# Patient Record
Sex: Female | Born: 1970
Health system: Southern US, Community
[De-identification: ages and names within clinical notes are randomized; demographics above are authoritative.]

## PROBLEM LIST (undated history)

## (undated) DIAGNOSIS — F329 Major depressive disorder, single episode, unspecified: Secondary | ICD-10-CM

## (undated) DIAGNOSIS — Z8669 Personal history of other diseases of the nervous system and sense organs: Secondary | ICD-10-CM

## (undated) DIAGNOSIS — K56609 Unspecified intestinal obstruction, unspecified as to partial versus complete obstruction: Secondary | ICD-10-CM

## (undated) DIAGNOSIS — I639 Cerebral infarction, unspecified: Secondary | ICD-10-CM

## (undated) DIAGNOSIS — R0602 Shortness of breath: Secondary | ICD-10-CM

## (undated) DIAGNOSIS — N189 Chronic kidney disease, unspecified: Secondary | ICD-10-CM

## (undated) DIAGNOSIS — R51 Headache: Secondary | ICD-10-CM

## (undated) DIAGNOSIS — I10 Essential (primary) hypertension: Secondary | ICD-10-CM

## (undated) DIAGNOSIS — G8929 Other chronic pain: Secondary | ICD-10-CM

## (undated) DIAGNOSIS — R600 Localized edema: Secondary | ICD-10-CM

## (undated) DIAGNOSIS — N809 Endometriosis, unspecified: Secondary | ICD-10-CM

## (undated) DIAGNOSIS — G4730 Sleep apnea, unspecified: Secondary | ICD-10-CM

## (undated) DIAGNOSIS — M79604 Pain in right leg: Secondary | ICD-10-CM

## (undated) DIAGNOSIS — K432 Incisional hernia without obstruction or gangrene: Principal | ICD-10-CM

## (undated) DIAGNOSIS — D649 Anemia, unspecified: Secondary | ICD-10-CM

## (undated) DIAGNOSIS — Z91018 Allergy to other foods: Secondary | ICD-10-CM

## (undated) DIAGNOSIS — E785 Hyperlipidemia, unspecified: Secondary | ICD-10-CM

## (undated) DIAGNOSIS — Z993 Dependence on wheelchair: Secondary | ICD-10-CM

## (undated) DIAGNOSIS — K439 Ventral hernia without obstruction or gangrene: Secondary | ICD-10-CM

## (undated) DIAGNOSIS — F419 Anxiety disorder, unspecified: Secondary | ICD-10-CM

## (undated) DIAGNOSIS — K760 Fatty (change of) liver, not elsewhere classified: Secondary | ICD-10-CM

## (undated) DIAGNOSIS — F32A Depression, unspecified: Secondary | ICD-10-CM

## (undated) DIAGNOSIS — Z5189 Encounter for other specified aftercare: Secondary | ICD-10-CM

## (undated) DIAGNOSIS — G8110 Spastic hemiplegia affecting unspecified side: Secondary | ICD-10-CM

## (undated) DIAGNOSIS — IMO0001 Reserved for inherently not codable concepts without codable children: Secondary | ICD-10-CM

## (undated) DIAGNOSIS — M549 Dorsalgia, unspecified: Secondary | ICD-10-CM

## (undated) DIAGNOSIS — E1140 Type 2 diabetes mellitus with diabetic neuropathy, unspecified: Secondary | ICD-10-CM

## (undated) DIAGNOSIS — G2581 Restless legs syndrome: Secondary | ICD-10-CM

## (undated) DIAGNOSIS — K219 Gastro-esophageal reflux disease without esophagitis: Secondary | ICD-10-CM

## (undated) DIAGNOSIS — I6330 Cerebral infarction due to thrombosis of unspecified cerebral artery: Secondary | ICD-10-CM

## (undated) DIAGNOSIS — R531 Weakness: Secondary | ICD-10-CM

## (undated) DIAGNOSIS — F410 Panic disorder [episodic paroxysmal anxiety] without agoraphobia: Secondary | ICD-10-CM

## (undated) DIAGNOSIS — Z9889 Other specified postprocedural states: Secondary | ICD-10-CM

## (undated) DIAGNOSIS — K5900 Constipation, unspecified: Secondary | ICD-10-CM

## (undated) DIAGNOSIS — T7840XA Allergy, unspecified, initial encounter: Secondary | ICD-10-CM

## (undated) DIAGNOSIS — M75101 Unspecified rotator cuff tear or rupture of right shoulder, not specified as traumatic: Secondary | ICD-10-CM

## (undated) DIAGNOSIS — K319 Disease of stomach and duodenum, unspecified: Secondary | ICD-10-CM

## (undated) DIAGNOSIS — R21 Rash and other nonspecific skin eruption: Secondary | ICD-10-CM

## (undated) DIAGNOSIS — Z87898 Personal history of other specified conditions: Secondary | ICD-10-CM

## (undated) HISTORY — PX: CARDIAC CATHETERIZATION: SHX172

## (undated) HISTORY — PX: EYE SURGERY: SHX253

## (undated) HISTORY — DX: Fatty (change of) liver, not elsewhere classified: K76.0

## (undated) HISTORY — DX: Weakness: R53.1

## (undated) HISTORY — DX: Personal history of other diseases of the nervous system and sense organs: Z86.69

## (undated) HISTORY — DX: Major depressive disorder, single episode, unspecified: F32.9

## (undated) HISTORY — DX: Localized edema: R60.0

## (undated) HISTORY — DX: Gastro-esophageal reflux disease without esophagitis: K21.9

## (undated) HISTORY — DX: Shortness of breath: R06.02

## (undated) HISTORY — DX: Other specified postprocedural states: Z98.890

## (undated) HISTORY — PX: COLONOSCOPY: SHX174

## (undated) HISTORY — PX: UTERINE FIBROID SURGERY: SHX826

## (undated) HISTORY — DX: Encounter for other specified aftercare: Z51.89

## (undated) HISTORY — DX: Constipation, unspecified: K59.00

## (undated) HISTORY — DX: Unspecified intestinal obstruction, unspecified as to partial versus complete obstruction: K56.609

## (undated) HISTORY — DX: Personal history of other specified conditions: Z87.898

## (undated) HISTORY — PX: UPPER GASTROINTESTINAL ENDOSCOPY: SHX188

## (undated) HISTORY — DX: Essential (primary) hypertension: I10

## (undated) HISTORY — DX: Cerebral infarction, unspecified: I63.9

## (undated) HISTORY — DX: Allergy to other foods: Z91.018

## (undated) HISTORY — DX: Depression, unspecified: F32.A

## (undated) HISTORY — DX: Anxiety disorder, unspecified: F41.9

## (undated) HISTORY — DX: Panic disorder (episodic paroxysmal anxiety): F41.0

## (undated) HISTORY — DX: Incisional hernia without obstruction or gangrene: K43.2

## (undated) HISTORY — DX: Cerebral infarction due to thrombosis of unspecified cerebral artery: I63.30

## (undated) HISTORY — PX: DIAGNOSTIC LAPAROSCOPY: SUR761

## (undated) HISTORY — PX: URETER REVISION: SHX493

## (undated) HISTORY — DX: Reserved for inherently not codable concepts without codable children: IMO0001

## (undated) HISTORY — DX: Anemia, unspecified: D64.9

## (undated) HISTORY — DX: Disease of stomach and duodenum, unspecified: K31.9

## (undated) HISTORY — DX: Spastic hemiplegia affecting unspecified side: G81.10

## (undated) HISTORY — DX: Allergy, unspecified, initial encounter: T78.40XA

---

## 2004-03-06 ENCOUNTER — Other Ambulatory Visit: Admission: RE | Admit: 2004-03-06 | Discharge: 2004-03-06 | Payer: Self-pay | Admitting: Obstetrics and Gynecology

## 2004-04-04 ENCOUNTER — Emergency Department (HOSPITAL_COMMUNITY): Admission: EM | Admit: 2004-04-04 | Discharge: 2004-04-04 | Payer: Self-pay | Admitting: Emergency Medicine

## 2004-04-06 ENCOUNTER — Inpatient Hospital Stay (HOSPITAL_COMMUNITY): Admission: EM | Admit: 2004-04-06 | Discharge: 2004-04-08 | Payer: Self-pay | Admitting: Emergency Medicine

## 2004-04-08 ENCOUNTER — Encounter (INDEPENDENT_AMBULATORY_CARE_PROVIDER_SITE_OTHER): Payer: Self-pay | Admitting: Cardiology

## 2004-11-21 ENCOUNTER — Emergency Department (HOSPITAL_COMMUNITY): Admission: EM | Admit: 2004-11-21 | Discharge: 2004-11-21 | Payer: Self-pay | Admitting: Emergency Medicine

## 2004-11-21 ENCOUNTER — Encounter: Admission: RE | Admit: 2004-11-21 | Discharge: 2005-02-19 | Payer: Self-pay | Admitting: Endocrinology

## 2004-12-03 ENCOUNTER — Ambulatory Visit (HOSPITAL_COMMUNITY): Admission: RE | Admit: 2004-12-03 | Discharge: 2004-12-03 | Payer: Self-pay | Admitting: *Deleted

## 2004-12-17 ENCOUNTER — Ambulatory Visit: Payer: Self-pay

## 2004-12-17 ENCOUNTER — Encounter: Payer: Self-pay | Admitting: Cardiovascular Disease

## 2005-07-26 ENCOUNTER — Emergency Department: Payer: Self-pay | Admitting: Emergency Medicine

## 2006-01-05 ENCOUNTER — Other Ambulatory Visit: Payer: Self-pay

## 2006-01-05 ENCOUNTER — Emergency Department: Payer: Self-pay | Admitting: Emergency Medicine

## 2006-04-20 ENCOUNTER — Ambulatory Visit (HOSPITAL_COMMUNITY): Admission: RE | Admit: 2006-04-20 | Discharge: 2006-04-20 | Payer: Self-pay | Admitting: Gastroenterology

## 2006-05-14 ENCOUNTER — Emergency Department: Payer: Self-pay | Admitting: Unknown Physician Specialty

## 2006-06-04 ENCOUNTER — Encounter: Admission: RE | Admit: 2006-06-04 | Discharge: 2006-06-04 | Payer: Self-pay | Admitting: Endocrinology

## 2006-08-16 ENCOUNTER — Other Ambulatory Visit: Payer: Self-pay

## 2006-08-16 ENCOUNTER — Emergency Department: Payer: Self-pay

## 2006-09-30 ENCOUNTER — Emergency Department: Payer: Self-pay | Admitting: Emergency Medicine

## 2007-01-17 ENCOUNTER — Other Ambulatory Visit: Payer: Self-pay

## 2007-01-17 ENCOUNTER — Emergency Department: Payer: Self-pay | Admitting: Emergency Medicine

## 2007-06-05 ENCOUNTER — Emergency Department: Payer: Self-pay | Admitting: Emergency Medicine

## 2007-07-28 ENCOUNTER — Inpatient Hospital Stay (HOSPITAL_COMMUNITY): Admission: AD | Admit: 2007-07-28 | Discharge: 2007-07-28 | Payer: Self-pay | Admitting: Gynecology

## 2007-09-25 ENCOUNTER — Inpatient Hospital Stay (HOSPITAL_COMMUNITY): Admission: AD | Admit: 2007-09-25 | Discharge: 2007-09-25 | Payer: Self-pay | Admitting: Obstetrics and Gynecology

## 2007-12-23 ENCOUNTER — Emergency Department: Payer: Self-pay | Admitting: Emergency Medicine

## 2008-01-19 ENCOUNTER — Encounter: Admission: RE | Admit: 2008-01-19 | Discharge: 2008-01-19 | Payer: Self-pay | Admitting: Endocrinology

## 2008-01-20 ENCOUNTER — Inpatient Hospital Stay (HOSPITAL_COMMUNITY): Admission: AD | Admit: 2008-01-20 | Discharge: 2008-01-20 | Payer: Self-pay | Admitting: Obstetrics and Gynecology

## 2008-04-28 ENCOUNTER — Other Ambulatory Visit: Payer: Self-pay | Admitting: Obstetrics

## 2008-04-28 ENCOUNTER — Emergency Department (HOSPITAL_COMMUNITY): Admission: EM | Admit: 2008-04-28 | Discharge: 2008-04-29 | Payer: Self-pay | Admitting: Emergency Medicine

## 2008-07-08 ENCOUNTER — Emergency Department: Payer: Self-pay | Admitting: Emergency Medicine

## 2009-03-08 ENCOUNTER — Emergency Department (HOSPITAL_COMMUNITY): Admission: EM | Admit: 2009-03-08 | Discharge: 2009-03-09 | Payer: Self-pay | Admitting: Emergency Medicine

## 2009-06-13 DIAGNOSIS — I6330 Cerebral infarction due to thrombosis of unspecified cerebral artery: Secondary | ICD-10-CM

## 2009-06-13 HISTORY — DX: Cerebral infarction due to thrombosis of unspecified cerebral artery: I63.30

## 2009-06-16 ENCOUNTER — Ambulatory Visit: Payer: Self-pay | Admitting: Internal Medicine

## 2009-06-16 ENCOUNTER — Inpatient Hospital Stay (HOSPITAL_COMMUNITY): Admission: EM | Admit: 2009-06-16 | Discharge: 2009-06-21 | Payer: Self-pay | Admitting: Emergency Medicine

## 2009-06-16 ENCOUNTER — Encounter (INDEPENDENT_AMBULATORY_CARE_PROVIDER_SITE_OTHER): Payer: Self-pay | Admitting: Internal Medicine

## 2009-06-18 ENCOUNTER — Encounter (INDEPENDENT_AMBULATORY_CARE_PROVIDER_SITE_OTHER): Payer: Self-pay | Admitting: Internal Medicine

## 2009-06-18 ENCOUNTER — Encounter (INDEPENDENT_AMBULATORY_CARE_PROVIDER_SITE_OTHER): Payer: Self-pay | Admitting: Neurology

## 2009-06-18 ENCOUNTER — Ambulatory Visit: Payer: Self-pay | Admitting: Physical Medicine & Rehabilitation

## 2009-06-19 ENCOUNTER — Encounter (INDEPENDENT_AMBULATORY_CARE_PROVIDER_SITE_OTHER): Payer: Self-pay | Admitting: Neurology

## 2009-06-20 ENCOUNTER — Encounter (INDEPENDENT_AMBULATORY_CARE_PROVIDER_SITE_OTHER): Payer: Self-pay | Admitting: Neurology

## 2009-06-21 ENCOUNTER — Inpatient Hospital Stay (HOSPITAL_COMMUNITY)
Admission: RE | Admit: 2009-06-21 | Discharge: 2009-06-28 | Payer: Self-pay | Admitting: Physical Medicine & Rehabilitation

## 2009-06-21 ENCOUNTER — Encounter: Payer: Self-pay | Admitting: Internal Medicine

## 2009-06-21 ENCOUNTER — Ambulatory Visit: Payer: Self-pay | Admitting: Physical Medicine & Rehabilitation

## 2009-07-02 ENCOUNTER — Encounter
Admission: RE | Admit: 2009-07-02 | Discharge: 2009-09-30 | Payer: Self-pay | Admitting: Physical Medicine & Rehabilitation

## 2009-07-26 ENCOUNTER — Encounter
Admission: RE | Admit: 2009-07-26 | Discharge: 2009-10-24 | Payer: Self-pay | Admitting: Physical Medicine & Rehabilitation

## 2009-07-30 ENCOUNTER — Encounter
Admission: RE | Admit: 2009-07-30 | Discharge: 2009-10-12 | Payer: Self-pay | Admitting: Physical Medicine & Rehabilitation

## 2009-08-06 ENCOUNTER — Ambulatory Visit: Payer: Self-pay | Admitting: Physical Medicine & Rehabilitation

## 2009-08-09 ENCOUNTER — Emergency Department (HOSPITAL_COMMUNITY): Admission: EM | Admit: 2009-08-09 | Discharge: 2009-08-10 | Payer: Self-pay | Admitting: Emergency Medicine

## 2009-09-04 ENCOUNTER — Ambulatory Visit: Payer: Self-pay | Admitting: Physical Medicine & Rehabilitation

## 2009-10-02 ENCOUNTER — Ambulatory Visit: Payer: Self-pay | Admitting: Physical Medicine & Rehabilitation

## 2009-10-06 ENCOUNTER — Inpatient Hospital Stay (HOSPITAL_COMMUNITY): Admission: AD | Admit: 2009-10-06 | Discharge: 2009-10-06 | Payer: Self-pay | Admitting: Obstetrics and Gynecology

## 2009-10-06 DIAGNOSIS — O209 Hemorrhage in early pregnancy, unspecified: Secondary | ICD-10-CM

## 2009-11-11 ENCOUNTER — Emergency Department (HOSPITAL_COMMUNITY): Admission: EM | Admit: 2009-11-11 | Discharge: 2009-11-11 | Payer: Self-pay | Admitting: Emergency Medicine

## 2009-11-29 ENCOUNTER — Inpatient Hospital Stay (HOSPITAL_COMMUNITY): Admission: AD | Admit: 2009-11-29 | Discharge: 2009-11-29 | Payer: Self-pay | Admitting: Obstetrics & Gynecology

## 2009-12-19 ENCOUNTER — Encounter
Admission: RE | Admit: 2009-12-19 | Discharge: 2010-02-12 | Payer: Self-pay | Source: Home / Self Care | Attending: Physical Medicine & Rehabilitation | Admitting: Physical Medicine & Rehabilitation

## 2009-12-25 ENCOUNTER — Ambulatory Visit: Payer: Self-pay | Admitting: Physical Medicine & Rehabilitation

## 2010-01-30 ENCOUNTER — Encounter
Admission: RE | Admit: 2010-01-30 | Discharge: 2010-02-12 | Payer: Self-pay | Source: Home / Self Care | Attending: Physical Medicine & Rehabilitation | Admitting: Physical Medicine & Rehabilitation

## 2010-02-05 ENCOUNTER — Ambulatory Visit
Admission: RE | Admit: 2010-02-05 | Discharge: 2010-02-05 | Payer: Self-pay | Source: Home / Self Care | Attending: Physical Medicine & Rehabilitation | Admitting: Physical Medicine & Rehabilitation

## 2010-02-08 ENCOUNTER — Inpatient Hospital Stay (HOSPITAL_COMMUNITY)
Admission: AD | Admit: 2010-02-08 | Discharge: 2010-02-08 | Payer: Self-pay | Source: Home / Self Care | Attending: Obstetrics and Gynecology | Admitting: Obstetrics and Gynecology

## 2010-02-08 LAB — URINE MICROSCOPIC-ADD ON

## 2010-02-08 LAB — URINALYSIS, ROUTINE W REFLEX MICROSCOPIC
Hgb urine dipstick: NEGATIVE
Leukocytes, UA: NEGATIVE
Specific Gravity, Urine: >1.03 — ABNORMAL HIGH (ref 1.005–1.030)
Urine Glucose, Fasting: 250 mg/dL — AB
Urobilinogen, UA: 0.2 mg/dL (ref 0.0–1.0)

## 2010-02-14 ENCOUNTER — Inpatient Hospital Stay (HOSPITAL_COMMUNITY)
Admission: AD | Admit: 2010-02-14 | Discharge: 2010-02-14 | Disposition: A | Discharge status: Home / Self Care | Payer: Federal, State, Local not specified - PPO | Source: Ambulatory Visit | Attending: Family Medicine | Admitting: Family Medicine

## 2010-02-14 DIAGNOSIS — T783XXA Angioneurotic edema, initial encounter: Secondary | ICD-10-CM

## 2010-02-14 DIAGNOSIS — R809 Proteinuria, unspecified: Secondary | ICD-10-CM

## 2010-02-14 DIAGNOSIS — R821 Myoglobinuria: Secondary | ICD-10-CM

## 2010-02-14 DIAGNOSIS — E119 Type 2 diabetes mellitus without complications: Secondary | ICD-10-CM

## 2010-02-14 DIAGNOSIS — I10 Essential (primary) hypertension: Secondary | ICD-10-CM

## 2010-02-14 DIAGNOSIS — X58XXXA Exposure to other specified factors, initial encounter: Secondary | ICD-10-CM | POA: Insufficient documentation

## 2010-03-08 ENCOUNTER — Encounter: Payer: Federal, State, Local not specified - PPO | Attending: Physical Medicine & Rehabilitation

## 2010-03-08 ENCOUNTER — Ambulatory Visit: Payer: Federal, State, Local not specified - PPO | Admitting: Physical Medicine & Rehabilitation

## 2010-03-08 DIAGNOSIS — F3289 Other specified depressive episodes: Secondary | ICD-10-CM | POA: Insufficient documentation

## 2010-03-08 DIAGNOSIS — I6330 Cerebral infarction due to thrombosis of unspecified cerebral artery: Secondary | ICD-10-CM

## 2010-03-08 DIAGNOSIS — G8110 Spastic hemiplegia affecting unspecified side: Secondary | ICD-10-CM

## 2010-03-08 DIAGNOSIS — R209 Unspecified disturbances of skin sensation: Secondary | ICD-10-CM

## 2010-03-08 DIAGNOSIS — M62838 Other muscle spasm: Secondary | ICD-10-CM | POA: Insufficient documentation

## 2010-03-08 DIAGNOSIS — K5900 Constipation, unspecified: Secondary | ICD-10-CM | POA: Insufficient documentation

## 2010-03-08 DIAGNOSIS — F329 Major depressive disorder, single episode, unspecified: Secondary | ICD-10-CM | POA: Insufficient documentation

## 2010-03-08 DIAGNOSIS — E785 Hyperlipidemia, unspecified: Secondary | ICD-10-CM | POA: Insufficient documentation

## 2010-03-08 DIAGNOSIS — E119 Type 2 diabetes mellitus without complications: Secondary | ICD-10-CM | POA: Insufficient documentation

## 2010-03-08 DIAGNOSIS — R61 Generalized hyperhidrosis: Secondary | ICD-10-CM | POA: Insufficient documentation

## 2010-03-08 DIAGNOSIS — I69959 Hemiplegia and hemiparesis following unspecified cerebrovascular disease affecting unspecified side: Secondary | ICD-10-CM | POA: Insufficient documentation

## 2010-03-26 LAB — COMPREHENSIVE METABOLIC PANEL
ALT: 12 U/L (ref 0–35)
AST: 14 U/L (ref 0–37)
Alkaline Phosphatase: 36 U/L — ABNORMAL LOW (ref 39–117)
CO2: 22 mEq/L (ref 19–32)
Chloride: 105 mEq/L (ref 96–112)
GFR calc Af Amer: >60 mL/min (ref >60)
GFR calc non Af Amer: >60 mL/min (ref >60)
Glucose, Bld: 174 mg/dL — ABNORMAL HIGH (ref 70–99)
Potassium: 3.7 mEq/L (ref 3.5–5.1)
Sodium: 135 mEq/L (ref 135–145)
Total Bilirubin: <0.3 mg/dL — ABNORMAL LOW (ref 0.3–1.2)

## 2010-03-26 LAB — CBC
HCT: 30.8 % — ABNORMAL LOW (ref 36.0–46.0)
Hemoglobin: 10.2 g/dL — ABNORMAL LOW (ref 12.0–15.0)
MCHC: 33.1 g/dL (ref 30.0–36.0)
RBC: 3.98 MIL/uL (ref 3.87–5.11)

## 2010-03-26 LAB — URINALYSIS, ROUTINE W REFLEX MICROSCOPIC
Bilirubin Urine: NEGATIVE
Ketones, ur: NEGATIVE mg/dL
Nitrite: NEGATIVE
pH: 6 (ref 5.0–8.0)

## 2010-03-26 LAB — URINE MICROSCOPIC-ADD ON

## 2010-03-27 LAB — POCT CARDIAC MARKERS
Myoglobin, poc: 33.5 ng/mL (ref 12–200)
Troponin i, poc: <0.05 ng/mL (ref 0.00–0.09)

## 2010-03-27 LAB — DIFFERENTIAL
Basophils Absolute: 0 10*3/uL (ref 0.0–0.1)
Basophils Relative: 0 % (ref 0–1)
Lymphocytes Relative: 19 % (ref 12–46)
Monocytes Absolute: 0.5 10*3/uL (ref 0.1–1.0)
Neutro Abs: 6.9 10*3/uL (ref 1.7–7.7)

## 2010-03-27 LAB — CBC
HCT: 31.2 % — ABNORMAL LOW (ref 36.0–46.0)
MCHC: 33.3 g/dL (ref 30.0–36.0)
Platelets: 316 10*3/uL (ref 150–400)
RDW: 18.5 % — ABNORMAL HIGH (ref 11.5–15.5)
WBC: 9.3 10*3/uL (ref 4.0–10.5)

## 2010-03-27 LAB — POCT I-STAT, CHEM 8
BUN: 9 mg/dL (ref 6–23)
Calcium, Ion: 1.2 mmol/L (ref 1.12–1.32)
Chloride: 106 mEq/L (ref 96–112)
HCT: 33 % — ABNORMAL LOW (ref 36.0–46.0)
Sodium: 139 mEq/L (ref 135–145)
TCO2: 21 mmol/L (ref 0–100)

## 2010-03-28 LAB — CBC
HCT: 31.9 % — ABNORMAL LOW (ref 36.0–46.0)
MCHC: 32.7 g/dL (ref 30.0–36.0)
MCV: 74.1 fL — ABNORMAL LOW (ref 78.0–100.0)
Platelets: 318 10*3/uL (ref 150–400)
RDW: 19.3 % — ABNORMAL HIGH (ref 11.5–15.5)

## 2010-03-30 LAB — URINALYSIS, ROUTINE W REFLEX MICROSCOPIC
Glucose, UA: 100 mg/dL — AB
Ketones, ur: NEGATIVE mg/dL
Protein, ur: NEGATIVE mg/dL
Urobilinogen, UA: 0.2 mg/dL (ref 0.0–1.0)

## 2010-03-30 LAB — POCT PREGNANCY, URINE: Preg Test, Ur: NEGATIVE

## 2010-03-30 LAB — CBC
MCH: 22.9 pg — ABNORMAL LOW (ref 26.0–34.0)
MCHC: 32.5 g/dL (ref 30.0–36.0)
MCV: 70.6 fL — ABNORMAL LOW (ref 78.0–100.0)
Platelets: 375 10*3/uL (ref 150–400)
RDW: 19 % — ABNORMAL HIGH (ref 11.5–15.5)

## 2010-03-30 LAB — BASIC METABOLIC PANEL
BUN: 8 mg/dL (ref 6–23)
CO2: 24 mEq/L (ref 19–32)
Calcium: 8.6 mg/dL (ref 8.4–10.5)
Chloride: 103 mEq/L (ref 96–112)
Creatinine, Ser: 0.6 mg/dL (ref 0.4–1.2)

## 2010-03-30 LAB — DIFFERENTIAL
Basophils Absolute: 0.1 10*3/uL (ref 0.0–0.1)
Basophils Relative: 1 % (ref 0–1)
Eosinophils Absolute: 0.5 10*3/uL (ref 0.0–0.7)
Eosinophils Relative: 6 % — ABNORMAL HIGH (ref 0–5)

## 2010-03-30 LAB — GLUCOSE, CAPILLARY: Glucose-Capillary: 193 mg/dL — ABNORMAL HIGH (ref 70–99)

## 2010-03-31 LAB — GLUCOSE, CAPILLARY: Glucose-Capillary: 168 mg/dL — ABNORMAL HIGH (ref 70–99)

## 2010-04-01 LAB — ANTITHROMBIN III: AntiThromb III Func: 110 % (ref 76–126)

## 2010-04-01 LAB — URINALYSIS, ROUTINE W REFLEX MICROSCOPIC
Bilirubin Urine: NEGATIVE
Glucose, UA: 250 mg/dL — AB
Hgb urine dipstick: NEGATIVE
Ketones, ur: NEGATIVE mg/dL
Leukocytes, UA: NEGATIVE
Protein, ur: 100 mg/dL — AB
pH: 6.5 (ref 5.0–8.0)

## 2010-04-01 LAB — COMPREHENSIVE METABOLIC PANEL
ALT: 11 U/L (ref 0–35)
ALT: 15 U/L (ref 0–35)
AST: 14 U/L (ref 0–37)
AST: 19 U/L (ref 0–37)
AST: 31 U/L (ref 0–37)
Albumin: 2.8 g/dL — ABNORMAL LOW (ref 3.5–5.2)
Albumin: 3 g/dL — ABNORMAL LOW (ref 3.5–5.2)
Albumin: 3.1 g/dL — ABNORMAL LOW (ref 3.5–5.2)
Alkaline Phosphatase: 45 U/L (ref 39–117)
Alkaline Phosphatase: 52 U/L (ref 39–117)
BUN: 12 mg/dL (ref 6–23)
Calcium: 7.9 mg/dL — ABNORMAL LOW (ref 8.4–10.5)
Chloride: 108 mEq/L (ref 96–112)
Chloride: 108 mEq/L (ref 96–112)
Creatinine, Ser: 0.5 mg/dL (ref 0.4–1.2)
GFR calc Af Amer: >60 mL/min (ref >60)
GFR calc Af Amer: >60 mL/min (ref >60)
Glucose, Bld: 208 mg/dL — ABNORMAL HIGH (ref 70–99)
Potassium: 3.6 mEq/L (ref 3.5–5.1)
Potassium: 4.1 mEq/L (ref 3.5–5.1)
Potassium: 4.4 mEq/L (ref 3.5–5.1)
Sodium: 137 mEq/L (ref 135–145)
Total Bilirubin: 0.4 mg/dL (ref 0.3–1.2)
Total Protein: 6.4 g/dL (ref 6.0–8.3)
Total Protein: 6.7 g/dL (ref 6.0–8.3)

## 2010-04-01 LAB — CBC
HCT: 31.6 % — ABNORMAL LOW (ref 36.0–46.0)
Hemoglobin: 10 g/dL — ABNORMAL LOW (ref 12.0–15.0)
MCHC: 31.4 g/dL (ref 30.0–36.0)
Platelets: 376 10*3/uL (ref 150–400)
RDW: 19.7 % — ABNORMAL HIGH (ref 11.5–15.5)
WBC: 7.8 10*3/uL (ref 4.0–10.5)

## 2010-04-01 LAB — GLUCOSE, CAPILLARY
Glucose-Capillary: 149 mg/dL — ABNORMAL HIGH (ref 70–99)
Glucose-Capillary: 190 mg/dL — ABNORMAL HIGH (ref 70–99)
Glucose-Capillary: 197 mg/dL — ABNORMAL HIGH (ref 70–99)
Glucose-Capillary: 198 mg/dL — ABNORMAL HIGH (ref 70–99)
Glucose-Capillary: 206 mg/dL — ABNORMAL HIGH (ref 70–99)
Glucose-Capillary: 212 mg/dL — ABNORMAL HIGH (ref 70–99)
Glucose-Capillary: 214 mg/dL — ABNORMAL HIGH (ref 70–99)
Glucose-Capillary: 224 mg/dL — ABNORMAL HIGH (ref 70–99)
Glucose-Capillary: 228 mg/dL — ABNORMAL HIGH (ref 70–99)
Glucose-Capillary: 229 mg/dL — ABNORMAL HIGH (ref 70–99)
Glucose-Capillary: 234 mg/dL — ABNORMAL HIGH (ref 70–99)
Glucose-Capillary: 235 mg/dL — ABNORMAL HIGH (ref 70–99)
Glucose-Capillary: 238 mg/dL — ABNORMAL HIGH (ref 70–99)
Glucose-Capillary: 242 mg/dL — ABNORMAL HIGH (ref 70–99)
Glucose-Capillary: 249 mg/dL — ABNORMAL HIGH (ref 70–99)
Glucose-Capillary: 249 mg/dL — ABNORMAL HIGH (ref 70–99)
Glucose-Capillary: 250 mg/dL — ABNORMAL HIGH (ref 70–99)
Glucose-Capillary: 253 mg/dL — ABNORMAL HIGH (ref 70–99)
Glucose-Capillary: 276 mg/dL — ABNORMAL HIGH (ref 70–99)
Glucose-Capillary: 280 mg/dL — ABNORMAL HIGH (ref 70–99)
Glucose-Capillary: 290 mg/dL — ABNORMAL HIGH (ref 70–99)
Glucose-Capillary: 291 mg/dL — ABNORMAL HIGH (ref 70–99)
Glucose-Capillary: 305 mg/dL — ABNORMAL HIGH (ref 70–99)
Glucose-Capillary: 249 mg/dL — ABNORMAL HIGH (ref 70–99)

## 2010-04-01 LAB — SEDIMENTATION RATE: Sed Rate: 33 mm/hr — ABNORMAL HIGH (ref 0–22)

## 2010-04-01 LAB — RETICULOCYTES
Retic Count, Absolute: 68.9 10*3/uL (ref 19.0–186.0)
Retic Ct Pct: 1.5 % (ref 0.4–3.1)

## 2010-04-01 LAB — LIPID PANEL
Cholesterol: 225 mg/dL — ABNORMAL HIGH (ref 0–200)
LDL Cholesterol: 145 mg/dL — ABNORMAL HIGH (ref 0–99)
Total CHOL/HDL Ratio: 4.6 RATIO

## 2010-04-01 LAB — BASIC METABOLIC PANEL
CO2: 23 mEq/L (ref 19–32)
Calcium: 9 mg/dL (ref 8.4–10.5)
Creatinine, Ser: 0.68 mg/dL (ref 0.4–1.2)
GFR calc Af Amer: >60 mL/min (ref >60)
GFR calc non Af Amer: >60 mL/min (ref >60)
Glucose, Bld: 261 mg/dL — ABNORMAL HIGH (ref 70–99)
Potassium: 4.2 mEq/L (ref 3.5–5.1)
Sodium: 136 mEq/L (ref 135–145)

## 2010-04-01 LAB — RAPID URINE DRUG SCREEN, HOSP PERFORMED
Amphetamines: NOT DETECTED
Benzodiazepines: POSITIVE — AB
Cocaine: NOT DETECTED
Opiates: NOT DETECTED
Tetrahydrocannabinol: NOT DETECTED

## 2010-04-01 LAB — LUPUS ANTICOAGULANT PANEL: DRVVT: 39.8 secs (ref 36.2–44.3)

## 2010-04-01 LAB — COMPLEMENT, TOTAL: Compl, Total (CH50): 60 U/mL (ref 31–60)

## 2010-04-01 LAB — VITAMIN B12: Vitamin B-12: 289 pg/mL (ref 211–911)

## 2010-04-01 LAB — CARDIAC PANEL(CRET KIN+CKTOT+MB+TROPI)
Relative Index: INVALID (ref 0.0–2.5)
Troponin I: 0.02 ng/mL (ref 0.00–0.06)

## 2010-04-01 LAB — DIFFERENTIAL
Basophils Relative: 1 % (ref 0–1)
Eosinophils Absolute: 0.2 10*3/uL (ref 0.0–0.7)
Lymphocytes Relative: 27 % (ref 12–46)
Lymphs Abs: 1.8 10*3/uL (ref 0.7–4.0)
Neutro Abs: 3.8 10*3/uL (ref 1.7–7.7)

## 2010-04-01 LAB — FACTOR 5 LEIDEN

## 2010-04-01 LAB — CARDIOLIPIN ANTIBODIES, IGG, IGM, IGA
Anticardiolipin IgA: 8 APL U/mL — ABNORMAL LOW (ref <22)
Anticardiolipin IgG: 3 GPL U/mL — ABNORMAL LOW (ref <23)

## 2010-04-01 LAB — HOMOCYSTEINE: Homocysteine: 7.9 umol/L (ref 4.0–15.4)

## 2010-04-01 LAB — PROTIME-INR: Prothrombin Time: 13.4 seconds (ref 11.6–15.2)

## 2010-04-01 LAB — C3 COMPLEMENT: C3 Complement: 185 mg/dL (ref 88–201)

## 2010-04-01 LAB — PROTEIN S, TOTAL: Protein S Ag, Total: 119 % (ref 70–140)

## 2010-04-01 LAB — IRON AND TIBC
Saturation Ratios: 10 % — ABNORMAL LOW (ref 20–55)
TIBC: 319 ug/dL (ref 250–470)
UIBC: 288 ug/dL

## 2010-04-01 LAB — FOLATE: Folate: >20 ng/mL

## 2010-04-01 LAB — HEMOGLOBIN A1C: Hgb A1c MFr Bld: 11.7 % — ABNORMAL HIGH (ref <5.7)

## 2010-04-01 LAB — ANA: Anti Nuclear Antibody(ANA): NEGATIVE

## 2010-04-01 LAB — C4 COMPLEMENT: Complement C4, Body Fluid: 46 mg/dL (ref 16–47)

## 2010-04-05 LAB — GLUCOSE, CAPILLARY

## 2010-04-05 LAB — DIFFERENTIAL
Lymphs Abs: 0.4 10*3/uL — ABNORMAL LOW (ref 0.7–4.0)
Monocytes Relative: 3 % (ref 3–12)
Neutro Abs: 11.2 10*3/uL — ABNORMAL HIGH (ref 1.7–7.7)
Neutrophils Relative %: 94 % — ABNORMAL HIGH (ref 43–77)

## 2010-04-05 LAB — CBC
MCV: 72.5 fL — ABNORMAL LOW (ref 78.0–100.0)
RBC: 4.73 MIL/uL (ref 3.87–5.11)
WBC: 11.9 10*3/uL — ABNORMAL HIGH (ref 4.0–10.5)

## 2010-04-05 LAB — BASIC METABOLIC PANEL
Calcium: 8.8 mg/dL (ref 8.4–10.5)
Chloride: 103 mEq/L (ref 96–112)
Creatinine, Ser: 0.7 mg/dL (ref 0.4–1.2)
GFR calc Af Amer: >60 mL/min (ref >60)

## 2010-04-05 NOTE — Assessment & Plan Note (Signed)
REASON FOR VISIT:  Balance problems, right-sided weakness, spasms in the right lower extremity.  A 40 year old female, prior history of diabetes, dyslipidemia, was admitted to [facility] with right-sided weakness, slurred speech on June 16, 2009.  She had a left pontine infarct.  She went through inpatient rehabilitation as well as home health therapy as well as outpatient PT and OT.  She is pregnant.  Went into preterm labor and delivered at 25 weeks a 1 pound and 8 ounces neonate.  She had some increased weakness on the right side after her delivery; however, repeat MRI of the brain showed no new strokes.  She was at bedrest for about 2 weeks after her C- section.  Average pain is 4/5, right hemibody tingling, aching, dull pain.  She can walk 10 minutes at a time.  She climbs steps.  She does not drive.  She needs help with bathing, household duties and shopping, otherwise independent.  REVIEW OF SYSTEMS:  Positive of weakness, numbness, spasms, depression, constipation, poor appetite and night sweats.  SOCIAL HISTORY:  Married, lives with her husband.  She is breast-feeding a neonate.  She is doing pumping.  She has other children at home.  PHYSICAL EXAMINATION:  Blood pressure 139/75, respirations 18, pulse 97, O2 sat 100% on room air.  Overweight female in no acute distress. Orientation x3.  Affect is alert.  Gait; she has a slightly widened base support.  She cannot do toe walk or heel walk, but that is mainly due to balance rather than strength deficits in the leg muscles.  She has decreased fine motor in the right upper extremity with decreased finger-to-thumb opposition.  She has 4/5 strength in the right deltoid, biceps, triceps grip, 5/5 of the left side.  In the right lower extremity, she has 4/5 in the hip flexor, knee extensor and ankle dorsiflexor and 5/5 on the left side.  Sensation is reduced on the right side to pinprick in the upper and lower extremities.  She has  positive dysdiadochokinesis on rapid alternating, supination and pronation of the right upper extremity.  She has no evidence of ataxia on finger-nose-finger testing.  IMPRESSION:  Left pontine infarct with right hemiparesis.  She has spasticity in the right quad, which is intermittent.  Because of breast- feeding, we cannot her start on antispasticity medications.  This is not a good location for Botox injections either.  We can instruct her in a quad stretching program.  I will see her back in 4 months.  Encouraged her to keep up with home exercise program as well as a walking activities.  Discussed with the patient and her husband, agreed with plan.     Erick Colace, M.D. Electronically Signed    AEK/MedQ D:  03/08/2010 16:26:38  T:  03/08/2010 21:35:08  Job #:  [id number]

## 2010-04-24 LAB — POCT CARDIAC MARKERS
Myoglobin, poc: 40.6 ng/mL (ref 12–200)
Troponin i, poc: <0.05 ng/mL (ref 0.00–0.09)

## 2010-04-24 LAB — CBC
HCT: 34.4 % — ABNORMAL LOW (ref 36.0–46.0)
Hemoglobin: 11.1 g/dL — ABNORMAL LOW (ref 12.0–15.0)
MCV: 74.4 fL — ABNORMAL LOW (ref 78.0–100.0)
RBC: 4.62 MIL/uL (ref 3.87–5.11)
WBC: 6.6 10*3/uL (ref 4.0–10.5)

## 2010-04-24 LAB — POCT I-STAT, CHEM 8
BUN: 9 mg/dL (ref 6–23)
Calcium, Ion: 1.18 mmol/L (ref 1.12–1.32)
Creatinine, Ser: 0.6 mg/dL (ref 0.4–1.2)
TCO2: 24 mmol/L (ref 0–100)

## 2010-04-24 LAB — DIFFERENTIAL
Eosinophils Absolute: 0.2 10*3/uL (ref 0.0–0.7)
Eosinophils Relative: 2 % (ref 0–5)
Lymphs Abs: 2.1 10*3/uL (ref 0.7–4.0)
Monocytes Relative: 7 % (ref 3–12)

## 2010-04-24 LAB — GLUCOSE, CAPILLARY: Glucose-Capillary: 253 mg/dL — ABNORMAL HIGH (ref 70–99)

## 2010-04-29 LAB — URINALYSIS, ROUTINE W REFLEX MICROSCOPIC
Ketones, ur: NEGATIVE mg/dL
Leukocytes, UA: NEGATIVE
Nitrite: NEGATIVE
Urobilinogen, UA: 0.2 mg/dL (ref 0.0–1.0)
pH: 6.5 (ref 5.0–8.0)

## 2010-04-29 LAB — CBC
HCT: 33.1 % — ABNORMAL LOW (ref 36.0–46.0)
Hemoglobin: 10.6 g/dL — ABNORMAL LOW (ref 12.0–15.0)
MCHC: 32.1 g/dL (ref 30.0–36.0)
MCV: 71.7 fL — ABNORMAL LOW (ref 78.0–100.0)
Platelets: 422 10*3/uL — ABNORMAL HIGH (ref 150–400)
RDW: 17.4 % — ABNORMAL HIGH (ref 11.5–15.5)

## 2010-04-29 LAB — BASIC METABOLIC PANEL
BUN: 9 mg/dL (ref 6–23)
CO2: 24 mEq/L (ref 19–32)
Chloride: 103 mEq/L (ref 96–112)
GFR calc non Af Amer: >60 mL/min (ref >60)
Glucose, Bld: 327 mg/dL — ABNORMAL HIGH (ref 70–99)
Potassium: 4.3 mEq/L (ref 3.5–5.1)

## 2010-04-29 LAB — URINE MICROSCOPIC-ADD ON

## 2010-04-29 LAB — WET PREP, GENITAL: Clue Cells Wet Prep HPF POC: NONE SEEN

## 2010-05-31 NOTE — Op Note (Signed)
NAME:  Tina [name], Tina [name]            ACCOUNT NO.:  [id number]   MEDICAL RECORD NO.:  [id number]          PATIENT TYPE:  AMB   LOCATION:  ENDO                         FACILITY:  [facility]   PHYSICIAN:  Georgiana Spinner, M.D.    DATE OF BIRTH:  [birth year]   DATE OF PROCEDURE:  DATE OF DISCHARGE:                               OPERATIVE REPORT   PROCEDURE:  Colonoscopy.   INDICATIONS:  Rectal bleeding.   ANESTHESIA:  Demerol 40 mg, Versed 5 mg.   PROCEDURE:  With the patient mildly sedated in the left lateral  decubitus position, the Pentax videoscopic colonoscope was inserted in  the rectum and passed under direct vision to the cecum, identified by  the ileocecal valve and base of cecum, both of which were photographed.  From this point, the colonoscope was slowly withdrawn, taking  circumferential views of the colonic mucosa, suctioning yellow stool,  liquid in nature, from the colon as we proceeded until we reached the  rectum which appeared normal on direct and showed hemorrhoids on  retroflexed view.  The endoscope was straightened and withdrawn.  The  patient's vital signs and pulse oximeter remained stable.  The patient  tolerated the procedure well without apparent complications.   FINDINGS:  Internal hemorrhoids; otherwise, unremarkable examination.   PLAN:  Have patient follow up with me as an outpatient.           ______________________________  Georgiana Spinner, M.D.     GMO/MEDQ  D:  04/20/2006  T:  04/20/2006  Job:  [id number]

## 2010-05-31 NOTE — H&P (Signed)
NAMECYBIL, Tina [name]            ACCOUNT NO.:  [id number]   MEDICAL RECORD NO.:  [id number]          PATIENT TYPE:  INP   LOCATION:  5727                         FACILITY:  [facility]   PHYSICIAN:  Lonia Blood, M.D.      DATE OF BIRTH:  [birth year]   DATE OF ADMISSION:  04/05/2004  DATE OF DISCHARGE:                                HISTORY & PHYSICAL   PRIMARY CARE PHYSICIAN:  Dr. Juleen China.   PRESENTING COMPLAINT:  Fever, nausea, vomiting, and dysuria.   HISTORY OF PRESENT ILLNESS:  This is a 40 year old African American female  with a history of diabetes for more than ten years, a history of vesicle  ureteral reflux, status post repair almost ten years ago.  The patient  presented to the ED, on April 04, 2004, with fever and generalized malaise.  Workup showed the patient had evidence of UTI.  She was nauseated at the  time and was given oral antibiotics.  The patient went home and started  having vomiting.  She has been unable to keep the antibiotics down.  She has  also continued to be weak, so she returned to the emergency room on April 05, 2004 p.m.  The patient complained of having a fever of 106 at home also.  While in the ED, the patient was found to be weak and looks dehydrated.  She  is deemed to have failed outpatient antibiotic therapy.   PAST MEDICAL HISTORY:  1.  Diabetes type 2.  2.  Hypertension.  3.  History of endometriosis.  4.  History of fibroid uterus.  5.  History of obesity.  6.  History of vesicle ureteral reflux, status post repair.  7.  Dyslipidemia.  8.  Obesity.   ALLERGIES:  The patient is allergic to:  1.  SEAFOOD.  2.  IV DYE.   MEDICATIONS:  1.  Lotrel 10/20 one tablet daily.  2.  Actos 45 mg daily.  3.  Metoprolol 50 mg daily.  4.  Phentermine 30 mg daily for weight loss.   SOCIAL HISTORY:  The patient lives here in [place] with her husband.  Denied any tobacco or alcohol use.  The patient has been fairly active,  trying to lose weight.   She has lost 10 pounds in the past two weeks.  She  is currently undergoing physician assisted weight loss program.   FAMILY HISTORY:  Significant for heart disease in her grandparents and  diabetes in her father.   REVIEW OF SYSTEMS:  Essentially as in HPI.  Other systems reviewed and are  normal.   PHYSICAL EXAMINATION:  VITAL SIGNS:  Temperature is 103.4, blood pressure  146/95, pulse of 118.  GENERAL:  The patient is acutely ill looking but in no acute distress.  HEENT:  PERRL.  EOMI.  NECK:  Supple.  No JVD.  No lymphadenopathy.  CHEST:  Clear to auscultation bilaterally.  No wheezing.  No rales.  CARDIOVASCULAR:  The patient is slightly tachycardic.  ABDOMEN:  Obese, nontender with positive bowel sounds.  EXTREMITIES:  Show no edema, cyanosis, or clubbing.   Her labs showed  a white count of 8.9, hemoglobin 11.1 with an MCV of 75.8,  normal differential.  Sodium is 132, potassium 3.8, chloride 100, CO2 25,  glucose 260, BUN 7, creatinine 0.9, calcium 8.6.  Total protein 6.7, albumin  3.1.  AST 30, ALT 13, alkaline phosphatase 45, total bilirubin is 0.4.  Lipase is 20.  Urine culture and blood cultures are currently pending.   ASSESSMENT:  1.  This is a 40 year old female with possible pyelonephritis going by      fever, costovertebral angle tenderness, and evidence of pyuria from her      urinalysis.  The patient seems to have failed outpatient therapy.  We      will, therefore, admit the patient for at least 23-hour observation,      start her on some IV antibiotics and transition her to oral antibiotics.      The patient has not vomited since arrival at the ER, but she is still      nauseated.  We will control her nausea appropriately.  We will also      hydrate her to a place where she could be most stable.  2.  For her diabetes, I will check a hemoglobin A1c.  I will continue her on      Actos and add sliding scale insulin.  3.  For her hypertension, I will continue with  Lotrel and metoprolol as per      her home dose.  We will also cover her at this point with broad spectrum      with Cipro as well as Rocephin.  I will use Rocephin on top of the Cipro      pending the culture results.  This is because of the resistant isolates      of Cipro in this hospital.  If the patient improves within the next 23      hours, we will discharge the patient, otherwise the patient will have to      be converted into full admission after 23 hours.      LG/MEDQ  D:  04/06/2004  T:  04/07/2004  Job:  [id number]

## 2010-05-31 NOTE — Op Note (Signed)
NAME:  Tina [name], Tina [name]            ACCOUNT NO.:  [id number]   MEDICAL RECORD NO.:  [id number]          PATIENT TYPE:  AMB   LOCATION:  ENDO                         FACILITY:  [facility]   PHYSICIAN:  Georgiana Spinner, M.D.    DATE OF BIRTH:  1970/11/08   DATE OF PROCEDURE:  04/20/2006  DATE OF DISCHARGE:                               OPERATIVE REPORT   PROCEDURE:  Upper endoscopy.   INDICATIONS:  Abdominal pain.   ANESTHESIA:  Demerol 70 mg, Versed 7.5 mg.   PROCEDURE:  With patient mildly sedated in the left lateral decubitus  position, the Pentax videoscopic endoscope was inserted in the mouth,  passed under direct vision through the esophagus, which appeared normal  into the stomach, fundus, body, and antrum.  Duodenal bulb and second  portion of the duodenum all appeared normal.  From this point, the  endoscope was slowly withdrawn, taking circumferential views of the  duodenal mucosa until the endoscope had been pulled back into the  stomach, placed in retroflexion, and viewed the stomach from below.  The  endoscope was straightened and withdrawn, taking circumferential views  of the remaining gastric and esophageal mucosa.  The patient's vital  signs and pulse oximeter remained stable.  The patient tolerated the  procedure well without apparent complications.   FINDINGS:  Unremarkable examination.   PLAN:  Proceed to colonoscopy.           ______________________________  Georgiana Spinner, M.D.     GMO/MEDQ  D:  04/20/2006  T:  04/20/2006  Job:  [id number]

## 2010-05-31 NOTE — Op Note (Signed)
NAME:  Tina [name], Tina [name]            ACCOUNT NO.:  [id number]   MEDICAL RECORD NO.:  [id number]          PATIENT TYPE:  AMB   LOCATION:  ENDO                         FACILITY:  [facility]   PHYSICIAN:  Georgiana Spinner, M.D.    DATE OF BIRTH:  09/30/1970   DATE OF PROCEDURE:  12/03/2004  DATE OF DISCHARGE:                                 OPERATIVE REPORT   PROCEDURES:  Colonoscopy.   INDICATIONS:  Rectal bleeding.   ANESTHESIA:  Demerol 60 mg, Versed 8 mg.   PROCEDURE:  With the patient mildly sedated in the left lateral decubitus  position, the Olympus videoscopic colonoscope was inserted in the rectum and  passed under direct vision.  With pressure applied, we reached the cecum,  identified by ileocecal valve and appendiceal orifice; both which were  photographed.  From this point, the colonoscope was slowly withdrawn, taking  circumferential views of colonic mucosa; stopping in the rectum which  appeared normal and direct showed hemorrhoids on retroflexed view.  The  endoscope was straightened and withdrawn. The patient's vital signs, pulse  oximetry remained stable. The patient tolerated the procedure well without  apparent complication.   FINDINGS:  Internal hemorrhoids, otherwise an unremarkable colonoscopic  examination to the cecum. Of note, the perineum was normal.   PLAN:  Have the patient follow up with me on an as-needed basis.           ______________________________  Georgiana Spinner, M.D.     GMO/MEDQ  D:  12/03/2004  T:  12/03/2004  Job:  [id number]

## 2010-05-31 NOTE — Discharge Summary (Signed)
NAMEMarland Kitchen  Tina [name], Tina [name]            ACCOUNT NO.:  [id number]   MEDICAL RECORD NO.:  [id number]          PATIENT TYPE:  INP   LOCATION:  5727                         FACILITY:  [facility]   PHYSICIAN:  Gertha Calkin, M.D.DATE OF BIRTH:  01-Jun-1970   DATE OF ADMISSION:  04/05/2004  DATE OF DISCHARGE:  04/08/2004                                 DISCHARGE SUMMARY   PRIMARY CARE PHYSICIAN:  Dr. Juleen China.   DISCHARGE DIAGNOSES:  1.  Complicated urinary tract infection.  2.  Intractable nausea and vomiting.  3.  Hypertension.  4.  Obesity.  5.  Diabetes.  6.  Dyslipidemia.   DISCHARGE MEDICATIONS:  1.  Resume home medications which include Actos, Lotrel, metoprolol, and her      birth control pill.  2.  New medication:  Keflex 500 mg p.o. b.i.d. x11 days.   DIAGNOSTIC TESTS:  1.  CT of chest negative for clot.  2.  2-D echocardiogram shows an EF of 55-65%.  3.  EKG:  Normal sinus rhythm, normal EKG, normal intervals, normal axes.   HOSPITAL COURSE:  Please see H&P for details of admission.   Problem 1. Complicated urinary tract infection.  The patient came in and  started on antibiotics.  Nausea and symptoms were controlled with IV  antiemetics.  The patient remained afebrile throughout the hospitalization,  was tolerating p.o. and switched to p.o. medications and antibiotics on day  2.  Culture results from urine returned on the day of discharge were  resistant to ciprofloxacin, therefore, I discharged her on Keflex to be  taken for a total course of 12 days of antibiotics.  Currently, her  abdominal pain and nausea symptoms have resolved and she is tolerating a  p.o. diet and remains afebrile.  No white count throughout hospitalization.   Problem 2. Chest pain/shortness of breath.  The patient with risk factor of  obesity and being on birth control pills recently started and questionable  long travel history to [place], however, CT of the chest returned negative  for any clots.  It  was an inadequate study for peripheral clots but would  not correlate with her symptoms.  EKG showed no acute changes consistent  with ischemia and enzymes were negative x3.  At this time, most likely this  is either habitus related plus or minus GI and/or asthma component.  This  can be worked in an outpatient setting.  On the day of discharge the patient  is no longer having chest pain or shortness of breath symptoms that she was  complaining about the previous day.  Her O2 saturations have remained 97%  plus.   Her other medical issues remained stable and no changes in her medications  were made during this hospitalization and to be resumed on discharge.   DISCHARGE LABS:  Total cholesterol 115, triglycerides 121, HDL 30, LDL 61.  BMET was normal.  Hemoglobin 9.9 and there was some microcytosis, however,  hemoglobin was stable as were her hemodynamics, therefore, this can be  worked up in an outpatient setting as well.   Followup needs to be with Dr. Juleen China in  about 7 to 10 days and further  treatment to be done in an outpatient setting.      JD/MEDQ  D:  04/08/2004  T:  04/08/2004  Job:  [id number]   cc:   Brooke Bonito, M.D.  [address]  [place]  [place] [address]  Fax: [phone]

## 2010-07-09 ENCOUNTER — Encounter: Payer: Federal, State, Local not specified - PPO | Attending: Physical Medicine & Rehabilitation

## 2010-07-09 ENCOUNTER — Ambulatory Visit: Payer: Federal, State, Local not specified - PPO | Admitting: Physical Medicine & Rehabilitation

## 2010-07-09 DIAGNOSIS — E119 Type 2 diabetes mellitus without complications: Secondary | ICD-10-CM | POA: Insufficient documentation

## 2010-07-09 DIAGNOSIS — R0602 Shortness of breath: Secondary | ICD-10-CM | POA: Insufficient documentation

## 2010-07-09 DIAGNOSIS — I6330 Cerebral infarction due to thrombosis of unspecified cerebral artery: Secondary | ICD-10-CM

## 2010-07-09 DIAGNOSIS — I10 Essential (primary) hypertension: Secondary | ICD-10-CM | POA: Insufficient documentation

## 2010-07-09 DIAGNOSIS — M79609 Pain in unspecified limb: Secondary | ICD-10-CM | POA: Insufficient documentation

## 2010-07-09 DIAGNOSIS — R29898 Other symptoms and signs involving the musculoskeletal system: Secondary | ICD-10-CM | POA: Insufficient documentation

## 2010-07-09 DIAGNOSIS — F341 Dysthymic disorder: Secondary | ICD-10-CM | POA: Insufficient documentation

## 2010-07-09 DIAGNOSIS — I69959 Hemiplegia and hemiparesis following unspecified cerebrovascular disease affecting unspecified side: Secondary | ICD-10-CM | POA: Insufficient documentation

## 2010-07-09 DIAGNOSIS — IMO0002 Reserved for concepts with insufficient information to code with codable children: Secondary | ICD-10-CM

## 2010-07-09 DIAGNOSIS — R112 Nausea with vomiting, unspecified: Secondary | ICD-10-CM | POA: Insufficient documentation

## 2010-07-09 DIAGNOSIS — R42 Dizziness and giddiness: Secondary | ICD-10-CM | POA: Insufficient documentation

## 2010-07-09 DIAGNOSIS — R109 Unspecified abdominal pain: Secondary | ICD-10-CM | POA: Insufficient documentation

## 2010-07-09 DIAGNOSIS — M62838 Other muscle spasm: Secondary | ICD-10-CM | POA: Insufficient documentation

## 2010-07-09 DIAGNOSIS — R209 Unspecified disturbances of skin sensation: Secondary | ICD-10-CM | POA: Insufficient documentation

## 2010-07-09 DIAGNOSIS — G8110 Spastic hemiplegia affecting unspecified side: Secondary | ICD-10-CM

## 2010-07-10 NOTE — Assessment & Plan Note (Signed)
A 40 year old female with history of left pontine stroke.  She has had previous strokes as well several years ago.  She has chief complaint of balance problems, right-sided weakness.  She has newer complaint of increasing pain going down the right leg as well as some numbness and tingling type discomfort.  She has onset about 2-3 weeks ago, no trauma. She has had about 3 falls since I saw her back in February, but none of which really resulted in any significant trauma.  She has had right calf muscle spasms, had a brain MRI last week at Dr. Marlis Edelson office, but does not have the results back yet.  Her average pain is in the 4/10 range, described as dull, stabbing, tingling, and aching.  The tingling is mainly in the right lower extremity.  Her sleep is poor.  She has her pain worse with walking, sitting, standing.  She ambulates without assistance.  She climbs steps.  She is not driving currently.  She was having some problems with depth perception and Dr. Pearlean Brownie, told her to quit driving.  She also reportedly underwent the MRI scan to further evaluate.  REVIEW OF SYSTEMS:  Positive for numbness and tingling in the right lower extremity, trouble walking, spasms, dizziness, confusion, depression, anxiety, abdominal pain, nausea, vomiting, some swelling, and shortness of breath.  Please see health and history form for further details.  PRIMARY CARE PHYSICIAN:  Dr. [name] Needle.  PAST MEDICAL HISTORY:  Significant for diabetes and hypertension.  SOCIAL HISTORY:  Married, lives with her husband and her children. Denies any drug or alcohol use or smoking.  PHYSICAL EXAMINATION:  VITAL SIGNS:  Blood pressure 150/86, pulse 91, respirations 16, O2 sat 100% on room air. GENERAL:  Overweight African American female in no acute distress. MUSCULOSKELETAL:  Her gait shows no evidence of toe drag or knee instability.  She has a widened base support.  She has 4/5 strength in the right deltoid  biceps, triceps grip, 5/5 in left side, and right lower extremity 4/5 in hip flexion, knee extension, ankle dorsiflexor, and 5/5 in left side.  Sensation reduced on the right side to pinprick in the right upper extremity as well as right lower extremity with exception of L4 and L5 dermatomes which are actually more sensitive on the left side than on the right side.  Her deep tendon reflexes are hyperactive on the right side 3, and normal on the left at 2.  IMPRESSION:  Left pontine infarct with right hemiparesis.  She has some sensory deficits and she may have stroke-related dysesthesias for which we will still restart her on gabapentin.  She is now no longer breast feeding.  We will also restart on tramadol and change it to 50 mg b.i.d.  In addition, we will have physical therapy work with their home health environment.  She no longer drives because of some visual problems.  I recommended followup with Ophthalmology to further assess.  I will see her back in 1 month, if not much better, we may consider lumbosacral MRI given that she has decreased sensation at L4 and L5 dermatomal distribution as well as back pain that radiates down to the right lower extremity, once again complicated situation given stroke with similar sensory and motor deficits.  Discussed with patient, agrees with plan.  I will see her in 1 month.     Erick Colace, M.D. Electronically Signed    AEK/MedQ D:  07/09/2010 15:23:09  T:  07/10/2010 01:14:54  Job #:  [id number] 

## 2010-08-12 ENCOUNTER — Encounter: Payer: Federal, State, Local not specified - PPO | Attending: Physical Medicine & Rehabilitation

## 2010-08-12 ENCOUNTER — Ambulatory Visit: Payer: Federal, State, Local not specified - PPO | Admitting: Physical Medicine & Rehabilitation

## 2010-08-12 DIAGNOSIS — R42 Dizziness and giddiness: Secondary | ICD-10-CM | POA: Insufficient documentation

## 2010-08-12 DIAGNOSIS — F341 Dysthymic disorder: Secondary | ICD-10-CM | POA: Insufficient documentation

## 2010-08-12 DIAGNOSIS — R0602 Shortness of breath: Secondary | ICD-10-CM | POA: Insufficient documentation

## 2010-08-12 DIAGNOSIS — R112 Nausea with vomiting, unspecified: Secondary | ICD-10-CM | POA: Insufficient documentation

## 2010-08-12 DIAGNOSIS — I69959 Hemiplegia and hemiparesis following unspecified cerebrovascular disease affecting unspecified side: Secondary | ICD-10-CM | POA: Insufficient documentation

## 2010-08-12 DIAGNOSIS — R209 Unspecified disturbances of skin sensation: Secondary | ICD-10-CM | POA: Insufficient documentation

## 2010-08-12 DIAGNOSIS — E119 Type 2 diabetes mellitus without complications: Secondary | ICD-10-CM | POA: Insufficient documentation

## 2010-08-12 DIAGNOSIS — M76899 Other specified enthesopathies of unspecified lower limb, excluding foot: Secondary | ICD-10-CM

## 2010-08-12 DIAGNOSIS — I10 Essential (primary) hypertension: Secondary | ICD-10-CM | POA: Insufficient documentation

## 2010-08-12 DIAGNOSIS — I6330 Cerebral infarction due to thrombosis of unspecified cerebral artery: Secondary | ICD-10-CM

## 2010-08-12 DIAGNOSIS — R109 Unspecified abdominal pain: Secondary | ICD-10-CM | POA: Insufficient documentation

## 2010-08-12 DIAGNOSIS — R29898 Other symptoms and signs involving the musculoskeletal system: Secondary | ICD-10-CM | POA: Insufficient documentation

## 2010-08-12 DIAGNOSIS — M79609 Pain in unspecified limb: Secondary | ICD-10-CM | POA: Insufficient documentation

## 2010-08-12 DIAGNOSIS — M62838 Other muscle spasm: Secondary | ICD-10-CM | POA: Insufficient documentation

## 2010-08-12 NOTE — Assessment & Plan Note (Signed)
REASON FOR VISIT:  Right-sided body pain.  HISTORY:  A 40 year old female with prior history of CVA causing right hemiparesis, left pontine.  She has been restarted on gabapentin last visit for poststroke paresthesias, this was on July 09, 2010.  She has had no new medical problems in the interval time period, but has had increasing pain in the right hip to the point where she really cannot even lay on that side, pain is rated 6-7/10, worse at nighttime when she is trying to sleep, walking, sitting, standing also seem to aggravate. She does have some radiation in the pain to her knee and below toward the calf.  She has some difficulty with bathing, household duties, shopping, numbness, tremor, tingling, trouble walking, spasms, dizziness, confusion, depression, anxiety are all positive on review of systems.  PAST MEDICAL HISTORY:  Diabetes, hypertension.  SOCIAL HISTORY:  Married, has 2 infants at home.  PHYSICAL EXAMINATION:  VITAL SIGNS:  Blood pressure 140/67, pulse 88, respirations 16, and O2 sat 99% on room air. GENERAL:  No acute distress.  Mood and affect appropriate.  And affect alert. MUSCULOSKELETAL:  She has no evidence of sensory deficits on the left side.  She does have some decreased sensation in the right S1 dermatomal distribution, but she has hyperactive reflexes in the right side in the biceps, triceps, brachioradialis, knee, and ankle.  Left side has decreased deep tendon reflex at the ankle.  Her hip has no pain with range of motion, but has some pain to direct palpation over the greater trochanter.  IMPRESSION: 1. Increasing right hip pain.  I think this is a trochanteric     bursitis.  We will inject today since she is a poor candidate for     NSAIDs given her diabetes, hypertension, recent strokes. 2. Poststroke paresthesia, continue gabapentin and tramadol. 3. I will see her back in 1 month.     Erick Colace, M.D. Electronically  Signed    AEK/MedQ D:  08/12/2010 16:41:51  T:  08/12/2010 19:19:47  Job #:  [id number]

## 2010-09-10 ENCOUNTER — Encounter: Payer: Federal, State, Local not specified - PPO | Attending: Physical Medicine & Rehabilitation

## 2010-09-10 ENCOUNTER — Ambulatory Visit: Payer: Federal, State, Local not specified - PPO | Admitting: Physical Medicine & Rehabilitation

## 2010-09-10 DIAGNOSIS — M62838 Other muscle spasm: Secondary | ICD-10-CM | POA: Insufficient documentation

## 2010-09-10 DIAGNOSIS — I69959 Hemiplegia and hemiparesis following unspecified cerebrovascular disease affecting unspecified side: Secondary | ICD-10-CM | POA: Insufficient documentation

## 2010-09-10 DIAGNOSIS — G8110 Spastic hemiplegia affecting unspecified side: Secondary | ICD-10-CM

## 2010-09-10 NOTE — Assessment & Plan Note (Signed)
REASON FOR VISIT:  Right-sided weakness.  HISTORY:  A 40 year old female with history of right hemapheresis due to CVA.  She is having increased difficulty with falls.  She has been told not to drive because she is having difficulty judging distances.  She does have some spasms that wake her up at night.  She is not taking any spasm medicines.  She does take some tramadol 50 nightly.  She has had no new medical problems other than those described above.  She remains on gabapentin 300 t.i.d.  Social; continues to help care for her 2-year-old and 75-month-old.  She does have a nephew that stays with her that helps to take the kids up and down the steps to the second floor.  Blood pressure 139/77, pulse 86, respirations 18 and O2 sat 99% on room air.  INTERVAL MEDICAL HISTORY:  Dr. Pearlean Brownie has re-evaluated the patient's carotid Dopplers and these are reportedly no change compared to prior. Please see health and history form for additional review of systems otherwise negative.  She does have 4/5 strength in right deltoid, biceps, triceps, grip as well as right hip flexion, knee extensor, but only 3- at ankle dorsiflexor.  She ambulates with some foot drag on the right side.  No knee instability.  She does have some clonus at the right ankle.  IMPRESSION:  Right spastic hemiplegia due to left cerebrovascular accident.  PLAN: 1. We will start some tizanidine 4 mg nightly.  We will continue     tramadol 50 nightly. 2. I will see her back in 1 month, may consider daytime tizanidine.     May need to consider botulinum toxin injection as well.  Discussed     with the patient, agrees with plan.     Erick Colace, M.D. Electronically Signed    AEK/MedQ D:  09/10/2010 16:24:38  T:  09/10/2010 20:55:13  Job #:  [id number]

## 2010-09-16 ENCOUNTER — Emergency Department (HOSPITAL_COMMUNITY): Payer: Federal, State, Local not specified - PPO

## 2010-09-16 ENCOUNTER — Emergency Department (HOSPITAL_COMMUNITY)
Admission: EM | Admit: 2010-09-16 | Discharge: 2010-09-16 | Disposition: A | Discharge status: Home / Self Care | Payer: Federal, State, Local not specified - PPO | Attending: Emergency Medicine | Admitting: Emergency Medicine

## 2010-09-16 DIAGNOSIS — T148XXA Other injury of unspecified body region, initial encounter: Secondary | ICD-10-CM | POA: Insufficient documentation

## 2010-09-16 DIAGNOSIS — E119 Type 2 diabetes mellitus without complications: Secondary | ICD-10-CM | POA: Insufficient documentation

## 2010-09-16 DIAGNOSIS — E7800 Pure hypercholesterolemia, unspecified: Secondary | ICD-10-CM | POA: Insufficient documentation

## 2010-09-16 DIAGNOSIS — M25569 Pain in unspecified knee: Secondary | ICD-10-CM | POA: Insufficient documentation

## 2010-09-16 DIAGNOSIS — R296 Repeated falls: Secondary | ICD-10-CM | POA: Insufficient documentation

## 2010-09-16 DIAGNOSIS — I10 Essential (primary) hypertension: Secondary | ICD-10-CM | POA: Insufficient documentation

## 2010-09-16 DIAGNOSIS — Z8673 Personal history of transient ischemic attack (TIA), and cerebral infarction without residual deficits: Secondary | ICD-10-CM | POA: Insufficient documentation

## 2010-09-16 DIAGNOSIS — Z794 Long term (current) use of insulin: Secondary | ICD-10-CM | POA: Insufficient documentation

## 2010-09-16 DIAGNOSIS — Z8742 Personal history of other diseases of the female genital tract: Secondary | ICD-10-CM | POA: Insufficient documentation

## 2010-09-16 DIAGNOSIS — M25579 Pain in unspecified ankle and joints of unspecified foot: Secondary | ICD-10-CM | POA: Insufficient documentation

## 2010-09-16 DIAGNOSIS — M545 Low back pain, unspecified: Secondary | ICD-10-CM | POA: Insufficient documentation

## 2010-09-16 LAB — POCT PREGNANCY, URINE: Preg Test, Ur: NEGATIVE

## 2010-09-19 ENCOUNTER — Encounter: Payer: Federal, State, Local not specified - PPO | Attending: Physical Medicine & Rehabilitation

## 2010-09-19 ENCOUNTER — Ambulatory Visit: Payer: Federal, State, Local not specified - PPO | Admitting: Physical Medicine & Rehabilitation

## 2010-09-19 DIAGNOSIS — M545 Low back pain, unspecified: Secondary | ICD-10-CM

## 2010-09-19 DIAGNOSIS — F341 Dysthymic disorder: Secondary | ICD-10-CM | POA: Insufficient documentation

## 2010-09-19 DIAGNOSIS — R42 Dizziness and giddiness: Secondary | ICD-10-CM | POA: Insufficient documentation

## 2010-09-19 DIAGNOSIS — R29898 Other symptoms and signs involving the musculoskeletal system: Secondary | ICD-10-CM | POA: Insufficient documentation

## 2010-09-19 DIAGNOSIS — R0602 Shortness of breath: Secondary | ICD-10-CM | POA: Insufficient documentation

## 2010-09-19 DIAGNOSIS — R112 Nausea with vomiting, unspecified: Secondary | ICD-10-CM | POA: Insufficient documentation

## 2010-09-19 DIAGNOSIS — I69959 Hemiplegia and hemiparesis following unspecified cerebrovascular disease affecting unspecified side: Secondary | ICD-10-CM | POA: Insufficient documentation

## 2010-09-19 DIAGNOSIS — R209 Unspecified disturbances of skin sensation: Secondary | ICD-10-CM | POA: Insufficient documentation

## 2010-09-19 DIAGNOSIS — R109 Unspecified abdominal pain: Secondary | ICD-10-CM | POA: Insufficient documentation

## 2010-09-19 DIAGNOSIS — E119 Type 2 diabetes mellitus without complications: Secondary | ICD-10-CM | POA: Insufficient documentation

## 2010-09-19 DIAGNOSIS — G8110 Spastic hemiplegia affecting unspecified side: Secondary | ICD-10-CM

## 2010-09-19 DIAGNOSIS — M62838 Other muscle spasm: Secondary | ICD-10-CM | POA: Insufficient documentation

## 2010-09-19 DIAGNOSIS — I10 Essential (primary) hypertension: Secondary | ICD-10-CM | POA: Insufficient documentation

## 2010-09-19 DIAGNOSIS — M79609 Pain in unspecified limb: Secondary | ICD-10-CM | POA: Insufficient documentation

## 2010-09-23 NOTE — Assessment & Plan Note (Signed)
REASON FOR VISIT:  Back and buttock pain.  HISTORY AND PHYSICAL:  A 40 year old female that I just saw 1 week ago for her right spastic hemiplegia which is chronic due to her left CVA. She has had some falls.  She has had spasms on the right side treated with tizanidine at night.  Her pontine infarct was in June 2011.  She does not use any assistive device.  She has gained some weight.  She has had reevaluation with Dr. Delia Heady, recently and had some carotid Dopplers showing no change compared to prior.  She had a fall on September 3, went into the ED.  She thinks the floor was wet.  She states her pain is about 8/10.  Sleep is poor.  Pain is worse with walking, bending, sitting, standing.  She can climb steps. She does not drive.  She needs help with bathing, household duties, shopping.  REVIEW OF SYSTEMS:  Positive for weakness, numbness, spasms, dizziness, confusion, depression anxiety, nausea, and limb swelling.  PHYSICAL EXAMINATION:  VITAL SIGNS:  Blood pressure 155/74, pulse 99, respirations 18, and O2 sat 96% on room air. GENERAL:  This is an obese female in no acute distress. MUSCULOSKELETAL:  Her right upper extremity strength is 4/5 in the right deltoid, biceps, triceps, grip 5/5 on the left side.  Right lower extremity, she has 4/5 strength in the hip flexor, knee extensor, and 4- in the ankle dorsiflexor, plantar flexor, and 3- in the toe flexors and extensors.  This is stable compared to prior.  I reviewed her x-rays.  Lumbosacral spine complete four views were negative, right ankle which she had been complaining of after the fall was negative.  Left knee was negative.  I looked at the actual films and concur with Radiology interpretation.  She has full range of motion in the knees and ankles.  She has tenderness to palpation bilateral trochanteric bursa area, tenderness in the lumbar paraspinal muscles at the lumbosacral junction.  Negative straight leg raise  testing.  Sensation is mildly reduced in the right lateral toes third, fourth, fifth.  Her neurologic status is stable compared to prior.  Her mentation is intact.  Speech is unchanged.  IMPRESSION: 1. Chronic right spastic hemiplegia due to left pontine CVA.  I think     she needs to get some additional physical therapy.  She does not     drive, so will have home health come in. 2. Increase her tramadol to 50 t.i.d. for 2 weeks. 3. I will see back in 2 weeks, followup on therapy progress.  I have     written a prescription for a cane.  I think she needs to use it at     all time.  I think that over time she may be coming a bit more     deconditioned and needs a therapy tune-up as well as continue home     exercise program.     Erick Colace, M.D. Electronically Signed   AEK/MedQ D:  09/19/2010 13:12:12  T:  09/19/2010 13:38:20  Job #:  [id number]

## 2010-10-02 ENCOUNTER — Emergency Department (HOSPITAL_COMMUNITY)
Admission: EM | Admit: 2010-10-02 | Discharge: 2010-10-02 | Disposition: A | Discharge status: Home / Self Care | Payer: Federal, State, Local not specified - PPO | Attending: Emergency Medicine | Admitting: Emergency Medicine

## 2010-10-02 DIAGNOSIS — E119 Type 2 diabetes mellitus without complications: Secondary | ICD-10-CM | POA: Insufficient documentation

## 2010-10-02 DIAGNOSIS — F411 Generalized anxiety disorder: Secondary | ICD-10-CM | POA: Insufficient documentation

## 2010-10-02 DIAGNOSIS — R064 Hyperventilation: Secondary | ICD-10-CM | POA: Insufficient documentation

## 2010-10-02 DIAGNOSIS — Z79899 Other long term (current) drug therapy: Secondary | ICD-10-CM | POA: Insufficient documentation

## 2010-10-02 LAB — GLUCOSE, CAPILLARY

## 2010-10-04 ENCOUNTER — Ambulatory Visit: Payer: Federal, State, Local not specified - PPO | Admitting: Physical Medicine & Rehabilitation

## 2010-10-04 DIAGNOSIS — F410 Panic disorder [episodic paroxysmal anxiety] without agoraphobia: Secondary | ICD-10-CM

## 2010-10-04 DIAGNOSIS — G8110 Spastic hemiplegia affecting unspecified side: Secondary | ICD-10-CM

## 2010-10-04 NOTE — Assessment & Plan Note (Signed)
HISTORY:  The patient returns today, 6/10 pain, but overall improving in the back.  She had a fall about a month ago, this has improved.  She saw her eye doctor for retinopathy, hospitalized for panic attack related to eye procedure in the office.  She continues have some burning pain on right side of the body.  She gets relief from gabapentin.  She is getting some therapy to help strengthen lower extremities to avoid falls.  She has some hip pain bilaterally left greater than right.  Blood pressure 160/81, pulse 90, respirations 16, and O2 sat 98% on room air.  She plans to follow up Dr. Juleen China, in regards to her back.  Examination, she has 4/5 strength in right side and 5/5 left side.  She has tenderness in the left greater trochanter and left gluteus medius.  IMPRESSION: 1. Right hemiparesis due to cerebrovascular accident. 2. Trochanteric bursitis. 3. Dysesthetic pain.  PLAN: 1. Continue tramadol at night. 2. Gabapentin 300 t.i.d. 3. Continue her tizanidine for spasticity 4 mg nightly.  Discussed with the patient, agrees with plan.  She needs to see Dr. Milagros Evener, from Psychiatry, for panic attacks and make a referral. I will see her back in 6 weeks.     Erick Colace, M.D. Electronically Signed    AEK/MedQ D:  10/04/2010 15:35:23  T:  10/04/2010 09:81:19  Job #:  [id number]

## 2010-10-11 ENCOUNTER — Ambulatory Visit: Payer: Federal, State, Local not specified - PPO | Admitting: Physical Medicine & Rehabilitation

## 2010-10-11 LAB — CBC
HCT: 36.8
MCV: 71 — ABNORMAL LOW
Platelets: 488 — ABNORMAL HIGH
RBC: 5.18 — ABNORMAL HIGH
WBC: 10.2

## 2010-10-11 LAB — WET PREP, GENITAL
Clue Cells Wet Prep HPF POC: NONE SEEN
Yeast Wet Prep HPF POC: NONE SEEN

## 2010-10-11 LAB — URINALYSIS, ROUTINE W REFLEX MICROSCOPIC
Ketones, ur: 15 — AB
Leukocytes, UA: NEGATIVE
Nitrite: NEGATIVE
Specific Gravity, Urine: >1.03 — ABNORMAL HIGH
pH: 5.5

## 2010-10-11 LAB — URINE MICROSCOPIC-ADD ON

## 2010-10-11 LAB — GC/CHLAMYDIA PROBE AMP, GENITAL: Chlamydia, DNA Probe: NEGATIVE

## 2010-10-16 LAB — CBC
HCT: 34.9 — ABNORMAL LOW
Hemoglobin: 11.2 — ABNORMAL LOW
MCHC: 32.1
RDW: 17.5 — ABNORMAL HIGH

## 2010-10-16 LAB — URINALYSIS, ROUTINE W REFLEX MICROSCOPIC
Bilirubin Urine: NEGATIVE
Ketones, ur: NEGATIVE
Nitrite: NEGATIVE
Urobilinogen, UA: 0.2

## 2010-11-11 ENCOUNTER — Ambulatory Visit: Payer: Federal, State, Local not specified - PPO | Admitting: Physical Medicine & Rehabilitation

## 2010-11-11 ENCOUNTER — Encounter: Payer: Federal, State, Local not specified - PPO | Attending: Physical Medicine & Rehabilitation

## 2010-11-11 DIAGNOSIS — M62838 Other muscle spasm: Secondary | ICD-10-CM | POA: Insufficient documentation

## 2010-11-11 DIAGNOSIS — R109 Unspecified abdominal pain: Secondary | ICD-10-CM | POA: Insufficient documentation

## 2010-11-11 DIAGNOSIS — R209 Unspecified disturbances of skin sensation: Secondary | ICD-10-CM | POA: Insufficient documentation

## 2010-11-11 DIAGNOSIS — M79609 Pain in unspecified limb: Secondary | ICD-10-CM | POA: Insufficient documentation

## 2010-11-11 DIAGNOSIS — G8110 Spastic hemiplegia affecting unspecified side: Secondary | ICD-10-CM

## 2010-11-11 DIAGNOSIS — R29898 Other symptoms and signs involving the musculoskeletal system: Secondary | ICD-10-CM | POA: Insufficient documentation

## 2010-11-11 DIAGNOSIS — I10 Essential (primary) hypertension: Secondary | ICD-10-CM | POA: Insufficient documentation

## 2010-11-11 DIAGNOSIS — R112 Nausea with vomiting, unspecified: Secondary | ICD-10-CM | POA: Insufficient documentation

## 2010-11-11 DIAGNOSIS — I6330 Cerebral infarction due to thrombosis of unspecified cerebral artery: Secondary | ICD-10-CM

## 2010-11-11 DIAGNOSIS — R0602 Shortness of breath: Secondary | ICD-10-CM | POA: Insufficient documentation

## 2010-11-11 DIAGNOSIS — E119 Type 2 diabetes mellitus without complications: Secondary | ICD-10-CM | POA: Insufficient documentation

## 2010-11-11 DIAGNOSIS — I69959 Hemiplegia and hemiparesis following unspecified cerebrovascular disease affecting unspecified side: Secondary | ICD-10-CM | POA: Insufficient documentation

## 2010-11-11 DIAGNOSIS — R42 Dizziness and giddiness: Secondary | ICD-10-CM | POA: Insufficient documentation

## 2010-11-11 DIAGNOSIS — F341 Dysthymic disorder: Secondary | ICD-10-CM | POA: Insufficient documentation

## 2010-11-11 NOTE — Assessment & Plan Note (Signed)
REASON FOR VISIT:  Problems with walking and anxiety.  HISTORY:  This is a 40 year old female who has a chronic right hemiparesis due to CVA.  She had her stroke just a few months after giving birth to her twin sons.  After her rehab hospitalization she became pregnant once again and has given birth 8 months ago to another son.  Her last inpatient rehab stay was on June 21, 2009.  She continues to follow with primary care for hypertension, dyslipidemia, and diabetes mellitus type 2.  She has had some chronic headaches, although this has been improved with tramadol.  She does have some chronic right-sided stroke related pain, although this has not been very significant.  She has had some spasticity in the right quadriceps, although this has improved with time.  She has had some trochanteric bursitis as well. She has gone through some home health therapy.  PHYSICAL EXAMINATION:  MUSCULOSKELETAL:  She has 4/5 strength in the right deltoid biceps, triceps, grip, as well as hip flexion, knee extension, ankle dorsiflexor 5/5 on the left side.  Pain score 6/10 mainly on the right hemi body.  Pain is worse with walking, bending, sitting, standing; improves with therapy and medications.  Walking tolerance is 13 minutes.  She is able climb steps.  SOCIAL HISTORY:  Complicated, now over mother-in-law lives with her since June.  She has her sister who is 42 living with her that is graduating college this year.  She has a niece who 4, who is in high school.  In addition, she has a nephew who is 56 year old, who is going to [employer].  REVIEW OF SYSTEMS:  Weakness, spasms, dizziness, confusion, depression, anxiety, nausea, abdominal pain, limb swelling.  PHYSICAL EXAMINATION:  VITAL SIGNS:  Blood pressure 180/87, pulse 95, respirations 16, O2 saturation 99% on room air. GENERAL:  This is an anxious-appearing female, in no acute distress. Mood and affect otherwise appropriate and.  Her gait shows no  evidence toe drag or knee instability.  Slightly widened base support.  IMPRESSION: 1. Chronic right spastic hemiparesis due to a prior cerebrovascular     accident. 2. Poststroke depression as well as some anxiety component I think     Zoloft may be a good choice for her.  We did make a referral to Dr.     Milagros Evener for panic attacks, but the patient never got a call     back, we will send another referral.  In the meantime, start Zoloft     25 mg q.a.m. x7 days and then increase it to 50 mg.  Discussed with     the patient and agrees with plan.     Erick Colace, M.D. Electronically Signed    AEK/MedQ D:  11/11/2010 16:17:13  T:  11/11/2010 20:46:38  Job #:  [id number]

## 2010-12-10 ENCOUNTER — Ambulatory Visit: Payer: Federal, State, Local not specified - PPO | Admitting: Physical Medicine & Rehabilitation

## 2010-12-10 ENCOUNTER — Encounter: Payer: Federal, State, Local not specified - PPO | Attending: Physical Medicine & Rehabilitation

## 2010-12-10 DIAGNOSIS — R29898 Other symptoms and signs involving the musculoskeletal system: Secondary | ICD-10-CM | POA: Insufficient documentation

## 2010-12-10 DIAGNOSIS — I10 Essential (primary) hypertension: Secondary | ICD-10-CM | POA: Insufficient documentation

## 2010-12-10 DIAGNOSIS — F064 Anxiety disorder due to known physiological condition: Secondary | ICD-10-CM

## 2010-12-10 DIAGNOSIS — R209 Unspecified disturbances of skin sensation: Secondary | ICD-10-CM | POA: Insufficient documentation

## 2010-12-10 DIAGNOSIS — R109 Unspecified abdominal pain: Secondary | ICD-10-CM | POA: Insufficient documentation

## 2010-12-10 DIAGNOSIS — F341 Dysthymic disorder: Secondary | ICD-10-CM | POA: Insufficient documentation

## 2010-12-10 DIAGNOSIS — M79609 Pain in unspecified limb: Secondary | ICD-10-CM | POA: Insufficient documentation

## 2010-12-10 DIAGNOSIS — R0602 Shortness of breath: Secondary | ICD-10-CM | POA: Insufficient documentation

## 2010-12-10 DIAGNOSIS — M62838 Other muscle spasm: Secondary | ICD-10-CM | POA: Insufficient documentation

## 2010-12-10 DIAGNOSIS — E119 Type 2 diabetes mellitus without complications: Secondary | ICD-10-CM | POA: Insufficient documentation

## 2010-12-10 DIAGNOSIS — R42 Dizziness and giddiness: Secondary | ICD-10-CM | POA: Insufficient documentation

## 2010-12-10 DIAGNOSIS — I69959 Hemiplegia and hemiparesis following unspecified cerebrovascular disease affecting unspecified side: Secondary | ICD-10-CM | POA: Insufficient documentation

## 2010-12-10 DIAGNOSIS — R112 Nausea with vomiting, unspecified: Secondary | ICD-10-CM | POA: Insufficient documentation

## 2010-12-10 NOTE — Assessment & Plan Note (Signed)
REASON FOR VISIT:  Right leg weakness.  HISTORY:  A 40 year old female status post right CVA, causing chronic right hemiparesis.  Her original stroke was in June 2011.  She completed inpatient rehabilitation and went through outpatient rehabilitation. She resumed independent status, but continues to have right lower extremity weakness.  She has had some headaches and plans to follow up with neurologist for this.  She has had anxiety, depression, and plans to see a psychiatrist.  I have made a referral, but somehow the patient did not get an appointment yet.  She continues to have spasms at night and during the day, was on tizanidine just a 4 mg at night, but she has been splitting in 2, taking 1 tablet during the day.  She does take tramadol, she wished to take it t.i.d. but really she has been taking just at night.  She was started on Zoloft and since starting this, she has felt a little "spacey."  She has had no other new medical problems in the interval time period.  Her average pain is 7/10, current is 5.  She can walk 13 minutes at a time.  She can climbs steps.  She does not drive currently.  She needs some help with bathing, dressing, household duties, and shopping.  She has anxiety, depression.  She has had suicidal thoughts in the past, but none currently, no plan.  Numbness, tingling, spasms, dizziness are in the review of systems.  PHYSICAL EXAMINATION:  VITAL SIGNS:  Blood pressure 144/76, pulse 82, respirations 18, O2 sat 100% on room air.  Weight 241 pounds, height 5 feet 6-1/2 inches. NEUROLOGIC:  No acute stress.  Orientation x3.  Affect alert.  Gait is unstable, but no evidence toe drag or knee instability.  Her right quad strength is 4-/5.  Remainder of muscle groups are 5/5 in the upper and lower extremities with the exception of some mild distal weakness in the right upper extremity.  IMPRESSION:  Chronic right spastic hemiparesis due to left cerebrovascular  accident.  PLAN: 1. She will need to continue some quad strengthening, although I think     at this point, she is plateaued and will always need to be careful     going down steps given that this requires quite a bit of quadriceps     strength given her weight. 2. In terms of her headache, she will follow up with Neurology. 3. In terms her spasms, we will increase her Zanaflex 4 mg b.i.d. 4. We will increase her tramadol to 50 mg t.i.d.  We will stop her     Zoloft and refer to another psychiatrist.     Erick Colace, M.D. Electronically Signed    AEK/MedQ D:  12/10/2010 14:43:12  T:  12/10/2010 22:55:13  Job #:  [id number]  cc:   Pramod P. Pearlean Brownie, MD Fax: [phone]

## 2011-02-04 ENCOUNTER — Ambulatory Visit: Payer: Federal, State, Local not specified - PPO | Admitting: Physical Medicine & Rehabilitation

## 2011-02-04 ENCOUNTER — Encounter: Payer: Federal, State, Local not specified - PPO | Attending: Physical Medicine & Rehabilitation

## 2011-02-04 DIAGNOSIS — R109 Unspecified abdominal pain: Secondary | ICD-10-CM | POA: Insufficient documentation

## 2011-02-04 DIAGNOSIS — R29898 Other symptoms and signs involving the musculoskeletal system: Secondary | ICD-10-CM | POA: Insufficient documentation

## 2011-02-04 DIAGNOSIS — R42 Dizziness and giddiness: Secondary | ICD-10-CM | POA: Insufficient documentation

## 2011-02-04 DIAGNOSIS — F341 Dysthymic disorder: Secondary | ICD-10-CM | POA: Insufficient documentation

## 2011-02-04 DIAGNOSIS — R0602 Shortness of breath: Secondary | ICD-10-CM | POA: Insufficient documentation

## 2011-02-04 DIAGNOSIS — R112 Nausea with vomiting, unspecified: Secondary | ICD-10-CM | POA: Insufficient documentation

## 2011-02-04 DIAGNOSIS — I69959 Hemiplegia and hemiparesis following unspecified cerebrovascular disease affecting unspecified side: Secondary | ICD-10-CM | POA: Insufficient documentation

## 2011-02-04 DIAGNOSIS — I10 Essential (primary) hypertension: Secondary | ICD-10-CM | POA: Insufficient documentation

## 2011-02-04 DIAGNOSIS — R209 Unspecified disturbances of skin sensation: Secondary | ICD-10-CM | POA: Insufficient documentation

## 2011-02-04 DIAGNOSIS — E119 Type 2 diabetes mellitus without complications: Secondary | ICD-10-CM | POA: Insufficient documentation

## 2011-02-04 DIAGNOSIS — G8110 Spastic hemiplegia affecting unspecified side: Secondary | ICD-10-CM

## 2011-02-04 DIAGNOSIS — M62838 Other muscle spasm: Secondary | ICD-10-CM | POA: Insufficient documentation

## 2011-02-04 DIAGNOSIS — M79609 Pain in unspecified limb: Secondary | ICD-10-CM | POA: Insufficient documentation

## 2011-02-04 NOTE — Assessment & Plan Note (Signed)
A 41 year old female, status post left CVA causing chronic right hemiparesis.  Original stroke was in June 2011.  She has completed inpatient rehabilitation, completed outpatient rehabilitation.  Her last visit with me was December 10, 2010.  She has a history of anxiety and depression.  We did refer to a psychiatrist.  There was some up front to see that she has not paid yet and has not gone to see the physician yet. She has been discontinued from her Zoloft.  This made her feel "spacey." She actually feels less anxious than she did last time and in fact has started walking on the treadmill.  She goes about 10 minutes twice a day.  She complains of some leg swelling as well as numbness, tingling after walking.  No increased back pain with walking.  She is on tizanidine 4 mg at night during the day.  It makes her little bit too tired.  SOCIAL HISTORY:  She cares for her 48-year-old as well as her 76-year-old twins.  Her husband works.  PHYSICAL EXAMINATION:  GENERAL:  Obese female, in no acute distress. VITAL SIGNS:  Weight 246 pounds, height 5 feet, 6 inches, blood pressure 164/88, pulse 95, respiratory rate is 16 and O2 sat 99% on room air. Mood and affect appropriate. EXTREMITIES:  A 4/5 strength in the right hip flexion, knee extension, ankle dorsiflexion and 5/5 on the left side.  Sensation is reduced on the right side to the light touch compared to the left side.  Deep tendon reflexes are hyperreflexic on the right side compared to the left side.  There is 4 beats of clonus on the right side.  IMPRESSION:  Chronic right spastic hemiparesis due to left cerebrovascular accident.  PLAN: 1. Continue the treadmill training.  I do think that after she gets     off the treadmill, she should ice her calf muscle.  In addition,     she continued the Zanaflex at night.  We discussed other     antispasticity treatments including Botox, however, this may throw     off her walking 2.  Anxiety and depression.  She is not on any medications at the     current time, but feeling better with the exercise.  She plans to     follow up with Psychiatry when she gets up once available to pay     the up-front feet.  Discussed with the patient, agrees with plan.     I will see her back in 3 months or sooner if need be.     Erick Colace, M.D. Electronically Signed    AEK/MedQ D:  02/04/2011 15:29:22  T:  02/04/2011 21:30:42  Job #:  [id number]  cc:   Pramod P. Pearlean Brownie, MD Fax: [phone]

## 2011-04-29 ENCOUNTER — Ambulatory Visit: Payer: Federal, State, Local not specified - PPO | Admitting: Physical Medicine & Rehabilitation

## 2011-05-09 ENCOUNTER — Encounter: Payer: Federal, State, Local not specified - PPO | Attending: Physical Medicine & Rehabilitation

## 2011-05-09 ENCOUNTER — Ambulatory Visit (HOSPITAL_BASED_OUTPATIENT_CLINIC_OR_DEPARTMENT_OTHER): Payer: Federal, State, Local not specified - PPO | Admitting: Physical Medicine & Rehabilitation

## 2011-05-09 ENCOUNTER — Encounter: Payer: Self-pay | Admitting: Physical Medicine & Rehabilitation

## 2011-05-09 VITALS — BP 149/74 | HR 93 | Ht 66.0 in | Wt 244.0 lb

## 2011-05-09 DIAGNOSIS — R202 Paresthesia of skin: Secondary | ICD-10-CM

## 2011-05-09 DIAGNOSIS — M545 Low back pain, unspecified: Secondary | ICD-10-CM | POA: Insufficient documentation

## 2011-05-09 DIAGNOSIS — M79609 Pain in unspecified limb: Secondary | ICD-10-CM | POA: Insufficient documentation

## 2011-05-09 DIAGNOSIS — I69959 Hemiplegia and hemiparesis following unspecified cerebrovascular disease affecting unspecified side: Secondary | ICD-10-CM | POA: Insufficient documentation

## 2011-05-09 DIAGNOSIS — R209 Unspecified disturbances of skin sensation: Secondary | ICD-10-CM | POA: Insufficient documentation

## 2011-05-09 DIAGNOSIS — G8110 Spastic hemiplegia affecting unspecified side: Secondary | ICD-10-CM

## 2011-05-09 DIAGNOSIS — M549 Dorsalgia, unspecified: Secondary | ICD-10-CM | POA: Insufficient documentation

## 2011-05-09 DIAGNOSIS — G8111 Spastic hemiplegia affecting right dominant side: Secondary | ICD-10-CM | POA: Insufficient documentation

## 2011-05-09 NOTE — Patient Instructions (Addendum)
Electromyography (EMG) Test This is a test in which very small electrodes are placed into your muscle tissue. It looks at the electrical impulses of your muscle tissue. This test is used to determine whether or not there are involuntary or spontaneous muscle movements. Involuntary or spontaneous means muscle movements that happen by themselves. This may indicate injury or disease of the nerves which supply that muscle. PREPARATION FOR TEST No preparation or fasting is necessary. Some stimulants such as caffeine and tobacco may need to be avoided for 2-3 hours before test or as instructed by your caregiver. NORMAL FINDINGS No evidence of neuromuscular abnormalities. Ranges for normal findings may vary among different laboratories and hospitals. You should always check with your doctor after having lab work or other tests done to discuss the meaning of your test results and whether your values are considered within normal limits. MEANING OF TEST  Your caregiver will go over the test results with you and discuss the importance and meaning of your results, as well as treatment options and the need for additional tests if necessary. OBTAINING THE TEST RESULTS It is your responsibility to obtain your test results. Ask the lab or department performing the test when and how you will get your results. Document Released: 05/02/2004 Document Revised: 12/19/2010 Document Reviewed: 12/10/2007 [employer] Patient Information 2012 [employer]. 

## 2011-05-09 NOTE — Progress Notes (Signed)
Subjective:    Patient ID: Tina [name], female    DOB: [birth year], 41 y.o.   MRN: [id number]  HPI  complaints today include low back pain going into the right thigh as well as right leg as well as right foot. No recent trauma that started this pain. She's had on and off pain in the past. She has fallen once on the treadmill but really didn't injure herself. She also fell on the steps once the once again denies any injury  patient complains of right hand pain and numbness first thing in the morning. She gets this when she bends her elbow for a prolonged period of time as well. She has no trauma to the right upper extremity per her report.   Pain Inventory Average Pain 7 Pain Right Now 5 My pain is sharp, burning, stabbing, tingling and aching  In the last 24 hours, has pain interfered with the following? General activity 10 Relation with others 7 Enjoyment of life 10 What TIME of day is your pain at its worst? all day Sleep (in general) Poor  Pain is worse with: walking, sitting and standing Pain improves with: rest, heat/ice and medication Relief from Meds: 6  Mobility walk without assistance use a cane how many minutes can you walk? 15 min ability to climb steps?  yes do you drive?  no  Function not employed: date last employed   Neuro/Psych weakness numbness tingling trouble walking spasms dizziness confusion depression anxiety  Prior Studies Any changes since last visit?  no  Physicians involved in your care Any changes since last visit?  no       Review of Systems  Constitutional: Positive for unexpected weight change.       High blood sugar, skin breakdown  Gastrointestinal: Positive for nausea.  Psychiatric/Behavioral: Positive for dysphoric mood.       Objective:   Physical Exam  Constitutional: She is oriented to person, place, and time. She appears well-developed.  Neurological: She is alert and oriented to person, place, and time. A  sensory deficit is present.  Reflex Scores:      Tricep reflexes are 3+ on the right side and 2+ on the left side.      Bicep reflexes are 3+ on the right side and 2+ on the left side.      Brachioradialis reflexes are 4+ on the right side and 2+ on the left side.      Patellar reflexes are 3+ on the right side and 2+ on the left side.      Achilles reflexes are 3+ on the right side and 2+ on the left side.       Right index finger non-compared to the fifth digit.  right lower remedy has diffuse numbness comparison left lower extremity  Motor strength is 4/5 in the right deltoid, biceps, triceps, grip as well as hip flexors knee extensors ankle dorsiflexors. She is 5/5 on the  left side  Psychiatric: She has a normal mood and affect.    Tinel's testing is negative over the wrist on the right side       Assessment & Plan:   1. History CVA causing a chronic right spastic hemiparesis. Some of her lower extremity symptoms may be related to her stroke however she does appear to have more back pain that radiates into the leg. We'll check an x-ray of this continue to advise activity. Reassess in one month if no better consider MRI 2. Right upper  extremity intermittent numbness in the median nerve distribution. We'll set her up for an EMG next month. If this is negative we may consider stroke as the primary etiology.

## 2011-06-05 ENCOUNTER — Ambulatory Visit (HOSPITAL_COMMUNITY)
Admission: RE | Admit: 2011-06-05 | Discharge: 2011-06-05 | Disposition: A | Discharge status: Home / Self Care | Payer: Federal, State, Local not specified - PPO | Source: Ambulatory Visit | Attending: Physical Medicine & Rehabilitation | Admitting: Physical Medicine & Rehabilitation

## 2011-06-05 DIAGNOSIS — M545 Low back pain, unspecified: Secondary | ICD-10-CM

## 2011-06-05 DIAGNOSIS — M79609 Pain in unspecified limb: Secondary | ICD-10-CM | POA: Insufficient documentation

## 2011-06-10 ENCOUNTER — Ambulatory Visit (HOSPITAL_BASED_OUTPATIENT_CLINIC_OR_DEPARTMENT_OTHER): Payer: Federal, State, Local not specified - PPO | Admitting: Physical Medicine & Rehabilitation

## 2011-06-10 ENCOUNTER — Encounter: Payer: Self-pay | Admitting: Physical Medicine & Rehabilitation

## 2011-06-10 ENCOUNTER — Encounter: Payer: Federal, State, Local not specified - PPO | Attending: Physical Medicine & Rehabilitation

## 2011-06-10 VITALS — BP 144/78 | HR 94 | Resp 16 | Ht 66.5 in | Wt 244.0 lb

## 2011-06-10 DIAGNOSIS — M549 Dorsalgia, unspecified: Secondary | ICD-10-CM | POA: Insufficient documentation

## 2011-06-10 DIAGNOSIS — R209 Unspecified disturbances of skin sensation: Secondary | ICD-10-CM

## 2011-06-10 DIAGNOSIS — I69959 Hemiplegia and hemiparesis following unspecified cerebrovascular disease affecting unspecified side: Secondary | ICD-10-CM | POA: Insufficient documentation

## 2011-06-10 DIAGNOSIS — M5431 Sciatica, right side: Secondary | ICD-10-CM

## 2011-06-10 DIAGNOSIS — M5430 Sciatica, unspecified side: Secondary | ICD-10-CM

## 2011-06-10 DIAGNOSIS — M79609 Pain in unspecified limb: Secondary | ICD-10-CM | POA: Insufficient documentation

## 2011-06-10 DIAGNOSIS — R202 Paresthesia of skin: Secondary | ICD-10-CM

## 2011-06-10 NOTE — Patient Instructions (Signed)
Take 10 mg of Valium prior to the MRI You'll see me in approximately one month Your EMG test showed no signs of carpal tunnel and no signs of pinched nerve in the neck

## 2011-06-10 NOTE — Progress Notes (Signed)
EMG performed 06/10/2011.  See EMG report under media tab.

## 2011-06-11 ENCOUNTER — Encounter: Payer: Self-pay | Admitting: Physical Medicine & Rehabilitation

## 2011-06-17 ENCOUNTER — Telehealth: Payer: Self-pay | Admitting: Physical Medicine & Rehabilitation

## 2011-06-17 NOTE — Telephone Encounter (Signed)
Scheduled MRI for 06/19/11.  Patient may reschedule.  Will need medication called in.

## 2011-06-17 NOTE — Telephone Encounter (Signed)
May call in Valium 10 mg 1 by mouth prior to MRI

## 2011-06-17 NOTE — Telephone Encounter (Signed)
Can we call something in for the pt. She is claustrophobic.

## 2011-06-18 MED ORDER — DIAZEPAM 10 MG PO TABS: 10.0000 mg | ORAL_TABLET | Freq: Once | ORAL | Status: AC

## 2011-06-18 NOTE — Telephone Encounter (Signed)
Rx has been called in, pt aware. 

## 2011-06-19 ENCOUNTER — Ambulatory Visit (HOSPITAL_COMMUNITY)
Admission: RE | Admit: 2011-06-19 | Discharge: 2011-06-19 | Disposition: A | Discharge status: Home / Self Care | Payer: Federal, State, Local not specified - PPO | Source: Ambulatory Visit | Attending: Physical Medicine & Rehabilitation | Admitting: Physical Medicine & Rehabilitation

## 2011-06-19 DIAGNOSIS — M545 Low back pain, unspecified: Secondary | ICD-10-CM | POA: Insufficient documentation

## 2011-06-19 DIAGNOSIS — R29898 Other symptoms and signs involving the musculoskeletal system: Secondary | ICD-10-CM | POA: Insufficient documentation

## 2011-06-19 DIAGNOSIS — R209 Unspecified disturbances of skin sensation: Secondary | ICD-10-CM | POA: Insufficient documentation

## 2011-06-19 DIAGNOSIS — D259 Leiomyoma of uterus, unspecified: Secondary | ICD-10-CM | POA: Insufficient documentation

## 2011-06-19 DIAGNOSIS — M5431 Sciatica, right side: Secondary | ICD-10-CM

## 2011-07-11 ENCOUNTER — Ambulatory Visit: Payer: Federal, State, Local not specified - PPO | Admitting: Physical Medicine & Rehabilitation

## 2011-07-11 ENCOUNTER — Ambulatory Visit (HOSPITAL_BASED_OUTPATIENT_CLINIC_OR_DEPARTMENT_OTHER): Payer: Federal, State, Local not specified - PPO | Admitting: Physical Medicine & Rehabilitation

## 2011-07-11 ENCOUNTER — Encounter: Payer: Federal, State, Local not specified - PPO | Attending: Physical Medicine & Rehabilitation

## 2011-07-11 ENCOUNTER — Encounter: Payer: Self-pay | Admitting: Physical Medicine & Rehabilitation

## 2011-07-11 VITALS — BP 146/59 | HR 93 | Resp 16 | Ht 66.5 in | Wt 246.0 lb

## 2011-07-11 DIAGNOSIS — M79609 Pain in unspecified limb: Secondary | ICD-10-CM | POA: Insufficient documentation

## 2011-07-11 DIAGNOSIS — G8111 Spastic hemiplegia affecting right dominant side: Secondary | ICD-10-CM

## 2011-07-11 DIAGNOSIS — I69959 Hemiplegia and hemiparesis following unspecified cerebrovascular disease affecting unspecified side: Secondary | ICD-10-CM | POA: Insufficient documentation

## 2011-07-11 DIAGNOSIS — R209 Unspecified disturbances of skin sensation: Secondary | ICD-10-CM | POA: Insufficient documentation

## 2011-07-11 DIAGNOSIS — IMO0002 Reserved for concepts with insufficient information to code with codable children: Secondary | ICD-10-CM

## 2011-07-11 DIAGNOSIS — G8110 Spastic hemiplegia affecting unspecified side: Secondary | ICD-10-CM

## 2011-07-11 DIAGNOSIS — M7550 Bursitis of unspecified shoulder: Secondary | ICD-10-CM

## 2011-07-11 DIAGNOSIS — M75100 Unspecified rotator cuff tear or rupture of unspecified shoulder, not specified as traumatic: Secondary | ICD-10-CM

## 2011-07-11 DIAGNOSIS — M549 Dorsalgia, unspecified: Secondary | ICD-10-CM | POA: Insufficient documentation

## 2011-07-11 MED ORDER — TIZANIDINE HCL 4 MG PO CAPS: 4.0000 mg | ORAL_CAPSULE | Freq: Two times a day (BID) | ORAL | Status: DC

## 2011-07-11 NOTE — Progress Notes (Signed)
Subjective:    Patient ID: Tina [name], female    DOB: October 31, 1970, 41 y.o.   MRN: [id number]  HPI  Right shoulder pain. Pain with overhead activities. Some neck pain as well. Pain radiates from the shoulder to the upper arm but not below the elbow Right leg pain about the same but not severe Pain Inventory Average Pain 8 Pain Right Now 8 My pain is sharp, stabbing, tingling and aching  In the last 24 hours, has pain interfered with the following? General activity 9 Relation with others 9 Enjoyment of life 9 What TIME of day is your pain at its worst? All Day Sleep (in general) Poor  Pain is worse with: walking, sitting, standing and some activites Pain improves with: rest and medication Relief from Meds: 7  Mobility walk with assistance use a cane how many minutes can you walk? 12 ability to climb steps?  yes do you drive?  no  Function not employed: date last employed  I need assistance with the following:  dressing, bathing, meal prep, household duties and shopping  Neuro/Psych weakness numbness tremor tingling trouble walking spasms dizziness depression anxiety  Prior Studies Any changes since last visit?  no  Physicians involved in your care Any changes since last visit?  no   Family History  Problem Relation Age of Onset  . Diabetes Father   . Kidney disease Father    History   Social History  . Marital Status: Married    Spouse Name: N/A    Number of Children: N/A  . Years of Education: N/A   Social History Main Topics  . Smoking status: Never Smoker   . Smokeless tobacco: None  . Alcohol Use: None  . Drug Use: None  . Sexually Active: None   Other Topics Concern  . None   Social History Narrative  . None   Past Surgical History  Procedure Date  . Cesarean section   . Uterine fibroid surgery   . Ureter revision    Past Medical History  Diagnosis Date  . Flaccid hemiplegia affecting dominant side   . Cerebral  thrombosis with cerebral infarction   . Enthesopathy of hip region   . Lumbago   . Spastic hemiplegia affecting dominant side   . Panic disorder without agoraphobia   . Stroke   . Depression   . Anxiety   . Diabetes mellitus   . Hypertension    BP 146/59  Pulse 93  Resp 16  Ht 5' 6.5" (1.689 m)  Wt 246 lb (111.585 kg)  BMI 39.11 kg/m2  SpO2 96%      Review of Systems  HENT: Negative.   Eyes: Negative.   Respiratory: Negative.   Cardiovascular: Negative.   Gastrointestinal: Negative.   Genitourinary: Negative.   Musculoskeletal: Positive for back pain and gait problem.  Skin: Negative.   Neurological: Positive for dizziness, tremors, weakness and numbness.       Tingling and Spasms  Psychiatric/Behavioral:       Depression/Anxiety       Objective:   Physical Exam  Musculoskeletal:       Right shoulder: She exhibits decreased range of motion, tenderness, pain and decreased strength. She exhibits no deformity and no spasm.       Motor strength is 3 minus at the deltoid bicep tricep grip as well as and and wrist flexion extension. Positive impingement sign in the right shoulder.          Assessment &  Plan:  1. Right subacromial bursitis in the setting of right hemiplegia due to CVA  2. Spasticity will reorder Zanaflex this is affecting both the right upper and right lower extremity 3. Back pain right lower extremity pain this is stroke related. Her MRI lumbar spine was normal.  Right shoulder injection Indication right subacromial bursitis not a good candidate for an nonsteroidal do to CVA Informed consent was obtained after describing risks and benefits of the procedure with the patient these include bleeding bruising and infection she elects to proceed and has given written consent Patient placed in a seated position lateral approach used. Area marked prepped with Betadine and alcohol. 25-gauge needle was inserted under the right acromion. After negative draw  back for blood 1 cc of 40 mg per cc Kenalog and 4 cc 1% lidocaine were injected. Patient tolerated procedure well. Post procedure instructions given indication

## 2011-07-11 NOTE — Patient Instructions (Signed)
This injection should start helping you in one to 2 days. If your shoulder pain comes back or never gets better we will need to do an ultrasound of the shoulder next visit

## 2011-07-29 ENCOUNTER — Other Ambulatory Visit: Payer: Self-pay | Admitting: Obstetrics

## 2011-07-29 DIAGNOSIS — R109 Unspecified abdominal pain: Secondary | ICD-10-CM

## 2011-07-30 ENCOUNTER — Ambulatory Visit
Admission: RE | Admit: 2011-07-30 | Discharge: 2011-07-30 | Disposition: A | Discharge status: Home / Self Care | Payer: Federal, State, Local not specified - PPO | Source: Ambulatory Visit | Attending: Obstetrics | Admitting: Obstetrics

## 2011-07-30 DIAGNOSIS — R109 Unspecified abdominal pain: Secondary | ICD-10-CM

## 2011-08-01 ENCOUNTER — Inpatient Hospital Stay (HOSPITAL_COMMUNITY): Payer: Federal, State, Local not specified - PPO

## 2011-08-01 ENCOUNTER — Inpatient Hospital Stay (HOSPITAL_COMMUNITY)
Admission: AD | Admit: 2011-08-01 | Discharge: 2011-08-02 | Disposition: A | Discharge status: Home / Self Care | Payer: Federal, State, Local not specified - PPO | Source: Ambulatory Visit | Attending: Emergency Medicine | Admitting: Emergency Medicine

## 2011-08-01 ENCOUNTER — Encounter (HOSPITAL_COMMUNITY): Payer: Self-pay | Admitting: *Deleted

## 2011-08-01 DIAGNOSIS — R1031 Right lower quadrant pain: Secondary | ICD-10-CM | POA: Insufficient documentation

## 2011-08-01 DIAGNOSIS — K5900 Constipation, unspecified: Secondary | ICD-10-CM

## 2011-08-01 DIAGNOSIS — K439 Ventral hernia without obstruction or gangrene: Secondary | ICD-10-CM | POA: Insufficient documentation

## 2011-08-01 LAB — URINALYSIS, ROUTINE W REFLEX MICROSCOPIC
Specific Gravity, Urine: 1.02 (ref 1.005–1.030)
Urobilinogen, UA: 0.2 mg/dL (ref 0.0–1.0)

## 2011-08-01 LAB — URINE MICROSCOPIC-ADD ON

## 2011-08-01 MED ORDER — ONDANSETRON HCL 4 MG/2ML IJ SOLN
4.0000 mg | Freq: Once | INTRAMUSCULAR | Status: AC
  Administered 2011-08-02: 4 mg via INTRAVENOUS
  Filled 2011-08-01: qty 2

## 2011-08-01 MED ORDER — MORPHINE SULFATE 4 MG/ML IJ SOLN
4.0000 mg | Freq: Once | INTRAMUSCULAR | Status: AC
  Administered 2011-08-02: 4 mg via INTRAVENOUS
  Filled 2011-08-01: qty 1

## 2011-08-01 MED ORDER — SODIUM CHLORIDE 0.9 % IV BOLUS (SEPSIS)
1000.0000 mL | Freq: Once | INTRAVENOUS | Status: AC
Start: 2011-08-01 — End: 2011-08-02
  Administered 2011-08-02: 1000 mL via INTRAVENOUS

## 2011-08-01 NOTE — ED Provider Notes (Signed)
History     CSN: [id number]  Arrival date & time 08/01/11  2130   First MD Initiated Contact with Patient 08/01/11 2259      Chief Complaint  Patient presents with  . Abdominal Pain  . Constipation   HPI  History provided by patient and recent medical charts. Patient is a 41 year old female with history of hypertension, diabetes, cesarean section who presents with symptoms of constipation, abdominal pain nausea vomiting symptoms. Patient reports constipation without bowel movement for the past one week. Began having persistent nausea vomiting symptoms over the past one to 2 days. She was seen by OB/GYN 2 days ago with outpatient CT scan showing a right ventral abdominal hernia. There were no signs for SBO that time. Patient was seen at [facility] hospital prior to arrival first persistent abdominal pain and nausea vomits symptoms. Patient was transferred here to be seen by general surgeon. Patient reports last meal around 6 PM but states that she vomited shortly after attempting to eat. Patient was not given any medications at [facility] hospital. Pain currently 7/10 primarily in the right lower abdomen but is also diffuse.    Past Medical History  Diagnosis Date  . Flaccid hemiplegia affecting dominant side   . Cerebral thrombosis with cerebral infarction   . Enthesopathy of hip region   . Lumbago   . Spastic hemiplegia affecting dominant side   . Panic disorder without agoraphobia   . Stroke   . Depression   . Anxiety   . Diabetes mellitus   . Hypertension     Past Surgical History  Procedure Date  . Cesarean section   . Uterine fibroid surgery   . Ureter revision     Family History  Problem Relation Age of Onset  . Diabetes Father   . Kidney disease Father   . Other Neg Hx     History  Substance Use Topics  . Smoking status: Never Smoker   . Smokeless tobacco: Not on file  . Alcohol Use: No    OB History    Grav Para Term Preterm Abortions TAB SAB Ect Mult Living     1 1 0 1 0 0 0 0 0 1       Review of Systems  Constitutional: Negative for fever.  Gastrointestinal: Positive for nausea, vomiting, abdominal pain and constipation.  All other systems reviewed and are negative.    Allergies  Contrast media; Iohexol; Midazolam hcl; Shellfish allergy; Avandia; Kiwi extract; Metformin and related; and Other  Home Medications   Current Outpatient Rx  Name Route Sig Dispense Refill  . ARIPIPRAZOLE 2 MG PO TABS Oral Take 2 mg by mouth daily.    . ASPIRIN 325 MG PO TABS Oral Take 325 mg by mouth daily. For pain    . EXFORGE HCT 10-320-25 MG PO TABS Oral Take 1 tablet by mouth daily.     Marland Kitchen GABAPENTIN 300 MG PO CAPS Oral Take 300 mg by mouth 3 (three) times daily.    . INSULIN GLARGINE 100 UNIT/ML [place] SOLN Subcutaneous Inject 20-22 Units into the skin 2 (two) times daily. Inject 22 units in the morning and 20 units in the evening    . INSULIN LISPRO (HUMAN) 100 UNIT/ML [place]Bishop SOLN Subcutaneous Inject 6-10 Units into the skin 3 (three) times daily before meals.     Marland Kitchen PERCOCET PO Oral Take 1 tablet by mouth 2 (two) times daily as needed. For pain    . TIZANIDINE HCL 4 MG PO CAPS  Oral Take 1 capsule (4 mg total) by mouth 2 (two) times daily. 60 capsule 5  . TRAMADOL HCL 50 MG PO TABS Oral Take 50 mg by mouth 3 (three) times daily.      BP 160/86  Pulse 94  Temp 98.3 F (36.8 C) (Oral)  Resp 16  Ht 5\' 6"  (1.676 m)  Wt 243 lb (110.224 kg)  BMI 39.22 kg/m2  LMP 07/30/2011  Physical Exam  Nursing note and vitals reviewed. Constitutional: She is oriented to person, place, and time. She appears well-developed and well-nourished. No distress.  HENT:  Head: Normocephalic.  Cardiovascular: Normal rate and regular rhythm.   Pulmonary/Chest: Effort normal and breath sounds normal.  Abdominal: Soft. There is tenderness in the right lower quadrant. There is no rebound.       Patient is obese limiting exam. No appreciable mass or hernia. Diffuse tenderness of the  abdomen greatest in right lower quadrant.  Neurological: She is alert and oriented to person, place, and time.  Skin: Skin is warm and dry. No rash noted.  Psychiatric: She has a normal mood and affect. Her behavior is normal.    ED Course  Procedures   Results for orders placed during the hospital encounter of 08/01/11  URINALYSIS, ROUTINE W REFLEX MICROSCOPIC      Component Value Range   Color, Urine YELLOW  YELLOW   APPearance CLEAR  CLEAR   Specific Gravity, Urine 1.020  1.005 - 1.030   pH 5.5  5.0 - 8.0   Glucose, UA >1000 (*) NEGATIVE mg/dL   Hgb urine dipstick LARGE (*) NEGATIVE   Bilirubin Urine NEGATIVE  NEGATIVE   Ketones, ur NEGATIVE  NEGATIVE mg/dL   Protein, ur 30 (*) NEGATIVE mg/dL   Urobilinogen, UA 0.2  0.0 - 1.0 mg/dL   Nitrite NEGATIVE  NEGATIVE   Leukocytes, UA NEGATIVE  NEGATIVE  URINE MICROSCOPIC-ADD ON      Component Value Range   Squamous Epithelial / LPF RARE  RARE   RBC / HPF 7-10  <3 RBC/hpf  CBC WITH DIFFERENTIAL      Component Value Range   WBC 8.2  4.0 - 10.5 K/uL   RBC 4.20  3.87 - 5.11 MIL/uL   Hemoglobin 10.8 (*) 12.0 - 15.0 g/dL   HCT 25.3 (*) 66.4 - 40.3 %   MCV 78.3  78.0 - 100.0 fL   MCH 25.7 (*) 26.0 - 34.0 pg   MCHC 32.8  30.0 - 36.0 g/dL   RDW 47.4  25.9 - 56.3 %   Platelets 319  150 - 400 K/uL   Neutrophils Relative 64  43 - 77 %   Neutro Abs 5.2  1.7 - 7.7 K/uL   Lymphocytes Relative 25  12 - 46 %   Lymphs Abs 2.1  0.7 - 4.0 K/uL   Monocytes Relative 8  3 - 12 %   Monocytes Absolute 0.6  0.1 - 1.0 K/uL   Eosinophils Relative 3  0 - 5 %   Eosinophils Absolute 0.2  0.0 - 0.7 K/uL   Basophils Relative 1  0 - 1 %   Basophils Absolute 0.0  0.0 - 0.1 K/uL      Dg Abd Acute W/chest  08/01/2011  *RADIOLOGY REPORT*  Clinical Data: Abdominal pain and constipation.  History of hypertension, diabetes, stroke.  Question of incarcerated hernia.  ACUTE ABDOMEN SERIES (ABDOMEN 2 VIEW & CHEST 1 VIEW)  Comparison: CT of the abdomen and  pelvis 07/30/2011  Findings:  The heart size is upper limits normal.  Lungs are clear. There is no free intraperitoneal air beneath the diaphragm.  There is residual contrast throughout nondilated colonic loops.  No evidence for small bowel dilatation.  Contrast is seen within the appendix.  There is a right lower quadrant phlebolith.  IMPRESSION: No evidence for small bowel obstruction.  Residual contrast in the colon.  The findings were discussed with Dr. Michaell Cowing on 08/01/2011 at 11:45 p.m.  Original Report Authenticated By: Patterson Hammersmith, M.D.     1. Constipation       MDM  11:00PM patient seen and evaluated. Patient states she was transferred to [facility] hospital to be seen by general surgeon. Patient found to have rate control her abdominal hernia on CT scan 2 days ago. Patient has had a week of constipation and more recently nausea vomiting symptoms. Patient had last meal around 6 PM. She vomited this up.  Patient was seen and evaluated by Dr. gross with general surgery. Patient has soft reducible hernia plain film does not show any signs for small bowel instruction, there is contrast into the large bowel. This time he recommends treating constipation with enemas and laxatives. We'll also treat pain.  Patient having some small amounts of bowel movement with enema. At this time she feels ready to return home we'll continue enemas and stool softeners. She will followup with PCP for followup with Dr. gross outpatient for hernia evaluation as needed.    Angus Seller, PA 08/02/11 0630

## 2011-08-01 NOTE — ED Notes (Signed)
Pt states she had her last BM on 7/10. Pt N/V

## 2011-08-01 NOTE — Progress Notes (Signed)
Dr Cherly Hensen notified of pt's admission and status. Will see pt

## 2011-08-01 NOTE — MAU Note (Signed)
Pt vomited.   

## 2011-08-01 NOTE — MAU Provider Note (Addendum)
History   cc. Right lower quadrant pain, vomiting  Chief Complaint  Patient presents with  . Abdominal Pain  . Constipation   41 yo G2P0101 MBF presents with c/o vomiting  Since yesterday and inability to have a BM over a week despite different regimen including enema. Pt also c/o RLQ pain radiating to back which has gotten worse and feels like a knot. Pt has noted pain since C/S @ [place]. No urinary sx or fever> Pt was seen in office by Dr Ernestina Penna who ordered CT scan. CT scan(7/17) revealed small right lower quadrant ventral hernia containing small bowels. Pt has vomited here twice since arrival  OB History    Grav Para Term Preterm Abortions TAB SAB Ect Mult Living   1 1 0 1 0 0 0 0 0 1       Past Medical History  Diagnosis Date  . Flaccid hemiplegia affecting dominant side   . Cerebral thrombosis with cerebral infarction   . Enthesopathy of hip region   . Lumbago   . Spastic hemiplegia affecting dominant side   . Panic disorder without agoraphobia   . Stroke   . Depression   . Anxiety   . Diabetes mellitus   . Hypertension     Past Surgical History  Procedure Date  . Cesarean section   . Uterine fibroid surgery   . Ureter revision     Family History  Problem Relation Age of Onset  . Diabetes Father   . Kidney disease Father   . Other Neg Hx     History  Substance Use Topics  . Smoking status: Never Smoker   . Smokeless tobacco: Not on file  . Alcohol Use: No    Allergies:  Allergies  Allergen Reactions  . Contrast Media (Iodinated Diagnostic Agents) Other (See Comments)    Difficulty breathing  . Iohexol Hives, Nausea And Vomiting and Swelling     Desc: Magnevist-gadolinium-difficulty breathing, throat swelling   . Midazolam Hcl Other (See Comments)    Difficulty breathing  . Shellfish Allergy Anaphylaxis  . Avandia (Rosiglitazone Maleate)     Patient doesn't remember reaction  . Kiwi Extract Itching and Swelling  . Metformin And Related Nausea  And Vomiting and Other (See Comments)    diarrhea  . Other Itching    Prescriptions prior to admission  Medication Sig Dispense Refill  . ARIPiprazole (ABILIFY) 2 MG tablet Take 2 mg by mouth daily.      Marland Kitchen aspirin 325 MG tablet Take 325 mg by mouth daily. For pain      . EXFORGE HCT 10-320-25 MG TABS Take 1 tablet by mouth daily.       Marland Kitchen gabapentin (NEURONTIN) 300 MG capsule Take 300 mg by mouth 3 (three) times daily.      . insulin glargine (LANTUS) 100 UNIT/ML injection Inject 20-22 Units into the skin 2 (two) times daily. Inject 22 units in the morning and 20 units in the evening      . insulin lispro (HUMALOG) 100 UNIT/ML injection Inject 6-10 Units into the skin 3 (three) times daily before meals.       . Oxycodone-Acetaminophen (PERCOCET PO) Take 1 tablet by mouth 2 (two) times daily as needed. For pain      . tiZANidine (ZANAFLEX) 4 MG capsule Take 1 capsule (4 mg total) by mouth 2 (two) times daily.  60 capsule  5  . traMADol (ULTRAM) 50 MG tablet Take 50 mg by mouth 3 (three) times daily.  Physical Exam   Blood pressure 155/77, pulse 96, temperature 98.4 F (36.9 C), temperature source Oral, resp. rate 20, height 5' 6.25" (1.683 m), weight 243 lb (110.224 kg), last menstrual period 07/30/2011.  General appearance: alert, cooperative, mild distress and morbidly obese Lungs: clear to auscultation bilaterally Heart: regular rate and rhythm, S1, S2 normal, no murmur, click, rub or gallop Abdomen: obese, hypoactive BS tender throughout but markedly tender RLQ under pannus./ (+) pfannensteil skin incision (-) rebound Pelvic: deferred Extremities: no edema, redness or tenderness in the calves or thighs  CT scan reviewed; 5.9 cm cystic left adnexal lesion ( f/u after 2 MP),  3 cm fibroid(post), ventral hernia  right lower quad with small bowels  ED Course  RLQ pain Right ventral hernia w/ small bowel ? Obstruction  P) general surgery evaluation( spoke w/ Dr. Michaell Cowing). Will  have pt follow up in office for pelvic sono f/u of left adnexal cyst MDM  Irby Fails A, MD 9:16 PM 08/01/2011      Currently on cycle. sono D7-10 of 09/2011 cycle

## 2011-08-01 NOTE — ED Notes (Signed)
ZOX:[id number]<AV> Expected date:08/01/11<BR> Expected time: 9:52 PM<BR> Means of arrival:Ambulance<BR> Comments:<BR> Transfer from [facility]

## 2011-08-01 NOTE — Progress Notes (Signed)
Pt transferred via [facility] to [facility] 

## 2011-08-01 NOTE — MAU Note (Signed)
Pt states, " I've had pain in my right lower abdomen since a C/S last year but it has been worse for two months and since Thursday a week ago it has felt like a knot in my right low abdomen and it gets hard. I had a CT scan last Wed, but haven't got the results. I haven't had a bowel movement for over a week now. I used, stool softeners, miralax, suppository's  And three fleets and still haven't had a BM. "

## 2011-08-02 DIAGNOSIS — K5900 Constipation, unspecified: Secondary | ICD-10-CM

## 2011-08-02 DIAGNOSIS — K432 Incisional hernia without obstruction or gangrene: Secondary | ICD-10-CM

## 2011-08-02 DIAGNOSIS — R112 Nausea with vomiting, unspecified: Secondary | ICD-10-CM

## 2011-08-02 LAB — BASIC METABOLIC PANEL
Chloride: 98 mEq/L (ref 96–112)
GFR calc Af Amer: >90 mL/min (ref >90)
Potassium: 4.1 mEq/L (ref 3.5–5.1)
Sodium: 134 mEq/L — ABNORMAL LOW (ref 135–145)

## 2011-08-02 LAB — CBC WITH DIFFERENTIAL/PLATELET
Basophils Absolute: 0 10*3/uL (ref 0.0–0.1)
Basophils Relative: 1 % (ref 0–1)
Hemoglobin: 10.8 g/dL — ABNORMAL LOW (ref 12.0–15.0)
MCHC: 32.8 g/dL (ref 30.0–36.0)
Monocytes Relative: 8 % (ref 3–12)
Neutro Abs: 5.2 10*3/uL (ref 1.7–7.7)
Neutrophils Relative %: 64 % (ref 43–77)
Platelets: 319 10*3/uL (ref 150–400)
RDW: 14.4 % (ref 11.5–15.5)

## 2011-08-02 MED ORDER — FLEET ENEMA 7-19 GM/118ML RE ENEM
1.0000 | ENEMA | Freq: Once | RECTAL | Status: AC
  Administered 2011-08-02: 1 via RECTAL
  Filled 2011-08-02: qty 1

## 2011-08-02 MED ORDER — SORBITOL 70 % SOLN
960.0000 mL | TOPICAL_OIL | Freq: Once | ORAL | Status: DC
  Filled 2011-08-02: qty 240

## 2011-08-02 MED ORDER — LACTATED RINGERS IV BOLUS (SEPSIS)
1000.0000 mL | Freq: Once | INTRAVENOUS | Status: AC
  Administered 2011-08-02: 1000 mL via INTRAVENOUS

## 2011-08-02 MED ORDER — MAGNESIUM CITRATE PO SOLN
1.0000 | Freq: Once | ORAL | Status: AC
  Administered 2011-08-02: 1 via ORAL
  Filled 2011-08-02: qty 296

## 2011-08-02 NOTE — Consult Note (Signed)
Tina [name] is an 41 y.o. female.   Patient Care Team: Michiel Sites, MD as PCP - General  Reason for evaluation: Constipation nausea and vomiting.  Known incisional hernia.  Rule out incarceration/obstruction.  HPI: Obese female 17 months from emergent cesarean section done in [place].  Noticed some firmness on the right side of her incision.  Then became more obvious lump over a year ago.  Usually soft reducible.  Began to have lower abdominal pain.  Constipated for seven days.  Saw a gynecologist in town (Dr. Ernestina Penna).  CT scan was ordered.  Narcotics were written but she claims she never used them.  CT scan showed a loop of small bowel in a right lower corner incisional hernia.  No evidence of incarceration or obstruction.  Contrast flowed towards the hepatic flexure of the colon.  She began to get worsening nausea and vomiting last night.  Came to [facility] emergency room.  Based on the CT scan findings Dr. Cherly Hensen wished emergent surgical evaluation.  Patient was found to have a left ovarian cyst.  Dr. Cherly Hensen recommended ultrasound followup.  She did not think it was the source of her problems  Patient does have some flatus.  Dwindling PO tolerance especially today.  No sick contacts or travel history.  She claims she normally has two or three soft bowel movements a day.  Past Medical History  Diagnosis Date  . Flaccid hemiplegia affecting dominant side   . Cerebral thrombosis with cerebral infarction   . Enthesopathy of hip region   . Lumbago   . Spastic hemiplegia affecting dominant side   . Panic disorder without agoraphobia   . Stroke   . Depression   . Anxiety   . Diabetes mellitus   . Hypertension     Past Surgical History  Procedure Date  . Cesarean section   . Uterine fibroid surgery   . Ureter revision     Family History  Problem Relation Age of Onset  . Diabetes Father   . Kidney disease Father   . Other Neg Hx     Social History:  reports that  she has never smoked. She does not have any smokeless tobacco history on file. She reports that she does not drink alcohol or use illicit drugs.  Allergies:  Allergies  Allergen Reactions  . Contrast Media (Iodinated Diagnostic Agents) Other (See Comments)    Difficulty breathing  . Iohexol Hives, Nausea And Vomiting and Swelling     Desc: Magnevist-gadolinium-difficulty breathing, throat swelling   . Midazolam Hcl Other (See Comments)    Difficulty breathing  . Shellfish Allergy Anaphylaxis  . Avandia (Rosiglitazone Maleate)     Patient doesn't remember reaction  . Kiwi Extract Itching and Swelling  . Metformin And Related Nausea And Vomiting and Other (See Comments)    diarrhea  . Other Itching    Medications: Prior to Admission:  (Not in a hospital admission)  Results for orders placed during the hospital encounter of 08/01/11 (from the past 48 hour(s))  URINALYSIS, ROUTINE W REFLEX MICROSCOPIC     Status: Abnormal   Collection Time   08/01/11  7:44 PM      Component Value Range Comment   Color, Urine YELLOW  YELLOW    APPearance CLEAR  CLEAR    Specific Gravity, Urine 1.020  1.005 - 1.030    pH 5.5  5.0 - 8.0    Glucose, UA >1000 (*) NEGATIVE mg/dL    Hgb urine dipstick  LARGE (*) NEGATIVE    Bilirubin Urine NEGATIVE  NEGATIVE    Ketones, ur NEGATIVE  NEGATIVE mg/dL    Protein, ur 30 (*) NEGATIVE mg/dL    Urobilinogen, UA 0.2  0.0 - 1.0 mg/dL    Nitrite NEGATIVE  NEGATIVE    Leukocytes, UA NEGATIVE  NEGATIVE   URINE MICROSCOPIC-ADD ON     Status: Normal   Collection Time   08/01/11  7:44 PM      Component Value Range Comment   Squamous Epithelial / LPF RARE  RARE    RBC / HPF 7-10  <3 RBC/hpf   CBC WITH DIFFERENTIAL     Status: Abnormal   Collection Time   08/01/11 11:54 PM      Component Value Range Comment   WBC 8.2  4.0 - 10.5 K/uL    RBC 4.20  3.87 - 5.11 MIL/uL    Hemoglobin 10.8 (*) 12.0 - 15.0 g/dL    HCT 08.6 (*) 57.8 - 46.0 %    MCV 78.3  78.0 - 100.0  fL    MCH 25.7 (*) 26.0 - 34.0 pg    MCHC 32.8  30.0 - 36.0 g/dL    RDW 46.9  62.9 - 52.8 %    Platelets 319  150 - 400 K/uL    Neutrophils Relative 64  43 - 77 %    Neutro Abs 5.2  1.7 - 7.7 K/uL    Lymphocytes Relative 25  12 - 46 %    Lymphs Abs 2.1  0.7 - 4.0 K/uL    Monocytes Relative 8  3 - 12 %    Monocytes Absolute 0.6  0.1 - 1.0 K/uL    Eosinophils Relative 3  0 - 5 %    Eosinophils Absolute 0.2  0.0 - 0.7 K/uL    Basophils Relative 1  0 - 1 %    Basophils Absolute 0.0  0.0 - 0.1 K/uL   BASIC METABOLIC PANEL     Status: Abnormal   Collection Time   08/01/11 11:54 PM      Component Value Range Comment   Sodium 134 (*) 135 - 145 mEq/L    Potassium 4.1  3.5 - 5.1 mEq/L    Chloride 98  96 - 112 mEq/L    CO2 27  19 - 32 mEq/L    Glucose, Bld 187 (*) 70 - 99 mg/dL    BUN 14  6 - 23 mg/dL    Creatinine, Ser 4.13  0.50 - 1.10 mg/dL    Calcium 9.3  8.4 - 24.4 mg/dL    GFR calc non Af Amer >90  >90 mL/min    GFR calc Af Amer >90  >90 mL/min     Dg Abd Acute W/chest  08/01/2011  *RADIOLOGY REPORT*  Clinical Data: Abdominal pain and constipation.  History of hypertension, diabetes, stroke.  Question of incarcerated hernia.  ACUTE ABDOMEN SERIES (ABDOMEN 2 VIEW & CHEST 1 VIEW)  Comparison: CT of the abdomen and pelvis 07/30/2011  Findings: The heart size is upper limits normal.  Lungs are clear. There is no free intraperitoneal air beneath the diaphragm.  There is residual contrast throughout nondilated colonic loops.  No evidence for small bowel dilatation.  Contrast is seen within the appendix.  There is a right lower quadrant phlebolith.  IMPRESSION: No evidence for small bowel obstruction.  Residual contrast in the colon.  The findings were discussed with Dr. Michaell Cowing on 08/01/2011 at 11:45 p.m.  Original Report  Authenticated By: Patterson Hammersmith, M.D.    Review of Systems  Constitutional: Negative for fever, chills, weight loss and malaise/fatigue.  HENT: Negative for neck pain  and ear discharge.   Eyes: Negative for double vision and photophobia.  Respiratory: Negative for cough and shortness of breath.   Cardiovascular: Negative for chest pain, palpitations, orthopnea and leg swelling.  Gastrointestinal: Positive for nausea, vomiting, abdominal pain and constipation. Negative for heartburn, diarrhea, blood in stool and melena.       No personal nor family history of GI/colon cancer, inflammatory bowel disease, irritable bowel syndrome, allergy such as Celiac Sprue, dietary/dairy problems, colitis, ulcers nor gastritis.    No recent sick contacts/gastroenteritis.  No travel outside the country.  No changes in diet.    Genitourinary: Negative for dysuria and urgency.  Musculoskeletal: Positive for myalgias and back pain. Negative for falls.  Skin: Negative for itching and rash.  Neurological: Negative for dizziness, speech change and headaches.  Endo/Heme/Allergies: Negative for polydipsia. Does not bruise/bleed easily.  Psychiatric/Behavioral: Negative for suicidal ideas and substance abuse.   Blood pressure 160/86, pulse 94, temperature 98.3 F (36.8 C), temperature source Oral, resp. rate 16, height 5\' 6"  (1.676 m), weight 243 lb (110.224 kg), last menstrual period 07/30/2011. Physical Exam  Constitutional: She is oriented to person, place, and time. She appears well-developed and well-nourished.  Non-toxic appearance. She does not have a sickly appearance. She appears ill. She appears distressed.  HENT:  Head: Normocephalic and atraumatic.  Nose: Nose normal.  Mouth/Throat: Oropharynx is clear and moist. No oropharyngeal exudate.  Eyes: Conjunctivae and EOM are normal. Pupils are equal, round, and reactive to light. Right eye exhibits no discharge. Left eye exhibits no discharge. No scleral icterus.  Neck: Normal range of motion. Neck supple. No thyromegaly present.  Cardiovascular: Regular rhythm and intact distal pulses.   Respiratory: Effort normal. No  respiratory distress.  GI: Soft. Bowel sounds are normal. She exhibits mass. She exhibits no shifting dullness and no distension. There is tenderness in the suprapubic area. There is no rigidity, no rebound, no guarding, no CVA tenderness and negative Murphy's sign. A hernia is present. Hernia confirmed negative in the right inguinal area and confirmed negative in the left inguinal area.         Moderate panniculus.  Musculoskeletal: Normal range of motion. She exhibits no edema.  Lymphadenopathy:       Right: No inguinal adenopathy present.       Left: No inguinal adenopathy present.  Neurological: She is oriented to person, place, and time. No cranial nerve deficit.  Skin: Skin is warm and dry.  Psychiatric: She has a normal mood and affect. Her behavior is normal.    Assessment: VWH incisional hernia  I do not think that she has an incarcerated hernia.  It is soft and reducible.  Also, the oral contrast she took two days ago is now in the sigmoid colon.  There is no dilated small bowel loops or perforation or free air on the plain films today.  I reviewed the 3way films with Dr. Rosalie Gums, radiologist.  I suspect she needs a more aggressive bowel regimen to help compensate for her constipation.  I would start with some enemas.  Consider a small enema to help clean her out from below.  Perhaps the barium is making things worse.  Aggressively hydrate.    Nausea control  If she has good response with the enema is moving her bowels and feeling well,  she can followup with [name] to consider elective repair of her right lower quadrant hernia once her dehydration has resolved her constipation has resolved.  I do not think it's in her best interest to do any vigorous reaction in the middle night when she is sick.  The patient is stable.  There is no evidence of peritonitis, acute abdomen, nor shock.  There is no strong evidence of failure of improvement nor decline with current non-operative  management.  There is no need for surgery at the present moment.  We will continue to follow.  If she gets no response, she may require admission for her other health issues as well.  If markedly worse or hernia is hard and nonreducible, then consider surgery:  The anatomy & physiology of the digestive tract was discussed.  The pathophysiology of perforation was discussed.  Differential diagnosis such as perforated ulcer or colon, etc was discussed.   Natural history risks without surgery such as death was discussed.  I recommended abdominal exploration to diagnose & treat the source of the problem.  Laparoscopic & open techniques were discussed.   Risks such as bleeding, infection, abscess, leak, reoperation, bowel resection, possible ostomy, hernia, heart attack, death, and other risks were discussed.   The risks of no intervention will lead to serious problems including death.   I expressed a good likelihood that surgery will address the problem.   Goals of post-operative recovery were discussed as well.  We will work to minimize complications although risks in an emergent setting are high.   Questions were answered.  The patient & husband expressed understanding       She her husband agree.     Plan:   Vallory Oetken C. 08/02/2011, 12:48 AM

## 2011-08-02 NOTE — ED Provider Notes (Signed)
Medical screening examination/treatment/procedure(s) were performed by non-physician practitioner and as supervising physician I was immediately available for consultation/collaboration.   Hanley Seamen, MD 08/02/11 [phone]

## 2011-08-07 ENCOUNTER — Telehealth (INDEPENDENT_AMBULATORY_CARE_PROVIDER_SITE_OTHER): Payer: Self-pay | Admitting: General Surgery

## 2011-08-07 NOTE — Telephone Encounter (Signed)
Pt has appt to see SG in office after consult visit at the hospital.  She went to ED for abdominal pain and constipation, the latter of which is chronic.  Discussed increasing overall po fluids, especially fruit juices (grape, white grape, prune, etc) and taking Miralax.  Pt states she takes Miralax daily, so suggested she increase to BID, or 2 capfuls instead of 1.  Pt understands that the pain medicine is constipating, so is in a "vicious cycle."  She understands and will implement suggestions.

## 2011-08-25 ENCOUNTER — Encounter: Payer: Self-pay | Admitting: Physical Medicine & Rehabilitation

## 2011-08-25 ENCOUNTER — Encounter: Payer: Federal, State, Local not specified - PPO | Attending: Physical Medicine & Rehabilitation

## 2011-08-25 ENCOUNTER — Ambulatory Visit (HOSPITAL_BASED_OUTPATIENT_CLINIC_OR_DEPARTMENT_OTHER): Payer: Federal, State, Local not specified - PPO | Admitting: Physical Medicine & Rehabilitation

## 2011-08-25 ENCOUNTER — Other Ambulatory Visit: Payer: Self-pay | Admitting: *Deleted

## 2011-08-25 VITALS — BP 140/62 | HR 74 | Resp 14 | Ht 66.0 in | Wt 243.6 lb

## 2011-08-25 DIAGNOSIS — M79609 Pain in unspecified limb: Secondary | ICD-10-CM | POA: Insufficient documentation

## 2011-08-25 DIAGNOSIS — M75120 Complete rotator cuff tear or rupture of unspecified shoulder, not specified as traumatic: Secondary | ICD-10-CM

## 2011-08-25 DIAGNOSIS — M75100 Unspecified rotator cuff tear or rupture of unspecified shoulder, not specified as traumatic: Secondary | ICD-10-CM

## 2011-08-25 DIAGNOSIS — R209 Unspecified disturbances of skin sensation: Secondary | ICD-10-CM | POA: Insufficient documentation

## 2011-08-25 DIAGNOSIS — M549 Dorsalgia, unspecified: Secondary | ICD-10-CM | POA: Insufficient documentation

## 2011-08-25 DIAGNOSIS — I69959 Hemiplegia and hemiparesis following unspecified cerebrovascular disease affecting unspecified side: Secondary | ICD-10-CM | POA: Insufficient documentation

## 2011-08-25 NOTE — Progress Notes (Signed)
Right shoulder complete ultrasound examination Indication is right shoulder pain for several months duration. Prior CVA with mild right hemiparesis no recent trauma. Patient was placed in a seated position. 12 Hz linear transducer utilized Short axis views of the right bicipital groove showed no abnormalities. Long axis views showed no evidence of tendon thickening or atresia or Tina synovitis Long axis view of the right supraspinatus shows a mid substance tear proximal to the insertion. No abnormal amount of fluid was seen in the subacromial bursa. Short axis view the right supraspinatus had difficulty visualizing the same tear given the relatively proximal location Long axis view the right infraspinatus tendon was normal Short axis view the right infraspinatus tendon was normal Long axis view of the right a.c. Joint was normal  Impression 1. Abnormal study  2. Probable right supraspinatus mid substance tear proximal to the insertion. There is some corresponding arthropathy around the right humerus  Plan will be to refer to physical therapy. If no appreciable improvement would consider ultrasound-guided shoulder injection

## 2011-08-25 NOTE — Patient Instructions (Addendum)
You have a partial tear of your right supraspinatus tendon. This is also called a rotator cuff tear. I advised no overhead lifting activities. I have referred you to physical therapy to the left some of the muscles around the shoulder with special attention not to aggravate the tear.

## 2011-09-02 ENCOUNTER — Encounter (INDEPENDENT_AMBULATORY_CARE_PROVIDER_SITE_OTHER): Payer: Self-pay | Admitting: Surgery

## 2011-09-02 ENCOUNTER — Ambulatory Visit (INDEPENDENT_AMBULATORY_CARE_PROVIDER_SITE_OTHER): Payer: Federal, State, Local not specified - PPO | Admitting: Surgery

## 2011-09-02 VITALS — BP 170/90 | HR 88 | Temp 97.6°F | Resp 16 | Ht 66.0 in | Wt 242.2 lb

## 2011-09-02 DIAGNOSIS — K432 Incisional hernia without obstruction or gangrene: Secondary | ICD-10-CM

## 2011-09-02 DIAGNOSIS — E669 Obesity, unspecified: Secondary | ICD-10-CM

## 2011-09-02 HISTORY — DX: Incisional hernia without obstruction or gangrene: K43.2

## 2011-09-02 NOTE — Progress Notes (Signed)
Subjective:     Patient ID: Tina [name], female   DOB: 1970/05/26, 41 y.o.   MRN: [id number]  HPI  Desirre AVANGELINE STOCKBURGER  1970/08/12 [id number]  Patient Care Team: Michiel Sites, MD as PCP - General Serita Kyle, MD as Consulting Physician (Obstetrics and Gynecology)  This patient is a 41 y.o.female who presents today for surgical evaluation.   Reason for evaluation: Followup on incisional hernia and right lower quadrant.  The patient comes in today feeling okay.  Sensitive in her right lower quadrant at times.  Constipation under better control with increasing the MiraLAX.  Still using occasional narcotics for neuralgias and discomfort.  Eating well.  More active.  No new events  Patient Active Problem List  Diagnosis  . Right spastic hemiparesis  . Lumbago  . Paresthesias  . Sciatica neuralgia, right  . Paresthesias in right hand  . Rotator cuff tear  . Hernia, incisional, RLQ  . Obesity (BMI 30-39.9)    Past Medical History  Diagnosis Date  . Flaccid hemiplegia affecting dominant side   . Cerebral thrombosis with cerebral infarction   . Enthesopathy of hip region   . Lumbago   . Spastic hemiplegia affecting dominant side   . Panic disorder without agoraphobia   . Stroke   . Depression   . Anxiety   . Diabetes mellitus   . Hypertension   . Anemia   . Blood transfusion     Past Surgical History  Procedure Date  . Cesarean section   . Uterine fibroid surgery   . Ureter revision     History   Social History  . Marital Status: Married    Spouse Name: N/A    Number of Children: N/A  . Years of Education: N/A   Occupational History  . Not on file.   Social History Main Topics  . Smoking status: Never Smoker   . Smokeless tobacco: Never Used  . Alcohol Use: No  . Drug Use: No  . Sexually Active: Yes    Birth Control/ Protection: None   Other Topics Concern  . Not on file   Social History Narrative  . No narrative on file     Family History  Problem Relation Age of Onset  . Diabetes Father   . Kidney disease Father   . Other Neg Hx   . Diabetes Maternal Grandmother   . Hyperlipidemia Paternal Grandmother   . Stroke Paternal Grandmother     Current Outpatient Prescriptions  Medication Sig Dispense Refill  . aspirin 325 MG tablet Take 325 mg by mouth daily. For pain      . B-D ULTRAFINE III SHORT PEN 31G X 8 MM MISC       . EXFORGE HCT 10-320-25 MG TABS Take 1 tablet by mouth daily.       Marland Kitchen gabapentin (NEURONTIN) 300 MG capsule Take 300 mg by mouth 3 (three) times daily.      . insulin glargine (LANTUS) 100 UNIT/ML injection Inject 20-22 Units into the skin 2 (two) times daily. Inject 22 units in the morning and 20 units in the evening      . insulin lispro (HUMALOG) 100 UNIT/ML injection Inject 6-10 Units into the skin 3 (three) times daily before meals.       . medroxyPROGESTERone (PROVERA) 10 MG tablet Take 1 tablet by mouth Daily.      . polyethylene glycol (MIRALAX / GLYCOLAX) packet Take 17 g by mouth daily.      Marland Kitchen  tiZANidine (ZANAFLEX) 4 MG capsule Take 1 capsule (4 mg total) by mouth 2 (two) times daily.  60 capsule  5  . traMADol (ULTRAM) 50 MG tablet Take 50 mg by mouth 3 (three) times daily.      . furosemide (LASIX) 40 MG tablet Take 1 tablet by mouth as needed.         Allergies  Allergen Reactions  . Contrast Media (Iodinated Diagnostic Agents) Other (See Comments)    Difficulty breathing  . Iohexol Hives, Nausea And Vomiting and Swelling     Desc: Magnevist-gadolinium-difficulty breathing, throat swelling   . Midazolam Hcl Other (See Comments)    Difficulty breathing  . Shellfish Allergy Anaphylaxis  . Avandia (Rosiglitazone Maleate)     Patient doesn't remember reaction  . Kiwi Extract Itching and Swelling  . Metformin And Related Nausea And Vomiting and Other (See Comments)    diarrhea  . Other Itching    BP 170/90  Pulse 88  Temp 97.6 F (36.4 C) (Temporal)  Resp 16   Ht 5\' 6"  (1.676 m)  Wt 242 lb 3.2 oz (109.861 kg)  BMI 39.09 kg/m2  LMP 07/23/2011  No results found.   Review of Systems  Constitutional: Negative for fever, chills, diaphoresis, appetite change and fatigue.  HENT: Negative for ear pain, sore throat, trouble swallowing, neck pain and ear discharge.   Eyes: Negative for photophobia, discharge and visual disturbance.  Respiratory: Negative for cough, choking, chest tightness and shortness of breath.   Cardiovascular: Negative for chest pain and palpitations.  Gastrointestinal: Negative for nausea, vomiting, abdominal pain, diarrhea, constipation, anal bleeding and rectal pain.  Genitourinary: Negative for dysuria, frequency and difficulty urinating.  Musculoskeletal: Negative for myalgias and gait problem.  Skin: Negative for color change, pallor and rash.  Neurological: Negative for dizziness, speech difficulty, weakness and numbness.  Hematological: Negative for adenopathy.  Psychiatric/Behavioral: Negative for confusion and agitation. The patient is not nervous/anxious.        Objective:   Physical Exam  Constitutional: She is oriented to person, place, and time. She appears well-developed and well-nourished. No distress.  HENT:  Head: Normocephalic.  Mouth/Throat: Oropharynx is clear and moist. No oropharyngeal exudate.  Eyes: Conjunctivae and EOM are normal. Pupils are equal, round, and reactive to light. No scleral icterus.  Neck: Normal range of motion. Neck supple. No tracheal deviation present.  Cardiovascular: Normal rate, regular rhythm and intact distal pulses.   Pulmonary/Chest: Effort normal and breath sounds normal. No respiratory distress. She exhibits no tenderness.  Abdominal: Soft. She exhibits no distension and no mass. There is no tenderness. Hernia confirmed negative in the right inguinal area and confirmed negative in the left inguinal area.    Genitourinary: No vaginal discharge found.  Musculoskeletal:  Normal range of motion. She exhibits no tenderness.  Lymphadenopathy:    She has no cervical adenopathy.       Right: No inguinal adenopathy present.       Left: No inguinal adenopathy present.  Neurological: She is alert and oriented to person, place, and time. No cranial nerve deficit. She exhibits normal muscle tone. Coordination normal.  Skin: Skin is warm and dry. No rash noted. She is not diaphoretic. No erythema.  Psychiatric: She has a normal mood and affect. Her behavior is normal. Judgment and thought content normal.       Assessment:     RLQ VWH incisional    Plan:     Lap repair with ONQ pump & at  least overnight stay:  The anatomy & physiology of the abdominal wall was discussed.  The pathophysiology of hernias was discussed.  Natural history risks without surgery including progeressive enlargement, pain, incarceration & strangulation was discussed.   Contributors to complications such as smoking, obesity, diabetes, prior surgery, etc were discussed.   I feel the risks of no intervention will lead to serious problems that outweigh the operative risks; therefore, I recommended surgery to reduce and repair the hernia.  I explained laparoscopic techniques with possible need for an open approach.  I noted the probable use of mesh to patch and/or buttress the hernia repair  Risks such as bleeding, infection, abscess, need for further treatment, heart attack, death, and other risks were discussed.  I noted a good likelihood this will help address the problem.   Goals of post-operative recovery were discussed as well.  Possibility that this will not correct all symptoms was explained.  I stressed the importance of low-impact activity, aggressive pain control, avoiding constipation, & not pushing through pain to minimize risk of post-operative chronic pain or injury. Possibility of reherniation especially with smoking, obesity, diabetes, immunosuppression, and other health conditions was  discussed.  We will work to minimize complications.     An educational handout further explaining the pathology & treatment options was given as well.  Questions were answered.  The patient expresses understanding & wishes to proceed with surgery.

## 2011-09-02 NOTE — Patient Instructions (Addendum)
See the Handout(s) we gave you.  Consider surgery.  Please call our office at [phone] if you wish to schedule surgery or if you have further questions / concerns.    Hernia A hernia occurs when an internal organ pushes out through a weak spot in the abdominal wall. Hernias most commonly occur in the groin and around the navel. Hernias often can be pushed back into place (reduced). Most hernias tend to get worse over time. Some abdominal hernias can get stuck in the opening (irreducible or incarcerated hernia) and cannot be reduced. An irreducible abdominal hernia which is tightly squeezed into the opening is at risk for impaired blood supply (strangulated hernia). A strangulated hernia is a medical emergency. Because of the risk for an irreducible or strangulated hernia, surgery may be recommended to repair a hernia. CAUSES   Heavy lifting.   Prolonged coughing.   Straining to have a bowel movement.   A cut (incision) made during an abdominal surgery.  HOME CARE INSTRUCTIONS   Bed rest is not required. You may continue your normal activities.   Avoid lifting more than 10 pounds (4.5 kg) or straining.   Cough gently. If you are a smoker it is best to stop. Even the best hernia repair can break down with the continual strain of coughing. Even if you do not have your hernia repaired, a cough will continue to aggravate the problem.   Do not wear anything tight over your hernia. Do not try to keep it in with an outside bandage or truss. These can damage abdominal contents if they are trapped within the hernia sac.   Eat a normal diet.   Avoid constipation. Straining over long periods of time will increase hernia size and encourage breakdown of repairs. If you cannot do this with diet alone, stool softeners may be used.  SEEK IMMEDIATE MEDICAL CARE IF:   You have a fever.   You develop increasing abdominal pain.   You feel nauseous or vomit.   Your hernia is stuck outside the  abdomen, looks discolored, feels hard, or is tender.   You have any changes in your bowel habits or in the hernia that are unusual for you.   You have increased pain or swelling around the hernia.   You cannot push the hernia back in place by applying gentle pressure while lying down.  MAKE SURE YOU:   Understand these instructions.   Will watch your condition.   Will get help right away if you are not doing well or get worse.  Document Released: 12/30/2004 Document Revised: 12/19/2010 Document Reviewed: 08/19/2007 [employer] Patient Information 2012 [employer]. 

## 2011-09-06 ENCOUNTER — Other Ambulatory Visit: Payer: Self-pay | Admitting: Physical Medicine & Rehabilitation

## 2011-09-22 ENCOUNTER — Ambulatory Visit: Payer: Federal, State, Local not specified - PPO | Admitting: Physical Medicine & Rehabilitation

## 2011-09-22 ENCOUNTER — Encounter: Payer: Federal, State, Local not specified - PPO | Attending: Physical Medicine & Rehabilitation

## 2011-09-22 DIAGNOSIS — I69959 Hemiplegia and hemiparesis following unspecified cerebrovascular disease affecting unspecified side: Secondary | ICD-10-CM | POA: Insufficient documentation

## 2011-09-22 DIAGNOSIS — M79609 Pain in unspecified limb: Secondary | ICD-10-CM | POA: Insufficient documentation

## 2011-09-22 DIAGNOSIS — M549 Dorsalgia, unspecified: Secondary | ICD-10-CM | POA: Insufficient documentation

## 2011-09-22 DIAGNOSIS — R209 Unspecified disturbances of skin sensation: Secondary | ICD-10-CM | POA: Insufficient documentation

## 2011-09-24 ENCOUNTER — Encounter (HOSPITAL_COMMUNITY): Payer: Self-pay | Admitting: Pharmacy Technician

## 2011-09-25 ENCOUNTER — Encounter (HOSPITAL_COMMUNITY): Payer: Self-pay

## 2011-09-25 ENCOUNTER — Encounter (HOSPITAL_COMMUNITY)
Admission: RE | Admit: 2011-09-25 | Discharge: 2011-09-25 | Disposition: A | Discharge status: Home / Self Care | Payer: Federal, State, Local not specified - PPO | Source: Ambulatory Visit | Attending: Surgery | Admitting: Surgery

## 2011-09-25 HISTORY — DX: Ventral hernia without obstruction or gangrene: K43.9

## 2011-09-25 HISTORY — DX: Headache: R51

## 2011-09-25 HISTORY — DX: Unspecified rotator cuff tear or rupture of right shoulder, not specified as traumatic: M75.101

## 2011-09-25 HISTORY — DX: Restless legs syndrome: G25.81

## 2011-09-25 HISTORY — DX: Rash and other nonspecific skin eruption: R21

## 2011-09-25 HISTORY — DX: Type 2 diabetes mellitus with diabetic neuropathy, unspecified: E11.40

## 2011-09-25 LAB — BASIC METABOLIC PANEL WITH GFR
BUN: 10 mg/dL (ref 6–23)
CO2: 26 meq/L (ref 19–32)
Calcium: 9.5 mg/dL (ref 8.4–10.5)
Chloride: 98 meq/L (ref 96–112)
Creatinine, Ser: 0.75 mg/dL (ref 0.50–1.10)
GFR calc Af Amer: >90 mL/min
GFR calc non Af Amer: >90 mL/min
Glucose, Bld: 126 mg/dL — ABNORMAL HIGH (ref 70–99)
Potassium: 3.6 meq/L (ref 3.5–5.1)
Sodium: 134 meq/L — ABNORMAL LOW (ref 135–145)

## 2011-09-25 LAB — SURGICAL PCR SCREEN
MRSA, PCR: NEGATIVE
Staphylococcus aureus: POSITIVE — AB

## 2011-09-25 LAB — CBC
HCT: 35.4 % — ABNORMAL LOW (ref 36.0–46.0)
Hemoglobin: 11.6 g/dL — ABNORMAL LOW (ref 12.0–15.0)
MCH: 25.4 pg — ABNORMAL LOW (ref 26.0–34.0)
MCHC: 32.8 g/dL (ref 30.0–36.0)
RBC: 4.56 MIL/uL (ref 3.87–5.11)

## 2011-09-25 MED ORDER — CHLORHEXIDINE GLUCONATE 4 % EX LIQD: 1.0000 "application " | Freq: Once | CUTANEOUS | Status: DC

## 2011-09-25 NOTE — Pre-Procedure Instructions (Addendum)
PREOP CBC, BMET, SERUM PREGNANCY TEST AND EKG WERE DONE TODAY AT [facility] AS PER ANESTHESIOLOGIST'S GUIDELINES.  PT HAS CXR 1 VIEW IN EPIC FROM 08/01/11 THAT WAS DONE WITH AN ACUTE ABDOMINAL SERIES.  PREOP INSTRUCTIONS DISCUSSED WITH PT USING TEACH BACK METHOD. OFFICE NOTE FROM [facility] DATED 09/01/11 ON PT'S CHART.

## 2011-09-25 NOTE — Patient Instructions (Addendum)
YOUR SURGERY IS SCHEDULED ON:  Friday  9/20  AT 1:00 PM  REPORT TO  SHORT STAY CENTER AT:  11:00 AM      PHONE # FOR SHORT STAY IS [phone]  DO NOT EAT ANYTHING AFTER MIDNIGHT THE NIGHT BEFORE YOUR SURGERY.   NO FOOD, NO CHEWING GUM, NO MINTS, NO CANDIES, NO CHEWING TOBACCO. YOU MAY HAVE CLEAR LIQUIDS TO DRINK FROM MIDNIGHT THE NIGHT BEFORE YOUR SURGERY --UNTIL 7:00 AM DAY OF SURGERY --LIKE WATER.  NOTHING TO DRINK AFTER 7:00 AM DAY OF SURGERY.  PLEASE TAKE THE FOLLOWING MEDICATIONS THE AM OF YOUR SURGERY WITH A FEW SIPS OF WATER:  GABAPENTIN,  TRAMADOL      IF YOU ARE DIABETIC:  DO NOT TAKE ANY DIABETIC MEDICATIONS THE AM OF YOUR SURGERY.  IF YOU TAKE INSULIN IN THE EVENINGS--PLEASE ONLY TAKE 1/2 NORMAL EVENING DOSE THE NIGHT BEFORE YOUR SURGERY.  NO INSULIN THE AM OF YOUR SURGERY.   DO NOT BRING VALUABLES, MONEY, CREDIT CARDS.  CONTACT LENS, DENTURES / PARTIALS, GLASSES SHOULD NOT BE WORN TO SURGERY AND IN MOST CASES-HEARING AIDS WILL NEED TO BE REMOVED.  BRING YOUR GLASSES CASE, ANY EQUIPMENT NEEDED FOR YOUR CONTACT LENS. FOR PATIENTS ADMITTED TO THE HOSPITAL--CHECK OUT TIME THE DAY OF DISCHARGE IS 11:00 AM.  ALL INPATIENT ROOMS ARE PRIVATE - WITH BATHROOM, TELEPHONE, TELEVISION AND WIFI INTERNET. IF YOU ARE BEING DISCHARGED THE SAME DAY OF YOUR SURGERY--YOU CAN NOT DRIVE YOURSELF HOME--AND SHOULD NOT GO HOME ALONE BY TAXI OR BUS.  NO DRIVING OR OPERATING MACHINERY FOR 24 HOURS FOLLOWING ANESTHESIA / PAIN MEDICATIONS.                            SPECIAL INSTRUCTIONS:  CHLORHEXIDINE SOAP SHOWER (other brand names are Betasept and Hibiclens ) PLEASE SHOWER WITH CHLORHEXIDINE THE NIGHT BEFORE YOUR SURGERY AND THE AM OF YOUR SURGERY. DO NOT USE CHLORHEXIDINE ON YOUR FACE OR PRIVATE AREAS--YOU MAY USE YOUR NORMAL SOAP THOSE AREAS AND YOUR NORMAL SHAMPOO.  WOMEN SHOULD AVOID SHAVING UNDER ARMS AND SHAVING LEGS 48 HOURS BEFORE USING CHLORHEXIDINE TO AVOID SKIN IRRITATION.  DO NOT  USE IF ALLERGIC TO CHLORHEXIDINE.  PLEASE READ OVER ANY  FACT SHEETS THAT YOU WERE GIVEN: MRSA INFORMATION

## 2011-10-02 MED ORDER — BUPIVACAINE 0.25 % ON-Q PUMP DUAL CATH 300 ML
300.0000 mL | INJECTION | Status: DC
  Filled 2011-10-02: qty 300

## 2011-10-03 ENCOUNTER — Encounter (HOSPITAL_COMMUNITY): Payer: Self-pay | Admitting: Anesthesiology

## 2011-10-03 ENCOUNTER — Ambulatory Visit (HOSPITAL_COMMUNITY): Payer: Federal, State, Local not specified - PPO | Admitting: Anesthesiology

## 2011-10-03 ENCOUNTER — Inpatient Hospital Stay (HOSPITAL_COMMUNITY)
Admission: RE | Admit: 2011-10-03 | Discharge: 2011-10-07 | DRG: 160 | Disposition: A | Discharge status: Home Health | Payer: Federal, State, Local not specified - PPO | Source: Ambulatory Visit | Attending: Surgery | Admitting: Surgery

## 2011-10-03 ENCOUNTER — Encounter (HOSPITAL_COMMUNITY): Payer: Self-pay | Admitting: *Deleted

## 2011-10-03 ENCOUNTER — Encounter (HOSPITAL_COMMUNITY)
Admission: RE | Disposition: A | Discharge status: Home Health | Payer: Self-pay | Source: Ambulatory Visit | Attending: Surgery

## 2011-10-03 DIAGNOSIS — F3289 Other specified depressive episodes: Secondary | ICD-10-CM | POA: Diagnosis present

## 2011-10-03 DIAGNOSIS — K432 Incisional hernia without obstruction or gangrene: Secondary | ICD-10-CM

## 2011-10-03 DIAGNOSIS — Z01812 Encounter for preprocedural laboratory examination: Secondary | ICD-10-CM

## 2011-10-03 DIAGNOSIS — R338 Other retention of urine: Secondary | ICD-10-CM | POA: Diagnosis not present

## 2011-10-03 DIAGNOSIS — G8111 Spastic hemiplegia affecting right dominant side: Secondary | ICD-10-CM | POA: Diagnosis present

## 2011-10-03 DIAGNOSIS — I69998 Other sequelae following unspecified cerebrovascular disease: Secondary | ICD-10-CM

## 2011-10-03 DIAGNOSIS — M545 Low back pain, unspecified: Secondary | ICD-10-CM | POA: Diagnosis present

## 2011-10-03 DIAGNOSIS — I10 Essential (primary) hypertension: Secondary | ICD-10-CM | POA: Diagnosis present

## 2011-10-03 DIAGNOSIS — F329 Major depressive disorder, single episode, unspecified: Secondary | ICD-10-CM | POA: Diagnosis present

## 2011-10-03 DIAGNOSIS — E669 Obesity, unspecified: Secondary | ICD-10-CM | POA: Diagnosis present

## 2011-10-03 DIAGNOSIS — E119 Type 2 diabetes mellitus without complications: Secondary | ICD-10-CM | POA: Diagnosis present

## 2011-10-03 DIAGNOSIS — R5381 Other malaise: Secondary | ICD-10-CM | POA: Diagnosis present

## 2011-10-03 DIAGNOSIS — G8110 Spastic hemiplegia affecting unspecified side: Secondary | ICD-10-CM | POA: Diagnosis present

## 2011-10-03 DIAGNOSIS — Z6838 Body mass index (BMI) 38.0-38.9, adult: Secondary | ICD-10-CM

## 2011-10-03 HISTORY — PX: VENTRAL HERNIA REPAIR: SHX424

## 2011-10-03 HISTORY — PX: HERNIA REPAIR: SHX51

## 2011-10-03 LAB — GLUCOSE, CAPILLARY
Glucose-Capillary: 151 mg/dL — ABNORMAL HIGH (ref 70–99)
Glucose-Capillary: 222 mg/dL — ABNORMAL HIGH (ref 70–99)

## 2011-10-03 SURGERY — REPAIR, HERNIA, VENTRAL, LAPAROSCOPIC
Anesthesia: General | Site: Abdomen | Wound class: Clean

## 2011-10-03 MED ORDER — BUPIVACAINE-EPINEPHRINE 0.25% -1:200000 IJ SOLN
INTRAMUSCULAR | Status: AC
  Filled 2011-10-03: qty 1

## 2011-10-03 MED ORDER — CEFAZOLIN SODIUM-DEXTROSE 2-3 GM-% IV SOLR
INTRAVENOUS | Status: AC
  Filled 2011-10-03: qty 50

## 2011-10-03 MED ORDER — INSULIN GLARGINE 100 UNIT/ML ~~LOC~~ SOLN
22.0000 [IU] | Freq: Every day | SUBCUTANEOUS | Status: DC
  Administered 2011-10-04 – 2011-10-07 (×4): 22 [IU] via SUBCUTANEOUS

## 2011-10-03 MED ORDER — LACTATED RINGERS IV SOLN
INTRAVENOUS | Status: DC
  Administered 2011-10-03 (×2): 1000 mL via INTRAVENOUS
  Administered 2011-10-03: 13:00:00 via INTRAVENOUS

## 2011-10-03 MED ORDER — PSYLLIUM 95 % PO PACK
1.0000 | PACK | Freq: Two times a day (BID) | ORAL | Status: DC
  Administered 2011-10-04 – 2011-10-06 (×6): 1 via ORAL
  Filled 2011-10-03 (×9): qty 1

## 2011-10-03 MED ORDER — PSYLLIUM 95 % PO PACK
1.0000 | PACK | Freq: Two times a day (BID) | ORAL | Status: DC
  Filled 2011-10-03: qty 1

## 2011-10-03 MED ORDER — SODIUM CHLORIDE 0.9 % IJ SOLN: 3.0000 mL | Freq: Two times a day (BID) | INTRAMUSCULAR | Status: DC

## 2011-10-03 MED ORDER — MAGIC MOUTHWASH
15.0000 mL | Freq: Four times a day (QID) | ORAL | Status: DC | PRN
  Filled 2011-10-03: qty 15

## 2011-10-03 MED ORDER — PROMETHAZINE HCL 25 MG/ML IJ SOLN: 6.2500 mg | INTRAMUSCULAR | Status: DC | PRN

## 2011-10-03 MED ORDER — NAPROXEN 500 MG PO TABS
500.0000 mg | ORAL_TABLET | Freq: Two times a day (BID) | ORAL | Status: DC
  Filled 2011-10-03: qty 1

## 2011-10-03 MED ORDER — ALUM & MAG HYDROXIDE-SIMETH 200-200-20 MG/5ML PO SUSP: 30.0000 mL | Freq: Four times a day (QID) | ORAL | Status: DC | PRN

## 2011-10-03 MED ORDER — ADULT MULTIVITAMIN W/MINERALS CH
1.0000 | ORAL_TABLET | Freq: Every day | ORAL | Status: DC
  Administered 2011-10-04 – 2011-10-07 (×4): 1 via ORAL
  Filled 2011-10-03 (×5): qty 1

## 2011-10-03 MED ORDER — ALBUTEROL SULFATE (5 MG/ML) 0.5% IN NEBU
2.5000 mg | INHALATION_SOLUTION | Freq: Once | RESPIRATORY_TRACT | Status: AC
  Administered 2011-10-03: 2.5 mg via RESPIRATORY_TRACT

## 2011-10-03 MED ORDER — HYDROMORPHONE HCL PF 1 MG/ML IJ SOLN
INTRAMUSCULAR | Status: AC
  Filled 2011-10-03: qty 1

## 2011-10-03 MED ORDER — AMLODIPINE BESYLATE 10 MG PO TABS
10.0000 mg | ORAL_TABLET | Freq: Every day | ORAL | Status: DC
  Administered 2011-10-03 – 2011-10-07 (×4): 10 mg via ORAL
  Filled 2011-10-03 (×5): qty 1

## 2011-10-03 MED ORDER — BUPIVACAINE 0.25 % ON-Q PUMP DUAL CATH 300 ML
300.0000 mL | INJECTION | Status: DC
  Filled 2011-10-03: qty 300

## 2011-10-03 MED ORDER — ONDANSETRON HCL 4 MG/2ML IJ SOLN
4.0000 mg | Freq: Four times a day (QID) | INTRAMUSCULAR | Status: DC | PRN
  Administered 2011-10-03: 4 mg via INTRAVENOUS
  Filled 2011-10-03 (×2): qty 2

## 2011-10-03 MED ORDER — SODIUM CHLORIDE 0.9 % IJ SOLN: 3.0000 mL | INTRAMUSCULAR | Status: DC | PRN

## 2011-10-03 MED ORDER — DIPHENHYDRAMINE HCL 50 MG/ML IJ SOLN: 12.5000 mg | Freq: Four times a day (QID) | INTRAMUSCULAR | Status: DC | PRN

## 2011-10-03 MED ORDER — INSULIN GLARGINE 100 UNIT/ML ~~LOC~~ SOLN
20.0000 [IU] | Freq: Every day | SUBCUTANEOUS | Status: DC
  Administered 2011-10-03: 20 [IU] via SUBCUTANEOUS

## 2011-10-03 MED ORDER — LACTATED RINGERS IV BOLUS (SEPSIS): 1000.0000 mL | Freq: Three times a day (TID) | INTRAVENOUS | Status: AC | PRN

## 2011-10-03 MED ORDER — HYDROMORPHONE HCL PF 1 MG/ML IJ SOLN
INTRAMUSCULAR | Status: DC | PRN
  Administered 2011-10-03: 1 mg via INTRAVENOUS

## 2011-10-03 MED ORDER — HYDROCHLOROTHIAZIDE 25 MG PO TABS
25.0000 mg | ORAL_TABLET | Freq: Every day | ORAL | Status: DC
  Administered 2011-10-03 – 2011-10-07 (×4): 25 mg via ORAL
  Filled 2011-10-03 (×5): qty 1

## 2011-10-03 MED ORDER — OXYCODONE HCL 5 MG PO TABS
5.0000 mg | ORAL_TABLET | ORAL | Status: DC | PRN
  Administered 2011-10-03 – 2011-10-04 (×3): 10 mg via ORAL
  Administered 2011-10-05: 5 mg via ORAL
  Administered 2011-10-05 (×2): 10 mg via ORAL
  Administered 2011-10-06: 5 mg via ORAL
  Filled 2011-10-03 (×5): qty 2
  Filled 2011-10-03 (×2): qty 1
  Filled 2011-10-03: qty 2

## 2011-10-03 MED ORDER — ONDANSETRON HCL 4 MG/2ML IJ SOLN
INTRAMUSCULAR | Status: DC | PRN
  Administered 2011-10-03: 4 mg via INTRAVENOUS

## 2011-10-03 MED ORDER — GLYCOPYRROLATE 0.2 MG/ML IJ SOLN
INTRAMUSCULAR | Status: DC | PRN
  Administered 2011-10-03: 0.6 mg via INTRAVENOUS

## 2011-10-03 MED ORDER — MAGNESIUM HYDROXIDE 400 MG/5ML PO SUSP: 30.0000 mL | Freq: Two times a day (BID) | ORAL | Status: DC | PRN

## 2011-10-03 MED ORDER — TRAMADOL HCL 50 MG PO TABS
50.0000 mg | ORAL_TABLET | Freq: Two times a day (BID) | ORAL | Status: DC
  Administered 2011-10-04 – 2011-10-06 (×5): 50 mg via ORAL
  Filled 2011-10-03 (×10): qty 1

## 2011-10-03 MED ORDER — ACETAMINOPHEN 10 MG/ML IV SOLN
INTRAVENOUS | Status: DC | PRN
  Administered 2011-10-03: 1000 mg via INTRAVENOUS

## 2011-10-03 MED ORDER — PROMETHAZINE HCL 25 MG/ML IJ SOLN
12.5000 mg | Freq: Four times a day (QID) | INTRAMUSCULAR | Status: DC | PRN
  Administered 2011-10-04: 25 mg via INTRAVENOUS
  Administered 2011-10-04: 12.5 mg via INTRAVENOUS
  Filled 2011-10-03 (×2): qty 1

## 2011-10-03 MED ORDER — PROPOFOL 10 MG/ML IV BOLUS
INTRAVENOUS | Status: DC | PRN
  Administered 2011-10-03: 220 mg via INTRAVENOUS

## 2011-10-03 MED ORDER — KETOROLAC TROMETHAMINE 30 MG/ML IJ SOLN
INTRAMUSCULAR | Status: DC | PRN
  Administered 2011-10-03: 30 mg via INTRAVENOUS

## 2011-10-03 MED ORDER — DIPHENHYDRAMINE HCL 50 MG/ML IJ SOLN
12.5000 mg | Freq: Four times a day (QID) | INTRAMUSCULAR | Status: DC | PRN
  Administered 2011-10-03: 25 mg via INTRAVENOUS
  Administered 2011-10-06: 12.5 mg via INTRAVENOUS
  Administered 2011-10-06: 25 mg via INTRAVENOUS
  Filled 2011-10-03 (×3): qty 1

## 2011-10-03 MED ORDER — OXYCODONE HCL 5 MG PO TABS
5.0000 mg | ORAL_TABLET | ORAL | Status: DC | PRN
Start: 2011-10-03 — End: 2011-10-03

## 2011-10-03 MED ORDER — ASPIRIN 325 MG PO TABS
325.0000 mg | ORAL_TABLET | Freq: Every day | ORAL | Status: DC
  Administered 2011-10-03 – 2011-10-07 (×5): 325 mg via ORAL
  Filled 2011-10-03 (×5): qty 1

## 2011-10-03 MED ORDER — LIDOCAINE HCL (CARDIAC) 20 MG/ML IV SOLN
INTRAVENOUS | Status: DC | PRN
  Administered 2011-10-03: 50 mg via INTRAVENOUS

## 2011-10-03 MED ORDER — AMLODIPINE-VALSARTAN-HCTZ 10-320-25 MG PO TABS
1.0000 | ORAL_TABLET | Freq: Every evening | ORAL | Status: DC
Start: 2011-10-03 — End: 2011-10-03

## 2011-10-03 MED ORDER — OXYCODONE HCL 5 MG PO TABS: 5.0000 mg | ORAL_TABLET | ORAL | Status: DC | PRN

## 2011-10-03 MED ORDER — FENTANYL CITRATE 0.05 MG/ML IJ SOLN
25.0000 ug | INTRAMUSCULAR | Status: DC | PRN
  Administered 2011-10-03 – 2011-10-04 (×2): 50 ug via INTRAVENOUS
  Filled 2011-10-03 (×2): qty 2

## 2011-10-03 MED ORDER — BUPIVACAINE 0.25 % ON-Q PUMP DUAL CATH 300 ML
INJECTION | Status: DC | PRN
  Administered 2011-10-03: 300 mL

## 2011-10-03 MED ORDER — ACETAMINOPHEN 10 MG/ML IV SOLN
INTRAVENOUS | Status: AC
  Filled 2011-10-03: qty 100

## 2011-10-03 MED ORDER — MEPERIDINE HCL 50 MG/ML IJ SOLN: 6.2500 mg | INTRAMUSCULAR | Status: DC | PRN

## 2011-10-03 MED ORDER — FENTANYL CITRATE 0.05 MG/ML IJ SOLN
INTRAMUSCULAR | Status: DC | PRN
  Administered 2011-10-03 (×2): 100 ug via INTRAVENOUS
  Administered 2011-10-03: 50 ug via INTRAVENOUS

## 2011-10-03 MED ORDER — SODIUM CHLORIDE 0.9 % IV SOLN: 250.0000 mL | INTRAVENOUS | Status: DC | PRN

## 2011-10-03 MED ORDER — ROCURONIUM BROMIDE 100 MG/10ML IV SOLN
INTRAVENOUS | Status: DC | PRN
  Administered 2011-10-03: 5 mg via INTRAVENOUS
  Administered 2011-10-03: 10 mg via INTRAVENOUS
  Administered 2011-10-03: 5 mg via INTRAVENOUS
  Administered 2011-10-03: 10 mg via INTRAVENOUS
  Administered 2011-10-03: 25 mg via INTRAVENOUS
  Administered 2011-10-03: 10 mg via INTRAVENOUS

## 2011-10-03 MED ORDER — METOPROLOL TARTRATE 12.5 MG HALF TABLET
12.5000 mg | ORAL_TABLET | Freq: Two times a day (BID) | ORAL | Status: DC | PRN
  Filled 2011-10-03: qty 1

## 2011-10-03 MED ORDER — PROPOFOL INFUSION 10 MG/ML OPTIME
INTRAVENOUS | Status: DC | PRN
  Administered 2011-10-03: 160 ug/kg/min via INTRAVENOUS

## 2011-10-03 MED ORDER — LACTATED RINGERS IR SOLN
Status: DC | PRN
  Administered 2011-10-03: 1000 mL/h

## 2011-10-03 MED ORDER — ONDANSETRON HCL 4 MG/2ML IJ SOLN: 4.0000 mg | Freq: Four times a day (QID) | INTRAMUSCULAR | Status: DC | PRN

## 2011-10-03 MED ORDER — SUCCINYLCHOLINE CHLORIDE 20 MG/ML IJ SOLN
INTRAMUSCULAR | Status: DC | PRN
  Administered 2011-10-03: 100 mg via INTRAVENOUS

## 2011-10-03 MED ORDER — FENTANYL CITRATE 0.05 MG/ML IJ SOLN: 25.0000 ug | INTRAMUSCULAR | Status: DC | PRN

## 2011-10-03 MED ORDER — GABAPENTIN 300 MG PO CAPS
300.0000 mg | ORAL_CAPSULE | Freq: Three times a day (TID) | ORAL | Status: DC
  Administered 2011-10-03 – 2011-10-05 (×7): 300 mg via ORAL
  Filled 2011-10-03 (×11): qty 1

## 2011-10-03 MED ORDER — NEOSTIGMINE METHYLSULFATE 1 MG/ML IJ SOLN
INTRAMUSCULAR | Status: DC | PRN
  Administered 2011-10-03: 4 mg via INTRAVENOUS

## 2011-10-03 MED ORDER — ACETAMINOPHEN 325 MG PO TABS: 650.0000 mg | ORAL_TABLET | ORAL | Status: DC | PRN

## 2011-10-03 MED ORDER — ACETAMINOPHEN 650 MG RE SUPP: 650.0000 mg | RECTAL | Status: DC | PRN

## 2011-10-03 MED ORDER — LACTATED RINGERS IV SOLN: INTRAVENOUS | Status: DC

## 2011-10-03 MED ORDER — LIP MEDEX EX OINT: 1.0000 "application " | TOPICAL_OINTMENT | Freq: Two times a day (BID) | CUTANEOUS | Status: DC

## 2011-10-03 MED ORDER — SUFENTANIL CITRATE 50 MCG/ML IV SOLN
INTRAVENOUS | Status: DC | PRN
  Administered 2011-10-03: 5 ug via INTRAVENOUS
  Administered 2011-10-03: 10 ug via INTRAVENOUS
  Administered 2011-10-03: 15 ug via INTRAVENOUS
  Administered 2011-10-03: 5 ug via INTRAVENOUS
  Administered 2011-10-03: 10 ug via INTRAVENOUS
  Administered 2011-10-03: 5 ug via INTRAVENOUS
  Administered 2011-10-03 (×4): 10 ug via INTRAVENOUS

## 2011-10-03 MED ORDER — STERILE WATER FOR IRRIGATION IR SOLN
Status: DC | PRN
  Administered 2011-10-03: 10 mL via INTRAVESICAL

## 2011-10-03 MED ORDER — IRBESARTAN 300 MG PO TABS
300.0000 mg | ORAL_TABLET | Freq: Every day | ORAL | Status: DC
  Administered 2011-10-03 – 2011-10-07 (×4): 300 mg via ORAL
  Filled 2011-10-03 (×5): qty 1

## 2011-10-03 MED ORDER — BUPIVACAINE-EPINEPHRINE 0.25% -1:200000 IJ SOLN
INTRAMUSCULAR | Status: DC | PRN
  Administered 2011-10-03: 50 mL

## 2011-10-03 MED ORDER — HYDROMORPHONE HCL PF 1 MG/ML IJ SOLN
0.2500 mg | INTRAMUSCULAR | Status: DC | PRN
  Administered 2011-10-03 (×2): 0.5 mg via INTRAVENOUS

## 2011-10-03 MED ORDER — BISACODYL 10 MG RE SUPP: 10.0000 mg | Freq: Two times a day (BID) | RECTAL | Status: DC | PRN

## 2011-10-03 MED ORDER — METHYLENE BLUE 1 % INJ SOLN
INTRAMUSCULAR | Status: AC
  Filled 2011-10-03: qty 10

## 2011-10-03 MED ORDER — TIZANIDINE HCL 4 MG PO TABS
4.0000 mg | ORAL_TABLET | Freq: Every day | ORAL | Status: DC
  Administered 2011-10-03 – 2011-10-06 (×4): 4 mg via ORAL
  Filled 2011-10-03 (×6): qty 1

## 2011-10-03 MED ORDER — PROMETHAZINE HCL 25 MG/ML IJ SOLN: 12.5000 mg | Freq: Four times a day (QID) | INTRAMUSCULAR | Status: DC | PRN

## 2011-10-03 MED ORDER — LIP MEDEX EX OINT
1.0000 "application " | TOPICAL_OINTMENT | Freq: Two times a day (BID) | CUTANEOUS | Status: DC
  Administered 2011-10-04 – 2011-10-07 (×7): 1 via TOPICAL

## 2011-10-03 MED ORDER — DEXTROSE 5 % IV SOLN
3.0000 g | INTRAVENOUS | Status: AC
  Administered 2011-10-03: 2 g via INTRAVENOUS
  Filled 2011-10-03: qty 3000

## 2011-10-03 MED ORDER — LABETALOL HCL 5 MG/ML IV SOLN
INTRAVENOUS | Status: DC | PRN
  Administered 2011-10-03 (×3): 5 mg via INTRAVENOUS

## 2011-10-03 MED ORDER — BISACODYL 10 MG RE SUPP
10.0000 mg | Freq: Two times a day (BID) | RECTAL | Status: DC | PRN
Start: 2011-10-03 — End: 2011-10-03

## 2011-10-03 MED ORDER — NAPROXEN 500 MG PO TABS
500.0000 mg | ORAL_TABLET | Freq: Two times a day (BID) | ORAL | Status: DC
  Administered 2011-10-04 – 2011-10-07 (×7): 500 mg via ORAL
  Filled 2011-10-03 (×11): qty 1

## 2011-10-03 MED ORDER — LACTATED RINGERS IV BOLUS (SEPSIS): 1000.0000 mL | Freq: Three times a day (TID) | INTRAVENOUS | Status: DC | PRN

## 2011-10-03 MED ORDER — ALBUTEROL SULFATE (5 MG/ML) 0.5% IN NEBU
INHALATION_SOLUTION | RESPIRATORY_TRACT | Status: AC
  Filled 2011-10-03: qty 0.5

## 2011-10-03 SURGICAL SUPPLY — 47 items
APPLIER CLIP 5 13 M/L LIGAMAX5 (MISCELLANEOUS)
APR CLP MED LRG 5 ANG JAW (MISCELLANEOUS)
BINDER ABD UNIV 12 45-62 (WOUND CARE) IMPLANT
BINDER ABDOMINAL 46IN 62IN (WOUND CARE) ×2
CANISTER SUCTION 2500CC (MISCELLANEOUS) ×2 IMPLANT
CLIP APPLIE 5 13 M/L LIGAMAX5 (MISCELLANEOUS) IMPLANT
CLOTH BEACON ORANGE TIMEOUT ST (SAFETY) ×2 IMPLANT
CLSR STERI-STRIP ANTIMIC 1/2X4 (GAUZE/BANDAGES/DRESSINGS) ×2 IMPLANT
DECANTER SPIKE VIAL GLASS SM (MISCELLANEOUS) ×2 IMPLANT
DEVICE SECURE STRAP 25 ABSORB (INSTRUMENTS) ×1 IMPLANT
DEVICE TROCAR PUNCTURE CLOSURE (ENDOMECHANICALS) ×1 IMPLANT
DRAPE LAPAROSCOPIC ABDOMINAL (DRAPES) ×2 IMPLANT
DRSG TEGADERM 2-3/8X2-3/4 SM (GAUZE/BANDAGES/DRESSINGS) ×6 IMPLANT
ELECT REM PT RETURN 9FT ADLT (ELECTROSURGICAL) ×2
ELECTRODE REM PT RTRN 9FT ADLT (ELECTROSURGICAL) ×1 IMPLANT
FILTER SMOKE EVAC LAPAROSHD (FILTER) IMPLANT
GAUZE SPONGE 2X2 8PLY STRL LF (GAUZE/BANDAGES/DRESSINGS) IMPLANT
GLOVE ECLIPSE 8.0 STRL XLNG CF (GLOVE) ×2 IMPLANT
GLOVE INDICATOR 8.0 STRL GRN (GLOVE) ×4 IMPLANT
GOWN STRL NON-REIN LRG LVL3 (GOWN DISPOSABLE) ×4 IMPLANT
GOWN STRL REIN XL XLG (GOWN DISPOSABLE) ×4 IMPLANT
HAND ACTIVATED (MISCELLANEOUS) IMPLANT
KIT BASIN OR (CUSTOM PROCEDURE TRAY) ×2 IMPLANT
MESH PHYSIO OVAL 25X35CM (Mesh General) ×1 IMPLANT
NDL SPNL 22GX3.5 QUINCKE BK (NEEDLE) IMPLANT
NEEDLE SPNL 22GX3.5 QUINCKE BK (NEEDLE) ×2 IMPLANT
NS IRRIG 1000ML POUR BTL (IV SOLUTION) ×2 IMPLANT
PEN SKIN MARKING BROAD (MISCELLANEOUS) ×2 IMPLANT
PENCIL BUTTON HOLSTER BLD 10FT (ELECTRODE) IMPLANT
PUMP PAIN ON-Q (MISCELLANEOUS) ×2 IMPLANT
SCISSORS LAP 5X35 DISP (ENDOMECHANICALS) ×1 IMPLANT
SET IRRIG TUBING LAPAROSCOPIC (IRRIGATION / IRRIGATOR) IMPLANT
SLEEVE Z-THREAD 5X100MM (TROCAR) ×6 IMPLANT
SPONGE GAUZE 2X2 STER 10/PKG (GAUZE/BANDAGES/DRESSINGS) ×2
STRIP CLOSURE SKIN 1/2X4 (GAUZE/BANDAGES/DRESSINGS) ×4 IMPLANT
SUT MNCRL AB 4-0 PS2 18 (SUTURE) ×2 IMPLANT
SUT PROLENE 1 CT 1 30 (SUTURE) ×10 IMPLANT
SUT VIC AB 2-0 UR6 27 (SUTURE) IMPLANT
TACKER 5MM HERNIA 3.5CML NAB (ENDOMECHANICALS) ×1 IMPLANT
TOWEL OR 17X26 10 PK STRL BLUE (TOWEL DISPOSABLE) ×2 IMPLANT
TRAY FOLEY CATH 14FRSI W/METER (CATHETERS) IMPLANT
TRAY LAP CHOLE (CUSTOM PROCEDURE TRAY) ×2 IMPLANT
TROCAR XCEL BLADELESS 5X75MML (TROCAR) ×2 IMPLANT
TROCAR Z-THREAD FIOS 11X100 BL (TROCAR) ×2 IMPLANT
TROCAR Z-THREAD FIOS 5X100MM (TROCAR) ×2 IMPLANT
TUBING INSUFFLATION 10FT LAP (TUBING) ×2 IMPLANT
TUNNELER SHEATH ON-Q 16GX12 DP (PAIN MANAGEMENT) ×2 IMPLANT

## 2011-10-03 NOTE — Anesthesia Postprocedure Evaluation (Signed)
  Anesthesia Post-op Note  Patient: Quinlynn M Dardis  Procedure(s) Performed: Procedure(s) (LRB): LAPAROSCOPIC VENTRAL HERNIA (N/A) INSERTION OF MESH (N/A)  Patient Location: PACU  Anesthesia Type: General  Level of Consciousness: awake and alert   Airway and Oxygen Therapy: Patient Spontanous Breathing  Post-op Pain: mild  Post-op Assessment: Post-op Vital signs reviewed, Patient's Cardiovascular Status Stable, Respiratory Function Stable, Patent Airway and No signs of Nausea or vomiting  Post-op Vital Signs: stable  Complications: No apparent anesthesia complications

## 2011-10-03 NOTE — Op Note (Signed)
10/03/2011  4:04 PM  PATIENT:  Tina [name]  41 y.o. female  Patient Care Team: Michiel Sites, MD as PCP - General Sheronette Cathie Beams, MD as Consulting Physician (Obstetrics and Gynecology)  PRE-OPERATIVE DIAGNOSIS: Ventral Wall Hernia   POST-OPERATIVE DIAGNOSIS: Ventral Wall Hernia, incisional   PROCEDURE: Procedure(s):  LAPAROSCOPIC VENTRAL HERNIA  INSERTION OF MESH  Takedown & re-tacking of bladder   SURGEON:  Surgeon(s): Ardeth Sportsman, MD  ASSISTANT: none   ANESTHESIA:   local and general  EBL:  Total I/O In: 2000 [I.V.:2000] Out: 300 [Urine:250; Blood:50]  Delay start of Pharmacological VTE agent (>24hrs) due to surgical blood loss or risk of bleeding:  yes  DRAINS: none   SPECIMEN:  No Specimen  DISPOSITION OF SPECIMEN:  N/A  COUNTS:  YES  PLAN OF CARE: Admit for overnight observation  PATIENT DISPOSITION:  PACU - hemodynamically stable.   INDICATION: Pleasant morbidly obese female. Required c/s & uterine fibroid surgery. Has developed and incisional hernia. Has had discomfort.   Recommendation was made for surgical repair:  The anatomy & physiology of the abdominal wall was discussed. The pathophysiology of hernias was discussed. Natural history risks without surgery including progeressive enlargement, pain, incarceration & strangulation was discussed. Contributors to complications such as smoking, obesity, diabetes, prior surgery, etc were discussed.  I feel the risks of no intervention will lead to serious problems that outweigh the operative risks; therefore, I recommended surgery to reduce and repair the hernia. I explained laparoscopic techniques with possible need for an open approach. I noted the probable use of mesh to patch and/or buttress the hernia repair   Risks such as bleeding, infection, abscess, need for further treatment, heart attack, death, and other risks were discussed. I noted a good likelihood this will help address the  problem. Goals of post-operative recovery were discussed as well. Possibility that this will not correct all symptoms was explained. I stressed the importance of low-impact activity, aggressive pain control, avoiding constipation, & not pushing through pain to minimize risk of post-operative chronic pain or injury. Possibility of reherniation especially with smoking, obesity, diabetes, immunosuppression, and other health conditions was discussed. We will work to minimize complications.   An educational handout further explaining the pathology & treatment options was given as well. Questions were answered. The patient expresses understanding & wishes to proceed with surgery.   OR FINDINGS: She had a Swiss cheese region of hernias in her lower abdomen at the old Pfannenstiel incision. 16 x 11 cm in total size.   Large fibroid uterus.  She has a 35 x 25 cm physiomesh laying transversely over the infraumbilical abdomen.   DESCRIPTION:  Informed consent was confirmed. The patient underwent general anaesthesia without difficulty. The patient was positioned appropriately. VTE prevention in place. The patient's abdomen was clipped, prepped, & draped in a sterile fashion. Surgical timeout confirmed our plan.   The patient was positioned in reverse Trendelenburg. Abdominal entry was gained using optical entry technique in the left upper abdomen. Entry was clean. I induced carbon dioxide insufflation. Camera inspection revealed no injury. Extra ports were carefully placed under direct laparoscopic visualization.   I could see the Swiss cheese hernias in the lower abdomen. I mapped out the region. I scored the peritoneum in the lower abdomen from the left to right hip. I went into the preperitoneal space. I transected bilateral round ligaments to mobilize the uterus. I freed peritoneum off the lower anterior abdominal wall.  The plane was poor  in the midline, so I took some rectus mucle to stay away from the  bladder.  The plane was much cleaner down to the deep pelvis anteriorly.  We placed methyline blue IV & saw green/blue urine in the bag but no leaking in the bladder or intraperitoneally.  I chose a 25 x 35 cm dual sided mesh (physiomesh). I laid it in transversely and the defect was primarily transverse. I secured the mesh down in the deep pelvis. I used a spiral Protacker x5 along the pubic rim. The mesh laid well overlapped in both folds over laterally. I secured the superior half of the mesh to the anterior abdominal wall using #1 Prolene interrupted stitches x10, highest at the supraumbilical fascia midline. I then proceeded to place #1 Prolenes in and out the central part of the mesh, 3 along the suprapubic fascia. Five along the upper edge of the hernia about 2 cm away on good fascia. This allowed the mesh to lay well.   I then brought up the peritoneum and tacked it back up over the mesh. I used absorbable Secure Strap tacks. This covered the lower half of the mesh. Use that to help recheck off the ladder and uterus. Hemostasis was excellent. I tacke the edges & central part of the mesh as well. This provided at least 5-10 cm circumferential coverage around the entire region of hernia defects.  I closed the fascial port site on the 10 mm port in the left flank using 0 Vicryl stitch using laparoscopic intracorporeal suture passer.   I then placed On-Q catheter she's in the preperitoneal plane under direct laparoscopic guidance from the subxiphoid region down to the posterior lateral flanks. I did reinspection. Hemostasis is good. Mesh laid well. Capnoperitoneum was evacuated. Ports were removed. The skin was closed with Monocryl at the port sites and Steri-Strips on the fascial stitch puncture sites. OnQ catheters were placed and the sheath peeled away. On-Q pump was secured. Patient is extubated and in the recovery room. Given that this was a larger hernia than expected, I think she needs to at least  overnight. I'm about to discuss operative findings with the patient's family.

## 2011-10-03 NOTE — Anesthesia Preprocedure Evaluation (Signed)
Anesthesia Evaluation  Patient identified by MRN, date of birth, ID band Patient awake    Reviewed: Allergy & Precautions, H&P , NPO status , Patient's Chart, lab work & pertinent test results  Airway Mallampati: II TM Distance: >3 FB Neck ROM: Full    Dental No notable dental hx.    Pulmonary neg pulmonary ROS,  breath sounds clear to auscultation  Pulmonary exam normal       Cardiovascular hypertension, Pt. on medications negative cardio ROS  Rhythm:Regular Rate:Normal     Neuro/Psych PSYCHIATRIC DISORDERS Anxiety Depression CVA (r sided weakness), Residual Symptoms negative neurological ROS  negative psych ROS   GI/Hepatic negative GI ROS, Neg liver ROS,   Endo/Other  negative endocrine ROSdiabetes, Insulin DependentMorbid obesity  Renal/GU negative Renal ROS  negative genitourinary   Musculoskeletal negative musculoskeletal ROS (+)   Abdominal   Peds negative pediatric ROS (+)  Hematology negative hematology ROS (+)   Anesthesia Other Findings   Reproductive/Obstetrics negative OB ROS                           Anesthesia Physical Anesthesia Plan  ASA: III  Anesthesia Plan: General   Post-op Pain Management:    Induction: Intravenous  Airway Management Planned: Oral ETT  Additional Equipment:   Intra-op Plan:   Post-operative Plan: Extubation in OR  Informed Consent: I have reviewed the patients History and Physical, chart, labs and discussed the procedure including the risks, benefits and alternatives for the proposed anesthesia with the patient or authorized representative who has indicated his/her understanding and acceptance.   Dental advisory given  Plan Discussed with: CRNA  Anesthesia Plan Comments:         Anesthesia Quick Evaluation

## 2011-10-03 NOTE — Progress Notes (Signed)
Pt ambulated around bed and sat in chair for 1 1/2 hrs. Pt did c/o nausea. Medication given, monitoring will continue.

## 2011-10-03 NOTE — H&P (Signed)
Tina [name]  Jan 31, 1970 [id number]  CARE TEAM:  PCP: Michiel Sites, MD  Outpatient Care Team: Patient Care Team: Michiel Sites, MD as PCP - General Serita Kyle, MD as Consulting Physician (Obstetrics and Gynecology)  Inpatient Treatment Team: Treatment Team: Attending Provider: Ardeth Sportsman, MD   This patient is a 41 y.o.female who presents today for surgical evaluation.   Reason for evaluation: Followup on incisional hernia and right lower quadrant.   HPI: Obese female 17 months from emergent cesarean section done in [place]. Noticed some firmness on the right side of her incision. Then became more obvious lump over a year ago. Usually soft reducible.  Began to have lower abdominal pain. Constipated for seven days. Saw a gynecologist in town (Dr. Ernestina Penna). CT scan was ordered. Narcotics were written but she claims she never used them. CT scan showed a loop of small bowel in a right lower corner incisional hernia. No evidence of incarceration or obstruction. Contrast flowed towards the hepatic flexure of the colon. She began to get worsening nausea and vomiting last night. Came to [facility] emergency room. Based on the CT scan findings Dr. Cherly Hensen wished emergent surgical evaluation. Patient was found to have a left ovarian cyst. Dr. Cherly Hensen recommended ultrasound followup. She did not think it was the source of her problems.  I saw her in ED & felt not an emergent issue.  Saw last month in office.  Sensitive in her right lower quadrant at times. Constipation under better control with increasing the MiraLAX. Still using occasional narcotics for neuralgias and discomfort. Eating well. More active. No new events   Patient Active Problem List  Diagnosis  . Right spastic hemiparesis  . Lumbago  . Paresthesias  . Sciatica neuralgia, right  . Paresthesias in right hand  . Rotator cuff tear  . Hernia, incisional, RLQ  . Obesity (BMI 30-39.9)    Past Medical  History  Diagnosis Date  . Flaccid hemiplegia affecting dominant side   . Enthesopathy of hip region     RIGHT HIP PAIN WITH PAIN DOWN RIGHT LEG  . Lumbago     LOWER BACK  . Spastic hemiplegia affecting dominant side   . Panic disorder without agoraphobia   . Depression   . Anxiety   . Diabetes mellitus   . Hypertension   . Blood transfusion     IN 2012  AFTER C -SECTION  . Cerebral thrombosis with cerebral infarction JUNE 2011    RIGHT SIDED WEAKNESS ( ARM AND LEG ) AND SPASMS  . Stroke   . Right rotator cuff tear     PAIN IN RIGHT SHOULDER  . Ventral hernia     RIGHT LOWER QUADRANT-CAUSING SOME PAIN  . Cough     STARTED 09/24/11--NO OTHER SYMPTOMS.  Marland Kitchen Anemia     DURING MENSES--HAS HEAVY BLEEDING WITH PERODS  . Headache     MIGRAINES--NOT REALLY HEADACHE-MORE LIKE PRESSURE SENSATION IN HEAD-FEELS DIZZIY AND  FAINT AS THE PRESSURE RESOLVES  . Diabetic neuropathy     BOTH FEET --COMES AND GOES  . Restless leg syndrome     DIAGNOSED BY SLEEP STUDY - PT TOLD SHE DID NOT HAVE SLEEP APNEA  . Rash     HANDS, ARMS --STATES HX OF RASH EVER SINCE CHILDBIRTH/PREGNANCY.  STATES THE RASH OFTEN OCCURS WHEN SHE IS REALLY STRESSED.    Past Surgical History  Procedure Date  . Cesarean section 2012  . Uterine fibroid surgery     2  SURGERIES FOR FIBROIDS  . Ureter revision     History   Social History  . Marital Status: Married    Spouse Name: N/A    Number of Children: N/A  . Years of Education: N/A   Occupational History  . Not on file.   Social History Main Topics  . Smoking status: Never Smoker   . Smokeless tobacco: Never Used  . Alcohol Use: No  . Drug Use: No  . Sexually Active: Yes    Birth Control/ Protection: None   Other Topics Concern  . Not on file   Social History Narrative  . No narrative on file    Family History  Problem Relation Age of Onset  . Diabetes Father   . Kidney disease Father   . Other Neg Hx   . Diabetes Maternal Grandmother   .  Hyperlipidemia Paternal Grandmother   . Stroke Paternal Grandmother     Current Facility-Administered Medications  Medication Dose Route Frequency Provider Last Rate Last Dose  . bupivacaine 0.25 % ON-Q pump DUAL CATH 300 mL  300 mL Other Continuous Ardeth Sportsman, MD      . ceFAZolin (ANCEF) 3 g in dextrose 5 % 50 mL IVPB  3 g Intravenous 60 min Pre-Op Ardeth Sportsman, MD      . DISCONTD: bupivacaine 0.25 % ON-Q pump DUAL CATH 300 mL  300 mL Other Continuous Ardeth Sportsman, MD         Allergies  Allergen Reactions  . Contrast Media (Iodinated Diagnostic Agents) Other (See Comments)    Difficulty breathing  . Iohexol Hives, Nausea And Vomiting and Swelling     Desc: Magnevist-gadolinium-difficulty breathing, throat swelling   . Midazolam Hcl Other (See Comments)    Difficulty breathing  . Shellfish Allergy Anaphylaxis  . Avandia (Rosiglitazone Maleate)     Patient doesn't remember reaction  . Kiwi Extract Itching and Swelling  . Metformin And Related Nausea And Vomiting and Other (See Comments)    diarrhea  . Other Itching    Patient is allergic to all nuts except peanuts.      BP 161/85  Pulse 94  Temp 99 F (37.2 C) (Oral)  Resp 16  SpO2 100%  LMP 09/13/2011  Review of Systems  Constitutional: Negative for fever, chills, weight loss and malaise/fatigue.  HENT: Negative for neck pain and ear discharge.  Eyes: Negative for double vision and photophobia.  Respiratory: Negative for cough and shortness of breath.  Cardiovascular: Negative for chest pain, palpitations, orthopnea and leg swelling.  Gastrointestinal: Positive for  abdominal pain and constipation. Negative for heartburn, diarrhea, blood in stool and melena.  No personal nor family history of GI/colon cancer, inflammatory bowel disease, irritable bowel syndrome, allergy such as Celiac Sprue, dietary/dairy problems, colitis, ulcers nor gastritis.  No recent sick contacts/gastroenteritis. No travel outside the  country. No changes in diet.  Genitourinary: Negative for dysuria and urgency.  Musculoskeletal: Positive for myalgias and back pain. Negative for falls.  Skin: Negative for itching and rash.  Neurological: Negative for dizziness, speech change and headaches.  Endo/Heme/Allergies: Negative for polydipsia. Does not bruise/bleed easily.  Psychiatric/Behavioral: Negative for suicidal ideas and substance abuse.  Blood pressure 160/86, pulse 94, temperature 98.3 F (36.8 C), temperature source Oral, resp. rate 16, height 5\' 6"  (1.676 m), weight 243 lb (110.224 kg), last menstrual period 07/30/2011.  Physical Exam  Constitutional: She is oriented to person, place, and time. She appears well-developed and well-nourished. Non-toxic appearance.  She does not have a sickly appearance. She appears ill. She appears distressed.  HENT:  Head: Normocephalic and atraumatic.  Nose: Nose normal.  Mouth/Throat: Oropharynx is clear and moist. No oropharyngeal exudate.  Eyes: Conjunctivae and EOM are normal. Pupils are equal, round, and reactive to light. Right eye exhibits no discharge. Left eye exhibits no discharge. No scleral icterus.  Neck: Normal range of motion. Neck supple. No thyromegaly present.  Cardiovascular: Regular rhythm and intact distal pulses.  Respiratory: Effort normal. No respiratory distress.  GI: Soft. Bowel sounds are normal. She exhibits mass. She exhibits no shifting dullness and no distension. There is tenderness in the suprapubic area. There is no rigidity, no rebound, no guarding, no CVA tenderness and negative Murphy's sign. A hernia is present. Hernia confirmed negative in the right inguinal area and confirmed negative in the left inguinal area.    Moderate panniculus.  Musculoskeletal: Normal range of motion. She exhibits no edema.  Lymphadenopathy:  Right: No inguinal adenopathy present.  Left: No inguinal adenopathy present.  Neurological: She is oriented to person, place, and  time. No cranial nerve deficit.  Skin: Skin is warm and dry.  Psychiatric: She has a normal mood and affect. Her behavior is normal.      Results:   Labs: No results found for this or any previous visit (from the past 48 hour(s)).  Imaging / Studies: No results found.  Medications / Allergies: per chart  Antibiotics: Anti-infectives     Start     Dose/Rate Route Frequency Ordered Stop   10/03/11 1110   ceFAZolin (ANCEF) 3 g in dextrose 5 % 50 mL IVPB        3 g 160 mL/hr over 30 Minutes Intravenous 60 min pre-op 10/03/11 1110            Assessment  Aleena Artelia Laroche  41 y.o. female  Day of Surgery  Procedure(s): LAPAROSCOPIC VENTRAL HERNIA INSERTION OF MESH  Problem List:  Active Problems:  * No active hospital problems. *   Assessment:   RLQ VWH incisional  Plan:   Lap repair with ONQ pump & at least overnight stay:  The anatomy & physiology of the abdominal wall was discussed. The pathophysiology of hernias was discussed. Natural history risks without surgery including progeressive enlargement, pain, incarceration & strangulation was discussed. Contributors to complications such as smoking, obesity, diabetes, prior surgery, etc were discussed.  I feel the risks of no intervention will lead to serious problems that outweigh the operative risks; therefore, I recommended surgery to reduce and repair the hernia. I explained laparoscopic techniques with possible need for an open approach. I noted the probable use of mesh to patch and/or buttress the hernia repair  Risks such as bleeding, infection, abscess, need for further treatment, heart attack, death, and other risks were discussed. I noted a good likelihood this will help address the problem. Goals of post-operative recovery were discussed as well. Possibility that this will not correct all symptoms was explained. I stressed the importance of low-impact activity, aggressive pain control, avoiding constipation, & not  pushing through pain to minimize risk of post-operative chronic pain or injury. Possibility of reherniation especially with smoking, obesity, diabetes, immunosuppression, and other health conditions was discussed. We will work to minimize complications.  An educational handout further explaining the pathology & treatment options was given as well. Questions were answered. The patient expresses understanding & wishes to proceed with surgery.   -VTE prophylaxis- SCDs, etc -mobilize as tolerated to  help recovery  Ardeth Sportsman, M.D., F.A.C.S. Gastrointestinal and Minimally Invasive Surgery [facility] [address] [place], [place] [address] [phone] Main / Paging [phone] Voice Mail   10/03/2011

## 2011-10-03 NOTE — Transfer of Care (Signed)
Immediate Anesthesia Transfer of Care Note  Patient: Tina [name]  Procedure(s) Performed: Procedure(s) (LRB) with comments: LAPAROSCOPIC VENTRAL HERNIA (N/A) INSERTION OF MESH (N/A)  Patient Location: PACU  Anesthesia Type: General  Level of Consciousness: awake, alert , oriented and patient cooperative  Airway & Oxygen Therapy: Patient Spontanous Breathing and Patient connected to face mask oxygen  Post-op Assessment: Report given to PACU RN, Post -op Vital signs reviewed and stable and Patient moving all extremities X 4  Post vital signs: stable  Complications: No apparent anesthesia complications

## 2011-10-04 LAB — GLUCOSE, CAPILLARY
Glucose-Capillary: 198 mg/dL — ABNORMAL HIGH (ref 70–99)
Glucose-Capillary: 280 mg/dL — ABNORMAL HIGH (ref 70–99)

## 2011-10-04 MED ORDER — INSULIN GLARGINE 100 UNIT/ML ~~LOC~~ SOLN
22.0000 [IU] | Freq: Every day | SUBCUTANEOUS | Status: DC
  Administered 2011-10-04 – 2011-10-06 (×3): 22 [IU] via SUBCUTANEOUS

## 2011-10-04 MED ORDER — INSULIN ASPART 100 UNIT/ML ~~LOC~~ SOLN
0.0000 [IU] | Freq: Once | SUBCUTANEOUS | Status: AC
  Administered 2011-10-04: 11 [IU] via SUBCUTANEOUS

## 2011-10-04 MED ORDER — INSULIN ASPART 100 UNIT/ML ~~LOC~~ SOLN
0.0000 [IU] | Freq: Three times a day (TID) | SUBCUTANEOUS | Status: DC
  Administered 2011-10-04 (×2): 8 [IU] via SUBCUTANEOUS
  Administered 2011-10-05: 3 [IU] via SUBCUTANEOUS
  Administered 2011-10-05: 5 [IU] via SUBCUTANEOUS
  Administered 2011-10-05: 8 [IU] via SUBCUTANEOUS
  Administered 2011-10-06: 3 [IU] via SUBCUTANEOUS
  Administered 2011-10-06: 2 [IU] via SUBCUTANEOUS

## 2011-10-04 NOTE — Progress Notes (Signed)
In and out cath 200 received for output. Pt urinated 100. Lurena Joiner, RN

## 2011-10-04 NOTE — Progress Notes (Signed)
Spoke with Dr Biagio Quint regarding patient CBG of 325 at hs.  Patient currently has Lantus scheduled at hs but no corrective insulin.  Discussed that patient had a late tray.  Order received to cover patient with same scale used for ac meals.  Verified order.  Discussed with Polo Riley Pharm D.  Orders placed.

## 2011-10-04 NOTE — Progress Notes (Signed)
Patient ID: Tina [name], female   DOB: 01-17-1970, 41 y.o.   MRN: [id number] 1 Day Post-Op  Subjective: In "a lot of pain". Has been up walking but very difficult to get out of bed. Has some nausea without vomiting.  Objective: Vital signs in last 24 hours: Temp:  [97.9 F (36.6 C)-99.3 F (37.4 C)] 99.3 F (37.4 C) (09/21 0608) Pulse Rate:  [84-96] 94  (09/21 0608) Resp:  [16-19] 16  (09/20 1745) BP: (108-161)/(62-89) 139/80 mmHg (09/21 0608) SpO2:  [95 %-100 %] 95 % (09/21 4098) Weight:  [242 lb (109.77 kg)] 242 lb (109.77 kg) (09/20 1759) Last BM Date: 10/02/11  Intake/Output from previous day: 09/20 0701 - 09/21 0700 In: 4080 [P.O.:1080; I.V.:3000] Out: 3550 [Urine:3500; Blood:50] Intake/Output this shift:    General appearance: alert, mild distress and morbidly obese Resp: rhonchi bilaterally and mild without increased work of breathing GI: abnormal findings:  moderate tenderness in the entire abdomen Incision/Wound: clean and dry  Lab Results:  No results found for this basename: WBC:2,HGB:2,HCT:2,PLT:2 in the last 72 hours BMET No results found for this basename: NA:2,K:2,CL:2,CO2:2,GLUCOSE:2,BUN:2,CREATININE:2,CALCIUM:2 in the last 72 hours   Studies/Results: No results found.  Anti-infectives: Anti-infectives     Start     Dose/Rate Route Frequency Ordered Stop   10/03/11 1110   ceFAZolin (ANCEF) 3 g in dextrose 5 % 50 mL IVPB        3 g 160 mL/hr over 30 Minutes Intravenous 60 min pre-op 10/03/11 1110 10/03/11 1232          Assessment/Plan: s/p Procedure(s): LAPAROSCOPIC VENTRAL HERNIA INSERTION OF MESH Stable but still with significant pain and not ready for discharge. Encouraged to work on pulmonary toilet and ambulation and use her pain medicine as needed.   LOS: 1 day    Kery Haltiwanger T 10/04/2011

## 2011-10-05 LAB — GLUCOSE, CAPILLARY
Glucose-Capillary: 185 mg/dL — ABNORMAL HIGH (ref 70–99)
Glucose-Capillary: 215 mg/dL — ABNORMAL HIGH (ref 70–99)
Glucose-Capillary: 220 mg/dL — ABNORMAL HIGH (ref 70–99)
Glucose-Capillary: 261 mg/dL — ABNORMAL HIGH (ref 70–99)
Glucose-Capillary: 312 mg/dL — ABNORMAL HIGH (ref 70–99)

## 2011-10-05 NOTE — Progress Notes (Signed)
Dr. Johna Sheriff aware BP low at 99/65 with pulse 90. Primary RN reported pt weak and sleepy, wobbly when up to BR. Requires at least one person assist. See new order received to hold am BP medications.

## 2011-10-05 NOTE — Progress Notes (Signed)
Patient ID: Tina [name], female   DOB: 12/16/1970, 41 y.o.   MRN: [id number] 2 Days Post-Op  Subjective: Pain is better today but still significant. She feels a little weak on her right side when she first gets up around which has been present to some degree since a previous stroke. She has been unable to completely empty her bladder and requiring in and out catheter. Tolerating her diet okay.  Objective: Vital signs in last 24 hours: Temp:  [98.1 F (36.7 C)-99.3 F (37.4 C)] 98.1 F (36.7 C) (09/22 0600) Pulse Rate:  [80-88] 80  (09/22 0600) Resp:  [18] 18  (09/22 0600) BP: (96-119)/(58-72) 96/64 mmHg (09/22 0600) SpO2:  [90 %-99 %] 90 % (09/22 0600) Last BM Date: 10/02/11  Intake/Output from previous day: 09/21 0701 - 09/22 0700 In: 940 [P.O.:480; I.V.:240; IV Piggyback:220] Out: 600 [Urine:600] Intake/Output this shift:    General appearance: alert, no distress and morbidly obese GI: abnormal findings:  mild tenderness in the entire abdomen Incision/Wound: clean and dry  Lab Results:  No results found for this basename: WBC:2,HGB:2,HCT:2,PLT:2 in the last 72 hours BMET No results found for this basename: NA:2,K:2,CL:2,CO2:2,GLUCOSE:2,BUN:2,CREATININE:2,CALCIUM:2 in the last 72 hours   Studies/Results: No results found.  Anti-infectives: Anti-infectives     Start     Dose/Rate Route Frequency Ordered Stop   10/03/11 1110   ceFAZolin (ANCEF) 3 g in dextrose 5 % 50 mL IVPB        3 g 160 mL/hr over 30 Minutes Intravenous 60 min pre-op 10/03/11 1110 10/03/11 1232          Assessment/Plan: s/p Procedure(s): LAPAROSCOPIC VENTRAL HERNIA INSERTION OF MESH Stable but not ready for discharge today. Still some degree of urinary retention and not yet very mobile. Continue to encourage mobility and ambulation   LOS: 2 days    Tina [name] T 10/05/2011

## 2011-10-06 ENCOUNTER — Encounter (HOSPITAL_COMMUNITY): Payer: Self-pay | Admitting: Surgery

## 2011-10-06 LAB — GLUCOSE, CAPILLARY
Glucose-Capillary: 133 mg/dL — ABNORMAL HIGH (ref 70–99)
Glucose-Capillary: 118 mg/dL — ABNORMAL HIGH (ref 70–99)
Glucose-Capillary: 171 mg/dL — ABNORMAL HIGH (ref 70–99)

## 2011-10-06 MED ORDER — POLYETHYLENE GLYCOL 3350 17 G PO PACK
17.0000 g | PACK | Freq: Two times a day (BID) | ORAL | Status: DC
  Administered 2011-10-06 – 2011-10-07 (×3): 17 g via ORAL
  Filled 2011-10-06 (×4): qty 1

## 2011-10-06 MED ORDER — LACTULOSE 10 GM/15ML PO SOLN
20.0000 g | Freq: Two times a day (BID) | ORAL | Status: DC | PRN
  Filled 2011-10-06: qty 30

## 2011-10-06 MED ORDER — GABAPENTIN 300 MG PO CAPS
600.0000 mg | ORAL_CAPSULE | Freq: Three times a day (TID) | ORAL | Status: DC
  Administered 2011-10-06 – 2011-10-07 (×4): 600 mg via ORAL
  Filled 2011-10-06 (×6): qty 2

## 2011-10-06 MED ORDER — BISACODYL 10 MG RE SUPP: 10.0000 mg | Freq: Two times a day (BID) | RECTAL | Status: DC | PRN

## 2011-10-06 MED ORDER — BUPIVACAINE 0.25 % ON-Q PUMP DUAL CATH 300 ML
300.0000 mL | INJECTION | Status: DC
  Filled 2011-10-06: qty 300

## 2011-10-06 MED ORDER — FUROSEMIDE 40 MG PO TABS
40.0000 mg | ORAL_TABLET | Freq: Once | ORAL | Status: AC
  Administered 2011-10-06: 40 mg via ORAL
  Filled 2011-10-06 (×2): qty 1

## 2011-10-06 MED ORDER — SODIUM CHLORIDE 0.9 % IJ SOLN: 3.0000 mL | INTRAMUSCULAR | Status: DC | PRN

## 2011-10-06 MED ORDER — SODIUM CHLORIDE 0.9 % IJ SOLN
3.0000 mL | Freq: Two times a day (BID) | INTRAMUSCULAR | Status: DC
  Administered 2011-10-06 (×2): 3 mL via INTRAVENOUS

## 2011-10-06 MED ORDER — TRAMADOL HCL 50 MG PO TABS
50.0000 mg | ORAL_TABLET | Freq: Four times a day (QID) | ORAL | Status: DC | PRN
  Administered 2011-10-06 – 2011-10-07 (×4): 100 mg via ORAL
  Filled 2011-10-06 (×3): qty 2

## 2011-10-06 NOTE — Progress Notes (Signed)
Inpatient Diabetes Program Recommendations  AACE/ADA: New Consensus Statement on Inpatient Glycemic Control (2013)  Target Ranges:  Prepandial:   less than 140 mg/dL      Peak postprandial:   less than 180 mg/dL (1-2 hours)      Critically ill patients:  140 - 180 mg/dL   Reason for Visit: Hyperglycemia  41 yo female s/p lap ventral hernia repair and takedown/re-tacking on bladder.  Insulin-dependent DM. Results for DANARIA, FREI (MRN [id number]) as of 10/06/2011 12:37  Ref. Range 10/04/2011 22:19 10/05/2011 00:19 10/05/2011 08:07 10/05/2011 12:18 10/05/2011 17:01 10/05/2011 21:20 10/06/2011 07:31 10/06/2011 11:58  Glucose-Capillary Latest Range: 70-99 mg/dL 409 (H) 811 (H) 914 (H) 185 (H) 220 (H) 215 (H) 161 (H) 133 (H)    Inpatient Diabetes Program Recommendations Insulin - Meal Coverage: Will need meal coverage insulin if po intake is >50% meal - Add Novolog 3 units tidwc HgbA1C: Updated HgbA1C needed - Last one - 11.6% on 06/17/2009.  Note: Would also recommend adding HS correction insulin.  Will follow.

## 2011-10-06 NOTE — Progress Notes (Signed)
Tina [name] [id number] November 11, 1970   Subjective:  Sore Itching w oxycodone Trying to walk Foley replaced Husband in room Tol solids  Objective:  Vital signs:  Filed Vitals:   10/05/11 1800 10/05/11 2326 10/06/11 0626 10/06/11 1003  BP: 127/78 125/76 105/73 109/63  Pulse: 96 91 81 82  Temp:  98.8 F (37.1 C) 98.8 F (37.1 C) 98.1 F (36.7 C)  TempSrc:  Oral Oral Oral  Resp:      Height:      Weight:      SpO2:  98% 98% 95%    Last BM Date: 10/02/11  Intake/Output   Yesterday:  09/22 0701 - 09/23 0700 In: 875 [P.O.:600; I.V.:275] Out: 2075 [Urine:2075] This shift:  Total I/O In: -  Out: 400 [Urine:400]  Bowel function:  Flatus: n  BM: n  Physical Exam:  General: Pt awake/oriented x4 in no acute distress.  Mildly groggy at 1st but then perks up Eyes: PERRL, normal EOM.  Sclera clear.  No icterus Neuro: CN II-XII intact w/o focal sensory/motor deficits. Lymph: No head/neck/groin lymphadenopathy Psych:  No delerium/psychosis/paranoia. HENT: Normocephalic, Mucus membranes moist.  No thrush Neck: Supple, No tracheal deviation Chest: No chest wall pain w OK excursion.  Dec BS at bases.  On oxygen CV:  Pulses intact.  Regular rhythm Abdomen: Soft.  Nondistended.  Mildly tender at incisions only.  No incarcerated hernias. Ext:  SCDs BLE.  No mjr edema.  No cyanosis Skin: No petechiae / purpurae  Problem List:  Principal Problem:  *Hernia, incisional, RLQ Active Problems:  Right spastic hemiparesis  Lumbago  Obesity (BMI 30-39.9)   Assessment  Tina [name]  41 y.o. female  3 Days Post-Op  Procedure(s): LAPAROSCOPIC VENTRAL HERNIA INSERTION OF MESH  Poor pain control  Plan:  -change PO pain control -replace OnQ -bowel regimen -diuresis -VTE prophylaxis- SCDs, etc -mobilize as tolerated to help recovery -PT/OT evals w h/o stroke & poor mobilization  Ardeth Sportsman, M.D., F.A.C.S. Gastrointestinal and Minimally Invasive  Surgery [facility] [address] [place], [place] [address] [phone] Main / Paging [phone] Voice Mail   10/06/2011  CARE TEAM:  PCP: Michiel Sites, MD  Outpatient Care Team: Patient Care Team: Michiel Sites, MD as PCP - General Serita Kyle, MD as Consulting Physician (Obstetrics and Gynecology)  Inpatient Treatment Team: Treatment Team: Attending Provider: Ardeth Sportsman, MD; Registered Nurse: Tristan Schroeder, RN; Technician: Calton Golds, NT; Technician: Lynden Ang, NT; Technician: Burnard Bunting, [place]; Technician: Vella Raring, NT   Results:   Labs: Results for orders placed during the hospital encounter of 10/03/11 (from the past 48 hour(s))  GLUCOSE, CAPILLARY     Status: Abnormal   Collection Time   10/04/11  5:23 PM      Component Value Range Comment   Glucose-Capillary 293 (*) 70 - 99 mg/dL   GLUCOSE, CAPILLARY     Status: Abnormal   Collection Time   10/04/11  9:19 PM      Component Value Range Comment   Glucose-Capillary 325 (*) 70 - 99 mg/dL    Comment 1 Documented in Chart      Comment 2 Notify RN     GLUCOSE, CAPILLARY     Status: Abnormal   Collection Time   10/04/11 10:19 PM      Component Value Range Comment   Glucose-Capillary 333 (*) 70 - 99 mg/dL   GLUCOSE, CAPILLARY  Status: Abnormal   Collection Time   10/05/11 12:19 AM      Component Value Range Comment   Glucose-Capillary 312 (*) 70 - 99 mg/dL   GLUCOSE, CAPILLARY     Status: Abnormal   Collection Time   10/05/11  8:07 AM      Component Value Range Comment   Glucose-Capillary 261 (*) 70 - 99 mg/dL    Comment 1 Notify RN     GLUCOSE, CAPILLARY     Status: Abnormal   Collection Time   10/05/11 12:18 PM      Component Value Range Comment   Glucose-Capillary 185 (*) 70 - 99 mg/dL   GLUCOSE, CAPILLARY     Status: Abnormal   Collection Time   10/05/11  5:01 PM      Component Value Range  Comment   Glucose-Capillary 220 (*) 70 - 99 mg/dL   GLUCOSE, CAPILLARY     Status: Abnormal   Collection Time   10/05/11  9:20 PM      Component Value Range Comment   Glucose-Capillary 215 (*) 70 - 99 mg/dL   GLUCOSE, CAPILLARY     Status: Abnormal   Collection Time   10/06/11  7:31 AM      Component Value Range Comment   Glucose-Capillary 161 (*) 70 - 99 mg/dL   GLUCOSE, CAPILLARY     Status: Abnormal   Collection Time   10/06/11 11:58 AM      Component Value Range Comment   Glucose-Capillary 133 (*) 70 - 99 mg/dL     Imaging / Studies: No results found.  Medications / Allergies: per chart  Antibiotics: Anti-infectives     Start     Dose/Rate Route Frequency Ordered Stop   10/03/11 1110   ceFAZolin (ANCEF) 3 g in dextrose 5 % 50 mL IVPB        3 g 160 mL/hr over 30 Minutes Intravenous 60 min pre-op 10/03/11 1110 10/03/11 1232

## 2011-10-06 NOTE — Care Management Note (Addendum)
    Page 1 of 2   10/07/2011     11:29:15 AM   CARE MANAGEMENT NOTE 10/07/2011  Patient:  Tina [name], Tina [name]   Account Number:  [id number]  Date Initiated:  10/06/2011  Documentation initiated by:  Lorenda Ishihara  Subjective/Objective Assessment:   41 yo female admitted s/p ventral hernia repair. PTA lived at home with spouse.     Action/Plan:   Anticipated DC Date:  10/08/2011   Anticipated DC Plan:  HOME W HOME HEALTH SERVICES      DC Planning Services  CM consult      PAC Choice  DURABLE MEDICAL EQUIPMENT  HOME HEALTH   Choice offered to / List presented to:  C-1 Patient   DME arranged  Levan Hurst      DME agency  [facility]     HH arranged  HH-2 PT  HH-3 OT      [facility] agency  [facility]   Status of service:  Completed, signed off Medicare Important Message given?   (If response is "NO", the following Medicare IM given date fields will be blank) Date Medicare IM given:   Date Additional Medicare IM given:    Discharge Disposition:  HOME W HOME HEALTH SERVICES  Per UR Regulation:  Reviewed for med. necessity/level of care/duration of stay  If discussed at Long Length of Stay Meetings, dates discussed:    Comments:  10-07-11 Lorenda Ishihara RN CM 1100 Spoke with patient at bedside regarding HH needs. Per PT/OT would recommend [facility] PT/OT, also will need RW for ambulation. Patient wants to use [facility] for [facility] services, contacted Darl Pikes with [facility] to arrange. She will f/u with patient.

## 2011-10-06 NOTE — Evaluation (Signed)
Physical Therapy Evaluation Patient Details Name: Tina [name] MRN: [id number] DOB: 08/28/1970 Today's Date: 10/06/2011 Time: 8295-6213 PT Time Calculation (min): 18 min  PT Assessment / Plan / Recommendation Clinical Impression  41 yo female s/p lap ventral hernia repair and takedown/re-tacking on bladder. Hx of CVA with some residual weakness on R side. Pt also reports R rotator cuff injury. Increased difficulty with bed mobility from flat surface. Pt also demonstrates unsteady gait pattern. Reports hx of dizziness after CVA but intensity has been higher since surgery.  Discussed/demosntrated some LE exercises pt can peform seated or supine. Explained to pt that she may need to stay on 1st level of home until dizziness improves. Recommend HHPT at discharge.     PT Assessment  Patient needs continued PT services    Follow Up Recommendations  Home health PT;Supervision for mobility/OOB    Barriers to Discharge        Equipment Recommendations  None recommended by PT    Recommendations for Other Services OT consult   Frequency Min 3X/week    Precautions / Restrictions Precautions Precautions: Fall Precaution Comments: Abdominal surgery Restrictions Weight Bearing Restrictions: No   Pertinent Vitals/Pain       Mobility  Bed Mobility Bed Mobility: Rolling Right;Right Sidelying to Sit Rolling Right: 4: Min assist Right Sidelying to Sit: 3: Mod assist Details for Bed Mobility Assistance: Practiced from flat bed to simulate home environment. Assist for bil LEs off bed and trunk to upright. Increased time and pain with bed mobility. Suggested roll to L side to limit stress on R UE, but pt states she will have to get out on R side of bed at home.  Transfers Transfers: Sit to Stand;Stand to Sit Sit to Stand: 4: Min assist;From bed;With upper extremity assist Stand to Sit: 4: Min guard;To chair/3-in-1;With upper extremity assist;With armrests Details for Transfer Assistance:  VCs safety, technqiue, hand placement. Asssit to rise, steady.  Ambulation/Gait Ambulation/Gait Assistance: 4: Min assist Ambulation Distance (Feet): 125 Feet Ambulation/Gait Assistance Details: Pt unsteady throughout ambulation with moments of increased instability (pt states occurs with head turns). Slow gait speed with intermittent assist needed to steady Gait Pattern: Step-through pattern;Decreased stride length;Decreased step length - right;Decreased step length - left    Exercises     PT Diagnosis: Difficulty walking;Generalized weakness;Acute pain;Abnormality of gait  PT Problem List: Decreased mobility;Decreased balance;Decreased activity tolerance;Pain;Decreased strength PT Treatment Interventions: DME instruction;Gait training;Stair training;Functional mobility training;Therapeutic activities;Therapeutic exercise;Balance training;Patient/family education   PT Goals Acute Rehab PT Goals PT Goal Formulation: With patient Time For Goal Achievement: 10/13/11 Potential to Achieve Goals: Good Pt will Roll Supine to Right Side: with supervision PT Goal: Rolling Supine to Right Side - Progress: Goal set today Pt will go Supine/Side to Sit: with supervision PT Goal: Supine/Side to Sit - Progress: Goal set today Pt will go Sit to Stand: with supervision PT Goal: Sit to Stand - Progress: Goal set today Pt will Ambulate: 51 - 150 feet;with supervision;with least restrictive assistive device PT Goal: Ambulate - Progress: Goal set today Pt will Go Up / Down Stairs: 6-9 stairs;with min assist;with rail(s) PT Goal: Up/Down Stairs - Progress: Goal set today  Visit Information  Last PT Received On: 10/06/11 Assistance Needed: +1    Subjective Data  Subjective: "I have a torn rotator cuff on the R side" Patient Stated Goal: home   Prior Functioning  Home Living Lives With: Son;Spouse;Family Available Help at Discharge: Family (husband, niece, sister (on weekends)) Type of Home:  House Home Access: Stairs to enter [employer] of Steps: 3 Entrance Stairs-Rails: Right (in garage) Home Layout: Two level;Able to live on main level with bedroom/bathroom (if absolutely necessary) Alternate Level Stairs-Number of Steps: 1 flight with platform in between  [employer]: Standard Home Adaptive Equipment: Straight cane Prior Function Level of Independence: Independent with assistive device(s) Able to Take Stairs?: Yes Driving: Yes Comments: ambulated with cane Communication Communication: No difficulties    Cognition  Overall Cognitive Status: Appears within functional limits for tasks assessed/performed Arousal/Alertness: Awake/alert Orientation Level: Appears intact for tasks assessed Behavior During Session: [facility] for tasks performed    Extremity/Trunk Assessment Right Lower Extremity Assessment RLE ROM/Strength/Tone: Deficits RLE ROM/Strength/Tone Deficits: Pt reports some residual weakness from prior CVA. On exam. strength at least 4/5 throughout Left Lower Extremity Assessment LLE ROM/Strength/Tone: [facility] for tasks assessed   Balance Balance Balance Assessed: Yes  End of Session PT - End of Session Activity Tolerance: Patient limited by pain (Limited by dizziness) Patient left: in chair;with call bell/phone within reach  GP     Rebeca Alert [facility] 10/06/2011, 12:28 PM [phone]

## 2011-10-07 ENCOUNTER — Telehealth (INDEPENDENT_AMBULATORY_CARE_PROVIDER_SITE_OTHER): Payer: Self-pay | Admitting: General Surgery

## 2011-10-07 DIAGNOSIS — R338 Other retention of urine: Secondary | ICD-10-CM | POA: Diagnosis not present

## 2011-10-07 LAB — GLUCOSE, CAPILLARY: Glucose-Capillary: 104 mg/dL — ABNORMAL HIGH (ref 70–99)

## 2011-10-07 MED ORDER — TRAMADOL HCL 50 MG PO TABS: 50.0000 mg | ORAL_TABLET | Freq: Four times a day (QID) | ORAL | Status: DC | PRN

## 2011-10-07 MED ORDER — LACTULOSE 10 GM/15ML PO SOLN
30.0000 g | Freq: Once | ORAL | Status: AC
  Administered 2011-10-07: 30 g via ORAL
  Filled 2011-10-07: qty 45

## 2011-10-07 MED ORDER — INFLUENZA VIRUS VACC SPLIT PF IM SUSP
0.5000 mL | INTRAMUSCULAR | Status: AC
  Administered 2011-10-07: 0.5 mL via INTRAMUSCULAR
  Filled 2011-10-07 (×2): qty 0.5

## 2011-10-07 MED ORDER — FUROSEMIDE 40 MG PO TABS
40.0000 mg | ORAL_TABLET | Freq: Once | ORAL | Status: AC
  Administered 2011-10-07: 40 mg via ORAL
  Filled 2011-10-07: qty 1

## 2011-10-07 NOTE — Telephone Encounter (Signed)
Per Dr. Michaell Cowing, called [facility]: [phone], to verify receipt of electronic script for Tramadol.  They had received it.  Gave new orders for Phenergan 12.5 mg, # 25, 1-2 po Q6H prn n/v, 2 RF and Phenergan 25 mg supp,  # 5, 1 per rectum Q6H prn n/v, 2 RF.

## 2011-10-07 NOTE — Progress Notes (Signed)
Physical Therapy Treatment Patient Details Name: Tina [name] MRN: [id number] DOB: 05/28/1970 Today's Date: 10/07/2011 Time: 1191-4782 PT Time Calculation (min): 16 min  PT Assessment / Plan / Recommendation Comments on Treatment Session  Follow up session to further assess ambulation and stair negotiation. Pt working with OT when therapist entered room. Initially pt c/o increased dizziness today when mobilizing. A short time after, pt reports improvement in dizziness but has c/o increased pain L hip. Pt's husband present. Recommended use of RW, assist for any/all mobility, and for pt to remain on 1st level of home until  she has worked with therapy.     Follow Up Recommendations  Home health PT;Supervision/Assistance - 24 hour    Barriers to Discharge        Equipment Recommendations  Rolling walker with 5" wheels    Recommendations for Other Services OT consult  Frequency Min 3X/week   Plan Discharge plan remains appropriate    Precautions / Restrictions Precautions Precautions: Fall Precaution Comments: abdominal surgery. hx of dizziness-pt states symptoms tend to intensify after surgeries.  Restrictions Weight Bearing Restrictions: No   Pertinent Vitals/Pain 10/10 L hip with ambulation.     Mobility  Bed Mobility Bed Mobility: Supine to Sit Supine to Sit: 5: Supervision;HOB elevated Details for Bed Mobility Assistance: HOB elevated to about 90 degress. Pt turned and scooted to EOB with supervision.  Transfers Transfers: Stand to Sit Sit to Stand: 4: Min guard;From bed;From toilet;With upper extremity assist Stand to Sit: 4: Min guard;With upper extremity assist;With armrests Details for Transfer Assistance: verbal cues for safety Ambulation/Gait Ambulation/Gait Assistance: 4: Min assist Ambulation Distance (Feet): 100 Feet Assistive device: Straight cane Ambulation/Gait Assistance Details: slow gait speed. Used cane this session for improved stability. As pt  coming out bathroom with OT, pt c/o dizziness, standing holding onto wall. Pt able to continue ambulation with PT in hallway and actually stated dizziness improved with time/distance. However, pt c/o "sciatic" pain L hip. Pt required several rest breaks and increased assistance when walking back to room. Pt unable to continue ambulation.  Gait Pattern: Step-to pattern;Step-through pattern;Decreased stride length;Decreased step length - right;Decreased step length - left;Trunk flexed Stairs: Yes Stairs Assistance: 4: Min assist Stairs Assistance Details (indicate cue type and reason): Assist to stabilize. VCS safety, technique, sequence.  Stair Management Technique: Two rails Number of Stairs: 3     Exercises     PT Diagnosis:    PT Problem List:   PT Treatment Interventions:     PT Goals Acute Rehab PT Goals Pt will Ambulate: 51 - 150 feet;with supervision PT Goal: Ambulate - Progress: Progressing toward goal Pt will Go Up / Down Stairs: 6-9 stairs;with min assist;with rail(s) PT Goal: Up/Down Stairs - Progress: Progressing toward goal  Visit Information  Last PT Received On: 10/07/11 Assistance Needed: +1    Subjective Data  Subjective: "My hip is hurting" Patient Stated Goal: Home   Cognition  Overall Cognitive Status: Appears within functional limits for tasks assessed/performed Arousal/Alertness: Awake/alert Orientation Level: Appears intact for tasks assessed Behavior During Session: [facility] for tasks performed    Balance  Balance Balance Assessed: Yes Dynamic Standing Balance Dynamic Standing - Balance Support: Right upper extremity supported Dynamic Standing - Level of Assistance: 4: Min assist Dynamic Standing - Comments: for maniipulating gown  End of Session PT - End of Session Equipment Utilized During Treatment: Gait belt Activity Tolerance: Patient limited by pain (Limited by dizziness) Patient left: in chair;with call bell/phone within reach;with  family/visitor  present   GP     Rebeca Alert [facility] 10/07/2011, 10:25 AM [phone]

## 2011-10-07 NOTE — Evaluation (Signed)
Occupational Therapy Evaluation Patient Details Name: Tina [name] MRN: [id number] DOB: Apr 21, 1970 Today's Date: 10/07/2011 Time: 7253-6644 OT Time Calculation (min): 33 min  OT Assessment / Plan / Recommendation Clinical Impression  41 yo female s/p lap ventral hernia repair and takedown/re-tacking on bladder. Hx of CVA with some residual weakness on R side. Pt also reports R rotator cuff injury.Pt displays increased pain, history of dizziness that is present with functional mobility during eval and overall decreased safety with ADL due to unsteadiness/pain. Pt will benefit from skilled OT services to improve ADL independence.     OT Assessment  Patient needs continued OT Services    Follow Up Recommendations  Home health OT;Supervision/Assistance - 24 hour    Barriers to Discharge      Equipment Recommendations  Rolling walker with 5" wheels    Recommendations for Other Services    Frequency  Min 2X/week    Precautions / Restrictions Precautions Precautions: Fall Precaution Comments: abdominal surgery. hx of dizziness Restrictions Weight Bearing Restrictions: No        ADL  Eating/Feeding: Simulated;Independent Where Assessed - Eating/Feeding: Bed level Grooming: Simulated;Wash/dry face;Set up Where Assessed - Grooming: Unsupported sitting Upper Body Bathing: Simulated;Chest;Right arm;Left arm;Abdomen;Supervision/safety;Set up Where Assessed - Upper Body Bathing: Unsupported sitting Lower Body Bathing: Simulated;Minimal assistance;Other (comment) (steady support to stand and wash for balance) Where Assessed - Lower Body Bathing: Supported sit to stand Upper Body Dressing: Simulated;Set up;Supervision/safety Where Assessed - Upper Body Dressing: Unsupported sitting Lower Body Dressing: Simulated;Minimal assistance;Other (comment) (balance support) Where Assessed - Lower Body Dressing: Supported sit to stand Toilet Transfer: Performed;Minimal assistance;Other  (comment) (no device. pt dizzy and unsteady in and out of bathroom) Toilet Transfer Method: Other (comment) (transfer into bathroom to toilet) Toilet Transfer Equipment: Comfort height toilet;Grab bars Toileting - Clothing Manipulation and Hygiene: Simulated;Minimal assistance Where Assessed - Engineer, mining and Hygiene: Sit to stand from 3-in-1 or toilet Tub/Shower Transfer: Performed;Minimal assistance Equipment Used: Cane ADL Comments: Pt came during part of session and used can for functional mobility in room after bathroom transfer. PT recommending RW as she is more unsteady than yesterday and still unsteady with even cane. Discussed safety with not showring until pt's dizziness resolves and L hip pain improves as both factors currently make her unsafe to shower. When she does shower, recommend use of shower chair for duration of shower. Pt and spouse awawre of recommendations. Pt has to look down when she mobilizes as the dizziness is more intense when she looks up and tries to move around. Pt states she had dizziness after her stroke and her C section but seems to be worse this time.     OT Diagnosis: Generalized weakness;Acute pain  OT Problem List: Decreased strength;Decreased activity tolerance;Decreased knowledge of use of DME or AE;Pain OT Treatment Interventions: Self-care/ADL training;Therapeutic activities;DME and/or AE instruction;Patient/family education   OT Goals Acute Rehab OT Goals OT Goal Formulation: With patient Time For Goal Achievement: 10/21/11 Potential to Achieve Goals: Good ADL Goals Pt Will Perform Grooming: with supervision;Standing at sink ADL Goal: Grooming - Progress: Goal set today Pt Will Perform Lower Body Bathing: with supervision;Sit to stand from chair;Sit to stand from bed ADL Goal: Lower Body Bathing - Progress: Goal set today Pt Will Perform Lower Body Dressing: with supervision;Sit to stand from chair;Sit to stand from bed ADL  Goal: Lower Body Dressing - Progress: Goal set today Pt Will Transfer to Toilet: with supervision;Ambulation;Comfort height toilet ADL Goal: Toilet Transfer - Progress: Goal  set today Pt Will Perform Toileting - Clothing Manipulation: with supervision;Standing ADL Goal: Toileting - Clothing Manipulation - Progress: Goal set today Pt Will Perform Tub/Shower Transfer: with supervision;Shower seat without back ADL Goal: Web designer - Progress: Goal set today  Visit Information  Last OT Received On: 10/07/11 Assistance Needed: +1    Subjective Data  Subjective: I washed up this morning in the bathroom Patient Stated Goal: none stated. agreeable to work with OT   Prior Functioning  Vision/Perception  Home Living Lives With: Son;Spouse;Family Available Help at Discharge: Family (husband, niece, sister (on weekends)) Type of Home: House Home Access: Stairs to enter [employer] of Steps: 3 Entrance Stairs-Rails: Right (in garage) Home Layout: Two level;Able to live on main level with bedroom/bathroom (if absolutely necessary) Alternate Level Stairs-Number of Steps: 1 flight with platform in between  [employer] Shower/Tub: [employer] manager Toilet: Standard Home Adaptive Equipment: Straight cane;Shower chair without back Additional Comments: pt states husband can take time off to be with her at home Prior Function Level of Independence: Independent with assistive device(s) Able to Take Stairs?: Yes Driving: Yes Communication Communication: No difficulties      Cognition  Overall Cognitive Status: Appears within functional limits for tasks assessed/performed Arousal/Alertness: Awake/alert Orientation Level: Appears intact for tasks assessed Behavior During Session: [facility] for tasks performed    Extremity/Trunk Assessment Right Upper Extremity Assessment RUE ROM/Strength/Tone: Deficits RUE ROM/Strength/Tone Deficits: Shoulder ROM to 90 degrees actively before  pain. Pt reports history of rotator cuff tear in July this year and no surgery planned. Pt also wtih history of stroke on R side. Pt with at least 3+/5 strength elbow distal but didnt give alot of resistance due to some discomfort at shoulder with resistance.  Left Upper Extremity Assessment LUE ROM/Strength/Tone: WFL for tasks assessed   Mobility  Shoulder Instructions  Bed Mobility Bed Mobility: Supine to Sit Supine to Sit: 5: Supervision;HOB elevated Details for Bed Mobility Assistance: HOB elevated to about 90 degress. Pt turned and scooted to EOB with supervision.  Transfers Transfers: Sit to Stand;Stand to Sit Sit to Stand: 4: Min guard;From bed;From toilet;With upper extremity assist Stand to Sit: 4: Min guard;With upper extremity assist;With armrests Details for Transfer Assistance: verbal cues for safety       Exercise     Balance Balance Balance Assessed: Yes Dynamic Standing Balance Dynamic Standing - Balance Support: Right upper extremity supported Dynamic Standing - Level of Assistance: 4: Min assist Dynamic Standing - Comments: for maniipulating gown   End of Session OT - End of Session Equipment Utilized During Treatment: Gait belt Activity Tolerance: Patient limited by pain;Other (comment) (dizziness) Patient left: in chair;with call bell/phone within reach;with family/visitor present  GO     Lennox Laity [phone] 10/07/2011, 10:27 AM

## 2011-10-07 NOTE — Progress Notes (Signed)
Patient discharged home, notified Gross of patient vomiting about 74ml,prescriptions given, gross stated that order would be called in for nausea medication, patient and spouse taught concerning foley care and on q pump, vital signs are stable, patient to follow up with gross Means, Denetra Formoso N RN 13:42pm

## 2011-10-07 NOTE — Discharge Summary (Signed)
Physician Discharge Summary  Patient ID: Tina [name] MRN: [id number] DOB/AGE: [birth year] 41 y.o.  Admit date: 10/03/2011 Discharge date: 10/07/2011  Admission Diagnoses: Principal Problem:  *Hernia, incisional, RLQ Active Problems:  Right spastic hemiparesis  Lumbago  Obesity (BMI 30-39.9)   Discharge Diagnoses:  Principal Problem:  *Hernia, incisional, RLQ Active Problems:  Right spastic hemiparesis  Lumbago  Obesity (BMI 30-39.9)  Acute urinary retention s/p Foley 10/05/2011   Discharged Condition: good  Hospital Course:   Obese female with history of prior stroke.  Developed incisional hernia at her C-section incision.  Underwent a laparoscopic bladder takedown and ventral hernia repair with bladder retacking.  Postoperatively, the patient mobilized and advanced to a solid diet gradually.  Pain was well-controlled and transitioned off IV medications.  She began to have flatus.  Was placed on a bowel regimen.  Struggled with urinary retention and had to have the Foley replaced.  By the time of discharge, the patient was walking well the hallways, eating food well, having flatus.  Pain was-controlled on an oral regimen.  Based on meeting DC criteria and recovering well, I felt it was safe for the patient to be discharged home with close followup.  Physical therapy recommended home health PT.  We'll work to set that up.  Instructions were discussed in detail.  They are written as well.  She can followup in our office in a few days to have the On-Q pain pump catheter and urinary catheters removed.  She felt comfortable that they would be able to self DC the catheters.  They will call with any issues.  Otherwise I will followup in 2 weeks.    Consults: None  Significant Diagnostic Studies:   Treatments: surgery: Lap VWH repair w mesh  Discharge Exam: Blood pressure 119/67, pulse 86, temperature 98 F (36.7 C), temperature source Oral, resp. rate 20, height 5\' 6"  (1.676  m), weight 242 lb (109.77 kg), last menstrual period 09/13/2011, SpO2 100.00%.  General: Pt awake/alert/oriented x4 in no major acute distress Eyes: PERRL, normal EOM. Sclera nonicteric Neuro: CN II-XII intact w/o focal sensory/motor deficits. Lymph: No head/neck/groin lymphadenopathy Psych:  No delerium/psychosis/paranoia HENT: Normocephalic, Mucus membranes moist.  No thrush Neck: Supple, No tracheal deviation Chest: No pain.  Good respiratory excursion. CV:  Pulses intact.  Regular rhythm Abdomen: Soft, Nondistended.  Mildly tender at incisions only.  No incarcerated hernias. Ext:  SCDs BLE.  No significant edema.  No cyanosis Skin: No petechiae / purpurae   Disposition: 01-Home or Self Care  Discharge Orders    Future Appointments: Provider: Department: Dept Phone: Center:   10/23/2011 4:45 PM Ardeth Sportsman, MD [facility] [phone] None     Future Orders Please Complete By Expires   Diet - low sodium heart healthy      Increase activity slowly          Medication List     As of 10/07/2011  7:26 AM    TAKE these medications         aspirin 325 MG tablet   Take 325 mg by mouth daily. For pain      EXFORGE HCT 10-320-25 MG Tabs   Generic drug: Amlodipine-Valsartan-HCTZ   Take 1 tablet by mouth every evening.      gabapentin 300 MG capsule   Commonly known as: NEURONTIN   TAKE 1 CAPSULE BY MOUTH 3 TIMES A DAY      insulin glargine 100 UNIT/ML injection   Commonly known as: LANTUS  Inject 20-22 Units into the skin 2 (two) times daily. Inject 22 units in the morning and 20 units in the evening      insulin lispro 100 UNIT/ML injection   Commonly known as: HUMALOG   Inject 6-10 Units into the skin 3 (three) times daily before meals.      multivitamin with minerals Tabs   Take 1 tablet by mouth daily.      oxyCODONE 5 MG immediate release tablet   Commonly known as: Oxy IR/ROXICODONE   Take 1-2 tablets (5-10 mg total) by mouth every 4 (four) hours as  needed for pain.      tiZANidine 4 MG capsule   Commonly known as: ZANAFLEX   Take 4 mg by mouth at bedtime.      traMADol 50 MG tablet   Commonly known as: ULTRAM   Take 1-2 tablets (50-100 mg total) by mouth every 6 (six) hours as needed.           Follow-up Information    Follow up with Tina Chesney C., MD. In 3 weeks.   Contact information:   [address] [place] [address] [phone]          Signed: Ardeth Sportsman. 10/07/2011, 7:26 AM

## 2011-10-07 NOTE — Progress Notes (Signed)
Educated patient and spouse on foley care at home with teachback, hand out given and converted to leg bag, questions and concerns answered, educating husband on removal of on q pump for Friday Means, Myrtie Hawk RN 10-07-11 11:23am

## 2011-10-09 ENCOUNTER — Telehealth (INDEPENDENT_AMBULATORY_CARE_PROVIDER_SITE_OTHER): Payer: Self-pay

## 2011-10-09 NOTE — Telephone Encounter (Signed)
Called pt back after the message left that her little toe is still numb from surgery. The pt had surgery on 9/20 for lap ventral hernia by Dr Michaell Cowing and at the time of surgery all her toes were numb on the right foot but the feeling has come back in all the toes except the last little toe. The pt wanted to report this to you b/c she doesn't know what to do about this. I advised pt that it prob. Just takes a little more time but I would notify DR Gross.

## 2011-10-13 NOTE — Telephone Encounter (Signed)
I do not have a good answer for this.  Very doubtful that her lower lumbar sensory nerves were affected by surgery but that is a risk.  Should gradually improve or resolve on its own.  There is muscle weakness as well, may need to run by her primary care physician neurology evaluation.  For now monitor and should improve.  Do a  trial of heat and anti-inflammatory such as Aleve two by mouth twice a day and ice and/or heat six times a day.

## 2011-10-13 NOTE — Telephone Encounter (Signed)
Spoke to patient. Made her aware this will probably get better. Advised about heat. She will try it and get back with [name] in a couple weeks if no better. She also got dizzy earlier today and fell. Not having increased pain since then but she is sore, more on the left than the right. Her incisions look good. I advised to try ibuprofen between her doses of pain medicine to help with the soreness on the left side and to help with her toe. She also complained of one episode of a sharp pain and a "popping" feeling during a bowel movement. She has not had any sharp pain since but just some deep soreness with bowel movements. I advised this should get better and to keep an eye on this and her dizziness and let us know if these things happen again. She is drinking liquids and taking miralax. She will follow up at her appt next week.

## 2011-10-14 ENCOUNTER — Encounter (HOSPITAL_COMMUNITY): Payer: Self-pay | Admitting: *Deleted

## 2011-10-14 ENCOUNTER — Emergency Department (HOSPITAL_COMMUNITY)
Admission: EM | Admit: 2011-10-14 | Discharge: 2011-10-15 | Disposition: A | Discharge status: Home / Self Care | Payer: Self-pay | Attending: Emergency Medicine | Admitting: Emergency Medicine

## 2011-10-14 DIAGNOSIS — G2581 Restless legs syndrome: Secondary | ICD-10-CM | POA: Insufficient documentation

## 2011-10-14 DIAGNOSIS — M545 Low back pain, unspecified: Secondary | ICD-10-CM | POA: Insufficient documentation

## 2011-10-14 DIAGNOSIS — I69959 Hemiplegia and hemiparesis following unspecified cerebrovascular disease affecting unspecified side: Secondary | ICD-10-CM | POA: Insufficient documentation

## 2011-10-14 DIAGNOSIS — R51 Headache: Secondary | ICD-10-CM | POA: Insufficient documentation

## 2011-10-14 DIAGNOSIS — Z888 Allergy status to other drugs, medicaments and biological substances status: Secondary | ICD-10-CM | POA: Insufficient documentation

## 2011-10-14 DIAGNOSIS — F3289 Other specified depressive episodes: Secondary | ICD-10-CM | POA: Insufficient documentation

## 2011-10-14 DIAGNOSIS — E1142 Type 2 diabetes mellitus with diabetic polyneuropathy: Secondary | ICD-10-CM | POA: Insufficient documentation

## 2011-10-14 DIAGNOSIS — D649 Anemia, unspecified: Secondary | ICD-10-CM | POA: Insufficient documentation

## 2011-10-14 DIAGNOSIS — I10 Essential (primary) hypertension: Secondary | ICD-10-CM | POA: Insufficient documentation

## 2011-10-14 DIAGNOSIS — Z794 Long term (current) use of insulin: Secondary | ICD-10-CM | POA: Insufficient documentation

## 2011-10-14 DIAGNOSIS — M76899 Other specified enthesopathies of unspecified lower limb, excluding foot: Secondary | ICD-10-CM | POA: Insufficient documentation

## 2011-10-14 DIAGNOSIS — F329 Major depressive disorder, single episode, unspecified: Secondary | ICD-10-CM | POA: Insufficient documentation

## 2011-10-14 DIAGNOSIS — Z7982 Long term (current) use of aspirin: Secondary | ICD-10-CM | POA: Insufficient documentation

## 2011-10-14 DIAGNOSIS — F411 Generalized anxiety disorder: Secondary | ICD-10-CM | POA: Insufficient documentation

## 2011-10-14 DIAGNOSIS — E1149 Type 2 diabetes mellitus with other diabetic neurological complication: Secondary | ICD-10-CM | POA: Insufficient documentation

## 2011-10-14 LAB — CBC WITH DIFFERENTIAL/PLATELET
Basophils Absolute: 0 10*3/uL (ref 0.0–0.1)
Eosinophils Absolute: 0.5 10*3/uL (ref 0.0–0.7)
Eosinophils Relative: 4 % (ref 0–5)
Lymphocytes Relative: 18 % (ref 12–46)
MCH: 26 pg (ref 26.0–34.0)
MCV: 78.9 fL (ref 78.0–100.0)
Neutrophils Relative %: 74 % (ref 43–77)
Platelets: 420 10*3/uL — ABNORMAL HIGH (ref 150–400)
RDW: 14.5 % (ref 11.5–15.5)
WBC: 11.5 10*3/uL — ABNORMAL HIGH (ref 4.0–10.5)

## 2011-10-14 LAB — URINALYSIS, ROUTINE W REFLEX MICROSCOPIC
Bilirubin Urine: NEGATIVE
Leukocytes, UA: NEGATIVE
Nitrite: NEGATIVE
Specific Gravity, Urine: 1.016 (ref 1.005–1.030)
Urobilinogen, UA: 1 mg/dL (ref 0.0–1.0)

## 2011-10-14 LAB — COMPREHENSIVE METABOLIC PANEL
Alkaline Phosphatase: 72 U/L (ref 39–117)
BUN: 23 mg/dL (ref 6–23)
CO2: 22 mEq/L (ref 19–32)
Chloride: 101 mEq/L (ref 96–112)
Creatinine, Ser: 1.15 mg/dL — ABNORMAL HIGH (ref 0.50–1.10)
GFR calc Af Amer: 67 mL/min — ABNORMAL LOW (ref >90)
GFR calc non Af Amer: 58 mL/min — ABNORMAL LOW (ref >90)
Glucose, Bld: 205 mg/dL — ABNORMAL HIGH (ref 70–99)
Potassium: 4 mEq/L (ref 3.5–5.1)
Total Bilirubin: 0.2 mg/dL — ABNORMAL LOW (ref 0.3–1.2)

## 2011-10-14 LAB — URINE MICROSCOPIC-ADD ON

## 2011-10-14 NOTE — ED Notes (Signed)
The pt is c/o head pressure since Tuesday when she came home from the hospital after having abd hernia surgery.  She also feel confused intermittently.  lmp aug

## 2011-10-14 NOTE — ED Notes (Signed)
The pt  Face is symmetrical and she has no arm drift

## 2011-10-15 ENCOUNTER — Encounter (HOSPITAL_COMMUNITY): Payer: Self-pay | Admitting: *Deleted

## 2011-10-15 ENCOUNTER — Observation Stay (HOSPITAL_COMMUNITY): Payer: Federal, State, Local not specified - PPO

## 2011-10-15 ENCOUNTER — Inpatient Hospital Stay (HOSPITAL_COMMUNITY)
Admission: AD | Admit: 2011-10-15 | Discharge: 2011-10-19 | DRG: 464 | Disposition: A | Discharge status: Home / Self Care | Payer: Federal, State, Local not specified - PPO | Source: Ambulatory Visit | Attending: Surgery | Admitting: Surgery

## 2011-10-15 ENCOUNTER — Ambulatory Visit (INDEPENDENT_AMBULATORY_CARE_PROVIDER_SITE_OTHER): Payer: Federal, State, Local not specified - PPO | Admitting: General Surgery

## 2011-10-15 ENCOUNTER — Encounter (INDEPENDENT_AMBULATORY_CARE_PROVIDER_SITE_OTHER): Payer: Self-pay | Admitting: General Surgery

## 2011-10-15 VITALS — BP 198/114 | HR 104 | Temp 98.2°F | Resp 20 | Ht 66.5 in | Wt 244.2 lb

## 2011-10-15 DIAGNOSIS — R51 Headache: Secondary | ICD-10-CM | POA: Diagnosis present

## 2011-10-15 DIAGNOSIS — Y92009 Unspecified place in unspecified non-institutional (private) residence as the place of occurrence of the external cause: Secondary | ICD-10-CM

## 2011-10-15 DIAGNOSIS — Z8669 Personal history of other diseases of the nervous system and sense organs: Secondary | ICD-10-CM

## 2011-10-15 DIAGNOSIS — Z79899 Other long term (current) drug therapy: Secondary | ICD-10-CM

## 2011-10-15 DIAGNOSIS — R110 Nausea: Secondary | ICD-10-CM | POA: Diagnosis present

## 2011-10-15 DIAGNOSIS — E669 Obesity, unspecified: Secondary | ICD-10-CM | POA: Diagnosis present

## 2011-10-15 DIAGNOSIS — Z9889 Other specified postprocedural states: Secondary | ICD-10-CM

## 2011-10-15 DIAGNOSIS — R52 Pain, unspecified: Secondary | ICD-10-CM

## 2011-10-15 DIAGNOSIS — K5900 Constipation, unspecified: Secondary | ICD-10-CM | POA: Diagnosis present

## 2011-10-15 DIAGNOSIS — F411 Generalized anxiety disorder: Secondary | ICD-10-CM | POA: Diagnosis present

## 2011-10-15 DIAGNOSIS — R55 Syncope and collapse: Secondary | ICD-10-CM | POA: Diagnosis present

## 2011-10-15 DIAGNOSIS — E1165 Type 2 diabetes mellitus with hyperglycemia: Secondary | ICD-10-CM | POA: Diagnosis present

## 2011-10-15 DIAGNOSIS — K432 Incisional hernia without obstruction or gangrene: Secondary | ICD-10-CM | POA: Diagnosis present

## 2011-10-15 DIAGNOSIS — D72829 Elevated white blood cell count, unspecified: Secondary | ICD-10-CM | POA: Diagnosis not present

## 2011-10-15 DIAGNOSIS — Z8673 Personal history of transient ischemic attack (TIA), and cerebral infarction without residual deficits: Secondary | ICD-10-CM

## 2011-10-15 DIAGNOSIS — T40605A Adverse effect of unspecified narcotics, initial encounter: Secondary | ICD-10-CM | POA: Diagnosis present

## 2011-10-15 DIAGNOSIS — Z794 Long term (current) use of insulin: Secondary | ICD-10-CM

## 2011-10-15 DIAGNOSIS — M545 Low back pain, unspecified: Secondary | ICD-10-CM

## 2011-10-15 DIAGNOSIS — R296 Repeated falls: Secondary | ICD-10-CM | POA: Diagnosis present

## 2011-10-15 DIAGNOSIS — Z6841 Body Mass Index (BMI) 40.0 and over, adult: Secondary | ICD-10-CM

## 2011-10-15 DIAGNOSIS — G8111 Spastic hemiplegia affecting right dominant side: Secondary | ICD-10-CM | POA: Diagnosis present

## 2011-10-15 DIAGNOSIS — M5430 Sciatica, unspecified side: Secondary | ICD-10-CM | POA: Diagnosis present

## 2011-10-15 DIAGNOSIS — R209 Unspecified disturbances of skin sensation: Secondary | ICD-10-CM | POA: Diagnosis present

## 2011-10-15 DIAGNOSIS — IMO0002 Reserved for concepts with insufficient information to code with codable children: Secondary | ICD-10-CM | POA: Diagnosis present

## 2011-10-15 DIAGNOSIS — R4182 Altered mental status, unspecified: Principal | ICD-10-CM | POA: Diagnosis not present

## 2011-10-15 DIAGNOSIS — E119 Type 2 diabetes mellitus without complications: Secondary | ICD-10-CM | POA: Diagnosis present

## 2011-10-15 DIAGNOSIS — R1013 Epigastric pain: Secondary | ICD-10-CM

## 2011-10-15 DIAGNOSIS — G8110 Spastic hemiplegia affecting unspecified side: Secondary | ICD-10-CM | POA: Diagnosis present

## 2011-10-15 DIAGNOSIS — R1012 Left upper quadrant pain: Secondary | ICD-10-CM | POA: Diagnosis present

## 2011-10-15 LAB — CBC WITH DIFFERENTIAL/PLATELET
Basophils Relative: 0 % (ref 0–1)
Hemoglobin: 11 g/dL — ABNORMAL LOW (ref 12.0–15.0)
Lymphs Abs: 1.7 10*3/uL (ref 0.7–4.0)
Monocytes Relative: 4 % (ref 3–12)
Neutro Abs: 8.4 10*3/uL — ABNORMAL HIGH (ref 1.7–7.7)
Neutrophils Relative %: 67 % (ref 43–77)
RBC: 4.18 MIL/uL (ref 3.87–5.11)

## 2011-10-15 LAB — COMPREHENSIVE METABOLIC PANEL
ALT: 11 U/L (ref 0–35)
Albumin: 3.1 g/dL — ABNORMAL LOW (ref 3.5–5.2)
Alkaline Phosphatase: 71 U/L (ref 39–117)
BUN: 14 mg/dL (ref 6–23)
Chloride: 102 mEq/L (ref 96–112)
Glucose, Bld: 235 mg/dL — ABNORMAL HIGH (ref 70–99)
Potassium: 4.3 mEq/L (ref 3.5–5.1)
Total Bilirubin: 0.2 mg/dL — ABNORMAL LOW (ref 0.3–1.2)

## 2011-10-15 LAB — LIPASE, BLOOD: Lipase: 34 U/L (ref 11–59)

## 2011-10-15 LAB — GLUCOSE, CAPILLARY: Glucose-Capillary: 190 mg/dL — ABNORMAL HIGH (ref 70–99)

## 2011-10-15 MED ORDER — MORPHINE SULFATE 2 MG/ML IJ SOLN
2.0000 mg | INTRAMUSCULAR | Status: DC | PRN
  Administered 2011-10-15 – 2011-10-16 (×3): 2 mg via INTRAVENOUS
  Filled 2011-10-15 (×4): qty 1

## 2011-10-15 MED ORDER — SODIUM CHLORIDE 0.9 % IV SOLN
1.0000 g | Freq: Every day | INTRAVENOUS | Status: DC
  Administered 2011-10-15: 1 g via INTRAVENOUS
  Filled 2011-10-15: qty 1

## 2011-10-15 MED ORDER — ONDANSETRON HCL 4 MG/2ML IJ SOLN
4.0000 mg | Freq: Four times a day (QID) | INTRAMUSCULAR | Status: DC | PRN
  Administered 2011-10-17: 4 mg via INTRAVENOUS
  Filled 2011-10-15 (×2): qty 2

## 2011-10-15 MED ORDER — KETOROLAC TROMETHAMINE 15 MG/ML IJ SOLN
15.0000 mg | Freq: Four times a day (QID) | INTRAMUSCULAR | Status: DC | PRN
  Administered 2011-10-15: 15 mg via INTRAVENOUS
  Filled 2011-10-15: qty 1

## 2011-10-15 MED ORDER — SODIUM CHLORIDE 0.9 % IV SOLN
INTRAVENOUS | Status: DC
  Administered 2011-10-16 – 2011-10-17 (×3): via INTRAVENOUS

## 2011-10-15 MED ORDER — OXYCODONE HCL 5 MG PO TABS
5.0000 mg | ORAL_TABLET | ORAL | Status: DC | PRN
  Administered 2011-10-15: 5 mg via ORAL
  Filled 2011-10-15: qty 1

## 2011-10-15 MED ORDER — ACETAMINOPHEN 650 MG RE SUPP: 650.0000 mg | Freq: Four times a day (QID) | RECTAL | Status: DC | PRN

## 2011-10-15 MED ORDER — ACETAMINOPHEN 325 MG PO TABS
650.0000 mg | ORAL_TABLET | Freq: Four times a day (QID) | ORAL | Status: DC | PRN
  Administered 2011-10-15: 650 mg via ORAL
  Filled 2011-10-15: qty 2

## 2011-10-15 MED ORDER — SODIUM CHLORIDE 0.9 % IV BOLUS (SEPSIS): 500.0000 mL | Freq: Once | INTRAVENOUS | Status: DC

## 2011-10-15 MED ORDER — TIZANIDINE HCL 4 MG PO TABS
4.0000 mg | ORAL_TABLET | Freq: Every day | ORAL | Status: DC
  Administered 2011-10-15: 4 mg via ORAL
  Filled 2011-10-15 (×2): qty 1

## 2011-10-15 MED ORDER — PANTOPRAZOLE SODIUM 40 MG IV SOLR
40.0000 mg | Freq: Every day | INTRAVENOUS | Status: DC
  Administered 2011-10-15: 40 mg via INTRAVENOUS
  Filled 2011-10-15 (×2): qty 40

## 2011-10-15 MED ORDER — PROMETHAZINE HCL 25 MG/ML IJ SOLN: 12.5000 mg | Freq: Four times a day (QID) | INTRAMUSCULAR | Status: DC | PRN

## 2011-10-15 MED ORDER — GABAPENTIN 300 MG PO CAPS
300.0000 mg | ORAL_CAPSULE | Freq: Three times a day (TID) | ORAL | Status: DC
  Administered 2011-10-15: 300 mg via ORAL
  Filled 2011-10-15 (×5): qty 1

## 2011-10-15 MED ORDER — INSULIN ASPART 100 UNIT/ML ~~LOC~~ SOLN
0.0000 [IU] | Freq: Three times a day (TID) | SUBCUTANEOUS | Status: DC
  Administered 2011-10-16: 5 [IU] via SUBCUTANEOUS
  Administered 2011-10-16 (×2): 3 [IU] via SUBCUTANEOUS
  Administered 2011-10-17: 8 [IU] via SUBCUTANEOUS
  Administered 2011-10-17: 5 [IU] via SUBCUTANEOUS
  Administered 2011-10-17: 3 [IU] via SUBCUTANEOUS

## 2011-10-15 NOTE — ED Provider Notes (Signed)
History     CSN: [id number]  Arrival date & time 10/14/11  2003   First MD Initiated Contact with Patient 10/14/11 2328      Chief Complaint  Patient presents with  . Headache    (Consider location/radiation/quality/duration/timing/severity/associated sxs/prior treatment) HPI 41 year old female presents to emergency department complaining of pressure in her head. She has a complicated past medical history with history of prior history of stroke in 2011. Patient or reports she's been having a feeling of a balloon blowing up in her head and then releasing ongoing for the last 6 months usually about once a month. She has told her neurologist about these symptoms, and he remarked that it may be a migraine. Since Thursday, when she was discharged from the hospital after having a abdominal hernia repair, she's been having increasing episodes of this sensation. Patient also describes it as a fire work going off in her head. She denies any pain, she denies any focal weakness numbness or other new neurologic symptoms. She reports having upwards of 15 episodes of the feeling of a balloon blowing up in her head. This sensation lasts 40-60 seconds and then resolves. Today around 4 PM she became shaky and dizzy with standing and she had one of these episodes and fell, she is unsure if she completely passed out although she remembers falling and landing on her left side. She denies striking her head.  She was nauseated after this episode. She was told by home health nurse that she should come to the hospital for evaluation for another stroke. Patient reports she started a new medication, Geodon, on Saturday and has noted increasing symptoms since starting that medicine.  Past Medical History  Diagnosis Date  . Flaccid hemiplegia affecting dominant side   . Enthesopathy of hip region     RIGHT HIP PAIN WITH PAIN DOWN RIGHT LEG  . Lumbago     LOWER BACK  . Spastic hemiplegia affecting dominant side   .  Panic disorder without agoraphobia   . Depression   . Anxiety   . Diabetes mellitus   . Hypertension   . Blood transfusion     IN 2012  AFTER C -SECTION  . Cerebral thrombosis with cerebral infarction JUNE 2011    RIGHT SIDED WEAKNESS ( ARM AND LEG ) AND SPASMS  . Stroke   . Right rotator cuff tear     PAIN IN RIGHT SHOULDER  . Ventral hernia     RIGHT LOWER QUADRANT-CAUSING SOME PAIN  . Cough     STARTED 09/24/11--NO OTHER SYMPTOMS.  Marland Kitchen Anemia     DURING MENSES--HAS HEAVY BLEEDING WITH PERODS  . Headache     MIGRAINES--NOT REALLY HEADACHE-MORE LIKE PRESSURE SENSATION IN HEAD-FEELS DIZZIY AND  FAINT AS THE PRESSURE RESOLVES  . Diabetic neuropathy     BOTH FEET --COMES AND GOES  . Restless leg syndrome     DIAGNOSED BY SLEEP STUDY - PT TOLD SHE DID NOT HAVE SLEEP APNEA  . Rash     HANDS, ARMS --STATES HX OF RASH EVER SINCE CHILDBIRTH/PREGNANCY.  STATES THE RASH OFTEN OCCURS WHEN SHE IS REALLY STRESSED.    Past Surgical History  Procedure Date  . Cesarean section 2012  . Uterine fibroid surgery     2 SURGERIES FOR FIBROIDS  . Ureter revision   . Ventral hernia repair 10/03/2011    Procedure: LAPAROSCOPIC VENTRAL HERNIA;  Surgeon: Ardeth Sportsman, MD;  Location: [facility];  Service: General;  Laterality: N/A;  Family History  Problem Relation Age of Onset  . Diabetes Father   . Kidney disease Father   . Other Neg Hx   . Diabetes Maternal Grandmother   . Hyperlipidemia Paternal Grandmother   . Stroke Paternal Grandmother     History  Substance Use Topics  . Smoking status: Never Smoker   . Smokeless tobacco: Never Used  . Alcohol Use: No    OB History    Grav Para Term Preterm Abortions TAB SAB Ect Mult Living   1 1 0 1 0 0 0 0 0 1       Review of Systems  All other systems reviewed and are negative.    Allergies  Contrast media; Iohexol; Midazolam hcl; Shellfish allergy; Avandia; Kiwi extract; Metformin and related; and Other  Home Medications   Current  Outpatient Rx  Name Route Sig Dispense Refill  . ASPIRIN 325 MG PO TABS Oral Take 325 mg by mouth daily.     Marland Kitchen EXFORGE HCT 10-320-25 MG PO TABS Oral Take 1 tablet by mouth every evening.     . FUROSEMIDE 40 MG PO TABS Oral Take 20-40 mg by mouth daily.    Marland Kitchen GABAPENTIN 300 MG PO CAPS Oral Take 300 mg by mouth 3 (three) times daily.    . INSULIN GLARGINE 100 UNIT/ML [place] SOLN Subcutaneous Inject 20-22 Units into the skin 2 (two) times daily. Inject 22 units in the morning and 20 units in the evening    . INSULIN LISPRO (HUMAN) 100 UNIT/ML [place] SOLN Subcutaneous Inject 6-10 Units into the skin 3 (three) times daily before meals. Pt on sliding scale    . ADULT MULTIVITAMIN W/MINERALS CH Oral Take 1 tablet by mouth daily.    Marland Kitchen PROMETHAZINE HCL 12.5 MG PO TABS Oral Take 12.5 mg by mouth every 6 (six) hours as needed. For nausea    . TIZANIDINE HCL 4 MG PO CAPS Oral Take 4 mg by mouth at bedtime.    . TRAMADOL HCL 50 MG PO TABS Oral Take 50-100 mg by mouth every 6 (six) hours as needed. For pain      BP 145/76  Pulse 95  Temp 98.2 F (36.8 C) (Oral)  Resp 18  SpO2 100%  LMP 09/11/2011  Physical Exam  Nursing note and vitals reviewed. Constitutional: She is oriented to person, place, and time. She appears well-developed and well-nourished.  HENT:  Head: Normocephalic and atraumatic.  Right Ear: External ear normal.  Left Ear: External ear normal.  Nose: Nose normal.  Mouth/Throat: Oropharynx is clear and moist.       Fluid noted behind both tms does not appear inflamed or infected  Eyes: Conjunctivae normal and EOM are normal. Pupils are equal, round, and reactive to light.  Neck: Normal range of motion. Neck supple. No JVD present. No tracheal deviation present. No thyromegaly present.  Cardiovascular: Normal rate, regular rhythm, normal heart sounds and intact distal pulses.  Exam reveals no gallop and no friction rub.   No murmur heard. Pulmonary/Chest: Effort normal and breath sounds  normal. No stridor. No respiratory distress. She has no wheezes. She has no rales. She exhibits no tenderness.  Abdominal: Soft. Bowel sounds are normal. She exhibits no distension and no mass. There is no tenderness. There is no rebound and no guarding.  Musculoskeletal: Normal range of motion. She exhibits no edema and no tenderness.  Lymphadenopathy:    She has no cervical adenopathy.  Neurological: She is alert and oriented to person,  place, and time. She has normal reflexes. No cranial nerve deficit. She exhibits normal muscle tone. Coordination normal.  Skin: Skin is warm and dry. No rash noted. No erythema. No pallor.  Psychiatric: She has a normal mood and affect. Her behavior is normal. Judgment and thought content normal.    ED Course  Procedures (including critical care time)  Labs Reviewed  URINALYSIS, ROUTINE W REFLEX MICROSCOPIC - Abnormal; Notable for the following:    APPearance CLOUDY (*)     Hgb urine dipstick SMALL (*)     Protein, ur 30 (*)     All other components within normal limits  CBC WITH DIFFERENTIAL - Abnormal; Notable for the following:    WBC 11.5 (*)     Hemoglobin 10.6 (*)     HCT 32.2 (*)     Platelets 420 (*)     Neutro Abs 8.5 (*)     All other components within normal limits  COMPREHENSIVE METABOLIC PANEL - Abnormal; Notable for the following:    Glucose, Bld 205 (*)     Creatinine, Ser 1.15 (*)     Albumin 3.1 (*)     Total Bilirubin 0.2 (*)     GFR calc non Af Amer 58 (*)     GFR calc Af Amer 67 (*)     All other components within normal limits  URINE MICROSCOPIC-ADD ON - Abnormal; Notable for the following:    Squamous Epithelial / LPF MANY (*)     All other components within normal limits  PREGNANCY, URINE  LAB REPORT - SCANNED   No results found.   1. Headache       MDM  41 year old female with sensation of pressure in her head that suddenly releases.She has a normal exam here. I discussed the case with on-call neurologist Dr.  Thad Ranger who cannot attribute this to a specific syndrome. She recommends followup with Dr. Pearlean Brownie, patient's neurologist and stopping Geodon as this may be an odd side effect.        Olivia Mackie, MD 10/15/11 [phone]

## 2011-10-15 NOTE — Progress Notes (Signed)
Patient ID: Tina [name], female   DOB: 04/28/1970, 41 y.o.   MRN: [id number]  Chief Complaint  Patient presents with  . Pain    pai post hernia repair on 9/20    HPI Tina [name] is a 41 y.o. female.   HPI This is a 41 year old female with several medical problems who underwent a laparoscopic ventral hernia repair with Dr. Michaell Cowing on September 20. She did do pretty well and her pain at all been improving. She just been on Ultram at this point. In the last 24-hour she had an acute onset of mostly epigastric and upper abdominal pain. This occurred after a fall from some of her medication she thinks for headache. Since then she felt an acute tearing sensation in her upper abdomen and now has significant pain. She states that she has been nauseated and has been vomiting. However she is passing flatus and has been having bowel movements. This pain is causing her to have a lot of difficulty even moving right now. It is very painful for her to be in the car or even move around the exam room today. Past Medical History  Diagnosis Date  . Flaccid hemiplegia affecting dominant side   . Enthesopathy of hip region     RIGHT HIP PAIN WITH PAIN DOWN RIGHT LEG  . Lumbago     LOWER BACK  . Spastic hemiplegia affecting dominant side   . Panic disorder without agoraphobia   . Depression   . Anxiety   . Diabetes mellitus   . Hypertension   . Blood transfusion     IN 2012  AFTER C -SECTION  . Cerebral thrombosis with cerebral infarction JUNE 2011    RIGHT SIDED WEAKNESS ( ARM AND LEG ) AND SPASMS  . Stroke   . Right rotator cuff tear     PAIN IN RIGHT SHOULDER  . Ventral hernia     RIGHT LOWER QUADRANT-CAUSING SOME PAIN  . Cough     STARTED 09/24/11--NO OTHER SYMPTOMS.  Marland Kitchen Anemia     DURING MENSES--HAS HEAVY BLEEDING WITH PERODS  . Headache     MIGRAINES--NOT REALLY HEADACHE-MORE LIKE PRESSURE SENSATION IN HEAD-FEELS DIZZIY AND  FAINT AS THE PRESSURE RESOLVES  . Diabetic neuropathy    BOTH FEET --COMES AND GOES  . Restless leg syndrome     DIAGNOSED BY SLEEP STUDY - PT TOLD SHE DID NOT HAVE SLEEP APNEA  . Rash     HANDS, ARMS --STATES HX OF RASH EVER SINCE CHILDBIRTH/PREGNANCY.  STATES THE RASH OFTEN OCCURS WHEN SHE IS REALLY STRESSED.    Past Surgical History  Procedure Date  . Cesarean section 2012  . Uterine fibroid surgery     2 SURGERIES FOR FIBROIDS  . Ureter revision   . Ventral hernia repair 10/03/2011    Procedure: LAPAROSCOPIC VENTRAL HERNIA;  Surgeon: Ardeth Sportsman, MD;  Location: [facility];  Service: General;  Laterality: N/A;    Family History  Problem Relation Age of Onset  . Diabetes Father   . Kidney disease Father   . Other Neg Hx   . Diabetes Maternal Grandmother   . Hyperlipidemia Paternal Grandmother   . Stroke Paternal Grandmother     Social History History  Substance Use Topics  . Smoking status: Never Smoker   . Smokeless tobacco: Never Used  . Alcohol Use: No    Allergies  Allergen Reactions  . Contrast Media (Iodinated Diagnostic Agents) Other (See Comments)    Difficulty breathing  .  Iohexol Hives, Nausea And Vomiting and Swelling     Desc: Magnevist-gadolinium-difficulty breathing, throat swelling   . Midazolam Hcl Other (See Comments)    Difficulty breathing  . Shellfish Allergy Anaphylaxis  . Avandia (Rosiglitazone Maleate)     Patient doesn't remember reaction  . Kiwi Extract Itching and Swelling  . Metformin And Related Nausea And Vomiting and Other (See Comments)    diarrhea  . Other Itching    Patient is allergic to all nuts except peanuts.     Current Outpatient Prescriptions  Medication Sig Dispense Refill  . aspirin 325 MG tablet Take 325 mg by mouth daily.       Marland Kitchen EXFORGE HCT 10-320-25 MG TABS Take 1 tablet by mouth every evening.       . furosemide (LASIX) 40 MG tablet Take 20-40 mg by mouth daily.      Marland Kitchen gabapentin (NEURONTIN) 300 MG capsule Take 300 mg by mouth 3 (three) times daily.      . insulin  glargine (LANTUS) 100 UNIT/ML injection Inject 20-22 Units into the skin 2 (two) times daily. Inject 22 units in the morning and 20 units in the evening      . insulin lispro (HUMALOG) 100 UNIT/ML injection Inject 6-10 Units into the skin 3 (three) times daily before meals. Pt on sliding scale      . Multiple Vitamin (MULTIVITAMIN WITH MINERALS) TABS Take 1 tablet by mouth daily.      . promethazine (PHENERGAN) 12.5 MG tablet Take 12.5 mg by mouth every 6 (six) hours as needed. For nausea      . tiZANidine (ZANAFLEX) 4 MG capsule Take 4 mg by mouth at bedtime.      . traMADol (ULTRAM) 50 MG tablet Take 50-100 mg by mouth every 6 (six) hours as needed. For pain        Review of Systems Review of Systems  Blood pressure 198/114, pulse 104, temperature 98.2 F (36.8 C), temperature source Temporal, resp. rate 20, height 5' 6.5" (1.689 m), weight 244 lb 3.2 oz (110.768 kg), last menstrual period 09/11/2011.  Physical Exam Physical Exam  Vitals reviewed. Constitutional: She appears distressed.  Cardiovascular: Tachycardia present.   Pulmonary/Chest: Effort normal.  Abdominal: Normal appearance. There is tenderness in the epigastric area.    Skin: She is diaphoretic.    Data Reviewed Prior op note and notes from Dr. Michaell Cowing  Assessment    Abdominal pain s/p lap ventral hernia repair    Plan    I'm concerned about her due to the degree of her pain and her appearance today. She clearly is very uncomfortable. She is afebrile here but she states she she did have a temperature to 101 at home. She is nauseated and vomiting she states also. She is mildly tachycardic to date. I'm going to admit her to the hospital. Verlon Au going to check some labs and I will also obtain a CT scan to ensure there is no complication from her hernia repair. That may all be okay she just needs pain control. I discussed this plan with she and her husband.       Elis Rawlinson 10/15/2011, 4:01 PM

## 2011-10-15 NOTE — Progress Notes (Signed)
Received a call from Dr. Molli Posey in radiology.  He think that this is a likely mesh infection.  He said that the bowel looks normal without any sign of edema or thickening or sign of obstruction or incarceration.  This could be a seroma or hematoma as well.  Check blood cultures and start antibiotics.

## 2011-10-16 ENCOUNTER — Encounter (HOSPITAL_COMMUNITY): Payer: Self-pay | Admitting: *Deleted

## 2011-10-16 ENCOUNTER — Inpatient Hospital Stay (HOSPITAL_COMMUNITY): Payer: Federal, State, Local not specified - PPO

## 2011-10-16 DIAGNOSIS — D72829 Elevated white blood cell count, unspecified: Secondary | ICD-10-CM | POA: Diagnosis not present

## 2011-10-16 DIAGNOSIS — E1165 Type 2 diabetes mellitus with hyperglycemia: Secondary | ICD-10-CM | POA: Diagnosis present

## 2011-10-16 DIAGNOSIS — R1012 Left upper quadrant pain: Secondary | ICD-10-CM | POA: Diagnosis present

## 2011-10-16 DIAGNOSIS — IMO0002 Reserved for concepts with insufficient information to code with codable children: Secondary | ICD-10-CM | POA: Diagnosis present

## 2011-10-16 DIAGNOSIS — R4182 Altered mental status, unspecified: Principal | ICD-10-CM | POA: Diagnosis not present

## 2011-10-16 LAB — COMPREHENSIVE METABOLIC PANEL
ALT: 9 U/L (ref 0–35)
AST: 10 U/L (ref 0–37)
Albumin: 2.7 g/dL — ABNORMAL LOW (ref 3.5–5.2)
Alkaline Phosphatase: 59 U/L (ref 39–117)
Glucose, Bld: 204 mg/dL — ABNORMAL HIGH (ref 70–99)
Potassium: 3.8 mEq/L (ref 3.5–5.1)
Sodium: 134 mEq/L — ABNORMAL LOW (ref 135–145)
Total Protein: 6.5 g/dL (ref 6.0–8.3)

## 2011-10-16 LAB — BLOOD GAS, ARTERIAL
Acid-base deficit: 0.5 mmol/L (ref 0.0–2.0)
TCO2: 20.7 mmol/L (ref 0–100)
pCO2 arterial: 32.1 mmHg — ABNORMAL LOW (ref 35.0–45.0)
pO2, Arterial: 91.6 mmHg (ref 80.0–100.0)

## 2011-10-16 LAB — GLUCOSE, CAPILLARY
Glucose-Capillary: 182 mg/dL — ABNORMAL HIGH (ref 70–99)
Glucose-Capillary: 162 mg/dL — ABNORMAL HIGH (ref 70–99)

## 2011-10-16 LAB — CBC
Hemoglobin: 10 g/dL — ABNORMAL LOW (ref 12.0–15.0)
MCHC: 33.4 g/dL (ref 30.0–36.0)
Platelets: 369 10*3/uL (ref 150–400)
RDW: 14.3 % (ref 11.5–15.5)

## 2011-10-16 MED ORDER — PSYLLIUM 95 % PO PACK
1.0000 | PACK | Freq: Two times a day (BID) | ORAL | Status: DC
  Administered 2011-10-16 (×2): 1 via ORAL
  Filled 2011-10-16 (×5): qty 1

## 2011-10-16 MED ORDER — PROMETHAZINE HCL 25 MG/ML IJ SOLN: 12.5000 mg | Freq: Four times a day (QID) | INTRAMUSCULAR | Status: DC | PRN

## 2011-10-16 MED ORDER — LIP MEDEX EX OINT
1.0000 "application " | TOPICAL_OINTMENT | Freq: Two times a day (BID) | CUTANEOUS | Status: DC
  Administered 2011-10-16 – 2011-10-18 (×6): 1 via TOPICAL
  Filled 2011-10-16: qty 7

## 2011-10-16 MED ORDER — INSULIN GLARGINE 100 UNIT/ML ~~LOC~~ SOLN
11.0000 [IU] | Freq: Every day | SUBCUTANEOUS | Status: DC
  Administered 2011-10-17 – 2011-10-18 (×2): 11 [IU] via SUBCUTANEOUS

## 2011-10-16 MED ORDER — GABAPENTIN 400 MG PO CAPS
400.0000 mg | ORAL_CAPSULE | Freq: Three times a day (TID) | ORAL | Status: DC
  Administered 2011-10-16 – 2011-10-17 (×4): 400 mg via ORAL
  Filled 2011-10-16 (×6): qty 1

## 2011-10-16 MED ORDER — IRBESARTAN 300 MG PO TABS
300.0000 mg | ORAL_TABLET | Freq: Every day | ORAL | Status: DC
  Filled 2011-10-16: qty 1

## 2011-10-16 MED ORDER — LACTATED RINGERS IV BOLUS (SEPSIS): 1000.0000 mL | Freq: Three times a day (TID) | INTRAVENOUS | Status: AC | PRN

## 2011-10-16 MED ORDER — MAGIC MOUTHWASH
15.0000 mL | Freq: Four times a day (QID) | ORAL | Status: DC | PRN
  Filled 2011-10-16: qty 15

## 2011-10-16 MED ORDER — LIP MEDEX EX OINT
TOPICAL_OINTMENT | CUTANEOUS | Status: AC
  Administered 2011-10-16: 08:00:00
  Filled 2011-10-16: qty 7

## 2011-10-16 MED ORDER — DIPHENHYDRAMINE HCL 50 MG/ML IJ SOLN: 12.5000 mg | Freq: Four times a day (QID) | INTRAMUSCULAR | Status: DC | PRN

## 2011-10-16 MED ORDER — ASPIRIN 81 MG PO CHEW
81.0000 mg | CHEWABLE_TABLET | Freq: Every day | ORAL | Status: DC
  Administered 2011-10-16 – 2011-10-18 (×3): 81 mg via ORAL
  Filled 2011-10-16 (×4): qty 1

## 2011-10-16 MED ORDER — HYDROCODONE-ACETAMINOPHEN 5-325 MG PO TABS: 1.0000 | ORAL_TABLET | ORAL | Status: DC | PRN

## 2011-10-16 MED ORDER — MORPHINE SULFATE 2 MG/ML IJ SOLN
2.0000 mg | INTRAMUSCULAR | Status: DC | PRN
  Administered 2011-10-16 – 2011-10-17 (×2): 2 mg via INTRAVENOUS
  Filled 2011-10-16: qty 1

## 2011-10-16 MED ORDER — BISACODYL 10 MG RE SUPP: 10.0000 mg | Freq: Two times a day (BID) | RECTAL | Status: DC | PRN

## 2011-10-16 MED ORDER — AMLODIPINE BESYLATE 10 MG PO TABS
10.0000 mg | ORAL_TABLET | Freq: Every day | ORAL | Status: DC
  Administered 2011-10-16: 10 mg via ORAL
  Filled 2011-10-16 (×3): qty 1

## 2011-10-16 MED ORDER — TRAMADOL HCL 50 MG PO TABS
50.0000 mg | ORAL_TABLET | Freq: Once | ORAL | Status: AC
  Administered 2011-10-16: 50 mg via ORAL
  Filled 2011-10-16: qty 1

## 2011-10-16 MED ORDER — TIZANIDINE HCL 4 MG PO TABS
8.0000 mg | ORAL_TABLET | Freq: Every day | ORAL | Status: DC
  Filled 2011-10-16: qty 2

## 2011-10-16 MED ORDER — ALUM & MAG HYDROXIDE-SIMETH 200-200-20 MG/5ML PO SUSP: 30.0000 mL | Freq: Four times a day (QID) | ORAL | Status: DC | PRN

## 2011-10-16 MED ORDER — INSULIN GLARGINE 100 UNIT/ML ~~LOC~~ SOLN
10.0000 [IU] | Freq: Every day | SUBCUTANEOUS | Status: DC
  Administered 2011-10-16 – 2011-10-18 (×3): 10 [IU] via SUBCUTANEOUS

## 2011-10-16 MED ORDER — KETOROLAC TROMETHAMINE 15 MG/ML IJ SOLN
30.0000 mg | Freq: Four times a day (QID) | INTRAMUSCULAR | Status: DC
  Administered 2011-10-16 – 2011-10-17 (×3): 30 mg via INTRAVENOUS
  Filled 2011-10-16 (×2): qty 2
  Filled 2011-10-16 (×7): qty 1

## 2011-10-16 MED ORDER — OXYCODONE HCL 5 MG PO TABS
5.0000 mg | ORAL_TABLET | ORAL | Status: DC | PRN
  Administered 2011-10-16 (×2): 10 mg via ORAL
  Filled 2011-10-16 (×2): qty 2

## 2011-10-16 NOTE — Progress Notes (Signed)
RN in to check on pt. Pt having snoring respirations. Pt did not arouse to verbal or tactile stimuli. Aroused via sternal rub. Took about a minute to respond appropriately. Vitals obtained. BP 143/69 HR RR 24  Temp 98.6  O2 sat 100% on RA. Trembling and weak as previously in shift. Dizziness reported yet denied pain. Report given to night shift RN.

## 2011-10-16 NOTE — Progress Notes (Signed)
Margarethe RYLIEE FIGGE [id number] [birth year]   Subjective:  Sore in LUQ at 10mm port site - only place No nausea now Spasming pain with laugh/walking Right pinky toe numbness much less.  Objective:  Vital signs:  Filed Vitals:   10/15/11 1800 10/15/11 1840 10/15/11 2235 10/16/11 0458  BP: 156/88  141/76 122/82  Pulse:   94 78  Temp:   98.7 F (37.1 C) 97.6 F (36.4 C)  TempSrc:   Oral Oral  Resp:   18 18  Height:  5' 6.5" (1.689 m)    Weight:  245 lb (111.131 kg)    SpO2:   100% 100%    Last BM Date: 10/15/11  Intake/Output   Yesterday:  10/02 0701 - 10/03 0700 In: 0  Out: 200 [Urine:200] This shift:  Total I/O In: -  Out: 200 [Urine:200]  Bowel function:  Flatus: y  BM: n  Physical Exam:  General: Pt awake/alert/oriented x4 in mild acute distress, not toxic just uncomfortable Eyes: PERRL, normal EOM.  Sclera clear.  No icterus Neuro: CN II-XII intact w/o focal sensory/motor deficits. Lymph: No head/neck/groin lymphadenopathy Psych:  No delerium/psychosis/paranoia HENT: Normocephalic, Mucus membranes moist.  No thrush Neck: Supple, No tracheal deviation Chest: No chest wall pain w good excursion CV:  Pulses intact.  Regular rhythm Abdomen: Soft.  Nondistended.  Mod tender at LUQ 10mm port site only.  No incarcerated hernias.  No cellulitis.  Obese Ext:  SCDs BLE.  No mjr edema.  No cyanosis Skin: No petechiae / purpurae  Problem List:  Active Problems:  * No active hospital problems. *    Assessment  Lenette DESTINIE THORNSBERRY  41 y.o. female       Abd pain most likely from severe pull s/p fall.  No evid infection/SBO/perforation/acute abdomen  Plan:  -increase pain control -antiinflammatory regimen -double her home muscle relaxant -PT/OT eval for balance -bowel regimen & retry PO   -VTE prophylaxis- SCDs, etc -mobilize as tolerated to help recovery  Ardeth Sportsman, M.D., F.A.C.S. [facility] [address], [address] [place], [place] [address] [phone] Main / Paging [phone] Voice Mail   10/16/2011  CARE TEAM:  PCP: Michiel Sites, MD  Outpatient Care Team: Patient Care Team: Michiel Sites, MD as PCP - General Serita Kyle, MD as Consulting Physician (Obstetrics and Gynecology)  Inpatient Treatment Team: Treatment Team: Attending Provider: Ardeth Sportsman, MD; Registered Nurse: Edythe Clarity, RN; Registered Nurse: Patsey Berthold, RN   Results:   Labs: Results for orders placed during the hospital encounter of 10/15/11 (from the past 48 hour(s))  CBC WITH DIFFERENTIAL     Status: Abnormal   Collection Time   10/15/11  5:53 PM      Component Value Range Comment   WBC 12.6 (*) 4.0 - 10.5 K/uL    RBC 4.18  3.87 - 5.11 MIL/uL    Hemoglobin 11.0 (*) 12.0 - 15.0 g/dL    HCT 95.2 (*) 84.1 - 46.0 %    MCV 78.9  78.0 - 100.0 fL    MCH 26.3  26.0 - 34.0 pg    MCHC 33.3  30.0 - 36.0 g/dL    RDW 32.4  40.1 - 02.7 %    Platelets 408 (*) 150 - 400 K/uL    Neutrophils Relative 67  43 - 77 %    Neutro Abs 8.4 (*) 1.7 - 7.7 K/uL    Lymphocytes  Relative 14  12 - 46 %    Lymphs Abs 1.7  0.7 - 4.0 K/uL    Monocytes Relative 4  3 - 12 %    Monocytes Absolute 0.5  0.1 - 1.0 K/uL    Eosinophils Relative 16 (*) 0 - 5 %    Eosinophils Absolute 2.0 (*) 0.0 - 0.7 K/uL    Basophils Relative 0  0 - 1 %    Basophils Absolute 0.0  0.0 - 0.1 K/uL   COMPREHENSIVE METABOLIC PANEL     Status: Abnormal   Collection Time   10/15/11  5:53 PM      Component Value Range Comment   Sodium 136  135 - 145 mEq/L    Potassium 4.3  3.5 - 5.1 mEq/L    Chloride 102  96 - 112 mEq/L    CO2 22  19 - 32 mEq/L    Glucose, Bld 235 (*) 70 - 99 mg/dL    BUN 14  6 - 23 mg/dL    Creatinine, Ser 1.61  0.50 - 1.10 mg/dL    Calcium 9.5  8.4 - 09.6 mg/dL    Total Protein 7.6  6.0 - 8.3 g/dL    Albumin 3.1 (*) 3.5 - 5.2 g/dL    AST 11  0  - 37 U/L    ALT 11  0 - 35 U/L    Alkaline Phosphatase 71  39 - 117 U/L    Total Bilirubin 0.2 (*) 0.3 - 1.2 mg/dL    GFR calc non Af Amer 90 (*) >90 mL/min    GFR calc Af Amer >90  >90 mL/min   LIPASE, BLOOD     Status: Normal   Collection Time   10/15/11  5:53 PM      Component Value Range Comment   Lipase 34  11 - 59 U/L   GLUCOSE, CAPILLARY     Status: Abnormal   Collection Time   10/15/11 10:31 PM      Component Value Range Comment   Glucose-Capillary 190 (*) 70 - 99 mg/dL     Imaging / Studies: Ct Abdomen Pelvis Wo Contrast  10/15/2011  *RADIOLOGY REPORT*  Clinical Data: Upper abdominal pain with nausea vomiting.  Status post left.  The ventral hernia repair on 10/03/2011.  CT ABDOMEN AND PELVIS WITHOUT CONTRAST  Technique:  Multidetector CT imaging of the abdomen and pelvis was performed following the standard protocol without intravenous contrast.  Comparison: 07/30/2011  Findings: Small area of posterior airspace disease in the right base suggests pneumonia.  The liver measures 21.2 cm in cranial caudal length.  Liver and spleen show no focal abnormality on the study performed without intravenous contrast material.  The stomach, duodenum, pancreas, gallbladder, and adrenal glands are unremarkable.  Left kidney is atrophic with multiple areas of scarring.  Right kidney has unremarkable appearance.  No abdominal aortic aneurysm.  There is a trace amount of fluid anterior to the liver.  Imaging through the pelvis shows the patient to be status post ventral hernia repair with mesh placement.  Assessment of the ventral abdominal wall is limited by the lack of intravenous contrast material.  However, a 6.6 x 3.7 cm fluid collection is seen superficial to the rectus fascia about 11 mm caudal to the umbilicus and just anterior to the right iliac crest.  This fluid appears to track down into the right aspect of the  right rectus sheath inferiorly.  There is some fluid anterior to the opacified,  nondilated small bowel loops in the anterior right lower quadrant. While the axial images suggest that some of this fluid is immediately adjacent/around the small bowel loops, the sagittal re- formations suggest that most of the fluid is actually anterior to the small bowel loops and I suspect that it is extraperitoneal but that there may be some laxity in either the mesh or the peritoneum in this region which causes the appearance on the axial images.  If this was truly intraperitoneal fluid, I would expect some free flowing fluid elsewhere in the peritoneal cavity, but there is none.  Diffuse edema is seen within the subcutaneous fat of the lower abdomen and upper pelvis.  There is no evidence for a small bowel or colonic obstruction at this time.  Fibroid changes seen in the uterus.  No evidence for an adnexal mass.  Bone windows reveal no worrisome lytic or sclerotic osseous lesions.  IMPRESSION: Fluid collection is seen along the lower right margin of the ventral mesh with a focal area of fluid projecting superficial to the rectus fascia. The the fluid collection measures essentially water density and may be a seroma or old, liquefied hematoma. Superinfection of this fluid cannot be excluded by CT, but there is no evidence for gas within it.  Axial images suggest that some of the fluid surrounds small bowel loops, but this is less evident on the sagittal re-formations and I suspect that the fluid is all extraperitoneal but the appearance on the axial images is secondary to some laxity redundancy in the inferior mass/perineum.  However, I cannot completely exclude that there is some loculated anterior intraperitoneal component to the fluid.  No definite evidence for bowel herniation.  Although there are multiple small bowel loops in close proximity to the fluid in the anterior abdominal wall, there is no evidence for bowel obstruction, bowel wall thickening, or other bowel complication at this time.  These  findings were discussed with Dr. Biagio Quint at 2003 hours on 10/15/2011.   Original Report Authenticated By: ERIC A. MANSELL, M.D.     Medications / Allergies: per chart  Antibiotics: Anti-infectives     Start     Dose/Rate Route Frequency Ordered Stop   10/15/11 2100   ertapenem (INVANZ) 1 g in sodium chloride 0.9 % 50 mL IVPB        1 g 100 mL/hr over 30 Minutes Intravenous Daily at bedtime 10/15/11 2013

## 2011-10-16 NOTE — Progress Notes (Signed)
1750 RRT called to 1533. Pt supine in bed with c/o "sleepy feeling like when you are put to sleep."  Bedside RN reports pt stood up and had a near syncopal episode. Assessment finds neuro intact, speech appropriate, VSS: BP 152-160/65-86, HR 90 NSR, RR 28, Sats 99% on RA. Pt states she is "feeling better." Dr. Abbey Chatters called informed of above. Orders received and instructed pt to not get OOB except with assistance. Total length of call approx. 30 minutes.

## 2011-10-16 NOTE — Progress Notes (Signed)
She has had multiple presyncopal type episodes while lying in bed or walking.  These are accompanied by transient weakness and rapidly resolve.  Her VS remain within normal limits.  Will check stat labs.  Will transfer her to stepdown and ask for a medical consult.

## 2011-10-16 NOTE — Progress Notes (Signed)
BP rechecked at 153/89. HR 92. Lethargic yet arousable. Very weak yet no trembling noted at this time. No acute distress. O2 at 2L applied. Dr. Abbey Chatters aware via phone of most recent activity and pt status. See new orders. RR nurse notified.

## 2011-10-16 NOTE — Care Management Note (Signed)
  Page 1 of 1   10/16/2011     1:39:12 PM   CARE MANAGEMENT NOTE 10/16/2011  Patient:  Tina [name], Tina [name]   Account Number:  [id number]  Date Initiated:  10/16/2011  Documentation initiated by:  Lorenda Ishihara  Subjective/Objective Assessment:   41 yo female admitted s/p fall resulting in abd pain at incision site. PTA lived at home with spouse.     Action/Plan:   Resume HH services with [facility]   Anticipated DC Date:  10/17/2011   Anticipated DC Plan:  HOME W HOME HEALTH SERVICES      DC Planning Services  CM consult      [facility] Choice  Resumption Of Svcs/PTA Provider   Choice offered to / List presented to:             Status of service:  In process, will continue to follow Medicare Important Message given?   (If response is "NO", the following Medicare IM given date fields will be blank) Date Medicare IM given:   Date Additional Medicare IM given:    Discharge Disposition:    Per UR Regulation:  Reviewed for med. necessity/level of care/duration of stay  If discussed at Long Length of Stay Meetings, dates discussed:    Comments:  10-16-11 Lorenda Ishihara RN CM 1100 Patient active with [facility] for RN and PT from previous hospitalization.

## 2011-10-16 NOTE — Progress Notes (Signed)
Rapid Response, RN in to see pt and assess pt. Preparations made to transfer to Step Down Unit per Dr. Maris Berger order. Report given to night shift RN and RR nurse.

## 2011-10-16 NOTE — Progress Notes (Signed)
Pt with dizzy upon taking narcotic pain medication. Called midlevel to get order for non narcotic. Awaiting call back will continue to monitor.

## 2011-10-16 NOTE — Consult Note (Addendum)
[facility] Medical Consultation  Tina [name] ZOX:[id number] DOB: Jun 30, 1970 DOA: 10/15/2011 PCP: Michiel Sites, MD   Requesting physician: Abbey Chatters Date of consultation: 10/16/11 Reason for consultation: altered mental status  Impression/Recommendations Acute mental status change -This appears to be resolved at the time of my examination. Vital signs are stable -Suspect that this may be related to her opioid use -Other considerations include complex migraine, seizure -CBG was 186 -Check ABG to rule out hypercarbia in light of obesity and opioid use -MRI brain without contrast--patient has history of pons infarct -Order EEG, urine drug screen, TSH, EKG -CBC and CMP are reviewed and stable, WBC and hemoglobin are stable -serial troponin, CXR for chest pain/pressure -Check UA and urine culture Hypertension -Restart the amlodipine and valsartan components of Exforge Diabetes mellitus type 2 -Patient normally takes Lantus 22 units am, 20 units p.m. -Restart at half doses until patient demonstrates good oral intake -Continue NovoLog sliding scale -Check hemoglobin A1c, lipids History of stroke  -Restart aspirin  Leukocytosis  -Last CBC revealed eosinophilia  -Recheck CBC with differential  -May need to reconsider infectious process if WBC continues to rise     Chief Complaint:  41 year old female with multiple medical problems including hypertension, stroke, anxiety, and diabetes mellitus was admitted to the hospital on 10/15/2011 after sustaining what appears to be a mechanical fall. Patient states that she was getting up to change her television station, and the next thing she knew she fallen to the ground. She felt like her legs gave out. The patient denied any loss of consciousness. After the fall, the patient had excruciating abdominal pain which prompted the admission. The patient's husband found her on the floor. The patient was awake, but couldn't get up due  to pain. As a result EMS was activated.  HPI:  During this hospitalization, the patient had a CT of the abdomen and pelvis on 10/15/2011 which revealed a ventral hernia repair with mesh with a superficial fluid collection that was water density. The fluid collection tracks down to the right rectus sheath. There is no gas within the fluid. It was felt that the fluid was all extra peritoneal. There was concern for initial infection, and the patient received one dose of Invanz. It has since been discontinued.  This evening the patient was found in her room to be initially trembling by the nursing assistant. Initially, the patient stated that she felt like she was "drifting off and couldn't move". On 2 separate later occasions, the patient stated that she felt groggy and knew that the nursing staff was speaking to her, but could not open her eyes or open her mouth to respond. In addition the patient states that she could not move her arms.  In addition, the patient felt like she has some chest pressure at that time which has since resolved. She states that this episode last only approximately 5 minutes. Subsequently, the patient was moved to step down unit. Rapid response was called, and her vitals have remained stable throughout these multiple episodes. She did have one episode of mild tachycardia with a heart rate of 102. The patient states that she ate 100% of her dinner tonight without any nausea or vomiting. She states that her abdominal pain is 6/10 at this time. At the time of my examination, the patient denied any dizziness, headache, visual changes, dysarthria, chest pain, shortness of breath, palpitations. She states that she has full use of all 4 extremities at this time. She was able to recognize  her husband who is at the bedside. Apparently her nurse prior to her transfer to step down, stated that she saw the patient's eyes "rolled back into her head".  Prior to admission, the patient saw a  neurologist for her mechanical fall. It was thought that her unsteadiness was distributed to recent use of Zanaflex. The patient was told to discontinue her Zanaflex by her neurologist.  Review of Systems:  Constitutional:  No weight loss, night sweats, Fevers, chills, fatigue.  Head&Eyes: No headache.  No vision loss.  No eye pain or scotoma ENT:  No Difficulty swallowing,Tooth/dental problems,Sore throat,  No ear ache, post nasal drip,  Cardio-vascular:  No chest pain, Orthopnea, PND, swelling in lower extremities,  dizziness, palpitations  GI:  No heartburn, indigestion, abdominal pain, nausea, vomiting, diarrhea, loss of appetite, hematochezia, melena Resp:  No shortness of breath with exertion or at rest. No excess mucus, no productive cough, No non-productive cough, No coughing up of blood.No change in color of mucus.No wheezing.No chest wall deformity  Skin:  No new rash.  Chronic rash on left forearm GU:  no dysuria, change in color of urine, no urgency or frequency. No flank pain.  Musculoskeletal:  No joint pain or swelling. No decreased range of motion. No back pain.  Psych:  No change in mood or affect. No depression or anxiety. Neurologic: No headache, no dysesthesia, no focal weakness, no vision loss. No syncope   Past Medical History  Diagnosis Date  . Flaccid hemiplegia affecting dominant side   . Enthesopathy of hip region     RIGHT HIP PAIN WITH PAIN DOWN RIGHT LEG  . Lumbago     LOWER BACK  . Spastic hemiplegia affecting dominant side   . Panic disorder without agoraphobia   . Depression   . Anxiety   . Diabetes mellitus   . Hypertension   . Blood transfusion     IN 2012  AFTER C -SECTION  . Cerebral thrombosis with cerebral infarction JUNE 2011    RIGHT SIDED WEAKNESS ( ARM AND LEG ) AND SPASMS  . Stroke   . Right rotator cuff tear     PAIN IN RIGHT SHOULDER  . Ventral hernia     RIGHT LOWER QUADRANT-CAUSING SOME PAIN  . Cough     STARTED  09/24/11--NO OTHER SYMPTOMS.  Marland Kitchen Anemia     DURING MENSES--HAS HEAVY BLEEDING WITH PERODS  . Headache     MIGRAINES--NOT REALLY HEADACHE-MORE LIKE PRESSURE SENSATION IN HEAD-FEELS DIZZIY AND  FAINT AS THE PRESSURE RESOLVES  . Diabetic neuropathy     BOTH FEET --COMES AND GOES  . Restless leg syndrome     DIAGNOSED BY SLEEP STUDY - PT TOLD SHE DID NOT HAVE SLEEP APNEA  . Rash     HANDS, ARMS --STATES HX OF RASH EVER SINCE CHILDBIRTH/PREGNANCY.  STATES THE RASH OFTEN OCCURS WHEN SHE IS REALLY STRESSED.   Past Surgical History  Procedure Date  . Cesarean section 2012  . Uterine fibroid surgery     2 SURGERIES FOR FIBROIDS  . Ureter revision   . Ventral hernia repair 10/03/2011    Procedure: LAPAROSCOPIC VENTRAL HERNIA;  Surgeon: Ardeth Sportsman, MD;  Location: [facility];  Service: General;  Laterality: N/A;   Social History:  reports that she has never smoked. She has never used smokeless tobacco. She reports that she does not drink alcohol or use illicit drugs.  Allergies  Allergen Reactions  . Contrast Media (Iodinated Diagnostic Agents) Other (See  Comments)    Difficulty breathing  . Iohexol Hives, Nausea And Vomiting and Swelling     Desc: Magnevist-gadolinium-difficulty breathing, throat swelling   . Midazolam Hcl Anaphylaxis    Difficulty breathing  . Shellfish Allergy Anaphylaxis  . Avandia (Rosiglitazone Maleate)     Patient doesn't remember reaction  . Kiwi Extract Itching and Swelling  . Metformin And Related Nausea And Vomiting and Other (See Comments)    diarrhea  . Other Itching    Patient is allergic to all nuts except peanuts.    Family History  Problem Relation Age of Onset  . Diabetes Father   . Kidney disease Father   . Other Neg Hx   . Diabetes Maternal Grandmother   . Hyperlipidemia Paternal Grandmother   . Stroke Paternal Grandmother     Prior to Admission medications   Medication Sig Start Date End Date Taking? Authorizing Provider  aspirin 325 MG  tablet Take 325 mg by mouth daily.    Yes Historical Provider, MD  EXFORGE HCT 10-320-25 MG TABS Take 1 tablet by mouth every evening.  05/26/11  Yes Historical Provider, MD  furosemide (LASIX) 40 MG tablet Take 20-40 mg by mouth daily.   Yes Historical Provider, MD  gabapentin (NEURONTIN) 300 MG capsule Take 300 mg by mouth 3 (three) times daily.   Yes Historical Provider, MD  insulin glargine (LANTUS) 100 UNIT/ML injection Inject 20-22 Units into the skin 2 (two) times daily. Inject 22 units in the morning and 20 units in the evening   Yes Historical Provider, MD  Multiple Vitamin (MULTIVITAMIN WITH MINERALS) TABS Take 1 tablet by mouth daily.   Yes Historical Provider, MD  promethazine (PHENERGAN) 12.5 MG tablet Take 12.5 mg by mouth every 6 (six) hours as needed. For nausea   Yes Historical Provider, MD  tiZANidine (ZANAFLEX) 4 MG capsule Take 4 mg by mouth at bedtime. 07/11/11  Yes Erick Colace, MD  traMADol (ULTRAM) 50 MG tablet Take 50-100 mg by mouth every 6 (six) hours as needed. For pain 10/07/11  Yes Ardeth Sportsman, MD  ziprasidone (GEODON) 80 MG capsule Take 80 mg by mouth daily.   Yes Historical Provider, MD  insulin lispro (HUMALOG) 100 UNIT/ML injection Inject 6-10 Units into the skin 3 (three) times daily before meals. Pt on sliding scale    Historical Provider, MD    Physical Exam: Filed Vitals:   10/16/11 1753 10/16/11 1804 10/16/11 1846 10/16/11 2043  BP: 158/85  152/74 166/73  Pulse: 102  91   Temp:  98.1 F (36.7 C) 97.9 F (36.6 C) 98.1 F (36.7 C)  TempSrc:  Oral Oral Other (Comment)  Resp: 18  18   Height:    5\' 6"  (1.676 m)  Weight:    114.2 kg (251 lb 12.3 oz)  SpO2: 100%  100%    General:  A&O x 4, NAD, nontoxic, pleasant/cooperative Head/Eye: No conjunctival hemorrhage, no icterus, [place]/AT, No nystagmus ENT:  No icterus,  No thrush, good dentition, no pharyngeal exudate Neck:  No masses, no lymphadenpathy, no bruits CV:  RRR, no rub, no gallop, no  S3 Lung:  CTAB, good air movement, no wheeze, no rhonchi Abdomen: soft, +BS, nondistended, no peritoneal signs, tender diffusely to palpation without any peritoneal signs  Ext: No cyanosis, No rashes, No petechiae, No lymphangitis, No edema Neuro: CNII-XII intact, strength 4/5 in bilateral upper and lower extremities, no dysmetria  Labs on Admission:  Basic Metabolic Panel:  Lab 10/16/11 1610 10/15/11  1753 10/14/11 2021  NA 134* 136 137  K 3.8 4.3 4.0  CL 100 102 101  CO2 23 22 22   GLUCOSE 204* 235* 205*  BUN 15 14 23   CREATININE 0.94 0.80 1.15*  CALCIUM 8.4 9.5 9.4  MG -- -- --  PHOS -- -- --   Liver Function Tests:  Lab 10/16/11 2029 10/15/11 1753 10/14/11 2021  AST 10 11 14   ALT 9 11 12   ALKPHOS 59 71 72  BILITOT 0.2* 0.2* 0.2*  PROT 6.5 7.6 7.7  ALBUMIN 2.7* 3.1* 3.1*    Lab 10/15/11 1753  LIPASE 34  AMYLASE --   No results found for this basename: AMMONIA:5 in the last 168 hours CBC:  Lab 10/16/11 2029 10/15/11 1753 10/14/11 2021  WBC 12.2* 12.6* 11.5*  NEUTROABS -- 8.4* 8.5*  HGB 10.0* 11.0* 10.6*  HCT 29.9* 33.0* 32.2*  MCV 77.5* 78.9 78.9  PLT 369 408* 420*   Cardiac Enzymes:  Lab 10/16/11 2030  CKTOTAL --  CKMB --  CKMBINDEX --  TROPONINI <0.30   BNP: No components found with this basename: POCBNP:5 CBG:  Lab 10/16/11 1804 10/16/11 1708 10/16/11 1150 10/16/11 0928 10/16/11 0752  GLUCAP 186* 162* 181* 218* 182*    Radiological Exams on Admission: Ct Abdomen Pelvis Wo Contrast  10/15/2011  *RADIOLOGY REPORT*  Clinical Data: Upper abdominal pain with nausea vomiting.  Status post left.  The ventral hernia repair on 10/03/2011.  CT ABDOMEN AND PELVIS WITHOUT CONTRAST  Technique:  Multidetector CT imaging of the abdomen and pelvis was performed following the standard protocol without intravenous contrast.  Comparison: 07/30/2011  Findings: Small area of posterior airspace disease in the right base suggests pneumonia.  The liver measures 21.2 cm in  cranial caudal length.  Liver and spleen show no focal abnormality on the study performed without intravenous contrast material.  The stomach, duodenum, pancreas, gallbladder, and adrenal glands are unremarkable.  Left kidney is atrophic with multiple areas of scarring.  Right kidney has unremarkable appearance.  No abdominal aortic aneurysm.  There is a trace amount of fluid anterior to the liver.  Imaging through the pelvis shows the patient to be status post ventral hernia repair with mesh placement.  Assessment of the ventral abdominal wall is limited by the lack of intravenous contrast material.  However, a 6.6 x 3.7 cm fluid collection is seen superficial to the rectus fascia about 11 mm caudal to the umbilicus and just anterior to the right iliac crest.  This fluid appears to track down into the right aspect of the  right rectus sheath inferiorly.  There is some fluid anterior to the opacified, nondilated small bowel loops in the anterior right lower quadrant. While the axial images suggest that some of this fluid is immediately adjacent/around the small bowel loops, the sagittal re- formations suggest that most of the fluid is actually anterior to the small bowel loops and I suspect that it is extraperitoneal but that there may be some laxity in either the mesh or the peritoneum in this region which causes the appearance on the axial images.  If this was truly intraperitoneal fluid, I would expect some free flowing fluid elsewhere in the peritoneal cavity, but there is none.  Diffuse edema is seen within the subcutaneous fat of the lower abdomen and upper pelvis.  There is no evidence for a small bowel or colonic obstruction at this time.  Fibroid changes seen in the uterus.  No evidence for an adnexal mass.  Bone windows reveal no worrisome lytic or sclerotic osseous lesions.  IMPRESSION: Fluid collection is seen along the lower right margin of the ventral mesh with a focal area of fluid projecting  superficial to the rectus fascia. The the fluid collection measures essentially water density and may be a seroma or old, liquefied hematoma. Superinfection of this fluid cannot be excluded by CT, but there is no evidence for gas within it.  Axial images suggest that some of the fluid surrounds small bowel loops, but this is less evident on the sagittal re-formations and I suspect that the fluid is all extraperitoneal but the appearance on the axial images is secondary to some laxity redundancy in the inferior mass/perineum.  However, I cannot completely exclude that there is some loculated anterior intraperitoneal component to the fluid.  No definite evidence for bowel herniation.  Although there are multiple small bowel loops in close proximity to the fluid in the anterior abdominal wall, there is no evidence for bowel obstruction, bowel wall thickening, or other bowel complication at this time.  These findings were discussed with Dr. Biagio Quint at 2003 hours on 10/15/2011.   Original Report Authenticated By: ERIC A. MANSELL, M.D.       Time spent: 80 minutes  Noelani Harbach [facility] Pager [phone]  If 7PM-7AM, please contact night-coverage [email or web address] Password [email or web address] 10/16/2011, 9:09 PM

## 2011-10-16 NOTE — Progress Notes (Addendum)
NT called RN to pt's room. Pt not "feeling well"..stated "I feel like I can't wake up". Weak and trembling. "My heart feels like it's racing". Tachy with mild irregularity noted. BP 158/85 HR 100. Afebrile. RR 20. HOB at 30'. Rapid Response RN notified to assess pt. No acute distress. Denied pain.

## 2011-10-16 NOTE — Progress Notes (Signed)
Rapid response called to pt room 1950 for syncope en route to BR. Upon my arrival at 1955 pt resting in bed lethargic but arousabel, denies pain but complains of heaviness on chest. Alert oriented X 4, generalized weakness. ABD soft, lap sites intact. Per Day shift RN it has been several hours since last pain med, pt has had syncopal episode before today. Pt placed on RRT monitor. Pt VS stable HR 87 SR, bp 173/74, rr 19, po2 97% on 2 L Dickinson at 2000, hr 90, bp 160/82, rr 17, po2 98% at 2015. Dr Purnell Shoemaker paged and updated on pt condition and symptoms. Orders received for labs and transfer to SD for closer monitoring.  Pt transferred to room 1240, C. Clovis Riley RN received report from [employer]

## 2011-10-16 NOTE — Progress Notes (Signed)
[facility]  Patient Status: Active (receiving services up to time of hospitalization)  [facility] is providing the following services: SN and PT  If patient discharges after hours, please call [phone].   Lanae Crumbly 10/16/2011, 11:54 AM

## 2011-10-16 NOTE — Progress Notes (Signed)
Dr. Abbey Chatters aware via phone of recent repeat episode. Complaints at the time and VS's reported to MD. Current status sleepy yet arousable and orientedx4. See new orders in EPIC per Dr. Abbey Chatters.

## 2011-10-16 NOTE — Evaluation (Signed)
Occupational Therapy Evaluation Patient Details Name: Tina [name] MRN: [id number] DOB: 08-05-1970 Today's Date: 10/16/2011 Time: 4098-1191 OT Time Calculation (min): 19 min  OT Assessment / Plan / Recommendation Clinical Impression  41 yo female recently discharged last week after lap ventral hernia repair and takedown/re-tracking of bladder. Pt presents now with epigastric pain with mobility. Skilled OT indicated to maximize independence with BADLs to supervision level in prep for safe d/c home with husband.    OT Assessment  Patient needs continued OT Services    Follow Up Recommendations  Home health OT;Supervision/Assistance - 24 hour    Barriers to Discharge      Equipment Recommendations  None recommended by OT    Recommendations for Other Services    Frequency  Min 2X/week    Precautions / Restrictions Precautions Precautions: Fall Restrictions Weight Bearing Restrictions: No   Pertinent Vitals/Pain Pt reported significant pain following activity despite being premedicated. Re-positioned for comfort.    ADL  Grooming: Simulated;Set up Where Assessed - Grooming: Unsupported sitting Upper Body Bathing: Simulated;Set up Where Assessed - Upper Body Bathing: Unsupported sitting Lower Body Bathing: Simulated;Minimal assistance Where Assessed - Lower Body Bathing: Supported sit to stand Upper Body Dressing: Simulated;Set up Where Assessed - Upper Body Dressing: Unsupported sitting Lower Body Dressing: Performed;Set up Where Assessed - Lower Body Dressing: Other (comment) (Pt donned socks while long sitting in bed.) Toilet Transfer: Simulated;Min guard Toilet Transfer Method: Sit to Barista: Other (comment) Nurse, children's) Toileting - Clothing Manipulation and Hygiene: Simulated;Minimal assistance Where Assessed - Toileting Clothing Manipulation and Hygiene: Standing ADL Comments: Pt limited this session by abdominal pain. Only able to walk a few  steps to the door before chair had to be brought up from behind. her. Pt may benefit from use of AE for LB ADLs. Pt was unable to reach feet or cross ankle over opposite knee while sitting EOB without excessive pain.    OT Diagnosis: Generalized weakness;Acute pain  OT Problem List: Decreased strength;Decreased activity tolerance;Pain;Decreased knowledge of use of DME or AE OT Treatment Interventions: Self-care/ADL training;Therapeutic activities;DME and/or AE instruction;Patient/family education   OT Goals Acute Rehab OT Goals OT Goal Formulation: With patient Time For Goal Achievement: 10/30/11 Potential to Achieve Goals: Good ADL Goals Pt Will Perform Grooming: with supervision;Standing at sink ADL Goal: Grooming - Progress: Goal set today Pt Will Perform Lower Body Bathing: with supervision;Sit to stand from chair;Sit to stand from bed;with adaptive equipment ADL Goal: Lower Body Bathing - Progress: Goal set today Pt Will Perform Lower Body Dressing: with supervision;Sit to stand from chair;Sit to stand from bed;with adaptive equipment ADL Goal: Lower Body Dressing - Progress: Goal set today Pt Will Transfer to Toilet: with supervision;Regular height toilet;Ambulation ADL Goal: Toilet Transfer - Progress: Goal set today Pt Will Perform Toileting - Clothing Manipulation: with supervision;Sitting on 3-in-1 or toilet;Standing ADL Goal: Toileting - Clothing Manipulation - Progress: Goal set today Pt Will Perform Toileting - Hygiene: with supervision;Sit to stand from 3-in-1/toilet ADL Goal: Toileting - Hygiene - Progress: Goal set today Pt Will Perform Tub/Shower Transfer: Shower transfer;with supervision ADL Goal: Tub/Shower Transfer - Progress: Goal set today  Visit Information  Last OT Received On: 10/16/11 Assistance Needed: +1    Subjective Data  Subjective: I took a shower this morning. Patient Stated Goal: Not asked.   Prior Functioning     Home Living Lives With:  Family Available Help at Discharge: Family Type of Home: House Home Access: Stairs to enter Entrance  Stairs-Number of Steps: 3 Entrance Stairs-Rails: Right Home Layout: Two level;Able to live on main level with bedroom/bathroom Alternate Level Stairs-Number of Steps: 2 flights with landing Alternate Level Stairs-Rails: Right Bathroom Shower/Tub: Health visitor: Standard Home Adaptive Equipment: Straight cane;Walker - rolling;Shower chair with back Prior Function Level of Independence: Independent with assistive device(s) Able to Take Stairs?: Yes Driving: Yes Vocation: Unemployed Communication Communication: No difficulties Dominant Hand: Right         Vision/Perception     Cognition  Overall Cognitive Status: Appears within functional limits for tasks assessed/performed Arousal/Alertness: Awake/alert Orientation Level: Appears intact for tasks assessed Behavior During Session: [facility] for tasks performed    Extremity/Trunk Assessment Right Upper Extremity Assessment RUE ROM/Strength/Tone: Deficits RUE ROM/Strength/Tone Deficits: Pt with rotator cuff tear earlier this year. Generalized weakness from old stroke. Left Upper Extremity Assessment LUE ROM/Strength/Tone: WFL for tasks assessed     Mobility Bed Mobility Rolling Right: 5: Supervision Right Sidelying to Sit: 4: Min guard;HOB elevated;With rails Details for Bed Mobility Assistance: min cues for hand placement, technique. HOB elevated to 90* Transfers Sit to Stand: 4: Min guard;With upper extremity assist;From bed Stand to Sit: 4: Min guard;With upper extremity assist;With armrests;To chair/3-in-1 Details for Transfer Assistance: min VCs for safety and hand placement.     Shoulder Instructions     Exercise     Balance Static Standing Balance Static Standing - Balance Support: Bilateral upper extremity supported;During functional activity Static Standing - Level of Assistance: 5: Stand by  assistance   End of Session OT - End of Session Equipment Utilized During Treatment: Gait belt Activity Tolerance: Patient limited by pain Patient left: in chair;with call bell/phone within reach  GO Functional Assessment Tool Used: Clinical Judgement Functional Limitation: Self care Self Care Current Status (W0981): At least 20 percent but less than 40 percent impaired, limited or restricted Self Care Goal Status (X9147): At least 1 percent but less than 20 percent impaired, limited or restricted   Harjit Douds A OTR/L [phone] 10/16/2011, 11:09 AM

## 2011-10-16 NOTE — Progress Notes (Signed)
Pt transferred via bed to CCU step down unit. Accompanied by nurse x2. Report given upon arrival to receiving  RN.

## 2011-10-16 NOTE — Progress Notes (Signed)
VSS. Afebrile. No acute distress noted. Sleepy yet feeling better. Denies pain. No trembling noted. No new orders received from MD. Pt instructed by MD, RR nurse and primary nurse to not ambulate or try to get OOB without calling for assistance. Call light with in reach.

## 2011-10-16 NOTE — Progress Notes (Signed)
Dr. Abbey Chatters paged for Rapid Response RN to report findings.

## 2011-10-16 NOTE — Progress Notes (Signed)
Remains sleepy yet arousable and oriented. Stated "feeling better" "it just comes and goes". No distress. Denies pain.

## 2011-10-16 NOTE — Progress Notes (Signed)
Pt brought over and placed in room 40 and made comfortable. Pt is alert and oriented and able to make needs known. Pt denies any s/s of pain or sob at this time. Pt does c/o of being dizzy at times. Pt with MD at bedside to assess pt status. Lab at bedside to obtain blood work. pccm informed of pt's arrival to floor. Will continue to monitor pt status. Pt with husband at bedside.

## 2011-10-16 NOTE — Progress Notes (Signed)
Pt needed to go to BR. RN x2 assisted pt to stand. Pt became weak and shaky. Eyes noted to roll back. Pt immediately sat back down on side of bed. BP 164/110 HR 96 RR 16  O2 sat 100% RA. Assisted pt to lay down in bed w/HOB at 30'. Instructed to take slow deep breaths. No pain. No acute distress.

## 2011-10-16 NOTE — Evaluation (Signed)
Physical Therapy Evaluation Patient Details Name: Tina [name] MRN: [id number] DOB: 11-03-1970 Today's Date: 10/16/2011 Time: 4098-1191 PT Time Calculation (min): 19 min  PT Assessment / Plan / Recommendation Clinical Impression    41 yo female recently discharged last week after lap ventral hernia repair and takedown/re-tracking of bladder. Pt presents now with epigastric pain with mobility. Skilled PT indicated to maximize independence  for return home with HHPT    PT Assessment  Patient needs continued PT services    Follow Up Recommendations  Home health PT    Barriers to Discharge        Equipment Recommendations  None recommended by PT;None recommended by OT    Recommendations for Other Services     Frequency Min 3X/week    Precautions / Restrictions Precautions Precautions: Fall Restrictions Weight Bearing Restrictions: No   Pertinent Vitals/Pain       Mobility  Bed Mobility Bed Mobility: Supine to Sit Rolling Right: 5: Supervision Right Sidelying to Sit: 4: Min guard;HOB elevated;With rails Details for Bed Mobility Assistance: min cues for hand placement, technique. HOB elevated to 90* Transfers Transfers: Sit to Stand;Stand to Sit Sit to Stand: 4: Min guard;With upper extremity assist;From bed Stand to Sit: 4: Min guard;With upper extremity assist;With armrests;To chair/3-in-1 Details for Transfer Assistance: min VCs for safety and hand placement. Ambulation/Gait Ambulation/Gait Assistance: 4: Min guard Ambulation Distance (Feet): 7 Feet Assistive device: Rolling walker Ambulation/Gait Assistance Details: slow gait velocity, pain limiting activity/amb Gait Pattern: Step-to pattern;Decreased step length - left    Shoulder Instructions     Exercises     PT Diagnosis: Difficulty walking  PT Problem List: Decreased strength;Decreased range of motion;Decreased activity tolerance;Decreased balance;Decreased mobility;Decreased knowledge of use of DME PT  Treatment Interventions: DME instruction;Gait training;Functional mobility training;Therapeutic activities;Therapeutic exercise;Balance training;Patient/family education;Stair training   PT Goals Acute Rehab PT Goals PT Goal Formulation: With patient Time For Goal Achievement: 10/30/11 Potential to Achieve Goals: Good Pt will Roll Supine to Right Side: with modified independence PT Goal: Rolling Supine to Right Side - Progress: Goal set today Pt will go Supine/Side to Sit: with modified independence PT Goal: Supine/Side to Sit - Progress: Goal set today Pt will go Sit to Stand: with supervision PT Goal: Sit to Stand - Progress: Goal set today Pt will Ambulate: 16 - 50 feet;with supervision;with rolling walker PT Goal: Ambulate - Progress: Goal set today Pt will Go Up / Down Stairs: 3-5 stairs;with min assist;with rolling walker;with least restrictive assistive device PT Goal: Up/Down Stairs - Progress: Goal set today  Visit Information  Last PT Received On: 10/16/11 Assistance Needed: +1 PT/OT Co-Evaluation/Treatment: Yes    Subjective Data  Subjective: hurting Patient Stated Goal: home   Prior Functioning  Home Living Lives With: Family Available Help at Discharge: Family Type of Home: House Home Access: Stairs to enter Secretary/administrator of Steps: 3 Entrance Stairs-Rails: Right Home Layout: Two level;Able to live on main level with bedroom/bathroom Alternate Level Stairs-Number of Steps: 2 flights with landing Alternate Level Stairs-Rails: Right Bathroom Shower/Tub: Health visitor: Standard Home Adaptive Equipment: Straight cane;Walker - rolling;Shower chair with back Prior Function Level of Independence: Independent with assistive device(s) Able to Take Stairs?: Yes Driving: Yes Vocation: Unemployed Communication Communication: No difficulties Dominant Hand: Right    Cognition  Overall Cognitive Status: Appears within functional limits for  tasks assessed/performed Arousal/Alertness: Awake/alert Orientation Level: Appears intact for tasks assessed Behavior During Session: [facility] for tasks performed    Extremity/Trunk Assessment  Right Upper Extremity Assessment RUE ROM/Strength/Tone: Deficits RUE ROM/Strength/Tone Deficits: Pt with rotator cuff tear earlier this year. Generalized weakness from old stroke. Left Upper Extremity Assessment LUE ROM/Strength/Tone: WFL for tasks assessed Right Lower Extremity Assessment RLE ROM/Strength/Tone: WFL for tasks assessed RLE ROM/Strength/Tone Deficits: grossly WFL but fatigues quickly Left Lower Extremity Assessment LLE ROM/Strength/Tone: Deficits;Due to pain   Balance Static Standing Balance Static Standing - Balance Support: Bilateral upper extremity supported;During functional activity Static Standing - Level of Assistance: 5: Stand by assistance  End of Session PT - End of Session Activity Tolerance: Patient limited by pain Patient left: in chair;with call bell/phone within reach  GP Functional Assessment Tool Used: clincal judgement Functional Limitation: Mobility: Walking and moving around Mobility: Walking and Moving Around Current Status (G2952): At least 1 percent but less than 20 percent impaired, limited or restricted Mobility: Walking and Moving Around Goal Status [phone]): At least 1 percent but less than 20 percent impaired, limited or restricted   [facility] 10/16/2011, 11:26 AM

## 2011-10-16 NOTE — Progress Notes (Signed)
Repeat feeling of weakness and trembling. Vital signs taken and WNL -see flowsheet. Oxygen sat 100%. No distress. Stated "I just feel like I'm drifting off and I can't move or have the strength to call anyone."  Denies pain.

## 2011-10-16 NOTE — Progress Notes (Signed)
Rapid Response RN, Pam, into see and assess pt. Monitored vitals and conducted neuro checks. Although sleepy, pt responsive and able to follow directions.

## 2011-10-17 ENCOUNTER — Inpatient Hospital Stay (HOSPITAL_COMMUNITY): Payer: Federal, State, Local not specified - PPO

## 2011-10-17 ENCOUNTER — Inpatient Hospital Stay (HOSPITAL_COMMUNITY)
Admission: AD | Admit: 2011-10-17 | Discharge: 2011-10-17 | Disposition: A | Discharge status: Home / Self Care | Payer: Federal, State, Local not specified - PPO | Source: Ambulatory Visit | Attending: Internal Medicine | Admitting: Internal Medicine

## 2011-10-17 DIAGNOSIS — M545 Low back pain, unspecified: Secondary | ICD-10-CM

## 2011-10-17 DIAGNOSIS — D72829 Elevated white blood cell count, unspecified: Secondary | ICD-10-CM

## 2011-10-17 DIAGNOSIS — E119 Type 2 diabetes mellitus without complications: Secondary | ICD-10-CM

## 2011-10-17 DIAGNOSIS — R1012 Left upper quadrant pain: Secondary | ICD-10-CM

## 2011-10-17 LAB — CBC WITH DIFFERENTIAL/PLATELET
Basophils Relative: 0 % (ref 0–1)
Eosinophils Absolute: 2.8 10*3/uL — ABNORMAL HIGH (ref 0.0–0.7)
Eosinophils Relative: 25 % — ABNORMAL HIGH (ref 0–5)
Hemoglobin: 10.7 g/dL — ABNORMAL LOW (ref 12.0–15.0)
Lymphocytes Relative: 16 % (ref 12–46)
MCHC: 33.4 g/dL (ref 30.0–36.0)
Neutrophils Relative %: 55 % (ref 43–77)
Platelets: 395 10*3/uL (ref 150–400)
RBC: 4.15 MIL/uL (ref 3.87–5.11)

## 2011-10-17 LAB — RAPID URINE DRUG SCREEN, HOSP PERFORMED
Barbiturates: NOT DETECTED
Cocaine: NOT DETECTED
Tetrahydrocannabinol: NOT DETECTED

## 2011-10-17 LAB — TROPONIN I: Troponin I: <0.3 ng/mL (ref <0.30)

## 2011-10-17 LAB — HEMOGLOBIN A1C: Hgb A1c MFr Bld: 8.3 % — ABNORMAL HIGH (ref <5.7)

## 2011-10-17 LAB — URINALYSIS, MICROSCOPIC ONLY
Glucose, UA: 500 mg/dL — AB
Nitrite: NEGATIVE
Protein, ur: NEGATIVE mg/dL

## 2011-10-17 LAB — GLUCOSE, CAPILLARY
Glucose-Capillary: 191 mg/dL — ABNORMAL HIGH (ref 70–99)
Glucose-Capillary: 260 mg/dL — ABNORMAL HIGH (ref 70–99)

## 2011-10-17 LAB — TSH: TSH: 1.749 u[IU]/mL (ref 0.350–4.500)

## 2011-10-17 LAB — LIPID PANEL: HDL: 28 mg/dL — ABNORMAL LOW (ref >39)

## 2011-10-17 MED ORDER — GABAPENTIN 300 MG PO CAPS
300.0000 mg | ORAL_CAPSULE | Freq: Three times a day (TID) | ORAL | Status: DC
  Administered 2011-10-17 – 2011-10-18 (×5): 300 mg via ORAL
  Filled 2011-10-17 (×8): qty 1

## 2011-10-17 MED ORDER — IRBESARTAN 150 MG PO TABS
150.0000 mg | ORAL_TABLET | Freq: Every day | ORAL | Status: DC
  Administered 2011-10-17 – 2011-10-18 (×2): 150 mg via ORAL
  Filled 2011-10-17 (×4): qty 1

## 2011-10-17 MED ORDER — LORAZEPAM 2 MG/ML IJ SOLN: 1.0000 mg | Freq: Three times a day (TID) | INTRAMUSCULAR | Status: DC | PRN

## 2011-10-17 MED ORDER — HALOPERIDOL LACTATE 5 MG/ML IJ SOLN
INTRAMUSCULAR | Status: AC
  Administered 2011-10-17: 1 mg
  Filled 2011-10-17: qty 1

## 2011-10-17 MED ORDER — POLYETHYLENE GLYCOL 3350 17 G PO PACK
17.0000 g | PACK | Freq: Two times a day (BID) | ORAL | Status: DC
  Administered 2011-10-17 – 2011-10-18 (×3): 17 g via ORAL
  Filled 2011-10-17 (×6): qty 1

## 2011-10-17 MED ORDER — NAPROXEN 500 MG PO TABS
500.0000 mg | ORAL_TABLET | Freq: Two times a day (BID) | ORAL | Status: DC
  Administered 2011-10-17 – 2011-10-18 (×4): 500 mg via ORAL
  Filled 2011-10-17 (×8): qty 1

## 2011-10-17 MED ORDER — FENTANYL CITRATE 0.05 MG/ML IJ SOLN: 25.0000 ug | INTRAMUSCULAR | Status: DC | PRN

## 2011-10-17 MED ORDER — HALOPERIDOL LACTATE 5 MG/ML IJ SOLN
1.0000 mg | Freq: Once | INTRAMUSCULAR | Status: AC
  Administered 2011-10-17: 1 mg via INTRAVENOUS
  Filled 2011-10-17: qty 1

## 2011-10-17 MED ORDER — MAGNESIUM HYDROXIDE 400 MG/5ML PO SUSP: 30.0000 mL | Freq: Two times a day (BID) | ORAL | Status: DC | PRN

## 2011-10-17 MED ORDER — AMLODIPINE BESYLATE 5 MG PO TABS
5.0000 mg | ORAL_TABLET | Freq: Every day | ORAL | Status: DC
  Administered 2011-10-17 – 2011-10-18 (×2): 5 mg via ORAL
  Filled 2011-10-17 (×3): qty 1

## 2011-10-17 MED ORDER — QUETIAPINE FUMARATE 25 MG PO TABS
25.0000 mg | ORAL_TABLET | Freq: Every day | ORAL | Status: DC | PRN
  Administered 2011-10-17 – 2011-10-19 (×2): 25 mg via ORAL
  Filled 2011-10-17 (×2): qty 1

## 2011-10-17 MED ORDER — HALOPERIDOL LACTATE 5 MG/ML IJ SOLN: 1.0000 mg | Freq: Once | INTRAMUSCULAR | Status: AC

## 2011-10-17 MED ORDER — LORAZEPAM BOLUS VIA INFUSION: 0.5000 mg | Freq: Three times a day (TID) | INTRAVENOUS | Status: DC | PRN

## 2011-10-17 NOTE — Consult Note (Signed)
[facility] PROGRESS NOTE  Tina [name] ZOX:[id number] DOB: 02/17/1970 DOA: 10/15/2011 PCP: Michiel Sites, MD  Requesting physician: Abbey Chatters  Date of consultation: 10/16/11  Reason for consultation: altered mental status   Brief narrative: 41 year old female with multiple medical problems including hypertension, stroke, anxiety, and diabetes mellitus was admitted to the hospital on 10/15/2011 status post fall and abdominal pain.  Impression/Recommendations   Acute mental status change  - perhaps related to opioid use however not entirely clear why she had syncopal event yesterday - troponin x 1 negative - would follow up MRI brain  - CBG's under relatively good control - UDS negative - follow up PT evaluation  Hypertension  -Restart the amlodipine and valsartan   Diabetes mellitus type 2  - continue current insulin regimen  History of stroke  -Restarted aspirin   Leukocytosis  - urinalysis negative  Code Status: full code Family Communication: at bedside Disposition Plan: per primary team  [name], MD  [facility] Pager [phone]  If 7PM-7AM, please contact night-coverage [email or web address] Password [email or web address] 10/17/2011, 3:47 PM   LOS: 2 days   HPI/Subjective: No acute events overnight. Yesterday evening patient had a syncopal event.  Objective: Filed Vitals:   10/17/11 0800 10/17/11 0958 10/17/11 1200 10/17/11 1353  BP:  145/74  130/84  Pulse: 95 87  95  Temp: 98 F (36.7 C)  98.5 F (36.9 C)   TempSrc: Oral  Oral   Resp: 27 30  22   Height:      Weight:      SpO2: 99% 100%  100%    Intake/Output Summary (Last 24 hours) at 10/17/11 1547 Last data filed at 10/17/11 1400  Gross per 24 hour  Intake   2195 ml  Output   3700 ml  Net  -1505 ml    Exam:   General:  Pt is alert, follows commands appropriately, not in acute distress  Cardiovascular: Regular rate and rhythm, S1/S2, no murmurs, no rubs, no gallops  Respiratory: Clear to  auscultation bilaterally, no wheezing, no crackles, no rhonchi  Abdomen: Soft, some tenderness over mid abdomen, bowel sounds present, no guarding  Extremities: No edema, pulses DP and PT palpable bilaterally  Neuro: Grossly nonfocal  Data Reviewed: Basic Metabolic Panel:  Lab 10/16/11 8295 10/15/11 1753 10/14/11 2021  NA 134* 136 137  K 3.8 4.3 4.0  CL 100 102 101  CO2 23 22 22   GLUCOSE 204* 235* 205*  BUN 15 14 23   CREATININE 0.94 0.80 1.15*  CALCIUM 8.4 9.5 9.4  MG -- -- --  PHOS -- -- --   Liver Function Tests:  Lab 10/16/11 2029 10/15/11 1753 10/14/11 2021  AST 10 11 14   ALT 9 11 12   ALKPHOS 59 71 72  BILITOT 0.2* 0.2* 0.2*  PROT 6.5 7.6 7.7  ALBUMIN 2.7* 3.1* 3.1*    Lab 10/15/11 1753  LIPASE 34  AMYLASE --   No results found for this basename: AMMONIA:5 in the last 168 hours CBC:  Lab 10/17/11 0315 10/16/11 2029 10/15/11 1753 10/14/11 2021  WBC 11.2* 12.2* 12.6* 11.5*  NEUTROABS 6.2 -- 8.4* 8.5*  HGB 10.7* 10.0* 11.0* 10.6*  HCT 32.0* 29.9* 33.0* 32.2*  MCV 77.1* 77.5* 78.9 78.9  PLT 395 369 408* 420*   Cardiac Enzymes:  Lab 10/17/11 0315 10/16/11 2030  CKTOTAL -- --  CKMB -- --  CKMBINDEX -- --  TROPONINI <0.30 <0.30   BNP: No components found with this basename: POCBNP:5 CBG:  Lab 10/16/11 2203  10/16/11 1804 10/16/11 1708 10/16/11 1150 10/16/11 0928  GLUCAP 191* 186* 162* 181* 218*    Recent Results (from the past 240 hour(s))  CULTURE, BLOOD (ROUTINE X 2)     Status: Normal (Preliminary result)   Collection Time   10/15/11  8:29 PM      Component Value Range Status Comment   Specimen Description BLOOD RIGHT ARM   Final    Special Requests BOTTLES DRAWN AEROBIC AND ANAEROBIC 5CC   Final    Culture  Setup Time 10/16/2011 02:40   Final    Culture     Final    Value:        BLOOD CULTURE RECEIVED NO GROWTH TO DATE CULTURE WILL BE HELD FOR 5 DAYS BEFORE ISSUING A FINAL NEGATIVE REPORT   Report Status PENDING   Incomplete   CULTURE, BLOOD  (ROUTINE X 2)     Status: Normal (Preliminary result)   Collection Time   10/15/11  8:31 PM      Component Value Range Status Comment   Specimen Description BLOOD RIGHT HAND   Final    Special Requests BOTTLES DRAWN AEROBIC ONLY 3CC   Final    Culture  Setup Time 10/16/2011 02:40   Final    Culture     Final    Value:        BLOOD CULTURE RECEIVED NO GROWTH TO DATE CULTURE WILL BE HELD FOR 5 DAYS BEFORE ISSUING A FINAL NEGATIVE REPORT   Report Status PENDING   Incomplete      Studies: Ct Abdomen Pelvis Wo Contrast  10/15/2011  *RADIOLOGY REPORT*  Clinical Data: Upper abdominal pain with nausea vomiting.  Status post left.  The ventral hernia repair on 10/03/2011.  CT ABDOMEN AND PELVIS WITHOUT CONTRAST  Technique:  Multidetector CT imaging of the abdomen and pelvis was performed following the standard protocol without intravenous contrast.  Comparison: 07/30/2011  Findings: Small area of posterior airspace disease in the right base suggests pneumonia.  The liver measures 21.2 cm in cranial caudal length.  Liver and spleen show no focal abnormality on the study performed without intravenous contrast material.  The stomach, duodenum, pancreas, gallbladder, and adrenal glands are unremarkable.  Left kidney is atrophic with multiple areas of scarring.  Right kidney has unremarkable appearance.  No abdominal aortic aneurysm.  There is a trace amount of fluid anterior to the liver.  Imaging through the pelvis shows the patient to be status post ventral hernia repair with mesh placement.  Assessment of the ventral abdominal wall is limited by the lack of intravenous contrast material.  However, a 6.6 x 3.7 cm fluid collection is seen superficial to the rectus fascia about 11 mm caudal to the umbilicus and just anterior to the right iliac crest.  This fluid appears to track down into the right aspect of the  right rectus sheath inferiorly.  There is some fluid anterior to the opacified, nondilated small bowel  loops in the anterior right lower quadrant. While the axial images suggest that some of this fluid is immediately adjacent/around the small bowel loops, the sagittal re- formations suggest that most of the fluid is actually anterior to the small bowel loops and I suspect that it is extraperitoneal but that there may be some laxity in either the mesh or the peritoneum in this region which causes the appearance on the axial images.  If this was truly intraperitoneal fluid, I would expect some free flowing fluid elsewhere in the peritoneal  cavity, but there is none.  Diffuse edema is seen within the subcutaneous fat of the lower abdomen and upper pelvis.  There is no evidence for a small bowel or colonic obstruction at this time.  Fibroid changes seen in the uterus.  No evidence for an adnexal mass.  Bone windows reveal no worrisome lytic or sclerotic osseous lesions.  IMPRESSION: Fluid collection is seen along the lower right margin of the ventral mesh with a focal area of fluid projecting superficial to the rectus fascia. The the fluid collection measures essentially water density and may be a seroma or old, liquefied hematoma. Superinfection of this fluid cannot be excluded by CT, but there is no evidence for gas within it.  Axial images suggest that some of the fluid surrounds small bowel loops, but this is less evident on the sagittal re-formations and I suspect that the fluid is all extraperitoneal but the appearance on the axial images is secondary to some laxity redundancy in the inferior mass/perineum.  However, I cannot completely exclude that there is some loculated anterior intraperitoneal component to the fluid.  No definite evidence for bowel herniation.  Although there are multiple small bowel loops in close proximity to the fluid in the anterior abdominal wall, there is no evidence for bowel obstruction, bowel wall thickening, or other bowel complication at this time.  These findings were discussed with  Dr. Biagio Quint at 2003 hours on 10/15/2011.   Original Report Authenticated By: ERIC A. MANSELL, M.D.    Dg Chest Port 1 View  10/16/2011  *RADIOLOGY REPORT*  Clinical Data: Chest pain  PORTABLE CHEST - 1 VIEW  Comparison: 11/11/2009  Findings: 2205 hours. The cardiopericardial silhouette is enlarged. The lungs are clear without focal infiltrate, edema, pneumothorax or pleural effusion. Imaged bony structures of the thorax are intact.  IMPRESSION: Enlargement the cardiopericardial silhouette without edema or focal lung consolidation.   Original Report Authenticated By: ERIC A. MANSELL, M.D.     Scheduled Meds:   . amLODipine  5 mg Oral Daily  . aspirin  81 mg Oral Daily  . gabapentin  300 mg Oral TID  . insulin aspart  0-15 Units Subcutaneous TID WC  . insulin glargine  10 Units Subcutaneous QHS  . insulin glargine  11 Units Subcutaneous Daily  . irbesartan  150 mg Oral Daily  . lip balm  1 application Topical BID  . naproxen  500 mg Oral BID WC  . polyethylene glycol  17 g Oral BID  . sodium chloride  500 mL Intravenous Once  . traMADol  50 mg Oral Once  . DISCONTD: amLODipine  10 mg Oral Daily  . DISCONTD: gabapentin  400 mg Oral TID  . DISCONTD: irbesartan  300 mg Oral Daily  . DISCONTD: ketorolac  30 mg Intravenous Q6H  . DISCONTD: psyllium  1 packet Oral BID  . DISCONTD: tiZANidine  8 mg Oral QHS   Continuous Infusions:   . sodium chloride 50 mL/hr at 10/17/11 1400

## 2011-10-17 NOTE — Consult Note (Signed)
Reason for Consult: Transient inability to move Referring Physician: Karie Soda  CC: Episode of inability to move  History is obtained from: Patient  HPI: Tina [name] is an 41 y.o. female with a history of a pontine stroke and several months of headaches followed by generalized weakness. Yesterday, she had 3 episodes lasting a few minutes each where she felt very sleepy, and felt that she fell asleep and was unable to move, but could hear what happened around her. She had just been given opiates shortly before this.   Her typical headaches with a pressure, and the she feels a "pop" and suddenly feels very weak all over. This happened 2 - 3 times per moth previously, but since her surgery has been happening more frequently.   Her EEG was normal.   ROS: An 11 point ROS was performed and is negative except as noted in the HPI.  Past Medical History  Diagnosis Date  . Flaccid hemiplegia affecting dominant side   . Enthesopathy of hip region     RIGHT HIP PAIN WITH PAIN DOWN RIGHT LEG  . Lumbago     LOWER BACK  . Spastic hemiplegia affecting dominant side   . Panic disorder without agoraphobia   . Depression   . Anxiety   . Diabetes mellitus   . Hypertension   . Blood transfusion     IN 2012  AFTER C -SECTION  . Cerebral thrombosis with cerebral infarction JUNE 2011    RIGHT SIDED WEAKNESS ( ARM AND LEG ) AND SPASMS  . Stroke   . Right rotator cuff tear     PAIN IN RIGHT SHOULDER  . Ventral hernia     RIGHT LOWER QUADRANT-CAUSING SOME PAIN  . Cough     STARTED 09/24/11--NO OTHER SYMPTOMS.  Marland Kitchen Anemia     DURING MENSES--HAS HEAVY BLEEDING WITH PERODS  . Headache     MIGRAINES--NOT REALLY HEADACHE-MORE LIKE PRESSURE SENSATION IN HEAD-FEELS DIZZIY AND  FAINT AS THE PRESSURE RESOLVES  . Diabetic neuropathy     BOTH FEET --COMES AND GOES  . Restless leg syndrome     DIAGNOSED BY SLEEP STUDY - PT TOLD SHE DID NOT HAVE SLEEP APNEA  . Rash     HANDS, ARMS --STATES HX OF RASH  EVER SINCE CHILDBIRTH/PREGNANCY.  STATES THE RASH OFTEN OCCURS WHEN SHE IS REALLY STRESSED.    Exam: Current vital signs: BP 130/84  Pulse 93  Temp 98.4 F (36.9 C) (Oral)  Resp 27  Ht 5\' 6"  (1.676 m)  Wt 114.2 kg (251 lb 12.3 oz)  BMI 40.64 kg/m2  SpO2 94%  LMP 10/15/2011 Vital signs in last 24 hours: Temp:  [97.9 F (36.6 C)-98.9 F (37.2 C)] 98.4 F (36.9 C) (10/04 2000) Pulse Rate:  [87-97] 93  (10/04 1900) Resp:  [20-30] 27  (10/04 1900) BP: (130-166)/(73-84) 130/84 mmHg (10/04 1353) SpO2:  [86 %-100 %] 94 % (10/04 1900) Weight:  [114.2 kg (251 lb 12.3 oz)] 114.2 kg (251 lb 12.3 oz) (10/03 2043)  General: In bed, NAD CV: RRR Mental Status: Patient is awake, alert, oriented to person, place, month, year, and situation. Immediate and remote memory are intact. Patient is able to give a clear and coherent history. Cranial Nerves: II: Visual Fields are full. Pupils are equal, round, and reactive to light.  Discs are difficult to visualize. III,IV, VI: EOMI without ptosis or diploplia.  V,VII: Facial sensation and movement are symmetric.  VIII: hearing is intact to voice X:  Uvula elevates symmetrically XI: Shoulder shrug is symmetric. XII: tongue is midline without atrophy or fasciculations.  Motor: Tone is normal. Bulk is normal. 5/5 strength was present in all four extremities.  Sensory: Sensation is symmetric to light touch and temperature in the arms and legs. Deep Tendon Reflexes: 2+ and symmetric in the  patellae. 2+ in left bicep, 3+ right bicep.  Cerebellar: FNF intact bilaterally   I have reviewed labs in epic and the results pertinent to this consultation are: Mild leukocytosis.   Impression: 41 year old F with a history of pontine infarct who presents with three episodes of concious feeling of being asleep and unable to move. Differential would include sedation from medication(most likely), also possible are cataplexy(though no history of narcoplepsy) or  conversion disorder. Her headaches with sudden weakness afterwards are unusual, but may represent migraine variant. She has been started on gabapentin by her neurologist for this.   Recommendations: 1) Continue gabapentin as headache prophylaxis.  2) Will follow up MRI once performed 3) Would not start anti-epileptic therapy at this time as I have a low index of suspicion for seizures and normal EEG.  4) Limit sedating medications  Ritta Slot, MD [facility] [phone]

## 2011-10-17 NOTE — Procedures (Signed)
History: 41 yo F with three episodes of decreased ability to respond yesterday.   Sedation: None  Background: There is a well defined posterior dominant rhythm of 9.5 Hz that attenuates with eye opening. The recording begins with sleep, but the patient arouses and the background consists predominantly of alpha. There is central alpha with shifting predominance(mu). The patient then becomes drowsy with a symmetric incerase in slow activity and sleep structures are again seen briefly.   Photic stimulation: Physiologic driving is present  EEG Diagnosis: Normal EEG  Clinical Interpretation: This normal EEG is recorded during waking and sleep. There was no seizure or seizure predisposition recorded on this study.   Ritta Slot, MD [facility] [phone]

## 2011-10-17 NOTE — Progress Notes (Signed)
[id number] Earlene Plater, RN, BSN, CCM: CHART REVIEWED AND UPDATED. NO DISCHARGE NEEDS PRESENT AT THIS TIME. CASE MANAGEMENT [phone]

## 2011-10-17 NOTE — Progress Notes (Signed)
PT Cancellation Note  ___Treatment cancelled today due to medical issues with patient which prohibited therapy  _X_ Treatment cancelled today due to patient receiving procedure or test ..........Marland KitchenEEG  ___ Treatment cancelled today due to patient's refusal to participate   ___ Treatment cancelled today due to  Felecia Shelling  PTA [facility]  Acute  Rehab Pager     [phone]

## 2011-10-17 NOTE — Progress Notes (Signed)
Offsite EEG completed at [facility]. 

## 2011-10-17 NOTE — Progress Notes (Signed)
Tina [name] [id number] [birth year]   Subjective:  Presyncopal events noted Pt feels cold Pt in SD unit now Sore[place] in LUQ at 10mm port site - only place & much less Mild nausea  Constipated Right pinky toe numbness mild & unchanged  Objective:  Vital signs:  Filed Vitals:   10/16/11 2030 10/16/11 2043 10/17/11 0022 10/17/11 0400  BP: 166/73 166/73 153/77   Pulse:   97   Temp:  98.1 F (36.7 C) 98.9 F (37.2 C) 98.4 F (36.9 C)  TempSrc:  Other (Comment) Oral Oral  Resp: 24  20   Height:  5\' 6"  (1.676 m)    Weight:  251 lb 12.3 oz (114.2 kg)    SpO2: 86%  100%     Last BM Date: 10/15/11  Intake/Output   Yesterday:  10/03 0701 - 10/04 0700 In: 2377.5 [P.O.:800; I.V.:1577.5] Out: 1700 [Urine:1700] This shift:  Total I/O In: 1145 [P.O.:220; I.V.:925] Out: 1300 [Urine:1300]  Bowel function:  Flatus: y  BM: n  Physical Exam:  General: Pt groggy but /oriented x4 in mild acute distress Eyes: PERRL, normal EOM.  Sclera clear.  No icterus Neuro: CN II-XII intact w/o focal sensory/motor deficits.  Mild spasticity RUE at first, then calms down Lymph: No head/neck/groin lymphadenopathy Psych:  No delerium/psychosis/paranoia.  Sleepy but arousable HENT: Normocephalic, Mucus membranes moist.  No thrush Neck: Supple, No tracheal deviation Chest: No chest wall pain w good excursion CV:  Pulses intact.  Regular rhythm Abdomen: Soft.  Nondistended.  Mod tender at LUQ 10mm port site only.  No incarcerated hernias.  No cellulitis.  Obese Ext:  SCDs BLE.  No mjr edema.  No cyanosis Skin: No petechiae / purpurae  Problem List:  Principal Problem:  *Abdominal pain, LUQ (left upper quadrant) Active Problems:  Right spastic hemiparesis  Sciatica  Hernia, incisional, RLQ, s/p lap repair Sep 2013  Obesity (BMI 30-39.9)  Mental status change  Leukocytosis  DM2 (diabetes mellitus, type 2)   Assessment  Tina [name]  41 y.o. female       Abd pain most  likely from severe pull s/p fall - improving  MS changes with ? Presyncopal events   Plan:  -max non-narcotic pain control & changed to fentanyl/hydrocodone now -antiinflammatory regimen -holding her home muscle relaxant -bowel regimen - change to Miralax.  Offer enema as well -medicine help appreciated - EEG & MRI pending -called [facility] office (per pt has seen Dr. Pearlean Brownie in past).  Ask Neurology to see & offer insights -PT/OT eval for balance -bowel regimen & retry PO  -keep in SDUnit until OK by med & Neuro.   -VTE prophylaxis- SCDs, etc -mobilize as tolerated to help recovery  Ardeth Sportsman, M.D., F.A.C.S. Gastrointestinal and Minimally Invasive Surgery [facility] [address] [place], [place] [address] [phone] Main / Paging [phone] Voice Mail   10/17/2011  CARE TEAM:  PCP: Michiel Sites, MD  Outpatient Care Team: Patient Care Team: Michiel Sites, MD as PCP - General Serita Kyle, MD as Consulting Physician (Obstetrics and Gynecology)  Inpatient Treatment Team: Treatment Team: Attending Provider: Ardeth Sportsman, MD; Registered Nurse: Edythe Clarity, RN; Registered Nurse: Patsey Berthold, RN; Registered Nurse: Tristan Schroeder, RN; Consulting Physician: Alison Murray, MD; Rounding Team: Alton Revere, MD   Results:   Labs: Results for orders placed during the hospital encounter of 10/15/11 (from the past 48 hour(s))  CBC WITH  DIFFERENTIAL     Status: Abnormal   Collection Time   10/15/11  5:53 PM      Component Value Range Comment   WBC 12.6 (*) 4.0 - 10.5 K/uL    RBC 4.18  3.87 - 5.11 MIL/uL    Hemoglobin 11.0 (*) 12.0 - 15.0 g/dL    HCT 82.9 (*) 56.2 - 46.0 %    MCV 78.9  78.0 - 100.0 fL    MCH 26.3  26.0 - 34.0 pg    MCHC 33.3  30.0 - 36.0 g/dL    RDW 13.0  86.5 - 78.4 %    Platelets 408 (*) 150 - 400 K/uL    Neutrophils Relative 67  43 - 77 %     Neutro Abs 8.4 (*) 1.7 - 7.7 K/uL    Lymphocytes Relative 14  12 - 46 %    Lymphs Abs 1.7  0.7 - 4.0 K/uL    Monocytes Relative 4  3 - 12 %    Monocytes Absolute 0.5  0.1 - 1.0 K/uL    Eosinophils Relative 16 (*) 0 - 5 %    Eosinophils Absolute 2.0 (*) 0.0 - 0.7 K/uL    Basophils Relative 0  0 - 1 %    Basophils Absolute 0.0  0.0 - 0.1 K/uL   COMPREHENSIVE METABOLIC PANEL     Status: Abnormal   Collection Time   10/15/11  5:53 PM      Component Value Range Comment   Sodium 136  135 - 145 mEq/L    Potassium 4.3  3.5 - 5.1 mEq/L    Chloride 102  96 - 112 mEq/L    CO2 22  19 - 32 mEq/L    Glucose, Bld 235 (*) 70 - 99 mg/dL    BUN 14  6 - 23 mg/dL    Creatinine, Ser 6.96  0.50 - 1.10 mg/dL    Calcium 9.5  8.4 - 29.5 mg/dL    Total Protein 7.6  6.0 - 8.3 g/dL    Albumin 3.1 (*) 3.5 - 5.2 g/dL    AST 11  0 - 37 U/L    ALT 11  0 - 35 U/L    Alkaline Phosphatase 71  39 - 117 U/L    Total Bilirubin 0.2 (*) 0.3 - 1.2 mg/dL    GFR calc non Af Amer 90 (*) >90 mL/min    GFR calc Af Amer >90  >90 mL/min   LIPASE, BLOOD     Status: Normal   Collection Time   10/15/11  5:53 PM      Component Value Range Comment   Lipase 34  11 - 59 U/L   GLUCOSE, CAPILLARY     Status: Abnormal   Collection Time   10/15/11 10:31 PM      Component Value Range Comment   Glucose-Capillary 190 (*) 70 - 99 mg/dL   GLUCOSE, CAPILLARY     Status: Abnormal   Collection Time   10/16/11  7:52 AM      Component Value Range Comment   Glucose-Capillary 182 (*) 70 - 99 mg/dL   GLUCOSE, CAPILLARY     Status: Abnormal   Collection Time   10/16/11  9:28 AM      Component Value Range Comment   Glucose-Capillary 218 (*) 70 - 99 mg/dL   GLUCOSE, CAPILLARY     Status: Abnormal   Collection Time   10/16/11 11:50 AM      Component Value  Range Comment   Glucose-Capillary 181 (*) 70 - 99 mg/dL   GLUCOSE, CAPILLARY     Status: Abnormal   Collection Time   10/16/11  5:08 PM      Component Value Range Comment    Glucose-Capillary 162 (*) 70 - 99 mg/dL    Comment 1 Notify RN     GLUCOSE, CAPILLARY     Status: Abnormal   Collection Time   10/16/11  6:04 PM      Component Value Range Comment   Glucose-Capillary 186 (*) 70 - 99 mg/dL   TSH     Status: Normal   Collection Time   10/16/11  8:09 PM      Component Value Range Comment   TSH 1.749  0.350 - 4.500 uIU/mL   CBC     Status: Abnormal   Collection Time   10/16/11  8:29 PM      Component Value Range Comment   WBC 12.2 (*) 4.0 - 10.5 K/uL    RBC 3.86 (*) 3.87 - 5.11 MIL/uL    Hemoglobin 10.0 (*) 12.0 - 15.0 g/dL    HCT 96.2 (*) 95.2 - 46.0 %    MCV 77.5 (*) 78.0 - 100.0 fL    MCH 25.9 (*) 26.0 - 34.0 pg    MCHC 33.4  30.0 - 36.0 g/dL    RDW 84.1  32.4 - 40.1 %    Platelets 369  150 - 400 K/uL   COMPREHENSIVE METABOLIC PANEL     Status: Abnormal   Collection Time   10/16/11  8:29 PM      Component Value Range Comment   Sodium 134 (*) 135 - 145 mEq/L    Potassium 3.8  3.5 - 5.1 mEq/L    Chloride 100  96 - 112 mEq/L    CO2 23  19 - 32 mEq/L    Glucose, Bld 204 (*) 70 - 99 mg/dL    BUN 15  6 - 23 mg/dL    Creatinine, Ser 0.27  0.50 - 1.10 mg/dL    Calcium 8.4  8.4 - 25.3 mg/dL    Total Protein 6.5  6.0 - 8.3 g/dL    Albumin 2.7 (*) 3.5 - 5.2 g/dL    AST 10  0 - 37 U/L    ALT 9  0 - 35 U/L    Alkaline Phosphatase 59  39 - 117 U/L    Total Bilirubin 0.2 (*) 0.3 - 1.2 mg/dL    GFR calc non Af Amer 74 (*) >90 mL/min    GFR calc Af Amer 86 (*) >90 mL/min   TROPONIN I     Status: Normal   Collection Time   10/16/11  8:30 PM      Component Value Range Comment   Troponin I <0.30  <0.30 ng/mL   BLOOD GAS, ARTERIAL     Status: Abnormal   Collection Time   10/16/11  9:42 PM      Component Value Range Comment   FIO2 0.21      pH, Arterial 7.458 (*) 7.350 - 7.450    pCO2 arterial 32.1 (*) 35.0 - 45.0 mmHg    pO2, Arterial 91.6  80.0 - 100.0 mmHg    Bicarbonate 22.5  20.0 - 24.0 mEq/L    TCO2 20.7  0 - 100 mmol/L    Acid-base deficit 0.5   0.0 - 2.0 mmol/L    O2 Saturation 97.4      Patient temperature 98.1  Collection site RIGHT RADIAL      Drawn by [phone]      Sample type ARTERIAL      Allens test (pass/fail) PASS  PASS   GLUCOSE, CAPILLARY     Status: Abnormal   Collection Time   10/16/11 10:03 PM      Component Value Range Comment   Glucose-Capillary 191 (*) 70 - 99 mg/dL    Comment 1 Notify RN     TROPONIN I     Status: Normal   Collection Time   10/17/11  3:15 AM      Component Value Range Comment   Troponin I <0.30  <0.30 ng/mL   CBC WITH DIFFERENTIAL     Status: Abnormal   Collection Time   10/17/11  3:15 AM      Component Value Range Comment   WBC 11.2 (*) 4.0 - 10.5 K/uL    RBC 4.15  3.87 - 5.11 MIL/uL    Hemoglobin 10.7 (*) 12.0 - 15.0 g/dL    HCT 21.3 (*) 08.6 - 46.0 %    MCV 77.1 (*) 78.0 - 100.0 fL    MCH 25.8 (*) 26.0 - 34.0 pg    MCHC 33.4  30.0 - 36.0 g/dL    RDW 57.8  46.9 - 62.9 %    Platelets 395  150 - 400 K/uL    Neutrophils Relative 55  43 - 77 %    Lymphocytes Relative 16  12 - 46 %    Monocytes Relative 4  3 - 12 %    Eosinophils Relative 25 (*) 0 - 5 %    Basophils Relative 0  0 - 1 %    Neutro Abs 6.2  1.7 - 7.7 K/uL    Lymphs Abs 1.8  0.7 - 4.0 K/uL    Monocytes Absolute 0.4  0.1 - 1.0 K/uL    Eosinophils Absolute 2.8 (*) 0.0 - 0.7 K/uL    Basophils Absolute 0.0  0.0 - 0.1 K/uL    Smear Review MORPHOLOGY UNREMARKABLE     URINE RAPID DRUG SCREEN (HOSP PERFORMED)     Status: Normal   Collection Time   10/17/11  4:00 AM      Component Value Range Comment   Opiates NONE DETECTED  NONE DETECTED    Cocaine NONE DETECTED  NONE DETECTED    Benzodiazepines NONE DETECTED  NONE DETECTED    Amphetamines NONE DETECTED  NONE DETECTED    Tetrahydrocannabinol NONE DETECTED  NONE DETECTED    Barbiturates NONE DETECTED  NONE DETECTED   URINALYSIS, MICROSCOPIC ONLY     Status: Abnormal   Collection Time   10/17/11  4:00 AM      Component Value Range Comment   Color, Urine YELLOW  YELLOW      APPearance CLOUDY (*) CLEAR    Specific Gravity, Urine 1.010  1.005 - 1.030    pH 6.5  5.0 - 8.0    Glucose, UA 500 (*) NEGATIVE mg/dL    Hgb urine dipstick LARGE (*) NEGATIVE    Bilirubin Urine NEGATIVE  NEGATIVE    Ketones, ur NEGATIVE  NEGATIVE mg/dL    Protein, ur NEGATIVE  NEGATIVE mg/dL    Urobilinogen, UA 1.0  0.0 - 1.0 mg/dL    Nitrite NEGATIVE  NEGATIVE    Leukocytes, UA TRACE (*) NEGATIVE    WBC, UA 0-2  <3 WBC/hpf    RBC / HPF TOO NUMEROUS TO COUNT  <3 RBC/hpf    Bacteria,  UA RARE  RARE    Squamous Epithelial / LPF RARE  RARE     Imaging / Studies: Ct Abdomen Pelvis Wo Contrast  10/15/2011  *RADIOLOGY REPORT*  Clinical Data: Upper abdominal pain with nausea vomiting.  Status post left.  The ventral hernia repair on 10/03/2011.  CT ABDOMEN AND PELVIS WITHOUT CONTRAST  Technique:  Multidetector CT imaging of the abdomen and pelvis was performed following the standard protocol without intravenous contrast.  Comparison: 07/30/2011  Findings: Small area of posterior airspace disease in the right base suggests pneumonia.  The liver measures 21.2 cm in cranial caudal length.  Liver and spleen show no focal abnormality on the study performed without intravenous contrast material.  The stomach, duodenum, pancreas, gallbladder, and adrenal glands are unremarkable.  Left kidney is atrophic with multiple areas of scarring.  Right kidney has unremarkable appearance.  No abdominal aortic aneurysm.  There is a trace amount of fluid anterior to the liver.  Imaging through the pelvis shows the patient to be status post ventral hernia repair with mesh placement.  Assessment of the ventral abdominal wall is limited by the lack of intravenous contrast material.  However, a 6.6 x 3.7 cm fluid collection is seen superficial to the rectus fascia about 11 mm caudal to the umbilicus and just anterior to the right iliac crest.  This fluid appears to track down into the right aspect of the  right rectus sheath  inferiorly.  There is some fluid anterior to the opacified, nondilated small bowel loops in the anterior right lower quadrant. While the axial images suggest that some of this fluid is immediately adjacent/around the small bowel loops, the sagittal re- formations suggest that most of the fluid is actually anterior to the small bowel loops and I suspect that it is extraperitoneal but that there may be some laxity in either the mesh or the peritoneum in this region which causes the appearance on the axial images.  If this was truly intraperitoneal fluid, I would expect some free flowing fluid elsewhere in the peritoneal cavity, but there is none.  Diffuse edema is seen within the subcutaneous fat of the lower abdomen and upper pelvis.  There is no evidence for a small bowel or colonic obstruction at this time.  Fibroid changes seen in the uterus.  No evidence for an adnexal mass.  Bone windows reveal no worrisome lytic or sclerotic osseous lesions.  IMPRESSION: Fluid collection is seen along the lower right margin of the ventral mesh with a focal area of fluid projecting superficial to the rectus fascia. The the fluid collection measures essentially water density and may be a seroma or old, liquefied hematoma. Superinfection of this fluid cannot be excluded by CT, but there is no evidence for gas within it.  Axial images suggest that some of the fluid surrounds small bowel loops, but this is less evident on the sagittal re-formations and I suspect that the fluid is all extraperitoneal but the appearance on the axial images is secondary to some laxity redundancy in the inferior mass/perineum.  However, I cannot completely exclude that there is some loculated anterior intraperitoneal component to the fluid.  No definite evidence for bowel herniation.  Although there are multiple small bowel loops in close proximity to the fluid in the anterior abdominal wall, there is no evidence for bowel obstruction, bowel wall  thickening, or other bowel complication at this time.  These findings were discussed with Dr. Biagio Quint at 2003 hours on 10/15/2011.  Original Report Authenticated By: ERIC A. MANSELL, M.D.    Dg Chest Port 1 View  10/16/2011  *RADIOLOGY REPORT*  Clinical Data: Chest pain  PORTABLE CHEST - 1 VIEW  Comparison: 11/11/2009  Findings: 2205 hours. The cardiopericardial silhouette is enlarged. The lungs are clear without focal infiltrate, edema, pneumothorax or pleural effusion. Imaged bony structures of the thorax are intact.  IMPRESSION: Enlargement the cardiopericardial silhouette without edema or focal lung consolidation.   Original Report Authenticated By: ERIC A. MANSELL, M.D.     Medications / Allergies: per chart  Antibiotics: Anti-infectives     Start     Dose/Rate Route Frequency Ordered Stop   10/15/11 2100   ertapenem (INVANZ) 1 g in sodium chloride 0.9 % 50 mL IVPB  Status:  Discontinued        1 g 100 mL/hr over 30 Minutes Intravenous Daily at bedtime 10/15/11 2013 10/16/11 0703

## 2011-10-17 NOTE — Progress Notes (Signed)
OT Cancellation Note  Treatment cancelled today due to medical issues with patient which prohibited therapy. Events noted in chart. Will cancel therapy for today and try back another time.  Roslin Norwood A OTR/L [phone] 10/17/2011, 7:50 AM

## 2011-10-18 DIAGNOSIS — R4182 Altered mental status, unspecified: Principal | ICD-10-CM

## 2011-10-18 LAB — GLUCOSE, CAPILLARY
Glucose-Capillary: 232 mg/dL — ABNORMAL HIGH (ref 70–99)
Glucose-Capillary: 201 mg/dL — ABNORMAL HIGH (ref 70–99)

## 2011-10-18 LAB — URINE CULTURE: Colony Count: 5000

## 2011-10-18 MED ORDER — INSULIN ASPART 100 UNIT/ML ~~LOC~~ SOLN
0.0000 [IU] | Freq: Three times a day (TID) | SUBCUTANEOUS | Status: DC
  Administered 2011-10-18 – 2011-10-19 (×4): 7 [IU] via SUBCUTANEOUS

## 2011-10-18 NOTE — Progress Notes (Signed)
Lennon EVELEAN BIGLER [id number] 1970-10-02   Subjective:  No major events Remains in Stepdown unit  Sorenes in abdomen much less No nausea.  Hungry Constipated  Objective:  Vital signs:  Filed Vitals:   10/17/11 2237 10/18/11 0000 10/18/11 0002 10/18/11 0410  BP: 142/86  142/69 140/71  Pulse: 91  84 81  Temp:  98.3 F (36.8 C)  98.1 F (36.7 C)  TempSrc:  Oral  Oral  Resp: 26  28 26   Height:      Weight:      SpO2: 99%  100% 98%    Last BM Date: 10/15/11  Intake/Output   Yesterday:  10/04 0701 - 10/05 0700 In: 1250 [I.V.:1250] Out: 1200 [Urine:1200] This shift:  Total I/O In: 450 [I.V.:450] Out: -   Bowel function:  Flatus: y  BM: n  Physical Exam:  General: Pt groggy but /oriented x4 in mild acute distress Eyes: PERRL, normal EOM.  Sclera clear.  No icterus Neuro: CN II-XII intact w/o focal sensory/motor deficits.  Mild spasticity RUE at first, then calms down Lymph: No head/neck/groin lymphadenopathy Psych:  No delerium/psychosis/paranoia.  Sleepy but arousable HENT: Normocephalic, Mucus membranes moist.  No thrush Neck: Supple, No tracheal deviation Chest: No chest wall pain w good excursion CV:  Pulses intact.  Regular rhythm Abdomen: Soft.  Nondistended.  Mild tender at LUQ 10mm port site only.  No incarcerated hernias.  No cellulitis.  Obese Ext:  SCDs BLE.  No mjr edema.  No cyanosis Skin: No petechiae / purpurae  Problem List:  Principal Problem:  *Abdominal pain, LUQ (left upper quadrant) Active Problems:  Right spastic hemiparesis  Sciatica  Hernia, incisional, RLQ, s/p lap repair Sep 2013  Obesity (BMI 30-39.9)  Mental status change  Leukocytosis  DM2 (diabetes mellitus, type 2)   Assessment  Troi M Nazzaro  41 y.o. female       Abd pain most likely from severe pull s/p fall - improving  MS changes with ? Presyncopal events   Plan:  -max non-narcotic pain control & changed to fentanyl/hydrocodone now -antiinflammatory  regimen -holding her home muscle relaxant -bowel regimen - Miralax.  Enema -retry PO -increased glc - inc SSI to resistant -medicine help appreciated - MRI pending -Neurology to see & offer insights -PT/OT eval for balance  -keep in SDUnit until OK by med & Neuro.   -VTE prophylaxis- SCDs, etc -mobilize as tolerated to help recovery  Ardeth Sportsman, M.D., F.A.C.S. Gastrointestinal and [facility]imally Invasive [facility] [address], [address] [place], [place] [address] [phone] Main / Paging [phone] Voice Mail   10/18/2011  CARE TEAM:  PCP: Michiel Sites, MD  Outpatient Care Team: Patient Care Team: Michiel Sites, MD as PCP - General Sheronette Cathie Beams, MD as Consulting Physician (Obstetrics and Gynecology) Micki Riley, MD as Consulting Physician (Neurology)  Inpatient Treatment Team: Treatment Team: Attending Provider: Ardeth Sportsman, MD; Registered Nurse: Patsey Berthold, RN; Registered Nurse: Tristan Schroeder, RN; Consulting Physician: Alison Murray, MD; Rounding Team: Alton Revere, MD; Registered Nurse: Vivien Rossetti, RN; Consulting Physician: Ritta Slot, MD; Consulting Physician: Kym Groom, MD   Results:   Labs: Results for orders placed during the hospital encounter of 10/15/11 (from the past 48 hour(s))  GLUCOSE, CAPILLARY     Status: Abnormal   Collection Time   10/16/11  7:52 AM      Component Value Range Comment   Glucose-Capillary 182 (*)  70 - 99 mg/dL   GLUCOSE, CAPILLARY     Status: Abnormal   Collection Time   10/16/11  9:28 AM      Component Value Range Comment   Glucose-Capillary 218 (*) 70 - 99 mg/dL   GLUCOSE, CAPILLARY     Status: Abnormal   Collection Time   10/16/11 11:50 AM      Component Value Range Comment   Glucose-Capillary 181 (*) 70 - 99 mg/dL   GLUCOSE, CAPILLARY     Status: Abnormal   Collection Time   10/16/11  5:08 PM      Component Value  Range Comment   Glucose-Capillary 162 (*) 70 - 99 mg/dL    Comment 1 Notify RN     GLUCOSE, CAPILLARY     Status: Abnormal   Collection Time   10/16/11  6:04 PM      Component Value Range Comment   Glucose-Capillary 186 (*) 70 - 99 mg/dL   TSH     Status: Normal   Collection Time   10/16/11  8:09 PM      Component Value Range Comment   TSH 1.749  0.350 - 4.500 uIU/mL   CBC     Status: Abnormal   Collection Time   10/16/11  8:29 PM      Component Value Range Comment   WBC 12.2 (*) 4.0 - 10.5 K/uL    RBC 3.86 (*) 3.87 - 5.11 MIL/uL    Hemoglobin 10.0 (*) 12.0 - 15.0 g/dL    HCT 96.0 (*) 45.4 - 46.0 %    MCV 77.5 (*) 78.0 - 100.0 fL    MCH 25.9 (*) 26.0 - 34.0 pg    MCHC 33.4  30.0 - 36.0 g/dL    RDW 09.8  11.9 - 14.7 %    Platelets 369  150 - 400 K/uL   COMPREHENSIVE METABOLIC PANEL     Status: Abnormal   Collection Time   10/16/11  8:29 PM      Component Value Range Comment   Sodium 134 (*) 135 - 145 mEq/L    Potassium 3.8  3.5 - 5.1 mEq/L    Chloride 100  96 - 112 mEq/L    CO2 23  19 - 32 mEq/L    Glucose, Bld 204 (*) 70 - 99 mg/dL    BUN 15  6 - 23 mg/dL    Creatinine, Ser 8.29  0.50 - 1.10 mg/dL    Calcium 8.4  8.4 - 56.2 mg/dL    Total Protein 6.5  6.0 - 8.3 g/dL    Albumin 2.7 (*) 3.5 - 5.2 g/dL    AST 10  0 - 37 U/L    ALT 9  0 - 35 U/L    Alkaline Phosphatase 59  39 - 117 U/L    Total Bilirubin 0.2 (*) 0.3 - 1.2 mg/dL    GFR calc non Af Amer 74 (*) >90 mL/min    GFR calc Af Amer 86 (*) >90 mL/min   TROPONIN I     Status: Normal   Collection Time   10/16/11  8:30 PM      Component Value Range Comment   Troponin I <0.30  <0.30 ng/mL   BLOOD GAS, ARTERIAL     Status: Abnormal   Collection Time   10/16/11  9:42 PM      Component Value Range Comment   FIO2 0.21      pH, Arterial 7.458 (*) 7.350 - 7.450  pCO2 arterial 32.1 (*) 35.0 - 45.0 mmHg    pO2, Arterial 91.6  80.0 - 100.0 mmHg    Bicarbonate 22.5  20.0 - 24.0 mEq/L    TCO2 20.7  0 - 100 mmol/L     Acid-base deficit 0.5  0.0 - 2.0 mmol/L    O2 Saturation 97.4      Patient temperature 98.1      Collection site RIGHT RADIAL      Drawn by [phone]      Sample type ARTERIAL      Allens test (pass/fail) PASS  PASS   GLUCOSE, CAPILLARY     Status: Abnormal   Collection Time   10/16/11 10:03 PM      Component Value Range Comment   Glucose-Capillary 191 (*) 70 - 99 mg/dL    Comment 1 Notify RN     TROPONIN I     Status: Normal   Collection Time   10/17/11  3:15 AM      Component Value Range Comment   Troponin I <0.30  <0.30 ng/mL   CBC WITH DIFFERENTIAL     Status: Abnormal   Collection Time   10/17/11  3:15 AM      Component Value Range Comment   WBC 11.2 (*) 4.0 - 10.5 K/uL    RBC 4.15  3.87 - 5.11 MIL/uL    Hemoglobin 10.7 (*) 12.0 - 15.0 g/dL    HCT 19.1 (*) 47.8 - 46.0 %    MCV 77.1 (*) 78.0 - 100.0 fL    MCH 25.8 (*) 26.0 - 34.0 pg    MCHC 33.4  30.0 - 36.0 g/dL    RDW 29.5  62.1 - 30.8 %    Platelets 395  150 - 400 K/uL    Neutrophils Relative 55  43 - 77 %    Lymphocytes Relative 16  12 - 46 %    Monocytes Relative 4  3 - 12 %    Eosinophils Relative 25 (*) 0 - 5 %    Basophils Relative 0  0 - 1 %    Neutro Abs 6.2  1.7 - 7.7 K/uL    Lymphs Abs 1.8  0.7 - 4.0 K/uL    Monocytes Absolute 0.4  0.1 - 1.0 K/uL    Eosinophils Absolute 2.8 (*) 0.0 - 0.7 K/uL    Basophils Absolute 0.0  0.0 - 0.1 K/uL    Smear Review MORPHOLOGY UNREMARKABLE     LIPID PANEL     Status: Abnormal   Collection Time   10/17/11  3:15 AM      Component Value Range Comment   Cholesterol 168  0 - 200 mg/dL    Triglycerides 657 (*) <150 mg/dL    HDL 28 (*) >84 mg/dL    Total CHOL/HDL Ratio 6.0      VLDL 59 (*) 0 - 40 mg/dL    LDL Cholesterol 81  0 - 99 mg/dL   HEMOGLOBIN O9G     Status: Abnormal   Collection Time   10/17/11  3:15 AM      Component Value Range Comment   Hemoglobin A1C 8.3 (*) <5.7 %    Mean Plasma Glucose 192 (*) <117 mg/dL   URINE RAPID DRUG SCREEN (HOSP PERFORMED)     Status:  Normal   Collection Time   10/17/11  4:00 AM      Component Value Range Comment   Opiates NONE DETECTED  NONE DETECTED    Cocaine NONE  DETECTED  NONE DETECTED    Benzodiazepines NONE DETECTED  NONE DETECTED    Amphetamines NONE DETECTED  NONE DETECTED    Tetrahydrocannabinol NONE DETECTED  NONE DETECTED    Barbiturates NONE DETECTED  NONE DETECTED   URINALYSIS, MICROSCOPIC ONLY     Status: Abnormal   Collection Time   10/17/11  4:00 AM      Component Value Range Comment   Color, Urine YELLOW  YELLOW    APPearance CLOUDY (*) CLEAR    Specific Gravity, Urine 1.010  1.005 - 1.030    pH 6.5  5.0 - 8.0    Glucose, UA 500 (*) NEGATIVE mg/dL    Hgb urine dipstick LARGE (*) NEGATIVE    Bilirubin Urine NEGATIVE  NEGATIVE    Ketones, ur NEGATIVE  NEGATIVE mg/dL    Protein, ur NEGATIVE  NEGATIVE mg/dL    Urobilinogen, UA 1.0  0.0 - 1.0 mg/dL    Nitrite NEGATIVE  NEGATIVE    Leukocytes, UA TRACE (*) NEGATIVE    WBC, UA 0-2  <3 WBC/hpf    RBC / HPF TOO NUMEROUS TO COUNT  <3 RBC/hpf    Bacteria, UA RARE  RARE    Squamous Epithelial / LPF RARE  RARE   GLUCOSE, CAPILLARY     Status: Abnormal   Collection Time   10/17/11  8:20 AM      Component Value Range Comment   Glucose-Capillary 212 (*) 70 - 99 mg/dL    Comment 1 Documented in Chart      Comment 2 Notify RN     GLUCOSE, CAPILLARY     Status: Abnormal   Collection Time   10/17/11 12:03 PM      Component Value Range Comment   Glucose-Capillary 220 (*) 70 - 99 mg/dL    Comment 1 Documented in Chart      Comment 2 Notify RN     GLUCOSE, CAPILLARY     Status: Abnormal   Collection Time   10/17/11  7:06 PM      Component Value Range Comment   Glucose-Capillary 260 (*) 70 - 99 mg/dL   GLUCOSE, CAPILLARY     Status: Abnormal   Collection Time   10/17/11  9:42 PM      Component Value Range Comment   Glucose-Capillary 232 (*) 70 - 99 mg/dL     Imaging / Studies: Dg Chest Port 1 View  10/16/2011  *RADIOLOGY REPORT*  Clinical Data: Chest  pain  PORTABLE CHEST - 1 VIEW  Comparison: 11/11/2009  Findings: 2205 hours. The cardiopericardial silhouette is enlarged. The lungs are clear without focal infiltrate, edema, pneumothorax or pleural effusion. Imaged bony structures of the thorax are intact.  IMPRESSION: Enlargement the cardiopericardial silhouette without edema or focal lung consolidation.   Original Report Authenticated By: ERIC A. MANSELL, M.D.     Medications / Allergies: per chart  Antibiotics: Anti-infectives     Start     Dose/Rate Route Frequency Ordered Stop   10/15/11 2100   ertapenem (INVANZ) 1 g in sodium chloride 0.9 % 50 mL IVPB  Status:  Discontinued        1 g 100 mL/hr over 30 Minutes Intravenous Daily at bedtime 10/15/11 2013 10/16/11 0703

## 2011-10-18 NOTE — Progress Notes (Signed)
Subjective: No complaints. The patient is looking forward to discharge.   Objective: Current vital signs: BP 154/80  Pulse 99  Temp 98.6 F (37 C) (Oral)  Resp 24  Ht 5\' 6"  (1.676 m)  Wt 114.2 kg (251 lb 12.3 oz)  BMI 40.64 kg/m2  SpO2 100%  LMP 10/15/2011 Vital signs in last 24 hours: Temp:  [97.8 F (36.6 C)-98.7 F (37.1 C)] 98.6 F (37 C) (10/05 2000) Pulse Rate:  [81-106] 99  (10/05 1800) Resp:  [18-28] 24  (10/05 1605) BP: (140-154)/(69-86) 154/80 mmHg (10/05 1605) SpO2:  [98 %-100 %] 100 % (10/05 1605)  Intake/Output from previous day: 10/04 0701 - 10/05 0700 In: 1350 [I.V.:1350] Out: 1200 [Urine:1200] Intake/Output this shift:   Nutritional status: Carb Control  Neurologic Exam: Mental Status: Awake, alert and fully oriented. Pleasant and cooperative.  Cranial Nerves: II- Fixates normally. Attends to visuall stimuli. Visual fields intact.  II/IV/VI- EOMI without nystagmus.  VII- Face symmetric.  VIII- Intact to conversation.  IX/X- No hypophonia, hoarseness or nasal quality to speech.  XI- Shoulders symmetric.  Motor: 5/5 x 4.  Cerebellar: Normal FNF bilaterally.   Lab Results: Results for orders placed during the hospital encounter of 10/15/11 (from the past 48 hour(s))  BLOOD GAS, ARTERIAL     Status: Abnormal   Collection Time   10/16/11  9:42 PM      Component Value Range Comment   FIO2 0.21      pH, Arterial 7.458 (*) 7.350 - 7.450    pCO2 arterial 32.1 (*) 35.0 - 45.0 mmHg    pO2, Arterial 91.6  80.0 - 100.0 mmHg    Bicarbonate 22.5  20.0 - 24.0 mEq/L    TCO2 20.7  0 - 100 mmol/L    Acid-base deficit 0.5  0.0 - 2.0 mmol/L    O2 Saturation 97.4      Patient temperature 98.1      Collection site RIGHT RADIAL      Drawn by [phone]      Sample type ARTERIAL      Allens test (pass/fail) PASS  PASS   GLUCOSE, CAPILLARY     Status: Abnormal   Collection Time   10/16/11 10:03 PM      Component Value Range Comment   Glucose-Capillary 191 (*) 70 -  99 mg/dL    Comment 1 Notify RN     TROPONIN I     Status: Normal   Collection Time   10/17/11  3:15 AM      Component Value Range Comment   Troponin I <0.30  <0.30 ng/mL   CBC WITH DIFFERENTIAL     Status: Abnormal   Collection Time   10/17/11  3:15 AM      Component Value Range Comment   WBC 11.2 (*) 4.0 - 10.5 K/uL    RBC 4.15  3.87 - 5.11 MIL/uL    Hemoglobin 10.7 (*) 12.0 - 15.0 g/dL    HCT 04.5 (*) 40.9 - 46.0 %    MCV 77.1 (*) 78.0 - 100.0 fL    MCH 25.8 (*) 26.0 - 34.0 pg    MCHC 33.4  30.0 - 36.0 g/dL    RDW 81.1  91.4 - 78.2 %    Platelets 395  150 - 400 K/uL    Neutrophils Relative 55  43 - 77 %    Lymphocytes Relative 16  12 - 46 %    Monocytes Relative 4  3 - 12 %  Eosinophils Relative 25 (*) 0 - 5 %    Basophils Relative 0  0 - 1 %    Neutro Abs 6.2  1.7 - 7.7 K/uL    Lymphs Abs 1.8  0.7 - 4.0 K/uL    Monocytes Absolute 0.4  0.1 - 1.0 K/uL    Eosinophils Absolute 2.8 (*) 0.0 - 0.7 K/uL    Basophils Absolute 0.0  0.0 - 0.1 K/uL    Smear Review MORPHOLOGY UNREMARKABLE     LIPID PANEL     Status: Abnormal   Collection Time   10/17/11  3:15 AM      Component Value Range Comment   Cholesterol 168  0 - 200 mg/dL    Triglycerides 161 (*) <150 mg/dL    HDL 28 (*) >09 mg/dL    Total CHOL/HDL Ratio 6.0      VLDL 59 (*) 0 - 40 mg/dL    LDL Cholesterol 81  0 - 99 mg/dL   HEMOGLOBIN U0A     Status: Abnormal   Collection Time   10/17/11  3:15 AM      Component Value Range Comment   Hemoglobin A1C 8.3 (*) <5.7 %    Mean Plasma Glucose 192 (*) <117 mg/dL   URINE RAPID DRUG SCREEN (HOSP PERFORMED)     Status: Normal   Collection Time   10/17/11  4:00 AM      Component Value Range Comment   Opiates NONE DETECTED  NONE DETECTED    Cocaine NONE DETECTED  NONE DETECTED    Benzodiazepines NONE DETECTED  NONE DETECTED    Amphetamines NONE DETECTED  NONE DETECTED    Tetrahydrocannabinol NONE DETECTED  NONE DETECTED    Barbiturates NONE DETECTED  NONE DETECTED   URINALYSIS,  MICROSCOPIC ONLY     Status: Abnormal   Collection Time   10/17/11  4:00 AM      Component Value Range Comment   Color, Urine YELLOW  YELLOW    APPearance CLOUDY (*) CLEAR    Specific Gravity, Urine 1.010  1.005 - 1.030    pH 6.5  5.0 - 8.0    Glucose, UA 500 (*) NEGATIVE mg/dL    Hgb urine dipstick LARGE (*) NEGATIVE    Bilirubin Urine NEGATIVE  NEGATIVE    Ketones, ur NEGATIVE  NEGATIVE mg/dL    Protein, ur NEGATIVE  NEGATIVE mg/dL    Urobilinogen, UA 1.0  0.0 - 1.0 mg/dL    Nitrite NEGATIVE  NEGATIVE    Leukocytes, UA TRACE (*) NEGATIVE    WBC, UA 0-2  <3 WBC/hpf    RBC / HPF TOO NUMEROUS TO COUNT  <3 RBC/hpf    Bacteria, UA RARE  RARE    Squamous Epithelial / LPF RARE  RARE   URINE CULTURE     Status: Normal   Collection Time   10/17/11  4:00 AM      Component Value Range Comment   Specimen Description URINE, RANDOM      Special Requests NONE      Culture  Setup Time 10/17/2011 12:23      Colony Count 5,000 COLONIES/ML      Culture INSIGNIFICANT GROWTH      Report Status 10/18/2011 FINAL     GLUCOSE, CAPILLARY     Status: Abnormal   Collection Time   10/17/11  8:20 AM      Component Value Range Comment   Glucose-Capillary 212 (*) 70 - 99 mg/dL    Comment 1  Documented in Chart      Comment 2 Notify RN     GLUCOSE, CAPILLARY     Status: Abnormal   Collection Time   10/17/11 12:03 PM      Component Value Range Comment   Glucose-Capillary 220 (*) 70 - 99 mg/dL    Comment 1 Documented in Chart      Comment 2 Notify RN     GLUCOSE, CAPILLARY     Status: Abnormal   Collection Time   10/17/11  7:06 PM      Component Value Range Comment   Glucose-Capillary 260 (*) 70 - 99 mg/dL   GLUCOSE, CAPILLARY     Status: Abnormal   Collection Time   10/17/11  9:42 PM      Component Value Range Comment   Glucose-Capillary 232 (*) 70 - 99 mg/dL   GLUCOSE, CAPILLARY     Status: Abnormal   Collection Time   10/18/11  8:23 AM      Component Value Range Comment   Glucose-Capillary 209  (*) 70 - 99 mg/dL   GLUCOSE, CAPILLARY     Status: Abnormal   Collection Time   10/18/11 12:04 PM      Component Value Range Comment   Glucose-Capillary 239 (*) 70 - 99 mg/dL   GLUCOSE, CAPILLARY     Status: Abnormal   Collection Time   10/18/11  4:13 PM      Component Value Range Comment   Glucose-Capillary 201 (*) 70 - 99 mg/dL    Comment 1 Documented in Chart      Comment 2 Notify RN       Recent Results (from the past 240 hour(s))  CULTURE, BLOOD (ROUTINE X 2)     Status: Normal (Preliminary result)   Collection Time   10/15/11  8:29 PM      Component Value Range Status Comment   Specimen Description BLOOD RIGHT ARM   Final    Special Requests BOTTLES DRAWN AEROBIC AND ANAEROBIC 5CC   Final    Culture  Setup Time 10/16/2011 02:40   Final    Culture     Final    Value:        BLOOD CULTURE RECEIVED NO GROWTH TO DATE CULTURE WILL BE HELD FOR 5 DAYS BEFORE ISSUING A FINAL NEGATIVE REPORT   Report Status PENDING   Incomplete   CULTURE, BLOOD (ROUTINE X 2)     Status: Normal (Preliminary result)   Collection Time   10/15/11  8:31 PM      Component Value Range Status Comment   Specimen Description BLOOD RIGHT HAND   Final    Special Requests BOTTLES DRAWN AEROBIC ONLY 3CC   Final    Culture  Setup Time 10/16/2011 02:40   Final    Culture     Final    Value:        BLOOD CULTURE RECEIVED NO GROWTH TO DATE CULTURE WILL BE HELD FOR 5 DAYS BEFORE ISSUING A FINAL NEGATIVE REPORT   Report Status PENDING   Incomplete   URINE CULTURE     Status: Normal   Collection Time   10/17/11  4:00 AM      Component Value Range Status Comment   Specimen Description URINE, RANDOM   Final    Special Requests NONE   Final    Culture  Setup Time 10/17/2011 12:23   Final    Colony Count 5,000 COLONIES/ML   Final    Culture  INSIGNIFICANT GROWTH   Final    Report Status 10/18/2011 FINAL   Final     Lipid Panel  Basename 10/17/11 0315  CHOL 168  TRIG 294*  HDL 28*  CHOLHDL 6.0  VLDL 59*  LDLCALC  81    Studies/Results: Mr Brain Wo Contrast  10/18/2011  *RADIOLOGY REPORT*  Clinical Data: Spastic hemiplegia.  History of previous infarction. Severe headache.  Syncopal episodes.  MRI HEAD WITHOUT CONTRAST  Technique:  Multiplanar, multiecho pulse sequences of the brain and surrounding structures were obtained according to standard protocol without intravenous contrast.  Comparison: Head CT 08/09/2009.  MRI 06/16/2009.  Findings: Diffusion imaging does not show any acute or subacute infarction.  There are old infarctions in the pons (left worse than right), left basal ganglia and the left radiating white matter tracts.  Other small vessel infarctions are present within the cerebral hemispheric white matter.  No mass lesion, hemorrhage, hydrocephalus or extra-axial collection.  No pituitary mass.  No inflammatory sinus disease.  No skull or skull base lesion.  IMPRESSION: No acute finding.  Old infarctions affecting the pons, left basal ganglia and bilateral hemispheric white matter.   Original Report Authenticated By: Thomasenia Sales, M.D.    Dg Chest Port 1 View  10/16/2011  *RADIOLOGY REPORT*  Clinical Data: Chest pain  PORTABLE CHEST - 1 VIEW  Comparison: 11/11/2009  Findings: 2205 hours. The cardiopericardial silhouette is enlarged. The lungs are clear without focal infiltrate, edema, pneumothorax or pleural effusion. Imaged bony structures of the thorax are intact.  IMPRESSION: Enlargement the cardiopericardial silhouette without edema or focal lung consolidation.   Original Report Authenticated By: Talayeh Bruinsma A. MANSELL, M.D.     Medications:  Scheduled:   . amLODipine  5 mg Oral Daily  . aspirin  81 mg Oral Daily  . gabapentin  300 mg Oral TID  . haloperidol lactate      . haloperidol lactate  1 mg Intravenous Once  . insulin aspart  0-20 Units Subcutaneous TID WC  . insulin glargine  10 Units Subcutaneous QHS  . insulin glargine  11 Units Subcutaneous Daily  . irbesartan  150 mg Oral Daily    . lip balm  1 application Topical BID  . naproxen  500 mg Oral BID WC  . polyethylene glycol  17 g Oral BID  . sodium chloride  500 mL Intravenous Once  . DISCONTD: insulin aspart  0-15 Units Subcutaneous TID WC   WUJ:[id number], acetaminophen, alum & mag hydroxide-simeth, bisacodyl, diphenhydrAMINE, fentaNYL, HYDROcodone-acetaminophen, lactated ringers, magic mouthwash, magnesium hydroxide, ondansetron, QUEtiapine, DISCONTD: LORazepam  Assessment/Plan:  Assessment: 41 year old F with a history of pontine infarct who presents with three episodes of concious feeling of being asleep and unable to move. Differential would include sedation from medication(most likely), also possible are cataplexy (though no history of narcoplepsy) or conversion disorder. Her headaches with sudden weakness afterwards are unusual, but may represent migraine variant. She has been started on gabapentin by her neurologist for this. MRI reveals old left pontine and left basal ganglia lacunar infarctions. No findings suggestive of an epileptogenic focus are seen on my review of the MRI.   Recommendations:  1) Continue gabapentin as headache prophylaxis.  2) If she has not had a stroke work up in the past, would obtain carotid ultrasound, MRA of head and TTE. Continue ASA.  3) Would not start anti-epileptic therapy at this time. There is a low index of suspicion for seizures and her EEG is normal.  4) Continue to limit sedating medications 5) Stable for transfer to a lower acuity floor or for home discharge, from a neurological standpoint. Recommend outpatient neurology follow up.   Thank you for allowing me to participate in her care. Please call neurology if there are any additional questions.    LOS: 3 days   @Electronically  signed: Dr. Caryl Pina 10/18/2011  8:42 PM

## 2011-10-18 NOTE — Consult Note (Signed)
[facility] PROGRESS NOTE  Tina [name] WUJ:[id number] DOB: 12-Nov-1970 DOA: 10/15/2011 PCP: Michiel Sites, MD  Brief narrative: Requesting physician: Abbey Chatters  Date of consultation: 10/16/11  Reason for consultation: altered mental status   Brief narrative:  41 year old female with multiple medical problems including hypertension, stroke, anxiety, and diabetes mellitus was admitted to the hospital on 10/15/2011 status post fall and abdominal pain.   Impression/Recommendations  Acute mental status change  - perhaps related to opioid use however not entirely clear why she had syncopal event 2 nights ago - patient is alert and oriented today completely and desires to go home today - troponin x 3 negative  - MRI brain negative for acute intracranial findings - CBG's under relatively good control  - UDS negative  - follow up PT evaluation although patient seems clinically stable and less likely would PT recommend any follow up Hypertension  -Restarted amlodipine and valsartan and this may be continued on discharge Diabetes mellitus type 2  - continue current insulin regimen  History of stroke  -Restarted aspirin  Leukocytosis  - urinalysis negative   Code Status: full code  Family Communication: at bedside  Disposition Plan: per primary team; [facility] will sign off  [name], MD  [facility]  Pager [phone]   HPI/Subjective: No acute events overnight.  Objective: Filed Vitals:   10/17/11 2237 10/18/11 0000 10/18/11 0002 10/18/11 0410  BP: 142/86  142/69 140/71  Pulse: 91  84 81  Temp:  98.3 F (36.8 C)  98.1 F (36.7 C)  TempSrc:  Oral  Oral  Resp: 26  28 26   Height:      Weight:      SpO2: 99%  100% 98%    Intake/Output Summary (Last 24 hours) at 10/18/11 0729 Last data filed at 10/18/11 0500  Gross per 24 hour  Intake   1250 ml  Output   1200 ml  Net     50 ml    Exam:   General:  Pt is alert, follows commands appropriately, not in acute  distress  Cardiovascular: Regular rate and rhythm, S1/S2, no murmurs, no rubs, no gallops  Respiratory: Clear to auscultation bilaterally, no wheezing, no crackles, no rhonchi  Abdomen: Soft, non tender, non distended, bowel sounds present, no guarding  Extremities: No edema, pulses DP and PT palpable bilaterally  Neuro: Grossly nonfocal  Data Reviewed: Basic Metabolic Panel:  Lab 10/16/11 8657 10/15/11 1753 10/14/11 2021  NA 134* 136 137  K 3.8 4.3 4.0  CL 100 102 101  CO2 23 22 22   GLUCOSE 204* 235* 205*  BUN 15 14 23   CREATININE 0.94 0.80 1.15*  CALCIUM 8.4 9.5 9.4   Liver Function Tests:  Lab 10/16/11 2029 10/15/11 1753 10/14/11 2021  AST 10 11 14   ALT 9 11 12   ALKPHOS 59 71 72  BILITOT 0.2* 0.2* 0.2*  PROT 6.5 7.6 7.7  ALBUMIN 2.7* 3.1* 3.1*    Lab 10/15/11 1753  LIPASE 34  AMYLASE --   CBC:  Lab 10/17/11 0315 10/16/11 2029 10/15/11 1753 10/14/11 2021  WBC 11.2* 12.2* 12.6* 11.5*  HGB 10.7* 10.0* 11.0* 10.6*  HCT 32.0* 29.9* 33.0* 32.2*  MCV 77.1* 77.5* 78.9 78.9  PLT 395 369 408* 420*   Cardiac Enzymes:  Lab 10/17/11 0315 10/16/11 2030  CKTOTAL -- --  CKMB -- --  CKMBINDEX -- --  TROPONINI <0.30 <0.30   CBG:  Lab 10/17/11 2142 10/17/11 1906 10/17/11 1203 10/17/11 0820 10/16/11 2203  GLUCAP 232*  260* 220* 212* 191*    Recent Results (from the past 240 hour(s))  CULTURE, BLOOD (ROUTINE X 2)     Status: Normal (Preliminary result)   Collection Time   10/15/11  8:29 PM      Component Value Range Status Comment   Specimen Description BLOOD RIGHT ARM   Final    Special Requests BOTTLES DRAWN AEROBIC AND ANAEROBIC 5CC   Final    Culture  Setup Time 10/16/2011 02:40   Final    Culture     Final    Value:        BLOOD CULTURE RECEIVED NO GROWTH TO DATE CULTURE WILL BE HELD FOR 5 DAYS BEFORE ISSUING A FINAL NEGATIVE REPORT   Report Status PENDING   Incomplete   CULTURE, BLOOD (ROUTINE X 2)     Status: Normal (Preliminary result)   Collection Time     10/15/11  8:31 PM      Component Value Range Status Comment   Specimen Description BLOOD RIGHT HAND   Final    Special Requests BOTTLES DRAWN AEROBIC ONLY 3CC   Final    Culture  Setup Time 10/16/2011 02:40   Final    Culture     Final    Value:        BLOOD CULTURE RECEIVED NO GROWTH TO DATE CULTURE WILL BE HELD FOR 5 DAYS BEFORE ISSUING A FINAL NEGATIVE REPORT   Report Status PENDING   Incomplete      Studies: Dg Chest Port 1 View 10/16/2011  * IMPRESSION: Enlargement the cardiopericardial silhouette without edema or focal lung consolidation.      Scheduled Meds:   . amLODipine  5 mg Oral Daily  . aspirin  81 mg Oral Daily  . gabapentin  300 mg Oral TID  . haloperidol lactate      . haloperidol lactate  1 mg Intravenous Once  . haloperidol lactate  1 mg Intravenous Once  . insulin aspart  0-20 Units Subcutaneous TID WC  . insulin glargine  10 Units Subcutaneous QHS  . insulin glargine  11 Units Subcutaneous Daily  . irbesartan  150 mg Oral Daily  . lip balm  1 application Topical BID  . naproxen  500 mg Oral BID WC  . polyethylene glycol  17 g Oral BID  . sodium chloride  500 mL Intravenous Once  . DISCONTD: gabapentin  400 mg Oral TID  . DISCONTD: insulin aspart  0-15 Units Subcutaneous TID WC  . DISCONTD: psyllium  1 packet Oral BID   Continuous Infusions:   . sodium chloride 50 mL/hr at 10/17/11 1400

## 2011-10-19 DIAGNOSIS — Z8669 Personal history of other diseases of the nervous system and sense organs: Secondary | ICD-10-CM

## 2011-10-19 HISTORY — DX: Personal history of other diseases of the nervous system and sense organs: Z86.69

## 2011-10-19 LAB — GLUCOSE, CAPILLARY: Glucose-Capillary: 214 mg/dL — ABNORMAL HIGH (ref 70–99)

## 2011-10-19 MED ORDER — TRAMADOL HCL 50 MG PO TABS: 50.0000 mg | ORAL_TABLET | Freq: Four times a day (QID) | ORAL | Status: DC | PRN

## 2011-10-19 NOTE — Discharge Summary (Signed)
Physician Discharge Summary  Patient ID: Tina [name] MRN: [id number] DOB/AGE: 42/17/1972 42 y.o.  Admit date: 10/15/2011 Discharge date: 10/19/2011  Admission Diagnoses: Principal Problem:  *Abdominal pain, LUQ (left upper quadrant) Active Problems:  Right spastic hemiparesis  Sciatica  Hernia, incisional, RLQ, s/p lap repair Sep 2013  Obesity (BMI 30-39.9)  Mental status change  Leukocytosis  DM2 (diabetes mellitus, type 2)  Discharge Diagnoses:  Principal Problem:  *Abdominal pain, LUQ (left upper quadrant) Active Problems:  Right spastic hemiparesis  Sciatica  Hernia, incisional, RLQ, s/p lap repair Sep 2013  Obesity (BMI 30-39.9)  Mental status change  Leukocytosis  DM2 (diabetes mellitus, type 2)   Discharged Condition: good  Hospital Course: Obese female with a history of right hemiparesis.  Underwent lap ventral hernia a while ago.  Went home.  Fell down at home and felt sharp pain onher left side.  Very intense.  It was admitted.  Pain felt better.  However, on the following day she became more sedated and less responsive.  Was transferred to the step down unit.  A medical and neurological consultations were made.  Heat he negative.  MRI negative for any new problems.  Workup most consistent with over sedation.  The patient mobilized and advanced to a solid diet gradually.  Pain was well-controlled and transitioned off IV medications.    By the time of discharge, the patient was walking well the hallways, eating food well, having flatus.  Pain was-controlled on an oral regimen.  Based on meeting DC criteria and recovering well, I felt it was safe for the patient to be discharged home with close followup.  Instructions were discussed in detail.  They are written as well.  Consults: neurology and medicine  Significant Diagnostic Studies: EEG and MRI  Treatments:   Discharge Exam: Blood pressure 148/62, pulse 93, temperature 98.4 F (36.9 C), temperature  source Oral, resp. rate 18, height 5\' 6"  (1.676 m), weight 251 lb 12.3 oz (114.2 kg), last menstrual period 10/15/2011, SpO2 100.00%.  General: Pt awake/alert/oriented x4 in no major acute distress.  Smiling alert husband in room Eyes: PERRL, normal EOM. Sclera nonicteric Neuro: CN II-XII intact w/o focal sensory/motor deficits. Lymph: No head/neck/groin lymphadenopathy Psych:  No delerium/psychosis/paranoia HENT: Normocephalic, Mucus membranes moist.  No thrush Neck: Supple, No tracheal deviation Chest: No pain.  Good respiratory excursion. CV:  Pulses intact.  Regular rhythm Abdomen: Soft, Nondistended.  Minimally tender.  No incarcerated hernias. Ext:  SCDs BLE.  No significant edema.  No cyanosis Skin: No petechiae / purpurae   Disposition: 01-Home or Self Care  Discharge Orders    Future Appointments: Provider: Department: Dept Phone: Center:   10/23/2011 4:45 PM Ardeth Sportsman, MD [facility] [phone] None     Future Orders Please Complete By Expires   Diet - low sodium heart healthy      Increase activity slowly          Medication List     As of 10/19/2011  7:58 AM    TAKE these medications         aspirin 325 MG tablet   Take 325 mg by mouth daily.      EXFORGE HCT 10-320-25 MG Tabs   Generic drug: Amlodipine-Valsartan-HCTZ   Take 1 tablet by mouth every evening.      furosemide 40 MG tablet   Commonly known as: LASIX   Take 20-40 mg by mouth daily.      gabapentin 300 MG capsule  Commonly known as: NEURONTIN   Take 300 mg by mouth 3 (three) times daily.      insulin glargine 100 UNIT/ML injection   Commonly known as: LANTUS   Inject 20-22 Units into the skin 2 (two) times daily. Inject 22 units in the morning and 20 units in the evening      insulin lispro 100 UNIT/ML injection   Commonly known as: HUMALOG   Inject 6-10 Units into the skin 3 (three) times daily before meals. Pt on sliding scale      multivitamin with minerals Tabs   Take 1  tablet by mouth daily.      promethazine 12.5 MG tablet   Commonly known as: PHENERGAN   Take 12.5 mg by mouth every 6 (six) hours as needed. For nausea      tiZANidine 4 MG capsule   Commonly known as: ZANAFLEX   Take 4 mg by mouth at bedtime.      traMADol 50 MG tablet   Commonly known as: ULTRAM   Take 1 tablet (50 mg total) by mouth every 6 (six) hours as needed for pain. For pain      ziprasidone 80 MG capsule   Commonly known as: GEODON   Take 80 mg by mouth daily.           Follow-up Information    Follow up with Lyndzie Zentz C., MD. Schedule an appointment as soon as possible for a visit in 2 weeks.   Contact information:   [address] [place] [address] [phone]       Follow up with Gates Rigg, MD. Schedule an appointment as soon as possible for a visit in 1 week.   Contact information:   [address], [address] [facility] [place] [address] [phone]          Signed: Meha Vidrine C. 10/19/2011, 7:58 AM

## 2011-10-20 NOTE — Progress Notes (Signed)
Discharge summary sent to payer through [facility]  

## 2011-10-21 ENCOUNTER — Telehealth (INDEPENDENT_AMBULATORY_CARE_PROVIDER_SITE_OTHER): Payer: Self-pay

## 2011-10-21 NOTE — Telephone Encounter (Signed)
Pt's husband has tried to schedule an appt for his wife to see Dr. Pearlean Brownie (Neurology) for one week after surgery.  They have left multiple messages with his office and have received no response.  Please contact for her.

## 2011-10-22 LAB — CULTURE, BLOOD (ROUTINE X 2): Culture: NO GROWTH

## 2011-10-22 NOTE — Telephone Encounter (Signed)
LMOM for Dr Marlis Edelson nurse checking on status of appt with them. I asked they give me a call back today. Patient was consulted on in the hospital for neurological symptoms with history of a stroke. Needs follow up with Dr Pearlean Brownie. Awaiting call back.

## 2011-10-23 ENCOUNTER — Encounter (INDEPENDENT_AMBULATORY_CARE_PROVIDER_SITE_OTHER): Payer: Federal, State, Local not specified - PPO | Admitting: Surgery

## 2011-10-23 NOTE — Telephone Encounter (Signed)
Spoke with Rashida at Dr Marlis Edelson office. She called patient this am and offered an appt at the end of October which patient refused because she was told to follow up in a week. Rashida is to review notes with Dr Pearlean Brownie and have him decide when he needs to see patient. They will call patient back with an appt. Spoke with patient and she confirmed. She will call us if needed.

## 2011-10-23 NOTE — Telephone Encounter (Signed)
Left another message with Dr Marlis Edelson RN to contact me back about an appt.

## 2011-11-03 ENCOUNTER — Ambulatory Visit (INDEPENDENT_AMBULATORY_CARE_PROVIDER_SITE_OTHER): Payer: Federal, State, Local not specified - PPO | Admitting: Surgery

## 2011-11-03 ENCOUNTER — Encounter (INDEPENDENT_AMBULATORY_CARE_PROVIDER_SITE_OTHER): Payer: Self-pay | Admitting: Surgery

## 2011-11-03 VITALS — BP 148/80 | HR 99 | Temp 98.6°F | Ht 66.5 in | Wt 248.4 lb

## 2011-11-03 DIAGNOSIS — R1012 Left upper quadrant pain: Secondary | ICD-10-CM

## 2011-11-03 DIAGNOSIS — E669 Obesity, unspecified: Secondary | ICD-10-CM

## 2011-11-03 DIAGNOSIS — K432 Incisional hernia without obstruction or gangrene: Secondary | ICD-10-CM

## 2011-11-03 NOTE — Progress Notes (Signed)
Subjective:     Patient ID: Tina [name], female   DOB: [birth year], 41 y.o.   MRN: [id number]  HPI  Tina [name]  Nov 14, 1970 [id number]  Patient Care Team: [name] Needle, MD as PCP - General Sheronette Cathie Beams, MD as Consulting Physician (Obstetrics and Gynecology) Micki Riley, MD as Consulting Physician (Neurology)  This patient is a 41 y.o.female who presents today for surgical evaluation.   Patient is now a month after repair of ventral hernia.  She had issues of confusion and weakness that required readmission.  MRI and workup did not show any major changes.  She gradually improved.  At home.  Since she has been home she is feeling better.  Weaning off narcotics.  Using tramadol sparingly.  Using Tylenol.  Walking more.  Prefers abdominal binder.  Using a bowel regimen to avoid constipation.  Energy level a little bit better.  Not able to get into see the neurologist yet.  She is hopeful that means that nothing serious is going on.  Patient Active Problem List  Diagnosis  . Right spastic hemiparesis  . Lumbago  . Paresthesias  . Sciatica  . Paresthesias in right hand  . Rotator cuff tear  . Hernia, incisional, RLQ, s/p lap repair Sep 2013  . Obesity (BMI 30-39.9)  . Abdominal pain, LUQ (left upper quadrant)  . Mental status change  . Leukocytosis  . DM2 (diabetes mellitus, type 2)  . Hx of migraines    Past Medical History  Diagnosis Date  . Flaccid hemiplegia affecting dominant side   . Enthesopathy of hip region     RIGHT HIP PAIN WITH PAIN DOWN RIGHT LEG  . Lumbago     LOWER BACK  . Spastic hemiplegia affecting dominant side   . Panic disorder without agoraphobia   . Depression   . Anxiety   . Diabetes mellitus   . Hypertension   . Blood transfusion     IN 2012  AFTER C -SECTION  . Cerebral thrombosis with cerebral infarction JUNE 2011    RIGHT SIDED WEAKNESS ( ARM AND LEG ) AND SPASMS  . Stroke   . Right rotator cuff tear     PAIN IN RIGHT  SHOULDER  . Ventral hernia     RIGHT LOWER QUADRANT-CAUSING SOME PAIN  . Cough     STARTED 09/24/11--NO OTHER SYMPTOMS.  Marland Kitchen Anemia     DURING MENSES--HAS HEAVY BLEEDING WITH PERODS  . Headache     MIGRAINES--NOT REALLY HEADACHE-MORE LIKE PRESSURE SENSATION IN HEAD-FEELS DIZZIY AND  FAINT AS THE PRESSURE RESOLVES  . Diabetic neuropathy     BOTH FEET --COMES AND GOES  . Restless leg syndrome     DIAGNOSED BY SLEEP STUDY - PT TOLD SHE DID NOT HAVE SLEEP APNEA  . Rash     HANDS, ARMS --STATES HX OF RASH EVER SINCE CHILDBIRTH/PREGNANCY.  STATES THE RASH OFTEN OCCURS WHEN SHE IS REALLY STRESSED.    Past Surgical History  Procedure Date  . Cesarean section 2012  . Uterine fibroid surgery     2 SURGERIES FOR FIBROIDS  . Ureter revision   . Ventral hernia repair 10/03/2011    Procedure: LAPAROSCOPIC VENTRAL HERNIA;  Surgeon: Ardeth Sportsman, MD;  Location: [facility];  Service: General;  Laterality: N/A;  . Hernia repair 10/03/11    ventral hernia repair    History   Social History  . Marital Status: Married    Spouse Name: N/A    Number  of Children: N/A  . Years of Education: N/A   Occupational History  . Not on file.   Social History Main Topics  . Smoking status: Never Smoker   . Smokeless tobacco: Never Used  . Alcohol Use: No  . Drug Use: No  . Sexually Active: Yes    Birth Control/ Protection: None   Other Topics Concern  . Not on file   Social History Narrative  . No narrative on file    Family History  Problem Relation Age of Onset  . Diabetes Father   . Kidney disease Father   . Other Neg Hx   . Diabetes Maternal Grandmother   . Hyperlipidemia Paternal Grandmother   . Stroke Paternal Grandmother     Current Outpatient Prescriptions  Medication Sig Dispense Refill  . aspirin 325 MG tablet Take 325 mg by mouth daily.       . B-D INS SYR ULTRAFINE .3CC/30G 30G X 1/2" 0.3 ML MISC       . B-D ULTRAFINE III SHORT PEN 31G X 8 MM MISC       . EXFORGE HCT  10-320-25 MG TABS Take 1 tablet by mouth every evening.       . furosemide (LASIX) 40 MG tablet Take 20-40 mg by mouth daily.      Marland Kitchen gabapentin (NEURONTIN) 300 MG capsule Take 300 mg by mouth 3 (three) times daily.      . insulin glargine (LANTUS) 100 UNIT/ML injection Inject 20-22 Units into the skin 2 (two) times daily. Inject 22 units in the morning and 20 units in the evening      . insulin lispro (HUMALOG) 100 UNIT/ML injection Inject 6-10 Units into the skin 3 (three) times daily before meals. Pt on sliding scale      . Multiple Vitamin (MULTIVITAMIN WITH MINERALS) TABS Take 1 tablet by mouth daily.      . promethazine (PHENERGAN) 12.5 MG tablet Take 12.5 mg by mouth every 6 (six) hours as needed. For nausea      . tiZANidine (ZANAFLEX) 4 MG capsule Take 4 mg by mouth at bedtime.      . traMADol (ULTRAM) 50 MG tablet Take 1 tablet (50 mg total) by mouth every 6 (six) hours as needed for pain. For pain  30 tablet  0  . ziprasidone (GEODON) 80 MG capsule Take 80 mg by mouth daily.         Allergies  Allergen Reactions  . Contrast Media (Iodinated Diagnostic Agents) Other (See Comments)    Difficulty breathing  . Iohexol Hives, Nausea And Vomiting and Swelling     Desc: Magnevist-gadolinium-difficulty breathing, throat swelling   . Midazolam Hcl Anaphylaxis    Difficulty breathing  . Shellfish Allergy Anaphylaxis  . Avandia (Rosiglitazone Maleate)     Patient doesn't remember reaction  . Kiwi Extract Itching and Swelling  . Metformin And Related Nausea And Vomiting and Other (See Comments)    diarrhea  . Other Itching    Patient is allergic to all nuts except peanuts.     BP 148/80  Pulse 99  Temp 98.6 F (37 C) (Temporal)  Ht 5' 6.5" (1.689 m)  Wt 248 lb 6.4 oz (112.674 kg)  BMI 39.49 kg/m2  SpO2 98%  LMP 10/15/2011  Ct Abdomen Pelvis Wo Contrast  10/15/2011  *RADIOLOGY REPORT*  Clinical Data: Upper abdominal pain with nausea vomiting.  Status post left.  The ventral  hernia repair on 10/03/2011.  CT ABDOMEN AND PELVIS  WITHOUT CONTRAST  Technique:  Multidetector CT imaging of the abdomen and pelvis was performed following the standard protocol without intravenous contrast.  Comparison: 07/30/2011  Findings: Small area of posterior airspace disease in the right base suggests pneumonia.  The liver measures 21.2 cm in cranial caudal length.  Liver and spleen show no focal abnormality on the study performed without intravenous contrast material.  The stomach, duodenum, pancreas, gallbladder, and adrenal glands are unremarkable.  Left kidney is atrophic with multiple areas of scarring.  Right kidney has unremarkable appearance.  No abdominal aortic aneurysm.  There is a trace amount of fluid anterior to the liver.  Imaging through the pelvis shows the patient to be status post ventral hernia repair with mesh placement.  Assessment of the ventral abdominal wall is limited by the lack of intravenous contrast material.  However, a 6.6 x 3.7 cm fluid collection is seen superficial to the rectus fascia about 11 mm caudal to the umbilicus and just anterior to the right iliac crest.  This fluid appears to track down into the right aspect of the  right rectus sheath inferiorly.  There is some fluid anterior to the opacified, nondilated small bowel loops in the anterior right lower quadrant. While the axial images suggest that some of this fluid is immediately adjacent/around the small bowel loops, the sagittal re- formations suggest that most of the fluid is actually anterior to the small bowel loops and I suspect that it is extraperitoneal but that there may be some laxity in either the mesh or the peritoneum in this region which causes the appearance on the axial images.  If this was truly intraperitoneal fluid, I would expect some free flowing fluid elsewhere in the peritoneal cavity, but there is none.  Diffuse edema is seen within the subcutaneous fat of the lower abdomen and upper  pelvis.  There is no evidence for a small bowel or colonic obstruction at this time.  Fibroid changes seen in the uterus.  No evidence for an adnexal mass.  Bone windows reveal no worrisome lytic or sclerotic osseous lesions.  IMPRESSION: Fluid collection is seen along the lower right margin of the ventral mesh with a focal area of fluid projecting superficial to the rectus fascia. The the fluid collection measures essentially water density and may be a seroma or old, liquefied hematoma. Superinfection of this fluid cannot be excluded by CT, but there is no evidence for gas within it.  Axial images suggest that some of the fluid surrounds small bowel loops, but this is less evident on the sagittal re-formations and I suspect that the fluid is all extraperitoneal but the appearance on the axial images is secondary to some laxity redundancy in the inferior mass/perineum.  However, I cannot completely exclude that there is some loculated anterior intraperitoneal component to the fluid.  No definite evidence for bowel herniation.  Although there are multiple small bowel loops in close proximity to the fluid in the anterior abdominal wall, there is no evidence for bowel obstruction, bowel wall thickening, or other bowel complication at this time.  These findings were discussed with Dr. Biagio Quint at 2003 hours on 10/15/2011.   Original Report Authenticated By: ERIC A. MANSELL, M.D.    Mr Brain Wo Contrast  10/18/2011  *RADIOLOGY REPORT*  Clinical Data: Spastic hemiplegia.  History of previous infarction. Severe headache.  Syncopal episodes.  MRI HEAD WITHOUT CONTRAST  Technique:  Multiplanar, multiecho pulse sequences of the brain and surrounding structures were obtained according to  standard protocol without intravenous contrast.  Comparison: Head CT 08/09/2009.  MRI 06/16/2009.  Findings: Diffusion imaging does not show any acute or subacute infarction.  There are old infarctions in the pons (left worse than right),  left basal ganglia and the left radiating white matter tracts.  Other small vessel infarctions are present within the cerebral hemispheric white matter.  No mass lesion, hemorrhage, hydrocephalus or extra-axial collection.  No pituitary mass.  No inflammatory sinus disease.  No skull or skull base lesion.  IMPRESSION: No acute finding.  Old infarctions affecting the pons, left basal ganglia and bilateral hemispheric white matter.   Original Report Authenticated By: Thomasenia Sales, M.D.    Dg Chest Port 1 View  10/16/2011  *RADIOLOGY REPORT*  Clinical Data: Chest pain  PORTABLE CHEST - 1 VIEW  Comparison: 11/11/2009  Findings: 2205 hours. The cardiopericardial silhouette is enlarged. The lungs are clear without focal infiltrate, edema, pneumothorax or pleural effusion. Imaged bony structures of the thorax are intact.  IMPRESSION: Enlargement the cardiopericardial silhouette without edema or focal lung consolidation.   Original Report Authenticated By: ERIC A. MANSELL, M.D.      Review of Systems  Constitutional: Negative for fever, chills and diaphoresis.  HENT: Negative for ear pain, sore throat and trouble swallowing.   Eyes: Negative for photophobia and visual disturbance.  Respiratory: Negative for cough and choking.   Cardiovascular: Negative for chest pain and palpitations.  Gastrointestinal: Negative for nausea, vomiting, abdominal pain, diarrhea, constipation, anal bleeding and rectal pain.  Genitourinary: Negative for dysuria, frequency and difficulty urinating.  Musculoskeletal: Negative for myalgias and gait problem.  Skin: Negative for color change, pallor and rash.  Neurological: Negative for dizziness, speech difficulty, weakness and numbness.  Hematological: Negative for adenopathy.  Psychiatric/Behavioral: Negative for confusion and agitation. The patient is not nervous/anxious.        Objective:   Physical Exam  Constitutional: She is oriented to person, place, and time. She  appears well-developed and well-nourished. No distress.  HENT:  Head: Normocephalic.  Mouth/Throat: Oropharynx is clear and moist. No oropharyngeal exudate.  Eyes: Conjunctivae normal and EOM are normal. Pupils are equal, round, and reactive to light. No scleral icterus.  Neck: Normal range of motion. No tracheal deviation present.  Cardiovascular: Normal rate and intact distal pulses.   Pulmonary/Chest: Effort normal. No respiratory distress. She exhibits no tenderness.  Abdominal: Soft. She exhibits no distension and no mass. There is no tenderness. There is no guarding. Hernia confirmed negative in the right inguinal area and confirmed negative in the left inguinal area.       Incisions clean with normal healing ridges.  No hernias  Genitourinary: No vaginal discharge found.  Musculoskeletal: Normal range of motion. She exhibits no tenderness.  Lymphadenopathy:       Right: No inguinal adenopathy present.       Left: No inguinal adenopathy present.  Neurological: She is alert and oriented to person, place, and time. No cranial nerve deficit. She exhibits normal muscle tone. Coordination normal.  Skin: Skin is warm and dry. No rash noted. She is not diaphoretic.  Psychiatric: She has a normal mood and affect. Her behavior is normal.       Assessment:     One month status post laparoscopic ventral hernia repair in a patient with history of stroke and insulin requiring diabetes.  Much improved    Plan:     Increase activity as tolerated.  Do not push through pain.  Gradually wean  off tramadol as tolerated.  Seems to be making definite progress.  Consider seeing neurology if having worsening symptoms.  We have tried to encourage her allergy to see her.  They are reviewing the chart and we will let her now.  Diet as tolerated. Bowel regimen to avoid problems.  Return to clinic p.r.n. The patient expressed understanding and appreciation

## 2011-11-03 NOTE — Patient Instructions (Addendum)
Managing Pain  Pain after surgery or related to activity is often due to strain/injury to muscle, tendon, nerves and/or incisions.  This pain is usually short-term and will improve in a few months.   Many people find it helpful to do the following things TOGETHER to help speed the process of healing and to get back to regular activity more quickly:  1. Avoid heavy physical activity a.  no lifting greater than 20 pounds b. Do not "push through" the pain.  Listen to your body and avoid positions and maneuvers than reproduce the pain c. Walking is okay as tolerated, but go slowly and stop when getting sore.  d. Remember: If it hurts to do it, then don't do it! 2. Take Anti-inflammatory medication  a. Take with food/snack around the clock for 1-2 weeks i. This helps the muscle and nerve tissues become less irritable and calm down faster b. Choose ONE of the following over-the-counter medications: i. Naproxen 220mg  tabs (ex. Aleve) 1-2 pills twice a day  ii. Ibuprofen 200mg  tabs (ex. Advil, Motrin) 3-4 pills with every meal and just before bedtime iii. Acetaminophen 500mg  tabs (Tylenol) 1-2 pills with every meal and just before bedtime 3. Use a Heating pad or Ice/Cold Pack a. 4-6 times a day b. May use warm bath/hottub  or showers 4. Try Gentle Massage and/or Stretching  a. at the area of pain many times a day b. stop if you feel pain - do not overdo it  Try these steps together to help you body heal faster and avoid making things get worse.  Doing just one of these things may not be enough.    If you are not getting better after two weeks or are noticing you are getting worse, contact our office for further advice; we may need to re-evaluate you & see what other things we can do to help.  Hernia Repair Care After These instructions give you information on caring for yourself after your procedure. Your doctor may also give you more specific instructions. Call your doctor if you have any  problems or questions after your procedure. HOME CARE   You may have changes in your poops (bowel movements).  You may have loose or watery poop (diarrhea).  You may be not able to poop.  Your bowels will slowly get back to normal.  Do not eat any food that makes you sick to your stomach (nauseous). Eat small meals 4 to 6 times a day instead of 3 large ones.  Do not drink pop. It will give you gas.  Do not drink alcohol.  Do not lift anything heavier than 10 pounds. This is about the weight of a gallon of milk.  Do not do anything that makes you very tired for at least 6 weeks.  Do not get your wound wet for 2 days.  You may take a sponge bath during this time.  After 2 days you may take a shower. Gently pat your surgical cut (incision) dry with a towel. Do not rub it.  For men: You may have been given an athletic supporter (scrotal support) before you left the hospital. It holds your scrotum and testicles closer to your body so there is no strain on your wound. Wear the supporter until your doctor tells you that you do not need it anymore. GET HELP RIGHT AWAY IF:  You have watery poop, or cannot poop for more than 3 days.  You feel sick to your stomach or throw  up (vomit) more than 2 or 3 times.  You have temperature by mouth above 102 F (38.9 C).  You see redness or puffiness (swelling) around your wound.  You see yellowish white fluid (pus) coming from your wound.  You see a bulge or bump in your lower belly (abdomen) or near your groin.  You develop a rash, trouble breathing, or any other symptoms from medicines taken. MAKE SURE YOU:  Understand these instructions.  Will watch your condition.  Will get help right away if your are not doing well or get worse. Document Released: 12/13/2007 Document Revised: 03/24/2011 Document Reviewed: 12/13/2007 [facility] Patient Information 2013 [place], [place].

## 2011-11-15 ENCOUNTER — Other Ambulatory Visit (HOSPITAL_COMMUNITY): Payer: Self-pay | Admitting: Surgery

## 2011-11-17 NOTE — Telephone Encounter (Signed)
Please advise if okay to refill. 

## 2011-11-17 NOTE — Telephone Encounter (Signed)
Medication E prescribed.

## 2011-12-01 ENCOUNTER — Ambulatory Visit (INDEPENDENT_AMBULATORY_CARE_PROVIDER_SITE_OTHER): Payer: Federal, State, Local not specified - PPO | Admitting: Surgery

## 2011-12-01 ENCOUNTER — Encounter (INDEPENDENT_AMBULATORY_CARE_PROVIDER_SITE_OTHER): Payer: Federal, State, Local not specified - PPO | Admitting: Surgery

## 2011-12-01 ENCOUNTER — Encounter (INDEPENDENT_AMBULATORY_CARE_PROVIDER_SITE_OTHER): Payer: Self-pay | Admitting: Surgery

## 2011-12-01 VITALS — BP 112/70 | HR 64 | Temp 98.6°F | Resp 18 | Ht 66.5 in | Wt 239.0 lb

## 2011-12-01 DIAGNOSIS — K432 Incisional hernia without obstruction or gangrene: Secondary | ICD-10-CM

## 2011-12-01 DIAGNOSIS — E669 Obesity, unspecified: Secondary | ICD-10-CM

## 2011-12-01 NOTE — Patient Instructions (Addendum)
Managing Pain  Pain after surgery or related to activity is often due to strain/injury to muscle, tendon, nerves and/or incisions.  This pain is usually short-term and will improve in a few months.   Many people find it helpful to do the following things TOGETHER to help speed the process of healing and to get back to regular activity more quickly:  1. Avoid heavy physical activity a.  no lifting greater than 20 pounds b. Do not "push through" the pain.  Listen to your body and avoid positions and maneuvers than reproduce the pain c. Walking is okay as tolerated, but go slowly and stop when getting sore.  d. Remember: If it hurts to do it, then don't do it! 2. Take Anti-inflammatory medication  a. Take with food/snack around the clock for 1-2 weeks i. This helps the muscle and nerve tissues become less irritable and calm down faster b. Choose ONE of the following over-the-counter medications: i. Naproxen 220mg  tabs (ex. Aleve) 1-2 pills twice a day  ii. Ibuprofen 200mg  tabs (ex. Advil, Motrin) 3-4 pills with every meal and just before bedtime iii. Acetaminophen 500mg  tabs (Tylenol) 1-2 pills with every meal and just before bedtime 3. Use a Heating pad or Ice/Cold Pack a. 4-6 times a day b. May use warm bath/hottub  or showers 4. Try Gentle Massage and/or Stretching  a. at the area of pain many times a day b. stop if you feel pain - do not overdo it  Try these steps together to help you body heal faster and avoid making things get worse.  Doing just one of these things may not be enough.    If you are not getting better after two weeks or are noticing you are getting worse, contact our office for further advice; we may need to re-evaluate you & see what other things we can do to help.  Exercise to Stay Healthy Exercise helps you become and stay healthy. EXERCISE IDEAS AND TIPS Choose exercises that:  You enjoy.  Fit into your day. You do not need to exercise really hard to be  healthy. You can do exercises at a slow or medium level and stay healthy. You can:  Stretch before and after working out.  Try yoga, Pilates, or tai chi.  Lift weights.  Walk fast, swim, jog, run, climb stairs, bicycle, dance, or rollerskate.  Take aerobic classes. Exercises that burn about 150 calories:  Running 1  miles in 15 minutes.  Playing volleyball for 45 to 60 minutes.  Washing and waxing a car for 45 to 60 minutes.  Playing touch football for 45 minutes.  Walking 1  miles in 35 minutes.  Pushing a stroller 1  miles in 30 minutes.  Playing basketball for 30 minutes.  Raking leaves for 30 minutes.  Bicycling 5 miles in 30 minutes.  Walking 2 miles in 30 minutes.  Dancing for 30 minutes.  Shoveling snow for 15 minutes.  Swimming laps for 20 minutes.  Walking up stairs for 15 minutes.  Bicycling 4 miles in 15 minutes.  Gardening for 30 to 45 minutes.  Jumping rope for 15 minutes.  Washing windows or floors for 45 to 60 minutes. Document Released: 02/01/2010 Document Revised: 03/24/2011 Document Reviewed: 02/01/2010 [facility] Patient Information 2013 [place], [place].

## 2011-12-01 NOTE — Progress Notes (Signed)
Subjective:     Patient ID: Tina [name], female   DOB: 27-Apr-1970, 41 y.o.   MRN: [id number]  HPI   Tina [name]  1970/07/26 [id number]  Patient Care Team: [name] Needle, MD as PCP - General Sheronette Cathie Beams, MD as Consulting Physician (Obstetrics and Gynecology) Micki Riley, MD as Consulting Physician (Neurology)  This patient is a 41 y.o.female who presents today for surgical evaluation s/p lap VWH repair w mesh 10/13/2011.   Patient is now almost 2 months after repair of ventral hernia.    Since she has been home she is feeling better.  Off narcotics.  Walking more. Occasional RLQ pulls when stretching/turning - mild & going away.  Using a bowel regimen to avoid constipation.  Energy level good.  Not able to get into see the neurologist yet.  She is hopeful that means that nothing serious is going on.  Patient Active Problem List  Diagnosis  . Right spastic hemiparesis  . Lumbago  . Paresthesias  . Sciatica  . Paresthesias in right hand  . Rotator cuff tear  . Hernia, incisional, RLQ, s/p lap repair Sep 2013  . Obesity (BMI 30-39.9)  . Abdominal pain, LUQ (left upper quadrant)  . Mental status change  . Leukocytosis  . DM2 (diabetes mellitus, type 2)  . Hx of migraines    Past Medical History  Diagnosis Date  . Flaccid hemiplegia affecting dominant side   . Enthesopathy of hip region     RIGHT HIP PAIN WITH PAIN DOWN RIGHT LEG  . Lumbago     LOWER BACK  . Spastic hemiplegia affecting dominant side   . Panic disorder without agoraphobia   . Depression   . Anxiety   . Diabetes mellitus   . Hypertension   . Blood transfusion     IN 2012  AFTER C -SECTION  . Cerebral thrombosis with cerebral infarction JUNE 2011    RIGHT SIDED WEAKNESS ( ARM AND LEG ) AND SPASMS  . Stroke   . Right rotator cuff tear     PAIN IN RIGHT SHOULDER  . Ventral hernia     RIGHT LOWER QUADRANT-CAUSING SOME PAIN  . Cough     STARTED 09/24/11--NO OTHER SYMPTOMS.  Marland Kitchen Anemia      DURING MENSES--HAS HEAVY BLEEDING WITH PERODS  . Headache     MIGRAINES--NOT REALLY HEADACHE-MORE LIKE PRESSURE SENSATION IN HEAD-FEELS DIZZIY AND  FAINT AS THE PRESSURE RESOLVES  . Diabetic neuropathy     BOTH FEET --COMES AND GOES  . Restless leg syndrome     DIAGNOSED BY SLEEP STUDY - PT TOLD SHE DID NOT HAVE SLEEP APNEA  . Rash     HANDS, ARMS --STATES HX OF RASH EVER SINCE CHILDBIRTH/PREGNANCY.  STATES THE RASH OFTEN OCCURS WHEN SHE IS REALLY STRESSED.    Past Surgical History  Procedure Date  . Cesarean section 2012  . Uterine fibroid surgery     2 SURGERIES FOR FIBROIDS  . Ureter revision   . Ventral hernia repair 10/03/2011    Procedure: LAPAROSCOPIC VENTRAL HERNIA;  Surgeon: Ardeth Sportsman, MD;  Location: [facility];  Service: General;  Laterality: N/A;  . Hernia repair 10/03/11    ventral hernia repair    History   Social History  . Marital Status: Married    Spouse Name: N/A    Number of Children: N/A  . Years of Education: N/A   Occupational History  . Not on file.   Social History  Main Topics  . Smoking status: Never Smoker   . Smokeless tobacco: Never Used  . Alcohol Use: No  . Drug Use: No  . Sexually Active: Yes    Birth Control/ Protection: None   Other Topics Concern  . Not on file   Social History Narrative  . No narrative on file    Family History  Problem Relation Age of Onset  . Diabetes Father   . Kidney disease Father   . Other Neg Hx   . Diabetes Maternal Grandmother   . Hyperlipidemia Paternal Grandmother   . Stroke Paternal Grandmother     Current Outpatient Prescriptions  Medication Sig Dispense Refill  . aspirin 325 MG tablet Take 325 mg by mouth daily.       . B-D INS SYR ULTRAFINE .3CC/30G 30G X 1/2" 0.3 ML MISC       . B-D ULTRAFINE III SHORT PEN 31G X 8 MM MISC       . EXFORGE HCT 10-320-25 MG TABS Take 1 tablet by mouth every evening.       . furosemide (LASIX) 40 MG tablet Take 20-40 mg by mouth daily.      Marland Kitchen  gabapentin (NEURONTIN) 300 MG capsule Take 300 mg by mouth 3 (three) times daily.      . insulin glargine (LANTUS) 100 UNIT/ML injection Inject 20-22 Units into the skin 2 (two) times daily. Inject 22 units in the morning and 20 units in the evening      . insulin lispro (HUMALOG) 100 UNIT/ML injection Inject 6-10 Units into the skin 3 (three) times daily before meals. Pt on sliding scale      . Multiple Vitamin (MULTIVITAMIN WITH MINERALS) TABS Take 1 tablet by mouth daily.      . promethazine (PHENERGAN) 12.5 MG tablet Take 12.5 mg by mouth every 6 (six) hours as needed. For nausea      . tiZANidine (ZANAFLEX) 4 MG capsule Take 4 mg by mouth at bedtime.      . traMADol (ULTRAM) 50 MG tablet TAKE 1 TABLET (50 MG TOTAL) BY MOUTH EVERY 6 (SIX) HOURS AS NEEDED FOR PAIN. FOR PAIN  30 tablet  0  . ziprasidone (GEODON) 80 MG capsule Take 80 mg by mouth daily.         Allergies  Allergen Reactions  . Contrast Media (Iodinated Diagnostic Agents) Other (See Comments)    Difficulty breathing  . Iohexol Hives, Nausea And Vomiting and Swelling     Desc: Magnevist-gadolinium-difficulty breathing, throat swelling   . Midazolam Hcl Anaphylaxis    Difficulty breathing  . Shellfish Allergy Anaphylaxis  . Avandia (Rosiglitazone Maleate)     Patient doesn't remember reaction  . Kiwi Extract Itching and Swelling  . Metformin And Related Nausea And Vomiting and Other (See Comments)    diarrhea  . Other Itching    Patient is allergic to all nuts except peanuts.     BP 112/70  Pulse 64  Temp 98.6 F (37 C) (Temporal)  Resp 18  Ht 5' 6.5" (1.689 m)  Wt 239 lb (108.41 kg)  BMI 38.00 kg/m2  Ct Abdomen Pelvis Wo Contrast  10/15/2011  *RADIOLOGY REPORT*  Clinical Data: Upper abdominal pain with nausea vomiting.  Status post left.  The ventral hernia repair on 10/03/2011.  CT ABDOMEN AND PELVIS WITHOUT CONTRAST  Technique:  Multidetector CT imaging of the abdomen and pelvis was performed following the  standard protocol without intravenous contrast.  Comparison: 07/30/2011  Findings:  Small area of posterior airspace disease in the right base suggests pneumonia.  The liver measures 21.2 cm in cranial caudal length.  Liver and spleen show no focal abnormality on the study performed without intravenous contrast material.  The stomach, duodenum, pancreas, gallbladder, and adrenal glands are unremarkable.  Left kidney is atrophic with multiple areas of scarring.  Right kidney has unremarkable appearance.  No abdominal aortic aneurysm.  There is a trace amount of fluid anterior to the liver.  Imaging through the pelvis shows the patient to be status post ventral hernia repair with mesh placement.  Assessment of the ventral abdominal wall is limited by the lack of intravenous contrast material.  However, a 6.6 x 3.7 cm fluid collection is seen superficial to the rectus fascia about 11 mm caudal to the umbilicus and just anterior to the right iliac crest.  This fluid appears to track down into the right aspect of the  right rectus sheath inferiorly.  There is some fluid anterior to the opacified, nondilated small bowel loops in the anterior right lower quadrant. While the axial images suggest that some of this fluid is immediately adjacent/around the small bowel loops, the sagittal re- formations suggest that most of the fluid is actually anterior to the small bowel loops and I suspect that it is extraperitoneal but that there may be some laxity in either the mesh or the peritoneum in this region which causes the appearance on the axial images.  If this was truly intraperitoneal fluid, I would expect some free flowing fluid elsewhere in the peritoneal cavity, but there is none.  Diffuse edema is seen within the subcutaneous fat of the lower abdomen and upper pelvis.  There is no evidence for a small bowel or colonic obstruction at this time.  Fibroid changes seen in the uterus.  No evidence for an adnexal mass.  Bone  windows reveal no worrisome lytic or sclerotic osseous lesions.  IMPRESSION: Fluid collection is seen along the lower right margin of the ventral mesh with a focal area of fluid projecting superficial to the rectus fascia. The the fluid collection measures essentially water density and may be a seroma or old, liquefied hematoma. Superinfection of this fluid cannot be excluded by CT, but there is no evidence for gas within it.  Axial images suggest that some of the fluid surrounds small bowel loops, but this is less evident on the sagittal re-formations and I suspect that the fluid is all extraperitoneal but the appearance on the axial images is secondary to some laxity redundancy in the inferior mass/perineum.  However, I cannot completely exclude that there is some loculated anterior intraperitoneal component to the fluid.  No definite evidence for bowel herniation.  Although there are multiple small bowel loops in close proximity to the fluid in the anterior abdominal wall, there is no evidence for bowel obstruction, bowel wall thickening, or other bowel complication at this time.  These findings were discussed with Dr. Biagio Quint at 2003 hours on 10/15/2011.   Original Report Authenticated By: ERIC A. MANSELL, M.D.    Mr Brain Wo Contrast  10/18/2011  *RADIOLOGY REPORT*  Clinical Data: Spastic hemiplegia.  History of previous infarction. Severe headache.  Syncopal episodes.  MRI HEAD WITHOUT CONTRAST  Technique:  Multiplanar, multiecho pulse sequences of the brain and surrounding structures were obtained according to standard protocol without intravenous contrast.  Comparison: Head CT 08/09/2009.  MRI 06/16/2009.  Findings: Diffusion imaging does not show any acute or subacute infarction.  There  are old infarctions in the pons (left worse than right), left basal ganglia and the left radiating white matter tracts.  Other small vessel infarctions are present within the cerebral hemispheric white matter.  No mass  lesion, hemorrhage, hydrocephalus or extra-axial collection.  No pituitary mass.  No inflammatory sinus disease.  No skull or skull base lesion.  IMPRESSION: No acute finding.  Old infarctions affecting the pons, left basal ganglia and bilateral hemispheric white matter.   Original Report Authenticated By: Thomasenia Sales, M.D.    Dg Chest Port 1 View  10/16/2011  *RADIOLOGY REPORT*  Clinical Data: Chest pain  PORTABLE CHEST - 1 VIEW  Comparison: 11/11/2009  Findings: 2205 hours. The cardiopericardial silhouette is enlarged. The lungs are clear without focal infiltrate, edema, pneumothorax or pleural effusion. Imaged bony structures of the thorax are intact.  IMPRESSION: Enlargement the cardiopericardial silhouette without edema or focal lung consolidation.   Original Report Authenticated By: ERIC A. MANSELL, M.D.      Review of Systems  Constitutional: Negative for fever, chills and diaphoresis.  HENT: Negative for ear pain, sore throat and trouble swallowing.   Eyes: Negative for photophobia and visual disturbance.  Respiratory: Negative for cough and choking.   Cardiovascular: Negative for chest pain and palpitations.  Gastrointestinal: Negative for nausea, vomiting, abdominal pain, diarrhea, constipation, anal bleeding and rectal pain.  Genitourinary: Negative for dysuria, frequency and difficulty urinating.  Musculoskeletal: Negative for myalgias and gait problem.  Skin: Negative for color change, pallor and rash.  Neurological: Negative for dizziness, speech difficulty, weakness and numbness.  Hematological: Negative for adenopathy.  Psychiatric/Behavioral: Negative for confusion and agitation. The patient is not nervous/anxious.        Objective:   Physical Exam  Constitutional: She is oriented to person, place, and time. She appears well-developed and well-nourished. No distress.  HENT:  Head: Normocephalic.  Mouth/Throat: Oropharynx is clear and moist. No oropharyngeal exudate.    Eyes: Conjunctivae normal and EOM are normal. Pupils are equal, round, and reactive to light. No scleral icterus.  Neck: Normal range of motion. No tracheal deviation present.  Cardiovascular: Normal rate and intact distal pulses.   Pulmonary/Chest: Effort normal. No respiratory distress. She exhibits no tenderness.  Abdominal: Soft. She exhibits no distension and no mass. There is no tenderness. There is no guarding. Hernia confirmed negative in the right inguinal area and confirmed negative in the left inguinal area.       Incisions clean with normal healing ridges.  No hernias  Genitourinary: No vaginal discharge found.  Musculoskeletal: Normal range of motion. She exhibits no tenderness.  Lymphadenopathy:       Right: No inguinal adenopathy present.       Left: No inguinal adenopathy present.  Neurological: She is alert and oriented to person, place, and time. No cranial nerve deficit. She exhibits normal muscle tone. Coordination normal.  Skin: Skin is warm and dry. No rash noted. She is not diaphoretic.  Psychiatric: She has a normal mood and affect. Her behavior is normal.       Assessment:     Two months status post laparoscopic ventral hernia repair in a patient with history of stroke and insulin requiring diabetes.  Much improved    Plan:     Increase activity as tolerated.  Do not push through pain.  Consider seeing neurology if having worsening symptoms.  We have tried to encourage her allergy to see her.  They are reviewing the chart and we will let  her now.  Diet as tolerated. Bowel regimen to avoid problems.  Return to clinic p.r.n. The patient expressed understanding and appreciation

## 2011-12-08 ENCOUNTER — Telehealth: Payer: Self-pay

## 2011-12-08 NOTE — Telephone Encounter (Signed)
Tina [name] with [facility] called to get home health continued for patients shoulder and vertigo.

## 2011-12-08 NOTE — Telephone Encounter (Signed)
Ok to continue home health PT for balance and shoulder problems

## 2011-12-08 NOTE — Telephone Encounter (Signed)
Tina [name] informed of orders.

## 2011-12-16 ENCOUNTER — Encounter: Payer: Federal, State, Local not specified - PPO | Attending: Physical Medicine & Rehabilitation

## 2011-12-16 ENCOUNTER — Encounter: Payer: Self-pay | Admitting: Physical Medicine & Rehabilitation

## 2011-12-16 ENCOUNTER — Ambulatory Visit (HOSPITAL_BASED_OUTPATIENT_CLINIC_OR_DEPARTMENT_OTHER): Payer: Federal, State, Local not specified - PPO | Admitting: Physical Medicine & Rehabilitation

## 2011-12-16 VITALS — BP 142/85 | HR 91 | Resp 14 | Ht 66.0 in | Wt 244.0 lb

## 2011-12-16 DIAGNOSIS — R5381 Other malaise: Secondary | ICD-10-CM

## 2011-12-16 DIAGNOSIS — I69351 Hemiplegia and hemiparesis following cerebral infarction affecting right dominant side: Secondary | ICD-10-CM

## 2011-12-16 DIAGNOSIS — M79609 Pain in unspecified limb: Secondary | ICD-10-CM | POA: Insufficient documentation

## 2011-12-16 DIAGNOSIS — I69959 Hemiplegia and hemiparesis following unspecified cerebrovascular disease affecting unspecified side: Secondary | ICD-10-CM | POA: Insufficient documentation

## 2011-12-16 DIAGNOSIS — R209 Unspecified disturbances of skin sensation: Secondary | ICD-10-CM | POA: Insufficient documentation

## 2011-12-16 DIAGNOSIS — M549 Dorsalgia, unspecified: Secondary | ICD-10-CM | POA: Insufficient documentation

## 2011-12-16 NOTE — Progress Notes (Signed)
Subjective:    Patient ID: Tina [name], female    DOB: March 31, 1970, 41 y.o.   MRN: [id number]  HPI Hernia repair surgery September 20 Readmitted in October. Decreased level of alertness and fall, MRI Showed no new stroke and EEG was normal Pain medication was reduced, pt improved HHPT and OT after hospital readmission Now amb without a cane  OT working on R shoulder pain which is chronic Prior ultrasound demonstrated intrasubstance tear right supraspinatus Pain Inventory Average Pain 9 Pain Right Now 7 My pain is constant  In the last 24 hours, has pain interfered with the following? General activity 8 Relation with others 7 Enjoyment of life 9 What TIME of day is your pain at its worst? all the time Sleep (in general) Poor  Pain is worse with: walking, sitting and standing Pain improves with: therapy/exercise and medication Relief from Meds: 5  Mobility Do you have any goals in this area?  no  Function not employed: date last employed 2011 Do you have any goals in this area?  no  Neuro/Psych weakness numbness tremor tingling trouble walking spasms dizziness confusion depression anxiety  Prior Studies Any changes since last visit?  no  Physicians involved in your care Any changes since last visit?  no   Family History  Problem Relation Age of Onset  . Diabetes Father   . Kidney disease Father   . Other Neg Hx   . Diabetes Maternal Grandmother   . Hyperlipidemia Paternal Grandmother   . Stroke Paternal Grandmother    History   Social History  . Marital Status: Married    Spouse Name: N/A    Number of Children: N/A  . Years of Education: N/A   Social History Main Topics  . Smoking status: Never Smoker   . Smokeless tobacco: Never Used  . Alcohol Use: No  . Drug Use: No  . Sexually Active: Yes    Birth Control/ Protection: None   Other Topics Concern  . None   Social History Narrative  . None   Past Surgical History  Procedure  Date  . Cesarean section 2012  . Uterine fibroid surgery     2 SURGERIES FOR FIBROIDS  . Ureter revision   . Ventral hernia repair 10/03/2011    Procedure: LAPAROSCOPIC VENTRAL HERNIA;  Surgeon: Ardeth Sportsman, MD;  Location: [facility];  Service: General;  Laterality: N/A;  . Hernia repair 10/03/11    ventral hernia repair   Past Medical History  Diagnosis Date  . Flaccid hemiplegia affecting dominant side   . Enthesopathy of hip region     RIGHT HIP PAIN WITH PAIN DOWN RIGHT LEG  . Lumbago     LOWER BACK  . Spastic hemiplegia affecting dominant side   . Panic disorder without agoraphobia   . Depression   . Anxiety   . Diabetes mellitus   . Hypertension   . Blood transfusion     IN 2012  AFTER C -SECTION  . Cerebral thrombosis with cerebral infarction JUNE 2011    RIGHT SIDED WEAKNESS ( ARM AND LEG ) AND SPASMS  . Stroke   . Right rotator cuff tear     PAIN IN RIGHT SHOULDER  . Ventral hernia     RIGHT LOWER QUADRANT-CAUSING SOME PAIN  . Cough     STARTED 09/24/11--NO OTHER SYMPTOMS.  Marland Kitchen Anemia     DURING MENSES--HAS HEAVY BLEEDING WITH PERODS  . Headache     MIGRAINES--NOT REALLY HEADACHE-MORE LIKE  PRESSURE SENSATION IN HEAD-FEELS DIZZIY AND  FAINT AS THE PRESSURE RESOLVES  . Diabetic neuropathy     BOTH FEET --COMES AND GOES  . Restless leg syndrome     DIAGNOSED BY SLEEP STUDY - PT TOLD SHE DID NOT HAVE SLEEP APNEA  . Rash     HANDS, ARMS --STATES HX OF RASH EVER SINCE CHILDBIRTH/PREGNANCY.  STATES THE RASH OFTEN OCCURS WHEN SHE IS REALLY STRESSED.  Marland Kitchen Hernia, incisional, RLQ, s/p lap repair Sep 2013 09/02/2011   BP 142/85  Pulse 91  Resp 14  Ht 5\' 6"  (1.676 m)  Wt 244 lb (110.678 kg)  BMI 39.38 kg/m2  SpO2 98%  LMP 12/10/2011     Review of Systems  Constitutional: Positive for unexpected weight change.  Cardiovascular: Positive for leg swelling.  Gastrointestinal: Positive for nausea and vomiting.  Musculoskeletal: Positive for myalgias, back pain,  arthralgias and gait problem.  Neurological: Positive for dizziness, tremors, weakness and numbness.  Psychiatric/Behavioral: Positive for confusion and dysphoric mood. The patient is nervous/anxious.   All other systems reviewed and are negative.       Objective:   Physical Exam  Nursing note and vitals reviewed. Constitutional: She is oriented to person, place, and time. She appears well-developed.       obese  HENT:  Head: Normocephalic and atraumatic.  Neurological: She is alert and oriented to person, place, and time.  Psychiatric: She has a normal mood and affect.   4/5 in the right deltoid, biceps, triceps, grip, hip flexor, knee extensors, ankle dorsiflexor plantar flexor 5/5 on the left side Sensation reduced to light touch on the right side in the upper and lower extremity Sensation normal on the left side No evidence of facial droop       Assessment & Plan:  1. Chronic right hemiparesis due to to left pontine and left basal ganglia infarcts, with deconditioning Continue home health See me in 2 months Discussed poststroke shoulder pain study in [place]

## 2011-12-16 NOTE — Patient Instructions (Signed)
Please let me know if you've like more information about the shoulder pain study in [place]

## 2012-01-01 ENCOUNTER — Other Ambulatory Visit (HOSPITAL_COMMUNITY): Payer: Self-pay | Admitting: Surgery

## 2012-01-02 NOTE — Telephone Encounter (Signed)
Please advise if okay to refill. 

## 2012-01-02 NOTE — Telephone Encounter (Signed)
One more refill OK.  No more after that without call/discussion

## 2012-02-16 ENCOUNTER — Ambulatory Visit: Payer: Federal, State, Local not specified - PPO | Admitting: Physical Medicine & Rehabilitation

## 2012-02-16 ENCOUNTER — Encounter
Payer: Federal, State, Local not specified - PPO | Attending: Physical Medicine & Rehabilitation | Admitting: Physical Medicine and Rehabilitation

## 2012-02-16 ENCOUNTER — Encounter: Payer: Self-pay | Admitting: Physical Medicine and Rehabilitation

## 2012-02-16 ENCOUNTER — Other Ambulatory Visit: Payer: Self-pay | Admitting: Physical Medicine & Rehabilitation

## 2012-02-16 ENCOUNTER — Encounter: Payer: Self-pay | Admitting: Physical Medicine & Rehabilitation

## 2012-02-16 VITALS — BP 116/69 | HR 74 | Resp 14 | Ht 66.0 in | Wt 243.0 lb

## 2012-02-16 DIAGNOSIS — M719 Bursopathy, unspecified: Secondary | ICD-10-CM | POA: Insufficient documentation

## 2012-02-16 DIAGNOSIS — M545 Low back pain, unspecified: Secondary | ICD-10-CM

## 2012-02-16 DIAGNOSIS — M75101 Unspecified rotator cuff tear or rupture of right shoulder, not specified as traumatic: Secondary | ICD-10-CM

## 2012-02-16 DIAGNOSIS — I639 Cerebral infarction, unspecified: Secondary | ICD-10-CM

## 2012-02-16 DIAGNOSIS — M67919 Unspecified disorder of synovium and tendon, unspecified shoulder: Secondary | ICD-10-CM | POA: Insufficient documentation

## 2012-02-16 DIAGNOSIS — I6350 Cerebral infarction due to unspecified occlusion or stenosis of unspecified cerebral artery: Secondary | ICD-10-CM | POA: Insufficient documentation

## 2012-02-16 MED ORDER — DICLOFENAC SODIUM 1 % TD GEL: 2.0000 g | Freq: Four times a day (QID) | TRANSDERMAL | Status: DC

## 2012-02-16 MED ORDER — TRAMADOL HCL 50 MG PO TABS: 50.0000 mg | ORAL_TABLET | Freq: Two times a day (BID) | ORAL | Status: DC

## 2012-02-16 NOTE — Progress Notes (Signed)
Subjective:    Patient ID: Tina [name], female    DOB: 12/08/1970, 42 y.o.   MRN: [id number]  HPI The patient complains about chronic right shoulder pain which radiates into  Her right UE. The patient also complains about numbness and tingling in her right entire forarm. Shoulder movement and lying on the shoulder aggrevates the pain, moist heat or a heating pad helps with the pain. Injection did not help, 2 month of PT and OT, with exercising daily at home has not improved her shoulder pain much. The problem has gotten worse. Pain Inventory Average Pain 9 Pain Right Now 9 My pain is sharp, burning, stabbing and aching  In the last 24 hours, has pain interfered with the following? General activity 10 Relation with others 8 Enjoyment of life 9 What TIME of day is your pain at its worst? morning, evening and night Sleep (in general) Poor  Pain is worse with: walking, bending, sitting and standing Pain improves with: heat/ice, therapy/exercise and medication Relief from Meds: 4  Mobility use a cane ability to climb steps?  yes do you drive?  no Do you have any goals in this area?  no  Function not employed: date last employed  disabled: date disabled  I need assistance with the following:  dressing, bathing, meal prep, household duties and shopping Do you have any goals in this area?  no  Neuro/Psych weakness numbness tingling dizziness confusion depression anxiety  Prior Studies Any changes since last visit?  no  Physicians involved in your care Any changes since last visit?  no   Family History  Problem Relation Age of Onset  . Diabetes Father   . Kidney disease Father   . Other Neg Hx   . Diabetes Maternal Grandmother   . Hyperlipidemia Paternal Grandmother   . Stroke Paternal Grandmother    History   Social History  . Marital Status: Married    Spouse Name: N/A    Number of Children: N/A  . Years of Education: N/A   Social History Main Topics   . Smoking status: Never Smoker   . Smokeless tobacco: Never Used  . Alcohol Use: No  . Drug Use: No  . Sexually Active: Yes    Birth Control/ Protection: None   Other Topics Concern  . None   Social History Narrative  . None   Past Surgical History  Procedure Date  . Cesarean section 2012  . Uterine fibroid surgery     2 SURGERIES FOR FIBROIDS  . Ureter revision   . Ventral hernia repair 10/03/2011    Procedure: LAPAROSCOPIC VENTRAL HERNIA;  Surgeon: Ardeth Sportsman, MD;  Location: [facility];  Service: General;  Laterality: N/A;  . Hernia repair 10/03/11    ventral hernia repair   Past Medical History  Diagnosis Date  . Flaccid hemiplegia affecting dominant side   . Enthesopathy of hip region     RIGHT HIP PAIN WITH PAIN DOWN RIGHT LEG  . Lumbago     LOWER BACK  . Spastic hemiplegia affecting dominant side   . Panic disorder without agoraphobia   . Depression   . Anxiety   . Diabetes mellitus   . Hypertension   . Blood transfusion     IN 2012  AFTER C -SECTION  . Cerebral thrombosis with cerebral infarction JUNE 2011    RIGHT SIDED WEAKNESS ( ARM AND LEG ) AND SPASMS  . Stroke   . Right rotator cuff tear  PAIN IN RIGHT SHOULDER  . Ventral hernia     RIGHT LOWER QUADRANT-CAUSING SOME PAIN  . Cough     STARTED 09/24/11--NO OTHER SYMPTOMS.  Marland Kitchen Anemia     DURING MENSES--HAS HEAVY BLEEDING WITH PERODS  . Headache     MIGRAINES--NOT REALLY HEADACHE-MORE LIKE PRESSURE SENSATION IN HEAD-FEELS DIZZIY AND  FAINT AS THE PRESSURE RESOLVES  . Diabetic neuropathy     BOTH FEET --COMES AND GOES  . Restless leg syndrome     DIAGNOSED BY SLEEP STUDY - PT TOLD SHE DID NOT HAVE SLEEP APNEA  . Rash     HANDS, ARMS --STATES HX OF RASH EVER SINCE CHILDBIRTH/PREGNANCY.  STATES THE RASH OFTEN OCCURS WHEN SHE IS REALLY STRESSED.  Marland Kitchen Hernia, incisional, RLQ, s/p lap repair Sep 2013 09/02/2011   BP 116/69  Pulse 74  Resp 14  Ht 5\' 6"  (1.676 m)  Wt 243 lb (110.224 kg)  BMI 39.22  kg/m2  SpO2 100%  LMP 02/08/2012     Review of Systems  Constitutional: Positive for unexpected weight change.  Cardiovascular: Positive for leg swelling.  Gastrointestinal: Positive for nausea.  Musculoskeletal: Positive for back pain.  Neurological: Positive for dizziness, weakness and numbness.  Psychiatric/Behavioral: Positive for confusion and dysphoric mood. The patient is nervous/anxious.   All other systems reviewed and are negative.       Objective:   Physical Exam  Constitutional: She is oriented to person, place, and time. She appears well-developed and well-nourished.       obese  HENT:  Head: Normocephalic.  Neck: Neck supple.  Musculoskeletal: She exhibits tenderness.  Neurological: She is alert and oriented to person, place, and time.  Skin: Skin is warm and dry.  Psychiatric: She has a normal mood and affect.    Symmetric normal motor tone is noted throughout. Normal muscle bulk. Muscle testing reveals 5/5 muscle strength of the upper extremity on the left, about 4/5 on the right, except supraspinatus 3+/5 and 5/5 of the lower extremity. Full range of motion in upper and lower extremities, except right shoulder abduction 50 degrees, external rotation 30 degrees, flexion restricted because of pain.Empty can test positive on the right. ROM of spine is restricted.   DTR in the upper and lower extremity are present and symmetric 2+ on the left, on the right 3+. No clonus is noted.  Patient arises from chair with mild difficulty. Wide based gait with deminished arm swing , slight limp      Assessment & Plan:  1. Chronic right hemiparesis due to to left pontine and left basal ganglia infarcts, June 2012 with deconditioning . 2. Rotator cuff syndrome on the right, mainly supraspinatus effected.Injection , extensive PT , OT did not improve her shoulder much. Continue with the exercises you learned from PT and OT at home, referred patient to ortho, shoulder  specialist, for further evaluation.  Prescribed Voltaren gel, refilled Tramadol 50mg  bid # 60 See me in 6 weeks

## 2012-02-16 NOTE — Patient Instructions (Signed)
Continue with the exercises you learned from PT and OT, also use the Voltaren gel 4 times a day for pain.

## 2012-02-16 NOTE — Progress Notes (Unsigned)
Subjective:    Patient ID: Tina [name], female    DOB: 01-12-1971, 42 y.o.   MRN: [id number]  HPI  Pain Inventory Average Pain 9 Pain Right Now 9 My pain is sharp, burning, stabbing and aching  In the last 24 hours, has pain interfered with the following? General activity 10 Relation with others 8 Enjoyment of life 9 What TIME of day is your pain at its worst? morning, evening and night Sleep (in general) Poor  Pain is worse with: walking, bending, sitting and standing Pain improves with: heat/ice, therapy/exercise and medication Relief from Meds: 4  Mobility use a cane ability to climb steps?  yes do you drive?  no Do you have any goals in this area?  no  Function disabled: date disabled  I need assistance with the following:  dressing, bathing, meal prep, household duties and shopping Do you have any goals in this area?  no  Neuro/Psych weakness numbness tingling dizziness confusion depression anxiety  Prior Studies Any changes since last visit?  no  Physicians involved in your care Any changes since last visit?  no   Family History  Problem Relation Age of Onset  . Diabetes Father   . Kidney disease Father   . Other Neg Hx   . Diabetes Maternal Grandmother   . Hyperlipidemia Paternal Grandmother   . Stroke Paternal Grandmother    History   Social History  . Marital Status: Married    Spouse Name: N/A    Number of Children: N/A  . Years of Education: N/A   Social History Main Topics  . Smoking status: Never Smoker   . Smokeless tobacco: Never Used  . Alcohol Use: No  . Drug Use: No  . Sexually Active: Yes    Birth Control/ Protection: None   Other Topics Concern  . None   Social History Narrative  . None   Past Surgical History  Procedure Date  . Cesarean section 2012  . Uterine fibroid surgery     2 SURGERIES FOR FIBROIDS  . Ureter revision   . Ventral hernia repair 10/03/2011    Procedure: LAPAROSCOPIC VENTRAL HERNIA;   Surgeon: Ardeth Sportsman, MD;  Location: [facility];  Service: General;  Laterality: N/A;  . Hernia repair 10/03/11    ventral hernia repair   Past Medical History  Diagnosis Date  . Flaccid hemiplegia affecting dominant side   . Enthesopathy of hip region     RIGHT HIP PAIN WITH PAIN DOWN RIGHT LEG  . Lumbago     LOWER BACK  . Spastic hemiplegia affecting dominant side   . Panic disorder without agoraphobia   . Depression   . Anxiety   . Diabetes mellitus   . Hypertension   . Blood transfusion     IN 2012  AFTER C -SECTION  . Cerebral thrombosis with cerebral infarction JUNE 2011    RIGHT SIDED WEAKNESS ( ARM AND LEG ) AND SPASMS  . Stroke   . Right rotator cuff tear     PAIN IN RIGHT SHOULDER  . Ventral hernia     RIGHT LOWER QUADRANT-CAUSING SOME PAIN  . Cough     STARTED 09/24/11--NO OTHER SYMPTOMS.  Marland Kitchen Anemia     DURING MENSES--HAS HEAVY BLEEDING WITH PERODS  . Headache     MIGRAINES--NOT REALLY HEADACHE-MORE LIKE PRESSURE SENSATION IN HEAD-FEELS DIZZIY AND  FAINT AS THE PRESSURE RESOLVES  . Diabetic neuropathy     BOTH FEET --COMES AND GOES  .  Restless leg syndrome     DIAGNOSED BY SLEEP STUDY - PT TOLD SHE DID NOT HAVE SLEEP APNEA  . Rash     HANDS, ARMS --STATES HX OF RASH EVER SINCE CHILDBIRTH/PREGNANCY.  STATES THE RASH OFTEN OCCURS WHEN SHE IS REALLY STRESSED.  Marland Kitchen Hernia, incisional, RLQ, s/p lap repair Sep 2013 09/02/2011   BP 116/69  Pulse 74  Resp 14  Ht 5\' 6"  (1.676 m)  Wt 243 lb (110.224 kg)  BMI 39.22 kg/m2  SpO2 100%  LMP 02/08/2012     Review of Systems  Constitutional: Positive for unexpected weight change.  Cardiovascular: Positive for leg swelling.  Gastrointestinal: Positive for nausea.  Musculoskeletal: Positive for myalgias, back pain and arthralgias.  Neurological: Positive for dizziness and weakness.  Psychiatric/Behavioral: Positive for confusion and dysphoric mood. The patient is nervous/anxious.   All other systems reviewed and are  negative.       Objective:   Physical Exam        Assessment & Plan:

## 2012-03-16 ENCOUNTER — Other Ambulatory Visit: Payer: Self-pay | Admitting: Physical Medicine & Rehabilitation

## 2012-03-29 ENCOUNTER — Encounter: Payer: Federal, State, Local not specified - PPO | Admitting: Physical Medicine and Rehabilitation

## 2012-04-02 ENCOUNTER — Encounter: Payer: Self-pay | Admitting: Physical Medicine and Rehabilitation

## 2012-04-02 ENCOUNTER — Encounter
Payer: Federal, State, Local not specified - PPO | Attending: Physical Medicine and Rehabilitation | Admitting: Physical Medicine and Rehabilitation

## 2012-04-02 VITALS — BP 114/51 | HR 65 | Resp 14 | Ht 66.0 in | Wt 236.0 lb

## 2012-04-02 DIAGNOSIS — R209 Unspecified disturbances of skin sensation: Secondary | ICD-10-CM | POA: Insufficient documentation

## 2012-04-02 DIAGNOSIS — I69959 Hemiplegia and hemiparesis following unspecified cerebrovascular disease affecting unspecified side: Secondary | ICD-10-CM | POA: Insufficient documentation

## 2012-04-02 DIAGNOSIS — M545 Low back pain, unspecified: Secondary | ICD-10-CM

## 2012-04-02 DIAGNOSIS — E1149 Type 2 diabetes mellitus with other diabetic neurological complication: Secondary | ICD-10-CM | POA: Insufficient documentation

## 2012-04-02 DIAGNOSIS — M67919 Unspecified disorder of synovium and tendon, unspecified shoulder: Secondary | ICD-10-CM | POA: Insufficient documentation

## 2012-04-02 DIAGNOSIS — I10 Essential (primary) hypertension: Secondary | ICD-10-CM | POA: Insufficient documentation

## 2012-04-02 DIAGNOSIS — M719 Bursopathy, unspecified: Secondary | ICD-10-CM | POA: Insufficient documentation

## 2012-04-02 DIAGNOSIS — E1142 Type 2 diabetes mellitus with diabetic polyneuropathy: Secondary | ICD-10-CM | POA: Insufficient documentation

## 2012-04-02 DIAGNOSIS — G8929 Other chronic pain: Secondary | ICD-10-CM | POA: Insufficient documentation

## 2012-04-02 DIAGNOSIS — Z8673 Personal history of transient ischemic attack (TIA), and cerebral infarction without residual deficits: Secondary | ICD-10-CM

## 2012-04-02 DIAGNOSIS — M25519 Pain in unspecified shoulder: Secondary | ICD-10-CM | POA: Insufficient documentation

## 2012-04-02 MED ORDER — DICLOFENAC EPOLAMINE 1.3 % TD PTCH: 1.0000 | MEDICATED_PATCH | Freq: Two times a day (BID) | TRANSDERMAL | Status: DC

## 2012-04-02 NOTE — Patient Instructions (Signed)
Continue with your shoulder exercises and the ones I showed you, with a thera band.

## 2012-04-02 NOTE — Progress Notes (Signed)
Subjective:    Patient ID: Tina [name], female    DOB: 07/17/1970, 42 y.o.   MRN: [id number]  HPI The patient complains about chronic right shoulder pain which radiates into Her right UE. The patient also complains about numbness and tingling in her right entire forarm. Shoulder movement and lying on the shoulder aggrevates the pain, moist heat or a heating pad helps with the pain. Injection did not help, 2 month of PT and OT, with exercising daily at home has not improved her shoulder pain much in the past.  The problem has gotten better with the Voltaren gel, and continuing her exercise program. She did not go to a shoulder specialist, because it got better with those treatments.  Pain Inventory Average Pain 7 Pain Right Now 7 My pain is sharp, stabbing, tingling and aching  In the last 24 hours, has pain interfered with the following? General activity 8 Relation with others 7 Enjoyment of life 7 What TIME of day is your pain at its worst? morning, evening and night Sleep (in general) Poor  Pain is worse with: walking, bending, sitting and standing Pain improves with: medication Relief from Meds: 7  Mobility use a cane how many minutes can you walk? 12 ability to climb steps?  yes do you drive?  no Do you have any goals in this area?  no  Function not employed: date last employed unknown I need assistance with the following:  bathing, meal prep, household duties and shopping Do you have any goals in this area?  no  Neuro/Psych weakness numbness tingling spasms dizziness depression anxiety  Prior Studies Any changes since last visit?  no  Physicians involved in your care Any changes since last visit?  no   Family History  Problem Relation Age of Onset  . Diabetes Father   . Kidney disease Father   . Other Neg Hx   . Diabetes Maternal Grandmother   . Hyperlipidemia Paternal Grandmother   . Stroke Paternal Grandmother    History   Social History  .  Marital Status: Married    Spouse Name: N/A    Number of Children: N/A  . Years of Education: N/A   Social History Main Topics  . Smoking status: Never Smoker   . Smokeless tobacco: Never Used  . Alcohol Use: No  . Drug Use: No  . Sexually Active: Yes    Birth Control/ Protection: None   Other Topics Concern  . None   Social History Narrative  . None   Past Surgical History  Procedure Laterality Date  . Cesarean section  2012  . Uterine fibroid surgery      2 SURGERIES FOR FIBROIDS  . Ureter revision    . Ventral hernia repair  10/03/2011    Procedure: LAPAROSCOPIC VENTRAL HERNIA;  Surgeon: Ardeth Sportsman, MD;  Location: [facility];  Service: General;  Laterality: N/A;  . Hernia repair  10/03/11    ventral hernia repair   Past Medical History  Diagnosis Date  . Flaccid hemiplegia affecting dominant side   . Enthesopathy of hip region     RIGHT HIP PAIN WITH PAIN DOWN RIGHT LEG  . Lumbago     LOWER BACK  . Spastic hemiplegia affecting dominant side   . Panic disorder without agoraphobia   . Depression   . Anxiety   . Diabetes mellitus   . Hypertension   . Blood transfusion     IN 2012  AFTER C -SECTION  .  Cerebral thrombosis with cerebral infarction JUNE 2011    RIGHT SIDED WEAKNESS ( ARM AND LEG ) AND SPASMS  . Stroke   . Right rotator cuff tear     PAIN IN RIGHT SHOULDER  . Ventral hernia     RIGHT LOWER QUADRANT-CAUSING SOME PAIN  . Cough     STARTED 09/24/11--NO OTHER SYMPTOMS.  Marland Kitchen Anemia     DURING MENSES--HAS HEAVY BLEEDING WITH PERODS  . Headache     MIGRAINES--NOT REALLY HEADACHE-MORE LIKE PRESSURE SENSATION IN HEAD-FEELS DIZZIY AND  FAINT AS THE PRESSURE RESOLVES  . Diabetic neuropathy     BOTH FEET --COMES AND GOES  . Restless leg syndrome     DIAGNOSED BY SLEEP STUDY - PT TOLD SHE DID NOT HAVE SLEEP APNEA  . Rash     HANDS, ARMS --STATES HX OF RASH EVER SINCE CHILDBIRTH/PREGNANCY.  STATES THE RASH OFTEN OCCURS WHEN SHE IS REALLY STRESSED.  Marland Kitchen Hernia,  incisional, RLQ, s/p lap repair Sep 2013 09/02/2011   BP 114/51  Pulse 65  Resp 14  Ht 5\' 6"  (1.676 m)  Wt 236 lb (107.049 kg)  BMI 38.11 kg/m2  SpO2 99%  LMP 03/05/2012     Review of Systems  Musculoskeletal: Positive for back pain.  Neurological: Positive for dizziness, weakness and numbness.  Psychiatric/Behavioral: Positive for dysphoric mood. The patient is nervous/anxious.   All other systems reviewed and are negative.       Objective:   Physical Exam Constitutional: She is oriented to person, place, and time. She appears well-developed and well-nourished.  obese  HENT:  Head: Normocephalic.  Neck: Neck supple.  Musculoskeletal: She exhibits tenderness.  Neurological: She is alert and oriented to person, place, and time.  Skin: Skin is warm and dry.  Psychiatric: She has a normal mood and affect.  Symmetric normal motor tone is noted throughout. Normal muscle bulk. Muscle testing reveals 5/5 muscle strength of the upper extremity on the left, about 4/5 on the right, except supraspinatus 3+/5 and 5/5 of the lower extremity. Full range of motion in upper and lower extremities, except right shoulder abduction 80 degrees, external rotation 20 degrees, flexion 110 degrees .Empty can test positive on the right. ROM of spine is restricted.  DTR in the upper and lower extremity are present and symmetric 2+ on the left, on the right 3+. No clonus is noted.  Patient arises from chair with mild difficulty. Wide based gait with deminished arm swing , slight limp        Assessment & Plan:  1. Chronic right hemiparesis due to to left pontine and left basal ganglia infarcts, June 2012 with deconditioning .  2. Rotator cuff syndrome on the right, mainly supraspinatus effected.Injection , extensive PT , OT did not improve her shoulder much.Voltaren gel has helped, also showed patient specific exercises with a thera band for her right shoulder. 3. LBP, patient complains about  exacerbation of her LBP, for those I prescribed Flector patches. I educated the patient, that she should not use her Voltaren gel when she uses the Flector patches and vice versa, the patient understood.   See me in 5 month.

## 2012-06-02 ENCOUNTER — Emergency Department (HOSPITAL_COMMUNITY): Payer: Federal, State, Local not specified - PPO

## 2012-06-02 ENCOUNTER — Encounter (HOSPITAL_COMMUNITY): Payer: Self-pay | Admitting: Emergency Medicine

## 2012-06-02 ENCOUNTER — Inpatient Hospital Stay (HOSPITAL_COMMUNITY)
Admission: EM | Admit: 2012-06-02 | Discharge: 2012-06-05 | DRG: 174 | Disposition: A | Discharge status: Home / Self Care | Payer: Federal, State, Local not specified - PPO | Attending: Internal Medicine | Admitting: Internal Medicine

## 2012-06-02 DIAGNOSIS — Z7982 Long term (current) use of aspirin: Secondary | ICD-10-CM | POA: Diagnosis not present

## 2012-06-02 DIAGNOSIS — K922 Gastrointestinal hemorrhage, unspecified: Secondary | ICD-10-CM | POA: Diagnosis present

## 2012-06-02 DIAGNOSIS — M76899 Other specified enthesopathies of unspecified lower limb, excluding foot: Secondary | ICD-10-CM | POA: Diagnosis present

## 2012-06-02 DIAGNOSIS — I10 Essential (primary) hypertension: Secondary | ICD-10-CM | POA: Diagnosis present

## 2012-06-02 DIAGNOSIS — R202 Paresthesia of skin: Secondary | ICD-10-CM

## 2012-06-02 DIAGNOSIS — K920 Hematemesis: Secondary | ICD-10-CM | POA: Diagnosis not present

## 2012-06-02 DIAGNOSIS — R112 Nausea with vomiting, unspecified: Secondary | ICD-10-CM | POA: Diagnosis not present

## 2012-06-02 DIAGNOSIS — E1142 Type 2 diabetes mellitus with diabetic polyneuropathy: Secondary | ICD-10-CM | POA: Diagnosis present

## 2012-06-02 DIAGNOSIS — K56609 Unspecified intestinal obstruction, unspecified as to partial versus complete obstruction: Secondary | ICD-10-CM

## 2012-06-02 DIAGNOSIS — F410 Panic disorder [episodic paroxysmal anxiety] without agoraphobia: Secondary | ICD-10-CM | POA: Diagnosis present

## 2012-06-02 DIAGNOSIS — A088 Other specified intestinal infections: Secondary | ICD-10-CM | POA: Diagnosis present

## 2012-06-02 DIAGNOSIS — F411 Generalized anxiety disorder: Secondary | ICD-10-CM | POA: Diagnosis present

## 2012-06-02 DIAGNOSIS — E860 Dehydration: Secondary | ICD-10-CM | POA: Diagnosis present

## 2012-06-02 DIAGNOSIS — M545 Low back pain, unspecified: Secondary | ICD-10-CM | POA: Diagnosis present

## 2012-06-02 DIAGNOSIS — E1149 Type 2 diabetes mellitus with other diabetic neurological complication: Secondary | ICD-10-CM | POA: Diagnosis present

## 2012-06-02 DIAGNOSIS — F329 Major depressive disorder, single episode, unspecified: Secondary | ICD-10-CM | POA: Diagnosis present

## 2012-06-02 DIAGNOSIS — G8100 Flaccid hemiplegia affecting unspecified side: Secondary | ICD-10-CM | POA: Diagnosis present

## 2012-06-02 DIAGNOSIS — R1031 Right lower quadrant pain: Secondary | ICD-10-CM | POA: Diagnosis present

## 2012-06-02 DIAGNOSIS — Z8673 Personal history of transient ischemic attack (TIA), and cerebral infarction without residual deficits: Secondary | ICD-10-CM | POA: Diagnosis not present

## 2012-06-02 DIAGNOSIS — Z6838 Body mass index (BMI) 38.0-38.9, adult: Secondary | ICD-10-CM

## 2012-06-02 DIAGNOSIS — G8929 Other chronic pain: Secondary | ICD-10-CM | POA: Diagnosis present

## 2012-06-02 DIAGNOSIS — F3289 Other specified depressive episodes: Secondary | ICD-10-CM | POA: Diagnosis present

## 2012-06-02 DIAGNOSIS — K56600 Partial intestinal obstruction, unspecified as to cause: Secondary | ICD-10-CM

## 2012-06-02 DIAGNOSIS — D649 Anemia, unspecified: Secondary | ICD-10-CM | POA: Diagnosis present

## 2012-06-02 DIAGNOSIS — D72829 Elevated white blood cell count, unspecified: Secondary | ICD-10-CM

## 2012-06-02 DIAGNOSIS — IMO0002 Reserved for concepts with insufficient information to code with codable children: Secondary | ICD-10-CM

## 2012-06-02 DIAGNOSIS — K439 Ventral hernia without obstruction or gangrene: Secondary | ICD-10-CM

## 2012-06-02 DIAGNOSIS — G2581 Restless legs syndrome: Secondary | ICD-10-CM | POA: Diagnosis present

## 2012-06-02 DIAGNOSIS — E669 Obesity, unspecified: Secondary | ICD-10-CM | POA: Diagnosis present

## 2012-06-02 DIAGNOSIS — E119 Type 2 diabetes mellitus without complications: Secondary | ICD-10-CM

## 2012-06-02 DIAGNOSIS — Z8669 Personal history of other diseases of the nervous system and sense organs: Secondary | ICD-10-CM

## 2012-06-02 DIAGNOSIS — E1165 Type 2 diabetes mellitus with hyperglycemia: Secondary | ICD-10-CM

## 2012-06-02 LAB — URINALYSIS, ROUTINE W REFLEX MICROSCOPIC
Bilirubin Urine: NEGATIVE
Glucose, UA: >1000 mg/dL — AB
Protein, ur: NEGATIVE mg/dL
Specific Gravity, Urine: 1.026 (ref 1.005–1.030)
Urobilinogen, UA: 0.2 mg/dL (ref 0.0–1.0)

## 2012-06-02 LAB — CBC WITH DIFFERENTIAL/PLATELET
Basophils Relative: 0 % (ref 0–1)
Eosinophils Relative: 4 % (ref 0–5)
Hemoglobin: 14.2 g/dL (ref 12.0–15.0)
Lymphocytes Relative: 4 % — ABNORMAL LOW (ref 12–46)
Monocytes Relative: 5 % (ref 3–12)
Neutrophils Relative %: 87 % — ABNORMAL HIGH (ref 43–77)
Platelets: UNDETERMINED 10*3/uL (ref 150–400)
RBC: 5.44 MIL/uL — ABNORMAL HIGH (ref 3.87–5.11)
WBC: 9.9 10*3/uL (ref 4.0–10.5)

## 2012-06-02 LAB — COMPREHENSIVE METABOLIC PANEL
ALT: 14 U/L (ref 0–35)
AST: 31 U/L (ref 0–37)
Alkaline Phosphatase: 50 U/L (ref 39–117)
CO2: 17 mEq/L — ABNORMAL LOW (ref 19–32)
Calcium: 9.3 mg/dL (ref 8.4–10.5)
GFR calc Af Amer: >90 mL/min (ref >90)
GFR calc non Af Amer: 80 mL/min — ABNORMAL LOW (ref >90)
Glucose, Bld: 166 mg/dL — ABNORMAL HIGH (ref 70–99)
Potassium: 5.2 mEq/L — ABNORMAL HIGH (ref 3.5–5.1)
Sodium: 140 mEq/L (ref 135–145)

## 2012-06-02 LAB — BASIC METABOLIC PANEL
BUN: 26 mg/dL — ABNORMAL HIGH (ref 6–23)
Calcium: 8.8 mg/dL (ref 8.4–10.5)
Creatinine, Ser: 1.07 mg/dL (ref 0.50–1.10)
GFR calc Af Amer: 73 mL/min — ABNORMAL LOW (ref >90)
GFR calc non Af Amer: 63 mL/min — ABNORMAL LOW (ref >90)
Glucose, Bld: 204 mg/dL — ABNORMAL HIGH (ref 70–99)

## 2012-06-02 LAB — URINE MICROSCOPIC-ADD ON

## 2012-06-02 LAB — LACTIC ACID, PLASMA: Lactic Acid, Venous: 2.5 mmol/L — ABNORMAL HIGH (ref 0.5–2.2)

## 2012-06-02 MED ORDER — SODIUM CHLORIDE 0.9 % IV BOLUS (SEPSIS)
1000.0000 mL | Freq: Once | INTRAVENOUS | Status: AC
  Administered 2012-06-03: 1000 mL via INTRAVENOUS

## 2012-06-02 MED ORDER — ACETAMINOPHEN 325 MG PO TABS
650.0000 mg | ORAL_TABLET | Freq: Four times a day (QID) | ORAL | Status: DC | PRN
  Administered 2012-06-02: 650 mg via ORAL
  Filled 2012-06-02: qty 2

## 2012-06-02 MED ORDER — DEXTROSE 5 % IV SOLN
1.0000 g | Freq: Once | INTRAVENOUS | Status: AC
  Administered 2012-06-02: 1 g via INTRAVENOUS
  Filled 2012-06-02: qty 10

## 2012-06-02 MED ORDER — SODIUM CHLORIDE 0.9 % IV BOLUS (SEPSIS)
1000.0000 mL | Freq: Once | INTRAVENOUS | Status: AC
  Administered 2012-06-02: 1000 mL via INTRAVENOUS

## 2012-06-02 MED ORDER — HYDROMORPHONE HCL PF 1 MG/ML IJ SOLN
0.5000 mg | Freq: Once | INTRAMUSCULAR | Status: AC
  Administered 2012-06-02: 0.5 mg via INTRAVENOUS
  Filled 2012-06-02: qty 1

## 2012-06-02 MED ORDER — ONDANSETRON 4 MG PO TBDP
8.0000 mg | ORAL_TABLET | Freq: Once | ORAL | Status: AC
  Administered 2012-06-02: 8 mg via ORAL
  Filled 2012-06-02: qty 2

## 2012-06-02 MED ORDER — SODIUM CHLORIDE 0.9 % IV SOLN: Freq: Once | INTRAVENOUS | Status: DC

## 2012-06-02 NOTE — ED Notes (Signed)
Pt st's pain is returning in abd.  Requesting more pain med.  Dr. Donette Larry made aware.

## 2012-06-02 NOTE — ED Notes (Signed)
Pt c/o right sided abd pain with N/V/D; pt sts pain x 6 days and N/V/D started today

## 2012-06-02 NOTE — ED Notes (Signed)
Pt returned from CT.  NG to low suction.  Family at bedside.

## 2012-06-02 NOTE — ED Notes (Signed)
Pt transported to radiology.

## 2012-06-02 NOTE — ED Provider Notes (Signed)
42 year old female comes in with fever, vomiting, diarrhea. She is also complaining of diffuse abdominal pain. Fevers been associated with chills and sweats. On exam, lungs are clear. She has right CVA tenderness. Abdomen is soft with tenderness across the lower abdomen without rebound or guarding. Bowel sounds are decreased. Workup shows a high anion gap metabolic acidosis and relatively elevated BUN indicating some degree of dehydration. She's getting IV fluids and workup is in progress.  I saw and evaluated the patient, reviewed the resident's note and I agree with the findings and plan.   Dione Booze, MD 06/03/12 0110

## 2012-06-02 NOTE — ED Notes (Signed)
NG placed in right nare without any difficulty.

## 2012-06-02 NOTE — ED Provider Notes (Signed)
History     CSN: [id number]  Arrival date & time 06/02/12  1338   First MD Initiated Contact with Patient 06/02/12 1611      Chief Complaint  Patient presents with  . Abdominal Pain  . Emesis  . Diarrhea    (Consider location/radiation/quality/duration/timing/severity/associated sxs/prior treatment) HPI Comments: 42 y.o. female who presents to the er w/ the cc of left sided flank pain, fever, n/v. She is now developing right lower abdominal pain. She state she was evaluated by her pcp a few days ago for possible UTI, but has not received results yet.   Patient is a 42 y.o. female presenting with general illness. The history is provided by the patient.  Illness Location:  Left flank, right lower abdomen  Severity:  Mild Onset quality:  Gradual Timing:  Constant Progression:  Worsening Chronicity:  New Associated symptoms: abdominal pain, fever, nausea and vomiting   Associated symptoms: no chest pain, no congestion, no cough, no diarrhea, no fatigue, no headaches, no rash and no wheezing     Past Medical History  Diagnosis Date  . Flaccid hemiplegia affecting dominant side   . Enthesopathy of hip region     RIGHT HIP PAIN WITH PAIN DOWN RIGHT LEG  . Lumbago     LOWER BACK  . Spastic hemiplegia affecting dominant side   . Panic disorder without agoraphobia   . Depression   . Anxiety   . Diabetes mellitus   . Hypertension   . Blood transfusion     IN 2012  AFTER C -SECTION  . Cerebral thrombosis with cerebral infarction JUNE 2011    RIGHT SIDED WEAKNESS ( ARM AND LEG ) AND SPASMS  . Stroke   . Right rotator cuff tear     PAIN IN RIGHT SHOULDER  . Ventral hernia     RIGHT LOWER QUADRANT-CAUSING SOME PAIN  . Cough     STARTED 09/24/11--NO OTHER SYMPTOMS.  Marland Kitchen Anemia     DURING MENSES--HAS HEAVY BLEEDING WITH PERODS  . Headache     MIGRAINES--NOT REALLY HEADACHE-MORE LIKE PRESSURE SENSATION IN HEAD-FEELS DIZZIY AND  FAINT AS THE PRESSURE RESOLVES  . Diabetic  neuropathy     BOTH FEET --COMES AND GOES  . Restless leg syndrome     DIAGNOSED BY SLEEP STUDY - PT TOLD SHE DID NOT HAVE SLEEP APNEA  . Rash     HANDS, ARMS --STATES HX OF RASH EVER SINCE CHILDBIRTH/PREGNANCY.  STATES THE RASH OFTEN OCCURS WHEN SHE IS REALLY STRESSED.  Marland Kitchen Hernia, incisional, RLQ, s/p lap repair Sep 2013 09/02/2011    Past Surgical History  Procedure Laterality Date  . Cesarean section  2012  . Uterine fibroid surgery      2 SURGERIES FOR FIBROIDS  . Ureter revision    . Ventral hernia repair  10/03/2011    Procedure: LAPAROSCOPIC VENTRAL HERNIA;  Surgeon: Ardeth Sportsman, MD;  Location: [facility];  Service: General;  Laterality: N/A;  . Hernia repair  10/03/11    ventral hernia repair    Family History  Problem Relation Age of Onset  . Diabetes Father   . Kidney disease Father   . Other Neg Hx   . Diabetes Maternal Grandmother   . Hyperlipidemia Paternal Grandmother   . Stroke Paternal Grandmother     History  Substance Use Topics  . Smoking status: Never Smoker   . Smokeless tobacco: Never Used  . Alcohol Use: No    OB History   Grav  Para Term Preterm Abortions TAB SAB Ect Mult Living   1 1 0 1 0 0 0 0 0 1       Review of Systems  Constitutional: Positive for fever and chills. Negative for fatigue.  HENT: Negative for congestion, facial swelling, drooling, neck pain and dental problem.   Eyes: Negative for pain, discharge and itching.  Respiratory: Negative for cough, choking, wheezing and stridor.   Cardiovascular: Negative for chest pain.  Gastrointestinal: Positive for nausea, vomiting and abdominal pain. Negative for diarrhea.  Endocrine: Negative for cold intolerance and heat intolerance.  Genitourinary: Negative for vaginal discharge, difficulty urinating and vaginal pain.  Skin: Negative for pallor and rash.  Neurological: Negative for dizziness, light-headedness and headaches.  Psychiatric/Behavioral: Negative for behavioral problems and  agitation.    Allergies  Contrast media; Iohexol; Midazolam hcl; Shellfish allergy; Avandia; Geodon; Kiwi extract; Metformin and related; and Other  Home Medications   Current Outpatient Rx  Name  Route  Sig  Dispense  Refill  . APIDRA 100 UNIT/ML injection   Subconjunctival   1-12 Units by Subconjunctival route 3 (three) times daily before meals.          Marland Kitchen aspirin 325 MG tablet   Oral   Take 325 mg by mouth daily.          . B-D INS SYR ULTRAFINE .3CC/30G 30G X 1/2" 0.3 ML MISC               . B-D ULTRAFINE III SHORT PEN 31G X 8 MM MISC               . BYSTOLIC 10 MG tablet   Oral   Take 10 mg by mouth daily.          . diclofenac sodium (VOLTAREN) 1 % GEL   Topical   Apply 2 g topically 4 (four) times daily.   2 Tube   2   . EXFORGE HCT 10-320-25 MG TABS   Oral   Take 1 tablet by mouth every evening.          . furosemide (LASIX) 40 MG tablet   Oral   Take 20-40 mg by mouth daily.         Marland Kitchen gabapentin (NEURONTIN) 300 MG capsule   Oral   Take 300 mg by mouth 3 (three) times daily.         . insulin glargine (LANTUS) 100 UNIT/ML injection   Subcutaneous   Inject 20-22 Units into the skin 2 (two) times daily. Inject 22 units in the morning and 20 units in the evening         . insulin lispro (HUMALOG) 100 UNIT/ML injection   Subcutaneous   Inject 6-10 Units into the skin 3 (three) times daily before meals. Pt on sliding scale         . INVOKANA 300 MG TABS   Oral   Take 300 mg by mouth daily.          . Multiple Vitamin (MULTIVITAMIN WITH MINERALS) TABS   Oral   Take 1 tablet by mouth daily.         . ONE TOUCH ULTRA TEST test strip               . promethazine (PHENERGAN) 12.5 MG tablet   Oral   Take 12.5 mg by mouth every 6 (six) hours as needed. For nausea         . tiZANidine (ZANAFLEX) 4 MG  capsule   Oral   Take 4 mg by mouth 2 (two) times daily.          . traMADol (ULTRAM) 50 MG tablet   Oral   Take 50  mg by mouth 3 (three) times daily.           BP 136/84  Pulse 116  Temp(Src) 101.6 F (38.7 C) (Oral)  Resp 22  SpO2 99%  Physical Exam  Constitutional: She is oriented to person, place, and time. She appears well-developed. No distress.  HENT:  Head: Normocephalic and atraumatic.  Eyes: Pupils are equal, round, and reactive to light. Right eye exhibits no discharge. Left eye exhibits no discharge.  Neck: Neck supple. No tracheal deviation present.  Cardiovascular: Normal rate.  Exam reveals no gallop and no friction rub.   Pulmonary/Chest: No stridor. No respiratory distress. She has no wheezes.  Abdominal: Soft. She exhibits no distension. There is tenderness (right sided abd pain mid to lower abd ttp (mild), negative murphy sign. ). There is no rebound.  Musculoskeletal: She exhibits no edema and no tenderness.  CVA ttp  Neurological: She is alert and oriented to person, place, and time.  Skin: Skin is warm. She is not diaphoretic.    ED Course  Procedures (including critical care time)  Labs Reviewed  CBC WITH DIFFERENTIAL - Abnormal; Notable for the following:    RBC 5.44 (*)    MCV 76.7 (*)    RDW 15.6 (*)    All other components within normal limits  COMPREHENSIVE METABOLIC PANEL - Abnormal; Notable for the following:    Potassium 5.2 (*)    CO2 17 (*)    Glucose, Bld 166 (*)    BUN 26 (*)    GFR calc non Af Amer 80 (*)    All other components within normal limits  LIPASE, BLOOD  URINALYSIS, ROUTINE W REFLEX MICROSCOPIC   No results found.  ECG shows sinus tachycardia, no inverted T waves, no pathologic ST waves, compared to ECG from 03/02/2012.  MDM  Concern for pyelo verse nephrolithiasis -- doubt nephrolithiasis at this time as pt's symptoms constant, does not appear to be colicky in nature, suspect pyelo, especially with CVA ttp. Pt does not have dysuria, but states at the end of her stream she feels "stabbing pains". She is febrile, tachy, having n/v --  will go ahead and tx with fluids and give IV abx (rocephin). Will hold on CT imaging at this time with urinary symptoms and right sided flank pain.   Pt's AAS shows obstruction -- NG is placed. Pt had CT scan done due to lactic acidosis -- and this showed resolution of obstruction, she had NG placed prior to this and had more than out of her NG. Her lactic acidosis is improving, and her symptoms are improving. We have talked to surgery team on call, Dr. Magnus Ivan, and he states based on CT imaging no acute surgical intervention is required (medicine admit team was requesting consult). Surgery team states they will evaluate pt in the morning. Pt is being admitted to the medicine team for further eval and care.   1. Partial small bowel obstruction             Bernadene Person, MD 06/03/12 2130

## 2012-06-02 NOTE — ED Notes (Signed)
Assisted Kim, RN with pt back to room from bathroom via wheelchair and then assisted Selena Batten, RN with procedure of NG placement and suction; pt now resting on stretcher with blankets and callbell in reach

## 2012-06-02 NOTE — ED Notes (Signed)
Due to elevated temp and tachycardia and RR22-24.  Added lactate acid level and will medicate with tylenol.

## 2012-06-03 ENCOUNTER — Encounter (HOSPITAL_COMMUNITY): Payer: Self-pay | Admitting: Internal Medicine

## 2012-06-03 ENCOUNTER — Other Ambulatory Visit: Payer: Self-pay | Admitting: Physical Medicine and Rehabilitation

## 2012-06-03 ENCOUNTER — Encounter (HOSPITAL_COMMUNITY)
Admission: EM | Disposition: A | Discharge status: Home / Self Care | Payer: Self-pay | Source: Home / Self Care | Attending: Internal Medicine

## 2012-06-03 DIAGNOSIS — R1031 Right lower quadrant pain: Secondary | ICD-10-CM

## 2012-06-03 DIAGNOSIS — K439 Ventral hernia without obstruction or gangrene: Secondary | ICD-10-CM

## 2012-06-03 DIAGNOSIS — K56609 Unspecified intestinal obstruction, unspecified as to partial versus complete obstruction: Secondary | ICD-10-CM

## 2012-06-03 DIAGNOSIS — R109 Unspecified abdominal pain: Secondary | ICD-10-CM

## 2012-06-03 DIAGNOSIS — E119 Type 2 diabetes mellitus without complications: Secondary | ICD-10-CM

## 2012-06-03 DIAGNOSIS — K922 Gastrointestinal hemorrhage, unspecified: Principal | ICD-10-CM

## 2012-06-03 DIAGNOSIS — Z8673 Personal history of transient ischemic attack (TIA), and cerebral infarction without residual deficits: Secondary | ICD-10-CM

## 2012-06-03 DIAGNOSIS — IMO0001 Reserved for inherently not codable concepts without codable children: Secondary | ICD-10-CM

## 2012-06-03 DIAGNOSIS — R112 Nausea with vomiting, unspecified: Secondary | ICD-10-CM | POA: Diagnosis present

## 2012-06-03 DIAGNOSIS — I10 Essential (primary) hypertension: Secondary | ICD-10-CM | POA: Diagnosis present

## 2012-06-03 DIAGNOSIS — G8929 Other chronic pain: Secondary | ICD-10-CM

## 2012-06-03 HISTORY — DX: Unspecified intestinal obstruction, unspecified as to partial versus complete obstruction: K56.609

## 2012-06-03 HISTORY — PX: ESOPHAGOGASTRODUODENOSCOPY: SHX5428

## 2012-06-03 LAB — COMPREHENSIVE METABOLIC PANEL
Alkaline Phosphatase: 42 U/L (ref 39–117)
BUN: 17 mg/dL (ref 6–23)
CO2: 23 mEq/L (ref 19–32)
GFR calc Af Amer: 86 mL/min — ABNORMAL LOW (ref >90)
GFR calc non Af Amer: 74 mL/min — ABNORMAL LOW (ref >90)
Glucose, Bld: 163 mg/dL — ABNORMAL HIGH (ref 70–99)
Potassium: 3.4 mEq/L — ABNORMAL LOW (ref 3.5–5.1)
Total Bilirubin: 0.3 mg/dL (ref 0.3–1.2)
Total Protein: 6.5 g/dL (ref 6.0–8.3)

## 2012-06-03 LAB — CBC
HCT: 32.9 % — ABNORMAL LOW (ref 36.0–46.0)
HCT: 33.7 % — ABNORMAL LOW (ref 36.0–46.0)
HCT: 32.6 % — ABNORMAL LOW (ref 36.0–46.0)
Hemoglobin: 11 g/dL — ABNORMAL LOW (ref 12.0–15.0)
Hemoglobin: 10.7 g/dL — ABNORMAL LOW (ref 12.0–15.0)
MCH: 25.9 pg — ABNORMAL LOW (ref 26.0–34.0)
MCH: 25.5 pg — ABNORMAL LOW (ref 26.0–34.0)
MCHC: 33.4 g/dL (ref 30.0–36.0)
MCHC: 32.8 g/dL (ref 30.0–36.0)
MCV: 77.8 fL — ABNORMAL LOW (ref 78.0–100.0)
Platelets: 270 10*3/uL (ref 150–400)
RBC: 4.19 MIL/uL (ref 3.87–5.11)
RDW: 15.9 % — ABNORMAL HIGH (ref 11.5–15.5)
WBC: 7.9 10*3/uL (ref 4.0–10.5)

## 2012-06-03 LAB — MAGNESIUM: Magnesium: 1.7 mg/dL (ref 1.5–2.5)

## 2012-06-03 LAB — GLUCOSE, CAPILLARY
Glucose-Capillary: 153 mg/dL — ABNORMAL HIGH (ref 70–99)
Glucose-Capillary: 141 mg/dL — ABNORMAL HIGH (ref 70–99)
Glucose-Capillary: 120 mg/dL — ABNORMAL HIGH (ref 70–99)

## 2012-06-03 LAB — HEPATIC FUNCTION PANEL
ALT: 8 U/L (ref 0–35)
AST: 12 U/L (ref 0–37)
Total Protein: 6.4 g/dL (ref 6.0–8.3)

## 2012-06-03 LAB — URINE CULTURE
Colony Count: NO GROWTH
Culture: NO GROWTH

## 2012-06-03 LAB — TYPE AND SCREEN

## 2012-06-03 LAB — ABO/RH: ABO/RH(D): O POS

## 2012-06-03 LAB — PROTIME-INR: INR: 1.04 (ref 0.00–1.49)

## 2012-06-03 SURGERY — EGD (ESOPHAGOGASTRODUODENOSCOPY)
Anesthesia: Moderate Sedation

## 2012-06-03 MED ORDER — SODIUM CHLORIDE 0.9 % IV SOLN
8.0000 mg/h | INTRAVENOUS | Status: DC
  Administered 2012-06-03 (×2): 8 mg/h via INTRAVENOUS
  Filled 2012-06-03 (×9): qty 80

## 2012-06-03 MED ORDER — ONDANSETRON HCL 4 MG PO TABS
4.0000 mg | ORAL_TABLET | Freq: Four times a day (QID) | ORAL | Status: DC | PRN
  Administered 2012-06-03: 4 mg via ORAL
  Filled 2012-06-03: qty 1

## 2012-06-03 MED ORDER — MAGNESIUM SULFATE 40 MG/ML IJ SOLN
2.0000 g | Freq: Once | INTRAMUSCULAR | Status: AC
  Administered 2012-06-03: 2 g via INTRAVENOUS
  Filled 2012-06-03: qty 50

## 2012-06-03 MED ORDER — SODIUM CHLORIDE 0.9 % IJ SOLN
3.0000 mL | Freq: Two times a day (BID) | INTRAMUSCULAR | Status: DC
  Administered 2012-06-03 – 2012-06-04 (×2): 3 mL via INTRAVENOUS

## 2012-06-03 MED ORDER — FENTANYL CITRATE 0.05 MG/ML IJ SOLN
INTRAMUSCULAR | Status: AC
  Filled 2012-06-03: qty 2

## 2012-06-03 MED ORDER — SACCHAROMYCES BOULARDII 250 MG PO CAPS
250.0000 mg | ORAL_CAPSULE | Freq: Two times a day (BID) | ORAL | Status: DC
  Administered 2012-06-03 – 2012-06-05 (×5): 250 mg via ORAL
  Filled 2012-06-03 (×6): qty 1

## 2012-06-03 MED ORDER — ONDANSETRON HCL 4 MG/2ML IJ SOLN
4.0000 mg | Freq: Four times a day (QID) | INTRAMUSCULAR | Status: DC | PRN
  Administered 2012-06-03: 4 mg via INTRAVENOUS
  Filled 2012-06-03: qty 2

## 2012-06-03 MED ORDER — SODIUM CHLORIDE 0.9 % IV SOLN: INTRAVENOUS | Status: DC

## 2012-06-03 MED ORDER — FENTANYL CITRATE 0.05 MG/ML IJ SOLN
INTRAMUSCULAR | Status: DC | PRN
  Administered 2012-06-03 (×4): 25 ug via INTRAVENOUS

## 2012-06-03 MED ORDER — HYDROMORPHONE HCL PF 1 MG/ML IJ SOLN
0.5000 mg | Freq: Once | INTRAMUSCULAR | Status: AC
  Administered 2012-06-03: 0.5 mg via INTRAVENOUS
  Filled 2012-06-03: qty 1

## 2012-06-03 MED ORDER — PANTOPRAZOLE SODIUM 40 MG IV SOLR
80.0000 mg | Freq: Once | INTRAVENOUS | Status: AC
  Administered 2012-06-03: 80 mg via INTRAVENOUS
  Filled 2012-06-03: qty 80

## 2012-06-03 MED ORDER — INSULIN GLARGINE 100 UNIT/ML ~~LOC~~ SOLN
10.0000 [IU] | Freq: Two times a day (BID) | SUBCUTANEOUS | Status: DC
  Administered 2012-06-03 – 2012-06-05 (×5): 10 [IU] via SUBCUTANEOUS
  Filled 2012-06-03 (×7): qty 0.1

## 2012-06-03 MED ORDER — SODIUM CHLORIDE 0.9 % IV SOLN
INTRAVENOUS | Status: AC
  Administered 2012-06-03: 07:00:00 via INTRAVENOUS

## 2012-06-03 MED ORDER — MIDAZOLAM HCL 5 MG/ML IJ SOLN
INTRAMUSCULAR | Status: AC
  Filled 2012-06-03: qty 2

## 2012-06-03 MED ORDER — MORPHINE SULFATE 2 MG/ML IJ SOLN
2.0000 mg | INTRAMUSCULAR | Status: DC | PRN
  Administered 2012-06-03 – 2012-06-05 (×3): 2 mg via INTRAVENOUS
  Filled 2012-06-03 (×3): qty 1

## 2012-06-03 MED ORDER — HYDROCODONE-ACETAMINOPHEN 5-325 MG PO TABS
1.0000 | ORAL_TABLET | ORAL | Status: DC | PRN
  Administered 2012-06-03 – 2012-06-05 (×6): 2 via ORAL
  Filled 2012-06-03 (×6): qty 2

## 2012-06-03 MED ORDER — SODIUM CHLORIDE 0.9 % IV BOLUS (SEPSIS)
1000.0000 mL | Freq: Once | INTRAVENOUS | Status: AC
  Administered 2012-06-03: 1000 mL via INTRAVENOUS

## 2012-06-03 MED ORDER — POTASSIUM CHLORIDE 10 MEQ/100ML IV SOLN
10.0000 meq | INTRAVENOUS | Status: AC
  Administered 2012-06-03 (×4): 10 meq via INTRAVENOUS
  Filled 2012-06-03: qty 400

## 2012-06-03 MED ORDER — DOCUSATE SODIUM 100 MG PO CAPS
100.0000 mg | ORAL_CAPSULE | Freq: Two times a day (BID) | ORAL | Status: DC
  Administered 2012-06-04 – 2012-06-05 (×3): 100 mg via ORAL
  Filled 2012-06-03 (×6): qty 1

## 2012-06-03 MED ORDER — INSULIN ASPART 100 UNIT/ML ~~LOC~~ SOLN
0.0000 [IU] | SUBCUTANEOUS | Status: DC
  Administered 2012-06-03: 2 [IU] via SUBCUTANEOUS
  Administered 2012-06-03: 1 [IU] via SUBCUTANEOUS
  Administered 2012-06-03: 2 [IU] via SUBCUTANEOUS

## 2012-06-03 NOTE — Consult Note (Signed)
Unassigned patient.  Reason for Consult: Hematemesis/CGE. Referring Physician: THP  Tina [name] is an 42 y.o. female.  HPI: 42 year old black female on Aspirin after an embolic stroke, present to the ER with abdominal pain, fever and chills. NGT inserted for reasons not clear to me as she obviously does not have a bowel obstruction. Bloody return noted on NG suction followed by CGE. There is no previous history of ulcer disease.   Past Medical History  Diagnosis Date  . Flaccid hemiplegia affecting dominant side   . Enthesopathy of hip region     RIGHT HIP PAIN WITH PAIN DOWN RIGHT LEG  . Lumbago     LOWER BACK  . Spastic hemiplegia affecting dominant side   . Panic disorder without agoraphobia   . Depression   . Anxiety   . Diabetes mellitus   . Hypertension   . Blood transfusion     IN 2012  AFTER C -SECTION  . Cerebral thrombosis with cerebral infarction JUNE 2011    RIGHT SIDED WEAKNESS ( ARM AND LEG ) AND SPASMS  . Stroke   . Right rotator cuff tear     PAIN IN RIGHT SHOULDER  . Ventral hernia     RIGHT LOWER QUADRANT-CAUSING SOME PAIN  . Cough     STARTED 09/24/11--NO OTHER SYMPTOMS.  Marland Kitchen Anemia     DURING MENSES--HAS HEAVY BLEEDING WITH PERODS  . Headache     MIGRAINES--NOT REALLY HEADACHE-MORE LIKE PRESSURE SENSATION IN HEAD-FEELS DIZZIY AND  FAINT AS THE PRESSURE RESOLVES  . Diabetic neuropathy     BOTH FEET --COMES AND GOES  . Restless leg syndrome     DIAGNOSED BY SLEEP STUDY - PT TOLD SHE DID NOT HAVE SLEEP APNEA  . Rash     HANDS, ARMS --STATES HX OF RASH EVER SINCE CHILDBIRTH/PREGNANCY.  STATES THE RASH OFTEN OCCURS WHEN SHE IS REALLY STRESSED.  Marland Kitchen Hernia, incisional, RLQ, s/p lap repair Sep 2013 09/02/2011    Past Surgical History  Procedure Laterality Date  . Cesarean section  2012  . Uterine fibroid surgery      2 SURGERIES FOR FIBROIDS  . Ureter revision    . Ventral hernia repair  10/03/2011    Procedure: LAPAROSCOPIC VENTRAL HERNIA;  Surgeon:  Ardeth Sportsman, MD;  Location: [facility];  Service: General;  Laterality: N/A;  . Hernia repair  10/03/11    ventral hernia repair    Family History  Problem Relation Age of Onset  . Diabetes Father   . Kidney disease Father   . Other Neg Hx   . Diabetes Maternal Grandmother   . Hyperlipidemia Paternal Grandmother   . Stroke Paternal Grandmother    Social History:  reports that she has never smoked. She has never used smokeless tobacco. She reports that she does not drink alcohol or use illicit drugs.  Allergies:  Allergies  Allergen Reactions  . Contrast Media (Iodinated Diagnostic Agents) Other (See Comments)    Difficulty breathing  . Iohexol Hives, Nausea And Vomiting and Swelling     Desc: Magnevist-gadolinium-difficulty breathing, throat swelling   . Midazolam Hcl Anaphylaxis    Difficulty breathing  . Shellfish Allergy Anaphylaxis  . Avandia (Rosiglitazone Maleate)     Patient doesn't remember reaction  . Geodon (Ziprasidone)   . Kiwi Extract Itching and Swelling  . Metformin And Related Nausea And Vomiting and Other (See Comments)    diarrhea  . Other Itching    Patient is allergic to  all nuts except peanuts.    Medications: I have reviewed the patient's current medications.  Results for orders placed during the hospital encounter of 06/02/12 (from the past 48 hour(s))  CBC WITH DIFFERENTIAL     Status: Abnormal   Collection Time    06/02/12  2:40 PM      Result Value Range   WBC 9.9  4.0 - 10.5 K/uL   RBC 5.44 (*) 3.87 - 5.11 MIL/uL   Hemoglobin 14.2  12.0 - 15.0 g/dL   HCT 40.9  81.1 - 91.4 %   MCV 76.7 (*) 78.0 - 100.0 fL   MCH 26.1  26.0 - 34.0 pg   MCHC 34.1  30.0 - 36.0 g/dL   RDW 78.2 (*) 95.6 - 21.3 %   Platelets PLATELET CLUMPS NOTED ON SMEAR, UNABLE TO ESTIMATE  150 - 400 K/uL   Neutrophils Relative % 87 (*) 43 - 77 %   Lymphocytes Relative 4 (*) 12 - 46 %   Monocytes Relative 5  3 - 12 %   Eosinophils Relative 4  0 - 5 %   Basophils Relative 0   0 - 1 %   Neutro Abs 8.6 (*) 1.7 - 7.7 K/uL   Lymphs Abs 0.4 (*) 0.7 - 4.0 K/uL   Monocytes Absolute 0.5  0.1 - 1.0 K/uL   Eosinophils Absolute 0.4  0.0 - 0.7 K/uL   Basophils Absolute 0.0  0.0 - 0.1 K/uL   WBC Morphology FEW NEUTROPHIL BANDS NOTED    COMPREHENSIVE METABOLIC PANEL     Status: Abnormal   Collection Time    06/02/12  2:40 PM      Result Value Range   Sodium 140  135 - 145 mEq/L   Potassium 5.2 (*) 3.5 - 5.1 mEq/L   Comment: HEMOLYSIS AT THIS LEVEL MAY AFFECT RESULT   Chloride 102  96 - 112 mEq/L   CO2 17 (*) 19 - 32 mEq/L   Glucose, Bld 166 (*) 70 - 99 mg/dL   BUN 26 (*) 6 - 23 mg/dL   Creatinine, Ser 0.86  0.50 - 1.10 mg/dL   Calcium 9.3  8.4 - 57.8 mg/dL   Total Protein 7.9  6.0 - 8.3 g/dL   Albumin 3.7  3.5 - 5.2 g/dL   AST 31  0 - 37 U/L   ALT 14  0 - 35 U/L   Alkaline Phosphatase 50  39 - 117 U/L   Total Bilirubin 0.4  0.3 - 1.2 mg/dL   GFR calc non Af Amer 80 (*) >90 mL/min   GFR calc Af Amer >90  >90 mL/min   Comment:            The eGFR has been calculated     using the CKD EPI equation.     This calculation has not been     validated in all clinical     situations.     eGFR's persistently     <90 mL/min signify     possible Chronic Kidney Disease.  LIPASE, BLOOD     Status: None   Collection Time    06/02/12  2:40 PM      Result Value Range   Lipase 22  11 - 59 U/L  URINALYSIS, ROUTINE W REFLEX MICROSCOPIC     Status: Abnormal   Collection Time    06/02/12  5:01 PM      Result Value Range   Color, Urine YELLOW  YELLOW   APPearance  CLEAR  CLEAR   Specific Gravity, Urine 1.026  1.005 - 1.030   pH 5.0  5.0 - 8.0   Glucose, UA >1000 (*) NEGATIVE mg/dL   Hgb urine dipstick LARGE (*) NEGATIVE   Bilirubin Urine NEGATIVE  NEGATIVE   Ketones, ur NEGATIVE  NEGATIVE mg/dL   Protein, ur NEGATIVE  NEGATIVE mg/dL   Urobilinogen, UA 0.2  0.0 - 1.0 mg/dL   Nitrite NEGATIVE  NEGATIVE   Leukocytes, UA NEGATIVE  NEGATIVE  URINE MICROSCOPIC-ADD ON      Status: None   Collection Time    06/02/12  5:01 PM      Result Value Range   Squamous Epithelial / LPF RARE  RARE   WBC, UA 0-2  <3 WBC/hpf   RBC / HPF 21-50  <3 RBC/hpf  POCT PREGNANCY, URINE     Status: None   Collection Time    06/02/12  5:14 PM      Result Value Range   Preg Test, Ur NEGATIVE  NEGATIVE   Comment:            THE SENSITIVITY OF THIS     METHODOLOGY IS >24 mIU/mL  LACTIC ACID, PLASMA     Status: Abnormal   Collection Time    06/02/12  5:24 PM      Result Value Range   Lactic Acid, Venous 4.5 (*) 0.5 - 2.2 mmol/L  BASIC METABOLIC PANEL     Status: Abnormal   Collection Time    06/02/12  5:34 PM      Result Value Range   Sodium 140  135 - 145 mEq/L   Potassium 4.1  3.5 - 5.1 mEq/L   Comment: DELTA CHECK NOTED   Chloride 102  96 - 112 mEq/L   CO2 21  19 - 32 mEq/L   Glucose, Bld 204 (*) 70 - 99 mg/dL   BUN 26 (*) 6 - 23 mg/dL   Creatinine, Ser 0.98  0.50 - 1.10 mg/dL   Calcium 8.8  8.4 - 11.9 mg/dL   GFR calc non Af Amer 63 (*) >90 mL/min   GFR calc Af Amer 73 (*) >90 mL/min   Comment:            The eGFR has been calculated     using the CKD EPI equation.     This calculation has not been     validated in all clinical     situations.     eGFR's persistently     <90 mL/min signify     possible Chronic Kidney Disease.  CG4 I-STAT (LACTIC ACID)     Status: Abnormal   Collection Time    06/02/12  5:53 PM      Result Value Range   Lactic Acid, Venous 4.62 (*) 0.5 - 2.2 mmol/L  LACTIC ACID, PLASMA     Status: Abnormal   Collection Time    06/02/12 10:55 PM      Result Value Range   Lactic Acid, Venous 2.5 (*) 0.5 - 2.2 mmol/L  HEPATIC FUNCTION PANEL     Status: Abnormal   Collection Time    06/03/12  2:20 AM      Result Value Range   Total Protein 6.4  6.0 - 8.3 g/dL   Albumin 2.9 (*) 3.5 - 5.2 g/dL   AST 12  0 - 37 U/L   ALT 8  0 - 35 U/L   Alkaline Phosphatase 41  39 - 117 U/L  Total Bilirubin 0.3  0.3 - 1.2 mg/dL   Bilirubin, Direct <4.5   0.0 - 0.3 mg/dL   Indirect Bilirubin NOT CALCULATED  0.3 - 0.9 mg/dL  CBC     Status: Abnormal   Collection Time    06/03/12  2:20 AM      Result Value Range   WBC 8.5  4.0 - 10.5 K/uL   RBC 4.24  3.87 - 5.11 MIL/uL   Hemoglobin 11.0 (*) 12.0 - 15.0 g/dL   Comment: DELTA CHECK NOTED     REPEATED TO VERIFY   HCT 32.9 (*) 36.0 - 46.0 %   MCV 77.6 (*) 78.0 - 100.0 fL   MCH 25.9 (*) 26.0 - 34.0 pg   MCHC 33.4  30.0 - 36.0 g/dL   RDW 40.9 (*) 81.1 - 91.4 %   Platelets 266  150 - 400 K/uL  PROTIME-INR     Status: None   Collection Time    06/03/12  2:20 AM      Result Value Range   Prothrombin Time 13.5  11.6 - 15.2 seconds   INR 1.04  0.00 - 1.49  TYPE AND SCREEN     Status: None   Collection Time    06/03/12  2:25 AM      Result Value Range   ABO/RH(D) O POS     Antibody Screen NEG     Sample Expiration 06/06/2012    ABO/RH     Status: None   Collection Time    06/03/12  2:25 AM      Result Value Range   ABO/RH(D) O POS    GLUCOSE, CAPILLARY     Status: Abnormal   Collection Time    06/03/12  3:48 AM      Result Value Range   Glucose-Capillary 153 (*) 70 - 99 mg/dL  MRSA PCR SCREENING     Status: None   Collection Time    06/03/12  4:24 AM      Result Value Range   MRSA by PCR NEGATIVE  NEGATIVE   Comment:            The GeneXpert MRSA Assay (FDA     approved for NASAL specimens     only), is one component of a     comprehensive MRSA colonization     surveillance program. It is not     intended to diagnose MRSA     infection nor to guide or     monitor treatment for     MRSA infections.  MAGNESIUM     Status: None   Collection Time    06/03/12  5:20 AM      Result Value Range   Magnesium 1.7  1.5 - 2.5 mg/dL  PHOSPHORUS     Status: None   Collection Time    06/03/12  5:20 AM      Result Value Range   Phosphorus 2.6  2.3 - 4.6 mg/dL  COMPREHENSIVE METABOLIC PANEL     Status: Abnormal   Collection Time    06/03/12  5:20 AM      Result Value Range    Sodium 144  135 - 145 mEq/L   Potassium 3.4 (*) 3.5 - 5.1 mEq/L   Comment: DELTA CHECK NOTED   Chloride 109  96 - 112 mEq/L   CO2 23  19 - 32 mEq/L   Glucose, Bld 163 (*) 70 - 99 mg/dL   BUN 17  6 - 23 mg/dL  Creatinine, Ser 0.94  0.50 - 1.10 mg/dL   Calcium 7.8 (*) 8.4 - 10.5 mg/dL   Total Protein 6.5  6.0 - 8.3 g/dL   Albumin 2.8 (*) 3.5 - 5.2 g/dL   AST 10  0 - 37 U/L   ALT 8  0 - 35 U/L   Alkaline Phosphatase 42  39 - 117 U/L   Total Bilirubin 0.3  0.3 - 1.2 mg/dL   GFR calc non Af Amer 74 (*) >90 mL/min   GFR calc Af Amer 86 (*) >90 mL/min   Comment:            The eGFR has been calculated     using the CKD EPI equation.     This calculation has not been     validated in all clinical     situations.     eGFR's persistently     <90 mL/min signify     possible Chronic Kidney Disease.  CBC     Status: Abnormal   Collection Time    06/03/12  5:20 AM      Result Value Range   WBC 7.9  4.0 - 10.5 K/uL   RBC 4.33  3.87 - 5.11 MIL/uL   Hemoglobin 11.1 (*) 12.0 - 15.0 g/dL   HCT 45.4 (*) 09.8 - 11.9 %   MCV 77.8 (*) 78.0 - 100.0 fL   MCH 25.6 (*) 26.0 - 34.0 pg   MCHC 32.9  30.0 - 36.0 g/dL   RDW 14.7 (*) 82.9 - 56.2 %   Platelets 270  150 - 400 K/uL    Ct Abdomen Pelvis Wo Contrast  06/02/2012   *RADIOLOGY REPORT*  Clinical Data: Severe abdominal pain.  Nausea and vomiting. Previous hernia surgery.Suspect small bowel obstruction on abdominal radiographs.  The  CT ABDOMEN AND PELVIS WITHOUT CONTRAST  Technique:  Multidetector CT imaging of the abdomen and pelvis was performed following the standard protocol without intravenous contrast.  Comparison: 10/15/2011 and 04/04/2004  Findings: Nasogastric tube is seen within the stomach.  There is no evidence of dilated bowel loops or transition point.  Contrast material is seen throughout the colon.  Mild hepatic steatosis again demonstrated.  Gallbladder is unremarkable.  The pancreas, spleen, and adrenal glands have a normal  appearance on this noncontrast study as well as the gallbladder.  Bilateral renal parenchymal scarring is stable.  No evidence of renal calculi or hydronephrosis.  No evidence of ureteral calculi or dilatation.  Small uterine fibroids again noted. No evidence of inflammatory process or abnormal fluid collections.  Surgical mesh is seen in the lower anterior abdominal wall soft tissues.  A right lower quadrant ventral abdominal wall hernia is seen containing several loops of small bowel.  No evidence of bowel obstruction or ischemia.  IMPRESSION:  1.  No evidence of small bowel obstruction or other acute findings. 2.  Small right lower quadrant ventral abdominal wall hernia containing small bowel. 3.  Stable small uterine fibroids. 4.  Stable mild hepatic steatosis and bilateral renal scarring.   Original Report Authenticated By: Myles Rosenthal, M.D.   Dg Abd Acute W/chest  06/02/2012   *RADIOLOGY REPORT*  Clinical Data: Right lower quadrant pain.  Nausea vomiting. Diarrhea.  ACUTE ABDOMEN SERIES (ABDOMEN 2 VIEW & CHEST 1 VIEW)  Comparison: 08/01/2011  Findings: Mildly dilated small bowel loops containing air fluid levels.  There is a paucity of colonic gas.  This is suspicious for a partial or low grade small bowel obstruction.  There is no evidence of free air.  Surgical mesh seen in the lower abdominal wall.  Heart size is normal.  Both lungs are clear.  No evidence of pleural effusion.  IMPRESSION:  1.  Findings consistent with partial or low grade small bowel obstruction. 2.  No active cardiopulmonary disease.   Original Report Authenticated By: Myles Rosenthal, M.D.   Review of Systems  Constitutional: Positive for fever, chills, malaise/fatigue and diaphoresis. Negative for weight loss.  Eyes: Negative.   Cardiovascular: Negative.   Gastrointestinal: Positive for nausea, vomiting, abdominal pain and diarrhea. Negative for heartburn and blood in stool.  Musculoskeletal: Positive for back pain and joint pain.   Skin: Positive for rash.  Neurological: Positive for focal weakness, weakness and headaches. Negative for seizures and loss of consciousness.   Blood pressure 131/63, pulse 98, temperature 99.1 F (37.3 C), temperature source Oral, resp. rate 30, height 5\' 6"  (1.676 m), weight 107 kg (235 lb 14.3 oz), last menstrual period 06/02/2012, SpO2 98.00%. Physical Exam  Constitutional: She is oriented to person, place, and time. She appears well-developed and well-nourished.  HENT:  Head: Normocephalic and atraumatic.  Eyes: Conjunctivae are normal. Pupils are equal, round, and reactive to light.  Neck: Normal range of motion. Neck supple.  Cardiovascular: Normal rate and regular rhythm.   Respiratory: Effort normal and breath sounds normal.  GI: Soft. Bowel sounds are normal. She exhibits no distension and no mass. There is tenderness. There is guarding. There is no rebound.  Neurological: She is alert and oriented to person, place, and time.  Skin: Skin is warm and dry.   Assessment/Plan: 1) Hematemesis/CGE: Will proceed with an EGD at this time. I am not sure if this is due to NGT trauma or a true UGI bleed.  2) Abdominal pain-RLQ with fever and chills: etiology unclear.  3) Fatty liver on CT.  Lucee Brissett 06/03/2012, 6:37 AM

## 2012-06-03 NOTE — Progress Notes (Signed)
[facility] Progress Note [place] TEAM 1 - Stepdown/ICU TEAM   Tina [name] RUE:[id number] DOB: [birth year] DOA: 06/02/2012 PCP: Michiel Sites, MD  Brief narrative: 42 year old female patient with multiple medical problems. Known ventral hernia with previous mesh placement. Endorsed one week history of right lower quadrant abdominal pain and subsequently developed nausea and vomiting 24 hours prior to presentation as well significant diarrhea. She presented to the emergency department where she was found to have elevated serum lactate and felt to be dehydrated. In addition plain films apparently revealed a small bowel obstruction. An NG tube was placed and a CT scan without contrast was completed that showed no evidence of bowel obstruction or any acute changes. Her ventral hernia containing small bowel but no evidence of incarceration. After NG was placed patient endorsed improvement in symptoms. At presentation shows had fever 101.6. Urinalysis was apparently abnormal and concerning for a UTI also patient was given Rocephin in the emergency department. By the time the admitting physician arrived the patient's NG tube is draining bloody secretions which was concerning for acute GI bleeding. Dr. Loreta Ave with gastroenterology was notified and since patient hemodynamically stable and not a large amount of bloody drainage it was felt patient could undergo GI evaluation in the morning. The ER physician also spoke with general surgery regarding followup of the ventral hernia.  Assessment/Plan: Principal Problem:   ? GI bleed -EGD only revealed several areas of gastric erythema -hgb stable -GI rec leave NG out and continue PPI  Active Problems:   Nausea with vomiting -surgery surmises due to AGE given presenting sx's -Urinalysis not consistent with UTI    Diabetes mellitus type II, uncontrolled -A1c in 2013 8.3 -CBG controlled here -cont SSI and Lantus    Abdominal pain, chronic,  right lower quadrant/Ventral hernia -no evidence of incarcerated hernia on imaging -appreciate surgery team assistance    Obesity (BMI 30-39.9)    History of CVA (cerebrovascular accident)    HTN (hypertension) -BP controlled   DVT prophylaxis: SCDs Code Status: Full Family Communication: Patient Disposition Plan: Transfer to floor Isolation: None  Consultants: Gastroenterology General surgery  Procedures: EGD: Multiple gastric erythema but no ulcerations  Antibiotics: Rocephin x1 dose and emergency department  HPI/Subjective: Patient endorses improved right lower quadrant pain after pain medications. Denies issues with recurrent diarrhea or nausea vomiting since admission. He could start clear liquid diet.   Objective: Blood pressure 111/58, pulse 76, temperature 97.7 F (36.5 C), temperature source Oral, resp. rate 25, height 5\' 6"  (1.676 m), weight 107 kg (235 lb 14.3 oz), last menstrual period 06/02/2012, SpO2 98.00%.  Intake/Output Summary (Last 24 hours) at 06/03/12 1202 Last data filed at 06/03/12 1134  Gross per 24 hour  Intake 3049.17 ml  Output   1900 ml  Net 1149.17 ml     Exam: Follow up exam completed. Patient admitted today at 1:31 AM.  Scheduled Meds: Scheduled Meds: . docusate sodium  100 mg Oral BID  . insulin aspart  0-9 Units Subcutaneous Q4H  . insulin glargine  10 Units Subcutaneous BID  . saccharomyces boulardii  250 mg Oral BID  . sodium chloride  3 mL Intravenous Q12H   Continuous Infusions: . sodium chloride 100 mL/hr at 06/03/12 0700  . pantoprozole (PROTONIX) infusion 8 mg/hr (06/03/12 1100)    Data Reviewed: Basic Metabolic Panel:  Recent Labs Lab 06/02/12 1440 06/02/12 1734 06/03/12 0520  NA 140 140 144  K 5.2* 4.1 3.4*  CL 102 102 109  CO2  17* 21 23  GLUCOSE 166* 204* 163*  BUN 26* 26* 17  CREATININE 0.88 1.07 0.94  CALCIUM 9.3 8.8 7.8*  MG  --   --  1.7  PHOS  --   --  2.6   Liver Function Tests:  Recent  Labs Lab 06/02/12 1440 06/03/12 0220 06/03/12 0520  AST 31 12 10   ALT 14 8 8   ALKPHOS 50 41 42  BILITOT 0.4 0.3 0.3  PROT 7.9 6.4 6.5  ALBUMIN 3.7 2.9* 2.8*    Recent Labs Lab 06/02/12 1440  LIPASE 22   No results found for this basename: AMMONIA,  in the last 168 hours CBC:  Recent Labs Lab 06/02/12 1440 06/03/12 0220 06/03/12 0520 06/03/12 0752  WBC 9.9 8.5 7.9 7.7  NEUTROABS 8.6*  --   --   --   HGB 14.2 11.0* 11.1* 10.7*  HCT 41.7 32.9* 33.7* 32.6*  MCV 76.7* 77.6* 77.8* 77.8*  PLT PLATELET CLUMPS NOTED ON SMEAR, UNABLE TO ESTIMATE 266 270 262   Cardiac Enzymes: No results found for this basename: CKTOTAL, CKMB, CKMBINDEX, TROPONINI,  in the last 168 hours BNP (last 3 results) No results found for this basename: PROBNP,  in the last 8760 hours CBG:  Recent Labs Lab 06/03/12 0348 06/03/12 0735 06/03/12 1134  GLUCAP 153* 153* 106*    Recent Results (from the past 240 hour(s))  MRSA PCR SCREENING     Status: None   Collection Time    06/03/12  4:24 AM      Result Value Range Status   MRSA by PCR NEGATIVE  NEGATIVE Final   Comment:            The GeneXpert MRSA Assay (FDA     approved for NASAL specimens     only), is one component of a     comprehensive MRSA colonization     surveillance program. It is not     intended to diagnose MRSA     infection nor to guide or     monitor treatment for     MRSA infections.     Studies:  Recent x-ray studies have been reviewed in detail by the Attending Physician  Scheduled Meds:  Reviewed in detail by the Attending Physician   Junious Silk, ANP [facility] Office  [phone] Pager [phone]  On-Call/Text Page:      [email or web address]      password [email or web address]  If 7PM-7AM, please contact night-coverage [email or web address] Password [email or web address] 06/03/2012, 12:02 PM   LOS: 1 day   I have examined the patient, reviewed the chart and modified the above note which I agree with.    Makell Cyr,MD [phone] 06/03/2012, 3:02 PM

## 2012-06-03 NOTE — Progress Notes (Signed)
Reported off to oncoming shift RN. Safety maintained. NO acute distress noted.

## 2012-06-03 NOTE — Progress Notes (Signed)
Analissa NECOLA BLUESTEIN [id number] 11/10/1970  CARE TEAM:  PCP: Michiel Sites, MD  Outpatient Care Team: Patient Care Team: [name] Needle, MD as PCP - General Sheronette Cathie Beams, MD as Consulting Physician (Obstetrics and Gynecology) Micki Riley, MD as Consulting Physician (Neurology)  Inpatient Treatment Team: Treatment Team: Attending Provider: Lonia Blood, MD; Resident: Bernadene Person, MD; Rounding Team: Sheppard Penton, MD; Consulting Physician: Md Ccs, MD   Subjective:  Hematemesis - EGD prob NGT stomach ulcerations - better w NGT out Tired but stable in ICU/SDU No N/V RN at bedside  Objective:  Vital signs:  Filed Vitals:   06/03/12 0710 06/03/12 0715 06/03/12 0720 06/03/12 0737  BP: 125/47 121/51 135/45   Pulse: 93 92 92   Temp:    98.6 F (37 C)  TempSrc:    Oral  Resp: 24 17 11    Height:      Weight:      SpO2: 99% 100% 100%     Last BM Date: 06/03/12  Intake/Output   Yesterday:  05/21 0701 - 05/22 0700 In: 2439.2 [I.V.:2409.2; NG/GT:30] Out: 1900 [Urine:1000; Emesis/NG output:150] This shift:     Bowel function:  Flatus: y  BM: y, loose many  Drain: n/a  Physical Exam:  General: Pt awake/alert/oriented x4 in no acute distress but tired.  Smiling Eyes: PERRL, normal EOM.  Sclera clear.  No icterus Neuro: CN II-XII intact w/o focal sensory/motor deficits. Lymph: No head/neck/groin lymphadenopathy Psych:  No delerium/psychosis/paranoia HENT: Normocephalic, Mucus membranes moist.  No thrush Neck: Supple, No tracheal deviation Chest: No chest wall pain w good excursion CV:  Pulses intact.  Regular rhythm MS: Normal AROM mjr joints.  No obvious deformity Abdomen: Soft.  Nondistended.  Obese.   Mildly tender R abdomen.  No evidence of peritonitis.  No incarcerated hernias. Ext:  SCDs BLE.  No mjr edema.  No cyanosis Skin: No petechiae / purpura   Problem List:   Principal Problem:   GI bleed Active Problems:   Obesity (BMI  30-39.9)   DM2 (diabetes mellitus, type 2)   Abdominal pain, chronic, right lower quadrant   Assessment  Jamesetta M Scardino  42 y.o. female  Day of Surgery  Procedure(s): ESOPHAGOGASTRODUODENOSCOPY (EGD)  Stabilizing - favor gastroenteritis  Plan:  -ivf resucitation -try sips -bowel regimen w probiotic -replace low potassium & magnesium - follow -PPI for ulceration -VTE prophylaxis- SCDs, etc -mobilize as tolerated to help recovery  I think there is mild eventration & remaining old hernia sac s/p lap VWH of ## hernias using GIANT sheet of mesh.  Not infected nor evidence of SBO.  Picture more c/w gastroenteritis  The patient is stable.  There is no evidence of peritonitis, acute abdomen, nor shock.  There is no strong evidence of failure of improvement nor decline with current non-operative management.  There is no need for surgery at the present moment.  We will continue to follow.  Ardeth Sportsman, M.D., F.A.C.S. Gastrointestinal and Minimally Invasive Surgery [facility] [address], [address] [place], [place] [address] [phone] Main / Paging   06/03/2012   Results:   Labs: Results for orders placed during the hospital encounter of 06/02/12 (from the past 48 hour(s))  CBC WITH DIFFERENTIAL     Status: Abnormal   Collection Time    06/02/12  2:40 PM      Result Value Range   WBC 9.9  4.0 - 10.5 K/uL   RBC 5.44 (*) 3.87 -  5.11 MIL/uL   Hemoglobin 14.2  12.0 - 15.0 g/dL   HCT 16.1  09.6 - 04.5 %   MCV 76.7 (*) 78.0 - 100.0 fL   MCH 26.1  26.0 - 34.0 pg   MCHC 34.1  30.0 - 36.0 g/dL   RDW 40.9 (*) 81.1 - 91.4 %   Platelets PLATELET CLUMPS NOTED ON SMEAR, UNABLE TO ESTIMATE  150 - 400 K/uL   Neutrophils Relative % 87 (*) 43 - 77 %   Lymphocytes Relative 4 (*) 12 - 46 %   Monocytes Relative 5  3 - 12 %   Eosinophils Relative 4  0 - 5 %   Basophils Relative 0  0 - 1 %   Neutro Abs 8.6 (*) 1.7 - 7.7 K/uL   Lymphs Abs 0.4 (*) 0.7 -  4.0 K/uL   Monocytes Absolute 0.5  0.1 - 1.0 K/uL   Eosinophils Absolute 0.4  0.0 - 0.7 K/uL   Basophils Absolute 0.0  0.0 - 0.1 K/uL   WBC Morphology FEW NEUTROPHIL BANDS NOTED    COMPREHENSIVE METABOLIC PANEL     Status: Abnormal   Collection Time    06/02/12  2:40 PM      Result Value Range   Sodium 140  135 - 145 mEq/L   Potassium 5.2 (*) 3.5 - 5.1 mEq/L   Comment: HEMOLYSIS AT THIS LEVEL MAY AFFECT RESULT   Chloride 102  96 - 112 mEq/L   CO2 17 (*) 19 - 32 mEq/L   Glucose, Bld 166 (*) 70 - 99 mg/dL   BUN 26 (*) 6 - 23 mg/dL   Creatinine, Ser 7.82  0.50 - 1.10 mg/dL   Calcium 9.3  8.4 - 95.6 mg/dL   Total Protein 7.9  6.0 - 8.3 g/dL   Albumin 3.7  3.5 - 5.2 g/dL   AST 31  0 - 37 U/L   ALT 14  0 - 35 U/L   Alkaline Phosphatase 50  39 - 117 U/L   Total Bilirubin 0.4  0.3 - 1.2 mg/dL   GFR calc non Af Amer 80 (*) >90 mL/min   GFR calc Af Amer >90  >90 mL/min   Comment:            The eGFR has been calculated     using the CKD EPI equation.     This calculation has not been     validated in all clinical     situations.     eGFR's persistently     <90 mL/min signify     possible Chronic Kidney Disease.  LIPASE, BLOOD     Status: None   Collection Time    06/02/12  2:40 PM      Result Value Range   Lipase 22  11 - 59 U/L  URINALYSIS, ROUTINE W REFLEX MICROSCOPIC     Status: Abnormal   Collection Time    06/02/12  5:01 PM      Result Value Range   Color, Urine YELLOW  YELLOW   APPearance CLEAR  CLEAR   Specific Gravity, Urine 1.026  1.005 - 1.030   pH 5.0  5.0 - 8.0   Glucose, UA >1000 (*) NEGATIVE mg/dL   Hgb urine dipstick LARGE (*) NEGATIVE   Bilirubin Urine NEGATIVE  NEGATIVE   Ketones, ur NEGATIVE  NEGATIVE mg/dL   Protein, ur NEGATIVE  NEGATIVE mg/dL   Urobilinogen, UA 0.2  0.0 - 1.0 mg/dL   Nitrite NEGATIVE  NEGATIVE  Leukocytes, UA NEGATIVE  NEGATIVE  URINE MICROSCOPIC-ADD ON     Status: None   Collection Time    06/02/12  5:01 PM      Result Value  Range   Squamous Epithelial / LPF RARE  RARE   WBC, UA 0-2  <3 WBC/hpf   RBC / HPF 21-50  <3 RBC/hpf  POCT PREGNANCY, URINE     Status: None   Collection Time    06/02/12  5:14 PM      Result Value Range   Preg Test, Ur NEGATIVE  NEGATIVE   Comment:            THE SENSITIVITY OF THIS     METHODOLOGY IS >24 mIU/mL  LACTIC ACID, PLASMA     Status: Abnormal   Collection Time    06/02/12  5:24 PM      Result Value Range   Lactic Acid, Venous 4.5 (*) 0.5 - 2.2 mmol/L  BASIC METABOLIC PANEL     Status: Abnormal   Collection Time    06/02/12  5:34 PM      Result Value Range   Sodium 140  135 - 145 mEq/L   Potassium 4.1  3.5 - 5.1 mEq/L   Comment: DELTA CHECK NOTED   Chloride 102  96 - 112 mEq/L   CO2 21  19 - 32 mEq/L   Glucose, Bld 204 (*) 70 - 99 mg/dL   BUN 26 (*) 6 - 23 mg/dL   Creatinine, Ser 1.61  0.50 - 1.10 mg/dL   Calcium 8.8  8.4 - 09.6 mg/dL   GFR calc non Af Amer 63 (*) >90 mL/min   GFR calc Af Amer 73 (*) >90 mL/min   Comment:            The eGFR has been calculated     using the CKD EPI equation.     This calculation has not been     validated in all clinical     situations.     eGFR's persistently     <90 mL/min signify     possible Chronic Kidney Disease.  CG4 I-STAT (LACTIC ACID)     Status: Abnormal   Collection Time    06/02/12  5:53 PM      Result Value Range   Lactic Acid, Venous 4.62 (*) 0.5 - 2.2 mmol/L  LACTIC ACID, PLASMA     Status: Abnormal   Collection Time    06/02/12 10:55 PM      Result Value Range   Lactic Acid, Venous 2.5 (*) 0.5 - 2.2 mmol/L  HEPATIC FUNCTION PANEL     Status: Abnormal   Collection Time    06/03/12  2:20 AM      Result Value Range   Total Protein 6.4  6.0 - 8.3 g/dL   Albumin 2.9 (*) 3.5 - 5.2 g/dL   AST 12  0 - 37 U/L   ALT 8  0 - 35 U/L   Alkaline Phosphatase 41  39 - 117 U/L   Total Bilirubin 0.3  0.3 - 1.2 mg/dL   Bilirubin, Direct <0.4  0.0 - 0.3 mg/dL   Indirect Bilirubin NOT CALCULATED  0.3 - 0.9 mg/dL   CBC     Status: Abnormal   Collection Time    06/03/12  2:20 AM      Result Value Range   WBC 8.5  4.0 - 10.5 K/uL   RBC 4.24  3.87 - 5.11 MIL/uL  Hemoglobin 11.0 (*) 12.0 - 15.0 g/dL   Comment: DELTA CHECK NOTED     REPEATED TO VERIFY   HCT 32.9 (*) 36.0 - 46.0 %   MCV 77.6 (*) 78.0 - 100.0 fL   MCH 25.9 (*) 26.0 - 34.0 pg   MCHC 33.4  30.0 - 36.0 g/dL   RDW 08.6 (*) 57.8 - 46.9 %   Platelets 266  150 - 400 K/uL  PROTIME-INR     Status: None   Collection Time    06/03/12  2:20 AM      Result Value Range   Prothrombin Time 13.5  11.6 - 15.2 seconds   INR 1.04  0.00 - 1.49  TYPE AND SCREEN     Status: None   Collection Time    06/03/12  2:25 AM      Result Value Range   ABO/RH(D) O POS     Antibody Screen NEG     Sample Expiration 06/06/2012    ABO/RH     Status: None   Collection Time    06/03/12  2:25 AM      Result Value Range   ABO/RH(D) O POS    GLUCOSE, CAPILLARY     Status: Abnormal   Collection Time    06/03/12  3:48 AM      Result Value Range   Glucose-Capillary 153 (*) 70 - 99 mg/dL  MRSA PCR SCREENING     Status: None   Collection Time    06/03/12  4:24 AM      Result Value Range   MRSA by PCR NEGATIVE  NEGATIVE   Comment:            The GeneXpert MRSA Assay (FDA     approved for NASAL specimens     only), is one component of a     comprehensive MRSA colonization     surveillance program. It is not     intended to diagnose MRSA     infection nor to guide or     monitor treatment for     MRSA infections.  MAGNESIUM     Status: None   Collection Time    06/03/12  5:20 AM      Result Value Range   Magnesium 1.7  1.5 - 2.5 mg/dL  PHOSPHORUS     Status: None   Collection Time    06/03/12  5:20 AM      Result Value Range   Phosphorus 2.6  2.3 - 4.6 mg/dL  COMPREHENSIVE METABOLIC PANEL     Status: Abnormal   Collection Time    06/03/12  5:20 AM      Result Value Range   Sodium 144  135 - 145 mEq/L   Potassium 3.4 (*) 3.5 - 5.1 mEq/L    Comment: DELTA CHECK NOTED   Chloride 109  96 - 112 mEq/L   CO2 23  19 - 32 mEq/L   Glucose, Bld 163 (*) 70 - 99 mg/dL   BUN 17  6 - 23 mg/dL   Creatinine, Ser 6.29  0.50 - 1.10 mg/dL   Calcium 7.8 (*) 8.4 - 10.5 mg/dL   Total Protein 6.5  6.0 - 8.3 g/dL   Albumin 2.8 (*) 3.5 - 5.2 g/dL   AST 10  0 - 37 U/L   ALT 8  0 - 35 U/L   Alkaline Phosphatase 42  39 - 117 U/L   Total Bilirubin 0.3  0.3 - 1.2 mg/dL  GFR calc non Af Amer 74 (*) >90 mL/min   GFR calc Af Amer 86 (*) >90 mL/min   Comment:            The eGFR has been calculated     using the CKD EPI equation.     This calculation has not been     validated in all clinical     situations.     eGFR's persistently     <90 mL/min signify     possible Chronic Kidney Disease.  CBC     Status: Abnormal   Collection Time    06/03/12  5:20 AM      Result Value Range   WBC 7.9  4.0 - 10.5 K/uL   RBC 4.33  3.87 - 5.11 MIL/uL   Hemoglobin 11.1 (*) 12.0 - 15.0 g/dL   HCT 40.9 (*) 81.1 - 91.4 %   MCV 77.8 (*) 78.0 - 100.0 fL   MCH 25.6 (*) 26.0 - 34.0 pg   MCHC 32.9  30.0 - 36.0 g/dL   RDW 78.2 (*) 95.6 - 21.3 %   Platelets 270  150 - 400 K/uL  LACTIC ACID, PLASMA     Status: None   Collection Time    06/03/12  5:20 AM      Result Value Range   Lactic Acid, Venous 1.2  0.5 - 2.2 mmol/L    Imaging / Studies: Ct Abdomen Pelvis Wo Contrast  06/02/2012   *RADIOLOGY REPORT*  Clinical Data: Severe abdominal pain.  Nausea and vomiting. Previous hernia surgery.Suspect small bowel obstruction on abdominal radiographs.  The  CT ABDOMEN AND PELVIS WITHOUT CONTRAST  Technique:  Multidetector CT imaging of the abdomen and pelvis was performed following the standard protocol without intravenous contrast.  Comparison: 10/15/2011 and 04/04/2004  Findings: Nasogastric tube is seen within the stomach.  There is no evidence of dilated bowel loops or transition point.  Contrast material is seen throughout the colon.  Mild hepatic steatosis again  demonstrated.  Gallbladder is unremarkable.  The pancreas, spleen, and adrenal glands have a normal appearance on this noncontrast study as well as the gallbladder.  Bilateral renal parenchymal scarring is stable.  No evidence of renal calculi or hydronephrosis.  No evidence of ureteral calculi or dilatation.  Small uterine fibroids again noted. No evidence of inflammatory process or abnormal fluid collections.  Surgical mesh is seen in the lower anterior abdominal wall soft tissues.  A right lower quadrant ventral abdominal wall hernia is seen containing several loops of small bowel.  No evidence of bowel obstruction or ischemia.  IMPRESSION:  1.  No evidence of small bowel obstruction or other acute findings. 2.  Small right lower quadrant ventral abdominal wall hernia containing small bowel. 3.  Stable small uterine fibroids. 4.  Stable mild hepatic steatosis and bilateral renal scarring.   Original Report Authenticated By: Myles Rosenthal, M.D.   Dg Abd Acute W/chest  06/02/2012   *RADIOLOGY REPORT*  Clinical Data: Right lower quadrant pain.  Nausea vomiting. Diarrhea.  ACUTE ABDOMEN SERIES (ABDOMEN 2 VIEW & CHEST 1 VIEW)  Comparison: 08/01/2011  Findings: Mildly dilated small bowel loops containing air fluid levels.  There is a paucity of colonic gas.  This is suspicious for a partial or low grade small bowel obstruction.  There is no evidence of free air.  Surgical mesh seen in the lower abdominal wall.  Heart size is normal.  Both lungs are clear.  No evidence of pleural effusion.  IMPRESSION:  1.  Findings consistent with partial or low grade small bowel obstruction. 2.  No active cardiopulmonary disease.   Original Report Authenticated By: Myles Rosenthal, M.D.    Medications / Allergies: per chart  Antibiotics: Anti-infectives   Start     Dose/Rate Route Frequency Ordered Stop   06/02/12 1645  cefTRIAXone (ROCEPHIN) 1 g in dextrose 5 % 50 mL IVPB     1 g 100 mL/hr over 30 Minutes Intravenous  Once  06/02/12 1640 06/02/12 1741

## 2012-06-03 NOTE — Op Note (Signed)
[facility] [address] [place], [address]   OPERATIVE PROCEDURE REPORT  PATIENT :Tina, [name]  MR#: [id number] BIRTHDATE :1970/09/09 GENDER: Female ENDOSCOPIST: Dr.  Lorenza Burton, MD ASSISTANT:   Olene Craven, technician Anthony Sar, RN PROCEDURE DATE: 06/12/12 PRE-PROCEDURE PREPERATION: Patient fasted for 8 hours prior to the procedure. PRE-PROCEDURE PHYSICAL: Patient has stable vital signs.  Neck is supple.  There is no JVD, thyromegaly or LAD.  Chest clear to auscultation.  S1 and S2 regular.  Abdomen soft, morbidly obese with RLQ tenderness on palpation with NABS. PROCEDURE:     EGD, diagnostic ASA CLASS:     Class IV INDICATIONS:     Hematemesis and nausea with vomiting.Marland Kitchen MEDICATIONS:     Fentanyl 100 mcg IV TOPICAL ANESTHETIC:   none given.  DESCRIPTION OF PROCEDURE: After the risks benefits and alternatives of the procedure were thoroughly explained, informed consent was obtained.  The Pentax Gastroscope [id number]  was introduced through the mouth and advanced to the second portion of the duodenum , without limitations. The instrument was slowly withdrawn as the mucosa was fully examined.   The esophagus, GEJ and the proximal small bowel appeared normal. There were no ulcers, masses or polyps noted. Retroflexed views revealed no evidence of a hiatal hernia. An erythematous fold was noted-I strongly suspect all these mucosal cahnges are from NGT trauma. The scope was then withdrawn from the patient and the procedure terminated. The patient tolerated the procedure without immediate complications.  IMPRESSION: Patchy erythema in the stomach consistent with NGT trauma.  RECOMMENDATIONS: Continue present care. s   REPEAT EXAM:  No recall for now. Marland Kitchen DISCHARGE INSTRUCTIONS: Standard instructions given.  _______________________________ eSigned:  Dr. Lorenza Burton, MD Jun 12, 2012 8:04 AM   CPT CODES:     43235,  EGD  DIAGNOSIS CODES:     578.0 Hematemesis 787.01 Nausea and Vomiting    CC:  PATIENT NAME:  Tina, [name] MR#: [id number]

## 2012-06-03 NOTE — Consult Note (Signed)
Reason for Consult:abdominal pain Referring Physician: Dr. Sharon Seller  Tina [name] is an 42 y.o. female.  HPI: she presents with at least a several day history of abdominal pain, fever, nausea, emesis, and diarrhea.  She has multiple chronic med problems including chronic abdominal pain.  She was also having some flank pain and dysuria.  Now pain is in the lower quads bilaterally right greater than left.  She had a lap ventral incisional hernia repair by Dr. Michaell Cowing in September of last year.   Past Medical History  Diagnosis Date  . Flaccid hemiplegia affecting dominant side   . Enthesopathy of hip region     RIGHT HIP PAIN WITH PAIN DOWN RIGHT LEG  . Lumbago     LOWER BACK  . Spastic hemiplegia affecting dominant side   . Panic disorder without agoraphobia   . Depression   . Anxiety   . Diabetes mellitus   . Hypertension   . Blood transfusion     IN 2012  AFTER C -SECTION  . Cerebral thrombosis with cerebral infarction JUNE 2011    RIGHT SIDED WEAKNESS ( ARM AND LEG ) AND SPASMS  . Stroke   . Right rotator cuff tear     PAIN IN RIGHT SHOULDER  . Ventral hernia     RIGHT LOWER QUADRANT-CAUSING SOME PAIN  . Cough     STARTED 09/24/11--NO OTHER SYMPTOMS.  Marland Kitchen Anemia     DURING MENSES--HAS HEAVY BLEEDING WITH PERODS  . Headache     MIGRAINES--NOT REALLY HEADACHE-MORE LIKE PRESSURE SENSATION IN HEAD-FEELS DIZZIY AND  FAINT AS THE PRESSURE RESOLVES  . Diabetic neuropathy     BOTH FEET --COMES AND GOES  . Restless leg syndrome     DIAGNOSED BY SLEEP STUDY - PT TOLD SHE DID NOT HAVE SLEEP APNEA  . Rash     HANDS, ARMS --STATES HX OF RASH EVER SINCE CHILDBIRTH/PREGNANCY.  STATES THE RASH OFTEN OCCURS WHEN SHE IS REALLY STRESSED.  Marland Kitchen Hernia, incisional, RLQ, s/p lap repair Sep 2013 09/02/2011    Past Surgical History  Procedure Laterality Date  . Cesarean section  2012  . Uterine fibroid surgery      2 SURGERIES FOR FIBROIDS  . Ureter revision    . Ventral hernia repair   10/03/2011    Procedure: LAPAROSCOPIC VENTRAL HERNIA;  Surgeon: Ardeth Sportsman, MD;  Location: [facility];  Service: General;  Laterality: N/A;  . Hernia repair  10/03/11    ventral hernia repair    Family History  Problem Relation Age of Onset  . Diabetes Father   . Kidney disease Father   . Other Neg Hx   . Diabetes Maternal Grandmother   . Hyperlipidemia Paternal Grandmother   . Stroke Paternal Grandmother     Social History:  reports that she has never smoked. She has never used smokeless tobacco. She reports that she does not drink alcohol or use illicit drugs.  Allergies:  Allergies  Allergen Reactions  . Contrast Media (Iodinated Diagnostic Agents) Other (See Comments)    Difficulty breathing  . Iohexol Hives, Nausea And Vomiting and Swelling     Desc: Magnevist-gadolinium-difficulty breathing, throat swelling   . Midazolam Hcl Anaphylaxis    Difficulty breathing  . Shellfish Allergy Anaphylaxis  . Avandia (Rosiglitazone Maleate)     Patient doesn't remember reaction  . Geodon (Ziprasidone)   . Kiwi Extract Itching and Swelling  . Metformin And Related Nausea And Vomiting and Other (See Comments)  diarrhea  . Other Itching    Patient is allergic to all nuts except peanuts.     Medications: I have reviewed the patient's current medications.  Results for orders placed during the hospital encounter of 06/02/12 (from the past 48 hour(s))  CBC WITH DIFFERENTIAL     Status: Abnormal   Collection Time    06/02/12  2:40 PM      Result Value Range   WBC 9.9  4.0 - 10.5 K/uL   RBC 5.44 (*) 3.87 - 5.11 MIL/uL   Hemoglobin 14.2  12.0 - 15.0 g/dL   HCT 16.1  09.6 - 04.5 %   MCV 76.7 (*) 78.0 - 100.0 fL   MCH 26.1  26.0 - 34.0 pg   MCHC 34.1  30.0 - 36.0 g/dL   RDW 40.9 (*) 81.1 - 91.4 %   Platelets PLATELET CLUMPS NOTED ON SMEAR, UNABLE TO ESTIMATE  150 - 400 K/uL   Neutrophils Relative % 87 (*) 43 - 77 %   Lymphocytes Relative 4 (*) 12 - 46 %   Monocytes Relative 5  3  - 12 %   Eosinophils Relative 4  0 - 5 %   Basophils Relative 0  0 - 1 %   Neutro Abs 8.6 (*) 1.7 - 7.7 K/uL   Lymphs Abs 0.4 (*) 0.7 - 4.0 K/uL   Monocytes Absolute 0.5  0.1 - 1.0 K/uL   Eosinophils Absolute 0.4  0.0 - 0.7 K/uL   Basophils Absolute 0.0  0.0 - 0.1 K/uL   WBC Morphology FEW NEUTROPHIL BANDS NOTED    COMPREHENSIVE METABOLIC PANEL     Status: Abnormal   Collection Time    06/02/12  2:40 PM      Result Value Range   Sodium 140  135 - 145 mEq/L   Potassium 5.2 (*) 3.5 - 5.1 mEq/L   Comment: HEMOLYSIS AT THIS LEVEL MAY AFFECT RESULT   Chloride 102  96 - 112 mEq/L   CO2 17 (*) 19 - 32 mEq/L   Glucose, Bld 166 (*) 70 - 99 mg/dL   BUN 26 (*) 6 - 23 mg/dL   Creatinine, Ser 7.82  0.50 - 1.10 mg/dL   Calcium 9.3  8.4 - 95.6 mg/dL   Total Protein 7.9  6.0 - 8.3 g/dL   Albumin 3.7  3.5 - 5.2 g/dL   AST 31  0 - 37 U/L   ALT 14  0 - 35 U/L   Alkaline Phosphatase 50  39 - 117 U/L   Total Bilirubin 0.4  0.3 - 1.2 mg/dL   GFR calc non Af Amer 80 (*) >90 mL/min   GFR calc Af Amer >90  >90 mL/min   Comment:            The eGFR has been calculated     using the CKD EPI equation.     This calculation has not been     validated in all clinical     situations.     eGFR's persistently     <90 mL/min signify     possible Chronic Kidney Disease.  LIPASE, BLOOD     Status: None   Collection Time    06/02/12  2:40 PM      Result Value Range   Lipase 22  11 - 59 U/L  URINALYSIS, ROUTINE W REFLEX MICROSCOPIC     Status: Abnormal   Collection Time    06/02/12  5:01 PM  Result Value Range   Color, Urine YELLOW  YELLOW   APPearance CLEAR  CLEAR   Specific Gravity, Urine 1.026  1.005 - 1.030   pH 5.0  5.0 - 8.0   Glucose, UA >1000 (*) NEGATIVE mg/dL   Hgb urine dipstick LARGE (*) NEGATIVE   Bilirubin Urine NEGATIVE  NEGATIVE   Ketones, ur NEGATIVE  NEGATIVE mg/dL   Protein, ur NEGATIVE  NEGATIVE mg/dL   Urobilinogen, UA 0.2  0.0 - 1.0 mg/dL   Nitrite NEGATIVE  NEGATIVE    Leukocytes, UA NEGATIVE  NEGATIVE  URINE MICROSCOPIC-ADD ON     Status: None   Collection Time    06/02/12  5:01 PM      Result Value Range   Squamous Epithelial / LPF RARE  RARE   WBC, UA 0-2  <3 WBC/hpf   RBC / HPF 21-50  <3 RBC/hpf  POCT PREGNANCY, URINE     Status: None   Collection Time    06/02/12  5:14 PM      Result Value Range   Preg Test, Ur NEGATIVE  NEGATIVE   Comment:            THE SENSITIVITY OF THIS     METHODOLOGY IS >24 mIU/mL  LACTIC ACID, PLASMA     Status: Abnormal   Collection Time    06/02/12  5:24 PM      Result Value Range   Lactic Acid, Venous 4.5 (*) 0.5 - 2.2 mmol/L  BASIC METABOLIC PANEL     Status: Abnormal   Collection Time    06/02/12  5:34 PM      Result Value Range   Sodium 140  135 - 145 mEq/L   Potassium 4.1  3.5 - 5.1 mEq/L   Comment: DELTA CHECK NOTED   Chloride 102  96 - 112 mEq/L   CO2 21  19 - 32 mEq/L   Glucose, Bld 204 (*) 70 - 99 mg/dL   BUN 26 (*) 6 - 23 mg/dL   Creatinine, Ser 0.98  0.50 - 1.10 mg/dL   Calcium 8.8  8.4 - 11.9 mg/dL   GFR calc non Af Amer 63 (*) >90 mL/min   GFR calc Af Amer 73 (*) >90 mL/min   Comment:            The eGFR has been calculated     using the CKD EPI equation.     This calculation has not been     validated in all clinical     situations.     eGFR's persistently     <90 mL/min signify     possible Chronic Kidney Disease.  CG4 I-STAT (LACTIC ACID)     Status: Abnormal   Collection Time    06/02/12  5:53 PM      Result Value Range   Lactic Acid, Venous 4.62 (*) 0.5 - 2.2 mmol/L  LACTIC ACID, PLASMA     Status: Abnormal   Collection Time    06/02/12 10:55 PM      Result Value Range   Lactic Acid, Venous 2.5 (*) 0.5 - 2.2 mmol/L  HEPATIC FUNCTION PANEL     Status: Abnormal   Collection Time    06/03/12  2:20 AM      Result Value Range   Total Protein 6.4  6.0 - 8.3 g/dL   Albumin 2.9 (*) 3.5 - 5.2 g/dL   AST 12  0 - 37 U/L   ALT 8  0 -  35 U/L   Alkaline Phosphatase 41  39 - 117 U/L    Total Bilirubin 0.3  0.3 - 1.2 mg/dL   Bilirubin, Direct <7.8  0.0 - 0.3 mg/dL   Indirect Bilirubin NOT CALCULATED  0.3 - 0.9 mg/dL  CBC     Status: Abnormal   Collection Time    06/03/12  2:20 AM      Result Value Range   WBC 8.5  4.0 - 10.5 K/uL   RBC 4.24  3.87 - 5.11 MIL/uL   Hemoglobin 11.0 (*) 12.0 - 15.0 g/dL   Comment: DELTA CHECK NOTED     REPEATED TO VERIFY   HCT 32.9 (*) 36.0 - 46.0 %   MCV 77.6 (*) 78.0 - 100.0 fL   MCH 25.9 (*) 26.0 - 34.0 pg   MCHC 33.4  30.0 - 36.0 g/dL   RDW 29.5 (*) 62.1 - 30.8 %   Platelets 266  150 - 400 K/uL  PROTIME-INR     Status: None   Collection Time    06/03/12  2:20 AM      Result Value Range   Prothrombin Time 13.5  11.6 - 15.2 seconds   INR 1.04  0.00 - 1.49  TYPE AND SCREEN     Status: None   Collection Time    06/03/12  2:25 AM      Result Value Range   ABO/RH(D) O POS     Antibody Screen NEG     Sample Expiration 06/06/2012    ABO/RH     Status: None   Collection Time    06/03/12  2:25 AM      Result Value Range   ABO/RH(D) O POS    GLUCOSE, CAPILLARY     Status: Abnormal   Collection Time    06/03/12  3:48 AM      Result Value Range   Glucose-Capillary 153 (*) 70 - 99 mg/dL  MRSA PCR SCREENING     Status: None   Collection Time    06/03/12  4:24 AM      Result Value Range   MRSA by PCR NEGATIVE  NEGATIVE   Comment:            The GeneXpert MRSA Assay (FDA     approved for NASAL specimens     only), is one component of a     comprehensive MRSA colonization     surveillance program. It is not     intended to diagnose MRSA     infection nor to guide or     monitor treatment for     MRSA infections.  MAGNESIUM     Status: None   Collection Time    06/03/12  5:20 AM      Result Value Range   Magnesium 1.7  1.5 - 2.5 mg/dL  PHOSPHORUS     Status: None   Collection Time    06/03/12  5:20 AM      Result Value Range   Phosphorus 2.6  2.3 - 4.6 mg/dL  COMPREHENSIVE METABOLIC PANEL     Status: Abnormal    Collection Time    06/03/12  5:20 AM      Result Value Range   Sodium 144  135 - 145 mEq/L   Potassium 3.4 (*) 3.5 - 5.1 mEq/L   Comment: DELTA CHECK NOTED   Chloride 109  96 - 112 mEq/L   CO2 23  19 - 32 mEq/L   Glucose, Bld 163 (*)  70 - 99 mg/dL   BUN 17  6 - 23 mg/dL   Creatinine, Ser 1.61  0.50 - 1.10 mg/dL   Calcium 7.8 (*) 8.4 - 10.5 mg/dL   Total Protein 6.5  6.0 - 8.3 g/dL   Albumin 2.8 (*) 3.5 - 5.2 g/dL   AST 10  0 - 37 U/L   ALT 8  0 - 35 U/L   Alkaline Phosphatase 42  39 - 117 U/L   Total Bilirubin 0.3  0.3 - 1.2 mg/dL   GFR calc non Af Amer 74 (*) >90 mL/min   GFR calc Af Amer 86 (*) >90 mL/min   Comment:            The eGFR has been calculated     using the CKD EPI equation.     This calculation has not been     validated in all clinical     situations.     eGFR's persistently     <90 mL/min signify     possible Chronic Kidney Disease.  CBC     Status: Abnormal   Collection Time    06/03/12  5:20 AM      Result Value Range   WBC 7.9  4.0 - 10.5 K/uL   RBC 4.33  3.87 - 5.11 MIL/uL   Hemoglobin 11.1 (*) 12.0 - 15.0 g/dL   HCT 09.6 (*) 04.5 - 40.9 %   MCV 77.8 (*) 78.0 - 100.0 fL   MCH 25.6 (*) 26.0 - 34.0 pg   MCHC 32.9  30.0 - 36.0 g/dL   RDW 81.1 (*) 91.4 - 78.2 %   Platelets 270  150 - 400 K/uL    Ct Abdomen Pelvis Wo Contrast  06/02/2012   *RADIOLOGY REPORT*  Clinical Data: Severe abdominal pain.  Nausea and vomiting. Previous hernia surgery.Suspect small bowel obstruction on abdominal radiographs.  The  CT ABDOMEN AND PELVIS WITHOUT CONTRAST  Technique:  Multidetector CT imaging of the abdomen and pelvis was performed following the standard protocol without intravenous contrast.  Comparison: 10/15/2011 and 04/04/2004  Findings: Nasogastric tube is seen within the stomach.  There is no evidence of dilated bowel loops or transition point.  Contrast material is seen throughout the colon.  Mild hepatic steatosis again demonstrated.  Gallbladder is  unremarkable.  The pancreas, spleen, and adrenal glands have a normal appearance on this noncontrast study as well as the gallbladder.  Bilateral renal parenchymal scarring is stable.  No evidence of renal calculi or hydronephrosis.  No evidence of ureteral calculi or dilatation.  Small uterine fibroids again noted. No evidence of inflammatory process or abnormal fluid collections.  Surgical mesh is seen in the lower anterior abdominal wall soft tissues.  A right lower quadrant ventral abdominal wall hernia is seen containing several loops of small bowel.  No evidence of bowel obstruction or ischemia.  IMPRESSION:  1.  No evidence of small bowel obstruction or other acute findings. 2.  Small right lower quadrant ventral abdominal wall hernia containing small bowel. 3.  Stable small uterine fibroids. 4.  Stable mild hepatic steatosis and bilateral renal scarring.   Original Report Authenticated By: Myles Rosenthal, M.D.   Dg Abd Acute W/chest  06/02/2012   *RADIOLOGY REPORT*  Clinical Data: Right lower quadrant pain.  Nausea vomiting. Diarrhea.  ACUTE ABDOMEN SERIES (ABDOMEN 2 VIEW & CHEST 1 VIEW)  Comparison: 08/01/2011  Findings: Mildly dilated small bowel loops containing air fluid levels.  There is a paucity of colonic  gas.  This is suspicious for a partial or low grade small bowel obstruction.  There is no evidence of free air.  Surgical mesh seen in the lower abdominal wall.  Heart size is normal.  Both lungs are clear.  No evidence of pleural effusion.  IMPRESSION:  1.  Findings consistent with partial or low grade small bowel obstruction. 2.  No active cardiopulmonary disease.   Original Report Authenticated By: Myles Rosenthal, M.D.    Review of Systems  Unable to perform ROS: acuity of condition   Blood pressure 131/63, pulse 98, temperature 99.1 F (37.3 C), temperature source Oral, resp. rate 30, height 5\' 6"  (1.676 m), weight 235 lb 14.3 oz (107 kg), last menstrual period 06/02/2012, SpO2  98.00%. Physical Exam  Constitutional: No distress.  Morbidly obese  HENT:  Head: Normocephalic and atraumatic.  Right Ear: External ear normal.  Left Ear: External ear normal.  Eyes: Conjunctivae are normal. Pupils are equal, round, and reactive to light. No scleral icterus.  Cardiovascular: Regular rhythm and normal heart sounds.   tachycardic  Respiratory: Breath sounds normal.  Increased resp rate  GI: Soft. There is tenderness. There is guarding.  Abdomen is obese with mild tenderness and guarding in the RLQ.  No frank peritonitis.  Neurological:  Sedated.  Right sided paralysis  Skin: Skin is warm and dry.    Assessment/Plan: Abdominal pain uncertain etiology  I have reviewed the CT scan as well as the operative note from the patient's previous surgery.  There is no evidence of bowel obstruction or intra-abdominal inflammatory process.  Given the op note, I do not believe there is a small hernia but an eventration of the previously placed mesh. This may represent an infectious process.  Her lactate level is improving.  We will follow along.  Aleesa Sweigert A 06/03/2012, 6:36 AM

## 2012-06-03 NOTE — H&P (Signed)
PCP:  Michiel Sites, MD    Chief Complaint:  Abdominal pain  HPI: Tina [name] is a 42 y.o. female   has a past medical history of Flaccid hemiplegia affecting dominant side; Enthesopathy of hip region; Lumbago; Spastic hemiplegia affecting dominant side; Panic disorder without agoraphobia; Depression; Anxiety; Diabetes mellitus; Hypertension; Blood transfusion; Cerebral thrombosis with cerebral infarction [facility] 2011); Stroke; Right rotator cuff tear; Ventral hernia; Cough; Anemia; Headache; Diabetic neuropathy; Restless leg syndrome; Rash; and Hernia, incisional, RLQ, s/p lap repair Sep 2013 (09/02/2011).   Presented with   reports 1 week hx of RLQ abdominal abdominal pain and nausea with vomiting starting yesterday morning. She have had severe diarrhea all day. She presented to ER and was found to have SBO on plain films with elevated lactate. She had NG tube placed and then CT scan without contrast was done that showed no evidence of SBO or any acute changes. She has ventral hernia containing small bowel without evidence of incarceration. She states she is feeling somewhat better. Initially she was febrile up to 101.6. No recent antibiotic exposure except she was give ceftriaxone in ED for treatment of presumed pyelonephritis before her urine was found to be clean.  When I came to evaluate the patient I noted large amount of bloody secretions coming from NG tube. Patient is on chronic aspirin for hx of CVA resulting in chronic right hemiparesis. Spoke to Dr. Loreta Ave who will see patient in AM. ER have spoken to surgery who will see patient in AM as well but per radiology no evidence of bowel incarceration.    Review of Systems:    Pertinent positives include:  Fevers,  abdominal pain, nausea, vomiting, diarrhea,   Constitutional:  No weight loss, night sweats, chills, fatigue, weight loss  HEENT:  No headaches, Difficulty swallowing,Tooth/dental problems,Sore throat,  No sneezing,  itching, ear ache, nasal congestion, post nasal drip,  Cardio-vascular:  No chest pain, Orthopnea, PND, anasarca, dizziness, palpitations.no Bilateral lower extremity swelling  GI:  No heartburn, indigestion,change in bowel habits, loss of appetite, melena, blood in stool, hematemesis Resp:  no shortness of breath at rest. No dyspnea on exertion, No excess mucus, no productive cough, No non-productive cough, No coughing up of blood.No change in color of mucus. No wheezing. Skin:  no rash or lesions. No jaundice GU:  no dysuria, change in color of urine, no urgency or frequency. No straining to urinate.  No flank pain.  Musculoskeletal:  No joint pain or no joint swelling. No decreased range of motion. No back pain.  Psych:  No change in mood or affect. No depression or anxiety. No memory loss.  Neuro: no localizing neurological complaints, no tingling, no weakness, no double vision, no gait abnormality, no slurred speech, no confusion  Otherwise ROS are negative except for above, 10 systems were reviewed  Past Medical History: Past Medical History  Diagnosis Date  . Flaccid hemiplegia affecting dominant side   . Enthesopathy of hip region     RIGHT HIP PAIN WITH PAIN DOWN RIGHT LEG  . Lumbago     LOWER BACK  . Spastic hemiplegia affecting dominant side   . Panic disorder without agoraphobia   . Depression   . Anxiety   . Diabetes mellitus   . Hypertension   . Blood transfusion     IN 2012  AFTER C -SECTION  . Cerebral thrombosis with cerebral infarction JUNE 2011    RIGHT SIDED WEAKNESS ( ARM AND LEG ) AND SPASMS  .  Stroke   . Right rotator cuff tear     PAIN IN RIGHT SHOULDER  . Ventral hernia     RIGHT LOWER QUADRANT-CAUSING SOME PAIN  . Cough     STARTED 09/24/11--NO OTHER SYMPTOMS.  Marland Kitchen Anemia     DURING MENSES--HAS HEAVY BLEEDING WITH PERODS  . Headache     MIGRAINES--NOT REALLY HEADACHE-MORE LIKE PRESSURE SENSATION IN HEAD-FEELS DIZZIY AND  FAINT AS THE PRESSURE  RESOLVES  . Diabetic neuropathy     BOTH FEET --COMES AND GOES  . Restless leg syndrome     DIAGNOSED BY SLEEP STUDY - PT TOLD SHE DID NOT HAVE SLEEP APNEA  . Rash     HANDS, ARMS --STATES HX OF RASH EVER SINCE CHILDBIRTH/PREGNANCY.  STATES THE RASH OFTEN OCCURS WHEN SHE IS REALLY STRESSED.  Marland Kitchen Hernia, incisional, RLQ, s/p lap repair Sep 2013 09/02/2011   Past Surgical History  Procedure Laterality Date  . Cesarean section  2012  . Uterine fibroid surgery      2 SURGERIES FOR FIBROIDS  . Ureter revision    . Ventral hernia repair  10/03/2011    Procedure: LAPAROSCOPIC VENTRAL HERNIA;  Surgeon: Ardeth Sportsman, MD;  Location: [facility];  Service: General;  Laterality: N/A;  . Hernia repair  10/03/11    ventral hernia repair     Medications: Prior to Admission medications   Medication Sig Start Date End Date Taking? Authorizing Provider  APIDRA 100 UNIT/ML injection 1-12 Units by Subconjunctival route 3 (three) times daily before meals.  02/02/12  Yes Historical Provider, MD  aspirin 325 MG tablet Take 325 mg by mouth daily.    Yes Historical Provider, MD  B-D INS SYR ULTRAFINE .3CC/30G 30G X 1/2" 0.3 ML MISC  10/29/11  Yes Historical Provider, MD  B-D ULTRAFINE III SHORT PEN 31G X 8 MM MISC  09/24/11  Yes Historical Provider, MD  BYSTOLIC 10 MG tablet Take 10 mg by mouth daily.  01/05/12  Yes Historical Provider, MD  diclofenac sodium (VOLTAREN) 1 % GEL Apply 2 g topically 4 (four) times daily. 02/16/12  Yes Karen Prueter, PA-C  EXFORGE HCT 10-320-25 MG TABS Take 1 tablet by mouth every evening.  05/26/11  Yes Historical Provider, MD  furosemide (LASIX) 40 MG tablet Take 20-40 mg by mouth daily.   Yes Historical Provider, MD  gabapentin (NEURONTIN) 300 MG capsule Take 300 mg by mouth 3 (three) times daily.   Yes Historical Provider, MD  insulin glargine (LANTUS) 100 UNIT/ML injection Inject 20-22 Units into the skin 2 (two) times daily. Inject 22 units in the morning and 20 units in the evening    Yes Historical Provider, MD  insulin lispro (HUMALOG) 100 UNIT/ML injection Inject 6-10 Units into the skin 3 (three) times daily before meals. Pt on sliding scale   Yes Historical Provider, MD  INVOKANA 300 MG TABS Take 300 mg by mouth daily.  02/29/12  Yes Historical Provider, MD  Multiple Vitamin (MULTIVITAMIN WITH MINERALS) TABS Take 1 tablet by mouth daily.   Yes Historical Provider, MD  ONE TOUCH ULTRA TEST test strip  12/01/11  Yes Historical Provider, MD  promethazine (PHENERGAN) 12.5 MG tablet Take 12.5 mg by mouth every 6 (six) hours as needed. For nausea   Yes Historical Provider, MD  tiZANidine (ZANAFLEX) 4 MG capsule Take 4 mg by mouth 2 (two) times daily.  07/11/11  Yes Erick Colace, MD  traMADol (ULTRAM) 50 MG tablet Take 50 mg by mouth 3 (three)  times daily.   Yes Historical Provider, MD    Allergies:   Allergies  Allergen Reactions  . Contrast Media (Iodinated Diagnostic Agents) Other (See Comments)    Difficulty breathing  . Iohexol Hives, Nausea And Vomiting and Swelling     Desc: Magnevist-gadolinium-difficulty breathing, throat swelling   . Midazolam Hcl Anaphylaxis    Difficulty breathing  . Shellfish Allergy Anaphylaxis  . Avandia (Rosiglitazone Maleate)     Patient doesn't remember reaction  . Geodon (Ziprasidone)   . Kiwi Extract Itching and Swelling  . Metformin And Related Nausea And Vomiting and Other (See Comments)    diarrhea  . Other Itching    Patient is allergic to all nuts except peanuts.     Social History:  Ambulatory with cane Lives at  Home    reports that she has never smoked. She has never used smokeless tobacco. She reports that she does not drink alcohol or use illicit drugs.   Family History: family history includes Diabetes in her father and maternal grandmother; Hyperlipidemia in her paternal grandmother; Kidney disease in her father; and Stroke in her paternal grandmother.  There is no history of Other.    Physical  Exam: Patient Vitals for the past 24 hrs:  BP Temp Temp src Pulse Resp SpO2  06/03/12 0157 126/63 mmHg - - 103 - -  06/03/12 0038 134/61 mmHg 99.9 F (37.7 C) Oral 104 22 100 %  06/02/12 1915 118/53 mmHg - - 111 29 -  06/02/12 1900 113/50 mmHg - - 110 34 -  06/02/12 1845 126/65 mmHg - - 108 38 -  06/02/12 1830 122/66 mmHg - - 111 37 -  06/02/12 1815 130/71 mmHg - - 109 35 -  06/02/12 1800 130/69 mmHg - - 108 36 -  06/02/12 1745 144/75 mmHg - - 116 25 -  06/02/12 1730 - - - 113 27 -  06/02/12 1715 141/77 mmHg - - 117 18 -  06/02/12 1700 134/70 mmHg - - 116 25 -  06/02/12 1645 154/79 mmHg - - 113 29 -  06/02/12 1556 136/84 mmHg 101.6 F (38.7 C) Oral 116 22 99 %  06/02/12 1345 130/71 mmHg 98.8 F (37.1 C) Oral 120 18 100 %    1. General:  in No Acute distress 2. Psychological: Alert and   Oriented 3. Head/ENT:    Dry Mucous Membranes                          Head Non traumatic, neck supple                          Normal Dentition 4. SKIN:   decreased Skin turgor,  Skin clean Dry and intact no rash 5. Heart: Regular rate and rhythm no Murmur, Rub or gallop 6. Lungs: Clear to auscultation bilaterally, no wheezes or crackles   7. Abdomen: Soft, non-tender, Non distended 8. Lower extremities: no clubbing, cyanosis, or edema 9. Neurologically slight decreased in strength on the right chronic; 10. MSK: Normal range of motion  body mass index is unknown because there is no weight on file.   Labs on Admission:   Recent Labs  06/02/12 1440 06/02/12 1734  NA 140 140  K 5.2* 4.1  CL 102 102  CO2 17* 21  GLUCOSE 166* 204*  BUN 26* 26*  CREATININE 0.88 1.07  CALCIUM 9.3 8.8    Recent Labs  06/02/12 1440  AST 31  ALT 14  ALKPHOS 50  BILITOT 0.4  PROT 7.9  ALBUMIN 3.7    Recent Labs  06/02/12 1440  LIPASE 22    Recent Labs  06/02/12 1440  WBC 9.9  NEUTROABS 8.6*  HGB 14.2  HCT 41.7  MCV 76.7*  PLT PLATELET CLUMPS NOTED ON SMEAR, UNABLE TO ESTIMATE    No results found for this basename: CKTOTAL, CKMB, CKMBINDEX, TROPONINI,  in the last 72 hours No results found for this basename: TSH, T4TOTAL, FREET3, T3FREE, THYROIDAB,  in the last 72 hours No results found for this basename: VITAMINB12, FOLATE, FERRITIN, TIBC, IRON, RETICCTPCT,  in the last 72 hours Lab Results  Component Value Date   HGBA1C 8.3* 10/17/2011    The CrCl is unknown because both a height and weight (above a minimum accepted value) are required for this calculation. ABG    Component Value Date/Time   PHART 7.458* 10/16/2011 2142   HCO3 22.5 10/16/2011 2142   TCO2 20.7 10/16/2011 2142   ACIDBASEDEF 0.5 10/16/2011 2142   O2SAT 97.4 10/16/2011 2142     Lab Results  Component Value Date   DDIMER  Value: 0.27        AT THE INHOUSE ESTABLISHED CUTOFF VALUE OF 0.48 ug/mL FEU, THIS ASSAY HAS BEEN DOCUMENTED IN THE LITERATURE TO HAVE A SENSITIVITY AND NEGATIVE PREDICTIVE VALUE OF AT LEAST 98 TO 99%.  THE TEST RESULT SHOULD BE CORRELATED WITH AN ASSESSMENT OF THE CLINICAL PROBABILITY OF DVT / VTE. 04/29/2008     Other results:  I have pearsonaly reviewed this: ECG REPORT  Rate: 99  Rhythm: NSR ST&T Change: no ischemic changes  UA with some hemoglobin   Cultures:    Component Value Date/Time   SDES URINE, RANDOM 10/17/2011 0400   SPECREQUEST NONE 10/17/2011 0400   CULT INSIGNIFICANT GROWTH 10/17/2011 0400   REPTSTATUS 10/18/2011 FINAL 10/17/2011 0400       Radiological Exams on Admission: Ct Abdomen Pelvis Wo Contrast  06/02/2012   *RADIOLOGY REPORT*  Clinical Data: Severe abdominal pain.  Nausea and vomiting. Previous hernia surgery.Suspect small bowel obstruction on abdominal radiographs.  The  CT ABDOMEN AND PELVIS WITHOUT CONTRAST  Technique:  Multidetector CT imaging of the abdomen and pelvis was performed following the standard protocol without intravenous contrast.  Comparison: 10/15/2011 and 04/04/2004  Findings: Nasogastric tube is seen within the stomach.   There is no evidence of dilated bowel loops or transition point.  Contrast material is seen throughout the colon.  Mild hepatic steatosis again demonstrated.  Gallbladder is unremarkable.  The pancreas, spleen, and adrenal glands have a normal appearance on this noncontrast study as well as the gallbladder.  Bilateral renal parenchymal scarring is stable.  No evidence of renal calculi or hydronephrosis.  No evidence of ureteral calculi or dilatation.  Small uterine fibroids again noted. No evidence of inflammatory process or abnormal fluid collections.  Surgical mesh is seen in the lower anterior abdominal wall soft tissues.  A right lower quadrant ventral abdominal wall hernia is seen containing several loops of small bowel.  No evidence of bowel obstruction or ischemia.  IMPRESSION:  1.  No evidence of small bowel obstruction or other acute findings. 2.  Small right lower quadrant ventral abdominal wall hernia containing small bowel. 3.  Stable small uterine fibroids. 4.  Stable mild hepatic steatosis and bilateral renal scarring.   Original Report Authenticated By: Myles Rosenthal, M.D.   Dg Abd Acute W/chest  06/02/2012   *RADIOLOGY  REPORT*  Clinical Data: Right lower quadrant pain.  Nausea vomiting. Diarrhea.  ACUTE ABDOMEN SERIES (ABDOMEN 2 VIEW & CHEST 1 VIEW)  Comparison: 08/01/2011  Findings: Mildly dilated small bowel loops containing air fluid levels.  There is a paucity of colonic gas.  This is suspicious for a partial or low grade small bowel obstruction.  There is no evidence of free air.  Surgical mesh seen in the lower abdominal wall.  Heart size is normal.  Both lungs are clear.  No evidence of pleural effusion.  IMPRESSION:  1.  Findings consistent with partial or low grade small bowel obstruction. 2.  No active cardiopulmonary disease.   Original Report Authenticated By: Myles Rosenthal, M.D.    Chart has been reviewed  Assessment/Plan   42 yo F here with RLQ abdominal pain possibly related to  ventral hernia who had an NG tube placed for possible SBO now with upper GI bleed.   Present on Admission:  . GI bleed - admit to stepdown, serial CBC, INR and plt wnl, Dr. Loreta Ave with GI is aware and will see in AM ASAP. On protonix gtt for now. Type and screen obtained. At this point no indication for transfusion.  Marland Kitchen DM2 (diabetes mellitus, type 2) - SSI, sensitive, decrease home dose if insuline . Abdominal pain, chronic, right lower quadrant - reviewed films with radiology no clear etiology of pain, no evidence of bowel incarceration, Surgery is aware will see in AM. Keep NPO.  Hematuria -  noted but no evidence of renal stone per CT. Can repeat UA tomorrow   Prophylaxis: SCD  Protonix gtt  CODE STATUS: Full code  Other plan as per orders.  I have spent a total of 75 min on this admission, time taken to discuss care with GI and radiology  Paolo Okane 06/03/2012, 1:31 AM

## 2012-06-03 NOTE — Progress Notes (Signed)
error 

## 2012-06-03 NOTE — ED Notes (Signed)
Dr Adela Glimpse at bedside.  NG drainage noted to be pink/red/brown at this time..  Pt c/o right flank/abd pain  Rated 9/10.  Awaiting stepdown bed

## 2012-06-03 NOTE — Progress Notes (Signed)
Utilization Review Completed. 06/03/2012  

## 2012-06-03 NOTE — ED Notes (Signed)
Pt given ice chips po per Dr Rene Paci.  NG to LIS with 500cc brown gastric contents  In cannister

## 2012-06-04 DIAGNOSIS — K56609 Unspecified intestinal obstruction, unspecified as to partial versus complete obstruction: Secondary | ICD-10-CM

## 2012-06-04 LAB — CBC
Hemoglobin: 10.1 g/dL — ABNORMAL LOW (ref 12.0–15.0)
MCV: 78.8 fL (ref 78.0–100.0)
Platelets: 240 10*3/uL (ref 150–400)
RBC: 3.97 MIL/uL (ref 3.87–5.11)
WBC: 7.7 10*3/uL (ref 4.0–10.5)

## 2012-06-04 LAB — BASIC METABOLIC PANEL
CO2: 20 mEq/L (ref 19–32)
Chloride: 108 mEq/L (ref 96–112)
Creatinine, Ser: 0.86 mg/dL (ref 0.50–1.10)
Glucose, Bld: 144 mg/dL — ABNORMAL HIGH (ref 70–99)
Sodium: 138 mEq/L (ref 135–145)

## 2012-06-04 LAB — GLUCOSE, CAPILLARY: Glucose-Capillary: 150 mg/dL — ABNORMAL HIGH (ref 70–99)

## 2012-06-04 NOTE — Progress Notes (Signed)
Patient ID: Tina [name], female   DOB: 08-08-1970, 42 y.o.   MRN: [id number] 1 Day Post-Op  Subjective: Pt feels ok.  Some nausea, but tolerating clear liquids and wants more to eat.  States pain is basically in right flanks and its a burning pain.  Objective: Vital signs in last 24 hours: Temp:  [97.7 F (36.5 C)-98.9 F (37.2 C)] 98.7 F (37.1 C) (05/23 0611) Pulse Rate:  [74-88] 80 (05/23 0611) Resp:  [12-28] 18 (05/23 0611) BP: (96-130)/(49-83) 130/66 mmHg (05/23 0611) SpO2:  [98 %-100 %] 98 % (05/23 0611) Last BM Date: 06/02/12  Intake/Output from previous day: 05/22 0701 - 05/23 0700 In: 1510 [P.O.:840; I.V.:220; IV Piggyback:450] Out: 800 [Urine:800] Intake/Output this shift:    PE: Abd: soft, NT, mild right flank tenderness, +BS, obese  Lab Results:   Recent Labs  06/03/12 0752 06/04/12 0550  WBC 7.7 7.7  HGB 10.7* 10.1*  HCT 32.6* 31.3*  PLT 262 240   BMET  Recent Labs  06/03/12 0520 06/04/12 0550  NA 144 138  K 3.4* 3.9  CL 109 108  CO2 23 20  GLUCOSE 163* 144*  BUN 17 10  CREATININE 0.94 0.86  CALCIUM 7.8* 8.2*   PT/INR  Recent Labs  06/03/12 0220  LABPROT 13.5  INR 1.04   CMP     Component Value Date/Time   NA 138 06/04/2012 0550   K 3.9 06/04/2012 0550   CL 108 06/04/2012 0550   CO2 20 06/04/2012 0550   GLUCOSE 144* 06/04/2012 0550   BUN 10 06/04/2012 0550   CREATININE 0.86 06/04/2012 0550   CALCIUM 8.2* 06/04/2012 0550   PROT 6.5 06/03/2012 0520   ALBUMIN 2.8* 06/03/2012 0520   AST 10 06/03/2012 0520   ALT 8 06/03/2012 0520   ALKPHOS 42 06/03/2012 0520   BILITOT 0.3 06/03/2012 0520   GFRNONAA 82* 06/04/2012 0550   GFRAA >90 06/04/2012 0550   Lipase     Component Value Date/Time   LIPASE 22 06/02/2012 1440       Studies/Results: Ct Abdomen Pelvis Wo Contrast  06/02/2012   *RADIOLOGY REPORT*  Clinical Data: Severe abdominal pain.  Nausea and vomiting. Previous hernia surgery.Suspect small bowel obstruction on abdominal  radiographs.  The  CT ABDOMEN AND PELVIS WITHOUT CONTRAST  Technique:  Multidetector CT imaging of the abdomen and pelvis was performed following the standard protocol without intravenous contrast.  Comparison: 10/15/2011 and 04/04/2004  Findings: Nasogastric tube is seen within the stomach.  There is no evidence of dilated bowel loops or transition point.  Contrast material is seen throughout the colon.  Mild hepatic steatosis again demonstrated.  Gallbladder is unremarkable.  The pancreas, spleen, and adrenal glands have a normal appearance on this noncontrast study as well as the gallbladder.  Bilateral renal parenchymal scarring is stable.  No evidence of renal calculi or hydronephrosis.  No evidence of ureteral calculi or dilatation.  Small uterine fibroids again noted. No evidence of inflammatory process or abnormal fluid collections.  Surgical mesh is seen in the lower anterior abdominal wall soft tissues.  A right lower quadrant ventral abdominal wall hernia is seen containing several loops of small bowel.  No evidence of bowel obstruction or ischemia.  IMPRESSION:  1.  No evidence of small bowel obstruction or other acute findings. 2.  Small right lower quadrant ventral abdominal wall hernia containing small bowel. 3.  Stable small uterine fibroids. 4.  Stable mild hepatic steatosis and bilateral renal scarring.  Original Report Authenticated By: Myles Rosenthal, M.D.   Dg Abd Acute W/chest  06/02/2012   *RADIOLOGY REPORT*  Clinical Data: Right lower quadrant pain.  Nausea vomiting. Diarrhea.  ACUTE ABDOMEN SERIES (ABDOMEN 2 VIEW & CHEST 1 VIEW)  Comparison: 08/01/2011  Findings: Mildly dilated small bowel loops containing air fluid levels.  There is a paucity of colonic gas.  This is suspicious for a partial or low grade small bowel obstruction.  There is no evidence of free air.  Surgical mesh seen in the lower abdominal wall.  Heart size is normal.  Both lungs are clear.  No evidence of pleural effusion.   IMPRESSION:  1.  Findings consistent with partial or low grade small bowel obstruction. 2.  No active cardiopulmonary disease.   Original Report Authenticated By: Myles Rosenthal, M.D.    Anti-infectives: Anti-infectives   Start     Dose/Rate Route Frequency Ordered Stop   06/02/12 1645  cefTRIAXone (ROCEPHIN) 1 g in dextrose 5 % 50 mL IVPB     1 g 100 mL/hr over 30 Minutes Intravenous  Once 06/02/12 1640 06/02/12 1741       Assessment/Plan  1. Abdominal pain, right flank 2. Nausea 3. RLQ ventral hernia 4. DM  Plan: 1. No evidence for SBO, unsure what right flank pain is secondary to.   2. She wants to eat some full liquids. 3. No surgical intervention or needs are apparent.  Will defer further evaluation and management to primary service.  We will sign off.   LOS: 2 days    Ivi Griffith E 06/04/2012, 8:34 AM Pager: [phone]

## 2012-06-04 NOTE — Progress Notes (Signed)
Patient ID: Tina [name]  female  WUJ:[id number]    DOB: 12-Feb-1970    DOA: 06/02/2012  PCP: Michiel Sites, MD  Assessment/Plan:  Principal Problem:  ? GI bleed  -EGD only revealed several areas of gastric erythema, h/h stable  - Continue PPI - Advance diet to full liquids today  Active Problems:  Nausea with vomiting/ fever: Likely viral gastroenteritis -  Resolved, diet advance to full liquids today, afebrile  Diabetes mellitus type II, uncontrolled  -A1c 6.9, CBG controlled here  -cont SSI and Lantus   Abdominal pain, chronic, right lower quadrant/Ventral hernia  -no evidence of incarcerated hernia on imaging  -appreciate surgery team assistance   Obesity (BMI 30-39.9)  - Patient counseled on diet and weight control  History of CVA (cerebrovascular accident)   HTN (hypertension)  -BP controlled  DVT Prophylaxis:  Code Status:  Disposition: Hopefully DC home in a.m. if tolerating solid diet.    Subjective: Right lower quadrant pain, nausea vomiting improved, no diarrhea, afebrile  Objective: Weight change:   Intake/Output Summary (Last 24 hours) at 06/04/12 1441 Last data filed at 06/04/12 1300  Gross per 24 hour  Intake    865 ml  Output      0 ml  Net    865 ml   Blood pressure 157/74, pulse 73, temperature 98.7 F (37.1 C), temperature source Oral, resp. rate 18, height 5\' 6"  (1.676 m), weight 107 kg (235 lb 14.3 oz), last menstrual period 06/02/2012, SpO2 100.00%.  Physical Exam: General: Alert and awake, oriented x3, not in any acute distress. CVS: S1-S2 clear, no murmur rubs or gallops Chest: clear to auscultation bilaterally, no wheezing, rales or rhonchi Abdomen: soft mild flank tenderness on the right, no rebound or guarding, obese, normal bowel sounds  Extremities: no cyanosis, clubbing or edema noted bilaterally Neuro: Cranial nerves II-XII intact, no focal neurological deficits  Lab Results: Basic Metabolic Panel:  Recent  Labs Lab 06/03/12 0520 06/04/12 0550  NA 144 138  K 3.4* 3.9  CL 109 108  CO2 23 20  GLUCOSE 163* 144*  BUN 17 10  CREATININE 0.94 0.86  CALCIUM 7.8* 8.2*  MG 1.7  --   PHOS 2.6  --    Liver Function Tests:  Recent Labs Lab 06/03/12 0220 06/03/12 0520  AST 12 10  ALT 8 8  ALKPHOS 41 42  BILITOT 0.3 0.3  PROT 6.4 6.5  ALBUMIN 2.9* 2.8*    Recent Labs Lab 06/02/12 1440  LIPASE 22   No results found for this basename: AMMONIA,  in the last 168 hours CBC:  Recent Labs Lab 06/02/12 1440  06/03/12 0752 06/04/12 0550  WBC 9.9  < > 7.7 7.7  NEUTROABS 8.6*  --   --   --   HGB 14.2  < > 10.7* 10.1*  HCT 41.7  < > 32.6* 31.3*  MCV 76.7*  < > 77.8* 78.8  PLT PLATELET CLUMPS NOTED ON SMEAR, UNABLE TO ESTIMATE  < > 262 240  < > = values in this interval not displayed. Cardiac Enzymes: No results found for this basename: CKTOTAL, CKMB, CKMBINDEX, TROPONINI,  in the last 168 hours BNP: No components found with this basename: POCBNP,  CBG:  Recent Labs Lab 06/03/12 1540 06/03/12 1937 06/03/12 2127 06/04/12 0657 06/04/12 1110  GLUCAP 135* 141* 120* 150* 134*     Micro Results: Recent Results (from the past 240 hour(s))  URINE CULTURE     Status: None   Collection  Time    06/02/12  5:01 PM      Result Value Range Status   Specimen Description URINE, CATHETERIZED   Final   Special Requests NONE   Final   Culture  Setup Time 06/02/2012 19:02   Final   Colony Count NO GROWTH   Final   Culture NO GROWTH   Final   Report Status 06/03/2012 FINAL   Final  MRSA PCR SCREENING     Status: None   Collection Time    06/03/12  4:24 AM      Result Value Range Status   MRSA by PCR NEGATIVE  NEGATIVE Final   Comment:            The GeneXpert MRSA Assay (FDA     approved for NASAL specimens     only), is one component of a     comprehensive MRSA colonization     surveillance program. It is not     intended to diagnose MRSA     infection nor to guide or      monitor treatment for     MRSA infections.    Studies/Results: Ct Abdomen Pelvis Wo Contrast  06/02/2012   *RADIOLOGY REPORT*  Clinical Data: Severe abdominal pain.  Nausea and vomiting. Previous hernia surgery.Suspect small bowel obstruction on abdominal radiographs.  The  CT ABDOMEN AND PELVIS WITHOUT CONTRAST  Technique:  Multidetector CT imaging of the abdomen and pelvis was performed following the standard protocol without intravenous contrast.  Comparison: 10/15/2011 and 04/04/2004  Findings: Nasogastric tube is seen within the stomach.  There is no evidence of dilated bowel loops or transition point.  Contrast material is seen throughout the colon.  Mild hepatic steatosis again demonstrated.  Gallbladder is unremarkable.  The pancreas, spleen, and adrenal glands have a normal appearance on this noncontrast study as well as the gallbladder.  Bilateral renal parenchymal scarring is stable.  No evidence of renal calculi or hydronephrosis.  No evidence of ureteral calculi or dilatation.  Small uterine fibroids again noted. No evidence of inflammatory process or abnormal fluid collections.  Surgical mesh is seen in the lower anterior abdominal wall soft tissues.  A right lower quadrant ventral abdominal wall hernia is seen containing several loops of small bowel.  No evidence of bowel obstruction or ischemia.  IMPRESSION:  1.  No evidence of small bowel obstruction or other acute findings. 2.  Small right lower quadrant ventral abdominal wall hernia containing small bowel. 3.  Stable small uterine fibroids. 4.  Stable mild hepatic steatosis and bilateral renal scarring.   Original Report Authenticated By: Myles Rosenthal, M.D.   Dg Abd Acute W/chest  06/02/2012   *RADIOLOGY REPORT*  Clinical Data: Right lower quadrant pain.  Nausea vomiting. Diarrhea.  ACUTE ABDOMEN SERIES (ABDOMEN 2 VIEW & CHEST 1 VIEW)  Comparison: 08/01/2011  Findings: Mildly dilated small bowel loops containing air fluid levels.  There is  a paucity of colonic gas.  This is suspicious for a partial or low grade small bowel obstruction.  There is no evidence of free air.  Surgical mesh seen in the lower abdominal wall.  Heart size is normal.  Both lungs are clear.  No evidence of pleural effusion.  IMPRESSION:  1.  Findings consistent with partial or low grade small bowel obstruction. 2.  No active cardiopulmonary disease.   Original Report Authenticated By: Myles Rosenthal, M.D.    Medications: Scheduled Meds: . docusate sodium  100 mg Oral BID  . insulin  glargine  10 Units Subcutaneous BID  . saccharomyces boulardii  250 mg Oral BID  . sodium chloride  3 mL Intravenous Q12H      LOS: 2 days   RAI,RIPUDEEP M.D. [facility] 06/04/2012, 2:41 PM Pager: [phone]  If 7PM-7AM, please contact night-coverage [email or web address] Password [email or web address]

## 2012-06-04 NOTE — Progress Notes (Signed)
Can f/u as outpt with dr gross if she needs too

## 2012-06-05 DIAGNOSIS — Z8673 Personal history of transient ischemic attack (TIA), and cerebral infarction without residual deficits: Secondary | ICD-10-CM

## 2012-06-05 LAB — GLUCOSE, CAPILLARY: Glucose-Capillary: 170 mg/dL — ABNORMAL HIGH (ref 70–99)

## 2012-06-05 MED ORDER — PANTOPRAZOLE SODIUM 40 MG PO TBEC: 40.0000 mg | DELAYED_RELEASE_TABLET | Freq: Every day | ORAL | Status: DC

## 2012-06-05 MED ORDER — SACCHAROMYCES BOULARDII 250 MG PO CAPS: 250.0000 mg | ORAL_CAPSULE | Freq: Two times a day (BID) | ORAL | Status: DC

## 2012-06-05 MED ORDER — PROMETHAZINE HCL 12.5 MG PO TABS: 12.5000 mg | ORAL_TABLET | Freq: Four times a day (QID) | ORAL | Status: DC | PRN

## 2012-06-05 MED ORDER — TRAMADOL HCL 50 MG PO TABS: 50.0000 mg | ORAL_TABLET | Freq: Three times a day (TID) | ORAL | Status: DC

## 2012-06-05 MED ORDER — TIZANIDINE HCL 4 MG PO TABS
4.0000 mg | ORAL_TABLET | Freq: Two times a day (BID) | ORAL | Status: DC
  Administered 2012-06-05: 4 mg via ORAL
  Filled 2012-06-05 (×2): qty 1

## 2012-06-05 MED ORDER — PANTOPRAZOLE SODIUM 40 MG PO TBEC
40.0000 mg | DELAYED_RELEASE_TABLET | Freq: Every day | ORAL | Status: DC
  Administered 2012-06-05: 40 mg via ORAL
  Filled 2012-06-05: qty 1

## 2012-06-05 MED ORDER — HYDROCODONE-ACETAMINOPHEN 5-325 MG PO TABS
1.0000 | ORAL_TABLET | Freq: Four times a day (QID) | ORAL | Status: DC | PRN
Start: 2012-06-05 — End: 2012-06-05

## 2012-06-05 MED ORDER — HYDROCODONE-ACETAMINOPHEN 5-325 MG PO TABS: 1.0000 | ORAL_TABLET | Freq: Four times a day (QID) | ORAL | Status: DC | PRN

## 2012-06-05 MED ORDER — TIZANIDINE HCL 4 MG PO CAPS: 4.0000 mg | ORAL_CAPSULE | Freq: Two times a day (BID) | ORAL | Status: DC

## 2012-06-05 MED ORDER — TRAMADOL HCL 50 MG PO TABS
50.0000 mg | ORAL_TABLET | Freq: Three times a day (TID) | ORAL | Status: DC
  Administered 2012-06-05: 50 mg via ORAL
  Filled 2012-06-05: qty 1

## 2012-06-05 MED ORDER — GABAPENTIN 300 MG PO CAPS
300.0000 mg | ORAL_CAPSULE | Freq: Three times a day (TID) | ORAL | Status: DC
  Administered 2012-06-05: 300 mg via ORAL
  Filled 2012-06-05 (×3): qty 1

## 2012-06-05 NOTE — Discharge Summary (Signed)
Physician Discharge Summary  Patient ID: LATARSHA ZANI MRN: [id number] DOB/AGE: [birth year] 43 y.o.  Admit date: 06/02/2012 Discharge date: 06/05/2012  Primary Care Physician:  Michiel Sites, MD  Discharge Diagnoses:    . Diabetes mellitus type II, uncontrolled . gastric erythema? GI bleed . Abdominal pain, chronic, right lower quadrant . Obesity (BMI 30-39.9) . Nausea with vomiting likely secondary to viral gastroenteritis and SBO- improved  . Ventral hernia . HTN (hypertension)  Consults:  General surgery                  Gastroenterology, Dr. Loreta Ave     Outpatient recommendations 1) due to upper GI bleed with gastric erythema on the EGD, patient was advised to hold aspirin for another 4 days (total 7days), then she can resume it.  2) patient was advised to soft diet for next few days  Allergies:   Allergies  Allergen Reactions  . Contrast Media (Iodinated Diagnostic Agents) Other (See Comments)    Difficulty breathing  . Iohexol Hives, Nausea And Vomiting and Swelling     Desc: Magnevist-gadolinium-difficulty breathing, throat swelling   . Midazolam Hcl Anaphylaxis    Difficulty breathing  . Shellfish Allergy Anaphylaxis  . Avandia (Rosiglitazone Maleate)     Patient doesn't remember reaction  . Geodon (Ziprasidone)   . Kiwi Extract Itching and Swelling  . Metformin And Related Nausea And Vomiting and Other (See Comments)    diarrhea  . Other Itching    Patient is allergic to all nuts except peanuts.      Discharge Medications:   Medication List    STOP taking these medications       aspirin 325 MG tablet      TAKE these medications       APIDRA 100 UNIT/ML injection  Generic drug:  insulin glulisine  1-12 Units by Subconjunctival route 3 (three) times daily before meals.     B-D INS SYR ULTRAFINE .3CC/30G 30G X 1/2" 0.3 ML Misc  Generic drug:  Insulin Syringe-Needle U-100     B-D ULTRAFINE III SHORT PEN 31G X 8 MM Misc  Generic drug:   Insulin Pen Needle     BYSTOLIC 10 MG tablet  Generic drug:  nebivolol  Take 10 mg by mouth daily.     diclofenac sodium 1 % Gel  Commonly known as:  VOLTAREN  Apply 2 g topically 4 (four) times daily.     EXFORGE HCT 10-320-25 MG Tabs  Generic drug:  Amlodipine-Valsartan-HCTZ  Take 1 tablet by mouth every evening.     furosemide 40 MG tablet  Commonly known as:  LASIX  Take 20-40 mg by mouth daily.     gabapentin 300 MG capsule  Commonly known as:  NEURONTIN  Take 300 mg by mouth 3 (three) times daily.     HYDROcodone-acetaminophen 5-325 MG per tablet  Commonly known as:  NORCO/VICODIN  Take 1 tablet by mouth every 6 (six) hours as needed for pain.     insulin glargine 100 UNIT/ML injection  Commonly known as:  LANTUS  Inject 20-22 Units into the skin 2 (two) times daily. Inject 22 units in the morning and 20 units in the evening     insulin lispro 100 UNIT/ML injection  Commonly known as:  HUMALOG  Inject 6-10 Units into the skin 3 (three) times daily before meals. Pt on sliding scale     INVOKANA 300 MG Tabs  Generic drug:  Canagliflozin  Take 300 mg by mouth daily.  multivitamin with minerals Tabs  Take 1 tablet by mouth daily.     ONE TOUCH ULTRA TEST test strip  Generic drug:  glucose blood     pantoprazole 40 MG tablet  Commonly known as:  PROTONIX  Take 1 tablet (40 mg total) by mouth daily.     promethazine 12.5 MG tablet  Commonly known as:  PHENERGAN  Take 1-2 tablets (12.5-25 mg total) by mouth every 6 (six) hours as needed for nausea. For nausea     saccharomyces boulardii 250 MG capsule  Commonly known as:  FLORASTOR  Take 1 capsule (250 mg total) by mouth 2 (two) times daily.     tiZANidine 4 MG capsule  Commonly known as:  ZANAFLEX  Take 4 mg by mouth 2 (two) times daily.     traMADol 50 MG tablet  Commonly known as:  ULTRAM  Take 1 tablet (50 mg total) by mouth 3 (three) times daily.         Brief H and P: For complete details  please refer to admission H and P, but in brief reports 1 week hx of RLQ abdominal abdominal pain and nausea with vomiting starting yesterday morning. She have had severe diarrhea all day. She presented to ER and was found to have SBO on plain films with elevated lactate. She had NG tube placed and then CT scan without contrast was done that showed no evidence of SBO or any acute changes. She has ventral hernia containing small bowel without evidence of incarceration. She states she is feeling somewhat better. Initially she was febrile up to 101.6. No recent antibiotic exposure except she was given ceftriaxone in ED for treatment of presumed pyelonephritis before her urine was found to be clean. When I came to evaluate the patient I noted large amount of bloody secretions coming from NG tube. Patient is on chronic aspirin for hx of CVA resulting in chronic right hemiparesis.    Hospital Course:  42 year old female patient with multiple medical problems. Known ventral hernia with previous mesh placement. Endorsed one week history of right lower quadrant abdominal pain and subsequently developed nausea and vomiting 24 hours prior to presentation as well significant diarrhea. She presented to the emergency department where she was found to have elevated serum lactate and felt to be dehydrated. In addition plain films apparently revealed a small bowel obstruction. An NG tube was placed and a CT scan without contrast was completed that showed no evidence of bowel obstruction or any acute changes. Her ventral hernia containing small bowel but no evidence of incarceration. After NG was placed patient endorsed improvement in symptoms. At presentation shows had fever 101.6. Urinalysis was apparently abnormal and concerning for a UTI also patient was given Rocephin in the emergency department. By the time the admitting physician arrived the patient's NG tube was draining bloody secretions which was concerning for acute GI  bleeding. Dr. Loreta Ave with gastroenterology was notified and since patient hemodynamically stable and not a large amount of bloody drainage it was felt patient could undergo GI evaluation in the morning. The ER physician also spoke with general surgery regarding followup of the ventral hernia.  ? GI bleed  -EGD only revealed several areas of gastric erythema, hemoglobin remained stable. Patient was placed on protonix. I recommended to hold aspirin for another 4 days. She is tolerating oral diet.     Nausea with vomiting/ fever: Likely viral gastroenteritis/ SBO-- Resolved, diet advance to full liquids today, afebrile now  Diabetes  mellitus type II, uncontrolled -A1c 6.9, CBG controlled here   Abdominal pain, chronic, right lower quadrant/Ventral hernia- -no evidence of incarcerated hernia on imaging. Patient was followed by surgery team and did not recommend any acute surgeries. Recommendation can follow up with Dr. Michaell Cowing outpatient as needed.  Obesity (BMI 30-39.9)  - Patient counseled on diet and weight control   History of CVA (cerebrovascular accident) - aspirin currently on hold for 4 days, then restart. This was explained to the patient by myself as well.  HTN (hypertension)  -BP controlled    Day of Discharge BP 130/58  Pulse 76  Temp(Src) 97.8 F (36.6 C) (Oral)  Resp 18  Ht 5\' 6"  (1.676 m)  Wt 107 kg (235 lb 14.3 oz)  BMI 38.09 kg/m2  SpO2 98%  LMP 06/02/2012  Physical Exam: General: Alert and awake oriented x3 not in any acute distress. HEENT: anicteric sclera, pupils reactive to light and accommodation CVS: S1-S2 clear no murmur rubs or gallops Chest: clear to auscultation bilaterally, no wheezing rales or rhonchi Abdomen: soft nontender, nondistended, normal bowel sounds, no organomegaly Extremities: no cyanosis, clubbing or edema noted bilaterally Neuro: Cranial nerves II-XII intact, no focal neurological deficits   The results of significant diagnostics from this  hospitalization (including imaging, microbiology, ancillary and laboratory) are listed below for reference.    LAB RESULTS: Basic Metabolic Panel:  Recent Labs Lab 06/03/12 0520 06/04/12 0550  NA 144 138  K 3.4* 3.9  CL 109 108  CO2 23 20  GLUCOSE 163* 144*  BUN 17 10  CREATININE 0.94 0.86  CALCIUM 7.8* 8.2*  MG 1.7  --   PHOS 2.6  --    Liver Function Tests:  Recent Labs Lab 06/03/12 0220 06/03/12 0520  AST 12 10  ALT 8 8  ALKPHOS 41 42  BILITOT 0.3 0.3  PROT 6.4 6.5  ALBUMIN 2.9* 2.8*    Recent Labs Lab 06/02/12 1440  LIPASE 22   No results found for this basename: AMMONIA,  in the last 168 hours CBC:  Recent Labs Lab 06/02/12 1440  06/03/12 0752 06/04/12 0550  WBC 9.9  < > 7.7 7.7  NEUTROABS 8.6*  --   --   --   HGB 14.2  < > 10.7* 10.1*  HCT 41.7  < > 32.6* 31.3*  MCV 76.7*  < > 77.8* 78.8  PLT PLATELET CLUMPS NOTED ON SMEAR, UNABLE TO ESTIMATE  < > 262 240  < > = values in this interval not displayed. Cardiac Enzymes: No results found for this basename: CKTOTAL, CKMB, CKMBINDEX, TROPONINI,  in the last 168 hours BNP: No components found with this basename: POCBNP,  CBG:  Recent Labs Lab 06/04/12 2110 06/05/12 0649  GLUCAP 170* 130*    Significant Diagnostic Studies:  Ct Abdomen Pelvis Wo Contrast  06/02/2012   *RADIOLOGY REPORT*  Clinical Data: Severe abdominal pain.  Nausea and vomiting. Previous hernia surgery.Suspect small bowel obstruction on abdominal radiographs.  The  CT ABDOMEN AND PELVIS WITHOUT CONTRAST  Technique:  Multidetector CT imaging of the abdomen and pelvis was performed following the standard protocol without intravenous contrast.  Comparison: 10/15/2011 and 04/04/2004  Findings: Nasogastric tube is seen within the stomach.  There is no evidence of dilated bowel loops or transition point.  Contrast material is seen throughout the colon.  Mild hepatic steatosis again demonstrated.  Gallbladder is unremarkable.  The pancreas,  spleen, and adrenal glands have a normal appearance on this noncontrast study as well  as the gallbladder.  Bilateral renal parenchymal scarring is stable.  No evidence of renal calculi or hydronephrosis.  No evidence of ureteral calculi or dilatation.  Small uterine fibroids again noted. No evidence of inflammatory process or abnormal fluid collections.  Surgical mesh is seen in the lower anterior abdominal wall soft tissues.  A right lower quadrant ventral abdominal wall hernia is seen containing several loops of small bowel.  No evidence of bowel obstruction or ischemia.  IMPRESSION:  1.  No evidence of small bowel obstruction or other acute findings. 2.  Small right lower quadrant ventral abdominal wall hernia containing small bowel. 3.  Stable small uterine fibroids. 4.  Stable mild hepatic steatosis and bilateral renal scarring.   Original Report Authenticated By: Myles Rosenthal, M.D.   Dg Abd Acute W/chest  06/02/2012   *RADIOLOGY REPORT*  Clinical Data: Right lower quadrant pain.  Nausea vomiting. Diarrhea.  ACUTE ABDOMEN SERIES (ABDOMEN 2 VIEW & CHEST 1 VIEW)  Comparison: 08/01/2011  Findings: Mildly dilated small bowel loops containing air fluid levels.  There is a paucity of colonic gas.  This is suspicious for a partial or low grade small bowel obstruction.  There is no evidence of free air.  Surgical mesh seen in the lower abdominal wall.  Heart size is normal.  Both lungs are clear.  No evidence of pleural effusion.  IMPRESSION:  1.  Findings consistent with partial or low grade small bowel obstruction. 2.  No active cardiopulmonary disease.   Original Report Authenticated By: Myles Rosenthal, M.D.       Disposition and Follow-up: Discharge Orders   Future Appointments Provider Department Dept Phone   08/31/2012 3:30 PM Su Monks, PA-C [facility] [phone]   Future Orders Complete By Expires     Diet Carb Modified  As directed     Discharge instructions   As directed     Comments:      Please hold Aspirin for 4 days, then you can restart it. If any bloody stools or vomiting, please HOLD and discuss with your PCP.    Increase activity slowly  As directed         DISPOSITION: Home DIET: Carb modified, soft diet ACTIVITY: As tolerated  DISCHARGE FOLLOW-UP Follow-up Information   Follow up with Michiel Sites, MD. Schedule an appointment as soon as possible for a visit in 10 days. (for hospital follow-up)    Contact information:   [address] [place] [address] [phone]       Schedule an appointment as soon as possible for a visit with Ardeth Sportsman., MD. (As needed if symptoms worsen)    Contact information:   [address] [place] [address] [phone]       Time spent on Discharge: 40 mins  Signed:   RAI,RIPUDEEP M.D. [facility] 06/05/2012, 10:56 AM Pager: [phone]

## 2012-06-17 ENCOUNTER — Telehealth: Payer: Self-pay

## 2012-06-17 NOTE — Telephone Encounter (Signed)
Refill request for gabapentin 300mg  1 capsules 3 times a day.  Please advise.

## 2012-06-17 NOTE — Telephone Encounter (Signed)
Ok to refill 

## 2012-06-18 MED ORDER — GABAPENTIN 300 MG PO CAPS: 300.0000 mg | ORAL_CAPSULE | Freq: Three times a day (TID) | ORAL | Status: DC

## 2012-06-18 NOTE — Telephone Encounter (Signed)
Gabapentin refilled

## 2012-08-27 ENCOUNTER — Encounter: Payer: Self-pay | Admitting: Physical Medicine and Rehabilitation

## 2012-08-27 ENCOUNTER — Encounter
Payer: Federal, State, Local not specified - PPO | Attending: Physical Medicine and Rehabilitation | Admitting: Physical Medicine and Rehabilitation

## 2012-08-27 VITALS — BP 141/71 | HR 78 | Resp 14 | Ht 66.0 in | Wt 234.6 lb

## 2012-08-27 DIAGNOSIS — M76899 Other specified enthesopathies of unspecified lower limb, excluding foot: Secondary | ICD-10-CM | POA: Diagnosis not present

## 2012-08-27 DIAGNOSIS — M67919 Unspecified disorder of synovium and tendon, unspecified shoulder: Secondary | ICD-10-CM | POA: Insufficient documentation

## 2012-08-27 DIAGNOSIS — R209 Unspecified disturbances of skin sensation: Secondary | ICD-10-CM | POA: Diagnosis not present

## 2012-08-27 DIAGNOSIS — I69959 Hemiplegia and hemiparesis following unspecified cerebrovascular disease affecting unspecified side: Secondary | ICD-10-CM | POA: Insufficient documentation

## 2012-08-27 DIAGNOSIS — M25519 Pain in unspecified shoulder: Secondary | ICD-10-CM | POA: Insufficient documentation

## 2012-08-27 DIAGNOSIS — M545 Low back pain, unspecified: Secondary | ICD-10-CM | POA: Diagnosis not present

## 2012-08-27 DIAGNOSIS — Z8673 Personal history of transient ischemic attack (TIA), and cerebral infarction without residual deficits: Secondary | ICD-10-CM

## 2012-08-27 DIAGNOSIS — M25559 Pain in unspecified hip: Secondary | ICD-10-CM | POA: Insufficient documentation

## 2012-08-27 DIAGNOSIS — G8929 Other chronic pain: Secondary | ICD-10-CM | POA: Diagnosis not present

## 2012-08-27 DIAGNOSIS — M719 Bursopathy, unspecified: Secondary | ICD-10-CM | POA: Insufficient documentation

## 2012-08-27 MED ORDER — DICLOFENAC SODIUM 1 % TD GEL: 2.0000 g | Freq: Four times a day (QID) | TRANSDERMAL | Status: DC

## 2012-08-27 NOTE — Patient Instructions (Signed)
Continue with your exercise program 

## 2012-08-27 NOTE — Progress Notes (Signed)
Subjective:    Patient ID: Tina [name], female    DOB: [birth year], 42 y.o.   MRN: [id number]  HPI The patient complains about chronic right shoulder pain which radiates into Her right UE. The patient also complains about numbness and tingling in her right entire forarm. Shoulder movement and lying on the shoulder aggrevates the pain, moist heat or a heating pad helps with the pain. Injection did not help, 2 month of PT and OT, with exercising daily at home has not improved her shoulder pain much in the past.  The problem has gotten better with the Voltaren gel, and continuing her exercise program. She did not go to a shoulder specialist, because it got better with those treatments. Mild lateral hip pain on the right.  Pain Inventory Average Pain 5 Pain Right Now 5 My pain is sharp, stabbing and tingling  In the last 24 hours, has pain interfered with the following? General activity 7 Relation with others 7 Enjoyment of life 9 What TIME of day is your pain at its worst? day, evening, night Sleep (in general) Fair  Pain is worse with: walking, bending, sitting, standing and some activites Pain improves with: therapy/exercise and medication Relief from Meds: 7  Mobility Do you have any goals in this area?  no  Function Do you have any goals in this area?  no  Neuro/Psych weakness numbness spasms dizziness anxiety  Prior Studies Any changes since last visit?  no  Physicians involved in your care Any changes since last visit?  no   Family History  Problem Relation Age of Onset  . Diabetes Father   . Kidney disease Father   . Other Neg Hx   . Diabetes Maternal Grandmother   . Hyperlipidemia Paternal Grandmother   . Stroke Paternal Grandmother    History   Social History  . Marital Status: Married    Spouse Name: N/A    Number of Children: N/A  . Years of Education: N/A   Social History Main Topics  . Smoking status: Never Smoker   . Smokeless tobacco:  Never Used  . Alcohol Use: No  . Drug Use: No  . Sexual Activity: Yes    Birth Control/ Protection: None   Other Topics Concern  . None   Social History Narrative  . None   Past Surgical History  Procedure Laterality Date  . Cesarean section  2012  . Uterine fibroid surgery      2 SURGERIES FOR FIBROIDS  . Ureter revision    . Ventral hernia repair  10/03/2011    Procedure: LAPAROSCOPIC VENTRAL HERNIA;  Surgeon: Ardeth Sportsman, MD;  Location: [facility];  Service: General;  Laterality: N/A;  . Hernia repair  10/03/11    ventral hernia repair  . Esophagogastroduodenoscopy N/A 06/03/2012    Procedure: ESOPHAGOGASTRODUODENOSCOPY (EGD);  Surgeon: Charna Elizabeth, MD;  Location: [facility] ENDOSCOPY;  Service: Endoscopy;  Laterality: N/A;   Past Medical History  Diagnosis Date  . Flaccid hemiplegia affecting dominant side   . Enthesopathy of hip region     RIGHT HIP PAIN WITH PAIN DOWN RIGHT LEG  . Lumbago     LOWER BACK  . Spastic hemiplegia affecting dominant side   . Panic disorder without agoraphobia   . Depression   . Anxiety   . Diabetes mellitus   . Hypertension   . Blood transfusion     IN 2012  AFTER C -SECTION  . Cerebral thrombosis with cerebral infarction JUNE 2011  RIGHT SIDED WEAKNESS ( ARM AND LEG ) AND SPASMS  . Stroke   . Right rotator cuff tear     PAIN IN RIGHT SHOULDER  . Ventral hernia     RIGHT LOWER QUADRANT-CAUSING SOME PAIN  . Cough     STARTED 09/24/11--NO OTHER SYMPTOMS.  Marland Kitchen Anemia     DURING MENSES--HAS HEAVY BLEEDING WITH PERODS  . Headache(784.0)     MIGRAINES--NOT REALLY HEADACHE-MORE LIKE PRESSURE SENSATION IN HEAD-FEELS DIZZIY AND  FAINT AS THE PRESSURE RESOLVES  . Diabetic neuropathy     BOTH FEET --COMES AND GOES  . Restless leg syndrome     DIAGNOSED BY SLEEP STUDY - PT TOLD SHE DID NOT HAVE SLEEP APNEA  . Rash     HANDS, ARMS --STATES HX OF RASH EVER SINCE CHILDBIRTH/PREGNANCY.  STATES THE RASH OFTEN OCCURS WHEN SHE IS REALLY STRESSED.  Marland Kitchen  Hernia, incisional, RLQ, s/p lap repair Sep 2013 09/02/2011   BP 141/71  Pulse 78  Resp 14  Ht 5\' 6"  (1.676 m)  Wt 234 lb 9.6 oz (106.414 kg)  BMI 37.88 kg/m2  SpO2 98%  LMP 08/22/2012    Review of Systems  Neurological: Positive for dizziness, weakness and numbness.       Spasms  Psychiatric/Behavioral: The patient is nervous/anxious.   All other systems reviewed and are negative.       Objective:   Physical Exam Constitutional: She is oriented to person, place, and time. She appears well-developed and well-nourished.  obese  HENT:  Head: Normocephalic.  Neck: Neck supple.  Musculoskeletal: She exhibits tenderness over right trochanteric bursa.  Neurological: She is alert and oriented to person, place, and time.  Skin: Skin is warm and dry.  Psychiatric: She has a normal mood and affect.  Symmetric normal motor tone is noted throughout. Normal muscle bulk. Muscle testing reveals 5/5 muscle strength of the upper extremity on the left, about 4/5 on the right, except supraspinatus 3+/5 and 5/5 of the lower extremity. Full range of motion in upper and lower extremities, except right shoulder abduction 80 degrees, external rotation 20 degrees, flexion 110 degrees .Empty can test positive on the right. ROM of spine is restricted.  DTR in the upper and lower extremity are present and symmetric 2+ on the left, on the right 3+. No clonus is noted.  Patient arises from chair with mild difficulty. Wide based gait with deminished arm swing , slight limp        Assessment & Plan:  1. Chronic right hemiparesis due to to left pontine and left basal ganglia infarcts, June 2012 with deconditioning .  2. Rotator cuff syndrome on the right, mainly supraspinatus effected.Injection , extensive PT , OT did not improve her shoulder much.Voltaren gel has helped, also showed patient specific exercises with a thera band for her right shoulder.  3. LBP, patient complains about exacerbation of her  LBP, for those I prescribed Flector patches. I educated the patient, that she should not use her Voltaren gel when she uses the Flector patches and vice versa, the patient understood. Unfortunately her insurance did not pay for the Flector patches. 4. Mild trochanteric bursitis, patient wants to try Voltaren gel first, for a week, if this does not help, she might call for a steroid injection. See me in 6 month.

## 2012-08-31 ENCOUNTER — Ambulatory Visit: Payer: Federal, State, Local not specified - PPO | Admitting: Physical Medicine and Rehabilitation

## 2012-09-06 ENCOUNTER — Other Ambulatory Visit: Payer: Self-pay | Admitting: Physical Medicine and Rehabilitation

## 2012-09-07 NOTE — Telephone Encounter (Signed)
Another provider is prescribing this, he should be contacted

## 2012-09-23 ENCOUNTER — Telehealth: Payer: Self-pay

## 2012-09-23 MED ORDER — TRAMADOL HCL 50 MG PO TABS: 50.0000 mg | ORAL_TABLET | Freq: Three times a day (TID) | ORAL | Status: DC

## 2012-09-23 MED ORDER — GABAPENTIN 300 MG PO CAPS: 300.0000 mg | ORAL_CAPSULE | Freq: Three times a day (TID) | ORAL | Status: DC

## 2012-09-23 NOTE — Telephone Encounter (Signed)
Refilled gabapentin and tramadol at [facility]. Left patient a voicemail letting her know her prescription had been called in.

## 2012-09-23 NOTE — Telephone Encounter (Signed)
Patient wanted to know if she can get a refill on her tramadol and gabapentin? She was seen on 08/27/12 Pt. thought that you had e scribed the gabapentin. She said the pharmacy does not have it. I don't see in the note whether or not you want her to continue with that medication. I only seen continue the Voltaren Gel. Please advise.

## 2012-09-23 NOTE — Telephone Encounter (Signed)
It is fine to refill both meds

## 2012-10-04 DIAGNOSIS — E119 Type 2 diabetes mellitus without complications: Secondary | ICD-10-CM | POA: Diagnosis not present

## 2012-10-04 DIAGNOSIS — Z23 Encounter for immunization: Secondary | ICD-10-CM | POA: Diagnosis not present

## 2012-10-04 DIAGNOSIS — M79609 Pain in unspecified limb: Secondary | ICD-10-CM | POA: Diagnosis not present

## 2012-11-18 ENCOUNTER — Other Ambulatory Visit: Payer: Self-pay

## 2013-01-10 ENCOUNTER — Telehealth (INDEPENDENT_AMBULATORY_CARE_PROVIDER_SITE_OTHER): Payer: Self-pay | Admitting: General Surgery

## 2013-01-10 NOTE — Telephone Encounter (Signed)
Pt called to report she is having the same symptoms "of blockage" in her bowels that she had previously.  She states she is having irregular bowel movements and sometimes cannot eat because of it.  She took [mag citrate] yesterday with some watery stool later.  Suggested she take MOM, per label instructions, resume the Miralax and stool softeners she was taking previously---all to restore regular, daily bowel movements.  Also eat foods to increase the fiber in her diet and drink lots of water.  Appt made for 01/26/13 to see Dr. Michaell Cowing; she will cancel appt if symptoms resolve with improved bowel regimen.

## 2013-01-26 ENCOUNTER — Encounter (INDEPENDENT_AMBULATORY_CARE_PROVIDER_SITE_OTHER): Payer: Self-pay | Admitting: Surgery

## 2013-01-26 ENCOUNTER — Ambulatory Visit (INDEPENDENT_AMBULATORY_CARE_PROVIDER_SITE_OTHER): Payer: Federal, State, Local not specified - PPO | Admitting: Surgery

## 2013-01-26 VITALS — BP 136/82 | HR 72 | Temp 98.6°F | Resp 14 | Ht 66.5 in | Wt 242.0 lb

## 2013-01-26 DIAGNOSIS — K432 Incisional hernia without obstruction or gangrene: Secondary | ICD-10-CM | POA: Insufficient documentation

## 2013-01-26 DIAGNOSIS — K5909 Other constipation: Secondary | ICD-10-CM

## 2013-01-26 DIAGNOSIS — K5900 Constipation, unspecified: Secondary | ICD-10-CM

## 2013-01-26 DIAGNOSIS — R1031 Right lower quadrant pain: Secondary | ICD-10-CM | POA: Diagnosis not present

## 2013-01-26 NOTE — Progress Notes (Signed)
Subjective:     Patient ID: Tina [name], female   DOB: [birth year], 43 y.o.   MRN: [id number]  HPI  Note: This dictation was prepared with Dragon/digital dictation along with [facility] technology. Any transcriptional errors that result from this process are unintentional.       [name]  Jan 14, 1970 [id number]  Patient Care Team: Anda Kraft, MD as PCP - General Sheronette Clint Bolder, MD as Consulting Physician (Obstetrics and Gynecology) Garvin Fila, MD as Consulting Physician (Neurology) [name] Craver, MD as Consulting Physician (Gastroenterology)  This patient is a 43 y.o.female who presents today for surgical evaluation at the request of Dr. Wilson Singer.   Reason for visit: Recurrent episodes of right lower quadrant pain with nausea and vomiting.  Concern for hernia recurrence  The patient has been struggling with intermittent right lower quadrant pain.  She had a large infraumbilical ventral wall suprapubic hernia at the site of her prior Pfannenstiel incision.  I did a laparoscopic repair with a large sheet of mesh including bladder takedown.  She had an episode of abdominal pain and nausea vomiting.  CAT scan done last year concerning for possible recurrence.  Seemed more like eventration to me and I think on physical exam.  She had been doing relatively well until last month when she had an episode of severe sharp pain.  Nausea and vomiting.  It took about regimen.  Constipation resolved.  She felt better.  However, she had another episode of pain after slipping and falling.  It has been a constant source of soreness for her.  She is still struggling with constipation.  MiraLax helps but she has not been taking it regularly.  No change in activity level.  She is concerned about a hernia recurrence.  She wished to be seen.  Patient Active Problem List   Diagnosis Date Noted  . Abdominal pain, RLQ (right lower quadrant), possible recurrent incisional hernia 01/26/2013  .  Constipation, chronic 01/26/2013  . ? GI bleed 06/03/2012  . Abdominal pain, chronic, right lower quadrant 06/03/2012  . Nausea with vomiting 06/03/2012  . Ventral hernia 06/03/2012  . History of CVA (cerebrovascular accident) 06/03/2012  . HTN (hypertension) 06/03/2012  . Hx of migraines 10/19/2011  . Leukocytosis 10/16/2011  . Diabetes mellitus type II, uncontrolled 10/16/2011  . Obesity (BMI 30-39.9) 09/02/2011  . Rotator cuff tear 08/25/2011  . Sciatica 06/10/2011  . Paresthesias in right hand 06/10/2011  . Lumbago 05/09/2011  . Paresthesias 05/09/2011    Past Medical History  Diagnosis Date  . Flaccid hemiplegia affecting dominant side   . Enthesopathy of hip region     RIGHT HIP PAIN WITH PAIN DOWN RIGHT LEG  . Lumbago     LOWER BACK  . Spastic hemiplegia affecting dominant side   . Panic disorder without agoraphobia   . Depression   . Anxiety   . Diabetes mellitus   . Hypertension   . Blood transfusion     IN 2012  AFTER C -SECTION  . Cerebral thrombosis with cerebral infarction JUNE 2011    RIGHT SIDED WEAKNESS ( ARM AND LEG ) AND SPASMS  . Stroke   . Right rotator cuff tear     PAIN IN RIGHT SHOULDER  . Ventral hernia     RIGHT LOWER QUADRANT-CAUSING SOME PAIN  . Cough     STARTED 09/24/11--NO OTHER SYMPTOMS.  Marland Kitchen Anemia     DURING MENSES--HAS HEAVY BLEEDING WITH PERODS  . Headache(784.0)  MIGRAINES--NOT REALLY HEADACHE-MORE LIKE PRESSURE SENSATION IN HEAD-FEELS DIZZIY AND  FAINT AS THE PRESSURE RESOLVES  . Diabetic neuropathy     BOTH FEET --COMES AND GOES  . Restless leg syndrome     DIAGNOSED BY SLEEP STUDY - PT TOLD SHE DID NOT HAVE SLEEP APNEA  . Rash     HANDS, ARMS --STATES HX OF RASH EVER SINCE CHILDBIRTH/PREGNANCY.  STATES THE RASH OFTEN OCCURS WHEN SHE IS REALLY STRESSED.  Marland Kitchen Hernia, incisional, RLQ, s/p lap repair Sep 2013 09/02/2011  . Blood transfusion without reported diagnosis   . SBO (small bowel obstruction) 06/03/2012    Past Surgical  History  Procedure Laterality Date  . Cesarean section  2012  . Uterine fibroid surgery      2 SURGERIES FOR FIBROIDS  . Ureter revision    . Ventral hernia repair  10/03/2011    Procedure: LAPAROSCOPIC VENTRAL HERNIA;  Surgeon: Adin Hector, MD;  Location: [facility];  Service: General;  Laterality: N/A;  . Hernia repair  10/03/11    ventral hernia repair  . Esophagogastroduodenoscopy N/A 06/03/2012    Procedure: ESOPHAGOGASTRODUODENOSCOPY (EGD);  Surgeon: [name] Craver, MD;  Location: [facility] ENDOSCOPY;  Service: Endoscopy;  Laterality: N/A;    History   Social History  . Marital Status: Married    Spouse Name: N/A    Number of Children: N/A  . Years of Education: N/A   Occupational History  . Not on file.   Social History Main Topics  . Smoking status: Never Smoker   . Smokeless tobacco: Never Used  . Alcohol Use: No  . Drug Use: No  . Sexual Activity: Yes    Birth Control/ Protection: None   Other Topics Concern  . Not on file   Social History Narrative  . No narrative on file    Family History  Problem Relation Age of Onset  . Diabetes Father   . Kidney disease Father   . Other Neg Hx   . Diabetes Maternal Grandmother   . Hyperlipidemia Paternal Grandmother   . Stroke Paternal Grandmother     Current Outpatient Prescriptions  Medication Sig Dispense Refill  . Amlodipine-Valsartan-HCTZ 10-320-25 MG TABS       . APIDRA 100 UNIT/ML injection 1-12 Units by Subconjunctival route 3 (three) times daily before meals.       . B-D INS SYR ULTRAFINE .3CC/30G 30G X 1/2" 0.3 ML MISC       . B-D ULTRAFINE III SHORT PEN 31G X 8 MM MISC       . BYSTOLIC 10 MG tablet Take 10 mg by mouth daily.       . diclofenac sodium (VOLTAREN) 1 % GEL Apply 2 g topically 4 (four) times daily.  2 Tube  2  . furosemide (LASIX) 40 MG tablet Take 20-40 mg by mouth daily.      Marland Kitchen gabapentin (NEURONTIN) 300 MG capsule Take 1 capsule (300 mg total) by mouth 3 (three) times daily.  90 capsule  2  .  insulin glargine (LANTUS) 100 UNIT/ML injection Inject 20-22 Units into the skin 2 (two) times daily. Inject 22 units in the morning and 20 units in the evening      . insulin lispro (HUMALOG) 100 UNIT/ML injection Inject 6-10 Units into the skin 3 (three) times daily before meals. Pt on sliding scale      . INVOKANA 300 MG TABS Take 300 mg by mouth daily.       . Multiple Vitamin (  MULTIVITAMIN WITH MINERALS) TABS Take 1 tablet by mouth daily.      . ONE TOUCH ULTRA TEST test strip       . pantoprazole (PROTONIX) 40 MG tablet Take 1 tablet (40 mg total) by mouth daily.  30 tablet  2  . promethazine (PHENERGAN) 12.5 MG tablet Take 1-2 tablets (12.5-25 mg total) by mouth every 6 (six) hours as needed for nausea. For nausea  30 tablet  0  . saccharomyces boulardii (FLORASTOR) 250 MG capsule Take 1 capsule (250 mg total) by mouth 2 (two) times daily.  60 capsule  0  . traMADol (ULTRAM) 50 MG tablet Take 1 tablet (50 mg total) by mouth 3 (three) times daily.  90 tablet  3   No current facility-administered medications for this visit.     Allergies  Allergen Reactions  . Contrast Media [Iodinated Diagnostic Agents] Other (See Comments)    Difficulty breathing  . Iohexol Hives, Nausea And Vomiting and Swelling     Desc: Magnevist-gadolinium-difficulty breathing, throat swelling   . Midazolam Hcl Anaphylaxis    Difficulty breathing  . Shellfish Allergy Anaphylaxis  . Avandia [Rosiglitazone Maleate]     Patient doesn't remember reaction  . Geodon [Ziprasidone]   . Kiwi Extract Itching and Swelling  . Metformin And Related Nausea And Vomiting and Other (See Comments)    diarrhea  . Other Itching    Patient is allergic to all nuts except peanuts.     BP 136/82  Pulse 72  Temp(Src) 98.6 F (37 C) (Temporal)  Resp 14  Ht 5' 6.5" (1.689 m)  Wt 242 lb (109.77 kg)  BMI 38.48 kg/m2  No results found.   Review of Systems  Constitutional: Positive for fever and chills. Negative for  diaphoresis, appetite change and fatigue.  HENT: Negative for ear discharge, ear pain, sore throat and trouble swallowing.   Eyes: Negative for photophobia, discharge and visual disturbance.  Respiratory: Positive for cough and wheezing. Negative for choking, chest tightness and shortness of breath.   Cardiovascular: Negative for chest pain and palpitations.  Gastrointestinal: Positive for nausea, abdominal pain, constipation and anal bleeding. Negative for vomiting, diarrhea, blood in stool, abdominal distention and rectal pain.  Endocrine: Negative for cold intolerance and heat intolerance.  Genitourinary: Negative for dysuria, frequency and difficulty urinating.  Musculoskeletal: Negative for gait problem, myalgias and neck pain.  Skin: Negative for color change, pallor and rash.  Allergic/Immunologic: Negative for environmental allergies, food allergies and immunocompromised state.  Neurological: Positive for weakness and headaches. Negative for dizziness, speech difficulty and numbness.  Hematological: Negative for adenopathy.  Psychiatric/Behavioral: Negative for confusion and agitation. The patient is not nervous/anxious.        Objective:   Physical Exam  Constitutional: She is oriented to person, place, and time. She appears well-developed and well-nourished. No distress.  HENT:  Head: Normocephalic.  Mouth/Throat: Oropharynx is clear and moist. No oropharyngeal exudate.  Eyes: Conjunctivae and EOM are normal. Pupils are equal, round, and reactive to light. No scleral icterus.  Neck: Normal range of motion. Neck supple. No tracheal deviation present.  Cardiovascular: Normal rate, regular rhythm and intact distal pulses.   Pulmonary/Chest: Effort normal and breath sounds normal. No stridor. No respiratory distress. She exhibits no tenderness.  Abdominal: Soft. She exhibits no distension and no mass. There is no tenderness. Hernia confirmed negative in the right inguinal area and  confirmed negative in the left inguinal area.    Genitourinary: No vaginal discharge found.  Musculoskeletal: Normal range of motion. She exhibits no tenderness.       Right elbow: She exhibits normal range of motion.       Left elbow: She exhibits normal range of motion.       Right wrist: She exhibits normal range of motion.       Left wrist: She exhibits normal range of motion.       Right hand: Normal strength noted.       Left hand: Normal strength noted.  Lymphadenopathy:       Head (right side): No posterior auricular adenopathy present.       Head (left side): No posterior auricular adenopathy present.    She has no cervical adenopathy.    She has no axillary adenopathy.       Right: No inguinal adenopathy present.       Left: No inguinal adenopathy present.  Neurological: She is alert and oriented to person, place, and time. No cranial nerve deficit. She exhibits normal muscle tone. Coordination normal.  Skin: Skin is warm and dry. No rash noted. She is not diaphoretic. No erythema.  Psychiatric: Her speech is normal and behavior is normal. Judgment and thought content normal. Cognition and memory are normal. She exhibits a depressed mood.       Assessment:     Recurrent right lower quadrant pain with episodes of nausea and vomiting in the setting of constipation.  Possible recurrent hernia.     Plan:     I suspect some aspect of chronic pain and constipation are contributing to her discomfort.  However, with recurrent symptoms and evidence of obstruction, I am worried she could have developed a hernia recurrence.  It seems rather surprising given the fact I used a giant piece of mesh.  Nonetheless, there was some bulging on the last CAT scan and I feel a mass nail.  However the patient several options.  I am skeptical that and improved bowel regimen and nonsteroidals are going to be adequate.  I recommended diagnostic laparoscopy with possible ventral hernia repair recurrence  seen.  May need a flank mesh placement for better overlap with the patient positioned somewhat right-sided.  The risks with the reoperation with prior mesh of bowel injury, recurrence, fistula, conversion to open or higher.  However, I worry with these episodes of severe pain that she could have a knuckle of bowel incarcerated ventral hernia.  Risk of strangulation significant given her young age.  At the very least, do laparoscopic lysis adhesions and rule out a small bowel obstruction transition point.  A panniculectomy may help decrease the pressure in recurrence, but that may be too morbid to do at this time.  She has been miserable with his recurrent pain since the holiday season.  She wants to hold off on a panniculectomy but she is interested in proceeding with laparoscopic surgery:  The anatomy & physiology of the abdominal wall was discussed.  The pathophysiology of hernias was discussed.  Natural history risks without surgery including progeressive enlargement, pain, incarceration & strangulation was discussed.   Contributors to complications such as smoking, obesity, diabetes, prior surgery, etc were discussed.   I feel the risks of no intervention will lead to serious problems that outweigh the operative risks; therefore, I recommended surgery to reduce and repair the hernia.  I explained laparoscopic techniques with possible need for an open approach.  I noted the probable use of mesh to patch and/or buttress the hernia repair  Risks  such as bleeding, infection, abscess, need for further treatment, heart attack, death, and other risks were discussed.  I noted a good likelihood this will help address the problem.   Goals of post-operative recovery were discussed as well.  Possibility that this will not correct all symptoms was explained.  I stressed the importance of low-impact activity, aggressive pain control, avoiding constipation, & not pushing through pain to minimize risk of post-operative  chronic pain or injury. Possibility of reherniation especially with smoking, obesity, diabetes, immunosuppression, and other health conditions was discussed.  We will work to minimize complications.     An educational handout further explaining the pathology & treatment options was given as well.  Questions were answered.  The patient expresses understanding & wishes to proceed with surgery.

## 2013-01-26 NOTE — Addendum Note (Signed)
Addended by: Adin Hector on: 01/26/2013 12:18 PM   Modules accepted: Orders

## 2013-01-26 NOTE — Patient Instructions (Signed)
See the Handout(s) we gave you.  Consider surgery to determine if you have a recurrent hernia.  If you do, I would plan repair with mesh and a laparoscopic possible open fashion.    Please call our office at [phone] if you wish to schedule surgery or if you have further questions / concerns.   Managing Pain  Pain after surgery or related to activity is often due to strain/injury to muscle, tendon, nerves and/or incisions.  This pain is usually short-term and will improve in a few months.   Many people find it helpful to do the following things TOGETHER to help speed the process of healing and to get back to regular activity more quickly:  1. Avoid heavy physical activity a.  no lifting greater than 20 pounds b. Do not "push through" the pain.  Listen to your body and avoid positions and maneuvers than reproduce the pain c. Walking is okay as tolerated, but go slowly and stop when getting sore.  d. Remember: If it hurts to do it, then don't do it! 2. Take Anti-inflammatory medication  a. Take with food/snack around the clock for 1-2 weeks i. This helps the muscle and nerve tissues become less irritable and calm down faster b. Choose ONE of the following over-the-counter medications: i. Naproxen 220mg  tabs (ex. Aleve) 1-2 pills twice a day  ii. Ibuprofen 200mg  tabs (ex. Advil, Motrin) 3-4 pills with every meal and just before bedtime iii. Acetaminophen 500mg  tabs (Tylenol) 1-2 pills with every meal and just before bedtime 3. Use a Heating pad or Ice/Cold Pack a. 4-6 times a day b. May use warm bath/hottub  or showers 4. Try Gentle Massage and/or Stretching  a. at the area of pain many times a day b. stop if you feel pain - do not overdo it  Try these steps together to help you body heal faster and avoid making things get worse.  Doing just one of these things may not be enough.    If you are not getting better after two weeks or are noticing you are getting worse, contact our  office for further advice; we may need to re-evaluate you & see what other things we can do to help.  GETTING TO GOOD BOWEL HEALTH. Irregular bowel habits such as constipation and diarrhea can lead to many problems over time.  Having one soft bowel movement a day is the most important way to prevent further problems.  The anorectal canal is designed to handle stretching and feces to safely manage our ability to get rid of solid waste (feces, poop, stool) out of our body.  BUT, hard constipated stools can act like ripping concrete bricks and diarrhea can be a burning fire to this very sensitive area of our body, causing inflamed hemorrhoids, anal fissures, increasing risk is perirectal abscesses, abdominal pain/bloating, an making irritable bowel worse.     The goal: ONE SOFT BOWEL MOVEMENT A DAY!  To have soft, regular bowel movements:    Drink at least 8 tall glasses of water a day.     Take plenty of fiber.  Fiber is the undigested part of plant food that passes into the colon, acting s "natures broom" to encourage bowel motility and movement.  Fiber can absorb and hold large amounts of water. This results in a larger, bulkier stool, which is soft and easier to pass. Work gradually over several weeks up to 6 servings a day of fiber (25g a day even more if  needed) in the form of: o Vegetables -- Root (potatoes, carrots, turnips), leafy green (lettuce, salad greens, celery, spinach), or cooked high residue (cabbage, broccoli, etc) o Fruit -- Fresh (unpeeled skin & pulp), Dried (prunes, apricots, cherries, etc ),  or stewed ( applesauce)  o Whole grain breads, pasta, etc (whole wheat)  o Bran cereals    Bulking Agents -- This type of water-retaining fiber generally is easily obtained each day by one of the following:  o Psyllium bran -- The psyllium plant is remarkable because its ground seeds can retain so much water. This product is available as Metamucil, Konsyl, Effersyllium, Per Diem Fiber, or the  less expensive generic preparation in drug and health food stores. Although labeled a laxative, it really is not a laxative.  o Methylcellulose -- This is another fiber derived from wood which also retains water. It is available as Citrucel. o Polyethylene Glycol - and "artificial" fiber commonly called Miralax or Glycolax.  It is helpful for people with gassy or bloated feelings with regular fiber o Flax Seed - a less gassy fiber than psyllium   No reading or other relaxing activity while on the toilet. If bowel movements take longer than 5 minutes, you are too constipated   AVOID CONSTIPATION.  High fiber and water intake usually takes care of this.  Sometimes a laxative is needed to stimulate more frequent bowel movements, but    Laxatives are not a good long-term solution as it can wear the colon out. o Osmotics (Milk of Magnesia, Fleets phosphosoda, Magnesium citrate, MiraLax, GoLytely) are safer than  o Stimulants (Senokot, Castor Oil, Dulcolax, Ex Lax)    o Do not take laxatives for more than 7days in a row.    IF SEVERELY CONSTIPATED, try a Bowel Retraining Program: o Do not use laxatives.  o Eat a diet high in roughage, such as bran cereals and leafy vegetables.  o Drink six (6) ounces of prune or apricot juice each morning.  o Eat two (2) large servings of stewed fruit each day.  o Take one (1) heaping tablespoon of a psyllium-based bulking agent twice a day. Use sugar-free sweetener when possible to avoid excessive calories.  o Eat a normal breakfast.  o Set aside 15 minutes after breakfast to sit on the toilet, but do not strain to have a bowel movement.  o If you do not have a bowel movement by the third day, use an enema and repeat the above steps.    Controlling diarrhea o Switch to liquids and simpler foods for a few days to avoid stressing your intestines further. o Avoid dairy products (especially milk & ice cream) for a short time.  The intestines often can lose the ability to  digest lactose when stressed. o Avoid foods that cause gassiness or bloating.  Typical foods include beans and other legumes, cabbage, broccoli, and dairy foods.  Every person has some sensitivity to other foods, so listen to our body and avoid those foods that trigger problems for you. o Adding fiber (Citrucel, Metamucil, psyllium, Miralax) gradually can help thicken stools by absorbing excess fluid and retrain the intestines to act more normally.  Slowly increase the dose over a few weeks.  Too much fiber too soon can backfire and cause cramping & bloating. o Probiotics (such as active yogurt, Align, etc) may help repopulate the intestines and colon with normal bacteria and calm down a sensitive digestive tract.  Most studies show it to be of mild help,  though, and such products can be costly. o Medicines:   Bismuth subsalicylate (ex. Kayopectate, Pepto Bismol) every 30 minutes for up to 6 doses can help control diarrhea.  Avoid if pregnant.   Loperamide (Immodium) can slow down diarrhea.  Start with two tablets (4mg  total) first and then try one tablet every 6 hours.  Avoid if you are having fevers or severe pain.  If you are not better or start feeling worse, stop all medicines and call your doctor for advice o Call your doctor if you are getting worse or not better.  Sometimes further testing (cultures, endoscopy, X-ray studies, bloodwork, etc) may be needed to help diagnose and treat the cause of the diarrhea.  Hernia A hernia occurs when an internal organ pushes out through a weak spot in the abdominal wall. Hernias most commonly occur in the groin and around the navel. Hernias often can be pushed back into place (reduced). Most hernias tend to get worse over time. Some abdominal hernias can get stuck in the opening (irreducible or incarcerated hernia) and cannot be reduced. An irreducible abdominal hernia which is tightly squeezed into the opening is at risk for impaired blood supply (strangulated  hernia). A strangulated hernia is a medical emergency. Because of the risk for an irreducible or strangulated hernia, surgery may be recommended to repair a hernia. CAUSES   Heavy lifting.  Prolonged coughing.  Straining to have a bowel movement.  A cut (incision) made during an abdominal surgery. HOME CARE INSTRUCTIONS   Bed rest is not required. You may continue your normal activities.  Avoid lifting more than 10 pounds (4.5 kg) or straining.  Cough gently. If you are a smoker it is best to stop. Even the best hernia repair can break down with the continual strain of coughing. Even if you do not have your hernia repaired, a cough will continue to aggravate the problem.  Do not wear anything tight over your hernia. Do not try to keep it in with an outside bandage or truss. These can damage abdominal contents if they are trapped within the hernia sac.  Eat a normal diet.  Avoid constipation. Straining over long periods of time will increase hernia size and encourage breakdown of repairs. If you cannot do this with diet alone, stool softeners may be used. SEEK IMMEDIATE MEDICAL CARE IF:   You have a fever.  You develop increasing abdominal pain.  You feel nauseous or vomit.  Your hernia is stuck outside the abdomen, looks discolored, feels hard, or is tender.  You have any changes in your bowel habits or in the hernia that are unusual for you.  You have increased pain or swelling around the hernia.  You cannot push the hernia back in place by applying gentle pressure while lying down. MAKE SURE YOU:   Understand these instructions.  Will watch your condition.  Will get help right away if you are not doing well or get worse. Document Released: 12/30/2004 Document Revised: 03/24/2011 Document Reviewed: 08/19/2007 [facility] Patient Information 2014 [employer] [place].

## 2013-01-31 ENCOUNTER — Encounter (HOSPITAL_COMMUNITY): Payer: Self-pay | Admitting: Pharmacy Technician

## 2013-02-02 ENCOUNTER — Encounter (HOSPITAL_COMMUNITY): Payer: Self-pay

## 2013-02-02 ENCOUNTER — Encounter (HOSPITAL_COMMUNITY)
Admission: RE | Admit: 2013-02-02 | Discharge: 2013-02-02 | Disposition: A | Discharge status: Home / Self Care | Payer: Federal, State, Local not specified - PPO | Source: Ambulatory Visit | Attending: Surgery | Admitting: Surgery

## 2013-02-02 DIAGNOSIS — Z01812 Encounter for preprocedural laboratory examination: Secondary | ICD-10-CM | POA: Insufficient documentation

## 2013-02-02 DIAGNOSIS — K432 Incisional hernia without obstruction or gangrene: Secondary | ICD-10-CM | POA: Insufficient documentation

## 2013-02-02 HISTORY — DX: Other chronic pain: G89.29

## 2013-02-02 HISTORY — DX: Dorsalgia, unspecified: M54.9

## 2013-02-02 HISTORY — DX: Pain in right leg: M79.604

## 2013-02-02 LAB — BASIC METABOLIC PANEL
BUN: 27 mg/dL — ABNORMAL HIGH (ref 6–23)
CO2: 23 meq/L (ref 19–32)
Calcium: 9.8 mg/dL (ref 8.4–10.5)
Chloride: 97 mEq/L (ref 96–112)
Creatinine, Ser: 1.16 mg/dL — ABNORMAL HIGH (ref 0.50–1.10)
GFR calc Af Amer: 66 mL/min — ABNORMAL LOW (ref >90)
GFR calc non Af Amer: 57 mL/min — ABNORMAL LOW (ref >90)
GLUCOSE: 302 mg/dL — AB (ref 70–99)
Potassium: 5.1 mEq/L (ref 3.7–5.3)
SODIUM: 136 meq/L — AB (ref 137–147)

## 2013-02-02 LAB — CBC
HEMATOCRIT: 40.7 % (ref 36.0–46.0)
HEMOGLOBIN: 13.4 g/dL (ref 12.0–15.0)
MCH: 26.1 pg (ref 26.0–34.0)
MCHC: 32.9 g/dL (ref 30.0–36.0)
MCV: 79.3 fL (ref 78.0–100.0)
Platelets: 308 10*3/uL (ref 150–400)
RBC: 5.13 MIL/uL — AB (ref 3.87–5.11)
RDW: 14.2 % (ref 11.5–15.5)
WBC: 9.1 10*3/uL (ref 4.0–10.5)

## 2013-02-02 LAB — HCG, SERUM, QUALITATIVE: Preg, Serum: NEGATIVE

## 2013-02-02 NOTE — Patient Instructions (Addendum)
Lunah AIZLEY STENSETH  02/02/2013                           YOUR PROCEDURE IS SCHEDULED ON: 02/04/13               PLEASE REPORT TO SHORT STAY CENTER AT : 7:15 AM               CALL THIS NUMBER IF ANY PROBLEMS THE DAY OF SURGERY :               [phone]                      REMEMBER:   Do not eat food or drink liquids AFTER MIDNIGHT   Take these medicines the morning of surgery with A SIP OF WATER: BYSTOLIC /GABAPENGTIN / TRAMADOL IF NEEDED / TAKE 1/2 DOSE OF LANTUS INSULIN THE NIGHT BEFORE SURGERY   Do not wear jewelry, make-up   Do not wear lotions, powders, or perfumes.   Do not shave legs or underarms 12 hrs. before surgery (men may shave face)  Do not bring valuables to the hospital.  Contacts, dentures or bridgework may not be worn into surgery.  Leave suitcase in the car. After surgery it may be brought to your room.  For patients admitted to the hospital more than one night, checkout time is 11:00                          The day of discharge.   Patients discharged the day of surgery will not be allowed to drive home                             If going home same day of surgery, must have someone stay with you first                           24 hrs at home and arrange for some one to drive you home from hospital.    Special Instructions:   Please read over the following fact sheets that you were given:               1. DISCONTINUE ASPIRIN AND HERBAL MEDICATIONS 5 DAYS PREOP                      2. [place]                                                X_____________________________________________________________________        Failure to follow these instructions may result in cancellation of your surgery

## 2013-02-02 NOTE — Progress Notes (Signed)
Abnormal BMET faxed to Dr. Johney Maine

## 2013-02-04 ENCOUNTER — Encounter (HOSPITAL_COMMUNITY): Payer: Self-pay

## 2013-02-04 ENCOUNTER — Encounter (HOSPITAL_COMMUNITY): Payer: Federal, State, Local not specified - PPO | Admitting: Anesthesiology

## 2013-02-04 ENCOUNTER — Encounter (HOSPITAL_COMMUNITY)
Admission: RE | Disposition: A | Discharge status: Home / Self Care | Payer: Self-pay | Source: Ambulatory Visit | Attending: Surgery

## 2013-02-04 ENCOUNTER — Inpatient Hospital Stay (HOSPITAL_COMMUNITY)
Admission: RE | Admit: 2013-02-04 | Discharge: 2013-02-09 | DRG: 336 | Disposition: A | Discharge status: Home / Self Care | Payer: Federal, State, Local not specified - PPO | Source: Ambulatory Visit | Attending: Surgery | Admitting: Surgery

## 2013-02-04 ENCOUNTER — Ambulatory Visit (HOSPITAL_COMMUNITY): Payer: Federal, State, Local not specified - PPO | Admitting: Anesthesiology

## 2013-02-04 DIAGNOSIS — G2581 Restless legs syndrome: Secondary | ICD-10-CM | POA: Diagnosis present

## 2013-02-04 DIAGNOSIS — Y838 Other surgical procedures as the cause of abnormal reaction of the patient, or of later complication, without mention of misadventure at the time of the procedure: Secondary | ICD-10-CM | POA: Diagnosis present

## 2013-02-04 DIAGNOSIS — R202 Paresthesia of skin: Secondary | ICD-10-CM | POA: Diagnosis present

## 2013-02-04 DIAGNOSIS — IMO0002 Reserved for concepts with insufficient information to code with codable children: Secondary | ICD-10-CM | POA: Diagnosis present

## 2013-02-04 DIAGNOSIS — K432 Incisional hernia without obstruction or gangrene: Secondary | ICD-10-CM | POA: Diagnosis not present

## 2013-02-04 DIAGNOSIS — F410 Panic disorder [episodic paroxysmal anxiety] without agoraphobia: Secondary | ICD-10-CM | POA: Diagnosis present

## 2013-02-04 DIAGNOSIS — K660 Peritoneal adhesions (postprocedural) (postinfection): Secondary | ICD-10-CM | POA: Diagnosis present

## 2013-02-04 DIAGNOSIS — Z79899 Other long term (current) drug therapy: Secondary | ICD-10-CM

## 2013-02-04 DIAGNOSIS — Z8673 Personal history of transient ischemic attack (TIA), and cerebral infarction without residual deficits: Secondary | ICD-10-CM | POA: Diagnosis not present

## 2013-02-04 DIAGNOSIS — Z888 Allergy status to other drugs, medicaments and biological substances status: Secondary | ICD-10-CM | POA: Diagnosis not present

## 2013-02-04 DIAGNOSIS — Z833 Family history of diabetes mellitus: Secondary | ICD-10-CM

## 2013-02-04 DIAGNOSIS — R209 Unspecified disturbances of skin sensation: Secondary | ICD-10-CM | POA: Diagnosis present

## 2013-02-04 DIAGNOSIS — E669 Obesity, unspecified: Secondary | ICD-10-CM | POA: Diagnosis present

## 2013-02-04 DIAGNOSIS — Z91018 Allergy to other foods: Secondary | ICD-10-CM

## 2013-02-04 DIAGNOSIS — K5900 Constipation, unspecified: Secondary | ICD-10-CM | POA: Diagnosis present

## 2013-02-04 DIAGNOSIS — K5909 Other constipation: Secondary | ICD-10-CM | POA: Diagnosis present

## 2013-02-04 DIAGNOSIS — R1031 Right lower quadrant pain: Secondary | ICD-10-CM | POA: Diagnosis not present

## 2013-02-04 DIAGNOSIS — Z01812 Encounter for preprocedural laboratory examination: Secondary | ICD-10-CM | POA: Diagnosis not present

## 2013-02-04 DIAGNOSIS — I10 Essential (primary) hypertension: Secondary | ICD-10-CM | POA: Diagnosis present

## 2013-02-04 DIAGNOSIS — G8929 Other chronic pain: Secondary | ICD-10-CM

## 2013-02-04 DIAGNOSIS — Z91041 Radiographic dye allergy status: Secondary | ICD-10-CM | POA: Diagnosis not present

## 2013-02-04 DIAGNOSIS — K439 Ventral hernia without obstruction or gangrene: Secondary | ICD-10-CM

## 2013-02-04 DIAGNOSIS — D649 Anemia, unspecified: Secondary | ICD-10-CM | POA: Diagnosis not present

## 2013-02-04 DIAGNOSIS — E1165 Type 2 diabetes mellitus with hyperglycemia: Secondary | ICD-10-CM | POA: Diagnosis present

## 2013-02-04 DIAGNOSIS — Z91013 Allergy to seafood: Secondary | ICD-10-CM

## 2013-02-04 DIAGNOSIS — IMO0001 Reserved for inherently not codable concepts without codable children: Secondary | ICD-10-CM | POA: Diagnosis present

## 2013-02-04 DIAGNOSIS — Z794 Long term (current) use of insulin: Secondary | ICD-10-CM

## 2013-02-04 HISTORY — PX: INSERTION OF MESH: SHX5868

## 2013-02-04 HISTORY — PX: UMBILICAL HERNIA REPAIR: SHX196

## 2013-02-04 LAB — GRAM STAIN

## 2013-02-04 LAB — GLUCOSE, CAPILLARY
GLUCOSE-CAPILLARY: 320 mg/dL — AB (ref 70–99)
GLUCOSE-CAPILLARY: 333 mg/dL — AB (ref 70–99)
Glucose-Capillary: 237 mg/dL — ABNORMAL HIGH (ref 70–99)
Glucose-Capillary: 191 mg/dL — ABNORMAL HIGH (ref 70–99)
Glucose-Capillary: 309 mg/dL — ABNORMAL HIGH (ref 70–99)

## 2013-02-04 SURGERY — REPAIR, HERNIA, UMBILICAL, LAPAROSCOPIC
Anesthesia: General

## 2013-02-04 MED ORDER — METHYLENE BLUE 1 % INJ SOLN
INTRAMUSCULAR | Status: AC
  Filled 2013-02-04: qty 10

## 2013-02-04 MED ORDER — PROMETHAZINE HCL 25 MG/ML IJ SOLN: 6.2500 mg | Freq: Four times a day (QID) | INTRAMUSCULAR | Status: DC | PRN

## 2013-02-04 MED ORDER — LACTATED RINGERS IR SOLN
Status: DC | PRN
  Administered 2013-02-04: 2000 mL

## 2013-02-04 MED ORDER — ALUM & MAG HYDROXIDE-SIMETH 200-200-20 MG/5ML PO SUSP: 30.0000 mL | Freq: Four times a day (QID) | ORAL | Status: DC | PRN

## 2013-02-04 MED ORDER — DICLOFENAC SODIUM 1 % TD GEL
2.0000 g | Freq: Four times a day (QID) | TRANSDERMAL | Status: DC
  Administered 2013-02-09: 2 g via TOPICAL
  Filled 2013-02-04: qty 100

## 2013-02-04 MED ORDER — ONDANSETRON HCL 4 MG/2ML IJ SOLN
INTRAMUSCULAR | Status: AC
  Filled 2013-02-04: qty 2

## 2013-02-04 MED ORDER — METOPROLOL TARTRATE 12.5 MG HALF TABLET
12.5000 mg | ORAL_TABLET | Freq: Two times a day (BID) | ORAL | Status: DC | PRN
  Filled 2013-02-04: qty 1

## 2013-02-04 MED ORDER — CHLORHEXIDINE GLUCONATE 4 % EX LIQD: 1.0000 "application " | Freq: Once | CUTANEOUS | Status: DC

## 2013-02-04 MED ORDER — PROPOFOL 10 MG/ML IV BOLUS
INTRAVENOUS | Status: AC
Start: 2013-02-04 — End: 2013-02-04
  Filled 2013-02-04: qty 20

## 2013-02-04 MED ORDER — NEOSTIGMINE METHYLSULFATE 1 MG/ML IJ SOLN
INTRAMUSCULAR | Status: DC | PRN
  Administered 2013-02-04: 4 mg via INTRAVENOUS

## 2013-02-04 MED ORDER — LIP MEDEX EX OINT
1.0000 "application " | TOPICAL_OINTMENT | Freq: Two times a day (BID) | CUTANEOUS | Status: DC
  Administered 2013-02-05 – 2013-02-09 (×7): 1 via TOPICAL
  Filled 2013-02-04: qty 7

## 2013-02-04 MED ORDER — STERILE WATER FOR IRRIGATION IR SOLN
Status: DC | PRN
  Administered 2013-02-04: 1000 mL via INTRAVESICAL

## 2013-02-04 MED ORDER — BISACODYL 10 MG RE SUPP: 10.0000 mg | Freq: Two times a day (BID) | RECTAL | Status: DC | PRN

## 2013-02-04 MED ORDER — INSULIN ASPART 100 UNIT/ML ~~LOC~~ SOLN
0.0000 [IU] | Freq: Every day | SUBCUTANEOUS | Status: DC
  Administered 2013-02-04: 4 [IU] via SUBCUTANEOUS
  Administered 2013-02-05 – 2013-02-08 (×2): 2 [IU] via SUBCUTANEOUS

## 2013-02-04 MED ORDER — BUPIVACAINE 0.25 % ON-Q PUMP DUAL CATH 300 ML
300.0000 mL | INJECTION | Status: DC
Start: 2013-02-04 — End: 2013-02-07
  Filled 2013-02-04: qty 300

## 2013-02-04 MED ORDER — OXYCODONE HCL 5 MG PO TABS
5.0000 mg | ORAL_TABLET | ORAL | Status: DC | PRN
  Administered 2013-02-05: 10 mg via ORAL
  Administered 2013-02-05: 5 mg via ORAL
  Administered 2013-02-05 – 2013-02-06 (×3): 10 mg via ORAL
  Filled 2013-02-04 (×2): qty 2
  Filled 2013-02-04: qty 1
  Filled 2013-02-04 (×2): qty 2

## 2013-02-04 MED ORDER — POLYETHYLENE GLYCOL 3350 17 G PO PACK
17.0000 g | PACK | Freq: Two times a day (BID) | ORAL | Status: DC | PRN
  Filled 2013-02-04: qty 1

## 2013-02-04 MED ORDER — PROMETHAZINE HCL 25 MG/ML IJ SOLN: 6.2500 mg | INTRAMUSCULAR | Status: DC | PRN

## 2013-02-04 MED ORDER — ROCURONIUM BROMIDE 100 MG/10ML IV SOLN
INTRAVENOUS | Status: DC | PRN
  Administered 2013-02-04: 10 mg via INTRAVENOUS
  Administered 2013-02-04: 20 mg via INTRAVENOUS
  Administered 2013-02-04: 5 mg via INTRAVENOUS
  Administered 2013-02-04: 10 mg via INTRAVENOUS
  Administered 2013-02-04: 5 mg via INTRAVENOUS
  Administered 2013-02-04: 50 mg via INTRAVENOUS

## 2013-02-04 MED ORDER — PHENOL 1.4 % MT LIQD
2.0000 | OROMUCOSAL | Status: DC | PRN
Start: 2013-02-04 — End: 2013-02-09

## 2013-02-04 MED ORDER — ONDANSETRON HCL 4 MG/2ML IJ SOLN
INTRAMUSCULAR | Status: DC | PRN
  Administered 2013-02-04: 4 mg via INTRAVENOUS

## 2013-02-04 MED ORDER — PROPOFOL 10 MG/ML IV BOLUS
INTRAVENOUS | Status: DC | PRN
  Administered 2013-02-04: 150 mg via INTRAVENOUS

## 2013-02-04 MED ORDER — PSYLLIUM 95 % PO PACK
1.0000 | PACK | Freq: Two times a day (BID) | ORAL | Status: DC
  Administered 2013-02-04 – 2013-02-07 (×5): 1 via ORAL
  Filled 2013-02-04 (×7): qty 1

## 2013-02-04 MED ORDER — HYDROMORPHONE HCL PF 1 MG/ML IJ SOLN
INTRAMUSCULAR | Status: AC
  Filled 2013-02-04: qty 1

## 2013-02-04 MED ORDER — CEFAZOLIN SODIUM 1-5 GM-% IV SOLN
1.0000 g | Freq: Four times a day (QID) | INTRAVENOUS | Status: AC
  Administered 2013-02-04 – 2013-02-05 (×3): 1 g via INTRAVENOUS
  Filled 2013-02-04 (×3): qty 50

## 2013-02-04 MED ORDER — FENTANYL CITRATE 0.05 MG/ML IJ SOLN
INTRAMUSCULAR | Status: AC
  Filled 2013-02-04: qty 5

## 2013-02-04 MED ORDER — METHYLENE BLUE 1 % INJ SOLN
INTRAMUSCULAR | Status: DC | PRN
  Administered 2013-02-04: 12:00:00 via INTRAVESICAL

## 2013-02-04 MED ORDER — BUPIVACAINE-EPINEPHRINE PF 0.25-1:200000 % IJ SOLN
INTRAMUSCULAR | Status: AC
  Filled 2013-02-04: qty 30

## 2013-02-04 MED ORDER — ONDANSETRON HCL 4 MG/2ML IJ SOLN
4.0000 mg | Freq: Four times a day (QID) | INTRAMUSCULAR | Status: DC | PRN
  Administered 2013-02-06 – 2013-02-07 (×2): 4 mg via INTRAVENOUS
  Filled 2013-02-04 (×2): qty 2

## 2013-02-04 MED ORDER — AMLODIPINE-VALSARTAN-HCTZ 10-320-25 MG PO TABS: 1.0000 | ORAL_TABLET | Freq: Every evening | ORAL | Status: DC

## 2013-02-04 MED ORDER — FENTANYL CITRATE 0.05 MG/ML IJ SOLN
INTRAMUSCULAR | Status: DC | PRN
  Administered 2013-02-04 (×11): 50 ug via INTRAVENOUS

## 2013-02-04 MED ORDER — KETOROLAC TROMETHAMINE 30 MG/ML IJ SOLN
INTRAMUSCULAR | Status: AC
  Filled 2013-02-04: qty 1

## 2013-02-04 MED ORDER — BUPIVACAINE-EPINEPHRINE 0.25% -1:200000 IJ SOLN
INTRAMUSCULAR | Status: DC | PRN
  Administered 2013-02-04: 95 mL

## 2013-02-04 MED ORDER — GUAIFENESIN-DM 100-10 MG/5ML PO SYRP: 15.0000 mL | ORAL_SOLUTION | ORAL | Status: DC | PRN

## 2013-02-04 MED ORDER — OXYCODONE HCL 5 MG PO TABS: 5.0000 mg | ORAL_TABLET | ORAL | Status: DC | PRN

## 2013-02-04 MED ORDER — HYDROMORPHONE HCL PF 1 MG/ML IJ SOLN
0.5000 mg | INTRAMUSCULAR | Status: DC | PRN
  Administered 2013-02-04 – 2013-02-08 (×9): 1 mg via INTRAVENOUS
  Filled 2013-02-04 (×2): qty 1
  Filled 2013-02-04: qty 2
  Filled 2013-02-04 (×6): qty 1

## 2013-02-04 MED ORDER — METRONIDAZOLE IN NACL 5-0.79 MG/ML-% IV SOLN
500.0000 mg | INTRAVENOUS | Status: AC
  Administered 2013-02-04: 500 mg via INTRAVENOUS

## 2013-02-04 MED ORDER — FENTANYL CITRATE 0.05 MG/ML IJ SOLN
INTRAMUSCULAR | Status: AC
  Filled 2013-02-04: qty 2

## 2013-02-04 MED ORDER — IRBESARTAN 300 MG PO TABS
300.0000 mg | ORAL_TABLET | Freq: Every evening | ORAL | Status: DC
  Administered 2013-02-04 – 2013-02-08 (×5): 300 mg via ORAL
  Filled 2013-02-04 (×6): qty 1

## 2013-02-04 MED ORDER — ADULT MULTIVITAMIN W/MINERALS CH
1.0000 | ORAL_TABLET | Freq: Every day | ORAL | Status: DC
  Administered 2013-02-05 – 2013-02-09 (×5): 1 via ORAL
  Filled 2013-02-04 (×6): qty 1

## 2013-02-04 MED ORDER — ASPIRIN 325 MG PO TABS
325.0000 mg | ORAL_TABLET | Freq: Every day | ORAL | Status: DC
  Administered 2013-02-04 – 2013-02-09 (×6): 325 mg via ORAL
  Filled 2013-02-04 (×6): qty 1

## 2013-02-04 MED ORDER — TRAMADOL HCL 50 MG PO TABS
50.0000 mg | ORAL_TABLET | Freq: Three times a day (TID) | ORAL | Status: DC
  Administered 2013-02-04 – 2013-02-06 (×7): 50 mg via ORAL
  Filled 2013-02-04 (×7): qty 1

## 2013-02-04 MED ORDER — CANAGLIFLOZIN 300 MG PO TABS
300.0000 mg | ORAL_TABLET | Freq: Every morning | ORAL | Status: DC
Start: 2013-02-05 — End: 2013-02-09
  Administered 2013-02-05 – 2013-02-09 (×5): 300 mg via ORAL
  Filled 2013-02-04 (×6): qty 1

## 2013-02-04 MED ORDER — LACTATED RINGERS IV SOLN
INTRAVENOUS | Status: DC
  Administered 2013-02-04 (×2): via INTRAVENOUS
  Administered 2013-02-04: 1000 mL via INTRAVENOUS

## 2013-02-04 MED ORDER — MAGIC MOUTHWASH
15.0000 mL | Freq: Four times a day (QID) | ORAL | Status: DC | PRN
  Filled 2013-02-04: qty 15

## 2013-02-04 MED ORDER — INSULIN GLARGINE 100 UNIT/ML ~~LOC~~ SOLN
22.0000 [IU] | Freq: Two times a day (BID) | SUBCUTANEOUS | Status: DC
  Administered 2013-02-04 – 2013-02-07 (×7): 22 [IU] via SUBCUTANEOUS
  Filled 2013-02-04 (×9): qty 0.22

## 2013-02-04 MED ORDER — ONDANSETRON HCL 4 MG PO TABS: 4.0000 mg | ORAL_TABLET | Freq: Four times a day (QID) | ORAL | Status: DC | PRN

## 2013-02-04 MED ORDER — METRONIDAZOLE IN NACL 5-0.79 MG/ML-% IV SOLN
INTRAVENOUS | Status: AC
  Filled 2013-02-04: qty 100

## 2013-02-04 MED ORDER — ACETAMINOPHEN 500 MG PO TABS
1000.0000 mg | ORAL_TABLET | Freq: Three times a day (TID) | ORAL | Status: AC
  Administered 2013-02-04 – 2013-02-05 (×2): 1000 mg via ORAL
  Filled 2013-02-04 (×2): qty 2

## 2013-02-04 MED ORDER — INSULIN ASPART 100 UNIT/ML ~~LOC~~ SOLN
SUBCUTANEOUS | Status: AC
  Filled 2013-02-04: qty 1

## 2013-02-04 MED ORDER — GLYCOPYRROLATE 0.2 MG/ML IJ SOLN
INTRAMUSCULAR | Status: AC
  Filled 2013-02-04: qty 3

## 2013-02-04 MED ORDER — GABAPENTIN 300 MG PO CAPS
300.0000 mg | ORAL_CAPSULE | Freq: Three times a day (TID) | ORAL | Status: DC
  Administered 2013-02-04 – 2013-02-07 (×8): 300 mg via ORAL
  Filled 2013-02-04 (×11): qty 1

## 2013-02-04 MED ORDER — CEFAZOLIN SODIUM-DEXTROSE 2-3 GM-% IV SOLR
INTRAVENOUS | Status: AC
  Filled 2013-02-04: qty 50

## 2013-02-04 MED ORDER — DEXAMETHASONE SODIUM PHOSPHATE 10 MG/ML IJ SOLN
INTRAMUSCULAR | Status: AC
  Filled 2013-02-04: qty 1

## 2013-02-04 MED ORDER — LACTATED RINGERS IV SOLN: INTRAVENOUS | Status: DC

## 2013-02-04 MED ORDER — AMLODIPINE BESYLATE 10 MG PO TABS
10.0000 mg | ORAL_TABLET | Freq: Every evening | ORAL | Status: DC
  Administered 2013-02-04 – 2013-02-08 (×5): 10 mg via ORAL
  Filled 2013-02-04 (×6): qty 1

## 2013-02-04 MED ORDER — SODIUM CHLORIDE 0.9 % IV SOLN: 250.0000 mL | INTRAVENOUS | Status: DC | PRN

## 2013-02-04 MED ORDER — TIZANIDINE HCL 4 MG PO TABS
4.0000 mg | ORAL_TABLET | Freq: Two times a day (BID) | ORAL | Status: DC
  Administered 2013-02-04 – 2013-02-07 (×5): 4 mg via ORAL
  Filled 2013-02-04 (×9): qty 1

## 2013-02-04 MED ORDER — INSULIN ASPART 100 UNIT/ML ~~LOC~~ SOLN
0.0000 [IU] | Freq: Three times a day (TID) | SUBCUTANEOUS | Status: DC
  Administered 2013-02-05 (×2): 7 [IU] via SUBCUTANEOUS
  Administered 2013-02-05: 11 [IU] via SUBCUTANEOUS
  Administered 2013-02-06 (×2): 4 [IU] via SUBCUTANEOUS
  Administered 2013-02-06: 3 [IU] via SUBCUTANEOUS
  Administered 2013-02-07 – 2013-02-08 (×2): 4 [IU] via SUBCUTANEOUS
  Administered 2013-02-08 – 2013-02-09 (×2): 3 [IU] via SUBCUTANEOUS

## 2013-02-04 MED ORDER — SODIUM CHLORIDE 0.9 % IJ SOLN
3.0000 mL | INTRAMUSCULAR | Status: DC | PRN
  Administered 2013-02-06: 3 mL via INTRAVENOUS

## 2013-02-04 MED ORDER — HYDROMORPHONE HCL PF 1 MG/ML IJ SOLN
0.2500 mg | INTRAMUSCULAR | Status: DC | PRN
  Administered 2013-02-04 (×3): 0.5 mg via INTRAVENOUS

## 2013-02-04 MED ORDER — BUPIVACAINE-EPINEPHRINE 0.25% -1:200000 IJ SOLN
INTRAMUSCULAR | Status: AC
  Filled 2013-02-04: qty 1

## 2013-02-04 MED ORDER — CEFAZOLIN SODIUM-DEXTROSE 2-3 GM-% IV SOLR
2.0000 g | INTRAVENOUS | Status: AC
  Administered 2013-02-04 (×2): 2 g via INTRAVENOUS

## 2013-02-04 MED ORDER — LACTATED RINGERS IV BOLUS (SEPSIS): 1000.0000 mL | Freq: Three times a day (TID) | INTRAVENOUS | Status: AC | PRN

## 2013-02-04 MED ORDER — GLYCOPYRROLATE 0.2 MG/ML IJ SOLN
INTRAMUSCULAR | Status: DC | PRN
  Administered 2013-02-04: .6 mg via INTRAVENOUS

## 2013-02-04 MED ORDER — DEXAMETHASONE SODIUM PHOSPHATE 10 MG/ML IJ SOLN
INTRAMUSCULAR | Status: DC | PRN
  Administered 2013-02-04: 10 mg via INTRAVENOUS

## 2013-02-04 MED ORDER — HEPARIN SODIUM (PORCINE) 5000 UNIT/ML IJ SOLN
5000.0000 [IU] | Freq: Three times a day (TID) | INTRAMUSCULAR | Status: DC
  Administered 2013-02-04 – 2013-02-09 (×14): 5000 [IU] via SUBCUTANEOUS
  Filled 2013-02-04 (×17): qty 1

## 2013-02-04 MED ORDER — BUPIVACAINE 0.25 % ON-Q PUMP DUAL CATH 300 ML
300.0000 mL | INJECTION | Status: DC
  Administered 2013-02-04: 4 mL
  Filled 2013-02-04: qty 300

## 2013-02-04 MED ORDER — LACTATED RINGERS IV SOLN
INTRAVENOUS | Status: DC
  Administered 2013-02-05 – 2013-02-06 (×3): via INTRAVENOUS

## 2013-02-04 MED ORDER — NEBIVOLOL HCL 10 MG PO TABS
10.0000 mg | ORAL_TABLET | Freq: Every morning | ORAL | Status: DC
  Administered 2013-02-05 – 2013-02-09 (×5): 10 mg via ORAL
  Filled 2013-02-04 (×5): qty 1

## 2013-02-04 MED ORDER — NEOSTIGMINE METHYLSULFATE 1 MG/ML IJ SOLN
INTRAMUSCULAR | Status: AC
  Filled 2013-02-04: qty 10

## 2013-02-04 MED ORDER — SODIUM CHLORIDE 0.9 % IV SOLN
INTRAVENOUS | Status: AC
  Administered 2013-02-04: 14:00:00 via INTRAPERITONEAL
  Filled 2013-02-04: qty 6

## 2013-02-04 MED ORDER — FUROSEMIDE 20 MG PO TABS
20.0000 mg | ORAL_TABLET | Freq: Every day | ORAL | Status: DC | PRN
  Filled 2013-02-04: qty 2

## 2013-02-04 MED ORDER — SODIUM CHLORIDE 0.9 % IJ SOLN
3.0000 mL | Freq: Two times a day (BID) | INTRAMUSCULAR | Status: DC
  Administered 2013-02-05: 3 mL via INTRAVENOUS

## 2013-02-04 MED ORDER — INSULIN ASPART 100 UNIT/ML ~~LOC~~ SOLN
0.0000 [IU] | SUBCUTANEOUS | Status: DC
  Administered 2013-02-04 (×2): 11 [IU] via SUBCUTANEOUS
  Administered 2013-02-04: 5 [IU] via SUBCUTANEOUS
  Administered 2013-02-05: 15 [IU] via SUBCUTANEOUS
  Administered 2013-02-05: 5 [IU] via SUBCUTANEOUS
  Filled 2013-02-04: qty 1

## 2013-02-04 MED ORDER — KETOROLAC TROMETHAMINE 30 MG/ML IJ SOLN
INTRAMUSCULAR | Status: DC | PRN
  Administered 2013-02-04: 30 mg via INTRAVENOUS

## 2013-02-04 MED ORDER — ROCURONIUM BROMIDE 100 MG/10ML IV SOLN
INTRAVENOUS | Status: AC
  Filled 2013-02-04: qty 1

## 2013-02-04 SURGICAL SUPPLY — 56 items
APPLIER CLIP 5 13 M/L LIGAMAX5 (MISCELLANEOUS)
APR CLP MED LRG 5 ANG JAW (MISCELLANEOUS)
BAG URINE DRAINAGE (UROLOGICAL SUPPLIES) ×2 IMPLANT
BINDER ABD UNIV 12 45-62 (WOUND CARE) ×1 IMPLANT
BINDER ABDOMINAL 46IN 62IN (WOUND CARE) ×2
BLADE HEX COATED 2.75 (ELECTRODE) IMPLANT
CABLE HI FREQUENCY MONOPOLAR (ELECTROSURGICAL) IMPLANT
CANISTER SUCTION 2500CC (MISCELLANEOUS) ×1 IMPLANT
CATH FOLEY 3WAY  5CC 16FR (CATHETERS) ×1
CATH FOLEY 3WAY 5CC 16FR (CATHETERS) IMPLANT
CATH KIT ON Q 7.5IN SLV (PAIN MANAGEMENT) ×2 IMPLANT
CHLORAPREP W/TINT 26ML (MISCELLANEOUS) ×4 IMPLANT
CLIP APPLIE 5 13 M/L LIGAMAX5 (MISCELLANEOUS) IMPLANT
DECANTER SPIKE VIAL GLASS SM (MISCELLANEOUS) ×3 IMPLANT
DEVICE SECURE STRAP 25 ABSORB (INSTRUMENTS) ×2 IMPLANT
DEVICE TROCAR PUNCTURE CLOSURE (ENDOMECHANICALS) ×2 IMPLANT
DRAPE LAPAROSCOPIC ABDOMINAL (DRAPES) ×2 IMPLANT
DRAPE LG THREE QUARTER DISP (DRAPES) ×2 IMPLANT
DRAPE UTILITY XL STRL (DRAPES) ×3 IMPLANT
DRAPE WARM FLUID 44X44 (DRAPE) ×2 IMPLANT
DRSG TEGADERM 2-3/8X2-3/4 SM (GAUZE/BANDAGES/DRESSINGS) ×6 IMPLANT
ELECT REM PT RETURN 9FT ADLT (ELECTROSURGICAL) ×2
ELECTRODE REM PT RTRN 9FT ADLT (ELECTROSURGICAL) ×1 IMPLANT
GAUZE SPONGE 2X2 8PLY STRL LF (GAUZE/BANDAGES/DRESSINGS) IMPLANT
GLOVE ECLIPSE 8.0 STRL XLNG CF (GLOVE) ×2 IMPLANT
GLOVE INDICATOR 8.0 STRL GRN (GLOVE) ×2 IMPLANT
GOWN STRL REUS W/TWL XL LVL3 (GOWN DISPOSABLE) ×12 IMPLANT
KIT BASIN OR (CUSTOM PROCEDURE TRAY) ×2 IMPLANT
MANIFOLD NEPTUNE II (INSTRUMENTS) ×1 IMPLANT
MARKER SKIN DUAL TIP RULER LAB (MISCELLANEOUS) ×2 IMPLANT
MESH VENTRALIGHT ST 10X13IN (Mesh General) ×1 IMPLANT
NDL SPNL 22GX3.5 QUINCKE BK (NEEDLE) IMPLANT
NEEDLE SPNL 22GX3.5 QUINCKE BK (NEEDLE) ×2 IMPLANT
PENCIL BUTTON HOLSTER BLD 10FT (ELECTRODE) IMPLANT
PLUG CATH AND CAP STER (CATHETERS) ×1 IMPLANT
SCALPEL HARMONIC ACE (MISCELLANEOUS) ×1 IMPLANT
SCISSORS LAP 5X35 DISP (ENDOMECHANICALS) ×2 IMPLANT
SET IRRIG TUBING LAPAROSCOPIC (IRRIGATION / IRRIGATOR) ×1 IMPLANT
SET IRRIG Y TYPE TUR BLADDER L (SET/KITS/TRAYS/PACK) ×1 IMPLANT
SLEEVE XCEL OPT CAN 5 100 (ENDOMECHANICALS) ×4 IMPLANT
SPONGE GAUZE 2X2 STER 10/PKG (GAUZE/BANDAGES/DRESSINGS) ×1
STRIP CLOSURE SKIN 1/2X4 (GAUZE/BANDAGES/DRESSINGS) ×4 IMPLANT
SUT MNCRL AB 4-0 PS2 18 (SUTURE) ×2 IMPLANT
SUT PROLENE 1 CT 1 30 (SUTURE) ×10 IMPLANT
SWAB COLLECTION DEVICE MRSA (MISCELLANEOUS) ×1 IMPLANT
SYRINGE 10CC LL (SYRINGE) ×1 IMPLANT
TACKER 5MM HERNIA 3.5CML NAB (ENDOMECHANICALS) ×2 IMPLANT
TOWEL OR 17X26 10 PK STRL BLUE (TOWEL DISPOSABLE) ×2 IMPLANT
TOWEL OR NON WOVEN STRL DISP B (DISPOSABLE) ×2 IMPLANT
TRAY FOLEY CATH 14FRSI W/METER (CATHETERS) IMPLANT
TRAY LAP CHOLE (CUSTOM PROCEDURE TRAY) ×2 IMPLANT
TROCAR BLADELESS OPT 5 100 (ENDOMECHANICALS) ×2 IMPLANT
TROCAR XCEL NON-BLD 11X100MML (ENDOMECHANICALS) ×1 IMPLANT
TUBE ANAEROBIC SPECIMEN COL (MISCELLANEOUS) ×2 IMPLANT
TUBING INSUFFLATION 10FT LAP (TUBING) ×2 IMPLANT
TUNNELER SHEATH ON-Q 16GX12 DP (PAIN MANAGEMENT) ×1 IMPLANT

## 2013-02-04 NOTE — Transfer of Care (Signed)
Immediate Anesthesia Transfer of Care Note  Patient: Tina [name]  Procedure(s) Performed: Procedure(s): LAPAROSCOPIC ventral wall hernia repair LAPAROSCOPIC LYSIS OF ADHESIONS laparoscopic exploration of abdomen  (N/A) INSERTION OF MESH (N/A)  Patient Location: PACU  Anesthesia Type:General  Level of Consciousness: awake, alert , oriented and patient cooperative  Airway & Oxygen Therapy: Patient Spontanous Breathing and Patient connected to face mask oxygen  Post-op Assessment: Report given to PACU RN and Post -op Vital signs reviewed and stable  Post vital signs: Reviewed and stable  Complications: No apparent anesthesia complications

## 2013-02-04 NOTE — Discharge Instructions (Signed)
Patient Education Sheet  °ON-Q Pain Relief System  ° °What is the ON-Q?  °The ON-Q is a balloon-type pump filled with a medicine to treat your pain. It blocks the pain in the area of your procedure. With ON-Q, you may have better pain relief and need less pain medicine. Its small size allows you to move around freely.  °How does the ON-Q work?  °The pump is attached to a catheter (small tube) near your procedure site. The pump automatically pushes the medicine through the tube. DO NOT squeeze the pump. The pump may be clipped to your clothing or dressing or placed in a small carrying case.  ° °How do I know the pump is working?  °The pump gives your medicine very slowly. It may take longer than 24 hours after your procedure to notice a change in the size and look of the pump. Your pump may look like the picture.  °In time, the outside bag on the pump will get looser and wrinkles will form in the bag.  °Talk to your doctor about taking other pain medication as needed.  °Check the following:  °o Make sure the white clamp on the tubing is open (moves freely on the tubing).  °o Make sure there are no kinks in the pump tubing.  °o Do not tape or cover the filter.  ° °What should I do with my ON-Q when sleeping?  °Make sure the pump is placed on a bedside table or on top of bed covers.  °Do not place the pump underneath the bed covers where the pump may become too warm.  °Do not place the pump on the floor or hang the pump on a bedpost.  ° °Can I take a shower with ON-Q?  °Your doctor will tell you when it’s ok for you to shower.  °Take care to protect the catheter site, pump and filter from water.  °Do not submerge the pump in water.  ° °How long will my ON-Q last?  °Depending on the size of your pump, it may take 4-5 days to give you all the medicine. All your medicine has been delivered when the outside bag is flat and a hard tube can be felt in the middle of the pump.  ° °Troubleshooting  °Tubing Disconnection  °If  the pump tubing becomes disconnected from your catheter, Remove all dressings and catheters and throw the On-Q pump away ° °Leaking  °If leaking from the pump or pump tubing occurs, then remove all dressings and catheters and throw the On-Q pump away °  °When should I call the surgeon?  °Be aware that you may experience loss of feeling (numbness) at and around the area of your procedure. If this occurs, be careful not to hurt yourself. Be careful when placing hot or cold items on the numb area.  °The following symptoms may represent a serious medical condition.  °Immediately close the clamp on the pump tubing and call your doctor or 911 in case of emergency.  °Increase in pain  °Fever, chills, sweats  °Bowel or bladder changes  °Difficulty breathing  °Redness, warmth, or discharge or excessive bleeding from the catheter site  °Dizziness or lightheadedness  °Blurred vision  °Ringing or buzzing in your ears  °Metal taste in your mouth  °Numbness or tingling around your mouth, fingers, or toes  °Drowsiness  °Confusion  ° °How to remove the ON-Q catheter?  °If your doctor has instructed you to remove the ON-Q catheter, follow   their instructions keeping in mind these steps:  -Remove the dressing covering the catheter site.  -Remove any skin adhesive strips.  -Grasp the catheter close to the skin and gently pull on the catheter. -It should be easy to remove and not painful.  -If it becomes difficult STOP and call your doctor.  -Do not cut or pull hard to remove the catheter.  -Once removed, check the catheter tip for a black marking to ensure the entire catheter was removed. Call your doctor if you do not see the black marking.  Place bandage over the catheter site as instructed by your doctor.   Source: [employer], ON-Q, and [employer] are registered trademarks and [employer] and [employer] are trademarks of [employer].  2011 [employer]. All right reserved.  10/    HERNIA REPAIR:  POST OP INSTRUCTIONS  1. DIET: Follow a light bland diet the first 24 hours after arrival home, such as soup, liquids, crackers, etc.  Be sure to include lots of fluids daily.  Avoid fast food or heavy meals as your are more likely to get nauseated.  Eat a low fat the next few days after surgery. 2. Take your usually prescribed home medications unless otherwise directed. 3. PAIN CONTROL: a. Pain is best controlled by a usual combination of three different methods TOGETHER: i. Ice/Heat ii. Over the counter pain medication iii. Prescription pain medication b. Most patients will experience some swelling and bruising around the hernia(s) such as the bellybutton, groins, or old incisions.  Ice packs or heating pads (30-60 minutes up to 6 times a day) will help. Use ice for the first few days to help decrease swelling and bruising, then switch to heat to help relax tight/sore spots and speed recovery.  Some people prefer to use ice alone, heat alone, alternating between ice & heat.  Experiment to what works for you.  Swelling and bruising can take several weeks to resolve.   c. It is helpful to take an over-the-counter pain medication regularly for the first few weeks.  Choose one of the following that works best for you: i. Naproxen (Aleve, etc)  Two 220mg  tabs twice a day ii. Ibuprofen (Advil, etc) Three 200mg  tabs four times a day (every meal & bedtime) iii. Acetaminophen (Tylenol, etc) 325-650mg  four times a day (every meal & bedtime) d. A  prescription for pain medication should be given to you upon discharge.  Take your pain medication as prescribed.  i. If you are having problems/concerns with the prescription medicine (does not control pain, nausea, vomiting, rash, itching, etc), please call us [phone] to see if we need to switch you to a different pain medicine that will work better for you and/or control your side effect better. ii. If you need a refill on your pain medication, please  contact your pharmacy.  They will contact our office to request authorization. Prescriptions will not be filled after 5 pm or on week-ends. 4. Avoid getting constipated.  Between the surgery and the pain medications, it is common to experience some constipation.  Increasing fluid intake and taking a fiber supplement (such as Metamucil, Citrucel, FiberCon, MiraLax, etc) 1-2 times a day regularly will usually help prevent this problem from occurring.  A mild laxative (prune juice, Milk of Magnesia, MiraLax, etc) should be taken according to package directions if there are no bowel movements after 48 hours.   5. Wash / shower every day.  You may shower over the dressings as they are waterproof.  6. Remove your waterproof bandages 5 days after surgery.  You may leave the incision open to air.  You may replace a dressing/Band-Aid to cover the incision for comfort if you wish.  Continue to shower over incision(s) after the dressing is off.    7. ACTIVITIES as tolerated:   a. You may resume regular (light) daily activities beginning the next day--such as daily self-care, walking, climbing stairs--gradually increasing activities as tolerated.  If you can walk 30 minutes without difficulty, it is safe to try more intense activity such as jogging, treadmill, bicycling, low-impact aerobics, swimming, etc. b. Save the most intensive and strenuous activity for last such as sit-ups, heavy lifting, contact sports, etc  Refrain from any heavy lifting or straining until you are off narcotics for pain control.   c. DO NOT PUSH THROUGH PAIN.  Let pain be your guide: If it hurts to do something, don't do it.  Pain is your body warning you to avoid that activity for another week until the pain goes down. d. You may drive when you are no longer taking prescription pain medication, you can comfortably wear a seatbelt, and you can safely maneuver your car and apply brakes. e. Dennis Bast may have sexual intercourse when it is  comfortable.  8. FOLLOW UP in our office a. Please call [facility] at [phone] to set up an appointment to see your surgeon in the office for a follow-up appointment approximately 2-3 weeks after your surgery. b. Make sure that you call for this appointment the day you arrive home to insure a convenient appointment time. 9.  IF YOU HAVE DISABILITY OR FAMILY LEAVE FORMS, BRING THEM TO THE OFFICE FOR PROCESSING.  DO NOT GIVE THEM TO YOUR DOCTOR.  WHEN TO CALL us [phone]: 1. Poor pain control 2. Reactions / problems with new medications (rash/itching, nausea, etc)  3. Fever over 101.5 F (38.5 C) 4. Inability to urinate 5. Nausea and/or vomiting 6. Worsening swelling or bruising 7. Continued bleeding from incision. 8. Increased pain, redness, or drainage from the incision   The clinic staff is available to answer your questions during regular business hours (8:30am-5pm).  Please dont hesitate to call and ask to speak to one of our nurses for clinical concerns.   If you have a medical emergency, go to the nearest emergency room or call 911.  A surgeon from [facility] is always on call at the hospitals in [facility], [place], [place], [place], [place]  [address] ?  [address] [address], [place], [place]   [address] MAIN: [phone] ? TOLL FREE: [phone] ? FAX: [phone] [email or web address]  Managing Pain  Pain after surgery or related to activity is often due to strain/injury to muscle, tendon, nerves and/or incisions.  This pain is usually short-term and will improve in a few months.   Many people find it helpful to do the following things TOGETHER to help speed the process of healing and to get back to regular activity more quickly:  1. Avoid heavy physical activity a.  no lifting greater than 20 pounds b. Do not push through the pain.  Listen to your body and avoid positions and maneuvers than reproduce the  pain c. Walking is okay as tolerated, but go slowly and stop when getting sore.  d. Remember: If it hurts to do it, then dont do it! 2. Take Anti-inflammatory medication  a. Take with food/snack around the clock for 1-2 weeks  i. This helps the muscle and nerve tissues become less irritable and calm down faster b. Choose ONE of the following over-the-counter medications: i. Naproxen 220mg  tabs (ex. Aleve) 1-2 pills twice a day  ii. Ibuprofen 200mg  tabs (ex. Advil, Motrin) 3-4 pills with every meal and just before bedtime iii. Acetaminophen 500mg  tabs (Tylenol) 1-2 pills with every meal and just before bedtime 3. Use a Heating pad or Ice/Cold Pack a. 4-6 times a day b. May use warm bath/hottub  or showers 4. Try Gentle Massage and/or Stretching  a. at the area of pain many times a day b. stop if you feel pain - do not overdo it  Try these steps together to help you body heal faster and avoid making things get worse.  Doing just one of these things may not be enough.    If you are not getting better after two weeks or are noticing you are getting worse, contact our office for further advice; we may need to re-evaluate you & see what other things we can do to help.

## 2013-02-04 NOTE — Progress Notes (Signed)
CBG 237. Call to Dr Winfred Leeds

## 2013-02-04 NOTE — Anesthesia Postprocedure Evaluation (Signed)
Anesthesia Post Note  Patient: Tina [name]  Procedure(s) Performed: Procedure(s) (LRB): LAPAROSCOPIC ventral wall hernia repair LAPAROSCOPIC LYSIS OF ADHESIONS laparoscopic exploration of abdomen  (N/A) INSERTION OF MESH (N/A)  Anesthesia type: General  Patient location: PACU  Post pain: Pain level controlled  Post assessment: Post-op Vital signs reviewed  Last Vitals:  Filed Vitals:   02/04/13 1530  BP: 120/58  Pulse: 71  Temp: 37.1 C  Resp: 16    Post vital signs: Reviewed  Level of consciousness: sedated  Complications: No apparent anesthesia complications

## 2013-02-04 NOTE — Progress Notes (Signed)
PACU note----Dr. Winfred Leeds, anesth. Notified of pt's CBG result; order rec'd and insulin given

## 2013-02-04 NOTE — H&P (View-Only) (Signed)
Subjective:     Patient ID: Tina [name], female   DOB: [birth year], 43 y.o.   MRN: [id number]  HPI  Note: This dictation was prepared with Dragon/digital dictation along with [facility] technology. Any transcriptional errors that result from this process are unintentional.       Tina [name]  Jan 14, 1970 [id number]  Patient Care Team: Anda Kraft, MD as PCP - General Sheronette Clint Bolder, MD as Consulting Physician (Obstetrics and Gynecology) Garvin Fila, MD as Consulting Physician (Neurology) [name] Craver, MD as Consulting Physician (Gastroenterology)  This patient is a 43 y.o.female who presents today for surgical evaluation at the request of Dr. Wilson Singer.   Reason for visit: Recurrent episodes of right lower quadrant pain with nausea and vomiting.  Concern for hernia recurrence  The patient has been struggling with intermittent right lower quadrant pain.  She had a large infraumbilical ventral wall suprapubic hernia at the site of her prior Pfannenstiel incision.  I did a laparoscopic repair with a large sheet of mesh including bladder takedown.  She had an episode of abdominal pain and nausea vomiting.  CAT scan done last year concerning for possible recurrence.  Seemed more like eventration to me and I think on physical exam.  She had been doing relatively well until last month when she had an episode of severe sharp pain.  Nausea and vomiting.  It took about regimen.  Constipation resolved.  She felt better.  However, she had another episode of pain after slipping and falling.  It has been a constant source of soreness for her.  She is still struggling with constipation.  MiraLax helps but she has not been taking it regularly.  No change in activity level.  She is concerned about a hernia recurrence.  She wished to be seen.  Patient Active Problem List   Diagnosis Date Noted  . Abdominal pain, RLQ (right lower quadrant), possible recurrent incisional hernia 01/26/2013  .  Constipation, chronic 01/26/2013  . ? GI bleed 06/03/2012  . Abdominal pain, chronic, right lower quadrant 06/03/2012  . Nausea with vomiting 06/03/2012  . Ventral hernia 06/03/2012  . History of CVA (cerebrovascular accident) 06/03/2012  . HTN (hypertension) 06/03/2012  . Hx of migraines 10/19/2011  . Leukocytosis 10/16/2011  . Diabetes mellitus type II, uncontrolled 10/16/2011  . Obesity (BMI 30-39.9) 09/02/2011  . Rotator cuff tear 08/25/2011  . Sciatica 06/10/2011  . Paresthesias in right hand 06/10/2011  . Lumbago 05/09/2011  . Paresthesias 05/09/2011    Past Medical History  Diagnosis Date  . Flaccid hemiplegia affecting dominant side   . Enthesopathy of hip region     RIGHT HIP PAIN WITH PAIN DOWN RIGHT LEG  . Lumbago     LOWER BACK  . Spastic hemiplegia affecting dominant side   . Panic disorder without agoraphobia   . Depression   . Anxiety   . Diabetes mellitus   . Hypertension   . Blood transfusion     IN 2012  AFTER C -SECTION  . Cerebral thrombosis with cerebral infarction JUNE 2011    RIGHT SIDED WEAKNESS ( ARM AND LEG ) AND SPASMS  . Stroke   . Right rotator cuff tear     PAIN IN RIGHT SHOULDER  . Ventral hernia     RIGHT LOWER QUADRANT-CAUSING SOME PAIN  . Cough     STARTED 09/24/11--NO OTHER SYMPTOMS.  Marland Kitchen Anemia     DURING MENSES--HAS HEAVY BLEEDING WITH PERODS  . Headache(784.0)  MIGRAINES--NOT REALLY HEADACHE-MORE LIKE PRESSURE SENSATION IN HEAD-FEELS DIZZIY AND  FAINT AS THE PRESSURE RESOLVES  . Diabetic neuropathy     BOTH FEET --COMES AND GOES  . Restless leg syndrome     DIAGNOSED BY SLEEP STUDY - PT TOLD SHE DID NOT HAVE SLEEP APNEA  . Rash     HANDS, ARMS --STATES HX OF RASH EVER SINCE CHILDBIRTH/PREGNANCY.  STATES THE RASH OFTEN OCCURS WHEN SHE IS REALLY STRESSED.  Marland Kitchen Hernia, incisional, RLQ, s/p lap repair Sep 2013 09/02/2011  . Blood transfusion without reported diagnosis   . SBO (small bowel obstruction) 06/03/2012    Past Surgical  History  Procedure Laterality Date  . Cesarean section  2012  . Uterine fibroid surgery      2 SURGERIES FOR FIBROIDS  . Ureter revision    . Ventral hernia repair  10/03/2011    Procedure: LAPAROSCOPIC VENTRAL HERNIA;  Surgeon: Adin Hector, MD;  Location: [facility] ORS;  Service: General;  Laterality: N/A;  . Hernia repair  10/03/11    ventral hernia repair  . Esophagogastroduodenoscopy N/A 06/03/2012    Procedure: ESOPHAGOGASTRODUODENOSCOPY (EGD);  Surgeon: [name] Craver, MD;  Location: [facility] ENDOSCOPY;  Service: Endoscopy;  Laterality: N/A;    History   Social History  . Marital Status: Married    Spouse Name: N/A    Number of Children: N/A  . Years of Education: N/A   Occupational History  . Not on file.   Social History Main Topics  . Smoking status: Never Smoker   . Smokeless tobacco: Never Used  . Alcohol Use: No  . Drug Use: No  . Sexual Activity: Yes    Birth Control/ Protection: None   Other Topics Concern  . Not on file   Social History Narrative  . No narrative on file    Family History  Problem Relation Age of Onset  . Diabetes Father   . Kidney disease Father   . Other Neg Hx   . Diabetes Maternal Grandmother   . Hyperlipidemia Paternal Grandmother   . Stroke Paternal Grandmother     Current Outpatient Prescriptions  Medication Sig Dispense Refill  . Amlodipine-Valsartan-HCTZ 10-320-25 MG TABS       . APIDRA 100 UNIT/ML injection 1-12 Units by Subconjunctival route 3 (three) times daily before meals.       . B-D INS SYR ULTRAFINE .3CC/30G 30G X 1/2" 0.3 ML MISC       . B-D ULTRAFINE III SHORT PEN 31G X 8 MM MISC       . BYSTOLIC 10 MG tablet Take 10 mg by mouth daily.       . diclofenac sodium (VOLTAREN) 1 % GEL Apply 2 g topically 4 (four) times daily.  2 Tube  2  . furosemide (LASIX) 40 MG tablet Take 20-40 mg by mouth daily.      Marland Kitchen gabapentin (NEURONTIN) 300 MG capsule Take 1 capsule (300 mg total) by mouth 3 (three) times daily.  90 capsule  2  .  insulin glargine (LANTUS) 100 UNIT/ML injection Inject 20-22 Units into the skin 2 (two) times daily. Inject 22 units in the morning and 20 units in the evening      . insulin lispro (HUMALOG) 100 UNIT/ML injection Inject 6-10 Units into the skin 3 (three) times daily before meals. Pt on sliding scale      . INVOKANA 300 MG TABS Take 300 mg by mouth daily.       . Multiple Vitamin (  MULTIVITAMIN WITH MINERALS) TABS Take 1 tablet by mouth daily.      . ONE TOUCH ULTRA TEST test strip       . pantoprazole (PROTONIX) 40 MG tablet Take 1 tablet (40 mg total) by mouth daily.  30 tablet  2  . promethazine (PHENERGAN) 12.5 MG tablet Take 1-2 tablets (12.5-25 mg total) by mouth every 6 (six) hours as needed for nausea. For nausea  30 tablet  0  . saccharomyces boulardii (FLORASTOR) 250 MG capsule Take 1 capsule (250 mg total) by mouth 2 (two) times daily.  60 capsule  0  . traMADol (ULTRAM) 50 MG tablet Take 1 tablet (50 mg total) by mouth 3 (three) times daily.  90 tablet  3   No current facility-administered medications for this visit.     Allergies  Allergen Reactions  . Contrast Media [Iodinated Diagnostic Agents] Other (See Comments)    Difficulty breathing  . Iohexol Hives, Nausea And Vomiting and Swelling     Desc: Magnevist-gadolinium-difficulty breathing, throat swelling   . Midazolam Hcl Anaphylaxis    Difficulty breathing  . Shellfish Allergy Anaphylaxis  . Avandia [Rosiglitazone Maleate]     Patient doesn't remember reaction  . Geodon [Ziprasidone]   . Kiwi Extract Itching and Swelling  . Metformin And Related Nausea And Vomiting and Other (See Comments)    diarrhea  . Other Itching    Patient is allergic to all nuts except peanuts.     BP 136/82  Pulse 72  Temp(Src) 98.6 F (37 C) (Temporal)  Resp 14  Ht 5' 6.5" (1.689 m)  Wt 242 lb (109.77 kg)  BMI 38.48 kg/m2  No results found.   Review of Systems  Constitutional: Positive for fever and chills. Negative for  diaphoresis, appetite change and fatigue.  HENT: Negative for ear discharge, ear pain, sore throat and trouble swallowing.   Eyes: Negative for photophobia, discharge and visual disturbance.  Respiratory: Positive for cough and wheezing. Negative for choking, chest tightness and shortness of breath.   Cardiovascular: Negative for chest pain and palpitations.  Gastrointestinal: Positive for nausea, abdominal pain, constipation and anal bleeding. Negative for vomiting, diarrhea, blood in stool, abdominal distention and rectal pain.  Endocrine: Negative for cold intolerance and heat intolerance.  Genitourinary: Negative for dysuria, frequency and difficulty urinating.  Musculoskeletal: Negative for gait problem, myalgias and neck pain.  Skin: Negative for color change, pallor and rash.  Allergic/Immunologic: Negative for environmental allergies, food allergies and immunocompromised state.  Neurological: Positive for weakness and headaches. Negative for dizziness, speech difficulty and numbness.  Hematological: Negative for adenopathy.  Psychiatric/Behavioral: Negative for confusion and agitation. The patient is not nervous/anxious.        Objective:   Physical Exam  Constitutional: She is oriented to person, place, and time. She appears well-developed and well-nourished. No distress.  HENT:  Head: Normocephalic.  Mouth/Throat: Oropharynx is clear and moist. No oropharyngeal exudate.  Eyes: Conjunctivae and EOM are normal. Pupils are equal, round, and reactive to light. No scleral icterus.  Neck: Normal range of motion. Neck supple. No tracheal deviation present.  Cardiovascular: Normal rate, regular rhythm and intact distal pulses.   Pulmonary/Chest: Effort normal and breath sounds normal. No stridor. No respiratory distress. She exhibits no tenderness.  Abdominal: Soft. She exhibits no distension and no mass. There is no tenderness. Hernia confirmed negative in the right inguinal area and  confirmed negative in the left inguinal area.    Genitourinary: No vaginal discharge found.  Musculoskeletal: Normal range of motion. She exhibits no tenderness.       Right elbow: She exhibits normal range of motion.       Left elbow: She exhibits normal range of motion.       Right wrist: She exhibits normal range of motion.       Left wrist: She exhibits normal range of motion.       Right hand: Normal strength noted.       Left hand: Normal strength noted.  Lymphadenopathy:       Head (right side): No posterior auricular adenopathy present.       Head (left side): No posterior auricular adenopathy present.    She has no cervical adenopathy.    She has no axillary adenopathy.       Right: No inguinal adenopathy present.       Left: No inguinal adenopathy present.  Neurological: She is alert and oriented to person, place, and time. No cranial nerve deficit. She exhibits normal muscle tone. Coordination normal.  Skin: Skin is warm and dry. No rash noted. She is not diaphoretic. No erythema.  Psychiatric: Her speech is normal and behavior is normal. Judgment and thought content normal. Cognition and memory are normal. She exhibits a depressed mood.       Assessment:     Recurrent right lower quadrant pain with episodes of nausea and vomiting in the setting of constipation.  Possible recurrent hernia.     Plan:     I suspect some aspect of chronic pain and constipation are contributing to her discomfort.  However, with recurrent symptoms and evidence of obstruction, I am worried she could have developed a hernia recurrence.  It seems rather surprising given the fact I used a giant piece of mesh.  Nonetheless, there was some bulging on the last CAT scan and I feel a mass nail.  However the patient several options.  I am skeptical that and improved bowel regimen and nonsteroidals are going to be adequate.  I recommended diagnostic laparoscopy with possible ventral hernia repair recurrence  seen.  May need a flank mesh placement for better overlap with the patient positioned somewhat right-sided.  The risks with the reoperation with prior mesh of bowel injury, recurrence, fistula, conversion to open or higher.  However, I worry with these episodes of severe pain that she could have a knuckle of bowel incarcerated ventral hernia.  Risk of strangulation significant given her young age.  At the very least, do laparoscopic lysis adhesions and rule out a small bowel obstruction transition point.  A panniculectomy may help decrease the pressure in recurrence, but that may be too morbid to do at this time.  She has been miserable with his recurrent pain since the holiday season.  She wants to hold off on a panniculectomy but she is interested in proceeding with laparoscopic surgery:  The anatomy & physiology of the abdominal wall was discussed.  The pathophysiology of hernias was discussed.  Natural history risks without surgery including progeressive enlargement, pain, incarceration & strangulation was discussed.   Contributors to complications such as smoking, obesity, diabetes, prior surgery, etc were discussed.   I feel the risks of no intervention will lead to serious problems that outweigh the operative risks; therefore, I recommended surgery to reduce and repair the hernia.  I explained laparoscopic techniques with possible need for an open approach.  I noted the probable use of mesh to patch and/or buttress the hernia repair  Risks  such as bleeding, infection, abscess, need for further treatment, heart attack, death, and other risks were discussed.  I noted a good likelihood this will help address the problem.   Goals of post-operative recovery were discussed as well.  Possibility that this will not correct all symptoms was explained.  I stressed the importance of low-impact activity, aggressive pain control, avoiding constipation, & not pushing through pain to minimize risk of post-operative  chronic pain or injury. Possibility of reherniation especially with smoking, obesity, diabetes, immunosuppression, and other health conditions was discussed.  We will work to minimize complications.     An educational handout further explaining the pathology & treatment options was given as well.  Questions were answered.  The patient expresses understanding & wishes to proceed with surgery.

## 2013-02-04 NOTE — Preoperative (Signed)
Beta Blockers   Reason not to administer Beta Blockers:Not Applicable 

## 2013-02-04 NOTE — Op Note (Signed)
02/04/2013  2:00 PM  PATIENT:  Tina [name]  43 y.o. female  Patient Care Team: Anda Kraft, MD as PCP - General Sheronette Clint Bolder, MD as Consulting Physician (Obstetrics and Gynecology) Garvin Fila, MD as Consulting Physician (Neurology) [name] Craver, MD as Consulting Physician (Gastroenterology)  PRE-OPERATIVE DIAGNOSIS:  RIGHT LOWER QUADRANT ABDOMINAL PAIN   POST-OPERATIVE DIAGNOSIS:  Recurrent ventral wall incisional hernia  PROCEDURE:  Procedure(s):   LAPAROSCOPIC LYSIS OF ADHESIONS 52min (1/2 case) LAPAROSCOPIC ventral wall hernia repair  INSERTION OF MESH  SURGEON:  Surgeon(s): Adin Hector, MD  ASSISTANT: RN   ANESTHESIA:   local and general  EBL:  Total I/O In: 1000 [I.V.:1000] Out: 500 [Urine:400; Blood:100]  Delay start of Pharmacological VTE agent (>24hrs) due to surgical blood loss or risk of bleeding:  no  DRAINS: none   SPECIMEN:  Source of Specimen:  Hernia sac/old mesh  DISPOSITION OF SPECIMEN:  Microbiology & Pathology  COUNTS:  YES  PLAN OF CARE: Admit for overnight observation  PATIENT DISPOSITION:  PACU - hemodynamically stable.  INDICATION: Pleasant patient has developed a ventral wall abdominal hernia.     Recommendation was made for surgical exploration & possible repair of a recurrent hernia:  The anatomy & physiology of the abdominal wall was discussed. The pathophysiology of hernias was discussed. Natural history risks without surgery including progeressive enlargement, pain, incarceration & strangulation was discussed. Contributors to complications such as smoking, obesity, diabetes, prior surgery, etc were discussed.  I feel the risks of no intervention will lead to serious problems that outweigh the operative risks; therefore, I recommended surgery to reduce and repair the hernia. I explained laparoscopic techniques with possible need for an open approach. I noted the probable use of mesh to patch and/or buttress the  hernia repair.   Risks such as bleeding, infection, abscess, need for further treatment, heart attack, death, and other risks were discussed. I noted a good likelihood this will help address the problem. Goals of post-operative recovery were discussed as well. Possibility that this will not correct all symptoms was explained. I stressed the importance of low-impact activity, aggressive pain control, avoiding constipation, & not pushing through pain to minimize risk of post-operative chronic pain or injury. Possibility of reherniation especially with smoking, obesity, diabetes, immunosuppression, and other health conditions was discussed. We will work to minimize complications.  An educational handout further explaining the pathology & treatment options was given as well. Questions were answered. The patient expresses understanding & wishes to proceed with surgery.   OR FINDINGS: Poor incorporation of central part of mesh with persistent hernia sac.  Seromas.  No abscess.  Gram stain negative for organisms.  8 x 7 recurrent area of hernia.  Outer 2/3 rim of old Physiomesh well incorporated.  Dense adhesions of small bowel to central part of mesh.  Some incorporation.  No fistula.  Type of repair - Laparoscopic underlay repair   Name of mesh - Bard Ventralight dual sided (polypropylene / Seprafilm)  Size of mesh - Length 33 cm, Width 25 cm  Mesh overlap - 7-10 cm  Placement of mesh - Intraperitoneal underlay repair   DESCRIPTION:   Informed consent was confirmed. The patient underwent general anaesthesia without difficulty. The patient was positioned appropriately. VTE prevention in place. The patient's abdomen was clipped, prepped, & draped in a sterile fashion. Surgical timeout confirmed our plan.  The patient was positioned in reverse Trendelenburg. Abdominal entry was gained using optical entry technique in the left  upper abdomen. Entry was clean. I induced carbon dioxide insufflation.  Camera inspection revealed no injury. Extra ports were carefully placed under direct laparoscopic visualization.   I could see Moderate adhesions of omentum and small bowel to the superior and central parts of the large physio-mesh.  Freed off the greater omentum without difficulty.  The small bowel was densely adherent and incorporating into the central part of the physio- mesh was some separation.  I trimmed the center part of the mesh off to get the small bowel down.  I streamed off some moderate and hernia sac and seromatous within the area.  No purulence.  I send a Gram stain.  It was negative for any organisms.  I freed off the preperitoneum on the right flank over towards above the bladder using harmonic scalpel.   We insufflated the bladder with methylene blue and stained sterile solution.  Bladder filled well with no evidence of leak.  Desufflated.  This provided better mobility down on the right pelvis for a larger sheet of mesh.  We did copious irrigation with several liters of laparoscopic irrigation.  A final irrigation of antibiotic solution (clindamycin/gentamicin).  I left that in place for the last hour of the case.  I primarily closed and the obvious right lower quadrant hernia defect primarily using #1 PDS interrupted with a laparoscopic suture passer.  I tied the sutures down with the abdomen desufflated.  It came together well.  To ensure that I would have at least 5 cm radial coverage outside of the hernia defect, I chose a 33x25 cm dual sided mesh.  I placed #1 Prolene stitches around its superior edge about every 5 cm = 12 total.  I rolled the mesh & placed into the peritoneal cavity through the 10 cm fascial defect.  I unrolled  the mesh and positioned it appropriately.  I secured the mesh to cover up the hernia defect using a laparoscopic suture passer to pass the tails of the Prolene through the abdominal wall & tagged them with clamps.  I started out in four corners to make sure  I had the mesh centered over the hernia defect appropriately, and then proceeded to work in quadrants.  We evacuated CO2 & desufflated the abdomen.  I tied the fascial stitches down.  I reinsufflated the abdomen.  The mesh provided at least 5-10 cm circumferential coverage around the entire region of hernia defects.   I tacked the edges & central part of the mesh to the peritoneum/posterior rectus fascia with SecureStrap (that poorly fired) & then ProTAck metal spiral tacks.   Hemostasis was excellent.  I closed the fascia port site on the 10 mm port in the LUQ using a 0 Vicryl stitch using laparoscopic intracorporeal suture passer. I then placed On-Q catheter sheaths in the preperitoneal plane under direct laparoscopic guidance from the subxiphoid region down to the posterior iliac crests in the lower flanks. I did reinspection. Hemostasis was good. Mesh laid well. Capnoperitoneum was evacuated. Ports were removed. The skin was closed with Monocryl at the port sites and Steri-Strips on the fascial stitch puncture sites. OnQ catheters were placed and the sheathes peeled away. On-Q pump was secured. Patient is being extubated to go to the recovery room. I'm about to discuss operative findings with the patient's family.

## 2013-02-04 NOTE — Interval H&P Note (Signed)
History and Physical Interval Note:  02/04/2013 9:06 AM  Tina [name]  has presented today for surgery, with the diagnosis of RIGHT LOWER QUADRANT ABDOMINAL PAIN   The various methods of treatment have been discussed with the patient and family. After consideration of risks, benefits and other options for treatment, the patient has consented to  Procedure(s): LAPAROSCOPIC ventral wall hernia repair LAPAROSCOPIC LYSIS OF ADHESIONS laparoscopic exploration of abdomen  (N/A) INSERTION OF MESH (N/A) as a surgical intervention .  The patient's history has been reviewed, patient examined, no change in status, stable for surgery.  I have reviewed the patient's chart and labs.  Questions were answered to the patient's satisfaction.     [name] C.

## 2013-02-04 NOTE — Anesthesia Preprocedure Evaluation (Addendum)
Anesthesia Evaluation  Patient identified by MRN, date of birth, ID band Patient awake    Reviewed: Allergy & Precautions, H&P , NPO status , Patient's Chart, lab work & pertinent test results  Airway Mallampati: II TM Distance: >3 FB Neck ROM: Full    Dental no notable dental hx. (+) Teeth Intact and Dental Advisory Given   Pulmonary neg pulmonary ROS,  breath sounds clear to auscultation  Pulmonary exam normal       Cardiovascular hypertension, Pt. on medications and Pt. on home beta blockers negative cardio ROS  Rhythm:Regular Rate:Normal     Neuro/Psych PSYCHIATRIC DISORDERS Anxiety Depression Panic disorderHx of spastic hemiplegia  Neuromuscular disease CVA (r sided weakness), Residual Symptoms negative neurological ROS  negative psych ROS   GI/Hepatic negative GI ROS, Neg liver ROS,   Endo/Other  negative endocrine ROSdiabetes, Type 1, Insulin DependentMorbid obesity  Renal/GU negative Renal ROS  negative genitourinary   Musculoskeletal negative musculoskeletal ROS (+)   Abdominal   Peds negative pediatric ROS (+)  Hematology negative hematology ROS (+) anemia ,   Anesthesia Other Findings   Reproductive/Obstetrics negative OB ROS                          Anesthesia Physical Anesthesia Plan  ASA: III  Anesthesia Plan: General   Post-op Pain Management:    Induction: Intravenous  Airway Management Planned: Oral ETT  Additional Equipment:   Intra-op Plan:   Post-operative Plan: Extubation in OR  Informed Consent: I have reviewed the patients History and Physical, chart, labs and discussed the procedure including the risks, benefits and alternatives for the proposed anesthesia with the patient or authorized representative who has indicated his/her understanding and acceptance.   Dental advisory given  Plan Discussed with: CRNA  Anesthesia Plan Comments: (Note "allergy" to  Versed.)        Anesthesia Quick Evaluation

## 2013-02-05 LAB — HEMOGLOBIN A1C
Hgb A1c MFr Bld: 8.7 % — ABNORMAL HIGH (ref <5.7)
MEAN PLASMA GLUCOSE: 203 mg/dL — AB (ref <117)

## 2013-02-05 LAB — GLUCOSE, CAPILLARY
GLUCOSE-CAPILLARY: 240 mg/dL — AB (ref 70–99)
Glucose-Capillary: 205 mg/dL — ABNORMAL HIGH (ref 70–99)
Glucose-Capillary: 358 mg/dL — ABNORMAL HIGH (ref 70–99)
Glucose-Capillary: 222 mg/dL — ABNORMAL HIGH (ref 70–99)
Glucose-Capillary: 272 mg/dL — ABNORMAL HIGH (ref 70–99)
Glucose-Capillary: 234 mg/dL — ABNORMAL HIGH (ref 70–99)

## 2013-02-05 NOTE — Progress Notes (Signed)
Patient ID: Tina [name], female   DOB: 05/15/1970, 43 y.o.   MRN: [id number]  [facility] - Progress Note  POD# 1  Subjective: Patient complains of pain.  Ambulated briefly.  Taking limited po diet.  Objective: Vital signs in last 24 hours: Temp:  [97.6 F (36.4 C)-99.2 F (37.3 C)] 98.7 F (37.1 C) (01/24 0512) Pulse Rate:  [67-87] 82 (01/24 0512) Resp:  [16-24] 16 (01/24 0512) BP: (100-132)/(50-67) 110/54 mmHg (01/24 0512) SpO2:  [95 %-100 %] 95 % (01/24 0512) Weight:  [238 lb (107.956 kg)] 238 lb (107.956 kg) (01/23 1019) Last BM Date: 02/04/13  Intake/Output from previous day: 01/23 0701 - 01/24 0700 In: 2580 [P.O.:480; I.V.:2100] Out: 1700 [Urine:1600; Blood:100]  Exam: HEENT - clear, not icteric Neck - soft Chest - clear bilaterally Cor - RRR, no murmur Abd - binder on; BS present; dressings dry and intact Ext - no significant edema Neuro - grossly intact, no focal deficits  Lab Results:  No results found for this basename: WBC, HGB, HCT, PLT,  in the last 72 hours  No results found for this basename: NA, K, CL, CO2, GLUCOSE, BUN, CREATININE, CALCIUM,  in the last 72 hours  Studies/Results: No results found.  Assessment / Plan: 1.  Status post lap ventral incisional hernia repair with mesh, lysis of adhesions  Encouraged OOB, ambulation  Would go slow on diet  Leave Foley in place until tomorrow  Pain Rx   Earnstine Regal, MD, [facility] Office: [phone]  02/05/2013

## 2013-02-06 LAB — GLUCOSE, CAPILLARY
Glucose-Capillary: 200 mg/dL — ABNORMAL HIGH (ref 70–99)
Glucose-Capillary: 166 mg/dL — ABNORMAL HIGH (ref 70–99)
Glucose-Capillary: 132 mg/dL — ABNORMAL HIGH (ref 70–99)
Glucose-Capillary: 169 mg/dL — ABNORMAL HIGH (ref 70–99)

## 2013-02-06 MED ORDER — OXYCODONE-ACETAMINOPHEN 5-325 MG PO TABS
1.0000 | ORAL_TABLET | ORAL | Status: DC | PRN
  Administered 2013-02-06 – 2013-02-07 (×2): 2 via ORAL
  Filled 2013-02-06 (×2): qty 2

## 2013-02-06 NOTE — Progress Notes (Signed)
Patient ID: Tina [name], female   DOB: Jun 05, 1970, 43 y.o.   MRN: [id number]  [facility] - Progress Note  POD# 2  Subjective: Patient up in rocking chair.  Ambulated.  No flatus or BM yet.  Pain better.  Taking mainly clear liquids.  Objective: Vital signs in last 24 hours: Temp:  [98 F (36.7 C)-99.7 F (37.6 C)] 98.7 F (37.1 C) (01/25 0458) Pulse Rate:  [57-74] 66 (01/25 1044) Resp:  [16-18] 16 (01/25 0458) BP: (92-116)/(46-59) 110/57 mmHg (01/25 1044) SpO2:  [95 %-99 %] 96 % (01/25 0458) Last BM Date: 02/04/13  Intake/Output from previous day: 01/24 0701 - 01/25 0700 In: 2360 [P.O.:960; I.V.:1400] Out: 3775 [Urine:3775]  Exam: HEENT - clear, not icteric Neck - soft Chest - clear bilaterally Cor - RRR, no murmur Abd - binder on; wounds clear and dry; BS present Ext - no significant edema Neuro - grossly intact, no focal deficits  Lab Results:  No results found for this basename: WBC, HGB, HCT, PLT,  in the last 72 hours  No results found for this basename: NA, K, CL, CO2, GLUCOSE, BUN, CREATININE, CALCIUM,  in the last 72 hours  Studies/Results: No results found.  Assessment / Plan: 1. Status post lap ventral incisional hernia repair with mesh, lysis of adhesions   Encouraged OOB, ambulation   Slowly advance diet  Discontinue Foley today   PO pain Rx   Home 1-2 days  Earnstine Regal, MD, [facility] Office: [phone]  02/06/2013

## 2013-02-07 ENCOUNTER — Encounter (HOSPITAL_COMMUNITY): Payer: Self-pay | Admitting: Surgery

## 2013-02-07 DIAGNOSIS — R209 Unspecified disturbances of skin sensation: Secondary | ICD-10-CM

## 2013-02-07 DIAGNOSIS — E119 Type 2 diabetes mellitus without complications: Secondary | ICD-10-CM

## 2013-02-07 LAB — CULTURE, ROUTINE-ABSCESS: Culture: NO GROWTH

## 2013-02-07 LAB — GLUCOSE, CAPILLARY
GLUCOSE-CAPILLARY: 116 mg/dL — AB (ref 70–99)
GLUCOSE-CAPILLARY: 180 mg/dL — AB (ref 70–99)
Glucose-Capillary: 116 mg/dL — ABNORMAL HIGH (ref 70–99)
Glucose-Capillary: 160 mg/dL — ABNORMAL HIGH (ref 70–99)

## 2013-02-07 MED ORDER — BUPIVACAINE 0.25 % ON-Q PUMP DUAL CATH 300 ML
300.0000 mL | INJECTION | Status: DC
  Filled 2013-02-07 (×2): qty 300

## 2013-02-07 MED ORDER — ACETAMINOPHEN 500 MG PO TABS
1000.0000 mg | ORAL_TABLET | Freq: Three times a day (TID) | ORAL | Status: DC
  Administered 2013-02-07 – 2013-02-09 (×6): 1000 mg via ORAL
  Filled 2013-02-07 (×11): qty 2

## 2013-02-07 MED ORDER — POLYETHYLENE GLYCOL 3350 17 G PO PACK
17.0000 g | PACK | Freq: Two times a day (BID) | ORAL | Status: DC
  Administered 2013-02-07 – 2013-02-08 (×3): 17 g via ORAL
  Filled 2013-02-07 (×6): qty 1

## 2013-02-07 MED ORDER — TRAMADOL HCL 50 MG PO TABS
100.0000 mg | ORAL_TABLET | Freq: Three times a day (TID) | ORAL | Status: DC
  Administered 2013-02-07: 100 mg via ORAL
  Filled 2013-02-07: qty 2

## 2013-02-07 MED ORDER — GABAPENTIN 400 MG PO CAPS
400.0000 mg | ORAL_CAPSULE | Freq: Three times a day (TID) | ORAL | Status: DC
  Administered 2013-02-07 – 2013-02-09 (×6): 400 mg via ORAL
  Filled 2013-02-07 (×8): qty 1

## 2013-02-07 MED ORDER — SODIUM CHLORIDE 0.9 % IJ SOLN: 3.0000 mL | INTRAMUSCULAR | Status: DC | PRN

## 2013-02-07 MED ORDER — ACETAMINOPHEN 500 MG PO TABS
1000.0000 mg | ORAL_TABLET | Freq: Three times a day (TID) | ORAL | Status: DC
  Administered 2013-02-07: 1000 mg via ORAL
  Filled 2013-02-07: qty 2

## 2013-02-07 MED ORDER — LACTATED RINGERS IV BOLUS (SEPSIS): 1000.0000 mL | Freq: Three times a day (TID) | INTRAVENOUS | Status: AC | PRN

## 2013-02-07 MED ORDER — OXYCODONE HCL 5 MG PO TABS
5.0000 mg | ORAL_TABLET | ORAL | Status: DC | PRN
  Administered 2013-02-07 (×2): 10 mg via ORAL
  Filled 2013-02-07 (×3): qty 2

## 2013-02-07 MED ORDER — SODIUM CHLORIDE 0.9 % IJ SOLN: 3.0000 mL | Freq: Two times a day (BID) | INTRAMUSCULAR | Status: DC

## 2013-02-07 MED ORDER — BISACODYL 10 MG RE SUPP
10.0000 mg | Freq: Every day | RECTAL | Status: DC
  Administered 2013-02-07: 10 mg via RECTAL
  Filled 2013-02-07 (×2): qty 1

## 2013-02-07 NOTE — Progress Notes (Signed)
UR completed. Patient changed to inpatient- slow to progress and requiring IV pain medication.

## 2013-02-07 NOTE — Progress Notes (Signed)
Pt had steri-strips fall off after taken her shower. Steri-strips where replaced as well as others being reinforced on the the Patients lower abdomen on the right lateral side and left lateral side. ABD pad was placed on patients right lower lateral side to help control drainage. Pt is also waiting on a new abdominal binder to come up that has been ordered.

## 2013-02-07 NOTE — Progress Notes (Signed)
Changed order to sips of clears.

## 2013-02-07 NOTE — Progress Notes (Addendum)
Tina [name] [id number] July 01, 1970  CARE TEAM:  PCP: Dwan Bolt, MD  Outpatient Care Team: Patient Care Team: Anda Kraft, MD as PCP - General Sheronette Clint Bolder, MD as Consulting Physician (Obstetrics and Gynecology) Garvin Fila, MD as Consulting Physician (Neurology) [name] Craver, MD as Consulting Physician (Gastroenterology)  Inpatient Treatment Team: Treatment Team: Attending Provider: Adin Hector, MD; Technician: Leda Quail, NT; Registered Nurse: Emeterio Reeve, RN; Registered Nurse: Kristopher Oppenheim, RN   Subjective:  Sore.  IV meds needed.  Trying to control with narcotic pills.  IV infiltrated  Nauseated last night.  Better this morning.  Tolerating full liquids.  Uncomfortable to get out of bed/chair.  Objective:  Vital signs:  Filed Vitals:   02/06/13 1332 02/06/13 2125 02/07/13 0500 02/07/13 1016  BP: 124/77 142/72 124/79 116/75  Pulse: 69 67 68 63  Temp: 98.3 F (36.8 C) 98.1 F (36.7 C) 97.7 F (36.5 C) 98.2 F (36.8 C)  TempSrc: Oral Oral Oral Oral  Resp: 16 16 16 18   Height:      Weight:      SpO2: 99% 96% 96% 95%    Last BM Date: 02/04/13  Intake/Output   Yesterday:  01/25 0701 - 01/26 0700 In: 2045.5 [P.O.:840; I.V.:1205.5] Out: 1950 [Urine:1950] This shift:  Total I/O In: 780 [P.O.:780] Out: 750 [Urine:750]  Bowel function:  Flatus: n  BM: n  Drain: n/a  Physical Exam:  General: Pt awake/alert/oriented x4 in no acute distress Eyes: PERRL, normal EOM.  Sclera clear.  No icterus Neuro: CN II-XII intact w/o focal sensory/motor deficits. Lymph: No head/neck/groin lymphadenopathy Psych:  No delerium/psychosis/paranoia HENT: Normocephalic, Mucus membranes moist.  No thrush Neck: Supple, No tracheal deviation Chest: No chest wall pain w good excursion CV:  Pulses intact.  Regular rhythm MS: Normal AROM mjr joints.  No obvious deformity Abdomen: Soft.  Nondistended.  MOd tender at incisions  only.  No evidence of peritonitis.  No incarcerated hernias. Ext:  SCDs BLE.  No mjr edema.  No cyanosis Skin: No petechiae / purpura   Problem List:   Principal Problem:   Abdominal pain, RLQ (right lower quadrant), possible recurrent incisional hernia Active Problems:   Paresthesias   Obesity (BMI 30-39.9)   Diabetes mellitus type II, uncontrolled   History of CVA (cerebrovascular accident)   HTN (hypertension)   Constipation, chronic   Ventral hernia   Assessment  Tina [name]  43 y.o. female  3 Days Post-Op  Procedure(s): LAPAROSCOPIC ventral wall hernia repair LAPAROSCOPIC LYSIS OF ADHESIONS laparoscopic exploration of abdomen  INSERTION OF MESH  Struggling with soreness and morbidly obese female with recurrent large hernia  Plan:  Replaced IV.  I strongly suspect she will continue to need IV pain medications.  Bowel regimen.  Keep on [place] liquids for now until flus.  Cultures negative so far.  Follow.  -DM control   -HTN control  Refill bupivacaine painball pump OnQ.  Increase Tylenol.  Continue Binder/heat/icepack  -VTE prophylaxis- SCDs, etc  -mobilize as tolerated to help recovery  D/C patient from hospital when patient meets criteria (anticipate in 1-2 day(s)):  Tolerating oral intake well Ambulating in walkways Adequate pain control without IV medications Urinating  Having flatus   Adin Hector, M.D., F.A.C.S. Gastrointestinal and Minimally Invasive Surgery [facility] [address], [place], [place] [address] [phone] Main / Paging   02/07/2013   Results:   Labs: Results for orders placed during the  hospital encounter of 02/04/13 (from the past 48 hour(s))  GLUCOSE, CAPILLARY     Status: Abnormal   Collection Time    02/05/13  4:46 PM      Result Value Range   Glucose-Capillary 272 (*) 70 - 99 mg/dL  GLUCOSE, CAPILLARY     Status: Abnormal   Collection Time    02/05/13  9:13 PM       Result Value Range   Glucose-Capillary 234 (*) 70 - 99 mg/dL   Comment 1 Notify RN    GLUCOSE, CAPILLARY     Status: Abnormal   Collection Time    02/06/13  7:19 AM      Result Value Range   Glucose-Capillary 200 (*) 70 - 99 mg/dL  GLUCOSE, CAPILLARY     Status: Abnormal   Collection Time    02/06/13 11:57 AM      Result Value Range   Glucose-Capillary 166 (*) 70 - 99 mg/dL  GLUCOSE, CAPILLARY     Status: Abnormal   Collection Time    02/06/13  5:32 PM      Result Value Range   Glucose-Capillary 132 (*) 70 - 99 mg/dL  GLUCOSE, CAPILLARY     Status: Abnormal   Collection Time    02/06/13  9:21 PM      Result Value Range   Glucose-Capillary 169 (*) 70 - 99 mg/dL   Comment 1 Notify RN    GLUCOSE, CAPILLARY     Status: Abnormal   Collection Time    02/07/13  7:09 AM      Result Value Range   Glucose-Capillary 116 (*) 70 - 99 mg/dL  GLUCOSE, CAPILLARY     Status: Abnormal   Collection Time    02/07/13 11:42 AM      Result Value Range   Glucose-Capillary 116 (*) 70 - 99 mg/dL    Imaging / Studies: No results found.  Medications / Allergies: per chart  Antibiotics: Anti-infectives   Start     Dose/Rate Route Frequency Ordered Stop   02/04/13 2000  ceFAZolin (ANCEF) IVPB 1 g/50 mL premix     1 g 100 mL/hr over 30 Minutes Intravenous Every 6 hours 02/04/13 1550 02/05/13 0836   02/04/13 1115  clindamycin (CLEOCIN) 900 mg, gentamicin (GARAMYCIN) 240 mg in sodium chloride 0.9 % 1,000 mL for intraperitoneal lavage      Intraperitoneal To Surgery 02/04/13 1107 02/04/13 1354   02/04/13 0710  ceFAZolin (ANCEF) IVPB 2 g/50 mL premix     2 g 100 mL/hr over 30 Minutes Intravenous On call to O.R. 02/04/13 0710 02/04/13 1410   02/04/13 0708  metroNIDAZOLE (FLAGYL) IVPB 500 mg    Comments:  Pharmacy may adjust dosing strength, interval, or rate of medication as needed for optimal therapy for the patient Send with patient on call to the OR.  Anesthesia to complete antibiotic  administration <33min prior to incision per [facility].   500 mg 100 mL/hr over 60 Minutes Intravenous On call to O.R. 02/04/13 0708 02/04/13 1018       Note: This dictation was prepared with Dragon/digital dictation along with [employer]. Any transcriptional errors that result from this process are unintentional.

## 2013-02-07 NOTE — Progress Notes (Signed)
Pt c/o nausea. Abd distended, has not passed gas yet. Changed pt's diet back to ice chips due to previous MD order.

## 2013-02-08 LAB — GLUCOSE, CAPILLARY
GLUCOSE-CAPILLARY: 80 mg/dL (ref 70–99)
GLUCOSE-CAPILLARY: 222 mg/dL — AB (ref 70–99)
Glucose-Capillary: 151 mg/dL — ABNORMAL HIGH (ref 70–99)
Glucose-Capillary: 145 mg/dL — ABNORMAL HIGH (ref 70–99)
Glucose-Capillary: 188 mg/dL — ABNORMAL HIGH (ref 70–99)

## 2013-02-08 MED ORDER — INSULIN GLARGINE 100 UNIT/ML ~~LOC~~ SOLN
25.0000 [IU] | Freq: Two times a day (BID) | SUBCUTANEOUS | Status: DC
  Administered 2013-02-08 – 2013-02-09 (×3): 25 [IU] via SUBCUTANEOUS
  Filled 2013-02-08 (×4): qty 0.25

## 2013-02-08 MED ORDER — OXYCODONE HCL 5 MG PO TABS
10.0000 mg | ORAL_TABLET | ORAL | Status: DC | PRN
  Administered 2013-02-08: 10 mg via ORAL
  Administered 2013-02-08: 15 mg via ORAL
  Administered 2013-02-08: 20 mg via ORAL
  Administered 2013-02-09: 15 mg via ORAL
  Filled 2013-02-08 (×2): qty 3
  Filled 2013-02-08: qty 4

## 2013-02-08 MED ORDER — TIZANIDINE HCL 4 MG PO TABS
8.0000 mg | ORAL_TABLET | Freq: Two times a day (BID) | ORAL | Status: DC
  Administered 2013-02-08 (×2): 8 mg via ORAL
  Filled 2013-02-08 (×4): qty 2

## 2013-02-08 NOTE — Progress Notes (Signed)
Tina [name] [id number] [birth year]  CARE TEAM:  PCP: Dwan Bolt, MD  Outpatient Care Team: Patient Care Team: Anda Kraft, MD as PCP - General Sheronette Clint Bolder, MD as Consulting Physician (Obstetrics and Gynecology) Garvin Fila, MD as Consulting Physician (Neurology) [name] Craver, MD as Consulting Physician (Gastroenterology)  Inpatient Treatment Team: Treatment Team: Attending Provider: Adin Hector, MD; Technician: Leda Quail, NT; Registered Nurse: Emeterio Reeve, RN; Registered Nurse: Kristopher Oppenheim, RN; Registered Nurse: Celedonio Savage, RN   Subjective:  Sore.  IV meds needed.  Trying to control with narcotic pills.  IV infiltrated  BMs last night.  Tolerating full liquids.  Uncomfortable to get out of bed/chair.  Objective:  Vital signs:  Filed Vitals:   02/07/13 1400 02/07/13 1700 02/07/13 2220 02/08/13 0544  BP: 118/61 142/85 119/76 141/84  Pulse: 67 62 65 71  Temp: 98.9 F (37.2 C) 97.4 F (36.3 C) 97.8 F (36.6 C) 97.9 F (36.6 C)  TempSrc: Oral Oral Oral Oral  Resp: 18 18 18 18   Height:      Weight:      SpO2: 99% 100% 95% 96%    Last BM Date: 02/04/13  Intake/Output   Yesterday:  01/26 0701 - 01/27 0700 In: 2275 [P.O.:2275] Out: 5277 [Urine:4450; Stool:3] This shift:     Bowel function:  Flatus: Y  BM: Y  Drain: n/a  Physical Exam:  General: Pt awake/alert/oriented x4 in no moderate distress Eyes: PERRL, normal EOM.  Sclera clear.  No icterus Neuro: CN II-XII intact w/o focal sensory/motor deficits. Lymph: No head/neck/groin lymphadenopathy Psych:  No delerium/psychosis/paranoia HENT: Normocephalic, Mucus membranes moist.  No thrush Neck: Supple, No tracheal deviation Chest: No chest wall pain w good excursion CV:  Pulses intact.  Regular rhythm MS: Normal AROM mjr joints.  No obvious deformity Abdomen: Soft.  Nondistended.  Moderately tender at incisions only.  No evidence of  peritonitis.  No incarcerated hernias. Ext:  SCDs BLE.  No mjr edema.  No cyanosis Skin: No petechiae / purpura   Problem List:   Principal Problem:   Abdominal pain, RLQ (right lower quadrant), possible recurrent incisional hernia Active Problems:   Paresthesias   Obesity (BMI 30-39.9)   Diabetes mellitus type II, uncontrolled   History of CVA (cerebrovascular accident)   HTN (hypertension)   Constipation, chronic   Ventral hernia   Assessment  Tina [name]  43 y.o. female  4 Days Post-Op  Procedure(s): LAPAROSCOPIC ventral wall hernia repair LAPAROSCOPIC LYSIS OF ADHESIONS laparoscopic exploration of abdomen  INSERTION OF MESH  Struggling with soreness and morbidly obese female with recurrent large hernia  Plan:  Inc pain control.  Refill bupivacaine painball pump OnQ.  Tylenol QID.  Increase tizanidine.  Inc oxycodone dose.  Continue Binder/heat/icepack  Bowel regimen.  Adv diet to solids.  Cultures negative so far.  Follow.  DM control. Inc lantus  HTN controlVTE prophylaxis- SCDs, etc  mobilize as tolerated to help recovery  D/C patient from hospital when patient meets criteria (anticipate in 1-2 day(s)):  Tolerating oral intake well Ambulating in walkways Adequate pain control without IV medications Urinating  Having flatus   Adin Hector, M.D., F.A.C.S. Gastrointestinal and Minimally Invasive Surgery [facility] [address] [place], [place] [address] [phone] Main / Paging   02/08/2013   Results:   Labs: Results for orders placed during the hospital encounter of 02/04/13 (from the past 48 hour(s))  GLUCOSE, CAPILLARY  Status: Abnormal   Collection Time    02/06/13 11:57 AM      Result Value Range   Glucose-Capillary 166 (*) 70 - 99 mg/dL  GLUCOSE, CAPILLARY     Status: Abnormal   Collection Time    02/06/13  5:32 PM      Result Value Range   Glucose-Capillary 132 (*) 70 - 99 mg/dL   GLUCOSE, CAPILLARY     Status: Abnormal   Collection Time    02/06/13  9:21 PM      Result Value Range   Glucose-Capillary 169 (*) 70 - 99 mg/dL   Comment 1 Notify RN    GLUCOSE, CAPILLARY     Status: Abnormal   Collection Time    02/07/13  7:09 AM      Result Value Range   Glucose-Capillary 116 (*) 70 - 99 mg/dL  GLUCOSE, CAPILLARY     Status: Abnormal   Collection Time    02/07/13 11:42 AM      Result Value Range   Glucose-Capillary 116 (*) 70 - 99 mg/dL  GLUCOSE, CAPILLARY     Status: Abnormal   Collection Time    02/07/13  4:55 PM      Result Value Range   Glucose-Capillary 160 (*) 70 - 99 mg/dL  GLUCOSE, CAPILLARY     Status: Abnormal   Collection Time    02/07/13 10:02 PM      Result Value Range   Glucose-Capillary 180 (*) 70 - 99 mg/dL    Imaging / Studies: No results found.  Medications / Allergies: per chart  Antibiotics: Anti-infectives   Start     Dose/Rate Route Frequency Ordered Stop   02/04/13 2000  ceFAZolin (ANCEF) IVPB 1 g/50 mL premix     1 g 100 mL/hr over 30 Minutes Intravenous Every 6 hours 02/04/13 1550 02/05/13 0836   02/04/13 1115  clindamycin (CLEOCIN) 900 mg, gentamicin (GARAMYCIN) 240 mg in sodium chloride 0.9 % 1,000 mL for intraperitoneal lavage      Intraperitoneal To Surgery 02/04/13 1107 02/04/13 1354   02/04/13 0710  ceFAZolin (ANCEF) IVPB 2 g/50 mL premix     2 g 100 mL/hr over 30 Minutes Intravenous On call to O.R. 02/04/13 0710 02/04/13 1410   02/04/13 0708  metroNIDAZOLE (FLAGYL) IVPB 500 mg    Comments:  Pharmacy may adjust dosing strength, interval, or rate of medication as needed for optimal therapy for the patient Send with patient on call to the OR.  Anesthesia to complete antibiotic administration <84min prior to incision per [facility].   500 mg 100 mL/hr over 60 Minutes Intravenous On call to O.R. 02/04/13 0708 02/04/13 1018       Note: This dictation was prepared with Dragon/digital dictation along with  [employer]. Any transcriptional errors that result from this process are unintentional.

## 2013-02-09 LAB — ANAEROBIC CULTURE

## 2013-02-09 LAB — GLUCOSE, CAPILLARY: Glucose-Capillary: 139 mg/dL — ABNORMAL HIGH (ref 70–99)

## 2013-02-09 NOTE — Discharge Summary (Signed)
Physician Discharge Summary  Patient ID: Tina [name] MRN: [id number] DOB/AGE: 44-May-1972 44 y.o.  Admit date: 02/04/2013 Discharge date: 02/09/2013  Admission Diagnoses:  Discharge Diagnoses:  Principal Problem:   Recurrent ventral incisional hernia s/p closure/repair w mesh 02/04/2013 Active Problems:   Paresthesias   Obesity (BMI 30-39.9)   Diabetes mellitus type II, uncontrolled   History of CVA (cerebrovascular accident)   HTN (hypertension)   Constipation, chronic   Ventral hernia   Discharged Condition: good  Hospital Course: Morbidly obese diabetic female with recurrent incisional hernia.  Underwent laparoscopic repair the day of admission.  She struggled with postoperative pain given her chronic pain requirements.  Had a mild ileus.  However began to advance on her diet and have flatus and bowel movements.  Pain medicines adjusted.  Postoperatively, the patient mobilized in the hallways and advanced to a solid diet gradually.  Pain was well-controlled and transitioned off IV medications.    By the time of discharge, the patient was walking well the hallways, eating food well, having flatus.  Pain was-controlled on an oral regimen.  Based on meeting DC criteria and recovering well, I felt it was safe for the patient to be discharged home with close followup.  Instructions were discussed in detail.  They are written as well.  Consults: None  Significant Diagnostic Studies:   Results for orders placed during the hospital encounter of 02/04/13 (from the past 72 hour(s))  GLUCOSE, CAPILLARY     Status: Abnormal   Collection Time    02/07/13 11:42 AM      Result Value Range   Glucose-Capillary 116 (*) 70 - 99 mg/dL  GLUCOSE, CAPILLARY     Status: Abnormal   Collection Time    02/07/13  4:55 PM      Result Value Range   Glucose-Capillary 160 (*) 70 - 99 mg/dL  GLUCOSE, CAPILLARY     Status: Abnormal   Collection Time    02/07/13 10:02 PM      Result Value Range   Glucose-Capillary 180 (*) 70 - 99 mg/dL  GLUCOSE, CAPILLARY     Status: None   Collection Time    02/08/13  7:35 AM      Result Value Range   Glucose-Capillary 80  70 - 99 mg/dL  GLUCOSE, CAPILLARY     Status: Abnormal   Collection Time    02/08/13 11:51 AM      Result Value Range   Glucose-Capillary 151 (*) 70 - 99 mg/dL  GLUCOSE, CAPILLARY     Status: Abnormal   Collection Time    02/08/13  5:37 PM      Result Value Range   Glucose-Capillary 145 (*) 70 - 99 mg/dL  GLUCOSE, CAPILLARY     Status: Abnormal   Collection Time    02/08/13  7:49 PM      Result Value Range   Glucose-Capillary 222 (*) 70 - 99 mg/dL  GLUCOSE, CAPILLARY     Status: Abnormal   Collection Time    02/08/13 10:04 PM      Result Value Range   Glucose-Capillary 188 (*) 70 - 99 mg/dL  GLUCOSE, CAPILLARY     Status: Abnormal   Collection Time    02/09/13  7:43 AM      Result Value Range   Glucose-Capillary 139 (*) 70 - 99 mg/dL     Treatments:   POST-OPERATIVE DIAGNOSIS: Recurrent ventral wall incisional hernia   PROCEDURE: Procedure(s):  LAPAROSCOPIC LYSIS OF ADHESIONS 74min (  1/2 case)  LAPAROSCOPIC ventral wall hernia repair  INSERTION OF MESH   SURGEON: Adin Hector, MD   ASSISTANT: RN    Discharge Exam: Blood pressure 102/69, pulse 70, temperature 97.6 F (36.4 C), temperature source Oral, resp. rate 17, height 5\' 6"  (1.676 m), weight 238 lb (107.956 kg), last menstrual period 01/09/2013, SpO2 99.00%.  General: Pt awake/alert/oriented x4 in no major acute distress Eyes: PERRL, normal EOM. Sclera nonicteric Neuro: CN II-XII intact w/o focal sensory/motor deficits. Lymph: No head/neck/groin lymphadenopathy Psych:  No delerium/psychosis/paranoia HENT: Normocephalic, Mucus membranes moist.  No thrush Neck: Supple, No tracheal deviation Chest: No pain.  Good respiratory excursion. CV:  Pulses intact.  Regular rhythm MS: Normal AROM mjr joints.  No obvious deformity Abdomen: Soft,  Nondistended.  Appropriate TTP at incisions.  No incarcerated hernias. Ext:  SCDs BLE.  No significant edema.  No cyanosis Skin: No petechiae / purpura   Disposition: 01-Home or Self Care  Discharge Orders   Future Appointments Provider Department Dept Phone   02/22/2013 9:00 AM [name][facility] [phone]   02/23/2013 9:30 AM Adin Hector, MD [facility], [place] [phone]   Future Orders Complete By Expires   Call MD for:  extreme fatigue  As directed    Call MD for:  extreme fatigue  As directed    Call MD for:  hives  As directed    Call MD for:  hives  As directed    Call MD for:  persistant nausea and vomiting  As directed    Call MD for:  persistant nausea and vomiting  As directed    Call MD for:  redness, tenderness, or signs of infection (pain, swelling, redness, odor or green/yellow discharge around incision site)  As directed    Call MD for:  redness, tenderness, or signs of infection (pain, swelling, redness, odor or green/yellow discharge around incision site)  As directed    Call MD for:  severe uncontrolled pain  As directed    Call MD for:  severe uncontrolled pain  As directed    Call MD for:  As directed    Comments:     Temperature > 101.26F   Call MD for:  As directed    Comments:     Temperature > 101.26F   Diet - low sodium heart healthy  As directed    Diet - low sodium heart healthy  As directed    Discharge instructions  As directed    Comments:     Please see discharge instruction sheets.  Also refer to handout given an office.  Please call our office if you have any questions or concerns [phone]   Discharge instructions  As directed    Comments:     Please see discharge instruction sheets.  Also refer to handout given an office.  Please call our office if you have any questions or concerns [phone]   Discharge wound care:  As directed    Comments:     If you have closed incisions, shower  and bathe over these incisions with soap and water every day.  Remove all surgical dressings on postoperative day #3.  You do not need to replace dressings over the closed incisions unless you feel more comfortable with a Band-Aid covering it.   If you have an open wound that requires packing, please see wound care instructions.  In general, remove all dressings, wash wound with soap and  water and then replace with saline moistened gauze.  Do the dressing change at least every day.  Please call our office [phone] if you have further questions.   Discharge wound care:  As directed    Comments:     You have closed incisions, shower and bathe over these incisions with soap and water every day.  Remove all surgical dressings on 02/10/2013 Thursday including the OnQ brown plastic catheters, skin tape Steristrips, and clear Tegaderm band-aids.  You do not need to replace dressings over the closed incisions unless you feel more comfortable with a Band-Aid covering it.   If you have an open wound that requires packing, please see wound care instructions.  In general, remove all dressings, wash wound with soap and water and then replace with saline moistened gauze.  Do the dressing change at least every day.  Please call our office [phone] if you have further questions.   Driving Restrictions  As directed    Comments:     No driving until off narcotics and can safely swerve away without pain during an emergency   Driving Restrictions  As directed    Comments:     No driving until off narcotics and can safely swerve away without pain during an emergency   Increase activity slowly  As directed    Comments:     Walk an hour a day.  Use 20-30 minute walks.  When you can walk 30 minutes without difficulty, increase to low impact/moderate activities such as biking, jogging, swimming, sexual activity..  Eventually can increase to unrestricted activity when not feeling pain.  If you feel pain: STOP!Marland Kitchen   Let  pain protect you from overdoing it.  Use ice/heat/over-the-counter pain medications to help minimize his soreness.  Use pain prescriptions as needed to remain active.  It is better to take extra pain medications and be more active than to stay bedridden to avoid all pain medications.   Increase activity slowly  As directed    Comments:     Walk an hour a day.  Use 20-30 minute walks.  When you can walk 30 minutes without difficulty, increase to low impact/moderate activities such as biking, jogging, swimming, sexual activity..  Eventually can increase to unrestricted activity when not feeling pain.  If you feel pain: STOP!Marland Kitchen   Let pain protect you from overdoing it.  Use ice/heat/over-the-counter pain medications to help minimize his soreness.  Use pain prescriptions as needed to remain active.  It is better to take extra pain medications and be more active than to stay bedridden to avoid all pain medications.   Lifting restrictions  As directed    Comments:     Avoid heavy lifting initially.  Do not push through pain.  You have no specific weight limit.  Coughing and sneezing or four more stressful to your incision than any lifting you will do. Pain will protect you from injury.  Therefore, avoid intense activity until off all narcotic pain medications.  Coughing and sneezing or four more stressful to your incision than any lifting he will do.   Lifting restrictions  As directed    Comments:     Avoid heavy lifting initially.  Do not push through pain.  You have no specific weight limit.  Coughing and sneezing or four more stressful to your incision than any lifting you will do. Pain will protect you from injury.  Therefore, avoid intense activity until off all narcotic pain medications.  Coughing and sneezing  or four more stressful to your incision than any lifting he will do.   May shower / Bathe  As directed    May shower / Bathe  As directed    May walk up steps  As directed    May walk up steps   As directed    Sexual Activity Restrictions  As directed    Comments:     Sexual activity as tolerated.  Do not push through pain.  Pain will protect you from injury.   Sexual Activity Restrictions  As directed    Comments:     Sexual activity as tolerated.  Do not push through pain.  Pain will protect you from injury.   Walk with assistance  As directed    Comments:     Walk over an hour a day.  May use a walker/cane/companion to help with balance and stamina.   Walk with assistance  As directed    Comments:     Walk over an hour a day.  May use a walker/cane/companion to help with balance and stamina.       Medication List    STOP taking these medications       traMADol 50 MG tablet  Commonly known as:  ULTRAM      TAKE these medications       Amlodipine-Valsartan-HCTZ 10-320-25 MG Tabs  Take 1 tablet by mouth every evening.     aspirin 325 MG tablet  Take 325 mg by mouth daily.     BYSTOLIC 10 MG tablet  Generic drug:  nebivolol  Take 10 mg by mouth every morning.     diclofenac sodium 1 % Gel  Commonly known as:  VOLTAREN  Apply 2 g topically 4 (four) times daily.     furosemide 40 MG tablet  Commonly known as:  LASIX  Take 20-40 mg by mouth daily as needed for fluid or edema.     gabapentin 300 MG capsule  Commonly known as:  NEURONTIN  Take 1 capsule (300 mg total) by mouth 3 (three) times daily.     insulin glargine 100 UNIT/ML injection  Commonly known as:  LANTUS  Inject 22 Units into the skin 2 (two) times daily.     insulin lispro 100 UNIT/ML injection  Commonly known as:  HUMALOG  Inject 10-15 Units into the skin 3 (three) times daily before meals.     INVOKANA 300 MG Tabs  Generic drug:  Canagliflozin  Take 300 mg by mouth every morning.     multivitamin with minerals Tabs tablet  Take 1 tablet by mouth daily.     oxyCODONE 5 MG immediate release tablet  Commonly known as:  Oxy IR/ROXICODONE  Take 1-2 tablets (5-10 mg total) by mouth every  4 (four) hours as needed for moderate pain, severe pain or breakthrough pain.     tiZANidine 4 MG tablet  Commonly known as:  ZANAFLEX  Take 4 mg by mouth 2 (two) times daily.           Follow-up Information   Follow up with Kaylyn Garrow C., MD. Schedule an appointment as soon as possible for a visit in 2 weeks.   Specialty:  General Surgery   Contact information:   [address] [place] [address] [phone]       Signed: Adin Hector. 02/09/2013, 7:23 AM

## 2013-02-09 NOTE — Progress Notes (Signed)
Discharge instructions reviewed with pt and family.  Pt verbalized understanding of discharge instructions.  Pt verbalized understanding and knowledge of removing On-Q pump as pt has had one in the past.  Pt verbalized understanding of follow up appt and new medications. No concerns voiced at discharge.

## 2013-02-09 NOTE — Plan of Care (Signed)
Problem: Phase III Progression Outcomes Goal: IV changed to normal saline lock Outcome: Progressing IV saline locked.  Goal: Nasogastric tube discontinued Outcome: Completed/Met Date Met:  02/09/13 No NGT present Goal: Discharge plan remains appropriate-arrangements made Outcome: Progressing Discharge plan in place.  Goal: Demonstrates TCDB, IS independently Outcome: Progressing Pt using IS independently.   Problem: Discharge Progression Outcomes Goal: Pain controlled with appropriate interventions Outcome: Progressing Pain controlled with PO pain medications  Goal: Tolerating diet Outcome: Progressing Pt tolerating diet with no nausea, vomiting, or discomfort.  Goal: Tubes and drains discontinued if indicated Outcome: Progressing On Q ball in place.

## 2013-02-11 ENCOUNTER — Telehealth (INDEPENDENT_AMBULATORY_CARE_PROVIDER_SITE_OTHER): Payer: Self-pay

## 2013-02-11 NOTE — Telephone Encounter (Signed)
Called pt to check on her after surgery. The pt is doing better she stated today and that the pain is under better control. The pt states the pain is getting a little less each day. I advised pt that if she has any questions or concerns to call our office. I asked if the pt removed the OnQue pump yet and she did without any problems. I reminded her of the f/u appt with Dr Johney Maine for 02/28/13. The pt understands.

## 2013-02-14 ENCOUNTER — Other Ambulatory Visit: Payer: Self-pay | Admitting: Physical Medicine & Rehabilitation

## 2013-02-15 ENCOUNTER — Encounter: Payer: Self-pay | Admitting: Physical Medicine & Rehabilitation

## 2013-02-15 ENCOUNTER — Encounter: Payer: Federal, State, Local not specified - PPO | Attending: Physical Medicine & Rehabilitation

## 2013-02-15 ENCOUNTER — Ambulatory Visit (HOSPITAL_BASED_OUTPATIENT_CLINIC_OR_DEPARTMENT_OTHER): Payer: Federal, State, Local not specified - PPO | Admitting: Physical Medicine & Rehabilitation

## 2013-02-15 ENCOUNTER — Other Ambulatory Visit: Payer: Self-pay | Admitting: Physical Medicine & Rehabilitation

## 2013-02-15 VITALS — BP 125/75 | HR 80 | Resp 14 | Ht 66.0 in | Wt 241.0 lb

## 2013-02-15 DIAGNOSIS — F410 Panic disorder [episodic paroxysmal anxiety] without agoraphobia: Secondary | ICD-10-CM | POA: Insufficient documentation

## 2013-02-15 DIAGNOSIS — G2581 Restless legs syndrome: Secondary | ICD-10-CM | POA: Insufficient documentation

## 2013-02-15 DIAGNOSIS — M76899 Other specified enthesopathies of unspecified lower limb, excluding foot: Secondary | ICD-10-CM | POA: Insufficient documentation

## 2013-02-15 DIAGNOSIS — F411 Generalized anxiety disorder: Secondary | ICD-10-CM | POA: Diagnosis not present

## 2013-02-15 DIAGNOSIS — I10 Essential (primary) hypertension: Secondary | ICD-10-CM | POA: Insufficient documentation

## 2013-02-15 DIAGNOSIS — F3289 Other specified depressive episodes: Secondary | ICD-10-CM | POA: Insufficient documentation

## 2013-02-15 DIAGNOSIS — E1142 Type 2 diabetes mellitus with diabetic polyneuropathy: Secondary | ICD-10-CM | POA: Diagnosis not present

## 2013-02-15 DIAGNOSIS — G8110 Spastic hemiplegia affecting unspecified side: Secondary | ICD-10-CM | POA: Diagnosis not present

## 2013-02-15 DIAGNOSIS — F329 Major depressive disorder, single episode, unspecified: Secondary | ICD-10-CM | POA: Diagnosis not present

## 2013-02-15 DIAGNOSIS — M7061 Trochanteric bursitis, right hip: Secondary | ICD-10-CM

## 2013-02-15 DIAGNOSIS — I69959 Hemiplegia and hemiparesis following unspecified cerebrovascular disease affecting unspecified side: Secondary | ICD-10-CM | POA: Insufficient documentation

## 2013-02-15 DIAGNOSIS — E1149 Type 2 diabetes mellitus with other diabetic neurological complication: Secondary | ICD-10-CM | POA: Diagnosis not present

## 2013-02-15 MED ORDER — GABAPENTIN 300 MG PO CAPS: 300.0000 mg | ORAL_CAPSULE | Freq: Three times a day (TID) | ORAL | Status: DC

## 2013-02-15 MED ORDER — TRAMADOL HCL 50 MG PO TABS: 50.0000 mg | ORAL_TABLET | Freq: Two times a day (BID) | ORAL | Status: DC

## 2013-02-15 NOTE — Progress Notes (Signed)
Subjective:    Patient ID: Tina [name], female    DOB: 1970-01-24, 43 y.o.   MRN: [id number]  HPI Hernia mesh repair 02/04/2013, hospitalized until 02/09/2013. Initially had severe right shoulder pain postoperatively but this improved after repositioning and medications. Patient has been walking up to 20 minutes at a time. Had a setback after surgery with increased right-sided fatigue.  Still takes tie standing 4 mg each bedtime does not take during the day Takes Neurontin 300 mg 3 times per day Still using both parent gel has 3 tubes at the house. Uses it as needed Tramadol on hold until Oxycodone is done Takes oxycodone about twice a day. Starting to wean herself off of these. Pain Inventory Average Pain 7 Pain Right Now 6 My pain is sharp, burning, stabbing, tingling and aching  In the last 24 hours, has pain interfered with the following? General activity 9 Relation with others 9 Enjoyment of life 9 What TIME of day is your pain at its worst? varies Sleep (in general) Poor  Pain is worse with: walking, bending and sitting Pain improves with: heat/ice and medication Relief from Meds: 5  Mobility walk without assistance how many minutes can you walk? 20 ability to climb steps?  yes do you drive?  no Do you have any goals in this area?  no  Function disabled: date disabled . Do you have any goals in this area?  no  Neuro/Psych weakness tingling trouble walking spasms confusion depression anxiety  Prior Studies Any changes since last visit?  yes  Physicians involved in your care Primary care . Neurologist . Psychiatrist .   Family History  Problem Relation Age of Onset  . Diabetes Father   . Kidney disease Father   . Other Neg Hx   . Diabetes Maternal Grandmother   . Hyperlipidemia Paternal Grandmother   . Stroke Paternal Grandmother    History   Social History  . Marital Status: Married    Spouse Name: N/A    Number of Children: N/A  .  Years of Education: N/A   Social History Main Topics  . Smoking status: Never Smoker   . Smokeless tobacco: Never Used  . Alcohol Use: No  . Drug Use: No  . Sexual Activity: Yes    Birth Control/ Protection: None   Other Topics Concern  . None   Social History Narrative  . None   Past Surgical History  Procedure Laterality Date  . Cesarean section  2012  . Uterine fibroid surgery      2 SURGERIES FOR FIBROIDS  . Ureter revision    . Ventral hernia repair  10/03/2011    Procedure: LAPAROSCOPIC VENTRAL HERNIA;  Surgeon: Adin Hector, MD;  Location: [facility];  Service: General;  Laterality: N/A;  . Hernia repair  10/03/11    ventral hernia repair  . Esophagogastroduodenoscopy N/A 06/03/2012    Procedure: ESOPHAGOGASTRODUODENOSCOPY (EGD);  Surgeon: [name] Craver, MD;  Location: [facility] ENDOSCOPY;  Service: Endoscopy;  Laterality: N/A;  . Diagnostic laparoscopy    . Umbilical hernia repair N/A 02/04/2013    Procedure: LAPAROSCOPIC ventral wall hernia repair LAPAROSCOPIC LYSIS OF ADHESIONS laparoscopic exploration of abdomen ;  Surgeon: Adin Hector, MD;  Location: [facility];  Service: General;  Laterality: N/A;  . Insertion of mesh N/A 02/04/2013    Procedure: INSERTION OF MESH;  Surgeon: Adin Hector, MD;  Location: [facility];  Service: General;  Laterality: N/A;   Past Medical History  Diagnosis Date  . Spastic hemiplegia affecting dominant side   . Panic disorder without agoraphobia   . Depression   . Anxiety   . Diabetes mellitus   . Hypertension   . Blood transfusion     IN 2012  AFTER C -SECTION  . Cerebral thrombosis with cerebral infarction JUNE 2011    RIGHT SIDED WEAKNESS ( ARM AND LEG ) AND SPASMS  . Stroke   . Right rotator cuff tear     PAIN IN RIGHT SHOULDER  . Ventral hernia     RIGHT LOWER QUADRANT-CAUSING SOME PAIN  . Anemia     DURING MENSES--HAS HEAVY BLEEDING WITH PERODS  . Headache(784.0)     MIGRAINES--NOT REALLY HEADACHE-MORE LIKE PRESSURE SENSATION IN  HEAD-FEELS DIZZIY AND  FAINT AS THE PRESSURE RESOLVES  . Diabetic neuropathy     BOTH FEET --COMES AND GOES  . Restless leg syndrome     DIAGNOSED BY SLEEP STUDY - PT TOLD SHE DID NOT HAVE SLEEP APNEA  . Rash     HANDS, ARMS --STATES HX OF RASH EVER SINCE CHILDBIRTH/PREGNANCY.  STATES THE RASH OFTEN OCCURS WHEN SHE IS REALLY STRESSED.  Marland Kitchen Hernia, incisional, RLQ, s/p lap repair Sep 2013 09/02/2011  . SBO (small bowel obstruction) 06/03/2012  . Back pain, chronic   . Leg pain, right    BP 125/75  Pulse 80  Resp 14  Ht 5\' 6"  (1.676 m)  Wt 241 lb (109.317 kg)  BMI 38.92 kg/m2  SpO2 100%  LMP 01/09/2013  Opioid Risk Score:   Fall Risk Score: Moderate Fall Risk (6-13 points) (patient educated handout declined)   Review of Systems  Musculoskeletal: Positive for back pain and gait problem.  Neurological: Positive for weakness.  Psychiatric/Behavioral: Positive for confusion and dysphoric mood. The patient is nervous/anxious.   All other systems reviewed and are negative.       Objective:   Physical Exam  Motor strength is 4/5 in the right deltoid, bicep, tricep, grip 5/5 in the left deltoid, bicep, tricep, grip 5/5 in bilateral hip flexors knee extensors ankle dorsiflexors and plantar flexors Abdomen healing surgical incisions no evidence of drainage Straight leg raising test is negative Has limited internal/external rotation on the right shoulder.      Assessment & Plan:  1. Chronic right hemiparesis due to to left pontine and left basal ganglia infarcts, with deconditioning related to recent abd hernia mesh redo.  2,  Right hip trochanteric bursitis. Would benefit from injection however the patient would like to try exercises first. This would include stretching and strengthening. Will avoid exercises that strain abdominal musculature  Resume tramadol once the oxycodone is finished  RTC 6 mo or sooner if R hip needs injection

## 2013-02-15 NOTE — Patient Instructions (Signed)
Hip Exercises RANGE OF MOTION (ROM) AND STRETCHING EXERCISES  These exercises may help you when beginning to rehabilitate your injury. Doing them too aggressively can worsen your condition. Complete them slowly and gently. Your symptoms may resolve with or without further involvement from your physician, physical therapist or athletic trainer. While completing these exercises, remember:   Restoring tissue flexibility helps normal motion to return to the joints. This allows healthier, less painful movement and activity.  An effective stretch should be held for at least 30 seconds.  A stretch should never be painful. You should only feel a gentle lengthening or release in the stretched tissue. If these stretches worsen your symptoms even when done gently, consult your physician, physical therapist or athletic trainer. STRETCH Hamstrings, Supine   Lie on your back. Loop a belt or towel over the ball of your right / left foot.  Straighten your right / left knee and slowly pull on the belt to raise your leg. Do not allow the right / left knee to bend. Keep your opposite leg flat on the floor.  Raise the leg until you feel a gentle stretch behind your right / left knee or thigh. Hold this position for __________ seconds. Repeat __________ times. Complete this stretch __________ times per day.  STRETCH - Hip Rotators   Lie on your back on a firm surface. Grasp your right / left knee with your right / left hand and your ankle with your opposite hand.  Keeping your hips and shoulders firmly planted, gently pull your right / left knee and rotate your lower leg toward your opposite shoulder until you feel a stretch in your buttocks.  Hold this stretch for __________ seconds. Repeat this stretch __________ times. Complete this stretch __________ times per day. STRETCH - Hamstrings/Adductors, V-Sit   Sit on the floor with your legs extended in a large "V," keeping your knees straight.  With your head  and chest upright, bend at your waist reaching for your right foot to stretch your left adductors.  You should feel a stretch in your left inner thigh. Hold for __________ seconds.  Return to the upright position to relax your leg muscles.  Continuing to keep your chest upright, bend straight forward at your waist to stretch your hamstrings.  You should feel a stretch behind both of your thighs and/or knees. Hold for __________ seconds.  Return to the upright position to relax your leg muscles.  Repeat steps 2 through 4 for opposite leg. Repeat __________ times. Complete this exercise __________ times per day.  STRETCHING - Hip Flexors, Lunge  Half kneel with your right / left knee on the floor and your opposite knee bent and directly over your ankle.  Keep good posture with your head over your shoulders. Tighten your buttocks to point your tailbone downward; this will prevent your back from arching too much.  You should feel a gentle stretch in the front of your thigh and/or hip. If you do not feel any resistance, slightly slide your opposite foot forward and then slowly lunge forward so your knee once again lines up over your ankle. Be sure your tailbone remains pointed downward.  Hold this stretch for __________ seconds. Repeat __________ times. Complete this stretch __________ times per day. STRENGTHENING EXERCISES These exercises may help you when beginning to rehabilitate your injury. They may resolve your symptoms with or without further involvement from your physician, physical therapist or athletic trainer. While completing these exercises, remember:   Muscles can  gain both the endurance and the strength needed for everyday activities through controlled exercises.  Complete these exercises as instructed by your physician, physical therapist or athletic trainer. Progress the resistance and repetitions only as guided.  You may experience muscle soreness or fatigue, but the pain  or discomfort you are trying to eliminate should never worsen during these exercises. If this pain does worsen, stop and make certain you are following the directions exactly. If the pain is still present after adjustments, discontinue the exercise until you can discuss the trouble with your clinician. STRENGTH - Hip Extensors, Bridge   Lie on your back on a firm surface. Bend your knees and place your feet flat on the floor.  Tighten your buttocks muscles and lift your bottom off the floor until your trunk is level with your thighs. You should feel the muscles in your buttocks and back of your thighs working. If you do not feel these muscles, slide your feet 1-2 inches further away from your buttocks.  Hold this position for __________ seconds.  Slowly lower your hips to the starting position and allow your buttock muscles relax completely before beginning the next repetition.  If this exercise is too easy, you may cross your arms over your chest. Repeat __________ times. Complete this exercise __________ times per day.  STRENGTH - Hip Abductors, Straight Leg Raises  Be aware of your form throughout the entire exercise so that you exercise the correct muscles. Sloppy form means that you are not strengthening the correct muscles.  Lie on your side so that your head, shoulders, knee and hip line up. You may bend your lower knee to help maintain your balance. Your right / left leg should be on top.  Roll your hips slightly forward, so that your hips are stacked directly over each other and your right / left knee is facing forward.  Lift your top leg up 4-6 inches, leading with your heel. Be sure that your foot does not drift forward or that your knee does not roll toward the ceiling.  Hold this position for __________ seconds. You should feel the muscles in your outer hip lifting (you may not notice this until your leg begins to tire).  Slowly lower your leg to the starting position. Allow the  muscles to fully relax before beginning the next repetition. Repeat __________ times. Complete this exercise __________ times per day.  STRENGTH - Hip Adductors, Straight Leg Raises   Lie on your side so that your head, shoulders, knee and hip line up. You may place your upper foot in front to help maintain your balance. Your right / left leg should be on the bottom.  Roll your hips slightly forward, so that your hips are stacked directly over each other and your right / left knee is facing forward.  Tense the muscles in your inner thigh and lift your bottom leg 4-6 inches. Hold this position for __________ seconds.  Slowly lower your leg to the starting position. Allow the muscles to fully relax before beginning the next repetition. Repeat __________ times. Complete this exercise __________ times per day.  STRENGTH - Quadriceps, Straight Leg Raises  Quality counts! Watch for signs that the quadriceps muscle is working to insure you are strengthening the correct muscles and not "cheating" by substituting with healthier muscles.  Lay on your back with your right / left leg extended and your opposite knee bent.  Tense the muscles in the front of your right / left thigh.  Quality counts! Watch for signs that the quadriceps muscle is working to insure you are strengthening the correct muscles and not "cheating" by substituting with healthier muscles.  · Lay on your back with your right / left leg extended and your opposite knee bent.  · Tense the muscles in the front of your right / left thigh. You should see either your knee cap slide up or increased dimpling just above the knee. Your thigh may even quiver.  · Tighten these muscles even more and raise your leg 4 to 6 inches off the floor. Hold for right / left seconds.  · Keeping these muscles tense, lower your leg.  · Relax the muscles slowly and completely in between each repetition.  Repeat __________ times. Complete this exercise __________ times per day.   STRENGTH - Hip Abductors, Standing  · Tie one end of a rubber exercise band/tubing to a secure surface (table, pole) and tie a loop at the other end.  · Place the loop around your right / left ankle. Keeping your ankle with the band directly opposite of the secured end, step away until there is tension in the  tube/band.  · Hold onto a chair as needed for balance.  · Keeping your back upright, your shoulders over your hips, and your toes pointing forward, lift your right / left leg out to your side. Be sure to lift your leg with your hip muscles. Do not "throw" your leg or tip your body to lift your leg.  · Slowly and with control, return to the starting position.  Repeat exercise __________ times. Complete this exercise __________ times per day.   STRENGTH  Quadriceps, Squats  · Stand in a door frame so that your feet and knees are in line with the frame.  · Use your hands for balance, not support, on the frame.  · Slowly lower your weight, bending at the hips and knees. Keep your lower legs upright so that they are parallel with the door frame. Squat only within the range that does not increase your knee pain. Never let your hips drop below your knees.  · Slowly return upright, pushing with your legs, not pulling with your hands.  Document Released: 01/17/2005 Document Revised: 03/24/2011 Document Reviewed: 04/13/2008  [employer]® Patient Information ©2014 [employer].

## 2013-02-16 ENCOUNTER — Encounter: Payer: Self-pay | Admitting: Physical Medicine & Rehabilitation

## 2013-02-16 ENCOUNTER — Telehealth: Payer: Self-pay

## 2013-02-16 DIAGNOSIS — G8110 Spastic hemiplegia affecting unspecified side: Secondary | ICD-10-CM | POA: Insufficient documentation

## 2013-02-16 MED ORDER — TIZANIDINE HCL 4 MG PO TABS
4.0000 mg | ORAL_TABLET | Freq: Two times a day (BID) | ORAL | Status: DC
Start: 2013-02-16 — End: 2013-05-09

## 2013-02-16 NOTE — Telephone Encounter (Signed)
Pharmacy request was sent over for Tizanidine. Is this okay to fill?

## 2013-02-16 NOTE — Telephone Encounter (Signed)
zanaflex escribed

## 2013-02-16 NOTE — Telephone Encounter (Signed)
yes

## 2013-02-22 ENCOUNTER — Ambulatory Visit: Payer: Medicare Other | Admitting: Physical Medicine & Rehabilitation

## 2013-02-23 ENCOUNTER — Encounter (INDEPENDENT_AMBULATORY_CARE_PROVIDER_SITE_OTHER): Payer: Medicare Other | Admitting: Surgery

## 2013-02-25 DIAGNOSIS — E119 Type 2 diabetes mellitus without complications: Secondary | ICD-10-CM | POA: Diagnosis not present

## 2013-02-25 DIAGNOSIS — N390 Urinary tract infection, site not specified: Secondary | ICD-10-CM | POA: Diagnosis not present

## 2013-02-25 DIAGNOSIS — R109 Unspecified abdominal pain: Secondary | ICD-10-CM | POA: Diagnosis not present

## 2013-02-25 DIAGNOSIS — R209 Unspecified disturbances of skin sensation: Secondary | ICD-10-CM | POA: Diagnosis not present

## 2013-02-28 ENCOUNTER — Encounter (INDEPENDENT_AMBULATORY_CARE_PROVIDER_SITE_OTHER): Payer: Self-pay | Admitting: Surgery

## 2013-02-28 ENCOUNTER — Ambulatory Visit (INDEPENDENT_AMBULATORY_CARE_PROVIDER_SITE_OTHER): Payer: Medicare Other | Admitting: Surgery

## 2013-02-28 VITALS — BP 122/80 | HR 72 | Temp 98.6°F | Resp 16 | Ht 66.5 in | Wt 239.0 lb

## 2013-02-28 DIAGNOSIS — R197 Diarrhea, unspecified: Secondary | ICD-10-CM | POA: Insufficient documentation

## 2013-02-28 DIAGNOSIS — E669 Obesity, unspecified: Secondary | ICD-10-CM

## 2013-02-28 DIAGNOSIS — R194 Change in bowel habit: Secondary | ICD-10-CM | POA: Insufficient documentation

## 2013-02-28 DIAGNOSIS — K5900 Constipation, unspecified: Secondary | ICD-10-CM

## 2013-02-28 DIAGNOSIS — K5909 Other constipation: Secondary | ICD-10-CM

## 2013-02-28 DIAGNOSIS — K432 Incisional hernia without obstruction or gangrene: Secondary | ICD-10-CM

## 2013-02-28 MED ORDER — TRAMADOL HCL 50 MG PO TABS: 50.0000 mg | ORAL_TABLET | Freq: Four times a day (QID) | ORAL | Status: DC | PRN

## 2013-02-28 NOTE — Progress Notes (Signed)
Subjective:     Patient ID: Tina [name], female   DOB: Aug 13, 1970, 43 y.o.   MRN: [id number]  HPI  Note: This dictation was prepared with Dragon/digital dictation along with [facility] technology. Any transcriptional errors that result from this process are unintentional.       Lilibeth VALENA IVANOV  1970/03/24 [id number]  Patient Care Team: Anda Kraft, MD as PCP - General Sheronette Clint Bolder, MD as Consulting Physician (Obstetrics and Gynecology) Garvin Fila, MD as Consulting Physician (Neurology) [name] Craver, MD as Consulting Physician (Gastroenterology)  Procedure (Date: 02/04/2013):  POST-OPERATIVE DIAGNOSIS: Recurrent ventral wall incisional hernia   PROCEDURE: Procedure(s):  LAPAROSCOPIC LYSIS OF ADHESIONS 57min (1/2 case)  LAPAROSCOPIC ventral wall hernia repair  INSERTION OF MESH   SURGEON: Surgeon(s):  Adin Hector, MD  OR FINDINGS: Poor incorporation of central part of mesh with persistent hernia sac. Seromas. No abscess. Gram stain negative for organisms.  8 x 7 recurrent area of hernia. Outer 2/3 rim of old Physiomesh well incorporated.  Dense adhesions of small bowel to central part of mesh. Some incorporation. No fistula.  Type of repair - Laparoscopic underlay repair  Name of mesh - Bard Ventralight dual sided (polypropylene / Seprafilm)  Size of mesh - Length 33 cm, Width 25 cm  Mesh overlap - 7-10 cm  Placement of mesh - Intraperitoneal underlay repair   Anaerobic culture   Status: Final result Visible to patient: This result is viewable by the patient in [place]. Next appt: 08/15/2013 at 02:45 PM in Physical Medicine and Rehabilitation [name], MD)           3wk ago    Specimen Description ABSCESS ABDOMEN HERNIA [place]     Special Requests PATIENT ON FOLLOWING [facility]     Gram Stain RARE WBC NO ORGANISMS SEEN Performed at [facility]    Culture NO ANAEROBES ISOLATED Performed at [facility]    Report Status  02/09/2013 FINAL     Resulting Agency [facility]   This patient returns for surgical re-evaluation.  She is doing okay.  Not out of the woods, but she definitely feels that she has made improvements.  Still has some soreness but improved.  No longer diffuse.  No longer all-time.  Mainly in the left upper quadrant.  She takes 50 mg tramadol twice a day.  That is it.  Coralee Pesa was born ointment helping so she stopped.  Not taking any anti-inflammatory.  Stop taking oxycodone.  She has had some loose stools and diarrhea.  Taking Metamucil every day.  No sick contacts.  No hematochezia or melena.  No fevers or chills.  Energy level gradually getting better.  Glucose is still a challenge to control.  She does that she smokes and take Valentine's Day but otherwise is trying to be good on her diet.  Patient Active Problem List   Diagnosis Date Noted  . Spastic hemiplegia affecting dominant side 02/16/2013  . Recurrent ventral incisional hernia s/p closure/repair w mesh 02/04/2013 01/26/2013  . Constipation, chronic 01/26/2013  . Abdominal pain, chronic, right lower quadrant 06/03/2012  . History of CVA (cerebrovascular accident) 06/03/2012  . HTN (hypertension) 06/03/2012  . Hx of migraines 10/19/2011  . Diabetes mellitus type II, uncontrolled 10/16/2011  . Obesity (BMI 30-39.9) 09/02/2011  . Rotator cuff tear 08/25/2011  . Sciatica 06/10/2011  . Paresthesias in right hand 06/10/2011  . Lumbago 05/09/2011  . Paresthesias 05/09/2011    Past Medical History  Diagnosis Date  .  Spastic hemiplegia affecting dominant side   . Panic disorder without agoraphobia   . Depression   . Anxiety   . Diabetes mellitus   . Hypertension   . Blood transfusion     IN 2012  AFTER C -SECTION  . Cerebral thrombosis with cerebral infarction JUNE 2011    RIGHT SIDED WEAKNESS ( ARM AND LEG ) AND SPASMS  . Stroke   . Right rotator cuff tear     PAIN IN RIGHT SHOULDER  . Ventral hernia     RIGHT LOWER  QUADRANT-CAUSING SOME PAIN  . Anemia     DURING MENSES--HAS HEAVY BLEEDING WITH PERODS  . Headache(784.0)     MIGRAINES--NOT REALLY HEADACHE-MORE LIKE PRESSURE SENSATION IN HEAD-FEELS DIZZIY AND  FAINT AS THE PRESSURE RESOLVES  . Diabetic neuropathy     BOTH FEET --COMES AND GOES  . Restless leg syndrome     DIAGNOSED BY SLEEP STUDY - PT TOLD SHE DID NOT HAVE SLEEP APNEA  . Rash     HANDS, ARMS --STATES HX OF RASH EVER SINCE CHILDBIRTH/PREGNANCY.  STATES THE RASH OFTEN OCCURS WHEN SHE IS REALLY STRESSED.  Marland Kitchen Hernia, incisional, RLQ, s/p lap repair Sep 2013 09/02/2011  . SBO (small bowel obstruction) 06/03/2012  . Back pain, chronic   . Leg pain, right     Past Surgical History  Procedure Laterality Date  . Cesarean section  2012  . Uterine fibroid surgery      2 SURGERIES FOR FIBROIDS  . Ureter revision    . Ventral hernia repair  10/03/2011    Procedure: LAPAROSCOPIC VENTRAL HERNIA;  Surgeon: Adin Hector, MD;  Location: [facility];  Service: General;  Laterality: N/A;  . Hernia repair  10/03/11    ventral hernia repair  . Esophagogastroduodenoscopy N/A 06/03/2012    Procedure: ESOPHAGOGASTRODUODENOSCOPY (EGD);  Surgeon: [name] Craver, MD;  [facility] END[facility];  Service: Endoscopy;  Laterality: N/A;  . Diagnostic laparoscopy    . Umbilical hernia repair N/A 02/04/2013    Procedure: LAPAROSCOPIC ventral wall hernia repair LAPAROSCOPIC LYSIS OF ADHESIONS laparoscopic exploration of abdomen ;  Surgeon: Adin Hector, MD;  Location: [facility];  Service: General;  Laterality: N/A;  . Insertion of mesh N/A 02/04/2013    Procedure: INSERTION OF MESH;  Surgeon: Adin Hector, MD;  Location: [facility];  Service: General;  Laterality: N/A;    History   Social History  . Marital Status: Married    Spouse Name: N/A    Number of Children: N/A  . Years of Education: N/A   Occupational History  . Not on file.   Social History Main Topics  . Smoking status: Never Smoker   . Smokeless tobacco:  Never Used  . Alcohol Use: No  . Drug Use: No  . Sexual Activity: Yes    Birth Control/ Protection: None   Other Topics Concern  . Not on file   Social History Narrative  . No narrative on file    Family History  Problem Relation Age of Onset  . Diabetes Father   . Kidney disease Father   . Other Neg Hx   . Diabetes Maternal Grandmother   . Hyperlipidemia Paternal Grandmother   . Stroke Paternal Grandmother     Current Outpatient Prescriptions  Medication Sig Dispense Refill  . Amlodipine-Valsartan-HCTZ 10-320-25 MG TABS Take 1 tablet by mouth every evening.       Marland Kitchen aspirin 325 MG tablet Take 325 mg by mouth daily.      Marland Kitchen  B-D ULTRAFINE III SHORT PEN 31G X 8 MM MISC       . BYSTOLIC 10 MG tablet Take 10 mg by mouth every morning.       . diazepam (VALIUM) 5 MG tablet       . diclofenac sodium (VOLTAREN) 1 % GEL Apply 2 g topically 4 (four) times daily.      . furosemide (LASIX) 40 MG tablet Take 20-40 mg by mouth daily as needed for fluid or edema.       . insulin glargine (LANTUS) 100 UNIT/ML injection Inject 22 Units into the skin 2 (two) times daily.       . insulin lispro (HUMALOG) 100 UNIT/ML injection Inject 10-15 Units into the skin 3 (three) times daily before meals.       . INVOKANA 300 MG TABS Take 300 mg by mouth every morning.       . Multiple Vitamin (MULTIVITAMIN WITH MINERALS) TABS Take 1 tablet by mouth daily.      Marland Kitchen tiZANidine (ZANAFLEX) 4 MG tablet Take 1 tablet (4 mg total) by mouth 2 (two) times daily.  60 tablet  1  . traMADol (ULTRAM) 50 MG tablet Take 1 tablet (50 mg total) by mouth 2 (two) times daily.  60 tablet  5   No current facility-administered medications for this visit.     Allergies  Allergen Reactions  . Contrast Media [Iodinated Diagnostic Agents] Other (See Comments)    Difficulty breathing  . Iohexol Hives, Nausea And Vomiting and Swelling     Desc: Magnevist-gadolinium-difficulty breathing, throat swelling   . Midazolam Hcl  Anaphylaxis    Difficulty breathing  . Shellfish Allergy Anaphylaxis  . Avandia [Rosiglitazone Maleate]     Patient doesn't remember reaction  . Geodon [Ziprasidone]   . Kiwi Extract Itching and Swelling  . Metformin And Related Nausea And Vomiting and Other (See Comments)    diarrhea  . Other Itching    Patient is allergic to all nuts except peanuts.     BP 122/80  Pulse 72  Temp(Src) 98.6 F (37 C) (Oral)  Resp 16  Ht 5' 6.5" (1.689 m)  Wt 239 lb (108.41 kg)  BMI 38.00 kg/m2  LMP 01/09/2013  No results found.   Review of Systems  Constitutional: Negative for fever, chills and diaphoresis.  HENT: Negative for ear pain, sore throat and trouble swallowing.   Eyes: Negative for photophobia and visual disturbance.  Respiratory: Negative for cough and choking.   Cardiovascular: Negative for chest pain and palpitations.  Gastrointestinal: Positive for abdominal pain. Negative for nausea, vomiting, diarrhea, constipation, anal bleeding and rectal pain.  Genitourinary: Negative for dysuria, frequency and difficulty urinating.  Musculoskeletal: Negative for gait problem and myalgias.  Skin: Negative for color change, pallor and rash.  Neurological: Negative for dizziness, speech difficulty, weakness and numbness.  Hematological: Negative for adenopathy.  Psychiatric/Behavioral: Negative for confusion and agitation. The patient is not nervous/anxious.        Objective:   Physical Exam  Constitutional: She is oriented to person, place, and time. She appears well-developed and well-nourished. No distress.  HENT:  Head: Normocephalic.  Mouth/Throat: Oropharynx is clear and moist. No oropharyngeal exudate.  Eyes: Conjunctivae and EOM are normal. Pupils are equal, round, and reactive to light. No scleral icterus.  Neck: Normal range of motion. No tracheal deviation present.  Cardiovascular: Normal rate and intact distal pulses.   Pulmonary/Chest: Effort normal. No respiratory  distress. She exhibits no tenderness.  Abdominal:  Soft. She exhibits no distension. There is no tenderness. Hernia confirmed negative in the right inguinal area and confirmed negative in the left inguinal area.  Incisions clean with normal healing ridges.  No hernias  Genitourinary: No vaginal discharge found.  Musculoskeletal: Normal range of motion. She exhibits no tenderness.  Lymphadenopathy:       Right: No inguinal adenopathy present.       Left: No inguinal adenopathy present.  Neurological: She is alert and oriented to person, place, and time. No cranial nerve deficit. She exhibits normal muscle tone. Coordination normal.  Skin: Skin is warm and dry. No rash noted. She is not diaphoretic.  Psychiatric: She has a normal mood and affect. Her behavior is normal.       Assessment:     Gradual recovery in a morbidly obese and diabetic patient with neurological issues status post laparoscopic repair of a giant hernia recurrence and her abdomen     Plan:     Increase activity as tolerated to regular activity.  Low impact exercise such as walking an hour a day at least ideal.  Do not push through pain.  Improve pain control.  Tylenol 4 times a day.  Increase tramadol to 50-100 every 6 hours when necessary for pain.  2 pills a day he is not to be enough for now.  That is her baseline anyway.  Diet as tolerated.  Low fat high fiber diet ideal.  Bowel regimen with 30 g fiber a day and fiber supplement as needed to avoid problems.  Cut back on the Metamucil for the next week.  Consider Pepto-Bismol or Imodium if diarrhea does not resolve.  Come back and see me in 3 weeks to make sure that she is improving.  Otherwise return to clinic sooner as needed.   Instructions discussed.  Followup with primary care physician for other health issues as would normally be done.  Consider screening for malignancies (breast, prostate, colon, melanoma, etc) as appropriate.  Questions answered.  The patient  expressed understanding and appreciation

## 2013-02-28 NOTE — Patient Instructions (Signed)
Managing Pain  Pain after surgery or related to activity is often due to strain/injury to muscle, tendon, nerves and/or incisions.  This pain is usually short-term and will improve in a few months.   Many people find it helpful to do the following things TOGETHER to help speed the process of healing and to get back to regular activity more quickly:  1. Avoid heavy physical activity a.  no lifting greater than 20 pounds b. Do not "push through" the pain.  Listen to your body and avoid positions and maneuvers than reproduce the pain c. Walking is okay as tolerated, but go slowly and stop when getting sore.  d. Remember: If it hurts to do it, then don't do it! 2. Take Anti-inflammatory medication  a. Take with food/snack around the clock for 1-2 weeks i. This helps the muscle and nerve tissues become less irritable and calm down faster ii. Choose Acetaminophen 500mg  tabs (Tylenol) 2 pills with every meal and just before bedtime 3. Use a Heating pad or Ice/Cold Pack a. 4-6 times a day b. May use warm bath/hottub  or showers 4. Try Gentle Massage and/or Stretching  a. at the area of pain many times a day b. stop if you feel pain - do not overdo it  Try these steps together to help you body heal faster and avoid making things get worse.  Doing just one of these things may not be enough.    If you are not getting better after two weeks or are noticing you are getting worse, contact our office for further advice; we may need to re-evaluate you & see what other things we can do to help.  HERNIA REPAIR: POST OP INSTRUCTIONS  1. DIET: Follow a light bland diet the first 24 hours after arrival home, such as soup, liquids, crackers, etc.  Be sure to include lots of fluids daily.  Avoid fast food or heavy meals as your are more likely to get nauseated.  Eat a low fat the next few days after surgery. 2. Take your usually prescribed home medications unless otherwise directed. 3. PAIN CONTROL: a. Pain  is best controlled by a usual combination of three different methods TOGETHER: i. Ice/Heat ii. Over the counter pain medication iii. Prescription pain medication b. Most patients will experience some swelling and bruising around the hernia(s) such as the bellybutton, groins, or old incisions.  Ice packs or heating pads (30-60 minutes up to 6 times a day) will help. Use ice for the first few days to help decrease swelling and bruising, then switch to heat to help relax tight/sore spots and speed recovery.  Some people prefer to use ice alone, heat alone, alternating between ice & heat.  Experiment to what works for you.  Swelling and bruising can take several weeks to resolve.   c. It is helpful to take an over-the-counter pain medication regularly for the first few weeks.  Choose one of the following that works best for you: i. Naproxen (Aleve, etc)  Two 220mg  tabs twice a day ii. Ibuprofen (Advil, etc) Three 200mg  tabs four times a day (every meal & bedtime) iii. Acetaminophen (Tylenol, etc) 325-650mg  four times a day (every meal & bedtime) d. A  prescription for pain medication should be given to you upon discharge.  Take your pain medication as prescribed.  i. If you are having problems/concerns with the prescription medicine (does not control pain, nausea, vomiting, rash, itching, etc), please call us [phone] to see if  we need to switch you to a different pain medicine that will work better for you and/or control your side effect better. ii. If you need a refill on your pain medication, please contact your pharmacy.  They will contact our office to request authorization. Prescriptions will not be filled after 5 pm or on week-ends. 4. Avoid getting constipated.  Between the surgery and the pain medications, it is common to experience some constipation.  Increasing fluid intake and taking a fiber supplement (such as Metamucil, Citrucel, FiberCon, MiraLax, etc) 1-2 times a day regularly will  usually help prevent this problem from occurring.  A mild laxative (prune juice, Milk of Magnesia, MiraLax, etc) should be taken according to package directions if there are no bowel movements after 48 hours.   5. Wash / shower every day.  You may shower over the dressings as they are waterproof.   6. Remove your waterproof bandages 5 days after surgery.  You may leave the incision open to air.  You may replace a dressing/Band-Aid to cover the incision for comfort if you wish.  Continue to shower over incision(s) after the dressing is off.    7. ACTIVITIES as tolerated:   a. You may resume regular (light) daily activities beginning the next day-such as daily self-care, walking, climbing stairs-gradually increasing activities as tolerated.  If you can walk 30 minutes without difficulty, it is safe to try more intense activity such as jogging, treadmill, bicycling, low-impact aerobics, swimming, etc. b. Save the most intensive and strenuous activity for last such as sit-ups, heavy lifting, contact sports, etc  Refrain from any heavy lifting or straining until you are off narcotics for pain control.   c. DO NOT PUSH THROUGH PAIN.  Let pain be your guide: If it hurts to do something, don't do it.  Pain is your body warning you to avoid that activity for another week until the pain goes down. d. You may drive when you are no longer taking prescription pain medication, you can comfortably wear a seatbelt, and you can safely maneuver your car and apply brakes. e. Dennis Bast may have sexual intercourse when it is comfortable.  8. FOLLOW UP in our office a. Please call [facility] at [phone] to set up an appointment to see your surgeon in the office for a follow-up appointment approximately 2-3 weeks after your surgery. b. Make sure that you call for this appointment the day you arrive home to insure a convenient appointment time. 9.  IF YOU HAVE DISABILITY OR FAMILY LEAVE FORMS, BRING THEM TO THE OFFICE FOR  PROCESSING.  DO NOT GIVE THEM TO YOUR DOCTOR.  WHEN TO CALL us [phone]: 1. Poor pain control 2. Reactions / problems with new medications (rash/itching, nausea, etc)  3. Fever over 101.5 F (38.5 C) 4. Inability to urinate 5. Nausea and/or vomiting 6. Worsening swelling or bruising 7. Continued bleeding from incision. 8. Increased pain, redness, or drainage from the incision   The clinic staff is available to answer your questions during regular business hours (8:30am-5pm).  Please don't hesitate to call and ask to speak to one of our nurses for clinical concerns.   If you have a medical emergency, go to the nearest emergency room or call 911.  A surgeon from [facility] is always on call at the hospitals in [facility], [place], [place], [place], [place]  [address] ?  [address] [address], [place], [place]   [address] MAIN: (  [phone] ? TOLL FREE: [phone] ? FAX: [phone] [email or web address]  GETTING TO GOOD BOWEL HEALTH. Irregular bowel habits such as constipation and diarrhea can lead to many problems over time.  Having one soft bowel movement a day is the most important way to prevent further problems.  The anorectal canal is designed to handle stretching and feces to safely manage our ability to get rid of solid waste (feces, poop, stool) out of our body.  BUT, hard constipated stools can act like ripping concrete bricks and diarrhea can be a burning fire to this very sensitive area of our body, causing inflamed hemorrhoids, anal fissures, increasing risk is perirectal abscesses, abdominal pain/bloating, an making irritable bowel worse.     The goal: ONE SOFT BOWEL MOVEMENT A DAY!  To have soft, regular bowel movements:    Drink at least 8 tall glasses of water a day.     Take plenty of fiber.  Fiber is the undigested part of plant food that passes into the colon, acting s "natures broom" to encourage bowel  motility and movement.  Fiber can absorb and hold large amounts of water. This results in a larger, bulkier stool, which is soft and easier to pass. Work gradually over several weeks up to 6 servings a day of fiber (25g a day even more if needed) in the form of: o Vegetables -- Root (potatoes, carrots, turnips), leafy green (lettuce, salad greens, celery, spinach), or cooked high residue (cabbage, broccoli, etc) o Fruit -- Fresh (unpeeled skin & pulp), Dried (prunes, apricots, cherries, etc ),  or stewed ( applesauce)  o Whole grain breads, pasta, etc (whole wheat)  o Bran cereals    Bulking Agents -- This type of water-retaining fiber generally is easily obtained each day by one of the following:  o Psyllium bran -- The psyllium plant is remarkable because its ground seeds can retain so much water. This product is available as Metamucil, Konsyl, Effersyllium, Per Diem Fiber, or the less expensive generic preparation in drug and health food stores. Although labeled a laxative, it really is not a laxative.  o Methylcellulose -- This is another fiber derived from wood which also retains water. It is available as Citrucel. o Polyethylene Glycol - and "artificial" fiber commonly called Miralax or Glycolax.  It is helpful for people with gassy or bloated feelings with regular fiber o Flax Seed - a less gassy fiber than psyllium   No reading or other relaxing activity while on the toilet. If bowel movements take longer than 5 minutes, you are too constipated   AVOID CONSTIPATION.  High fiber and water intake usually takes care of this.  Sometimes a laxative is needed to stimulate more frequent bowel movements, but    Laxatives are not a good long-term solution as it can wear the colon out. o Osmotics (Milk of Magnesia, Fleets phosphosoda, Magnesium citrate, MiraLax, GoLytely) are safer than  o Stimulants (Senokot, Castor Oil, Dulcolax, Ex Lax)    o Do not take laxatives for more than 7days in a row.    IF  SEVERELY CONSTIPATED, try a Bowel Retraining Program: o Do not use laxatives.  o Eat a diet high in roughage, such as bran cereals and leafy vegetables.  o Drink six (6) ounces of prune or apricot juice each morning.  o Eat two (2) large servings of stewed fruit each day.  o Take one (1) heaping tablespoon of a psyllium-based bulking agent twice a day. Use sugar-free sweetener when  possible to avoid excessive calories.  o Eat a normal breakfast.  o Set aside 15 minutes after breakfast to sit on the toilet, but do not strain to have a bowel movement.  o If you do not have a bowel movement by the third day, use an enema and repeat the above steps.    Controlling diarrhea o Switch to liquids and simpler foods for a few days to avoid stressing your intestines further. o Avoid dairy products (especially milk & ice cream) for a short time.  The intestines often can lose the ability to digest lactose when stressed. o Avoid foods that cause gassiness or bloating.  Typical foods include beans and other legumes, cabbage, broccoli, and dairy foods.  Every person has some sensitivity to other foods, so listen to our body and avoid those foods that trigger problems for you. o Adding fiber (Citrucel, Metamucil, psyllium, Miralax) gradually can help thicken stools by absorbing excess fluid and retrain the intestines to act more normally.  Slowly increase the dose over a few weeks.  Too much fiber too soon can backfire and cause cramping & bloating. o Probiotics (such as active yogurt, Align, etc) may help repopulate the intestines and colon with normal bacteria and calm down a sensitive digestive tract.  Most studies show it to be of mild help, though, and such products can be costly. o Medicines:   Bismuth subsalicylate (ex. Kayopectate, Pepto Bismol) every 30 minutes for up to 6 doses can help control diarrhea.  Avoid if pregnant.   Loperamide (Immodium) can slow down diarrhea.  Start with two tablets (4mg   total) first and then try one tablet every 6 hours.  Avoid if you are having fevers or severe pain.  If you are not better or start feeling worse, stop all medicines and call your doctor for advice o Call your doctor if you are getting worse or not better.  Sometimes further testing (cultures, endoscopy, X-ray studies, bloodwork, etc) may be needed to help diagnose and treat the cause of the diarrhea. o

## 2013-03-11 DIAGNOSIS — E119 Type 2 diabetes mellitus without complications: Secondary | ICD-10-CM | POA: Diagnosis not present

## 2013-03-11 DIAGNOSIS — G609 Hereditary and idiopathic neuropathy, unspecified: Secondary | ICD-10-CM | POA: Diagnosis not present

## 2013-03-11 DIAGNOSIS — I10 Essential (primary) hypertension: Secondary | ICD-10-CM | POA: Diagnosis not present

## 2013-03-11 DIAGNOSIS — R109 Unspecified abdominal pain: Secondary | ICD-10-CM | POA: Diagnosis not present

## 2013-03-18 DIAGNOSIS — E119 Type 2 diabetes mellitus without complications: Secondary | ICD-10-CM | POA: Diagnosis not present

## 2013-03-18 DIAGNOSIS — I10 Essential (primary) hypertension: Secondary | ICD-10-CM | POA: Diagnosis not present

## 2013-03-18 DIAGNOSIS — R109 Unspecified abdominal pain: Secondary | ICD-10-CM | POA: Diagnosis not present

## 2013-03-18 DIAGNOSIS — M79609 Pain in unspecified limb: Secondary | ICD-10-CM | POA: Diagnosis not present

## 2013-03-21 ENCOUNTER — Telehealth (INDEPENDENT_AMBULATORY_CARE_PROVIDER_SITE_OTHER): Payer: Self-pay | Admitting: *Deleted

## 2013-03-21 ENCOUNTER — Encounter (INDEPENDENT_AMBULATORY_CARE_PROVIDER_SITE_OTHER): Payer: Self-pay | Admitting: Surgery

## 2013-03-21 ENCOUNTER — Ambulatory Visit (INDEPENDENT_AMBULATORY_CARE_PROVIDER_SITE_OTHER): Payer: Federal, State, Local not specified - PPO | Admitting: Surgery

## 2013-03-21 VITALS — BP 128/82 | HR 72 | Temp 98.2°F | Resp 14 | Ht 66.0 in | Wt 242.6 lb

## 2013-03-21 DIAGNOSIS — K432 Incisional hernia without obstruction or gangrene: Secondary | ICD-10-CM

## 2013-03-21 DIAGNOSIS — K5909 Other constipation: Secondary | ICD-10-CM

## 2013-03-21 DIAGNOSIS — K5900 Constipation, unspecified: Secondary | ICD-10-CM

## 2013-03-21 DIAGNOSIS — R110 Nausea: Secondary | ICD-10-CM | POA: Insufficient documentation

## 2013-03-21 DIAGNOSIS — R112 Nausea with vomiting, unspecified: Secondary | ICD-10-CM

## 2013-03-21 MED ORDER — OXYCODONE HCL 5 MG PO TABS: 5.0000 mg | ORAL_TABLET | ORAL | Status: DC | PRN

## 2013-03-21 MED ORDER — PROMETHAZINE HCL 12.5 MG PO TABS: 12.5000 mg | ORAL_TABLET | Freq: Four times a day (QID) | ORAL | Status: DC | PRN

## 2013-03-21 NOTE — Patient Instructions (Signed)
Please consider the recommendations that we have given you today:  Continue aggressive pain control.  Will try stronger narcotic oxycodone for now.  Obtain CT scan of abdomen and pelvis to rule out bowel obstruction, abscesses, hernia recurrence.  Call our office the next day after the CAT scan for results    Control nausea aggressively.  This maybe due to diabetic gastroparesis/delayed stomach emptying.  You most likely would benefit from a gastric emptying study to diagnose this..  However, will not be accurate study while on narcotics.  Try and minimize this first.  Consider evaluation with gastroenterology if having persistent abdominal complaints with nausea and vomiting.  See the Handout(s) we have given you.  Please call our office at [phone] if you wish to schedule surgery or if you have further questions / concerns.   Managing Pain  Pain after surgery or related to activity is often due to strain/injury to muscle, tendon, nerves and/or incisions.  This pain is usually short-term and will improve in a few months.   Many people find it helpful to do the following things TOGETHER to help speed the process of healing and to get back to regular activity more quickly:  1. Avoid heavy physical activity a.  no lifting greater than 20 pounds b. Do not "push through" the pain.  Listen to your body and avoid positions and maneuvers than reproduce the pain c. Walking is okay as tolerated, but go slowly and stop when getting sore.  d. Remember: If it hurts to do it, then don't do it! 2. Take Anti-inflammatory medication  a. Take with food/snack around the clock for 1-2 weeks i. This helps the muscle and nerve tissues become less irritable and calm down faster b. Choose ONE of the following over-the-counter medications: i. Naproxen 220mg  tabs (ex. Aleve) 1-2 pills twice a day  ii. Ibuprofen 200mg  tabs (ex. Advil, Motrin) 3-4 pills with every meal and just before  bedtime iii. Acetaminophen 500mg  tabs (Tylenol) 1-2 pills with every meal and just before bedtime 3. Use a Heating pad or Ice/Cold Pack a. 4-6 times a day b. May use warm bath/hottub  or showers 4. Try Gentle Massage and/or Stretching  a. at the area of pain many times a day b. stop if you feel pain - do not overdo it  Try these steps together to help you body heal faster and avoid making things get worse.  Doing just one of these things may not be enough.    If you are not getting better after two weeks or are noticing you are getting worse, contact our office for further advice; we may need to re-evaluate you & see what other things we can do to help.  Nausea and Vomiting Nausea is a sick feeling that often comes before throwing up (vomiting). Vomiting is a reflex where stomach contents come out of your mouth. Vomiting can cause severe loss of body fluids (dehydration). Children and elderly adults can become dehydrated quickly, especially if they also have diarrhea. Nausea and vomiting are symptoms of a condition or disease. It is important to find the cause of your symptoms. CAUSES   Direct irritation of the stomach lining. This irritation can result from increased acid production (gastroesophageal reflux disease), infection, food poisoning, taking certain medicines (such as nonsteroidal anti-inflammatory drugs), alcohol use, or tobacco use.  Signals from the brain.These signals could be caused by a headache, heat exposure, an inner ear disturbance, increased pressure in the brain from injury, infection,  a tumor, or a concussion, pain, emotional stimulus, or metabolic problems.  An obstruction in the gastrointestinal tract (bowel obstruction).  Illnesses such as diabetes, hepatitis, gallbladder problems, appendicitis, kidney problems, cancer, sepsis, atypical symptoms of a heart attack, or eating disorders.  Medical treatments such as chemotherapy and radiation.  Receiving medicine  that makes you sleep (general anesthetic) during surgery. DIAGNOSIS Your caregiver may ask for tests to be done if the problems do not improve after a few days. Tests may also be done if symptoms are severe or if the reason for the nausea and vomiting is not clear. Tests may include:  Urine tests.  Blood tests.  Stool tests.  Cultures (to look for evidence of infection).  X-rays or other imaging studies. Test results can help your caregiver make decisions about treatment or the need for additional tests. TREATMENT You need to stay well hydrated. Drink frequently but in small amounts.You may wish to drink water, sports drinks, clear broth, or eat frozen ice pops or gelatin dessert to help stay hydrated.When you eat, eating slowly may help prevent nausea.There are also some antinausea medicines that may help prevent nausea. HOME CARE INSTRUCTIONS   Take all medicine as directed by your caregiver.  If you do not have an appetite, do not force yourself to eat. However, you must continue to drink fluids.  If you have an appetite, eat a normal diet unless your caregiver tells you differently.  Eat a variety of complex carbohydrates (rice, wheat, potatoes, bread), lean meats, yogurt, fruits, and vegetables.  Avoid high-fat foods because they are more difficult to digest.  Drink enough water and fluids to keep your urine clear or pale yellow.  If you are dehydrated, ask your caregiver for specific rehydration instructions. Signs of dehydration may include:  Severe thirst.  Dry lips and mouth.  Dizziness.  Dark urine.  Decreasing urine frequency and amount.  Confusion.  Rapid breathing or pulse. SEEK IMMEDIATE MEDICAL CARE IF:   You have blood or brown flecks (like coffee grounds) in your vomit.  You have black or bloody stools.  You have a severe headache or stiff neck.  You are confused.  You have severe abdominal pain.  You have chest pain or trouble  breathing.  You do not urinate at least once every 8 hours.  You develop cold or clammy skin.  You continue to vomit for longer than 24 to 48 hours.  You have a fever. MAKE SURE YOU:   Understand these instructions.  Will watch your condition.  Will get help right away if you are not doing well or get worse. Document Released: 12/30/2004 Document Revised: 03/24/2011 Document Reviewed: 05/29/2010 [facility] Patient Information 2014 [place], [place].  Gastroparesis  Gastroparesis is also called slowed stomach emptying (delayed gastric emptying). It is a condition in which the stomach takes too long to empty its contents. It often happens in people with diabetes.  CAUSES  Gastroparesis happens when nerves to the stomach are damaged or stop working. When the nerves are damaged, the muscles of the stomach and intestines do not work normally. The movement of food is slowed or stopped. High blood glucose (sugar) causes changes in nerves and can damage the blood vessels that carry oxygen and nutrients to the nerves. RISK FACTORS  Diabetes.  Post-viral syndromes.  Eating disorders (anorexia, bulimia).  Surgery on the stomach or vagus nerve.  Gastroesophageal reflux disease (rarely).  Smooth muscle disorders (amyloidosis, scleroderma).  Metabolic disorders, including hypothyroidism.  Parkinson disease. SYMPTOMS  Heartburn.  Feeling sick to your stomach (nausea).  Vomiting of undigested food.  An early feeling of fullness when eating.  Weight loss.  Abdominal bloating.  Erratic blood glucose levels.  Lack of appetite.  Gastroesophageal reflux.  Spasms of the stomach wall. Complications can include:  Bacterial overgrowth in stomach. Food stays in the stomach and can ferment and cause bacteria to grow.  Weight loss due to difficulty digesting and absorbing nutrients.  Vomiting.  Obstruction in the stomach. Undigested food can harden and cause nausea and  vomiting.  Blood glucose fluctuations caused by inconsistent food absorption. DIAGNOSIS  The diagnosis of gastroparesis is confirmed through one or more of the following tests:  Barium X-rays and scans. These tests look at how long it takes for food to move through the stomach.  Gastric manometry. This test measures electrical and muscular activity in the stomach. A thin tube is passed down the throat into the stomach. The tube contains a wire that takes measurements of the stomach's electrical and muscular activity as it digests liquids and solid food.  Endoscopy. This procedure is done with a long, thin tube called an endoscope. It is passed through the mouth and gently down the esophagus into the stomach. This tube helps the caregiver look at the lining of the stomach to check for any abnormalities.  Ultrasonography. This can rule out gallbladder disease or pancreatitis. This test will outline and define the shape of the gallbladder and pancreas. TREATMENT   Treatments may include:  Exercise.  Medicines to control nausea and vomiting.  Medicines to stimulate stomach muscles.  Changes in what and when you eat.  Having smaller meals more often.  Eating low-fiber forms of high-fiber foods, such as eating cooked vegetables instead of raw vegetables.  Eating low-fat foods.  Consuming liquids, which are easier to digest.  In severe cases, feeding tubes and intravenous (IV) feeding may be needed. It is important to note that in most cases, treatment does not cure gastroparesis. It is usually a lasting (chronic) condition. Treatment helps you manage the underlying condition so that you can be as healthy and comfortable as possible. Other treatments  A gastric neurostimulator has been developed to assist people with gastroparesis. The battery-operated device is surgically implanted. It emits mild electrical pulses to help improve stomach emptying and to control nausea and  vomiting.  The use of botulinum toxin has been shown to improve stomach emptying by decreasing the prolonged contractions of the muscle between the stomach and the small intestine (pyloric sphincter). The benefits are temporary. SEEK MEDICAL CARE IF:   You have diabetes and you are having problems keeping your blood glucose in goal range.  You are having nausea, vomiting, bloating, or early feelings of fullness with eating.  Your symptoms do not change with a change in diet. Document Released: 12/30/2004 Document Revised: 04/26/2012 Document Reviewed: 06/08/2008 [facility] Patient Information 2014 [place], [place].

## 2013-03-21 NOTE — Telephone Encounter (Signed)
Pt called and I went over instructions below.  She is agreeable at this time.

## 2013-03-21 NOTE — Progress Notes (Signed)
Subjective:     Patient ID: Tina [name], female   DOB: [birth year], 43 y.o.   MRN: [id number]  HPI   Note: This dictation was prepared with Dragon/digital dictation along with [facility] technology. Any transcriptional errors that result from this process are unintentional.       Tina [name]  26-Jun-1970 [id number]  Patient Care Team: Anda Kraft, MD as PCP - General Sheronette Clint Bolder, MD as Consulting Physician (Obstetrics and Gynecology) Garvin Fila, MD as Consulting Physician (Neurology) [name] Craver, MD as Consulting Physician (Gastroenterology)  Procedure (Date: 02/04/2013):  POST-OPERATIVE DIAGNOSIS: Recurrent ventral wall incisional hernia   PROCEDURE: Procedure(s):  LAPAROSCOPIC LYSIS OF ADHESIONS 50min (1/2 case)  LAPAROSCOPIC ventral wall hernia repair  INSERTION OF MESH   SURGEON: Surgeon(s):  Adin Hector, MD  OR FINDINGS: Poor incorporation of central part of mesh with persistent hernia sac. Seromas. No abscess. Gram stain negative for organisms.  8 x 7 recurrent area of hernia. Outer 2/3 rim of old Physiomesh well incorporated.  Dense adhesions of small bowel to central part of mesh. Some incorporation. No fistula.  Type of repair - Laparoscopic underlay repair  Name of mesh - Bard Ventralight dual sided (polypropylene / Seprafilm)  Size of mesh - Length 33 cm, Width 25 cm  Mesh overlap - 7-10 cm  Placement of mesh - Intraperitoneal underlay repair   Anaerobic culture   Status: Final result Visible to patient: This result is viewable by the patient in [place]. Next appt: 08/15/2013 at 02:45 PM in [facility] Tina Blake, MD)           3wk ago    Specimen Description ABSCESS ABDOMEN HERNIA [place]     Special Requests PATIENT ON FOLLOWING [facility]     Gram Stain RARE WBC NO ORGANISMS SEEN Performed at [facility]    Culture NO ANAEROBES ISOLATED Performed at [facility]    Report Status  02/09/2013 FINAL     Resulting Agency [facility]   This patient returns for surgical re-evaluation.  She feels like she is fell back.  Using tramadol more often.  Using  the binder, ice/heating pad.  Occasional Tylenol.  Worsening abdominal pain and left upper quadrant and right lower quadrant.  A couple episodes of nausea and vomiting.  No constipation.  No sick contacts.  No hematochezia or melena.  No fevers or chills.  Energy level gradually getting better.  Glucose is still a challenge to control but claims that it is less than 200  Patient Active Problem List   Diagnosis Date Noted  . Diarrhea 02/28/2013  . Spastic hemiplegia affecting dominant side 02/16/2013  . Recurrent ventral incisional hernia s/p closure/repair w mesh 02/04/2013 01/26/2013  . Constipation, chronic 01/26/2013  . Abdominal pain, chronic, right lower quadrant 06/03/2012  . History of CVA (cerebrovascular accident) 06/03/2012  . HTN (hypertension) 06/03/2012  . Hx of migraines 10/19/2011  . Diabetes mellitus type II, uncontrolled 10/16/2011  . Obesity (BMI 30-39.9) 09/02/2011  . Rotator cuff tear 08/25/2011  . Sciatica 06/10/2011  . Paresthesias in right hand 06/10/2011  . Lumbago 05/09/2011  . Paresthesias 05/09/2011    Past Medical History  Diagnosis Date  . Spastic hemiplegia affecting dominant side   . Panic disorder without agoraphobia   . Depression   . Anxiety   . Diabetes mellitus   . Hypertension   . Blood transfusion     IN 2012  AFTER C -SECTION  . Cerebral thrombosis  with cerebral infarction JUNE 2011    RIGHT SIDED WEAKNESS ( ARM AND LEG ) AND SPASMS  . Stroke   . Right rotator cuff tear     PAIN IN RIGHT SHOULDER  . Ventral hernia     RIGHT LOWER QUADRANT-CAUSING SOME PAIN  . Anemia     DURING MENSES--HAS HEAVY BLEEDING WITH PERODS  . Headache(784.0)     MIGRAINES--NOT REALLY HEADACHE-MORE LIKE PRESSURE SENSATION IN HEAD-FEELS DIZZIY AND  FAINT AS THE PRESSURE RESOLVES  . Diabetic  neuropathy     BOTH FEET --COMES AND GOES  . Restless leg syndrome     DIAGNOSED BY SLEEP STUDY - PT TOLD SHE DID NOT HAVE SLEEP APNEA  . Rash     HANDS, ARMS --STATES HX OF RASH EVER SINCE CHILDBIRTH/PREGNANCY.  STATES THE RASH OFTEN OCCURS WHEN SHE IS REALLY STRESSED.  Marland Kitchen Hernia, incisional, RLQ, s/p lap repair Sep 2013 09/02/2011  . SBO (small bowel obstruction) 06/03/2012  . Back pain, chronic   . Leg pain, right     Past Surgical History  Procedure Laterality Date  . Cesarean section  2012  . Uterine fibroid surgery      2 SURGERIES FOR FIBROIDS  . Ureter revision    . Ventral hernia repair  10/03/2011    Procedure: LAPAROSCOPIC VENTRAL HERNIA;  Surgeon: Adin Hector, MD;  Location: [facility];  Service: General;  Laterality: N/A;  . Hernia repair  10/03/11    ventral hernia repair  . Esophagogastroduodenoscopy N/A 06/03/2012    Procedure: ESOPHAGOGASTRODUODENOSCOPY (EGD);  Surgeon: [name] Craver, MD;  Location[facility];  Service: Endoscopy;  Laterality: N/A;  . Diagnostic laparoscopy    . Umbilical hernia repair N/A 02/04/2013    Procedure: LAPAROSCOPIC ventral wall hernia repair LAPAROSCOPIC LYSIS OF ADHESIONS laparoscopic exploration of abdomen ;  Surgeon: Adin Hector, MD;  Location: [facility];  Service: General;  Laterality: N/A;  . Insertion of mesh N/A 02/04/2013    Procedure: INSERTION OF MESH;  Surgeon: Adin Hector, MD;  Location: [facility];  Service: General;  Laterality: N/A;    History   Social History  . Marital Status: Married    Spouse Name: N/A    Number of Children: N/A  . Years of Education: N/A   Occupational History  . Not on file.   Social History Main Topics  . Smoking status: Never Smoker   . Smokeless tobacco: Never Used  . Alcohol Use: No  . Drug Use: No  . Sexual Activity: Yes    Birth Control/ Protection: None   Other Topics Concern  . Not on file   Social History Narrative  . No narrative on file    Family History  Problem Relation  Age of Onset  . Diabetes Father   . Kidney disease Father   . Other Neg Hx   . Diabetes Maternal Grandmother   . Hyperlipidemia Paternal Grandmother   . Stroke Paternal Grandmother     Current Outpatient Prescriptions  Medication Sig Dispense Refill  . Amlodipine-Valsartan-HCTZ 10-320-25 MG TABS Take 1 tablet by mouth every evening.       Marland Kitchen APIDRA 100 UNIT/ML injection       . aspirin 325 MG tablet Take 325 mg by mouth daily.      . B-D ULTRAFINE III SHORT PEN 31G X 8 MM MISC       . BYSTOLIC 10 MG tablet Take 10 mg by mouth every morning.       Marland Kitchen  diazepam (VALIUM) 5 MG tablet       . diclofenac sodium (VOLTAREN) 1 % GEL Apply 2 g topically 4 (four) times daily.      . furosemide (LASIX) 40 MG tablet Take 20-40 mg by mouth daily as needed for fluid or edema.       . gabapentin (NEURONTIN) 300 MG capsule       . insulin glargine (LANTUS) 100 UNIT/ML injection Inject 22 Units into the skin 2 (two) times daily.       . insulin lispro (HUMALOG) 100 UNIT/ML injection Inject 10-15 Units into the skin 3 (three) times daily before meals.       . INVOKANA 300 MG TABS Take 300 mg by mouth every morning.       . Multiple Vitamin (MULTIVITAMIN WITH MINERALS) TABS Take 1 tablet by mouth daily.      Marland Kitchen tiZANidine (ZANAFLEX) 4 MG tablet Take 1 tablet (4 mg total) by mouth 2 (two) times daily.  60 tablet  1  . traMADol (ULTRAM) 50 MG tablet Take 1-2 tablets (50-100 mg total) by mouth every 6 (six) hours as needed.  50 tablet  0   No current facility-administered medications for this visit.     Allergies  Allergen Reactions  . Contrast Media [Iodinated Diagnostic Agents] Other (See Comments)    Difficulty breathing  . Iohexol Hives, Nausea And Vomiting and Swelling     Desc: Magnevist-gadolinium-difficulty breathing, throat swelling   . Midazolam Hcl Anaphylaxis    Difficulty breathing  . Shellfish Allergy Anaphylaxis  . Avandia [Rosiglitazone Maleate]     Patient doesn't remember reaction  .  Geodon [Ziprasidone]   . Kiwi Extract Itching and Swelling  . Metformin And Related Nausea And Vomiting and Other (See Comments)    diarrhea  . Other Itching    Patient is allergic to all nuts except peanuts.     BP 128/82  Pulse 72  Temp(Src) 98.2 F (36.8 C)  Resp 14  Ht 5\' 6"  (1.676 m)  Wt 242 lb 9.6 oz (110.043 kg)  BMI 39.18 kg/m2  No results found.   Review of Systems  Constitutional: Negative for fever, chills and diaphoresis.  HENT: Negative for ear pain, sore throat and trouble swallowing.   Eyes: Negative for photophobia and visual disturbance.  Respiratory: Negative for cough and choking.   Cardiovascular: Negative for chest pain and palpitations.  Gastrointestinal: Positive for abdominal pain. Negative for nausea, vomiting, diarrhea, constipation, anal bleeding and rectal pain.  Genitourinary: Negative for dysuria, frequency and difficulty urinating.  Musculoskeletal: Negative for gait problem and myalgias.  Skin: Negative for color change, pallor and rash.  Neurological: Negative for dizziness, speech difficulty, weakness and numbness.  Hematological: Negative for adenopathy.  Psychiatric/Behavioral: Negative for confusion and agitation. The patient is not nervous/anxious.        Objective:   Physical Exam  Constitutional: She is oriented to person, place, and time. She appears well-developed and well-nourished. No distress.  HENT:  Head: Normocephalic.  Mouth/Throat: Oropharynx is clear and moist. No oropharyngeal exudate.  Eyes: Conjunctivae and EOM are normal. Pupils are equal, round, and reactive to light. No scleral icterus.  Neck: Normal range of motion. No tracheal deviation present.  Cardiovascular: Normal rate and intact distal pulses.   Pulmonary/Chest: Effort normal. No respiratory distress. She exhibits no tenderness.  Abdominal: Soft. She exhibits no distension. There is no tenderness. Hernia confirmed negative in the right inguinal area and  confirmed negative in the left  inguinal area.  Incisions clean with normal healing ridges.  No hernias  Genitourinary: No vaginal discharge found.  Musculoskeletal: Normal range of motion. She exhibits no tenderness.  Lymphadenopathy:       Right: No inguinal adenopathy present.       Left: No inguinal adenopathy present.  Neurological: She is alert and oriented to person, place, and time. No cranial nerve deficit. She exhibits normal muscle tone. Coordination normal.  Skin: Skin is warm and dry. No rash noted. She is not diaphoretic.  Psychiatric: She has a normal mood and affect. Her behavior is normal.       Assessment:     Step backward in a morbidly obese and diabetic patient with neurological issues status post laparoscopic repair of a giant hernia recurrence and her abdomen     Plan:     CT scan and to rule out recurrent hernia, abscess, or obstruction.  Control of nausea.  Better in for now.  I suspect she would benefit from gastroenterology evaluation with gastric emptying study to rule out delayed gastric emptying in this diabetic patient with neurological problems.  However, with her on narcotics the test will be less accurate.  See if her pain will improve over the next few weeks to have less narcotics on board.  Then obtain the study  Decrease activity x 1 week & then increase activity as tolerated to regular activity.  Low impact exercise such as walking an hour a day at least ideal.  Do not push through pain.  Improve pain control.  Tylenol 4 times a day.  Oxycodone.    DM control  Diet as tolerated.  Low fat high fiber diet ideal.  Bowel regimen with 30 g fiber a day and fiber supplement as needed to avoid problems.  Cut back on the Metamucil for the next week.   Come back and see me in 3 weeks to make sure that she is improving, sooner as needed.   Instructions discussed.  Followup with primary care physician for other health issues as would normally be done.  Consider  screening for malignancies (breast, prostate, colon, melanoma, etc) as appropriate.  Questions answered.  The patient expressed understanding and appreciation

## 2013-03-21 NOTE — Telephone Encounter (Signed)
I was calling to inform pt of her appt for her CT scan, she is currently driving and will call back when she is able to write down instructions.  She is scheduled for her CT scan at [facility] on 03/22/13 with an arrival time of 3:45pm. She will drink the 1st bottle of contrast at 2:00pm and the 2nd bottle at 3:00pm.  NO solid foods 4 hours prior to the scan.

## 2013-03-22 ENCOUNTER — Ambulatory Visit
Admission: RE | Admit: 2013-03-22 | Discharge: 2013-03-22 | Disposition: A | Discharge status: Home / Self Care | Payer: Federal, State, Local not specified - PPO | Source: Ambulatory Visit | Attending: Surgery | Admitting: Surgery

## 2013-03-22 DIAGNOSIS — K7689 Other specified diseases of liver: Secondary | ICD-10-CM | POA: Diagnosis not present

## 2013-03-22 DIAGNOSIS — R112 Nausea with vomiting, unspecified: Secondary | ICD-10-CM

## 2013-03-22 DIAGNOSIS — K5909 Other constipation: Secondary | ICD-10-CM

## 2013-03-22 DIAGNOSIS — D259 Leiomyoma of uterus, unspecified: Secondary | ICD-10-CM | POA: Diagnosis not present

## 2013-03-22 DIAGNOSIS — K432 Incisional hernia without obstruction or gangrene: Secondary | ICD-10-CM

## 2013-03-23 ENCOUNTER — Telehealth (INDEPENDENT_AMBULATORY_CARE_PROVIDER_SITE_OTHER): Payer: Self-pay | Admitting: General Surgery

## 2013-03-23 NOTE — Telephone Encounter (Signed)
Pt called for CT results; given the essentially negative report.  Pt not sure if she needs to be seen again in clinic or not.  Please advise: [phone].

## 2013-03-25 NOTE — Telephone Encounter (Signed)
LMOM returning pt's call. Dr Johney Maine reviewed the CT scan and the results show no bowel obstruction,recurrent hernia. There is a small postoperative fluid collection not concerning.No abscess. Reassuring overall. We just need to make sure she gets good control of her musculoskeletal pain and nausea under control until her symptoms resolve per Dr Johney Maine.

## 2013-05-04 ENCOUNTER — Ambulatory Visit (INDEPENDENT_AMBULATORY_CARE_PROVIDER_SITE_OTHER): Payer: Federal, State, Local not specified - PPO | Admitting: Surgery

## 2013-05-04 ENCOUNTER — Encounter (INDEPENDENT_AMBULATORY_CARE_PROVIDER_SITE_OTHER): Payer: Self-pay | Admitting: Surgery

## 2013-05-04 VITALS — BP 150/80 | HR 84 | Temp 98.2°F | Resp 16 | Ht 66.0 in | Wt 241.4 lb

## 2013-05-04 DIAGNOSIS — E669 Obesity, unspecified: Secondary | ICD-10-CM

## 2013-05-04 DIAGNOSIS — G8929 Other chronic pain: Secondary | ICD-10-CM

## 2013-05-04 DIAGNOSIS — K432 Incisional hernia without obstruction or gangrene: Secondary | ICD-10-CM

## 2013-05-04 DIAGNOSIS — R1031 Right lower quadrant pain: Secondary | ICD-10-CM

## 2013-05-04 DIAGNOSIS — IMO0002 Reserved for concepts with insufficient information to code with codable children: Secondary | ICD-10-CM

## 2013-05-04 NOTE — Patient Instructions (Addendum)
Please consider the recommendations that we have given you today:  Consider drainage of the fluid collection in the right lower abdomen (seroma).  You will need a drain to help encourage the seroma fluid pocket to heal.  Hopefully that will help the mesh he will and seal the hernia repair.  Return to see me within a few weeks from having a drain placed.  See the Handout(s) we have given you.  Please call our office at [phone] if you wish to schedule surgery or if you have further questions / concerns.   Seroma A seroma is a collection of fluid that looks like swelling or a mass on the body. Seromas form on the body where tissue has been injured or cut. They are most common after surgeries. Seromas vary in size. Some are small and painless. Others may become large and cause pain or discomfort. Many seromas go away on their own; the fluid is naturally absorbed by the body. Some may require the fluid to be drained through medical procedures.  CAUSES  Seromas form as the result of damage to tissue or the removal of tissue. This tissue damage may occur during surgery or because of an injury or trauma. When tissue is disrupted or removed, empty space is created. The body's natural defense system causes fluid to enter the empty space and form a seroma. SYMPTOMS   Swelling at the site of a surgical cut (incision) or an injury.  Drainage of clear fluid at the surgery or injury site.  Possible discomfort or pain. DIAGNOSIS  Your caregiver will perform a physical exam. During the exam, the caregiver will press on the seroma using a hand or fingers (palpation). Various tests may be ordered to help confirm the diagnosis. These tests may include:  Blood tests.  Imaging tests such as ultrasonography or computed tomography (CT). TREATMENT  Sometimes seromas resolve on their own and drain naturally in the body. Your caregiver may monitor you to make sure the seroma does not cause any  complications. If your seroma does not resolve on its own, treatment may include:  Using a needle to drain the fluid from the seroma (needle aspiration).  Inserting a flexible tube (catheter) to drain the fluid.  Applying a dressing, such as an elastic bandage or binder.  Use of antibiotic medicines if the seroma becomes infected.  In rare cases, surgery may be done to remove the seroma and repair the area. HOME CARE INSTRUCTIONS  Follow your caregiver's instructions regarding activity levels and any limitations on movements.  Only take over-the-counter or prescription medicines as directed by your caregiver.  If your caregiver prescribes antibiotics, take them as directed. Finish them even if you start to feel better.  Check your seroma every day for redness, warmth, or yellow drainage.  Follow up with your caregiver as directed. SEEK MEDICAL CARE IF:  You develop a fever.  You have pain, tenderness, redness, or warmth at the site of the seroma.  You notice yellow drainage coming from the site of the seroma.  Your seroma is getting bigger. Document Released: 04/26/2012 Document Reviewed: 04/26/2012 [facility] Patient Information 2014 [place], [place].  DRAIN CARE:   You have a closed bulb drain to help you heal.  A bulb drain is a small, plastic reservoir which creates a gentle suction. It is used to remove excess fluid from a surgical wound. The color and amount of fluid will vary. Immediately after surgery, the fluid is bright red. It may gradually change  to a yellow color. When the amount decreases to about 1 or 2 tablespoons (15 to 30 cc) per 24 hours, your caregiver will usually remove it.  DAILY CARE  Keep the bulb compressed at all times, except while emptying it. The compression creates suction.   Keep sites where the tubes enter the skin dry and covered with a light bandage (dressing).   Tape the tubes to your skin, 1 to 2 inches below the insertion sites, to keep  from pulling on your stitches. Tubes are stitched in place and will not slip out.   Pin the bulb to your shirt (not to your pants) with a safety pin.   For the first few days after surgery, there usually is more fluid in the bulb. Empty the bulb whenever it becomes half full because the bulb does not create enough suction if it is too full. Include this amount in your 24 hour totals.   When the amount of drainage decreases, empty the bulb at the same time every day. Write down the amounts and the 24 hour totals. Your caregiver will want to know them. This helps your caregiver know when the tubes can be removed.   (We anticipate removing the drain in 1-3 weeks, depending on when the output is <15mL a day for 2+ days)  If there is drainage around the tube sites, change dressings and keep the area dry. If you see a clot in the tube, leave it alone. However, if the tube does not appear to be draining, let your caregiver know.  TO EMPTY THE BULB  Open the stopper to release suction.   Holding the stopper out of the way, pour drainage into the measuring cup that was sent home with you.   Measure and write down the amount. If there are 2 bulbs, note the amount of drainage from bulb 1 or bulb 2 and keep the totals separate. Your caregiver will want to know which tube is draining more.   Compress the bulb by folding it in half.   Replace the stopper.   Check the tape that holds the tube to your skin, and pin the bulb to your shirt.  SEEK MEDICAL CARE IF:  The drainage develops a bad odor.   You have an oral temperature above 102 F (38.9 C).   The amount of drainage from your wound suddenly increases or decreases.   You accidentally pull out your drain.   You have any other questions or concerns.  MAKE SURE YOU:   Understand these instructions.   Will watch your condition.   Will get help right away if you are not doing well or get worse.     Call our office if you have any  questions about your drain. [phone]  Managing Pain  Pain after surgery or related to activity is often due to strain/injury to muscle, tendon, nerves and/or incisions.  This pain is usually short-term and will improve in a few months.   Many people find it helpful to do the following things TOGETHER to help speed the process of healing and to get back to regular activity more quickly:  1. Avoid heavy physical activity a.  no lifting greater than 20 pounds b. Do not "push through" the pain.  Listen to your body and avoid positions and maneuvers than reproduce the pain c. Walking is okay as tolerated, but go slowly and stop when getting sore.  d. Remember: If it hurts to do it, then don't  do it! 2. Take Anti-inflammatory medication  a. Take with food/snack around the clock for 1-2 weeks i. This helps the muscle and nerve tissues become less irritable and calm down faster b. Choose ONE of the following over-the-counter medications: i. Naproxen 220mg  tabs (ex. Aleve) 1-2 pills twice a day  ii. Ibuprofen 200mg  tabs (ex. Advil, Motrin) 3-4 pills with every meal and just before bedtime iii. Acetaminophen 500mg  tabs (Tylenol) 1-2 pills with every meal and just before bedtime 3. Use a Heating pad or Ice/Cold Pack a. 4-6 times a day b. May use warm bath/hottub  or showers 4. Try Gentle Massage and/or Stretching  a. at the area of pain many times a day b. stop if you feel pain - do not overdo it  Try these steps together to help you body heal faster and avoid making things get worse.  Doing just one of these things may not be enough.    If you are not getting better after two weeks or are noticing you are getting worse, contact our office for further advice; we may need to re-evaluate you & see what other things we can do to help.  GETTING TO GOOD BOWEL HEALTH. Irregular bowel habits such as constipation and diarrhea can lead to many problems over time.  Having one soft bowel movement a day  is the most important way to prevent further problems.  The anorectal canal is designed to handle stretching and feces to safely manage our ability to get rid of solid waste (feces, poop, stool) out of our body.  BUT, hard constipated stools can act like ripping concrete bricks and diarrhea can be a burning fire to this very sensitive area of our body, causing inflamed hemorrhoids, anal fissures, increasing risk is perirectal abscesses, abdominal pain/bloating, an making irritable bowel worse.     The goal: ONE SOFT BOWEL MOVEMENT A DAY!  To have soft, regular bowel movements:    Drink at least 8 tall glasses of water a day.     Take plenty of fiber.  Fiber is the undigested part of plant food that passes into the colon, acting s "natures broom" to encourage bowel motility and movement.  Fiber can absorb and hold large amounts of water. This results in a larger, bulkier stool, which is soft and easier to pass. Work gradually over several weeks up to 6 servings a day of fiber (25g a day even more if needed) in the form of: o Vegetables -- Root (potatoes, carrots, turnips), leafy green (lettuce, salad greens, celery, spinach), or cooked high residue (cabbage, broccoli, etc) o Fruit -- Fresh (unpeeled skin & pulp), Dried (prunes, apricots, cherries, etc ),  or stewed ( applesauce)  o Whole grain breads, pasta, etc (whole wheat)  o Bran cereals    Bulking Agents -- This type of water-retaining fiber generally is easily obtained each day by one of the following:  o Psyllium bran -- The psyllium plant is remarkable because its ground seeds can retain so much water. This product is available as Metamucil, Konsyl, Effersyllium, Per Diem Fiber, or the less expensive generic preparation in drug and health food stores. Although labeled a laxative, it really is not a laxative.  o Methylcellulose -- This is another fiber derived from wood which also retains water. It is available as Citrucel. o Polyethylene Glycol -  and "artificial" fiber commonly called Miralax or Glycolax.  It is helpful for people with gassy or bloated feelings with regular fiber o Flax Seed -  a less gassy fiber than psyllium   No reading or other relaxing activity while on the toilet. If bowel movements take longer than 5 minutes, you are too constipated   AVOID CONSTIPATION.  High fiber and water intake usually takes care of this.  Sometimes a laxative is needed to stimulate more frequent bowel movements, but    Laxatives are not a good long-term solution as it can wear the colon out. o Osmotics (Milk of Magnesia, Fleets phosphosoda, Magnesium citrate, MiraLax, GoLytely) are safer than  o Stimulants (Senokot, Castor Oil, Dulcolax, Ex Lax)    o Do not take laxatives for more than 7days in a row.    IF SEVERELY CONSTIPATED, try a Bowel Retraining Program: o Do not use laxatives.  o Eat a diet high in roughage, such as bran cereals and leafy vegetables.  o Drink six (6) ounces of prune or apricot juice each morning.  o Eat two (2) large servings of stewed fruit each day.  o Take one (1) heaping tablespoon of a psyllium-based bulking agent twice a day. Use sugar-free sweetener when possible to avoid excessive calories.  o Eat a normal breakfast.  o Set aside 15 minutes after breakfast to sit on the toilet, but do not strain to have a bowel movement.  o If you do not have a bowel movement by the third day, use an enema and repeat the above steps.    Controlling diarrhea o Switch to liquids and simpler foods for a few days to avoid stressing your intestines further. o Avoid dairy products (especially milk & ice cream) for a short time.  The intestines often can lose the ability to digest lactose when stressed. o Avoid foods that cause gassiness or bloating.  Typical foods include beans and other legumes, cabbage, broccoli, and dairy foods.  Every person has some sensitivity to other foods, so listen to our body and avoid those foods that  trigger problems for you. o Adding fiber (Citrucel, Metamucil, psyllium, Miralax) gradually can help thicken stools by absorbing excess fluid and retrain the intestines to act more normally.  Slowly increase the dose over a few weeks.  Too much fiber too soon can backfire and cause cramping & bloating. o Probiotics (such as active yogurt, Align, etc) may help repopulate the intestines and colon with normal bacteria and calm down a sensitive digestive tract.  Most studies show it to be of mild help, though, and such products can be costly. o Medicines:   Bismuth subsalicylate (ex. Kayopectate, Pepto Bismol) every 30 minutes for up to 6 doses can help control diarrhea.  Avoid if pregnant.   Loperamide (Immodium) can slow down diarrhea.  Start with two tablets (4mg  total) first and then try one tablet every 6 hours.  Avoid if you are having fevers or severe pain.  If you are not better or start feeling worse, stop all medicines and call your doctor for advice o Call your doctor if you are getting worse or not better.  Sometimes further testing (cultures, endoscopy, X-ray studies, bloodwork, etc) may be needed to help diagnose and treat the cause of the diarrhea. o

## 2013-05-04 NOTE — Progress Notes (Signed)
Subjective:     Patient ID: Tina [name], female   DOB: [birth year], 43 y.o.   MRN: [id number]  HPI   Note: This dictation was prepared with Dragon/digital dictation along with [facility] technology. Any transcriptional errors that result from this process are unintentional.       Zerah SOKHA CRAKER  03/27/1970 [id number]  Patient Care Team: Anda Kraft, MD as PCP - General Sheronette Clint Bolder, MD as Consulting Physician (Obstetrics and Gynecology) Garvin Fila, MD as Consulting Physician (Neurology) [name] Craver, MD as Consulting Physician (Gastroenterology)  Procedure (Date: 02/04/2013):  POST-OPERATIVE DIAGNOSIS: Recurrent ventral wall incisional hernia   PROCEDURE: Procedure(s):  LAPAROSCOPIC LYSIS OF ADHESIONS 24min (1/2 case)  LAPAROSCOPIC ventral wall hernia repair  INSERTION OF MESH   SURGEON: Surgeon(s):  Adin Hector, MD  OR FINDINGS: Poor incorporation of central part of mesh with persistent hernia sac. Seromas. No abscess. Gram stain negative for organisms.  8 x 7 recurrent area of hernia. Outer 2/3 rim of old Physiomesh well incorporated.  Dense adhesions of small bowel to central part of mesh. Some incorporation. No fistula.  Type of repair - Laparoscopic underlay repair  Name of mesh - Bard Ventralight dual sided (polypropylene / Seprafilm)  Size of mesh - Length 33 cm, Width 25 cm  Mesh overlap - 7-10 cm  Placement of mesh - Intraperitoneal underlay repair   Anaerobic culture   Status: Final result Visible to patient: This result is viewable by the patient in [place]. Next appt: 08/15/2013 at 02:45 PM in Physical Medicine and Rehabilitation [name], MD)           3wk ago    Specimen Description ABSCESS ABDOMEN HERNIA [place]     Special Requests PATIENT ON FOLLOWING FLAGYL     Gram Stain RARE WBC NO ORGANISMS SEEN Performed at [facility]    Culture NO ANAEROBES ISOLATED Performed at [facility]    Report Status  02/09/2013 FINAL     Resulting Agency [facility]   This patient returns for surgical re-evaluation.  She is worried that she has a recurrent hernia in the right lower quadrant.  The left upper quadrant abdominal pain is gone which is an improvement.  Trying to keep her glucose is under better control.  No fevers or chills.  Still sternums with right lower quadrant abdominal wall pain.  Oxycodone is too sedating.  Neurontin sedating so she can only take it at night..  Tramadol not quite enough but makes it more tolerable.  Eating well.  No nausea or vomiting.  No fevers or chills.  Struggles with irregular bowels with occasional constipation and diarrhea. She is worried she has a hernia coming back.  Patient Active Problem List   Diagnosis Date Noted  . Nausea with vomiting ?Gastroparesis? 03/21/2013  . Spastic hemiplegia affecting dominant side 02/16/2013  . Recurrent ventral incisional hernia s/p closure/repair w mesh 02/04/2013 01/26/2013  . Constipation, chronic 01/26/2013  . Abdominal pain, chronic, right lower quadrant 06/03/2012  . History of CVA (cerebrovascular accident) 06/03/2012  . HTN (hypertension) 06/03/2012  . Hx of migraines 10/19/2011  . Diabetes mellitus type II, uncontrolled 10/16/2011  . Obesity (BMI 30-39.9) 09/02/2011  . Rotator cuff tear 08/25/2011  . Sciatica 06/10/2011  . Paresthesias in right hand 06/10/2011  . Lumbago 05/09/2011  . Paresthesias 05/09/2011    Past Medical History  Diagnosis Date  . Spastic hemiplegia affecting dominant side   . Panic disorder without agoraphobia   . Depression   .  Anxiety   . Diabetes mellitus   . Hypertension   . Blood transfusion     IN 2012  AFTER C -SECTION  . Cerebral thrombosis with cerebral infarction JUNE 2011    RIGHT SIDED WEAKNESS ( ARM AND LEG ) AND SPASMS  . Stroke   . Right rotator cuff tear     PAIN IN RIGHT SHOULDER  . Ventral hernia     RIGHT LOWER QUADRANT-CAUSING SOME PAIN  . Anemia     DURING  MENSES--HAS HEAVY BLEEDING WITH PERODS  . Headache(784.0)     MIGRAINES--NOT REALLY HEADACHE-MORE LIKE PRESSURE SENSATION IN HEAD-FEELS DIZZIY AND  FAINT AS THE PRESSURE RESOLVES  . Diabetic neuropathy     BOTH FEET --COMES AND GOES  . Restless leg syndrome     DIAGNOSED BY SLEEP STUDY - PT TOLD SHE DID NOT HAVE SLEEP APNEA  . Rash     HANDS, ARMS --STATES HX OF RASH EVER SINCE CHILDBIRTH/PREGNANCY.  STATES THE RASH OFTEN OCCURS WHEN SHE IS REALLY STRESSED.  Marland Kitchen Hernia, incisional, RLQ, s/p lap repair Sep 2013 09/02/2011  . SBO (small bowel obstruction) 06/03/2012  . Back pain, chronic   . Leg pain, right     Past Surgical History  Procedure Laterality Date  . Cesarean section  2012  . Uterine fibroid surgery      2 SURGERIES FOR FIBROIDS  . Ureter revision    . Ventral hernia repair  10/03/2011    Procedure: LAPAROSCOPIC VENTRAL HERNIA;  Surgeon: Adin Hector, MD;  Location: [facility];  Service: General;  Laterality: N/A;  . Hernia repair  10/03/11    ventral hernia repair  . Esophagogastroduodenoscopy N/A 06/03/2012    Procedure: ESOPHAGOGASTRODUODENOSCOPY (EGD);  Surgeon: [name] Craver, MD;  Location: [facility] ENDOSCOPY;  Service: Endoscopy;  Laterality: N/A;  . Diagnostic laparoscopy    . Umbilical hernia repair N/A 02/04/2013    Procedure: LAPAROSCOPIC ventral wall hernia repair LAPAROSCOPIC LYSIS OF ADHESIONS laparoscopic exploration of abdomen ;  Surgeon: Adin Hector, MD;  Location: [facility];  Service: General;  Laterality: N/A;  . Insertion of mesh N/A 02/04/2013    Procedure: INSERTION OF MESH;  Surgeon: Adin Hector, MD;  Location: [facility];  Service: General;  Laterality: N/A;    History   Social History  . Marital Status: Married    Spouse Name: N/A    Number of Children: N/A  . Years of Education: N/A   Occupational History  . Not on file.   Social History Main Topics  . Smoking status: Never Smoker   . Smokeless tobacco: Never Used  . Alcohol Use: No  . Drug Use: No    . Sexual Activity: Yes    Birth Control/ Protection: None   Other Topics Concern  . Not on file   Social History Narrative  . No narrative on file    Family History  Problem Relation Age of Onset  . Diabetes Father   . Kidney disease Father   . Other Neg Hx   . Diabetes Maternal Grandmother   . Hyperlipidemia Paternal Grandmother   . Stroke Paternal Grandmother     Current Outpatient Prescriptions  Medication Sig Dispense Refill  . Amlodipine-Valsartan-HCTZ 10-320-25 MG TABS Take 1 tablet by mouth every evening.       Marland Kitchen APIDRA 100 UNIT/ML injection       . aspirin 325 MG tablet Take 325 mg by mouth daily.      . B-D ULTRAFINE III  SHORT PEN 31G X 8 MM MISC       . BYSTOLIC 10 MG tablet Take 10 mg by mouth every morning.       . furosemide (LASIX) 40 MG tablet Take 20-40 mg by mouth daily as needed for fluid or edema.       . gabapentin (NEURONTIN) 300 MG capsule       . insulin glargine (LANTUS) 100 UNIT/ML injection Inject 22 Units into the skin 2 (two) times daily.       . insulin lispro (HUMALOG) 100 UNIT/ML injection Inject 10-15 Units into the skin 3 (three) times daily before meals.       . INVOKANA 300 MG TABS Take 300 mg by mouth every morning.       . Multiple Vitamin (MULTIVITAMIN WITH MINERALS) TABS Take 1 tablet by mouth daily.      . promethazine (PHENERGAN) 12.5 MG tablet Take 1-2 tablets (12.5-25 mg total) by mouth every 6 (six) hours as needed for nausea.  20 tablet  3  . tiZANidine (ZANAFLEX) 4 MG tablet Take 1 tablet (4 mg total) by mouth 2 (two) times daily.  60 tablet  1  . traMADol (ULTRAM) 50 MG tablet Take by mouth every 6 (six) hours as needed.       No current facility-administered medications for this visit.     Allergies  Allergen Reactions  . Contrast Media [Iodinated Diagnostic Agents] Other (See Comments)    Difficulty breathing  . Iohexol Hives, Nausea And Vomiting and Swelling     Desc: Magnevist-gadolinium-difficulty breathing, throat  swelling   . Midazolam Hcl Anaphylaxis    Difficulty breathing  . Shellfish Allergy Anaphylaxis  . Avandia [Rosiglitazone Maleate]     Patient doesn't remember reaction  . Geodon [Ziprasidone]   . Kiwi Extract Itching and Swelling  . Metformin And Related Nausea And Vomiting and Other (See Comments)    diarrhea  . Other Itching    Patient is allergic to all nuts except peanuts.     BP 150/80  Pulse 84  Temp(Src) 98.2 F (36.8 C)  Resp 16  Ht 5\' 6"  (1.676 m)  Wt 241 lb 6.4 oz (109.498 kg)  BMI 38.98 kg/m2  No results found.   Review of Systems  Constitutional: Negative for fever, chills and diaphoresis.  HENT: Negative for ear pain, sore throat and trouble swallowing.   Eyes: Negative for photophobia and visual disturbance.  Respiratory: Negative for cough and choking.   Cardiovascular: Negative for chest pain and palpitations.  Gastrointestinal: Positive for abdominal pain. Negative for nausea, vomiting, diarrhea, constipation, anal bleeding and rectal pain.  Genitourinary: Negative for dysuria, frequency and difficulty urinating.  Musculoskeletal: Negative for gait problem and myalgias.  Skin: Negative for color change, pallor and rash.  Neurological: Negative for dizziness, speech difficulty, weakness and numbness.  Hematological: Negative for adenopathy.  Psychiatric/Behavioral: Negative for confusion and agitation. The patient is not nervous/anxious.        Objective:   Physical Exam  Constitutional: She is oriented to person, place, and time. She appears well-developed and well-nourished. No distress.  HENT:  Head: Normocephalic.  Mouth/Throat: Oropharynx is clear and moist. No oropharyngeal exudate.  Eyes: Conjunctivae and EOM are normal. Pupils are equal, round, and reactive to light. No scleral icterus.  Neck: Normal range of motion. No tracheal deviation present.  Cardiovascular: Normal rate and intact distal pulses.   Pulmonary/Chest: Effort normal. No  respiratory distress. She exhibits no tenderness.  Abdominal: Soft.  She exhibits no distension. There is tenderness in the left lower quadrant. There is no rigidity, no rebound, no guarding, no tenderness at McBurney's point and negative Murphy's sign. No hernia. Hernia confirmed negative in the ventral area, confirmed negative in the right inguinal area and confirmed negative in the left inguinal area.    Incisions clean with normal healing ridges.  No hernias  Genitourinary: No vaginal discharge found.  Musculoskeletal: Normal range of motion. She exhibits no tenderness.  Lymphadenopathy:       Right: No inguinal adenopathy present.       Left: No inguinal adenopathy present.  Neurological: She is alert and oriented to person, place, and time. No cranial nerve deficit. She exhibits normal muscle tone. Coordination normal.  Skin: Skin is warm and dry. No rash noted. She is not diaphoretic.  Psychiatric: She has a normal mood and affect. Her behavior is normal.       Assessment:     Some improvement but concerning for persistent right lower quadrant abdominal pass with worsening pain in a morbidly obese and diabetic patient with neurological issues status post laparoscopic repair of a giant hernia recurrence and her abdomen     Plan:     CT scan shows no evidence of hernia recurrence but a deep seroma.  Normally these resolve over time.  However, because she is worried he has gotten larger and more symptomatic, I think she would benefit from radiographically guided aspiration and probable drain placement.  I suspect she has a persistent seroma between the meshes of the repair.  It is a challenge for the meshes to adhere & heal.  She had a persistent seroma on the repeat surgery which makes me concerned that this seroma will prevent the mesh from working.  Normally I do not like to do this but it has been 3 months and she still has a persistent fluid collection that is more symptomatic (Which  makes me think it is getting larger).  She has plenty of overlap from the giant sheet of mesh I used, so her her current is unlikely.  Probably can just do this under ultrasound guidance.  Radiology will get her to determine which form of evaluation CT vs. Ultrasound it is safer.  Would be helpful to aspirate fluid & make sure there is no infection.  Hopefully the drain will just need to be in there for a few weeks to allow the seromatous finally resolved.and to rule out recurrent hernia, abscess, or obstruction.  I suspect she would benefit from gastroenterology evaluation with gastric emptying study to rule out delayed gastric emptying in this diabetic patient with neurological problems.  However, with her on narcotics the test will be less accurate.  See if her pain will improve over the next few weeks to have less narcotics on board.  Then obtain the study  Decrease activity x 1 week & then increase activity as tolerated to regular activity.  Low impact exercise such as walking an hour a day at least ideal.  Do not push through pain.  Improve pain control.  Tylenol 4 times a day.  Oxycodone.    DM control  Diet as tolerated.  Low fat high fiber diet ideal.  Bowel regimen with 30 g fiber a day and fiber supplement as needed to avoid problems.  Cut back on the Metamucil for the next week.   Come back and see me in 3 weeks to make sure that she is improving, sooner as needed.  Instructions discussed.  Followup with primary care physician for other health issues as would normally be done.  Consider screening for malignancies (breast, prostate, colon, melanoma, etc) as appropriate.  Questions answered.  The patient expressed understanding and appreciation

## 2013-05-06 ENCOUNTER — Other Ambulatory Visit: Payer: Self-pay | Admitting: Radiology

## 2013-05-09 ENCOUNTER — Encounter (HOSPITAL_COMMUNITY): Payer: Self-pay

## 2013-05-09 ENCOUNTER — Ambulatory Visit (HOSPITAL_COMMUNITY)
Admission: RE | Admit: 2013-05-09 | Discharge: 2013-05-09 | Disposition: A | Discharge status: Home / Self Care | Payer: Federal, State, Local not specified - PPO | Source: Ambulatory Visit | Attending: Surgery | Admitting: Surgery

## 2013-05-09 ENCOUNTER — Encounter (HOSPITAL_COMMUNITY): Payer: Self-pay | Admitting: Pharmacy Technician

## 2013-05-09 DIAGNOSIS — Z6839 Body mass index (BMI) 39.0-39.9, adult: Secondary | ICD-10-CM | POA: Insufficient documentation

## 2013-05-09 DIAGNOSIS — E669 Obesity, unspecified: Secondary | ICD-10-CM | POA: Insufficient documentation

## 2013-05-09 DIAGNOSIS — R1031 Right lower quadrant pain: Secondary | ICD-10-CM | POA: Insufficient documentation

## 2013-05-09 DIAGNOSIS — G8929 Other chronic pain: Secondary | ICD-10-CM | POA: Insufficient documentation

## 2013-05-09 DIAGNOSIS — Z9889 Other specified postprocedural states: Secondary | ICD-10-CM | POA: Diagnosis not present

## 2013-05-09 DIAGNOSIS — IMO0002 Reserved for concepts with insufficient information to code with codable children: Secondary | ICD-10-CM | POA: Diagnosis not present

## 2013-05-09 DIAGNOSIS — K432 Incisional hernia without obstruction or gangrene: Secondary | ICD-10-CM

## 2013-05-09 LAB — CBC
HEMATOCRIT: 36.9 % (ref 36.0–46.0)
HEMOGLOBIN: 12.1 g/dL (ref 12.0–15.0)
MCH: 26.1 pg (ref 26.0–34.0)
MCHC: 32.8 g/dL (ref 30.0–36.0)
MCV: 79.5 fL (ref 78.0–100.0)
Platelets: 318 10*3/uL (ref 150–400)
RBC: 4.64 MIL/uL (ref 3.87–5.11)
RDW: 14.9 % (ref 11.5–15.5)
WBC: 7.7 10*3/uL (ref 4.0–10.5)

## 2013-05-09 LAB — PROTIME-INR
INR: 0.96 (ref 0.00–1.49)
Prothrombin Time: 12.6 seconds (ref 11.6–15.2)

## 2013-05-09 LAB — HCG, SERUM, QUALITATIVE: Preg, Serum: NEGATIVE

## 2013-05-09 LAB — APTT: aPTT: 41 seconds — ABNORMAL HIGH (ref 24–37)

## 2013-05-09 LAB — GLUCOSE, CAPILLARY: Glucose-Capillary: 154 mg/dL — ABNORMAL HIGH (ref 70–99)

## 2013-05-09 MED ORDER — FENTANYL CITRATE 0.05 MG/ML IJ SOLN
INTRAMUSCULAR | Status: DC
Start: 2013-05-09 — End: 2013-05-10
  Filled 2013-05-09: qty 4

## 2013-05-09 MED ORDER — MIDAZOLAM HCL 2 MG/2ML IJ SOLN
INTRAMUSCULAR | Status: AC
  Filled 2013-05-09: qty 4

## 2013-05-09 MED ORDER — LIDOCAINE HCL 1 % IJ SOLN
INTRAMUSCULAR | Status: AC
  Filled 2013-05-09: qty 10

## 2013-05-09 MED ORDER — SODIUM CHLORIDE 0.9 % IV SOLN
Freq: Once | INTRAVENOUS | Status: AC
  Administered 2013-05-09: 10:00:00 via INTRAVENOUS

## 2013-05-09 MED ORDER — FENTANYL CITRATE 0.05 MG/ML IJ SOLN
INTRAMUSCULAR | Status: DC | PRN
  Administered 2013-05-09 (×4): 50 ug via INTRAVENOUS

## 2013-05-09 NOTE — H&P (Signed)
Agree with above.  For CT guided procedure today.

## 2013-05-09 NOTE — Sedation Documentation (Addendum)
Pt allergic to Versed- will medicate with Fentanyl

## 2013-05-09 NOTE — H&P (Signed)
Tina [name] is an 43 y.o. female.   Chief Complaint: Pt has had ventral hernia repair 09/2011 Development of incisional hernia with mesh placement 01/2013 Now with RLQ pain CT 03/2013 reveals RLQ seroma Continued pain--possibly enlarging Now scheduled for abdomen seroma aspiration/possible drain placement  HPI: HTN; DM; CVA 2011; ventral hernia; RLS  Past Medical History  Diagnosis Date  . Spastic hemiplegia affecting dominant side   . Panic disorder without agoraphobia   . Depression   . Anxiety   . Diabetes mellitus   . Hypertension   . Blood transfusion     IN 2012  AFTER C -SECTION  . Cerebral thrombosis with cerebral infarction JUNE 2011    RIGHT SIDED WEAKNESS ( ARM AND LEG ) AND SPASMS  . Stroke   . Right rotator cuff tear     PAIN IN RIGHT SHOULDER  . Ventral hernia     RIGHT LOWER QUADRANT-CAUSING SOME PAIN  . Anemia     DURING MENSES--HAS HEAVY BLEEDING WITH PERODS  . Headache(784.0)     MIGRAINES--NOT REALLY HEADACHE-MORE LIKE PRESSURE SENSATION IN HEAD-FEELS DIZZIY AND  FAINT AS THE PRESSURE RESOLVES  . Diabetic neuropathy     BOTH FEET --COMES AND GOES  . Restless leg syndrome     DIAGNOSED BY SLEEP STUDY - PT TOLD SHE DID NOT HAVE SLEEP APNEA  . Rash     HANDS, ARMS --STATES HX OF RASH EVER SINCE CHILDBIRTH/PREGNANCY.  STATES THE RASH OFTEN OCCURS WHEN SHE IS REALLY STRESSED.  Marland Kitchen Hernia, incisional, RLQ, s/p lap repair Sep 2013 09/02/2011  . SBO (small bowel obstruction) 06/03/2012  . Back pain, chronic   . Leg pain, right     Past Surgical History  Procedure Laterality Date  . Cesarean section  2012  . Uterine fibroid surgery      2 SURGERIES FOR FIBROIDS  . Ureter revision    . Ventral hernia repair  10/03/2011    Procedure: LAPAROSCOPIC VENTRAL HERNIA;  Surgeon: Adin Hector, MD;  Location: [facility];  Service: General;  Laterality: N/A;  . Hernia repair  10/03/11    ventral hernia repair  . Esophagogastroduodenoscopy N/A 06/03/2012    Procedure:  ESOPHAGOGASTRODUODENOSCOPY (EGD);  Surgeon: [name] Craver, MD;  Location: [facility] ENDOSCOPY;  Service: Endoscopy;  Laterality: N/A;  . Diagnostic laparoscopy    . Umbilical hernia repair N/A 02/04/2013    Procedure: LAPAROSCOPIC ventral wall hernia repair LAPAROSCOPIC LYSIS OF ADHESIONS laparoscopic exploration of abdomen ;  Surgeon: Adin Hector, MD;  Location: [facility];  Service: General;  Laterality: N/A;  . Insertion of mesh N/A 02/04/2013    Procedure: INSERTION OF MESH;  Surgeon: Adin Hector, MD;  Location: [facility];  Service: General;  Laterality: N/A;    Family History  Problem Relation Age of Onset  . Diabetes Father   . Kidney disease Father   . Other Neg Hx   . Diabetes Maternal Grandmother   . Hyperlipidemia Paternal Grandmother   . Stroke Paternal Grandmother    Social History:  reports that she has never smoked. She has never used smokeless tobacco. She reports that she does not drink alcohol or use illicit drugs.  Allergies:  Allergies  Allergen Reactions  . Contrast Media [Iodinated Diagnostic Agents] Other (See Comments)    Difficulty breathing  . Iohexol Hives, Nausea And Vomiting and Swelling     Desc: Magnevist-gadolinium-difficulty breathing, throat swelling   . Midazolam Hcl Anaphylaxis    Difficulty breathing  .  Shellfish Allergy Anaphylaxis  . Avandia [Rosiglitazone Maleate]     Patient doesn't remember reaction  . Geodon [Ziprasidone]   . Kiwi Extract Itching and Swelling  . Metformin And Related Nausea And Vomiting and Other (See Comments)    diarrhea  . Other Itching    Patient is allergic to all nuts except peanuts.      (Not in a hospital admission)  Results for orders placed during the hospital encounter of 05/09/13 (from the past 48 hour(s))  GLUCOSE, CAPILLARY     Status: Abnormal   Collection Time    05/09/13  9:08 AM      Result Value Ref Range   Glucose-Capillary 154 (*) 70 - 99 mg/dL  CBC     Status: None   Collection Time    05/09/13   9:31 AM      Result Value Ref Range   WBC 7.7  4.0 - 10.5 K/uL   RBC 4.64  3.87 - 5.11 MIL/uL   Hemoglobin 12.1  12.0 - 15.0 g/dL   HCT 36.9  36.0 - 46.0 %   MCV 79.5  78.0 - 100.0 fL   MCH 26.1  26.0 - 34.0 pg   MCHC 32.8  30.0 - 36.0 g/dL   RDW 14.9  11.5 - 15.5 %   Platelets 318  150 - 400 K/uL   No results found.  Review of Systems  Constitutional: Negative for fever.  Respiratory: Negative for shortness of breath.   Cardiovascular: Negative for chest pain.  Gastrointestinal: Positive for abdominal pain. Negative for nausea and vomiting.  Neurological: Negative for dizziness, weakness and headaches.  Psychiatric/Behavioral: Negative for substance abuse.    Blood pressure 114/67, pulse 68, temperature 98.6 F (37 C), temperature source Oral, resp. rate 20, height 5' 6.5" (1.689 m), weight 107.502 kg (237 lb), last menstrual period 04/08/2013, SpO2 100.00%. Physical Exam  Constitutional: She is oriented to person, place, and time. She appears well-developed and well-nourished.  Cardiovascular: Normal rate, regular rhythm and normal heart sounds.   No murmur heard. Respiratory: Effort normal and breath sounds normal. She has no wheezes.  GI: Soft. Bowel sounds are normal. There is tenderness.  Musculoskeletal: Normal range of motion.  Neurological: She is alert and oriented to person, place, and time.  Skin: Skin is warm and dry.  Psychiatric: She has a normal mood and affect. Her behavior is normal. Judgment and thought content normal.     Assessment/Plan Ventral hernia repair 09/2011 Incisional hernia development--- mesh placed 01/2013 RLQ pain: +seroma Now scheduled for seroma aspiration/poss drain placement Pt aware of procedure benefits and risks and agreeable to proceed Consent signed and in chart  Lavonia Drafts 05/09/2013, 10:09 AM

## 2013-05-09 NOTE — Procedures (Signed)
Procedure:  CT guided drainage of abdominal wall seroma Findings:  110 mL of clear, yellow fluid drained.  No drain left behind.  Sample sent for culture.

## 2013-05-12 LAB — CULTURE, ROUTINE-ABSCESS: CULTURE: NO GROWTH

## 2013-05-14 LAB — ANAEROBIC CULTURE

## 2013-05-18 DIAGNOSIS — E119 Type 2 diabetes mellitus without complications: Secondary | ICD-10-CM | POA: Diagnosis not present

## 2013-05-18 DIAGNOSIS — G589 Mononeuropathy, unspecified: Secondary | ICD-10-CM | POA: Diagnosis not present

## 2013-05-18 DIAGNOSIS — R109 Unspecified abdominal pain: Secondary | ICD-10-CM | POA: Diagnosis not present

## 2013-05-23 ENCOUNTER — Ambulatory Visit (INDEPENDENT_AMBULATORY_CARE_PROVIDER_SITE_OTHER): Payer: Federal, State, Local not specified - PPO | Admitting: Surgery

## 2013-05-23 ENCOUNTER — Encounter (INDEPENDENT_AMBULATORY_CARE_PROVIDER_SITE_OTHER): Payer: Self-pay | Admitting: Surgery

## 2013-05-23 VITALS — BP 130/80 | HR 80 | Temp 97.4°F | Resp 14 | Ht 66.5 in | Wt 244.0 lb

## 2013-05-23 DIAGNOSIS — IMO0002 Reserved for concepts with insufficient information to code with codable children: Secondary | ICD-10-CM

## 2013-05-23 DIAGNOSIS — K432 Incisional hernia without obstruction or gangrene: Secondary | ICD-10-CM

## 2013-05-23 NOTE — Patient Instructions (Signed)
Please consider the recommendations that we have given you today:  If feeling worsening pain or swelling, consider attempted re-drainage with placement of drain this time for the abdominal wall seroma.  Otherwise, activity as tolerated  See the Handout(s) we have given you.  Please call our office at [phone] if you wish to schedule surgery or if you have further questions / concerns.   HERNIA REPAIR: POST OP INSTRUCTIONS  1. DIET: Follow a light bland diet the first 24 hours after arrival home, such as soup, liquids, crackers, etc.  Be sure to include lots of fluids daily.  Avoid fast food or heavy meals as your are more likely to get nauseated.  Eat a low fat the next few days after surgery. 2. Take your usually prescribed home medications unless otherwise directed. 3. PAIN CONTROL: a. Pain is best controlled by a usual combination of three different methods TOGETHER: i. Ice/Heat ii. Over the counter pain medication iii. Prescription pain medication b. Most patients will experience some swelling and bruising around the hernia(s) such as the bellybutton, groins, or old incisions.  Ice packs or heating pads (30-60 minutes up to 6 times a day) will help. Use ice for the first few days to help decrease swelling and bruising, then switch to heat to help relax tight/sore spots and speed recovery.  Some people prefer to use ice alone, heat alone, alternating between ice & heat.  Experiment to what works for you.  Swelling and bruising can take several weeks to resolve.   c. It is helpful to take an over-the-counter pain medication regularly for the first few weeks.  Choose one of the following that works best for you: i. Naproxen (Aleve, etc)  Two 23m tabs twice a day ii. Ibuprofen (Advil, etc) Three 2080mtabs four times a day (every meal & bedtime) iii. Acetaminophen (Tylenol, etc) 325-65016mour times a day (every meal & bedtime) d. A  prescription for pain medication should be given to  you upon discharge.  Take your pain medication as prescribed.  i. If you are having problems/concerns with the prescription medicine (does not control pain, nausea, vomiting, rash, itching, etc), please call us [name] see if we need to switch you to a different pain medicine that will work better for you and/or control your side effect better. ii. If you need a refill on your pain medication, please contact your pharmacy.  They will contact our office to request authorization. Prescriptions will not be filled after 5 pm or on week-ends. 4. Avoid getting constipated.  Between the surgery and the pain medications, it is common to experience some constipation.  Increasing fluid intake and taking a fiber supplement (such as Metamucil, Citrucel, FiberCon, MiraLax, etc) 1-2 times a day regularly will usually help prevent this problem from occurring.  A mild laxative (prune juice, Milk of Magnesia, MiraLax, etc) should be taken according to package directions if there are no bowel movements after 48 hours.   5. Wash / shower every day.  You may shower over the dressings as they are waterproof.   6. Remove your waterproof bandages 5 days after surgery.  You may leave the incision open to air.  You may replace a dressing/Band-Aid to cover the incision for comfort if you wish.  Continue to shower over incision(s) after the dressing is off.    7. ACTIVITIES as tolerated:   a. You may resume regular (light) daily activities beginning the next day-such as daily self-care, walking,  climbing stairs-gradually increasing activities as tolerated.  If you can walk 30 minutes without difficulty, it is safe to try more intense activity such as jogging, treadmill, bicycling, low-impact aerobics, swimming, etc. b. Save the most intensive and strenuous activity for last such as sit-ups, heavy lifting, contact sports, etc  Refrain from any heavy lifting or straining until you are off narcotics for pain control.    c. DO NOT PUSH THROUGH PAIN.  Let pain be your guide: If it hurts to do something, don't do it.  Pain is your body warning you to avoid that activity for another week until the pain goes down. d. You may drive when you are no longer taking prescription pain medication, you can comfortably wear a seatbelt, and you can safely maneuver your car and apply brakes. e. Dennis Bast may have sexual intercourse when it is comfortable.  8. FOLLOW UP in our office a. Please call [facility] at [phone] to set up an appointment to see your surgeon in the office for a follow-up appointment approximately 2-3 weeks after your surgery. b. Make sure that you call for this appointment the day you arrive home to insure a convenient appointment time. 9.  IF YOU HAVE DISABILITY OR FAMILY LEAVE FORMS, BRING THEM TO THE OFFICE FOR PROCESSING.  DO NOT GIVE THEM TO YOUR DOCTOR.  WHEN TO CALL us [phone]: 1. Poor pain control 2. Reactions / problems with new medications (rash/itching, nausea, etc)  3. Fever over 101.5 F (38.5 C) 4. Inability to urinate 5. Nausea and/or vomiting 6. Worsening swelling or bruising 7. Continued bleeding from incision. 8. Increased pain, redness, or drainage from the incision   The clinic staff is available to answer your questions during regular business hours (8:30am-5pm).  Please don't hesitate to call and ask to speak to one of our nurses for clinical concerns.   If you have a medical emergency, go to the nearest emergency room or call 911.  A surgeon from [facility] is always on call at the hospitals in [facility], [place], [place], [place], [place]  [address] ?  [address] [address], [place], [place]   [address] MAIN: [id number] ? TOLL FREE: [phone] ? FAX: [phone] [email or web address]  Exercise to Stay Healthy Exercise helps you become and stay healthy. EXERCISE IDEAS AND TIPS Choose exercises that:  You  enjoy.  Fit into your day. You do not need to exercise really hard to be healthy. You can do exercises at a slow or medium level and stay healthy. You can:  Stretch before and after working out.  Try yoga, Pilates, or tai chi.  Lift weights.  Walk fast, swim, jog, run, climb stairs, bicycle, dance, or rollerskate.  Take aerobic classes. Exercises that burn about 150 calories:  Running 1  miles in 15 minutes.  Playing volleyball for 45 to 60 minutes.  Washing and waxing a car for 45 to 60 minutes.  Playing touch football for 45 minutes.  Walking 1  miles in 35 minutes.  Pushing a stroller 1  miles in 30 minutes.  Playing basketball for 30 minutes.  Raking leaves for 30 minutes.  Bicycling 5 miles in 30 minutes.  Walking 2 miles in 30 minutes.  Dancing for 30 minutes.  Shoveling snow for 15 minutes.  Swimming laps for 20 minutes.  Walking up stairs for 15 minutes.  Bicycling 4 miles in 15 minutes.  Gardening for 30 to 45 minutes.  Jumping  rope for 15 minutes.  Washing windows or floors for 45 to 60 minutes. Document Released: 02/01/2010 Document Revised: 03/24/2011 Document Reviewed: 02/01/2010 [facility] Patient Information 2014 [place], [place].

## 2013-05-23 NOTE — Progress Notes (Signed)
Subjective:     Patient ID: Tina [name], female   DOB: 08/27/1970, 43 y.o.   MRN: [id number]  HPI   Note: This dictation was prepared with Dragon/digital dictation along with [facility] technology. Any transcriptional errors that result from this process are unintentional.       Tina [name]  1970/01/19 [id number]  Patient Care Team: Anda Kraft, MD as PCP - General Sheronette Clint Bolder, MD as Consulting Physician (Obstetrics and Gynecology) Garvin Fila, MD as Consulting Physician (Neurology) [name] Craver, MD as Consulting Physician (Gastroenterology)  Procedure (Date: 02/04/2013):  POST-OPERATIVE DIAGNOSIS: Recurrent ventral wall incisional hernia   PROCEDURE: Procedure(s):  LAPAROSCOPIC LYSIS OF ADHESIONS 68min (1/2 case)  LAPAROSCOPIC ventral wall hernia repair  INSERTION OF MESH   SURGEON: Surgeon(s):  Adin Hector, MD  OR FINDINGS: Poor incorporation of central part of mesh with persistent hernia sac. Seromas. No abscess. Gram stain negative for organisms.  8 x 7 recurrent area of hernia. Outer 2/3 rim of old Physiomesh well incorporated.  Dense adhesions of small bowel to central part of mesh. Some incorporation. No fistula.  Type of repair - Laparoscopic underlay repair  Name of mesh - Bard Ventralight dual sided (polypropylene / Seprafilm)  Size of mesh - Length 33 cm, Width 25 cm  Mesh overlap - 7-10 cm  Placement of mesh - Intraperitoneal underlay repair   Anaerobic culture   Status: Final result Visible to patient: This result is viewable by the patient in [place]. Next appt: 08/15/2013 at 02:45 PM in Physical Medicine and Rehabilitation [name], MD)           3wk ago    Specimen Description ABSCESS ABDOMEN HERNIA [place]     Special Requests PATIENT ON FOLLOWING [place]     Gram Stain RARE WBC NO ORGANISMS SEEN Performed at [facility]    Culture NO ANAEROBES ISOLATED Performed at [facility]    Report Status  02/09/2013 FINAL     Resulting Agency [facility]   This patient returns for surgical re-evaluation.  She underwent aspiration of the fluid collection.  Looked clear.  Cultures negative.  She feels like a lump has come back.  She thinks it is smaller.  She is not nearly having the discomfort she did before.  She is walking more.  Feeling better.  No fevers or chills.  Patient Active Problem List   Diagnosis Date Noted  . Seroma, postoperative 05/04/2013  . Nausea with vomiting ?Gastroparesis? 03/21/2013  . Spastic hemiplegia affecting dominant side 02/16/2013  . Recurrent ventral incisional hernia s/p closure/repair w mesh 02/04/2013 01/26/2013  . Constipation, chronic 01/26/2013  . Abdominal pain, chronic, right lower quadrant 06/03/2012  . History of CVA (cerebrovascular accident) 06/03/2012  . HTN (hypertension) 06/03/2012  . Hx of migraines 10/19/2011  . Diabetes mellitus type II, uncontrolled 10/16/2011  . Obesity (BMI 30-39.9) 09/02/2011  . Rotator cuff tear 08/25/2011  . Sciatica 06/10/2011  . Paresthesias in right hand 06/10/2011  . Lumbago 05/09/2011  . Paresthesias 05/09/2011    Past Medical History  Diagnosis Date  . Spastic hemiplegia affecting dominant side   . Panic disorder without agoraphobia   . Depression   . Anxiety   . Diabetes mellitus   . Hypertension   . Blood transfusion     IN 2012  AFTER C -SECTION  . Cerebral thrombosis with cerebral infarction JUNE 2011    RIGHT SIDED WEAKNESS ( ARM AND LEG ) AND SPASMS  . Stroke   .  Right rotator cuff tear     PAIN IN RIGHT SHOULDER  . Ventral hernia     RIGHT LOWER QUADRANT-CAUSING SOME PAIN  . Anemia     DURING MENSES--HAS HEAVY BLEEDING WITH PERODS  . Headache(784.0)     MIGRAINES--NOT REALLY HEADACHE-MORE LIKE PRESSURE SENSATION IN HEAD-FEELS DIZZIY AND  FAINT AS THE PRESSURE RESOLVES  . Diabetic neuropathy     BOTH FEET --COMES AND GOES  . Restless leg syndrome     DIAGNOSED BY SLEEP STUDY - PT TOLD SHE  DID NOT HAVE SLEEP APNEA  . Rash     HANDS, ARMS --STATES HX OF RASH EVER SINCE CHILDBIRTH/PREGNANCY.  STATES THE RASH OFTEN OCCURS WHEN SHE IS REALLY STRESSED.  Marland Kitchen Hernia, incisional, RLQ, s/p lap repair Sep 2013 09/02/2011  . SBO (small bowel obstruction) 06/03/2012  . Back pain, chronic   . Leg pain, right     Past Surgical History  Procedure Laterality Date  . Cesarean section  2012  . Uterine fibroid surgery      2 SURGERIES FOR FIBROIDS  . Ureter revision    . Ventral hernia repair  10/03/2011    Procedure: LAPAROSCOPIC VENTRAL HERNIA;  Surgeon: Adin Hector, MD;  Location: [facility];  Service: General;  Laterality: N/A;  . Hernia repair  10/03/11    ventral hernia repair  . Esophagogastroduodenoscopy N/A 06/03/2012    Procedure: ESOPHAGOGASTRODUODENOSCOPY (EGD);  Surgeon: [name] Craver, MD;  Location: [facility] ENDOSCOPY;  Service: Endoscopy;  Laterality: N/A;  . Diagnostic laparoscopy    . Umbilical hernia repair N/A 02/04/2013    Procedure: LAPAROSCOPIC ventral wall hernia repair LAPAROSCOPIC LYSIS OF ADHESIONS laparoscopic exploration of abdomen ;  Surgeon: Adin Hector, MD;  Location: [facility];  Service: General;  Laterality: N/A;  . Insertion of mesh N/A 02/04/2013    Procedure: INSERTION OF MESH;  Surgeon: Adin Hector, MD;  Location: [facility];  Service: General;  Laterality: N/A;    History   Social History  . Marital Status: Married    Spouse Name: N/A    Number of Children: N/A  . Years of Education: N/A   Occupational History  . Not on file.   Social History Main Topics  . Smoking status: Never Smoker   . Smokeless tobacco: Never Used  . Alcohol Use: No  . Drug Use: No  . Sexual Activity: Yes    Birth Control/ Protection: None   Other Topics Concern  . Not on file   Social History Narrative  . No narrative on file    Family History  Problem Relation Age of Onset  . Diabetes Father   . Kidney disease Father   . Other Neg Hx   . Diabetes Maternal Grandmother    . Hyperlipidemia Paternal Grandmother   . Stroke Paternal Grandmother     Current Outpatient Prescriptions  Medication Sig Dispense Refill  . Amlodipine-Valsartan-HCTZ 10-320-25 MG TABS Take 1 tablet by mouth every evening.       Marland Kitchen APIDRA 100 UNIT/ML injection Inject into the skin 3 (three) times daily with meals.       Marland Kitchen aspirin 325 MG tablet Take 325 mg by mouth daily.      . B-D ULTRAFINE III SHORT PEN 31G X 8 MM MISC       . Biotin 5000 MCG TABS Take 1 tablet by mouth daily.      Marland Kitchen BYSTOLIC 10 MG tablet Take 10 mg by mouth every morning.       Marland Kitchen  furosemide (LASIX) 40 MG tablet Take 20-40 mg by mouth daily as needed for fluid or edema.       . insulin glargine (LANTUS) 100 UNIT/ML injection Inject 22 Units into the skin 2 (two) times daily.       . insulin lispro (HUMALOG) 100 UNIT/ML injection Inject 6-10 Units into the skin 3 (three) times daily before meals.       . INVOKANA 300 MG TABS Take 300 mg by mouth every morning.       . pregabalin (LYRICA) 25 MG capsule Take 25 mg by mouth 2 (two) times daily.      . Prenatal Vit-Fe Fumarate-FA (PRENATAL MULTIVITAMIN) TABS tablet Take 1 tablet by mouth daily at 12 noon.      . promethazine (PHENERGAN) 12.5 MG tablet Take 12.5 mg by mouth every 8 (eight) hours as needed for nausea.      Marland Kitchen tiZANidine (ZANAFLEX) 4 MG tablet Take 2 mg by mouth at bedtime.      . traMADol (ULTRAM) 50 MG tablet Take 50 mg by mouth daily.        No current facility-administered medications for this visit.     Allergies  Allergen Reactions  . Contrast Media [Iodinated Diagnostic Agents] Other (See Comments)    Difficulty breathing  . Iohexol Hives, Nausea And Vomiting and Swelling     Desc: Magnevist-gadolinium-difficulty breathing, throat swelling   . Midazolam Hcl Anaphylaxis    Difficulty breathing  . Shellfish Allergy Anaphylaxis  . Avandia [Rosiglitazone Maleate]     Patient doesn't remember reaction  . Geodon [Ziprasidone]   . Kiwi Extract  Itching and Swelling  . Metformin And Related Nausea And Vomiting and Other (See Comments)    diarrhea  . Other Itching    Patient is allergic to all nuts except peanuts.     BP 130/80  Pulse 80  Temp(Src) 97.4 F (36.3 C) (Temporal)  Resp 14  Ht 5' 6.5" (1.689 m)  Wt 244 lb (110.678 kg)  BMI 38.80 kg/m2  LMP 04/08/2013  No results found.   Review of Systems  Constitutional: Negative for fever, chills and diaphoresis.  HENT: Negative for ear pain, sore throat and trouble swallowing.   Eyes: Negative for photophobia and visual disturbance.  Respiratory: Negative for cough and choking.   Cardiovascular: Negative for chest pain and palpitations.  Gastrointestinal: Negative for nausea, vomiting, abdominal pain, diarrhea, constipation, anal bleeding and rectal pain.  Genitourinary: Negative for dysuria, frequency and difficulty urinating.  Musculoskeletal: Negative for gait problem and myalgias.  Skin: Negative for color change, pallor and rash.  Neurological: Negative for dizziness, speech difficulty, weakness and numbness.  Hematological: Negative for adenopathy.  Psychiatric/Behavioral: Negative for confusion and agitation. The patient is not nervous/anxious.        Objective:   Physical Exam  Constitutional: She is oriented to person, place, and time. She appears well-developed and well-nourished. No distress.  HENT:  Head: Normocephalic.  Mouth/Throat: Oropharynx is clear and moist. No oropharyngeal exudate.  Eyes: Conjunctivae and EOM are normal. Pupils are equal, round, and reactive to light. No scleral icterus.  Neck: Normal range of motion. No tracheal deviation present.  Cardiovascular: Normal rate and intact distal pulses.   Pulmonary/Chest: Effort normal. No respiratory distress. She exhibits no tenderness.  Abdominal: Soft. She exhibits no distension. There is no tenderness. There is no rigidity, no rebound, no guarding, no tenderness at McBurney's point and  negative Murphy's sign. No hernia. Hernia confirmed negative in the  ventral area, confirmed negative in the right inguinal area and confirmed negative in the left inguinal area.    Incisions clean with normal healing ridges.  No hernias  Genitourinary: No vaginal discharge found.  Musculoskeletal: Normal range of motion. She exhibits no tenderness.  Lymphadenopathy:       Right: No inguinal adenopathy present.       Left: No inguinal adenopathy present.  Neurological: She is alert and oriented to person, place, and time. No cranial nerve deficit. She exhibits normal muscle tone. Coordination normal.  Skin: Skin is warm and dry. No rash noted. She is not diaphoretic.  Psychiatric: She has a normal mood and affect. Her behavior is normal.       Assessment:     Marked improvement of symptoms although with recurrence abdominal wall seroma (now smaller)    Plan:     I did offer the patient several options.  The most aggressive option is to repeat aspiration of the seroma with drain placement.  That gives the best chance of resolution.  Another option is observation only and repeat treatment only if it becomes more symptomatic.   Because she is feeling better, she wishes to hold off on doing anything and reevaluate in 3 months.  Low impact exercise such as walking an hour a day at least ideal.  Do not push through pain.    DM control  Diet as tolerated.  Low fat high fiber diet ideal.  Bowel regimen with 30 g fiber a day and fiber supplement as needed to avoid problems.  Cut back on the Metamucil for the next week.   Come back and see me in 3 months to make sure that she is stable or improving, sooner as needed.   Instructions discussed.  Followup with primary care physician for other health issues as would normally be done.  Consider screening for malignancies (breast, prostate, colon, melanoma, etc) as appropriate.  Questions answered.  The patient expressed understanding and  appreciation

## 2013-06-01 DIAGNOSIS — G589 Mononeuropathy, unspecified: Secondary | ICD-10-CM | POA: Diagnosis not present

## 2013-06-01 DIAGNOSIS — E119 Type 2 diabetes mellitus without complications: Secondary | ICD-10-CM | POA: Diagnosis not present

## 2013-06-01 DIAGNOSIS — J029 Acute pharyngitis, unspecified: Secondary | ICD-10-CM | POA: Diagnosis not present

## 2013-06-05 ENCOUNTER — Encounter (HOSPITAL_COMMUNITY): Payer: Self-pay | Admitting: Emergency Medicine

## 2013-06-05 ENCOUNTER — Emergency Department (INDEPENDENT_AMBULATORY_CARE_PROVIDER_SITE_OTHER)
Admission: EM | Admit: 2013-06-05 | Discharge: 2013-06-05 | Disposition: A | Discharge status: Home / Self Care | Payer: Federal, State, Local not specified - PPO | Source: Home / Self Care | Attending: Emergency Medicine | Admitting: Emergency Medicine

## 2013-06-05 DIAGNOSIS — B341 Enterovirus infection, unspecified: Secondary | ICD-10-CM | POA: Diagnosis not present

## 2013-06-05 NOTE — ED Notes (Signed)
2 weeks ago, started an illness with fever and hoarseness, tender areas in mouth.  Today realized areas in mouth were blisters and with noticing sons blisters has come to ucc for evaluation.

## 2013-06-05 NOTE — ED Provider Notes (Signed)
Medical screening examination/treatment/procedure(s) were performed by non-physician practitioner and as supervising physician I was immediately available for consultation/collaboration.  Philipp Deputy, M.D.  Harden Mo, MD 06/05/13 2239

## 2013-06-05 NOTE — ED Provider Notes (Signed)
CSN: [id number]     Arrival date & time 06/05/13  1127 History   First MD Initiated Contact with Patient 06/05/13 1314     Chief Complaint  Patient presents with  . Blister   (Consider location/radiation/quality/duration/timing/severity/associated sxs/prior Treatment) HPI Comments: Patient states he child recently developed Hand-Foot-Mouth disease and over the last 24 hours she had also developed a few small painful vesicular lesions on the inside of her lower lip.  Reports she had a fever and some throat discomfort several days ago.  Is not currently pregnant.   The history is provided by the patient.    Past Medical History  Diagnosis Date  . Spastic hemiplegia affecting dominant side   . Panic disorder without agoraphobia   . Depression   . Anxiety   . Diabetes mellitus   . Hypertension   . Blood transfusion     IN 2012  AFTER C -SECTION  . Cerebral thrombosis with cerebral infarction JUNE 2011    RIGHT SIDED WEAKNESS ( ARM AND LEG ) AND SPASMS  . Stroke   . Right rotator cuff tear     PAIN IN RIGHT SHOULDER  . Ventral hernia     RIGHT LOWER QUADRANT-CAUSING SOME PAIN  . Anemia     DURING MENSES--HAS HEAVY BLEEDING WITH PERODS  . Headache(784.0)     MIGRAINES--NOT REALLY HEADACHE-MORE LIKE PRESSURE SENSATION IN HEAD-FEELS DIZZIY AND  FAINT AS THE PRESSURE RESOLVES  . Diabetic neuropathy     BOTH FEET --COMES AND GOES  . Restless leg syndrome     DIAGNOSED BY SLEEP STUDY - PT TOLD SHE DID NOT HAVE SLEEP APNEA  . Rash     HANDS, ARMS --STATES HX OF RASH EVER SINCE CHILDBIRTH/PREGNANCY.  STATES THE RASH OFTEN OCCURS WHEN SHE IS REALLY STRESSED.  Marland Kitchen Hernia, incisional, RLQ, s/p lap repair Sep 2013 09/02/2011  . SBO (small bowel obstruction) 06/03/2012  . Back pain, chronic   . Leg pain, right    Past Surgical History  Procedure Laterality Date  . Cesarean section  2012  . Uterine fibroid surgery      2 SURGERIES FOR FIBROIDS  . Ureter revision    . Ventral hernia  repair  10/03/2011    Procedure: LAPAROSCOPIC VENTRAL HERNIA;  Surgeon: Adin Hector, MD;  Location: [facility];  Service: General;  Laterality: N/A;  . Hernia repair  10/03/11    ventral hernia repair  . Esophagogastroduodenoscopy N/A 06/03/2012    Procedure: ESOPHAGOGASTRODUODENOSCOPY (EGD);  Surgeon: [name] Craver, MD;  Location: [facility];  Service: Endoscopy;  Laterality: N/A;  . Diagnostic laparoscopy    . Umbilical hernia repair N/A 02/04/2013    Procedure: LAPAROSCOPIC ventral wall hernia repair LAPAROSCOPIC LYSIS OF ADHESIONS laparoscopic exploration of abdomen ;  Surgeon: Adin Hector, MD;  Location: [facility];  Service: General;  Laterality: N/A;  . Insertion of mesh N/A 02/04/2013    Procedure: INSERTION OF MESH;  Surgeon: Adin Hector, MD;  Location: [facility];  Service: General;  Laterality: N/A;   Family History  Problem Relation Age of Onset  . Diabetes Father   . Kidney disease Father   . Other Neg Hx   . Diabetes Maternal Grandmother   . Hyperlipidemia Paternal Grandmother   . Stroke Paternal Grandmother    History  Substance Use Topics  . Smoking status: Never Smoker   . Smokeless tobacco: Never Used  . Alcohol Use: No   OB History   Grav Para Term Preterm  Abortions TAB SAB Ect Mult Living   1 1 0 1 0 0 0 0 0 1      Review of Systems  All other systems reviewed and are negative.   Allergies  Contrast media; Iohexol; Midazolam hcl; Shellfish allergy; Avandia; Geodon; Kiwi extract; Metformin and related; and Other  Home Medications   Prior to Admission medications   Medication Sig Start Date End Date Taking? Authorizing Provider  Amlodipine-Valsartan-HCTZ 10-320-25 MG TABS Take 1 tablet by mouth every evening.  01/09/13   Historical Provider, MD  APIDRA 100 UNIT/ML injection Inject into the skin 3 (three) times daily with meals.  03/04/13   Historical Provider, MD  aspirin 325 MG tablet Take 325 mg by mouth daily.    Historical Provider, MD  B-D ULTRAFINE III  SHORT PEN 31G X 8 MM MISC  12/31/12   Historical Provider, MD  Biotin 5000 MCG TABS Take 1 tablet by mouth daily.    Historical Provider, MD  BYSTOLIC 10 MG tablet Take 10 mg by mouth every morning.  01/05/12   Historical Provider, MD  furosemide (LASIX) 40 MG tablet Take 20-40 mg by mouth daily as needed for fluid or edema.     Historical Provider, MD  insulin glargine (LANTUS) 100 UNIT/ML injection Inject 22 Units into the skin 2 (two) times daily.     Historical Provider, MD  insulin lispro (HUMALOG) 100 UNIT/ML injection Inject 6-10 Units into the skin 3 (three) times daily before meals.     Historical Provider, MD  INVOKANA 300 MG TABS Take 300 mg by mouth every morning.  02/29/12   Historical Provider, MD  pregabalin (LYRICA) 25 MG capsule Take 25 mg by mouth 2 (two) times daily.    Historical Provider, MD  Prenatal Vit-Fe Fumarate-FA (PRENATAL MULTIVITAMIN) TABS tablet Take 1 tablet by mouth daily at 12 noon.    Historical Provider, MD  promethazine (PHENERGAN) 12.5 MG tablet Take 12.5 mg by mouth every 8 (eight) hours as needed for nausea. 03/21/13   Adin Hector, MD  tiZANidine (ZANAFLEX) 4 MG tablet Take 2 mg by mouth at bedtime. 02/16/13   [name], MD  traMADol (ULTRAM) 50 MG tablet Take 50 mg by mouth daily.     Historical Provider, MD   BP 115/74  Pulse 75  Temp(Src) 98.5 F (36.9 C) (Oral)  Resp 18  SpO2 100%  LMP 04/08/2013 Physical Exam  Nursing note and vitals reviewed. Constitutional: She is oriented to person, place, and time. She appears well-developed and well-nourished. No distress.  HENT:  Head: Normocephalic and atraumatic.  Right Ear: Hearing and external ear normal.  Left Ear: Hearing and external ear normal.  Nose: Nose normal.  Mouth/Throat: Uvula is midline and mucous membranes are normal. Oral lesions present. No trismus in the jaw. No uvula swelling.  Few small scattered yellow vesicular lesions on inner surface of lower lip  Eyes: Conjunctivae  are normal. No scleral icterus.  Cardiovascular: Normal rate, regular rhythm and normal heart sounds.   Pulmonary/Chest: Effort normal and breath sounds normal.  Abdominal: Soft. Bowel sounds are normal. She exhibits no distension. There is no tenderness.  Musculoskeletal: Normal range of motion.  Neurological: She is alert and oriented to person, place, and time.  Skin: Skin is warm and dry. No rash noted. No erythema.  Psychiatric: She has a normal mood and affect. Her behavior is normal.    ED Course  Procedures (including critical care time) Labs Review Labs Reviewed - No  data to display  Imaging Review No results found.   MDM   1. Coxsackie virus infection   Advised regarding symptomatic care at home and to observe her other children in household for signs of illness.    [place], [place] 06/05/13 1357

## 2013-06-05 NOTE — Discharge Instructions (Signed)
Viral Exanthems, Adult  Many viral infections of the skin are called viral exanthems. Exanthem is another name for a rash or skin eruption. The most common viral exanthems include the following:   German measles or rubella.   Measles or rubeola.   Roseola.   Parvovirus B19 (Erythema infectiosum or Fifth disease).   Chickenpox or varicella.  DIAGNOSIS   Sometimes, other problems may cause a rash that looks like a viral exanthem. Most often, your caregiver can determine whether you have a viral exanthem by looking at the rash. They usually have distinct patterns or appearance. Lab work may be done if the diagnosis is uncertain. Sometimes, a small tissue sample (biopsy) of the rash may need to be taken.  TREATMENT   Immunizations have led to a decrease in the number of cases of measles, mumps, and rubella. Viral exanthems may require clinical treatment if a bacterial infection or other problems follow. The rash may be associated with:   Minor sore throat.   Aches and pains.   Runny nose.   Watery eyes.   Tiredness.   Some coughs.   Gastrointestinal infections causing nausea, vomiting, and diarrhea.  Viral exanthems do not respond to antibiotic medicines, because they are not caused by bacteria.  HOME CARE INSTRUCTIONS    Only take over-the-counter or prescription medicines for pain, discomfort, diarrhea, or fever as directed by your caregiver.   Drink enough water and fluids to keep your urine clear or pale yellow.  SEEK MEDICAL CARE IF:   You develop swollen neck glands. This may feel like lumps or bumps in the neck.   You develop tenderness over your sinuses.   You are not feeling partly better after 3 days.   You develop muscle aches.   You are feeling more tired than you would expect.   You get a persistent cough with mucus.  SEEK IMMEDIATE MEDICAL CARE IF:    You have a fever.   You develop red eyes or eye pain.   You develop sores in your mouth and difficulty drinking or eating.   You  develop a sore throat with pus and difficulty swallowing.   You develop neck pain or a stiff neck.   You develop a severe headache.   You develop vomiting that will not stop.  Document Released: 03/22/2002 Document Revised: 03/24/2011 Document Reviewed: 03/19/2010  [employer] Patient Information 2014 [employer].

## 2013-06-05 NOTE — ED Notes (Signed)
Mother and child are being seen in the same treatment room

## 2013-06-26 ENCOUNTER — Emergency Department (INDEPENDENT_AMBULATORY_CARE_PROVIDER_SITE_OTHER): Payer: Federal, State, Local not specified - PPO

## 2013-06-26 ENCOUNTER — Emergency Department (INDEPENDENT_AMBULATORY_CARE_PROVIDER_SITE_OTHER)
Admission: EM | Admit: 2013-06-26 | Discharge: 2013-06-26 | Disposition: A | Discharge status: Home / Self Care | Payer: Federal, State, Local not specified - PPO | Source: Home / Self Care | Attending: Emergency Medicine | Admitting: Emergency Medicine

## 2013-06-26 ENCOUNTER — Encounter (HOSPITAL_COMMUNITY): Payer: Self-pay | Admitting: Emergency Medicine

## 2013-06-26 DIAGNOSIS — Y9301 Activity, walking, marching and hiking: Secondary | ICD-10-CM | POA: Diagnosis not present

## 2013-06-26 DIAGNOSIS — X500XXA Overexertion from strenuous movement or load, initial encounter: Secondary | ICD-10-CM | POA: Diagnosis not present

## 2013-06-26 DIAGNOSIS — S82831A Other fracture of upper and lower end of right fibula, initial encounter for closed fracture: Secondary | ICD-10-CM

## 2013-06-26 DIAGNOSIS — S82899A Other fracture of unspecified lower leg, initial encounter for closed fracture: Secondary | ICD-10-CM | POA: Diagnosis not present

## 2013-06-26 MED ORDER — IBUPROFEN 800 MG PO TABS: 800.0000 mg | ORAL_TABLET | Freq: Three times a day (TID) | ORAL | Status: DC | PRN

## 2013-06-26 NOTE — ED Provider Notes (Signed)
CSN: [id number]     Arrival date & time 06/26/13  0909 History   First MD Initiated Contact with Patient 06/26/13 0920     Chief Complaint  Patient presents with  . Ankle Pain   (Consider location/radiation/quality/duration/timing/severity/associated sxs/prior Treatment) HPI Comments: Reports she twisted/injured her right ankle while powerwalking on 06-24-2013. Area has remained painful and swollen since injury  The history is provided by the patient.    Past Medical History  Diagnosis Date  . Spastic hemiplegia affecting dominant side   . Panic disorder without agoraphobia   . Depression   . Anxiety   . Diabetes mellitus   . Hypertension   . Blood transfusion     IN 2012  AFTER C -SECTION  . Cerebral thrombosis with cerebral infarction JUNE 2011    RIGHT SIDED WEAKNESS ( ARM AND LEG ) AND SPASMS  . Stroke   . Right rotator cuff tear     PAIN IN RIGHT SHOULDER  . Ventral hernia     RIGHT LOWER QUADRANT-CAUSING SOME PAIN  . Anemia     DURING MENSES--HAS HEAVY BLEEDING WITH PERODS  . Headache(784.0)     MIGRAINES--NOT REALLY HEADACHE-MORE LIKE PRESSURE SENSATION IN HEAD-FEELS DIZZIY AND  FAINT AS THE PRESSURE RESOLVES  . Diabetic neuropathy     BOTH FEET --COMES AND GOES  . Restless leg syndrome     DIAGNOSED BY SLEEP STUDY - PT TOLD SHE DID NOT HAVE SLEEP APNEA  . Rash     HANDS, ARMS --STATES HX OF RASH EVER SINCE CHILDBIRTH/PREGNANCY.  STATES THE RASH OFTEN OCCURS WHEN SHE IS REALLY STRESSED.  Marland Kitchen Hernia, incisional, RLQ, s/p lap repair Sep 2013 09/02/2011  . SBO (small bowel obstruction) 06/03/2012  . Back pain, chronic   . Leg pain, right    Past Surgical History  Procedure Laterality Date  . Cesarean section  2012  . Uterine fibroid surgery      2 SURGERIES FOR FIBROIDS  . Ureter revision    . Ventral hernia repair  10/03/2011    Procedure: LAPAROSCOPIC VENTRAL HERNIA;  Surgeon: Adin Hector, MD;  Location: [facility];  Service: General;  Laterality: N/A;  . Hernia  repair  10/03/11    ventral hernia repair  . Esophagogastroduodenoscopy N/A 06/03/2012    Procedure: ESOPHAGOGASTRODUODENOSCOPY (EGD);  Surgeon: [name] Craver, MD;  Location: [facility] ENDOSCOPY;  Service: Endoscopy;  Laterality: N/A;  . Diagnostic laparoscopy    . Umbilical hernia repair N/A 02/04/2013    Procedure: LAPAROSCOPIC ventral wall hernia repair LAPAROSCOPIC LYSIS OF ADHESIONS laparoscopic exploration of abdomen ;  Surgeon: Adin Hector, MD;  Location: [facility];  Service: General;  Laterality: N/A;  . Insertion of mesh N/A 02/04/2013    Procedure: INSERTION OF MESH;  Surgeon: Adin Hector, MD;  Location: [facility];  Service: General;  Laterality: N/A;   Family History  Problem Relation Age of Onset  . Diabetes Father   . Kidney disease Father   . Other Neg Hx   . Diabetes Maternal Grandmother   . Hyperlipidemia Paternal Grandmother   . Stroke Paternal Grandmother    History  Substance Use Topics  . Smoking status: Never Smoker   . Smokeless tobacco: Never Used  . Alcohol Use: No   OB History   Grav Para Term Preterm Abortions TAB SAB Ect Mult Living   1 1 0 1 0 0 0 0 0 1      Review of Systems  All other systems reviewed and  are negative.   Allergies  Contrast media; Iohexol; Midazolam hcl; Shellfish allergy; Avandia; Geodon; Kiwi extract; Metformin and related; and Other  Home Medications   Prior to Admission medications   Medication Sig Start Date End Date Taking? Authorizing Provider  Amlodipine-Valsartan-HCTZ 10-320-25 MG TABS Take 1 tablet by mouth every evening.  01/09/13   Historical Provider, MD  APIDRA 100 UNIT/ML injection Inject into the skin 3 (three) times daily with meals.  03/04/13   Historical Provider, MD  aspirin 325 MG tablet Take 325 mg by mouth daily.    Historical Provider, MD  B-D ULTRAFINE III SHORT PEN 31G X 8 MM MISC  12/31/12   Historical Provider, MD  Biotin 5000 MCG TABS Take 1 tablet by mouth daily.    Historical Provider, MD  BYSTOLIC 10 MG  tablet Take 10 mg by mouth every morning.  01/05/12   Historical Provider, MD  furosemide (LASIX) 40 MG tablet Take 20-40 mg by mouth daily as needed for fluid or edema.     Historical Provider, MD  ibuprofen (ADVIL,MOTRIN) 800 MG tablet Take 1 tablet (800 mg total) by mouth every 8 (eight) hours as needed for mild pain or moderate pain. 06/26/13   Lahoma Rocker, PA  insulin glargine (LANTUS) 100 UNIT/ML injection Inject 22 Units into the skin 2 (two) times daily.     Historical Provider, MD  insulin lispro (HUMALOG) 100 UNIT/ML injection Inject 6-10 Units into the skin 3 (three) times daily before meals.     Historical Provider, MD  INVOKANA 300 MG TABS Take 300 mg by mouth every morning.  02/29/12   Historical Provider, MD  pregabalin (LYRICA) 25 MG capsule Take 25 mg by mouth 2 (two) times daily.    Historical Provider, MD  Prenatal Vit-Fe Fumarate-FA (PRENATAL MULTIVITAMIN) TABS tablet Take 1 tablet by mouth daily at 12 noon.    Historical Provider, MD  promethazine (PHENERGAN) 12.5 MG tablet Take 12.5 mg by mouth every 8 (eight) hours as needed for nausea. 03/21/13   Adin Hector, MD  tiZANidine (ZANAFLEX) 4 MG tablet Take 2 mg by mouth at bedtime. 02/16/13   [name], MD  traMADol (ULTRAM) 50 MG tablet Take 50 mg by mouth daily.     Historical Provider, MD   BP 130/78  Pulse 78  Temp(Src) 99 F (37.2 C) (Oral)  Resp 16  SpO2 100%  LMP 06/11/2013 Physical Exam  Nursing note and vitals reviewed. Constitutional: She is oriented to person, place, and time. She appears well-developed and well-nourished. No distress.  HENT:  Head: Normocephalic and atraumatic.  Eyes: Conjunctivae are normal. No scleral icterus.  Cardiovascular: Normal rate.   Pulmonary/Chest: Effort normal.  Musculoskeletal: Normal range of motion.       Right ankle: She exhibits swelling. She exhibits normal range of motion, no ecchymosis, no deformity, no laceration and normal pulse. Tenderness. Lateral  malleolus tenderness found. No medial malleolus, no head of 5th metatarsal and no proximal fibula tenderness found. Achilles tendon normal.  Neurological: She is alert and oriented to person, place, and time.  Skin: Skin is warm, dry and intact.  Psychiatric: She has a normal mood and affect. Her behavior is normal.    ED Course  Procedures (including critical care time) Labs Review Labs Reviewed - No data to display  Imaging Review Dg Ankle Complete Right  06/26/2013   CLINICAL DATA:  Right ankle pain post injury 2 days ago  EXAM: RIGHT ANKLE - COMPLETE 3+ VIEW  COMPARISON:  None.  FINDINGS: Three views of the right ankle submitted. No displaced fracture or subluxation. There is mild soft tissue swelling adjacent to lateral and medial malleolus. There is a vague lucent line at the tip of distal fibula suspicious for nondisplaced fracture. Clinical correlation is necessary. Ankle mortise is preserved. Plantar spurring of calcaneus.  IMPRESSION: No displaced fracture or subluxation. There is mild soft tissue swelling adjacent to lateral and medial malleolus. There is a vague lucent line at the tip of distal fibula suspicious for nondisplaced fracture. Clinical correlation is necessary.   Electronically Signed   By: Lahoma Crocker M.D.   On: 06/26/2013 10:44     MDM   1. Closed fracture of right distal fibula    Films suggest hairline ND fracture of distal fibula Will place patient in CAM walker and advise ortho follow up in one week (Dr. Alain Marion). Ice, elevation and ibuprofen to reduce pain and swelling.   [place], [place] 06/26/13 1118

## 2013-06-26 NOTE — ED Notes (Signed)
Pt made aware of delays and given warm blanket

## 2013-06-26 NOTE — ED Notes (Signed)
Pt  Reports  She  inj  Her  r  Ankle     While  Exercising  sev  Days    Ago  -  Pain /  Swelling  Noted          denys  Any other  injurys   She  Ambulated  To  Room with a  Slow  Steady gait

## 2013-06-26 NOTE — ED Provider Notes (Signed)
Medical screening examination/treatment/procedure(s) were performed by non-physician practitioner and as supervising physician I was immediately available for consultation/collaboration.  Philipp Deputy, M.D.  [place]arden Mo, MD 06/26/13 [phone]

## 2013-06-26 NOTE — Discharge Instructions (Signed)
You may have a small, hairline fracture of your distal fibula (a bone of your lower leg). Please wear boot splint during the day when walking and remove at night. Ice and elevate to reduce swelling. Ibuprofen as directed for pain. Contact the orthopedist listed on your discharge paperwork or the orthopedist of your choice for follow up evaluation in one week.  Fibular Fracture, Ankle, Adult, Undisplaced, Treated With Immobilization A simple fracture of the bone below the knee on the outside of your leg (fibula) usually heals without problems. CAUSES Typically, a fibular fracture occurs as a result of trauma. A blow to the side of your leg or a powerful twisting movement can cause a fracture. Fibular fractures are often seen as a result of football, soccer, or skiing injuries. SYMPTOMS Symptoms of a fibular fracture can include:  Pain.  Shortening or abnormal alignment of your lower leg (angulation). DIAGNOSIS A health care provider will need to examine the leg. X-ray exams will be ordered for further to confirm the fracture and evaluate the extent and of the injury. TREATMENT  Typically, a cast or immobilizer is applied. Sometimes a splint is placed on these fractures if it is needed for comfort or if the bones are badly out of place. Crutches may be needed to help you get around.  HOME CARE INSTRUCTIONS   Apply ice to the injured area:  Put ice in a plastic bag.  Place a towel between your skin and the bag.  Leave the ice on for 20 minutes, 2 3 times a day.  Use crutches as directed. Resume walking without crutches as directed by your health care provider or when comfortable doing so.  Only take over-the-counter or prescription medicines for pain, discomfort, or fever as directed by your health care provider.  Keeping your leg raised may lessen swelling.  If you have a removable splint or boot, do not remove the boot unless directed by your health care provider.  Do not not drive a  car or operate a motor vehicle until your health care provider specifically tells you it is safe to do so. SEEK IMMEDIATE MEDICAL CARE IF:   Your cast gets damaged or breaks.  You have continued severe pain or more swelling than you did before the cast was put on, or the pain is not controlled with medications.  Your skin or nails below the injury turn blue or grey, or feel cold or numb.  There is a bad smell or pus coming from under the cast.  You develop severe pain in ankle or foot. MAKE SURE YOU:   Understand these instructions.  Will watch your condition.  Will get help right away if you are not doing well or get worse. Document Released: 09/21/2001 Document Revised: 10/20/2012 Document Reviewed: 08/11/2012 [facility] Patient Information 2014 [employer].

## 2013-07-01 DIAGNOSIS — S8263XA Displaced fracture of lateral malleolus of unspecified fibula, initial encounter for closed fracture: Secondary | ICD-10-CM | POA: Diagnosis not present

## 2013-07-28 ENCOUNTER — Other Ambulatory Visit: Payer: Self-pay | Admitting: Physical Medicine & Rehabilitation

## 2013-07-29 DIAGNOSIS — S8290XD Unspecified fracture of unspecified lower leg, subsequent encounter for closed fracture with routine healing: Secondary | ICD-10-CM | POA: Diagnosis not present

## 2013-08-15 ENCOUNTER — Ambulatory Visit: Payer: Medicare Other | Admitting: Physical Medicine & Rehabilitation

## 2013-08-18 ENCOUNTER — Encounter: Payer: Self-pay | Admitting: Nurse Practitioner

## 2013-08-24 ENCOUNTER — Ambulatory Visit: Payer: Self-pay | Admitting: Nurse Practitioner

## 2013-08-30 ENCOUNTER — Other Ambulatory Visit: Payer: Self-pay

## 2013-09-01 ENCOUNTER — Ambulatory Visit (INDEPENDENT_AMBULATORY_CARE_PROVIDER_SITE_OTHER): Payer: Federal, State, Local not specified - PPO | Admitting: Neurology

## 2013-09-01 ENCOUNTER — Encounter: Payer: Self-pay | Admitting: Neurology

## 2013-09-01 VITALS — BP 122/65 | HR 71 | Ht 67.0 in | Wt 243.2 lb

## 2013-09-01 DIAGNOSIS — I10 Essential (primary) hypertension: Secondary | ICD-10-CM | POA: Diagnosis not present

## 2013-09-01 DIAGNOSIS — G589 Mononeuropathy, unspecified: Secondary | ICD-10-CM | POA: Diagnosis not present

## 2013-09-01 DIAGNOSIS — G44209 Tension-type headache, unspecified, not intractable: Secondary | ICD-10-CM | POA: Diagnosis not present

## 2013-09-01 DIAGNOSIS — E119 Type 2 diabetes mellitus without complications: Secondary | ICD-10-CM | POA: Diagnosis not present

## 2013-09-01 NOTE — Progress Notes (Signed)
[facility] [address] [place]. [place] [address] [phone]       OFFICE FOLLOW-UP NOTE  [name] Date of Birth:  1970-05-31 Medical Record Number:  [id number]   HPI: 61 year African American lady with remote history of left pontine infarct in June 2011 due to small vessel disease with multiple vascular risk for hypertension, hyperlipidemia, diabetes and obesity.  She is seen today for followup her last visit in our office 2 years ago. She is concerned today about new headaches that she's had for the last 8 months or so following hernia surgery. She describes this involving the whole head sensation of the head feeling blown up with a burst like sensation with the pressure released feeling. After the headache is gone she feels lightheaded and dizzy and nearly passes out. This last only a few minutes. This was initially infrequent but for the last several weeks this has been happening on a daily basis. There is no right complain nausea vomiting sound sensitivity. She does not take any medications for this. She does however take tramadol for chronic right shoulder pain and Zanaflex for muscle spasms in her feet. She does admit to increased stress in her life recently. She has remote history of left pontine infarct and 2011 and mild right-sided weakness from that for which she has recovered very well. She is still taking aspirin regularly and talking and well. She states her blood pressure control is good and today it is 122/65. Her diabetes control is not adequate and last HbA1c was 8.3. Lipid profile checked a month ago was fine as per her primary physician dr Wilson Singer.  ROS:   14 system review of systems is positive for weight gain, fatigue, blurred and double vision, shortness of breath, leg swelling, bringing the years, spinning sensation, rash, itching, runny nose, skin sensitivity, muscle cramps and aching, feeling cold and thirst, confusion, memory loss, numbness,  weakness, anxiety, depression, not enough sleep, decreased energy, and his interest in activities, insomnia and sleepiness.  PMH:  Past Medical History  Diagnosis Date  . Spastic hemiplegia affecting dominant side   . Panic disorder without agoraphobia   . Depression   . Anxiety   . Diabetes mellitus   . Hypertension   . Blood transfusion     IN 2012  AFTER C -SECTION  . Cerebral thrombosis with cerebral infarction JUNE 2011    RIGHT SIDED WEAKNESS ( ARM AND LEG ) AND SPASMS  . Stroke   . Right rotator cuff tear     PAIN IN RIGHT SHOULDER  . Ventral hernia     RIGHT LOWER QUADRANT-CAUSING SOME PAIN  . Anemia     DURING MENSES--HAS HEAVY BLEEDING WITH PERODS  . Headache(784.0)     MIGRAINES--NOT REALLY HEADACHE-MORE LIKE PRESSURE SENSATION IN HEAD-FEELS DIZZIY AND  FAINT AS THE PRESSURE RESOLVES  . Diabetic neuropathy     BOTH FEET --COMES AND GOES  . Restless leg syndrome     DIAGNOSED BY SLEEP STUDY - PT TOLD SHE DID NOT HAVE SLEEP APNEA  . Rash     HANDS, ARMS --STATES HX OF RASH EVER SINCE CHILDBIRTH/PREGNANCY.  STATES THE RASH OFTEN OCCURS WHEN SHE IS REALLY STRESSED.  Marland Kitchen Hernia, incisional, RLQ, s/p lap repair Sep 2013 09/02/2011  . SBO (small bowel obstruction) 06/03/2012  . Back pain, chronic   . Leg pain, right     Social History:  History   Social History  . Marital Status: Married  Spouse Name: N/A    Number of Children: 2  . Years of Education: BA degree   Occupational History  .     Social History Main Topics  . Smoking status: Never Smoker   . Smokeless tobacco: Never Used  . Alcohol Use: No  . Drug Use: No  . Sexual Activity: Yes    Birth Control/ Protection: None   Other Topics Concern  . Not on file   Social History Narrative   Lives with her spouse, sister, mother-in-law, and 2 sone. BA degree. Married 16 yrs. Drinks 2 cups of coffee a month. No tobacco use. Quit alcohol use 2008. No illicit drug use. Right handed.    Medications:     Current Outpatient Prescriptions on File Prior to Visit  Medication Sig Dispense Refill  . Amlodipine-Valsartan-HCTZ 10-320-25 MG TABS Take 1 tablet by mouth every evening.       Marland Kitchen aspirin 325 MG tablet Take 325 mg by mouth daily.      . B-D ULTRAFINE III SHORT PEN 31G X 8 MM MISC       . Biotin 5000 MCG TABS Take 1 tablet by mouth daily.      Marland Kitchen BYSTOLIC 10 MG tablet Take 10 mg by mouth every morning.       . furosemide (LASIX) 40 MG tablet Take 20-40 mg by mouth daily as needed for fluid or edema.       . gabapentin (NEURONTIN) 300 MG capsule Take 300 mg by mouth 3 (three) times daily.      . insulin glargine (LANTUS) 100 UNIT/ML injection Inject 22 Units into the skin 2 (two) times daily.       . insulin lispro (HUMALOG) 100 UNIT/ML injection Inject 6-10 Units into the skin 3 (three) times daily before meals.       . INVOKANA 300 MG TABS Take 300 mg by mouth every morning.       . pregabalin (LYRICA) 25 MG capsule Take 25 mg by mouth 2 (two) times daily.      . Prenatal Vit-Fe Fumarate-FA (PRENATAL MULTIVITAMIN) TABS tablet Take 1 tablet by mouth daily at 12 noon.      . promethazine (PHENERGAN) 12.5 MG tablet Take 12.5 mg by mouth every 8 (eight) hours as needed for nausea.      Marland Kitchen tiZANidine (ZANAFLEX) 4 MG tablet TAKE 1 TABLET (4 MG TOTAL) BY MOUTH 2 (TWO) TIMES DAILY.  60 tablet  1  . traMADol (ULTRAM) 50 MG tablet Take 50 mg by mouth daily.        No current facility-administered medications on file prior to visit.    Allergies:   Allergies  Allergen Reactions  . Contrast Media [Iodinated Diagnostic Agents] Other (See Comments)    Difficulty breathing  . Iohexol Hives, Nausea And Vomiting and Swelling     Desc: Magnevist-gadolinium-difficulty breathing, throat swelling   . Midazolam Hcl Anaphylaxis    Difficulty breathing  . Shellfish Allergy Anaphylaxis  . Avandia [Rosiglitazone Maleate]     Patient doesn't remember reaction  . Geodon [Ziprasidone]   . Kiwi Extract Itching  and Swelling  . Metformin And Related Nausea And Vomiting and Other (See Comments)    diarrhea  . Other Itching    Patient is allergic to all nuts except peanuts.     Physical Exam General: Obese young African American lady seated, in no evident distress Head: head normocephalic and atraumatic. Orohparynx benign Neck: supple with no carotid or supraclavicular bruits  Cardiovascular: regular rate and rhythm, no murmurs Musculoskeletal: no deformity Skin:  no rash/petichiae Vascular:  Normal pulses all extremities Filed Vitals:   09/01/13 0925  BP: 122/65  Pulse: 71   Neurologic Exam Mental Status: Awake and fully alert. Oriented to place and time. Recent and remote memory intact. Attention span, concentration and fund of knowledge appropriate. Mood and affect appropriate.  Cranial Nerves: Fundoscopic exam reveals sharp disc margins. Pupils equal, briskly reactive to light. Extraocular movements full without nystagmus. Visual fields full to confrontation. Hearing intact. Facial sensation intact. Face, tongue, palate moves normally and symmetrically.  Motor: Normal bulk and tone. Normal strength in all tested extremity muscles. Sensory.: intact to touch and pinprick and vibratory sensation.  Coordination: Rapid alternating movements normal in all extremities. Finger-to-nose and heel-to-shin performed accurately bilaterally. Gait and Station: Arises from chair without difficulty. Stance is normal. Gait demonstrates normal stride length and balance . Able to heel, toe and tandem walk without difficulty.  Reflexes: 1+ and symmetric. Toes downgoing.    ASSESSMENT: 55 year African American lady with remote history of left pontine infarct in June 2011 due to small vessel disease with multiple vascular risk for hypertension, hyperlipidemia, diabetes and obesity. History of migraine headaches with new complaints of different headaches which sound like tension headaches.    PLAN: I had a long  discussion with the patient with regards to headaches which sound the sound like tension headaches. I advised her to increase participation regular activities for stress relaxation like walking, exercise medication and yoga She may use tramadol as needed for headaches. I do not believe further neurological testing is necessary. Continue aspirin for stroke prevention with strict control of hypertension with blood pressure goal below 130/90 and diabetes with hemoglobin A1c go to 6.5%. Return for followup in 6 months with Charlott Holler, NP. or earlier if necessary.    Note: This document was prepared with digital dictation and possible smart phrase technology. Any transcriptional errors that result from this process are unintentional

## 2013-09-01 NOTE — Patient Instructions (Signed)
I had a long discussion with the patient with regards to headaches which sound the sound like tension headaches. I advised her to increase participation regular activities for stress relaxation like walking, exercise medication and yoga She may use tramadol as needed for headaches. I do not believe further neurological testing is necessary. Continue aspirin for stroke prevention with strict control of hypertension with blood pressure goal below 130/90 and diabetes with hemoglobin A1c go to 6.5%. Return for followup in 6 months with Charlott Holler, NP. or earlier if necessary.

## 2013-09-02 ENCOUNTER — Ambulatory Visit (HOSPITAL_BASED_OUTPATIENT_CLINIC_OR_DEPARTMENT_OTHER): Payer: Federal, State, Local not specified - PPO | Admitting: Physical Medicine & Rehabilitation

## 2013-09-02 ENCOUNTER — Encounter: Payer: Federal, State, Local not specified - PPO | Attending: Physical Medicine & Rehabilitation

## 2013-09-02 ENCOUNTER — Encounter: Payer: Self-pay | Admitting: Physical Medicine & Rehabilitation

## 2013-09-02 VITALS — BP 144/62 | HR 73 | Resp 14 | Ht 66.0 in | Wt 240.0 lb

## 2013-09-02 DIAGNOSIS — R202 Paresthesia of skin: Secondary | ICD-10-CM

## 2013-09-02 DIAGNOSIS — G8110 Spastic hemiplegia affecting unspecified side: Secondary | ICD-10-CM | POA: Insufficient documentation

## 2013-09-02 DIAGNOSIS — R209 Unspecified disturbances of skin sensation: Secondary | ICD-10-CM

## 2013-09-02 MED ORDER — TRAMADOL HCL 50 MG PO TABS: 50.0000 mg | ORAL_TABLET | Freq: Every day | ORAL | Status: DC

## 2013-09-02 MED ORDER — TIZANIDINE HCL 4 MG PO TABS: 6.0000 mg | ORAL_TABLET | Freq: Two times a day (BID) | ORAL | Status: DC

## 2013-09-02 NOTE — Patient Instructions (Signed)
Start putting more weight on Right foot to reduced right back pain

## 2013-09-02 NOTE — Progress Notes (Signed)
Subjective:    Patient ID: Tina [name], female    DOB: [birth year], 43 y.o.   MRN: [id number]  HPI  Hernia mesh repair 02/04/2013, hospitalized until 02/09/2013. Postoperative fluid collection still seeing surgery  Lateral malleolus fracture while practicing walking with her sister. Had a near fall. Treated with a CAM walker boot by orthopedics. No longer wearing a boot. Fracture was in June   Pain Inventory Average Pain 6 Pain Right Now 6 My pain is sharp, burning, stabbing, tingling and aching  In the last 24 hours, has pain interfered with the following? General activity 7 Relation with others 6 Enjoyment of life 6 What TIME of day is your pain at its worst? daytime, evening Sleep (in general) Poor  Pain is worse with: walking, sitting and standing Pain improves with: rest, heat/ice and medication Relief from Meds: 4  Mobility walk without assistance walk with assistance use a cane how many minutes can you walk? 15 ability to climb steps?  no do you drive?  no transfers alone Do you have any goals in this area?  no  Function disabled: date disabled na I need assistance with the following:  bathing, meal prep, household duties and shopping  Neuro/Psych weakness numbness tingling spasms dizziness confusion depression anxiety  Prior Studies Any changes since last visit?  yes x-rays  Physicians involved in your care Any changes since last visit?  no   Family History  Problem Relation Age of Onset  . Diabetes Father   . Kidney disease Father   . Other Neg Hx   . Diabetes Maternal Grandmother   . Hyperlipidemia Paternal Grandmother   . Stroke Paternal Grandmother    History   Social History  . Marital Status: Married    Spouse Name: N/A    Number of Children: 2  . Years of Education: BA degree   Occupational History  .     Social History Main Topics  . Smoking status: Never Smoker   . Smokeless tobacco: Never Used  . Alcohol Use: No   . Drug Use: No  . Sexual Activity: Yes    Birth Control/ Protection: None   Other Topics Concern  . None   Social History Narrative   Lives with her spouse, sister, mother-in-law, and 2 sone. BA degree. Married 16 yrs. Drinks 2 cups of coffee a month. No tobacco use. Quit alcohol use 2008. No illicit drug use. Right handed.   Past Surgical History  Procedure Laterality Date  . Cesarean section  2012  . Uterine fibroid surgery      2 SURGERIES FOR FIBROIDS  . Ureter revision    . Ventral hernia repair  10/03/2011    Procedure: LAPAROSCOPIC VENTRAL HERNIA;  Surgeon: Adin Hector, MD;  Location: [facility];  Service: General;  Laterality: N/A;  . Hernia repair  10/03/11    ventral hernia repair  . Esophagogastroduodenoscopy N/A 06/03/2012    Procedure: ESOPHAGOGASTRODUODENOSCOPY (EGD);  Surgeon: [name] Craver, MD;  Location: [facility];  Service: Endoscopy;  Laterality: N/A;  . Diagnostic laparoscopy    . Umbilical hernia repair N/A 02/04/2013    Procedure: LAPAROSCOPIC ventral wall hernia repair LAPAROSCOPIC LYSIS OF ADHESIONS laparoscopic exploration of abdomen ;  Surgeon: Adin Hector, MD;  Location: [facility];  Service: General;  Laterality: N/A;  . Insertion of mesh N/A 02/04/2013    Procedure: INSERTION OF MESH;  Surgeon: Adin Hector, MD;  Location: [facility];  Service: General;  Laterality: N/A;  Past Medical History  Diagnosis Date  . Spastic hemiplegia affecting dominant side   . Panic disorder without agoraphobia   . Depression   . Anxiety   . Diabetes mellitus   . Hypertension   . Blood transfusion     IN 2012  AFTER C -SECTION  . Cerebral thrombosis with cerebral infarction JUNE 2011    RIGHT SIDED WEAKNESS ( ARM AND LEG ) AND SPASMS  . Stroke   . Right rotator cuff tear     PAIN IN RIGHT SHOULDER  . Ventral hernia     RIGHT LOWER QUADRANT-CAUSING SOME PAIN  . Anemia     DURING MENSES--HAS HEAVY BLEEDING WITH PERODS  . Headache(784.0)     MIGRAINES--NOT REALLY  HEADACHE-MORE LIKE PRESSURE SENSATION IN HEAD-FEELS DIZZIY AND  FAINT AS THE PRESSURE RESOLVES  . Diabetic neuropathy     BOTH FEET --COMES AND GOES  . Restless leg syndrome     DIAGNOSED BY SLEEP STUDY - PT TOLD SHE DID NOT HAVE SLEEP APNEA  . Rash     HANDS, ARMS --STATES HX OF RASH EVER SINCE CHILDBIRTH/PREGNANCY.  STATES THE RASH OFTEN OCCURS WHEN SHE IS REALLY STRESSED.  Marland Kitchen Hernia, incisional, RLQ, s/p lap repair Sep 2013 09/02/2011  . SBO (small bowel obstruction) 06/03/2012  . Back pain, chronic   . Leg pain, right    BP 144/62  Pulse 73  Resp 14  Ht 5\' 6"  (1.676 m)  Wt 240 lb (108.863 kg)  BMI 38.76 kg/m2  SpO2 99%  Opioid Risk Score:   Fall Risk Score: Moderate Fall Risk (6-13 points) (pt educated, declined handout)    Review of Systems  Constitutional: Positive for diaphoresis and unexpected weight change.  Cardiovascular: Positive for leg swelling.  Musculoskeletal: Positive for back pain.  Neurological: Positive for dizziness, tremors, weakness and numbness.       Spasms, tingling  Psychiatric/Behavioral: The patient is nervous/anxious.        Depression  All other systems reviewed and are negative.      Objective:   Physical Exam  Nursing note and vitals reviewed. Constitutional: She is oriented to person, place, and time. She appears well-developed and well-nourished.  HENT:  Head: Normocephalic and atraumatic.  Eyes: Conjunctivae and EOM are normal. Pupils are equal, round, and reactive to light.  Neurological: She is alert and oriented to person, place, and time. A sensory deficit is present. Gait abnormal.  5/5 in BUE 4/5 R HF, KE, 3-/5 ankle DF/PF  Numbness to LT R distal foot  Pain to palp R Lat malleolus  R shoulder limited to 90 degrees abd  Psychiatric: She has a normal mood and affect.          Assessment & Plan:  1. Right hemiparesis secondary to history of CVA. She still has some problems with balance. Still some decreased sensation  on the right side as well.  2. Right lateral malleolus nondisplaced fracture now full weight-bearing still has some pain and is limiting her weight-bearing on that  3. Right-sided low back pain related to trying to keep weight off the right foot. This is over using the back and hip muscles. Advised stretching as well as strengthening hip muscles and starting to put more weight on the right foot  Continue tramadol 50 mg in the morning Increased Zanaflex 6 mg twice a day

## 2013-09-05 ENCOUNTER — Ambulatory Visit (INDEPENDENT_AMBULATORY_CARE_PROVIDER_SITE_OTHER): Payer: Federal, State, Local not specified - PPO | Admitting: Surgery

## 2013-09-05 ENCOUNTER — Other Ambulatory Visit (HOSPITAL_COMMUNITY): Payer: Self-pay | Admitting: Endocrinology

## 2013-09-05 VITALS — BP 110/66 | HR 79 | Temp 98.5°F | Ht 66.0 in | Wt 241.5 lb

## 2013-09-05 DIAGNOSIS — T792XXD Traumatic secondary and recurrent hemorrhage and seroma, subsequent encounter: Secondary | ICD-10-CM

## 2013-09-05 DIAGNOSIS — K432 Incisional hernia without obstruction or gangrene: Secondary | ICD-10-CM

## 2013-09-05 DIAGNOSIS — R209 Unspecified disturbances of skin sensation: Secondary | ICD-10-CM

## 2013-09-05 DIAGNOSIS — IMO0002 Reserved for concepts with insufficient information to code with codable children: Secondary | ICD-10-CM | POA: Diagnosis not present

## 2013-09-05 DIAGNOSIS — R1031 Right lower quadrant pain: Secondary | ICD-10-CM

## 2013-09-05 DIAGNOSIS — T888XXD Other specified complications of surgical and medical care, not elsewhere classified, subsequent encounter: Secondary | ICD-10-CM

## 2013-09-05 DIAGNOSIS — E669 Obesity, unspecified: Secondary | ICD-10-CM | POA: Diagnosis not present

## 2013-09-05 DIAGNOSIS — M5430 Sciatica, unspecified side: Secondary | ICD-10-CM

## 2013-09-05 DIAGNOSIS — Z1231 Encounter for screening mammogram for malignant neoplasm of breast: Secondary | ICD-10-CM

## 2013-09-05 DIAGNOSIS — G8929 Other chronic pain: Secondary | ICD-10-CM

## 2013-09-05 DIAGNOSIS — R202 Paresthesia of skin: Secondary | ICD-10-CM

## 2013-09-05 NOTE — Progress Notes (Signed)
Subjective:     Patient ID: Tina [name], female   DOB: 14-Oct-1970, 43 y.o.   MRN: [id number]  HPI   Note: This dictation was prepared with Dragon/digital dictation along with [facility] technology. Any transcriptional errors that result from this process are unintentional.       Cameka MAGDELENE RUARK  [birth year] [id number]  Patient Care Team: Anda Kraft, MD as PCP - General Sheronette Clint Bolder, MD as Consulting Physician (Obstetrics and Gynecology) Antony Contras, MD as Consulting Physician (Neurology) [name] Craver, MD as Consulting Physician (Gastroenterology)  Procedure (Date: 02/04/2013):  POST-OPERATIVE DIAGNOSIS: Recurrent ventral wall incisional hernia   PROCEDURE: Procedure(s):  LAPAROSCOPIC LYSIS OF ADHESIONS 64min (1/2 case)  LAPAROSCOPIC ventral wall hernia repair  INSERTION OF MESH   SURGEON: Surgeon(s):  Adin Hector, MD  OR FINDINGS: Poor incorporation of central part of mesh with persistent hernia sac. Seromas. No abscess. Gram stain negative for organisms.  8 x 7 recurrent area of hernia. Outer 2/3 rim of old Physiomesh well incorporated.  Dense adhesions of small bowel to central part of mesh. Some incorporation. No fistula.  Type of repair - Laparoscopic underlay repair  Name of mesh - Bard Ventralight dual sided (polypropylene / Seprafilm)  Size of mesh - Length 33 cm, Width 25 cm  Mesh overlap - 7-10 cm  Placement of mesh - Intraperitoneal underlay repair   Anaerobic culture   Status: Final result Visible to patient: This result is viewable by the patient in [place]. Next appt: 08/15/2013 at 02:45 PM in Physical Medicine and Rehabilitation [name], MD)           3wk ago    Specimen Description ABSCESS ABDOMEN HERNIA [place]     Special Requests PATIENT ON FOLLOWING FLAGYL     Gram Stain RARE WBC NO ORGANISMS SEEN Performed at [facility]    Culture NO ANAEROBES ISOLATED Performed at [facility]    Report Status  02/09/2013 FINAL     Resulting Agency [facility]   This patient returns for surgical re-evaluation.  She underwent aspiration of the fluid collection earlier this year.  Looked clear.  Cultures negative.  She feels like the lump has persisted.  She thinks it is larger.  She feels pulling on the right lower side from time to time.  She likes to wear the binder.  She wonders if the fluid needs to be removed again.  She still has chronic pain Involving mostly the right side of her body were given her history of paresthesias and hemiplegia.  Followed by neurology.  Medications have been adjusted recently.  Seems to walk fine line between being over sedated and in pain.  Overall improving/stabilizing.  Patient Active Problem List   Diagnosis Date Noted  . Tension headache 09/01/2013  . Seroma, postoperative 05/04/2013  . Nausea with vomiting ?Gastroparesis? 03/21/2013  . Spastic hemiplegia affecting dominant side 02/16/2013  . Recurrent ventral incisional hernia s/p closure/repair w mesh 02/04/2013 01/26/2013  . Constipation, chronic 01/26/2013  . Abdominal pain, chronic, right lower quadrant 06/03/2012  . History of CVA (cerebrovascular accident) 06/03/2012  . HTN (hypertension) 06/03/2012  . Hx of migraines 10/19/2011  . Diabetes mellitus type II, uncontrolled 10/16/2011  . Obesity (BMI 30-39.9) 09/02/2011  . Rotator cuff tear 08/25/2011  . Sciatica 06/10/2011  . Paresthesias in right hand 06/10/2011  . Lumbago 05/09/2011  . Paresthesias 05/09/2011    Past Medical History  Diagnosis Date  . Spastic hemiplegia affecting dominant side   .  Panic disorder without agoraphobia   . Depression   . Anxiety   . Diabetes mellitus   . Hypertension   . Blood transfusion     IN 2012  AFTER C -SECTION  . Cerebral thrombosis with cerebral infarction JUNE 2011    RIGHT SIDED WEAKNESS ( ARM AND LEG ) AND SPASMS  . Stroke   . Right rotator cuff tear     PAIN IN RIGHT SHOULDER  . Ventral hernia      RIGHT LOWER QUADRANT-CAUSING SOME PAIN  . Anemia     DURING MENSES--HAS HEAVY BLEEDING WITH PERODS  . Headache(784.0)     MIGRAINES--NOT REALLY HEADACHE-MORE LIKE PRESSURE SENSATION IN HEAD-FEELS DIZZIY AND  FAINT AS THE PRESSURE RESOLVES  . Diabetic neuropathy     BOTH FEET --COMES AND GOES  . Restless leg syndrome     DIAGNOSED BY SLEEP STUDY - PT TOLD SHE DID NOT HAVE SLEEP APNEA  . Rash     HANDS, ARMS --STATES HX OF RASH EVER SINCE CHILDBIRTH/PREGNANCY.  STATES THE RASH OFTEN OCCURS WHEN SHE IS REALLY STRESSED.  Marland Kitchen Hernia, incisional, RLQ, s/p lap repair Sep 2013 09/02/2011  . SBO (small bowel obstruction) 06/03/2012  . Back pain, chronic   . Leg pain, right     Past Surgical History  Procedure Laterality Date  . Cesarean section  2012  . Uterine fibroid surgery      2 SURGERIES FOR FIBROIDS  . Ureter revision    . Ventral hernia repair  10/03/2011    Procedure: LAPAROSCOPIC VENTRAL HERNIA;  Surgeon: Adin Hector, MD;  Location: [facility];  Service: General;  Laterality: N/A;  . Hernia repair  10/03/11    ventral hernia repair  . Esophagogastroduodenoscopy N/A 06/03/2012    Procedure: ESOPHAGOGASTRODUODENOSCOPY (EGD);  Surgeon: [name] Craver, MD;  Location: [facility];  Service: Endoscopy;  Laterality: N/A;  . Diagnostic laparoscopy    . Umbilical hernia repair N/A 02/04/2013    Procedure: LAPAROSCOPIC ventral wall hernia repair LAPAROSCOPIC LYSIS OF ADHESIONS laparoscopic exploration of abdomen ;  Surgeon: Adin Hector, MD;  Location: [facility];  Service: General;  Laterality: N/A;  . Insertion of mesh N/A 02/04/2013    Procedure: INSERTION OF MESH;  Surgeon: Adin Hector, MD;  Location: [facility];  Service: General;  Laterality: N/A;    History   Social History  . Marital Status: Married    Spouse Name: N/A    Number of Children: 2  . Years of Education: BA degree   Occupational History  .     Social History Main Topics  . Smoking status: Never Smoker   . Smokeless  tobacco: Never Used  . Alcohol Use: No  . Drug Use: No  . Sexual Activity: Yes    Birth Control/ Protection: None   Other Topics Concern  . Not on file   Social History Narrative   Lives with her spouse, sister, mother-in-law, and 2 sone. BA degree. Married 16 yrs. Drinks 2 cups of coffee a month. No tobacco use. Quit alcohol use 2008. No illicit drug use. Right handed.    Family History  Problem Relation Age of Onset  . Diabetes Father   . Kidney disease Father   . Other Neg Hx   . Diabetes Maternal Grandmother   . Hyperlipidemia Paternal Grandmother   . Stroke Paternal Grandmother     Current Outpatient Prescriptions  Medication Sig Dispense Refill  . Amlodipine-Valsartan-HCTZ 10-320-25 MG TABS Take 1 tablet by  mouth every evening.       Marland Kitchen aspirin 325 MG tablet Take 325 mg by mouth daily.      . B-D ULTRAFINE III SHORT PEN 31G X 8 MM MISC       . Biotin 5000 MCG TABS Take 1 tablet by mouth daily.      Marland Kitchen BYSTOLIC 10 MG tablet Take 10 mg by mouth every morning.       . furosemide (LASIX) 40 MG tablet Take 20-40 mg by mouth daily as needed for fluid or edema.       . insulin glargine (LANTUS) 100 UNIT/ML injection Inject 22 Units into the skin 2 (two) times daily.       . insulin lispro (HUMALOG) 100 UNIT/ML injection Inject 6-10 Units into the skin 3 (three) times daily before meals.       . INVOKANA 300 MG TABS Take 300 mg by mouth every morning.       . pregabalin (LYRICA) 25 MG capsule Take 25 mg by mouth 2 (two) times daily.      . Prenatal Vit-Fe Fumarate-FA (PRENATAL MULTIVITAMIN) TABS tablet Take 1 tablet by mouth daily at 12 noon.      . promethazine (PHENERGAN) 12.5 MG tablet Take 12.5 mg by mouth every 8 (eight) hours as needed for nausea.      Marland Kitchen tiZANidine (ZANAFLEX) 4 MG tablet Take 1.5 tablets (6 mg total) by mouth 2 (two) times daily after a meal.  90 tablet  1  . traMADol (ULTRAM) 50 MG tablet Take 1 tablet (50 mg total) by mouth daily.  30 tablet  5   No  current facility-administered medications for this visit.     Allergies  Allergen Reactions  . Contrast Media [Iodinated Diagnostic Agents] Other (See Comments)    Difficulty breathing  . Iohexol Hives, Nausea And Vomiting and Swelling     Desc: Magnevist-gadolinium-difficulty breathing, throat swelling   . Midazolam Hcl Anaphylaxis    Difficulty breathing  . Shellfish Allergy Anaphylaxis  . Avandia [Rosiglitazone Maleate]     Patient doesn't remember reaction  . Geodon [Ziprasidone]   . Kiwi Extract Itching and Swelling  . Metformin And Related Nausea And Vomiting and Other (See Comments)    diarrhea  . Other Itching    Patient is allergic to all nuts except peanuts.     BP 110/66  Pulse 79  Temp(Src) 98.5 F (36.9 C) (Oral)  Ht 5\' 6"  (1.676 m)  Wt 241 lb 8 oz (109.544 kg)  BMI 39.00 kg/m2  No results found.   Review of Systems  Constitutional: Negative for fever, chills and diaphoresis.  HENT: Negative for ear pain, sore throat and trouble swallowing.   Eyes: Negative for photophobia and visual disturbance.  Respiratory: Negative for cough and choking.   Cardiovascular: Negative for chest pain and palpitations.  Gastrointestinal: Negative for nausea, vomiting, abdominal pain, diarrhea, constipation, anal bleeding and rectal pain.  Genitourinary: Negative for dysuria, frequency and difficulty urinating.  Musculoskeletal: Negative for gait problem and myalgias.  Skin: Negative for color change, pallor and rash.  Neurological: Negative for dizziness, speech difficulty, weakness and numbness.  Hematological: Negative for adenopathy.  Psychiatric/Behavioral: Negative for confusion and agitation. The patient is not nervous/anxious.        Objective:   Physical Exam  Constitutional: She is oriented to person, place, and time. She appears well-developed and well-nourished. No distress.  HENT:  Head: Normocephalic.  Mouth/Throat: Oropharynx is clear and moist. No  oropharyngeal exudate.  Eyes: Conjunctivae and EOM are normal. Pupils are equal, round, and reactive to light. No scleral icterus.  Neck: Normal range of motion. No tracheal deviation present.  Cardiovascular: Normal rate and intact distal pulses.   Pulmonary/Chest: Effort normal. No respiratory distress. She exhibits no tenderness.  Abdominal: Soft. She exhibits no distension. There is no tenderness. There is no rigidity, no rebound, no guarding, no tenderness at McBurney's point and negative Murphy's sign. No hernia. Hernia confirmed negative in the ventral area, confirmed negative in the right inguinal area and confirmed negative in the left inguinal area.    Incisions clean with normal healing ridges.  No hernias  Genitourinary: No vaginal discharge found.  Musculoskeletal: Normal range of motion. She exhibits no tenderness.  Lymphadenopathy:       Right: No inguinal adenopathy present.       Left: No inguinal adenopathy present.  Neurological: She is alert and oriented to person, place, and time. No cranial nerve deficit. She exhibits normal muscle tone. Coordination normal.  Skin: Skin is warm and dry. No rash noted. She is not diaphoretic.  Psychiatric: She has a normal mood and affect. Her behavior is normal.       Assessment:     Marked improvement of symptoms although with recurrence abdominal wall seroma (now smaller)    Plan:     I did offer the patient several options.  The most aggressive option is repeat aspirations of the seroma with drain placement.  That gives the a chance of resolution.  It may not work.  I would hesitate to give apparent tracking sacs excised the seroma as increased risk of infection and increased chance of new seroma formation.  However, he probably will require repeat aspirations and may not completely resolve things.  There is an increased risk of mesh infection.  Less likely now that she is more than 3 months.  I would hesitate to leave a drain  in.  Other option is to improve pain control.  Restart Tylenol given her intolerance to other nonsteroidals.  Continue binder.  To try to avoid narcotics.  Already wiped out from the Lyrica and Zanaflex.  Losing weight will be helpful.  A lot of this is her panniculus pulling in the region and keeping it from calming down.  Part of her medication changes are to be able to avoid water retention and weight gain.  Hoping to lose more weight. Low impact exercise such as walking an hour a day at least ideal.  Do not push through pain.    She would like to try the tylenol first and if that does not work to reconsider aspirations.  May consider that under ultrasound vs. Repeat CT scan to make sure no hernia recurrence or other problems.  She will let me know on how things are going.   DM control  Diet as tolerated.  Low fat high fiber diet ideal.  Bowel regimen with 30 g fiber a day and fiber supplement as needed to avoid problems.    Instructions discussed.  Followup with primary care physician for other health issues as would normally be done.  Consider screening for malignancies (breast, prostate, colon, melanoma, etc) as appropriate.  Questions answered.  The patient expressed understanding and appreciation

## 2013-09-05 NOTE — Patient Instructions (Signed)
Managing Pain  Pain after surgery or related to activity is often due to strain/injury to muscle, tendon, nerves and/or incisions.  This pain is usually short-term and will improve in a few months.   Many people find it helpful to do the following things TOGETHER to help speed the process of healing and to get back to regular activity more quickly:  1. Avoid heavy physical activity a.  no lifting greater than 20 pounds b. Do not "push through" the pain.  Listen to your body and avoid positions and maneuvers than reproduce the pain c. Walking is okay as tolerated, but go slowly and stop when getting sore.  d. Remember: If it hurts to do it, then don't do it! 2. Take Anti-inflammatory medication  a. Take with food/snack around the clock for 1-2 weeks i. This helps the muscle and nerve tissues become less irritable and calm down faster ii. Choose Acetaminophen 500mg  tabs (Tylenol) 2 pills with every meal and just before bedtime 3. Use a Heating pad or Ice/Cold Pack a. 4-6 times a day b. May use warm bath/hottub  or showers 4. Try Gentle Massage and/or Stretching  a. at the area of pain many times a day b. stop if you feel pain - do not overdo it  Try these steps together to help you body heal faster and avoid making things get worse.  Doing just one of these things may not be enough.    If you are not getting better after two weeks or are noticing you are getting worse, contact our office for further advice; we may need to re-evaluate you & see what other things we can do to help.  Seroma A seroma is a collection of fluid that looks like swelling or a mass on the body. Seromas form on the body where tissue has been injured or cut. They are most common after surgeries. Seromas vary in size. Some are small and painless. Others may become large and cause pain or discomfort. Many seromas go away on their own; the fluid is naturally absorbed by the body. Some may require the fluid to be drained  through medical procedures.  CAUSES  Seromas form as the result of damage to tissue or the removal of tissue. This tissue damage may occur during surgery or because of an injury or trauma. When tissue is disrupted or removed, empty space is created. The body's natural defense system causes fluid to enter the empty space and form a seroma. SYMPTOMS   Swelling at the site of a surgical cut (incision) or an injury.  Drainage of clear fluid at the surgery or injury site.  Possible discomfort or pain. DIAGNOSIS  Your health care provider will perform a physical exam. During the exam, the health care provider will press on the seroma using a hand or fingers (palpation). Various tests may be ordered to help confirm the diagnosis. These tests may include:  Blood tests.  Imaging tests such as ultrasonography or computed tomography (CT). TREATMENT  Sometimes seromas resolve on their own and drain naturally in the body. Your health care provider may monitor you to make sure the seroma does not cause any complications. If your seroma does not resolve on its own, treatment may include:  Using a needle to drain the fluid from the seroma (needle aspiration).  Inserting a flexible tube (catheter) to drain the fluid.  Applying a dressing, such as an elastic bandage or binder.  Use of antibiotic medicines if the seroma becomes  infected.  In rare cases, surgery may be done to remove the seroma and repair the area. HOME CARE INSTRUCTIONS  Follow your health care provider's instructions regarding activity levels and any limitations on movements.  Only take over-the-counter or prescription medicines as directed by your health care provider.  If your health care provider prescribes antibiotics, take them as directed. Finish them even if you start to feel better.  Check your seroma every day for redness, warmth, or yellow drainage.  Follow up with your health care provider as directed. SEEK MEDICAL  CARE IF:  You develop a fever.  You have pain, tenderness, redness, or warmth at the site of the seroma.  You notice yellow drainage coming from the site of the seroma.  Your seroma is getting bigger. Document Released: 04/26/2012 Document Revised: 05/16/2013 Document Reviewed: 04/26/2012 [facility] Patient Information 2015 [place], [place]. This information is not intended to replace advice given to you by your health care provider. Make sure you discuss any questions you have with your health care provider.  HERNIA REPAIR: POST OP INSTRUCTIONS  1. DIET: Follow a light bland diet the first 24 hours after arrival home, such as soup, liquids, crackers, etc.  Be sure to include lots of fluids daily.  Avoid fast food or heavy meals as your are more likely to get nauseated.  Eat a low fat the next few days after surgery. 2. Take your usually prescribed home medications unless otherwise directed. 3. PAIN CONTROL: a. Pain is best controlled by a usual combination of three different methods TOGETHER: i. Ice/Heat ii. Over the counter pain medication iii. Prescription pain medication b. Most patients will experience some swelling and bruising around the hernia(s) such as the bellybutton, groins, or old incisions.  Ice packs or heating pads (30-60 minutes up to 6 times a day) will help. Use ice for the first few days to help decrease swelling and bruising, then switch to heat to help relax tight/sore spots and speed recovery.  Some people prefer to use ice alone, heat alone, alternating between ice & heat.  Experiment to what works for you.  Swelling and bruising can take several weeks to resolve.   c. It is helpful to take an over-the-counter pain medication regularly for the first few weeks.  Choose one of the following that works best for you: i. Naproxen (Aleve, etc)  Two 220mg  tabs twice a day ii. Ibuprofen (Advil, etc) Three 200mg  tabs four times a day (every meal & bedtime) iii. Acetaminophen  (Tylenol, etc) 325-650mg  four times a day (every meal & bedtime) d. A  prescription for pain medication should be given to you upon discharge.  Take your pain medication as prescribed.  i. If you are having problems/concerns with the prescription medicine (does not control pain, nausea, vomiting, rash, itching, etc), please call us [phone] to see if we need to switch you to a different pain medicine that will work better for you and/or control your side effect better. ii. If you need a refill on your pain medication, please contact your pharmacy.  They will contact our office to request authorization. Prescriptions will not be filled after 5 pm or on week-ends. 4. Avoid getting constipated.  Between the surgery and the pain medications, it is common to experience some constipation.  Increasing fluid intake and taking a fiber supplement (such as Metamucil, Citrucel, FiberCon, MiraLax, etc) 1-2 times a day regularly will usually help prevent this problem from occurring.  A mild laxative (prune juice,  Milk of Magnesia, MiraLax, etc) should be taken according to package directions if there are no bowel movements after 48 hours.   5. Wash / shower every day.  You may shower over the dressings as they are waterproof.   6. Remove your waterproof bandages 5 days after surgery.  You may leave the incision open to air.  You may replace a dressing/Band-Aid to cover the incision for comfort if you wish.  Continue to shower over incision(s) after the dressing is off.    7. ACTIVITIES as tolerated:   a. You may resume regular (light) daily activities beginning the next day-such as daily self-care, walking, climbing stairs-gradually increasing activities as tolerated.  If you can walk 30 minutes without difficulty, it is safe to try more intense activity such as jogging, treadmill, bicycling, low-impact aerobics, swimming, etc. b. Save the most intensive and strenuous activity for last such as sit-ups, heavy  lifting, contact sports, etc  Refrain from any heavy lifting or straining until you are off narcotics for pain control.   c. DO NOT PUSH THROUGH PAIN.  Let pain be your guide: If it hurts to do something, don't do it.  Pain is your body warning you to avoid that activity for another week until the pain goes down. d. You may drive when you are no longer taking prescription pain medication, you can comfortably wear a seatbelt, and you can safely maneuver your car and apply brakes. e. Dennis Bast may have sexual intercourse when it is comfortable.  8. FOLLOW UP in our office a. Please call [facility] at [phone] to set up an appointment to see your surgeon in the office for a follow-up appointment approximately 2-3 weeks after your surgery. b. Make sure that you call for this appointment the day you arrive home to insure a convenient appointment time. 9.  IF YOU HAVE DISABILITY OR FAMILY LEAVE FORMS, BRING THEM TO THE OFFICE FOR PROCESSING.  DO NOT GIVE THEM TO YOUR DOCTOR.  WHEN TO CALL us [phone]: 1. Poor pain control 2. Reactions / problems with new medications (rash/itching, nausea, etc)  3. Fever over 101.5 F (38.5 C) 4. Inability to urinate 5. Nausea and/or vomiting 6. Worsening swelling or bruising 7. Continued bleeding from incision. 8. Increased pain, redness, or drainage from the incision   The clinic staff is available to answer your questions during regular business hours (8:30am-5pm).  Please don't hesitate to call and ask to speak to one of our nurses for clinical concerns.   If you have a medical emergency, go to the nearest emergency room or call 911.  A surgeon from [facility] is always on call at the hospitals in [facility], [place], [place], [place], [place]  [address] ?  [address] [address], [place], [place]   [address] MAIN: [phone] ? TOLL FREE: [phone] ? FAX: [phone] [email or web address]

## 2013-09-06 ENCOUNTER — Encounter (INDEPENDENT_AMBULATORY_CARE_PROVIDER_SITE_OTHER): Payer: Self-pay | Admitting: Surgery

## 2013-09-14 ENCOUNTER — Ambulatory Visit (HOSPITAL_COMMUNITY)
Admission: RE | Admit: 2013-09-14 | Discharge: 2013-09-14 | Disposition: A | Discharge status: Home / Self Care | Payer: Federal, State, Local not specified - PPO | Source: Ambulatory Visit | Attending: Endocrinology | Admitting: Endocrinology

## 2013-09-14 DIAGNOSIS — Z1231 Encounter for screening mammogram for malignant neoplasm of breast: Secondary | ICD-10-CM

## 2013-09-14 DIAGNOSIS — E119 Type 2 diabetes mellitus without complications: Secondary | ICD-10-CM | POA: Diagnosis not present

## 2013-09-14 DIAGNOSIS — I10 Essential (primary) hypertension: Secondary | ICD-10-CM | POA: Diagnosis not present

## 2013-09-28 ENCOUNTER — Other Ambulatory Visit: Payer: Self-pay | Admitting: Physical Medicine & Rehabilitation

## 2013-10-07 ENCOUNTER — Emergency Department (HOSPITAL_COMMUNITY)
Admission: EM | Admit: 2013-10-07 | Discharge: 2013-10-07 | Disposition: A | Discharge status: Home / Self Care | Payer: Federal, State, Local not specified - PPO | Attending: Emergency Medicine | Admitting: Emergency Medicine

## 2013-10-07 ENCOUNTER — Emergency Department (HOSPITAL_COMMUNITY): Payer: Federal, State, Local not specified - PPO

## 2013-10-07 ENCOUNTER — Encounter (HOSPITAL_COMMUNITY): Payer: Self-pay | Admitting: Emergency Medicine

## 2013-10-07 DIAGNOSIS — E1149 Type 2 diabetes mellitus with other diabetic neurological complication: Secondary | ICD-10-CM | POA: Insufficient documentation

## 2013-10-07 DIAGNOSIS — Z7982 Long term (current) use of aspirin: Secondary | ICD-10-CM | POA: Insufficient documentation

## 2013-10-07 DIAGNOSIS — I10 Essential (primary) hypertension: Secondary | ICD-10-CM | POA: Diagnosis not present

## 2013-10-07 DIAGNOSIS — Z862 Personal history of diseases of the blood and blood-forming organs and certain disorders involving the immune mechanism: Secondary | ICD-10-CM | POA: Insufficient documentation

## 2013-10-07 DIAGNOSIS — G8929 Other chronic pain: Secondary | ICD-10-CM | POA: Diagnosis not present

## 2013-10-07 DIAGNOSIS — Z8673 Personal history of transient ischemic attack (TIA), and cerebral infarction without residual deficits: Secondary | ICD-10-CM | POA: Insufficient documentation

## 2013-10-07 DIAGNOSIS — Z8719 Personal history of other diseases of the digestive system: Secondary | ICD-10-CM | POA: Insufficient documentation

## 2013-10-07 DIAGNOSIS — F411 Generalized anxiety disorder: Secondary | ICD-10-CM | POA: Insufficient documentation

## 2013-10-07 DIAGNOSIS — F3289 Other specified depressive episodes: Secondary | ICD-10-CM | POA: Insufficient documentation

## 2013-10-07 DIAGNOSIS — Z79899 Other long term (current) drug therapy: Secondary | ICD-10-CM | POA: Diagnosis not present

## 2013-10-07 DIAGNOSIS — Z87828 Personal history of other (healed) physical injury and trauma: Secondary | ICD-10-CM | POA: Insufficient documentation

## 2013-10-07 DIAGNOSIS — E1142 Type 2 diabetes mellitus with diabetic polyneuropathy: Secondary | ICD-10-CM | POA: Insufficient documentation

## 2013-10-07 DIAGNOSIS — Z8669 Personal history of other diseases of the nervous system and sense organs: Secondary | ICD-10-CM | POA: Insufficient documentation

## 2013-10-07 DIAGNOSIS — R42 Dizziness and giddiness: Secondary | ICD-10-CM | POA: Diagnosis not present

## 2013-10-07 DIAGNOSIS — Z794 Long term (current) use of insulin: Secondary | ICD-10-CM | POA: Diagnosis not present

## 2013-10-07 DIAGNOSIS — R079 Chest pain, unspecified: Secondary | ICD-10-CM | POA: Insufficient documentation

## 2013-10-07 DIAGNOSIS — F329 Major depressive disorder, single episode, unspecified: Secondary | ICD-10-CM | POA: Insufficient documentation

## 2013-10-07 LAB — BASIC METABOLIC PANEL
Anion gap: 16 — ABNORMAL HIGH (ref 5–15)
BUN: 28 mg/dL — AB (ref 6–23)
CO2: 22 mEq/L (ref 19–32)
Calcium: 9.4 mg/dL (ref 8.4–10.5)
Chloride: 100 mEq/L (ref 96–112)
Creatinine, Ser: 1.29 mg/dL — ABNORMAL HIGH (ref 0.50–1.10)
GFR calc Af Amer: 57 mL/min — ABNORMAL LOW (ref >90)
GFR, EST NON AFRICAN AMERICAN: 50 mL/min — AB (ref >90)
Glucose, Bld: 145 mg/dL — ABNORMAL HIGH (ref 70–99)
POTASSIUM: 3.6 meq/L — AB (ref 3.7–5.3)
SODIUM: 138 meq/L (ref 137–147)

## 2013-10-07 LAB — CBC
HCT: 38.9 % (ref 36.0–46.0)
Hemoglobin: 13.2 g/dL (ref 12.0–15.0)
MCH: 26.6 pg (ref 26.0–34.0)
MCHC: 33.9 g/dL (ref 30.0–36.0)
MCV: 78.4 fL (ref 78.0–100.0)
PLATELETS: 276 10*3/uL (ref 150–400)
RBC: 4.96 MIL/uL (ref 3.87–5.11)
RDW: 14.1 % (ref 11.5–15.5)
WBC: 7.1 10*3/uL (ref 4.0–10.5)

## 2013-10-07 LAB — TROPONIN I

## 2013-10-07 MED ORDER — LORAZEPAM 2 MG/ML IJ SOLN
1.0000 mg | Freq: Once | INTRAMUSCULAR | Status: AC
  Administered 2013-10-07: 1 mg via INTRAVENOUS
  Filled 2013-10-07: qty 1

## 2013-10-07 MED ORDER — ACETAMINOPHEN 325 MG PO TABS
650.0000 mg | ORAL_TABLET | Freq: Once | ORAL | Status: AC
  Administered 2013-10-07: 650 mg via ORAL
  Filled 2013-10-07: qty 2

## 2013-10-07 NOTE — ED Provider Notes (Signed)
CSN: [id number]     Arrival date & time 10/07/13  1532 History   First MD Initiated Contact with Patient 10/07/13 1600     Chief Complaint  Patient presents with  . Dizziness     (Consider location/radiation/quality/duration/timing/severity/associated sxs/prior Treatment) Patient is a 43 y.o. female presenting with dizziness. The history is provided by the patient.  Dizziness  She presents for evaluation of headache, dizziness, chest pain, blurred and double vision, nausea, and vomiting, all of which started around 11 AM today. She came here for evaluation by private vehicle. She is taking her usual medications, without relief. She denies recent fever, chills, cough, shortness of breath, back, pain or difficulty walking. She has a history of stroke, 3 years ago, and is worried that she might be having another stroke. There are no other known modifying factors.  Past Medical History  Diagnosis Date  . Spastic hemiplegia affecting dominant side   . Panic disorder without agoraphobia   . Depression   . Anxiety   . Diabetes mellitus   . Hypertension   . Blood transfusion     IN 2012  AFTER C -SECTION  . Cerebral thrombosis with cerebral infarction JUNE 2011    RIGHT SIDED WEAKNESS ( ARM AND LEG ) AND SPASMS  . Stroke   . Right rotator cuff tear     PAIN IN RIGHT SHOULDER  . Ventral hernia     RIGHT LOWER QUADRANT-CAUSING SOME PAIN  . Anemia     DURING MENSES--HAS HEAVY BLEEDING WITH PERODS  . Headache(784.0)     MIGRAINES--NOT REALLY HEADACHE-MORE LIKE PRESSURE SENSATION IN HEAD-FEELS DIZZIY AND  FAINT AS THE PRESSURE RESOLVES  . Diabetic neuropathy     BOTH FEET --COMES AND GOES  . Restless leg syndrome     DIAGNOSED BY SLEEP STUDY - PT TOLD SHE DID NOT HAVE SLEEP APNEA  . Rash     HANDS, ARMS --STATES HX OF RASH EVER SINCE CHILDBIRTH/PREGNANCY.  STATES THE RASH OFTEN OCCURS WHEN SHE IS REALLY STRESSED.  Marland Kitchen Hernia, incisional, RLQ, s/p lap repair Sep 2013 09/02/2011  . SBO  (small bowel obstruction) 06/03/2012  . Back pain, chronic   . Leg pain, right   . Hx of migraines 10/19/2011   Past Surgical History  Procedure Laterality Date  . Cesarean section  2012  . Uterine fibroid surgery      2 SURGERIES FOR FIBROIDS  . Ureter revision    . Ventral hernia repair  10/03/2011    Procedure: LAPAROSCOPIC VENTRAL HERNIA;  Surgeon: Adin Hector, MD;  Location: [facility];  Service: General;  Laterality: N/A;  . Hernia repair  10/03/11    ventral hernia repair  . Esophagogastroduodenoscopy N/A 06/03/2012    Procedure: ESOPHAGOGASTRODUODENOSCOPY (EGD);  Surgeon: [name] Craver, MD;  Location: [facility] ENDOSCOPY;  Service: Endoscopy;  Laterality: N/A;  . Diagnostic laparoscopy    . Umbilical hernia repair N/A 02/04/2013    Procedure: LAPAROSCOPIC ventral wall hernia repair LAPAROSCOPIC LYSIS OF ADHESIONS laparoscopic exploration of abdomen ;  Surgeon: Adin Hector, MD;  Location: [facility];  Service: General;  Laterality: N/A;  . Insertion of mesh N/A 02/04/2013    Procedure: INSERTION OF MESH;  Surgeon: Adin Hector, MD;  Location: [facility];  Service: General;  Laterality: N/A;   Family History  Problem Relation Age of Onset  . Diabetes Father   . Kidney disease Father   . Other Neg Hx   . Diabetes Maternal Grandmother   .  Hyperlipidemia Paternal Grandmother   . Stroke Paternal Grandmother    History  Substance Use Topics  . Smoking status: Never Smoker   . Smokeless tobacco: Never Used  . Alcohol Use: No   OB History   Grav Para Term Preterm Abortions TAB SAB Ect Mult Living   1 1 0 1 0 0 0 0 0 1      Review of Systems  Neurological: Positive for dizziness.  All other systems reviewed and are negative.     Allergies  Contrast media; Iohexol; Midazolam hcl; Shellfish allergy; Avandia; Geodon; Kiwi extract; Metformin and related; and Other  Home Medications   Prior to Admission medications   Medication Sig Start Date End Date Taking? Authorizing Provider   Amlodipine-Valsartan-HCTZ 10-320-25 MG TABS Take 1 tablet by mouth every evening.  01/09/13  Yes Historical Provider, MD  aspirin 325 MG tablet Take 325 mg by mouth at bedtime.    Yes Historical Provider, MD  Biotin 5000 MCG TABS Take 1 tablet by mouth daily.   Yes Historical Provider, MD  BYSTOLIC 10 MG tablet Take 10 mg by mouth every morning.  01/05/12  Yes Historical Provider, MD  furosemide (LASIX) 40 MG tablet Take 20-40 mg by mouth daily as needed for fluid or edema.    Yes Historical Provider, MD  insulin glargine (LANTUS) 100 UNIT/ML injection Inject 10 Units into the skin 2 (two) times daily.    Yes Historical Provider, MD  insulin lispro (HUMALOG) 100 UNIT/ML injection Inject 6-12 Units into the skin 3 (three) times daily before meals.    Yes Historical Provider, MD  INVOKANA 300 MG TABS Take 300 mg by mouth every morning.  02/29/12  Yes Historical Provider, MD  Liraglutide (VICTOZA) 18 MG/3ML SOPN Inject 1.2 mLs into the skin daily.   Yes Historical Provider, MD  pregabalin (LYRICA) 75 MG capsule Take 75-150 mg by mouth 2 (two) times daily. Take 150 mg in the am, and 75 mg in the evening   Yes Historical Provider, MD  Prenatal Vit-Fe Fumarate-FA (PRENATAL MULTIVITAMIN) TABS tablet Take 1 tablet by mouth every evening.    Yes Historical Provider, MD  tiZANidine (ZANAFLEX) 4 MG tablet Take 4 mg by mouth at bedtime. 09/02/13  Yes [name], MD  traMADol (ULTRAM) 50 MG tablet Take 50 mg by mouth every morning. 09/02/13  Yes [name], MD  B-D ULTRAFINE III SHORT PEN 31G X 8 MM MISC  12/31/12   Historical Provider, MD   BP 111/62  Pulse 74  Temp(Src) 98.1 F (36.7 C) (Oral)  Resp 17  SpO2 98%  LMP 10/07/2013 Physical Exam  Nursing note and vitals reviewed. Constitutional: She is oriented to person, place, and time. She appears well-developed and well-nourished.  HENT:  Head: Normocephalic and atraumatic.  Eyes: Conjunctivae and EOM are normal. Pupils are equal,  round, and reactive to light.  Neck: Normal range of motion and phonation normal. Neck supple.  Cardiovascular: Normal rate and regular rhythm.   Pulmonary/Chest: Effort normal and breath sounds normal. She exhibits no tenderness.  Abdominal: Soft. She exhibits no distension. There is no tenderness. There is no guarding.  Musculoskeletal: Normal range of motion.  Neurological: She is alert and oriented to person, place, and time. She exhibits normal muscle tone.  No dysarthria, aphasia, or nystagmus  Skin: Skin is warm and dry.  Psychiatric: She has a normal mood and affect. Her behavior is normal. Judgment and thought content normal.    ED Course  Procedures (including critical care time)  Medications  LORazepam (ATIVAN) injection 1 mg (1 mg Intravenous Given 10/07/13 1755)  acetaminophen (TYLENOL) tablet 650 mg (650 mg Oral Given 10/07/13 1930)    Patient Vitals for the past 24 hrs:  BP Temp Temp src Pulse Resp SpO2  10/07/13 2037 - 98.1 F (36.7 C) Oral - - -  10/07/13 2030 111/62 mmHg - - 74 - 98 %  10/07/13 2011 101/58 mmHg - - 77 - 98 %  10/07/13 2000 101/58 mmHg - - 86 - 100 %  10/07/13 1931 111/73 mmHg - - 82 17 100 %  10/07/13 1930 111/73 mmHg - - 85 - 100 %  10/07/13 1915 121/65 mmHg - - 93 - 100 %  10/07/13 1905 119/73 mmHg - - 87 - 99 %  10/07/13 1703 132/70 mmHg - - - 18 100 %  10/07/13 1700 132/70 mmHg - - 76 25 99 %  10/07/13 1645 129/75 mmHg - - 77 27 100 %  10/07/13 1630 135/79 mmHg - - 69 17 100 %  10/07/13 1615 136/65 mmHg - - 75 25 100 %  10/07/13 1542 124/68 mmHg 97.9 F (36.6 C) Oral 81 18 100 %    9:22 PM Reevaluation with update and discussion. After initial assessment and treatment, an updated evaluation reveals she feels better now, no headache, dizziness, chest pain, nausea, or vomiting. She is tolerating oral fluids here in the emergency department. Findings discussed with patient and husband.Daleen Bo L   Ambulation trial- She was able to  ambulate some   Labs Review Labs Reviewed  BASIC METABOLIC PANEL - Abnormal; Notable for the following:    Potassium 3.6 (*)    Glucose, Bld 145 (*)    BUN 28 (*)    Creatinine, Ser 1.29 (*)    GFR calc non Af Amer 50 (*)    GFR calc Af Amer 57 (*)    Anion gap 16 (*)    All other components within normal limits  CBC  TROPONIN I    Imaging Review Dg Chest 2 View  10/07/2013   CLINICAL DATA:  Chest pain and shortness of breath.  EXAM: CHEST  2 VIEW  COMPARISON:  06/02/2012  FINDINGS: The heart size and mediastinal contours are within normal limits. Both lungs are clear. The visualized skeletal structures are unremarkable.  IMPRESSION: No active cardiopulmonary disease.   Electronically Signed   By: Markus Daft M.D.   On: 10/07/2013 18:07   Mr Brain Wo Contrast  10/07/2013   CLINICAL DATA:  Dizziness and double vision.  EXAM: MRI HEAD WITHOUT CONTRAST  TECHNIQUE: Multiplanar, multiecho pulse sequences of the brain and surrounding structures were obtained without intravenous contrast.  COMPARISON:  10/17/2011  FINDINGS: There is no evidence of acute infarct, intracranial hemorrhage, mass, midline shift, or extra-axial fluid collection. Ventricles and sulci are normal for age. Patchy T2 hyperintensities in the left greater than right cerebral white matter are similar to the prior study and nonspecific but compatible with mild chronic small vessel ischemic disease, advanced for age. Small, remote infarcts are again seen in the pons and left basal ganglia.  Orbits are unremarkable. Paranasal sinuses and mastoid air cells are clear. Major intracranial vascular flow voids are preserved.  IMPRESSION: 1. No acute intracranial abnormality. 2. Chronic small vessel ischemic disease and remote infarcts as above.   Electronically Signed   By: Logan Bores   On: 10/07/2013 18:56     EKG Interpretation None  MDM   Final diagnoses:  Dizziness  Chest pain, unspecified    Nonspecific symptoms  with negative EP evaluation. Doubt CVA, ACS, metabolic instability, or serious bacterial infection.  Nursing Notes Reviewed/ Care Coordinated Applicable Imaging Reviewed Interpretation of Laboratory Data incorporated into ED treatment  The patient appears reasonably screened and/or stabilized for discharge and I doubt any other medical condition or other [facility] requiring further screening, evaluation, or treatment in the ED at this time prior to discharge.  Plan: Home Medications- usual; Home Treatments- rest; return here if the recommended treatment, does not improve the symptoms; Recommended follow up- PCP check up in 1 week    Richarda Blade, MD 10/07/13 2359

## 2013-10-07 NOTE — ED Notes (Signed)
Pt denies allergy to Versed, pt states that she has had Ativan before with no adverse reactions noted.

## 2013-10-07 NOTE — ED Notes (Signed)
The chest pain is worse with inspiration and is rt sided and middle.  Dr Eulis Foster came to triage to assess the pt

## 2013-10-07 NOTE — ED Notes (Signed)
When trying to ambulate pt stated that she felt dizzy, and heavy. When pt stood to walk she was unsteady. Pt took a few steps around the room with assistance.

## 2013-10-07 NOTE — ED Provider Notes (Signed)
15:45- evaluated, at triage for complaints of chest pain and is in blurred vision. She reports onset of symptoms about 11:00 AM today. At this time, she is not a candidate for thrombolytics because of low NIH score, and time of onset of disability. She also complains of a headache. She has a history of left brain stroke causing right-sided weakness,  3 years ago. Her blurred vision, as both a fuzzy, and double vision, problem. Face is symmetric smile and eyelids close normally. There is no nystagmus. Normal strength and sensation in the arms bilaterally. There is no dysarthria, or  Aphasia. Will go directly to MR brain for CNS imaging and evaluate for cardiac disease.  Richarda Blade, MD 10/07/13 [phone]

## 2013-10-07 NOTE — ED Notes (Signed)
The pt is c/o chest pain since this am with a headacxhe and dizziness  With nausea and vomiting blurred visioin also since 0900am she woke up with these sympotoms

## 2013-10-07 NOTE — Discharge Instructions (Signed)
Try to drink more fluids. Get plenty of rest. Eat 3 meals each day. Return here, if needed, for problems.     Chest Pain (Nonspecific) It is often hard to give a specific diagnosis for the cause of chest pain. There is always a chance that your pain could be related to something serious, such as a heart attack or a blood clot in the lungs. You need to follow up with your health care provider for further evaluation. CAUSES   Heartburn.  Pneumonia or bronchitis.  Anxiety or stress.  Inflammation around your heart (pericarditis) or lung (pleuritis or pleurisy).  A blood clot in the lung.  A collapsed lung (pneumothorax). It can develop suddenly on its own (spontaneous pneumothorax) or from trauma to the chest.  Shingles infection (herpes zoster virus). The chest wall is composed of bones, muscles, and cartilage. Any of these can be the source of the pain.  The bones can be bruised by injury.  The muscles or cartilage can be strained by coughing or overwork.  The cartilage can be affected by inflammation and become sore (costochondritis). DIAGNOSIS  Lab tests or other studies may be needed to find the cause of your pain. Your health care provider may have you take a test called an ambulatory electrocardiogram (ECG). An ECG records your heartbeat patterns over a 24-hour period. You may also have other tests, such as:  Transthoracic echocardiogram (TTE). During echocardiography, sound waves are used to evaluate how blood flows through your heart.  Transesophageal echocardiogram (TEE).  Cardiac monitoring. This allows your health care provider to monitor your heart rate and rhythm in real time.  Holter monitor. This is a portable device that records your heartbeat and can help diagnose heart arrhythmias. It allows your health care provider to track your heart activity for several days, if needed.  Stress tests by exercise or by giving medicine that makes the heart beat  faster. TREATMENT   Treatment depends on what may be causing your chest pain. Treatment may include:  Acid blockers for heartburn.  Anti-inflammatory medicine.  Pain medicine for inflammatory conditions.  Antibiotics if an infection is present.  You may be advised to change lifestyle habits. This includes stopping smoking and avoiding alcohol, caffeine, and chocolate.  You may be advised to keep your head raised (elevated) when sleeping. This reduces the chance of acid going backward from your stomach into your esophagus. Most of the time, nonspecific chest pain will improve within 2-3 days with rest and mild pain medicine.  HOME CARE INSTRUCTIONS   If antibiotics were prescribed, take them as directed. Finish them even if you start to feel better.  For the next few days, avoid physical activities that bring on chest pain. Continue physical activities as directed.  Do not use any tobacco products, including cigarettes, chewing tobacco, or electronic cigarettes.  Avoid drinking alcohol.  Only take medicine as directed by your health care provider.  Follow your health care provider's suggestions for further testing if your chest pain does not go away.  Keep any follow-up appointments you made. If you do not go to an appointment, you could develop lasting (chronic) problems with pain. If there is any problem keeping an appointment, call to reschedule. SEEK MEDICAL CARE IF:   Your chest pain does not go away, even after treatment.  You have a rash with blisters on your chest.  You have a fever. SEEK IMMEDIATE MEDICAL CARE IF:   You have increased chest pain or pain that  spreads to your arm, neck, jaw, back, or abdomen.  You have shortness of breath.  You have an increasing cough, or you cough up blood.  You have severe back or abdominal pain.  You feel nauseous or vomit.  You have severe weakness.  You faint.  You have chills. This is an emergency. Do not wait to  see if the pain will go away. Get medical help at once. Call your local emergency services (911 in [place] Do not drive yourself to the hospital. MAKE SURE YOU:   Understand these instructions.  Will watch your condition.  Will get help right away if you are not doing well or get worse. Document Released: 10/09/2004 Document Revised: 01/04/2013 Document Reviewed: 08/05/2007 [facility] Patient Information 2015 [place], [place]. This information is not intended to replace advice given to you by your health care provider. Make sure you discuss any questions you have with your health care provider.  Dizziness Dizziness is a common problem. It is a feeling of unsteadiness or light-headedness. You may feel like you are about to faint. Dizziness can lead to injury if you stumble or fall. A person of any age group can suffer from dizziness, but dizziness is more common in older adults. CAUSES  Dizziness can be caused by many different things, including:  Middle ear problems.  Standing for too long.  Infections.  An allergic reaction.  Aging.  An emotional response to something, such as the sight of blood.  Side effects of medicines.  Tiredness.  Problems with circulation or blood pressure.  Excessive use of alcohol or medicines, or illegal drug use.  Breathing too fast (hyperventilation).  An irregular heart rhythm (arrhythmia).  A low red blood cell count (anemia).  Pregnancy.  Vomiting, diarrhea, fever, or other illnesses that cause body fluid loss (dehydration).  Diseases or conditions such as Parkinson's disease, high blood pressure (hypertension), diabetes, and thyroid problems.  Exposure to extreme heat. DIAGNOSIS  Your health care provider will ask about your symptoms, perform a physical exam, and perform an electrocardiogram (ECG) to record the electrical activity of your heart. Your health care provider may also perform other heart or blood tests to determine the cause  of your dizziness. These may include:  Transthoracic echocardiogram (TTE). During echocardiography, sound waves are used to evaluate how blood flows through your heart.  Transesophageal echocardiogram (TEE).  Cardiac monitoring. This allows your health care provider to monitor your heart rate and rhythm in real time.  Holter monitor. This is a portable device that records your heartbeat and can help diagnose heart arrhythmias. It allows your health care provider to track your heart activity for several days if needed.  Stress tests by exercise or by giving medicine that makes the heart beat faster. TREATMENT  Treatment of dizziness depends on the cause of your symptoms and can vary greatly. HOME CARE INSTRUCTIONS   Drink enough fluids to keep your urine clear or pale yellow. This is especially important in very hot weather. In older adults, it is also important in cold weather.  Take your medicine exactly as directed if your dizziness is caused by medicines. When taking blood pressure medicines, it is especially important to get up slowly.  Rise slowly from chairs and steady yourself until you feel okay.  In the morning, first sit up on the side of the bed. When you feel okay, stand slowly while holding onto something until you know your balance is fine.  Move your legs often if you need  to stand in one place for a long time. Tighten and relax your muscles in your legs while standing.  Have someone stay with you for 1-2 days if dizziness continues to be a problem. Do this until you feel you are well enough to stay alone. Have the person call your health care provider if he or she notices changes in you that are concerning.  Do not drive or use heavy machinery if you feel dizzy.  Do not drink alcohol. SEEK IMMEDIATE MEDICAL CARE IF:   Your dizziness or light-headedness gets worse.  You feel nauseous or vomit.  You have problems talking, walking, or using your arms, hands, or  legs.  You feel weak.  You are not thinking clearly or you have trouble forming sentences. It may take a friend or family member to notice this.  You have chest pain, abdominal pain, shortness of breath, or sweating.  Your vision changes.  You notice any bleeding.  You have side effects from medicine that seems to be getting worse rather than better. MAKE SURE YOU:   Understand these instructions.  Will watch your condition.  Will get help right away if you are not doing well or get worse. Document Released: 06/25/2000 Document Revised: 01/04/2013 Document Reviewed: 07/19/2010 [facility] Patient Information 2015 [place], [place]. This information is not intended to replace advice given to you by your health care provider. Make sure you discuss any questions you have with your health care provider.

## 2013-10-12 ENCOUNTER — Other Ambulatory Visit: Payer: Self-pay | Admitting: Physical Medicine & Rehabilitation

## 2013-10-12 DIAGNOSIS — M545 Low back pain, unspecified: Secondary | ICD-10-CM

## 2013-10-17 DIAGNOSIS — E119 Type 2 diabetes mellitus without complications: Secondary | ICD-10-CM | POA: Diagnosis not present

## 2013-10-17 DIAGNOSIS — M79671 Pain in right foot: Secondary | ICD-10-CM | POA: Diagnosis not present

## 2013-10-17 DIAGNOSIS — I10 Essential (primary) hypertension: Secondary | ICD-10-CM | POA: Diagnosis not present

## 2013-10-17 DIAGNOSIS — Z23 Encounter for immunization: Secondary | ICD-10-CM | POA: Diagnosis not present

## 2013-10-21 DIAGNOSIS — M79671 Pain in right foot: Secondary | ICD-10-CM | POA: Diagnosis not present

## 2013-10-21 DIAGNOSIS — M6701 Short Achilles tendon (acquired), right ankle: Secondary | ICD-10-CM | POA: Diagnosis not present

## 2013-11-03 ENCOUNTER — Encounter: Payer: Self-pay | Admitting: *Deleted

## 2013-11-14 ENCOUNTER — Encounter (HOSPITAL_COMMUNITY): Payer: Self-pay | Admitting: Emergency Medicine

## 2013-12-01 ENCOUNTER — Encounter: Payer: Self-pay | Admitting: Neurology

## 2014-01-15 ENCOUNTER — Other Ambulatory Visit: Payer: Self-pay | Admitting: Physical Medicine & Rehabilitation

## 2014-01-17 DIAGNOSIS — E118 Type 2 diabetes mellitus with unspecified complications: Secondary | ICD-10-CM | POA: Diagnosis not present

## 2014-01-17 DIAGNOSIS — R109 Unspecified abdominal pain: Secondary | ICD-10-CM | POA: Diagnosis not present

## 2014-01-17 DIAGNOSIS — M109 Gout, unspecified: Secondary | ICD-10-CM | POA: Diagnosis not present

## 2014-01-20 ENCOUNTER — Other Ambulatory Visit (INDEPENDENT_AMBULATORY_CARE_PROVIDER_SITE_OTHER): Payer: Self-pay | Admitting: Surgery

## 2014-01-20 DIAGNOSIS — S301XXD Contusion of abdominal wall, subsequent encounter: Secondary | ICD-10-CM

## 2014-01-20 DIAGNOSIS — T888XXS Other specified complications of surgical and medical care, not elsewhere classified, sequela: Secondary | ICD-10-CM | POA: Diagnosis not present

## 2014-01-20 DIAGNOSIS — T792XXS Traumatic secondary and recurrent hemorrhage and seroma, sequela: Secondary | ICD-10-CM | POA: Diagnosis not present

## 2014-01-20 DIAGNOSIS — R1012 Left upper quadrant pain: Secondary | ICD-10-CM | POA: Diagnosis not present

## 2014-01-20 DIAGNOSIS — G8929 Other chronic pain: Secondary | ICD-10-CM | POA: Diagnosis not present

## 2014-01-23 ENCOUNTER — Other Ambulatory Visit (INDEPENDENT_AMBULATORY_CARE_PROVIDER_SITE_OTHER): Payer: Self-pay | Admitting: *Deleted

## 2014-01-23 DIAGNOSIS — S301XXA Contusion of abdominal wall, initial encounter: Secondary | ICD-10-CM

## 2014-01-23 NOTE — Addendum Note (Signed)
Addended by: Adin Hector on: 01/23/2014 10:03 AM   Modules accepted: Orders

## 2014-01-25 ENCOUNTER — Other Ambulatory Visit (INDEPENDENT_AMBULATORY_CARE_PROVIDER_SITE_OTHER): Payer: Self-pay | Admitting: *Deleted

## 2014-01-25 DIAGNOSIS — S301XXA Contusion of abdominal wall, initial encounter: Secondary | ICD-10-CM

## 2014-01-30 ENCOUNTER — Ambulatory Visit (HOSPITAL_COMMUNITY)
Admission: RE | Admit: 2014-01-30 | Discharge: 2014-01-30 | Disposition: A | Discharge status: Home / Self Care | Payer: Federal, State, Local not specified - PPO | Source: Ambulatory Visit | Attending: Surgery | Admitting: Surgery

## 2014-01-30 ENCOUNTER — Other Ambulatory Visit: Payer: Self-pay | Admitting: Radiology

## 2014-01-30 DIAGNOSIS — X58XXXA Exposure to other specified factors, initial encounter: Secondary | ICD-10-CM | POA: Insufficient documentation

## 2014-01-30 DIAGNOSIS — S301XXA Contusion of abdominal wall, initial encounter: Secondary | ICD-10-CM | POA: Diagnosis not present

## 2014-01-30 DIAGNOSIS — N858 Other specified noninflammatory disorders of uterus: Secondary | ICD-10-CM | POA: Insufficient documentation

## 2014-02-01 ENCOUNTER — Other Ambulatory Visit: Payer: Self-pay | Admitting: Radiology

## 2014-02-02 ENCOUNTER — Ambulatory Visit (HOSPITAL_COMMUNITY)
Admission: RE | Admit: 2014-02-02 | Discharge: 2014-02-02 | Disposition: A | Discharge status: Home / Self Care | Payer: Federal, State, Local not specified - PPO | Source: Ambulatory Visit | Attending: Surgery | Admitting: Surgery

## 2014-02-02 ENCOUNTER — Encounter (HOSPITAL_COMMUNITY): Payer: Self-pay

## 2014-02-02 DIAGNOSIS — K91840 Postprocedural hemorrhage and hematoma of a digestive system organ or structure following a digestive system procedure: Secondary | ICD-10-CM | POA: Diagnosis not present

## 2014-02-02 DIAGNOSIS — F419 Anxiety disorder, unspecified: Secondary | ICD-10-CM | POA: Diagnosis not present

## 2014-02-02 DIAGNOSIS — Z7982 Long term (current) use of aspirin: Secondary | ICD-10-CM | POA: Insufficient documentation

## 2014-02-02 DIAGNOSIS — Z79899 Other long term (current) drug therapy: Secondary | ICD-10-CM | POA: Insufficient documentation

## 2014-02-02 DIAGNOSIS — Z91018 Allergy to other foods: Secondary | ICD-10-CM | POA: Diagnosis not present

## 2014-02-02 DIAGNOSIS — F410 Panic disorder [episodic paroxysmal anxiety] without agoraphobia: Secondary | ICD-10-CM | POA: Insufficient documentation

## 2014-02-02 DIAGNOSIS — G2581 Restless legs syndrome: Secondary | ICD-10-CM | POA: Insufficient documentation

## 2014-02-02 DIAGNOSIS — I10 Essential (primary) hypertension: Secondary | ICD-10-CM | POA: Insufficient documentation

## 2014-02-02 DIAGNOSIS — E119 Type 2 diabetes mellitus without complications: Secondary | ICD-10-CM | POA: Insufficient documentation

## 2014-02-02 DIAGNOSIS — S301XXD Contusion of abdominal wall, subsequent encounter: Secondary | ICD-10-CM

## 2014-02-02 DIAGNOSIS — D649 Anemia, unspecified: Secondary | ICD-10-CM | POA: Diagnosis not present

## 2014-02-02 DIAGNOSIS — Z91041 Radiographic dye allergy status: Secondary | ICD-10-CM | POA: Diagnosis not present

## 2014-02-02 DIAGNOSIS — T888XXD Other specified complications of surgical and medical care, not elsewhere classified, subsequent encounter: Secondary | ICD-10-CM | POA: Diagnosis present

## 2014-02-02 DIAGNOSIS — I69351 Hemiplegia and hemiparesis following cerebral infarction affecting right dominant side: Secondary | ICD-10-CM | POA: Insufficient documentation

## 2014-02-02 DIAGNOSIS — Z794 Long term (current) use of insulin: Secondary | ICD-10-CM | POA: Insufficient documentation

## 2014-02-02 DIAGNOSIS — Z888 Allergy status to other drugs, medicaments and biological substances status: Secondary | ICD-10-CM | POA: Diagnosis not present

## 2014-02-02 DIAGNOSIS — M7981 Nontraumatic hematoma of soft tissue: Secondary | ICD-10-CM | POA: Diagnosis not present

## 2014-02-02 DIAGNOSIS — F329 Major depressive disorder, single episode, unspecified: Secondary | ICD-10-CM | POA: Diagnosis not present

## 2014-02-02 DIAGNOSIS — Z91013 Allergy to seafood: Secondary | ICD-10-CM | POA: Insufficient documentation

## 2014-02-02 DIAGNOSIS — Y838 Other surgical procedures as the cause of abnormal reaction of the patient, or of later complication, without mention of misadventure at the time of the procedure: Secondary | ICD-10-CM | POA: Insufficient documentation

## 2014-02-02 LAB — CBC
HCT: 35.2 % — ABNORMAL LOW (ref 36.0–46.0)
Hemoglobin: 11.9 g/dL — ABNORMAL LOW (ref 12.0–15.0)
MCH: 26.7 pg (ref 26.0–34.0)
MCHC: 33.8 g/dL (ref 30.0–36.0)
MCV: 78.9 fL (ref 78.0–100.0)
Platelets: 298 10*3/uL (ref 150–400)
RBC: 4.46 MIL/uL (ref 3.87–5.11)
RDW: 13.8 % (ref 11.5–15.5)
WBC: 6.2 10*3/uL (ref 4.0–10.5)

## 2014-02-02 LAB — GLUCOSE, CAPILLARY: GLUCOSE-CAPILLARY: 165 mg/dL — AB (ref 70–99)

## 2014-02-02 LAB — PREGNANCY, URINE: Preg Test, Ur: NEGATIVE

## 2014-02-02 LAB — PROTIME-INR
INR: 0.93 (ref 0.00–1.49)
Prothrombin Time: 12.6 seconds (ref 11.6–15.2)

## 2014-02-02 LAB — APTT: aPTT: 38 seconds — ABNORMAL HIGH (ref 24–37)

## 2014-02-02 MED ORDER — SODIUM CHLORIDE 0.9 % IV SOLN
Freq: Once | INTRAVENOUS | Status: AC
  Administered 2014-02-02 (×2): via INTRAVENOUS

## 2014-02-02 MED ORDER — FENTANYL CITRATE 0.05 MG/ML IJ SOLN
INTRAMUSCULAR | Status: AC
  Filled 2014-02-02: qty 4

## 2014-02-02 MED ORDER — FENTANYL CITRATE 0.05 MG/ML IJ SOLN
INTRAMUSCULAR | Status: AC | PRN
  Administered 2014-02-02: 100 ug via INTRAVENOUS
  Administered 2014-02-02 (×2): 50 ug via INTRAVENOUS

## 2014-02-02 MED ORDER — LIDOCAINE HCL 1 % IJ SOLN
INTRAMUSCULAR | Status: AC
  Filled 2014-02-02: qty 20

## 2014-02-02 NOTE — Procedures (Signed)
RLQ seroma drain 12 Fr Serous fluid 350 cc No comp

## 2014-02-02 NOTE — H&P (Signed)
Chief Complaint: abd wall seroma  Referring Physician(s): Gross,Steven  History of Present Illness: Tina [name] is a 44 y.o. female   Ventral hernia repair with mesh 09/2011 Incisional repair and new mesh 01/2013 Aspiration of abdominal seroma 04/2013 re accumulation by 07/2013 Dr Johney [place] re checked pt periodically Seemed not to be enlarging or painful By end of yr pt noticed new pain and swelling CT 01/30/2014 reveals enlarging seroma Now scheduled for aspiration/drain placement To follow up with Dr Johney [place] in 2 weeks  Past Medical History  Diagnosis Date  . Spastic hemiplegia affecting dominant side   . Panic disorder without agoraphobia   . Depression   . Anxiety   . Diabetes mellitus   . Hypertension   . Blood transfusion     IN 2012  AFTER C -SECTION  . Cerebral thrombosis with cerebral infarction JUNE 2011    RIGHT SIDED WEAKNESS ( ARM AND LEG ) AND SPASMS  . Stroke   . Right rotator cuff tear     PAIN IN RIGHT SHOULDER  . Ventral hernia     RIGHT LOWER QUADRANT-CAUSING SOME PAIN  . Anemia     DURING MENSES--HAS HEAVY BLEEDING WITH PERODS  . Headache(784.0)     MIGRAINES--NOT REALLY HEADACHE-MORE LIKE PRESSURE SENSATION IN HEAD-FEELS DIZZIY AND  FAINT AS THE PRESSURE RESOLVES  . Diabetic neuropathy     BOTH FEET --COMES AND GOES  . Restless leg syndrome     DIAGNOSED BY SLEEP STUDY - PT TOLD SHE DID NOT HAVE SLEEP APNEA  . Rash     HANDS, ARMS --STATES HX OF RASH EVER SINCE CHILDBIRTH/PREGNANCY.  STATES THE RASH OFTEN OCCURS WHEN SHE IS REALLY STRESSED.  Marland Kitchen Hernia, incisional, RLQ, s/p lap repair Sep 2013 09/02/2011  . SBO (small bowel obstruction) 06/03/2012  . Back pain, chronic   . Leg pain, right   . Hx of migraines 10/19/2011    Past Surgical History  Procedure Laterality Date  . Cesarean section  2012  . Uterine fibroid surgery      2 SURGERIES FOR FIBROIDS  . Ureter revision    . Ventral hernia repair  10/03/2011    Procedure: LAPAROSCOPIC  VENTRAL HERNIA;  Surgeon: Adin Hector, MD;  Location: [facility];  Service: General;  Laterality: N/A;  . Hernia repair  10/03/11    ventral hernia repair  . Esophagogastroduodenoscopy N/A 06/03/2012    Procedure: ESOPHAGOGASTRODUODENOSCOPY (EGD);  Surgeon: [name] Craver, MD;  Location: [facility];  Service: Endoscopy;  Laterality: N/A;  . Diagnostic laparoscopy    . Umbilical hernia repair N/A 02/04/2013    Procedure: LAPAROSCOPIC ventral wall hernia repair LAPAROSCOPIC LYSIS OF ADHESIONS laparoscopic exploration of abdomen ;  Surgeon: Adin Hector, MD;  Location: [facility];  Service: General;  Laterality: N/A;  . Insertion of mesh N/A 02/04/2013    Procedure: INSERTION OF MESH;  Surgeon: Adin Hector, MD;  Location: [facility];  Service: General;  Laterality: N/A;    Allergies: Contrast media; Iohexol; Midazolam hcl; Shellfish allergy; Avandia; Geodon; Kiwi extract; Latex; Metformin and related; and Other  Medications: Prior to Admission medications   Medication Sig Start Date End Date Taking? Authorizing Provider  Amlodipine-Valsartan-HCTZ 10-320-25 MG TABS Take 1 tablet by mouth every evening.  01/09/13  Yes Historical Provider, MD  Biotin 5000 MCG TABS Take 1 tablet by mouth daily.   Yes Historical Provider, MD  BYSTOLIC 10 MG tablet Take 10 mg by mouth every morning.  01/05/12  Yes Historical Provider, MD  doxycycline (VIBRA-TABS) 100 MG tablet Take 100 mg by mouth 2 (two) times daily.   Yes Historical Provider, MD  furosemide (LASIX) 40 MG tablet Take 20-40 mg by mouth daily as needed for fluid or edema.    Yes Historical Provider, MD  HYDROcodone-acetaminophen (NORCO/VICODIN) 5-325 MG per tablet Take 1 tablet by mouth every 4 (four) hours as needed for moderate pain.  11/22/13  Yes Historical Provider, MD  insulin glargine (LANTUS) 100 UNIT/ML injection Inject 10-15 Units into the skin 2 (two) times daily. 15 units in the morning and 10 mg in the evening   Yes Historical Provider, MD    Liraglutide (VICTOZA) 18 MG/3ML SOPN Inject 1.2 mLs into the skin daily.   Yes Historical Provider, MD  NOVOLOG FLEXPEN 100 UNIT/ML FlexPen Inject 5-12 Units into the skin 3 (three) times daily with meals.  12/14/13  Yes Historical Provider, MD  pregabalin (LYRICA) 75 MG capsule Take 75-150 mg by mouth 2 (two) times daily. Take 150 mg in the am, and 75 mg in the evening   Yes Historical Provider, MD  Prenatal Vit-Fe Fumarate-FA (PRENATAL MULTIVITAMIN) TABS tablet Take 1 tablet by mouth every evening.    Yes Historical Provider, MD  tiZANidine (ZANAFLEX) 4 MG tablet Take 4 mg by mouth at bedtime. 09/02/13  Yes [name], MD  traMADol (ULTRAM) 50 MG tablet TAKE 1 TABLET BY MOUTH TWICE A DAY 10/12/13  Yes [name], MD  aspirin 325 MG tablet Take 325 mg by mouth at bedtime.     Historical Provider, MD  B-D ULTRAFINE III SHORT PEN 31G X 8 MM MISC  12/31/12   Historical Provider, MD  tiZANidine (ZANAFLEX) 4 MG tablet TAKE 1 TABLET (4 MG TOTAL) BY MOUTH 2 (TWO) TIMES DAILY. Patient not taking: Reported on 02/02/2014 01/16/14   [name], MD    Family History  Problem Relation Age of Onset  . Diabetes Father   . Kidney disease Father   . Other Neg Hx   . Diabetes Maternal Grandmother   . Hyperlipidemia Paternal Grandmother   . Stroke Paternal Grandmother     History   Social History  . Marital Status: Married    Spouse Name: N/A    Number of Children: 2  . Years of Education: BA degree   Occupational History  .     Social History Main Topics  . Smoking status: Never Smoker   . Smokeless tobacco: Never Used  . Alcohol Use: No  . Drug Use: No  . Sexual Activity: Yes    Birth Control/ Protection: None   Other Topics Concern  . None   Social History Narrative   Lives with her spouse, sister, mother-in-law, and 2 sone. BA degree. Married 16 yrs. Drinks 2 cups of coffee a month. No tobacco use. Quit alcohol use 2008. No illicit drug use. Right handed.       Review of Systems: A 12 point ROS discussed and pertinent positives are indicated in the HPI above.  All other systems are negative.  Review of Systems  Constitutional: Negative for fever and appetite change.  Respiratory: Negative for cough and shortness of breath.   Gastrointestinal: Positive for abdominal pain and abdominal distention.  Musculoskeletal: Negative for back pain.  Psychiatric/Behavioral: Negative for behavioral problems and confusion.    Vital Signs: BP 148/77 mmHg  Pulse 85  Temp(Src) 97.7 F (36.5 C) (Oral)  Resp 18  Ht 5' 6.5" (1.689 m)  Wt 108.41 kg (239 lb)  BMI 38.00 kg/m2  SpO2 100%  LMP 01/30/2014  Physical Exam  Constitutional: She is oriented to person, place, and time.  Cardiovascular: Normal rate and regular rhythm.   No murmur heard. Pulmonary/Chest: Effort normal and breath sounds normal. She has no wheezes.  Abdominal: Soft. Bowel sounds are normal. There is tenderness.  Musculoskeletal: Normal range of motion.  Neurological: She is alert and oriented to person, place, and time.  Skin: Skin is warm and dry.  Psychiatric: She has a normal mood and affect. Her behavior is normal. Judgment and thought content normal.  Nursing note and vitals reviewed.   Imaging: Ct Abdomen Pelvis Wo Contrast  01/30/2014   CLINICAL DATA:  Abdominal wall seroma  EXAM: CT ABDOMEN AND PELVIS WITHOUT CONTRAST  TECHNIQUE: Multidetector CT imaging of the abdomen and pelvis was performed following the standard protocol without IV contrast.  COMPARISON:  03/22/2013  FINDINGS: There is a 10.4 x 5.6 cm fluid collection in the subcutaneous fat of the right lower quadrant which kidney knee case directly through the abdominal wall into a anterior right lower quadrant intraperitoneal fluid collection which measures 3.1 x 8.3 cm. The abdominal wall defect is 2.4 cm in diameter. Hernia mesh is present in the lower anterior abdominal wall.  Liver, gallbladder, spleen,  pancreas, adrenal glands are within normal limits  Kidneys are lobulated likely due to chronic change and scarring.  Uterus is lobulated and enlarged with calcifications compatible with fibroids.  4.1 cm cyst in the left adnexa.  IMPRESSION: The right lower quadrant subcutaneous seroma communicates with intraperitoneal fluid through an abdominal wall defect measuring 2.4 cm.  Lobulated uterus compatible with fibroids.  4.1 cm left adnexal cyst is likely benign.   Electronically Signed   By: Maryclare Bean M.D.   On: 01/30/2014 17:20    Labs:  CBC:  Recent Labs  05/09/13 0931 10/07/13 1546 02/02/14 0923  WBC 7.7 7.1 6.2  HGB 12.1 13.2 11.9*  HCT 36.9 38.9 35.2*  PLT 318 276 298    COAGS:  Recent Labs  05/09/13 0931 02/02/14 0923  INR 0.96 0.93  APTT 41* 38*    BMP:  Recent Labs  10/07/13 1546  NA 138  K 3.6*  CL 100  CO2 22  GLUCOSE 145*  BUN 28*  CALCIUM 9.4  CREATININE 1.29*  GFRNONAA 50*  GFRAA 57*    LIVER FUNCTION TESTS: No results for input(s): BILITOT, AST, ALT, ALKPHOS, PROT, ALBUMIN in the last 8760 hours.  TUMOR MARKERS: No results for input(s): AFPTM, CEA, CA199, CHROMGRNA in the last 8760 hours.  Assessment and Plan:  Abd wall seroma Enlarging per CT Now scheduled for aspiration and drain placement Pt aware of procedure benefits and risks and agreeable to proceed Consent signed andin chart  Thank you for this interesting consult.  I greatly enjoyed meeting Tina [name] and look forward to participating in their care.     I spent a total of 20 minutes face to face in clinical consultation, greater than 50% of which was counseling/coordinating care for abd wall seroma drain  Signed: Vikrant Pryce A 02/02/2014, 10:25 AM

## 2014-02-02 NOTE — Discharge Instructions (Signed)
Bulb Drain Home Care A bulb drain consists of a thin rubber tube and a soft, round bulb that creates a gentle suction. The rubber tube is placed in the area where you had surgery. A bulb is attached to the end of the tube that is outside the body. The bulb drain removes excess fluid that normally builds up in a surgical wound after surgery. The color and amount of fluid will vary. Immediately after surgery, the fluid is bright red and is a little thicker than water. It may gradually change to a yellow or pink color and become more thin and water-like. When the amount decreases to about 1 or 2 tbsp in 24 hours, your health care provider will usually remove it. DAILY CARE  Keep the bulb flat (compressed) at all times, except while emptying it. The flatness creates suction. You can flatten the bulb by squeezing it firmly in the middle and then closing the cap.  Keep sites where the tube enters the skin dry and covered with a bandage (dressing).  Secure the tube 1-2 in (2.5-5.1 cm) below the insertion sites to keep it from pulling on your stitches. The tube is stitched in place and will not slip out.  Secure the bulb as directed by your health care provider.  For the first 3 days after surgery, there usually is more fluid in the bulb. Empty the bulb whenever it becomes half full because the bulb does not create enough suction if it is too full. The bulb could also overflow. Write down how much fluid you remove each time you empty your drain. Add up the amount removed in 24 hours.  Empty the bulb at the same time every day once the amount of fluid decreases and you only need to empty it once a day. Write down the amounts and the 24-hour totals to give to your health care provider. This helps your health care provider know when the tubes can be removed. EMPTYING THE BULB DRAIN Before emptying the bulb, get a measuring cup, a piece of paper and a pen, and wash your hands.  Gently run your fingers down the  tube (stripping) to empty any drainage from the tubing into the bulb. This may need to be done several times a day to clear the tubing of clots and tissue.  Open the bulb cap to release suction, which causes it to inflate. Do not touch the inside of the cap.  Gently run your fingers down the tube (stripping) to empty any drainage from the tubing into the bulb.  Hold the cap out of the way, and pour fluid into the measuring cup.   Squeeze the bulb to provide suction.  Replace the cap.   Check the tape that holds the tube to your skin. If it is becoming loose, you can remove the loose piece of tape and apply a new one. Then, pin the bulb to your shirt.   Write down the amount of fluid you emptied out. Write down the date and each time you emptied your bulb drain. (If there are 2 bulbs, note the amount of drainage from each bulb and keep the totals separate. Your health care provider will want to know the total amounts for each drain and which tube is draining more.)   Flush the fluid down the toilet and wash your hands.   Call your health care provider once you have less than 2 tbsp of fluid collecting in the bulb drain every 24 hours. If  there is drainage around the tube site, change dressings and keep the area dry. Cleanse around tube with sterile saline and place dry gauze around site. This gauze should be changed when it is soiled. If it stays clean and unsoiled, it should still be changed daily.  SEEK MEDICAL CARE IF:  Your drainage has a bad smell or is cloudy.   You have a fever.   Your drainage is increasing instead of decreasing.   Your tube fell out.   You have redness or swelling around the tube site.   You have drainage from a surgical wound.   Your bulb drain will not stay flat after you empty it.  MAKE SURE YOU:   Understand these instructions.  Will watch your condition.  Will get help right away if you are not doing well or get worse. Document  Released: 12/28/1999 Document Revised: 05/16/2013 Document Reviewed: 06/04/2011 [facility] Patient Information 2015 [place], [place]. This information is not intended to replace advice given to you by your health care provider. Make sure you discuss any questions you have with your health care provider.

## 2014-02-05 LAB — BODY FLUID CULTURE: Culture: NO GROWTH

## 2014-02-06 ENCOUNTER — Other Ambulatory Visit (INDEPENDENT_AMBULATORY_CARE_PROVIDER_SITE_OTHER): Payer: Self-pay | Admitting: Surgery

## 2014-02-06 ENCOUNTER — Other Ambulatory Visit (INDEPENDENT_AMBULATORY_CARE_PROVIDER_SITE_OTHER): Payer: Self-pay | Admitting: *Deleted

## 2014-02-06 DIAGNOSIS — T888XXD Other specified complications of surgical and medical care, not elsewhere classified, subsequent encounter: Secondary | ICD-10-CM

## 2014-02-06 DIAGNOSIS — T792XXD Traumatic secondary and recurrent hemorrhage and seroma, subsequent encounter: Secondary | ICD-10-CM

## 2014-02-07 ENCOUNTER — Other Ambulatory Visit (HOSPITAL_COMMUNITY): Payer: Self-pay | Admitting: Interventional Radiology

## 2014-02-07 DIAGNOSIS — L0291 Cutaneous abscess, unspecified: Secondary | ICD-10-CM

## 2014-02-08 ENCOUNTER — Other Ambulatory Visit (INDEPENDENT_AMBULATORY_CARE_PROVIDER_SITE_OTHER): Payer: Self-pay | Admitting: Surgery

## 2014-02-08 ENCOUNTER — Ambulatory Visit (HOSPITAL_COMMUNITY)
Admission: RE | Admit: 2014-02-08 | Discharge: 2014-02-08 | Disposition: A | Discharge status: Home / Self Care | Payer: Federal, State, Local not specified - PPO | Source: Ambulatory Visit | Attending: Surgery | Admitting: Surgery

## 2014-02-08 DIAGNOSIS — T792XXD Traumatic secondary and recurrent hemorrhage and seroma, subsequent encounter: Secondary | ICD-10-CM

## 2014-02-08 DIAGNOSIS — T888XXD Other specified complications of surgical and medical care, not elsewhere classified, subsequent encounter: Principal | ICD-10-CM

## 2014-02-14 ENCOUNTER — Ambulatory Visit (HOSPITAL_COMMUNITY)
Admission: RE | Admit: 2014-02-14 | Discharge: 2014-02-14 | Disposition: A | Discharge status: Home / Self Care | Payer: Federal, State, Local not specified - PPO | Source: Ambulatory Visit | Attending: Interventional Radiology | Admitting: Interventional Radiology

## 2014-02-14 DIAGNOSIS — L02211 Cutaneous abscess of abdominal wall: Secondary | ICD-10-CM | POA: Insufficient documentation

## 2014-02-14 DIAGNOSIS — L0291 Cutaneous abscess, unspecified: Secondary | ICD-10-CM

## 2014-02-14 DIAGNOSIS — R1012 Left upper quadrant pain: Secondary | ICD-10-CM | POA: Diagnosis not present

## 2014-02-14 DIAGNOSIS — R188 Other ascites: Secondary | ICD-10-CM | POA: Diagnosis not present

## 2014-02-14 DIAGNOSIS — G8929 Other chronic pain: Secondary | ICD-10-CM | POA: Diagnosis not present

## 2014-02-14 NOTE — Progress Notes (Signed)
Patient ID: MIASIA CRABTREE, female   DOB: 05/05/1970, 44 y.o.   MRN: [id number] Drain Follow-up:  Patient presented for follow-up of her right lower abdominal seroma drain. CT demonstrated a large residual fluid collection around the drain. Patient reports no drainage from this catheter. Ultrasound was performed of the right lower quadrant. There continues to be a large fluid collection in the right lower abdominal soft tissues. The drain is along the periphery of the collection. A small amount of saline was injected through the catheter under ultrasound and confirmed that the catheter pocket was completely decompressed and the adjacent fluid collection is separate. As a result, the drainage catheter suture was removed and the catheter was cut and removed. Unfortunately, the suture within the pigtail catheter was retained and was not easily removed. This area was prepped with Betadine and a micropuncture catheter was advanced over the suture ends. The suture broke but both ends were able to be removed. Unclear if a small retained suture fragment was left behind.  Bandage placed over the old drain site.  Plan: The patient continues to have a sizable fluid collection in the right lower abdominal subcutaneous tissues with some extension into the intra-abdominal cavity or anterior abdominal wall. Will schedule patient for placement of a new drainage catheter, likely with ultrasound guidance.

## 2014-03-01 ENCOUNTER — Other Ambulatory Visit (INDEPENDENT_AMBULATORY_CARE_PROVIDER_SITE_OTHER): Payer: Self-pay | Admitting: Surgery

## 2014-03-01 ENCOUNTER — Ambulatory Visit: Payer: Federal, State, Local not specified - PPO | Admitting: Nurse Practitioner

## 2014-03-01 DIAGNOSIS — T792XXA Traumatic secondary and recurrent hemorrhage and seroma, initial encounter: Secondary | ICD-10-CM

## 2014-03-01 DIAGNOSIS — T888XXA Other specified complications of surgical and medical care, not elsewhere classified, initial encounter: Principal | ICD-10-CM

## 2014-03-06 ENCOUNTER — Emergency Department (HOSPITAL_COMMUNITY): Payer: Federal, State, Local not specified - PPO

## 2014-03-06 ENCOUNTER — Encounter (HOSPITAL_COMMUNITY): Payer: Self-pay | Admitting: Family Medicine

## 2014-03-06 ENCOUNTER — Ambulatory Visit: Payer: Federal, State, Local not specified - PPO | Admitting: Physical Medicine & Rehabilitation

## 2014-03-06 ENCOUNTER — Inpatient Hospital Stay (HOSPITAL_COMMUNITY)
Admission: EM | Admit: 2014-03-06 | Discharge: 2014-03-08 | DRG: 872 | Disposition: A | Discharge status: Home Health | Payer: Federal, State, Local not specified - PPO | Attending: Internal Medicine | Admitting: Internal Medicine

## 2014-03-06 ENCOUNTER — Encounter: Payer: Federal, State, Local not specified - PPO | Attending: Physical Medicine & Rehabilitation

## 2014-03-06 DIAGNOSIS — S301XXS Contusion of abdominal wall, sequela: Secondary | ICD-10-CM

## 2014-03-06 DIAGNOSIS — S301XXA Contusion of abdominal wall, initial encounter: Secondary | ICD-10-CM | POA: Insufficient documentation

## 2014-03-06 DIAGNOSIS — T888XXA Other specified complications of surgical and medical care, not elsewhere classified, initial encounter: Secondary | ICD-10-CM | POA: Diagnosis not present

## 2014-03-06 DIAGNOSIS — A419 Sepsis, unspecified organism: Secondary | ICD-10-CM | POA: Diagnosis not present

## 2014-03-06 DIAGNOSIS — T888XXD Other specified complications of surgical and medical care, not elsewhere classified, subsequent encounter: Secondary | ICD-10-CM | POA: Diagnosis not present

## 2014-03-06 DIAGNOSIS — Z794 Long term (current) use of insulin: Secondary | ICD-10-CM | POA: Diagnosis not present

## 2014-03-06 DIAGNOSIS — F419 Anxiety disorder, unspecified: Secondary | ICD-10-CM | POA: Diagnosis present

## 2014-03-06 DIAGNOSIS — T792XXA Traumatic secondary and recurrent hemorrhage and seroma, initial encounter: Secondary | ICD-10-CM | POA: Diagnosis not present

## 2014-03-06 DIAGNOSIS — T888XXS Other specified complications of surgical and medical care, not elsewhere classified, sequela: Secondary | ICD-10-CM | POA: Diagnosis not present

## 2014-03-06 DIAGNOSIS — I69351 Hemiplegia and hemiparesis following cerebral infarction affecting right dominant side: Secondary | ICD-10-CM

## 2014-03-06 DIAGNOSIS — Z8673 Personal history of transient ischemic attack (TIA), and cerebral infarction without residual deficits: Secondary | ICD-10-CM

## 2014-03-06 DIAGNOSIS — F410 Panic disorder [episodic paroxysmal anxiety] without agoraphobia: Secondary | ICD-10-CM | POA: Diagnosis present

## 2014-03-06 DIAGNOSIS — R112 Nausea with vomiting, unspecified: Secondary | ICD-10-CM | POA: Diagnosis not present

## 2014-03-06 DIAGNOSIS — E669 Obesity, unspecified: Secondary | ICD-10-CM | POA: Diagnosis present

## 2014-03-06 DIAGNOSIS — T792XXS Traumatic secondary and recurrent hemorrhage and seroma, sequela: Secondary | ICD-10-CM | POA: Diagnosis not present

## 2014-03-06 DIAGNOSIS — E1165 Type 2 diabetes mellitus with hyperglycemia: Secondary | ICD-10-CM | POA: Diagnosis not present

## 2014-03-06 DIAGNOSIS — F329 Major depressive disorder, single episode, unspecified: Secondary | ICD-10-CM | POA: Diagnosis present

## 2014-03-06 DIAGNOSIS — R197 Diarrhea, unspecified: Secondary | ICD-10-CM

## 2014-03-06 DIAGNOSIS — E1140 Type 2 diabetes mellitus with diabetic neuropathy, unspecified: Secondary | ICD-10-CM | POA: Diagnosis present

## 2014-03-06 DIAGNOSIS — R110 Nausea: Secondary | ICD-10-CM | POA: Diagnosis present

## 2014-03-06 DIAGNOSIS — Z6838 Body mass index (BMI) 38.0-38.9, adult: Secondary | ICD-10-CM | POA: Diagnosis not present

## 2014-03-06 DIAGNOSIS — Y838 Other surgical procedures as the cause of abnormal reaction of the patient, or of later complication, without mention of misadventure at the time of the procedure: Secondary | ICD-10-CM | POA: Diagnosis present

## 2014-03-06 DIAGNOSIS — I10 Essential (primary) hypertension: Secondary | ICD-10-CM | POA: Diagnosis present

## 2014-03-06 DIAGNOSIS — G8929 Other chronic pain: Secondary | ICD-10-CM | POA: Diagnosis present

## 2014-03-06 DIAGNOSIS — A084 Viral intestinal infection, unspecified: Secondary | ICD-10-CM | POA: Diagnosis present

## 2014-03-06 DIAGNOSIS — L02211 Cutaneous abscess of abdominal wall: Secondary | ICD-10-CM | POA: Diagnosis not present

## 2014-03-06 DIAGNOSIS — K432 Incisional hernia without obstruction or gangrene: Secondary | ICD-10-CM | POA: Diagnosis present

## 2014-03-06 DIAGNOSIS — R1031 Right lower quadrant pain: Secondary | ICD-10-CM

## 2014-03-06 DIAGNOSIS — IMO0002 Reserved for concepts with insufficient information to code with codable children: Secondary | ICD-10-CM

## 2014-03-06 LAB — CBC WITH DIFFERENTIAL/PLATELET
BASOS PCT: 0 % (ref 0–1)
Basophils Absolute: 0 10*3/uL (ref 0.0–0.1)
Eosinophils Absolute: 0 10*3/uL (ref 0.0–0.7)
Eosinophils Relative: 1 % (ref 0–5)
HCT: 35.4 % — ABNORMAL LOW (ref 36.0–46.0)
Hemoglobin: 11.8 g/dL — ABNORMAL LOW (ref 12.0–15.0)
LYMPHS ABS: 0.3 10*3/uL — AB (ref 0.7–4.0)
LYMPHS PCT: 4 % — AB (ref 12–46)
MCH: 26 pg (ref 26.0–34.0)
MCHC: 33.3 g/dL (ref 30.0–36.0)
MCV: 78.1 fL (ref 78.0–100.0)
Monocytes Absolute: 0.1 10*3/uL (ref 0.1–1.0)
Monocytes Relative: 2 % — ABNORMAL LOW (ref 3–12)
NEUTROS PCT: 93 % — AB (ref 43–77)
Neutro Abs: 6.8 10*3/uL (ref 1.7–7.7)
Platelets: 284 10*3/uL (ref 150–400)
RBC: 4.53 MIL/uL (ref 3.87–5.11)
RDW: 13.7 % (ref 11.5–15.5)
WBC: 7.3 10*3/uL (ref 4.0–10.5)

## 2014-03-06 LAB — URINE MICROSCOPIC-ADD ON

## 2014-03-06 LAB — I-STAT CHEM 8, ED
BUN: 16 mg/dL (ref 6–23)
CREATININE: 0.9 mg/dL (ref 0.50–1.10)
Calcium, Ion: 1.07 mmol/L — ABNORMAL LOW (ref 1.12–1.23)
Chloride: 111 mmol/L (ref 96–112)
GLUCOSE: 204 mg/dL — AB (ref 70–99)
HEMATOCRIT: 33 % — AB (ref 36.0–46.0)
HEMOGLOBIN: 11.2 g/dL — AB (ref 12.0–15.0)
Potassium: 4.1 mmol/L (ref 3.5–5.1)
Sodium: 141 mmol/L (ref 135–145)
TCO2: 15 mmol/L (ref 0–100)

## 2014-03-06 LAB — COMPREHENSIVE METABOLIC PANEL
ALK PHOS: 48 U/L (ref 39–117)
ALT: 18 U/L (ref 0–35)
ANION GAP: 11 (ref 5–15)
AST: 21 U/L (ref 0–37)
Albumin: 3.4 g/dL — ABNORMAL LOW (ref 3.5–5.2)
BILIRUBIN TOTAL: 0.6 mg/dL (ref 0.3–1.2)
BUN: 19 mg/dL (ref 6–23)
CALCIUM: 8.7 mg/dL (ref 8.4–10.5)
CO2: 20 mmol/L (ref 19–32)
Chloride: 108 mmol/L (ref 96–112)
Creatinine, Ser: 1.07 mg/dL (ref 0.50–1.10)
GFR calc Af Amer: 72 mL/min — ABNORMAL LOW (ref >90)
GFR calc non Af Amer: 62 mL/min — ABNORMAL LOW (ref >90)
Glucose, Bld: 252 mg/dL — ABNORMAL HIGH (ref 70–99)
POTASSIUM: 4.2 mmol/L (ref 3.5–5.1)
Sodium: 139 mmol/L (ref 135–145)
Total Protein: 7 g/dL (ref 6.0–8.3)

## 2014-03-06 LAB — URINALYSIS, ROUTINE W REFLEX MICROSCOPIC
BILIRUBIN URINE: NEGATIVE
Glucose, UA: NEGATIVE mg/dL
Ketones, ur: 15 mg/dL — AB
Leukocytes, UA: NEGATIVE
Nitrite: NEGATIVE
Protein, ur: NEGATIVE mg/dL
Specific Gravity, Urine: 1.017 (ref 1.005–1.030)
Urobilinogen, UA: 0.2 mg/dL (ref 0.0–1.0)
pH: 5 (ref 5.0–8.0)

## 2014-03-06 LAB — LIPASE, BLOOD: LIPASE: 37 U/L (ref 11–59)

## 2014-03-06 LAB — POC URINE PREG, ED: Preg Test, Ur: NEGATIVE

## 2014-03-06 LAB — I-STAT CG4 LACTIC ACID, ED: Lactic Acid, Venous: 2.2 mmol/L (ref 0.5–2.0)

## 2014-03-06 MED ORDER — FENTANYL CITRATE 0.05 MG/ML IJ SOLN
50.0000 ug | Freq: Once | INTRAMUSCULAR | Status: AC
  Administered 2014-03-06: 50 ug via INTRAVENOUS
  Filled 2014-03-06: qty 2

## 2014-03-06 MED ORDER — ALUM & MAG HYDROXIDE-SIMETH 200-200-20 MG/5ML PO SUSP: 30.0000 mL | Freq: Four times a day (QID) | ORAL | Status: DC | PRN

## 2014-03-06 MED ORDER — BLISTEX MEDICATED EX OINT
TOPICAL_OINTMENT | Freq: Two times a day (BID) | CUTANEOUS | Status: DC
  Filled 2014-03-06: qty 10

## 2014-03-06 MED ORDER — VANCOMYCIN HCL IN DEXTROSE 750-5 MG/150ML-% IV SOLN
750.0000 mg | Freq: Two times a day (BID) | INTRAVENOUS | Status: DC
  Filled 2014-03-06: qty 150

## 2014-03-06 MED ORDER — PROMETHAZINE HCL 25 MG/ML IJ SOLN: 6.2500 mg | INTRAMUSCULAR | Status: DC | PRN

## 2014-03-06 MED ORDER — LIRAGLUTIDE 18 MG/3ML ~~LOC~~ SOPN: 1.2000 mL | PEN_INJECTOR | Freq: Every day | SUBCUTANEOUS | Status: DC

## 2014-03-06 MED ORDER — PIPERACILLIN-TAZOBACTAM 3.375 G IVPB
3.3750 g | Freq: Three times a day (TID) | INTRAVENOUS | Status: DC
  Filled 2014-03-06 (×2): qty 50

## 2014-03-06 MED ORDER — SACCHAROMYCES BOULARDII 250 MG PO CAPS
250.0000 mg | ORAL_CAPSULE | Freq: Two times a day (BID) | ORAL | Status: DC
  Filled 2014-03-06: qty 1

## 2014-03-06 MED ORDER — PRENATAL MULTIVITAMIN CH
1.0000 | ORAL_TABLET | Freq: Every evening | ORAL | Status: DC
  Administered 2014-03-07: 1 via ORAL
  Filled 2014-03-06 (×3): qty 1

## 2014-03-06 MED ORDER — VANCOMYCIN HCL 10 G IV SOLR
2000.0000 mg | Freq: Once | INTRAVENOUS | Status: AC
  Administered 2014-03-06: 2000 mg via INTRAVENOUS
  Filled 2014-03-06: qty 2000

## 2014-03-06 MED ORDER — TIZANIDINE HCL 4 MG PO TABS
4.0000 mg | ORAL_TABLET | Freq: Three times a day (TID) | ORAL | Status: DC | PRN
  Administered 2014-03-07 (×2): 4 mg via ORAL
  Filled 2014-03-06 (×3): qty 1

## 2014-03-06 MED ORDER — BISMUTH SUBSALICYLATE 262 MG/15ML PO SUSP
30.0000 mL | Freq: Three times a day (TID) | ORAL | Status: DC | PRN
Start: 2014-03-06 — End: 2014-03-08
  Filled 2014-03-06: qty 236

## 2014-03-06 MED ORDER — ONDANSETRON HCL 4 MG PO TABS: 4.0000 mg | ORAL_TABLET | Freq: Four times a day (QID) | ORAL | Status: DC | PRN

## 2014-03-06 MED ORDER — MAGIC MOUTHWASH
15.0000 mL | Freq: Four times a day (QID) | ORAL | Status: DC | PRN
  Filled 2014-03-06: qty 15

## 2014-03-06 MED ORDER — MENTHOL 3 MG MT LOZG
1.0000 | LOZENGE | OROMUCOSAL | Status: DC | PRN
  Filled 2014-03-06: qty 9

## 2014-03-06 MED ORDER — AMLODIPINE-VALSARTAN-HCTZ 10-320-25 MG PO TABS: 1.0000 | ORAL_TABLET | Freq: Every evening | ORAL | Status: DC

## 2014-03-06 MED ORDER — SODIUM CHLORIDE 0.9 % IV BOLUS (SEPSIS)
500.0000 mL | INTRAVENOUS | Status: AC
  Administered 2014-03-06: 500 mL via INTRAVENOUS

## 2014-03-06 MED ORDER — AMLODIPINE BESYLATE 10 MG PO TABS
10.0000 mg | ORAL_TABLET | Freq: Every day | ORAL | Status: DC
  Administered 2014-03-07 (×2): 10 mg via ORAL
  Filled 2014-03-06 (×3): qty 1

## 2014-03-06 MED ORDER — LACTATED RINGERS IV BOLUS (SEPSIS): 1000.0000 mL | Freq: Three times a day (TID) | INTRAVENOUS | Status: DC | PRN

## 2014-03-06 MED ORDER — ASPIRIN 325 MG PO TABS
325.0000 mg | ORAL_TABLET | Freq: Every day | ORAL | Status: DC
  Administered 2014-03-07 (×2): 325 mg via ORAL
  Filled 2014-03-06 (×3): qty 1

## 2014-03-06 MED ORDER — CHLORHEXIDINE GLUCONATE 0.12 % MT SOLN
15.0000 mL | Freq: Two times a day (BID) | OROMUCOSAL | Status: DC
  Administered 2014-03-07: 15 mL via OROMUCOSAL
  Filled 2014-03-06 (×6): qty 15

## 2014-03-06 MED ORDER — SODIUM CHLORIDE 0.9 % IV BOLUS (SEPSIS)
1000.0000 mL | INTRAVENOUS | Status: AC
  Administered 2014-03-06 (×3): 1000 mL via INTRAVENOUS

## 2014-03-06 MED ORDER — PIPERACILLIN-TAZOBACTAM 3.375 G IVPB
3.3750 g | Freq: Three times a day (TID) | INTRAVENOUS | Status: DC
Start: 2014-03-07 — End: 2014-03-08
  Administered 2014-03-07 – 2014-03-08 (×5): 3.375 g via INTRAVENOUS
  Filled 2014-03-06 (×8): qty 50

## 2014-03-06 MED ORDER — INSULIN GLARGINE 100 UNIT/ML ~~LOC~~ SOLN: 10.0000 [IU] | Freq: Two times a day (BID) | SUBCUTANEOUS | Status: DC

## 2014-03-06 MED ORDER — HYDROCODONE-ACETAMINOPHEN 5-325 MG PO TABS
1.0000 | ORAL_TABLET | ORAL | Status: DC | PRN
Start: 2014-03-06 — End: 2014-03-07

## 2014-03-06 MED ORDER — ACETAMINOPHEN 325 MG PO TABS
325.0000 mg | ORAL_TABLET | Freq: Once | ORAL | Status: AC
  Administered 2014-03-06: 325 mg via ORAL
  Filled 2014-03-06: qty 1

## 2014-03-06 MED ORDER — HYDROCHLOROTHIAZIDE 25 MG PO TABS
25.0000 mg | ORAL_TABLET | Freq: Every day | ORAL | Status: DC
  Administered 2014-03-07: 25 mg via ORAL
  Filled 2014-03-06: qty 1

## 2014-03-06 MED ORDER — INSULIN ASPART 100 UNIT/ML ~~LOC~~ SOLN
0.0000 [IU] | Freq: Three times a day (TID) | SUBCUTANEOUS | Status: DC
  Administered 2014-03-07: 2 [IU] via SUBCUTANEOUS
  Administered 2014-03-07 – 2014-03-08 (×2): 3 [IU] via SUBCUTANEOUS

## 2014-03-06 MED ORDER — BARIUM SULFATE 2.1 % PO SUSP: 450.0000 mL | Freq: Once | ORAL | Status: DC

## 2014-03-06 MED ORDER — PIPERACILLIN-TAZOBACTAM 3.375 G IVPB 30 MIN
3.3750 g | Freq: Once | INTRAVENOUS | Status: AC
  Administered 2014-03-06: 3.375 g via INTRAVENOUS
  Filled 2014-03-06: qty 50

## 2014-03-06 MED ORDER — ACETAMINOPHEN 325 MG PO TABS: 650.0000 mg | ORAL_TABLET | Freq: Four times a day (QID) | ORAL | Status: DC | PRN

## 2014-03-06 MED ORDER — INSULIN GLARGINE 100 UNIT/ML ~~LOC~~ SOLN
15.0000 [IU] | Freq: Every day | SUBCUTANEOUS | Status: DC
  Administered 2014-03-07 – 2014-03-08 (×2): 15 [IU] via SUBCUTANEOUS
  Filled 2014-03-06 (×2): qty 0.15

## 2014-03-06 MED ORDER — PREGABALIN 75 MG PO CAPS
75.0000 mg | ORAL_CAPSULE | Freq: Two times a day (BID) | ORAL | Status: DC
  Administered 2014-03-06: 75 mg via ORAL
  Administered 2014-03-07: 150 mg via ORAL
  Filled 2014-03-06: qty 2
  Filled 2014-03-06: qty 1

## 2014-03-06 MED ORDER — ONDANSETRON HCL 4 MG/2ML IJ SOLN: 4.0000 mg | Freq: Four times a day (QID) | INTRAMUSCULAR | Status: DC | PRN

## 2014-03-06 MED ORDER — MORPHINE SULFATE 2 MG/ML IJ SOLN: 1.0000 mg | INTRAMUSCULAR | Status: DC | PRN

## 2014-03-06 MED ORDER — VANCOMYCIN HCL IN DEXTROSE 750-5 MG/150ML-% IV SOLN
750.0000 mg | Freq: Two times a day (BID) | INTRAVENOUS | Status: DC
  Administered 2014-03-07: 750 mg via INTRAVENOUS
  Filled 2014-03-06 (×2): qty 150

## 2014-03-06 MED ORDER — CETYLPYRIDINIUM CHLORIDE 0.05 % MT LIQD: 7.0000 mL | Freq: Two times a day (BID) | OROMUCOSAL | Status: DC

## 2014-03-06 MED ORDER — TRAMADOL HCL 50 MG PO TABS
50.0000 mg | ORAL_TABLET | Freq: Two times a day (BID) | ORAL | Status: DC
  Administered 2014-03-06 – 2014-03-07 (×3): 50 mg via ORAL
  Filled 2014-03-06 (×3): qty 1

## 2014-03-06 MED ORDER — ONDANSETRON 8 MG/NS 50 ML IVPB
8.0000 mg | Freq: Four times a day (QID) | INTRAVENOUS | Status: DC | PRN
Start: 2014-03-06 — End: 2014-03-06
  Filled 2014-03-06: qty 8

## 2014-03-06 MED ORDER — INSULIN GLARGINE 100 UNIT/ML ~~LOC~~ SOLN
10.0000 [IU] | Freq: Every day | SUBCUTANEOUS | Status: DC
  Administered 2014-03-07: 10 [IU] via SUBCUTANEOUS
  Filled 2014-03-06 (×2): qty 0.1

## 2014-03-06 MED ORDER — ACETAMINOPHEN 650 MG RE SUPP: 650.0000 mg | Freq: Four times a day (QID) | RECTAL | Status: DC | PRN

## 2014-03-06 MED ORDER — IRBESARTAN 300 MG PO TABS
300.0000 mg | ORAL_TABLET | Freq: Every day | ORAL | Status: DC
  Administered 2014-03-07 (×2): 300 mg via ORAL
  Filled 2014-03-06 (×3): qty 1

## 2014-03-06 MED ORDER — ONDANSETRON HCL 4 MG/2ML IJ SOLN
4.0000 mg | Freq: Four times a day (QID) | INTRAMUSCULAR | Status: DC | PRN
  Administered 2014-03-06 – 2014-03-08 (×3): 4 mg via INTRAVENOUS
  Filled 2014-03-06 (×3): qty 2

## 2014-03-06 MED ORDER — SODIUM CHLORIDE 0.9 % IV SOLN
INTRAVENOUS | Status: AC
  Administered 2014-03-07: 100 mL/h via INTRAVENOUS

## 2014-03-06 MED ORDER — PHENOL 1.4 % MT LIQD
2.0000 | OROMUCOSAL | Status: DC | PRN
  Filled 2014-03-06: qty 177

## 2014-03-06 MED ORDER — ACETAMINOPHEN 325 MG PO TABS
650.0000 mg | ORAL_TABLET | Freq: Once | ORAL | Status: AC
  Administered 2014-03-06: 650 mg via ORAL
  Filled 2014-03-06: qty 2

## 2014-03-06 MED ORDER — LIP MEDEX EX OINT
1.0000 "application " | TOPICAL_OINTMENT | Freq: Two times a day (BID) | CUTANEOUS | Status: DC
  Filled 2014-03-06: qty 7

## 2014-03-06 MED ORDER — NEBIVOLOL HCL 10 MG PO TABS
10.0000 mg | ORAL_TABLET | Freq: Every morning | ORAL | Status: DC
  Administered 2014-03-07: 10 mg via ORAL
  Filled 2014-03-06: qty 1

## 2014-03-06 NOTE — ED Provider Notes (Signed)
CSN: [id number]     Arrival date & time 03/06/14  1100 History   First MD Initiated Contact with Patient 03/06/14 1503     Chief Complaint  Patient presents with  . Flank Pain  . Diarrhea     (Consider location/radiation/quality/duration/timing/severity/associated sxs/prior Treatment) The history is provided by the patient.  44 year old with past medical history of multiple abdominal surgeries, with incisional hernia and h/o chronic seroma s/p IR-guided drainges who presents with a 2 day history of progressively worsening severe right lower quadrant abdominal pain. She describes the pain as a constant gnawing and progressively severe pain that is now 10 out of 10 in severity localized to the right lower quadrant. The pain is localized over the area of her previous seroma and she states the pain feels similar to her previous infected seromas. She endorses generalized fatigue as well as fever and chills at home over the last 24 hours. She is scheduled to have her seroma drained tomorrow with surgery but states that she "could not wait." She also endorses associated diarrhea as well as nausea and vomiting and states this is not unusual for her. Of note, she was on doxycycline 2 weeks ago while her IR-guided drain was in place, but this was now removed. No chest pain. No dysuria, hematuria, or frequency.  Past Medical History  Diagnosis Date  . Spastic hemiplegia affecting dominant side   . Panic disorder without agoraphobia   . Depression   . Anxiety   . Diabetes mellitus   . Hypertension   . Blood transfusion     IN 2012  AFTER C -SECTION  . Cerebral thrombosis with cerebral infarction JUNE 2011    RIGHT SIDED WEAKNESS ( ARM AND LEG ) AND SPASMS  . Stroke   . Right rotator cuff tear     PAIN IN RIGHT SHOULDER  . Ventral hernia     RIGHT LOWER QUADRANT-CAUSING SOME PAIN  . Anemia     DURING MENSES--HAS HEAVY BLEEDING WITH PERODS  . Headache(784.0)     MIGRAINES--NOT REALLY  HEADACHE-MORE LIKE PRESSURE SENSATION IN HEAD-FEELS DIZZIY AND  FAINT AS THE PRESSURE RESOLVES  . Diabetic neuropathy     BOTH FEET --COMES AND GOES  . Restless leg syndrome     DIAGNOSED BY SLEEP STUDY - PT TOLD SHE DID NOT HAVE SLEEP APNEA  . Rash     HANDS, ARMS --STATES HX OF RASH EVER SINCE CHILDBIRTH/PREGNANCY.  STATES THE RASH OFTEN OCCURS WHEN SHE IS REALLY STRESSED.  Marland Kitchen Hernia, incisional, RLQ, s/p lap repair Sep 2013 09/02/2011  . SBO (small bowel obstruction) 06/03/2012  . Back pain, chronic   . Leg pain, right   . Hx of migraines 10/19/2011   Past Surgical History  Procedure Laterality Date  . Cesarean section  2012  . Uterine fibroid surgery      2 SURGERIES FOR FIBROIDS  . Ureter revision    . Ventral hernia repair  10/03/2011    Procedure: LAPAROSCOPIC VENTRAL HERNIA;  Surgeon: Adin Hector, MD;  Location: [facility];  Service: General;  Laterality: N/A;  . Hernia repair  10/03/11    ventral hernia repair  . Esophagogastroduodenoscopy N/A 06/03/2012    Procedure: ESOPHAGOGASTRODUODENOSCOPY (EGD);  Surgeon: [name] Craver, MD;  Location: [facility];  Service: Endoscopy;  Laterality: N/A;  . Diagnostic laparoscopy    . Umbilical hernia repair N/A 02/04/2013    Procedure: LAPAROSCOPIC ventral wall hernia repair LAPAROSCOPIC LYSIS OF ADHESIONS laparoscopic exploration of abdomen ;  Surgeon: Adin Hector, MD;  Location: [facility];  Service: General;  Laterality: N/A;  . Insertion of mesh N/A 02/04/2013    Procedure: INSERTION OF MESH;  Surgeon: Adin Hector, MD;  Location: [facility];  Service: General;  Laterality: N/A;   Family History  Problem Relation Age of Onset  . Diabetes Father   . Kidney disease Father   . Other Neg Hx   . Diabetes Maternal Grandmother   . Hyperlipidemia Paternal Grandmother   . Stroke Paternal Grandmother    History  Substance Use Topics  . Smoking status: Never Smoker   . Smokeless tobacco: Never Used  . Alcohol Use: No   OB History    Gravida  Para Term Preterm AB TAB SAB Ectopic Multiple Living   1 1 0 1 0 0 0 0 0 1      Review of Systems  Constitutional: Positive for fever, chills and fatigue.  HENT: Negative for congestion and sore throat.   Eyes: Negative for visual disturbance.  Respiratory: Negative for cough, shortness of breath and wheezing.   Cardiovascular: Negative for chest pain and leg swelling.  Gastrointestinal: Positive for nausea, vomiting, abdominal pain and diarrhea.  Genitourinary: Positive for flank pain. Negative for dysuria, vaginal bleeding, vaginal discharge and pelvic pain.  Musculoskeletal: Negative for neck pain.  Skin: Negative for rash.  Allergic/Immunologic: Negative for immunocompromised state.  Neurological: Negative for dizziness, weakness and headaches.      Allergies  Contrast media; Iohexol; Midazolam hcl; Shellfish allergy; Avandia; Geodon; Kiwi extract; Latex; Metformin and related; and Other  Home Medications   Prior to Admission medications   Medication Sig Start Date End Date Taking? Authorizing Provider  Amlodipine-Valsartan-HCTZ 10-320-25 MG TABS Take 1 tablet by mouth every evening.  01/09/13  Yes Historical Provider, MD  aspirin 325 MG tablet Take 325 mg by mouth at bedtime.    Yes Historical Provider, MD  B-D ULTRAFINE III SHORT PEN 31G X 8 MM MISC  12/31/12  Yes Historical Provider, MD  Biotin 5000 MCG TABS Take 1 tablet by mouth daily.   Yes Historical Provider, MD  BYSTOLIC 10 MG tablet Take 10 mg by mouth every morning.  01/05/12  Yes Historical Provider, MD  furosemide (LASIX) 40 MG tablet Take 20-40 mg by mouth daily as needed for fluid or edema.    Yes Historical Provider, MD  HYDROcodone-acetaminophen (NORCO/VICODIN) 5-325 MG per tablet Take 1 tablet by mouth every 4 (four) hours as needed for moderate pain.  11/22/13  Yes Historical Provider, MD  insulin glargine (LANTUS) 100 UNIT/ML injection Inject 10-15 Units into the skin 2 (two) times daily. 15 units in the  morning and 10 mg in the evening   Yes Historical Provider, MD  Liraglutide (VICTOZA) 18 MG/3ML SOPN Inject 1.2 mLs into the skin daily.   Yes Historical Provider, MD  NOVOLOG FLEXPEN 100 UNIT/ML FlexPen Inject 5-12 Units into the skin 3 (three) times daily with meals.  12/14/13  Yes Historical Provider, MD  pregabalin (LYRICA) 75 MG capsule Take 75-150 mg by mouth 2 (two) times daily. Take 150 mg in the am, and 75 mg in the evening   Yes Historical Provider, MD  Prenatal Vit-Fe Fumarate-FA (PRENATAL MULTIVITAMIN) TABS tablet Take 1 tablet by mouth every evening.    Yes Historical Provider, MD  tiZANidine (ZANAFLEX) 4 MG tablet Take 4 mg by mouth at bedtime. 09/02/13  Yes [name], MD  tiZANidine (ZANAFLEX) 4 MG tablet TAKE 1 TABLET (4 MG  TOTAL) BY MOUTH 2 (TWO) TIMES DAILY. 01/16/14  Yes [name], MD  traMADol (ULTRAM) 50 MG tablet TAKE 1 TABLET BY MOUTH TWICE A DAY 10/12/13  Yes [name], MD  doxycycline (VIBRA-TABS) 100 MG tablet Take 100 mg by mouth 2 (two) times daily.    Historical Provider, MD   BP 121/68 mmHg  Pulse 130  Temp(Src) 102.5 F (39.2 C) (Oral)  Resp 22  SpO2 100%  LMP 02/27/2014 Physical Exam  Constitutional: She is oriented to person, place, and time. She appears well-developed and well-nourished.  Non-toxic appearance. She appears ill. She appears distressed.  HENT:  Head: Normocephalic and atraumatic.  Mouth/Throat: No oropharyngeal exudate.  Eyes: Pupils are equal, round, and reactive to light.  Neck: Neck supple. No JVD present.  Cardiovascular: Normal heart sounds.  Tachycardia present.  Exam reveals no friction rub.   No murmur heard. Pulmonary/Chest: Effort normal and breath sounds normal. No respiratory distress. She has no wheezes. She has no rales.  Abdominal: Soft. She exhibits no distension. There is tenderness in the right lower quadrant. There is no rigidity, no rebound, no guarding and negative Murphy's sign.  Musculoskeletal:  She exhibits no edema.  Neurological: She is alert and oriented to person, place, and time.  Skin: Skin is warm. No rash noted.  Nursing note and vitals reviewed.   ED Course  Procedures (including critical care time) Labs Review Labs Reviewed  CBC WITH DIFFERENTIAL/PLATELET - Abnormal; Notable for the following:    Hemoglobin 11.8 (*)    HCT 35.4 (*)    Neutrophils Relative % 93 (*)    Lymphocytes Relative 4 (*)    Lymphs Abs 0.3 (*)    Monocytes Relative 2 (*)    All other components within normal limits  COMPREHENSIVE METABOLIC PANEL - Abnormal; Notable for the following:    Glucose, Bld 252 (*)    Albumin 3.4 (*)    GFR calc non Af Amer 62 (*)    GFR calc Af Amer 72 (*)    All other components within normal limits  URINALYSIS, ROUTINE W REFLEX MICROSCOPIC - Abnormal; Notable for the following:    APPearance HAZY (*)    Hgb urine dipstick MODERATE (*)    Ketones, ur 15 (*)    All other components within normal limits  URINE MICROSCOPIC-ADD ON - Abnormal; Notable for the following:    Bacteria, UA FEW (*)    All other components within normal limits  GLUCOSE, CAPILLARY - Abnormal; Notable for the following:    Glucose-Capillary 215 (*)    All other components within normal limits  I-STAT CG4 LACTIC ACID, ED - Abnormal; Notable for the following:    Lactic Acid, Venous 2.20 (*)    All other components within normal limits  I-STAT CHEM 8, ED - Abnormal; Notable for the following:    Glucose, Bld 204 (*)    Calcium, Ion 1.07 (*)    Hemoglobin 11.2 (*)    HCT 33.0 (*)    All other components within normal limits  CULTURE, BLOOD (ROUTINE X 2)  CULTURE, BLOOD (ROUTINE X 2)  URINE CULTURE  CLOSTRIDIUM DIFFICILE BY PCR  STOOL CULTURE  LIPASE, BLOOD  COMPREHENSIVE METABOLIC PANEL  CBC WITH DIFFERENTIAL/PLATELET  POC URINE PREG, ED  I-STAT CG4 LACTIC ACID, ED  I-STAT CG4 LACTIC ACID, ED    Imaging Review Ct Abdomen Pelvis Wo Contrast  03/06/2014   CLINICAL DATA:   Right lower quadrant abdominal pain  EXAM:  CT ABDOMEN AND PELVIS WITHOUT CONTRAST  TECHNIQUE: Multidetector CT imaging of the abdomen and pelvis was performed following the standard protocol without IV contrast.  COMPARISON:  02/14/2014  FINDINGS: The fluid collection extending from the right lower quadrant peritoneal space, through the abdominal wall, and into the right lower quadrant subcutaneous fat measures 10.3 x 8.7 cm and previously measured 8.2 x 9.7 cm. It is slightly larger. The drain has been removed. It is hernia mesh throughout the anterior abdominal wall including the right lower quadrant remains present.  Patchy opacities at both lung bases likely represent atelectasis  Liver, gallbladder, spleen, pancreas, kidneys, and adrenal glands are stable.  Uterus and adnexa are stable. Left ovary remains somewhat prominent.  Free fluid in the pelvis has resolved.  IMPRESSION: Right lower quadrant fluid collection extending through the abdominal wall is slightly larger, today measuring 10.3 x 8.7 cm. The drain has been removed. Repeat drainage may be indicated.   Electronically Signed   By: Marybelle Killings M.D.   On: 03/06/2014 18:23   Dg Chest Port 1 View  03/06/2014   CLINICAL DATA:  44 year old female with flank pain and diarrhea for 2 days. Initial encounter.  EXAM: PORTABLE CHEST - 1 VIEW  COMPARISON:  10/07/2013 and earlier.  FINDINGS: Portable AP view at 1538 hours. Large body habitus. Mildly lower lung volumes. Normal cardiac size and mediastinal contours. Visualized tracheal air column is within normal limits. Allowing for portable technique, the lungs are clear. No pneumothorax or pneumoperitoneum.  IMPRESSION: No acute cardiopulmonary abnormality.   Electronically Signed   By: Genevie Ann M.D.   On: 03/06/2014 15:58     EKG Interpretation None      MDM   44 year old with past medical history of multiple abdominal surgeries, with incisional hernia and h/o chronic seroma s/p IR-guided drainges  who presents with a 2 day history of progressively worsening severe right lower quadrant abdominal pain. See history of present illness above. On arrival, temp 102.5, heart rate 120, respiratory rate 22, blood pressure 139/76, satting 98% on room air. Exam as above, the patient is overall in mild distress but is nontoxic appearing. Her abdomen is soft with right lower quadrant tenderness but no rebound, rigidity, or guarding. No evidence of peritonitis.  Patient's presentation is most concerning for possible infection of known chronic right incisional seroma. She is tender directly over the area of her prior drainage and her fever and tachycardia are suggestive of infection. We will send cultures and broad labs as well as start him. Vancomycin and Zosyn for possible broad-spectrum intra-abdominal coverage. Lactate has also been ordered. Regarding imaging, will consult and discuss with Dr. Clyda Greener team. Differential includes UTI, pyelonephritis, nephrolithiasis, though this is less likely in the absence of any urinary symptoms. The patient denies any vaginal bleeding, vaginal discharge, and I do not suspect PID or uterine etiology. We'll follow-up labs and reassess.  Labs reviewed as above. CBC with no leukocytosis and baseline anemia. CMP with no evidence of acute hepatitis or acute kidney injury. Lipase is normal. Urinalysis with mild hematuria but no evidence of infection. Per discussion with surgery team, will obtain CT abdomen and pelvis with by mouth contrast. Of note, the patient cannot receive IV contrast. Heart rate is improving to 110s with IV fluids and patient remains well-appearing with stable blood pressures. Lactate only minimally elevated and <3, will repeat after IVF.  CXR clear. CT scan shows RLQ fluid collection concerning for abscess. Discussed with Surgery, who advised medicine  admission for IV ABX, and they will evaluate in the morning. Discussed with medicine and will admit to monitored  bed. VSS. Patient states she feels significantly better.  Clinical Impression: 1. Sepsis due to undetermined organism   2. Abdominal wall seroma, sequela   3. Nausea vomiting and diarrhea     Disposition: Admit  Condition: Stable  Pt seen in conjunction with Dr. Marlynn Perking, MD 03/07/14 0737  Dot Lanes, MD 03/07/14 [phone]

## 2014-03-06 NOTE — H&P (Signed)
[facility] History and Physical  Tina [name] HUD:[id number] DOB: 1971/01/05 DOA: 03/06/2014  Referring physician: ER physician. PCP: Dwan Bolt, MD  Chief Complaint: Nausea vomiting diarrhea and right lower quadrant pain.  HPI: Tina [name] is a 44 y.o. female with history of diabetes mellitus type 2, hypertension, history of stroke, ventral hernia complicated by seroma who has had drainage done 2 weeks ago and was ultimately plan to get another drain done due to the collection of fluid comes to the ER because of worsening right lower quadrant pain with nausea vomiting and diarrhea. Patient has been having worsening abdominal pain over the right lower quadrant where patient has seroma over the last 1 week. Patient has been having nausea vomiting diarrhea over the last 2 days and patient states patient has had sick contacts with her kids who has been having nausea and vomiting. Patient also was recently on antibiotics. In the ER patient was on to be febrile and tachycardic and on-call surgery was consulted due to persistent seroma collection and patient admitted for further management.   Review of Systems: As presented in the history of presenting illness, rest negative.  Past Medical History  Diagnosis Date  . Spastic hemiplegia affecting dominant side   . Panic disorder without agoraphobia   . Depression   . Anxiety   . Diabetes mellitus   . Hypertension   . Blood transfusion     IN 2012  AFTER C -SECTION  . Cerebral thrombosis with cerebral infarction JUNE 2011    RIGHT SIDED WEAKNESS ( ARM AND LEG ) AND SPASMS  . Stroke   . Right rotator cuff tear     PAIN IN RIGHT SHOULDER  . Ventral hernia     RIGHT LOWER QUADRANT-CAUSING SOME PAIN  . Anemia     DURING MENSES--HAS HEAVY BLEEDING WITH PERODS  . Headache(784.0)     MIGRAINES--NOT REALLY HEADACHE-MORE LIKE PRESSURE SENSATION IN HEAD-FEELS DIZZIY AND  FAINT AS THE PRESSURE RESOLVES  . Diabetic  neuropathy     BOTH FEET --COMES AND GOES  . Restless leg syndrome     DIAGNOSED BY SLEEP STUDY - PT TOLD SHE DID NOT HAVE SLEEP APNEA  . Rash     HANDS, ARMS --STATES HX OF RASH EVER SINCE CHILDBIRTH/PREGNANCY.  STATES THE RASH OFTEN OCCURS WHEN SHE IS REALLY STRESSED.  Marland Kitchen Hernia, incisional, RLQ, s/p lap repair Sep 2013 09/02/2011  . SBO (small bowel obstruction) 06/03/2012  . Back pain, chronic   . Leg pain, right   . Hx of migraines 10/19/2011   Past Surgical History  Procedure Laterality Date  . Cesarean section  2012  . Uterine fibroid surgery      2 SURGERIES FOR FIBROIDS  . Ureter revision    . Ventral hernia repair  10/03/2011    Procedure: LAPAROSCOPIC VENTRAL HERNIA;  Surgeon: Adin Hector, MD;  Location: [facility];  Service: General;  Laterality: N/A;  . Hernia repair  10/03/11    ventral hernia repair  . Esophagogastroduodenoscopy N/A 06/03/2012    Procedure: ESOPHAGOGASTRODUODENOSCOPY (EGD);  Surgeon: [name] Craver, MD;  Location: [facility] ENDOSCOPY;  Service: Endoscopy;  Laterality: N/A;  . Diagnostic laparoscopy    . Umbilical hernia repair N/A 02/04/2013    Procedure: LAPAROSCOPIC ventral wall hernia repair LAPAROSCOPIC LYSIS OF ADHESIONS laparoscopic exploration of abdomen ;  Surgeon: Adin Hector, MD;  Location: [facility];  Service: General;  Laterality: N/A;  . Insertion of mesh N/A 02/04/2013    Procedure:  INSERTION OF MESH;  Surgeon: Adin Hector, MD;  Location: [facility];  Service: General;  Laterality: N/A;   Social History:  reports that she has never smoked. She has never used smokeless tobacco. She reports that she does not drink alcohol or use illicit drugs. Where does patient live home. Can patient participate in ADLs? Yes.  Allergies  Allergen Reactions  . Contrast Media [Iodinated Diagnostic Agents] Other (See Comments)    Difficulty breathing  . Iohexol Hives, Nausea And Vomiting and Swelling     Desc: Magnevist-gadolinium-difficulty breathing, throat swelling    . Midazolam Hcl Anaphylaxis    Difficulty breathing  . Shellfish Allergy Anaphylaxis  . Avandia [Rosiglitazone Maleate]     Patient doesn't remember reaction  . Geodon [Ziprasidone]   . Kiwi Extract Itching and Swelling  . Latex Itching  . Metformin And Related Nausea And Vomiting and Other (See Comments)    diarrhea  . Other Itching    Patient is allergic to all nuts except peanuts.     Family History:  Family History  Problem Relation Age of Onset  . Diabetes Father   . Kidney disease Father   . Other Neg Hx   . Diabetes Maternal Grandmother   . Hyperlipidemia Paternal Grandmother   . Stroke Paternal Grandmother       Prior to Admission medications   Medication Sig Start Date End Date Taking? Authorizing Provider  Amlodipine-Valsartan-HCTZ 10-320-25 MG TABS Take 1 tablet by mouth every evening.  01/09/13  Yes Historical Provider, MD  aspirin 325 MG tablet Take 325 mg by mouth at bedtime.    Yes Historical Provider, MD  B-D ULTRAFINE III SHORT PEN 31G X 8 MM MISC  12/31/12  Yes Historical Provider, MD  Biotin 5000 MCG TABS Take 1 tablet by mouth daily.   Yes Historical Provider, MD  BYSTOLIC 10 MG tablet Take 10 mg by mouth every morning.  01/05/12  Yes Historical Provider, MD  furosemide (LASIX) 40 MG tablet Take 20-40 mg by mouth daily as needed for fluid or edema.    Yes Historical Provider, MD  HYDROcodone-acetaminophen (NORCO/VICODIN) 5-325 MG per tablet Take 1 tablet by mouth every 4 (four) hours as needed for moderate pain.  11/22/13  Yes Historical Provider, MD  insulin glargine (LANTUS) 100 UNIT/ML injection Inject 10-15 Units into the skin 2 (two) times daily. 15 units in the morning and 10 mg in the evening   Yes Historical Provider, MD  Liraglutide (VICTOZA) 18 MG/3ML SOPN Inject 1.2 mLs into the skin daily.   Yes Historical Provider, MD  NOVOLOG FLEXPEN 100 UNIT/ML FlexPen Inject 5-12 Units into the skin 3 (three) times daily with meals.  12/14/13  Yes Historical  Provider, MD  pregabalin (LYRICA) 75 MG capsule Take 75-150 mg by mouth 2 (two) times daily. Take 150 mg in the am, and 75 mg in the evening   Yes Historical Provider, MD  Prenatal Vit-Fe Fumarate-FA (PRENATAL MULTIVITAMIN) TABS tablet Take 1 tablet by mouth every evening.    Yes Historical Provider, MD  tiZANidine (ZANAFLEX) 4 MG tablet Take 4 mg by mouth at bedtime. 09/02/13  Yes [name], MD  tiZANidine (ZANAFLEX) 4 MG tablet TAKE 1 TABLET (4 MG TOTAL) BY MOUTH 2 (TWO) TIMES DAILY. 01/16/14  Yes [name], MD  traMADol (ULTRAM) 50 MG tablet TAKE 1 TABLET BY MOUTH TWICE A DAY 10/12/13  Yes [name], MD  doxycycline (VIBRA-TABS) 100 MG tablet Take 100 mg by mouth  2 (two) times daily.    Historical Provider, MD    Physical Exam: Filed Vitals:   03/06/14 1900 03/06/14 1915 03/06/14 1924 03/06/14 2009  BP: 120/59 124/74  131/64  Pulse: 118 117  116  Temp:   102.7 F (39.3 C) 100.6 F (38.1 C)  TempSrc:   Oral Oral  Resp: 20   18  Height:    5\' 6"  (1.676 m)  Weight:    108.3 kg (238 lb 12.1 oz)  SpO2: 99% 98%  96%     General:  Moderately built and nourished.  Eyes: Anicteric no pallor.  ENT: No discharge from the ears eyes nose and mouth.  Neck: No mass felt.  Cardiovascular: S1-S2 heard.  Respiratory: No rhonchi or crepitations.  Abdomen: Right lower quadrant tense swelling with mild tenderness.  Skin: No rash.  Musculoskeletal: No edema.  Psychiatric: Appears normal.  Neurologic: Alert awake oriented to time place and person. Moves all extremities.  Labs on Admission:  Basic Metabolic Panel:  Recent Labs Lab 03/06/14 1112 03/06/14 1955  NA 139 141  K 4.2 4.1  CL 108 111  CO2 20  --   GLUCOSE 252* 204*  BUN 19 16  CREATININE 1.07 0.90  CALCIUM 8.7  --    Liver Function Tests:  Recent Labs Lab 03/06/14 1112  AST 21  ALT 18  ALKPHOS 48  BILITOT 0.6  PROT 7.0  ALBUMIN 3.4*    Recent Labs Lab 03/06/14 1112  LIPASE 37    No results for input(s): AMMONIA in the last 168 hours. CBC:  Recent Labs Lab 03/06/14 1112 03/06/14 1955  WBC 7.3  --   NEUTROABS 6.8  --   HGB 11.8* 11.2*  HCT 35.4* 33.0*  MCV 78.1  --   PLT 284  --    Cardiac Enzymes: No results for input(s): CKTOTAL, CKMB, CKMBINDEX, TROPONINI in the last 168 hours.  BNP (last 3 results) No results for input(s): BNP in the last 8760 hours.  ProBNP (last 3 results) No results for input(s): PROBNP in the last 8760 hours.  CBG: No results for input(s): GLUCAP in the last 168 hours.  Radiological Exams on Admission: Ct Abdomen Pelvis Wo Contrast  03/06/2014   CLINICAL DATA:  Right lower quadrant abdominal pain  EXAM: CT ABDOMEN AND PELVIS WITHOUT CONTRAST  TECHNIQUE: Multidetector CT imaging of the abdomen and pelvis was performed following the standard protocol without IV contrast.  COMPARISON:  02/14/2014  FINDINGS: The fluid collection extending from the right lower quadrant peritoneal space, through the abdominal wall, and into the right lower quadrant subcutaneous fat measures 10.3 x 8.7 cm and previously measured 8.2 x 9.7 cm. It is slightly larger. The drain has been removed. It is hernia mesh throughout the anterior abdominal wall including the right lower quadrant remains present.  Patchy opacities at both lung bases likely represent atelectasis  Liver, gallbladder, spleen, pancreas, kidneys, and adrenal glands are stable.  Uterus and adnexa are stable. Left ovary remains somewhat prominent.  Free fluid in the pelvis has resolved.  IMPRESSION: Right lower quadrant fluid collection extending through the abdominal wall is slightly larger, today measuring 10.3 x 8.7 cm. The drain has been removed. Repeat drainage may be indicated.   Electronically Signed   By: Marybelle Killings M.D.   On: 03/06/2014 18:23   Dg Chest Port 1 View  03/06/2014   CLINICAL DATA:  44 year old female with flank pain and diarrhea for 2 days. Initial encounter.  EXAM:  PORTABLE CHEST - 1 VIEW  COMPARISON:  10/07/2013 and earlier.  FINDINGS: Portable AP view at 1538 hours. Large body habitus. Mildly lower lung volumes. Normal cardiac size and mediastinal contours. Visualized tracheal air column is within normal limits. Allowing for portable technique, the lungs are clear. No pneumothorax or pneumoperitoneum.  IMPRESSION: No acute cardiopulmonary abnormality.   Electronically Signed   By: Genevie Ann M.D.   On: 03/06/2014 15:58     Assessment/Plan Active Problems:   Diabetes mellitus type II, uncontrolled   History of CVA (cerebrovascular accident)   HTN (hypertension)   Seroma, postoperative   Sepsis due to undetermined organism   Nausea vomiting and diarrhea   Sepsis   1. Sepsis - source most likely intra-abdominal. Appreciate surgery consult and ultrasound-guided guided seroma drainage has been ordered. Follow serial fluid studies for any infection. Patient also has diarrhea nausea and vomiting for which I have ordered stool studies. Patient has been empirically started on Zosyn. Follow cultures and continue hydration. 2. Diabetes mellitus type 2 uncontrolled - continue home medications with close follow-up of her CBGs with sliding scale coverage. 3. Hypertension - continue home medication except hold diuretics since patient is getting hydration. 4. History of CVA with right-sided weakness - on aspirin.   DVT Prophylaxis SCDs for now as patient is getting seroma drained.  Code Status: Full code.  Family Communication: None.  Disposition Plan: Admit to inpatient.    Otilio Groleau N. [facility] Pager [phone].  If 7PM-7AM, please contact night-coverage [email or web address] Password [facility] 03/06/2014, 10:31 PM

## 2014-03-06 NOTE — Consult Note (Signed)
Tina [name] 1970-02-03  [id number].   Requesting MD: Dr. Leonard Schwartz Chief Complaint/Reason for Consult: fever, abdominal pain HPI: This is a 44 yo black female with a complex past history.  She underwent an incisional hernia repair and then recurrent hernia repair by Dr. Johney Maine about a year ago.  She has had a complicated course since then with intermittent seromas that she has had percutaneously drained 2 separate times.  She just had one earlier this month that has since been pulled.  Each time her cultures were negative for growth with no evidence of abscesses.  Her last CT scan on 02-14-14 revealed another fluid collection in the RLQ.  IR was going to place a drain tomorrow for this other fluid collection.  2 days ago the patient developed diarrhea and then nausea and vomiting a day later.  She states her RLQ abdominal has worsened some since all of this started.  This is intermittent.  It is worse when she sits up and it goes to her back.  Nothing makes her pain better.  She states she has had some blood in her diarrhea.  Her twins have had N/V recently.  She has also recently has been taking abx therapy.  She noted today that her fever was just under 102 and therefore came to the [facility].  Currently no labs or imaging is back yet.  She does have a fever of 102.5 here and is tachycardic in the 120s.  ROS : Please see HPI.  Family History  Problem Relation Age of Onset  . Diabetes Father   . Kidney disease Father   . Other Neg Hx   . Diabetes Maternal Grandmother   . Hyperlipidemia Paternal Grandmother   . Stroke Paternal Grandmother     Past Medical History  Diagnosis Date  . Spastic hemiplegia affecting dominant side   . Panic disorder without agoraphobia   . Depression   . Anxiety   . Diabetes mellitus   . Hypertension   . Blood transfusion     IN 2012  AFTER C -SECTION  . Cerebral thrombosis with cerebral infarction JUNE 2011    RIGHT SIDED WEAKNESS ( ARM AND LEG ) AND  SPASMS  . Stroke   . Right rotator cuff tear     PAIN IN RIGHT SHOULDER  . Ventral hernia     RIGHT LOWER QUADRANT-CAUSING SOME PAIN  . Anemia     DURING MENSES--HAS HEAVY BLEEDING WITH PERODS  . Headache(784.0)     MIGRAINES--NOT REALLY HEADACHE-MORE LIKE PRESSURE SENSATION IN HEAD-FEELS DIZZIY AND  FAINT AS THE PRESSURE RESOLVES  . Diabetic neuropathy     BOTH FEET --COMES AND GOES  . Restless leg syndrome     DIAGNOSED BY SLEEP STUDY - PT TOLD SHE DID NOT HAVE SLEEP APNEA  . Rash     HANDS, ARMS --STATES HX OF RASH EVER SINCE CHILDBIRTH/PREGNANCY.  STATES THE RASH OFTEN OCCURS WHEN SHE IS REALLY STRESSED.  Marland Kitchen Hernia, incisional, RLQ, s/p lap repair Sep 2013 09/02/2011  . SBO (small bowel obstruction) 06/03/2012  . Back pain, chronic   . Leg pain, right   . Hx of migraines 10/19/2011    Past Surgical History  Procedure Laterality Date  . Cesarean section  2012  . Uterine fibroid surgery      2 SURGERIES FOR FIBROIDS  . Ureter revision    . Ventral hernia repair  10/03/2011    Procedure: LAPAROSCOPIC VENTRAL HERNIA;  Surgeon: Adin Hector, MD;  Location: [facility];  Service: General;  Laterality: N/A;  . Hernia repair  10/03/11    ventral hernia repair  . Esophagogastroduodenoscopy N/A 06/03/2012    Procedure: ESOPHAGOGASTRODUODENOSCOPY (EGD);  Surgeon: [name] Craver, MD;  [facility] END[facility]COPY;  Service: Endoscopy;  Laterality: N/A;  . Diagnostic laparoscopy    . Umbilical hernia repair N/A 02/04/2013    Procedure: LAPAROSCOPIC ventral wall hernia repair LAPAROSCOPIC LYSIS OF ADHESIONS laparoscopic exploration of abdomen ;  Surgeon: Adin Hector, MD;  Location: [facility];  Service: General;  Laterality: N/A;  . Insertion of mesh N/A 02/04/2013    Procedure: INSERTION OF MESH;  Surgeon: Adin Hector, MD;  Location: [facility];  Service: General;  Laterality: N/A;    Social History:  reports that she has never smoked. She has never used smokeless tobacco. She reports that she does not  drink alcohol or use illicit drugs.  Allergies:  Allergies  Allergen Reactions  . Contrast Media [Iodinated Diagnostic Agents] Other (See Comments)    Difficulty breathing  . Iohexol Hives, Nausea And Vomiting and Swelling     Desc: Magnevist-gadolinium-difficulty breathing, throat swelling   . Midazolam Hcl Anaphylaxis    Difficulty breathing  . Shellfish Allergy Anaphylaxis  . Avandia [Rosiglitazone Maleate]     Patient doesn't remember reaction  . Geodon [Ziprasidone]   . Kiwi Extract Itching and Swelling  . Latex Itching  . Metformin And Related Nausea And Vomiting and Other (See Comments)    diarrhea  . Other Itching    Patient is allergic to all nuts except peanuts.      (Not in a hospital admission)  Blood pressure 135/76, pulse 125, temperature 102.5 F (39.2 C), temperature source Oral, resp. rate 22, last menstrual period 02/27/2014, SpO2 100 %. Physical Exam: General: obese black female who is laying in bed in NAD HEENT: head is normocephalic, atraumatic.  Sclera are noninjected.  PERRL.  Ears and nose without any masses or lesions.  Mouth is pink and moist Heart: tachycardic but normal rhythm.  Normal s1,s2. No obvious murmurs, gallops, or rubs noted.  Palpable radial and pedal pulses bilaterally Lungs: CTAB, no wheezes, rhonchi, or rales noted.  Respiratory effort nonlabored Abd: soft, diffusely tender, not greater in one place over the other, ND, +BS, no masses, hernias, or organomegaly.  She does a mass like area in the RLQ that is palpable.  Not fluctuant MS: all 4 extremities are symmetrical with no cyanosis, clubbing, or edema. Skin: warm and dry with no masses, lesions, or rashes Psych: A&Ox3 with an appropriate affect.    Results for orders placed or performed during the hospital encounter of 03/06/14 (from the past 48 hour(s))  Urinalysis, Routine w reflex microscopic     Status: Abnormal   Collection Time: 03/06/14  2:28 PM  Result Value Ref Range    Color, Urine YELLOW YELLOW   APPearance HAZY (A) CLEAR   Specific Gravity, Urine 1.017 1.005 - 1.030   pH 5.0 5.0 - 8.0   Glucose, UA NEGATIVE NEGATIVE mg/dL   Hgb urine dipstick MODERATE (A) NEGATIVE   Bilirubin Urine NEGATIVE NEGATIVE   Ketones, ur 15 (A) NEGATIVE mg/dL   Protein, ur NEGATIVE NEGATIVE mg/dL   Urobilinogen, UA 0.2 0.0 - 1.0 mg/dL   Nitrite NEGATIVE NEGATIVE   Leukocytes, UA NEGATIVE NEGATIVE  Urine microscopic-add on     Status: Abnormal   Collection Time: 03/06/14  2:28 PM  Result Value Ref Range   Squamous Epithelial / LPF  RARE RARE   WBC, UA 0-2 <3 WBC/hpf   RBC / HPF 7-10 <3 RBC/hpf   Bacteria, UA FEW (A) RARE   Urine-Other AMORPHOUS URATES/PHOSPHATES   POC Urine Pregnancy, ED  (If Pre-menopausal female) - do not order at [facility]     Status: None   Collection Time: 03/06/14  2:36 PM  Result Value Ref Range   Preg Test, Ur NEGATIVE NEGATIVE    Comment:        THE SENSITIVITY OF THIS METHODOLOGY IS >24 mIU/mL    No results found.     Assessment/Plan 1. Fever  2. Tachycardia 3. H/o multiple seromas, s/p perc drains (all cultures have been negative) 4. Diarrhea, patient states she's seen blood 5. H/o CVA 6. DM 7. HTN  Plan: 1. Dr. Johney Maine is aware of this patient's presence.  Right now, she doesn't have labs or imaging completed to reveal what her source of fever and tachycardia is coming from.  She has had recent sick contracts as well as recent abx therapy along with some diarrhea.  She likely may still have a fluid collection that is scheduled to be drained tomorrow by IR.  Her prior collections have all been sterile.  She could be at risk for this developing into an infection.   2. A medicine admission for sepsis and further treatment would be recommended by Dr. Johney Maine with him following along.  IR likely needs to be consulted as well for possible perc drain.  We will have more recommendations and information once she has a more complete and thorough work  up.  Dr. Johney Maine will follow along.  Lynnett Langlinais E 03/06/2014, 3:56 PM Pager: [phone]

## 2014-03-06 NOTE — ED Notes (Signed)
Pt here for right flank pain and vomiting. sts also diarrhea. sts hernia surgery 1 year ago and has developed fluid pocket. sts suppose to be getting drained.

## 2014-03-06 NOTE — Progress Notes (Addendum)
ANTIBIOTIC CONSULT NOTE - INITIAL  Pharmacy Consult for Zosyn and Vancomycin Indication: Intra-abdominal infection  Allergies  Allergen Reactions  . Contrast Media [Iodinated Diagnostic Agents] Other (See Comments)    Difficulty breathing  . Iohexol Hives, Nausea And Vomiting and Swelling     Desc: Magnevist-gadolinium-difficulty breathing, throat swelling   . Midazolam Hcl Anaphylaxis    Difficulty breathing  . Shellfish Allergy Anaphylaxis  . Avandia [Rosiglitazone Maleate]     Patient doesn't remember reaction  . Geodon [Ziprasidone]   . Kiwi Extract Itching and Swelling  . Latex Itching  . Metformin And Related Nausea And Vomiting and Other (See Comments)    diarrhea  . Other Itching    Patient is allergic to all nuts except peanuts.     Patient Measurements: TBW - 108 kg in 01/2014  Vital Signs: Temp: 102.5 F (39.2 C) (02/22 1105) Temp Source: Oral (02/22 1105) BP: 121/68 mmHg (02/22 1427) Pulse Rate: 130 (02/22 1427) Intake/Output from previous day:   Intake/Output from this shift:    Labs: No results for input(s): WBC, HGB, PLT, LABCREA, CREATININE in the last 72 hours. CrCl cannot be calculated (Unknown ideal weight.). No results for input(s): VANCOTROUGH, VANCOPEAK, VANCORANDOM, GENTTROUGH, GENTPEAK, GENTRANDOM, TOBRATROUGH, TOBRAPEAK, TOBRARND, AMIKACINPEAK, AMIKACINTROU, AMIKACIN in the last 72 hours.   Microbiology: No results found for this or any previous visit (from the past 720 hour(s)).  Medical History: Past Medical History  Diagnosis Date  . Spastic hemiplegia affecting dominant side   . Panic disorder without agoraphobia   . Depression   . Anxiety   . Diabetes mellitus   . Hypertension   . Blood transfusion     IN 2012  AFTER C -SECTION  . Cerebral thrombosis with cerebral infarction JUNE 2011    RIGHT SIDED WEAKNESS ( ARM AND LEG ) AND SPASMS  . Stroke   . Right rotator cuff tear     PAIN IN RIGHT SHOULDER  . Ventral hernia    RIGHT LOWER QUADRANT-CAUSING SOME PAIN  . Anemia     DURING MENSES--HAS HEAVY BLEEDING WITH PERODS  . Headache(784.0)     MIGRAINES--NOT REALLY HEADACHE-MORE LIKE PRESSURE SENSATION IN HEAD-FEELS DIZZIY AND  FAINT AS THE PRESSURE RESOLVES  . Diabetic neuropathy     BOTH FEET --COMES AND GOES  . Restless leg syndrome     DIAGNOSED BY SLEEP STUDY - PT TOLD SHE DID NOT HAVE SLEEP APNEA  . Rash     HANDS, ARMS --STATES HX OF RASH EVER SINCE CHILDBIRTH/PREGNANCY.  STATES THE RASH OFTEN OCCURS WHEN SHE IS REALLY STRESSED.  Marland Kitchen Hernia, incisional, RLQ, s/p lap repair Sep 2013 09/02/2011  . SBO (small bowel obstruction) 06/03/2012  . Back pain, chronic   . Leg pain, right   . Hx of migraines 10/19/2011    Medications:   (Not in a hospital admission)   Assessment: 44 yo F presents on 2/22 with R flank pain and vomiting. Pharmacy to dose zosyn and vancomycin for intra-abdominal infection. Pt is febrile up to 102.5, other labs pending. Pt given one dose of Zosyn in the ED. SCr back in 09/2013 was 1.29. Will await new SCr before deciding on abx frequency.  Goal of Therapy:  Vancomycin trough level 15-20 mcg/ml  Resolution of infection  Plan:  Give vancomycin 2g IV x 1 Monitor clinical picture F/U SCr and renal function for further dosing  Talal Fritchman J 03/06/2014,3:13 PM  Addendum: SCr came back 1.07. CrCl ~70-67ml/min  Plan: Start Zosyn 3.375g  IV Q8 tonight Start vancomycin 750mg  IV Q12 Monitor renal function, clinical picture

## 2014-03-07 ENCOUNTER — Ambulatory Visit (HOSPITAL_COMMUNITY): Admission: RE | Admit: 2014-03-07 | Payer: Federal, State, Local not specified - PPO | Source: Ambulatory Visit

## 2014-03-07 ENCOUNTER — Inpatient Hospital Stay (HOSPITAL_COMMUNITY): Payer: Federal, State, Local not specified - PPO

## 2014-03-07 DIAGNOSIS — S301XXA Contusion of abdominal wall, initial encounter: Secondary | ICD-10-CM | POA: Insufficient documentation

## 2014-03-07 DIAGNOSIS — T792XXA Traumatic secondary and recurrent hemorrhage and seroma, initial encounter: Secondary | ICD-10-CM

## 2014-03-07 DIAGNOSIS — T888XXA Other specified complications of surgical and medical care, not elsewhere classified, initial encounter: Secondary | ICD-10-CM

## 2014-03-07 LAB — CBC WITH DIFFERENTIAL/PLATELET
Basophils Absolute: 0 10*3/uL (ref 0.0–0.1)
Basophils Relative: 0 % (ref 0–1)
Eosinophils Absolute: 0.1 10*3/uL (ref 0.0–0.7)
Eosinophils Relative: 1 % (ref 0–5)
HCT: 33.1 % — ABNORMAL LOW (ref 36.0–46.0)
Hemoglobin: 10.9 g/dL — ABNORMAL LOW (ref 12.0–15.0)
Lymphocytes Relative: 14 % (ref 12–46)
Lymphs Abs: 0.8 10*3/uL (ref 0.7–4.0)
MCH: 26.5 pg (ref 26.0–34.0)
MCHC: 32.9 g/dL (ref 30.0–36.0)
MCV: 80.5 fL (ref 78.0–100.0)
Monocytes Absolute: 0.3 10*3/uL (ref 0.1–1.0)
Monocytes Relative: 5 % (ref 3–12)
NEUTROS PCT: 80 % — AB (ref 43–77)
Neutro Abs: 4.3 10*3/uL (ref 1.7–7.7)
PLATELETS: 228 10*3/uL (ref 150–400)
RBC: 4.11 MIL/uL (ref 3.87–5.11)
RDW: 13.9 % (ref 11.5–15.5)
WBC: 5.4 10*3/uL (ref 4.0–10.5)

## 2014-03-07 LAB — URINE CULTURE
COLONY COUNT: NO GROWTH
Culture: NO GROWTH

## 2014-03-07 LAB — COMPREHENSIVE METABOLIC PANEL
ALBUMIN: 2.9 g/dL — AB (ref 3.5–5.2)
ALK PHOS: 38 U/L — AB (ref 39–117)
ALT: 17 U/L (ref 0–35)
AST: 22 U/L (ref 0–37)
Anion gap: 6 (ref 5–15)
BUN: 12 mg/dL (ref 6–23)
CHLORIDE: 111 mmol/L (ref 96–112)
CO2: 21 mmol/L (ref 19–32)
Calcium: 7.9 mg/dL — ABNORMAL LOW (ref 8.4–10.5)
Creatinine, Ser: 0.99 mg/dL (ref 0.50–1.10)
GFR calc non Af Amer: 68 mL/min — ABNORMAL LOW (ref >90)
GFR, EST AFRICAN AMERICAN: 79 mL/min — AB (ref >90)
Glucose, Bld: 258 mg/dL — ABNORMAL HIGH (ref 70–99)
POTASSIUM: 3.8 mmol/L (ref 3.5–5.1)
Sodium: 138 mmol/L (ref 135–145)
Total Bilirubin: 0.7 mg/dL (ref 0.3–1.2)
Total Protein: 6 g/dL (ref 6.0–8.3)

## 2014-03-07 LAB — CLOSTRIDIUM DIFFICILE BY PCR: Toxigenic C. Difficile by PCR: NEGATIVE

## 2014-03-07 LAB — GLUCOSE, CAPILLARY
GLUCOSE-CAPILLARY: 176 mg/dL — AB (ref 70–99)
Glucose-Capillary: 122 mg/dL — ABNORMAL HIGH (ref 70–99)
Glucose-Capillary: 215 mg/dL — ABNORMAL HIGH (ref 70–99)
Glucose-Capillary: 219 mg/dL — ABNORMAL HIGH (ref 70–99)
Glucose-Capillary: 232 mg/dL — ABNORMAL HIGH (ref 70–99)

## 2014-03-07 LAB — PROTIME-INR
INR: 1.13 (ref 0.00–1.49)
Prothrombin Time: 14.7 seconds (ref 11.6–15.2)

## 2014-03-07 LAB — APTT: APTT: 28 s (ref 24–37)

## 2014-03-07 MED ORDER — HYDROCODONE-ACETAMINOPHEN 5-325 MG PO TABS
1.0000 | ORAL_TABLET | ORAL | Status: DC | PRN
  Administered 2014-03-07: 2 via ORAL
  Administered 2014-03-08: 1 via ORAL
  Filled 2014-03-07: qty 1
  Filled 2014-03-07: qty 2

## 2014-03-07 MED ORDER — SACCHAROMYCES BOULARDII 250 MG PO CAPS
250.0000 mg | ORAL_CAPSULE | Freq: Two times a day (BID) | ORAL | Status: DC
  Administered 2014-03-07 – 2014-03-08 (×2): 250 mg via ORAL
  Filled 2014-03-07 (×4): qty 1

## 2014-03-07 MED ORDER — FENTANYL CITRATE 0.05 MG/ML IJ SOLN
INTRAMUSCULAR | Status: AC
  Filled 2014-03-07: qty 2

## 2014-03-07 MED ORDER — PREGABALIN 75 MG PO CAPS
75.0000 mg | ORAL_CAPSULE | Freq: Every day | ORAL | Status: DC
  Administered 2014-03-07: 75 mg via ORAL
  Filled 2014-03-07: qty 1

## 2014-03-07 MED ORDER — LIDOCAINE HCL (PF) 1 % IJ SOLN
INTRAMUSCULAR | Status: AC
  Filled 2014-03-07: qty 10

## 2014-03-07 MED ORDER — MIDAZOLAM HCL 2 MG/2ML IJ SOLN
INTRAMUSCULAR | Status: AC
  Filled 2014-03-07: qty 2

## 2014-03-07 MED ORDER — NEBIVOLOL HCL 10 MG PO TABS
10.0000 mg | ORAL_TABLET | Freq: Every morning | ORAL | Status: DC
  Administered 2014-03-08: 10 mg via ORAL
  Filled 2014-03-07: qty 1

## 2014-03-07 MED ORDER — HYDRALAZINE HCL 20 MG/ML IJ SOLN: 10.0000 mg | INTRAMUSCULAR | Status: DC | PRN

## 2014-03-07 MED ORDER — PREGABALIN 75 MG PO CAPS
150.0000 mg | ORAL_CAPSULE | Freq: Every day | ORAL | Status: DC
  Administered 2014-03-08: 150 mg via ORAL
  Filled 2014-03-07: qty 2

## 2014-03-07 MED ORDER — FENTANYL CITRATE 0.05 MG/ML IJ SOLN
INTRAMUSCULAR | Status: AC | PRN
  Administered 2014-03-07 (×2): 50 ug via INTRAVENOUS

## 2014-03-07 NOTE — Consult Note (Signed)
Chief Complaint: Chief Complaint  Patient presents with  . Flank Pain  . Diarrhea  abdominal seroma  Referring Physician(s): Dr Johney Maine  History of Present Illness: Tina [name] is a 44 y.o. female  Incisional hernia surgery 1 yr ago Has had seroma development requiring 2 drain placements since then Drain placed 05/09/13 and 02/02/14 Developed new abdominal pain x few weeks +N/V CT 03/06/14 reveals recollection of abdominal seroma Request for new drain placement per [facility] Dr Laurence Ferrari has reviewed imaging and approves procedure  I have seen and examined pt   Past Medical History  Diagnosis Date  . Spastic hemiplegia affecting dominant side   . Panic disorder without agoraphobia   . Depression   . Anxiety   . Diabetes mellitus   . Hypertension   . Blood transfusion     IN 2012  AFTER C -SECTION  . Cerebral thrombosis with cerebral infarction JUNE 2011    RIGHT SIDED WEAKNESS ( ARM AND LEG ) AND SPASMS  . Stroke   . Right rotator cuff tear     PAIN IN RIGHT SHOULDER  . Ventral hernia     RIGHT LOWER QUADRANT-CAUSING SOME PAIN  . Anemia     DURING MENSES--HAS HEAVY BLEEDING WITH PERODS  . Headache(784.0)     MIGRAINES--NOT REALLY HEADACHE-MORE LIKE PRESSURE SENSATION IN HEAD-FEELS DIZZIY AND  FAINT AS THE PRESSURE RESOLVES  . Diabetic neuropathy     BOTH FEET --COMES AND GOES  . Restless leg syndrome     DIAGNOSED BY SLEEP STUDY - PT TOLD SHE DID NOT HAVE SLEEP APNEA  . Rash     HANDS, ARMS --STATES HX OF RASH EVER SINCE CHILDBIRTH/PREGNANCY.  STATES THE RASH OFTEN OCCURS WHEN SHE IS REALLY STRESSED.  Marland Kitchen Hernia, incisional, RLQ, s/p lap repair Sep 2013 09/02/2011  . SBO (small bowel obstruction) 06/03/2012  . Back pain, chronic   . Leg pain, right   . Hx of migraines 10/19/2011    Past Surgical History  Procedure Laterality Date  . Cesarean section  2012  . Uterine fibroid surgery      2 SURGERIES FOR FIBROIDS  . Ureter revision    . Ventral hernia repair   10/03/2011    Procedure: LAPAROSCOPIC VENTRAL HERNIA;  Surgeon: Adin Hector, MD;  Location: [facility];  Service: General;  Laterality: N/A;  . Hernia repair  10/03/11    ventral hernia repair  . Esophagogastroduodenoscopy N/A 06/03/2012    Procedure: ESOPHAGOGASTRODUODENOSCOPY (EGD);  Surgeon: [name] Craver, MD;  Location: [facility];  Service: Endoscopy;  Laterality: N/A;  . Diagnostic laparoscopy    . Umbilical hernia repair N/A 02/04/2013    Procedure: LAPAROSCOPIC ventral wall hernia repair LAPAROSCOPIC LYSIS OF ADHESIONS laparoscopic exploration of abdomen ;  Surgeon: Adin Hector, MD;  Location: [facility];  Service: General;  Laterality: N/A;  . Insertion of mesh N/A 02/04/2013    Procedure: INSERTION OF MESH;  Surgeon: Adin Hector, MD;  Location: [facility];  Service: General;  Laterality: N/A;    Allergies: Contrast media; Iohexol; Midazolam hcl; Shellfish allergy; Avandia; Geodon; Kiwi extract; Latex; Metformin and related; and Other  Medications: Prior to Admission medications   Medication Sig Start Date End Date Taking? Authorizing Provider  Amlodipine-Valsartan-HCTZ 10-320-25 MG TABS Take 1 tablet by mouth every evening.  01/09/13  Yes Historical Provider, MD  aspirin 325 MG tablet Take 325 mg by mouth at bedtime.    Yes Historical Provider, MD  B-D ULTRAFINE III  SHORT PEN 31G X 8 MM MISC  12/31/12  Yes Historical Provider, MD  Biotin 5000 MCG TABS Take 1 tablet by mouth daily.   Yes Historical Provider, MD  BYSTOLIC 10 MG tablet Take 10 mg by mouth every morning.  01/05/12  Yes Historical Provider, MD  furosemide (LASIX) 40 MG tablet Take 20-40 mg by mouth daily as needed for fluid or edema.    Yes Historical Provider, MD  HYDROcodone-acetaminophen (NORCO/VICODIN) 5-325 MG per tablet Take 1 tablet by mouth every 4 (four) hours as needed for moderate pain.  11/22/13  Yes Historical Provider, MD  insulin glargine (LANTUS) 100 UNIT/ML injection Inject 10-15 Units into the skin 2  (two) times daily. 15 units in the morning and 10 mg in the evening   Yes Historical Provider, MD  Liraglutide (VICTOZA) 18 MG/3ML SOPN Inject 1.2 mLs into the skin daily.   Yes Historical Provider, MD  NOVOLOG FLEXPEN 100 UNIT/ML FlexPen Inject 5-12 Units into the skin 3 (three) times daily with meals.  12/14/13  Yes Historical Provider, MD  pregabalin (LYRICA) 75 MG capsule Take 75-150 mg by mouth 2 (two) times daily. Take 150 mg in the am, and 75 mg in the evening   Yes Historical Provider, MD  Prenatal Vit-Fe Fumarate-FA (PRENATAL MULTIVITAMIN) TABS tablet Take 1 tablet by mouth every evening.    Yes Historical Provider, MD  tiZANidine (ZANAFLEX) 4 MG tablet Take 4 mg by mouth at bedtime. 09/02/13  Yes [name], MD  tiZANidine (ZANAFLEX) 4 MG tablet TAKE 1 TABLET (4 MG TOTAL) BY MOUTH 2 (TWO) TIMES DAILY. 01/16/14  Yes [name], MD  traMADol (ULTRAM) 50 MG tablet TAKE 1 TABLET BY MOUTH TWICE A DAY 10/12/13  Yes [name], MD  doxycycline (VIBRA-TABS) 100 MG tablet Take 100 mg by mouth 2 (two) times daily.    Historical Provider, MD     Family History  Problem Relation Age of Onset  . Diabetes Father   . Kidney disease Father   . Other Neg Hx   . Diabetes Maternal Grandmother   . Hyperlipidemia Paternal Grandmother   . Stroke Paternal Grandmother     History   Social History  . Marital Status: Married    Spouse Name: N/A  . Number of Children: 2  . Years of Education: BA degree   Occupational History  .     Social History Main Topics  . Smoking status: Never Smoker   . Smokeless tobacco: Never Used  . Alcohol Use: No  . Drug Use: No  . Sexual Activity: Yes    Birth Control/ Protection: None   Other Topics Concern  . None   Social History Narrative   Lives with her spouse, sister, mother-in-law, and 2 sone. BA degree. Married 16 yrs. Drinks 2 cups of coffee a month. No tobacco use. Quit alcohol use 2008. No illicit drug use. Right handed.     Review of Systems: A 12 point ROS discussed and pertinent positives are indicated in the HPI above.  All other systems are negative.  Review of Systems  Constitutional: Negative for fever.  Respiratory: Negative for shortness of breath.   Gastrointestinal: Positive for abdominal pain and abdominal distention.  Musculoskeletal: Negative for back pain.  Psychiatric/Behavioral: Negative for behavioral problems and confusion.    Vital Signs: BP 104/55 mmHg  Pulse 95  Temp(Src) 99.1 F (37.3 C) (Oral)  Resp 16  Ht '5\' 6"'  (1.676 m)  Wt 108.3 kg (238  lb 12.1 oz)  BMI 38.55 kg/m2  SpO2 97%  LMP 02/27/2014  Physical Exam  Constitutional: She is oriented to person, place, and time. She appears well-developed and well-nourished.  Cardiovascular: Normal rate, regular rhythm and normal heart sounds.   No murmur heard. Pulmonary/Chest: Effort normal and breath sounds normal. She has no wheezes.  Abdominal: Soft. Bowel sounds are normal. She exhibits distension.  Musculoskeletal: Normal range of motion.  Neurological: She is alert and oriented to person, place, and time.  Skin: Skin is warm and dry.  Psychiatric: She has a normal mood and affect. Her behavior is normal. Judgment and thought content normal.  Nursing note and vitals reviewed.   Mallampati Score:  MD Evaluation Airway: WNL Heart: WNL Abdomen: WNL Chest/ Lungs: WNL ASA  Classification: 3 Mallampati/Airway Score: One  Imaging: Ct Abdomen Pelvis Wo Contrast  03/06/2014   CLINICAL DATA:  Right lower quadrant abdominal pain  EXAM: CT ABDOMEN AND PELVIS WITHOUT CONTRAST  TECHNIQUE: Multidetector CT imaging of the abdomen and pelvis was performed following the standard protocol without IV contrast.  COMPARISON:  02/14/2014  FINDINGS: The fluid collection extending from the right lower quadrant peritoneal space, through the abdominal wall, and into the right lower quadrant subcutaneous fat measures 10.3 x 8.7 cm and  previously measured 8.2 x 9.7 cm. It is slightly larger. The drain has been removed. It is hernia mesh throughout the anterior abdominal wall including the right lower quadrant remains present.  Patchy opacities at both lung bases likely represent atelectasis  Liver, gallbladder, spleen, pancreas, kidneys, and adrenal glands are stable.  Uterus and adnexa are stable. Left ovary remains somewhat prominent.  Free fluid in the pelvis has resolved.  IMPRESSION: Right lower quadrant fluid collection extending through the abdominal wall is slightly larger, today measuring 10.3 x 8.7 cm. The drain has been removed. Repeat drainage may be indicated.   Electronically Signed   By: Marybelle Killings M.D.   On: 03/06/2014 18:23   Ct Abdomen Pelvis Wo Contrast  02/14/2014   CLINICAL DATA:  44 year old with right abdominal wall seroma and percutaneous drainage catheter. History of abdominal hernia repair. Patient reports minimal output from the drain.  EXAM: CT ABDOMEN AND PELVIS WITHOUT CONTRAST  TECHNIQUE: Multidetector CT imaging of the abdomen and pelvis was performed following the standard protocol without IV contrast.  COMPARISON:  02/02/2014 and 01/30/2014  FINDINGS: Small focal peripheral density in the posterior right lower lobe on image 3 is nonspecific. Findings could represent focal volume loss. Again noted is a 4 mm nodule at the left lung base on sequence 3, image 4. This is unchanged since 07/30/2011 and compatible with a small benign nodule.  Again noted is a low-density collection involving the right lower abdominal wall which appears to extend into the anterior abdominal cavity. The subcutaneous component roughly measures 8.2 x 5.1 cm and previously measured 10.4 x 5.6 cm. The more intra-abdominal component measures roughly 7.5 x 3.1 cm and previously measured 8.3 x 3.1 cm. Again noted are inflammatory changes around the abdominal wall collections. The drain is along the lateral periphery of the cavity.  Small  amount of new fluid posterior to the cervix and uterus. Again noted is a small amount of fluid within the abdominal wall along the anterior pelvis and adjacent to the surgical mesh material. Again noted are prominent ovarian tissue. The left ovarian tissue has slightly decreased in size compared to 01/30/2014. Uterine tissue is prominent with a calcified fibroid along the left  posterior aspect.  No acute abnormality involving the liver, gallbladder or spleen. Normal appearance of the pancreas, adrenal glands and right kidney. Again noted is a small fat attenuating lesion along the left kidney lower pole which could represent a small angiomyolipoma, measuring 9 mm, or related to a cortical scar. Again noted are areas of cortical scarring in the left kidney.  There are small lymph nodes along the retroperitoneum which have not significantly changed. No gross abnormality to the small or large bowel. No evidence for an obstructive bowel process.  No acute bone abnormality.  IMPRESSION: The fluid collection in the right lower quadrant subcutaneous tissue has decreased in size but there is still a large fluid component. The drain is along the lateral aspect of this collection which suggests a complex or loculated collection. The fluid collection along the right anterior abdominal cavity has slightly decreased in size. Small amount of new fluid in the pelvis may be physiologic.  Scarring in the left kidney and question a small angiomyolipoma in the left kidney lower pole.  Prominent ovarian tissue but minimal change from the previous examinations.  Drain Management: Ultrasound was performed of the right lower abdomen. Ultrasound suggested that the drain cavity was completely decompressed. There is an adjacent fluid collection which is not communicating with the drain. As a result, the catheter was removed. There was some difficulty removing the suture from the pigtail catheter. This was removed by advancing a small  micropuncture catheter over the suture. A new dressing was placed at the drain exit site.  Patient will be scheduled for a new drain placement in the residual right lower abdominal seroma with ultrasound guidance.   Electronically Signed   By: Markus Daft M.D.   On: 02/14/2014 14:08   Dg Chest Port 1 View  03/06/2014   CLINICAL DATA:  44 year old female with flank pain and diarrhea for 2 days. Initial encounter.  EXAM: PORTABLE CHEST - 1 VIEW  COMPARISON:  10/07/2013 and earlier.  FINDINGS: Portable AP view at 1538 hours. Large body habitus. Mildly lower lung volumes. Normal cardiac size and mediastinal contours. Visualized tracheal air column is within normal limits. Allowing for portable technique, the lungs are clear. No pneumothorax or pneumoperitoneum.  IMPRESSION: No acute cardiopulmonary abnormality.   Electronically Signed   By: Genevie Ann M.D.   On: 03/06/2014 15:58   Ir Radiologist Eval & Mgmt  02/09/2014   EXAM: ESTABLISHED PATIENT OFFICE VISIT  CHIEF COMPLAINT: Leakage around drain catheter  Current Pain Level: 1-10  HISTORY OF PRESENT ILLNESS: Pt had abdominal seroma drain placed 02/02/14.  Has been taking care of it at home:  Calculating output, etc.  Noticed leakage near drain site at skin 1/26---called to inquire as to what to do.  She returns today (1/27) for evaluation of drain and site.  Denies fever or pain.  Denies change in color of output.  Output has diminished per pt.  Has not been flushing at home  PHYSICAL EXAMINATION: Afeb; In NAD  Site is clean and dry.  Only very small amount of leakage noted on gauze dressing: Yellow/serous color.  NT to touch site; no sign of infection  Sutures intact  Output in JP is serous - sl blood tinged  Flushed drain tubing with 10 cc sterile saline---initially met with mild resistance then flowed nicely.  Reflushed and aspirated with additional 10 cc sterile saline without issue.  Not painful to pt.  Placed new gauze dressing.  ASSESSMENT AND PLAN: Abdominal  seroma  drain tubing appeared to possibly be partially clogged with debris.  Flushing and aspirating well now.  Gave pt 10 cc flushes for home use: 1 flush per day---no aspiration at home.  She is scheduled for IR drain clinic 2/2 morning prior to re evaluation at Dr Johney Maine office later that morning.  She has good understanding of plan.  Read by:  [facility]   Electronically Signed   By: Markus Daft M.D.   On: 02/09/2014 07:21    Labs:  CBC:  Recent Labs  10/07/13 1546 02/02/14 0923 03/06/14 1112 03/06/14 1955 03/07/14 0541  WBC 7.1 6.2 7.3  --  5.4  HGB 13.2 11.9* 11.8* 11.2* 10.9*  HCT 38.9 35.2* 35.4* 33.0* 33.1*  PLT 276 298 284  --  228    COAGS:  Recent Labs  05/09/13 0931 02/02/14 0923 03/07/14 0900  INR 0.96 0.93 1.13  APTT 41* 38* 28    BMP:  Recent Labs  10/07/13 1546 03/06/14 1112 03/06/14 1955 03/07/14 0541  NA 138 139 141 138  K 3.6* 4.2 4.1 3.8  CL 100 108 111 111  CO2 22 20  --  21  GLUCOSE 145* 252* 204* 258*  BUN 28* '19 16 12  ' CALCIUM 9.4 8.7  --  7.9*  CREATININE 1.29* 1.07 0.90 0.99  GFRNONAA 50* 62*  --  68*  GFRAA 57* 72*  --  79*    LIVER FUNCTION TESTS:  Recent Labs  03/06/14 1112 03/07/14 0541  BILITOT 0.6 0.7  AST 21 22  ALT 18 17  ALKPHOS 48 38*  PROT 7.0 6.0  ALBUMIN 3.4* 2.9*    TUMOR MARKERS: No results for input(s): AFPTM, CEA, CA199, CHROMGRNA in the last 8760 hours.  Assessment and Plan:  Hernia surgery 1 yr ago Seroma development despite 2 separate drain placements New CT 03/06/14 reveals recollection Now scheduled for abdominal seroma drain placement Pt aware of procedure benefits and risks including but not limited to Infection; bleeding; damage to surrounding structures Agreeable to proceed Consent signed andin chart  Thank you for this interesting consult.  I greatly enjoyed meeting Tina [name] and look forward to participating in their care.  Signed: Aurelia Gras A 03/07/2014, 11:32  AM   I spent a total of 40 Minutes  in face to face in clinical consultation, greater than 50% of which was counseling/coordinating care for abdominal seroma drain placement

## 2014-03-07 NOTE — Progress Notes (Signed)
[facility]  [address] [place], [place] [address] Phone: [phone] FAX: [phone]    Tina [name] [id number] [birth year]  CARE TEAM:  PCP: Dwan Bolt, MD  Outpatient Care Team: Patient Care Team: Anda Kraft, MD as PCP - General Marvene Staff, MD as Consulting Physician (Obstetrics and Gynecology) Antony Contras, MD as Consulting Physician (Neurology) [name] Craver, MD as Consulting Physician (Gastroenterology)  Inpatient Treatment Team: Treatment Team: Attending Provider: Jonetta Osgood, MD; Consulting Physician: Michael Boston, MD; Rounding Team: Sonda Primes, MD   Subjective:  Tired but better Less abd soreness Still with diarrhea C diff negative  Objective:  Vital signs:  Filed Vitals:   03/06/14 2009 03/07/14 0240 03/07/14 0256 03/07/14 0635  BP: 131/64 85/34 140/70 104/55  Pulse: 116 98  95  Temp: 100.6 F (38.1 C) 98.9 F (37.2 C)  99.1 F (37.3 C)  TempSrc: Oral Oral  Oral  Resp: '18 24  16  ' Height: '5\' 6"'  (1.676 m)     Weight: 238 lb 12.1 oz (108.3 kg)     SpO2: 96% 97%  97%    Last BM Date: 03/07/14  Intake/Output   Yesterday:  02/22 0701 - 02/23 0700 In: -  Out: 600 [Urine:600] This shift:  Total I/O In: -  Out: 900 [Urine:900]  Bowel function:  Flatus: y  BM: y, loose  Drain: n/a  Physical Exam:  General: Pt awake/alert/oriented x4 in no acute distress.  Tired but not toxic Eyes: PERRL, normal EOM.  Sclera clear.  No icterus Neuro: CN II-XII intact w/o focal sensory/motor deficits. Lymph: No head/neck/groin lymphadenopathy Psych:  No delerium/psychosis/paranoia HENT: Normocephalic, Mucus membranes moist.  No thrush Neck: Supple, No tracheal deviation Chest: No chest wall pain w good excursion CV:  Pulses intact.  Regular rhythm MS: Normal AROM mjr joints.  No obvious deformity Abdomen: Soft.  Obese Nondistended.  Mildly tender at RLQ abdominal wall seroma  only.  No evidence of peritonitis.  No incarcerated hernias. Ext:  SCDs BLE.  No mjr edema.  No cyanosis Skin: No petechiae / purpura   Problem List:   Active Problems:   Diabetes mellitus type II, uncontrolled   History of CVA (cerebrovascular accident)   HTN (hypertension)   Seroma, postoperative   Sepsis due to undetermined organism   Nausea vomiting and diarrhea   Sepsis   Abdominal wall seroma   Assessment  Tina [name]  44 y.o. female       Stabilizing from N/V/D most likely gastroenteritis  Plan:  -aggressive IVF  -STRICT I&O  -try PO once drain placed  -bowel regimen  -Serroma drainage - see if both areas can be drained since last drain only decompressed superficial pocket & not the deeper abdominal wall pocket.  Cx's negative in past.  Doubt source in infection  -DM control - challenge in this situation  -VTE prophylaxis- SCDs, etc  -mobilize as tolerated to help recovery  The patient is stable.  There is no evidence of peritonitis, acute abdomen, nor shock.  There is no strong evidence of failure of improvement nor decline with current non-operative management.  There is no need for surgery at the present moment.   We will continue to follow.   Adin Hector, M.D., F.A.C.S. Gastrointestinal and [facility]imally Invasive [facility] [address] [place], [place] [address] [phone] Main / Paging   03/07/2014   Results:   Labs: Results for orders  placed or performed during the hospital encounter of 03/06/14 (from the past 48 hour(s))  CBC with Differential     Status: Abnormal   Collection Time: 03/06/14 11:12 AM  Result Value Ref Range   WBC 7.3 4.0 - 10.5 K/uL   RBC 4.53 3.87 - 5.11 MIL/uL   Hemoglobin 11.8 (L) 12.0 - 15.0 g/dL   HCT 35.4 (L) 36.0 - 46.0 %   MCV 78.1 78.0 - 100.0 fL   MCH 26.0 26.0 - 34.0 pg   MCHC 33.3 30.0 - 36.0 g/dL   RDW 13.7 11.5 - 15.5 %   Platelets 284 150 - 400  K/uL   Neutrophils Relative % 93 (H) 43 - 77 %   Neutro Abs 6.8 1.7 - 7.7 K/uL   Lymphocytes Relative 4 (L) 12 - 46 %   Lymphs Abs 0.3 (L) 0.7 - 4.0 K/uL   Monocytes Relative 2 (L) 3 - 12 %   Monocytes Absolute 0.1 0.1 - 1.0 K/uL   Eosinophils Relative 1 0 - 5 %   Eosinophils Absolute 0.0 0.0 - 0.7 K/uL   Basophils Relative 0 0 - 1 %   Basophils Absolute 0.0 0.0 - 0.1 K/uL  Comprehensive metabolic panel     Status: Abnormal   Collection Time: 03/06/14 11:12 AM  Result Value Ref Range   Sodium 139 135 - 145 mmol/L   Potassium 4.2 3.5 - 5.1 mmol/L   Chloride 108 96 - 112 mmol/L   CO2 20 19 - 32 mmol/L   Glucose, Bld 252 (H) 70 - 99 mg/dL   BUN 19 6 - 23 mg/dL   Creatinine, Ser 1.07 0.50 - 1.10 mg/dL   Calcium 8.7 8.4 - 10.5 mg/dL   Total Protein 7.0 6.0 - 8.3 g/dL   Albumin 3.4 (L) 3.5 - 5.2 g/dL   AST 21 0 - 37 U/L   ALT 18 0 - 35 U/L   Alkaline Phosphatase 48 39 - 117 U/L   Total Bilirubin 0.6 0.3 - 1.2 mg/dL   GFR calc non Af Amer 62 (L) >90 mL/min   GFR calc Af Amer 72 (L) >90 mL/min    Comment: (NOTE) The eGFR has been calculated using the CKD EPI equation. This calculation has not been validated in all clinical situations. eGFR's persistently <90 mL/min signify possible Chronic Kidney Disease.    Anion gap 11 5 - 15  Lipase, blood     Status: None   Collection Time: 03/06/14 11:12 AM  Result Value Ref Range   Lipase 37 11 - 59 U/L  Urinalysis, Routine w reflex microscopic     Status: Abnormal   Collection Time: 03/06/14  2:28 PM  Result Value Ref Range   Color, Urine YELLOW YELLOW   APPearance HAZY (A) CLEAR   Specific Gravity, Urine 1.017 1.005 - 1.030   pH 5.0 5.0 - 8.0   Glucose, UA NEGATIVE NEGATIVE mg/dL   Hgb urine dipstick MODERATE (A) NEGATIVE   Bilirubin Urine NEGATIVE NEGATIVE   Ketones, ur 15 (A) NEGATIVE mg/dL   Protein, ur NEGATIVE NEGATIVE mg/dL   Urobilinogen, UA 0.2 0.0 - 1.0 mg/dL   Nitrite NEGATIVE NEGATIVE   Leukocytes, UA NEGATIVE  NEGATIVE  Urine microscopic-add on     Status: Abnormal   Collection Time: 03/06/14  2:28 PM  Result Value Ref Range   Squamous Epithelial / LPF RARE RARE   WBC, UA 0-2 <3 WBC/hpf   RBC / HPF 7-10 <3 RBC/hpf   Bacteria, UA  FEW (A) RARE   Urine-Other AMORPHOUS URATES/PHOSPHATES   POC Urine Pregnancy, ED  (If Pre-menopausal female) - do not order at [facility]     Status: None   Collection Time: 03/06/14  2:36 PM  Result Value Ref Range   Preg Test, Ur NEGATIVE NEGATIVE    Comment:        THE SENSITIVITY OF THIS METHODOLOGY IS >24 mIU/mL   Blood Culture (routine x 2)     Status: None (Preliminary result)   Collection Time: 03/06/14  3:45 PM  Result Value Ref Range   Specimen Description BLOOD RIGHT ARM    Special Requests BOTTLES DRAWN AEROBIC AND ANAEROBIC 5CC    Culture             BLOOD CULTURE RECEIVED NO GROWTH TO DATE CULTURE WILL BE HELD FOR 5 DAYS BEFORE ISSUING A FINAL NEGATIVE REPORT Performed at [facility]    Report Status PENDING   Blood Culture (routine x 2)     Status: None (Preliminary result)   Collection Time: 03/06/14  4:00 PM  Result Value Ref Range   Specimen Description BLOOD HAND LEFT    Special Requests BOTTLES DRAWN AEROBIC AND ANAEROBIC 3CC    Culture             BLOOD CULTURE RECEIVED NO GROWTH TO DATE CULTURE WILL BE HELD FOR 5 DAYS BEFORE ISSUING A FINAL NEGATIVE REPORT Performed at [facility]    Report Status PENDING   I-Stat CG4 Lactic Acid, ED (not at [facility])     Status: Abnormal   Collection Time: 03/06/14  4:04 PM  Result Value Ref Range   Lactic Acid, Venous 2.20 (HH) 0.5 - 2.0 mmol/L   Comment NOTIFIED PHYSICIAN   I-Stat Chem 8, ED     Status: Abnormal   Collection Time: 03/06/14  7:55 PM  Result Value Ref Range   Sodium 141 135 - 145 mmol/L   Potassium 4.1 3.5 - 5.1 mmol/L   Chloride 111 96 - 112 mmol/L   BUN 16 6 - 23 mg/dL   Creatinine, Ser 0.90 0.50 - 1.10 mg/dL   Glucose, Bld 204 (H) 70 - 99 mg/dL   Calcium, Ion 1.07  (L) 1.12 - 1.23 mmol/L   TCO2 15 0 - 100 mmol/L   Hemoglobin 11.2 (L) 12.0 - 15.0 g/dL   HCT 33.0 (L) 36.0 - 46.0 %  Clostridium Difficile by PCR     Status: None   Collection Time: 03/06/14  9:52 PM  Result Value Ref Range   C difficile by pcr NEGATIVE NEGATIVE  Glucose, capillary     Status: Abnormal   Collection Time: 03/07/14 12:18 AM  Result Value Ref Range   Glucose-Capillary 215 (H) 70 - 99 mg/dL  Glucose, capillary     Status: Abnormal   Collection Time: 03/07/14  4:29 AM  Result Value Ref Range   Glucose-Capillary 232 (H) 70 - 99 mg/dL  Comprehensive metabolic panel     Status: Abnormal   Collection Time: 03/07/14  5:41 AM  Result Value Ref Range   Sodium 138 135 - 145 mmol/L   Potassium 3.8 3.5 - 5.1 mmol/L   Chloride 111 96 - 112 mmol/L   CO2 21 19 - 32 mmol/L   Glucose, Bld 258 (H) 70 - 99 mg/dL   BUN 12 6 - 23 mg/dL   Creatinine, Ser 0.99 0.50 - 1.10 mg/dL   Calcium 7.9 (L) 8.4 - 10.5 mg/dL   Total  Protein 6.0 6.0 - 8.3 g/dL   Albumin 2.9 (L) 3.5 - 5.2 g/dL   AST 22 0 - 37 U/L   ALT 17 0 - 35 U/L   Alkaline Phosphatase 38 (L) 39 - 117 U/L   Total Bilirubin 0.7 0.3 - 1.2 mg/dL   GFR calc non Af Amer 68 (L) >90 mL/min   GFR calc Af Amer 79 (L) >90 mL/min    Comment: (NOTE) The eGFR has been calculated using the CKD EPI equation. This calculation has not been validated in all clinical situations. eGFR's persistently <90 mL/min signify possible Chronic Kidney Disease.    Anion gap 6 5 - 15  CBC with Differential/Platelet     Status: Abnormal   Collection Time: 03/07/14  5:41 AM  Result Value Ref Range   WBC 5.4 4.0 - 10.5 K/uL   RBC 4.11 3.87 - 5.11 MIL/uL   Hemoglobin 10.9 (L) 12.0 - 15.0 g/dL   HCT 33.1 (L) 36.0 - 46.0 %   MCV 80.5 78.0 - 100.0 fL   MCH 26.5 26.0 - 34.0 pg   MCHC 32.9 30.0 - 36.0 g/dL   RDW 13.9 11.5 - 15.5 %   Platelets 228 150 - 400 K/uL   Neutrophils Relative % 80 (H) 43 - 77 %   Neutro Abs 4.3 1.7 - 7.7 K/uL   Lymphocytes  Relative 14 12 - 46 %   Lymphs Abs 0.8 0.7 - 4.0 K/uL   Monocytes Relative 5 3 - 12 %   Monocytes Absolute 0.3 0.1 - 1.0 K/uL   Eosinophils Relative 1 0 - 5 %   Eosinophils Absolute 0.1 0.0 - 0.7 K/uL   Basophils Relative 0 0 - 1 %   Basophils Absolute 0.0 0.0 - 0.1 K/uL  Glucose, capillary     Status: Abnormal   Collection Time: 03/07/14  8:17 AM  Result Value Ref Range   Glucose-Capillary 219 (H) 70 - 99 mg/dL  APTT     Status: None   Collection Time: 03/07/14  9:00 AM  Result Value Ref Range   aPTT 28 24 - 37 seconds  Protime-INR     Status: None   Collection Time: 03/07/14  9:00 AM  Result Value Ref Range   Prothrombin Time 14.7 11.6 - 15.2 seconds   INR 1.13 0.00 - 1.49  Glucose, capillary     Status: Abnormal   Collection Time: 03/07/14 12:21 PM  Result Value Ref Range   Glucose-Capillary 176 (H) 70 - 99 mg/dL    Imaging / Studies: Ct Abdomen Pelvis Wo Contrast  03/06/2014   CLINICAL DATA:  Right lower quadrant abdominal pain  EXAM: CT ABDOMEN AND PELVIS WITHOUT CONTRAST  TECHNIQUE: Multidetector CT imaging of the abdomen and pelvis was performed following the standard protocol without IV contrast.  COMPARISON:  02/14/2014  FINDINGS: The fluid collection extending from the right lower quadrant peritoneal space, through the abdominal wall, and into the right lower quadrant subcutaneous fat measures 10.3 x 8.7 cm and previously measured 8.2 x 9.7 cm. It is slightly larger. The drain has been removed. It is hernia mesh throughout the anterior abdominal wall including the right lower quadrant remains present.  Patchy opacities at both lung bases likely represent atelectasis  Liver, gallbladder, spleen, pancreas, kidneys, and adrenal glands are stable.  Uterus and adnexa are stable. Left ovary remains somewhat prominent.  Free fluid in the pelvis has resolved.  IMPRESSION: Right lower quadrant fluid collection extending through the abdominal wall is  slightly larger, today measuring 10.3  x 8.7 cm. The drain has been removed. Repeat drainage may be indicated.   Electronically Signed   By: Marybelle Killings M.D.   On: 03/06/2014 18:23   Dg Chest Port 1 View  03/06/2014   CLINICAL DATA:  44 year old female with flank pain and diarrhea for 2 days. Initial encounter.  EXAM: PORTABLE CHEST - 1 VIEW  COMPARISON:  10/07/2013 and earlier.  FINDINGS: Portable AP view at 1538 hours. Large body habitus. Mildly lower lung volumes. Normal cardiac size and mediastinal contours. Visualized tracheal air column is within normal limits. Allowing for portable technique, the lungs are clear. No pneumothorax or pneumoperitoneum.  IMPRESSION: No acute cardiopulmonary abnormality.   Electronically Signed   By: Genevie Ann M.D.   On: 03/06/2014 15:58    Medications / Allergies: per chart  Antibiotics: Anti-infectives    Start     Dose/Rate Route Frequency Ordered Stop   03/07/14 0400  vancomycin (VANCOCIN) IVPB 750 mg/150 ml premix  Status:  Discontinued     750 mg 150 mL/hr over 60 Minutes Intravenous Every 12 hours 03/06/14 1652 03/06/14 2230   03/07/14 0400  vancomycin (VANCOCIN) IVPB 750 mg/150 ml premix  Status:  Discontinued     750 mg 150 mL/hr over 60 Minutes Intravenous Every 12 hours 03/06/14 2245 03/07/14 0937   03/07/14 0000  piperacillin-tazobactam (ZOSYN) IVPB 3.375 g     3.375 g 12.5 mL/hr over 240 Minutes Intravenous Every 8 hours 03/06/14 2245     03/06/14 2200  piperacillin-tazobactam (ZOSYN) IVPB 3.375 g  Status:  Discontinued     3.375 g 12.5 mL/hr over 240 Minutes Intravenous Every 8 hours 03/06/14 1652 03/06/14 2230   03/06/14 1545  vancomycin (VANCOCIN) 2,000 mg in sodium chloride 0.9 % 500 mL IVPB     2,000 mg 250 mL/hr over 120 Minutes Intravenous  Once 03/06/14 1512 03/06/14 1947   03/06/14 1515  piperacillin-tazobactam (ZOSYN) IVPB 3.375 g     3.375 g 100 mL/hr over 30 Minutes Intravenous  Once 03/06/14 1506 03/06/14 1650       Note: Portions of this report may have been  transcribed using voice recognition software. Every effort was made to ensure accuracy; however, inadvertent computerized transcription errors may be present.   Any transcriptional errors that result from this process are unintentional.

## 2014-03-07 NOTE — Progress Notes (Signed)
PATIENT DETAILS Name: Tina [name] Age: 44 y.o. Sex: female Date of Birth: 03-16-1970 Admit Date: 03/06/2014 Admitting Physician Dot Lanes, MD LYY:[id number],SFKCLE DENNIS, MD  Subjective: Feels much better.  Assessment/Plan: Active Problems:   Sepsis: Sepsis pathophysiology seems to have resolved. Etiology felt to be gastroenteritis. Although has a seroma, this is been a recurrent issue and probably not related to sepsis etiology. Stop vancomycin, continue empirically on Zosyn. Follow blood and stool cultures.     Probable gastroenteritis: Chemically improved, no further vomiting or abdominal pain. Some diarrhea still persists. C. difficile PCR negative.Continue Zosyn     Recurrent postoperative seroma: Interventional radiology consulted, for ultrasound guided drain placement. Will obtain cultures. Continue empiric Zosyn for now    Diabetes mellitus type II, uncontrolled: CBGs moderately controlled, continue Lantus and SSI, once oral intake fully resumed, we will escalate dosing of Lantus. Follow CBGs closely.    History of hypertension: Controlled, continue with Bystolic, amlodipine, Avapro. Follow BP trend and adjust accordingly    History of CVA: Continue aspirin.  Disposition: Remain inpatient  Antibiotics:  See below   Anti-infectives    Start     Dose/Rate Route Frequency Ordered Stop   03/07/14 0400  vancomycin (VANCOCIN) IVPB 750 mg/150 ml premix  Status:  Discontinued     750 mg 150 mL/hr over 60 Minutes Intravenous Every 12 hours 03/06/14 1652 03/06/14 2230   03/07/14 0400  vancomycin (VANCOCIN) IVPB 750 mg/150 ml premix  Status:  Discontinued     750 mg 150 mL/hr over 60 Minutes Intravenous Every 12 hours 03/06/14 2245 03/07/14 0937   03/07/14 0000  piperacillin-tazobactam (ZOSYN) IVPB 3.375 g     3.375 g 12.5 mL/hr over 240 Minutes Intravenous Every 8 hours 03/06/14 2245     03/06/14 2200  piperacillin-tazobactam (ZOSYN) IVPB 3.375 g  Status:   Discontinued     3.375 g 12.5 mL/hr over 240 Minutes Intravenous Every 8 hours 03/06/14 1652 03/06/14 2230   03/06/14 1545  vancomycin (VANCOCIN) 2,000 mg in sodium chloride 0.9 % 500 mL IVPB     2,000 mg 250 mL/hr over 120 Minutes Intravenous  Once 03/06/14 1512 03/06/14 1947   03/06/14 1515  piperacillin-tazobactam (ZOSYN) IVPB 3.375 g     3.375 g 100 mL/hr over 30 Minutes Intravenous  Once 03/06/14 1506 03/06/14 1650      DVT Prophylaxis: SCD's for now-will start pharmacological prophylaxis post drain placement  Code Status: Full code   Family Communication None at bedside  Procedures:  None  CONSULTS:  general surgery and IR  Time spent 40 minutes-which includes 50% of the time with face-to-face with patient/ family and coordinating care related to the above assessment and plan.  MEDICATIONS: Scheduled Meds: . irbesartan  300 mg Oral q1800   And  . amLODipine  10 mg Oral q1800  . aspirin  325 mg Oral QHS  . Barium Sulfate  450 mL Oral Once  . chlorhexidine  15 mL Mouth Rinse BID  . insulin aspart  0-9 Units Subcutaneous TID WC  . insulin glargine  10 Units Subcutaneous QHS  . insulin glargine  15 Units Subcutaneous Daily  . nebivolol  10 mg Oral q morning - 10a  . piperacillin-tazobactam (ZOSYN)  IV  3.375 g Intravenous Q8H  . [START ON 03/08/2014] pregabalin  150 mg Oral Daily  . pregabalin  75 mg Oral QHS  . prenatal multivitamin  1 tablet Oral QPM  . saccharomyces  boulardii  250 mg Oral BID  . traMADol  50 mg Oral BID   Continuous Infusions: . sodium chloride 100 mL/hr (03/07/14 0045)   PRN Meds:.acetaminophen **OR** acetaminophen, bismuth subsalicylate, hydrALAZINE, HYDROcodone-acetaminophen, menthol-cetylpyridinium, morphine injection, ondansetron **OR** ondansetron (ZOFRAN) IV, tiZANidine    PHYSICAL EXAM: Vital signs in last 24 hours: Filed Vitals:   03/06/14 2009 03/07/14 0240 03/07/14 0256 03/07/14 0635  BP: 131/64 85/34 140/70 104/55  Pulse:  116 98  95  Temp: 100.6 F (38.1 C) 98.9 F (37.2 C)  99.1 F (37.3 C)  TempSrc: Oral Oral  Oral  Resp: _0 Height: 5' 6" (1.676 m)     Weight: 108.3 kg (238 lb 12.1 oz)     SpO2: 96% 97%  97%    Weight change:  Filed Weights   03/06/14 2009  Weight: 108.3 kg (238 lb 12.1 oz)   Body mass index is 38.55 kg/(m^2).   Gen Exam: Awake and alert with clear speech.  Neck: Supple, No JVD.   Chest: B/L Clear.   CVS: S1 S2 Regular, no murmurs.  Abdomen: soft, BS +, non tender, non distended.  Extremities: no edema, lower extremities warm to touch. Neurologic: Non Focal.  Skin: No Rash.  Wounds: N/A.    Intake/Output from previous day:  Intake/Output Summary (Last 24 hours) at 03/07/14 1356 Last data filed at 03/07/14 0747  Gross per 24 hour  Intake      0 ml  Output   1500 ml  Net  -1500 ml     LAB RESULTS: CBC  Recent Labs Lab 03/06/14 1112 03/06/14 1955 03/07/14 0541  WBC 7.3  --  5.4  HGB 11.8* 11.2* 10.9*  HCT 35.4* 33.0* 33.1*  PLT 284  --  228  MCV 78.1  --  80.5  MCH 26.0  --  26.5  MCHC 33.3  --  32.9  RDW 13.7  --  13.9  LYMPHSABS 0.3*  --  0.8  MONOABS 0.1  --  0.3  EOSABS 0.0  --  0.1  BASOSABS 0.0  --  0.0    Chemistries   Recent Labs Lab 03/06/14 1112 03/06/14 1955 03/07/14 0541  NA 139 141 138  K 4.2 4.1 3.8  CL 108 111 111  CO2 20  --  21  GLUCOSE 252* 204* 258*  BUN _1 CREATININE 1.07 0.90 0.99  CALCIUM 8.7  --  7.9*    CBG:  Recent Labs Lab 03/07/14 0018 03/07/14 0429 03/07/14 0817 03/07/14 1221  GLUCAP 215* 232* 219* 176*    GFR Estimated Creatinine Clearance: 90.3 mL/min (by C-G formula based on Cr of 0.99).  Coagulation profile  Recent Labs Lab 03/07/14 0900  INR 1.13    Cardiac Enzymes No results for input(s): CKMB, TROPONINI, MYOGLOBIN in the last 168 hours.  Invalid input(s): CK  Invalid input(s): POCBNP No results for input(s): DDIMER in the last 72 hours. No results for input(s):  HGBA1C in the last 72 hours. No results for input(s): CHOL, HDL, LDLCALC, TRIG, CHOLHDL, LDLDIRECT in the last 72 hours. No results for input(s): TSH, T4TOTAL, T3FREE, THYROIDAB in the last 72 hours.  Invalid input(s): FREET3 No results for input(s): VITAMINB12, FOLATE, FERRITIN, TIBC, IRON, RETICCTPCT in the last 72 hours.  Recent Labs  03/06/14 1112  LIPASE 37    Urine Studies No results for input(s): UHGB, CRYS in the last 72 hours.  Invalid input(s): UACOL, UAPR, USPG, UPH, UTP, UGL, UKET, UBIL,  UNIT, UROB, ULEU, UEPI, UWBC, URBC, UBAC, CAST, UCOM, BILUA  MICROBIOLOGY: Recent Results (from the past 240 hour(s))  Blood Culture (routine x 2)     Status: None (Preliminary result)   Collection Time: 03/06/14  3:45 PM  Result Value Ref Range Status   Specimen Description BLOOD RIGHT ARM  Final   Special Requests BOTTLES DRAWN AEROBIC AND ANAEROBIC 5CC  Final   Culture   Final           BLOOD CULTURE RECEIVED NO GROWTH TO DATE CULTURE WILL BE HELD FOR 5 DAYS BEFORE ISSUING A FINAL NEGATIVE REPORT Performed at [facility]    Report Status PENDING  Incomplete  Blood Culture (routine x 2)     Status: None (Preliminary result)   Collection Time: 03/06/14  4:00 PM  Result Value Ref Range Status   Specimen Description BLOOD HAND LEFT  Final   Special Requests BOTTLES DRAWN AEROBIC AND ANAEROBIC 3CC  Final   Culture   Final           BLOOD CULTURE RECEIVED NO GROWTH TO DATE CULTURE WILL BE HELD FOR 5 DAYS BEFORE ISSUING A FINAL NEGATIVE REPORT Performed at [facility]    Report Status PENDING  Incomplete  Clostridium Difficile by PCR     Status: None   Collection Time: 03/06/14  9:52 PM  Result Value Ref Range Status   C difficile by pcr NEGATIVE NEGATIVE Final    RADIOLOGY STUDIES/RESULTS: Ct Abdomen Pelvis Wo Contrast  03/06/2014   CLINICAL DATA:  Right lower quadrant abdominal pain  EXAM: CT ABDOMEN AND PELVIS WITHOUT CONTRAST  TECHNIQUE: Multidetector CT  imaging of the abdomen and pelvis was performed following the standard protocol without IV contrast.  COMPARISON:  02/14/2014  FINDINGS: The fluid collection extending from the right lower quadrant peritoneal space, through the abdominal wall, and into the right lower quadrant subcutaneous fat measures 10.3 x 8.7 cm and previously measured 8.2 x 9.7 cm. It is slightly larger. The drain has been removed. It is hernia mesh throughout the anterior abdominal wall including the right lower quadrant remains present.  Patchy opacities at both lung bases likely represent atelectasis  Liver, gallbladder, spleen, pancreas, kidneys, and adrenal glands are stable.  Uterus and adnexa are stable. Left ovary remains somewhat prominent.  Free fluid in the pelvis has resolved.  IMPRESSION: Right lower quadrant fluid collection extending through the abdominal wall is slightly larger, today measuring 10.3 x 8.7 cm. The drain has been removed. Repeat drainage may be indicated.   Electronically Signed   By: Marybelle Killings M.D.   On: 03/06/2014 18:23   Ct Abdomen Pelvis Wo Contrast  02/14/2014   CLINICAL DATA:  44 year old with right abdominal wall seroma and percutaneous drainage catheter. History of abdominal hernia repair. Patient reports minimal output from the drain.  EXAM: CT ABDOMEN AND PELVIS WITHOUT CONTRAST  TECHNIQUE: Multidetector CT imaging of the abdomen and pelvis was performed following the standard protocol without IV contrast.  COMPARISON:  02/02/2014 and 01/30/2014  FINDINGS: Small focal peripheral density in the posterior right lower lobe on image 3 is nonspecific. Findings could represent focal volume loss. Again noted is a 4 mm nodule at the left lung base on sequence 3, image 4. This is unchanged since 07/30/2011 and compatible with a small benign nodule.  Again noted is a low-density collection involving the right lower abdominal wall which appears to extend into the anterior abdominal cavity. The subcutaneous  component roughly  measures 8.2 x 5.1 cm and previously measured 10.4 x 5.6 cm. The more intra-abdominal component measures roughly 7.5 x 3.1 cm and previously measured 8.3 x 3.1 cm. Again noted are inflammatory changes around the abdominal wall collections. The drain is along the lateral periphery of the cavity.  Small amount of new fluid posterior to the cervix and uterus. Again noted is a small amount of fluid within the abdominal wall along the anterior pelvis and adjacent to the surgical mesh material. Again noted are prominent ovarian tissue. The left ovarian tissue has slightly decreased in size compared to 01/30/2014. Uterine tissue is prominent with a calcified fibroid along the left posterior aspect.  No acute abnormality involving the liver, gallbladder or spleen. Normal appearance of the pancreas, adrenal glands and right kidney. Again noted is a small fat attenuating lesion along the left kidney lower pole which could represent a small angiomyolipoma, measuring 9 mm, or related to a cortical scar. Again noted are areas of cortical scarring in the left kidney.  There are small lymph nodes along the retroperitoneum which have not significantly changed. No gross abnormality to the small or large bowel. No evidence for an obstructive bowel process.  No acute bone abnormality.  IMPRESSION: The fluid collection in the right lower quadrant subcutaneous tissue has decreased in size but there is still a large fluid component. The drain is along the lateral aspect of this collection which suggests a complex or loculated collection. The fluid collection along the right anterior abdominal cavity has slightly decreased in size. Small amount of new fluid in the pelvis may be physiologic.  Scarring in the left kidney and question a small angiomyolipoma in the left kidney lower pole.  Prominent ovarian tissue but minimal change from the previous examinations.  Drain Management: Ultrasound was performed of the right  lower abdomen. Ultrasound suggested that the drain cavity was completely decompressed. There is an adjacent fluid collection which is not communicating with the drain. As a result, the catheter was removed. There was some difficulty removing the suture from the pigtail catheter. This was removed by advancing a small micropuncture catheter over the suture. A new dressing was placed at the drain exit site.  Patient will be scheduled for a new drain placement in the residual right lower abdominal seroma with ultrasound guidance.   Electronically Signed   By: Markus Daft M.D.   On: 02/14/2014 14:08   Dg Chest Port 1 View  03/06/2014   CLINICAL DATA:  44 year old female with flank pain and diarrhea for 2 days. Initial encounter.  EXAM: PORTABLE CHEST - 1 VIEW  COMPARISON:  10/07/2013 and earlier.  FINDINGS: Portable AP view at 1538 hours. Large body habitus. Mildly lower lung volumes. Normal cardiac size and mediastinal contours. Visualized tracheal air column is within normal limits. Allowing for portable technique, the lungs are clear. No pneumothorax or pneumoperitoneum.  IMPRESSION: No acute cardiopulmonary abnormality.   Electronically Signed   By: Genevie Ann M.D.   On: 03/06/2014 15:58   Ir Radiologist Eval & Mgmt  02/09/2014   EXAM: ESTABLISHED PATIENT OFFICE VISIT  CHIEF COMPLAINT: Leakage around drain catheter  Current Pain Level: 1-10  HISTORY OF PRESENT ILLNESS: Pt had abdominal seroma drain placed 02/02/14.  Has been taking care of it at home:  Calculating output, etc.  Noticed leakage near drain site at skin 1/26---called to inquire as to what to do.  She returns today (1/27) for evaluation of drain and site.  Denies fever or  pain.  Denies change in color of output.  Output has diminished per pt.  Has not been flushing at home  PHYSICAL EXAMINATION: Afeb; In NAD  Site is clean and dry.  Only very small amount of leakage noted on gauze dressing: Yellow/serous color.  NT to touch site; no sign of infection   Sutures intact  Output in JP is serous - sl blood tinged  Flushed drain tubing with 10 cc sterile saline---initially met with mild resistance then flowed nicely.  Reflushed and aspirated with additional 10 cc sterile saline without issue.  Not painful to pt.  Placed new gauze dressing.  ASSESSMENT AND PLAN: Abdominal seroma drain tubing appeared to possibly be partially clogged with debris.  Flushing and aspirating well now.  Gave pt 10 cc flushes for home use: 1 flush per day---no aspiration at home.  She is scheduled for IR drain clinic 2/2 morning prior to re evaluation at Dr Johney Maine office later that morning.  She has good understanding of plan.  Read by:  Lavonia Drafts [facility]   Electronically Signed   By: Markus Daft M.D.   On: 02/09/2014 07:21    Oren Binet, MD  [facility] Pager:[phone]  If 7PM-7AM, please contact night-coverage [email or web address] Password [email or web address] 03/07/2014, 1:56 PM   LOS: 1 day

## 2014-03-07 NOTE — Procedures (Signed)
Interventional Radiology Procedure Note  Procedure: Placement 93F drain into RLQ complex fluid collection.  Aspiration yields 160 mL serosanguinous fluid and old blood.  Drain left to JP bulb.  Complications: None Recommendations: - Drain to JP bulb suction - Track output - Follow cx - After D/C can f/u in drain clinic in 2-3 weeks  Signed,  Criselda Peaches, MD

## 2014-03-08 ENCOUNTER — Other Ambulatory Visit: Payer: Self-pay | Admitting: Physical Medicine & Rehabilitation

## 2014-03-08 DIAGNOSIS — T888XXS Other specified complications of surgical and medical care, not elsewhere classified, sequela: Secondary | ICD-10-CM

## 2014-03-08 DIAGNOSIS — T792XXS Traumatic secondary and recurrent hemorrhage and seroma, sequela: Secondary | ICD-10-CM

## 2014-03-08 LAB — BASIC METABOLIC PANEL
Anion gap: 6 (ref 5–15)
BUN: 14 mg/dL (ref 6–23)
CHLORIDE: 107 mmol/L (ref 96–112)
CO2: 24 mmol/L (ref 19–32)
CREATININE: 1.04 mg/dL (ref 0.50–1.10)
Calcium: 7.6 mg/dL — ABNORMAL LOW (ref 8.4–10.5)
GFR, EST AFRICAN AMERICAN: 75 mL/min — AB (ref >90)
GFR, EST NON AFRICAN AMERICAN: 64 mL/min — AB (ref >90)
Glucose, Bld: 213 mg/dL — ABNORMAL HIGH (ref 70–99)
POTASSIUM: 3.8 mmol/L (ref 3.5–5.1)
Sodium: 137 mmol/L (ref 135–145)

## 2014-03-08 LAB — GLUCOSE, CAPILLARY
Glucose-Capillary: 209 mg/dL — ABNORMAL HIGH (ref 70–99)
Glucose-Capillary: 216 mg/dL — ABNORMAL HIGH (ref 70–99)

## 2014-03-08 LAB — CBC
HEMATOCRIT: 31.2 % — AB (ref 36.0–46.0)
Hemoglobin: 10.2 g/dL — ABNORMAL LOW (ref 12.0–15.0)
MCH: 25.9 pg — ABNORMAL LOW (ref 26.0–34.0)
MCHC: 32.7 g/dL (ref 30.0–36.0)
MCV: 79.2 fL (ref 78.0–100.0)
Platelets: 255 10*3/uL (ref 150–400)
RBC: 3.94 MIL/uL (ref 3.87–5.11)
RDW: 14.2 % (ref 11.5–15.5)
WBC: 7.9 10*3/uL (ref 4.0–10.5)

## 2014-03-08 MED ORDER — SACCHAROMYCES BOULARDII 250 MG PO CAPS: 250.0000 mg | ORAL_CAPSULE | Freq: Two times a day (BID) | ORAL | Status: DC

## 2014-03-08 MED ORDER — TRAMADOL HCL 50 MG PO TABS
50.0000 mg | ORAL_TABLET | Freq: Once | ORAL | Status: AC
  Administered 2014-03-08: 50 mg via ORAL
  Filled 2014-03-08: qty 1

## 2014-03-08 MED ORDER — ACETAMINOPHEN-CODEINE #2 300-15 MG PO TABS
1.0000 | ORAL_TABLET | Freq: Four times a day (QID) | ORAL | Status: DC | PRN
Start: 2014-03-08 — End: 2014-10-10

## 2014-03-08 MED ORDER — ONDANSETRON HCL 4 MG PO TABS: 4.0000 mg | ORAL_TABLET | Freq: Four times a day (QID) | ORAL | Status: DC | PRN

## 2014-03-08 MED ORDER — TRAMADOL HCL 50 MG PO TABS
50.0000 mg | ORAL_TABLET | Freq: Three times a day (TID) | ORAL | Status: DC | PRN
  Administered 2014-03-08: 50 mg via ORAL
  Filled 2014-03-08: qty 1

## 2014-03-08 NOTE — Discharge Summary (Addendum)
PATIENT DETAILS Name: Tina [name] Age: 44 y.o. Sex: female Date of Birth: 1970-02-07 MRN: [id number]. Admitting Physician: Dot Lanes, MD EHM:[id number] DENNIS, MD  Admit Date: 03/06/2014 Discharge date: 03/08/2014  Recommendations for Outpatient Follow-up:  1. Please follow Blood, stool and would cultures till final. 2. Optimize Diabetic regimen  PRIMARY DISCHARGE DIAGNOSIS:  Active Problems:   Obesity (BMI 30-39.9)   Diabetes mellitus type II, uncontrolled   Abdominal pain, chronic, right lower quadrant   History of CVA (cerebrovascular accident)   HTN (hypertension)   Recurrent ventral incisional hernia s/p closure/repair w mesh 02/04/2013   Nausea with vomiting ?Gastroparesis?   Sepsis due to undetermined organism   Nausea vomiting and diarrhea   Sepsis   Abdominal wall seroma      PAST MEDICAL HISTORY: Past Medical History  Diagnosis Date  . Spastic hemiplegia affecting dominant side   . Panic disorder without agoraphobia   . Depression   . Anxiety   . Diabetes mellitus   . Hypertension   . Blood transfusion     IN 2012  AFTER C -SECTION  . Cerebral thrombosis with cerebral infarction JUNE 2011    RIGHT SIDED WEAKNESS ( ARM AND LEG ) AND SPASMS  . Stroke   . Right rotator cuff tear     PAIN IN RIGHT SHOULDER  . Ventral hernia     RIGHT LOWER QUADRANT-CAUSING SOME PAIN  . Anemia     DURING MENSES--HAS HEAVY BLEEDING WITH PERODS  . Headache(784.0)     MIGRAINES--NOT REALLY HEADACHE-MORE LIKE PRESSURE SENSATION IN HEAD-FEELS DIZZIY AND  FAINT AS THE PRESSURE RESOLVES  . Diabetic neuropathy     BOTH FEET --COMES AND GOES  . Restless leg syndrome     DIAGNOSED BY SLEEP STUDY - PT TOLD SHE DID NOT HAVE SLEEP APNEA  . Rash     HANDS, ARMS --STATES HX OF RASH EVER SINCE CHILDBIRTH/PREGNANCY.  STATES THE RASH OFTEN OCCURS WHEN SHE IS REALLY STRESSED.  Marland Kitchen Hernia, incisional, RLQ, s/p lap repair Sep 2013 09/02/2011  . SBO (small bowel obstruction)  06/03/2012  . Back pain, chronic   . Leg pain, right   . Hx of migraines 10/19/2011    DISCHARGE MEDICATIONS: Current Discharge Medication List    START taking these medications   Details  acetaminophen-codeine (TYLENOL #2) 300-15 MG per tablet Take 1 tablet by mouth every 6 (six) hours as needed for moderate pain. Qty: 30 tablet, Refills: 0    ondansetron (ZOFRAN) 4 MG tablet Take 1 tablet (4 mg total) by mouth every 6 (six) hours as needed for nausea. Qty: 20 tablet, Refills: 0    saccharomyces boulardii (FLORASTOR) 250 MG capsule Take 1 capsule (250 mg total) by mouth 2 (two) times daily. Qty: 30 capsule, Refills: 0      CONTINUE these medications which have NOT CHANGED   Details  Amlodipine-Valsartan-HCTZ 10-320-25 MG TABS Take 1 tablet by mouth every evening.     aspirin 325 MG tablet Take 325 mg by mouth at bedtime.     B-D ULTRAFINE III SHORT PEN 31G X 8 MM MISC     Biotin 5000 MCG TABS Take 1 tablet by mouth daily.    BYSTOLIC 10 MG tablet Take 10 mg by mouth every morning.     furosemide (LASIX) 40 MG tablet Take 20-40 mg by mouth daily as needed for fluid or edema.     insulin glargine (LANTUS) 100 UNIT/ML injection Inject 10-15 Units into the skin  2 (two) times daily. 15 units in the morning and 10 mg in the evening    Liraglutide (VICTOZA) 18 MG/3ML SOPN Inject 1.2 mLs into the skin daily.    NOVOLOG FLEXPEN 100 UNIT/ML FlexPen Inject 5-12 Units into the skin 3 (three) times daily with meals.  Refills: 99    pregabalin (LYRICA) 75 MG capsule Take 75-150 mg by mouth 2 (two) times daily. Take 150 mg in the am, and 75 mg in the evening    Prenatal Vit-Fe Fumarate-FA (PRENATAL MULTIVITAMIN) TABS tablet Take 1 tablet by mouth every evening.     tiZANidine (ZANAFLEX) 4 MG tablet TAKE 1 TABLET (4 MG TOTAL) BY MOUTH 2 (TWO) TIMES DAILY. Qty: 60 tablet, Refills: 1      STOP taking these medications     HYDROcodone-acetaminophen (NORCO/VICODIN) 5-325 MG per tablet        traMADol (ULTRAM) 50 MG tablet      doxycycline (VIBRA-TABS) 100 MG tablet         ALLERGIES:   Allergies  Allergen Reactions  . Contrast Media [Iodinated Diagnostic Agents] Other (See Comments)    Difficulty breathing  . Iohexol Hives, Nausea And Vomiting and Swelling     Desc: Magnevist-gadolinium-difficulty breathing, throat swelling   . Midazolam Hcl Anaphylaxis    Difficulty breathing  . Shellfish Allergy Anaphylaxis  . Avandia [Rosiglitazone Maleate]     Patient doesn't remember reaction  . Geodon [Ziprasidone]   . Kiwi Extract Itching and Swelling  . Latex Itching  . Metformin And Related Nausea And Vomiting and Other (See Comments)    diarrhea  . Other Itching    Patient is allergic to all nuts except peanuts.     BRIEF HPI:  See H&P, Labs, Consult and Test reports for all details in brief, patient is a 44 y.o. female with history of diabetes mellitus type 2, hypertension, history of stroke, ventral hernia complicated by seroma who has had drainage done 2 weeks prior to admission with plans to get another drain done due to the collection of fluid came to the ER because of worsening right lower quadrant pain with nausea vomiting and diarrhea.  CONSULTATIONS:   general surgery  PERTINENT RADIOLOGIC STUDIES: Ct Abdomen Pelvis Wo Contrast  03/06/2014   CLINICAL DATA:  Right lower quadrant abdominal pain  EXAM: CT ABDOMEN AND PELVIS WITHOUT CONTRAST  TECHNIQUE: Multidetector CT imaging of the abdomen and pelvis was performed following the standard protocol without IV contrast.  COMPARISON:  02/14/2014  FINDINGS: The fluid collection extending from the right lower quadrant peritoneal space, through the abdominal wall, and into the right lower quadrant subcutaneous fat measures 10.3 x 8.7 cm and previously measured 8.2 x 9.7 cm. It is slightly larger. The drain has been removed. It is hernia mesh throughout the anterior abdominal wall including the right lower quadrant  remains present.  Patchy opacities at both lung bases likely represent atelectasis  Liver, gallbladder, spleen, pancreas, kidneys, and adrenal glands are stable.  Uterus and adnexa are stable. Left ovary remains somewhat prominent.  Free fluid in the pelvis has resolved.  IMPRESSION: Right lower quadrant fluid collection extending through the abdominal wall is slightly larger, today measuring 10.3 x 8.7 cm. The drain has been removed. Repeat drainage may be indicated.   Electronically Signed   By: Marybelle Killings M.D.   On: 03/06/2014 18:23   Ct Abdomen Pelvis Wo Contrast  02/14/2014   CLINICAL DATA:  44 year old with right abdominal wall seroma and percutaneous  drainage catheter. History of abdominal hernia repair. Patient reports minimal output from the drain.  EXAM: CT ABDOMEN AND PELVIS WITHOUT CONTRAST  TECHNIQUE: Multidetector CT imaging of the abdomen and pelvis was performed following the standard protocol without IV contrast.  COMPARISON:  02/02/2014 and 01/30/2014  FINDINGS: Small focal peripheral density in the posterior right lower lobe on image 3 is nonspecific. Findings could represent focal volume loss. Again noted is a 4 mm nodule at the left lung base on sequence 3, image 4. This is unchanged since 07/30/2011 and compatible with a small benign nodule.  Again noted is a low-density collection involving the right lower abdominal wall which appears to extend into the anterior abdominal cavity. The subcutaneous component roughly measures 8.2 x 5.1 cm and previously measured 10.4 x 5.6 cm. The more intra-abdominal component measures roughly 7.5 x 3.1 cm and previously measured 8.3 x 3.1 cm. Again noted are inflammatory changes around the abdominal wall collections. The drain is along the lateral periphery of the cavity.  Small amount of new fluid posterior to the cervix and uterus. Again noted is a small amount of fluid within the abdominal wall along the anterior pelvis and adjacent to the surgical mesh  material. Again noted are prominent ovarian tissue. The left ovarian tissue has slightly decreased in size compared to 01/30/2014. Uterine tissue is prominent with a calcified fibroid along the left posterior aspect.  No acute abnormality involving the liver, gallbladder or spleen. Normal appearance of the pancreas, adrenal glands and right kidney. Again noted is a small fat attenuating lesion along the left kidney lower pole which could represent a small angiomyolipoma, measuring 9 mm, or related to a cortical scar. Again noted are areas of cortical scarring in the left kidney.  There are small lymph nodes along the retroperitoneum which have not significantly changed. No gross abnormality to the small or large bowel. No evidence for an obstructive bowel process.  No acute bone abnormality.  IMPRESSION: The fluid collection in the right lower quadrant subcutaneous tissue has decreased in size but there is still a large fluid component. The drain is along the lateral aspect of this collection which suggests a complex or loculated collection. The fluid collection along the right anterior abdominal cavity has slightly decreased in size. Small amount of new fluid in the pelvis may be physiologic.  Scarring in the left kidney and question a small angiomyolipoma in the left kidney lower pole.  Prominent ovarian tissue but minimal change from the previous examinations.  Drain Management: Ultrasound was performed of the right lower abdomen. Ultrasound suggested that the drain cavity was completely decompressed. There is an adjacent fluid collection which is not communicating with the drain. As a result, the catheter was removed. There was some difficulty removing the suture from the pigtail catheter. This was removed by advancing a small micropuncture catheter over the suture. A new dressing was placed at the drain exit site.  Patient will be scheduled for a new drain placement in the residual right lower abdominal seroma  with ultrasound guidance.   Electronically Signed   By: Markus Daft M.D.   On: 02/14/2014 14:08   [name] Abscess Drain  03/08/2014   CLINICAL DATA:  44 year old female with recurrent complex fluid collection within the right lower quadrant abdominal wall and subcutaneous fat. This was previously drained with clinical improvement. However, the fluid has for occurred in is again painful requiring hospital admission. Repeat drain placement will be performed.  EXAM: ULTRASOUND GUIDED ABSCESS  DRAINAGE  Date: 03/08/2014  PROCEDURE: 1. Ultrasound-guided puncture of the complex fluid collection with attempted manual disruption of the internal loculations. 2. Ultrasound-guided drain placement. Interventional Radiologist:  Criselda Peaches, MD  ANESTHESIA/SEDATION: 100 mcg fentanyl administered intravenously  MEDICATIONS: None additional  TECHNIQUE: Informed consent was obtained from the patient following explanation of the procedure, risks, benefits and alternatives. The patient understands, agrees and consents for the procedure. All questions were addressed. A time out was performed.  The right lower quadrant was interrogated with ultrasound. A 9.6 x 9.4 x 8.5 cm complex fluid collection containing multiple internal loculations is identified within the subcutaneous fat of the right lower quadrant abdominal wall. The fluid collection extends deep through the right external oblique musculature and into the preperitoneal space. A suitable skin entry site was selected and marked.  The right lower quadrant was then sterilely prepped and draped in standard fashion using Betadine skin prep. Local anesthesia was attained by infiltration with 1% lidocaine. A small dermatotomy was made. Under real-time sonographic guidance, an 18 gauge trocar needle was advanced into the fluid collection. The trocar needle was then manipulated and used to pierce numerous of the internal loculations.  The needle was then advanced into the deep  component of the fluid collection and a 0.035 Amplatz wire coiled within the collection. The skin tract was then dilated to 12 [place] and a Cook 12 [place] multipurpose drainage catheter modified with additional sideholes (in an effort to drain multiple loculations) was advanced and formed with the locking loop in the deepest portion of the fluid collection.  Approximately 160 mL of serosanguineous bloody fluid was then aspirated. Sonographically, there is near total collapse of the fluid collection following aspiration. The catheter was secured to the skin with 0 Prolene suture and connected to JP bulb suction. A sterile bandage was placed. A sample of the aspirate was sent for culture.  COMPLICATIONS: None  IMPRESSION: 1. Attempted manual disruption of internal loculations within the right lower quadrant complex fluid collection. 2. Successful placement of 12 French percutaneous drainage catheter modified with additional sideholes. Aspiration yields 160 mL serosanguineous bloody fluid. A sample was sent for culture.  PLAN: Maintain tube to JP drainage. Recommend prolonged placement of drainage catheter until the potential space has closed as much as possible. Recommend follow-up in IR drain clinic no earlier than 2-3 weeks.  Signed,  Criselda Peaches, MD  [facility]  [facility]   Electronically Signed   By: Jacqulynn Cadet M.D.   On: 03/08/2014 13:02   Dg Chest Port 1 View  03/06/2014   CLINICAL DATA:  44 year old female with flank pain and diarrhea for 2 days. Initial encounter.  EXAM: PORTABLE CHEST - 1 VIEW  COMPARISON:  10/07/2013 and earlier.  FINDINGS: Portable AP view at 1538 hours. Large body habitus. Mildly lower lung volumes. Normal cardiac size and mediastinal contours. Visualized tracheal air column is within normal limits. Allowing for portable technique, the lungs are clear. No pneumothorax or pneumoperitoneum.  IMPRESSION: No acute  cardiopulmonary abnormality.   Electronically Signed   By: Genevie Ann M.D.   On: 03/06/2014 15:58   Ir Radiologist Eval & Mgmt  02/09/2014   EXAM: ESTABLISHED PATIENT OFFICE VISIT  CHIEF COMPLAINT: Leakage around drain catheter  Current Pain Level: 1-10  HISTORY OF PRESENT ILLNESS: Pt had abdominal seroma drain placed 02/02/14.  Has been taking care of it at home:  Calculating output, etc.  Noticed leakage near drain site at  skin 1/26---called to inquire as to what to do.  She returns today (1/27) for evaluation of drain and site.  Denies fever or pain.  Denies change in color of output.  Output has diminished per pt.  Has not been flushing at home  PHYSICAL EXAMINATION: Afeb; In NAD  Site is clean and dry.  Only very small amount of leakage noted on gauze dressing: Yellow/serous color.  NT to touch site; no sign of infection  Sutures intact  Output in JP is serous - sl blood tinged  Flushed drain tubing with 10 cc sterile saline---initially met with mild resistance then flowed nicely.  Reflushed and aspirated with additional 10 cc sterile saline without issue.  Not painful to pt.  Placed new gauze dressing.  ASSESSMENT AND PLAN: Abdominal seroma drain tubing appeared to possibly be partially clogged with debris.  Flushing and aspirating well now.  Gave pt 10 cc flushes for home use: 1 flush per day---no aspiration at home.  She is scheduled for IR drain clinic 2/2 morning prior to re evaluation at Dr Johney Maine office later that morning.  She has good understanding of plan.  Read by:  Lavonia Drafts [facility]   Electronically Signed   By: Markus Daft M.D.   On: 02/09/2014 07:21     PERTINENT LAB RESULTS: CBC:  Recent Labs  03/07/14 0541 03/08/14 0607  WBC 5.4 7.9  HGB 10.9* 10.2*  HCT 33.1* 31.2*  PLT 228 255   CMET CMP     Component Value Date/Time   NA 137 03/08/2014 0607   K 3.8 03/08/2014 0607   CL 107 03/08/2014 0607   CO2 24 03/08/2014 0607   GLUCOSE 213* 03/08/2014 0607   BUN 14 03/08/2014 0607     CREATININE 1.04 03/08/2014 0607   CALCIUM 7.6* 03/08/2014 0607   PROT 6.0 03/07/2014 0541   ALBUMIN 2.9* 03/07/2014 0541   AST 22 03/07/2014 0541   ALT 17 03/07/2014 0541   ALKPHOS 38* 03/07/2014 0541   BILITOT 0.7 03/07/2014 0541   GFRNONAA 64* 03/08/2014 0607   GFRAA 75* 03/08/2014 0607    GFR Estimated Creatinine Clearance: 86 mL/min (by C-G formula based on Cr of 1.04).  Recent Labs  03/06/14 1112  LIPASE 37   No results for input(s): CKTOTAL, CKMB, CKMBINDEX, TROPONINI in the last 72 hours. Invalid input(s): POCBNP No results for input(s): DDIMER in the last 72 hours. No results for input(s): HGBA1C in the last 72 hours. No results for input(s): CHOL, HDL, LDLCALC, TRIG, CHOLHDL, LDLDIRECT in the last 72 hours. No results for input(s): TSH, T4TOTAL, T3FREE, THYROIDAB in the last 72 hours.  Invalid input(s): FREET3 No results for input(s): VITAMINB12, FOLATE, FERRITIN, TIBC, IRON, RETICCTPCT in the last 72 hours. Coags:  Recent Labs  03/07/14 0900  INR 1.13   Microbiology: Recent Results (from the past 240 hour(s))  Urine culture     Status: None   Collection Time: 03/06/14  3:09 PM  Result Value Ref Range Status   Specimen Description URINE, CLEAN CATCH  Final   Special Requests NONE  Final   Colony Count NO GROWTH Performed at [facility]   Final   Culture NO GROWTH Performed at [facility]   Final   Report Status 03/07/2014 FINAL  Final  Blood Culture (routine x 2)     Status: None (Preliminary result)   Collection Time: 03/06/14  3:45 PM  Result Value Ref Range Status   Specimen Description BLOOD RIGHT  ARM  Final   Special Requests BOTTLES DRAWN AEROBIC AND ANAEROBIC 5CC  Final   Culture   Final           BLOOD CULTURE RECEIVED NO GROWTH TO DATE CULTURE WILL BE HELD FOR 5 DAYS BEFORE ISSUING A FINAL NEGATIVE REPORT Performed at [facility]    Report Status PENDING  Incomplete  Blood Culture (routine x 2)     Status:  None (Preliminary result)   Collection Time: 03/06/14  4:00 PM  Result Value Ref Range Status   Specimen Description BLOOD HAND LEFT  Final   Special Requests BOTTLES DRAWN AEROBIC AND ANAEROBIC 3CC  Final   Culture   Final           BLOOD CULTURE RECEIVED NO GROWTH TO DATE CULTURE WILL BE HELD FOR 5 DAYS BEFORE ISSUING A FINAL NEGATIVE REPORT Performed at [facility]    Report Status PENDING  Incomplete  Clostridium Difficile by PCR     Status: None   Collection Time: 03/06/14  9:52 PM  Result Value Ref Range Status   C difficile by pcr NEGATIVE NEGATIVE Final  Body fluid culture     Status: None (Preliminary result)   Collection Time: 03/07/14  5:17 PM  Result Value Ref Range Status   Specimen Description FLUID RIGHT ABDOMEN  Final   Special Requests Normal  Final   Gram Stain   Final    RARE WBC PRESENT,BOTH PMN AND MONONUCLEAR NO ORGANISMS SEEN Performed at [facility]    Culture NO GROWTH Performed at [facility]   Final   Report Status PENDING  Incomplete     BRIEF HOSPITAL COURSE:    Sepsis: Sepsis pathophysiology seems to have resolved. Etiology felt to be viral gastroenteritis. Although has a seroma, this is been a recurrent issue and probably not related to sepsis etiology. Initially started on empiric  Vancomycin and Zosyn, but all Abx have now been discontinued. Blood cultures negative so far, Wound cultures also negative, Stool C Diff PCR negative,since suspicion for viral etiology, suspect could be discharged without any antibiotics.   Probable gastroenteritis: Suspect viral etiology, resolved with supportive care. No further vomiting or diarrhea at time of discharge. Diet slowly advanced, by day of discharge tolerating a regular diet. Still with mild intermittent nausea, but patient wanting to go home. Will provide prn Zofran.  C. difficile PCR negative.See above-re Abx.   Recurrent postoperative seroma: Interventional radiology  consulted,underwent ultrasound guided drain placement on 2/23. Wound culture neg so far. Since prior cultures have been sterile, suspect does not need any further Abx on discharge. Follow with IR Drain clinic and with Dr Johney Maine.    Diabetes mellitus type II, uncontrolled: CBGs moderately controlled, Resume home insulin regimen on discharge.    History of hypertension: Controlled, continue with Bystolic, amlodipine, Avapro. Follow BP trend and adjust accordingly   History of CVA: Continue aspirin.   TODAY-DAY OF DISCHARGE:  Subjective:   Arbor [place] today has no headache,no chest abdominal pain,no new weakness tingling or numbness, feels much better wants to go home today.   Objective:   Blood pressure 111/61, pulse 87, temperature 99.5 F (37.5 C), temperature source Oral, resp. rate 18, height '5\' 6"'  (1.676 m), weight 108.3 kg (238 lb 12.1 oz), last menstrual period 02/27/2014, SpO2 94 %.  Intake/Output Summary (Last 24 hours) at 03/08/14 1525 Last data filed at 03/08/14 1016  Gross per 24 hour  Intake 2856.67 ml  Output  495 ml  Net 2361.67 ml   Filed Weights   03/06/14 2009  Weight: 108.3 kg (238 lb 12.1 oz)    Exam Awake Alert, Oriented *3, No new F.N deficits, Normal affect [place].AT,PERRAL Supple Neck,No JVD, No cervical lymphadenopathy appriciated.  Symmetrical Chest wall movement, Good air movement bilaterally, CTAB RRR,No Gallops,Rubs or new Murmurs, No Parasternal Heave +ve B.Sounds, Abd Soft, Non tender, No organomegaly appriciated, No rebound -guarding or rigidity. No Cyanosis, Clubbing or edema, No new Rash or bruise  DISCHARGE CONDITION: Stable  DISPOSITION: Home  DISCHARGE INSTRUCTIONS:    Activity:  As tolerated   Diet recommendation: Diabetic Diet Heart Healthy diet   Discharge Instructions    Call MD for:  persistant nausea and vomiting    Complete by:  As directed      Call MD for:  severe uncontrolled pain    Complete by:  As directed       Call MD for:  temperature >100.4    Complete by:  As directed      Diet - low sodium heart healthy    Complete by:  As directed      Diet Carb Modified    Complete by:  As directed      Discharge instructions    Complete by:  As directed   DRAIN CARE:   You have a closed bulb drain to help you heal.  A bulb drain is a small, plastic reservoir which creates a gentle suction. It is used to remove excess fluid from a surgical wound. The color and amount of fluid will vary. Immediately after surgery, the fluid is bright red. It may gradually change to a yellow color. When the amount decreases to about 1 or 2 tablespoons (15 to 30 cc) per 24 hours, your caregiver will usually remove it.  DAILY CARE Keep the bulb compressed at all times, except while emptying it. The compression creates suction.  Keep sites where the tubes enter the skin dry and covered with a light bandage (dressing).  Tape the tubes to your skin, 1 to 2 inches below the insertion sites, to keep from pulling on your stitches. Tubes are stitched in place and will not slip out.  Pin the bulb to your shirt (not to your pants) with a safety pin.  For the first few days after surgery, there usually is more fluid in the bulb. Empty the bulb whenever it becomes half full because the bulb does not create enough suction if it is too full. Include this amount in your 24 hour totals.  When the amount of drainage decreases, empty the bulb at the same time every day. Write down the amounts and the 24 hour totals. Your caregiver will want to know them. This helps your caregiver know when the tubes can be removed.  (We anticipate removing the drain in 1-3 weeks, depending on when the output is <57m a day for 2+ days) If there is drainage around the tube sites, change dressings and keep the area dry. If you see a clot in the tube, leave it alone. However, if the tube does not appear to be draining, let your caregiver know.  TO EMPTY THE  BULB Open the stopper to release suction.  Holding the stopper out of the way, pour drainage into the measuring cup that was sent home with you.  Measure and write down the amount. If there are 2 bulbs, note the amount of drainage from bulb 1 or bulb 2 and  keep the totals separate. Your caregiver will want to know which tube is draining more.  Compress the bulb by folding it in half.  Replace the stopper.  Check the tape that holds the tube to your skin, and pin the bulb to your shirt.  SEEK MEDICAL CARE IF: The drainage develops a bad odor.  You have an oral temperature above 102 F (38.9 C).  The amount of drainage from your wound suddenly increases or decreases.  You accidentally pull out your drain.  You have any other questions or concerns.  MAKE SURE YOU:  Understand these instructions.  Will watch your condition.  Will get help right away if you are not doing well or get worse.     Increase activity slowly    Complete by:  As directed            Follow-up Information    Follow up with GROSS,STEVEN C., MD In 1 month.   Specialty:  General Surgery   Why:  To follow up after your hospital stay   Contact information:   [address] [place] [address] [phone]       Follow up with [facility], ADAM Thurmond Butts, MD. Schedule an appointment as soon as possible for a visit in 2 weeks.   Specialty:  Interventional Radiology   Why:  To have your drain re-checked, possibly repositioned or removed   Contact information:   [address] [place] [address] [phone]       Follow up with Dwan Bolt, MD. Schedule an appointment as soon as possible for a visit in 1 week.   Specialty:  Endocrinology   Contact information:   [address] [place] [address] [phone]       Follow up with [facility].   Why:  HHRN for drain care and supplies   Contact information:   [place]  [address] [phone]      Total Time spent on discharge equals 45 minutes.  SignedOren Binet 03/08/2014 3:25 PM

## 2014-03-08 NOTE — Progress Notes (Signed)
Pt's O2 sat is 87% on RA. Pt having no problems breathing. Placed pt on 2L of O2. Pt's O2 sat on 2L is 96%. Will continue to monitor pt. Ranelle Oyster, RN

## 2014-03-08 NOTE — Progress Notes (Signed)
Inpatient Diabetes Program Recommendations  AACE/ADA: New Consensus Statement on Inpatient Glycemic Control (2013)  Target Ranges:  Prepandial:   less than 140 mg/dL      Peak postprandial:   less than 180 mg/dL (1-2 hours)      Critically ill patients:  140 - 180 mg/dL    Results for BERLENE, DIXSON (MRN [id number]) as of 03/08/2014 10:19  Ref. Range 03/07/2014 08:17 03/07/2014 12:21 03/07/2014 17:19 03/08/2014 07:46  Glucose-Capillary Latest Range: 70-99 mg/dL 219 (H) 176 (H) 122 (H) 209 (H)   Reason for Visit: Sepsis  Diabetes history: DM 2 Outpatient Diabetes medications: Lantus 15 units QAM, Lantus 10 units QPM, Victoza 1.39ml daily, Novolog 5-12 units TID with meals Current orders for Inpatient glycemic control: Lantus 15 units Daily, Novolog 0-9 units TID  Inpatient Diabetes Program Recommendations Insulin - Basal: Noted diet being advanced. Patient's fasting glucose this am 209 mg/dl at 0746. Patient has not recieved scheduled Novolog insulin as of 1012 am. Please consider increasing basal insulin to Lantus 17 units Daily.  Thanks,  Tama Headings RN, MSN, [facility] Inpatient Diabetes Coordinator Team Pager [phone]

## 2014-03-08 NOTE — Discharge Instructions (Signed)
DRAIN CARE:   You have a closed bulb drain to help you heal.  A bulb drain is a small, plastic reservoir which creates a gentle suction. It is used to remove excess fluid from a surgical wound. The color and amount of fluid will vary. Immediately after surgery, the fluid is bright red. It may gradually change to a yellow color. When the amount decreases to about 1 or 2 tablespoons (15 to 30 cc) per 24 hours, your caregiver will usually remove it.  DAILY CARE  Keep the bulb compressed at all times, except while emptying it. The compression creates suction.   Keep sites where the tubes enter the skin dry and covered with a light bandage (dressing).   Tape the tubes to your skin, 1 to 2 inches below the insertion sites, to keep from pulling on your stitches. Tubes are stitched in place and will not slip out.   Pin the bulb to your shirt (not to your pants) with a safety pin.   For the first few days after surgery, there usually is more fluid in the bulb. Empty the bulb whenever it becomes half full because the bulb does not create enough suction if it is too full. Include this amount in your 24 hour totals.   When the amount of drainage decreases, empty the bulb at the same time every day. Write down the amounts and the 24 hour totals. Your caregiver will want to know them. This helps your caregiver know when the tubes can be removed.   (We anticipate removing the drain in 1-3 weeks, depending on when the output is <30mL a day for 2+ days)  If there is drainage around the tube sites, change dressings and keep the area dry. If you see a clot in the tube, leave it alone. However, if the tube does not appear to be draining, let your caregiver know.  TO EMPTY THE BULB  Open the stopper to release suction.   Holding the stopper out of the way, pour drainage into the measuring cup that was sent home with you.   Measure and write down the amount. If there are 2 bulbs, note the amount of drainage  from bulb 1 or bulb 2 and keep the totals separate. Your caregiver will want to know which tube is draining more.   Compress the bulb by folding it in half.   Replace the stopper.   Check the tape that holds the tube to your skin, and pin the bulb to your shirt.  SEEK MEDICAL CARE IF:  The drainage develops a bad odor.   You have an oral temperature above 102 F (38.9 C).   The amount of drainage from your wound suddenly increases or decreases.   You accidentally pull out your drain.   You have any other questions or concerns.  MAKE SURE YOU:   Understand these instructions.   Will watch your condition.   Will get help right away if you are not doing well or get worse.     Call our office if you have any questions about your drain. [phone]  Managing Pain  Pain after surgery or related to activity is often due to strain/injury to muscle, tendon, nerves and/or incisions.  This pain is usually short-term and will improve in a few months.   Many people find it helpful to do the following things TOGETHER to help speed the process of healing and to get back to regular activity more quickly:  1.  Avoid heavy physical activity a.  no lifting greater than 20 pounds b. Do not push through the pain.  Listen to your body and avoid positions and maneuvers than reproduce the pain c. Walking is okay as tolerated, but go slowly and stop when getting sore.  d. Remember: If it hurts to do it, then dont do it! 2. Take Anti-inflammatory medication  a. Take with food/snack around the clock for 1-2 weeks i. This helps the muscle and nerve tissues become less irritable and calm down faster b. Choose ONE of the following over-the-counter medications: i. Naproxen 220mg  tabs (ex. Aleve) 1-2 pills twice a day  ii. Ibuprofen 200mg  tabs (ex. Advil, Motrin) 3-4 pills with every meal and just before bedtime iii. Acetaminophen 500mg  tabs (Tylenol) 1-2 pills with every meal and just before  bedtime 3. Use a Heating pad or Ice/Cold Pack a. 4-6 times a day b. May use warm bath/hottub  or showers 4. Try Gentle Massage and/or Stretching  a. at the area of pain many times a day b. stop if you feel pain - do not overdo it  Try these steps together to help you body heal faster and avoid making things get worse.  Doing just one of these things may not be enough.    If you are not getting better after two weeks or are noticing you are getting worse, contact our office for further advice; we may need to re-evaluate you & see what other things we can do to help.  GETTING TO GOOD BOWEL HEALTH. Irregular bowel habits such as constipation and diarrhea can lead to many problems over time.  Having one soft bowel movement a day is the most important way to prevent further problems.  The anorectal canal is designed to handle stretching and feces to safely manage our ability to get rid of solid waste (feces, poop, stool) out of our body.  BUT, hard constipated stools can act like ripping concrete bricks and diarrhea can be a burning fire to this very sensitive area of our body, causing inflamed hemorrhoids, anal fissures, increasing risk is perirectal abscesses, abdominal pain/bloating, an making irritable bowel worse.     The goal: ONE SOFT BOWEL MOVEMENT A DAY!  To have soft, regular bowel movements:   Drink at least 8 tall glasses of water a day.    Take plenty of fiber.  Fiber is the undigested part of plant food that passes into the colon, acting s natures broom to encourage bowel motility and movement.  Fiber can absorb and hold large amounts of water. This results in a larger, bulkier stool, which is soft and easier to pass. Work gradually over several weeks up to 6 servings a day of fiber (25g a day even more if needed) in the form of: o Vegetables -- Root (potatoes, carrots, turnips), leafy green (lettuce, salad greens, celery, spinach), or cooked high residue (cabbage, broccoli,  etc) o Fruit -- Fresh (unpeeled skin & pulp), Dried (prunes, apricots, cherries, etc ),  or stewed ( applesauce)  o Whole grain breads, pasta, etc (whole wheat)  o Bran cereals   Bulking Agents -- This type of water-retaining fiber generally is easily obtained each day by one of the following:  o Psyllium bran -- The psyllium plant is remarkable because its ground seeds can retain so much water. This product is available as Metamucil, Konsyl, Effersyllium, Per Diem Fiber, or the less expensive generic preparation in drug and health food stores. Although labeled a laxative,  it really is not a laxative.  o Methylcellulose -- This is another fiber derived from wood which also retains water. It is available as Citrucel. o Polyethylene Glycol - and artificial fiber commonly called Miralax or Glycolax.  It is helpful for people with gassy or bloated feelings with regular fiber o Flax Seed - a less gassy fiber than psyllium  No reading or other relaxing activity while on the toilet. If bowel movements take longer than 5 minutes, you are too constipated  AVOID CONSTIPATION.  High fiber and water intake usually takes care of this.  Sometimes a laxative is needed to stimulate more frequent bowel movements, but   Laxatives are not a good long-term solution as it can wear the colon out. o Osmotics (Milk of Magnesia, Fleets phosphosoda, Magnesium citrate, MiraLax, GoLytely) are safer than  o Stimulants (Senokot, Castor Oil, Dulcolax, Ex Lax)    o Do not take laxatives for more than 7days in a row.   IF SEVERELY CONSTIPATED, try a Bowel Retraining Program: o Do not use laxatives.  o Eat a diet high in roughage, such as bran cereals and leafy vegetables.  o Drink six (6) ounces of prune or apricot juice each morning.  o Eat two (2) large servings of stewed fruit each day.  o Take one (1) heaping tablespoon of a psyllium-based bulking agent twice a day. Use sugar-free sweetener when possible to avoid  excessive calories.  o Eat a normal breakfast.  o Set aside 15 minutes after breakfast to sit on the toilet, but do not strain to have a bowel movement.  o If you do not have a bowel movement by the third day, use an enema and repeat the above steps.   Controlling diarrhea o Switch to liquids and simpler foods for a few days to avoid stressing your intestines further. o Avoid dairy products (especially milk & ice cream) for a short time.  The intestines often can lose the ability to digest lactose when stressed. o Avoid foods that cause gassiness or bloating.  Typical foods include beans and other legumes, cabbage, broccoli, and dairy foods.  Every person has some sensitivity to other foods, so listen to our body and avoid those foods that trigger problems for you. o Adding fiber (Citrucel, Metamucil, psyllium, Miralax) gradually can help thicken stools by absorbing excess fluid and retrain the intestines to act more normally.  Slowly increase the dose over a few weeks.  Too much fiber too soon can backfire and cause cramping & bloating. o Probiotics (such as active yogurt, Align, etc) may help repopulate the intestines and colon with normal bacteria and calm down a sensitive digestive tract.  Most studies show it to be of mild help, though, and such products can be costly. o Medicines: - Bismuth subsalicylate (ex. Kayopectate, Pepto Bismol) every 30 minutes for up to 6 doses can help control diarrhea.  Avoid if pregnant. - Loperamide (Immodium) can slow down diarrhea.  Start with two tablets (4mg  total) first and then try one tablet every 6 hours.  Avoid if you are having fevers or severe pain.  If you are not better or start feeling worse, stop all medicines and call your doctor for advice o Call your doctor if you are getting worse or not better.  Sometimes further testing (cultures, endoscopy, X-ray studies, bloodwork, etc) may be needed to help diagnose and treat the cause of the diarrhea.

## 2014-03-08 NOTE — Care Management Note (Signed)
    Page 1 of 1   03/08/2014     3:20:11 PM CARE MANAGEMENT NOTE 03/08/2014  Patient:  Tina [name], Tina [name]   Account Number:  [id number]  Date Initiated:  03/08/2014  Documentation initiated by:  Tomi Bamberger  Subjective/Objective Assessment:   dx admit abd pain  admit-lives with spouse.     Action/Plan:   hhrn   Anticipated DC Date:  03/08/2014   Anticipated DC Plan:  [place]  CM consult      [facility] Choice  HOME HEALTH   Choice offered to / List presented to:  C-1 Patient        [place] arranged  HH-1 RN      [place].   Status of service:  Completed, signed off Medicare Important Message given?  NO (If response is "NO", the following Medicare IM given date fields will be blank) Date Medicare IM given:   Medicare IM given by:   Date Additional Medicare IM given:   Additional Medicare IM given by:    Discharge Disposition:  [place]  Per UR Regulation:  Reviewed for med. necessity/level of care/duration of stay  If discussed at [place] of Stay Meetings, dates discussed:    Comments:  03/08/14 [place], BSN [phone] patient is for dc today, she chose [facility] for [facility] for drain care, referral made to [facility], [place] notified.  Soc will begin 24-48 hrs post dc.

## 2014-03-08 NOTE — Progress Notes (Signed)
Tina [name] to be D/C'd Home per MD order.  Discussed with the patient and all questions fully answered.    Medication List    STOP taking these medications        doxycycline 100 MG tablet  Commonly known as:  VIBRA-TABS     HYDROcodone-acetaminophen 5-325 MG per tablet  Commonly known as:  NORCO/VICODIN     traMADol 50 MG tablet  Commonly known as:  ULTRAM      TAKE these medications        acetaminophen-codeine 300-15 MG per tablet  Commonly known as:  TYLENOL #2  Take 1 tablet by mouth every 6 (six) hours as needed for moderate pain.     Amlodipine-Valsartan-HCTZ 10-320-25 MG Tabs  Take 1 tablet by mouth every evening.     aspirin 325 MG tablet  Take 325 mg by mouth at bedtime.     B-D ULTRAFINE III SHORT PEN 31G X 8 MM Misc  Generic drug:  Insulin Pen Needle     Biotin 5000 MCG Tabs  Take 1 tablet by mouth daily.     BYSTOLIC 10 MG tablet  Generic drug:  nebivolol  Take 10 mg by mouth every morning.     furosemide 40 MG tablet  Commonly known as:  LASIX  Take 20-40 mg by mouth daily as needed for fluid or edema.     insulin glargine 100 UNIT/ML injection  Commonly known as:  LANTUS  Inject 10-15 Units into the skin 2 (two) times daily. 15 units in the morning and 10 mg in the evening     NOVOLOG FLEXPEN 100 UNIT/ML FlexPen  Generic drug:  insulin aspart  Inject 5-12 Units into the skin 3 (three) times daily with meals.     ondansetron 4 MG tablet  Commonly known as:  ZOFRAN  Take 1 tablet (4 mg total) by mouth every 6 (six) hours as needed for nausea.     pregabalin 75 MG capsule  Commonly known as:  LYRICA  Take 75-150 mg by mouth 2 (two) times daily. Take 150 mg in the am, and 75 mg in the evening     prenatal multivitamin Tabs tablet  Take 1 tablet by mouth every evening.     saccharomyces boulardii 250 MG capsule  Commonly known as:  FLORASTOR  Take 1 capsule (250 mg total) by mouth 2 (two) times daily.     tiZANidine 4 MG tablet   Commonly known as:  ZANAFLEX  TAKE 1 TABLET (4 MG TOTAL) BY MOUTH 2 (TWO) TIMES DAILY.     VICTOZA 18 MG/3ML Sopn  Generic drug:  Liraglutide  Inject 1.2 mLs into the skin daily.        VVS, Skin clean, dry and intact without evidence of skin break down, no evidence of skin tears noted. IV catheter discontinued intact. Site without signs and symptoms of complications. Dressing and pressure applied. JP drain flushed with 5cc per order, emptied and and to suction. Dressing clean, dry and intct.   An After Visit Summary was printed and given to the patient.  D/c education completed with patient/family including follow up instructions, medication list, d/c activities limitations if indicated, with other d/c instructions as indicated by MD - patient able to verbalize understanding, all questions fully answered.   Patient instructed to return to ED, call 911, or call MD for any changes in condition.   Patient escorted via [place], and D/C home via private auto.  Delman Cheadle  03/08/2014 4:33 PM

## 2014-03-08 NOTE — Progress Notes (Signed)
Referring Physician(s): Dr Johney Maine  Subjective:  abd seroma drain placed 2/23 Feels less pressure Sore at site  Allergies: Contrast media; Iohexol; Midazolam hcl; Shellfish allergy; Avandia; Geodon; Kiwi extract; Latex; Metformin and related; and Other  Medications: Prior to Admission medications   Medication Sig Start Date End Date Taking? Authorizing Provider  Amlodipine-Valsartan-HCTZ 10-320-25 MG TABS Take 1 tablet by mouth every evening.  01/09/13  Yes Historical Provider, MD  aspirin 325 MG tablet Take 325 mg by mouth at bedtime.    Yes Historical Provider, MD  B-D ULTRAFINE III SHORT PEN 31G X 8 MM MISC  12/31/12  Yes Historical Provider, MD  Biotin 5000 MCG TABS Take 1 tablet by mouth daily.   Yes Historical Provider, MD  BYSTOLIC 10 MG tablet Take 10 mg by mouth every morning.  01/05/12  Yes Historical Provider, MD  furosemide (LASIX) 40 MG tablet Take 20-40 mg by mouth daily as needed for fluid or edema.    Yes Historical Provider, MD  HYDROcodone-acetaminophen (NORCO/VICODIN) 5-325 MG per tablet Take 1 tablet by mouth every 4 (four) hours as needed for moderate pain.  11/22/13  Yes Historical Provider, MD  insulin glargine (LANTUS) 100 UNIT/ML injection Inject 10-15 Units into the skin 2 (two) times daily. 15 units in the morning and 10 mg in the evening   Yes Historical Provider, MD  Liraglutide (VICTOZA) 18 MG/3ML SOPN Inject 1.2 mLs into the skin daily.   Yes Historical Provider, MD  NOVOLOG FLEXPEN 100 UNIT/ML FlexPen Inject 5-12 Units into the skin 3 (three) times daily with meals.  12/14/13  Yes Historical Provider, MD  pregabalin (LYRICA) 75 MG capsule Take 75-150 mg by mouth 2 (two) times daily. Take 150 mg in the am, and 75 mg in the evening   Yes Historical Provider, MD  Prenatal Vit-Fe Fumarate-FA (PRENATAL MULTIVITAMIN) TABS tablet Take 1 tablet by mouth every evening.    Yes Historical Provider, MD  tiZANidine (ZANAFLEX) 4 MG tablet Take 4 mg by mouth at bedtime.  09/02/13  Yes [name], MD  tiZANidine (ZANAFLEX) 4 MG tablet TAKE 1 TABLET (4 MG TOTAL) BY MOUTH 2 (TWO) TIMES DAILY. 01/16/14  Yes [name], MD  traMADol (ULTRAM) 50 MG tablet TAKE 1 TABLET BY MOUTH TWICE A DAY 10/12/13  Yes [name], MD  doxycycline (VIBRA-TABS) 100 MG tablet Take 100 mg by mouth 2 (two) times daily.    Historical Provider, MD     Vital Signs: BP 106/62 mmHg  Pulse 78  Temp(Src) 98.1 F (36.7 C) (Oral)  Resp 22  Ht 5\' 6"  (1.676 m)  Wt 108.3 kg (238 lb 12.1 oz)  BMI 38.55 kg/m2  SpO2 96%  LMP 02/27/2014  Physical Exam  Abdominal: Soft.  Drain intact Site clean and dry Sl tender No bleeding Output bloody: 160 cc at procedure; 250 yesterday cx no growth  Nursing note and vitals reviewed.   Imaging: Ct Abdomen Pelvis Wo Contrast  03/06/2014   CLINICAL DATA:  Right lower quadrant abdominal pain  EXAM: CT ABDOMEN AND PELVIS WITHOUT CONTRAST  TECHNIQUE: Multidetector CT imaging of the abdomen and pelvis was performed following the standard protocol without IV contrast.  COMPARISON:  02/14/2014  FINDINGS: The fluid collection extending from the right lower quadrant peritoneal space, through the abdominal wall, and into the right lower quadrant subcutaneous fat measures 10.3 x 8.7 cm and previously measured 8.2 x 9.7 cm. It is slightly larger. The drain has been removed. It  is hernia mesh throughout the anterior abdominal wall including the right lower quadrant remains present.  Patchy opacities at both lung bases likely represent atelectasis  Liver, gallbladder, spleen, pancreas, kidneys, and adrenal glands are stable.  Uterus and adnexa are stable. Left ovary remains somewhat prominent.  Free fluid in the pelvis has resolved.  IMPRESSION: Right lower quadrant fluid collection extending through the abdominal wall is slightly larger, today measuring 10.3 x 8.7 cm. The drain has been removed. Repeat drainage may be indicated.   Electronically Signed    By: Marybelle Killings M.D.   On: 03/06/2014 18:23   Dg Chest Port 1 View  03/06/2014   CLINICAL DATA:  44 year old female with flank pain and diarrhea for 2 days. Initial encounter.  EXAM: PORTABLE CHEST - 1 VIEW  COMPARISON:  10/07/2013 and earlier.  FINDINGS: Portable AP view at 1538 hours. Large body habitus. Mildly lower lung volumes. Normal cardiac size and mediastinal contours. Visualized tracheal air column is within normal limits. Allowing for portable technique, the lungs are clear. No pneumothorax or pneumoperitoneum.  IMPRESSION: No acute cardiopulmonary abnormality.   Electronically Signed   By: Genevie Ann M.D.   On: 03/06/2014 15:58    Labs:  CBC:  Recent Labs  02/02/14 0923 03/06/14 1112 03/06/14 1955 03/07/14 0541 03/08/14 0607  WBC 6.2 7.3  --  5.4 7.9  HGB 11.9* 11.8* 11.2* 10.9* 10.2*  HCT 35.2* 35.4* 33.0* 33.1* 31.2*  PLT 298 284  --  228 255    COAGS:  Recent Labs  05/09/13 0931 02/02/14 0923 03/07/14 0900  INR 0.96 0.93 1.13  APTT 41* 38* 28    BMP:  Recent Labs  10/07/13 1546 03/06/14 1112 03/06/14 1955 03/07/14 0541 03/08/14 0607  NA 138 139 141 138 137  K 3.6* 4.2 4.1 3.8 3.8  CL 100 108 111 111 107  CO2 22 20  --  21 24  GLUCOSE 145* 252* 204* 258* 213*  BUN 28* 19 16 12 14   CALCIUM 9.4 8.7  --  7.9* 7.6*  CREATININE 1.29* 1.07 0.90 0.99 1.04  GFRNONAA 50* 62*  --  68* 64*  GFRAA 57* 72*  --  79* 75*    LIVER FUNCTION TESTS:  Recent Labs  03/06/14 1112 03/07/14 0541  BILITOT 0.6 0.7  AST 21 22  ALT 18 17  ALKPHOS 48 38*  PROT 7.0 6.0  ALBUMIN 3.4* 2.9*    Assessment and Plan:  Abd seroma drain in place Will follow May follow in IR drain clinic 2-3 weeks Call [phone] if need appt for clinic  Signed: Dierks Wach A 03/08/2014, 8:42 AM   I spent a total of 15 Minutes in face to face in clinical consultation/evaluation, greater than 50% of which was counseling/coordinating care for abd seroma drain

## 2014-03-08 NOTE — Progress Notes (Signed)
Notified Schorr, NP that pt requesting pain medication. Pt requesting ultram, pt doesn't like the way the Vicodin makes her feel. Pt receives ultram scheduled twice a day, next dose not due till 1000. Np put in order for ultram 50mg  po x1. Will continue to monitor pt. Ranelle Oyster, RN

## 2014-03-08 NOTE — Progress Notes (Signed)
Tina [name]  05/17/1970 [id number]  Patient Care Team: Tina Kraft, [name] as PCP - General Tina Clint Bolder, [name] as Consulting Physician (Obstetrics and Gynecology) Tina Contras, [name] as Consulting Physician (Neurology) Tina Craver, [name] as Consulting Physician (Gastroenterology)   Drain placed Mild pain   Assessment  Tina [name] 44 y.o. female      Stabilizing from N/V/D most likely gastroenteritis  Plan:  -wean IVF  -on fulls.  Adv diet  -bowel regimen  -Serroma drainage - f/u drain clinic in 2 weeks for re-evasl with possible reposistioning since loculated vs removal.  D/w Dr Tina [name].  Pt changed f/u drain appt & I believe that is how 2nd drain placement was delayed by 3 weeks.  IR wanted 2 days.  -f/u seroma culture.  Cx's negative in past. Doubt source in infection  -DM control - challenge in this situation  -VTE prophylaxis- SCDs, etc  -mobilize as tolerated to help recovery  The patient is stable. There is no evidence of peritonitis, acute abdomen, nor shock. There is no strong evidence of failure of improvement nor decline with current non-operative management. There is no need for surgery at the present moment. We will continue to follow.   Tina [name], M.D., F.A.C.S. Gastrointestinal and Minimally Invasive Surgery [facility] [address], [place], [place] [address] [phone] Main / Paging   Patient Active Problem List   Diagnosis Date Noted  . Abdominal wall seroma   . Sepsis due to undetermined organism 03/06/2014  . Nausea vomiting and diarrhea 03/06/2014  . Sepsis 03/06/2014  . Tension headache 09/01/2013  . Nausea with vomiting ?Gastroparesis? 03/21/2013  . Spastic hemiplegia affecting dominant side 02/16/2013  . Recurrent ventral incisional hernia s/p closure/repair w mesh 02/04/2013 01/26/2013  . Constipation, chronic 01/26/2013  . Abdominal pain, chronic, right lower quadrant  06/03/2012  . History of CVA (cerebrovascular accident) 06/03/2012  . HTN (hypertension) 06/03/2012  . Diabetes mellitus type II, uncontrolled 10/16/2011  . Obesity (BMI 30-39.9) 09/02/2011  . Rotator cuff tear 08/25/2011  . Sciatica 06/10/2011  . Paresthesias in right hand 06/10/2011  . Lumbago 05/09/2011  . Paresthesias 05/09/2011    Past Medical History  Diagnosis Date  . Spastic hemiplegia affecting dominant side   . Panic disorder without agoraphobia   . Depression   . Anxiety   . Diabetes mellitus   . Hypertension   . Blood transfusion     IN 2012  AFTER C -SECTION  . Cerebral thrombosis with cerebral infarction JUNE 2011    RIGHT SIDED WEAKNESS ( ARM AND LEG ) AND SPASMS  . Stroke   . Right rotator cuff tear     PAIN IN RIGHT SHOULDER  . Ventral hernia     RIGHT LOWER QUADRANT-CAUSING SOME PAIN  . Anemia     DURING MENSES--HAS HEAVY BLEEDING WITH PERODS  . Headache(784.0)     MIGRAINES--NOT REALLY HEADACHE-MORE LIKE PRESSURE SENSATION IN HEAD-FEELS DIZZIY AND  FAINT AS THE PRESSURE RESOLVES  . Diabetic neuropathy     BOTH FEET --COMES AND GOES  . Restless leg syndrome     DIAGNOSED BY SLEEP STUDY - PT TOLD SHE DID NOT HAVE SLEEP APNEA  . Rash     HANDS, ARMS --STATES HX OF RASH EVER SINCE CHILDBIRTH/PREGNANCY.  STATES THE RASH OFTEN OCCURS WHEN SHE IS REALLY STRESSED.  Marland Kitchen Hernia, incisional, RLQ, s/p lap repair Sep 2013 09/02/2011  . SBO (small bowel obstruction) 06/03/2012  . Back  pain, chronic   . Leg pain, right   . Hx of migraines 10/19/2011    Past Surgical History  Procedure Laterality Date  . Cesarean section  2012  . Uterine fibroid surgery      2 SURGERIES FOR FIBROIDS  . Ureter revision    . Ventral hernia repair  10/03/2011    Procedure: LAPAROSCOPIC VENTRAL HERNIA;  Surgeon: Tina [name];  Location: [facility];  Service: General;  Laterality: N/A;  . Hernia repair  10/03/11    ventral hernia repair  . Esophagogastroduodenoscopy N/A 06/03/2012     Procedure: ESOPHAGOGASTRODUODENOSCOPY (EGD);  Surgeon: Tina Craver, [name];  Location[facility];  Service: Endoscopy;  Laterality: N/A;  . Diagnostic laparoscopy    . Umbilical hernia repair N/A 02/04/2013    Procedure: LAPAROSCOPIC ventral wall hernia repair LAPAROSCOPIC LYSIS OF ADHESIONS laparoscopic exploration of abdomen ;  Surgeon: Tina [name];  Location: [facility];  Service: General;  Laterality: N/A;  . Insertion of mesh N/A 02/04/2013    Procedure: INSERTION OF MESH;  Surgeon: Tina [name];  Location: [facility];  Service: General;  Laterality: N/A;    History   Social History  . Marital Status: Married    Spouse Name: N/A  . Number of Children: 2  . Years of Education: BA degree   Occupational History  .     Social History Main Topics  . Smoking status: Never Smoker   . Smokeless tobacco: Never Used  . Alcohol Use: No  . Drug Use: No  . Sexual Activity: Yes    Birth Control/ Protection: None   Other Topics Concern  . Not on file   Social History Narrative   Lives with her spouse, sister, mother-in-law, and 2 sone. BA degree. Married 16 yrs. Drinks 2 cups of coffee a month. No tobacco use. Quit alcohol use 2008. No illicit drug use. Right handed.    Family History  Problem Relation Age of Onset  . Diabetes Father   . Kidney disease Father   . Other Neg Hx   . Diabetes Maternal Grandmother   . Hyperlipidemia Paternal Grandmother   . Stroke Paternal Grandmother     Current Facility-Administered Medications  Medication Dose Route Frequency Provider Last Rate Last Dose  . acetaminophen (TYLENOL) tablet 650 mg  650 mg Oral Q6H PRN Tina Patience, [name]       Or  . acetaminophen (TYLENOL) suppository 650 mg  650 mg Rectal Q6H PRN Tina Patience, [name]      . irbesartan (AVAPRO) tablet 300 mg  300 mg Oral q1800 Tina Osgood, [name]   300 mg at 03/07/14 1752   And  . amLODipine (NORVASC) tablet 10 mg  10 mg Oral q1800 Tina Osgood, [name]   10 mg at  03/07/14 1752  . aspirin tablet 325 mg  325 mg Oral QHS Tina Patience, [name]   325 mg at 03/07/14 2237  . Barium Sulfate 2.1 % SUSP 450 mL  450 mL Oral Once Tina Patches, [name]      . bismuth subsalicylate (PEPTO BISMOL) 262 MG/15ML suspension 30 mL  30 mL Oral Q8H PRN Michael Boston, [name]      . chlorhexidine (PERIDEX) 0.12 % solution 15 mL  15 mL Mouth Rinse BID Tina Patience, [name]   15 mL at 03/07/14 0029  . hydrALAZINE (APRESOLINE) injection 10 mg  10 mg Intravenous Q4H PRN Tina Patience, [name]      .  HYDROcodone-acetaminophen (NORCO/VICODIN) 5-325 MG per tablet 1-2 tablet  1-2 tablet Oral Q4H PRN Jacqulynn Cadet, [name]   2 tablet at 03/07/14 1927  . insulin aspart (novoLOG) injection 0-9 Units  0-9 Units Subcutaneous TID WC Tina Patience, [name]   2 Units at 03/07/14 1316  . insulin glargine (LANTUS) injection 15 Units  15 Units Subcutaneous Daily Tina Patience, [name]   15 Units at 03/07/14 1111  . menthol-cetylpyridinium (CEPACOL) lozenge 3 mg  1 lozenge Oral PRN Michael Boston, [name]      . morphine 2 MG/ML injection 1 mg  1 mg Intravenous Q4H PRN Tina Patience, [name]      . nebivolol (BYSTOLIC) tablet 10 mg  10 mg Oral q morning - 10a Shanker Kristeen Mans, [name]      . ondansetron [facility]) tablet 4 mg  4 mg Oral Q6H PRN Tina Patience, [name]       Or  . ondansetron [facility]) injection 4 mg  4 mg Intravenous Q6H PRN Tina Patience, [name]   4 mg at 03/08/14 0808  . piperacillin-tazobactam (ZOSYN) IVPB 3.375 g  3.375 g Intravenous Q8H Tina Patience, [name]   3.375 g at 03/08/14 0801  . pregabalin (LYRICA) capsule 150 mg  150 mg Oral Daily Shanker Kristeen Mans, [name]      . pregabalin (LYRICA) capsule 75 mg  75 mg Oral QHS Tina Osgood, [name]   75 mg at 03/07/14 2237  . prenatal multivitamin tablet 1 tablet  1 tablet Oral QPM Tina Patience, [name]   1 tablet at 03/07/14 0020  . saccharomyces boulardii (FLORASTOR) capsule 250 mg  250 mg Oral BID Tina Osgood, [name]   250 mg at 03/07/14  2237  . tiZANidine (ZANAFLEX) tablet 4 mg  4 mg Oral Q8H PRN Tina Patience, [name]   4 mg at 03/07/14 2253  . traMADol (ULTRAM) tablet 50 mg  50 mg Oral BID Tina Patience, [name]   50 mg at 03/07/14 2237     Allergies  Allergen Reactions  . Contrast Media [Iodinated Diagnostic Agents] Other (See Comments)    Difficulty breathing  . Iohexol Hives, Nausea And Vomiting and Swelling     Desc: Magnevist-gadolinium-difficulty breathing, throat swelling   . Midazolam Hcl Anaphylaxis    Difficulty breathing  . Shellfish Allergy Anaphylaxis  . Avandia [Rosiglitazone Maleate]     Patient doesn't remember reaction  . Geodon [Ziprasidone]   . Kiwi Extract Itching and Swelling  . Latex Itching  . Metformin And Related Nausea And Vomiting and Other (See Comments)    diarrhea  . Other Itching    Patient is allergic to all nuts except peanuts.     BP 106/62 mmHg  Pulse 78  Temp(Src) 98.1 F (36.7 C) (Oral)  Resp 22  Ht '5\' 6"'  (1.676 m)  Wt 238 lb 12.1 oz (108.3 kg)  BMI 38.55 kg/m2  SpO2 96%  LMP 02/27/2014  Ct Abdomen Pelvis Wo Contrast  03/06/2014   CLINICAL DATA:  Right lower quadrant abdominal pain  EXAM: CT ABDOMEN AND PELVIS WITHOUT CONTRAST  TECHNIQUE: Multidetector CT imaging of the abdomen and pelvis was performed following the standard protocol without IV contrast.  COMPARISON:  02/14/2014  FINDINGS: The fluid collection extending from the right lower quadrant peritoneal space, through the abdominal wall, and into the right lower quadrant subcutaneous fat measures 10.3 x 8.7 cm and previously measured 8.2 x 9.7 cm. It is slightly larger. The drain has  been removed. It is hernia mesh throughout the anterior abdominal wall including the right lower quadrant remains present.  Patchy opacities at both lung bases likely represent atelectasis  Liver, gallbladder, spleen, pancreas, kidneys, and adrenal glands are stable.  Uterus and adnexa are stable. Left ovary remains somewhat  prominent.  Free fluid in the pelvis has resolved.  IMPRESSION: Right lower quadrant fluid collection extending through the abdominal wall is slightly larger, today measuring 10.3 x 8.7 cm. The drain has been removed. Repeat drainage may be indicated.   Electronically Signed   By: Marybelle Killings M.D.   On: 03/06/2014 18:23   Ct Abdomen Pelvis Wo Contrast  02/14/2014   CLINICAL DATA:  44 year old with right abdominal wall seroma and percutaneous drainage catheter. History of abdominal hernia repair. Patient reports minimal output from the drain.  EXAM: CT ABDOMEN AND PELVIS WITHOUT CONTRAST  TECHNIQUE: Multidetector CT imaging of the abdomen and pelvis was performed following the standard protocol without IV contrast.  COMPARISON:  02/02/2014 and 01/30/2014  FINDINGS: Small focal peripheral density in the posterior right lower lobe on image 3 is nonspecific. Findings could represent focal volume loss. Again noted is a 4 mm nodule at the left lung base on sequence 3, image 4. This is unchanged since 07/30/2011 and compatible with a small benign nodule.  Again noted is a low-density collection involving the right lower abdominal wall which appears to extend into the anterior abdominal cavity. The subcutaneous component roughly measures 8.2 x 5.1 cm and previously measured 10.4 x 5.6 cm. The more intra-abdominal component measures roughly 7.5 x 3.1 cm and previously measured 8.3 x 3.1 cm. Again noted are inflammatory changes around the abdominal wall collections. The drain is along the lateral periphery of the cavity.  Small amount of new fluid posterior to the cervix and uterus. Again noted is a small amount of fluid within the abdominal wall along the anterior pelvis and adjacent to the surgical mesh material. Again noted are prominent ovarian tissue. The left ovarian tissue has slightly decreased in size compared to 01/30/2014. Uterine tissue is prominent with a calcified fibroid along the left posterior aspect.  No  acute abnormality involving the liver, gallbladder or spleen. Normal appearance of the pancreas, adrenal glands and right kidney. Again noted is a small fat attenuating lesion along the left kidney lower pole which could represent a small angiomyolipoma, measuring 9 mm, or related to a cortical scar. Again noted are areas of cortical scarring in the left kidney.  There are small lymph nodes along the retroperitoneum which have not significantly changed. No Coran Dipaola abnormality to the small or large bowel. No evidence for an obstructive bowel process.  No acute bone abnormality.  IMPRESSION: The fluid collection in the right lower quadrant subcutaneous tissue has decreased in size but there is still a large fluid component. The drain is along the lateral aspect of this collection which suggests a complex or loculated collection. The fluid collection along the right anterior abdominal cavity has slightly decreased in size. Small amount of new fluid in the pelvis may be physiologic.  Scarring in the left kidney and question a small angiomyolipoma in the left kidney lower pole.  Prominent ovarian tissue but minimal change from the previous examinations.  Drain Management: Ultrasound was performed of the right lower abdomen. Ultrasound suggested that the drain cavity was completely decompressed. There is an adjacent fluid collection which is not communicating with the drain. As a result, the catheter was removed. There was  some difficulty removing the suture from the pigtail catheter. This was removed by advancing a small micropuncture catheter over the suture. A new dressing was placed at the drain exit site.  Patient will be scheduled for a new drain placement in the residual right lower abdominal seroma with ultrasound guidance.   Electronically Signed   By: Markus Daft M.D.   On: 02/14/2014 14:08   Dg Chest Port 1 View  03/06/2014   CLINICAL DATA:  44 year old female with flank pain and diarrhea for 2 days. Initial  encounter.  EXAM: PORTABLE CHEST - 1 VIEW  COMPARISON:  10/07/2013 and earlier.  FINDINGS: Portable AP view at 1538 hours. Large body habitus. Mildly lower lung volumes. Normal cardiac size and mediastinal contours. Visualized tracheal air column is within normal limits. Allowing for portable technique, the lungs are clear. No pneumothorax or pneumoperitoneum.  IMPRESSION: No acute cardiopulmonary abnormality.   Electronically Signed   By: Genevie Ann M.D.   On: 03/06/2014 15:58   Ir Radiologist Eval & Mgmt  02/09/2014   EXAM: ESTABLISHED PATIENT OFFICE VISIT  CHIEF COMPLAINT: Leakage around drain catheter  Current Pain Level: 1-10  HISTORY OF PRESENT ILLNESS: Pt had abdominal seroma drain placed 02/02/14.  Has been taking care of it at home:  Calculating output, etc.  Noticed leakage near drain site at skin 1/26---called to inquire as to what to do.  She returns today (1/27) for evaluation of drain and site.  Denies fever or pain.  Denies change in color of output.  Output has diminished per pt.  Has not been flushing at home  PHYSICAL EXAMINATION: Afeb; In NAD  Site is clean and dry.  Only very small amount of leakage noted on gauze dressing: Yellow/serous color.  NT to touch site; no sign of infection  Sutures intact  Output in JP is serous - sl blood tinged  Flushed drain tubing with 10 cc sterile saline---initially met with mild resistance then flowed nicely.  Reflushed and aspirated with additional 10 cc sterile saline without issue.  Not painful to pt.  Placed new gauze dressing.  ASSESSMENT AND PLAN: Abdominal seroma drain tubing appeared to possibly be partially clogged with debris.  Flushing and aspirating well now.  Gave pt 10 cc flushes for home use: 1 flush per day---no aspiration at home.  She is scheduled for IR drain clinic 2/2 morning prior to re evaluation at Dr Johney Maine office later that morning.  She has good understanding of plan.  Read by:  Lavonia Drafts [facility]   Electronically Signed   By: Markus Daft M.D.   On: 02/09/2014 07:21    Note: This dictation was prepared with Dragon/digital dictation along with [employer]. Any transcriptional errors that result from this process are unintentional.

## 2014-03-10 LAB — STOOL CULTURE

## 2014-03-10 LAB — GLUCOSE, CAPILLARY: GLUCOSE-CAPILLARY: 228 mg/dL — AB (ref 70–99)

## 2014-03-11 LAB — BODY FLUID CULTURE
Culture: NO GROWTH
Special Requests: NORMAL

## 2014-03-12 LAB — CULTURE, BLOOD (ROUTINE X 2)
Culture: NO GROWTH
Culture: NO GROWTH

## 2014-03-13 ENCOUNTER — Ambulatory Visit: Payer: Federal, State, Local not specified - PPO | Admitting: Adult Health

## 2014-03-13 ENCOUNTER — Telehealth: Payer: Self-pay | Admitting: *Deleted

## 2014-03-13 NOTE — Telephone Encounter (Signed)
No show

## 2014-03-15 ENCOUNTER — Encounter: Payer: Self-pay | Admitting: Adult Health

## 2014-03-19 ENCOUNTER — Encounter (HOSPITAL_COMMUNITY): Payer: Self-pay | Admitting: Emergency Medicine

## 2014-03-19 ENCOUNTER — Emergency Department (HOSPITAL_COMMUNITY): Payer: Federal, State, Local not specified - PPO

## 2014-03-19 ENCOUNTER — Emergency Department (HOSPITAL_COMMUNITY)
Admission: EM | Admit: 2014-03-19 | Discharge: 2014-03-20 | Disposition: A | Discharge status: Home / Self Care | Payer: Federal, State, Local not specified - PPO | Attending: Emergency Medicine | Admitting: Emergency Medicine

## 2014-03-19 DIAGNOSIS — Z7982 Long term (current) use of aspirin: Secondary | ICD-10-CM | POA: Diagnosis not present

## 2014-03-19 DIAGNOSIS — Z9889 Other specified postprocedural states: Secondary | ICD-10-CM | POA: Insufficient documentation

## 2014-03-19 DIAGNOSIS — Y838 Other surgical procedures as the cause of abnormal reaction of the patient, or of later complication, without mention of misadventure at the time of the procedure: Secondary | ICD-10-CM | POA: Insufficient documentation

## 2014-03-19 DIAGNOSIS — F419 Anxiety disorder, unspecified: Secondary | ICD-10-CM | POA: Insufficient documentation

## 2014-03-19 DIAGNOSIS — F329 Major depressive disorder, single episode, unspecified: Secondary | ICD-10-CM | POA: Insufficient documentation

## 2014-03-19 DIAGNOSIS — Z862 Personal history of diseases of the blood and blood-forming organs and certain disorders involving the immune mechanism: Secondary | ICD-10-CM | POA: Diagnosis not present

## 2014-03-19 DIAGNOSIS — T814XXA Infection following a procedure, initial encounter: Secondary | ICD-10-CM | POA: Insufficient documentation

## 2014-03-19 DIAGNOSIS — Z8739 Personal history of other diseases of the musculoskeletal system and connective tissue: Secondary | ICD-10-CM | POA: Insufficient documentation

## 2014-03-19 DIAGNOSIS — Z8719 Personal history of other diseases of the digestive system: Secondary | ICD-10-CM | POA: Diagnosis not present

## 2014-03-19 DIAGNOSIS — Z91040 Latex allergy status: Secondary | ICD-10-CM | POA: Diagnosis not present

## 2014-03-19 DIAGNOSIS — L02211 Cutaneous abscess of abdominal wall: Secondary | ICD-10-CM | POA: Diagnosis not present

## 2014-03-19 DIAGNOSIS — I10 Essential (primary) hypertension: Secondary | ICD-10-CM | POA: Diagnosis not present

## 2014-03-19 DIAGNOSIS — Z794 Long term (current) use of insulin: Secondary | ICD-10-CM | POA: Insufficient documentation

## 2014-03-19 DIAGNOSIS — Z79899 Other long term (current) drug therapy: Secondary | ICD-10-CM | POA: Insufficient documentation

## 2014-03-19 DIAGNOSIS — E1140 Type 2 diabetes mellitus with diabetic neuropathy, unspecified: Secondary | ICD-10-CM | POA: Insufficient documentation

## 2014-03-19 DIAGNOSIS — Z3202 Encounter for pregnancy test, result negative: Secondary | ICD-10-CM | POA: Insufficient documentation

## 2014-03-19 DIAGNOSIS — G8929 Other chronic pain: Secondary | ICD-10-CM | POA: Insufficient documentation

## 2014-03-19 LAB — CBC WITH DIFFERENTIAL/PLATELET
Basophils Absolute: 0 10*3/uL (ref 0.0–0.1)
Basophils Relative: 0 % (ref 0–1)
EOS ABS: 0.3 10*3/uL (ref 0.0–0.7)
Eosinophils Relative: 3 % (ref 0–5)
HCT: 29.5 % — ABNORMAL LOW (ref 36.0–46.0)
HEMOGLOBIN: 9.6 g/dL — AB (ref 12.0–15.0)
Lymphocytes Relative: 13 % (ref 12–46)
Lymphs Abs: 1.5 10*3/uL (ref 0.7–4.0)
MCH: 26 pg (ref 26.0–34.0)
MCHC: 32.5 g/dL (ref 30.0–36.0)
MCV: 79.9 fL (ref 78.0–100.0)
MONOS PCT: 9 % (ref 3–12)
Monocytes Absolute: 1.1 10*3/uL — ABNORMAL HIGH (ref 0.1–1.0)
NEUTROS ABS: 8.7 10*3/uL — AB (ref 1.7–7.7)
NEUTROS PCT: 75 % (ref 43–77)
Platelets: 316 10*3/uL (ref 150–400)
RBC: 3.69 MIL/uL — AB (ref 3.87–5.11)
RDW: 14.6 % (ref 11.5–15.5)
WBC: 11.6 10*3/uL — ABNORMAL HIGH (ref 4.0–10.5)

## 2014-03-19 LAB — COMPREHENSIVE METABOLIC PANEL
ALBUMIN: 3 g/dL — AB (ref 3.5–5.2)
ALT: 15 U/L (ref 0–35)
AST: 14 U/L (ref 0–37)
Alkaline Phosphatase: 64 U/L (ref 39–117)
Anion gap: 5 (ref 5–15)
BUN: 19 mg/dL (ref 6–23)
CO2: 27 mmol/L (ref 19–32)
Calcium: 8.4 mg/dL (ref 8.4–10.5)
Chloride: 102 mmol/L (ref 96–112)
Creatinine, Ser: 1.45 mg/dL — ABNORMAL HIGH (ref 0.50–1.10)
GFR calc non Af Amer: 43 mL/min — ABNORMAL LOW (ref >90)
GFR, EST AFRICAN AMERICAN: 50 mL/min — AB (ref >90)
GLUCOSE: 395 mg/dL — AB (ref 70–99)
POTASSIUM: 4.4 mmol/L (ref 3.5–5.1)
Sodium: 134 mmol/L — ABNORMAL LOW (ref 135–145)
TOTAL PROTEIN: 6.5 g/dL (ref 6.0–8.3)
Total Bilirubin: 0.3 mg/dL (ref 0.3–1.2)

## 2014-03-19 LAB — URINE MICROSCOPIC-ADD ON

## 2014-03-19 LAB — URINALYSIS, ROUTINE W REFLEX MICROSCOPIC
Bilirubin Urine: NEGATIVE
Glucose, UA: >1000 mg/dL — AB
HGB URINE DIPSTICK: NEGATIVE
Ketones, ur: NEGATIVE mg/dL
Leukocytes, UA: NEGATIVE
NITRITE: NEGATIVE
PH: 5 (ref 5.0–8.0)
PROTEIN: NEGATIVE mg/dL
Specific Gravity, Urine: 1.023 (ref 1.005–1.030)
UROBILINOGEN UA: 0.2 mg/dL (ref 0.0–1.0)

## 2014-03-19 MED ORDER — HYDROMORPHONE HCL 1 MG/ML IJ SOLN
1.0000 mg | Freq: Once | INTRAMUSCULAR | Status: AC
  Administered 2014-03-19: 1 mg via INTRAVENOUS
  Filled 2014-03-19: qty 1

## 2014-03-19 MED ORDER — ONDANSETRON HCL 4 MG/2ML IJ SOLN
4.0000 mg | Freq: Once | INTRAMUSCULAR | Status: AC
  Administered 2014-03-19: 4 mg via INTRAVENOUS
  Filled 2014-03-19: qty 2

## 2014-03-19 NOTE — ED Notes (Signed)
EDP at bedside  

## 2014-03-19 NOTE — ED Notes (Signed)
Patient arrives with complaint of change in drainage from post op drain and drainage at site of drain entry. Patient explains that surgery was to repair a hernia in [place]. Drain was placed after 1 repair failed and second was attempted. Denies fever. States that drainage changed from red to yellow and now to brown. States mucous type drainage from entry site, and husband states drain tube appears to be pulled out some from where it was when inserted. Area is also painful now.

## 2014-03-19 NOTE — ED Notes (Signed)
Pt transported to radiology.

## 2014-03-19 NOTE — ED Provider Notes (Signed)
CSN: [id number]     Arrival date & time 03/19/14  1902 History   First MD Initiated Contact with Patient 03/19/14 2012     Chief Complaint  Patient presents with  . Post-op Problem     (Consider location/radiation/quality/duration/timing/severity/associated sxs/prior Treatment) HPI Comments: Patient presents to the ER for evaluation of pain in the right lower quadrant in the area where she has a known recurrent seroma from previous surgery. Patient was hospitalized several weeks ago for this and had a drain put in place. Patient reports that in the last couple of days she has noticed increased pain in the region. The drainage has changed to a yellowish and brown drainage from clear bloody fluid originally. She has not had any fever. No vomiting or diarrhea.   Past Medical History  Diagnosis Date  . Spastic hemiplegia affecting dominant side   . Panic disorder without agoraphobia   . Depression   . Anxiety   . Diabetes mellitus   . Hypertension   . Blood transfusion     IN 2012  AFTER C -SECTION  . Cerebral thrombosis with cerebral infarction JUNE 2011    RIGHT SIDED WEAKNESS ( ARM AND LEG ) AND SPASMS  . Stroke   . Right rotator cuff tear     PAIN IN RIGHT SHOULDER  . Ventral hernia     RIGHT LOWER QUADRANT-CAUSING SOME PAIN  . Anemia     DURING MENSES--HAS HEAVY BLEEDING WITH PERODS  . Headache(784.0)     MIGRAINES--NOT REALLY HEADACHE-MORE LIKE PRESSURE SENSATION IN HEAD-FEELS DIZZIY AND  FAINT AS THE PRESSURE RESOLVES  . Diabetic neuropathy     BOTH FEET --COMES AND GOES  . Restless leg syndrome     DIAGNOSED BY SLEEP STUDY - PT TOLD SHE DID NOT HAVE SLEEP APNEA  . Rash     HANDS, ARMS --STATES HX OF RASH EVER SINCE CHILDBIRTH/PREGNANCY.  STATES THE RASH OFTEN OCCURS WHEN SHE IS REALLY STRESSED.  Marland Kitchen Hernia, incisional, RLQ, s/p lap repair Sep 2013 09/02/2011  . SBO (small bowel obstruction) 06/03/2012  . Back pain, chronic   . Leg pain, right   . Hx of migraines 10/19/2011     Past Surgical History  Procedure Laterality Date  . Cesarean section  2012  . Uterine fibroid surgery      2 SURGERIES FOR FIBROIDS  . Ureter revision    . Ventral hernia repair  10/03/2011    Procedure: LAPAROSCOPIC VENTRAL HERNIA;  Surgeon: Adin Hector, MD;  Location: [facility];  Service: General;  Laterality: N/A;  . Hernia repair  10/03/11    ventral hernia repair  . Esophagogastroduodenoscopy N/A 06/03/2012    Procedure: ESOPHAGOGASTRODUODENOSCOPY (EGD);  Surgeon: [name] Craver, MD;  Location: [facility];  Service: Endoscopy;  Laterality: N/A;  . Diagnostic laparoscopy    . Umbilical hernia repair N/A 02/04/2013    Procedure: LAPAROSCOPIC ventral wall hernia repair LAPAROSCOPIC LYSIS OF ADHESIONS laparoscopic exploration of abdomen ;  Surgeon: Adin Hector, MD;  Location: [facility];  Service: General;  Laterality: N/A;  . Insertion of mesh N/A 02/04/2013    Procedure: INSERTION OF MESH;  Surgeon: Adin Hector, MD;  Location: [facility];  Service: General;  Laterality: N/A;   Family History  Problem Relation Age of Onset  . Diabetes Father   . Kidney disease Father   . Other Neg Hx   . Diabetes Maternal Grandmother   . Hyperlipidemia Paternal Grandmother   . Stroke Paternal Grandmother  History  Substance Use Topics  . Smoking status: Never Smoker   . Smokeless tobacco: Never Used  . Alcohol Use: No   OB History    Gravida Para Term Preterm AB TAB SAB Ectopic Multiple Living   1 1 0 1 0 0 0 0 0 1      Review of Systems  Gastrointestinal: Positive for abdominal pain.  Skin: Positive for wound.  All other systems reviewed and are negative.     Allergies  Contrast media; Iohexol; Midazolam hcl; Shellfish allergy; Metformin and related; Other; Avandia; Geodon; Kiwi extract; and Latex  Home Medications   Prior to Admission medications   Medication Sig Start Date End Date Taking? Authorizing Provider  acetaminophen-codeine (TYLENOL #2) 300-15 MG per tablet Take 1  tablet by mouth every 6 (six) hours as needed for moderate pain. 03/08/14  Yes Shanker Kristeen Mans, MD  allopurinol (ZYLOPRIM) 100 MG tablet Take 100 mg by mouth every evening. 03/13/14  Yes Historical Provider, MD  Amlodipine-Valsartan-HCTZ 25-053-97 MG TABS Take 1 tablet by mouth every evening.  01/09/13  Yes Historical Provider, MD  aspirin 325 MG tablet Take 325 mg by mouth at bedtime.    Yes Historical Provider, MD  Biotin 5000 MCG TABS Take 1 tablet by mouth daily.   Yes Historical Provider, MD  BYSTOLIC 10 MG tablet Take 10 mg by mouth every morning.  01/05/12  Yes Historical Provider, MD  colchicine 0.6 MG tablet Take 0.6 mg by mouth as needed. For gout flareup 02/14/14  Yes Historical Provider, MD  furosemide (LASIX) 40 MG tablet Take 20-40 mg by mouth daily as needed for fluid or edema.    Yes Historical Provider, MD  insulin glargine (LANTUS) 100 UNIT/ML injection Inject 10-15 Units into the skin 2 (two) times daily. 15 units in the morning and 10 mg in the evening   Yes Historical Provider, MD  INVOKANA 300 MG TABS tablet Take 300 mg by mouth daily. 12/17/13  Yes Historical Provider, MD  Liraglutide (VICTOZA) 18 MG/3ML SOPN Inject 1.2 mg into the skin daily.    Yes Historical Provider, MD  NOVOLOG FLEXPEN 100 UNIT/ML FlexPen Inject 5-12 Units into the skin 3 (three) times daily with meals.  12/14/13  Yes Historical Provider, MD  ondansetron (ZOFRAN) 4 MG tablet Take 1 tablet (4 mg total) by mouth every 6 (six) hours as needed for nausea. 03/08/14  Yes Shanker Kristeen Mans, MD  pregabalin (LYRICA) 75 MG capsule Take 75-150 mg by mouth 2 (two) times daily. Take 150 mg in the am, and 75 mg in the evening   Yes Historical Provider, MD  Prenatal Vit-Fe Fumarate-FA (PRENATAL MULTIVITAMIN) TABS tablet Take 1 tablet by mouth every evening.    Yes Historical Provider, MD  saccharomyces boulardii (FLORASTOR) 250 MG capsule Take 1 capsule (250 mg total) by mouth 2 (two) times daily. 03/08/14  Yes Shanker Kristeen Mans, MD  tiZANidine (ZANAFLEX) 4 MG tablet TAKE 1 TABLET (4 MG TOTAL) BY MOUTH 2 (TWO) TIMES DAILY. Patient taking differently: TAKE 1 TABLET (4 MG TOTAL) BY MOUTH AT BEDTIME 01/16/14  Yes [name], MD  traMADol (ULTRAM) 50 MG tablet Take 50 mg by mouth every morning. 03/01/14  Yes Historical Provider, MD   BP 144/64 mmHg  Pulse 95  Temp(Src) 99.3 F (37.4 C)  Resp 18  SpO2 97%  LMP 02/27/2014 Physical Exam  Constitutional: She is oriented to person, place, and time. She appears well-developed and well-nourished. No distress.  HENT:  Head: Normocephalic and atraumatic.  Right Ear: Hearing normal.  Left Ear: Hearing normal.  Nose: Nose normal.  Mouth/Throat: Oropharynx is clear and moist and mucous membranes are normal.  Eyes: Conjunctivae and EOM are normal. Pupils are equal, round, and reactive to light.  Neck: Normal range of motion. Neck supple.  Cardiovascular: Regular rhythm, S1 normal and S2 normal.  Exam reveals no gallop and no friction rub.   No murmur heard. Pulmonary/Chest: Effort normal and breath sounds normal. No respiratory distress. She exhibits no tenderness.  Abdominal: Soft. Normal appearance and bowel sounds are normal. There is no hepatosplenomegaly. There is tenderness in the right lower quadrant. There is no rebound, no guarding, no tenderness at McBurney's point and negative Murphy's sign. No hernia.    Musculoskeletal: Normal range of motion.  Neurological: She is alert and oriented to person, place, and time. She has normal strength. No cranial nerve deficit or sensory deficit. Coordination normal. GCS eye subscore is 4. GCS verbal subscore is 5. GCS motor subscore is 6.  Skin: Skin is warm, dry and intact. No rash noted. No cyanosis.  Psychiatric: She has a normal mood and affect. Her speech is normal and behavior is normal. Thought content normal.  Nursing note and vitals reviewed.   ED Course  Procedures (including critical care time) Labs  Review Labs Reviewed  COMPREHENSIVE METABOLIC PANEL - Abnormal; Notable for the following:    Sodium 134 (*)    Glucose, Bld 395 (*)    Creatinine, Ser 1.45 (*)    Albumin 3.0 (*)    GFR calc non Af Amer 43 (*)    GFR calc Af Amer 50 (*)    All other components within normal limits  CBC WITH DIFFERENTIAL/PLATELET - Abnormal; Notable for the following:    WBC 11.6 (*)    RBC 3.69 (*)    Hemoglobin 9.6 (*)    HCT 29.5 (*)    Neutro Abs 8.7 (*)    Monocytes Absolute 1.1 (*)    All other components within normal limits  URINALYSIS, ROUTINE W REFLEX MICROSCOPIC - Abnormal; Notable for the following:    APPearance CLOUDY (*)    Glucose, UA >1000 (*)    All other components within normal limits  URINE MICROSCOPIC-ADD ON  CBC WITH DIFFERENTIAL/PLATELET  URINALYSIS, ROUTINE W REFLEX MICROSCOPIC  POC URINE PREG, ED    Imaging Review Ct Abdomen Pelvis Wo Contrast  03/19/2014   CLINICAL DATA:  Abdominal wall abscess. Change in drainage from postoperative drain and drainage around the drain entry site and pain. History of right lower quadrant hernia repair.  EXAM: CT ABDOMEN AND PELVIS WITHOUT CONTRAST  TECHNIQUE: Multidetector CT imaging of the abdomen and pelvis was performed following the standard protocol without IV contrast.  COMPARISON:  Most recent CT 03/06/2014  FINDINGS: Mild dependent atelectasis at the lung bases.  Right lower quadrant anterior abdominal wall fluid collection currently measures 8.8 x 7.2 cm, previously 10.3 x 8.7 cm. This appears similar in appearance and extends through the anterior abdominal wall into the right lower quadrant peritoneal space. Drainage catheter is in place, no definite fluid along the course of the drainage catheter. Fluid collection remains heterogeneous with probable internal loculations. There is limited assessment of the subjacent bowel in the right lower abdomen given lack of oral contrast, however appears similar to that of prior exam. No definite  new fluid collection. There is associated soft tissue edema about the fluid collection in the anterior abdominal wall, the  soft tissue edema appears progressed from prior. Surgical mesh in the anterior abdominal wall is again seen and unchanged from prior.  Liver appears prominent with hepatic steatosis, more focal fatty infiltration adjacent to the falciform ligament. The unenhanced gallbladder, spleen, pancreas, and adrenal glands are unremarkable. The unenhanced kidneys are unchanged in appearance with probable scarring in the left kidney. There is no hydronephrosis.  The abdominal aorta is normal in caliber. Small retroperitoneal lymph nodes that appears similar in size compared to prior exam.  Uterus is enlarged and contains calcified posterior fundal fibroid. The left ovary remains prominent in size. Appendix is normal. Right ovary is grossly unchanged. There is small amount of free fluid in the cul-de-sac. The urinary bladder appears physiologically distended in abuts the anterior abdominal wall.  No acute osseous abnormalities.  IMPRESSION: 1. Slight decreased size of the right lower quadrant anterior abdominal fluid collection currently measuring 8.8 x 7.2 cm with drainage catheter in place. Fluid collection remains heterogeneous with probable internal loculations. There is increase in the surrounding soft tissue edema in the anterior abdominal wall which may reflect surrounding soft tissue inflammation/infection. 2. Small amount of free fluid in the pelvis appears similar to prior exam. There is no definite new fluid collection. 3. Uterine fibroids and prominence of the left ovary, this appears similar to prior exams.   Electronically Signed   By: Jeb Levering M.D.   On: 03/19/2014 23:29     EKG Interpretation None      MDM   Final diagnoses:  Abdominal wall abscess    Patient presents to the ER for evaluation of pain and change in drainage from the area in her right lower abdomen where  she has a percutaneous drain. Her records have been reviewed. Multiple cultures in the past have not grown any bacteria. This appears to be a sterile collection. I did perform a CT scan. She has a collection approximately 8.8 cm, similar in size to previous CTs. There appears to be heterogeneous fluid with internal loculations. Tip of the drain is in the appropriate location, loculations are likely keeping it from draining area completely, however.  Lab work is largely unremarkable. She had a white count of just above 11. She is not febrile here in the ER. She has been given analgesia and has improved. Case was discussed with Dr. Brantley Stage. He has reviewed the images. He felt that the patient could be discharged and follow-up with her surgeon, Dr. gross, tomorrow. She will be given increased analgesia, currently is using Tylenol with codeine. She was prescribed Percocet. Call Dr. gross in the morning, return for worsening symptoms or fever.    Orpah Greek, MD 03/20/14 Dyann Kief

## 2014-03-20 ENCOUNTER — Other Ambulatory Visit (HOSPITAL_COMMUNITY): Payer: Self-pay | Admitting: Interventional Radiology

## 2014-03-20 ENCOUNTER — Ambulatory Visit (HOSPITAL_COMMUNITY)
Admission: RE | Admit: 2014-03-20 | Discharge: 2014-03-20 | Disposition: A | Discharge status: Home / Self Care | Payer: Federal, State, Local not specified - PPO | Source: Ambulatory Visit | Attending: Interventional Radiology | Admitting: Interventional Radiology

## 2014-03-20 DIAGNOSIS — K9421 Gastrostomy hemorrhage: Secondary | ICD-10-CM | POA: Diagnosis not present

## 2014-03-20 DIAGNOSIS — Y848 Other medical procedures as the cause of abnormal reaction of the patient, or of later complication, without mention of misadventure at the time of the procedure: Secondary | ICD-10-CM | POA: Diagnosis not present

## 2014-03-20 DIAGNOSIS — T859XXA Unspecified complication of internal prosthetic device, implant and graft, initial encounter: Secondary | ICD-10-CM | POA: Insufficient documentation

## 2014-03-20 DIAGNOSIS — K651 Peritoneal abscess: Secondary | ICD-10-CM | POA: Diagnosis not present

## 2014-03-20 DIAGNOSIS — IMO0002 Reserved for concepts with insufficient information to code with codable children: Secondary | ICD-10-CM

## 2014-03-20 LAB — POC URINE PREG, ED: Preg Test, Ur: NEGATIVE

## 2014-03-20 MED ORDER — LIDOCAINE HCL 1 % IJ SOLN
INTRAMUSCULAR | Status: AC
  Filled 2014-03-20: qty 20

## 2014-03-20 MED ORDER — MEPERIDINE HCL 50 MG/ML IJ SOLN
INTRAMUSCULAR | Status: AC
  Administered 2014-03-20: 50 mg via INTRAMUSCULAR
  Filled 2014-03-20: qty 1

## 2014-03-20 MED ORDER — OXYCODONE-ACETAMINOPHEN 5-325 MG PO TABS: 1.0000 | ORAL_TABLET | ORAL | Status: DC | PRN

## 2014-03-20 MED ORDER — MEPERIDINE HCL 50 MG/ML IJ SOLN
50.0000 mg | Freq: Once | INTRAMUSCULAR | Status: AC
  Administered 2014-03-20: 50 mg via INTRAMUSCULAR

## 2014-03-20 NOTE — Discharge Instructions (Signed)
Seroma A seroma is a collection of fluid that looks like swelling or a mass on the body. Seromas form on the body where tissue has been injured or cut. They are most common after surgeries. Seromas vary in size. Some are small and painless. Others may become large and cause pain or discomfort. Many seromas go away on their own; the fluid is naturally absorbed by the body. Some may require the fluid to be drained through medical procedures.  CAUSES  Seromas form as the result of damage to tissue or the removal of tissue. This tissue damage may occur during surgery or because of an injury or trauma. When tissue is disrupted or removed, empty space is created. The body's natural defense system causes fluid to enter the empty space and form a seroma. SYMPTOMS   Swelling at the site of a surgical cut (incision) or an injury.  Drainage of clear fluid at the surgery or injury site.  Possible discomfort or pain. DIAGNOSIS  Your health care provider will perform a physical exam. During the exam, the health care provider will press on the seroma using a hand or fingers (palpation). Various tests may be ordered to help confirm the diagnosis. These tests may include:  Blood tests.  Imaging tests such as ultrasonography or computed tomography (CT). TREATMENT  Sometimes seromas resolve on their own and drain naturally in the body. Your health care provider may monitor you to make sure the seroma does not cause any complications. If your seroma does not resolve on its own, treatment may include:  Using a needle to drain the fluid from the seroma (needle aspiration).  Inserting a flexible tube (catheter) to drain the fluid.  Applying a dressing, such as an elastic bandage or binder.  Use of antibiotic medicines if the seroma becomes infected.  In rare cases, surgery may be done to remove the seroma and repair the area. HOME CARE INSTRUCTIONS  Follow your health care provider's instructions regarding  activity levels and any limitations on movements.  Only take over-the-counter or prescription medicines as directed by your health care provider.  If your health care provider prescribes antibiotics, take them as directed. Finish them even if you start to feel better.  Check your seroma every day for redness, warmth, or yellow drainage.  Follow up with your health care provider as directed. SEEK MEDICAL CARE IF:  You develop a fever.  You have pain, tenderness, redness, or warmth at the site of the seroma.  You notice yellow drainage coming from the site of the seroma.  Your seroma is getting bigger. Document Released: 04/26/2012 Document Revised: 05/16/2013 Document Reviewed: 04/26/2012 [employer] Patient Information 2015 [employer]. This information is not intended to replace advice given to you by your health care provider. Make sure you discuss any questions you have with your health care provider.  

## 2014-03-20 NOTE — Procedures (Signed)
RLQ 12 Fr drain exchange No comp

## 2014-03-23 DIAGNOSIS — M10071 Idiopathic gout, right ankle and foot: Secondary | ICD-10-CM | POA: Diagnosis not present

## 2014-03-23 DIAGNOSIS — T792XXS Traumatic secondary and recurrent hemorrhage and seroma, sequela: Secondary | ICD-10-CM | POA: Diagnosis not present

## 2014-03-23 DIAGNOSIS — R1031 Right lower quadrant pain: Secondary | ICD-10-CM | POA: Diagnosis not present

## 2014-03-23 DIAGNOSIS — G8929 Other chronic pain: Secondary | ICD-10-CM | POA: Diagnosis not present

## 2014-03-23 DIAGNOSIS — T888XXS Other specified complications of surgical and medical care, not elsewhere classified, sequela: Secondary | ICD-10-CM | POA: Diagnosis not present

## 2014-03-23 DIAGNOSIS — M25561 Pain in right knee: Secondary | ICD-10-CM | POA: Diagnosis not present

## 2014-04-04 DIAGNOSIS — M25561 Pain in right knee: Secondary | ICD-10-CM | POA: Diagnosis not present

## 2014-04-10 ENCOUNTER — Other Ambulatory Visit (HOSPITAL_COMMUNITY): Payer: Self-pay | Admitting: Interventional Radiology

## 2014-04-10 DIAGNOSIS — L0291 Cutaneous abscess, unspecified: Secondary | ICD-10-CM

## 2014-04-10 DIAGNOSIS — S301XXD Contusion of abdominal wall, subsequent encounter: Secondary | ICD-10-CM

## 2014-04-10 DIAGNOSIS — IMO0002 Reserved for concepts with insufficient information to code with codable children: Secondary | ICD-10-CM

## 2014-04-12 ENCOUNTER — Ambulatory Visit (INDEPENDENT_AMBULATORY_CARE_PROVIDER_SITE_OTHER): Payer: Federal, State, Local not specified - PPO | Admitting: Adult Health

## 2014-04-12 ENCOUNTER — Encounter: Payer: Self-pay | Admitting: Adult Health

## 2014-04-12 VITALS — HR 76 | Ht 66.5 in | Wt 244.2 lb

## 2014-04-12 DIAGNOSIS — G44221 Chronic tension-type headache, intractable: Secondary | ICD-10-CM

## 2014-04-12 DIAGNOSIS — Z8673 Personal history of transient ischemic attack (TIA), and cerebral infarction without residual deficits: Secondary | ICD-10-CM | POA: Diagnosis not present

## 2014-04-12 NOTE — Progress Notes (Signed)
PATIENT: Tina [name] DOB: 1970-01-27  REASON FOR VISIT: follow up- stroke, headache HISTORY FROM: patient  HISTORY OF PRESENT ILLNESS: Tina [name] is a 44 year old female with a history of stroke and tension-type headaches. She returns today for follow-up. She continues taking aspirin for stroke prevention. She states her blood pressure has been well controlled. She states she recently had blood work with her primary care. Unsure what her cholesterol was. She states her hemoglobin A1c has recently been elevated but this is due to some complications with a hernia repair. He states that she's had several surgeries for hernia complications. She recently developed a fluid pocket as well as an infection and had repair on antibiotics. She states her blood sugars are finally starting to get back to normal. The patient states that she continues to have tension type headaches. She will have approximately 3 a week. She states that the pressure sensation feels like a balloon blown up in her head and then releasing. She states that this can last for several minutes and then resolves. She states that she will usually have 1-2 headaches that involve sharp pain behind the left eye. She states this will call some photophobia and she'll have to delay in a dark room to get rid of the headache. He denies any strokelike symptoms. Although she does state when she had her surgery she did develop right-sided weakness after both surgeries and had to participate in physical therapy. She returns today for an evaluation.  HISTORY 09/01/13 (SETHI): 15 year [place] American lady with remote history of left pontine infarct in June 2011 due to small vessel disease with multiple vascular risk for hypertension, hyperlipidemia, diabetes and obesity.  She is seen today for followup her last visit in our office 2 years ago. She is concerned today about new headaches that she's had for the last 8 months or so following hernia  surgery. She describes this involving the whole head sensation of the head feeling blown up with a burst like sensation with the pressure released feeling. After the headache is gone she feels lightheaded and dizzy and nearly passes out. This last only a few minutes. This was initially infrequent but for the last several weeks this has been happening on a daily basis. There is no right complain nausea vomiting sound sensitivity. She does not take any medications for this. She does however take tramadol for chronic right shoulder pain and Zanaflex for muscle spasms in her feet. She does admit to increased stress in her life recently. She has remote history of left pontine infarct and 2011 and mild right-sided weakness from that for which she has recovered very well. She is still taking aspirin regularly and talking and well. She states her blood pressure control is good and today it is 122/65. Her diabetes control is not adequate and last HbA1c was 8.3. Lipid profile checked a month ago was fine as per her primary physician dr Wilson Singer.   REVIEW OF SYSTEMS: Out of a complete 14 system review of symptoms, the patient complains only of the following symptoms, and all other reviewed systems are negative.  Excessive sweating, ringing in ears, runny nose, eye itching, eye pain, abdominal pain, constipation, diarrhea, nausea, vomiting, frequent waking, daytime sleepiness, back pain, muscle cramps, rash, itching, dizziness, headache, confusion, depression, nervous/anxious  ALLERGIES: Allergies  Allergen Reactions  . Contrast Media [Iodinated Diagnostic Agents] Other (See Comments)    Difficulty breathing  . Iohexol Hives, Nausea And Vomiting and Swelling  Desc: Magnevist-gadolinium-difficulty breathing, throat swelling   . Midazolam Hcl Anaphylaxis    Difficulty breathing  . Shellfish Allergy Anaphylaxis  . Metformin And Related Diarrhea and Nausea And Vomiting  . Other Itching    Patient is allergic to  all nuts except peanuts.   Donalda Ewings [Rosiglitazone Maleate] Other (See Comments)    Patient doesn't remember reaction  . Geodon [Ziprasidone] Other (See Comments)    UNKNOWN  . Kiwi Extract Itching and Swelling  . Latex Itching    HOME MEDICATIONS: Outpatient Prescriptions Prior to Visit  Medication Sig Dispense Refill  . allopurinol (ZYLOPRIM) 100 MG tablet Take 100 mg by mouth every evening.    . Amlodipine-Valsartan-HCTZ 10-320-25 MG TABS Take 1 tablet by mouth every evening.     Marland Kitchen aspirin 325 MG tablet Take 325 mg by mouth at bedtime.     . Biotin 5000 MCG TABS Take 1 tablet by mouth daily.    Marland Kitchen BYSTOLIC 10 MG tablet Take 10 mg by mouth every morning.     . colchicine 0.6 MG tablet Take 0.6 mg by mouth as needed. For gout flareup  1  . furosemide (LASIX) 40 MG tablet Take 20-40 mg by mouth daily as needed for fluid or edema.     . insulin glargine (LANTUS) 100 UNIT/ML injection Inject 10-15 Units into the skin 2 (two) times daily. 15 units in the morning and 10 mg in the evening    . Liraglutide (VICTOZA) 18 MG/3ML SOPN Inject 1.2 mg into the skin daily.     Marland Kitchen NOVOLOG FLEXPEN 100 UNIT/ML FlexPen Inject 5-12 Units into the skin 3 (three) times daily with meals.   99  . ondansetron (ZOFRAN) 4 MG tablet Take 1 tablet (4 mg total) by mouth every 6 (six) hours as needed for nausea. 20 tablet 0  . pregabalin (LYRICA) 75 MG capsule Take 75-150 mg by mouth 2 (two) times daily. Take 150 mg in the am, and 75 mg in the evening    . Prenatal Vit-Fe Fumarate-FA (PRENATAL MULTIVITAMIN) TABS tablet Take 1 tablet by mouth every evening.     . saccharomyces boulardii (FLORASTOR) 250 MG capsule Take 1 capsule (250 mg total) by mouth 2 (two) times daily. 30 capsule 0  . tiZANidine (ZANAFLEX) 4 MG tablet TAKE 1 TABLET (4 MG TOTAL) BY MOUTH 2 (TWO) TIMES DAILY. (Patient taking differently: TAKE 1 TABLET (4 MG TOTAL) BY MOUTH AT BEDTIME) 60 tablet 1  . traMADol (ULTRAM) 50 MG tablet Take 50 mg by mouth  every morning.    Marland Kitchen acetaminophen-codeine (TYLENOL #2) 300-15 MG per tablet Take 1 tablet by mouth every 6 (six) hours as needed for moderate pain. (Patient not taking: Reported on 04/12/2014) 30 tablet 0  . INVOKANA 300 MG TABS tablet Take 300 mg by mouth daily.  2  . oxyCODONE-acetaminophen (PERCOCET) 5-325 MG per tablet Take 1-2 tablets by mouth every 4 (four) hours as needed. 20 tablet 0   No facility-administered medications prior to visit.    PAST MEDICAL HISTORY: Past Medical History  Diagnosis Date  . Spastic hemiplegia affecting dominant side   . Panic disorder without agoraphobia   . Depression   . Anxiety   . Diabetes mellitus   . Hypertension   . Blood transfusion     IN 2012  AFTER C -SECTION  . Cerebral thrombosis with cerebral infarction JUNE 2011    RIGHT SIDED WEAKNESS ( ARM AND LEG ) AND SPASMS  . Stroke   .  Right rotator cuff tear     PAIN IN RIGHT SHOULDER  . Ventral hernia     RIGHT LOWER QUADRANT-CAUSING SOME PAIN  . Anemia     DURING MENSES--HAS HEAVY BLEEDING WITH PERODS  . Headache(784.0)     MIGRAINES--NOT REALLY HEADACHE-MORE LIKE PRESSURE SENSATION IN HEAD-FEELS DIZZIY AND  FAINT AS THE PRESSURE RESOLVES  . Diabetic neuropathy     BOTH FEET --COMES AND GOES  . Restless leg syndrome     DIAGNOSED BY SLEEP STUDY - PT TOLD SHE DID NOT HAVE SLEEP APNEA  . Rash     HANDS, ARMS --STATES HX OF RASH EVER SINCE CHILDBIRTH/PREGNANCY.  STATES THE RASH OFTEN OCCURS WHEN SHE IS REALLY STRESSED.  Marland Kitchen Hernia, incisional, RLQ, s/p lap repair Sep 2013 09/02/2011  . SBO (small bowel obstruction) 06/03/2012  . Back pain, chronic   . Leg pain, right   . Hx of migraines 10/19/2011    PAST SURGICAL HISTORY: Past Surgical History  Procedure Laterality Date  . Cesarean section  2012  . Uterine fibroid surgery      2 SURGERIES FOR FIBROIDS  . Ureter revision    . Ventral hernia repair  10/03/2011    Procedure: LAPAROSCOPIC VENTRAL HERNIA;  Surgeon: Adin Hector, MD;   Location: [facility];  Service: General;  Laterality: N/A;  . Hernia repair  10/03/11    ventral hernia repair  . Esophagogastroduodenoscopy N/A 06/03/2012    Procedure: ESOPHAGOGASTRODUODENOSCOPY (EGD);  Surgeon: [name] Craver, MD;  Location: [facility] ENDOSCOPY;  Service: Endoscopy;  Laterality: N/A;  . Diagnostic laparoscopy    . Umbilical hernia repair N/A 02/04/2013    Procedure: LAPAROSCOPIC ventral wall hernia repair LAPAROSCOPIC LYSIS OF ADHESIONS laparoscopic exploration of abdomen ;  Surgeon: Adin Hector, MD;  Location: [facility];  Service: General;  Laterality: N/A;  . Insertion of mesh N/A 02/04/2013    Procedure: INSERTION OF MESH;  Surgeon: Adin Hector, MD;  Location: [facility];  Service: General;  Laterality: N/A;    FAMILY HISTORY: Family History  Problem Relation Age of Onset  . Diabetes Father   . Kidney disease Father   . Other Neg Hx   . Diabetes Maternal Grandmother   . Hyperlipidemia Paternal Grandmother   . Stroke Paternal Grandmother     SOCIAL HISTORY: History   Social History  . Marital Status: Married    Spouse Name: N/A  . Number of Children: 2  . Years of Education: BA degree   Occupational History  .     Social History Main Topics  . Smoking status: Never Smoker   . Smokeless tobacco: Never Used  . Alcohol Use: No  . Drug Use: No  . Sexual Activity: Yes    Birth Control/ Protection: None   Other Topics Concern  . Not on file   Social History Narrative   Patient is married with 2 children.   Patient is right handed.   Patient has her  BA degree.   Patient drinks 1 cup daily.      PHYSICAL EXAM  Filed Vitals:   04/12/14 1110  BP: 120/76  Pulse: 76  Height: 5' 6.5" (1.689 m)  Weight: 244 lb 3.2 oz (110.768 kg)   Body mass index is 38.83 kg/(m^2).  Generalized: Well developed, in no acute distress   Neurological examination  Mentation: Alert oriented to time, place, history taking. Follows all commands speech and language fluent Cranial  nerve II-XII: Pupils were equal round reactive to light.  Extraocular movements were full, visual field were full on confrontational test. Facial sensation and strength were normal. Uvula tongue midline. Head turning and shoulder shrug  were normal and symmetric. Motor: The motor testing reveals 5 over 5 strength of all 4 extremities. Good symmetric motor tone is noted throughout.  Sensory: Sensory testing is intact to soft touch on all 4 extremities. No evidence of extinction is noted.  Coordination: Cerebellar testing reveals good finger-nose-finger and heel-to-shin bilaterally.  Gait and station: Gait is normal. Tandem gait is normal. Romberg is negative. No drift is seen.  Reflexes: Deep tendon reflexes hyperreflexive on the right.    DIAGNOSTIC DATA (LABS, IMAGING, TESTING) - I reviewed patient records, labs, notes, testing and imaging myself where available.  Lab Results  Component Value Date   WBC 11.6* 03/19/2014   HGB 9.6* 03/19/2014   HCT 29.5* 03/19/2014   MCV 79.9 03/19/2014   PLT 316 03/19/2014      Component Value Date/Time   NA 134* 03/19/2014 1951   K 4.4 03/19/2014 1951   CL 102 03/19/2014 1951   CO2 27 03/19/2014 1951   GLUCOSE 395* 03/19/2014 1951   BUN 19 03/19/2014 1951   CREATININE 1.45* 03/19/2014 1951   CALCIUM 8.4 03/19/2014 1951   PROT 6.5 03/19/2014 1951   ALBUMIN 3.0* 03/19/2014 1951   AST 14 03/19/2014 1951   ALT 15 03/19/2014 1951   ALKPHOS 64 03/19/2014 1951   BILITOT 0.3 03/19/2014 1951   GFRNONAA 43* 03/19/2014 1951   GFRAA 50* 03/19/2014 1951   Lab Results  Component Value Date   CHOL 168 10/17/2011   HDL 28* 10/17/2011   LDLCALC 81 10/17/2011   TRIG 294* 10/17/2011   CHOLHDL 6.0 10/17/2011   Lab Results  Component Value Date   HGBA1C 8.7* 02/05/2013   Lab Results  Component Value Date   VITAMINB12 289 06/16/2009   Lab Results  Component Value Date   TSH 1.045 06/03/2012      ASSESSMENT AND PLAN 44 y.o. year old female   has a past medical history of Spastic hemiplegia affecting dominant side; Panic disorder without agoraphobia; Depression; Anxiety; Diabetes mellitus; Hypertension; Blood transfusion; Cerebral thrombosis with cerebral infarction (JUNE 2011); Stroke; Right rotator cuff tear; Ventral hernia; Anemia; Headache(784.0); Diabetic neuropathy; Restless leg syndrome; Rash; Hernia, incisional, RLQ, s/p lap repair Sep 2013 (09/02/2011); SBO (small bowel obstruction) (06/03/2012); Back pain, chronic; Leg pain, right; and migraines (10/19/2011). here with:  1. History of stroke 2. Tension headaches  The patient will continue aspirin for stroke prevention with strict control of hypertension with blood pressure goal below 130/90 and diabetes with hemoglobin A1c go to 6.5%. At this time the patient does not want to start daily medication for her headaches. She will try to participate in relaxation classes such as yoga. She will continue using tramadol as needed for her headaches. Patient advised that if her headaches worsen she should let us know. At that time we'll have to consider starting a preventative medication. She will follow-up in 6 months or sooner if needed.    [name], MSN, NP-C 04/12/2014, 11:29 AM [facility] [address] [place], [place] [address] [phone]  Note: This document was prepared with digital dictation and possible smart phrase technology. Any transcriptional errors that result from this process are unintentional.

## 2014-04-12 NOTE — Patient Instructions (Signed)
Continue aspirin for stroke prevention with strict control of hypertension with blood pressure goal below 130/90 and diabetes with hemoglobin A1c go to 6.5%. If your headaches worsen let us know.

## 2014-04-12 NOTE — Progress Notes (Signed)
I agree with the assessment and plan as directed by NP .The patient is known to me .   Larene Ascencio, MD  

## 2014-04-17 NOTE — Progress Notes (Signed)
I agree with the above plan 

## 2014-04-19 DIAGNOSIS — T792XXS Traumatic secondary and recurrent hemorrhage and seroma, sequela: Secondary | ICD-10-CM | POA: Diagnosis not present

## 2014-04-19 DIAGNOSIS — T888XXS Other specified complications of surgical and medical care, not elsewhere classified, sequela: Secondary | ICD-10-CM | POA: Diagnosis not present

## 2014-04-19 DIAGNOSIS — R1031 Right lower quadrant pain: Secondary | ICD-10-CM | POA: Diagnosis not present

## 2014-04-19 DIAGNOSIS — G8929 Other chronic pain: Secondary | ICD-10-CM | POA: Diagnosis not present

## 2014-04-25 DIAGNOSIS — L0390 Cellulitis, unspecified: Secondary | ICD-10-CM | POA: Diagnosis not present

## 2014-04-25 DIAGNOSIS — R109 Unspecified abdominal pain: Secondary | ICD-10-CM | POA: Diagnosis not present

## 2014-04-25 DIAGNOSIS — E118 Type 2 diabetes mellitus with unspecified complications: Secondary | ICD-10-CM | POA: Diagnosis not present

## 2014-05-02 DIAGNOSIS — M25551 Pain in right hip: Secondary | ICD-10-CM | POA: Diagnosis not present

## 2014-05-30 ENCOUNTER — Encounter: Payer: Federal, State, Local not specified - PPO | Attending: Physical Medicine & Rehabilitation

## 2014-05-30 ENCOUNTER — Ambulatory Visit (HOSPITAL_BASED_OUTPATIENT_CLINIC_OR_DEPARTMENT_OTHER): Payer: Federal, State, Local not specified - PPO | Admitting: Physical Medicine & Rehabilitation

## 2014-05-30 ENCOUNTER — Encounter: Payer: Self-pay | Admitting: Physical Medicine & Rehabilitation

## 2014-05-30 VITALS — BP 154/90 | HR 90 | Resp 14

## 2014-05-30 DIAGNOSIS — Z8673 Personal history of transient ischemic attack (TIA), and cerebral infarction without residual deficits: Secondary | ICD-10-CM

## 2014-05-30 DIAGNOSIS — G8110 Spastic hemiplegia affecting unspecified side: Secondary | ICD-10-CM | POA: Diagnosis not present

## 2014-05-30 DIAGNOSIS — M75101 Unspecified rotator cuff tear or rupture of right shoulder, not specified as traumatic: Secondary | ICD-10-CM | POA: Diagnosis not present

## 2014-05-30 DIAGNOSIS — E669 Obesity, unspecified: Secondary | ICD-10-CM | POA: Diagnosis not present

## 2014-05-30 MED ORDER — TRAMADOL HCL 50 MG PO TABS: 50.0000 mg | ORAL_TABLET | Freq: Two times a day (BID) | ORAL | Status: DC | PRN

## 2014-05-30 MED ORDER — GABAPENTIN 300 MG PO CAPS: 300.0000 mg | ORAL_CAPSULE | Freq: Three times a day (TID) | ORAL | Status: DC

## 2014-05-30 NOTE — Progress Notes (Signed)
Subjective:    Patient ID: Tina [name], female    DOB: 11-25-1970, 44 y.o.   MRN: [id number]  HPI   Last visit was in August 2015. Since that time has had problems with ventral hernia with infection and multiple drainage procedures. This is starting to improve once again. Also has had some multiple cramping pain in the back of the thighs back of the knee and the bottom of the foot. This was after her endocrinologist switched her from gabapentin to Lyrica. Patient also experienced weight gain on Lyrica. Patient stopped her Lyrica and cramping symptoms improved Patient restarted her gabapentin which helped with charley horse sensation in calves and posterior thigh, now is out of her gabapentin. Right shoulder pain is overall improved. Patient is independent with her mobility. She still requires a shower chair because of loss of balance and shower. She also has difficulty with sweeping and mopping but can do other light housework.  Fell 3 times in lst 6 mo, RLE gives way with weakness No severe injury from the falls  Patient was also seen by orthopedics sometime in the last 9 months. Some fluid was drawn off the right knee. Also x-rays were done of the right knee as well as perhaps the hip. Patient reports some arthritis in those areas  Patient also diagnosed with gout in the right toe, currently not flared up, Now on allopurinol, this was started by primary care doctor Pain Inventory Average Pain 7 Pain Right Now 8 My pain is constant, sharp, burning, stabbing and tingling  In the last 24 hours, has pain interfered with the following? General activity 9 Relation with others 9 Enjoyment of life 9 What TIME of day is your pain at its worst? evening and night Sleep (in general) Poor  Pain is worse with: walking, standing and some activites Pain improves with:  medications Relief from Meds: 6  Mobility walk without assistance how many minutes can you walk? 15 ability to climb  steps?  yes do you drive?  yes  Function disabled: date disabled .  Neuro/Psych numbness tingling spasms dizziness confusion anxiety  Prior Studies Any changes since last visit?  no  Physicians involved in your care Any changes since last visit?  no   Family History  Problem Relation Age of Onset  . Diabetes Father   . Kidney disease Father   . Other Neg Hx   . Diabetes Maternal Grandmother   . Hyperlipidemia Paternal Grandmother   . Stroke Paternal Grandmother    History   Social History  . Marital Status: Married    Spouse Name: N/A  . Number of Children: 2  . Years of Education: BA degree   Occupational History  .     Social History Main Topics  . Smoking status: Never Smoker   . Smokeless tobacco: Never Used  . Alcohol Use: No  . Drug Use: No  . Sexual Activity: Yes    Birth Control/ Protection: None   Other Topics Concern  . None   Social History Narrative   Patient is married with 2 children.   Patient is right handed.   Patient has her  BA degree.   Patient drinks 1 cup daily.   Past Surgical History  Procedure Laterality Date  . Cesarean section  2012  . Uterine fibroid surgery      2 SURGERIES FOR FIBROIDS  . Ureter revision    . Ventral hernia repair  10/03/2011    Procedure: LAPAROSCOPIC  VENTRAL HERNIA;  Surgeon: Adin Hector, MD;  Location: [facility];  Service: General;  Laterality: N/A;  . Hernia repair  10/03/11    ventral hernia repair  . Esophagogastroduodenoscopy N/A 06/03/2012    Procedure: ESOPHAGOGASTRODUODENOSCOPY (EGD);  Surgeon: [name] Craver, MD;  Location: [facility] ENDOSCOPY;  Service: Endoscopy;  Laterality: N/A;  . Diagnostic laparoscopy    . Umbilical hernia repair N/A 02/04/2013    Procedure: LAPAROSCOPIC ventral wall hernia repair LAPAROSCOPIC LYSIS OF ADHESIONS laparoscopic exploration of abdomen ;  Surgeon: Adin Hector, MD;  Location: [facility];  Service: General;  Laterality: N/A;  . Insertion of mesh N/A 02/04/2013     Procedure: INSERTION OF MESH;  Surgeon: Adin Hector, MD;  Location: [facility];  Service: General;  Laterality: N/A;   Past Medical History  Diagnosis Date  . Spastic hemiplegia affecting dominant side   . Panic disorder without agoraphobia   . Depression   . Anxiety   . Diabetes mellitus   . Hypertension   . Blood transfusion     IN 2012  AFTER C -SECTION  . Cerebral thrombosis with cerebral infarction JUNE 2011    RIGHT SIDED WEAKNESS ( ARM AND LEG ) AND SPASMS  . Stroke   . Right rotator cuff tear     PAIN IN RIGHT SHOULDER  . Ventral hernia     RIGHT LOWER QUADRANT-CAUSING SOME PAIN  . Anemia     DURING MENSES--HAS HEAVY BLEEDING WITH PERODS  . Headache(784.0)     MIGRAINES--NOT REALLY HEADACHE-MORE LIKE PRESSURE SENSATION IN HEAD-FEELS DIZZIY AND  FAINT AS THE PRESSURE RESOLVES  . Diabetic neuropathy     BOTH FEET --COMES AND GOES  . Restless leg syndrome     DIAGNOSED BY SLEEP STUDY - PT TOLD SHE DID NOT HAVE SLEEP APNEA  . Rash     HANDS, ARMS --STATES HX OF RASH EVER SINCE CHILDBIRTH/PREGNANCY.  STATES THE RASH OFTEN OCCURS WHEN SHE IS REALLY STRESSED.  Marland Kitchen Hernia, incisional, RLQ, s/p lap repair Sep 2013 09/02/2011  . SBO (small bowel obstruction) 06/03/2012  . Back pain, chronic   . Leg pain, right   . Hx of migraines 10/19/2011   BP 154/90 mmHg  Pulse 90  Resp 14  SpO2 98%  Opioid Risk Score:   Fall Risk Score: Low Fall Risk (0-5 points)`1  Depression screen PHQ 2/9  No flowsheet data found.    Review of Systems  Constitutional:       High blood sugar - 185 today Night sweats, weight gain   HENT: Negative.   Eyes: Negative.   Respiratory: Negative.   Cardiovascular: Negative.   Gastrointestinal: Positive for nausea, abdominal pain and diarrhea.  Endocrine: Negative.   Genitourinary: Negative.   Musculoskeletal: Positive for myalgias, back pain and arthralgias.  Skin: Negative.   Allergic/Immunologic: Negative.   Neurological: Positive for dizziness  and numbness.       Tingling, spasms  Hematological: Negative.   Psychiatric/Behavioral: Positive for confusion. The patient is nervous/anxious.   All other systems reviewed and are negative.      Objective:   Physical Exam  Ambulates with wide base of support No assisted device No total grade or knee instability noted.1. Decreased sensation to cold as well as light touch on the right foot compared to the left foot Sensation is equal bilateral upper extremities Motor strength 4/5 in the right deltoid, biceps, triceps, grip, hip flexor, knee extensor, 3 minus ankle dorsiflexor 5/5 in the left deltoid,  biceps, triceps, grip, hip flexor, knee extensor, ankle dorsiflexor plantar  Negative straight leg raise except for some pulling sensation in the back of the knee and ankle  No pain with right shoulder range of motion    Assessment & Plan:  1. Right hemiparesis secondary to CVA, Deficits are stable, chronic problems with balance. Continue stroke prevention, continue using assistive devices as needed, continue stretching especially at the right ankle  2. Post stroke shoulder pain improved, continue stretching exercises for the right shoulder  3. Neuropathy related pain secondary to diabetes, continue gabapentin will restart 300 mg 3 times per day with refills  4. Multifactorial pain mainly stroke related however has had some hernia related pain recently as well. Will restart tramadol 50 mg twice a day

## 2014-05-30 NOTE — Patient Instructions (Signed)
Cont ankle stretching

## 2014-07-10 ENCOUNTER — Other Ambulatory Visit: Payer: Self-pay

## 2014-07-25 ENCOUNTER — Other Ambulatory Visit: Payer: Self-pay | Admitting: Endocrinology

## 2014-07-25 DIAGNOSIS — I10 Essential (primary) hypertension: Secondary | ICD-10-CM | POA: Diagnosis not present

## 2014-07-25 DIAGNOSIS — R1011 Right upper quadrant pain: Secondary | ICD-10-CM

## 2014-07-25 DIAGNOSIS — E118 Type 2 diabetes mellitus with unspecified complications: Secondary | ICD-10-CM | POA: Diagnosis not present

## 2014-07-25 DIAGNOSIS — E790 Hyperuricemia without signs of inflammatory arthritis and tophaceous disease: Secondary | ICD-10-CM | POA: Diagnosis not present

## 2014-08-09 ENCOUNTER — Ambulatory Visit
Admission: RE | Admit: 2014-08-09 | Discharge: 2014-08-09 | Disposition: A | Discharge status: Home / Self Care | Payer: Federal, State, Local not specified - PPO | Source: Ambulatory Visit | Attending: Endocrinology | Admitting: Endocrinology

## 2014-08-09 DIAGNOSIS — K760 Fatty (change of) liver, not elsewhere classified: Secondary | ICD-10-CM | POA: Diagnosis not present

## 2014-08-09 DIAGNOSIS — R1011 Right upper quadrant pain: Secondary | ICD-10-CM

## 2014-08-18 ENCOUNTER — Emergency Department (HOSPITAL_COMMUNITY): Payer: Federal, State, Local not specified - PPO

## 2014-08-18 ENCOUNTER — Encounter (HOSPITAL_COMMUNITY): Payer: Self-pay

## 2014-08-18 ENCOUNTER — Other Ambulatory Visit: Payer: Self-pay | Admitting: Endocrinology

## 2014-08-18 ENCOUNTER — Emergency Department (HOSPITAL_COMMUNITY)
Admission: EM | Admit: 2014-08-18 | Discharge: 2014-08-18 | Disposition: A | Discharge status: Home / Self Care | Payer: Federal, State, Local not specified - PPO | Attending: Emergency Medicine | Admitting: Emergency Medicine

## 2014-08-18 DIAGNOSIS — Z79899 Other long term (current) drug therapy: Secondary | ICD-10-CM | POA: Diagnosis not present

## 2014-08-18 DIAGNOSIS — Z91040 Latex allergy status: Secondary | ICD-10-CM | POA: Insufficient documentation

## 2014-08-18 DIAGNOSIS — Z8673 Personal history of transient ischemic attack (TIA), and cerebral infarction without residual deficits: Secondary | ICD-10-CM | POA: Insufficient documentation

## 2014-08-18 DIAGNOSIS — D649 Anemia, unspecified: Secondary | ICD-10-CM | POA: Insufficient documentation

## 2014-08-18 DIAGNOSIS — Z3202 Encounter for pregnancy test, result negative: Secondary | ICD-10-CM | POA: Diagnosis not present

## 2014-08-18 DIAGNOSIS — R112 Nausea with vomiting, unspecified: Secondary | ICD-10-CM | POA: Diagnosis not present

## 2014-08-18 DIAGNOSIS — F410 Panic disorder [episodic paroxysmal anxiety] without agoraphobia: Secondary | ICD-10-CM | POA: Diagnosis not present

## 2014-08-18 DIAGNOSIS — Z9889 Other specified postprocedural states: Secondary | ICD-10-CM | POA: Diagnosis not present

## 2014-08-18 DIAGNOSIS — E1140 Type 2 diabetes mellitus with diabetic neuropathy, unspecified: Secondary | ICD-10-CM | POA: Diagnosis not present

## 2014-08-18 DIAGNOSIS — Z7982 Long term (current) use of aspirin: Secondary | ICD-10-CM | POA: Diagnosis not present

## 2014-08-18 DIAGNOSIS — R1031 Right lower quadrant pain: Secondary | ICD-10-CM | POA: Diagnosis not present

## 2014-08-18 DIAGNOSIS — Z794 Long term (current) use of insulin: Secondary | ICD-10-CM | POA: Diagnosis not present

## 2014-08-18 DIAGNOSIS — F329 Major depressive disorder, single episode, unspecified: Secondary | ICD-10-CM | POA: Diagnosis not present

## 2014-08-18 DIAGNOSIS — I10 Essential (primary) hypertension: Secondary | ICD-10-CM | POA: Insufficient documentation

## 2014-08-18 DIAGNOSIS — R1011 Right upper quadrant pain: Secondary | ICD-10-CM

## 2014-08-18 DIAGNOSIS — R109 Unspecified abdominal pain: Secondary | ICD-10-CM

## 2014-08-18 DIAGNOSIS — G8929 Other chronic pain: Secondary | ICD-10-CM | POA: Insufficient documentation

## 2014-08-18 DIAGNOSIS — K2970 Gastritis, unspecified, without bleeding: Secondary | ICD-10-CM | POA: Diagnosis not present

## 2014-08-18 LAB — CBC WITH DIFFERENTIAL/PLATELET
BASOS ABS: 0 10*3/uL (ref 0.0–0.1)
Basophils Relative: 0 % (ref 0–1)
EOS PCT: 3 % (ref 0–5)
Eosinophils Absolute: 0.3 10*3/uL (ref 0.0–0.7)
HCT: 36.7 % (ref 36.0–46.0)
Hemoglobin: 12.1 g/dL (ref 12.0–15.0)
Lymphocytes Relative: 13 % (ref 12–46)
Lymphs Abs: 1.2 10*3/uL (ref 0.7–4.0)
MCH: 26.9 pg (ref 26.0–34.0)
MCHC: 33 g/dL (ref 30.0–36.0)
MCV: 81.7 fL (ref 78.0–100.0)
Monocytes Absolute: 0.5 10*3/uL (ref 0.1–1.0)
Monocytes Relative: 6 % (ref 3–12)
Neutro Abs: 7.1 10*3/uL (ref 1.7–7.7)
Neutrophils Relative %: 78 % — ABNORMAL HIGH (ref 43–77)
Platelets: 343 10*3/uL (ref 150–400)
RBC: 4.49 MIL/uL (ref 3.87–5.11)
RDW: 13.6 % (ref 11.5–15.5)
WBC: 9.2 10*3/uL (ref 4.0–10.5)

## 2014-08-18 LAB — BASIC METABOLIC PANEL
Anion gap: 12 (ref 5–15)
BUN: 11 mg/dL (ref 6–20)
CO2: 24 mmol/L (ref 22–32)
Calcium: 9.5 mg/dL (ref 8.9–10.3)
Chloride: 104 mmol/L (ref 101–111)
Creatinine, Ser: 0.94 mg/dL (ref 0.44–1.00)
Glucose, Bld: 149 mg/dL — ABNORMAL HIGH (ref 65–99)
Potassium: 3.9 mmol/L (ref 3.5–5.1)
Sodium: 140 mmol/L (ref 135–145)

## 2014-08-18 LAB — URINALYSIS, ROUTINE W REFLEX MICROSCOPIC
BILIRUBIN URINE: NEGATIVE
Glucose, UA: NEGATIVE mg/dL
Ketones, ur: NEGATIVE mg/dL
LEUKOCYTES UA: NEGATIVE
NITRITE: NEGATIVE
PH: 7 (ref 5.0–8.0)
Protein, ur: >300 mg/dL — AB
Specific Gravity, Urine: 1.016 (ref 1.005–1.030)
Urobilinogen, UA: 0.2 mg/dL (ref 0.0–1.0)

## 2014-08-18 LAB — URINE MICROSCOPIC-ADD ON

## 2014-08-18 LAB — I-STAT BETA HCG BLOOD, ED (MC, WL, AP ONLY): I-stat hCG, quantitative: <5 m[IU]/mL (ref <5)

## 2014-08-18 MED ORDER — HYDROMORPHONE HCL 1 MG/ML IJ SOLN
1.0000 mg | Freq: Once | INTRAMUSCULAR | Status: AC
  Administered 2014-08-18: 1 mg via INTRAVENOUS
  Filled 2014-08-18: qty 1

## 2014-08-18 MED ORDER — SODIUM CHLORIDE 0.9 % IV BOLUS (SEPSIS)
1000.0000 mL | Freq: Once | INTRAVENOUS | Status: AC
  Administered 2014-08-18: 1000 mL via INTRAVENOUS

## 2014-08-18 MED ORDER — ONDANSETRON HCL 4 MG/2ML IJ SOLN
4.0000 mg | Freq: Once | INTRAMUSCULAR | Status: AC
  Administered 2014-08-18: 4 mg via INTRAVENOUS
  Filled 2014-08-18: qty 2

## 2014-08-18 NOTE — ED Notes (Signed)
Nurse starting IV 

## 2014-08-18 NOTE — ED Provider Notes (Signed)
CSN: [id number]     Arrival date & time 08/18/14  1252 History   First MD Initiated Contact with Patient 08/18/14 1330     Chief Complaint  Patient presents with  . Abdominal Pain      HPI Patient presents emergency department complaining of severe suprapubic pain radiating to her right flank.  Her pain is been colicky in nature.  She did have some vaginal bleeding today as well.  Associated nausea vomiting.  She also has a history of chronic right lower quadrant seroma that has been sterile with every prior drainage.  She chronically has some discomfort in the right lower quadrant reports this pain is different.  Denies vaginal complaints.  No dysuria.   Past Medical History  Diagnosis Date  . Spastic hemiplegia affecting dominant side   . Panic disorder without agoraphobia   . Depression   . Anxiety   . Diabetes mellitus   . Hypertension   . Blood transfusion     IN 2012  AFTER C -SECTION  . Cerebral thrombosis with cerebral infarction JUNE 2011    RIGHT SIDED WEAKNESS ( ARM AND LEG ) AND SPASMS  . Stroke   . Right rotator cuff tear     PAIN IN RIGHT SHOULDER  . Ventral hernia     RIGHT LOWER QUADRANT-CAUSING SOME PAIN  . Anemia     DURING MENSES--HAS HEAVY BLEEDING WITH PERODS  . Headache(784.0)     MIGRAINES--NOT REALLY HEADACHE-MORE LIKE PRESSURE SENSATION IN HEAD-FEELS DIZZIY AND  FAINT AS THE PRESSURE RESOLVES  . Diabetic neuropathy     BOTH FEET --COMES AND GOES  . Restless leg syndrome     DIAGNOSED BY SLEEP STUDY - PT TOLD SHE DID NOT HAVE SLEEP APNEA  . Rash     HANDS, ARMS --STATES HX OF RASH EVER SINCE CHILDBIRTH/PREGNANCY.  STATES THE RASH OFTEN OCCURS WHEN SHE IS REALLY STRESSED.  Marland Kitchen Hernia, incisional, RLQ, s/p lap repair Sep 2013 09/02/2011  . SBO (small bowel obstruction) 06/03/2012  . Back pain, chronic   . Leg pain, right   . Hx of migraines 10/19/2011   Past Surgical History  Procedure Laterality Date  . Cesarean section  2012  . Uterine fibroid  surgery      2 SURGERIES FOR FIBROIDS  . Ureter revision    . Ventral hernia repair  10/03/2011    Procedure: LAPAROSCOPIC VENTRAL HERNIA;  Surgeon: Adin Hector, MD;  Location: [facility];  Service: General;  Laterality: N/A;  . Hernia repair  10/03/11    ventral hernia repair  . Esophagogastroduodenoscopy N/A 06/03/2012    Procedure: ESOPHAGOGASTRODUODENOSCOPY (EGD);  Surgeon: [name] Craver, MD;  Location: [facility] ENDOSCOPY;  Service: Endoscopy;  Laterality: N/A;  . Diagnostic laparoscopy    . Umbilical hernia repair N/A 02/04/2013    Procedure: LAPAROSCOPIC ventral wall hernia repair LAPAROSCOPIC LYSIS OF ADHESIONS laparoscopic exploration of abdomen ;  Surgeon: Adin Hector, MD;  Location: [facility];  Service: General;  Laterality: N/A;  . Insertion of mesh N/A 02/04/2013    Procedure: INSERTION OF MESH;  Surgeon: Adin Hector, MD;  Location: [facility];  Service: General;  Laterality: N/A;   Family History  Problem Relation Age of Onset  . Diabetes Father   . Kidney disease Father   . Other Neg Hx   . Diabetes Maternal Grandmother   . Hyperlipidemia Paternal Grandmother   . Stroke Paternal Grandmother    History  Substance Use Topics  . Smoking  status: Never Smoker   . Smokeless tobacco: Never Used  . Alcohol Use: No   OB History    Gravida Para Term Preterm AB TAB SAB Ectopic Multiple Living   1 1 0 1 0 0 0 0 0 1      Review of Systems  All other systems reviewed and are negative.     Allergies  Contrast media; Iohexol; Midazolam hcl; Shellfish allergy; Metformin and related; Other; Avandia; Geodon; Kiwi extract; and Latex  Home Medications   Prior to Admission medications   Medication Sig Start Date End Date Taking? Authorizing Provider  acetaminophen-codeine (TYLENOL #2) 300-15 MG per tablet Take 1 tablet by mouth every 6 (six) hours as needed for moderate pain. Patient not taking: Reported on 04/12/2014 03/08/14   Jonetta Osgood, MD  allopurinol (ZYLOPRIM) 100 MG tablet  Take 100 mg by mouth every evening. 03/13/14   Historical Provider, MD  Amlodipine-Valsartan-HCTZ 97-989-21 MG TABS Take 1 tablet by mouth every evening.  01/09/13   Historical Provider, MD  aspirin 325 MG tablet Take 325 mg by mouth at bedtime.     Historical Provider, MD  Biotin 5000 MCG TABS Take 1 tablet by mouth daily.    Historical Provider, MD  BYSTOLIC 10 MG tablet Take 10 mg by mouth every morning.  01/05/12   Historical Provider, MD  colchicine 0.6 MG tablet Take 0.6 mg by mouth as needed. For gout flareup 02/14/14   Historical Provider, MD  furosemide (LASIX) 40 MG tablet Take 20-40 mg by mouth daily as needed for fluid or edema.     Historical Provider, MD  gabapentin (NEURONTIN) 300 MG capsule Take 1 capsule (300 mg total) by mouth 3 (three) times daily. 05/30/14   [name], MD  insulin glargine (LANTUS) 100 UNIT/ML injection Inject 10-15 Units into the skin 2 (two) times daily. 15 units in the morning and 10 mg in the evening    Historical Provider, MD  INVOKANA 300 MG TABS tablet Take 300 mg by mouth daily. 12/17/13   Historical Provider, MD  Liraglutide (VICTOZA) 18 MG/3ML SOPN Inject 1.2 mg into the skin daily.     Historical Provider, MD  NOVOLOG FLEXPEN 100 UNIT/ML FlexPen Inject 5-12 Units into the skin 3 (three) times daily with meals.  12/14/13   Historical Provider, MD  ondansetron (ZOFRAN) 4 MG tablet Take 1 tablet (4 mg total) by mouth every 6 (six) hours as needed for nausea. 03/08/14   Shanker Kristeen Mans, MD  Prenatal Vit-Fe Fumarate-FA (PRENATAL MULTIVITAMIN) TABS tablet Take 1 tablet by mouth every evening.     Historical Provider, MD  saccharomyces boulardii (FLORASTOR) 250 MG capsule Take 1 capsule (250 mg total) by mouth 2 (two) times daily. 03/08/14   Shanker Kristeen Mans, MD  tiZANidine (ZANAFLEX) 4 MG tablet TAKE 1 TABLET (4 MG TOTAL) BY MOUTH 2 (TWO) TIMES DAILY. Patient taking differently: TAKE 1 TABLET (4 MG TOTAL) BY MOUTH AT BEDTIME 01/16/14   [name],  MD  traMADol (ULTRAM) 50 MG tablet Take 1 tablet (50 mg total) by mouth every 12 (twelve) hours as needed. 05/30/14   [name], MD   BP 149/80 mmHg  Pulse 104  Temp(Src) 98.6 F (37 C) (Oral)  Resp 24  SpO2 97%  LMP 08/17/2014 Physical Exam  Constitutional: She is oriented to person, place, and time. She appears well-developed and well-nourished. No distress.  HENT:  Head: Normocephalic and atraumatic.  Eyes: EOM are normal.  Neck: Normal  range of motion.  Cardiovascular: Normal rate, regular rhythm and normal heart sounds.   Pulmonary/Chest: Effort normal and breath sounds normal.  Abdominal: Soft. She exhibits no distension.  Mild right lower quadrant tenderness without guarding or rebound.  No overlying skin changes.  Musculoskeletal: Normal range of motion.  Neurological: She is alert and oriented to person, place, and time.  Skin: Skin is warm and dry.  Psychiatric: She has a normal mood and affect. Judgment normal.  Nursing note and vitals reviewed.   ED Course  Procedures (including critical care time) Labs Review Labs Reviewed  URINALYSIS, ROUTINE W REFLEX MICROSCOPIC (NOT AT [facility]) - Abnormal; Notable for the following:    Color, Urine RED (*)    APPearance CLOUDY (*)    Hgb urine dipstick LARGE (*)    Protein, ur >300 (*)    All other components within normal limits  CBC WITH DIFFERENTIAL/PLATELET - Abnormal; Notable for the following:    Neutrophils Relative % 78 (*)    All other components within normal limits  BASIC METABOLIC PANEL - Abnormal; Notable for the following:    Glucose, Bld 149 (*)    All other components within normal limits  URINE MICROSCOPIC-ADD ON - Abnormal; Notable for the following:    Bacteria, UA FEW (*)    All other components within normal limits  I-STAT BETA HCG BLOOD, ED ([facility], [facility], [facility] ONLY)    Imaging Review Ct Renal Stone Study  08/18/2014   CLINICAL DATA:  Sudden onset of suprapubic pain and pelvic pressure 8 hours ago.  Nausea and vomiting. Previous history of abdominal wall abscess and drainage catheter. Previous right lower quadrant hernia repair.  EXAM: CT ABDOMEN AND PELVIS WITHOUT CONTRAST  TECHNIQUE: Multidetector CT imaging of the abdomen and pelvis was performed following the standard protocol without IV contrast.  COMPARISON:  03/19/2014  FINDINGS: Lung bases are clear.  No pleural or pericardial fluid.  The liver has a normal appearance without contrast with the exception of mild fatty change adjacent to the falciform ligament, not significant. No calcified gallstones. The spleen is normal. The pancreas is normal. The adrenal glands are normal. The kidneys show areas of focal scarring in the left kidney is smaller than the right in general. No evidence of mass, stone or hydronephrosis. The aorta shows some atherosclerotic change but no aneurysm. The IVC is normal. No retroperitoneal mass or adenopathy. No free intraperitoneal fluid or air seen in the abdominal portion of the scan. Extensive previous right-sided abdominal wall surgery is evident.  A drain previously in place within a right anterior abdominal wall fluid collection has been removed. The fluid collection persists, extending both superficial to and deep to the abdominal wall, measuring 7.5 x 6.4 cm today, only minimally smaller than on the study of March when the drain was in place.  The uterus continues show a calcified leiomyoma on the left. Both ovaries are seen. On the left, a 5 cm cyst previously seen is no longer present. On the right, there is a cyst associated with the ovary measuring approximately 2 cm in size, presumably a functional cyst.  The appendix is normal. No free fluid in the pelvis. The bladder appears normal. Some blood is evident in the vaginal canal. The patient is currently having menses by history.  IMPRESSION: Previous abdominal wall repair on the right. Previous catheter drainage of a right-sided abdominal wall collection.  Persistence or recurrence of the collection measuring 7.5 x 6.4 cm, extending both superficial to  and deep to the abdominal wall. This could be sterile or infected. No free fluid or air.   Electronically Signed   By: Nelson Chimes M.D.   On: 08/18/2014 16:17  I personally reviewed the imaging tests through PACS system I reviewed available ER/hospitalization records through the EMR    EKG Interpretation None      MDM   Final diagnoses:  Right flank pain    4:44 PM Patient is a complete resolution of her discomfort.  This could represent a ureteral colic.  No hydrocele this time.  She has a long-standing history of chronic sterile seroma.  She states she's had no increasing pain in her right lower quadrant reports that these symptoms came on abruptly acutely today.  She denies fever at home.  Her white count is reassuring.  Close primary care follow-up and outpatient general surgery follow-up.  She is well aware of this chronic seroma and reports that component of her discomfort seems unchanged.  She understands to return the emergency department for new or worsening symptoms.    Jola Schmidt, MD 08/18/14 [phone]

## 2014-08-18 NOTE — ED Notes (Signed)
Pt transported by [facility] for suprapubic pain and external genitalia pain since 0800 this morning.  Pt reported to EMS that vagina is swollen.  Pt is currently menstruating. Pt has hx of fibroids and endometriosis.

## 2014-08-18 NOTE — ED Notes (Signed)
Pt c/o sudden onset of suprapubic pain and pressure in vagina today at 0800.  Pt states she is currently menstruating and started period one day ago.  Pt also c/o nausea and vomiting due to pain.  Pt states she has urinary urgency.

## 2014-08-18 NOTE — ED Notes (Signed)
Bed: WA03 Expected date:  Expected time:  Means of arrival:  Comments: EMS- 44yo F, abdominal and genital pain

## 2014-08-24 ENCOUNTER — Other Ambulatory Visit: Payer: Federal, State, Local not specified - PPO

## 2014-08-26 ENCOUNTER — Other Ambulatory Visit: Payer: Self-pay | Admitting: Physical Medicine & Rehabilitation

## 2014-09-05 ENCOUNTER — Other Ambulatory Visit: Payer: Self-pay | Admitting: *Deleted

## 2014-09-05 MED ORDER — GABAPENTIN 300 MG PO CAPS: 300.0000 mg | ORAL_CAPSULE | Freq: Three times a day (TID) | ORAL | Status: DC

## 2014-09-12 DIAGNOSIS — Z0271 Encounter for disability determination: Secondary | ICD-10-CM

## 2014-09-28 DIAGNOSIS — M25569 Pain in unspecified knee: Secondary | ICD-10-CM | POA: Diagnosis not present

## 2014-09-28 DIAGNOSIS — E118 Type 2 diabetes mellitus with unspecified complications: Secondary | ICD-10-CM | POA: Diagnosis not present

## 2014-09-28 DIAGNOSIS — M79673 Pain in unspecified foot: Secondary | ICD-10-CM | POA: Diagnosis not present

## 2014-09-28 DIAGNOSIS — W19XXXA Unspecified fall, initial encounter: Secondary | ICD-10-CM | POA: Diagnosis not present

## 2014-10-05 DIAGNOSIS — G629 Polyneuropathy, unspecified: Secondary | ICD-10-CM | POA: Diagnosis not present

## 2014-10-05 DIAGNOSIS — M109 Gout, unspecified: Secondary | ICD-10-CM | POA: Diagnosis not present

## 2014-10-05 DIAGNOSIS — E118 Type 2 diabetes mellitus with unspecified complications: Secondary | ICD-10-CM | POA: Diagnosis not present

## 2014-10-10 ENCOUNTER — Ambulatory Visit (INDEPENDENT_AMBULATORY_CARE_PROVIDER_SITE_OTHER): Payer: Federal, State, Local not specified - PPO | Admitting: Adult Health

## 2014-10-10 ENCOUNTER — Encounter: Payer: Self-pay | Admitting: Adult Health

## 2014-10-10 VITALS — BP 125/78 | HR 81 | Ht 65.0 in | Wt 232.0 lb

## 2014-10-10 DIAGNOSIS — R51 Headache: Secondary | ICD-10-CM

## 2014-10-10 DIAGNOSIS — R519 Headache, unspecified: Secondary | ICD-10-CM

## 2014-10-10 DIAGNOSIS — Z8673 Personal history of transient ischemic attack (TIA), and cerebral infarction without residual deficits: Secondary | ICD-10-CM | POA: Diagnosis not present

## 2014-10-10 NOTE — Progress Notes (Signed)
PATIENT: Tina [name] DOB: 1970/08/01  REASON FOR VISIT: follow up- stroke, tension type headache HISTORY FROM: patient  HISTORY OF PRESENT ILLNESS: Tina [name] is a 44 year old female with a history of stroke and tension-type headaches. She returns today for follow-up. The patient continues to take aspirin for stroke prevention. Denies any significant bruising or bleeding. The patient has maintained good control of her blood pressure. The patient does state that her hemoglobin A1c was elevated at 8.7. She states that this was contributed to a flareup of gout which caused her significant discomfort. She states that she has another appointment next month for repeat lab work. The patient denies any strokelike symptoms. She continues to have headaches. However she does not want to try anything daily. She states that her headache tends to present differently. Occasionally she'll have left-sided pain with a sharp stabbing pain behind the eye. At other times she'll have pain in the back of the head in the neck. She also states that sometimes the pain is in the frontal region. When she does have a headache she uses the tramadol. If the headache is severe she can take tizanidine and her headache resolves. She denies any new medical history is she returns today for an evaluation  HISTORY 04/12/14; Tina [name] is a 44 year old female with a history of stroke and tension-type headaches. She returns today for follow-up. She continues taking aspirin for stroke prevention. She states her blood pressure has been well controlled. She states she recently had blood work with her primary care. Unsure what her cholesterol was. She states her hemoglobin A1c has recently been elevated but this is due to some complications with a hernia repair. He states that she's had several surgeries for hernia complications. She recently developed a fluid pocket as well as an infection and had repair on antibiotics. She states  her blood sugars are finally starting to get back to normal. The patient states that she continues to have tension type headaches. She will have approximately 3 a week. She states that the pressure sensation feels like a balloon blown up in her head and then releasing. She states that this can last for several minutes and then resolves. She states that she will usually have 1-2 headaches that involve sharp pain behind the left eye. She states this will call some photophobia and she'll have to delay in a dark room to get rid of the headache. He denies any strokelike symptoms. Although she does state when she had her surgery she did develop right-sided weakness after both surgeries and had to participate in physical therapy. She returns today for an evaluation.  REVIEW OF SYSTEMS: Out of a complete 14 system review of symptoms, the patient complains only of the following symptoms, and all other reviewed systems are negative.  Appetite change, chills, fatigue, ringing in ears, runny nose, light sensitivity, eye pain, cough, shortness of breath, leg swelling, swollen abdomen, abdominal pain, nausea, restless leg, insomnia, frequent waking, daytime sleepiness, joint pain, back pain, aching muscles, muscle cramps, walking difficulty, anemia, dizziness, headache, numbness, weakness, tremors, agitation, confusion, depression, nervous/anxious  ALLERGIES: Allergies  Allergen Reactions  . Contrast Media [Iodinated Diagnostic Agents] Other (See Comments)    Difficulty breathing  . Iohexol Hives, Nausea And Vomiting and Swelling     Desc: Magnevist-gadolinium-difficulty breathing, throat swelling   . Midazolam Hcl Anaphylaxis    Difficulty breathing  . Shellfish Allergy Anaphylaxis  . Metformin And Related Diarrhea and Nausea And Vomiting  . Other  Itching    Patient is allergic to all nuts except peanuts.   Donalda Ewings [Rosiglitazone Maleate] Other (See Comments)    Patient doesn't remember reaction  . Geodon  [Ziprasidone] Other (See Comments)    UNKNOWN  . Kiwi Extract Itching and Swelling  . Latex Itching    HOME MEDICATIONS: Outpatient Prescriptions Prior to Visit  Medication Sig Dispense Refill  . allopurinol (ZYLOPRIM) 100 MG tablet Take 100 mg by mouth every evening.    . Amlodipine-Valsartan-HCTZ 10-320-25 MG TABS Take 1 tablet by mouth every evening.     Marland Kitchen aspirin 325 MG tablet Take 325 mg by mouth at bedtime.     . Biotin 5000 MCG TABS Take 1 tablet by mouth daily.    Marland Kitchen BYSTOLIC 10 MG tablet Take 10 mg by mouth every morning.     . colchicine 0.6 MG tablet Take 0.6 mg by mouth as needed. For gout flareup  1  . furosemide (LASIX) 40 MG tablet Take 20-40 mg by mouth daily as needed for fluid or edema.     . gabapentin (NEURONTIN) 300 MG capsule Take 1 capsule (300 mg total) by mouth 3 (three) times daily. 270 capsule 1  . insulin glargine (LANTUS) 100 UNIT/ML injection Inject 10-15 Units into the skin 2 (two) times daily. 15 units in the morning and 10 mg in the evening    . Liraglutide (VICTOZA) 18 MG/3ML SOPN Inject 1.2 mg into the skin daily.     Marland Kitchen NOVOLOG FLEXPEN 100 UNIT/ML FlexPen Inject 5-12 Units into the skin 3 (three) times daily with meals.   99  . ondansetron (ZOFRAN) 4 MG tablet Take 1 tablet (4 mg total) by mouth every 6 (six) hours as needed for nausea. 20 tablet 0  . Prenatal Vit-Fe Fumarate-FA (PRENATAL MULTIVITAMIN) TABS tablet Take 1 tablet by mouth every evening.     . saccharomyces boulardii (FLORASTOR) 250 MG capsule Take 1 capsule (250 mg total) by mouth 2 (two) times daily. 30 capsule 0  . tiZANidine (ZANAFLEX) 4 MG tablet TAKE 1 TABLET (4 MG TOTAL) BY MOUTH 2 (TWO) TIMES DAILY. 60 tablet 1  . traMADol (ULTRAM) 50 MG tablet Take 1 tablet (50 mg total) by mouth every 12 (twelve) hours as needed. 60 tablet 5  . acetaminophen-codeine (TYLENOL #2) 300-15 MG per tablet Take 1 tablet by mouth every 6 (six) hours as needed for moderate pain. (Patient not taking: Reported  on 10/10/2014) 30 tablet 0   No facility-administered medications prior to visit.    PAST MEDICAL HISTORY: Past Medical History  Diagnosis Date  . Spastic hemiplegia affecting dominant side   . Panic disorder without agoraphobia   . Depression   . Anxiety   . Diabetes mellitus   . Hypertension   . Blood transfusion     IN 2012  AFTER C -SECTION  . Cerebral thrombosis with cerebral infarction JUNE 2011    RIGHT SIDED WEAKNESS ( ARM AND LEG ) AND SPASMS  . Stroke   . Right rotator cuff tear     PAIN IN RIGHT SHOULDER  . Ventral hernia     RIGHT LOWER QUADRANT-CAUSING SOME PAIN  . Anemia     DURING MENSES--HAS HEAVY BLEEDING WITH PERODS  . Headache(784.0)     MIGRAINES--NOT REALLY HEADACHE-MORE LIKE PRESSURE SENSATION IN HEAD-FEELS DIZZIY AND  FAINT AS THE PRESSURE RESOLVES  . Diabetic neuropathy     BOTH FEET --COMES AND GOES  . Restless leg syndrome  DIAGNOSED BY SLEEP STUDY - PT TOLD SHE DID NOT HAVE SLEEP APNEA  . Rash     HANDS, ARMS --STATES HX OF RASH EVER SINCE CHILDBIRTH/PREGNANCY.  STATES THE RASH OFTEN OCCURS WHEN SHE IS REALLY STRESSED.  Marland Kitchen Hernia, incisional, RLQ, s/p lap repair Sep 2013 09/02/2011  . SBO (small bowel obstruction) 06/03/2012  . Back pain, chronic   . Leg pain, right   . Hx of migraines 10/19/2011    PAST SURGICAL HISTORY: Past Surgical History  Procedure Laterality Date  . Cesarean section  2012  . Uterine fibroid surgery      2 SURGERIES FOR FIBROIDS  . Ureter revision    . Ventral hernia repair  10/03/2011    Procedure: LAPAROSCOPIC VENTRAL HERNIA;  Surgeon: Adin Hector, MD;  Location: [facility];  Service: General;  Laterality: N/A;  . Hernia repair  10/03/11    ventral hernia repair  . Esophagogastroduodenoscopy N/A 06/03/2012    Procedure: ESOPHAGOGASTRODUODENOSCOPY (EGD);  Surgeon: [name] Craver, MD;  [facility] ENDOSCOPY;  Service: Endoscopy;  Laterality: N/A;  . Diagnostic laparoscopy    . Umbilical hernia repair N/A 02/04/2013     Procedure: LAPAROSCOPIC ventral wall hernia repair LAPAROSCOPIC LYSIS OF ADHESIONS laparoscopic exploration of abdomen ;  Surgeon: Adin Hector, MD;  Location: [facility];  Service: General;  Laterality: N/A;  . Insertion of mesh N/A 02/04/2013    Procedure: INSERTION OF MESH;  Surgeon: Adin Hector, MD;  Location: [facility];  Service: General;  Laterality: N/A;    FAMILY HISTORY: Family History  Problem Relation Age of Onset  . Diabetes Father   . Kidney disease Father   . Other Neg Hx   . Diabetes Maternal Grandmother   . Hyperlipidemia Paternal Grandmother   . Stroke Paternal Grandmother     SOCIAL HISTORY: Social History   Social History  . Marital Status: Married    Spouse Name: N/A  . Number of Children: 2  . Years of Education: BA degree   Occupational History  .     Social History Main Topics  . Smoking status: Never Smoker   . Smokeless tobacco: Never Used  . Alcohol Use: No  . Drug Use: No  . Sexual Activity: Yes    Birth Control/ Protection: None   Other Topics Concern  . Not on file   Social History Narrative   Patient is married with 2 children.   Patient is right handed.   Patient has her  BA degree.   Patient drinks 1 cup daily.      PHYSICAL EXAM  Filed Vitals:   10/10/14 1039  BP: 125/78  Pulse: 81  Height: 5\' 5"  (1.651 m)  Weight: 232 lb (105.235 kg)   Body mass index is 38.61 kg/(m^2).  Generalized: Well developed, in no acute distress   Neurological examination  Mentation: Alert oriented to time, place, history taking. Follows all commands speech and language fluent Cranial nerve II-XII: Pupils were equal round reactive to light. Extraocular movements were full, visual field were full on confrontational test. Facial sensation and strength were normal. Uvula tongue midline. Head turning and shoulder shrug  were normal and symmetric. Motor: The motor testing reveals 5 over 5 strength of all 4 extremities. Good symmetric motor tone is  noted throughout.  Sensory: Sensory testing is intact to soft touch on all 4 extremities. No evidence of extinction is noted.  Coordination: Cerebellar testing reveals good finger-nose-finger and heel-to-shin bilaterally.  Gait and station: Gait  is normal. Tandem gait is normal. Romberg is negative. No drift is seen.  Reflexes: Deep tendon reflexes are symmetric and normal bilaterally.   DIAGNOSTIC DATA (LABS, IMAGING, TESTING) - I reviewed patient records, labs, notes, testing and imaging myself where available.  Lab Results  Component Value Date   WBC 9.2 08/18/2014   HGB 12.1 08/18/2014   HCT 36.7 08/18/2014   MCV 81.7 08/18/2014   PLT 343 08/18/2014      Component Value Date/Time   NA 140 08/18/2014 1410   K 3.9 08/18/2014 1410   CL 104 08/18/2014 1410   CO2 24 08/18/2014 1410   GLUCOSE 149* 08/18/2014 1410   BUN 11 08/18/2014 1410   CREATININE 0.94 08/18/2014 1410   CALCIUM 9.5 08/18/2014 1410   PROT 6.5 03/19/2014 1951   ALBUMIN 3.0* 03/19/2014 1951   AST 14 03/19/2014 1951   ALT 15 03/19/2014 1951   ALKPHOS 64 03/19/2014 1951   BILITOT 0.3 03/19/2014 1951   GFRNONAA >60 08/18/2014 1410   GFRAA >60 08/18/2014 1410    ASSESSMENT AND PLAN 44 y.o. year old female  has a past medical history of Spastic hemiplegia affecting dominant side; Panic disorder without agoraphobia; Depression; Anxiety; Diabetes mellitus; Hypertension; Blood transfusion; Cerebral thrombosis with cerebral infarction (JUNE 2011); Stroke; Right rotator cuff tear; Ventral hernia; Anemia; Headache(784.0); Diabetic neuropathy; Restless leg syndrome; Rash; Hernia, incisional, RLQ, s/p lap repair Sep 2013 (09/02/2011); SBO (small bowel obstruction) (06/03/2012); Back pain, chronic; Leg pain, right; and migraines (10/19/2011). here with:  1. History of CVA 2. Tension headaches  The patient will continue aspirin for stroke prevention with strict control of hypertension with blood pressure goal below 130/90 and  diabetes with hemoglobin A1c go to 6.5%. Cholesterol LDL less than 100. The patient does not want to try daily medication for her headaches. I have advised patient that if her headaches become more frequent or her severity increases she should let us know. She will follow-up in one year with Dr. [name].   [name], MSN, NP-C 10/10/2014, 10:47 AM [facility] [address] [place], [place] [address] [phone]

## 2014-10-10 NOTE — Patient Instructions (Addendum)
The patient will continue aspirin for stroke prevention with strict control of hypertension with blood pressure goal below 130/90  diabetes with hemoglobin A1c go to 6.5%.  Cholesterol LDL <100 If your symptoms worsen or you develop new symptoms please let us know.

## 2014-10-10 NOTE — Progress Notes (Signed)
I agree with the above plan 

## 2014-11-09 ENCOUNTER — Encounter: Payer: Self-pay | Admitting: Physical Medicine & Rehabilitation

## 2014-11-09 ENCOUNTER — Encounter: Payer: Federal, State, Local not specified - PPO | Attending: Physical Medicine & Rehabilitation

## 2014-11-09 ENCOUNTER — Ambulatory Visit (HOSPITAL_BASED_OUTPATIENT_CLINIC_OR_DEPARTMENT_OTHER): Payer: Federal, State, Local not specified - PPO | Admitting: Physical Medicine & Rehabilitation

## 2014-11-09 VITALS — BP 154/85 | HR 86 | Resp 16

## 2014-11-09 DIAGNOSIS — M533 Sacrococcygeal disorders, not elsewhere classified: Secondary | ICD-10-CM | POA: Diagnosis not present

## 2014-11-09 DIAGNOSIS — M7061 Trochanteric bursitis, right hip: Secondary | ICD-10-CM | POA: Diagnosis not present

## 2014-11-09 DIAGNOSIS — K439 Ventral hernia without obstruction or gangrene: Secondary | ICD-10-CM | POA: Diagnosis not present

## 2014-11-09 DIAGNOSIS — Z79899 Other long term (current) drug therapy: Secondary | ICD-10-CM | POA: Diagnosis not present

## 2014-11-09 DIAGNOSIS — F410 Panic disorder [episodic paroxysmal anxiety] without agoraphobia: Secondary | ICD-10-CM | POA: Insufficient documentation

## 2014-11-09 DIAGNOSIS — E1140 Type 2 diabetes mellitus with diabetic neuropathy, unspecified: Secondary | ICD-10-CM | POA: Diagnosis not present

## 2014-11-09 DIAGNOSIS — I639 Cerebral infarction, unspecified: Secondary | ICD-10-CM | POA: Insufficient documentation

## 2014-11-09 DIAGNOSIS — D62 Acute posthemorrhagic anemia: Secondary | ICD-10-CM | POA: Diagnosis not present

## 2014-11-09 DIAGNOSIS — I69351 Hemiplegia and hemiparesis following cerebral infarction affecting right dominant side: Secondary | ICD-10-CM | POA: Insufficient documentation

## 2014-11-09 DIAGNOSIS — F329 Major depressive disorder, single episode, unspecified: Secondary | ICD-10-CM | POA: Insufficient documentation

## 2014-11-09 DIAGNOSIS — I10 Essential (primary) hypertension: Secondary | ICD-10-CM | POA: Insufficient documentation

## 2014-11-09 DIAGNOSIS — I669 Occlusion and stenosis of unspecified cerebral artery: Secondary | ICD-10-CM | POA: Insufficient documentation

## 2014-11-09 DIAGNOSIS — G43909 Migraine, unspecified, not intractable, without status migrainosus: Secondary | ICD-10-CM | POA: Insufficient documentation

## 2014-11-09 MED ORDER — TIZANIDINE HCL 4 MG PO TABS: ORAL_TABLET | ORAL | Status: DC

## 2014-11-09 MED ORDER — TRAMADOL HCL 50 MG PO TABS: 50.0000 mg | ORAL_TABLET | Freq: Two times a day (BID) | ORAL | Status: DC | PRN

## 2014-11-09 NOTE — Patient Instructions (Signed)
Sacroiliac Joint Dysfunction Sacroiliac joint dysfunction is a condition that causes inflammation on one or both sides of the sacroiliac (SI) joint. The SI joint connects the lower part of the spine (sacrum) with the two upper portions of the pelvis (ilium). This condition causes deep aching or burning pain in the low back. In some cases, the pain may also spread into one or both buttocks or hips or spread down the legs. CAUSES This condition may be caused by:  Pregnancy. During pregnancy, extra stress is put on the SI joints because the pelvis widens.  Injury, such as:  Car accidents.  Sport-related injuries.  Work-related injuries.  Having one leg that is shorter than the other.  Conditions that affect the joints, such as:  Rheumatoid arthritis.  Gout.  Psoriatic arthritis.  Joint infection (septic arthritis). Sometimes, the cause of SI joint dysfunction is not known. SYMPTOMS Symptoms of this condition include:  Aching or burning pain in the lower back. The pain may also spread to other areas, such as:  Buttocks.  Groin.  Thighs and legs.  Muscle spasms in or around the painful areas.  Increased pain when standing, walking, running, stair climbing, bending, or lifting. DIAGNOSIS Your health care provider will do a physical exam and take your medical history. During the exam, the health care provider may move one or both of your legs to different positions to check for pain. Various tests may be done to help verify the diagnosis, including:  Imaging tests to look for other causes of pain. These may include:  MRI.  CT scan.  Bone scan.  Diagnostic injection. A numbing medicine is injected into the SI joint using a needle. If the pain is temporarily improved or stopped after the injection, this can indicate that SI joint dysfunction is the problem. TREATMENT Treatment may vary depending on the cause and severity of your condition. Treatment options may  include:  Applying ice or heat to the lower back area. This can help to reduce pain and muscle spasms.  Medicines to relieve pain or inflammation or to relax the muscles.  Wearing a back brace (sacroiliac brace) to help support the joint while your back is healing.  Physical therapy to increase muscle strength around the joint and flexibility at the joint. This may also involve learning proper body positions and ways of moving to relieve stress on the joint.  Direct manipulation of the SI joint.  Injections of steroid medicine into the joint in order to reduce pain and swelling.  Radiofrequency ablation to burn away nerves that are carrying pain messages from the joint.  Use of a device that provides electrical stimulation in order to reduce pain at the joint.  Surgery to put in screws and plates that limit or prevent joint motion. This is rare. HOME CARE INSTRUCTIONS  Rest as needed. Limit your activities as directed by your health care provider.  Take medicines only as directed by your health care provider.  If directed, apply ice to the affected area:  Put ice in a plastic bag.  Place a towel between your skin and the bag.  Leave the ice on for 20 minutes, 2-3 times per day.  Use a heating pad or a moist heat pack as directed by your health care provider.  Exercise as directed by your health care provider or physical therapist.  Keep all follow-up visits as directed by your health care provider. This is important. SEEK MEDICAL CARE IF:  Your pain is not controlled   with medicine.  You have a fever.  You have increasingly severe pain. SEEK IMMEDIATE MEDICAL CARE IF:  You have weakness, numbness, or tingling in your legs or feet.  You lose control of your bladder or bowel.   This information is not intended to replace advice given to you by your health care provider. Make sure you discuss any questions you have with your health care provider.   Document Released:  03/28/2008 Document Revised: 05/16/2014 Document Reviewed: 09/06/2013 Elsevier Interactive Patient Education 2016 [employer]  

## 2014-11-09 NOTE — Progress Notes (Signed)
Subjective:    Patient ID: Tina [name], female    DOB: 1970-05-12, 44 y.o.   MRN: [id number]  HPI 44 year old female with history of left pontine infarct in 2011. She underwent inpatient rehabilitation followed by outpatient rehabilitation. She has had right hemiplegic shoulder pain requiring injections as well as medications. The patient regained independent status after rehabilitation and has been able to care for her 3 young children. She ambulates without an assistive device but still has a limp. She's had a couple falls 1 due to some water on a floor that she did not know about.  Her chief complaint today is right hip and low back pain.Patient states that her pain did not start after a fall but has come on gradually. She has no new radiating symptoms down the right leg. She has a history of spasms and has been taking Zanaflex on a chronic basis. She has no bowel or bladder incontinence. Hip pain interferes with sleep at night on the right side. Pain Inventory Average Pain 8 Pain Right Now 8 My pain is sharp and aching  In the last 24 hours, has pain interfered with the following? General activity 8 Relation with others 8 Enjoyment of life 0 What TIME of day is your pain at its worst? Morning, Daytime, Evening, Night Sleep (in general) Poor  Pain is worse with: walking, sitting and standing Pain improves with: NA Relief from Meds: NA  Mobility how many minutes can you walk? 15-20 ability to climb steps?  yes do you drive?  no Do you have any goals in this area?  no  Function I need assistance with the following:  bathing, meal prep, household duties and shopping  Neuro/Psych weakness numbness tremor tingling trouble walking spasms dizziness depression anxiety  Prior Studies Any changes since last visit?  no  Physicians involved in your care Any changes since last visit?  no   Family History  Problem Relation Age of Onset  . Diabetes Father   .  Kidney disease Father   . Other Neg Hx   . Diabetes Maternal Grandmother   . Hyperlipidemia Paternal Grandmother   . Stroke Paternal Grandmother    Social History   Social History  . Marital Status: Married    Spouse Name: N/A  . Number of Children: 2  . Years of Education: BA degree   Occupational History  .     Social History Main Topics  . Smoking status: Never Smoker   . Smokeless tobacco: Never Used  . Alcohol Use: No  . Drug Use: No  . Sexual Activity: Yes    Birth Control/ Protection: None   Other Topics Concern  . None   Social History Narrative   Patient is married with 2 children.   Patient is right handed.   Patient has her  BA degree.   Patient drinks 1 cup daily.   Past Surgical History  Procedure Laterality Date  . Cesarean section  2012  . Uterine fibroid surgery      2 SURGERIES FOR FIBROIDS  . Ureter revision    . Ventral hernia repair  10/03/2011    Procedure: LAPAROSCOPIC VENTRAL HERNIA;  Surgeon: Adin Hector, MD;  Location: [facility];  Service: General;  Laterality: N/A;  . Hernia repair  10/03/11    ventral hernia repair  . Esophagogastroduodenoscopy N/A 06/03/2012    Procedure: ESOPHAGOGASTRODUODENOSCOPY (EGD);  Surgeon: [name] Craver, MD;  Location: [facility] ENDOSCOPY;  Service: Endoscopy;  Laterality: N/A;  .  Diagnostic laparoscopy    . Umbilical hernia repair N/A 02/04/2013    Procedure: LAPAROSCOPIC ventral wall hernia repair LAPAROSCOPIC LYSIS OF ADHESIONS laparoscopic exploration of abdomen ;  Surgeon: Adin Hector, MD;  Location: [facility];  Service: General;  Laterality: N/A;  . Insertion of mesh N/A 02/04/2013    Procedure: INSERTION OF MESH;  Surgeon: Adin Hector, MD;  Location: [facility];  Service: General;  Laterality: N/A;   Past Medical History  Diagnosis Date  . Spastic hemiplegia affecting dominant side ([place])   . Panic disorder without agoraphobia   . Depression   . Anxiety   . Diabetes mellitus   . Hypertension   . Blood  transfusion     IN 2012  AFTER C -SECTION  . Cerebral thrombosis with cerebral infarction [place]) JUNE 2011    RIGHT SIDED WEAKNESS ( ARM AND LEG ) AND SPASMS  . Stroke ([place])   . Right rotator cuff tear     PAIN IN RIGHT SHOULDER  . Ventral hernia     RIGHT LOWER QUADRANT-CAUSING SOME PAIN  . Anemia     DURING MENSES--HAS HEAVY BLEEDING WITH PERODS  . Headache(784.0)     MIGRAINES--NOT REALLY HEADACHE-MORE LIKE PRESSURE SENSATION IN HEAD-FEELS DIZZIY AND  FAINT AS THE PRESSURE RESOLVES  . Diabetic neuropathy ([place])     BOTH FEET --COMES AND GOES  . Restless leg syndrome     DIAGNOSED BY SLEEP STUDY - PT TOLD SHE DID NOT HAVE SLEEP APNEA  . Rash     HANDS, ARMS --STATES HX OF RASH EVER SINCE CHILDBIRTH/PREGNANCY.  STATES THE RASH OFTEN OCCURS WHEN SHE IS REALLY STRESSED.  Marland Kitchen Hernia, incisional, RLQ, s/p lap repair Sep 2013 09/02/2011  . SBO (small bowel obstruction) ([place]) 06/03/2012  . Back pain, chronic   . Leg pain, right   . Hx of migraines 10/19/2011   BP 154/85 mmHg  Pulse 86  Resp 16  SpO2 100%  Opioid Risk Score:   Fall Risk Score:  `1  Depression screen PHQ 2/9  No flowsheet data found.   Review of Systems  Constitutional: Positive for diaphoresis.  Gastrointestinal: Positive for nausea, vomiting and abdominal pain.  Endocrine:       High Blood Sugar  Musculoskeletal:       Spasms  Neurological: Positive for dizziness, tremors, weakness and numbness.       Tingling Gait Instabitlity  Psychiatric/Behavioral: The patient is nervous/anxious.        Depression  All other systems reviewed and are negative.      Objective:   Physical Exam  Constitutional: She is oriented to person, place, and time.  Neurological: She is alert and oriented to person, place, and time. Coordination abnormal.  Reflex Scores:      Patellar reflexes are 2+ on the right side and 2+ on the left side.      Achilles reflexes are 2+ on the right side and 2+ on the left side. Decreased fine  motor skills right upper extremity    Motor strength is 4/5 in the right deltoid, biceps, triceps, grip, hip flexor, knee extensor, ankle dorsiflexor and plantar flexor Sensation is intact to light touch in both upper limbs however she does feel the right side feels a little different than the left  Inner lower extremity she has no reduction in sensation to light touch bilaterally. She has some allodynia in the left lower extremity during reflex testing of the right patellar and right Achilles muscle  stretch reflexes.  Tenderness to palpation over the right trochanteric bursa. No tenderness to palpation on the left side. She has mild tenderness palpation along the lumbar paraspinal muscles bilaterally L5-S1. She has some tenderness right greater than left PSIS. No pain with hip range of motion. No pain with knee or ankle range of motion. Negative Homans  Obese     Assessment & Plan:  1. Right hip pain with symptoms and clinics exam consistent with greater trochanter Tendinitis/bursitis. She is not a good candidate for nonsteroidal anti-inflammatories secondary to her history of stroke. Pain does interfere with sleep. Recommend trochanteric bursa injection right side If palpation guided injection is not helpful would recommend ultrasound guided given her body habitus.  Trochanteric bursa injection  without ultrasound guidance  Indication Trochanteric bursitis. Exam has tenderness over the greater trochanter of the hip. Pain has not responded to conservative care such as exercise therapy and oral medications. Pain interferes with sleep or with mobility Informed consent was obtained after describing risks and benefits of the procedure with the patient these include bleeding bruising and infection. Patient has signed written consent form. Patient placed in a lateral decubitus position with the affected hip superior. Point of maximal pain was palpated marked and prepped with Betadine and  entered with a needle to bone contact. Needle slightly withdrawn then 6mg  of betamethasone with 4 cc 1% lidocaine were injected. Patient tolerated procedure well. Post procedure instructions given.  2. Low back pain with exam findings most consistent with sacroiliac disorder. She does have history of hemiplegic gait pattern and this certainly could be a predisposing factor. We'll schedule for sacroiliac injection. If her symptoms resolve with the trochanteric bursa injection will be able to cancel. If pain persists despite medications, would refer to physical therapy  3. Chronic hemiplegic shoulder pain continue tramadol 1 tablet twice a day  4. Residual right spastic hemiparesis, mild, continue Zanaflex for spasticity

## 2014-11-24 DIAGNOSIS — E118 Type 2 diabetes mellitus with unspecified complications: Secondary | ICD-10-CM | POA: Diagnosis not present

## 2014-11-24 DIAGNOSIS — M549 Dorsalgia, unspecified: Secondary | ICD-10-CM | POA: Diagnosis not present

## 2014-11-24 DIAGNOSIS — G629 Polyneuropathy, unspecified: Secondary | ICD-10-CM | POA: Diagnosis not present

## 2014-11-24 DIAGNOSIS — I10 Essential (primary) hypertension: Secondary | ICD-10-CM | POA: Diagnosis not present

## 2014-11-28 ENCOUNTER — Ambulatory Visit: Payer: Medicare Other | Admitting: Physical Medicine & Rehabilitation

## 2014-11-28 DIAGNOSIS — E11319 Type 2 diabetes mellitus with unspecified diabetic retinopathy without macular edema: Secondary | ICD-10-CM | POA: Diagnosis not present

## 2014-11-28 DIAGNOSIS — H538 Other visual disturbances: Secondary | ICD-10-CM | POA: Diagnosis not present

## 2014-11-28 DIAGNOSIS — H2511 Age-related nuclear cataract, right eye: Secondary | ICD-10-CM | POA: Diagnosis not present

## 2014-11-29 DIAGNOSIS — H359 Unspecified retinal disorder: Secondary | ICD-10-CM | POA: Diagnosis not present

## 2014-11-29 DIAGNOSIS — H2511 Age-related nuclear cataract, right eye: Secondary | ICD-10-CM | POA: Diagnosis not present

## 2014-11-29 DIAGNOSIS — E113513 Type 2 diabetes mellitus with proliferative diabetic retinopathy with macular edema, bilateral: Secondary | ICD-10-CM | POA: Diagnosis not present

## 2014-11-29 DIAGNOSIS — H2512 Age-related nuclear cataract, left eye: Secondary | ICD-10-CM | POA: Diagnosis not present

## 2014-11-29 DIAGNOSIS — H4311 Vitreous hemorrhage, right eye: Secondary | ICD-10-CM | POA: Diagnosis not present

## 2014-12-03 ENCOUNTER — Other Ambulatory Visit: Payer: Self-pay | Admitting: Physical Medicine & Rehabilitation

## 2014-12-12 ENCOUNTER — Ambulatory Visit: Payer: Federal, State, Local not specified - PPO | Admitting: Physical Medicine & Rehabilitation

## 2014-12-12 ENCOUNTER — Encounter: Payer: Federal, State, Local not specified - PPO | Attending: Physical Medicine & Rehabilitation

## 2014-12-12 DIAGNOSIS — K439 Ventral hernia without obstruction or gangrene: Secondary | ICD-10-CM | POA: Insufficient documentation

## 2014-12-12 DIAGNOSIS — D62 Acute posthemorrhagic anemia: Secondary | ICD-10-CM | POA: Insufficient documentation

## 2014-12-12 DIAGNOSIS — E113512 Type 2 diabetes mellitus with proliferative diabetic retinopathy with macular edema, left eye: Secondary | ICD-10-CM | POA: Diagnosis not present

## 2014-12-12 DIAGNOSIS — F329 Major depressive disorder, single episode, unspecified: Secondary | ICD-10-CM | POA: Insufficient documentation

## 2014-12-12 DIAGNOSIS — M533 Sacrococcygeal disorders, not elsewhere classified: Secondary | ICD-10-CM | POA: Insufficient documentation

## 2014-12-12 DIAGNOSIS — E1140 Type 2 diabetes mellitus with diabetic neuropathy, unspecified: Secondary | ICD-10-CM | POA: Insufficient documentation

## 2014-12-12 DIAGNOSIS — F410 Panic disorder [episodic paroxysmal anxiety] without agoraphobia: Secondary | ICD-10-CM | POA: Insufficient documentation

## 2014-12-12 DIAGNOSIS — I639 Cerebral infarction, unspecified: Secondary | ICD-10-CM | POA: Insufficient documentation

## 2014-12-12 DIAGNOSIS — I10 Essential (primary) hypertension: Secondary | ICD-10-CM | POA: Insufficient documentation

## 2014-12-12 DIAGNOSIS — M7061 Trochanteric bursitis, right hip: Secondary | ICD-10-CM | POA: Insufficient documentation

## 2014-12-12 DIAGNOSIS — Z79899 Other long term (current) drug therapy: Secondary | ICD-10-CM | POA: Insufficient documentation

## 2014-12-12 DIAGNOSIS — G43909 Migraine, unspecified, not intractable, without status migrainosus: Secondary | ICD-10-CM | POA: Insufficient documentation

## 2014-12-12 DIAGNOSIS — I69351 Hemiplegia and hemiparesis following cerebral infarction affecting right dominant side: Secondary | ICD-10-CM | POA: Insufficient documentation

## 2014-12-12 DIAGNOSIS — I669 Occlusion and stenosis of unspecified cerebral artery: Secondary | ICD-10-CM | POA: Insufficient documentation

## 2015-01-09 ENCOUNTER — Other Ambulatory Visit: Payer: Self-pay | Admitting: Physical Medicine & Rehabilitation

## 2015-02-02 ENCOUNTER — Encounter (HOSPITAL_COMMUNITY): Payer: Self-pay | Admitting: Emergency Medicine

## 2015-02-02 ENCOUNTER — Emergency Department (HOSPITAL_COMMUNITY)
Admission: EM | Admit: 2015-02-02 | Discharge: 2015-02-03 | Disposition: A | Discharge status: Home / Self Care | Payer: Federal, State, Local not specified - PPO | Attending: Emergency Medicine | Admitting: Emergency Medicine

## 2015-02-02 DIAGNOSIS — Z86018 Personal history of other benign neoplasm: Secondary | ICD-10-CM | POA: Diagnosis not present

## 2015-02-02 DIAGNOSIS — Z9889 Other specified postprocedural states: Secondary | ICD-10-CM | POA: Diagnosis not present

## 2015-02-02 DIAGNOSIS — F329 Major depressive disorder, single episode, unspecified: Secondary | ICD-10-CM | POA: Diagnosis not present

## 2015-02-02 DIAGNOSIS — R1031 Right lower quadrant pain: Secondary | ICD-10-CM | POA: Insufficient documentation

## 2015-02-02 DIAGNOSIS — G2581 Restless legs syndrome: Secondary | ICD-10-CM | POA: Diagnosis not present

## 2015-02-02 DIAGNOSIS — I252 Old myocardial infarction: Secondary | ICD-10-CM | POA: Insufficient documentation

## 2015-02-02 DIAGNOSIS — G8929 Other chronic pain: Secondary | ICD-10-CM | POA: Insufficient documentation

## 2015-02-02 DIAGNOSIS — I10 Essential (primary) hypertension: Secondary | ICD-10-CM | POA: Diagnosis not present

## 2015-02-02 DIAGNOSIS — Z862 Personal history of diseases of the blood and blood-forming organs and certain disorders involving the immune mechanism: Secondary | ICD-10-CM | POA: Insufficient documentation

## 2015-02-02 DIAGNOSIS — F419 Anxiety disorder, unspecified: Secondary | ICD-10-CM | POA: Diagnosis not present

## 2015-02-02 DIAGNOSIS — Z8719 Personal history of other diseases of the digestive system: Secondary | ICD-10-CM | POA: Insufficient documentation

## 2015-02-02 DIAGNOSIS — Z3202 Encounter for pregnancy test, result negative: Secondary | ICD-10-CM | POA: Insufficient documentation

## 2015-02-02 DIAGNOSIS — Y829 Unspecified medical devices associated with adverse incidents: Secondary | ICD-10-CM | POA: Diagnosis not present

## 2015-02-02 DIAGNOSIS — T888XXS Other specified complications of surgical and medical care, not elsewhere classified, sequela: Secondary | ICD-10-CM | POA: Diagnosis not present

## 2015-02-02 DIAGNOSIS — K91873 Postprocedural seroma of a digestive system organ or structure following other procedure: Secondary | ICD-10-CM | POA: Diagnosis not present

## 2015-02-02 DIAGNOSIS — T792XXS Traumatic secondary and recurrent hemorrhage and seroma, sequela: Secondary | ICD-10-CM

## 2015-02-02 DIAGNOSIS — Z8673 Personal history of transient ischemic attack (TIA), and cerebral infarction without residual deficits: Secondary | ICD-10-CM | POA: Diagnosis not present

## 2015-02-02 DIAGNOSIS — Z7982 Long term (current) use of aspirin: Secondary | ICD-10-CM | POA: Insufficient documentation

## 2015-02-02 DIAGNOSIS — G43909 Migraine, unspecified, not intractable, without status migrainosus: Secondary | ICD-10-CM | POA: Insufficient documentation

## 2015-02-02 DIAGNOSIS — Z794 Long term (current) use of insulin: Secondary | ICD-10-CM | POA: Insufficient documentation

## 2015-02-02 DIAGNOSIS — R6883 Chills (without fever): Secondary | ICD-10-CM | POA: Insufficient documentation

## 2015-02-02 DIAGNOSIS — Z79899 Other long term (current) drug therapy: Secondary | ICD-10-CM | POA: Diagnosis not present

## 2015-02-02 DIAGNOSIS — IMO0001 Reserved for inherently not codable concepts without codable children: Secondary | ICD-10-CM

## 2015-02-02 DIAGNOSIS — R110 Nausea: Secondary | ICD-10-CM | POA: Diagnosis not present

## 2015-02-02 DIAGNOSIS — Z91040 Latex allergy status: Secondary | ICD-10-CM | POA: Insufficient documentation

## 2015-02-02 DIAGNOSIS — E119 Type 2 diabetes mellitus without complications: Secondary | ICD-10-CM | POA: Insufficient documentation

## 2015-02-02 DIAGNOSIS — R1084 Generalized abdominal pain: Secondary | ICD-10-CM | POA: Diagnosis present

## 2015-02-02 LAB — CBC
HCT: 34.5 % — ABNORMAL LOW (ref 36.0–46.0)
HEMOGLOBIN: 11.3 g/dL — AB (ref 12.0–15.0)
MCH: 26.6 pg (ref 26.0–34.0)
MCHC: 32.8 g/dL (ref 30.0–36.0)
MCV: 81.2 fL (ref 78.0–100.0)
PLATELETS: 312 10*3/uL (ref 150–400)
RBC: 4.25 MIL/uL (ref 3.87–5.11)
RDW: 13.9 % (ref 11.5–15.5)
WBC: 7.1 10*3/uL (ref 4.0–10.5)

## 2015-02-02 LAB — COMPREHENSIVE METABOLIC PANEL
ALBUMIN: 3.4 g/dL — AB (ref 3.5–5.0)
ALK PHOS: 46 U/L (ref 38–126)
ALT: 16 U/L (ref 14–54)
AST: 17 U/L (ref 15–41)
Anion gap: 10 (ref 5–15)
BUN: 17 mg/dL (ref 6–20)
CALCIUM: 9.2 mg/dL (ref 8.9–10.3)
CHLORIDE: 105 mmol/L (ref 101–111)
CO2: 24 mmol/L (ref 22–32)
Creatinine, Ser: 1 mg/dL (ref 0.44–1.00)
GFR calc Af Amer: >60 mL/min (ref >60)
GFR calc non Af Amer: >60 mL/min (ref >60)
Glucose, Bld: 281 mg/dL — ABNORMAL HIGH (ref 65–99)
Potassium: 4.3 mmol/L (ref 3.5–5.1)
SODIUM: 139 mmol/L (ref 135–145)
Total Bilirubin: 0.3 mg/dL (ref 0.3–1.2)
Total Protein: 6.9 g/dL (ref 6.5–8.1)

## 2015-02-02 LAB — URINALYSIS, ROUTINE W REFLEX MICROSCOPIC
Bilirubin Urine: NEGATIVE
Glucose, UA: >1000 mg/dL — AB
Ketones, ur: NEGATIVE mg/dL
LEUKOCYTES UA: NEGATIVE
Nitrite: NEGATIVE
PROTEIN: NEGATIVE mg/dL
Specific Gravity, Urine: 1.016 (ref 1.005–1.030)
pH: 5.5 (ref 5.0–8.0)

## 2015-02-02 LAB — URINE MICROSCOPIC-ADD ON

## 2015-02-02 LAB — POC URINE PREG, ED: Preg Test, Ur: NEGATIVE

## 2015-02-02 LAB — LIPASE, BLOOD: Lipase: 41 U/L (ref 11–51)

## 2015-02-02 MED ORDER — IBUPROFEN 200 MG PO TABS
600.0000 mg | ORAL_TABLET | Freq: Once | ORAL | Status: AC
  Administered 2015-02-03: 600 mg via ORAL
  Filled 2015-02-02: qty 3

## 2015-02-02 MED ORDER — BARIUM SULFATE 2.1 % PO SUSP
ORAL | Status: DC
Start: 2015-02-02 — End: 2015-02-03
  Filled 2015-02-02: qty 2

## 2015-02-02 MED ORDER — OXYCODONE-ACETAMINOPHEN 5-325 MG PO TABS
2.0000 | ORAL_TABLET | Freq: Once | ORAL | Status: AC
  Administered 2015-02-03: 2 via ORAL
  Filled 2015-02-02: qty 2

## 2015-02-02 NOTE — ED Notes (Addendum)
C/o pelvic pain and lower back pain since this morning.  States pain initially started as R pelvic pain this morning but now feels like something is squeezing her uterus.    Also reports nausea.  Denies urinary complaints.  Started menstrual cycle today but it is 1 week late.

## 2015-02-02 NOTE — ED Provider Notes (Signed)
CSN: [id number]   Arrival date & time 02/02/15 1904  History  By signing my name below, I, Altamease Oiler, attest that this documentation has been prepared under the direction and in the presence of Jola Schmidt, MD. Electronically Signed: Altamease Oiler, ED Scribe. 02/03/2015. 12:27 AM.  Chief Complaint  Patient presents with  . Pelvic Pain    HPI The history is provided by the patient. No language interpreter was used.   Tina [name] is a 45 y.o. female with history of RLQ ventral hernia who presents to the Emergency Department complaining of sharp/burning, 10/10 in severity, right lower abdominal/pelvic pain with gradual onset 1 week ago. She has had 2 procedures to drain the fluid at her RLQ hernia and states that it feels like the area is filling with fluid again.  Pt states that she started her period this morning and also having diffuse abdominal cramping that is worse than usual. The diffuse cramping improved after vaginally passing a large blood clot. Tramadol provided insufficient pain relief at home. Associated symptoms include nausea and chills. Pt denies fever and vomiting. No history of kidney stones. Surgical history is significant for 2 hernia repairs and uterine fibroid surgery.   Past Medical History  Diagnosis Date  . Spastic hemiplegia affecting dominant side ([place])   . Panic disorder without agoraphobia   . Depression   . Anxiety   . Diabetes mellitus   . Hypertension   . Blood transfusion     IN 2012  AFTER C -SECTION  . Cerebral thrombosis with cerebral infarction [facility]) JUNE 2011    RIGHT SIDED WEAKNESS ( ARM AND LEG ) AND SPASMS  . Stroke ([place])   . Right rotator cuff tear     PAIN IN RIGHT SHOULDER  . Ventral hernia     RIGHT LOWER QUADRANT-CAUSING SOME PAIN  . Anemia     DURING MENSES--HAS HEAVY BLEEDING WITH PERODS  . Headache(784.0)     MIGRAINES--NOT REALLY HEADACHE-MORE LIKE PRESSURE SENSATION IN HEAD-FEELS DIZZIY AND  FAINT AS THE PRESSURE  RESOLVES  . Diabetic neuropathy ([place])     BOTH FEET --COMES AND GOES  . Restless leg syndrome     DIAGNOSED BY SLEEP STUDY - PT TOLD SHE DID NOT HAVE SLEEP APNEA  . Rash     HANDS, ARMS --STATES HX OF RASH EVER SINCE CHILDBIRTH/PREGNANCY.  STATES THE RASH OFTEN OCCURS WHEN SHE IS REALLY STRESSED.  Marland Kitchen Hernia, incisional, RLQ, s/p lap repair Sep 2013 09/02/2011  . SBO (small bowel obstruction) ([place]) 06/03/2012  . Back pain, chronic   . Leg pain, right   . Hx of migraines 10/19/2011    Past Surgical History  Procedure Laterality Date  . Cesarean section  2012  . Uterine fibroid surgery      2 SURGERIES FOR FIBROIDS  . Ureter revision    . Ventral hernia repair  10/03/2011    Procedure: LAPAROSCOPIC VENTRAL HERNIA;  Surgeon: Adin Hector, MD;  Location: [facility]S;  Service: General;  Laterality: N/A;  . Hernia repair  10/03/11    ventral hernia repair  . Esophagogastroduodenoscopy N/A 06/03/2012    Procedure: ESOPHAGOGASTRODUODENOSCOPY (EGD);  Surgeon: [name] Craver, MD;  Location: [facility] ENDOSCOPY;  Service: Endoscopy;  Laterality: N/A;  . Diagnostic laparoscopy    . Umbilical hernia repair N/A 02/04/2013    Procedure: LAPAROSCOPIC ventral wall hernia repair LAPAROSCOPIC LYSIS OF ADHESIONS laparoscopic exploration of abdomen ;  Surgeon: Adin Hector, MD;  Location: [facility] ORS;  Service:  General;  Laterality: N/A;  . Insertion of mesh N/A 02/04/2013    Procedure: INSERTION OF MESH;  Surgeon: Adin Hector, MD;  Location: [facility]S;  Service: General;  Laterality: N/A;    Family History  Problem Relation Age of Onset  . Diabetes Father   . Kidney disease Father   . Other Neg Hx   . Diabetes Maternal Grandmother   . Hyperlipidemia Paternal Grandmother   . Stroke Paternal Grandmother     Social History  Substance Use Topics  . Smoking status: Never Smoker   . Smokeless tobacco: Never Used  . Alcohol Use: No     Review of Systems 10 Systems reviewed and all are negative for acute change except  as noted in the HPI. Home Medications   Prior to Admission medications   Medication Sig Start Date End Date Taking? Authorizing Provider  allopurinol (ZYLOPRIM) 100 MG tablet Take 100 mg by mouth every evening. 03/13/14   Historical Provider, MD  Amlodipine-Valsartan-HCTZ [id number] MG TABS Take 1 tablet by mouth every evening.  01/09/13   Historical Provider, MD  aspirin 325 MG tablet Take 325 mg by mouth at bedtime.     Historical Provider, MD  Biotin 5000 MCG TABS Take 1 tablet by mouth daily.    Historical Provider, MD  BYSTOLIC 10 MG tablet Take 10 mg by mouth every morning.  01/05/12   Historical Provider, MD  colchicine 0.6 MG tablet Take 0.6 mg by mouth as needed. For gout flareup 02/14/14   Historical Provider, MD  furosemide (LASIX) 40 MG tablet Take 20-40 mg by mouth daily as needed for fluid or edema.     Historical Provider, MD  gabapentin (NEURONTIN) 300 MG capsule Take 1 capsule (300 mg total) by mouth 3 (three) times daily. 09/05/14   [name], MD  insulin glargine (LANTUS) 100 UNIT/ML injection Inject 10-15 Units into the skin 2 (two) times daily. 15 units in the morning and 10 mg in the evening    Historical Provider, MD  Liraglutide (VICTOZA) 18 MG/3ML SOPN Inject 1.2 mg into the skin daily.     Historical Provider, MD  NOVOLOG FLEXPEN 100 UNIT/ML FlexPen Inject 5-12 Units into the skin 3 (three) times daily with meals.  12/14/13   Historical Provider, MD  ondansetron (ZOFRAN) 4 MG tablet Take 1 tablet (4 mg total) by mouth every 6 (six) hours as needed for nausea. 03/08/14   Shanker Kristeen Mans, MD  Prenatal Vit-Fe Fumarate-FA (PRENATAL MULTIVITAMIN) TABS tablet Take 1 tablet by mouth every evening.     Historical Provider, MD  saccharomyces boulardii (FLORASTOR) 250 MG capsule Take 1 capsule (250 mg total) by mouth 2 (two) times daily. 03/08/14   Shanker Kristeen Mans, MD  tiZANidine (ZANAFLEX) 4 MG tablet TAKE 1 TABLET (4 MG TOTAL) BY MOUTH 2 (TWO) TIMES DAILY. 11/09/14   [name], MD  tiZANidine (ZANAFLEX) 4 MG tablet TAKE 1 TABLET (4 MG TOTAL) BY MOUTH 2 (TWO) TIMES DAILY. 12/04/14   [name], MD  traMADol (ULTRAM) 50 MG tablet Take 1 tablet (50 mg total) by mouth every 12 (twelve) hours as needed. 11/09/14   [name], MD    Allergies  Contrast media; Iohexol; Midazolam hcl; Shellfish allergy; Metformin and related; Other; Avandia; Geodon; Kiwi extract; and Latex  Triage Vitals: BP 145/78 mmHg  Pulse 96  Temp(Src) 98 F (36.7 C) (Oral)  Resp 20  Ht 5\' 6"  (1.676 m)  Wt 246 lb (111.585  kg)  BMI 39.72 kg/m2  SpO2 100%  LMP 02/02/2015  Physical Exam  Constitutional: She is oriented to person, place, and time. She appears well-developed and well-nourished. No distress.  HENT:  Head: Normocephalic and atraumatic.  Eyes: EOM are normal.  Neck: Normal range of motion.  Cardiovascular: Normal rate, regular rhythm and normal heart sounds.   Pulmonary/Chest: Effort normal and breath sounds normal.  Abdominal: Soft. She exhibits mass. She exhibits no distension. There is no tenderness.  Palpable soft tissue mass in the RLQ. No overlying erythema or induration.  No obvious fluctuance.   Musculoskeletal: Normal range of motion.  Neurological: She is alert and oriented to person, place, and time.  Skin: Skin is warm and dry.  Psychiatric: She has a normal mood and affect. Judgment normal.  Nursing note and vitals reviewed.   ED Course  Procedures   DIAGNOSTIC STUDIES: Oxygen Saturation is 100% on RA, normal by my interpretation.    COORDINATION OF CARE: 11:34 PM Discussed treatment plan which includes lab work, CT A/P, Percocet, and ibuprofen with pt at bedside and pt agreed to plan.  Labs Reviewed  COMPREHENSIVE METABOLIC PANEL - Abnormal; Notable for the following:    Glucose, Bld 281 (*)    Albumin 3.4 (*)    All other components within normal limits  CBC - Abnormal; Notable for the following:    Hemoglobin 11.3 (*)     HCT 34.5 (*)    All other components within normal limits  URINALYSIS, ROUTINE W REFLEX MICROSCOPIC (NOT AT [facility]) - Abnormal; Notable for the following:    Glucose, UA >1000 (*)    Hgb urine dipstick MODERATE (*)    All other components within normal limits  URINE MICROSCOPIC-ADD ON - Abnormal; Notable for the following:    Squamous Epithelial / LPF 0-5 (*)    Bacteria, UA RARE (*)    All other components within normal limits  LIPASE, BLOOD  POC URINE PREG, ED    Imaging Review Ct Abdomen Pelvis Wo Contrast  02/03/2015  CLINICAL DATA:  Acute onset of worsening right lower quadrant abdominal pain. Nausea. Initial encounter. EXAM: CT ABDOMEN AND PELVIS WITHOUT CONTRAST TECHNIQUE: Multidetector CT imaging of the abdomen and pelvis was performed following the standard protocol without IV contrast. COMPARISON:  CT of the abdomen and pelvis performed 08/18/2014 FINDINGS: The visualized lung bases are clear. Diffuse fatty infiltration is noted within the liver. The liver and spleen are otherwise unremarkable. The gallbladder is within normal limits. The pancreas and adrenal glands are unremarkable. The kidneys are unremarkable in appearance. There is no evidence of hydronephrosis. No renal or ureteral stones are seen. Mild nonspecific perinephric stranding is noted bilaterally. No free fluid is identified. The small bowel is unremarkable in appearance. The stomach is within normal limits. No acute vascular abnormalities are seen. The appendix is normal in caliber and contains air, without evidence of appendicitis. Contrast progresses to the level of the ascending colon. The colon is unremarkable in appearance. The bladder is moderately distended and grossly unremarkable. The uterus contains multiple fibroids. The ovaries are grossly symmetric. No suspicious adnexal masses are seen. No inguinal lymphadenopathy is seen. A collection of fluid is again noted along the right lower quadrant anterior abdominal  wall, measuring approximately 9.0 x 8.1 x 6.8 cm. This extends across the fascia into the space of Retzius, abutting the bladder, but appears to be separate from the bladder. Mild chronic soft tissue inflammation is noted along the right anterior abdominal  wall overlying the fascia. A large anterior abdominal wall mesh is noted. No acute osseous abnormalities are identified. IMPRESSION: 1. Collection of fluid again noted along the right lower quadrant anterior abdominal wall, measuring 9.0 x 8.1 x 6.8 cm. This appears to have increased mildly in size from the prior study. It extends across the fascia into the space of Retzius. 2. Soft tissue inflammation noted along the right anterior abdominal wall overlying the fascia, similar in appearance to the prior study. 3. Diffuse fatty infiltration within the liver. 4. Multiple fibroids noted within the uterus. Electronically Signed   By: Garald Balding M.D.   On: 02/03/2015 02:27    I personally reviewed and evaluated these images and lab results as a part of my medical decision-making.    MDM   Final diagnoses:  None    Patient's had mild increased size of the ongoing now chronic fluid collection in the right lower quadrant.  This likely is causing discomfort.  This appears to be where her focal pain is.  Given the distention of the soft tissues on meds and this causes pain.  I referred her back to her general surgery team.  Of also referred her to [place]. surgery if she feels as though she is not receiving appropriate care or answers from her current team.  This is been sterile when drained before in the past and I suspect this remained sterile.  No fever or white count.  I personally performed the services described in this documentation, which was scribed in my presence. The recorded information has been reviewed and is accurate.       Jola Schmidt, MD 02/03/15 [phone]

## 2015-02-03 ENCOUNTER — Emergency Department (HOSPITAL_COMMUNITY): Payer: Federal, State, Local not specified - PPO

## 2015-02-03 DIAGNOSIS — R1031 Right lower quadrant pain: Secondary | ICD-10-CM | POA: Diagnosis not present

## 2015-02-03 MED ORDER — HYDROCODONE-ACETAMINOPHEN 5-325 MG PO TABS: 1.0000 | ORAL_TABLET | ORAL | Status: DC | PRN

## 2015-02-03 NOTE — ED Notes (Signed)
Pt A&OX4, ambulatory at d/c with steady gait, NAD and states she has all of her belongings with her at d/c 

## 2015-02-26 DIAGNOSIS — E113512 Type 2 diabetes mellitus with proliferative diabetic retinopathy with macular edema, left eye: Secondary | ICD-10-CM | POA: Diagnosis not present

## 2015-02-26 DIAGNOSIS — E113511 Type 2 diabetes mellitus with proliferative diabetic retinopathy with macular edema, right eye: Secondary | ICD-10-CM | POA: Diagnosis not present

## 2015-02-26 DIAGNOSIS — H4311 Vitreous hemorrhage, right eye: Secondary | ICD-10-CM | POA: Diagnosis not present

## 2015-02-26 DIAGNOSIS — H2511 Age-related nuclear cataract, right eye: Secondary | ICD-10-CM | POA: Diagnosis not present

## 2015-02-26 DIAGNOSIS — H35351 Cystoid macular degeneration, right eye: Secondary | ICD-10-CM | POA: Diagnosis not present

## 2015-02-26 DIAGNOSIS — H359 Unspecified retinal disorder: Secondary | ICD-10-CM | POA: Diagnosis not present

## 2015-03-08 ENCOUNTER — Encounter: Payer: Self-pay | Admitting: Physical Medicine & Rehabilitation

## 2015-03-08 ENCOUNTER — Ambulatory Visit (HOSPITAL_BASED_OUTPATIENT_CLINIC_OR_DEPARTMENT_OTHER): Payer: Federal, State, Local not specified - PPO | Admitting: Physical Medicine & Rehabilitation

## 2015-03-08 ENCOUNTER — Encounter: Payer: Federal, State, Local not specified - PPO | Attending: Physical Medicine & Rehabilitation

## 2015-03-08 VITALS — BP 130/61 | HR 81

## 2015-03-08 DIAGNOSIS — F329 Major depressive disorder, single episode, unspecified: Secondary | ICD-10-CM | POA: Diagnosis not present

## 2015-03-08 DIAGNOSIS — E1140 Type 2 diabetes mellitus with diabetic neuropathy, unspecified: Secondary | ICD-10-CM | POA: Diagnosis not present

## 2015-03-08 DIAGNOSIS — K439 Ventral hernia without obstruction or gangrene: Secondary | ICD-10-CM | POA: Insufficient documentation

## 2015-03-08 DIAGNOSIS — G43909 Migraine, unspecified, not intractable, without status migrainosus: Secondary | ICD-10-CM | POA: Insufficient documentation

## 2015-03-08 DIAGNOSIS — I10 Essential (primary) hypertension: Secondary | ICD-10-CM | POA: Diagnosis not present

## 2015-03-08 DIAGNOSIS — S336XXA Sprain of sacroiliac joint, initial encounter: Secondary | ICD-10-CM | POA: Diagnosis not present

## 2015-03-08 DIAGNOSIS — F410 Panic disorder [episodic paroxysmal anxiety] without agoraphobia: Secondary | ICD-10-CM | POA: Insufficient documentation

## 2015-03-08 DIAGNOSIS — D62 Acute posthemorrhagic anemia: Secondary | ICD-10-CM | POA: Diagnosis not present

## 2015-03-08 DIAGNOSIS — G8110 Spastic hemiplegia affecting unspecified side: Secondary | ICD-10-CM

## 2015-03-08 DIAGNOSIS — Z79899 Other long term (current) drug therapy: Secondary | ICD-10-CM | POA: Diagnosis not present

## 2015-03-08 DIAGNOSIS — I69351 Hemiplegia and hemiparesis following cerebral infarction affecting right dominant side: Secondary | ICD-10-CM | POA: Diagnosis not present

## 2015-03-08 DIAGNOSIS — I669 Occlusion and stenosis of unspecified cerebral artery: Secondary | ICD-10-CM | POA: Insufficient documentation

## 2015-03-08 DIAGNOSIS — M7061 Trochanteric bursitis, right hip: Secondary | ICD-10-CM | POA: Diagnosis not present

## 2015-03-08 DIAGNOSIS — I639 Cerebral infarction, unspecified: Secondary | ICD-10-CM | POA: Diagnosis not present

## 2015-03-08 DIAGNOSIS — R202 Paresthesia of skin: Secondary | ICD-10-CM | POA: Diagnosis not present

## 2015-03-08 DIAGNOSIS — M533 Sacrococcygeal disorders, not elsewhere classified: Secondary | ICD-10-CM | POA: Insufficient documentation

## 2015-03-08 MED ORDER — BACLOFEN 10 MG PO TABS: 10.0000 mg | ORAL_TABLET | Freq: Two times a day (BID) | ORAL | Status: DC

## 2015-03-08 NOTE — Patient Instructions (Addendum)
Dr. [facility] [address] [place], [place] [address] [phone]  Make appointment to evaluate and treat sacroiliac joint on the right side

## 2015-03-08 NOTE — Progress Notes (Signed)
Subjective:    Patient ID: Tina [name], female    DOB: 05/29/1970, 45 y.o.   MRN: [id number] 45 year old female with history of left pontine infarct in 2011. She underwent inpatient rehabilitation followed by outpatient rehabilitation. She has had right hemiplegic shoulder pain requiring injections as well as medications. The patient regained independent status after rehabilitation and has been able to care for her 3 young children. She ambulates without an assistive device but still has a limp. HPI Chief complaint today is right-sided buttocks pain. She is states that it's actually her hip but with further questioning it appears to be more posterior than anterior. In addition she has spasms in the right calf going into the right ball of foot but also some spasms occasionally in the right medial thigh. She's had no recent falls or trauma. She does not have any new medical issues other than those listed above.  Patient  Has numbness type sensation in the right lower extremity. This is not progressive. This does not at all the right upper extremity.  She has a past history of right shoulder rotator cuff tear which is chronic but this has not been as symptomatic lately.  Patient does not have any increasing weakness on the right side. Pain Inventory Average Pain 7 Pain Right Now 7 My pain is sharp, burning, stabbing and aching  In the last 24 hours, has pain interfered with the following? General activity 8 Relation with others 8 Enjoyment of life 8 What TIME of day is your pain at its worst? varies Sleep (in general) Poor  Pain is worse with: walking, sitting and standing Pain improves with: medication Relief from Meds: 4  Mobility use a cane how many minutes can you walk? 15 ability to climb steps?  yes do you drive?  no  Function not employed: date last employed . I need assistance with the following:  bathing, household duties and  shopping  Neuro/Psych weakness numbness tingling spasms dizziness confusion anxiety  Prior Studies Any changes since last visit?  no  Physicians involved in your care Any changes since last visit?  no   Family History  Problem Relation Age of Onset  . Diabetes Father   . Kidney disease Father   . Other Neg Hx   . Diabetes Maternal Grandmother   . Hyperlipidemia Paternal Grandmother   . Stroke Paternal Grandmother    Social History   Social History  . Marital Status: Married    Spouse Name: N/A  . Number of Children: 2  . Years of Education: BA degree   Occupational History  .     Social History Main Topics  . Smoking status: Never Smoker   . Smokeless tobacco: Never Used  . Alcohol Use: No  . Drug Use: No  . Sexual Activity: Yes    Birth Control/ Protection: None   Other Topics Concern  . None   Social History Narrative   Patient is married with 2 children.   Patient is right handed.   Patient has her  BA degree.   Patient drinks 1 cup daily.   Past Surgical History  Procedure Laterality Date  . Cesarean section  2012  . Uterine fibroid surgery      2 SURGERIES FOR FIBROIDS  . Ureter revision    . Ventral hernia repair  10/03/2011    Procedure: LAPAROSCOPIC VENTRAL HERNIA;  Surgeon: Adin Hector, MD;  Location: [facility];  Service: General;  Laterality: N/A;  . Hernia  repair  10/03/11    ventral hernia repair  . Esophagogastroduodenoscopy N/A 06/03/2012    Procedure: ESOPHAGOGASTRODUODENOSCOPY (EGD);  Surgeon: [name] Craver, MD;  Location: [facility] ENDOSCOPY;  Service: Endoscopy;  Laterality: N/A;  . Diagnostic laparoscopy    . Umbilical hernia repair N/A 02/04/2013    Procedure: LAPAROSCOPIC ventral wall hernia repair LAPAROSCOPIC LYSIS OF ADHESIONS laparoscopic exploration of abdomen ;  Surgeon: Adin Hector, MD;  Location: [facility];  Service: General;  Laterality: N/A;  . Insertion of mesh N/A 02/04/2013    Procedure: INSERTION OF MESH;  Surgeon: Adin Hector, MD;  Location: [facility];  Service: General;  Laterality: N/A;   Past Medical History  Diagnosis Date  . Spastic hemiplegia affecting dominant side ([place])   . Panic disorder without agoraphobia   . Depression   . Anxiety   . Diabetes mellitus   . Hypertension   . Blood transfusion     IN 2012  AFTER C -SECTION  . Cerebral thrombosis with cerebral infarction [facility]) JUNE 2011    RIGHT SIDED WEAKNESS ( ARM AND LEG ) AND SPASMS  . Stroke ([place])   . Right rotator cuff tear     PAIN IN RIGHT SHOULDER  . Ventral hernia     RIGHT LOWER QUADRANT-CAUSING SOME PAIN  . Anemia     DURING MENSES--HAS HEAVY BLEEDING WITH PERODS  . Headache(784.0)     MIGRAINES--NOT REALLY HEADACHE-MORE LIKE PRESSURE SENSATION IN HEAD-FEELS DIZZIY AND  FAINT AS THE PRESSURE RESOLVES  . Diabetic neuropathy ([place])     BOTH FEET --COMES AND GOES  . Restless leg syndrome     DIAGNOSED BY SLEEP STUDY - PT TOLD SHE DID NOT HAVE SLEEP APNEA  . Rash     HANDS, ARMS --STATES HX OF RASH EVER SINCE CHILDBIRTH/PREGNANCY.  STATES THE RASH OFTEN OCCURS WHEN SHE IS REALLY STRESSED.  Marland Kitchen Hernia, incisional, RLQ, s/p lap repair Sep 2013 09/02/2011  . SBO (small bowel obstruction) ([place]) 06/03/2012  . Back pain, chronic   . Leg pain, right   . Hx of migraines 10/19/2011   BP 130/61 mmHg  Pulse 81  SpO2 99%  Opioid Risk Score:   Fall Risk Score:  `1  Depression screen PHQ 2/9  No flowsheet data found.   Review of Systems  Constitutional: Positive for chills, diaphoresis and unexpected weight change.  Cardiovascular: Positive for leg swelling.  Gastrointestinal: Positive for nausea, vomiting, abdominal pain, diarrhea and constipation.  All other systems reviewed and are negative.      Objective:   Physical Exam Tenderness over the right PSIS area. Mild tenderness over the right greater trochanter. No pain with right hip range of motion but does have decreased internal/external rotation bilateral hips. She has full  knee and ankle range of motion passively but she does have tightness of the heel cord on the right side compared to the left side. Deep tendon reflexes 3+ right biceps triceps brachioradialis patella and Achilles. 2 on the left side. Gait she does have mild hip hiking on the right side. Patient has several beats clonus right Achilles area. Mild tenderness over the right calf but no swelling. No swelling over the right thigh. Negative straight leg raising test Sensation is mildly reduced in the right leg compared to the left leg.        Assessment & Plan:  1. Right hip pain appears to be mainly in the buttocks area or posterior hip. Given her chronic gait deviation I suspect  sacroiliac dysfunction. We discussed her treatment options including manual medicine i.e. Chiropractic care versus injections versus doing nothing. I do not think additional physical therapy would be helpful at this time given that she's had extensive physical therapy in the past. I do think chiropractic care would be reasonable and certainly the noninvasive option. If it is not helpful after several weeks I would recommend right sacroiliac injection under fluoroscopic guidance to further delineate.

## 2015-03-12 ENCOUNTER — Telehealth: Payer: Self-pay | Admitting: *Deleted

## 2015-03-12 ENCOUNTER — Encounter (HOSPITAL_COMMUNITY): Payer: Self-pay | Admitting: Emergency Medicine

## 2015-03-12 ENCOUNTER — Emergency Department (HOSPITAL_COMMUNITY)
Admission: EM | Admit: 2015-03-12 | Discharge: 2015-03-12 | Disposition: A | Discharge status: Home / Self Care | Payer: Federal, State, Local not specified - PPO | Attending: Emergency Medicine | Admitting: Emergency Medicine

## 2015-03-12 DIAGNOSIS — E1140 Type 2 diabetes mellitus with diabetic neuropathy, unspecified: Secondary | ICD-10-CM | POA: Insufficient documentation

## 2015-03-12 DIAGNOSIS — I10 Essential (primary) hypertension: Secondary | ICD-10-CM | POA: Insufficient documentation

## 2015-03-12 DIAGNOSIS — R200 Anesthesia of skin: Secondary | ICD-10-CM | POA: Insufficient documentation

## 2015-03-12 DIAGNOSIS — R51 Headache: Secondary | ICD-10-CM | POA: Insufficient documentation

## 2015-03-12 DIAGNOSIS — T428X5A Adverse effect of antiparkinsonism drugs and other central muscle-tone depressants, initial encounter: Secondary | ICD-10-CM | POA: Diagnosis not present

## 2015-03-12 DIAGNOSIS — R0989 Other specified symptoms and signs involving the circulatory and respiratory systems: Secondary | ICD-10-CM | POA: Insufficient documentation

## 2015-03-12 NOTE — Telephone Encounter (Signed)
Called to say that she has been having severe headaches and nausea and throwing up and throat feels like it is tightening up after the baclofen.  I called her back to tell her not to take the medication anymore, and she informed me she is on her way to the ED because her left side of her face is numb and drooping and her BP is 161/100.

## 2015-03-12 NOTE — ED Notes (Signed)
Pt sts started baclofen last night and now having HA and facial numbness and scratchy throat; pt in no distress at present

## 2015-03-20 DIAGNOSIS — R1031 Right lower quadrant pain: Secondary | ICD-10-CM | POA: Diagnosis not present

## 2015-03-20 DIAGNOSIS — T888XXS Other specified complications of surgical and medical care, not elsewhere classified, sequela: Secondary | ICD-10-CM | POA: Diagnosis not present

## 2015-03-20 DIAGNOSIS — G8929 Other chronic pain: Secondary | ICD-10-CM | POA: Diagnosis not present

## 2015-03-20 DIAGNOSIS — T792XXS Traumatic secondary and recurrent hemorrhage and seroma, sequela: Secondary | ICD-10-CM | POA: Diagnosis not present

## 2015-03-21 ENCOUNTER — Other Ambulatory Visit: Payer: Self-pay | Admitting: Obstetrics and Gynecology

## 2015-04-05 ENCOUNTER — Ambulatory Visit: Payer: Federal, State, Local not specified - PPO | Admitting: Physical Medicine & Rehabilitation

## 2015-04-13 ENCOUNTER — Other Ambulatory Visit: Payer: Self-pay | Admitting: Surgery

## 2015-04-13 NOTE — H&P (Signed)
Tina [name] 03/20/2015 9:35 AM Location: [facility] Patient #: [id number] DOB: 1970-04-13 Married / Language: English / Race: Black or African American Female   History of Present Illness   The patient is a 45 year old female who presents with abdominal pain. Pleasant morbidly obese female with chronic pain and other health issues. Developed incisional hernia that was repaired. Develop recurrence. We repaired. Struggled with a postoperative seroma and pain. Seroma aspirated once. Negative for infection. Hesitatant to have any nerve blocks or other interventions. I saw her last in August. She wished to try Tylenol. She still has pain in her right lower quadrant underneath her panniculus. She's been trying to lose weight but seems to get a new problem that requires treatment that counteracts. Had a gout flare on her big toe. Placed on steroid taper. Gained weight back again. She seems more open to the idea of having an intervention done to help control her pain. She is trying to taper off her pain medicines. Trying to avoid narcotics. She is eating well. Occasional loose bowel movements but not severe. Trying to walk and exercise more.  CT scan done revealed significant seroma and old hernia cavity and right lower quadrant. Some fluid between over the new mesh and preperitoneal space as well. She underwent CT-guided drainage. Cultures were negative for infection. Drainage output was high at first but then tapered off to less than 10 cc a day for the past 3 days. Flushed well. CT scan showed resolution of one of the seromas. Drain removed since low volume. Persistent deeper abdominal wall seroma connecting into preperitoneal space. She recalls the radiology discusses about ultrasound-guided drainage of that seroma in the next week.  She missed appointment to have drainage of deeper collection. Developed nausea and vomiting and diarrhea. Concern for  gastroenteritis. Admitted. Went ahead and had one of the collections drained. Did not drain much. Then had worsening pain and irritation at the drain. Drain upsized. Had worsening pain around drain site. I removed it last month. Statrted her on 6 weeks course on doxycycline antibiotics. She felt better.  I discussed if she had worsening symptoms, consider operative drainage or removal even though increased mesh risks. She disappeared.  Patient comes back 9 months from the last visit noting that she still has chronic abdominal pain. Also was some new suprapubic pain and worsening menorrhagia. Concern for worsening fibroids in her uterus. Due to see gynecology again to see if would benefit from a partial hysterectomy given the fact that she cannot take hormonal therapy with her history of stroke. She is intentionally lost some weight but remains morbidly obese.     Procedure (Date: 02/04/2013):  POST-OPERATIVE DIAGNOSIS: Recurrent ventral wall incisional hernia  PROCEDURE: Procedure(s):  LAPAROSCOPIC LYSIS OF ADHESIONS 26min (1/2 case)  LAPAROSCOPIC ventral wall hernia repair  INSERTION OF MESH  SURGEON: Surgeon(s):  Adin Hector, MD  OR FINDINGS: Poor incorporation of central part of mesh with persistent hernia sac. Seromas. No abscess. Gram stain negative for organisms.  8 x 7 recurrent area of hernia. Outer 2/3 rim of old Physiomesh well incorporated.  Dense adhesions of small bowel to central part of mesh. Some incorporation. No fistula.  Type of repair - Laparoscopic underlay repair  Name of mesh - Bard Ventralight dual sided (polypropylene / Seprafilm)  Size of mesh - Length 33 cm, Width 25 cm  Mesh overlap - 7-10 cm  Placement of mesh - Intraperitoneal underlay repair  Anaerobic culture Status: Final result  Visible to patient: This result is viewable by the patient in [place]. Next appt: 08/15/2013 at 02:45 PM in Physical Medicine and Rehabilitation  [name], MD)      3wk ago  Specimen Description ABSCESS ABDOMEN HERNIA [place] Special Requests PATIENT ON FOLLOWING FLAGYL Gram Stain RARE WBC NO ORGANISMS SEEN Performed at [facility] Culture NO ANAEROBES ISOLATED Performed at [facility] Report Status 02/09/2013 FINAL Resulting Agency [facility]     Problem List/Past Medical Adin Hector, MD; 03/20/2015 10:16 AM) ABDOMINAL PAIN, CHRONIC, RIGHT LOWER QUADRANT (R10.31) ABDOMINAL WALL SEROMA, SEQUELA (T88.8XXS) POSTOPERATIVE SEROMA, INITIAL ENCOUNTER (T88.8XXA)  Other Problems Adin Hector, MD; 03/20/2015 10:16 AM) Anxiety Disorder Arthritis Back Pain Cerebrovascular Accident Chest pain Depression Diabetes Mellitus High blood pressure Inguinal Hernia Migraine Headache Transfusion history  Past Surgical History Adin Hector, MD; 03/20/2015 10:16 AM) Colon Polyp Removal - Colonoscopy  Diagnostic Studies History Adin Hector, MD; 03/20/2015 10:16 AM) Colonoscopy 5-10 years ago Mammogram within last year Pap Smear 1-5 years ago  Allergies Marjean Donna, CMA; 03/20/2015 9:35 AM) Shellfish-derived Products Iodinated Contrast Media Iohexol *DIAGNOSTIC PRODUCTS* Midazolam *CHEMICALS* Avandia *ANTIDIABETICS* Geodon *ANTIPSYCHOTICS/ANTIMANIC AGENTS* KIWI FRUIT MetFORMIN HCl *ANTIDIABETICS*  Medication History (Sonya Bynum, CMA; 03/20/2015 9:36 AM) Doxycycline Hyclate (100MG  Tablet, 1 (one) Tablet Tablet Oral two times daily, Taken starting 03/23/2014) Active. HYDROmorphone HCl (2MG  Tablet, Oral) Active. TraMADol HCl (50MG  Tablet, Oral) Active. Lyrica (75MG  Capsule, Oral) Active. Advocate Control Solution (Low Liquid, In Vitro) Active. Advocate Redi-Code+ Active. Pharmacist Choice Alcohol Active. Amlodipine-Valsartan-HCTZ (10-320-25MG  Tablet, Oral) Active. Amoxicillin (875MG  Tablet, Oral) Active. BD Pen Needle Short U/F (31G X 8 MM Misc,)  Active. Bystolic (10MG  Tablet, Oral) Active. Cephalexin (500MG  Capsule, Oral) Active. Chlorhexidine Gluconate (0.12% Solution, Mouth/Throat) Active. Colchicine (0.6MG  Tablet, Oral) Active. HumaLOG KwikPen (100UNIT/ML Soln Pen-inj, Subcutaneous) Active. Furosemide (40MG  Tablet, Oral) Active. Indomethacin ER (75MG  Capsule ER, Oral) Active. Ibuprofen (800MG  Tablet, Oral prn) Active. Invokana (300MG  Tablet, Oral) Active. Lantus SoloStar (100UNIT/ML Soln Pen-inj, Subcutaneous) Active. NovoLOG FlexPen (100UNIT/ML Soln Pen-inj, Subcutaneous) Active. OneTouch Ultra Blue (In Vitro) Active. Pharmacist Choice Lancets Active. TiZANidine HCl (4MG  Tablet, Oral) Active. Victoza (18MG /3ML Soln Pen-inj, Subcutaneous) Active. PredniSONE (10MG  Tablet, Oral) Active. Medications Reconciled  Social History Adin Hector, MD; 03/20/2015 10:16 AM) Alcohol use Remotely quit alcohol use. Caffeine use Coffee. No drug use Tobacco use Never smoker.  Family History Adin Hector, MD; 03/20/2015 10:16 AM) Alcohol Abuse Mother, Sister. Depression Mother, Sister. Diabetes Mellitus Father. Kidney Disease Father. Migraine Headache Mother, Sister.  Pregnancy / Birth History Adin Hector, MD; 03/20/2015 10:16 AM) Age at menarche 6 years. Gravida 2 Maternal age 46-20 Para 1 Regular periods    Review of Systems Adin Hector, MD; 03/20/2015 10:20 AM) General Present- Chills and Fatigue. Not Present- Appetite Loss, Fever, Night Sweats, Weight Gain and Weight Loss. Skin Present- Rash. Not Present- Change in Wart/Mole, Dryness, Hives, Jaundice, New Lesions, Non-Healing Wounds and Ulcer. HEENT Present- Ringing in the Ears, Seasonal Allergies and Wears glasses/contact lenses. Not Present- Earache, Hearing Loss, Hoarseness, Nose Bleed, Oral Ulcers, Sinus Pain, Sore Throat, Visual Disturbances and Yellow Eyes. Respiratory Present- Difficulty Breathing. Not Present- Bloody sputum,  Chronic Cough, Snoring and Wheezing. Breast Present- Breast Pain. Not Present- Breast Mass, Nipple Discharge and Skin Changes. Cardiovascular Present- Leg Cramps, Rapid Heart Rate and Swelling of Extremities. Not Present- Chest Pain, Difficulty Breathing Lying Down, Palpitations and Shortness of Breath. Gastrointestinal Present- Abdominal Pain, Bloating, Constipation, Nausea and Vomiting. Not Present- Bloody Stool, Change  in Bowel Habits, Chronic diarrhea, Difficulty Swallowing, Excessive gas, Gets full quickly at meals, Hemorrhoids, Indigestion and Rectal Pain. Musculoskeletal Present- Back Pain. Not Present- Joint Pain, Joint Stiffness, Muscle Pain, Muscle Weakness and Swelling of Extremities. Neurological Present- Headaches and Weakness. Not Present- Decreased Memory, Fainting, Numbness, Seizures, Tingling, Tremor and Trouble walking. Psychiatric Present- Anxiety and Depression. Not Present- Bipolar, Change in Sleep Pattern, Fearful and Frequent crying. Endocrine Present- Hair Changes. Not Present- Cold Intolerance, Excessive Hunger, Heat Intolerance, Hot flashes and New Diabetes.  Vitals (Sonya Bynum CMA; 03/20/2015 9:35 AM) 03/20/2015 9:35 AM Weight: 239 lb Height: 66in Body Surface Area: 2.16 m Body Mass Index: 38.58 kg/m  Temp.: 25F(Temporal)  Pulse: 76 (Regular)  BP: 132/74 (Sitting, Left Arm, Standard)       Physical Exam Adin Hector MD; 03/20/2015 1:09 PM) General Mental Status-Alert. General Appearance-Not in acute distress. Voice-Normal.  Integumentary Global Assessment Normal Exam - Distribution of scalp and body hair is normal. General Characteristics Overall Skin Surface - no rashes and no suspicious lesions.  Head and Neck Head-normocephalic, atraumatic with no lesions or palpable masses. Face Global Assessment - atraumatic, no absence of expression. Neck Global Assessment - no abnormal movements, no decreased range of  motion. Trachea-midline. Thyroid Gland Characteristics - non-tender.  Eye Eyeball - Left-Extraocular movements intact, No Nystagmus. Eyeball - Right-Extraocular movements intact, No Nystagmus. Upper Eyelid - Left-No Cyanotic. Upper Eyelid - Right-No Cyanotic. Note: Again mildly droopy eyes but alert.   Chest and Lung Exam Inspection Accessory muscles - No use of accessory muscles in breathing.  Abdomen Note: Morbidly obese but soft. Mild/moderate soreness and fullness in RIGHT lower quadrant over lower panniculus involving most of that RIGHT side. No evidence of hernia recurrence   Peripheral Vascular Upper Extremity Inspection - Left - Not Gangrenous, No Petechiae. Right - Not Gangrenous, No Petechiae.  Neurologic Neurologic evaluation reveals -normal attention span and ability to concentrate, able to name objects and repeat phrases. Appropriate fund of knowledge and normal coordination.  Neuropsychiatric Mental status exam performed with findings of-able to articulate well with normal speech/language, rate, volume and coherence and no evidence of hallucinations, delusions, obsessions or homicidal/suicidal ideation. Orientation-oriented X3.  Musculoskeletal Global Assessment Gait and Station - normal gait and station.  Lymphatic General Lymphatics Description - No Generalized lymphadenopathy.    Assessment & Plan  ABDOMINAL PAIN, CHRONIC, RIGHT LOWER QUADRANT (R10.31) Impression: I suspect this is probably due to persistent seroma. Superficial pocket been able to be drained but deeper pocket not so much. Been an issue of having compliance with drain replacement and antibiotic use.  I'm concerned that the deep pocket is too loculated to drain. Percutaneous drainage has failed. I'm concerned that she may need more aggressive debridement with operation and loculation breakup with open wound or new surgical drain. Obvious concern is that the mesh could  get infected with this.  At this point, I can offer her lower panniculectomy including the seroma to help simplify the skin in the region. Get rid of the seroma since it seems to cause pain with distention. Trying close it with an incisional wound VAC. I could try diagnostic laparoscopy, but would like to hold off to avoid any bowel injury (numerous prior surgeries with giant mesh. If there is a hysterectomy planned in the future, it could be a combined case.  At this point, I will hold off on final surgical plans until the patient states gynecology to see with the plan as. She will let me know.  There is no strong evidence of recurrent hernia at this time. She already has a pain specialist that she is following. Current Plans Pt Education - [facility] Free Text Education/Instructions: discussed with patient and provided information. Pt Education - [facility] (Kalesha Irving)  ABDOMINAL WALL SEROMA, SEQUELA (T88.8XXS) Impression: Partial resolution of abdominal wall/intermesh preperitoneal seroma. Now recurrent. I don't think it'll go away without excising it. It is well encapsulated. It seems stable to me. But it is still a source of discomfort.  I cautioned her that she probably will still have abdominal pain issues. However may be more simple if we can get rid of the seroma with the associated panniculus. Less weight or discomfort in the region. There is a risk of wound breakdown or mesh infection. Hopefully not too likely for mesh infection at this point.  I would table surgery until she discussed with her gynecologist given the worsening fibroids and menorrhagia. See if there is anything they want to do with that since hormonal suppression is not an option given her history of strokes.  Adin Hector, M.D., F.A.C.S. Gastrointestinal and Minimally Invasive Surgery [facility] [address], [place], [place] [address] [phone] Main / Paging

## 2015-04-24 IMAGING — CT CT ABD-PELV W/O CM
2 of 4 series · 10 of 46 positions shown, 11 images · non-contrast
Comparison: Most recent CT 03/06/2014

CLINICAL DATA: Abdominal wall abscess. Change in drainage from
postoperative drain and drainage around the drain entry site and
pain. History of right lower quadrant hernia repair.

EXAM:
CT ABDOMEN AND PELVIS WITHOUT CONTRAST
TECHNIQUE: Multidetector CT imaging of the abdomen and pelvis was performed
following the standard protocol without IV contrast.

[Series 201: routine, idose (2) · axial · 0.78mm/px · z∈[+95,+480]mm · 7 of 93 slices shown, 8 images]
[im 8/93  soft-tissue]
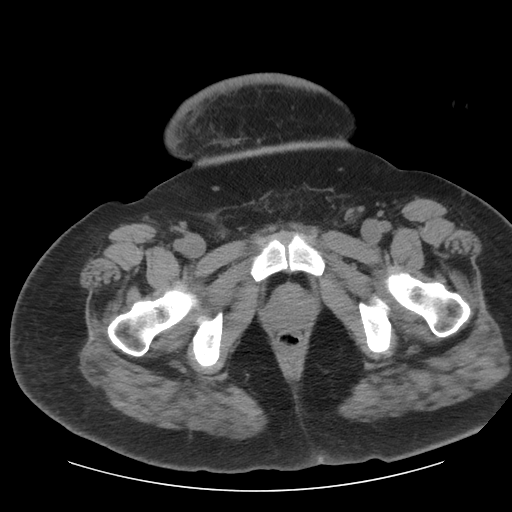
[im 8/93  bone]
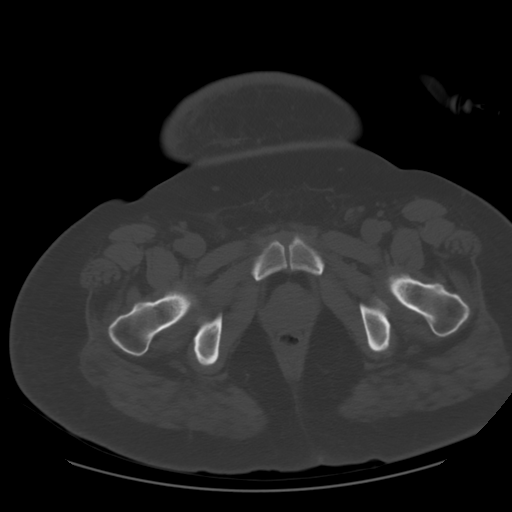
[im 20/93  soft-tissue]
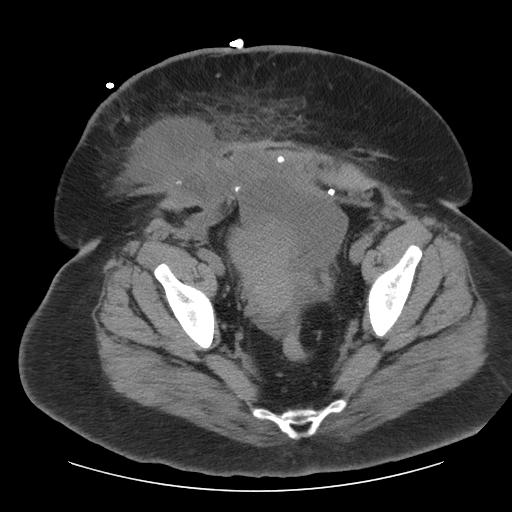
[im 35/93  soft-tissue]
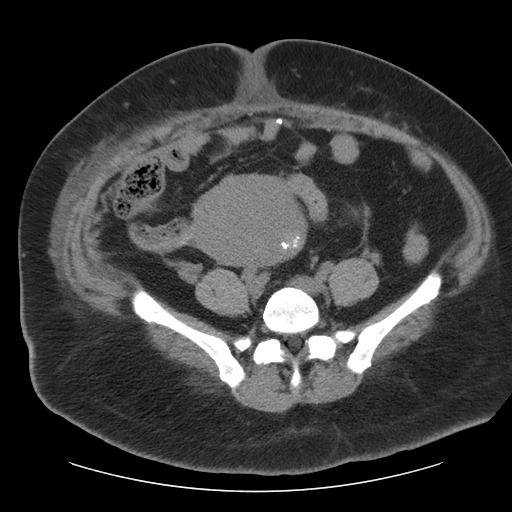
[im 47/93  soft-tissue]
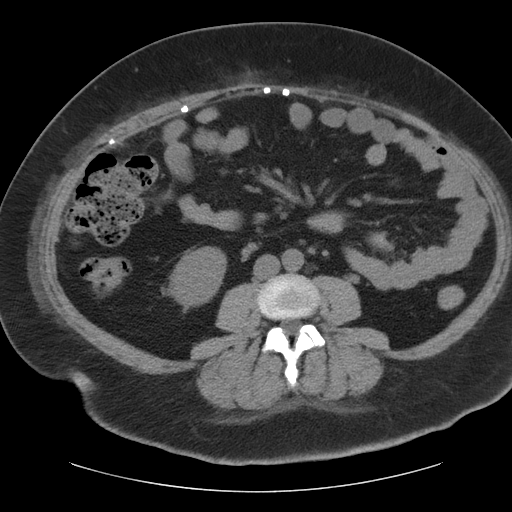
[im 58/93  soft-tissue]
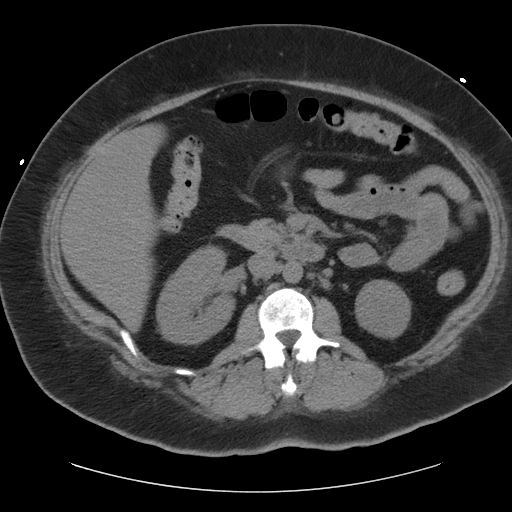
[im 73/93  soft-tissue]
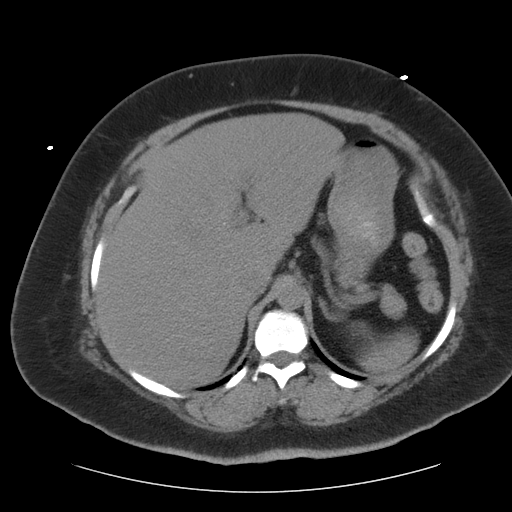
[im 85/93  soft-tissue]
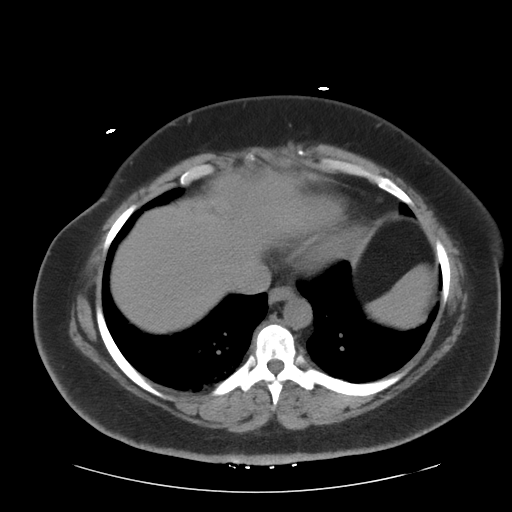

[Series 202: coronals, idose (2) · coronal · 0.45mm/px · 3 of 135 slices shown]
[im 45/135  soft-tissue]
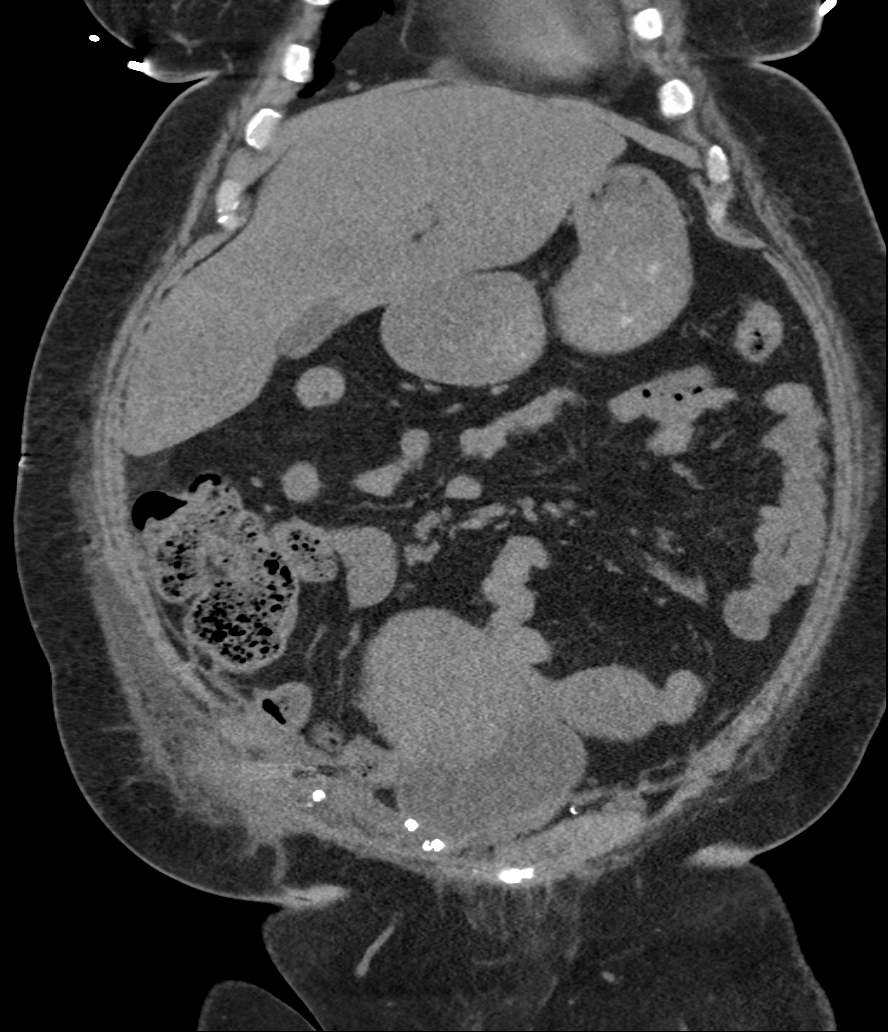
[im 60/135  soft-tissue]
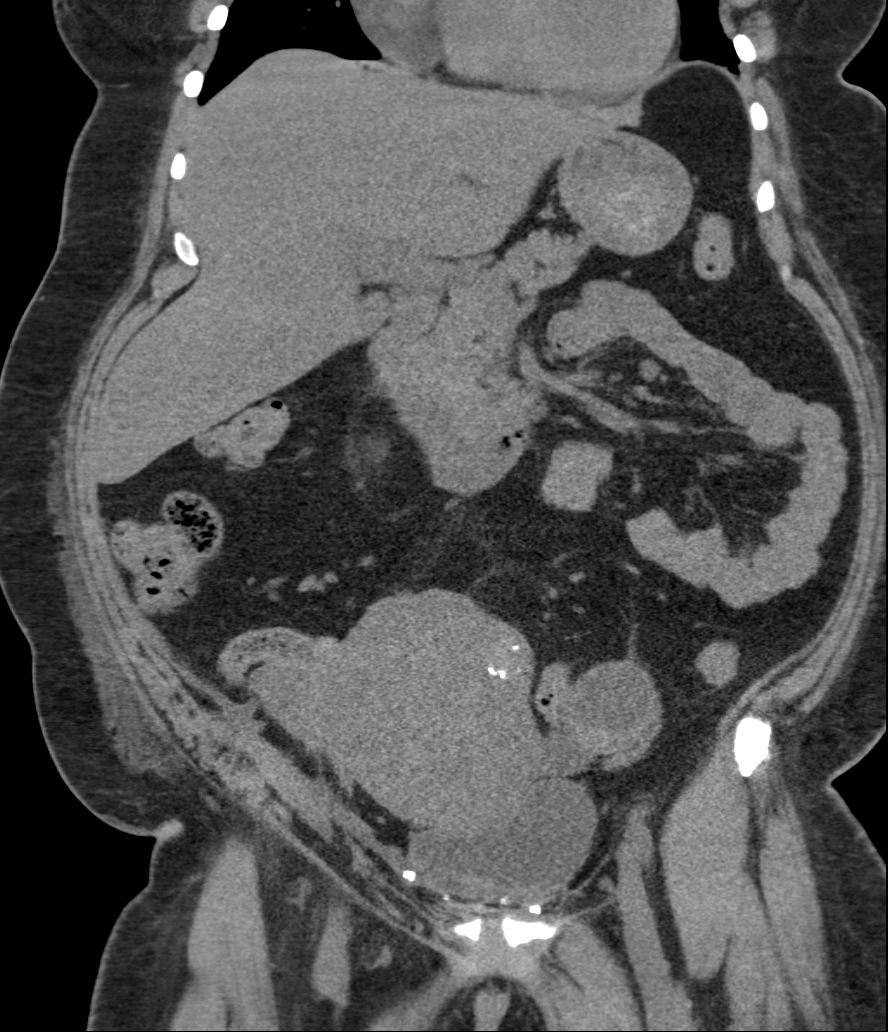
[im 75/135  soft-tissue]
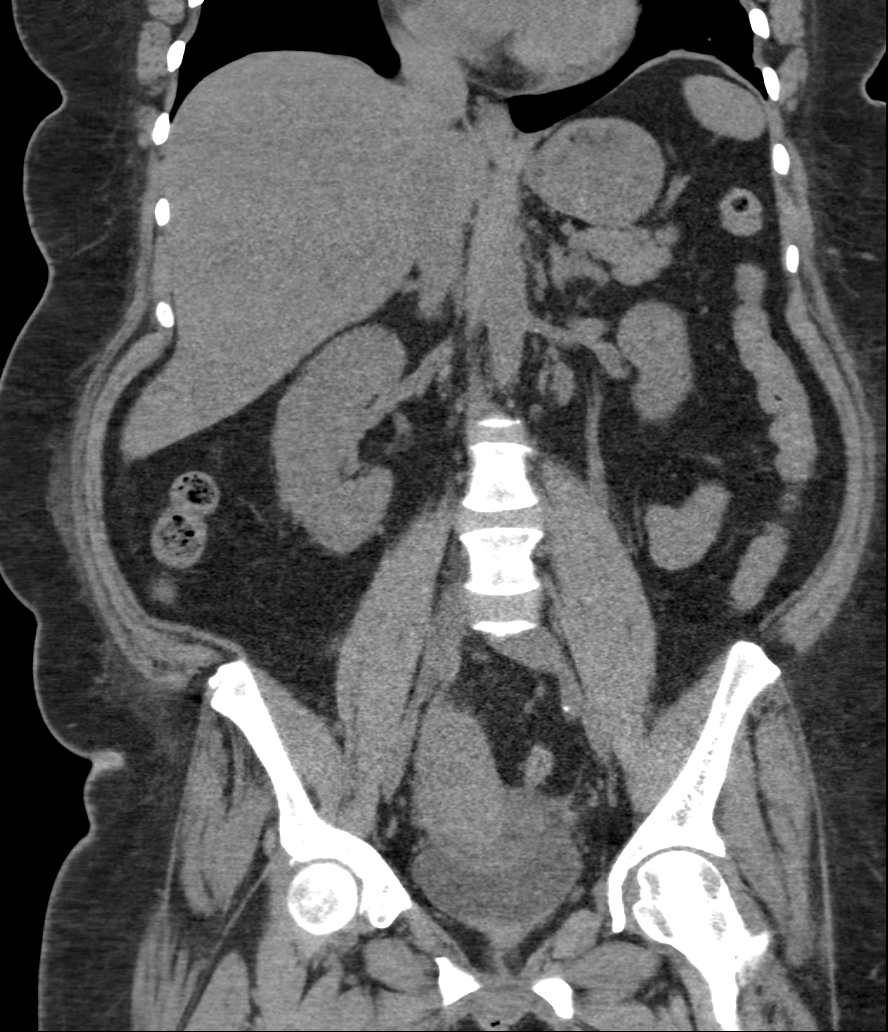

[10 of 46 positions shown; findings below may reference images not displayed]

FINDINGS: Mild dependent atelectasis at the lung bases.

Right lower quadrant anterior abdominal wall fluid collection
currently measures 8.8 x 7.2 cm, previously 10.3 x 8.7 cm. This
appears similar in appearance and extends through the anterior
abdominal wall into the right lower quadrant peritoneal space.
Drainage catheter is in place, no definite fluid along the course of
the drainage catheter. Fluid collection remains heterogeneous with
probable internal loculations. There is limited assessment of the
subjacent bowel in the right lower abdomen given lack of oral
contrast, however appears similar to that of prior exam. No definite
new fluid collection. There is associated soft tissue edema about
the fluid collection in the anterior abdominal wall, the soft tissue
edema appears progressed from prior. Surgical mesh in the anterior
abdominal wall is again seen and unchanged from prior.

Liver appears prominent with hepatic steatosis, more focal fatty
infiltration adjacent to the falciform ligament. The unenhanced
gallbladder, spleen, pancreas, and adrenal glands are unremarkable.
The unenhanced kidneys are unchanged in appearance with probable
scarring in the left kidney. There is no hydronephrosis.

The abdominal aorta is normal in caliber. Small retroperitoneal
lymph nodes that appears similar in size compared to prior exam.

Uterus is enlarged and contains calcified posterior fundal fibroid.
The left ovary remains prominent in size. Appendix is normal. Right
ovary is grossly unchanged. There is small amount of free fluid in
the cul-de-sac. The urinary bladder appears physiologically
distended in abuts the anterior abdominal wall.

No acute osseous abnormalities.
IMPRESSION: 1. Slight decreased size of the right lower quadrant anterior
abdominal fluid collection currently measuring 8.8 x 7.2 cm with
drainage catheter in place. Fluid collection remains heterogeneous
with probable internal loculations. There is increase in the
surrounding soft tissue edema in the anterior abdominal wall which
may reflect surrounding soft tissue inflammation/infection.
2. Small amount of free fluid in the pelvis appears similar to prior
exam. There is no definite new fluid collection.
3. Uterine fibroids and prominence of the left ovary, this appears
similar to prior exams.

## 2015-05-16 DIAGNOSIS — E118 Type 2 diabetes mellitus with unspecified complications: Secondary | ICD-10-CM | POA: Diagnosis not present

## 2015-05-16 DIAGNOSIS — R109 Unspecified abdominal pain: Secondary | ICD-10-CM | POA: Diagnosis not present

## 2015-05-16 DIAGNOSIS — G629 Polyneuropathy, unspecified: Secondary | ICD-10-CM | POA: Diagnosis not present

## 2015-05-18 ENCOUNTER — Encounter (HOSPITAL_COMMUNITY)
Admission: RE | Admit: 2015-05-18 | Discharge: 2015-05-18 | Disposition: A | Discharge status: Home / Self Care | Payer: Federal, State, Local not specified - PPO | Source: Ambulatory Visit | Attending: Surgery | Admitting: Surgery

## 2015-05-18 ENCOUNTER — Encounter (HOSPITAL_COMMUNITY): Payer: Self-pay

## 2015-05-18 DIAGNOSIS — T148 Other injury of unspecified body region: Secondary | ICD-10-CM | POA: Insufficient documentation

## 2015-05-18 DIAGNOSIS — Z01812 Encounter for preprocedural laboratory examination: Secondary | ICD-10-CM | POA: Insufficient documentation

## 2015-05-18 DIAGNOSIS — Z01810 Encounter for preprocedural cardiovascular examination: Secondary | ICD-10-CM | POA: Insufficient documentation

## 2015-05-18 DIAGNOSIS — E65 Localized adiposity: Secondary | ICD-10-CM | POA: Diagnosis not present

## 2015-05-18 DIAGNOSIS — G8929 Other chronic pain: Secondary | ICD-10-CM | POA: Diagnosis not present

## 2015-05-18 DIAGNOSIS — X58XXXA Exposure to other specified factors, initial encounter: Secondary | ICD-10-CM | POA: Diagnosis not present

## 2015-05-18 HISTORY — DX: Endometriosis, unspecified: N80.9

## 2015-05-18 LAB — CBC
HCT: 35.8 % — ABNORMAL LOW (ref 36.0–46.0)
HEMOGLOBIN: 11.8 g/dL — AB (ref 12.0–15.0)
MCH: 26 pg (ref 26.0–34.0)
MCHC: 33 g/dL (ref 30.0–36.0)
MCV: 78.9 fL (ref 78.0–100.0)
PLATELETS: 296 10*3/uL (ref 150–400)
RBC: 4.54 MIL/uL (ref 3.87–5.11)
RDW: 13.4 % (ref 11.5–15.5)
WBC: 6.1 10*3/uL (ref 4.0–10.5)

## 2015-05-18 LAB — BASIC METABOLIC PANEL
ANION GAP: 10 (ref 5–15)
BUN: 16 mg/dL (ref 6–20)
CHLORIDE: 105 mmol/L (ref 101–111)
CO2: 27 mmol/L (ref 22–32)
CREATININE: 0.92 mg/dL (ref 0.44–1.00)
Calcium: 9.4 mg/dL (ref 8.9–10.3)
GFR calc non Af Amer: >60 mL/min (ref >60)
Glucose, Bld: 131 mg/dL — ABNORMAL HIGH (ref 65–99)
Potassium: 4.1 mmol/L (ref 3.5–5.1)
SODIUM: 142 mmol/L (ref 135–145)

## 2015-05-18 LAB — HCG, SERUM, QUALITATIVE: PREG SERUM: NEGATIVE

## 2015-05-18 NOTE — Pre-Procedure Instructions (Addendum)
EKG done today. CT abdomen/pelvis 1'17.

## 2015-05-18 NOTE — Patient Instructions (Signed)
Tina [name]  05/18/2015   Your procedure is scheduled on: 05-25-15   Report to [facility] Main  Entrance take [facility]  elevators to 3rd floor to  [place] at [place]  AM.  Call this number if you have problems the morning of surgery [phone]   Remember: ONLY 1 PERSON MAY GO WITH YOU TO SHORT STAY TO GET  READY MORNING OF [place].  Do not eat food or drink liquids :After Midnight.     Take these medicines the morning of surgery with A SIP OF WATER: Gabapentin. Tramadol-if need.  Lantus(1/2 usual PM dose) night before - no insulin or diabetic meds AM of. DO NOT TAKE ANY DIABETIC MEDICATIONS DAY OF YOUR SURGERY                               You may not have any metal on your body including hair pins and              piercings  Do not wear jewelry, make-up, lotions, powders or perfumes, deodorant             Do not wear nail polish.  Do not shave  48 hours prior to surgery.              Men may shave face and neck.   Do not bring valuables to the hospital. [place].  Contacts, dentures or bridgework may not be worn into surgery.  Leave suitcase in the car. After surgery it may be brought to your room.     Patients discharged the day of surgery will not be allowed to drive home.  Name and phone number of your driver: Annie Main "Richardson Landry" -spouse [phone] cell  Special Instructions: N/A              Please read over the following fact sheets you were given: _____________________________________________________________________             [facility] - Preparing for Surgery Before surgery, you can play an important role.  Because skin is not sterile, your skin needs to be as free of germs as possible.  You can reduce the number of germs on your skin by washing with CHG (chlorahexidine gluconate) soap before surgery.  CHG is an antiseptic cleaner which kills germs and bonds with the skin to  continue killing germs even after washing. Please DO NOT use if you have an allergy to CHG or antibacterial soaps.  If your skin becomes reddened/irritated stop using the CHG and inform your nurse when you arrive at Short Stay. Do not shave (including legs and underarms) for at least 48 hours prior to the first CHG shower.  You may shave your face/neck. Please follow these instructions carefully:  1.  Shower with CHG Soap the night before surgery and the  morning of Surgery.  2.  If you choose to wash your hair, wash your hair first as usual with your  normal  shampoo.  3.  After you shampoo, rinse your hair and body thoroughly to remove the  shampoo.                           4.  Use CHG as you would any other liquid soap.  You can apply chg directly  to the skin and wash                       Gently with a scrungie or clean washcloth.  5.  Apply the CHG Soap to your body ONLY FROM THE NECK DOWN.   Do not use on face/ open                           Wound or open sores. Avoid contact with eyes, ears mouth and genitals (private parts).                       Wash face,  Genitals (private parts) with your normal soap.             6.  Wash thoroughly, paying special attention to the area where your surgery  will be performed.  7.  Thoroughly rinse your body with warm water from the neck down.  8.  DO NOT shower/wash with your normal soap after using and rinsing off  the CHG Soap.                9.  Pat yourself dry with a clean towel.            10.  Wear clean pajamas.            11.  Place clean sheets on your bed the night of your first shower and do not  sleep with pets. Day of Surgery : Do not apply any lotions/deodorants the morning of surgery.  Please wear clean clothes to the hospital/surgery center.  FAILURE TO FOLLOW THESE INSTRUCTIONS MAY RESULT IN THE CANCELLATION OF YOUR SURGERY PATIENT SIGNATURE_________________________________  NURSE  SIGNATURE__________________________________  ________________________________________________________________________

## 2015-05-19 LAB — HEMOGLOBIN A1C
HEMOGLOBIN A1C: 7.5 % — AB (ref 4.8–5.6)
MEAN PLASMA GLUCOSE: 169 mg/dL

## 2015-05-21 DIAGNOSIS — H4311 Vitreous hemorrhage, right eye: Secondary | ICD-10-CM | POA: Diagnosis not present

## 2015-05-21 DIAGNOSIS — E113512 Type 2 diabetes mellitus with proliferative diabetic retinopathy with macular edema, left eye: Secondary | ICD-10-CM | POA: Diagnosis not present

## 2015-05-21 DIAGNOSIS — E113552 Type 2 diabetes mellitus with stable proliferative diabetic retinopathy, left eye: Secondary | ICD-10-CM | POA: Diagnosis not present

## 2015-05-21 DIAGNOSIS — E113551 Type 2 diabetes mellitus with stable proliferative diabetic retinopathy, right eye: Secondary | ICD-10-CM | POA: Diagnosis not present

## 2015-05-24 MED ORDER — GENTAMICIN SULFATE 40 MG/ML IJ SOLN
5.0000 mg/kg | INTRAVENOUS | Status: AC
  Administered 2015-05-25: 400 mg via INTRAVENOUS
  Filled 2015-05-24: qty 10

## 2015-05-24 MED ORDER — SODIUM CHLORIDE 0.9 % IV SOLN
INTRAVENOUS | Status: DC
  Filled 2015-05-24: qty 6

## 2015-05-25 ENCOUNTER — Encounter (HOSPITAL_COMMUNITY)
Admission: RE | Disposition: A | Discharge status: Home / Self Care | Payer: Self-pay | Source: Ambulatory Visit | Attending: Surgery

## 2015-05-25 ENCOUNTER — Ambulatory Visit (HOSPITAL_COMMUNITY): Payer: Federal, State, Local not specified - PPO | Admitting: Registered Nurse

## 2015-05-25 ENCOUNTER — Encounter (HOSPITAL_COMMUNITY): Payer: Self-pay | Admitting: *Deleted

## 2015-05-25 ENCOUNTER — Observation Stay (HOSPITAL_COMMUNITY)
Admission: RE | Admit: 2015-05-25 | Discharge: 2015-05-26 | Disposition: A | Discharge status: Home / Self Care | Payer: Federal, State, Local not specified - PPO | Source: Ambulatory Visit | Attending: Surgery | Admitting: Surgery

## 2015-05-25 DIAGNOSIS — Z794 Long term (current) use of insulin: Secondary | ICD-10-CM | POA: Diagnosis not present

## 2015-05-25 DIAGNOSIS — IMO0002 Reserved for concepts with insufficient information to code with codable children: Secondary | ICD-10-CM | POA: Diagnosis present

## 2015-05-25 DIAGNOSIS — Z7952 Long term (current) use of systemic steroids: Secondary | ICD-10-CM | POA: Insufficient documentation

## 2015-05-25 DIAGNOSIS — L7634 Postprocedural seroma of skin and subcutaneous tissue following other procedure: Secondary | ICD-10-CM | POA: Diagnosis not present

## 2015-05-25 DIAGNOSIS — Z6838 Body mass index (BMI) 38.0-38.9, adult: Secondary | ICD-10-CM | POA: Insufficient documentation

## 2015-05-25 DIAGNOSIS — E119 Type 2 diabetes mellitus without complications: Secondary | ICD-10-CM | POA: Diagnosis not present

## 2015-05-25 DIAGNOSIS — E1165 Type 2 diabetes mellitus with hyperglycemia: Secondary | ICD-10-CM | POA: Diagnosis not present

## 2015-05-25 DIAGNOSIS — S301XXA Contusion of abdominal wall, initial encounter: Secondary | ICD-10-CM | POA: Diagnosis not present

## 2015-05-25 DIAGNOSIS — Y838 Other surgical procedures as the cause of abnormal reaction of the patient, or of later complication, without mention of misadventure at the time of the procedure: Secondary | ICD-10-CM | POA: Insufficient documentation

## 2015-05-25 DIAGNOSIS — I10 Essential (primary) hypertension: Secondary | ICD-10-CM | POA: Insufficient documentation

## 2015-05-25 DIAGNOSIS — F419 Anxiety disorder, unspecified: Secondary | ICD-10-CM | POA: Diagnosis not present

## 2015-05-25 DIAGNOSIS — E1140 Type 2 diabetes mellitus with diabetic neuropathy, unspecified: Secondary | ICD-10-CM | POA: Diagnosis not present

## 2015-05-25 DIAGNOSIS — Z79891 Long term (current) use of opiate analgesic: Secondary | ICD-10-CM | POA: Insufficient documentation

## 2015-05-25 DIAGNOSIS — L7622 Postprocedural hemorrhage and hematoma of skin and subcutaneous tissue following other procedure: Secondary | ICD-10-CM | POA: Diagnosis not present

## 2015-05-25 DIAGNOSIS — K432 Incisional hernia without obstruction or gangrene: Secondary | ICD-10-CM | POA: Diagnosis not present

## 2015-05-25 DIAGNOSIS — E65 Localized adiposity: Secondary | ICD-10-CM | POA: Insufficient documentation

## 2015-05-25 DIAGNOSIS — M109 Gout, unspecified: Secondary | ICD-10-CM | POA: Diagnosis not present

## 2015-05-25 DIAGNOSIS — M795 Residual foreign body in soft tissue: Secondary | ICD-10-CM | POA: Diagnosis not present

## 2015-05-25 DIAGNOSIS — M799 Soft tissue disorder, unspecified: Secondary | ICD-10-CM | POA: Diagnosis not present

## 2015-05-25 DIAGNOSIS — Z8673 Personal history of transient ischemic attack (TIA), and cerebral infarction without residual deficits: Secondary | ICD-10-CM | POA: Insufficient documentation

## 2015-05-25 DIAGNOSIS — Z79899 Other long term (current) drug therapy: Secondary | ICD-10-CM | POA: Diagnosis not present

## 2015-05-25 DIAGNOSIS — K469 Unspecified abdominal hernia without obstruction or gangrene: Secondary | ICD-10-CM | POA: Diagnosis not present

## 2015-05-25 DIAGNOSIS — F329 Major depressive disorder, single episode, unspecified: Secondary | ICD-10-CM | POA: Diagnosis not present

## 2015-05-25 DIAGNOSIS — R1903 Right lower quadrant abdominal swelling, mass and lump: Secondary | ICD-10-CM | POA: Diagnosis present

## 2015-05-25 DIAGNOSIS — R109 Unspecified abdominal pain: Secondary | ICD-10-CM | POA: Diagnosis present

## 2015-05-25 DIAGNOSIS — Z4802 Encounter for removal of sutures: Secondary | ICD-10-CM | POA: Diagnosis not present

## 2015-05-25 HISTORY — PX: EXCISION MASS ABDOMINAL: SHX6701

## 2015-05-25 HISTORY — PX: APPLICATION OF WOUND VAC: SHX5189

## 2015-05-25 LAB — GLUCOSE, CAPILLARY
GLUCOSE-CAPILLARY: 149 mg/dL — AB (ref 65–99)
GLUCOSE-CAPILLARY: 318 mg/dL — AB (ref 65–99)
Glucose-Capillary: 335 mg/dL — ABNORMAL HIGH (ref 65–99)
Glucose-Capillary: 315 mg/dL — ABNORMAL HIGH (ref 65–99)
Glucose-Capillary: 256 mg/dL — ABNORMAL HIGH (ref 65–99)
Glucose-Capillary: 380 mg/dL — ABNORMAL HIGH (ref 65–99)
Glucose-Capillary: 428 mg/dL — ABNORMAL HIGH (ref 65–99)
Glucose-Capillary: 387 mg/dL — ABNORMAL HIGH (ref 65–99)

## 2015-05-25 SURGERY — EXCISION, MASS, TORSO
Anesthesia: General | Site: Abdomen

## 2015-05-25 MED ORDER — ONDANSETRON HCL 4 MG PO TABS: 4.0000 mg | ORAL_TABLET | Freq: Four times a day (QID) | ORAL | Status: DC | PRN

## 2015-05-25 MED ORDER — HYDROMORPHONE HCL 2 MG/ML IJ SOLN
INTRAMUSCULAR | Status: AC
  Filled 2015-05-25: qty 1

## 2015-05-25 MED ORDER — ONDANSETRON HCL 4 MG/2ML IJ SOLN
INTRAMUSCULAR | Status: DC | PRN
  Administered 2015-05-25: 4 mg via INTRAVENOUS

## 2015-05-25 MED ORDER — HYDROMORPHONE HCL 2 MG/ML IJ SOLN
INTRAMUSCULAR | Status: AC
Start: 2015-05-25 — End: 2015-05-25
  Filled 2015-05-25: qty 1

## 2015-05-25 MED ORDER — FENTANYL CITRATE (PF) 250 MCG/5ML IJ SOLN
INTRAMUSCULAR | Status: AC
  Filled 2015-05-25: qty 10

## 2015-05-25 MED ORDER — IRBESARTAN 150 MG PO TABS: 300.0000 mg | ORAL_TABLET | Freq: Every day | ORAL | Status: DC

## 2015-05-25 MED ORDER — INSULIN ASPART 100 UNIT/ML ~~LOC~~ SOLN
0.0000 [IU] | Freq: Three times a day (TID) | SUBCUTANEOUS | Status: DC
  Administered 2015-05-25: 20 [IU] via SUBCUTANEOUS
  Administered 2015-05-26: 4 [IU] via SUBCUTANEOUS
  Filled 2015-05-25 (×2): qty 1

## 2015-05-25 MED ORDER — ROCURONIUM BROMIDE 50 MG/5ML IV SOLN
INTRAVENOUS | Status: AC
  Filled 2015-05-25: qty 1

## 2015-05-25 MED ORDER — CEFAZOLIN SODIUM-DEXTROSE 2-4 GM/100ML-% IV SOLN
2.0000 g | Freq: Three times a day (TID) | INTRAVENOUS | Status: AC
  Administered 2015-05-25 (×2): 2 g via INTRAVENOUS
  Filled 2015-05-25 (×2): qty 100

## 2015-05-25 MED ORDER — METOPROLOL TARTRATE 12.5 MG HALF TABLET: 12.5000 mg | ORAL_TABLET | Freq: Two times a day (BID) | ORAL | Status: DC | PRN

## 2015-05-25 MED ORDER — CHLORHEXIDINE GLUCONATE 4 % EX LIQD: 1.0000 "application " | Freq: Once | CUTANEOUS | Status: DC

## 2015-05-25 MED ORDER — LABETALOL HCL 5 MG/ML IV SOLN
INTRAVENOUS | Status: AC
  Filled 2015-05-25: qty 4

## 2015-05-25 MED ORDER — DIPHENHYDRAMINE HCL 12.5 MG/5ML PO ELIX: 12.5000 mg | ORAL_SOLUTION | Freq: Four times a day (QID) | ORAL | Status: DC | PRN

## 2015-05-25 MED ORDER — ONDANSETRON HCL 4 MG/2ML IJ SOLN: 4.0000 mg | Freq: Four times a day (QID) | INTRAMUSCULAR | Status: DC | PRN

## 2015-05-25 MED ORDER — ENOXAPARIN SODIUM 40 MG/0.4ML ~~LOC~~ SOLN
40.0000 mg | SUBCUTANEOUS | Status: DC
  Administered 2015-05-26: 40 mg via SUBCUTANEOUS
  Filled 2015-05-25: qty 0.4

## 2015-05-25 MED ORDER — METOCLOPRAMIDE HCL 5 MG/ML IJ SOLN: 5.0000 mg | Freq: Four times a day (QID) | INTRAMUSCULAR | Status: DC | PRN

## 2015-05-25 MED ORDER — MAGIC MOUTHWASH
15.0000 mL | Freq: Four times a day (QID) | ORAL | Status: DC | PRN
  Filled 2015-05-25: qty 15

## 2015-05-25 MED ORDER — AMLODIPINE BESYLATE 10 MG PO TABS: 10.0000 mg | ORAL_TABLET | Freq: Every day | ORAL | Status: DC

## 2015-05-25 MED ORDER — INSULIN ASPART 100 UNIT/ML ~~LOC~~ SOLN: 0.0000 [IU] | Freq: Every day | SUBCUTANEOUS | Status: DC

## 2015-05-25 MED ORDER — CEFAZOLIN SODIUM-DEXTROSE 2-4 GM/100ML-% IV SOLN
INTRAVENOUS | Status: AC
  Filled 2015-05-25: qty 100

## 2015-05-25 MED ORDER — BUPIVACAINE LIPOSOME 1.3 % IJ SUSP
INTRAMUSCULAR | Status: DC | PRN
  Administered 2015-05-25: 20 mL

## 2015-05-25 MED ORDER — LIDOCAINE HCL (CARDIAC) 20 MG/ML IV SOLN
INTRAVENOUS | Status: DC | PRN
  Administered 2015-05-25: 100 mg via INTRAVENOUS
  Administered 2015-05-25: 40 mg via INTRATRACHEAL

## 2015-05-25 MED ORDER — SUGAMMADEX SODIUM 200 MG/2ML IV SOLN
INTRAVENOUS | Status: DC | PRN
  Administered 2015-05-25: 150 mg via INTRAVENOUS

## 2015-05-25 MED ORDER — LACTATED RINGERS IV SOLN
INTRAVENOUS | Status: DC | PRN
  Administered 2015-05-25 (×4): via INTRAVENOUS

## 2015-05-25 MED ORDER — SODIUM CHLORIDE 0.9 % IV SOLN
25.0000 mg | Freq: Four times a day (QID) | INTRAVENOUS | Status: DC | PRN
  Filled 2015-05-25: qty 1

## 2015-05-25 MED ORDER — ACETAMINOPHEN 10 MG/ML IV SOLN
INTRAVENOUS | Status: AC
  Filled 2015-05-25: qty 100

## 2015-05-25 MED ORDER — DEXAMETHASONE SODIUM PHOSPHATE 10 MG/ML IJ SOLN
INTRAMUSCULAR | Status: DC | PRN
  Administered 2015-05-25: 10 mg via INTRAVENOUS

## 2015-05-25 MED ORDER — 0.9 % SODIUM CHLORIDE (POUR BTL) OPTIME
TOPICAL | Status: DC | PRN
  Administered 2015-05-25: 2000 mL

## 2015-05-25 MED ORDER — PROPOFOL 10 MG/ML IV BOLUS
INTRAVENOUS | Status: DC | PRN
  Administered 2015-05-25: 200 mg via INTRAVENOUS
  Administered 2015-05-25: 50 mg via INTRAVENOUS

## 2015-05-25 MED ORDER — INSULIN ASPART 100 UNIT/ML ~~LOC~~ SOLN
6.0000 [IU] | Freq: Three times a day (TID) | SUBCUTANEOUS | Status: DC
  Administered 2015-05-25 – 2015-05-26 (×2): 6 [IU] via SUBCUTANEOUS
  Filled 2015-05-25 (×2): qty 1

## 2015-05-25 MED ORDER — SODIUM CHLORIDE 0.9% FLUSH
3.0000 mL | Freq: Two times a day (BID) | INTRAVENOUS | Status: DC
  Administered 2015-05-25 – 2015-05-26 (×2): 3 mL via INTRAVENOUS

## 2015-05-25 MED ORDER — LIRAGLUTIDE 18 MG/3ML ~~LOC~~ SOPN: 1.2000 mg | PEN_INJECTOR | Freq: Every day | SUBCUTANEOUS | Status: DC

## 2015-05-25 MED ORDER — LABETALOL HCL 5 MG/ML IV SOLN
INTRAVENOUS | Status: DC | PRN
  Administered 2015-05-25: 5 mg via INTRAVENOUS
  Administered 2015-05-25 (×2): 2.5 mg via INTRAVENOUS

## 2015-05-25 MED ORDER — LIP MEDEX EX OINT
1.0000 "application " | TOPICAL_OINTMENT | Freq: Two times a day (BID) | CUTANEOUS | Status: DC
  Administered 2015-05-25 – 2015-05-26 (×3): 1 via TOPICAL
  Filled 2015-05-25 (×2): qty 7

## 2015-05-25 MED ORDER — PROPOFOL 10 MG/ML IV BOLUS
INTRAVENOUS | Status: AC
  Filled 2015-05-25: qty 20

## 2015-05-25 MED ORDER — GLYCOPYRROLATE 0.2 MG/ML IJ SOLN
INTRAMUSCULAR | Status: AC
  Filled 2015-05-25: qty 1

## 2015-05-25 MED ORDER — FENTANYL CITRATE (PF) 100 MCG/2ML IJ SOLN
INTRAMUSCULAR | Status: DC | PRN
  Administered 2015-05-25: 50 ug via INTRAVENOUS
  Administered 2015-05-25 (×2): 100 ug via INTRAVENOUS

## 2015-05-25 MED ORDER — BUPIVACAINE-EPINEPHRINE 0.25% -1:200000 IJ SOLN
INTRAMUSCULAR | Status: DC | PRN
Start: 2015-05-25 — End: 2015-05-25
  Administered 2015-05-25: 80 mL

## 2015-05-25 MED ORDER — GABAPENTIN 300 MG PO CAPS
300.0000 mg | ORAL_CAPSULE | Freq: Three times a day (TID) | ORAL | Status: DC
  Administered 2015-05-25 – 2015-05-26 (×4): 300 mg via ORAL
  Filled 2015-05-25 (×4): qty 1

## 2015-05-25 MED ORDER — GENTAMICIN SULFATE 40 MG/ML IJ SOLN
INTRAMUSCULAR | Status: DC | PRN
  Administered 2015-05-25: 1000 mL via INTRAPERITONEAL

## 2015-05-25 MED ORDER — SUGAMMADEX SODIUM 200 MG/2ML IV SOLN
INTRAVENOUS | Status: AC
Start: 2015-05-25 — End: 2015-05-25
  Filled 2015-05-25: qty 2

## 2015-05-25 MED ORDER — INSULIN GLARGINE 100 UNIT/ML ~~LOC~~ SOLN
15.0000 [IU] | Freq: Every morning | SUBCUTANEOUS | Status: DC
  Administered 2015-05-26: 15 [IU] via SUBCUTANEOUS
  Filled 2015-05-25: qty 0.15

## 2015-05-25 MED ORDER — COLCHICINE 0.6 MG PO TABS
0.6000 mg | ORAL_TABLET | Freq: Every day | ORAL | Status: DC | PRN
  Filled 2015-05-25: qty 1

## 2015-05-25 MED ORDER — MENTHOL 3 MG MT LOZG: 1.0000 | LOZENGE | OROMUCOSAL | Status: DC | PRN

## 2015-05-25 MED ORDER — INSULIN GLARGINE 100 UNIT/ML ~~LOC~~ SOLN
10.0000 [IU] | Freq: Every day | SUBCUTANEOUS | Status: DC
  Filled 2015-05-25 (×2): qty 0.1

## 2015-05-25 MED ORDER — ONDANSETRON 4 MG PO TBDP: 4.0000 mg | ORAL_TABLET | Freq: Four times a day (QID) | ORAL | Status: DC | PRN

## 2015-05-25 MED ORDER — SODIUM CHLORIDE 0.9 % IV SOLN
INTRAVENOUS | Status: DC
  Administered 2015-05-25: 2.8 [IU]/h via INTRAVENOUS
  Filled 2015-05-25: qty 2.5

## 2015-05-25 MED ORDER — AMLODIPINE BESYLATE 10 MG PO TABS
10.0000 mg | ORAL_TABLET | Freq: Once | ORAL | Status: AC
  Administered 2015-05-25: 10 mg via ORAL
  Filled 2015-05-25: qty 1

## 2015-05-25 MED ORDER — CEFAZOLIN SODIUM-DEXTROSE 2-4 GM/100ML-% IV SOLN
2.0000 g | INTRAVENOUS | Status: AC
  Administered 2015-05-25: 2 g via INTRAVENOUS
  Filled 2015-05-25: qty 100

## 2015-05-25 MED ORDER — ACETAMINOPHEN 325 MG PO TABS
650.0000 mg | ORAL_TABLET | Freq: Four times a day (QID) | ORAL | Status: DC | PRN
  Administered 2015-05-26: 650 mg via ORAL
  Filled 2015-05-25: qty 2

## 2015-05-25 MED ORDER — AMLODIPINE-VALSARTAN-HCTZ 10-320-25 MG PO TABS: 1.0000 | ORAL_TABLET | ORAL | Status: DC

## 2015-05-25 MED ORDER — POLYETHYLENE GLYCOL 3350 17 G PO PACK
17.0000 g | PACK | Freq: Two times a day (BID) | ORAL | Status: DC
  Administered 2015-05-25 – 2015-05-26 (×2): 17 g via ORAL
  Filled 2015-05-25 (×2): qty 1

## 2015-05-25 MED ORDER — INSULIN ASPART 100 UNIT/ML ~~LOC~~ SOLN: 0.0000 [IU] | SUBCUTANEOUS | Status: DC

## 2015-05-25 MED ORDER — INSULIN GLARGINE 100 UNIT/ML ~~LOC~~ SOLN
10.0000 [IU] | Freq: Every day | SUBCUTANEOUS | Status: DC
  Filled 2015-05-25: qty 0.1

## 2015-05-25 MED ORDER — HYDROMORPHONE HCL 1 MG/ML IJ SOLN
INTRAMUSCULAR | Status: DC | PRN
  Administered 2015-05-25: .5 mg via INTRAVENOUS
  Administered 2015-05-25: 0.5 mg via INTRAVENOUS
  Administered 2015-05-25 (×2): 1 mg via INTRAVENOUS
  Administered 2015-05-25 (×2): 0.5 mg via INTRAVENOUS

## 2015-05-25 MED ORDER — BISACODYL 10 MG RE SUPP: 10.0000 mg | Freq: Two times a day (BID) | RECTAL | Status: DC | PRN

## 2015-05-25 MED ORDER — SUFENTANIL CITRATE 50 MCG/ML IV SOLN
INTRAVENOUS | Status: DC | PRN
  Administered 2015-05-25 (×3): 10 ug via INTRAVENOUS
  Administered 2015-05-25: 5 ug via INTRAVENOUS
  Administered 2015-05-25: 10 ug via INTRAVENOUS
  Administered 2015-05-25 (×2): 5 ug via INTRAVENOUS

## 2015-05-25 MED ORDER — INSULIN ASPART 100 UNIT/ML FLEXPEN: 5.0000 [IU] | PEN_INJECTOR | Freq: Three times a day (TID) | SUBCUTANEOUS | Status: DC

## 2015-05-25 MED ORDER — BUPIVACAINE-EPINEPHRINE (PF) 0.25% -1:200000 IJ SOLN
INTRAMUSCULAR | Status: AC
Start: 2015-05-25 — End: 2015-05-25
  Filled 2015-05-25: qty 30

## 2015-05-25 MED ORDER — HYDRALAZINE HCL 20 MG/ML IJ SOLN: 10.0000 mg | INTRAMUSCULAR | Status: DC | PRN

## 2015-05-25 MED ORDER — TRAMADOL HCL 50 MG PO TABS: 50.0000 mg | ORAL_TABLET | Freq: Four times a day (QID) | ORAL | Status: DC | PRN

## 2015-05-25 MED ORDER — PHENOL 1.4 % MT LIQD
2.0000 | OROMUCOSAL | Status: DC | PRN
  Filled 2015-05-25: qty 177

## 2015-05-25 MED ORDER — LIDOCAINE HCL (CARDIAC) 20 MG/ML IV SOLN
INTRAVENOUS | Status: AC
  Filled 2015-05-25: qty 5

## 2015-05-25 MED ORDER — HYDROMORPHONE HCL 1 MG/ML IJ SOLN: 0.2500 mg | INTRAMUSCULAR | Status: DC | PRN

## 2015-05-25 MED ORDER — ROCURONIUM BROMIDE 100 MG/10ML IV SOLN
INTRAVENOUS | Status: DC | PRN
  Administered 2015-05-25: 20 mg via INTRAVENOUS
  Administered 2015-05-25: 50 mg via INTRAVENOUS

## 2015-05-25 MED ORDER — INSULIN ASPART 100 UNIT/ML ~~LOC~~ SOLN
8.0000 [IU] | Freq: Once | SUBCUTANEOUS | Status: AC
  Administered 2015-05-25: 8 [IU] via SUBCUTANEOUS

## 2015-05-25 MED ORDER — SUFENTANIL CITRATE 50 MCG/ML IV SOLN
INTRAVENOUS | Status: AC
  Filled 2015-05-25: qty 1

## 2015-05-25 MED ORDER — SODIUM CHLORIDE 0.9 % IV SOLN: 250.0000 mL | INTRAVENOUS | Status: DC | PRN

## 2015-05-25 MED ORDER — TIZANIDINE HCL 4 MG PO TABS
4.0000 mg | ORAL_TABLET | Freq: Every day | ORAL | Status: DC
  Administered 2015-05-25: 4 mg via ORAL
  Filled 2015-05-25 (×3): qty 1

## 2015-05-25 MED ORDER — LACTATED RINGERS IV SOLN
INTRAVENOUS | Status: DC
  Administered 2015-05-25: 16:00:00 via INTRAVENOUS

## 2015-05-25 MED ORDER — BUPIVACAINE-EPINEPHRINE 0.25% -1:200000 IJ SOLN
INTRAMUSCULAR | Status: AC
  Filled 2015-05-25: qty 1

## 2015-05-25 MED ORDER — ONDANSETRON HCL 4 MG/2ML IJ SOLN
INTRAMUSCULAR | Status: AC
  Filled 2015-05-25: qty 2

## 2015-05-25 MED ORDER — INSULIN ASPART 100 UNIT/ML ~~LOC~~ SOLN
SUBCUTANEOUS | Status: AC
  Filled 2015-05-25: qty 1

## 2015-05-25 MED ORDER — SODIUM CHLORIDE 0.9 % IJ SOLN
INTRAMUSCULAR | Status: AC
  Filled 2015-05-25: qty 20

## 2015-05-25 MED ORDER — TRAMADOL HCL 50 MG PO TABS
50.0000 mg | ORAL_TABLET | Freq: Four times a day (QID) | ORAL | Status: DC | PRN
  Administered 2015-05-25: 50 mg via ORAL
  Administered 2015-05-26 (×2): 100 mg via ORAL
  Filled 2015-05-25: qty 1
  Filled 2015-05-25 (×2): qty 2

## 2015-05-25 MED ORDER — GLYCOPYRROLATE 0.2 MG/ML IJ SOLN
INTRAMUSCULAR | Status: DC | PRN
  Administered 2015-05-25: 0.2 mg via INTRAVENOUS

## 2015-05-25 MED ORDER — INSULIN GLARGINE 100 UNIT/ML ~~LOC~~ SOLN
15.0000 [IU] | Freq: Once | SUBCUTANEOUS | Status: AC
  Administered 2015-05-25: 15 [IU] via SUBCUTANEOUS
  Filled 2015-05-25: qty 0.15

## 2015-05-25 MED ORDER — BUPIVACAINE LIPOSOME 1.3 % IJ SUSP
20.0000 mL | INTRAMUSCULAR | Status: DC
  Filled 2015-05-25: qty 20

## 2015-05-25 MED ORDER — LACTATED RINGERS IV BOLUS (SEPSIS)
1000.0000 mL | Freq: Three times a day (TID) | INTRAVENOUS | Status: DC | PRN
Start: 2015-05-25 — End: 2015-05-26

## 2015-05-25 MED ORDER — ASPIRIN 325 MG PO TABS
325.0000 mg | ORAL_TABLET | Freq: Every day | ORAL | Status: DC
  Administered 2015-05-25: 325 mg via ORAL
  Filled 2015-05-25: qty 1

## 2015-05-25 MED ORDER — INSULIN GLARGINE 100 UNIT/ML ~~LOC~~ SOLN: 15.0000 [IU] | Freq: Every morning | SUBCUTANEOUS | Status: DC

## 2015-05-25 MED ORDER — LIDOCAINE HCL (CARDIAC) 20 MG/ML IV SOLN
INTRAVENOUS | Status: AC
  Filled 2015-05-25: qty 10

## 2015-05-25 MED ORDER — DEXAMETHASONE SODIUM PHOSPHATE 10 MG/ML IJ SOLN
INTRAMUSCULAR | Status: AC
  Filled 2015-05-25: qty 1

## 2015-05-25 MED ORDER — ACETAMINOPHEN 650 MG RE SUPP: 650.0000 mg | Freq: Four times a day (QID) | RECTAL | Status: DC | PRN

## 2015-05-25 MED ORDER — SODIUM CHLORIDE 0.9% FLUSH: 3.0000 mL | INTRAVENOUS | Status: DC | PRN

## 2015-05-25 MED ORDER — ONDANSETRON HCL 4 MG/2ML IJ SOLN: 4.0000 mg | Freq: Once | INTRAMUSCULAR | Status: DC | PRN

## 2015-05-25 MED ORDER — VITAMIN C 500 MG PO TABS
500.0000 mg | ORAL_TABLET | Freq: Two times a day (BID) | ORAL | Status: DC
  Administered 2015-05-25 – 2015-05-26 (×2): 500 mg via ORAL
  Filled 2015-05-25 (×3): qty 1

## 2015-05-25 MED ORDER — DIPHENHYDRAMINE HCL 50 MG/ML IJ SOLN: 12.5000 mg | Freq: Four times a day (QID) | INTRAMUSCULAR | Status: DC | PRN

## 2015-05-25 MED ORDER — HYDROCHLOROTHIAZIDE 25 MG PO TABS: 25.0000 mg | ORAL_TABLET | Freq: Every day | ORAL | Status: DC

## 2015-05-25 MED ORDER — ACETAMINOPHEN 10 MG/ML IV SOLN
INTRAVENOUS | Status: DC | PRN
  Administered 2015-05-25: 1000 mg via INTRAVENOUS

## 2015-05-25 MED ORDER — HYDROMORPHONE HCL 1 MG/ML IJ SOLN
0.5000 mg | INTRAMUSCULAR | Status: DC | PRN
  Administered 2015-05-25 – 2015-05-26 (×3): 1 mg via INTRAVENOUS
  Filled 2015-05-25 (×3): qty 1

## 2015-05-25 MED ORDER — ALLOPURINOL 100 MG PO TABS
100.0000 mg | ORAL_TABLET | Freq: Every evening | ORAL | Status: DC
  Administered 2015-05-25: 100 mg via ORAL
  Filled 2015-05-25 (×2): qty 1

## 2015-05-25 SURGICAL SUPPLY — 42 items
APL SKNCLS STERI-STRIP NONHPOA (GAUZE/BANDAGES/DRESSINGS)
BAG DECANTER FOR FLEXI CONT (MISCELLANEOUS) ×1 IMPLANT
BENZOIN TINCTURE PRP APPL 2/3 (GAUZE/BANDAGES/DRESSINGS) IMPLANT
BINDER ABDOMINAL 12 ML 46-62 (SOFTGOODS) ×2 IMPLANT
BLADE HEX COATED 2.75 (ELECTRODE) ×2 IMPLANT
COUNTER NEEDLE 20 DBL MAG RED (NEEDLE) ×2 IMPLANT
COVER SURGICAL LIGHT HANDLE (MISCELLANEOUS) ×2 IMPLANT
DECANTER SPIKE VIAL GLASS SM (MISCELLANEOUS) ×2 IMPLANT
DRAIN CHANNEL 19F RND (DRAIN) ×2 IMPLANT
DRAPE LAPAROTOMY T 102X78X121 (DRAPES) IMPLANT
DRAPE LAPAROTOMY TRNSV 102X78 (DRAPE) ×1 IMPLANT
DRSG TEGADERM 4X4.75 (GAUZE/BANDAGES/DRESSINGS) ×2 IMPLANT
ELECT REM PT RETURN 9FT ADLT (ELECTROSURGICAL) ×2
ELECTRODE REM PT RTRN 9FT ADLT (ELECTROSURGICAL) ×1 IMPLANT
EVACUATOR SILICONE 100CC (DRAIN) ×2 IMPLANT
GAUZE SPONGE 2X2 8PLY STRL LF (GAUZE/BANDAGES/DRESSINGS) IMPLANT
GAUZE SPONGE 4X4 12PLY STRL (GAUZE/BANDAGES/DRESSINGS) ×2 IMPLANT
GLOVE BIOGEL PI IND STRL 7.0 (GLOVE) ×1 IMPLANT
GLOVE BIOGEL PI INDICATOR 7.0 (GLOVE) ×1
GLOVE ECLIPSE 8.0 STRL XLNG CF (GLOVE) ×2 IMPLANT
GLOVE INDICATOR 8.0 STRL GRN (GLOVE) ×4 IMPLANT
GOWN STRL REUS W/TWL LRG LVL3 (GOWN DISPOSABLE) ×2 IMPLANT
GOWN STRL REUS W/TWL XL LVL3 (GOWN DISPOSABLE) ×4 IMPLANT
KIT BASIN OR (CUSTOM PROCEDURE TRAY) ×2 IMPLANT
LIGASURE IMPACT 36 18CM CVD LR (INSTRUMENTS) ×2 IMPLANT
MARKER SKIN DUAL TIP RULER LAB (MISCELLANEOUS) IMPLANT
NEEDLE HYPO 22GX1.5 SAFETY (NEEDLE) ×2 IMPLANT
NS IRRIG 1000ML POUR BTL (IV SOLUTION) ×2 IMPLANT
PACK GENERAL/GYN (CUSTOM PROCEDURE TRAY) ×2 IMPLANT
SPONGE GAUZE 2X2 STER 10/PKG (GAUZE/BANDAGES/DRESSINGS) ×1
SPONGE LAP 18X18 X RAY DECT (DISPOSABLE) ×10 IMPLANT
SPONGE LAP 4X18 X RAY DECT (DISPOSABLE) IMPLANT
SUT MNCRL AB 3-0 PS2 18 (SUTURE) ×3 IMPLANT
SUT MNCRL AB 4-0 PS2 18 (SUTURE) IMPLANT
SUT PDS AB 1 CT1 27 (SUTURE) ×4 IMPLANT
SUT PROLENE 2 0 SH DA (SUTURE) ×1 IMPLANT
SUT VIC AB 2-0 SH 18 (SUTURE) ×20 IMPLANT
SUT VIC AB 3-0 SH 27 (SUTURE)
SUT VIC AB 3-0 SH 27XBRD (SUTURE) IMPLANT
SUT VIC AB 4-0 SH 18 (SUTURE) IMPLANT
SYR 20CC LL (SYRINGE) ×2 IMPLANT
TOWEL OR 17X26 10 PK STRL BLUE (TOWEL DISPOSABLE) ×2 IMPLANT

## 2015-05-25 NOTE — Op Note (Signed)
05/25/2015  11:45 AM  PATIENT:  Tina [name]  45 y.o. female  Patient Care Team: Anda Kraft, MD as PCP - General Servando Salina, MD as Consulting Physician (Obstetrics and Gynecology) Garvin Fila, MD as Consulting Physician (Neurology) [name] Craver, MD as Consulting Physician (Gastroenterology)  PRE-OPERATIVE DIAGNOSIS:    Chronic abdominal wall seroma, possible  Recurrent abdominal pain. Redundant skin/panniculus  POST-OPERATIVE DIAGNOSIS:    Chronic recurrent abdominal wall hernia sac Superficial fascial dehiscence but no evidence of hernia recurrence given large underlay mesh. Redundant panniculus with pain and irritation  PROCEDURE:    ABDOMINAL WALL EXPLORATION  EXCISION OF CHRONIC SEROMA CAVITY  PRIMARY REPAIR OF FASCIAL DISRUPTION Fascial nerve blocks PANNICULECTOMY OF REDUNDANT SKIN  APPLICATION OF incisional skin WOUND VAC  Surgeon(s): Michael Boston, MD  ASSISTANT: RN   ANESTHESIA:   local and general  EBL:  Total I/O In: 3000 [I.V.:3000] Out: 600 [Urine:200; Blood:400]  Delay start of Pharmacological VTE agent (>24hrs) due to surgical blood loss or risk of bleeding:  no  DRAINS: 70 [place] Blake drains and right and left subcutaneous abdominal wall.  The right drain tip is subfascial to help close off between fascia and underlay mesh/seroma cavity   SPECIMEN:  Source of Specimen:  1.  Seroma cavity nodule.  2.  Seroma cavity.  3.  Linear foreign body?  Hair versus retained nylon  4.  Panniculus with chronically irritated skin Weight 2.2 kg  DISPOSITION OF SPECIMEN:  PATHOLOGY  COUNTS:  YES  PLAN OF CARE: Admit for overnight observation  PATIENT DISPOSITION:  PACU - hemodynamically stable.  INDICATION: Pleasant morbidly obese female with infraumbilical incisional hernia status post primary open mesh repairs and underlay laparoscopic mesh repairs.  Has neurological and psychiatric issues.  Diabetes.  Struggled with severe mid sharp pains.   Has had her chronic right recurrent seroma cavity in the right panniculus of moderate size.  No improvement with drainage.  Her of infection at one point but resolved after antibiotics.  Question of possible eventration versus recurrent hernia.  I offered abdominal exploration with probable excision of seroma cavity and probable panniculectomy to get rid of redundant tissue and bring healthy tissue to help close down over drains.  Possible hernia repair.  Technique risk benefits alternatives discussed.  Risk of bowel injury and enterocutaneous fistula discussed.  Risk of hernia recurrence discussed.  Risks that this does not solve her abdominal pain as well discussed.  Questions answered and she agreed proceed.  The anatomy & physiology of the abdominal wall was discussed.  The pathophysiology of hernias was discussed.  Natural history risks without surgery including progeressive enlargement, pain, incarceration, & strangulation was discussed.   Contributors to complications such as smoking, obesity, diabetes, prior surgery, etc were discussed.   I feel the risks of no intervention will lead to serious problems that outweigh the operative risks; therefore, I recommended surgery to reduce and repair the hernia.  I explained laparoscopic techniques with possible need for an open approach.  I noted the probable use of mesh to patch and/or buttress the hernia repair  Risks such as bleeding, infection, abscess, need for further treatment, injury to other organs, need for repair of tissues / organs, stroke, heart attack, death, and other risks were discussed.  I noted a good likelihood this will help address the problem.   Goals of post-operative recovery were discussed as well.  Possibility that this will not correct all symptoms was explained.  I stressed the importance of  low-impact activity, aggressive pain control, avoiding constipation, & not pushing through pain to minimize risk of post-operative chronic  pain or injury. Possibility of reherniation especially with smoking, obesity, diabetes, immunosuppression, and other health conditions was discussed.  We will work to minimize complications.     An educational handout further explaining the pathology & treatment options was given as well.  Questions were answered.  The patient expresses understanding & wishes to proceed with surgery.   OR FINDINGS: 12 x 10 x 10 cm chronic seroma cavity in right lower quadrant coming out of a 8 x 6 cm fascial opening.  Mesh deeper to this intact, arguing against any evidence of hernia recurrence.  Chronic granulation tissue.  Most likely between onlay mesh and underlay laparoscopic mesh with a chronic cavity in between.  Mostly removed and debrided.  Fascia primarily reapproximated with PDS suture.  Moderate volume of redundant panniculus skin.  Excised.  62x17cm wound closed in layers with subcutaneous drains and incisional wound VAC.  DESCRIPTION: Informed consent was confirmed.  Patient underwent general anesthesia without difficulty.  She is supine with arms tucked.  She had a gentle bump in her lumbar region and to help expose her flanks.  Her abdomen panniculus and mons pubis are prepped and draped in a sterile fashion.  She was on appropriate antibiotics.  Surgical timeout confirmed our plan.  I refer to this CT scan intra-abdominal.  Made a long incision from the anterior axillary line in the suprapubic region on the left towards the right and excised.  Got down through the subcutaneous use tissues to the level of fascia.  I freed the panniculus off the fascia using LigaSure bipolar energy and focused cautery.  Came to an obvious moderate-sized seroma cavity in the right lower quadrant.  Came around it completely.  And a further mobilizing pannicular flaps to the posterior axillary lines on both sides.  I entered into the seroma cavity get clear fluid.  No evidence of any infection or pus.  2 cm spherical hard  nodule noted.  Smooth.  Sent separately.  Long black thin foreign body in the hernia sac as well.  Question of hair versus nylon.  Sent.  Hernia sac debris down the level fashion and excised.  Some redundant hernia sac debris did subfascially to ensure that mesh was intact.  I avoided over dissection.  I noted areas of Prolene suture from the prior tacking of the mesh.  I did feel blocks at each of these areas with liposomal bupivacaine/cord percent bupivacaine with epinephrine.  Did that at 5 locations in the infra umbilical region.   I excised the panniculus about 5 cm inferior to the umbilicus and continued it towards both flanks.  Weight and measured.  Over 2 mg in weight.  Excised and trimmed excess skin.  Range places noted above.  The right upper quadrant subcutaneous drain tip goes subfascial between the onlay and underlay mesh is to help close that seroma cavity down.  Fascial defect closed primarily using interrupted #1 PDS suture to good result.  Small opening allowed for 19 [place] Blake drain to exit without being too tight.  Also I did a thorough field block of local anesthetic around the closure of the fascial defect as well.  Wounds irrigated and flaps irrigated with clindamycin/gentamicin antibiotic irrigation.  Field blocks done on bilateral flanks  62 cm by 17 cm infraumbilical wound transversely reapproximated using 2-0 Vicryl interrupted deep dermal suture, 3-0 Vicryl subcuticular suture.  Incisional wound  VAC placed.  Patient extubated.  Abdominal wall and skin  much healthier.  Tissues look much better with redundant panniculus removed.  I discussed intraoperative findings and treatment to the patient's husband.  Postop instructions discussed.  Given the large volume of tissue removed and fascia repair, we will watch overnight.  Patient has issues often with weakness and confusion with any general anesthesia.  Hopefully she will recover well and go home in the morning.  Instructions are  written.    Adin Hector, M.D., F.A.C.S. Gastrointestinal and Minimally Invasive Surgery [facility] [address] [place], [place] [address] [phone] Main / Paging

## 2015-05-25 NOTE — Transfer of Care (Signed)
Immediate Anesthesia Transfer of Care Note  Patient: Tina [name]  Procedure(s) Performed: Procedure(s): ABDOMINAL WALL EXPLORATION EXCISION OF SEROMA REMOVAL OF REDUNDANT SKIN  (N/A) APPLICATION OF WOUND VAC (N/A)  Patient Location: PACU  Anesthesia Type:General  Level of Consciousness: awake, alert , oriented and patient cooperative  Airway & Oxygen Therapy: Patient Spontanous Breathing and Patient connected to face mask oxygen  Post-op Assessment: Report given to RN, Post -op Vital signs reviewed and stable and Patient moving all extremities X 4  Post vital signs: stable  Last Vitals:  Filed Vitals:   05/25/15 0542 05/25/15 1200  BP: 131/72 140/83  Pulse: 86 97  Temp: 36.8 C 37.3 C  Resp: 16 12    Last Pain:  Filed Vitals:   05/25/15 1203  PainSc: 3          Complications: No apparent anesthesia complications

## 2015-05-25 NOTE — Progress Notes (Signed)
Patient had CBG of 428, on clear liquid diet s/p surgery and had several icees. MD notified of results and ordered to give patient 20 units of novolog insulin and recheck blood sugar within the 1hr. Will monitor for symptoms of hyperglycemia. Patient continues to be alert and oriented.

## 2015-05-25 NOTE — Progress Notes (Signed)
Anesthesiology Note:  45 year old female with obesity, Type 2 DM, hypertension, and anxiety underwent paniculectomy and excision of abdominal wall seroma cavity. Preoperatively blood sugar was 149. Intraoperatively , she was given dexamethasone 10 mg because 3 attempts were required to intubate her and there was a concern for upper airway edema. In the PACU the initial blood glucose was 318 and she was given 10 mg sub Q novolog. After one hour glucose was 335. Decision then made to initiate glucomander with insulin infusion.  Will consult hospitalist service to help with glucomander management and transition to sub Q insulin.  Roberts Gaudy

## 2015-05-25 NOTE — Discharge Instructions (Signed)
HERNIA REPAIR: POST OP INSTRUCTIONS ° °1. DIET: Follow a light bland diet the first 24 hours after arrival home, such as soup, liquids, crackers, etc.  Be sure to include lots of fluids daily.  Avoid fast food or heavy meals as your are more likely to get nauseated.  Eat a low fat the next few days after surgery. °2. Take your usually prescribed home medications unless otherwise directed. °3. PAIN CONTROL: °a. Pain is best controlled by a usual combination of three different methods TOGETHER: °i. Ice/Heat °ii. Over the counter pain medication °iii. Prescription pain medication °b. Most patients will experience some swelling and bruising around the hernia(s) such as the bellybutton, groins, or old incisions.  Ice packs or heating pads (30-60 minutes up to 6 times a day) will help. Use ice for the first few days to help decrease swelling and bruising, then switch to heat to help relax tight/sore spots and speed recovery.  Some people prefer to use ice alone, heat alone, alternating between ice & heat.  Experiment to what works for you.  Swelling and bruising can take several weeks to resolve.   °c. It is helpful to take an over-the-counter pain medication regularly for the first few weeks.  Choose one of the following that works best for you: °i. Naproxen (Aleve, etc)  Two 220mg tabs twice a day °ii. Ibuprofen (Advil, etc) Three 200mg tabs four times a day (every meal & bedtime) °iii. Acetaminophen (Tylenol, etc) 325-650mg four times a day (every meal & bedtime) °d. A  prescription for pain medication should be given to you upon discharge.  Take your pain medication as prescribed.  °i. If you are having problems/concerns with the prescription medicine (does not control pain, nausea, vomiting, rash, itching, etc), please call us [phone] to see if we need to switch you to a different pain medicine that will work better for you and/or control your side effect better. °ii. If you need a refill on your pain  medication, please contact your pharmacy.  They will contact our office to request authorization. Prescriptions will not be filled after 5 pm or on week-ends. °4. Avoid getting constipated.  Between the surgery and the pain medications, it is common to experience some constipation.  Increasing fluid intake and taking a fiber supplement (such as Metamucil, Citrucel, FiberCon, MiraLax, etc) 1-2 times a day regularly will usually help prevent this problem from occurring.  A mild laxative (prune juice, Milk of Magnesia, MiraLax, etc) should be taken according to package directions if there are no bowel movements after 48 hours.   °5. Wash / shower every day.  You may shower over the dressings as they are waterproof.   °6. Remove your waterproof bandages 5 days after surgery.  You may leave the incision open to air.  You may replace a dressing/Band-Aid to cover the incision for comfort if you wish.  Continue to shower over incision(s) after the dressing is off. ° ° ° °7. ACTIVITIES as tolerated:   °a. You may resume regular (light) daily activities beginning the next day--such as daily self-care, walking, climbing stairs--gradually increasing activities as tolerated.  If you can walk 30 minutes without difficulty, it is safe to try more intense activity such as jogging, treadmill, bicycling, low-impact aerobics, swimming, etc. °b. Save the most intensive and strenuous activity for last such as sit-ups, heavy lifting, contact sports, etc  Refrain from any heavy lifting or straining until you are off narcotics for pain control.   °  c. DO NOT PUSH THROUGH PAIN.  Let pain be your guide: If it hurts to do something, don't do it.  Pain is your body warning you to avoid that activity for another week until the pain goes down. d. You may drive when you are no longer taking prescription pain medication, you can comfortably wear a seatbelt, and you can safely maneuver your car and apply brakes. e. Dennis Bast may have sexual intercourse  when it is comfortable.  8. FOLLOW UP in our office a. Please call [facility] at [phone] to set up an appointment to see your surgeon in the office for a follow-up appointment approximately 2-3 weeks after your surgery. b. Make sure that you call for this appointment the day you arrive home to insure a convenient appointment time. 9.  IF YOU HAVE DISABILITY OR FAMILY LEAVE FORMS, BRING THEM TO THE OFFICE FOR PROCESSING.  DO NOT GIVE THEM TO YOUR DOCTOR.  WHEN TO CALL us [phone]: 1. Poor pain control 2. Reactions / problems with new medications (rash/itching, nausea, etc)  3. Fever over 101.5 F (38.5 C) 4. Inability to urinate 5. Nausea and/or vomiting 6. Worsening swelling or bruising 7. Continued bleeding from incision. 8. Increased pain, redness, or drainage from the incision   The clinic staff is available to answer your questions during regular business hours (8:30am-5pm).  Please dont hesitate to call and ask to speak to one of our nurses for clinical concerns.   If you have a medical emergency, go to the nearest emergency room or call 911.  A surgeon from [facility] [facility] is always on call at the hospitals in [facility], [place], [place], [place], [place]  [address] ?  [address] [address], [place], [place]   [address] MAIN: [phone] ? TOLL FREE: [phone] ? FAX: [phone] [email or web address]  Vacuum-Assisted Closure Therapy Vacuum-assisted closure (VAC) therapy uses a device that removes fluid and germs from wounds to help them heal. It is used on wounds that cannot be closed with stitches. They often heal slowly. Vacuum-assisted therapy helps the wound stay clean and healthy while the open wound slowly grows back together. Vacuum-assisted closure therapy uses a bandage (dressing) that is made of foam. It is put inside the wound. Then, a drape is placed over the wound. This drape sticks to your skin to keep  air out, and to protect the wound. A tube is hooked up to a small pump and is attached to the drape. The pump sucks out the fluid and germs. Vacuum-assisted closure therapy can also help reduce the bad smell that comes from the wound. HOW DOES IT WORK?  The vacuum pump pulls fluid through the foam dressing. The dressing may wrinkle during this process. The fluid goes into the tube and away from the wound. The fluid then goes into a container. The fluid in the container must be replaced if it is full or at least once a week, even if the container is not full. The pulling from the pump helps to close the wound and bring better circulation to the wound area. The foam dressing covers and protects the wound. It helps your wound heal faster.  HOW DOES IT FEEL?   You might feel a little pulling when the pump is on.  You might also feel a mild vibrating sensation.  You might feel some discomfort when the dressing is taken off. CAN I MOVE AROUND WITH VACUUM-ASSISTED CLOSURE THERAPY? Yes, it  has a backup battery which is used when the machine is not plugged in, as long as the battery is working, you can move freely. WHAT ARE SOME THINGS I MUST KNOW?  Do not turn off the pump yourself, unless instructed to do so by your healthcare provider, such as for bathing.  Do not take off the dressing yourself, unless instructed to do so by your caregiver.  You can wash or shower with the dressing. However, do not take the pump into the shower. Make sure the wound dressing is protected and covered with plastic. The wound area must stay dry.  Do not turn off the pump for more than 2 hours. If the pump is off for more than 2 hours, your nurse must change your dressing.  Check frequently that the machine is on, that the machine indicates the therapy is on, and that all clamps are open. THE ALARM IS SOUNDING! WHAT SHOULD I DO?   Stay calm.  Do not turn off the pump or do anything with the dressing.  Call your  clinic or caregiver right away if the alarm goes off and you cannot fix the problem. Some reasons the alarm might go off include:  The fluid collection container is full.  The battery is low.  The dressing has a leak.  Explain to your caregiver what is happening. Follow the instructions you receive. WHEN SHOULD I CALL FOR HELP?   You have severe pain.  You have difficulty breathing.  You have bleeding that will not stop.  Your wound smells bad.  You have redness, swelling, or fluid leaking from your wound.  Your alarm goes off and you do not know what to do.  You have a fever.  Your wound itches severely.  Your dressing changes are often painful or bleeding often occurs.  You have diarrhea.  You have a sore throat.  You have a rash around the dressing or anywhere else on your body.  You feel nauseous.  You feel dizzy or weak.  The [facility] machine has been off for more than 2 hours. HOW DO I GET READY TO GO HOME WITH A PUMP?  A trained caregiver will talk to you and answer your questions about your vacuum-assisted closure therapy before you go home. He or she will explain what to expect. A caregiver will come to your home to apply the pump and care for your wound. The at-home caregiver will be available for questions and will come back for the scheduled dressing changes, usually every 48-72 hours (or more often for severely infected wounds). Your at-home caregiver will also come if you are having an unexpected problem. If you have questions or do not know what to do when you go home, talk to your healthcare provider.   This information is not intended to replace advice given to you by your health care provider. Make sure you discuss any questions you have with your health care provider.   Document Released: 12/13/2007 Document Revised: 09/01/2012 Document Reviewed: 12/13/2010 Elsevier Interactive Patient Education 2016 [employer].  Managing Pain  Pain after surgery or  related to activity is often due to strain/injury to muscle, tendon, nerves and/or incisions.  This pain is usually short-term and will improve in a few months.   Many people find it helpful to do the following things TOGETHER to help speed the process of healing and to get back to regular activity more quickly:  1. Avoid heavy physical activity at first a. No  lifting greater than 20 pounds at first, then increase to lifting as tolerated over the next few weeks b. Do not push through the pain.  Listen to your body and avoid positions and maneuvers than reproduce the pain.  Wait a few days before trying something more intense c. Walking is okay as tolerated, but go slowly and stop when getting sore.  If you can walk 30 minutes without stopping or pain, you can try more intense activity (running, jogging, aerobics, cycling, swimming, treadmill, sex, sports, weightlifting, etc ) d. Remember: If it hurts to do it, then dont do it!  2. Take Anti-inflammatory medication a. Choose ONE of the following over-the-counter medications: i.            Acetaminophen 500mg  tabs (Tylenol) 1-2 pills with every meal and just before bedtime (avoid if you have liver problems) ii.            Naproxen 220mg  tabs (ex. Aleve) 1-2 pills twice a day (avoid if you have kidney, stomach, IBD, or bleeding problems) iii. Ibuprofen 200mg  tabs (ex. Advil, Motrin) 3-4 pills with every meal and just before bedtime (avoid if you have kidney, stomach, IBD, or bleeding problems) b. Take with food/snack around the clock for 1-2 weeks i. This helps the muscle and nerve tissues become less irritable and calm down faster  3. Use a Heating pad or Ice/Cold Pack a. 4-6 times a day b. May use warm bath/hottub  or showers  4. Try Gentle Massage and/or Stretching  a. at the area of pain many times a day b. stop if you feel pain - do not overdo it  Try these steps together to help you body heal faster and avoid making things get worse.   Doing just one of these things may not be enough.    If you are not getting better after two weeks or are noticing you are getting worse, contact our office for further advice; we may need to re-evaluate you & see what other things we can do to help.

## 2015-05-25 NOTE — Anesthesia Procedure Notes (Signed)
Procedure Name: Intubation Date/Time: 05/25/2015 7:38 AM Performed by: Lissa Morales Pre-anesthesia Checklist: Patient identified, Emergency Drugs available, Suction available and Patient being monitored Patient Re-evaluated:Patient Re-evaluated prior to inductionOxygen Delivery Method: Circle System Utilized Preoxygenation: Pre-oxygenation with 100% oxygen Intubation Type: IV induction Ventilation: Two handed mask ventilation required and Nasal airway inserted- appropriate to patient size Laryngoscope Size: Miller and 3 Tube type: Oral Tube size: 7.5 mm Number of attempts: 2 Airway Equipment and Method: Stylet and Oral airway Placement Confirmation: ETT inserted through vocal cords under direct vision,  positive ETCO2 and breath sounds checked- equal and bilateral Secured at: 21 cm Tube secured with: Tape Dental Injury: Teeth and Oropharynx as per pre-operative assessment  Difficulty Due To: Difficulty was unanticipated, Difficult Airway- due to anterior larynx, Difficult Airway- due to limited oral opening and Difficult Airway- due to large tongue Comments: Crna attempted x 1 blind pass with MAC 4  Unsuccessful. Unable to lift  Epiglottis with MAC blade., small mouth, large tongue,anterior larynx. Dr. Linna Caprice  Used [name] Heck 3 to intubate successfully

## 2015-05-25 NOTE — Interval H&P Note (Signed)
History and Physical Interval Note:  05/25/2015 6:56 AM  Tina [name]  has presented today for surgery, with the diagnosis of Chronic abdominal wall seroma and redundand skin/panniculus  The various methods of treatment have been discussed with the patient and family. After consideration of risks, benefits and other options for treatment, the patient has consented to  Procedure(s): ABDOMINAL WALL EXPLORATION EXCISION OF SEROMA REMOVAL OF REDUNDANT SKIN  (N/A) as a surgical intervention .  The patient's history has been reviewed, patient examined, no change in status, stable for surgery.  I have reviewed the patient's chart and labs.  Questions were answered to the patient's satisfaction.     Izabel Chim C.

## 2015-05-25 NOTE — Anesthesia Preprocedure Evaluation (Signed)

## 2015-05-25 NOTE — Anesthesia Postprocedure Evaluation (Signed)
Anesthesia Post Note  Patient: Tina [name]  Procedure(s) Performed: Procedure(s) (LRB): ABDOMINAL WALL EXPLORATION EXCISION OF SEROMA REMOVAL OF REDUNDANT SKIN  (N/A) APPLICATION OF WOUND VAC (N/A)  Patient location during evaluation: PACU Anesthesia Type: General Level of consciousness: awake, awake and alert and oriented Pain management: pain level controlled Vital Signs Assessment: post-procedure vital signs reviewed and stable Respiratory status: spontaneous breathing, respiratory function stable and nonlabored ventilation Cardiovascular status: blood pressure returned to baseline Anesthetic complications: no    Last Vitals:  Filed Vitals:   05/25/15 1330 05/25/15 1345  BP: 145/71 142/70  Pulse: 94 91  Temp:    Resp: 15 14    Last Pain:  Filed Vitals:   05/25/15 1349  PainSc: Asleep                 Keenen Roessner COKER

## 2015-05-25 NOTE — Consult Note (Signed)
Medical Consultation   Tina [name]  [id number]  DOB: [birth year]  DOA: 05/25/2015  PCP: Dwan Bolt, MD   [facility]   Requesting physician: Dr Johney Maine  Reason for consultation: Management of diabetes.   History of Present Illness: Tina [name] is an 45 y.o. female with a past medical history of morbid obesity, type 2 diabetes mellitus, hypertension, history of recurrent ventral incisional hernia status post closure and repair with mesh in 2015, who was taken to the operating room today for chronic recurrent abdominal wall hernia. She underwent abdominal wall exploration with excision of chronic seroma cavity and primary repair of fascial disruption. Postoperatively she was found to have blood sugar of 335 for which general surgery recommends medicine consultation for management of diabetes. At home she had been on Lantus 15 units in a.m., 10 units in p.m. along with Victoza 1.2mg  daily.    Review of Systems:  ROS As per HPI otherwise 10 point review of systems negative.     Past Medical History: Past Medical History  Diagnosis Date  . Spastic hemiplegia affecting dominant side ([place])   . Panic disorder without agoraphobia   . Depression   . Anxiety   . Diabetes mellitus   . Hypertension   . Blood transfusion     IN 2012  AFTER C -SECTION  . Right rotator cuff tear     PAIN IN RIGHT SHOULDER  . Ventral hernia     RIGHT LOWER QUADRANT-CAUSING SOME PAIN  . Anemia     DURING MENSES--HAS HEAVY BLEEDING WITH PERODS  . Headache(784.0)     MIGRAINES--NOT REALLY HEADACHE-MORE LIKE PRESSURE SENSATION IN HEAD-FEELS DIZZIY AND  FAINT AS THE PRESSURE RESOLVES  . Diabetic neuropathy ([place])     BOTH FEET --COMES AND GOES  . Restless leg syndrome     DIAGNOSED BY SLEEP STUDY - PT TOLD SHE DID NOT HAVE SLEEP APNEA  . Rash     HANDS, ARMS --STATES HX OF RASH EVER SINCE CHILDBIRTH/PREGNANCY.  STATES THE RASH OFTEN OCCURS WHEN SHE IS  REALLY STRESSED."goes and comes-presently left ring finger"  . Hernia, incisional, RLQ, s/p lap repair Sep 2013 09/02/2011  . SBO (small bowel obstruction) ([place]) 06/03/2012  . Back pain, chronic     "ongoing"  . Leg pain, right     "like bad Charley horse"  . Hx of migraines 10/19/2011  . Cerebral thrombosis with cerebral infarction [facility]) JUNE 2011    RIGHT SIDED WEAKNESS ( ARM AND LEG ) AND SPASMS-remains with slight weakness and vertigo.  . Stroke ([place])   . Endometriosis     Past Surgical History: Past Surgical History  Procedure Laterality Date  . Cesarean section  2012  . Uterine fibroid surgery      2 SURGERIES FOR FIBROIDS  . Ureter revision      Bilateral "twisted"  . Ventral hernia repair  10/03/2011    Procedure: LAPAROSCOPIC VENTRAL HERNIA;  Surgeon: Adin Hector, MD;  Location: [facility] ORS;  Service: General;  Laterality: N/A;  . Hernia repair  10/03/11    ventral hernia repair  . Esophagogastroduodenoscopy N/A 06/03/2012    Procedure: ESOPHAGOGASTRODUODENOSCOPY (EGD);  Surgeon: [name] Craver, MD;  Location: [facility] ENDOSCOPY;  Service: Endoscopy;  Laterality: N/A;  . Diagnostic laparoscopy    . Umbilical hernia repair N/A 02/04/2013    Procedure: LAPAROSCOPIC ventral wall hernia repair LAPAROSCOPIC LYSIS OF ADHESIONS laparoscopic exploration  of abdomen ;  Surgeon: Adin Hector, MD;  Location: [facility] ORS;  Service: General;  Laterality: N/A;  . Insertion of mesh N/A 02/04/2013    Procedure: INSERTION OF MESH;  Surgeon: Adin Hector, MD;  Location: [facility] ORS;  Service: General;  Laterality: N/A;  . Eye surgery      Eye laser for vessel hemorrhaging     Allergies:   Allergies  Allergen Reactions  . Contrast Media [Iodinated Diagnostic Agents] Other (See Comments)    Difficulty breathing  . Iohexol Hives, Nausea And Vomiting and Swelling     Desc: Magnevist-gadolinium-difficulty breathing, throat swelling   . Midazolam Hcl Anaphylaxis    Difficulty breathing  . Shellfish  Allergy Anaphylaxis  . Metformin And Related Diarrhea and Nausea And Vomiting  . Other Itching    Patient is allergic to all nuts except peanuts.   . Avandia [Rosiglitazone Maleate] Hives and Other (See Comments)  . Geodon [Ziprasidone] Other (See Comments)    UNKNOWN  . Kiwi Extract Itching and Swelling  . Latex Itching     Social History:  reports that she has never smoked. She has never used smokeless tobacco. She reports that she does not drink alcohol or use illicit drugs.   Family History: Family History  Problem Relation Age of Onset  . Diabetes Father   . Kidney disease Father   . Other Neg Hx   . Diabetes Maternal Grandmother   . Hyperlipidemia Paternal Grandmother   . Stroke Paternal Grandmother      Physical Exam: Filed Vitals:   05/25/15 1300 05/25/15 1315 05/25/15 1330 05/25/15 1345  BP: 148/73 141/72 145/71 142/70  Pulse: 95 102 94 91  Temp:  98.7 F (37.1 C)    TempSrc:      Resp: 10 17 15 14   Height:      Weight:      SpO2: 95% 96% 96% 96%    Constitutional: No acute distress, nontoxic appearing. Eyes: PERLA, EOMI, irises appear normal, anicteric sclera,  ENMT: external ears and nose appear normal,             Lips appears normal, oropharynx mucosa, tongue, posterior pharynx appear normal  Neck: neck appears normal, no masses, normal ROM, no thyromegaly, no JVD  CVS: S1-S2 clear, no murmur rubs or gallops, no LE edema, normal pedal pulses  Respiratory:  clear to auscultation bilaterally, no wheezing, rales or rhonchi. Respiratory effort normal. No accessory muscle use.  Abdomen: Status post abdominal surgery, abdomen is tender to palpation Musculoskeletal: : no cyanosis, clubbing or edema noted bilaterally Neuro: Cranial nerves II-XII intact, strength, sensation, reflexes Psych: judgement and insight appear normal, stable mood and affect, mental status Skin: no rashes or lesions or ulcers, no induration or nodules   Data reviewed:  I have  personally reviewed following labs and imaging studies Labs:  CBC: No results for input(s): WBC, NEUTROABS, HGB, HCT, MCV, PLT in the last 168 hours.  Basic Metabolic Panel: No results for input(s): NA, K, CL, CO2, GLUCOSE, BUN, CREATININE, CALCIUM, MG, PHOS in the last 168 hours. GFR Estimated Creatinine Clearance: 97.4 mL/min (by C-G formula based on Cr of 0.92). Liver Function Tests: No results for input(s): AST, ALT, ALKPHOS, BILITOT, PROT, ALBUMIN in the last 168 hours. No results for input(s): LIPASE, AMYLASE in the last 168 hours. No results for input(s): AMMONIA in the last 168 hours. Coagulation profile No results for input(s): INR, PROTIME in the last 168 hours.  Cardiac Enzymes: No  results for input(s): CKTOTAL, CKMB, CKMBINDEX, TROPONINI in the last 168 hours. BNP: Invalid input(s): POCBNP CBG:  Recent Labs Lab 05/25/15 0545 05/25/15 1205 05/25/15 1318 05/25/15 1432  GLUCAP 149* 318* 335* 315*   D-Dimer No results for input(s): DDIMER in the last 72 hours. Hgb A1c No results for input(s): HGBA1C in the last 72 hours. Lipid Profile No results for input(s): CHOL, HDL, LDLCALC, TRIG, CHOLHDL, LDLDIRECT in the last 72 hours. Thyroid function studies No results for input(s): TSH, T4TOTAL, T3FREE, THYROIDAB in the last 72 hours.  Invalid input(s): FREET3 Anemia work up No results for input(s): VITAMINB12, FOLATE, FERRITIN, TIBC, IRON, RETICCTPCT in the last 72 hours. Urinalysis    Component Value Date/Time   COLORURINE YELLOW 02/02/2015 1915   APPEARANCEUR CLEAR 02/02/2015 1915   LABSPEC 1.016 02/02/2015 1915   PHURINE 5.5 02/02/2015 1915   GLUCOSEU >1000* 02/02/2015 1915   HGBUR MODERATE* 02/02/2015 1915   BILIRUBINUR NEGATIVE 02/02/2015 1915   KETONESUR NEGATIVE 02/02/2015 1915   PROTEINUR NEGATIVE 02/02/2015 1915   UROBILINOGEN 0.2 08/18/2014 1423   NITRITE NEGATIVE 02/02/2015 1915   LEUKOCYTESUR NEGATIVE 02/02/2015 1915     Microbiology No  results found for this or any previous visit (from the past 240 hour(s)).     Inpatient Medications:   Scheduled Meds: . bupivacaine liposome  20 mL Infiltration On Call to OR  . chlorhexidine  1 application Topical Once  . [START ON 05/26/2015] chlorhexidine  1 application Topical Once  . clindamycin / gentamicin INTRAPERITONEAL Lavage irrigation   Intraperitoneal To OR  . insulin aspart       Continuous Infusions: . insulin (NOVOLIN-R) infusion 2.8 Units/hr (05/25/15 1412)     Radiological Exams on Admission: No results found.  Impression/Recommendations Active Problems:   Diabetes mellitus type II, uncontrolled (HCC)   Abdominal wall mass of right lower quadrant  1.  Insulin dependent diabetes mellitus. Mrs. Canipe is a 45 year old with a history of insulin for diabetes mellitus undergoing abdominal surgery today. She reported not taking her a.m. insulin. Postoperatively she was found to have blood sugars in the 300 range and had been started on an insulin drip. I would recommend stopping insulin drip at this time and giving dose of Lantus 15 units subcutaneous x 1, with NovoLog for mealtime coverage and sliding scale every 4 hours. Blood sugars likely elevated due to not taking insulin as well as today's surgical procedure. Tomorrow she can start her normal regimen with Lantus 15 units in a.m. and at 10 units in p.m. Diabetic coordinator consulted as well.  2.  History of hypertension. She is on amlodipine 10 mg by mouth daily and irbesartan 3 mg by mouth daily, recommend restarting tomorrow as postoperatively blood pressures were soft.   Thank you for this consultation.  Our [facility] hospitalist team will follow the patient with you.   Time Spent: 40 min  Kelvin Cellar M.D. [facility] 05/25/2015, 2:41 PM

## 2015-05-25 NOTE — H&P (Signed)
Tina [name] 03/20/2015 9:35 AM Location: [facility] Patient #: [id number] DOB: May 10, 1970 Married / Language: English / Race: Black or African American Female   History of Present Illness  The patient is a 46 year old female who presents with abdominal pain. Pleasant morbidly obese female with chronic pain and other health issues. Developed incisional hernia that was repaired. Develop recurrence. We repaired. Struggled with a postoperative seroma and pain. Seroma aspirated once. Negative for infection. Hesitatant to have any nerve blocks or other interventions. I saw her last in August. She wished to try Tylenol. She still has pain in her right lower quadrant underneath her panniculus. She's been trying to lose weight but seems to get a new problem that requires treatment that counteracts. Had a gout flare on her big toe. Placed on steroid taper. Gained weight back again. She seems more open to the idea of having an intervention done to help control her pain. She is trying to taper off her pain medicines. Trying to avoid narcotics. She is eating well. Occasional loose bowel movements but not severe. Trying to walk and exercise more.  CT scan done revealed significant seroma and old hernia cavity and right lower quadrant. Some fluid between over the new mesh and preperitoneal space as well. She underwent CT-guided drainage. Cultures were negative for infection. Drainage output was high at first but then tapered off to less than 10 cc a day for the past 3 days. Flushed well. CT scan showed resolution of one of the seromas. Drain removed since low volume. Persistent deeper abdominal wall seroma connecting into preperitoneal space. She recalls the radiology discusses about ultrasound-guided drainage of that seroma in the next week.  She missed appointment to have drainage of deeper collection. Developed nausea and vomiting and diarrhea. Concern for  gastroenteritis. Admitted. Went ahead and had one of the collections drained. Did not drain much. Then had worsening pain and irritation at the drain. Drain upsized. Had worsening pain around drain site. I removed it last month. Statrted her on 6 weeks course on doxycycline antibiotics. She felt better.  I discussed if she had worsening symptoms, consider operative drainage or removal even though increased mesh risks. She disappeared.  Patient comes back 9 months from the last visit noting that she still has chronic abdominal pain. Also was some new suprapubic pain and worsening menorrhagia. Concern for worsening fibroids in her uterus. Due to see gynecology again to see if would benefit from a partial hysterectomy given the fact that she cannot take hormonal therapy with her history of stroke. She is intentionally lost some weight but remains morbidly obese.     Procedure (Date: 02/04/2013):  POST-OPERATIVE DIAGNOSIS: Recurrent ventral wall incisional hernia  PROCEDURE: Procedure(s):  LAPAROSCOPIC LYSIS OF ADHESIONS 31min (1/2 case)  LAPAROSCOPIC ventral wall hernia repair  INSERTION OF MESH  SURGEON: Surgeon(s):  Adin Hector, MD  OR FINDINGS: Poor incorporation of central part of mesh with persistent hernia sac. Seromas. No abscess. Gram stain negative for organisms.  8 x 7 recurrent area of hernia. Outer 2/3 rim of old Physiomesh well incorporated.  Dense adhesions of small bowel to central part of mesh. Some incorporation. No fistula.  Type of repair - Laparoscopic underlay repair  Name of mesh - Bard Ventralight dual sided (polypropylene / Seprafilm)  Size of mesh - Length 33 cm, Width 25 cm  Mesh overlap - 7-10 cm  Placement of mesh - Intraperitoneal underlay repair  Anaerobic culture Status: Final result Visible  to patient: This result is viewable by the patient in [place]. Next appt: 08/15/2013 at 02:45 PM in Physical Medicine and Rehabilitation  [name], MD)      3wk ago  Specimen Description ABSCESS ABDOMEN HERNIA [facility] Special Requests PATIENT ON FOLLOWING FLAGYL Gram Stain RARE WBC NO ORGANISMS SEEN Performed at [facility] Culture NO ANAEROBES ISOLATED Performed at [facility] Report Status 02/09/2013 FINAL Resulting Agency [facility]     Problem List/Past Medical Adin Hector, MD; 03/20/2015 10:16 AM) ABDOMINAL PAIN, CHRONIC, RIGHT LOWER QUADRANT (R10.31) ABDOMINAL WALL SEROMA, SEQUELA (T88.8XXS) POSTOPERATIVE SEROMA, INITIAL ENCOUNTER (T88.8XXA)  Other Problems Adin Hector, MD; 03/20/2015 10:16 AM) Anxiety Disorder Arthritis Back Pain Cerebrovascular Accident Chest pain Depression Diabetes Mellitus High blood pressure Inguinal Hernia Migraine Headache Transfusion history  Past Surgical History Adin Hector, MD; 03/20/2015 10:16 AM) Colon Polyp Removal - Colonoscopy  Diagnostic Studies History Adin Hector, MD; 03/20/2015 10:16 AM) Colonoscopy 5-10 years ago Mammogram within last year Pap Smear 1-5 years ago  Allergies Marjean Donna, CMA; 03/20/2015 9:35 AM) Shellfish-derived Products Iodinated Contrast Media Iohexol *DIAGNOSTIC PRODUCTS* Midazolam *CHEMICALS* Avandia *ANTIDIABETICS* Geodon *ANTIPSYCHOTICS/ANTIMANIC AGENTS* KIWI FRUIT MetFORMIN HCl *ANTIDIABETICS*  Medication History (Sonya Bynum, CMA; 03/20/2015 9:36 AM) Doxycycline Hyclate (100MG  Tablet, 1 (one) Tablet Tablet Oral two times daily, Taken starting 03/23/2014) Active. HYDROmorphone HCl (2MG  Tablet, Oral) Active. TraMADol HCl (50MG  Tablet, Oral) Active. Lyrica (75MG  Capsule, Oral) Active. Advocate Control Solution (Low Liquid, In Vitro) Active. Advocate Redi-Code+ Active. Pharmacist Choice Alcohol Active. Amlodipine-Valsartan-HCTZ (10-320-25MG  Tablet, Oral) Active. Amoxicillin (875MG  Tablet, Oral) Active. BD Pen Needle Short U/F (31G X 8 MM Misc,)  Active. Bystolic (10MG  Tablet, Oral) Active. Cephalexin (500MG  Capsule, Oral) Active. Chlorhexidine Gluconate (0.12% Solution, Mouth/Throat) Active. Colchicine (0.6MG  Tablet, Oral) Active. HumaLOG KwikPen (100UNIT/ML Soln Pen-inj, Subcutaneous) Active. Furosemide (40MG  Tablet, Oral) Active. Indomethacin ER (75MG  Capsule ER, Oral) Active. Ibuprofen (800MG  Tablet, Oral prn) Active. Invokana (300MG  Tablet, Oral) Active. Lantus SoloStar (100UNIT/ML Soln Pen-inj, Subcutaneous) Active. NovoLOG FlexPen (100UNIT/ML Soln Pen-inj, Subcutaneous) Active. OneTouch Ultra Blue (In Vitro) Active. Pharmacist Choice Lancets Active. TiZANidine HCl (4MG  Tablet, Oral) Active. Victoza (18MG /3ML Soln Pen-inj, Subcutaneous) Active. PredniSONE (10MG  Tablet, Oral) Active. Medications Reconciled  Social History Adin Hector, MD; 03/20/2015 10:16 AM) Alcohol use Remotely quit alcohol use. Caffeine use Coffee. No drug use Tobacco use Never smoker.  Family History Adin Hector, MD; 03/20/2015 10:16 AM) Alcohol Abuse Mother, Sister. Depression Mother, Sister. Diabetes Mellitus Father. Kidney Disease Father. Migraine Headache Mother, Sister.  Pregnancy / Birth History Adin Hector, MD; 03/20/2015 10:16 AM) Age at menarche 56 years. Gravida 2 Maternal age 64-20 Para 1 Regular periods    Review of Systems Adin Hector, MD; 03/20/2015 10:20 AM) General Present- Chills and Fatigue. Not Present- Appetite Loss, Fever, Night Sweats, Weight Gain and Weight Loss. Skin Present- Rash. Not Present- Change in Wart/Mole, Dryness, Hives, Jaundice, New Lesions, Non-Healing Wounds and Ulcer. HEENT Present- Ringing in the Ears, Seasonal Allergies and Wears glasses/contact lenses. Not Present- Earache, Hearing Loss, Hoarseness, Nose Bleed, Oral Ulcers, Sinus Pain, Sore Throat, Visual Disturbances and Yellow Eyes. Respiratory Present- Difficulty Breathing. Not Present- Bloody sputum,  Chronic Cough, Snoring and Wheezing. Breast Present- Breast Pain. Not Present- Breast Mass, Nipple Discharge and Skin Changes. Cardiovascular Present- Leg Cramps, Rapid Heart Rate and Swelling of Extremities. Not Present- Chest Pain, Difficulty Breathing Lying Down, Palpitations and Shortness of Breath. Gastrointestinal Present- Abdominal Pain, Bloating, Constipation, Nausea and Vomiting. Not Present- Bloody Stool, Change in  Bowel Habits, Chronic diarrhea, Difficulty Swallowing, Excessive gas, Gets full quickly at meals, Hemorrhoids, Indigestion and Rectal Pain. Musculoskeletal Present- Back Pain. Not Present- Joint Pain, Joint Stiffness, Muscle Pain, Muscle Weakness and Swelling of Extremities. Neurological Present- Headaches and Weakness. Not Present- Decreased Memory, Fainting, Numbness, Seizures, Tingling, Tremor and Trouble walking. Psychiatric Present- Anxiety and Depression. Not Present- Bipolar, Change in Sleep Pattern, Fearful and Frequent crying. Endocrine Present- Hair Changes. Not Present- Cold Intolerance, Excessive Hunger, Heat Intolerance, Hot flashes and New Diabetes.  Vitals  BP 131/72 mmHg  Pulse 86  Temp(Src) 98.2 F (36.8 C) (Oral)  Resp 16  Ht 5' 6.5" (1.689 m)  Wt 109.033 kg (240 lb 6 oz)  BMI 38.22 kg/m2  SpO2 100%  LMP 04/28/2015        Physical Exam  General Mental Status-Alert. General Appearance-Not in acute distress. Voice-Normal.  Integumentary Global Assessment Normal Exam - Distribution of scalp and body hair is normal. General Characteristics Overall Skin Surface - no rashes and no suspicious lesions.  Head and Neck Head-normocephalic, atraumatic with no lesions or palpable masses. Face Global Assessment - atraumatic, no absence of expression. Neck Global Assessment - no abnormal movements, no decreased range of motion. Trachea-midline. Thyroid Gland Characteristics - non-tender.  Eye Eyeball - Left-Extraocular movements  intact, No Nystagmus. Eyeball - Right-Extraocular movements intact, No Nystagmus. Upper Eyelid - Left-No Cyanotic. Upper Eyelid - Right-No Cyanotic. Note: Again mildly droopy eyes but alert.   Chest and Lung Exam Inspection Accessory muscles - No use of accessory muscles in breathing.  Abdomen Note: Morbidly obese but soft. Mild/moderate soreness and fullness in RIGHT lower quadrant over large lower panniculus involving most of that RIGHT side. No evidence of hernia recurrence   Peripheral Vascular Upper Extremity Inspection - Left - Not Gangrenous, No Petechiae. Right - Not Gangrenous, No Petechiae.  Neurologic Neurologic evaluation reveals -normal attention span and ability to concentrate, able to name objects and repeat phrases. Appropriate fund of knowledge and normal coordination.  Neuropsychiatric Mental status exam performed with findings of-able to articulate well with normal speech/language, rate, volume and coherence and no evidence of hallucinations, delusions, obsessions or homicidal/suicidal ideation. Orientation-oriented X3.  Musculoskeletal Global Assessment Gait and Station - normal gait and station.  Lymphatic General Lymphatics Description - No Generalized lymphadenopathy.    Assessment & Plan Adin Hector MD; 03/20/2015 1:11 PM) ABDOMINAL PAIN, CHRONIC, RIGHT LOWER QUADRANT (R10.31) Impression: I suspect this is probably due to persistent seroma. Superficial pocket been able to be drained but deeper pocket not so much. Been an issue of having compliance with drain replacement and antibiotic use.  I'm concerned that the deep pocket is too loculated to drain. Percutaneous drainage has failed. I'm concerned that she may need more aggressive debridement with operation and loculation breakup with open wound or new surgical drain. Obvious concern is that the mesh could get infected with this.  At this point, I can offer her lower panniculectomy  including the seroma to help simplify the skin in the region. Get rid of the seroma since it seems to cause pain with distention. Trying close it with an incisional wound VAC. I could try diagnostic laparoscopy, but would like to hold off to avoid any bowel injury (numerous prior surgeries with giant mesh. If there is a hysterectomy planned in the future, it could be a combined case.  At this point, I will hold off on final surgical plans until the patient states gynecology to see with the plan as. She  will let me know.  There is no strong evidence of recurrent hernia at this time. She already has a pain specialist that she is following. Current Plans Pt Education - [facility] Free Text Education/Instructions: discussed with patient and provided information. Pt Education - [facility] Pain Control (Zniya Cottone) ABDOMINAL WALL SEROMA, SEQUELA (T88.8XXS) Impression: Partial resolution of abdominal wall/intermesh preperitoneal seroma. Now recurrent. I don't think it'll go away without excising it. It is well encapsulated. It seems stable to me. But it is still a source of discomfort.  I cautioned her that she probably will still have abdominal pain issues. However may be more simple if we can get rid of the seroma with the associated panniculus. Less weight or discomfort in the region. There is a risk of wound breakdown or mesh infection. Hopefully not too likely for mesh infection at this point.  I would table surgery until she discussed with her gynecologist given the worsening fibroids and menorrhagia. See if there is anything they want to do with that since hormonal suppression is not an option given her history of strokes.  I have re-reviewed the the patient's records, history, medications, and allergies.  I have re-examined the patient.  I again discussed intraoperative plans and goals of post-operative recovery.  The patient agrees to proceed.   Adin Hector, M.D., F.A.C.S. Gastrointestinal and Minimally  Invasive Surgery [facility] [address], [place], [place] [address] [phone] Main / Paging

## 2015-05-25 NOTE — Progress Notes (Signed)
Inpatient Diabetes Program Recommendations  AACE/ADA: New Consensus Statement on Inpatient Glycemic Control (2015)  Target Ranges:  Prepandial:   less than 140 mg/dL      [place] postprandial:   less than 180 mg/dL (1-2 hours)      Critically ill patients:  140 - 180 mg/dL   Review of Glycemic Control  Diabetes history: DM2 Outpatient Diabetes medications: Lantus 15 units in am and 10 units QHS, Novolog 5-12 units tidwc, Victoza 1.2 mg QD Current orders for Inpatient glycemic control: Transition from [employer], Lantus 15 now. Lantus 15 in am and 10 QHS, Novolog resistant tidwc and hs + 6 units tidwc  Inpatient Diabetes Program Recommendations:    Agree with orders. Discussed with MD and RN. Pt checks blood sugars several times/day and maintains good control at home.  Will follow. Thank you. Lorenda Peck, RD, LDN, CDE Inpatient Diabetes Coordinator [phone]

## 2015-05-26 DIAGNOSIS — I10 Essential (primary) hypertension: Secondary | ICD-10-CM

## 2015-05-26 DIAGNOSIS — Z794 Long term (current) use of insulin: Secondary | ICD-10-CM | POA: Diagnosis not present

## 2015-05-26 DIAGNOSIS — Z6838 Body mass index (BMI) 38.0-38.9, adult: Secondary | ICD-10-CM | POA: Diagnosis not present

## 2015-05-26 DIAGNOSIS — Z79891 Long term (current) use of opiate analgesic: Secondary | ICD-10-CM | POA: Diagnosis not present

## 2015-05-26 DIAGNOSIS — R1903 Right lower quadrant abdominal swelling, mass and lump: Secondary | ICD-10-CM

## 2015-05-26 DIAGNOSIS — E1140 Type 2 diabetes mellitus with diabetic neuropathy, unspecified: Secondary | ICD-10-CM | POA: Diagnosis not present

## 2015-05-26 DIAGNOSIS — F329 Major depressive disorder, single episode, unspecified: Secondary | ICD-10-CM | POA: Diagnosis not present

## 2015-05-26 DIAGNOSIS — Z79899 Other long term (current) drug therapy: Secondary | ICD-10-CM | POA: Diagnosis not present

## 2015-05-26 DIAGNOSIS — E65 Localized adiposity: Secondary | ICD-10-CM | POA: Diagnosis not present

## 2015-05-26 DIAGNOSIS — F419 Anxiety disorder, unspecified: Secondary | ICD-10-CM | POA: Diagnosis not present

## 2015-05-26 DIAGNOSIS — Z7952 Long term (current) use of systemic steroids: Secondary | ICD-10-CM | POA: Diagnosis not present

## 2015-05-26 DIAGNOSIS — M109 Gout, unspecified: Secondary | ICD-10-CM | POA: Diagnosis not present

## 2015-05-26 DIAGNOSIS — M799 Soft tissue disorder, unspecified: Secondary | ICD-10-CM | POA: Diagnosis not present

## 2015-05-26 DIAGNOSIS — M795 Residual foreign body in soft tissue: Secondary | ICD-10-CM | POA: Diagnosis not present

## 2015-05-26 DIAGNOSIS — E119 Type 2 diabetes mellitus without complications: Secondary | ICD-10-CM

## 2015-05-26 DIAGNOSIS — E1165 Type 2 diabetes mellitus with hyperglycemia: Secondary | ICD-10-CM | POA: Diagnosis not present

## 2015-05-26 DIAGNOSIS — L7634 Postprocedural seroma of skin and subcutaneous tissue following other procedure: Secondary | ICD-10-CM | POA: Diagnosis not present

## 2015-05-26 LAB — GLUCOSE, CAPILLARY
GLUCOSE-CAPILLARY: 121 mg/dL — AB (ref 65–99)
Glucose-Capillary: 186 mg/dL — ABNORMAL HIGH (ref 65–99)
Glucose-Capillary: 315 mg/dL — ABNORMAL HIGH (ref 65–99)

## 2015-05-26 NOTE — Progress Notes (Signed)
Assessment unchanged. Pt and husband verbalized understanding of dc instructions through teach back including JP drain care, follow-up care and when to call the doctor. Portable Prevena Plus pump applied to Prevena wound vac prior to dc. Pt understands to remove wound vac dressing when battery in pump dies within next week. Advised pt to call [phone] number in booklet provided if any technical questions arise with pump and to call [facility] if questions about the removal of dressing. Discharged via wc to front entrance accompanied by NT, husband and children.

## 2015-05-26 NOTE — Discharge Summary (Signed)
Physician Discharge Summary  Patient ID: Tina [name] MRN: [id number] DOB/AGE: 08-16-1970 45 y.o.  Admit date: 05/25/2015 Discharge date: 05/26/2015  Admission Diagnoses:  S/p  ABDOMINAL WALL EXPLORATION  EXCISION OF CHRONIC SEROMA CAVITY  PRIMARY REPAIR OF FASCIAL DISRUPTION Fascial nerve blocks PANNICULECTOMY OF REDUNDANT SKIN  APPLICATION OF incisional skin WOUND VAC  Discharge Diagnoses:  Active Problems:   Diabetes mellitus type II, uncontrolled ([place])   Abdominal wall mass of right lower quadrant   Discharged Condition: good  Hospital Course: Pt was admitted post op to the floor for pain control.  She was ambulating well on her own.  She had good pain control.  She was tolerating PO.  She was deemeed stable for DC and DCd home.  Consults: None  Significant Diagnostic Studies: none  Treatments: surgery: as above  Discharge Exam: Blood pressure 102/50, pulse 79, temperature 98.4 F (36.9 C), temperature source Oral, resp. rate 18, height 5' 6.5" (1.689 m), weight 109.033 kg (240 lb 6 oz), last menstrual period 04/28/2015, SpO2 98 %. General appearance: alert and cooperative GI: soft, approp ttp, Vac in place,   Disposition: 01-Home or Self Care  Discharge Instructions    Call MD for:  extreme fatigue    Complete by:  As directed      Call MD for:  hives    Complete by:  As directed      Call MD for:  persistant nausea and vomiting    Complete by:  As directed      Call MD for:  redness, tenderness, or signs of infection (pain, swelling, redness, odor or green/yellow discharge around incision site)    Complete by:  As directed      Call MD for:  severe uncontrolled pain    Complete by:  As directed      Call MD for:    Complete by:  As directed   Temperature > 101.29F     Diet - low sodium heart healthy    Complete by:  As directed   Start with bland, low residue diet for a few days, then advance to a heart healthy (low fat, high fiber) diet.  If you feel  nauseated or constipated, simplify to a liquid only diet for 48 hours until you are feeling better (no more nausea, farting/passing gas, having a bowel movement, etc...).  If you cannot tolerate even drinking liquids, or feeling worse, let your surgeon know or go to the Emergency Department for help.     Diet - low sodium heart healthy    Complete by:  As directed      Discharge instructions    Complete by:  As directed   Please see discharge instruction sheets.   Also refer to any handouts/printouts that may have been given from the [facility] surgery office (if you visited [name] there before surgery) Please call our office if you have any questions or concerns [phone]     Discharge wound care:    Complete by:  As directed   If you have closed incisions: Shower and bathe over these incisions with soap and water every day.  It is OK to wash over the dressings: they are waterproof. Remove all surgical dressings on postoperative day #3.  You do not need to replace dressings over the closed incisions unless you feel more comfortable with a Band-Aid covering it.   If you have an open wound: That requires packing, so please see wound care instructions.   In  general, remove all dressings, wash wound with soap and water and then replace with saline moistened gauze.  Do the dressing change at least every day.    Please call our office [phone] if you have further questions.     Driving Restrictions    Complete by:  As directed   No driving until off narcotics and can safely swerve away without pain during an emergency     Increase activity slowly    Complete by:  As directed   Walk an hour a day.  Use 20-30 minute walks.  When you can walk 30 minutes without difficulty, it is fine to restart low impact/moderate activities such as biking, jogging, swimming, sexual activity, etc.  Eventually you can increase to unrestricted activity when not feeling pain.  If you feel pain: STOP!Marland Kitchen   Let pain protect  you from overdoing it.  Use ice/heat & over-the-counter pain medications to help minimize soreness.  If that is not enough, then use your narcotic pain prescription as needed to remain active.  It is better to take extra pain medications and be more active than to stay bedridden to avoid all pain medications.     Increase activity slowly    Complete by:  As directed      Lifting restrictions    Complete by:  As directed   Avoid heavy lifting initially, <20 pounds at first.   Do not push through pain.   You have no specific weight limit: If it hurts to do, DON'T DO IT.    If you feel no pain, you are not injuring anything.  Pain will protect you from injury.   Coughing and sneezing are far more stressful to your incision than any lifting.   Avoid resuming heavy lifting (>50 pounds) or other intense activity until off all narcotic pain medications.   When want to exercise more, give yourself 2 weeks to gradually get back to full intense exercise/activity.     May shower / Bathe    Complete by:  As directed   [place].  It is fine for dressings or wounds to be washed/rinsed.  Use gentle soap & water.  This will help the incisions and/or wounds get clean & minimize infection.     May walk up steps    Complete by:  As directed      Sexual Activity Restrictions    Complete by:  As directed   Sexual activity as tolerated.  Do not push through pain.  Pain will protect you from injury.     Walk with assistance    Complete by:  As directed   Walk over an hour a day.  May use a walker/cane/companion to help with balance and stamina.            Medication List    STOP taking these medications        baclofen 10 MG tablet  Commonly known as:  LIORESAL      TAKE these medications        allopurinol 100 MG tablet  Commonly known as:  ZYLOPRIM  Take 100 mg by mouth every evening.     Amlodipine-Valsartan-HCTZ 10-320-25 MG Tabs  Take 1 tablet by mouth every morning.     aspirin  325 MG tablet  Take 325 mg by mouth at bedtime.     colchicine 0.6 MG tablet  Take 0.6 mg by mouth daily as needed (For gout flare-up.).     furosemide 40  MG tablet  Commonly known as:  LASIX  Take 20-40 mg by mouth daily as needed for fluid or edema.     gabapentin 300 MG capsule  Commonly known as:  NEURONTIN  Take 1 capsule (300 mg total) by mouth 3 (three) times daily.     insulin glargine 100 UNIT/ML injection  Commonly known as:  LANTUS  Inject 10-15 Units into the skin 2 (two) times daily. She takes 15 units in the morning and 10 mg in the evening.     NOVOLOG FLEXPEN 100 UNIT/ML FlexPen  Generic drug:  insulin aspart  Inject 5-12 Units into the skin 3 (three) times daily with meals.     ondansetron 4 MG tablet  Commonly known as:  ZOFRAN  Take 1 tablet (4 mg total) by mouth every 6 (six) hours as needed for nausea.     tiZANidine 4 MG tablet  Commonly known as:  ZANAFLEX  Take 4 mg by mouth at bedtime.     traMADol 50 MG tablet  Commonly known as:  ULTRAM  Take 1-2 tablets (50-100 mg total) by mouth every 6 (six) hours as needed for moderate pain or severe pain.     VICTOZA 18 MG/3ML Sopn  Generic drug:  Liraglutide  Inject 1.2 mg into the skin daily.           Follow-up Information    Follow up with GROSS,STEVEN C., MD. Schedule an appointment as soon as possible for a visit in 2 weeks.   Specialty:  General Surgery   Why:  To follow up after your operation, To follow up after your hospital stay   Contact information:   [address] [place] [address] [phone]       Signed: Rosario Jacks., Anne Hahn 05/26/2015, 7:55 AM

## 2015-05-26 NOTE — Progress Notes (Signed)
Dr. Rosendo Gros called unit after page from Case Manager. Clarified questions with MD about possible need for Home Health. Not needed at this time per MD. Md ordered carb mod diet for pt per request.

## 2015-05-26 NOTE — Progress Notes (Signed)
PROGRESS NOTE  Tina [name] [id number] DOB: [birth year] DOA: 05/25/2015 PCP: Dwan Bolt, MD  Brief History:  45 year old female with a history of diabetes mellitus type 2, anxiety, peripheral neuropathy, hypertension was admitted under the surgical service for worsening abdominal pain. The patient has had a fairly complex surgical history which began after repair of an incisional hernia in January 2015. The patient and developed postoperative seroma which had been recurrent despite CT-guided drainage. The patient was previously treated with 6 weeks of doxycycline, but was ultimately lost to follow-up. She represented because of worsening abdominal pain and was noted to have a persistent deep abdominal wall seroma connecting to the preperitoneal space. On 05/25/2015, the patient underwent abdominal wall exploration, excision of seroma cavity, and primary repair of the fascial disruption with panniculectomy. Wound VAC was placed at the time. The patient was stable postoperatively. Internal medicine consultation was obtained for medical management and diabetes management. Postoperatively, the patient was noted to have CBGs in the upper 300s and was started on an insulin drip.  Assessment/Plan: Diabetes mellitus type 2 with neuropathy -05/18/2015 hemoglobin A1c 7.5 -Elevated CBGs partly due to stress of recent surgery as well as missing her doses of Lantus -Restart home dose Lantus 15 units a.m., 10 units p.m. -NovoLog sliding scale -NovoLog 6 units with meals -05/26/2015, the patient refused her NovoLog coverage -CBGs improved with Lantus -Continue Victoza -Follow up with endocrine, Dr. Wilson Singer, after d/c  Essential hypertension -Hold ARB and HCTZ due to soft blood pressure -hold amlodipine for now -follow up with pcp for further adjustments  Diabetic neuropathy -Continue gabapentin  Gouty arthritis -Continue allopurinol   Disposition Plan:   Per primary  service Family Communication:   No Family at beside  Consultants:  [place]  Code Status:  FULL   Subjective: Patient complains of some abdominal pain but it is controlled. Denies any fevers, chills, chest pain, shortness breath, nausea, vomiting, diarrhea patient is tolerating her diet.  Objective: Filed Vitals:   05/25/15 1706 05/25/15 1814 05/26/15 0005 05/26/15 0555  BP: 110/53 145/64 109/61 102/50  Pulse: 96 104 84 79  Temp: 98.1 F (36.7 C) 99 F (37.2 C) 99.5 F (37.5 C) 98.4 F (36.9 C)  TempSrc: Oral Oral Oral Oral  Resp:      Height:      Weight:      SpO2: 97% 100% 98% 98%    Intake/Output Summary (Last 24 hours) at 05/26/15 0950 Last data filed at 05/26/15 0558  Gross per 24 hour  Intake   1728 ml  Output   2590 ml  Net   -862 ml   Weight change:  Exam:   General:  Pt is alert, follows commands appropriately, not in acute distress  HEENT: No icterus, No thrush, No neck mass, [place]/AT  Cardiovascular: RRR, S1/S2, no rubs, no gallops  Respiratory: CTA bilaterally, no wheezing, no crackles, no rhonchi  Abdomen: Soft/+BS, non distended, no guarding  Extremities: 1+ LE edema, No lymphangitis, No petechiae, No rashes, no synovitis   Data Reviewed: I have personally reviewed following labs and imaging studies Basic Metabolic Panel: No results for input(s): NA, K, CL, CO2, GLUCOSE, BUN, CREATININE, CALCIUM, MG, PHOS in the last 168 hours. Liver Function Tests: No results for input(s): AST, ALT, ALKPHOS, BILITOT, PROT, ALBUMIN in the last 168 hours. No results for input(s): LIPASE, AMYLASE in the last 168 hours. No results for input(s): AMMONIA in the last 168 hours.  Coagulation Profile: No results for input(s): INR, PROTIME in the last 168 hours. CBC: No results for input(s): WBC, NEUTROABS, HGB, HCT, MCV, PLT in the last 168 hours. Cardiac Enzymes: No results for input(s): CKTOTAL, CKMB, CKMBINDEX, TROPONINI in the last 168 hours. BNP: Invalid input(s):  POCBNP CBG:  Recent Labs Lab 05/25/15 1703 05/25/15 1805 05/25/15 1930 05/26/15 0012 05/26/15 0809  GLUCAP 380* 428* 387* 315* 121*   HbA1C: No results for input(s): HGBA1C in the last 72 hours. Urine analysis:    Component Value Date/Time   COLORURINE YELLOW 02/02/2015 1915   APPEARANCEUR CLEAR 02/02/2015 1915   LABSPEC 1.016 02/02/2015 1915   PHURINE 5.5 02/02/2015 1915   GLUCOSEU >1000* 02/02/2015 1915   HGBUR MODERATE* 02/02/2015 1915   BILIRUBINUR NEGATIVE 02/02/2015 1915   KETONESUR NEGATIVE 02/02/2015 1915   PROTEINUR NEGATIVE 02/02/2015 1915   UROBILINOGEN 0.2 08/18/2014 1423   NITRITE NEGATIVE 02/02/2015 1915   LEUKOCYTESUR NEGATIVE 02/02/2015 1915   Sepsis Labs: @LABRCNTIP (procalcitonin:4,lacticidven:4) )No results found for this or any previous visit (from the past 240 hour(s)).   Scheduled Meds: . allopurinol  100 mg Oral QPM  . aspirin  325 mg Oral QHS  . enoxaparin (LOVENOX) injection  40 mg Subcutaneous Q24H  . gabapentin  300 mg Oral TID  . insulin aspart  0-20 Units Subcutaneous TID WC  . insulin aspart  0-5 Units Subcutaneous QHS  . insulin aspart  6 Units Subcutaneous TID WC  . insulin glargine  10 Units Subcutaneous QHS  . insulin glargine  15 Units Subcutaneous q morning - 10a  . lip balm  1 application Topical BID  . Liraglutide  1.2 mg Subcutaneous Daily  . polyethylene glycol  17 g Oral BID  . sodium chloride flush  3 mL Intravenous Q12H  . tiZANidine  4 mg Oral QHS  . vitamin C  500 mg Oral BID   Continuous Infusions: . lactated ringers 50 mL/hr at 05/25/15 1626    Procedures/Studies: No results found.  Umaima Scholten, DO  [facility] Pager [phone]  If 7PM-7AM, please contact night-coverage [email or web address] Password [email or web address] 05/26/2015, 9:50 AM

## 2015-05-26 NOTE — Progress Notes (Signed)
Foley catheter removed at 06:17 am, Pt due to void. Will follow up with day shift nurse.

## 2015-05-28 ENCOUNTER — Encounter (HOSPITAL_COMMUNITY): Payer: Self-pay | Admitting: Surgery

## 2015-07-02 ENCOUNTER — Encounter: Payer: Self-pay | Admitting: Registered Nurse

## 2015-07-02 ENCOUNTER — Encounter: Payer: Federal, State, Local not specified - PPO | Attending: Registered Nurse | Admitting: Registered Nurse

## 2015-07-02 VITALS — BP 134/80 | HR 82 | Resp 18

## 2015-07-02 DIAGNOSIS — Z9889 Other specified postprocedural states: Secondary | ICD-10-CM | POA: Diagnosis not present

## 2015-07-02 DIAGNOSIS — M545 Low back pain: Secondary | ICD-10-CM | POA: Insufficient documentation

## 2015-07-02 DIAGNOSIS — R531 Weakness: Secondary | ICD-10-CM | POA: Diagnosis not present

## 2015-07-02 DIAGNOSIS — M7989 Other specified soft tissue disorders: Secondary | ICD-10-CM | POA: Insufficient documentation

## 2015-07-02 DIAGNOSIS — Z8669 Personal history of other diseases of the nervous system and sense organs: Secondary | ICD-10-CM | POA: Insufficient documentation

## 2015-07-02 DIAGNOSIS — R202 Paresthesia of skin: Secondary | ICD-10-CM | POA: Insufficient documentation

## 2015-07-02 DIAGNOSIS — Z833 Family history of diabetes mellitus: Secondary | ICD-10-CM | POA: Insufficient documentation

## 2015-07-02 DIAGNOSIS — R42 Dizziness and giddiness: Secondary | ICD-10-CM | POA: Insufficient documentation

## 2015-07-02 DIAGNOSIS — Z8342 Family history of familial hypercholesterolemia: Secondary | ICD-10-CM | POA: Diagnosis not present

## 2015-07-02 DIAGNOSIS — I10 Essential (primary) hypertension: Secondary | ICD-10-CM | POA: Diagnosis not present

## 2015-07-02 DIAGNOSIS — G2581 Restless legs syndrome: Secondary | ICD-10-CM | POA: Insufficient documentation

## 2015-07-02 DIAGNOSIS — F418 Other specified anxiety disorders: Secondary | ICD-10-CM | POA: Insufficient documentation

## 2015-07-02 DIAGNOSIS — R296 Repeated falls: Secondary | ICD-10-CM | POA: Diagnosis not present

## 2015-07-02 DIAGNOSIS — I69351 Hemiplegia and hemiparesis following cerebral infarction affecting right dominant side: Secondary | ICD-10-CM | POA: Diagnosis not present

## 2015-07-02 DIAGNOSIS — M75101 Unspecified rotator cuff tear or rupture of right shoulder, not specified as traumatic: Secondary | ICD-10-CM | POA: Insufficient documentation

## 2015-07-02 DIAGNOSIS — G8929 Other chronic pain: Secondary | ICD-10-CM | POA: Insufficient documentation

## 2015-07-02 DIAGNOSIS — Z841 Family history of disorders of kidney and ureter: Secondary | ICD-10-CM | POA: Diagnosis not present

## 2015-07-02 DIAGNOSIS — E1140 Type 2 diabetes mellitus with diabetic neuropathy, unspecified: Secondary | ICD-10-CM | POA: Diagnosis not present

## 2015-07-02 DIAGNOSIS — M62838 Other muscle spasm: Secondary | ICD-10-CM | POA: Insufficient documentation

## 2015-07-02 DIAGNOSIS — Z823 Family history of stroke: Secondary | ICD-10-CM | POA: Diagnosis not present

## 2015-07-02 DIAGNOSIS — Z8673 Personal history of transient ischemic attack (TIA), and cerebral infarction without residual deficits: Secondary | ICD-10-CM | POA: Diagnosis not present

## 2015-07-02 DIAGNOSIS — R200 Anesthesia of skin: Secondary | ICD-10-CM | POA: Insufficient documentation

## 2015-07-02 MED ORDER — GABAPENTIN 300 MG PO CAPS: 300.0000 mg | ORAL_CAPSULE | Freq: Three times a day (TID) | ORAL | Status: DC

## 2015-07-02 MED ORDER — TIZANIDINE HCL 4 MG PO TABS: 4.0000 mg | ORAL_TABLET | Freq: Every day | ORAL | Status: DC

## 2015-07-02 MED ORDER — TRAMADOL HCL 50 MG PO TABS: 50.0000 mg | ORAL_TABLET | Freq: Two times a day (BID) | ORAL | Status: DC | PRN

## 2015-07-02 NOTE — Progress Notes (Signed)
Subjective:    Patient ID: Tina [name], female    DOB: 11/06/1970, 45 y.o.   MRN: [id number]  HPI: [name] is a 45 year old female who returns for follow up appointment and medication refill. She states her pain is located in her right shoulder, lower back radiating into her right lower extremity. Also states she's having muscle spasms in her right lower extremity. She rates her pain 7. Her current exercise regime is performing stretching exercises and walking. Also states she has fallen 5 times since her last vist, she states some have been related to her vertigo the other's have been related to her right lower extremity giving out and being imbalance. Also states she hasn't been using her cane. Reviewed falls prevention with her and she has been instructed to use her cane at all times. Referral sent to her neurologist Dr. [name] she had a fall appointment office staff will call to see if they can accommadate her with a earlier appointment. She verbalizes understanding.  Pain Inventory Average Pain 6 Pain Right Now 7 My pain is stabbing and aching  In the last 24 hours, has pain interfered with the following? General activity 7 Relation with others 7 Enjoyment of life 7 What TIME of day is your pain at its worst? evening, night Sleep (in general) Poor  Pain is worse with: standing and some activites Pain improves with: heat/ice, therapy/exercise and pacing activities Relief from Meds: 7  Mobility use a cane ability to climb steps?  yes do you drive?  no Do you have any goals in this area?  no  Function disabled: date disabled 04/11 I need assistance with the following:  bathing, meal prep, household duties and shopping Do you have any goals in this area?  no  Neuro/Psych weakness numbness tingling spasms dizziness depression anxiety  Prior Studies Any changes since last visit?  no  Physicians involved in your care Any changes since last visit?   no Primary care . Neurologist .   Family History  Problem Relation Age of Onset  . Diabetes Father   . Kidney disease Father   . Other Neg Hx   . Diabetes Maternal Grandmother   . Hyperlipidemia Paternal Grandmother   . Stroke Paternal Grandmother    Social History   Social History  . Marital Status: Married    Spouse Name: N/A  . Number of Children: 2  . Years of Education: BA degree   Occupational History  .     Social History Main Topics  . Smoking status: Never Smoker   . Smokeless tobacco: Never Used  . Alcohol Use: No  . Drug Use: No  . Sexual Activity: Yes    Birth Control/ Protection: None   Other Topics Concern  . None   Social History Narrative   Patient is married with 2 children.   Patient is right handed.   Patient has her  BA degree.   Patient drinks 1 cup daily.   Past Surgical History  Procedure Laterality Date  . Cesarean section  2012  . Uterine fibroid surgery      2 SURGERIES FOR FIBROIDS  . Ureter revision      Bilateral "twisted"  . Ventral hernia repair  10/03/2011    Procedure: LAPAROSCOPIC VENTRAL HERNIA;  Surgeon: Adin Hector, MD;  Location: [facility];  Service: General;  Laterality: N/A;  . Hernia repair  10/03/11    ventral hernia repair  . Esophagogastroduodenoscopy N/A  06/03/2012    Procedure: ESOPHAGOGASTRODUODENOSCOPY (EGD);  Surgeon: [name] Craver, MD;  Location: [facility] ENDOSCOPY;  Service: Endoscopy;  Laterality: N/A;  . Diagnostic laparoscopy    . Umbilical hernia repair N/A 02/04/2013    Procedure: LAPAROSCOPIC ventral wall hernia repair LAPAROSCOPIC LYSIS OF ADHESIONS laparoscopic exploration of abdomen ;  Surgeon: Adin Hector, MD;  Location: [facility];  Service: General;  Laterality: N/A;  . Insertion of mesh N/A 02/04/2013    Procedure: INSERTION OF MESH;  Surgeon: Adin Hector, MD;  Location: [facility];  Service: General;  Laterality: N/A;  . Eye surgery      Eye laser for vessel hemorrhaging  . Excision mass abdominal N/A  05/25/2015    Procedure: ABDOMINAL WALL EXPLORATION EXCISION OF SEROMA REMOVAL OF REDUNDANT SKIN ;  Surgeon: Michael Boston, MD;  Location: [facility];  Service: General;  Laterality: N/A;  . Application of wound vac N/A 05/25/2015    Procedure: APPLICATION OF WOUND VAC;  Surgeon: Michael Boston, MD;  Location: [facility];  Service: General;  Laterality: N/A;   Past Medical History  Diagnosis Date  . Spastic hemiplegia affecting dominant side ([place])   . Panic disorder without agoraphobia   . Depression   . Anxiety   . Diabetes mellitus   . Hypertension   . Blood transfusion     IN 2012  AFTER C -SECTION  . Right rotator cuff tear     PAIN IN RIGHT SHOULDER  . Ventral hernia     RIGHT LOWER QUADRANT-CAUSING SOME PAIN  . Anemia     DURING MENSES--HAS HEAVY BLEEDING WITH PERODS  . Headache(784.0)     MIGRAINES--NOT REALLY HEADACHE-MORE LIKE PRESSURE SENSATION IN HEAD-FEELS DIZZIY AND  FAINT AS THE PRESSURE RESOLVES  . Diabetic neuropathy ([place])     BOTH FEET --COMES AND GOES  . Restless leg syndrome     DIAGNOSED BY SLEEP STUDY - PT TOLD SHE DID NOT HAVE SLEEP APNEA  . Rash     HANDS, ARMS --STATES HX OF RASH EVER SINCE CHILDBIRTH/PREGNANCY.  STATES THE RASH OFTEN OCCURS WHEN SHE IS REALLY STRESSED."goes and comes-presently left ring finger"  . Hernia, incisional, RLQ, s/p lap repair Sep 2013 09/02/2011  . SBO (small bowel obstruction) ([place]) 06/03/2012  . Back pain, chronic     "ongoing"  . Leg pain, right     "like bad Charley horse"  . Hx of migraines 10/19/2011  . Cerebral thrombosis with cerebral infarction [facility]) JUNE 2011    RIGHT SIDED WEAKNESS ( ARM AND LEG ) AND SPASMS-remains with slight weakness and vertigo.  . Stroke ([place])   . Endometriosis    BP 134/80 mmHg  Pulse 82  Resp 18  SpO2 95%  LMP 07/02/2015  Opioid Risk Score:   Fall Risk Score:  `1  Depression screen PHQ 2/9  Depression screen PHQ 2/9 03/08/2015  Decreased Interest 2  Down, Depressed, Hopeless 2  PHQ - 2 Score  4  Altered sleeping 3  Tired, decreased energy 3  Change in appetite 1  Feeling bad or failure about yourself  2  Trouble concentrating 3  Moving slowly or fidgety/restless 2  Suicidal thoughts 1  PHQ-9 Score 19  Difficult doing work/chores Somewhat difficult     Review of Systems  Constitutional: Positive for fever, chills and diaphoresis.  Cardiovascular:       Limb swelling   Gastrointestinal: Positive for nausea and abdominal pain.  Endocrine:       High blood sugar  Neurological: Positive for dizziness, weakness and numbness.       Tingling  Spasms    Psychiatric/Behavioral: Positive for dysphoric mood. The patient is nervous/anxious.   All other systems reviewed and are negative.      Objective:   Physical Exam  Constitutional: She is oriented to person, place, and time. She appears well-developed and well-nourished.  HENT:  Head: Normocephalic and atraumatic.  Neck: Normal range of motion. Neck supple.  Cardiovascular: Normal rate and regular rhythm.   Pulmonary/Chest: Effort normal and breath sounds normal.  Musculoskeletal:  Normal Muscle Bulk and Muscle Testing Reveals: Upper Extremities: Full ROM and Muscle Strength 5/5 Right AC Joint Tenderness Lumbar Paraspinal Tenderness: L-3- L-5 Lower Extremities: Full ROM and Muscle Strength 5/5 Arises from table slowly Antalgic Gait  Neurological: She is alert and oriented to person, place, and time.  Skin: Skin is warm and dry.  Psychiatric: She has a normal mood and affect.  Nursing note and vitals reviewed.         Assessment & Plan:   1. Right hemiparesis secondary to CVA, Deficits are stable, chronic problems with balance. Continue stroke prevention: Encouraged to continue to use her  assistive devices.Continue to Monitor 2. Post stroke shoulder pain: continue HEP and to perform stretching exercises for the right shoulder 3. Neuropathy related pain secondary to diabetes: Continue gabapentin 4.Low Back  Pain/ Mainly Right Side: Continue HEP  Refilled Tramadol 50 mg twice a day as needed #60 5.Muscle Spasms: Continue Tizanidine 6. Falls: Refuses PT/ Neurology referral placed  30 minutes of face to face patient care time was spent during this visit. All questions were encouraged and answered.  F/U in 4 months.

## 2015-07-20 ENCOUNTER — Telehealth: Payer: Self-pay | Admitting: Neurology

## 2015-07-20 NOTE — Telephone Encounter (Signed)
Pt called in asking for an earlier appt . Pain management physician is asking for pt to have earlier appt due to pt being off balance  [phone]

## 2015-07-20 NOTE — Telephone Encounter (Signed)
Rn call patient back about needing a sooner appt. Pt was last seen by Megan(NP) in 09/2015 for stroke follow up. Pt stated  Eunice THomas(NP) at pain clinic saw her in June 2017 for pain issues. Pt stated she has had vertigo since his stroke. Rn stated per Zella Ball Thomas(NP) note she refuse therapy. Pt stated she did not really refuse. Patient stated has home exercises for dizzy spells that she does at home. Pt states she sees her PCP in August 2017 for a follow up. Rn stated a message will be sent to Megan(NP) if she can be seen by her. Rn stated Dr.Sethi has no appts till mid August 2017.Pt verbalized understanding.

## 2015-07-23 NOTE — Telephone Encounter (Signed)
Rn call patient about making a sooner appt. Pt is schedule for 0930 for sympt of balance issues. Rn stated to check in 0900 no later than 0915, and to bring insurance card and co payment. Rn still kept the 09/2015 with Dr. [name]. Pt verbalized understanding. Pt wanted a Friday appt because someone can keep her kids. Rn stated Megan(NP) does not work on Fridays. Rn stated the kids are welcome to come to her appt. Pt understood.

## 2015-07-23 NOTE — Telephone Encounter (Signed)
I can see the patient if I have a sooner appointment

## 2015-07-23 NOTE — Telephone Encounter (Signed)
Unable to get a connection on her home number. The number did not even ring. Rn was trying to contact patient that the NP can see her sooner for an appt.

## 2015-07-23 NOTE — Telephone Encounter (Signed)
Rn tried to call patient back on her cell phone number. Someone answer the phone, and I repeated her name twice and than they hung up. Rn was trying to contact patient about being seen sooner for her being off balance.

## 2015-07-24 ENCOUNTER — Ambulatory Visit: Payer: Self-pay | Admitting: Adult Health

## 2015-07-24 ENCOUNTER — Telehealth: Payer: Self-pay

## 2015-07-24 NOTE — Telephone Encounter (Signed)
Patient did not show to appt today  

## 2015-07-31 ENCOUNTER — Encounter: Payer: Self-pay | Admitting: Adult Health

## 2015-07-31 ENCOUNTER — Ambulatory Visit (INDEPENDENT_AMBULATORY_CARE_PROVIDER_SITE_OTHER): Payer: Federal, State, Local not specified - PPO | Admitting: Adult Health

## 2015-07-31 ENCOUNTER — Telehealth: Payer: Self-pay | Admitting: Adult Health

## 2015-07-31 VITALS — BP 124/86 | HR 80 | Ht 66.5 in | Wt 232.2 lb

## 2015-07-31 DIAGNOSIS — R0683 Snoring: Secondary | ICD-10-CM

## 2015-07-31 DIAGNOSIS — Z8673 Personal history of transient ischemic attack (TIA), and cerebral infarction without residual deficits: Secondary | ICD-10-CM

## 2015-07-31 DIAGNOSIS — R269 Unspecified abnormalities of gait and mobility: Secondary | ICD-10-CM

## 2015-07-31 DIAGNOSIS — G4719 Other hypersomnia: Secondary | ICD-10-CM | POA: Diagnosis not present

## 2015-07-31 DIAGNOSIS — W19XXXA Unspecified fall, initial encounter: Secondary | ICD-10-CM

## 2015-07-31 MED ORDER — TIZANIDINE HCL 2 MG PO TABS: ORAL_TABLET | ORAL | Status: DC

## 2015-07-31 NOTE — Progress Notes (Signed)
I agree with the above plan 

## 2015-07-31 NOTE — Telephone Encounter (Signed)
Waneta Martins [facility] [phone] called to ask, "who signing Doctor is for NP, Megan".

## 2015-07-31 NOTE — Progress Notes (Signed)
PATIENT: Kamaryn PIPPER UNDERHILL DOB: [birth year]  REASON FOR VISIT: follow up HISTORY FROM: patient  HISTORY OF PRESENT ILLNESS: Miss Makley is a 45 year old female with a history of stroke and tension-type headaches. She returns today for follow-up. She states that she continues to have headaches. She describes it as if someone "blew air into her head." She states that occasionally this is followed by sharp pain behind the left eye. She states that sometimes the headaches can occur in the morning or the evening. She reports she is noticed that the right side gets weak at times. She states that she has noticed that after every single surgery. The patient has had a total of 3 surgeries due to hernias. She reports that it usually gets better. When she is weaker on the right she is able to function but describes it as if the right side "feels heavy." She also reports that she is having more trouble with her balance. She states since January she's had a total of 4 falls. She feels as if her legs give out. The patient remains on aspirin. Blood pressure is well controlled. She continues to work on her hemoglobin A1c. Her current level was 7.8. Denies any falls with the cholesterol. She is monitoring her diet and trying to exercise. She states that none of the symptoms are necessarily "new" but are slightly worse. She reports that she had a sleep study "years ago." She does report snoring and excessive daytime sleepiness. She returns today for an evaluation.   HISTORY 10/10/14: Ms. Haberland is a 45 year old female with a history of stroke and tension-type headaches. She returns today for follow-up. The patient continues to take aspirin for stroke prevention. Denies any significant bruising or bleeding. The patient has maintained good control of her blood pressure. The patient does state that her hemoglobin A1c was elevated at 8.7. She states that this was contributed to a flareup of gout which caused her  significant discomfort. She states that she has another appointment next month for repeat lab work. The patient denies any strokelike symptoms. She continues to have headaches. However she does not want to try anything daily. She states that her headache tends to present differently. Occasionally she'll have left-sided pain with a sharp stabbing pain behind the eye. At other times she'll have pain in the back of the head in the neck. She also states that sometimes the pain is in the frontal region. When she does have a headache she uses the tramadol. If the headache is severe she can take tizanidine and her headache resolves. She denies any new medical history is she returns today for an evaluation  HISTORY 04/12/14; Miss Carrithers is a 45 year old female with a history of stroke and tension-type headaches. She returns today for follow-up. She continues taking aspirin for stroke prevention. She states her blood pressure has been well controlled. She states she recently had blood work with her primary care. Unsure what her cholesterol was. She states her hemoglobin A1c has recently been elevated but this is due to some complications with a hernia repair. He states that she's had several surgeries for hernia complications. She recently developed a fluid pocket as well as an infection and had repair on antibiotics. She states her blood sugars are finally starting to get back to normal. The patient states that she continues to have tension type headaches. She will have approximately 3 a week. She states that the pressure sensation feels like a balloon blown up in her  head and then releasing. She states that this can last for several minutes and then resolves. She states that she will usually have 1-2 headaches that involve sharp pain behind the left eye. She states this will call some photophobia and she'll have to delay in a dark room to get rid of the headache. He denies any strokelike symptoms. Although she does  state when she had her surgery she did develop right-sided weakness after both surgeries and had to participate in physical therapy. She returns today for an evaluation.  REVIEW OF SYSTEMS: Out of a complete 14 system review of symptoms, the patient complains only of the following symptoms, and all other reviewed systems are negative.  Abdominal pain, diarrhea, nausea, vomiting, restless leg, insomnia, shortness of breath, eye pain, light sensitivity, eye discharge, fatigue, facial swelling, ear pain, ringing in ears, drooling, insomnia, restless leg, dizziness, headache, numbness, weakness, anemia, food allergies  ALLERGIES: Allergies  Allergen Reactions  . Contrast Media [Iodinated Diagnostic Agents] Other (See Comments)    Difficulty breathing  . Iohexol Hives, Nausea And Vomiting and Swelling     Desc: Magnevist-gadolinium-difficulty breathing, throat swelling   . Midazolam Hcl Anaphylaxis    Difficulty breathing  . Shellfish Allergy Anaphylaxis  . Metformin And Related Diarrhea and Nausea And Vomiting  . Other Itching    Patient is allergic to all nuts except peanuts.   . Avandia [Rosiglitazone Maleate] Hives and Other (See Comments)  . Geodon [Ziprasidone] Other (See Comments)    UNKNOWN  . Kiwi Extract Itching and Swelling  . Latex Itching    HOME MEDICATIONS: Outpatient Prescriptions Prior to Visit  Medication Sig Dispense Refill  . allopurinol (ZYLOPRIM) 100 MG tablet Take 100 mg by mouth every evening.    . Amlodipine-Valsartan-HCTZ 10-320-25 MG TABS Take 1 tablet by mouth every morning.     Marland Kitchen aspirin 325 MG tablet Take 325 mg by mouth at bedtime.     . colchicine 0.6 MG tablet Take 0.6 mg by mouth daily as needed (For gout flare-up.).   1  . furosemide (LASIX) 40 MG tablet Take 20-40 mg by mouth daily as needed for fluid or edema.     . gabapentin (NEURONTIN) 300 MG capsule Take 1 capsule (300 mg total) by mouth 3 (three) times daily. 270 capsule 1  . Liraglutide  (VICTOZA) 18 MG/3ML SOPN Inject 1.2 mg into the skin daily.     Marland Kitchen NOVOLOG FLEXPEN 100 UNIT/ML FlexPen Inject 5-12 Units into the skin 3 (three) times daily with meals.   99  . ondansetron (ZOFRAN) 4 MG tablet Take 1 tablet (4 mg total) by mouth every 6 (six) hours as needed for nausea. 20 tablet 0  . tiZANidine (ZANAFLEX) 4 MG tablet Take 1 tablet (4 mg total) by mouth at bedtime. 30 tablet 4  . traMADol (ULTRAM) 50 MG tablet Take 1 tablet (50 mg total) by mouth 2 (two) times daily as needed for moderate pain or severe pain. 60 tablet 5  . insulin glargine (LANTUS) 100 UNIT/ML injection Inject 10-15 Units into the skin 2 (two) times daily. She takes 15 units in the morning and 10 mg in the evening.     No facility-administered medications prior to visit.    PAST MEDICAL HISTORY: Past Medical History  Diagnosis Date  . Spastic hemiplegia affecting dominant side ([place])   . Panic disorder without agoraphobia   . Depression   . Anxiety   . Diabetes mellitus   . Hypertension   .  Blood transfusion     IN 2012  AFTER C -SECTION  . Right rotator cuff tear     PAIN IN RIGHT SHOULDER  . Ventral hernia     RIGHT LOWER QUADRANT-CAUSING SOME PAIN  . Anemia     DURING MENSES--HAS HEAVY BLEEDING WITH PERODS  . Headache(784.0)     MIGRAINES--NOT REALLY HEADACHE-MORE LIKE PRESSURE SENSATION IN HEAD-FEELS DIZZIY AND  FAINT AS THE PRESSURE RESOLVES  . Diabetic neuropathy ([place])     BOTH FEET --COMES AND GOES  . Restless leg syndrome     DIAGNOSED BY SLEEP STUDY - PT TOLD SHE DID NOT HAVE SLEEP APNEA  . Rash     HANDS, ARMS --STATES HX OF RASH EVER SINCE CHILDBIRTH/PREGNANCY.  STATES THE RASH OFTEN OCCURS WHEN SHE IS REALLY STRESSED."goes and comes-presently left ring finger"  . Hernia, incisional, RLQ, s/p lap repair Sep 2013 09/02/2011  . SBO (small bowel obstruction) ([place]) 06/03/2012  . Back pain, chronic     "ongoing"  . Leg pain, right     "like bad Charley horse"  . Hx of migraines 10/19/2011    . Cerebral thrombosis with cerebral infarction [facility]) JUNE 2011    RIGHT SIDED WEAKNESS ( ARM AND LEG ) AND SPASMS-remains with slight weakness and vertigo.  . Stroke ([place])   . Endometriosis     PAST SURGICAL HISTORY: Past Surgical History  Procedure Laterality Date  . Cesarean section  2012  . Uterine fibroid surgery      2 SURGERIES FOR FIBROIDS  . Ureter revision      Bilateral "twisted"  . Ventral hernia repair  10/03/2011    Procedure: LAPAROSCOPIC VENTRAL HERNIA;  Surgeon: Adin Hector, MD;  Location: [facility];  Service: General;  Laterality: N/A;  . Hernia repair  10/03/11    ventral hernia repair  . Esophagogastroduodenoscopy N/A 06/03/2012    Procedure: ESOPHAGOGASTRODUODENOSCOPY (EGD);  Surgeon: [name] Craver, MD;  Location: [facility] ENDOSCOPY;  Service: Endoscopy;  Laterality: N/A;  . Diagnostic laparoscopy    . Umbilical hernia repair N/A 02/04/2013    Procedure: LAPAROSCOPIC ventral wall hernia repair LAPAROSCOPIC LYSIS OF ADHESIONS laparoscopic exploration of abdomen ;  Surgeon: Adin Hector, MD;  Location: [facility];  Service: General;  Laterality: N/A;  . Insertion of mesh N/A 02/04/2013    Procedure: INSERTION OF MESH;  Surgeon: Adin Hector, MD;  Location: [facility];  Service: General;  Laterality: N/A;  . Eye surgery      Eye laser for vessel hemorrhaging  . Excision mass abdominal N/A 05/25/2015    Procedure: ABDOMINAL WALL EXPLORATION EXCISION OF SEROMA REMOVAL OF REDUNDANT SKIN ;  Surgeon: Michael Boston, MD;  Location: [facility];  Service: General;  Laterality: N/A;  . Application of wound vac N/A 05/25/2015    Procedure: APPLICATION OF WOUND VAC;  Surgeon: Michael Boston, MD;  Location: [facility];  Service: General;  Laterality: N/A;    FAMILY HISTORY: Family History  Problem Relation Age of Onset  . Diabetes Father   . Kidney disease Father   . Other Neg Hx   . Diabetes Maternal Grandmother   . Hyperlipidemia Paternal Grandmother   . Stroke Paternal Grandmother      SOCIAL HISTORY: Social History   Social History  . Marital Status: Married    Spouse Name: N/A  . Number of Children: 2  . Years of Education: BA degree   Occupational History  .     Social History Main Topics  .  Smoking status: Never Smoker   . Smokeless tobacco: Never Used  . Alcohol Use: No  . Drug Use: No  . Sexual Activity: Yes    Birth Control/ Protection: None   Other Topics Concern  . Not on file   Social History Narrative   Patient is married with 2 children.   Patient is right handed.   Patient has her  BA degree.   Patient drinks 1 cup daily.      PHYSICAL EXAM  Filed Vitals:   07/31/15 0931  BP: 124/86  Pulse: 80  Height: 5' 6.5" (1.689 m)  Weight: 232 lb 3.2 oz (105.325 kg)   Body mass index is 36.92 kg/(m^2).  Generalized: Well developed, in no acute distress  Neck: Circumference 16 3/4 inches, Mallampati 1+  Neurological examination  Mentation: Alert oriented to time, place, history taking. Follows all commands speech and language fluent Cranial nerve II-XII: Pupils were equal round reactive to light. Extraocular movements were full, visual field were full on confrontational test. Facial sensation and strength were normal. Uvula tongue midline. Head turning and shoulder shrug  were normal and symmetric. Motor: The motor testing reveals 5 over 5 strength of all 4 extremities. Good symmetric motor tone is noted throughout.  Sensory: Sensory testing is intact to soft touch on all 4 extremities. No evidence of extinction is noted.  Coordination: Cerebellar testing reveals good finger-nose-finger and heel-to-shin bilaterally.  Gait and station: Gait is normal. Tandem gait is unsteady. Romberg is negative. No drift is seen.  Reflexes: Deep tendon reflexes are symmetric and normal bilaterally.   DIAGNOSTIC DATA (LABS, IMAGING, TESTING) - I reviewed patient records, labs, notes, testing and imaging myself where available.  Lab Results  Component  Value Date   WBC 6.1 05/18/2015   HGB 11.8* 05/18/2015   HCT 35.8* 05/18/2015   MCV 78.9 05/18/2015   PLT 296 05/18/2015      Component Value Date/Time   NA 142 05/18/2015 1105   K 4.1 05/18/2015 1105   CL 105 05/18/2015 1105   CO2 27 05/18/2015 1105   GLUCOSE 131* 05/18/2015 1105   BUN 16 05/18/2015 1105   CREATININE 0.92 05/18/2015 1105   CALCIUM 9.4 05/18/2015 1105   PROT 6.9 02/02/2015 1936   ALBUMIN 3.4* 02/02/2015 1936   AST 17 02/02/2015 1936   ALT 16 02/02/2015 1936   ALKPHOS 46 02/02/2015 1936   BILITOT 0.3 02/02/2015 1936   GFRNONAA >60 05/18/2015 1105   GFRAA >60 05/18/2015 1105    Lab Results  Component Value Date   HGBA1C 7.5* 05/18/2015   Lab Results  Component Value Date   VITAMINB12 289 06/16/2009   Lab Results  Component Value Date   TSH 1.045 06/03/2012      ASSESSMENT AND PLAN 45 y.o. year old female  has a past medical history of Spastic hemiplegia affecting dominant side ([place]); Panic disorder without agoraphobia; Depression; Anxiety; Diabetes mellitus; Hypertension; Blood transfusion; Right rotator cuff tear; Ventral hernia; Anemia; Headache(784.0); Diabetic neuropathy ([place]); Restless leg syndrome; Rash; Hernia, incisional, RLQ, s/p lap repair Sep 2013 (09/02/2011); SBO (small bowel obstruction) ([place]) (06/03/2012); Back pain, chronic; Leg pain, right; migraines (10/19/2011); Cerebral thrombosis with cerebral infarction [facility]) (JUNE 2011); Stroke [facility]); and Endometriosis. here with:  1. History of stroke 2. Tension type headaches 3. frequent falls 4 Abnormality of gait 5. Snoring 6. Daytime sleepiness  The patient's physical exam is relatively unremarkable with the exception of unsteadiness with tandem gait. I will refer  for physical  therapy. The patient was also noticed an increase in her headaches. She is currently on tizanidine. She does not want to try any new medication. Advised that she could take half a tablet (1 mg) in the morning and 1  tablet (2 mg) in the evening. Advised that she should refrain from taking the morning tablet if she will be driving as it can cause drowsiness. Patient also notes snoring and daytime sleepiness. I will refer the patient for sleep evaluation. The weakness she is experiencing on the right side could be a residual effect from the stroke. The patient is encouraged to continue aspirin. Maintain strict control of blood pressure with goal less than 130/90. Cholesterol LDL less than 70 and hemoglobin A1c less than 6.5%. Patient advised that if she experiences any strokelike symptoms she should go to the emergency room. She will follow-up in 3 months with Dr. [name].   [name], MSN, NP-C 07/31/2015, 9:41 AM [facility] [address] [place], [place] [address] [phone]

## 2015-07-31 NOTE — Patient Instructions (Addendum)
Referral to physical therapy referral for sleep study Continue Aspirin Monitor BP with goal <130/90 Cholesterol goal <70 hgbA1c <6.5 % If you develop any stroke like symptoms please go to the emergency room. Tizandine: take 1 tablet (2 mg) at bedtime. Can take 1/2 tablet (1 mg) in the morning unless you will be driving. If your symptoms worsen or you develop new symptoms please let us know.

## 2015-08-01 NOTE — Telephone Encounter (Signed)
Returned call to Baxter Flattery and let her know that primary MD would be Dr. [name]. Verbalized understanding and appreciation for call.

## 2015-08-01 NOTE — Telephone Encounter (Signed)
Dr. [name] is her primary neurologist so he can sign HH orders.

## 2015-08-03 ENCOUNTER — Telehealth: Payer: Self-pay | Admitting: Neurology

## 2015-08-03 NOTE — Telephone Encounter (Signed)
Amy/ [facility] Physical Therapist called and says pt declined evaluation for today and would like verbal order to move it to next week. Please call [phone] ask for Caryl Pina she can take verbal order.

## 2015-08-03 NOTE — Telephone Encounter (Signed)
RN call Caryl Pina back to give verbal order. Rn stated MeganNP) saw patient but the verbal order can be given to start therapy next week.

## 2015-08-07 DIAGNOSIS — T888 Other specified complications of surgical and medical care, not elsewhere classified: Secondary | ICD-10-CM | POA: Diagnosis not present

## 2015-08-07 DIAGNOSIS — F329 Major depressive disorder, single episode, unspecified: Secondary | ICD-10-CM | POA: Diagnosis not present

## 2015-08-07 DIAGNOSIS — Z7982 Long term (current) use of aspirin: Secondary | ICD-10-CM | POA: Diagnosis not present

## 2015-08-07 DIAGNOSIS — E1140 Type 2 diabetes mellitus with diabetic neuropathy, unspecified: Secondary | ICD-10-CM | POA: Diagnosis not present

## 2015-08-07 DIAGNOSIS — Z9181 History of falling: Secondary | ICD-10-CM | POA: Diagnosis not present

## 2015-08-07 DIAGNOSIS — I69351 Hemiplegia and hemiparesis following cerebral infarction affecting right dominant side: Secondary | ICD-10-CM | POA: Diagnosis not present

## 2015-08-07 DIAGNOSIS — D649 Anemia, unspecified: Secondary | ICD-10-CM | POA: Diagnosis not present

## 2015-08-07 DIAGNOSIS — F419 Anxiety disorder, unspecified: Secondary | ICD-10-CM | POA: Diagnosis not present

## 2015-08-07 DIAGNOSIS — I10 Essential (primary) hypertension: Secondary | ICD-10-CM | POA: Diagnosis not present

## 2015-08-07 DIAGNOSIS — Z794 Long term (current) use of insulin: Secondary | ICD-10-CM | POA: Diagnosis not present

## 2015-08-14 DIAGNOSIS — E1140 Type 2 diabetes mellitus with diabetic neuropathy, unspecified: Secondary | ICD-10-CM | POA: Diagnosis not present

## 2015-08-14 DIAGNOSIS — T888 Other specified complications of surgical and medical care, not elsewhere classified: Secondary | ICD-10-CM | POA: Diagnosis not present

## 2015-08-14 DIAGNOSIS — I10 Essential (primary) hypertension: Secondary | ICD-10-CM | POA: Diagnosis not present

## 2015-08-14 DIAGNOSIS — Z9181 History of falling: Secondary | ICD-10-CM | POA: Diagnosis not present

## 2015-08-14 DIAGNOSIS — Z7982 Long term (current) use of aspirin: Secondary | ICD-10-CM | POA: Diagnosis not present

## 2015-08-14 DIAGNOSIS — Z794 Long term (current) use of insulin: Secondary | ICD-10-CM | POA: Diagnosis not present

## 2015-08-14 DIAGNOSIS — I69351 Hemiplegia and hemiparesis following cerebral infarction affecting right dominant side: Secondary | ICD-10-CM | POA: Diagnosis not present

## 2015-08-14 DIAGNOSIS — F329 Major depressive disorder, single episode, unspecified: Secondary | ICD-10-CM | POA: Diagnosis not present

## 2015-08-14 DIAGNOSIS — F419 Anxiety disorder, unspecified: Secondary | ICD-10-CM | POA: Diagnosis not present

## 2015-08-14 DIAGNOSIS — D649 Anemia, unspecified: Secondary | ICD-10-CM | POA: Diagnosis not present

## 2015-08-16 ENCOUNTER — Ambulatory Visit (INDEPENDENT_AMBULATORY_CARE_PROVIDER_SITE_OTHER): Payer: Federal, State, Local not specified - PPO | Admitting: Neurology

## 2015-08-16 ENCOUNTER — Encounter: Payer: Self-pay | Admitting: Neurology

## 2015-08-16 VITALS — BP 116/75 | HR 87 | Ht 66.5 in | Wt 234.0 lb

## 2015-08-16 DIAGNOSIS — R519 Headache, unspecified: Secondary | ICD-10-CM

## 2015-08-16 DIAGNOSIS — R51 Headache: Secondary | ICD-10-CM | POA: Diagnosis not present

## 2015-08-16 DIAGNOSIS — I63139 Cerebral infarction due to embolism of unspecified carotid artery: Secondary | ICD-10-CM

## 2015-08-16 DIAGNOSIS — G4710 Hypersomnia, unspecified: Secondary | ICD-10-CM | POA: Diagnosis not present

## 2015-08-16 DIAGNOSIS — G4730 Sleep apnea, unspecified: Secondary | ICD-10-CM | POA: Diagnosis not present

## 2015-08-16 NOTE — Patient Instructions (Signed)
Sleep Studies A sleep study (polysomnogram) is a series of tests done while you are sleeping. It can show how well you sleep. This can help your health care provider diagnose a sleep disorder and show how severe your sleep disorder is. A sleep study may lead to treatment that will help you sleep better and prevent other medical problems caused by poor sleep. If you have a sleep disorder, you may also be at risk for:   Sleep-related accidents.  High blood pressure.  Heart disease.  Stroke.  Other medical conditions. Sleep disorders are common. Your health care provider may suspect a sleep disorder if you:  Have loud snoring most nights.  Have brief periods when you stop breathing at night.  Feel sleepy on most days.  Fall asleep suddenly during the day.  Have trouble falling asleep or staying asleep.  Feel like you need to move your legs when trying to fall asleep.  Have dreams that seem very real shortly after falling asleep.  Feel like you cannot move when you first wake up. WHICH TESTS WILL I NEED TO HAVE?  Most sleep studies last all night and include these tests:  Recordings of your brain activity.  Recordings of your eye movements.  Recording of your heart rate and rhythm.  Blood pressure readings.  Readings of the amount of oxygen in your blood.  Measurements of your chest and belly movement as you breathe during sleep. If you have signs of the sleep disorder called sleep apnea during your test, you may get a mask to wear for the second half of the night.   The mask provides continuous positive airway pressure (CPAP). This may improve sleep apnea significantly.  You will then have all tests done again with the mask in place to see if your measurements and recordings change. HOW ARE SLEEP STUDIES DONE? Most sleep studies are done over one full night of sleep.   You will arrive at the study center in the evening and can go home in the morning.  Bring your  pajamas and toothbrush.  Do not have caffeine on the day of your sleep study.  Your health care provider will let you know if you need to stop taking any of your regular medicines before the test. To do the tests included in a polysomnogram, you will have:  Round, sticky patches with sensors attached to recording wires (electrodes) placed on your scalp, face, chest, and limbs.  Wires from all the electrodes and sensors run from your bed to a computer. The wires can be taken off and put back on if you need to get out of bed to go to the bathroom.  A sensor placed over your nose to measure airflow.  A finger clip put on one finger to measure your blood oxygen level.  A belt around your belly and a belt around your chest to measure breathing movements. WHERE ARE SLEEP STUDIES DONE?  Sleep studies are done at sleep centers. A sleep center may be inside a hospital, office, or clinic.  The room where you have the study may look like a hospital room or a hotel room. The health care providers doing the study may come in and out of the room during the study. Most of the time, they will be in another room monitoring your test.  HOW IS INFORMATION FROM SLEEP STUDIES HELPFUL? A polysomnogram can be used along with your medical history and a physical exam to diagnose conditions, such as:  Sleep   apnea.  Restless legs syndrome.  Sleep-related seizure disorders.  Sleep-related movement disorders. A medical doctor who specializes in sleep will evaluate your sleep study. The specialist will share the results with your primary health care provider. Treatments based on your sleep study may include:  Improving your sleep habits (sleep hygiene).  Wearing a CPAP mask.  Wearing an oral device at night to improve breathing and reduce snoring.  Taking medicine for:  Restless legs syndrome.  Sleep-related seizure disorder.  Sleep-related movement disorder.   This information is not intended to  replace advice given to you by your health care provider. Make sure you discuss any questions you have with your health care provider.   Document Released: 07/06/2002 Document Revised: 01/20/2014 Document Reviewed: 03/07/2013 Elsevier Interactive Patient Education 2016 [employer]  

## 2015-08-16 NOTE — Progress Notes (Signed)
SLEEP MEDICINE CLINIC   Provider:  Larey [name], M D  Referring Provider: Anda Kraft, MD Primary Care Physician:  Tina Bolt, MD  Chief Complaint  Patient presents with  . Other    rm 11,  Sleep Consult, New Pt, "I toss and turn, wake several times, do not snore but make noises when I stop breathing"    HPI:  Tina [name] is a 45 y.o. [name] , seen here as a referral from Dr [name] and [name] NP, To be reevaluated for excessive daytime fatigue, and a very high degree of daytime sleepiness.  I had the pleasure of seeing Mrs. Tina [name] in referral for sleep study in October 11th, 2011 at the time she was 45 years old and had suffered from hypertension, diabetes, migraine, stroke and neuropathy. She also was clinically depressed at the time following a stroke. The patient did not have sleep apnea her AHI at the time was 0.5 and snoring was rather mild. Her main sleep interruption at the time was from periodic limb movements, kicking and twitching with arousals of 16 per hour of sleep.  Tina [name] husband used to be a loud snorer until he went for a UPPP surgery.Today, she reports a chief complaint of tossing and turning waking up frequently at night but again her husband had confirmed that she does not snore she does make some noises when she stops breathing apnea may not be present. Further she endorsed fatigue, sometimes chest pain, swelling in her legs, ringing in her ears diarrhea crams, respiratory allergies and skin sensitivity, anemia dizziness headaches, numbness, confusion, sleepiness and insomnia.  FSS at 62  And ESS endorsed at 12/24.  Sleep habits are as follows:  Tina [name] usually takes tizanidine at night if it helps her to fall asleep. Her bedtime is around 10 PM and 1 AM, but most often around midnight. Her bedroom is cool, she runs a fan for background noise, and is dark. She shares a bedroom with her husband, she usually sleeps on her right  side. She sleeps on 2 solid pillows, the head of bed cannot be raised. She usually wakes up at about 5 AM, sometimes her young child will wake her up. She reports vivid dreams, frequently. Most of the dreams are pleasant -they do not have a Theatre stage manager. However that will be 4-5 hours of solid sleep before her sleep becomes fragmented. She goes to the bathroom but it was not the urge to urinate that would wake her. She reports that her sleep has no longer sound after her morning arousal and often she seems to just dose of or think about plans for the day , as she is unable to go back to sleep. She reaches on average 5 hours of sleep time at night. She gets her boys ( twins of 7 and a son of 5 ) ready for school but by 8:30 or 9:00 she may go back to the couch and doze off for an hour or so. Lately she has felt as if somebody gave her a sleeping pill and she always doses of at about between 12 and 1. Th these are sleep attacks she is not in bed or on a couch ready to go to sleep but full dose of 4 seconds by being in conversation, running errands or watching her children. It made her concerned enough not to drive at that time, as she had micro-sleep attacks at noon. She drives with the car window open, pinching  herself, chewing gum TO NOT FALL ASLEEP. She reports getting dizzy when emotionally challenged- getting dizzy, lightheaded, vertigo.    Sleep medical history and family sleep history: She's not aware of any family member carrying a diagnosis of sleep apnea or using CPAP, her youngest child a 31-year-old often changes beds at night and will wake her up between 3 and 5 AM.   Social history: married, mother of 3,  Caffeine use ; none, alcohol; none, no soda but ocassionally Green tea. Tobacco use in any form.   Review of Systems: Out of a complete 14 system review, the patient complains of only the following symptoms, and all other reviewed systems are negative.  Restless, non  restorative sleep- sleep attacks in afternoon.   Epworth score 12 , Fatigue severity score 63  , depression score 3/15    Social History   Social History  . Marital status: Married    Spouse name: N/A  . Number of children: 2  . Years of education: BA degree   Occupational History  .  Unemployed   Social History Main Topics  . Smoking status: Never Smoker  . Smokeless tobacco: Never Used  . Alcohol use No  . Drug use: No  . Sexual activity: Yes    Birth control/ protection: None   Other Topics Concern  . Not on file   Social History Narrative   Patient is married with 2 children.   Patient is right handed.   Patient has her  BA degree.   Patient drinks 1 cup daily.    Family History  Problem Relation Age of Onset  . Diabetes Father   . Kidney disease Father   . Diabetes Maternal Grandmother   . Hyperlipidemia Paternal Grandmother   . Stroke Paternal Grandmother   . Other Neg Hx     Past Medical History:  Diagnosis Date  . Anemia    DURING MENSES--HAS HEAVY BLEEDING WITH PERODS  . Anxiety   . Back pain, chronic    "ongoing"  . Blood transfusion    IN 2012  AFTER C -SECTION  . Cerebral thrombosis with cerebral infarction [facility]) JUNE 2011   RIGHT SIDED WEAKNESS ( ARM AND LEG ) AND SPASMS-remains with slight weakness and vertigo.  . Depression   . Diabetes mellitus   . Diabetic neuropathy ([place])    BOTH FEET --COMES AND GOES  . Endometriosis   . Headache(784.0)    MIGRAINES--NOT REALLY HEADACHE-MORE LIKE PRESSURE SENSATION IN HEAD-FEELS DIZZIY AND  FAINT AS THE PRESSURE RESOLVES  . Hernia, incisional, RLQ, s/p lap repair Sep 2013 09/02/2011  . Hx of migraines 10/19/2011  . Hypertension   . Leg pain, right    "like bad Charley horse"  . Panic disorder without agoraphobia   . Rash    HANDS, ARMS --STATES HX OF RASH EVER SINCE CHILDBIRTH/PREGNANCY.  STATES THE RASH OFTEN OCCURS WHEN SHE IS REALLY STRESSED."goes and comes-presently left ring finger"  .  Restless leg syndrome    DIAGNOSED BY SLEEP STUDY - PT TOLD SHE DID NOT HAVE SLEEP APNEA  . Right rotator cuff tear    PAIN IN RIGHT SHOULDER  . SBO (small bowel obstruction) ([place]) 06/03/2012  . Spastic hemiplegia affecting dominant side ([place])   . Stroke ([place])   . Ventral hernia    RIGHT LOWER QUADRANT-CAUSING SOME PAIN    Past Surgical History:  Procedure Laterality Date  . APPLICATION OF WOUND VAC N/A 05/25/2015   Procedure: APPLICATION OF WOUND VAC;  Surgeon: Michael Boston, MD;  Location: [facility];  Service: General;  Laterality: N/A;  . CESAREAN SECTION  2012  . DIAGNOSTIC LAPAROSCOPY    . ESOPHAGOGASTRODUODENOSCOPY N/A 06/03/2012   Procedure: ESOPHAGOGASTRODUODENOSCOPY (EGD);  Surgeon: [name] Craver, MD;  Location: [facility] ENDOSCOPY;  Service: Endoscopy;  Laterality: N/A;  . EXCISION MASS ABDOMINAL N/A 05/25/2015   Procedure: ABDOMINAL WALL EXPLORATION EXCISION OF SEROMA REMOVAL OF REDUNDANT SKIN ;  Surgeon: Michael Boston, MD;  Location: [facility];  Service: General;  Laterality: N/A;  . EYE SURGERY     Eye laser for vessel hemorrhaging  . HERNIA REPAIR  10/03/11   ventral hernia repair  . INSERTION OF MESH N/A 02/04/2013   Procedure: INSERTION OF MESH;  Surgeon: Adin Hector, MD;  Location: [facility];  Service: General;  Laterality: N/A;  . UMBILICAL HERNIA REPAIR N/A 02/04/2013   Procedure: LAPAROSCOPIC ventral wall hernia repair LAPAROSCOPIC LYSIS OF ADHESIONS laparoscopic exploration of abdomen ;  Surgeon: Adin Hector, MD;  Location: [facility];  Service: General;  Laterality: N/A;  . URETER REVISION     Bilateral "twisted"  . UTERINE FIBROID SURGERY     2 SURGERIES FOR FIBROIDS  . VENTRAL HERNIA REPAIR  10/03/2011   Procedure: LAPAROSCOPIC VENTRAL HERNIA;  Surgeon: Adin Hector, MD;  Location: [facility];  Service: General;  Laterality: N/A;    Current Outpatient Prescriptions  Medication Sig Dispense Refill  . allopurinol (ZYLOPRIM) 100 MG tablet Take 100 mg by mouth every evening.    .  Amlodipine-Valsartan-HCTZ 10-320-25 MG TABS Take 1 tablet by mouth every morning.     Marland Kitchen aspirin 325 MG tablet Take 325 mg by mouth at bedtime.     . colchicine 0.6 MG tablet Take 0.6 mg by mouth daily as needed (For gout flare-up.).   1  . furosemide (LASIX) 40 MG tablet Take 20-40 mg by mouth daily as needed for fluid or edema.     . gabapentin (NEURONTIN) 300 MG capsule Take 1 capsule (300 mg total) by mouth 3 (three) times daily. 270 capsule 1  . gabapentin (NEURONTIN) 600 MG tablet TAKE 5 TABLETS BY MOUTH DAILY AS DIRECTED  99  . Insulin Glargine (BASAGLAR KWIKPEN) 100 UNIT/ML SOPN INJECT 25 UNITS TWICE A DAY  99  . lidocaine (XYLOCAINE) 5 % ointment Reported on 07/31/2015    . Liraglutide (VICTOZA) 18 MG/3ML SOPN Inject 1.2 mg into the skin daily.     Marland Kitchen NOVOLOG FLEXPEN 100 UNIT/ML FlexPen Inject 5-12 Units into the skin 3 (three) times daily with meals.   99  . ondansetron (ZOFRAN) 4 MG tablet Take 1 tablet (4 mg total) by mouth every 6 (six) hours as needed for nausea. 20 tablet 0  . ONE TOUCH ULTRA TEST test strip     . tiZANidine (ZANAFLEX) 2 MG tablet Take 1 tablet at bedtime daily. Can take 1/2 tablet in the AM PRN. 45 tablet 3  . traMADol (ULTRAM) 50 MG tablet Take 1 tablet (50 mg total) by mouth 2 (two) times daily as needed for moderate pain or severe pain. 60 tablet 5   No current facility-administered medications for this visit.     Allergies as of 08/16/2015 - Review Complete 08/16/2015  Allergen Reaction Noted  . Contrast media [iodinated diagnostic agents] Other (See Comments) 02/03/2011  . Iohexol Hives, Nausea And Vomiting, and Swelling 04/07/2004  . Midazolam hcl Anaphylaxis 02/03/2011  . Shellfish allergy Anaphylaxis 02/03/2011  . Metformin and related Diarrhea and Nausea And Vomiting  02/03/2011  . Other Itching 08/01/2011  . Avandia [rosiglitazone maleate] Hives and Other (See Comments) 02/03/2011  . Geodon [ziprasidone] Other (See Comments) 12/16/2011  . Kiwi  extract Itching and Swelling 02/03/2011  . Latex Itching 02/02/2014    Vitals: BP 116/75   Pulse 87   Ht 5' 6.5" (1.689 m)   Wt 234 lb (106.1 kg)   BMI 37.20 kg/m  Last Weight:  Wt Readings from Last 1 Encounters:  08/16/15 234 lb (106.1 kg)   [id number] mass index is 37.2 kg/m.     Last Height:   Ht Readings from Last 1 Encounters:  08/16/15 5' 6.5" (1.689 m)    Physical exam:  General: The patient is awake, alert and appears not in acute distress. The patient is well groomed. Head: Normocephalic, atraumatic. Neck is supple. Mallampati 3-4 , narrow and peaked palate  neck circumference: 16.5 . Nasal airflow patent , TMJ tenderness on the right is  evident . Retrognathia is seen.  Cardiovascular:  Regular rate and rhythm, without  murmurs or carotid bruit, and without distended neck veins. Respiratory: Lungs are clear to auscultation. Skin:  Without evidence of edema, or rash Trunk: BMI is elevated . The patient's posture is erect.   Neurologic exam : The patient is awake and alert, oriented to place and time.   Memory subjective described as intact.  Memory testing revealed Attention span & concentration ability appears normal.  Speech is fluent,  without dysarthria, dysphonia or aphasia.  Mood and affect are appropriate.  Cranial nerves: Pupils are equal and briskly reactive to light. Funduscopic exam without evidence of pallor or edema. Extraocular movements  in vertical and horizontal planes intact and without nystagmus. Visual fields by finger perimetry are intact. Hearing to finger rub intact.   Facial sensation intact to fine touch.  Facial motor strength  noticeable very mild lower facial droop. Left-sided but  tongue and uvula move midline. Shoulder shrug was symmetrical.   Motor exam:   Normal tone, muscle bulk. Grip strength is slightly weaker on the right than on the left and the patient is right-hand dominant.  Sensory:  Fine touch, pinprick and vibration were  normal.  Coordination: Finger-to-nose maneuver  normal without evidence of ataxia, dysmetria or tremor.  Gait and station: Patient walks without assistive device and is able unassisted to climb up to the exam table. Strength within normal limits.  Stance is stable and normal.  Deep tendon reflexes: in the  upper and lower extremities are symmetric and intact.   Dr [name] and Roney Marion last note , 07-31-2015  1. History of stroke 2. Tension type headaches 3. frequent falls 4 Abnormality of gait 5. Snoring 6. Daytime sleepiness  The patient's physical exam is relatively unremarkable with the exception of unsteadiness with tandem gait. I will refer  for physical therapy. The patient was also noticed an increase in her headaches. She is currently on tizanidine. She does not want to try any new medication. Advised that she could take half a tablet (1 mg) in the morning and 1 tablet (2 mg) in the evening. Advised that she should refrain from taking the morning tablet if she will be driving as it can cause drowsiness. Patient also notes snoring and daytime sleepiness. I will refer the patient for sleep evaluation. The weakness she is experiencing on the right side could be a residual effect from the stroke. The patient is encouraged to continue aspirin. Maintain strict control of blood pressure with goal less  than 130/90. Cholesterol LDL less than 70 and hemoglobin A1c less than 6.5%. Patient advised that if she experiences any strokelike symptoms she should go to the emergency room. She will follow-up in 3 months with Dr. [name].    The patient was advised of the nature of the diagnosed sleep disorder , the treatment options and risks for general a health and wellness arising from not treating the condition. I would consider Tina [name] excessively daytime sleepy but certainly excessively fatigued. The my course sleep attacks that she seems to have at midday are also concerning is able affect  her ability and safety operating machinery or driving. She does not report narcolepsy symptoms such as sleep paralysis dream intrusion stream hallucinations, but she is also on medications that may suppress REM sleep. As I mentioned above, she takes tizanidine at bedtime. I spent more than 50 minutes of face to face time with the patient. Greater than 50% of time was spent in counseling and coordination of care. We have discussed the diagnosis and differential and I answered the patient's questions.     Assessment:  After physical and neurologic examination, review of laboratory studies,  Personal review of imaging studies, reports of other /same  Imaging studies ,  Results of polysomnography/ neurophysiology testing and pre-existing records as far as provided in visit., my assessment is   1) Tina [name] suffered a stroke at a very young age, and her adopted twins were just with her for about 3 months. And shortly after their adoption was finalized she became pregnant was her only biological child, and no 53-year-old son who was born prematurely. She has morning and sleep related headaches, making it necessary to check capnography.  Excessive daytime sleepiness is sometimes seen after right hemispheric brain strokes but Tina [name] stroke was in the left. She does have a high-grade Mallampati, neck circumference and an elevated body mass index for this reason we will first test her for obstructive sleep apnea as the most common sleep disorder. She suffers from severe spasms in her right lower extremity and these may be manifesting as periodic limb movements. I will also check for a ferritin and total iron-binding capacity level. Iron deficiency anemia is a precursor to restless legs.  Plan:  Treatment plan and additional workup :  SPLIT night polysomnography. Capnography. PLM recording.  RV after sleep study      Tina Seat MD  08/16/2015   CC: Antony Contras, MD    Tina Kraft,  [place] [address] [place], [place] [address]

## 2015-08-17 DIAGNOSIS — E1140 Type 2 diabetes mellitus with diabetic neuropathy, unspecified: Secondary | ICD-10-CM | POA: Diagnosis not present

## 2015-08-17 DIAGNOSIS — Z7982 Long term (current) use of aspirin: Secondary | ICD-10-CM | POA: Diagnosis not present

## 2015-08-17 DIAGNOSIS — F329 Major depressive disorder, single episode, unspecified: Secondary | ICD-10-CM | POA: Diagnosis not present

## 2015-08-17 DIAGNOSIS — E118 Type 2 diabetes mellitus with unspecified complications: Secondary | ICD-10-CM | POA: Diagnosis not present

## 2015-08-17 DIAGNOSIS — I69351 Hemiplegia and hemiparesis following cerebral infarction affecting right dominant side: Secondary | ICD-10-CM | POA: Diagnosis not present

## 2015-08-17 DIAGNOSIS — I10 Essential (primary) hypertension: Secondary | ICD-10-CM | POA: Diagnosis not present

## 2015-08-17 DIAGNOSIS — Z9181 History of falling: Secondary | ICD-10-CM | POA: Diagnosis not present

## 2015-08-17 DIAGNOSIS — F419 Anxiety disorder, unspecified: Secondary | ICD-10-CM | POA: Diagnosis not present

## 2015-08-17 DIAGNOSIS — Z794 Long term (current) use of insulin: Secondary | ICD-10-CM | POA: Diagnosis not present

## 2015-08-17 DIAGNOSIS — D649 Anemia, unspecified: Secondary | ICD-10-CM | POA: Diagnosis not present

## 2015-08-21 DIAGNOSIS — D649 Anemia, unspecified: Secondary | ICD-10-CM | POA: Diagnosis not present

## 2015-08-21 DIAGNOSIS — I69351 Hemiplegia and hemiparesis following cerebral infarction affecting right dominant side: Secondary | ICD-10-CM | POA: Diagnosis not present

## 2015-08-21 DIAGNOSIS — Z9181 History of falling: Secondary | ICD-10-CM | POA: Diagnosis not present

## 2015-08-21 DIAGNOSIS — Z794 Long term (current) use of insulin: Secondary | ICD-10-CM | POA: Diagnosis not present

## 2015-08-21 DIAGNOSIS — Z7982 Long term (current) use of aspirin: Secondary | ICD-10-CM | POA: Diagnosis not present

## 2015-08-21 DIAGNOSIS — E1140 Type 2 diabetes mellitus with diabetic neuropathy, unspecified: Secondary | ICD-10-CM | POA: Diagnosis not present

## 2015-08-21 DIAGNOSIS — F329 Major depressive disorder, single episode, unspecified: Secondary | ICD-10-CM | POA: Diagnosis not present

## 2015-08-21 DIAGNOSIS — I10 Essential (primary) hypertension: Secondary | ICD-10-CM | POA: Diagnosis not present

## 2015-08-21 DIAGNOSIS — F419 Anxiety disorder, unspecified: Secondary | ICD-10-CM | POA: Diagnosis not present

## 2015-08-23 DIAGNOSIS — D649 Anemia, unspecified: Secondary | ICD-10-CM | POA: Diagnosis not present

## 2015-08-23 DIAGNOSIS — Z9181 History of falling: Secondary | ICD-10-CM | POA: Diagnosis not present

## 2015-08-23 DIAGNOSIS — Z794 Long term (current) use of insulin: Secondary | ICD-10-CM | POA: Diagnosis not present

## 2015-08-23 DIAGNOSIS — I10 Essential (primary) hypertension: Secondary | ICD-10-CM | POA: Diagnosis not present

## 2015-08-23 DIAGNOSIS — Z7982 Long term (current) use of aspirin: Secondary | ICD-10-CM | POA: Diagnosis not present

## 2015-08-23 DIAGNOSIS — F419 Anxiety disorder, unspecified: Secondary | ICD-10-CM | POA: Diagnosis not present

## 2015-08-23 DIAGNOSIS — I69351 Hemiplegia and hemiparesis following cerebral infarction affecting right dominant side: Secondary | ICD-10-CM | POA: Diagnosis not present

## 2015-08-23 DIAGNOSIS — F329 Major depressive disorder, single episode, unspecified: Secondary | ICD-10-CM | POA: Diagnosis not present

## 2015-08-23 DIAGNOSIS — E1140 Type 2 diabetes mellitus with diabetic neuropathy, unspecified: Secondary | ICD-10-CM | POA: Diagnosis not present

## 2015-08-27 DIAGNOSIS — Z7982 Long term (current) use of aspirin: Secondary | ICD-10-CM | POA: Diagnosis not present

## 2015-08-27 DIAGNOSIS — I10 Essential (primary) hypertension: Secondary | ICD-10-CM | POA: Diagnosis not present

## 2015-08-27 DIAGNOSIS — E1140 Type 2 diabetes mellitus with diabetic neuropathy, unspecified: Secondary | ICD-10-CM | POA: Diagnosis not present

## 2015-08-27 DIAGNOSIS — Z9181 History of falling: Secondary | ICD-10-CM | POA: Diagnosis not present

## 2015-08-27 DIAGNOSIS — D649 Anemia, unspecified: Secondary | ICD-10-CM | POA: Diagnosis not present

## 2015-08-27 DIAGNOSIS — F329 Major depressive disorder, single episode, unspecified: Secondary | ICD-10-CM | POA: Diagnosis not present

## 2015-08-27 DIAGNOSIS — F419 Anxiety disorder, unspecified: Secondary | ICD-10-CM | POA: Diagnosis not present

## 2015-08-27 DIAGNOSIS — Z794 Long term (current) use of insulin: Secondary | ICD-10-CM | POA: Diagnosis not present

## 2015-08-27 DIAGNOSIS — I69351 Hemiplegia and hemiparesis following cerebral infarction affecting right dominant side: Secondary | ICD-10-CM | POA: Diagnosis not present

## 2015-08-28 DIAGNOSIS — I10 Essential (primary) hypertension: Secondary | ICD-10-CM | POA: Diagnosis not present

## 2015-08-28 DIAGNOSIS — E118 Type 2 diabetes mellitus with unspecified complications: Secondary | ICD-10-CM | POA: Diagnosis not present

## 2015-08-30 DIAGNOSIS — I69351 Hemiplegia and hemiparesis following cerebral infarction affecting right dominant side: Secondary | ICD-10-CM | POA: Diagnosis not present

## 2015-08-30 DIAGNOSIS — D649 Anemia, unspecified: Secondary | ICD-10-CM | POA: Diagnosis not present

## 2015-08-30 DIAGNOSIS — F329 Major depressive disorder, single episode, unspecified: Secondary | ICD-10-CM | POA: Diagnosis not present

## 2015-08-30 DIAGNOSIS — Z794 Long term (current) use of insulin: Secondary | ICD-10-CM | POA: Diagnosis not present

## 2015-08-30 DIAGNOSIS — E1140 Type 2 diabetes mellitus with diabetic neuropathy, unspecified: Secondary | ICD-10-CM | POA: Diagnosis not present

## 2015-08-30 DIAGNOSIS — Z9181 History of falling: Secondary | ICD-10-CM | POA: Diagnosis not present

## 2015-08-30 DIAGNOSIS — I10 Essential (primary) hypertension: Secondary | ICD-10-CM | POA: Diagnosis not present

## 2015-08-30 DIAGNOSIS — F419 Anxiety disorder, unspecified: Secondary | ICD-10-CM | POA: Diagnosis not present

## 2015-08-30 DIAGNOSIS — Z7982 Long term (current) use of aspirin: Secondary | ICD-10-CM | POA: Diagnosis not present

## 2015-09-04 DIAGNOSIS — D649 Anemia, unspecified: Secondary | ICD-10-CM | POA: Diagnosis not present

## 2015-09-04 DIAGNOSIS — Z7982 Long term (current) use of aspirin: Secondary | ICD-10-CM | POA: Diagnosis not present

## 2015-09-04 DIAGNOSIS — F419 Anxiety disorder, unspecified: Secondary | ICD-10-CM | POA: Diagnosis not present

## 2015-09-04 DIAGNOSIS — Z9181 History of falling: Secondary | ICD-10-CM | POA: Diagnosis not present

## 2015-09-04 DIAGNOSIS — I69351 Hemiplegia and hemiparesis following cerebral infarction affecting right dominant side: Secondary | ICD-10-CM | POA: Diagnosis not present

## 2015-09-04 DIAGNOSIS — E1140 Type 2 diabetes mellitus with diabetic neuropathy, unspecified: Secondary | ICD-10-CM | POA: Diagnosis not present

## 2015-09-04 DIAGNOSIS — Z794 Long term (current) use of insulin: Secondary | ICD-10-CM | POA: Diagnosis not present

## 2015-09-04 DIAGNOSIS — F329 Major depressive disorder, single episode, unspecified: Secondary | ICD-10-CM | POA: Diagnosis not present

## 2015-09-04 DIAGNOSIS — I10 Essential (primary) hypertension: Secondary | ICD-10-CM | POA: Diagnosis not present

## 2015-09-07 DIAGNOSIS — Z7982 Long term (current) use of aspirin: Secondary | ICD-10-CM | POA: Diagnosis not present

## 2015-09-07 DIAGNOSIS — F419 Anxiety disorder, unspecified: Secondary | ICD-10-CM | POA: Diagnosis not present

## 2015-09-07 DIAGNOSIS — D649 Anemia, unspecified: Secondary | ICD-10-CM | POA: Diagnosis not present

## 2015-09-07 DIAGNOSIS — I10 Essential (primary) hypertension: Secondary | ICD-10-CM | POA: Diagnosis not present

## 2015-09-07 DIAGNOSIS — F329 Major depressive disorder, single episode, unspecified: Secondary | ICD-10-CM | POA: Diagnosis not present

## 2015-09-07 DIAGNOSIS — Z9181 History of falling: Secondary | ICD-10-CM | POA: Diagnosis not present

## 2015-09-07 DIAGNOSIS — E1140 Type 2 diabetes mellitus with diabetic neuropathy, unspecified: Secondary | ICD-10-CM | POA: Diagnosis not present

## 2015-09-07 DIAGNOSIS — Z794 Long term (current) use of insulin: Secondary | ICD-10-CM | POA: Diagnosis not present

## 2015-09-07 DIAGNOSIS — I69351 Hemiplegia and hemiparesis following cerebral infarction affecting right dominant side: Secondary | ICD-10-CM | POA: Diagnosis not present

## 2015-09-14 DIAGNOSIS — Z1231 Encounter for screening mammogram for malignant neoplasm of breast: Secondary | ICD-10-CM | POA: Diagnosis not present

## 2015-09-14 DIAGNOSIS — Z1151 Encounter for screening for human papillomavirus (HPV): Secondary | ICD-10-CM | POA: Diagnosis not present

## 2015-09-14 DIAGNOSIS — Z01411 Encounter for gynecological examination (general) (routine) with abnormal findings: Secondary | ICD-10-CM | POA: Diagnosis not present

## 2015-09-14 DIAGNOSIS — Z01419 Encounter for gynecological examination (general) (routine) without abnormal findings: Secondary | ICD-10-CM | POA: Diagnosis not present

## 2015-09-14 DIAGNOSIS — R300 Dysuria: Secondary | ICD-10-CM | POA: Diagnosis not present

## 2015-09-24 DIAGNOSIS — H4311 Vitreous hemorrhage, right eye: Secondary | ICD-10-CM | POA: Diagnosis not present

## 2015-09-24 DIAGNOSIS — E113512 Type 2 diabetes mellitus with proliferative diabetic retinopathy with macular edema, left eye: Secondary | ICD-10-CM | POA: Diagnosis not present

## 2015-09-24 DIAGNOSIS — E113552 Type 2 diabetes mellitus with stable proliferative diabetic retinopathy, left eye: Secondary | ICD-10-CM | POA: Diagnosis not present

## 2015-09-24 DIAGNOSIS — E113551 Type 2 diabetes mellitus with stable proliferative diabetic retinopathy, right eye: Secondary | ICD-10-CM | POA: Diagnosis not present

## 2015-09-25 DIAGNOSIS — S301XXA Contusion of abdominal wall, initial encounter: Secondary | ICD-10-CM | POA: Diagnosis not present

## 2015-09-25 DIAGNOSIS — Z9889 Other specified postprocedural states: Secondary | ICD-10-CM | POA: Diagnosis not present

## 2015-09-25 DIAGNOSIS — Z8719 Personal history of other diseases of the digestive system: Secondary | ICD-10-CM | POA: Diagnosis not present

## 2015-10-08 DIAGNOSIS — E118 Type 2 diabetes mellitus with unspecified complications: Secondary | ICD-10-CM | POA: Diagnosis not present

## 2015-10-09 DIAGNOSIS — M65312 Trigger thumb, left thumb: Secondary | ICD-10-CM | POA: Diagnosis not present

## 2015-10-10 ENCOUNTER — Ambulatory Visit: Payer: Federal, State, Local not specified - PPO | Admitting: Neurology

## 2015-10-12 ENCOUNTER — Ambulatory Visit (INDEPENDENT_AMBULATORY_CARE_PROVIDER_SITE_OTHER): Payer: Federal, State, Local not specified - PPO | Admitting: Neurology

## 2015-10-12 DIAGNOSIS — R51 Headache: Secondary | ICD-10-CM

## 2015-10-12 DIAGNOSIS — G4730 Sleep apnea, unspecified: Secondary | ICD-10-CM | POA: Diagnosis not present

## 2015-10-12 DIAGNOSIS — G4733 Obstructive sleep apnea (adult) (pediatric): Secondary | ICD-10-CM

## 2015-10-12 DIAGNOSIS — I63139 Cerebral infarction due to embolism of unspecified carotid artery: Secondary | ICD-10-CM

## 2015-10-12 DIAGNOSIS — G4710 Hypersomnia, unspecified: Secondary | ICD-10-CM

## 2015-10-12 DIAGNOSIS — R519 Headache, unspecified: Secondary | ICD-10-CM

## 2015-10-12 DIAGNOSIS — G4761 Periodic limb movement disorder: Secondary | ICD-10-CM

## 2015-10-17 ENCOUNTER — Telehealth: Payer: Self-pay

## 2015-10-17 DIAGNOSIS — G4761 Periodic limb movement disorder: Secondary | ICD-10-CM | POA: Insufficient documentation

## 2015-10-17 DIAGNOSIS — G4733 Obstructive sleep apnea (adult) (pediatric): Secondary | ICD-10-CM | POA: Insufficient documentation

## 2015-10-17 NOTE — Telephone Encounter (Signed)
I spoke to pt. I advised her that her sleep study results did not reveal sleep apnea, hypoxemia, or arrhythmias. However, pt had frequent PLMS. I advised pt to lose weight, diet and exercise if not contraindicated by her other physicians. I advised pt to avoid caffeine containing beverages and chocolate. I offered pt a follow up appt to discuss treatment for her PLMS which pt accepted, and a follow up appt was made for 11/01/15 at 9:30am. Pt verbalized understanding of results. Pt had no questions at this time but was encouraged to call back if questions arise.

## 2015-11-01 ENCOUNTER — Encounter: Payer: Self-pay | Admitting: Neurology

## 2015-11-01 ENCOUNTER — Ambulatory Visit (INDEPENDENT_AMBULATORY_CARE_PROVIDER_SITE_OTHER): Payer: Federal, State, Local not specified - PPO | Admitting: Neurology

## 2015-11-01 VITALS — BP 130/70 | HR 68 | Resp 20 | Ht 66.0 in | Wt 233.0 lb

## 2015-11-01 DIAGNOSIS — I63139 Cerebral infarction due to embolism of unspecified carotid artery: Secondary | ICD-10-CM

## 2015-11-01 DIAGNOSIS — N946 Dysmenorrhea, unspecified: Secondary | ICD-10-CM | POA: Diagnosis not present

## 2015-11-01 DIAGNOSIS — G4761 Periodic limb movement disorder: Secondary | ICD-10-CM

## 2015-11-01 DIAGNOSIS — I69359 Hemiplegia and hemiparesis following cerebral infarction affecting unspecified side: Secondary | ICD-10-CM | POA: Diagnosis not present

## 2015-11-01 DIAGNOSIS — G2581 Restless legs syndrome: Secondary | ICD-10-CM | POA: Diagnosis not present

## 2015-11-01 DIAGNOSIS — E0841 Diabetes mellitus due to underlying condition with diabetic mononeuropathy: Secondary | ICD-10-CM

## 2015-11-01 NOTE — Patient Instructions (Signed)
Restless Legs Syndrome Restless legs syndrome is a condition that causes uncomfortable feelings or sensations in the legs, especially while sitting or lying down. The sensations usually cause an overwhelming urge to move the legs. The arms can also sometimes be affected. The condition can range from mild to severe. The symptoms often interfere with a person's ability to sleep. CAUSES The cause of this condition is not known. RISK FACTORS This condition is more likely to develop in:  People who are older than age 50.  Pregnant women. In general, restless legs syndrome is more common in women than in men.  People who have a family history of the condition.  People who have certain medical conditions, such as iron deficiency, kidney disease, Parkinson disease, or nerve damage.  People who take certain medicines, such as medicines for high blood pressure, nausea, colds, allergies, depression, and some heart conditions. SYMPTOMS The main symptom of this condition is uncomfortable sensations in the legs. These sensations may be:  Described as pulling, tingling, prickling, throbbing, crawling, or burning.  Worse while you are sitting or lying down.  Worse during periods of rest or inactivity.  Worse at night, often interfering with your sleep.  Accompanied by a very strong urge to move your legs.  Temporarily relieved by movement of your legs. The sensations usually affect both sides of the body. The arms can also be affected, but this is rare. People who have this condition often have tiredness during the day because of their lack of sleep at night. DIAGNOSIS This condition may be diagnosed based on your description of the symptoms. You may also have tests, including blood tests, to check for other conditions that may lead to your symptoms. In some cases, you may be asked to spend some time in a sleep lab so your sleeping can be monitored. TREATMENT Treatment for this condition is  focused on managing the symptoms. Treatment may include:  Self-help and lifestyle changes.  Medicines. HOME CARE INSTRUCTIONS  Take medicines only as directed by your health care provider.  Try these methods to get temporary relief from the uncomfortable sensations:  Massage your legs.  Walk or stretch.  Take a cold or hot bath.  Practice good sleep habits. For example, go to bed and get up at the same time every day.  Exercise regularly.  Practice ways of relaxing, such as yoga or meditation.  Avoid caffeine and alcohol.  Do not use any tobacco products, including cigarettes, chewing tobacco, or electronic cigarettes. If you need help quitting, ask your health care provider.  Keep all follow-up visits as directed by your health care provider. This is important. SEEK MEDICAL CARE IF: Your symptoms do not improve with treatment, or they get worse.   This information is not intended to replace advice given to you by your health care provider. Make sure you discuss any questions you have with your health care provider.   Document Released: 12/20/2001 Document Revised: 05/16/2014 Document Reviewed: 12/26/2013 Elsevier Interactive Patient Education 2016 [employer]  

## 2015-11-01 NOTE — Progress Notes (Signed)
SLEEP MEDICINE CLINIC   Provider:  Larey Seat, M D  Referring Provider: Anda Kraft, MD Primary Care Physician:  Tina Bolt, MD  Chief Complaint  Patient presents with  . Follow-up    discuss sleep study results    HPI:  Tina [name] is a 45 y.o. female , seen here as a referral from Dr Tina [name] and Tina Givens NP, To be reevaluated for excessive daytime fatigue, and a very high degree of daytime sleepiness.  I had the pleasure of seeing Tina [name] in referral for sleep study in October 11th, 2011 at the time she was 45 years old and had suffered from hypertension, diabetes, migraine, stroke and neuropathy. She also was clinically depressed at the time following a stroke. The patient did not have sleep apnea her AHI at the time was 0.5 and snoring was rather mild. Her main sleep interruption at the time was from periodic limb movements, kicking and twitching with arousals of 16 per hour of sleep.  Tina [name] husband used to be a loud snorer until he went for a UPPP surgery.Today, she reports a chief complaint of tossing and turning waking up frequently at night but again her husband had confirmed that she does not snore she does make some noises when she stops breathing apnea may not be present. Further she endorsed fatigue, sometimes chest pain, swelling in her legs, ringing in her ears diarrhea crams, respiratory allergies and skin sensitivity, anemia dizziness headaches, numbness, confusion, sleepiness and insomnia.  FSS at 62  And ESS endorsed at 12/24.  Sleep habits are as follows:  Tina [name] usually takes tizanidine at night if it helps her to fall asleep. Her bedtime is around 10 PM and 1 AM, but most often around midnight. Her bedroom is cool, she runs a fan for background noise, and is dark. She shares a bedroom with her husband, she usually sleeps on her right side. She sleeps on 2 solid pillows, the head of bed cannot be raised. She usually wakes  up at about 5 AM, sometimes her young child will wake her up. She reports vivid dreams, frequently. Most of the dreams are pleasant -they do not have a Theatre stage manager. However that will be 4-5 hours of solid sleep before her sleep becomes fragmented. She goes to the bathroom but it was not the urge to urinate that would wake her. She reports that her sleep has no longer sound after her morning arousal and often she seems to just dose of or think about plans for the day , as she is unable to go back to sleep. She reaches on average 5 hours of sleep time at night. She gets her boys ( twins of 7 and a son of 5 ) ready for school but by 8:30 or 9:00 she may go back to the couch and doze off for an hour or so. Lately she has felt as if somebody gave her a sleeping pill and she always doses of at about between 12 and 1. Th these are sleep attacks she is not in bed or on a couch ready to go to sleep but full dose of 4 seconds by being in conversation, running errands or watching her children. It made her concerned enough not to drive at that time, as she had micro-sleep attacks at noon. She drives with the car window open, pinching herself, chewing gum TO NOT FALL ASLEEP. She reports getting dizzy when emotionally challenged- getting dizzy, lightheaded, vertigo.  Sleep medical history and family sleep history: She's not aware of any family member carrying a diagnosis of sleep apnea or using CPAP, her youngest child a 23-year-old often changes beds at night and will wake her up between 3 and 5 AM. Social history: married, mother of 3,  Caffeine use ; none, alcohol; none, no soda but ocassionally Green tea. Tobacco use in any form.  I have the pleasure on 11/01/2015 to see Tina [name]  Who complained about excessive daytime sleepiness. She also has periodic limb movements kicking and twitching and a concern for restless legs was addressed.  Her baseline polysomnogram from 10/12/2015 documented no  significant apnea, her AHI was 3.0 she did not have central apneas, no oxygen desaturations of prolonged duration, no cardiac arrhythmia frequent periodic limb movements. She also had symptoms of hypoglycemia which wake her from sleep. He did not follow this baseline polysomnogram with an MS LT-organic reason for sleep interruption was identified, and her case restless legs and PLM disorder. Today she endorsed the [facility] restless leg syndrome questionnaire at a moderate to severe impairment the same is true for the RLS 6 rating scales. On a scale of 1-10 she endorsed consistency between 5 and 8 points. It bothers her constantly that she has the irresistible urge to move   Review of Systems: Out of a complete 14 system review, the patient complains of only the following symptoms, and all other reviewed systems are negative.  Restless, non restorative sleep- sleep attacks in afternoon.   Epworth score 12 , Fatigue severity score 63  , depression score 3/15    Social History   Social History  . Marital status: Married    Spouse name: N/A  . Number of children: 2  . Years of education: BA degree   Occupational History  .  Unemployed   Social History Main Topics  . Smoking status: Never Smoker  . Smokeless tobacco: Never Used  . Alcohol use No  . Drug use: No  . Sexual activity: Yes    Birth control/ protection: None   Other Topics Concern  . Not on file   Social History Narrative   Patient is married with 2 children.   Patient is right handed.   Patient has her  BA degree.   Patient drinks 1 cup daily.    Family History  Problem Relation Age of Onset  . Diabetes Father   . Kidney disease Father   . Diabetes Maternal Grandmother   . Hyperlipidemia Paternal Grandmother   . Stroke Paternal Grandmother   . Other Neg Hx     Past Medical History:  Diagnosis Date  . Anemia    DURING MENSES--HAS HEAVY BLEEDING WITH PERODS  . Anxiety   . Back pain, chronic     "ongoing"  . Blood transfusion    IN 2012  AFTER C -SECTION  . Cerebral thrombosis with cerebral infarction [facility]) JUNE 2011   RIGHT SIDED WEAKNESS ( ARM AND LEG ) AND SPASMS-remains with slight weakness and vertigo.  . Depression   . Diabetes mellitus   . Diabetic neuropathy ([place])    BOTH FEET --COMES AND GOES  . Endometriosis   . Headache(784.0)    MIGRAINES--NOT REALLY HEADACHE-MORE LIKE PRESSURE SENSATION IN HEAD-FEELS DIZZIY AND  FAINT AS THE PRESSURE RESOLVES  . Hernia, incisional, RLQ, s/p lap repair Sep 2013 09/02/2011  . Hx of migraines 10/19/2011  . Hypertension   . Leg pain, right    "like bad Evlyn Clines  horse"  . Panic disorder without agoraphobia   . Rash    HANDS, ARMS --STATES HX OF RASH EVER SINCE CHILDBIRTH/PREGNANCY.  STATES THE RASH OFTEN OCCURS WHEN SHE IS REALLY STRESSED."goes and comes-presently left ring finger"  . Restless leg syndrome    DIAGNOSED BY SLEEP STUDY - PT TOLD SHE DID NOT HAVE SLEEP APNEA  . Right rotator cuff tear    PAIN IN RIGHT SHOULDER  . SBO (small bowel obstruction) 06/03/2012  . Spastic hemiplegia affecting dominant side ([place])   . Stroke ([place])   . Ventral hernia    RIGHT LOWER QUADRANT-CAUSING SOME PAIN    Past Surgical History:  Procedure Laterality Date  . APPLICATION OF WOUND VAC N/A 05/25/2015   Procedure: APPLICATION OF WOUND VAC;  Surgeon: Michael Boston, MD;  Location: [facility];  Service: General;  Laterality: N/A;  . CESAREAN SECTION  2012  . DIAGNOSTIC LAPAROSCOPY    . ESOPHAGOGASTRODUODENOSCOPY N/A 06/03/2012   Procedure: ESOPHAGOGASTRODUODENOSCOPY (EGD);  Surgeon: [name] Craver, MD;  Location[facility];  Service: Endoscopy;  Laterality: N/A;  . EXCISION MASS ABDOMINAL N/A 05/25/2015   Procedure: ABDOMINAL WALL EXPLORATION EXCISION OF SEROMA REMOVAL OF REDUNDANT SKIN ;  Surgeon: Michael Boston, MD;  Location: [facility];  Service: General;  Laterality: N/A;  . EYE SURGERY     Eye laser for vessel hemorrhaging  . HERNIA REPAIR  10/03/11    ventral hernia repair  . INSERTION OF MESH N/A 02/04/2013   Procedure: INSERTION OF MESH;  Surgeon: Adin Hector, MD;  Location: [facility];  Service: General;  Laterality: N/A;  . UMBILICAL HERNIA REPAIR N/A 02/04/2013   Procedure: LAPAROSCOPIC ventral wall hernia repair LAPAROSCOPIC LYSIS OF ADHESIONS laparoscopic exploration of abdomen ;  Surgeon: Adin Hector, MD;  Location: [facility];  Service: General;  Laterality: N/A;  . URETER REVISION     Bilateral "twisted"  . UTERINE FIBROID SURGERY     2 SURGERIES FOR FIBROIDS  . VENTRAL HERNIA REPAIR  10/03/2011   Procedure: LAPAROSCOPIC VENTRAL HERNIA;  Surgeon: Adin Hector, MD;  Location: [facility];  Service: General;  Laterality: N/A;    Current Outpatient Prescriptions  Medication Sig Dispense Refill  . allopurinol (ZYLOPRIM) 100 MG tablet Take 100 mg by mouth every evening.    . Amlodipine-Valsartan-HCTZ 10-320-25 MG TABS Take 1 tablet by mouth every morning.     Marland Kitchen aspirin 325 MG tablet Take 325 mg by mouth at bedtime.     . colchicine 0.6 MG tablet Take 0.6 mg by mouth daily as needed (For gout flare-up.).   1  . furosemide (LASIX) 40 MG tablet Take 20-40 mg by mouth daily as needed for fluid or edema.     . gabapentin (NEURONTIN) 600 MG tablet TAKE 5 TABLETS BY MOUTH DAILY AS DIRECTED  99  . Insulin Glargine (BASAGLAR KWIKPEN) 100 UNIT/ML SOPN INJECT 25 UNITS TWICE A DAY  99  . lidocaine (XYLOCAINE) 5 % ointment Reported on 07/31/2015    . Liraglutide (VICTOZA) 18 MG/3ML SOPN Inject 1.2 mg into the skin daily.     Marland Kitchen NOVOLOG FLEXPEN 100 UNIT/ML FlexPen Inject 5-12 Units into the skin 3 (three) times daily with meals.   99  . ondansetron (ZOFRAN) 4 MG tablet Take 1 tablet (4 mg total) by mouth every 6 (six) hours as needed for nausea. 20 tablet 0  . ONE TOUCH ULTRA TEST test strip     . tiZANidine (ZANAFLEX) 2 MG tablet Take 1 tablet at bedtime  daily. Can take 1/2 tablet in the AM PRN. 45 tablet 3  . traMADol (ULTRAM) 50 MG tablet Take 1  tablet (50 mg total) by mouth 2 (two) times daily as needed for moderate pain or severe pain. 60 tablet 5   No current facility-administered medications for this visit.     Allergies as of 11/01/2015 - Review Complete 11/01/2015  Allergen Reaction Noted  . Contrast media [iodinated diagnostic agents] Other (See Comments) 02/03/2011  . Iohexol Hives, Nausea And Vomiting, and Swelling 04/07/2004  . Midazolam hcl Anaphylaxis 02/03/2011  . Shellfish allergy Anaphylaxis 02/03/2011  . Metformin and related Diarrhea and Nausea And Vomiting 02/03/2011  . Other Itching 08/01/2011  . Avandia [rosiglitazone maleate] Hives and Other (See Comments) 02/03/2011  . Geodon [ziprasidone] Other (See Comments) 12/16/2011  . Kiwi extract Itching and Swelling 02/03/2011  . Latex Itching 02/02/2014    Vitals: BP 130/70   Pulse 68   Resp 20   Ht 5\' 6"  (1.676 m)   Wt 233 lb (105.7 kg)   BMI 37.61 kg/m  Last Weight:  Wt Readings from Last 1 Encounters:  11/01/15 233 lb (105.7 kg)   [id number] mass index is 37.61 kg/m.     Last Height:   Ht Readings from Last 1 Encounters:  11/01/15 5\' 6"  (1.676 m)    Physical exam:  General: The patient is awake, alert and appears not in acute distress. The patient is well groomed. Head: Normocephalic, atraumatic. Neck is supple. Mallampati 3-4 , narrow and peaked palate  neck circumference: 16.5 . Nasal airflow patent , TMJ tenderness on the right is  evident . Retrognathia is seen.  Cardiovascular:  Regular rate and rhythm, without  murmurs or carotid bruit, and without distended neck veins. Respiratory: Lungs are clear to auscultation. Skin:  Without evidence of edema, or rash Trunk: BMI is elevated . The patient's posture is erect.   Neurologic exam : The patient is awake and alert, oriented to place and time.   Memory subjective described as intact.  Memory testing revealed . Attention span & concentration ability appears normal.  Speech is fluent,   without dysarthria,  dysphonia or aphasia.  Mood and affect are appropriate.  Cranial nerves: Pupils are equal and reactive to light. . Extraocular movements  in vertical and horizontal planes intact and without nystagmus.  Hearing to finger rub intact.  Facial sensation intact to fine touch- no numbness. She feels sometimes as if one side of her face is heavier and droopy..  Facial motor strength  noticeable very mild lower facial droop. Left-sided but  tongue and uvula move midline. Shoulder shrug was symmetrical.   Motor exam:     Grip strength is slightly weaker on the right than on the left and the patient is right-hand dominant.  Sensory:  Fine touch, pinprick and vibration were reduced in her feet up to the mid cough level. She does have diabetic length dependent neuropathy. Sometimes she will have pin and needle or even zinging sensations radiating from her feet up.  Coordination: Finger-to-nose maneuver normal with dysmetria on the left, not tremor.  Gait and station: Patient walks without assistive device and is able unassisted to climb up to the exam table.  Strength within normal limits.  Stance is stable and normal. Turns to the left with 4 steps, drifts. Turns ot the righ with 3 steps- she always feels herself drifting to the right the weaker side. Deep tendon reflexes: Tina [name] deep tendon reflexes are brisk  throughout the right side, while the left side is moderate. Babinski on the right positive.  Dr Tina [name] and Roney Marion last note , 07-31-2015  1. History of stroke 2. Tension type headaches 3. frequent falls, Abnormality of gait. Last fall (down some stairs !) was 10-22-2015 . 4. Snoring, no OSA ( AHI was 3.0)  5. Daytime sleepiness- RLS related, PLMs documented.      The patient was advised of the nature of the diagnosed sleep disorder , the treatment options and risks for general a health and wellness arising from not treating the condition. I would consider  Tina [name] excessively daytime sleepy but certainly excessively fatigued. The my course sleep attacks that she seems to have at midday are also concerning is able affect her ability and safety operating machinery or driving. She does not report narcolepsy symptoms such as sleep paralysis dream intrusion stream hallucinations, but she is also on medications that may suppress REM sleep. As I mentioned above, she takes tizanidine at bedtime. I spent more than 50 minutes of face to face time with the patient. Greater than 50% of time was spent in counseling and coordination of care. We have discussed the diagnosis and differential and I answered the patient's questions.     Assessment:  After physical and neurologic examination, review of laboratory studies,  Personal review of imaging studies, reports of other /same  Imaging studies ,  Results of polysomnography/ neurophysiology testing and pre-existing records as far as provided in visit., my assessment is   1) Tina [name] suffered a stroke at a very young age, and her adopted twins were just with her for about 3 months. And shortly after their adoption was finalized she became pregnant was her only biological child, and no 68-year-old son who was born prematurely.  Excessive daytime sleepiness is sometimes seen after right hemispheric brain strokes but Tina [name] stroke was in the left. She does have severe spasms in her right lower extremity and these may be manifesting as periodic limb movements. She had evidence of PLMs, right more than left during her sleep study, and has diabetic neuropathy. Loss of vibration sensation in both feet.   Plan:  Treatment plan and additional workup :  I was not able to detect any sleep disordered breathing of significance but periodic limb movements were definitely present during her sleep study she also endorses the [facility] restless leg questionnaires as a moderate to severe impairment in her quality of  life. I will order an iron and ferritin level today to see if she is having iron deficiency anemia related restless leg syndrome. In addition, I wanted to continue all her stroke prevention medication to add a vitamin B complex, vitamin D. Tizanidine at night helps and continues to help with sleep, RLS. If not controlling her restless legs and PLMS as successful anymore he may have to change to add dopaminergic such as Requip or Mirapex at a low dose   She will follow-up in 3 months with Dr. [name]  NP    Asencion Partridge Amos Gaber MD  11/01/2015   CC: Antony Contras, MD

## 2015-11-02 ENCOUNTER — Telehealth: Payer: Self-pay | Admitting: Neurology

## 2015-11-02 ENCOUNTER — Ambulatory Visit: Payer: Federal, State, Local not specified - PPO | Admitting: Registered Nurse

## 2015-11-02 DIAGNOSIS — N938 Other specified abnormal uterine and vaginal bleeding: Secondary | ICD-10-CM | POA: Diagnosis not present

## 2015-11-02 LAB — IRON AND TIBC
IRON: 114 ug/dL (ref 27–159)
Iron Saturation: 31 % (ref 15–55)
Total Iron Binding Capacity: 372 ug/dL (ref 250–450)
UIBC: 258 ug/dL (ref 131–425)

## 2015-11-02 LAB — FERRITIN: Ferritin: 35 ng/mL (ref 15–150)

## 2015-11-02 NOTE — Telephone Encounter (Signed)
Iron-storage has been supplemented effectively, the patient is taking an oral iron supplement. However her ferritin level is still under 50, and she needs to continue taking iron. Restless leg syndrome is closely associated with ferritin levels lower than 50 nano/

## 2015-11-05 ENCOUNTER — Encounter: Payer: Self-pay | Admitting: Registered Nurse

## 2015-11-05 ENCOUNTER — Encounter: Payer: Federal, State, Local not specified - PPO | Attending: Registered Nurse | Admitting: Registered Nurse

## 2015-11-05 VITALS — BP 118/72 | HR 84 | Resp 16

## 2015-11-05 DIAGNOSIS — Z841 Family history of disorders of kidney and ureter: Secondary | ICD-10-CM | POA: Diagnosis not present

## 2015-11-05 DIAGNOSIS — G43909 Migraine, unspecified, not intractable, without status migrainosus: Secondary | ICD-10-CM | POA: Diagnosis not present

## 2015-11-05 DIAGNOSIS — M545 Low back pain: Secondary | ICD-10-CM | POA: Diagnosis not present

## 2015-11-05 DIAGNOSIS — M62838 Other muscle spasm: Secondary | ICD-10-CM | POA: Insufficient documentation

## 2015-11-05 DIAGNOSIS — I10 Essential (primary) hypertension: Secondary | ICD-10-CM | POA: Insufficient documentation

## 2015-11-05 DIAGNOSIS — E1140 Type 2 diabetes mellitus with diabetic neuropathy, unspecified: Secondary | ICD-10-CM | POA: Insufficient documentation

## 2015-11-05 DIAGNOSIS — M75101 Unspecified rotator cuff tear or rupture of right shoulder, not specified as traumatic: Secondary | ICD-10-CM

## 2015-11-05 DIAGNOSIS — G8929 Other chronic pain: Secondary | ICD-10-CM | POA: Diagnosis not present

## 2015-11-05 DIAGNOSIS — Z823 Family history of stroke: Secondary | ICD-10-CM | POA: Insufficient documentation

## 2015-11-05 DIAGNOSIS — G2581 Restless legs syndrome: Secondary | ICD-10-CM | POA: Insufficient documentation

## 2015-11-05 DIAGNOSIS — Z8673 Personal history of transient ischemic attack (TIA), and cerebral infarction without residual deficits: Secondary | ICD-10-CM

## 2015-11-05 DIAGNOSIS — F418 Other specified anxiety disorders: Secondary | ICD-10-CM | POA: Diagnosis not present

## 2015-11-05 DIAGNOSIS — I69351 Hemiplegia and hemiparesis following cerebral infarction affecting right dominant side: Secondary | ICD-10-CM

## 2015-11-05 DIAGNOSIS — Z833 Family history of diabetes mellitus: Secondary | ICD-10-CM | POA: Insufficient documentation

## 2015-11-05 DIAGNOSIS — I63139 Cerebral infarction due to embolism of unspecified carotid artery: Secondary | ICD-10-CM

## 2015-11-05 DIAGNOSIS — R202 Paresthesia of skin: Secondary | ICD-10-CM | POA: Diagnosis not present

## 2015-11-05 DIAGNOSIS — Z8249 Family history of ischemic heart disease and other diseases of the circulatory system: Secondary | ICD-10-CM | POA: Diagnosis not present

## 2015-11-05 MED ORDER — TRAMADOL HCL 50 MG PO TABS: 50.0000 mg | ORAL_TABLET | Freq: Two times a day (BID) | ORAL | 5 refills | Status: DC | PRN

## 2015-11-05 NOTE — Telephone Encounter (Signed)
I spoke to pt regarding her lab results. She says that she will continue taking her iron but would really like to start something for her RLS and does not want to wait until Dr. [name] sees her again. Per Dr. Edwena Felty note, "Tizanidine at night helps and continues to help with sleep, RLS. If not controlling her restless legs and PLMS as successful anymore he may have to change to add dopaminergic such as Requip or Mirapex at a low dose"  Dr. Brett Fairy, pt is asking that you consider placing her on a medication for her RLS.

## 2015-11-05 NOTE — Progress Notes (Signed)
Subjective:    Patient ID: Tina [name], female    DOB: [birth year], 45 y.o.   MRN: [id number]  HPI: [name] is a 45 year old female who returns for follow up appointment and medication refill. She states her pain is located in her right shoulder, right hip and right knee. She rates her pain 6. Her current exercise regime is performing stretching exercises and walking. Also states she has fallen on 10/16/2015 she states she was walking down her stairs in her home and had socks on at the time and slipped on carpet. She picked herself up she didn't seek medical attention. Reviewed Falls Prevention, she verbalizes understanding, neurology following.   Pain Inventory Average Pain 6 Pain Right Now 6 My pain is sharp, stabbing, tingling and aching  In the last 24 hours, has pain interfered with the following? General activity 6 Relation with others 6 Enjoyment of life 6 What TIME of day is your pain at its worst? daytime, evening, night Sleep (in general) Fair  Pain is worse with: bending, sitting, standing and some activites Pain improves with: heat/ice, therapy/exercise, pacing activities, medication and injections Relief from Meds: 7  Mobility walk without assistance Do you have any goals in this area?  no  Function disabled: date disabled .  Neuro/Psych weakness numbness spasms dizziness confusion depression anxiety  Prior Studies Any changes since last visit?  no  Physicians involved in your care Any changes since last visit?  no   Family History  Problem Relation Age of Onset  . Diabetes Father   . Kidney disease Father   . Diabetes Maternal Grandmother   . Hyperlipidemia Paternal Grandmother   . Stroke Paternal Grandmother   . Other Neg Hx    Social History   Social History  . Marital status: Married    Spouse name: N/A  . Number of children: 2  . Years of education: BA degree   Occupational History  .  Unemployed   Social History  Main Topics  . Smoking status: Never Smoker  . Smokeless tobacco: Never Used  . Alcohol use No  . Drug use: No  . Sexual activity: Yes    Birth control/ protection: None   Other Topics Concern  . None   Social History Narrative   Patient is married with 2 children.   Patient is right handed.   Patient has her  BA degree.   Patient drinks 1 cup daily.   Past Surgical History:  Procedure Laterality Date  . APPLICATION OF WOUND VAC N/A 05/25/2015   Procedure: APPLICATION OF WOUND VAC;  Surgeon: Michael Boston, MD;  Location: [facility];  Service: General;  Laterality: N/A;  . CESAREAN SECTION  2012  . DIAGNOSTIC LAPAROSCOPY    . ESOPHAGOGASTRODUODENOSCOPY N/A 06/03/2012   Procedure: ESOPHAGOGASTRODUODENOSCOPY (EGD);  Surgeon: [name] Craver, MD;  Location: [facility]COPY;  Service: Endoscopy;  Laterality: N/A;  . EXCISION MASS ABDOMINAL N/A 05/25/2015   Procedure: ABDOMINAL WALL EXPLORATION EXCISION OF SEROMA REMOVAL OF REDUNDANT SKIN ;  Surgeon: Michael Boston, MD;  Location: [facility];  Service: General;  Laterality: N/A;  . EYE SURGERY     Eye laser for vessel hemorrhaging  . HERNIA REPAIR  10/03/11   ventral hernia repair  . INSERTION OF MESH N/A 02/04/2013   Procedure: INSERTION OF MESH;  Surgeon: Adin Hector, MD;  [facility]: [facility];  Service: General;  Laterality: N/A;  . UMBILICAL HERNIA REPAIR N/A 02/04/2013   Procedure: LAPAROSCOPIC ventral  wall hernia repair LAPAROSCOPIC LYSIS OF ADHESIONS laparoscopic exploration of abdomen ;  Surgeon: Adin Hector, MD;  Location: [facility];  Service: General;  Laterality: N/A;  . URETER REVISION     Bilateral "twisted"  . UTERINE FIBROID SURGERY     2 SURGERIES FOR FIBROIDS  . VENTRAL HERNIA REPAIR  10/03/2011   Procedure: LAPAROSCOPIC VENTRAL HERNIA;  Surgeon: Adin Hector, MD;  [facility]: [facility];  Service: General;  Laterality: N/A;   Past Medical History:  Diagnosis Date  . Anemia    DURING MENSES--HAS HEAVY BLEEDING WITH PERODS  . Anxiety     . Back pain, chronic    "ongoing"  . Blood transfusion    IN 2012  AFTER C -SECTION  . Cerebral thrombosis with cerebral infarction [facility]) JUNE 2011   RIGHT SIDED WEAKNESS ( ARM AND LEG ) AND SPASMS-remains with slight weakness and vertigo.  . Depression   . Diabetes mellitus   . Diabetic neuropathy ([place])    BOTH FEET --COMES AND GOES  . Endometriosis   . Headache(784.0)    MIGRAINES--NOT REALLY HEADACHE-MORE LIKE PRESSURE SENSATION IN HEAD-FEELS DIZZIY AND  FAINT AS THE PRESSURE RESOLVES  . Hernia, incisional, RLQ, s/p lap repair Sep 2013 09/02/2011  . Hx of migraines 10/19/2011  . Hypertension   . Leg pain, right    "like bad Charley horse"  . Panic disorder without agoraphobia   . Rash    HANDS, ARMS --STATES HX OF RASH EVER SINCE CHILDBIRTH/PREGNANCY.  STATES THE RASH OFTEN OCCURS WHEN SHE IS REALLY STRESSED."goes and comes-presently left ring finger"  . Restless leg syndrome    DIAGNOSED BY SLEEP STUDY - PT TOLD SHE DID NOT HAVE SLEEP APNEA  . Right rotator cuff tear    PAIN IN RIGHT SHOULDER  . SBO (small bowel obstruction) 06/03/2012  . Spastic hemiplegia affecting dominant side ([place])   . Stroke ([place])   . Ventral hernia    RIGHT LOWER QUADRANT-CAUSING SOME PAIN   BP 118/72 (BP Location: Right Arm, Patient Position: Sitting, Cuff Size: Large)   Pulse 84   Resp 16   SpO2 97%   Opioid Risk Score:   Fall Risk Score:  `1  Depression screen PHQ 2/9  Depression screen PHQ 2/9 03/08/2015  Decreased Interest 2  Down, Depressed, Hopeless 2  PHQ - 2 Score 4  Altered sleeping 3  Tired, decreased energy 3  Change in appetite 1  Feeling bad or failure about yourself  2  Trouble concentrating 3  Moving slowly or fidgety/restless 2  Suicidal thoughts 1  PHQ-9 Score 19  Difficult doing work/chores Somewhat difficult    Review of Systems  Constitutional: Positive for diaphoresis.  HENT: Negative.   Eyes: Negative.   Respiratory: Negative.   Cardiovascular: Negative.    Gastrointestinal: Positive for abdominal pain and nausea.  Endocrine: Negative.        High blood sugar  Genitourinary: Negative.   Musculoskeletal: Positive for arthralgias and back pain.       Spasms   Skin: Positive for rash.  Allergic/Immunologic: Negative.   Neurological: Positive for dizziness, weakness and numbness.  Hematological: Negative.   Psychiatric/Behavioral: Positive for confusion and dysphoric mood. The patient is nervous/anxious.        Objective:   Physical Exam  Constitutional: She is oriented to person, place, and time. She appears well-developed and well-nourished.  HENT:  Head: Normocephalic and atraumatic.  Neck: Normal range of motion. Neck supple.  Cardiovascular: Normal rate  and regular rhythm.   Pulmonary/Chest: Effort normal and breath sounds normal.  Musculoskeletal:  Normal Muscle Bulk and Muscle Testing Reveals: Upper Extremities: Full ROM and Muscle Strength 5/5 Lower Extremities: Full ROM and Muscle Strength 5/5 Arises from Table with Ease  Narrow Based Gait  Neurological: She is alert and oriented to person, place, and time.  Skin: Skin is warm and dry.  Psychiatric: She has a normal mood and affect.  Nursing note and vitals reviewed.         Assessment & Plan:  1. Right hemiparesis secondary to CVA, Deficits are stable, chronic problems with balance. Continue stroke prevention: Encouraged to continue to use her  assistive devices.Continue to Monitor 2. Post stroke shoulder pain: continue HEP and to perform stretching exercises for the right shoulder 3. Neuropathy related pain secondary to diabetes: Continue gabapentin 4.Low Back Pain/ Mainly Right Side: Continue HEP  Refilled Tramadol 50 mg twice a day as needed #60 5.Muscle Spasms: Continue Tizanidine 6. Falls: Neurology Following  30 minutes of face to face patient care time was spent during this visit. All questions were encouraged and answered.  F/U in 6 months.

## 2015-11-06 DIAGNOSIS — M65312 Trigger thumb, left thumb: Secondary | ICD-10-CM | POA: Diagnosis not present

## 2015-11-06 MED ORDER — ROPINIROLE HCL 0.5 MG PO TABS: 0.5000 mg | ORAL_TABLET | Freq: Every day | ORAL | 1 refills | Status: DC

## 2015-11-06 NOTE — Addendum Note (Signed)
Addended by: Larey Seat on: 11/06/2015 05:01 PM   Modules accepted: Orders

## 2015-11-07 NOTE — Telephone Encounter (Signed)
Patient is returning your call.  

## 2015-11-07 NOTE — Telephone Encounter (Signed)
I called pt to discuss requip. No answer, left a message asking her to call me back.

## 2015-11-07 NOTE — Telephone Encounter (Signed)
I spoke to pt. I advised her that Dr. Brett Fairy prescribed the lowest dose of requip to help with her RLS. Side effects reviewed with pt. Pt verbalized understanding and gratitude.

## 2015-11-09 ENCOUNTER — Ambulatory Visit: Payer: Federal, State, Local not specified - PPO | Admitting: Neurology

## 2015-11-14 ENCOUNTER — Telehealth: Payer: Self-pay | Admitting: Neurology

## 2015-11-14 NOTE — Telephone Encounter (Signed)
This patient was already informed of the iron study results, and should continue to take iron.   CD

## 2015-11-16 DIAGNOSIS — N938 Other specified abnormal uterine and vaginal bleeding: Secondary | ICD-10-CM | POA: Diagnosis not present

## 2015-11-16 DIAGNOSIS — N920 Excessive and frequent menstruation with regular cycle: Secondary | ICD-10-CM | POA: Diagnosis not present

## 2015-11-18 ENCOUNTER — Encounter (HOSPITAL_COMMUNITY): Payer: Self-pay | Admitting: Emergency Medicine

## 2015-11-18 ENCOUNTER — Emergency Department (HOSPITAL_COMMUNITY): Payer: Federal, State, Local not specified - PPO

## 2015-11-18 ENCOUNTER — Emergency Department (HOSPITAL_COMMUNITY)
Admission: EM | Admit: 2015-11-18 | Discharge: 2015-11-18 | Disposition: A | Discharge status: Home / Self Care | Payer: Federal, State, Local not specified - PPO | Attending: Emergency Medicine | Admitting: Emergency Medicine

## 2015-11-18 DIAGNOSIS — R05 Cough: Secondary | ICD-10-CM | POA: Diagnosis not present

## 2015-11-18 DIAGNOSIS — Z79899 Other long term (current) drug therapy: Secondary | ICD-10-CM | POA: Diagnosis not present

## 2015-11-18 DIAGNOSIS — E1140 Type 2 diabetes mellitus with diabetic neuropathy, unspecified: Secondary | ICD-10-CM | POA: Insufficient documentation

## 2015-11-18 DIAGNOSIS — R0602 Shortness of breath: Secondary | ICD-10-CM | POA: Diagnosis not present

## 2015-11-18 DIAGNOSIS — Z794 Long term (current) use of insulin: Secondary | ICD-10-CM | POA: Insufficient documentation

## 2015-11-18 DIAGNOSIS — R0789 Other chest pain: Secondary | ICD-10-CM | POA: Diagnosis not present

## 2015-11-18 DIAGNOSIS — T783XXA Angioneurotic edema, initial encounter: Secondary | ICD-10-CM | POA: Diagnosis not present

## 2015-11-18 DIAGNOSIS — Z7982 Long term (current) use of aspirin: Secondary | ICD-10-CM | POA: Diagnosis not present

## 2015-11-18 DIAGNOSIS — Z91040 Latex allergy status: Secondary | ICD-10-CM | POA: Insufficient documentation

## 2015-11-18 DIAGNOSIS — Z888 Allergy status to other drugs, medicaments and biological substances status: Secondary | ICD-10-CM

## 2015-11-18 DIAGNOSIS — T464X5A Adverse effect of angiotensin-converting-enzyme inhibitors, initial encounter: Secondary | ICD-10-CM | POA: Diagnosis not present

## 2015-11-18 DIAGNOSIS — Z8673 Personal history of transient ischemic attack (TIA), and cerebral infarction without residual deficits: Secondary | ICD-10-CM | POA: Diagnosis not present

## 2015-11-18 DIAGNOSIS — I10 Essential (primary) hypertension: Secondary | ICD-10-CM | POA: Diagnosis not present

## 2015-11-18 DIAGNOSIS — T7840XA Allergy, unspecified, initial encounter: Secondary | ICD-10-CM | POA: Diagnosis not present

## 2015-11-18 DIAGNOSIS — R079 Chest pain, unspecified: Secondary | ICD-10-CM | POA: Diagnosis not present

## 2015-11-18 LAB — BASIC METABOLIC PANEL
ANION GAP: 9 (ref 5–15)
BUN: 11 mg/dL (ref 6–20)
CALCIUM: 9 mg/dL (ref 8.9–10.3)
CO2: 19 mmol/L — ABNORMAL LOW (ref 22–32)
Chloride: 107 mmol/L (ref 101–111)
Creatinine, Ser: 0.8 mg/dL (ref 0.44–1.00)
GFR calc Af Amer: >60 mL/min (ref >60)
Glucose, Bld: 179 mg/dL — ABNORMAL HIGH (ref 65–99)
POTASSIUM: 4.3 mmol/L (ref 3.5–5.1)
SODIUM: 135 mmol/L (ref 135–145)

## 2015-11-18 LAB — I-STAT TROPONIN, ED
TROPONIN I, POC: 0 ng/mL (ref 0.00–0.08)
Troponin i, poc: 0 ng/mL (ref 0.00–0.08)

## 2015-11-18 LAB — CBC WITH DIFFERENTIAL/PLATELET
BASOS ABS: 0 10*3/uL (ref 0.0–0.1)
Basophils Relative: 0 %
EOS ABS: 0.2 10*3/uL (ref 0.0–0.7)
EOS PCT: 3 %
HCT: 34.8 % — ABNORMAL LOW (ref 36.0–46.0)
Hemoglobin: 11.3 g/dL — ABNORMAL LOW (ref 12.0–15.0)
Lymphocytes Relative: 16 %
Lymphs Abs: 1 10*3/uL (ref 0.7–4.0)
MCH: 25.3 pg — AB (ref 26.0–34.0)
MCHC: 32.5 g/dL (ref 30.0–36.0)
MCV: 78 fL (ref 78.0–100.0)
Monocytes Absolute: 0.2 10*3/uL (ref 0.1–1.0)
Monocytes Relative: 3 %
Neutro Abs: 5.2 10*3/uL (ref 1.7–7.7)
Neutrophils Relative %: 78 %
PLATELETS: 317 10*3/uL (ref 150–400)
RBC: 4.46 MIL/uL (ref 3.87–5.11)
RDW: 15.8 % — ABNORMAL HIGH (ref 11.5–15.5)
WBC: 6.7 10*3/uL (ref 4.0–10.5)

## 2015-11-18 MED ORDER — DIPHENHYDRAMINE HCL 50 MG/ML IJ SOLN
50.0000 mg | Freq: Once | INTRAMUSCULAR | Status: AC
  Administered 2015-11-18: 50 mg via INTRAVENOUS
  Filled 2015-11-18: qty 1

## 2015-11-18 MED ORDER — DEXAMETHASONE SODIUM PHOSPHATE 10 MG/ML IJ SOLN
10.0000 mg | Freq: Once | INTRAMUSCULAR | Status: AC
  Administered 2015-11-18: 10 mg via INTRAVENOUS
  Filled 2015-11-18: qty 1

## 2015-11-18 MED ORDER — FAMOTIDINE IN NACL 20-0.9 MG/50ML-% IV SOLN
20.0000 mg | Freq: Once | INTRAVENOUS | Status: AC
  Administered 2015-11-18: 20 mg via INTRAVENOUS
  Filled 2015-11-18: qty 50

## 2015-11-18 MED ORDER — AMLODIPINE BESYLATE 10 MG PO TABS: 10.0000 mg | ORAL_TABLET | Freq: Every day | ORAL | 0 refills | Status: DC

## 2015-11-18 MED ORDER — TIZANIDINE HCL 4 MG PO TABS
4.0000 mg | ORAL_TABLET | Freq: Once | ORAL | Status: DC
  Filled 2015-11-18: qty 1

## 2015-11-18 MED ORDER — SODIUM CHLORIDE 0.9 % IV BOLUS (SEPSIS)
1000.0000 mL | Freq: Once | INTRAVENOUS | Status: AC
  Administered 2015-11-18: 1000 mL via INTRAVENOUS

## 2015-11-18 MED ORDER — HYDROCHLOROTHIAZIDE 25 MG PO TABS: 25.0000 mg | ORAL_TABLET | Freq: Every day | ORAL | 0 refills | Status: DC

## 2015-11-18 NOTE — ED Notes (Signed)
Pt ambulated to restroom. 

## 2015-11-18 NOTE — ED Provider Notes (Signed)
[place] DEPT Provider Note   CSN: [id number] Arrival date & time: 11/18/15  0601     History   Chief Complaint Chief Complaint  Patient presents with  . Angioedema     HPI   Tina [name] is a 45 y.o. female complaining of Upper lip swelling which she noticed this morning when she woke at 4:30 AM, states it's getting worse. She denies tongue swelling or shortness of breath but endorses a tingling sensation in her calm, denies nausea, vomiting, history of anaphylactic reaction. She restarted unknown blood pressure medication (chart review states it's amlodipine valsartan and hydrochlorothiazide combination) 2 days ago, she had been on this medication for several years prior, she also started an Requip.  restless leg medication 2 days ago as well. No new soaps, lotions, detergents, pets.   Past Medical History:  Diagnosis Date  . Anemia    DURING MENSES--HAS HEAVY BLEEDING WITH PERODS  . Anxiety   . Back pain, chronic    "ongoing"  . Blood transfusion    IN 2012  AFTER C -SECTION  . Cerebral thrombosis with cerebral infarction [facility]) JUNE 2011   RIGHT SIDED WEAKNESS ( ARM AND LEG ) AND SPASMS-remains with slight weakness and vertigo.  . Depression   . Diabetes mellitus   . Diabetic neuropathy ([place])    BOTH FEET --COMES AND GOES  . Endometriosis   . Headache(784.0)    MIGRAINES--NOT REALLY HEADACHE-MORE LIKE PRESSURE SENSATION IN HEAD-FEELS DIZZIY AND  FAINT AS THE PRESSURE RESOLVES  . Hernia, incisional, RLQ, s/p lap repair Sep 2013 09/02/2011  . Hx of migraines 10/19/2011  . Hypertension   . Leg pain, right    "like bad Charley horse"  . Panic disorder without agoraphobia   . Rash    HANDS, ARMS --STATES HX OF RASH EVER SINCE CHILDBIRTH/PREGNANCY.  STATES THE RASH OFTEN OCCURS WHEN SHE IS REALLY STRESSED."goes and comes-presently left ring finger"  . Restless leg syndrome    DIAGNOSED BY SLEEP STUDY - PT TOLD SHE DID NOT HAVE SLEEP APNEA  . Right rotator cuff  tear    PAIN IN RIGHT SHOULDER  . SBO (small bowel obstruction) 06/03/2012  . Spastic hemiplegia affecting dominant side ([place])   . Stroke ([place])   . Ventral hernia    RIGHT LOWER QUADRANT-CAUSING SOME PAIN    Patient Active Problem List   Diagnosis Date Noted  . OSA (obstructive sleep apnea) 10/17/2015  . Periodic limb movement disorder (PLMD) 10/17/2015  . Essential hypertension 05/26/2015  . Type 2 diabetes mellitus with hyperglycemia, with long-term current use of insulin ([place]) 05/26/2015  . Abdominal wall mass of right lower quadrant 05/25/2015  . Hemiparesis affecting right side as late effect of stroke ([place]) 11/09/2014  . Abdominal wall seroma ([place])   . Sepsis due to undetermined organism ([place]) 03/06/2014  . Nausea vomiting and diarrhea 03/06/2014  . Sepsis ([place]) 03/06/2014  . Tension headache 09/01/2013  . Nausea with vomiting ?Gastroparesis? 03/21/2013  . Spastic hemiplegia affecting dominant side ([place]) 02/16/2013  . Recurrent ventral incisional hernia s/p closure/repair w mesh 02/04/2013 01/26/2013  . Constipation, chronic 01/26/2013  . Abdominal pain, chronic, right lower quadrant 06/03/2012  . History of CVA (cerebrovascular accident) 06/03/2012  . HTN (hypertension) 06/03/2012  . Diabetes mellitus type II, uncontrolled ([place]) 10/16/2011  . Obesity (BMI 30-39.9) 09/02/2011  . Rotator cuff tear 08/25/2011  . Sciatica 06/10/2011  . Paresthesias in right hand 06/10/2011  . Lumbago 05/09/2011  . Paresthesias 05/09/2011  Past Surgical History:  Procedure Laterality Date  . APPLICATION OF WOUND VAC N/A 05/25/2015   Procedure: APPLICATION OF WOUND VAC;  Surgeon: Michael Boston, MD;  [facility];  Service: General;  Laterality: N/A;  . CESAREAN SECTION  2012  . DIAGNOSTIC LAPAROSCOPY    . ESOPHAGOGASTRODUODENOSCOPY N/A 06/03/2012   Procedure: ESOPHAGOGASTRODUODENOSCOPY (EGD);  Surgeon: [name] Craver, MD;  Location[facility]COPY;  Service: Endoscopy;  Laterality: N/A;  .  EXCISION MASS ABDOMINAL N/A 05/25/2015   Procedure: ABDOMINAL WALL EXPLORATION EXCISION OF SEROMA REMOVAL OF REDUNDANT SKIN ;  Surgeon: Michael Boston, MD;  Location: [facility];  Service: General;  Laterality: N/A;  . EYE SURGERY     Eye laser for vessel hemorrhaging  . HERNIA REPAIR  10/03/11   ventral hernia repair  . INSERTION OF MESH N/A 02/04/2013   Procedure: INSERTION OF MESH;  Surgeon: Adin Hector, MD;  [facility];  Service: General;  Laterality: N/A;  . UMBILICAL HERNIA REPAIR N/A 02/04/2013   Procedure: LAPAROSCOPIC ventral wall hernia repair LAPAROSCOPIC LYSIS OF ADHESIONS laparoscopic exploration of abdomen ;  Surgeon: Adin Hector, MD;  Location: [facility];  Service: General;  Laterality: N/A;  . URETER REVISION     Bilateral "twisted"  . UTERINE FIBROID SURGERY     2 SURGERIES FOR FIBROIDS  . VENTRAL HERNIA REPAIR  10/03/2011   Procedure: LAPAROSCOPIC VENTRAL HERNIA;  Surgeon: Adin Hector, MD;  [facility];  Service: General;  Laterality: N/A;    OB History    Gravida Para Term Preterm AB Living   1 1 0 1 0 1   SAB TAB Ectopic Multiple Live Births   0 0 0 0         Home Medications    Prior to Admission medications   Medication Sig Start Date End Date Taking? Authorizing Provider  allopurinol (ZYLOPRIM) 100 MG tablet Take 100 mg by mouth every evening. 03/13/14  Yes Historical Provider, MD  Ascorbic Acid (VITAMIN C PO) Take 1 tablet by mouth daily.   Yes Historical Provider, MD  aspirin 325 MG tablet Take 325 mg by mouth at bedtime.    Yes Historical Provider, MD  colchicine 0.6 MG tablet Take 0.6 mg by mouth daily as needed (For gout flare-up.).  02/14/14  Yes Historical Provider, MD  Cyanocobalamin (VITAMIN B-12 PO) Take 1 tablet by mouth daily.   Yes Historical Provider, MD  furosemide (LASIX) 40 MG tablet Take 20-40 mg by mouth daily as needed for fluid or edema.    Yes Historical Provider, MD  gabapentin (NEURONTIN) 600 MG tablet Take 600-1,200 mg by  mouth See admin instructions. Take 2 tablets every morning, 1 tablet at noon and 2 tablets every evening   Yes Historical Provider, MD  Insulin Glargine (BASAGLAR KWIKPEN) 100 UNIT/ML SOPN Inject 10 Units into the skin 2 (two) times daily.   Yes Historical Provider, MD  Liraglutide (VICTOZA) 18 MG/3ML SOPN Inject 1.2 mg into the skin daily.    Yes Historical Provider, MD  NOVOLOG FLEXPEN 100 UNIT/ML FlexPen Inject 5-12 Units into the skin 3 (three) times daily with meals. Sliding scale 12/14/13  Yes Historical Provider, MD  rOPINIRole (REQUIP) 0.5 MG tablet Take 1 tablet (0.5 mg total) by mouth at bedtime. 11/06/15  Yes [name] Dohmeier, MD  tiZANidine (ZANAFLEX) 2 MG tablet Take 1 tablet at bedtime daily. Can take 1/2 tablet in the AM PRN. 07/31/15  Yes [name], NP  traMADol (ULTRAM) 50 MG tablet  Take 1 tablet (50 mg total) by mouth 2 (two) times daily as needed for moderate pain or severe pain. 11/05/15  Yes Bayard Hugger, NP  amLODipine (NORVASC) 10 MG tablet Take 1 tablet (10 mg total) by mouth daily. 11/18/15   Treyvon Blahut, PA-C  hydrochlorothiazide (HYDRODIURIL) 25 MG tablet Take 1 tablet (25 mg total) by mouth daily. 11/18/15   Amardeep Beckers, PA-C  ondansetron (ZOFRAN) 4 MG tablet Take 1 tablet (4 mg total) by mouth every 6 (six) hours as needed for nausea. Patient not taking: Reported on 11/18/2015 03/08/14   Jonetta Osgood, MD  ONE TOUCH ULTRA TEST test strip  06/30/15   Historical Provider, MD    Family History Family History  Problem Relation Age of Onset  . Diabetes Father   . Kidney disease Father   . Diabetes Maternal Grandmother   . Hyperlipidemia Paternal Grandmother   . Stroke Paternal Grandmother   . Other Neg Hx     Social History Social History  Substance Use Topics  . Smoking status: Never Smoker  . Smokeless tobacco: Never Used  . Alcohol use No     Allergies   Angiotensin receptor blockers; Contrast media [iodinated diagnostic agents]; Iohexol;  Midazolam hcl; Shellfish allergy; Metformin and related; Other; Avandia [rosiglitazone maleate]; Geodon [ziprasidone]; Kiwi extract; and Latex   Review of Systems Review of Systems  10 systems reviewed and found to be negative, except as noted in the HPI.  Physical Exam Updated Vital Signs BP 132/72   Pulse 107   Temp 97.8 F (36.6 C) (Oral)   Resp (!) 29   Ht 5\' 6"  (1.676 m)   Wt 104.3 kg   LMP 10/29/2015 (Within Days)   SpO2 100%   BMI 37.12 kg/m   Physical Exam  Constitutional: She is oriented to person, place, and time. She appears well-developed and well-nourished. No distress.  HENT:  Head: Normocephalic and atraumatic.  Mouth/Throat: Oropharynx is clear and moist.  Eyes: Conjunctivae and EOM are normal. Pupils are equal, round, and reactive to light.  Neck: Normal range of motion.  Cardiovascular: Normal rate, regular rhythm and intact distal pulses.   Pulmonary/Chest: Effort normal and breath sounds normal.  No stridor or drooling. No posterior pharynx edema, moderate upper lip swelling with no significant tongue swelling.  Mallampati class IV   Pt reclining comfortably, speaking in complete sentences.   No Wheezing, excellent air movement in all fields.    Abdominal: Soft. There is no tenderness.  Musculoskeletal: Normal range of motion.  Neurological: She is alert and oriented to person, place, and time.  Skin: She is not diaphoretic.  Psychiatric: She has a normal mood and affect.  Nursing note and vitals reviewed.    ED Treatments / Results  Labs (all labs ordered are listed, but only abnormal results are displayed) Labs Reviewed  CBC WITH DIFFERENTIAL/PLATELET - Abnormal; Notable for the following:       Result Value   Hemoglobin 11.3 (*)    HCT 34.8 (*)    MCH 25.3 (*)    RDW 15.8 (*)    All other components within normal limits  BASIC METABOLIC PANEL - Abnormal; Notable for the following:    CO2 19 (*)    Glucose, Bld 179 (*)    All other  components within normal limits  Randolm Idol, ED  Randolm Idol, ED    EKG  EKG Interpretation  Date/Time:  Sunday November 18 2015 08:59:36 EST Ventricular Rate:  50  PR Interval:    QRS Duration: 93 QT Interval:  379 QTC Calculation: 446 R Axis:   54 Text Interpretation:  Sinus rhythm Low voltage, precordial leads Baseline wander in lead(s) V6 No significant change since last tracing Confirmed by Winfred Leeds  MD, [facility] [phone]) on 11/18/2015 9:06:53 AM Also confirmed by Winfred Leeds  MD, [place] [phone]), editor [place], Leda Gauze [phone])  on 11/18/2015 10:46:26 AM       Radiology Dg Chest 2 View  Result Date: 11/18/2015 CLINICAL DATA:  Awoke this morning with face and throat swelling, chest pain beginning today, shortness of breath, cough, history hypertension and diabetes mellitus EXAM: CHEST  2 VIEW COMPARISON:  03/06/2014 FINDINGS: Minimal enlargement of cardiac silhouette. Mediastinal contours and pulmonary vascularity normal. Peribronchial thickening without infiltrate, pleural effusion, or pneumothorax. Bones unremarkable. IMPRESSION: Minimal cardiac enlargement and bronchitic changes without infiltrate. Electronically Signed   By: Lavonia Dana M.D.   On: 11/18/2015 08:57    Procedures Procedures (including critical care time)  Medications Ordered in ED Medications  tiZANidine (ZANAFLEX) tablet 4 mg (4 mg Oral Not Given 11/18/15 1025)  dexamethasone (DECADRON) injection 10 mg (10 mg Intravenous Given 11/18/15 0620)  famotidine (PEPCID) IVPB 20 mg premix (0 mg Intravenous Stopped 11/18/15 0650)  diphenhydrAMINE (BENADRYL) injection 50 mg (50 mg Intravenous Given 11/18/15 0620)  sodium chloride 0.9 % bolus 1,000 mL (0 mLs Intravenous Stopped 11/18/15 0823)     Initial Impression / Assessment and Plan / ED Course  I have reviewed the triage vital signs and the nursing notes.  Pertinent labs & imaging results that were available during my care of the patient were reviewed by me and  considered in my medical decision making (see chart for details).  Clinical Course     Vitals:   11/18/15 1015 11/18/15 1045 11/18/15 1115 11/18/15 1220  BP: 141/73 (!) 112/49 (!) 124/50 132/72  Pulse: 99 89 94 107  Resp: (!) 31 (!) 30 (!) 29   Temp:      TempSrc:      SpO2: 99% 99% 99% 100%  Weight:      Height:        Medications  tiZANidine (ZANAFLEX) tablet 4 mg (4 mg Oral Not Given 11/18/15 1025)  dexamethasone (DECADRON) injection 10 mg (10 mg Intravenous Given 11/18/15 0620)  famotidine (PEPCID) IVPB 20 mg premix (0 mg Intravenous Stopped 11/18/15 0650)  diphenhydrAMINE (BENADRYL) injection 50 mg (50 mg Intravenous Given 11/18/15 0620)  sodium chloride 0.9 % bolus 1,000 mL (0 mLs Intravenous Stopped 11/18/15 0823)    Amariss CAIAH MCGLATHERY is 45 y.o. female presenting with Acute onset of upper lip swelling, likely secondary to angiotensin receptor inhibitor, lung sounds clear, no other organ involvement. We'll give allergy cocktail and reassess.  6:55 AM Recheck patient, she is resting comfortably states that she has an itch on her foot, there is no hives, no worsening swelling, lung sounds clear to auscultation.  Discussed with pharmacist who states that Requip would not likely cause the angioedema.   Multiple recheck shows slightly improving angioedema to upper lip with clear lung sounds. She has reported to Dr. Cathleen Fears that she has some chest tightness, EKG unchanged, chest x-ray negative, initial troponin negative.   Patient eating comfortably, upper lip continues to improve, will DC her valsartan, requesting her regular dose of muscle relaxer for chronic pain.  L2 troponin negative, upper lip angioedema continues to improve. Lung sounds remained clear, advised her to disclose that she is allergic to arms, blood  pressure medication change, will follow with her primary care physician for blood pressure recheck next week.  Evaluation does not show pathology that would require  ongoing emergent intervention or inpatient treatment. Pt is hemodynamically stable and mentating appropriately. Discussed findings and plan with patient/guardian, who agrees with care plan. All questions answered. Return precautions discussed and outpatient follow up given.    Final Clinical Impressions(s) / ED Diagnoses   Final diagnoses:  Angioedema of lips, initial encounter  Allergy to angiotensin receptor blockers (ARB)  Atypical chest pain    New Prescriptions New Prescriptions   AMLODIPINE (NORVASC) 10 MG TABLET    Take 1 tablet (10 mg total) by mouth daily.   HYDROCHLOROTHIAZIDE (HYDRODIURIL) 25 MG TABLET    Take 1 tablet (25 mg total) by mouth daily.     Monico Blitz, PA-C 11/18/15 [place], MD 11/18/15 [phone]

## 2015-11-18 NOTE — Discharge Instructions (Signed)
YOU ARE ALLERGIC TO angiotensin receptor blockers or ARBS. Please follow with your primary care doctor next week and let them know, don't take her blood pressure medicine, please only take the amlodipine and hydrochlorothiazide written prescriptions for here today.  Please follow with your primary care doctor in the next 2 days for a check-up. They must obtain records for further management.   Do not hesitate to return to the Emergency Department for any new, worsening or concerning symptoms.

## 2015-11-18 NOTE — ED Notes (Signed)
Meal tray ordered 

## 2015-11-18 NOTE — ED Notes (Signed)
Pt transported to xray 

## 2015-11-18 NOTE — ED Triage Notes (Signed)
Pt reports angioedema starting when waking this morning, was last normal at midnight upon going to bed. Pt reports stopped a blood pressure pill she was on last week and restarted on Friday. States no throat, tongue swelling at this time, only lip involvement.

## 2015-11-18 NOTE — ED Notes (Signed)
Family at bedside. 

## 2015-11-18 NOTE — ED Provider Notes (Signed)
Complaint of upper lip swelling onset upon awakening 4:30 AM today she also complains of chest tightness anterior onset 4:30 AM which is constant and unchanging. Not made better or worse by anything. Other associated symptoms include mild sore throat and cough productive of clear sputum for 2 days. She denies any fever. On exam she patient is in no distress up her lip minimally swollen oral pharynx is normal lungs clear auscultation heart regular rate and rhythm no murmurs abdomen obese nontender extremities without edema   Orlie Dakin, MD 11/18/15 1630

## 2015-11-26 ENCOUNTER — Other Ambulatory Visit: Payer: Self-pay

## 2015-11-26 MED ORDER — ROPINIROLE HCL 0.5 MG PO TABS: 0.5000 mg | ORAL_TABLET | Freq: Every day | ORAL | 0 refills | Status: DC

## 2015-11-26 NOTE — Telephone Encounter (Signed)
Pt requesting a 90 day supply of requip. Will send to Dr. Brett Fairy for review.

## 2015-12-03 DIAGNOSIS — I10 Essential (primary) hypertension: Secondary | ICD-10-CM | POA: Diagnosis not present

## 2015-12-03 DIAGNOSIS — E118 Type 2 diabetes mellitus with unspecified complications: Secondary | ICD-10-CM | POA: Diagnosis not present

## 2015-12-04 DIAGNOSIS — E118 Type 2 diabetes mellitus with unspecified complications: Secondary | ICD-10-CM | POA: Diagnosis not present

## 2015-12-04 DIAGNOSIS — I10 Essential (primary) hypertension: Secondary | ICD-10-CM | POA: Diagnosis not present

## 2015-12-04 DIAGNOSIS — T887XXA Unspecified adverse effect of drug or medicament, initial encounter: Secondary | ICD-10-CM | POA: Diagnosis not present

## 2015-12-04 DIAGNOSIS — G2581 Restless legs syndrome: Secondary | ICD-10-CM | POA: Diagnosis not present

## 2015-12-12 ENCOUNTER — Encounter (HOSPITAL_COMMUNITY): Payer: Self-pay | Admitting: *Deleted

## 2015-12-12 ENCOUNTER — Emergency Department (HOSPITAL_COMMUNITY)
Admission: EM | Admit: 2015-12-12 | Discharge: 2015-12-12 | Disposition: A | Discharge status: Home / Self Care | Payer: Federal, State, Local not specified - PPO | Attending: Emergency Medicine | Admitting: Emergency Medicine

## 2015-12-12 DIAGNOSIS — Z8673 Personal history of transient ischemic attack (TIA), and cerebral infarction without residual deficits: Secondary | ICD-10-CM | POA: Insufficient documentation

## 2015-12-12 DIAGNOSIS — Y732 Prosthetic and other implants, materials and accessory gastroenterology and urology devices associated with adverse incidents: Secondary | ICD-10-CM | POA: Diagnosis not present

## 2015-12-12 DIAGNOSIS — E1140 Type 2 diabetes mellitus with diabetic neuropathy, unspecified: Secondary | ICD-10-CM | POA: Diagnosis not present

## 2015-12-12 DIAGNOSIS — T85848A Pain due to other internal prosthetic devices, implants and grafts, initial encounter: Secondary | ICD-10-CM | POA: Insufficient documentation

## 2015-12-12 DIAGNOSIS — Z7982 Long term (current) use of aspirin: Secondary | ICD-10-CM | POA: Diagnosis not present

## 2015-12-12 DIAGNOSIS — Z794 Long term (current) use of insulin: Secondary | ICD-10-CM | POA: Diagnosis not present

## 2015-12-12 DIAGNOSIS — T783XXA Angioneurotic edema, initial encounter: Secondary | ICD-10-CM | POA: Insufficient documentation

## 2015-12-12 DIAGNOSIS — Z91040 Latex allergy status: Secondary | ICD-10-CM | POA: Diagnosis not present

## 2015-12-12 DIAGNOSIS — R220 Localized swelling, mass and lump, head: Secondary | ICD-10-CM | POA: Diagnosis present

## 2015-12-12 DIAGNOSIS — I10 Essential (primary) hypertension: Secondary | ICD-10-CM | POA: Diagnosis not present

## 2015-12-12 LAB — BASIC METABOLIC PANEL
Anion gap: 8 (ref 5–15)
BUN: 11 mg/dL (ref 6–20)
CALCIUM: 8.9 mg/dL (ref 8.9–10.3)
CHLORIDE: 108 mmol/L (ref 101–111)
CO2: 23 mmol/L (ref 22–32)
CREATININE: 0.84 mg/dL (ref 0.44–1.00)
Glucose, Bld: 204 mg/dL — ABNORMAL HIGH (ref 65–99)
Potassium: 3.9 mmol/L (ref 3.5–5.1)
SODIUM: 139 mmol/L (ref 135–145)

## 2015-12-12 LAB — CBC
HCT: 33.6 % — ABNORMAL LOW (ref 36.0–46.0)
Hemoglobin: 10.8 g/dL — ABNORMAL LOW (ref 12.0–15.0)
MCH: 24.9 pg — ABNORMAL LOW (ref 26.0–34.0)
MCHC: 32.1 g/dL (ref 30.0–36.0)
MCV: 77.4 fL — AB (ref 78.0–100.0)
PLATELETS: 380 10*3/uL (ref 150–400)
RBC: 4.34 MIL/uL (ref 3.87–5.11)
RDW: 15.1 % (ref 11.5–15.5)
WBC: 7 10*3/uL (ref 4.0–10.5)

## 2015-12-12 MED ORDER — HYDROXYZINE HCL 25 MG PO TABS
25.0000 mg | ORAL_TABLET | Freq: Once | ORAL | Status: AC
  Administered 2015-12-12: 25 mg via ORAL
  Filled 2015-12-12: qty 1

## 2015-12-12 MED ORDER — DIPHENHYDRAMINE HCL 50 MG/ML IJ SOLN
25.0000 mg | Freq: Once | INTRAMUSCULAR | Status: AC
  Administered 2015-12-12: 25 mg via INTRAVENOUS
  Filled 2015-12-12: qty 1

## 2015-12-12 MED ORDER — METHYLPREDNISOLONE SODIUM SUCC 125 MG IJ SOLR
125.0000 mg | Freq: Once | INTRAMUSCULAR | Status: AC
  Administered 2015-12-12: 125 mg via INTRAVENOUS
  Filled 2015-12-12: qty 2

## 2015-12-12 MED ORDER — CLINDAMYCIN HCL 300 MG PO CAPS: 300.0000 mg | ORAL_CAPSULE | Freq: Three times a day (TID) | ORAL | 0 refills | Status: DC

## 2015-12-12 MED ORDER — HYDROCODONE-ACETAMINOPHEN 5-325 MG PO TABS
1.0000 | ORAL_TABLET | Freq: Once | ORAL | Status: AC
  Administered 2015-12-12: 1 via ORAL
  Filled 2015-12-12: qty 1

## 2015-12-12 MED ORDER — HYDROXYZINE HCL 25 MG PO TABS: 25.0000 mg | ORAL_TABLET | Freq: Four times a day (QID) | ORAL | 0 refills | Status: DC

## 2015-12-12 MED ORDER — FAMOTIDINE IN NACL 20-0.9 MG/50ML-% IV SOLN
20.0000 mg | Freq: Once | INTRAVENOUS | Status: AC
  Administered 2015-12-12: 20 mg via INTRAVENOUS
  Filled 2015-12-12: qty 50

## 2015-12-12 NOTE — ED Triage Notes (Signed)
Pt reports starting new meds last night and recent bp med changes. Woke up with lip swelling. Has itching to tongue, ear and throat. Took benadryl this am. Was seenhere on 11/5 for angioedema.

## 2015-12-12 NOTE — ED Provider Notes (Signed)
[facility] DEPT Provider Note   CSN: [id number] Arrival date & time: 12/12/15  0747     History   Chief Complaint Chief Complaint  Patient presents with  . Allergic Reaction    HPI Tina [name] is a 45 y.o. female.  Pt presents to the ED this morning with swelling to her lower lip.  She was here on 11/5 and was treated for angioedema.  She said it got better.  Her doctor changed her bp medications around and she's been on bystolic for 1 week.  She did take 1 ibuprofen and 1 amox last night as she had a recent dental implant.  She has not taken amox in a long time, but has never had problems with it in the past.      Past Medical History:  Diagnosis Date  . Anemia    DURING MENSES--HAS HEAVY BLEEDING WITH PERODS  . Anxiety   . Back pain, chronic    "ongoing"  . Blood transfusion    IN 2012  AFTER C -SECTION  . Cerebral thrombosis with cerebral infarction [facility]) JUNE 2011   RIGHT SIDED WEAKNESS ( ARM AND LEG ) AND SPASMS-remains with slight weakness and vertigo.  . Depression   . Diabetes mellitus   . Diabetic neuropathy ([place])    BOTH FEET --COMES AND GOES  . Endometriosis   . Headache(784.0)    MIGRAINES--NOT REALLY HEADACHE-MORE LIKE PRESSURE SENSATION IN HEAD-FEELS DIZZIY AND  FAINT AS THE PRESSURE RESOLVES  . Hernia, incisional, RLQ, s/p lap repair Sep 2013 09/02/2011  . Hx of migraines 10/19/2011  . Hypertension   . Leg pain, right    "like bad Charley horse"  . Panic disorder without agoraphobia   . Rash    HANDS, ARMS --STATES HX OF RASH EVER SINCE CHILDBIRTH/PREGNANCY.  STATES THE RASH OFTEN OCCURS WHEN SHE IS REALLY STRESSED."goes and comes-presently left ring finger"  . Restless leg syndrome    DIAGNOSED BY SLEEP STUDY - PT TOLD SHE DID NOT HAVE SLEEP APNEA  . Right rotator cuff tear    PAIN IN RIGHT SHOULDER  . SBO (small bowel obstruction) 06/03/2012  . Spastic hemiplegia affecting dominant side ([place])   . Stroke ([place])   . Ventral hernia    RIGHT LOWER QUADRANT-CAUSING SOME PAIN    Patient Active Problem List   Diagnosis Date Noted  . OSA (obstructive sleep apnea) 10/17/2015  . Periodic limb movement disorder (PLMD) 10/17/2015  . Essential hypertension 05/26/2015  . Type 2 diabetes mellitus with hyperglycemia, with long-term current use of insulin ([place]) 05/26/2015  . Abdominal wall mass of right lower quadrant 05/25/2015  . Hemiparesis affecting right side as late effect of stroke ([place]) 11/09/2014  . Abdominal wall seroma ([place])   . Sepsis due to undetermined organism ([place]) 03/06/2014  . Nausea vomiting and diarrhea 03/06/2014  . Sepsis ([place]) 03/06/2014  . Tension headache 09/01/2013  . Nausea with vomiting ?Gastroparesis? 03/21/2013  . Spastic hemiplegia affecting dominant side ([place]) 02/16/2013  . Recurrent ventral incisional hernia s/p closure/repair w mesh 02/04/2013 01/26/2013  . Constipation, chronic 01/26/2013  . Abdominal pain, chronic, right lower quadrant 06/03/2012  . History of CVA (cerebrovascular accident) 06/03/2012  . HTN (hypertension) 06/03/2012  . Diabetes mellitus type II, uncontrolled ([place]) 10/16/2011  . Obesity (BMI 30-39.9) 09/02/2011  . Rotator cuff tear 08/25/2011  . Sciatica 06/10/2011  . Paresthesias in right hand 06/10/2011  . Lumbago 05/09/2011  . Paresthesias 05/09/2011    Past Surgical History:  Procedure  Laterality Date  . APPLICATION OF WOUND VAC N/A 05/25/2015   Procedure: APPLICATION OF WOUND VAC;  Surgeon: Michael Boston, MD;  Location: [facility] ORS;  Service: General;  Laterality: N/A;  . CESAREAN SECTION  2012  . DIAGNOSTIC LAPAROSCOPY    . ESOPHAGOGASTRODUODENOSCOPY N/A 06/03/2012   Procedure: ESOPHAGOGASTRODUODENOSCOPY (EGD);  Surgeon: [name] Craver, MD;  Location: [facility] ENDOSCOPY;  Service: Endoscopy;  Laterality: N/A;  . EXCISION MASS ABDOMINAL N/A 05/25/2015   Procedure: ABDOMINAL WALL EXPLORATION EXCISION OF SEROMA REMOVAL OF REDUNDANT SKIN ;  Surgeon: Michael Boston, MD;  Location: [facility]  ORS;  Service: General;  Laterality: N/A;  . EYE SURGERY     Eye laser for vessel hemorrhaging  . HERNIA REPAIR  10/03/11   ventral hernia repair  . INSERTION OF MESH N/A 02/04/2013   Procedure: INSERTION OF MESH;  Surgeon: Adin Hector, MD;  Location: [facility] ORS;  Service: General;  Laterality: N/A;  . UMBILICAL HERNIA REPAIR N/A 02/04/2013   Procedure: LAPAROSCOPIC ventral wall hernia repair LAPAROSCOPIC LYSIS OF ADHESIONS laparoscopic exploration of abdomen ;  Surgeon: Adin Hector, MD;  Location: [facility] ORS;  Service: General;  Laterality: N/A;  . URETER REVISION     Bilateral "twisted"  . UTERINE FIBROID SURGERY     2 SURGERIES FOR FIBROIDS  . VENTRAL HERNIA REPAIR  10/03/2011   Procedure: LAPAROSCOPIC VENTRAL HERNIA;  Surgeon: Adin Hector, MD;  Location: [facility] ORS;  Service: General;  Laterality: N/A;    OB History    Gravida Para Term Preterm AB Living   1 1 0 1 0 1   SAB TAB Ectopic Multiple Live Births   0 0 0 0         Home Medications    Prior to Admission medications   Medication Sig Start Date End Date Taking? Authorizing Provider  allopurinol (ZYLOPRIM) 100 MG tablet Take 100 mg by mouth every evening. 03/13/14  Yes Historical Provider, MD  amLODipine (NORVASC) 10 MG tablet Take 1 tablet (10 mg total) by mouth daily. 11/18/15  Yes Nicole Pisciotta, PA-C  amoxicillin (AMOXIL) 875 MG tablet Take 875 mg by mouth 2 (two) times daily. 12/11/15  Yes Historical Provider, MD  Ascorbic Acid (VITAMIN C PO) Take 1 tablet by mouth daily.   Yes Historical Provider, MD  aspirin 325 MG tablet Take 325 mg by mouth at bedtime.    Yes Historical Provider, MD  colchicine 0.6 MG tablet Take 0.6 mg by mouth daily as needed (For gout flare-up.).  02/14/14  Yes Historical Provider, MD  Cyanocobalamin (VITAMIN B-12 PO) Take 1 tablet by mouth daily.   Yes Historical Provider, MD  furosemide (LASIX) 40 MG tablet Take 20-40 mg by mouth daily as needed for fluid or edema.    Yes Historical Provider, MD   gabapentin (NEURONTIN) 600 MG tablet Take 600-1,200 mg by mouth See admin instructions. Take 2 tablets every morning, 1 tablet at noon and 2 tablets every evening   Yes Historical Provider, MD  hydrochlorothiazide (HYDRODIURIL) 25 MG tablet Take 1 tablet (25 mg total) by mouth daily. 11/18/15  Yes Nicole Pisciotta, PA-C  ibuprofen (ADVIL,MOTRIN) 800 MG tablet Take 800 mg by mouth every 8 (eight) hours as needed for pain. 12/11/15  Yes Historical Provider, MD  Insulin Glargine (BASAGLAR KWIKPEN) 100 UNIT/ML SOPN Inject 10 Units into the skin 2 (two) times daily.   Yes Historical Provider, MD  Liraglutide (VICTOZA) 18 MG/3ML SOPN Inject 1.2 mg into the skin daily.    Yes  Historical Provider, MD  NOVOLOG FLEXPEN 100 UNIT/ML FlexPen Inject 5-12 Units into the skin 3 (three) times daily with meals. Sliding scale 12/14/13  Yes Historical Provider, MD  ONE TOUCH ULTRA TEST test strip  06/30/15  Yes Historical Provider, MD  rOPINIRole (REQUIP) 0.5 MG tablet Take 1 tablet (0.5 mg total) by mouth at bedtime. 11/26/15  Yes [name] Dohmeier, MD  tiZANidine (ZANAFLEX) 2 MG tablet Take 1 tablet at bedtime daily. Can take 1/2 tablet in the AM PRN. 07/31/15  Yes [name], NP  traMADol (ULTRAM) 50 MG tablet Take 1 tablet (50 mg total) by mouth 2 (two) times daily as needed for moderate pain or severe pain. 11/05/15  Yes Bayard Hugger, NP  clindamycin (CLEOCIN) 300 MG capsule Take 1 capsule (300 mg total) by mouth 3 (three) times daily. 12/12/15   Isla Pence, MD  hydrOXYzine (ATARAX/VISTARIL) 25 MG tablet Take 1 tablet (25 mg total) by mouth every 6 (six) hours. 12/12/15   Isla Pence, MD  ondansetron (ZOFRAN) 4 MG tablet Take 1 tablet (4 mg total) by mouth every 6 (six) hours as needed for nausea. Patient not taking: Reported on 12/12/2015 03/08/14   Jonetta Osgood, MD    Family History Family History  Problem Relation Age of Onset  . Diabetes Father   . Kidney disease Father   . Diabetes Maternal  Grandmother   . Hyperlipidemia Paternal Grandmother   . Stroke Paternal Grandmother   . Other Neg Hx     Social History Social History  Substance Use Topics  . Smoking status: Never Smoker  . Smokeless tobacco: Never Used  . Alcohol use No     Allergies   Angiotensin receptor blockers; Contrast media [iodinated diagnostic agents]; Iohexol; Midazolam hcl; Shellfish allergy; Metformin and related; Other; Avandia [rosiglitazone maleate]; Geodon [ziprasidone]; Kiwi extract; and Latex   Review of Systems Review of Systems  HENT:       Lip swelling  All other systems reviewed and are negative.    Physical Exam Updated Vital Signs BP 151/76   Pulse 60   Temp 98 F (36.7 C) (Oral)   Resp 21   Ht 5\' 6"  (1.676 m)   Wt 232 lb (105.2 kg)   LMP 10/29/2015 (Within Days)   SpO2 98%   BMI 37.45 kg/m   Physical Exam  Constitutional: She is oriented to person, place, and time. She appears well-developed and well-nourished.  HENT:  Head: Atraumatic.  Right Ear: External ear normal.  Left Ear: External ear normal.  Nose: Nose normal.  Mouth/Throat: Oropharynx is clear and moist.  Lower lip swelling.  No tongue or pharyngeal swelling.  Eyes: Conjunctivae and EOM are normal. Pupils are equal, round, and reactive to light.  Neck: Normal range of motion. Neck supple.  Cardiovascular: Normal rate, regular rhythm, normal heart sounds and intact distal pulses.   Pulmonary/Chest: Effort normal and breath sounds normal.  Abdominal: Soft. Bowel sounds are normal.  Musculoskeletal: Normal range of motion.  Neurological: She is alert and oriented to person, place, and time.  Skin: Skin is warm.  Psychiatric: She has a normal mood and affect. Her behavior is normal. Judgment and thought content normal.  Nursing note and vitals reviewed.    ED Treatments / Results  Labs (all labs ordered are listed, but only abnormal results are displayed) Labs Reviewed  BASIC METABOLIC PANEL -  Abnormal; Notable for the following:       Result Value   Glucose, Bld 204 (*)  All other components within normal limits  CBC - Abnormal; Notable for the following:    Hemoglobin 10.8 (*)    HCT 33.6 (*)    MCV 77.4 (*)    MCH 24.9 (*)    All other components within normal limits    EKG  EKG Interpretation  Date/Time:  Wednesday December 12 2015 08:41:20 EST Ventricular Rate:  63 PR Interval:    QRS Duration: 78 QT Interval:  452 QTC Calculation: 463 R Axis:   49 Text Interpretation:  Sinus rhythm Low voltage, precordial leads Confirmed by Shervin Cypert MD, Chesley Valls ([id number]) on 12/12/2015 8:48:59 AM       Radiology No results found.  Procedures Procedures (including critical care time)  Medications Ordered in ED Medications  famotidine (PEPCID) IVPB 20 mg premix (0 mg Intravenous Stopped 12/12/15 0900)  methylPREDNISolone sodium succinate (SOLU-MEDROL) 125 mg/2 mL injection 125 mg (125 mg Intravenous Given 12/12/15 0830)  diphenhydrAMINE (BENADRYL) injection 25 mg (25 mg Intravenous Given 12/12/15 0830)  HYDROcodone-acetaminophen (NORCO/VICODIN) 5-325 MG per tablet 1 tablet (1 tablet Oral Given 12/12/15 0910)  hydrOXYzine (ATARAX/VISTARIL) tablet 25 mg (25 mg Oral Given 12/12/15 1051)     Initial Impression / Assessment and Plan / ED Course  I have reviewed the triage vital signs and the nursing notes.  Pertinent labs & imaging results that were available during my care of the patient were reviewed by me and considered in my medical decision making (see chart for details).  Clinical Course     Pt observed and lip swelling has improved.  She is encouraged to stop amox.  I will write her for clinda instead.   Pt given the number to [facility].  I did not give her prednisone as her blood sugar is high.  She knows to keep a close eye on her blood sugar today.  Pt knows to return if worse.  Final Clinical Impressions(s) / ED Diagnoses   Final diagnoses:    Angioedema, initial encounter  Dental implant pain, initial encounter    New Prescriptions New Prescriptions   CLINDAMYCIN (CLEOCIN) 300 MG CAPSULE    Take 1 capsule (300 mg total) by mouth 3 (three) times daily.   HYDROXYZINE (ATARAX/VISTARIL) 25 MG TABLET    Take 1 tablet (25 mg total) by mouth every 6 (six) hours.     Isla Pence, MD 12/12/15 1130

## 2015-12-12 NOTE — ED Notes (Signed)
Pt states she understands instructions. Pt home stable with family with steady gait.

## 2015-12-12 NOTE — ED Notes (Addendum)
Pt given water 

## 2016-01-21 DIAGNOSIS — H4312 Vitreous hemorrhage, left eye: Secondary | ICD-10-CM | POA: Diagnosis not present

## 2016-01-21 DIAGNOSIS — E113592 Type 2 diabetes mellitus with proliferative diabetic retinopathy without macular edema, left eye: Secondary | ICD-10-CM | POA: Diagnosis not present

## 2016-01-21 DIAGNOSIS — E113551 Type 2 diabetes mellitus with stable proliferative diabetic retinopathy, right eye: Secondary | ICD-10-CM | POA: Diagnosis not present

## 2016-01-23 DIAGNOSIS — E113592 Type 2 diabetes mellitus with proliferative diabetic retinopathy without macular edema, left eye: Secondary | ICD-10-CM | POA: Diagnosis not present

## 2016-02-11 ENCOUNTER — Encounter: Payer: Self-pay | Admitting: Neurology

## 2016-02-11 ENCOUNTER — Ambulatory Visit (INDEPENDENT_AMBULATORY_CARE_PROVIDER_SITE_OTHER): Payer: Federal, State, Local not specified - PPO | Admitting: Neurology

## 2016-02-11 VITALS — BP 127/78 | HR 79 | Ht 66.0 in | Wt 239.4 lb

## 2016-02-11 DIAGNOSIS — I6529 Occlusion and stenosis of unspecified carotid artery: Secondary | ICD-10-CM | POA: Diagnosis not present

## 2016-02-11 DIAGNOSIS — I69359 Hemiplegia and hemiparesis following cerebral infarction affecting unspecified side: Secondary | ICD-10-CM

## 2016-02-11 NOTE — Patient Instructions (Signed)
I had a long d/w patient about her remote stroke, risk for recurrent stroke/TIAs, personally independently reviewed imaging studies and stroke evaluation results and answered questions.Continue aspirin 325 mg daily  for secondary stroke prevention and maintain strict control of hypertension with blood pressure goal below 130/90, diabetes with hemoglobin A1c goal below 6.5% and lipids with LDL cholesterol goal below 70 mg/dL. I also advised the patient to eat a healthy diet with plenty of whole grains, cereals, fruits and vegetables, exercise regularly and maintain ideal body weight. Check follow-up carotid and transcranial Doppler studies. Continue follow-up with Dr. Brett Fairy for sleep related issues. No routine scheduled follow-up in the future with me is necessary as it has been more than 6 years since her stroke

## 2016-02-11 NOTE — Progress Notes (Signed)
PATIENT: Tina [name] DOB: 12-03-1970  REASON FOR VISIT: follow up HISTORY FROM: patient  HISTORY OF PRESENT ILLNESS:  Office visit 07/31/2015 : Miss Tina [name] is a 46 year old female with a history of stroke and tension-type headaches. She returns today for follow-up. She states that she continues to have headaches. She describes it as if someone "blew air into her head." She states that occasionally this is followed by sharp pain behind the left eye. She states that sometimes the headaches can occur in the morning or the evening. She reports she is noticed that the right side gets weak at times. She states that she has noticed that after every single surgery. The patient has had a total of 3 surgeries due to hernias. She reports that it usually gets better. When she is weaker on the right she is able to function but describes it as if the right side "feels heavy." She also reports that she is having more trouble with her balance. She states since January she's had a total of 4 falls. She feels as if her legs give out. The patient remains on aspirin. Blood pressure is well controlled. She continues to work on her hemoglobin A1c. Her current level was 7.8. Denies any falls with the cholesterol. She is monitoring her diet and trying to exercise. She states that none of the symptoms are necessarily "new" but are slightly worse. She reports that she had a sleep study "years ago." She does report snoring and excessive daytime sleepiness. She returns today for an evaluation.   HISTORY 10/10/14: [name] is a 46 year old female with a history of stroke and tension-type headaches. She returns today for follow-up. The patient continues to take aspirin for stroke prevention. Denies any significant bruising or bleeding. The patient has maintained good control of her blood pressure. The patient does state that her hemoglobin A1c was elevated at 8.7. She states that this was contributed to a flareup of gout  which caused her significant discomfort. She states that she has another appointment next month for repeat lab work. The patient denies any strokelike symptoms. She continues to have headaches. However she does not want to try anything daily. She states that her headache tends to present differently. Occasionally she'll have left-sided pain with a sharp stabbing pain behind the eye. At other times she'll have pain in the back of the head in the neck. She also states that sometimes the pain is in the frontal region. When she does have a headache she uses the tramadol. If the headache is severe she can take tizanidine and her headache resolves. She denies any new medical history is she returns today for an evaluation  HISTORY 04/12/14; Miss Tina [name] is a 46 year old female with a history of stroke and tension-type headaches. She returns today for follow-up. She continues taking aspirin for stroke prevention. She states her blood pressure has been well controlled. She states she recently had blood work with her primary care. Unsure what her cholesterol was. She states her hemoglobin A1c has recently been elevated but this is due to some complications with a hernia repair. He states that she's had several surgeries for hernia complications. She recently developed a fluid pocket as well as an infection and had repair on antibiotics. She states her blood sugars are finally starting to get back to normal. The patient states that she continues to have tension type headaches. She will have approximately 3 a week. She states that the pressure sensation feels like a  balloon blown up in her head and then releasing. She states that this can last for several minutes and then resolves. She states that she will usually have 1-2 headaches that involve sharp pain behind the left eye. She states this will call some photophobia and she'll have to delay in a dark room to get rid of the headache. He denies any strokelike symptoms.  Although she does state when she had her surgery she did develop right-sided weakness after both surgeries and had to participate in physical therapy. She returns today for an evaluation. Update 02/11/2016 ; she returns for follow-up after last visit 6 months ago with Tina Givens, NP. She states she continues to do well without recurrent stroke or TIA symptoms now since her left pontine infarct in June 2011. She still has some residual right-sided weakness and fatigue and stamina issues. After she has walked for 15 minutes she feels her right side is dragging. She's had no recent falls or injuries. She is tolerating aspirin well without bleeding or bruising. She does complain of some muscle aches and pains. She states she is working hard on her diabetes and control is improving but last hemoglobin A1c being down to 7.3 checked 3 months ago. She states her blood pressure is doing quite well and today it is 127/78. She remains on Zanaflex as well as Neurontin seemed to be helping her pain and spasticity. Her headaches seem quite well controlled and are not bothersome at the present time. She does have daytime sleepiness and tiredness but does follow up with Dr. Roddie [name] for the same. She has not had carotid ultrasound at studies checked for now several years. She has no new neurovascular symptoms. REVIEW OF SYSTEMS: Out of a complete 14 system review of symptoms, the patient complains only of the following symptoms, and all other reviewed systems are negative. Ringing in the ears, eye itching, eye pain, shortness of breath, chest tightness, chest pain, leg swelling, abdominal pain, constipation, diarrhea, nausea, restless leg, frequent waking, daytime sleepiness, difficulty urinating, joint pain and swelling, back pain, aching muscles, muscle cramps, walking difficulty, neck pain and stiffness, rash, itching, headache, numbness, anemia, weakness, facial drooping, confusion, depression, nervousness and  anxiety  ALLERGIES: Allergies  Allergen Reactions  . Contrast Media [Iodinated Diagnostic Agents] Other (See Comments)    Difficulty breathing  . Iohexol Hives, Nausea And Vomiting and Swelling     Desc: Magnevist-gadolinium-difficulty breathing, throat swelling   . Midazolam Hcl Anaphylaxis    Difficulty breathing  . Shellfish Allergy Anaphylaxis  . Metformin And Related Diarrhea and Nausea And Vomiting  . Other Itching    Patient is allergic to all nuts except peanuts.   . Avandia [Rosiglitazone Maleate] Hives and Other (See Comments)  . Geodon [Ziprasidone] Other (See Comments)    UNKNOWN  . Kiwi Extract Itching and Swelling  . Latex Itching    HOME MEDICATIONS: Outpatient Medications Prior to Visit  Medication Sig Dispense Refill  . allopurinol (ZYLOPRIM) 100 MG tablet Take 100 mg by mouth every evening.    Marland Kitchen amLODipine (NORVASC) 10 MG tablet Take 1 tablet (10 mg total) by mouth daily. 30 tablet 0  . Ascorbic Acid (VITAMIN C PO) Take 1 tablet by mouth daily.    Marland Kitchen aspirin 325 MG tablet Take 325 mg by mouth at bedtime.     . colchicine 0.6 MG tablet Take 0.6 mg by mouth daily as needed (For gout flare-up.).   1  . Cyanocobalamin (VITAMIN B-12 PO) Take 1  tablet by mouth daily.    . furosemide (LASIX) 40 MG tablet Take 20-40 mg by mouth daily as needed for fluid or edema.     . gabapentin (NEURONTIN) 600 MG tablet Take 600-1,200 mg by mouth See admin instructions. Take 2 tablets every morning, 1 tablet at noon and 2 tablets every evening    . hydrochlorothiazide (HYDRODIURIL) 25 MG tablet Take 1 tablet (25 mg total) by mouth daily. 6 tablet 0  . Insulin Glargine (BASAGLAR KWIKPEN) 100 UNIT/ML SOPN Inject 10 Units into the skin 2 (two) times daily.    . Liraglutide (VICTOZA) 18 MG/3ML SOPN Inject 1.2 mg into the skin daily.     Marland Kitchen NOVOLOG FLEXPEN 100 UNIT/ML FlexPen Inject 5-12 Units into the skin 3 (three) times daily with meals. Sliding scale  99  . ondansetron (ZOFRAN) 4 MG  tablet Take 1 tablet (4 mg total) by mouth every 6 (six) hours as needed for nausea. 20 tablet 0  . ONE TOUCH ULTRA TEST test strip     . rOPINIRole (REQUIP) 0.5 MG tablet Take 1 tablet (0.5 mg total) by mouth at bedtime. 90 tablet 0  . tiZANidine (ZANAFLEX) 2 MG tablet Take 1 tablet at bedtime daily. Can take 1/2 tablet in the AM PRN. 45 tablet 3  . traMADol (ULTRAM) 50 MG tablet Take 1 tablet (50 mg total) by mouth 2 (two) times daily as needed for moderate pain or severe pain. 60 tablet 5  . hydrOXYzine (ATARAX/VISTARIL) 25 MG tablet Take 1 tablet (25 mg total) by mouth every 6 (six) hours. (Patient not taking: Reported on 02/11/2016) 12 tablet 0  . amoxicillin (AMOXIL) 875 MG tablet Take 875 mg by mouth 2 (two) times daily.    . clindamycin (CLEOCIN) 300 MG capsule Take 1 capsule (300 mg total) by mouth 3 (three) times daily. (Patient not taking: Reported on 02/11/2016) 21 capsule 0  . ibuprofen (ADVIL,MOTRIN) 800 MG tablet Take 800 mg by mouth every 8 (eight) hours as needed for pain.     No facility-administered medications prior to visit.     PAST MEDICAL HISTORY: Past Medical History:  Diagnosis Date  . Anemia    DURING MENSES--HAS HEAVY BLEEDING WITH PERODS  . Anxiety   . Back pain, chronic    "ongoing"  . Blood transfusion    IN 2012  AFTER C -SECTION  . Cerebral thrombosis with cerebral infarction [facility]) JUNE 2011   RIGHT SIDED WEAKNESS ( ARM AND LEG ) AND SPASMS-remains with slight weakness and vertigo.  . Depression   . Diabetes mellitus   . Diabetic neuropathy ([place])    BOTH FEET --COMES AND GOES  . Endometriosis   . Headache(784.0)    MIGRAINES--NOT REALLY HEADACHE-MORE LIKE PRESSURE SENSATION IN HEAD-FEELS DIZZIY AND  FAINT AS THE PRESSURE RESOLVES  . Hernia, incisional, RLQ, s/p lap repair Sep 2013 09/02/2011  . Hx of migraines 10/19/2011  . Hypertension   . Leg pain, right    "like bad Charley horse"  . Panic disorder without agoraphobia   . Rash    HANDS, ARMS  --STATES HX OF RASH EVER SINCE CHILDBIRTH/PREGNANCY.  STATES THE RASH OFTEN OCCURS WHEN SHE IS REALLY STRESSED."goes and comes-presently left ring finger"  . Restless leg syndrome    DIAGNOSED BY SLEEP STUDY - PT TOLD SHE DID NOT HAVE SLEEP APNEA  . Right rotator cuff tear    PAIN IN RIGHT SHOULDER  . SBO (small bowel obstruction) 06/03/2012  . Spastic hemiplegia affecting dominant  side ([place])   . Stroke ([place])   . Ventral hernia    RIGHT LOWER QUADRANT-CAUSING SOME PAIN    PAST SURGICAL HISTORY: Past Surgical History:  Procedure Laterality Date  . APPLICATION OF WOUND VAC N/A 05/25/2015   Procedure: APPLICATION OF WOUND VAC;  Surgeon: Michael Boston, MD;  Location: [facility];  Service: General;  Laterality: N/A;  . CESAREAN SECTION  2012  . DIAGNOSTIC LAPAROSCOPY    . ESOPHAGOGASTRODUODENOSCOPY N/A 06/03/2012   Procedure: ESOPHAGOGASTRODUODENOSCOPY (EGD);  Surgeon: [name] Craver, MD;  Location: [facility] ENDOSCOPY;  Service: Endoscopy;  Laterality: N/A;  . EXCISION MASS ABDOMINAL N/A 05/25/2015   Procedure: ABDOMINAL WALL EXPLORATION EXCISION OF SEROMA REMOVAL OF REDUNDANT SKIN ;  Surgeon: Michael Boston, MD;  Location: [facility];  Service: General;  Laterality: N/A;  . EYE SURGERY     Eye laser for vessel hemorrhaging  . HERNIA REPAIR  10/03/11   ventral hernia repair  . INSERTION OF MESH N/A 02/04/2013   Procedure: INSERTION OF MESH;  Surgeon: Adin Hector, MD;  Location: [facility];  Service: General;  Laterality: N/A;  . UMBILICAL HERNIA REPAIR N/A 02/04/2013   Procedure: LAPAROSCOPIC ventral wall hernia repair LAPAROSCOPIC LYSIS OF ADHESIONS laparoscopic exploration of abdomen ;  Surgeon: Adin Hector, MD;  Location: [facility];  Service: General;  Laterality: N/A;  . URETER REVISION     Bilateral "twisted"  . UTERINE FIBROID SURGERY     2 SURGERIES FOR FIBROIDS  . VENTRAL HERNIA REPAIR  10/03/2011   Procedure: LAPAROSCOPIC VENTRAL HERNIA;  Surgeon: Adin Hector, MD;  Location: [facility];  Service:  General;  Laterality: N/A;    FAMILY HISTORY: Family History  Problem Relation Age of Onset  . Diabetes Father   . Kidney disease Father   . Diabetes Maternal Grandmother   . Hyperlipidemia Paternal Grandmother   . Stroke Paternal Grandmother   . Other Neg Hx     SOCIAL HISTORY: Social History   Social History  . Marital status: Married    Spouse name: N/A  . Number of children: 2  . Years of education: BA degree   Occupational History  .  Unemployed   Social History Main Topics  . Smoking status: Never Smoker  . Smokeless tobacco: Never Used  . Alcohol use No  . Drug use: No  . Sexual activity: Yes    Birth control/ protection: None   Other Topics Concern  . Not on file   Social History Narrative   Patient is married with 2 children.   Patient is right handed.   Patient has her  BA degree.   Patient drinks 1 cup daily.      PHYSICAL EXAM  Vitals:   02/11/16 1013  BP: 127/78  Pulse: 79  Weight: 239 lb 6.4 oz (108.6 kg)  Height: 5\' 6"  (1.676 m)   Body mass index is 38.64 kg/m.  Generalized: obese young african [place] lady not in distress. . Afebrile. Head is nontraumatic. Neck is supple without bruit.    Cardiac exam no murmur or gallop. Lungs are clear to auscultation. Distal pulses are well felt. Neurological examination  Mentation: Alert oriented to time, place, history taking. Follows all commands speech and language fluent Cranial nerve II-XII: Pupils were equal round reactive to light. Extraocular movements were full, visual field were full on confrontational test. Facial sensation and strength were normal. Uvula tongue midline. Head turning and shoulder shrug  were normal and symmetric. Motor: The motor testing reveals  5 over 5 strength of all 4 extremities. Good symmetric motor tone is noted throughout.  Sensory: Sensory testing is intact to soft touch on all 4 extremities. No evidence of extinction is noted.  Coordination: Cerebellar testing  reveals good finger-nose-finger and heel-to-shin bilaterally.  Gait and station: Gait is normal. Tandem gait is unsteady. Romberg is negative. No drift is seen.  Reflexes: Deep tendon reflexes are symmetric and normal bilaterally.   DIAGNOSTIC DATA (LABS, IMAGING, TESTING) - I reviewed patient records, labs, notes, testing and imaging myself where available.  Lab Results  Component Value Date   WBC 7.0 12/12/2015   HGB 10.8 (L) 12/12/2015   HCT 33.6 (L) 12/12/2015   MCV 77.4 (L) 12/12/2015   PLT 380 12/12/2015      Component Value Date/Time   NA 139 12/12/2015 0820   K 3.9 12/12/2015 0820   CL 108 12/12/2015 0820   CO2 23 12/12/2015 0820   GLUCOSE 204 (H) 12/12/2015 0820   BUN 11 12/12/2015 0820   CREATININE 0.84 12/12/2015 0820   CALCIUM 8.9 12/12/2015 0820   PROT 6.9 02/02/2015 1936   ALBUMIN 3.4 (L) 02/02/2015 1936   AST 17 02/02/2015 1936   ALT 16 02/02/2015 1936   ALKPHOS 46 02/02/2015 1936   BILITOT 0.3 02/02/2015 1936   GFRNONAA >60 12/12/2015 0820   GFRAA >60 12/12/2015 0820    Lab Results  Component Value Date   HGBA1C 7.5 (H) 05/18/2015   Lab Results  Component Value Date   VITAMINB12 289 06/16/2009   Lab Results  Component Value Date   TSH 1.045 06/03/2012      ASSESSMENT AND PLAN 46 y.o. year old female  has a past medical history of Anemia; Anxiety; Back pain, chronic; Blood transfusion; Cerebral thrombosis with cerebral infarction [facility]) (JUNE 2011); Depression; Diabetes mellitus; Diabetic neuropathy ([place]); Endometriosis; Headache(784.0); Hernia, incisional, RLQ, s/p lap repair Sep 2013 (09/02/2011); migraines (10/19/2011); Hypertension; Leg pain, right; Panic disorder without agoraphobia; Rash; Restless leg syndrome; Right rotator cuff tear; SBO (small bowel obstruction) (06/03/2012); Spastic hemiplegia affecting dominant side ([place]); Stroke [facility]); and Ventral hernia. here with:  1. History of Left [place] stroke June 2011 from small vessel diseaese with  multiple vascular risk for hypertension, hyperlipidemia, diabetes and obesity 2. Tension type headaches now controlled 3 Abnormality of gait post stroke 4. Snoring 5. Daytime sleepiness  I had a long d/w patient about her remote stroke, risk for recurrent stroke/TIAs, personally independently reviewed imaging studies and stroke evaluation results and answered questions.Continue aspirin 325 mg daily  for secondary stroke prevention and maintain strict control of hypertension with blood pressure goal below 130/90, diabetes with hemoglobin A1c goal below 6.5% and lipids with LDL cholesterol goal below 70 mg/dL. She seems to be doing quite well from a stroke but only mild gait imbalance and fatigue related issues. I also advised the patient to eat a healthy diet with plenty of whole grains, cereals, fruits and vegetables, exercise regularly and maintain ideal body weight. Check follow-up carotid and transcranial Doppler studies. Continue follow-up with Dr. Brett Fairy for sleep related issues. Greater than 50% time during this 25 minute visit was spent on counseling and coordination of care about stroke risk, prevention and treatment No routine scheduled follow-up in the future with me is necessary as it has been more than 6 years since her stroke   Antony Contras, MD 02/11/2016, 10:41 AM [facility] [address], [place], [place] [address] [phone]

## 2016-02-21 ENCOUNTER — Ambulatory Visit (INDEPENDENT_AMBULATORY_CARE_PROVIDER_SITE_OTHER): Payer: Federal, State, Local not specified - PPO

## 2016-02-21 DIAGNOSIS — I69359 Hemiplegia and hemiparesis following cerebral infarction affecting unspecified side: Secondary | ICD-10-CM

## 2016-02-21 DIAGNOSIS — I6529 Occlusion and stenosis of unspecified carotid artery: Secondary | ICD-10-CM

## 2016-02-21 DIAGNOSIS — Z0289 Encounter for other administrative examinations: Secondary | ICD-10-CM

## 2016-02-26 DIAGNOSIS — I10 Essential (primary) hypertension: Secondary | ICD-10-CM | POA: Diagnosis not present

## 2016-02-26 DIAGNOSIS — E118 Type 2 diabetes mellitus with unspecified complications: Secondary | ICD-10-CM | POA: Diagnosis not present

## 2016-03-03 ENCOUNTER — Telehealth: Payer: Self-pay

## 2016-03-03 NOTE — Telephone Encounter (Signed)
-----   Message from Garvin Fila, MD sent at 03/01/2016 10:15 PM EST ----- Kindly inform patient that TCD study shows mild hardening of arteries in brain.No severe narrowing.

## 2016-03-03 NOTE — Telephone Encounter (Signed)
LEft vm for patient to call about results.

## 2016-03-05 DIAGNOSIS — R109 Unspecified abdominal pain: Secondary | ICD-10-CM | POA: Diagnosis not present

## 2016-03-05 DIAGNOSIS — E118 Type 2 diabetes mellitus with unspecified complications: Secondary | ICD-10-CM | POA: Diagnosis not present

## 2016-03-05 DIAGNOSIS — G629 Polyneuropathy, unspecified: Secondary | ICD-10-CM | POA: Diagnosis not present

## 2016-03-05 NOTE — Telephone Encounter (Signed)
Left vm for patient to call back about test results.

## 2016-03-06 NOTE — Telephone Encounter (Signed)
Patient called office returning nurse's call.  Please call °

## 2016-03-06 NOTE — Telephone Encounter (Signed)
Left 3rd message on cell phone to call back about carotid doppler results

## 2016-03-06 NOTE — Telephone Encounter (Signed)
LEft 3rd message to call back about TCD results.

## 2016-03-07 NOTE — Telephone Encounter (Addendum)
Pt returned RN's call, she was advised RN is out of the office today and will be back on Monday

## 2016-03-10 NOTE — Telephone Encounter (Signed)
Rn call patient that her carotid doppler was negative and transcranial doppler shows mild hardening of arteries, no severe narrowing,nothing to worry about. Pt verbalized understanding.

## 2016-04-03 DIAGNOSIS — E118 Type 2 diabetes mellitus with unspecified complications: Secondary | ICD-10-CM | POA: Diagnosis not present

## 2016-04-10 DIAGNOSIS — R109 Unspecified abdominal pain: Secondary | ICD-10-CM | POA: Diagnosis not present

## 2016-04-10 DIAGNOSIS — E118 Type 2 diabetes mellitus with unspecified complications: Secondary | ICD-10-CM | POA: Diagnosis not present

## 2016-04-10 DIAGNOSIS — G629 Polyneuropathy, unspecified: Secondary | ICD-10-CM | POA: Diagnosis not present

## 2016-04-23 ENCOUNTER — Emergency Department (HOSPITAL_BASED_OUTPATIENT_CLINIC_OR_DEPARTMENT_OTHER): Payer: Medicare Other

## 2016-04-23 ENCOUNTER — Encounter (HOSPITAL_BASED_OUTPATIENT_CLINIC_OR_DEPARTMENT_OTHER): Payer: Self-pay | Admitting: Emergency Medicine

## 2016-04-23 ENCOUNTER — Emergency Department (HOSPITAL_BASED_OUTPATIENT_CLINIC_OR_DEPARTMENT_OTHER)
Admission: EM | Admit: 2016-04-23 | Discharge: 2016-04-23 | Disposition: A | Discharge status: Home / Self Care | Payer: Medicare Other | Attending: Emergency Medicine | Admitting: Emergency Medicine

## 2016-04-23 DIAGNOSIS — W01198A Fall on same level from slipping, tripping and stumbling with subsequent striking against other object, initial encounter: Secondary | ICD-10-CM | POA: Diagnosis not present

## 2016-04-23 DIAGNOSIS — I10 Essential (primary) hypertension: Secondary | ICD-10-CM | POA: Diagnosis not present

## 2016-04-23 DIAGNOSIS — Y9301 Activity, walking, marching and hiking: Secondary | ICD-10-CM | POA: Insufficient documentation

## 2016-04-23 DIAGNOSIS — Y999 Unspecified external cause status: Secondary | ICD-10-CM | POA: Diagnosis not present

## 2016-04-23 DIAGNOSIS — Z7982 Long term (current) use of aspirin: Secondary | ICD-10-CM | POA: Diagnosis not present

## 2016-04-23 DIAGNOSIS — Z79899 Other long term (current) drug therapy: Secondary | ICD-10-CM | POA: Diagnosis not present

## 2016-04-23 DIAGNOSIS — T148XXA Other injury of unspecified body region, initial encounter: Secondary | ICD-10-CM | POA: Diagnosis not present

## 2016-04-23 DIAGNOSIS — S8991XA Unspecified injury of right lower leg, initial encounter: Secondary | ICD-10-CM | POA: Diagnosis not present

## 2016-04-23 DIAGNOSIS — S01511A Laceration without foreign body of lip, initial encounter: Secondary | ICD-10-CM | POA: Insufficient documentation

## 2016-04-23 DIAGNOSIS — Y929 Unspecified place or not applicable: Secondary | ICD-10-CM | POA: Diagnosis not present

## 2016-04-23 DIAGNOSIS — R531 Weakness: Secondary | ICD-10-CM | POA: Diagnosis not present

## 2016-04-23 DIAGNOSIS — Z794 Long term (current) use of insulin: Secondary | ICD-10-CM | POA: Diagnosis not present

## 2016-04-23 DIAGNOSIS — S80211A Abrasion, right knee, initial encounter: Secondary | ICD-10-CM | POA: Insufficient documentation

## 2016-04-23 DIAGNOSIS — T07XXXA Unspecified multiple injuries, initial encounter: Secondary | ICD-10-CM | POA: Diagnosis not present

## 2016-04-23 DIAGNOSIS — Z043 Encounter for examination and observation following other accident: Secondary | ICD-10-CM | POA: Diagnosis not present

## 2016-04-23 DIAGNOSIS — E119 Type 2 diabetes mellitus without complications: Secondary | ICD-10-CM | POA: Insufficient documentation

## 2016-04-23 DIAGNOSIS — S0990XA Unspecified injury of head, initial encounter: Secondary | ICD-10-CM

## 2016-04-23 DIAGNOSIS — M79671 Pain in right foot: Secondary | ICD-10-CM | POA: Diagnosis not present

## 2016-04-23 DIAGNOSIS — W19XXXA Unspecified fall, initial encounter: Secondary | ICD-10-CM

## 2016-04-23 DIAGNOSIS — S0993XA Unspecified injury of face, initial encounter: Secondary | ICD-10-CM | POA: Diagnosis not present

## 2016-04-23 DIAGNOSIS — R51 Headache: Secondary | ICD-10-CM | POA: Diagnosis not present

## 2016-04-23 DIAGNOSIS — S99921A Unspecified injury of right foot, initial encounter: Secondary | ICD-10-CM | POA: Diagnosis not present

## 2016-04-23 DIAGNOSIS — R220 Localized swelling, mass and lump, head: Secondary | ICD-10-CM | POA: Diagnosis not present

## 2016-04-23 LAB — CBG MONITORING, ED: GLUCOSE-CAPILLARY: 155 mg/dL — AB (ref 65–99)

## 2016-04-23 MED ORDER — IBUPROFEN 800 MG PO TABS
800.0000 mg | ORAL_TABLET | Freq: Once | ORAL | Status: AC
  Administered 2016-04-23: 800 mg via ORAL
  Filled 2016-04-23: qty 1

## 2016-04-23 NOTE — Discharge Instructions (Signed)
As discussed, please follow-up with your primary care provider. Naproxen or ibuprofen over-the-counter, rice protocol, rest, elevate, ice and Ace bandage.  Return to the emergency department if you experience increased dizziness, nausea, vomiting, bad headache, confusion or any other new concerning symptoms.

## 2016-04-23 NOTE — ED Triage Notes (Signed)
Per EMS patient walking and tripped and fell hitting head into building.  Denies LOC, sycopal episode.  Reports injury to right side of face, right knee.

## 2016-04-23 NOTE — ED Notes (Signed)
Wears glasses daily. Wore glasses during visual acuity.

## 2016-04-23 NOTE — ED Notes (Signed)
CBG 155 

## 2016-04-23 NOTE — ED Provider Notes (Signed)
[place] DEPT [facility] Provider Note   CSN: [id number] Arrival date & time: 04/23/16  0945     History   Chief Complaint Chief Complaint  Patient presents with  . Fall    HPI Tina [name] is a 46 y.o. female presenting via EMS after a trip and fall which she hit the right side of her face onto the wall of the building and fell onto her right knee. She has swelling and pain to the right side of her face and a throbbing headache on that side. She also reports that her vision appears more blurry on the right. She has right knee pain and a mild superficial abrasion. She denies vomiting, loss of consciousness, dizziness.  HPI  Past Medical History:  Diagnosis Date  . Anemia    DURING MENSES--HAS HEAVY BLEEDING WITH PERODS  . Anxiety   . Back pain, chronic    "ongoing"  . Blood transfusion    IN 2012  AFTER C -SECTION  . Cerebral thrombosis with cerebral infarction [facility]) JUNE 2011   RIGHT SIDED WEAKNESS ( ARM AND LEG ) AND SPASMS-remains with slight weakness and vertigo.  . Depression   . Diabetes mellitus   . Diabetic neuropathy ([place])    BOTH FEET --COMES AND GOES  . Endometriosis   . Headache(784.0)    MIGRAINES--NOT REALLY HEADACHE-MORE LIKE PRESSURE SENSATION IN HEAD-FEELS DIZZIY AND  FAINT AS THE PRESSURE RESOLVES  . Hernia, incisional, RLQ, s/p lap repair Sep 2013 09/02/2011  . Hx of migraines 10/19/2011  . Hypertension   . Leg pain, right    "like bad Charley horse"  . Panic disorder without agoraphobia   . Rash    HANDS, ARMS --STATES HX OF RASH EVER SINCE CHILDBIRTH/PREGNANCY.  STATES THE RASH OFTEN OCCURS WHEN SHE IS REALLY STRESSED."goes and comes-presently left ring finger"  . Restless leg syndrome    DIAGNOSED BY SLEEP STUDY - PT TOLD SHE DID NOT HAVE SLEEP APNEA  . Right rotator cuff tear    PAIN IN RIGHT SHOULDER  . SBO (small bowel obstruction) 06/03/2012  . Spastic hemiplegia affecting dominant side ([place])   . Stroke ([place])   . Ventral hernia    RIGHT LOWER QUADRANT-CAUSING SOME PAIN    Patient Active Problem List   Diagnosis Date Noted  . OSA (obstructive sleep apnea) 10/17/2015  . Periodic limb movement disorder (PLMD) 10/17/2015  . Essential hypertension 05/26/2015  . Type 2 diabetes mellitus with hyperglycemia, with long-term current use of insulin ([place]) 05/26/2015  . Abdominal wall mass of right lower quadrant 05/25/2015  . Hemiparesis affecting right side as late effect of stroke ([place]) 11/09/2014  . Abdominal wall seroma ([place])   . Sepsis due to undetermined organism ([place]) 03/06/2014  . Nausea vomiting and diarrhea 03/06/2014  . Sepsis ([place]) 03/06/2014  . Tension headache 09/01/2013  . Nausea with vomiting ?Gastroparesis? 03/21/2013  . Spastic hemiplegia affecting dominant side ([place]) 02/16/2013  . Recurrent ventral incisional hernia s/p closure/repair w mesh 02/04/2013 01/26/2013  . Constipation, chronic 01/26/2013  . Abdominal pain, chronic, right lower quadrant 06/03/2012  . History of CVA (cerebrovascular accident) 06/03/2012  . HTN (hypertension) 06/03/2012  . Diabetes mellitus type II, uncontrolled ([place]) 10/16/2011  . Obesity (BMI 30-39.9) 09/02/2011  . Rotator cuff tear 08/25/2011  . Sciatica 06/10/2011  . Paresthesias in right hand 06/10/2011  . Lumbago 05/09/2011  . Paresthesias 05/09/2011    Past Surgical History:  Procedure Laterality Date  . APPLICATION OF WOUND VAC N/A 05/25/2015  Procedure: APPLICATION OF WOUND VAC;  Surgeon: Michael Boston, MD;  Location: [facility]S;  Service: General;  Laterality: N/A;  . CESAREAN SECTION  2012  . DIAGNOSTIC LAPAROSCOPY    . ESOPHAGOGASTRODUODENOSCOPY N/A 06/03/2012   Procedure: ESOPHAGOGASTRODUODENOSCOPY (EGD);  Surgeon: [name] Craver, MD;  Location: [facility] ENDOSCOPY;  Service: Endoscopy;  Laterality: N/A;  . EXCISION MASS ABDOMINAL N/A 05/25/2015   Procedure: ABDOMINAL WALL EXPLORATION EXCISION OF SEROMA REMOVAL OF REDUNDANT SKIN ;  Surgeon: Michael Boston, MD;  Location: [facility]  ORS;  Service: General;  Laterality: N/A;  . EYE SURGERY     Eye laser for vessel hemorrhaging  . HERNIA REPAIR  10/03/11   ventral hernia repair  . INSERTION OF MESH N/A 02/04/2013   Procedure: INSERTION OF MESH;  Surgeon: Adin Hector, MD;  Location: [facility];  Service: General;  Laterality: N/A;  . UMBILICAL HERNIA REPAIR N/A 02/04/2013   Procedure: LAPAROSCOPIC ventral wall hernia repair LAPAROSCOPIC LYSIS OF ADHESIONS laparoscopic exploration of abdomen ;  Surgeon: Adin Hector, MD;  Location: [facility]S;  Service: General;  Laterality: N/A;  . URETER REVISION     Bilateral "twisted"  . UTERINE FIBROID SURGERY     2 SURGERIES FOR FIBROIDS  . VENTRAL HERNIA REPAIR  10/03/2011   Procedure: LAPAROSCOPIC VENTRAL HERNIA;  Surgeon: Adin Hector, MD;  Location: [facility];  Service: General;  Laterality: N/A;    OB History    Gravida Para Term Preterm AB Living   1 1 0 1 0 1   SAB TAB Ectopic Multiple Live Births   0 0 0 0         Home Medications    Prior to Admission medications   Medication Sig Start Date End Date Taking? Authorizing Provider  allopurinol (ZYLOPRIM) 100 MG tablet Take 100 mg by mouth every evening. 03/13/14   Historical Provider, MD  amLODipine (NORVASC) 10 MG tablet Take 1 tablet (10 mg total) by mouth daily. 11/18/15   Nicole Pisciotta, PA-C  Ascorbic Acid (VITAMIN C PO) Take 1 tablet by mouth daily.    Historical Provider, MD  aspirin 325 MG tablet Take 325 mg by mouth at bedtime.     Historical Provider, MD  colchicine 0.6 MG tablet Take 0.6 mg by mouth daily as needed (For gout flare-up.).  02/14/14   Historical Provider, MD  Cyanocobalamin (VITAMIN B-12 PO) Take 1 tablet by mouth daily.    Historical Provider, MD  furosemide (LASIX) 40 MG tablet Take 20-40 mg by mouth daily as needed for fluid or edema.     Historical Provider, MD  gabapentin (NEURONTIN) 600 MG tablet Take 600-1,200 mg by mouth See admin instructions. Take 2 tablets every morning, 1 tablet at noon  and 2 tablets every evening    Historical Provider, MD  hydrochlorothiazide (HYDRODIURIL) 25 MG tablet Take 1 tablet (25 mg total) by mouth daily. 11/18/15   Nicole Pisciotta, PA-C  hydrOXYzine (ATARAX/VISTARIL) 25 MG tablet Take 1 tablet (25 mg total) by mouth every 6 (six) hours. Patient not taking: Reported on 02/11/2016 12/12/15   Isla Pence, MD  Insulin Glargine [facility]) 100 UNIT/ML SOPN Inject 10 Units into the skin 2 (two) times daily.    Historical Provider, MD  Liraglutide (VICTOZA) 18 MG/3ML SOPN Inject 1.2 mg into the skin daily.     Historical Provider, MD  NOVOLOG FLEXPEN 100 UNIT/ML FlexPen Inject 5-12 Units into the skin 3 (three) times daily with meals. Sliding scale 12/14/13   Historical Provider,  MD  ondansetron (ZOFRAN) 4 MG tablet Take 1 tablet (4 mg total) by mouth every 6 (six) hours as needed for nausea. 03/08/14   Shanker Kristeen Mans, MD  ONE TOUCH ULTRA TEST test strip  06/30/15   Historical Provider, MD  rOPINIRole (REQUIP) 0.5 MG tablet Take 1 tablet (0.5 mg total) by mouth at bedtime. 11/26/15   Asencion Partridge Dohmeier, MD  tiZANidine (ZANAFLEX) 2 MG tablet Take 1 tablet at bedtime daily. Can take 1/2 tablet in the AM PRN. 07/31/15   [name], NP  traMADol (ULTRAM) 50 MG tablet Take 1 tablet (50 mg total) by mouth 2 (two) times daily as needed for moderate pain or severe pain. 11/05/15   Bayard Hugger, NP    Family History Family History  Problem Relation Age of Onset  . Diabetes Father   . Kidney disease Father   . Diabetes Maternal Grandmother   . Hyperlipidemia Paternal Grandmother   . Stroke Paternal Grandmother   . Other Neg Hx     Social History Social History  Substance Use Topics  . Smoking status: Never Smoker  . Smokeless tobacco: Never Used  . Alcohol use No     Allergies   Contrast media [iodinated diagnostic agents]; Iohexol; Midazolam hcl; Shellfish allergy; Metformin and related; Other; Avandia [rosiglitazone maleate]; Geodon  [ziprasidone]; Kiwi extract; and Latex   Review of Systems Review of Systems  Constitutional: Negative for chills and fever.  HENT: Positive for facial swelling. Negative for ear pain, hearing loss, sore throat and tinnitus.   Eyes: Positive for visual disturbance. Negative for pain and redness.  Respiratory: Negative for cough, choking, chest tightness, shortness of breath, wheezing and stridor.   Cardiovascular: Negative for chest pain, palpitations and leg swelling.  Gastrointestinal: Positive for nausea. Negative for abdominal distention, abdominal pain and vomiting.  Genitourinary: Negative for dysuria and hematuria.  Musculoskeletal: Positive for gait problem. Negative for arthralgias, back pain, joint swelling, neck pain and neck stiffness.  Skin: Negative for color change, pallor and rash.  Neurological: Positive for weakness and headaches. Negative for dizziness, tremors, seizures, syncope, facial asymmetry, speech difficulty, light-headedness and numbness.       Patient has mild right-sided weakness at baseline from prior stroke.     Physical Exam Updated Vital Signs BP 137/71 (BP Location: Right Arm)   Pulse 81   Temp 98.4 F (36.9 C) (Oral)   Resp 17   Ht 5\' 6"  (1.676 m)   Wt 106.6 kg   LMP 03/20/2016 (Exact Date)   SpO2 100%   BMI 37.93 kg/m   Physical Exam  Constitutional: She is oriented to person, place, and time. She appears well-developed and well-nourished. No distress.  Afebrile, nontoxic-appearing, sitting comfortably in bed in no acute distress.  HENT:  Right Ear: External ear normal.  Left Ear: External ear normal.  Mouth/Throat: Oropharynx is clear and moist. No oropharyngeal exudate.  Swelling noted on the right side of the face. Superficial laceration to the inner upper lip mucosa.  Eyes: Conjunctivae and EOM are normal. Pupils are equal, round, and reactive to light. Right eye exhibits no discharge. Left eye exhibits no discharge. No scleral  icterus.  Neck: Normal range of motion. Neck supple.  Cardiovascular: Normal rate, regular rhythm, normal heart sounds and intact distal pulses.   No murmur heard. Pulmonary/Chest: Effort normal and breath sounds normal. No stridor. No respiratory distress. She has no wheezes. She has no rales. She exhibits no tenderness.  Abdominal: Soft. She exhibits no  distension and no mass. There is no tenderness. There is no rebound and no guarding.  Musculoskeletal: Normal range of motion. She exhibits tenderness. She exhibits no edema or deformity.  Tenderness palpation over the patella for range of motion of the knee stable to be negative anterior drawer. Tenderness on the dorsum of the foot and lateral malleolus, negative anterior drawer, stable ankle joint.  Neurological: She is alert and oriented to person, place, and time. No cranial nerve deficit or sensory deficit. She exhibits normal muscle tone. Coordination normal.  Neurologic Exam:   - Mental status: Patient is alert and cooperative. Fluent speech and words are clear. Coherent thought processes and insight is good. Patient is oriented x 4 to person, place, time and event.   - Cranial nerves:  CN III, IV, VI: pupils equally round, reactive to light both direct and conscensual and normal accommodation. Full extra-ocular movement. CN V: motor temporalis and masseter strength intact. CN VII : muscles of facial expression intact. CN X :  midline uvula. XI strength of sternocleidomastoid and trapezius muscles 5/5, XII: tongue is midline when protruded.  - Motor: No involuntary movements. Muscle tone and bulk normal throughout. Muscle strength is 5/5 in bilateral shoulder abduction, elbow flexion and extension, wrist flexion and extension, thumb opposition, grip, hip extension, flexion, leg flexion and extension, ankle dorsiflexion and plantar flexion.   - Sensory: Proprioception, light tough sensation intact in all extremities. Patient has baseline  decreased sensation on the right side she reports no change from baseline.  - Cerebellar: rapid alternating movements and point to point movement intact in upper and lower extremities. Patient is walking with a slight limp favoring the right.   Skin: Skin is warm and dry. She is not diaphoretic. No erythema. No pallor.  Mild superficial abrasion over the right knee  Psychiatric: She has a normal mood and affect. Her behavior is normal.  Nursing note and vitals reviewed.    ED Treatments / Results  Labs (all labs ordered are listed, but only abnormal results are displayed) Labs Reviewed  CBG MONITORING, ED - Abnormal; Notable for the following:       Result Value   Glucose-Capillary 155 (*)    All other components within normal limits    EKG  EKG Interpretation None       Radiology Dg Foot Complete Right  Result Date: 04/23/2016 CLINICAL DATA:  [place], pain. EXAM: RIGHT FOOT COMPLETE - 3+ VIEW COMPARISON:  None. FINDINGS: There is no evidence of fracture or dislocation. There is no evidence of erosive arthropathy. Plantar spur. Soft tissues are unremarkable. IMPRESSION: Negative. Electronically Signed   By: Staci Righter M.D.   On: 04/23/2016 10:54   Ct Maxillofacial Wo Contrast  Result Date: 04/23/2016 CLINICAL DATA:  [place] this morning, striking the right side of the face with pain and swelling. EXAM: CT MAXILLOFACIAL WITHOUT CONTRAST TECHNIQUE: Multidetector CT imaging of the maxillofacial structures was performed. Multiplanar CT image reconstructions were also generated. A small metallic BB was placed on the right temple in order to reliably differentiate right from left. COMPARISON:  None. FINDINGS: Osseous: No facial fracture. Orbits: Normal Sinuses: Clear Soft tissues: Mild swelling of the right cheek. Limited intracranial: Normal The patient does have dental and periodontal disease including radicular cysts most prominently at tooth 34 or 35, right mandibular molar. IMPRESSION:  No acute or traumatic finding other than mild soft tissue swelling of the right cheek. Dental and periodontal disease, most pronounced affecting right mandibular molar with  radicular cysts. Electronically Signed   By: Nelson Chimes M.D.   On: 04/23/2016 10:48   Dg Knee 3 Views Right  Result Date: 04/23/2016 CLINICAL DATA:  Status post fall today. EXAM: RIGHT KNEE - 3 VIEW COMPARISON:  None in PACs FINDINGS: The bones are subjectively adequately mineralized. The joint spaces are well maintained. There is mild beaking of the tibial spines. There is no acute fracture nor dislocation. There is no joint effusion. IMPRESSION: There is no acute bony abnormality of the right knee. Minimal osteoarthritic beaking of the tibial spines is observed. Electronically Signed   By: David  Martinique M.D.   On: 04/23/2016 10:52    Procedures Procedures (including critical care time)  Medications Ordered in ED Medications  ibuprofen (ADVIL,MOTRIN) tablet 800 mg (not administered)     Initial Impression / Assessment and Plan / ED Course  I have reviewed the triage vital signs and the nursing notes.  Pertinent labs & imaging results that were available during my care of the patient were reviewed by me and considered in my medical decision making (see chart for details).    Patient presents via EMS status post trip and fall with right sided facial swelling and pain, right knee pain, right foot pain.  Imaging of the maxillofacial, right knee, and right foot negative for any acute signs of injury/fractures  Patient's pain was managed in the ED she was provided with ice and refused any pain medication as she states she is already taking pain medications at home.  Reassuring exam, normal neuro exam. Patient vitals are stable and she appears safe for discharge.  On reassessment, patient had improved, her nausea had subsided and she was ready to go home. Successful PO trial.  Discharge with home pain management, rice  protocol, and close follow-up with PCP. Patient was discussed with Dr. Rogene Houston who agrees with assessment and plan. Discussed strict return precautions and advised to return to the emergency department if experiencing any new or worsening symptoms. Instructions were understood and patient agreed with discharge plan.   Final Clinical Impressions(s) / ED Diagnoses   Final diagnoses:  Fall  Minor head injury, initial encounter    New Prescriptions New Prescriptions   No medications on file     Emeline General, Hershal Coria 04/23/16 [place], MD 04/23/16 1541

## 2016-05-04 ENCOUNTER — Other Ambulatory Visit: Payer: Self-pay | Admitting: Registered Nurse

## 2016-05-05 ENCOUNTER — Encounter: Payer: Federal, State, Local not specified - PPO | Attending: Physical Medicine & Rehabilitation

## 2016-05-05 ENCOUNTER — Encounter: Payer: Self-pay | Admitting: Physical Medicine & Rehabilitation

## 2016-05-05 ENCOUNTER — Ambulatory Visit (HOSPITAL_BASED_OUTPATIENT_CLINIC_OR_DEPARTMENT_OTHER): Payer: Federal, State, Local not specified - PPO | Admitting: Physical Medicine & Rehabilitation

## 2016-05-05 VITALS — BP 136/84 | HR 88

## 2016-05-05 DIAGNOSIS — E1140 Type 2 diabetes mellitus with diabetic neuropathy, unspecified: Secondary | ICD-10-CM | POA: Diagnosis not present

## 2016-05-05 DIAGNOSIS — Z9889 Other specified postprocedural states: Secondary | ICD-10-CM | POA: Diagnosis not present

## 2016-05-05 DIAGNOSIS — D649 Anemia, unspecified: Secondary | ICD-10-CM | POA: Insufficient documentation

## 2016-05-05 DIAGNOSIS — G8111 Spastic hemiplegia affecting right dominant side: Secondary | ICD-10-CM | POA: Diagnosis not present

## 2016-05-05 DIAGNOSIS — Z841 Family history of disorders of kidney and ureter: Secondary | ICD-10-CM | POA: Insufficient documentation

## 2016-05-05 DIAGNOSIS — F411 Generalized anxiety disorder: Secondary | ICD-10-CM | POA: Diagnosis not present

## 2016-05-05 DIAGNOSIS — G8110 Spastic hemiplegia affecting unspecified side: Secondary | ICD-10-CM | POA: Diagnosis not present

## 2016-05-05 DIAGNOSIS — Z823 Family history of stroke: Secondary | ICD-10-CM | POA: Insufficient documentation

## 2016-05-05 DIAGNOSIS — S93491A Sprain of other ligament of right ankle, initial encounter: Secondary | ICD-10-CM | POA: Insufficient documentation

## 2016-05-05 DIAGNOSIS — Z833 Family history of diabetes mellitus: Secondary | ICD-10-CM | POA: Diagnosis not present

## 2016-05-05 DIAGNOSIS — I10 Essential (primary) hypertension: Secondary | ICD-10-CM | POA: Insufficient documentation

## 2016-05-05 DIAGNOSIS — Z79891 Long term (current) use of opiate analgesic: Secondary | ICD-10-CM | POA: Diagnosis not present

## 2016-05-05 DIAGNOSIS — E119 Type 2 diabetes mellitus without complications: Secondary | ICD-10-CM | POA: Diagnosis not present

## 2016-05-05 DIAGNOSIS — G43909 Migraine, unspecified, not intractable, without status migrainosus: Secondary | ICD-10-CM | POA: Insufficient documentation

## 2016-05-05 DIAGNOSIS — G8929 Other chronic pain: Secondary | ICD-10-CM | POA: Insufficient documentation

## 2016-05-05 DIAGNOSIS — F331 Major depressive disorder, recurrent, moderate: Secondary | ICD-10-CM | POA: Diagnosis not present

## 2016-05-05 DIAGNOSIS — M545 Low back pain: Secondary | ICD-10-CM | POA: Insufficient documentation

## 2016-05-05 DIAGNOSIS — I6529 Occlusion and stenosis of unspecified carotid artery: Secondary | ICD-10-CM

## 2016-05-05 MED ORDER — TRAMADOL HCL 50 MG PO TABS: 50.0000 mg | ORAL_TABLET | Freq: Two times a day (BID) | ORAL | 5 refills | Status: DC | PRN

## 2016-05-05 MED ORDER — TIZANIDINE HCL 2 MG PO TABS: ORAL_TABLET | ORAL | 3 refills | Status: DC

## 2016-05-05 NOTE — Patient Instructions (Signed)
Please use cam walker boot when Out of bed for the next 4 wks

## 2016-05-05 NOTE — Progress Notes (Signed)
Subjective:    Patient ID: Tina [name], female    DOB: 16-Oct-1970, 46 y.o.   MRN: [id number]  HPI Patient doing well up until fall on 04/23/2016. She fell in a parking lot against a brick wall. Injured the right side of her face, also hurt her right ankle and right knee. Was seen in the emergency department the same date. X-rays of the knee and ankle were negative. Did not require any sutures. Her right ankle has been sore ever since and has also contributed to 2 additional falls without any additional injuries.   History of left distal fibular fracture in 2015. She did have a twisting injury in 2012 as well. She was previously treated with a cam walker boot. She does still have that boot at home.  Patient continues to use tizanidine 1 tablet daily at bedtime and 1 half tablet during the day. Patient also continues to use tramadol 50 mg twice a day for back pain Pain Inventory Average Pain 6 Pain Right Now 8 My pain is sharp, burning, stabbing, tingling and aching  In the last 24 hours, has pain interfered with the following? General activity 7 Relation with others 6 Enjoyment of life 7 What TIME of day is your pain at its worst? daytime Sleep (in general) Fair  Pain is worse with: walking, bending, standing and some activites Pain improves with: heat/ice and medication Relief from Meds: .  Mobility walk without assistance ability to climb steps?  yes do you drive?  no  Function disabled: date disabled . I need assistance with the following:  bathing, meal prep and household duties  Neuro/Psych weakness numbness tingling spasms dizziness confusion depression anxiety  Prior Studies Any changes since last visit?  no  Physicians involved in your care Any changes since last visit?  no   Family History  Problem Relation Age of Onset  . Diabetes Father   . Kidney disease Father   . Diabetes Maternal Grandmother   . Hyperlipidemia Paternal Grandmother   .  Stroke Paternal Grandmother   . Other Neg Hx    Social History   Social History  . Marital status: Married    Spouse name: N/A  . Number of children: 2  . Years of education: BA degree   Occupational History  .  Unemployed   Social History Main Topics  . Smoking status: Never Smoker  . Smokeless tobacco: Never Used  . Alcohol use No  . Drug use: No  . Sexual activity: Yes    Birth control/ protection: None   Other Topics Concern  . None   Social History Narrative   Patient is married with 2 children.   Patient is right handed.   Patient has her  BA degree.   Patient drinks 1 cup daily.   Past Surgical History:  Procedure Laterality Date  . APPLICATION OF WOUND VAC N/A 05/25/2015   Procedure: APPLICATION OF WOUND VAC;  Surgeon: Michael Boston, MD;  Location: [facility];  Service: General;  Laterality: N/A;  . CESAREAN SECTION  2012  . DIAGNOSTIC LAPAROSCOPY    . ESOPHAGOGASTRODUODENOSCOPY N/A 06/03/2012   Procedure: ESOPHAGOGASTRODUODENOSCOPY (EGD);  Surgeon: [name] Craver, MD;  Location[facility] ENDOSCOPY;  Service: Endoscopy;  Laterality: N/A;  . EXCISION MASS ABDOMINAL N/A 05/25/2015   Procedure: ABDOMINAL WALL EXPLORATION EXCISION OF SEROMA REMOVAL OF REDUNDANT SKIN ;  Surgeon: Michael Boston, MD;  Location: [facility];  Service: General;  Laterality: N/A;  . EYE SURGERY  Eye laser for vessel hemorrhaging  . HERNIA REPAIR  10/03/11   ventral hernia repair  . INSERTION OF MESH N/A 02/04/2013   Procedure: INSERTION OF MESH;  Surgeon: Adin Hector, MD;  Location: [facility];  Service: General;  Laterality: N/A;  . UMBILICAL HERNIA REPAIR N/A 02/04/2013   Procedure: LAPAROSCOPIC ventral wall hernia repair LAPAROSCOPIC LYSIS OF ADHESIONS laparoscopic exploration of abdomen ;  Surgeon: Adin Hector, MD;  Location: [facility];  Service: General;  Laterality: N/A;  . URETER REVISION     Bilateral "twisted"  . UTERINE FIBROID SURGERY     2 SURGERIES FOR FIBROIDS  . VENTRAL HERNIA REPAIR   10/03/2011   Procedure: LAPAROSCOPIC VENTRAL HERNIA;  Surgeon: Adin Hector, MD;  Location: [facility];  Service: General;  Laterality: N/A;   Past Medical History:  Diagnosis Date  . Anemia    DURING MENSES--HAS HEAVY BLEEDING WITH PERODS  . Anxiety   . Back pain, chronic    "ongoing"  . Blood transfusion    IN 2012  AFTER C -SECTION  . Cerebral thrombosis with cerebral infarction [facility]) JUNE 2011   RIGHT SIDED WEAKNESS ( ARM AND LEG ) AND SPASMS-remains with slight weakness and vertigo.  . Depression   . Diabetes mellitus   . Diabetic neuropathy ([place])    BOTH FEET --COMES AND GOES  . Endometriosis   . Headache(784.0)    MIGRAINES--NOT REALLY HEADACHE-MORE LIKE PRESSURE SENSATION IN HEAD-FEELS DIZZIY AND  FAINT AS THE PRESSURE RESOLVES  . Hernia, incisional, RLQ, s/p lap repair Sep 2013 09/02/2011  . Hx of migraines 10/19/2011  . Hypertension   . Leg pain, right    "like bad Charley horse"  . Panic disorder without agoraphobia   . Rash    HANDS, ARMS --STATES HX OF RASH EVER SINCE CHILDBIRTH/PREGNANCY.  STATES THE RASH OFTEN OCCURS WHEN SHE IS REALLY STRESSED."goes and comes-presently left ring finger"  . Restless leg syndrome    DIAGNOSED BY SLEEP STUDY - PT TOLD SHE DID NOT HAVE SLEEP APNEA  . Right rotator cuff tear    PAIN IN RIGHT SHOULDER  . SBO (small bowel obstruction) ([place]) 06/03/2012  . Spastic hemiplegia affecting dominant side ([place])   . Stroke ([place])   . Ventral hernia    RIGHT LOWER QUADRANT-CAUSING SOME PAIN   BP 136/84   Pulse 88   SpO2 98%   Opioid Risk Score:   Fall Risk Score:  `1  Depression screen PHQ 2/9  Depression screen PHQ 2/9 03/08/2015  Decreased Interest 2  Down, Depressed, Hopeless 2  PHQ - 2 Score 4  Altered sleeping 3  Tired, decreased energy 3  Change in appetite 1  Feeling bad or failure about yourself  2  Trouble concentrating 3  Moving slowly or fidgety/restless 2  Suicidal thoughts 1  PHQ-9 Score 19  Difficult doing work/chores  Somewhat difficult    Review of Systems  Constitutional: Negative.   HENT: Negative.   Eyes: Negative.   Respiratory: Negative.   Cardiovascular: Negative.   Gastrointestinal: Negative.   Endocrine: Negative.   Genitourinary: Negative.   Musculoskeletal: Negative.   Skin: Negative.   Allergic/Immunologic: Negative.   Neurological: Negative.   Hematological: Negative.   Psychiatric/Behavioral: Negative.        Objective:   Physical Exam  Constitutional: She is oriented to person, place, and time. She appears well-developed and well-nourished.  HENT:  Head: Normocephalic and atraumatic.  Eyes: Conjunctivae and EOM are normal. Pupils are  equal, round, and reactive to light.  Musculoskeletal:       Right ankle: Tenderness. Lateral malleolus and AITFL tenderness found. Achilles tendon normal.  Neurological: She is alert and oriented to person, place, and time.  Psychiatric: She has a normal mood and affect.  Nursing note and vitals reviewed.   Motor strength is 4/5 in the right deltoid, bicep, tricep, grip, right hip flexor, knee extensor, ankle dorsiflexor. Right shoulder range of motion full external rotation but lacks internal rotation. He is able to lace her hand behind her back in her lumbar area, but not up to the thoracic area. Right ankle without evidence of effusion. No evidence of ecchymosis. There is tenderness to palpation along the distal aspect of the fibula as well as along the anterior talofibular ligament. There is pain with passive inversion of the foot. There is no tenderness over the medial malleolus. No tenderness over the Achilles. There is no tenderness over the mid foot.     Assessment & Plan:  1. Chronic right spastic hemiparesis following left pontine infarct 2011.Continue tizanidine 2 mg daily at bedtime, 1 mg every morning when necessary  2. Right anterior talofibular sprain Patient has had history of previous ankle inversion injuries and has had a  recurrent injury without evidence of fracture on x-ray. Given continued symptoms, will place in CAM walker boot for 4 weeks and have her recheck with me. If she remains tender over the fibula, may need to re-image, otherwise would likely progress to a lace up ankle splint. She may benefit from additional rehabilitation on her ankle to help with proprioception and ankle stabilizer strength.  3. Chronic low back pain, continue tramadol 50 mg twice a day   [name] M.D. [facility] FAAPM&R (Sports Med, Neuromuscular Med) Diplomate Am Board of Electrodiagnostic Med

## 2016-06-02 ENCOUNTER — Ambulatory Visit (HOSPITAL_BASED_OUTPATIENT_CLINIC_OR_DEPARTMENT_OTHER): Payer: Federal, State, Local not specified - PPO | Admitting: Physical Medicine & Rehabilitation

## 2016-06-02 ENCOUNTER — Encounter: Payer: Federal, State, Local not specified - PPO | Attending: Physical Medicine & Rehabilitation

## 2016-06-02 ENCOUNTER — Encounter: Payer: Self-pay | Admitting: Physical Medicine & Rehabilitation

## 2016-06-02 VITALS — BP 125/76 | HR 77

## 2016-06-02 DIAGNOSIS — G8929 Other chronic pain: Secondary | ICD-10-CM | POA: Diagnosis not present

## 2016-06-02 DIAGNOSIS — Z9889 Other specified postprocedural states: Secondary | ICD-10-CM | POA: Insufficient documentation

## 2016-06-02 DIAGNOSIS — E1140 Type 2 diabetes mellitus with diabetic neuropathy, unspecified: Secondary | ICD-10-CM | POA: Diagnosis not present

## 2016-06-02 DIAGNOSIS — G43909 Migraine, unspecified, not intractable, without status migrainosus: Secondary | ICD-10-CM | POA: Insufficient documentation

## 2016-06-02 DIAGNOSIS — S93491A Sprain of other ligament of right ankle, initial encounter: Secondary | ICD-10-CM | POA: Diagnosis not present

## 2016-06-02 DIAGNOSIS — E119 Type 2 diabetes mellitus without complications: Secondary | ICD-10-CM | POA: Diagnosis not present

## 2016-06-02 DIAGNOSIS — Z79891 Long term (current) use of opiate analgesic: Secondary | ICD-10-CM | POA: Insufficient documentation

## 2016-06-02 DIAGNOSIS — M545 Low back pain, unspecified: Secondary | ICD-10-CM

## 2016-06-02 DIAGNOSIS — G8111 Spastic hemiplegia affecting right dominant side: Secondary | ICD-10-CM | POA: Insufficient documentation

## 2016-06-02 DIAGNOSIS — Z833 Family history of diabetes mellitus: Secondary | ICD-10-CM | POA: Diagnosis not present

## 2016-06-02 DIAGNOSIS — D649 Anemia, unspecified: Secondary | ICD-10-CM | POA: Diagnosis not present

## 2016-06-02 DIAGNOSIS — Z841 Family history of disorders of kidney and ureter: Secondary | ICD-10-CM | POA: Insufficient documentation

## 2016-06-02 DIAGNOSIS — Z823 Family history of stroke: Secondary | ICD-10-CM | POA: Diagnosis not present

## 2016-06-02 DIAGNOSIS — I10 Essential (primary) hypertension: Secondary | ICD-10-CM | POA: Insufficient documentation

## 2016-06-02 DIAGNOSIS — I6529 Occlusion and stenosis of unspecified carotid artery: Secondary | ICD-10-CM

## 2016-06-02 MED ORDER — METHOCARBAMOL 500 MG PO TABS: 500.0000 mg | ORAL_TABLET | Freq: Three times a day (TID) | ORAL | 1 refills | Status: DC

## 2016-06-02 NOTE — Patient Instructions (Signed)
May double up on tramadol  Referral made to physical therapy

## 2016-06-02 NOTE — Progress Notes (Signed)
Subjective:    Patient ID: Tina [name], female    DOB: 1970-07-29, 46 y.o.   MRN: [id number]  HPI 46 year old female with history of CVAs causing right hemiplegia complaining of low back pain, which at one point was intermittent, but now has become more constant. Mopping and sweeping tent at great this. Pain is in the lower part of the back in the middle. Bending forward tends to aggravate this. Patient is tried heating pad, as well as ice as well as ThermaCare patches as well as BenGay. Taking tramadol 1 tablet in the morning, 1 tablet at night.   Pain Inventory Average Pain 7 Pain Right Now 7 My pain is sharp, burning, stabbing and aching  In the last 24 hours, has pain interfered with the following? General activity 8 Relation with others 8 Enjoyment of life 8 What TIME of day is your pain at its worst? daytime Sleep (in general) Fair  Pain is worse with: walking, standing and some activites Pain improves with: rest, heat/ice and medication Relief from Meds: 7  Mobility use a cane do you drive?  no  Function Do you have any goals in this area?  no  Neuro/Psych numbness spasms dizziness confusion depression anxiety  Prior Studies Any changes since last visit?  no MRI LUMBAR SPINE WITHOUT CONTRAST  Technique:  Multiplanar and multiecho pulse sequences of the lumbar spine were obtained without intravenous contrast.  Comparison: Plain films lumbar spine 06/05/2011.  Findings: Vertebral body height is maintained.  Hemangiomas in T12, L2 and L3 are noted.  No worrisome marrow lesion.  The conus medullaris is normal in signal and position.  The central spinal canal and neural foramina are widely patent at all levels.  Disc height and hydration are normal throughout.  There is partial visualization of a 3 cm uterine fibroid.  Visualized intra- abdominal contents are otherwise unremarkable.  IMPRESSION: Normal appearing lumbar spine.  Fibroid uterus  noted. Physicians involved in your care Any changes since last visit?  no   Family History  Problem Relation Age of Onset  . Diabetes Father   . Kidney disease Father   . Diabetes Maternal Grandmother   . Hyperlipidemia Paternal Grandmother   . Stroke Paternal Grandmother   . Other Neg Hx    Social History   Social History  . Marital status: Married    Spouse name: N/A  . Number of children: 2  . Years of education: BA degree   Occupational History  .  Unemployed   Social History Main Topics  . Smoking status: Never Smoker  . Smokeless tobacco: Never Used  . Alcohol use No  . Drug use: No  . Sexual activity: Yes    Birth control/ protection: None   Other Topics Concern  . None   Social History Narrative   Patient is married with 2 children.   Patient is right handed.   Patient has her  BA degree.   Patient drinks 1 cup daily.   Past Surgical History:  Procedure Laterality Date  . APPLICATION OF WOUND VAC N/A 05/25/2015   Procedure: APPLICATION OF WOUND VAC;  Surgeon: Michael Boston, MD;  Location: [facility];  Service: General;  Laterality: N/A;  . CESAREAN SECTION  2012  . DIAGNOSTIC LAPAROSCOPY    . ESOPHAGOGASTRODUODENOSCOPY N/A 06/03/2012   Procedure: ESOPHAGOGASTRODUODENOSCOPY (EGD);  Surgeon: [name] Craver, MD;  Location: [facility] ENDOSCOPY;  Service: Endoscopy;  Laterality: N/A;  . EXCISION MASS ABDOMINAL N/A 05/25/2015   Procedure: ABDOMINAL  WALL EXPLORATION EXCISION OF SEROMA REMOVAL OF REDUNDANT SKIN ;  Surgeon: Michael Boston, MD;  Location: [facility];  Service: General;  Laterality: N/A;  . EYE SURGERY     Eye laser for vessel hemorrhaging  . HERNIA REPAIR  10/03/11   ventral hernia repair  . INSERTION OF MESH N/A 02/04/2013   Procedure: INSERTION OF MESH;  Surgeon: Adin Hector, MD;  Location: [facility];  Service: General;  Laterality: N/A;  . UMBILICAL HERNIA REPAIR N/A 02/04/2013   Procedure: LAPAROSCOPIC ventral wall hernia repair LAPAROSCOPIC LYSIS OF ADHESIONS  laparoscopic exploration of abdomen ;  Surgeon: Adin Hector, MD;  Location: [facility];  Service: General;  Laterality: N/A;  . URETER REVISION     Bilateral "twisted"  . UTERINE FIBROID SURGERY     2 SURGERIES FOR FIBROIDS  . VENTRAL HERNIA REPAIR  10/03/2011   Procedure: LAPAROSCOPIC VENTRAL HERNIA;  Surgeon: Adin Hector, MD;  Location: [facility];  Service: General;  Laterality: N/A;   Past Medical History:  Diagnosis Date  . Anemia    DURING MENSES--HAS HEAVY BLEEDING WITH PERODS  . Anxiety   . Back pain, chronic    "ongoing"  . Blood transfusion    IN 2012  AFTER C -SECTION  . Cerebral thrombosis with cerebral infarction [facility]) JUNE 2011   RIGHT SIDED WEAKNESS ( ARM AND LEG ) AND SPASMS-remains with slight weakness and vertigo.  . Depression   . Diabetes mellitus   . Diabetic neuropathy ([place])    BOTH FEET --COMES AND GOES  . Endometriosis   . Headache(784.0)    MIGRAINES--NOT REALLY HEADACHE-MORE LIKE PRESSURE SENSATION IN HEAD-FEELS DIZZIY AND  FAINT AS THE PRESSURE RESOLVES  . Hernia, incisional, RLQ, s/p lap repair Sep 2013 09/02/2011  . Hx of migraines 10/19/2011  . Hypertension   . Leg pain, right    "like bad Charley horse"  . Panic disorder without agoraphobia   . Rash    HANDS, ARMS --STATES HX OF RASH EVER SINCE CHILDBIRTH/PREGNANCY.  STATES THE RASH OFTEN OCCURS WHEN SHE IS REALLY STRESSED."goes and comes-presently left ring finger"  . Restless leg syndrome    DIAGNOSED BY SLEEP STUDY - PT TOLD SHE DID NOT HAVE SLEEP APNEA  . Right rotator cuff tear    PAIN IN RIGHT SHOULDER  . SBO (small bowel obstruction) ([place]) 06/03/2012  . Spastic hemiplegia affecting dominant side ([place])   . Stroke ([place])   . Ventral hernia    RIGHT LOWER QUADRANT-CAUSING SOME PAIN   There were no vitals taken for this visit.  Opioid Risk Score:   Fall Risk Score:  `1  Depression screen PHQ 2/9  Depression screen PHQ 2/9 03/08/2015  Decreased Interest 2  Down, Depressed, Hopeless 2    PHQ - 2 Score 4  Altered sleeping 3  Tired, decreased energy 3  Change in appetite 1  Feeling bad or failure about yourself  2  Trouble concentrating 3  Moving slowly or fidgety/restless 2  Suicidal thoughts 1  PHQ-9 Score 19  Difficult doing work/chores Somewhat difficult    Review of Systems  Constitutional: Positive for diaphoresis.  HENT: Negative.   Eyes: Negative.   Respiratory: Positive for shortness of breath.   Cardiovascular: Negative.   Gastrointestinal: Positive for abdominal pain and nausea.  Endocrine: Negative.   Genitourinary: Negative.   Musculoskeletal: Negative.   Skin: Positive for rash.  Allergic/Immunologic: Negative.   Neurological: Negative.   Hematological: Negative.   Psychiatric/Behavioral: Negative.  All other systems reviewed and are negative.      Objective:   Physical Exam  Constitutional: She is oriented to person, place, and time. She appears well-developed and well-nourished.  HENT:  Head: Normocephalic and atraumatic.  Eyes: Conjunctivae and EOM are normal. Pupils are equal, round, and reactive to light.  Neck: Normal range of motion.  Neurological: She is alert and oriented to person, place, and time.  Motor strength is 4/5 in the right deltoid by stress of grip, hip flexor, knee extensor, ankle dorsi flexor. 5/5 left deltoid, biceps, triceps, grip, hip flexor, knee extensor, ankle dorsiflexor. Negative straight leg raising.   Psychiatric: She has a normal mood and affect. Her behavior is normal. Judgment and thought content normal.  Nursing note and vitals reviewed.   Pain with extension. Lumbar spine, forward flexion actually helps stretch it.      Assessment & Plan:  1. Acute exacerbation of chronic low back pain. 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 She has some compensatory hip hiking. Would recommend physical therapy twice a week for 4 weeks  Return to clinic 1 month. Consider injections  for lumbar facet syndrome, i.e. medial branch blocks.  May double up on tramadol to 2 tablets twice a day for a month as needed.  Have also prescribed methocarbamol 500 mg 3 times a day when necessary for one week

## 2016-06-05 DIAGNOSIS — F411 Generalized anxiety disorder: Secondary | ICD-10-CM | POA: Diagnosis not present

## 2016-06-05 DIAGNOSIS — F331 Major depressive disorder, recurrent, moderate: Secondary | ICD-10-CM | POA: Diagnosis not present

## 2016-06-11 DIAGNOSIS — E113592 Type 2 diabetes mellitus with proliferative diabetic retinopathy without macular edema, left eye: Secondary | ICD-10-CM | POA: Diagnosis not present

## 2016-06-11 DIAGNOSIS — H4312 Vitreous hemorrhage, left eye: Secondary | ICD-10-CM | POA: Diagnosis not present

## 2016-06-11 DIAGNOSIS — E113591 Type 2 diabetes mellitus with proliferative diabetic retinopathy without macular edema, right eye: Secondary | ICD-10-CM | POA: Diagnosis not present

## 2016-06-11 DIAGNOSIS — H4311 Vitreous hemorrhage, right eye: Secondary | ICD-10-CM | POA: Diagnosis not present

## 2016-06-16 DIAGNOSIS — E113512 Type 2 diabetes mellitus with proliferative diabetic retinopathy with macular edema, left eye: Secondary | ICD-10-CM | POA: Diagnosis not present

## 2016-06-25 DIAGNOSIS — H4312 Vitreous hemorrhage, left eye: Secondary | ICD-10-CM | POA: Diagnosis not present

## 2016-06-25 DIAGNOSIS — E113512 Type 2 diabetes mellitus with proliferative diabetic retinopathy with macular edema, left eye: Secondary | ICD-10-CM | POA: Diagnosis not present

## 2016-06-25 DIAGNOSIS — E113592 Type 2 diabetes mellitus with proliferative diabetic retinopathy without macular edema, left eye: Secondary | ICD-10-CM | POA: Diagnosis not present

## 2016-06-25 DIAGNOSIS — E113292 Type 2 diabetes mellitus with mild nonproliferative diabetic retinopathy without macular edema, left eye: Secondary | ICD-10-CM | POA: Diagnosis not present

## 2016-07-01 ENCOUNTER — Encounter (HOSPITAL_COMMUNITY): Payer: Self-pay | Admitting: *Deleted

## 2016-07-01 ENCOUNTER — Emergency Department (HOSPITAL_COMMUNITY)
Admission: EM | Admit: 2016-07-01 | Discharge: 2016-07-01 | Disposition: A | Discharge status: Home / Self Care | Payer: Federal, State, Local not specified - PPO | Attending: Emergency Medicine | Admitting: Emergency Medicine

## 2016-07-01 DIAGNOSIS — Z7982 Long term (current) use of aspirin: Secondary | ICD-10-CM | POA: Insufficient documentation

## 2016-07-01 DIAGNOSIS — Z794 Long term (current) use of insulin: Secondary | ICD-10-CM | POA: Diagnosis not present

## 2016-07-01 DIAGNOSIS — T7840XA Allergy, unspecified, initial encounter: Secondary | ICD-10-CM | POA: Diagnosis not present

## 2016-07-01 DIAGNOSIS — E1140 Type 2 diabetes mellitus with diabetic neuropathy, unspecified: Secondary | ICD-10-CM | POA: Insufficient documentation

## 2016-07-01 DIAGNOSIS — I10 Essential (primary) hypertension: Secondary | ICD-10-CM | POA: Insufficient documentation

## 2016-07-01 DIAGNOSIS — Z79899 Other long term (current) drug therapy: Secondary | ICD-10-CM | POA: Diagnosis not present

## 2016-07-01 DIAGNOSIS — T368X5A Adverse effect of other systemic antibiotics, initial encounter: Secondary | ICD-10-CM | POA: Diagnosis not present

## 2016-07-01 DIAGNOSIS — Z8673 Personal history of transient ischemic attack (TIA), and cerebral infarction without residual deficits: Secondary | ICD-10-CM | POA: Insufficient documentation

## 2016-07-01 DIAGNOSIS — Z91040 Latex allergy status: Secondary | ICD-10-CM | POA: Insufficient documentation

## 2016-07-01 LAB — URINALYSIS, ROUTINE W REFLEX MICROSCOPIC
Bilirubin Urine: NEGATIVE
GLUCOSE, UA: NEGATIVE mg/dL
HGB URINE DIPSTICK: NEGATIVE
KETONES UR: NEGATIVE mg/dL
Leukocytes, UA: NEGATIVE
Nitrite: NEGATIVE
PH: 5 (ref 5.0–8.0)
PROTEIN: NEGATIVE mg/dL
Specific Gravity, Urine: 1.01 (ref 1.005–1.030)

## 2016-07-01 LAB — CBC
HCT: 35.7 % — ABNORMAL LOW (ref 36.0–46.0)
Hemoglobin: 11.5 g/dL — ABNORMAL LOW (ref 12.0–15.0)
MCH: 25.3 pg — AB (ref 26.0–34.0)
MCHC: 32.2 g/dL (ref 30.0–36.0)
MCV: 78.5 fL (ref 78.0–100.0)
PLATELETS: 271 10*3/uL (ref 150–400)
RBC: 4.55 MIL/uL (ref 3.87–5.11)
RDW: 15.1 % (ref 11.5–15.5)
WBC: 5.6 10*3/uL (ref 4.0–10.5)

## 2016-07-01 LAB — BASIC METABOLIC PANEL
Anion gap: 7 (ref 5–15)
BUN: 18 mg/dL (ref 6–20)
CALCIUM: 8.6 mg/dL — AB (ref 8.9–10.3)
CO2: 20 mmol/L — AB (ref 22–32)
Chloride: 107 mmol/L (ref 101–111)
Creatinine, Ser: 0.96 mg/dL (ref 0.44–1.00)
GFR calc Af Amer: >60 mL/min (ref >60)
GLUCOSE: 144 mg/dL — AB (ref 65–99)
Potassium: 3.6 mmol/L (ref 3.5–5.1)
Sodium: 134 mmol/L — ABNORMAL LOW (ref 135–145)

## 2016-07-01 MED ORDER — METHYLPREDNISOLONE SODIUM SUCC 125 MG IJ SOLR
125.0000 mg | Freq: Once | INTRAMUSCULAR | Status: AC
  Administered 2016-07-01: 125 mg via INTRAVENOUS
  Filled 2016-07-01: qty 2

## 2016-07-01 MED ORDER — FAMOTIDINE 20 MG PO TABS: 20.0000 mg | ORAL_TABLET | Freq: Two times a day (BID) | ORAL | 0 refills | Status: DC

## 2016-07-01 MED ORDER — FAMOTIDINE IN NACL 20-0.9 MG/50ML-% IV SOLN
20.0000 mg | Freq: Once | INTRAVENOUS | Status: AC
  Administered 2016-07-01: 20 mg via INTRAVENOUS
  Filled 2016-07-01: qty 50

## 2016-07-01 MED ORDER — DIPHENHYDRAMINE HCL 25 MG PO TABS: 25.0000 mg | ORAL_TABLET | Freq: Four times a day (QID) | ORAL | 0 refills | Status: DC

## 2016-07-01 MED ORDER — DIPHENHYDRAMINE HCL 50 MG/ML IJ SOLN
50.0000 mg | Freq: Once | INTRAMUSCULAR | Status: AC
  Administered 2016-07-01: 50 mg via INTRAVENOUS
  Filled 2016-07-01: qty 1

## 2016-07-01 MED ORDER — SODIUM CHLORIDE 0.9 % IV BOLUS (SEPSIS)
1000.0000 mL | Freq: Once | INTRAVENOUS | Status: AC
  Administered 2016-07-01: 1000 mL via INTRAVENOUS

## 2016-07-01 MED ORDER — PREDNISONE 20 MG PO TABS: 40.0000 mg | ORAL_TABLET | Freq: Every day | ORAL | 0 refills | Status: AC

## 2016-07-01 MED FILL — predniSONE 20 MG TABS: 20 | 3 days supply | Qty: 6 | Fill #0

## 2016-07-01 MED FILL — FAMOTIDINE 20 MG TABLET: 20 | 5 days supply | Qty: 10 | Fill #0

## 2016-07-01 NOTE — ED Provider Notes (Signed)
[facility] DEPT Provider Note   CSN: [id number] Arrival date & time: 07/01/16  9211     History   Chief Complaint Chief Complaint  Patient presents with  . Allergic Reaction    HPI Tina [name] is a 46 y.o. female.  HPI Patient ports nausea vomiting diarrhea since yesterday.  She had recent eye surgery 6 days ago and is placing ofloxacin eyedrops in her left eye.  She presents the emergency department today because of increasing swelling of her upper and lower lips.  She had mild shortness of breath.  She has a history of allergic reaction and history of angioedema.  She is not on Ace inhibitors.  No difficulty swallowing.  No change in her speech.  The only new medication for her is the antibiotic eyedrops.  She does report taking Aleve 2 days ago.  She still able to speak and swallow fluids and eat food.  Her symptoms have persisted through the morning and/or worsening of that she came to the ER for evaluation.  She tried oral Benadryl at home without improvement   Past Medical History:  Diagnosis Date  . Anemia    DURING MENSES--HAS HEAVY BLEEDING WITH PERODS  . Anxiety   . Back pain, chronic    "ongoing"  . Blood transfusion    IN 2012  AFTER C -SECTION  . Cerebral thrombosis with cerebral infarction [facility]) JUNE 2011   RIGHT SIDED WEAKNESS ( ARM AND LEG ) AND SPASMS-remains with slight weakness and vertigo.  . Depression   . Diabetes mellitus   . Diabetic neuropathy ([place])    BOTH FEET --COMES AND GOES  . Endometriosis   . Headache(784.0)    MIGRAINES--NOT REALLY HEADACHE-MORE LIKE PRESSURE SENSATION IN HEAD-FEELS DIZZIY AND  FAINT AS THE PRESSURE RESOLVES  . Hernia, incisional, RLQ, s/p lap repair Sep 2013 09/02/2011  . Hx of migraines 10/19/2011  . Hypertension   . Leg pain, right    "like bad Charley horse"  . Panic disorder without agoraphobia   . Rash    HANDS, ARMS --STATES HX OF RASH EVER SINCE CHILDBIRTH/PREGNANCY.  STATES THE RASH OFTEN OCCURS WHEN  SHE IS REALLY STRESSED."goes and comes-presently left ring finger"  . Restless leg syndrome    DIAGNOSED BY SLEEP STUDY - PT TOLD SHE DID NOT HAVE SLEEP APNEA  . Right rotator cuff tear    PAIN IN RIGHT SHOULDER  . SBO (small bowel obstruction) ([place]) 06/03/2012  . Spastic hemiplegia affecting dominant side ([place])   . Stroke ([place])   . Ventral hernia    RIGHT LOWER QUADRANT-CAUSING SOME PAIN    Patient Active Problem List   Diagnosis Date Noted  . OSA (obstructive sleep apnea) 10/17/2015  . Periodic limb movement disorder (PLMD) 10/17/2015  . Essential hypertension 05/26/2015  . Type 2 diabetes mellitus with hyperglycemia, with long-term current use of insulin ([place]) 05/26/2015  . Abdominal wall mass of right lower quadrant 05/25/2015  . Hemiparesis affecting right side as late effect of stroke ([place]) 11/09/2014  . Abdominal wall seroma ([place])   . Sepsis due to undetermined organism ([place]) 03/06/2014  . Nausea vomiting and diarrhea 03/06/2014  . Sepsis ([place]) 03/06/2014  . Tension headache 09/01/2013  . Nausea with vomiting ?Gastroparesis? 03/21/2013  . Spastic hemiplegia affecting dominant side ([place]) 02/16/2013  . Recurrent ventral incisional hernia s/p closure/repair w mesh 02/04/2013 01/26/2013  . Constipation, chronic 01/26/2013  . Abdominal pain, chronic, right lower quadrant 06/03/2012  . History of CVA (cerebrovascular accident)  06/03/2012  . HTN (hypertension) 06/03/2012  . Diabetes mellitus type II, uncontrolled ([place]) 10/16/2011  . Obesity (BMI 30-39.9) 09/02/2011  . Rotator cuff tear 08/25/2011  . Sciatica 06/10/2011  . Paresthesias in right hand 06/10/2011  . Lumbago 05/09/2011  . Paresthesias 05/09/2011    Past Surgical History:  Procedure Laterality Date  . APPLICATION OF WOUND VAC N/A 05/25/2015   Procedure: APPLICATION OF WOUND VAC;  Surgeon: Michael Boston, MD;  Location: [facility] ORS;  Service: General;  Laterality: N/A;  . CESAREAN SECTION  2012  . DIAGNOSTIC LAPAROSCOPY     . ESOPHAGOGASTRODUODENOSCOPY N/A 06/03/2012   Procedure: ESOPHAGOGASTRODUODENOSCOPY (EGD);  Surgeon: [name] Craver, MD;  Location: [facility] ENDOSCOPY;  Service: Endoscopy;  Laterality: N/A;  . EXCISION MASS ABDOMINAL N/A 05/25/2015   Procedure: ABDOMINAL WALL EXPLORATION EXCISION OF SEROMA REMOVAL OF REDUNDANT SKIN ;  Surgeon: Michael Boston, MD;  Location: [facility] ORS;  Service: General;  Laterality: N/A;  . EYE SURGERY     Eye laser for vessel hemorrhaging  . HERNIA REPAIR  10/03/11   ventral hernia repair  . INSERTION OF MESH N/A 02/04/2013   Procedure: INSERTION OF MESH;  Surgeon: Adin Hector, MD;  Location: [facility];  Service: General;  Laterality: N/A;  . UMBILICAL HERNIA REPAIR N/A 02/04/2013   Procedure: LAPAROSCOPIC ventral wall hernia repair LAPAROSCOPIC LYSIS OF ADHESIONS laparoscopic exploration of abdomen ;  Surgeon: Adin Hector, MD;  Location: [facility]S;  Service: General;  Laterality: N/A;  . URETER REVISION     Bilateral "twisted"  . UTERINE FIBROID SURGERY     2 SURGERIES FOR FIBROIDS  . VENTRAL HERNIA REPAIR  10/03/2011   Procedure: LAPAROSCOPIC VENTRAL HERNIA;  Surgeon: Adin Hector, MD;  Location: [facility];  Service: General;  Laterality: N/A;    OB History    Gravida Para Term Preterm AB Living   1 1 0 1 0 1   SAB TAB Ectopic Multiple Live Births   0 0 0 0         Home Medications    Prior to Admission medications   Medication Sig Start Date End Date Taking? Authorizing Provider  allopurinol (ZYLOPRIM) 100 MG tablet Take 100 mg by mouth every evening. 03/13/14  Yes [provider]  Amlodipine-Valsartan-HCTZ 25-852-77 MG TABS Take 1 tablet by mouth daily.  02/09/16  Yes [provider]  aspirin 325 MG tablet Take 325 mg by mouth at bedtime.    Yes [provider]  colchicine 0.6 MG tablet Take 0.6 mg by mouth daily.  02/14/14  Yes [provider]  Cyanocobalamin (VITAMIN B-12 PO) Take 1 tablet by mouth daily.   Yes [provider]    furosemide (LASIX) 40 MG tablet Take 20-40 mg by mouth daily as needed for fluid or edema.    Yes [provider]  gabapentin (NEURONTIN) 600 MG tablet Take 600-1,200 mg by mouth See admin instructions. Take 2 tablets every morning, 1 tablet at noon and 2 tablets every evening   Yes [provider]  Insulin Glargine (BASAGLAR KWIKPEN) 100 UNIT/ML SOPN Inject 25 Units into the skin 2 (two) times daily.    Yes [provider]  Liraglutide (VICTOZA) 18 MG/3ML SOPN Inject 1.2 mg into the skin daily.    Yes [provider]  methocarbamol (ROBAXIN) 500 MG tablet Take 1 tablet (500 mg total) by mouth 3 (three) times daily. 06/02/16  Yes Kirsteins, Luanna Salk, MD  NOVOLOG FLEXPEN 100 UNIT/ML FlexPen Inject 30 Units  into the skin 3 (three) times daily. Sliding scale 12/14/13  Yes [provider]  ofloxacin (OCUFLOX) 0.3 % ophthalmic solution Place 1 drop into the left eye 4 (four) times daily.   Yes [provider]  ONE TOUCH ULTRA TEST test strip  06/30/15  Yes [provider]  prednisoLONE acetate (PRED FORTE) 1 % ophthalmic suspension Place 1 drop into the left eye 4 (four) times daily.   Yes [provider]  propranolol (INDERAL) 20 MG tablet Take 20 mg by mouth every evening.   Yes [provider]  sertraline (ZOLOFT) 50 MG tablet Take 50 mg by mouth daily.   Yes [provider]  tiZANidine (ZANAFLEX) 2 MG tablet Take 1 tablet at bedtime daily. Can take 1/2 tablet in the AM PRN. 05/05/16  Yes Kirsteins, Luanna Salk, MD  traMADol (ULTRAM) 50 MG tablet Take 1 tablet (50 mg total) by mouth 2 (two) times daily as needed for moderate pain or severe pain. 05/05/16  Yes Kirsteins, Luanna Salk, MD  amLODipine (NORVASC) 10 MG tablet Take 1 tablet (10 mg total) by mouth daily. Patient not taking: Reported on 07/01/2016 11/18/15   Pisciotta, Elmyra Ricks, PA-C  diphenhydrAMINE (BENADRYL) 25 MG tablet Take 1 tablet (25 mg total) by mouth every 6  (six) hours. 07/01/16   Jola Schmidt, MD  famotidine (PEPCID) 20 MG tablet Take 1 tablet (20 mg total) by mouth 2 (two) times daily. 07/01/16   Jola Schmidt, MD  predniSONE (DELTASONE) 20 MG tablet Take 2 tablets (40 mg total) by mouth daily. 07/01/16 07/04/16  Jola Schmidt, MD  rOPINIRole (REQUIP) 0.5 MG tablet Take 1 tablet (0.5 mg total) by mouth at bedtime. Patient not taking: Reported on 07/01/2016 11/26/15   Dohmeier, Asencion Partridge, MD    Family History Family History  Problem Relation Age of Onset  . Diabetes Father   . Kidney disease Father   . Diabetes Maternal Grandmother   . Hyperlipidemia Paternal Grandmother   . Stroke Paternal Grandmother   . Other Neg Hx     Social History Social History  Substance Use Topics  . Smoking status: Never Smoker  . Smokeless tobacco: Never Used  . Alcohol use No     Allergies   Contrast media [iodinated diagnostic agents]; Iohexol; Midazolam hcl; Shellfish allergy; Metformin and related; Other; Avandia [rosiglitazone maleate]; Geodon [ziprasidone]; Kiwi extract; and Latex   Review of Systems Review of Systems  All other systems reviewed and are negative.    Physical Exam Updated Vital Signs BP 138/75   Pulse 77   Temp 98.4 F (36.9 C) (Oral)   Resp (!) 25   Ht 5\' 6"  (1.676 m)   Wt 106.6 kg (235 lb)   SpO2 99%   BMI 37.93 kg/m   Physical Exam  Constitutional: She is oriented to person, place, and time. She appears well-developed and well-nourished. No distress.  HENT:  Head: Normocephalic.  Oral airway patent.  No swelling of the tongue or uvula.  Swelling of both the upper and lower lips consistent with angioedema.  Eyes: EOM are normal.  Neck: Normal range of motion.  Cardiovascular: Normal rate, regular rhythm and normal heart sounds.   Pulmonary/Chest: Effort normal and breath sounds normal.  Abdominal: Soft. She exhibits no distension. There is no tenderness.  Musculoskeletal: Normal range of motion.  Neurological:  She is alert and oriented to person, place, and time.  Skin: Skin is warm and dry.  Psychiatric: She has a normal mood and affect. Judgment  normal.  Nursing note and vitals reviewed.    ED Treatments / Results  Labs (all labs ordered are listed, but only abnormal results are displayed) Labs Reviewed  CBC - Abnormal; Notable for the following:       Result Value   Hemoglobin 11.5 (*)    HCT 35.7 (*)    MCH 25.3 (*)    All other components within normal limits  BASIC METABOLIC PANEL - Abnormal; Notable for the following:    Sodium 134 (*)    CO2 20 (*)    Glucose, Bld 144 (*)    Calcium 8.6 (*)    All other components within normal limits  URINALYSIS, ROUTINE W REFLEX MICROSCOPIC    EKG  EKG Interpretation None       Radiology No results found.  Procedures Procedures (including critical care time)  Medications Ordered in ED Medications  diphenhydrAMINE (BENADRYL) injection 50 mg (50 mg Intravenous Given 07/01/16 1038)  famotidine (PEPCID) IVPB 20 mg premix (0 mg Intravenous Stopped 07/01/16 1114)  methylPREDNISolone sodium succinate (SOLU-MEDROL) 125 mg/2 mL injection 125 mg (125 mg Intravenous Given 07/01/16 1038)  sodium chloride 0.9 % bolus 1,000 mL (0 mLs Intravenous Stopped 07/01/16 1403)     Initial Impression / Assessment and Plan / ED Course  I have reviewed the triage vital signs and the nursing notes.  Pertinent labs & imaging results that were available during my care of the patient were reviewed by me and considered in my medical decision making (see chart for details).     Improvement in her swelling while in the emergency department.  This is treated his angioedema.  She will speak with her eye doctor about changing her eye antibiotics around.  I recommend that she stay away from Aleve at this point as well.  Primary care follow-up.  Oral airway patent.  No change in her voice.  Patient understands return to the ER for new or worsening symptoms.  Final  Clinical Impressions(s) / ED Diagnoses   Final diagnoses:  Allergic reaction, initial encounter    New Prescriptions Discharge Medication List as of 07/01/2016  2:11 PM    START taking these medications   Details  diphenhydrAMINE (BENADRYL) 25 MG tablet Take 1 tablet (25 mg total) by mouth every 6 (six) hours., Starting Tue 07/01/2016, Print    famotidine (PEPCID) 20 MG tablet Take 1 tablet (20 mg total) by mouth 2 (two) times daily., Starting Tue 07/01/2016, Print    predniSONE (DELTASONE) 20 MG tablet Take 2 tablets (40 mg total) by mouth daily., Starting Tue 07/01/2016, Until Fri 07/04/2016, Print         Jola Schmidt, MD 07/01/16 [phone]

## 2016-07-01 NOTE — ED Triage Notes (Signed)
To ED for eval of facial swelling, nausea, and vomiting since yesterday. Pt had eye surgery for 'bleeding in eye' last Wednesday. Pt's only new medicine is eye drop antibiotics. Pt is unable to see out of left eye and told by eye doctor it may be 10 days before vision returns. Pt states pain in her eye has increased since vomiting. Moderate to severe swelling to lips. Pt states she feels sob. Able to visualize airway when pt opens mouth. Speaks in full sentences. States she hasn't been able to sleep since Saturday.

## 2016-07-01 NOTE — ED Notes (Signed)
Pt wheeled to room and placed on bp cuff and o2 monitor.

## 2016-07-02 DIAGNOSIS — H4312 Vitreous hemorrhage, left eye: Secondary | ICD-10-CM | POA: Diagnosis not present

## 2016-07-02 DIAGNOSIS — Z09 Encounter for follow-up examination after completed treatment for conditions other than malignant neoplasm: Secondary | ICD-10-CM | POA: Diagnosis not present

## 2016-07-04 ENCOUNTER — Ambulatory Visit (HOSPITAL_BASED_OUTPATIENT_CLINIC_OR_DEPARTMENT_OTHER): Payer: Federal, State, Local not specified - PPO | Admitting: Physical Medicine & Rehabilitation

## 2016-07-04 ENCOUNTER — Encounter: Payer: Federal, State, Local not specified - PPO | Attending: Physical Medicine & Rehabilitation

## 2016-07-04 ENCOUNTER — Encounter: Payer: Self-pay | Admitting: Physical Medicine & Rehabilitation

## 2016-07-04 VITALS — BP 129/83 | HR 73

## 2016-07-04 DIAGNOSIS — S93491A Sprain of other ligament of right ankle, initial encounter: Secondary | ICD-10-CM | POA: Diagnosis not present

## 2016-07-04 DIAGNOSIS — Z833 Family history of diabetes mellitus: Secondary | ICD-10-CM | POA: Insufficient documentation

## 2016-07-04 DIAGNOSIS — G43909 Migraine, unspecified, not intractable, without status migrainosus: Secondary | ICD-10-CM | POA: Diagnosis not present

## 2016-07-04 DIAGNOSIS — Z841 Family history of disorders of kidney and ureter: Secondary | ICD-10-CM | POA: Insufficient documentation

## 2016-07-04 DIAGNOSIS — M545 Low back pain, unspecified: Secondary | ICD-10-CM

## 2016-07-04 DIAGNOSIS — E119 Type 2 diabetes mellitus without complications: Secondary | ICD-10-CM | POA: Diagnosis not present

## 2016-07-04 DIAGNOSIS — I10 Essential (primary) hypertension: Secondary | ICD-10-CM | POA: Insufficient documentation

## 2016-07-04 DIAGNOSIS — Z823 Family history of stroke: Secondary | ICD-10-CM | POA: Insufficient documentation

## 2016-07-04 DIAGNOSIS — E1140 Type 2 diabetes mellitus with diabetic neuropathy, unspecified: Secondary | ICD-10-CM | POA: Diagnosis not present

## 2016-07-04 DIAGNOSIS — D649 Anemia, unspecified: Secondary | ICD-10-CM | POA: Insufficient documentation

## 2016-07-04 DIAGNOSIS — Z79891 Long term (current) use of opiate analgesic: Secondary | ICD-10-CM | POA: Diagnosis not present

## 2016-07-04 DIAGNOSIS — G8929 Other chronic pain: Secondary | ICD-10-CM | POA: Insufficient documentation

## 2016-07-04 DIAGNOSIS — G8111 Spastic hemiplegia affecting right dominant side: Secondary | ICD-10-CM | POA: Diagnosis not present

## 2016-07-04 DIAGNOSIS — I6529 Occlusion and stenosis of unspecified carotid artery: Secondary | ICD-10-CM

## 2016-07-04 DIAGNOSIS — Z9889 Other specified postprocedural states: Secondary | ICD-10-CM | POA: Insufficient documentation

## 2016-07-04 DIAGNOSIS — G8110 Spastic hemiplegia affecting unspecified side: Secondary | ICD-10-CM

## 2016-07-04 NOTE — Patient Instructions (Signed)
Please call if back pain gets worse, we may have to call in some physical therapy. Do not think we need to do injections at the current time. Continue tramadol. We'll discontinue the methocarbamol

## 2016-07-04 NOTE — Progress Notes (Signed)
Subjective:    Patient ID: Tina [name], female    DOB: [birth year], 46 y.o.   MRN: [id number]  HPI  Chief complaint is low back pain Mrs. mainly right-sided. Pain is worsened with extension and improved with flexion of the lumbar spine as well as leaning toward the left side.  Had vitrectomy by Dr Zadie Rhine do not have actual notes so unsure of actual underlying pathology., Reports no vision Left eye Lumbar pain radiating into post thigh and one occasion into the RIght foot, may be slightly improving with time Less active over the last month due to eye problems Pain Inventory Average Pain 5 Pain Right Now 5 My pain is sharp, burning, stabbing and aching  In the last 24 hours, has pain interfered with the following? General activity 7 Relation with others 7 Enjoyment of life 7 What TIME of day is your pain at its worst? daytime Sleep (in general) Fair  Pain is worse with: standing Pain improves with: heat/ice, therapy/exercise, pacing activities and medication Relief from Meds: 6  Mobility walk without assistance  Function Do you have any goals in this area?  no  Neuro/Psych spasms dizziness depression anxiety  Prior Studies Any changes since last visit?  no  Physicians involved in your care Any changes since last visit?  no   Family History  Problem Relation Age of Onset  . Diabetes Father   . Kidney disease Father   . Diabetes Maternal Grandmother   . Hyperlipidemia Paternal Grandmother   . Stroke Paternal Grandmother   . Other Neg Hx    Social History   Social History  . Marital status: Married    Spouse name: N/A  . Number of children: 2  . Years of education: BA degree   Occupational History  .  Unemployed   Social History Main Topics  . Smoking status: Never Smoker  . Smokeless tobacco: Never Used  . Alcohol use No  . Drug use: No  . Sexual activity: Yes    Birth control/ protection: None   Other Topics Concern  . Not on file    Social History Narrative   Patient is married with 2 children.   Patient is right handed.   Patient has her  BA degree.   Patient drinks 1 cup daily.   Past Surgical History:  Procedure Laterality Date  . APPLICATION OF WOUND VAC N/A 05/25/2015   Procedure: APPLICATION OF WOUND VAC;  Surgeon: Michael Boston, MD;  [facility]: [facility];  Service: General;  Laterality: N/A;  . CESAREAN SECTION  2012  . DIAGNOSTIC LAPAROSCOPY    . ESOPHAGOGASTRODUODENOSCOPY N/A 06/03/2012   Procedure: ESOPHAGOGASTRODUODENOSCOPY (EGD);  Surgeon: [name] Craver, MD;  Location[facility] ENDOSCOPY;  Service: Endoscopy;  Laterality: N/A;  . EXCISION MASS ABDOMINAL N/A 05/25/2015   Procedure: ABDOMINAL WALL EXPLORATION EXCISION OF SEROMA REMOVAL OF REDUNDANT SKIN ;  Surgeon: Michael Boston, MD;  Location: [facility];  Service: General;  Laterality: N/A;  . EYE SURGERY     Eye laser for vessel hemorrhaging  . HERNIA REPAIR  10/03/11   ventral hernia repair  . INSERTION OF MESH N/A 02/04/2013   Procedure: INSERTION OF MESH;  Surgeon: Adin Hector, MD;  [facility]: [facility];  Service: General;  Laterality: N/A;  . UMBILICAL HERNIA REPAIR N/A 02/04/2013   Procedure: LAPAROSCOPIC ventral wall hernia repair LAPAROSCOPIC LYSIS OF ADHESIONS laparoscopic exploration of abdomen ;  Surgeon: Adin Hector, MD;  Location: [facility];  Service: General;  Laterality:  N/A;  . URETER REVISION     Bilateral "twisted"  . UTERINE FIBROID SURGERY     2 SURGERIES FOR FIBROIDS  . VENTRAL HERNIA REPAIR  10/03/2011   Procedure: LAPAROSCOPIC VENTRAL HERNIA;  Surgeon: Adin Hector, MD;  [facility]: [facility];  Service: General;  Laterality: N/A;   Past Medical History:  Diagnosis Date  . Anemia    DURING MENSES--HAS HEAVY BLEEDING WITH PERODS  . Anxiety   . Back pain, chronic    "ongoing"  . Blood transfusion    IN 2012  AFTER C -SECTION  . Cerebral thrombosis with cerebral infarction [facility]) JUNE 2011   RIGHT SIDED WEAKNESS ( ARM AND LEG ) AND SPASMS-remains  with slight weakness and vertigo.  . Depression   . Diabetes mellitus   . Diabetic neuropathy ([place])    BOTH FEET --COMES AND GOES  . Endometriosis   . Headache(784.0)    MIGRAINES--NOT REALLY HEADACHE-MORE LIKE PRESSURE SENSATION IN HEAD-FEELS DIZZIY AND  FAINT AS THE PRESSURE RESOLVES  . Hernia, incisional, RLQ, s/p lap repair Sep 2013 09/02/2011  . Hx of migraines 10/19/2011  . Hypertension   . Leg pain, right    "like bad Charley horse"  . Panic disorder without agoraphobia   . Rash    HANDS, ARMS --STATES HX OF RASH EVER SINCE CHILDBIRTH/PREGNANCY.  STATES THE RASH OFTEN OCCURS WHEN SHE IS REALLY STRESSED."goes and comes-presently left ring finger"  . Restless leg syndrome    DIAGNOSED BY SLEEP STUDY - PT TOLD SHE DID NOT HAVE SLEEP APNEA  . Right rotator cuff tear    PAIN IN RIGHT SHOULDER  . SBO (small bowel obstruction) ([place]) 06/03/2012  . Spastic hemiplegia affecting dominant side ([place])   . Stroke ([place])   . Ventral hernia    RIGHT LOWER QUADRANT-CAUSING SOME PAIN   There were no vitals taken for this visit.  Opioid Risk Score:   Fall Risk Score:  `1  Depression screen PHQ 2/9  Depression screen PHQ 2/9 03/08/2015  Decreased Interest 2  Down, Depressed, Hopeless 2  PHQ - 2 Score 4  Altered sleeping 3  Tired, decreased energy 3  Change in appetite 1  Feeling bad or failure about yourself  2  Trouble concentrating 3  Moving slowly or fidgety/restless 2  Suicidal thoughts 1  PHQ-9 Score 19  Difficult doing work/chores Somewhat difficult     Review of Systems  Constitutional: Negative.   HENT: Negative.   Eyes: Positive for pain.       Recent eye surgery  Respiratory: Negative.   Cardiovascular: Negative.   Gastrointestinal: Negative.   Endocrine: Negative.   Genitourinary: Negative.   Musculoskeletal: Positive for back pain.  Skin: Negative.   Allergic/Immunologic: Negative.   Neurological: Negative.   Hematological: Negative.   Psychiatric/Behavioral:  Negative.   All other systems reviewed and are negative.      Objective:   Physical Exam  Constitutional: She is oriented to person, place, and time. She appears well-developed and well-nourished.  HENT:  Head: Normocephalic and atraumatic.  Eyes: Conjunctivae and EOM are normal. Pupils are equal, round, and reactive to light.  Neurological: She is alert and oriented to person, place, and time.  Psychiatric: She has a normal mood and affect.  Nursing note and vitals reviewed.   Tenderness over the right gluteus medius origin as well as insertion. Mild tenderness over the PSIS. No tenderness over the lumbar paraspinals. There is pain with lumbar extension and plane with right  lateral bending. Improvement of pain with left lateral bending. Negative straight leg raising bilaterally. Motor strength is 4/5 in the right hip flexor, knee extensor, ankle dorsal flexor 5/5 and left hip flexor, knee extensor, ankle dorsal flexor. Sensation managed over the entire right lower limb which is baseline since CVA      Assessment & Plan:  1. Right-sided low back pain, exam is most consistent with lumbar facet syndrome. She also gets relief with forward flexion but not with extension. I have discussed treatment, overall improving, we'll not schedule lumbar medial branch blocks. Patient has exercises from therapy at home. She will start doing her home exercise program once again.  We'll discontinue methocarbamol. Continue tramadol 50 mg 3 times a day  If she has worsening of pain, would recommend outpatient PT plus minus lumbar medial branch blocks.

## 2016-07-08 DIAGNOSIS — I10 Essential (primary) hypertension: Secondary | ICD-10-CM | POA: Diagnosis not present

## 2016-07-08 DIAGNOSIS — E789 Disorder of lipoprotein metabolism, unspecified: Secondary | ICD-10-CM | POA: Diagnosis not present

## 2016-07-08 DIAGNOSIS — E118 Type 2 diabetes mellitus with unspecified complications: Secondary | ICD-10-CM | POA: Diagnosis not present

## 2016-07-15 DIAGNOSIS — I10 Essential (primary) hypertension: Secondary | ICD-10-CM | POA: Diagnosis not present

## 2016-07-15 DIAGNOSIS — E118 Type 2 diabetes mellitus with unspecified complications: Secondary | ICD-10-CM | POA: Diagnosis not present

## 2016-07-15 DIAGNOSIS — M109 Gout, unspecified: Secondary | ICD-10-CM | POA: Diagnosis not present

## 2016-07-18 DIAGNOSIS — Z09 Encounter for follow-up examination after completed treatment for conditions other than malignant neoplasm: Secondary | ICD-10-CM | POA: Diagnosis not present

## 2016-07-18 DIAGNOSIS — E113512 Type 2 diabetes mellitus with proliferative diabetic retinopathy with macular edema, left eye: Secondary | ICD-10-CM | POA: Diagnosis not present

## 2016-07-18 DIAGNOSIS — H4312 Vitreous hemorrhage, left eye: Secondary | ICD-10-CM | POA: Diagnosis not present

## 2016-07-30 DIAGNOSIS — H2512 Age-related nuclear cataract, left eye: Secondary | ICD-10-CM | POA: Diagnosis not present

## 2016-07-30 DIAGNOSIS — E113593 Type 2 diabetes mellitus with proliferative diabetic retinopathy without macular edema, bilateral: Secondary | ICD-10-CM | POA: Diagnosis not present

## 2016-08-19 DIAGNOSIS — M65331 Trigger finger, right middle finger: Secondary | ICD-10-CM | POA: Diagnosis not present

## 2016-08-21 DIAGNOSIS — H2512 Age-related nuclear cataract, left eye: Secondary | ICD-10-CM | POA: Diagnosis not present

## 2016-09-01 ENCOUNTER — Ambulatory Visit (HOSPITAL_BASED_OUTPATIENT_CLINIC_OR_DEPARTMENT_OTHER): Payer: Federal, State, Local not specified - PPO | Admitting: Physical Medicine & Rehabilitation

## 2016-09-01 ENCOUNTER — Encounter: Payer: Federal, State, Local not specified - PPO | Attending: Physical Medicine & Rehabilitation

## 2016-09-01 ENCOUNTER — Encounter: Payer: Self-pay | Admitting: Physical Medicine & Rehabilitation

## 2016-09-01 VITALS — BP 125/77 | HR 91

## 2016-09-01 DIAGNOSIS — M545 Low back pain, unspecified: Secondary | ICD-10-CM

## 2016-09-01 DIAGNOSIS — G8111 Spastic hemiplegia affecting right dominant side: Secondary | ICD-10-CM | POA: Diagnosis not present

## 2016-09-01 DIAGNOSIS — Z79891 Long term (current) use of opiate analgesic: Secondary | ICD-10-CM | POA: Insufficient documentation

## 2016-09-01 DIAGNOSIS — Z833 Family history of diabetes mellitus: Secondary | ICD-10-CM | POA: Diagnosis not present

## 2016-09-01 DIAGNOSIS — D649 Anemia, unspecified: Secondary | ICD-10-CM | POA: Diagnosis not present

## 2016-09-01 DIAGNOSIS — S93491A Sprain of other ligament of right ankle, initial encounter: Secondary | ICD-10-CM | POA: Diagnosis not present

## 2016-09-01 DIAGNOSIS — E1140 Type 2 diabetes mellitus with diabetic neuropathy, unspecified: Secondary | ICD-10-CM | POA: Diagnosis not present

## 2016-09-01 DIAGNOSIS — Z841 Family history of disorders of kidney and ureter: Secondary | ICD-10-CM | POA: Diagnosis not present

## 2016-09-01 DIAGNOSIS — Z823 Family history of stroke: Secondary | ICD-10-CM | POA: Diagnosis not present

## 2016-09-01 DIAGNOSIS — G8929 Other chronic pain: Secondary | ICD-10-CM | POA: Diagnosis not present

## 2016-09-01 DIAGNOSIS — I10 Essential (primary) hypertension: Secondary | ICD-10-CM | POA: Diagnosis not present

## 2016-09-01 DIAGNOSIS — I6529 Occlusion and stenosis of unspecified carotid artery: Secondary | ICD-10-CM

## 2016-09-01 DIAGNOSIS — Z9889 Other specified postprocedural states: Secondary | ICD-10-CM | POA: Diagnosis not present

## 2016-09-01 DIAGNOSIS — E119 Type 2 diabetes mellitus without complications: Secondary | ICD-10-CM | POA: Diagnosis not present

## 2016-09-01 DIAGNOSIS — G8110 Spastic hemiplegia affecting unspecified side: Secondary | ICD-10-CM | POA: Diagnosis not present

## 2016-09-01 DIAGNOSIS — G43909 Migraine, unspecified, not intractable, without status migrainosus: Secondary | ICD-10-CM | POA: Insufficient documentation

## 2016-09-01 MED ORDER — METHOCARBAMOL 500 MG PO TABS: 500.0000 mg | ORAL_TABLET | Freq: Three times a day (TID) | ORAL | 1 refills | Status: DC

## 2016-09-01 NOTE — Progress Notes (Signed)
Subjective:    Patient ID: Tina [name], female    DOB: 1970/07/14, 46 y.o.   MRN: [id number]  HPI 46 year old female with history of CVA causing chronic right hemiparesis. In addition she has chronic low back pain, which was doing well up until about 6-8 weeks ago when she suffered an exacerbation. She has right sided numbness which is chronic. This is affecting her upper and lower limb. She has had no new weakness, no new bowel or bladder issues. She had some improvement with muscle relaxers and last visit was discontinued. However, she thinks her pain is flared up again. No prior Physical therapy for her low back. She has not had any injections recently. Pain Inventory Average Pain 6 Pain Right Now 6 My pain is sharp and stabbing  In the last 24 hours, has pain interfered with the following? General activity . Relation with others . Enjoyment of life . What TIME of day is your pain at its worst? evening Sleep (in general) Fair  Pain is worse with: . Pain improves with: medication Relief from Meds: 6  Mobility walk without assistance  Function disabled: date disabled .  Neuro/Psych numbness tingling spasms dizziness depression anxiety  Prior Studies Any changes since last visit?  no  Physicians involved in your care Any changes since last visit?  no   Family History  Problem Relation Age of Onset  . Diabetes Father   . Kidney disease Father   . Diabetes Maternal Grandmother   . Hyperlipidemia Paternal Grandmother   . Stroke Paternal Grandmother   . Other Neg Hx    Social History   Social History  . Marital status: Married    Spouse name: N/A  . Number of children: 2  . Years of education: BA degree   Occupational History  .  Unemployed   Social History Main Topics  . Smoking status: Never Smoker  . Smokeless tobacco: Never Used  . Alcohol use No  . Drug use: No  . Sexual activity: Yes    Birth control/ protection: None   Other Topics  Concern  . Not on file   Social History Narrative   Patient is married with 2 children.   Patient is right handed.   Patient has her  BA degree.   Patient drinks 1 cup daily.   Past Surgical History:  Procedure Laterality Date  . APPLICATION OF WOUND VAC N/A 05/25/2015   Procedure: APPLICATION OF WOUND VAC;  Surgeon: Michael Boston, MD;  Location: [facility];  Service: General;  Laterality: N/A;  . CESAREAN SECTION  2012  . DIAGNOSTIC LAPAROSCOPY    . ESOPHAGOGASTRODUODENOSCOPY N/A 06/03/2012   Procedure: ESOPHAGOGASTRODUODENOSCOPY (EGD);  Surgeon: [name] Craver, MD;  Location: [facility] ENDOSCOPY;  Service: Endoscopy;  Laterality: N/A;  . EXCISION MASS ABDOMINAL N/A 05/25/2015   Procedure: ABDOMINAL WALL EXPLORATION EXCISION OF SEROMA REMOVAL OF REDUNDANT SKIN ;  Surgeon: Michael Boston, MD;  Location: [facility];  Service: General;  Laterality: N/A;  . EYE SURGERY     Eye laser for vessel hemorrhaging  . HERNIA REPAIR  10/03/11   ventral hernia repair  . INSERTION OF MESH N/A 02/04/2013   Procedure: INSERTION OF MESH;  Surgeon: Adin Hector, MD;  Location: [facility];  Service: General;  Laterality: N/A;  . UMBILICAL HERNIA REPAIR N/A 02/04/2013   Procedure: LAPAROSCOPIC ventral wall hernia repair LAPAROSCOPIC LYSIS OF ADHESIONS laparoscopic exploration of abdomen ;  Surgeon: Adin Hector, MD;  Location: [facility];  Service: General;  Laterality: N/A;  . URETER REVISION     Bilateral "twisted"  . UTERINE FIBROID SURGERY     2 SURGERIES FOR FIBROIDS  . VENTRAL HERNIA REPAIR  10/03/2011   Procedure: LAPAROSCOPIC VENTRAL HERNIA;  Surgeon: Adin Hector, MD;  Location: [facility];  Service: General;  Laterality: N/A;   Past Medical History:  Diagnosis Date  . Anemia    DURING MENSES--HAS HEAVY BLEEDING WITH PERODS  . Anxiety   . Back pain, chronic    "ongoing"  . Blood transfusion    IN 2012  AFTER C -SECTION  . Cerebral thrombosis with cerebral infarction [facility]) JUNE 2011   RIGHT SIDED WEAKNESS ( ARM AND  LEG ) AND SPASMS-remains with slight weakness and vertigo.  . Depression   . Diabetes mellitus   . Diabetic neuropathy ([place])    BOTH FEET --COMES AND GOES  . Endometriosis   . H/O eye surgery   . Headache(784.0)    MIGRAINES--NOT REALLY HEADACHE-MORE LIKE PRESSURE SENSATION IN HEAD-FEELS DIZZIY AND  FAINT AS THE PRESSURE RESOLVES  . Hernia, incisional, RLQ, s/p lap repair Sep 2013 09/02/2011  . Hx of migraines 10/19/2011  . Hypertension   . Leg pain, right    "like bad Charley horse"  . Panic disorder without agoraphobia   . Rash    HANDS, ARMS --STATES HX OF RASH EVER SINCE CHILDBIRTH/PREGNANCY.  STATES THE RASH OFTEN OCCURS WHEN SHE IS REALLY STRESSED."goes and comes-presently left ring finger"  . Restless leg syndrome    DIAGNOSED BY SLEEP STUDY - PT TOLD SHE DID NOT HAVE SLEEP APNEA  . Right rotator cuff tear    PAIN IN RIGHT SHOULDER  . SBO (small bowel obstruction) ([place]) 06/03/2012  . Spastic hemiplegia affecting dominant side ([place])   . Stroke ([place])   . Ventral hernia    RIGHT LOWER QUADRANT-CAUSING SOME PAIN   There were no vitals taken for this visit.  Opioid Risk Score:   Fall Risk Score:  `1  Depression screen PHQ 2/9  Depression screen PHQ 2/9 03/08/2015  Decreased Interest 2  Down, Depressed, Hopeless 2  PHQ - 2 Score 4  Altered sleeping 3  Tired, decreased energy 3  Change in appetite 1  Feeling bad or failure about yourself  2  Trouble concentrating 3  Moving slowly or fidgety/restless 2  Suicidal thoughts 1  PHQ-9 Score 19  Difficult doing work/chores Somewhat difficult     Review of Systems  Constitutional: Negative.   HENT: Negative.   Eyes: Negative.   Respiratory: Negative.   Cardiovascular: Negative.   Gastrointestinal: Negative.   Endocrine: Negative.   Genitourinary: Negative.   Musculoskeletal: Negative.   Skin: Negative.   Allergic/Immunologic: Negative.   Neurological: Negative.   Hematological: Negative.   Psychiatric/Behavioral:  Negative.   All other systems reviewed and are negative.      Objective:   Physical Exam  Constitutional: She is oriented to person, place, and time. She appears well-developed and well-nourished.  HENT:  Head: Normocephalic and atraumatic.  Eyes: Pupils are equal, round, and reactive to light. Conjunctivae and EOM are normal.  Neurological: She is alert and oriented to person, place, and time. A sensory deficit is present. Coordination and gait abnormal.  Reflex Scores:      Tricep reflexes are 3+ on the right side and 2+ on the left side.      Bicep reflexes are 3+ on the right side and 2+ on the left side.  Brachioradialis reflexes are 3+ on the right side and 2+ on the left side.      Patellar reflexes are 3+ on the right side and 2+ on the left side.      Achilles reflexes are 3+ on the right side and 2+ on the left side. Motor strength is 4/5 in the right deltoid by suppressive grip, hip flexor, knee extensor, ankle dorsiflexor, 5/5 in left deltoid, biceps, triceps, grip, hip flexion, extension, ankle dorsi flexor  Decreased fine motor right upper extremity  Decrease sensation to light touch and pinprick in the right foot and ankle area as well as leg Also reduced sensation. Right upper extremity  Psychiatric: She has a normal mood and affect.  Nursing note and vitals reviewed.         Assessment & Plan:  1. Acute exacerbation of chronic low back pain, do think she may benefit from outpatient physical therapy, I think some of her pain is related to her gait alteration. As result of her chronic right hemiparesis. This may be more of a sacroiliac issue, although I cannot definitively rule out degenerative disc with radicular symptoms. Given concomitant right lower extremity numbness, weakness from CVA.  Tramadol 2 mg 3 times a day Resume robaxin 500mg  3 times a day,prn  2. Spasticity related CVA. Continue Zanaflex 2 mg daily at bedtime May repeat times 1 with 1 mg if she  wakes up with spasms

## 2016-09-04 DIAGNOSIS — F331 Major depressive disorder, recurrent, moderate: Secondary | ICD-10-CM | POA: Diagnosis not present

## 2016-09-04 DIAGNOSIS — F411 Generalized anxiety disorder: Secondary | ICD-10-CM | POA: Diagnosis not present

## 2016-09-08 DIAGNOSIS — E113591 Type 2 diabetes mellitus with proliferative diabetic retinopathy without macular edema, right eye: Secondary | ICD-10-CM | POA: Diagnosis not present

## 2016-09-08 DIAGNOSIS — H4312 Vitreous hemorrhage, left eye: Secondary | ICD-10-CM | POA: Diagnosis not present

## 2016-09-08 DIAGNOSIS — E113532 Type 2 diabetes mellitus with proliferative diabetic retinopathy with traction retinal detachment not involving the macula, left eye: Secondary | ICD-10-CM | POA: Diagnosis not present

## 2016-09-09 ENCOUNTER — Other Ambulatory Visit: Payer: Self-pay | Admitting: Physical Medicine & Rehabilitation

## 2016-09-11 DIAGNOSIS — E118 Type 2 diabetes mellitus with unspecified complications: Secondary | ICD-10-CM | POA: Diagnosis not present

## 2016-09-11 DIAGNOSIS — E789 Disorder of lipoprotein metabolism, unspecified: Secondary | ICD-10-CM | POA: Diagnosis not present

## 2016-09-11 DIAGNOSIS — I10 Essential (primary) hypertension: Secondary | ICD-10-CM | POA: Diagnosis not present

## 2016-09-11 DIAGNOSIS — M109 Gout, unspecified: Secondary | ICD-10-CM | POA: Diagnosis not present

## 2016-09-16 DIAGNOSIS — H21222 Degeneration of ciliary body, left eye: Secondary | ICD-10-CM | POA: Diagnosis not present

## 2016-09-16 DIAGNOSIS — G44009 Cluster headache syndrome, unspecified, not intractable: Secondary | ICD-10-CM | POA: Diagnosis not present

## 2016-09-16 DIAGNOSIS — H4312 Vitreous hemorrhage, left eye: Secondary | ICD-10-CM | POA: Diagnosis not present

## 2016-09-24 DIAGNOSIS — E113532 Type 2 diabetes mellitus with proliferative diabetic retinopathy with traction retinal detachment not involving the macula, left eye: Secondary | ICD-10-CM | POA: Diagnosis not present

## 2016-09-24 DIAGNOSIS — H4312 Vitreous hemorrhage, left eye: Secondary | ICD-10-CM | POA: Diagnosis not present

## 2016-10-03 DIAGNOSIS — E113532 Type 2 diabetes mellitus with proliferative diabetic retinopathy with traction retinal detachment not involving the macula, left eye: Secondary | ICD-10-CM | POA: Diagnosis not present

## 2016-10-03 DIAGNOSIS — Z09 Encounter for follow-up examination after completed treatment for conditions other than malignant neoplasm: Secondary | ICD-10-CM | POA: Diagnosis not present

## 2016-10-03 DIAGNOSIS — E113591 Type 2 diabetes mellitus with proliferative diabetic retinopathy without macular edema, right eye: Secondary | ICD-10-CM | POA: Diagnosis not present

## 2016-10-10 ENCOUNTER — Ambulatory Visit (HOSPITAL_BASED_OUTPATIENT_CLINIC_OR_DEPARTMENT_OTHER): Payer: Federal, State, Local not specified - PPO | Admitting: Physical Medicine & Rehabilitation

## 2016-10-10 ENCOUNTER — Encounter: Payer: Self-pay | Admitting: Physical Medicine & Rehabilitation

## 2016-10-10 ENCOUNTER — Encounter: Payer: Federal, State, Local not specified - PPO | Attending: Physical Medicine & Rehabilitation

## 2016-10-10 VITALS — BP 124/76 | HR 80

## 2016-10-10 DIAGNOSIS — E119 Type 2 diabetes mellitus without complications: Secondary | ICD-10-CM | POA: Diagnosis not present

## 2016-10-10 DIAGNOSIS — G8111 Spastic hemiplegia affecting right dominant side: Secondary | ICD-10-CM | POA: Diagnosis not present

## 2016-10-10 DIAGNOSIS — I69351 Hemiplegia and hemiparesis following cerebral infarction affecting right dominant side: Secondary | ICD-10-CM

## 2016-10-10 DIAGNOSIS — D649 Anemia, unspecified: Secondary | ICD-10-CM | POA: Insufficient documentation

## 2016-10-10 DIAGNOSIS — E1140 Type 2 diabetes mellitus with diabetic neuropathy, unspecified: Secondary | ICD-10-CM | POA: Insufficient documentation

## 2016-10-10 DIAGNOSIS — I10 Essential (primary) hypertension: Secondary | ICD-10-CM | POA: Insufficient documentation

## 2016-10-10 DIAGNOSIS — M259 Joint disorder, unspecified: Secondary | ICD-10-CM | POA: Diagnosis not present

## 2016-10-10 DIAGNOSIS — M545 Low back pain: Secondary | ICD-10-CM | POA: Diagnosis not present

## 2016-10-10 DIAGNOSIS — S93491A Sprain of other ligament of right ankle, initial encounter: Secondary | ICD-10-CM | POA: Insufficient documentation

## 2016-10-10 DIAGNOSIS — G8929 Other chronic pain: Secondary | ICD-10-CM | POA: Diagnosis not present

## 2016-10-10 DIAGNOSIS — Z79891 Long term (current) use of opiate analgesic: Secondary | ICD-10-CM | POA: Diagnosis not present

## 2016-10-10 DIAGNOSIS — G43909 Migraine, unspecified, not intractable, without status migrainosus: Secondary | ICD-10-CM | POA: Diagnosis not present

## 2016-10-10 DIAGNOSIS — Z841 Family history of disorders of kidney and ureter: Secondary | ICD-10-CM | POA: Insufficient documentation

## 2016-10-10 DIAGNOSIS — Z9889 Other specified postprocedural states: Secondary | ICD-10-CM | POA: Insufficient documentation

## 2016-10-10 DIAGNOSIS — Z833 Family history of diabetes mellitus: Secondary | ICD-10-CM | POA: Insufficient documentation

## 2016-10-10 DIAGNOSIS — Z823 Family history of stroke: Secondary | ICD-10-CM | POA: Insufficient documentation

## 2016-10-10 DIAGNOSIS — I6529 Occlusion and stenosis of unspecified carotid artery: Secondary | ICD-10-CM

## 2016-10-10 MED ORDER — TRAMADOL HCL 50 MG PO TABS: 50.0000 mg | ORAL_TABLET | Freq: Two times a day (BID) | ORAL | 5 refills | Status: DC | PRN

## 2016-10-10 NOTE — Progress Notes (Signed)
Subjective:    Patient ID: Tina [name], female    DOB: 18-Sep-1970, 46 y.o.   MRN: [id number]  HPI  Had eye surgery with Dr Zadie Rhine Left with vitrectomy  Low back pain more constant, Patient cannot identify which side is bothering her the most. She has no pain that radiates into the lower extremity. She is able to walk and do all her self-care, mobility, as well as take care of her children. Pain Inventory Average Pain 7 Pain Right Now 6 My pain is burning, stabbing and aching  In the last 24 hours, has pain interfered with the following? General activity 8 Relation with others 8 Enjoyment of life 7 What TIME of day is your pain at its worst? all Sleep (in general) Fair  Pain is worse with: walking, bending, standing and some activites Pain improves with: heat/ice Relief from Meds: 6  Mobility walk without assistance  Function disabled: date disabled 2013  Neuro/Psych weakness numbness tingling dizziness confusion depression anxiety  Prior Studies Any changes since last visit?  no  Physicians involved in your care Any changes since last visit?  no   Family History  Problem Relation Age of Onset  . Diabetes Father   . Kidney disease Father   . Diabetes Maternal Grandmother   . Hyperlipidemia Paternal Grandmother   . Stroke Paternal Grandmother   . Other Neg Hx    Social History   Social History  . Marital status: Married    Spouse name: N/A  . Number of children: 2  . Years of education: BA degree   Occupational History  .  Unemployed   Social History Main Topics  . Smoking status: Never Smoker  . Smokeless tobacco: Never Used  . Alcohol use No  . Drug use: No  . Sexual activity: Yes    Birth control/ protection: None   Other Topics Concern  . Not on file   Social History Narrative   Patient is married with 2 children.   Patient is right handed.   Patient has her  BA degree.   Patient drinks 1 cup daily.   Past Surgical  History:  Procedure Laterality Date  . APPLICATION OF WOUND VAC N/A 05/25/2015   Procedure: APPLICATION OF WOUND VAC;  Surgeon: Michael Boston, MD;  [facility]: [facility];  Service: General;  Laterality: N/A;  . CESAREAN SECTION  2012  . DIAGNOSTIC LAPAROSCOPY    . ESOPHAGOGASTRODUODENOSCOPY N/A 06/03/2012   Procedure: ESOPHAGOGASTRODUODENOSCOPY (EGD);  Surgeon: [name] Craver, MD;  Location: [facility] ENDOSCOPY;  Service: Endoscopy;  Laterality: N/A;  . EXCISION MASS ABDOMINAL N/A 05/25/2015   Procedure: ABDOMINAL WALL EXPLORATION EXCISION OF SEROMA REMOVAL OF REDUNDANT SKIN ;  Surgeon: Michael Boston, MD;  Location: [facility];  Service: General;  Laterality: N/A;  . EYE SURGERY     Eye laser for vessel hemorrhaging  . HERNIA REPAIR  10/03/11   ventral hernia repair  . INSERTION OF MESH N/A 02/04/2013   Procedure: INSERTION OF MESH;  Surgeon: Adin Hector, MD;  [facility];  Service: General;  Laterality: N/A;  . UMBILICAL HERNIA REPAIR N/A 02/04/2013   Procedure: LAPAROSCOPIC ventral wall hernia repair LAPAROSCOPIC LYSIS OF ADHESIONS laparoscopic exploration of abdomen ;  Surgeon: Adin Hector, MD;  Location: [facility];  Service: General;  Laterality: N/A;  . URETER REVISION     Bilateral "twisted"  . UTERINE FIBROID SURGERY     2 SURGERIES FOR FIBROIDS  . VENTRAL HERNIA REPAIR  10/03/2011  Procedure: LAPAROSCOPIC VENTRAL HERNIA;  Surgeon: Adin Hector, MD;  Location: [facility];  Service: General;  Laterality: N/A;   Past Medical History:  Diagnosis Date  . Anemia    DURING MENSES--HAS HEAVY BLEEDING WITH PERODS  . Anxiety   . Back pain, chronic    "ongoing"  . Blood transfusion    IN 2012  AFTER C -SECTION  . Cerebral thrombosis with cerebral infarction [facility]) JUNE 2011   RIGHT SIDED WEAKNESS ( ARM AND LEG ) AND SPASMS-remains with slight weakness and vertigo.  . Depression   . Diabetes mellitus   . Diabetic neuropathy ([place])    BOTH FEET --COMES AND GOES  . Endometriosis   . H/O eye surgery     . Headache(784.0)    MIGRAINES--NOT REALLY HEADACHE-MORE LIKE PRESSURE SENSATION IN HEAD-FEELS DIZZIY AND  FAINT AS THE PRESSURE RESOLVES  . Hernia, incisional, RLQ, s/p lap repair Sep 2013 09/02/2011  . Hx of migraines 10/19/2011  . Hypertension   . Leg pain, right    "like bad Charley horse"  . Panic disorder without agoraphobia   . Rash    HANDS, ARMS --STATES HX OF RASH EVER SINCE CHILDBIRTH/PREGNANCY.  STATES THE RASH OFTEN OCCURS WHEN SHE IS REALLY STRESSED."goes and comes-presently left ring finger"  . Restless leg syndrome    DIAGNOSED BY SLEEP STUDY - PT TOLD SHE DID NOT HAVE SLEEP APNEA  . Right rotator cuff tear    PAIN IN RIGHT SHOULDER  . SBO (small bowel obstruction) ([place]) 06/03/2012  . Spastic hemiplegia affecting dominant side ([place])   . Stroke ([place])   . Ventral hernia    RIGHT LOWER QUADRANT-CAUSING SOME PAIN   There were no vitals taken for this visit.  Opioid Risk Score:   Fall Risk Score:  `1  Depression screen PHQ 2/9  Depression screen PHQ 2/9 03/08/2015  Decreased Interest 2  Down, Depressed, Hopeless 2  PHQ - 2 Score 4  Altered sleeping 3  Tired, decreased energy 3  Change in appetite 1  Feeling bad or failure about yourself  2  Trouble concentrating 3  Moving slowly or fidgety/restless 2  Suicidal thoughts 1  PHQ-9 Score 19  Difficult doing work/chores Somewhat difficult     Review of Systems  Constitutional: Negative.   HENT: Negative.   Eyes: Negative.   Respiratory: Negative.   Cardiovascular: Negative.   Gastrointestinal: Negative.   Endocrine: Negative.   Genitourinary: Negative.   Musculoskeletal: Negative.   Skin: Negative.   Allergic/Immunologic: Negative.   Neurological: Negative.   Hematological: Negative.   Psychiatric/Behavioral: Negative.   All other systems reviewed and are negative.      Objective:   Physical Exam  Constitutional: She is oriented to person, place, and time. She appears well-developed and  well-nourished.  HENT:  Head: Normocephalic and atraumatic.  Eyes: Pupils are equal, round, and reactive to light. Conjunctivae and EOM are normal.  Neck: Normal range of motion.  Musculoskeletal:       Lumbar back: She exhibits decreased range of motion and tenderness. She exhibits no deformity and no spasm.  Patient has 75% lumbar flexion, 100%. Lateral bending to the left and to the right, 25% extension, bending toward the left and right cause pain.  Negative straight leg raising [place] refers pain to the sacroiliac area bilaterally. This is mainly with testing of the right hip. She does have decreased external rotation of the right hip  Motor strength is 4/5 in the right deltoid, biceps,  triceps, grip, hip flexor, knee extensor, ankle dorsiflexor 5/5 and left hip flexor, knee extensor, ankle dorsi flexor, deltoid, bicep, tricep  Ambulates without assistive device. No evidence to drag or knee instability  Neurological: She is alert and oriented to person, place, and time.  Skin: Skin is warm and dry.  Psychiatric: She has a normal mood and affect.  Nursing note and vitals reviewed.         Assessment & Plan:  1. Sacroiliac disorder, likely related to her hemiplegic gait pattern, stifflegged gait, decreased step length on the right side.  We'll schedule for sacroiliac injection, bilateral. Referral to outpatient physical therapy for sacroiliac mobilization plus working on gait pattern.  Continue tramadol 1 tablet twice a day Continue tizanidine 1 tablet at night, 1 half tablet during the day

## 2016-10-10 NOTE — Patient Instructions (Signed)
I think your main issue with your back is the sacroiliac joint, have made referral to physical therapy. They will contact you, we'll do sacroiliac joint injection under x-ray guidance.

## 2016-10-29 DIAGNOSIS — F411 Generalized anxiety disorder: Secondary | ICD-10-CM | POA: Diagnosis not present

## 2016-10-29 DIAGNOSIS — F331 Major depressive disorder, recurrent, moderate: Secondary | ICD-10-CM | POA: Diagnosis not present

## 2016-10-29 DIAGNOSIS — H26492 Other secondary cataract, left eye: Secondary | ICD-10-CM | POA: Diagnosis not present

## 2016-10-31 ENCOUNTER — Encounter: Payer: Federal, State, Local not specified - PPO | Attending: Physical Medicine & Rehabilitation

## 2016-10-31 ENCOUNTER — Encounter: Payer: Self-pay | Admitting: Physical Medicine & Rehabilitation

## 2016-10-31 ENCOUNTER — Ambulatory Visit (HOSPITAL_BASED_OUTPATIENT_CLINIC_OR_DEPARTMENT_OTHER): Payer: Federal, State, Local not specified - PPO | Admitting: Physical Medicine & Rehabilitation

## 2016-10-31 VITALS — BP 123/80 | HR 90

## 2016-10-31 DIAGNOSIS — S93491A Sprain of other ligament of right ankle, initial encounter: Secondary | ICD-10-CM | POA: Insufficient documentation

## 2016-10-31 DIAGNOSIS — Z9889 Other specified postprocedural states: Secondary | ICD-10-CM | POA: Insufficient documentation

## 2016-10-31 DIAGNOSIS — D649 Anemia, unspecified: Secondary | ICD-10-CM | POA: Diagnosis not present

## 2016-10-31 DIAGNOSIS — M545 Low back pain: Secondary | ICD-10-CM | POA: Insufficient documentation

## 2016-10-31 DIAGNOSIS — G43909 Migraine, unspecified, not intractable, without status migrainosus: Secondary | ICD-10-CM | POA: Insufficient documentation

## 2016-10-31 DIAGNOSIS — Z833 Family history of diabetes mellitus: Secondary | ICD-10-CM | POA: Diagnosis not present

## 2016-10-31 DIAGNOSIS — E119 Type 2 diabetes mellitus without complications: Secondary | ICD-10-CM | POA: Diagnosis not present

## 2016-10-31 DIAGNOSIS — Z823 Family history of stroke: Secondary | ICD-10-CM | POA: Diagnosis not present

## 2016-10-31 DIAGNOSIS — E1140 Type 2 diabetes mellitus with diabetic neuropathy, unspecified: Secondary | ICD-10-CM | POA: Diagnosis not present

## 2016-10-31 DIAGNOSIS — Z841 Family history of disorders of kidney and ureter: Secondary | ICD-10-CM | POA: Insufficient documentation

## 2016-10-31 DIAGNOSIS — M259 Joint disorder, unspecified: Secondary | ICD-10-CM | POA: Diagnosis not present

## 2016-10-31 DIAGNOSIS — Z79891 Long term (current) use of opiate analgesic: Secondary | ICD-10-CM | POA: Diagnosis not present

## 2016-10-31 DIAGNOSIS — G8929 Other chronic pain: Secondary | ICD-10-CM | POA: Insufficient documentation

## 2016-10-31 DIAGNOSIS — I10 Essential (primary) hypertension: Secondary | ICD-10-CM | POA: Diagnosis not present

## 2016-10-31 DIAGNOSIS — G8111 Spastic hemiplegia affecting right dominant side: Secondary | ICD-10-CM | POA: Insufficient documentation

## 2016-10-31 DIAGNOSIS — M533 Sacrococcygeal disorders, not elsewhere classified: Secondary | ICD-10-CM | POA: Diagnosis not present

## 2016-10-31 NOTE — Progress Notes (Signed)

## 2016-10-31 NOTE — Patient Instructions (Signed)
Sacroiliac injection was performed today. A combination of a naming medicine plus a cortisone medicine was injected. The injection was done under x-ray guidance. This procedure has been performed to help reduce low back and buttocks pain as well as potentially hip pain. The duration of this injection is variable lasting from hours to  Months. It may repeated if needed. 

## 2016-10-31 NOTE — Progress Notes (Signed)
  PROCEDURE RECORD [facility]   Name: COSETTE PRINDLE DOB:December 09, 1970 MRN: [id number]  Date:10/31/2016  Physician: Alysia Penna, MD    Nurse/CMA: [name] Lienau, CMA  Allergies:  Allergies  Allergen Reactions  . Contrast Media [Iodinated Diagnostic Agents] Other (See Comments)    Difficulty breathing  . Iohexol Hives, Nausea And Vomiting and Swelling     Desc: Magnevist-gadolinium-difficulty breathing, throat swelling   . Midazolam Hcl Anaphylaxis    Difficulty breathing  . Shellfish Allergy Anaphylaxis  . Metformin And Related Diarrhea and Nausea And Vomiting  . Other Itching    Patient is allergic to all nuts except peanuts.   . Avandia [Rosiglitazone Maleate] Hives and Other (See Comments)  . Geodon [Ziprasidone] Other (See Comments)    UNKNOWN  . Kiwi Extract Itching and Swelling  . Latex Itching    Consent Signed: Yes.    Is patient diabetic? Yes.    CBG today? 52  Pregnant: No. LMP: No LMP recorded. (age 42-55)  Anticoagulants: yes (aspirin 81 mg) Anti-inflammatory: no Antibiotics: no  Procedure: bilateral sacroiliac steroid injection  Position: Prone Start Time: 11:06am  End Time: 11:14am  Fluoro Time: 33  RN/CMA Reese Stockman, CMA Kyre Jeffries, CMA    Time 10:55am 11:18am    BP 123/80 122/77    Pulse 90 89    Respirations 14 14    O2 Sat 95 95    S/S 6 6    Pain Level 7/10      D/C home with husband, patient A & O X 3, D/C instructions reviewed, and sits independently.

## 2016-11-07 ENCOUNTER — Other Ambulatory Visit: Payer: Self-pay | Admitting: Physical Medicine & Rehabilitation

## 2016-11-07 DIAGNOSIS — H5052 Exophoria: Secondary | ICD-10-CM | POA: Diagnosis not present

## 2016-11-07 DIAGNOSIS — H4311 Vitreous hemorrhage, right eye: Secondary | ICD-10-CM | POA: Diagnosis not present

## 2016-11-07 DIAGNOSIS — E113592 Type 2 diabetes mellitus with proliferative diabetic retinopathy without macular edema, left eye: Secondary | ICD-10-CM | POA: Diagnosis not present

## 2016-11-07 DIAGNOSIS — E113591 Type 2 diabetes mellitus with proliferative diabetic retinopathy without macular edema, right eye: Secondary | ICD-10-CM | POA: Diagnosis not present

## 2016-11-25 DIAGNOSIS — H2511 Age-related nuclear cataract, right eye: Secondary | ICD-10-CM | POA: Diagnosis not present

## 2016-11-26 ENCOUNTER — Emergency Department (HOSPITAL_COMMUNITY): Payer: Federal, State, Local not specified - PPO

## 2016-11-26 ENCOUNTER — Encounter (HOSPITAL_COMMUNITY): Payer: Self-pay | Admitting: *Deleted

## 2016-11-26 ENCOUNTER — Emergency Department (HOSPITAL_COMMUNITY)
Admission: EM | Admit: 2016-11-26 | Discharge: 2016-11-26 | Disposition: A | Discharge status: Home / Self Care | Payer: Federal, State, Local not specified - PPO | Attending: Emergency Medicine | Admitting: Emergency Medicine

## 2016-11-26 ENCOUNTER — Other Ambulatory Visit: Payer: Self-pay

## 2016-11-26 DIAGNOSIS — Z794 Long term (current) use of insulin: Secondary | ICD-10-CM | POA: Diagnosis not present

## 2016-11-26 DIAGNOSIS — R1031 Right lower quadrant pain: Secondary | ICD-10-CM

## 2016-11-26 DIAGNOSIS — R110 Nausea: Secondary | ICD-10-CM | POA: Diagnosis not present

## 2016-11-26 DIAGNOSIS — I10 Essential (primary) hypertension: Secondary | ICD-10-CM | POA: Insufficient documentation

## 2016-11-26 DIAGNOSIS — K760 Fatty (change of) liver, not elsewhere classified: Secondary | ICD-10-CM | POA: Diagnosis not present

## 2016-11-26 DIAGNOSIS — Z8673 Personal history of transient ischemic attack (TIA), and cerebral infarction without residual deficits: Secondary | ICD-10-CM | POA: Insufficient documentation

## 2016-11-26 DIAGNOSIS — Z79899 Other long term (current) drug therapy: Secondary | ICD-10-CM | POA: Insufficient documentation

## 2016-11-26 DIAGNOSIS — E119 Type 2 diabetes mellitus without complications: Secondary | ICD-10-CM | POA: Insufficient documentation

## 2016-11-26 DIAGNOSIS — R10821 Right upper quadrant rebound abdominal tenderness: Secondary | ICD-10-CM | POA: Diagnosis not present

## 2016-11-26 DIAGNOSIS — Z7982 Long term (current) use of aspirin: Secondary | ICD-10-CM | POA: Insufficient documentation

## 2016-11-26 DIAGNOSIS — R109 Unspecified abdominal pain: Secondary | ICD-10-CM | POA: Diagnosis not present

## 2016-11-26 DIAGNOSIS — K2970 Gastritis, unspecified, without bleeding: Secondary | ICD-10-CM | POA: Diagnosis not present

## 2016-11-26 DIAGNOSIS — Z91040 Latex allergy status: Secondary | ICD-10-CM | POA: Diagnosis not present

## 2016-11-26 LAB — COMPREHENSIVE METABOLIC PANEL
ALBUMIN: 3.2 g/dL — AB (ref 3.5–5.0)
ALK PHOS: 48 U/L (ref 38–126)
ALT: 12 U/L — AB (ref 14–54)
AST: 15 U/L (ref 15–41)
Anion gap: 7 (ref 5–15)
BILIRUBIN TOTAL: 0.7 mg/dL (ref 0.3–1.2)
BUN: 10 mg/dL (ref 6–20)
CO2: 24 mmol/L (ref 22–32)
CREATININE: 0.8 mg/dL (ref 0.44–1.00)
Calcium: 8.9 mg/dL (ref 8.9–10.3)
Chloride: 106 mmol/L (ref 101–111)
GFR calc Af Amer: >60 mL/min (ref >60)
GLUCOSE: 191 mg/dL — AB (ref 65–99)
POTASSIUM: 4 mmol/L (ref 3.5–5.1)
Sodium: 137 mmol/L (ref 135–145)
TOTAL PROTEIN: 7.1 g/dL (ref 6.5–8.1)

## 2016-11-26 LAB — URINALYSIS, ROUTINE W REFLEX MICROSCOPIC
BILIRUBIN URINE: NEGATIVE
Glucose, UA: 50 mg/dL — AB
Ketones, ur: NEGATIVE mg/dL
LEUKOCYTES UA: NEGATIVE
NITRITE: NEGATIVE
PH: 5 (ref 5.0–8.0)
Protein, ur: 30 mg/dL — AB
SPECIFIC GRAVITY, URINE: 1.014 (ref 1.005–1.030)

## 2016-11-26 LAB — CBC
HEMATOCRIT: 34.8 % — AB (ref 36.0–46.0)
Hemoglobin: 11.3 g/dL — ABNORMAL LOW (ref 12.0–15.0)
MCH: 26.6 pg (ref 26.0–34.0)
MCHC: 32.5 g/dL (ref 30.0–36.0)
MCV: 81.9 fL (ref 78.0–100.0)
PLATELETS: 324 10*3/uL (ref 150–400)
RBC: 4.25 MIL/uL (ref 3.87–5.11)
RDW: 15.3 % (ref 11.5–15.5)
WBC: 6.5 10*3/uL (ref 4.0–10.5)

## 2016-11-26 LAB — I-STAT BETA HCG BLOOD, ED (MC, WL, AP ONLY)

## 2016-11-26 LAB — LIPASE, BLOOD: Lipase: 33 U/L (ref 11–51)

## 2016-11-26 MED ORDER — HYDROMORPHONE HCL 1 MG/ML IJ SOLN
0.5000 mg | Freq: Once | INTRAMUSCULAR | Status: AC
  Administered 2016-11-26: 0.5 mg via INTRAVENOUS
  Filled 2016-11-26: qty 1

## 2016-11-26 MED ORDER — ONDANSETRON HCL 4 MG/2ML IJ SOLN
4.0000 mg | Freq: Once | INTRAMUSCULAR | Status: AC
  Administered 2016-11-26: 4 mg via INTRAVENOUS
  Filled 2016-11-26: qty 2

## 2016-11-26 MED ORDER — ONDANSETRON HCL 4 MG PO TABS: 4.0000 mg | ORAL_TABLET | Freq: Three times a day (TID) | ORAL | 0 refills | Status: DC | PRN

## 2016-11-26 MED ORDER — FENTANYL CITRATE (PF) 100 MCG/2ML IJ SOLN
50.0000 ug | Freq: Once | INTRAMUSCULAR | Status: AC
  Administered 2016-11-26: 50 ug via INTRAVENOUS
  Filled 2016-11-26: qty 2

## 2016-11-26 MED ORDER — BARIUM SULFATE 2.1 % PO SUSP
ORAL | Status: AC
  Filled 2016-11-26: qty 2

## 2016-11-26 NOTE — ED Provider Notes (Signed)
[facility] EMERGENCY DEPARTMENT Provider Note   CSN: [id number] Arrival date & time: 11/26/16  1028     History   Chief Complaint Chief Complaint  Patient presents with  . Abdominal Pain    HPI Tina [name] is a 46 y.o. female with a hx of multiple hernia repairs, SBO, endometriosis, HTN, and T2DM presents to the ED with complaint of RLQ abdominal pain x 1 week. Patient relays pain started as intermittent and has been gradually worsening progressing to constant. Describes the pain as burning, stabbing, and achy to the RLQ radiating across lower abdomen and around to lower back. Worsens with movement and with palpation, no alleviating factors, has tried prune juice and her prescribed Tramadol without relief. Reports associated chills and nausea with 1 episode of non bloody emesis which developed over the past 48 hours. States no change in baseline intermittent constipation/diarrhea. Denies fever, hematochezia, dysuria, hematuria, or vaginal discharge/bleeding. No excess NSAIDs or alcohol. LMP 11/12/16.   HPI  Past Medical History:  Diagnosis Date  . Anemia    DURING MENSES--HAS HEAVY BLEEDING WITH PERODS  . Anxiety   . Back pain, chronic    "ongoing"  . Blood transfusion    IN 2012  AFTER C -SECTION  . Cerebral thrombosis with cerebral infarction [facility]) JUNE 2011   RIGHT SIDED WEAKNESS ( ARM AND LEG ) AND SPASMS-remains with slight weakness and vertigo.  . Depression   . Diabetes mellitus   . Diabetic neuropathy ([place])    BOTH FEET --COMES AND GOES  . Endometriosis   . H/O eye surgery   . Headache(784.0)    MIGRAINES--NOT REALLY HEADACHE-MORE LIKE PRESSURE SENSATION IN HEAD-FEELS DIZZIY AND  FAINT AS THE PRESSURE RESOLVES  . Hernia, incisional, RLQ, s/p lap repair Sep 2013 09/02/2011  . Hx of migraines 10/19/2011  . Hypertension   . Leg pain, right    "like bad Charley horse"  . Panic disorder without agoraphobia   . Rash    HANDS, ARMS --STATES HX  OF RASH EVER SINCE CHILDBIRTH/PREGNANCY.  STATES THE RASH OFTEN OCCURS WHEN SHE IS REALLY STRESSED."goes and comes-presently left ring finger"  . Restless leg syndrome    DIAGNOSED BY SLEEP STUDY - PT TOLD SHE DID NOT HAVE SLEEP APNEA  . Right rotator cuff tear    PAIN IN RIGHT SHOULDER  . SBO (small bowel obstruction) ([place]) 06/03/2012  . Spastic hemiplegia affecting dominant side ([place])   . Stroke ([place])   . Ventral hernia    RIGHT LOWER QUADRANT-CAUSING SOME PAIN    Patient Active Problem List   Diagnosis Date Noted  . OSA (obstructive sleep apnea) 10/17/2015  . Periodic limb movement disorder (PLMD) 10/17/2015  . Essential hypertension 05/26/2015  . Type 2 diabetes mellitus with hyperglycemia, with long-term current use of insulin ([place]) 05/26/2015  . Abdominal wall mass of right lower quadrant 05/25/2015  . Hemiparesis affecting right side as late effect of stroke ([place]) 11/09/2014  . Abdominal wall seroma ([place])   . Sepsis due to undetermined organism ([place]) 03/06/2014  . Nausea vomiting and diarrhea 03/06/2014  . Sepsis ([place]) 03/06/2014  . Tension headache 09/01/2013  . Nausea with vomiting ?Gastroparesis? 03/21/2013  . Spastic hemiplegia affecting dominant side ([place]) 02/16/2013  . Recurrent ventral incisional hernia s/p closure/repair w mesh 02/04/2013 01/26/2013  . Constipation, chronic 01/26/2013  . Abdominal pain, chronic, right lower quadrant 06/03/2012  . History of CVA (cerebrovascular accident) 06/03/2012  . HTN (hypertension) 06/03/2012  . Diabetes  mellitus type II, uncontrolled ([place]) 10/16/2011  . Obesity (BMI 30-39.9) 09/02/2011  . Rotator cuff tear 08/25/2011  . Sciatica 06/10/2011  . Paresthesias in right hand 06/10/2011  . Lumbago 05/09/2011  . Paresthesias 05/09/2011    Past Surgical History:  Procedure Laterality Date  . CESAREAN SECTION  2012  . DIAGNOSTIC LAPAROSCOPY    . EYE SURGERY     Eye laser for vessel hemorrhaging  . HERNIA REPAIR  10/03/11    ventral hernia repair  . URETER REVISION     Bilateral "twisted"  . UTERINE FIBROID SURGERY     2 SURGERIES FOR FIBROIDS    OB History    Gravida Para Term Preterm AB Living   1 1 0 1 0 1   SAB TAB Ectopic Multiple Live Births   0 0 0 0         Home Medications    Prior to Admission medications   Medication Sig Start Date End Date Taking? Authorizing Provider  allopurinol (ZYLOPRIM) 100 MG tablet Take 100 mg by mouth every evening. 03/13/14  Yes [provider]  amLODipine (NORVASC) 10 MG tablet Take 1 tablet (10 mg total) by mouth daily. 11/18/15  Yes Pisciotta, Charna Elizabeth  Amlodipine-Valsartan-HCTZ 10-320-25 MG TABS Take 1 tablet by mouth daily.  02/09/16  Yes [provider]  aspirin 325 MG tablet Take 325 mg by mouth at bedtime.    Yes [provider]  colchicine 0.6 MG tablet Take 0.6 mg by mouth daily.  02/14/14  Yes [provider]  Cyanocobalamin (VITAMIN B-12 PO) Take 1 tablet by mouth daily.   Yes [provider]  diphenhydrAMINE (BENADRYL) 25 MG tablet Take 1 tablet (25 mg total) by mouth every 6 (six) hours. 07/01/16  Yes Jola Schmidt, MD  famotidine (PEPCID) 20 MG tablet Take 1 tablet (20 mg total) by mouth 2 (two) times daily. 07/01/16  Yes Jola Schmidt, MD  furosemide (LASIX) 40 MG tablet Take 20-40 mg by mouth daily as needed for fluid or edema.    Yes [provider]  gabapentin (NEURONTIN) 600 MG tablet Take 600-1,200 mg by mouth See admin instructions. Take 2 tablets every morning, 1 tablet at noon and 2 tablets every evening   Yes [provider]  Insulin Glargine (BASAGLAR KWIKPEN) 100 UNIT/ML SOPN Inject 15 Units daily into the skin.    Yes [provider]  Liraglutide (VICTOZA) 18 MG/3ML SOPN Inject 1.2 mg into the skin daily.    Yes [provider]  LYRICA 75 MG capsule Take 75 mg daily by mouth. 10/31/16  Yes [provider]  methocarbamol (ROBAXIN) 500 MG tablet Take 1  tablet (500 mg total) by mouth 3 (three) times daily. 09/01/16  Yes Kirsteins, Luanna Salk, MD  NOVOLOG FLEXPEN 100 UNIT/ML FlexPen Inject 30 Units into the skin 3 (three) times daily. Sliding scale 12/14/13  Yes [provider]  ONE TOUCH ULTRA TEST test strip  06/30/15  Yes [provider]  propranolol (INDERAL) 20 MG tablet Take 20 mg by mouth every evening.   Yes [provider]  rOPINIRole (REQUIP) 0.5 MG tablet Take 1 tablet (0.5 mg total) by mouth at bedtime. 11/26/15  Yes Dohmeier, Asencion Partridge, MD  sertraline (ZOLOFT) 100 MG tablet Take 100 mg daily by mouth. 10/29/16  Yes [provider]  sertraline (ZOLOFT) 50 MG tablet Take 50 mg by mouth daily.   Yes [provider]  tiZANidine (ZANAFLEX) 2 MG tablet TAKE 1 TABLET AT  BEDTIME DAILY. CAN TAKE 1/2 TABLET IN THE AM AS NEEDED 09/09/16  Yes Kirsteins, Luanna Salk, MD  traMADol (ULTRAM) 50 MG tablet TAKE 1 TABLET BY MOUTH TWO TIMES A DAY AS NEEDED FOR MODERATE OR SEVERE PAIN 11/07/16  Yes Kirsteins, Luanna Salk, MD  prednisoLONE acetate (PRED FORTE) 1 % ophthalmic suspension Place 1 drop into the left eye 4 (four) times daily.    [provider]    Family History Family History  Problem Relation Age of Onset  . Diabetes Father   . Kidney disease Father   . Diabetes Maternal Grandmother   . Hyperlipidemia Paternal Grandmother   . Stroke Paternal Grandmother   . Other Neg Hx     Social History Social History   Tobacco Use  . Smoking status: Never Smoker  . Smokeless tobacco: Never Used  Substance Use Topics  . Alcohol use: No    Alcohol/week: 0.0 oz  . Drug use: No     Allergies   Contrast media [iodinated diagnostic agents]; Iohexol; Midazolam hcl; Shellfish allergy; Metformin and related; Other; Avandia [rosiglitazone maleate]; Geodon [ziprasidone]; Kiwi extract; and Latex   Review of Systems Review of Systems  Constitutional: Positive for chills. Negative for fever.  HENT: Positive  for congestion. Negative for ear pain and sore throat.   Respiratory: Negative for cough.   Cardiovascular: Negative for chest pain.  Gastrointestinal: Positive for abdominal pain, constipation (intermittent chronic unchanged. ), diarrhea (intermittent chronic unchanged. ), nausea and vomiting. Negative for blood in stool.  Genitourinary: Negative for dysuria, flank pain, hematuria, vaginal bleeding and vaginal discharge.  Musculoskeletal: Negative for neck pain.  Skin: Negative for rash.  Neurological: Negative for syncope.  All other systems reviewed and are negative.    Physical Exam Updated Vital Signs BP 139/74   Pulse 79   Temp 98.6 F (37 C) (Oral)   Resp 18   Ht 5' 6.5" (1.689 m)   Wt 110.2 kg (243 lb)   LMP 11/12/2016   SpO2 94%   BMI 38.63 kg/m   Physical Exam  Constitutional: She appears well-developed and well-nourished.  Non-toxic appearance. No distress.  HENT:  Head: Normocephalic and atraumatic.  Eyes: Conjunctivae are normal. Right eye exhibits no discharge. Left eye exhibits no discharge.  Cardiovascular: Normal rate and regular rhythm.  No murmur heard. Pulmonary/Chest: Breath sounds normal. No respiratory distress. She has no wheezes. She has no rales.  Abdominal: Soft. Bowel sounds are normal. There is tenderness in the right lower quadrant and suprapubic area. There is no rigidity, no rebound, no guarding, no CVA tenderness and negative Murphy's sign.  Musculoskeletal:  Back: no vertebral tenderness. R lumbar paraspinal muscle tenderness.   Neurological: She is alert.  Clear speech.   Skin: Skin is warm and dry. No rash noted.  Psychiatric: She has a normal mood and affect. Her behavior is normal.  Nursing note and vitals reviewed.    ED Treatments / Results   Results for orders placed or performed during the hospital encounter of 11/26/16  Lipase, blood  Result Value Ref Range   Lipase 33 11 - 51 U/L  Comprehensive metabolic panel  Result  Value Ref Range   Sodium 137 135 - 145 mmol/L   Potassium 4.0 3.5 - 5.1 mmol/L   Chloride 106 101 - 111 mmol/L   CO2 24 22 - 32 mmol/L   Glucose, Bld 191 (H) 65 - 99 mg/dL   BUN 10 6 - 20 mg/dL   Creatinine, Ser 0.80  0.44 - 1.00 mg/dL   Calcium 8.9 8.9 - 10.3 mg/dL   Total Protein 7.1 6.5 - 8.1 g/dL   Albumin 3.2 (L) 3.5 - 5.0 g/dL   AST 15 15 - 41 U/L   ALT 12 (L) 14 - 54 U/L   Alkaline Phosphatase 48 38 - 126 U/L   Total Bilirubin 0.7 0.3 - 1.2 mg/dL   GFR calc non Af Amer >60 >60 mL/min   GFR calc Af Amer >60 >60 mL/min   Anion gap 7 5 - 15  CBC  Result Value Ref Range   WBC 6.5 4.0 - 10.5 K/uL   RBC 4.25 3.87 - 5.11 MIL/uL   Hemoglobin 11.3 (L) 12.0 - 15.0 g/dL   HCT 34.8 (L) 36.0 - 46.0 %   MCV 81.9 78.0 - 100.0 fL   MCH 26.6 26.0 - 34.0 pg   MCHC 32.5 30.0 - 36.0 g/dL   RDW 15.3 11.5 - 15.5 %   Platelets 324 150 - 400 K/uL  Urinalysis, Routine w reflex microscopic  Result Value Ref Range   Color, Urine YELLOW YELLOW   APPearance CLEAR CLEAR   Specific Gravity, Urine 1.014 1.005 - 1.030   pH 5.0 5.0 - 8.0   Glucose, UA 50 (A) NEGATIVE mg/dL   Hgb urine dipstick SMALL (A) NEGATIVE   Bilirubin Urine NEGATIVE NEGATIVE   Ketones, ur NEGATIVE NEGATIVE mg/dL   Protein, ur 30 (A) NEGATIVE mg/dL   Nitrite NEGATIVE NEGATIVE   Leukocytes, UA NEGATIVE NEGATIVE   RBC / HPF 0-5 0 - 5 RBC/hpf   WBC, UA 0-5 0 - 5 WBC/hpf   Bacteria, UA RARE (A) NONE SEEN   Squamous Epithelial / LPF 6-30 (A) NONE SEEN  I-Stat Beta hCG blood, ED ([facility], [facility], [facility] only)  Result Value Ref Range   I-stat hCG, quantitative <5.0 <5 mIU/mL   Comment 3           Ct Abdomen Pelvis Wo Contrast  Result Date: 11/26/2016 CLINICAL DATA:  Lower pelvic pain EXAM: CT ABDOMEN AND PELVIS WITHOUT CONTRAST TECHNIQUE: Multidetector CT imaging of the abdomen and pelvis was performed following the standard protocol without IV contrast. COMPARISON:  02/09/2015, 08/18/2014 FINDINGS: Lower chest: Lung bases demonstrate no  consolidation or effusion. Mild cardiomegaly. Hepatobiliary: Hepatic steatosis. No calcified gallstones or biliary dilatation Pancreas: Unremarkable. No pancreatic ductal dilatation or surrounding inflammatory changes. Spleen: Normal in size without focal abnormality. Adrenals/Urinary Tract: Adrenal glands are unremarkable. Kidneys are normal, without renal calculi, focal lesion, or hydronephrosis. Bladder is unremarkable. Bladder appears adhesed to the anterior abdominal wall. Stomach/Bowel: Stomach is within normal limits. Appendix appears normal. No evidence of bowel wall thickening, distention, or inflammatory changes. Sigmoid colon diverticular disease without acute inflammation Vascular/Lymphatic: Aortic atherosclerosis. No enlarged abdominal or pelvic lymph nodes. Reproductive: Enlarged uterus with calcified masses consistent with fibroids Other: Prior ventral hernia repair with scarring lower anterior pelvis. No free air or free fluid previously noted right lower quadrant abdominal wall fluid collection has largely resolved. Minimal 4.2 by 1.8 cm residual fluid collection in the right anterior abdominal wall musculature. Nonspecific skin thickening and edema within the subcutaneous of the anterior abdominal wall, similar to slight increase compared to prior Musculoskeletal: No acute or significant osseous findings. IMPRESSION: 1. Negative for kidney stone, hydronephrosis or ureteral stone. No acute intra-abdominal or pelvic abnormality. 2. Decreased size of previously noted right lower quadrant abdominal wall fluid collection with small residual noted. Persistent edema superficial to the abdominal wall musculature with  possible slight increased edema in the subcutaneous fat of the right lower abdominal wall, query cellulitis 3. Hepatic steatosis 4. Enlarged fibroid uterus Electronically Signed   By: Donavan Foil M.D.   On: 11/26/2016 18:03  Medications Ordered in ED Medications  Barium Sulfate 2.1 % SUSP  (not administered)  ondansetron (ZOFRAN) injection 4 mg (4 mg Intravenous Given 11/26/16 1559)  fentaNYL (SUBLIMAZE) injection 50 mcg (50 mcg Intravenous Given 11/26/16 1559)  HYDROmorphone (DILAUDID) injection 0.5 mg (0.5 mg Intravenous Given 11/26/16 1730)     Initial Impression / Assessment and Plan / ED Course  I have reviewed the triage vital signs and the nursing notes.  Pertinent labs & imaging results that were available during my care of the patient were reviewed by me and considered in my medical decision making (see chart for details).  Patient presents with RLQ abdominal pain, she is nontoxic appearing with stable vital signs. No focal tenderness, but given pain location and extensive surgical history will obtain CT scan to evaluate with main concerns being appendicitis and acute complication from previous surgical procedure.    17:15 Re-evaluation: patient's nausea has improved, able to tolerate PO contrast. Pain has not changed after Fentanyl. 0.5 mg Dilaudid ordered.   18:05: Re-evaluation: patient's pain substantially improved, resting comfortably. Denies any nausea or vomiting.   After review of labs and CT abdomen/pelvis in combination with H&P there is no indication of appendicitis, bowel obstruction, bowel perforation, cholecystitis, diverticulitis, PID, ectopic pregnancy, pancreatitis, or acute complication from prior surgical procedures. Discussed patient's CT results and labs with her and her husband who confirmed understanding. Made point of protein on UA- patient has had this previously, but asked she discuss this with her primary care provider. Discussed findings on CT that caused concern for potential cellulitis, patient has no rash, erythema, or warmth to the area and she is afebrile. Discussed options of initiating treatment with abx vs. Waiting for re-evaluation with her PCP as well as the risks/benefits of each option. Patient opted to hold off on abx and see her PCP  for follow up. Patient was instructed to follow up with PCP in 2 days, all questions were answered. Patient's pain and nausea adequately managed in the emergency department. Discharged home with prescription for zofran, PCP follow up, and emergency department return precautions.   Final Clinical Impressions(s) / ED Diagnoses   Final diagnoses:  Right lower quadrant abdominal pain    ED Discharge Orders        Ordered    ondansetron (ZOFRAN) 4 MG tablet  Every 8 hours PRN     11/26/16 1831       Amaryllis Dyke, PA-C 11/26/16 1937    Merrily Pew, MD 11/29/16 [phone]

## 2016-11-26 NOTE — ED Triage Notes (Signed)
Pt here via [facility] from [place] for r/o appy.

## 2016-11-26 NOTE — Discharge Instructions (Signed)
You were seen in th emergency department today for abdominal pain. Your labs are attached to your discharge paper and your CT scan results are below. Continue to take your Tramadol at home as prescribed and supplement with Ibuprofen and Tylenol for pain. Be sure to eat something when taking the Ibuprofen as this can cause stomach upset if taken by itself or in excess. I have provided you a prescription for Zofran, this is to be taken as needed every 8 hours for nausea.   Be sure to follow-up with your primary care provider in 2 days for re-evaluation and further management. Bring your discharge paperwork with you so that your doctor can see your results from your emergency department visit. As discussed return to the emergency department for any new or worsening symptoms such as increased abdominal pain, fever, rash, or inability to keep down food or water.   There was also protein found in your urine. This has been present with previous urine samples you have provided, but please make your primary care provider aware of this.   CT Scan Abdomen/Pelvis with PO Contrast:  IMPRESSION:  1. Negative for kidney stone, hydronephrosis or ureteral stone. No  acute intra-abdominal or pelvic abnormality.  2. Decreased size of previously noted right lower quadrant abdominal  wall fluid collection with small residual noted. Persistent edema  superficial to the abdominal wall musculature with possible slight  increased edema in the subcutaneous fat of the right lower abdominal  wall, query cellulitis  3. Hepatic steatosis  4. Enlarged fibroid uterus

## 2016-11-26 NOTE — ED Provider Notes (Signed)
Medical screening examination/treatment/procedure(s) were conducted as a shared visit with non-physician practitioner(s) and myself.  I personally evaluated the patient during the encounter.  46 year old female with a long complicated surgical history presents emerged from today with 2 weeks of progressively worsening right lower quadrant abdominal pain associated with some chills but no fevers.  She had nausea and one episode of nonbloody nonbilious vomiting this morning.  She has intermittent diarrhea and constipation at baseline and this has not changed or become any more consistent.  She has no rashes.  She states she has a history of kidney stones this feels nothing like it.  She also states he has a history of urinary tract infections which is also does not feel like.  She has no vaginal symptoms. Exam patient with right back, flank and right lower quadrant abdominal tenderness.  No rash.  No rebound or guarding.  No other evidence of peritonitis.  Thus her abdomen is benign.  Her vital signs are within normal limits. Although her labs are entirely within normal limits patient is at high risk for adhesions, mesh complications or other comp occasions from surgery so we will get a CT scan.  If this is negative I would suggest likely follow-up with general surgery for further management of her symptoms.   EKG Interpretation None         Tina [name], Corene Cornea, MD 11/29/16 [phone]

## 2016-11-26 NOTE — ED Notes (Signed)
Patient transported to CT 

## 2016-12-02 DIAGNOSIS — Z0000 Encounter for general adult medical examination without abnormal findings: Secondary | ICD-10-CM | POA: Diagnosis not present

## 2016-12-02 DIAGNOSIS — R1030 Lower abdominal pain, unspecified: Secondary | ICD-10-CM | POA: Diagnosis not present

## 2016-12-02 DIAGNOSIS — E1142 Type 2 diabetes mellitus with diabetic polyneuropathy: Secondary | ICD-10-CM | POA: Diagnosis not present

## 2016-12-02 DIAGNOSIS — E118 Type 2 diabetes mellitus with unspecified complications: Secondary | ICD-10-CM | POA: Diagnosis not present

## 2016-12-02 DIAGNOSIS — I10 Essential (primary) hypertension: Secondary | ICD-10-CM | POA: Diagnosis not present

## 2016-12-09 DIAGNOSIS — K5904 Chronic idiopathic constipation: Secondary | ICD-10-CM | POA: Diagnosis not present

## 2016-12-09 DIAGNOSIS — R140 Abdominal distension (gaseous): Secondary | ICD-10-CM | POA: Diagnosis not present

## 2016-12-09 DIAGNOSIS — K760 Fatty (change of) liver, not elsewhere classified: Secondary | ICD-10-CM | POA: Diagnosis not present

## 2016-12-09 DIAGNOSIS — R1031 Right lower quadrant pain: Secondary | ICD-10-CM | POA: Diagnosis not present

## 2016-12-11 DIAGNOSIS — H2511 Age-related nuclear cataract, right eye: Secondary | ICD-10-CM | POA: Diagnosis not present

## 2016-12-12 ENCOUNTER — Other Ambulatory Visit: Payer: Self-pay

## 2016-12-12 ENCOUNTER — Encounter: Payer: Federal, State, Local not specified - PPO | Attending: Physical Medicine & Rehabilitation

## 2016-12-12 ENCOUNTER — Encounter: Payer: Self-pay | Admitting: Physical Medicine & Rehabilitation

## 2016-12-12 ENCOUNTER — Ambulatory Visit (HOSPITAL_BASED_OUTPATIENT_CLINIC_OR_DEPARTMENT_OTHER): Payer: Federal, State, Local not specified - PPO | Admitting: Physical Medicine & Rehabilitation

## 2016-12-12 VITALS — BP 109/74 | HR 82

## 2016-12-12 DIAGNOSIS — I69351 Hemiplegia and hemiparesis following cerebral infarction affecting right dominant side: Secondary | ICD-10-CM

## 2016-12-12 DIAGNOSIS — I10 Essential (primary) hypertension: Secondary | ICD-10-CM | POA: Diagnosis not present

## 2016-12-12 DIAGNOSIS — M545 Low back pain: Secondary | ICD-10-CM | POA: Insufficient documentation

## 2016-12-12 DIAGNOSIS — E119 Type 2 diabetes mellitus without complications: Secondary | ICD-10-CM | POA: Insufficient documentation

## 2016-12-12 DIAGNOSIS — I69398 Other sequelae of cerebral infarction: Secondary | ICD-10-CM | POA: Diagnosis not present

## 2016-12-12 DIAGNOSIS — Z841 Family history of disorders of kidney and ureter: Secondary | ICD-10-CM | POA: Diagnosis not present

## 2016-12-12 DIAGNOSIS — E1140 Type 2 diabetes mellitus with diabetic neuropathy, unspecified: Secondary | ICD-10-CM | POA: Diagnosis not present

## 2016-12-12 DIAGNOSIS — D649 Anemia, unspecified: Secondary | ICD-10-CM | POA: Diagnosis not present

## 2016-12-12 DIAGNOSIS — Z823 Family history of stroke: Secondary | ICD-10-CM | POA: Diagnosis not present

## 2016-12-12 DIAGNOSIS — Z833 Family history of diabetes mellitus: Secondary | ICD-10-CM | POA: Insufficient documentation

## 2016-12-12 DIAGNOSIS — S93491A Sprain of other ligament of right ankle, initial encounter: Secondary | ICD-10-CM | POA: Insufficient documentation

## 2016-12-12 DIAGNOSIS — G8111 Spastic hemiplegia affecting right dominant side: Secondary | ICD-10-CM | POA: Diagnosis not present

## 2016-12-12 DIAGNOSIS — I6529 Occlusion and stenosis of unspecified carotid artery: Secondary | ICD-10-CM

## 2016-12-12 DIAGNOSIS — G43909 Migraine, unspecified, not intractable, without status migrainosus: Secondary | ICD-10-CM | POA: Diagnosis not present

## 2016-12-12 DIAGNOSIS — R269 Unspecified abnormalities of gait and mobility: Secondary | ICD-10-CM

## 2016-12-12 DIAGNOSIS — Z9889 Other specified postprocedural states: Secondary | ICD-10-CM | POA: Diagnosis not present

## 2016-12-12 DIAGNOSIS — M533 Sacrococcygeal disorders, not elsewhere classified: Secondary | ICD-10-CM | POA: Diagnosis not present

## 2016-12-12 DIAGNOSIS — Z79891 Long term (current) use of opiate analgesic: Secondary | ICD-10-CM | POA: Diagnosis not present

## 2016-12-12 DIAGNOSIS — G8929 Other chronic pain: Secondary | ICD-10-CM | POA: Insufficient documentation

## 2016-12-12 MED ORDER — TRAMADOL HCL 50 MG PO TABS: 50.0000 mg | ORAL_TABLET | Freq: Two times a day (BID) | ORAL | 1 refills | Status: DC

## 2016-12-12 NOTE — Patient Instructions (Signed)
Ask neuro to refill requip

## 2016-12-12 NOTE — Progress Notes (Signed)
Subjective:    Patient ID: Tina [name], female    DOB: Mar 24, 1970, 46 y.o.   MRN: [id number]  HPI   Low back better since SI inj Spasms down Right leg , Right thigh and plantar surface of foot with sitting or laying down  This has been a chronic complaint she was complaining of this 5 years ago and an MRI in 2013 showed no compressive lesions.  Still takes Gabapentin 600mg  one po BID and 2 tabs at noc Tizanidine 2mg  one po qhs Tramadol BID Off sertraline Off Requip  Pain Inventory Average Pain 6 Pain Right Now 6 My pain is sharp, burning and stabbing  In the last 24 hours, has pain interfered with the following? General activity 5 Relation with others 5 Enjoyment of life 5 What TIME of day is your pain at its worst? night Sleep (in general) Fair  Pain is worse with: walking and sitting Pain improves with: rest and heat/ice Relief from Meds: 0  Mobility walk without assistance ability to climb steps?  yes do you drive?  no Do you have any goals in this area?  no  Function disabled: date disabled . I need assistance with the following:  shopping Do you have any goals in this area?  no  Neuro/Psych numbness tingling spasms dizziness depression anxiety  Prior Studies Any changes since last visit?  no  Physicians involved in your care Any changes since last visit?  no   Family History  Problem Relation Age of Onset  . Diabetes Father   . Kidney disease Father   . Diabetes Maternal Grandmother   . Hyperlipidemia Paternal Grandmother   . Stroke Paternal Grandmother   . Other Neg Hx    Social History   Socioeconomic History  . Marital status: Married    Spouse name: Not on file  . Number of children: 2  . Years of education: BA degree  . Highest education level: Not on file  Social Needs  . Financial resource strain: Not on file  . Food insecurity - worry: Not on file  . Food insecurity - inability: Not on file  . Transportation needs -  medical: Not on file  . Transportation needs - non-medical: Not on file  Occupational History    Employer: UNEMPLOYED  Tobacco Use  . Smoking status: Never Smoker  . Smokeless tobacco: Never Used  Substance and Sexual Activity  . Alcohol use: No    Alcohol/week: 0.0 oz  . Drug use: No  . Sexual activity: Yes    Birth control/protection: None  Other Topics Concern  . Not on file  Social History Narrative   Patient is married with 2 children.   Patient is right handed.   Patient has her  BA degree.   Patient drinks 1 cup daily.   Past Surgical History:  Procedure Laterality Date  . APPLICATION OF WOUND VAC N/A 05/25/2015   Procedure: APPLICATION OF WOUND VAC;  Surgeon: Michael Boston, MD;  Location: [facility];  Service: General;  Laterality: N/A;  . CESAREAN SECTION  2012  . DIAGNOSTIC LAPAROSCOPY    . ESOPHAGOGASTRODUODENOSCOPY N/A 06/03/2012   Procedure: ESOPHAGOGASTRODUODENOSCOPY (EGD);  Surgeon: [name] Craver, MD;  Location: [facility] ENDOSCOPY;  Service: Endoscopy;  Laterality: N/A;  . EXCISION MASS ABDOMINAL N/A 05/25/2015   Procedure: ABDOMINAL WALL EXPLORATION EXCISION OF SEROMA REMOVAL OF REDUNDANT SKIN ;  Surgeon: Michael Boston, MD;  Location: [facility];  Service: General;  Laterality: N/A;  . EYE SURGERY  Eye laser for vessel hemorrhaging  . HERNIA REPAIR  10/03/11   ventral hernia repair  . INSERTION OF MESH N/A 02/04/2013   Procedure: INSERTION OF MESH;  Surgeon: Adin Hector, MD;  Location: [facility];  Service: General;  Laterality: N/A;  . UMBILICAL HERNIA REPAIR N/A 02/04/2013   Procedure: LAPAROSCOPIC ventral wall hernia repair LAPAROSCOPIC LYSIS OF ADHESIONS laparoscopic exploration of abdomen ;  Surgeon: Adin Hector, MD;  Location: [facility];  Service: General;  Laterality: N/A;  . URETER REVISION     Bilateral "twisted"  . UTERINE FIBROID SURGERY     2 SURGERIES FOR FIBROIDS  . VENTRAL HERNIA REPAIR  10/03/2011   Procedure: LAPAROSCOPIC VENTRAL HERNIA;  Surgeon: Adin Hector, MD;  Location: [facility];  Service: General;  Laterality: N/A;   Past Medical History:  Diagnosis Date  . Anemia    DURING MENSES--HAS HEAVY BLEEDING WITH PERODS  . Anxiety   . Back pain, chronic    "ongoing"  . Blood transfusion    IN 2012  AFTER C -SECTION  . Cerebral thrombosis with cerebral infarction [facility]) JUNE 2011   RIGHT SIDED WEAKNESS ( ARM AND LEG ) AND SPASMS-remains with slight weakness and vertigo.  . Depression   . Diabetes mellitus   . Diabetic neuropathy ([place])    BOTH FEET --COMES AND GOES  . Endometriosis   . H/O eye surgery   . Headache(784.0)    MIGRAINES--NOT REALLY HEADACHE-MORE LIKE PRESSURE SENSATION IN HEAD-FEELS DIZZIY AND  FAINT AS THE PRESSURE RESOLVES  . Hernia, incisional, RLQ, s/p lap repair Sep 2013 09/02/2011  . Hx of migraines 10/19/2011  . Hypertension   . Leg pain, right    "like bad Charley horse"  . Panic disorder without agoraphobia   . Rash    HANDS, ARMS --STATES HX OF RASH EVER SINCE CHILDBIRTH/PREGNANCY.  STATES THE RASH OFTEN OCCURS WHEN SHE IS REALLY STRESSED."goes and comes-presently left ring finger"  . Restless leg syndrome    DIAGNOSED BY SLEEP STUDY - PT TOLD SHE DID NOT HAVE SLEEP APNEA  . Right rotator cuff tear    PAIN IN RIGHT SHOULDER  . SBO (small bowel obstruction) ([place]) 06/03/2012  . Spastic hemiplegia affecting dominant side ([place])   . Stroke ([place])   . Ventral hernia    RIGHT LOWER QUADRANT-CAUSING SOME PAIN   LMP 11/12/2016   Opioid Risk Score:   Fall Risk Score:  `1  Depression screen PHQ 2/9  Depression screen [facility] 2/9 12/12/2016 03/08/2015  Decreased Interest 0 2  Down, Depressed, Hopeless 0 2  PHQ - 2 Score 0 4  Altered sleeping - 3  Tired, decreased energy - 3  Change in appetite - 1  Feeling bad or failure about yourself  - 2  Trouble concentrating - 3  Moving slowly or fidgety/restless - 2  Suicidal thoughts - 1  PHQ-9 Score - 19  Difficult doing work/chores - Somewhat difficult     Review of  Systems  Constitutional: Negative.   HENT: Negative.   Eyes: Negative.   Respiratory: Negative.   Cardiovascular: Negative.   Gastrointestinal: Negative.   Endocrine: Negative.   Genitourinary: Negative.   Musculoskeletal: Negative.   Skin: Negative.   Allergic/Immunologic: Negative.   Neurological: Negative.   Hematological: Negative.   Psychiatric/Behavioral: Negative.        Objective:   Physical Exam  Constitutional: She is oriented to person, place, and time. She appears well-developed and well-nourished.  HENT:  Head:  Normocephalic and atraumatic.  Eyes: Pupils are equal, round, and reactive to light.  Musculoskeletal: Normal range of motion.  Neurological: She is alert and oriented to person, place, and time.  Psychiatric: She has a normal mood and affect.  Nursing note and vitals reviewed.   Motor strength is 4/5 in the right deltoid bicep tricep grip as well as right hip flexor knee extensor ankle dorsiflexor hip adductor hip abductor 5/5 in the left deltoid bicep tricep grip hip flexor knee extensor and close flexor Ambulates without assistive device she does have cocontraction at her knee extensors and knee flexors.  Some mild hip hiking on the right side. Mild tenderness right PSIS area.      Assessment & Plan:  #1.  History of left pontine infarct with right hemiparesis Chronic gait disorder  2.  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  Right lower extremity pain post stroke pain syndrome continue gabapentin.  We discussed Lyrica however she has tried this before and did not tolerate very well Continue tramadol 50 mg twice daily We will recheck in 6 weeks.  May need to repeat sacroiliac injections. Discussed with patient agrees with plan

## 2016-12-19 DIAGNOSIS — E118 Type 2 diabetes mellitus with unspecified complications: Secondary | ICD-10-CM | POA: Diagnosis not present

## 2016-12-19 DIAGNOSIS — E119 Type 2 diabetes mellitus without complications: Secondary | ICD-10-CM | POA: Diagnosis not present

## 2016-12-19 DIAGNOSIS — M109 Gout, unspecified: Secondary | ICD-10-CM | POA: Diagnosis not present

## 2016-12-19 DIAGNOSIS — I10 Essential (primary) hypertension: Secondary | ICD-10-CM | POA: Diagnosis not present

## 2017-01-08 ENCOUNTER — Other Ambulatory Visit: Payer: Self-pay | Admitting: Gastroenterology

## 2017-01-08 DIAGNOSIS — R112 Nausea with vomiting, unspecified: Secondary | ICD-10-CM

## 2017-01-08 DIAGNOSIS — R1011 Right upper quadrant pain: Secondary | ICD-10-CM | POA: Diagnosis not present

## 2017-01-08 DIAGNOSIS — K760 Fatty (change of) liver, not elsewhere classified: Secondary | ICD-10-CM | POA: Diagnosis not present

## 2017-01-08 DIAGNOSIS — R110 Nausea: Secondary | ICD-10-CM | POA: Diagnosis not present

## 2017-01-08 DIAGNOSIS — K582 Mixed irritable bowel syndrome: Secondary | ICD-10-CM | POA: Diagnosis not present

## 2017-01-12 ENCOUNTER — Telehealth: Payer: Self-pay | Admitting: Physical Medicine & Rehabilitation

## 2017-01-12 MED ORDER — TRAMADOL HCL 50 MG PO TABS: 50.0000 mg | ORAL_TABLET | Freq: Two times a day (BID) | ORAL | 0 refills | Status: DC

## 2017-01-12 NOTE — Telephone Encounter (Signed)
Pt is requesting a refill of Tramadol. Please advise.

## 2017-01-12 NOTE — Addendum Note (Signed)
Addended by: Marland Mcalpine B on: 01/12/2017 04:17 PM   Modules accepted: Orders

## 2017-01-14 DIAGNOSIS — F331 Major depressive disorder, recurrent, moderate: Secondary | ICD-10-CM | POA: Diagnosis not present

## 2017-01-14 DIAGNOSIS — F411 Generalized anxiety disorder: Secondary | ICD-10-CM | POA: Diagnosis not present

## 2017-01-15 DIAGNOSIS — H4311 Vitreous hemorrhage, right eye: Secondary | ICD-10-CM | POA: Diagnosis not present

## 2017-01-15 DIAGNOSIS — H359 Unspecified retinal disorder: Secondary | ICD-10-CM | POA: Diagnosis not present

## 2017-01-15 DIAGNOSIS — E113591 Type 2 diabetes mellitus with proliferative diabetic retinopathy without macular edema, right eye: Secondary | ICD-10-CM | POA: Diagnosis not present

## 2017-01-15 DIAGNOSIS — E113592 Type 2 diabetes mellitus with proliferative diabetic retinopathy without macular edema, left eye: Secondary | ICD-10-CM | POA: Diagnosis not present

## 2017-01-19 ENCOUNTER — Ambulatory Visit (HOSPITAL_COMMUNITY)
Admission: RE | Admit: 2017-01-19 | Discharge: 2017-01-19 | Disposition: A | Discharge status: Home / Self Care | Payer: Federal, State, Local not specified - PPO | Source: Ambulatory Visit | Attending: Gastroenterology | Admitting: Gastroenterology

## 2017-01-19 ENCOUNTER — Encounter (HOSPITAL_COMMUNITY)
Admission: RE | Admit: 2017-01-19 | Discharge: 2017-01-19 | Disposition: A | Discharge status: Home / Self Care | Payer: Federal, State, Local not specified - PPO | Source: Ambulatory Visit | Attending: Gastroenterology | Admitting: Gastroenterology

## 2017-01-19 DIAGNOSIS — R110 Nausea: Secondary | ICD-10-CM | POA: Insufficient documentation

## 2017-01-19 DIAGNOSIS — K7689 Other specified diseases of liver: Secondary | ICD-10-CM | POA: Diagnosis not present

## 2017-01-19 DIAGNOSIS — R932 Abnormal findings on diagnostic imaging of liver and biliary tract: Secondary | ICD-10-CM | POA: Diagnosis not present

## 2017-01-19 DIAGNOSIS — R112 Nausea with vomiting, unspecified: Secondary | ICD-10-CM

## 2017-01-19 DIAGNOSIS — R1011 Right upper quadrant pain: Secondary | ICD-10-CM

## 2017-01-19 MED ORDER — TECHNETIUM TC 99M MEBROFENIN IV KIT: 5.1000 | PACK | Freq: Once | INTRAVENOUS | Status: DC | PRN

## 2017-01-21 ENCOUNTER — Ambulatory Visit (HOSPITAL_COMMUNITY)
Admission: EM | Admit: 2017-01-21 | Discharge: 2017-01-21 | Disposition: A | Discharge status: Home / Self Care | Payer: Federal, State, Local not specified - PPO | Attending: Family Medicine | Admitting: Family Medicine

## 2017-01-21 ENCOUNTER — Encounter (HOSPITAL_COMMUNITY): Payer: Self-pay | Admitting: Emergency Medicine

## 2017-01-21 DIAGNOSIS — R0789 Other chest pain: Secondary | ICD-10-CM | POA: Diagnosis not present

## 2017-01-21 DIAGNOSIS — S46911A Strain of unspecified muscle, fascia and tendon at shoulder and upper arm level, right arm, initial encounter: Secondary | ICD-10-CM | POA: Diagnosis not present

## 2017-01-21 DIAGNOSIS — M5489 Other dorsalgia: Secondary | ICD-10-CM | POA: Diagnosis not present

## 2017-01-21 DIAGNOSIS — R42 Dizziness and giddiness: Secondary | ICD-10-CM | POA: Diagnosis not present

## 2017-01-21 MED ORDER — KETOROLAC TROMETHAMINE 60 MG/2ML IM SOLN
60.0000 mg | Freq: Once | INTRAMUSCULAR | Status: AC
  Administered 2017-01-21: 60 mg via INTRAMUSCULAR

## 2017-01-21 MED ORDER — NAPROXEN 500 MG PO TABS: 500.0000 mg | ORAL_TABLET | Freq: Two times a day (BID) | ORAL | 0 refills | Status: AC

## 2017-01-21 MED ORDER — KETOROLAC TROMETHAMINE 60 MG/2ML IM SOLN
INTRAMUSCULAR | Status: AC
  Filled 2017-01-21: qty 2

## 2017-01-21 NOTE — ED Provider Notes (Signed)
[facility]    CSN: [id number] Arrival date & time: 01/21/17  1019     History   Chief Complaint Chief Complaint  Patient presents with  . Back Pain  . Chest Pain    HPI Tina [name] is a 47 y.o. female history of HTN, DM, CVA in 2012 presenting with right back pain and chest pain. Back pain has been going on for 4 days off and on, over the past 2 the pain has been occurring more constant. Back pain described as a dull aching/soreness. Denies ripping/tearing sensation. Also has developed chest pain that feels like she has been punched and had breath knocked out of her. Back pain and chest pain worsen with breathing and movement. Has been taking Tramadol daily, as this is prescribed for her right sided pain since her stroke. Occasional lightheaded and dizziness. No injury. History of Right rotator cuff tear/tendinopathy that she went to physical therapy for. No cough/congestion.   HPI  Past Medical History:  Diagnosis Date  . Anemia    DURING MENSES--HAS HEAVY BLEEDING WITH PERODS  . Anxiety   . Back pain, chronic    "ongoing"  . Blood transfusion    IN 2012  AFTER C -SECTION  . Cerebral thrombosis with cerebral infarction [facility]) JUNE 2011   RIGHT SIDED WEAKNESS ( ARM AND LEG ) AND SPASMS-remains with slight weakness and vertigo.  . Depression   . Diabetes mellitus   . Diabetic neuropathy ([place])    BOTH FEET --COMES AND GOES  . Endometriosis   . H/O eye surgery   . Headache(784.0)    MIGRAINES--NOT REALLY HEADACHE-MORE LIKE PRESSURE SENSATION IN HEAD-FEELS DIZZIY AND  FAINT AS THE PRESSURE RESOLVES  . Hernia, incisional, RLQ, s/p lap repair Sep 2013 09/02/2011  . Hx of migraines 10/19/2011  . Hypertension   . Leg pain, right    "like bad Charley horse"  . Panic disorder without agoraphobia   . Rash    HANDS, ARMS --STATES HX OF RASH EVER SINCE CHILDBIRTH/PREGNANCY.  STATES THE RASH OFTEN OCCURS WHEN SHE IS REALLY STRESSED."goes and comes-presently left ring  finger"  . Restless leg syndrome    DIAGNOSED BY SLEEP STUDY - PT TOLD SHE DID NOT HAVE SLEEP APNEA  . Right rotator cuff tear    PAIN IN RIGHT SHOULDER  . SBO (small bowel obstruction) ([place]) 06/03/2012  . Spastic hemiplegia affecting dominant side ([place])   . Stroke ([place])   . Ventral hernia    RIGHT LOWER QUADRANT-CAUSING SOME PAIN    Patient Active Problem List   Diagnosis Date Noted  . OSA (obstructive sleep apnea) 10/17/2015  . Periodic limb movement disorder (PLMD) 10/17/2015  . Essential hypertension 05/26/2015  . Type 2 diabetes mellitus with hyperglycemia, with long-term current use of insulin ([place]) 05/26/2015  . Abdominal wall mass of right lower quadrant 05/25/2015  . Hemiparesis affecting right side as late effect of stroke ([place]) 11/09/2014  . Abdominal wall seroma ([place])   . Sepsis due to undetermined organism ([place]) 03/06/2014  . Nausea vomiting and diarrhea 03/06/2014  . Sepsis ([place]) 03/06/2014  . Tension headache 09/01/2013  . Nausea with vomiting ?Gastroparesis? 03/21/2013  . Spastic hemiplegia affecting dominant side ([place]) 02/16/2013  . Recurrent ventral incisional hernia s/p closure/repair w mesh 02/04/2013 01/26/2013  . Constipation, chronic 01/26/2013  . Abdominal pain, chronic, right lower quadrant 06/03/2012  . History of CVA (cerebrovascular accident) 06/03/2012  . HTN (hypertension) 06/03/2012  . Diabetes mellitus type II, uncontrolled (  [place]) 10/16/2011  . Obesity (BMI 30-39.9) 09/02/2011  . Rotator cuff tear 08/25/2011  . Sciatica 06/10/2011  . Paresthesias in right hand 06/10/2011  . Lumbago 05/09/2011  . Paresthesias 05/09/2011    Past Surgical History:  Procedure Laterality Date  . APPLICATION OF WOUND VAC N/A 05/25/2015   Procedure: APPLICATION OF WOUND VAC;  Surgeon: Michael Boston, MD;  [facility];  Service: General;  Laterality: N/A;  . CESAREAN SECTION  2012  . DIAGNOSTIC LAPAROSCOPY    . ESOPHAGOGASTRODUODENOSCOPY N/A 06/03/2012    Procedure: ESOPHAGOGASTRODUODENOSCOPY (EGD);  Surgeon: [name] Craver, MD;  Location: [facility] ENDOSCOPY;  Service: Endoscopy;  Laterality: N/A;  . EXCISION MASS ABDOMINAL N/A 05/25/2015   Procedure: ABDOMINAL WALL EXPLORATION EXCISION OF SEROMA REMOVAL OF REDUNDANT SKIN ;  Surgeon: Michael Boston, MD;  Location: [facility];  Service: General;  Laterality: N/A;  . EYE SURGERY     Eye laser for vessel hemorrhaging  . HERNIA REPAIR  10/03/11   ventral hernia repair  . INSERTION OF MESH N/A 02/04/2013   Procedure: INSERTION OF MESH;  Surgeon: Adin Hector, MD;  [facility];  Service: General;  Laterality: N/A;  . UMBILICAL HERNIA REPAIR N/A 02/04/2013   Procedure: LAPAROSCOPIC ventral wall hernia repair LAPAROSCOPIC LYSIS OF ADHESIONS laparoscopic exploration of abdomen ;  Surgeon: Adin Hector, MD;  [facility];  Service: General;  Laterality: N/A;  . URETER REVISION     Bilateral "twisted"  . UTERINE FIBROID SURGERY     2 SURGERIES FOR FIBROIDS  . VENTRAL HERNIA REPAIR  10/03/2011   Procedure: LAPAROSCOPIC VENTRAL HERNIA;  Surgeon: Adin Hector, MD;  [facility];  Service: General;  Laterality: N/A;    OB History    Gravida Para Term Preterm AB Living   1 1 0 1 0 1   SAB TAB Ectopic Multiple Live Births   0 0 0 0         Home Medications    Prior to Admission medications   Medication Sig Start Date End Date Taking? Authorizing Provider  allopurinol (ZYLOPRIM) 100 MG tablet Take 100 mg by mouth every evening. 03/13/14  Yes [provider]  Amlodipine-Valsartan-HCTZ 24-235-36 MG TABS Take 1 tablet by mouth daily.  02/09/16  Yes [provider]  aspirin 325 MG tablet Take 325 mg by mouth at bedtime.    Yes [provider]  Cyanocobalamin (VITAMIN B-12 PO) Take 1 tablet by mouth daily.   Yes [provider]  famotidine (PEPCID) 20 MG tablet Take 1 tablet (20 mg total) by mouth 2 (two) times daily. 07/01/16  Yes Jola Schmidt, MD  gabapentin  (NEURONTIN) 600 MG tablet Take 600-1,200 mg by mouth See admin instructions. Take 2 tablets every morning, 1 tablet at noon and 2 tablets every evening   Yes [provider]  Insulin Glargine (BASAGLAR KWIKPEN) 100 UNIT/ML SOPN Inject 15 Units daily into the skin.    Yes [provider]  Liraglutide (VICTOZA) 18 MG/3ML SOPN Inject 1.2 mg into the skin daily.    Yes [provider]  LYRICA 75 MG capsule Take 75 mg daily by mouth. 10/31/16  Yes [provider]  methocarbamol (ROBAXIN) 500 MG tablet Take 1 tablet (500 mg total) by mouth 3 (three) times daily. 09/01/16  Yes Kirsteins, Luanna Salk, MD  NOVOLOG FLEXPEN 100 UNIT/ML FlexPen Inject 30 Units into the skin 3 (three) times daily. Sliding scale 12/14/13  Yes [provider]  [place]  TEST test strip  06/30/15  Yes [provider]  rOPINIRole (REQUIP) 0.5 MG tablet Take 1 tablet (0.5 mg total) by mouth at bedtime. 11/26/15  Yes Dohmeier, Asencion Partridge, MD  sertraline (ZOLOFT) 100 MG tablet Take 100 mg daily by mouth. 10/29/16  Yes [provider]  sertraline (ZOLOFT) 50 MG tablet Take 50 mg by mouth daily.   Yes [provider]  tiZANidine (ZANAFLEX) 2 MG tablet TAKE 1 TABLET AT BEDTIME DAILY. CAN TAKE 1/2 TABLET IN THE AM AS NEEDED 09/09/16  Yes Kirsteins, Luanna Salk, MD  traMADol (ULTRAM) 50 MG tablet Take 1 tablet (50 mg total) by mouth 2 (two) times daily. 01/12/17  Yes Kirsteins, Luanna Salk, MD  amLODipine (NORVASC) 10 MG tablet Take 1 tablet (10 mg total) by mouth daily. 11/18/15   Pisciotta, Elmyra Ricks, PA-C  colchicine 0.6 MG tablet Take 0.6 mg by mouth daily.  02/14/14   [provider]  diphenhydrAMINE (BENADRYL) 25 MG tablet Take 1 tablet (25 mg total) by mouth every 6 (six) hours. 07/01/16   Jola Schmidt, MD  furosemide (LASIX) 40 MG tablet Take 20-40 mg by mouth daily as needed for fluid or edema.     [provider]  naproxen (NAPROSYN) 500 MG tablet Take 1  tablet (500 mg total) by mouth 2 (two) times daily with a meal for 10 days. 01/21/17 01/31/17  Loeta Herst C, PA-C  ondansetron (ZOFRAN) 4 MG tablet Take 1 tablet (4 mg total) every 8 (eight) hours as needed by mouth for nausea or vomiting. 11/26/16   Petrucelli, Samantha R, PA-C  prednisoLONE acetate (PRED FORTE) 1 % ophthalmic suspension Place 1 drop into the left eye 4 (four) times daily.    [provider]  propranolol (INDERAL) 20 MG tablet Take 20 mg by mouth every evening.    [provider]    Family History Family History  Problem Relation Age of Onset  . Diabetes Father   . Kidney disease Father   . Diabetes Maternal Grandmother   . Hyperlipidemia Paternal Grandmother   . Stroke Paternal Grandmother   . Other Neg Hx     Social History Social History   Tobacco Use  . Smoking status: Never Smoker  . Smokeless tobacco: Never Used  Substance Use Topics  . Alcohol use: No    Alcohol/week: 0.0 oz  . Drug use: No     Allergies   Contrast media [iodinated diagnostic agents]; Iohexol; Midazolam hcl; Shellfish allergy; Metformin and related; Other; Avandia [rosiglitazone maleate]; Geodon [ziprasidone]; Kiwi extract; and Latex   Review of Systems Review of Systems  Constitutional: Negative for chills and fever.  HENT: Negative for congestion and sore throat.   Eyes: Negative for pain and visual disturbance.  Respiratory: Positive for shortness of breath. Negative for cough.   Cardiovascular: Positive for chest pain. Negative for palpitations.  Gastrointestinal: Negative for abdominal pain, nausea and vomiting.  Genitourinary: Negative for dysuria and hematuria.  Musculoskeletal: Positive for back pain. Negative for arthralgias.  Skin: Negative for color change and rash.  Neurological: Positive for dizziness and light-headedness. Negative for syncope, speech difficulty, weakness and headaches.  All other systems reviewed and are negative.    Physical  Exam Triage Vital Signs ED Triage Vitals  Enc Vitals Group     BP 01/21/17 1119 (!) 141/60     Pulse Rate 01/21/17 1119 84     Resp 01/21/17 1119 16     Temp 01/21/17 1119 98.5 F (36.9 C)  Temp Source 01/21/17 1119 Oral     SpO2 01/21/17 1119 100 %     Weight --      Height --      Head Circumference --      Peak Flow --      Pain Score 01/21/17 1116 10     Pain Loc --      Pain Edu? --      Excl. in [place]? --    No data found.  Updated Vital Signs BP (!) 141/60 (BP Location: Left Arm)   Pulse 84   Temp 98.5 F (36.9 C) (Oral)   Resp 16   LMP 01/05/2017   SpO2 100%    Physical Exam  Constitutional: She is oriented to person, place, and time. She appears well-developed and well-nourished. No distress.  HENT:  Head: Normocephalic and atraumatic.  Eyes: Conjunctivae and EOM are normal. Pupils are equal, round, and reactive to light.  Neck: Normal range of motion. Neck supple.  Cardiovascular: Normal rate and regular rhythm.  No murmur heard. Pulmonary/Chest: Effort normal and breath sounds normal. No respiratory distress. She has no wheezes. She has no rales.  No reproducible pain/tenderness to sternal area of chest  Abdominal: Soft. There is no tenderness.  Musculoskeletal: She exhibits no edema.  No obvious deformity or swelling. Tenderness to palpation of right thoracic/scapular muscles from approximately T3-T6  Tenderness to palpation of right pectoral insertion near shoulder  Right shoulder with Full active ROM, pain elicited with abduction and internal rotation/behind the head movement.   Neurological: She is alert and oriented to person, place, and time. No cranial nerve deficit.  Skin: Skin is warm and dry.  Psychiatric: She has a normal mood and affect.  Nursing note and vitals reviewed.    UC Treatments / Results  Labs (all labs ordered are listed, but only abnormal results are displayed) Labs Reviewed - No data to display  EKG  EKG  Interpretation None       Radiology Nm Hepato W/eject Fract  Result Date: 01/19/2017 CLINICAL DATA:  Right upper quadrant abdominal pain with nausea and vomiting for several months. EXAM: NUCLEAR MEDICINE HEPATOBILIARY IMAGING WITH GALLBLADDER EF TECHNIQUE: Sequential images of the abdomen were obtained out to 60 minutes following intravenous administration of radiopharmaceutical. After oral ingestion of Ensure, gallbladder ejection fraction was determined. At 60 min, normal ejection fraction is greater than 33%. RADIOPHARMACEUTICALS:  4.9 mCi Tc-71m  Choletec IV COMPARISON:  Multiple exams, including CT abdomen 11/26/2016 FINDINGS: Satisfactory uptake of radiopharmaceutical from the blood pool. Biliary activity at 11 minutes. Gallbladder activity at 15 minutes. Bowel activity at 41 minutes. The patient experienced no abdominal pain after drinking the meal. Calculated gallbladder ejection fraction is 40%. (Normal gallbladder ejection fraction with Ensure is greater than 33%.) IMPRESSION: 1. Normal hepatobiliary scan with gallbladder ejection fraction of 40%. Electronically Signed   By: Van Clines M.D.   On: 01/19/2017 14:48    EKG normal sinus rhythm- no acute changes or evidence of ischemia. Procedures Procedures (including critical care time)  Medications Ordered in UC Medications  ketorolac (TORADOL) injection 60 mg (not administered)     Initial Impression / Assessment and Plan / UC Course  I have reviewed the triage vital signs and the nursing notes.  Pertinent labs & imaging results that were available during my care of the patient were reviewed by me and considered in my medical decision making (see chart for details).     Pain does not appear  to be related to the heart or lungs, 100% O2, no tachycardia. She appears to have chronic right sided pain since her stroke, no new injury. Imaging deferred given full active ROM. Pain reproducible on exam. May be compensating from  rotator cuff tear with other muscles. Toradol injection today for pain. Continue tramadol, may supplement with Naprosyn. Discussed strict return precautions. Patient verbalized understanding and is agreeable with plan.   Final Clinical Impressions(s) / UC Diagnoses   Final diagnoses:  Muscle strain of right shoulder region, initial encounter    ED Discharge Orders        Ordered    naproxen (NAPROSYN) 500 MG tablet  2 times daily with meals     01/21/17 1227       Controlled Substance Prescriptions [place] Controlled Substance Registry consulted? Not Applicable   Janith Lima, [place] 01/21/17 1233

## 2017-01-21 NOTE — Discharge Instructions (Signed)
We have given you an injection of Toradol 60 mg today to help with pain. Please continue to use Tramadol daily for pain. May use naproxen to supplement for pain. Continue to rest, ice and heat. Please continue to move shoulder as we don't want it to get stiff.  Pain does not appear to be coming from the heart or lungs, but please continue to monitor symptoms of changes. If you develop increased shortness of breath, difficulty breathing, increased chest pain especially with exertion please return.

## 2017-01-21 NOTE — ED Triage Notes (Signed)
PT C/O: intermittent back pain for the past 3 days but has become more constant since yest  Sts pain radiates the the chest and having dyspnea   DENIES: inj/trauma, fevers, cough  TAKING MEDS: Tramadol, heat/ice   A&O x4... NAD... Ambulatory

## 2017-01-22 DIAGNOSIS — H4311 Vitreous hemorrhage, right eye: Secondary | ICD-10-CM | POA: Diagnosis not present

## 2017-01-22 DIAGNOSIS — E113531 Type 2 diabetes mellitus with proliferative diabetic retinopathy with traction retinal detachment not involving the macula, right eye: Secondary | ICD-10-CM | POA: Diagnosis not present

## 2017-01-23 ENCOUNTER — Other Ambulatory Visit: Payer: Self-pay

## 2017-01-23 ENCOUNTER — Ambulatory Visit (HOSPITAL_BASED_OUTPATIENT_CLINIC_OR_DEPARTMENT_OTHER): Payer: Federal, State, Local not specified - PPO | Admitting: Physical Medicine & Rehabilitation

## 2017-01-23 ENCOUNTER — Encounter: Payer: Federal, State, Local not specified - PPO | Attending: Physical Medicine & Rehabilitation

## 2017-01-23 ENCOUNTER — Encounter: Payer: Self-pay | Admitting: Physical Medicine & Rehabilitation

## 2017-01-23 DIAGNOSIS — M7918 Myalgia, other site: Secondary | ICD-10-CM

## 2017-01-23 DIAGNOSIS — Z841 Family history of disorders of kidney and ureter: Secondary | ICD-10-CM | POA: Diagnosis not present

## 2017-01-23 DIAGNOSIS — Z9889 Other specified postprocedural states: Secondary | ICD-10-CM | POA: Diagnosis not present

## 2017-01-23 DIAGNOSIS — G43909 Migraine, unspecified, not intractable, without status migrainosus: Secondary | ICD-10-CM | POA: Insufficient documentation

## 2017-01-23 DIAGNOSIS — Z823 Family history of stroke: Secondary | ICD-10-CM | POA: Insufficient documentation

## 2017-01-23 DIAGNOSIS — G8111 Spastic hemiplegia affecting right dominant side: Secondary | ICD-10-CM | POA: Insufficient documentation

## 2017-01-23 DIAGNOSIS — Z79891 Long term (current) use of opiate analgesic: Secondary | ICD-10-CM | POA: Diagnosis not present

## 2017-01-23 DIAGNOSIS — I10 Essential (primary) hypertension: Secondary | ICD-10-CM | POA: Diagnosis not present

## 2017-01-23 DIAGNOSIS — Z833 Family history of diabetes mellitus: Secondary | ICD-10-CM | POA: Diagnosis not present

## 2017-01-23 DIAGNOSIS — S93491A Sprain of other ligament of right ankle, initial encounter: Secondary | ICD-10-CM | POA: Insufficient documentation

## 2017-01-23 DIAGNOSIS — G8929 Other chronic pain: Secondary | ICD-10-CM | POA: Insufficient documentation

## 2017-01-23 DIAGNOSIS — E119 Type 2 diabetes mellitus without complications: Secondary | ICD-10-CM | POA: Insufficient documentation

## 2017-01-23 DIAGNOSIS — E1140 Type 2 diabetes mellitus with diabetic neuropathy, unspecified: Secondary | ICD-10-CM | POA: Diagnosis not present

## 2017-01-23 DIAGNOSIS — M545 Low back pain: Secondary | ICD-10-CM | POA: Insufficient documentation

## 2017-01-23 DIAGNOSIS — D649 Anemia, unspecified: Secondary | ICD-10-CM | POA: Diagnosis not present

## 2017-01-23 MED ORDER — TIZANIDINE HCL 2 MG PO TABS: ORAL_TABLET | ORAL | 3 refills | Status: DC

## 2017-01-23 MED ORDER — TRAMADOL HCL 50 MG PO TABS: 50.0000 mg | ORAL_TABLET | Freq: Two times a day (BID) | ORAL | 0 refills | Status: DC

## 2017-01-23 NOTE — Progress Notes (Signed)
Subjective:    Patient ID: Tina [name], female    DOB: [birth year], 47 y.o.   MRN: [id number]  HPI  Hx of Right hemiparesis due to CVA, will exacerbation of Right shoulder pain ~1 wk ago Right post shoulder pain picked up pocketbook and felt a sudden increase in pain Pain is over R scapular and next to it No increase in numbness or tingling in RUE, No neck pain Breathing and walking hurt as well as putting "pressure on RIght foot" Had surgery yesterday for Right viteous hemmorhage post cataract Pain Inventory Average Pain 9 Pain Right Now 9 My pain is sharp, stabbing and aching  In the last 24 hours, has pain interfered with the following? General activity 10 Relation with others 10 Enjoyment of life 10 What TIME of day is your pain at its worst? all Sleep (in general) Fair  Pain is worse with: walking, bending, sitting, inactivity, standing, unsure and some activites Pain improves with: . Relief from Meds: 0  Mobility walk without assistance ability to climb steps?  yes do you drive?  yes Do you have any goals in this area?  no  Function not employed: date last employed . I need assistance with the following:  . Do you have any goals in this area?  no  Neuro/Psych weakness numbness spasms dizziness confusion depression anxiety  Prior Studies Any changes since last visit?  no  Physicians involved in your care Any changes since last visit?  no   Family History  Problem Relation Age of Onset  . Diabetes Father   . Kidney disease Father   . Diabetes Maternal Grandmother   . Hyperlipidemia Paternal Grandmother   . Stroke Paternal Grandmother   . Other Neg Hx    Social History   Socioeconomic History  . Marital status: Married    Spouse name: None  . Number of children: 2  . Years of education: BA degree  . Highest education level: None  Social Needs  . Financial resource strain: None  . Food insecurity - worry: None  . Food insecurity -  inability: None  . Transportation needs - medical: None  . Transportation needs - non-medical: None  Occupational History    Employer: UNEMPLOYED  Tobacco Use  . Smoking status: Never Smoker  . Smokeless tobacco: Never Used  Substance and Sexual Activity  . Alcohol use: No    Alcohol/week: 0.0 oz  . Drug use: No  . Sexual activity: Yes    Birth control/protection: None  Other Topics Concern  . None  Social History Narrative   Patient is married with 2 children.   Patient is right handed.   Patient has her  BA degree.   Patient drinks 1 cup daily.   Past Surgical History:  Procedure Laterality Date  . APPLICATION OF WOUND VAC N/A 05/25/2015   Procedure: APPLICATION OF WOUND VAC;  Surgeon: Michael Boston, MD;  Location: [facility];  Service: General;  Laterality: N/A;  . CESAREAN SECTION  2012  . DIAGNOSTIC LAPAROSCOPY    . ESOPHAGOGASTRODUODENOSCOPY N/A 06/03/2012   Procedure: ESOPHAGOGASTRODUODENOSCOPY (EGD);  Surgeon: [name] Craver, MD;  Location: [facility] ENDOSCOPY;  Service: Endoscopy;  Laterality: N/A;  . EXCISION MASS ABDOMINAL N/A 05/25/2015   Procedure: ABDOMINAL WALL EXPLORATION EXCISION OF SEROMA REMOVAL OF REDUNDANT SKIN ;  Surgeon: Michael Boston, MD;  Location: [facility];  Service: General;  Laterality: N/A;  . EYE SURGERY     Eye laser for vessel hemorrhaging  . HERNIA  REPAIR  10/03/11   ventral hernia repair  . INSERTION OF MESH N/A 02/04/2013   Procedure: INSERTION OF MESH;  Surgeon: Adin Hector, MD;  Location: [facility];  Service: General;  Laterality: N/A;  . UMBILICAL HERNIA REPAIR N/A 02/04/2013   Procedure: LAPAROSCOPIC ventral wall hernia repair LAPAROSCOPIC LYSIS OF ADHESIONS laparoscopic exploration of abdomen ;  Surgeon: Adin Hector, MD;  Location: [facility];  Service: General;  Laterality: N/A;  . URETER REVISION     Bilateral "twisted"  . UTERINE FIBROID SURGERY     2 SURGERIES FOR FIBROIDS  . VENTRAL HERNIA REPAIR  10/03/2011   Procedure: LAPAROSCOPIC VENTRAL HERNIA;   Surgeon: Adin Hector, MD;  Location: [facility];  Service: General;  Laterality: N/A;   Past Medical History:  Diagnosis Date  . Anemia    DURING MENSES--HAS HEAVY BLEEDING WITH PERODS  . Anxiety   . Back pain, chronic    "ongoing"  . Blood transfusion    IN 2012  AFTER C -SECTION  . Cerebral thrombosis with cerebral infarction [facility]) JUNE 2011   RIGHT SIDED WEAKNESS ( ARM AND LEG ) AND SPASMS-remains with slight weakness and vertigo.  . Depression   . Diabetes mellitus   . Diabetic neuropathy ([place])    BOTH FEET --COMES AND GOES  . Endometriosis   . H/O eye surgery   . Headache(784.0)    MIGRAINES--NOT REALLY HEADACHE-MORE LIKE PRESSURE SENSATION IN HEAD-FEELS DIZZIY AND  FAINT AS THE PRESSURE RESOLVES  . Hernia, incisional, RLQ, s/p lap repair Sep 2013 09/02/2011  . Hx of migraines 10/19/2011  . Hypertension   . Leg pain, right    "like bad Charley horse"  . Panic disorder without agoraphobia   . Rash    HANDS, ARMS --STATES HX OF RASH EVER SINCE CHILDBIRTH/PREGNANCY.  STATES THE RASH OFTEN OCCURS WHEN SHE IS REALLY STRESSED."goes and comes-presently left ring finger"  . Restless leg syndrome    DIAGNOSED BY SLEEP STUDY - PT TOLD SHE DID NOT HAVE SLEEP APNEA  . Right rotator cuff tear    PAIN IN RIGHT SHOULDER  . SBO (small bowel obstruction) ([place]) 06/03/2012  . Spastic hemiplegia affecting dominant side ([place])   . Stroke ([place])   . Ventral hernia    RIGHT LOWER QUADRANT-CAUSING SOME PAIN   LMP 01/05/2017   Opioid Risk Score:   Fall Risk Score:  `1  Depression screen PHQ 2/9  Depression screen [facility] 2/9 01/23/2017 12/12/2016 03/08/2015  Decreased Interest 0 0 2  Down, Depressed, Hopeless 0 0 2  PHQ - 2 Score 0 0 4  Altered sleeping - - 3  Tired, decreased energy - - 3  Change in appetite - - 1  Feeling bad or failure about yourself  - - 2  Trouble concentrating - - 3  Moving slowly or fidgety/restless - - 2  Suicidal thoughts - - 1  PHQ-9 Score - - 19  Difficult doing  work/chores - - Somewhat difficult      Review of Systems  Constitutional: Negative.   HENT: Negative.   Eyes: Negative.   Respiratory: Negative.   Cardiovascular: Negative.   Gastrointestinal: Negative.   Endocrine: Negative.   Genitourinary: Negative.   Musculoskeletal: Negative.   Skin: Negative.   Allergic/Immunologic: Negative.   Neurological: Negative.   Hematological: Negative.   Psychiatric/Behavioral: Negative.        Objective:   Physical Exam  Constitutional: She is oriented to person, place, and time. She appears well-developed  and well-nourished.  HENT:  Head: Normocephalic and atraumatic.  Eyes: EOM are normal. Pupils are equal, round, and reactive to light.  Hemorrhage RIght lateral sclera   Neck: Normal range of motion.  Neurological: She is alert and oriented to person, place, and time.  Psychiatric: She has a normal mood and affect. Her behavior is normal. Judgment and thought content normal.  Nursing note and vitals reviewed.   Tenderness Right supraspinatus and infraspinatus Tenderness medial scapular border on RIght  Motor strength is 4+/5 (baseline) Right delt , bi , tri grip, Cervical ROM Full without pain       Assessment & Plan:  1.  Rotator cuff strain Right supra and infraspinatus  2.  Periscapular pain Right rhomboids +/- mid trap, as well as supra and infraspinatus muscle bellies  rec heat 2-3 times a day Sports cream twice a day Trigger points Cont Naproxen 7-10d  Trigger Point Injection  Indication: Right periscapular Myofascial pain not relieved by medication management and other conservative care.  Informed consent was obtained after describing risk and benefits of the procedure with the patient, this includes bleeding, bruising, infection and medication side effects.  The patient wishes to proceed and has given written consent.  The patient was placed in a seated position.  The Right supraspinatus, infraspinatus, medial  scapular border (rhomboid) area was marked and prepped with Betadine.  It was entered with a 25-gauge 1-1/2 inch needle and 1 mL of 1% lidocaine was injected into each of 6  trigger points, after negative draw back for blood.  The patient tolerated the procedure well.  Post procedure instructions were given.

## 2017-01-23 NOTE — Patient Instructions (Addendum)
Use muscle cream to shoulder twice a day , use heat 19min 3 times a day Cont Naproxen 2 times a day for at least 7d See me in 1 mo Trigger Point Injection Trigger points are areas where you have pain. A trigger point injection is a shot given in the trigger point to help relieve pain for a few days to a few months. Common places for trigger points include:  The neck.  The shoulders.  The upper back.  The lower back.  A trigger point injection will not cure long-lasting (chronic) pain permanently. These injections do not always work for every person, but for some people they can help to relieve pain for a few days to a few months. Tell a health care provider about:  Any allergies you have.  All medicines you are taking, including vitamins, herbs, eye drops, creams, and over-the-counter medicines.  Any problems you or family members have had with anesthetic medicines.  Any blood disorders you have.  Any surgeries you have had.  Any medical conditions you have. What are the risks? Generally, this is a safe procedure. However, problems may occur, including:  Infection.  Bleeding.  Allergic reaction to the injected medicine.  Irritation of the skin around the injection site.  What happens before the procedure?  Ask your health care provider about changing or stopping your regular medicines. This is especially important if you are taking diabetes medicines or blood thinners. What happens during the procedure?  Your health care provider will feel for trigger points. A marker may be used to circle the area for the injection.  The skin over the trigger point will be washed with a germ-killing (antiseptic) solution.  A thin needle is used for the shot. You may feel pain or a twitching feeling when the needle enters the trigger point.  A numbing solution may be injected into the trigger point. Sometimes a medicine to keep down swelling, redness, and warmth (inflammation) is  also injected.  Your health care provider may move the needle around the area where the trigger point is located until the tightness and twitching goes away.  After the injection, your health care provider may put gentle pressure over the injection site.  The injection site will be covered with a bandage (dressing). The procedure may vary among health care providers and hospitals. What happens after the procedure?  The dressing can be taken off in a few hours or as told by your health care provider.  You may feel sore and stiff for 1-2 days. This information is not intended to replace advice given to you by your health care provider. Make sure you discuss any questions you have with your health care provider. Document Released: 12/19/2010 Document Revised: 09/02/2015 Document Reviewed: 06/19/2014 Elsevier Interactive Patient Education  2018 [employer].

## 2017-01-28 DIAGNOSIS — I639 Cerebral infarction, unspecified: Secondary | ICD-10-CM | POA: Diagnosis not present

## 2017-01-28 DIAGNOSIS — E7800 Pure hypercholesterolemia, unspecified: Secondary | ICD-10-CM | POA: Diagnosis not present

## 2017-01-28 DIAGNOSIS — E1140 Type 2 diabetes mellitus with diabetic neuropathy, unspecified: Secondary | ICD-10-CM | POA: Diagnosis not present

## 2017-01-28 DIAGNOSIS — I10 Essential (primary) hypertension: Secondary | ICD-10-CM | POA: Diagnosis not present

## 2017-02-05 DIAGNOSIS — K828 Other specified diseases of gallbladder: Secondary | ICD-10-CM | POA: Diagnosis not present

## 2017-02-05 DIAGNOSIS — R1011 Right upper quadrant pain: Secondary | ICD-10-CM | POA: Diagnosis not present

## 2017-02-05 DIAGNOSIS — K760 Fatty (change of) liver, not elsewhere classified: Secondary | ICD-10-CM | POA: Diagnosis not present

## 2017-02-05 DIAGNOSIS — R1013 Epigastric pain: Secondary | ICD-10-CM | POA: Diagnosis not present

## 2017-02-20 ENCOUNTER — Encounter: Payer: Federal, State, Local not specified - PPO | Attending: Physical Medicine & Rehabilitation

## 2017-02-20 ENCOUNTER — Ambulatory Visit (HOSPITAL_BASED_OUTPATIENT_CLINIC_OR_DEPARTMENT_OTHER): Payer: Federal, State, Local not specified - PPO | Admitting: Physical Medicine & Rehabilitation

## 2017-02-20 ENCOUNTER — Encounter: Payer: Self-pay | Admitting: Physical Medicine & Rehabilitation

## 2017-02-20 VITALS — BP 114/77 | HR 82 | Resp 14

## 2017-02-20 DIAGNOSIS — G43909 Migraine, unspecified, not intractable, without status migrainosus: Secondary | ICD-10-CM | POA: Insufficient documentation

## 2017-02-20 DIAGNOSIS — Z9889 Other specified postprocedural states: Secondary | ICD-10-CM | POA: Insufficient documentation

## 2017-02-20 DIAGNOSIS — D649 Anemia, unspecified: Secondary | ICD-10-CM | POA: Insufficient documentation

## 2017-02-20 DIAGNOSIS — G8111 Spastic hemiplegia affecting right dominant side: Secondary | ICD-10-CM | POA: Diagnosis not present

## 2017-02-20 DIAGNOSIS — I69351 Hemiplegia and hemiparesis following cerebral infarction affecting right dominant side: Secondary | ICD-10-CM | POA: Diagnosis not present

## 2017-02-20 DIAGNOSIS — E1140 Type 2 diabetes mellitus with diabetic neuropathy, unspecified: Secondary | ICD-10-CM | POA: Diagnosis not present

## 2017-02-20 DIAGNOSIS — S93491A Sprain of other ligament of right ankle, initial encounter: Secondary | ICD-10-CM | POA: Insufficient documentation

## 2017-02-20 DIAGNOSIS — G8929 Other chronic pain: Secondary | ICD-10-CM | POA: Insufficient documentation

## 2017-02-20 DIAGNOSIS — I10 Essential (primary) hypertension: Secondary | ICD-10-CM | POA: Insufficient documentation

## 2017-02-20 DIAGNOSIS — E119 Type 2 diabetes mellitus without complications: Secondary | ICD-10-CM | POA: Diagnosis not present

## 2017-02-20 DIAGNOSIS — Z79891 Long term (current) use of opiate analgesic: Secondary | ICD-10-CM | POA: Diagnosis not present

## 2017-02-20 DIAGNOSIS — M545 Low back pain: Secondary | ICD-10-CM | POA: Insufficient documentation

## 2017-02-20 DIAGNOSIS — M7918 Myalgia, other site: Secondary | ICD-10-CM

## 2017-02-20 DIAGNOSIS — Z841 Family history of disorders of kidney and ureter: Secondary | ICD-10-CM | POA: Insufficient documentation

## 2017-02-20 DIAGNOSIS — S76012A Strain of muscle, fascia and tendon of left hip, initial encounter: Secondary | ICD-10-CM

## 2017-02-20 DIAGNOSIS — Z5181 Encounter for therapeutic drug level monitoring: Secondary | ICD-10-CM | POA: Diagnosis not present

## 2017-02-20 DIAGNOSIS — Z823 Family history of stroke: Secondary | ICD-10-CM | POA: Diagnosis not present

## 2017-02-20 DIAGNOSIS — Z79899 Other long term (current) drug therapy: Secondary | ICD-10-CM | POA: Diagnosis not present

## 2017-02-20 DIAGNOSIS — Z833 Family history of diabetes mellitus: Secondary | ICD-10-CM | POA: Diagnosis not present

## 2017-02-20 MED ORDER — TRAMADOL HCL 50 MG PO TABS: 50.0000 mg | ORAL_TABLET | Freq: Two times a day (BID) | ORAL | 5 refills | Status: DC

## 2017-02-20 NOTE — Patient Instructions (Signed)
Please do crosslegged stretch twice a day

## 2017-02-20 NOTE — Progress Notes (Signed)
Subjective:    Patient ID: Tina [name], female    DOB: [birth year], 47 y.o.   MRN: [id number]  HPI Right periscapular pain improved after trigger point injections performed 1 month ago. Her new complaint is left-sided hip pain.  She has had no falls.  She has had prior sacroiliac injections bilaterally approximately 4 months ago.  Her right hip and SI area are not painful. She denies any pain radiating down the left lower extremity. She continues to ambulate and do all her ADLs without assistance.   Pain Inventory Average Pain 6 Pain Right Now 6 My pain is stabbing and aching  In the last 24 hours, has pain interfered with the following? General activity 10 Relation with others 10 Enjoyment of life 10 What TIME of day is your pain at its worst? all Sleep (in general) Fair  Pain is worse with: walking, bending, sitting, inactivity, standing and some activites Pain improves with: medication Relief from Meds: 7  Mobility walk without assistance  Function Do you have any goals in this area?  no  Neuro/Psych weakness numbness spasms dizziness confusion depression anxiety  Prior Studies Any changes since last visit?  no  Physicians involved in your care Any changes since last visit?  no   Family History  Problem Relation Age of Onset  . Diabetes Father   . Kidney disease Father   . Diabetes Maternal Grandmother   . Hyperlipidemia Paternal Grandmother   . Stroke Paternal Grandmother   . Other Neg Hx    Social History   Socioeconomic History  . Marital status: Married    Spouse name: None  . Number of children: 2  . Years of education: BA degree  . Highest education level: None  Social Needs  . Financial resource strain: None  . Food insecurity - worry: None  . Food insecurity - inability: None  . Transportation needs - medical: None  . Transportation needs - non-medical: None  Occupational History    Employer: UNEMPLOYED  Tobacco Use  .  Smoking status: Never Smoker  . Smokeless tobacco: Never Used  Substance and Sexual Activity  . Alcohol use: No    Alcohol/week: 0.0 oz  . Drug use: No  . Sexual activity: Yes    Birth control/protection: None  Other Topics Concern  . None  Social History Narrative   Patient is married with 2 children.   Patient is right handed.   Patient has her  BA degree.   Patient drinks 1 cup daily.   Past Surgical History:  Procedure Laterality Date  . APPLICATION OF WOUND VAC N/A 05/25/2015   Procedure: APPLICATION OF WOUND VAC;  Surgeon: Michael Boston, MD;  [facility];  Service: General;  Laterality: N/A;  . CESAREAN SECTION  2012  . DIAGNOSTIC LAPAROSCOPY    . ESOPHAGOGASTRODUODENOSCOPY N/A 06/03/2012   Procedure: ESOPHAGOGASTRODUODENOSCOPY (EGD);  Surgeon: [name] Craver, MD;  Location[facility] ENDOSCOPY;  Service: Endoscopy;  Laterality: N/A;  . EXCISION MASS ABDOMINAL N/A 05/25/2015   Procedure: ABDOMINAL WALL EXPLORATION EXCISION OF SEROMA REMOVAL OF REDUNDANT SKIN ;  Surgeon: Michael Boston, MD;  Location[facility];  Service: General;  Laterality: N/A;  . EYE SURGERY     Eye laser for vessel hemorrhaging  . HERNIA REPAIR  10/03/11   ventral hernia repair  . INSERTION OF MESH N/A 02/04/2013   Procedure: INSERTION OF MESH;  Surgeon: Adin Hector, MD;  [facility];  Service: General;  Laterality: N/A;  .  UMBILICAL HERNIA REPAIR N/A 02/04/2013   Procedure: LAPAROSCOPIC ventral wall hernia repair LAPAROSCOPIC LYSIS OF ADHESIONS laparoscopic exploration of abdomen ;  Surgeon: Adin Hector, MD;  Location: [facility];  Service: General;  Laterality: N/A;  . URETER REVISION     Bilateral "twisted"  . UTERINE FIBROID SURGERY     2 SURGERIES FOR FIBROIDS  . VENTRAL HERNIA REPAIR  10/03/2011   Procedure: LAPAROSCOPIC VENTRAL HERNIA;  Surgeon: Adin Hector, MD;  Location: [facility];  Service: General;  Laterality: N/A;   Past Medical History:  Diagnosis Date  . Anemia    DURING MENSES--HAS HEAVY  BLEEDING WITH PERODS  . Anxiety   . Back pain, chronic    "ongoing"  . Blood transfusion    IN 2012  AFTER C -SECTION  . Cerebral thrombosis with cerebral infarction [facility]) JUNE 2011   RIGHT SIDED WEAKNESS ( ARM AND LEG ) AND SPASMS-remains with slight weakness and vertigo.  . Depression   . Diabetes mellitus   . Diabetic neuropathy ([place])    BOTH FEET --COMES AND GOES  . Endometriosis   . H/O eye surgery   . Headache(784.0)    MIGRAINES--NOT REALLY HEADACHE-MORE LIKE PRESSURE SENSATION IN HEAD-FEELS DIZZIY AND  FAINT AS THE PRESSURE RESOLVES  . Hernia, incisional, RLQ, s/p lap repair Sep 2013 09/02/2011  . Hx of migraines 10/19/2011  . Hypertension   . Leg pain, right    "like bad Charley horse"  . Panic disorder without agoraphobia   . Rash    HANDS, ARMS --STATES HX OF RASH EVER SINCE CHILDBIRTH/PREGNANCY.  STATES THE RASH OFTEN OCCURS WHEN SHE IS REALLY STRESSED."goes and comes-presently left ring finger"  . Restless leg syndrome    DIAGNOSED BY SLEEP STUDY - PT TOLD SHE DID NOT HAVE SLEEP APNEA  . Right rotator cuff tear    PAIN IN RIGHT SHOULDER  . SBO (small bowel obstruction) ([place]) 06/03/2012  . Spastic hemiplegia affecting dominant side ([place])   . Stroke ([place])   . Ventral hernia    RIGHT LOWER QUADRANT-CAUSING SOME PAIN   BP 114/77   Pulse 82   Resp 14   SpO2 97%   Opioid Risk Score:   Fall Risk Score:  `1  Depression screen PHQ 2/9  Depression screen [facility] 2/9 01/23/2017 12/12/2016 03/08/2015  Decreased Interest 0 0 2  Down, Depressed, Hopeless 0 0 2  PHQ - 2 Score 0 0 4  Altered sleeping - - 3  Tired, decreased energy - - 3  Change in appetite - - 1  Feeling bad or failure about yourself  - - 2  Trouble concentrating - - 3  Moving slowly or fidgety/restless - - 2  Suicidal thoughts - - 1  PHQ-9 Score - - 19  Difficult doing work/chores - - Somewhat difficult    Review of Systems  HENT: Negative.   Eyes: Negative.   Respiratory: Negative.     Cardiovascular: Negative.   Gastrointestinal: Negative.   Endocrine: Negative.   Genitourinary: Negative.   Musculoskeletal: Positive for arthralgias and myalgias.       Spasms  Neurological: Positive for dizziness, weakness and numbness.       Tingling  Psychiatric/Behavioral: Positive for confusion and dysphoric mood. The patient is nervous/anxious.   All other systems reviewed and are negative.      Objective:   Physical Exam  Constitutional: She is oriented to person, place, and time. She appears well-developed and well-nourished. No distress.  HENT:  Head: Normocephalic and atraumatic.  Eyes: Conjunctivae and EOM are normal. Pupils are equal, round, and reactive to light.  Neck: Normal range of motion. Neck supple.  Pulmonary/Chest: No stridor.  Musculoskeletal:  Tenderness to palpation in the left gluteus medius.  There is no tenderness over the PSIS area sacroiliac area. She does have tightness when she crosses her left leg over her right knee.  She feels this pulling in the buttock area. Lumbar range of motion 50% flexion extension. Hip abduction causes some pain in the gluteal area on the left side only  Neurological: She is alert and oriented to person, place, and time.  Motor strength remains 4/5 in the right upper and right lower limb 5/5 in the left upper and left lower limb Sensation normal in the left lower extremity Ambulates without assistive device without evidence of toe drag or knee instability  Skin: She is not diaphoretic.  Nursing note and vitals reviewed.         Assessment & Plan:  1.  Left gluteus medius strain.  This is her dominant leg because of her right hemiparesis from the stroke.  She has some tightness of the gluteus medius muscle.  Advise stretching over the next 4-6 weeks twice a day.  This was demonstrated to her.  Physical medicine and rehabilitation follow-up in 6 weeks, if not much better would consider trigger point injections to  the left gluteus   Indication for chronic opioid: sacroiliac Medication and dose: Tramadol 50 mg twice daily # pills per month: 60 Last UDS date: 02/20/2017 Pain contract signed (Y/N): 02/20/2017 Date narcotic database last reviewed (include red flags): 02/20/2017.  PCP prescribes Lyrica

## 2017-02-26 DIAGNOSIS — M65342 Trigger finger, left ring finger: Secondary | ICD-10-CM | POA: Diagnosis not present

## 2017-02-26 DIAGNOSIS — M65332 Trigger finger, left middle finger: Secondary | ICD-10-CM | POA: Diagnosis not present

## 2017-02-26 LAB — TOXASSURE SELECT,+ANTIDEPR,UR

## 2017-02-27 ENCOUNTER — Telehealth: Payer: Self-pay | Admitting: *Deleted

## 2017-02-27 NOTE — Telephone Encounter (Signed)
Urine drug screen for this encounter is consistent for prescribed medication 

## 2017-03-05 DIAGNOSIS — F411 Generalized anxiety disorder: Secondary | ICD-10-CM | POA: Diagnosis not present

## 2017-03-05 DIAGNOSIS — F331 Major depressive disorder, recurrent, moderate: Secondary | ICD-10-CM | POA: Diagnosis not present

## 2017-03-06 DIAGNOSIS — K2970 Gastritis, unspecified, without bleeding: Secondary | ICD-10-CM | POA: Diagnosis not present

## 2017-03-06 DIAGNOSIS — R110 Nausea: Secondary | ICD-10-CM | POA: Diagnosis not present

## 2017-03-06 DIAGNOSIS — R1013 Epigastric pain: Secondary | ICD-10-CM | POA: Diagnosis not present

## 2017-03-20 ENCOUNTER — Other Ambulatory Visit: Payer: Self-pay | Admitting: Surgery

## 2017-03-20 DIAGNOSIS — R1011 Right upper quadrant pain: Secondary | ICD-10-CM | POA: Diagnosis not present

## 2017-03-23 DIAGNOSIS — E1140 Type 2 diabetes mellitus with diabetic neuropathy, unspecified: Secondary | ICD-10-CM | POA: Diagnosis not present

## 2017-03-30 DIAGNOSIS — E1140 Type 2 diabetes mellitus with diabetic neuropathy, unspecified: Secondary | ICD-10-CM | POA: Diagnosis not present

## 2017-03-30 DIAGNOSIS — I10 Essential (primary) hypertension: Secondary | ICD-10-CM | POA: Diagnosis not present

## 2017-03-30 DIAGNOSIS — I639 Cerebral infarction, unspecified: Secondary | ICD-10-CM | POA: Diagnosis not present

## 2017-03-30 DIAGNOSIS — E7800 Pure hypercholesterolemia, unspecified: Secondary | ICD-10-CM | POA: Diagnosis not present

## 2017-04-03 ENCOUNTER — Encounter: Payer: Self-pay | Admitting: Physical Medicine & Rehabilitation

## 2017-04-03 ENCOUNTER — Encounter: Payer: Federal, State, Local not specified - PPO | Attending: Physical Medicine & Rehabilitation

## 2017-04-03 ENCOUNTER — Ambulatory Visit (HOSPITAL_BASED_OUTPATIENT_CLINIC_OR_DEPARTMENT_OTHER): Payer: Federal, State, Local not specified - PPO | Admitting: Physical Medicine & Rehabilitation

## 2017-04-03 VITALS — BP 136/84 | HR 83 | Resp 14

## 2017-04-03 DIAGNOSIS — Z833 Family history of diabetes mellitus: Secondary | ICD-10-CM | POA: Insufficient documentation

## 2017-04-03 DIAGNOSIS — E119 Type 2 diabetes mellitus without complications: Secondary | ICD-10-CM | POA: Insufficient documentation

## 2017-04-03 DIAGNOSIS — G43909 Migraine, unspecified, not intractable, without status migrainosus: Secondary | ICD-10-CM | POA: Insufficient documentation

## 2017-04-03 DIAGNOSIS — M545 Low back pain: Secondary | ICD-10-CM | POA: Diagnosis not present

## 2017-04-03 DIAGNOSIS — D649 Anemia, unspecified: Secondary | ICD-10-CM | POA: Diagnosis not present

## 2017-04-03 DIAGNOSIS — G8111 Spastic hemiplegia affecting right dominant side: Secondary | ICD-10-CM | POA: Diagnosis not present

## 2017-04-03 DIAGNOSIS — S93491A Sprain of other ligament of right ankle, initial encounter: Secondary | ICD-10-CM | POA: Diagnosis not present

## 2017-04-03 DIAGNOSIS — Z79891 Long term (current) use of opiate analgesic: Secondary | ICD-10-CM | POA: Diagnosis not present

## 2017-04-03 DIAGNOSIS — M461 Sacroiliitis, not elsewhere classified: Secondary | ICD-10-CM | POA: Diagnosis not present

## 2017-04-03 DIAGNOSIS — G8929 Other chronic pain: Secondary | ICD-10-CM | POA: Diagnosis not present

## 2017-04-03 DIAGNOSIS — Z823 Family history of stroke: Secondary | ICD-10-CM | POA: Insufficient documentation

## 2017-04-03 DIAGNOSIS — Z9889 Other specified postprocedural states: Secondary | ICD-10-CM | POA: Diagnosis not present

## 2017-04-03 DIAGNOSIS — E1140 Type 2 diabetes mellitus with diabetic neuropathy, unspecified: Secondary | ICD-10-CM | POA: Diagnosis not present

## 2017-04-03 DIAGNOSIS — Z841 Family history of disorders of kidney and ureter: Secondary | ICD-10-CM | POA: Diagnosis not present

## 2017-04-03 DIAGNOSIS — I10 Essential (primary) hypertension: Secondary | ICD-10-CM | POA: Insufficient documentation

## 2017-04-03 NOTE — Patient Instructions (Signed)
Sacroiliac injection was performed today. A combination of a naming medicine plus a cortisone medicine was injected. The injection was done under x-ray guidance. This procedure has been performed to help reduce low back and buttocks pain as well as potentially hip pain. The duration of this injection is variable lasting from hours to  Months. It may repeated if needed. 

## 2017-04-03 NOTE — Progress Notes (Signed)

## 2017-04-03 NOTE — Progress Notes (Signed)
  PROCEDURE RECORD [facility]   Name: TEMIMA KUTSCH DOB:Dec 17, 1970 MRN: [id number]  Date:04/03/2017  Physician: Alysia Penna, MD    Nurse/CMA: Diamonte Stavely, CMA  Allergies:  Allergies  Allergen Reactions  . Contrast Media [Iodinated Diagnostic Agents] Other (See Comments)    Difficulty breathing  . Iohexol Hives, Nausea And Vomiting and Swelling     Desc: Magnevist-gadolinium-difficulty breathing, throat swelling   . Midazolam Hcl Anaphylaxis    Difficulty breathing  . Shellfish Allergy Anaphylaxis  . Metformin And Related Diarrhea and Nausea And Vomiting  . Other Itching    Patient is allergic to all nuts except peanuts.   . Avandia [Rosiglitazone Maleate] Hives and Other (See Comments)  . Geodon [Ziprasidone] Other (See Comments)    UNKNOWN  . Kiwi Extract Itching and Swelling  . Latex Itching    Consent Signed: Yes.    Is patient diabetic? Yes.    CBG today? 81  Pregnant: No. LMP: No LMP recorded. (age [age])  Anticoagulants: no Anti-inflammatory: no Antibiotics: no  Procedure: bilateral sacroiliac steroid injection  Position: Prone Start Time:  9:33am       End Time: 9:38am  Fluoro Time: 26  RN/CMA Shanon Becvar, CMA Gregory Dowe, CMA    Time 9:21am 9:41am    BP 136/84     Pulse 83 80    Respirations 14 14    O2 Sat 98 97    S/S 6 6    Pain Level 9/10 2/10     D/C home with husband, patient A & O X 3, D/C instructions reviewed, and sits independently.

## 2017-04-07 ENCOUNTER — Telehealth: Payer: Self-pay | Admitting: Physical Medicine & Rehabilitation

## 2017-04-07 NOTE — Telephone Encounter (Signed)
Pt phoned stating the injection she received for her back did not work and now the pain is worse. She wants to know her other options. Please advise.

## 2017-04-07 NOTE — Telephone Encounter (Signed)
Please make appt for gluteus trigger point injections

## 2017-04-23 ENCOUNTER — Encounter: Payer: Self-pay | Admitting: Physical Medicine & Rehabilitation

## 2017-04-23 ENCOUNTER — Encounter: Payer: Federal, State, Local not specified - PPO | Attending: Physical Medicine & Rehabilitation

## 2017-04-23 ENCOUNTER — Ambulatory Visit (HOSPITAL_BASED_OUTPATIENT_CLINIC_OR_DEPARTMENT_OTHER): Payer: Federal, State, Local not specified - PPO | Admitting: Physical Medicine & Rehabilitation

## 2017-04-23 VITALS — BP 129/77 | HR 85 | Resp 14 | Ht 67.0 in | Wt 249.0 lb

## 2017-04-23 DIAGNOSIS — R269 Unspecified abnormalities of gait and mobility: Secondary | ICD-10-CM | POA: Insufficient documentation

## 2017-04-23 DIAGNOSIS — S76019A Strain of muscle, fascia and tendon of unspecified hip, initial encounter: Secondary | ICD-10-CM | POA: Insufficient documentation

## 2017-04-23 DIAGNOSIS — Z9889 Other specified postprocedural states: Secondary | ICD-10-CM | POA: Diagnosis not present

## 2017-04-23 DIAGNOSIS — Z79891 Long term (current) use of opiate analgesic: Secondary | ICD-10-CM | POA: Diagnosis not present

## 2017-04-23 DIAGNOSIS — I10 Essential (primary) hypertension: Secondary | ICD-10-CM | POA: Insufficient documentation

## 2017-04-23 DIAGNOSIS — M545 Low back pain: Secondary | ICD-10-CM | POA: Diagnosis not present

## 2017-04-23 DIAGNOSIS — E1140 Type 2 diabetes mellitus with diabetic neuropathy, unspecified: Secondary | ICD-10-CM | POA: Diagnosis not present

## 2017-04-23 DIAGNOSIS — D649 Anemia, unspecified: Secondary | ICD-10-CM | POA: Insufficient documentation

## 2017-04-23 DIAGNOSIS — I69398 Other sequelae of cerebral infarction: Secondary | ICD-10-CM | POA: Insufficient documentation

## 2017-04-23 DIAGNOSIS — S76019D Strain of muscle, fascia and tendon of unspecified hip, subsequent encounter: Secondary | ICD-10-CM | POA: Diagnosis not present

## 2017-04-23 DIAGNOSIS — Z833 Family history of diabetes mellitus: Secondary | ICD-10-CM | POA: Diagnosis not present

## 2017-04-23 DIAGNOSIS — G8929 Other chronic pain: Secondary | ICD-10-CM | POA: Diagnosis not present

## 2017-04-23 DIAGNOSIS — E119 Type 2 diabetes mellitus without complications: Secondary | ICD-10-CM | POA: Insufficient documentation

## 2017-04-23 DIAGNOSIS — Z823 Family history of stroke: Secondary | ICD-10-CM | POA: Insufficient documentation

## 2017-04-23 DIAGNOSIS — G43909 Migraine, unspecified, not intractable, without status migrainosus: Secondary | ICD-10-CM | POA: Diagnosis not present

## 2017-04-23 DIAGNOSIS — S93491A Sprain of other ligament of right ankle, initial encounter: Secondary | ICD-10-CM | POA: Diagnosis not present

## 2017-04-23 DIAGNOSIS — Z841 Family history of disorders of kidney and ureter: Secondary | ICD-10-CM | POA: Insufficient documentation

## 2017-04-23 DIAGNOSIS — G8111 Spastic hemiplegia affecting right dominant side: Secondary | ICD-10-CM | POA: Diagnosis not present

## 2017-04-23 NOTE — Procedures (Signed)
Trigger Point Injection  Indication: Lumbar paraspinal and gluteal myofascial pain not relieved by medication management and other conservative care.  Informed consent was obtained after describing risk and benefits of the procedure with the patient, this includes bleeding, bruising, infection and medication side effects.  The patient wishes to proceed and has given written consent.  The patient was placed in a prone position.  The bilateral L5 paraspinal right gluteus medius left gluteus medius area was marked and prepped with Betadine.  It was entered with a 25-gauge 1-1/2 inch needle and 1 mL of 1% lidocaine was injected into each of 4 trigger points, after negative draw back for blood.  The patient tolerated the procedure well.  Post procedure instructions were given.

## 2017-04-23 NOTE — Patient Instructions (Signed)
Trigger Point Injection  Trigger points are areas where you have pain. A trigger point injection is a shot given in the trigger point to help relieve pain for a few days to a few months. Common places for trigger points include:  · The neck.  · The shoulders.  · The upper back.  · The lower back.    A trigger point injection will not cure long-lasting (chronic) pain permanently. These injections do not always work for every person, but for some people they can help to relieve pain for a few days to a few months.  Tell a health care provider about:  · Any allergies you have.  · All medicines you are taking, including vitamins, herbs, eye drops, creams, and over-the-counter medicines.  · Any problems you or family members have had with anesthetic medicines.  · Any blood disorders you have.  · Any surgeries you have had.  · Any medical conditions you have.  What are the risks?  Generally, this is a safe procedure. However, problems may occur, including:  · Infection.  · Bleeding.  · Allergic reaction to the injected medicine.  · Irritation of the skin around the injection site.    What happens before the procedure?  · Ask your health care provider about changing or stopping your regular medicines. This is especially important if you are taking diabetes medicines or blood thinners.  What happens during the procedure?  · Your health care provider will feel for trigger points. A marker may be used to circle the area for the injection.  · The skin over the trigger point will be washed with a germ-killing (antiseptic) solution.  · A thin needle is used for the shot. You may feel pain or a twitching feeling when the needle enters the trigger point.  · A numbing solution may be injected into the trigger point. Sometimes a medicine to keep down swelling, redness, and warmth (inflammation) is also injected.  · Your health care provider may move the needle around the area where the trigger point is located until the tightness  and twitching goes away.  · After the injection, your health care provider may put gentle pressure over the injection site.  · The injection site will be covered with a bandage (dressing).  The procedure may vary among health care providers and hospitals.  What happens after the procedure?  · The dressing can be taken off in a few hours or as told by your health care provider.  · You may feel sore and stiff for 1-2 days.  This information is not intended to replace advice given to you by your health care provider. Make sure you discuss any questions you have with your health care provider.  Document Released: 12/19/2010 Document Revised: 09/02/2015 Document Reviewed: 06/19/2014  Elsevier Interactive Patient Education © 2018 [employer]

## 2017-05-01 DIAGNOSIS — E1140 Type 2 diabetes mellitus with diabetic neuropathy, unspecified: Secondary | ICD-10-CM | POA: Diagnosis not present

## 2017-05-09 ENCOUNTER — Other Ambulatory Visit: Payer: Self-pay | Admitting: Physical Medicine & Rehabilitation

## 2017-05-11 ENCOUNTER — Encounter (HOSPITAL_COMMUNITY): Payer: Self-pay | Admitting: *Deleted

## 2017-05-11 ENCOUNTER — Emergency Department (HOSPITAL_COMMUNITY): Payer: Federal, State, Local not specified - PPO

## 2017-05-11 ENCOUNTER — Emergency Department (HOSPITAL_COMMUNITY)
Admission: EM | Admit: 2017-05-11 | Discharge: 2017-05-11 | Disposition: A | Discharge status: Home / Self Care | Payer: Federal, State, Local not specified - PPO | Attending: Emergency Medicine | Admitting: Emergency Medicine

## 2017-05-11 DIAGNOSIS — I6789 Other cerebrovascular disease: Secondary | ICD-10-CM | POA: Diagnosis not present

## 2017-05-11 DIAGNOSIS — Z79899 Other long term (current) drug therapy: Secondary | ICD-10-CM | POA: Diagnosis not present

## 2017-05-11 DIAGNOSIS — T783XXA Angioneurotic edema, initial encounter: Secondary | ICD-10-CM | POA: Insufficient documentation

## 2017-05-11 DIAGNOSIS — Z7982 Long term (current) use of aspirin: Secondary | ICD-10-CM | POA: Insufficient documentation

## 2017-05-11 DIAGNOSIS — R0602 Shortness of breath: Secondary | ICD-10-CM | POA: Diagnosis not present

## 2017-05-11 DIAGNOSIS — R220 Localized swelling, mass and lump, head: Secondary | ICD-10-CM | POA: Diagnosis not present

## 2017-05-11 DIAGNOSIS — I10 Essential (primary) hypertension: Secondary | ICD-10-CM | POA: Insufficient documentation

## 2017-05-11 DIAGNOSIS — E119 Type 2 diabetes mellitus without complications: Secondary | ICD-10-CM | POA: Insufficient documentation

## 2017-05-11 DIAGNOSIS — R600 Localized edema: Secondary | ICD-10-CM | POA: Diagnosis not present

## 2017-05-11 DIAGNOSIS — I639 Cerebral infarction, unspecified: Secondary | ICD-10-CM | POA: Diagnosis not present

## 2017-05-11 DIAGNOSIS — R200 Anesthesia of skin: Secondary | ICD-10-CM | POA: Diagnosis not present

## 2017-05-11 DIAGNOSIS — Z91040 Latex allergy status: Secondary | ICD-10-CM | POA: Insufficient documentation

## 2017-05-11 DIAGNOSIS — R062 Wheezing: Secondary | ICD-10-CM | POA: Diagnosis not present

## 2017-05-11 DIAGNOSIS — E1165 Type 2 diabetes mellitus with hyperglycemia: Secondary | ICD-10-CM | POA: Diagnosis not present

## 2017-05-11 DIAGNOSIS — Z794 Long term (current) use of insulin: Secondary | ICD-10-CM | POA: Diagnosis not present

## 2017-05-11 DIAGNOSIS — E7800 Pure hypercholesterolemia, unspecified: Secondary | ICD-10-CM | POA: Diagnosis not present

## 2017-05-11 DIAGNOSIS — R05 Cough: Secondary | ICD-10-CM | POA: Diagnosis not present

## 2017-05-11 LAB — CBC WITH DIFFERENTIAL/PLATELET
BASOS ABS: 0 10*3/uL (ref 0.0–0.1)
Basophils Relative: 0 %
EOS ABS: 0.2 10*3/uL (ref 0.0–0.7)
EOS PCT: 2 %
HCT: 38.1 % (ref 36.0–46.0)
Hemoglobin: 12.3 g/dL (ref 12.0–15.0)
Lymphocytes Relative: 16 %
Lymphs Abs: 1.3 10*3/uL (ref 0.7–4.0)
MCH: 26.3 pg (ref 26.0–34.0)
MCHC: 32.3 g/dL (ref 30.0–36.0)
MCV: 81.4 fL (ref 78.0–100.0)
Monocytes Absolute: 0.6 10*3/uL (ref 0.1–1.0)
Monocytes Relative: 7 %
Neutro Abs: 6.2 10*3/uL (ref 1.7–7.7)
Neutrophils Relative %: 75 %
Platelets: 281 10*3/uL (ref 150–400)
RBC: 4.68 MIL/uL (ref 3.87–5.11)
RDW: 15.1 % (ref 11.5–15.5)
WBC: 8.3 10*3/uL (ref 4.0–10.5)

## 2017-05-11 LAB — BASIC METABOLIC PANEL
Anion gap: 13 (ref 5–15)
BUN: 14 mg/dL (ref 6–20)
CALCIUM: 9.6 mg/dL (ref 8.9–10.3)
CO2: 22 mmol/L (ref 22–32)
Chloride: 103 mmol/L (ref 101–111)
Creatinine, Ser: 0.89 mg/dL (ref 0.44–1.00)
GFR calc Af Amer: >60 mL/min (ref >60)
GFR calc non Af Amer: >60 mL/min (ref >60)
Glucose, Bld: 187 mg/dL — ABNORMAL HIGH (ref 65–99)
POTASSIUM: 3.4 mmol/L — AB (ref 3.5–5.1)
SODIUM: 138 mmol/L (ref 135–145)

## 2017-05-11 MED ORDER — DIPHENHYDRAMINE HCL 25 MG PO TABS: 50.0000 mg | ORAL_TABLET | Freq: Three times a day (TID) | ORAL | 0 refills | Status: DC | PRN

## 2017-05-11 MED ORDER — AEROCHAMBER PLUS FLO-VU MEDIUM MISC
1.0000 | Status: DC | PRN
  Administered 2017-05-11: 1
  Filled 2017-05-11: qty 1

## 2017-05-11 MED ORDER — ALBUTEROL SULFATE HFA 108 (90 BASE) MCG/ACT IN AERS
2.0000 | INHALATION_SPRAY | Freq: Once | RESPIRATORY_TRACT | Status: AC
  Administered 2017-05-11: 2 via RESPIRATORY_TRACT
  Filled 2017-05-11: qty 6.7

## 2017-05-11 NOTE — Discharge Instructions (Addendum)
1.  You can no longer take your medication containing valsartan.  You cannot take any medications that are classified as ACE inhibitors or ARB's.  Your physician has already discussed this with you, if you have further questions contact their office about these medications.  An alternative blood pressure medication has been prescribed by your doctor. 2.  You may continue to take Benadryl every 6-8 hours until symptoms improve. 3.  Despite medications, the condition can sometimes worsen significantly.  If you at any time believe that the swelling is increasing or you feel any tightness about your mouth or throat, return to the emergency department immediately.

## 2017-05-11 NOTE — ED Notes (Signed)
Bed: MB31 Expected date:  Expected time:  Means of arrival:  Comments: EMS- 47yo F, allergic reaction

## 2017-05-11 NOTE — ED Triage Notes (Signed)
Per EMS, pt woke up at 445AM this morning with swelling to chin. Pt went to her doctor, had urticaria to chest and back, wheezing in both lungs. Pt given 40mg  DepoMedrol IM at office. Pt given 0.3 epi IM, 50mg  benadryl IM en route to hospital. Pt given 10mg  albuterol 0.5 Atrovent. Pt does not recall any new foods or exposure to possible allergens. Pt denies pain. EMS 12 lead showed normal sinus rhythm.

## 2017-05-11 NOTE — ED Provider Notes (Signed)
[facility] DEPT Provider Note   CSN: [id number] Arrival date & time: 05/11/17  1037     History   Chief Complaint Chief Complaint  Patient presents with  . Allergic Reaction    HPI Tina [name] is a 47 y.o. female.  HPI Patient reports that she was going to her doctor this morning because she has had some cough and cold symptoms that have persisted.  Her children had been sick but they got better.  She reports when she gets a cold from them, it often takes her longer to get better.  She did have temperature up to 101 a couple of days ago.  She reports that this morning when she started awakening in the early hours of morning she noticed that her chin and side of her face felt slightly tight and swollen.  When she got to the doctor's office her lower lip started to swell quite quickly and got very large.  She was treated with a Decadron shot in the office, EMS administered albuterol therapy with ipratropium and an EpiPen.  Patient reports that the swelling seems to have stopped in her lip.  She does not perceive swelling in the back of her throat or her tongue. Past Medical History:  Diagnosis Date  . Anemia    DURING MENSES--HAS HEAVY BLEEDING WITH PERODS  . Anxiety   . Back pain, chronic    "ongoing"  . Blood transfusion    IN 2012  AFTER C -SECTION  . Cerebral thrombosis with cerebral infarction [facility]) JUNE 2011   RIGHT SIDED WEAKNESS ( ARM AND LEG ) AND SPASMS-remains with slight weakness and vertigo.  . Depression   . Diabetes mellitus   . Diabetic neuropathy ([place])    BOTH FEET --COMES AND GOES  . Endometriosis   . H/O eye surgery   . Headache(784.0)    MIGRAINES--NOT REALLY HEADACHE-MORE LIKE PRESSURE SENSATION IN HEAD-FEELS DIZZIY AND  FAINT AS THE PRESSURE RESOLVES  . Hernia, incisional, RLQ, s/p lap repair Sep 2013 09/02/2011  . Hx of migraines 10/19/2011  . Hypertension   . Leg pain, right    "like bad Charley horse"  . Panic  disorder without agoraphobia   . Rash    HANDS, ARMS --STATES HX OF RASH EVER SINCE CHILDBIRTH/PREGNANCY.  STATES THE RASH OFTEN OCCURS WHEN SHE IS REALLY STRESSED."goes and comes-presently left ring finger"  . Restless leg syndrome    DIAGNOSED BY SLEEP STUDY - PT TOLD SHE DID NOT HAVE SLEEP APNEA  . Right rotator cuff tear    PAIN IN RIGHT SHOULDER  . SBO (small bowel obstruction) ([place]) 06/03/2012  . Spastic hemiplegia affecting dominant side ([place])   . Stroke ([place])   . Ventral hernia    RIGHT LOWER QUADRANT-CAUSING SOME PAIN    Patient Active Problem List   Diagnosis Date Noted  . Gait disturbance, post-stroke 04/23/2017  . Strain of gluteus medius 04/23/2017  . OSA (obstructive sleep apnea) 10/17/2015  . Periodic limb movement disorder (PLMD) 10/17/2015  . Essential hypertension 05/26/2015  . Type 2 diabetes mellitus with hyperglycemia, with long-term current use of insulin ([place]) 05/26/2015  . Abdominal wall mass of right lower quadrant 05/25/2015  . Hemiparesis affecting right side as late effect of stroke ([place]) 11/09/2014  . Abdominal wall seroma   . Sepsis due to undetermined organism ([place]) 03/06/2014  . Nausea vomiting and diarrhea 03/06/2014  . Sepsis ([place]) 03/06/2014  . Tension headache 09/01/2013  . Nausea with vomiting ?  Gastroparesis? 03/21/2013  . Spastic hemiplegia affecting dominant side ([place]) 02/16/2013  . Recurrent ventral incisional hernia s/p closure/repair w mesh 02/04/2013 01/26/2013  . Constipation, chronic 01/26/2013  . Abdominal pain, chronic, right lower quadrant 06/03/2012  . History of CVA (cerebrovascular accident) 06/03/2012  . HTN (hypertension) 06/03/2012  . Diabetes mellitus type II, uncontrolled ([place]) 10/16/2011  . Obesity (BMI 30-39.9) 09/02/2011  . Rotator cuff tear 08/25/2011  . Sciatica 06/10/2011  . Paresthesias in right hand 06/10/2011  . Lumbago 05/09/2011  . Paresthesias 05/09/2011    Past Surgical History:  Procedure Laterality Date   . APPLICATION OF WOUND VAC N/A 05/25/2015   Procedure: APPLICATION OF WOUND VAC;  Surgeon: Michael Boston, MD;  Location: [facility] ORS;  Service: General;  Laterality: N/A;  . CESAREAN SECTION  2012  . DIAGNOSTIC LAPAROSCOPY    . ESOPHAGOGASTRODUODENOSCOPY N/A 06/03/2012   Procedure: ESOPHAGOGASTRODUODENOSCOPY (EGD);  Surgeon: [name] Craver, MD;  Location: [facility] ENDOSCOPY;  Service: Endoscopy;  Laterality: N/A;  . EXCISION MASS ABDOMINAL N/A 05/25/2015   Procedure: ABDOMINAL WALL EXPLORATION EXCISION OF SEROMA REMOVAL OF REDUNDANT SKIN ;  Surgeon: Michael Boston, MD;  Location: [facility] ORS;  Service: General;  Laterality: N/A;  . EYE SURGERY     Eye laser for vessel hemorrhaging  . HERNIA REPAIR  10/03/11   ventral hernia repair  . INSERTION OF MESH N/A 02/04/2013   Procedure: INSERTION OF MESH;  Surgeon: Adin Hector, MD;  Location: [facility] ORS;  Service: General;  Laterality: N/A;  . UMBILICAL HERNIA REPAIR N/A 02/04/2013   Procedure: LAPAROSCOPIC ventral wall hernia repair LAPAROSCOPIC LYSIS OF ADHESIONS laparoscopic exploration of abdomen ;  Surgeon: Adin Hector, MD;  Location: [facility] ORS;  Service: General;  Laterality: N/A;  . URETER REVISION     Bilateral "twisted"  . UTERINE FIBROID SURGERY     2 SURGERIES FOR FIBROIDS  . VENTRAL HERNIA REPAIR  10/03/2011   Procedure: LAPAROSCOPIC VENTRAL HERNIA;  Surgeon: Adin Hector, MD;  Location: [facility] ORS;  Service: General;  Laterality: N/A;     OB History    Gravida  1   Para  1   Term  0   Preterm  1   AB  0   Living  1     SAB  0   TAB  0   Ectopic  0   Multiple  0   Live Births               Home Medications    Prior to Admission medications   Medication Sig Start Date End Date Taking? Authorizing Provider  allopurinol (ZYLOPRIM) 100 MG tablet Take 100 mg by mouth every evening. 03/13/14  Yes [provider]  Amlodipine-Valsartan-HCTZ 47-425-95 MG TABS Take 1 tablet by mouth daily.  02/09/16  Yes [provider]    aspirin 325 MG tablet Take 325 mg by mouth at bedtime.    Yes [provider]  colchicine 0.6 MG tablet Take 0.6 mg by mouth daily as needed (For gout flare-up.).  02/14/14  Yes [provider]  gabapentin (NEURONTIN) 600 MG tablet Take 600-1,200 mg by mouth See admin instructions. Take 2 tablets every morning, 1 tablet at noon and 2 tablets every evening   Yes [provider]  hydrochlorothiazide (HYDRODIURIL) 25 MG tablet Take 25 mg by mouth daily. 05/11/17  Yes [provider]  insulin aspart (NOVOLOG) 100 UNIT/ML injection Inject 5-25 Units into the skin 3 (three) times daily with meals. Per sliding  scale--pt uses Omnipod and gets a basal rate   Yes [provider]  Liraglutide (VICTOZA) 18 MG/3ML SOPN Inject 1.2 mg into the skin daily.    Yes [provider]  ONE TOUCH ULTRA TEST test strip  06/30/15  Yes [provider]  predniSONE (DELTASONE) 10 MG tablet  05/11/17  Yes [provider]  tiZANidine (ZANAFLEX) 2 MG tablet TAKE 1 TABLET AT BEDTIME DAILY. CAN TAKE 1/2 TABLET IN THE AM AS NEEDED Patient taking differently: TAKE 1 TABLET AT BEDTIME DAILY. CAN TAKE 1/2 TABLET IN THE AM AS NEEDED muscle spasms. 05/11/17  Yes Kirsteins, Luanna Salk, MD  traMADol (ULTRAM) 50 MG tablet Take 1 tablet (50 mg total) by mouth 2 (two) times daily. 02/20/17  Yes Kirsteins, Luanna Salk, MD  amLODipine (NORVASC) 10 MG tablet Take 1 tablet (10 mg total) by mouth daily. Patient not taking: Reported on 05/11/2017 11/18/15   Pisciotta, Elmyra Ricks, PA-C  diphenhydrAMINE (BENADRYL) 25 MG tablet Take 1 tablet (25 mg total) by mouth every 6 (six) hours. Patient not taking: Reported on 05/11/2017 07/01/16   Jola Schmidt, MD  diphenhydrAMINE (BENADRYL) 25 MG tablet Take 2 tablets (50 mg total) by mouth every 8 (eight) hours as needed for itching or allergies. 05/11/17   Charlesetta Shanks, MD  famotidine (PEPCID) 20 MG tablet Take 1 tablet (20 mg total) by mouth 2 (two)  times daily. Patient not taking: Reported on 05/11/2017 07/01/16   Jola Schmidt, MD  methocarbamol (ROBAXIN) 500 MG tablet Take 1 tablet (500 mg total) by mouth 3 (three) times daily. Patient not taking: Reported on 05/11/2017 09/01/16   [name], MD  ondansetron (ZOFRAN) 4 MG tablet Take 1 tablet (4 mg total) every 8 (eight) hours as needed by mouth for nausea or vomiting. Patient not taking: Reported on 05/11/2017 11/26/16   Petrucelli, Glynda Jaeger, PA-C  rOPINIRole (REQUIP) 0.5 MG tablet Take 1 tablet (0.5 mg total) by mouth at bedtime. Patient not taking: Reported on 05/11/2017 11/26/15   Dohmeier, Asencion Partridge, MD    Family History Family History  Problem Relation Age of Onset  . Diabetes Father   . Kidney disease Father   . Diabetes Maternal Grandmother   . Hyperlipidemia Paternal Grandmother   . Stroke Paternal Grandmother   . Other Neg Hx     Social History Social History   Tobacco Use  . Smoking status: Never Smoker  . Smokeless tobacco: Never Used  Substance Use Topics  . Alcohol use: No    Alcohol/week: 0.0 oz  . Drug use: No     Allergies   Contrast media [iodinated diagnostic agents]; Iohexol; Midazolam hcl; Shellfish allergy; Metformin and related; Other; Avandia [rosiglitazone maleate]; Geodon [ziprasidone]; Kiwi extract; and Latex   Review of Systems Review of Systems 10 Systems reviewed and are negative for acute change except as noted in the HPI.  Physical Exam Updated Vital Signs BP 129/76   Pulse 98   Temp 98.2 F (36.8 C) (Oral)   Resp 20   LMP 04/27/2017   SpO2 100%   Physical Exam  Constitutional: She is oriented to person, place, and time. She appears well-developed and well-nourished.  HENT:  Head: Normocephalic and atraumatic.  Patient has large edema of the lower lip.  There is also some firm edema over the chin.  Floor of the mouth is soft.  Tongue does not have any swelling.  Posterior airway is widely patent without edema of the  tonsillar pillars or soft palate.  Eyes: EOM are normal.  Neck:  No stridor.  Neck is supple.  Firmness of the chin does not extend onto the neck.  Cardiovascular: Normal rate, regular rhythm, normal heart sounds and intact distal pulses.  Pulmonary/Chest:  Patient does not have increased work of breathing.  She does have diffused expiratory wheeze.  Patient is getting a nebulizer therapy as I am examining her.  Abdominal: Soft. She exhibits no distension. There is no tenderness. There is no guarding.  Musculoskeletal: Normal range of motion. She exhibits no edema or deformity.  Neurological: She is alert and oriented to person, place, and time. No cranial nerve deficit. She exhibits normal muscle tone. Coordination normal.  Skin: Skin is warm and dry.  No wheals, hives or erythema over other skin surfaces.  Psychiatric: She has a normal mood and affect.               ED Treatments / Results  Labs (all labs ordered are listed, but only abnormal results are displayed) Labs Reviewed  BASIC METABOLIC PANEL - Abnormal; Notable for the following components:      Result Value   Potassium 3.4 (*)    Glucose, Bld 187 (*)    All other components within normal limits  CBC WITH DIFFERENTIAL/PLATELET    EKG None  Radiology Dg Chest 2 View  Result Date: 05/11/2017 CLINICAL DATA:  Cough for 5 days.  Shortness of breath. EXAM: CHEST - 2 VIEW COMPARISON:  11/18/2015. FINDINGS: Mild cardiac enlargement appears stable allowing for AP technique. There is mild central airway thickening, but no edema, confluent airspace opacity, pleural effusion or pneumothorax. No acute osseous findings are seen. IMPRESSION: Stable chest with mild cardiomegaly and chronic central airway thickening. No acute findings demonstrated. Electronically Signed   By: Richardean Sale M.D.   On: 05/11/2017 12:04    Procedures Procedures (including critical care time)  Medications Ordered in ED Medications    albuterol (PROVENTIL HFA;VENTOLIN HFA) 108 (90 Base) MCG/ACT inhaler 2 puff (has no administration in time range)  AEROCHAMBER PLUS FLO-VU MEDIUM MISC 1 each (has no administration in time range)     Initial Impression / Assessment and Plan / ED Course  I have reviewed the triage vital signs and the nursing notes.  Pertinent labs & imaging results that were available during my care of the patient were reviewed by me and considered in my medical decision making (see chart for details).  Clinical Course as of May 11 1499  Mon May 11, 2017  1235 No Change.  Lower lip remains swollen.  Posterior airway is widely patent.  Can visualize tonsils and uvula.  She does not perceive any worsening.  He still feels a sensation of tingling in the front of the tongue and along the side of her face to the left.   [MP]    Clinical Course User Index [MP] Charlesetta Shanks, MD    Recheck 14: 45 there is been no advancement of angioedema.  The lower lip remains swollen.  No intraoral swelling has developed.  Patient feels that the firmness of her chin is decreasing.  No respiratory distress.  Final Clinical Impressions(s) / ED Diagnoses   Final diagnoses:  Angioedema, initial encounter   Patient had episode of angioedema.  This is very likely due to her valsartan.  No other apparent allergic reactions or significant allergy history.  Patient has been under observation and has not developed any intraoral swelling, difficulty swallowing or breathing.  Although the lower lip  remains enlarged, patient does feel that her chin is starting to diminish.  She is not perceived any advancement into new areas.  Patient is aware of discontinuing her valsartan which was already discussed by her doctor when she was in the office.  She is aware she is to return immediately should she perceive any advancement of the swelling. ED Discharge Orders        Ordered    diphenhydrAMINE (BENADRYL) 25 MG tablet  Every 8 hours PRN      05/11/17 1450       Charlesetta Shanks, MD 05/11/17 1503

## 2017-05-22 ENCOUNTER — Encounter: Payer: Federal, State, Local not specified - PPO | Attending: Physical Medicine & Rehabilitation

## 2017-05-22 ENCOUNTER — Encounter: Payer: Self-pay | Admitting: Physical Medicine & Rehabilitation

## 2017-05-22 ENCOUNTER — Ambulatory Visit (HOSPITAL_BASED_OUTPATIENT_CLINIC_OR_DEPARTMENT_OTHER): Payer: Federal, State, Local not specified - PPO | Admitting: Physical Medicine & Rehabilitation

## 2017-05-22 VITALS — BP 140/80 | HR 81 | Ht 65.5 in | Wt 251.6 lb

## 2017-05-22 DIAGNOSIS — D649 Anemia, unspecified: Secondary | ICD-10-CM | POA: Diagnosis not present

## 2017-05-22 DIAGNOSIS — Z79891 Long term (current) use of opiate analgesic: Secondary | ICD-10-CM | POA: Diagnosis not present

## 2017-05-22 DIAGNOSIS — E119 Type 2 diabetes mellitus without complications: Secondary | ICD-10-CM | POA: Insufficient documentation

## 2017-05-22 DIAGNOSIS — G8111 Spastic hemiplegia affecting right dominant side: Secondary | ICD-10-CM | POA: Diagnosis not present

## 2017-05-22 DIAGNOSIS — E1140 Type 2 diabetes mellitus with diabetic neuropathy, unspecified: Secondary | ICD-10-CM | POA: Insufficient documentation

## 2017-05-22 DIAGNOSIS — I10 Essential (primary) hypertension: Secondary | ICD-10-CM | POA: Diagnosis not present

## 2017-05-22 DIAGNOSIS — S93491A Sprain of other ligament of right ankle, initial encounter: Secondary | ICD-10-CM | POA: Diagnosis not present

## 2017-05-22 DIAGNOSIS — M545 Low back pain: Secondary | ICD-10-CM | POA: Insufficient documentation

## 2017-05-22 DIAGNOSIS — Z9889 Other specified postprocedural states: Secondary | ICD-10-CM | POA: Diagnosis not present

## 2017-05-22 DIAGNOSIS — M533 Sacrococcygeal disorders, not elsewhere classified: Secondary | ICD-10-CM

## 2017-05-22 DIAGNOSIS — I69398 Other sequelae of cerebral infarction: Secondary | ICD-10-CM

## 2017-05-22 DIAGNOSIS — Z841 Family history of disorders of kidney and ureter: Secondary | ICD-10-CM | POA: Insufficient documentation

## 2017-05-22 DIAGNOSIS — Z833 Family history of diabetes mellitus: Secondary | ICD-10-CM | POA: Diagnosis not present

## 2017-05-22 DIAGNOSIS — Z823 Family history of stroke: Secondary | ICD-10-CM | POA: Diagnosis not present

## 2017-05-22 DIAGNOSIS — R269 Unspecified abnormalities of gait and mobility: Secondary | ICD-10-CM

## 2017-05-22 DIAGNOSIS — G43909 Migraine, unspecified, not intractable, without status migrainosus: Secondary | ICD-10-CM | POA: Insufficient documentation

## 2017-05-22 DIAGNOSIS — G8929 Other chronic pain: Secondary | ICD-10-CM | POA: Insufficient documentation

## 2017-05-22 NOTE — Patient Instructions (Signed)
Dr. [facility] [address] [place], [place] [address] [phone]

## 2017-05-22 NOTE — Progress Notes (Signed)
Subjective:    Patient ID: Tina [name], female    DOB: Jul 10, 1970, 47 y.o.   MRN: [id number]  HPI 47 year old female with chronic right hemiparesis secondary to left CVA, left pontine infarct sustained on 06/16/2009. Patient with chronic gait deviation secondary to CVA and has had chronic right-sided low back/buttock pain.  She has had good relief with bilateral sacroiliac injections under fluoroscopic guidance unfortunately duration of response was temporary.  Trigger point injection in the right gluteus area performed approximately 1 month ago was not helpful Patient has had chronic right hemisensory deficits following her stroke but no progressive sensory symptoms and no new weakness. She has no pain in the low back area Pain Inventory Average Pain 7 Pain Right Now 5 My pain is sharp, burning, stabbing and aching  In the last 24 hours, has pain interfered with the following? General activity 8 Relation with others 8 Enjoyment of life 8 What TIME of day is your pain at its worst? all Sleep (in general) Poor  Pain is worse with: walking, standing and some activites Pain improves with: . Relief from Meds: 0  Mobility walk without assistance ability to climb steps?  yes do you drive?  yes  Function disabled: date disabled . I need assistance with the following:  bathing, meal prep, household duties and shopping  Neuro/Psych weakness numbness spasms dizziness confusion depression anxiety  Prior Studies Any changes since last visit?  no  Physicians involved in your care Any changes since last visit?  no   Family History  Problem Relation Age of Onset  . Diabetes Father   . Kidney disease Father   . Diabetes Maternal Grandmother   . Hyperlipidemia Paternal Grandmother   . Stroke Paternal Grandmother   . Other Neg Hx    Social History   Socioeconomic History  . Marital status: Married    Spouse name: Not on file  . Number of children: 2  . Years  of education: BA degree  . Highest education level: Not on file  Occupational History    Employer: UNEMPLOYED  Social Needs  . Financial resource strain: Not on file  . Food insecurity:    Worry: Not on file    Inability: Not on file  . Transportation needs:    Medical: Not on file    Non-medical: Not on file  Tobacco Use  . Smoking status: Never Smoker  . Smokeless tobacco: Never Used  Substance and Sexual Activity  . Alcohol use: No    Alcohol/week: 0.0 oz  . Drug use: No  . Sexual activity: Yes    Birth control/protection: None  Lifestyle  . Physical activity:    Days per week: Not on file    Minutes per session: Not on file  . Stress: Not on file  Relationships  . Social connections:    Talks on phone: Not on file    Gets together: Not on file    Attends religious service: Not on file    Active member of club or organization: Not on file    Attends meetings of clubs or organizations: Not on file    Relationship status: Not on file  Other Topics Concern  . Not on file  Social History Narrative   Patient is married with 2 children.   Patient is right handed.   Patient has her  BA degree.   Patient drinks 1 cup daily.   Past Surgical History:  Procedure Laterality Date  .  APPLICATION OF WOUND VAC N/A 05/25/2015   Procedure: APPLICATION OF WOUND VAC;  Surgeon: Michael Boston, MD;  Location: [facility];  Service: General;  Laterality: N/A;  . CESAREAN SECTION  2012  . DIAGNOSTIC LAPAROSCOPY    . ESOPHAGOGASTRODUODENOSCOPY N/A 06/03/2012   Procedure: ESOPHAGOGASTRODUODENOSCOPY (EGD);  Surgeon: [name] Craver, MD;  Location: [facility] ENDOSCOPY;  Service: Endoscopy;  Laterality: N/A;  . EXCISION MASS ABDOMINAL N/A 05/25/2015   Procedure: ABDOMINAL WALL EXPLORATION EXCISION OF SEROMA REMOVAL OF REDUNDANT SKIN ;  Surgeon: Michael Boston, MD;  Location: [facility];  Service: General;  Laterality: N/A;  . EYE SURGERY     Eye laser for vessel hemorrhaging  . HERNIA REPAIR  10/03/11   ventral  hernia repair  . INSERTION OF MESH N/A 02/04/2013   Procedure: INSERTION OF MESH;  Surgeon: Adin Hector, MD;  Location: [facility];  Service: General;  Laterality: N/A;  . UMBILICAL HERNIA REPAIR N/A 02/04/2013   Procedure: LAPAROSCOPIC ventral wall hernia repair LAPAROSCOPIC LYSIS OF ADHESIONS laparoscopic exploration of abdomen ;  Surgeon: Adin Hector, MD;  Location: [facility];  Service: General;  Laterality: N/A;  . URETER REVISION     Bilateral "twisted"  . UTERINE FIBROID SURGERY     2 SURGERIES FOR FIBROIDS  . VENTRAL HERNIA REPAIR  10/03/2011   Procedure: LAPAROSCOPIC VENTRAL HERNIA;  Surgeon: Adin Hector, MD;  Location: [facility];  Service: General;  Laterality: N/A;   Past Medical History:  Diagnosis Date  . Anemia    DURING MENSES--HAS HEAVY BLEEDING WITH PERODS  . Anxiety   . Back pain, chronic    "ongoing"  . Blood transfusion    IN 2012  AFTER C -SECTION  . Cerebral thrombosis with cerebral infarction [facility]) JUNE 2011   RIGHT SIDED WEAKNESS ( ARM AND LEG ) AND SPASMS-remains with slight weakness and vertigo.  . Depression   . Diabetes mellitus   . Diabetic neuropathy ([place])    BOTH FEET --COMES AND GOES  . Endometriosis   . H/O eye surgery   . Headache(784.0)    MIGRAINES--NOT REALLY HEADACHE-MORE LIKE PRESSURE SENSATION IN HEAD-FEELS DIZZIY AND  FAINT AS THE PRESSURE RESOLVES  . Hernia, incisional, RLQ, s/p lap repair Sep 2013 09/02/2011  . Hx of migraines 10/19/2011  . Hypertension   . Leg pain, right    "like bad Charley horse"  . Panic disorder without agoraphobia   . Rash    HANDS, ARMS --STATES HX OF RASH EVER SINCE CHILDBIRTH/PREGNANCY.  STATES THE RASH OFTEN OCCURS WHEN SHE IS REALLY STRESSED."goes and comes-presently left ring finger"  . Restless leg syndrome    DIAGNOSED BY SLEEP STUDY - PT TOLD SHE DID NOT HAVE SLEEP APNEA  . Right rotator cuff tear    PAIN IN RIGHT SHOULDER  . SBO (small bowel obstruction) ([place]) 06/03/2012  . Spastic hemiplegia affecting  dominant side ([place])   . Stroke ([place])   . Ventral hernia    RIGHT LOWER QUADRANT-CAUSING SOME PAIN   LMP 04/27/2017   Opioid Risk Score:   Fall Risk Score:  `1  Depression screen PHQ 2/9  Depression screen [facility] 2/9 01/23/2017 12/12/2016 03/08/2015  Decreased Interest 0 0 2  Down, Depressed, Hopeless 0 0 2  PHQ - 2 Score 0 0 4  Altered sleeping - - 3  Tired, decreased energy - - 3  Change in appetite - - 1  Feeling bad or failure about yourself  - - 2  Trouble concentrating - -  3  Moving slowly or fidgety/restless - - 2  Suicidal thoughts - - 1  PHQ-9 Score - - 19  Difficult doing work/chores - - Somewhat difficult     Review of Systems  Constitutional: Negative.   HENT: Negative.   Eyes: Negative.   Respiratory: Negative.   Cardiovascular: Negative.   Gastrointestinal: Negative.   Endocrine: Negative.   Genitourinary: Negative.   Musculoskeletal: Positive for arthralgias, back pain and myalgias.  Skin: Negative.   Allergic/Immunologic: Negative.   Neurological: Positive for dizziness, weakness and numbness.  Hematological: Negative.   Psychiatric/Behavioral: Positive for confusion and dysphoric mood. The patient is nervous/anxious.   All other systems reviewed and are negative.      Objective:   Physical Exam  Constitutional: She is oriented to person, place, and time. She appears well-developed and well-nourished.  HENT:  Head: Normocephalic and atraumatic.  Eyes: Pupils are equal, round, and reactive to light. EOM are normal.  Neurological: She is alert and oriented to person, place, and time.  Nursing note and vitals reviewed.   Motor strength is 4/5 in the right deltoid bicep tricep grip hip flexor knee extensor ankle dorsiflexor Gait has mild stiff leg gait gait with increased foot clearance compensatory strategy.  Back has tenderness over the right PSIS no tenderness above L5 area Negative straight leg raise Speech without evidence of a aphasia or  dysarthria.     Assessment & Plan:  #1.  Chronic right greater than left buttock pain secondary to sacroiliac disorder.  She has had relief on temporary basis with sacroiliac injections x2.  We discussed treatment options which include chiropractic care, physical therapy, L5 dorsal ramus S1-S2-S3 lateral branch injections, sacroiliac fusion. She has tried physical therapy in the past which was not helpful.  She has not trialed chiropractic.  She wishes to pursue this option.  I will see her in 4 weeks.  If no better consider L5 dorsal ramus S1-S2-S3 lateral branch injections. She will continue her tramadol 50 mg twice daily Zanaflex 2 mg nightly 1 mg every morning

## 2017-06-10 DIAGNOSIS — E7800 Pure hypercholesterolemia, unspecified: Secondary | ICD-10-CM | POA: Diagnosis not present

## 2017-06-10 DIAGNOSIS — I10 Essential (primary) hypertension: Secondary | ICD-10-CM | POA: Diagnosis not present

## 2017-06-10 DIAGNOSIS — I639 Cerebral infarction, unspecified: Secondary | ICD-10-CM | POA: Diagnosis not present

## 2017-06-10 DIAGNOSIS — E1140 Type 2 diabetes mellitus with diabetic neuropathy, unspecified: Secondary | ICD-10-CM | POA: Diagnosis not present

## 2017-06-11 DIAGNOSIS — E7800 Pure hypercholesterolemia, unspecified: Secondary | ICD-10-CM | POA: Diagnosis not present

## 2017-06-11 DIAGNOSIS — E118 Type 2 diabetes mellitus with unspecified complications: Secondary | ICD-10-CM | POA: Diagnosis not present

## 2017-06-11 DIAGNOSIS — I10 Essential (primary) hypertension: Secondary | ICD-10-CM | POA: Diagnosis not present

## 2017-06-19 DIAGNOSIS — E113551 Type 2 diabetes mellitus with stable proliferative diabetic retinopathy, right eye: Secondary | ICD-10-CM | POA: Diagnosis not present

## 2017-06-19 DIAGNOSIS — E113552 Type 2 diabetes mellitus with stable proliferative diabetic retinopathy, left eye: Secondary | ICD-10-CM | POA: Diagnosis not present

## 2017-06-19 DIAGNOSIS — H3561 Retinal hemorrhage, right eye: Secondary | ICD-10-CM | POA: Diagnosis not present

## 2017-06-19 DIAGNOSIS — Z961 Presence of intraocular lens: Secondary | ICD-10-CM | POA: Diagnosis not present

## 2017-06-26 DIAGNOSIS — I10 Essential (primary) hypertension: Secondary | ICD-10-CM | POA: Diagnosis not present

## 2017-06-26 DIAGNOSIS — N200 Calculus of kidney: Secondary | ICD-10-CM | POA: Diagnosis not present

## 2017-06-26 DIAGNOSIS — R109 Unspecified abdominal pain: Secondary | ICD-10-CM | POA: Diagnosis not present

## 2017-06-26 DIAGNOSIS — E7800 Pure hypercholesterolemia, unspecified: Secondary | ICD-10-CM | POA: Diagnosis not present

## 2017-06-26 DIAGNOSIS — E118 Type 2 diabetes mellitus with unspecified complications: Secondary | ICD-10-CM | POA: Diagnosis not present

## 2017-07-03 ENCOUNTER — Ambulatory Visit: Payer: Federal, State, Local not specified - PPO | Admitting: Physical Medicine & Rehabilitation

## 2017-07-03 ENCOUNTER — Encounter: Payer: Federal, State, Local not specified - PPO | Attending: Physical Medicine & Rehabilitation

## 2017-07-03 ENCOUNTER — Encounter: Payer: Self-pay | Admitting: Physical Medicine & Rehabilitation

## 2017-07-03 ENCOUNTER — Ambulatory Visit (HOSPITAL_BASED_OUTPATIENT_CLINIC_OR_DEPARTMENT_OTHER): Payer: Federal, State, Local not specified - PPO | Admitting: Physical Medicine & Rehabilitation

## 2017-07-03 VITALS — BP 144/83 | HR 77 | Ht 66.5 in | Wt 246.8 lb

## 2017-07-03 DIAGNOSIS — Z841 Family history of disorders of kidney and ureter: Secondary | ICD-10-CM | POA: Insufficient documentation

## 2017-07-03 DIAGNOSIS — I10 Essential (primary) hypertension: Secondary | ICD-10-CM | POA: Insufficient documentation

## 2017-07-03 DIAGNOSIS — I69398 Other sequelae of cerebral infarction: Secondary | ICD-10-CM | POA: Diagnosis not present

## 2017-07-03 DIAGNOSIS — Z833 Family history of diabetes mellitus: Secondary | ICD-10-CM | POA: Insufficient documentation

## 2017-07-03 DIAGNOSIS — M545 Low back pain: Secondary | ICD-10-CM | POA: Diagnosis not present

## 2017-07-03 DIAGNOSIS — M533 Sacrococcygeal disorders, not elsewhere classified: Secondary | ICD-10-CM

## 2017-07-03 DIAGNOSIS — Z9889 Other specified postprocedural states: Secondary | ICD-10-CM | POA: Diagnosis not present

## 2017-07-03 DIAGNOSIS — S93491A Sprain of other ligament of right ankle, initial encounter: Secondary | ICD-10-CM | POA: Insufficient documentation

## 2017-07-03 DIAGNOSIS — E1140 Type 2 diabetes mellitus with diabetic neuropathy, unspecified: Secondary | ICD-10-CM | POA: Insufficient documentation

## 2017-07-03 DIAGNOSIS — I69351 Hemiplegia and hemiparesis following cerebral infarction affecting right dominant side: Secondary | ICD-10-CM

## 2017-07-03 DIAGNOSIS — Z79891 Long term (current) use of opiate analgesic: Secondary | ICD-10-CM | POA: Insufficient documentation

## 2017-07-03 DIAGNOSIS — E119 Type 2 diabetes mellitus without complications: Secondary | ICD-10-CM | POA: Diagnosis not present

## 2017-07-03 DIAGNOSIS — G8929 Other chronic pain: Secondary | ICD-10-CM | POA: Insufficient documentation

## 2017-07-03 DIAGNOSIS — Z823 Family history of stroke: Secondary | ICD-10-CM | POA: Insufficient documentation

## 2017-07-03 DIAGNOSIS — D649 Anemia, unspecified: Secondary | ICD-10-CM | POA: Diagnosis not present

## 2017-07-03 DIAGNOSIS — G8111 Spastic hemiplegia affecting right dominant side: Secondary | ICD-10-CM | POA: Diagnosis not present

## 2017-07-03 DIAGNOSIS — R269 Unspecified abnormalities of gait and mobility: Secondary | ICD-10-CM | POA: Diagnosis not present

## 2017-07-03 DIAGNOSIS — G43909 Migraine, unspecified, not intractable, without status migrainosus: Secondary | ICD-10-CM | POA: Diagnosis not present

## 2017-07-03 MED ORDER — TRAMADOL HCL 50 MG PO TABS
50.0000 mg | ORAL_TABLET | Freq: Three times a day (TID) | ORAL | 5 refills | Status: DC | PRN
Start: 2017-07-03 — End: 2017-09-18

## 2017-07-03 NOTE — Progress Notes (Signed)
Subjective:    Patient ID: Tina [name], female    DOB: [birth year], 47 y.o.   MRN: [id number]  HPI 47 year old female with left pontine and left basal ganglia infarct resulting in chronic right hemiparesis.  She has had a good recovery and has mild residual weakness.  She still drops objects of the right upper extremity.  She still has alteration in her gait with stiff leg gait on the right which has resulted in right sacroiliac dysfunction.  She had a short-term relief with sacroiliac injection 04/03/2017. She is wondering about her pain medications.  She takes tramadol twice a day.  Unfortunately her pain comes back before the next dose is due.  She is quite active with her 3 children ages 17, 60 and 5 Her tone is managed with the tizanidine 2 mg at night.  This makes her very tired and cannot take it during the day. Pain Inventory Average Pain 5 Pain Right Now 5 My pain is sharp, burning, stabbing and aching  In the last 24 hours, has pain interfered with the following? General activity 6 Relation with others 6 Enjoyment of life 6 What TIME of day is your pain at its worst? all Sleep (in general) Fair  Pain is worse with: walking, standing and some activites Pain improves with: medication Relief from Meds: 7  Mobility use a cane ability to climb steps?  yes  Function disabled: date disabled . I need assistance with the following:  bathing, meal prep, household duties and shopping  Neuro/Psych weakness tingling spasms dizziness confusion depression anxiety  Prior Studies Any changes since last visit?  no  Physicians involved in your care Any changes since last visit?  no   Family History  Problem Relation Age of Onset  . Diabetes Father   . Kidney disease Father   . Diabetes Maternal Grandmother   . Hyperlipidemia Paternal Grandmother   . Stroke Paternal Grandmother   . Other Neg Hx    Social History   Socioeconomic History  . Marital status: Married      Spouse name: Not on file  . Number of children: 2  . Years of education: BA degree  . Highest education level: Not on file  Occupational History    Employer: UNEMPLOYED  Social Needs  . Financial resource strain: Not on file  . Food insecurity:    Worry: Not on file    Inability: Not on file  . Transportation needs:    Medical: Not on file    Non-medical: Not on file  Tobacco Use  . Smoking status: Never Smoker  . Smokeless tobacco: Never Used  Substance and Sexual Activity  . Alcohol use: No    Alcohol/week: 0.0 oz  . Drug use: No  . Sexual activity: Yes    Birth control/protection: None  Lifestyle  . Physical activity:    Days per week: Not on file    Minutes per session: Not on file  . Stress: Not on file  Relationships  . Social connections:    Talks on phone: Not on file    Gets together: Not on file    Attends religious service: Not on file    Active member of club or organization: Not on file    Attends meetings of clubs or organizations: Not on file    Relationship status: Not on file  Other Topics Concern  . Not on file  Social History Narrative   Patient is married with 2 children.  Patient is right handed.   Patient has her  BA degree.   Patient drinks 1 cup daily.   Past Surgical History:  Procedure Laterality Date  . APPLICATION OF WOUND VAC N/A 05/25/2015   Procedure: APPLICATION OF WOUND VAC;  Surgeon: Michael Boston, MD;  Location: [facility];  Service: General;  Laterality: N/A;  . CESAREAN SECTION  2012  . DIAGNOSTIC LAPAROSCOPY    . ESOPHAGOGASTRODUODENOSCOPY N/A 06/03/2012   Procedure: ESOPHAGOGASTRODUODENOSCOPY (EGD);  Surgeon: [name] Craver, MD;  Location: [facility] ENDOSCOPY;  Service: Endoscopy;  Laterality: N/A;  . EXCISION MASS ABDOMINAL N/A 05/25/2015   Procedure: ABDOMINAL WALL EXPLORATION EXCISION OF SEROMA REMOVAL OF REDUNDANT SKIN ;  Surgeon: Michael Boston, MD;  Location: [facility];  Service: General;  Laterality: N/A;  . EYE SURGERY     Eye laser  for vessel hemorrhaging  . HERNIA REPAIR  10/03/11   ventral hernia repair  . INSERTION OF MESH N/A 02/04/2013   Procedure: INSERTION OF MESH;  Surgeon: Adin Hector, MD;  Location: [facility];  Service: General;  Laterality: N/A;  . UMBILICAL HERNIA REPAIR N/A 02/04/2013   Procedure: LAPAROSCOPIC ventral wall hernia repair LAPAROSCOPIC LYSIS OF ADHESIONS laparoscopic exploration of abdomen ;  Surgeon: Adin Hector, MD;  Location: [facility];  Service: General;  Laterality: N/A;  . URETER REVISION     Bilateral "twisted"  . UTERINE FIBROID SURGERY     2 SURGERIES FOR FIBROIDS  . VENTRAL HERNIA REPAIR  10/03/2011   Procedure: LAPAROSCOPIC VENTRAL HERNIA;  Surgeon: Adin Hector, MD;  Location: [facility];  Service: General;  Laterality: N/A;   Past Medical History:  Diagnosis Date  . Anemia    DURING MENSES--HAS HEAVY BLEEDING WITH PERODS  . Anxiety   . Back pain, chronic    "ongoing"  . Blood transfusion    IN 2012  AFTER C -SECTION  . Cerebral thrombosis with cerebral infarction [facility]) JUNE 2011   RIGHT SIDED WEAKNESS ( ARM AND LEG ) AND SPASMS-remains with slight weakness and vertigo.  . Depression   . Diabetes mellitus   . Diabetic neuropathy ([place])    BOTH FEET --COMES AND GOES  . Endometriosis   . H/O eye surgery   . Headache(784.0)    MIGRAINES--NOT REALLY HEADACHE-MORE LIKE PRESSURE SENSATION IN HEAD-FEELS DIZZIY AND  FAINT AS THE PRESSURE RESOLVES  . Hernia, incisional, RLQ, s/p lap repair Sep 2013 09/02/2011  . Hx of migraines 10/19/2011  . Hypertension   . Leg pain, right    "like bad Charley horse"  . Panic disorder without agoraphobia   . Rash    HANDS, ARMS --STATES HX OF RASH EVER SINCE CHILDBIRTH/PREGNANCY.  STATES THE RASH OFTEN OCCURS WHEN SHE IS REALLY STRESSED."goes and comes-presently left ring finger"  . Restless leg syndrome    DIAGNOSED BY SLEEP STUDY - PT TOLD SHE DID NOT HAVE SLEEP APNEA  . Right rotator cuff tear    PAIN IN RIGHT SHOULDER  . SBO (small bowel  obstruction) ([place]) 06/03/2012  . Spastic hemiplegia affecting dominant side ([place])   . Stroke ([place])   . Ventral hernia    RIGHT LOWER QUADRANT-CAUSING SOME PAIN   There were no vitals taken for this visit.  Opioid Risk Score:   Fall Risk Score:  `1  Depression screen PHQ 2/9  Depression screen [facility] 2/9 01/23/2017 12/12/2016 03/08/2015  Decreased Interest 0 0 2  Down, Depressed, Hopeless 0 0 2  PHQ - 2 Score 0 0 4  Altered sleeping - - 3  Tired, decreased energy - - 3  Change in appetite - - 1  Feeling bad or failure about yourself  - - 2  Trouble concentrating - - 3  Moving slowly or fidgety/restless - - 2  Suicidal thoughts - - 1  PHQ-9 Score - - 19  Difficult doing work/chores - - Somewhat difficult     Review of Systems  Constitutional: Negative.   HENT: Negative.   Eyes: Negative.   Respiratory: Negative.   Cardiovascular: Negative.   Gastrointestinal: Negative.   Endocrine: Negative.   Genitourinary: Negative.   Musculoskeletal: Positive for arthralgias, back pain, joint swelling, myalgias and neck pain.  Skin: Negative.   Allergic/Immunologic: Negative.   Neurological: Positive for dizziness, weakness and numbness.  Hematological: Negative.   Psychiatric/Behavioral: Negative.   All other systems reviewed and are negative.      Objective:   Physical Exam  Constitutional: She is oriented to person, place, and time. She appears well-developed and well-nourished. No distress.  HENT:  Head: Normocephalic and atraumatic.  Eyes: Pupils are equal, round, and reactive to light. EOM are normal.  Musculoskeletal: Normal range of motion.  Mild tenderness to palpation bilateral greater trochanters as well as right PSIS area. Negative straight leg raising He does have 75% range with flexion extension lateral bending and rotation.  Neurological: She is alert and oriented to person, place, and time.  Motor strength is 5- in the right deltoid bicep tricep grip hip flexor  knee extensor ankle dorsiflexor There is positive dysdiadochokinesis with rapid alternating supination pronation right upper extremity. She has mildly diminished finger to thumb opposition on the right side.  She has no numbness in the hand to light touch.  Skin: She is not diaphoretic.  Nursing note and vitals reviewed.         Assessment & Plan:  #1.  Left basal ganglia and pontine infarcts causing chronic right hemiparesis which is mild at this point also has some fine motor and coordination deficits.  As discussed patient do not expect these deficits to improve over time. She has a chronic gait disorder that has resulted in sacroiliac disorder on the right side.  Left side is mildly affected as well. She has responded short-term to sacroiliac injections but has not gotten longer-term relief.  She has been trying some chiropractic care as well. I do think it is reasonable for her to go up on her tramadol to 50 mill grams 3 times daily PMP aware website reviewed no red flags Urine toxicology 02/21/2017 was consistent  If this is not helpful I would recommend L5 dorsal ramus L1-L2-L3 lateral branch blocks to evaluate whether a good option for this patient.

## 2017-07-05 ENCOUNTER — Emergency Department (HOSPITAL_COMMUNITY)
Admission: EM | Admit: 2017-07-05 | Discharge: 2017-07-06 | Disposition: A | Discharge status: Home / Self Care | Payer: Federal, State, Local not specified - PPO | Attending: Emergency Medicine | Admitting: Emergency Medicine

## 2017-07-05 ENCOUNTER — Emergency Department (HOSPITAL_BASED_OUTPATIENT_CLINIC_OR_DEPARTMENT_OTHER): Payer: Federal, State, Local not specified - PPO

## 2017-07-05 ENCOUNTER — Emergency Department (HOSPITAL_COMMUNITY): Payer: Federal, State, Local not specified - PPO

## 2017-07-05 ENCOUNTER — Ambulatory Visit (HOSPITAL_COMMUNITY)
Admission: EM | Admit: 2017-07-05 | Discharge: 2017-07-05 | Discharge status: Absent without leave | Payer: Federal, State, Local not specified - PPO

## 2017-07-05 ENCOUNTER — Encounter (HOSPITAL_COMMUNITY): Payer: Self-pay | Admitting: Emergency Medicine

## 2017-07-05 DIAGNOSIS — Z794 Long term (current) use of insulin: Secondary | ICD-10-CM | POA: Diagnosis not present

## 2017-07-05 DIAGNOSIS — I10 Essential (primary) hypertension: Secondary | ICD-10-CM | POA: Diagnosis not present

## 2017-07-05 DIAGNOSIS — Z7982 Long term (current) use of aspirin: Secondary | ICD-10-CM | POA: Diagnosis not present

## 2017-07-05 DIAGNOSIS — M79605 Pain in left leg: Secondary | ICD-10-CM | POA: Insufficient documentation

## 2017-07-05 DIAGNOSIS — R079 Chest pain, unspecified: Secondary | ICD-10-CM | POA: Diagnosis not present

## 2017-07-05 DIAGNOSIS — R0600 Dyspnea, unspecified: Secondary | ICD-10-CM | POA: Diagnosis not present

## 2017-07-05 DIAGNOSIS — R0789 Other chest pain: Secondary | ICD-10-CM | POA: Diagnosis not present

## 2017-07-05 DIAGNOSIS — M79662 Pain in left lower leg: Secondary | ICD-10-CM

## 2017-07-05 DIAGNOSIS — R609 Edema, unspecified: Secondary | ICD-10-CM | POA: Diagnosis not present

## 2017-07-05 DIAGNOSIS — Z79899 Other long term (current) drug therapy: Secondary | ICD-10-CM | POA: Insufficient documentation

## 2017-07-05 DIAGNOSIS — Z8673 Personal history of transient ischemic attack (TIA), and cerebral infarction without residual deficits: Secondary | ICD-10-CM | POA: Diagnosis not present

## 2017-07-05 DIAGNOSIS — E1140 Type 2 diabetes mellitus with diabetic neuropathy, unspecified: Secondary | ICD-10-CM | POA: Diagnosis not present

## 2017-07-05 LAB — POC URINE PREG, ED: PREG TEST UR: NEGATIVE

## 2017-07-05 LAB — BASIC METABOLIC PANEL
ANION GAP: 9 (ref 5–15)
BUN: 10 mg/dL (ref 6–20)
CO2: 27 mmol/L (ref 22–32)
Calcium: 8.9 mg/dL (ref 8.9–10.3)
Chloride: 103 mmol/L (ref 101–111)
Creatinine, Ser: 0.81 mg/dL (ref 0.44–1.00)
GFR calc Af Amer: >60 mL/min (ref >60)
GLUCOSE: 141 mg/dL — AB (ref 65–99)
Potassium: 3.9 mmol/L (ref 3.5–5.1)
SODIUM: 139 mmol/L (ref 135–145)

## 2017-07-05 LAB — I-STAT TROPONIN, ED
TROPONIN I, POC: 0.01 ng/mL (ref 0.00–0.08)
TROPONIN I, POC: 0 ng/mL (ref 0.00–0.08)

## 2017-07-05 LAB — CBG MONITORING, ED: Glucose-Capillary: 106 mg/dL — ABNORMAL HIGH (ref 65–99)

## 2017-07-05 LAB — D-DIMER, QUANTITATIVE: D-Dimer, Quant: 1.12 ug/mL-FEU — ABNORMAL HIGH (ref 0.00–0.50)

## 2017-07-05 LAB — CBC
HCT: 38.4 % (ref 36.0–46.0)
Hemoglobin: 12.1 g/dL (ref 12.0–15.0)
MCH: 25.8 pg — ABNORMAL LOW (ref 26.0–34.0)
MCHC: 31.5 g/dL (ref 30.0–36.0)
MCV: 81.9 fL (ref 78.0–100.0)
Platelets: 317 10*3/uL (ref 150–400)
RBC: 4.69 MIL/uL (ref 3.87–5.11)
RDW: 13.9 % (ref 11.5–15.5)
WBC: 6.1 10*3/uL (ref 4.0–10.5)

## 2017-07-05 LAB — I-STAT BETA HCG BLOOD, ED (MC, WL, AP ONLY): I-stat hCG, quantitative: <5 m[IU]/mL (ref <5)

## 2017-07-05 MED ORDER — KETOROLAC TROMETHAMINE 15 MG/ML IJ SOLN
15.0000 mg | Freq: Once | INTRAMUSCULAR | Status: AC
  Administered 2017-07-05: 15 mg via INTRAVENOUS
  Filled 2017-07-05: qty 1

## 2017-07-05 MED ORDER — TECHNETIUM TO 99M ALBUMIN AGGREGATED
4.2900 | Freq: Once | INTRAVENOUS | Status: AC | PRN
  Administered 2017-07-05: 4.29 via INTRAVENOUS

## 2017-07-05 MED ORDER — TECHNETIUM TC 99M DIETHYLENETRIAME-PENTAACETIC ACID
32.9000 | Freq: Once | INTRAVENOUS | Status: AC | PRN
  Administered 2017-07-05: 32.9 via RESPIRATORY_TRACT

## 2017-07-05 NOTE — Progress Notes (Signed)
VASCULAR LAB PRELIMINARY  PRELIMINARY  PRELIMINARY  PRELIMINARY  Left lower extremity venous duplex completed.    Preliminary report:  There is no DVT or SVT noted in the left lower extremity.  Called report to Nelly Laurence, [facility], RVT 07/05/2017, 5:59 PM

## 2017-07-05 NOTE — ED Notes (Signed)
Spoke with Ludwig Clarks from [employer]. He stated that he would order isotope and come in to complete the scan. MD notified

## 2017-07-05 NOTE — ED Notes (Signed)
Pt was transported to VQ scan

## 2017-07-05 NOTE — ED Notes (Signed)
Pt updated about approx time until VQ scan

## 2017-07-05 NOTE — ED Notes (Signed)
Patient transported to V/Q scan 

## 2017-07-05 NOTE — ED Triage Notes (Addendum)
Pt states last night began having left calf pain and centralized non radiating chest pain. Pt also endorses nausea dizziness headache and shortness of breath. Left calf does feel tight upon assessment.

## 2017-07-05 NOTE — ED Notes (Signed)
Pt also st's she has been having pain in left calf, pain increases with walking

## 2017-07-05 NOTE — ED Provider Notes (Signed)
[facility] EMERGENCY DEPARTMENT Provider Note   CSN: [id number] Arrival date & time: 07/05/17  1423     History   Chief Complaint Chief Complaint  Patient presents with  . Chest Pain    HPI Tina [name] is a 47 y.o. female.  The history is provided by the patient. No language interpreter was used.  Chest Pain      Tina [name] is a 47 y.o. female who presents to the Emergency Department complaining of calf pain, chest pain. She presents to the emergency department for evaluation of left calf pain that began yesterday. She has a history of muscle cramps but this feels different than her prior cramps. She feels a tight pain in the back of her left calf. Her pain is constant and worse with movement and ambulation. Yesterday she began to feel associated chest pressure with dyspnea on exertion. No prior similar symptoms. She denies any fevers, cough, nausea, vomiting, diaphoresis. She has a history of CVA as well as hypertension and diabetes. She is on aspirin daily. She is a non-smoker and does not take any aspirin oxygen therapy. No history of DVT or pulmonary embolism.  Past Medical History:  Diagnosis Date  . Anemia    DURING MENSES--HAS HEAVY BLEEDING WITH PERODS  . Anxiety   . Back pain, chronic    "ongoing"  . Blood transfusion    IN 2012  AFTER C -SECTION  . Cerebral thrombosis with cerebral infarction [facility]) JUNE 2011   RIGHT SIDED WEAKNESS ( ARM AND LEG ) AND SPASMS-remains with slight weakness and vertigo.  . Depression   . Diabetes mellitus   . Diabetic neuropathy ([place])    BOTH FEET --COMES AND GOES  . Endometriosis   . H/O eye surgery   . Headache(784.0)    MIGRAINES--NOT REALLY HEADACHE-MORE LIKE PRESSURE SENSATION IN HEAD-FEELS DIZZIY AND  FAINT AS THE PRESSURE RESOLVES  . Hernia, incisional, RLQ, s/p lap repair Sep 2013 09/02/2011  . Hx of migraines 10/19/2011  . Hypertension   . Leg pain, right    "like bad Charley horse"  .  Panic disorder without agoraphobia   . Rash    HANDS, ARMS --STATES HX OF RASH EVER SINCE CHILDBIRTH/PREGNANCY.  STATES THE RASH OFTEN OCCURS WHEN SHE IS REALLY STRESSED."goes and comes-presently left ring finger"  . Restless leg syndrome    DIAGNOSED BY SLEEP STUDY - PT TOLD SHE DID NOT HAVE SLEEP APNEA  . Right rotator cuff tear    PAIN IN RIGHT SHOULDER  . SBO (small bowel obstruction) ([place]) 06/03/2012  . Spastic hemiplegia affecting dominant side ([place])   . Stroke ([place])   . Ventral hernia    RIGHT LOWER QUADRANT-CAUSING SOME PAIN    Patient Active Problem List   Diagnosis Date Noted  . Gait disturbance, post-stroke 04/23/2017  . Strain of gluteus medius 04/23/2017  . OSA (obstructive sleep apnea) 10/17/2015  . Periodic limb movement disorder (PLMD) 10/17/2015  . Essential hypertension 05/26/2015  . Type 2 diabetes mellitus with hyperglycemia, with long-term current use of insulin ([place]) 05/26/2015  . Abdominal wall mass of right lower quadrant 05/25/2015  . Hemiparesis affecting right side as late effect of stroke ([place]) 11/09/2014  . Abdominal wall seroma   . Sepsis due to undetermined organism ([place]) 03/06/2014  . Nausea vomiting and diarrhea 03/06/2014  . Sepsis ([place]) 03/06/2014  . Tension headache 09/01/2013  . Nausea with vomiting ?Gastroparesis? 03/21/2013  . Spastic hemiplegia affecting dominant side ([place])  02/16/2013  . Recurrent ventral incisional hernia s/p closure/repair w mesh 02/04/2013 01/26/2013  . Constipation, chronic 01/26/2013  . Abdominal pain, chronic, right lower quadrant 06/03/2012  . History of CVA (cerebrovascular accident) 06/03/2012  . HTN (hypertension) 06/03/2012  . Diabetes mellitus type II, uncontrolled ([place]) 10/16/2011  . Obesity (BMI 30-39.9) 09/02/2011  . Rotator cuff tear 08/25/2011  . Sciatica 06/10/2011  . Paresthesias in right hand 06/10/2011  . Lumbago 05/09/2011  . Paresthesias 05/09/2011    Past Surgical History:  Procedure  Laterality Date  . APPLICATION OF WOUND VAC N/A 05/25/2015   Procedure: APPLICATION OF WOUND VAC;  Surgeon: Michael Boston, MD;  Location: [facility];  Service: General;  Laterality: N/A;  . CESAREAN SECTION  2012  . DIAGNOSTIC LAPAROSCOPY    . ESOPHAGOGASTRODUODENOSCOPY N/A 06/03/2012   Procedure: ESOPHAGOGASTRODUODENOSCOPY (EGD);  Surgeon: [name] Craver, MD;  Location: [facility] ENDOSCOPY;  Service: Endoscopy;  Laterality: N/A;  . EXCISION MASS ABDOMINAL N/A 05/25/2015   Procedure: ABDOMINAL WALL EXPLORATION EXCISION OF SEROMA REMOVAL OF REDUNDANT SKIN ;  Surgeon: Michael Boston, MD;  Location: [facility]S;  Service: General;  Laterality: N/A;  . EYE SURGERY     Eye laser for vessel hemorrhaging  . HERNIA REPAIR  10/03/11   ventral hernia repair  . INSERTION OF MESH N/A 02/04/2013   Procedure: INSERTION OF MESH;  Surgeon: Adin Hector, MD;  Location: [facility];  Service: General;  Laterality: N/A;  . UMBILICAL HERNIA REPAIR N/A 02/04/2013   Procedure: LAPAROSCOPIC ventral wall hernia repair LAPAROSCOPIC LYSIS OF ADHESIONS laparoscopic exploration of abdomen ;  Surgeon: Adin Hector, MD;  Location: [facility];  Service: General;  Laterality: N/A;  . URETER REVISION     Bilateral "twisted"  . UTERINE FIBROID SURGERY     2 SURGERIES FOR FIBROIDS  . VENTRAL HERNIA REPAIR  10/03/2011   Procedure: LAPAROSCOPIC VENTRAL HERNIA;  Surgeon: Adin Hector, MD;  Location: [facility];  Service: General;  Laterality: N/A;     OB History    Gravida  1   Para  1   Term  0   Preterm  1   AB  0   Living  1     SAB  0   TAB  0   Ectopic  0   Multiple  0   Live Births               Home Medications    Prior to Admission medications   Medication Sig Start Date End Date Taking? Authorizing Provider  allopurinol (ZYLOPRIM) 100 MG tablet Take 100 mg by mouth every evening. 03/13/14  Yes [provider]  amLODipine (NORVASC) 10 MG tablet Take 1 tablet (10 mg total) by mouth daily. 11/18/15  Yes Pisciotta,  Charna   aspirin 325 MG tablet Take 325 mg by mouth at bedtime.    Yes [provider]  colchicine 0.6 MG tablet Take 0.6 mg by mouth daily as needed (For gout flare-up.).  02/14/14  Yes [provider]  gabapentin (NEURONTIN) 600 MG tablet Take 600-1,200 mg by mouth See admin instructions. Take 2 tablets every morning, 1 tablet at noon and 2 tablets every evening   Yes [provider]  guanFACINE (INTUNIV) 1 MG TB24 ER tablet Take 1 mg by mouth at bedtime.    Yes [provider]  guanFACINE (TENEX) 1 MG tablet Take 1 mg by mouth at bedtime.   Yes [provider]  hydrochlorothiazide (HYDRODIURIL) 50 MG tablet  Take 50 mg by mouth daily.  05/11/17  Yes [provider]  insulin aspart (NOVOLOG) 100 UNIT/ML injection Inject 5-25 Units into the skin 3 (three) times daily with meals. Per sliding scale--pt uses Omnipod and gets a basal rate   Yes [provider]  Liraglutide (VICTOZA) 18 MG/3ML SOPN Inject 0.6 mg into the skin daily.    Yes [provider]  MACA ROOT PO Take 950 mg by mouth 2 (two) times daily. [employer] - organic Maca Root Black, Red, Yellow 950MG  capsule   Yes [provider]  OVER THE COUNTER MEDICATION Take 1 tablet by mouth 2 (two) times daily. hylands - bioplasma   Yes [provider]  tiZANidine (ZANAFLEX) 2 MG tablet TAKE 1 TABLET AT BEDTIME DAILY. CAN TAKE 1/2 TABLET IN THE AM AS NEEDED Patient taking differently: TAKE 1 TABLET AT BEDTIME DAILY. 05/11/17  Yes Kirsteins, Luanna Salk, MD  traMADol (ULTRAM) 50 MG tablet Take 1 tablet (50 mg total) by mouth 3 (three) times daily as needed. 07/03/17  Yes Kirsteins, Luanna Salk, MD  diphenhydrAMINE (BENADRYL) 25 MG tablet Take 1 tablet (25 mg total) by mouth every 6 (six) hours. Patient not taking: Reported on 07/05/2017 07/01/16   Jola Schmidt, MD  naproxen (NAPROSYN) 500 MG tablet Take 1 tablet (500 mg total) by mouth 2 (two) times daily. 07/06/17    Quintella Reichert, MD    Family History Family History  Problem Relation Age of Onset  . Diabetes Father   . Kidney disease Father   . Diabetes Maternal Grandmother   . Hyperlipidemia Paternal Grandmother   . Stroke Paternal Grandmother   . Other Neg Hx     Social History Social History   Tobacco Use  . Smoking status: Never Smoker  . Smokeless tobacco: Never Used  Substance Use Topics  . Alcohol use: No    Alcohol/week: 0.0 oz  . Drug use: No     Allergies   Contrast media [iodinated diagnostic agents]; Iohexol; Midazolam hcl; Shellfish allergy; Valsartan; Metformin and related; Other; Avandia [rosiglitazone maleate]; Geodon [ziprasidone]; Kiwi extract; and Latex   Review of Systems Review of Systems  Cardiovascular: Positive for chest pain.  All other systems reviewed and are negative.    Physical Exam Updated Vital Signs BP 135/71   Pulse 81   Temp 99.4 F (37.4 C) (Oral)   Resp 19   LMP 07/03/2017   SpO2 99%   Physical Exam  Constitutional: She is oriented to person, place, and time. She appears well-developed and well-nourished.  HENT:  Head: Normocephalic and atraumatic.  Cardiovascular: Normal rate and regular rhythm.  No murmur heard. Pulmonary/Chest: Effort normal and breath sounds normal. No respiratory distress.  Abdominal: Soft. There is no tenderness. There is no rebound and no guarding.  Musculoskeletal: She exhibits no tenderness.  2+ DP pulses to bilateral lower extremities. Trace not pitting edema to bilateral lower extremities. There is tenderness to palpation over the left calf with mild fullness to the left calf. Dorsiflexion and plantarflexion of bilateral feet intact.  Neurological: She is alert and oriented to person, place, and time.  5/5 strength in BLE  Skin: Skin is warm and dry.  Psychiatric: She has a normal mood and affect. Her behavior is normal.  Nursing note and vitals reviewed.    ED Treatments / Results  Labs (all  labs ordered are listed, but only abnormal results are displayed) Labs Reviewed  BASIC METABOLIC PANEL - Abnormal; Notable for the following  components:      Result Value   Glucose, Bld 141 (*)    All other components within normal limits  CBC - Abnormal; Notable for the following components:   MCH 25.8 (*)    All other components within normal limits  D-DIMER, QUANTITATIVE (NOT AT [facility]) - Abnormal; Notable for the following components:   D-Dimer, Quant 1.12 (*)    All other components within normal limits  CBG MONITORING, ED - Abnormal; Notable for the following components:   Glucose-Capillary 106 (*)    All other components within normal limits  I-STAT TROPONIN, ED  I-STAT BETA HCG BLOOD, ED ([facility], [facility], [facility] ONLY)  POC URINE PREG, ED  I-STAT TROPONIN, ED    EKG EKG Interpretation  Date/Time:  Sunday July 05 2017 14:31:46 EDT Ventricular Rate:  84 PR Interval:  144 QRS Duration: 74 QT Interval:  390 QTC Calculation: 460 R Axis:   46 Text Interpretation:  Normal sinus rhythm Normal ECG Confirmed by Quintella Reichert [phone]) on 07/05/2017 3:45:24 PM   Radiology Dg Chest 2 View  Result Date: 07/05/2017 CLINICAL DATA:  Chest pain. EXAM: CHEST - 2 VIEW COMPARISON:  05/11/2017 FINDINGS: The cardiac silhouette is upper limits of normal in size. The lungs are well inflated and clear. There is no evidence of pleural effusion or pneumothorax. No acute osseous abnormality is identified. IMPRESSION: No active cardiopulmonary disease. Electronically Signed   By: Logan Bores M.D.   On: 07/05/2017 15:27   Nm Pulmonary Vent And Perf (v/q Scan)  Result Date: 07/06/2017 CLINICAL DATA:  Left calf pain and central nonradiating chest pain since last night. Dyspnea with dizziness, headache and nausea. EXAM: NUCLEAR MEDICINE VENTILATION - PERFUSION LUNG SCAN TECHNIQUE: Ventilation images were obtained in multiple projections using inhaled aerosol Tc-70m DTPA. Perfusion images were obtained in multiple  projections after intravenous injection of Tc-76m-MAA. RADIOPHARMACEUTICALS:  32.9 mCi of Tc-39m DTPA aerosol inhalation and 4.29 mCi Tc12m-MAA IV COMPARISON:  CXR 07/05/2017 FINDINGS: Ventilation: No focal ventilation defect. Perfusion: No wedge shaped peripheral perfusion defects to suggest acute pulmonary embolism. Same day chest radiograph is unremarkable. IMPRESSION: Normal lung scan. No moderate or large perfusion defects. No V/Q mismatches. Electronically Signed   By: Ashley Royalty M.D.   On: 07/06/2017 00:20    Procedures Procedures (including critical care time)  Medications Ordered in ED Medications  gi cocktail (Maalox,Lidocaine,Donnatal) (has no administration in time range)  fentaNYL (SUBLIMAZE) injection 50 mcg (has no administration in time range)  ketorolac (TORADOL) 15 MG/ML injection 15 mg (15 mg Intravenous Given 07/05/17 2124)  technetium TC 55M diethylenetriame-pentaacetic acid (DTPA) injection 10.2 millicurie (72.5 millicuries Inhalation Given 07/05/17 2250)  technetium albumin aggregated (MAA) injection solution 3.66 millicurie (4.40 millicuries Intravenous Contrast Given 07/05/17 2324)     Initial Impression / Assessment and Plan / ED Course  I have reviewed the triage vital signs and the nursing notes.  Pertinent labs & imaging results that were available during my care of the patient were reviewed by me and considered in my medical decision making (see chart for details).     Patient here for evaluation of chest pain and leg pain. No risk factors for DVT or PE. Vascular ultrasound is negative for lower extremity DVT. VQ scan is low risk. Presentation is not consistent with ACS for dissection. Discussed with patient home care for musculoskeletal chest pain as well as calf pain. Discussed outpatient follow-up as well as return precautions.  Final Clinical Impressions(s) / ED Diagnoses  Final diagnoses:  Atypical chest pain  Pain of left calf    ED Discharge Orders         Ordered    naproxen (NAPROSYN) 500 MG tablet  2 times daily     07/06/17 0005       Quintella Reichert, MD 07/06/17 [phone]

## 2017-07-05 NOTE — ED Notes (Signed)
Patient transported to X-ray 

## 2017-07-06 ENCOUNTER — Telehealth: Payer: Self-pay

## 2017-07-06 DIAGNOSIS — M79605 Pain in left leg: Secondary | ICD-10-CM | POA: Diagnosis not present

## 2017-07-06 DIAGNOSIS — R079 Chest pain, unspecified: Secondary | ICD-10-CM | POA: Diagnosis not present

## 2017-07-06 DIAGNOSIS — R0789 Other chest pain: Secondary | ICD-10-CM | POA: Diagnosis not present

## 2017-07-06 LAB — CBG MONITORING, ED: GLUCOSE-CAPILLARY: 164 mg/dL — AB (ref 65–99)

## 2017-07-06 MED ORDER — GI COCKTAIL ~~LOC~~
30.0000 mL | Freq: Once | ORAL | Status: AC
  Administered 2017-07-06: 30 mL via ORAL
  Filled 2017-07-06: qty 30

## 2017-07-06 MED ORDER — FENTANYL CITRATE (PF) 100 MCG/2ML IJ SOLN
50.0000 ug | Freq: Once | INTRAMUSCULAR | Status: AC
Start: 2017-07-06 — End: 2017-07-06
  Administered 2017-07-06: 50 ug via INTRAVENOUS
  Filled 2017-07-06: qty 2

## 2017-07-06 MED ORDER — NAPROXEN 500 MG PO TABS: 500.0000 mg | ORAL_TABLET | Freq: Two times a day (BID) | ORAL | 0 refills | Status: DC

## 2017-07-06 NOTE — Telephone Encounter (Signed)
Pt called stating she is having pain in her left leg and she went to ED yesterday, was given Fentanyl. She wanted to see Dr. Letta Pate today. Called pt to let her know that Dr. Letta Pate was out of the office and to see PCP per [place]. No answer left voicemail message.

## 2017-07-06 NOTE — ED Notes (Signed)
Pt understood dc material. NAD noted. Script given at dc  

## 2017-07-10 DIAGNOSIS — E1165 Type 2 diabetes mellitus with hyperglycemia: Secondary | ICD-10-CM | POA: Diagnosis not present

## 2017-07-10 DIAGNOSIS — Z8673 Personal history of transient ischemic attack (TIA), and cerebral infarction without residual deficits: Secondary | ICD-10-CM | POA: Diagnosis not present

## 2017-07-10 DIAGNOSIS — Z794 Long term (current) use of insulin: Secondary | ICD-10-CM | POA: Diagnosis not present

## 2017-07-10 DIAGNOSIS — R0789 Other chest pain: Secondary | ICD-10-CM | POA: Diagnosis not present

## 2017-07-13 DIAGNOSIS — E1140 Type 2 diabetes mellitus with diabetic neuropathy, unspecified: Secondary | ICD-10-CM | POA: Diagnosis not present

## 2017-07-13 DIAGNOSIS — I10 Essential (primary) hypertension: Secondary | ICD-10-CM | POA: Diagnosis not present

## 2017-07-13 NOTE — Telephone Encounter (Signed)
Sounds good thanks for the [facility]

## 2017-07-20 DIAGNOSIS — E139 Other specified diabetes mellitus without complications: Secondary | ICD-10-CM | POA: Diagnosis not present

## 2017-07-20 DIAGNOSIS — R109 Unspecified abdominal pain: Secondary | ICD-10-CM | POA: Diagnosis not present

## 2017-07-20 DIAGNOSIS — E7800 Pure hypercholesterolemia, unspecified: Secondary | ICD-10-CM | POA: Diagnosis not present

## 2017-07-20 DIAGNOSIS — E118 Type 2 diabetes mellitus with unspecified complications: Secondary | ICD-10-CM | POA: Diagnosis not present

## 2017-07-22 DIAGNOSIS — E1140 Type 2 diabetes mellitus with diabetic neuropathy, unspecified: Secondary | ICD-10-CM | POA: Diagnosis not present

## 2017-07-22 DIAGNOSIS — I10 Essential (primary) hypertension: Secondary | ICD-10-CM | POA: Diagnosis not present

## 2017-07-22 DIAGNOSIS — I639 Cerebral infarction, unspecified: Secondary | ICD-10-CM | POA: Diagnosis not present

## 2017-07-22 DIAGNOSIS — E7800 Pure hypercholesterolemia, unspecified: Secondary | ICD-10-CM | POA: Diagnosis not present

## 2017-08-04 ENCOUNTER — Other Ambulatory Visit: Payer: Self-pay | Admitting: Physical Medicine & Rehabilitation

## 2017-08-14 DIAGNOSIS — Z01419 Encounter for gynecological examination (general) (routine) without abnormal findings: Secondary | ICD-10-CM | POA: Diagnosis not present

## 2017-08-14 DIAGNOSIS — Z1231 Encounter for screening mammogram for malignant neoplasm of breast: Secondary | ICD-10-CM | POA: Diagnosis not present

## 2017-08-14 DIAGNOSIS — R102 Pelvic and perineal pain: Secondary | ICD-10-CM | POA: Diagnosis not present

## 2017-08-14 DIAGNOSIS — Z1151 Encounter for screening for human papillomavirus (HPV): Secondary | ICD-10-CM | POA: Diagnosis not present

## 2017-08-17 DIAGNOSIS — T7840XA Allergy, unspecified, initial encounter: Secondary | ICD-10-CM | POA: Diagnosis not present

## 2017-08-20 DIAGNOSIS — I10 Essential (primary) hypertension: Secondary | ICD-10-CM | POA: Diagnosis not present

## 2017-08-21 DIAGNOSIS — E1165 Type 2 diabetes mellitus with hyperglycemia: Secondary | ICD-10-CM | POA: Diagnosis not present

## 2017-08-21 DIAGNOSIS — R0609 Other forms of dyspnea: Secondary | ICD-10-CM | POA: Diagnosis not present

## 2017-08-21 DIAGNOSIS — R0789 Other chest pain: Secondary | ICD-10-CM | POA: Diagnosis not present

## 2017-08-21 DIAGNOSIS — I10 Essential (primary) hypertension: Secondary | ICD-10-CM | POA: Diagnosis not present

## 2017-08-21 DIAGNOSIS — I70211 Atherosclerosis of native arteries of extremities with intermittent claudication, right leg: Secondary | ICD-10-CM | POA: Diagnosis not present

## 2017-08-21 DIAGNOSIS — R079 Chest pain, unspecified: Secondary | ICD-10-CM | POA: Diagnosis not present

## 2017-08-21 DIAGNOSIS — R0602 Shortness of breath: Secondary | ICD-10-CM | POA: Diagnosis not present

## 2017-08-28 DIAGNOSIS — Z794 Long term (current) use of insulin: Secondary | ICD-10-CM | POA: Diagnosis not present

## 2017-08-28 DIAGNOSIS — I10 Essential (primary) hypertension: Secondary | ICD-10-CM | POA: Diagnosis not present

## 2017-08-28 DIAGNOSIS — E1165 Type 2 diabetes mellitus with hyperglycemia: Secondary | ICD-10-CM | POA: Diagnosis not present

## 2017-08-28 DIAGNOSIS — R0789 Other chest pain: Secondary | ICD-10-CM | POA: Diagnosis not present

## 2017-09-04 DIAGNOSIS — I10 Essential (primary) hypertension: Secondary | ICD-10-CM | POA: Diagnosis not present

## 2017-09-04 DIAGNOSIS — E118 Type 2 diabetes mellitus with unspecified complications: Secondary | ICD-10-CM | POA: Diagnosis not present

## 2017-09-08 ENCOUNTER — Ambulatory Visit (INDEPENDENT_AMBULATORY_CARE_PROVIDER_SITE_OTHER): Payer: Federal, State, Local not specified - PPO | Admitting: Allergy and Immunology

## 2017-09-08 ENCOUNTER — Encounter: Payer: Self-pay | Admitting: Allergy and Immunology

## 2017-09-08 VITALS — BP 128/80 | HR 70 | Temp 98.1°F | Resp 16 | Ht 66.0 in | Wt 244.6 lb

## 2017-09-08 DIAGNOSIS — T783XXA Angioneurotic edema, initial encounter: Secondary | ICD-10-CM | POA: Diagnosis not present

## 2017-09-08 DIAGNOSIS — J3089 Other allergic rhinitis: Secondary | ICD-10-CM | POA: Diagnosis not present

## 2017-09-08 DIAGNOSIS — T781XXA Other adverse food reactions, not elsewhere classified, initial encounter: Secondary | ICD-10-CM | POA: Diagnosis not present

## 2017-09-08 DIAGNOSIS — L2089 Other atopic dermatitis: Secondary | ICD-10-CM

## 2017-09-08 DIAGNOSIS — T7800XA Anaphylactic reaction due to unspecified food, initial encounter: Secondary | ICD-10-CM | POA: Diagnosis not present

## 2017-09-08 MED ORDER — MOMETASONE FUROATE 0.1 % EX CREA: TOPICAL_CREAM | CUTANEOUS | 1 refills | Status: DC

## 2017-09-08 MED ORDER — EPINEPHRINE 0.3 MG/0.3ML IJ SOAJ: INTRAMUSCULAR | 2 refills | Status: DC

## 2017-09-08 MED ORDER — MONTELUKAST SODIUM 10 MG PO TABS: 10.0000 mg | ORAL_TABLET | Freq: Every day | ORAL | 5 refills | Status: DC

## 2017-09-08 NOTE — Patient Instructions (Addendum)
  1.  Allergen avoidance measures  2.  Treat and prevent immune activation:   A.  Loratadine 10 mg -1 tablet 2 times a day  B.  Ranitidine 150 mg -1 tablet 2 times a day  C.  Montelukast 10 mg -1 tablet 1 time per day  3.  If needed: EpiPen, Benadryl, MD/ER evaluation for allergic reaction  4.  If needed: Mometasone 0.1% ointment 1-2 times per day  5.  Blood - C4, alpha gal panel, TSH, FT4, TP, nut panel with reflex  6.  Medication side effect?  Victoza (GLP-receptor agonists)?   7.  Return to clinic in 4 weeks or earlier if problem

## 2017-09-08 NOTE — Progress Notes (Signed)
Dear Dr. Theda Sers,  Thank you for referring Tina [name] to the [facility] on 09/08/2017.   Below is a summation of this patient's evaluation and recommendations.  Thank you for your referral. I will keep you informed about this patient's response to treatment.   If you have any questions please do not hesitate to contact me.   Sincerely,  Jiles Prows, MD Allergy / Immunology [facility]   ______________________________________________________________________    NEW PATIENT NOTE  Referring Provider: [name], DO Primary Provider: Jani Gravel, MD Date of office visit: 09/08/2017    Subjective:   Chief Complaint:  Tina [name] (DOB: 1970/06/23) is a 47 y.o. female who presents to the clinic on 09/08/2017 with a chief complaint of New Patient (Initial Visit); Allergy Testing; and Angioedema (face and lips ) .     HPI: Tina [name] presents to this clinic in evaluation of swelling problems.  Apparently in January 2019 she developed very significant lip swelling requiring the administration of systemic steroids.  She did well until March at which point in time she developed chin and lip swelling which evolved into swelling of the entire left side of her face requiring emergency room evaluation and treatment with more systemic steroids.  At that point in time she had a change in her blood pressure medications and it sounds as though she had valsartan removed from her medication plan.  She did well until June at which point time she had another episode of chin swelling for which she self-administered Benadryl and fortunately this reaction resolved over a day or so.  In August she developed lip and chin and left face swelling and once again required more systemic steroids.  She never has any associated systemic or constitutional symptoms with these reactions.  There is no obvious  provoking factor giving rise to these reactions.  She did have a history of developing arm swelling as a young child on 2 occasions without any obvious precipitant and without any evaluation.  As well, she appears to have itchy skin involving her arms which has been a long-standing issue.  This appears to really flareup during the growing season of the year especially in the summer when it is particularly hot.  She also has allergic rhinoconjunctivitis with nasal congestion and sneezing that she feels is pretty mild unless she gets exposure to cat.  As well, sometimes exposure to dog and pollen precipitates that issue.  There is also a history of her developing tongue itching and slight lip swelling and itchiness when she eats watermelon for which she will take Benadryl and continue to eat watermelon.  A similar issue occurs when eating ripe bananas and most tree nuts.  She can eat peanut without a problem.  She has diabetes and has been treated with Victoza for 3 years.  Past Medical History:  Diagnosis Date  . Anemia    DURING MENSES--HAS HEAVY BLEEDING WITH PERODS  . Anxiety   . Back pain, chronic    "ongoing"  . Blood transfusion    IN 2012  AFTER C -SECTION  . Cerebral thrombosis with cerebral infarction [facility]) JUNE 2011   RIGHT SIDED WEAKNESS ( ARM AND LEG ) AND SPASMS-remains with slight weakness and vertigo.  . Depression   . Diabetes mellitus   . Diabetic neuropathy ([place])    BOTH FEET --COMES AND GOES  . Endometriosis   .  H/O eye surgery   . Headache(784.0)    MIGRAINES--NOT REALLY HEADACHE-MORE LIKE PRESSURE SENSATION IN HEAD-FEELS DIZZIY AND  FAINT AS THE PRESSURE RESOLVES  . Hernia, incisional, RLQ, s/p lap repair Sep 2013 09/02/2011  . Hx of migraines 10/19/2011  . Hypertension   . Leg pain, right    "like bad Charley horse"  . Panic disorder without agoraphobia   . Rash    HANDS, ARMS --STATES HX OF RASH EVER SINCE CHILDBIRTH/PREGNANCY.  STATES THE RASH OFTEN OCCURS WHEN  SHE IS REALLY STRESSED."goes and comes-presently left ring finger"  . Restless leg syndrome    DIAGNOSED BY SLEEP STUDY - PT TOLD SHE DID NOT HAVE SLEEP APNEA  . Right rotator cuff tear    PAIN IN RIGHT SHOULDER  . SBO (small bowel obstruction) ([place]) 06/03/2012  . Spastic hemiplegia affecting dominant side ([place])   . Stroke ([place])   . Ventral hernia    RIGHT LOWER QUADRANT-CAUSING SOME PAIN    Past Surgical History:  Procedure Laterality Date  . APPLICATION OF WOUND VAC N/A 05/25/2015   Procedure: APPLICATION OF WOUND VAC;  Surgeon: Michael Boston, MD;  Location: [facility] ORS;  Service: General;  Laterality: N/A;  . CESAREAN SECTION  2012  . DIAGNOSTIC LAPAROSCOPY    . ESOPHAGOGASTRODUODENOSCOPY N/A 06/03/2012   Procedure: ESOPHAGOGASTRODUODENOSCOPY (EGD);  Surgeon: [name] Craver, MD;  Location: [facility] ENDOSCOPY;  Service: Endoscopy;  Laterality: N/A;  . EXCISION MASS ABDOMINAL N/A 05/25/2015   Procedure: ABDOMINAL WALL EXPLORATION EXCISION OF SEROMA REMOVAL OF REDUNDANT SKIN ;  Surgeon: Michael Boston, MD;  Location: [facility] ORS;  Service: General;  Laterality: N/A;  . EYE SURGERY     Eye laser for vessel hemorrhaging  . HERNIA REPAIR  10/03/11   ventral hernia repair  . INSERTION OF MESH N/A 02/04/2013   Procedure: INSERTION OF MESH;  Surgeon: Adin Hector, MD;  Location: [facility];  Service: General;  Laterality: N/A;  . UMBILICAL HERNIA REPAIR N/A 02/04/2013   Procedure: LAPAROSCOPIC ventral wall hernia repair LAPAROSCOPIC LYSIS OF ADHESIONS laparoscopic exploration of abdomen ;  Surgeon: Adin Hector, MD;  Location: [facility] OR[facility];  Service: General;  Laterality: N/A;  . URETER REVISION     Bilateral "twisted"  . UTERINE FIBROID SURGERY     2 SURGERIES FOR FIBROIDS  . VENTRAL HERNIA REPAIR  10/03/2011   Procedure: LAPAROSCOPIC VENTRAL HERNIA;  Surgeon: Adin Hector, MD;  Location: [facility];  Service: General;  Laterality: N/A;    Allergies as of 09/08/2017      Reactions   Contrast Media [iodinated  Diagnostic Agents] Other (See Comments)   Difficulty breathing   Iohexol Hives, Nausea And Vomiting, Swelling    Desc: Magnevist-gadolinium-difficulty breathing, throat swelling   Midazolam Hcl Anaphylaxis   Difficulty breathing   Shellfish Allergy Anaphylaxis   Valsartan Swelling   Metformin And Related Diarrhea, Nausea And Vomiting   Other Itching   Patient is allergic to all nuts except peanuts.    Avandia [rosiglitazone Maleate] Hives, Other (See Comments)   Geodon [ziprasidone] Other (See Comments)   UNKNOWN   Kiwi Extract Itching, Swelling   Latex Itching      Medication List      allopurinol 100 MG tablet Commonly known as:  ZYLOPRIM Take 100 mg by mouth every evening.   amLODipine 10 MG tablet Commonly known as:  NORVASC Take 1 tablet (10 mg total) by mouth daily.   aspirin 325 MG tablet Take 325 mg by mouth at bedtime.  cetirizine 10 MG tablet Commonly known as:  ZYRTEC Take 10 mg by mouth daily as needed.   cloNIDine 0.1 MG tablet Commonly known as:  CATAPRES Take 0.1 mg by mouth 2 (two) times daily.   colchicine 0.6 MG tablet Take 0.6 mg by mouth daily as needed (For gout flare-up.).   diphenhydrAMINE 25 MG tablet Commonly known as:  BENADRYL Take 1 tablet (25 mg total) by mouth every 6 (six) hours.   EPINEPHrine 0.3 mg/0.3 mL Soaj injection Commonly known as:  EPI-PEN INJECT AS DIRECTED AS NEEDED FOR ALLERGIC REACTION   furosemide 40 MG tablet Commonly known as:  LASIX Take 40 mg by mouth as needed.   gabapentin 600 MG tablet Commonly known as:  NEURONTIN Take 600-1,200 mg by mouth See admin instructions. Take 2 tablets every morning, 1 tablet at noon and 2 tablets every evening   guanFACINE 1 MG tablet Commonly known as:  TENEX Take 1 mg by mouth at bedtime.   guanFACINE 1 MG Tb24 ER tablet Commonly known as:  INTUNIV Take 1 mg by mouth at bedtime.   hydrochlorothiazide 50 MG tablet Commonly known as:  HYDRODIURIL Take 50 mg by mouth  daily.   insulin aspart 100 UNIT/ML injection Commonly known as:  novoLOG Inject 5-25 Units into the skin 3 (three) times daily with meals. Per sliding scale--pt uses Omnipod and gets a basal rate   MACA ROOT PO Take 950 mg by mouth 2 (two) times daily. [employer] - organic Maca Root Black, Red, Yellow 950MG  capsule   naproxen 500 MG tablet Commonly known as:  NAPROSYN Take 1 tablet (500 mg total) by mouth 2 (two) times daily.   OVER THE COUNTER MEDICATION Take 1 tablet by mouth 2 (two) times daily. hylands - bioplasma   spironolactone 25 MG tablet Commonly known as:  ALDACTONE Take 25 mg by mouth every morning.   tiZANidine 2 MG tablet Commonly known as:  ZANAFLEX TAKE 1 TABLET AT BEDTIME DAILY. CAN TAKE 1/2 TABLET IN THE AM AS NEEDED   traMADol 50 MG tablet Commonly known as:  ULTRAM Take 1 tablet (50 mg total) by mouth 3 (three) times daily as needed.   VICTOZA 18 MG/3ML Sopn Generic drug:  liraglutide Inject 0.6 mg into the skin daily.       Review of systems negative except as noted in HPI / PMHx or noted below:  Review of Systems  Constitutional: Negative.   HENT: Negative.   Eyes: Negative.   Respiratory: Negative.   Cardiovascular: Negative.   Gastrointestinal: Negative.   Genitourinary: Negative.   Musculoskeletal: Negative.   Skin: Negative.   Neurological: Negative.   Endo/Heme/Allergies: Negative.   Psychiatric/Behavioral: Negative.     Family History  Problem Relation Age of Onset  . Diabetes Father   . Kidney disease Father   . Allergic rhinitis Mother   . Eczema Mother   . Urticaria Mother   . Diabetes Maternal Grandmother   . Hyperlipidemia Paternal Grandmother   . Stroke Paternal Grandmother   . Eczema Sister   . Urticaria Sister   . Other Neg Hx   . Angioedema Neg Hx   . Asthma Neg Hx     Social History   Socioeconomic History  . Marital status: Married    Spouse name: Not on file  . Number of children: 2  . Years of  education: BA degree  . Highest education level: Not on file  Occupational History    Employer: UNEMPLOYED  Social Needs  . Financial resource strain: Not on file  . Food insecurity:    Worry: Not on file    Inability: Not on file  . Transportation needs:    Medical: Not on file    Non-medical: Not on file  Tobacco Use  . Smoking status: Never Smoker  . Smokeless tobacco: Never Used  Substance and Sexual Activity  . Alcohol use: No    Alcohol/week: 0.0 standard drinks  . Drug use: No  . Sexual activity: Yes    Birth control/protection: None  Lifestyle  . Physical activity:    Days per week: Not on file    Minutes per session: Not on file  . Stress: Not on file  Relationships  . Social connections:    Talks on phone: Not on file    Gets together: Not on file    Attends religious service: Not on file    Active member of club or organization: Not on file    Attends meetings of clubs or organizations: Not on file    Relationship status: Not on file  . Intimate partner violence:    Fear of current or ex partner: Not on file    Emotionally abused: Not on file    Physically abused: Not on file    Forced sexual activity: Not on file  Other Topics Concern  . Not on file  Social History Narrative   Patient is married with 2 children.   Patient is right handed.   Patient has her  BA degree.   Patient drinks 1 cup daily.    Environmental and Social history  Lives in a house with a dry environment, a dog located inside the household, carpet in the bedroom, no plastic on the bed, no plastic on the pillow, no smokers located inside the household.  Objective:   Vitals:   09/08/17 1005  BP: 128/80  Pulse: 70  Resp: 16  Temp: 98.1 F (36.7 C)  SpO2: 98%   Height: 5\' 6"  (167.6 cm) Weight: 244 lb 9.6 oz (110.9 kg)  Physical Exam  HENT:  Head: Normocephalic. Head is without right periorbital erythema and without left periorbital erythema.  Right Ear: Tympanic membrane,  external ear and ear canal normal.  Left Ear: Tympanic membrane, external ear and ear canal normal.  Nose: Nose normal. No mucosal edema or rhinorrhea.  Mouth/Throat: Oropharynx is clear and moist and mucous membranes are normal. No oropharyngeal exudate.  Eyes: Pupils are equal, round, and reactive to light. Conjunctivae and lids are normal.  Neck: Trachea normal. No tracheal deviation present. No thyromegaly present.  Cardiovascular: Normal rate, regular rhythm, S1 normal, S2 normal and normal heart sounds.  No murmur heard. Pulmonary/Chest: Effort normal. No stridor. No respiratory distress. She has no wheezes. She has no rales. She exhibits no tenderness.  Abdominal: Soft. She exhibits no distension and no mass. There is no hepatosplenomegaly. There is no tenderness. There is no rebound and no guarding.  Musculoskeletal: She exhibits no edema or tenderness.  Lymphadenopathy:       Head (right side): No tonsillar adenopathy present.       Head (left side): No tonsillar adenopathy present.    She has no cervical adenopathy.    She has no axillary adenopathy.  Neurological: She is alert.  Skin: Rash (Lichenified hyperpigmented plaques involving wrists bilaterally with slight erythema involving antecubital fossa bilaterally) noted. She is not diaphoretic. No erythema. No pallor. Nails show no clubbing.    Diagnostics:  Allergy skin tests were performed.  She demonstrated hypersensitivity to grasses, trees, weeds, house dust mite, cat, and dog.  She also demonstrated hypersensitivity to almond, hazelnut, sweet potato, navy bean, mushroom, corn, and watermelon.  Results of blood tests obtained 11 May 2017 identified WBC 8.3, absolute eosinophil 200, absolute lymphocyte 1300, hemoglobin 12.3, platelet 281.  Results of blood tests obtained 26 November 2016 identified creatinine 0.80 mg/DL, AST 15 IU/L, ALT 12 IU/L.  Results of a urine pregnancy test obtained 05 July 2017 was  negative.  Results of a chest x-ray obtained 05 July 2017 identified the following:  The cardiac silhouette is upper limits of normal in size. The lungs are well inflated and clear. There is no evidence of pleural effusion or pneumothorax. No acute osseous abnormality is identified.  Assessment and Plan:    1. Angioedema, initial encounter   2. Anaphylactic shock due to food, initial encounter   3. Pollen-food allergy, initial encounter   4. Other atopic dermatitis   5. Perennial allergic rhinitis     1.  Allergen avoidance measures  2.  Treat and prevent immune activation:   A.  Loratadine 10 mg -1 tablet 2 times a day  B.  Ranitidine 150 mg -1 tablet 2 times a day  C.  Montelukast 10 mg -1 tablet 1 time per day  3.  If needed: EpiPen, Benadryl, MD/ER evaluation for allergic reaction  4.  If needed: Mometasone 0.1% ointment 1-2 times per day  5.  Blood - C4, alpha gal panel, TSH, FT4, TP, nut panel with reflex  6.  Medication side effect?  Victoza (GLP-receptor agonists)?   7.  Return to clinic in 4 weeks or earlier if problem  Marieann certainly has a atopic immune system and a fair amount of her hyperreactivity may be tied up with his atopic disease including her cutaneous events recently.  However, we need to exclude other forms of systemic disease contributing to this issue and I have ordered a few blood tests in investigation of systemic disease.  In addition, she is on a GLP-receptor agonist which is known to be associated with episodes of angioedema and anaphylaxis and if she has 1 more episode while utilizing the plan of therapy noted above then I think she needs to discontinue her Victoza.  As well, we will work through her nut allergy with a nut IgE panel.  I will see her back in this clinic in 4 weeks or earlier if there is a problem.  Jiles Prows, MD Allergy / Immunology [facility] [facility]

## 2017-09-09 ENCOUNTER — Encounter: Payer: Self-pay | Admitting: Allergy and Immunology

## 2017-09-12 LAB — ALPHA-GAL PANEL
BEEF (BOS SPP) IGE: 1.09 kU/L — AB (ref <0.35)
Class Interpretation: 1
Class Interpretation: 1
Class Interpretation: 2
Lamb/Mutton (Ovis spp) IgE: 0.64 kU/L — ABNORMAL HIGH (ref <0.35)
PORK (SUS SPP) IGE: 0.56 kU/L — AB (ref <0.35)

## 2017-09-12 LAB — PEANUT COMPONENTS
F352-IgE Ara h 8: <0.1 kU/L
F423-IgE Ara h 2: 0.22 kU/L — AB
F424-IgE Ara h 3: <0.1 kU/L
F427-IgE Ara h 9: 0.31 kU/L — AB
F447-IgE Ara h 6: <0.1 kU/L

## 2017-09-12 LAB — IGE NUT PROF. W/COMPONENT RFLX
Brazil Nut IgE: 0.3 kU/L — AB
F020-IGE ALMOND: 0.8 kU/L — AB
F202-IgE Cashew Nut: 0.94 kU/L — AB
F203-IgE Pistachio Nut: 3.37 kU/L — AB
Hazelnut (Filbert) IgE: 3.1 kU/L — AB
Macadamia Nut, IgE: 2.13 kU/L — AB
Peanut, IgE: 2.97 kU/L — AB
Pecan Nut IgE: 0.38 kU/L — AB
WALNUT IGE: 4.23 kU/L — AB

## 2017-09-12 LAB — T4, FREE: Free T4: 1.07 ng/dL (ref 0.82–1.77)

## 2017-09-12 LAB — PROTEIN, TOTAL: TOTAL PROTEIN: 6.7 g/dL (ref 6.0–8.5)

## 2017-09-12 LAB — PANEL 604726
Cor A 1 IgE: <0.1 kU/L
Cor A 14 IgE: <0.1 kU/L
Cor A 8 IgE: 0.16 kU/L — AB
Cor A 9 IgE: <0.1 kU/L

## 2017-09-12 LAB — PANEL 604350

## 2017-09-12 LAB — C4 COMPLEMENT: Complement C4, Serum: 47 mg/dL — ABNORMAL HIGH (ref 14–44)

## 2017-09-12 LAB — TSH: TSH: 2.02 u[IU]/mL (ref 0.450–4.500)

## 2017-09-12 LAB — PANEL 604239: ANA O 3 IgE: <0.1 kU/L

## 2017-09-12 LAB — PANEL 604721
F442-IGE JUG R 3: 3.89 kU/L — AB
Jug R 1 IgE: <0.1 kU/L

## 2017-09-18 ENCOUNTER — Other Ambulatory Visit: Payer: Self-pay | Admitting: Physical Medicine & Rehabilitation

## 2017-09-28 DIAGNOSIS — E7800 Pure hypercholesterolemia, unspecified: Secondary | ICD-10-CM | POA: Diagnosis not present

## 2017-09-28 DIAGNOSIS — I10 Essential (primary) hypertension: Secondary | ICD-10-CM | POA: Diagnosis not present

## 2017-09-28 DIAGNOSIS — E118 Type 2 diabetes mellitus with unspecified complications: Secondary | ICD-10-CM | POA: Diagnosis not present

## 2017-09-28 DIAGNOSIS — I639 Cerebral infarction, unspecified: Secondary | ICD-10-CM | POA: Diagnosis not present

## 2017-09-28 DIAGNOSIS — E1140 Type 2 diabetes mellitus with diabetic neuropathy, unspecified: Secondary | ICD-10-CM | POA: Diagnosis not present

## 2017-10-09 DIAGNOSIS — E785 Hyperlipidemia, unspecified: Secondary | ICD-10-CM | POA: Diagnosis not present

## 2017-10-09 DIAGNOSIS — M109 Gout, unspecified: Secondary | ICD-10-CM | POA: Diagnosis not present

## 2017-10-09 DIAGNOSIS — I10 Essential (primary) hypertension: Secondary | ICD-10-CM | POA: Diagnosis not present

## 2017-10-09 DIAGNOSIS — E1142 Type 2 diabetes mellitus with diabetic polyneuropathy: Secondary | ICD-10-CM | POA: Diagnosis not present

## 2017-10-13 ENCOUNTER — Encounter: Payer: Self-pay | Admitting: Allergy and Immunology

## 2017-10-13 ENCOUNTER — Ambulatory Visit (INDEPENDENT_AMBULATORY_CARE_PROVIDER_SITE_OTHER): Payer: Federal, State, Local not specified - PPO | Admitting: Allergy and Immunology

## 2017-10-13 VITALS — BP 130/72 | HR 80 | Resp 17

## 2017-10-13 DIAGNOSIS — T781XXD Other adverse food reactions, not elsewhere classified, subsequent encounter: Secondary | ICD-10-CM | POA: Diagnosis not present

## 2017-10-13 DIAGNOSIS — T7800XD Anaphylactic reaction due to unspecified food, subsequent encounter: Secondary | ICD-10-CM

## 2017-10-13 DIAGNOSIS — T783XXD Angioneurotic edema, subsequent encounter: Secondary | ICD-10-CM

## 2017-10-13 DIAGNOSIS — L2089 Other atopic dermatitis: Secondary | ICD-10-CM

## 2017-10-13 DIAGNOSIS — J3089 Other allergic rhinitis: Secondary | ICD-10-CM

## 2017-10-13 NOTE — Patient Instructions (Addendum)
  1.  Allergen avoidance measures - mammal, tree nuts  2.  Treat and prevent immune activation:   A.  Loratadine 10 mg -1 tablet 2 times a day  B.  Ranitidine 150 mg -1 tablet 2 times a day  C.  Montelukast 10 mg -1 tablet 1 time per day  3.  If needed: EpiPen, Benadryl, MD/ER evaluation for allergic reaction  4.  If needed: Mometasone 0.1% ointment 1-2 times per day  5.  Medication side effect?  Victoza (GLP-receptor agonists)?   6.  Return to clinic in 12 weeks or earlier if problem  7. Obtain fall flu vaccine

## 2017-10-13 NOTE — Progress Notes (Signed)
Follow-up Note  Referring Provider: Jani Gravel, MD Primary Provider: Jani Gravel, MD Date of Office Visit: 10/13/2017  Subjective:   Tina [name] (DOB: 05-19-1970) is a 47 y.o. female who returns to the [facility] on 10/13/2017 in re-evaluation of the following:  HPI: Tina [name] returns to this clinic in reevaluation of recurrent episodes of angioedema and food allergy and atopic dermatitis and allergic rhinitis.  I last saw her in his clinic during her initial evaluation 08 September 2017.  She has not had any allergic reactions.  She is careful about avoiding mammal and tree nuts.  She does eat peanuts with no problem.  She continues on a collection of loratadine and ranitidine and montelukast.  Her atopic dermatitis on her wrist is improving significantly with the topical therapy although somewhat slowly.  Occasionally her skin gets a little bit itchy and she will take a Benadryl usually averaging out to 2-3 times a week.  She has been fatigued.  She is not really sure exactly why she is fatigued but she thinks it may be a blood pressure medicine and she is working through her primary care doctor to have her blood pressure medicines changed.  She has not been having any issues with her nose at this point.  Allergies as of 10/13/2017      Reactions   Contrast Media [iodinated Diagnostic Agents] Other (See Comments)   Difficulty breathing   Iohexol Hives, Nausea And Vomiting, Swelling    Desc: Magnevist-gadolinium-difficulty breathing, throat swelling   Midazolam Hcl Anaphylaxis   Difficulty breathing   Shellfish Allergy Anaphylaxis   Valsartan Swelling   Metformin And Related Diarrhea, Nausea And Vomiting   Other Itching   Patient is allergic to all nuts except peanuts.    Avandia [rosiglitazone Maleate] Hives, Other (See Comments)   Geodon [ziprasidone] Other (See Comments)   UNKNOWN   Kiwi Extract Itching, Swelling   Latex Itching      Medication List      allopurinol 100 MG tablet Commonly known as:  ZYLOPRIM Take 100 mg by mouth every evening.   amLODipine 10 MG tablet Commonly known as:  NORVASC Take 1 tablet (10 mg total) by mouth daily.   aspirin 325 MG tablet Take 325 mg by mouth at bedtime.   cetirizine 10 MG tablet Commonly known as:  ZYRTEC Take 10 mg by mouth daily as needed.   cloNIDine 0.1 MG tablet Commonly known as:  CATAPRES Take 0.1 mg by mouth 2 (two) times daily.   colchicine 0.6 MG tablet Take 0.6 mg by mouth daily as needed (For gout flare-up.).   diphenhydrAMINE 25 MG tablet Commonly known as:  BENADRYL Take 1 tablet (25 mg total) by mouth every 6 (six) hours.   EPINEPHrine 0.3 mg/0.3 mL Soaj injection Commonly known as:  EPI-PEN INJECT AS DIRECTED AS NEEDED FOR ALLERGIC REACTION   furosemide 40 MG tablet Commonly known as:  LASIX Take 40 mg by mouth as needed.   gabapentin 600 MG tablet Commonly known as:  NEURONTIN Take 600-1,200 mg by mouth See admin instructions. Take 2 tablets every morning, 1 tablet at noon and 2 tablets every evening   guanFACINE 1 MG tablet Commonly known as:  TENEX Take 1 mg by mouth at bedtime.   guanFACINE 1 MG Tb24 ER tablet Commonly known as:  INTUNIV Take 1 mg by mouth at bedtime.   hydrochlorothiazide 50 MG tablet Commonly known as:  HYDRODIURIL Take 50 mg by mouth  daily.   insulin aspart 100 UNIT/ML injection Commonly known as:  novoLOG Inject 5-25 Units into the skin 3 (three) times daily with meals. Per sliding scale--pt uses Omnipod and gets a basal rate   MACA ROOT PO Take 950 mg by mouth 2 (two) times daily. [employer] - organic Maca Root Black, Red, Yellow 950MG  capsule   mometasone 0.1 % cream Commonly known as:  ELOCON 1-2 times per day   montelukast 10 MG tablet Commonly known as:  SINGULAIR Take 1 tablet (10 mg total) by mouth at bedtime.   naproxen 500 MG tablet Commonly known as:  NAPROSYN Take 1 tablet (500 mg total) by mouth  2 (two) times daily.   OVER THE COUNTER MEDICATION Take 1 tablet by mouth 2 (two) times daily. [facility] - bioplasma   spironolactone 25 MG tablet Commonly known as:  ALDACTONE Take 25 mg by mouth every morning.   tiZANidine 2 MG tablet Commonly known as:  ZANAFLEX TAKE 1 TABLET AT BEDTIME DAILY. CAN TAKE 1/2 TABLET IN THE AM AS NEEDED   traMADol 50 MG tablet Commonly known as:  ULTRAM TAKE 1 TABLET BY MOUTH TWICE A DAY.   VICTOZA 18 MG/3ML Sopn Generic drug:  liraglutide Inject 0.6 mg into the skin daily.       Past Medical History:  Diagnosis Date  . Anemia    DURING MENSES--HAS HEAVY BLEEDING WITH PERODS  . Anxiety   . Back pain, chronic    "ongoing"  . Blood transfusion    IN 2012  AFTER C -SECTION  . Cerebral thrombosis with cerebral infarction [facility]) JUNE 2011   RIGHT SIDED WEAKNESS ( ARM AND LEG ) AND SPASMS-remains with slight weakness and vertigo.  . Depression   . Diabetes mellitus   . Diabetic neuropathy ([place])    BOTH FEET --COMES AND GOES  . Endometriosis   . H/O eye surgery   . Headache(784.0)    MIGRAINES--NOT REALLY HEADACHE-MORE LIKE PRESSURE SENSATION IN HEAD-FEELS DIZZIY AND  FAINT AS THE PRESSURE RESOLVES  . Hernia, incisional, RLQ, s/p lap repair Sep 2013 09/02/2011  . Hx of migraines 10/19/2011  . Hypertension   . Leg pain, right    "like bad Charley horse"  . Panic disorder without agoraphobia   . Rash    HANDS, ARMS --STATES HX OF RASH EVER SINCE CHILDBIRTH/PREGNANCY.  STATES THE RASH OFTEN OCCURS WHEN SHE IS REALLY STRESSED."goes and comes-presently left ring finger"  . Restless leg syndrome    DIAGNOSED BY SLEEP STUDY - PT TOLD SHE DID NOT HAVE SLEEP APNEA  . Right rotator cuff tear    PAIN IN RIGHT SHOULDER  . SBO (small bowel obstruction) ([place]) 06/03/2012  . Spastic hemiplegia affecting dominant side ([place])   . Stroke ([place])   . Ventral hernia    RIGHT LOWER QUADRANT-CAUSING SOME PAIN    Past Surgical History:  Procedure Laterality  Date  . APPLICATION OF WOUND VAC N/A 05/25/2015   Procedure: APPLICATION OF WOUND VAC;  Surgeon: Michael Boston, MD;  Location: [facility];  Service: General;  Laterality: N/A;  . CESAREAN SECTION  2012  . DIAGNOSTIC LAPAROSCOPY    . ESOPHAGOGASTRODUODENOSCOPY N/A 06/03/2012   Procedure: ESOPHAGOGASTRODUODENOSCOPY (EGD);  Surgeon: [name] Craver, MD;  Location: [facility] ENDOSCOPY;  Service: Endoscopy;  Laterality: N/A;  . EXCISION MASS ABDOMINAL N/A 05/25/2015   Procedure: ABDOMINAL WALL EXPLORATION EXCISION OF SEROMA REMOVAL OF REDUNDANT SKIN ;  Surgeon: Michael Boston, MD;  Location: [facility];  Service: General;  Laterality: N/A;  . EYE SURGERY     Eye laser for vessel hemorrhaging  . HERNIA REPAIR  10/03/11   ventral hernia repair  . INSERTION OF MESH N/A 02/04/2013   Procedure: INSERTION OF MESH;  Surgeon: Adin Hector, MD;  Location: [facility];  Service: General;  Laterality: N/A;  . UMBILICAL HERNIA REPAIR N/A 02/04/2013   Procedure: LAPAROSCOPIC ventral wall hernia repair LAPAROSCOPIC LYSIS OF ADHESIONS laparoscopic exploration of abdomen ;  Surgeon: Adin Hector, MD;  Location: [facility];  Service: General;  Laterality: N/A;  . URETER REVISION     Bilateral "twisted"  . UTERINE FIBROID SURGERY     2 SURGERIES FOR FIBROIDS  . VENTRAL HERNIA REPAIR  10/03/2011   Procedure: LAPAROSCOPIC VENTRAL HERNIA;  Surgeon: Adin Hector, MD;  Location: [facility];  Service: General;  Laterality: N/A;    Review of systems negative except as noted in HPI / PMHx or noted below:  Review of Systems  Constitutional: Negative.   HENT: Negative.   Eyes: Negative.   Respiratory: Negative.   Cardiovascular: Negative.   Gastrointestinal: Negative.   Genitourinary: Negative.   Musculoskeletal: Negative.   Skin: Negative.   Neurological: Negative.   Endo/Heme/Allergies: Negative.   Psychiatric/Behavioral: Negative.      Objective:   Vitals:   10/13/17 1113  BP: 130/72  Pulse: 80  Resp: 17  SpO2: 98%           Physical Exam  HENT:  Head: Normocephalic.  Right Ear: Tympanic membrane, external ear and ear canal normal.  Left Ear: Tympanic membrane, external ear and ear canal normal.  Nose: Nose normal. No mucosal edema or rhinorrhea.  Mouth/Throat: Uvula is midline, oropharynx is clear and moist and mucous membranes are normal. No oropharyngeal exudate.  Eyes: Conjunctivae are normal.  Neck: Trachea normal. No tracheal tenderness present. No tracheal deviation present. No thyromegaly present.  Cardiovascular: Normal rate, regular rhythm, S1 normal, S2 normal and normal heart sounds.  No murmur heard. Pulmonary/Chest: Breath sounds normal. No stridor. No respiratory distress. She has no wheezes. She has no rales.  Musculoskeletal: She exhibits no edema.  Lymphadenopathy:       Head (right side): No tonsillar adenopathy present.       Head (left side): No tonsillar adenopathy present.    She has no cervical adenopathy.  Neurological: She is alert.  Skin: Rash (Lichenified hyperpigmented plaque right wrist.  No erythema.) noted. She is not diaphoretic. No erythema. Nails show no clubbing.    Diagnostics: none  Results of blood tests obtained 08 September 2017 identified IgE directed against peanut 2.97 KU/L, Ara H1 less than 0.1U KU/L, Ara H2 0.22 KU/L, Ara H3 less than 0.10 KU/L, Ara H 6 less than 0.10 KU/L, Ara H90.31 KU/L, hazelnut 3.10 KU/L, [place] not 0.30 KU/L, walnut 4.23 KU/L, almond 0.80 KU/L, pecan 0.38 KU/L, cashew 0.94 KU/L, pistachio 3.37 KU/L, macadamia 2.13 KU/L, beef 1.09 KU/L, Lamb 0.64 KU/L, pork 0.56 KU/L alpha gal less than 0.10 KU/L, TSH 2.02 IU/mL, T4 1.07 NG/DL, C4 47 mg/DL  Assessment and Plan:   1. Angioedema, subsequent encounter   2. Anaphylactic shock due to food, subsequent encounter   3. Pollen-food allergy, subsequent encounter   4. Other atopic dermatitis   5. Perennial allergic rhinitis     1.  Allergen avoidance measures - mammal, tree nuts  2.  Treat and  prevent immune activation:   A.  Loratadine 10 mg -1 tablet 2 times a day  B.  Ranitidine 150 mg -1 tablet 2 times a day  C.  Montelukast 10 mg -1 tablet 1 time per day  3.  If needed: EpiPen, Benadryl, MD/ER evaluation for allergic reaction  4.  If needed: Mometasone 0.1% ointment 1-2 times per day  5.  Medication side effect?  Victoza (GLP-receptor agonists)?   6.  Return to clinic in 12 weeks or earlier if problem  7. Obtain fall flu vaccine  Tina [name] appears to be doing better at this point in time.  Obviously she is allergic to mammal and tree nuts and should not consume these foods.  If she has an additional angioedematous episode in the future then she probably needs to discontinue her GLP receptor agonist as this is the most likely cause of that reaction.  We will keep her on a combination of therapy directed against immunological hyperreactivity and topical inflammation as noted above until we see her back in this clinic in 12 weeks and then at that point in time there will probably be an opportunity to consolidate her treatment.  Allena Katz, MD [facility] [place]

## 2017-10-14 ENCOUNTER — Encounter: Payer: Self-pay | Admitting: Allergy and Immunology

## 2017-10-19 DIAGNOSIS — F331 Major depressive disorder, recurrent, moderate: Secondary | ICD-10-CM | POA: Diagnosis not present

## 2017-10-19 DIAGNOSIS — F4011 Social phobia, generalized: Secondary | ICD-10-CM | POA: Diagnosis not present

## 2017-10-26 DIAGNOSIS — E118 Type 2 diabetes mellitus with unspecified complications: Secondary | ICD-10-CM | POA: Diagnosis not present

## 2017-10-26 DIAGNOSIS — I639 Cerebral infarction, unspecified: Secondary | ICD-10-CM | POA: Diagnosis not present

## 2017-10-26 DIAGNOSIS — I10 Essential (primary) hypertension: Secondary | ICD-10-CM | POA: Diagnosis not present

## 2017-10-26 DIAGNOSIS — E1140 Type 2 diabetes mellitus with diabetic neuropathy, unspecified: Secondary | ICD-10-CM | POA: Diagnosis not present

## 2017-10-26 DIAGNOSIS — E7800 Pure hypercholesterolemia, unspecified: Secondary | ICD-10-CM | POA: Diagnosis not present

## 2017-10-27 ENCOUNTER — Encounter (INDEPENDENT_AMBULATORY_CARE_PROVIDER_SITE_OTHER): Payer: Federal, State, Local not specified - PPO

## 2017-11-03 ENCOUNTER — Ambulatory Visit (INDEPENDENT_AMBULATORY_CARE_PROVIDER_SITE_OTHER): Payer: Federal, State, Local not specified - PPO | Admitting: Bariatrics

## 2017-11-03 ENCOUNTER — Encounter (INDEPENDENT_AMBULATORY_CARE_PROVIDER_SITE_OTHER): Payer: Self-pay | Admitting: Bariatrics

## 2017-11-03 ENCOUNTER — Other Ambulatory Visit (INDEPENDENT_AMBULATORY_CARE_PROVIDER_SITE_OTHER): Payer: Self-pay | Admitting: Bariatrics

## 2017-11-03 VITALS — BP 145/84 | HR 78 | Temp 97.7°F | Ht 67.0 in | Wt 238.0 lb

## 2017-11-03 DIAGNOSIS — R0602 Shortness of breath: Secondary | ICD-10-CM

## 2017-11-03 DIAGNOSIS — Z6837 Body mass index (BMI) 37.0-37.9, adult: Secondary | ICD-10-CM

## 2017-11-03 DIAGNOSIS — Z794 Long term (current) use of insulin: Secondary | ICD-10-CM

## 2017-11-03 DIAGNOSIS — E559 Vitamin D deficiency, unspecified: Secondary | ICD-10-CM

## 2017-11-03 DIAGNOSIS — Z9189 Other specified personal risk factors, not elsewhere classified: Secondary | ICD-10-CM | POA: Diagnosis not present

## 2017-11-03 DIAGNOSIS — R5383 Other fatigue: Secondary | ICD-10-CM | POA: Diagnosis not present

## 2017-11-03 DIAGNOSIS — F3289 Other specified depressive episodes: Secondary | ICD-10-CM | POA: Diagnosis not present

## 2017-11-03 DIAGNOSIS — E119 Type 2 diabetes mellitus without complications: Secondary | ICD-10-CM | POA: Diagnosis not present

## 2017-11-03 DIAGNOSIS — I10 Essential (primary) hypertension: Secondary | ICD-10-CM

## 2017-11-03 DIAGNOSIS — Z0289 Encounter for other administrative examinations: Secondary | ICD-10-CM

## 2017-11-04 LAB — COMPREHENSIVE METABOLIC PANEL
A/G RATIO: 1.3 (ref 1.2–2.2)
ALBUMIN: 3.8 g/dL (ref 3.5–5.5)
ALK PHOS: 54 IU/L (ref 39–117)
ALT: 13 IU/L (ref 0–32)
AST: 14 IU/L (ref 0–40)
BILIRUBIN TOTAL: 0.3 mg/dL (ref 0.0–1.2)
BUN / CREAT RATIO: 16 (ref 9–23)
BUN: 11 mg/dL (ref 6–24)
CO2: 24 mmol/L (ref 20–29)
CREATININE: 0.7 mg/dL (ref 0.57–1.00)
Calcium: 9.1 mg/dL (ref 8.7–10.2)
Chloride: 100 mmol/L (ref 96–106)
GFR calc Af Amer: 118 mL/min/{1.73_m2} (ref >59)
GFR calc non Af Amer: 103 mL/min/{1.73_m2} (ref >59)
GLOBULIN, TOTAL: 3 g/dL (ref 1.5–4.5)
Glucose: 128 mg/dL — ABNORMAL HIGH (ref 65–99)
POTASSIUM: 3.7 mmol/L (ref 3.5–5.2)
SODIUM: 141 mmol/L (ref 134–144)
Total Protein: 6.8 g/dL (ref 6.0–8.5)

## 2017-11-04 LAB — LIPID PANEL WITH LDL/HDL RATIO
Cholesterol, Total: 230 mg/dL — ABNORMAL HIGH (ref 100–199)
HDL: 50 mg/dL (ref >39)
LDL Calculated: 138 mg/dL — ABNORMAL HIGH (ref 0–99)
LDL/HDL RATIO: 2.8 ratio (ref 0.0–3.2)
Triglycerides: 211 mg/dL — ABNORMAL HIGH (ref 0–149)
VLDL Cholesterol Cal: 42 mg/dL — ABNORMAL HIGH (ref 5–40)

## 2017-11-04 LAB — VITAMIN B12: VITAMIN B 12: 361 pg/mL (ref 232–1245)

## 2017-11-04 LAB — HEMOGLOBIN A1C
ESTIMATED AVERAGE GLUCOSE: 194 mg/dL
HEMOGLOBIN A1C: 8.4 % — AB (ref 4.8–5.6)

## 2017-11-04 LAB — FOLATE: FOLATE: 12.6 ng/mL (ref >3.0)

## 2017-11-04 LAB — T3: T3, Total: 108 ng/dL (ref 71–180)

## 2017-11-04 LAB — T4, FREE: Free T4: 1.17 ng/dL (ref 0.82–1.77)

## 2017-11-04 LAB — VITAMIN D 25 HYDROXY (VIT D DEFICIENCY, FRACTURES): VIT D 25 HYDROXY: 10.3 ng/mL — AB (ref 30.0–100.0)

## 2017-11-04 LAB — TSH: TSH: 0.947 u[IU]/mL (ref 0.450–4.500)

## 2017-11-05 NOTE — Progress Notes (Signed)
Office: [phone]  /  Fax: [phone]   Dear Dr. Garwin Brothers,   Thank you for referring Tina [name] to our clinic. The following note includes my evaluation and treatment recommendations.  HPI:   Chief Complaint: OBESITY    Audrena CALINA PATRIE has been referred by Sheronette A. Garwin Brothers, MD for consultation regarding her obesity and obesity related comorbidities.    Jacalyn ALLANA SHRESTHA (MR# [id number]) is a 47 y.o. female who presents on 11/05/2017 for obesity evaluation and treatment. Current BMI is Body mass index is 37.28 kg/m.Marland Kitchen Kenzi has been struggling with her weight for many years and has been unsuccessful in either losing weight, maintaining weight loss, or reaching her healthy weight goal. Dyrest has multiple food allergies.     Valari attended our information session and states she is currently in the action stage of change and ready to dedicate time achieving and maintaining a healthier weight. Shary is interested in becoming our patient and working on intensive lifestyle modifications including (but not limited to) diet, exercise and weight loss.    Milanna states her family eats meals together she thinks her family will eat healthier with  her her desired weight loss is 70 lbs she has been heavy most of  her life she started gaining weight after she had her son her heaviest weight ever was 240 lbs. she is a picky eater and doesn't like to eat healthier foods  she has significant food cravings for bread and pasta  she snacks frequently in the evenings she skips breakfast frequently she is frequently drinking liquids with calories she frequently eats larger portions than normal  she has binge eating behaviors she struggles with emotional eating    Fatigue Hayley feels her energy is lower than it should be. This has worsened with weight gain and has not worsened recently. Delrose admits to daytime somnolence and admits to waking up still tired. Patient is at  risk for obstructive sleep apnea. Patent has a history of symptoms of daytime fatigue, morning fatigue, morning headache and hypertension. Patient generally gets 4 or 5 hours of sleep per night, and states they generally have restless sleep. Snoring is present. Apneic episodes are not present. Epworth Sleepiness Score is 7  Dyspnea on exertion Marguarite notes increasing shortness of breath with exercising and seems to be worsening over time with weight gain. She notes getting out of breath sooner with activity than she used to. This has not gotten worse recently. Cherese denies chest pain or orthopnea.  Hypertension Alesha OLUWATONI ROTUNNO is a 47 y.o. female with hypertension. She is on amlodipine and propanolol. Serin CASSARA NIDA denies lightheadedness. She is working weight loss to help control her blood pressure with the goal of decreasing her risk of heart attack and stroke. Dyriests blood pressure is not well controlled.  Diabetes II Taylar has a diagnosis of diabetes type II. Mirah states fasting BGs range between 117 and 223 and 1 episode of dropping below 50 and 2 hour post prandial range between 109 and 141. Jumana is on Victoza, Novolog and basal insulin. She has been working on intensive lifestyle modifications including diet, exercise, and weight loss to help control her blood glucose levels.  Vitamin D deficiency Bethanne has a diagnosis of vitamin D deficiency. She is not taking vit D at this time. Trenace denies nausea, vomiting or muscle weakness.  Depression with emotional eating behaviors Anne is struggling with emotional eating and using food for comfort to the extent that it is negatively  impacting her health. She often snacks when she is not hungry. Michaelle sometimes feels she is out of control and then feels guilty that she made poor food choices. Nimrit sees a therapist for other issues; anxiety and panic attacks. She is currently on medications. She  Is attempting to work on  behavior modification techniques to help reduce her emotional eating. She shows no sign of suicidal or homicidal ideations.  Depression screen [facility] 2/9 11/03/2017 01/23/2017 12/12/2016 03/08/2015  Decreased Interest 2 0 0 2  Down, Depressed, Hopeless 3 0 0 2  PHQ - 2 Score 5 0 0 4  Altered sleeping 2 - - 3  Tired, decreased energy 3 - - 3  Change in appetite 3 - - 1  Feeling bad or failure about yourself  3 - - 2  Trouble concentrating 1 - - 3  Moving slowly or fidgety/restless 0 - - 2  Suicidal thoughts 1 - - 1  PHQ-9 Score 18 - - 19  Difficult doing work/chores Somewhat difficult - - Somewhat difficult      Depression Screen Sarin's Food and Mood (modified PHQ-9) score was  Depression screen PHQ 2/9 11/03/2017  Decreased Interest 2  Down, Depressed, Hopeless 3  PHQ - 2 Score 5  Altered sleeping 2  Tired, decreased energy 3  Change in appetite 3  Feeling bad or failure about yourself  3  Trouble concentrating 1  Moving slowly or fidgety/restless 0  Suicidal thoughts 1  PHQ-9 Score 18  Difficult doing work/chores Somewhat difficult    ALLERGIES: Allergies  Allergen Reactions  . Contrast Media [Iodinated Diagnostic Agents] Other (See Comments)    Difficulty breathing  . Iohexol Hives, Nausea And Vomiting and Swelling     Desc: Magnevist-gadolinium-difficulty breathing, throat swelling   . Midazolam Hcl Anaphylaxis    Difficulty breathing  . Shellfish Allergy Anaphylaxis  . Valsartan Swelling  . Metformin And Related Diarrhea and Nausea And Vomiting  . Other Itching    Patient is allergic to all nuts except peanuts.   . Avandia [Rosiglitazone Maleate] Hives and Other (See Comments)  . Geodon [Ziprasidone] Other (See Comments)    UNKNOWN  . Kiwi Extract Itching and Swelling  . Latex Itching    MEDICATIONS: Current Outpatient Medications on File Prior to Visit  Medication Sig Dispense Refill  . allopurinol (ZYLOPRIM) 100 MG tablet Take 100 mg by mouth every  evening.    Marland Kitchen amLODipine (NORVASC) 10 MG tablet Take 1 tablet (10 mg total) by mouth daily. 30 tablet 0  . aspirin 325 MG tablet Take 325 mg by mouth at bedtime.     . cetirizine (ZYRTEC) 10 MG tablet Take 10 mg by mouth daily as needed.  11  . cloNIDine (CATAPRES) 0.1 MG tablet Take 0.1 mg by mouth 2 (two) times daily.  0  . colchicine 0.6 MG tablet Take 0.6 mg by mouth daily as needed (For gout flare-up.).   1  . diphenhydrAMINE (BENADRYL) 25 MG tablet Take 1 tablet (25 mg total) by mouth every 6 (six) hours. 12 tablet 0  . EPINEPHrine 0.3 mg/0.3 mL IJ SOAJ injection INJECT AS DIRECTED AS NEEDED FOR ALLERGIC REACTION 1 Device 2  . furosemide (LASIX) 40 MG tablet Take 40 mg by mouth as needed.    . gabapentin (NEURONTIN) 600 MG tablet Take 600-1,200 mg by mouth See admin instructions. Take 2 tablets every morning, 1 tablet at noon and 2 tablets every evening    . guanFACINE (INTUNIV) 1 MG  TB24 ER tablet Take 1 mg by mouth at bedtime.     Marland Kitchen guanFACINE (TENEX) 1 MG tablet Take 1 mg by mouth at bedtime.    . hydrochlorothiazide (HYDRODIURIL) 50 MG tablet Take 50 mg by mouth daily.     . insulin aspart (NOVOLOG) 100 UNIT/ML injection Inject 5-25 Units into the skin 3 (three) times daily with meals. Per sliding scale--pt uses Omnipod and gets a basal rate    . Insulin Glargine (BASAGLAR KWIKPEN) 100 UNIT/ML SOPN Inject into the skin daily.    . Liraglutide (VICTOZA) 18 MG/3ML SOPN Inject 0.6 mg into the skin daily.     Marland Kitchen MACA ROOT PO Take 950 mg by mouth 2 (two) times daily. [employer] - organic Maca Root Black, Red, Yellow 950MG  capsule    . mometasone (ELOCON) 0.1 % cream 1-2 times per day 45 g 1  . montelukast (SINGULAIR) 10 MG tablet Take 1 tablet (10 mg total) by mouth at bedtime. 30 tablet 5  . naproxen (NAPROSYN) 500 MG tablet Take 1 tablet (500 mg total) by mouth 2 (two) times daily. 14 tablet 0  . OVER THE COUNTER MEDICATION Take 1 tablet by mouth 2 (two) times daily. hylands -  bioplasma    . propranolol (INDERAL) 20 MG tablet Take 20 mg by mouth 3 (three) times daily.    Marland Kitchen spironolactone (ALDACTONE) 25 MG tablet Take 25 mg by mouth every morning.  1  . tiZANidine (ZANAFLEX) 2 MG tablet TAKE 1 TABLET AT BEDTIME DAILY. CAN TAKE 1/2 TABLET IN THE AM AS NEEDED 135 tablet 1  . traMADol (ULTRAM) 50 MG tablet TAKE 1 TABLET BY MOUTH TWICE A DAY. 90 tablet 5   No current facility-administered medications on file prior to visit.     PAST MEDICAL HISTORY: Past Medical History:  Diagnosis Date  . Anemia    DURING MENSES--HAS HEAVY BLEEDING WITH PERODS  . Anxiety   . Back pain, chronic    "ongoing"  . Blood transfusion    IN 2012  AFTER C -SECTION  . Cerebral thrombosis with cerebral infarction [facility]) JUNE 2011   RIGHT SIDED WEAKNESS ( ARM AND LEG ) AND SPASMS-remains with slight weakness and vertigo.  . Constipation   . Depression   . Diabetes mellitus   . Diabetic neuropathy ([place])    BOTH FEET --COMES AND GOES  . Edema, lower extremity   . Endometriosis   . Fatty liver   . H/O eye surgery   . Headache(784.0)    MIGRAINES--NOT REALLY HEADACHE-MORE LIKE PRESSURE SENSATION IN HEAD-FEELS DIZZIY AND  FAINT AS THE PRESSURE RESOLVES  . Hernia, incisional, RLQ, s/p lap repair Sep 2013 09/02/2011  . Hx of migraines 10/19/2011  . Hypertension   . Leg pain, right    "like bad Charley horse"  . Multiple food allergies   . Panic disorder without agoraphobia   . Rash    HANDS, ARMS --STATES HX OF RASH EVER SINCE CHILDBIRTH/PREGNANCY.  STATES THE RASH OFTEN OCCURS WHEN SHE IS REALLY STRESSED."goes and comes-presently left ring finger"  . Restless leg syndrome    DIAGNOSED BY SLEEP STUDY - PT TOLD SHE DID NOT HAVE SLEEP APNEA  . Right rotator cuff tear    PAIN IN RIGHT SHOULDER  . SBO (small bowel obstruction) ([place]) 06/03/2012  . Shortness of breath   . Spastic hemiplegia affecting dominant side ([place])   . Stomach problems   . Stroke ([place])   . Ventral hernia  RIGHT  LOWER QUADRANT-CAUSING SOME PAIN  . Weakness of right side of body     PAST SURGICAL HISTORY: Past Surgical History:  Procedure Laterality Date  . APPLICATION OF WOUND VAC N/A 05/25/2015   Procedure: APPLICATION OF WOUND VAC;  Surgeon: Michael Boston, MD;  Location: [facility];  Service: General;  Laterality: N/A;  . CESAREAN SECTION  2012  . DIAGNOSTIC LAPAROSCOPY    . ESOPHAGOGASTRODUODENOSCOPY N/A 06/03/2012   Procedure: ESOPHAGOGASTRODUODENOSCOPY (EGD);  Surgeon: [name] Craver, MD;  Location: [facility] ENDOSCOPY;  Service: Endoscopy;  Laterality: N/A;  . EXCISION MASS ABDOMINAL N/A 05/25/2015   Procedure: ABDOMINAL WALL EXPLORATION EXCISION OF SEROMA REMOVAL OF REDUNDANT SKIN ;  Surgeon: Michael Boston, MD;  Location: [facility];  Service: General;  Laterality: N/A;  . EYE SURGERY     Eye laser for vessel hemorrhaging  . fybroid removal    . HERNIA REPAIR  10/03/11   ventral hernia repair  . INSERTION OF MESH N/A 02/04/2013   Procedure: INSERTION OF MESH;  Surgeon: Adin Hector, MD;  Location: [facility];  Service: General;  Laterality: N/A;  . UMBILICAL HERNIA REPAIR N/A 02/04/2013   Procedure: LAPAROSCOPIC ventral wall hernia repair LAPAROSCOPIC LYSIS OF ADHESIONS laparoscopic exploration of abdomen ;  Surgeon: Adin Hector, MD;  Location: [facility];  Service: General;  Laterality: N/A;  . URETER REVISION     Bilateral "twisted"  . UTERINE FIBROID SURGERY     2 SURGERIES FOR FIBROIDS  . VENTRAL HERNIA REPAIR  10/03/2011   Procedure: LAPAROSCOPIC VENTRAL HERNIA;  Surgeon: Adin Hector, MD;  Location: [facility];  Service: General;  Laterality: N/A;    SOCIAL HISTORY: Social History   Tobacco Use  . Smoking status: Never Smoker  . Smokeless tobacco: Never Used  Substance Use Topics  . Alcohol use: No    Alcohol/week: 0.0 standard drinks  . Drug use: No    FAMILY HISTORY: Family History  Problem Relation Age of Onset  . Diabetes Father   . Kidney disease Father   . Depression Father   . Drug  abuse Father   . Allergic rhinitis Mother   . Eczema Mother   . Urticaria Mother   . Depression Mother   . Anxiety disorder Mother   . Bipolar disorder Mother   . Alcoholism Mother   . Drug abuse Mother   . Eating disorder Mother   . Diabetes Maternal Grandmother   . Hyperlipidemia Paternal Grandmother   . Stroke Paternal Grandmother   . Eczema Sister   . Urticaria Sister   . Colon cancer Paternal Uncle   . Other Neg Hx   . Angioedema Neg Hx   . Asthma Neg Hx     ROS: Review of Systems  Constitutional: Positive for malaise/fatigue.       + Breasts Pain  HENT: Positive for tinnitus.        + Dry Mouth  Eyes:       + Vision Changes + Floaters + Flashes of Light  Respiratory: Positive for shortness of breath.   Cardiovascular: Negative for chest pain and orthopnea.       + Shortness of Breath with Activity + Leg Cramping + Very Cold Feet or Hands  Gastrointestinal: Positive for constipation, diarrhea and nausea. Negative for vomiting.  Genitourinary: Positive for frequency.  Musculoskeletal: Positive for back pain.       + Muscle Stiffness Negative for muscle weakness  Skin: Positive for itching and rash.       +  Hair or Nail Changes  Neurological: Positive for dizziness, weakness and headaches.       Negative for lightheadedness  Endo/Heme/Allergies:       Positive for hypoglycemia Positive for hyperglycemia  Psychiatric/Behavioral: Positive for depression. Negative for suicidal ideas. The patient has insomnia.        + Stress    PHYSICAL EXAM: Blood pressure (!) 145/84, pulse 78, temperature 97.7 F (36.5 C), temperature source Oral, height 5\' 7"  (1.702 m), weight 238 lb (108 kg), last menstrual period 10/19/2017, SpO2 98 %. Body mass index is 37.28 kg/m. Physical Exam  Constitutional: She is oriented to person, place, and time. She appears well-developed and well-nourished.  HENT:  Head: Normocephalic and atraumatic.  Nose: Nose normal.  Eyes: EOM are  normal. No scleral icterus.  Neck: Normal range of motion. Neck supple. No thyromegaly present.  Cardiovascular: Normal rate and regular rhythm.  Pulmonary/Chest: Effort normal. No respiratory distress.  Abdominal: Soft. There is no tenderness.  + Obesity  Musculoskeletal: Normal range of motion.  Range of Motion normal in all 4 extremities  Neurological: She is alert and oriented to person, place, and time. Coordination normal.  Skin: Skin is warm and dry.  Psychiatric: She has a normal mood and affect. She expresses no homicidal and no suicidal ideation.  Vitals reviewed.   RECENT LABS AND TESTS: BMET    Component Value Date/Time   NA 141 11/03/2017 1310   K 3.7 11/03/2017 1310   CL 100 11/03/2017 1310   CO2 24 11/03/2017 1310   GLUCOSE 128 (H) 11/03/2017 1310   GLUCOSE 141 (H) 07/05/2017 1437   BUN 11 11/03/2017 1310   CREATININE 0.70 11/03/2017 1310   CALCIUM 9.1 11/03/2017 1310   GFRNONAA 103 11/03/2017 1310   GFRAA 118 11/03/2017 1310   Lab Results  Component Value Date   HGBA1C 8.4 (H) 11/03/2017   No results found for: INSULIN CBC    Component Value Date/Time   WBC 6.1 07/05/2017 1437   RBC 4.69 07/05/2017 1437   HGB 12.1 07/05/2017 1437   HCT 38.4 07/05/2017 1437   PLT 317 07/05/2017 1437   MCV 81.9 07/05/2017 1437   MCH 25.8 (L) 07/05/2017 1437   MCHC 31.5 07/05/2017 1437   RDW 13.9 07/05/2017 1437   LYMPHSABS 1.3 05/11/2017 1323   MONOABS 0.6 05/11/2017 1323   EOSABS 0.2 05/11/2017 1323   BASOSABS 0.0 05/11/2017 1323   Iron/TIBC/Ferritin/ %Sat    Component Value Date/Time   IRON 114 11/01/2015 1031   TIBC 372 11/01/2015 1031   FERRITIN 35 11/01/2015 1031   IRONPCTSAT 31 11/01/2015 1031   Lipid Panel     Component Value Date/Time   CHOL 230 (H) 11/03/2017 1310   TRIG 211 (H) 11/03/2017 1310   HDL 50 11/03/2017 1310   CHOLHDL 6.0 10/17/2011 0315   VLDL 59 (H) 10/17/2011 0315   LDLCALC 138 (H) 11/03/2017 1310   Hepatic Function Panel       Component Value Date/Time   PROT 6.8 11/03/2017 1310   ALBUMIN 3.8 11/03/2017 1310   AST 14 11/03/2017 1310   ALT 13 11/03/2017 1310   ALKPHOS 54 11/03/2017 1310   BILITOT 0.3 11/03/2017 1310   BILIDIR <0.1 06/03/2012 0220   IBILI NOT CALCULATED 06/03/2012 0220      Component Value Date/Time   TSH 0.947 11/03/2017 1310   TSH 2.020 09/08/2017 1129   TSH 1.045 06/03/2012 0520    ECG  shows NSR with a rate of  77 BPM INDIRECT CALORIMETER done today shows a VO2 of 313 and a REE of 2129.  Her calculated basal metabolic rate is 8413 thus her basal metabolic rate is better than expected.    ASSESSMENT AND PLAN: Other fatigue - Plan: EKG 12-Lead, Vitamin B12, Folate, T3, T4, free, TSH  Shortness of breath on exertion - Plan: Lipid Panel With LDL/HDL Ratio  Essential hypertension  Type 2 diabetes mellitus without complication, with long-term current use of insulin (HCC) - Plan: Comprehensive metabolic panel, Hemoglobin A1c  Vitamin D deficiency - Plan: VITAMIN D 25 Hydroxy (Vit-D Deficiency, Fractures)  Other depression - with emotional eating  At risk for heart disease  Class 2 severe obesity with serious comorbidity and body mass index (BMI) of 37.0 to 37.9 in adult, unspecified obesity type (HCC)  PLAN: Fatigue Audelia was informed that her fatigue may be related to obesity, depression or many other causes. Labs will be ordered, and in the meanwhile Jazline has agreed to work on diet, exercise and weight loss to help with fatigue. Proper sleep hygiene was discussed including the need for 7-8 hours of quality sleep each night. A sleep study was not ordered based on symptoms and Epworth score.  Dyspnea on exertion Hilliary's shortness of breath appears to be obesity related and exercise induced. She has agreed to work on weight loss and slowly increase exercise (cardio and resistance) to treat her exercise induced shortness of breath. If Diamone follows our instructions and loses  weight without improvement of her shortness of breath, we will plan to refer to pulmonology. We will monitor this condition regularly. Shayne agrees to this plan.  Hypertension We discussed sodium restriction, working on healthy weight loss, and a regular exercise program as the means to achieve improved blood pressure control. Laporchia agreed with this plan and agreed to follow up as directed. We will continue to monitor her blood pressure as well as her progress with the above lifestyle modifications. She will continue her medications as prescribed and will watch for signs of hypotension as she continues her lifestyle modifications. Jaria will follow up with her PCP (multiple allergies) and will check on new medication.  Diabetes II Ceirra has been given extensive diabetes education by myself today including ideal fasting and post-prandial blood glucose readings, individual ideal Hgb A1c goals and hypoglycemia prevention. We discussed the importance of good blood sugar control to decrease the likelihood of diabetic complications such as nephropathy, neuropathy, limb loss, blindness, coronary artery disease, and death. We discussed the importance of intensive lifestyle modification including diet, exercise and weight loss as the first line treatment for diabetes. We will check Hgb A1c and Ingrid will continue basal insulin 12 units and Victoza, regular insulin 5 units with each meal and follow up at the agreed upon time. Anjolaoluwa will call us if her blood sugar drops below 80, to make adjustments in her regular insulin.  Vitamin D Deficiency Kioni was informed that low vitamin D levels contributes to fatigue and are associated with obesity, breast, and colon cancer. We will check vitamin D level today and she will follow up for routine testing of vitamin D, at least 2-3 times per year.   Depression with Emotional Eating Behaviors We discussed behavior modification techniques today to help Melvenia  deal with her emotional eating and depression. We will refer to Dr. [name] our bariatric psychologist.  Depression Screen Martia had a strongly positive depression screening. Depression is commonly associated with obesity and often results in emotional eating  behaviors. We will monitor this closely and work on CBT to help improve the non-hunger eating patterns. We will refer to Dr. [name] our bariatric psychologist.  Obesity Aleaya is currently in the action stage of change and her goal is to continue with weight loss efforts. I recommend Eisa begin the structured treatment plan as follows:  She has agreed to follow the Category 3 plan Ernisha has been instructed to eventually work up to a goal of 150 minutes of combined cardio and strengthening exercise per week for weight loss and overall health benefits. We discussed the following Behavioral Modification Strategies today: increase H2O intake, no skipping meals, better snacking choices, increasing lean protein intake, decreasing simple carbohydrates  and increasing vegetables   She was informed of the importance of frequent follow up visits to maximize her success with intensive lifestyle modifications for her multiple health conditions. She was informed we would discuss her lab results at her next visit unless there is a critical issue that needs to be addressed sooner. Rossetta agreed to keep her next visit at the agreed upon time to discuss these results.    OBESITY BEHAVIORAL INTERVENTION VISIT  Today's visit was # 1   Starting weight: 238 lbs Starting date: 11/03/17 Today's weight : 238 lbs  Today's date: 11/03/2017 Total lbs lost to date: 0   ASK: We discussed the diagnosis of obesity with Iniya Carmelia Roller today and Jaculin agreed to give [name] permission to discuss obesity behavioral modification therapy today.  ASSESS: Io has the diagnosis of obesity and her BMI today is 37.2 Heli is in the action stage of change    ADVISE: Aireonna was educated on the multiple health risks of obesity as well as the benefit of weight loss to improve her health. She was advised of the need for long term treatment and the importance of lifestyle modifications to improve her current health and to decrease her risk of future health problems.  AGREE: Multiple dietary modification options and treatment options were discussed and  Albirda agreed to follow the recommendations documented in the above note.  ARRANGE: Mylinda was educated on the importance of frequent visits to treat obesity as outlined per CMS and USPSTF guidelines and agreed to schedule her next follow up appointment today.  Corey Skains, am acting as Location manager for [employer]. Owens Shark, DO  I have reviewed the above documentation for accuracy and completeness, and I agree with the above. -Jearld Lesch, DO

## 2017-11-16 DIAGNOSIS — F331 Major depressive disorder, recurrent, moderate: Secondary | ICD-10-CM | POA: Diagnosis not present

## 2017-11-16 DIAGNOSIS — F4011 Social phobia, generalized: Secondary | ICD-10-CM | POA: Diagnosis not present

## 2017-11-17 ENCOUNTER — Ambulatory Visit (INDEPENDENT_AMBULATORY_CARE_PROVIDER_SITE_OTHER): Payer: Federal, State, Local not specified - PPO | Admitting: Bariatrics

## 2017-11-17 ENCOUNTER — Encounter (INDEPENDENT_AMBULATORY_CARE_PROVIDER_SITE_OTHER): Payer: Self-pay | Admitting: Bariatrics

## 2017-11-17 VITALS — BP 113/71 | HR 76 | Temp 97.9°F | Ht 67.0 in | Wt 233.0 lb

## 2017-11-17 DIAGNOSIS — E118 Type 2 diabetes mellitus with unspecified complications: Secondary | ICD-10-CM | POA: Diagnosis not present

## 2017-11-17 DIAGNOSIS — E1121 Type 2 diabetes mellitus with diabetic nephropathy: Secondary | ICD-10-CM

## 2017-11-17 DIAGNOSIS — I639 Cerebral infarction, unspecified: Secondary | ICD-10-CM | POA: Diagnosis not present

## 2017-11-17 DIAGNOSIS — E559 Vitamin D deficiency, unspecified: Secondary | ICD-10-CM

## 2017-11-17 DIAGNOSIS — Z111 Encounter for screening for respiratory tuberculosis: Secondary | ICD-10-CM | POA: Diagnosis not present

## 2017-11-17 DIAGNOSIS — Z9189 Other specified personal risk factors, not elsewhere classified: Secondary | ICD-10-CM | POA: Diagnosis not present

## 2017-11-17 DIAGNOSIS — E1140 Type 2 diabetes mellitus with diabetic neuropathy, unspecified: Secondary | ICD-10-CM | POA: Diagnosis not present

## 2017-11-17 DIAGNOSIS — Z6836 Body mass index (BMI) 36.0-36.9, adult: Secondary | ICD-10-CM | POA: Diagnosis not present

## 2017-11-17 DIAGNOSIS — E7849 Other hyperlipidemia: Secondary | ICD-10-CM

## 2017-11-17 DIAGNOSIS — E7800 Pure hypercholesterolemia, unspecified: Secondary | ICD-10-CM | POA: Diagnosis not present

## 2017-11-17 DIAGNOSIS — I10 Essential (primary) hypertension: Secondary | ICD-10-CM | POA: Diagnosis not present

## 2017-11-17 DIAGNOSIS — Z794 Long term (current) use of insulin: Secondary | ICD-10-CM

## 2017-11-17 MED ORDER — VITAMIN D (ERGOCALCIFEROL) 1.25 MG (50000 UNIT) PO CAPS: 50000.0000 [IU] | ORAL_CAPSULE | ORAL | 0 refills | Status: DC

## 2017-11-18 DIAGNOSIS — Z6839 Body mass index (BMI) 39.0-39.9, adult: Secondary | ICD-10-CM | POA: Insufficient documentation

## 2017-11-18 NOTE — Progress Notes (Signed)
Office: [phone]  /  Fax: [phone]   HPI:   Chief Complaint: OBESITY Tina [name] is here to discuss her progress with her obesity treatment plan. She is following the Category 3 plan and is following her eating plan approximately 95 % of the time. She states she is walking 15 minutes 3 times per week. Tina [name] states that she did not struggle with the plan. She said that she had minimal hunger and only some mild cravings.   Her weight is 233 lb (105.7 kg) today and has had a weight loss of 5 pounds over a period of 2 weeks since her last visit. She has lost 5 lbs since starting treatment with [name].  Diabetes II with diabetic neuropathy Tina [name] has a diagnosis of diabetes type II. She is currently on insulin, Victoza and Basaglar. At her last visit we reduced her to 5 units on her regular insulin with each meal. She has had excellent blood sugars readings and denies hypoglycemia. Long acting was decreased from 22 units to 18 units. Last A1c was Hemoglobin A1C Latest Ref Rng & Units 11/03/2017  HGBA1C 4.8 - 5.6 % 8.4(H)  Some recent data might be hidden    She has been working on intensive lifestyle modifications including diet, exercise, and weight loss to help control her blood glucose levels.  Vitamin D deficiency [name] has a diagnosis of vitamin D deficiency. She is not currently taking vit D and denies nausea, vomiting or muscle weakness.  At risk for osteopenia and osteoporosis Tina [name] is at higher risk of osteopenia and osteoporosis due to vitamin D deficiency.   Hyperlipidemia Tina [name] has hyperlipidemia and has been trying to improve her cholesterol levels with intensive lifestyle modification including a low saturated fat diet, exercise and weight loss. She denies any chest pain, claudication or myalgias. She has not taken any cholesterol meds at this time.    ALLERGIES: Allergies  Allergen Reactions  . Contrast Media [Iodinated Diagnostic Agents] Other (See Comments)   Difficulty breathing  . Iohexol Hives, Nausea And Vomiting and Swelling     Desc: Magnevist-gadolinium-difficulty breathing, throat swelling   . Midazolam Hcl Anaphylaxis    Difficulty breathing  . Shellfish Allergy Anaphylaxis  . Valsartan Swelling  . Metformin And Related Diarrhea and Nausea And Vomiting  . Other Itching    Patient is allergic to all nuts except peanuts.   . Avandia [Rosiglitazone Maleate] Hives and Other (See Comments)  . Geodon [Ziprasidone] Other (See Comments)    UNKNOWN  . Kiwi Extract Itching and Swelling  . Latex Itching    MEDICATIONS: Current Outpatient Medications on File Prior to Visit  Medication Sig Dispense Refill  . allopurinol (ZYLOPRIM) 100 MG tablet Take 100 mg by mouth every evening.    Tina [name] amLODipine (NORVASC) 10 MG tablet Take 1 tablet (10 mg total) by mouth daily. 30 tablet 0  . aspirin 325 MG tablet Take 325 mg by mouth at bedtime.     . cetirizine (ZYRTEC) 10 MG tablet Take 10 mg by mouth daily as needed.  11  . cloNIDine (CATAPRES) 0.1 MG tablet Take 0.1 mg by mouth 2 (two) times daily.  0  . colchicine 0.6 MG tablet Take 0.6 mg by mouth daily as needed (For gout flare-up.).   1  . diphenhydrAMINE (BENADRYL) 25 MG tablet Take 1 tablet (25 mg total) by mouth every 6 (six) hours. 12 tablet 0  . EPINEPHrine 0.3 mg/0.3 mL IJ SOAJ injection INJECT AS DIRECTED AS NEEDED FOR ALLERGIC  REACTION 1 Device 2  . furosemide (LASIX) 40 MG tablet Take 40 mg by mouth as needed.    . gabapentin (NEURONTIN) 600 MG tablet Take 600-1,200 mg by mouth See admin instructions. Take 2 tablets every morning, 1 tablet at noon and 2 tablets every evening    . guanFACINE (INTUNIV) 1 MG TB24 ER tablet Take 1 mg by mouth at bedtime.     Tina [name] guanFACINE (TENEX) 1 MG tablet Take 1 mg by mouth at bedtime.    . hydrochlorothiazide (HYDRODIURIL) 50 MG tablet Take 50 mg by mouth daily.     . insulin aspart (NOVOLOG) 100 UNIT/ML injection Inject 5-25 Units into the skin 3 (three)  times daily with meals. Per sliding scale--pt uses Omnipod and gets a basal rate    . Insulin Glargine (BASAGLAR KWIKPEN) 100 UNIT/ML SOPN Inject into the skin daily.    . Liraglutide (VICTOZA) 18 MG/3ML SOPN Inject 0.6 mg into the skin daily.     Tina [name] MACA ROOT PO Take 950 mg by mouth 2 (two) times daily. [employer] - organic Maca Root Black, Red, Yellow 950MG  capsule    . mometasone (ELOCON) 0.1 % cream 1-2 times per day 45 g 1  . montelukast (SINGULAIR) 10 MG tablet Take 1 tablet (10 mg total) by mouth at bedtime. 30 tablet 5  . naproxen (NAPROSYN) 500 MG tablet Take 1 tablet (500 mg total) by mouth 2 (two) times daily. 14 tablet 0  . OVER THE COUNTER MEDICATION Take 1 tablet by mouth 2 (two) times daily. [facility]ylands - bioplasma    . propranolol (INDERAL) 20 MG tablet Take 20 mg by mouth 3 (three) times daily.    Tina [name] spironolactone (ALDACTONE) 25 MG tablet Take 25 mg by mouth every morning.  1  . tiZANidine (ZANAFLEX) 2 MG tablet TAKE 1 TABLET AT BEDTIME DAILY. CAN TAKE 1/2 TABLET IN THE AM AS NEEDED 135 tablet 1  . traMADol (ULTRAM) 50 MG tablet TAKE 1 TABLET BY MOUTH TWICE A DAY. 90 tablet 5   No current facility-administered medications on file prior to visit.     PAST MEDICAL HISTORY: Past Medical History:  Diagnosis Date  . Anemia    DURING MENSES--HAS HEAVY BLEEDING WITH PERODS  . Anxiety   . Back pain, chronic    "ongoing"  . Blood transfusion    IN 2012  AFTER C -SECTION  . Cerebral thrombosis with cerebral infarction [facility]) JUNE 2011   RIGHT SIDED WEAKNESS ( ARM AND LEG ) AND SPASMS-remains with slight weakness and vertigo.  . Constipation   . Depression   . Diabetes mellitus   . Diabetic neuropathy ([place])    BOTH FEET --COMES AND GOES  . Edema, lower extremity   . Endometriosis   . Fatty liver   . H/O eye surgery   . Headache(784.0)    MIGRAINES--NOT REALLY HEADACHE-MORE LIKE PRESSURE SENSATION IN HEAD-FEELS DIZZIY AND  FAINT AS THE PRESSURE RESOLVES  . Hernia,  incisional, RLQ, s/p lap repair Sep 2013 09/02/2011  . Hx of migraines 10/19/2011  . Hypertension   . Leg pain, right    "like bad Charley horse"  . Multiple food allergies   . Panic disorder without agoraphobia   . Rash    HANDS, ARMS --STATES HX OF RASH EVER SINCE CHILDBIRTH/PREGNANCY.  STATES THE RASH OFTEN OCCURS WHEN SHE IS REALLY STRESSED."goes and comes-presently left ring finger"  . Restless leg syndrome    DIAGNOSED BY SLEEP STUDY - PT TOLD SHE  DID NOT HAVE SLEEP APNEA  . Right rotator cuff tear    PAIN IN RIGHT SHOULDER  . SBO (small bowel obstruction) ([place]) 06/03/2012  . Shortness of breath   . Spastic hemiplegia affecting dominant side ([place])   . Stomach problems   . Stroke ([place])   . Ventral hernia    RIGHT LOWER QUADRANT-CAUSING SOME PAIN  . Weakness of right side of body     PAST SURGICAL HISTORY: Past Surgical History:  Procedure Laterality Date  . APPLICATION OF WOUND VAC N/A 05/25/2015   Procedure: APPLICATION OF WOUND VAC;  Surgeon: Michael Boston, MD;  Location: [facility];  Service: General;  Laterality: N/A;  . CESAREAN SECTION  2012  . DIAGNOSTIC LAPAROSCOPY    . ESOPHAGOGASTRODUODENOSCOPY N/A 06/03/2012   Procedure: ESOPHAGOGASTRODUODENOSCOPY (EGD);  Surgeon: [name] Craver, MD;  Location: [facility] ENDOSCOPY;  Service: Endoscopy;  Laterality: N/A;  . EXCISION MASS ABDOMINAL N/A 05/25/2015   Procedure: ABDOMINAL WALL EXPLORATION EXCISION OF SEROMA REMOVAL OF REDUNDANT SKIN ;  Surgeon: Michael Boston, MD;  Location: [facility];  Service: General;  Laterality: N/A;  . EYE SURGERY     Eye laser for vessel hemorrhaging  . fybroid removal    . HERNIA REPAIR  10/03/11   ventral hernia repair  . INSERTION OF MESH N/A 02/04/2013   Procedure: INSERTION OF MESH;  Surgeon: Adin Hector, MD;  Location: [facility];  Service: General;  Laterality: N/A;  . UMBILICAL HERNIA REPAIR N/A 02/04/2013   Procedure: LAPAROSCOPIC ventral wall hernia repair LAPAROSCOPIC LYSIS OF ADHESIONS laparoscopic  exploration of abdomen ;  Surgeon: Adin Hector, MD;  Location: [facility];  Service: General;  Laterality: N/A;  . URETER REVISION     Bilateral "twisted"  . UTERINE FIBROID SURGERY     2 SURGERIES FOR FIBROIDS  . VENTRAL HERNIA REPAIR  10/03/2011   Procedure: LAPAROSCOPIC VENTRAL HERNIA;  Surgeon: Adin Hector, MD;  Location: [facility];  Service: General;  Laterality: N/A;    SOCIAL HISTORY: Social History   Tobacco Use  . Smoking status: Never Smoker  . Smokeless tobacco: Never Used  Substance Use Topics  . Alcohol use: No    Alcohol/week: 0.0 standard drinks  . Drug use: No    FAMILY HISTORY: Family History  Problem Relation Age of Onset  . Diabetes Father   . Kidney disease Father   . Depression Father   . Drug abuse Father   . Allergic rhinitis Mother   . Eczema Mother   . Urticaria Mother   . Depression Mother   . Anxiety disorder Mother   . Bipolar disorder Mother   . Alcoholism Mother   . Drug abuse Mother   . Eating disorder Mother   . Diabetes Maternal Grandmother   . Hyperlipidemia Paternal Grandmother   . Stroke Paternal Grandmother   . Eczema Sister   . Urticaria Sister   . Colon cancer Paternal Uncle   . Other Neg Hx   . Angioedema Neg Hx   . Asthma Neg Hx     ROS: Review of Systems  Constitutional: Positive for malaise/fatigue and weight loss.  Cardiovascular: Negative for chest pain and claudication.  Gastrointestinal: Negative for nausea and vomiting.  Musculoskeletal: Negative for myalgias.       Negative for muscle weakness  Endo/Heme/Allergies:       Negative for hypoglycemia    PHYSICAL EXAM: Blood pressure 113/71, pulse 76, temperature 97.9 F (36.6 C), temperature source Oral, height 5\' 7"  (1.702  m), weight 233 lb (105.7 kg), last menstrual period 10/19/2017, SpO2 99 %. Body mass index is 36.49 kg/m. Physical Exam  Constitutional: She is oriented to person, place, and time. She appears well-developed and well-nourished.    Cardiovascular: Normal rate.  Pulmonary/Chest: Effort normal.  Musculoskeletal: Normal range of motion.  Neurological: She is alert and oriented to person, place, and time.  Skin: Skin is warm and dry.  Psychiatric: She has a normal mood and affect. Her behavior is normal.  Vitals reviewed.   RECENT LABS AND TESTS: BMET    Component Value Date/Time   NA 141 11/03/2017 1310   K 3.7 11/03/2017 1310   CL 100 11/03/2017 1310   CO2 24 11/03/2017 1310   GLUCOSE 128 (H) 11/03/2017 1310   GLUCOSE 141 (H) 07/05/2017 1437   BUN 11 11/03/2017 1310   CREATININE 0.70 11/03/2017 1310   CALCIUM 9.1 11/03/2017 1310   GFRNONAA 103 11/03/2017 1310   GFRAA 118 11/03/2017 1310   Lab Results  Component Value Date   HGBA1C 8.4 (H) 11/03/2017   HGBA1C 7.5 (H) 05/18/2015   HGBA1C 8.7 (H) 02/05/2013   HGBA1C 6.9 (H) 06/03/2012   HGBA1C 8.3 (H) 10/17/2011   No results found for: INSULIN CBC    Component Value Date/Time   WBC 6.1 07/05/2017 1437   RBC 4.69 07/05/2017 1437   HGB 12.1 07/05/2017 1437   HCT 38.4 07/05/2017 1437   PLT 317 07/05/2017 1437   MCV 81.9 07/05/2017 1437   MCH 25.8 (L) 07/05/2017 1437   MCHC 31.5 07/05/2017 1437   RDW 13.9 07/05/2017 1437   LYMPHSABS 1.3 05/11/2017 1323   MONOABS 0.6 05/11/2017 1323   EOSABS 0.2 05/11/2017 1323   BASOSABS 0.0 05/11/2017 1323   Iron/TIBC/Ferritin/ %Sat    Component Value Date/Time   IRON 114 11/01/2015 1031   TIBC 372 11/01/2015 1031   FERRITIN 35 11/01/2015 1031   IRONPCTSAT 31 11/01/2015 1031   Lipid Panel     Component Value Date/Time   CHOL 230 (H) 11/03/2017 1310   TRIG 211 (H) 11/03/2017 1310   HDL 50 11/03/2017 1310   CHOLHDL 6.0 10/17/2011 0315   VLDL 59 (H) 10/17/2011 0315   LDLCALC 138 (H) 11/03/2017 1310   Hepatic Function Panel     Component Value Date/Time   PROT 6.8 11/03/2017 1310   ALBUMIN 3.8 11/03/2017 1310   AST 14 11/03/2017 1310   ALT 13 11/03/2017 1310   ALKPHOS 54 11/03/2017 1310    BILITOT 0.3 11/03/2017 1310   BILIDIR <0.1 06/03/2012 0220   IBILI NOT CALCULATED 06/03/2012 0220      Component Value Date/Time   TSH 0.947 11/03/2017 1310   TSH 2.020 09/08/2017 1129   TSH 1.045 06/03/2012 0520   Results for CHRISTABELLE, HANZLIK (MRN [id number]) as of 11/18/2017 07:53  Ref. Range 11/03/2017 13:10  Vitamin D, 25-Hydroxy Latest Ref Range: 30.0 - 100.0 ng/mL 10.3 (L)   ASSESSMENT AND PLAN: Type 2 diabetes mellitus with diabetic nephropathy, with long-term current use of insulin (HCC)  Other hyperlipidemia  Vitamin D deficiency - Plan: Vitamin D, Ergocalciferol, (DRISDOL) 50000 units CAPS capsule  At risk for osteoporosis  Class 2 severe obesity with serious comorbidity and body mass index (BMI) of 36.0 to 36.9 in adult, unspecified obesity type ([place])  PLAN: Diabetes II Kamaya has been given extensive diabetes education by myself today including ideal fasting and post-prandial blood glucose readings, individual ideal HgA1c goals  and hypoglycemia prevention. We discussed the  importance of good blood sugar control to decrease the likelihood of diabetic complications such as nephropathy, neuropathy, limb loss, blindness, coronary artery disease, and death. We discussed the importance of intensive lifestyle modification including diet, exercise and weight loss as the first line treatment for diabetes. Mauria agrees to continue her diabetes medications and her CGM. She will continue to see her endocrinologist for him to monitor her progress. Roxene agrees to follow up with our office in 2 weeks.   Vitamin D Deficiency Judeth was informed that low vitamin D levels contributes to fatigue and are associated with obesity, breast, and colon cancer. She agrees to start taking prescription Vit D @50 ,000 IU every week #4 with no refills. Kaelah will follow up for routine testing of vitamin D, at least 2-3 times per year. She was informed of the risk of over-replacement of vitamin D  and agrees to not increase her dose unless she discusses this with [name] first. Latori agrees to follow up with our office in 2 weeks.   At risk for osteopenia and osteoporosis Amarea was given extended  (15 minutes) osteoporosis prevention counseling today. Shritha is at risk for osteopenia and osteoporsis due to her vitamin D deficiency. She was encouraged to take her vitamin D and follow her higher calcium diet and increase strengthening exercise to help strengthen her bones and decrease her risk of osteopenia and osteoporosis.  Hyperlipidemia Jihan was informed of the American Heart Association Guidelines emphasizing intensive lifestyle modifications as the first line treatment for hyperlipidemia. We discussed many lifestyle modifications today in depth, and Ameshia will continue to work on decreasing saturated fats such as fatty red meat, butter and many fried foods. She will also increase vegetables and lean protein in her diet and continue to work on exercise and weight loss efforts. Elayjah was advised of the advantages of taking statins and that she should be on a statin but she declines at this time. Glyn agrees to follow up with our office in 2 weeks.   Obesity Shaye is currently in the action stage of change. As such, her goal is to continue with weight loss efforts She has agreed to follow the Category 3 plan Ameera has been instructed to work up to a goal of 150 minutes of combined cardio and strengthening exercise per week for weight loss and overall health benefits. We discussed the following Behavioral Modification Stratagies today: increasing lean protein intake, increase H2O intake, no skipping meals, better snacking choices, planning for success, decreasing simple carbohydrates , increasing vegetables, decreasing sodium intake, decrease eating out and work on meal planning and easy cooking plans  Adelyna has agreed to follow up with our clinic in 2 weeks. She was informed  of the importance of frequent follow up visits to maximize her success with intensive lifestyle modifications for her multiple health conditions.   OBESITY BEHAVIORAL INTERVENTION VISIT  Today's visit was # 2   Starting weight: 238 lbs Starting date: 11/03/2017 Today's weight : Weight: 233 lb (105.7 kg)  Today's date: 11/17/2017 Total lbs lost to date: 5 lbs At least 15 minutes were spent on discussing the following behavioral intervention visit.   ASK: We discussed the diagnosis of obesity with Normagene Carmelia Roller today and Ilsa agreed to give [name] permission to discuss obesity behavioral modification therapy today.  ASSESS: Minerva has the diagnosis of obesity and her BMI today is 36.48 Jazma is in the action stage of change   ADVISE: Aldea was educated on the multiple health risks  of obesity as well as the benefit of weight loss to improve her health. She was advised of the need for long term treatment and the importance of lifestyle modifications to improve her current health and to decrease her risk of future health problems.  AGREE: Multiple dietary modification options and treatment options were discussed and  Mattye agreed to follow the recommendations documented in the above note.  ARRANGE: Fatoumata was educated on the importance of frequent visits to treat obesity as outlined per CMS and USPSTF guidelines and agreed to schedule her next follow up appointment today.  I, Remi Deter, CMA, am acting as Location manager for [employer], DO.   I have reviewed the above documentation for accuracy and completeness, and I agree with the above. -Jearld Lesch, DO

## 2017-11-19 DIAGNOSIS — E1142 Type 2 diabetes mellitus with diabetic polyneuropathy: Secondary | ICD-10-CM | POA: Diagnosis not present

## 2017-11-19 DIAGNOSIS — Z8673 Personal history of transient ischemic attack (TIA), and cerebral infarction without residual deficits: Secondary | ICD-10-CM | POA: Diagnosis not present

## 2017-11-19 DIAGNOSIS — I10 Essential (primary) hypertension: Secondary | ICD-10-CM | POA: Diagnosis not present

## 2017-11-19 DIAGNOSIS — E118 Type 2 diabetes mellitus with unspecified complications: Secondary | ICD-10-CM | POA: Diagnosis not present

## 2017-11-19 DIAGNOSIS — Z23 Encounter for immunization: Secondary | ICD-10-CM | POA: Diagnosis not present

## 2017-11-30 DIAGNOSIS — Z794 Long term (current) use of insulin: Secondary | ICD-10-CM | POA: Diagnosis not present

## 2017-11-30 DIAGNOSIS — R0789 Other chest pain: Secondary | ICD-10-CM | POA: Diagnosis not present

## 2017-11-30 DIAGNOSIS — I10 Essential (primary) hypertension: Secondary | ICD-10-CM | POA: Diagnosis not present

## 2017-11-30 DIAGNOSIS — E1165 Type 2 diabetes mellitus with hyperglycemia: Secondary | ICD-10-CM | POA: Diagnosis not present

## 2017-12-01 ENCOUNTER — Ambulatory Visit (INDEPENDENT_AMBULATORY_CARE_PROVIDER_SITE_OTHER): Payer: Federal, State, Local not specified - PPO | Admitting: Bariatrics

## 2017-12-01 VITALS — BP 142/83 | HR 69 | Temp 97.3°F | Ht 67.0 in | Wt 228.0 lb

## 2017-12-01 DIAGNOSIS — Z794 Long term (current) use of insulin: Secondary | ICD-10-CM

## 2017-12-01 DIAGNOSIS — E1121 Type 2 diabetes mellitus with diabetic nephropathy: Secondary | ICD-10-CM | POA: Diagnosis not present

## 2017-12-01 DIAGNOSIS — I10 Essential (primary) hypertension: Secondary | ICD-10-CM | POA: Diagnosis not present

## 2017-12-01 DIAGNOSIS — Z6835 Body mass index (BMI) 35.0-35.9, adult: Secondary | ICD-10-CM

## 2017-12-01 DIAGNOSIS — E559 Vitamin D deficiency, unspecified: Secondary | ICD-10-CM

## 2017-12-03 DIAGNOSIS — E118 Type 2 diabetes mellitus with unspecified complications: Secondary | ICD-10-CM | POA: Diagnosis not present

## 2017-12-03 DIAGNOSIS — I10 Essential (primary) hypertension: Secondary | ICD-10-CM | POA: Diagnosis not present

## 2017-12-03 DIAGNOSIS — Z0000 Encounter for general adult medical examination without abnormal findings: Secondary | ICD-10-CM | POA: Diagnosis not present

## 2017-12-03 DIAGNOSIS — E1140 Type 2 diabetes mellitus with diabetic neuropathy, unspecified: Secondary | ICD-10-CM | POA: Diagnosis not present

## 2017-12-07 NOTE — Progress Notes (Signed)
Office: [phone]  /  Fax: [phone]   HPI:   Chief Complaint: OBESITY Tina [name] is here to discuss her progress with her obesity treatment plan. She is on the Category 3 plan and is following her eating plan approximately 70 % of the time. She states she is walking 15 minutes 1 time per week. [place] states that she has been struggling with the Category 3 plan. She thinks that she is "going through menopause". Her weight is 228 lb (103.4 kg) today and has had a weight loss of 5 pounds over a period of 2 weeks since her last visit. She has lost 10 lbs since starting treatment with [name].  Diabetes II with diabetic neuropathy, uncontrolled Tina [name] has a diagnosis of diabetes type II. She is on insulin, victoza and basaglar. Avonne states fasting BGs range between 120 and 150's and her lowest reading was at 68 (1 time). Her average blood sugar is 131. Last A1c was at 8.4 She has been working on intensive lifestyle modifications including diet, exercise, and weight loss to help control her blood glucose levels.  Vitamin D deficiency Tina [name] has a diagnosis of vitamin D deficiency. She is currently taking high dose prescription vit D and denies nausea, vomiting or muscle weakness.  Hypertension Tina [name] NIASHA DEVINS is a 47 y.o. female with hypertension. Alaze is currently taking propranolol, aldactone, norvasc, catapres and HCTZ and she is seeing her cardiologist. Tina [name] denies chest pain or headache. She is working weight loss to help control her blood pressure with the goal of decreasing her risk of heart attack and stroke. Dyriests blood pressure is reasonably controlled.  ALLERGIES: Allergies  Allergen Reactions  . Contrast Media [Iodinated Diagnostic Agents] Other (See Comments)    Difficulty breathing  . Tina [name] Hives, Nausea And Vomiting and Swelling     Desc: Tina [name]-gadolinium-difficulty breathing, throat swelling   . Tina [name] Anaphylaxis    Difficulty  breathing  . Tina [name] Anaphylaxis  . Tina [name] Swelling  . Tina [name] And Related Diarrhea and Nausea And Vomiting  . Other Itching    Patient is allergic to all nuts except peanuts.   . Tina [name] [Rosiglitazone Maleate] Hives and Other (See Comments)  . Tina [name] [Ziprasidone] Other (See Comments)    UNKNOWN  . Tina [name] Itching and Swelling  . Tina [name] Itching    MEDICATIONS: Current Outpatient Medications on File Prior to Visit  Medication Sig Dispense Refill  . allopurinol (ZYLOPRIM) 100 MG tablet Take 100 mg by mouth every evening.    Marland Kitchen amLODipine (NORVASC) 10 MG tablet Take 1 tablet (10 mg total) by mouth daily. 30 tablet 0  . aspirin 325 MG tablet Take 325 mg by mouth at bedtime.     . cetirizine (ZYRTEC) 10 MG tablet Take 10 mg by mouth daily as needed.  11  . cloNIDine (CATAPRES) 0.1 MG tablet Take 0.1 mg by mouth 2 (two) times daily.  0  . colchicine 0.6 MG tablet Take 0.6 mg by mouth daily as needed (For gout flare-up.).   1  . diphenhydrAMINE (BENADRYL) 25 MG tablet Take 1 tablet (25 mg total) by mouth every 6 (six) hours. 12 tablet 0  . EPINEPHrine 0.3 mg/0.3 mL IJ SOAJ injection INJECT AS DIRECTED AS NEEDED FOR ALLERGIC REACTION 1 Device 2  . furosemide (LASIX) 40 MG tablet Take 40 mg by mouth as needed.    . gabapentin (NEURONTIN) 600 MG tablet Take 600-1,200 mg by mouth See admin instructions. Take 2 tablets every morning, 1  tablet at noon and 2 tablets every evening    . guanFACINE (INTUNIV) 1 MG TB24 ER tablet Take 1 mg by mouth at bedtime.     Marland Kitchen guanFACINE (TENEX) 1 MG tablet Take 1 mg by mouth at bedtime.    . hydrochlorothiazide (HYDRODIURIL) 50 MG tablet Take 50 mg by mouth daily.     . insulin aspart (NOVOLOG) 100 UNIT/ML injection Inject 5-25 Units into the skin 3 (three) times daily with meals. Per sliding scale--pt uses Omnipod and gets a basal rate    . Insulin Glargine (BASAGLAR KWIKPEN) 100 UNIT/ML SOPN Inject into the skin daily.    . Liraglutide  (VICTOZA) 18 MG/3ML SOPN Inject 0.6 mg into the skin daily.     Marland Kitchen MACA ROOT PO Take 950 mg by mouth 2 (two) times daily. [employer] - organic Maca Root Black, Red, Yellow 950MG  capsule    . mometasone (ELOCON) 0.1 % cream 1-2 times per day 45 g 1  . montelukast (SINGULAIR) 10 MG tablet Take 1 tablet (10 mg total) by mouth at bedtime. 30 tablet 5  . naproxen (NAPROSYN) 500 MG tablet Take 1 tablet (500 mg total) by mouth 2 (two) times daily. 14 tablet 0  . OVER THE COUNTER MEDICATION Take 1 tablet by mouth 2 (two) times daily. hylands - bioplasma    . propranolol (INDERAL) 20 MG tablet Take 20 mg by mouth 3 (three) times daily.    Marland Kitchen spironolactone (ALDACTONE) 25 MG tablet Take 25 mg by mouth every morning.  1  . tiZANidine (ZANAFLEX) 2 MG tablet TAKE 1 TABLET AT BEDTIME DAILY. CAN TAKE 1/2 TABLET IN THE AM AS NEEDED 135 tablet 1  . traMADol (ULTRAM) 50 MG tablet TAKE 1 TABLET BY MOUTH TWICE A DAY. 90 tablet 5  . Vitamin D, Ergocalciferol, (DRISDOL) 50000 units CAPS capsule Take 1 capsule (50,000 Units total) by mouth every 7 (seven) days. 4 capsule 0   No current facility-administered medications on file prior to visit.     PAST MEDICAL HISTORY: Past Medical History:  Diagnosis Date  . Anemia    DURING MENSES--HAS HEAVY BLEEDING WITH PERODS  . Anxiety   . Back pain, chronic    "ongoing"  . Blood transfusion    IN 2012  AFTER C -SECTION  . Cerebral thrombosis with cerebral infarction [facility]) JUNE 2011   RIGHT SIDED WEAKNESS ( ARM AND LEG ) AND SPASMS-remains with slight weakness and vertigo.  . Constipation   . Depression   . Diabetes mellitus   . Diabetic neuropathy ([place])    BOTH FEET --COMES AND GOES  . Edema, lower extremity   . Endometriosis   . Fatty liver   . H/O eye surgery   . Headache(784.0)    MIGRAINES--NOT REALLY HEADACHE-MORE LIKE PRESSURE SENSATION IN HEAD-FEELS DIZZIY AND  FAINT AS THE PRESSURE RESOLVES  . Hernia, incisional, RLQ, s/p lap repair Sep 2013 09/02/2011   . Hx of migraines 10/19/2011  . Hypertension   . Leg pain, right    "like bad Charley horse"  . Multiple food allergies   . Panic disorder without agoraphobia   . Rash    HANDS, ARMS --STATES HX OF RASH EVER SINCE CHILDBIRTH/PREGNANCY.  STATES THE RASH OFTEN OCCURS WHEN SHE IS REALLY STRESSED."goes and comes-presently left ring finger"  . Restless leg syndrome    DIAGNOSED BY SLEEP STUDY - PT TOLD SHE DID NOT HAVE SLEEP APNEA  . Right rotator cuff tear    PAIN IN  RIGHT SHOULDER  . SBO (small bowel obstruction) ([place]) 06/03/2012  . Shortness of breath   . Spastic hemiplegia affecting dominant side ([place])   . Stomach problems   . Stroke ([place])   . Ventral hernia    RIGHT LOWER QUADRANT-CAUSING SOME PAIN  . Weakness of right side of body     PAST SURGICAL HISTORY: Past Surgical History:  Procedure Laterality Date  . APPLICATION OF WOUND VAC N/A 05/25/2015   Procedure: APPLICATION OF WOUND VAC;  Surgeon: Michael Boston, MD;  Location: [facility];  Service: General;  Laterality: N/A;  . CESAREAN SECTION  2012  . DIAGNOSTIC LAPAROSCOPY    . ESOPHAGOGASTRODUODENOSCOPY N/A 06/03/2012   Procedure: ESOPHAGOGASTRODUODENOSCOPY (EGD);  Surgeon: [name] Craver, MD;  Location[facility];  Service: Endoscopy;  Laterality: N/A;  . EXCISION MASS ABDOMINAL N/A 05/25/2015   Procedure: ABDOMINAL WALL EXPLORATION EXCISION OF SEROMA REMOVAL OF REDUNDANT SKIN ;  Surgeon: Michael Boston, MD;  Location: [facility];  Service: General;  Laterality: N/A;  . EYE SURGERY     Eye laser for vessel hemorrhaging  . fybroid removal    . HERNIA REPAIR  10/03/11   ventral hernia repair  . INSERTION OF MESH N/A 02/04/2013   Procedure: INSERTION OF MESH;  Surgeon: Adin Hector, MD;  Location: [facility];  Service: General;  Laterality: N/A;  . UMBILICAL HERNIA REPAIR N/A 02/04/2013   Procedure: LAPAROSCOPIC ventral wall hernia repair LAPAROSCOPIC LYSIS OF ADHESIONS laparoscopic exploration of abdomen ;  Surgeon: Adin Hector, MD;   Location: [facility];  Service: General;  Laterality: N/A;  . URETER REVISION     Bilateral "twisted"  . UTERINE FIBROID SURGERY     2 SURGERIES FOR FIBROIDS  . VENTRAL HERNIA REPAIR  10/03/2011   Procedure: LAPAROSCOPIC VENTRAL HERNIA;  Surgeon: Adin Hector, MD;  Location: [facility];  Service: General;  Laterality: N/A;    SOCIAL HISTORY: Social History   Tobacco Use  . Smoking status: Never Smoker  . Smokeless tobacco: Never Used  Substance Use Topics  . Alcohol use: No    Alcohol/week: 0.0 standard drinks  . Drug use: No    FAMILY HISTORY: Family History  Problem Relation Age of Onset  . Diabetes Father   . Kidney disease Father   . Depression Father   . Drug abuse Father   . Allergic rhinitis Mother   . Eczema Mother   . Urticaria Mother   . Depression Mother   . Anxiety disorder Mother   . Bipolar disorder Mother   . Alcoholism Mother   . Drug abuse Mother   . Eating disorder Mother   . Diabetes Maternal Grandmother   . Hyperlipidemia Paternal Grandmother   . Stroke Paternal Grandmother   . Eczema Sister   . Urticaria Sister   . Colon cancer Paternal Uncle   . Other Neg Hx   . Angioedema Neg Hx   . Asthma Neg Hx     ROS: Review of Systems  Constitutional: Positive for weight loss.  Cardiovascular: Negative for chest pain.  Gastrointestinal: Negative for nausea and vomiting.  Musculoskeletal:       Negative for muscle weakness  Neurological: Negative for headaches.  Endo/Heme/Allergies:       Positive for hypoglycemia    PHYSICAL EXAM: Blood pressure (!) 142/83, pulse 69, temperature (!) 97.3 F (36.3 C), temperature source Oral, height 5\' 7"  (1.702 m), weight 228 lb (103.4 kg), SpO2 94 %. Body mass index is 35.71 kg/m. Physical  Exam  Constitutional: She is oriented to person, place, and time. She appears well-developed and well-nourished.  Cardiovascular: Normal rate.  Pulmonary/Chest: Effort normal.  Musculoskeletal: Normal range of motion.    Neurological: She is oriented to person, place, and time.  Skin: Skin is warm and dry.  Psychiatric: She has a normal mood and affect. Her behavior is normal.  Vitals reviewed.   RECENT LABS AND TESTS: BMET    Component Value Date/Time   NA 141 11/03/2017 1310   K 3.7 11/03/2017 1310   CL 100 11/03/2017 1310   CO2 24 11/03/2017 1310   GLUCOSE 128 (H) 11/03/2017 1310   GLUCOSE 141 (H) 07/05/2017 1437   BUN 11 11/03/2017 1310   CREATININE 0.70 11/03/2017 1310   CALCIUM 9.1 11/03/2017 1310   GFRNONAA 103 11/03/2017 1310   GFRAA 118 11/03/2017 1310   Lab Results  Component Value Date   HGBA1C 8.4 (H) 11/03/2017   HGBA1C 7.5 (H) 05/18/2015   HGBA1C 8.7 (H) 02/05/2013   HGBA1C 6.9 (H) 06/03/2012   HGBA1C 8.3 (H) 10/17/2011   No results found for: INSULIN CBC    Component Value Date/Time   WBC 6.1 07/05/2017 1437   RBC 4.69 07/05/2017 1437   HGB 12.1 07/05/2017 1437   HCT 38.4 07/05/2017 1437   PLT 317 07/05/2017 1437   MCV 81.9 07/05/2017 1437   MCH 25.8 (L) 07/05/2017 1437   MCHC 31.5 07/05/2017 1437   RDW 13.9 07/05/2017 1437   LYMPHSABS 1.3 05/11/2017 1323   MONOABS 0.6 05/11/2017 1323   EOSABS 0.2 05/11/2017 1323   BASOSABS 0.0 05/11/2017 1323   Iron/TIBC/Ferritin/ %Sat    Component Value Date/Time   IRON 114 11/01/2015 1031   TIBC 372 11/01/2015 1031   FERRITIN 35 11/01/2015 1031   IRONPCTSAT 31 11/01/2015 1031   Lipid Panel     Component Value Date/Time   CHOL 230 (H) 11/03/2017 1310   TRIG 211 (H) 11/03/2017 1310   HDL 50 11/03/2017 1310   CHOLHDL 6.0 10/17/2011 0315   VLDL 59 (H) 10/17/2011 0315   LDLCALC 138 (H) 11/03/2017 1310   Hepatic Function Panel     Component Value Date/Time   PROT 6.8 11/03/2017 1310   ALBUMIN 3.8 11/03/2017 1310   AST 14 11/03/2017 1310   ALT 13 11/03/2017 1310   ALKPHOS 54 11/03/2017 1310   BILITOT 0.3 11/03/2017 1310   BILIDIR <0.1 06/03/2012 0220   IBILI NOT CALCULATED 06/03/2012 0220      Component Value  Date/Time   TSH 0.947 11/03/2017 1310   TSH 2.020 09/08/2017 1129   TSH 1.045 06/03/2012 0520   Results for TOREY, REGAN (MRN [id number]) as of 12/07/2017 11:13  Ref. Range 11/03/2017 13:10  Vitamin D, 25-Hydroxy Latest Ref Range: 30.0 - 100.0 ng/mL 10.3 (L)   ASSESSMENT AND PLAN: Type 2 diabetes mellitus with diabetic nephropathy, with long-term current use of insulin (HCC)  Vitamin D deficiency  Essential hypertension  Class 2 severe obesity with serious comorbidity and body mass index (BMI) of 35.0 to 35.9 in adult, unspecified obesity type ([place])  PLAN:  Diabetes II with diabetic neuropathy, uncontrolled Kathleen has been given extensive diabetes education by myself today including ideal fasting and post-prandial blood glucose readings, individual ideal Hgb A1c goals and hypoglycemia prevention. We discussed the importance of good blood sugar control to decrease the likelihood of diabetic complications such as nephropathy, neuropathy, limb loss, blindness, coronary artery disease, and death. We discussed the importance of intensive lifestyle  modification including diet, exercise and weight loss as the first line treatment for diabetes. Larry agrees to continue her diabetes medications and will follow up at the agreed upon time.  Vitamin D Deficiency Kimberlynn was informed that low vitamin D levels contributes to fatigue and are associated with obesity, breast, and colon cancer. She agrees to continue to take high dose prescription Vit D @50 ,000 IU every week and will follow up for routine testing of vitamin D, at least 2-3 times per year. She was informed of the risk of over-replacement of vitamin D and agrees to not increase her dose unless she discusses this with [name] first.  Hypertension We discussed sodium restriction, working on healthy weight loss, and a regular exercise program as the means to achieve improved blood pressure control. Brieonna agreed with this plan and agreed to  follow up as directed. We will continue to monitor her blood pressure as well as her progress with the above lifestyle modifications. She will continue her medications as prescribed and will watch for signs of hypotension as she continues her lifestyle modifications. Journei will continue to see her cardiologist.  I spent > than 50% of the 15 minute visit on counseling as documented in the note.  Obesity Jolynda is currently in the action stage of change. As such, her goal is to continue with weight loss efforts She has agreed to follow the Category 3 plan Barbie has been instructed to work up to a goal of 150 minutes of combined cardio and strengthening exercise per week for weight loss and overall health benefits. We discussed the following Behavioral Modification Strategies today: increase H2O intake, no skipping meals, increasing lean protein intake, decreasing simple carbohydrates, increasing vegetables, decrease eating out, work on more meal planning and easy cooking plans, holiday eating strategies  and celebration eating strategies "Thanksgiving" handout was given to patient today.  Cacey has agreed to follow up with our clinic in 2 weeks. She was informed of the importance of frequent follow up visits to maximize her success with intensive lifestyle modifications for her multiple health conditions.   OBESITY BEHAVIORAL INTERVENTION VISIT  Today's visit was # 3   Starting weight: 238 lbs Starting date: 11/03/17 Today's weight : 228 lbs  Today's date: 12/01/2017 Total lbs lost to date: 10   ASK: We discussed the diagnosis of obesity with Kalicia Carmelia [name] today and Matea agreed to give [name] permission to discuss obesity behavioral modification therapy today.  ASSESS: Kalesha has the diagnosis of obesity and her BMI today is 35.7 Tina [name] is in the action stage of change   ADVISE: Tina [name] was educated on the multiple health risks of obesity as well as the benefit of weight  loss to improve her health. She was advised of the need for long term treatment and the importance of lifestyle modifications to improve her current health and to decrease her risk of future health problems.  AGREE: Multiple dietary modification options and treatment options were discussed and  Tina [name] agreed to follow the recommendations documented in the above note.  ARRANGE: Tina [name] was educated on the importance of frequent visits to treat obesity as outlined per CMS and USPSTF guidelines and agreed to schedule her next follow up appointment today.  Corey Skains, am acting as Location manager for [employer]. Owens Shark, DO  I have reviewed the above documentation for accuracy and completeness, and I agree with the above. -Jearld Lesch, DO

## 2017-12-14 DIAGNOSIS — E118 Type 2 diabetes mellitus with unspecified complications: Secondary | ICD-10-CM | POA: Diagnosis not present

## 2017-12-14 DIAGNOSIS — Z0000 Encounter for general adult medical examination without abnormal findings: Secondary | ICD-10-CM | POA: Diagnosis not present

## 2017-12-14 DIAGNOSIS — I10 Essential (primary) hypertension: Secondary | ICD-10-CM | POA: Diagnosis not present

## 2017-12-14 DIAGNOSIS — E7800 Pure hypercholesterolemia, unspecified: Secondary | ICD-10-CM | POA: Diagnosis not present

## 2017-12-17 ENCOUNTER — Ambulatory Visit (INDEPENDENT_AMBULATORY_CARE_PROVIDER_SITE_OTHER): Payer: Federal, State, Local not specified - PPO | Admitting: Family Medicine

## 2017-12-17 ENCOUNTER — Encounter (INDEPENDENT_AMBULATORY_CARE_PROVIDER_SITE_OTHER): Payer: Self-pay | Admitting: Family Medicine

## 2017-12-17 VITALS — BP 110/73 | HR 75 | Temp 98.1°F | Ht 67.0 in | Wt 231.0 lb

## 2017-12-17 DIAGNOSIS — Z6836 Body mass index (BMI) 36.0-36.9, adult: Secondary | ICD-10-CM | POA: Diagnosis not present

## 2017-12-17 DIAGNOSIS — E1165 Type 2 diabetes mellitus with hyperglycemia: Secondary | ICD-10-CM | POA: Diagnosis not present

## 2017-12-17 DIAGNOSIS — Z794 Long term (current) use of insulin: Secondary | ICD-10-CM | POA: Diagnosis not present

## 2017-12-21 ENCOUNTER — Encounter (INDEPENDENT_AMBULATORY_CARE_PROVIDER_SITE_OTHER): Payer: Self-pay | Admitting: Family Medicine

## 2017-12-21 NOTE — Progress Notes (Signed)
Office: [phone]  /  Fax: [phone]   HPI:   Chief Complaint: OBESITY Tina [name] is here to discuss her progress with her obesity treatment plan. She is on the  follow the Category 3 plan and is following her eating plan approximately 50 % of the time. She states she is exercising by walking for 30 minutes 2 times per week. Tina [name] was off tack on Thanksgiving. This was the first Thanksgiving without mother in law whom passed away. She is back on track now. She is not eating all protein. She gets tired of meat.  Her weight is 231 lb (104.8 kg) today and has had a weight gain of 3 pounds over a period of 2 weeks since her last visit. She has lost 7 lbs since starting treatment with [name].  Diabetes II Tina [name] has a diagnosis of uncontrolled diabetes type II. Tina [name] states fasting BGs range between 100 and 211 and 2 hour postprandials are in the 140s. Her BGs have decreased with meal plan and denies any hypoglycemic episodes. Last A1c was 7.9 yesterday.  She has been working on intensive lifestyle modifications including diet, exercise, and weight loss to help control her blood glucose levels. She sees endocrinology and is currently on Victoza, Novolog, and Engineer, agricultural.   ALLERGIES: Allergies  Allergen Reactions  . Contrast Media [Iodinated Diagnostic Agents] Other (See Comments)    Difficulty breathing  . Iohexol Hives, Nausea And Vomiting and Swelling     Desc: Magnevist-gadolinium-difficulty breathing, throat swelling   . Midazolam Hcl Anaphylaxis    Difficulty breathing  . Shellfish Allergy Anaphylaxis  . Valsartan Swelling  . Metformin And Related Diarrhea and Nausea And Vomiting  . Other Itching    Patient is allergic to all nuts except peanuts.   . Avandia [Rosiglitazone Maleate] Hives and Other (See Comments)  . Geodon [Ziprasidone] Other (See Comments)    UNKNOWN  . Kiwi Extract Itching and Swelling  . Latex Itching    MEDICATIONS: Current Outpatient Medications on File  Prior to Visit  Medication Sig Dispense Refill  . allopurinol (ZYLOPRIM) 100 MG tablet Take 100 mg by mouth every evening.    Marland Kitchen amLODipine (NORVASC) 10 MG tablet Take 1 tablet (10 mg total) by mouth daily. 30 tablet 0  . aspirin 325 MG tablet Take 325 mg by mouth at bedtime.     . cetirizine (ZYRTEC) 10 MG tablet Take 10 mg by mouth daily as needed.  11  . cloNIDine (CATAPRES) 0.1 MG tablet Take 0.1 mg by mouth 2 (two) times daily.  0  . colchicine 0.6 MG tablet Take 0.6 mg by mouth daily as needed (For gout flare-up.).   1  . diphenhydrAMINE (BENADRYL) 25 MG tablet Take 1 tablet (25 mg total) by mouth every 6 (six) hours. 12 tablet 0  . EPINEPHrine 0.3 mg/0.3 mL IJ SOAJ injection INJECT AS DIRECTED AS NEEDED FOR ALLERGIC REACTION 1 Device 2  . furosemide (LASIX) 40 MG tablet Take 40 mg by mouth as needed.    . gabapentin (NEURONTIN) 600 MG tablet Take 600-1,200 mg by mouth See admin instructions. Take 2 tablets every morning, 1 tablet at noon and 2 tablets every evening    . guanFACINE (INTUNIV) 1 MG TB24 ER tablet Take 1 mg by mouth at bedtime.     Marland Kitchen guanFACINE (TENEX) 1 MG tablet Take 1 mg by mouth at bedtime.    . hydrochlorothiazide (HYDRODIURIL) 50 MG tablet Take 50 mg by mouth daily.     Marland Kitchen  insulin aspart (NOVOLOG) 100 UNIT/ML injection Inject 5-25 Units into the skin 3 (three) times daily with meals. Per sliding scale--pt uses Omnipod and gets a basal rate    . Insulin Glargine (BASAGLAR KWIKPEN) 100 UNIT/ML SOPN Inject into the skin daily.    . Liraglutide (VICTOZA) 18 MG/3ML SOPN Inject 0.6 mg into the skin daily.     Marland Kitchen MACA ROOT PO Take 950 mg by mouth 2 (two) times daily. [employer] - organic Maca Root Black, Red, Yellow 950MG  capsule    . mometasone (ELOCON) 0.1 % cream 1-2 times per day 45 g 1  . montelukast (SINGULAIR) 10 MG tablet Take 1 tablet (10 mg total) by mouth at bedtime. 30 tablet 5  . naproxen (NAPROSYN) 500 MG tablet Take 1 tablet (500 mg total) by mouth 2 (two)  times daily. 14 tablet 0  . OVER THE COUNTER MEDICATION Take 1 tablet by mouth 2 (two) times daily. hylands - bioplasma    . propranolol (INDERAL) 20 MG tablet Take 20 mg by mouth 3 (three) times daily.    Marland Kitchen spironolactone (ALDACTONE) 25 MG tablet Take 25 mg by mouth every morning.  1  . tiZANidine (ZANAFLEX) 2 MG tablet TAKE 1 TABLET AT BEDTIME DAILY. CAN TAKE 1/2 TABLET IN THE AM AS NEEDED 135 tablet 1  . traMADol (ULTRAM) 50 MG tablet TAKE 1 TABLET BY MOUTH TWICE A DAY. 90 tablet 5  . Vitamin D, Ergocalciferol, (DRISDOL) 50000 units CAPS capsule Take 1 capsule (50,000 Units total) by mouth every 7 (seven) days. 4 capsule 0   No current facility-administered medications on file prior to visit.     PAST MEDICAL HISTORY: Past Medical History:  Diagnosis Date  . Anemia    DURING MENSES--HAS HEAVY BLEEDING WITH PERODS  . Anxiety   . Back pain, chronic    "ongoing"  . Blood transfusion    IN 2012  AFTER C -SECTION  . Cerebral thrombosis with cerebral infarction [facility]) JUNE 2011   RIGHT SIDED WEAKNESS ( ARM AND LEG ) AND SPASMS-remains with slight weakness and vertigo.  . Constipation   . Depression   . Diabetes mellitus   . Diabetic neuropathy ([place])    BOTH FEET --COMES AND GOES  . Edema, lower extremity   . Endometriosis   . Fatty liver   . H/O eye surgery   . Headache(784.0)    MIGRAINES--NOT REALLY HEADACHE-MORE LIKE PRESSURE SENSATION IN HEAD-FEELS DIZZIY AND  FAINT AS THE PRESSURE RESOLVES  . Hernia, incisional, RLQ, s/p lap repair Sep 2013 09/02/2011  . Hx of migraines 10/19/2011  . Hypertension   . Leg pain, right    "like bad Charley horse"  . Multiple food allergies   . Panic disorder without agoraphobia   . Rash    HANDS, ARMS --STATES HX OF RASH EVER SINCE CHILDBIRTH/PREGNANCY.  STATES THE RASH OFTEN OCCURS WHEN SHE IS REALLY STRESSED."goes and comes-presently left ring finger"  . Restless leg syndrome    DIAGNOSED BY SLEEP STUDY - PT TOLD SHE DID NOT HAVE SLEEP APNEA   . Right rotator cuff tear    PAIN IN RIGHT SHOULDER  . SBO (small bowel obstruction) ([place]) 06/03/2012  . Shortness of breath   . Spastic hemiplegia affecting dominant side ([place])   . Stomach problems   . Stroke ([place])   . Ventral hernia    RIGHT LOWER QUADRANT-CAUSING SOME PAIN  . Weakness of right side of body     PAST SURGICAL HISTORY: Past  Surgical History:  Procedure Laterality Date  . APPLICATION OF WOUND VAC N/A 05/25/2015   Procedure: APPLICATION OF WOUND VAC;  Surgeon: Michael Boston, MD;  Location: [facility];  Service: General;  Laterality: N/A;  . CESAREAN SECTION  2012  . DIAGNOSTIC LAPAROSCOPY    . ESOPHAGOGASTRODUODENOSCOPY N/A 06/03/2012   Procedure: ESOPHAGOGASTRODUODENOSCOPY (EGD);  Surgeon: [name] Craver, MD;  Location: [facility] ENDOSCOPY;  Service: Endoscopy;  Laterality: N/A;  . EXCISION MASS ABDOMINAL N/A 05/25/2015   Procedure: ABDOMINAL WALL EXPLORATION EXCISION OF SEROMA REMOVAL OF REDUNDANT SKIN ;  Surgeon: Michael Boston, MD;  Location: [facility];  Service: General;  Laterality: N/A;  . EYE SURGERY     Eye laser for vessel hemorrhaging  . fybroid removal    . HERNIA REPAIR  10/03/11   ventral hernia repair  . INSERTION OF MESH N/A 02/04/2013   Procedure: INSERTION OF MESH;  Surgeon: Adin Hector, MD;  Location: [facility];  Service: General;  Laterality: N/A;  . UMBILICAL HERNIA REPAIR N/A 02/04/2013   Procedure: LAPAROSCOPIC ventral wall hernia repair LAPAROSCOPIC LYSIS OF ADHESIONS laparoscopic exploration of abdomen ;  Surgeon: Adin Hector, MD;  Location: [facility];  Service: General;  Laterality: N/A;  . URETER REVISION     Bilateral "twisted"  . UTERINE FIBROID SURGERY     2 SURGERIES FOR FIBROIDS  . VENTRAL HERNIA REPAIR  10/03/2011   Procedure: LAPAROSCOPIC VENTRAL HERNIA;  Surgeon: Adin Hector, MD;  Location: [facility];  Service: General;  Laterality: N/A;    SOCIAL HISTORY: Social History   Tobacco Use  . Smoking status: Never Smoker  . Smokeless tobacco: Never  Used  Substance Use Topics  . Alcohol use: No    Alcohol/week: 0.0 standard drinks  . Drug use: No    FAMILY HISTORY: Family History  Problem Relation Age of Onset  . Diabetes Father   . Kidney disease Father   . Depression Father   . Drug abuse Father   . Allergic rhinitis Mother   . Eczema Mother   . Urticaria Mother   . Depression Mother   . Anxiety disorder Mother   . Bipolar disorder Mother   . Alcoholism Mother   . Drug abuse Mother   . Eating disorder Mother   . Diabetes Maternal Grandmother   . Hyperlipidemia Paternal Grandmother   . Stroke Paternal Grandmother   . Eczema Sister   . Urticaria Sister   . Colon cancer Paternal Uncle   . Other Neg Hx   . Angioedema Neg Hx   . Asthma Neg Hx     ROS: Review of Systems  Constitutional: Negative for weight loss.  Endo/Heme/Allergies:       Negative for hypoglycemia    PHYSICAL EXAM: Blood pressure 110/73, pulse 75, temperature 98.1 F (36.7 C), temperature source Oral, height 5\' 7"  (1.702 m), weight 231 lb (104.8 kg), SpO2 100 %. Body mass index is 36.18 kg/m. Physical Exam  Constitutional: She is oriented to person, place, and time. She appears well-developed and well-nourished.  HENT:  Head: Normocephalic.  Eyes: Pupils are equal, round, and reactive to light.  Neck: Normal range of motion.  Cardiovascular: Normal rate.  Pulmonary/Chest: Effort normal.  Musculoskeletal: Normal range of motion.  Neurological: She is alert and oriented to person, place, and time.  Skin: Skin is warm and dry.  Psychiatric: She has a normal mood and affect. Her behavior is normal.  Vitals reviewed.   RECENT LABS AND TESTS: BMET  Component Value Date/Time   NA 141 11/03/2017 1310   K 3.7 11/03/2017 1310   CL 100 11/03/2017 1310   CO2 24 11/03/2017 1310   GLUCOSE 128 (H) 11/03/2017 1310   GLUCOSE 141 (H) 07/05/2017 1437   BUN 11 11/03/2017 1310   CREATININE 0.70 11/03/2017 1310   CALCIUM 9.1 11/03/2017 1310    GFRNONAA 103 11/03/2017 1310   GFRAA 118 11/03/2017 1310   Lab Results  Component Value Date   HGBA1C 8.4 (H) 11/03/2017   HGBA1C 7.5 (H) 05/18/2015   HGBA1C 8.7 (H) 02/05/2013   HGBA1C 6.9 (H) 06/03/2012   HGBA1C 8.3 (H) 10/17/2011   No results found for: INSULIN CBC    Component Value Date/Time   WBC 6.1 07/05/2017 1437   RBC 4.69 07/05/2017 1437   HGB 12.1 07/05/2017 1437   HCT 38.4 07/05/2017 1437   PLT 317 07/05/2017 1437   MCV 81.9 07/05/2017 1437   MCH 25.8 (L) 07/05/2017 1437   MCHC 31.5 07/05/2017 1437   RDW 13.9 07/05/2017 1437   LYMPHSABS 1.3 05/11/2017 1323   MONOABS 0.6 05/11/2017 1323   EOSABS 0.2 05/11/2017 1323   BASOSABS 0.0 05/11/2017 1323   Iron/TIBC/Ferritin/ %Sat    Component Value Date/Time   IRON 114 11/01/2015 1031   TIBC 372 11/01/2015 1031   FERRITIN 35 11/01/2015 1031   IRONPCTSAT 31 11/01/2015 1031   Lipid Panel     Component Value Date/Time   CHOL 230 (H) 11/03/2017 1310   TRIG 211 (H) 11/03/2017 1310   HDL 50 11/03/2017 1310   CHOLHDL 6.0 10/17/2011 0315   VLDL 59 (H) 10/17/2011 0315   LDLCALC 138 (H) 11/03/2017 1310   Hepatic Function Panel     Component Value Date/Time   PROT 6.8 11/03/2017 1310   ALBUMIN 3.8 11/03/2017 1310   AST 14 11/03/2017 1310   ALT 13 11/03/2017 1310   ALKPHOS 54 11/03/2017 1310   BILITOT 0.3 11/03/2017 1310   BILIDIR <0.1 06/03/2012 0220   IBILI NOT CALCULATED 06/03/2012 0220      Component Value Date/Time   TSH 0.947 11/03/2017 1310   TSH 2.020 09/08/2017 1129   TSH 1.045 06/03/2012 0520    ASSESSMENT AND PLAN: Type 2 diabetes mellitus without complication, with long-term current use of insulin (HCC)  Class 2 severe obesity with serious comorbidity and body mass index (BMI) of 36.0 to 36.9 in adult, unspecified obesity type ([place])  PLAN: Diabetes II Tina [name] has been given extensive diabetes education by myself today including ideal fasting and post-prandial blood glucose readings,  individual ideal HgA1c goals  and hypoglycemia prevention. We discussed the importance of good blood sugar control to decrease the likelihood of diabetic complications such as nephropathy, neuropathy, limb loss, blindness, coronary artery disease, and death. We discussed the importance of intensive lifestyle modification including diet, exercise and weight loss as the first line treatment for diabetes. Tina [name] agrees to continue her diabetes medications and will follow up at the agreed upon time. She will follow up with endocrinology in January.   I spent > than 50% of the 15 minute visit on counseling as documented in the note.  Obesity Tina [name] is currently in the action stage of change. As such, her goal is to continue with weight loss efforts She has agreed to follow the Category 3 plan with breakfast options Handout given on breakfast options.  Tina [name] has been instructed to continue walking for 30 minutes 2 times per week for weight loss and overall health  benefits. We discussed the following Behavioral Modification Strategies today: increasing lean protein intake, planning for success, and holiday eating strategies    Tina [name] has agreed to follow up with our clinic in 2 weeks. She was informed of the importance of frequent follow up visits to maximize her success with intensive lifestyle modifications for her multiple health conditions.   OBESITY BEHAVIORAL INTERVENTION VISIT  Today's visit was # 4   Starting weight: 238lb Starting date: 02/03/17 Today's weight : Weight: 231 lb (104.8 kg)  Today's date: 12/17/17 Total lbs lost to date: 7 lb.   ASK: We discussed the diagnosis of obesity with Tina [name] Tina [name] today and Tina [name] agreed to give [name] permission to discuss obesity behavioral modification therapy today.  ASSESS: Tina [name] has the diagnosis of obesity and her BMI today is 36.17 Tina [name] is in the action stage of change   ADVISE: Tina [name] was educated on the multiple health  risks of obesity as well as the benefit of weight loss to improve her health. She was advised of the need for long term treatment and the importance of lifestyle modifications to improve her current health and to decrease her risk of future health problems.  AGREE: Multiple dietary modification options and treatment options were discussed and  Tina [name] agreed to follow the recommendations documented in the above note.  ARRANGE: Tina [name] was educated on the importance of frequent visits to treat obesity as outlined per CMS and USPSTF guidelines and agreed to schedule her next follow up appointment today.  I, Renee Ramus, am acting as Location manager for [employer], FNP-C.  I have reviewed the above documentation for accuracy and completeness, and I agree with the above.  - Hakan Nudelman, FNP-C.

## 2017-12-30 ENCOUNTER — Encounter (INDEPENDENT_AMBULATORY_CARE_PROVIDER_SITE_OTHER): Payer: Self-pay | Admitting: Family Medicine

## 2017-12-30 ENCOUNTER — Ambulatory Visit (INDEPENDENT_AMBULATORY_CARE_PROVIDER_SITE_OTHER): Payer: Federal, State, Local not specified - PPO | Admitting: Family Medicine

## 2017-12-30 VITALS — BP 123/75 | HR 68 | Temp 97.5°F | Ht 67.0 in | Wt 228.0 lb

## 2017-12-30 DIAGNOSIS — E1140 Type 2 diabetes mellitus with diabetic neuropathy, unspecified: Secondary | ICD-10-CM | POA: Diagnosis not present

## 2017-12-30 DIAGNOSIS — Z9189 Other specified personal risk factors, not elsewhere classified: Secondary | ICD-10-CM

## 2017-12-30 DIAGNOSIS — Z794 Long term (current) use of insulin: Secondary | ICD-10-CM | POA: Diagnosis not present

## 2017-12-30 DIAGNOSIS — E559 Vitamin D deficiency, unspecified: Secondary | ICD-10-CM | POA: Diagnosis not present

## 2017-12-30 DIAGNOSIS — Z6835 Body mass index (BMI) 35.0-35.9, adult: Secondary | ICD-10-CM

## 2017-12-30 MED ORDER — VITAMIN D (ERGOCALCIFEROL) 1.25 MG (50000 UNIT) PO CAPS: 50000.0000 [IU] | ORAL_CAPSULE | ORAL | 0 refills | Status: DC

## 2017-12-31 ENCOUNTER — Encounter (INDEPENDENT_AMBULATORY_CARE_PROVIDER_SITE_OTHER): Payer: Self-pay | Admitting: Family Medicine

## 2017-12-31 DIAGNOSIS — E559 Vitamin D deficiency, unspecified: Secondary | ICD-10-CM | POA: Insufficient documentation

## 2017-12-31 NOTE — Progress Notes (Signed)
Office: [phone]  /  Fax: [phone]   HPI:   Chief Complaint: OBESITY Tina [name] is here to discuss her progress with her obesity treatment plan. She is on the Category 3 plan with breakfast options and is following her eating plan approximately 50 % of the time. She states she is exercising 0 minutes 0 times per week. Tina [name] has increased stress related to the holidays and it is causing her to make poor food choices. She is going to [place] for Christmas.  Her weight is 228 lb (103.4 kg) today and has had a weight loss of 3 pounds over a period of 2 weeks since her last visit. She has lost 10 lbs since starting treatment with [name].  Vitamin D deficiency Tina [name] has a diagnosis of vitamin D deficiency. She is currently taking vit D, but is not at goal. Her last vitamin D level was 10.3 on 11/03/17. She admits fatigue and denies nausea, vomiting, or muscle weakness.  At risk for osteopenia and osteoporosis Tina [name] is at higher risk of osteopenia and osteoporosis due to vitamin D deficiency.   Diabetes II Tina [name] has a diagnosis of diabetes type II. Tina [name] has had increased stress and pain that is related to a recent dental implant. She states that her fasting BGs range between 60 and 200 and her 2 hour post prandial sugars range between 200 and 280 and have been very high. Tina [name] is not well controlled but her A1c has improved from 8.4 on 11/01/17 to 7.9 on 12/16/17. She is followed by endocrinology and her next appointment is in January. Tina [name] denies any hypoglycemic episodes. She has been working on intensive lifestyle modifications including diet, exercise, and weight loss to help control her blood glucose levels.  ASSESSMENT AND PLAN:  Vitamin D deficiency - Plan: Vitamin D, Ergocalciferol, (DRISDOL) 1.25 MG (50000 UT) CAPS capsule  Type 2 diabetes mellitus without complication, with long-term current use of insulin (HCC)  At risk for osteoporosis  Class 2 severe obesity  with serious comorbidity and body mass index (BMI) of 35.0 to 35.9 in adult, unspecified obesity type ([facility])  PLAN:  Vitamin D Deficiency Tina [name] was informed that low vitamin D levels contributes to fatigue and are associated with obesity, breast, and colon cancer. She agrees to continue to take prescription Vit D @50 ,000 IU every week #4 with no refills and will follow up for routine testing of vitamin D, at least 2-3 times per year. She was informed of the risk of over-replacement of vitamin D and agrees to not increase her dose unless she discusses this with [name] first. We will recheck her vitamin D level in about 1 month. Tina [name] agrees to follow up in 3 weeks.  At risk for osteopenia and osteoporosis Tina [name] was given extended (15 minutes) osteoporosis prevention counseling today. Tina [name] is at risk for osteopenia and osteoporosis due to her vitamin D deficiency. She was encouraged to take her vitamin D and follow her higher calcium diet and increase strengthening exercise to help strengthen her bones and decrease her risk of osteopenia and osteoporosis.  Diabetes II Tina [name] has been given extensive diabetes education by myself today including ideal fasting and post-prandial blood glucose readings, individual ideal Hgb A1c goals, and hypoglycemia prevention. We discussed the importance of good blood sugar control to decrease the likelihood of diabetic complications such as nephropathy, neuropathy, limb loss, blindness, coronary artery disease, and death. We discussed the importance of intensive lifestyle modification including diet, exercise and weight loss as the first  line treatment for diabetes. Tina [name] agrees to continue her Novalog 5-25 units, Basaglar 100 units, and Victoza 0.6mg . She has a freestyle Libre to check blood sugars. Tina [name] agrees to follow up with her endocrinologist as scheduled in January and will follow up at the agreed upon time in 3 weeks.  Obesity Tina [name] is currently in  the action stage of change. As such, her goal is to continue with weight loss efforts. She has agreed to follow the Category 3 plan with breakfast options and keeping a food journal of 450 to 600 calories and 40+ grams of protein for supper. We discussed the following Behavioral Modification Strategies today: work on meal planning and easy cooking plans, travel eating strategies, celebration eating strategies, and planning for success.  Tina [name] has agreed to follow up with our clinic in 3 weeks. She was informed of the importance of frequent follow up visits to maximize her success with intensive lifestyle modifications for her multiple health conditions.  ALLERGIES: Allergies  Allergen Reactions  . Contrast Media [Iodinated Diagnostic Agents] Other (See Comments)    Difficulty breathing  . Iohexol Hives, Nausea And Vomiting and Swelling     Desc: Magnevist-gadolinium-difficulty breathing, throat swelling   . Midazolam Hcl Anaphylaxis    Difficulty breathing  . Shellfish Allergy Anaphylaxis  . Valsartan Swelling  . Metformin And Related Diarrhea and Nausea And Vomiting  . Other Itching    Patient is allergic to all nuts except peanuts.   . Avandia [Rosiglitazone Maleate] Hives and Other (See Comments)  . Geodon [Ziprasidone] Other (See Comments)    UNKNOWN  . Kiwi Extract Itching and Swelling  . Latex Itching    MEDICATIONS: Current Outpatient Medications on File Prior to Visit  Medication Sig Dispense Refill  . allopurinol (ZYLOPRIM) 100 MG tablet Take 100 mg by mouth every evening.    Marland Kitchen amLODipine (NORVASC) 10 MG tablet Take 1 tablet (10 mg total) by mouth daily. 30 tablet 0  . aspirin 325 MG tablet Take 325 mg by mouth at bedtime.     . cetirizine (ZYRTEC) 10 MG tablet Take 10 mg by mouth daily as needed.  11  . cloNIDine (CATAPRES) 0.1 MG tablet Take 0.1 mg by mouth 2 (two) times daily.  0  . colchicine 0.6 MG tablet Take 0.6 mg by mouth daily as needed (For gout flare-up.).    1  . diphenhydrAMINE (BENADRYL) 25 MG tablet Take 1 tablet (25 mg total) by mouth every 6 (six) hours. 12 tablet 0  . EPINEPHrine 0.3 mg/0.3 mL IJ SOAJ injection INJECT AS DIRECTED AS NEEDED FOR ALLERGIC REACTION 1 Device 2  . furosemide (LASIX) 40 MG tablet Take 40 mg by mouth as needed.    . gabapentin (NEURONTIN) 600 MG tablet Take 600-1,200 mg by mouth See admin instructions. Take 2 tablets every morning, 1 tablet at noon and 2 tablets every evening    . guanFACINE (INTUNIV) 1 MG TB24 ER tablet Take 1 mg by mouth at bedtime.     Marland Kitchen guanFACINE (TENEX) 1 MG tablet Take 1 mg by mouth at bedtime.    . hydrochlorothiazide (HYDRODIURIL) 50 MG tablet Take 50 mg by mouth daily.     . insulin aspart (NOVOLOG) 100 UNIT/ML injection Inject 5-25 Units into the skin 3 (three) times daily with meals. Per sliding scale--pt uses Omnipod and gets a basal rate    . Insulin Glargine (BASAGLAR KWIKPEN) 100 UNIT/ML SOPN Inject into the skin daily.    . Liraglutide (VICTOZA) 18  MG/3ML SOPN Inject 0.6 mg into the skin daily.     Marland Kitchen MACA ROOT PO Take 950 mg by mouth 2 (two) times daily. [employer] - organic Maca Root Black, Red, Yellow 950MG  capsule    . mometasone (ELOCON) 0.1 % cream 1-2 times per day 45 g 1  . montelukast (SINGULAIR) 10 MG tablet Take 1 tablet (10 mg total) by mouth at bedtime. 30 tablet 5  . naproxen (NAPROSYN) 500 MG tablet Take 1 tablet (500 mg total) by mouth 2 (two) times daily. 14 tablet 0  . OVER THE COUNTER MEDICATION Take 1 tablet by mouth 2 (two) times daily. hylands - bioplasma    . propranolol (INDERAL) 20 MG tablet Take 20 mg by mouth 3 (three) times daily.    Marland Kitchen spironolactone (ALDACTONE) 25 MG tablet Take 25 mg by mouth every morning.  1  . tiZANidine (ZANAFLEX) 2 MG tablet TAKE 1 TABLET AT BEDTIME DAILY. CAN TAKE 1/2 TABLET IN THE AM AS NEEDED 135 tablet 1  . traMADol (ULTRAM) 50 MG tablet TAKE 1 TABLET BY MOUTH TWICE A DAY. 90 tablet 5   No current facility-administered  medications on file prior to visit.     PAST MEDICAL HISTORY: Past Medical History:  Diagnosis Date  . Anemia    DURING MENSES--HAS HEAVY BLEEDING WITH PERODS  . Anxiety   . Back pain, chronic    "ongoing"  . Blood transfusion    IN 2012  AFTER C -SECTION  . Cerebral thrombosis with cerebral infarction [facility]) JUNE 2011   RIGHT SIDED WEAKNESS ( ARM AND LEG ) AND SPASMS-remains with slight weakness and vertigo.  . Constipation   . Depression   . Diabetes mellitus   . Diabetic neuropathy ([place])    BOTH FEET --COMES AND GOES  . Edema, lower extremity   . Endometriosis   . Fatty liver   . H/O eye surgery   . Headache(784.0)    MIGRAINES--NOT REALLY HEADACHE-MORE LIKE PRESSURE SENSATION IN HEAD-FEELS DIZZIY AND  FAINT AS THE PRESSURE RESOLVES  . Hernia, incisional, RLQ, s/p lap repair Sep 2013 09/02/2011  . Hx of migraines 10/19/2011  . Hypertension   . Leg pain, right    "like bad Charley horse"  . Multiple food allergies   . Panic disorder without agoraphobia   . Rash    HANDS, ARMS --STATES HX OF RASH EVER SINCE CHILDBIRTH/PREGNANCY.  STATES THE RASH OFTEN OCCURS WHEN SHE IS REALLY STRESSED."goes and comes-presently left ring finger"  . Restless leg syndrome    DIAGNOSED BY SLEEP STUDY - PT TOLD SHE DID NOT HAVE SLEEP APNEA  . Right rotator cuff tear    PAIN IN RIGHT SHOULDER  . SBO (small bowel obstruction) ([place]) 06/03/2012  . Shortness of breath   . Spastic hemiplegia affecting dominant side ([place])   . Stomach problems   . Stroke ([place])   . Ventral hernia    RIGHT LOWER QUADRANT-CAUSING SOME PAIN  . Weakness of right side of body     PAST SURGICAL HISTORY: Past Surgical History:  Procedure Laterality Date  . APPLICATION OF WOUND VAC N/A 05/25/2015   Procedure: APPLICATION OF WOUND VAC;  Surgeon: Michael Boston, MD;  Location: [facility];  Service: General;  Laterality: N/A;  . CESAREAN SECTION  2012  . DIAGNOSTIC LAPAROSCOPY    . ESOPHAGOGASTRODUODENOSCOPY N/A 06/03/2012    Procedure: ESOPHAGOGASTRODUODENOSCOPY (EGD);  Surgeon: [name] Craver, MD;  Location[facility] ENDOSCOPY;  Service: Endoscopy;  Laterality: N/A;  . EXCISION  MASS ABDOMINAL N/A 05/25/2015   Procedure: ABDOMINAL WALL EXPLORATION EXCISION OF SEROMA REMOVAL OF REDUNDANT SKIN ;  Surgeon: Michael Boston, MD;  Location: [facility];  Service: General;  Laterality: N/A;  . EYE SURGERY     Eye laser for vessel hemorrhaging  . fybroid removal    . HERNIA REPAIR  10/03/11   ventral hernia repair  . INSERTION OF MESH N/A 02/04/2013   Procedure: INSERTION OF MESH;  Surgeon: Adin Hector, MD;  Location: [facility];  Service: General;  Laterality: N/A;  . UMBILICAL HERNIA REPAIR N/A 02/04/2013   Procedure: LAPAROSCOPIC ventral wall hernia repair LAPAROSCOPIC LYSIS OF ADHESIONS laparoscopic exploration of abdomen ;  Surgeon: Adin Hector, MD;  Location: [facility];  Service: General;  Laterality: N/A;  . URETER REVISION     Bilateral "twisted"  . UTERINE FIBROID SURGERY     2 SURGERIES FOR FIBROIDS  . VENTRAL HERNIA REPAIR  10/03/2011   Procedure: LAPAROSCOPIC VENTRAL HERNIA;  Surgeon: Adin Hector, MD;  Location: [facility];  Service: General;  Laterality: N/A;    SOCIAL HISTORY: Social History   Tobacco Use  . Smoking status: Never Smoker  . Smokeless tobacco: Never Used  Substance Use Topics  . Alcohol use: No    Alcohol/week: 0.0 standard drinks  . Drug use: No    FAMILY HISTORY: Family History  Problem Relation Age of Onset  . Diabetes Father   . Kidney disease Father   . Depression Father   . Drug abuse Father   . Allergic rhinitis Mother   . Eczema Mother   . Urticaria Mother   . Depression Mother   . Anxiety disorder Mother   . Bipolar disorder Mother   . Alcoholism Mother   . Drug abuse Mother   . Eating disorder Mother   . Diabetes Maternal Grandmother   . Hyperlipidemia Paternal Grandmother   . Stroke Paternal Grandmother   . Eczema Sister   . Urticaria Sister   . Colon cancer Paternal Uncle    . Other Neg Hx   . Angioedema Neg Hx   . Asthma Neg Hx     ROS: Review of Systems  Constitutional: Positive for malaise/fatigue and weight loss.  Gastrointestinal: Negative for nausea and vomiting.  Musculoskeletal:       Negative for muscle weakness.  Endo/Heme/Allergies:       Negative for hypoglycemia.   PHYSICAL EXAM: Blood pressure 123/75, pulse 68, temperature (!) 97.5 F (36.4 C), temperature source Oral, height 5\' 7"  (1.702 m), weight 228 lb (103.4 kg), SpO2 100 %. Body mass index is 35.71 kg/m. Physical Exam Vitals signs reviewed.  Constitutional:      Appearance: Normal appearance. She is obese.  Cardiovascular:     Rate and Rhythm: Normal rate.  Pulmonary:     Effort: Pulmonary effort is normal.  Musculoskeletal: Normal range of motion.  Skin:    General: Skin is warm and dry.  Neurological:     Mental Status: She is alert and oriented to person, place, and time.  Psychiatric:        Mood and Affect: Mood normal.        Behavior: Behavior normal.    RECENT LABS AND TESTS: BMET    Component Value Date/Time   NA 141 11/03/2017 1310   K 3.7 11/03/2017 1310   CL 100 11/03/2017 1310   CO2 24 11/03/2017 1310   GLUCOSE 128 (H) 11/03/2017 1310   GLUCOSE 141 (H) 07/05/2017 1437  BUN 11 11/03/2017 1310   CREATININE 0.70 11/03/2017 1310   CALCIUM 9.1 11/03/2017 1310   GFRNONAA 103 11/03/2017 1310   GFRAA 118 11/03/2017 1310   Lab Results  Component Value Date   HGBA1C 8.4 (H) 11/03/2017   HGBA1C 7.5 (H) 05/18/2015   HGBA1C 8.7 (H) 02/05/2013   HGBA1C 6.9 (H) 06/03/2012   HGBA1C 8.3 (H) 10/17/2011   No results found for: INSULIN CBC    Component Value Date/Time   WBC 6.1 07/05/2017 1437   RBC 4.69 07/05/2017 1437   HGB 12.1 07/05/2017 1437   HCT 38.4 07/05/2017 1437   PLT 317 07/05/2017 1437   MCV 81.9 07/05/2017 1437   MCH 25.8 (L) 07/05/2017 1437   MCHC 31.5 07/05/2017 1437   RDW 13.9 07/05/2017 1437   LYMPHSABS 1.3 05/11/2017 1323    MONOABS 0.6 05/11/2017 1323   EOSABS 0.2 05/11/2017 1323   BASOSABS 0.0 05/11/2017 1323   Iron/TIBC/Ferritin/ %Sat    Component Value Date/Time   IRON 114 11/01/2015 1031   TIBC 372 11/01/2015 1031   FERRITIN 35 11/01/2015 1031   IRONPCTSAT 31 11/01/2015 1031   Lipid Panel     Component Value Date/Time   CHOL 230 (H) 11/03/2017 1310   TRIG 211 (H) 11/03/2017 1310   HDL 50 11/03/2017 1310   CHOLHDL 6.0 10/17/2011 0315   VLDL 59 (H) 10/17/2011 0315   LDLCALC 138 (H) 11/03/2017 1310   Hepatic Function Panel     Component Value Date/Time   PROT 6.8 11/03/2017 1310   ALBUMIN 3.8 11/03/2017 1310   AST 14 11/03/2017 1310   ALT 13 11/03/2017 1310   ALKPHOS 54 11/03/2017 1310   BILITOT 0.3 11/03/2017 1310   BILIDIR <0.1 06/03/2012 0220   IBILI NOT CALCULATED 06/03/2012 0220      Component Value Date/Time   TSH 0.947 11/03/2017 1310   TSH 2.020 09/08/2017 1129   TSH 1.045 06/03/2012 0520   Results for NEELEY, SEDIVY (MRN [id number]) as of 12/31/2017 09:14  Ref. Range 11/03/2017 13:10  Vitamin D, 25-Hydroxy Latest Ref Range: 30.0 - 100.0 ng/mL 10.3 (L)    OBESITY BEHAVIORAL INTERVENTION VISIT  Today's visit was # 5   Starting weight: 238 lbs Starting date: 11/01/17 Today's weight : Weight: 228 lb (103.4 kg)  Today's date: 12/30/2017 Total lbs lost to date: 10  ASK: We discussed the diagnosis of obesity with Tina [name] today and Tina [name] agreed to give [name] permission to discuss obesity behavioral modification therapy today.  ASSESS: Tina [name] has the diagnosis of obesity and her BMI today is 35.7. Tina [name] is in the action stage of change.   ADVISE: Tina [name] was educated on the multiple health risks of obesity as well as the benefit of weight loss to improve her health. She was advised of the need for long term treatment and the importance of lifestyle modifications to improve her current health and to decrease her risk of future health  problems.  AGREE: Multiple dietary modification options and treatment options were discussed and Tina [name] agreed to follow the recommendations documented in the above note.  ARRANGE: Tina [name] was educated on the importance of frequent visits to treat obesity as outlined per CMS and USPSTF guidelines and agreed to schedule her next follow up appointment today.  Tina [name], Tina [name], am acting as Location manager for [employer], FNP-C.  Tina [name] have reviewed the above documentation for accuracy and completeness, and Tina [name] agree with the above.  - Tina Peltz, FNP-C.

## 2018-01-01 ENCOUNTER — Ambulatory Visit (HOSPITAL_BASED_OUTPATIENT_CLINIC_OR_DEPARTMENT_OTHER): Payer: Federal, State, Local not specified - PPO | Admitting: Physical Medicine & Rehabilitation

## 2018-01-01 ENCOUNTER — Other Ambulatory Visit: Payer: Self-pay

## 2018-01-01 ENCOUNTER — Encounter: Payer: Self-pay | Admitting: Physical Medicine & Rehabilitation

## 2018-01-01 ENCOUNTER — Encounter: Payer: Federal, State, Local not specified - PPO | Attending: Physical Medicine & Rehabilitation

## 2018-01-01 VITALS — BP 139/83 | HR 74 | Ht 67.0 in | Wt 233.8 lb

## 2018-01-01 DIAGNOSIS — Z79891 Long term (current) use of opiate analgesic: Secondary | ICD-10-CM | POA: Diagnosis not present

## 2018-01-01 DIAGNOSIS — M533 Sacrococcygeal disorders, not elsewhere classified: Secondary | ICD-10-CM

## 2018-01-01 DIAGNOSIS — S93491A Sprain of other ligament of right ankle, initial encounter: Secondary | ICD-10-CM | POA: Insufficient documentation

## 2018-01-01 DIAGNOSIS — E1140 Type 2 diabetes mellitus with diabetic neuropathy, unspecified: Secondary | ICD-10-CM | POA: Diagnosis not present

## 2018-01-01 DIAGNOSIS — D649 Anemia, unspecified: Secondary | ICD-10-CM | POA: Insufficient documentation

## 2018-01-01 DIAGNOSIS — Z823 Family history of stroke: Secondary | ICD-10-CM | POA: Insufficient documentation

## 2018-01-01 DIAGNOSIS — E119 Type 2 diabetes mellitus without complications: Secondary | ICD-10-CM | POA: Insufficient documentation

## 2018-01-01 DIAGNOSIS — G8111 Spastic hemiplegia affecting right dominant side: Secondary | ICD-10-CM | POA: Insufficient documentation

## 2018-01-01 DIAGNOSIS — G8929 Other chronic pain: Secondary | ICD-10-CM | POA: Insufficient documentation

## 2018-01-01 DIAGNOSIS — Z833 Family history of diabetes mellitus: Secondary | ICD-10-CM | POA: Insufficient documentation

## 2018-01-01 DIAGNOSIS — I10 Essential (primary) hypertension: Secondary | ICD-10-CM | POA: Diagnosis not present

## 2018-01-01 DIAGNOSIS — I69351 Hemiplegia and hemiparesis following cerebral infarction affecting right dominant side: Secondary | ICD-10-CM | POA: Diagnosis not present

## 2018-01-01 DIAGNOSIS — M545 Low back pain: Secondary | ICD-10-CM | POA: Insufficient documentation

## 2018-01-01 DIAGNOSIS — Z841 Family history of disorders of kidney and ureter: Secondary | ICD-10-CM | POA: Insufficient documentation

## 2018-01-01 DIAGNOSIS — Z9889 Other specified postprocedural states: Secondary | ICD-10-CM | POA: Insufficient documentation

## 2018-01-01 DIAGNOSIS — G43909 Migraine, unspecified, not intractable, without status migrainosus: Secondary | ICD-10-CM | POA: Diagnosis not present

## 2018-01-01 MED ORDER — TIZANIDINE HCL 2 MG PO TABS: ORAL_TABLET | ORAL | 1 refills | Status: DC

## 2018-01-01 NOTE — Progress Notes (Signed)
Subjective:    Patient ID: Tina [name], female    DOB: January 12, 1971, 47 y.o.   MRN: [id number] 47 year old female with left pontine and left basal ganglia infarct resulting in chronic right hemiparesis.  She has had a good recovery and has mild residual weakness.  She still drops objects of the right upper extremity.  She still has alteration in her gait with stiff leg gait on the right which has resulted in right sacroiliac dysfunction.  She had a short-term relief with sacroiliac injection 04/03/2017. 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 General activity 7 Relation with others 5 Enjoyment of life 7 What TIME of day is your pain at its worst? all Sleep (in general) Fair  Pain is worse with: walking, standing and some activites Pain improves with: rest Relief from Meds: 7  Mobility use a cane how many minutes can you walk? 15 ability to climb steps?  yes do you drive?  no  Function employed # of hrs/week 16 disabled: date disabled n/a I need assistance with the following:  bathing, household duties and shopping  Neuro/Psych weakness numbness spasms dizziness confusion depression anxiety loss of taste or smell  Prior Studies Any changes since last visit?  no  Physicians involved in your care Primary care Dr. Theda Sers Psychiatrist Dr. Iven Finn   Family History  Problem Relation Age of Onset  . Diabetes Father   .  Kidney disease Father   . Depression Father   . Drug abuse Father   . Allergic rhinitis Mother   . Eczema Mother   . Urticaria Mother   . Depression Mother   . Anxiety disorder Mother   . Bipolar disorder Mother   . Alcoholism Mother   . Drug abuse Mother   . Eating disorder Mother   . Diabetes Maternal Grandmother   . Hyperlipidemia Paternal Grandmother   . Stroke Paternal Grandmother   . Eczema Sister   . Urticaria Sister   . Colon cancer Paternal Uncle   . Other Neg Hx   . Angioedema Neg Hx   . Asthma Neg Hx    Social History   Socioeconomic History  . Marital status: Married    Spouse name: Annie Main  . Number of children: 2  . Years of education: BA degree  . Highest education level: Not on file  Occupational History  . Occupation: stay at home mom    Employer: UNEMPLOYED  Social Needs  . Financial resource strain: Not on file  . Food insecurity:    Worry: Not on file    Inability: Not on file  . Transportation needs:    Medical: Not on file    Non-medical: Not on file  Tobacco Use  . Smoking status: Never Smoker  . Smokeless tobacco: Never Used  Substance and Sexual Activity  . Alcohol use: No    Alcohol/week: 0.0 standard drinks  .  Drug use: No  . Sexual activity: Yes    Birth control/protection: None  Lifestyle  . Physical activity:    Days per week: Not on file    Minutes per session: Not on file  . Stress: Not on file  Relationships  . Social connections:    Talks on phone: Not on file    Gets together: Not on file    Attends religious service: Not on file    Active member of club or organization: Not on file    Attends meetings of clubs or organizations: Not on file    Relationship status: Not on file  Other Topics Concern  . Not on file  Social History Narrative   Patient is married with 2 children.   Patient is right handed.   Patient has her  BA degree.   Patient drinks 1 cup daily.   Past Surgical History:  Procedure Laterality Date   . APPLICATION OF WOUND VAC N/A 05/25/2015   Procedure: APPLICATION OF WOUND VAC;  Surgeon: Michael Boston, MD;  Location: [facility];  Service: General;  Laterality: N/A;  . CESAREAN SECTION  2012  . DIAGNOSTIC LAPAROSCOPY    . ESOPHAGOGASTRODUODENOSCOPY N/A 06/03/2012   Procedure: ESOPHAGOGASTRODUODENOSCOPY (EGD);  Surgeon: [name] Craver, MD;  Location: [facility] ENDOSCOPY;  Service: Endoscopy;  Laterality: N/A;  . EXCISION MASS ABDOMINAL N/A 05/25/2015   Procedure: ABDOMINAL WALL EXPLORATION EXCISION OF SEROMA REMOVAL OF REDUNDANT SKIN ;  Surgeon: Michael Boston, MD;  Location: [facility];  Service: General;  Laterality: N/A;  . EYE SURGERY     Eye laser for vessel hemorrhaging  . fybroid removal    . HERNIA REPAIR  10/03/11   ventral hernia repair  . INSERTION OF MESH N/A 02/04/2013   Procedure: INSERTION OF MESH;  Surgeon: Adin Hector, MD;  Location: [facility];  Service: General;  Laterality: N/A;  . UMBILICAL HERNIA REPAIR N/A 02/04/2013   Procedure: LAPAROSCOPIC ventral wall hernia repair LAPAROSCOPIC LYSIS OF ADHESIONS laparoscopic exploration of abdomen ;  Surgeon: Adin Hector, MD;  Location: [facility];  Service: General;  Laterality: N/A;  . URETER REVISION     Bilateral "twisted"  . UTERINE FIBROID SURGERY     2 SURGERIES FOR FIBROIDS  . VENTRAL HERNIA REPAIR  10/03/2011   Procedure: LAPAROSCOPIC VENTRAL HERNIA;  Surgeon: Adin Hector, MD;  Location: [facility];  Service: General;  Laterality: N/A;   Past Medical History:  Diagnosis Date  . Anemia    DURING MENSES--HAS HEAVY BLEEDING WITH PERODS  . Anxiety   . Back pain, chronic    "ongoing"  . Blood transfusion    IN 2012  AFTER C -SECTION  . Cerebral thrombosis with cerebral infarction [facility]) JUNE 2011   RIGHT SIDED WEAKNESS ( ARM AND LEG ) AND SPASMS-remains with slight weakness and vertigo.  . Constipation   . Depression   . Diabetes mellitus   . Diabetic neuropathy ([place])    BOTH FEET --COMES AND GOES  . Edema, lower extremity   .  Endometriosis   . Fatty liver   . H/O eye surgery   . Headache(784.0)    MIGRAINES--NOT REALLY HEADACHE-MORE LIKE PRESSURE SENSATION IN HEAD-FEELS DIZZIY AND  FAINT AS THE PRESSURE RESOLVES  . Hernia, incisional, RLQ, s/p lap repair Sep 2013 09/02/2011  . Hx of migraines 10/19/2011  . Hypertension   . Leg pain, right    "like bad Charley horse"  . Multiple food allergies   . Panic disorder  without agoraphobia   . Rash    HANDS, ARMS --STATES HX OF RASH EVER SINCE CHILDBIRTH/PREGNANCY.  STATES THE RASH OFTEN OCCURS WHEN SHE IS REALLY STRESSED."goes and comes-presently left ring finger"  . Restless leg syndrome    DIAGNOSED BY SLEEP STUDY - PT TOLD SHE DID NOT HAVE SLEEP APNEA  . Right rotator cuff tear    PAIN IN RIGHT SHOULDER  . SBO (small bowel obstruction) ([place]) 06/03/2012  . Shortness of breath   . Spastic hemiplegia affecting dominant side ([place])   . Stomach problems   . Stroke ([place])   . Ventral hernia    RIGHT LOWER QUADRANT-CAUSING SOME PAIN  . Weakness of right side of body    BP 139/83   Pulse 74   Ht 5\' 7"  (1.702 m)   Wt 233 lb 12.8 oz (106.1 kg)   SpO2 98%   BMI 36.62 kg/m   Opioid Risk Score:   Fall Risk Score:  `1  Depression screen PHQ 2/9  Depression screen [facility] 2/9 01/01/2018 11/03/2017 01/23/2017 12/12/2016 03/08/2015  Decreased Interest 1 2 0 0 2  Down, Depressed, Hopeless 1 3 0 0 2  PHQ - 2 Score 2 5 0 0 4  Altered sleeping - 2 - - 3  Tired, decreased energy - 3 - - 3  Change in appetite - 3 - - 1  Feeling bad or failure about yourself  - 3 - - 2  Trouble concentrating - 1 - - 3  Moving slowly or fidgety/restless - 0 - - 2  Suicidal thoughts - 1 - - 1  PHQ-9 Score - 18 - - 19  Difficult doing work/chores - Somewhat difficult - - Somewhat difficult    Review of Systems  Constitutional: Positive for diaphoresis.  HENT: Negative.   Eyes: Negative.   Respiratory: Negative.   Cardiovascular: Negative.   Gastrointestinal: Positive for abdominal pain,  constipation, diarrhea and nausea.  Endocrine: Negative.   Genitourinary: Negative.   Musculoskeletal: Negative.   Skin: Negative.   Allergic/Immunologic: Negative.   Neurological: Negative.   Psychiatric/Behavioral: Negative.   All other systems reviewed and are negative.      Objective:   Physical Exam Vitals signs and nursing note reviewed.  Constitutional:      Appearance: Normal appearance. She is obese.  HENT:     Head: Normocephalic and atraumatic.     Right Ear: External ear normal.     Left Ear: External ear normal.     Nose: No congestion.     Mouth/Throat:     Mouth: Mucous membranes are moist.  Eyes:     Extraocular Movements: Extraocular movements intact.     Conjunctiva/sclera: Conjunctivae normal.     Pupils: Pupils are equal, round, and reactive to light.  Musculoskeletal:     Comments: Tenderness palpation left greater than right trapezius. The patient has good cervical spine range of motion as well as shoulder range of motion.  Negative impingement no tenderness over the deltoid AC joint or subacromial area There is tenderness palpation over bilateral PSIS area Negative straight leg raising   Skin:    General: Skin is warm and dry.  Neurological:     Mental Status: She is alert and oriented to person, place, and time.     Comments: Motor strength is 4/5 in the right deltoid bicep tricep grip hip flexor knee extensor ankle dorsiflexor left side is 5/5 and hip flexor knee extensor ankle dorsiflexor  Psychiatric:  Mood and Affect: Mood normal.        Behavior: Behavior normal.           Assessment & Plan:  #1.  Chronic right hemiparesis following pontine infarct.  She has gait imbalance as well as asymmetric gait which is causing her sacroiliac issues. She is favoring the right upper extremity due to weakness and is using the left upper extremity.  I believe she has myofascial pain in the left upper trapezius. We discussed exercises including  Thera-Band exercises to strengthen the scapular abductors. She is to call if she has no improvement for a trigger point injection left upper trapezius.  In terms of her low back pain this is more of a sacroiliac issue she has had some short-term relief of flareups after sacroiliac injection.  She is to call if she does have an acute exacerbation. Follow-up in 6 months Continue tramadol

## 2018-01-01 NOTE — Patient Instructions (Signed)
Shoulder Exercises Ask your health care provider which exercises are safe for you. Do exercises exactly as told by your health care provider and adjust them as directed. It is normal to feel mild stretching, pulling, tightness, or discomfort as you do these exercises, but you should stop right away if you feel sudden pain or your pain gets worse.Do not begin these exercises until told by your health care provider. Range of Motion Exercises        These exercises warm up your muscles and joints and improve the movement and flexibility of your shoulder. These exercises also help to relieve pain, numbness, and tingling. These exercises involve stretching your injured shoulder directly. Exercise A: Pendulum 1. Stand near a wall or a surface that you can hold onto for balance. 2. Bend at the waist and let your left / right arm hang straight down. Use your other arm to support you. Keep your back straight and do not lock your knees. 3. Relax your left / right arm and shoulder muscles, and move your hips and your trunk so your left / right arm swings freely. Your arm should swing because of the motion of your body, not because you are using your arm or shoulder muscles. 4. Keep moving your body so your arm swings in the following directions, as told by your health care provider: ? Side to side. ? Forward and backward. ? In clockwise and counterclockwise circles. 5. Continue each motion for __________ seconds, or for as long as told by your health care provider. 6. Slowly return to the starting position. Repeat __________ times. Complete this exercise __________ times a day. Exercise B:Flexion, Standing 1. Stand and hold a broomstick, a cane, or a similar object. Place your hands a little more than shoulder-width apart on the object. Your left / right hand should be palm-up, and your other hand should be palm-down. 2. Keep your elbow straight and keep your shoulder muscles relaxed. Push the stick  down with your healthy arm to raise your left / right arm in front of your body, and then over your head until you feel a stretch in your shoulder. ? Avoid shrugging your shoulder while you raise your arm. Keep your shoulder blade tucked down toward the middle of your back. 3. Hold for __________ seconds. 4. Slowly return to the starting position. Repeat __________ times. Complete this exercise __________ times a day. Exercise C: Abduction, Standing 1. Stand and hold a broomstick, a cane, or a similar object. Place your hands a little more than shoulder-width apart on the object. Your left / right hand should be palm-up, and your other hand should be palm-down. 2. While keeping your elbow straight and your shoulder muscles relaxed, push the stick across your body toward your left / right side. Raise your left / right arm to the side of your body and then over your head until you feel a stretch in your shoulder. ? Do not raise your arm above shoulder height, unless your health care provider tells you to do that. ? Avoid shrugging your shoulder while you raise your arm. Keep your shoulder blade tucked down toward the middle of your back. 3. Hold for __________ seconds. 4. Slowly return to the starting position. Repeat __________ times. Complete this exercise __________ times a day. Exercise D:Internal Rotation 1. Place your left / right hand behind your back, palm-up. 2. Use your other hand to dangle an exercise band, a towel, or a similar object over your shoulder.   Grasp the band with your left / right hand so you are holding onto both ends. 3. Gently pull up on the band until you feel a stretch in the front of your left / right shoulder. ? Avoid shrugging your shoulder while you raise your arm. Keep your shoulder blade tucked down toward the middle of your back. 4. Hold for __________ seconds. 5. Release the stretch by letting go of the band and lowering your hands. Repeat __________ times.  Complete this exercise __________ times a day. Stretching Exercises  These exercises warm up your muscles and joints and improve the movement and flexibility of your shoulder. These exercises also help to relieve pain, numbness, and tingling. These exercises are done using your healthy shoulder to help stretch the muscles of your injured shoulder. Exercise E: Corner Stretch (External Rotation and Abduction) 1. Stand in a doorway with one of your feet slightly in front of the other. This is called a staggered stance. If you cannot reach your forearms to the door frame, stand facing a corner of a room. 2. Choose one of the following positions as told by your health care provider: ? Place your hands and forearms on the door frame above your head. ? Place your hands and forearms on the door frame at the height of your head. ? Place your hands on the door frame at the height of your elbows. 3. Slowly move your weight onto your front foot until you feel a stretch across your chest and in the front of your shoulders. Keep your head and chest upright and keep your abdominal muscles tight. 4. Hold for __________ seconds. 5. To release the stretch, shift your weight to your back foot. Repeat __________ times. Complete this stretch __________ times a day. Exercise F:Extension, Standing 1. Stand and hold a broomstick, a cane, or a similar object behind your back. ? Your hands should be a little wider than shoulder-width apart. ? Your palms should face away from your back. 2. Keeping your elbows straight and keeping your shoulder muscles relaxed, move the stick away from your body until you feel a stretch in your shoulder. ? Avoid shrugging your shoulders while you move the stick. Keep your shoulder blade tucked down toward the middle of your back. 3. Hold for __________ seconds. 4. Slowly return to the starting position. Repeat __________ times. Complete this exercise __________ times a  day. Strengthening Exercises           These exercises build strength and endurance in your shoulder. Endurance is the ability to use your muscles for a long time, even after they get tired. Exercise G:External Rotation 1. Sit in a stable chair without armrests. 2. Secure an exercise band at elbow height on your left / right side. 3. Place a soft object, such as a folded towel or a small pillow, between your left / right upper arm and your body to move your elbow a few inches away (about 10 cm) from your side. 4. Hold the end of the band so it is tight and there is no slack. 5. Keeping your elbow pressed against the soft object, move your left / right forearm out, away from your abdomen. Keep your body steady so only your forearm moves. 6. Hold for __________ seconds. 7. Slowly return to the starting position. Repeat __________ times. Complete this exercise __________ times a day. Exercise H:Shoulder Abduction 1. Sit in a stable chair without armrests, or stand. 2. Hold a __________ weight in your   left / right hand, or hold an exercise band with both hands. 3. Start with your arms straight down and your left / right palm facing in, toward your body. 4. Slowly lift your left / right hand out to your side. Do not lift your hand above shoulder height unless your health care provider tells you that this is safe. ? Keep your arms straight. ? Avoid shrugging your shoulder while you do this movement. Keep your shoulder blade tucked down toward the middle of your back. 5. Hold for __________ seconds. 6. Slowly lower your arm, and return to the starting position. Repeat __________ times. Complete this exercise __________ times a day. Exercise I:Shoulder Extension 1. Sit in a stable chair without armrests, or stand. 2. Secure an exercise band to a stable object in front of you where it is at shoulder height. 3. Hold one end of the exercise band in each hand. Your palms should face each  other. 4. Straighten your elbows and lift your hands up to shoulder height. 5. Step back, away from the secured end of the exercise band, until the band is tight and there is no slack. 6. Squeeze your shoulder blades together as you pull your hands down to the sides of your thighs. Stop when your hands are straight down by your sides. Do not let your hands go behind your body. 7. Hold for __________ seconds. 8. Slowly return to the starting position. Repeat __________ times. Complete this exercise __________ times a day. Exercise J:Standing Shoulder Row 1. Sit in a stable chair without armrests, or stand. 2. Secure an exercise band to a stable object in front of you so it is at waist height. 3. Hold one end of the exercise band in each hand. Your palms should be in a thumbs-up position. 4. Bend each of your elbows to an "L" shape (about 90 degrees) and keep your upper arms at your sides. 5. Step back until the band is tight and there is no slack. 6. Slowly pull your elbows back behind you. 7. Hold for __________ seconds. 8. Slowly return to the starting position. Repeat __________ times. Complete this exercise __________ times a day. Exercise K:Shoulder Press-Ups 1. Sit in a stable chair that has armrests. Sit upright, with your feet flat on the floor. 2. Put your hands on the armrests so your elbows are bent and your fingers are pointing forward. Your hands should be about even with the sides of your body. 3. Push down on the armrests and use your arms to lift yourself off of the chair. Straighten your elbows and lift yourself up as much as you comfortably can. ? Move your shoulder blades down, and avoid letting your shoulders move up toward your ears. ? Keep your feet on the ground. As you get stronger, your feet should support less of your body weight as you lift yourself up. 4. Hold for __________ seconds. 5. Slowly lower yourself back into the chair. Repeat __________ times. Complete  this exercise __________ times a day. Exercise L: Wall Push-Ups 1. Stand so you are facing a stable wall. Your feet should be about one arm-length away from the wall. 2. Lean forward and place your palms on the wall at shoulder height. 3. Keep your feet flat on the floor as you bend your elbows and lean forward toward the wall. 4. Hold for __________ seconds. 5. Straighten your elbows to push yourself back to the starting position. Repeat __________ times. Complete this exercise __________ times   a day. This information is not intended to replace advice given to you by your health care provider. Make sure you discuss any questions you have with your health care provider. Document Released: 11/13/2004 Document Revised: 05/05/2017 Document Reviewed: 09/10/2014 [employer] Interactive Patient Education  2019 [employer]  

## 2018-01-21 ENCOUNTER — Encounter (INDEPENDENT_AMBULATORY_CARE_PROVIDER_SITE_OTHER): Payer: Self-pay | Admitting: Family Medicine

## 2018-01-21 ENCOUNTER — Ambulatory Visit (INDEPENDENT_AMBULATORY_CARE_PROVIDER_SITE_OTHER): Payer: HMO | Admitting: Family Medicine

## 2018-01-21 VITALS — BP 113/77 | HR 78 | Temp 97.4°F | Ht 67.0 in | Wt 230.0 lb

## 2018-01-21 DIAGNOSIS — E1140 Type 2 diabetes mellitus with diabetic neuropathy, unspecified: Secondary | ICD-10-CM | POA: Diagnosis not present

## 2018-01-21 DIAGNOSIS — R531 Weakness: Secondary | ICD-10-CM | POA: Diagnosis not present

## 2018-01-21 DIAGNOSIS — E118 Type 2 diabetes mellitus with unspecified complications: Secondary | ICD-10-CM | POA: Diagnosis not present

## 2018-01-21 DIAGNOSIS — E559 Vitamin D deficiency, unspecified: Secondary | ICD-10-CM

## 2018-01-21 DIAGNOSIS — E876 Hypokalemia: Secondary | ICD-10-CM | POA: Diagnosis not present

## 2018-01-21 DIAGNOSIS — Z9189 Other specified personal risk factors, not elsewhere classified: Secondary | ICD-10-CM | POA: Diagnosis not present

## 2018-01-21 DIAGNOSIS — I639 Cerebral infarction, unspecified: Secondary | ICD-10-CM | POA: Diagnosis not present

## 2018-01-21 DIAGNOSIS — Z6836 Body mass index (BMI) 36.0-36.9, adult: Secondary | ICD-10-CM | POA: Diagnosis not present

## 2018-01-21 DIAGNOSIS — I10 Essential (primary) hypertension: Secondary | ICD-10-CM | POA: Diagnosis not present

## 2018-01-21 DIAGNOSIS — Z794 Long term (current) use of insulin: Secondary | ICD-10-CM | POA: Diagnosis not present

## 2018-01-21 DIAGNOSIS — N390 Urinary tract infection, site not specified: Secondary | ICD-10-CM | POA: Diagnosis not present

## 2018-01-21 DIAGNOSIS — R109 Unspecified abdominal pain: Secondary | ICD-10-CM | POA: Diagnosis not present

## 2018-01-21 DIAGNOSIS — E7800 Pure hypercholesterolemia, unspecified: Secondary | ICD-10-CM | POA: Diagnosis not present

## 2018-01-21 MED ORDER — VITAMIN D (ERGOCALCIFEROL) 1.25 MG (50000 UNIT) PO CAPS: 50000.0000 [IU] | ORAL_CAPSULE | ORAL | 0 refills | Status: DC

## 2018-01-25 NOTE — Progress Notes (Signed)
Office: [phone]  /  Fax: [phone]   HPI:   Chief Complaint: OBESITY Shaylon is here to discuss her progress with her obesity treatment plan. She is on the keep a food journal with 450 to 600 calories and 40+ grams of protein at supper daily and the Category 3 plan with breakfast options and is following her eating plan approximately 50 % of the time. She states she is exercising 0 minutes 0 times per week. Marianela has been skipping meal because she is not feeling well. Her weight is 230 lb (104.3 kg) today and has had a weight gain of 2 pounds over a period of 3 weeks since her last visit. She has lost 8 lbs since starting treatment with [name].  Vitamin D deficiency [name] has a diagnosis of vitamin D deficiency. She is not at goal and her last vitamin D level was at 10.3 on 11/03/17. Eviana is currently taking vit D and she admits nausea. She denies  vomiting or muscle weakness.  At risk for osteopenia and osteoporosis Keilly is at higher risk of osteopenia and osteoporosis due to vitamin D deficiency.   Diabetes II with neuropathy on insulin Akari has a diagnosis of diabetes type II. She is not well controlled. Her last A1c was at 8.4. She sees her endocrinologist today. Lailah states fasting BGs range betwen 69 and 135 and 2 hour post prandial BGs range between 144 and 203. She has had increased pain, which has worsened her CBGs. She has been working on intensive lifestyle modifications including diet, exercise, and weight loss to help control her blood glucose levels.  Right sided weakness Dwayna has residual right sided weakness from a stroke, but she has noticed increased right sided weakness over the last three weeks. She admits to lightheadedness, nausea and visual disturbances.  ASSESSMENT AND PLAN:  Vitamin D deficiency - Plan: Vitamin D, Ergocalciferol, (DRISDOL) 1.25 MG (50000 UT) CAPS capsule  Type 2 diabetes mellitus with diabetic neuropathy, with long-term  current use of insulin (HCC)  Right sided weakness  At risk for osteoporosis  Class 2 severe obesity with serious comorbidity and body mass index (BMI) of 36.0 to 36.9 in adult, unspecified obesity type ([place])  PLAN:  Vitamin D Deficiency Janalynn was informed that low vitamin D levels contributes to fatigue and are associated with obesity, breast, and colon cancer. She agrees to continue to take prescription Vit D @50 ,000 IU every week and will follow up for routine testing of vitamin D, at least 2-3 times per year. She was informed of the risk of over-replacement of vitamin D and agrees to not increase her dose unless she discusses this with [name] first.  At risk for osteopenia and osteoporosis Stevey was given extended  (15 minutes) osteoporosis prevention counseling today. Roann is at risk for osteopenia and osteoporosis due to her vitamin D deficiency. She was encouraged to take her vitamin D and follow her higher calcium diet and increase strengthening exercise to help strengthen her bones and decrease her risk of osteopenia and osteoporosis.  Diabetes II with neuropathy on insulin Nalaya has been given extensive diabetes education by myself today including ideal fasting and post-prandial blood glucose readings, individual ideal Hgb A1c goals  and hypoglycemia prevention. We discussed the importance of good blood sugar control to decrease the likelihood of diabetic complications such as nephropathy, neuropathy, limb loss, blindness, coronary artery disease, and death. We discussed the importance of intensive lifestyle modification including diet, exercise and weight loss as the  first line treatment for diabetes. Dorthy will see her endocrinologist today and  continue her diabetes medications. She will eat more and will follow up at the agreed upon time.  Right sided weakness Aleaha is to call her neurologist to make an appointment ASAP. I advised of the importance of having these symptoms  evaluated in consideration of her history of stroke. She will go to the emergency department for worsening headache or weakness. I advised her that she needs to be seen today or tomorrow.  Naiomy agrees to call her neurologist's office today.   Obesity Ludia is currently in the action stage of change. As such, her goal is to continue with weight loss efforts She has agreed to follow the Category 3 plan with breakfast options We discussed the following Behavioral Modification Strategies today: no skipping meals and planning for success has not been prescribed exercise at this time. Glenna has agreed to follow up with our clinic in 2 weeks. She was informed of the importance of frequent follow up visits to maximize her success with intensive lifestyle modifications for her multiple health conditions.  ALLERGIES: Allergies  Allergen Reactions  . Contrast Media [Iodinated Diagnostic Agents] Other (See Comments)    Difficulty breathing  . Iohexol Hives, Nausea And Vomiting and Swelling     Desc: Magnevist-gadolinium-difficulty breathing, throat swelling   . Midazolam Hcl Anaphylaxis    Difficulty breathing  . Shellfish Allergy Anaphylaxis  . Valsartan Swelling  . Metformin And Related Diarrhea and Nausea And Vomiting  . Other Itching    Patient is allergic to all nuts except peanuts.   . Avandia [Rosiglitazone Maleate] Hives and Other (See Comments)  . Geodon [Ziprasidone] Other (See Comments)    UNKNOWN  . Kiwi Extract Itching and Swelling  . Latex Itching    MEDICATIONS: Current Outpatient Medications on File Prior to Visit  Medication Sig Dispense Refill  . allopurinol (ZYLOPRIM) 100 MG tablet Take 100 mg by mouth every evening.    Marland Kitchen amLODipine (NORVASC) 10 MG tablet Take 1 tablet (10 mg total) by mouth daily. 30 tablet 0  . aspirin 325 MG tablet Take 325 mg by mouth at bedtime.     . cetirizine (ZYRTEC) 10 MG tablet Take 10 mg by mouth daily as needed.  11  . cloNIDine  (CATAPRES) 0.1 MG tablet Take 0.1 mg by mouth 2 (two) times daily.  0  . colchicine 0.6 MG tablet Take 0.6 mg by mouth daily as needed (For gout flare-up.).   1  . diphenhydrAMINE (BENADRYL) 25 MG tablet Take 1 tablet (25 mg total) by mouth every 6 (six) hours. 12 tablet 0  . EPINEPHrine 0.3 mg/0.3 mL IJ SOAJ injection INJECT AS DIRECTED AS NEEDED FOR ALLERGIC REACTION 1 Device 2  . furosemide (LASIX) 40 MG tablet Take 40 mg by mouth as needed.    . gabapentin (NEURONTIN) 600 MG tablet Take 600-1,200 mg by mouth See admin instructions. Take 1 tablets every morning, 1 tablet at noon and 2 tablets every evening     . guanFACINE (TENEX) 1 MG tablet Take 1 mg by mouth at bedtime.    . hydrochlorothiazide (HYDRODIURIL) 50 MG tablet Take 50 mg by mouth daily.     . insulin aspart (NOVOLOG) 100 UNIT/ML injection Inject 5-25 Units into the skin 3 (three) times daily with meals. Per sliding scale--pt uses Omnipod and gets a basal rate    . Insulin Glargine (BASAGLAR KWIKPEN) 100 UNIT/ML SOPN Inject into the skin daily.    Marland Kitchen  Liraglutide (VICTOZA) 18 MG/3ML SOPN Inject 0.6 mg into the skin daily.     Marland Kitchen MACA ROOT PO Take 950 mg by mouth 2 (two) times daily. [employer] - organic Maca Root Black, Red, Yellow 950MG  capsule    . mometasone (ELOCON) 0.1 % cream 1-2 times per day 45 g 1  . montelukast (SINGULAIR) 10 MG tablet Take 1 tablet (10 mg total) by mouth at bedtime. 30 tablet 5  . naproxen (NAPROSYN) 500 MG tablet Take 1 tablet (500 mg total) by mouth 2 (two) times daily. 14 tablet 0  . OVER THE COUNTER MEDICATION Take 1 tablet by mouth 2 (two) times daily. hylands - bioplasma    . propranolol (INDERAL) 20 MG tablet Take 20 mg by mouth 3 (three) times daily.    Marland Kitchen spironolactone (ALDACTONE) 25 MG tablet Take 25 mg by mouth every morning.  1  . tiZANidine (ZANAFLEX) 2 MG tablet 1 po qhs, 90 d supply 90 tablet 1  . traMADol (ULTRAM) 50 MG tablet TAKE 1 TABLET BY MOUTH TWICE A DAY. 90 tablet 5   No  current facility-administered medications on file prior to visit.     PAST MEDICAL HISTORY: Past Medical History:  Diagnosis Date  . Anemia    DURING MENSES--HAS HEAVY BLEEDING WITH PERODS  . Anxiety   . Back pain, chronic    "ongoing"  . Blood transfusion    IN 2012  AFTER C -SECTION  . Cerebral thrombosis with cerebral infarction [facility]) JUNE 2011   RIGHT SIDED WEAKNESS ( ARM AND LEG ) AND SPASMS-remains with slight weakness and vertigo.  . Constipation   . Depression   . Diabetes mellitus   . Diabetic neuropathy ([place])    BOTH FEET --COMES AND GOES  . Edema, lower extremity   . Endometriosis   . Fatty liver   . H/O eye surgery   . Headache(784.0)    MIGRAINES--NOT REALLY HEADACHE-MORE LIKE PRESSURE SENSATION IN HEAD-FEELS DIZZIY AND  FAINT AS THE PRESSURE RESOLVES  . Hernia, incisional, RLQ, s/p lap repair Sep 2013 09/02/2011  . Hx of migraines 10/19/2011  . Hypertension   . Leg pain, right    "like bad Charley horse"  . Multiple food allergies   . Panic disorder without agoraphobia   . Rash    HANDS, ARMS --STATES HX OF RASH EVER SINCE CHILDBIRTH/PREGNANCY.  STATES THE RASH OFTEN OCCURS WHEN SHE IS REALLY STRESSED."goes and comes-presently left ring finger"  . Restless leg syndrome    DIAGNOSED BY SLEEP STUDY - PT TOLD SHE DID NOT HAVE SLEEP APNEA  . Right rotator cuff tear    PAIN IN RIGHT SHOULDER  . SBO (small bowel obstruction) ([place]) 06/03/2012  . Shortness of breath   . Spastic hemiplegia affecting dominant side ([place])   . Stomach problems   . Stroke ([place])   . Ventral hernia    RIGHT LOWER QUADRANT-CAUSING SOME PAIN  . Weakness of right side of body     PAST SURGICAL HISTORY: Past Surgical History:  Procedure Laterality Date  . APPLICATION OF WOUND VAC N/A 05/25/2015   Procedure: APPLICATION OF WOUND VAC;  Surgeon: Michael Boston, MD;  Location: [facility];  Service: General;  Laterality: N/A;  . CESAREAN SECTION  2012  . DIAGNOSTIC LAPAROSCOPY    .  ESOPHAGOGASTRODUODENOSCOPY N/A 06/03/2012   Procedure: ESOPHAGOGASTRODUODENOSCOPY (EGD);  Surgeon: [name] Craver, MD;  Location[facility] ENDOSCOPY;  Service: Endoscopy;  Laterality: N/A;  . EXCISION MASS ABDOMINAL N/A 05/25/2015  Procedure: ABDOMINAL WALL EXPLORATION EXCISION OF SEROMA REMOVAL OF REDUNDANT SKIN ;  Surgeon: Michael Boston, MD;  Location: [facility];  Service: General;  Laterality: N/A;  . EYE SURGERY     Eye laser for vessel hemorrhaging  . fybroid removal    . HERNIA REPAIR  10/03/11   ventral hernia repair  . INSERTION OF MESH N/A 02/04/2013   Procedure: INSERTION OF MESH;  Surgeon: Adin Hector, MD;  Location: [facility];  Service: General;  Laterality: N/A;  . UMBILICAL HERNIA REPAIR N/A 02/04/2013   Procedure: LAPAROSCOPIC ventral wall hernia repair LAPAROSCOPIC LYSIS OF ADHESIONS laparoscopic exploration of abdomen ;  Surgeon: Adin Hector, MD;  Location: [facility];  Service: General;  Laterality: N/A;  . URETER REVISION     Bilateral "twisted"  . UTERINE FIBROID SURGERY     2 SURGERIES FOR FIBROIDS  . VENTRAL HERNIA REPAIR  10/03/2011   Procedure: LAPAROSCOPIC VENTRAL HERNIA;  Surgeon: Adin Hector, MD;  Location: [facility];  Service: General;  Laterality: N/A;    SOCIAL HISTORY: Social History   Tobacco Use  . Smoking status: Never Smoker  . Smokeless tobacco: Never Used  Substance Use Topics  . Alcohol use: No    Alcohol/week: 0.0 standard drinks  . Drug use: No    FAMILY HISTORY: Family History  Problem Relation Age of Onset  . Diabetes Father   . Kidney disease Father   . Depression Father   . Drug abuse Father   . Allergic rhinitis Mother   . Eczema Mother   . Urticaria Mother   . Depression Mother   . Anxiety disorder Mother   . Bipolar disorder Mother   . Alcoholism Mother   . Drug abuse Mother   . Eating disorder Mother   . Diabetes Maternal Grandmother   . Hyperlipidemia Paternal Grandmother   . Stroke Paternal Grandmother   . Eczema Sister   .  Urticaria Sister   . Colon cancer Paternal Uncle   . Other Neg Hx   . Angioedema Neg Hx   . Asthma Neg Hx     ROS: Review of Systems  Constitutional: Negative for weight loss.  Eyes:       Positive for visual disturbances  Gastrointestinal: Positive for nausea. Negative for vomiting.  Musculoskeletal:       Negative for muscle weakness  Neurological: Positive for weakness (right sided).       Positive for lightheadedness  Endo/Heme/Allergies:       + hypoglycemia    PHYSICAL EXAM: Blood pressure 113/77, pulse 78, temperature (!) 97.4 F (36.3 C), temperature source Oral, height 5\' 7"  (1.702 m), weight 230 lb (104.3 kg), SpO2 99 %. Body mass index is 36.02 kg/m. Physical Exam Vitals signs reviewed.  Constitutional:      Appearance: Normal appearance. She is well-developed. She is obese.  Cardiovascular:     Rate and Rhythm: Normal rate.  Pulmonary:     Effort: Pulmonary effort is normal.  Musculoskeletal: Normal range of motion.  Skin:    General: Skin is warm and dry.  Neurological:     Mental Status: She is alert and oriented to person, place, and time.  Psychiatric:        Mood and Affect: Mood normal.        Behavior: Behavior normal.     RECENT LABS AND TESTS: BMET    Component Value Date/Time   NA 141 11/03/2017 1310   K 3.7 11/03/2017 1310  CL 100 11/03/2017 1310   CO2 24 11/03/2017 1310   GLUCOSE 128 (H) 11/03/2017 1310   GLUCOSE 141 (H) 07/05/2017 1437   BUN 11 11/03/2017 1310   CREATININE 0.70 11/03/2017 1310   CALCIUM 9.1 11/03/2017 1310   GFRNONAA 103 11/03/2017 1310   GFRAA 118 11/03/2017 1310   Lab Results  Component Value Date   HGBA1C 8.4 (H) 11/03/2017   HGBA1C 7.5 (H) 05/18/2015   HGBA1C 8.7 (H) 02/05/2013   HGBA1C 6.9 (H) 06/03/2012   HGBA1C 8.3 (H) 10/17/2011   No results found for: INSULIN CBC    Component Value Date/Time   WBC 6.1 07/05/2017 1437   RBC 4.69 07/05/2017 1437   HGB 12.1 07/05/2017 1437   HCT 38.4  07/05/2017 1437   PLT 317 07/05/2017 1437   MCV 81.9 07/05/2017 1437   MCH 25.8 (L) 07/05/2017 1437   MCHC 31.5 07/05/2017 1437   RDW 13.9 07/05/2017 1437   LYMPHSABS 1.3 05/11/2017 1323   MONOABS 0.6 05/11/2017 1323   EOSABS 0.2 05/11/2017 1323   BASOSABS 0.0 05/11/2017 1323   Iron/TIBC/Ferritin/ %Sat    Component Value Date/Time   IRON 114 11/01/2015 1031   TIBC 372 11/01/2015 1031   FERRITIN 35 11/01/2015 1031   IRONPCTSAT 31 11/01/2015 1031   Lipid Panel     Component Value Date/Time   CHOL 230 (H) 11/03/2017 1310   TRIG 211 (H) 11/03/2017 1310   HDL 50 11/03/2017 1310   CHOLHDL 6.0 10/17/2011 0315   VLDL 59 (H) 10/17/2011 0315   LDLCALC 138 (H) 11/03/2017 1310   Hepatic Function Panel     Component Value Date/Time   PROT 6.8 11/03/2017 1310   ALBUMIN 3.8 11/03/2017 1310   AST 14 11/03/2017 1310   ALT 13 11/03/2017 1310   ALKPHOS 54 11/03/2017 1310   BILITOT 0.3 11/03/2017 1310   BILIDIR <0.1 06/03/2012 0220   IBILI NOT CALCULATED 06/03/2012 0220      Component Value Date/Time   TSH 0.947 11/03/2017 1310   TSH 2.020 09/08/2017 1129   TSH 1.045 06/03/2012 0520    Ref. Range 11/03/2017 13:10  Vitamin D, 25-Hydroxy Latest Ref Range: 30.0 - 100.0 ng/mL 10.3 (L)     OBESITY BEHAVIORAL INTERVENTION VISIT  Today's visit was # 6   Starting weight: 238 lbs Starting date: 11/03/2017 Today's weight : 230 lbs  Today's date: 01/21/2018 Total lbs lost to date: 8   ASK: We discussed the diagnosis of obesity with Idalis Carmelia Roller today and Aleene agreed to give [name] permission to discuss obesity behavioral modification therapy today.  ASSESS: Gaylene has the diagnosis of obesity and her BMI today is 36.01 Izabelle is in the action stage of change   ADVISE: Caresse was educated on the multiple health risks of obesity as well as the benefit of weight loss to improve her health. She was advised of the need for long term treatment and the importance of lifestyle  modifications to improve her current health and to decrease her risk of future health problems.  AGREE: Multiple dietary modification options and treatment options were discussed and  Anavictoria agreed to follow the recommendations documented in the above note.  ARRANGE: Avelina was educated on the importance of frequent visits to treat obesity as outlined per CMS and USPSTF guidelines and agreed to schedule her next follow up appointment today.  Corey Skains, am acting as Location manager for [employer], FNP-C.  I have reviewed the above documentation for accuracy and completeness,  and I agree with the above.  - Shterna Laramee, FNP-C.

## 2018-01-27 ENCOUNTER — Other Ambulatory Visit: Payer: Self-pay

## 2018-01-27 DIAGNOSIS — E118 Type 2 diabetes mellitus with unspecified complications: Secondary | ICD-10-CM | POA: Diagnosis not present

## 2018-01-27 DIAGNOSIS — Z794 Long term (current) use of insulin: Secondary | ICD-10-CM | POA: Diagnosis not present

## 2018-01-27 DIAGNOSIS — E1140 Type 2 diabetes mellitus with diabetic neuropathy, unspecified: Secondary | ICD-10-CM | POA: Diagnosis not present

## 2018-01-27 NOTE — Patient Outreach (Signed)
  [facility]) Care Management Chronic Special Needs Program  01/27/2018  Name: Tina [name] DOB: 1970/09/02  MRN: [id number]  Tina [name] is enrolled in a chronic special needs plan for Diabetes. Client states that she can not talk at this time and agrees to call tomorrow. Chronic care management coordinator will attempt call tomorrow 01/28/2018.   Peter Garter RN, Jackquline Denmark, CDE Chronic Care Management Coordinator [facility] Care Management [phone]

## 2018-01-28 ENCOUNTER — Other Ambulatory Visit: Payer: Self-pay

## 2018-01-28 DIAGNOSIS — F331 Major depressive disorder, recurrent, moderate: Secondary | ICD-10-CM | POA: Diagnosis not present

## 2018-01-28 DIAGNOSIS — F4011 Social phobia, generalized: Secondary | ICD-10-CM | POA: Diagnosis not present

## 2018-01-28 NOTE — Addendum Note (Signed)
Addended by: Dimitri Ped on: 01/28/2018 03:23 PM   Modules accepted: Orders

## 2018-01-28 NOTE — Patient Outreach (Signed)
[facility]) Care Management Chronic Special Needs Program  01/28/2018  Name: Tina [name] DOB: 10-Dec-1970  MRN: [id number]  [name] is enrolled in a chronic special needs plan for Diabetes. Chronic Care Management Coordinator telephoned client to review health risk assessment and to develop individualized care plan.  Introduced the chronic care management program, importance of client participation, and taking their care plan to all provider appointments and inpatient facilities.  Reviewed the transition of care process and possible referral to community care management.  Subjective: I want to control my diabetes better and lose more weight.  I was on the Omnipod pump but I could not afford it as my insurance did not cover it.  I would like to use again because my sugars were better then.  I had some falls when I had a bladder infection which made me weak but I feel more steady as it is getting better.  Goals Addressed            This Visit's Progress   .  Acknowledge receipt of Programme researcher, broadcasting/film/video      .  Diabetes Patient stated goal to lose 10 lbs in the next 3 months (pt-stated)       Continue to follow low carbohydrate diet as advised from the [facility] Healthy Weight and Wellness clinic Call [place] at [phone] or go to [email or web address] to get [employer] ID number    . Advanced Care Planning complete by the next 6 months       The social worker from [employer] will call you to assist with Advanced Directives    . Client understands the importance of follow-up with providers by attending scheduled visits      . Client will not report change from baseline and no repeated symptoms of stroke with in the next 3 months       . Client will report abillity to obtain Medications within next 3 months       The pharmacist will call you to review your medications    . Client will report improved coping with diabets and  weight by  the next 6 months       Diabetes self management actions:  Glucose monitoring per provider recommendation  Eat Healthy  Check feet daily  Visit provider every 3-6 months as directed  Hbg A1C level every 3-6 months.  Eye Exam yearly    . Client will report no fall or injuries in the next 3 months.       Review Check for Safety brochure  Get up slowly    . Client will use Assistive Devices as needed and verbalize understanding of device use      . Client will verbalize knowledge of self management of Hypertension as evidences by BP reading of 140/90 or less; or as defined by provider      . HEMOGLOBIN A1C < 7.0      . Maintain timely refills of diabetic medication as prescribed within the year .      Marland Kitchen Obtain annual  Lipid Profile, LDL-C      . Obtain Annual Eye (retinal)  Exam       . Obtain Annual Foot Exam      . Obtain annual screen for micro albuminuria (urine) , nephropathy (kidney problems)      . Obtain Hemoglobin A1C at least 2 times per year      . Visit Primary Care Provider or Endocrinologist at  least 2 times per year        Client reports seeing endocrinologist last week and thinks her hemoglobin has gone down to 6.9% since she has been following the food plan from the [facility] Healthy Weight and Wellness clinic.  Reports CBGs ranging 70-130 fasting and 120-150 post prandial using her Freestyle Libre. Reports occasional hypoglycemia if does not eat as planned. Reports frequent falls due to rt sides  Weakness from stroke which was exacerbated  by a recent UTI.  Reports using cane and being more careful getting up.  Plan:  Send successful outreach letter with a copy of their individualized care plan, Send individual care plan to provider and Send educational material  Outreach to health plan concierge to determine benefit for Omnipod insulin pump  Chronic care management coordination will outreach in:  3 Months  Will refer client to:  Social Work and  [place] RN, [place], [place] Management [phone]

## 2018-01-30 ENCOUNTER — Encounter: Payer: Self-pay | Admitting: *Deleted

## 2018-01-30 NOTE — Telephone Encounter (Signed)
This encounter was created in error - please disregard.

## 2018-02-01 ENCOUNTER — Encounter (INDEPENDENT_AMBULATORY_CARE_PROVIDER_SITE_OTHER): Payer: Self-pay | Admitting: Family Medicine

## 2018-02-01 ENCOUNTER — Ambulatory Visit (INDEPENDENT_AMBULATORY_CARE_PROVIDER_SITE_OTHER): Payer: Federal, State, Local not specified - PPO | Admitting: Family Medicine

## 2018-02-01 VITALS — BP 132/81 | HR 84 | Temp 97.2°F | Ht 67.0 in | Wt 228.0 lb

## 2018-02-01 DIAGNOSIS — Z6835 Body mass index (BMI) 35.0-35.9, adult: Secondary | ICD-10-CM | POA: Diagnosis not present

## 2018-02-01 DIAGNOSIS — R531 Weakness: Secondary | ICD-10-CM | POA: Diagnosis not present

## 2018-02-01 DIAGNOSIS — Z9189 Other specified personal risk factors, not elsewhere classified: Secondary | ICD-10-CM | POA: Diagnosis not present

## 2018-02-01 DIAGNOSIS — E1159 Type 2 diabetes mellitus with other circulatory complications: Secondary | ICD-10-CM | POA: Diagnosis not present

## 2018-02-01 DIAGNOSIS — E559 Vitamin D deficiency, unspecified: Secondary | ICD-10-CM

## 2018-02-01 DIAGNOSIS — Z794 Long term (current) use of insulin: Secondary | ICD-10-CM

## 2018-02-01 MED ORDER — VITAMIN D (ERGOCALCIFEROL) 1.25 MG (50000 UNIT) PO CAPS: 50000.0000 [IU] | ORAL_CAPSULE | ORAL | 0 refills | Status: DC

## 2018-02-02 ENCOUNTER — Other Ambulatory Visit: Payer: Self-pay | Admitting: *Deleted

## 2018-02-02 ENCOUNTER — Other Ambulatory Visit: Payer: Self-pay | Admitting: Pharmacist

## 2018-02-02 NOTE — Progress Notes (Signed)
Office: [phone]  /  Fax: [phone]   HPI:   Chief Complaint: OBESITY Tina [name] is here to discuss her progress with her obesity treatment plan. She is on the Category 3 plan with breakfast options and is following her eating plan approximately 65-70% of the time. She states she is walking for 10 minutes 1 time per week. Tina [name] is not eating enough protein. She does not eat protein until about 2 pm most days.  Her weight is 228 lb (103.4 kg) today and has had a weight loss of 2 pounds over a period of 1 to 2 weeks since her last visit. She has lost 10 lbs since starting treatment with [name].  Right Side Weakness Tina [name] reports right sided weakness improved after treatment with antibiotic for recent UTI.  Diabetes II Tina [name] has a diagnosis of diabetes type II. Tina [name] reports A1c <7 when it was checked 2 weeks ago by her Endocrinologist. I am unable to sees notes in Epic. She states fasting BGs range 79 and 102, and 2 hour post prandial 109 and 326. She denies polyphagia. She has been working on intensive lifestyle modifications including diet, exercise, and weight loss to help control her blood glucose levels.  Vitamin D Deficiency Tina [name] has a diagnosis of vitamin D deficiency. She is currently taking prescription Vit D, but level is not at goal. Last Vit D level was very low at 10.3 on 11/03/17. She notes fatigue and denies nausea, vomiting or muscle weakness.  At risk for osteopenia and osteoporosis Tina [name] is at higher risk of osteopenia and osteoporosis due to vitamin D deficiency.   ALLERGIES: Allergies  Allergen Reactions  . Contrast Media [Iodinated Diagnostic Agents] Other (See Comments)    Difficulty breathing  . Iohexol Hives, Nausea And Vomiting and Swelling     Desc: Magnevist-gadolinium-difficulty breathing, throat swelling   . Midazolam Hcl Anaphylaxis    Difficulty breathing  . Shellfish Allergy Anaphylaxis  . Valsartan Swelling  . Metformin And Related  Diarrhea and Nausea And Vomiting  . Other Itching    Patient is allergic to all nuts except peanuts.   . Avandia [Rosiglitazone Maleate] Hives and Other (See Comments)  . Geodon [Ziprasidone] Other (See Comments)    UNKNOWN  . Kiwi Extract Itching and Swelling  . Latex Itching    MEDICATIONS: Current Outpatient Medications on File Prior to Visit  Medication Sig Dispense Refill  . allopurinol (ZYLOPRIM) 100 MG tablet Take 100 mg by mouth every evening.    Marland Kitchen amLODipine (NORVASC) 10 MG tablet Take 1 tablet (10 mg total) by mouth daily. 30 tablet 0  . aspirin 325 MG tablet Take 325 mg by mouth at bedtime.     . cetirizine (ZYRTEC) 10 MG tablet Take 10 mg by mouth daily as needed.  11  . cloNIDine (CATAPRES) 0.1 MG tablet Take 0.1 mg by mouth 2 (two) times daily.  0  . colchicine 0.6 MG tablet Take 0.6 mg by mouth daily as needed (For gout flare-up.).   1  . diphenhydrAMINE (BENADRYL) 25 MG tablet Take 1 tablet (25 mg total) by mouth every 6 (six) hours. 12 tablet 0  . EPINEPHrine 0.3 mg/0.3 mL IJ SOAJ injection INJECT AS DIRECTED AS NEEDED FOR ALLERGIC REACTION 1 Device 2  . furosemide (LASIX) 40 MG tablet Take 40 mg by mouth as needed.    . gabapentin (NEURONTIN) 600 MG tablet Take 600-1,200 mg by mouth See admin instructions. Take 1 tablets every morning, 1 tablet at noon and  2 tablets every evening     . guanFACINE (TENEX) 1 MG tablet Take 1 mg by mouth at bedtime.    . hydrochlorothiazide (HYDRODIURIL) 50 MG tablet Take 50 mg by mouth daily.     . insulin aspart (NOVOLOG) 100 UNIT/ML injection Inject 5-25 Units into the skin 3 (three) times daily with meals. Per sliding scale--pt uses Omnipod and gets a basal rate    . Insulin Glargine (BASAGLAR KWIKPEN) 100 UNIT/ML SOPN Inject into the skin daily.    . Liraglutide (VICTOZA) 18 MG/3ML SOPN Inject 0.6 mg into the skin daily.     Marland Kitchen MACA ROOT PO Take 950 mg by mouth 2 (two) times daily. [employer] - organic Maca Root Black, Red, Yellow  950MG  capsule    . mometasone (ELOCON) 0.1 % cream 1-2 times per day 45 g 1  . montelukast (SINGULAIR) 10 MG tablet Take 1 tablet (10 mg total) by mouth at bedtime. 30 tablet 5  . naproxen (NAPROSYN) 500 MG tablet Take 1 tablet (500 mg total) by mouth 2 (two) times daily. 14 tablet 0  . OVER THE COUNTER MEDICATION Take 1 tablet by mouth 2 (two) times daily. hylands - bioplasma    . propranolol (INDERAL) 20 MG tablet Take 20 mg by mouth 3 (three) times daily.    Marland Kitchen spironolactone (ALDACTONE) 25 MG tablet Take 25 mg by mouth every morning.  1  . tiZANidine (ZANAFLEX) 2 MG tablet 1 po qhs, 90 d supply 90 tablet 1  . traMADol (ULTRAM) 50 MG tablet TAKE 1 TABLET BY MOUTH TWICE A DAY. 90 tablet 5   No current facility-administered medications on file prior to visit.     PAST MEDICAL HISTORY: Past Medical History:  Diagnosis Date  . Anemia    DURING MENSES--HAS HEAVY BLEEDING WITH PERODS  . Anxiety   . Back pain, chronic    "ongoing"  . Blood transfusion    IN 2012  AFTER C -SECTION  . Cerebral thrombosis with cerebral infarction [facility]) JUNE 2011   RIGHT SIDED WEAKNESS ( ARM AND LEG ) AND SPASMS-remains with slight weakness and vertigo.  . Constipation   . Depression   . Diabetes mellitus   . Diabetic neuropathy ([place])    BOTH FEET --COMES AND GOES  . Edema, lower extremity   . Endometriosis   . Fatty liver   . H/O eye surgery   . Headache(784.0)    MIGRAINES--NOT REALLY HEADACHE-MORE LIKE PRESSURE SENSATION IN HEAD-FEELS DIZZIY AND  FAINT AS THE PRESSURE RESOLVES  . Hernia, incisional, RLQ, s/p lap repair Sep 2013 09/02/2011  . Hx of migraines 10/19/2011  . Hypertension   . Leg pain, right    "like bad Charley horse"  . Multiple food allergies   . Panic disorder without agoraphobia   . Rash    HANDS, ARMS --STATES HX OF RASH EVER SINCE CHILDBIRTH/PREGNANCY.  STATES THE RASH OFTEN OCCURS WHEN SHE IS REALLY STRESSED."goes and comes-presently left ring finger"  . Restless leg syndrome     DIAGNOSED BY SLEEP STUDY - PT TOLD SHE DID NOT HAVE SLEEP APNEA  . Right rotator cuff tear    PAIN IN RIGHT SHOULDER  . SBO (small bowel obstruction) ([place]) 06/03/2012  . Shortness of breath   . Spastic hemiplegia affecting dominant side ([place])   . Stomach problems   . Stroke ([place])   . Ventral hernia    RIGHT LOWER QUADRANT-CAUSING SOME PAIN  . Weakness of right side of body  PAST SURGICAL HISTORY: Past Surgical History:  Procedure Laterality Date  . APPLICATION OF WOUND VAC N/A 05/25/2015   Procedure: APPLICATION OF WOUND VAC;  Surgeon: Michael Boston, MD;  Location: [facility];  Service: General;  Laterality: N/A;  . CESAREAN SECTION  2012  . DIAGNOSTIC LAPAROSCOPY    . ESOPHAGOGASTRODUODENOSCOPY N/A 06/03/2012   Procedure: ESOPHAGOGASTRODUODENOSCOPY (EGD);  Surgeon: [name] Craver, MD;  Location: [facility] ENDOSCOPY;  Service: Endoscopy;  Laterality: N/A;  . EXCISION MASS ABDOMINAL N/A 05/25/2015   Procedure: ABDOMINAL WALL EXPLORATION EXCISION OF SEROMA REMOVAL OF REDUNDANT SKIN ;  Surgeon: Michael Boston, MD;  Location: [facility];  Service: General;  Laterality: N/A;  . EYE SURGERY     Eye laser for vessel hemorrhaging  . fybroid removal    . HERNIA REPAIR  10/03/11   ventral hernia repair  . INSERTION OF MESH N/A 02/04/2013   Procedure: INSERTION OF MESH;  Surgeon: Adin Hector, MD;  Location: [facility];  Service: General;  Laterality: N/A;  . UMBILICAL HERNIA REPAIR N/A 02/04/2013   Procedure: LAPAROSCOPIC ventral wall hernia repair LAPAROSCOPIC LYSIS OF ADHESIONS laparoscopic exploration of abdomen ;  Surgeon: Adin Hector, MD;  Location: [facility];  Service: General;  Laterality: N/A;  . URETER REVISION     Bilateral "twisted"  . UTERINE FIBROID SURGERY     2 SURGERIES FOR FIBROIDS  . VENTRAL HERNIA REPAIR  10/03/2011   Procedure: LAPAROSCOPIC VENTRAL HERNIA;  Surgeon: Adin Hector, MD;  Location: [facility];  Service: General;  Laterality: N/A;    SOCIAL HISTORY: Social History   Tobacco  Use  . Smoking status: Never Smoker  . Smokeless tobacco: Never Used  Substance Use Topics  . Alcohol use: No    Alcohol/week: 0.0 standard drinks  . Drug use: No    FAMILY HISTORY: Family History  Problem Relation Age of Onset  . Diabetes Father   . Kidney disease Father   . Depression Father   . Drug abuse Father   . Allergic rhinitis Mother   . Eczema Mother   . Urticaria Mother   . Depression Mother   . Anxiety disorder Mother   . Bipolar disorder Mother   . Alcoholism Mother   . Drug abuse Mother   . Eating disorder Mother   . Diabetes Maternal Grandmother   . Hyperlipidemia Paternal Grandmother   . Stroke Paternal Grandmother   . Eczema Sister   . Urticaria Sister   . Colon cancer Paternal Uncle   . Other Neg Hx   . Angioedema Neg Hx   . Asthma Neg Hx     ROS: Review of Systems  Constitutional: Positive for malaise/fatigue and weight loss.  Gastrointestinal: Negative for nausea and vomiting.  Musculoskeletal:       Negative muscle weakness  Neurological: Positive for weakness (right side).  Endo/Heme/Allergies:       Negative polyphagia    PHYSICAL EXAM: Blood pressure 132/81, pulse 84, temperature (!) 97.2 F (36.2 C), temperature source Oral, height 5\' 7"  (1.702 m), weight 228 lb (103.4 kg), SpO2 99 %. Body mass index is 35.71 kg/m. Physical Exam Vitals signs reviewed.  Constitutional:      Appearance: Normal appearance. She is obese.  Cardiovascular:     Rate and Rhythm: Normal rate.     Pulses: Normal pulses.  Pulmonary:     Effort: Pulmonary effort is normal.     Breath sounds: Normal breath sounds.  Musculoskeletal: Normal range of motion.  Skin:  General: Skin is warm and dry.  Neurological:     Mental Status: She is alert and oriented to person, place, and time.  Psychiatric:        Mood and Affect: Mood normal.        Behavior: Behavior normal.     RECENT LABS AND TESTS: BMET    Component Value Date/Time   NA 141 11/03/2017  1310   K 3.7 11/03/2017 1310   CL 100 11/03/2017 1310   CO2 24 11/03/2017 1310   GLUCOSE 128 (H) 11/03/2017 1310   GLUCOSE 141 (H) 07/05/2017 1437   BUN 11 11/03/2017 1310   CREATININE 0.70 11/03/2017 1310   CALCIUM 9.1 11/03/2017 1310   GFRNONAA 103 11/03/2017 1310   GFRAA 118 11/03/2017 1310   Lab Results  Component Value Date   HGBA1C 8.4 (H) 11/03/2017   HGBA1C 7.5 (H) 05/18/2015   HGBA1C 8.7 (H) 02/05/2013   HGBA1C 6.9 (H) 06/03/2012   HGBA1C 8.3 (H) 10/17/2011   No results found for: INSULIN CBC    Component Value Date/Time   WBC 6.1 07/05/2017 1437   RBC 4.69 07/05/2017 1437   HGB 12.1 07/05/2017 1437   HCT 38.4 07/05/2017 1437   PLT 317 07/05/2017 1437   MCV 81.9 07/05/2017 1437   MCH 25.8 (L) 07/05/2017 1437   MCHC 31.5 07/05/2017 1437   RDW 13.9 07/05/2017 1437   LYMPHSABS 1.3 05/11/2017 1323   MONOABS 0.6 05/11/2017 1323   EOSABS 0.2 05/11/2017 1323   BASOSABS 0.0 05/11/2017 1323   Iron/TIBC/Ferritin/ %Sat    Component Value Date/Time   IRON 114 11/01/2015 1031   TIBC 372 11/01/2015 1031   FERRITIN 35 11/01/2015 1031   IRONPCTSAT 31 11/01/2015 1031   Lipid Panel     Component Value Date/Time   CHOL 230 (H) 11/03/2017 1310   TRIG 211 (H) 11/03/2017 1310   HDL 50 11/03/2017 1310   CHOLHDL 6.0 10/17/2011 0315   VLDL 59 (H) 10/17/2011 0315   LDLCALC 138 (H) 11/03/2017 1310   Hepatic Function Panel     Component Value Date/Time   PROT 6.8 11/03/2017 1310   ALBUMIN 3.8 11/03/2017 1310   AST 14 11/03/2017 1310   ALT 13 11/03/2017 1310   ALKPHOS 54 11/03/2017 1310   BILITOT 0.3 11/03/2017 1310   BILIDIR <0.1 06/03/2012 0220   IBILI NOT CALCULATED 06/03/2012 0220      Component Value Date/Time   TSH 0.947 11/03/2017 1310   TSH 2.020 09/08/2017 1129   TSH 1.045 06/03/2012 0520    ASSESSMENT AND PLAN: Right sided weakness  Type 2 diabetes mellitus without complication, with long-term current use of insulin (HCC)  Vitamin D deficiency -  Plan: Vitamin D, Ergocalciferol, (DRISDOL) 1.25 MG (50000 UT) CAPS capsule  At risk for osteoporosis  Class 2 severe obesity with serious comorbidity and body mass index (BMI) of 35.0 to 35.9 in adult, unspecified obesity type ([place])  PLAN:  Right Side Weakness She will continue to monitor for worsening right side weakness and follow up with neuro if this occurs.   Diabetes II Margert has been given extensive diabetes education by myself today including ideal fasting and post-prandial blood glucose readings, individual ideal Hgb A1c goals and hypoglycemia prevention. We discussed the importance of good blood sugar control to decrease the likelihood of diabetic complications such as nephropathy, neuropathy, limb loss, blindness, coronary artery disease, and death. We discussed the importance of intensive lifestyle modification including diet, exercise and weight loss as  the first line treatment for diabetes. Tina [name] agrees to continue her diabetes medications and continue to see her Endocrinologist. Tina [name] agrees to follow up with our clinic in 2 weeks.  Vitamin D Deficiency Tina [name] was informed that low vitamin D levels contributes to fatigue and are associated with obesity, breast, and colon cancer. Tina [name] agrees to continue taking prescription Vit D @50 ,000 IU every week #4 and we will refill for 1 month. She will follow up for routine testing of vitamin D, at least 2-3 times per year. She was informed of the risk of over-replacement of vitamin D and agrees to not increase her dose unless she discusses this with [name] first. Tina [name] agrees to follow up with our clinic in 2 weeks.  At risk for osteopenia and osteoporosis Tina [name] was given extended (15 minutes) osteoporosis prevention counseling today. Tina [name] is at risk for osteopenia and osteoporsis due to her vitamin D deficiency. She was encouraged to take her vitamin D and follow her higher calcium diet and increase strengthening exercise to  help strengthen her bones and decrease her risk of osteopenia and osteoporosis.  Obesity Tina [name] is currently in the action stage of change. As such, her goal is to continue with weight loss efforts She has agreed to follow the Category 3 plan with breakfast options Tina [name] will continue current exercise regimen for weight loss and overall health benefits. We discussed the following Behavioral Modification Strategies today: increasing lean protein intake and planning for success   Tina [name] has agreed to follow up with our clinic in 2 weeks. She was informed of the importance of frequent follow up visits to maximize her success with intensive lifestyle modifications for her multiple health conditions.   OBESITY BEHAVIORAL INTERVENTION VISIT  Today's visit was # 7  Starting weight: 238 lbs Starting date: 11/03/17 Today's weight : 228 lbs  Today's date: 02/01/2018 Total lbs lost to date: 10    ASK: We discussed the diagnosis of obesity with Tina [name] Tina [name] Roller today and Tina [name] agreed to give [name] permission to discuss obesity behavioral modification therapy today.  ASSESS: Tina [name] has the diagnosis of obesity and her BMI today is 35.7 Tina [name] is in the action stage of change   ADVISE: Tina [name] was educated on the multiple health risks of obesity as well as the benefit of weight loss to improve her health. She was advised of the need for long term treatment and the importance of lifestyle modifications to improve her current health and to decrease her risk of future health problems.  AGREE: Multiple dietary modification options and treatment options were discussed and  Tina [name] agreed to follow the recommendations documented in the above note.  ARRANGE: Karole was educated on the importance of frequent visits to treat obesity as outlined per CMS and USPSTF guidelines and agreed to schedule her next follow up appointment today.  Wilhemena Durie, am acting as Location manager for [employer], FNP-C.  I have reviewed the above documentation for accuracy and completeness, and I agree with the above.  -  , FNP-C.

## 2018-02-02 NOTE — Patient Outreach (Signed)
[place] [facility]) Care Management  02/02/2018  Tina [name] 04/11/1970 [id number]   Patient referred to this social worker for assistance with completing her Advanced Directive. Patient did not answer.  Voicemail message left for a return call.   Plan: This Education officer, museum will send patient an Economist. This social worker will reach out to patient again within 3-4 business days.   Sheralyn Boatman [facility] Care Management [phone]

## 2018-02-03 ENCOUNTER — Other Ambulatory Visit: Payer: Self-pay

## 2018-02-03 ENCOUNTER — Encounter (INDEPENDENT_AMBULATORY_CARE_PROVIDER_SITE_OTHER): Payer: Self-pay | Admitting: Family Medicine

## 2018-02-03 NOTE — Patient Outreach (Addendum)
[place] [facility]) Care Management  [place]  02/03/2018  Kallan MICHAELLA IMAI 12-24-1970 [id number]   Reason for call: medication assistance  Unsuccessful telephone call attempt #1 to patient.   HIPAA compliant voicemail left requesting a return call on mobile phone.  Plan:  I will make another outreach attempt to patient within 3-4 business days   Regina Eck, PharmD, [place]  [phone]

## 2018-02-03 NOTE — Patient Outreach (Signed)
  [place] [facility]) Care Management Chronic Special Needs Program   02/03/2018  Name: Tina [name], DOB: 1970-08-06  MRN: [id number]  The client was discussed in today's interdisciplinary care team meeting.  The following issues were discussed:  Client's needs, Key risk triggers/risk stratification and Care Plan  Participants present:   Thea Silversmith, MSN, RN, CCM   Ankita Newcomer RN,BSN,CCM, CDE     Marco Collie, MD Maryella Shivers, MD   Recommendations:  Client to continue to work on weight loss at the [facility] weight and Wellness clinic. Encourage using her Silver Sneaker benefit. Follow up in 3 months  Follow-up:  Follow up in 3 months  [place], Jackquline Denmark, [place] Management [phone]

## 2018-02-08 ENCOUNTER — Encounter: Payer: Self-pay | Admitting: *Deleted

## 2018-02-08 ENCOUNTER — Other Ambulatory Visit: Payer: Self-pay | Admitting: *Deleted

## 2018-02-08 ENCOUNTER — Ambulatory Visit: Payer: Self-pay | Admitting: Pharmacist

## 2018-02-08 ENCOUNTER — Other Ambulatory Visit: Payer: Self-pay | Admitting: Pharmacist

## 2018-02-08 NOTE — Patient Outreach (Addendum)
[place] [facility]) Care Management  02/08/2018  Tina [name] [birth year] [id number]   Return phone call from patient. Purpose of the call discussed in regards to completing her Advanced Directive. Patient agreed to meet on Friday, 02/12/18 for assistance with completing her Advanced Directive.   Plan: Home visit scheduled on 02/12/18.    Sheralyn Boatman [facility] Care Management [phone]

## 2018-02-08 NOTE — Patient Outreach (Signed)
[place] [facility]) Care Management  02/08/2018  Tina [name] Jan 11, 1971 [id number]   Second Outreach Call Attempt  Patient referred to this social worker for assistance with completing her Advanced Directive. Patient did not answer.  Voicemail message left for a return call.   Plan: This social worker will reach out to patient again within 3-4 business days.    Sheralyn Boatman [facility] Care Management [phone]

## 2018-02-09 NOTE — Telephone Encounter (Signed)
This encounter was created in error - please disregard.

## 2018-02-10 ENCOUNTER — Other Ambulatory Visit: Payer: Self-pay | Admitting: Allergy and Immunology

## 2018-02-11 ENCOUNTER — Other Ambulatory Visit: Payer: Self-pay | Admitting: Pharmacist

## 2018-02-11 ENCOUNTER — Ambulatory Visit: Payer: Self-pay | Admitting: Pharmacist

## 2018-02-12 ENCOUNTER — Other Ambulatory Visit: Payer: Self-pay | Admitting: *Deleted

## 2018-02-12 NOTE — Patient Outreach (Signed)
[place] [facility]) Care Management  02/12/2018  Tina [name] 1970-02-07 [id number]   Home visit to patient's home to assist with completion of patient's Advanced Directive and Living Will. This Education officer, museum reviewed the document with both patient and her spouse. Both patient and her spouse completed the document. Both were made aware that the document would need to be notarized. Per patient, she will take the document to her bank and have it notarized. Patient verbalized having no additional community resource needs at this time. This Education officer, museum will sign off at this time. Patient encouraged to call this social worker in the event that there are in additional community resources questions or concerns    Sheralyn Boatman [facility] Care Management [phone]

## 2018-02-15 ENCOUNTER — Ambulatory Visit (INDEPENDENT_AMBULATORY_CARE_PROVIDER_SITE_OTHER): Payer: HMO | Admitting: Family Medicine

## 2018-02-15 ENCOUNTER — Encounter (INDEPENDENT_AMBULATORY_CARE_PROVIDER_SITE_OTHER): Payer: Self-pay | Admitting: Family Medicine

## 2018-02-15 VITALS — BP 127/81 | HR 84 | Temp 98.4°F | Ht 67.0 in | Wt 231.0 lb

## 2018-02-15 DIAGNOSIS — F3289 Other specified depressive episodes: Secondary | ICD-10-CM | POA: Diagnosis not present

## 2018-02-15 DIAGNOSIS — Z9189 Other specified personal risk factors, not elsewhere classified: Secondary | ICD-10-CM

## 2018-02-15 DIAGNOSIS — E559 Vitamin D deficiency, unspecified: Secondary | ICD-10-CM

## 2018-02-15 DIAGNOSIS — E7849 Other hyperlipidemia: Secondary | ICD-10-CM | POA: Diagnosis not present

## 2018-02-15 DIAGNOSIS — Z6836 Body mass index (BMI) 36.0-36.9, adult: Secondary | ICD-10-CM | POA: Diagnosis not present

## 2018-02-15 DIAGNOSIS — Z794 Long term (current) use of insulin: Secondary | ICD-10-CM

## 2018-02-15 DIAGNOSIS — E1140 Type 2 diabetes mellitus with diabetic neuropathy, unspecified: Secondary | ICD-10-CM | POA: Diagnosis not present

## 2018-02-15 MED ORDER — ATORVASTATIN CALCIUM 10 MG PO TABS: 10.0000 mg | ORAL_TABLET | Freq: Every day | ORAL | 0 refills | Status: DC

## 2018-02-15 NOTE — Progress Notes (Signed)
Office: [phone]  /  Fax: [phone]   HPI:   Chief Complaint: OBESITY Tina [name] is here to discuss her progress with her obesity treatment plan. She is on the Category 3 plan with breakfast options and is following her eating plan approximately 75 % of the time. She states she is walking 45 minutes 7 times per week. Tina [name] is eating fruit rather than Category 3 breakfast. She is skipping meals and snacking on sweets at night.  Her weight is 231 lb (104.8 kg) today and has had a weight loss of 3 pounds over a period of 2 weeks since her last visit. She has lost 7 lbs since starting treatment with [name].  Diabetes II with Neuropathy and on Insulin Tina [name] has a diagnosis of diabetes type II. Tina [name] states that her last A1c (finger stick) was less than 7 per the pt at a recent endocrinology visit. She has been working on intensive lifestyle modifications including diet, exercise, and weight loss to help control her blood glucose levels. She denies hypoglycemia.  Hyperlipidemia Tina [name] has hyperlipidemia and has been trying to improve her cholesterol levels with intensive lifestyle modification including a low saturated fat diet, exercise and weight loss. She is not on a statin. Her last LDL was 138 on 11/03/17 and she has a history of stroke. She denies any chest pain or shortness of breath. The 10-year ASCVD risk score Tina Bussing DC Brooke Bonito., et al., 2013) is: 3.7%   Values used to calculate the score:     Age: 48 years     Sex: Female     Is Non-Hispanic African American: No     Diabetic: Yes     Tobacco smoker: No     Systolic Blood Pressure: 485 mmHg     Is BP treated: Yes     HDL Cholesterol: 50 mg/dL     Total Cholesterol: 230 mg/dL   At risk for cardiovascular disease Tina [name] is at a higher than average risk for cardiovascular disease due to DM,  hyperlipidemia and obesity. She currently denies any chest pain.  Depression with emotional eating behaviors Tina [name] admits to stress  eating several days per week. She tends to eat taffy at night to relieve stress. She is struggling with stress eating mostly at night and using food for comfort to the extent that it is negatively impacting her health. She often snacks when she is not hungry. Tina [name] sometimes feels she is out of control and then feels guilty that she made poor food choices.   Vitamin D deficiency Tina [name] has a diagnosis of vitamin D deficiency. She is currently taking vit D and is not at goal. She denies nausea, vomiting, or muscle weakness.  ASSESSMENT AND PLAN:  Type 2 diabetes mellitus with diabetic neuropathy, with long-term current use of insulin (HCC) - Plan: Hemoglobin A1c  Other hyperlipidemia - Plan: Lipid Panel With LDL/HDL Ratio, atorvastatin (LIPITOR) 10 MG tablet  Vitamin D deficiency - Plan: VITAMIN D 25 Hydroxy (Vit-D Deficiency, Fractures)  Other depression - with emotional eating  At risk for heart disease  Class 2 severe obesity with serious comorbidity and body mass index (BMI) of 36.0 to 36.9 in adult, unspecified obesity type (Tina [name])  PLAN:  Diabetes II with Neuropathy and on Insulin Tina [name] has been given extensive diabetes education by myself today including ideal fasting and post-prandial blood glucose readings, individual ideal Hgb A1c goals, and hypoglycemia prevention. We discussed the importance of good blood sugar control to decrease the likelihood of diabetic  complications such as nephropathy, neuropathy, limb loss, blindness, coronary artery disease, and death. We discussed the importance of intensive lifestyle modification including diet, exercise and weight loss as the first line treatment for diabetes. We will check her A1c today and she will begin statin, as well as continue her diabetes medications. She agrees with this plan and will follow up at the agreed upon time in 2 weeks.  Hyperlipidemia Tina [name] was informed of the American Heart Association Guidelines emphasizing  intensive lifestyle modifications as the first line treatment for hyperlipidemia. We discussed many lifestyle modifications today in depth, and Tina [name] will continue to work on decreasing saturated fats such as fatty red meat, butter and many fried foods. She will also increase vegetables and lean protein in her diet and continue to work on exercise and weight loss efforts. Tina [name] agrees to take Lipitor 10mg  nightly #30 with no refills.  She was advised that she must use reliable birth control as statins can cause birth defects. A FLP was ordered today and she agrees to follow up as directed.  Cardiovascular risk counseling Tina [name] was given extended (15 minutes) coronary artery disease prevention counseling today. She is 48 y.o. female and has risk factors for heart disease including hyperlipidemia and obesity. We discussed intensive lifestyle modifications today with an emphasis on specific weight loss instructions and strategies. Pt was also informed of the importance of increasing exercise and decreasing saturated fats to help prevent heart disease.  Vitamin D Deficiency Tina [name] was informed that low vitamin D levels contributes to fatigue and are associated with obesity, breast, and colon cancer. She agrees to continue to take prescription Vit D @50 ,000 IU every week and will follow up for routine testing of vitamin D, at least 2-3 times per year. She was informed of the risk of over-replacement of vitamin D and agrees to not increase her dose unless she discusses this with [name] first. We will order a vitamin D level today and she agrees to follow up as directed.  Depression with Emotional Eating Behaviors We discussed behavior modification techniques today to help Tina [name] deal with her emotional eating and depression. Patient was referred to Dr. [name], our bariatric psychologist for evaluation due to significant struggles with emotional eating.  Obesity Tina [name] is currently in the action stage of  change. As such, her goal is to continue with weight loss efforts. She has agreed to keep a food journal with 250 to 350 calories and 25 grams of protein for breakfast and follow the Category 3 plan. Tina [name] has been instructed to continue walking 7 days a week. We discussed the following Behavioral Modification Strategies today: increasing lean protein intake, no skipping meals, keeping a strict food journal, and emotional eating strategies.  Tina [name] has agreed to follow up with our clinic in 2 weeks. She was informed of the importance of frequent follow up visits to maximize her success with intensive lifestyle modifications for her multiple health conditions.  ALLERGIES: Allergies  Allergen Reactions  . Contrast Media [Iodinated Diagnostic Agents] Other (See Comments)    Difficulty breathing  . Iohexol Hives, Nausea And Vomiting and Swelling     Desc: Magnevist-gadolinium-difficulty breathing, throat swelling   . Midazolam Hcl Anaphylaxis    Difficulty breathing  . Shellfish Allergy Anaphylaxis  . Valsartan Swelling  . Metformin And Related Diarrhea and Nausea And Vomiting  . Other Itching    Patient is allergic to all nuts except peanuts.   . Avandia [Rosiglitazone Maleate] Hives and Other (See Comments)  .  Geodon [Ziprasidone] Other (See Comments)    UNKNOWN  . Kiwi Extract Itching and Swelling  . Latex Itching    MEDICATIONS: Current Outpatient Medications on File Prior to Visit  Medication Sig Dispense Refill  . allopurinol (ZYLOPRIM) 100 MG tablet Take 100 mg by mouth every evening.    Marland Kitchen amLODipine (NORVASC) 10 MG tablet Take 1 tablet (10 mg total) by mouth daily. 30 tablet 0  . aspirin 325 MG tablet Take 325 mg by mouth at bedtime.     . cetirizine (ZYRTEC) 10 MG tablet Take 10 mg by mouth daily as needed.  11  . cloNIDine (CATAPRES) 0.1 MG tablet Take 0.1 mg by mouth 2 (two) times daily.  0  . colchicine 0.6 MG tablet Take 0.6 mg by mouth daily as needed (For gout  flare-up.).   1  . diphenhydrAMINE (BENADRYL) 25 MG tablet Take 1 tablet (25 mg total) by mouth every 6 (six) hours. 12 tablet 0  . EPINEPHrine 0.3 mg/0.3 mL IJ SOAJ injection INJECT AS DIRECTED AS NEEDED FOR ALLERGIC REACTION 1 Device 2  . furosemide (LASIX) 40 MG tablet Take 40 mg by mouth as needed.    . gabapentin (NEURONTIN) 600 MG tablet Take 600-1,200 mg by mouth See admin instructions. Take 1 tablets every morning, 1 tablet at noon and 2 tablets every evening     . guanFACINE (TENEX) 1 MG tablet Take 1 mg by mouth at bedtime.    . hydrochlorothiazide (HYDRODIURIL) 50 MG tablet Take 50 mg by mouth daily.     . insulin aspart (NOVOLOG) 100 UNIT/ML injection Inject 5-25 Units into the skin 3 (three) times daily with meals. Per sliding scale--pt uses Omnipod and gets a basal rate    . Insulin Glargine (BASAGLAR KWIKPEN) 100 UNIT/ML SOPN Inject into the skin daily.    . Liraglutide (VICTOZA) 18 MG/3ML SOPN Inject 0.6 mg into the skin daily.     Marland Kitchen MACA ROOT PO Take 950 mg by mouth 2 (two) times daily. [employer] - organic Maca Root Black, Red, Yellow 950MG  capsule    . mometasone (ELOCON) 0.1 % cream 1-2 times per day 45 g 1  . montelukast (SINGULAIR) 10 MG tablet TAKE 1 TABLET BY MOUTH EVERYDAY AT BEDTIME 90 tablet 1  . naproxen (NAPROSYN) 500 MG tablet Take 1 tablet (500 mg total) by mouth 2 (two) times daily. 14 tablet 0  . OVER THE COUNTER MEDICATION Take 1 tablet by mouth 2 (two) times daily. hylands - bioplasma    . propranolol (INDERAL) 20 MG tablet Take 20 mg by mouth 3 (three) times daily.    Marland Kitchen spironolactone (ALDACTONE) 25 MG tablet Take 25 mg by mouth every morning.  1  . tiZANidine (ZANAFLEX) 2 MG tablet 1 po qhs, 90 d supply 90 tablet 1  . traMADol (ULTRAM) 50 MG tablet TAKE 1 TABLET BY MOUTH TWICE A DAY. 90 tablet 5  . Vitamin D, Ergocalciferol, (DRISDOL) 1.25 MG (50000 UT) CAPS capsule Take 1 capsule (50,000 Units total) by mouth every 7 (seven) days. 4 capsule 0   No  current facility-administered medications on file prior to visit.     PAST MEDICAL HISTORY: Past Medical History:  Diagnosis Date  . Anemia    DURING MENSES--HAS HEAVY BLEEDING WITH PERODS  . Anxiety   . Back pain, chronic    "ongoing"  . Blood transfusion    IN 2012  AFTER C -SECTION  . Cerebral thrombosis with cerebral infarction [facility]) JUNE 2011  RIGHT SIDED WEAKNESS ( ARM AND LEG ) AND SPASMS-remains with slight weakness and vertigo.  . Constipation   . Depression   . Diabetes mellitus   . Diabetic neuropathy ([place])    BOTH FEET --COMES AND GOES  . Edema, lower extremity   . Endometriosis   . Fatty liver   . H/O eye surgery   . Headache(784.0)    MIGRAINES--NOT REALLY HEADACHE-MORE LIKE PRESSURE SENSATION IN HEAD-FEELS DIZZIY AND  FAINT AS THE PRESSURE RESOLVES  . Hernia, incisional, RLQ, s/p lap repair Sep 2013 09/02/2011  . Hx of migraines 10/19/2011  . Hypertension   . Leg pain, right    "like bad Charley horse"  . Multiple food allergies   . Panic disorder without agoraphobia   . Rash    HANDS, ARMS --STATES HX OF RASH EVER SINCE CHILDBIRTH/PREGNANCY.  STATES THE RASH OFTEN OCCURS WHEN SHE IS REALLY STRESSED."goes and comes-presently left ring finger"  . Restless leg syndrome    DIAGNOSED BY SLEEP STUDY - PT TOLD SHE DID NOT HAVE SLEEP APNEA  . Right rotator cuff tear    PAIN IN RIGHT SHOULDER  . SBO (small bowel obstruction) ([place]) 06/03/2012  . Shortness of breath   . Spastic hemiplegia affecting dominant side ([place])   . Stomach problems   . Stroke ([place])   . Ventral hernia    RIGHT LOWER QUADRANT-CAUSING SOME PAIN  . Weakness of right side of body     PAST SURGICAL HISTORY: Past Surgical History:  Procedure Laterality Date  . APPLICATION OF WOUND VAC N/A 05/25/2015   Procedure: APPLICATION OF WOUND VAC;  Surgeon: Michael Boston, MD;  Location: [facility];  Service: General;  Laterality: N/A;  . CESAREAN SECTION  2012  . DIAGNOSTIC LAPAROSCOPY    .  ESOPHAGOGASTRODUODENOSCOPY N/A 06/03/2012   Procedure: ESOPHAGOGASTRODUODENOSCOPY (EGD);  Surgeon: [name] Craver, MD;  Location: [facility] ENDOSCOPY;  Service: Endoscopy;  Laterality: N/A;  . EXCISION MASS ABDOMINAL N/A 05/25/2015   Procedure: ABDOMINAL WALL EXPLORATION EXCISION OF SEROMA REMOVAL OF REDUNDANT SKIN ;  Surgeon: Michael [place], MD;  Location: [facility];  Service: General;  Laterality: N/A;  . EYE SURGERY     Eye laser for vessel hemorrhaging  . fybroid removal    . HERNIA REPAIR  10/03/11   ventral hernia repair  . INSERTION OF MESH N/A 02/04/2013   Procedure: INSERTION OF MESH;  Surgeon: Adin Hector, MD;  Location: [facility];  Service: General;  Laterality: N/A;  . UMBILICAL HERNIA REPAIR N/A 02/04/2013   Procedure: LAPAROSCOPIC ventral wall hernia repair LAPAROSCOPIC LYSIS OF ADHESIONS laparoscopic exploration of abdomen ;  Surgeon: Adin Hector, MD;  Location: [facility];  Service: General;  Laterality: N/A;  . URETER REVISION     Bilateral "twisted"  . UTERINE FIBROID SURGERY     2 SURGERIES FOR FIBROIDS  . VENTRAL HERNIA REPAIR  10/03/2011   Procedure: LAPAROSCOPIC VENTRAL HERNIA;  Surgeon: Adin Hector, MD;  Location: [facility];  Service: General;  Laterality: N/A;    SOCIAL HISTORY: Social History   Tobacco Use  . Smoking status: Never Smoker  . Smokeless tobacco: Never Used  Substance Use Topics  . Alcohol use: No    Alcohol/week: 0.0 standard drinks  . Drug use: No    FAMILY HISTORY: Family History  Problem Relation Age of Onset  . Diabetes Father   . Kidney disease Father   . Depression Father   . Drug abuse Father   . Allergic rhinitis  Mother   . Eczema Mother   . Urticaria Mother   . Depression Mother   . Anxiety disorder Mother   . Bipolar disorder Mother   . Alcoholism Mother   . Drug abuse Mother   . Eating disorder Mother   . Diabetes Maternal Grandmother   . Hyperlipidemia Paternal Grandmother   . Stroke Paternal Grandmother   . Eczema Sister   .  Urticaria Sister   . Colon cancer Paternal Uncle   . Other Neg Hx   . Angioedema Neg Hx   . Asthma Neg Hx     ROS: Review of Systems  Constitutional: Negative for weight loss.  Respiratory: Negative for shortness of breath.   Cardiovascular: Negative for chest pain.  Gastrointestinal: Negative for nausea and vomiting.  Musculoskeletal:       Negative for muscle weakness.  Endo/Heme/Allergies:       Negative for hypoglycemia.  Psychiatric/Behavioral: Positive for depression.    PHYSICAL EXAM: Blood pressure 127/81, pulse 84, temperature 98.4 F (36.9 C), temperature source Oral, height 5\' 7"  (1.702 m), weight 231 lb (104.8 kg), last menstrual period 01/09/2018, SpO2 99 %. Body mass index is 36.18 kg/m. Physical Exam Vitals signs reviewed.  Constitutional:      Appearance: Normal appearance. She is obese.  Cardiovascular:     Rate and Rhythm: Normal rate.  Pulmonary:     Effort: Pulmonary effort is normal.  Musculoskeletal: Normal range of motion.  Skin:    General: Skin is warm and dry.  Neurological:     Mental Status: She is alert and oriented to person, place, and time.  Psychiatric:        Mood and Affect: Mood normal.        Behavior: Behavior normal.     RECENT LABS AND TESTS: BMET    Component Value Date/Time   NA 141 11/03/2017 1310   K 3.7 11/03/2017 1310   CL 100 11/03/2017 1310   CO2 24 11/03/2017 1310   GLUCOSE 128 (H) 11/03/2017 1310   GLUCOSE 141 (H) 07/05/2017 1437   BUN 11 11/03/2017 1310   CREATININE 0.70 11/03/2017 1310   CALCIUM 9.1 11/03/2017 1310   GFRNONAA 103 11/03/2017 1310   GFRAA 118 11/03/2017 1310   Lab Results  Component Value Date   HGBA1C 8.4 (H) 11/03/2017   HGBA1C 7.5 (H) 05/18/2015   HGBA1C 8.7 (H) 02/05/2013   HGBA1C 6.9 (H) 06/03/2012   HGBA1C 8.3 (H) 10/17/2011   No results found for: INSULIN CBC    Component Value Date/Time   WBC 6.1 07/05/2017 1437   RBC 4.69 07/05/2017 1437   HGB 12.1 07/05/2017 1437    HCT 38.4 07/05/2017 1437   PLT 317 07/05/2017 1437   MCV 81.9 07/05/2017 1437   MCH 25.8 (L) 07/05/2017 1437   MCHC 31.5 07/05/2017 1437   RDW 13.9 07/05/2017 1437   LYMPHSABS 1.3 05/11/2017 1323   MONOABS 0.6 05/11/2017 1323   EOSABS 0.2 05/11/2017 1323   BASOSABS 0.0 05/11/2017 1323   Iron/TIBC/Ferritin/ %Sat    Component Value Date/Time   IRON 114 11/01/2015 1031   TIBC 372 11/01/2015 1031   FERRITIN 35 11/01/2015 1031   IRONPCTSAT 31 11/01/2015 1031   Lipid Panel     Component Value Date/Time   CHOL 230 (H) 11/03/2017 1310   TRIG 211 (H) 11/03/2017 1310   HDL 50 11/03/2017 1310   CHOLHDL 6.0 10/17/2011 0315   VLDL 59 (H) 10/17/2011 0315   LDLCALC 138 (H)  11/03/2017 1310   Hepatic Function Panel     Component Value Date/Time   PROT 6.8 11/03/2017 1310   ALBUMIN 3.8 11/03/2017 1310   AST 14 11/03/2017 1310   ALT 13 11/03/2017 1310   ALKPHOS 54 11/03/2017 1310   BILITOT 0.3 11/03/2017 1310   BILIDIR <0.1 06/03/2012 0220   IBILI NOT CALCULATED 06/03/2012 0220      Component Value Date/Time   TSH 0.947 11/03/2017 1310   TSH 2.020 09/08/2017 1129   TSH 1.045 06/03/2012 0520   Results for MCKINLEE, DUNK (MRN [id number]) as of 02/15/2018 11:51  Ref. Range 11/03/2017 13:10  Vitamin D, 25-Hydroxy Latest Ref Range: 30.0 - 100.0 ng/mL 10.3 (L)   OBESITY BEHAVIORAL INTERVENTION VISIT  Today's visit was # 8   Starting weight: 238 lbs Starting date: 11/03/17 Today's weight : Weight: 231 lb (104.8 kg)  Today's date: 02/15/2018 Total lbs lost to date: 7   ASK: We discussed the diagnosis of obesity with Jeyla Carmelia Roller today and Shanaia agreed to give [name] permission to discuss obesity behavioral modification therapy today.  ASSESS: Latarra has the diagnosis of obesity and her BMI today is 36.1. Jamilett is in the action stage of change.   ADVISE: Sameerah was educated on the multiple health risks of obesity as well as the benefit of weight loss to improve her  health. She was advised of the need for long term treatment and the importance of lifestyle modifications to improve her current health and to decrease her risk of future health problems.  AGREE: Multiple dietary modification options and treatment options were discussed and Italia agreed to follow the recommendations documented in the above note.  ARRANGE: Kentrell was educated on the importance of frequent visits to treat obesity as outlined per CMS and USPSTF guidelines and agreed to schedule her next follow up appointment today.  Lenward Chancellor, CMA, am acting as Location manager for [employer], FNP-C.  I have reviewed the above documentation for accuracy and completeness, and I agree with the above.  - Iori Gigante, FNP-C.

## 2018-02-16 ENCOUNTER — Ambulatory Visit: Payer: Self-pay | Admitting: Pharmacist

## 2018-02-16 ENCOUNTER — Other Ambulatory Visit: Payer: Self-pay | Admitting: Pharmacist

## 2018-02-16 LAB — LIPID PANEL WITH LDL/HDL RATIO
Cholesterol, Total: 169 mg/dL (ref 100–199)
HDL: 41 mg/dL (ref >39)
LDL Calculated: 94 mg/dL (ref 0–99)
LDl/HDL Ratio: 2.3 ratio (ref 0.0–3.2)
Triglycerides: 168 mg/dL — ABNORMAL HIGH (ref 0–149)
VLDL Cholesterol Cal: 34 mg/dL (ref 5–40)

## 2018-02-16 LAB — VITAMIN D 25 HYDROXY (VIT D DEFICIENCY, FRACTURES): Vit D, 25-Hydroxy: 26.5 ng/mL — ABNORMAL LOW (ref 30.0–100.0)

## 2018-02-16 LAB — HEMOGLOBIN A1C
Est. average glucose Bld gHb Est-mCnc: 197 mg/dL
Hgb A1c MFr Bld: 8.5 % — ABNORMAL HIGH (ref 4.8–5.6)

## 2018-02-16 NOTE — Patient Outreach (Signed)
[place] [facility]) Care Management  [place]   02/16/2018  Tina [name] 08-12-1970 [id number]  Reason for referral: Medication Assistance with insulins  Referral source: [employer] Current insurance:[employer]  PMHx includes but not limited to:  DMT2, HTN, HLD  Outreach:  Successful telephone call with [name].  HIPAA identifiers verified. Patient stated that this was not a good time to talk.  Encouraged patient to call back tomorrow   Plan: -I will await callback from patient  Regina Eck, PharmD, [place]  [phone]

## 2018-02-16 NOTE — Patient Outreach (Addendum)
[place] [facility]) Care Management  [name]  02/16/2018  Tina [name] 04/13/1970 [id number]   Reason for call: medication assistance  Unsuccessful telephone call attempt #2 to patient.   HIPAA compliant voicemail left requesting a return call  Plan:  I will make another outreach attempt to patient within 3-4 business days   Regina Eck, PharmD, [place]  [phone]

## 2018-02-16 NOTE — Patient Outreach (Signed)
[place] [facility]) Care Management  [place]  02/16/2018  Tina [name] 05-11-1970 [id number]   Reason for call: medication management  Unsuccessful telephone call attempt #3 to patient.   HIPAA compliant voicemail left requesting a return call  Plan:  I will make another outreach attempt to patient within 3-4 business days    Regina Eck, PharmD, [place]  [phone]

## 2018-02-25 ENCOUNTER — Ambulatory Visit: Payer: Self-pay | Admitting: Pharmacist

## 2018-02-25 ENCOUNTER — Other Ambulatory Visit: Payer: Self-pay | Admitting: Pharmacist

## 2018-02-25 NOTE — Patient Outreach (Signed)
[place] [facility]) Care Management [place]  02/25/2018  Tina [name] [birth year] [id number]  Reason for referral: HTA-CSNP med review/med assist  [facility] pharmacy case is being closed due to the following reasons:  We have been unable to establish and/or maintain contact with the patient.  Attempted outreach to patient x5.  Patient has been provided [facility] CM contact information if assistance needed in the future.    Thank you for allowing [facility] pharmacy to be involved in this patient's care.    Regina Eck, PharmD, [place]  [phone]

## 2018-02-26 ENCOUNTER — Ambulatory Visit: Payer: Self-pay | Admitting: Cardiology

## 2018-02-27 DIAGNOSIS — E118 Type 2 diabetes mellitus with unspecified complications: Secondary | ICD-10-CM | POA: Diagnosis not present

## 2018-02-27 DIAGNOSIS — E1140 Type 2 diabetes mellitus with diabetic neuropathy, unspecified: Secondary | ICD-10-CM | POA: Diagnosis not present

## 2018-02-27 DIAGNOSIS — Z794 Long term (current) use of insulin: Secondary | ICD-10-CM | POA: Diagnosis not present

## 2018-03-01 ENCOUNTER — Ambulatory Visit (INDEPENDENT_AMBULATORY_CARE_PROVIDER_SITE_OTHER): Payer: Federal, State, Local not specified - PPO | Admitting: Family Medicine

## 2018-03-01 ENCOUNTER — Encounter (INDEPENDENT_AMBULATORY_CARE_PROVIDER_SITE_OTHER): Payer: Self-pay | Admitting: Family Medicine

## 2018-03-01 VITALS — BP 128/79 | HR 82 | Temp 98.2°F | Ht 67.0 in | Wt 233.0 lb

## 2018-03-01 DIAGNOSIS — Z6836 Body mass index (BMI) 36.0-36.9, adult: Secondary | ICD-10-CM | POA: Diagnosis not present

## 2018-03-01 DIAGNOSIS — Z794 Long term (current) use of insulin: Secondary | ICD-10-CM | POA: Diagnosis not present

## 2018-03-01 DIAGNOSIS — E1140 Type 2 diabetes mellitus with diabetic neuropathy, unspecified: Secondary | ICD-10-CM

## 2018-03-01 DIAGNOSIS — Z9189 Other specified personal risk factors, not elsewhere classified: Secondary | ICD-10-CM

## 2018-03-01 DIAGNOSIS — E559 Vitamin D deficiency, unspecified: Secondary | ICD-10-CM

## 2018-03-01 MED ORDER — VITAMIN D (ERGOCALCIFEROL) 1.25 MG (50000 UNIT) PO CAPS: 50000.0000 [IU] | ORAL_CAPSULE | ORAL | 0 refills | Status: DC

## 2018-03-02 ENCOUNTER — Encounter (INDEPENDENT_AMBULATORY_CARE_PROVIDER_SITE_OTHER): Payer: Self-pay | Admitting: Family Medicine

## 2018-03-02 NOTE — Progress Notes (Signed)
Office: [phone]  /  Fax: [phone]   HPI:   Chief Complaint: OBESITY Tina [name] is here to discuss her progress with her obesity treatment plan. She is on the keep a food journal with 250 to 350 calories and 25 grams of protein for breakfast and follow the Category 3 plan and is following her eating plan approximately 70 % of the time. She states she is exercising 15-30 minutes 5 times per week. Tina [name] has increased protein intake since last visit. She admits to eating too many carbohydrates at dinner. Her weight is 233 lb (105.7 kg) today and has gained 2 lbs since her last visit. She has lost 5 lbs since starting treatment with [name].  Vitamin D deficiency Tina [name] has a diagnosis of vitamin D deficiency. She is currently taking prescription Vit D and denies nausea, vomiting or muscle weakness. She is not at goal. Her Vit D level is 42.5 on 02/08/2018.  Diabetes II with Neuropathy and on Insulin Tina [name] has a diagnosis of diabetes type II. Maecyn states fasting BGs range between 62 and 220 and 2 hour PP 156-173 denies any hypoglycemic episodes. Last A1c was Hemoglobin A1C Latest Ref Rng & Units 02/15/2018 11/03/2017  HGBA1C 4.8 - 5.6 % 8.5(H) 8.4(H)  Some recent data might be hidden   She is not well controlled. She sees Endocrinology. Her last visit was in January but no changes made in medications based om a POC A1c of < 7. She is currently on 18 units of Basaglar nightly, 1.2 mg of Victoza, plus 5-15 units of Novolog for meal coverage based on the carb content of her meal  She has been working on intensive lifestyle modifications including diet, exercise, and weight loss to help control her blood glucose levels.  At risk for osteopenia and osteoporosis Tina [name] is at higher risk of osteopenia and osteoporosis due to vitamin D deficiency.   ASSESSMENT AND PLAN:  Vitamin D deficiency - Plan: Vitamin D, Ergocalciferol, (DRISDOL) 1.25 MG (50000 UT) CAPS capsule  Type 2 diabetes  mellitus with diabetic neuropathy, with long-term current use of insulin (HCC)  At risk for osteoporosis  Class 2 severe obesity with serious comorbidity and body mass index (BMI) of 36.0 to 36.9 in adult, unspecified obesity type ([place])  PLAN:  Vitamin D Deficiency Tina [name] was informed that low vitamin D levels contributes to fatigue and are associated with obesity, breast, and colon cancer. She agrees to continue to take prescription Vit D 50,000 IU every week #4 for 30 days and add OTC Vit D3 2000 units daily. She will follow up for routine testing of vitamin D, at least 2-3 times per year. She was informed of the risk of over-replacement of vitamin D and agrees to not increase her dose unless she discusses this with [name] first. Tina [name] agrees to follow up with our clinic in 2 weeks.  Diabetes II with Neuropathy and on Insulin Carolee has been given extensive diabetes education by myself today including ideal fasting and post-prandial blood glucose readings, individual ideal Hgb A1c goals  and hypoglycemia prevention. We discussed the importance of good blood sugar control to decrease the likelihood of diabetic complications such as nephropathy, neuropathy, limb loss, blindness, coronary artery disease, and death. We discussed the importance of intensive lifestyle modification including diet, exercise and weight loss as the first line treatment for diabetes. Tina [name] is instructed to increase Basaglar to 22 units at night. She will continue taking Victoza and Novolog at current dose. I encouraged her to  be consistent with her meal plan as much as possible and limit carbohydrates. She will monitoring CBGs.  Tina [name] agrees to follow up with our clinic in 2 weeks.  At risk for osteopenia and osteoporosis Tina [name] was given extended  (15 minutes) osteoporosis prevention counseling today. Tina [name] is at risk for osteopenia and osteoporosis due to her vitamin D deficiency. She was encouraged to take her  vitamin D and follow her higher calcium diet and increase strengthening exercise to help strengthen her bones and decrease her risk of osteopenia and osteoporosis.  Obesity Tina [name] is currently in the action stage of change. As such, her goal is to continue with weight loss efforts She has agreed to keep a food journal with 250 to 350 calories and 25 grams of protein for breakfast and follow the Category 3 plan. Tina [name] has been instructed to increase walking to 30 minutes 5 days a week. We discussed the following Behavioral Modification Strategies today: increasing lean protein intake, decreasing simple carbohydrates, no skipping meals and planning for success   Tina [name] has agreed to follow up with our clinic in 2 weeks. She was informed of the importance of frequent follow up visits to maximize her success with intensive lifestyle modifications for her multiple health conditions.  ALLERGIES: Allergies  Allergen Reactions  . Contrast Media [Iodinated Diagnostic Agents] Other (See Comments)    Difficulty breathing  . Iohexol Hives, Nausea And Vomiting and Swelling     Desc: Magnevist-gadolinium-difficulty breathing, throat swelling   . Midazolam Hcl Anaphylaxis    Difficulty breathing  . Shellfish Allergy Anaphylaxis  . Valsartan Swelling  . Metformin And Related Diarrhea and Nausea And Vomiting  . Other Itching    Patient is allergic to all nuts except peanuts.   . Avandia [Rosiglitazone Maleate] Hives and Other (See Comments)  . Geodon [Ziprasidone] Other (See Comments)    UNKNOWN  . Kiwi Extract Itching and Swelling  . Latex Itching    MEDICATIONS: Current Outpatient Medications on File Prior to Visit  Medication Sig Dispense Refill  . allopurinol (ZYLOPRIM) 100 MG tablet Take 100 mg by mouth every evening.    Marland Kitchen amLODipine (NORVASC) 10 MG tablet Take 1 tablet (10 mg total) by mouth daily. 30 tablet 0  . aspirin 325 MG tablet Take 325 mg by mouth at bedtime.     Marland Kitchen  atorvastatin (LIPITOR) 10 MG tablet Take 1 tablet (10 mg total) by mouth daily. 30 tablet 0  . cetirizine (ZYRTEC) 10 MG tablet Take 10 mg by mouth daily as needed.  11  . cloNIDine (CATAPRES) 0.1 MG tablet Take 0.1 mg by mouth 2 (two) times daily.  0  . colchicine 0.6 MG tablet Take 0.6 mg by mouth daily as needed (For gout flare-up.).   1  . diphenhydrAMINE (BENADRYL) 25 MG tablet Take 1 tablet (25 mg total) by mouth every 6 (six) hours. 12 tablet 0  . EPINEPHrine 0.3 mg/0.3 mL IJ SOAJ injection INJECT AS DIRECTED AS NEEDED FOR ALLERGIC REACTION 1 Device 2  . furosemide (LASIX) 40 MG tablet Take 40 mg by mouth as needed.    . gabapentin (NEURONTIN) 600 MG tablet Take 600-1,200 mg by mouth See admin instructions. Take 1 tablets every morning, 1 tablet at noon and 2 tablets every evening     . guanFACINE (TENEX) 1 MG tablet Take 1 mg by mouth at bedtime.    . hydrochlorothiazide (HYDRODIURIL) 50 MG tablet Take 50 mg by mouth daily.     . insulin  aspart (NOVOLOG) 100 UNIT/ML injection Inject 5-15 Units into the skin 3 (three) times daily with meals. Per sliding scale--pt uses Omnipod and gets a basal rate     . Insulin Glargine (BASAGLAR KWIKPEN) 100 UNIT/ML SOPN Inject into the skin daily.    . Liraglutide (VICTOZA) 18 MG/3ML SOPN Inject 1.2 mg into the skin daily.    Marland Kitchen MACA ROOT PO Take 950 mg by mouth 2 (two) times daily. [employer] - organic Maca Root Black, Red, Yellow 950MG  capsule    . mometasone (ELOCON) 0.1 % cream 1-2 times per day 45 g 1  . montelukast (SINGULAIR) 10 MG tablet TAKE 1 TABLET BY MOUTH EVERYDAY AT BEDTIME 90 tablet 1  . naproxen (NAPROSYN) 500 MG tablet Take 1 tablet (500 mg total) by mouth 2 (two) times daily. 14 tablet 0  . OVER THE COUNTER MEDICATION Take 1 tablet by mouth 2 (two) times daily. hylands - bioplasma    . propranolol (INDERAL) 20 MG tablet Take 20 mg by mouth 3 (three) times daily.    Marland Kitchen spironolactone (ALDACTONE) 25 MG tablet Take 25 mg by mouth every  morning.  1  . tiZANidine (ZANAFLEX) 2 MG tablet 1 po qhs, 90 d supply 90 tablet 1  . traMADol (ULTRAM) 50 MG tablet TAKE 1 TABLET BY MOUTH TWICE A DAY. 90 tablet 5   No current facility-administered medications on file prior to visit.     PAST MEDICAL HISTORY: Past Medical History:  Diagnosis Date  . Anemia    DURING MENSES--HAS HEAVY BLEEDING WITH PERODS  . Anxiety   . Back pain, chronic    "ongoing"  . Blood transfusion    IN 2012  AFTER C -SECTION  . Cerebral thrombosis with cerebral infarction [facility]) JUNE 2011   RIGHT SIDED WEAKNESS ( ARM AND LEG ) AND SPASMS-remains with slight weakness and vertigo.  . Constipation   . Depression   . Diabetes mellitus   . Diabetic neuropathy ([place])    BOTH FEET --COMES AND GOES  . Edema, lower extremity   . Endometriosis   . Fatty liver   . H/O eye surgery   . Headache(784.0)    MIGRAINES--NOT REALLY HEADACHE-MORE LIKE PRESSURE SENSATION IN HEAD-FEELS DIZZIY AND  FAINT AS THE PRESSURE RESOLVES  . Hernia, incisional, RLQ, s/p lap repair Sep 2013 09/02/2011  . Hx of migraines 10/19/2011  . Hypertension   . Leg pain, right    "like bad Charley horse"  . Multiple food allergies   . Panic disorder without agoraphobia   . Rash    HANDS, ARMS --STATES HX OF RASH EVER SINCE CHILDBIRTH/PREGNANCY.  STATES THE RASH OFTEN OCCURS WHEN SHE IS REALLY STRESSED."goes and comes-presently left ring finger"  . Restless leg syndrome    DIAGNOSED BY SLEEP STUDY - PT TOLD SHE DID NOT HAVE SLEEP APNEA  . Right rotator cuff tear    PAIN IN RIGHT SHOULDER  . SBO (small bowel obstruction) ([place]) 06/03/2012  . Shortness of breath   . Spastic hemiplegia affecting dominant side ([place])   . Stomach problems   . Stroke ([place])   . Ventral hernia    RIGHT LOWER QUADRANT-CAUSING SOME PAIN  . Weakness of right side of body     PAST SURGICAL HISTORY: Past Surgical History:  Procedure Laterality Date  . APPLICATION OF WOUND VAC N/A 05/25/2015   Procedure: APPLICATION  OF WOUND VAC;  Surgeon: Michael Boston, MD;  Location: [facility];  Service: General;  Laterality: N/A;  .  CESAREAN SECTION  2012  . DIAGNOSTIC LAPAROSCOPY    . ESOPHAGOGASTRODUODENOSCOPY N/A 06/03/2012   Procedure: ESOPHAGOGASTRODUODENOSCOPY (EGD);  Surgeon: [name] Craver, MD;  Location: [facility] ENDOSCOPY;  Service: Endoscopy;  Laterality: N/A;  . EXCISION MASS ABDOMINAL N/A 05/25/2015   Procedure: ABDOMINAL WALL EXPLORATION EXCISION OF SEROMA REMOVAL OF REDUNDANT SKIN ;  Surgeon: Michael Boston, MD;  Location: [facility];  Service: General;  Laterality: N/A;  . EYE SURGERY     Eye laser for vessel hemorrhaging  . fybroid removal    . HERNIA REPAIR  10/03/11   ventral hernia repair  . INSERTION OF MESH N/A 02/04/2013   Procedure: INSERTION OF MESH;  Surgeon: Adin Hector, MD;  Location: [facility];  Service: General;  Laterality: N/A;  . UMBILICAL HERNIA REPAIR N/A 02/04/2013   Procedure: LAPAROSCOPIC ventral wall hernia repair LAPAROSCOPIC LYSIS OF ADHESIONS laparoscopic exploration of abdomen ;  Surgeon: Adin Hector, MD;  Location: [facility];  Service: General;  Laterality: N/A;  . URETER REVISION     Bilateral "twisted"  . UTERINE FIBROID SURGERY     2 SURGERIES FOR FIBROIDS  . VENTRAL HERNIA REPAIR  10/03/2011   Procedure: LAPAROSCOPIC VENTRAL HERNIA;  Surgeon: Adin Hector, MD;  Location: [facility];  Service: General;  Laterality: N/A;    SOCIAL HISTORY: Social History   Tobacco Use  . Smoking status: Never Smoker  . Smokeless tobacco: Never Used  Substance Use Topics  . Alcohol use: No    Alcohol/week: 0.0 standard drinks  . Drug use: No    FAMILY HISTORY: Family History  Problem Relation Age of Onset  . Diabetes Father   . Kidney disease Father   . Depression Father   . Drug abuse Father   . Allergic rhinitis Mother   . Eczema Mother   . Urticaria Mother   . Depression Mother   . Anxiety disorder Mother   . Bipolar disorder Mother   . Alcoholism Mother   . Drug abuse Mother   .  Eating disorder Mother   . Diabetes Maternal Grandmother   . Hyperlipidemia Paternal Grandmother   . Stroke Paternal Grandmother   . Eczema Sister   . Urticaria Sister   . Colon cancer Paternal Uncle   . Other Neg Hx   . Angioedema Neg Hx   . Asthma Neg Hx     ROS: Review of Systems  Constitutional: Negative for weight loss.  Gastrointestinal: Negative for diarrhea, nausea and vomiting.  Musculoskeletal:       Negative for muscle weakness  Endo/Heme/Allergies:       Negative for hypoglycemia     PHYSICAL EXAM: Blood pressure 128/79, pulse 82, temperature 98.2 F (36.8 C), height 5\' 7"  (1.702 m), weight 233 lb (105.7 kg), last menstrual period 02/14/2018, SpO2 99 %. Body mass index is 36.49 kg/m. Physical Exam Vitals signs reviewed.  Constitutional:      Appearance: Normal appearance. She is obese.  Cardiovascular:     Rate and Rhythm: Normal rate.     Pulses: Normal pulses.  Pulmonary:     Effort: Pulmonary effort is normal.  Musculoskeletal: Normal range of motion.  Skin:    General: Skin is warm and dry.  Neurological:     Mental Status: She is alert and oriented to person, place, and time.  Psychiatric:        Mood and Affect: Mood normal.        Behavior: Behavior normal.     RECENT LABS  AND TESTS: BMET    Component Value Date/Time   NA 141 11/03/2017 1310   K 3.7 11/03/2017 1310   CL 100 11/03/2017 1310   CO2 24 11/03/2017 1310   GLUCOSE 128 (H) 11/03/2017 1310   GLUCOSE 141 (H) 07/05/2017 1437   BUN 11 11/03/2017 1310   CREATININE 0.70 11/03/2017 1310   CALCIUM 9.1 11/03/2017 1310   GFRNONAA 103 11/03/2017 1310   GFRAA 118 11/03/2017 1310   Lab Results  Component Value Date   HGBA1C 8.5 (H) 02/15/2018   HGBA1C 8.4 (H) 11/03/2017   HGBA1C 7.5 (H) 05/18/2015   HGBA1C 8.7 (H) 02/05/2013   HGBA1C 6.9 (H) 06/03/2012   No results found for: INSULIN CBC    Component Value Date/Time   WBC 6.1 07/05/2017 1437   RBC 4.69 07/05/2017 1437   HGB  12.1 07/05/2017 1437   HCT 38.4 07/05/2017 1437   PLT 317 07/05/2017 1437   MCV 81.9 07/05/2017 1437   MCH 25.8 (L) 07/05/2017 1437   MCHC 31.5 07/05/2017 1437   RDW 13.9 07/05/2017 1437   LYMPHSABS 1.3 05/11/2017 1323   MONOABS 0.6 05/11/2017 1323   EOSABS 0.2 05/11/2017 1323   BASOSABS 0.0 05/11/2017 1323   Iron/TIBC/Ferritin/ %Sat    Component Value Date/Time   IRON 114 11/01/2015 1031   TIBC 372 11/01/2015 1031   FERRITIN 35 11/01/2015 1031   IRONPCTSAT 31 11/01/2015 1031   Lipid Panel     Component Value Date/Time   CHOL 169 02/15/2018 1034   TRIG 168 (H) 02/15/2018 1034   HDL 41 02/15/2018 1034   CHOLHDL 6.0 10/17/2011 0315   VLDL 59 (H) 10/17/2011 0315   LDLCALC 94 02/15/2018 1034   Hepatic Function Panel     Component Value Date/Time   PROT 6.8 11/03/2017 1310   ALBUMIN 3.8 11/03/2017 1310   AST 14 11/03/2017 1310   ALT 13 11/03/2017 1310   ALKPHOS 54 11/03/2017 1310   BILITOT 0.3 11/03/2017 1310   BILIDIR <0.1 06/03/2012 0220   IBILI NOT CALCULATED 06/03/2012 0220      Component Value Date/Time   TSH 0.947 11/03/2017 1310   TSH 2.020 09/08/2017 1129   TSH 1.045 06/03/2012 0520     Ref. Range 02/15/2018 10:34  Vitamin D, 25-Hydroxy Latest Ref Range: 30.0 - 100.0 ng/mL 26.5 (L)     OBESITY BEHAVIORAL INTERVENTION VISIT  Today's visit was # 9   Starting weight: 238 lbs Starting date: 11/03/2017 Today's weight : 233 lbs Today's date: 03/01/2018 Total lbs lost to date: 5   ASK: We discussed the diagnosis of obesity with Kamie Carmelia Roller today and Mella agreed to give [name] permission to discuss obesity behavioral modification therapy today.  ASSESS: Cleola has the diagnosis of obesity and her BMI today is 36.48 Cybele is in the action stage of change   ADVISE: Julanne was educated on the multiple health risks of obesity as well as the benefit of weight loss to improve her health. She was advised of the need for long term treatment and the  importance of lifestyle modifications to improve her current health and to decrease her risk of future health problems.  AGREE: Multiple dietary modification options and treatment options were discussed and  Ameya agreed to follow the recommendations documented in the above note.  ARRANGE: Sheriece was educated on the importance of frequent visits to treat obesity as outlined per CMS and USPSTF guidelines and agreed to schedule her next follow up appointment today.  I,  Tammy Wysor, am acting as Location manager for [employer], FNP-C.  I have reviewed the above documentation for accuracy and completeness, and I agree with the above.  - Ingri Diemer, FNP-C.

## 2018-03-09 ENCOUNTER — Other Ambulatory Visit (INDEPENDENT_AMBULATORY_CARE_PROVIDER_SITE_OTHER): Payer: Self-pay | Admitting: Family Medicine

## 2018-03-09 DIAGNOSIS — E7849 Other hyperlipidemia: Secondary | ICD-10-CM

## 2018-03-15 ENCOUNTER — Ambulatory Visit (INDEPENDENT_AMBULATORY_CARE_PROVIDER_SITE_OTHER): Payer: Federal, State, Local not specified - PPO | Admitting: Family Medicine

## 2018-03-22 ENCOUNTER — Encounter: Payer: Self-pay | Admitting: Cardiology

## 2018-03-22 ENCOUNTER — Ambulatory Visit (INDEPENDENT_AMBULATORY_CARE_PROVIDER_SITE_OTHER): Payer: HMO | Admitting: Cardiology

## 2018-03-22 VITALS — BP 158/80 | HR 92 | Ht 65.0 in | Wt 237.0 lb

## 2018-03-22 DIAGNOSIS — E669 Obesity, unspecified: Secondary | ICD-10-CM

## 2018-03-22 DIAGNOSIS — Z87898 Personal history of other specified conditions: Secondary | ICD-10-CM

## 2018-03-22 DIAGNOSIS — I10 Essential (primary) hypertension: Secondary | ICD-10-CM | POA: Diagnosis not present

## 2018-03-22 DIAGNOSIS — M7989 Other specified soft tissue disorders: Secondary | ICD-10-CM

## 2018-03-22 DIAGNOSIS — I5032 Chronic diastolic (congestive) heart failure: Secondary | ICD-10-CM

## 2018-03-22 DIAGNOSIS — Z0189 Encounter for other specified special examinations: Secondary | ICD-10-CM | POA: Insufficient documentation

## 2018-03-22 HISTORY — DX: Personal history of other specified conditions: Z87.898

## 2018-03-22 MED ORDER — SPIRONOLACTONE 50 MG PO TABS: 50.0000 mg | ORAL_TABLET | Freq: Every day | ORAL | 3 refills | Status: DC

## 2018-03-22 NOTE — Progress Notes (Signed)
Subjective:   Tina [name], female    DOB: 03/26/1970, 48 y.o.   MRN: [id number]  Tina Morning, DO:  Chief Complaint  Patient presents with  . Hypertension  . Follow-up  . Leg Swelling    HPI: Tina [name]  is a 48 y.o. female  With history of CVA with residual right-sided hemiparesis which is essentially improved and does very minimal residual defect, uncontrolled diabetes mellitus, obesity, hypertension recently evaluated by [name] for chest pain, left leg pain, and dyspnea. Had negative lexiscan nuclear stress test in August 2018 as well as Echocardiogram revealing grade 2 diastolic dysfunction with normal LVEF.   Patient was last seen by me in November 2019, amlodipine was increased to 10 mg daily for improved blood pressure control.  She is being followed by the bariatric program, but unfortunately has not lost weight recently as she reports she has not been as compliant with her diet as she should, but is hoping to restart this soon.  She has noticed increased fluid retention the last week.  Blood pressure has overall been fairly stable.  Shortness of breath on exertion has been stable.  She continues to follow with Dr. Neldon Mc for her multiple allergies. Found to be allergic to Victoza; however, she is currently tolerating right now.  She has not had any recent flare ups.  Reports labs with her PCP have been stable.   Past Medical History:  Diagnosis Date  . Anemia    DURING MENSES--HAS HEAVY BLEEDING WITH PERODS  . Anxiety   . Back pain, chronic    "ongoing"  . Blood transfusion    IN 2012  AFTER C -SECTION  . Cerebral thrombosis with cerebral infarction [facility]) JUNE 2011   RIGHT SIDED WEAKNESS ( ARM AND LEG ) AND SPASMS-remains with slight weakness and vertigo.  . Constipation   . Depression   . Diabetes mellitus   . Diabetic neuropathy ([place])    BOTH FEET --COMES AND GOES  . Edema, lower extremity   . Endometriosis   . Fatty liver   . H/O eye surgery   .  Headache(784.0)    MIGRAINES--NOT REALLY HEADACHE-MORE LIKE PRESSURE SENSATION IN HEAD-FEELS DIZZIY AND  FAINT AS THE PRESSURE RESOLVES  . Hernia, incisional, RLQ, s/p lap repair Sep 2013 09/02/2011  . History of vertigo 03/22/2018  . Hx of migraines 10/19/2011  . Hypertension   . Leg pain, right    "like bad Charley horse"  . Multiple food allergies   . Panic disorder without agoraphobia   . Rash    HANDS, ARMS --STATES HX OF RASH EVER SINCE CHILDBIRTH/PREGNANCY.  STATES THE RASH OFTEN OCCURS WHEN SHE IS REALLY STRESSED."goes and comes-presently left ring finger"  . Restless leg syndrome    DIAGNOSED BY SLEEP STUDY - PT TOLD SHE DID NOT HAVE SLEEP APNEA  . Right rotator cuff tear    PAIN IN RIGHT SHOULDER  . SBO (small bowel obstruction) ([place]) 06/03/2012  . Shortness of breath   . Spastic hemiplegia affecting dominant side ([place])   . Stomach problems   . Stroke ([place])   . Ventral hernia    RIGHT LOWER QUADRANT-CAUSING SOME PAIN  . Weakness of right side of body     Past Surgical History:  Procedure Laterality Date  . APPLICATION OF WOUND VAC N/A 05/25/2015   Procedure: APPLICATION OF WOUND VAC;  Surgeon: Michael Boston, MD;  Location: [facility];  Service: General;  Laterality: N/A;  .  CESAREAN SECTION  2012  . DIAGNOSTIC LAPAROSCOPY    . ESOPHAGOGASTRODUODENOSCOPY N/A 06/03/2012   Procedure: ESOPHAGOGASTRODUODENOSCOPY (EGD);  Surgeon: [name] Craver, MD;  Location: [facility] ENDOSCOPY;  Service: Endoscopy;  Laterality: N/A;  . EXCISION MASS ABDOMINAL N/A 05/25/2015   Procedure: ABDOMINAL WALL EXPLORATION EXCISION OF SEROMA REMOVAL OF REDUNDANT SKIN ;  Surgeon: Michael Boston, MD;  Location: [facility];  Service: General;  Laterality: N/A;  . EYE SURGERY     Eye laser for vessel hemorrhaging  . fybroid removal    . HERNIA REPAIR  10/03/11   ventral hernia repair  . INSERTION OF MESH N/A 02/04/2013   Procedure: INSERTION OF MESH;  Surgeon: Adin Hector, MD;  Location: [facility];  Service: General;   Laterality: N/A;  . UMBILICAL HERNIA REPAIR N/A 02/04/2013   Procedure: LAPAROSCOPIC ventral wall hernia repair LAPAROSCOPIC LYSIS OF ADHESIONS laparoscopic exploration of abdomen ;  Surgeon: Adin Hector, MD;  Location: [facility];  Service: General;  Laterality: N/A;  . URETER REVISION     Bilateral "twisted"  . UTERINE FIBROID SURGERY     2 SURGERIES FOR FIBROIDS  . VENTRAL HERNIA REPAIR  10/03/2011   Procedure: LAPAROSCOPIC VENTRAL HERNIA;  Surgeon: Adin Hector, MD;  Location: [facility];  Service: General;  Laterality: N/A;    Family History  Problem Relation Age of Onset  . Diabetes Father   . Kidney disease Father   . Depression Father   . Drug abuse Father   . Allergic rhinitis Mother   . Eczema Mother   . Urticaria Mother   . Depression Mother   . Anxiety disorder Mother   . Bipolar disorder Mother   . Alcoholism Mother   . Drug abuse Mother   . Eating disorder Mother   . Diabetes Maternal Grandmother   . Hyperlipidemia Paternal Grandmother   . Stroke Paternal Grandmother   . Eczema Sister   . Urticaria Sister   . Colon cancer Paternal Uncle   . Other Neg Hx   . Angioedema Neg Hx   . Asthma Neg Hx     Social History   Socioeconomic History  . Marital status: Married    Spouse name: Annie Main  . Number of children: 3  . Years of education: BA degree  . Highest education level: Not on file  Occupational History  . Occupation: stay at home mom    Employer: UNEMPLOYED  Social Needs  . Financial resource strain: Not on file  . Food insecurity:    Worry: Never true    Inability: Never true  . Transportation needs:    Medical: No    Non-medical: No  Tobacco Use  . Smoking status: Never Smoker  . Smokeless tobacco: Never Used  Substance and Sexual Activity  . Alcohol use: No    Alcohol/week: 0.0 standard drinks  . Drug use: No  . Sexual activity: Yes    Birth control/protection: None  Lifestyle  . Physical activity:    Days per week: Not on file     Minutes per session: Not on file  . Stress: Not on file  Relationships  . Social connections:    Talks on phone: Not on file    Gets together: Not on file    Attends religious service: Not on file    Active member of club or organization: Not on file    Attends meetings of clubs or organizations: Not on file    Relationship status: Not on file  .  Intimate partner violence:    Fear of current or ex partner: Not on file    Emotionally abused: Not on file    Physically abused: Not on file    Forced sexual activity: Not on file  Other Topics Concern  . Not on file  Social History Narrative   Patient is married with 2 children.   Patient is right handed.   Patient has her  BA degree.   Patient drinks 1 cup daily.    Current Meds  Medication Sig  . allopurinol (ZYLOPRIM) 100 MG tablet Take 100 mg by mouth every evening.  Marland Kitchen amLODipine (NORVASC) 10 MG tablet Take 1 tablet (10 mg total) by mouth daily.  Marland Kitchen aspirin 325 MG tablet Take 325 mg by mouth at bedtime.   Marland Kitchen atorvastatin (LIPITOR) 10 MG tablet Take 1 tablet (10 mg total) by mouth daily.  . cloNIDine (CATAPRES) 0.1 MG tablet Take 0.1 mg by mouth 2 (two) times daily.  . colchicine 0.6 MG tablet Take 0.6 mg by mouth daily as needed (For gout flare-up.).   Marland Kitchen EPINEPHrine 0.3 mg/0.3 mL IJ SOAJ injection INJECT AS DIRECTED AS NEEDED FOR ALLERGIC REACTION  . furosemide (LASIX) 40 MG tablet Take 40 mg by mouth daily.   Marland Kitchen gabapentin (NEURONTIN) 600 MG tablet Take 600-1,200 mg by mouth See admin instructions. Take 1 tablets every [name], 1 tablet at noon and 2 tablets every evening   . guanFACINE (TENEX) 1 MG tablet Take 1 mg by mouth at bedtime.  . hydrochlorothiazide (HYDRODIURIL) 50 MG tablet Take 50 mg by mouth daily.   . insulin aspart (NOVOLOG) 100 UNIT/ML injection Inject 5-15 Units into the skin 3 (three) times daily with meals. Per sliding scale--pt uses Omnipod and gets a basal rate   . Insulin Glargine (BASAGLAR KWIKPEN) 100  UNIT/ML SOPN Inject into the skin daily.  . Liraglutide (VICTOZA) 18 MG/3ML SOPN Inject 1.2 mg into the skin daily.  . propranolol (INDERAL) 20 MG tablet Take 20 mg by mouth 3 (three) times daily.  Marland Kitchen spironolactone (ALDACTONE) 25 MG tablet Take 25 mg by mouth every [name].  . traMADol (ULTRAM) 50 MG tablet TAKE 1 TABLET BY MOUTH TWICE A DAY.  Marland Kitchen Vitamin D, Ergocalciferol, (DRISDOL) 1.25 MG (50000 UT) CAPS capsule Take 1 capsule (50,000 Units total) by mouth every 7 (seven) days.     Review of Systems  Constitution: Positive for weight gain. Negative for decreased appetite, malaise/fatigue and weight loss.  Eyes: Negative for visual disturbance.  Cardiovascular: Positive for dyspnea on exertion (over exertion) and leg swelling. Negative for chest pain, claudication, orthopnea, palpitations and syncope.  Respiratory: Negative for hemoptysis and wheezing.   Endocrine: Negative for cold intolerance and heat intolerance.  Hematologic/Lymphatic: Does not bruise/bleed easily.  Skin: Negative for nail changes.  Musculoskeletal: Negative for muscle weakness and myalgias.  Gastrointestinal: Negative for abdominal pain, change in bowel habit, nausea and vomiting.  Neurological: Negative for difficulty with concentration, dizziness, focal weakness and headaches.  Psychiatric/Behavioral: Negative for altered mental status and suicidal ideas.  All other systems reviewed and are negative.      Objective:     Blood pressure (!) 158/80, pulse 92, height 5\' 5"  (1.651 m), weight 237 lb (107.5 kg), SpO2 96 %.  Cardiac studies:  EKG 03/22/2018: Normal sinus rhythm at 83 bpm with left atrial enlargement, normal axis, no evidece of ischemia.   Echocardiogram 08/21/2017: Left ventricle cavity is normal in size. Mild concentric hypertrophy of the left ventricle. Normal  global wall motion. Doppler evidence of grade II (pseudonormal) diastolic dysfunction, elevated LAP. Calculated EF 55%. Mildly restricted  aortic valve leaflets with trace aortic valve stenosis. Aortic valve mean gradient of 8 mmHg, Vmax of 2.0 m/s. Calculated aortic valve area by continuity equation is 1.4 cm. No evidence of pulmonary hypertension.  Lexiscan myoview stress test 08/21/2017:  1. Lexiscan stress test was performed. Exercise capacity was not assessed. Resting BP 148/90 mmHg, peak effect BP 168/90 mmHg. Stress symptoms included dizziness, nausea, headache, chest tightness.  2. The overall quality of the study is good. There is no evidence of abnormal lung activity. Stress and rest SPECT images demonstrate homogeneous tracer distribution throughout the myocardium. Gated SPECT imaging reveals normal myocardial thickening and wall motion. The left ventricular ejection fraction was normal calculated as 45%, although visually appears normal.  3. Low risk study.  ABI 08/21/2017: This exam reveals normal perfusion of both the  lower extremity (RABI 1.27 and LABI 1.20 with biphasic waveform).  ABI may be falsely elevated in patients with DM and medial calcinosis.  Physical Exam  Constitutional: She is oriented to person, place, and time. Vital signs are normal. She appears well-developed and well-nourished.  HENT:  Head: Normocephalic and atraumatic.  Neck: Normal range of motion.  Cardiovascular: Normal rate, regular rhythm and intact distal pulses.  Murmur heard.  Harsh midsystolic murmur is present with a grade of 1/6 at the upper right sternal border. Trace leg edema Pulses:      Femoral pulses are 2+ on the right side and 2+ on the left side.      Popliteal pulses are 2+ on the right side and 2+ on the left side.       Dorsalis pedis pulses are 2+ on the right side and 2+ on the left side.       Posterior tibial pulses are 2+ on the right side and 2+ on the left side.  Pulmonary/Chest: Effort normal and breath sounds normal. No accessory muscle usage. No respiratory distress.  Abdominal: Soft. Bowel sounds are  normal.  Musculoskeletal: Normal range of motion.  Neurological: She is alert and oriented to person, place, and time.  Skin: Skin is warm and dry.  Vitals reviewed.          Assessment & Recommendations:   1. Essential hypertension Elevated today, but has been well controlled until recently. Suspect related to 7 lb weight gain. Will increase her aldactone to 50mg  daily both for hypertension and fluid retention. Kidney function was stable in October, will repeat BMP in 10 days for close monitoring.  2. Obesity (BMI 30-39.9) Encouraged her to continue with bariatric program. Suspect is etiology for diastolic dysfunction and difficult to control hypertension. Diet modifications were discussed.  3. Chronic diastolic (congestive) heart failure (HCC) Has trace leg edema today, dyspnea has remained stable. Encouraged her to continue to be careful with her diet and avoiding salt.   4. Leg swelling Trace leg edema noted, related to weight gain. Will hopefully improve with increasing aldactone.   Plan: I will see her back in 3 months for follow up on hypertension, but encouraged her to contact me sooner for any new or worsening problems.   Jeri Lager, MSN, APRN, FNP-C [facility], [place] Office: [phone] Fax: [phone]

## 2018-03-23 ENCOUNTER — Other Ambulatory Visit: Payer: Self-pay | Admitting: Physical Medicine & Rehabilitation

## 2018-03-25 DIAGNOSIS — J101 Influenza due to other identified influenza virus with other respiratory manifestations: Secondary | ICD-10-CM | POA: Diagnosis not present

## 2018-03-25 DIAGNOSIS — R6889 Other general symptoms and signs: Secondary | ICD-10-CM | POA: Diagnosis not present

## 2018-03-25 DIAGNOSIS — J329 Chronic sinusitis, unspecified: Secondary | ICD-10-CM | POA: Diagnosis not present

## 2018-03-28 DIAGNOSIS — E1140 Type 2 diabetes mellitus with diabetic neuropathy, unspecified: Secondary | ICD-10-CM | POA: Diagnosis not present

## 2018-03-28 DIAGNOSIS — E118 Type 2 diabetes mellitus with unspecified complications: Secondary | ICD-10-CM | POA: Diagnosis not present

## 2018-03-28 DIAGNOSIS — Z794 Long term (current) use of insulin: Secondary | ICD-10-CM | POA: Diagnosis not present

## 2018-03-31 ENCOUNTER — Other Ambulatory Visit: Payer: Self-pay | Admitting: Physical Medicine & Rehabilitation

## 2018-04-01 ENCOUNTER — Other Ambulatory Visit: Payer: Self-pay | Admitting: Physical Medicine & Rehabilitation

## 2018-04-05 ENCOUNTER — Ambulatory Visit (INDEPENDENT_AMBULATORY_CARE_PROVIDER_SITE_OTHER): Payer: Federal, State, Local not specified - PPO | Admitting: Family Medicine

## 2018-04-05 ENCOUNTER — Encounter (INDEPENDENT_AMBULATORY_CARE_PROVIDER_SITE_OTHER): Payer: Self-pay

## 2018-04-08 ENCOUNTER — Ambulatory Visit (INDEPENDENT_AMBULATORY_CARE_PROVIDER_SITE_OTHER): Payer: HMO | Admitting: Family Medicine

## 2018-04-08 ENCOUNTER — Other Ambulatory Visit: Payer: Self-pay

## 2018-04-08 ENCOUNTER — Encounter (INDEPENDENT_AMBULATORY_CARE_PROVIDER_SITE_OTHER): Payer: Self-pay | Admitting: Family Medicine

## 2018-04-08 ENCOUNTER — Encounter (INDEPENDENT_AMBULATORY_CARE_PROVIDER_SITE_OTHER): Payer: Self-pay

## 2018-04-08 DIAGNOSIS — Z6836 Body mass index (BMI) 36.0-36.9, adult: Secondary | ICD-10-CM | POA: Diagnosis not present

## 2018-04-08 DIAGNOSIS — E1140 Type 2 diabetes mellitus with diabetic neuropathy, unspecified: Secondary | ICD-10-CM | POA: Diagnosis not present

## 2018-04-08 DIAGNOSIS — Z794 Long term (current) use of insulin: Secondary | ICD-10-CM

## 2018-04-08 DIAGNOSIS — F3289 Other specified depressive episodes: Secondary | ICD-10-CM

## 2018-04-08 DIAGNOSIS — E559 Vitamin D deficiency, unspecified: Secondary | ICD-10-CM

## 2018-04-08 MED ORDER — VITAMIN D (ERGOCALCIFEROL) 1.25 MG (50000 UNIT) PO CAPS: 50000.0000 [IU] | ORAL_CAPSULE | ORAL | 0 refills | Status: DC

## 2018-04-10 ENCOUNTER — Other Ambulatory Visit (INDEPENDENT_AMBULATORY_CARE_PROVIDER_SITE_OTHER): Payer: Self-pay | Admitting: Family Medicine

## 2018-04-10 DIAGNOSIS — E7849 Other hyperlipidemia: Secondary | ICD-10-CM

## 2018-04-12 NOTE — Progress Notes (Addendum)
Office: [phone]  /  Fax: [phone] TeleHealth Visit:  Tina [name] has consented to this TeleHealth visit today via Skype. The patient is located at home, the provider is located at the [employer] [facility] office. The participants in this visit include the listed provider and patient.   HPI:   Chief Complaint: OBESITY Tina [name] is here to discuss her progress with her obesity treatment plan. She is on the Category 3 plan and is following her eating plan approximately 70% of the time. She states she is doing yoga 20 minutes 7 times per week. Tina [name] reports having had the flu recently. She also reports her stress eating has increased. She states she does not have a scale and does not think she has lost weight. Tina [name] states she has been unable to buy much meat due to decreased availability due to COVID 19. She reports she is eating more carbs and has not lost weight. We were unable to weight the patient today for this TeleHealth visit.She feels as if she has not lost weight since her last visit. She has lost 5 lbs since starting treatment with [name].  Vitamin D deficiency Tina [name] has a diagnosis of Vitamin D deficiency. Her last Vitamin D level was reported to be 26.5 on 02/15/2018, which is not at goal. She is currently taking prescription Vit D and denies nausea, vomiting or muscle weakness.  Diabetes Mellitus with Neuropathy on Insulin Tina [name] has a diagnosis of diabetes type II which is not well controlled. Tina [name] states fasting blood sugars range between 73 and 170 with 2 hour postprandials in the range of 140's. She denies hypoglycemia. Last A1c was reported to be 8.5 on 02/15/18. Tina [name] is currently taking Basaglar, dose increased to 22 units at her last visit, Novolog 5-10 units TID with meals, and Victoza. She has been working on intensive lifestyle modifications including diet, exercise, and weight loss to help control her blood glucose levels. Lab Results  Component  Value Date   HGBA1C 8.5 (H) 02/15/2018    Depression with emotional eating behaviors Tina [name] has a new diagnosis of depression with emotional eating behaviors. She states she is struggling with increased stress eating due to COVID-19 and using food for comfort to the extent that it is negatively impacting her health. She often snacks when she is not hungry. Tina [name] sometimes feels she is out of control and then feels guilty that she made poor food choices. She has been working on behavior modification techniques to help reduce her emotional eating and has been somewhat successful. She shows no sign of suicidal or homicidal ideations. Tina [name] expresses the desire to meet with our bariatric psychologist, Tina [name].  Depression screen [facility] 2/9 01/28/2018 01/01/2018 11/03/2017 01/23/2017 12/12/2016  Decreased Interest 1 1 2  0 0  Down, Depressed, Hopeless 0 1 3 0 0  PHQ - 2 Score 1 2 5  0 0  Altered sleeping - - 2 - -  Tired, decreased energy - - 3 - -  Change in appetite - - 3 - -  Feeling bad or failure about yourself  - - 3 - -  Trouble concentrating - - 1 - -  Moving slowly or fidgety/restless - - 0 - -  Suicidal thoughts - - 1 - -  PHQ-9 Score - - 18 - -  Difficult doing work/chores - - Somewhat difficult - -  Some recent data might be hidden    ASSESSMENT AND PLAN:  Vitamin D deficiency - Plan: Vitamin D, Ergocalciferol, (  DRISDOL) 1.25 MG (50000 UT) CAPS capsule  Type 2 diabetes mellitus with diabetic neuropathy, with long-term current use of insulin (HCC)  Other depression - with emotional eating  Class 2 severe obesity with serious comorbidity and body mass index (BMI) of 36.0 to 36.9 in adult, unspecified obesity type (Tina [name])  PLAN:  Vitamin D Deficiency Tina [name] was informed that low Vitamin D levels contributes to fatigue and are associated with obesity, breast, and colon cancer. She agrees to continue to take prescription Vit D @ 50,000 IU every week #4 with 0 refills and will  follow-up for routine testing of Vitamin D, at least 2-3 times per year. She was informed of the risk of over-replacement of Vitamin D and agrees to not increase her dose unless she discusses this with [name] first. Tina [name] agrees to follow-up with our clinic in 2-3 weeks.  Diabetes Mellitus with Neuropathy on Insulin Tina [name] has been given extensive diabetes education by myself today including ideal fasting and post-prandial blood glucose readings, individual ideal HgA1c goals  and hypoglycemia prevention. We discussed the importance of good blood sugar control to decrease the likelihood of diabetic complications such as nephropathy, neuropathy, limb loss, blindness, coronary artery disease, and death. We discussed the importance of intensive lifestyle modification including diet, exercise and weight loss as the first line treatment for diabetes. Tina [name] agrees to continue her diabetes medications at the current dose and will follow-up with our clinic in 2-3 weeks.  Depression with Emotional Eating Behaviors We discussed behavior modification techniques today to help Tina [name] deal with her emotional eating and depression. The patient was referred to Tina [name], our bariatric psychologist, due to significant struggles with emotional eating due to COVID-19.  Obesity Tina [name] is currently in the action stage of change. As such, her goal is to continue with weight loss efforts. She has agreed to follow the Category 3 plan. Tina [name] has been instructed to continue doing yoga 20 minutes 7 times per week. We discussed the following Behavioral Modification Strategies today: increasing lean protein intake, decreasing simple carbohydrates, planning for success, and decrease junk food.  Tina [name] has agreed to follow-up with our clinic in 2-3 weeks. She was informed of the importance of frequent follow-up visits to maximize her success with intensive lifestyle modifications for her multiple health conditions.   ALLERGIES: Allergies  Allergen Reactions  . Contrast Media [Iodinated Diagnostic Agents] Other (See Comments)    Difficulty breathing  . Iohexol Hives, Nausea And Vomiting and Swelling     Desc: Magnevist-gadolinium-difficulty breathing, throat swelling   . Midazolam Hcl Anaphylaxis    Difficulty breathing  . Shellfish Allergy Anaphylaxis  . Valsartan Swelling  . Metformin And Related Diarrhea and Nausea And Vomiting  . Other Itching    Patient is allergic to all nuts except peanuts.   . Avandia [Rosiglitazone Maleate] Hives and Other (See Comments)  . Geodon [Ziprasidone] Other (See Comments)    UNKNOWN  . Kiwi Extract Itching and Swelling  . Latex Itching    MEDICATIONS: Current Outpatient Medications on File Prior to Visit  Medication Sig Dispense Refill  . allopurinol (ZYLOPRIM) 100 MG tablet Take 100 mg by mouth every evening.    Marland Kitchen amLODipine (NORVASC) 10 MG tablet Take 1 tablet (10 mg total) by mouth daily. 30 tablet 0  . aspirin 325 MG tablet Take 325 mg by mouth at bedtime.     Marland Kitchen atorvastatin (LIPITOR) 10 MG tablet Take 1 tablet (10 mg total) by mouth daily. 30 tablet 0  .  cetirizine (ZYRTEC) 10 MG tablet Take 10 mg by mouth daily as needed.  11  . cloNIDine (CATAPRES) 0.1 MG tablet Take 0.1 mg by mouth 2 (two) times daily.  0  . colchicine 0.6 MG tablet Take 0.6 mg by mouth daily as needed (For gout flare-up.).   1  . diphenhydrAMINE (BENADRYL) 25 MG tablet Take 1 tablet (25 mg total) by mouth every 6 (six) hours. (Patient not taking: Reported on 03/22/2018) 12 tablet 0  . EPINEPHrine 0.3 mg/0.3 mL IJ SOAJ injection INJECT AS DIRECTED AS NEEDED FOR ALLERGIC REACTION 1 Device 2  . furosemide (LASIX) 40 MG tablet Take 40 mg by mouth daily.     Marland Kitchen gabapentin (NEURONTIN) 600 MG tablet Take 600-1,200 mg by mouth See admin instructions. Take 1 tablets every morning, 1 tablet at noon and 2 tablets every evening     . guanFACINE (TENEX) 1 MG tablet Take 1 mg by mouth at bedtime.     . hydrochlorothiazide (HYDRODIURIL) 50 MG tablet Take 50 mg by mouth daily.     . insulin aspart (NOVOLOG) 100 UNIT/ML injection Inject 5-15 Units into the skin 3 (three) times daily with meals. Per sliding scale--pt uses Omnipod and gets a basal rate     . Insulin Glargine (BASAGLAR KWIKPEN) 100 UNIT/ML SOPN Inject into the skin daily.    . Liraglutide (VICTOZA) 18 MG/3ML SOPN Inject 1.2 mg into the skin daily.    Marland Kitchen MACA ROOT PO Take 950 mg by mouth 2 (two) times daily. [employer] - organic Maca Root Black, Red, Yellow 950MG  capsule    . mometasone (ELOCON) 0.1 % cream 1-2 times per day (Patient not taking: Reported on 03/22/2018) 45 g 1  . montelukast (SINGULAIR) 10 MG tablet TAKE 1 TABLET BY MOUTH EVERYDAY AT BEDTIME (Patient not taking: Reported on 03/22/2018) 90 tablet 1  . naproxen (NAPROSYN) 500 MG tablet Take 1 tablet (500 mg total) by mouth 2 (two) times daily. (Patient not taking: Reported on 03/22/2018) 14 tablet 0  . OVER THE COUNTER MEDICATION Take 1 tablet by mouth 2 (two) times daily. hylands - bioplasma    . propranolol (INDERAL) 20 MG tablet Take 20 mg by mouth daily.    Marland Kitchen spironolactone (ALDACTONE) 50 MG tablet Take 1 tablet (50 mg total) by mouth daily. 90 tablet 3  . tiZANidine (ZANAFLEX) 2 MG tablet TAKE 1 TABLET AT BEDTIME DAILY. CAN TAKE 1/2 TABLET IN THE AM AS NEEDED 135 tablet 1  . traMADol (ULTRAM) 50 MG tablet TAKE 1 TABLET BY MOUTH 3 TIMES A DAY AS NEEDED 90 tablet 3   No current facility-administered medications on file prior to visit.     PAST MEDICAL HISTORY: Past Medical History:  Diagnosis Date  . Anemia    DURING MENSES--HAS HEAVY BLEEDING WITH PERODS  . Anxiety   . Back pain, chronic    "ongoing"  . Blood transfusion    IN 2012  AFTER C -SECTION  . Cerebral thrombosis with cerebral infarction [facility]) JUNE 2011   RIGHT SIDED WEAKNESS ( ARM AND LEG ) AND SPASMS-remains with slight weakness and vertigo.  . Constipation   . Depression   . Diabetes mellitus    . Diabetic neuropathy ([place])    BOTH FEET --COMES AND GOES  . Edema, lower extremity   . Endometriosis   . Fatty liver   . H/O eye surgery   . Headache(784.0)    MIGRAINES--NOT REALLY HEADACHE-MORE LIKE PRESSURE SENSATION IN HEAD-FEELS DIZZIY AND  FAINT AS THE PRESSURE RESOLVES  . Hernia, incisional, RLQ, s/p lap repair Sep 2013 09/02/2011  . History of vertigo 03/22/2018  . Hx of migraines 10/19/2011  . Hypertension   . Leg pain, right    "like bad Charley horse"  . Multiple food allergies   . Panic disorder without agoraphobia   . Rash    HANDS, ARMS --STATES HX OF RASH EVER SINCE CHILDBIRTH/PREGNANCY.  STATES THE RASH OFTEN OCCURS WHEN SHE IS REALLY STRESSED."goes and comes-presently left ring finger"  . Restless leg syndrome    DIAGNOSED BY SLEEP STUDY - PT TOLD SHE DID NOT HAVE SLEEP APNEA  . Right rotator cuff tear    PAIN IN RIGHT SHOULDER  . SBO (small bowel obstruction) ([place]) 06/03/2012  . Shortness of breath   . Spastic hemiplegia affecting dominant side ([place])   . Stomach problems   . Stroke ([place])   . Ventral hernia    RIGHT LOWER QUADRANT-CAUSING SOME PAIN  . Weakness of right side of body     PAST SURGICAL HISTORY: Past Surgical History:  Procedure Laterality Date  . APPLICATION OF WOUND VAC N/A 05/25/2015   Procedure: APPLICATION OF WOUND VAC;  Surgeon: Michael Boston, MD;  Location: [facility] ORS;  Service: General;  Laterality: N/A;  . CESAREAN SECTION  2012  . DIAGNOSTIC LAPAROSCOPY    . ESOPHAGOGASTRODUODENOSCOPY N/A 06/03/2012   Procedure: ESOPHAGOGASTRODUODENOSCOPY (EGD);  Surgeon: [name] Craver, MD;  [facility] ENDOSCOPY;  Service: Endoscopy;  Laterality: N/A;  . EXCISION MASS ABDOMINAL N/A 05/25/2015   Procedure: ABDOMINAL WALL EXPLORATION EXCISION OF SEROMA REMOVAL OF REDUNDANT SKIN ;  Surgeon: Michael Boston, MD;  Location: [facility];  Service: General;  Laterality: N/A;  . EYE SURGERY     Eye laser for vessel hemorrhaging  . fybroid removal    . HERNIA REPAIR  10/03/11    ventral hernia repair  . INSERTION OF MESH N/A 02/04/2013   Procedure: INSERTION OF MESH;  Surgeon: Adin Hector, MD;  Location: [facility];  Service: General;  Laterality: N/A;  . UMBILICAL HERNIA REPAIR N/A 02/04/2013   Procedure: LAPAROSCOPIC ventral wall hernia repair LAPAROSCOPIC LYSIS OF ADHESIONS laparoscopic exploration of abdomen ;  Surgeon: Adin Hector, MD;  Location: [facility];  Service: General;  Laterality: N/A;  . URETER REVISION     Bilateral "twisted"  . UTERINE FIBROID SURGERY     2 SURGERIES FOR FIBROIDS  . VENTRAL HERNIA REPAIR  10/03/2011   Procedure: LAPAROSCOPIC VENTRAL HERNIA;  Surgeon: Adin Hector, MD;  Location: [facility];  Service: General;  Laterality: N/A;    SOCIAL HISTORY: Social History   Tobacco Use  . Smoking status: Never Smoker  . Smokeless tobacco: Never Used  Substance Use Topics  . Alcohol use: No    Alcohol/week: 0.0 standard drinks  . Drug use: No    FAMILY HISTORY: Family History  Problem Relation Age of Onset  . Diabetes Father   . Kidney disease Father   . Depression Father   . Drug abuse Father   . Allergic rhinitis Mother   . Eczema Mother   . Urticaria Mother   . Depression Mother   . Anxiety disorder Mother   . Bipolar disorder Mother   . Alcoholism Mother   . Drug abuse Mother   . Eating disorder Mother   . Diabetes Maternal Grandmother   . Hyperlipidemia Paternal Grandmother   . Stroke Paternal Grandmother   . Eczema Sister   . Urticaria Sister   .  Colon cancer Paternal Uncle   . Other Neg Hx   . Angioedema Neg Hx   . Asthma Neg Hx    ROS: Review of Systems  Gastrointestinal: Negative for nausea and vomiting.  Musculoskeletal:       Negative for muscle weakness.  Neurological:       Positive for neuropathy.  Endo/Heme/Allergies:       Negative for hypoglycemia.  Psychiatric/Behavioral: Positive for depression (emotional eating).   PHYSICAL EXAM: Pt in no acute distress  RECENT LABS AND TESTS: BMET     Component Value Date/Time   NA 141 11/03/2017 1310   K 3.7 11/03/2017 1310   CL 100 11/03/2017 1310   CO2 24 11/03/2017 1310   GLUCOSE 128 (H) 11/03/2017 1310   GLUCOSE 141 (H) 07/05/2017 1437   BUN 11 11/03/2017 1310   CREATININE 0.70 11/03/2017 1310   CALCIUM 9.1 11/03/2017 1310   GFRNONAA 103 11/03/2017 1310   GFRAA 118 11/03/2017 1310   Lab Results  Component Value Date   HGBA1C 8.5 (H) 02/15/2018   HGBA1C 8.4 (H) 11/03/2017   HGBA1C 7.5 (H) 05/18/2015   HGBA1C 8.7 (H) 02/05/2013   HGBA1C 6.9 (H) 06/03/2012   No results found for: INSULIN CBC    Component Value Date/Time   WBC 6.1 07/05/2017 1437   RBC 4.69 07/05/2017 1437   HGB 12.1 07/05/2017 1437   HCT 38.4 07/05/2017 1437   PLT 317 07/05/2017 1437   MCV 81.9 07/05/2017 1437   MCH 25.8 (L) 07/05/2017 1437   MCHC 31.5 07/05/2017 1437   RDW 13.9 07/05/2017 1437   LYMPHSABS 1.3 05/11/2017 1323   MONOABS 0.6 05/11/2017 1323   EOSABS 0.2 05/11/2017 1323   BASOSABS 0.0 05/11/2017 1323   Iron/TIBC/Ferritin/ %Sat    Component Value Date/Time   IRON 114 11/01/2015 1031   TIBC 372 11/01/2015 1031   FERRITIN 35 11/01/2015 1031   IRONPCTSAT 31 11/01/2015 1031   Lipid Panel     Component Value Date/Time   CHOL 169 02/15/2018 1034   TRIG 168 (H) 02/15/2018 1034   HDL 41 02/15/2018 1034   CHOLHDL 6.0 10/17/2011 0315   VLDL 59 (H) 10/17/2011 0315   LDLCALC 94 02/15/2018 1034   Hepatic Function Panel     Component Value Date/Time   PROT 6.8 11/03/2017 1310   ALBUMIN 3.8 11/03/2017 1310   AST 14 11/03/2017 1310   ALT 13 11/03/2017 1310   ALKPHOS 54 11/03/2017 1310   BILITOT 0.3 11/03/2017 1310   BILIDIR <0.1 06/03/2012 0220   IBILI NOT CALCULATED 06/03/2012 0220      Component Value Date/Time   TSH 0.947 11/03/2017 1310   TSH 2.020 09/08/2017 1129   TSH 1.045 06/03/2012 0520   Results for KALYANI, MAEDA (MRN [id number]) as of 04/12/2018 09:51  Ref. Range 02/15/2018 10:34  Vitamin D, 25-Hydroxy  Latest Ref Range: 30.0 - 100.0 ng/mL 26.5 (L)    I, Michaelene Song, am acting as Location manager for [employer], FNP-C.  I have reviewed the above documentation for accuracy and completeness, and I agree with the above.  - Braedon Sjogren, FNP-C.

## 2018-04-13 ENCOUNTER — Encounter (INDEPENDENT_AMBULATORY_CARE_PROVIDER_SITE_OTHER): Payer: Self-pay | Admitting: Family Medicine

## 2018-04-13 DIAGNOSIS — F329 Major depressive disorder, single episode, unspecified: Secondary | ICD-10-CM | POA: Insufficient documentation

## 2018-04-13 DIAGNOSIS — F32A Depression, unspecified: Secondary | ICD-10-CM | POA: Insufficient documentation

## 2018-04-20 ENCOUNTER — Encounter (HOSPITAL_COMMUNITY): Payer: Self-pay | Admitting: Emergency Medicine

## 2018-04-20 ENCOUNTER — Emergency Department (HOSPITAL_COMMUNITY): Payer: Federal, State, Local not specified - PPO

## 2018-04-20 ENCOUNTER — Other Ambulatory Visit: Payer: Self-pay

## 2018-04-20 ENCOUNTER — Emergency Department (HOSPITAL_COMMUNITY)
Admission: EM | Admit: 2018-04-20 | Discharge: 2018-04-20 | Disposition: A | Discharge status: Home / Self Care | Payer: Federal, State, Local not specified - PPO | Attending: Emergency Medicine | Admitting: Emergency Medicine

## 2018-04-20 DIAGNOSIS — I455 Other specified heart block: Secondary | ICD-10-CM | POA: Diagnosis not present

## 2018-04-20 DIAGNOSIS — Z79899 Other long term (current) drug therapy: Secondary | ICD-10-CM | POA: Insufficient documentation

## 2018-04-20 DIAGNOSIS — K625 Hemorrhage of anus and rectum: Secondary | ICD-10-CM | POA: Diagnosis not present

## 2018-04-20 DIAGNOSIS — S61217A Laceration without foreign body of left little finger without damage to nail, initial encounter: Secondary | ICD-10-CM | POA: Diagnosis not present

## 2018-04-20 DIAGNOSIS — Z1231 Encounter for screening mammogram for malignant neoplasm of breast: Secondary | ICD-10-CM | POA: Diagnosis not present

## 2018-04-20 DIAGNOSIS — Z23 Encounter for immunization: Secondary | ICD-10-CM | POA: Diagnosis not present

## 2018-04-20 DIAGNOSIS — I110 Hypertensive heart disease with heart failure: Secondary | ICD-10-CM | POA: Insufficient documentation

## 2018-04-20 DIAGNOSIS — Z794 Long term (current) use of insulin: Secondary | ICD-10-CM | POA: Diagnosis not present

## 2018-04-20 DIAGNOSIS — R109 Unspecified abdominal pain: Secondary | ICD-10-CM | POA: Diagnosis not present

## 2018-04-20 DIAGNOSIS — F331 Major depressive disorder, recurrent, moderate: Secondary | ICD-10-CM | POA: Diagnosis not present

## 2018-04-20 DIAGNOSIS — M85859 Other specified disorders of bone density and structure, unspecified thigh: Secondary | ICD-10-CM | POA: Diagnosis not present

## 2018-04-20 DIAGNOSIS — E039 Hypothyroidism, unspecified: Secondary | ICD-10-CM | POA: Diagnosis not present

## 2018-04-20 DIAGNOSIS — R1084 Generalized abdominal pain: Secondary | ICD-10-CM | POA: Diagnosis not present

## 2018-04-20 DIAGNOSIS — G4733 Obstructive sleep apnea (adult) (pediatric): Secondary | ICD-10-CM | POA: Diagnosis not present

## 2018-04-20 DIAGNOSIS — R319 Hematuria, unspecified: Secondary | ICD-10-CM | POA: Diagnosis not present

## 2018-04-20 DIAGNOSIS — I5032 Chronic diastolic (congestive) heart failure: Secondary | ICD-10-CM | POA: Diagnosis not present

## 2018-04-20 DIAGNOSIS — F0281 Dementia in other diseases classified elsewhere with behavioral disturbance: Secondary | ICD-10-CM | POA: Diagnosis not present

## 2018-04-20 DIAGNOSIS — F064 Anxiety disorder due to known physiological condition: Secondary | ICD-10-CM | POA: Diagnosis not present

## 2018-04-20 DIAGNOSIS — Z01818 Encounter for other preprocedural examination: Secondary | ICD-10-CM | POA: Diagnosis not present

## 2018-04-20 DIAGNOSIS — Z7982 Long term (current) use of aspirin: Secondary | ICD-10-CM | POA: Insufficient documentation

## 2018-04-20 DIAGNOSIS — G300 Alzheimer's disease with early onset: Secondary | ICD-10-CM | POA: Diagnosis not present

## 2018-04-20 DIAGNOSIS — S42391D Other fracture of shaft of right humerus, subsequent encounter for fracture with routine healing: Secondary | ICD-10-CM | POA: Diagnosis not present

## 2018-04-20 DIAGNOSIS — S32401D Unspecified fracture of right acetabulum, subsequent encounter for fracture with routine healing: Secondary | ICD-10-CM | POA: Diagnosis not present

## 2018-04-20 DIAGNOSIS — D259 Leiomyoma of uterus, unspecified: Secondary | ICD-10-CM | POA: Diagnosis not present

## 2018-04-20 DIAGNOSIS — Z1211 Encounter for screening for malignant neoplasm of colon: Secondary | ICD-10-CM | POA: Diagnosis not present

## 2018-04-20 DIAGNOSIS — E161 Other hypoglycemia: Secondary | ICD-10-CM | POA: Diagnosis not present

## 2018-04-20 DIAGNOSIS — I491 Atrial premature depolarization: Secondary | ICD-10-CM | POA: Diagnosis not present

## 2018-04-20 DIAGNOSIS — I509 Heart failure, unspecified: Secondary | ICD-10-CM | POA: Diagnosis not present

## 2018-04-20 DIAGNOSIS — J449 Chronic obstructive pulmonary disease, unspecified: Secondary | ICD-10-CM | POA: Diagnosis not present

## 2018-04-20 DIAGNOSIS — G251 Drug-induced tremor: Secondary | ICD-10-CM | POA: Diagnosis not present

## 2018-04-20 DIAGNOSIS — E119 Type 2 diabetes mellitus without complications: Secondary | ICD-10-CM | POA: Diagnosis not present

## 2018-04-20 DIAGNOSIS — L600 Ingrowing nail: Secondary | ICD-10-CM | POA: Diagnosis not present

## 2018-04-20 DIAGNOSIS — Z20828 Contact with and (suspected) exposure to other viral communicable diseases: Secondary | ICD-10-CM | POA: Diagnosis not present

## 2018-04-20 DIAGNOSIS — Z91040 Latex allergy status: Secondary | ICD-10-CM | POA: Insufficient documentation

## 2018-04-20 DIAGNOSIS — D122 Benign neoplasm of ascending colon: Secondary | ICD-10-CM | POA: Diagnosis not present

## 2018-04-20 DIAGNOSIS — Z91419 Personal history of unspecified adult abuse: Secondary | ICD-10-CM | POA: Diagnosis not present

## 2018-04-20 DIAGNOSIS — Z8719 Personal history of other diseases of the digestive system: Secondary | ICD-10-CM | POA: Diagnosis not present

## 2018-04-20 DIAGNOSIS — J9622 Acute and chronic respiratory failure with hypercapnia: Secondary | ICD-10-CM | POA: Diagnosis not present

## 2018-04-20 DIAGNOSIS — R062 Wheezing: Secondary | ICD-10-CM | POA: Diagnosis not present

## 2018-04-20 DIAGNOSIS — D124 Benign neoplasm of descending colon: Secondary | ICD-10-CM | POA: Diagnosis not present

## 2018-04-20 DIAGNOSIS — I480 Paroxysmal atrial fibrillation: Secondary | ICD-10-CM | POA: Diagnosis not present

## 2018-04-20 DIAGNOSIS — M170 Bilateral primary osteoarthritis of knee: Secondary | ICD-10-CM | POA: Diagnosis not present

## 2018-04-20 DIAGNOSIS — I10 Essential (primary) hypertension: Secondary | ICD-10-CM | POA: Diagnosis not present

## 2018-04-20 DIAGNOSIS — H40023 Open angle with borderline findings, high risk, bilateral: Secondary | ICD-10-CM | POA: Diagnosis not present

## 2018-04-20 DIAGNOSIS — M5441 Lumbago with sciatica, right side: Secondary | ICD-10-CM | POA: Diagnosis not present

## 2018-04-20 DIAGNOSIS — D219 Benign neoplasm of connective and other soft tissue, unspecified: Secondary | ICD-10-CM

## 2018-04-20 DIAGNOSIS — Z6828 Body mass index (BMI) 28.0-28.9, adult: Secondary | ICD-10-CM | POA: Diagnosis not present

## 2018-04-20 DIAGNOSIS — H25813 Combined forms of age-related cataract, bilateral: Secondary | ICD-10-CM | POA: Diagnosis not present

## 2018-04-20 LAB — CBC
HCT: 42.4 % (ref 36.0–46.0)
Hemoglobin: 13.5 g/dL (ref 12.0–15.0)
MCH: 25.7 pg — ABNORMAL LOW (ref 26.0–34.0)
MCHC: 31.8 g/dL (ref 30.0–36.0)
MCV: 80.6 fL (ref 80.0–100.0)
Platelets: 348 10*3/uL (ref 150–400)
RBC: 5.26 MIL/uL — ABNORMAL HIGH (ref 3.87–5.11)
RDW: 14.6 % (ref 11.5–15.5)
WBC: 13.1 10*3/uL — ABNORMAL HIGH (ref 4.0–10.5)
nRBC: 0 % (ref 0.0–0.2)

## 2018-04-20 LAB — LIPASE, BLOOD: Lipase: 34 U/L (ref 11–51)

## 2018-04-20 LAB — COMPREHENSIVE METABOLIC PANEL
ALT: 14 U/L (ref 0–44)
AST: 18 U/L (ref 15–41)
Albumin: 3.7 g/dL (ref 3.5–5.0)
Alkaline Phosphatase: 60 U/L (ref 38–126)
Anion gap: 13 (ref 5–15)
BUN: 22 mg/dL — ABNORMAL HIGH (ref 6–20)
CO2: 25 mmol/L (ref 22–32)
Calcium: 10.1 mg/dL (ref 8.9–10.3)
Chloride: 99 mmol/L (ref 98–111)
Creatinine, Ser: 1.11 mg/dL — ABNORMAL HIGH (ref 0.44–1.00)
GFR calc Af Amer: >60 mL/min (ref >60)
GFR calc non Af Amer: 59 mL/min — ABNORMAL LOW (ref >60)
Glucose, Bld: 240 mg/dL — ABNORMAL HIGH (ref 70–99)
Potassium: 3.6 mmol/L (ref 3.5–5.1)
Sodium: 137 mmol/L (ref 135–145)
Total Bilirubin: 0.8 mg/dL (ref 0.3–1.2)
Total Protein: 8 g/dL (ref 6.5–8.1)

## 2018-04-20 LAB — URINALYSIS, ROUTINE W REFLEX MICROSCOPIC
Bilirubin Urine: NEGATIVE
Glucose, UA: >=500 mg/dL — AB
Ketones, ur: NEGATIVE mg/dL
Leukocytes,Ua: NEGATIVE
Nitrite: NEGATIVE
Protein, ur: 100 mg/dL — AB
RBC / HPF: >50 RBC/hpf — ABNORMAL HIGH (ref 0–5)
Specific Gravity, Urine: 1.013 (ref 1.005–1.030)
pH: 5 (ref 5.0–8.0)

## 2018-04-20 LAB — I-STAT BETA HCG BLOOD, ED (MC, WL, AP ONLY): I-stat hCG, quantitative: <5 m[IU]/mL (ref <5)

## 2018-04-20 LAB — LACTIC ACID, PLASMA
Lactic Acid, Venous: 2.2 mmol/L (ref 0.5–1.9)
Lactic Acid, Venous: 2.2 mmol/L (ref 0.5–1.9)

## 2018-04-20 LAB — POC OCCULT BLOOD, ED: Fecal Occult Bld: POSITIVE — AB

## 2018-04-20 MED ORDER — SODIUM CHLORIDE 0.9 % IV BOLUS
1000.0000 mL | Freq: Once | INTRAVENOUS | Status: AC
  Administered 2018-04-20: 1000 mL via INTRAVENOUS

## 2018-04-20 MED ORDER — NAPROXEN 500 MG PO TABS: 500.0000 mg | ORAL_TABLET | Freq: Two times a day (BID) | ORAL | 0 refills | Status: DC

## 2018-04-20 MED ORDER — FENTANYL CITRATE (PF) 100 MCG/2ML IJ SOLN
50.0000 ug | Freq: Once | INTRAMUSCULAR | Status: AC
  Administered 2018-04-20: 50 ug via INTRAVENOUS
  Filled 2018-04-20: qty 2

## 2018-04-20 MED ORDER — ONDANSETRON HCL 4 MG PO TABS: 4.0000 mg | ORAL_TABLET | Freq: Three times a day (TID) | ORAL | 0 refills | Status: DC | PRN

## 2018-04-20 MED ORDER — ONDANSETRON HCL 4 MG/2ML IJ SOLN
4.0000 mg | Freq: Once | INTRAMUSCULAR | Status: AC
  Administered 2018-04-20: 4 mg via INTRAVENOUS
  Filled 2018-04-20: qty 2

## 2018-04-20 MED ORDER — SODIUM CHLORIDE 0.9% FLUSH: 3.0000 mL | Freq: Once | INTRAVENOUS | Status: DC

## 2018-04-20 NOTE — ED Provider Notes (Signed)
[facility] EMERGENCY DEPARTMENT Provider Note   CSN: [id number] Arrival date & time: 04/20/18  1517    History   Chief Complaint Chief Complaint  Patient presents with   Abdominal Pain    HPI Tina [name] is a 48 y.o. female.     The history is provided by the patient.  Abdominal Pain  Pain location:  Generalized Pain quality: aching, cramping and dull   Pain radiates to:  Does not radiate Pain severity:  Mild Onset quality:  Gradual Duration:  1 day Timing:  Constant Progression:  Worsening Chronicity:  New Context: not sick contacts and not suspicious food intake   Relieved by:  Nothing Worsened by:  Palpation Associated symptoms: diarrhea, hematochezia (blood intermixed with stool ), nausea, vaginal bleeding (on menstraul cycle) and vomiting   Associated symptoms: no anorexia, no belching, no chest pain, no chills, no constipation, no cough, no dysuria, no fever, no hematuria, no melena, no shortness of breath, no sore throat and no vaginal discharge   Risk factors: multiple surgeries     Past Medical History:  Diagnosis Date   Anemia    DURING MENSES--HAS HEAVY BLEEDING WITH PERODS   Anxiety    Back pain, chronic    "ongoing"   Blood transfusion    IN 2012  AFTER C -SECTION   Cerebral thrombosis with cerebral infarction [facility]) JUNE 2011   RIGHT SIDED WEAKNESS ( ARM AND LEG ) AND SPASMS-remains with slight weakness and vertigo.   Constipation    Depression    Diabetes mellitus    Diabetic neuropathy ([place])    BOTH FEET --COMES AND GOES   Edema, lower extremity    Endometriosis    Fatty liver    H/O eye surgery    Headache(784.0)    MIGRAINES--NOT REALLY HEADACHE-MORE LIKE PRESSURE SENSATION IN HEAD-FEELS DIZZIY AND  FAINT AS THE PRESSURE RESOLVES   Hernia, incisional, RLQ, s/p lap repair Sep 2013 09/02/2011   History of vertigo 03/22/2018   Hx of migraines 10/19/2011   Hypertension    Leg pain, right    "like  bad Charley horse"   Multiple food allergies    Panic disorder without agoraphobia    Rash    HANDS, ARMS --STATES HX OF RASH EVER SINCE CHILDBIRTH/PREGNANCY.  STATES THE RASH OFTEN OCCURS WHEN SHE IS REALLY STRESSED."goes and comes-presently left ring finger"   Restless leg syndrome    DIAGNOSED BY SLEEP STUDY - PT TOLD SHE DID NOT HAVE SLEEP APNEA   Right rotator cuff tear    PAIN IN RIGHT SHOULDER   SBO (small bowel obstruction) ([place]) 06/03/2012   Shortness of breath    Spastic hemiplegia affecting dominant side (HCC)    Stomach problems    Stroke (HCC)    Ventral hernia    RIGHT LOWER QUADRANT-CAUSING SOME PAIN   Weakness of right side of body     Patient Active Problem List   Diagnosis Date Noted   Depression 04/13/2018   History of vertigo 03/22/2018   Laboratory examination 03/22/2018   Chronic diastolic (congestive) heart failure ([place]) 03/22/2018   Other hyperlipidemia 02/15/2018   Vitamin D deficiency 12/31/2017   Class 2 severe obesity with serious comorbidity and body mass index (BMI) of 36.0 to 36.9 in adult ([place]) 11/18/2017   Gait disturbance, post-stroke 04/23/2017   Strain of gluteus medius 04/23/2017   OSA (obstructive sleep apnea) 10/17/2015   Periodic limb movement disorder (PLMD) 10/17/2015   Essential hypertension 05/26/2015  Diabetes mellitus ([place]) 05/26/2015   Abdominal wall mass of right lower quadrant 05/25/2015   Hemiparesis affecting right side as late effect of stroke ([place]) 11/09/2014   Abdominal wall seroma    Sepsis due to undetermined organism ([place]) 03/06/2014   Nausea vomiting and diarrhea 03/06/2014   Sepsis ([place]) 03/06/2014   Tension headache 09/01/2013   Nausea with vomiting ?Gastroparesis? 03/21/2013   Spastic hemiplegia affecting dominant side ([place]) 02/16/2013   Recurrent ventral incisional hernia s/p closure/repair w mesh 02/04/2013 01/26/2013   Constipation, chronic 01/26/2013   Abdominal pain,  chronic, right lower quadrant 06/03/2012   History of CVA (cerebrovascular accident) 06/03/2012   HTN (hypertension) 06/03/2012   Diabetes mellitus type II, uncontrolled ([place]) 10/16/2011   Obesity (BMI 30-39.9) 09/02/2011   Rotator cuff tear 08/25/2011   Sciatica 06/10/2011   Paresthesias in right hand 06/10/2011   Lumbago 05/09/2011   Paresthesias 05/09/2011    Past Surgical History:  Procedure Laterality Date   APPLICATION OF WOUND VAC N/A 05/25/2015   Procedure: APPLICATION OF WOUND VAC;  Surgeon: Michael Boston, MD;  Location: [facility];  Service: General;  Laterality: N/A;   CESAREAN SECTION  2012   DIAGNOSTIC LAPAROSCOPY     ESOPHAGOGASTRODUODENOSCOPY N/A 06/03/2012   Procedure: ESOPHAGOGASTRODUODENOSCOPY (EGD);  Surgeon: [name] Craver, MD;  Location: [facility] ENDOSCOPY;  Service: Endoscopy;  Laterality: N/A;   EXCISION MASS ABDOMINAL N/A 05/25/2015   Procedure: ABDOMINAL WALL EXPLORATION EXCISION OF SEROMA REMOVAL OF REDUNDANT SKIN ;  Surgeon: Michael Boston, MD;  Location: [facility];  Service: General;  Laterality: N/A;   EYE SURGERY     Eye laser for vessel hemorrhaging   fybroid removal     HERNIA REPAIR  10/03/11   ventral hernia repair   INSERTION OF MESH N/A 02/04/2013   Procedure: INSERTION OF MESH;  Surgeon: Adin Hector, MD;  Location: [facility];  Service: General;  Laterality: N/A;   UMBILICAL HERNIA REPAIR N/A 02/04/2013   Procedure: LAPAROSCOPIC ventral wall hernia repair LAPAROSCOPIC LYSIS OF ADHESIONS laparoscopic exploration of abdomen ;  Surgeon: Adin Hector, MD;  Location: [facility];  Service: General;  Laterality: N/A;   URETER REVISION     Bilateral "twisted"   UTERINE FIBROID SURGERY     2 SURGERIES FOR FIBROIDS   VENTRAL HERNIA REPAIR  10/03/2011   Procedure: LAPAROSCOPIC VENTRAL HERNIA;  Surgeon: Adin Hector, MD;  Location: [facility];  Service: General;  Laterality: N/A;     OB History    Gravida  2   Para  2   Term  0   Preterm  1   AB  0     Living  1     SAB  0   TAB  0   Ectopic  0   Multiple  0   Live Births               Home Medications    Prior to Admission medications   Medication Sig Start Date End Date Taking? Authorizing Provider  allopurinol (ZYLOPRIM) 100 MG tablet Take 100 mg by mouth every evening. 03/13/14  Yes [provider]  amLODipine (NORVASC) 10 MG tablet Take 1 tablet (10 mg total) by mouth daily. 11/18/15  Yes Pisciotta, Charna Elizabeth  aspirin 325 MG tablet Take 325 mg by mouth at bedtime.    Yes [provider]  cetirizine (ZYRTEC) 10 MG tablet Take 10 mg by mouth daily as needed for allergies.  08/17/17  Yes [provider]  cloNIDine (CATAPRES) 0.1 MG tablet Take 0.1 mg by mouth 2 (two) times daily. 08/20/17  Yes [provider]  colchicine 0.6 MG tablet Take 0.6 mg by mouth daily as needed (For gout flare-up.).  02/14/14  Yes [provider]  EPINEPHrine 0.3 mg/0.3 mL IJ SOAJ injection INJECT AS DIRECTED AS NEEDED FOR ALLERGIC REACTION Patient taking differently: Inject 0.3 mg into the muscle as needed for anaphylaxis.  09/08/17  Yes Kozlow, Donnamarie Poag, MD  furosemide (LASIX) 40 MG tablet Take 40 mg by mouth daily.    Yes [provider]  gabapentin (NEURONTIN) 600 MG tablet Take 600-1,200 mg by mouth See admin instructions. Take 1 tablets every morning, 1 tablet at noon and 2 tablets every evening    Yes [provider]  hydrochlorothiazide (HYDRODIURIL) 50 MG tablet Take 50 mg by mouth daily.  05/11/17  Yes [provider]  insulin aspart (NOVOLOG) 100 UNIT/ML injection Inject 5-15 Units into the skin 3 (three) times daily with meals. Per sliding scale--pt uses Omnipod and gets a basal rate    Yes [provider]  Insulin Glargine (BASAGLAR KWIKPEN) 100 UNIT/ML SOPN Inject 20 Units into the skin daily.    Yes [provider]  Liraglutide (VICTOZA) 18 MG/3ML SOPN Inject 1.2 mg into the skin daily.   Yes [provider]  mometasone (ELOCON) 0.1 % cream 1-2 times per day Patient taking differently: Apply 1 application topically See admin instructions. 1-2 times per day 09/08/17  Yes Kozlow, Donnamarie Poag, MD  propranolol (INDERAL) 20 MG tablet Take 20 mg by mouth daily.   Yes [provider]  spironolactone (ALDACTONE) 50 MG tablet Take 1 tablet (50 mg total) by mouth daily. 03/22/18 06/20/18 Yes Miquel Dunn, NP  tiZANidine (ZANAFLEX) 2 MG tablet TAKE 1 TABLET AT BEDTIME DAILY. CAN TAKE 1/2 TABLET IN THE AM AS NEEDED Patient taking differently: Take 1-2 mg by mouth See admin instructions. Take 2mg  at bedtime daily. Can take 1mg  in the morning as needed for muscle spasms. 03/23/18  Yes Kirsteins, Luanna Salk, MD  traMADol (ULTRAM) 50 MG tablet TAKE 1 TABLET BY MOUTH 3 TIMES A DAY AS NEEDED Patient taking differently: Take 50 mg by mouth 3 (three) times daily.  04/01/18  Yes Kirsteins, Luanna Salk, MD  Vitamin D, Ergocalciferol, (DRISDOL) 1.25 MG (50000 UT) CAPS capsule Take 1 capsule (50,000 Units total) by mouth every 7 (seven) days. 04/08/18  Yes Whitmire, Dawn W, FNP  atorvastatin (LIPITOR) 10 MG tablet TAKE 1 TABLET BY MOUTH EVERY DAY Patient not taking: Reported on 04/20/2018 04/12/18   Whitmire, Joneen Boers, FNP  ondansetron (ZOFRAN) 4 MG tablet Take 1 tablet (4 mg total) by mouth every 8 (eight) hours as needed for up to 20 doses for nausea or vomiting. 04/20/18   Lennice Sites, DO    Family History Family History  Problem Relation Age of Onset   Diabetes Father    Kidney disease Father    Depression Father    Drug abuse Father    Allergic rhinitis Mother    Eczema Mother    Urticaria Mother    Depression Mother    Anxiety disorder Mother    Bipolar disorder Mother    Alcoholism Mother    Drug abuse Mother    Eating disorder Mother    Diabetes Maternal Grandmother    Hyperlipidemia Paternal Grandmother    Stroke Paternal Grandmother    Eczema Sister    Urticaria Sister  Colon cancer Paternal Uncle    Other Neg Hx    Angioedema Neg Hx    Asthma Neg Hx     Social History Social History   Tobacco Use   Smoking status: Never Smoker   Smokeless tobacco: Never Used  Substance Use Topics   Alcohol use: No    Alcohol/week: 0.0 standard drinks   Drug use: No     Allergies   Contrast media [iodinated diagnostic agents]; Iohexol; Midazolam hcl; Shellfish allergy; Valsartan; Metformin and related; Other; Avandia [rosiglitazone maleate]; Geodon [ziprasidone]; Kiwi extract; and Latex   Review of Systems Review of Systems  Constitutional: Negative for chills and fever.  HENT: Negative for ear pain and sore throat.   Eyes: Negative for pain and visual disturbance.  Respiratory: Negative for cough and shortness of breath.   Cardiovascular: Negative for chest pain and palpitations.  Gastrointestinal: Positive for abdominal pain, diarrhea, hematochezia (blood intermixed with stool ), nausea and vomiting. Negative for anorexia, constipation and melena.  Genitourinary: Positive for vaginal bleeding (on menstraul cycle). Negative for difficulty urinating, dysuria, hematuria, pelvic pain and vaginal discharge.  Musculoskeletal: Negative for arthralgias and back pain.  Skin: Negative for color change and rash.  Neurological: Negative for seizures and syncope.  All other systems reviewed and are negative.    Physical Exam Updated Vital Signs  ED Triage Vitals  Enc Vitals Group     BP 04/20/18 1525 (!) 170/82     Pulse Rate 04/20/18 1525 (!) 112     Resp 04/20/18 1525 18     Temp 04/20/18 1525 98.7 F (37.1 C)     Temp Source 04/20/18 1525 Oral     SpO2 04/20/18 1525 100 %     Weight --      Height --      Head Circumference --      Peak Flow --      Pain Score 04/20/18 1523 10     Pain Loc --      Pain Edu? --      Excl. in [employer]? --     Physical Exam Vitals signs and nursing note reviewed.  Constitutional:      General: She is in  acute distress.     Appearance: She is well-developed. She is obese. She is not ill-appearing.  HENT:     Head: Normocephalic and atraumatic.  Eyes:     Conjunctiva/sclera: Conjunctivae normal.  Neck:     Musculoskeletal: Neck supple.  Cardiovascular:     Rate and Rhythm: Normal rate and regular rhythm.     Heart sounds: Normal heart sounds. No murmur.  Pulmonary:     Effort: Pulmonary effort is normal. No respiratory distress.     Breath sounds: Normal breath sounds.  Abdominal:     General: There is distension.     Palpations: Abdomen is soft.     Tenderness: There is generalized abdominal tenderness. There is guarding. There is no right CVA tenderness, left CVA tenderness or rebound. Negative signs include Murphy's sign, Rovsing's sign and psoas sign.     Hernia: No hernia is present.  Genitourinary:    Rectum: Guaiac result positive (red blood intermixed with brown stool).  Skin:    General: Skin is warm and dry.     Capillary Refill: Capillary refill takes less than 2 seconds.  Neurological:     Mental Status: She is alert.      ED Treatments / Results  Labs (all labs ordered are  listed, but only abnormal results are displayed) Labs Reviewed  COMPREHENSIVE METABOLIC PANEL - Abnormal; Notable for the following components:      Result Value   Glucose, Bld 240 (*)    BUN 22 (*)    Creatinine, Ser 1.11 (*)    GFR calc non Af Amer 59 (*)    All other components within normal limits  CBC - Abnormal; Notable for the following components:   WBC 13.1 (*)    RBC 5.26 (*)    MCH 25.7 (*)    All other components within normal limits  URINALYSIS, ROUTINE W REFLEX MICROSCOPIC - Abnormal; Notable for the following components:   Glucose, UA >=500 (*)    Hgb urine dipstick LARGE (*)    Protein, ur 100 (*)    RBC / HPF >50 (*)    Bacteria, UA RARE (*)    All other components within normal limits  LACTIC ACID, PLASMA - Abnormal; Notable for the following components:   Lactic  Acid, Venous 2.2 (*)    All other components within normal limits  LACTIC ACID, PLASMA - Abnormal; Notable for the following components:   Lactic Acid, Venous 2.2 (*)    All other components within normal limits  POC OCCULT BLOOD, ED - Abnormal; Notable for the following components:   Fecal Occult Bld POSITIVE (*)    All other components within normal limits  LIPASE, BLOOD  I-STAT BETA HCG BLOOD, ED ([facility], [facility], [facility] ONLY)    EKG None  Radiology Ct Abdomen Pelvis Wo Contrast  Result Date: 04/20/2018 CLINICAL DATA:  Abdominal pain with nausea and vomiting. Acute onset rectal bleeding EXAM: CT ABDOMEN AND PELVIS WITHOUT CONTRAST TECHNIQUE: Multidetector CT imaging of the abdomen and pelvis was performed following the standard protocol without oral or IV contrast. COMPARISON:  November 26, 2016 FINDINGS: Lower chest: Lung bases are clear. Hepatobiliary: No focal liver lesions are apparent on this noncontrast enhanced study. There is mild fatty infiltration near the fissure for the ligamentum teres. Gallbladder wall is not appreciably thickened. There is no biliary duct dilatation. Pancreas: No pancreatic mass or inflammatory focus. Spleen: No splenic lesions are evident. Adrenals/Urinary Tract: Adrenals bilaterally appear unremarkable. There is scarring in the left kidney, stable. There is no evident renal mass or hydronephrosis on either side. There is no appreciable renal or ureteral calculus on either side. Urinary bladder is midline with wall thickness within normal limits. Stomach/Bowel: There is no appreciable bowel wall or mesenteric thickening. No evident bowel obstruction. No perirectal soft tissue stranding or fistula. No rectal wall thickening evident. No free air or portal venous air evident. Vascular/Lymphatic: There is mild calcification at the level of the aortic bifurcation and in the proximal right common iliac artery. No aneurysm evident. No adenopathy is appreciable in the abdomen or  pelvis. Reproductive: The uterus is anteverted. The uterus is enlarged and lobulated in appearance consistent with leiomyomatous change. There is a calcified leiomyoma in the leftward superior aspect of the uterus measuring approximately 2 x 1.5 cm. Uterus impresses upon the superior aspect of the urinary bladder. No extrauterine pelvic mass is appreciable on this study. Other: There is postoperative change along the abdominal wall with mesh noted, a stable finding. There remains a probable small hematoma along the right anterior abdominal wall measuring 3.0 x 2.3 cm, slightly smaller than on previous study. No new abdominal wall lesions evident. There is scarring in the anterior abdominal wall which appears stable. Appendix region appears unremarkable. No evident  abscess or ascites in the abdomen or pelvis. Musculoskeletal: No blastic or lytic bone lesions. No intramuscular lesions are evident. IMPRESSION: 1. No bowel obstruction. No abscess in the abdomen or pelvis. No periappendiceal region inflammation. Note that a cause for rectal bleeding is not been established with this study. 2. Scarring left kidney. No hydronephrosis. No renal or ureteral calculus on either side. 3. Postoperative change involving the anterior and pelvic wall regions. Fluid collection along the right anterior abdominal wall is smaller compared to the previous study and likely represents partially but incompletely resolved hematoma. 4. Enlarged, leiomyomatous uterus, essentially stable in appearance compared to prior study. Electronically Signed   By: Lowella Grip III M.D.   On: 04/20/2018 17:48    Procedures Procedures (including critical care time)  Medications Ordered in ED Medications  sodium chloride flush (NS) 0.9 % injection 3 mL (3 mLs Intravenous Not Given 04/20/18 1612)  sodium chloride 0.9 % bolus 1,000 mL (0 mLs Intravenous Stopped 04/20/18 1729)  fentaNYL (SUBLIMAZE) injection 50 mcg (50 mcg Intravenous Given 04/20/18  1632)  ondansetron (ZOFRAN) injection 4 mg (4 mg Intravenous Given 04/20/18 1728)  fentaNYL (SUBLIMAZE) injection 50 mcg (50 mcg Intravenous Given 04/20/18 1728)     Initial Impression / Assessment and Plan / ED Course  I have reviewed the triage vital signs and the nursing notes.  Pertinent labs & imaging results that were available during my care of the patient were reviewed by me and considered in my medical decision making (see chart for details).     Tina [name] is a 48 year old female with history of depression, anxiety, diabetes, ventral hernia repair who presents to the ED with abdominal pain.  Patient with overall unremarkable vitals.  No fever.  Patient with nausea, vomiting, abdominal pain, diarrhea.  Currently on her menstrual cycle and has history of fibroids and gets bad abdominal pain with that as well.  Unsure whether it is fibroids or GI bug.  No suspicious food intake.  Patient is mostly tender in the left lower and left upper quadrant on abdominal exam.  But does have some diffuse tenderness throughout.  She states that she is passing gas.  She said she had 1 stool with some intermixed blood.  No obvious hemorrhoids on exam.  Stool is brown with some intermixed blood.  Hemoglobin is stable.  Patient with mild leukocytosis likely from stress reaction.  Patient with unremarkable electrolytes.  Lactic acid is mildly elevated at 2.2 likely secondary to dehydration.  Urinalysis showed no signs of infection.  CT scan showed no infectious findings.  Overall there are no acute findings on CT scan.  There is a large fibroid in the left side of her uterus that is likely contributing to her pain today.  There is no signs of colitis or appendicitis.  No bowel obstruction.  There is no inflammation of the mesentery. Patient felt better after IV Zofran, IV fluids, IV fentanyl.  She states that she has talked to [facility] about a hysterectomy in the past but they have wanted to hold off on that due to  having a history of hernia repair with mesh.  Surgery would be overall complicated.  She is not on any birth control pills and has not wanted to be on any in the past.  Pt already on tramadol at home.  Will prescribe Zofran for nausea.  Recommend Tylenol for pain as well.  Patient likely with rectal bleeding secondary to hemorrhoid for polyp.  Has had colonoscopy  in the past in [place].  Does not have a GI doctor here.  Will give information to follow-up with GI because patient may benefit from colonoscopy.  Told her that if bleeding continues and she becomes more symptomatic with shortness of breath, chest pain, fatigue that she should return to the ED for evaluation.  Overall patient with no infectious process going on at this time.  Likely pain secondary to menstrual cycle/fibroid.  Recommend follow-up with OB to discuss any further options.  Given return precautions.  This chart was dictated using voice recognition software.  Despite best efforts to proofread,  errors can occur which can change the documentation meaning.    Final Clinical Impressions(s) / ED Diagnoses   Final diagnoses:  Abdominal pain, unspecified abdominal location  Fibroid  Rectal bleeding    ED Discharge Orders         Ordered    naproxen (NAPROSYN) 500 MG tablet  2 times daily,   Status:  Discontinued     04/20/18 1848    ondansetron (ZOFRAN) 4 MG tablet  Every 8 hours PRN     04/20/18 1947           Lennice Sites, DO 04/20/18 1947

## 2018-04-20 NOTE — ED Notes (Signed)
Patient in CT

## 2018-04-20 NOTE — ED Triage Notes (Signed)
Pt c/o abdominal pain with nausea/vomiting/diarrhea that started last night. Pt reports that she developed rectal bleeding today. Hx diabetes, reports blood sugars have been reading high.

## 2018-04-20 NOTE — ED Notes (Signed)
Pt's spouse Richardson Landry, call with updates: [phone]

## 2018-04-20 NOTE — Discharge Instructions (Addendum)
Follow-up with your [name] doctor as discussed.  Follow up with GI doctor as well. Return to the ED symptoms worsen, including worsening rectal bleeding.

## 2018-04-20 NOTE — ED Notes (Signed)
Urine culture sent down to main lab 

## 2018-04-26 ENCOUNTER — Other Ambulatory Visit: Payer: Self-pay

## 2018-04-26 ENCOUNTER — Ambulatory Visit (INDEPENDENT_AMBULATORY_CARE_PROVIDER_SITE_OTHER): Payer: HMO | Admitting: Gastroenterology

## 2018-04-26 ENCOUNTER — Encounter (INDEPENDENT_AMBULATORY_CARE_PROVIDER_SITE_OTHER): Payer: Self-pay | Admitting: Family Medicine

## 2018-04-26 ENCOUNTER — Ambulatory Visit (INDEPENDENT_AMBULATORY_CARE_PROVIDER_SITE_OTHER): Payer: HMO | Admitting: Family Medicine

## 2018-04-26 ENCOUNTER — Telehealth: Payer: Self-pay

## 2018-04-26 DIAGNOSIS — K760 Fatty (change of) liver, not elsewhere classified: Secondary | ICD-10-CM

## 2018-04-26 DIAGNOSIS — Z794 Long term (current) use of insulin: Secondary | ICD-10-CM | POA: Diagnosis not present

## 2018-04-26 DIAGNOSIS — E1140 Type 2 diabetes mellitus with diabetic neuropathy, unspecified: Secondary | ICD-10-CM | POA: Diagnosis not present

## 2018-04-26 DIAGNOSIS — K625 Hemorrhage of anus and rectum: Secondary | ICD-10-CM

## 2018-04-26 DIAGNOSIS — E66812 Morbid (severe) obesity due to excess calories: Secondary | ICD-10-CM

## 2018-04-26 DIAGNOSIS — K5900 Constipation, unspecified: Secondary | ICD-10-CM

## 2018-04-26 DIAGNOSIS — R109 Unspecified abdominal pain: Secondary | ICD-10-CM | POA: Diagnosis not present

## 2018-04-26 DIAGNOSIS — Z6836 Body mass index (BMI) 36.0-36.9, adult: Secondary | ICD-10-CM

## 2018-04-26 MED ORDER — SUPREP BOWEL PREP KIT 17.5-3.13-1.6 GM/177ML PO SOLN: ORAL | 0 refills | Status: DC

## 2018-04-26 MED ORDER — OMEPRAZOLE 40 MG PO CPDR: 40.0000 mg | DELAYED_RELEASE_CAPSULE | Freq: Every day | ORAL | 3 refills | Status: DC

## 2018-04-26 NOTE — Telephone Encounter (Signed)
Called and scheduled pt for Thursday, 4-16. Pt has MyChart. Will instruct over the phone.

## 2018-04-26 NOTE — Progress Notes (Signed)
Office: [phone]  /  Fax: [phone] TeleHealth Visit:  Tina [name] has verbally consented to this TeleHealth visit today. The patient is located at home, the provider is located at the [employer] [facility] office. The participants in this visit include the listed provider and patient. The visit was conducted today via telephone call.  HPI:   Chief Complaint: OBESITY Tina [name] is here to discuss her progress with her obesity treatment plan. She is on the Category 3 plan and is following her eating plan approximately 70% of the time. She states she is exercising 0 minutes 0 times per week. Tina [name] 's visit was done by telephone call as she was unable to connect with Skype or Webex. She states she has been having rectal bleeding and has a colonoscopy scheduled. She states she has not weighed recently but feels she has maintained her weight. Tina [name] reports she is doing art to relieve stress and this keeps her mind off of eating. She is not eating all of her protein because of availability. We were unable to weigh the patient today for this TeleHealth visit. She feels as if she has maintained her weight since her last visit. She has lost 5 lbs since starting treatment with [name].  Diabetes Mellitus with  Neuropathy on Insulin Tina [name] has a diagnosis of diabetes mellitus. Tina [name] states blood sugars are widely variable with recent abdominal pain, nausea, vomiting, and rectal bleeding. She denies any hypoglycemic episodes. Last A1c was reported to be 8.5 on 02/15/2018. She is on sliding scale and basal insulin 20 units. She reports sugars are running high with the highest at 253; low sugars are in the range of 95 and 120. She has been working on intensive lifestyle modifications including diet, exercise, and weight loss to help control her blood glucose levels.  ASSESSMENT AND PLAN:  Type 2 diabetes mellitus with diabetic neuropathy, with long-term current use of insulin (HCC)  Class 2  severe obesity with serious comorbidity and body mass index (BMI) of 36.0 to 36.9 in adult, unspecified obesity type (Tina [name])  PLAN:  Diabetes Mellitus with  Neuropathy on Insulin Tina [name] has been given extensive diabetes education by myself today including ideal fasting and post-prandial blood glucose readings, individual ideal HgA1c goals  and hypoglycemia prevention. We discussed the importance of good blood sugar control to decrease the likelihood of diabetic complications such as nephropathy, neuropathy, limb loss, blindness, coronary artery disease, and death. We discussed the importance of intensive lifestyle modification including diet, exercise and weight loss as the first line treatment for diabetes. Tina [name] agrees to continue her diabetes medications, continue to monitor her CBG's, and will follow-up at the agreed upon time.  I spent > than 50% of the 15 minute visit on counseling as documented in the note.  Obesity Tina [name] is currently in the action stage of change. As such, her goal is to continue with weight loss efforts. She has agreed to follow the Category 3 plan. We discussed the following Behavioral Modification Strategies today: increasing lean protein intake, emotional eating strategies, and planning for success. Tina [name] has not been prescribed exercise at this time. Tina [name] has agreed to follow-up with our clinic in 2 weeks. She was informed of the importance of frequent follow-up visits to maximize her success with intensive lifestyle modifications for her multiple health conditions.  ALLERGIES: Allergies  Allergen Reactions   Contrast Media [Iodinated Diagnostic Agents] Other (See Comments)    Difficulty breathing   Iohexol Hives, Nausea And Vomiting and Swelling  Desc: Magnevist-gadolinium-difficulty breathing, throat swelling    Midazolam Hcl Anaphylaxis    Difficulty breathing   Shellfish Allergy Anaphylaxis   Valsartan Swelling   Metformin And Related  Diarrhea and Nausea And Vomiting   Other Itching    Patient is allergic to all nuts except peanuts.    Avandia [Rosiglitazone Maleate] Hives and Other (See Comments)   Geodon [Ziprasidone] Other (See Comments)    UNKNOWN   Kiwi Extract Itching and Swelling   Latex Itching    MEDICATIONS: Current Outpatient Medications on File Prior to Visit  Medication Sig Dispense Refill   allopurinol (ZYLOPRIM) 100 MG tablet Take 100 mg by mouth every evening.     amLODipine (NORVASC) 10 MG tablet Take 1 tablet (10 mg total) by mouth daily. 30 tablet 0   aspirin 325 MG tablet Take 325 mg by mouth at bedtime.      atorvastatin (LIPITOR) 10 MG tablet TAKE 1 TABLET BY MOUTH EVERY DAY (Patient not taking: Reported on 04/20/2018) 30 tablet 0   cetirizine (ZYRTEC) 10 MG tablet Take 10 mg by mouth daily as needed for allergies.   11   cloNIDine (CATAPRES) 0.1 MG tablet Take 0.1 mg by mouth 2 (two) times daily.  0   colchicine 0.6 MG tablet Take 0.6 mg by mouth daily as needed (For gout flare-up.).   1   EPINEPHrine 0.3 mg/0.3 mL IJ SOAJ injection INJECT AS DIRECTED AS NEEDED FOR ALLERGIC REACTION (Patient taking differently: Inject 0.3 mg into the muscle as needed for anaphylaxis. ) 1 Device 2   furosemide (LASIX) 40 MG tablet Take 40 mg by mouth daily.      gabapentin (NEURONTIN) 600 MG tablet Take 600-1,200 mg by mouth See admin instructions. Take 1 tablets every morning, 1 tablet at noon and 2 tablets every evening      hydrochlorothiazide (HYDRODIURIL) 50 MG tablet Take 50 mg by mouth daily.      insulin aspart (NOVOLOG) 100 UNIT/ML injection Inject 5-15 Units into the skin 3 (three) times daily with meals. Per sliding scale--pt uses Omnipod and gets a basal rate      Insulin Glargine (BASAGLAR KWIKPEN) 100 UNIT/ML SOPN Inject 20 Units into the skin daily.      Liraglutide (VICTOZA) 18 MG/3ML SOPN Inject 1.2 mg into the skin daily.     mometasone (ELOCON) 0.1 % cream 1-2 times per day  (Patient taking differently: Apply 1 application topically See admin instructions. 1-2 times per day) 45 g 1   ondansetron (ZOFRAN) 4 MG tablet Take 1 tablet (4 mg total) by mouth every 8 (eight) hours as needed for up to 20 doses for nausea or vomiting. 20 tablet 0   propranolol (INDERAL) 20 MG tablet Take 20 mg by mouth daily.     spironolactone (ALDACTONE) 50 MG tablet Take 1 tablet (50 mg total) by mouth daily. 90 tablet 3   tiZANidine (ZANAFLEX) 2 MG tablet TAKE 1 TABLET AT BEDTIME DAILY. CAN TAKE 1/2 TABLET IN THE AM AS NEEDED (Patient taking differently: Take 1-2 mg by mouth See admin instructions. Take 2mg  at bedtime daily. Can take 1mg  in the morning as needed for muscle spasms.) 135 tablet 1   traMADol (ULTRAM) 50 MG tablet TAKE 1 TABLET BY MOUTH 3 TIMES A DAY AS NEEDED (Patient taking differently: Take 50 mg by mouth 3 (three) times daily. ) 90 tablet 3   Vitamin D, Ergocalciferol, (DRISDOL) 1.25 MG (50000 UT) CAPS capsule Take 1 capsule (50,000 Units total) by  mouth every 7 (seven) days. 4 capsule 0   No current facility-administered medications on file prior to visit.     PAST MEDICAL HISTORY: Past Medical History:  Diagnosis Date   Anemia    DURING MENSES--HAS HEAVY BLEEDING WITH PERODS   Anxiety    Back pain, chronic    "ongoing"   Blood transfusion    IN 2012  AFTER C -SECTION   Cerebral thrombosis with cerebral infarction [facility]) JUNE 2011   RIGHT SIDED WEAKNESS ( ARM AND LEG ) AND SPASMS-remains with slight weakness and vertigo.   Constipation    Depression    Diabetes mellitus    Diabetic neuropathy ([place])    BOTH FEET --COMES AND GOES   Edema, lower extremity    Endometriosis    Fatty liver    H/O eye surgery    Headache(784.0)    MIGRAINES--NOT REALLY HEADACHE-MORE LIKE PRESSURE SENSATION IN HEAD-FEELS DIZZIY AND  FAINT AS THE PRESSURE RESOLVES   Hernia, incisional, RLQ, s/p lap repair Sep 2013 09/02/2011   History of vertigo 03/22/2018   Hx  of migraines 10/19/2011   Hypertension    Leg pain, right    "like bad Charley horse"   Multiple food allergies    Panic disorder without agoraphobia    Rash    HANDS, ARMS --STATES HX OF RASH EVER SINCE CHILDBIRTH/PREGNANCY.  STATES THE RASH OFTEN OCCURS WHEN SHE IS REALLY STRESSED."goes and comes-presently left ring finger"   Restless leg syndrome    DIAGNOSED BY SLEEP STUDY - PT TOLD SHE DID NOT HAVE SLEEP APNEA   Right rotator cuff tear    PAIN IN RIGHT SHOULDER   SBO (small bowel obstruction) ([place]) 06/03/2012   Shortness of breath    Spastic hemiplegia affecting dominant side (HCC)    Stomach problems    Stroke (HCC)    Ventral hernia    RIGHT LOWER QUADRANT-CAUSING SOME PAIN   Weakness of right side of body     PAST SURGICAL HISTORY: Past Surgical History:  Procedure Laterality Date   APPLICATION OF WOUND VAC N/A 05/25/2015   Procedure: APPLICATION OF WOUND VAC;  Surgeon: Michael Boston, MD;  Location: [facility];  Service: General;  Laterality: N/A;   CESAREAN SECTION  2012   DIAGNOSTIC LAPAROSCOPY     ESOPHAGOGASTRODUODENOSCOPY N/A 06/03/2012   Procedure: ESOPHAGOGASTRODUODENOSCOPY (EGD);  Surgeon: [name] Craver, MD;  Location: [facility]COPY;  Service: Endoscopy;  Laterality: N/A;   EXCISION MASS ABDOMINAL N/A 05/25/2015   Procedure: ABDOMINAL WALL EXPLORATION EXCISION OF SEROMA REMOVAL OF REDUNDANT SKIN ;  Surgeon: Michael Boston, MD;  Location: [facility];  Service: General;  Laterality: N/A;   EYE SURGERY     Eye laser for vessel hemorrhaging   fybroid removal     HERNIA REPAIR  10/03/11   ventral hernia repair   INSERTION OF MESH N/A 02/04/2013   Procedure: INSERTION OF MESH;  Surgeon: Adin Hector, MD;  Location: [facility];  Service: General;  Laterality: N/A;   UMBILICAL HERNIA REPAIR N/A 02/04/2013   Procedure: LAPAROSCOPIC ventral wall hernia repair LAPAROSCOPIC LYSIS OF ADHESIONS laparoscopic exploration of abdomen ;  Surgeon: Adin Hector, MD;  Location:  [facility];  Service: General;  Laterality: N/A;   URETER REVISION     Bilateral "twisted"   UTERINE FIBROID SURGERY     2 SURGERIES FOR FIBROIDS   VENTRAL HERNIA REPAIR  10/03/2011   Procedure: LAPAROSCOPIC VENTRAL HERNIA;  Surgeon: Adin Hector, MD;  Location: [facility];  Service: General;  Laterality: N/A;    SOCIAL HISTORY: Social History   Tobacco Use   Smoking status: Never Smoker   Smokeless tobacco: Never Used  Substance Use Topics   Alcohol use: No    Alcohol/week: 0.0 standard drinks   Drug use: No    FAMILY HISTORY: Family History  Problem Relation Age of Onset   Diabetes Father    Kidney disease Father    Depression Father    Drug abuse Father    Allergic rhinitis Mother    Eczema Mother    Urticaria Mother    Depression Mother    Anxiety disorder Mother    Bipolar disorder Mother    Alcoholism Mother    Drug abuse Mother    Eating disorder Mother    Diabetes Maternal Grandmother    Hyperlipidemia Paternal Grandmother    Stroke Paternal Grandmother    Eczema Sister    Urticaria Sister    Colon cancer Paternal Uncle    Other Neg Hx    Angioedema Neg Hx    Asthma Neg Hx    ROS: Review of Systems  Gastrointestinal: Positive for abdominal pain, blood in stool, nausea and vomiting.  Endo/Heme/Allergies:       Negative for hypoglycemia.   PHYSICAL EXAM: Pt in no acute distress  RECENT LABS AND TESTS: BMET    Component Value Date/Time   NA 137 04/20/2018 1537   NA 141 11/03/2017 1310   K 3.6 04/20/2018 1537   CL 99 04/20/2018 1537   CO2 25 04/20/2018 1537   GLUCOSE 240 (H) 04/20/2018 1537   BUN 22 (H) 04/20/2018 1537   BUN 11 11/03/2017 1310   CREATININE 1.11 (H) 04/20/2018 1537   CALCIUM 10.1 04/20/2018 1537   GFRNONAA 59 (L) 04/20/2018 1537   GFRAA >60 04/20/2018 1537   Lab Results  Component Value Date   HGBA1C 8.5 (H) 02/15/2018   HGBA1C 8.4 (H) 11/03/2017   HGBA1C 7.5 (H) 05/18/2015   HGBA1C 8.7 (H)  02/05/2013   HGBA1C 6.9 (H) 06/03/2012   No results found for: INSULIN CBC    Component Value Date/Time   WBC 13.1 (H) 04/20/2018 1537   RBC 5.26 (H) 04/20/2018 1537   HGB 13.5 04/20/2018 1537   HCT 42.4 04/20/2018 1537   PLT 348 04/20/2018 1537   MCV 80.6 04/20/2018 1537   MCH 25.7 (L) 04/20/2018 1537   MCHC 31.8 04/20/2018 1537   RDW 14.6 04/20/2018 1537   LYMPHSABS 1.3 05/11/2017 1323   MONOABS 0.6 05/11/2017 1323   EOSABS 0.2 05/11/2017 1323   BASOSABS 0.0 05/11/2017 1323   Iron/TIBC/Ferritin/ %Sat    Component Value Date/Time   IRON 114 11/01/2015 1031   TIBC 372 11/01/2015 1031   FERRITIN 35 11/01/2015 1031   IRONPCTSAT 31 11/01/2015 1031   Lipid Panel     Component Value Date/Time   CHOL 169 02/15/2018 1034   TRIG 168 (H) 02/15/2018 1034   HDL 41 02/15/2018 1034   CHOLHDL 6.0 10/17/2011 0315   VLDL 59 (H) 10/17/2011 0315   LDLCALC 94 02/15/2018 1034   Hepatic Function Panel     Component Value Date/Time   PROT 8.0 04/20/2018 1537   PROT 6.8 11/03/2017 1310   ALBUMIN 3.7 04/20/2018 1537   ALBUMIN 3.8 11/03/2017 1310   AST 18 04/20/2018 1537   ALT 14 04/20/2018 1537   ALKPHOS 60 04/20/2018 1537   BILITOT 0.8 04/20/2018 1537   BILITOT 0.3 11/03/2017 1310   BILIDIR <0.1  06/03/2012 0220   IBILI NOT CALCULATED 06/03/2012 0220      Component Value Date/Time   TSH 0.947 11/03/2017 1310   TSH 2.020 09/08/2017 1129   TSH 1.045 06/03/2012 0520   Results for NOVIS, LEAGUE (MRN [id number]) as of 04/26/2018 16:08  Ref. Range 02/15/2018 10:34  Vitamin D, 25-Hydroxy Latest Ref Range: 30.0 - 100.0 ng/mL 26.5 (L)   I, Michaelene Song, am acting as Location manager for [employer], FNP-C.  I have reviewed the above documentation for accuracy and completeness, and I agree with the above.  - Geordan Xu, FNP-C.

## 2018-04-26 NOTE — Progress Notes (Signed)
Pt scheduled for ECL on Thursday, 4-16 with [facility]. amb referral and suprep sent

## 2018-04-26 NOTE — Telephone Encounter (Signed)
-----   Message from Yetta Flock, MD sent at 04/26/2018 12:19 PM EDT ----- Regarding: EGD and colon Hey Jan, This patient needs a double. Not sure if we have any room to put her with me this Thursday, she wants to proceed in the next 1-2 weeks. If not me, then someone else who has an opening. Thanks Will discuss with you

## 2018-04-26 NOTE — Progress Notes (Signed)
Virtual Visit via Video Note  I connected with Tina [name] on 04/26/18 at 11:00 AM EDT by a video enabled telemedicine application and verified that I am speaking with the correct person using two identifiers.   I discussed the limitations of evaluation and management by telemedicine and the availability of in person appointments. The patient expressed understanding and agreed to proceed.  THIS ENCOUNTER IS A VIRTUAL VISIT DUE TO COVID-19 - PATIENT WAS NOT SEEN IN THE OFFICE. PATIENT HAS CONSENTED TO VIRTUAL VISIT / TELEMEDICINE VISIT   Location of patient: home Location of provider: office Name of referring provider: [name], DO Persons participating: myself, patient   HPI :  48 y/o female with a history of anemia, CVA 2011, DM, referred here by [name] DO for abdominal pain and change in bowel habits.  She reports she developed new onset pain, constipation, and rectal bleeding on April 6th. Symptoms appeared to start acutely. She felt pain in her epigastric area, which radiated into her right lower quadrant and into her rectum as well. During this time she was not able to have a bowel movement for a few days. When she did have a BM she had a hard stool and multiple episodes of passing bright red blood per rectum on the first day when symptoms started. Red blood filled the toilet. She did not think this was menstrual blood loss. This occurred 2 days of bleeding total in the stool. Symptoms started on the 6th.  Since this occurred she has not had any significant bleeding, but continues to see a small amount in her stool. She has not used the bathroom since the ED visit. Constipation is typically rare for her, usually 2-3 BMs per day baseline. Constipation started the day prior to her symptoms of pain and bleeding. She went to the ED on the 6th and had a CT scan showing no acute changes. She's had some soreness in her abdomen since that visit. She feels some discomfort in her  epigastric area, nausea, after she eats. She also feels pain in her lower abdomen with this. She is not having much reflux symptoms that bother her. No routine dysphagia. Prior to episode no eating problems. She has had 2 hernia repairs 3 years ago. She has a stable small hematoma from that noted on her CT scan.  She has not been using any NSAIDs. Uses tramadol BID and gabapentin for pain on right side following a stroke. She reports having a CVA in 2011, she can ambulate for 15 minutes prior to getting weak. She takes aspirin 325mg  per day. No other blood thinners. She has had rectal bleeding about 12 years ago, she had a colonoscopy done and she thinks it looked normal at the tie, no details of this available.  Uncle diagnosed with colon cancer.   No alcohol use. No history of liver disease she is aware of. She has had fatty liver on prior imaging studies.. She is working with a nutritionist, she thinks weight has been going down and doing better  CT scan 04/20/18 - normal bowel, post-op change with resolving hematoma? in the pelvis, enlarged uterus, fatty liver  EGD 06/03/2012 - Dr. Collene Mares - done for hematemesis - normal exam with gastric erythema presumed to be due to NG tube placement   Past Medical History:  Diagnosis Date  . Anemia    DURING MENSES--HAS HEAVY BLEEDING WITH PERODS  . Anxiety   . Back pain, chronic    "ongoing"  .  Blood transfusion    IN 2012  AFTER C -SECTION  . Cerebral thrombosis with cerebral infarction [facility]) JUNE 2011   RIGHT SIDED WEAKNESS ( ARM AND LEG ) AND SPASMS-remains with slight weakness and vertigo.  . Constipation   . Depression   . Diabetes mellitus   . Diabetic neuropathy ([place])    BOTH FEET --COMES AND GOES  . Edema, lower extremity   . Endometriosis   . Fatty liver   . H/O eye surgery   . Headache(784.0)    MIGRAINES--NOT REALLY HEADACHE-MORE LIKE PRESSURE SENSATION IN HEAD-FEELS DIZZIY AND  FAINT AS THE PRESSURE RESOLVES  . Hernia, incisional,  RLQ, s/p lap repair Sep 2013 09/02/2011  . History of vertigo 03/22/2018  . Hx of migraines 10/19/2011  . Hypertension   . Leg pain, right    "like bad Charley horse"  . Multiple food allergies   . Panic disorder without agoraphobia   . Rash    HANDS, ARMS --STATES HX OF RASH EVER SINCE CHILDBIRTH/PREGNANCY.  STATES THE RASH OFTEN OCCURS WHEN SHE IS REALLY STRESSED."goes and comes-presently left ring finger"  . Restless leg syndrome    DIAGNOSED BY SLEEP STUDY - PT TOLD SHE DID NOT HAVE SLEEP APNEA  . Right rotator cuff tear    PAIN IN RIGHT SHOULDER  . SBO (small bowel obstruction) ([place]) 06/03/2012  . Shortness of breath   . Spastic hemiplegia affecting dominant side ([place])   . Stomach problems   . Stroke ([place])   . Ventral hernia    RIGHT LOWER QUADRANT-CAUSING SOME PAIN  . Weakness of right side of body      Past Surgical History:  Procedure Laterality Date  . APPLICATION OF WOUND VAC N/A 05/25/2015   Procedure: APPLICATION OF WOUND VAC;  Surgeon: Michael Boston, MD;  Location: [facility];  Service: General;  Laterality: N/A;  . CESAREAN SECTION  2012  . DIAGNOSTIC LAPAROSCOPY    . ESOPHAGOGASTRODUODENOSCOPY N/A 06/03/2012   Procedure: ESOPHAGOGASTRODUODENOSCOPY (EGD);  Surgeon: [name] Craver, MD;  Location: [facility] ENDOSCOPY;  Service: Endoscopy;  Laterality: N/A;  . EXCISION MASS ABDOMINAL N/A 05/25/2015   Procedure: ABDOMINAL WALL EXPLORATION EXCISION OF SEROMA REMOVAL OF REDUNDANT SKIN ;  Surgeon: Michael Boston, MD;  Location: [facility];  Service: General;  Laterality: N/A;  . EYE SURGERY     Eye laser for vessel hemorrhaging  . fybroid removal    . HERNIA REPAIR  10/03/11   ventral hernia repair  . INSERTION OF MESH N/A 02/04/2013   Procedure: INSERTION OF MESH;  Surgeon: Adin Hector, MD;  Location: [facility];  Service: General;  Laterality: N/A;  . UMBILICAL HERNIA REPAIR N/A 02/04/2013   Procedure: LAPAROSCOPIC ventral wall hernia repair LAPAROSCOPIC LYSIS OF ADHESIONS laparoscopic  exploration of abdomen ;  Surgeon: Adin Hector, MD;  Location: [facility];  Service: General;  Laterality: N/A;  . URETER REVISION     Bilateral "twisted"  . UTERINE FIBROID SURGERY     2 SURGERIES FOR FIBROIDS  . VENTRAL HERNIA REPAIR  10/03/2011   Procedure: LAPAROSCOPIC VENTRAL HERNIA;  Surgeon: Adin Hector, MD;  Location: [facility];  Service: General;  Laterality: N/A;   Family History  Problem Relation Age of Onset  . Diabetes Father   . Kidney disease Father   . Depression Father   . Drug abuse Father   . Allergic rhinitis Mother   . Eczema Mother   . Urticaria Mother   . Depression Mother   .  Anxiety disorder Mother   . Bipolar disorder Mother   . Alcoholism Mother   . Drug abuse Mother   . Eating disorder Mother   . Diabetes Maternal Grandmother   . Hyperlipidemia Paternal Grandmother   . Stroke Paternal Grandmother   . Eczema Sister   . Urticaria Sister   . Colon cancer Paternal Uncle   . Other Neg Hx   . Angioedema Neg Hx   . Asthma Neg Hx    Social History   Tobacco Use  . Smoking status: Never Smoker  . Smokeless tobacco: Never Used  Substance Use Topics  . Alcohol use: No    Alcohol/week: 0.0 standard drinks  . Drug use: No   Current Outpatient Medications  Medication Sig Dispense Refill  . allopurinol (ZYLOPRIM) 100 MG tablet Take 100 mg by mouth every evening.    Marland Kitchen amLODipine (NORVASC) 10 MG tablet Take 1 tablet (10 mg total) by mouth daily. 30 tablet 0  . aspirin 325 MG tablet Take 325 mg by mouth at bedtime.     Marland Kitchen atorvastatin (LIPITOR) 10 MG tablet TAKE 1 TABLET BY MOUTH EVERY DAY (Patient not taking: Reported on 04/20/2018) 30 tablet 0  . cetirizine (ZYRTEC) 10 MG tablet Take 10 mg by mouth daily as needed for allergies.   11  . cloNIDine (CATAPRES) 0.1 MG tablet Take 0.1 mg by mouth 2 (two) times daily.  0  . colchicine 0.6 MG tablet Take 0.6 mg by mouth daily as needed (For gout flare-up.).   1  . EPINEPHrine 0.3 mg/0.3 mL IJ SOAJ injection  INJECT AS DIRECTED AS NEEDED FOR ALLERGIC REACTION (Patient taking differently: Inject 0.3 mg into the muscle as needed for anaphylaxis. ) 1 Device 2  . furosemide (LASIX) 40 MG tablet Take 40 mg by mouth daily.     Marland Kitchen gabapentin (NEURONTIN) 600 MG tablet Take 600-1,200 mg by mouth See admin instructions. Take 1 tablets every morning, 1 tablet at noon and 2 tablets every evening     . hydrochlorothiazide (HYDRODIURIL) 50 MG tablet Take 50 mg by mouth daily.     . insulin aspart (NOVOLOG) 100 UNIT/ML injection Inject 5-15 Units into the skin 3 (three) times daily with meals. Per sliding scale--pt uses Omnipod and gets a basal rate     . Insulin Glargine (BASAGLAR KWIKPEN) 100 UNIT/ML SOPN Inject 20 Units into the skin daily.     . Liraglutide (VICTOZA) 18 MG/3ML SOPN Inject 1.2 mg into the skin daily.    . mometasone (ELOCON) 0.1 % cream 1-2 times per day (Patient taking differently: Apply 1 application topically See admin instructions. 1-2 times per day) 45 g 1  . ondansetron (ZOFRAN) 4 MG tablet Take 1 tablet (4 mg total) by mouth every 8 (eight) hours as needed for up to 20 doses for nausea or vomiting. 20 tablet 0  . propranolol (INDERAL) 20 MG tablet Take 20 mg by mouth daily.    Marland Kitchen spironolactone (ALDACTONE) 50 MG tablet Take 1 tablet (50 mg total) by mouth daily. 90 tablet 3  . tiZANidine (ZANAFLEX) 2 MG tablet TAKE 1 TABLET AT BEDTIME DAILY. CAN TAKE 1/2 TABLET IN THE AM AS NEEDED (Patient taking differently: Take 1-2 mg by mouth See admin instructions. Take 2mg  at bedtime daily. Can take 1mg  in the morning as needed for muscle spasms.) 135 tablet 1  . traMADol (ULTRAM) 50 MG tablet TAKE 1 TABLET BY MOUTH 3 TIMES A DAY AS NEEDED (Patient taking differently: Take 50  mg by mouth 3 (three) times daily. ) 90 tablet 3  . Vitamin D, Ergocalciferol, (DRISDOL) 1.25 MG (50000 UT) CAPS capsule Take 1 capsule (50,000 Units total) by mouth every 7 (seven) days. 4 capsule 0   No current facility-administered  medications for this visit.    Allergies  Allergen Reactions  . Contrast Media [Iodinated Diagnostic Agents] Other (See Comments)    Difficulty breathing  . Iohexol Hives, Nausea And Vomiting and Swelling     Desc: Magnevist-gadolinium-difficulty breathing, throat swelling   . Midazolam Hcl Anaphylaxis    Difficulty breathing  . Shellfish Allergy Anaphylaxis  . Valsartan Swelling  . Metformin And Related Diarrhea and Nausea And Vomiting  . Other Itching    Patient is allergic to all nuts except peanuts.   . Avandia [Rosiglitazone Maleate] Hives and Other (See Comments)  . Geodon [Ziprasidone] Other (See Comments)    UNKNOWN  . Kiwi Extract Itching and Swelling  . Latex Itching     Review of Systems: All systems reviewed and negative except where noted in HPI.    Ct Abdomen Pelvis Wo Contrast  Result Date: 04/20/2018 CLINICAL DATA:  Abdominal pain with nausea and vomiting. Acute onset rectal bleeding EXAM: CT ABDOMEN AND PELVIS WITHOUT CONTRAST TECHNIQUE: Multidetector CT imaging of the abdomen and pelvis was performed following the standard protocol without oral or IV contrast. COMPARISON:  November 26, 2016 FINDINGS: Lower chest: Lung bases are clear. Hepatobiliary: No focal liver lesions are apparent on this noncontrast enhanced study. There is mild fatty infiltration near the fissure for the ligamentum teres. Gallbladder wall is not appreciably thickened. There is no biliary duct dilatation. Pancreas: No pancreatic mass or inflammatory focus. Spleen: No splenic lesions are evident. Adrenals/Urinary Tract: Adrenals bilaterally appear unremarkable. There is scarring in the left kidney, stable. There is no evident renal mass or hydronephrosis on either side. There is no appreciable renal or ureteral calculus on either side. Urinary bladder is midline with wall thickness within normal limits. Stomach/Bowel: There is no appreciable bowel wall or mesenteric thickening. No evident bowel  obstruction. No perirectal soft tissue stranding or fistula. No rectal wall thickening evident. No free air or portal venous air evident. Vascular/Lymphatic: There is mild calcification at the level of the aortic bifurcation and in the proximal right common iliac artery. No aneurysm evident. No adenopathy is appreciable in the abdomen or pelvis. Reproductive: The uterus is anteverted. The uterus is enlarged and lobulated in appearance consistent with leiomyomatous change. There is a calcified leiomyoma in the leftward superior aspect of the uterus measuring approximately 2 x 1.5 cm. Uterus impresses upon the superior aspect of the urinary bladder. No extrauterine pelvic mass is appreciable on this study. Other: There is postoperative change along the abdominal wall with mesh noted, a stable finding. There remains a probable small hematoma along the right anterior abdominal wall measuring 3.0 x 2.3 cm, slightly smaller than on previous study. No new abdominal wall lesions evident. There is scarring in the anterior abdominal wall which appears stable. Appendix region appears unremarkable. No evident abscess or ascites in the abdomen or pelvis. Musculoskeletal: No blastic or lytic bone lesions. No intramuscular lesions are evident. IMPRESSION: 1. No bowel obstruction. No abscess in the abdomen or pelvis. No periappendiceal region inflammation. Note that a cause for rectal bleeding is not been established with this study. 2. Scarring left kidney. No hydronephrosis. No renal or ureteral calculus on either side. 3. Postoperative change involving the anterior and pelvic wall regions. Fluid  collection along the right anterior abdominal wall is smaller compared to the previous study and likely represents partially but incompletely resolved hematoma. 4. Enlarged, leiomyomatous uterus, essentially stable in appearance compared to prior study. Electronically Signed   By: Lowella Grip III M.D.   On: 04/20/2018 17:48   Lab  Results  Component Value Date   WBC 13.1 (H) 04/20/2018   HGB 13.5 04/20/2018   HCT 42.4 04/20/2018   MCV 80.6 04/20/2018   PLT 348 04/20/2018    Lab Results  Component Value Date   CREATININE 1.11 (H) 04/20/2018   BUN 22 (H) 04/20/2018   NA 137 04/20/2018   K 3.6 04/20/2018   CL 99 04/20/2018   CO2 25 04/20/2018    Lab Results  Component Value Date   ALT 14 04/20/2018   AST 18 04/20/2018   ALKPHOS 60 04/20/2018   BILITOT 0.8 04/20/2018      Physical Exam: Weight is 233 lbs, no fevers, 5' 6.5", 132/82,   ASSESSMENT AND PLAN:  48 y/o female here for a new patient visit regarding the following:  Abdominal pain / rectal bleeding / constipation / fatty liver - acute onset pain as outlined above leading to ED visit, in the setting of new constipation and numerous episodes of reported fair volume rectal bleeding. Hgb looked okay in the ED. Her pain has improved since then but still bothering her, no further significant rectal bleeding. CT shows ? resolving hematoma but her surgery was years ago, and this has been seen on prior scans in 2018, not sure what to make of that but it appears benign. No radiographic evidence of ischemic colitis. I discussed ddx with her. Lower pain could be related to constipation, while she has some postprandial upper pain, PUD / gastritis on ddx. She has had a prior US and HIDA negative in 2019 making biliary colic less likely. Discussed ddx for her rectal bleeding and pain, could have had a fissure / hemorrhoidal bleeding, while diverticulosis or other is possible. I otherwise counseled her on fatty liver. Discussed options with her and plan as outlined below  Recommend: - colonoscopy to evaluate rectal bleeding / lower abdominal pain, with EGD to evaluate upper tract in light of her postprandial symptoms / upper pain. I have discussed risks / benefits and she wanted to proceed. In regards to timing, her Hgb is stable. I discussed risks of COVID-19  outbreak doing this now, as we are not doing any routine exams, however she is quite anxious about delaying this a few weeks and in light of her bleeding wanted to do it as soon as possible. Will call to schedule - recommend Miralax for constipation, will start twice daily dosing, titrate as needed - start omeprazole 40mg  once daily empirically to treat PUD - counseled on fatty liver, she does not drink EtOH, she is working on weight loss. LFTs normal, will need these checked once yearly   She agreed with the plan, all questions answered.   Porterdale Cellar, MD [facility]  CC: [name], DO

## 2018-04-27 ENCOUNTER — Encounter (INDEPENDENT_AMBULATORY_CARE_PROVIDER_SITE_OTHER): Payer: Self-pay | Admitting: Family Medicine

## 2018-04-27 ENCOUNTER — Telehealth: Payer: Self-pay

## 2018-04-27 NOTE — Telephone Encounter (Signed)
Called and spoke to pt. Went through instructions for colonoscopy on Thursday, 4-16.  Due to the fact that the procedure is in 2 days, unable to mail consent and acknowledgement and get back intime.  Will need to be signed the day of procedure.

## 2018-04-27 NOTE — Telephone Encounter (Signed)
Lm for pt to call back to go over instructions for colonoscopy on 4-16.  Sent Letter in MyChart yesterday, 4-13.

## 2018-04-28 ENCOUNTER — Telehealth: Payer: Self-pay | Admitting: *Deleted

## 2018-04-28 DIAGNOSIS — E118 Type 2 diabetes mellitus with unspecified complications: Secondary | ICD-10-CM | POA: Diagnosis not present

## 2018-04-28 DIAGNOSIS — Z794 Long term (current) use of insulin: Secondary | ICD-10-CM | POA: Diagnosis not present

## 2018-04-28 DIAGNOSIS — E1140 Type 2 diabetes mellitus with diabetic neuropathy, unspecified: Secondary | ICD-10-CM | POA: Diagnosis not present

## 2018-04-28 NOTE — Telephone Encounter (Signed)
Covid-19 travel screening questions  Have you traveled in the last 14 days? no If yes where?  Do you now or have you had a fever in the last 14 days? no  Do you have any respiratory symptoms of shortness of breath or cough now or in the last 14 days? no  Do you have any family members or close contacts with diagnosed or suspected Covid-19? no       

## 2018-04-29 ENCOUNTER — Ambulatory Visit (AMBULATORY_SURGERY_CENTER): Payer: Federal, State, Local not specified - PPO | Admitting: Gastroenterology

## 2018-04-29 ENCOUNTER — Encounter: Payer: Self-pay | Admitting: Gastroenterology

## 2018-04-29 ENCOUNTER — Encounter: Payer: HMO | Admitting: Gastroenterology

## 2018-04-29 ENCOUNTER — Other Ambulatory Visit: Payer: Self-pay

## 2018-04-29 ENCOUNTER — Ambulatory Visit: Payer: Self-pay

## 2018-04-29 VITALS — BP 142/77 | HR 86 | Temp 98.6°F | Resp 21 | Ht 65.0 in | Wt 237.0 lb

## 2018-04-29 DIAGNOSIS — K317 Polyp of stomach and duodenum: Secondary | ICD-10-CM | POA: Diagnosis not present

## 2018-04-29 DIAGNOSIS — K2980 Duodenitis without bleeding: Secondary | ICD-10-CM | POA: Diagnosis not present

## 2018-04-29 DIAGNOSIS — K625 Hemorrhage of anus and rectum: Secondary | ICD-10-CM

## 2018-04-29 DIAGNOSIS — K2950 Unspecified chronic gastritis without bleeding: Secondary | ICD-10-CM | POA: Diagnosis not present

## 2018-04-29 DIAGNOSIS — K639 Disease of intestine, unspecified: Secondary | ICD-10-CM

## 2018-04-29 DIAGNOSIS — K633 Ulcer of intestine: Secondary | ICD-10-CM | POA: Diagnosis not present

## 2018-04-29 DIAGNOSIS — K2970 Gastritis, unspecified, without bleeding: Secondary | ICD-10-CM | POA: Diagnosis not present

## 2018-04-29 DIAGNOSIS — R109 Unspecified abdominal pain: Secondary | ICD-10-CM

## 2018-04-29 MED ORDER — SODIUM CHLORIDE 0.9 % IV SOLN: 500.0000 mL | Freq: Once | INTRAVENOUS | Status: DC

## 2018-04-29 NOTE — Progress Notes (Signed)
To PACU, VSS. Report to [email or web address] 

## 2018-04-29 NOTE — Op Note (Signed)
[facility] Patient Name: Tina [name] Procedure Date: 04/29/2018 10:32 AM MRN: [id number] Endoscopist: Remo Lipps P. Havery Moros , MD Age: 48 Referring MD:  Date of Birth: 10/04/1970 Gender: Female Account #: [id number] Procedure:                Upper GI endoscopy Indications:              Upper abdominal pain, somewhat improved with                            omeprazole 40mg  once daily Medicines:                Monitored Anesthesia Care Procedure:                Pre-Anesthesia Assessment:                           - Prior to the procedure, a History and Physical                            was performed, and patient medications and                            allergies were reviewed. The patient's tolerance of                            previous anesthesia was also reviewed. The risks                            and benefits of the procedure and the sedation                            options and risks were discussed with the patient.                            All questions were answered, and informed consent                            was obtained. Prior Anticoagulants: The patient has                            taken no previous anticoagulant or antiplatelet                            agents. ASA Grade Assessment: III - A patient with                            severe systemic disease. After reviewing the risks                            and benefits, the patient was deemed in                            satisfactory condition to undergo the procedure.  After obtaining informed consent, the endoscope was                            passed under direct vision. Throughout the                            procedure, the patient's blood pressure, pulse, and                            oxygen saturations were monitored continuously. The                            Model [id number]-[id number] scope was introduced                            through the mouth,  and advanced to the second part                            of duodenum. The upper GI endoscopy was                            accomplished without difficulty. The patient                            tolerated the procedure well. Scope In: Scope Out: Findings:                 Esophagogastric landmarks were identified: the                            Z-line was found at 36 cm, the gastroesophageal                            junction was found at 36 cm and the upper extent of                            the gastric folds was found at 36 cm from the                            incisors.                           The exam of the esophagus was otherwise normal.                           Patchy mild inflammation characterized by erosions                            and erythema was found in the gastric antrum.                            Biopsies were taken with a cold forceps for  Helicobacter pylori testing.                           The exam of the stomach was otherwise normal.                           A single 3 to 4 mm sessile polyp was found in the                            second portion of the duodenum. The polyp was                            biopsies and thought to be removed with a cold                            biopsy forceps.                           The exam of the duodenum was otherwise normal. Complications:            No immediate complications. Estimated blood loss:                            Minimal. Estimated Blood Loss:     Estimated blood loss was minimal. Impression:               - Esophagogastric landmarks identified.                           - Normal esophagus.                           - Mild Gastritis. Biopsied.                           - A single duodenal polyp. Resected and retrieved.                           - Otherwise normal exam Recommendation:           - Patient has a contact number available for                             emergencies. The signs and symptoms of potential                            delayed complications were discussed with the                            patient. Return to normal activities tomorrow.                            Written discharge instructions were provided to the                            patient.                           -  Resume previous diet.                           - Continue present medications including trial of                            omeprazole if that has helped                           - Await pathology results. Remo Lipps P. Armbruster, MD 04/29/2018 11:13:09 AM This report has been signed electronically.

## 2018-04-29 NOTE — Progress Notes (Signed)
Called to room to assist during endoscopic procedure.  Patient ID and intended procedure confirmed with present staff. Received instructions for my participation in the procedure from the performing physician.  

## 2018-04-29 NOTE — Patient Instructions (Signed)
Await pathology results.  Restart  Omeprazole (Prilosec) if that has helped in the past.  Use Miralax for constipation and avoid NSAIDs.    Repeat colonoscopy in the upcoming months.   YOU HAD AN ENDOSCOPIC PROCEDURE TODAY AT [facility]:   Refer to the procedure report that was given to you for any specific questions about what was found during the examination.  If the procedure report does not answer your questions, please call your gastroenterologist to clarify.  If you requested that your care partner not be given the details of your procedure findings, then the procedure report has been included in a sealed envelope for you to review at your convenience later.  YOU SHOULD EXPECT: Some feelings of bloating in the abdomen. Passage of more gas than usual.  Walking can help get rid of the air that was put into your GI tract during the procedure and reduce the bloating. If you had a lower endoscopy (such as a colonoscopy or flexible sigmoidoscopy) you may notice spotting of blood in your stool or on the toilet paper. If you underwent a bowel prep for your procedure, you may not have a normal bowel movement for a few days.  Please Note:  You might notice some irritation and congestion in your nose or some drainage.  This is from the oxygen used during your procedure.  There is no need for concern and it should clear up in a day or so.  SYMPTOMS TO REPORT IMMEDIATELY:   Following lower endoscopy (colonoscopy or flexible sigmoidoscopy):  Excessive amounts of blood in the stool  Significant tenderness or worsening of abdominal pains  Swelling of the abdomen that is new, acute  Fever of 100F or higher   Following upper endoscopy (EGD)  Vomiting of blood or coffee ground material  New chest pain or pain under the shoulder blades  Painful or persistently difficult swallowing  New shortness of breath  Fever of 100F or higher  Black, tarry-looking stools  For urgent or  emergent issues, a gastroenterologist can be reached at any hour by calling [phone].   DIET:  We do recommend a small meal at first, but then you may proceed to your regular diet.  Drink plenty of fluids but you should avoid alcoholic beverages for 24 hours.  ACTIVITY:  You should plan to take it easy for the rest of today and you should NOT DRIVE or use heavy machinery until tomorrow (because of the sedation medicines used during the test).    FOLLOW UP: Our staff will call the number listed on your records the next business day following your procedure to check on you and address any questions or concerns that you may have regarding the information given to you following your procedure. If we do not reach you, we will leave a message.  However, if you are feeling well and you are not experiencing any problems, there is no need to return our call.  We will assume that you have returned to your regular daily activities without incident.  If any biopsies were taken you will be contacted by phone or by letter within the next 1-3 weeks.  Please call us at [phone] if you have not heard about the biopsies in 3 weeks.    SIGNATURES/CONFIDENTIALITY: You and/or your care partner have signed paperwork which will be entered into your electronic medical record.  These signatures attest to the fact that that the information above on your After Visit Summary  has been reviewed and is understood.  Full responsibility of the confidentiality of this discharge information lies with you and/or your care-partner. 

## 2018-04-29 NOTE — Op Note (Signed)
[place] Patient Name: Tina [name] Procedure Date: 04/29/2018 10:32 AM MRN: [id number] Endoscopist: Tina [name] , Tina [name] Age: 48 Referring Tina [name]:  Date of Birth: 1970-11-23 Gender: Female Account #: [id number] Procedure:                Colonoscopy Indications:              Abdominal pain, Hematochezia, patient with history                            of CVA on aspirin 325mg , symptoms improving.                            Initial CT Scan did not show any clear abnormality.                            Patient did not have diarrhea, infectious seems                            less likely. Medicines:                Monitored Anesthesia Care Procedure:                Pre-Anesthesia Assessment:                           - Prior to the procedure, a History and Physical                            was performed, and patient medications and                            allergies were reviewed. The patient's tolerance of                            previous anesthesia was also reviewed. The risks                            and benefits of the procedure and the sedation                            options and risks were discussed with the patient.                            All questions were answered, and informed consent                            was obtained. Prior Anticoagulants: The patient has                            taken no previous anticoagulant or antiplatelet                            agents. ASA Grade Assessment: III - A patient with  severe systemic disease. After reviewing the risks                            and benefits, the patient was deemed in                            satisfactory condition to undergo the procedure.                           After obtaining informed consent, the colonoscope                            was passed under direct vision. Throughout the                            procedure, the patient's blood pressure,  pulse, and                            oxygen saturations were monitored continuously. The                            Colonoscope was introduced through the anus and                            advanced to the the cecum, identified by the                            ileocecal valve. The colonoscopy was technically                            difficult and complex due to inadequate bowel prep.                            The patient tolerated the procedure well. The                            quality of the bowel preparation was inadequate.                            The ileocecal valve and the rectum were                            photographed. Scope In: 10:50:32 AM Scope Out: 11:05:30 AM Scope Withdrawal Time: 0 hours 9 minutes 44 seconds  Total Procedure Duration: 0 hours 14 minutes 58 seconds  Findings:                 The perianal and digital rectal examinations were                            normal.                           A large amount of liquid stool was found in the  entire colon, making visualization difficult.                            Lavage of the area was performed using copious                            amounts of sterile water, resulting in incomplete                            clearance with fair visualization. The preparation                            was inadequate for screening purposes                           An area of erythematous, inflamed and ulcerated                            mucosa was found in the descending colon, at the                            splenic flexure, in the mid transverse colon and in                            the distal transverse colon, mostly in a linear                            fashion. Gross appearance most concerning for                            ischemic colitis. Biopsies were taken with a cold                            forceps for histology.                           The exam was otherwise without  abnormality. Cecal                            cap could not cleared due to stool. Several areas                            not well visualized due to stool burden, no obvious                            mass lesions. Complications:            No immediate complications. Estimated blood loss:                            Minimal. Estimated Blood Loss:     Estimated blood loss was minimal. Impression:               - Preparation of the colon was inadequate for  screening purposes as above, several areas not well                            visualized                           - Ulcerated mucosa as outlined above - gross                            appearance most concerning for ischemic colitis of                            the distal transverse colon and proximal left                            colon. Biopsied. Infectious possible but seems less                            likely.                           - Of what what was visualized of the remainder                            examined portion of the colon, the examination was                            otherwise normal. Recommendation:           - Patient has a contact number available for                            emergencies. The signs and symptoms of potential                            delayed complications were discussed with the                            patient. Return to normal activities tomorrow.                            Written discharge instructions were provided to the                            patient.                           - Resume previous diet.                           - Continue present medications including aspirin                           - Avoid constipation, use Miralax PRN                           -  may want to hold diuretics (HCTZ / Lasix) as                            diuretics can predispose to colinic ischemia                           - avoid NSAIDs                            - Await pathology results. Patient has had a prior                            hypercoag workup negative in 2011 during time of                            CVA. If biopsies show ischemic colitis may consider                            another evaluation by Hematology. CT scan shows no                            vascular compromise                           - Repeat colonoscopy at some point in time in the                            upcoming months because the bowel preparation was                            suboptimal. Tina Lipps P. Armbruster, Tina [name] 04/29/2018 11:30:53 AM This report has been signed electronically.

## 2018-04-30 ENCOUNTER — Telehealth: Payer: Self-pay | Admitting: *Deleted

## 2018-04-30 NOTE — Telephone Encounter (Signed)
  Follow up Call-  Call back number 04/29/2018  Post procedure Call Back phone  # [phone] hm  Permission to leave phone message Yes  Some recent data might be hidden    Spoke with Tina [name] Patient questions:  Do you have a fever, pain , or abdominal swelling? no Pain Score  0  Have you tolerated food without any problems? yes  Have you been able to return to your normal activities? yes  Do you have any questions about your discharge instructions: Diet   no Medications  no Follow up visit  no  Do you have questions or concerns about your Care? no  Actions: * If pain score is 4 or above:

## 2018-05-03 ENCOUNTER — Other Ambulatory Visit: Payer: Self-pay

## 2018-05-03 NOTE — Patient Outreach (Signed)
  [facility]) Care Management Chronic Special Needs Program  05/03/2018  Name: MAHARI VANKIRK DOB: 1970/09/25  MRN: [id number]  Ms. Konstance Fite is enrolled in a chronic special needs plan for Diabetes. Client called with no answer No answer and HIPAA compliant message left. Plan for 2nd outreach call in 3-4 days Chronic care management coordinator will attempt outreach in 3-4 days.   Peter Garter RN, Jackquline Denmark, CDE Chronic Care Management Coordinator [facility] Care Management [phone]

## 2018-05-04 ENCOUNTER — Other Ambulatory Visit: Payer: Self-pay | Admitting: Gastroenterology

## 2018-05-04 DIAGNOSIS — J309 Allergic rhinitis, unspecified: Secondary | ICD-10-CM | POA: Diagnosis not present

## 2018-05-04 DIAGNOSIS — Z8719 Personal history of other diseases of the digestive system: Secondary | ICD-10-CM | POA: Diagnosis not present

## 2018-05-04 DIAGNOSIS — R109 Unspecified abdominal pain: Secondary | ICD-10-CM

## 2018-05-04 DIAGNOSIS — I6930 Unspecified sequelae of cerebral infarction: Secondary | ICD-10-CM | POA: Diagnosis not present

## 2018-05-06 ENCOUNTER — Other Ambulatory Visit (INDEPENDENT_AMBULATORY_CARE_PROVIDER_SITE_OTHER): Payer: Self-pay | Admitting: Family Medicine

## 2018-05-06 DIAGNOSIS — E7849 Other hyperlipidemia: Secondary | ICD-10-CM

## 2018-05-07 ENCOUNTER — Other Ambulatory Visit: Payer: Self-pay

## 2018-05-07 NOTE — Patient Outreach (Addendum)
[facility]) Care Management Chronic Special Needs Program  05/07/2018  Name: Tina [name] DOB: 1970/07/12  MRN: [id number]  Tina [name] is enrolled in a chronic special needs plan for Diabetes. Reviewed and updated care plan.  Subjective: Client states she had rectal bleeding and abd pain that she has had a work up for.  States she has been eating less and trying to follow her plan from weight management.  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  States she would like someone to talk to about her stress.  Denies any falls in the last 3 months. States she is getting her medications from her husband's insurance now and she can afford her medications.  Goals Addressed            This Visit's Progress   . COMPLETED:  Acknowledge receipt of Advanced Directive package       Has forms    .  Diabetes Patient stated goal to lose 10 lbs in the next 3 months (pt-stated)   On track    Continue to follow low carbohydrate diet as advised from the [facility]     . Advanced Care Planning complete by the next 6 months   On track    Plan to take  Advanced Directives to be notarized     . Client understands the importance of follow-up with providers by attending scheduled visits   On track   . Client will not report change from baseline and no repeated symptoms of stroke with in the next 3 months    On track   . COMPLETED: Client will report abillity to obtain Medications within next 3 months       Obtaining medications without difficulty    . Client will report improved coping with diabetes and weight by  the next 6 months       [employer] worker will contact you to discuss stress     . Client will report no fall or injuries in the next 3 months.   On track    Reports no falls Get up slowly    . Client will use Assistive Devices as needed and  verbalize understanding of device use   On track   . Client will verbalize knowledge of self management of Hypertension as evidences by BP reading of 140/90 or less; or as defined by provider   On track   . HEMOGLOBIN A1C < 7.0       Diabetes self management actions:  Glucose monitoring per provider recommendations  Eat Healthy  Check feet daily  Visit provider every 3-6 months as directed  Hbg A1C level every 3-6 months.  Eye Exam yearly    . Maintain timely refills of diabetic medication as prescribed within the year .   On track   . Obtain annual  Lipid Profile, LDL-C   On track   . Obtain Annual Eye (retinal)  Exam    On track   . Obtain Annual Foot Exam   On track   . Obtain annual screen for micro albuminuria (urine) , nephropathy (kidney problems)   On track   . Obtain Hemoglobin A1C at least 2 times per year   On track   . Visit Primary Care Provider or Endocrinologist at least 2 times per year    On track    Client is  not meeting diabetes self management goal of hemoglobin A1C of <7% with last reading of 8.5%.  Client has completed her Advanced Directives but has not gotten notarized yet.  Client agreeable to referral to social worker for assistance with stress  Discussed COVID19 cause, symptoms, precautions (social distancing, stay at home order, hand washing), confirmed client knows how to contact provider. Plan:  Send successful outreach letter with a copy of their individualized care plan and Send individual care plan to provider  Chronic care management coordinator will outreach in:  3 Months  Will refer to:  Social Work   Peter Garter RN, [employer], [place] Programmer, multimedia Care Management [phone]

## 2018-05-10 ENCOUNTER — Other Ambulatory Visit: Payer: Self-pay

## 2018-05-10 ENCOUNTER — Ambulatory Visit (INDEPENDENT_AMBULATORY_CARE_PROVIDER_SITE_OTHER): Payer: HMO | Admitting: Family Medicine

## 2018-05-10 ENCOUNTER — Encounter (INDEPENDENT_AMBULATORY_CARE_PROVIDER_SITE_OTHER): Payer: Self-pay | Admitting: Family Medicine

## 2018-05-10 DIAGNOSIS — E1140 Type 2 diabetes mellitus with diabetic neuropathy, unspecified: Secondary | ICD-10-CM | POA: Diagnosis not present

## 2018-05-10 DIAGNOSIS — Z6839 Body mass index (BMI) 39.0-39.9, adult: Secondary | ICD-10-CM | POA: Diagnosis not present

## 2018-05-10 DIAGNOSIS — E559 Vitamin D deficiency, unspecified: Secondary | ICD-10-CM

## 2018-05-10 DIAGNOSIS — Z794 Long term (current) use of insulin: Secondary | ICD-10-CM

## 2018-05-10 MED ORDER — VITAMIN D (ERGOCALCIFEROL) 1.25 MG (50000 UNIT) PO CAPS: 50000.0000 [IU] | ORAL_CAPSULE | ORAL | 0 refills | Status: DC

## 2018-05-11 ENCOUNTER — Encounter (INDEPENDENT_AMBULATORY_CARE_PROVIDER_SITE_OTHER): Payer: Self-pay | Admitting: Family Medicine

## 2018-05-11 DIAGNOSIS — E118 Type 2 diabetes mellitus with unspecified complications: Secondary | ICD-10-CM | POA: Diagnosis not present

## 2018-05-11 DIAGNOSIS — E1140 Type 2 diabetes mellitus with diabetic neuropathy, unspecified: Secondary | ICD-10-CM | POA: Diagnosis not present

## 2018-05-11 DIAGNOSIS — I639 Cerebral infarction, unspecified: Secondary | ICD-10-CM | POA: Diagnosis not present

## 2018-05-11 DIAGNOSIS — E7800 Pure hypercholesterolemia, unspecified: Secondary | ICD-10-CM | POA: Diagnosis not present

## 2018-05-11 DIAGNOSIS — E559 Vitamin D deficiency, unspecified: Secondary | ICD-10-CM | POA: Diagnosis not present

## 2018-05-11 DIAGNOSIS — I10 Essential (primary) hypertension: Secondary | ICD-10-CM | POA: Diagnosis not present

## 2018-05-11 DIAGNOSIS — G629 Polyneuropathy, unspecified: Secondary | ICD-10-CM | POA: Diagnosis not present

## 2018-05-11 DIAGNOSIS — Z6838 Body mass index (BMI) 38.0-38.9, adult: Secondary | ICD-10-CM | POA: Diagnosis not present

## 2018-05-11 NOTE — Progress Notes (Signed)
Office: [phone]  /  Fax: [phone] TeleHealth Visit:  Tina [name] has verbally consented to this TeleHealth visit today. The patient is located at home, the provider is located at the [facility] office. The participants in this visit include the listed provider and patient. The visit was conducted today via telephone call (unable to Webex).  HPI:   Chief Complaint: OBESITY Tina [name] is here to discuss her progress with her obesity treatment plan. She is on the Category 3 plan and is following her eating plan approximately 75% of the time. She states she is exercising 0 minutes 0 times per week. Tina [name] states she has just now gotten back on track with her plan. She had been skipping many meals and states she has occasional sweet tea and root beer floats. We were unable to weigh the patient today for this TeleHealth visit. She states her weight was 231 lbs today. She has lost 5 lbs since starting treatment with Tina [name].  Diabetes Mellitus with Neuropathy with Insulin Tyreisha has a diagnosis of diabetes mellitus which is not well controlled. Karene states fasting blood sugars are in the range of 90's to 120. She reports her highest 2-hour postprandial was 170 after a meal. Last A1c was 8.5 on 02/15/2018. Khiley has an endocrinologist appointment tomorrow with Dr. Garnet Koyanagi. She has been working on intensive lifestyle modifications including diet, exercise, and weight loss to help control her blood glucose levels.  Vitamin D deficiency Ani has a diagnosis of Vitamin D deficiency, which is not at goal. Her last Vitamin D level was reported to be 26.5 on 02/15/2018. She is currently taking prescription Vit D and denies nausea, vomiting or muscle weakness.  ASSESSMENT AND PLAN:  Type 2 diabetes mellitus with diabetic neuropathy, with long-term current use of insulin (HCC)  Vitamin D deficiency - Plan: Vitamin D, Ergocalciferol, (DRISDOL) 1.25 MG (50000 UT) CAPS capsule   Class 2 severe obesity with serious comorbidity and body mass index (BMI) of 39.0 to 39.9 in adult, unspecified obesity type (HCC)  PLAN:  Diabetes Mellitus with Neuropathy with Insulin Tina [name] has been given extensive diabetes education by myself today including ideal fasting and post-prandial blood glucose readings, individual ideal HgA1c goals  and hypoglycemia prevention. We discussed the importance of good blood sugar control to decrease the likelihood of diabetic complications such as nephropathy, neuropathy, limb loss, blindness, coronary artery disease, and death. We discussed the importance of intensive lifestyle modification including diet (reducing simple carbs), exercise and weight loss as the first line treatment for diabetes. Tina [name] agrees to continue her diabetes medications and will follow-up at the agreed upon time.  Vitamin D Deficiency Tina [name] was informed that low Vitamin D levels contributes to fatigue and are associated with obesity, breast, and colon cancer. She agrees to continue to take prescription Vit D @ 50,000 IU every week #4 with 0 refills and will follow-up for routine testing of Vitamin D, at least 2-3 times per year. She was informed of the risk of over-replacement of Vitamin D and agrees to not increase her dose unless she discusses this with Tina [name] first. Tina [name] agrees to follow-up with our clinic in 2-3 weeks. She was advised to have her endocrinologist do a Vitamin D level at her appointment tomorrow.  Obesity [name] is currently in the action stage of change. As such, her goal is to continue with weight loss efforts. She has agreed to follow the Category 3 plan. [name] has been instructed to do yoga with her  sons 2-3 days a week and walk every other day for 30 minutes for weight loss and overall health benefits. We discussed the following Behavioral Modification Strategies today: increasing lean protein intake, decreasing simple carbohydrates, decrease liquid  calories, no skipping meals, better snacking choices, and planning for success.  Tina [name] has agreed to follow up with our clinic in 2-3 weeks. She was informed of the importance of frequent follow up visits to maximize her success with intensive lifestyle modifications for her multiple health conditions.  ALLERGIES: Allergies  Allergen Reactions  . Contrast Media [Iodinated Diagnostic Agents] Other (See Comments)    Difficulty breathing  . Iohexol Hives, Nausea And Vomiting and Swelling     Desc: Magnevist-gadolinium-difficulty breathing, throat swelling   . Midazolam Hcl Anaphylaxis    Difficulty breathing  . Shellfish Allergy Anaphylaxis  . Valsartan Swelling  . Metformin And Related Diarrhea and Nausea And Vomiting  . Other Itching    Patient is allergic to all nuts except peanuts.   . Avandia [Rosiglitazone Maleate] Hives and Other (See Comments)  . Geodon [Ziprasidone] Other (See Comments)    UNKNOWN  . Kiwi Extract Itching and Swelling  . Latex Itching    MEDICATIONS: Current Outpatient Medications on File Prior to Visit  Medication Sig Dispense Refill  . allopurinol (ZYLOPRIM) 100 MG tablet Take 100 mg by mouth every evening.    Marland Kitchen amLODipine (NORVASC) 10 MG tablet Take 1 tablet (10 mg total) by mouth daily. 30 tablet 0  . aspirin 325 MG tablet Take 325 mg by mouth at bedtime.     Marland Kitchen atorvastatin (LIPITOR) 10 MG tablet TAKE 1 TABLET BY MOUTH EVERY DAY (Patient not taking: Reported on 04/20/2018) 30 tablet 0  . cetirizine (ZYRTEC) 10 MG tablet Take 10 mg by mouth daily as needed for allergies.   11  . cloNIDine (CATAPRES) 0.1 MG tablet Take 0.1 mg by mouth 2 (two) times daily.  0  . colchicine 0.6 MG tablet Take 0.6 mg by mouth daily as needed (For gout flare-up.).   1  . EPINEPHrine 0.3 mg/0.3 mL IJ SOAJ injection INJECT AS DIRECTED AS NEEDED FOR ALLERGIC REACTION (Patient not taking: Reported on 04/29/2018) 1 Device 2  . fluticasone (FLONASE) 50 MCG/ACT nasal spray     .  furosemide (LASIX) 40 MG tablet Take 40 mg by mouth daily.     Marland Kitchen gabapentin (NEURONTIN) 600 MG tablet Take 600-1,200 mg by mouth See admin instructions. Take 1 tablets every morning, 1 tablet at noon and 2 tablets every evening     . hydrochlorothiazide (HYDRODIURIL) 50 MG tablet Take 50 mg by mouth daily.     . insulin aspart (NOVOLOG) 100 UNIT/ML injection Inject 5-15 Units into the skin 3 (three) times daily with meals. Per sliding scale--pt uses Omnipod and gets a basal rate     . Insulin Glargine (BASAGLAR KWIKPEN) 100 UNIT/ML SOPN Inject 20 Units into the skin daily.     . Liraglutide (VICTOZA) 18 MG/3ML SOPN Inject 1.2 mg into the skin daily.    . mometasone (ELOCON) 0.1 % cream 1-2 times per day (Patient not taking: Reported on 04/29/2018) 45 g 1  . omeprazole (PRILOSEC) 40 MG capsule TAKE 1 CAPSULE BY MOUTH EVERY DAY 90 capsule 1  . ondansetron (ZOFRAN) 4 MG tablet Take 1 tablet (4 mg total) by mouth every 8 (eight) hours as needed for up to 20 doses for nausea or vomiting. (Patient not taking: Reported on 04/29/2018) 20 tablet 0  .  propranolol (INDERAL) 20 MG tablet Take 20 mg by mouth daily.    Marland Kitchen spironolactone (ALDACTONE) 50 MG tablet Take 1 tablet (50 mg total) by mouth daily. 90 tablet 3  . tiZANidine (ZANAFLEX) 2 MG tablet TAKE 1 TABLET AT BEDTIME DAILY. CAN TAKE 1/2 TABLET IN THE AM AS NEEDED (Patient taking differently: Take 1-2 mg by mouth See admin instructions. Take 2mg  at bedtime daily. Can take 1mg  in the morning as needed for muscle spasms.) 135 tablet 1  . traMADol (ULTRAM) 50 MG tablet TAKE 1 TABLET BY MOUTH 3 TIMES A DAY AS NEEDED (Patient taking differently: Take 50 mg by mouth 3 (three) times daily. ) 90 tablet 3   No current facility-administered medications on file prior to visit.     PAST MEDICAL HISTORY: Past Medical History:  Diagnosis Date  . Allergy   . Anemia    DURING MENSES--HAS HEAVY BLEEDING WITH PERODS  . Anxiety   . Back pain, chronic    "ongoing"  .  Blood transfusion    IN 2012  AFTER C -SECTION  . Cerebral thrombosis with cerebral infarction [facility]) JUNE 2011   RIGHT SIDED WEAKNESS ( ARM AND LEG ) AND SPASMS-remains with slight weakness and vertigo.  . Constipation   . Depression   . Diabetes mellitus   . Diabetic neuropathy ([place])    BOTH FEET --COMES AND GOES  . Edema, lower extremity   . Endometriosis   . Fatty liver   . GERD (gastroesophageal reflux disease)    with pregnancy  . H/O eye surgery   . Headache(784.0)    MIGRAINES--NOT REALLY HEADACHE-MORE LIKE PRESSURE SENSATION IN HEAD-FEELS DIZZIY AND  FAINT AS THE PRESSURE RESOLVES  . Hernia, incisional, RLQ, s/p lap repair Sep 2013 09/02/2011  . History of vertigo 03/22/2018  . Hx of migraines 10/19/2011  . Hypertension   . Leg pain, right    "like bad Charley horse"  . Multiple food allergies   . Panic disorder without agoraphobia   . Rash    HANDS, ARMS --STATES HX OF RASH EVER SINCE CHILDBIRTH/PREGNANCY.  STATES THE RASH OFTEN OCCURS WHEN SHE IS REALLY STRESSED."goes and comes-presently left ring finger"  . Restless leg syndrome    DIAGNOSED BY SLEEP STUDY - PT TOLD SHE DID NOT HAVE SLEEP APNEA  . Right rotator cuff tear    PAIN IN RIGHT SHOULDER  . SBO (small bowel obstruction) ([place]) 06/03/2012  . Shortness of breath   . Spastic hemiplegia affecting dominant side ([place])   . Stomach problems   . Stroke ([place])   . Ventral hernia    RIGHT LOWER QUADRANT-CAUSING SOME PAIN  . Weakness of right side of body     PAST SURGICAL HISTORY: Past Surgical History:  Procedure Laterality Date  . APPLICATION OF WOUND VAC N/A 05/25/2015   Procedure: APPLICATION OF WOUND VAC;  Surgeon: Michael Boston, MD;  Location: [facility];  Service: General;  Laterality: N/A;  . CESAREAN SECTION  2012  . COLONOSCOPY    . DIAGNOSTIC LAPAROSCOPY    . ESOPHAGOGASTRODUODENOSCOPY N/A 06/03/2012   Procedure: ESOPHAGOGASTRODUODENOSCOPY (EGD);  Surgeon: [name] Craver, MD;  Location: [facility] ENDOSCOPY;  Service:  Endoscopy;  Laterality: N/A;  . EXCISION MASS ABDOMINAL N/A 05/25/2015   Procedure: ABDOMINAL WALL EXPLORATION EXCISION OF SEROMA REMOVAL OF REDUNDANT SKIN ;  Surgeon: Michael Boston, MD;  Location: [facility];  Service: General;  Laterality: N/A;  . EYE SURGERY     Eye laser for vessel hemorrhaging  .  fybroid removal    . HERNIA REPAIR  10/03/11   ventral hernia repair  . INSERTION OF MESH N/A 02/04/2013   Procedure: INSERTION OF MESH;  Surgeon: Adin Hector, MD;  Location: [facility];  Service: General;  Laterality: N/A;  . UMBILICAL HERNIA REPAIR N/A 02/04/2013   Procedure: LAPAROSCOPIC ventral wall hernia repair LAPAROSCOPIC LYSIS OF ADHESIONS laparoscopic exploration of abdomen ;  Surgeon: Adin Hector, MD;  Location: [facility];  Service: General;  Laterality: N/A;  . UPPER GASTROINTESTINAL ENDOSCOPY    . URETER REVISION     Bilateral "twisted"  . UTERINE FIBROID SURGERY     2 SURGERIES FOR FIBROIDS  . VENTRAL HERNIA REPAIR  10/03/2011   Procedure: LAPAROSCOPIC VENTRAL HERNIA;  Surgeon: Adin Hector, MD;  Location: [facility];  Service: General;  Laterality: N/A;    SOCIAL HISTORY: Social History   Tobacco Use  . Smoking status: Never Smoker  . Smokeless tobacco: Never Used  Substance Use Topics  . Alcohol use: No    Alcohol/week: 0.0 standard drinks  . Drug use: No    FAMILY HISTORY: Family History  Problem Relation Age of Onset  . Diabetes Father   . Kidney disease Father   . Depression Father   . Drug abuse Father   . Allergic rhinitis Mother   . Eczema Mother   . Urticaria Mother   . Depression Mother   . Anxiety disorder Mother   . Bipolar disorder Mother   . Alcoholism Mother   . Drug abuse Mother   . Eating disorder Mother   . Diabetes Maternal Grandmother   . Hyperlipidemia Paternal Grandmother   . Stroke Paternal Grandmother   . Eczema Sister   . Urticaria Sister   . Colon cancer Paternal Uncle   . Other Neg Hx   . Angioedema Neg Hx   . Asthma Neg Hx   .  Colon polyps Neg Hx   . Esophageal cancer Neg Hx   . Rectal cancer Neg Hx   . Stomach cancer Neg Hx    ROS: Review of Systems  Gastrointestinal: Negative for nausea and vomiting.  Musculoskeletal:       Negative for muscle weakness.   PHYSICAL EXAM: Pt in no acute distress  RECENT LABS AND TESTS: BMET    Component Value Date/Time   NA 137 04/20/2018 1537   NA 141 11/03/2017 1310   K 3.6 04/20/2018 1537   CL 99 04/20/2018 1537   CO2 25 04/20/2018 1537   GLUCOSE 240 (H) 04/20/2018 1537   BUN 22 (H) 04/20/2018 1537   BUN 11 11/03/2017 1310   CREATININE 1.11 (H) 04/20/2018 1537   CALCIUM 10.1 04/20/2018 1537   GFRNONAA 59 (L) 04/20/2018 1537   GFRAA >60 04/20/2018 1537   Lab Results  Component Value Date   HGBA1C 8.5 (H) 02/15/2018   HGBA1C 8.4 (H) 11/03/2017   HGBA1C 7.5 (H) 05/18/2015   HGBA1C 8.7 (H) 02/05/2013   HGBA1C 6.9 (H) 06/03/2012   No results found for: INSULIN CBC    Component Value Date/Time   WBC 13.1 (H) 04/20/2018 1537   RBC 5.26 (H) 04/20/2018 1537   HGB 13.5 04/20/2018 1537   HCT 42.4 04/20/2018 1537   PLT 348 04/20/2018 1537   MCV 80.6 04/20/2018 1537   MCH 25.7 (L) 04/20/2018 1537   MCHC 31.8 04/20/2018 1537   RDW 14.6 04/20/2018 1537   LYMPHSABS 1.3 05/11/2017 1323   MONOABS 0.6 05/11/2017 1323   EOSABS  0.2 05/11/2017 1323   BASOSABS 0.0 05/11/2017 1323   Iron/TIBC/Ferritin/ %Sat    Component Value Date/Time   IRON 114 11/01/2015 1031   TIBC 372 11/01/2015 1031   FERRITIN 35 11/01/2015 1031   IRONPCTSAT 31 11/01/2015 1031   Lipid Panel     Component Value Date/Time   CHOL 169 02/15/2018 1034   TRIG 168 (H) 02/15/2018 1034   HDL 41 02/15/2018 1034   CHOLHDL 6.0 10/17/2011 0315   VLDL 59 (H) 10/17/2011 0315   LDLCALC 94 02/15/2018 1034   Hepatic Function Panel     Component Value Date/Time   PROT 8.0 04/20/2018 1537   PROT 6.8 11/03/2017 1310   ALBUMIN 3.7 04/20/2018 1537   ALBUMIN 3.8 11/03/2017 1310   AST 18  04/20/2018 1537   ALT 14 04/20/2018 1537   ALKPHOS 60 04/20/2018 1537   BILITOT 0.8 04/20/2018 1537   BILITOT 0.3 11/03/2017 1310   BILIDIR <0.1 06/03/2012 0220   IBILI NOT CALCULATED 06/03/2012 0220      Component Value Date/Time   TSH 0.947 11/03/2017 1310   TSH 2.020 09/08/2017 1129   TSH 1.045 06/03/2012 0520   Results for VONCEIL, UPSHUR (MRN [id number]) as of 05/11/2018 08:12  Ref. Range 02/15/2018 10:34  Vitamin D, 25-Hydroxy Latest Ref Range: 30.0 - 100.0 ng/mL 26.5 (L)   I, Michaelene Song, am acting as Location manager for [employer], FNP-C.  I have reviewed the above documentation for accuracy and completeness, and I agree with the above.  -  , FNP-C.

## 2018-05-13 ENCOUNTER — Other Ambulatory Visit: Payer: Self-pay | Admitting: *Deleted

## 2018-05-13 ENCOUNTER — Encounter: Payer: Self-pay | Admitting: *Deleted

## 2018-05-13 NOTE — Patient Outreach (Signed)
[place] [facility] [facility]) Care Management  05/13/2018  Lashundra KAMILAH CORREIA 1970/06/26 [id number]    CSW was able to make initial contact with patient today to perform phone assessment, as well as assess and assist with social work needs and services.  CSW introduced self, explained role and types of services provided through [facility] ([place] [facility]).  CSW further explained to patient that CSW works with Peter Garter, [place] Management Coordinator, also with [facility].  CSW then explained the reason for the call, indicating that Mrs. Sandlin thought that patient would benefit from social work services and resources to assist with stress reduction.  CSW obtained two HIPAA compliant identifiers from patient, which included patient's name and date of birth.  Patient reported that she is doing much better, since having talked with Mrs. Sandlin, now that she has been able to establish a routine with her family.  Patient went on to say that "life was very stressful" for her, directly after the "stay at home orders were initiated" and for several weeks thereafter.  Patient indicated that she currently lives with her husband, 6 year old nephew, 8 year old twin boys and an 73-year-old son.  Patient stated that their daily routines are very structured and that her nephew is able to help with the boys because her husband works from home full-time and is a Charity fundraiser.  Patient admitted to feeling "tired all the time", prior to Covid-19, but that she is now pushing herself to be more active and to exercise at least once per day.  Patient indicated that this "time at home" has allowed her to complete several projects that she had been putting off for quite some time, such as repainting her bathroom and redesigning one of the boys bedrooms to make it into a classroom. Patient went on to say that she forces the boys to get up  around 7AM to start their day.  At first, patient reported that she was spending all her time in the kitchen cooking meals for everyone, but that she has since enlisted the help of her husband and nephew to share the burden.  Patient now enjoys doing yoga and taking long walks with her boys, when they need to take a break from their schoolwork.  Patient also enjoys spending time alone in the evenings, after everyone else has gone to bed for the night.   Patient admits to having very supportive friends and family members, that check in with her on a routine basis.  CSW spoke with patient about proper use of deep breathing exercises and relaxation techniques, encouraging patient to use both forms of stress relief when she becomes anxious and/or overwhelmed.  Patient reported that she also likes to take long drives alone, just to clear her mind and "get re-centered".  Patient denies feeling homicidal or suicidal, indicating that her stress level never gets so bad that she feels inclined to harm herself or anyone else.  Patient did not wish for CSW to send her information about "Stress and Anxiety", nor was patient interested in receiving EMMI information, pertaining specifically to "Signs and Symptoms of Depression"    Patient did not think that counseling and supportive services would be helpful for her, after CSW agreed to provide these services telephonically, at least until Covid-19 restrictions have been lifted, or assist with the referral process.  However, patient admitted to "stress eating" and requested that [facility] email her information.  CSW obtained patient's email  address ([email or web address]), agreeing to email her all of the following information today: Psychology Today - Stress and Eating [name] [employer] [place] for Disease Control and Prevention - [place] and Coping During Covid-19 Healthline - Emotional Eating:  What You  Should Know American Psychological Association - Stress and Eating CSW then agreed to follow-up with patient in one week, on Thursday, May 20, 2018, around [place] to ensure that she received the information, as well as answer any questions that she may have at that time.  Nat Christen, BSW, MSW, LCSW  Licensed Education officer, [facility]  Mailing [place] [address] [place], [place] [address] Physical Address-[address], [place], [place] [address] Toll Free Main # [phone] Fax # [phone] Cell # [phone]  Office # [phone] [email or web address]

## 2018-05-18 ENCOUNTER — Other Ambulatory Visit: Payer: Self-pay

## 2018-05-18 ENCOUNTER — Ambulatory Visit (INDEPENDENT_AMBULATORY_CARE_PROVIDER_SITE_OTHER): Payer: HMO | Admitting: Psychology

## 2018-05-18 ENCOUNTER — Encounter (INDEPENDENT_AMBULATORY_CARE_PROVIDER_SITE_OTHER): Payer: Self-pay | Admitting: Family Medicine

## 2018-05-18 DIAGNOSIS — F3289 Other specified depressive episodes: Secondary | ICD-10-CM | POA: Diagnosis not present

## 2018-05-18 NOTE — Progress Notes (Signed)
Tina: [phone]  /  Fax: [phone]    Date: May 18, 2018   Appointment Start Time: 10:01am Duration: 65 minutes Provider: Glennie Isle, Psy.D. Type of Session: Intake for Individual Therapy  Location of Patient: Home Location of Provider: Provider's Home Type of Contact: Telepsychological Visit via [employer] WebEx  Informed Consent: Prior to proceeding with today's appointment, two pieces of identifying information were obtained from Tina [name]. In addition, Tina [name]'s physical location at the time of this appointment was obtained. Tina [name] reported she was at home and provided the address. In the event of technical difficulties, Tina [name] shared a phone number she could be reached at. Tina [name] and this provider participated in today's telepsychological service. Also, Tina [name] denied anyone else being present in the room or on the WebEx appointment.   The provider's role was explained to Tina [name]. The provider reviewed and discussed issues of confidentiality, privacy, and limits therein. In addition to verbal informed consent, written informed consent for psychological services was obtained from Tina [name] prior to the initial intake interview. Written consent included information concerning the practice, financial arrangements, and confidentiality and patients' rights. Since the clinic is not a 24/7 crisis center, mental health emergency resources were shared, and the provider explained MyChart, e-mail, voicemail, and/or other messaging systems should be utilized only for non-emergency reasons. Tina [name] verbally acknowledged understanding of the aforementioned, and agreed to use mental health emergency resources discussed if needed. Moreover, Tina [name] agreed information may be shared with other [facility] providers as needed for coordination of care. Written consent will be obtained.   Prior to initiating telepsychological services, Tina [name] was provided with an informed  consent document via e-mail, which included the development of a safety plan (i.e., an emergency contact and emergency resources) in the event of an emergency/crisis. Tina [name] returned the completed consent form prior to today's appointment via a MyChart message as this provider's clinic is closed. This provider verbally reviewed the consent form during today's appointment prior to proceeding with the appointment. Tina [name] verbally acknowledged understanding that she is ultimately responsible for understanding her insurance benefits as it relates to reimbursement of telepsychological services. This provider also reviewed confidentiality, as it relates to telepsychological services, as well as the rationale for telepsychological services. More specifically, this provider's clinic is closed for in-person visits due to COVID-19. Therapeutic services will resume to in-person appointments once the clinic re-opens. Tina [name] expressed understanding regarding the rationale for telepsychological services. In addition, this provider explained the telepsychological services informed consent document would be considered an addendum to the initial consent document. Tina [name] verbally consented to proceed. Regarding MyChart messages, Tina [name] verbally acknowledged understanding that any messages sent would be a part of her electronic medical record; therefore, visible to all providers.   Chief Complaint/HPI: Tina [name] was referred by Tina [name], Tina [name] due to depression with emotional eating behaviors. Per the note for the visit with Tina [name], Tina [name] on 04/08/2018, "Tina [name] has a new diagnosis of depression with emotional eating behaviors. She states she is struggling with increased stress eating due to COVID-19 and using food for comfort to the extent that it is negatively impacting her health. She often snacks when she is not hungry. Tina [name] sometimes feels she is out of control and then feels guilty that she made poor food  choices. She has been working on behavior modification techniques to help reduce her emotional eating and has been somewhat successful. She shows no sign of suicidal or homicidal ideations. Tina [name] expresses the desire to meet with our  bariatric psychologist, Dr. Mallie [name]."  During today's appointment, Tina [name] reported she spoke with a co-worker that reportedly also meets with this provider. Thus, she requested to meet with this provider during her appointment with Tina [name], Tina [name]. She shared her father passed away before her 10th birthday and her favorite uncle passed away shortly after. Tina [name] believes the onset of the emotional eating was around that time. She shared she craves sweets, such as Tina [name]. Her last episode of emotional eating was the day before yesterday due to being stressed with housework resulting in her consuming fruit covered with chocolate. Additionally, Tina [name] reported she last engaged in binge eating a couple months ago. She noted the onset of binge eating was in high school. She could not recall what precipitated the onset of binge eating. Tina [name] described a typical binge episode as consisting of "eating too much" of whatever she is eating. For example, "A big ole bowl of grits and eggs and toast." She described the frequency of binge eating as once or twice a month. Moreover, Tina [name] shared a history of purging starting in high school. She shared the last time she purged was last week as she did not like the feeling of being full. Tina [name] reported the frequency of purging as once or twice a week. Thus, brief psychoeducation regarding the consequences of purging was provided. She denied engagement in other compensatory strategies. However, she recalled as a child her mother would restrict her calorie intake, have run in the morning, and encouraged laxative use. Tina [name] has never been diagnosed with an eating disorder. She added, "Never really talk about it." Furthermore, Tina [name]  was asked to complete a questionnaire assessing various behaviors related to emotional eating. Tina [name] endorsed the following: experience food cravings on a regular basis, eat certain foods when you are anxious, stressed, depressed, or your feelings are hurt, use food to help you cope with emotional situations, find food is comforting to you, overeat when you are angry or upset, overeat when you are worried about something, not worry about what you eat when you are in a good mood and overeat when you are alone, but eat much less when you are with other people.  During Tina [name]'s initial appointment at [facility] with Tina [name] on 11/03/2017,  Nyeshia reported experiencing the following: snacking frequently in the evenings, frequently drinking liquids with calories, frequently eating larger portions than normal , binge eating behaviors and struggling with emotional eating. She also reported "significant food cravings for bread and pasta" and "skips breakfast frequently."   Mental Status Examination:  Appearance: neat Behavior: cooperative Mood: euthymic Affect: mood congruent Speech: normal in rate, volume, and tone Eye Contact: appropriate Psychomotor Activity: appropriate Thought Process: linear, logical, and goal directed  Content/Perceptual Disturbances: denies suicidal and homicidal ideation, plan, and intent and no hallucinations, delusions, bizarre thinking or behavior reported or observed Orientation: time, person, place and purpose of appointment Cognition/Sensorium: memory, attention, language, and fund of knowledge intact  Insight: good Judgment: good  Family & Psychosocial History: Tina [name] reported she has been married for 24 years, and they have three sons (ages 33-twins and 22). Her nephew (age 1) also resides with them. 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 She indicated her highest level of education is a bachelor's in psychology  degree. Moreover, she stated her social support system consists of her mother, step-father, husband, and two sisters. 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  Medical History:  Past Medical History:  Diagnosis Date   Allergy    Anemia    DURING MENSES--HAS HEAVY BLEEDING WITH PERODS   Anxiety    Back pain, chronic    "ongoing"   Blood transfusion    IN 2012  AFTER C -SECTION   Cerebral thrombosis with cerebral infarction [facility]) JUNE 2011   RIGHT SIDED WEAKNESS ( ARM AND LEG ) AND SPASMS-remains with slight weakness and vertigo.   Constipation    Depression    Diabetes mellitus    Diabetic neuropathy (HCC)    BOTH FEET --COMES AND GOES   Edema, lower extremity    Endometriosis    Fatty liver    GERD (gastroesophageal reflux disease)    with pregnancy   H/O eye surgery    Headache(784.0)    MIGRAINES--NOT REALLY HEADACHE-MORE LIKE PRESSURE SENSATION IN HEAD-FEELS DIZZIY AND  FAINT AS THE PRESSURE RESOLVES   Hernia, incisional, RLQ, s/p lap repair Sep 2013 09/02/2011   History of vertigo 03/22/2018   Hx of migraines 10/19/2011   Hypertension    Leg pain, right    "like bad Charley horse"   Multiple food allergies    Panic disorder without agoraphobia    Rash    HANDS, ARMS --STATES HX OF RASH EVER SINCE CHILDBIRTH/PREGNANCY.  STATES THE RASH OFTEN OCCURS WHEN SHE IS REALLY STRESSED."goes and comes-presently left ring finger"   Restless leg syndrome    DIAGNOSED BY SLEEP STUDY - PT TOLD SHE DID NOT HAVE SLEEP APNEA   Right rotator cuff tear    PAIN IN RIGHT SHOULDER   SBO (small bowel obstruction) ([place]) 06/03/2012   Shortness of breath    Spastic hemiplegia affecting dominant side (HCC)    Stomach problems    Stroke (HCC)    Ventral hernia    RIGHT LOWER QUADRANT-CAUSING SOME PAIN   Weakness of right side of body    Past Surgical History:  Procedure Laterality Date   APPLICATION OF WOUND VAC N/A  05/25/2015   Procedure: APPLICATION OF WOUND VAC;  Surgeon: Michael Boston, MD;  Location: [facility];  Service: General;  Laterality: N/A;   CESAREAN SECTION  2012   COLONOSCOPY     DIAGNOSTIC LAPAROSCOPY     ESOPHAGOGASTRODUODENOSCOPY N/A 06/03/2012   Procedure: ESOPHAGOGASTRODUODENOSCOPY (EGD);  Surgeon: [name] Craver, MD;  Location: [name];  Service: Endoscopy;  Laterality: N/A;   EXCISION MASS ABDOMINAL N/A 05/25/2015   Procedure: ABDOMINAL WALL EXPLORATION EXCISION OF SEROMA REMOVAL OF REDUNDANT SKIN ;  Surgeon: Michael Boston, MD;  Location: [facility];  Service: General;  Laterality: N/A;   EYE SURGERY     Eye laser for vessel hemorrhaging   fybroid removal     HERNIA REPAIR  10/03/11   ventral hernia repair   INSERTION OF MESH N/A 02/04/2013   Procedure: INSERTION OF MESH;  Surgeon: Adin Hector, MD;  Location: [facility];  Service: General;  Laterality: N/A;   UMBILICAL HERNIA REPAIR N/A 02/04/2013   Procedure: LAPAROSCOPIC ventral wall hernia repair LAPAROSCOPIC LYSIS OF ADHESIONS laparoscopic exploration of abdomen ;  Surgeon: Adin Hector, MD;  Location: [facility];  Service: General;  Laterality: N/A;   UPPER GASTROINTESTINAL ENDOSCOPY     URETER REVISION     Bilateral "twisted"   UTERINE FIBROID SURGERY     2 SURGERIES FOR FIBROIDS   VENTRAL HERNIA REPAIR  10/03/2011   Procedure: LAPAROSCOPIC VENTRAL HERNIA;  Surgeon: Adin Hector, MD;  Location: [facility];  Service: General;  Laterality: N/A;   Current Outpatient Medications on File Prior to Visit  Medication Sig Dispense Refill   allopurinol (ZYLOPRIM) 100 MG tablet Take 100 mg by mouth every evening.     amLODipine (NORVASC) 10 MG tablet Take 1 tablet (10 mg total) by mouth daily. 30 tablet 0   aspirin 325 MG tablet Take 325 mg by mouth at bedtime.      atorvastatin (LIPITOR) 10 MG tablet TAKE 1 TABLET BY MOUTH EVERY DAY (Patient not taking: Reported on 04/20/2018) 30 tablet 0   cetirizine (ZYRTEC) 10 MG tablet Take  10 mg by mouth daily as needed for allergies.   11   cloNIDine (CATAPRES) 0.1 MG tablet Take 0.1 mg by mouth 2 (two) times daily.  0   colchicine 0.6 MG tablet Take 0.6 mg by mouth daily as needed (For gout flare-up.).   1   EPINEPHrine 0.3 mg/0.3 mL IJ SOAJ injection INJECT AS DIRECTED AS NEEDED FOR ALLERGIC REACTION (Patient not taking: Reported on 04/29/2018) 1 Device 2   fluticasone (FLONASE) 50 MCG/ACT nasal spray      furosemide (LASIX) 40 MG tablet Take 40 mg by mouth daily.      gabapentin (NEURONTIN) 600 MG tablet Take 600-1,200 mg by mouth See admin instructions. Take 1 tablets every morning, 1 tablet at noon and 2 tablets every evening      hydrochlorothiazide (HYDRODIURIL) 50 MG tablet Take 50 mg by mouth daily.      insulin aspart (NOVOLOG) 100 UNIT/ML injection Inject 5-15 Units into the skin 3 (three) times daily with meals. Per sliding scale--pt uses Omnipod and gets a basal rate      Insulin Glargine (BASAGLAR KWIKPEN) 100 UNIT/ML SOPN Inject 20 Units into the skin daily.      Liraglutide (VICTOZA) 18 MG/3ML SOPN Inject 1.2 mg into the skin daily.     mometasone (ELOCON) 0.1 % cream 1-2 times per day (Patient not taking: Reported on 04/29/2018) 45 g 1   omeprazole (PRILOSEC) 40 MG capsule TAKE 1 CAPSULE BY MOUTH EVERY DAY 90 capsule 1   ondansetron (ZOFRAN) 4 MG tablet Take 1 tablet (4 mg total) by mouth every 8 (eight) hours as needed for up to 20 doses for nausea or vomiting. (Patient not taking: Reported on 04/29/2018) 20 tablet 0   propranolol (INDERAL) 20 MG tablet Take 20 mg by mouth daily.     spironolactone (ALDACTONE) 50 MG tablet Take 1 tablet (50 mg total) by mouth daily. 90 tablet 3   tiZANidine (ZANAFLEX) 2 MG tablet TAKE 1 TABLET AT BEDTIME DAILY. CAN TAKE 1/2 TABLET IN THE AM AS NEEDED (Patient taking differently: Take 1-2 mg by mouth See admin instructions. Take 2mg  at bedtime daily. Can take 1mg  in the morning as needed for muscle spasms.) 135 tablet 1     traMADol (ULTRAM) 50 MG tablet TAKE 1 TABLET BY MOUTH 3 TIMES A DAY AS NEEDED (Patient taking differently: Take 50 mg by mouth 3 (three) times daily. ) 90 tablet 3   Vitamin D, Ergocalciferol, (DRISDOL) 1.25 MG (50000 UT) CAPS capsule Take 1 capsule (50,000 Units total) by mouth every 7 (seven) days. 4 capsule 0   No current facility-administered medications on file prior to visit.   Zyanne denied a history of head injuries and loss of consciousness; however, she report loss of consciousness when she had a stroke in 2011.   Mental Health History: Emmry first received therapeutic services in childhood in the form of  individual therapy. She noted her mother took her, but she was unsure what the therapy was for. She added, "I know I misbehaved a lot." She later saw a therapist 8 years ago for individual therapy for stress, anxiety, and depression. Rukiya stated it was for a duration of approximately a year. She re-initiated individual therapy approximately one year ago for stress and panic attacks with a psychiatrist. Lujuana continues to meet with Dr. [name] [name] at [facility]. They meet every two weeks. Her last appointment with Dr. [name] [name] was one month ago, and she plans to make another appointment. She indicated they do not discuss her eating habits. Kealie agreed to inform Dr. [name] [name] that she initiated services with this provider, and was agreeable to completing authorization for coordination of care if deemed necessary. Currently, Dr. [name] [name] prescribes Citalopram and Propranolol, which she described as "somewhat" helpful. It was recommended she schedule an appointment and discuss the medications with her psychiatrist; Karlen agreed. Moreover, she denied a history of hospitalizations for psychiatric concerns. Regarding family history of mental health related concerns, Beckham shared her mother was diagnosed with "multiple personalities, depression, and bipolar." Furthermore,  Tima denied a trauma history, including psychological, physical  and sexual abuse, as well as neglect.   Shaeley described her typical mood as "this to 100 real quick." She reported she will "snap" and "start screaming at everybody." She shared her children do no listen until she screams, and she will "calm" herself back down by excusing herself. She denied the outbursts ever being physical in nature. She clarified it is when her children do not listen about completing chores and denied it being abusive in nature. Jezlyn further reported experiencing anhedonia and social withdrawal since she suffered a stroke, and explained she currently has physical limitations. Nevertheless, she discussed staying in contact with childhood friends. Pallie also reported memory concerns starting after her stroke resulting in her utilizing compensatory strategies (e.g., calendar); it was recommended she inform her PCP. She also discussed sleep issues in that she has difficulty falling and staying asleep resulting in 3-4 hours of sleep a night contributing to fatigue. She was receptive to discussing the sleep issues with her psychiatrist. Danijah noted the sleep issues started after her stroke and she explained she had the stroke while she was sleeping. The aforementioned has contributed to her "always thinking about dying." She clarified the thoughts about death are not due to suicidal ideation rather fear of dying. Zynia further endorsed experiencing the following: depressed mood; fluctuations in appetite; decreased self-esteem due to weight; and attention and concentration issues when focusing on multiple projects at once. She also experiences hopelessness when she is unable to "control stuff." She added, "I'm not giving up," but she described feeling stuck at times. Currently, she also has panic attacks twice a month, which is reportedly a reduction.   Latissa denied experiencing the following: obsessions and  compulsions, hallucinations and delusions, paranoia, mania and substance use. She also denied current suicidal ideation, plan, and intent; history of and current homicidal ideation, plan, and intent; and history of and current engagement in self-harm.  Regarding suicidal ideation, Teria reported suicidal ideation starting in childhood. While she could not recall details, she noted at the age of 48 she "took some pills." She stated she took laxatives by mistake resulting in diarrhea throughout the night. She did not inform anyone and did not receive any medical attention. Sadia denied any other suicide attempts. Fredricka further shared experiencing suicidal ideation on and off throughout  her life in the form of driving off a cliff or a wall or "what if I just disappear;" however, she denied experiencing intent. She last experienced suicidal ideation approximately one year ago, and noted, "I haven't had any recently." Notably, Georgetta endorsed item 9 (i.e., "Do you feel that your weight problem is so hopeless that sometimes life doesn't seem worth living?") on the modified PHQ-9 during her initial appointment with Tina [name] on 11/03/2017. She shared she endorsed it due to hopelessness about weight and not due to suicidal ideation. Based on Shiesha history, a verbal safety plan was developed. The following protective factors were identified for Tina [name]: family. She added, "I don't want to leave my kids and have them go to a funeral and bury their mom." If she were to become overwhelmed in the future, which is a sign that a crisis may occur, she identified the following coping skills she could engage in: working of house projects, listening to music, playing with the dog, talking with friends, and praying. It was recommended she write the aforementioned down and develop a coping card for future reference. She was agreeable, and noted she would place the aforementioned on a board she uses for reminders.  Additionally, it was recommended she engage in one activity a day between now and the next appointment with this provider to increase coping; she agreed and noted, "Sounds like a good plan." Blaise further reported, "I'm working on a mommy room that's just for me." She also expressed willingness to reach out to family for help, reach out to her psychiatrist, and utilize emergency resources provided if needed. Specifically, Deliyah shared she would probably call her sister or best friend first, as they are both in the counseling field. While Keia reported having access to firearms, she explained her husband has a lock on the firearms along with an additional gun lock. While she is aware of where the key is for the firearms, she is unaware of where the ammunition is. She can "get it," but "can't load it or fire it." To further make her environment safe, this provider discussed removing extra medications in the house. She shared, "I just have my regular medications in my house" and she denied having extra medications in the house.   The following strengths were observed by this provider: ability to express thoughts and feelings during the therapeutic session, ability to establish and benefit from a therapeutic relationship, ability to learn and practice coping skills, willingness to work toward established goal(s) with the clinic and ability to engage in reciprocal conversation.  Legal History: Kimberle denied a history of legal involvement.   Structured Assessment Results: The Patient Health Questionnaire-9 (PHQ-9) is a self-report measure that assesses symptoms and severity of depression over the course of the last two weeks. Sophira obtained a score of 19 suggesting moderately severe depression. Kaislee finds the endorsed symptoms to be somewhat difficult. Depression screen PHQ 2/9 05/18/2018  Decreased Interest 3  Down, Depressed, Hopeless 2  PHQ - 2 Score 5  Altered sleeping 3  Tired, decreased energy  3  Change in appetite 3  Feeling bad or failure about yourself  3  Trouble concentrating 2  Moving slowly or fidgety/restless 0  Suicidal thoughts 0  PHQ-9 Score 19  Difficult doing work/chores -  Some recent data might be hidden   The Generalized Anxiety Disorder-7 (GAD-7) is a brief self-report measure that assesses symptoms of anxiety over the course of the last two weeks. Adela obtained a  score of 15 suggesting severe anxiety. GAD 7 : Generalized Anxiety Score 05/18/2018  Nervous, Anxious, on Edge 2  Control/stop worrying 3  Worry too much - different things 3  Trouble relaxing 2  Restless 0  Easily annoyed or irritable 2  Afraid - awful might happen 3  Total GAD 7 Score 15  Anxiety Difficulty Somewhat difficult   Interventions: A chart review was conducted prior to the clinical intake interview. The PHQ-9, and GAD-7 were administered and a clinical intake interview was completed. In addition, Dalaina was asked to complete a Mood and Food questionnaire to assess various behaviors related to emotional eating. Throughout session, empathic reflections and validation was provided. Continuing treatment with this provider was discussed and a treatment goal was established. A risk assessment was completed, and a verbal safety plan was developed. Moreover, brief psychoeducation regarding the consequences of purging was provided.  Provisional DSM-5 Diagnosis: 311 (F32.8) Other Specified Depressive Disorder, Emotional Eating Behaviors and Anxious Distress  Plan: Darchelle appears able and willing to participate as evidenced by collaboration on a treatment goal, engagement in reciprocal conversation, and asking questions as needed for clarification. Based on Shekinah's current presenting concerns, this provider recommended an appointment be scheduled in one week; Kayliana agreed. Thus, the next appointment will be scheduled in one week, which will be via [employer]. The option for a referral for  longer-term therapeutic service was discussed and recommended, but Jania expressed desire to continue with her psychiatrist at this time. Thus, it was recommended she contact her psychiatrist to schedule a follow-up appointment due to ongoing stressors; Elodie agreed. The following treatment goal was established: decrease emotional eating. Once this provider's Tina resumes in-person appointments, Zarrah will be notified. The treatment modality will be individual therapeutic services, including an eclectic therapeutic approach utilizing techniques from Cognitive Behavioral Therapy, Patient Centered Therapy, Dialectical Behavior Therapy, Acceptance and Commitment Therapy, Interpersonal Therapy, and Cognitive Restructuring. Therapeutic approach will include various interventions as appropriate, such as validation, support, mindfulness, thought defusion, reframing, psychoeducation, values assessment, and role playing. This provider will regularly review the treatment plan and medical chart to keep informed of status changes. Rayyan expressed understanding and agreement with the initial treatment plan of care.

## 2018-05-19 DIAGNOSIS — K559 Vascular disorder of intestine, unspecified: Secondary | ICD-10-CM | POA: Diagnosis not present

## 2018-05-19 DIAGNOSIS — K922 Gastrointestinal hemorrhage, unspecified: Secondary | ICD-10-CM | POA: Diagnosis not present

## 2018-05-19 DIAGNOSIS — Z7189 Other specified counseling: Secondary | ICD-10-CM | POA: Diagnosis not present

## 2018-05-19 DIAGNOSIS — I6930 Unspecified sequelae of cerebral infarction: Secondary | ICD-10-CM | POA: Diagnosis not present

## 2018-05-19 NOTE — Progress Notes (Unsigned)
Office: [phone]  /  Fax: [phone]    Date: May 24, 2018   Appointment Start Time: 10:10am Duration:*** Provider: Glennie Isle, Psy.D. Type of Session: Individual Therapy  Location of Patient: Home Location of Provider: [facility] Type of Contact: Telepsychological Visit via [employer] WebEx   Session Content: Tina [name] is a 48 y.o. female presenting via [place] for a follow-up appointment to address the previously established treatment goal of decreasing emotional eating. Today's appointment was a telepsychological visit, as this provider's clinic is closed for in-person visits due to COVID-19. Therapeutic services will resume to in-person appointments once the clinic re-opens. Tina [name] expressed understanding regarding the rationale for telepsychological services, and provided verbal consent for today's appointment. Prior to proceeding with today's appointment, Tina [name]'s physical location at the time of this appointment was obtained. Tina [name] reported she was at *** and provided the address. In the event of technical difficulties, Tina [name] shared a phone number she could be reached at. Tina [name] and this provider participated in today's telepsychological service. Also, Tina [name] denied anyone else being present in the room or on the WebEx appointment ***.  This provider conducted a brief check-in and verbally administered the PHQ-9 and GAD-7. ***  She discussed feeling "down and depressed" due to being in the house and not being able to work. Tina [name] described a fluctuation in appetite resulting in her skipping meals. She denied any episodes of purging since the last appointment with this provider.   Tina [name] noted she has not called her psychiatrist, but plans to call today.   Tina [name] was receptive to today's session as evidenced by openness to sharing, responsiveness to feedback, and ***.  Mental Status Examination:  Appearance: neat Behavior: cooperative Mood:  euthymic Affect: mood congruent Speech: normal in rate, volume, and tone Eye Contact: appropriate Psychomotor Activity: appropriate Thought Process: linear, logical, and goal directed  Content/Perceptual Disturbances: denies suicidal and homicidal ideation, plan, and intent and no hallucinations, delusions, bizarre thinking or behavior reported or observed Orientation: time, person, place and purpose of appointment Cognition/Sensorium: memory, attention, language, and fund of knowledge intact  Insight: good Judgment: good  Structured Assessment Results: The Patient Health Questionnaire-9 (PHQ-9) is a self-report measure that assesses symptoms and severity of depression over the course of the last week. Tina [name] obtained a score of *** suggesting {GBPHQ9SEVERITY:21752}. Tina [name] finds the endorsed symptoms to be not difficult at all. Decreased interest 0  Down, depressed, hopeless 1  Altered sleeping 3  Tired, decreased energy 1  Change in appetite 3  Feeling bad or failure about yourself 1  Trouble concentrating 1  Moving slowly or fidgety/restless 0  Suicidal thoughts 0  PHQ-9 Score ***    The Generalized Anxiety Disorder-7 (GAD-7) is a brief self-report measure that assesses symptoms of anxiety over the course of the last week. Tina [name] obtained a score of *** suggesting {gbgad7severity:21753}. Tina [name] finds the endorsed symptoms to be not difficult at all. Nervous, anxious, on edge 1  Control/stop worrying 3  Worrying too much- different things 3  Trouble relaxing 2  Restless 1  Easily annoyed or irritable 1  Afraid-awful might happen 2  GAD-7 Score ***   Interventions:  {Interventions:22172}  DSM-5 Diagnosis: 311 (F32.8) Other Specified Depressive Disorder, Emotional Eating Behaviors and Anxious Distress  Treatment Goal & Progress: During the initial appointment with this provider, the following treatment goal was established: {gbtxgoals:21759}. Tina [name] has demonstrated  progress in her goal as evidenced by ***  Plan: Tina [name] continues to appear able and willing  to participate as evidenced by engagement in reciprocal conversation, and asking questions for clarification as appropriate. The next appointment will be scheduled in {gbweeks:21758}, which will be via [employer]. Once this provider's office resumes in-person appointments, Tina [name] will be notified. The next session will focus on reviewing learned skills, and working towards the established treatment goal.***

## 2018-05-20 ENCOUNTER — Encounter: Payer: Self-pay | Admitting: *Deleted

## 2018-05-20 ENCOUNTER — Other Ambulatory Visit: Payer: Self-pay | Admitting: *Deleted

## 2018-05-20 NOTE — Patient Outreach (Signed)
[facility]) Care Management  05/20/2018  Tina [name] 1970/02/10 [id number]   CSW was able to make contact with patient today to ensure that she received the list of resources that [place] emailed to her, as well as assess for any additional social work needs and services.  Patient admitted to receiving all of the following information: Psychology Today - Stress and Eating [name] National Library of Medicine National Institutes of Health - Stress and Eating Behaviors Centers for Disease Control and Prevention - Mental Health and Coping During Covid-19 Healthline - Emotional Eating:  What You Should Know American Psychological Association - Stress and Eating Patient reported that she has already had an opportunity to review most of the information, finding it very helpful, as well as informative.  Patient denied having any questions for CSW regarding the information received.  CSW inquired as to how CSW could be of further assistance to patient at this time.  Patient denied being able to identify any additional social work needs at present, but agreed to contact CSW directly if social work needs arise in the near future.  Patient reported that her nephew and husband continue to help out around the house, with cooking, completing chores, helping her three sons with their homework, housekeeping, grocery shopping, etc., which has allowed her an opportunity to "take a step back".  Patient admits to having more time to herself, which is good for her mental health.    CSW will perform a case closure on patient, as all goals of treatment have been met from social work standpoint and no additional social work needs have been identified at this time.  CSW will notify Peter Garter, Chronic Special Needs Plan Chronic Care Management Coordinator, also with [facility], of CSW's plans to close patient's case.  CSW will fax an update to patient's Primary Care  Physician, Dr. [name] to ensure that they are aware of CSW's involvement with patient's plan of care.    Nat Christen, BSW, MSW, LCSW  Licensed Education officer, [facility]  [place] [address] [place], [place] [address] Physical Address-[address], [place], [place] [address] Toll Free Main # [phone] Fax # [phone] Cell # [phone]  Office # [phone] [email or web address]

## 2018-05-24 ENCOUNTER — Ambulatory Visit (INDEPENDENT_AMBULATORY_CARE_PROVIDER_SITE_OTHER): Payer: Federal, State, Local not specified - PPO | Admitting: Psychology

## 2018-05-24 ENCOUNTER — Encounter (INDEPENDENT_AMBULATORY_CARE_PROVIDER_SITE_OTHER): Payer: Self-pay

## 2018-05-24 ENCOUNTER — Other Ambulatory Visit: Payer: Self-pay

## 2018-05-24 ENCOUNTER — Telehealth: Payer: Self-pay

## 2018-05-24 ENCOUNTER — Ambulatory Visit (INDEPENDENT_AMBULATORY_CARE_PROVIDER_SITE_OTHER): Payer: HMO | Admitting: Psychology

## 2018-05-24 DIAGNOSIS — F3289 Other specified depressive episodes: Secondary | ICD-10-CM

## 2018-05-24 NOTE — Telephone Encounter (Signed)
VM full patient need to be r/s due to AK not being in office.

## 2018-05-24 NOTE — Progress Notes (Addendum)
Office: [phone]  /  Fax: [phone]    Date: May 24, 2018   Appointment Start Time:10:10am  Duration: 23 minutes Provider: Glennie Isle, Psy.D. Type of Session: Individual Therapy  Location of Patient: Home Location of Provider: [facility] Type of Contact: Telepsychological Visit via [employer] WebEx   Session Content: Tina [name] is a 48 y.o. female presenting via [place] for a follow-up appointment to address the previously established treatment goal of decreasing emotional eating. Today's appointment was a telepsychological visit, as this provider's clinic is closed for in-person visits due to COVID-19. Therapeutic services will resume to in-person appointments once the clinic re-opens. Tina [name] expressed understanding regarding the rationale for telepsychological services, and provided verbal consent for today's appointment. Prior to proceeding with today's appointment, Tina [name]'s physical location at the time of this appointment was obtained. Tina [name] reported she was at home and provided the address. In the event of technical difficulties, Tina [name] shared a phone number she could be reached at. Tina [name] and this provider participated in today's telepsychological service. Also, Tina [name] denied anyone else being present in the room or on the WebEx appointment.  Of note, this provider called Tina [name] at 10:02am as she did not present for her WebEx appointment. She was unsure of how to join; therefore, directions were provided and an e-mail was sent to her e-mail address. She explained she had all e-mails sent previously to her husband, but shared desire for future WebEx appointment e-mails be sent to her personal e-mail. Due to scheduling errors reportedly from another clinic, this provider's front desk staff had to re-enter Conny's appointment resulting in her responses on the PHQ-9 and GAD-7 being deleted. Thus, this provider called Tina [name] at 12:14pm and the measures were  re-administered. The duration of that call was approximately 6 minutes.   This provider conducted a brief check-in and verbally administered the PHQ-9 and GAD-7. Tina [name] shared she has not contacted her psychiatrist to schedule an appointment, but noted a plan to call today. She was receptive to signing an authorization for coordination care should it be needed. Since the last appointment with this provider, Tina [name] discussed experiencing decreased mood and fluctuations in appetite, but noted an increase in motivation yesterday resulting in her completing various tasks. A brief risk assessment was completed. She denied experiencing suicidal and homicidal ideation, plan, and intent since the last appointment with this provider. She indicated she still has the information discussed as part of her safety plan. Session then focused on problem solving to assist with food intake. She was agreeable to setting reminders on her phone and was also agreeable to engaging in meal prep. Moreover, psychoeducation regarding emotional versus physical hunger was provided. Tina [name] was given a handout via MyChart to utilize between now and the next appointment to increase awareness of hunger patterns and subsequent eating. Prior to sending the [place] message, this provider explained the message would be visible to all providers, as it would be part of the electronic medical record. Tina [name] verbally acknowledged understanding, and verbally consented to this provider sending the MyChart message. She was also agreeable to authorizations for coordination of care being sent via [place]. Session concluded with a discussion of Tina [name]'s "mommy room." She noted, "Haven't touched it" since the last appointment; however, was agreeable to spending 10 minutes a day in it to increase self-care. Tina [name] was receptive to today's session as evidenced by openness to sharing, responsiveness to feedback, and willingness to implement discussed  strategies. Notably, she was observed writing and she  noted she was "making notes."   Mental Status Examination:  Appearance: neat Behavior: cooperative Mood: euthymic Affect: mood congruent Speech: normal in rate, volume, and tone Eye Contact: appropriate Psychomotor Activity: appropriate Thought Process: linear, logical, and goal directed  Content/Perceptual Disturbances: denies suicidal and homicidal ideation, plan, and intent and no hallucinations, delusions, bizarre thinking or behavior reported or observed Orientation: time, person, place and purpose of appointment Cognition/Sensorium: memory, attention, language, and fund of knowledge intact  Insight: fair Judgment: fair  Structured Assessment Results: The Patient Health Questionnaire-9 (PHQ-9) is a self-report measure that assesses symptoms and severity of depression over the course of the last week. Tina [name] obtained a score of 10 suggesting moderate depression. Tina [name] finds the endorsed symptoms to be not difficult at all. Decreased interest 0  Down, depressed, hopeless 1  Altered sleeping 3  Tired, decreased energy 1  Change in appetite 3  Feeling bad or failure about yourself 1  Trouble concentrating 1  Moving slowly or fidgety/restless 0  Suicidal thoughts 0  PHQ-9 Score 10    The Generalized Anxiety Disorder-7 (GAD-7) is a brief self-report measure that assesses symptoms of anxiety over the course of the last week. [place] obtained a score of 5 suggesting mild anxiety. Tina [name] finds the endorsed symptoms to be not difficult at all. Nervous, anxious, on edge 1  Control/stop worrying 1  Worrying too much- different things 1  Trouble relaxing 0  Restless 1  Easily annoyed or irritable 0  Afraid-awful might happen 1  GAD-7 Score 5   Interventions:  Administered PHQ-9 and GAD-7 for symptom monitoring Provided empathic reflections and validation Conducted a risk assessment Engaged patient in problem  solving Psychoeducation provided regarding physical versus emotional hunger Focused on rapport building Employed supportive psychotherapy interventions to facilitate reduced distress, and to improve coping skills with identified stressors Conducted a brief chart review  DSM-5 Diagnosis: 311 (F32.8) Other Specified Depressive Disorder, Emotional Eating Behaviors and Anxious Distress  Treatment Goal & Progress: During the initial appointment with this provider, the following treatment goal was established: decrease emotional eating. Progress is limited, as Tina [name] has just begun treatment with this provider; however, she is receptive to the interaction and interventions and rapport is being established.   Plan: Tina [name] continues to appear able and willing to participate as evidenced by engagement in reciprocal conversation, and asking questions for clarification as appropriate. Per Tina [name]'s request, the next appointment will be scheduled in one week, which will be via [employer]. Once this provider's office resumes in-person appointments, Tina [name] will be notified. The next session will focus on reviewing hunger patterns and exploring triggers for emotional eating.

## 2018-05-25 ENCOUNTER — Encounter (INDEPENDENT_AMBULATORY_CARE_PROVIDER_SITE_OTHER): Payer: Self-pay | Admitting: Family Medicine

## 2018-05-25 ENCOUNTER — Other Ambulatory Visit: Payer: Self-pay

## 2018-05-25 ENCOUNTER — Ambulatory Visit (INDEPENDENT_AMBULATORY_CARE_PROVIDER_SITE_OTHER): Payer: Self-pay | Admitting: Family Medicine

## 2018-05-25 DIAGNOSIS — E1140 Type 2 diabetes mellitus with diabetic neuropathy, unspecified: Secondary | ICD-10-CM

## 2018-05-25 DIAGNOSIS — Z6839 Body mass index (BMI) 39.0-39.9, adult: Secondary | ICD-10-CM

## 2018-05-25 DIAGNOSIS — Z794 Long term (current) use of insulin: Secondary | ICD-10-CM

## 2018-05-25 DIAGNOSIS — E559 Vitamin D deficiency, unspecified: Secondary | ICD-10-CM

## 2018-05-25 NOTE — Progress Notes (Signed)
Office: [phone]  /  Fax: [phone] TeleHealth Visit:  Tina [name] has verbally consented to this TeleHealth visit today. The patient is located at home, the provider is located at the [employer] [facility] office. The participants in this visit include the listed provider and patient. The visit was conducted today via Webex.  HPI:   Chief Complaint: OBESITY Tina [name] is here to discuss her progress with her obesity treatment plan. She is on the Category 3 plan and is following her eating plan approximately 25% of the time. She states she is walking 15 minutes 7 times per week. Tina [name] states her weight is 237 lbs, which is up 6 lbs since her last visit. She has been skipping meals and eating sweets. She also reports she has not been meal prepping. She states she is now back on track starting today. She is not hungry at all due to Victoza prescribed by endocrinology. We were unable to weigh the patient today for this TeleHealth visit. She feels as if she has gained weight since her last visit but is not sure how much. She has lost 5 lbs since starting treatment with Tina [name].  Diabetes Mellitus with Neuropathy, on Insulin Jaedin has a diagnosis of diabetes mellitus with neuropathy on insulin.  She recently had a visit with Dr. Garnet Koyanagi, her endocrinologist. She is on Basaglar (20 units), NovoLog (5-15), and Victoza 1.2 mg daily. Her Basaglar was decreased to 20 units by her endocrinologist. Last A1c was 6.5 on 05/11/2018 (not in Epic). She has been working on intensive lifestyle modifications including diet, exercise, and weight loss to help control her blood glucose levels.  Vitamin D deficiency Tina [name] has a diagnosis of Vitamin D deficiency, which was still low on 05/11/2018 when checked by her endocrinologist. She is currently taking prescription Vit D and denies nausea, vomiting or muscle weakness. She does report fatigue.  ASSESSMENT AND PLAN:  Type 2 diabetes mellitus with  diabetic neuropathy, with long-term current use of insulin (HCC)  Vitamin D deficiency  Class 2 severe obesity with serious comorbidity and body mass index (BMI) of 39.0 to 39.9 in adult, unspecified obesity type ([place])  PLAN:  Diabetes Mellitus with Neuropathy, on Insulin Eyvonne has been given extensive diabetes education by myself today including ideal fasting and post-prandial blood glucose readings, individual ideal HgA1c goals  and hypoglycemia prevention. We discussed the importance of good blood sugar control to decrease the likelihood of diabetic complications such as nephropathy, neuropathy, limb loss, blindness, coronary artery disease, and death. We discussed the importance of intensive lifestyle modification including diet, exercise and weight loss as the first line treatment for diabetes. Tina [name] agrees to continue her diabetes medications and will follow-up at the agreed upon time.  Vitamin D Deficiency Tina [name] was informed that low Vitamin D levels contributes to fatigue and are associated with obesity, breast, and colon cancer. She agrees to continue to take prescription Vit D (no prescription needed) and will follow-up for routine testing of Vitamin D, at least 2-3 times per year. She was informed of the risk of over-replacement of Vitamin D and agrees to not increase her dose unless she discusses this with Tina [name] first. Magen agrees to follow-up with our clinic in 2 weeks.  I spent > than 50% of the 15 minute visit on counseling as documented in the note.  Obesity Tina [name] is currently in the action stage of change. As such, her goal is to continue with weight loss efforts. She has agreed to follow  the Category 3 plan. Tina [name] has been instructed to continue her current exercise regimen for weight loss and overall health benefits. We discussed the following Behavioral Modification Strategies today: increasing lean protein intake, decreasing simple carbohydrates, no skipping meals,  work on meal planning, easy cooking plans, and planning for success.  Tina [name] has agreed to follow-up with our clinic in 2 weeks. She was informed of the importance of frequent follow-up visits to maximize her success with intensive lifestyle modifications for her multiple health conditions.  ALLERGIES: Allergies  Allergen Reactions  . Contrast Media [Iodinated Diagnostic Agents] Other (See Comments)    Difficulty breathing  . Iohexol Hives, Nausea And Vomiting and Swelling     Desc: Magnevist-gadolinium-difficulty breathing, throat swelling   . Midazolam Hcl Anaphylaxis    Difficulty breathing  . Shellfish Allergy Anaphylaxis  . Valsartan Swelling  . Metformin And Related Diarrhea and Nausea And Vomiting  . Other Itching    Patient is allergic to all nuts except peanuts.   . Avandia [Rosiglitazone Maleate] Hives and Other (See Comments)  . Geodon [Ziprasidone] Other (See Comments)    UNKNOWN  . Kiwi Extract Itching and Swelling  . Latex Itching    MEDICATIONS: Current Outpatient Medications on File Prior to Visit  Medication Sig Dispense Refill  . allopurinol (ZYLOPRIM) 100 MG tablet Take 100 mg by mouth every evening.    Marland Kitchen amLODipine (NORVASC) 10 MG tablet Take 1 tablet (10 mg total) by mouth daily. 30 tablet 0  . aspirin 325 MG tablet Take 325 mg by mouth at bedtime.     Marland Kitchen atorvastatin (LIPITOR) 10 MG tablet TAKE 1 TABLET BY MOUTH EVERY DAY (Patient not taking: Reported on 04/20/2018) 30 tablet 0  . cetirizine (ZYRTEC) 10 MG tablet Take 10 mg by mouth daily as needed for allergies.   11  . cloNIDine (CATAPRES) 0.1 MG tablet Take 0.1 mg by mouth 2 (two) times daily.  0  . colchicine 0.6 MG tablet Take 0.6 mg by mouth daily as needed (For gout flare-up.).   1  . EPINEPHrine 0.3 mg/0.3 mL IJ SOAJ injection INJECT AS DIRECTED AS NEEDED FOR ALLERGIC REACTION (Patient not taking: Reported on 04/29/2018) 1 Device 2  . fluticasone (FLONASE) 50 MCG/ACT nasal spray     . furosemide  (LASIX) 40 MG tablet Take 40 mg by mouth daily.     Marland Kitchen gabapentin (NEURONTIN) 600 MG tablet Take 600-1,200 mg by mouth See admin instructions. Take 1 tablets every morning, 1 tablet at noon and 2 tablets every evening     . hydrochlorothiazide (HYDRODIURIL) 50 MG tablet Take 50 mg by mouth daily.     . insulin aspart (NOVOLOG) 100 UNIT/ML injection Inject 5-15 Units into the skin 3 (three) times daily with meals. Per sliding scale--pt uses Omnipod and gets a basal rate     . Insulin Glargine (BASAGLAR KWIKPEN) 100 UNIT/ML SOPN Inject 20 Units into the skin daily.     . Liraglutide (VICTOZA) 18 MG/3ML SOPN Inject 1.2 mg into the skin daily.    . mometasone (ELOCON) 0.1 % cream 1-2 times per day (Patient not taking: Reported on 04/29/2018) 45 g 1  . omeprazole (PRILOSEC) 40 MG capsule TAKE 1 CAPSULE BY MOUTH EVERY DAY 90 capsule 1  . ondansetron (ZOFRAN) 4 MG tablet Take 1 tablet (4 mg total) by mouth every 8 (eight) hours as needed for up to 20 doses for nausea or vomiting. (Patient not taking: Reported on 04/29/2018) 20 tablet 0  .  propranolol (INDERAL) 20 MG tablet Take 20 mg by mouth daily.    Marland Kitchen spironolactone (ALDACTONE) 50 MG tablet Take 1 tablet (50 mg total) by mouth daily. 90 tablet 3  . tiZANidine (ZANAFLEX) 2 MG tablet TAKE 1 TABLET AT BEDTIME DAILY. CAN TAKE 1/2 TABLET IN THE AM AS NEEDED (Patient taking differently: Take 1-2 mg by mouth See admin instructions. Take 2mg  at bedtime daily. Can take 1mg  in the morning as needed for muscle spasms.) 135 tablet 1  . traMADol (ULTRAM) 50 MG tablet TAKE 1 TABLET BY MOUTH 3 TIMES A DAY AS NEEDED (Patient taking differently: Take 50 mg by mouth 3 (three) times daily. ) 90 tablet 3  . Vitamin D, Ergocalciferol, (DRISDOL) 1.25 MG (50000 UT) CAPS capsule Take 1 capsule (50,000 Units total) by mouth every 7 (seven) days. 4 capsule 0   No current facility-administered medications on file prior to visit.     PAST MEDICAL HISTORY: Past Medical History:   Diagnosis Date  . Allergy   . Anemia    DURING MENSES--HAS HEAVY BLEEDING WITH PERODS  . Anxiety   . Back pain, chronic    "ongoing"  . Blood transfusion    IN 2012  AFTER C -SECTION  . Cerebral thrombosis with cerebral infarction [facility]) JUNE 2011   RIGHT SIDED WEAKNESS ( ARM AND LEG ) AND SPASMS-remains with slight weakness and vertigo.  . Constipation   . Depression   . Diabetes mellitus   . Diabetic neuropathy ([place])    BOTH FEET --COMES AND GOES  . Edema, lower extremity   . Endometriosis   . Fatty liver   . GERD (gastroesophageal reflux disease)    with pregnancy  . H/O eye surgery   . Headache(784.0)    MIGRAINES--NOT REALLY HEADACHE-MORE LIKE PRESSURE SENSATION IN HEAD-FEELS DIZZIY AND  FAINT AS THE PRESSURE RESOLVES  . Hernia, incisional, RLQ, s/p lap repair Sep 2013 09/02/2011  . History of vertigo 03/22/2018  . Hx of migraines 10/19/2011  . Hypertension   . Leg pain, right    "like bad Charley horse"  . Multiple food allergies   . Panic disorder without agoraphobia   . Rash    HANDS, ARMS --STATES HX OF RASH EVER SINCE CHILDBIRTH/PREGNANCY.  STATES THE RASH OFTEN OCCURS WHEN SHE IS REALLY STRESSED."goes and comes-presently left ring finger"  . Restless leg syndrome    DIAGNOSED BY SLEEP STUDY - PT TOLD SHE DID NOT HAVE SLEEP APNEA  . Right rotator cuff tear    PAIN IN RIGHT SHOULDER  . SBO (small bowel obstruction) ([place]) 06/03/2012  . Shortness of breath   . Spastic hemiplegia affecting dominant side ([place])   . Stomach problems   . Stroke ([place])   . Ventral hernia    RIGHT LOWER QUADRANT-CAUSING SOME PAIN  . Weakness of right side of body     PAST SURGICAL HISTORY: Past Surgical History:  Procedure Laterality Date  . APPLICATION OF WOUND VAC N/A 05/25/2015   Procedure: APPLICATION OF WOUND VAC;  Surgeon: Michael Boston, MD;  Location: [facility];  Service: General;  Laterality: N/A;  . CESAREAN SECTION  2012  . COLONOSCOPY    . DIAGNOSTIC LAPAROSCOPY    .  ESOPHAGOGASTRODUODENOSCOPY N/A 06/03/2012   Procedure: ESOPHAGOGASTRODUODENOSCOPY (EGD);  Surgeon: [name] Craver, MD;  Location: [facility] ENDOSCOPY;  Service: Endoscopy;  Laterality: N/A;  . EXCISION MASS ABDOMINAL N/A 05/25/2015   Procedure: ABDOMINAL WALL EXPLORATION EXCISION OF SEROMA REMOVAL OF REDUNDANT SKIN ;  Surgeon: Remo Lipps  Gross, MD;  Location: [facility];  Service: General;  Laterality: N/A;  . EYE SURGERY     Eye laser for vessel hemorrhaging  . fybroid removal    . HERNIA REPAIR  10/03/11   ventral hernia repair  . INSERTION OF MESH N/A 02/04/2013   Procedure: INSERTION OF MESH;  Surgeon: Adin Hector, MD;  Location: [facility];  Service: General;  Laterality: N/A;  . UMBILICAL HERNIA REPAIR N/A 02/04/2013   Procedure: LAPAROSCOPIC ventral wall hernia repair LAPAROSCOPIC LYSIS OF ADHESIONS laparoscopic exploration of abdomen ;  Surgeon: Adin Hector, MD;  Location: [facility];  Service: General;  Laterality: N/A;  . UPPER GASTROINTESTINAL ENDOSCOPY    . URETER REVISION     Bilateral "twisted"  . UTERINE FIBROID SURGERY     2 SURGERIES FOR FIBROIDS  . VENTRAL HERNIA REPAIR  10/03/2011   Procedure: LAPAROSCOPIC VENTRAL HERNIA;  Surgeon: Adin Hector, MD;  Location: [facility];  Service: General;  Laterality: N/A;    SOCIAL HISTORY: Social History   Tobacco Use  . Smoking status: Never Smoker  . Smokeless tobacco: Never Used  Substance Use Topics  . Alcohol use: No    Alcohol/week: 0.0 standard drinks  . Drug use: No    FAMILY HISTORY: Family History  Problem Relation Age of Onset  . Diabetes Father   . Kidney disease Father   . Depression Father   . Drug abuse Father   . Allergic rhinitis Mother   . Eczema Mother   . Urticaria Mother   . Depression Mother   . Anxiety disorder Mother   . Bipolar disorder Mother   . Alcoholism Mother   . Drug abuse Mother   . Eating disorder Mother   . Diabetes Maternal Grandmother   . Hyperlipidemia Paternal Grandmother   . Stroke Paternal  Grandmother   . Eczema Sister   . Urticaria Sister   . Colon cancer Paternal Uncle   . Other Neg Hx   . Angioedema Neg Hx   . Asthma Neg Hx   . Colon polyps Neg Hx   . Esophageal cancer Neg Hx   . Rectal cancer Neg Hx   . Stomach cancer Neg Hx    ROS: Review of Systems  Constitutional: Positive for malaise/fatigue.  Gastrointestinal: Negative for nausea and vomiting.  Musculoskeletal:       Negative for muscle weakness.   PHYSICAL EXAM: Pt in no acute distress  RECENT LABS AND TESTS: BMET    Component Value Date/Time   NA 137 04/20/2018 1537   NA 141 11/03/2017 1310   K 3.6 04/20/2018 1537   CL 99 04/20/2018 1537   CO2 25 04/20/2018 1537   GLUCOSE 240 (H) 04/20/2018 1537   BUN 22 (H) 04/20/2018 1537   BUN 11 11/03/2017 1310   CREATININE 1.11 (H) 04/20/2018 1537   CALCIUM 10.1 04/20/2018 1537   GFRNONAA 59 (L) 04/20/2018 1537   GFRAA >60 04/20/2018 1537   Lab Results  Component Value Date   HGBA1C 8.5 (H) 02/15/2018   HGBA1C 8.4 (H) 11/03/2017   HGBA1C 7.5 (H) 05/18/2015   HGBA1C 8.7 (H) 02/05/2013   HGBA1C 6.9 (H) 06/03/2012   No results found for: INSULIN CBC    Component Value Date/Time   WBC 13.1 (H) 04/20/2018 1537   RBC 5.26 (H) 04/20/2018 1537   HGB 13.5 04/20/2018 1537   HCT 42.4 04/20/2018 1537   PLT 348 04/20/2018 1537   MCV 80.6 04/20/2018 1537  MCH 25.7 (L) 04/20/2018 1537   MCHC 31.8 04/20/2018 1537   RDW 14.6 04/20/2018 1537   LYMPHSABS 1.3 05/11/2017 1323   MONOABS 0.6 05/11/2017 1323   EOSABS 0.2 05/11/2017 1323   BASOSABS 0.0 05/11/2017 1323   Iron/TIBC/Ferritin/ %Sat    Component Value Date/Time   IRON 114 11/01/2015 1031   TIBC 372 11/01/2015 1031   FERRITIN 35 11/01/2015 1031   IRONPCTSAT 31 11/01/2015 1031   Lipid Panel     Component Value Date/Time   CHOL 169 02/15/2018 1034   TRIG 168 (H) 02/15/2018 1034   HDL 41 02/15/2018 1034   CHOLHDL 6.0 10/17/2011 0315   VLDL 59 (H) 10/17/2011 0315   LDLCALC 94 02/15/2018  1034   Hepatic Function Panel     Component Value Date/Time   PROT 8.0 04/20/2018 1537   PROT 6.8 11/03/2017 1310   ALBUMIN 3.7 04/20/2018 1537   ALBUMIN 3.8 11/03/2017 1310   AST 18 04/20/2018 1537   ALT 14 04/20/2018 1537   ALKPHOS 60 04/20/2018 1537   BILITOT 0.8 04/20/2018 1537   BILITOT 0.3 11/03/2017 1310   BILIDIR <0.1 06/03/2012 0220   IBILI NOT CALCULATED 06/03/2012 0220      Component Value Date/Time   TSH 0.947 11/03/2017 1310   TSH 2.020 09/08/2017 1129   TSH 1.045 06/03/2012 0520   Results for NEILANI, DUFFEE (MRN [id number]) as of 05/25/2018 13:41  Ref. Range 02/15/2018 10:34  Vitamin D, 25-Hydroxy Latest Ref Range: 30.0 - 100.0 ng/mL 26.5 (L)    I, Michaelene Song, am acting as Location manager for [employer], FNP-C.  I have reviewed the above documentation for accuracy and completeness, and I agree with the above.  - Kaelie Henigan, FNP-C.

## 2018-05-26 ENCOUNTER — Other Ambulatory Visit: Payer: Self-pay | Admitting: Cardiology

## 2018-05-26 NOTE — Telephone Encounter (Signed)
Please fill

## 2018-05-27 ENCOUNTER — Telehealth: Payer: Self-pay | Admitting: Hematology and Oncology

## 2018-05-27 NOTE — Telephone Encounter (Signed)
Received a referral from Dr. Theda Sers at [facility] for Stroke at age 48 (no birth control) and ischemic colitis at 52 ?hypercoaguable state. Pt has been cld and scheduled to see Dr. Lindi Adie on 5/21 at 1pm. She's been made aware to arrive 15 minutes early.

## 2018-05-28 DIAGNOSIS — E1140 Type 2 diabetes mellitus with diabetic neuropathy, unspecified: Secondary | ICD-10-CM | POA: Diagnosis not present

## 2018-05-28 DIAGNOSIS — Z794 Long term (current) use of insulin: Secondary | ICD-10-CM | POA: Diagnosis not present

## 2018-05-28 DIAGNOSIS — E118 Type 2 diabetes mellitus with unspecified complications: Secondary | ICD-10-CM | POA: Diagnosis not present

## 2018-05-31 ENCOUNTER — Other Ambulatory Visit: Payer: Self-pay | Admitting: Cardiology

## 2018-05-31 DIAGNOSIS — R0609 Other forms of dyspnea: Secondary | ICD-10-CM

## 2018-05-31 DIAGNOSIS — I10 Essential (primary) hypertension: Secondary | ICD-10-CM

## 2018-05-31 MED ORDER — SPIRONOLACTONE 50 MG PO TABS: 50.0000 mg | ORAL_TABLET | Freq: Every day | ORAL | 3 refills | Status: DC

## 2018-06-01 ENCOUNTER — Other Ambulatory Visit: Payer: Self-pay

## 2018-06-01 ENCOUNTER — Other Ambulatory Visit: Payer: Self-pay | Admitting: Cardiology

## 2018-06-01 ENCOUNTER — Ambulatory Visit (INDEPENDENT_AMBULATORY_CARE_PROVIDER_SITE_OTHER): Payer: HMO | Admitting: Psychology

## 2018-06-01 DIAGNOSIS — F3289 Other specified depressive episodes: Secondary | ICD-10-CM | POA: Diagnosis not present

## 2018-06-01 NOTE — Telephone Encounter (Signed)
She is on 50 mg and I sent it yesterday

## 2018-06-01 NOTE — Progress Notes (Addendum)
Office: [phone]  /  Fax: [phone]    Date: Jun 01, 2018    Appointment Start Time: 9:06am Duration: 24 minutes Provider: Glennie Isle, Psy.D. Type of Session: Individual Therapy  Location of Patient: Home Location of Provider: [facility] Type of Contact: Telepsychological Visit via [employer] WebEx   Session Content: Tina [name] is a 48 y.o. female presenting via [place] for a follow-up appointment to address the previously established treatment goal of decreasing emotional eating. Today's appointment was a telepsychological visit, as this provider's clinic is closed for in-person visits due to COVID-19. Therapeutic services will resume to in-person appointments once the clinic re-opens. Tina [name] expressed understanding regarding the rationale for telepsychological services, and provided verbal consent for today's appointment. Prior to proceeding with today's appointment, Tina [name]'s physical location at the time of this appointment was obtained. Tina [name] reported she was at home and provided the address. In the event of technical difficulties, Tina [name] shared a phone number she could be reached at. Tina [name] and this provider participated in today's telepsychological service. Also, Tina [name] denied anyone else being present in the room or on the WebEx appointment. Of note, this provider called Tina [name] at 9:02am as she did not present for her WebEx appointment. The e-mail was re-sent and she was provided instructions to join today's appointment.   This provider conducted a brief check-in and verbally administered the PHQ-9 and GAD-7. Gratia shared, "I think the last week has been more of a roller coaster." She discussed engaging in meditation in the mornings and evenings, which she noted has been helpful. Acsa reported she realized at night she tends to snack; therefore, she tries to meditate to help cope. Alzena clarified life has been a "roller coaster" due to her emotions  being "up an down." She explained she has been dealing with pain secondary to the stroke she suffered. She reported she has talked to her PCP to address pain concerns. Notably, Tina [name] has an appointment with her psychiatrist on Jun 10, 2018 at 4:00pm. She noted she will complete and return the authorizations previously sent. Regarding eating, Tina [name] discussed fluctuations in her appetite resulting in her son frequently reminding her to eat. Notably, she discussed engaging in meal prep, which she has described as helpful. However, she felt she "failed" at meal prep. Thus, psychoeducation regarding all or nothing thinking was provided. In addition, Tina [name] was engaged in thought challenging and reframing via discussing evidence for and evidence against the aforementioned thought. Following the exercise, this provider explored what stood out to Tina [name]. She noted, "I ate the food I prepared. I actually prepared the food." Subsequently, she developed a more balanced thought of "I succeeded 75% of the time." She added, "I feel a little better that I actually got some of that." She was encouraged to engage in the aforementioned as needed to improve coping; Tina [name] agreed. Moreover, a goal of meal prepping lunches 85% of the time between now and the next appointment was established. Furthermore, session concluded with a brief review of triggers for emotional eating, which will be further discussed at the next appointment. Tina [name] provided verbal consent during today's appointment for this provider to send the handout for triggers via e-mail. Tina [name] was receptive to today's session as evidenced by openness to sharing, responsiveness to feedback, and engagement in thought challenging.  Mental Status Examination:  Appearance: neat Behavior: cooperative Mood: euthymic Affect: mood congruent Speech: normal in rate, volume, and tone Eye Contact: appropriate Psychomotor Activity: appropriate Thought Process: linear,  logical,  and goal directed  Content/Perceptual Disturbances: denies suicidal and homicidal ideation, plan, and intent and no hallucinations, delusions, bizarre thinking or behavior reported or observed Orientation: time, person, place and purpose of appointment Cognition/Sensorium: memory, attention, language, and fund of knowledge intact  Insight: good Judgment: good  Structured Assessment Results: The Patient Health Questionnaire-9 (PHQ-9) is a self-report measure that assesses symptoms and severity of depression over the course of the past two weeks; however, today's administration focused on the last week. Tina [name] obtained a score of 6 suggesting mild depression. Tina [name] finds the endorsed symptoms to be somewhat difficult. Decreased interest 0  Down, depressed, hopeless 1  Altered sleeping 1  Tired, decreased energy 1  Change in appetite 1  Feeling bad or failure about yourself 1  Trouble concentrating 1  Moving slowly or fidgety/restless 0  Suicidal thoughts 0  PHQ-9 Score 6    The Generalized Anxiety Disorder-7 (GAD-7) is a brief self-report measure that assesses symptoms of anxiety over the course of the past two weeks; however, today's administration focused on the past week. Tina [name] obtained a score of 6 suggesting mild anxiety. Tina [name] finds the endorsed symptoms to be somewhat difficult. Nervous, anxious, on edge 1  Control/stop worrying 1  Worrying too much- different things 1  Trouble relaxing 1  Restless 1  Easily annoyed or irritable 1  Afraid-awful might happen 0  GAD-7 Score 6   Interventions:  Verbal administration of PHQ-9 and GAD-7 for symptom monitoring Reviewed content from the previous session Provided empathic reflections and validation Provided psychoeducation regarding all-or-nothing thinking Engaged patient in goal setting Provided positive reinforcement Engaged patient in cognitive reframing Conducted a brief chart review  DSM-5 Diagnosis: 311  (F32.8) Other Specified Depressive Disorder, Emotional Eating Behaviorsand Anxious Distress  Treatment Goal & Progress: During the initial appointment with this provider, the following treatment goal was established: decrease emotional eating. Tina [name] has demonstrated progress in her goal as evidenced by increased awareness of hunger patterns and willingness to engage in learned skills.   Plan: Tina [name] continues to appear able and willing to participate as evidenced by engagement in reciprocal conversation, and asking questions for clarification as appropriate. The next appointment will be scheduled in two weeks, which will be via Tina [name]. Once this provider's office resumes in-person appointments, Deneisha will be notified. The next session will focus on discussing triggers for emotional eating, as they were not discussed today due to [place]'s presenting concerns.

## 2018-06-01 NOTE — Telephone Encounter (Signed)
Please fill

## 2018-06-01 NOTE — Telephone Encounter (Signed)
Thank you disregard

## 2018-06-02 DIAGNOSIS — Z961 Presence of intraocular lens: Secondary | ICD-10-CM | POA: Diagnosis not present

## 2018-06-02 DIAGNOSIS — E113552 Type 2 diabetes mellitus with stable proliferative diabetic retinopathy, left eye: Secondary | ICD-10-CM | POA: Diagnosis not present

## 2018-06-02 DIAGNOSIS — E113511 Type 2 diabetes mellitus with proliferative diabetic retinopathy with macular edema, right eye: Secondary | ICD-10-CM | POA: Diagnosis not present

## 2018-06-02 DIAGNOSIS — H3561 Retinal hemorrhage, right eye: Secondary | ICD-10-CM | POA: Diagnosis not present

## 2018-06-02 NOTE — Progress Notes (Signed)
[place] NOTE  Patient Care Team: [name], DO as PCP - General (Family Medicine) Servando Salina, MD as Consulting Physician (Obstetrics and Gynecology) Garvin Fila, MD as Consulting Physician (Neurology) [name] Craver, MD as Consulting Physician (Gastroenterology) Dimitri Ped, RN as [place] [facility]  CHIEF COMPLAINTS/PURPOSE OF CONSULTATION: History of stroke and ischemic colitis   HISTORY OF PRESENTING ILLNESS:  Tina [name] 48 y.o. female is here because of a history of stroke, at 48yo not on birth control, and ischemic colitis. She was referred by Dr. Theda Sers at [facility]. She is on ASA 81mg  daily, famotidine 20mg  daily, and 40mg  omeprazole daily for her ischemic colitis. She has a history of HTN and diabetes.  She has had problems with GI bleeding and has had endoscopies and colonoscopies.  There was no clear-cut etiology.  She had a prior history of ischemic colitis. She presents to the clinic today for initial consultation and further hypercoagulable workup.   I reviewed her records extensively and collaborated the history with the patient.  MEDICAL HISTORY:  Past Medical History:  Diagnosis Date  . Allergy   . Anemia    DURING MENSES--HAS HEAVY BLEEDING WITH PERODS  . Anxiety   . Back pain, chronic    "ongoing"  . Blood transfusion    IN 2012  AFTER C -SECTION  . Cerebral thrombosis with cerebral infarction [facility]) JUNE 2011   RIGHT SIDED WEAKNESS ( ARM AND LEG ) AND SPASMS-remains with slight weakness and vertigo.  . Constipation   . Depression   . Diabetes mellitus   . Diabetic neuropathy ([place])    BOTH FEET --COMES AND GOES  . Edema, lower extremity   . Endometriosis   . Fatty liver   . GERD (gastroesophageal reflux disease)    with pregnancy  . H/O eye surgery   . Headache(784.0)    MIGRAINES--NOT REALLY HEADACHE-MORE LIKE PRESSURE SENSATION IN HEAD-FEELS DIZZIY AND  FAINT AS  THE PRESSURE RESOLVES  . Hernia, incisional, RLQ, s/p lap repair Sep 2013 09/02/2011  . History of vertigo 03/22/2018  . Hx of migraines 10/19/2011  . Hypertension   . Leg pain, right    "like bad Charley horse"  . Multiple food allergies   . Panic disorder without agoraphobia   . Rash    HANDS, ARMS --STATES HX OF RASH EVER SINCE CHILDBIRTH/PREGNANCY.  STATES THE RASH OFTEN OCCURS WHEN SHE IS REALLY STRESSED."goes and comes-presently left ring finger"  . Restless leg syndrome    DIAGNOSED BY SLEEP STUDY - PT TOLD SHE DID NOT HAVE SLEEP APNEA  . Right rotator cuff tear    PAIN IN RIGHT SHOULDER  . SBO (small bowel obstruction) ([place]) 06/03/2012  . Shortness of breath   . Spastic hemiplegia affecting dominant side ([place])   . Stomach problems   . Stroke ([place])   . Ventral hernia    RIGHT LOWER QUADRANT-CAUSING SOME PAIN  . Weakness of right side of body     SURGICAL HISTORY: Past Surgical History:  Procedure Laterality Date  . APPLICATION OF WOUND VAC N/A 05/25/2015   Procedure: APPLICATION OF WOUND VAC;  Surgeon: Michael Boston, MD;  Location: [facility]S;  Service: General;  Laterality: N/A;  . CESAREAN SECTION  2012  . COLONOSCOPY    . DIAGNOSTIC LAPAROSCOPY    . ESOPHAGOGASTRODUODENOSCOPY N/A 06/03/2012   Procedure: ESOPHAGOGASTRODUODENOSCOPY (EGD);  Surgeon: [name] Craver, MD;  Location: [facility] ENDOSCOPY;  Service: Endoscopy;  Laterality: N/A;  .  EXCISION MASS ABDOMINAL N/A 05/25/2015   Procedure: ABDOMINAL WALL EXPLORATION EXCISION OF SEROMA REMOVAL OF REDUNDANT SKIN ;  Surgeon: Michael Boston, MD;  Location: [facility];  Service: General;  Laterality: N/A;  . EYE SURGERY     Eye laser for vessel hemorrhaging  . fybroid removal    . HERNIA REPAIR  10/03/11   ventral hernia repair  . INSERTION OF MESH N/A 02/04/2013   Procedure: INSERTION OF MESH;  Surgeon: Adin Hector, MD;  Location: [facility];  Service: General;  Laterality: N/A;  . UMBILICAL HERNIA REPAIR N/A 02/04/2013   Procedure: LAPAROSCOPIC  ventral wall hernia repair LAPAROSCOPIC LYSIS OF ADHESIONS laparoscopic exploration of abdomen ;  Surgeon: Adin Hector, MD;  Location: [facility];  Service: General;  Laterality: N/A;  . UPPER GASTROINTESTINAL ENDOSCOPY    . URETER REVISION     Bilateral "twisted"  . UTERINE FIBROID SURGERY     2 SURGERIES FOR FIBROIDS  . VENTRAL HERNIA REPAIR  10/03/2011   Procedure: LAPAROSCOPIC VENTRAL HERNIA;  Surgeon: Adin Hector, MD;  Location: [facility];  Service: General;  Laterality: N/A;    SOCIAL HISTORY: Social History   Socioeconomic History  . Marital status: Married    Spouse name: Annie Main  . Number of children: 3  . Years of education: BA degree  . Highest education level: Not on file  Occupational History  . Occupation: stay at home mom    Employer: UNEMPLOYED  Social Needs  . Financial resource strain: Not on file  . Food insecurity:    Worry: Never true    Inability: Never true  . Transportation needs:    Medical: No    Non-medical: No  Tobacco Use  . Smoking status: Never Smoker  . Smokeless tobacco: Never Used  Substance and Sexual Activity  . Alcohol use: No    Alcohol/week: 0.0 standard drinks  . Drug use: No  . Sexual activity: Yes    Birth control/protection: None  Lifestyle  . Physical activity:    Days per week: Not on file    Minutes per session: Not on file  . Stress: Not on file  Relationships  . Social connections:    Talks on phone: Not on file    Gets together: Not on file    Attends religious service: Not on file    Active member of club or organization: Not on file    Attends meetings of clubs or organizations: Not on file    Relationship status: Not on file  . Intimate partner violence:    Fear of current or ex partner: Not on file    Emotionally abused: Not on file    Physically abused: Not on file    Forced sexual activity: Not on file  Other Topics Concern  . Not on file  Social History Narrative   Patient is married with 2 children.    Patient is right handed.   Patient has her  BA degree.   Patient drinks 1 cup daily.    FAMILY HISTORY: Family History  Problem Relation Age of Onset  . Diabetes Father   . Kidney disease Father   . Depression Father   . Drug abuse Father   . Allergic rhinitis Mother   . Eczema Mother   . Urticaria Mother   . Depression Mother   . Anxiety disorder Mother   . Bipolar disorder Mother   . Alcoholism Mother   . Drug abuse Mother   .  Eating disorder Mother   . Diabetes Maternal Grandmother   . Hyperlipidemia Paternal Grandmother   . Stroke Paternal Grandmother   . Eczema Sister   . Urticaria Sister   . Colon cancer Paternal Uncle   . Other Neg Hx   . Angioedema Neg Hx   . Asthma Neg Hx   . Colon polyps Neg Hx   . Esophageal cancer Neg Hx   . Rectal cancer Neg Hx   . Stomach cancer Neg Hx     ALLERGIES:  is allergic to contrast media [iodinated diagnostic agents]; iohexol; midazolam hcl; shellfish allergy; valsartan; metformin and related; other; avandia [rosiglitazone maleate]; geodon [ziprasidone]; kiwi extract; and latex.  MEDICATIONS:  Current Outpatient Medications  Medication Sig Dispense Refill  . allopurinol (ZYLOPRIM) 100 MG tablet Take 100 mg by mouth every evening.    Marland Kitchen amLODipine (NORVASC) 10 MG tablet TAKE 1 TABLET BY MOUTH EVERY DAY 90 tablet 1  . aspirin 325 MG tablet Take 325 mg by mouth at bedtime.     Marland Kitchen atorvastatin (LIPITOR) 10 MG tablet TAKE 1 TABLET BY MOUTH EVERY DAY (Patient not taking: Reported on 04/20/2018) 30 tablet 0  . cetirizine (ZYRTEC) 10 MG tablet Take 10 mg by mouth daily as needed for allergies.   11  . cloNIDine (CATAPRES) 0.1 MG tablet Take 0.1 mg by mouth 2 (two) times daily.  0  . colchicine 0.6 MG tablet Take 0.6 mg by mouth daily as needed (For gout flare-up.).   1  . EPINEPHrine 0.3 mg/0.3 mL IJ SOAJ injection INJECT AS DIRECTED AS NEEDED FOR ALLERGIC REACTION (Patient not taking: Reported on 04/29/2018) 1 Device 2  . fluticasone  (FLONASE) 50 MCG/ACT nasal spray     . furosemide (LASIX) 40 MG tablet Take 40 mg by mouth daily.     Marland Kitchen gabapentin (NEURONTIN) 600 MG tablet Take 600-1,200 mg by mouth See admin instructions. Take 1 tablets every morning, 1 tablet at noon and 2 tablets every evening     . hydrochlorothiazide (HYDRODIURIL) 50 MG tablet Take 50 mg by mouth daily.     . insulin aspart (NOVOLOG) 100 UNIT/ML injection Inject 5-15 Units into the skin 3 (three) times daily with meals. Per sliding scale--pt uses Omnipod and gets a basal rate     . Insulin Glargine (BASAGLAR KWIKPEN) 100 UNIT/ML SOPN Inject 20 Units into the skin daily.     . Liraglutide (VICTOZA) 18 MG/3ML SOPN Inject 1.2 mg into the skin daily.    . mometasone (ELOCON) 0.1 % cream 1-2 times per day (Patient not taking: Reported on 04/29/2018) 45 g 1  . omeprazole (PRILOSEC) 40 MG capsule TAKE 1 CAPSULE BY MOUTH EVERY DAY 90 capsule 1  . ondansetron (ZOFRAN) 4 MG tablet Take 1 tablet (4 mg total) by mouth every 8 (eight) hours as needed for up to 20 doses for nausea or vomiting. (Patient not taking: Reported on 04/29/2018) 20 tablet 0  . propranolol (INDERAL) 20 MG tablet Take 20 mg by mouth daily.    Marland Kitchen spironolactone (ALDACTONE) 50 MG tablet Take 1 tablet (50 mg total) by mouth daily. 90 tablet 3  . tiZANidine (ZANAFLEX) 2 MG tablet TAKE 1 TABLET AT BEDTIME DAILY. CAN TAKE 1/2 TABLET IN THE AM AS NEEDED (Patient taking differently: Take 1-2 mg by mouth See admin instructions. Take 2mg  at bedtime daily. Can take 1mg  in the morning as needed for muscle spasms.) 135 tablet 1  . traMADol (ULTRAM) 50 MG tablet TAKE 1 TABLET  BY MOUTH 3 TIMES A DAY AS NEEDED (Patient taking differently: Take 50 mg by mouth 3 (three) times daily. ) 90 tablet 3  . Vitamin D, Ergocalciferol, (DRISDOL) 1.25 MG (50000 UT) CAPS capsule Take 1 capsule (50,000 Units total) by mouth every 7 (seven) days. 4 capsule 0   No current facility-administered medications for this visit.     REVIEW  OF SYSTEMS:   Constitutional: Denies fevers, chills or abnormal night sweats Eyes: Denies blurriness of vision, double vision or watery eyes Ears, nose, mouth, throat, and face: Denies mucositis or sore throat Respiratory: Denies cough, dyspnea or wheezes Cardiovascular: Denies palpitation, chest discomfort or lower extremity swelling Gastrointestinal:  Denies nausea, heartburn or change in bowel habits Skin: Denies abnormal skin rashes Lymphatics: Denies new lymphadenopathy or easy bruising Neurological:Denies numbness, tingling or new weaknesses Behavioral/Psych: Mood is stable, no new changes  Breast: Denies any palpable lumps or discharge All other systems were reviewed with the patient and are negative.  PHYSICAL EXAMINATION: ECOG PERFORMANCE STATUS: 1 - Symptomatic but completely ambulatory  Vitals:   06/03/18 1248  BP: 138/69  Pulse: 77  Resp: 18  Temp: 98.1 F (36.7 C)  SpO2: 100%   Filed Weights   06/03/18 1248  Weight: 234 lb 11.2 oz (106.5 kg)    GENERAL:alert, no distress and comfortable SKIN: skin color, texture, turgor are normal, no rashes or significant lesions EYES: normal, conjunctiva are pink and non-injected, sclera clear OROPHARYNX:no exudate, no erythema and lips, buccal mucosa, and tongue normal  NECK: supple, thyroid normal size, non-tender, without nodularity LYMPH:  no palpable lymphadenopathy in the cervical, axillary or inguinal LUNGS: clear to auscultation and percussion with normal breathing effort HEART: regular rate & rhythm and no murmurs and no lower extremity edema ABDOMEN:abdomen soft, non-tender and normal bowel sounds Musculoskeletal:no cyanosis of digits and no clubbing  PSYCH: alert & oriented x 3 with fluent speech NEURO: Right-sided weakness markedly improved from before  LABORATORY DATA:  I have reviewed the data as listed Lab Results  Component Value Date   WBC 13.1 (H) 04/20/2018   HGB 13.5 04/20/2018   HCT 42.4 04/20/2018    MCV 80.6 04/20/2018   PLT 348 04/20/2018   Lab Results  Component Value Date   NA 137 04/20/2018   K 3.6 04/20/2018   CL 99 04/20/2018   CO2 25 04/20/2018    RADIOGRAPHIC STUDIES: I have personally reviewed the radiological reports and agreed with the findings in the report.  ASSESSMENT AND PLAN:  Hemiparesis affecting right side as late effect of stroke ([place]) History of CVA at the age of 55 and 2012 History of ischemic colitis with GI bleeding 2005 Patient moved from [place] to [place] Has not had any previous hypercoagulability work-up.  Chronic/recurrent blood clots I discussed with the patient risk factors for blood clots.  Inherited risk factors include: 1. Factor V Leiden mutation 2. Prothrombin gene G20210A 3. Protein S deficiency  4. Protein C deficiency  5. Antithrombin deficiency  Acquired risk factors include: 1. Antiphospholipid antibody syndrome 2. Tobacco use: Denies smoking 3. Obesity: Patient understands that she has obesity problem 4. Medications including oral contraceptives: Patient does not take any 5. Sedentary behavior including postoperative state 6. Foreign bodies in circulation: Patient has an abdominal mass but no intravascular foreign bodies.  Workup recommended: Bloodwork to evaluate for the 5 inherited factors mentioned above along with antiphospholipid antibodies. Follow-up next week with a WebEx visit to discuss results.  I instructed the  patient that the results do not change the current management.  We will not start her on anticoagulation because of a hypercoagulable risk factor.  However these results will help risk stratify in the future if she were to need surgery or otherwise.    All questions were answered. The patient knows to call the clinic with any problems, questions or concerns.   Rulon Eisenmenger, MD 06/03/2018    I, Molly Dorshimer, am acting as scribe for Nicholas Lose, MD.  I have reviewed the above  documentation for accuracy and completeness, and I agree with the above.

## 2018-06-03 ENCOUNTER — Inpatient Hospital Stay
Payer: Federal, State, Local not specified - PPO | Attending: Hematology and Oncology | Admitting: Hematology and Oncology

## 2018-06-03 ENCOUNTER — Inpatient Hospital Stay: Payer: Federal, State, Local not specified - PPO

## 2018-06-03 ENCOUNTER — Other Ambulatory Visit: Payer: Self-pay

## 2018-06-03 VITALS — BP 138/69 | HR 77 | Temp 98.1°F | Resp 18 | Ht 65.0 in | Wt 234.7 lb

## 2018-06-03 DIAGNOSIS — Z79899 Other long term (current) drug therapy: Secondary | ICD-10-CM | POA: Diagnosis not present

## 2018-06-03 DIAGNOSIS — Z794 Long term (current) use of insulin: Secondary | ICD-10-CM | POA: Diagnosis not present

## 2018-06-03 DIAGNOSIS — E669 Obesity, unspecified: Secondary | ICD-10-CM | POA: Insufficient documentation

## 2018-06-03 DIAGNOSIS — Z8673 Personal history of transient ischemic attack (TIA), and cerebral infarction without residual deficits: Secondary | ICD-10-CM

## 2018-06-03 DIAGNOSIS — R531 Weakness: Secondary | ICD-10-CM | POA: Diagnosis not present

## 2018-06-03 DIAGNOSIS — I69351 Hemiplegia and hemiparesis following cerebral infarction affecting right dominant side: Secondary | ICD-10-CM | POA: Diagnosis not present

## 2018-06-03 DIAGNOSIS — Z7951 Long term (current) use of inhaled steroids: Secondary | ICD-10-CM | POA: Insufficient documentation

## 2018-06-03 DIAGNOSIS — E1140 Type 2 diabetes mellitus with diabetic neuropathy, unspecified: Secondary | ICD-10-CM | POA: Diagnosis not present

## 2018-06-03 DIAGNOSIS — I10 Essential (primary) hypertension: Secondary | ICD-10-CM

## 2018-06-03 DIAGNOSIS — Z7982 Long term (current) use of aspirin: Secondary | ICD-10-CM | POA: Diagnosis not present

## 2018-06-03 DIAGNOSIS — K559 Vascular disorder of intestine, unspecified: Secondary | ICD-10-CM

## 2018-06-03 DIAGNOSIS — K219 Gastro-esophageal reflux disease without esophagitis: Secondary | ICD-10-CM

## 2018-06-03 NOTE — Assessment & Plan Note (Signed)
History of CVA at the age of 69 and 2012 History of ischemic colitis with GI bleeding 2005 Patient moved from [place] to [place] Has not had any previous hypercoagulability work-up.  Chronic/recurrent blood clots I discussed with the patient risk factors for blood clots.  Inherited risk factors include: 1. Factor V Leiden mutation 2. Prothrombin gene G20210A 3. Protein S deficiency  4. Protein C deficiency  5. Antithrombin deficiency  Acquired risk factors include: 1. Antiphospholipid antibody syndrome 2. Tobacco use: Denies smoking 3. Obesity: Patient understands that she has obesity problem 4. Medications including oral contraceptives: Patient does not take any 5. Sedentary behavior including postoperative state 6. Foreign bodies in circulation: Patient has an abdominal mass but no intravascular foreign bodies.  Workup recommended: Bloodwork to evaluate for the 5 inherited factors mentioned above along with antiphospholipid antibodies. Follow-up next week with a WebEx visit to discuss results.  I instructed the patient that the results do not change the current management.  We will not start her on anticoagulation because of a hypercoagulable risk factor.  However these results will help risk stratify in the future if she were to need surgery or otherwise.

## 2018-06-04 LAB — BETA-2-GLYCOPROTEIN I ABS, IGG/M/A
Beta-2 Glyco I IgG: <9 GPI IgG units (ref 0–20)
Beta-2-Glycoprotein I IgA: <9 GPI IgA units (ref 0–25)
Beta-2-Glycoprotein I IgM: <9 GPI IgM units (ref 0–32)

## 2018-06-04 LAB — CARDIOLIPIN ANTIBODIES, IGG, IGM, IGA
Anticardiolipin IgA: <9 APL U/mL (ref 0–11)
Anticardiolipin IgG: <9 GPL U/mL (ref 0–14)
Anticardiolipin IgM: <9 MPL U/mL (ref 0–12)

## 2018-06-04 LAB — ANTITHROMBIN III ANTIGEN: AT III AG PPP IMM-ACNC: 120 % (ref 72–124)

## 2018-06-04 LAB — LUPUS ANTICOAGULANT PANEL
DRVVT: 24.8 s (ref 0.0–47.0)
PTT Lupus Anticoagulant: 35.2 s (ref 0.0–51.9)

## 2018-06-04 LAB — FACTOR 8 ASSAY: Coagulation Factor VIII: 148 % — ABNORMAL HIGH (ref 56–140)

## 2018-06-04 LAB — PROTEIN S, TOTAL: Protein S Ag, Total: 129 % (ref 60–150)

## 2018-06-05 LAB — PROTEIN C, TOTAL: Protein C, Total: 111 % (ref 60–150)

## 2018-06-08 ENCOUNTER — Encounter (INDEPENDENT_AMBULATORY_CARE_PROVIDER_SITE_OTHER): Payer: Self-pay | Admitting: Family Medicine

## 2018-06-08 ENCOUNTER — Telehealth: Payer: Self-pay | Admitting: Hematology and Oncology

## 2018-06-08 ENCOUNTER — Other Ambulatory Visit: Payer: Self-pay

## 2018-06-08 ENCOUNTER — Ambulatory Visit (INDEPENDENT_AMBULATORY_CARE_PROVIDER_SITE_OTHER): Payer: HMO | Admitting: Family Medicine

## 2018-06-08 DIAGNOSIS — F3289 Other specified depressive episodes: Secondary | ICD-10-CM

## 2018-06-08 DIAGNOSIS — Z6839 Body mass index (BMI) 39.0-39.9, adult: Secondary | ICD-10-CM | POA: Diagnosis not present

## 2018-06-08 NOTE — Telephone Encounter (Signed)
Called patient regarding upcoming Webex appointment, patient is notified and e-mail has been sent. °

## 2018-06-08 NOTE — Progress Notes (Signed)
HEMATOLOGY-ONCOLOGY [place] VISIT PROGRESS NOTE  I connected with Tina [name] on 06/09/2018 at 11:00 AM EDT by Webex video conference and verified that I am speaking with the correct person using two identifiers.  I discussed the limitations, risks, security and privacy concerns of performing an evaluation and management service by Webex and the availability of in person appointments.  I also discussed with the patient that there may be a patient responsible charge related to this service. The patient expressed understanding and agreed to proceed.  Patient's Location: Home Physician Location: Clinic  CHIEF COMPLIANT: Follow-up to discuss lab results and further treatment  INTERVAL HISTORY: Tina [name] is a 48 y.o. female with above-mentioned history of CVA ischemic colitis. She presents today over Webex to review recent workup, which showed a coagulation Factor VIII of 148, but was otherwise negative.   REVIEW OF SYSTEMS:   Constitutional: Denies fevers, chills or abnormal weight loss Eyes: Denies blurriness of vision Ears, nose, mouth, throat, and face: Denies mucositis or sore throat Respiratory: Denies cough, dyspnea or wheezes Cardiovascular: Denies palpitation, chest discomfort Gastrointestinal:  Denies nausea, heartburn or change in bowel habits Skin: Denies abnormal skin rashes Lymphatics: Denies new lymphadenopathy or easy bruising Neurological:Denies numbness, tingling or new weaknesses Behavioral/Psych: Mood is stable, no new changes  Extremities: No lower extremity edema Breast: denies any pain or lumps or nodules in either breasts All other systems were reviewed with the patient and are negative.  Observations/Objective:  There were no vitals filed for this visit. There is no height or weight on file to calculate BMI.  I have reviewed the data as listed CMP Latest Ref Rng & Units 04/20/2018 11/03/2017 09/08/2017  Glucose 70 - 99 mg/dL 240(H) 128(H) -  BUN 6 -  20 mg/dL 22(H) 11 -  Creatinine 0.44 - 1.00 mg/dL 1.11(H) 0.70 -  Sodium 135 - 145 mmol/L 137 141 -  Potassium 3.5 - 5.1 mmol/L 3.6 3.7 -  Chloride 98 - 111 mmol/L 99 100 -  CO2 22 - 32 mmol/L 25 24 -  Calcium 8.9 - 10.3 mg/dL 10.1 9.1 -  Total Protein 6.5 - 8.1 g/dL 8.0 6.8 6.7  Total Bilirubin 0.3 - 1.2 mg/dL 0.8 0.3 -  Alkaline Phos 38 - 126 U/L 60 54 -  AST 15 - 41 U/L 18 14 -  ALT 0 - 44 U/L 14 13 -    Lab Results  Component Value Date   WBC 13.1 (H) 04/20/2018   HGB 13.5 04/20/2018   HCT 42.4 04/20/2018   MCV 80.6 04/20/2018   PLT 348 04/20/2018   NEUTROABS 6.2 05/11/2017      Assessment Plan:  Hemiparesis affecting right side as late effect of stroke ([place]) History of CVA at the age of 56 and 2012 History of ischemic colitis with GI bleeding 2005 Patient moved from [place] to [place]  Hypercoagulability work-up was negative for 1. Factor V Leiden mutation 2. Prothrombin gene G20210A 3. Protein S deficiency  4. Protein C deficiency  5. Antithrombin deficiency 6.  Lupus anticoagulant/APL antibody syndrome  I instructed the patient that she needs to exercise and stay active to prevent risk of future blood clots. Return to clinic on an as-needed basis.     I discussed the assessment and treatment plan with the patient. The patient was provided an opportunity to ask questions and all were answered. The patient agreed with the plan and demonstrated an understanding of the instructions. The patient was advised to  call back or seek an in-person evaluation if the symptoms worsen or if the condition fails to improve as anticipated.   I provided 15 minutes of face-to-face Web Ex time during this encounter.    Rulon Eisenmenger, MD 06/09/2018   I, Tina [name], am acting as scribe for Nicholas Lose, MD.  I have reviewed the above documentation for accuracy and completeness, and I agree with the above.

## 2018-06-09 ENCOUNTER — Inpatient Hospital Stay (HOSPITAL_BASED_OUTPATIENT_CLINIC_OR_DEPARTMENT_OTHER): Payer: Federal, State, Local not specified - PPO | Admitting: Hematology and Oncology

## 2018-06-09 ENCOUNTER — Encounter (INDEPENDENT_AMBULATORY_CARE_PROVIDER_SITE_OTHER): Payer: Self-pay | Admitting: Family Medicine

## 2018-06-09 DIAGNOSIS — I69351 Hemiplegia and hemiparesis following cerebral infarction affecting right dominant side: Secondary | ICD-10-CM

## 2018-06-09 LAB — PROTHROMBIN GENE MUTATION

## 2018-06-09 LAB — FACTOR 5 LEIDEN

## 2018-06-09 NOTE — Progress Notes (Signed)
Prescreened pt for tomorrow's appt

## 2018-06-09 NOTE — Progress Notes (Signed)
Office: [phone]  /  Fax: [phone] TeleHealth Visit:  Tina [name] has verbally consented to this TeleHealth visit today. The patient is located at home, the provider is located at the [employer] [facility] office. The participants in this visit include the listed provider and patient. The visit was conducted today via Webex.  HPI:   Chief Complaint: OBESITY Tina [name] is here to discuss her progress with her obesity treatment plan. She is on the Category 3 plan and is following her eating plan approximately 70% of the time. She states she is exercising 0 minutes 0 times per week. Tina [name] states she has gained weight and her weight today is 232 lbs. She reports being depressed the last few weeks. Tina [name] reports having quite a bit of chronic pain.  She is fatigued and lacks energy. She reports skipping meals quite often - especially lunch and breakfast. We were unable to weigh the patient today for this TeleHealth visit. She states her weight today is 232 lbs. She has lost 5 lbs since starting treatment with [name].  Depression with emotional eating behaviors Tina [name] is struggling with emotional eating and using food for comfort to the extent that it is negatively impacting her health. She often snacks when she is not hungry rather than eating a meal. . Tina [name] is seeing Dr. [name]. She reports having sweet cravings every few days and can often not give in. She is prescribed Citalopram but was unsure how to take it and thought she should take only when anxious. She shows no sign of suicidal or homicidal ideations.  Depression screen [facility] 2/9 05/18/2018 05/13/2018 01/28/2018 01/01/2018 11/03/2017  Decreased Interest 3 1 1 1 2   Down, Depressed, Hopeless 2 0 0 1 3  PHQ - 2 Score 5 1 1 2 5   Altered sleeping 3 - - - 2  Tired, decreased energy 3 - - - 3  Change in appetite 3 - - - 3  Feeling bad or failure about yourself  3 - - - 3  Trouble concentrating 2 - - - 1  Moving slowly or  fidgety/restless 0 - - - 0  Suicidal thoughts 0 - - - 1  PHQ-9 Score 19 - - - 18  Difficult doing work/chores - - - - Somewhat difficult  Some recent data might be hidden   ASSESSMENT AND PLAN:  Other depression  Class 2 severe obesity with serious comorbidity and body mass index (BMI) of 39.0 to 39.9 in adult, unspecified obesity type (HCC)  PLAN:  Depression with Emotional Eating Behaviors We discussed behavior modification techniques today to help Tina [name] deal with her emotional eating and depression. We discussed the proper way to take Citalopram (daily) and she plans to start taking daily.  I spent > than 50% of the 15 minute visit on counseling as documented in the note.  Obesity Tina [name] is currently in the action stage of change. As such, her goal is to continue with weight loss efforts. She has agreed to follow the Category 3 plan. Tina [name] has been instructed to work up to a goal of 150 minutes of combined cardio and strengthening exercise per week for weight loss and overall health benefits. We discussed the following Behavioral Modification Strategies today: increasing lean protein intake, decreasing simple carbohydrates, and no skipping meals. We discussed strategies for reducing meal skipping.   Tina [name] has agreed to follow-up with our clinic in 2-3 weeks. She was informed of the importance of frequent follow-up visits to maximize her success  with intensive lifestyle modifications for her multiple health conditions.  ALLERGIES: Allergies  Allergen Reactions  . Contrast Media [Iodinated Diagnostic Agents] Other (See Comments)    Difficulty breathing  . Iohexol Hives, Nausea And Vomiting and Swelling     Desc: Magnevist-gadolinium-difficulty breathing, throat swelling   . Midazolam Hcl Anaphylaxis    Difficulty breathing  . Shellfish Allergy Anaphylaxis  . Valsartan Swelling  . Metformin And Related Diarrhea and Nausea And Vomiting  . Other Itching    Patient is  allergic to all nuts except peanuts.   . Avandia [Rosiglitazone Maleate] Hives and Other (See Comments)  . Geodon [Ziprasidone] Other (See Comments)    UNKNOWN  . Kiwi Extract Itching and Swelling  . Latex Itching    MEDICATIONS: Current Outpatient Medications on File Prior to Visit  Medication Sig Dispense Refill  . allopurinol (ZYLOPRIM) 100 MG tablet Take 100 mg by mouth every evening.    Marland Kitchen amLODipine (NORVASC) 10 MG tablet TAKE 1 TABLET BY MOUTH EVERY DAY 90 tablet 1  . aspirin 325 MG tablet Take 325 mg by mouth at bedtime.     Marland Kitchen atorvastatin (LIPITOR) 10 MG tablet TAKE 1 TABLET BY MOUTH EVERY DAY 30 tablet 0  . cetirizine (ZYRTEC) 10 MG tablet Take 10 mg by mouth daily as needed for allergies.   11  . cloNIDine (CATAPRES) 0.1 MG tablet Take 0.1 mg by mouth 2 (two) times daily.  0  . colchicine 0.6 MG tablet Take 0.6 mg by mouth daily as needed (For gout flare-up.).   1  . EPINEPHrine 0.3 mg/0.3 mL IJ SOAJ injection INJECT AS DIRECTED AS NEEDED FOR ALLERGIC REACTION 1 Device 2  . fluticasone (FLONASE) 50 MCG/ACT nasal spray daily as needed.     . gabapentin (NEURONTIN) 600 MG tablet Take 600 mg by mouth 3 (three) times daily.     . hydrochlorothiazide (HYDRODIURIL) 50 MG tablet Take 50 mg by mouth daily.     . insulin aspart (NOVOLOG) 100 UNIT/ML injection Inject 5-15 Units into the skin 3 (three) times daily with meals. Per sliding scale--pt uses Omnipod and gets a basal rate     . Insulin Glargine (BASAGLAR KWIKPEN) 100 UNIT/ML SOPN Inject 20 Units into the skin daily.     . Liraglutide (VICTOZA) 18 MG/3ML SOPN Inject 1.2 mg into the skin daily.    Marland Kitchen omeprazole (PRILOSEC) 40 MG capsule TAKE 1 CAPSULE BY MOUTH EVERY DAY 90 capsule 1  . ondansetron (ZOFRAN) 4 MG tablet Take 1 tablet (4 mg total) by mouth every 8 (eight) hours as needed for up to 20 doses for nausea or vomiting. 20 tablet 0  . propranolol (INDERAL) 20 MG tablet Take 20 mg by mouth daily.    Marland Kitchen spironolactone (ALDACTONE)  50 MG tablet Take 1 tablet (50 mg total) by mouth daily. 90 tablet 3  . tiZANidine (ZANAFLEX) 2 MG tablet TAKE 1 TABLET AT BEDTIME DAILY. CAN TAKE 1/2 TABLET IN THE AM AS NEEDED (Patient taking differently: Take 1-2 mg by mouth See admin instructions. Take 2mg  at bedtime daily. Can take 1mg  in the morning as needed for muscle spasms.) 135 tablet 1  . traMADol (ULTRAM) 50 MG tablet TAKE 1 TABLET BY MOUTH 3 TIMES A DAY AS NEEDED (Patient taking differently: Take 50 mg by mouth 3 (three) times daily. BID (may take TID as needed)) 90 tablet 3  . Vitamin D, Ergocalciferol, (DRISDOL) 1.25 MG (50000 UT) CAPS capsule Take 1 capsule (50,000 Units total) by  mouth every 7 (seven) days. 4 capsule 0   No current facility-administered medications on file prior to visit.     PAST MEDICAL HISTORY: Past Medical History:  Diagnosis Date  . Allergy   . Anemia    DURING MENSES--HAS HEAVY BLEEDING WITH PERODS  . Anxiety   . Back pain, chronic    "ongoing"  . Blood transfusion    IN 2012  AFTER C -SECTION  . Cerebral thrombosis with cerebral infarction [facility]) JUNE 2011   RIGHT SIDED WEAKNESS ( ARM AND LEG ) AND SPASMS-remains with slight weakness and vertigo.  . Constipation   . Depression   . Diabetes mellitus   . Diabetic neuropathy ([place])    BOTH FEET --COMES AND GOES  . Edema, lower extremity   . Endometriosis   . Fatty liver   . GERD (gastroesophageal reflux disease)    with pregnancy  . H/O eye surgery   . Headache(784.0)    MIGRAINES--NOT REALLY HEADACHE-MORE LIKE PRESSURE SENSATION IN HEAD-FEELS DIZZIY AND  FAINT AS THE PRESSURE RESOLVES  . Hernia, incisional, RLQ, s/p lap repair Sep 2013 09/02/2011  . History of vertigo 03/22/2018  . Hx of migraines 10/19/2011  . Hypertension   . Leg pain, right    "like bad Charley horse"  . Multiple food allergies   . Panic disorder without agoraphobia   . Rash    HANDS, ARMS --STATES HX OF RASH EVER SINCE CHILDBIRTH/PREGNANCY.  STATES THE RASH OFTEN OCCURS  WHEN SHE IS REALLY STRESSED."goes and comes-presently left ring finger"  . Restless leg syndrome    DIAGNOSED BY SLEEP STUDY - PT TOLD SHE DID NOT HAVE SLEEP APNEA  . Right rotator cuff tear    PAIN IN RIGHT SHOULDER  . SBO (small bowel obstruction) ([place]) 06/03/2012  . Shortness of breath   . Spastic hemiplegia affecting dominant side ([place])   . Stomach problems   . Stroke ([place])   . Ventral hernia    RIGHT LOWER QUADRANT-CAUSING SOME PAIN  . Weakness of right side of body     PAST SURGICAL HISTORY: Past Surgical History:  Procedure Laterality Date  . APPLICATION OF WOUND VAC N/A 05/25/2015   Procedure: APPLICATION OF WOUND VAC;  Surgeon: Michael Boston, MD;  Location: [facility];  Service: General;  Laterality: N/A;  . CESAREAN SECTION  2012  . COLONOSCOPY    . DIAGNOSTIC LAPAROSCOPY    . ESOPHAGOGASTRODUODENOSCOPY N/A 06/03/2012   Procedure: ESOPHAGOGASTRODUODENOSCOPY (EGD);  Surgeon: [name] Craver, MD;  Location: [facility] ENDOSCOPY;  Service: Endoscopy;  Laterality: N/A;  . EXCISION MASS ABDOMINAL N/A 05/25/2015   Procedure: ABDOMINAL WALL EXPLORATION EXCISION OF SEROMA REMOVAL OF REDUNDANT SKIN ;  Surgeon: Michael Boston, MD;  Location: [facility];  Service: General;  Laterality: N/A;  . EYE SURGERY     Eye laser for vessel hemorrhaging  . fybroid removal    . HERNIA REPAIR  10/03/11   ventral hernia repair  . INSERTION OF MESH N/A 02/04/2013   Procedure: INSERTION OF MESH;  Surgeon: Adin Hector, MD;  Location: [facility];  Service: General;  Laterality: N/A;  . UMBILICAL HERNIA REPAIR N/A 02/04/2013   Procedure: LAPAROSCOPIC ventral wall hernia repair LAPAROSCOPIC LYSIS OF ADHESIONS laparoscopic exploration of abdomen ;  Surgeon: Adin Hector, MD;  Location: [facility];  Service: General;  Laterality: N/A;  . UPPER GASTROINTESTINAL ENDOSCOPY    . URETER REVISION     Bilateral "twisted"  . UTERINE FIBROID SURGERY     2  SURGERIES FOR FIBROIDS  . VENTRAL HERNIA REPAIR  10/03/2011   Procedure:  LAPAROSCOPIC VENTRAL HERNIA;  Surgeon: Adin Hector, MD;  Location: [facility];  Service: General;  Laterality: N/A;    SOCIAL HISTORY: Social History   Tobacco Use  . Smoking status: Never Smoker  . Smokeless tobacco: Never Used  Substance Use Topics  . Alcohol use: No    Alcohol/week: 0.0 standard drinks  . Drug use: No    FAMILY HISTORY: Family History  Problem Relation Age of Onset  . Diabetes Father   . Kidney disease Father   . Depression Father   . Drug abuse Father   . Allergic rhinitis Mother   . Eczema Mother   . Urticaria Mother   . Depression Mother   . Anxiety disorder Mother   . Bipolar disorder Mother   . Alcoholism Mother   . Drug abuse Mother   . Eating disorder Mother   . Diabetes Maternal Grandmother   . Hyperlipidemia Paternal Grandmother   . Stroke Paternal Grandmother   . Eczema Sister   . Urticaria Sister   . Colon cancer Paternal Uncle   . Other Neg Hx   . Angioedema Neg Hx   . Asthma Neg Hx   . Colon polyps Neg Hx   . Esophageal cancer Neg Hx   . Rectal cancer Neg Hx   . Stomach cancer Neg Hx    ROS: Review of Systems  Psychiatric/Behavioral: Positive for depression (emotional eating). Negative for suicidal ideas.       Negative for homicidal ideas.   PHYSICAL EXAM: Pt in no acute distress  RECENT LABS AND TESTS: BMET    Component Value Date/Time   NA 137 04/20/2018 1537   NA 141 11/03/2017 1310   K 3.6 04/20/2018 1537   CL 99 04/20/2018 1537   CO2 25 04/20/2018 1537   GLUCOSE 240 (H) 04/20/2018 1537   BUN 22 (H) 04/20/2018 1537   BUN 11 11/03/2017 1310   CREATININE 1.11 (H) 04/20/2018 1537   CALCIUM 10.1 04/20/2018 1537   GFRNONAA 59 (L) 04/20/2018 1537   GFRAA >60 04/20/2018 1537   Lab Results  Component Value Date   HGBA1C 8.5 (H) 02/15/2018   HGBA1C 8.4 (H) 11/03/2017   HGBA1C 7.5 (H) 05/18/2015   HGBA1C 8.7 (H) 02/05/2013   HGBA1C 6.9 (H) 06/03/2012   No results found for: INSULIN CBC    Component Value  Date/Time   WBC 13.1 (H) 04/20/2018 1537   RBC 5.26 (H) 04/20/2018 1537   HGB 13.5 04/20/2018 1537   HCT 42.4 04/20/2018 1537   PLT 348 04/20/2018 1537   MCV 80.6 04/20/2018 1537   MCH 25.7 (L) 04/20/2018 1537   MCHC 31.8 04/20/2018 1537   RDW 14.6 04/20/2018 1537   LYMPHSABS 1.3 05/11/2017 1323   MONOABS 0.6 05/11/2017 1323   EOSABS 0.2 05/11/2017 1323   BASOSABS 0.0 05/11/2017 1323   Iron/TIBC/Ferritin/ %Sat    Component Value Date/Time   IRON 114 11/01/2015 1031   TIBC 372 11/01/2015 1031   FERRITIN 35 11/01/2015 1031   IRONPCTSAT 31 11/01/2015 1031   Lipid Panel     Component Value Date/Time   CHOL 169 02/15/2018 1034   TRIG 168 (H) 02/15/2018 1034   HDL 41 02/15/2018 1034   CHOLHDL 6.0 10/17/2011 0315   VLDL 59 (H) 10/17/2011 0315   LDLCALC 94 02/15/2018 1034   Hepatic Function Panel     Component Value Date/Time   PROT 8.0  04/20/2018 1537   PROT 6.8 11/03/2017 1310   ALBUMIN 3.7 04/20/2018 1537   ALBUMIN 3.8 11/03/2017 1310   AST 18 04/20/2018 1537   ALT 14 04/20/2018 1537   ALKPHOS 60 04/20/2018 1537   BILITOT 0.8 04/20/2018 1537   BILITOT 0.3 11/03/2017 1310   BILIDIR <0.1 06/03/2012 0220   IBILI NOT CALCULATED 06/03/2012 0220      Component Value Date/Time   TSH 0.947 11/03/2017 1310   TSH 2.020 09/08/2017 1129   TSH 1.045 06/03/2012 0520   Results for LANESHA, AZZARO (MRN [id number]) as of 06/09/2018 12:12  Ref. Range 02/15/2018 10:34  Vitamin D, 25-Hydroxy Latest Ref Range: 30.0 - 100.0 ng/mL 26.5 (L)    I, Michaelene Song, am acting as Location manager for [employer], FNP-C.  I have reviewed the above documentation for accuracy and completeness, and I agree with the above.  - Dashawna Delbridge, FNP-C.

## 2018-06-09 NOTE — Assessment & Plan Note (Signed)
History of CVA at the age of 84 and 2012 History of ischemic colitis with GI bleeding 2005 Patient moved from [place] to [place]  Hypercoagulability work-up was negative for 1. Factor V Leiden mutation 2. Prothrombin gene [id number] 3. Protein S deficiency  4. Protein C deficiency  5. Antithrombin deficiency 6.  Lupus anticoagulant/APL antibody syndrome  I instructed the patient that she needs to exercise and stay active to prevent risk of future blood clots. Return to clinic on an as-needed basis.

## 2018-06-10 ENCOUNTER — Ambulatory Visit (INDEPENDENT_AMBULATORY_CARE_PROVIDER_SITE_OTHER): Payer: Federal, State, Local not specified - PPO | Admitting: Gastroenterology

## 2018-06-10 ENCOUNTER — Encounter: Payer: Self-pay | Admitting: Gastroenterology

## 2018-06-10 ENCOUNTER — Other Ambulatory Visit: Payer: Self-pay

## 2018-06-10 VITALS — Ht 65.0 in | Wt 233.0 lb

## 2018-06-10 DIAGNOSIS — K5900 Constipation, unspecified: Secondary | ICD-10-CM | POA: Diagnosis not present

## 2018-06-10 DIAGNOSIS — K529 Noninfective gastroenteritis and colitis, unspecified: Secondary | ICD-10-CM

## 2018-06-10 DIAGNOSIS — R110 Nausea: Secondary | ICD-10-CM

## 2018-06-10 MED ORDER — ONDANSETRON 4 MG PO TBDP: 4.0000 mg | ORAL_TABLET | Freq: Three times a day (TID) | ORAL | 1 refills | Status: DC | PRN

## 2018-06-10 MED ORDER — SUPREP BOWEL PREP KIT 17.5-3.13-1.6 GM/177ML PO SOLN: ORAL | 0 refills | Status: DC

## 2018-06-10 NOTE — Patient Instructions (Signed)
If you are age 49 or older, your body mass index should be between 23-30. Your Body mass index is 38.77 kg/m. If this is out of the aforementioned range listed, please consider follow up with your Primary Care Provider.  If you are age 56 or younger, your body mass index should be between 19-25. Your Body mass index is 38.77 kg/m. If this is out of the aformentioned range listed, please consider follow up with your Primary Care Provider.   To help prevent the possible spread of infection to our patients, communities, and staff; we will be implementing the following measures:  As of now we are not allowing any visitors/family members to accompany you to any upcoming appointments with [facility]. If you have any concerns about this please contact our office to discuss prior to the appointment.   You have been scheduled for a colonoscopy. Please follow written instructions sent to MyChart  today.  Please pick up your prep supplies at the pharmacy within the next 1-3 days. If you use inhalers (even only as needed), please bring them with you on the day of your procedure. Your physician has requested that you go to www.startemmi.com and enter the access code given to you at your visit today. This web site gives a general overview about your procedure. However, you should still follow specific instructions given to you by our office regarding your preparation for the procedure.  We have sent the following medications to your pharmacy for you to pick up at your convenience: Zofran 4mg  Orally dissolving tablets: Dissolve under tongue every 6 to 8 hours as needed  Thank you for entrusting me with your care and for choosing [employer], Dr. Ponce Cellar

## 2018-06-10 NOTE — Progress Notes (Signed)
Office: [phone]  /  Fax: [phone]    Date: June 14, 2018 Appointment Start Time: 8:35am Duration: 27 minutes Provider: Glennie [name], Psy.D. Type of Session: Individual Therapy  Location of Patient: Home Location of Provider: Provider's Home Type of Contact: Telepsychological Visit via [employer] WebEx   Session Content: Tina [name] is a 48 y.o. female presenting via [place] for a follow-up appointment to address the previously established treatment goal of decreasing emotional eating. Of note, this provider called Tina [name] at 8:32am as she did not present for today's Webex appointment. The e-mail was re-sent with the secure link was resent. Thus, the appointment was initiated 5 minutes late. Today's appointment was a telepsychological visit, as this provider's clinic is seeing a limited number of patients for in-person visits due to COVID-19. Therapeutic services will resume to in-person appointments once deemed appropriate. Tina [name] expressed understanding regarding the rationale for telepsychological services, and provided verbal consent for today's appointment. Prior to proceeding with today's appointment, Tina [name]'s physical location at the time of this appointment was obtained. Tina [name] reported she was at home and provided the address. In the event of technical difficulties, Tina [name] shared a phone number she could be reached at. Tina [name] and this provider participated in today's telepsychological service. Also, Tina [name] denied anyone else being present in the room or on the WebEx appointment. Notably, her children occasionally came in to the room she was in, but Tina [name] appropriately stopped the appointment with this provider to re-direct them and then resumed the appointment.   This provider conducted a brief check-in and verbally administered the PHQ-9 and GAD-7. Tina [name] shared she learned her sister lost her job resulting in worry about possible symptoms of depression. She indicated a plan to  go visit her along with her other sister. Additionally, Tina [name] reported an increase in blood sugar levels and pain; however, she discussed she continues to follow-up with her providers as appropriate. Moreover, she discussed she continues to engage in projects in the home, which has assisted with reducing emotional eating. She shared she was recently prescribed an anti-nausea, which has helped with her eating breakfast. Tina [name] acknowledged having cheesecake on one occasion resulting in all or nothing thinking previously discussed, which was reflected to her. Nevertheless, she reported she lost five pounds. This provider checked-in regarding the goal previously set for lunch. She noted, "I actually sit down and make my meal." She noted she is eating lunch congruent to the structured meal plan 90% of the time. However, she discussed two days where she did not eat all day until dinner. Thus, this was explored. This provider also discussed the importance of eating regularly and explored what can help increase the likelihood of eating frequently/regularly. Tina [name] indicated quick meals, such as yogurt and fruit increase the likelihood of her eating in the morning. Also, she indicated there will more time in the morning since school is ending. She further identified a plan of setting alarms before starting a project to serve as reminders to eat. Additionally, this provider recommended she take snacks with her when working outside along with water. Tina [name] agreed. Furthermore, obstacles and barriers to the aforementioned plan were explored. She noted a barrier may be her children eating her snacks. Thus, it was recommended she explore the possibility of buying extra snacks. Tina [name] agreed. Tina [name] reported she did not review the triggers handout. As such, it was recommended she review it between now and the next appointment. Notably, Tina [name] reported she craves something with sugar at night when she is up  late. She  described it being a "craving." It was recommended she utilize the handout distinguishing physical and emotional hunger previously shared to observe that craving in particular as it may be due to physical hunger and her choices may be out of habit. Furthermore, Tina [name] reported she met with her psychiatrist last Thursday for medication management and a check-in and her next appointment is next month. Of note, she reported a reduction in panic attacks since she started taking her prescribed medications again. This provider recommended longer-term therapeutic services due to ongoing stressors. Tina [name] was receptive and shared, "I know a place." She noted a plan to reach out to schedule an appointment with Tina [name]. Overall, Tina [name] was receptive to today's session as evidenced by openness to sharing, responsiveness to feedback, and willingness to implement discussed strategies.  Mental Status Examination:  Appearance: neat Behavior: cooperative Mood: euthymic Affect: mood congruent Speech: normal in rate, volume, and tone Eye Contact: appropriate Psychomotor Activity: appropriate Thought Process: linear, logical, and goal directed  Content/Perceptual Disturbances: denies suicidal and homicidal ideation, plan, and intent and no hallucinations, delusions, bizarre thinking or behavior reported or observed Orientation: time, person, place and purpose of appointment Cognition/Sensorium: memory, attention, language, and fund of knowledge intact  Insight: fair Judgment: fair  Structured Assessment Results: The Patient Health Questionnaire-9 (PHQ-9) is a self-report measure that assesses symptoms and severity of depression over the course of the last two weeks. Tina [name] obtained a score of 8 suggesting mild depression. Tina [name] finds the endorsed symptoms to be somewhat difficult. Decreased interest 1  Down, depressed, hopeless 1  Altered sleeping 1  Tired, decreased energy 1  Change  in appetite 1  Feeling bad or failure about yourself 2  Trouble concentrating 1  Moving slowly or fidgety/restless 0  Suicidal thoughts 0  PHQ-9 Score 8    The Generalized Anxiety Disorder-7 (GAD-7) is a brief self-report measure that assesses symptoms of anxiety over the course of the last two weeks. Tina [name] obtained a score of 10 suggesting moderate anxiety. Tina [name] finds the endorsed symptoms to be somewhat difficult. Nervous, anxious, on edge 1  Control/stop worrying 1  Worrying too much- different things 2  Trouble relaxing 1  Restless 1  Easily annoyed or irritable 1  Afraid-awful might happen 3  GAD-7 Score 10   Interventions:  Conducted a brief chart review Verbal administration of PHQ-9 and GAD-7 for symptom monitoring Provided empathic reflections and validation Reviewed content from the previous session Engaged patient in problem solving Employed motivational interviewing skills to assess patient's willingness/desire to adhere to recommended medical treatments and assignments Discussed option for a referral for longer-term therapeutic services Provided positive reinforcement Employed supportive psychotherapy interventions to facilitate reduced distress, and to improve coping skills with identified stressors  DSM-5 Diagnosis: 296.32 (F33.1) Major Depressive Disorder, Recurrent Episode, Moderate, With Anxious Distress  Treatment Goal & Progress: During the initial appointment with this provider, the following treatment goal was established: decrease emotional eating. Tina [name] has demonstrated progress in her goal as evidenced by increased awareness of hunger patterns and willingness to continue exploring her triggers for emotional eating. She also reported follow through with established goals and demonstrates willingness to implement discussed strategies.   Plan: Tina [name] continues to appear able and willing to participate as evidenced by engagement in reciprocal conversation,  and asking questions for clarification as appropriate. The next appointment will be scheduled in two weeks, which will be via [employer]. Once this provider's office resumes in-person appointments and it is  deemed appropriate, Tina [name] will be notified. The next session will focus on discussing implementation of discussed strategies, and reviewing triggers for emotional eating as they were not reviewed today due to Tina [name]'s presenting concerns.

## 2018-06-10 NOTE — Progress Notes (Signed)
Virtual Visit via Video Note  I connected with Tina [name] on 06/10/18 at  2:30 PM EDT by a video enabled telemedicine application and verified that I am speaking with the correct person using two identifiers.  I discussed the limitations of evaluation and management by telemedicine and the availability of in person appointments. The patient expressed understanding and agreed to proceed.  THIS ENCOUNTER IS A VIRTUAL VISIT DUE TO COVID-19 - PATIENT WAS NOT SEEN IN THE OFFICE. PATIENT HAS CONSENTED TO VIRTUAL VISIT / TELEMEDICINE VISIT USING [employer] APP   Location of patient: home Location of provider: office Persons participating: myself, patient    HPI :  48 y/o female here for a follow up visit.  I had previously seen her in April for abdominal pain, change in bowel habits, and rectal bleeding. Symptoms started acutely and were severe. She was seen in the ED in April and had a CT scan done as outlined below. She had not been using any NSAIDs. Colonoscopy was done April 16th. She had gross changes in her colon concerning for ischemic colitis in the proximal left colon to transverse colon. Biopsies taken and showed nonspecific inflammatory changes. The prep was poor however and unable to perform a complete exam. EGD also done at that time showing some mild gastritis and benign polyps of the duodenum. Biopsies negative for H pylori.   She has had a history of a stroke in the past of unclear etiology. She was seen by  Hematology, Dr. Lindi Adie, in light of these issues and repeated a hypercoag workup which was unremarkable. She has since resumed a regular aspirin. I had recommended Miralax for her constipation. She has also been omeprazole 40mg  / day.   She has some nausea that has persisted and bothers her periodically. She is generally feeling better but still has some pain in the LUQ and RLQ at times. She remains slightly constipated but having a BM once per day. She is taking Miralax  every day and eating prunes. She has not had any blood in her stools. She is taking regular aspirin 325mg . She has been taking omeprazole 40mg  once daily which she thinks helps. She has not been using Zofran too much for the nausea. She thinks it helps when she takes.   CT scan 04/20/18 - normal bowel, post-op change with resolving hematoma? in the pelvis, enlarged uterus, fatty liver  EGD 04/29/18 - normal esophagus, mild gastritis, benign duodenal peptic duodenitis, H pylori negative   Colonoscopy 04/29/18 - - A large amount of liquid stool was found in the entire colon, making visualization difficult.- erythematous, inflamed and ulcerated mucosa was found in the descending colon, at the splenic flexure, in the mid transverse colon and in the distal transverse colon, mostly in a linear fashion. Gross appearance most concerning for ischemic colitis. Biopsies were taken with a cold forceps for histology.The exam was otherwise without abnormality. Cecal cap could not cleared due to stool. Several areas not well visualized due to stool burden, no obvious mass lesions. Path shows - nonspecific inflammation   Past Medical History:  Diagnosis Date  . Allergy   . Anemia    DURING MENSES--HAS HEAVY BLEEDING WITH PERODS  . Anxiety   . Back pain, chronic    "ongoing"  . Blood transfusion    IN 2012  AFTER C -SECTION  . Cerebral thrombosis with cerebral infarction [facility]) JUNE 2011   RIGHT SIDED WEAKNESS ( ARM AND LEG ) AND SPASMS-remains with slight weakness  and vertigo.  . Constipation   . Depression   . Diabetes mellitus   . Diabetic neuropathy ([place])    BOTH FEET --COMES AND GOES  . Edema, lower extremity   . Endometriosis   . Fatty liver   . GERD (gastroesophageal reflux disease)    with pregnancy  . H/O eye surgery   . Headache(784.0)    MIGRAINES--NOT REALLY HEADACHE-MORE LIKE PRESSURE SENSATION IN HEAD-FEELS DIZZIY AND  FAINT AS THE PRESSURE RESOLVES  . Hernia, incisional, RLQ, s/p lap  repair Sep 2013 09/02/2011  . History of vertigo 03/22/2018  . Hx of migraines 10/19/2011  . Hypertension   . Leg pain, right    "like bad Charley horse"  . Multiple food allergies   . Panic disorder without agoraphobia   . Rash    HANDS, ARMS --STATES HX OF RASH EVER SINCE CHILDBIRTH/PREGNANCY.  STATES THE RASH OFTEN OCCURS WHEN SHE IS REALLY STRESSED."goes and comes-presently left ring finger"  . Restless leg syndrome    DIAGNOSED BY SLEEP STUDY - PT TOLD SHE DID NOT HAVE SLEEP APNEA  . Right rotator cuff tear    PAIN IN RIGHT SHOULDER  . SBO (small bowel obstruction) ([place]) 06/03/2012  . Shortness of breath   . Spastic hemiplegia affecting dominant side ([place])   . Stomach problems   . Stroke ([place])   . Ventral hernia    RIGHT LOWER QUADRANT-CAUSING SOME PAIN  . Weakness of right side of body      Past Surgical History:  Procedure Laterality Date  . APPLICATION OF WOUND VAC N/A 05/25/2015   Procedure: APPLICATION OF WOUND VAC;  Surgeon: Michael Boston, MD;  Location: [facility];  Service: General;  Laterality: N/A;  . CESAREAN SECTION  2012  . COLONOSCOPY    . DIAGNOSTIC LAPAROSCOPY    . ESOPHAGOGASTRODUODENOSCOPY N/A 06/03/2012   Procedure: ESOPHAGOGASTRODUODENOSCOPY (EGD);  Surgeon: [name] Craver, MD;  Location: [facility] ENDOSCOPY;  Service: Endoscopy;  Laterality: N/A;  . EXCISION MASS ABDOMINAL N/A 05/25/2015   Procedure: ABDOMINAL WALL EXPLORATION EXCISION OF SEROMA REMOVAL OF REDUNDANT SKIN ;  Surgeon: Michael Boston, MD;  Location: [facility];  Service: General;  Laterality: N/A;  . EYE SURGERY     Eye laser for vessel hemorrhaging  . fybroid removal    . HERNIA REPAIR  10/03/11   ventral hernia repair  . INSERTION OF MESH N/A 02/04/2013   Procedure: INSERTION OF MESH;  Surgeon: Adin Hector, MD;  Location: [facility];  Service: General;  Laterality: N/A;  . UMBILICAL HERNIA REPAIR N/A 02/04/2013   Procedure: LAPAROSCOPIC ventral wall hernia repair LAPAROSCOPIC LYSIS OF ADHESIONS laparoscopic  exploration of abdomen ;  Surgeon: Adin Hector, MD;  Location: [facility];  Service: General;  Laterality: N/A;  . UPPER GASTROINTESTINAL ENDOSCOPY    . URETER REVISION     Bilateral "twisted"  . UTERINE FIBROID SURGERY     2 SURGERIES FOR FIBROIDS  . VENTRAL HERNIA REPAIR  10/03/2011   Procedure: LAPAROSCOPIC VENTRAL HERNIA;  Surgeon: Adin Hector, MD;  Location: [facility];  Service: General;  Laterality: N/A;   Family History  Problem Relation Age of Onset  . Diabetes Father   . Kidney disease Father   . Depression Father   . Drug abuse Father   . Allergic rhinitis Mother   . Eczema Mother   . Urticaria Mother   . Depression Mother   . Anxiety disorder Mother   . Bipolar disorder Mother   . Alcoholism  Mother   . Drug abuse Mother   . Eating disorder Mother   . Diabetes Maternal Grandmother   . Hyperlipidemia Paternal Grandmother   . Stroke Paternal Grandmother   . Eczema Sister   . Urticaria Sister   . Colon cancer Paternal Uncle   . Other Neg Hx   . Angioedema Neg Hx   . Asthma Neg Hx   . Colon polyps Neg Hx   . Esophageal cancer Neg Hx   . Rectal cancer Neg Hx   . Stomach cancer Neg Hx    Social History   Tobacco Use  . Smoking status: Never Smoker  . Smokeless tobacco: Never Used  Substance Use Topics  . Alcohol use: No    Alcohol/week: 0.0 standard drinks  . Drug use: No   Current Outpatient Medications  Medication Sig Dispense Refill  . allopurinol (ZYLOPRIM) 100 MG tablet Take 100 mg by mouth every evening.    Marland Kitchen amLODipine (NORVASC) 10 MG tablet TAKE 1 TABLET BY MOUTH EVERY DAY 90 tablet 1  . aspirin 325 MG tablet Take 325 mg by mouth at bedtime.     Marland Kitchen atorvastatin (LIPITOR) 10 MG tablet TAKE 1 TABLET BY MOUTH EVERY DAY 30 tablet 0  . cetirizine (ZYRTEC) 10 MG tablet Take 10 mg by mouth daily as needed for allergies.   11  . cloNIDine (CATAPRES) 0.1 MG tablet Take 0.1 mg by mouth 2 (two) times daily.  0  . colchicine 0.6 MG tablet Take 0.6 mg by mouth  daily as needed (For gout flare-up.).   1  . EPINEPHrine 0.3 mg/0.3 mL IJ SOAJ injection INJECT AS DIRECTED AS NEEDED FOR ALLERGIC REACTION 1 Device 2  . fluticasone (FLONASE) 50 MCG/ACT nasal spray daily as needed.     . gabapentin (NEURONTIN) 600 MG tablet Take 600 mg by mouth 3 (three) times daily.     . hydrochlorothiazide (HYDRODIURIL) 50 MG tablet Take 50 mg by mouth daily.     . insulin aspart (NOVOLOG) 100 UNIT/ML injection Inject 5-15 Units into the skin 3 (three) times daily with meals. Per sliding scale--pt uses Omnipod and gets a basal rate     . Insulin Glargine (BASAGLAR KWIKPEN) 100 UNIT/ML SOPN Inject 20 Units into the skin daily.     . Liraglutide (VICTOZA) 18 MG/3ML SOPN Inject 1.2 mg into the skin daily.    Marland Kitchen omeprazole (PRILOSEC) 40 MG capsule TAKE 1 CAPSULE BY MOUTH EVERY DAY 90 capsule 1  . ondansetron (ZOFRAN) 4 MG tablet Take 1 tablet (4 mg total) by mouth every 8 (eight) hours as needed for up to 20 doses for nausea or vomiting. 20 tablet 0  . propranolol (INDERAL) 20 MG tablet Take 20 mg by mouth daily.    Marland Kitchen spironolactone (ALDACTONE) 50 MG tablet Take 1 tablet (50 mg total) by mouth daily. 90 tablet 3  . tiZANidine (ZANAFLEX) 2 MG tablet TAKE 1 TABLET AT BEDTIME DAILY. CAN TAKE 1/2 TABLET IN THE AM AS NEEDED (Patient taking differently: Take 1-2 mg by mouth See admin instructions. Take 2mg  at bedtime daily. Can take 1mg  in the morning as needed for muscle spasms.) 135 tablet 1  . traMADol (ULTRAM) 50 MG tablet TAKE 1 TABLET BY MOUTH 3 TIMES A DAY AS NEEDED (Patient taking differently: Take 50 mg by mouth 3 (three) times daily. BID (may take TID as needed)) 90 tablet 3  . Vitamin D, Ergocalciferol, (DRISDOL) 1.25 MG (50000 UT) CAPS capsule Take 1 capsule (50,000 Units  total) by mouth every 7 (seven) days. 4 capsule 0   No current facility-administered medications for this visit.    Allergies  Allergen Reactions  . Contrast Media [Iodinated Diagnostic Agents] Other (See  Comments)    Difficulty breathing  . Iohexol Hives, Nausea And Vomiting and Swelling     Desc: Magnevist-gadolinium-difficulty breathing, throat swelling   . Midazolam Hcl Anaphylaxis    Difficulty breathing  . Shellfish Allergy Anaphylaxis  . Valsartan Swelling  . Metformin And Related Diarrhea and Nausea And Vomiting  . Other Itching    Patient is allergic to all nuts except peanuts.   . Avandia [Rosiglitazone Maleate] Hives and Other (See Comments)  . Geodon [Ziprasidone] Other (See Comments)    UNKNOWN  . Kiwi Extract Itching and Swelling  . Latex Itching     Review of Systems: All systems reviewed and negative except where noted in HPI.   Lab Results  Component Value Date   WBC 13.1 (H) 04/20/2018   HGB 13.5 04/20/2018   HCT 42.4 04/20/2018   MCV 80.6 04/20/2018   PLT 348 04/20/2018    Lab Results  Component Value Date   CREATININE 1.11 (H) 04/20/2018   BUN 22 (H) 04/20/2018   NA 137 04/20/2018   K 3.6 04/20/2018   CL 99 04/20/2018   CO2 25 04/20/2018    Lab Results  Component Value Date   ALT 14 04/20/2018   AST 18 04/20/2018   ALKPHOS 60 04/20/2018   BILITOT 0.8 04/20/2018     Physical Exam: Ht 5\' 5"  (1.651 m)   Wt 233 lb (105.7 kg)   BMI 38.77 kg/m     ASSESSMENT AND PLAN: 48 y/o female here for reassessment of the following issues:  Colitis / constipation / nausea - history of acute onset pain in the setting of constipation and fair volume of rectal bleeding leading to ED visit. Hgb was okay at the time and CT scan did not show any bowel pathology, but ? resolving hematoma but her surgery was years ago, and this has been seen on prior scans in 2018, not sure what to make of that but it appears benign. Colonoscopy done in April showed ulceration and inflammation of the transverse and proximal left colon, grossly consistent with ischemic colitis. Her pathology however showed only nonspecific inflammation. Exam was limited by bowel prep which was  inadequate for screening purposes. She has since resumed an aspirin 325mg  / day. She has a history of a CVA, was seen by Hematology who did a hypercoag workup and negative. Unclear what precipitated this. While she had no diarrhea, her pain and bleeding were concerning for ischemic colitis and that remains possible. If she had ischemic colitis, unclear of what precipitated it, perhaps her diuretic use and constipation put her at risk for it. She continues to have some abdominal pain but no further bleeding. Constipation now managed with Miralax which she should continue. I recommend a repeat colonoscopy next month to reassess her colon, ensure no persistent inflammatory changes, ensure no evidence of IBD / Crohn's, and to complete her colon cancer screening given limited bowel prep on the last exam. Following discussion of risks / benefits she wished to proceed. All questions answered. Refilled Zofran to use for nausea in the interim. EGD without concerning findings.   [place]ellar, MD [facility]

## 2018-06-14 ENCOUNTER — Ambulatory Visit (INDEPENDENT_AMBULATORY_CARE_PROVIDER_SITE_OTHER): Payer: HMO | Admitting: Psychology

## 2018-06-14 ENCOUNTER — Other Ambulatory Visit: Payer: Self-pay

## 2018-06-14 DIAGNOSIS — F331 Major depressive disorder, recurrent, moderate: Secondary | ICD-10-CM | POA: Diagnosis not present

## 2018-06-15 DIAGNOSIS — E113511 Type 2 diabetes mellitus with proliferative diabetic retinopathy with macular edema, right eye: Secondary | ICD-10-CM | POA: Diagnosis not present

## 2018-06-16 IMAGING — CR DG CHEST 2V
2 series · 2 of 2 positions shown · non-contrast
Comparison: 11/18/2015.

CLINICAL DATA: Cough for 5 days.  Shortness of breath.

EXAM:
CHEST - 2 VIEW

[w chest lat]
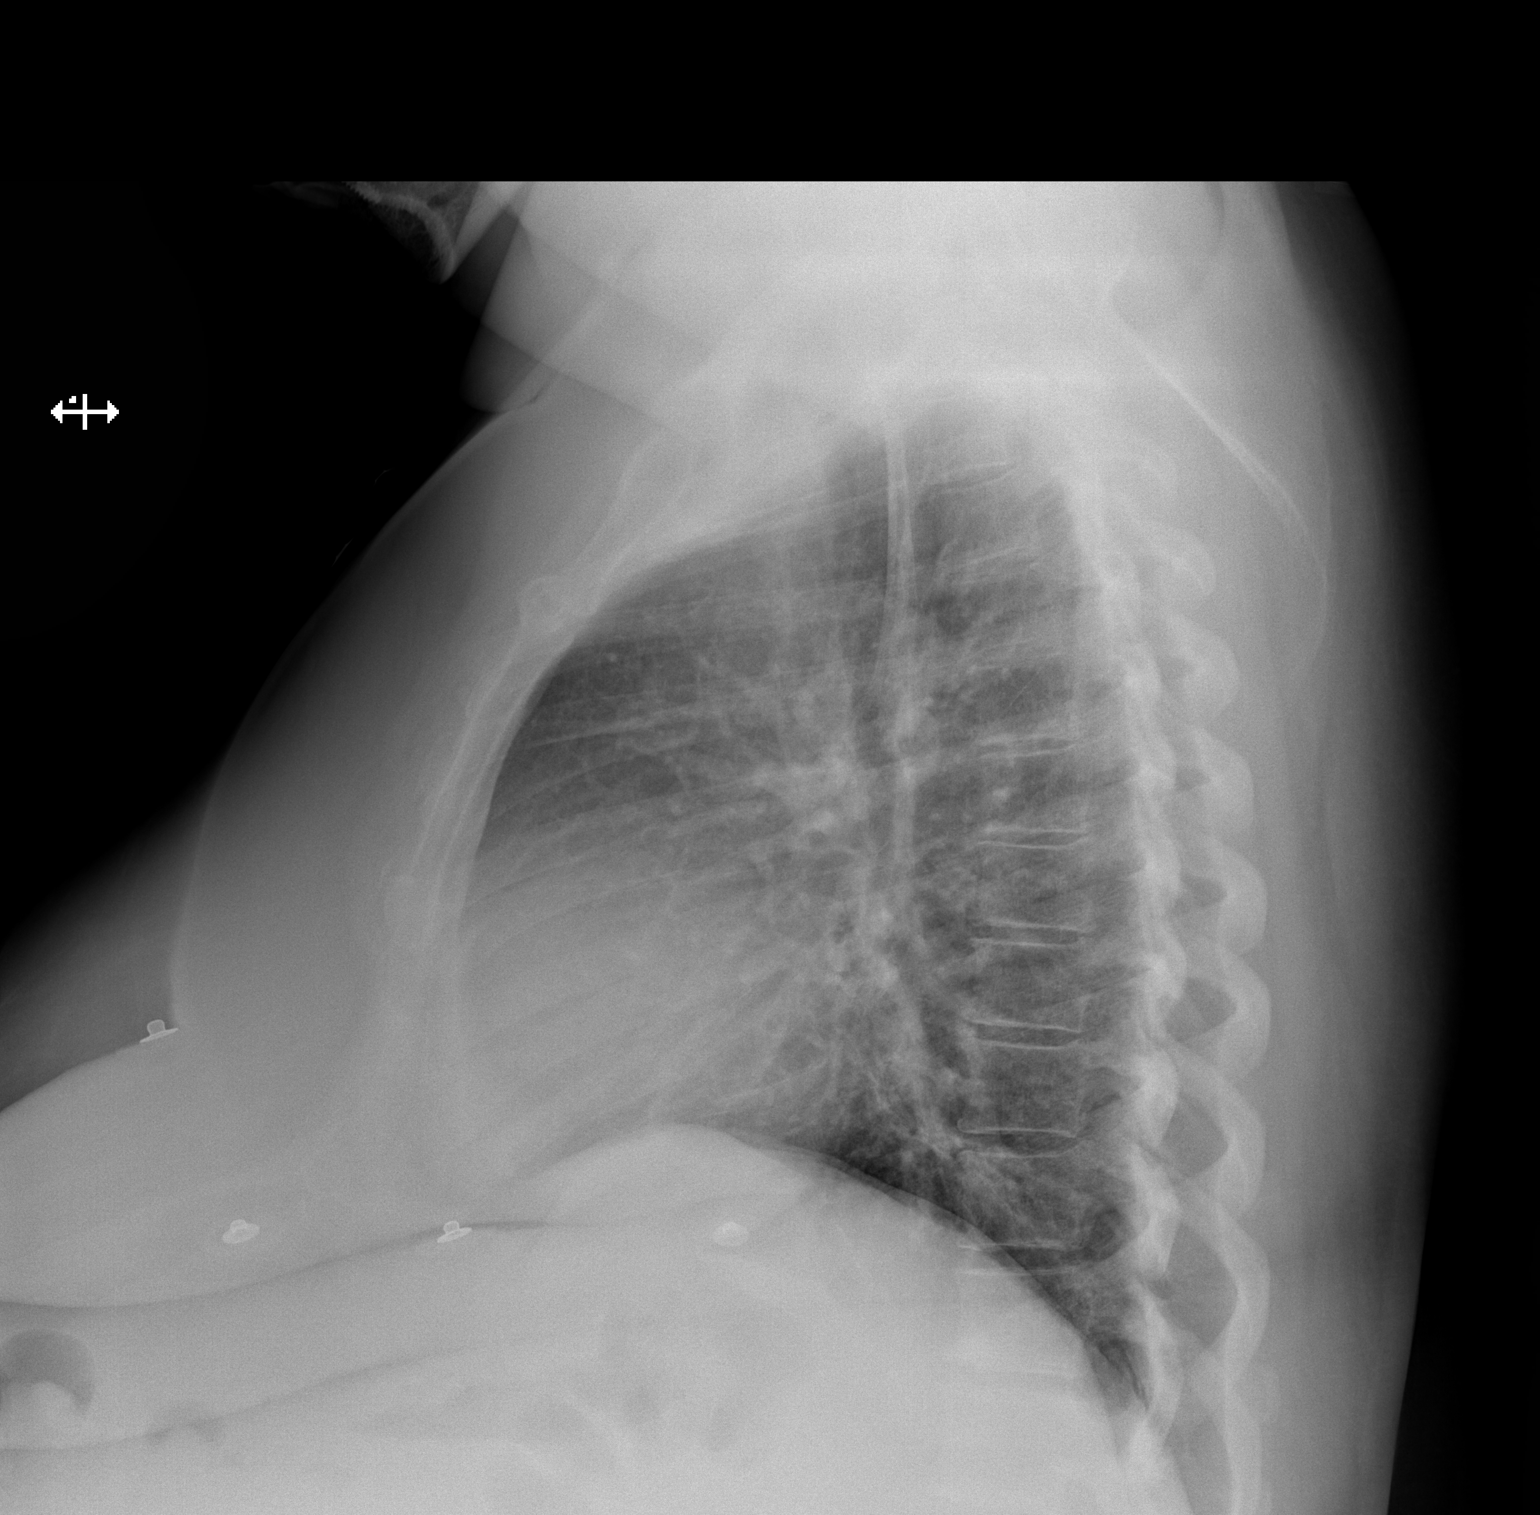

[x chest ap]
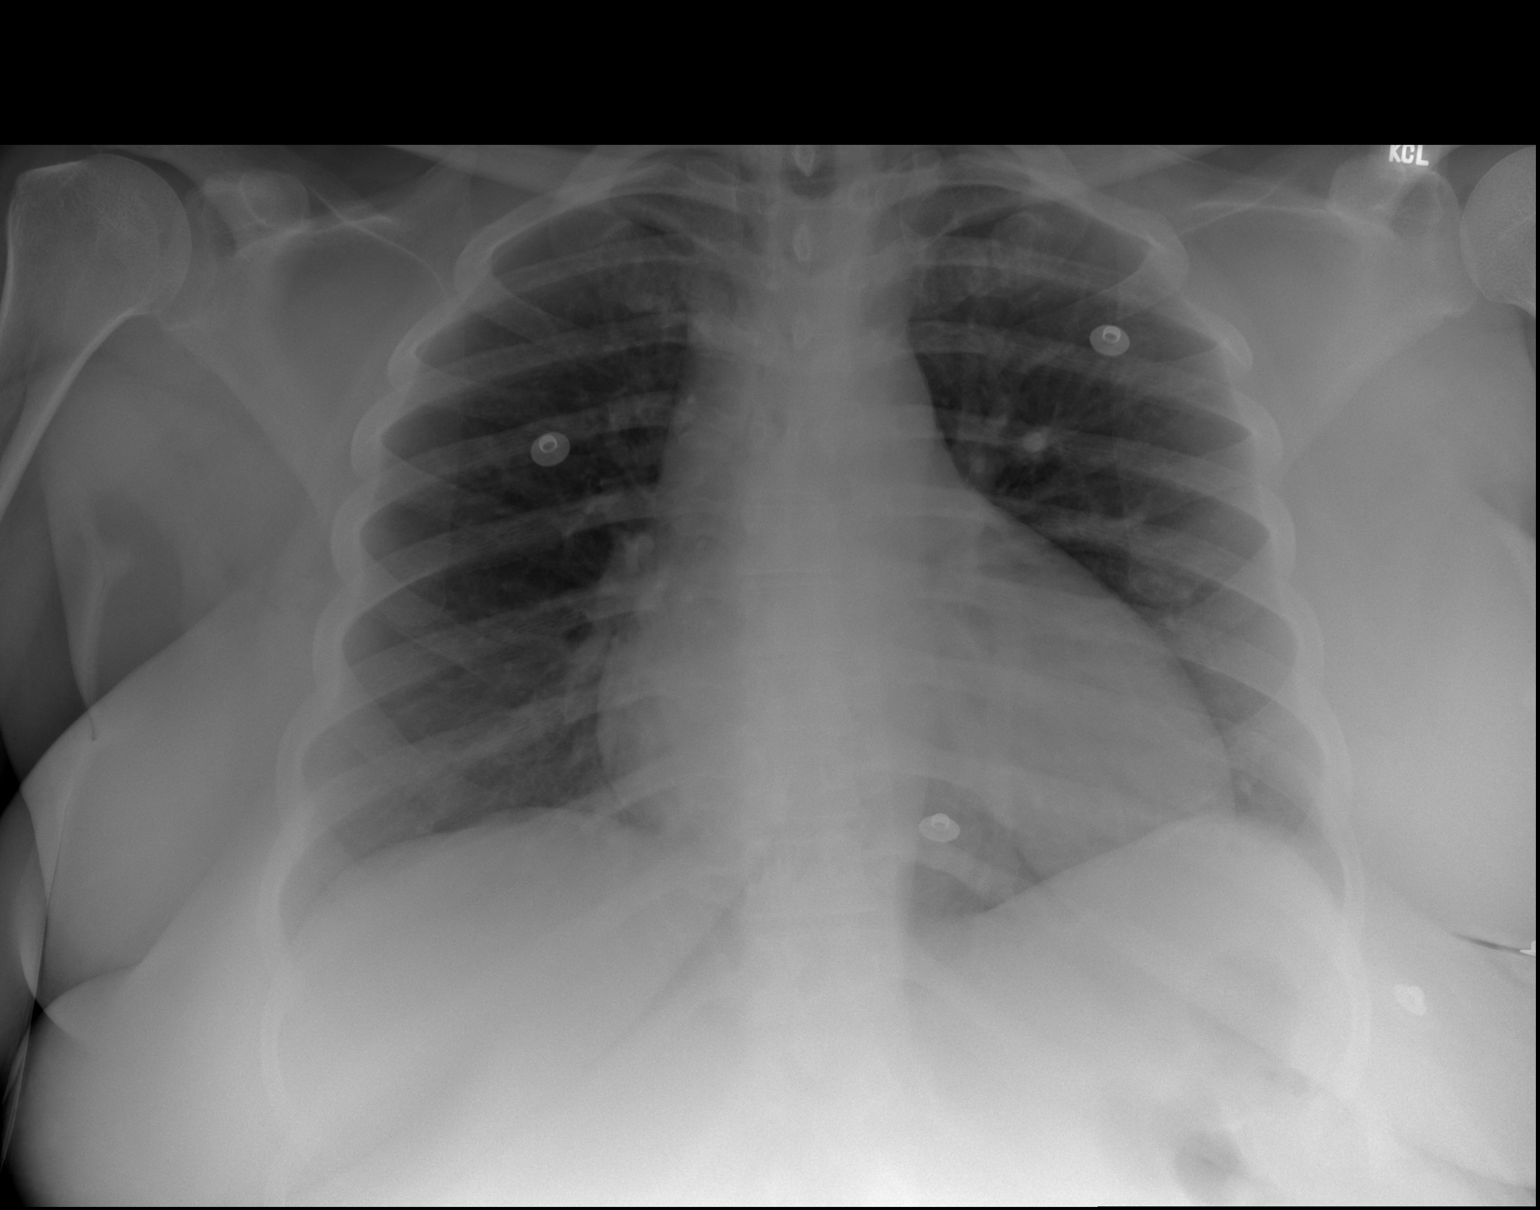

[2 of 2 positions shown; findings below may reference images not displayed]

FINDINGS: Mild cardiac enlargement appears stable allowing for AP technique.
There is mild central airway thickening, but no edema, confluent
airspace opacity, pleural effusion or pneumothorax. No acute osseous
findings are seen.
IMPRESSION: Stable chest with mild cardiomegaly and chronic central airway
thickening. No acute findings demonstrated.

## 2018-06-21 ENCOUNTER — Other Ambulatory Visit: Payer: Self-pay

## 2018-06-21 ENCOUNTER — Ambulatory Visit: Payer: HMO | Admitting: Cardiology

## 2018-06-21 ENCOUNTER — Encounter: Payer: Self-pay | Admitting: Cardiology

## 2018-06-21 ENCOUNTER — Ambulatory Visit (INDEPENDENT_AMBULATORY_CARE_PROVIDER_SITE_OTHER): Payer: HMO | Admitting: Family Medicine

## 2018-06-21 VITALS — BP 132/75 | Ht 66.5 in | Wt 230.0 lb

## 2018-06-21 DIAGNOSIS — I10 Essential (primary) hypertension: Secondary | ICD-10-CM | POA: Diagnosis not present

## 2018-06-21 DIAGNOSIS — E669 Obesity, unspecified: Secondary | ICD-10-CM | POA: Diagnosis not present

## 2018-06-21 DIAGNOSIS — I6930 Unspecified sequelae of cerebral infarction: Secondary | ICD-10-CM | POA: Diagnosis not present

## 2018-06-21 DIAGNOSIS — I5032 Chronic diastolic (congestive) heart failure: Secondary | ICD-10-CM | POA: Diagnosis not present

## 2018-06-21 DIAGNOSIS — Z20828 Contact with and (suspected) exposure to other viral communicable diseases: Secondary | ICD-10-CM | POA: Diagnosis not present

## 2018-06-21 NOTE — Progress Notes (Signed)
Primary Physician:  [name], DO   Patient ID: Tina [name], female    DOB: 04-Mar-1970, 48 y.o.   MRN: [id number]  Subjective:    Chief Complaint  Patient presents with  . Hypertension  . Leg Swelling   This visit type was conducted due to national recommendations for restrictions regarding the COVID-19 Pandemic (e.g. social distancing).  This format is felt to be most appropriate for this patient at this time.  All issues noted in this document were discussed and addressed.  No physical exam was performed (except for noted visual exam findings with Telehealth visits).  The patient has consented to conduct a Telehealth visit and understands insurance will be billed.   I discussed the limitations of evaluation and management by telemedicine and the availability of in person appointments. The patient expressed understanding and agreed to proceed.  Virtual Visit via Video Note is as below  I connected with@, on 06/21/2018 at 1500 by a video enabled telemedicine application and verified that I am speaking with the correct person using two identifiers.     I have discussed with her regarding the safety during COVID Pandemic and steps and precautions including social distancing with the patient.    HPI: Tina [name]  is a 48 y.o. female  with history of CVA with residual right-sided hemiparesis which is essentially improved and does very minimal residual defect, uncontrolled diabetes mellitus, obesity, hypertension with dyspnea on exertion and leg swelling. Had negative lexiscan nuclear stress test in August 2018 as well as Echocardiogram revealing grade 2 diastolic dysfunction with normal LVEF.  Since last seen by me she, she was seen in the ER in April for abdominal pain and acute rectal bleeding. She is on ASA due to history of CVA, but this was temporarily held in view of this. She was evaluated by Dr. Havery Moros and underwent colonoscopy was done April 16th. She had gross  changes in her colon concerning for ischemic colitis in the proximal left colon to transverse colon. Biopsies taken and showed nonspecific inflammatory changes.EGD also done at that time showing some mild gastritis and benign polyps of the duodenum. Biopsies negative for H pylori. CT scan did not show any bowel pathology, but ? resolving hematoma but her surgery was years ago, and this has been seen on prior scans in 2018. He is planning to repeat colonoscopy to ensure no persistent inflammatory changes and to reassess her colon. She feels that her GI symptoms are overall improved. No recurrent bleeding since being back on ASA.  At her last office visit with me, Aldactone was increased to 50 mg daily. She is tolerating this well and blood pressure has improved. She continues to have leg swelling and dyspnea on exertion, but slightly improved. No chest pain. She continues to work with [facility] toward weight loss, but has had difficulty with this due to being at home more. She is currently quarantined for possible COVID exposure. Reports being tested today.    She continues to follow with Dr. Neldon Mc for her multiple allergies. Found to be allergic to Victoza; however, she is currently tolerating right now.  She has not had any recent flare ups.  Reports labs with her PCP have been stable.   Past Medical History:  Diagnosis Date  . Allergy   . Anemia    DURING MENSES--HAS HEAVY BLEEDING WITH PERODS  . Anxiety   . Back pain, chronic    "ongoing"  . Blood transfusion  IN 2012  AFTER C -SECTION  . Cerebral thrombosis with cerebral infarction [facility]) JUNE 2011   RIGHT SIDED WEAKNESS ( ARM AND LEG ) AND SPASMS-remains with slight weakness and vertigo.  . Constipation   . Depression   . Diabetes mellitus   . Diabetic neuropathy ([place])    BOTH FEET --COMES AND GOES  . Edema, lower extremity   . Endometriosis   . Fatty liver   . GERD (gastroesophageal reflux disease)    with pregnancy  . H/O  eye surgery   . Headache(784.0)    MIGRAINES--NOT REALLY HEADACHE-MORE LIKE PRESSURE SENSATION IN HEAD-FEELS DIZZIY AND  FAINT AS THE PRESSURE RESOLVES  . Hernia, incisional, RLQ, s/p lap repair Sep 2013 09/02/2011  . History of vertigo 03/22/2018  . Hx of migraines 10/19/2011  . Hypertension   . Leg pain, right    "like bad Charley horse"  . Multiple food allergies   . Panic disorder without agoraphobia   . Rash    HANDS, ARMS --STATES HX OF RASH EVER SINCE CHILDBIRTH/PREGNANCY.  STATES THE RASH OFTEN OCCURS WHEN SHE IS REALLY STRESSED."goes and comes-presently left ring finger"  . Restless leg syndrome    DIAGNOSED BY SLEEP STUDY - PT TOLD SHE DID NOT HAVE SLEEP APNEA  . Right rotator cuff tear    PAIN IN RIGHT SHOULDER  . SBO (small bowel obstruction) ([place]) 06/03/2012  . Shortness of breath   . Spastic hemiplegia affecting dominant side ([place])   . Stomach problems   . Stroke ([place])   . Ventral hernia    RIGHT LOWER QUADRANT-CAUSING SOME PAIN  . Weakness of right side of body     Past Surgical History:  Procedure Laterality Date  . APPLICATION OF WOUND VAC N/A 05/25/2015   Procedure: APPLICATION OF WOUND VAC;  Surgeon: Michael Boston, MD;  Location: [facility] ORS;  Service: General;  Laterality: N/A;  . CESAREAN SECTION  2012  . COLONOSCOPY    . DIAGNOSTIC LAPAROSCOPY    . ESOPHAGOGASTRODUODENOSCOPY N/A 06/03/2012   Procedure: ESOPHAGOGASTRODUODENOSCOPY (EGD);  Surgeon: [name] Craver, MD;  Location: [facility] ENDOSCOPY;  Service: Endoscopy;  Laterality: N/A;  . EXCISION MASS ABDOMINAL N/A 05/25/2015   Procedure: ABDOMINAL WALL EXPLORATION EXCISION OF SEROMA REMOVAL OF REDUNDANT SKIN ;  Surgeon: Michael Boston, MD;  Location: [facility];  Service: General;  Laterality: N/A;  . EYE SURGERY     Eye laser for vessel hemorrhaging  . fybroid removal    . HERNIA REPAIR  10/03/11   ventral hernia repair  . INSERTION OF MESH N/A 02/04/2013   Procedure: INSERTION OF MESH;  Surgeon: Adin Hector, MD;  Location: [facility]  ORS;  Service: General;  Laterality: N/A;  . UMBILICAL HERNIA REPAIR N/A 02/04/2013   Procedure: LAPAROSCOPIC ventral wall hernia repair LAPAROSCOPIC LYSIS OF ADHESIONS laparoscopic exploration of abdomen ;  Surgeon: Adin Hector, MD;  Location: [facility];  Service: General;  Laterality: N/A;  . UPPER GASTROINTESTINAL ENDOSCOPY    . URETER REVISION     Bilateral "twisted"  . UTERINE FIBROID SURGERY     2 SURGERIES FOR FIBROIDS  . VENTRAL HERNIA REPAIR  10/03/2011   Procedure: LAPAROSCOPIC VENTRAL HERNIA;  Surgeon: Adin Hector, MD;  Location: [facility];  Service: General;  Laterality: N/A;    Social History   Socioeconomic History  . Marital status: Married    Spouse name: Annie Main  . Number of children: 3  . Years of education: BA degree  . Highest education level:  Not on file  Occupational History  . Occupation: stay at home mom    Employer: UNEMPLOYED  Social Needs  . Financial resource strain: Not on file  . Food insecurity:    Worry: Never true    Inability: Never true  . Transportation needs:    Medical: No    Non-medical: No  Tobacco Use  . Smoking status: Never Smoker  . Smokeless tobacco: Never Used  Substance and Sexual Activity  . Alcohol use: No    Alcohol/week: 0.0 standard drinks  . Drug use: No  . Sexual activity: Yes    Birth control/protection: None  Lifestyle  . Physical activity:    Days per week: Not on file    Minutes per session: Not on file  . Stress: Not on file  Relationships  . Social connections:    Talks on phone: Not on file    Gets together: Not on file    Attends religious service: Not on file    Active member of club or organization: Not on file    Attends meetings of clubs or organizations: Not on file    Relationship status: Not on file  . Intimate partner violence:    Fear of current or ex partner: Not on file    Emotionally abused: Not on file    Physically abused: Not on file    Forced sexual activity: Not on file  Other  Topics Concern  . Not on file  Social History Narrative   Patient is married with 2 children.   Patient is right handed.   Patient has her  BA degree.   Patient drinks 1 cup daily.    Review of Systems  Constitution: Positive for weight gain. Negative for decreased appetite, malaise/fatigue and weight loss.  Eyes: Negative for visual disturbance.  Cardiovascular: Positive for dyspnea on exertion (over exertion) and leg swelling. Negative for chest pain, claudication, orthopnea, palpitations and syncope.  Respiratory: Negative for hemoptysis and wheezing.   Endocrine: Negative for cold intolerance and heat intolerance.  Hematologic/Lymphatic: Does not bruise/bleed easily.  Skin: Negative for nail changes.  Musculoskeletal: Negative for muscle weakness and myalgias.  Gastrointestinal: Negative for abdominal pain, change in bowel habit, nausea and vomiting.  Neurological: Negative for difficulty with concentration, dizziness, focal weakness and headaches.  Psychiatric/Behavioral: Negative for altered mental status and suicidal ideas.  All other systems reviewed and are negative.     Objective:  Blood pressure 132/75, height 5' 6.5" (1.689 m), weight 230 lb (104.3 kg). Body mass index is 36.57 kg/m.    Physical exam not performed or limited due to virtual visit.  Patient appeared to be in no distress, Neck was supple, respiration was not labored.  Please see exam details from prior visit is as below.   Physical Exam  Constitutional: She is oriented to person, place, and time. Vital signs are normal. She appears well-developed and well-nourished.  HENT:  Head: Normocephalic and atraumatic.  Neck: Normal range of motion.  Cardiovascular: Normal rate, regular rhythm and intact distal pulses.  Murmur heard.  Harsh midsystolic murmur is present with a grade of 1/6 at the upper right sternal border. Trace leg edema Pulses:      Femoral pulses are 2+ on the right side and 2+ on the  left side.      Popliteal pulses are 2+ on the right side and 2+ on the left side.       Dorsalis pedis pulses are 2+ on the right  side and 2+ on the left side.       Posterior tibial pulses are 2+ on the right side and 2+ on the left side.  Pulmonary/Chest: Effort normal and breath sounds normal. No accessory muscle usage. No respiratory distress.  Abdominal: Soft. Bowel sounds are normal.  Musculoskeletal: Normal range of motion.  Neurological: She is alert and oriented to person, place, and time.  Skin: Skin is warm and dry.  Vitals reviewed.  Radiology: No results found.  Laboratory examination:    CMP Latest Ref Rng & Units 04/20/2018 11/03/2017 09/08/2017  Glucose 70 - 99 mg/dL 240(H) 128(H) -  BUN 6 - 20 mg/dL 22(H) 11 -  Creatinine 0.44 - 1.00 mg/dL 1.11(H) 0.70 -  Sodium 135 - 145 mmol/L 137 141 -  Potassium 3.5 - 5.1 mmol/L 3.6 3.7 -  Chloride 98 - 111 mmol/L 99 100 -  CO2 22 - 32 mmol/L 25 24 -  Calcium 8.9 - 10.3 mg/dL 10.1 9.1 -  Total Protein 6.5 - 8.1 g/dL 8.0 6.8 6.7  Total Bilirubin 0.3 - 1.2 mg/dL 0.8 0.3 -  Alkaline Phos 38 - 126 U/L 60 54 -  AST 15 - 41 U/L 18 14 -  ALT 0 - 44 U/L 14 13 -   CBC Latest Ref Rng & Units 04/20/2018 07/05/2017 05/11/2017  WBC 4.0 - 10.5 K/uL 13.1(H) 6.1 8.3  Hemoglobin 12.0 - 15.0 g/dL 13.5 12.1 12.3  Hematocrit 36.0 - 46.0 % 42.4 38.4 38.1  Platelets 150 - 400 K/uL 348 317 281   Lipid Panel     Component Value Date/Time   CHOL 169 02/15/2018 1034   TRIG 168 (H) 02/15/2018 1034   HDL 41 02/15/2018 1034   CHOLHDL 6.0 10/17/2011 0315   VLDL 59 (H) 10/17/2011 0315   LDLCALC 94 02/15/2018 1034   HEMOGLOBIN A1C Lab Results  Component Value Date   HGBA1C 8.5 (H) 02/15/2018   MPG 169 05/18/2015   TSH Recent Labs    09/08/17 1129 11/03/17 1310  TSH 2.020 0.947    PRN Meds:. There are no discontinued medications. Current Meds  Medication Sig  . allopurinol (ZYLOPRIM) 100 MG tablet Take 100 mg by mouth every evening.   Marland Kitchen amLODipine (NORVASC) 10 MG tablet TAKE 1 TABLET BY MOUTH EVERY DAY  . aspirin 325 MG tablet Take 325 mg by mouth at bedtime.   Marland Kitchen atorvastatin (LIPITOR) 10 MG tablet TAKE 1 TABLET BY MOUTH EVERY DAY  . cetirizine (ZYRTEC) 10 MG tablet Take 10 mg by mouth daily as needed for allergies.   . cloNIDine (CATAPRES) 0.1 MG tablet Take 0.1 mg by mouth 2 (two) times daily.  . colchicine 0.6 MG tablet Take 0.6 mg by mouth daily as needed (For gout flare-up.).   Marland Kitchen EPINEPHrine 0.3 mg/0.3 mL IJ SOAJ injection INJECT AS DIRECTED AS NEEDED FOR ALLERGIC REACTION  . fluticasone (FLONASE) 50 MCG/ACT nasal spray daily as needed.   . gabapentin (NEURONTIN) 600 MG tablet Take 600 mg by mouth 3 (three) times daily.   . hydrochlorothiazide (HYDRODIURIL) 50 MG tablet Take 50 mg by mouth daily.   . insulin aspart (NOVOLOG) 100 UNIT/ML injection Inject 5-15 Units into the skin 3 (three) times daily with meals. Per sliding scale--pt uses Omnipod and gets a basal rate   . Insulin Glargine (BASAGLAR KWIKPEN) 100 UNIT/ML SOPN Inject 18 Units into the skin daily.   . Liraglutide (VICTOZA) 18 MG/3ML SOPN Inject 1.2 mg into the skin daily.  Marland Kitchen omeprazole (  PRILOSEC) 40 MG capsule TAKE 1 CAPSULE BY MOUTH EVERY DAY  . ondansetron (ZOFRAN-ODT) 4 MG disintegrating tablet Take 1 tablet (4 mg total) by mouth every 8 (eight) hours as needed for nausea or vomiting. Dissolve under tongue  . propranolol (INDERAL) 20 MG tablet Take 20 mg by mouth daily.  Marland Kitchen spironolactone (ALDACTONE) 50 MG tablet Take 1 tablet (50 mg total) by mouth daily.  Manus Gunning BOWEL PREP KIT 17.5-3.13-1.6 GM/177ML SOLN Suprep-Use as directed  . tiZANidine (ZANAFLEX) 2 MG tablet TAKE 1 TABLET AT BEDTIME DAILY. CAN TAKE 1/2 TABLET IN THE AM AS NEEDED (Patient taking differently: Take 1-2 mg by mouth See admin instructions. Take 85m at bedtime daily. Can take 168min the morning as needed for muscle spasms.)  . traMADol (ULTRAM) 50 MG tablet TAKE 1 TABLET BY MOUTH 3 TIMES  A DAY AS NEEDED (Patient taking differently: Take 50 mg by mouth 3 (three) times daily. BID (may take TID as needed))  . Vitamin D, Ergocalciferol, (DRISDOL) 1.25 MG (50000 UT) CAPS capsule Take 1 capsule (50,000 Units total) by mouth every 7 (seven) days.    Cardiac Studies:   Echocardiogram 08/21/2017: Left ventricle cavity is normal in size. Mild concentric hypertrophy of the left ventricle. Normal global wall motion. Doppler evidence of grade II (pseudonormal) diastolic dysfunction, elevated LAP. Calculated EF 55%. Mildly restricted aortic valve leaflets with trace aortic valve stenosis. Aortic valve mean gradient of 8 mmHg, Vmax of 2.0 m/s. Calculated aortic valve area by continuity equation is 1.4 cm. No evidence of pulmonary hypertension.  Lexiscan myoview stress test 08/21/2017:  1. Lexiscan stress test was performed. Exercise capacity was not assessed. Resting BP 148/90 mmHg, peak effect BP 168/90 mmHg. Stress symptoms included dizziness, nausea, headache, chest tightness.  2. The overall quality of the study is good. There is no evidence of abnormal lung activity. Stress and rest SPECT images demonstrate homogeneous tracer distribution throughout the myocardium. Gated SPECT imaging reveals normal myocardial thickening and wall motion. The left ventricular ejection fraction was normal calculated as 45%, although visually appears normal.  3. Low risk study.  ABI 08/21/2017: This exam reveals normal perfusion of both the lower extremity (RABI 1.27 and LABI 1.20 with biphasic waveform). ABI may be falsely elevated in patients with DM and medial calcinosis.  Assessment:   Chronic diastolic (congestive) heart failure (HCC)  Essential hypertension  Obesity (BMI 30-39.9)  History of CVA with residual deficit  EKG 03/22/2018: Normal sinus rhythm at 83 bpm with left atrial enlargement, normal axis, no evidece of ischemia.   Recommendations:   This is a 3-56-monthllow-up for  chronic diastolic heart failure.  Symptomatically, patient is presently doing well.  She denies any worsening dyspnea on exertion or leg edema, has had some improvement with increased dose of Aldactone.  Blood pressure has also improved.  We will continue the same.  Kidney function has remained stable.  I suspect her diastolic dysfunction is secondary to her obesity.  She [facility] try daily [facility].  She has history of CVA with unknown etiology.  Has had negative hypercoagulable work-up.  It does not appear she has had evaluation for atrial fibrillation and does have questionable history of obstructive sleep apnea, could consider evaluation for atrial fibrillation with possible event monitor or loop recorder.  Will discuss at her next office visit.  She is currently working with GI for evaluation for colitis.  She is now back  on daily aspirin and has not had any recurrent bleeding.  She will continue the same.  She is presently doing well from cardiac standpoint, we will see her back in 3 months or sooner if needed.  Miquel Dunn, MSN, APRN, FNP-C [facility]. [place] Office: [phone] Fax: [phone]

## 2018-06-22 ENCOUNTER — Encounter: Payer: Self-pay | Admitting: Cardiology

## 2018-06-23 ENCOUNTER — Other Ambulatory Visit: Payer: Self-pay

## 2018-06-23 ENCOUNTER — Encounter (INDEPENDENT_AMBULATORY_CARE_PROVIDER_SITE_OTHER): Payer: Self-pay | Admitting: Family Medicine

## 2018-06-23 ENCOUNTER — Ambulatory Visit (INDEPENDENT_AMBULATORY_CARE_PROVIDER_SITE_OTHER): Payer: HMO | Admitting: Family Medicine

## 2018-06-23 DIAGNOSIS — R110 Nausea: Secondary | ICD-10-CM | POA: Diagnosis not present

## 2018-06-23 DIAGNOSIS — Z6836 Body mass index (BMI) 36.0-36.9, adult: Secondary | ICD-10-CM | POA: Diagnosis not present

## 2018-06-23 DIAGNOSIS — E7849 Other hyperlipidemia: Secondary | ICD-10-CM | POA: Diagnosis not present

## 2018-06-23 MED ORDER — ATORVASTATIN CALCIUM 10 MG PO TABS: 10.0000 mg | ORAL_TABLET | Freq: Every day | ORAL | 0 refills | Status: DC

## 2018-06-24 NOTE — Progress Notes (Signed)
Office: [phone]  /  Fax: [phone] TeleHealth Visit:  [name] has verbally consented to this TeleHealth visit today. The patient is located at home, the provider is located at the [employer] [facility] office. The participants in this visit include the listed provider and patient. The visit was conducted today via Webex.  HPI:   Chief Complaint: OBESITY Tina [name] is here to discuss her progress with her obesity treatment plan. She is on the Category 3 plan and is following her eating plan approximately 50 % of the time. She states she is doing yoga 30 minutes 7 times per week. Tina [name] is 230 pounds which is down 2 pounds from the last telehealth visit. She reports nausea in the morning. She is unable to eat breakfast. She is seeing gastroenterology for this but they are unsure why she is having nausea. Her first meal of the day is between 2 to 4pm. Tina [name] has been exposed to [place] and has been tested.  We were unable to weigh the patient today for this TeleHealth visit. She feels as if she has lost weight since her last visit. She has lost 5 lbs since starting treatment with [name].  Hyperlipidemia Tina [name] has hyperlipidemia and her last LDL was 94 which is at goal. Her HDL was slightly low at 41 and triglycerides were elevated at 168 on 02/15/18. She has been trying to improve her cholesterol levels with intensive lifestyle modification including a low saturated fat diet, exercise and weight loss. She denies any chest pain or shortness of breath.  Nausea Tina [name] is seeing gastroenterology for persistant nausea and a repeat EGD is planned. She is supplementing with protein shakes.  ASSESSMENT AND PLAN:  Other hyperlipidemia - Plan: atorvastatin (LIPITOR) 10 MG tablet  Nausea  Class 2 severe obesity with serious comorbidity and body mass index (BMI) of 36.0 to 36.9 in adult, unspecified obesity type ([place])  PLAN:  Hyperlipidemia Keishla was informed of the American  Heart Association Guidelines emphasizing intensive lifestyle modifications as the first line treatment for hyperlipidemia. We discussed many lifestyle modifications today in depth, and Faten will continue to work on decreasing saturated fats such as fatty red meat, butter and many fried foods. She will also increase vegetables and lean protein in her diet and continue to work on exercise and weight loss efforts. Tylyn agrees to continue atorvastatin 10 mg qd #30 with no refills and to follow up in 2 to 3 weeks.  Nausea Tina [name] agrees to follow up with her GI doctor and to follow up with [name] as directed.  Obesity Tina [name] is currently in the action stage of change. As such, her goal is to continue with weight loss efforts. She has agreed to follow the Category 3 plan. Tina [name] may have protein shakes with 25 to 30 grams of protein, 1 per day to try to maximize protein while she is having nausea and skipping meals. . Destany has been instructed to continue her current regimen with yoga. We discussed the following Behavioral Modification Strategies today: increasing lean protein intake and planning for success.  Tina [name] has agreed to follow up with our clinic in 2 to 3 weeks. She was informed of the importance of frequent follow up visits to maximize her success with intensive lifestyle modifications for her multiple health conditions.  ALLERGIES: Allergies  Allergen Reactions  . Contrast Media [Iodinated Diagnostic Agents] Other (See Comments)    Difficulty breathing  . Iohexol Hives, Nausea And Vomiting and Swelling  Desc: Magnevist-gadolinium-difficulty breathing, throat swelling   . Midazolam Hcl Anaphylaxis    Difficulty breathing  . Shellfish Allergy Anaphylaxis  . Valsartan Swelling  . Metformin And Related Diarrhea and Nausea And Vomiting  . Other Itching    Patient is allergic to all nuts except peanuts.   . Avandia [Rosiglitazone Maleate] Hives and Other (See Comments)  .  Geodon [Ziprasidone] Other (See Comments)    UNKNOWN  . Kiwi Extract Itching and Swelling  . Latex Itching    MEDICATIONS: Current Outpatient Medications on File Prior to Visit  Medication Sig Dispense Refill  . allopurinol (ZYLOPRIM) 100 MG tablet Take 100 mg by mouth every evening.    Marland Kitchen amLODipine (NORVASC) 10 MG tablet TAKE 1 TABLET BY MOUTH EVERY DAY 90 tablet 1  . aspirin 325 MG tablet Take 325 mg by mouth at bedtime.     . cetirizine (ZYRTEC) 10 MG tablet Take 10 mg by mouth daily as needed for allergies.   11  . cloNIDine (CATAPRES) 0.1 MG tablet Take 0.1 mg by mouth 2 (two) times daily.  0  . colchicine 0.6 MG tablet Take 0.6 mg by mouth daily as needed (For gout flare-up.).   1  . EPINEPHrine 0.3 mg/0.3 mL IJ SOAJ injection INJECT AS DIRECTED AS NEEDED FOR ALLERGIC REACTION 1 Device 2  . fluticasone (FLONASE) 50 MCG/ACT nasal spray daily as needed.     . gabapentin (NEURONTIN) 600 MG tablet Take 600 mg by mouth 3 (three) times daily.     . hydrochlorothiazide (HYDRODIURIL) 50 MG tablet Take 50 mg by mouth daily.     . insulin aspart (NOVOLOG) 100 UNIT/ML injection Inject 5-15 Units into the skin 3 (three) times daily with meals. Per sliding scale--pt uses Omnipod and gets a basal rate     . Insulin Glargine (BASAGLAR KWIKPEN) 100 UNIT/ML SOPN Inject 18 Units into the skin daily.     . Liraglutide (VICTOZA) 18 MG/3ML SOPN Inject 1.2 mg into the skin daily.    Marland Kitchen omeprazole (PRILOSEC) 40 MG capsule TAKE 1 CAPSULE BY MOUTH EVERY DAY 90 capsule 1  . ondansetron (ZOFRAN-ODT) 4 MG disintegrating tablet Take 1 tablet (4 mg total) by mouth every 8 (eight) hours as needed for nausea or vomiting. Dissolve under tongue 90 tablet 1  . propranolol (INDERAL) 20 MG tablet Take 20 mg by mouth daily.    Marland Kitchen spironolactone (ALDACTONE) 50 MG tablet Take 1 tablet (50 mg total) by mouth daily. 90 tablet 3  . SUPREP BOWEL PREP KIT 17.5-3.13-1.6 GM/177ML SOLN Suprep-Use as directed 354 mL 0  . tiZANidine  (ZANAFLEX) 2 MG tablet TAKE 1 TABLET AT BEDTIME DAILY. CAN TAKE 1/2 TABLET IN THE AM AS NEEDED (Patient taking differently: Take 1-2 mg by mouth See admin instructions. Take 61m at bedtime daily. Can take 176min the morning as needed for muscle spasms.) 135 tablet 1  . traMADol (ULTRAM) 50 MG tablet TAKE 1 TABLET BY MOUTH 3 TIMES A DAY AS NEEDED (Patient taking differently: Take 50 mg by mouth 3 (three) times daily. BID (may take TID as needed)) 90 tablet 3  . Vitamin D, Ergocalciferol, (DRISDOL) 1.25 MG (50000 UT) CAPS capsule Take 1 capsule (50,000 Units total) by mouth every 7 (seven) days. 4 capsule 0   No current facility-administered medications on file prior to visit.     PAST MEDICAL HISTORY: Past Medical History:  Diagnosis Date  . Allergy   . Anemia    DURING MENSES--HAS HEAVY BLEEDING  WITH PERODS  . Anxiety   . Back pain, chronic    "ongoing"  . Blood transfusion    IN 2012  AFTER C -SECTION  . Cerebral thrombosis with cerebral infarction [facility]) JUNE 2011   RIGHT SIDED WEAKNESS ( ARM AND LEG ) AND SPASMS-remains with slight weakness and vertigo.  . Constipation   . Depression   . Diabetes mellitus   . Diabetic neuropathy ([place])    BOTH FEET --COMES AND GOES  . Edema, lower extremity   . Endometriosis   . Fatty liver   . GERD (gastroesophageal reflux disease)    with pregnancy  . H/O eye surgery   . Headache(784.0)    MIGRAINES--NOT REALLY HEADACHE-MORE LIKE PRESSURE SENSATION IN HEAD-FEELS DIZZIY AND  FAINT AS THE PRESSURE RESOLVES  . Hernia, incisional, RLQ, s/p lap repair Sep 2013 09/02/2011  . History of vertigo 03/22/2018  . Hx of migraines 10/19/2011  . Hypertension   . Leg pain, right    "like bad Charley horse"  . Multiple food allergies   . Panic disorder without agoraphobia   . Rash    HANDS, ARMS --STATES HX OF RASH EVER SINCE CHILDBIRTH/PREGNANCY.  STATES THE RASH OFTEN OCCURS WHEN SHE IS REALLY STRESSED."goes and comes-presently left ring finger"  .  Restless leg syndrome    DIAGNOSED BY SLEEP STUDY - PT TOLD SHE DID NOT HAVE SLEEP APNEA  . Right rotator cuff tear    PAIN IN RIGHT SHOULDER  . SBO (small bowel obstruction) ([place]) 06/03/2012  . Shortness of breath   . Spastic hemiplegia affecting dominant side ([place])   . Stomach problems   . Stroke ([place])   . Ventral hernia    RIGHT LOWER QUADRANT-CAUSING SOME PAIN  . Weakness of right side of body     PAST SURGICAL HISTORY: Past Surgical History:  Procedure Laterality Date  . APPLICATION OF WOUND VAC N/A 05/25/2015   Procedure: APPLICATION OF WOUND VAC;  Surgeon: Michael Boston, MD;  Location: [facility];  Service: General;  Laterality: N/A;  . CESAREAN SECTION  2012  . COLONOSCOPY    . DIAGNOSTIC LAPAROSCOPY    . ESOPHAGOGASTRODUODENOSCOPY N/A 06/03/2012   Procedure: ESOPHAGOGASTRODUODENOSCOPY (EGD);  Surgeon: [name] Craver, MD;  Location: [facility] ENDOSCOPY;  Service: Endoscopy;  Laterality: N/A;  . EXCISION MASS ABDOMINAL N/A 05/25/2015   Procedure: ABDOMINAL WALL EXPLORATION EXCISION OF SEROMA REMOVAL OF REDUNDANT SKIN ;  Surgeon: Michael Boston, MD;  Location: [facility];  Service: General;  Laterality: N/A;  . EYE SURGERY     Eye laser for vessel hemorrhaging  . fybroid removal    . HERNIA REPAIR  10/03/11   ventral hernia repair  . INSERTION OF MESH N/A 02/04/2013   Procedure: INSERTION OF MESH;  Surgeon: Adin Hector, MD;  Location: [facility];  Service: General;  Laterality: N/A;  . UMBILICAL HERNIA REPAIR N/A 02/04/2013   Procedure: LAPAROSCOPIC ventral wall hernia repair LAPAROSCOPIC LYSIS OF ADHESIONS laparoscopic exploration of abdomen ;  Surgeon: Adin Hector, MD;  Location: [facility];  Service: General;  Laterality: N/A;  . UPPER GASTROINTESTINAL ENDOSCOPY    . URETER REVISION     Bilateral "twisted"  . UTERINE FIBROID SURGERY     2 SURGERIES FOR FIBROIDS  . VENTRAL HERNIA REPAIR  10/03/2011   Procedure: LAPAROSCOPIC VENTRAL HERNIA;  Surgeon: Adin Hector, MD;  Location: [facility];   Service: General;  Laterality: N/A;    SOCIAL HISTORY: Social History   Tobacco Use  .  Smoking status: Never Smoker  . Smokeless tobacco: Never Used  Substance Use Topics  . Alcohol use: No    Alcohol/week: 0.0 standard drinks  . Drug use: No    FAMILY HISTORY: Family History  Problem Relation Age of Onset  . Diabetes Father   . Kidney disease Father   . Depression Father   . Drug abuse Father   . Allergic rhinitis Mother   . Eczema Mother   . Urticaria Mother   . Depression Mother   . Anxiety disorder Mother   . Bipolar disorder Mother   . Alcoholism Mother   . Drug abuse Mother   . Eating disorder Mother   . Diabetes Maternal Grandmother   . Hyperlipidemia Paternal Grandmother   . Stroke Paternal Grandmother   . Eczema Sister   . Urticaria Sister   . Colon cancer Paternal Uncle   . Other Neg Hx   . Angioedema Neg Hx   . Asthma Neg Hx   . Colon polyps Neg Hx   . Esophageal cancer Neg Hx   . Rectal cancer Neg Hx   . Stomach cancer Neg Hx     ROS: Review of Systems  Respiratory: Negative for shortness of breath.   Cardiovascular: Negative for chest pain.  Gastrointestinal: Positive for nausea.    PHYSICAL EXAM: Pt in no acute distress  RECENT LABS AND TESTS: BMET    Component Value Date/Time   NA 137 04/20/2018 1537   NA 141 11/03/2017 1310   K 3.6 04/20/2018 1537   CL 99 04/20/2018 1537   CO2 25 04/20/2018 1537   GLUCOSE 240 (H) 04/20/2018 1537   BUN 22 (H) 04/20/2018 1537   BUN 11 11/03/2017 1310   CREATININE 1.11 (H) 04/20/2018 1537   CALCIUM 10.1 04/20/2018 1537   GFRNONAA 59 (L) 04/20/2018 1537   GFRAA >60 04/20/2018 1537   Lab Results  Component Value Date   HGBA1C 8.5 (H) 02/15/2018   HGBA1C 8.4 (H) 11/03/2017   HGBA1C 7.5 (H) 05/18/2015   HGBA1C 8.7 (H) 02/05/2013   HGBA1C 6.9 (H) 06/03/2012   No results found for: INSULIN CBC    Component Value Date/Time   WBC 13.1 (H) 04/20/2018 1537   RBC 5.26 (H) 04/20/2018 1537   HGB  13.5 04/20/2018 1537   HCT 42.4 04/20/2018 1537   PLT 348 04/20/2018 1537   MCV 80.6 04/20/2018 1537   MCH 25.7 (L) 04/20/2018 1537   MCHC 31.8 04/20/2018 1537   RDW 14.6 04/20/2018 1537   LYMPHSABS 1.3 05/11/2017 1323   MONOABS 0.6 05/11/2017 1323   EOSABS 0.2 05/11/2017 1323   BASOSABS 0.0 05/11/2017 1323   Iron/TIBC/Ferritin/ %Sat    Component Value Date/Time   IRON 114 11/01/2015 1031   TIBC 372 11/01/2015 1031   FERRITIN 35 11/01/2015 1031   IRONPCTSAT 31 11/01/2015 1031   Lipid Panel     Component Value Date/Time   CHOL 169 02/15/2018 1034   TRIG 168 (H) 02/15/2018 1034   HDL 41 02/15/2018 1034   CHOLHDL 6.0 10/17/2011 0315   VLDL 59 (H) 10/17/2011 0315   LDLCALC 94 02/15/2018 1034   Hepatic Function Panel     Component Value Date/Time   PROT 8.0 04/20/2018 1537   PROT 6.8 11/03/2017 1310   ALBUMIN 3.7 04/20/2018 1537   ALBUMIN 3.8 11/03/2017 1310   AST 18 04/20/2018 1537   ALT 14 04/20/2018 1537   ALKPHOS 60 04/20/2018 1537   BILITOT 0.8 04/20/2018 1537  BILITOT 0.3 11/03/2017 1310   BILIDIR <0.1 06/03/2012 0220   IBILI NOT CALCULATED 06/03/2012 0220      Component Value Date/Time   TSH 0.947 11/03/2017 1310   TSH 2.020 09/08/2017 1129   TSH 1.045 06/03/2012 0520   Results for LISABETH, MIAN (MRN [id number]) as of 06/24/2018 11:21  Ref. Range 02/15/2018 10:34  Vitamin D, 25-Hydroxy Latest Ref Range: 30.0 - 100.0 ng/mL 26.5 (L)    I, Marcille Blanco, CMA, am acting as Location manager for [employer], FNP-C  I have reviewed the above documentation for accuracy and completeness, and I agree with the above.  - Denise Bramblett, FNP-C.

## 2018-06-24 NOTE — Progress Notes (Signed)
Office: [phone]  /  Fax: [phone]    Date: June 28, 2018   Appointment Start Time: 8:30am Duration: 30 minutes Provider: Glennie Isle, Psy.D. Type of Session: Individual Therapy  Location of Patient: Home Location of Provider: Provider's Home Type of Contact: Telepsychological Visit via [employer] WebEx   Session Content: Tina [name] is a 48 y.o. female presenting via [place] for a follow-up appointment to address the previously established treatment goal of decreasing emotional eating. Today's appointment was a telepsychological visit, as this provider's clinic is seeing a limited number of patients for in-person visits due to COVID-19. Therapeutic services will resume to in-person appointments once deemed appropriate. Tina [name] expressed understanding regarding the rationale for telepsychological services, and provided verbal consent for today's appointment. Prior to proceeding with today's appointment, Tina [name]'s physical location at the time of this appointment was obtained. Tina [name] reported she was at home and provided the address. In the event of technical difficulties, Tina [name] shared a phone number she could be reached at. Tina [name] and this provider participated in today's telepsychological service. Also, Tina [name] denied anyone else being on the WebEx appointment. Of note, Tina [name] initially shared her husband was in the kitchen to prepare a meal. She indicated, "It's okay" to continue and was understanding of its impact on confidentiality. At 8:39am, she reported he left and no one else was present.   This provider conducted a brief check-in and verbally administered the PHQ-9 and GAD-7. Tina [name] reported she was tested for COVID-19, as her handyman recently tested positive. She is waiting for her results, but she noted she is "feeling good." Additionally, Tina [name] discussed waking up early for "me time" and noted she is meditating and engaging in yoga. Moreover, she discussed she continues to  experience nausea in the morning; therefore, her GI prescribed an anti-nausea. The aforementioned has impacted her ability to eat breakfast. She indicated she informed Tina Schwab, FNP-C and it was reportedly recommended she try protein shakes for breakfast. Moreover, Tina [name] reported she "dropped the ball" on scheduling an appointment for therapy. She indicated she was informed she would need a referral from her PCP. It was recommended she MyChart her PCP to request a referral. Tina [name] agreed. Regarding eating, Tina [name] reported, "The emotional eating has gotten better. I've actually lost weight." She reported two episodes of vomiting due to not feeling well and reflecting on what she consumed. She added, "It's not like I put my finger down my throat." It was recommended she inform Tina [name] during their next appointment; Margrette agreed. She noted she has an appointment with her GI on June 26th and it was recommended she inform her GI doctor as well; Tina [name] agreed. Tina [name] believes a reduction in the emotional eating is secondary to increased awareness and her increased ability to cope. Tina [name] of today's appointment focused further on triggers for emotional eating. She acknowledged watching commercials for food results in cravings for sweets. As such, she noted she avoids purchasing sweets and tries to keep busy. She also discussed engaging in positive self-talk. Tina [name] was encouraged to utilize the handout previously sent between now and the next appointment to increase awareness of triggers and frequency. Tina [name] agreed. This provider also discussed behavioral strategies for specific triggers, such as placing the utensil down when conversing to avoid mindless eating. Tina [name] was receptive to today's session as evidenced by openness to sharing, responsiveness to feedback, and willingness to further explore triggers emotional eating. Tina [name] was receptive to today's session as evidenced by openness to  sharing, responsiveness to feedback,  and willingness to further explore triggers emotional eating.   Mental Status Examination:  Appearance: neat Behavior: cooperative Mood: euthymic Affect: mood congruent Speech: normal in rate, volume, and tone Eye Contact: appropriate Psychomotor Activity: appropriate Thought Process: linear, logical, and goal directed  Content/Perceptual Disturbances: denies suicidal and homicidal ideation, plan, and intent and no hallucinations, delusions, bizarre thinking or behavior reported or observed Orientation: time, person, place and purpose of appointment Cognition/Sensorium: memory, attention, language, and fund of knowledge intact  Insight: good Judgment: good  Structured Assessment Results: The Patient Health Questionnaire-9 (PHQ-9) is a self-report measure that assesses symptoms and severity of depression over the course of the last two weeks. Tina [name] obtained a score of 3 suggesting minimal depression. Tina [name] finds the endorsed symptoms to be somewhat difficult. Tina [name] interest 0  Down, depressed, hopeless 0  Altered sleeping 0  Tired, Tina [name] energy 1  Change in appetite 1  Feeling bad or failure about yourself 0  Trouble concentrating 1  Moving slowly or fidgety/restless 0  Suicidal thoughts 0  PHQ-9 Score 3    The Generalized Anxiety Disorder-7 (GAD-7) is a brief self-report measure that assesses symptoms of anxiety over the course of the last two weeks. Tina [name] obtained a score of 1 suggesting minimal anxiety. Tina [name] finds the endorsed symptoms to be not difficult at all. Nervous, anxious, on edge 1  Control/stop worrying 0  Worrying too much- different things 0  Trouble relaxing 0  Restless 0  Easily annoyed or irritable 0  Afraid-awful might happen 0  GAD-7 Score 1   Interventions:  Conducted a brief chart review Verbal administration of PHQ-9 and GAD-7 for symptom monitoring Provided empathic reflections and validation  Psychoeducation provided regarding triggers for emotional eating Provided positive reinforcement Employed supportive psychotherapy interventions to facilitate reduced distress, and to improve coping skills with identified stressors  DSM-5 Diagnosis: 296.32 (F33.1) Major Depressive Disorder, Recurrent Episode, Moderate, With Anxious Distress  Treatment Goal & Progress: During the initial appointment with this provider, the following treatment goal was established: decrease emotional eating. Chairty has demonstrated progress in her goal as evidenced by increased awareness of hunger patterns and triggers for emotional eating. She discussed an increased ability to cope and noted a reduction in emotional eating since the onset of treatment with this provider.   Plan: Tina [name] continues to appear able and willing to participate as evidenced by engagement in reciprocal conversation, and asking questions for clarification as appropriate. The next appointment will be scheduled in two weeks, which will be via [employer]. Once this provider's office resumes in-person appointments and it is deemed appropriate, Tina [name] will be notified. The next session will focus on reviewing triggers for emotional eating, and the introduction of mindfulness.

## 2018-06-25 ENCOUNTER — Ambulatory Visit: Payer: HMO | Admitting: Cardiology

## 2018-06-28 ENCOUNTER — Encounter (INDEPENDENT_AMBULATORY_CARE_PROVIDER_SITE_OTHER): Payer: Self-pay | Admitting: Family Medicine

## 2018-06-28 ENCOUNTER — Other Ambulatory Visit: Payer: Self-pay

## 2018-06-28 ENCOUNTER — Ambulatory Visit (INDEPENDENT_AMBULATORY_CARE_PROVIDER_SITE_OTHER): Payer: HMO | Admitting: Psychology

## 2018-06-28 DIAGNOSIS — E1140 Type 2 diabetes mellitus with diabetic neuropathy, unspecified: Secondary | ICD-10-CM | POA: Diagnosis not present

## 2018-06-28 DIAGNOSIS — Z794 Long term (current) use of insulin: Secondary | ICD-10-CM | POA: Diagnosis not present

## 2018-06-28 DIAGNOSIS — F331 Major depressive disorder, recurrent, moderate: Secondary | ICD-10-CM | POA: Diagnosis not present

## 2018-06-28 DIAGNOSIS — E118 Type 2 diabetes mellitus with unspecified complications: Secondary | ICD-10-CM | POA: Diagnosis not present

## 2018-06-30 DIAGNOSIS — I639 Cerebral infarction, unspecified: Secondary | ICD-10-CM | POA: Diagnosis not present

## 2018-06-30 DIAGNOSIS — I10 Essential (primary) hypertension: Secondary | ICD-10-CM | POA: Diagnosis not present

## 2018-06-30 DIAGNOSIS — E1140 Type 2 diabetes mellitus with diabetic neuropathy, unspecified: Secondary | ICD-10-CM | POA: Diagnosis not present

## 2018-06-30 DIAGNOSIS — G629 Polyneuropathy, unspecified: Secondary | ICD-10-CM | POA: Diagnosis not present

## 2018-06-30 DIAGNOSIS — Z6838 Body mass index (BMI) 38.0-38.9, adult: Secondary | ICD-10-CM | POA: Diagnosis not present

## 2018-06-30 DIAGNOSIS — E559 Vitamin D deficiency, unspecified: Secondary | ICD-10-CM | POA: Diagnosis not present

## 2018-06-30 DIAGNOSIS — E118 Type 2 diabetes mellitus with unspecified complications: Secondary | ICD-10-CM | POA: Diagnosis not present

## 2018-06-30 DIAGNOSIS — E7800 Pure hypercholesterolemia, unspecified: Secondary | ICD-10-CM | POA: Diagnosis not present

## 2018-07-02 ENCOUNTER — Encounter: Payer: Self-pay | Admitting: Physical Medicine & Rehabilitation

## 2018-07-02 ENCOUNTER — Other Ambulatory Visit: Payer: Self-pay

## 2018-07-02 ENCOUNTER — Encounter: Payer: HMO | Attending: Physical Medicine & Rehabilitation | Admitting: Physical Medicine & Rehabilitation

## 2018-07-02 VITALS — BP 129/83 | HR 77 | Temp 98.9°F | Ht 65.0 in | Wt 234.0 lb

## 2018-07-02 DIAGNOSIS — M533 Sacrococcygeal disorders, not elsewhere classified: Secondary | ICD-10-CM | POA: Insufficient documentation

## 2018-07-02 DIAGNOSIS — G894 Chronic pain syndrome: Secondary | ICD-10-CM | POA: Insufficient documentation

## 2018-07-02 DIAGNOSIS — S76019D Strain of muscle, fascia and tendon of unspecified hip, subsequent encounter: Secondary | ICD-10-CM | POA: Diagnosis not present

## 2018-07-02 DIAGNOSIS — Z79891 Long term (current) use of opiate analgesic: Secondary | ICD-10-CM | POA: Diagnosis not present

## 2018-07-02 DIAGNOSIS — Z5181 Encounter for therapeutic drug level monitoring: Secondary | ICD-10-CM | POA: Insufficient documentation

## 2018-07-02 MED ORDER — TRAMADOL HCL 50 MG PO TABS: 50.0000 mg | ORAL_TABLET | Freq: Three times a day (TID) | ORAL | 5 refills | Status: DC

## 2018-07-02 NOTE — Progress Notes (Signed)
Subjective:    Patient ID: Tina [name], female    DOB: 1970-07-21, 48 y.o.   MRN: [id number] 48 yo female with history of left pontine infarct in 2011.  She has a chronic mild Right hemiparesis and Right hemisensory deficits with dysesthesias on the right . Left >Right low back pain and spasms as well as radiating to lateral Left knee No falls or trauma to that area.  Pt states the pain is a bit more lateral than usual pain.  Pt using Heat and massager, heating pain , epsom salts, stretching forward, backward and side stretches.  Still does HEP from PT for calf raises and abduction ex for hips   Heme workup neg for hypercoagulable state  GI saw evidence of ischemic colitis Repeat upper and lower endoscopy scheduled in 1 wk  Pain Inventory Average Pain 7 Pain Right Now 7 My pain is stabbing  In the last 24 hours, has pain interfered with the following? General activity 7 Relation with others 7 Enjoyment of life 7 What TIME of day is your pain at its worst? all Sleep (in general) Poor  Pain is worse with: walking and standing Pain improves with: medication Relief from Meds: 6  Mobility use a cane how many minutes can you walk? 20 ability to climb steps?  yes do you drive?  yes  Function disabled: date disabled na  Neuro/Psych tingling spasms dizziness confusion depression anxiety  Prior Studies Any changes since last visit?  no  Physicians involved in your care Primary care . Neurologist . Psychiatrist .   Family History  Problem Relation Age of Onset  . Diabetes Father   . Kidney disease Father   . Depression Father   . Drug abuse Father   . Allergic rhinitis Mother   . Eczema Mother   . Urticaria Mother   . Depression Mother   . Anxiety disorder Mother   . Bipolar disorder Mother   . Alcoholism Mother   . Drug abuse Mother   . Eating disorder Mother   . Diabetes Maternal Grandmother   . Hyperlipidemia Paternal Grandmother   . Stroke  Paternal Grandmother   . Eczema Sister   . Urticaria Sister   . Colon cancer Paternal Uncle   . Other Neg Hx   . Angioedema Neg Hx   . Asthma Neg Hx   . Colon polyps Neg Hx   . Esophageal cancer Neg Hx   . Rectal cancer Neg Hx   . Stomach cancer Neg Hx    Social History   Socioeconomic History  . Marital status: Married    Spouse name: Annie Main  . Number of children: 3  . Years of education: BA degree  . Highest education level: Not on file  Occupational History  . Occupation: stay at home mom    Employer: UNEMPLOYED  Social Needs  . Financial resource strain: Not on file  . Food insecurity    Worry: Never true    Inability: Never true  . Transportation needs    Medical: No    Non-medical: No  Tobacco Use  . Smoking status: Never Smoker  . Smokeless tobacco: Never Used  Substance and Sexual Activity  . Alcohol use: No    Alcohol/week: 0.0 standard drinks  . Drug use: No  . Sexual activity: Yes    Birth control/protection: None  Lifestyle  . Physical activity    Days per week: Not on file    Minutes per session: Not on  file  . Stress: Not on file  Relationships  . Social Herbalist on phone: Not on file    Gets together: Not on file    Attends religious service: Not on file    Active member of club or organization: Not on file    Attends meetings of clubs or organizations: Not on file    Relationship status: Not on file  Other Topics Concern  . Not on file  Social History Narrative   Patient is married with 2 children.   Patient is right handed.   Patient has her  BA degree.   Patient drinks 1 cup daily.   Past Surgical History:  Procedure Laterality Date  . APPLICATION OF WOUND VAC N/A 05/25/2015   Procedure: APPLICATION OF WOUND VAC;  Surgeon: Michael Boston, MD;  Location: [facility];  Service: General;  Laterality: N/A;  . CESAREAN SECTION  2012  . COLONOSCOPY    . DIAGNOSTIC LAPAROSCOPY    . ESOPHAGOGASTRODUODENOSCOPY N/A 06/03/2012    Procedure: ESOPHAGOGASTRODUODENOSCOPY (EGD);  Surgeon: [name] Craver, MD;  Location: [facility] ENDOSCOPY;  Service: Endoscopy;  Laterality: N/A;  . EXCISION MASS ABDOMINAL N/A 05/25/2015   Procedure: ABDOMINAL WALL EXPLORATION EXCISION OF SEROMA REMOVAL OF REDUNDANT SKIN ;  Surgeon: Michael Boston, MD;  Location: [facility];  Service: General;  Laterality: N/A;  . EYE SURGERY     Eye laser for vessel hemorrhaging  . fybroid removal    . HERNIA REPAIR  10/03/11   ventral hernia repair  . INSERTION OF MESH N/A 02/04/2013   Procedure: INSERTION OF MESH;  Surgeon: Adin Hector, MD;  Location: [facility];  Service: General;  Laterality: N/A;  . UMBILICAL HERNIA REPAIR N/A 02/04/2013   Procedure: LAPAROSCOPIC ventral wall hernia repair LAPAROSCOPIC LYSIS OF ADHESIONS laparoscopic exploration of abdomen ;  Surgeon: Adin Hector, MD;  Location: [facility];  Service: General;  Laterality: N/A;  . UPPER GASTROINTESTINAL ENDOSCOPY    . URETER REVISION     Bilateral "twisted"  . UTERINE FIBROID SURGERY     2 SURGERIES FOR FIBROIDS  . VENTRAL HERNIA REPAIR  10/03/2011   Procedure: LAPAROSCOPIC VENTRAL HERNIA;  Surgeon: Adin Hector, MD;  Location: [facility];  Service: General;  Laterality: N/A;   Past Medical History:  Diagnosis Date  . Allergy   . Anemia    DURING MENSES--HAS HEAVY BLEEDING WITH PERODS  . Anxiety   . Back pain, chronic    "ongoing"  . Blood transfusion    IN 2012  AFTER C -SECTION  . Cerebral thrombosis with cerebral infarction [facility]) JUNE 2011   RIGHT SIDED WEAKNESS ( ARM AND LEG ) AND SPASMS-remains with slight weakness and vertigo.  . Constipation   . Depression   . Diabetes mellitus   . Diabetic neuropathy ([place])    BOTH FEET --COMES AND GOES  . Edema, lower extremity   . Endometriosis   . Fatty liver   . GERD (gastroesophageal reflux disease)    with pregnancy  . H/O eye surgery   . Headache(784.0)    MIGRAINES--NOT REALLY HEADACHE-MORE LIKE PRESSURE SENSATION IN HEAD-FEELS DIZZIY AND   FAINT AS THE PRESSURE RESOLVES  . Hernia, incisional, RLQ, s/p lap repair Sep 2013 09/02/2011  . History of vertigo 03/22/2018  . Hx of migraines 10/19/2011  . Hypertension   . Leg pain, right    "like bad Charley horse"  . Multiple food allergies   . Panic disorder without agoraphobia   .  Rash    HANDS, ARMS --STATES HX OF RASH EVER SINCE CHILDBIRTH/PREGNANCY.  STATES THE RASH OFTEN OCCURS WHEN SHE IS REALLY STRESSED."goes and comes-presently left ring finger"  . Restless leg syndrome    DIAGNOSED BY SLEEP STUDY - PT TOLD SHE DID NOT HAVE SLEEP APNEA  . Right rotator cuff tear    PAIN IN RIGHT SHOULDER  . SBO (small bowel obstruction) ([place]) 06/03/2012  . Shortness of breath   . Spastic hemiplegia affecting dominant side ([place])   . Stomach problems   . Stroke ([place])   . Ventral hernia    RIGHT LOWER QUADRANT-CAUSING SOME PAIN  . Weakness of right side of body    BP 129/83   Pulse 77   Temp 98.9 F (37.2 C)   Ht 5\' 5"  (1.651 m)   Wt 234 lb (106.1 kg)   SpO2 96%   BMI 38.94 kg/m   Opioid Risk Score:   Fall Risk Score:  `1  Depression screen PHQ 2/9  Depression screen [facility] 2/9 05/18/2018 05/13/2018 01/28/2018 01/01/2018 11/03/2017 01/23/2017 12/12/2016  Decreased Interest 3 1 1 1 2  0 0  Down, Depressed, Hopeless 2 0 0 1 3 0 0  PHQ - 2 Score 5 1 1 2 5  0 0  Altered sleeping 3 - - - 2 - -  Tired, decreased energy 3 - - - 3 - -  Change in appetite 3 - - - 3 - -  Feeling bad or failure about yourself  3 - - - 3 - -  Trouble concentrating 2 - - - 1 - -  Moving slowly or fidgety/restless 0 - - - 0 - -  Suicidal thoughts 0 - - - 1 - -  PHQ-9 Score 19 - - - 18 - -  Difficult doing work/chores - - - - Somewhat difficult - -  Some recent data might be hidden    Review of Systems  Constitutional: Positive for appetite change and diaphoresis.  Gastrointestinal: Positive for abdominal pain, constipation, diarrhea, nausea and vomiting.  Musculoskeletal: Positive for back pain.        Spasms Shoulder pain Foot pain   Skin: Negative.   Neurological: Positive for dizziness. Negative for weakness.       Tingling  Hematological: Negative.   Psychiatric/Behavioral: Positive for confusion.  All other systems reviewed and are negative.      Objective:   Physical Exam Constitutional:      Appearance: She is obese.  HENT:     Head: Normocephalic and atraumatic.  Eyes:     Extraocular Movements: Extraocular movements intact.     Conjunctiva/sclera: Conjunctivae normal.     Pupils: Pupils are equal, round, and reactive to light.  Skin:    General: Skin is warm and dry.  Neurological:     General: No focal deficit present.     Mental Status: She is alert and oriented to person, place, and time.  Psychiatric:        Mood and Affect: Mood normal.   Motor strength 4+ Right and 5/5 Left  delt, bi, tri, grip , HF, KE, ADF  Mild tenderness over bilateral glut medius as well asl L5-S1 paraspinal area  Neg SLR  Gait no evidence of toe drag or knee instability      Assessment & Plan:  1.  Chronic low back pain with hx of sacroiliac disorder and myofascial pain syndrome Pt does not wish to pursue trigger point  Injection or Sacroiliac injection.  THese were last performed >55yr ago.   She is applying heat, using massage pad as well as taking her tramadol and tizanidine If she fails to improve, pt may call for trigger point injection in Glut medius Blateral   Swab today for toxicology given chronic tramadol  Cont Tramadol 50mg  TID Tizanidine 1 po qhs

## 2018-07-02 NOTE — Patient Instructions (Addendum)
Please increase Gabapentin to 4 times per day for the next month or two.  THen try to go back to 3 times per day.  Please call for injection if back does not improve

## 2018-07-06 NOTE — Progress Notes (Signed)
Office: [phone]  /  Fax: [phone]    Date: July 12, 2018   Appointment Start Time: 10:05am Duration: 25 minutes Provider: Glennie Isle, Psy.D. Type of Session: Individual Therapy  Location of Patient: Home Location of Provider: Provider's Home Type of Contact: Telepsychological Visit via [employer]   Session Content: Tina [name] is a 48 y.o. female presenting via [employer] for a follow-up appointment to address the previously established treatment goal of decreasing emotional eating. Of note, this provider called [name] as she did not present for today's appointment. She explained issues with her e-mail account; therefore, the e-mail with the secure link was re-sent. As such, today's appointment was initiated 5 minutes late.Today's appointment was a telepsychological visit, as this provider's clinic is seeing a limited number of patients for in-person visits due to COVID-19. Therapeutic services will resume to in-person appointments once deemed appropriate. [name] expressed understanding regarding the rationale for telepsychological services, and provided verbal consent for today's appointment. Prior to proceeding with today's appointment, [name]'s physical location at the time of this appointment was obtained. [name] reported she was at home and provided the address. In the event of technical difficulties, [name] shared a phone number she could be reached at. [name] and this provider participated in today's telepsychological service. Also, [name] denied anyone else being present in the room or on the WebEx appointment.  This provider conducted a brief check-in and verbally administered the PHQ-9 and GAD-7. [name] acknowledged eating a box of "Devil Dogs" and she clarified it was not during one seating. She believes it might be related to "the time of the month." This was explored further. She reported consuming 6 packages over the course of 2 days. Aside from the aforementioned,  [name] denied any other episodes of emotional eating. Triggers for emotional eating were further reviewed. [name] reported experiencing the following trigger: out of habit. She noted her current coping mechanisms (e.g., yoga and meditation) has helped reduce the frequency of the other triggers (e.g., stress and unpleasant emotions) for emotional eating. Session then focused on exploring compliance with the meal plan overall. It was determined that there were various deviations after her colonoscopy on Friday. Thus, psychoeducation regarding all or nothing was provided. It was recommended she take it moment by moment in regard to her meals/snacks to help reduce all or nothing thinking as well as increase protein intake. Moreover, psychoeducation regarding mindfulness was provided. A handout was provided to [name] with further information regarding mindfulness, including exercises. This provider also explained the benefit of mindfulness as it relates to emotional eating. Karoline was encouraged to engage in the provided exercises between now and the next appointment with this provider. [name] agreed. She was led through an exercise involving her 5 senses during today's appointment. [name] provided verbal consent during today's appointment for this provider to send the handout on mindfulnes via e-mail. Regarding establishing care for longer-term services, [name] reported she "slacked on that." She provided verbal consent for this provider to place a referral indicating it would be therapy for emotional eating, ongoing stressors, and depression. Session also focused on termination planning and she was agreeable to terminating once established with a new provider. [name] was receptive to today's session as evidenced by openness to sharing, responsiveness to feedback, and engaging in mindfulness.   Mental Status Examination:  Appearance: neat Behavior: cooperative Mood: euthymic Affect: mood congruent Speech:  normal in rate, volume, and tone Eye Contact: appropriate Psychomotor Activity: appropriate Thought Process: linear, logical, and goal directed  Content/Perceptual  Disturbances: denies suicidal and homicidal ideation, plan, and intent and no hallucinations, delusions, bizarre thinking or behavior reported or observed Orientation: time, person, place and purpose of appointment Cognition/Sensorium: memory, attention, language, and fund of knowledge intact  Insight: fair Judgment: fair  Structured Assessment Results: The Patient Health Questionnaire-9 (PHQ-9) is a self-report measure that assesses symptoms and severity of depression over the course of the last two weeks. [name] obtained a score of 4 suggesting minimal depression. [name] finds the endorsed symptoms to be somewhat difficult. Little interest or pleasure in doing things 0  Feeling down, depressed, or hopeless 0  Trouble falling or staying asleep, or sleeping too much 1  Feeling tired or having little energy 1  Poor appetite or overeating 1  Feeling bad about yourself --- or that you are a failure or have let yourself or your family down 1  Trouble concentrating on things, such as reading the newspaper or watching television 0  Moving or speaking so slowly that other people could have noticed? Or the opposite --- being so fidgety or restless that you have been moving around a lot more than usual 0  Thoughts that you would be better off dead or hurting yourself in some way 0  PHQ-9 Score 4    The Generalized Anxiety Disorder-7 (GAD-7) is a brief self-report measure that assesses symptoms of anxiety over the course of the last two weeks. [name] obtained a score of 2 suggesting minimal anxiety. [name] finds the endorsed symptoms to be not difficult at all. Feeling nervous, anxious, on edge 1  Not being able to stop or control worrying 0  Worrying too much about different things 0  Trouble relaxing 0  Being so restless that it's  hard to sit still 0  Becoming easily annoyed or irritable 1  Feeling afraid as if something awful might happen 0  GAD-7 Score 2   Interventions:  Conducted a brief chart review Verbal administration of PHQ-9 and GAD-7 for symptom monitoring Provided empathic reflections and validation Reviewed content from the previous session Psychoeducation provided regarding mindfulness Engaged patient in a mindfulness exercise Discussed termination planning Discussed option for a referral for longer-term therapeutic services Provided positive reinforcement Employed supportive psychotherapy interventions to facilitate reduced distress, and to improve coping skills with identified stressors Employed acceptance and commitment interventions to emphasize mindfulness and acceptance without struggle  DSM-5 Diagnosis: 296.31 (F33.0) Major Depressive Disorder, Recurrent Episode, Mild, With Anxious Distress  Treatment Goal & Progress: During the initial appointment with this provider, the following treatment goal was established: decrease emotional eating. Grisell has demonstrated progress in her goal as evidenced by increased awareness of hunger patterns and triggers for emotional eating. Despite episodes of emotional eating, Angelynn continues to demonstrate willingness to engage in learned skills to assist with coping.   Plan: Jilliann continues to appear able and willing to participate as evidenced by engagement in reciprocal conversation, and asking questions for clarification as appropriate. The next appointment will be scheduled in two weeks, which will be via [employer]. Once this provider's office resumes in-person appointments and it is deemed appropriate, Vanette will be notified. The next session will focus further on mindfulness. Additionally, a referral will be placed for longer-term therapeutic services.

## 2018-07-07 ENCOUNTER — Other Ambulatory Visit: Payer: Self-pay

## 2018-07-07 ENCOUNTER — Encounter (INDEPENDENT_AMBULATORY_CARE_PROVIDER_SITE_OTHER): Payer: Self-pay | Admitting: Family Medicine

## 2018-07-07 ENCOUNTER — Telehealth (INDEPENDENT_AMBULATORY_CARE_PROVIDER_SITE_OTHER): Payer: HMO | Admitting: Family Medicine

## 2018-07-07 DIAGNOSIS — E559 Vitamin D deficiency, unspecified: Secondary | ICD-10-CM | POA: Diagnosis not present

## 2018-07-07 DIAGNOSIS — Z6836 Body mass index (BMI) 36.0-36.9, adult: Secondary | ICD-10-CM

## 2018-07-07 DIAGNOSIS — I10 Essential (primary) hypertension: Secondary | ICD-10-CM

## 2018-07-07 MED ORDER — VITAMIN D (ERGOCALCIFEROL) 1.25 MG (50000 UNIT) PO CAPS: 50000.0000 [IU] | ORAL_CAPSULE | ORAL | 0 refills | Status: DC

## 2018-07-08 ENCOUNTER — Telehealth: Payer: Self-pay | Admitting: Gastroenterology

## 2018-07-08 LAB — DRUG TOX MONITOR 1 W/CONF, ORAL FLD

## 2018-07-08 LAB — DRUG TOX ALC METAB W/CON, ORAL FLD: Alcohol Metabolite: NEGATIVE ng/mL (ref <25)

## 2018-07-08 NOTE — Telephone Encounter (Signed)

## 2018-07-08 NOTE — Progress Notes (Signed)
Office: [phone]  /  Fax: [phone] TeleHealth Visit:  [name] has verbally consented to this TeleHealth visit today. The patient is located in the car, the provider is located at the [facility] office. The participants in this visit include the listed provider, patient, and the patient's husband. The visit was conducted today via telephone call only (16 minutes).  HPI:   Chief Complaint: OBESITY Tina [name] is here to discuss her progress with her obesity treatment plan. She is on the Category 3 plan and is following her eating plan approximately 25% of the time. She states she is doing yoga 30 minutes 7 times per week. Her weight was 234 lbs at a recent physical medicine appointment. She reports her Gabapentin dose was increased recently which increased her weight. She reports nausea and is still unable to eat all food on the plan. Cause for this is still being evaluated by GI. She is doing better with liquids in the a.m than solid food. She states she is able to eat chicken throughout the day to increase her protein intake. She also drinks home made fruit smoothies made with yogurt (not [place]). She denies that these smoothies spike ger blood sugars. We were unable to weigh the patient today for this TeleHealth visit. She states a recent weight was 234 lbs. She has lost 5 lbs since starting treatment with [name].  Vitamin D deficiency [name] has a diagnosis of Vitamin D deficiency,m which is not at goal. Her last Vitamin D level was reported to be 26.5 on 02/15/2018. She is currently taking prescription Vit D and denies nausea, vomiting or muscle weakness.  Hypertension [name] is a 48 y.o. female with hypertension-well controlled on clonidine, Norvasc, HCTZ, and spironolactone. [name] denies chest pain or shortness of breath on exertion. She is working weight loss to help control her blood pressure with the goal of decreasing her risk of heart  attack and stroke. [name]'s blood pressure was 129/73 today at home. BP Readings from Last 3 Encounters:  07/02/18 129/83  06/21/18 132/75  06/03/18 138/69     ASSESSMENT AND PLAN:  Vitamin D deficiency - Plan: Vitamin D, Ergocalciferol, (DRISDOL) 1.25 MG (50000 UT) CAPS capsule  Essential hypertension  Class 2 severe obesity with serious comorbidity and body mass index (BMI) of 36.0 to 36.9 in adult, unspecified obesity type (HCC)  PLAN:  Vitamin D Deficiency [name] was informed that low Vitamin D levels contributes to fatigue and are associated with obesity, breast, and colon cancer. She agrees to continue to take prescription Vit D @ 50,000 IU every week #4 with 0 refills and will follow-up for routine testing of Vitamin D, at least 2-3 times per year. She was informed of the risk of over-replacement of Vitamin D and agrees to not increase her dose unless she discusses this with [name] first. [name] agrees to follow-up with our clinic in 2 weeks.  Hypertension We discussed sodium restriction, working on healthy weight loss, and a regular exercise program as the means to achieve improved blood pressure control. [name] agreed with this plan and agreed to follow up as directed. We will continue to monitor her blood pressure as well as her progress with the above lifestyle modifications. She will continue her medications as prescribed and will watch for signs of hypotension as she continues her lifestyle modifications.  Obesity [name] is currently in the action stage of change. As such, her goal is to continue with weight loss efforts.  She has agreed to follow the Category 3 plan. [name] was advised to add [place] yogurt to shakes rather than regular yogurt or add protein powder.She was to advised use fruits that are lower in sugar content and avoid bananas and pineapple as these tend to spike her blood sugar.  [name] has been instructed to continue her current exercise regimen for weight  loss and overall health benefits. We discussed the following Behavioral Modification Strategies today: increasing lean protein intake and planning for success.  [name] has agreed to follow-up with our clinic in 2 weeks. She was informed of the importance of frequent follow-up visits to maximize her success with intensive lifestyle modifications for her multiple health conditions.  ALLERGIES: Allergies  Allergen Reactions  . Contrast Media [Iodinated Diagnostic Agents] Other (See Comments)    Difficulty breathing  . Iohexol Hives, Nausea And Vomiting and Swelling     Desc: Magnevist-gadolinium-difficulty breathing, throat swelling   . Midazolam Hcl Anaphylaxis    Difficulty breathing  . Shellfish Allergy Anaphylaxis  . Valsartan Swelling  . Metformin And Related Diarrhea and Nausea And Vomiting  . Other Itching    Patient is allergic to all nuts except peanuts.   . Avandia [Rosiglitazone Maleate] Hives and Other (See Comments)  . Geodon [Ziprasidone] Other (See Comments)    UNKNOWN  . Kiwi Extract Itching and Swelling  . Latex Itching    MEDICATIONS: Current Outpatient Medications on File Prior to Visit  Medication Sig Dispense Refill  . allopurinol (ZYLOPRIM) 100 MG tablet Take 100 mg by mouth every evening.    Marland Kitchen amLODipine (NORVASC) 10 MG tablet TAKE 1 TABLET BY MOUTH EVERY DAY 90 tablet 1  . aspirin 325 MG tablet Take 325 mg by mouth at bedtime.     Marland Kitchen atorvastatin (LIPITOR) 10 MG tablet Take 1 tablet (10 mg total) by mouth daily. 30 tablet 0  . cetirizine (ZYRTEC) 10 MG tablet Take 10 mg by mouth daily as needed for allergies.   11  . cloNIDine (CATAPRES) 0.1 MG tablet Take 0.1 mg by mouth 2 (two) times daily.  0  . colchicine 0.6 MG tablet Take 0.6 mg by mouth daily as needed (For gout flare-up.).   1  . EPINEPHrine 0.3 mg/0.3 mL IJ SOAJ injection INJECT AS DIRECTED AS NEEDED FOR ALLERGIC REACTION 1 Device 2  . fluticasone (FLONASE) 50 MCG/ACT nasal spray daily as needed.      . gabapentin (NEURONTIN) 600 MG tablet Take 600 mg by mouth 3 (three) times daily.     . hydrochlorothiazide (HYDRODIURIL) 50 MG tablet Take 50 mg by mouth daily.     . insulin aspart (NOVOLOG) 100 UNIT/ML injection Inject 5-15 Units into the skin 3 (three) times daily with meals. Per sliding scale--pt uses Omnipod and gets a basal rate     . Insulin Glargine (BASAGLAR KWIKPEN) 100 UNIT/ML SOPN Inject 18 Units into the skin daily.     . Liraglutide (VICTOZA) 18 MG/3ML SOPN Inject 1.2 mg into the skin daily.    Marland Kitchen omeprazole (PRILOSEC) 40 MG capsule TAKE 1 CAPSULE BY MOUTH EVERY DAY 90 capsule 1  . ondansetron (ZOFRAN-ODT) 4 MG disintegrating tablet Take 1 tablet (4 mg total) by mouth every 8 (eight) hours as needed for nausea or vomiting. Dissolve under tongue 90 tablet 1  . propranolol (INDERAL) 20 MG tablet Take 20 mg by mouth daily.    Marland Kitchen spironolactone (ALDACTONE) 50 MG tablet Take 1 tablet (50 mg total) by mouth daily. 90 tablet  3  . SUPREP BOWEL PREP KIT 17.5-3.13-1.6 GM/177ML SOLN Suprep-Use as directed 354 mL 0  . tiZANidine (ZANAFLEX) 2 MG tablet TAKE 1 TABLET AT BEDTIME DAILY. CAN TAKE 1/2 TABLET IN THE AM AS NEEDED (Patient taking differently: Take 1-2 mg by mouth See admin instructions. Take 59m at bedtime daily. Can take 167min the morning as needed for muscle spasms.) 135 tablet 1  . traMADol (ULTRAM) 50 MG tablet Take 1 tablet (50 mg total) by mouth 3 (three) times daily. BID (may take TID as needed) 90 tablet 5   No current facility-administered medications on file prior to visit.     PAST MEDICAL HISTORY: Past Medical History:  Diagnosis Date  . Allergy   . Anemia    DURING MENSES--HAS HEAVY BLEEDING WITH PERODS  . Anxiety   . Back pain, chronic    "ongoing"  . Blood transfusion    IN 2012  AFTER C -SECTION  . [facility]   RIGHT SIDED WEAKNESS ( ARM AND LEG ) AND SPASMS-remains with slight weakness and vertigo.  .  Constipation   . Depression   . Diabetes mellitus   . Diabetic neuropathy ([place]   BOTH FEET --COMES AND GOES  . Edema, lower extremity   . Endometriosis   . Fatty liver   . GERD (gastroesophageal reflux disease)    with pregnancy  . H/O eye surgery   . Headache(784.0)    MIGRAINES--NOT REALLY HEADACHE-MORE LIKE PRESSURE SENSATION IN HEAD-FEELS DIZZIY AND  FAINT AS THE PRESSURE RESOLVES  . Hernia, incisional, RLQ, s/p lap repair Sep 2013 09/02/2011  . History of vertigo 03/22/2018  . Hx of migraines 10/19/2011  . Hypertension   . Leg pain, right    "like bad Charley horse"  . Multiple food allergies   . Panic disorder without agoraphobia   . Rash    HANDS, ARMS --STATES HX OF RASH EVER SINCE CHILDBIRTH/PREGNANCY.  STATES THE RASH OFTEN OCCURS WHEN SHE IS REALLY STRESSED."goes and comes-presently left ring finger"  . Restless leg syndrome    DIAGNOSED BY SLEEP STUDY - PT TOLD SHE DID NOT HAVE SLEEP APNEA  . Right rotator cuff tear    PAIN IN RIGHT SHOULDER  . SBO (small bowel obstruction) ([facility]  . Shortness of breath   . Spastic hemiplegia affecting dominant side ([facility]  . Stomach problems   . Stroke ([facility]  . Ventral hernia    RIGHT LOWER QUADRANT-CAUSING SOME PAIN  . Weakness of right side of body     PAST SURGICAL HISTORY: Past Surgical History:  Procedure Laterality Date  . APPLICATION OF WOUND VAC N/A 05/25/2015   Procedure: APPLICATION OF WOUND VAC;  Surgeon: StMichael BostonMD;  Location: [facility];  Service: General;  Laterality: N/A;  . CESAREAN SECTION  2012  . COLONOSCOPY    . DIAGNOSTIC LAPAROSCOPY    . ESOPHAGOGASTRODUODENOSCOPY N/A 06/03/2012   Procedure: ESOPHAGOGASTRODUODENOSCOPY (EGD);  Surgeon: JyJuanita CraverMD;  [facility];  Service: Endoscopy;  Laterality: N/A;  . EXCISION MASS ABDOMINAL N/A 05/25/2015   Procedure: ABDOMINAL WALL EXPLORATION EXCISION OF SEROMA REMOVAL OF REDUNDANT SKIN ;  Surgeon: StMichael BostonMD;  Location: [facility];   Service: General;  Laterality: N/A;  . EYE SURGERY     Eye laser for vessel hemorrhaging  . fybroid removal    . HERNIA REPAIR  10/03/11   ventral hernia repair  . INSERTION OF MESH N/A 02/04/2013  Procedure: INSERTION OF MESH;  Surgeon: Adin Hector, MD;  Location: [facility];  Service: General;  Laterality: N/A;  . UMBILICAL HERNIA REPAIR N/A 02/04/2013   Procedure: LAPAROSCOPIC ventral wall hernia repair LAPAROSCOPIC LYSIS OF ADHESIONS laparoscopic exploration of abdomen ;  Surgeon: Adin Hector, MD;  Location: [facility];  Service: General;  Laterality: N/A;  . UPPER GASTROINTESTINAL ENDOSCOPY    . URETER REVISION     Bilateral "twisted"  . UTERINE FIBROID SURGERY     2 SURGERIES FOR FIBROIDS  . VENTRAL HERNIA REPAIR  10/03/2011   Procedure: LAPAROSCOPIC VENTRAL HERNIA;  Surgeon: Adin Hector, MD;  Location: [facility];  Service: General;  Laterality: N/A;    SOCIAL HISTORY: Social History   Tobacco Use  . Smoking status: Never Smoker  . Smokeless tobacco: Never Used  Substance Use Topics  . Alcohol use: No    Alcohol/week: 0.0 standard drinks  . Drug use: No    FAMILY HISTORY: Family History  Problem Relation Age of Onset  . Diabetes Father   . Kidney disease Father   . Depression Father   . Drug abuse Father   . Allergic rhinitis Mother   . Eczema Mother   . Urticaria Mother   . Depression Mother   . Anxiety disorder Mother   . Bipolar disorder Mother   . Alcoholism Mother   . Drug abuse Mother   . Eating disorder Mother   . Diabetes Maternal Grandmother   . Hyperlipidemia Paternal Grandmother   . Stroke Paternal Grandmother   . Eczema Sister   . Urticaria Sister   . Colon cancer Paternal Uncle   . Other Neg Hx   . Angioedema Neg Hx   . Asthma Neg Hx   . Colon polyps Neg Hx   . Esophageal cancer Neg Hx   . Rectal cancer Neg Hx   . Stomach cancer Neg Hx    ROS: Review of Systems  Respiratory: Negative for shortness of breath.   Cardiovascular: Negative  for chest pain.  Gastrointestinal: Positive for nausea.  Musculoskeletal:       Negative for muscle weakness.   PHYSICAL EXAM: Pt in no acute distress  RECENT LABS AND TESTS: BMET    Component Value Date/Time   NA 137 04/20/2018 1537   NA 141 11/03/2017 1310   K 3.6 04/20/2018 1537   CL 99 04/20/2018 1537   CO2 25 04/20/2018 1537   GLUCOSE 240 (H) 04/20/2018 1537   BUN 22 (H) 04/20/2018 1537   BUN 11 11/03/2017 1310   CREATININE 1.11 (H) 04/20/2018 1537   CALCIUM 10.1 04/20/2018 1537   GFRNONAA 59 (L) 04/20/2018 1537   GFRAA >60 04/20/2018 1537   Lab Results  Component Value Date   HGBA1C 8.5 (H) 02/15/2018   HGBA1C 8.4 (H) 11/03/2017   HGBA1C 7.5 (H) 05/18/2015   HGBA1C 8.7 (H) 02/05/2013   HGBA1C 6.9 (H) 06/03/2012   No results found for: INSULIN CBC    Component Value Date/Time   WBC 13.1 (H) 04/20/2018 1537   RBC 5.26 (H) 04/20/2018 1537   HGB 13.5 04/20/2018 1537   HCT 42.4 04/20/2018 1537   PLT 348 04/20/2018 1537   MCV 80.6 04/20/2018 1537   MCH 25.7 (L) 04/20/2018 1537   MCHC 31.8 04/20/2018 1537   RDW 14.6 04/20/2018 1537   LYMPHSABS 1.3 05/11/2017 1323   MONOABS 0.6 05/11/2017 1323   EOSABS 0.2 05/11/2017 1323   BASOSABS 0.0 05/11/2017 1323   Iron/TIBC/Ferritin/ %  Sat    Component Value Date/Time   IRON 114 11/01/2015 1031   TIBC 372 11/01/2015 1031   FERRITIN 35 11/01/2015 1031   IRONPCTSAT 31 11/01/2015 1031   Lipid Panel     Component Value Date/Time   CHOL 169 02/15/2018 1034   TRIG 168 (H) 02/15/2018 1034   HDL 41 02/15/2018 1034   CHOLHDL 6.0 10/17/2011 0315   VLDL 59 (H) 10/17/2011 0315   LDLCALC 94 02/15/2018 1034   Hepatic Function Panel     Component Value Date/Time   PROT 8.0 04/20/2018 1537   PROT 6.8 11/03/2017 1310   ALBUMIN 3.7 04/20/2018 1537   ALBUMIN 3.8 11/03/2017 1310   AST 18 04/20/2018 1537   ALT 14 04/20/2018 1537   ALKPHOS 60 04/20/2018 1537   BILITOT 0.8 04/20/2018 1537   BILITOT 0.3 11/03/2017 1310    BILIDIR <0.1 06/03/2012 0220   IBILI NOT CALCULATED 06/03/2012 0220      Component Value Date/Time   TSH 0.947 11/03/2017 1310   TSH 2.020 09/08/2017 1129   TSH 1.045 06/03/2012 0520   Results for SAIDA, LONON (MRN [id number]) as of 07/08/2018 11:32  Ref. Range 02/15/2018 10:34  Vitamin D, 25-Hydroxy Latest Ref Range: 30.0 - 100.0 ng/mL 26.5 (L)   I, Michaelene Song, am acting as Location manager for [employer], FNP  I have reviewed the above documentation for accuracy and completeness, and I agree with the above.  - Eliceo Gladu, FNP-C.

## 2018-07-09 ENCOUNTER — Other Ambulatory Visit: Payer: Self-pay

## 2018-07-09 ENCOUNTER — Encounter: Payer: Self-pay | Admitting: Gastroenterology

## 2018-07-09 ENCOUNTER — Ambulatory Visit (AMBULATORY_SURGERY_CENTER): Payer: Federal, State, Local not specified - PPO | Admitting: Gastroenterology

## 2018-07-09 VITALS — BP 145/69 | HR 75 | Temp 98.6°F | Resp 17 | Ht 65.0 in | Wt 233.0 lb

## 2018-07-09 DIAGNOSIS — Z1211 Encounter for screening for malignant neoplasm of colon: Secondary | ICD-10-CM | POA: Diagnosis not present

## 2018-07-09 DIAGNOSIS — Z538 Procedure and treatment not carried out for other reasons: Secondary | ICD-10-CM | POA: Diagnosis not present

## 2018-07-09 DIAGNOSIS — K529 Noninfective gastroenteritis and colitis, unspecified: Secondary | ICD-10-CM | POA: Diagnosis not present

## 2018-07-09 MED ORDER — SODIUM CHLORIDE 0.9 % IV SOLN: 500.0000 mL | INTRAVENOUS | Status: DC

## 2018-07-09 MED ORDER — FLEET ENEMA 7-19 GM/118ML RE ENEM: 1.0000 | ENEMA | Freq: Once | RECTAL | 0 refills | Status: DC

## 2018-07-09 MED ORDER — FLEET ENEMA 7-19 GM/118ML RE ENEM
1.0000 | ENEMA | RECTAL | Status: AC
  Administered 2018-07-09: 1 via RECTAL

## 2018-07-09 NOTE — Op Note (Signed)
[place] Patient Name: Tina [name] Procedure Date: 07/09/2018 11:37 AM MRN: [id number] Endoscopist: Remo Lipps P. Havery Moros , MD Age: 48 Referring MD:  Date of Birth: January 04, 1971 Gender: Female Account #: [id number] Procedure:                Colonoscopy Indications:              Follow-up of colitis noted on prior colonoscopy -                            suspicious for ischemic colitis of the left and                            transverse colon but path showed nonspecific                            changes, also to complete screening given poor prep                            on the last exam. Patient had a double prep for                            this exam, and enema given pre-procedure. She did                            not feel evacuated after her second prep. Medicines:                Monitored Anesthesia Care Procedure:                Pre-Anesthesia Assessment:                           - Prior to the procedure, a History and Physical                            was performed, and patient medications and                            allergies were reviewed. The patient's tolerance of                            previous anesthesia was also reviewed. The risks                            and benefits of the procedure and the sedation                            options and risks were discussed with the patient.                            All questions were answered, and informed consent                            was obtained. Prior Anticoagulants: The patient has  taken no previous anticoagulant or antiplatelet                            agents. ASA Grade Assessment: III - A patient with                            severe systemic disease. After reviewing the risks                            and benefits, the patient was deemed in                            satisfactory condition to undergo the procedure.                           After  obtaining informed consent, the colonoscope                            was passed under direct vision. Throughout the                            procedure, the patient's blood pressure, pulse, and                            oxygen saturations were monitored continuously. The                            Model PCF-H190DL [id number]) scope was introduced                            through the anus with the intention of advancing to                            the cecum. The scope was advanced to the transverse                            colon before the procedure was aborted. Medications                            were given. The colonoscopy was performed without                            difficulty. The patient tolerated the procedure                            well. The quality of the bowel preparation was                            poor. The rectum was photographed. Scope In: Scope Out: Findings:                 The perianal and digital rectal examinations were  normal.                           A large amount of semi-solid stool was found in the                            transverse colon and in the ascending colon, making                            visualization difficult, could not visualize well                            enough to attempt cecal intubation. The previously                            noted inflammatory changes in the transverse colon                            had intervally resolved.                           The rectum, recto-sigmoid colon, sigmoid colon and                            descending colon appeared normal. No inflammatory                            changes, prep much better in this area likely due                            to recent enema.                           The exam was otherwise normal throughout the                            examined colon. Complications:            No immediate complications. Estimated blood loss:                             None. Estimated Blood Loss:     Estimated blood loss: none. Impression:               - Preparation of the colon was poor from the                            transverse to right colon and cecal intubation not                            attempted.                           - Left colon appeared normal.                           -  Interval resolution of prior inflammatory changes                            - I suspect the patient had ischemic colitis, see                            clinic note for full details.                           - Unfortunately not able to complete colon cancer                            screening on last 2 colonoscopies due to poor prep.                            Patient has chronic constipation. Recommendation:           - Patient has a contact number available for                            emergencies. The signs and symptoms of potential                            delayed complications were discussed with the                            patient. Return to normal activities tomorrow.                            Written discharge instructions were provided to the                            patient.                           - Resume previous diet.                           - Continue present medications.                           - Consideration for repeat CT scan if pain persists                           - Will discuss other modalities for colon cancer                            screening, consideration for Cologuard or virtual                            colonoscopy Remo Lipps P. Beyza Bellino, MD 07/09/2018 12:05:46 PM This report has been signed electronically.

## 2018-07-09 NOTE — Patient Instructions (Signed)
YOU HAD AN ENDOSCOPIC PROCEDURE TODAY AT THE [facility]:   Refer to the procedure report that was given to you for any specific questions about what was found during the examination.  If the procedure report does not answer your questions, please call your gastroenterologist to clarify.  If you requested that your care partner not be given the details of your procedure findings, then the procedure report has been included in a sealed envelope for you to review at your convenience later.  YOU SHOULD EXPECT: Some feelings of bloating in the abdomen. Passage of more gas than usual.  Walking can help get rid of the air that was put into your GI tract during the procedure and reduce the bloating. If you had a lower endoscopy (such as a colonoscopy or flexible sigmoidoscopy) you may notice spotting of blood in your stool or on the toilet paper. If you underwent a bowel prep for your procedure, you may not have a normal bowel movement for a few days.  Please Note:  You might notice some irritation and congestion in your nose or some drainage.  This is from the oxygen used during your procedure.  There is no need for concern and it should clear up in a day or so.  SYMPTOMS TO REPORT IMMEDIATELY:   Following lower endoscopy (colonoscopy or flexible sigmoidoscopy):  Excessive amounts of blood in the stool  Significant tenderness or worsening of abdominal pains  Swelling of the abdomen that is new, acute  Fever of 100F or higher  For urgent or emergent issues, a gastroenterologist can be reached at any hour by calling [phone].   DIET:  We do recommend a small meal at first, but then you may proceed to your regular diet.  Drink plenty of fluids but you should avoid alcoholic beverages for 24 hours.  ACTIVITY:  You should plan to take it easy for the rest of today and you should NOT DRIVE or use heavy machinery until tomorrow (because of the sedation medicines used during the test).     FOLLOW UP: Our staff will call the number listed on your records 48-72 hours following your procedure to check on you and address any questions or concerns that you may have regarding the information given to you following your procedure. If we do not reach you, we will leave a message.  We will attempt to reach you two times.  During this call, we will ask if you have developed any symptoms of COVID 19. If you develop any symptoms (ie: fever, flu-like symptoms, shortness of breath, cough etc.) before then, please call [phone].  If you test positive for Covid 19 in the 2 weeks post procedure, please call and report this information to us.    If any biopsies were taken you will be contacted by phone or by letter within the next 1-3 weeks.  Please call us at [phone] if you have not heard about the biopsies in 3 weeks.    SIGNATURES/CONFIDENTIALITY: You and/or your care partner have signed paperwork which will be entered into your electronic medical record.  These signatures attest to the fact that that the information above on your After Visit Summary has been reviewed and is understood.  Full responsibility of the confidentiality of this discharge information lies with you and/or your care-partner. 

## 2018-07-09 NOTE — Telephone Encounter (Signed)
Spoke with patient she answer no to all questions.

## 2018-07-09 NOTE — Progress Notes (Signed)
Pt's states no medical or surgical changes since previsit or office visit. 

## 2018-07-09 NOTE — Progress Notes (Signed)
Spoke with patient. She felt distended upon arriving. She completed first portion of prep no problem. First portion of second prep last night she passed a lot of liquids. She did completed second half of second prep this AM, but did not pass much. Enema given and small amount of sediment came back. We had her walk around and did positional changes and she passed a lot of gas, her distension has resolved and she thinks her abdomen is back to baseline. Her abdomen is soft on exam. She wishes to proceed with colonoscopy.

## 2018-07-09 NOTE — Progress Notes (Signed)
Patient states that she didn't have any bowel movements after the second part of her prep.   States that prep did not clean her out.  Dr. Havery Moros notified and ordered an enema.  Stated that we would "take it from there depending on the results.  Pt finally passed a lot of air; abd less distended and softer.  Dr. Kathlen Brunswick scopetodoay

## 2018-07-09 NOTE — Progress Notes (Signed)
A and O x3. Report to RN. Tolerated MAC anesthesia well.

## 2018-07-12 ENCOUNTER — Telehealth: Payer: Self-pay | Admitting: *Deleted

## 2018-07-12 ENCOUNTER — Ambulatory Visit (INDEPENDENT_AMBULATORY_CARE_PROVIDER_SITE_OTHER): Payer: Federal, State, Local not specified - PPO | Admitting: Psychology

## 2018-07-12 ENCOUNTER — Other Ambulatory Visit: Payer: Self-pay

## 2018-07-12 DIAGNOSIS — F330 Major depressive disorder, recurrent, mild: Secondary | ICD-10-CM | POA: Diagnosis not present

## 2018-07-12 NOTE — Telephone Encounter (Signed)
Oral swab drug screen was consistent for prescribed medications.  ?

## 2018-07-13 ENCOUNTER — Telehealth: Payer: Self-pay

## 2018-07-13 NOTE — Telephone Encounter (Signed)
  Follow up Call-  Call back number 07/09/2018 04/29/2018  Post procedure Call Back phone  # [phone] hm  Permission to leave phone message Yes Yes  Some recent data might be hidden     Patient questions:  Do you have a fever, pain , or abdominal swelling? No. Pain Score  0 *  Have you tolerated food without any problems? Yes.    Have you been able to return to your normal activities? Yes.    Do you have any questions about your discharge instructions: Diet   No. Medications  No. Follow up visit  No.  Do you have questions or concerns about your Care? No.  Actions: * If pain score is 4 or above: No action needed, pain <4.  1. Have you developed a fever since your procedure? no  2.   Have you had an respiratory symptoms (SOB or cough) since your procedure? no  3.   Have you tested positive for COVID 19 since your procedure no  4.   Have you had any family members/close contacts diagnosed with the COVID 19 since your procedure?  no   If yes to any of these questions please route to Joylene John, RN and Alphonsa Gin, Therapist, sports.

## 2018-07-22 ENCOUNTER — Encounter (INDEPENDENT_AMBULATORY_CARE_PROVIDER_SITE_OTHER): Payer: Self-pay | Admitting: Bariatrics

## 2018-07-22 ENCOUNTER — Other Ambulatory Visit: Payer: Self-pay

## 2018-07-22 ENCOUNTER — Ambulatory Visit (INDEPENDENT_AMBULATORY_CARE_PROVIDER_SITE_OTHER): Payer: HMO | Admitting: Bariatrics

## 2018-07-22 DIAGNOSIS — F3289 Other specified depressive episodes: Secondary | ICD-10-CM | POA: Diagnosis not present

## 2018-07-22 DIAGNOSIS — Z6835 Body mass index (BMI) 35.0-35.9, adult: Secondary | ICD-10-CM

## 2018-07-22 DIAGNOSIS — E1140 Type 2 diabetes mellitus with diabetic neuropathy, unspecified: Secondary | ICD-10-CM | POA: Diagnosis not present

## 2018-07-22 DIAGNOSIS — Z794 Long term (current) use of insulin: Secondary | ICD-10-CM

## 2018-07-26 ENCOUNTER — Encounter (INDEPENDENT_AMBULATORY_CARE_PROVIDER_SITE_OTHER): Payer: Self-pay | Admitting: Bariatrics

## 2018-07-26 NOTE — Progress Notes (Signed)
Office: [phone]  /  Fax: [phone] TeleHealth Visit:  Tina [name] has verbally consented to this TeleHealth visit today. The patient is located at home, the provider is located at the [employer] [facility] office. The participants in this visit include the listed provider and patient and any and all parties involved. The visit was conducted today via WebEx.  HPI:   Chief Complaint: OBESITY Tina [name] is here to discuss her progress with her obesity treatment plan. She is on the Category 3 plan and is following her eating plan approximately 75 % of the time. She states she is walking 1 1/2 to 2 miles 7 times per week. [name] states that she has lost 4 pounds (weight 229 lbs). She has modified some of her eating habits. She is doing well during the day. We were unable to weigh the patient today for this TeleHealth visit. She feels as if she has lost weight since her last visit. She has lost 9 lbs since starting treatment with [name].  Diabetes II Tina [name] has a diagnosis of diabetes type II. Tina [name] states fasting BGs range 110 and 130's and is occasionally 200 and he had one low blood sugar of 50, prepping for colonoscopy. She is okay with no fruit. Last A1c was at 8.5  She has been working on intensive lifestyle modifications including diet, exercise, and weight loss to help control her blood glucose levels.  Depression with emotional eating behaviors Tina [name] is seeing Dr. [name] (bariatric psychologist). She has decreased stress eating. Tina [name] struggles with emotional eating and using food for comfort to the extent that it is negatively impacting her health. She often snacks when she is not hungry. Tina [name] sometimes feels she is out of control and then feels guilty that she made poor food choices. She has been working on behavior modification techniques to help reduce her emotional eating and has been somewhat successful. She shows no sign of suicidal or homicidal ideations.   ASSESSMENT AND PLAN:  Type 2 diabetes mellitus with diabetic neuropathy, with long-term current use of insulin (HCC)  Other depression  Class 2 severe obesity with serious comorbidity and body mass index (BMI) of 35.0 to 35.9 in adult, unspecified obesity type ([place])  PLAN:  Diabetes II Tina [name] has been given extensive diabetes education by myself today including ideal fasting and post-prandial blood glucose readings, individual ideal Hgb A1c goals and hypoglycemia prevention. We discussed the importance of good blood sugar control to decrease the likelihood of diabetic complications such as nephropathy, neuropathy, limb loss, blindness, coronary artery disease, and death. We discussed the importance of intensive lifestyle modification including diet, exercise and weight loss as the first line treatment for diabetes. Kimberl agrees to continue her diabetes medications and will follow up at the agreed upon time.  Depression with Emotional Eating Behaviors We discussed behavior modification techniques today to help Tina [name] deal with her emotional eating and depression. She will follow up with Dr. [name].  Obesity Tina [name] is currently in the action stage of change. As such, her goal is to continue with weight loss efforts She has agreed to follow the Category 3 plan Tina [name] will continue his exercise regimen (walking) for weight loss and overall health benefits. We discussed the following Behavioral Modification Strategies today: increase H2O intake, no skipping meals, keeping healthy foods in the home, increasing lean protein intake, decreasing simple carbohydrates, increasing vegetables, decrease eating out and work on meal planning and intentional eating  Scherry has agreed to follow up with our  clinic in 2 weeks. She was informed of the importance of frequent follow up visits to maximize her success with intensive lifestyle modifications for her multiple health conditions.  ALLERGIES:  Allergies  Allergen Reactions  . Contrast Media [Iodinated Diagnostic Agents] Other (See Comments)    Difficulty breathing  . Iohexol Hives, Nausea And Vomiting and Swelling     Desc: Magnevist-gadolinium-difficulty breathing, throat swelling   . Midazolam Hcl Anaphylaxis    Difficulty breathing  . Shellfish Allergy Anaphylaxis  . Valsartan Swelling  . Metformin And Related Diarrhea and Nausea And Vomiting  . Other Itching    Patient is allergic to all nuts except peanuts.   . Avandia [Rosiglitazone Maleate] Hives and Other (See Comments)  . Geodon [Ziprasidone] Other (See Comments)    UNKNOWN  . Kiwi Extract Itching and Swelling  . Latex Itching    MEDICATIONS: Current Outpatient Medications on File Prior to Visit  Medication Sig Dispense Refill  . allopurinol (ZYLOPRIM) 100 MG tablet Take 100 mg by mouth every evening.    Marland Kitchen amLODipine (NORVASC) 10 MG tablet TAKE 1 TABLET BY MOUTH EVERY DAY 90 tablet 1  . aspirin 325 MG tablet Take 325 mg by mouth at bedtime.     Marland Kitchen atorvastatin (LIPITOR) 10 MG tablet Take 1 tablet (10 mg total) by mouth daily. 30 tablet 0  . cetirizine (ZYRTEC) 10 MG tablet Take 10 mg by mouth daily as needed for allergies.   11  . cloNIDine (CATAPRES) 0.1 MG tablet Take 0.1 mg by mouth 2 (two) times daily.  0  . colchicine 0.6 MG tablet Take 0.6 mg by mouth daily as needed (For gout flare-up.).   1  . EPINEPHrine 0.3 mg/0.3 mL IJ SOAJ injection INJECT AS DIRECTED AS NEEDED FOR ALLERGIC REACTION 1 Device 2  . fluticasone (FLONASE) 50 MCG/ACT nasal spray daily as needed.     . gabapentin (NEURONTIN) 600 MG tablet Take 600 mg by mouth 3 (three) times daily.     . hydrochlorothiazide (HYDRODIURIL) 50 MG tablet Take 50 mg by mouth daily.     . insulin aspart (NOVOLOG) 100 UNIT/ML injection Inject 5-15 Units into the skin 3 (three) times daily with meals. Per sliding scale--pt uses Omnipod and gets a basal rate     . Insulin Glargine (BASAGLAR KWIKPEN) 100 UNIT/ML  SOPN Inject 18 Units into the skin daily.     . Liraglutide (VICTOZA) 18 MG/3ML SOPN Inject 1.2 mg into the skin daily.    Marland Kitchen omeprazole (PRILOSEC) 40 MG capsule TAKE 1 CAPSULE BY MOUTH EVERY DAY 90 capsule 1  . ondansetron (ZOFRAN-ODT) 4 MG disintegrating tablet Take 1 tablet (4 mg total) by mouth every 8 (eight) hours as needed for nausea or vomiting. Dissolve under tongue 90 tablet 1  . propranolol (INDERAL) 20 MG tablet Take 20 mg by mouth daily.    Marland Kitchen spironolactone (ALDACTONE) 50 MG tablet Take 1 tablet (50 mg total) by mouth daily. 90 tablet 3  . SUPREP BOWEL PREP KIT 17.5-3.13-1.6 GM/177ML SOLN Suprep-Use as directed 354 mL 0  . tiZANidine (ZANAFLEX) 2 MG tablet TAKE 1 TABLET AT BEDTIME DAILY. CAN TAKE 1/2 TABLET IN THE AM AS NEEDED (Patient taking differently: Take 1-2 mg by mouth See admin instructions. Take 47m at bedtime daily. Can take 110min the morning as needed for muscle spasms.) 135 tablet 1  . traMADol (ULTRAM) 50 MG tablet Take 1 tablet (50 mg total) by mouth 3 (three) times daily. BID (may take TID  as needed) 90 tablet 5  . Vitamin D, Ergocalciferol, (DRISDOL) 1.25 MG (50000 UT) CAPS capsule Take 1 capsule (50,000 Units total) by mouth every 7 (seven) days. 4 capsule 0   No current facility-administered medications on file prior to visit.     PAST MEDICAL HISTORY: Past Medical History:  Diagnosis Date  . Allergy   . Anemia    DURING MENSES--HAS HEAVY BLEEDING WITH PERODS  . Anxiety   . Back pain, chronic    "ongoing"  . Blood transfusion    IN 2012  AFTER C -SECTION  . Cerebral thrombosis with cerebral infarction [facility]) JUNE 2011   RIGHT SIDED WEAKNESS ( ARM AND LEG ) AND SPASMS-remains with slight weakness and vertigo.  . Constipation   . Depression   . Diabetes mellitus   . Diabetic neuropathy ([place])    BOTH FEET --COMES AND GOES  . Edema, lower extremity   . Endometriosis   . Fatty liver   . GERD (gastroesophageal reflux disease)    with pregnancy  . H/O eye  surgery   . Headache(784.0)    MIGRAINES--NOT REALLY HEADACHE-MORE LIKE PRESSURE SENSATION IN HEAD-FEELS DIZZIY AND  FAINT AS THE PRESSURE RESOLVES  . Hernia, incisional, RLQ, s/p lap repair Sep 2013 09/02/2011  . History of vertigo 03/22/2018  . Hx of migraines 10/19/2011  . Hypertension   . Leg pain, right    "like bad Charley horse"  . Multiple food allergies   . Panic disorder without agoraphobia   . Rash    HANDS, ARMS --STATES HX OF RASH EVER SINCE CHILDBIRTH/PREGNANCY.  STATES THE RASH OFTEN OCCURS WHEN SHE IS REALLY STRESSED."goes and comes-presently left ring finger"  . Restless leg syndrome    DIAGNOSED BY SLEEP STUDY - PT TOLD SHE DID NOT HAVE SLEEP APNEA  . Right rotator cuff tear    PAIN IN RIGHT SHOULDER  . SBO (small bowel obstruction) ([place]) 06/03/2012  . Shortness of breath   . Spastic hemiplegia affecting dominant side ([place])   . Stomach problems   . Stroke ([place])   . Ventral hernia    RIGHT LOWER QUADRANT-CAUSING SOME PAIN  . Weakness of right side of body     PAST SURGICAL HISTORY: Past Surgical History:  Procedure Laterality Date  . APPLICATION OF WOUND VAC N/A 05/25/2015   Procedure: APPLICATION OF WOUND VAC;  Surgeon: Michael Boston, MD;  Location: [facility];  Service: General;  Laterality: N/A;  . CESAREAN SECTION  2012  . COLONOSCOPY    . DIAGNOSTIC LAPAROSCOPY    . ESOPHAGOGASTRODUODENOSCOPY N/A 06/03/2012   Procedure: ESOPHAGOGASTRODUODENOSCOPY (EGD);  Surgeon: [name] Craver, MD;  Location: [facility] ENDOSCOPY;  Service: Endoscopy;  Laterality: N/A;  . EXCISION MASS ABDOMINAL N/A 05/25/2015   Procedure: ABDOMINAL WALL EXPLORATION EXCISION OF SEROMA REMOVAL OF REDUNDANT SKIN ;  Surgeon: Michael Boston, MD;  Location: [facility];  Service: General;  Laterality: N/A;  . EYE SURGERY     Eye laser for vessel hemorrhaging  . fybroid removal    . HERNIA REPAIR  10/03/11   ventral hernia repair  . INSERTION OF MESH N/A 02/04/2013   Procedure: INSERTION OF MESH;  Surgeon: Adin Hector, MD;  Location: [facility];  Service: General;  Laterality: N/A;  . UMBILICAL HERNIA REPAIR N/A 02/04/2013   Procedure: LAPAROSCOPIC ventral wall hernia repair LAPAROSCOPIC LYSIS OF ADHESIONS laparoscopic exploration of abdomen ;  Surgeon: Adin Hector, MD;  Location: [facility];  Service: General;  Laterality: N/A;  .  UPPER GASTROINTESTINAL ENDOSCOPY    . URETER REVISION     Bilateral "twisted"  . UTERINE FIBROID SURGERY     2 SURGERIES FOR FIBROIDS  . VENTRAL HERNIA REPAIR  10/03/2011   Procedure: LAPAROSCOPIC VENTRAL HERNIA;  Surgeon: Adin Hector, MD;  Location: [facility];  Service: General;  Laterality: N/A;    SOCIAL HISTORY: Social History   Tobacco Use  . Smoking status: Never Smoker  . Smokeless tobacco: Never Used  Substance Use Topics  . Alcohol use: No    Alcohol/week: 0.0 standard drinks  . Drug use: No    FAMILY HISTORY: Family History  Problem Relation Age of Onset  . Diabetes Father   . Kidney disease Father   . Depression Father   . Drug abuse Father   . Allergic rhinitis Mother   . Eczema Mother   . Urticaria Mother   . Depression Mother   . Anxiety disorder Mother   . Bipolar disorder Mother   . Alcoholism Mother   . Drug abuse Mother   . Eating disorder Mother   . Diabetes Maternal Grandmother   . Hyperlipidemia Paternal Grandmother   . Stroke Paternal Grandmother   . Eczema Sister   . Urticaria Sister   . Colon cancer Paternal Uncle   . Other Neg Hx   . Angioedema Neg Hx   . Asthma Neg Hx   . Colon polyps Neg Hx   . Esophageal cancer Neg Hx   . Rectal cancer Neg Hx   . Stomach cancer Neg Hx     ROS: Review of Systems  Constitutional: Positive for weight loss.  Endo/Heme/Allergies:       Positive for hypoglycemia Positive for hyperglycemia  Psychiatric/Behavioral: Positive for depression. Negative for suicidal ideas.    PHYSICAL EXAM: Pt in no acute distress  RECENT LABS AND TESTS: BMET    Component Value Date/Time   NA 137  04/20/2018 1537   NA 141 11/03/2017 1310   K 3.6 04/20/2018 1537   CL 99 04/20/2018 1537   CO2 25 04/20/2018 1537   GLUCOSE 240 (H) 04/20/2018 1537   BUN 22 (H) 04/20/2018 1537   BUN 11 11/03/2017 1310   CREATININE 1.11 (H) 04/20/2018 1537   CALCIUM 10.1 04/20/2018 1537   GFRNONAA 59 (L) 04/20/2018 1537   GFRAA >60 04/20/2018 1537   Lab Results  Component Value Date   HGBA1C 8.5 (H) 02/15/2018   HGBA1C 8.4 (H) 11/03/2017   HGBA1C 7.5 (H) 05/18/2015   HGBA1C 8.7 (H) 02/05/2013   HGBA1C 6.9 (H) 06/03/2012   No results found for: INSULIN CBC    Component Value Date/Time   WBC 13.1 (H) 04/20/2018 1537   RBC 5.26 (H) 04/20/2018 1537   HGB 13.5 04/20/2018 1537   HCT 42.4 04/20/2018 1537   PLT 348 04/20/2018 1537   MCV 80.6 04/20/2018 1537   MCH 25.7 (L) 04/20/2018 1537   MCHC 31.8 04/20/2018 1537   RDW 14.6 04/20/2018 1537   LYMPHSABS 1.3 05/11/2017 1323   MONOABS 0.6 05/11/2017 1323   EOSABS 0.2 05/11/2017 1323   BASOSABS 0.0 05/11/2017 1323   Iron/TIBC/Ferritin/ %Sat    Component Value Date/Time   IRON 114 11/01/2015 1031   TIBC 372 11/01/2015 1031   FERRITIN 35 11/01/2015 1031   IRONPCTSAT 31 11/01/2015 1031   Lipid Panel     Component Value Date/Time   CHOL 169 02/15/2018 1034   TRIG 168 (H) 02/15/2018 1034   HDL 41 02/15/2018 1034  CHOLHDL 6.0 10/17/2011 0315   VLDL 59 (H) 10/17/2011 0315   LDLCALC 94 02/15/2018 1034   Hepatic Function Panel     Component Value Date/Time   PROT 8.0 04/20/2018 1537   PROT 6.8 11/03/2017 1310   ALBUMIN 3.7 04/20/2018 1537   ALBUMIN 3.8 11/03/2017 1310   AST 18 04/20/2018 1537   ALT 14 04/20/2018 1537   ALKPHOS 60 04/20/2018 1537   BILITOT 0.8 04/20/2018 1537   BILITOT 0.3 11/03/2017 1310   BILIDIR <0.1 06/03/2012 0220   IBILI NOT CALCULATED 06/03/2012 0220      Component Value Date/Time   TSH 0.947 11/03/2017 1310   TSH 2.020 09/08/2017 1129   TSH 1.045 06/03/2012 0520     Ref. Range 02/15/2018 10:34   Vitamin D, 25-Hydroxy Latest Ref Range: 30.0 - 100.0 ng/mL 26.5 (L)    I, Doreene Nest, am acting as Location manager for [employer]. Owens Shark, DO  I have reviewed the above documentation for accuracy and completeness, and I agree with the above. -Jearld Lesch, DO

## 2018-07-27 ENCOUNTER — Ambulatory Visit (INDEPENDENT_AMBULATORY_CARE_PROVIDER_SITE_OTHER): Payer: Federal, State, Local not specified - PPO | Admitting: Psychology

## 2018-07-27 ENCOUNTER — Other Ambulatory Visit: Payer: Self-pay

## 2018-07-27 DIAGNOSIS — F330 Major depressive disorder, recurrent, mild: Secondary | ICD-10-CM

## 2018-07-27 NOTE — Progress Notes (Signed)
Office: [phone]  /  Fax: [phone]    Date: July 27, 2018    [name] Start Time: 10:05am Duration: 28 minutes Provider: Glennie Isle, Psy.D. Type of Session: Individual Therapy  Location of Patient: Home Location of Provider: Provider's Home Type of Contact: Telepsychological Visit via [employer] WebEx & Telephone Call  Session Content: Tina [name] is a 48 y.o. female presenting via [employer] for a follow-up [name] to address the previously established treatment goal of decreasing emotional eating. Of note, this provider called Tina [name] at 10:02am as she did not present for today's [name]. She shared she deleted the e-mail; therefore, the e-mail with the secure link for today's [name] was re-sent. As such, today's [name] was initiated 5 minutes late. Due to connection issues, today's [name] was switched to a phone call [name] at 10:10am. She was informed only a landline to landline is a secure form of connection. She verbally acknowledged understanding and provided verbal consent to proceed with today's [name] via telephone despite it being cell phone to cell phone. Today's [name] was a telepsychological visit, as this provider's clinic is seeing a limited number of patients for in-person visits due to COVID-19. Therapeutic services will resume to in-person appointments once deemed appropriate. Tina [name] expressed understanding regarding the rationale for telepsychological services, and provided verbal consent for today's [name]. Prior to proceeding with today's [name], Tina [name]'s physical location at the time of this [name] was obtained. Tina [name] reported she was at home and provided the address. In the event of technical difficulties, Tina [name] shared a phone number she could be reached at. Tina [name] and this provider participated in today's telepsychological service. Also, Tina [name] denied anyone else being present in the room or on the WebEx  [name]/call.  This provider conducted a brief check-in and verbally administered the PHQ-9 and GAD-7. Tina [name] discussed she is now eating breakfast and walking every morning. She also noted an increase in protein intake since the last [name] with this provider. Tina [name] also shared her "mom cave" is almost complete. Additionally, she denied episodes of emotional eating since the last [name] and noted using learned skills when experiencing emotional hunger. Chart review revealed Tina [name] is scheduled for an initial [name] with a new provider per the referral placed by this provider on 08/11/2018. Tina [name] of today's [name] focused on mindfulness. She discussed engaging in the exercise involving her senses, which has helped with emotional hunger. This provider discussed the utilization of YouTube for mindfulness exercises, specifically videos by Tina [name]. During today's [name], she was led through a mindfulness exercise involving her breath. She described it as "very relaxing." As such, this provider discussed the purpose of mindfulness is not relaxation, but can be a pleasant side effect. This provider further discussed using the breath as an anchor. Tina [name] for this provider to send the handout for the exercise via e-mail. Tina [name] was receptive to today's session as evidenced by openness to sharing, responsiveness to feedback, and engagement in mindfulnes.  Mental Status Examination:  Appearance: neat Behavior: cooperative Mood: euthymic Affect: mood congruent Speech: normal in rate, volume, and tone Eye Contact: appropriate Psychomotor Activity: appropriate Thought Process: linear, logical, and goal directed  Content/Perceptual Disturbances: denies suicidal and homicidal ideation, plan, and intent and no hallucinations, delusions, bizarre thinking or behavior reported or observed Orientation: time, person, place and  purpose of [name] Cognition/Sensorium: memory, attention, language, and fund of knowledge intact  Insight: good Judgment: good  Structured Assessment Results: The Patient Health Questionnaire-9 (PHQ-9) is a self-report  measure that assesses symptoms and severity of depression over the course of the last two weeks. [name] obtained a score of 0. Little interest or pleasure in doing things 0  Feeling down, depressed, or hopeless 0  Trouble falling or staying asleep, or sleeping too much 0  Feeling tired or having little energy 0  Poor appetite or overeating 0  Feeling bad about yourself --- or that you are a failure or have let yourself or your family down 0  Trouble concentrating on things, such as reading the newspaper or watching television 0  Moving or speaking so slowly that other people could have noticed? Or the opposite --- being so fidgety or restless that you have been moving around a lot more than usual 0  Thoughts that you would be better off dead or hurting yourself in some way 0  PHQ-9 Score 0    The Generalized Anxiety Disorder-7 (GAD-7) is a brief self-report measure that assesses symptoms of anxiety over the course of the last two weeks. [name] obtained a score of 1 suggesting minimal anxiety. [name] finds the endorsed symptoms to be not difficult at all. Feeling nervous, anxious, on edge 0  Not being able to stop or control worrying 0  Worrying too much about different things 1  Trouble relaxing 0  Being so restless that it's hard to sit still 0  Becoming easily annoyed or irritable 0  Feeling afraid as if something awful might happen 0  GAD-7 Score 1   Interventions:  Conducted a brief chart review Verbal administration of PHQ-9 and GAD-7 for symptom monitoring Provided empathic reflections and validation Reviewed content from the previous session Engaged patient in a mindfulness exercise Employed supportive psychotherapy interventions to facilitate reduced  distress, and to improve coping skills with identified stressors Employed acceptance and commitment interventions to emphasize mindfulness and acceptance without struggle  DSM-5 Diagnosis: 296.31 (F33.0) Major Depressive Disorder, Recurrent Episode, Mild, With Anxious Distress  Treatment Goal & Progress: During the initial [name] with this provider, the following treatment goal was established: decrease emotional eating. Tina [name] has demonstrated progress in her goal as evidenced by increased awareness of hunger patterns and triggers for emotional eating. She also denied episodes of emotional eating since the last [name] with this provider, and she continues to engage in learned skills.   Plan: Tina [name] continues to appear able and willing to participate as evidenced by engagement in reciprocal conversation, and asking questions for clarification as appropriate. The next [name] will be scheduled in three weeks, which will be via [employer]. Once this provider's office resumes in-person appointments and it is deemed appropriate, Tina [name] will be notified. The next session will focus on reviewing learned skills, and possible termination.

## 2018-07-30 ENCOUNTER — Other Ambulatory Visit: Payer: Self-pay

## 2018-07-30 NOTE — Patient Outreach (Signed)
[facility]) Care Management Chronic Special Needs Program  07/30/2018  Name: Tina [name] DOB: 1970/11/27  MRN: [id number]  Tina [name] is enrolled in a chronic special needs plan for Diabetes. Reviewed and updated care plan.  Subjective: Client states that she is coping with her stress much better now.  States she is working with her counselor every 2 weeks which has helped.  States she is back to following her low CHO more protein diet.  States she is walking 2 miles most days.  States her blood sugars range from 120-200 and she has not had any low readings recently.  Denies any falls in the last 3 months.  States she still has not had her Advanced Directives forms notarized yet.  Goals Addressed            This Visit's Progress   . Advanced Care Planning complete by the next 6 months   On track    Plan to take completed  Advanced Directives to be notarized     . Client understands the importance of follow-up with providers by attending scheduled visits   On track   . Client will not report change from baseline and no repeated symptoms of stroke with in the next 3 months( continue 07/30/18)   On track   . COMPLETED: Client will report improved coping with diabetes and weight by  the next 6 months       [facility] worker will contact you to discuss stress     . Client will report no fall or injuries in the next 3 months.(continue 07/30/18   On track    Reports no falls Get up slowly    . Client will use Assistive Devices as needed and verbalize understanding of device use   On track   . Client will verbalize knowledge of self management of Hypertension as evidences by BP reading of 140/90 or less; or as defined by provider   On track   . Diabetes Patient stated goal to lose 10 lbs in the next 3 months( continue goal 07/30/18) (pt-stated)   On track    Continue to follow low carbohydrate diet as advised from the [facility] Healthy Weight and  Wellness clinic     . Maintain timely refills of diabetic medication as prescribed within the year .   On track   . COMPLETED: Obtain annual  Lipid Profile, LDL-C       Completed 02/15/18    . Obtain Annual Eye (retinal)  Exam    On track   . Obtain Annual Foot Exam   On track   . Obtain annual screen for micro albuminuria (urine) , nephropathy (kidney problems)   On track   . Obtain Hemoglobin A1C at least 2 times per year   On track   . Visit Primary Care Provider or Endocrinologist at least 2 times per year    On track    Client is not meeting diabetes self management goal of hemoglobin A1C of <7% with last reading of 7.8% per client report.  Client is being followed regularly by the healthy weight and wellness clinic.  Reports improved mood and coping.   Encouraged to continue to follow her eating plan from the Healthy Weight and Wellness clinic Reinforced to get Advanced Directives forms notarized.  Encouraged to continue walking daily Reviewed number for 24 hour nurse Line Reinforced  COVID19 cause, symptoms, precautions (social distancing, stay at home order, hand washing),  confirmed client knows how to contact provider.  Plan:  Send successful outreach letter with a copy of their individualized care plan and Send individual care plan to provider  Chronic care management coordinator will outreach in:  3 Months     Peter Garter RN, [employer], [place] Management Coordinator [place] [phone]

## 2018-08-03 ENCOUNTER — Telehealth (INDEPENDENT_AMBULATORY_CARE_PROVIDER_SITE_OTHER): Payer: HMO | Admitting: Bariatrics

## 2018-08-03 ENCOUNTER — Encounter (INDEPENDENT_AMBULATORY_CARE_PROVIDER_SITE_OTHER): Payer: Self-pay | Admitting: Bariatrics

## 2018-08-03 ENCOUNTER — Other Ambulatory Visit: Payer: Self-pay

## 2018-08-03 DIAGNOSIS — E1165 Type 2 diabetes mellitus with hyperglycemia: Secondary | ICD-10-CM | POA: Diagnosis not present

## 2018-08-03 DIAGNOSIS — Z6835 Body mass index (BMI) 35.0-35.9, adult: Secondary | ICD-10-CM

## 2018-08-03 DIAGNOSIS — E559 Vitamin D deficiency, unspecified: Secondary | ICD-10-CM | POA: Diagnosis not present

## 2018-08-04 NOTE — Progress Notes (Signed)
Office: [phone]  /  Fax: [phone] TeleHealth Visit:  Tina [name] has verbally consented to this TeleHealth visit today. The patient is located at home, the provider is located at the [employer] and Wellness office. The participants in this visit include the listed provider and patient. The visit was conducted today via webex.  HPI:   Chief Complaint: OBESITY [name] is here to discuss her progress with her obesity treatment plan. She is on the Category 3 plan and is following her eating plan approximately 75 % of the time. She states she is walking 1 1/2-2 miles 7 times per week. [name] states that her weight remains the same. She is taking more Gabapentin due to neuropathy pain. She is struggling with getting adequate protein.  We were unable to weigh the patient today for this TeleHealth visit. She feels as if she has maintained her weight since her last visit. She has lost 9 lbs since starting treatment with [name].  Vitamin D Deficiency Tina [name] has a diagnosis of vitamin D deficiency. She is currently taking prescription Vit D and denies nausea, vomiting or muscle weakness.  Diabetes II Tina [name] has a diagnosis of diabetes type II. Retina states fasting BGs range at 111 today, occasionally ranging in 140's. She is taking Novolog, Victoza, and Engineer, agricultural. She denies hypoglycemia. Last A1c was 8.5. She has been working on intensive lifestyle modifications including diet, exercise, and weight loss to help control her blood glucose levels.  ASSESSMENT AND PLAN:  Uncontrolled type 2 diabetes mellitus with hyperglycemia (HCC)  Vitamin D deficiency - Plan: Vitamin D, Ergocalciferol, (DRISDOL) 1.25 MG (50000 UT) CAPS capsule  Class 2 severe obesity with serious comorbidity and body mass index (BMI) of 35.0 to 35.9 in adult, unspecified obesity type (HCC)  PLAN:  Vitamin D Deficiency [name] was informed that low vitamin D levels contributes to fatigue and are associated with  obesity, breast, and colon cancer. [name] agrees to continue taking prescription Vit D 50,000 IU every week #4 and we will refill for 1 month. She will follow up for routine testing of vitamin D, at least 2-3 times per year. She was informed of the risk of over-replacement of vitamin D and agrees to not increase her dose unless she discusses this with [name] first. [name] agrees to follow up with our clinic in 2 weeks.  Diabetes II Tina [name] has been given extensive diabetes education by myself today including ideal fasting and post-prandial blood glucose readings, individual ideal Hgb A1c goals and hypoglycemia prevention. We discussed the importance of good blood sugar control to decrease the likelihood of diabetic complications such as nephropathy, neuropathy, limb loss, blindness, coronary artery disease, and death. We discussed the importance of intensive lifestyle modification including diet, exercise and weight loss as the first line treatment for diabetes. Tina [name] agrees to continue her diabetes medications and she agrees to follow up with our clinic in 2 weeks.  Obesity Tina [name] is currently in the action stage of change. As such, her goal is to continue with weight loss efforts She has agreed to follow the Category 3 plan [name] has been instructed to work up to a goal of 150 minutes of combined cardio and strengthening exercise per week or continue current activities for weight loss and overall health benefits. We discussed the following Behavioral Modification Strategies today: increasing lean protein intake, decreasing simple carbohydrates, increasing vegetables, decrease eating out, increase H20 intake, work on meal planning and easy cooking plans, no skipping meals, and keeping healthy foods  in the home   Tina [name] has agreed to follow up with our clinic in 2 weeks. She was informed of the importance of frequent follow up visits to maximize her success with intensive lifestyle modifications for  her multiple health conditions.  ALLERGIES: Allergies  Allergen Reactions  . Contrast Media [Iodinated Diagnostic Agents] Other (See Comments)    Difficulty breathing  . Iohexol Hives, Nausea And Vomiting and Swelling     Desc: Magnevist-gadolinium-difficulty breathing, throat swelling   . Midazolam Hcl Anaphylaxis    Difficulty breathing  . Shellfish Allergy Anaphylaxis  . Valsartan Swelling  . Metformin And Related Diarrhea and Nausea And Vomiting  . Other Itching    Patient is allergic to all nuts except peanuts.   . Avandia [Rosiglitazone Maleate] Hives and Other (See Comments)  . Geodon [Ziprasidone] Other (See Comments)    UNKNOWN  . Kiwi Extract Itching and Swelling  . Latex Itching    MEDICATIONS: Current Outpatient Medications on File Prior to Visit  Medication Sig Dispense Refill  . allopurinol (ZYLOPRIM) 100 MG tablet Take 100 mg by mouth every evening.    Marland Kitchen amLODipine (NORVASC) 10 MG tablet TAKE 1 TABLET BY MOUTH EVERY DAY 90 tablet 1  . aspirin 325 MG tablet Take 325 mg by mouth at bedtime.     Marland Kitchen atorvastatin (LIPITOR) 10 MG tablet Take 1 tablet (10 mg total) by mouth daily. 30 tablet 0  . cetirizine (ZYRTEC) 10 MG tablet Take 10 mg by mouth daily as needed for allergies.   11  . cloNIDine (CATAPRES) 0.1 MG tablet Take 0.1 mg by mouth 2 (two) times daily.  0  . colchicine 0.6 MG tablet Take 0.6 mg by mouth daily as needed (For gout flare-up.).   1  . EPINEPHrine 0.3 mg/0.3 mL IJ SOAJ injection INJECT AS DIRECTED AS NEEDED FOR ALLERGIC REACTION 1 Device 2  . fluticasone (FLONASE) 50 MCG/ACT nasal spray daily as needed.     . gabapentin (NEURONTIN) 600 MG tablet Take 600 mg by mouth 3 (three) times daily.     . hydrochlorothiazide (HYDRODIURIL) 50 MG tablet Take 50 mg by mouth daily.     . insulin aspart (NOVOLOG) 100 UNIT/ML injection Inject 5-15 Units into the skin 3 (three) times daily with meals. Per sliding scale--pt uses Omnipod and gets a basal rate     .  Insulin Glargine (BASAGLAR KWIKPEN) 100 UNIT/ML SOPN Inject 18 Units into the skin daily.     . Liraglutide (VICTOZA) 18 MG/3ML SOPN Inject 1.2 mg into the skin daily.    Marland Kitchen omeprazole (PRILOSEC) 40 MG capsule TAKE 1 CAPSULE BY MOUTH EVERY DAY 90 capsule 1  . ondansetron (ZOFRAN-ODT) 4 MG disintegrating tablet Take 1 tablet (4 mg total) by mouth every 8 (eight) hours as needed for nausea or vomiting. Dissolve under tongue 90 tablet 1  . propranolol (INDERAL) 20 MG tablet Take 20 mg by mouth daily.    Marland Kitchen spironolactone (ALDACTONE) 50 MG tablet Take 1 tablet (50 mg total) by mouth daily. 90 tablet 3  . SUPREP BOWEL PREP KIT 17.5-3.13-1.6 GM/177ML SOLN Suprep-Use as directed 354 mL 0  . tiZANidine (ZANAFLEX) 2 MG tablet TAKE 1 TABLET AT BEDTIME DAILY. CAN TAKE 1/2 TABLET IN THE AM AS NEEDED (Patient taking differently: Take 1-2 mg by mouth See admin instructions. Take 1m at bedtime daily. Can take 149min the morning as needed for muscle spasms.) 135 tablet 1  . traMADol (ULTRAM) 50 MG tablet Take 1 tablet (  50 mg total) by mouth 3 (three) times daily. BID (may take TID as needed) 90 tablet 5  . Vitamin D, Ergocalciferol, (DRISDOL) 1.25 MG (50000 UT) CAPS capsule Take 1 capsule (50,000 Units total) by mouth every 7 (seven) days. 4 capsule 0   No current facility-administered medications on file prior to visit.     PAST MEDICAL HISTORY: Past Medical History:  Diagnosis Date  . Allergy   . Anemia    DURING MENSES--HAS HEAVY BLEEDING WITH PERODS  . Anxiety   . Back pain, chronic    "ongoing"  . Blood transfusion    IN 2012  AFTER C -SECTION  . Cerebral thrombosis with cerebral infarction [facility]) JUNE 2011   RIGHT SIDED WEAKNESS ( ARM AND LEG ) AND SPASMS-remains with slight weakness and vertigo.  . Constipation   . Depression   . Diabetes mellitus   . Diabetic neuropathy ([place])    BOTH FEET --COMES AND GOES  . Edema, lower extremity   . Endometriosis   . Fatty liver   . GERD (gastroesophageal  reflux disease)    with pregnancy  . H/O eye surgery   . Headache(784.0)    MIGRAINES--NOT REALLY HEADACHE-MORE LIKE PRESSURE SENSATION IN HEAD-FEELS DIZZIY AND  FAINT AS THE PRESSURE RESOLVES  . Hernia, incisional, RLQ, s/p lap repair Sep 2013 09/02/2011  . History of vertigo 03/22/2018  . Hx of migraines 10/19/2011  . Hypertension   . Leg pain, right    "like bad Charley horse"  . Multiple food allergies   . Panic disorder without agoraphobia   . Rash    HANDS, ARMS --STATES HX OF RASH EVER SINCE CHILDBIRTH/PREGNANCY.  STATES THE RASH OFTEN OCCURS WHEN SHE IS REALLY STRESSED."goes and comes-presently left ring finger"  . Restless leg syndrome    DIAGNOSED BY SLEEP STUDY - PT TOLD SHE DID NOT HAVE SLEEP APNEA  . Right rotator cuff tear    PAIN IN RIGHT SHOULDER  . SBO (small bowel obstruction) ([place]) 06/03/2012  . Shortness of breath   . Spastic hemiplegia affecting dominant side ([place])   . Stomach problems   . Stroke ([place])   . Ventral hernia    RIGHT LOWER QUADRANT-CAUSING SOME PAIN  . Weakness of right side of body     PAST SURGICAL HISTORY: Past Surgical History:  Procedure Laterality Date  . APPLICATION OF WOUND VAC N/A 05/25/2015   Procedure: APPLICATION OF WOUND VAC;  Surgeon: Michael Boston, MD;  Location: [facility];  Service: General;  Laterality: N/A;  . CESAREAN SECTION  2012  . COLONOSCOPY    . DIAGNOSTIC LAPAROSCOPY    . ESOPHAGOGASTRODUODENOSCOPY N/A 06/03/2012   Procedure: ESOPHAGOGASTRODUODENOSCOPY (EGD);  Surgeon: [name] Craver, MD;  Location[facility] ENDOSCOPY;  Service: Endoscopy;  Laterality: N/A;  . EXCISION MASS ABDOMINAL N/A 05/25/2015   Procedure: ABDOMINAL WALL EXPLORATION EXCISION OF SEROMA REMOVAL OF REDUNDANT SKIN ;  Surgeon: Michael Boston, MD;  Location: [facility];  Service: General;  Laterality: N/A;  . EYE SURGERY     Eye laser for vessel hemorrhaging  . fybroid removal    . HERNIA REPAIR  10/03/11   ventral hernia repair  . INSERTION OF MESH N/A 02/04/2013    Procedure: INSERTION OF MESH;  Surgeon: Adin Hector, MD;  Location: [facility];  Service: General;  Laterality: N/A;  . UMBILICAL HERNIA REPAIR N/A 02/04/2013   Procedure: LAPAROSCOPIC ventral wall hernia repair LAPAROSCOPIC LYSIS OF ADHESIONS laparoscopic exploration of abdomen ;  Surgeon: Adin Hector,  MD;  Location: [facility];  Service: General;  Laterality: N/A;  . UPPER GASTROINTESTINAL ENDOSCOPY    . URETER REVISION     Bilateral "twisted"  . UTERINE FIBROID SURGERY     2 SURGERIES FOR FIBROIDS  . VENTRAL HERNIA REPAIR  10/03/2011   Procedure: LAPAROSCOPIC VENTRAL HERNIA;  Surgeon: Adin Hector, MD;  Location: [facility];  Service: General;  Laterality: N/A;    SOCIAL HISTORY: Social History   Tobacco Use  . Smoking status: Never Smoker  . Smokeless tobacco: Never Used  Substance Use Topics  . Alcohol use: No    Alcohol/week: 0.0 standard drinks  . Drug use: No    FAMILY HISTORY: Family History  Problem Relation Age of Onset  . Diabetes Father   . Kidney disease Father   . Depression Father   . Drug abuse Father   . Allergic rhinitis Mother   . Eczema Mother   . Urticaria Mother   . Depression Mother   . Anxiety disorder Mother   . Bipolar disorder Mother   . Alcoholism Mother   . Drug abuse Mother   . Eating disorder Mother   . Diabetes Maternal Grandmother   . Hyperlipidemia Paternal Grandmother   . Stroke Paternal Grandmother   . Eczema Sister   . Urticaria Sister   . Colon cancer Paternal Uncle   . Other Neg Hx   . Angioedema Neg Hx   . Asthma Neg Hx   . Colon polyps Neg Hx   . Esophageal cancer Neg Hx   . Rectal cancer Neg Hx   . Stomach cancer Neg Hx     ROS: Review of Systems  Constitutional: Negative for weight loss.  Gastrointestinal: Negative for nausea and vomiting.  Musculoskeletal:       Negative muscle weakness  Endo/Heme/Allergies:       Negative hypoglycemia    PHYSICAL EXAM: Pt in no acute distress  RECENT LABS AND TESTS: BMET     Component Value Date/Time   NA 137 04/20/2018 1537   NA 141 11/03/2017 1310   K 3.6 04/20/2018 1537   CL 99 04/20/2018 1537   CO2 25 04/20/2018 1537   GLUCOSE 240 (H) 04/20/2018 1537   BUN 22 (H) 04/20/2018 1537   BUN 11 11/03/2017 1310   CREATININE 1.11 (H) 04/20/2018 1537   CALCIUM 10.1 04/20/2018 1537   GFRNONAA 59 (L) 04/20/2018 1537   GFRAA >60 04/20/2018 1537   Lab Results  Component Value Date   HGBA1C 8.5 (H) 02/15/2018   HGBA1C 8.4 (H) 11/03/2017   HGBA1C 7.5 (H) 05/18/2015   HGBA1C 8.7 (H) 02/05/2013   HGBA1C 6.9 (H) 06/03/2012   No results found for: INSULIN CBC    Component Value Date/Time   WBC 13.1 (H) 04/20/2018 1537   RBC 5.26 (H) 04/20/2018 1537   HGB 13.5 04/20/2018 1537   HCT 42.4 04/20/2018 1537   PLT 348 04/20/2018 1537   MCV 80.6 04/20/2018 1537   MCH 25.7 (L) 04/20/2018 1537   MCHC 31.8 04/20/2018 1537   RDW 14.6 04/20/2018 1537   LYMPHSABS 1.3 05/11/2017 1323   MONOABS 0.6 05/11/2017 1323   EOSABS 0.2 05/11/2017 1323   BASOSABS 0.0 05/11/2017 1323   Iron/TIBC/Ferritin/ %Sat    Component Value Date/Time   IRON 114 11/01/2015 1031   TIBC 372 11/01/2015 1031   FERRITIN 35 11/01/2015 1031   IRONPCTSAT 31 11/01/2015 1031   Lipid Panel     Component Value Date/Time  CHOL 169 02/15/2018 1034   TRIG 168 (H) 02/15/2018 1034   HDL 41 02/15/2018 1034   CHOLHDL 6.0 10/17/2011 0315   VLDL 59 (H) 10/17/2011 0315   LDLCALC 94 02/15/2018 1034   Hepatic Function Panel     Component Value Date/Time   PROT 8.0 04/20/2018 1537   PROT 6.8 11/03/2017 1310   ALBUMIN 3.7 04/20/2018 1537   ALBUMIN 3.8 11/03/2017 1310   AST 18 04/20/2018 1537   ALT 14 04/20/2018 1537   ALKPHOS 60 04/20/2018 1537   BILITOT 0.8 04/20/2018 1537   BILITOT 0.3 11/03/2017 1310   BILIDIR <0.1 06/03/2012 0220   IBILI NOT CALCULATED 06/03/2012 0220      Component Value Date/Time   TSH 0.947 11/03/2017 1310   TSH 2.020 09/08/2017 1129   TSH 1.045 06/03/2012 0520       I, Trixie Dredge, am acting as transcriptionist for [employer], DO  I have reviewed the above documentation for accuracy and completeness, and I agree with the above. Jearld Lesch, DO

## 2018-08-09 MED ORDER — VITAMIN D (ERGOCALCIFEROL) 1.25 MG (50000 UNIT) PO CAPS: 50000.0000 [IU] | ORAL_CAPSULE | ORAL | 0 refills | Status: DC

## 2018-08-11 ENCOUNTER — Ambulatory Visit: Payer: Self-pay | Admitting: Psychology

## 2018-08-11 NOTE — Progress Notes (Signed)
Office: [phone]  /  Fax: [phone]    Date: August 17, 2018   Appointment Start Time: 10:03am Duration: 22 minutes Provider: Glennie Isle, Psy.D. Type of Session: Individual Therapy  Location of Patient: Home Location of Provider: [facility] Type of Contact: Telepsychological Visit via [employer] WebEx   Session Content: Tina [name] is a 48 y.o. female presenting via [employer] for a follow-up appointment to address the previously established treatment goal of decreasing emotional eating. Today's appointment was a telepsychological visit, as this provider's clinic is seeing a limited number of patients for in-person visits due to COVID-19. Therapeutic services will resume to in-person appointments once deemed appropriate. Tina [name] expressed understanding regarding the rationale for telepsychological services, and provided verbal consent for today's appointment. Prior to proceeding with today's appointment, Tina [name]'s physical location at the time of this appointment was obtained. Tina [name] reported she was at home and provided the address. In the event of technical difficulties, Tina [name] shared a phone number she could be reached at. Tina [name] and this provider participated in today's telepsychological service. Also, Tina [name] denied anyone else being present in the room or on the WebEx appointment. Of note, Tina [name] presented 3 minutes late for today's appointment.  This provider conducted a brief check-in and verbally administered the PHQ-9 and GAD-7. Tina [name] shared the provider never presented for her appointment based on the referral placed by this provider. As such, a referral was placed again with [name]'s verbal consent. In addition, Tina [name] shared she continues to walk. A risk assessment was completed. Tina [name] denied experiencing suicidal and homicidal ideation, plan, and intent since the last appointment with this provider. She continues to acknowledge understanding regarding  the importance of reaching out to trusted individuals and/or emergency resources if she is unable to ensure safety.   Regarding eating, Tina [name] shared, "I did have a weak moment where I had a baguette." She reported she also likes to "pick, pick, pick;" therefore, she described engaging in meal prep to assist with snacking behaviors. This was positively reinforced. Since the onset of treatment with this provider, Tina [name] shared emotional eating is "going better." She added, "I've been busy." In regard to mindfulness, Tina [name] disclosed, "I haven't really had the chance to sit down and go over it." She reported a plan to review the handout. During today's appointment, she was led through a mindfulness exercise, "A Taste of Mindfulness." Following the exercise, Tina [name]'s experience was processed. She described the exercise as "relaxing." As such, this provider discussed how relaxation is not the goal of mindfulness, rather a pleasant side effect. Additionally, she discussed being able to re-direct herself when her mind wandered. She was reminded to utilize YouTube as well for mindfulness videos. She noted a plan to utilize today's exercise prior to eating. Tina [name] was receptive to today's session as evidenced by openness to sharing, responsiveness to feedback, and willingness to engage in mindfulness exercises.  Mental Status Examination:  Appearance: neat Behavior: cooperative Mood: euthymic Affect: mood congruent Speech: normal in rate, volume, and tone Eye Contact: appropriate Psychomotor Activity: appropriate Thought Process: linear, logical, and goal directed  Content/Perceptual Disturbances: denies suicidal and homicidal ideation, plan, and intent and no hallucinations, delusions, bizarre thinking or behavior reported or observed Orientation: time, person, place and purpose of appointment Cognition/Sensorium: memory, attention, language, and fund of knowledge intact  Insight: good Judgment: good   Structured Assessment Results: The Patient Health Questionnaire-9 (PHQ-9) is a self-report measure that assesses symptoms and severity of depression over the course of the last  two weeks. Tina [name] obtained a score of 1 suggesting minimal depression. Tina [name] finds the endorsed symptoms to be somewhat difficult. Little interest or pleasure in doing things 0  Feeling down, depressed, or hopeless 0  Trouble falling or staying asleep, or sleeping too much 1  Feeling tired or having little energy 0  Poor appetite or overeating 0  Feeling bad about yourself --- or that you are a failure or have let yourself or your family down 0  Trouble concentrating on things, such as reading the newspaper or watching television 0  Moving or speaking so slowly that other people could have noticed? Or the opposite --- being so fidgety or restless that you have been moving around a lot more than usual 0  Thoughts that you would be better off dead or hurting yourself in some way 0  PHQ-9 Score 1    The Generalized Anxiety Disorder-7 (GAD-7) is a brief self-report measure that assesses symptoms of anxiety over the course of the last two weeks. Tina [name] obtained a score of 0. Feeling nervous, anxious, on edge 0  Not being able to stop or control worrying 0  Worrying too much about different things 0  Trouble relaxing 0  Being so restless that it's hard to sit still 0  Becoming easily annoyed or irritable 0  Feeling afraid as if something awful might happen 0  GAD-7 Score 0   Interventions:  Conducted a brief chart review Verbal administration of PHQ-9 and GAD-7 for symptom monitoring Provided empathic reflections and validation Reviewed content from the previous session Engaged patient in a mindfulness exercise Provided positive reinforcement Employed supportive psychotherapy interventions to facilitate reduced distress, and to improve coping skills with identified stressors Employed acceptance and commitment  interventions to emphasize mindfulness and acceptance without struggle  DSM-5 Diagnosis: 296.31 (F33.0) Major Depressive Disorder, Recurrent Episode, Mild, With Anxious Distress  Treatment Goal & Progress: During the initial appointment with this provider, the following treatment goal was established: decrease emotional eating. Cassidee has demonstrated progress in her goal as evidenced by increased awareness of hunger patterns and triggers for emotional eating. Since the onset of treatment, she noted a reduction in emotional eating and continues to demonstrate willingness to engage in learned skills.  Plan: Tenley continues to appear able and willing to participate as evidenced by engagement in reciprocal conversation, and asking questions for clarification as appropriate. Per Anny's request, a follow-up appointment will be scheduled as she was not established with a new provider. The next appointment will be scheduled in three weeks, which will be via [employer]. Once this provider's office resumes in-person appointments and it is deemed appropriate, Allexa will be notified. The next session will focus on reviewing learned skills.

## 2018-08-17 ENCOUNTER — Other Ambulatory Visit: Payer: Self-pay

## 2018-08-17 ENCOUNTER — Encounter (INDEPENDENT_AMBULATORY_CARE_PROVIDER_SITE_OTHER): Payer: Self-pay | Admitting: Bariatrics

## 2018-08-17 ENCOUNTER — Telehealth (INDEPENDENT_AMBULATORY_CARE_PROVIDER_SITE_OTHER): Payer: HMO | Admitting: Bariatrics

## 2018-08-17 ENCOUNTER — Ambulatory Visit (INDEPENDENT_AMBULATORY_CARE_PROVIDER_SITE_OTHER): Payer: HMO | Admitting: Psychology

## 2018-08-17 DIAGNOSIS — Z6835 Body mass index (BMI) 35.0-35.9, adult: Secondary | ICD-10-CM | POA: Diagnosis not present

## 2018-08-17 DIAGNOSIS — F330 Major depressive disorder, recurrent, mild: Secondary | ICD-10-CM

## 2018-08-17 DIAGNOSIS — E559 Vitamin D deficiency, unspecified: Secondary | ICD-10-CM

## 2018-08-17 DIAGNOSIS — E1165 Type 2 diabetes mellitus with hyperglycemia: Secondary | ICD-10-CM | POA: Diagnosis not present

## 2018-08-17 NOTE — Progress Notes (Signed)
Office: [phone]  /  Fax: [phone] TeleHealth Visit:  KEISA BLOW has verbally consented to this TeleHealth visit today. The patient is located at home, the provider is located at the [employer] [facility] office. The participants in this visit include the listed provider and patient. The visit was conducted today via Webex.  HPI:   Chief Complaint: OBESITY Tina [name] is here to discuss her progress with her obesity treatment plan. She is on the Category 3 plan and is following her eating plan approximately 70% of the time. She states she is walking 2 miles 5 times per week. Elysa states that her weight is the same (230 lbs). She has had minimal struggles. She denies any stress eating and is eating more protein. We were unable to weigh the patient today for this TeleHealth visit. She feels as if she has maintained her weight since her last visit. She has lost 5 lbs since starting treatment with [name].  Uncontrolled Diabetes II with Hyperglycemia Tonji has a diagnosis of uncontrolled diabetes type II with hyperglycemia and states this is better controlled. Aayra does not report checking her blood sugars. Last A1c was 8.5 on 02/15/2018. She has been working on intensive lifestyle modifications including diet, exercise, and weight loss to help control her blood glucose levels.  Vitamin D deficiency Shalana has a diagnosis of Vitamin D deficiency. Her last Vitamin D level was 26.5 on 02/15/2018. She is currently taking Vit D and denies nausea, vomiting or muscle weakness.  ASSESSMENT AND PLAN:  Uncontrolled type 2 diabetes mellitus with hyperglycemia (HCC)  Vitamin D deficiency  Class 2 severe obesity with serious comorbidity and body mass index (BMI) of 35.0 to 35.9 in adult, unspecified obesity type ([place])  PLAN:  Uncontrolled Diabetes II with Hyperglycemia Cherylynn has been given extensive diabetes education by myself today including ideal fasting and post-prandial  blood glucose readings, individual ideal HgA1c goals  and hypoglycemia prevention. We discussed the importance of good blood sugar control to decrease the likelihood of diabetic complications such as nephropathy, neuropathy, limb loss, blindness, coronary artery disease, and death. We discussed the importance of intensive lifestyle modification including diet, exercise and weight loss as the first line treatment for diabetes. Meghann agrees to continue her diabetes medications and monitor her blood sugars more closely. She will follow-up at the agreed upon time.  Vitamin D Deficiency Yoshiko was informed that low Vitamin D levels contributes to fatigue and are associated with obesity, breast, and colon cancer. She agrees to continue taking Vit D and will follow-up for routine testing of Vitamin D, at least 2-3 times per year. She was informed of the risk of over-replacement of Vitamin D and agrees to not increase her dose unless she discusses this with [name] first. Arieanna agrees to follow-up with our clinic in 2 weeks.  Obesity Tyrianna is currently in the action stage of change. As such, her goal is to continue with weight loss efforts. She has agreed to follow the Category 3 plan.  Kimberleigh will sit down to eat and will stop snacking "picking". She will increase her water intake, increase her protein, and will get glucose tablets. Zenia has been instructed to continue her current exercise regimen for weight loss and overall health benefits. We discussed the following Behavioral Modification Strategies today: increasing lean protein intake, decreasing simple carbohydrates, increasing vegetables, increase H20 intake, decrease eating out, no skipping meals, work on meal planning and easy cooking plans, and keeping healthy foods in the home.  Kebrina has agreed to follow-up with our clinic in 2 weeks. She was informed of the importance of frequent follow-up visits to maximize her success with intensive  lifestyle modifications for her multiple health conditions.  ALLERGIES: Allergies  Allergen Reactions  . Contrast Media [Iodinated Diagnostic Agents] Other (See Comments)    Difficulty breathing  . Iohexol Hives, Nausea And Vomiting and Swelling     Desc: Magnevist-gadolinium-difficulty breathing, throat swelling   . Midazolam Hcl Anaphylaxis    Difficulty breathing  . Shellfish Allergy Anaphylaxis  . Valsartan Swelling  . Metformin And Related Diarrhea and Nausea And Vomiting  . Other Itching    Patient is allergic to all nuts except peanuts.   . Avandia [Rosiglitazone Maleate] Hives and Other (See Comments)  . Geodon [Ziprasidone] Other (See Comments)    UNKNOWN  . Kiwi Extract Itching and Swelling  . Latex Itching    MEDICATIONS: Current Outpatient Medications on File Prior to Visit  Medication Sig Dispense Refill  . allopurinol (ZYLOPRIM) 100 MG tablet Take 100 mg by mouth every evening.    Marland Kitchen amLODipine (NORVASC) 10 MG tablet TAKE 1 TABLET BY MOUTH EVERY DAY 90 tablet 1  . aspirin 325 MG tablet Take 325 mg by mouth at bedtime.     Marland Kitchen atorvastatin (LIPITOR) 10 MG tablet Take 1 tablet (10 mg total) by mouth daily. 30 tablet 0  . cetirizine (ZYRTEC) 10 MG tablet Take 10 mg by mouth daily as needed for allergies.   11  . cloNIDine (CATAPRES) 0.1 MG tablet Take 0.1 mg by mouth 2 (two) times daily.  0  . colchicine 0.6 MG tablet Take 0.6 mg by mouth daily as needed (For gout flare-up.).   1  . EPINEPHrine 0.3 mg/0.3 mL IJ SOAJ injection INJECT AS DIRECTED AS NEEDED FOR ALLERGIC REACTION 1 Device 2  . fluticasone (FLONASE) 50 MCG/ACT nasal spray daily as needed.     . gabapentin (NEURONTIN) 600 MG tablet Take 600 mg by mouth 3 (three) times daily.     . hydrochlorothiazide (HYDRODIURIL) 50 MG tablet Take 50 mg by mouth daily.     . insulin aspart (NOVOLOG) 100 UNIT/ML injection Inject 5-15 Units into the skin 3 (three) times daily with meals. Per sliding scale--pt uses Omnipod and  gets a basal rate     . Insulin Glargine (BASAGLAR KWIKPEN) 100 UNIT/ML SOPN Inject 18 Units into the skin daily.     . Liraglutide (VICTOZA) 18 MG/3ML SOPN Inject 1.2 mg into the skin daily.    Marland Kitchen omeprazole (PRILOSEC) 40 MG capsule TAKE 1 CAPSULE BY MOUTH EVERY DAY 90 capsule 1  . ondansetron (ZOFRAN-ODT) 4 MG disintegrating tablet Take 1 tablet (4 mg total) by mouth every 8 (eight) hours as needed for nausea or vomiting. Dissolve under tongue 90 tablet 1  . propranolol (INDERAL) 20 MG tablet Take 20 mg by mouth daily.    Marland Kitchen spironolactone (ALDACTONE) 50 MG tablet Take 1 tablet (50 mg total) by mouth daily. 90 tablet 3  . SUPREP BOWEL PREP KIT 17.5-3.13-1.6 GM/177ML SOLN Suprep-Use as directed 354 mL 0  . tiZANidine (ZANAFLEX) 2 MG tablet TAKE 1 TABLET AT BEDTIME DAILY. CAN TAKE 1/2 TABLET IN THE AM AS NEEDED (Patient taking differently: Take 1-2 mg by mouth See admin instructions. Take 62m at bedtime daily. Can take 124min the morning as needed for muscle spasms.) 135 tablet 1  . traMADol (ULTRAM) 50 MG tablet Take 1 tablet (50 mg total) by mouth 3 (three)  times daily. BID (may take TID as needed) 90 tablet 5  . Vitamin D, Ergocalciferol, (DRISDOL) 1.25 MG (50000 UT) CAPS capsule Take 1 capsule (50,000 Units total) by mouth every 7 (seven) days. 4 capsule 0   No current facility-administered medications on file prior to visit.     PAST MEDICAL HISTORY: Past Medical History:  Diagnosis Date  . Allergy   . Anemia    DURING MENSES--HAS HEAVY BLEEDING WITH PERODS  . Anxiety   . Back pain, chronic    "ongoing"  . Blood transfusion    IN 2012  AFTER C -SECTION  . Cerebral thrombosis with cerebral infarction [facility]) JUNE 2011   RIGHT SIDED WEAKNESS ( ARM AND LEG ) AND SPASMS-remains with slight weakness and vertigo.  . Constipation   . Depression   . Diabetes mellitus   . Diabetic neuropathy ([place])    BOTH FEET --COMES AND GOES  . Edema, lower extremity   . Endometriosis   . Fatty liver    . GERD (gastroesophageal reflux disease)    with pregnancy  . H/O eye surgery   . Headache(784.0)    MIGRAINES--NOT REALLY HEADACHE-MORE LIKE PRESSURE SENSATION IN HEAD-FEELS DIZZIY AND  FAINT AS THE PRESSURE RESOLVES  . Hernia, incisional, RLQ, s/p lap repair Sep 2013 09/02/2011  . History of vertigo 03/22/2018  . Hx of migraines 10/19/2011  . Hypertension   . Leg pain, right    "like bad Charley horse"  . Multiple food allergies   . Panic disorder without agoraphobia   . Rash    HANDS, ARMS --STATES HX OF RASH EVER SINCE CHILDBIRTH/PREGNANCY.  STATES THE RASH OFTEN OCCURS WHEN SHE IS REALLY STRESSED."goes and comes-presently left ring finger"  . Restless leg syndrome    DIAGNOSED BY SLEEP STUDY - PT TOLD SHE DID NOT HAVE SLEEP APNEA  . Right rotator cuff tear    PAIN IN RIGHT SHOULDER  . SBO (small bowel obstruction) ([place]) 06/03/2012  . Shortness of breath   . Spastic hemiplegia affecting dominant side ([place])   . Stomach problems   . Stroke ([place])   . Ventral hernia    RIGHT LOWER QUADRANT-CAUSING SOME PAIN  . Weakness of right side of body     PAST SURGICAL HISTORY: Past Surgical History:  Procedure Laterality Date  . APPLICATION OF WOUND VAC N/A 05/25/2015   Procedure: APPLICATION OF WOUND VAC;  Surgeon: Michael Boston, MD;  Location: [facility];  Service: General;  Laterality: N/A;  . CESAREAN SECTION  2012  . COLONOSCOPY    . DIAGNOSTIC LAPAROSCOPY    . ESOPHAGOGASTRODUODENOSCOPY N/A 06/03/2012   Procedure: ESOPHAGOGASTRODUODENOSCOPY (EGD);  Surgeon: [name] Craver, MD;  [facility] ENDOSCOPY;  Service: Endoscopy;  Laterality: N/A;  . EXCISION MASS ABDOMINAL N/A 05/25/2015   Procedure: ABDOMINAL WALL EXPLORATION EXCISION OF SEROMA REMOVAL OF REDUNDANT SKIN ;  Surgeon: Michael Boston, MD;  Location: [facility];  Service: General;  Laterality: N/A;  . EYE SURGERY     Eye laser for vessel hemorrhaging  . fybroid removal    . HERNIA REPAIR  10/03/11   ventral hernia repair  . INSERTION OF  MESH N/A 02/04/2013   Procedure: INSERTION OF MESH;  Surgeon: Adin Hector, MD;  Location: [facility];  Service: General;  Laterality: N/A;  . UMBILICAL HERNIA REPAIR N/A 02/04/2013   Procedure: LAPAROSCOPIC ventral wall hernia repair LAPAROSCOPIC LYSIS OF ADHESIONS laparoscopic exploration of abdomen ;  Surgeon: Adin Hector, MD;  Location: [facility];  Service:  General;  Laterality: N/A;  . UPPER GASTROINTESTINAL ENDOSCOPY    . URETER REVISION     Bilateral "twisted"  . UTERINE FIBROID SURGERY     2 SURGERIES FOR FIBROIDS  . VENTRAL HERNIA REPAIR  10/03/2011   Procedure: LAPAROSCOPIC VENTRAL HERNIA;  Surgeon: Adin Hector, MD;  Location: [facility];  Service: General;  Laterality: N/A;    SOCIAL HISTORY: Social History   Tobacco Use  . Smoking status: Never Smoker  . Smokeless tobacco: Never Used  Substance Use Topics  . Alcohol use: No    Alcohol/week: 0.0 standard drinks  . Drug use: No    FAMILY HISTORY: Family History  Problem Relation Age of Onset  . Diabetes Father   . Kidney disease Father   . Depression Father   . Drug abuse Father   . Allergic rhinitis Mother   . Eczema Mother   . Urticaria Mother   . Depression Mother   . Anxiety disorder Mother   . Bipolar disorder Mother   . Alcoholism Mother   . Drug abuse Mother   . Eating disorder Mother   . Diabetes Maternal Grandmother   . Hyperlipidemia Paternal Grandmother   . Stroke Paternal Grandmother   . Eczema Sister   . Urticaria Sister   . Colon cancer Paternal Uncle   . Other Neg Hx   . Angioedema Neg Hx   . Asthma Neg Hx   . Colon polyps Neg Hx   . Esophageal cancer Neg Hx   . Rectal cancer Neg Hx   . Stomach cancer Neg Hx    ROS: Review of Systems  Gastrointestinal: Negative for nausea and vomiting.  Musculoskeletal:       Negative for muscle weakness.   PHYSICAL EXAM: Pt in no acute distress  RECENT LABS AND TESTS: BMET    Component Value Date/Time   NA 137 04/20/2018 1537   NA 141  11/03/2017 1310   K 3.6 04/20/2018 1537   CL 99 04/20/2018 1537   CO2 25 04/20/2018 1537   GLUCOSE 240 (H) 04/20/2018 1537   BUN 22 (H) 04/20/2018 1537   BUN 11 11/03/2017 1310   CREATININE 1.11 (H) 04/20/2018 1537   CALCIUM 10.1 04/20/2018 1537   GFRNONAA 59 (L) 04/20/2018 1537   GFRAA >60 04/20/2018 1537   Lab Results  Component Value Date   HGBA1C 8.5 (H) 02/15/2018   HGBA1C 8.4 (H) 11/03/2017   HGBA1C 7.5 (H) 05/18/2015   HGBA1C 8.7 (H) 02/05/2013   HGBA1C 6.9 (H) 06/03/2012   No results found for: INSULIN CBC    Component Value Date/Time   WBC 13.1 (H) 04/20/2018 1537   RBC 5.26 (H) 04/20/2018 1537   HGB 13.5 04/20/2018 1537   HCT 42.4 04/20/2018 1537   PLT 348 04/20/2018 1537   MCV 80.6 04/20/2018 1537   MCH 25.7 (L) 04/20/2018 1537   MCHC 31.8 04/20/2018 1537   RDW 14.6 04/20/2018 1537   LYMPHSABS 1.3 05/11/2017 1323   MONOABS 0.6 05/11/2017 1323   EOSABS 0.2 05/11/2017 1323   BASOSABS 0.0 05/11/2017 1323   Iron/TIBC/Ferritin/ %Sat    Component Value Date/Time   IRON 114 11/01/2015 1031   TIBC 372 11/01/2015 1031   FERRITIN 35 11/01/2015 1031   IRONPCTSAT 31 11/01/2015 1031   Lipid Panel     Component Value Date/Time   CHOL 169 02/15/2018 1034   TRIG 168 (H) 02/15/2018 1034   HDL 41 02/15/2018 1034   CHOLHDL 6.0 10/17/2011 0315  VLDL 59 (H) 10/17/2011 0315   LDLCALC 94 02/15/2018 1034   Hepatic Function Panel     Component Value Date/Time   PROT 8.0 04/20/2018 1537   PROT 6.8 11/03/2017 1310   ALBUMIN 3.7 04/20/2018 1537   ALBUMIN 3.8 11/03/2017 1310   AST 18 04/20/2018 1537   ALT 14 04/20/2018 1537   ALKPHOS 60 04/20/2018 1537   BILITOT 0.8 04/20/2018 1537   BILITOT 0.3 11/03/2017 1310   BILIDIR <0.1 06/03/2012 0220   IBILI NOT CALCULATED 06/03/2012 0220      Component Value Date/Time   TSH 0.947 11/03/2017 1310   TSH 2.020 09/08/2017 1129   TSH 1.045 06/03/2012 0520   Results for EMERA, BUSSIE (MRN [id number]) as of 08/17/2018  14:45  Ref. Range 02/15/2018 10:34  Vitamin D, 25-Hydroxy Latest Ref Range: 30.0 - 100.0 ng/mL 26.5 (L)   I, Michaelene Song, am acting as Location manager for [employer], DO  I have reviewed the above documentation for accuracy and completeness, and I agree with the above. -Jearld Lesch, DO

## 2018-08-19 ENCOUNTER — Ambulatory Visit (INDEPENDENT_AMBULATORY_CARE_PROVIDER_SITE_OTHER): Payer: HMO | Admitting: Bariatrics

## 2018-08-30 DIAGNOSIS — Z6838 Body mass index (BMI) 38.0-38.9, adult: Secondary | ICD-10-CM | POA: Diagnosis not present

## 2018-08-30 DIAGNOSIS — E1140 Type 2 diabetes mellitus with diabetic neuropathy, unspecified: Secondary | ICD-10-CM | POA: Diagnosis not present

## 2018-08-30 DIAGNOSIS — E559 Vitamin D deficiency, unspecified: Secondary | ICD-10-CM | POA: Diagnosis not present

## 2018-08-30 DIAGNOSIS — I10 Essential (primary) hypertension: Secondary | ICD-10-CM | POA: Diagnosis not present

## 2018-08-30 DIAGNOSIS — E7800 Pure hypercholesterolemia, unspecified: Secondary | ICD-10-CM | POA: Diagnosis not present

## 2018-08-30 DIAGNOSIS — G629 Polyneuropathy, unspecified: Secondary | ICD-10-CM | POA: Diagnosis not present

## 2018-08-30 DIAGNOSIS — E118 Type 2 diabetes mellitus with unspecified complications: Secondary | ICD-10-CM | POA: Diagnosis not present

## 2018-08-30 DIAGNOSIS — I639 Cerebral infarction, unspecified: Secondary | ICD-10-CM | POA: Diagnosis not present

## 2018-08-31 ENCOUNTER — Telehealth (INDEPENDENT_AMBULATORY_CARE_PROVIDER_SITE_OTHER): Payer: HMO | Admitting: Bariatrics

## 2018-08-31 ENCOUNTER — Other Ambulatory Visit: Payer: Self-pay

## 2018-08-31 ENCOUNTER — Encounter (INDEPENDENT_AMBULATORY_CARE_PROVIDER_SITE_OTHER): Payer: Self-pay | Admitting: Bariatrics

## 2018-08-31 DIAGNOSIS — Z794 Long term (current) use of insulin: Secondary | ICD-10-CM | POA: Diagnosis not present

## 2018-08-31 DIAGNOSIS — E1140 Type 2 diabetes mellitus with diabetic neuropathy, unspecified: Secondary | ICD-10-CM

## 2018-08-31 DIAGNOSIS — Z6839 Body mass index (BMI) 39.0-39.9, adult: Secondary | ICD-10-CM

## 2018-08-31 DIAGNOSIS — E559 Vitamin D deficiency, unspecified: Secondary | ICD-10-CM | POA: Diagnosis not present

## 2018-09-01 NOTE — Progress Notes (Signed)
Office: [phone]  /  Fax: [phone] TeleHealth Visit:  Tina [name] has verbally consented to this TeleHealth visit today. The patient is located at home, the provider is located at the [employer] [facility] office. The participants in this visit include the listed provider and patient and any and all parties involved. The visit was conducted today via WebEx.  HPI:   Chief Complaint: OBESITY Tina [name] is here to discuss her progress with her obesity treatment plan. She is on the Category 3 plan and is following her eating plan approximately 45 % of the time. She states she is walking 2 to 3 miles, 5 times per week. Tina [name] states that she is up 5 pounds (weight 235 lbs 08/31/18). She is tired and stressed due to COVID-19. Tina [name] is doing well with her walking. She wants to lose 50 pounds by her [age] birthday.  We were unable to weigh the patient today for this TeleHealth visit. She feels as if she has gained weight since her last visit. She has lost 21 lbs since starting treatment with [name].  Uncontrolled Diabetes type 2 with hyperglycemia Tina [name] has a diagnosis of diabetes type II. She is on Basaglar (decreased to 16 units), Novolog and Victoza. Tina [name] denies any hypoglycemic episodes. Her A1c was at 8.0 yesterday per patient. She has been working on intensive lifestyle modifications including diet, exercise, and weight loss to help control her blood glucose levels.  Vitamin D deficiency Tina [name] has a diagnosis of vitamin D deficiency. She is currently taking vit D and denies nausea, vomiting or muscle weakness.  ASSESSMENT AND PLAN:  Type 2 diabetes mellitus with diabetic neuropathy, with long-term current use of insulin (HCC)  Vitamin D deficiency  Class 2 severe obesity with serious comorbidity and body mass index (BMI) of 39.0 to 39.9 in adult, unspecified obesity type ([place])  PLAN:  Diabetes II type 2 with hyperglycemia Dottie has been given extensive  diabetes education by myself today including ideal fasting and post-prandial blood glucose readings, individual ideal Hgb A1c goals and hypoglycemia prevention. We discussed the importance of good blood sugar control to decrease the likelihood of diabetic complications such as nephropathy, neuropathy, limb loss, blindness, coronary artery disease, and death. We discussed the importance of intensive lifestyle modification including diet, exercise and weight loss as the first line treatment for diabetes. Tina [name] agrees to continue her diabetes medications and she will follow up at the agreed upon time.  Vitamin D Deficiency Tina [name] was informed that low vitamin D levels contributes to fatigue and are associated with obesity, breast, and colon cancer. She agrees to continue to take prescription Vit D _0 ,000 IU every week and will follow up for routine testing of vitamin D, at least 2-3 times per year. She was informed of the risk of over-replacement of vitamin D and agrees to not increase her dose unless she discusses this with [name] first.  Obesity Tina [name] is currently in the action stage of change. As such, her goal is to continue with weight loss efforts She has agreed to follow the Category 3 plan Tina [name] will continue her activities for weight loss and overall health benefits. We discussed the following Behavioral Modification Strategies today: increase H2O intake, no skipping meals, keeping healthy foods in the home, increasing lean protein intake, decreasing simple carbohydrates, increasing vegetables, decrease eating out and work on meal planning and intentional eating Tina [name] will start back to eating breakfast. She will get labs at the next visit.  Tina [name] has agreed to  follow up with our clinic in 2 weeks. She was informed of the importance of frequent follow up visits to maximize her success with intensive lifestyle modifications for her multiple health conditions.  ALLERGIES: Allergies   Allergen Reactions  . Contrast Media [Iodinated Diagnostic Agents] Other (See Comments)    Difficulty breathing  . Iohexol Hives, Nausea And Vomiting and Swelling     Desc: Magnevist-gadolinium-difficulty breathing, throat swelling   . Midazolam Hcl Anaphylaxis    Difficulty breathing  . Shellfish Allergy Anaphylaxis  . Valsartan Swelling  . Metformin And Related Diarrhea and Nausea And Vomiting  . Other Itching    Patient is allergic to all nuts except peanuts.   . Avandia [Rosiglitazone Maleate] Hives and Other (See Comments)  . Geodon [Ziprasidone] Other (See Comments)    UNKNOWN  . Kiwi Extract Itching and Swelling  . Latex Itching    MEDICATIONS: Current Outpatient Medications on File Prior to Visit  Medication Sig Dispense Refill  . allopurinol (ZYLOPRIM) 100 MG tablet Take 100 mg by mouth every evening.    Marland Kitchen amLODipine (NORVASC) 10 MG tablet TAKE 1 TABLET BY MOUTH EVERY DAY 90 tablet 1  . aspirin 325 MG tablet Take 325 mg by mouth at bedtime.     Marland Kitchen atorvastatin (LIPITOR) 10 MG tablet Take 1 tablet (10 mg total) by mouth daily. 30 tablet 0  . cetirizine (ZYRTEC) 10 MG tablet Take 10 mg by mouth daily as needed for allergies.   11  . cloNIDine (CATAPRES) 0.1 MG tablet Take 0.1 mg by mouth 2 (two) times daily.  0  . colchicine 0.6 MG tablet Take 0.6 mg by mouth daily as needed (For gout flare-up.).   1  . EPINEPHrine 0.3 mg/0.3 mL IJ SOAJ injection INJECT AS DIRECTED AS NEEDED FOR ALLERGIC REACTION 1 Device 2  . fluticasone (FLONASE) 50 MCG/ACT nasal spray daily as needed.     . gabapentin (NEURONTIN) 600 MG tablet Take 600 mg by mouth 3 (three) times daily.     . hydrochlorothiazide (HYDRODIURIL) 50 MG tablet Take 50 mg by mouth daily.     . insulin aspart (NOVOLOG) 100 UNIT/ML injection Inject 5-15 Units into the skin 3 (three) times daily with meals. Per sliding scale--pt uses Omnipod and gets a basal rate     . Insulin Glargine (BASAGLAR KWIKPEN) 100 UNIT/ML SOPN Inject 18  Units into the skin daily.     . Liraglutide (VICTOZA) 18 MG/3ML SOPN Inject 1.2 mg into the skin daily.    Marland Kitchen omeprazole (PRILOSEC) 40 MG capsule TAKE 1 CAPSULE BY MOUTH EVERY DAY 90 capsule 1  . ondansetron (ZOFRAN-ODT) 4 MG disintegrating tablet Take 1 tablet (4 mg total) by mouth every 8 (eight) hours as needed for nausea or vomiting. Dissolve under tongue 90 tablet 1  . propranolol (INDERAL) 20 MG tablet Take 20 mg by mouth daily.    Marland Kitchen spironolactone (ALDACTONE) 50 MG tablet Take 1 tablet (50 mg total) by mouth daily. 90 tablet 3  . SUPREP BOWEL PREP KIT 17.5-3.13-1.6 GM/177ML SOLN Suprep-Use as directed 354 mL 0  . tiZANidine (ZANAFLEX) 2 MG tablet TAKE 1 TABLET AT BEDTIME DAILY. CAN TAKE 1/2 TABLET IN THE AM AS NEEDED (Patient taking differently: Take 1-2 mg by mouth See admin instructions. Take 45m at bedtime daily. Can take 142min the morning as needed for muscle spasms.) 135 tablet 1  . traMADol (ULTRAM) 50 MG tablet Take 1 tablet (50 mg total) by mouth 3 (three) times daily.  BID (may take TID as needed) 90 tablet 5  . Vitamin D, Ergocalciferol, (DRISDOL) 1.25 MG (50000 UT) CAPS capsule Take 1 capsule (50,000 Units total) by mouth every 7 (seven) days. 4 capsule 0   No current facility-administered medications on file prior to visit.     PAST MEDICAL HISTORY: Past Medical History:  Diagnosis Date  . Allergy   . Anemia    DURING MENSES--HAS HEAVY BLEEDING WITH PERODS  . Anxiety   . Back pain, chronic    "ongoing"  . Blood transfusion    IN 2012  AFTER C -SECTION  . Cerebral thrombosis with cerebral infarction [facility]) JUNE 2011   RIGHT SIDED WEAKNESS ( ARM AND LEG ) AND SPASMS-remains with slight weakness and vertigo.  . Constipation   . Depression   . Diabetes mellitus   . Diabetic neuropathy ([place])    BOTH FEET --COMES AND GOES  . Edema, lower extremity   . Endometriosis   . Fatty liver   . GERD (gastroesophageal reflux disease)    with pregnancy  . H/O eye surgery   .  Headache(784.0)    MIGRAINES--NOT REALLY HEADACHE-MORE LIKE PRESSURE SENSATION IN HEAD-FEELS DIZZIY AND  FAINT AS THE PRESSURE RESOLVES  . Hernia, incisional, RLQ, s/p lap repair Sep 2013 09/02/2011  . History of vertigo 03/22/2018  . Hx of migraines 10/19/2011  . Hypertension   . Leg pain, right    "like bad Charley horse"  . Multiple food allergies   . Panic disorder without agoraphobia   . Rash    HANDS, ARMS --STATES HX OF RASH EVER SINCE CHILDBIRTH/PREGNANCY.  STATES THE RASH OFTEN OCCURS WHEN SHE IS REALLY STRESSED."goes and comes-presently left ring finger"  . Restless leg syndrome    DIAGNOSED BY SLEEP STUDY - PT TOLD SHE DID NOT HAVE SLEEP APNEA  . Right rotator cuff tear    PAIN IN RIGHT SHOULDER  . SBO (small bowel obstruction) ([place]) 06/03/2012  . Shortness of breath   . Spastic hemiplegia affecting dominant side ([place])   . Stomach problems   . Stroke ([place])   . Ventral hernia    RIGHT LOWER QUADRANT-CAUSING SOME PAIN  . Weakness of right side of body     PAST SURGICAL HISTORY: Past Surgical History:  Procedure Laterality Date  . APPLICATION OF WOUND VAC N/A 05/25/2015   Procedure: APPLICATION OF WOUND VAC;  Surgeon: Michael Boston, MD;  Location: [facility];  Service: General;  Laterality: N/A;  . CESAREAN SECTION  2012  . COLONOSCOPY    . DIAGNOSTIC LAPAROSCOPY    . ESOPHAGOGASTRODUODENOSCOPY N/A 06/03/2012   Procedure: ESOPHAGOGASTRODUODENOSCOPY (EGD);  Surgeon: [name] Craver, MD;  Location[facility] ENDOSCOPY;  Service: Endoscopy;  Laterality: N/A;  . EXCISION MASS ABDOMINAL N/A 05/25/2015   Procedure: ABDOMINAL WALL EXPLORATION EXCISION OF SEROMA REMOVAL OF REDUNDANT SKIN ;  Surgeon: Michael Boston, MD;  Location: [facility];  Service: General;  Laterality: N/A;  . EYE SURGERY     Eye laser for vessel hemorrhaging  . fybroid removal    . HERNIA REPAIR  10/03/11   ventral hernia repair  . INSERTION OF MESH N/A 02/04/2013   Procedure: INSERTION OF MESH;  Surgeon: Adin Hector, MD;   Location: [facility];  Service: General;  Laterality: N/A;  . UMBILICAL HERNIA REPAIR N/A 02/04/2013   Procedure: LAPAROSCOPIC ventral wall hernia repair LAPAROSCOPIC LYSIS OF ADHESIONS laparoscopic exploration of abdomen ;  Surgeon: Adin Hector, MD;  Location: [facility];  Service: General;  Laterality: N/A;  . UPPER GASTROINTESTINAL ENDOSCOPY    . URETER REVISION     Bilateral "twisted"  . UTERINE FIBROID SURGERY     2 SURGERIES FOR FIBROIDS  . VENTRAL HERNIA REPAIR  10/03/2011   Procedure: LAPAROSCOPIC VENTRAL HERNIA;  Surgeon: Adin Hector, MD;  Location: [facility];  Service: General;  Laterality: N/A;    SOCIAL HISTORY: Social History   Tobacco Use  . Smoking status: Never Smoker  . Smokeless tobacco: Never Used  Substance Use Topics  . Alcohol use: No    Alcohol/week: 0.0 standard drinks  . Drug use: No    FAMILY HISTORY: Family History  Problem Relation Age of Onset  . Diabetes Father   . Kidney disease Father   . Depression Father   . Drug abuse Father   . Allergic rhinitis Mother   . Eczema Mother   . Urticaria Mother   . Depression Mother   . Anxiety disorder Mother   . Bipolar disorder Mother   . Alcoholism Mother   . Drug abuse Mother   . Eating disorder Mother   . Diabetes Maternal Grandmother   . Hyperlipidemia Paternal Grandmother   . Stroke Paternal Grandmother   . Eczema Sister   . Urticaria Sister   . Colon cancer Paternal Uncle   . Other Neg Hx   . Angioedema Neg Hx   . Asthma Neg Hx   . Colon polyps Neg Hx   . Esophageal cancer Neg Hx   . Rectal cancer Neg Hx   . Stomach cancer Neg Hx     ROS: Review of Systems  Constitutional: Negative for weight loss.  Gastrointestinal: Negative for nausea and vomiting.  Musculoskeletal:       Negative for muscle weakness  Endo/Heme/Allergies:       Negative for hypoglycemia    PHYSICAL EXAM: Pt in no acute distress  RECENT LABS AND TESTS: BMET    Component Value Date/Time   NA 137 04/20/2018  1537   NA 141 11/03/2017 1310   K 3.6 04/20/2018 1537   CL 99 04/20/2018 1537   CO2 25 04/20/2018 1537   GLUCOSE 240 (H) 04/20/2018 1537   BUN 22 (H) 04/20/2018 1537   BUN 11 11/03/2017 1310   CREATININE 1.11 (H) 04/20/2018 1537   CALCIUM 10.1 04/20/2018 1537   GFRNONAA 59 (L) 04/20/2018 1537   GFRAA >60 04/20/2018 1537   Lab Results  Component Value Date   HGBA1C 8.5 (H) 02/15/2018   HGBA1C 8.4 (H) 11/03/2017   HGBA1C 7.5 (H) 05/18/2015   HGBA1C 8.7 (H) 02/05/2013   HGBA1C 6.9 (H) 06/03/2012   No results found for: INSULIN CBC    Component Value Date/Time   WBC 13.1 (H) 04/20/2018 1537   RBC 5.26 (H) 04/20/2018 1537   HGB 13.5 04/20/2018 1537   HCT 42.4 04/20/2018 1537   PLT 348 04/20/2018 1537   MCV 80.6 04/20/2018 1537   MCH 25.7 (L) 04/20/2018 1537   MCHC 31.8 04/20/2018 1537   RDW 14.6 04/20/2018 1537   LYMPHSABS 1.3 05/11/2017 1323   MONOABS 0.6 05/11/2017 1323   EOSABS 0.2 05/11/2017 1323   BASOSABS 0.0 05/11/2017 1323   Iron/TIBC/Ferritin/ %Sat    Component Value Date/Time   IRON 114 11/01/2015 1031   TIBC 372 11/01/2015 1031   FERRITIN 35 11/01/2015 1031   IRONPCTSAT 31 11/01/2015 1031   Lipid Panel     Component Value Date/Time   CHOL 169 02/15/2018 1034   TRIG  168 (H) 02/15/2018 1034   HDL 41 02/15/2018 1034   CHOLHDL 6.0 10/17/2011 0315   VLDL 59 (H) 10/17/2011 0315   LDLCALC 94 02/15/2018 1034   Hepatic Function Panel     Component Value Date/Time   PROT 8.0 04/20/2018 1537   PROT 6.8 11/03/2017 1310   ALBUMIN 3.7 04/20/2018 1537   ALBUMIN 3.8 11/03/2017 1310   AST 18 04/20/2018 1537   ALT 14 04/20/2018 1537   ALKPHOS 60 04/20/2018 1537   BILITOT 0.8 04/20/2018 1537   BILITOT 0.3 11/03/2017 1310   BILIDIR <0.1 06/03/2012 0220   IBILI NOT CALCULATED 06/03/2012 0220      Component Value Date/Time   TSH 0.947 11/03/2017 1310   TSH 2.020 09/08/2017 1129   TSH 1.045 06/03/2012 0520     Ref. Range 02/15/2018 10:34  Vitamin D,  25-Hydroxy Latest Ref Range: 30.0 - 100.0 ng/mL 26.5 (L)    I, Doreene Nest, am acting as Location manager for [employer]. Owens Shark, DO  I have reviewed the above documentation for accuracy and completeness, and I agree with the above. -Jearld Lesch, DO

## 2018-09-01 NOTE — Progress Notes (Addendum)
Office: [phone]  /  Fax: [phone]    Date: September 07, 2018   Appointment Start Time: 10:04am Duration: 24 minutes Provider: Glennie Isle, Psy.D. Type of Session: Individual Therapy  Location of Patient: Home Location of Provider: [facility] Type of Contact: Telepsychological Visit via [employer] WebEx   Session Content: Tina Tina [name] is a 48 y.o. female presenting via [place] for a follow-up appointment to address the previously established treatment goal of decreasing emotional eating. Of note, this provider called Tina Tina [name] at 10:32am as she did not present for the [facility] appointment. She noted she could not find the e-mail. Thus, the e-mail with the secure link was re-sent. As such, today's appointment was initiated4 minutes late. Today's appointment was a telepsychological visit, as this provider's clinic is seeing a limited number of patients for in-person visits due to COVID-19. Therapeutic services will resume to in-person appointments once deemed appropriate. Tina Tina [name] expressed understanding regarding the rationale for telepsychological services, and provided verbal consent for today's appointment. Prior to proceeding with today's appointment, Tina [name]'s physical location at the time of this appointment was obtained. Tina Tina [name] reported she was at home and provided the address. In the event of technical difficulties, Tina Tina [name] shared a phone number she could be reached at. Tina Tina [name] and this provider participated in today's telepsychological service. Also, Tina Tina [name] denied anyone else being present in the room or on the WebEx appointment.  This provider conducted a brief check-in and verbally administered the PHQ-9 and GAD-7. Tina Tina [name] shared, "I've been sick." She believes it is secondary to allergies. It was recommended she reach out for to her PCP; she agreed. Aside from being sick, Tina Tina [name] reported, "I've been doing pretty good." This provider checked-in regarding scheduling per  the referral. The number for the office was provided, and Tina Tina [name] noted a plan to call after today's appointment. Tina Tina [name] also shared that her "mom's play room" is almost complete. This was positively reinforced. Moreover, she shared she started school and is pursuing a degree in social work. Currently, she is completing pre-requisites to apply to graduate school.   Regarding eating, Tina [name] reported, "It's been a roller coaster due to being sick." She shared she is focusing on hydrating. Prior to getting sick, Tina [name] reported she is consistently eating breakfast and also noted she has been regularly walking. This was positively reinforced. Furthermore, Tina Tina [name] reported an overall reduction in emotional eating since the onset of treatment with this provider. She also shared engaging in learned skills. A plan was developed to help Tina [name] cope with emotional eating using learned skills. She wrote down the following plan: focus on hydration, be prepared with snacks congruent to the meal plan, pause to ask questions when triggered to eat (e.g., Am I really hungry?; Is there something bothering me? Will I feel better if I eat?), and engage in coping skills after going through the aforementioned questions. Overall,  Tina Tina [name] was receptive to today's session as evidenced by openness to sharing, responsiveness to feedback, and willingness to engage in learned skills.  Mental Status Examination:  Appearance: neat Behavior: cooperative Mood: euthymic Affect: mood congruent Speech: normal in rate, volume, and tone Eye Contact: appropriate Psychomotor Activity: appropriate Thought Process: linear, logical, and goal directed  Content/Perceptual Disturbances: denies suicidal and homicidal ideation, plan, and intent and no hallucinations, delusions, bizarre thinking or behavior reported or observed Orientation: time, person, place and purpose of appointment Cognition/Sensorium: memory, attention, language, and fund  of knowledge intact  Insight: good Judgment: good  Structured Assessment Results: The Patient  Health Questionnaire-9 (PHQ-9) is a self-report measure that assesses symptoms and severity of depression over the course of the last two weeks. Tina Tina [name] obtained a score of . suggesting minimal depression. Tina Tina [name] finds the endorsed symptoms to be somewhat difficult. Tina Tina [name] 0  Feeling down, depressed, or hopeless 0  Trouble falling or staying asleep, or sleeping too much 2  Feeling tired or having Tina energy 0  Poor appetite or overeating 1  Feeling bad about yourself --- or that you are a failure or have let yourself or your family down 0  Trouble concentrating on Tina [name], such as reading the newspaper or watching television 1  Moving or speaking so slowly that other people could have noticed? Or the opposite --- being so fidgety or restless that you have been moving around a lot more than usual 0  Thoughts that you would be better off dead or hurting yourself in some way 0  PHQ-9 Score 4    The Generalized Anxiety Disorder-7 (GAD-7) is a brief self-report measure that assesses symptoms of anxiety over the course of the last two weeks. Tina Tina [name] obtained a score of 1 suggesting minimal anxiety. Tina Tina [name] finds the endorsed symptoms to be not difficult at all. Feeling nervous, anxious, on edge 0  Not being able to stop or control worrying 0  Worrying too much about different Tina [name] 0  Trouble relaxing 0  Being so restless that it's hard to sit still 0  Becoming easily annoyed or irritable 1  Feeling afraid as if something awful might happen 0  GAD-7 Score 1   Interventions:  Conducted a brief chart review Verbal administration of PHQ-9 and GAD-7 for symptom monitoring Provided empathic reflections and validation Reviewed learned skills Provided positive reinforcement Employed supportive psychotherapy interventions to facilitate reduced distress, and to  improve coping skills with identified stressors  DSM-5 Diagnosis: 296.31 (F33.0) Major Depressive Disorder, Recurrent Episode, Mild, With Anxious Distress  Treatment Goal & Progress: During the initial appointment with this provider, the following treatment goal was established: decrease emotional eating. Tina Tina [name] has demonstrated progress in her goal as evidenced by increased awareness of hunger patterns and triggers for emotional eating. She also reported a reduction in emotional eating since the onset of treatment with this provider and continues to demonstrate willingness to engage in learned skills.   Plan: Today was Tina Tina [name]'s last appointment with this provider. She shared she will be scheduling an appointment with [place] per the referral placed. No further follow-up planned by this provider.   Addendum: A referral was placed for longer-term therapeutic services at [facility] on 07/12/2018 by this provider. Per Epic, Tina Tina [name] was scheduled for an appointment with a new provider, but during a follow-up appointment with this provider, Tina Tina [name] noted the new provider did not present for the [facility] appointment. Thus, another referral was placed on 08/17/2018. Per Epic, "Called patient three times,with no return call to schedule." This provider requested Tina Tina [name], [place] with [facility] call to follow-up with Tina Tina [name] on three separate occasions. Ms. Tina Tina [name] left Tina Tina [name] voicemails on 08/17/2018 and 09/07/2018. Since an appointment was not scheduled, Tina Tina [name] called again on 09/29/2018 and provided [place] the number for [place]. No further follow-up planned by this provider or provider's clinic.

## 2018-09-07 ENCOUNTER — Ambulatory Visit (INDEPENDENT_AMBULATORY_CARE_PROVIDER_SITE_OTHER): Payer: HMO | Admitting: Psychology

## 2018-09-07 ENCOUNTER — Other Ambulatory Visit: Payer: Self-pay

## 2018-09-07 DIAGNOSIS — F330 Major depressive disorder, recurrent, mild: Secondary | ICD-10-CM | POA: Diagnosis not present

## 2018-09-13 ENCOUNTER — Other Ambulatory Visit: Payer: Self-pay | Admitting: Physical Medicine & Rehabilitation

## 2018-09-14 ENCOUNTER — Other Ambulatory Visit: Payer: Self-pay

## 2018-09-14 ENCOUNTER — Encounter (INDEPENDENT_AMBULATORY_CARE_PROVIDER_SITE_OTHER): Payer: Self-pay | Admitting: Bariatrics

## 2018-09-14 ENCOUNTER — Ambulatory Visit (INDEPENDENT_AMBULATORY_CARE_PROVIDER_SITE_OTHER): Payer: HMO | Admitting: Bariatrics

## 2018-09-14 DIAGNOSIS — E119 Type 2 diabetes mellitus without complications: Secondary | ICD-10-CM

## 2018-09-14 DIAGNOSIS — E66812 Obesity, class 2: Secondary | ICD-10-CM

## 2018-09-14 DIAGNOSIS — Z794 Long term (current) use of insulin: Secondary | ICD-10-CM | POA: Diagnosis not present

## 2018-09-14 DIAGNOSIS — E559 Vitamin D deficiency, unspecified: Secondary | ICD-10-CM

## 2018-09-14 DIAGNOSIS — Z6836 Body mass index (BMI) 36.0-36.9, adult: Secondary | ICD-10-CM | POA: Diagnosis not present

## 2018-09-14 NOTE — Progress Notes (Signed)
Office: [phone]  /  Fax: [phone] TeleHealth Visit:  Tina [name] has verbally consented to this TeleHealth visit today. The patient is located at home, the provider is located at the [facility] office. The participants in this visit include the listed provider and patient. The visit was conducted today via Webex.  HPI:   Chief Complaint: OBESITY Tina [name] is here to discuss her progress with her obesity treatment plan. She is on the Category 3 plan and is following her eating plan approximately 75% of the time. She states she is exercising 0 minutes 0 times per week. Tina [name] states that she has lost 2 lbs and is doing well (weight 229). She is trying to get everything together. We were unable to weigh the patient today for this TeleHealth visit. She feels as if she has lost 2 lbs since her last visit. She has lost 5 lbs since starting treatment with [name].  Diabetes II Tina [name] has a diagnosis of diabetes type II and is taking NovoLog, Basaglar, and Victoza. Tina [name] states fasting blood sugars are somewhat erratic - sometimes in the 170's. Last A1c was 8.5 on 02/15/2018. She has been working on intensive lifestyle modifications including diet, exercise, and weight loss to help control her blood glucose levels.  Vitamin D deficiency Tina [name] has a diagnosis of Vitamin D deficiency. She is currently taking Vit D and denies nausea, vomiting or muscle weakness.  ASSESSMENT AND PLAN:  Vitamin D deficiency  Type 2 diabetes mellitus without complication, with long-term current use of insulin (HCC)  Class 2 severe obesity with serious comorbidity and body mass index (BMI) of 36.0 to 36.9 in adult, unspecified obesity type ([place])  PLAN:  Diabetes II Tina [name] has been given extensive diabetes education by myself today including ideal fasting and post-prandial blood glucose readings, individual ideal HgA1c goals  and hypoglycemia prevention. We discussed the importance of  good blood sugar control to decrease the likelihood of diabetic complications such as nephropathy, neuropathy, limb loss, blindness, coronary artery disease, and death. We discussed the importance of intensive lifestyle modification including diet, exercise and weight loss as the first line treatment for diabetes. Tina [name] agrees to continue her diabetes medications, increase protein, decrease carbohydrates, and follow-up at the agreed upon time.  Vitamin D Deficiency Tina [name] was informed that low Vitamin D levels contributes to fatigue and are associated with obesity, breast, and colon cancer. She agrees to continue taking Vit D and will follow-up for routine testing of Vitamin D, at least 2-3 times per year. She was informed of the risk of over-replacement of Vitamin D and agrees to not increase her dose unless she discusses this with [name] first. Tina [name] agrees to follow-up with our clinic in 2 weeks.  Obesity Tina [name] is currently in the action stage of change. As such, her goal is to continue with weight loss efforts. She has agreed to follow the Category 3 plan. Tina [name] will work on meal planning, intentional eating, will cut back on carbs and increase protein, and will have labs at her next visit. Tina [name] has been instructed to work up to a goal of 150 minutes of combined cardio and strengthening exercise per week for weight loss and overall health benefits. We discussed the following Behavioral Modification Strategies today: increasing lean protein intake, decreasing simple carbohydrates, increasing vegetables, increase H20 intake, decrease eating out, no skipping meals, work on meal planning and easy cooking plans, keeping healthy foods in the home, and planning for success.  Tina [name] has agreed  to follow-up with our clinic in 2 weeks fasting. She was informed of the importance of frequent follow-up visits to maximize her success with intensive lifestyle modifications for her multiple health  conditions.  ALLERGIES: Allergies  Allergen Reactions  . Contrast Media [Iodinated Diagnostic Agents] Other (See Comments)    Difficulty breathing  . Iohexol Hives, Nausea And Vomiting and Swelling     Desc: Magnevist-gadolinium-difficulty breathing, throat swelling   . Midazolam Hcl Anaphylaxis    Difficulty breathing  . Shellfish Allergy Anaphylaxis  . Valsartan Swelling  . Metformin And Related Diarrhea and Nausea And Vomiting  . Other Itching    Patient is allergic to all nuts except peanuts.   . Avandia [Rosiglitazone Maleate] Hives and Other (See Comments)  . Geodon [Ziprasidone] Other (See Comments)    UNKNOWN  . Kiwi Extract Itching and Swelling  . Latex Itching    MEDICATIONS: Current Outpatient Medications on File Prior to Visit  Medication Sig Dispense Refill  . allopurinol (ZYLOPRIM) 100 MG tablet Take 100 mg by mouth every evening.    Marland Kitchen amLODipine (NORVASC) 10 MG tablet TAKE 1 TABLET BY MOUTH EVERY DAY 90 tablet 1  . aspirin 325 MG tablet Take 325 mg by mouth at bedtime.     Marland Kitchen atorvastatin (LIPITOR) 10 MG tablet Take 1 tablet (10 mg total) by mouth daily. 30 tablet 0  . cetirizine (ZYRTEC) 10 MG tablet Take 10 mg by mouth daily as needed for allergies.   11  . cloNIDine (CATAPRES) 0.1 MG tablet Take 0.1 mg by mouth 2 (two) times daily.  0  . colchicine 0.6 MG tablet Take 0.6 mg by mouth daily as needed (For gout flare-up.).   1  . EPINEPHrine 0.3 mg/0.3 mL IJ SOAJ injection INJECT AS DIRECTED AS NEEDED FOR ALLERGIC REACTION 1 Device 2  . fluticasone (FLONASE) 50 MCG/ACT nasal spray daily as needed.     . gabapentin (NEURONTIN) 600 MG tablet Take 600 mg by mouth 3 (three) times daily.     . hydrochlorothiazide (HYDRODIURIL) 50 MG tablet Take 50 mg by mouth daily.     . insulin aspart (NOVOLOG) 100 UNIT/ML injection Inject 5-15 Units into the skin 3 (three) times daily with meals. Per sliding scale--pt uses Omnipod and gets a basal rate     . Insulin Glargine  (BASAGLAR KWIKPEN) 100 UNIT/ML SOPN Inject 18 Units into the skin daily.     . Liraglutide (VICTOZA) 18 MG/3ML SOPN Inject 1.2 mg into the skin daily.    Marland Kitchen omeprazole (PRILOSEC) 40 MG capsule TAKE 1 CAPSULE BY MOUTH EVERY DAY 90 capsule 1  . ondansetron (ZOFRAN-ODT) 4 MG disintegrating tablet Take 1 tablet (4 mg total) by mouth every 8 (eight) hours as needed for nausea or vomiting. Dissolve under tongue 90 tablet 1  . propranolol (INDERAL) 20 MG tablet Take 20 mg by mouth daily.    Marland Kitchen spironolactone (ALDACTONE) 50 MG tablet Take 1 tablet (50 mg total) by mouth daily. 90 tablet 3  . SUPREP BOWEL PREP KIT 17.5-3.13-1.6 GM/177ML SOLN Suprep-Use as directed 354 mL 0  . tiZANidine (ZANAFLEX) 2 MG tablet TAKE 1 TABLET AT BEDTIME DAILY. CAN TAKE 1/2 TABLET IN THE EVERY MORNING AS NEEDED 135 tablet 1  . traMADol (ULTRAM) 50 MG tablet Take 1 tablet (50 mg total) by mouth 3 (three) times daily. BID (may take TID as needed) 90 tablet 5  . Vitamin D, Ergocalciferol, (DRISDOL) 1.25 MG (50000 UT) CAPS capsule Take 1 capsule (50,000 Units  total) by mouth every 7 (seven) days. 4 capsule 0   No current facility-administered medications on file prior to visit.     PAST MEDICAL HISTORY: Past Medical History:  Diagnosis Date  . Allergy   . Anemia    DURING MENSES--HAS HEAVY BLEEDING WITH PERODS  . Anxiety   . Back pain, chronic    "ongoing"  . Blood transfusion    IN 2012  AFTER C -SECTION  . Cerebral thrombosis with cerebral infarction [facility]) JUNE 2011   RIGHT SIDED WEAKNESS ( ARM AND LEG ) AND SPASMS-remains with slight weakness and vertigo.  . Constipation   . Depression   . Diabetes mellitus   . Diabetic neuropathy ([place])    BOTH FEET --COMES AND GOES  . Edema, lower extremity   . Endometriosis   . Fatty liver   . GERD (gastroesophageal reflux disease)    with pregnancy  . H/O eye surgery   . Headache(784.0)    MIGRAINES--NOT REALLY HEADACHE-MORE LIKE PRESSURE SENSATION IN HEAD-FEELS DIZZIY AND   FAINT AS THE PRESSURE RESOLVES  . Hernia, incisional, RLQ, s/p lap repair Sep 2013 09/02/2011  . History of vertigo 03/22/2018  . Hx of migraines 10/19/2011  . Hypertension   . Leg pain, right    "like bad Charley horse"  . Multiple food allergies   . Panic disorder without agoraphobia   . Rash    HANDS, ARMS --STATES HX OF RASH EVER SINCE CHILDBIRTH/PREGNANCY.  STATES THE RASH OFTEN OCCURS WHEN SHE IS REALLY STRESSED."goes and comes-presently left ring finger"  . Restless leg syndrome    DIAGNOSED BY SLEEP STUDY - PT TOLD SHE DID NOT HAVE SLEEP APNEA  . Right rotator cuff tear    PAIN IN RIGHT SHOULDER  . SBO (small bowel obstruction) ([place]) 06/03/2012  . Shortness of breath   . Spastic hemiplegia affecting dominant side ([place])   . Stomach problems   . Stroke ([place])   . Ventral hernia    RIGHT LOWER QUADRANT-CAUSING SOME PAIN  . Weakness of right side of body     PAST SURGICAL HISTORY: Past Surgical History:  Procedure Laterality Date  . APPLICATION OF WOUND VAC N/A 05/25/2015   Procedure: APPLICATION OF WOUND VAC;  Surgeon: Michael Boston, MD;  Location: [facility];  Service: General;  Laterality: N/A;  . CESAREAN SECTION  2012  . COLONOSCOPY    . DIAGNOSTIC LAPAROSCOPY    . ESOPHAGOGASTRODUODENOSCOPY N/A 06/03/2012   Procedure: ESOPHAGOGASTRODUODENOSCOPY (EGD);  Surgeon: [name] Craver, MD;  Location[facility] END[facility]COPY;  Service: Endoscopy;  Laterality: N/A;  . EXCISION MASS ABDOMINAL N/A 05/25/2015   Procedure: ABDOMINAL WALL EXPLORATION EXCISION OF SEROMA REMOVAL OF REDUNDANT SKIN ;  Surgeon: Michael Boston, MD;  Location: [facility];  Service: General;  Laterality: N/A;  . EYE SURGERY     Eye laser for vessel hemorrhaging  . fybroid removal    . HERNIA REPAIR  10/03/11   ventral hernia repair  . INSERTION OF MESH N/A 02/04/2013   Procedure: INSERTION OF MESH;  Surgeon: Adin Hector, MD;  Location: [facility];  Service: General;  Laterality: N/A;  . UMBILICAL HERNIA REPAIR N/A 02/04/2013   Procedure:  LAPAROSCOPIC ventral wall hernia repair LAPAROSCOPIC LYSIS OF ADHESIONS laparoscopic exploration of abdomen ;  Surgeon: Adin Hector, MD;  Location: [facility];  Service: General;  Laterality: N/A;  . UPPER GASTROINTESTINAL ENDOSCOPY    . URETER REVISION     Bilateral "twisted"  . UTERINE FIBROID SURGERY  2 SURGERIES FOR FIBROIDS  . VENTRAL HERNIA REPAIR  10/03/2011   Procedure: LAPAROSCOPIC VENTRAL HERNIA;  Surgeon: Adin Hector, MD;  Location: [facility];  Service: General;  Laterality: N/A;    SOCIAL HISTORY: Social History   Tobacco Use  . Smoking status: Never Smoker  . Smokeless tobacco: Never Used  Substance Use Topics  . Alcohol use: No    Alcohol/week: 0.0 standard drinks  . Drug use: No    FAMILY HISTORY: Family History  Problem Relation Age of Onset  . Diabetes Father   . Kidney disease Father   . Depression Father   . Drug abuse Father   . Allergic rhinitis Mother   . Eczema Mother   . Urticaria Mother   . Depression Mother   . Anxiety disorder Mother   . Bipolar disorder Mother   . Alcoholism Mother   . Drug abuse Mother   . Eating disorder Mother   . Diabetes Maternal Grandmother   . Hyperlipidemia Paternal Grandmother   . Stroke Paternal Grandmother   . Eczema Sister   . Urticaria Sister   . Colon cancer Paternal Uncle   . Other Neg Hx   . Angioedema Neg Hx   . Asthma Neg Hx   . Colon polyps Neg Hx   . Esophageal cancer Neg Hx   . Rectal cancer Neg Hx   . Stomach cancer Neg Hx    ROS: Review of Systems  Gastrointestinal: Negative for nausea and vomiting.  Musculoskeletal:       Negative for muscle weakness.   PHYSICAL EXAM: Pt in no acute distress  RECENT LABS AND TESTS: BMET    Component Value Date/Time   NA 137 04/20/2018 1537   NA 141 11/03/2017 1310   K 3.6 04/20/2018 1537   CL 99 04/20/2018 1537   CO2 25 04/20/2018 1537   GLUCOSE 240 (H) 04/20/2018 1537   BUN 22 (H) 04/20/2018 1537   BUN 11 11/03/2017 1310   CREATININE 1.11  (H) 04/20/2018 1537   CALCIUM 10.1 04/20/2018 1537   GFRNONAA 59 (L) 04/20/2018 1537   GFRAA >60 04/20/2018 1537   Lab Results  Component Value Date   HGBA1C 8.5 (H) 02/15/2018   HGBA1C 8.4 (H) 11/03/2017   HGBA1C 7.5 (H) 05/18/2015   HGBA1C 8.7 (H) 02/05/2013   HGBA1C 6.9 (H) 06/03/2012   No results found for: INSULIN CBC    Component Value Date/Time   WBC 13.1 (H) 04/20/2018 1537   RBC 5.26 (H) 04/20/2018 1537   HGB 13.5 04/20/2018 1537   HCT 42.4 04/20/2018 1537   PLT 348 04/20/2018 1537   MCV 80.6 04/20/2018 1537   MCH 25.7 (L) 04/20/2018 1537   MCHC 31.8 04/20/2018 1537   RDW 14.6 04/20/2018 1537   LYMPHSABS 1.3 05/11/2017 1323   MONOABS 0.6 05/11/2017 1323   EOSABS 0.2 05/11/2017 1323   BASOSABS 0.0 05/11/2017 1323   Iron/TIBC/Ferritin/ %Sat    Component Value Date/Time   IRON 114 11/01/2015 1031   TIBC 372 11/01/2015 1031   FERRITIN 35 11/01/2015 1031   IRONPCTSAT 31 11/01/2015 1031   Lipid Panel     Component Value Date/Time   CHOL 169 02/15/2018 1034   TRIG 168 (H) 02/15/2018 1034   HDL 41 02/15/2018 1034   CHOLHDL 6.0 10/17/2011 0315   VLDL 59 (H) 10/17/2011 0315   LDLCALC 94 02/15/2018 1034   Hepatic Function Panel     Component Value Date/Time   PROT 8.0 04/20/2018 1537  PROT 6.8 11/03/2017 1310   ALBUMIN 3.7 04/20/2018 1537   ALBUMIN 3.8 11/03/2017 1310   AST 18 04/20/2018 1537   ALT 14 04/20/2018 1537   ALKPHOS 60 04/20/2018 1537   BILITOT 0.8 04/20/2018 1537   BILITOT 0.3 11/03/2017 1310   BILIDIR <0.1 06/03/2012 0220   IBILI NOT CALCULATED 06/03/2012 0220      Component Value Date/Time   TSH 0.947 11/03/2017 1310   TSH 2.020 09/08/2017 1129   TSH 1.045 06/03/2012 0520   Results for DAILYN, REITH (MRN [id number]) as of 09/14/2018 15:29  Ref. Range 02/15/2018 10:34  Vitamin D, 25-Hydroxy Latest Ref Range: 30.0 - 100.0 ng/mL 26.5 (L)   I, Michaelene Song, am acting as Location manager for [employer], DO  I have reviewed the above  documentation for accuracy and completeness, and I agree with the above. -Jearld Lesch, DO

## 2018-09-15 ENCOUNTER — Other Ambulatory Visit: Payer: Self-pay | Admitting: Physical Medicine & Rehabilitation

## 2018-09-15 ENCOUNTER — Encounter (INDEPENDENT_AMBULATORY_CARE_PROVIDER_SITE_OTHER): Payer: Self-pay | Admitting: Bariatrics

## 2018-09-28 DIAGNOSIS — M7989 Other specified soft tissue disorders: Secondary | ICD-10-CM | POA: Diagnosis not present

## 2018-09-28 DIAGNOSIS — I10 Essential (primary) hypertension: Secondary | ICD-10-CM | POA: Diagnosis not present

## 2018-10-01 ENCOUNTER — Encounter: Payer: Self-pay | Admitting: Cardiology

## 2018-10-01 ENCOUNTER — Ambulatory Visit (INDEPENDENT_AMBULATORY_CARE_PROVIDER_SITE_OTHER): Payer: HMO | Admitting: Cardiology

## 2018-10-01 ENCOUNTER — Other Ambulatory Visit: Payer: Self-pay

## 2018-10-01 VITALS — BP 149/78 | HR 86 | Ht 66.0 in | Wt 236.0 lb

## 2018-10-01 DIAGNOSIS — R0789 Other chest pain: Secondary | ICD-10-CM | POA: Diagnosis not present

## 2018-10-01 DIAGNOSIS — I5032 Chronic diastolic (congestive) heart failure: Secondary | ICD-10-CM | POA: Diagnosis not present

## 2018-10-01 DIAGNOSIS — I10 Essential (primary) hypertension: Secondary | ICD-10-CM | POA: Diagnosis not present

## 2018-10-01 DIAGNOSIS — R600 Localized edema: Secondary | ICD-10-CM

## 2018-10-01 DIAGNOSIS — E669 Obesity, unspecified: Secondary | ICD-10-CM

## 2018-10-01 NOTE — Progress Notes (Signed)
Primary Physician:  [name], DO   Patient ID: Tina [name], female    DOB: 1970-08-06, 48 y.o.   MRN: [id number]  Subjective:    Chief Complaint  Patient presents with  . Congestive Heart Failure  . Hypertension  . Chest Pain    2 weeks  . Edema     HPI: [name]  is a 48 y.o. female  with history of CVA with residual right-sided hemiparesis which is essentially improved and does very minimal residual defect, uncontrolled diabetes mellitus, obesity, hypertension with dyspnea on exertion and leg swelling. Had negative lexiscan nuclear stress test in August 2018 as well as Echocardiogram revealing grade 2 diastolic dysfunction with normal LVEF.  Since last seen by me she, she was seen in the ER in April for abdominal pain and acute rectal bleeding. She is on ASA due to history of CVA, but this was temporarily held in view of this. She was evaluated by Dr. Havery Moros and underwent colonoscopy was done April 16th. She had gross changes in her colon concerning for ischemic colitis in the proximal left colon to transverse colon. Biopsies taken and showed nonspecific inflammatory changes.EGD also done at that time showing some mild gastritis and benign polyps of the duodenum. Biopsies negative for H pylori. CT scan did not show any bowel pathology, but ? resolving hematoma but her surgery was years ago, and this has been seen on prior scans in 2018. He is planning to repeat colonoscopy to ensure no persistent inflammatory changes and to reassess her colon. She feels that her GI symptoms are overall improved. No recurrent bleeding since being back on ASA.  She is here for 30-monthoffice visit, presently doing well.  Dyspnea on exertion has been stable.  She is started walking regularly.  She does admit to left-sided chest pain for the last 2 to 3 weeks after painting her bedroom.  States chest pain is exacerbated by lifting her arm or certain positions.  Chest pain does radiate  to her back.  Not associated with walking or have any associated shortness of breath or jaw pain.  She has recently developed worsening leg swelling, amlodipine was discontinued as possible etiology and she has since noticed some improvement in her leg edema.  States Aldactone was increased to 100 mg by her PCP and she is to follow-up with her in 2 weeks for reevaluation of her blood pressure.  She continues to follow with Dr. KNeldon Mcfor her multiple allergies. Found to be allergic to Victoza; however, she is currently tolerating right now.  She has not had any recent flare ups.  Reports labs with her PCP have been stable.   Past Medical History:  Diagnosis Date  . Allergy   . Anemia    DURING MENSES--HAS HEAVY BLEEDING WITH PERODS  . Anxiety   . Back pain, chronic    "ongoing"  . Blood transfusion    IN 2012  AFTER C -SECTION  . Cerebral thrombosis with cerebral infarction ([facility] JUNE 2011   RIGHT SIDED WEAKNESS ( ARM AND LEG ) AND SPASMS-remains with slight weakness and vertigo.  . Constipation   . Depression   . Diabetes mellitus   . [place]abetic [place]    BOTH FEET --COMES AND GOES  . Edema, lower extremity   . Endometriosis   . Fatty liver   . GERD (gastroesophageal reflux disease)    with pregnancy  . H/O eye surgery   . Headache(784.0)  MIGRAINES--NOT REALLY HEADACHE-MORE LIKE PRESSURE SENSATION IN HEAD-FEELS DIZZIY AND  FAINT AS THE PRESSURE RESOLVES  . Hernia, incisional, RLQ, s/p lap repair Sep 2013 09/02/2011  . History of vertigo 03/22/2018  . Hx of migraines 10/19/2011  . Hypertension   . Leg pain, right    "like bad Charley horse"  . Multiple food allergies   . Panic disorder without agoraphobia   . Rash    HANDS, ARMS --STATES HX OF RASH EVER SINCE CHILDBIRTH/PREGNANCY.  STATES THE RASH OFTEN OCCURS WHEN SHE IS REALLY STRESSED."goes and comes-presently left ring finger"  . Restless leg syndrome    DIAGNOSED BY SLEEP STUDY - PT TOLD SHE DID NOT HAVE SLEEP  APNEA  . Right rotator cuff tear    PAIN IN RIGHT SHOULDER  . SBO (small bowel obstruction) ([place]) 06/03/2012  . Shortness of breath   . Spastic hemiplegia affecting dominant side ([place])   . Stomach problems   . Stroke ([place])   . Ventral hernia    RIGHT LOWER QUADRANT-CAUSING SOME PAIN  . Weakness of right side of body     Past Surgical History:  Procedure Laterality Date  . APPLICATION OF WOUND VAC N/A 05/25/2015   Procedure: APPLICATION OF WOUND VAC;  Surgeon: Michael Boston, MD;  Location: [facility];  Service: General;  Laterality: N/A;  . CESAREAN SECTION  2012  . COLONOSCOPY    . DIAGNOSTIC LAPAROSCOPY    . ESOPHAGOGASTRODUODENOSCOPY N/A 06/03/2012   Procedure: ESOPHAGOGASTRODUODENOSCOPY (EGD);  Surgeon: [name] Craver, MD;  Location: [facility] ENDOSCOPY;  Service: Endoscopy;  Laterality: N/A;  . EXCISION MASS ABDOMINAL N/A 05/25/2015   Procedure: ABDOMINAL WALL EXPLORATION EXCISION OF SEROMA REMOVAL OF REDUNDANT SKIN ;  Surgeon: Michael Boston, MD;  Location: [facility];  Service: General;  Laterality: N/A;  . EYE SURGERY     Eye laser for vessel hemorrhaging  . fybroid removal    . HERNIA REPAIR  10/03/11   ventral hernia repair  . INSERTION OF MESH N/A 02/04/2013   Procedure: INSERTION OF MESH;  Surgeon: Adin Hector, MD;  Location: [facility];  Service: General;  Laterality: N/A;  . UMBILICAL HERNIA REPAIR N/A 02/04/2013   Procedure: LAPAROSCOPIC ventral wall hernia repair LAPAROSCOPIC LYSIS OF ADHESIONS laparoscopic exploration of abdomen ;  Surgeon: Adin Hector, MD;  Location: [facility];  Service: General;  Laterality: N/A;  . UPPER GASTROINTESTINAL ENDOSCOPY    . URETER REVISION     Bilateral "twisted"  . UTERINE FIBROID SURGERY     2 SURGERIES FOR FIBROIDS  . VENTRAL HERNIA REPAIR  10/03/2011   Procedure: LAPAROSCOPIC VENTRAL HERNIA;  Surgeon: Adin Hector, MD;  Location: [facility];  Service: General;  Laterality: N/A;    Social History   Socioeconomic History  . Marital status: Married     Spouse name: Annie Main  . Number of children: 3  . Years of education: BA degree  . Highest education level: Not on file  Occupational History  . Occupation: stay at home mom    Employer: UNEMPLOYED  Social Needs  . Financial resource strain: Not on file  . Food insecurity    Worry: Never true    Inability: Never true  . Transportation needs    Medical: No    Non-medical: No  Tobacco Use  . Smoking status: Never Smoker  . Smokeless tobacco: Never Used  Substance and Sexual Activity  . Alcohol use: No    Alcohol/week: 0.0 standard drinks  . Drug use: No  .  Sexual activity: Yes    Birth control/protection: None  Lifestyle  . Physical activity    Days per week: Not on file    Minutes per session: Not on file  . Stress: Not on file  Relationships  . Social Herbalist on phone: Not on file    Gets together: Not on file    Attends religious service: Not on file    Active member of club or organization: Not on file    Attends meetings of clubs or organizations: Not on file    Relationship status: Not on file  . Intimate partner violence    Fear of current or ex partner: Not on file    Emotionally abused: Not on file    Physically abused: Not on file    Forced sexual activity: Not on file  Other Topics Concern  . Not on file  Social History Narrative   Patient is married with 2 children.   Patient is right handed.   Patient has her  BA degree.   Patient drinks 1 cup daily.    Review of Systems  Constitution: Positive for weight gain. Negative for decreased appetite, malaise/fatigue and weight loss.  Eyes: Negative for visual disturbance.  Cardiovascular: Positive for chest pain (worsened with lifting her left arm), dyspnea on exertion (over exertion) and leg swelling. Negative for claudication, orthopnea, palpitations and syncope.  Respiratory: Negative for hemoptysis and wheezing.   Endocrine: Negative for cold intolerance and heat intolerance.   Hematologic/Lymphatic: Does not bruise/bleed easily.  Skin: Negative for nail changes.  Musculoskeletal: Negative for muscle weakness and myalgias.  Gastrointestinal: Negative for abdominal pain, change in bowel habit, nausea and vomiting.  Neurological: Negative for difficulty with concentration, dizziness, focal weakness and headaches.  Psychiatric/Behavioral: Negative for altered mental status and suicidal ideas.  All other systems reviewed and are negative.     Objective:  Blood pressure (!) 149/78, pulse 86, height '5\' 6"'  (1.676 m), weight 236 lb (107 kg), SpO2 99 %. Body mass index is 38.09 kg/m.    Physical Exam  Constitutional: She is oriented to person, place, and time. Vital signs are normal. She appears well-developed and well-nourished.  HENT:  Head: Normocephalic and atraumatic.  Neck: Normal range of motion.  Cardiovascular: Normal rate, regular rhythm and intact distal pulses.  Murmur heard.  Harsh midsystolic murmur is present with a grade of 1/6 at the upper right sternal border. Trace leg edema Pulses:      Femoral pulses are 2+ on the right side and 2+ on the left side.      Popliteal pulses are 2+ on the right side and 2+ on the left side.       Dorsalis pedis pulses are 2+ on the right side and 2+ on the left side.       Posterior tibial pulses are 2+ on the right side and 2+ on the left side.  Pulmonary/Chest: Effort normal and breath sounds normal. No accessory muscle usage. No respiratory distress. She exhibits tenderness.  Abdominal: Soft. Bowel sounds are normal.  Musculoskeletal: Normal range of motion.  Neurological: She is alert and oriented to person, place, and time.  Skin: Skin is warm and dry.  Vitals reviewed.  Radiology: No results found.  Laboratory examination:    CMP Latest Ref Rng & Units 04/20/2018 11/03/2017 09/08/2017  Glucose 70 - 99 mg/dL 240(H) 128(H) -  BUN 6 - 20 mg/dL 22(H) 11 -  Creatinine 0.44 - 1.00 mg/dL  1.11(H) 0.70 -   Sodium 135 - 145 mmol/L 137 141 -  Potassium 3.5 - 5.1 mmol/L 3.6 3.7 -  Chloride 98 - 111 mmol/L 99 100 -  CO2 22 - 32 mmol/L 25 24 -  Calcium 8.9 - 10.3 mg/dL 10.1 9.1 -  Total Protein 6.5 - 8.1 g/dL 8.0 6.8 6.7  Total Bilirubin 0.3 - 1.2 mg/dL 0.8 0.3 -  Alkaline Phos 38 - 126 U/L 60 54 -  AST 15 - 41 U/L 18 14 -  ALT 0 - 44 U/L 14 13 -   CBC Latest Ref Rng & Units 04/20/2018 07/05/2017 05/11/2017  WBC 4.0 - 10.5 K/uL 13.1(H) 6.1 8.3  Hemoglobin 12.0 - 15.0 g/dL 13.5 12.1 12.3  Hematocrit 36.0 - 46.0 % 42.4 38.4 38.1  Platelets 150 - 400 K/uL 348 317 281   Lipid Panel     Component Value Date/Time   CHOL 169 02/15/2018 1034   TRIG 168 (H) 02/15/2018 1034   HDL 41 02/15/2018 1034   CHOLHDL 6.0 10/17/2011 0315   VLDL 59 (H) 10/17/2011 0315   LDLCALC 94 02/15/2018 1034   HEMOGLOBIN A1C Lab Results  Component Value Date   HGBA1C 8.5 (H) 02/15/2018   MPG 169 05/18/2015   TSH Recent Labs    11/03/17 1310  TSH 0.947    PRN Meds:. Medications Discontinued During This Encounter  Medication Reason  . hydrochlorothiazide (HYDRODIURIL) 50 MG tablet Error  . propranolol (INDERAL) 20 MG tablet Error  . SUPREP BOWEL PREP KIT 17.5-3.13-1.6 GM/177ML SOLN Error   Current Meds  Medication Sig  . allopurinol (ZYLOPRIM) 100 MG tablet Take 100 mg by mouth every evening.  Marland Kitchen amLODipine (NORVASC) 10 MG tablet TAKE 1 TABLET BY MOUTH EVERY DAY  . aspirin 325 MG tablet Take 325 mg by mouth at bedtime.   Marland Kitchen atorvastatin (LIPITOR) 10 MG tablet Take 1 tablet (10 mg total) by mouth daily.  . cetirizine (ZYRTEC) 10 MG tablet Take 10 mg by mouth daily as needed for allergies.   . cloNIDine (CATAPRES) 0.1 MG tablet Take 0.1 mg by mouth 2 (two) times daily.  . colchicine 0.6 MG tablet Take 0.6 mg by mouth daily as needed (For gout flare-up.).   Marland Kitchen EPINEPHrine 0.3 mg/0.3 mL IJ SOAJ injection INJECT AS DIRECTED AS NEEDED FOR ALLERGIC REACTION  . fluticasone (FLONASE) 50 MCG/ACT nasal spray daily as  needed.   . gabapentin (NEURONTIN) 600 MG tablet Take 600 mg by mouth 3 (three) times daily.   . insulin aspart (NOVOLOG) 100 UNIT/ML injection Inject 5-15 Units into the skin 3 (three) times daily with meals. Per sliding scale--pt uses Omnipod and gets a basal rate   . Insulin Glargine (BASAGLAR KWIKPEN) 100 UNIT/ML SOPN Inject 18 Units into the skin daily.   . Liraglutide (VICTOZA) 18 MG/3ML SOPN Inject 1.2 mg into the skin daily.  Marland Kitchen omeprazole (PRILOSEC) 40 MG capsule TAKE 1 CAPSULE BY MOUTH EVERY DAY  . ondansetron (ZOFRAN-ODT) 4 MG disintegrating tablet Take 1 tablet (4 mg total) by mouth every 8 (eight) hours as needed for nausea or vomiting. Dissolve under tongue  . spironolactone (ALDACTONE) 50 MG tablet Take 1 tablet (50 mg total) by mouth daily. (Patient taking differently: Take 100 mg by mouth 2 (two) times daily. )  . tiZANidine (ZANAFLEX) 2 MG tablet TAKE 1 TABLET AT BEDTIME DAILY. CAN TAKE 1/2 TABLET IN THE EVERY MORNING AS NEEDED  . traMADol (ULTRAM) 50 MG tablet TAKE 1 TABLET BY MOUTH 3  TIMES A DAY AS NEEDED (Patient taking differently: 2 (two) times daily. )  . Vitamin D, Ergocalciferol, (DRISDOL) 1.25 MG (50000 UT) CAPS capsule Take 1 capsule (50,000 Units total) by mouth every 7 (seven) days.    Cardiac Studies:   Echocardiogram 08/21/2017: Left ventricle cavity is normal in size. Mild concentric hypertrophy of the left ventricle. Normal global wall motion. Doppler evidence of grade II (pseudonormal) diastolic dysfunction, elevated LAP. Calculated EF 55%. Mildly restricted aortic valve leaflets with trace aortic valve stenosis. Aortic valve mean gradient of 8 mmHg, Vmax of 2.0 m/s. Calculated aortic valve area by continuity equation is 1.4 cm. No evidence of pulmonary hypertension.  Lexiscan myoview stress test 08/21/2017:  1. Lexiscan stress test was performed. Exercise capacity was not assessed. Resting BP 148/90 mmHg, peak effect BP 168/90 mmHg. Stress symptoms included  dizziness, nausea, headache, chest tightness.  2. The overall quality of the study is good. There is no evidence of abnormal lung activity. Stress and rest SPECT images demonstrate homogeneous tracer distribution throughout the myocardium. Gated SPECT imaging reveals normal myocardial thickening and wall motion. The left ventricular ejection fraction was normal calculated as 45%, although visually appears normal.  3. Low risk study.  ABI 08/21/2017: This exam reveals normal perfusion of both the lower extremity (RABI 1.27 and LABI 1.20 with biphasic waveform). ABI may be falsely elevated in patients with DM and medial calcinosis.  Assessment:   Chronic diastolic (congestive) heart failure (HCC) - Plan: EKG 12-Lead  Musculoskeletal chest pain  Essential hypertension  Obesity (BMI 30-39.9)  Leg edema  EKG 10/01/2018: Normal sinus rhythm at 82 bpm, normal axis, no evidence of ischemia.  Recommendations:   Patient is here for 54-monthoffice visit and follow-up for chronic diastolic heart failure.  She is presently doing well, she does admit to chest pain that is clearly suggestive of musculoskeletal etiology for last 2 to 3 weeks.  Encouraged her to use heat and ice to her chest wall as well as ibuprofen/Tylenol to help with her chest discomfort.  Dyspnea on exertion has remained stable.  She has started walking regularly and is tolerating this well, but does admit that she has been noncompliant with her diet recently and has not further lost any weight.  She is to follow-up with Dr. BOwens Sharknext week to further discussed management of her weight.  I discussed the importance of modifying her risk factors that are contributing to her diastolic dysfunction.  Her blood pressure is slightly elevated.  She has had leg edema that was worsened recently and has been taken off of her amlodipine by her PCP.  Her leg edema has been getting better since doing this.  Aldactone was increased to 100 mg daily.   I recommended that when she follow-up with her PCP in 2 weeks, kidney function be evaluated as well as her potassium level.  If blood pressure continues to be an issue, could consider addition of hydralazine.  Will defer to PCP.  Overall, patient is presently doing well.  I will see her back in 6 months or sooner if needed.  AMiquel Dunn MSN, APRN, FNP-C POrlando Health South Seminole HospitalCardiovascular. PRavendenOffice: [phone]: [phone]

## 2018-10-05 ENCOUNTER — Ambulatory Visit (INDEPENDENT_AMBULATORY_CARE_PROVIDER_SITE_OTHER): Payer: Federal, State, Local not specified - PPO | Admitting: Bariatrics

## 2018-10-05 ENCOUNTER — Other Ambulatory Visit: Payer: Self-pay

## 2018-10-05 ENCOUNTER — Encounter (INDEPENDENT_AMBULATORY_CARE_PROVIDER_SITE_OTHER): Payer: Self-pay | Admitting: Bariatrics

## 2018-10-05 VITALS — BP 134/82 | HR 88 | Temp 98.7°F | Ht 66.0 in | Wt 233.0 lb

## 2018-10-05 DIAGNOSIS — Z9189 Other specified personal risk factors, not elsewhere classified: Secondary | ICD-10-CM

## 2018-10-05 DIAGNOSIS — Z6836 Body mass index (BMI) 36.0-36.9, adult: Secondary | ICD-10-CM

## 2018-10-05 DIAGNOSIS — Z794 Long term (current) use of insulin: Secondary | ICD-10-CM | POA: Diagnosis not present

## 2018-10-05 DIAGNOSIS — E119 Type 2 diabetes mellitus without complications: Secondary | ICD-10-CM | POA: Diagnosis not present

## 2018-10-05 DIAGNOSIS — F3289 Other specified depressive episodes: Secondary | ICD-10-CM | POA: Diagnosis not present

## 2018-10-05 DIAGNOSIS — E559 Vitamin D deficiency, unspecified: Secondary | ICD-10-CM | POA: Diagnosis not present

## 2018-10-06 ENCOUNTER — Encounter (INDEPENDENT_AMBULATORY_CARE_PROVIDER_SITE_OTHER): Payer: Self-pay | Admitting: Bariatrics

## 2018-10-06 MED ORDER — BUPROPION HCL ER (SR) 150 MG PO TB12: 150.0000 mg | ORAL_TABLET | Freq: Every day | ORAL | 0 refills | Status: DC

## 2018-10-06 NOTE — Progress Notes (Signed)
Office: [phone]  /  Fax: [phone]   HPI:   Chief Complaint: OBESITY Tina [name] is here to discuss her progress with her obesity treatment plan. She is on the Category 3 plan and is following her eating plan approximately 40% of the time. She states she is walking 2-3 miles 5 times per week. Tina [name]'s weight has remained the same. She states she has not been eating at the right time. She reports trying to get adequate protein and states her water intake is adequate. Her weight is 233 lb (105.7 kg) today and has not lost weight since her last visit. She has lost 5 lbs since starting treatment with [name].  Diabetes II, Uncontrolled Tina [name] has a diagnosis of diabetes type II. Tina [name] states she is trying to stay in the range of 120-150 with her fasting blood sugars. Last A1c was 8.5 on 02/15/2018. She has been working on intensive lifestyle modifications including diet, exercise, and weight loss to help control her blood glucose levels.  Vitamin D deficiency Tina [name] has a diagnosis of Vitamin D deficiency. She is currently taking Vit D and denies nausea, vomiting or muscle weakness.  At risk for osteopenia and osteoporosis Tina [name] is at higher risk of osteopenia and osteoporosis due to Vitamin D deficiency.   Depression with emotional eating behaviors Tina [name] is struggling with emotional eating and using food for comfort to the extent that it is negatively impacting her health. She often snacks when she is not hungry. Tina [name] sometimes feels she is out of control and then feels guilty that she made poor food choices. She has been working on behavior modification techniques to help reduce her emotional eating and has been somewhat successful. She shows no sign of suicidal or homicidal ideations.  Depression screen Tina [name] 2/9 05/18/2018 05/13/2018 01/28/2018 01/01/2018 11/03/2017  Decreased Interest 3 1 1 1 2   Down, Depressed, Hopeless 2 0 0 1 3  PHQ - 2 Score 5 1 1 2 5   Altered sleeping 3 - - -  2  Tired, decreased energy 3 - - - 3  Change in appetite 3 - - - 3  Feeling bad or failure about yourself  3 - - - 3  Trouble concentrating 2 - - - 1  Moving slowly or fidgety/restless 0 - - - 0  Suicidal thoughts 0 - - - 1  PHQ-9 Score 19 - - - 18  Difficult doing work/chores - - - - Somewhat difficult  Some recent data might be hidden   ASSESSMENT AND PLAN:  Type 2 diabetes mellitus without complication, with long-term current use of insulin (HCC)  Vitamin D deficiency  Other depression - Plan: buPROPion (WELLBUTRIN SR) 150 MG 12 hr tablet  At risk for osteoporosis  Class 2 severe obesity with serious comorbidity and body mass index (BMI) of 36.0 to 36.9 in adult, unspecified obesity type (HCC)  PLAN:  Diabetes II, Uncontrolled Tina [name] has been given extensive diabetes education by myself today including ideal fasting and post-prandial blood glucose readings, individual ideal HgA1c goals  and hypoglycemia prevention. We discussed the importance of good blood sugar control to decrease the likelihood of diabetic complications such as nephropathy, neuropathy, limb loss, blindness, coronary artery disease, and death. We discussed the importance of intensive lifestyle modification including diet, exercise and weight loss as the first line treatment for diabetes. Tina [name] agrees to continue her diabetes medications and will follow-up at the agreed upon time.  Vitamin D Deficiency Tina [name] was informed that low Vitamin D levels contributes  to fatigue and are associated with obesity, breast, and colon cancer. She agrees to continue taking Vit D and will follow-up for routine testing of Vitamin D, at least 2-3 times per year. She was informed of the risk of over-replacement of Vitamin D and agrees to not increase her dose unless she discusses this with [name] first. Tina [name] agrees to follow-up with our clinic in 2 weeks.  At risk for osteopenia and osteoporosis Tina [name] was given extended  (15  minutes) osteoporosis prevention counseling today. Tina [name] is at risk for osteopenia and osteoporosis due to her Vitamin D deficiency. She was encouraged to take her Vitamin D and follow her higher calcium diet and increase strengthening exercise to help strengthen her bones and decrease her risk of osteopenia and osteoporosis.  Depression with Emotional Eating Behaviors We discussed behavior modification techniques today to help Tina [name] deal with her emotional eating and depression. Tina [name] was given a prescription for Wellbutrin 150 mg 1 PO QD #30 with 0 refills. She agrees to follow-up with our clinic in 2 weeks.  Obesity Tina [name] is currently in the action stage of change. As such, her goal is to continue with weight loss efforts. She has agreed to follow the Category 3 plan. Tina [name] will work on meal planning, intentional eating, and will not skip meals. Tina [name] has been instructed to work up to a goal of 150 minutes of combined cardio and strengthening exercise per week for weight loss and overall health benefits. We discussed the following Behavioral Modification Strategies today: increasing lean protein intake, decreasing simple carbohydrates, increasing vegetables, increase H20 intake, decrease eating out, no skipping meals, work on meal planning and easy cooking plans, and keeping healthy foods in the home.  Tina [name] has agreed to follow up with our clinic in 2 weeks. She was informed of the importance of frequent follow up visits to maximize her success with intensive lifestyle modifications for her multiple health conditions.  ALLERGIES: Allergies  Allergen Reactions   Contrast Media [Iodinated Diagnostic Agents] Other (See Comments)    Difficulty breathing   Iohexol Hives, Nausea And Vomiting and Swelling     Desc: Magnevist-gadolinium-difficulty breathing, throat swelling    Midazolam Hcl Anaphylaxis    Difficulty breathing   Shellfish Allergy Anaphylaxis   Valsartan  Swelling   Metformin And Related Diarrhea and Nausea And Vomiting   Other Itching    Patient is allergic to all nuts except peanuts.    Avandia [Rosiglitazone Maleate] Hives and Other (See Comments)   Geodon [Ziprasidone] Other (See Comments)    UNKNOWN   Kiwi Extract Itching and Swelling   Latex Itching    MEDICATIONS: Current Outpatient Medications on File Prior to Visit  Medication Sig Dispense Refill   allopurinol (ZYLOPRIM) 100 MG tablet Take 100 mg by mouth every evening.     amLODipine (NORVASC) 10 MG tablet TAKE 1 TABLET BY MOUTH EVERY DAY 90 tablet 1   aspirin 325 MG tablet Take 325 mg by mouth at bedtime.      atorvastatin (LIPITOR) 10 MG tablet Take 1 tablet (10 mg total) by mouth daily. 30 tablet 0   cetirizine (ZYRTEC) 10 MG tablet Take 10 mg by mouth daily as needed for allergies.   11   cloNIDine (CATAPRES) 0.1 MG tablet Take 0.1 mg by mouth 2 (two) times daily.  0   colchicine 0.6 MG tablet Take 0.6 mg by mouth daily as needed (For gout flare-up.).   1   EPINEPHrine 0.3 mg/0.3 mL IJ SOAJ injection  INJECT AS DIRECTED AS NEEDED FOR ALLERGIC REACTION 1 Device 2   fluticasone (FLONASE) 50 MCG/ACT nasal spray daily as needed.      gabapentin (NEURONTIN) 600 MG tablet Take 600 mg by mouth 3 (three) times daily.      insulin aspart (NOVOLOG) 100 UNIT/ML injection Inject 5-15 Units into the skin 3 (three) times daily with meals. Per sliding scale--pt uses Omnipod and gets a basal rate      Insulin Glargine (BASAGLAR KWIKPEN) 100 UNIT/ML SOPN Inject 18 Units into the skin daily.      Liraglutide (VICTOZA) 18 MG/3ML SOPN Inject 1.2 mg into the skin daily.     omeprazole (PRILOSEC) 40 MG capsule TAKE 1 CAPSULE BY MOUTH EVERY DAY 90 capsule 1   ondansetron (ZOFRAN-ODT) 4 MG disintegrating tablet Take 1 tablet (4 mg total) by mouth every 8 (eight) hours as needed for nausea or vomiting. Dissolve under tongue 90 tablet 1   spironolactone (ALDACTONE) 50 MG tablet  Take 1 tablet (50 mg total) by mouth daily. (Patient taking differently: Take 100 mg by mouth 2 (two) times daily. ) 90 tablet 3   tiZANidine (ZANAFLEX) 2 MG tablet TAKE 1 TABLET AT BEDTIME DAILY. CAN TAKE 1/2 TABLET IN THE EVERY MORNING AS NEEDED 135 tablet 1   traMADol (ULTRAM) 50 MG tablet TAKE 1 TABLET BY MOUTH 3 TIMES A DAY AS NEEDED (Patient taking differently: 2 (two) times daily. ) 90 tablet 3   Vitamin D, Ergocalciferol, (DRISDOL) 1.25 MG (50000 UT) CAPS capsule Take 1 capsule (50,000 Units total) by mouth every 7 (seven) days. 4 capsule 0   No current facility-administered medications on file prior to visit.     PAST MEDICAL HISTORY: Past Medical History:  Diagnosis Date   Allergy    Anemia    DURING MENSES--HAS HEAVY BLEEDING WITH PERODS   Anxiety    Back pain, chronic    "ongoing"   Blood transfusion    IN 2012  AFTER C -SECTION   Cerebral thrombosis with cerebral infarction [facility]) JUNE 2011   RIGHT SIDED WEAKNESS ( ARM AND LEG ) AND SPASMS-remains with slight weakness and vertigo.   Constipation    Depression    Diabetes mellitus    Diabetic neuropathy (HCC)    BOTH FEET --COMES AND GOES   Edema, lower extremity    Endometriosis    Fatty liver    GERD (gastroesophageal reflux disease)    with pregnancy   H/O eye surgery    Headache(784.0)    MIGRAINES--NOT REALLY HEADACHE-MORE LIKE PRESSURE SENSATION IN HEAD-FEELS DIZZIY AND  FAINT AS THE PRESSURE RESOLVES   Hernia, incisional, RLQ, s/p lap repair Sep 2013 09/02/2011   History of vertigo 03/22/2018   Hx of migraines 10/19/2011   Hypertension    Leg pain, right    "like bad Charley horse"   Multiple food allergies    Panic disorder without agoraphobia    Rash    HANDS, ARMS --STATES HX OF RASH EVER SINCE CHILDBIRTH/PREGNANCY.  STATES THE RASH OFTEN OCCURS WHEN SHE IS REALLY STRESSED."goes and comes-presently left ring finger"   Restless leg syndrome    DIAGNOSED BY SLEEP STUDY - PT TOLD  SHE DID NOT HAVE SLEEP APNEA   Right rotator cuff tear    PAIN IN RIGHT SHOULDER   SBO (small bowel obstruction) ([place]) 06/03/2012   Shortness of breath    Spastic hemiplegia affecting dominant side ([place])    Stomach problems    Stroke ([place])  Ventral hernia    RIGHT LOWER QUADRANT-CAUSING SOME PAIN   Weakness of right side of body     PAST SURGICAL HISTORY: Past Surgical History:  Procedure Laterality Date   APPLICATION OF WOUND VAC N/A 05/25/2015   Procedure: APPLICATION OF WOUND VAC;  Surgeon: Michael Boston, MD;  Location: [facility];  Service: General;  Laterality: N/A;   CESAREAN SECTION  2012   COLONOSCOPY     DIAGNOSTIC LAPAROSCOPY     ESOPHAGOGASTRODUODENOSCOPY N/A 06/03/2012   Procedure: ESOPHAGOGASTRODUODENOSCOPY (EGD);  Surgeon: [name] Craver, MD;  Location[facility] ENDOSCOPY;  Service: Endoscopy;  Laterality: N/A;   EXCISION MASS ABDOMINAL N/A 05/25/2015   Procedure: ABDOMINAL WALL EXPLORATION EXCISION OF SEROMA REMOVAL OF REDUNDANT SKIN ;  Surgeon: Michael Boston, MD;  Location: [facility];  Service: General;  Laterality: N/A;   EYE SURGERY     Eye laser for vessel hemorrhaging   fybroid removal     HERNIA REPAIR  10/03/11   ventral hernia repair   INSERTION OF MESH N/A 02/04/2013   Procedure: INSERTION OF MESH;  Surgeon: Adin Hector, MD;  Location: [facility];  Service: General;  Laterality: N/A;   UMBILICAL HERNIA REPAIR N/A 02/04/2013   Procedure: LAPAROSCOPIC ventral wall hernia repair LAPAROSCOPIC LYSIS OF ADHESIONS laparoscopic exploration of abdomen ;  Surgeon: Adin Hector, MD;  Location: [facility];  Service: General;  Laterality: N/A;   UPPER GASTROINTESTINAL ENDOSCOPY     URETER REVISION     Bilateral "twisted"   UTERINE FIBROID SURGERY     2 SURGERIES FOR FIBROIDS   VENTRAL HERNIA REPAIR  10/03/2011   Procedure: LAPAROSCOPIC VENTRAL HERNIA;  Surgeon: Adin Hector, MD;  Location: [facility];  Service: General;  Laterality: N/A;    SOCIAL HISTORY: Social  History   Tobacco Use   Smoking status: Never Smoker   Smokeless tobacco: Never Used  Substance Use Topics   Alcohol use: No    Alcohol/week: 0.0 standard drinks   Drug use: No    FAMILY HISTORY: Family History  Problem Relation Age of Onset   Diabetes Father    Kidney disease Father    Depression Father    Drug abuse Father    Allergic rhinitis Mother    Eczema Mother    Urticaria Mother    Depression Mother    Anxiety disorder Mother    Bipolar disorder Mother    Alcoholism Mother    Drug abuse Mother    Eating disorder Mother    Diabetes Maternal Grandmother    Hyperlipidemia Paternal Grandmother    Stroke Paternal Grandmother    Eczema Sister    Urticaria Sister    Colon cancer Paternal Uncle    Other Neg Hx    Angioedema Neg Hx    Asthma Neg Hx    Colon polyps Neg Hx    Esophageal cancer Neg Hx    Rectal cancer Neg Hx    Stomach cancer Neg Hx    ROS: Review of Systems  Gastrointestinal: Negative for nausea and vomiting.  Musculoskeletal:       Negative for muscle weakness.  Psychiatric/Behavioral: Positive for depression (emotional eating). Negative for suicidal ideas.       Negative for homicidal ideas.   PHYSICAL EXAM: Blood pressure 134/82, pulse 88, temperature 98.7 F (37.1 C), temperature source Oral, height 5\' 6"  (1.676 m), weight 233 lb (105.7 kg), last menstrual period 09/28/2018, SpO2 (!) 11 %. Body mass index is 37.61 kg/m. Physical Exam Vitals signs  reviewed.  Constitutional:      Appearance: Normal appearance. She is obese.  Cardiovascular:     Rate and Rhythm: Normal rate.     Pulses: Normal pulses.  Pulmonary:     Effort: Pulmonary effort is normal.     Breath sounds: Normal breath sounds.  Musculoskeletal: Normal range of motion.  Skin:    General: Skin is warm and dry.  Neurological:     Mental Status: She is alert and oriented to person, place, and time.  Psychiatric:        Behavior: Behavior  normal.   RECENT LABS AND TESTS: BMET    Component Value Date/Time   NA 137 04/20/2018 1537   NA 141 11/03/2017 1310   K 3.6 04/20/2018 1537   CL 99 04/20/2018 1537   CO2 25 04/20/2018 1537   GLUCOSE 240 (H) 04/20/2018 1537   BUN 22 (H) 04/20/2018 1537   BUN 11 11/03/2017 1310   CREATININE 1.11 (H) 04/20/2018 1537   CALCIUM 10.1 04/20/2018 1537   GFRNONAA 59 (L) 04/20/2018 1537   GFRAA >60 04/20/2018 1537   Lab Results  Component Value Date   HGBA1C 8.5 (H) 02/15/2018   HGBA1C 8.4 (H) 11/03/2017   HGBA1C 7.5 (H) 05/18/2015   HGBA1C 8.7 (H) 02/05/2013   HGBA1C 6.9 (H) 06/03/2012   No results found for: INSULIN CBC    Component Value Date/Time   WBC 13.1 (H) 04/20/2018 1537   RBC 5.26 (H) 04/20/2018 1537   HGB 13.5 04/20/2018 1537   HCT 42.4 04/20/2018 1537   PLT 348 04/20/2018 1537   MCV 80.6 04/20/2018 1537   MCH 25.7 (L) 04/20/2018 1537   MCHC 31.8 04/20/2018 1537   RDW 14.6 04/20/2018 1537   LYMPHSABS 1.3 05/11/2017 1323   MONOABS 0.6 05/11/2017 1323   EOSABS 0.2 05/11/2017 1323   BASOSABS 0.0 05/11/2017 1323   Iron/TIBC/Ferritin/ %Sat    Component Value Date/Time   IRON 114 11/01/2015 1031   TIBC 372 11/01/2015 1031   FERRITIN 35 11/01/2015 1031   IRONPCTSAT 31 11/01/2015 1031   Lipid Panel     Component Value Date/Time   CHOL 169 02/15/2018 1034   TRIG 168 (H) 02/15/2018 1034   HDL 41 02/15/2018 1034   CHOLHDL 6.0 10/17/2011 0315   VLDL 59 (H) 10/17/2011 0315   LDLCALC 94 02/15/2018 1034   Hepatic Function Panel     Component Value Date/Time   PROT 8.0 04/20/2018 1537   PROT 6.8 11/03/2017 1310   ALBUMIN 3.7 04/20/2018 1537   ALBUMIN 3.8 11/03/2017 1310   AST 18 04/20/2018 1537   ALT 14 04/20/2018 1537   ALKPHOS 60 04/20/2018 1537   BILITOT 0.8 04/20/2018 1537   BILITOT 0.3 11/03/2017 1310   BILIDIR <0.1 06/03/2012 0220   IBILI NOT CALCULATED 06/03/2012 0220      Component Value Date/Time   TSH 0.947 11/03/2017 1310   TSH 2.020  09/08/2017 1129   TSH 1.045 06/03/2012 0520   Results for JAYLE, LOBLEY (MRN [id number]) as of 10/06/2018 10:47  Ref. Range 02/15/2018 10:34  Vitamin D, 25-Hydroxy Latest Ref Range: 30.0 - 100.0 ng/mL 26.5 (L)   OBESITY BEHAVIORAL INTERVENTION VISIT  Today's visit was #22  Starting weight: 238 lbs Starting date: 11/03/2017 Today's weight: 233 lbs  Today's date: 10/05/2018 Total lbs lost to date: 5    10/05/2018  Height 5\' 6"  (1.676 m)  Weight 233 lb (105.7 kg)  BMI (Calculated) 37.63  BLOOD PRESSURE - SYSTOLIC  134  BLOOD PRESSURE - DIASTOLIC 82   Body Fat % [id number] %  Total Body Water (lbs) 95.6 lbs   ASK: We discussed the diagnosis of obesity with Tina [name] Carmelia Roller today and Barbra agreed to give [name] permission to discuss obesity behavioral modification therapy today.  ASSESS: Tina [name] has the diagnosis of obesity and her BMI today is 36.5. Jashanti is in the action stage of change.   ADVISE: Azalynn was educated on the multiple health risks of obesity as well as the benefit of weight loss to improve her health. She was advised of the need for long term treatment and the importance of lifestyle modifications to improve her current health and to decrease her risk of future health problems.  AGREE: Multiple dietary modification options and treatment options were discussed and  Geonna agreed to follow the recommendations documented in the above note.  ARRANGE: Jeyda was educated on the importance of frequent visits to treat obesity as outlined per CMS and USPSTF guidelines and agreed to schedule her next follow up appointment today.  Migdalia Dk, am acting as Location manager for [employer], DO   I have reviewed the above documentation for accuracy and completeness, and I agree with the above. -Jearld Lesch, DO

## 2018-10-18 DIAGNOSIS — G2581 Restless legs syndrome: Secondary | ICD-10-CM | POA: Diagnosis not present

## 2018-10-18 DIAGNOSIS — E1142 Type 2 diabetes mellitus with diabetic polyneuropathy: Secondary | ICD-10-CM | POA: Diagnosis not present

## 2018-10-18 DIAGNOSIS — M7989 Other specified soft tissue disorders: Secondary | ICD-10-CM | POA: Diagnosis not present

## 2018-10-18 DIAGNOSIS — I10 Essential (primary) hypertension: Secondary | ICD-10-CM | POA: Diagnosis not present

## 2018-10-19 DIAGNOSIS — H3561 Retinal hemorrhage, right eye: Secondary | ICD-10-CM | POA: Diagnosis not present

## 2018-10-19 DIAGNOSIS — H26491 Other secondary cataract, right eye: Secondary | ICD-10-CM | POA: Diagnosis not present

## 2018-10-19 DIAGNOSIS — E113552 Type 2 diabetes mellitus with stable proliferative diabetic retinopathy, left eye: Secondary | ICD-10-CM | POA: Diagnosis not present

## 2018-10-19 DIAGNOSIS — E113511 Type 2 diabetes mellitus with proliferative diabetic retinopathy with macular edema, right eye: Secondary | ICD-10-CM | POA: Diagnosis not present

## 2018-10-20 ENCOUNTER — Encounter (INDEPENDENT_AMBULATORY_CARE_PROVIDER_SITE_OTHER): Payer: Self-pay | Admitting: Bariatrics

## 2018-10-20 ENCOUNTER — Ambulatory Visit (INDEPENDENT_AMBULATORY_CARE_PROVIDER_SITE_OTHER): Payer: HMO | Admitting: Bariatrics

## 2018-10-20 ENCOUNTER — Other Ambulatory Visit: Payer: Self-pay

## 2018-10-20 VITALS — BP 132/84 | HR 84 | Temp 97.9°F | Ht 66.0 in | Wt 227.0 lb

## 2018-10-20 DIAGNOSIS — E559 Vitamin D deficiency, unspecified: Secondary | ICD-10-CM

## 2018-10-20 DIAGNOSIS — E119 Type 2 diabetes mellitus without complications: Secondary | ICD-10-CM | POA: Diagnosis not present

## 2018-10-20 DIAGNOSIS — Z6836 Body mass index (BMI) 36.0-36.9, adult: Secondary | ICD-10-CM

## 2018-10-20 DIAGNOSIS — Z9189 Other specified personal risk factors, not elsewhere classified: Secondary | ICD-10-CM

## 2018-10-20 DIAGNOSIS — F3289 Other specified depressive episodes: Secondary | ICD-10-CM

## 2018-10-20 MED ORDER — VITAMIN D (ERGOCALCIFEROL) 1.25 MG (50000 UNIT) PO CAPS: 50000.0000 [IU] | ORAL_CAPSULE | ORAL | 0 refills | Status: DC

## 2018-10-20 NOTE — Progress Notes (Signed)
Office: [phone]  /  Fax: [phone]   HPI:   Chief Complaint: OBESITY Tina [name] is here to discuss her progress with her obesity treatment plan. She is on the Category 3 plan and is following her eating plan approximately 70% of the time. She states she is exercising 0 minutes 0 times per week. Tina [name] has lost 6 lbs since her last visit and is doing well overall. She is doing better with her protein intake. Her weight is 227 lb (103 kg) today and has had a weight loss of 6 pounds over a period of 2 weeks since her last visit. She has lost 11 lbs since starting treatment with [name].  Vitamin D deficiency Tina [name] has a diagnosis of Vitamin D deficiency. She is currently taking prescription Vit D and denies nausea, vomiting or muscle weakness.  At risk for osteopenia and osteoporosis Tina [name] is at higher risk of osteopenia and osteoporosis due to vitamin D deficiency.   Diabetes II Tina [name] has a diagnosis of diabetes type II, which has been uncontrolled. Tina [name] does not report checking her blood sugars but reports no major lows. Last A1c was 8.5 on 02/15/2018. She has been working on intensive lifestyle modifications including diet, exercise, and weight loss to help control her blood glucose levels.  Depression with emotional eating behaviors Tina [name] is struggling with emotional eating and using food for comfort to the extent that it is negatively impacting her health. She often snacks when she is not hungry. Tina [name] sometimes feels she is out of control and then feels guilty that she made poor food choices. She has been working on behavior modification techniques to help reduce her emotional eating and has been somewhat successful. Tina [name] is taking Wellbutrin and shows no sign of suicidal or homicidal ideations. She reports decreased cravings for starchy foods.  Depression screen [facility] 2/9 05/18/2018 05/13/2018 01/28/2018 01/01/2018 11/03/2017  Decreased Interest 3 1 1 1 2   Down, Depressed,  Hopeless 2 0 0 1 3  PHQ - 2 Score 5 1 1 2 5   Altered sleeping 3 - - - 2  Tired, decreased energy 3 - - - 3  Change in appetite 3 - - - 3  Feeling bad or failure about yourself  3 - - - 3  Trouble concentrating 2 - - - 1  Moving slowly or fidgety/restless 0 - - - 0  Suicidal thoughts 0 - - - 1  PHQ-9 Score 19 - - - 18  Difficult doing work/chores - - - - Somewhat difficult  Some recent data might be hidden   .ASSESSMENT AND PLAN:  Vitamin D deficiency - Plan: VITAMIN D 25 Hydroxy (Vit-D Deficiency, Fractures), Vitamin D, Ergocalciferol, (DRISDOL) 1.25 MG (50000 UT) CAPS capsule  Type 2 diabetes mellitus without complication, without long-term current use of insulin (HCC) - Plan: Hemoglobin A1c  Other depression  At risk for osteoporosis  Class 2 severe obesity with serious comorbidity and body mass index (BMI) of 36.0 to 36.9 in adult, unspecified obesity type ([place])  PLAN:  Vitamin D Deficiency Tina [name] was informed that low Vitamin D levels contributes to fatigue and are associated with obesity, breast, and colon cancer. She agrees to continue to take prescription Vit D @ 50,000 IU every week #4 with 0 refills and will follow-up for routine testing of Vitamin D, at least 2-3 times per year. She was informed of the risk of over-replacement of Vitamin D and agrees to not increase her dose unless she discusses this with [name] first.  Tina [name] agrees to follow-up with our clinic in 2 weeks.  At risk for osteopenia and osteoporosis Tina [name] was given extended  (15 minutes) osteoporosis prevention counseling today. Tina [name] is at risk for osteopenia and osteoporosis due to her Vitamin D deficiency. She was encouraged to take her Vitamin D and follow her higher calcium diet and increase strengthening exercise to help strengthen her bones and decrease her risk of osteopenia and osteoporosis.  Diabetes II Tina [name] has been given extensive diabetes education by myself today including ideal fasting  and post-prandial blood glucose readings, individual ideal HgA1c goals  and hypoglycemia prevention. We discussed the importance of good blood sugar control to decrease the likelihood of diabetic complications such as nephropathy, neuropathy, limb loss, blindness, coronary artery disease, and death. We discussed the importance of intensive lifestyle modification including diet, exercise and weight loss as the first line treatment for diabetes. Tina [name] has been advised to decrease carbohydrates and increase protein. She will follow-up as directed to monitor her progress.  Depression with Emotional Eating Behaviors We discussed behavior modification techniques today to help Tina [name] deal with her emotional eating and depression. Tina [name] will continue Wellbutrin and follow-up as directed.  Obesity Tina [name] is currently in the action stage of change. As such, her goal is to continue with weight loss efforts. She has agreed to follow the Category 3 plan. Tina [name] will work on meal planning, intentional eating, and eat more fruits and vegetables. Tina [name] has been instructed to be as active as possible for weight loss and overall health benefits. We discussed the following Behavioral Modification Strategies today: increasing lean protein intake, decreasing simple carbohydrates, increasing vegetables, increase H20 intake, decrease eating out, no skipping meals, work on meal planning and easy cooking plans, keeping healthy foods in the home, and planning for success.  Tina [name] has agreed to follow-up with our clinic in 2 weeks. She was informed of the importance of frequent follow-up visits to maximize her success with intensive lifestyle modifications for her multiple health conditions.  ALLERGIES: Allergies  Allergen Reactions   Contrast Media [Iodinated Diagnostic Agents] Other (See Comments)    Difficulty breathing   Iohexol Hives, Nausea And Vomiting and Swelling     Desc:  Magnevist-gadolinium-difficulty breathing, throat swelling    Midazolam Hcl Anaphylaxis    Difficulty breathing   Shellfish Allergy Anaphylaxis   Valsartan Swelling   Metformin And Related Diarrhea and Nausea And Vomiting   Other Itching    Patient is allergic to all nuts except peanuts.    Avandia [Rosiglitazone Maleate] Hives and Other (See Comments)   Geodon [Ziprasidone] Other (See Comments)    UNKNOWN   Kiwi Extract Itching and Swelling   Latex Itching    MEDICATIONS: Current Outpatient Medications on File Prior to Visit  Medication Sig Dispense Refill   allopurinol (ZYLOPRIM) 100 MG tablet Take 100 mg by mouth every evening.     amLODipine (NORVASC) 10 MG tablet TAKE 1 TABLET BY MOUTH EVERY DAY 90 tablet 1   aspirin 325 MG tablet Take 325 mg by mouth at bedtime.      atorvastatin (LIPITOR) 10 MG tablet Take 1 tablet (10 mg total) by mouth daily. 30 tablet 0   buPROPion (WELLBUTRIN SR) 150 MG 12 hr tablet Take 1 tablet (150 mg total) by mouth daily. 30 tablet 0   cetirizine (ZYRTEC) 10 MG tablet Take 10 mg by mouth daily as needed for allergies.   11   cloNIDine (CATAPRES) 0.1 MG tablet Take 0.1 mg by mouth 2 (  two) times daily.  0   colchicine 0.6 MG tablet Take 0.6 mg by mouth daily as needed (For gout flare-up.).   1   EPINEPHrine 0.3 mg/0.3 mL IJ SOAJ injection INJECT AS DIRECTED AS NEEDED FOR ALLERGIC REACTION 1 Device 2   fluticasone (FLONASE) 50 MCG/ACT nasal spray daily as needed.      gabapentin (NEURONTIN) 600 MG tablet Take 600 mg by mouth 3 (three) times daily.      insulin aspart (NOVOLOG) 100 UNIT/ML injection Inject 5-15 Units into the skin 3 (three) times daily with meals. Per sliding scale--pt uses Omnipod and gets a basal rate      Insulin Glargine (BASAGLAR KWIKPEN) 100 UNIT/ML SOPN Inject 18 Units into the skin daily.      Liraglutide (VICTOZA) 18 MG/3ML SOPN Inject 1.2 mg into the skin daily.     omeprazole (PRILOSEC) 40 MG capsule  TAKE 1 CAPSULE BY MOUTH EVERY DAY 90 capsule 1   ondansetron (ZOFRAN-ODT) 4 MG disintegrating tablet Take 1 tablet (4 mg total) by mouth every 8 (eight) hours as needed for nausea or vomiting. Dissolve under tongue 90 tablet 1   spironolactone (ALDACTONE) 50 MG tablet Take 1 tablet (50 mg total) by mouth daily. (Patient taking differently: Take 100 mg by mouth 2 (two) times daily. ) 90 tablet 3   tiZANidine (ZANAFLEX) 2 MG tablet TAKE 1 TABLET AT BEDTIME DAILY. CAN TAKE 1/2 TABLET IN THE EVERY MORNING AS NEEDED 135 tablet 1   traMADol (ULTRAM) 50 MG tablet TAKE 1 TABLET BY MOUTH 3 TIMES A DAY AS NEEDED (Patient taking differently: 2 (two) times daily. ) 90 tablet 3   No current facility-administered medications on file prior to visit.     PAST MEDICAL HISTORY: Past Medical History:  Diagnosis Date   Allergy    Anemia    DURING MENSES--HAS HEAVY BLEEDING WITH PERODS   Anxiety    Back pain, chronic    "ongoing"   Blood transfusion    IN 2012  AFTER C -SECTION   Cerebral thrombosis with cerebral infarction [facility]) JUNE 2011   RIGHT SIDED WEAKNESS ( ARM AND LEG ) AND SPASMS-remains with slight weakness and vertigo.   Constipation    Depression    Diabetes mellitus    Diabetic neuropathy (HCC)    BOTH FEET --COMES AND GOES   Edema, lower extremity    Endometriosis    Fatty liver    GERD (gastroesophageal reflux disease)    with pregnancy   H/O eye surgery    Headache(784.0)    MIGRAINES--NOT REALLY HEADACHE-MORE LIKE PRESSURE SENSATION IN HEAD-FEELS DIZZIY AND  FAINT AS THE PRESSURE RESOLVES   Hernia, incisional, RLQ, s/p lap repair Sep 2013 09/02/2011   History of vertigo 03/22/2018   Hx of migraines 10/19/2011   Hypertension    Leg pain, right    "like bad Charley horse"   Multiple food allergies    Panic disorder without agoraphobia    Rash    HANDS, ARMS --STATES HX OF RASH EVER SINCE CHILDBIRTH/PREGNANCY.  STATES THE RASH OFTEN OCCURS WHEN SHE IS  REALLY STRESSED."goes and comes-presently left ring finger"   Restless leg syndrome    DIAGNOSED BY SLEEP STUDY - PT TOLD SHE DID NOT HAVE SLEEP APNEA   Right rotator cuff tear    PAIN IN RIGHT SHOULDER   SBO (small bowel obstruction) ([place]) 06/03/2012   Shortness of breath    Spastic hemiplegia affecting dominant side (HCC)    Stomach  problems    Stroke ([place])    Ventral hernia    RIGHT LOWER QUADRANT-CAUSING SOME PAIN   Weakness of right side of body     PAST SURGICAL HISTORY: Past Surgical History:  Procedure Laterality Date   APPLICATION OF WOUND VAC N/A 05/25/2015   Procedure: APPLICATION OF WOUND VAC;  Surgeon: Michael Boston, MD;  Location: [facility];  Service: General;  Laterality: N/A;   CESAREAN SECTION  2012   COLONOSCOPY     DIAGNOSTIC LAPAROSCOPY     ESOPHAGOGASTRODUODENOSCOPY N/A 06/03/2012   Procedure: ESOPHAGOGASTRODUODENOSCOPY (EGD);  Surgeon: [name] Craver, MD;  Location: [facility] ENDOSCOPY;  Service: Endoscopy;  Laterality: N/A;   EXCISION MASS ABDOMINAL N/A 05/25/2015   Procedure: ABDOMINAL WALL EXPLORATION EXCISION OF SEROMA REMOVAL OF REDUNDANT SKIN ;  Surgeon: Michael Boston, MD;  Location: [facility];  Service: General;  Laterality: N/A;   EYE SURGERY     Eye laser for vessel hemorrhaging   fybroid removal     HERNIA REPAIR  10/03/11   ventral hernia repair   INSERTION OF MESH N/A 02/04/2013   Procedure: INSERTION OF MESH;  Surgeon: Adin Hector, MD;  Location: [facility];  Service: General;  Laterality: N/A;   UMBILICAL HERNIA REPAIR N/A 02/04/2013   Procedure: LAPAROSCOPIC ventral wall hernia repair LAPAROSCOPIC LYSIS OF ADHESIONS laparoscopic exploration of abdomen ;  Surgeon: Adin Hector, MD;  Location: [facility];  Service: General;  Laterality: N/A;   UPPER GASTROINTESTINAL ENDOSCOPY     URETER REVISION     Bilateral "twisted"   UTERINE FIBROID SURGERY     2 SURGERIES FOR FIBROIDS   VENTRAL HERNIA REPAIR  10/03/2011   Procedure: LAPAROSCOPIC VENTRAL  HERNIA;  Surgeon: Adin Hector, MD;  Location: [facility];  Service: General;  Laterality: N/A;    SOCIAL HISTORY: Social History   Tobacco Use   Smoking status: Never Smoker   Smokeless tobacco: Never Used  Substance Use Topics   Alcohol use: No    Alcohol/week: 0.0 standard drinks   Drug use: No    FAMILY HISTORY: Family History  Problem Relation Age of Onset   Diabetes Father    Kidney disease Father    Depression Father    Drug abuse Father    Allergic rhinitis Mother    Eczema Mother    Urticaria Mother    Depression Mother    Anxiety disorder Mother    Bipolar disorder Mother    Alcoholism Mother    Drug abuse Mother    Eating disorder Mother    Diabetes Maternal Grandmother    Hyperlipidemia Paternal Grandmother    Stroke Paternal Grandmother    Eczema Sister    Urticaria Sister    Colon cancer Paternal Uncle    Other Neg Hx    Angioedema Neg Hx    Asthma Neg Hx    Colon polyps Neg Hx    Esophageal cancer Neg Hx    Rectal cancer Neg Hx    Stomach cancer Neg Hx    ROS: Review of Systems  Gastrointestinal: Negative for nausea and vomiting.  Musculoskeletal:       Negative for muscle weakness.  Psychiatric/Behavioral: Positive for depression (emotional eating). Negative for suicidal ideas.       Negative for homicidal ideas.   PHYSICAL EXAM: Blood pressure 132/84, pulse 84, temperature 97.9 F (36.6 C), temperature source Oral, height 5\' 6"  (1.676 m), weight 227 lb (103 kg), last menstrual period 09/28/2018, SpO2 100 %. Body mass  index is 36.64 kg/m. Physical Exam Vitals signs reviewed.  Constitutional:      Appearance: Normal appearance. She is obese.  Cardiovascular:     Rate and Rhythm: Normal rate.     Pulses: Normal pulses.  Pulmonary:     Effort: Pulmonary effort is normal.     Breath sounds: Normal breath sounds.  Musculoskeletal: Normal range of motion.  Skin:    General: Skin is warm and dry.    Neurological:     Mental Status: She is alert and oriented to person, place, and time.  Psychiatric:        Behavior: Behavior normal.   RECENT LABS AND TESTS: BMET    Component Value Date/Time   NA 137 04/20/2018 1537   NA 141 11/03/2017 1310   K 3.6 04/20/2018 1537   CL 99 04/20/2018 1537   CO2 25 04/20/2018 1537   GLUCOSE 240 (H) 04/20/2018 1537   BUN 22 (H) 04/20/2018 1537   BUN 11 11/03/2017 1310   CREATININE 1.11 (H) 04/20/2018 1537   CALCIUM 10.1 04/20/2018 1537   GFRNONAA 59 (L) 04/20/2018 1537   GFRAA >60 04/20/2018 1537   Lab Results  Component Value Date   HGBA1C 8.5 (H) 02/15/2018   HGBA1C 8.4 (H) 11/03/2017   HGBA1C 7.5 (H) 05/18/2015   HGBA1C 8.7 (H) 02/05/2013   HGBA1C 6.9 (H) 06/03/2012   No results found for: INSULIN CBC    Component Value Date/Time   WBC 13.1 (H) 04/20/2018 1537   RBC 5.26 (H) 04/20/2018 1537   HGB 13.5 04/20/2018 1537   HCT 42.4 04/20/2018 1537   PLT 348 04/20/2018 1537   MCV 80.6 04/20/2018 1537   MCH 25.7 (L) 04/20/2018 1537   MCHC 31.8 04/20/2018 1537   RDW 14.6 04/20/2018 1537   LYMPHSABS 1.3 05/11/2017 1323   MONOABS 0.6 05/11/2017 1323   EOSABS 0.2 05/11/2017 1323   BASOSABS 0.0 05/11/2017 1323   Iron/TIBC/Ferritin/ %Sat    Component Value Date/Time   IRON 114 11/01/2015 1031   TIBC 372 11/01/2015 1031   FERRITIN 35 11/01/2015 1031   IRONPCTSAT 31 11/01/2015 1031   Lipid Panel     Component Value Date/Time   CHOL 169 02/15/2018 1034   TRIG 168 (H) 02/15/2018 1034   HDL 41 02/15/2018 1034   CHOLHDL 6.0 10/17/2011 0315   VLDL 59 (H) 10/17/2011 0315   LDLCALC 94 02/15/2018 1034   Hepatic Function Panel     Component Value Date/Time   PROT 8.0 04/20/2018 1537   PROT 6.8 11/03/2017 1310   ALBUMIN 3.7 04/20/2018 1537   ALBUMIN 3.8 11/03/2017 1310   AST 18 04/20/2018 1537   ALT 14 04/20/2018 1537   ALKPHOS 60 04/20/2018 1537   BILITOT 0.8 04/20/2018 1537   BILITOT 0.3 11/03/2017 1310   BILIDIR <0.1  06/03/2012 0220   IBILI NOT CALCULATED 06/03/2012 0220      Component Value Date/Time   TSH 0.947 11/03/2017 1310   TSH 2.020 09/08/2017 1129   TSH 1.045 06/03/2012 0520   Results for NATALE, MELGAREJO (MRN [id number]) as of 10/20/2018 11:49  Ref. Range 02/15/2018 10:34  Vitamin D, 25-Hydroxy Latest Ref Range: 30.0 - 100.0 ng/mL 26.5 (L)   OBESITY BEHAVIORAL INTERVENTION VISIT  Today's visit was #23  Starting weight: 238 lbs Starting date: 11/03/2017 Today's weight: 227 lbs  Today's date: 10/20/2018 Total lbs lost to date: 11    10/20/2018  Height 5\' 6"  (1.676 m)  Weight 227 lb (103 kg)  BMI (Calculated) 36.66  BLOOD PRESSURE - SYSTOLIC [id number]  BLOOD PRESSURE - DIASTOLIC 84   Body Fat % 45 %  Total Body Water (lbs) 82.8 lbs   ASK: We discussed the diagnosis of obesity with Malaysha Carmelia Roller today and Song agreed to give [name] permission to discuss obesity behavioral modification therapy today.  ASSESS: Ellin has the diagnosis of obesity and her BMI today is 36.8. Kymari is in the action stage of change.  ADVISE: Rosaleen was educated on the multiple health risks of obesity as well as the benefit of weight loss to improve her health. She was advised of the need for long term treatment and the importance of lifestyle modifications to improve her current health and to decrease her risk of future health problems.  AGREE: Multiple dietary modification options and treatment options were discussed and  Lee-Anne agreed to follow the recommendations documented in the above note.  ARRANGE: Quanna was educated on the importance of frequent visits to treat obesity as outlined per CMS and USPSTF guidelines and agreed to schedule her next follow up appointment today.  Migdalia Dk, am acting as Location manager for [employer], DO  I have reviewed the above documentation for accuracy and completeness, and I agree with the above. -Jearld Lesch, DO

## 2018-10-21 ENCOUNTER — Other Ambulatory Visit (INDEPENDENT_AMBULATORY_CARE_PROVIDER_SITE_OTHER): Payer: Self-pay

## 2018-10-21 DIAGNOSIS — E559 Vitamin D deficiency, unspecified: Secondary | ICD-10-CM

## 2018-10-21 MED ORDER — VITAMIN D (ERGOCALCIFEROL) 1.25 MG (50000 UNIT) PO CAPS: 50000.0000 [IU] | ORAL_CAPSULE | ORAL | 0 refills | Status: DC

## 2018-10-23 LAB — HEMOGLOBIN A1C
Est. average glucose Bld gHb Est-mCnc: 197 mg/dL
Hgb A1c MFr Bld: 8.5 % — ABNORMAL HIGH (ref 4.8–5.6)

## 2018-10-23 LAB — VITAMIN D 25 HYDROXY (VIT D DEFICIENCY, FRACTURES): Vit D, 25-Hydroxy: 28.3 ng/mL — ABNORMAL LOW (ref 30.0–100.0)

## 2018-10-28 ENCOUNTER — Other Ambulatory Visit (INDEPENDENT_AMBULATORY_CARE_PROVIDER_SITE_OTHER): Payer: Self-pay | Admitting: Bariatrics

## 2018-10-28 DIAGNOSIS — F3289 Other specified depressive episodes: Secondary | ICD-10-CM

## 2018-10-29 ENCOUNTER — Ambulatory Visit: Payer: Self-pay

## 2018-11-01 DIAGNOSIS — Z794 Long term (current) use of insulin: Secondary | ICD-10-CM | POA: Diagnosis not present

## 2018-11-01 DIAGNOSIS — E1140 Type 2 diabetes mellitus with diabetic neuropathy, unspecified: Secondary | ICD-10-CM | POA: Diagnosis not present

## 2018-11-01 DIAGNOSIS — E118 Type 2 diabetes mellitus with unspecified complications: Secondary | ICD-10-CM | POA: Diagnosis not present

## 2018-11-02 ENCOUNTER — Other Ambulatory Visit: Payer: Self-pay

## 2018-11-02 NOTE — Patient Outreach (Signed)
  [facility]) Care Management Chronic Special Needs Program  11/02/2018  Name: Tina [name] DOB: 1970/03/16  MRN: [id number]  [name] is enrolled in a chronic special needs plan for Diabetes. Client called with no answer No answer and unable to leave HIPAA compliant message. 1st attempt Plan for 2nd outreach call in one week Chronic care management coordinator will attempt outreach in one week.   Peter Garter RN, Jackquline Denmark, CDE Chronic Care Management Coordinator [facility] Care Management [phone]

## 2018-11-03 ENCOUNTER — Encounter (INDEPENDENT_AMBULATORY_CARE_PROVIDER_SITE_OTHER): Payer: Self-pay | Admitting: Bariatrics

## 2018-11-03 ENCOUNTER — Other Ambulatory Visit: Payer: Self-pay

## 2018-11-03 ENCOUNTER — Ambulatory Visit (INDEPENDENT_AMBULATORY_CARE_PROVIDER_SITE_OTHER): Payer: Federal, State, Local not specified - PPO | Admitting: Bariatrics

## 2018-11-03 VITALS — BP 115/74 | HR 88 | Temp 98.3°F | Ht 66.0 in | Wt 227.0 lb

## 2018-11-03 DIAGNOSIS — Z6836 Body mass index (BMI) 36.0-36.9, adult: Secondary | ICD-10-CM

## 2018-11-03 DIAGNOSIS — Z9189 Other specified personal risk factors, not elsewhere classified: Secondary | ICD-10-CM

## 2018-11-03 DIAGNOSIS — E119 Type 2 diabetes mellitus without complications: Secondary | ICD-10-CM

## 2018-11-03 DIAGNOSIS — E559 Vitamin D deficiency, unspecified: Secondary | ICD-10-CM

## 2018-11-03 DIAGNOSIS — F3289 Other specified depressive episodes: Secondary | ICD-10-CM

## 2018-11-03 MED ORDER — BUPROPION HCL ER (SR) 150 MG PO TB12: 150.0000 mg | ORAL_TABLET | Freq: Every day | ORAL | 0 refills | Status: DC

## 2018-11-03 NOTE — Progress Notes (Signed)
Office: [phone]  /  Fax: [phone]   HPI:   Chief Complaint: OBESITY Tina [name] is here to discuss her progress with her obesity treatment plan. She is on the Category 3 plan and is following her eating plan approximately 50% of the time. She states she is exercising with exercise video 30 minutes 3 times per week. Syreeta's weight remains the same. She has noticed that if she takes more insulin that she does not lose weight. Her weight is 227 lb (103 kg) today and has not lost weight since her last visit. She has lost 11 lbs since starting treatment with [name].  Vitamin D deficiency Rateel has a diagnosis of Vitamin D deficiency. Last Vitamin D 28.3 on 10/20/2018. She is currently taking Vit D and denies nausea, vomiting or muscle weakness.  At risk for osteopenia and osteoporosis [place] is at higher risk of osteopenia and osteoporosis due to Vitamin D deficiency.   Diabetes II Leomia has a diagnosis of diabetes type II. Tiphanie states fasting blood sugars have wide variations, is using Omnipod. She reports having 2 lows below 60. Last A1c was 8.5 on 10/20/2018. She has been working on intensive lifestyle modifications including diet, exercise, and weight loss to help control her blood glucose levels.  Depression with emotional eating behaviors Kimla is struggling with emotional eating and using food for comfort to the extent that it is negatively impacting her health. She often snacks when she is not hungry. Vallorie sometimes feels she is out of control and then feels guilty that she made poor food choices. She has been working on behavior modification techniques to help reduce her emotional eating and has been somewhat successful. Shaquasha is taking bupropion. She shows no sign of suicidal or homicidal ideations.  Depression screen [facility] 2/9 05/18/2018 05/13/2018 01/28/2018 01/01/2018 11/03/2017  Decreased Interest 3 1 1 1 2   Down, Depressed, Hopeless 2 0 0 1 3  PHQ - 2 Score 5 1 1 2  5   Altered sleeping 3 - - - 2  Tired, decreased energy 3 - - - 3  Change in appetite 3 - - - 3  Feeling bad or failure about yourself  3 - - - 3  Trouble concentrating 2 - - - 1  Moving slowly or fidgety/restless 0 - - - 0  Suicidal thoughts 0 - - - 1  PHQ-9 Score 19 - - - 18  Difficult doing work/chores - - - - Somewhat difficult  Some recent data might be hidden   ASSESSMENT AND PLAN:  Vitamin D deficiency  Type 2 diabetes mellitus without complication, without long-term current use of insulin (HCC)  At risk for osteoporosis  Other depression - with emotional eating - Plan: buPROPion (WELLBUTRIN SR) 150 MG 12 hr tablet  Class 2 severe obesity with serious comorbidity and body mass index (BMI) of 36.0 to 36.9 in adult, unspecified obesity type (HCC)  PLAN:  Vitamin D Deficiency Bryndle was informed that low Vitamin D levels contributes to fatigue and are associated with obesity, breast, and colon cancer. She agrees to continue taking Vit D and will follow-up for routine testing of Vitamin D, at least 2-3 times per year. She was informed of the risk of over-replacement of Vitamin D and agrees to not increase her dose unless she discusses this with [name] first. Cosette agrees to follow-up with our clinic in 2-3 weeks.  At risk for osteopenia and osteoporosis Keyonda was given extended  (15 minutes) osteoporosis prevention counseling today. Neyah is  at risk for osteopenia and osteoporosis due to her Vitamin D deficiency. She was encouraged to take her Vitamin D and follow her higher calcium diet and increase strengthening exercise to help strengthen her bones and decrease her risk of osteopenia and osteoporosis.  Diabetes II Blanka has been given extensive diabetes education by myself today including ideal fasting and post-prandial blood glucose readings, individual ideal HgA1c goals  and hypoglycemia prevention. We discussed the importance of good blood sugar control to decrease the  likelihood of diabetic complications such as nephropathy, neuropathy, limb loss, blindness, coronary artery disease, and death. We discussed the importance of intensive lifestyle modification including diet, exercise and weight loss as the first line treatment for diabetes. Sanita agrees to continue her diabetes medications. She will not eat early in the evening to avoid lows. She will decrease her insulin at night (10 units of insulin with every meal) when exercising. She will follow-up at the agreed upon time to monitor her progress.  Depression with Emotional Eating Behaviors We discussed behavior modification techniques today to help Skye deal with her emotional eating and depression. Shunta was given a prescription for bupropion 150 mg 1 PO daily #30 with 0 refills. She agrees to follow-up with our clinic in 2-3 weeks.  Obesity Hatley is currently in the action stage of change. As such, her goal is to continue with weight loss efforts. She has agreed to follow the Category 3 plan. Wynne will work on meal planning, intentional eating, will factor in coffee creamer, and will increase her protein intake. Camrin has been instructed to continue her current exercise regimen for weight loss and overall health benefits. We discussed the following Behavioral Modification Strategies today: increasing lean protein intake, decreasing simple carbohydrates, increasing vegetables, increase H20 intake, decrease eating out, no skipping meals, work on meal planning and easy cooking plans, keeping healthy foods in the home, and planning for success.  Ashten has agreed to follow-up with our clinic in 2-3 weeks. She was informed of the importance of frequent follow-up visits to maximize her success with intensive lifestyle modifications for her multiple health conditions.  ALLERGIES: Allergies  Allergen Reactions  . Contrast Media [Iodinated Diagnostic Agents] Other (See Comments)    Difficulty  breathing  . Iohexol Hives, Nausea And Vomiting and Swelling     Desc: Magnevist-gadolinium-difficulty breathing, throat swelling   . Midazolam Hcl Anaphylaxis    Difficulty breathing  . Shellfish Allergy Anaphylaxis  . Valsartan Swelling  . Metformin And Related Diarrhea and Nausea And Vomiting  . Other Itching    Patient is allergic to all nuts except peanuts.   . Avandia [Rosiglitazone Maleate] Hives and Other (See Comments)  . Geodon [Ziprasidone] Other (See Comments)    UNKNOWN  . Kiwi Extract Itching and Swelling  . Latex Itching    MEDICATIONS: Current Outpatient Medications on File Prior to Visit  Medication Sig Dispense Refill  . allopurinol (ZYLOPRIM) 100 MG tablet Take 100 mg by mouth every evening.    Marland Kitchen amLODipine (NORVASC) 10 MG tablet TAKE 1 TABLET BY MOUTH EVERY DAY 90 tablet 1  . aspirin 325 MG tablet Take 325 mg by mouth at bedtime.     Marland Kitchen atorvastatin (LIPITOR) 10 MG tablet Take 1 tablet (10 mg total) by mouth daily. 30 tablet 0  . cetirizine (ZYRTEC) 10 MG tablet Take 10 mg by mouth daily as needed for allergies.   11  . cloNIDine (CATAPRES) 0.1 MG tablet Take 0.1 mg by mouth 2 (two) times  daily.  0  . colchicine 0.6 MG tablet Take 0.6 mg by mouth daily as needed (For gout flare-up.).   1  . EPINEPHrine 0.3 mg/0.3 mL IJ SOAJ injection INJECT AS DIRECTED AS NEEDED FOR ALLERGIC REACTION 1 Device 2  . fluticasone (FLONASE) 50 MCG/ACT nasal spray daily as needed.     . gabapentin (NEURONTIN) 600 MG tablet Take 600 mg by mouth 3 (three) times daily.     . insulin aspart (NOVOLOG) 100 UNIT/ML injection Inject 5-15 Units into the skin 3 (three) times daily with meals. Per sliding scale--pt uses Omnipod and gets a basal rate     . Insulin Glargine (BASAGLAR KWIKPEN) 100 UNIT/ML SOPN Inject 18 Units into the skin daily.     . Liraglutide (VICTOZA) 18 MG/3ML SOPN Inject 1.2 mg into the skin daily.    Marland Kitchen omeprazole (PRILOSEC) 40 MG capsule TAKE 1 CAPSULE BY MOUTH EVERY DAY 90  capsule 1  . ondansetron (ZOFRAN-ODT) 4 MG disintegrating tablet Take 1 tablet (4 mg total) by mouth every 8 (eight) hours as needed for nausea or vomiting. Dissolve under tongue 90 tablet 1  . spironolactone (ALDACTONE) 50 MG tablet Take 1 tablet (50 mg total) by mouth daily. (Patient taking differently: Take 100 mg by mouth 2 (two) times daily. ) 90 tablet 3  . tiZANidine (ZANAFLEX) 2 MG tablet TAKE 1 TABLET AT BEDTIME DAILY. CAN TAKE 1/2 TABLET IN THE EVERY MORNING AS NEEDED 135 tablet 1  . traMADol (ULTRAM) 50 MG tablet TAKE 1 TABLET BY MOUTH 3 TIMES A DAY AS NEEDED (Patient taking differently: 2 (two) times daily. ) 90 tablet 3  . Vitamin D, Ergocalciferol, (DRISDOL) 1.25 MG (50000 UT) CAPS capsule Take 1 capsule (50,000 Units total) by mouth every 7 (seven) days. 4 capsule 0   No current facility-administered medications on file prior to visit.     PAST MEDICAL HISTORY: Past Medical History:  Diagnosis Date  . Allergy   . Anemia    DURING MENSES--HAS HEAVY BLEEDING WITH PERODS  . Anxiety   . Back pain, chronic    "ongoing"  . Blood transfusion    IN 2012  AFTER C -SECTION  . Cerebral thrombosis with cerebral infarction [facility]) JUNE 2011   RIGHT SIDED WEAKNESS ( ARM AND LEG ) AND SPASMS-remains with slight weakness and vertigo.  . Constipation   . Depression   . Diabetes mellitus   . Diabetic neuropathy ([place])    BOTH FEET --COMES AND GOES  . Edema, lower extremity   . Endometriosis   . Fatty liver   . GERD (gastroesophageal reflux disease)    with pregnancy  . H/O eye surgery   . Headache(784.0)    MIGRAINES--NOT REALLY HEADACHE-MORE LIKE PRESSURE SENSATION IN HEAD-FEELS DIZZIY AND  FAINT AS THE PRESSURE RESOLVES  . Hernia, incisional, RLQ, s/p lap repair Sep 2013 09/02/2011  . History of vertigo 03/22/2018  . Hx of migraines 10/19/2011  . Hypertension   . Leg pain, right    "like bad Charley horse"  . Multiple food allergies   . Panic disorder without agoraphobia   . Rash     HANDS, ARMS --STATES HX OF RASH EVER SINCE CHILDBIRTH/PREGNANCY.  STATES THE RASH OFTEN OCCURS WHEN SHE IS REALLY STRESSED."goes and comes-presently left ring finger"  . Restless leg syndrome    DIAGNOSED BY SLEEP STUDY - PT TOLD SHE DID NOT HAVE SLEEP APNEA  . Right rotator cuff tear    PAIN IN RIGHT SHOULDER  .  SBO (small bowel obstruction) ([place]) 06/03/2012  . Shortness of breath   . Spastic hemiplegia affecting dominant side ([place])   . Stomach problems   . Stroke ([place])   . Ventral hernia    RIGHT LOWER QUADRANT-CAUSING SOME PAIN  . Weakness of right side of body     PAST SURGICAL HISTORY: Past Surgical History:  Procedure Laterality Date  . APPLICATION OF WOUND VAC N/A 05/25/2015   Procedure: APPLICATION OF WOUND VAC;  Surgeon: Michael Boston, MD;  Location: [facility];  Service: General;  Laterality: N/A;  . CESAREAN SECTION  2012  . COLONOSCOPY    . DIAGNOSTIC LAPAROSCOPY    . ESOPHAGOGASTRODUODENOSCOPY N/A 06/03/2012   Procedure: ESOPHAGOGASTRODUODENOSCOPY (EGD);  Surgeon: [name] Craver, MD;  Location: [facility] ENDOSCOPY;  Service: Endoscopy;  Laterality: N/A;  . EXCISION MASS ABDOMINAL N/A 05/25/2015   Procedure: ABDOMINAL WALL EXPLORATION EXCISION OF SEROMA REMOVAL OF REDUNDANT SKIN ;  Surgeon: Michael Boston, MD;  Location: [facility];  Service: General;  Laterality: N/A;  . EYE SURGERY     Eye laser for vessel hemorrhaging  . fybroid removal    . HERNIA REPAIR  10/03/11   ventral hernia repair  . INSERTION OF MESH N/A 02/04/2013   Procedure: INSERTION OF MESH;  Surgeon: Adin Hector, MD;  Location: [facility];  Service: General;  Laterality: N/A;  . UMBILICAL HERNIA REPAIR N/A 02/04/2013   Procedure: LAPAROSCOPIC ventral wall hernia repair LAPAROSCOPIC LYSIS OF ADHESIONS laparoscopic exploration of abdomen ;  Surgeon: Adin Hector, MD;  Location: [facility];  Service: General;  Laterality: N/A;  . UPPER GASTROINTESTINAL ENDOSCOPY    . URETER REVISION     Bilateral "twisted"  . UTERINE FIBROID  SURGERY     2 SURGERIES FOR FIBROIDS  . VENTRAL HERNIA REPAIR  10/03/2011   Procedure: LAPAROSCOPIC VENTRAL HERNIA;  Surgeon: Adin Hector, MD;  Location: [facility];  Service: General;  Laterality: N/A;    SOCIAL HISTORY: Social History   Tobacco Use  . Smoking status: Never Smoker  . Smokeless tobacco: Never Used  Substance Use Topics  . Alcohol use: No    Alcohol/week: 0.0 standard drinks  . Drug use: No    FAMILY HISTORY: Family History  Problem Relation Age of Onset  . Diabetes Father   . Kidney disease Father   . Depression Father   . Drug abuse Father   . Allergic rhinitis Mother   . Eczema Mother   . Urticaria Mother   . Depression Mother   . Anxiety disorder Mother   . Bipolar disorder Mother   . Alcoholism Mother   . Drug abuse Mother   . Eating disorder Mother   . Diabetes Maternal Grandmother   . Hyperlipidemia Paternal Grandmother   . Stroke Paternal Grandmother   . Eczema Sister   . Urticaria Sister   . Colon cancer Paternal Uncle   . Other Neg Hx   . Angioedema Neg Hx   . Asthma Neg Hx   . Colon polyps Neg Hx   . Esophageal cancer Neg Hx   . Rectal cancer Neg Hx   . Stomach cancer Neg Hx    ROS: Review of Systems  Gastrointestinal: Negative for nausea and vomiting.  Musculoskeletal:       Negative for muscle weakness.  Psychiatric/Behavioral: Positive for depression (emotional eating). Negative for suicidal ideas.       Negative for homicidal ideas.   PHYSICAL EXAM: Blood pressure 115/74, pulse 88, temperature 98.3 F (  36.8 C), temperature source Oral, height 5\' 6"  (1.676 m), weight 227 lb (103 kg), SpO2 97 %. Body mass index is 36.64 kg/m. Physical Exam Vitals signs reviewed.  Constitutional:      Appearance: Normal appearance. She is obese.  Cardiovascular:     Rate and Rhythm: Normal rate.     Pulses: Normal pulses.  Pulmonary:     Effort: Pulmonary effort is normal.     Breath sounds: Normal breath sounds.  Musculoskeletal:  Normal range of motion.  Skin:    General: Skin is warm and dry.  Neurological:     Mental Status: She is alert and oriented to person, place, and time.  Psychiatric:        Behavior: Behavior normal.   RECENT LABS AND TESTS: BMET    Component Value Date/Time   NA 137 04/20/2018 1537   NA 141 11/03/2017 1310   K 3.6 04/20/2018 1537   CL 99 04/20/2018 1537   CO2 25 04/20/2018 1537   GLUCOSE 240 (H) 04/20/2018 1537   BUN 22 (H) 04/20/2018 1537   BUN 11 11/03/2017 1310   CREATININE 1.11 (H) 04/20/2018 1537   CALCIUM 10.1 04/20/2018 1537   GFRNONAA 59 (L) 04/20/2018 1537   GFRAA >60 04/20/2018 1537   Lab Results  Component Value Date   HGBA1C 8.5 (H) 10/20/2018   HGBA1C 8.5 (H) 02/15/2018   HGBA1C 8.4 (H) 11/03/2017   HGBA1C 7.5 (H) 05/18/2015   HGBA1C 8.7 (H) 02/05/2013   No results found for: INSULIN CBC    Component Value Date/Time   WBC 13.1 (H) 04/20/2018 1537   RBC 5.26 (H) 04/20/2018 1537   HGB 13.5 04/20/2018 1537   HCT 42.4 04/20/2018 1537   PLT 348 04/20/2018 1537   MCV 80.6 04/20/2018 1537   MCH 25.7 (L) 04/20/2018 1537   MCHC 31.8 04/20/2018 1537   RDW 14.6 04/20/2018 1537   LYMPHSABS 1.3 05/11/2017 1323   MONOABS 0.6 05/11/2017 1323   EOSABS 0.2 05/11/2017 1323   BASOSABS 0.0 05/11/2017 1323   Iron/TIBC/Ferritin/ %Sat    Component Value Date/Time   IRON 114 11/01/2015 1031   TIBC 372 11/01/2015 1031   FERRITIN 35 11/01/2015 1031   IRONPCTSAT 31 11/01/2015 1031   Lipid Panel     Component Value Date/Time   CHOL 169 02/15/2018 1034   TRIG 168 (H) 02/15/2018 1034   HDL 41 02/15/2018 1034   CHOLHDL 6.0 10/17/2011 0315   VLDL 59 (H) 10/17/2011 0315   LDLCALC 94 02/15/2018 1034   Hepatic Function Panel     Component Value Date/Time   PROT 8.0 04/20/2018 1537   PROT 6.8 11/03/2017 1310   ALBUMIN 3.7 04/20/2018 1537   ALBUMIN 3.8 11/03/2017 1310   AST 18 04/20/2018 1537   ALT 14 04/20/2018 1537   ALKPHOS 60 04/20/2018 1537   BILITOT 0.8  04/20/2018 1537   BILITOT 0.3 11/03/2017 1310   BILIDIR <0.1 06/03/2012 0220   IBILI NOT CALCULATED 06/03/2012 0220      Component Value Date/Time   TSH 0.947 11/03/2017 1310   TSH 2.020 09/08/2017 1129   TSH 1.045 06/03/2012 0520   Results for JOHNYE, OSTROFF (MRN [id number]) as of 11/03/2018 16:42  Ref. Range 10/20/2018 09:27  Vitamin D, 25-Hydroxy Latest Ref Range: 30.0 - 100.0 ng/mL 28.3 (L)   OBESITY BEHAVIORAL INTERVENTION VISIT  Today's visit was #24  Starting weight: 238 lbs Starting date: 11/03/2017 Today's weight: 227 lbs  Today's date: 11/03/2018 Total lbs lost  to date: 11    11/03/2018  Height 5\' 6"  (1.676 m)  Weight 227 lb (103 kg)  BMI (Calculated) 36.66  BLOOD PRESSURE - SYSTOLIC [id number]  BLOOD PRESSURE - DIASTOLIC 74   Body Fat % 123456 %  Total Body Water (lbs) 82 lbs   ASK: We discussed the diagnosis of obesity with Therisa Carmelia Roller today and Rasheen agreed to give [name] permission to discuss obesity behavioral modification therapy today.  ASSESS: Nasiah has the diagnosis of obesity and her BMI today is 36.7. Kenyia is in the action stage of change.   ADVISE: Liley was educated on the multiple health risks of obesity as well as the benefit of weight loss to improve her health. She was advised of the need for long term treatment and the importance of lifestyle modifications to improve her current health and to decrease her risk of future health problems.  AGREE: Multiple dietary modification options and treatment options were discussed and  Ketra agreed to follow the recommendations documented in the above note.  ARRANGE: Tymesha was educated on the importance of frequent visits to treat obesity as outlined per CMS and USPSTF guidelines and agreed to schedule her next follow up appointment today.  Migdalia Dk, am acting as Location manager for [employer], DO  I have reviewed the above documentation for accuracy and completeness, and I agree  with the above. -Jearld Lesch, DO

## 2018-11-04 ENCOUNTER — Encounter (INDEPENDENT_AMBULATORY_CARE_PROVIDER_SITE_OTHER): Payer: Self-pay | Admitting: Bariatrics

## 2018-11-11 ENCOUNTER — Other Ambulatory Visit: Payer: Self-pay

## 2018-11-11 NOTE — Patient Outreach (Signed)
  [facility]) Care Management Chronic Special Needs Program  11/11/2018  Name: Tina [name] DOB: 01-20-1970  MRN: [id number]  Ms. Vern Lorenzetti is enrolled in a chronic special needs plan for Diabetes. Client called with no answer No answer and unable to leave a HIPAA compliant message left. 2nd attempt Plan for 3rd outreach call in one week Chronic care management coordinator will attempt outreach in one week.   Peter Garter RN, Jackquline Denmark, CDE Chronic Care Management Coordinator [facility] Care Management [phone]

## 2018-11-18 ENCOUNTER — Other Ambulatory Visit: Payer: Self-pay

## 2018-11-18 NOTE — Patient Outreach (Signed)
[facility]) Care Management Chronic Special Needs Program  11/18/2018  Name: [name] DOB: May 26, 1970  MRN: [id number]  Tina [name] is enrolled in a chronic special needs plan for Diabetes. Reviewed and updated care plan.  Subjective: Client states she has been very busy taking care of her boys and she is now taking online college classes.  States that her blood sugars range 95-134 in the morning and 130-145 later in the day.  States she is trying to follow her diet and is still working on weight loss.  Denies any falls.  States she and her husband have not taken their completed Advanced Directives forms to get notarized yet  Goals Addressed            This Visit's Progress   . Advanced Care Planning complete by the next 6 months(continue 11/18/18)   On track    Plan to take completed  Advanced Directives to be notarized     . Client understands the importance of follow-up with providers by attending scheduled visits   On track   . Client will not report change from baseline and no repeated symptoms of stroke with in the next 6 months( continue 11/18/18)   On track   . Client will report no fall or injuries in the next 3 months.(continue 11/18/18)   On track    Reports no falls Get up slowly    . Client will use Assistive Devices as needed and verbalize understanding of device use   On track   . Client will verbalize knowledge of self management of Hypertension as evidences by BP reading of 140/90 or less; or as defined by provider   On track   . Diabetes Patient stated goal to lose 10 lbs in the next 3 months( continue goal 11/18/18) (pt-stated)   On track    Continue to follow low carbohydrate diet as advised from the [facility]     . HEMOGLOBIN A1C < 7.0        Diabetes self management actions:  Glucose monitoring per provider recommendations  Eat Healthy  Check feet daily  Visit provider every 3-6 months as  directed  Hbg A1C level every 3-6 months.  Eye Exam yearly    . Maintain timely refills of diabetic medication as prescribed within the year .   On track   . COMPLETED: Obtain Annual Eye (retinal)  Exam        Completed 06/15/18    . Obtain Annual Foot Exam   On track   . Obtain annual screen for micro albuminuria (urine) , nephropathy (kidney problems)   On track   . COMPLETED: Obtain Hemoglobin A1C at least 2 times per year       Completed 02/15/18, 10/20/18    . COMPLETED: Visit Primary Care Provider or Endocrinologist at least 2 times per year        Visits 05/19/18, 08/30/18     Client is not meeting diabetes self management goal of hemoglobin A1C of <7% with last reading of 8.5% Client continues to be followed by the healthy weight and wellness clinic Reinforced to follow her food plan and to get regular exercise Encouraged to get Advanced Directives forms notarized Reviewed number for 24-hour nurse Line Reviewed COVID 19 precautions  Plan:  Send successful outreach letter with a copy of their individualized care plan and Send individual care plan to provider  Chronic care management coordinator will outreach in:  3 Months    Peter Garter RN, [facility], [place] Management [phone]

## 2018-11-25 ENCOUNTER — Encounter (INDEPENDENT_AMBULATORY_CARE_PROVIDER_SITE_OTHER): Payer: Self-pay | Admitting: Bariatrics

## 2018-11-25 ENCOUNTER — Ambulatory Visit (INDEPENDENT_AMBULATORY_CARE_PROVIDER_SITE_OTHER): Payer: Federal, State, Local not specified - PPO | Admitting: Bariatrics

## 2018-11-25 ENCOUNTER — Other Ambulatory Visit: Payer: Self-pay

## 2018-11-25 VITALS — BP 112/73 | HR 78 | Temp 98.1°F | Ht 66.0 in | Wt 228.0 lb

## 2018-11-25 DIAGNOSIS — Z9189 Other specified personal risk factors, not elsewhere classified: Secondary | ICD-10-CM

## 2018-11-25 DIAGNOSIS — I10 Essential (primary) hypertension: Secondary | ICD-10-CM | POA: Diagnosis not present

## 2018-11-25 DIAGNOSIS — Z794 Long term (current) use of insulin: Secondary | ICD-10-CM | POA: Diagnosis not present

## 2018-11-25 DIAGNOSIS — Z6836 Body mass index (BMI) 36.0-36.9, adult: Secondary | ICD-10-CM | POA: Diagnosis not present

## 2018-11-25 DIAGNOSIS — E119 Type 2 diabetes mellitus without complications: Secondary | ICD-10-CM

## 2018-11-25 DIAGNOSIS — K5909 Other constipation: Secondary | ICD-10-CM

## 2018-11-25 DIAGNOSIS — E559 Vitamin D deficiency, unspecified: Secondary | ICD-10-CM

## 2018-11-29 NOTE — Progress Notes (Signed)
Office: [phone]  /  Fax: [phone]   HPI:   Chief Complaint: OBESITY Tina [name] is here to discuss her progress with her obesity treatment plan. She is on the Category 3 plan and is following her eating plan approximately 70 % of the time. She states she is aerobics for 30 minutes 3 times per week. Tina [name] is up 1 lb, but has done fairly well overall. Her sleep patterns are off (trouble going to sleep and staying sleep).  Her weight is 228 lb (103.4 kg) today and has gained 1 lb since her last visit. She has lost 10 lbs since starting treatment with [name].  Hypertension Tina [name] Tina [name] is a 48 y.o. female with hypertension. Tina [name] is taking Norvasc, Catapres, and Aldactone (good control). She denies chest pain. She is working on weight loss to help control her blood pressure with the goal of decreasing her risk of heart attack and stroke.   Diabetes II Tina [name] has a diagnosis of diabetes type II. Tina [name] is taking Novolog, insulin Glargine, and Victoza. She states her fasting BGs and 2 hour post prandials range between 60 and 150. Last A1c was 8.5. She has been working on intensive lifestyle modifications including diet, exercise, and weight loss to help control her blood glucose levels.  Constipation Tina [name] notes constipation and she has started milk of magnesium. She states BM are less frequent and are not hard and painful. She denies hematochezia or melena. She denies drinking less H20 recently.  Vitamin D Deficiency Tina [name] has a diagnosis of vitamin D deficiency. She is currently taking prescription Vit D and denies nausea, vomiting or muscle weakness.  At risk for osteopenia and osteoporosis Tina [name] is at higher risk of osteopenia and osteoporosis due to vitamin D deficiency.   ASSESSMENT AND PLAN:  Essential hypertension  Type 2 diabetes mellitus without complication, with long-term current use of insulin (HCC)  Other constipation  Vitamin D deficiency  At risk  for osteoporosis  Class 2 severe obesity with serious comorbidity and body mass index (BMI) of 36.0 to 36.9 in adult, unspecified obesity type ([place])  PLAN:  Hypertension We discussed sodium restriction, working on healthy weight loss, and a regular exercise program as the means to achieve improved blood pressure control. Tina [name] agreed with this plan and agreed to follow up as directed. We will continue to monitor her blood pressure as well as her progress with the above lifestyle modifications. Tina [name] agrees to continue her medications and will watch for signs of hypotension as she continues her lifestyle modifications. Tina [name] agrees to follow up with our clinic in 2 to 3 weeks.  Diabetes II Tina [name] has been given extensive diabetes education by myself today including ideal fasting and post-prandial blood glucose readings, individual ideal Hgb A1c goals and hypoglycemia prevention. We discussed the importance of good blood sugar control to decrease the likelihood of diabetic complications such as nephropathy, neuropathy, limb loss, blindness, coronary artery disease, and death. We discussed the importance of intensive lifestyle modification including diet, exercise and weight loss as the first line treatment for diabetes. Tina [name] agrees to continue her diabetes medications, and she agrees to follow up with our clinic in 2 to 3 weeks.  Constipation Tina [name] was informed decrease bowel movement frequency is normal while losing weight, but stools should not be hard or painful. She was advised to increase her H20 intake and work on increasing her fiber intake. High fiber foods were discussed today. Tina [name] agrees to take miralax daily and add Laxative as  needed. Tina [name] agrees to follow up with our clinic in 2 to 3 weeks.  Vitamin D Deficiency Tina [name] was informed that low vitamin D levels contributes to fatigue and are associated with obesity, breast, and colon cancer. Tina [name] agrees to continue  taking prescription Vit D 50,000 IU every week and will follow up for routine testing of vitamin D, at least 2-3 times per year. She was informed of the risk of over-replacement of vitamin D and agrees to not increase her dose unless she discusses this with [name] first. Tina [name] agrees to follow up with our clinic in 2 to 3 weeks.  At risk for osteopenia and osteoporosis Tina [name] was given extended (15 minutes) osteoporosis prevention counseling today. Tina [name] is at risk for osteopenia and osteoporsis due to her vitamin D deficiency. She was encouraged to take her vitamin D and follow her higher calcium diet and increase strengthening exercise to help strengthen her bones and decrease her risk of osteopenia and osteoporosis.  Obesity Tina [name] is currently in the action stage of change. As such, her goal is to continue with weight loss efforts She has agreed to follow the Category 3 plan Tina [name] has been instructed to work up to a goal of 150 minutes of combined cardio and strengthening exercise per week for weight loss and overall health benefits. We discussed the following Behavioral Modification Strategies today: increasing lean protein intake, decreasing simple carbohydrates , increasing vegetables, decrease eating out and work on meal planning and easy cooking plans, increase H20 intake, mo skipping meals, and keeping healthy foods in the home Tina [name] will adhere more closely to the meal plan. Thanksgiving handout was given.  Tina [name] has agreed to follow up with our clinic in 2 to 3 weeks. She was informed of the importance of frequent follow up visits to maximize her success with intensive lifestyle modifications for her multiple health conditions.  ALLERGIES: Allergies  Allergen Reactions   Contrast Media [Iodinated Diagnostic Agents] Other (See Comments)    Difficulty breathing   Iohexol Hives, Nausea And Vomiting and Swelling     Desc: Magnevist-gadolinium-difficulty breathing, throat  swelling    Midazolam Hcl Anaphylaxis    Difficulty breathing   Shellfish Allergy Anaphylaxis   Valsartan Swelling   Metformin And Related Diarrhea and Nausea And Vomiting   Other Itching    Patient is allergic to all nuts except peanuts.    Avandia [Rosiglitazone Maleate] Hives and Other (See Comments)   Geodon [Ziprasidone] Other (See Comments)    UNKNOWN   Kiwi Extract Itching and Swelling   Latex Itching    MEDICATIONS: Current Outpatient Medications on File Prior to Visit  Medication Sig Dispense Refill   allopurinol (ZYLOPRIM) 100 MG tablet Take 100 mg by mouth every evening.     amLODipine (NORVASC) 10 MG tablet TAKE 1 TABLET BY MOUTH EVERY DAY 90 tablet 1   aspirin 325 MG tablet Take 325 mg by mouth at bedtime.      atorvastatin (LIPITOR) 10 MG tablet Take 1 tablet (10 mg total) by mouth daily. 30 tablet 0   buPROPion (WELLBUTRIN SR) 150 MG 12 hr tablet Take 1 tablet (150 mg total) by mouth daily. 30 tablet 0   cetirizine (ZYRTEC) 10 MG tablet Take 10 mg by mouth daily as needed for allergies.   11   cloNIDine (CATAPRES) 0.1 MG tablet Take 0.1 mg by mouth 2 (two) times daily.  0   colchicine 0.6 MG tablet Take 0.6 mg by mouth daily as needed (For gout  flare-up.).   1   EPINEPHrine 0.3 mg/0.3 mL IJ SOAJ injection INJECT AS DIRECTED AS NEEDED FOR ALLERGIC REACTION 1 Device 2   fluticasone (FLONASE) 50 MCG/ACT nasal spray daily as needed.      gabapentin (NEURONTIN) 600 MG tablet Take 600 mg by mouth 3 (three) times daily.      insulin aspart (NOVOLOG) 100 UNIT/ML injection Inject 5-15 Units into the skin 3 (three) times daily with meals. Per sliding scale--pt uses Omnipod and gets a basal rate      Insulin Glargine (BASAGLAR KWIKPEN) 100 UNIT/ML SOPN Inject 18 Units into the skin daily.      Liraglutide (VICTOZA) 18 MG/3ML SOPN Inject 1.2 mg into the skin daily.     omeprazole (PRILOSEC) 40 MG capsule TAKE 1 CAPSULE BY MOUTH EVERY DAY 90 capsule 1    ondansetron (ZOFRAN-ODT) 4 MG disintegrating tablet Take 1 tablet (4 mg total) by mouth every 8 (eight) hours as needed for nausea or vomiting. Dissolve under tongue 90 tablet 1   spironolactone (ALDACTONE) 50 MG tablet Take 1 tablet (50 mg total) by mouth daily. (Patient taking differently: Take 100 mg by mouth 2 (two) times daily. ) 90 tablet 3   tiZANidine (ZANAFLEX) 2 MG tablet TAKE 1 TABLET AT BEDTIME DAILY. CAN TAKE 1/2 TABLET IN THE EVERY MORNING AS NEEDED 135 tablet 1   traMADol (ULTRAM) 50 MG tablet TAKE 1 TABLET BY MOUTH 3 TIMES A DAY AS NEEDED (Patient taking differently: 2 (two) times daily. ) 90 tablet 3   Vitamin D, Ergocalciferol, (DRISDOL) 1.25 MG (50000 UT) CAPS capsule Take 1 capsule (50,000 Units total) by mouth every 7 (seven) days. 4 capsule 0   No current facility-administered medications on file prior to visit.     PAST MEDICAL HISTORY: Past Medical History:  Diagnosis Date   Allergy    Anemia    DURING MENSES--HAS HEAVY BLEEDING WITH PERODS   Anxiety    Back pain, chronic    "ongoing"   Blood transfusion    IN 2012  AFTER C -SECTION   Cerebral thrombosis with cerebral infarction [facility]) JUNE 2011   RIGHT SIDED WEAKNESS ( ARM AND LEG ) AND SPASMS-remains with slight weakness and vertigo.   Constipation    Depression    Diabetes mellitus    Diabetic neuropathy (HCC)    BOTH FEET --COMES AND GOES   Edema, lower extremity    Endometriosis    Fatty liver    GERD (gastroesophageal reflux disease)    with pregnancy   H/O eye surgery    Headache(784.0)    MIGRAINES--NOT REALLY HEADACHE-MORE LIKE PRESSURE SENSATION IN HEAD-FEELS DIZZIY AND  FAINT AS THE PRESSURE RESOLVES   Hernia, incisional, RLQ, s/p lap repair Sep 2013 09/02/2011   History of vertigo 03/22/2018   Hx of migraines 10/19/2011   Hypertension    Leg pain, right    "like bad Charley horse"   Multiple food allergies    Panic disorder without agoraphobia    Rash    HANDS,  ARMS --STATES HX OF RASH EVER SINCE CHILDBIRTH/PREGNANCY.  STATES THE RASH OFTEN OCCURS WHEN SHE IS REALLY STRESSED."goes and comes-presently left ring finger"   Restless leg syndrome    DIAGNOSED BY SLEEP STUDY - PT TOLD SHE DID NOT HAVE SLEEP APNEA   Right rotator cuff tear    PAIN IN RIGHT SHOULDER   SBO (small bowel obstruction) ([place]) 06/03/2012   Shortness of breath    Spastic hemiplegia affecting dominant  side ([place])    Stomach problems    Stroke ([place])    Ventral hernia    RIGHT LOWER QUADRANT-CAUSING SOME PAIN   Weakness of right side of body     PAST SURGICAL HISTORY: Past Surgical History:  Procedure Laterality Date   APPLICATION OF WOUND VAC N/A 05/25/2015   Procedure: APPLICATION OF WOUND VAC;  Surgeon: Michael Boston, MD;  Location: [facility];  Service: General;  Laterality: N/A;   CESAREAN SECTION  2012   COLONOSCOPY     DIAGNOSTIC LAPAROSCOPY     ESOPHAGOGASTRODUODENOSCOPY N/A 06/03/2012   Procedure: ESOPHAGOGASTRODUODENOSCOPY (EGD);  Surgeon: [name] Craver, MD;  Location: [facility] ENDOSCOPY;  Service: Endoscopy;  Laterality: N/A;   EXCISION MASS ABDOMINAL N/A 05/25/2015   Procedure: ABDOMINAL WALL EXPLORATION EXCISION OF SEROMA REMOVAL OF REDUNDANT SKIN ;  Surgeon: Michael Boston, MD;  Location: [facility];  Service: General;  Laterality: N/A;   EYE SURGERY     Eye laser for vessel hemorrhaging   fybroid removal     HERNIA REPAIR  10/03/11   ventral hernia repair   INSERTION OF MESH N/A 02/04/2013   Procedure: INSERTION OF MESH;  Surgeon: Adin Hector, MD;  Location: [facility];  Service: General;  Laterality: N/A;   UMBILICAL HERNIA REPAIR N/A 02/04/2013   Procedure: LAPAROSCOPIC ventral wall hernia repair LAPAROSCOPIC LYSIS OF ADHESIONS laparoscopic exploration of abdomen ;  Surgeon: Adin Hector, MD;  Location: [facility];  Service: General;  Laterality: N/A;   UPPER GASTROINTESTINAL ENDOSCOPY     URETER REVISION     Bilateral "twisted"   UTERINE FIBROID SURGERY       2 SURGERIES FOR FIBROIDS   VENTRAL HERNIA REPAIR  10/03/2011   Procedure: LAPAROSCOPIC VENTRAL HERNIA;  Surgeon: Adin Hector, MD;  Location: [facility];  Service: General;  Laterality: N/A;    SOCIAL HISTORY: Social History   Tobacco Use   Smoking status: Never Smoker   Smokeless tobacco: Never Used  Substance Use Topics   Alcohol use: No    Alcohol/week: 0.0 standard drinks   Drug use: No    FAMILY HISTORY: Family History  Problem Relation Age of Onset   Diabetes Father    Kidney disease Father    Depression Father    Drug abuse Father    Allergic rhinitis Mother    Eczema Mother    Urticaria Mother    Depression Mother    Anxiety disorder Mother    Bipolar disorder Mother    Alcoholism Mother    Drug abuse Mother    Eating disorder Mother    Diabetes Maternal Grandmother    Hyperlipidemia Paternal Grandmother    Stroke Paternal Grandmother    Eczema Sister    Urticaria Sister    Colon cancer Paternal Uncle    Other Neg Hx    Angioedema Neg Hx    Asthma Neg Hx    Colon polyps Neg Hx    Esophageal cancer Neg Hx    Rectal cancer Neg Hx    Stomach cancer Neg Hx     ROS: Review of Systems  Constitutional: Negative for weight loss.  Cardiovascular: Negative for chest pain.  Gastrointestinal: Positive for constipation. Negative for melena, nausea and vomiting.       Negative hematochezia  Musculoskeletal:       Negative muscle weakness    PHYSICAL EXAM: Blood pressure 112/73, pulse 78, temperature 98.1 F (36.7 C), height 5\' 6"  (1.676 m), weight 228 lb (103.4 kg), SpO2  100 %. Body mass index is 36.8 kg/m. Physical Exam Vitals signs reviewed.  Constitutional:      Appearance: Normal appearance. She is obese.  Cardiovascular:     Rate and Rhythm: Normal rate.     Pulses: Normal pulses.  Pulmonary:     Effort: Pulmonary effort is normal.     Breath sounds: Normal breath sounds.  Musculoskeletal: Normal range of motion.   Skin:    General: Skin is warm and dry.  Neurological:     Mental Status: She is alert and oriented to person, place, and time.  Psychiatric:        Mood and Affect: Mood normal.        Behavior: Behavior normal.     RECENT LABS AND TESTS: BMET    Component Value Date/Time   NA 137 04/20/2018 1537   NA 141 11/03/2017 1310   K 3.6 04/20/2018 1537   CL 99 04/20/2018 1537   CO2 25 04/20/2018 1537   GLUCOSE 240 (H) 04/20/2018 1537   BUN 22 (H) 04/20/2018 1537   BUN 11 11/03/2017 1310   CREATININE 1.11 (H) 04/20/2018 1537   CALCIUM 10.1 04/20/2018 1537   GFRNONAA 59 (L) 04/20/2018 1537   GFRAA >60 04/20/2018 1537   Lab Results  Component Value Date   HGBA1C 8.5 (H) 10/20/2018   HGBA1C 8.5 (H) 02/15/2018   HGBA1C 8.4 (H) 11/03/2017   HGBA1C 7.5 (H) 05/18/2015   HGBA1C 8.7 (H) 02/05/2013   No results found for: INSULIN CBC    Component Value Date/Time   WBC 13.1 (H) 04/20/2018 1537   RBC 5.26 (H) 04/20/2018 1537   HGB 13.5 04/20/2018 1537   HCT 42.4 04/20/2018 1537   PLT 348 04/20/2018 1537   MCV 80.6 04/20/2018 1537   MCH 25.7 (L) 04/20/2018 1537   MCHC 31.8 04/20/2018 1537   RDW 14.6 04/20/2018 1537   LYMPHSABS 1.3 05/11/2017 1323   MONOABS 0.6 05/11/2017 1323   EOSABS 0.2 05/11/2017 1323   BASOSABS 0.0 05/11/2017 1323   Iron/TIBC/Ferritin/ %Sat    Component Value Date/Time   IRON 114 11/01/2015 1031   TIBC 372 11/01/2015 1031   FERRITIN 35 11/01/2015 1031   IRONPCTSAT 31 11/01/2015 1031   Lipid Panel     Component Value Date/Time   CHOL 169 02/15/2018 1034   TRIG 168 (H) 02/15/2018 1034   HDL 41 02/15/2018 1034   CHOLHDL 6.0 10/17/2011 0315   VLDL 59 (H) 10/17/2011 0315   LDLCALC 94 02/15/2018 1034   Hepatic Function Panel     Component Value Date/Time   PROT 8.0 04/20/2018 1537   PROT 6.8 11/03/2017 1310   ALBUMIN 3.7 04/20/2018 1537   ALBUMIN 3.8 11/03/2017 1310   AST 18 04/20/2018 1537   ALT 14 04/20/2018 1537   ALKPHOS 60 04/20/2018  1537   BILITOT 0.8 04/20/2018 1537   BILITOT 0.3 11/03/2017 1310   BILIDIR <0.1 06/03/2012 0220   IBILI NOT CALCULATED 06/03/2012 0220      Component Value Date/Time   TSH 0.947 11/03/2017 1310   TSH 2.020 09/08/2017 1129   TSH 1.045 06/03/2012 0520      OBESITY BEHAVIORAL INTERVENTION VISIT  Today's visit was # 25   Starting weight: 238 lbs Starting date: 11/03/17 Today's weight :228 lbs Today's date: 11/25/2018 Total lbs lost to date: 10    ASK: We discussed the diagnosis of obesity with Tina [name] today and Tina [name] agreed to give [name] permission to discuss obesity behavioral  modification therapy today.  ASSESS: Blessing has the diagnosis of obesity and her BMI today is 36.82 Zanyiah is in the action stage of change   ADVISE: Blia was educated on the multiple health risks of obesity as well as the benefit of weight loss to improve her health. She was advised of the need for long term treatment and the importance of lifestyle modifications to improve her current health and to decrease her risk of future health problems.  AGREE: Multiple dietary modification options and treatment options were discussed and  Mora agreed to follow the recommendations documented in the above note.  ARRANGE: Cameshia was educated on the importance of frequent visits to treat obesity as outlined per CMS and USPSTF guidelines and agreed to schedule her next follow up appointment today.  Wilhemena Durie, am acting as transcriptionist for [employer], DO   I have reviewed the above documentation for accuracy and completeness, and I agree with the above. -Jearld Lesch, DO

## 2018-11-30 ENCOUNTER — Encounter (INDEPENDENT_AMBULATORY_CARE_PROVIDER_SITE_OTHER): Payer: Self-pay | Admitting: Bariatrics

## 2018-11-30 DIAGNOSIS — Z6838 Body mass index (BMI) 38.0-38.9, adult: Secondary | ICD-10-CM | POA: Diagnosis not present

## 2018-11-30 DIAGNOSIS — E559 Vitamin D deficiency, unspecified: Secondary | ICD-10-CM | POA: Diagnosis not present

## 2018-11-30 DIAGNOSIS — E1142 Type 2 diabetes mellitus with diabetic polyneuropathy: Secondary | ICD-10-CM | POA: Diagnosis not present

## 2018-11-30 DIAGNOSIS — E7800 Pure hypercholesterolemia, unspecified: Secondary | ICD-10-CM | POA: Diagnosis not present

## 2018-11-30 DIAGNOSIS — E1140 Type 2 diabetes mellitus with diabetic neuropathy, unspecified: Secondary | ICD-10-CM | POA: Diagnosis not present

## 2018-11-30 DIAGNOSIS — I639 Cerebral infarction, unspecified: Secondary | ICD-10-CM | POA: Diagnosis not present

## 2018-11-30 DIAGNOSIS — I10 Essential (primary) hypertension: Secondary | ICD-10-CM | POA: Diagnosis not present

## 2018-11-30 DIAGNOSIS — E118 Type 2 diabetes mellitus with unspecified complications: Secondary | ICD-10-CM | POA: Diagnosis not present

## 2018-12-02 DIAGNOSIS — E118 Type 2 diabetes mellitus with unspecified complications: Secondary | ICD-10-CM | POA: Diagnosis not present

## 2018-12-02 DIAGNOSIS — Z794 Long term (current) use of insulin: Secondary | ICD-10-CM | POA: Diagnosis not present

## 2018-12-02 DIAGNOSIS — E1140 Type 2 diabetes mellitus with diabetic neuropathy, unspecified: Secondary | ICD-10-CM | POA: Diagnosis not present

## 2018-12-07 ENCOUNTER — Other Ambulatory Visit (INDEPENDENT_AMBULATORY_CARE_PROVIDER_SITE_OTHER): Payer: Self-pay | Admitting: Bariatrics

## 2018-12-07 DIAGNOSIS — F3289 Other specified depressive episodes: Secondary | ICD-10-CM

## 2018-12-08 DIAGNOSIS — E1140 Type 2 diabetes mellitus with diabetic neuropathy, unspecified: Secondary | ICD-10-CM | POA: Diagnosis not present

## 2018-12-08 DIAGNOSIS — E559 Vitamin D deficiency, unspecified: Secondary | ICD-10-CM | POA: Diagnosis not present

## 2018-12-08 DIAGNOSIS — E7800 Pure hypercholesterolemia, unspecified: Secondary | ICD-10-CM | POA: Diagnosis not present

## 2018-12-08 DIAGNOSIS — M109 Gout, unspecified: Secondary | ICD-10-CM | POA: Diagnosis not present

## 2018-12-08 DIAGNOSIS — I10 Essential (primary) hypertension: Secondary | ICD-10-CM | POA: Diagnosis not present

## 2018-12-08 LAB — HEPATIC FUNCTION PANEL
ALT: 18 (ref 7–35)
AST: 14 (ref 13–35)
Bilirubin, Total: 0.2

## 2018-12-08 LAB — HEMOGLOBIN A1C: Hemoglobin A1C: 8.4

## 2018-12-08 LAB — BASIC METABOLIC PANEL
CO2: 24 — AB (ref 13–22)
Chloride: 105 (ref 99–108)
Creatinine: 1.3 — AB (ref 0.5–1.1)
Potassium: 7.1 — AB (ref 3.4–5.3)
Sodium: 138 (ref 137–147)

## 2018-12-08 LAB — VITAMIN D 25 HYDROXY (VIT D DEFICIENCY, FRACTURES)
Vit D, 25-Hydroxy: 20
Vit D, 25-Hydroxy: 20

## 2018-12-08 LAB — LIPID PANEL
Cholesterol: 204 — AB (ref 0–200)
HDL: 48 (ref 35–70)
LDL Cholesterol: 91
Triglycerides: 323 — AB (ref 40–160)

## 2018-12-08 LAB — CBC AND DIFFERENTIAL
HCT: 35 — AB (ref 36–46)
Hemoglobin: 11.3 — AB (ref 12.0–16.0)
WBC: 6.3

## 2018-12-08 LAB — VITAMIN B12: Vitamin B-12: 261

## 2018-12-08 LAB — CBC: RBC: 4.4 (ref 3.87–5.11)

## 2018-12-13 DIAGNOSIS — Z01419 Encounter for gynecological examination (general) (routine) without abnormal findings: Secondary | ICD-10-CM | POA: Diagnosis not present

## 2018-12-13 DIAGNOSIS — Z1151 Encounter for screening for human papillomavirus (HPV): Secondary | ICD-10-CM | POA: Diagnosis not present

## 2018-12-14 DIAGNOSIS — E785 Hyperlipidemia, unspecified: Secondary | ICD-10-CM | POA: Diagnosis not present

## 2018-12-14 DIAGNOSIS — L089 Local infection of the skin and subcutaneous tissue, unspecified: Secondary | ICD-10-CM | POA: Diagnosis not present

## 2018-12-14 DIAGNOSIS — E1142 Type 2 diabetes mellitus with diabetic polyneuropathy: Secondary | ICD-10-CM | POA: Diagnosis not present

## 2018-12-14 DIAGNOSIS — I10 Essential (primary) hypertension: Secondary | ICD-10-CM | POA: Diagnosis not present

## 2018-12-14 DIAGNOSIS — E1122 Type 2 diabetes mellitus with diabetic chronic kidney disease: Secondary | ICD-10-CM | POA: Diagnosis not present

## 2018-12-14 DIAGNOSIS — M109 Gout, unspecified: Secondary | ICD-10-CM | POA: Diagnosis not present

## 2018-12-14 DIAGNOSIS — Z0000 Encounter for general adult medical examination without abnormal findings: Secondary | ICD-10-CM | POA: Diagnosis not present

## 2018-12-14 DIAGNOSIS — H1012 Acute atopic conjunctivitis, left eye: Secondary | ICD-10-CM | POA: Diagnosis not present

## 2018-12-14 DIAGNOSIS — Z794 Long term (current) use of insulin: Secondary | ICD-10-CM | POA: Diagnosis not present

## 2018-12-14 DIAGNOSIS — E875 Hyperkalemia: Secondary | ICD-10-CM | POA: Diagnosis not present

## 2018-12-14 LAB — MICROALBUMIN, URINE: Microalb, Ur: 722.9

## 2018-12-14 LAB — BASIC METABOLIC PANEL: Potassium: 5.8 — AB (ref 3.4–5.3)

## 2018-12-16 ENCOUNTER — Telehealth (INDEPENDENT_AMBULATORY_CARE_PROVIDER_SITE_OTHER): Payer: Federal, State, Local not specified - PPO | Admitting: Family Medicine

## 2018-12-16 ENCOUNTER — Other Ambulatory Visit: Payer: Self-pay

## 2018-12-16 ENCOUNTER — Encounter (INDEPENDENT_AMBULATORY_CARE_PROVIDER_SITE_OTHER): Payer: Self-pay | Admitting: Family Medicine

## 2018-12-16 ENCOUNTER — Ambulatory Visit (INDEPENDENT_AMBULATORY_CARE_PROVIDER_SITE_OTHER): Payer: Federal, State, Local not specified - PPO | Admitting: Bariatrics

## 2018-12-16 DIAGNOSIS — Z6836 Body mass index (BMI) 36.0-36.9, adult: Secondary | ICD-10-CM

## 2018-12-16 DIAGNOSIS — E559 Vitamin D deficiency, unspecified: Secondary | ICD-10-CM

## 2018-12-16 MED ORDER — VITAMIN D (ERGOCALCIFEROL) 1.25 MG (50000 UNIT) PO CAPS: 50000.0000 [IU] | ORAL_CAPSULE | ORAL | 0 refills | Status: DC

## 2018-12-19 NOTE — Progress Notes (Signed)
Office: [phone]  /  Fax: [phone] TeleHealth Visit:  Tina [name] has verbally consented to this TeleHealth visit today. The patient is located at home, the provider is located at the [employer] [facility] office. The participants in this visit include the listed provider and patient. Tina [name] was unable to use realtime audiovisual technology today and the telehealth visit was conducted via telephone (webex failed).   HPI:   Chief Complaint: OBESITY Tina [name] is here to discuss her progress with her obesity treatment plan. She is on the Category 3 plan and is following her eating plan approximately 40 % of the time. She states she is exercising 0 minutes 0 times per week. Tina [name] thinks she has gained 3-4 lbs since her last visit. She notes significant increase in fatigue, and she saw her primary care physician and was found to have high potassium. So her spironolactone was stopped and lasix was added. She is now getting back to her eating plan.  We were unable to weigh the patient today for this TeleHealth visit. She feels as if she has gained 3-4 lbs since her last visit. She has lost 10 lbs since starting treatment with [name].  Vitamin D Deficiency Tina [name] has a diagnosis of vitamin D deficiency. Her level is not yet at goal on prescription Vit D. She still notes some fatigue and denies nausea, vomiting or muscle weakness.  ASSESSMENT AND PLAN:  Vitamin D deficiency - Plan: Vitamin D, Ergocalciferol, (DRISDOL) 1.25 MG (50000 UT) CAPS capsule  Class 2 severe obesity with serious comorbidity and body mass index (BMI) of 36.0 to 36.9 in adult, unspecified obesity type (HCC)  PLAN:  Vitamin D Deficiency Low vitamin D level contributes to fatigue and are associated with obesity, breast, and colon cancer. Tina [name] agrees to continue taking prescription Vit D 50,000 IU every week #4 and we will refill for 1 month. She will follow up for routine testing of vitamin D, at least 2-3  times per year. She was informed of the risk of over-replacement of vitamin D. Tina [name] agrees to follow up with our clinic in 2 weeks.  I spent > than 50% of the 12 minute visit on counseling as documented in the note.  Obesity Tina [name] is currently in the action stage of change. As such, her goal is to continue with weight loss efforts She has agreed to follow the Category 3 plan Tina [name] has been instructed to work up to a goal of 150 minutes of combined cardio and strengthening exercise per week for weight loss and overall health benefits. We discussed the following Behavioral Modification Strategies today: increasing lean protein intake, decreasing simple carbohydrates  and holiday eating strategies    Tina [name] has agreed to follow up with our clinic in 2 weeks. She was informed of the importance of frequent follow up visits to maximize her success with intensive lifestyle modifications for her multiple health conditions.  ALLERGIES: Allergies  Allergen Reactions  . Contrast Media [Iodinated Diagnostic Agents] Other (See Comments)    Difficulty breathing  . Iohexol Hives, Nausea And Vomiting and Swelling     Desc: Magnevist-gadolinium-difficulty breathing, throat swelling   . Midazolam Hcl Anaphylaxis    Difficulty breathing  . Shellfish Allergy Anaphylaxis  . Valsartan Swelling  . Metformin And Related Diarrhea and Nausea And Vomiting  . Other Itching    Patient is allergic to all nuts except peanuts.   . Avandia [Rosiglitazone Maleate] Hives and Other (See Comments)  . Geodon [Ziprasidone] Other (See  Comments)    UNKNOWN  . Kiwi Extract Itching and Swelling  . Latex Itching    MEDICATIONS: Current Outpatient Medications on File Prior to Visit  Medication Sig Dispense Refill  . allopurinol (ZYLOPRIM) 100 MG tablet Take 100 mg by mouth every evening.    Marland Kitchen amLODipine (NORVASC) 10 MG tablet TAKE 1 TABLET BY MOUTH EVERY DAY 90 tablet 1  . aspirin 325 MG tablet Take 325 mg by  mouth at bedtime.     Marland Kitchen atorvastatin (LIPITOR) 10 MG tablet Take 1 tablet (10 mg total) by mouth daily. 30 tablet 0  . buPROPion (WELLBUTRIN SR) 150 MG 12 hr tablet Take 1 tablet by mouth once daily 30 tablet 0  . cetirizine (ZYRTEC) 10 MG tablet Take 10 mg by mouth daily as needed for allergies.   11  . cloNIDine (CATAPRES) 0.1 MG tablet Take 0.1 mg by mouth 2 (two) times daily.  0  . colchicine 0.6 MG tablet Take 0.6 mg by mouth daily as needed (For gout flare-up.).   1  . DULoxetine (CYMBALTA) 20 MG capsule Take 20 mg by mouth daily. 20 mg am, 40 mg pm    . EPINEPHrine 0.3 mg/0.3 mL IJ SOAJ injection INJECT AS DIRECTED AS NEEDED FOR ALLERGIC REACTION 1 Device 2  . fluticasone (FLONASE) 50 MCG/ACT nasal spray daily as needed.     . furosemide (LASIX) 40 MG tablet Take 40 mg by mouth.    . insulin aspart (NOVOLOG) 100 UNIT/ML injection Inject 5-15 Units into the skin 3 (three) times daily with meals. Per sliding scale--pt uses Omnipod and gets a basal rate     . Insulin Glargine (BASAGLAR KWIKPEN) 100 UNIT/ML SOPN Inject 18 Units into the skin daily.     Marland Kitchen omeprazole (PRILOSEC) 40 MG capsule TAKE 1 CAPSULE BY MOUTH EVERY DAY 90 capsule 1  . ondansetron (ZOFRAN-ODT) 4 MG disintegrating tablet Take 1 tablet (4 mg total) by mouth every 8 (eight) hours as needed for nausea or vomiting. Dissolve under tongue 90 tablet 1  . Semaglutide (OZEMPIC, 0.25 OR 0.5 MG/DOSE, [place]) Inject 0.5 mg into the skin.    Marland Kitchen tiZANidine (ZANAFLEX) 2 MG tablet TAKE 1 TABLET AT BEDTIME DAILY. CAN TAKE 1/2 TABLET IN THE EVERY MORNING AS NEEDED 135 tablet 1  . traMADol (ULTRAM) 50 MG tablet TAKE 1 TABLET BY MOUTH 3 TIMES A DAY AS NEEDED (Patient taking differently: 2 (two) times daily. ) 90 tablet 3   No current facility-administered medications on file prior to visit.     PAST MEDICAL HISTORY: Past Medical History:  Diagnosis Date  . Allergy   . Anemia    DURING MENSES--HAS HEAVY BLEEDING WITH PERODS  . Anxiety   .  Back pain, chronic    "ongoing"  . Blood transfusion    IN 2012  AFTER C -SECTION  . Cerebral thrombosis with cerebral infarction [facility]) JUNE 2011   RIGHT SIDED WEAKNESS ( ARM AND LEG ) AND SPASMS-remains with slight weakness and vertigo.  . Constipation   . Depression   . Diabetes mellitus   . Diabetic neuropathy ([place])    BOTH FEET --COMES AND GOES  . Edema, lower extremity   . Endometriosis   . Fatty liver   . GERD (gastroesophageal reflux disease)    with pregnancy  . H/O eye surgery   . Headache(784.0)    MIGRAINES--NOT REALLY HEADACHE-MORE LIKE PRESSURE SENSATION IN HEAD-FEELS DIZZIY AND  FAINT AS THE PRESSURE RESOLVES  . Hernia, incisional,  RLQ, s/p lap repair Sep 2013 09/02/2011  . History of vertigo 03/22/2018  . Hx of migraines 10/19/2011  . Hypertension   . Leg pain, right    "like bad Charley horse"  . Multiple food allergies   . Panic disorder without agoraphobia   . Rash    HANDS, ARMS --STATES HX OF RASH EVER SINCE CHILDBIRTH/PREGNANCY.  STATES THE RASH OFTEN OCCURS WHEN SHE IS REALLY STRESSED."goes and comes-presently left ring finger"  . Restless leg syndrome    DIAGNOSED BY SLEEP STUDY - PT TOLD SHE DID NOT HAVE SLEEP APNEA  . Right rotator cuff tear    PAIN IN RIGHT SHOULDER  . SBO (small bowel obstruction) ([place]) 06/03/2012  . Shortness of breath   . Spastic hemiplegia affecting dominant side ([place])   . Stomach problems   . Stroke ([place])   . Ventral hernia    RIGHT LOWER QUADRANT-CAUSING SOME PAIN  . Weakness of right side of body     PAST SURGICAL HISTORY: Past Surgical History:  Procedure Laterality Date  . APPLICATION OF WOUND VAC N/A 05/25/2015   Procedure: APPLICATION OF WOUND VAC;  Surgeon: Michael Boston, MD;  Location: [facility];  Service: General;  Laterality: N/A;  . CESAREAN SECTION  2012  . COLONOSCOPY    . DIAGNOSTIC LAPAROSCOPY    . ESOPHAGOGASTRODUODENOSCOPY N/A 06/03/2012   Procedure: ESOPHAGOGASTRODUODENOSCOPY (EGD);  Surgeon: [name] Craver, MD;   Location: [facility] END[facility]COPY;  Service: Endoscopy;  Laterality: N/A;  . EXCISION MASS ABDOMINAL N/A 05/25/2015   Procedure: ABDOMINAL WALL EXPLORATION EXCISION OF SEROMA REMOVAL OF REDUNDANT SKIN ;  Surgeon: Michael Boston, MD;  Location: [facility];  Service: General;  Laterality: N/A;  . EYE SURGERY     Eye laser for vessel hemorrhaging  . fybroid removal    . HERNIA REPAIR  10/03/11   ventral hernia repair  . INSERTION OF MESH N/A 02/04/2013   Procedure: INSERTION OF MESH;  Surgeon: Adin Hector, MD;  [facility];  Service: General;  Laterality: N/A;  . UMBILICAL HERNIA REPAIR N/A 02/04/2013   Procedure: LAPAROSCOPIC ventral wall hernia repair LAPAROSCOPIC LYSIS OF ADHESIONS laparoscopic exploration of abdomen ;  Surgeon: Adin Hector, MD;  Location: [facility];  Service: General;  Laterality: N/A;  . UPPER GASTROINTESTINAL ENDOSCOPY    . URETER REVISION     Bilateral "twisted"  . UTERINE FIBROID SURGERY     2 SURGERIES FOR FIBROIDS  . VENTRAL HERNIA REPAIR  10/03/2011   Procedure: LAPAROSCOPIC VENTRAL HERNIA;  Surgeon: Adin Hector, MD;  [facility];  Service: General;  Laterality: N/A;    SOCIAL HISTORY: Social History   Tobacco Use  . Smoking status: Never Smoker  . Smokeless tobacco: Never Used  Substance Use Topics  . Alcohol use: No    Alcohol/week: 0.0 standard drinks  . Drug use: No    FAMILY HISTORY: Family History  Problem Relation Age of Onset  . Diabetes Father   . Kidney disease Father   . Depression Father   . Drug abuse Father   . Allergic rhinitis Mother   . Eczema Mother   . Urticaria Mother   . Depression Mother   . Anxiety disorder Mother   . Bipolar disorder Mother   . Alcoholism Mother   . Drug abuse Mother   . Eating disorder Mother   . Diabetes Maternal Grandmother   . Hyperlipidemia Paternal Grandmother   . Stroke Paternal Grandmother   . Eczema Sister   .  Urticaria Sister   . Colon cancer Paternal Uncle   . Other Neg Hx   . Angioedema  Neg Hx   . Asthma Neg Hx   . Colon polyps Neg Hx   . Esophageal cancer Neg Hx   . Rectal cancer Neg Hx   . Stomach cancer Neg Hx     ROS: Review of Systems  Constitutional: Positive for malaise/fatigue. Negative for weight loss.  Gastrointestinal: Negative for nausea and vomiting.  Musculoskeletal:       Negative muscle weakness    PHYSICAL EXAM: Pt in no acute distress  RECENT LABS AND TESTS: BMET    Component Value Date/Time   NA 137 04/20/2018 1537   NA 141 11/03/2017 1310   K 3.6 04/20/2018 1537   CL 99 04/20/2018 1537   CO2 25 04/20/2018 1537   GLUCOSE 240 (H) 04/20/2018 1537   BUN 22 (H) 04/20/2018 1537   BUN 11 11/03/2017 1310   CREATININE 1.11 (H) 04/20/2018 1537   CALCIUM 10.1 04/20/2018 1537   GFRNONAA 59 (L) 04/20/2018 1537   GFRAA >60 04/20/2018 1537   Lab Results  Component Value Date   HGBA1C 8.5 (H) 10/20/2018   HGBA1C 8.5 (H) 02/15/2018   HGBA1C 8.4 (H) 11/03/2017   HGBA1C 7.5 (H) 05/18/2015   HGBA1C 8.7 (H) 02/05/2013   No results found for: INSULIN CBC    Component Value Date/Time   WBC 13.1 (H) 04/20/2018 1537   RBC 5.26 (H) 04/20/2018 1537   HGB 13.5 04/20/2018 1537   HCT 42.4 04/20/2018 1537   PLT 348 04/20/2018 1537   MCV 80.6 04/20/2018 1537   MCH 25.7 (L) 04/20/2018 1537   MCHC 31.8 04/20/2018 1537   RDW 14.6 04/20/2018 1537   LYMPHSABS 1.3 05/11/2017 1323   MONOABS 0.6 05/11/2017 1323   EOSABS 0.2 05/11/2017 1323   BASOSABS 0.0 05/11/2017 1323   Iron/TIBC/Ferritin/ %Sat    Component Value Date/Time   IRON 114 11/01/2015 1031   TIBC 372 11/01/2015 1031   FERRITIN 35 11/01/2015 1031   IRONPCTSAT 31 11/01/2015 1031   Lipid Panel     Component Value Date/Time   CHOL 169 02/15/2018 1034   TRIG 168 (H) 02/15/2018 1034   HDL 41 02/15/2018 1034   CHOLHDL 6.0 10/17/2011 0315   VLDL 59 (H) 10/17/2011 0315   LDLCALC 94 02/15/2018 1034   Hepatic Function Panel     Component Value Date/Time   PROT 8.0 04/20/2018 1537    PROT 6.8 11/03/2017 1310   ALBUMIN 3.7 04/20/2018 1537   ALBUMIN 3.8 11/03/2017 1310   AST 18 04/20/2018 1537   ALT 14 04/20/2018 1537   ALKPHOS 60 04/20/2018 1537   BILITOT 0.8 04/20/2018 1537   BILITOT 0.3 11/03/2017 1310   BILIDIR <0.1 06/03/2012 0220   IBILI NOT CALCULATED 06/03/2012 0220      Component Value Date/Time   TSH 0.947 11/03/2017 1310   TSH 2.020 09/08/2017 1129   TSH 1.045 06/03/2012 0520      I, Trixie Dredge, am acting as transcriptionist for Dennard Nip, MD I have reviewed the above documentation for accuracy and completeness, and I agree with the above. -Dennard Nip, MD

## 2018-12-22 ENCOUNTER — Telehealth (INDEPENDENT_AMBULATORY_CARE_PROVIDER_SITE_OTHER): Payer: Federal, State, Local not specified - PPO | Admitting: Family Medicine

## 2018-12-28 ENCOUNTER — Telehealth: Payer: Self-pay | Admitting: Allergy and Immunology

## 2018-12-28 ENCOUNTER — Other Ambulatory Visit: Payer: Self-pay

## 2018-12-28 NOTE — Telephone Encounter (Signed)
steven spouse, ph [phone] called to get refill of montelukast sod 10mg  tablet sent to new pharmacy: [facility] [address] [place]. last ov 10/2017.

## 2018-12-28 NOTE — Telephone Encounter (Signed)
Pt has not been seen in over a year, informed her of this. She stated she was going out of town and needed the refill and stated she would call her pcp about the refill. Dr Neldon Mc is booked until feb but I am willing to put her on anne fnps schedule she stated she would call us back after talking pcp if they can not get her the refill

## 2018-12-30 ENCOUNTER — Other Ambulatory Visit: Payer: Self-pay

## 2018-12-30 ENCOUNTER — Ambulatory Visit (INDEPENDENT_AMBULATORY_CARE_PROVIDER_SITE_OTHER): Payer: Federal, State, Local not specified - PPO | Admitting: Family Medicine

## 2018-12-30 ENCOUNTER — Encounter (INDEPENDENT_AMBULATORY_CARE_PROVIDER_SITE_OTHER): Payer: Self-pay | Admitting: Family Medicine

## 2018-12-30 ENCOUNTER — Encounter (INDEPENDENT_AMBULATORY_CARE_PROVIDER_SITE_OTHER): Payer: Self-pay

## 2018-12-30 ENCOUNTER — Other Ambulatory Visit: Payer: Self-pay | Admitting: Allergy and Immunology

## 2018-12-30 VITALS — BP 135/83 | HR 91 | Temp 97.7°F | Ht 66.0 in | Wt 237.0 lb

## 2018-12-30 DIAGNOSIS — E875 Hyperkalemia: Secondary | ICD-10-CM

## 2018-12-30 DIAGNOSIS — Z9189 Other specified personal risk factors, not elsewhere classified: Secondary | ICD-10-CM

## 2018-12-30 DIAGNOSIS — F3289 Other specified depressive episodes: Secondary | ICD-10-CM

## 2018-12-30 DIAGNOSIS — E1122 Type 2 diabetes mellitus with diabetic chronic kidney disease: Secondary | ICD-10-CM | POA: Diagnosis not present

## 2018-12-30 DIAGNOSIS — Z6838 Body mass index (BMI) 38.0-38.9, adult: Secondary | ICD-10-CM

## 2018-12-30 DIAGNOSIS — E559 Vitamin D deficiency, unspecified: Secondary | ICD-10-CM

## 2018-12-30 MED ORDER — BUPROPION HCL ER (SR) 150 MG PO TB12: 150.0000 mg | ORAL_TABLET | Freq: Every day | ORAL | 0 refills | Status: DC

## 2018-12-30 MED ORDER — VITAMIN D (ERGOCALCIFEROL) 1.25 MG (50000 UNIT) PO CAPS: 50000.0000 [IU] | ORAL_CAPSULE | ORAL | 0 refills | Status: DC

## 2018-12-31 ENCOUNTER — Encounter: Payer: Self-pay | Admitting: Physical Medicine & Rehabilitation

## 2018-12-31 ENCOUNTER — Encounter
Payer: Federal, State, Local not specified - PPO | Attending: Physical Medicine & Rehabilitation | Admitting: Physical Medicine & Rehabilitation

## 2018-12-31 VITALS — BP 127/83 | HR 85 | Temp 97.7°F | Ht 65.5 in | Wt 247.0 lb

## 2018-12-31 DIAGNOSIS — I69351 Hemiplegia and hemiparesis following cerebral infarction affecting right dominant side: Secondary | ICD-10-CM | POA: Diagnosis not present

## 2018-12-31 DIAGNOSIS — M533 Sacrococcygeal disorders, not elsewhere classified: Secondary | ICD-10-CM | POA: Diagnosis not present

## 2018-12-31 MED ORDER — TRAMADOL HCL 50 MG PO TABS: 50.0000 mg | ORAL_TABLET | Freq: Two times a day (BID) | ORAL | 5 refills | Status: DC

## 2018-12-31 MED ORDER — TIZANIDINE HCL 2 MG PO TABS: 2.0000 mg | ORAL_TABLET | Freq: Every day | ORAL | 1 refills | Status: DC

## 2018-12-31 NOTE — Progress Notes (Signed)
Subjective:    Patient ID: Tina [name], female    DOB: 1971/01/01, 48 y.o.   MRN: [id number]  HPI Pt has had problems with elevated K+ due to spironolactone, and had to change to furosemide  Stopped doing exercise and gained 7lb, was walking 2.5 mi (took ~48min)  per day- this was her maximum tolerance before her leg started to drag Started out only walking 1 block   Yoga morning stretches on the floor  Does floor transfers with  assist of couch  Pain Inventory Average Pain 4 Pain Right Now 4 My pain is stabbing  In the last 24 hours, has pain interfered with the following? General activity 8 Relation with others 8 Enjoyment of life 8 What TIME of day is your pain at its worst? all Sleep (in general) Poor  Pain is worse with: walking, standing and some activites Pain improves with: rest, heat/ice, therapy/exercise and medication Relief from Meds: 6  Mobility use a cane ability to climb steps?  yes do you drive?  no  Function disabled: date disabled . I need assistance with the following:  bathing, household duties and shopping  Neuro/Psych weakness numbness spasms dizziness confusion depression anxiety  Prior Studies Any changes since last visit?  no  Physicians involved in your care Any changes since last visit?  no   Family History  Problem Relation Age of Onset  . Diabetes Father   . Kidney disease Father   . Depression Father   . Drug abuse Father   . Allergic rhinitis Mother   . Eczema Mother   . Urticaria Mother   . Depression Mother   . Anxiety disorder Mother   . Bipolar disorder Mother   . Alcoholism Mother   . Drug abuse Mother   . Eating disorder Mother   . Diabetes Maternal Grandmother   . Hyperlipidemia Paternal Grandmother   . Stroke Paternal Grandmother   . Eczema Sister   . Urticaria Sister   . Colon cancer Paternal Uncle   . Other Neg Hx   . Angioedema Neg Hx   . Asthma Neg Hx   . Colon polyps Neg Hx   .  Esophageal cancer Neg Hx   . Rectal cancer Neg Hx   . Stomach cancer Neg Hx    Social History   Socioeconomic History  . Marital status: Married    Spouse name: Annie Main  . Number of children: 3  . Years of education: BA degree  . Highest education level: Not on file  Occupational History  . Occupation: stay at home mom    Employer: UNEMPLOYED  Tobacco Use  . Smoking status: Never Smoker  . Smokeless tobacco: Never Used  Substance and Sexual Activity  . Alcohol use: No    Alcohol/week: 0.0 standard drinks  . Drug use: No  . Sexual activity: Yes    Birth control/protection: None  Other Topics Concern  . Not on file  Social History Narrative   Patient is married with 2 children.   Patient is right handed.   Patient has her  BA degree.   Patient drinks 1 cup daily.   Social Determinants of Health   Financial Resource Strain:   . Difficulty of Paying Living Expenses: Not on file  Food Insecurity: No Food Insecurity  . Worried About Charity fundraiser in the Last Year: Never true  . Ran Out of Food in the Last Year: Never true  Transportation Needs: No Transportation Needs  .  Lack of Transportation (Medical): No  . Lack of Transportation (Non-Medical): No  Physical Activity:   . Days of Exercise per Week: Not on file  . Minutes of Exercise per Session: Not on file  Stress:   . Feeling of Stress : Not on file  Social Connections:   . Frequency of Communication with Friends and Family: Not on file  . Frequency of Social Gatherings with Friends and Family: Not on file  . Attends Religious Services: Not on file  . Active Member of Clubs or Organizations: Not on file  . Attends Archivist Meetings: Not on file  . Marital Status: Not on file   Past Surgical History:  Procedure Laterality Date  . APPLICATION OF WOUND VAC N/A 05/25/2015   Procedure: APPLICATION OF WOUND VAC;  Surgeon: Michael Boston, MD;  [facility];  Service: General;  Laterality: N/A;  .  CESAREAN SECTION  2012  . COLONOSCOPY    . DIAGNOSTIC LAPAROSCOPY    . ESOPHAGOGASTRODUODENOSCOPY N/A 06/03/2012   Procedure: ESOPHAGOGASTRODUODENOSCOPY (EGD);  Surgeon: [name] Craver, MD;  Location: [facility] ENDOSCOPY;  Service: Endoscopy;  Laterality: N/A;  . EXCISION MASS ABDOMINAL N/A 05/25/2015   Procedure: ABDOMINAL WALL EXPLORATION EXCISION OF SEROMA REMOVAL OF REDUNDANT SKIN ;  Surgeon: Michael Boston, MD;  [facility];  Service: General;  Laterality: N/A;  . EYE SURGERY     Eye laser for vessel hemorrhaging  . fybroid removal    . HERNIA REPAIR  10/03/11   ventral hernia repair  . INSERTION OF MESH N/A 02/04/2013   Procedure: INSERTION OF MESH;  Surgeon: Adin Hector, MD;  [facility];  Service: General;  Laterality: N/A;  . UMBILICAL HERNIA REPAIR N/A 02/04/2013   Procedure: LAPAROSCOPIC ventral wall hernia repair LAPAROSCOPIC LYSIS OF ADHESIONS laparoscopic exploration of abdomen ;  Surgeon: Adin Hector, MD;  [facility];  Service: General;  Laterality: N/A;  . UPPER GASTROINTESTINAL ENDOSCOPY    . URETER REVISION     Bilateral "twisted"  . UTERINE FIBROID SURGERY     2 SURGERIES FOR FIBROIDS  . VENTRAL HERNIA REPAIR  10/03/2011   Procedure: LAPAROSCOPIC VENTRAL HERNIA;  Surgeon: Adin Hector, MD;  [facility];  Service: General;  Laterality: N/A;   Past Medical History:  Diagnosis Date  . Allergy   . Anemia    DURING MENSES--HAS HEAVY BLEEDING WITH PERODS  . Anxiety   . Back pain, chronic    "ongoing"  . Blood transfusion    IN 2012  AFTER C -SECTION  . Cerebral thrombosis with cerebral infarction [facility]) JUNE 2011   RIGHT SIDED WEAKNESS ( ARM AND LEG ) AND SPASMS-remains with slight weakness and vertigo.  . Constipation   . Depression   . Diabetes mellitus   . Diabetic neuropathy ([place])    BOTH FEET --COMES AND GOES  . Edema, lower extremity   . Endometriosis   . Fatty liver   . GERD (gastroesophageal reflux disease)    with pregnancy  . H/O eye  surgery   . Headache(784.0)    MIGRAINES--NOT REALLY HEADACHE-MORE LIKE PRESSURE SENSATION IN HEAD-FEELS DIZZIY AND  FAINT AS THE PRESSURE RESOLVES  . Hernia, incisional, RLQ, s/p lap repair Sep 2013 09/02/2011  . History of vertigo 03/22/2018  . Hx of migraines 10/19/2011  . Hypertension   . Leg pain, right    "like bad Charley horse"  . Multiple food allergies   . Panic disorder without agoraphobia   .  Rash    HANDS, ARMS --STATES HX OF RASH EVER SINCE CHILDBIRTH/PREGNANCY.  STATES THE RASH OFTEN OCCURS WHEN SHE IS REALLY STRESSED."goes and comes-presently left ring finger"  . Restless leg syndrome    DIAGNOSED BY SLEEP STUDY - PT TOLD SHE DID NOT HAVE SLEEP APNEA  . Right rotator cuff tear    PAIN IN RIGHT SHOULDER  . SBO (small bowel obstruction) ([place]) 06/03/2012  . Shortness of breath   . Spastic hemiplegia affecting dominant side ([place])   . Stomach problems   . Stroke ([place])   . Ventral hernia    RIGHT LOWER QUADRANT-CAUSING SOME PAIN  . Weakness of right side of body    BP 127/83   Pulse 85   Temp 97.7 F (36.5 C)   Ht 5' 5.5" (1.664 m)   Wt 247 lb (112 kg)   SpO2 98%   BMI 40.48 kg/m   Opioid Risk Score:   Fall Risk Score:  `1  Depression screen PHQ 2/9  Depression screen [facility] 2/9 11/18/2018 05/18/2018 05/13/2018 01/28/2018 01/01/2018 11/03/2017 01/23/2017  Decreased Interest 0 3 1 1 1 2  0  Down, Depressed, Hopeless 1 2 0 0 1 3 0  PHQ - 2 Score 1 5 1 1 2 5  0  Altered sleeping - 3 - - - 2 -  Tired, decreased energy - 3 - - - 3 -  Change in appetite - 3 - - - 3 -  Feeling bad or failure about yourself  - 3 - - - 3 -  Trouble concentrating - 2 - - - 1 -  Moving slowly or fidgety/restless - 0 - - - 0 -  Suicidal thoughts - 0 - - - 1 -  PHQ-9 Score - 19 - - - 18 -  Difficult doing work/chores - - - - - Somewhat difficult -  Some recent data might be hidden     Review of Systems  Constitutional: Positive for diaphoresis and unexpected weight change.  HENT: Negative.     Eyes: Negative.   Respiratory: Negative.   Cardiovascular: Positive for leg swelling.  Gastrointestinal: Positive for constipation, diarrhea, nausea and vomiting.  Endocrine: Negative.   Genitourinary: Negative.   Musculoskeletal: Positive for myalgias.  Skin: Positive for rash.  Allergic/Immunologic: Negative.   Neurological: Positive for dizziness, weakness and numbness.  Hematological: Negative.   Psychiatric/Behavioral: Positive for confusion and dysphoric mood. The patient is nervous/anxious.   All other systems reviewed and are negative.      Objective:   Physical Exam Vitals and nursing note reviewed.  Constitutional:      Appearance: She is obese.  HENT:     Head: Normocephalic and atraumatic.  Eyes:     Extraocular Movements: Extraocular movements intact.     Conjunctiva/sclera: Conjunctivae normal.     Pupils: Pupils are equal, round, and reactive to light.  Musculoskeletal:        General: Tenderness present.     Thoracic back: Decreased range of motion. No scoliosis.     Lumbar back: Tenderness present. No deformity. Decreased range of motion. No scoliosis.  Skin:    General: Skin is warm and dry.  Neurological:     Mental Status: She is alert and oriented to person, place, and time.     Gait: Gait abnormal.  Psychiatric:        Mood and Affect: Mood normal.        Behavior: Behavior normal.        Thought  Content: Thought content normal.        Judgment: Judgment normal.   Tenderness over the right PSIS area Negative straight leg raising Motor strength is 5/5 in the left deltoid bicep tricep grip hip flexor knee extensor ankle dorsiflexor 4/5 in the right deltoid bicep tricep grip hip flexor knee extensor ankle dorsiflexor Gait is using a cane no evidence of toe drag or knee instability        Assessment & Plan:  #1.  History of left pontine, basal ganglia infarcts with right hemiparesis.  No cognitive deficits. Numeral 2.  Sacroiliac dysfunction  right side doing well overall, no need for repeat injections.  She plans to resume her exercise program weight reduction. Discussed not to resume walking at 2.5 miles but to gradually build up starting at 1/2 mile  Last UDS 07/02/2018 consistent Trying duloxetine in place of gabapentin  No spasms in leg in a long time continue tizanidine 2 mg nightly as needed

## 2018-12-31 NOTE — Patient Instructions (Signed)
Start walking slowly again maybe abou a half mile at first

## 2019-01-03 NOTE — Progress Notes (Signed)
Office: [phone]  /  Fax: [phone]   HPI:  Chief Complaint: OBESITY Tina [name] is here to discuss her progress with her obesity treatment plan. She is on the follow the Category 3 plan and states she is following her eating plan approximately 30 % of the time. She states she is exercising 0 minutes 0 times per week.  Tina [name] has been unable to follow her plan closely with her recent health issues. She Tina [name] be going to [place] for 2 weeks over Christmas with family who Tina [name] isolate in the rental home.   Today's visit was #27 Starting weight: 238 lbs Starting date: 11/03/17 Today's weight : Weight: 237 lb (107.5 kg)  Today's date: 12/30/18 Total lbs lost to date: 1 lb Total lbs lost since last in-office visit: 0  Vitamin D Deficiency  Tina [name] has a diagnosis of vit D deficiency. Vit d drawn at PCP was brought in and reviewed by myself. Discussed results with her today. Her vit D level is not at goal yet.  At risk for osteopenia and osteoporosis Tina [name] is at higher risk of osteopenia and osteoporosis due to vitamin D deficiency.   Emotional Eating Behaviors (other depression)  Tina [name] has emotional eating behaviors. She is tolerating Wellbutrin well, but reports some issues with fatigue and excessive sleep but this appears to be due to her increased potassium level.   Hyperkalemia Tina [name] has increased potassium of 7, followed by her PCP who discontinued her spironolactone. She has questions has potassium rich foods. She had her potassium level checked today, but no results back yet   ASSESSMENT AND PLAN:  Vitamin D deficiency - Plan: Vitamin D, Ergocalciferol, (DRISDOL) 1.25 MG (50000 UT) CAPS capsule  Hyperkalemia  Other depression - with emotional eating - Plan: buPROPion (WELLBUTRIN SR) 150 MG 12 hr tablet  At risk for osteoporosis  Class 2 severe obesity with serious comorbidity and body mass index (BMI) of 38.0 to 38.9 in adult, unspecified obesity type  (HCC)  PLAN:  Vitamin D Deficiency Low vitamin D level contributes to fatigue and are associated with obesity, breast, and colon cancer. She agrees to continue to take prescription Vit D @50 ,000 IU every week #4 with no refills sent in today and Tina [name] follow up for routine testing of vitamin D, at least 2-3 times per year to avoid over-replacement.  At risk for osteopenia and osteoporosis Tina [name] was given extended  (15 minutes) osteoporosis prevention counseling today. Tina [name] is at risk for osteopenia and osteoporosis due to her vitamin D deficiency. She was encouraged to take her vitamin D and follow her higher calcium diet and increase strengthening exercise to help strengthen her bones and decrease her risk of osteopenia and osteoporosis.  Emotional Eating Behaviors (other depression) Behavior modification techniques were discussed today to help Tina [name] deal with her emotional/non-hunger eating behaviors. Tina [name] continue to follow and monitor her progress. Wellbutrin 150 mg daily #30 with no refills sent in today and Tina [name] continue to follow.   Hyperkalemia Educated Tina [name] on high potassium foods and to minimize how much of these she eats, Tina [name] continue to monitor.   Obesity Tina [name] is currently in the action stage of change. As such, her goal is to continue with weight loss efforts She has agreed to follow the Category 3 plan Tina [name] Tina [name] work up to a goal of 150 minutes of combined cardio and strengthening exercise per week for weight loss and overall health benefits. We discussed the following Behavioral Modification Strategies today: holiday eating strategies  Tina [name] has agreed to follow up with our clinic in 4 weeks. She was informed of the importance of frequent follow up visits to maximize her success with intensive lifestyle modifications for her multiple health conditions.  ALLERGIES: Allergies  Allergen Reactions  . Contrast Media [Iodinated Diagnostic Agents] Other (See  Comments)    Difficulty breathing  . Iohexol Hives, Nausea And Vomiting and Swelling     Desc: Magnevist-gadolinium-difficulty breathing, throat swelling   . Midazolam Hcl Anaphylaxis    Difficulty breathing  . Shellfish Allergy Anaphylaxis  . Valsartan Swelling  . Metformin And Related Diarrhea and Nausea And Vomiting  . Other Itching    Patient is allergic to all nuts except peanuts.   Marland Kitchen Spironolactone Other (See Comments)    Elevated potassium   . Avandia [Rosiglitazone Maleate] Hives and Other (See Comments)  . Geodon [Ziprasidone] Other (See Comments)    UNKNOWN  . Kiwi Extract Itching and Swelling  . Latex Itching    MEDICATIONS: Current Outpatient Medications on File Prior to Visit  Medication Sig Dispense Refill  . allopurinol (ZYLOPRIM) 100 MG tablet Take 100 mg by mouth every evening.    Marland Kitchen amLODipine (NORVASC) 10 MG tablet TAKE 1 TABLET BY MOUTH EVERY DAY 90 tablet 1  . aspirin 325 MG tablet Take 325 mg by mouth at bedtime.     Marland Kitchen atorvastatin (LIPITOR) 10 MG tablet Take 1 tablet (10 mg total) by mouth daily. 30 tablet 0  . cetirizine (ZYRTEC) 10 MG tablet Take 10 mg by mouth daily as needed for allergies.   11  . cloNIDine (CATAPRES) 0.1 MG tablet Take 0.1 mg by mouth 2 (two) times daily.  0  . colchicine 0.6 MG tablet Take 0.6 mg by mouth daily as needed (For gout flare-up.).   1  . DULoxetine (CYMBALTA) 20 MG capsule Take 20 mg by mouth daily. 20 mg am, 40 mg pm    . EPINEPHrine 0.3 mg/0.3 mL IJ SOAJ injection INJECT AS DIRECTED AS NEEDED FOR ALLERGIC REACTION 1 Device 2  . fluticasone (FLONASE) 50 MCG/ACT nasal spray daily as needed.     . furosemide (LASIX) 40 MG tablet Take 40 mg by mouth.    . insulin aspart (NOVOLOG) 100 UNIT/ML injection Inject 5-15 Units into the skin 3 (three) times daily with meals. Per sliding scale--pt uses Omnipod and gets a basal rate     . Insulin Glargine (BASAGLAR KWIKPEN) 100 UNIT/ML SOPN Inject 18 Units into the skin daily.     Marland Kitchen  omeprazole (PRILOSEC) 40 MG capsule TAKE 1 CAPSULE BY MOUTH EVERY DAY 90 capsule 1  . ondansetron (ZOFRAN-ODT) 4 MG disintegrating tablet Take 1 tablet (4 mg total) by mouth every 8 (eight) hours as needed for nausea or vomiting. Dissolve under tongue 90 tablet 1  . Semaglutide (OZEMPIC, 0.25 OR 0.5 MG/DOSE, [place]) Inject 0.5 mg into the skin.     No current facility-administered medications on file prior to visit.    PAST MEDICAL HISTORY: Past Medical History:  Diagnosis Date  . Allergy   . Anemia    DURING MENSES--HAS HEAVY BLEEDING WITH PERODS  . Anxiety   . Back pain, chronic    "ongoing"  . Blood transfusion    IN 2012  AFTER C -SECTION  . Cerebral thrombosis with cerebral infarction [facility]) JUNE 2011   RIGHT SIDED WEAKNESS ( ARM AND LEG ) AND SPASMS-remains with slight weakness and vertigo.  . Constipation   . Depression   . Diabetes  mellitus   . Diabetic neuropathy ([place])    BOTH FEET --COMES AND GOES  . Edema, lower extremity   . Endometriosis   . Fatty liver   . GERD (gastroesophageal reflux disease)    with pregnancy  . H/O eye surgery   . Headache(784.0)    MIGRAINES--NOT REALLY HEADACHE-MORE LIKE PRESSURE SENSATION IN HEAD-FEELS DIZZIY AND  FAINT AS THE PRESSURE RESOLVES  . Hernia, incisional, RLQ, s/p lap repair Sep 2013 09/02/2011  . History of vertigo 03/22/2018  . Hx of migraines 10/19/2011  . Hypertension   . Leg pain, right    "like bad Charley horse"  . Multiple food allergies   . Panic disorder without agoraphobia   . Rash    HANDS, ARMS --STATES HX OF RASH EVER SINCE CHILDBIRTH/PREGNANCY.  STATES THE RASH OFTEN OCCURS WHEN SHE IS REALLY STRESSED."goes and comes-presently left ring finger"  . Restless leg syndrome    DIAGNOSED BY SLEEP STUDY - PT TOLD SHE DID NOT HAVE SLEEP APNEA  . Right rotator cuff tear    PAIN IN RIGHT SHOULDER  . SBO (small bowel obstruction) ([place]) 06/03/2012  . Shortness of breath   . Spastic hemiplegia affecting dominant side ([place])     . Stomach problems   . Stroke ([place])   . Ventral hernia    RIGHT LOWER QUADRANT-CAUSING SOME PAIN  . Weakness of right side of body     PAST SURGICAL HISTORY: Past Surgical History:  Procedure Laterality Date  . APPLICATION OF WOUND VAC N/A 05/25/2015   Procedure: APPLICATION OF WOUND VAC;  Surgeon: Michael Boston, MD;  Location: [facility];  Service: General;  Laterality: N/A;  . CESAREAN SECTION  2012  . COLONOSCOPY    . DIAGNOSTIC LAPAROSCOPY    . ESOPHAGOGASTRODUODENOSCOPY N/A 06/03/2012   Procedure: ESOPHAGOGASTRODUODENOSCOPY (EGD);  Surgeon: [name] Craver, MD;  Location: [facility];  Service: Endoscopy;  Laterality: N/A;  . EXCISION MASS ABDOMINAL N/A 05/25/2015   Procedure: ABDOMINAL WALL EXPLORATION EXCISION OF SEROMA REMOVAL OF REDUNDANT SKIN ;  Surgeon: Michael Boston, MD;  Location: [facility];  Service: General;  Laterality: N/A;  . EYE SURGERY     Eye laser for vessel hemorrhaging  . fybroid removal    . HERNIA REPAIR  10/03/11   ventral hernia repair  . INSERTION OF MESH N/A 02/04/2013   Procedure: INSERTION OF MESH;  Surgeon: Adin Hector, MD;  Location: [facility];  Service: General;  Laterality: N/A;  . UMBILICAL HERNIA REPAIR N/A 02/04/2013   Procedure: LAPAROSCOPIC ventral wall hernia repair LAPAROSCOPIC LYSIS OF ADHESIONS laparoscopic exploration of abdomen ;  Surgeon: Adin Hector, MD;  Location: [facility];  Service: General;  Laterality: N/A;  . UPPER GASTROINTESTINAL ENDOSCOPY    . URETER REVISION     Bilateral "twisted"  . UTERINE FIBROID SURGERY     2 SURGERIES FOR FIBROIDS  . VENTRAL HERNIA REPAIR  10/03/2011   Procedure: LAPAROSCOPIC VENTRAL HERNIA;  Surgeon: Adin Hector, MD;  Location: [facility];  Service: General;  Laterality: N/A;    SOCIAL HISTORY: Social History   Tobacco Use  . Smoking status: Never Smoker  . Smokeless tobacco: Never Used  Substance Use Topics  . Alcohol use: No    Alcohol/week: 0.0 standard drinks  . Drug use: No    FAMILY  HISTORY: Family History  Problem Relation Age of Onset  . Diabetes Father   . Kidney disease Father   . Depression Father   . Drug abuse Father   .  Allergic rhinitis Mother   . Eczema Mother   . Urticaria Mother   . Depression Mother   . Anxiety disorder Mother   . Bipolar disorder Mother   . Alcoholism Mother   . Drug abuse Mother   . Eating disorder Mother   . Diabetes Maternal Grandmother   . Hyperlipidemia Paternal Grandmother   . Stroke Paternal Grandmother   . Eczema Sister   . Urticaria Sister   . Colon cancer Paternal Uncle   . Other Neg Hx   . Angioedema Neg Hx   . Asthma Neg Hx   . Colon polyps Neg Hx   . Esophageal cancer Neg Hx   . Rectal cancer Neg Hx   . Stomach cancer Neg Hx     ROS: Review of Systems  Constitutional: Positive for malaise/fatigue. Negative for weight loss.    PHYSICAL EXAM: Blood pressure 135/83, pulse 91, temperature 97.7 F (36.5 C), temperature source Oral, height 5\' 6"  (1.676 m), weight 237 lb (107.5 kg), last menstrual period 11/30/2018, SpO2 100 %. Body mass index is 38.25 kg/m. Physical Exam Vitals reviewed.  Constitutional:      Appearance: Normal appearance.  HENT:     Head: Normocephalic.  Cardiovascular:     Rate and Rhythm: Normal rate.  Pulmonary:     Effort: Pulmonary effort is normal.  Musculoskeletal:        General: Normal range of motion.  Skin:    General: Skin is warm and dry.  Neurological:     Mental Status: She is alert and oriented to person, place, and time.  Psychiatric:        Mood and Affect: Mood normal.        Behavior: Behavior normal.     RECENT LABS AND TESTS: BMET    Component Value Date/Time   NA 138 12/08/2018 0000   K 7.1 (A) 12/08/2018 0000   CL 105 12/08/2018 0000   CO2 24 (A) 12/08/2018 0000   GLUCOSE 240 (H) 04/20/2018 1537   BUN 22 (H) 04/20/2018 1537   BUN 11 11/03/2017 1310   CREATININE 1.3 (A) 12/08/2018 0000   CREATININE 1.11 (H) 04/20/2018 1537   CALCIUM 10.1  04/20/2018 1537   GFRNONAA 59 (L) 04/20/2018 1537   GFRAA >60 04/20/2018 1537   Lab Results  Component Value Date   HGBA1C 8.4 12/08/2018   HGBA1C 8.5 (H) 10/20/2018   HGBA1C 8.5 (H) 02/15/2018   HGBA1C 8.4 (H) 11/03/2017   HGBA1C 7.5 (H) 05/18/2015   No results found for: INSULIN CBC    Component Value Date/Time   WBC 6.3 12/08/2018 0000   WBC 13.1 (H) 04/20/2018 1537   RBC 4.4 12/08/2018 0000   HGB 11.3 (A) 12/08/2018 0000   HCT 35 (A) 12/08/2018 0000   PLT 348 04/20/2018 1537   MCV 80.6 04/20/2018 1537   MCH 25.7 (L) 04/20/2018 1537   MCHC 31.8 04/20/2018 1537   RDW 14.6 04/20/2018 1537   LYMPHSABS 1.3 05/11/2017 1323   MONOABS 0.6 05/11/2017 1323   EOSABS 0.2 05/11/2017 1323   BASOSABS 0.0 05/11/2017 1323   Iron/TIBC/Ferritin/ %Sat    Component Value Date/Time   IRON 114 11/01/2015 1031   TIBC 372 11/01/2015 1031   FERRITIN 35 11/01/2015 1031   IRONPCTSAT 31 11/01/2015 1031   Lipid Panel     Component Value Date/Time   CHOL 204 (A) 12/08/2018 0000   CHOL 169 02/15/2018 1034   TRIG 323 (A) 12/08/2018 0000   HDL 48  12/08/2018 0000   HDL 41 02/15/2018 1034   CHOLHDL 6.0 10/17/2011 0315   VLDL 59 (H) 10/17/2011 0315   LDLCALC 91 12/08/2018 0000   LDLCALC 94 02/15/2018 1034   Hepatic Function Panel     Component Value Date/Time   PROT 8.0 04/20/2018 1537   PROT 6.8 11/03/2017 1310   ALBUMIN 3.7 04/20/2018 1537   ALBUMIN 3.8 11/03/2017 1310   AST 14 12/08/2018 0000   ALT 18 12/08/2018 0000   ALKPHOS 60 04/20/2018 1537   BILITOT 0.8 04/20/2018 1537   BILITOT 0.3 11/03/2017 1310   BILIDIR <0.1 06/03/2012 0220   IBILI NOT CALCULATED 06/03/2012 0220      Component Value Date/Time   TSH 0.947 11/03/2017 1310   TSH 2.020 09/08/2017 1129   TSH 1.045 06/03/2012 0520     I, Renee Ramus, am acting as Location manager for Dennard Nip, MD  I have reviewed the above documentation for accuracy and completeness, and I agree with the above. -Dennard Nip, MD

## 2019-01-11 DIAGNOSIS — N644 Mastodynia: Secondary | ICD-10-CM | POA: Diagnosis not present

## 2019-01-25 ENCOUNTER — Other Ambulatory Visit: Payer: Self-pay

## 2019-01-25 ENCOUNTER — Encounter (INDEPENDENT_AMBULATORY_CARE_PROVIDER_SITE_OTHER): Payer: Self-pay | Admitting: Bariatrics

## 2019-01-25 ENCOUNTER — Telehealth (INDEPENDENT_AMBULATORY_CARE_PROVIDER_SITE_OTHER): Payer: Federal, State, Local not specified - PPO | Admitting: Bariatrics

## 2019-01-25 DIAGNOSIS — Z6838 Body mass index (BMI) 38.0-38.9, adult: Secondary | ICD-10-CM | POA: Diagnosis not present

## 2019-01-25 DIAGNOSIS — E559 Vitamin D deficiency, unspecified: Secondary | ICD-10-CM | POA: Diagnosis not present

## 2019-01-25 DIAGNOSIS — F3289 Other specified depressive episodes: Secondary | ICD-10-CM

## 2019-01-25 MED ORDER — BUPROPION HCL ER (SR) 150 MG PO TB12: 150.0000 mg | ORAL_TABLET | Freq: Every day | ORAL | 0 refills | Status: DC

## 2019-01-25 MED ORDER — VITAMIN D (ERGOCALCIFEROL) 1.25 MG (50000 UNIT) PO CAPS: 50000.0000 [IU] | ORAL_CAPSULE | ORAL | 0 refills | Status: DC

## 2019-01-27 NOTE — Progress Notes (Signed)
TeleHealth Visit:  Due to the COVID-19 pandemic, this visit was completed with telemedicine (audio/video) technology to reduce patient and provider exposure as well as to preserve personal protective equipment.   Tina [name] has verbally consented to this TeleHealth visit. The patient is located at home, the provider is located at the [facility] office. The participants in this visit include the listed provider and patient. The visit was conducted today via WebEx.  Chief Complaint: OBESITY Tina [name] is here to discuss her progress with her obesity treatment plan along with follow-up of her obesity related diagnoses. Tina [name] is on the Category 3 Plan and states she is following her eating plan approximately 30% of the time. Tina [name] states she is exercising for 0 minutes 0 times per week.  Today's visit was #: 28 Starting weight: 238 lbs Starting date: 11/03/2017  Interim History: Tina [name] is up 4 pounds from over the holidays (weight 231 pounds).  She states that being at home makes it harder to stay on the plan.  It is harder when not on a routine.    Subjective:   1. Vitamin D deficiency Tina [name]'s Vitamin D level was 28.3 on 10/20/2018. She is currently taking vit D. She denies nausea, vomiting or muscle weakness.  2. Other depression, with emotional eating Tina [name] is struggling with emotional eating and using food for comfort to the extent that it is negatively impacting her health. She often snacks when she is not hungry. Tina [name] sometimes feels she is out of control and then feels guilty that she made poor food choices. She has been working on behavior modification techniques to help reduce her emotional eating and has been unsuccessful. She shows no sign of suicidal or homicidal ideations.  Assessment/Plan:   1. Vitamin D deficiency Low Vitamin D level contributes to fatigue and are associated with obesity, breast, and colon cancer. She agrees to  continue to take prescription Vitamin D @50 ,000 IU every week and will follow-up for routine testing of vitamin D, at least 2-3 times per year to avoid over-replacement. - Vitamin D, Ergocalciferol, (DRISDOL) 1.25 MG (50000 UNIT) CAPS capsule; Take 1 capsule (50,000 Units total) by mouth every 7 (seven) days.  Dispense: 4 capsule; Refill: 0  2. Other depression, with emotional eating Behavior modification techniques were discussed today to help Tina [name] deal with her emotional/non-hunger eating behaviors.  Orders and follow up as documented in patient record.  - buPROPion (WELLBUTRIN SR) 150 MG 12 hr tablet; Take 1 tablet (150 mg total) by mouth daily.  Dispense: 30 tablet; Refill: 0  3. Class 2 severe obesity with serious comorbidity and body mass index (BMI) of 38.0 to 38.9 in adult, unspecified obesity type (Tina [name]) Nomie is currently in the action stage of change. As such, her goal is to continue with weight loss efforts. She has agreed to on the Category 3 Plan.   We discussed the following exercise goals today: The patient will walk at home and will use a FitBit.  We discussed the following behavioral modification strategies today: increasing lean protein intake, decreasing simple carbohydrates, increasing vegetables, increasing water intake, decreasing eating out, no skipping meals, meal planning and cooking strategies, keeping healthy foods in the home and planning for success.  Tina [name] will work on meal planning and intentional eating.  She will do more preparation for the day and will not skip meals.  Tina [name] has agreed to follow-up with our clinic in 2-3 weeks. She was informed of  the importance of frequent follow-up visits to maximize her success with intensive lifestyle modifications for her multiple health conditions.  Objective:   VITALS: Per patient if applicable, see vitals. GENERAL: Alert and in no acute distress. CARDIOPULMONARY: No increased WOB. Speaking in clear  sentences.  PSYCH: Pleasant and cooperative. Speech normal rate and rhythm. Affect is appropriate. Insight and judgement are appropriate. Attention is focused, linear, and appropriate.  NEURO: Oriented as arrived to appointment on time with no prompting.   Lab Results  Component Value Date   CREATININE 1.3 (A) 12/08/2018   BUN 22 (H) 04/20/2018   NA 138 12/08/2018   K 7.1 (A) 12/08/2018   CL 105 12/08/2018   CO2 24 (A) 12/08/2018   Lab Results  Component Value Date   ALT 18 12/08/2018   AST 14 12/08/2018   ALKPHOS 60 04/20/2018   BILITOT 0.8 04/20/2018   Lab Results  Component Value Date   HGBA1C 8.4 12/08/2018   HGBA1C 8.5 (H) 10/20/2018   HGBA1C 8.5 (H) 02/15/2018   HGBA1C 8.4 (H) 11/03/2017   HGBA1C 7.5 (H) 05/18/2015   No results found for: INSULIN Lab Results  Component Value Date   TSH 0.947 11/03/2017   Lab Results  Component Value Date   CHOL 204 (A) 12/08/2018   HDL 48 12/08/2018   LDLCALC 91 12/08/2018   TRIG 323 (A) 12/08/2018   CHOLHDL 6.0 10/17/2011   Lab Results  Component Value Date   WBC 6.3 12/08/2018   HGB 11.3 (A) 12/08/2018   HCT 35 (A) 12/08/2018   MCV 80.6 04/20/2018   PLT 348 04/20/2018   Lab Results  Component Value Date   IRON 114 11/01/2015   TIBC 372 11/01/2015   FERRITIN 35 11/01/2015    Attestation Statements:   Reviewed by clinician on day of visit: allergies, medications, problem list, medical history, surgical history, family history, social history, and previous encounter notes.  I, Water quality scientist, CMA, am acting as Location manager for [employer], DO.  I have reviewed the above documentation for accuracy and completeness, and I agree with the above. Jearld Lesch, DO

## 2019-02-07 ENCOUNTER — Ambulatory Visit: Payer: Self-pay

## 2019-02-09 ENCOUNTER — Other Ambulatory Visit: Payer: Self-pay

## 2019-02-09 ENCOUNTER — Other Ambulatory Visit: Payer: Self-pay | Admitting: Gastroenterology

## 2019-02-09 ENCOUNTER — Telehealth: Payer: Self-pay | Admitting: *Deleted

## 2019-02-09 DIAGNOSIS — R109 Unspecified abdominal pain: Secondary | ICD-10-CM

## 2019-02-09 NOTE — Patient Outreach (Signed)
[facility]) Care Management Chronic Special Needs Program  02/09/2019  Name: Tina [name] DOB: 27-Sep-1970  MRN: [id number]  [name] is enrolled in a chronic special needs plan for Diabetes. Chronic Care Management Coordinator telephoned client to review health risk assessment and to develop individualized care plan.  Reviewed the chronic care management program, importance of client participation, and taking their care plan to all provider appointments and inpatient facilities.  Reviewed the transition of care process and possible referral to community care management.  Subjective: Client sttes that she did not follow her food plan as well during the holidays but she is now back on track.  STates she is planning ahead and preping her meals ahead so she has something on plan when she is tired.  STaets she is still going to the [facility].  States she is to see her endocrinologist next month.  States her blood sugars range from 110-120 in the morning and 178-200 later in the day.  States she has had a couple of low readings in the 50's this last month.  States that she did not eat as much as she had planned and dosed her insulin.  States she is doing yoga every morning which helps with stress.  states she is still very busy with her young children and taking an online college course.  States she had 2 falls this month when she was dizzy.  States she and her husband completed their Advanced Directive forms and had them notarized   Goals Addressed            This Visit's Progress   . COMPLETED: Advanced Care Planning complete by the next 6 months(continue 11/18/18)   On track    Reports Advanced Directives completed and notarized  Take copy to your primary care doctor to put in your records    . Client understands the importance of follow-up with providers by attending scheduled visits   On track    Plan to keep appointments with  providers    . Client will not report change from baseline and no repeated symptoms of stroke with in the next 6 months( continue 02/09/19)   On track    Reports no stroke symptoms    . Client will report no fall or injuries in the next 6 months.(continue 11/18/18)   Not on track    Reports 2 falls in January Stand up slowly Use cane when unsteady Use grabber to pick up objects on the floor    . Client will use Assistive Devices as needed and verbalize understanding of device use   On track    Using [facility] without issues    . Client will verbalize knowledge of self management of Hypertension as evidences by BP reading of 140/90 or less; or as defined by provider   On track    Reports B/P less than 140/90 when checked    . Diabetes Patient stated goal to lose 10 lbs in the next 3 months( continue goal 02/08/18) (pt-stated)   Not on track    Continue to follow low carbohydrate diet as advised from the [facility]      . HEMOGLOBIN A1C < 7.0       Last Hemoglobin A1C  8.4% on 12/08/18 Check blood sugars daily either fasting or 1 1/2 hours after eating with goal of 80-130 fasting and 180 or less after meals Diabetes self management actions:  Glucose monitoring per provider recommendations  Eat Healthy  Check feet daily  Visit provider every 3-6 months as directed  Hbg A1C level every 3-6 months.  Eye Exam yearly    . Maintain timely refills of diabetic medication as prescribed within the year .   On track    Maintaining timely refills of medications per dispense report    . Obtain annual  Lipid Profile, LDL-C   On track    Completed 02/15/18 The goal for LDL is less than 70 mg/dL as you are at high risk for complications    . Obtain Annual Eye (retinal)  Exam    On track    Completed 10/19/18 Plan to have a dilated eye exam every year    . Obtain Annual Foot Exam   On track    Check your skin and feet every day for cuts, bruises, redness,  blisters, or sores. Schedule a foot exam with your health care provider once every year.    . Obtain annual screen for micro albuminuria (urine) , nephropathy (kidney problems)   On track    It is important for your doctor to check your urine for protein at least every year    . Obtain Hemoglobin A1C at least 2 times per year   On track    It is important to have your Hemoglobin A1C checked every 6 months if you are at goal and every 3 months if you are not at goal    . Visit Primary Care Provider or Endocrinologist at least 2 times per year    On track    Plan to keep scheduled appointments with providers     Client is not meeting diabetes self management goal of hemoglobin A1C of <7% with last reading of 8.4% Client has now completed her Advanced Directives and encouraged to give copy to her provider Client declines pharmacy referral for polypharmacy and denies issues with medication cost as she uses her husbands insurance for medications. Reinforced safety and fall precautions Reinforced to follow her food plan from [facility] and to continue to plan her meals Encouraged to increase her activity as tolerated and to continue her yoga to help with her balance.   Reviewed number for 24-hour nurse Line Reviewed COVID 19 precautions Encouraged to sign up for COVID vaccination when possible she is eligible  Plan:  Send successful outreach letter with a copy of their individualized care plan, Send individual care plan to provider and Send educational material  Chronic care management coordination will outreach in:  6 Months     Peter Garter RN, [facility], [place] Management Coordinator [place] [facility] [phone]

## 2019-02-10 ENCOUNTER — Ambulatory Visit (INDEPENDENT_AMBULATORY_CARE_PROVIDER_SITE_OTHER): Payer: Federal, State, Local not specified - PPO | Admitting: Bariatrics

## 2019-02-10 ENCOUNTER — Other Ambulatory Visit (INDEPENDENT_AMBULATORY_CARE_PROVIDER_SITE_OTHER): Payer: Self-pay | Admitting: Family Medicine

## 2019-02-10 ENCOUNTER — Other Ambulatory Visit: Payer: Self-pay

## 2019-02-10 ENCOUNTER — Encounter (INDEPENDENT_AMBULATORY_CARE_PROVIDER_SITE_OTHER): Payer: Self-pay | Admitting: Bariatrics

## 2019-02-10 VITALS — BP 173/100 | HR 98 | Temp 98.7°F | Ht 66.0 in | Wt 238.0 lb

## 2019-02-10 DIAGNOSIS — E119 Type 2 diabetes mellitus without complications: Secondary | ICD-10-CM | POA: Diagnosis not present

## 2019-02-10 DIAGNOSIS — I10 Essential (primary) hypertension: Secondary | ICD-10-CM

## 2019-02-10 DIAGNOSIS — Z794 Long term (current) use of insulin: Secondary | ICD-10-CM | POA: Diagnosis not present

## 2019-02-10 DIAGNOSIS — F3289 Other specified depressive episodes: Secondary | ICD-10-CM | POA: Diagnosis not present

## 2019-02-10 DIAGNOSIS — Z6838 Body mass index (BMI) 38.0-38.9, adult: Secondary | ICD-10-CM | POA: Diagnosis not present

## 2019-02-10 MED ORDER — BUPROPION HCL ER (SR) 150 MG PO TB12: 150.0000 mg | ORAL_TABLET | Freq: Every day | ORAL | 0 refills | Status: DC

## 2019-02-10 MED ORDER — GABAPENTIN 300 MG PO CAPS: 600.0000 mg | ORAL_CAPSULE | Freq: Three times a day (TID) | ORAL | 2 refills | Status: DC

## 2019-02-10 NOTE — Progress Notes (Signed)
Chief Complaint:   OBESITY Tina [name] is here to discuss her progress with her obesity treatment plan along with follow-up of her obesity related diagnoses. Tina [name] is on the Category 3 Plan and states she is following her eating plan approximately 45% of the time. Tina [name] states she is doing yoga 20 minutes 7 times per week.  Today's visit was #: 24 Starting weight: 238 lbs Starting date: 11/03/2017 Today's weight: 238 lbs  Today's date: 02/10/2019 Total lbs lost to date: 0 Total lbs lost since last in-office visit: 0  Interim History: Tina [name] is up 1 lb. She has severe vertigo with nausea and vomiting and is using Zofran.  Subjective:   Essential hypertension. Tina [name] had been on spironolactone, but stopped due to increased potassium. Blood pressure is not controlled; she states she is working with her PCP on her blood pressure. She is taking Catapres.   BP Readings from Last 3 Encounters:  02/10/19 (!) 173/100  12/31/18 127/83  12/30/18 135/83   Lab Results  Component Value Date   CREATININE 1.3 (A) 12/08/2018   CREATININE 1.11 (H) 04/20/2018   CREATININE 0.70 11/03/2017   Type 2 diabetes mellitus without complication, with long-term current use of insulin ([place]). Tina [name] is taking NovoLog, Basaglar, and Ozempic. She states fasting blood sugars range between 90 and 120's with 2-hour postprandials ranging between 140's and 170's, with the highest being 220. She reports having 2 lows (50, 54).  Lab Results  Component Value Date   HGBA1C 8.4 12/08/2018   HGBA1C 8.5 (H) 10/20/2018   HGBA1C 8.5 (H) 02/15/2018   Lab Results  Component Value Date   LDLCALC 91 12/08/2018   CREATININE 1.3 (A) 12/08/2018   No results found for: INSULIN  Other depression, with emotional eating. Tina [name] is struggling with emotional eating and using food for comfort to the extent that it is negatively impacting her health. She has been working on behavior modification techniques to  help reduce her emotional eating and has been somewhat successful. She shows no sign of suicidal or homicidal ideations.   Assessment/Plan:   Essential hypertension. Tina [name] is working on healthy weight loss and exercise to improve blood pressure control. We will watch for signs of hypotension as she continues her lifestyle modifications. She will call her PCP today to add another medication.  Type 2 diabetes mellitus without complication, with long-term current use of insulin ([place]). Good blood sugar control is important to decrease the likelihood of diabetic complications such as nephropathy, neuropathy, limb loss, blindness, coronary artery disease, and death. Intensive lifestyle modification including diet, exercise and weight loss are the first line of treatment for diabetes. Tina [name] will continue her medications. She will decrease carbohydrates and increase healthy fats and protein.  Other depression, with emotional eating. Behavior modification techniques were discussed today to help Tina [name] deal with her emotional/non-hunger eating behaviors.  Orders and follow up as documented in patient record. She was given a prescription for buPROPion (WELLBUTRIN SR) 150 MG 12 hr tablet 1 PO daily #30 with 0 refills.  Class 2 severe obesity with serious comorbidity and body mass index (BMI) of 38.0 to 38.9 in adult, unspecified obesity type ([place]).  Tina [name] is currently in the action stage of change. As such, her goal is to continue with weight loss efforts. She has agreed to the Category 3 Plan.   She will work on meal planning and will be more adherent to the plan.  Exercise goals: All adults should avoid inactivity. Some  physical activity is better than none, and adults who participate in any amount of physical activity gain some health benefits.  Behavioral modification strategies: increasing lean protein intake, decreasing simple carbohydrates, increasing vegetables, increasing water intake,  decreasing eating out, no skipping meals, meal planning and cooking strategies and keeping healthy foods in the home.  Tina [name] has agreed to follow-up with our clinic in 2 weeks. She was informed of the importance of frequent follow-up visits to maximize her success with intensive lifestyle modifications for her multiple health conditions.   Objective:   Blood pressure (!) 173/100, pulse 98, temperature 98.7 F (37.1 C), height 5\' 6"  (1.676 m), weight 238 lb (108 kg), SpO2 100 %. Body mass index is 38.41 kg/m.  General: Cooperative, alert, well developed, in no acute distress. HEENT: Conjunctivae and lids unremarkable. Cardiovascular: Regular rhythm.  Lungs: Normal work of breathing. Neurologic: No focal deficits.   Lab Results  Component Value Date   CREATININE 1.3 (A) 12/08/2018   BUN 22 (H) 04/20/2018   NA 138 12/08/2018   K 7.1 (A) 12/08/2018   CL 105 12/08/2018   CO2 24 (A) 12/08/2018   Lab Results  Component Value Date   ALT 18 12/08/2018   AST 14 12/08/2018   ALKPHOS 60 04/20/2018   BILITOT 0.8 04/20/2018   Lab Results  Component Value Date   HGBA1C 8.4 12/08/2018   HGBA1C 8.5 (H) 10/20/2018   HGBA1C 8.5 (H) 02/15/2018   HGBA1C 8.4 (H) 11/03/2017   HGBA1C 7.5 (H) 05/18/2015   No results found for: INSULIN Lab Results  Component Value Date   TSH 0.947 11/03/2017   Lab Results  Component Value Date   CHOL 204 (A) 12/08/2018   HDL 48 12/08/2018   LDLCALC 91 12/08/2018   TRIG 323 (A) 12/08/2018   CHOLHDL 6.0 10/17/2011   Lab Results  Component Value Date   WBC 6.3 12/08/2018   HGB 11.3 (A) 12/08/2018   HCT 35 (A) 12/08/2018   MCV 80.6 04/20/2018   PLT 348 04/20/2018   Lab Results  Component Value Date   IRON 114 11/01/2015   TIBC 372 11/01/2015   FERRITIN 35 11/01/2015   Attestation Statements:   Reviewed by clinician on day of visit: allergies, medications, problem list, medical history, surgical history, family history, social history, and  previous encounter notes.  Time spent on visit including pre-visit chart review and post-visit care was 22 minutes.   Migdalia Dk, am acting as Location manager for [employer], DO   I have reviewed the above documentation for accuracy and completeness, and I agree with the above. Jearld Lesch, DO

## 2019-02-10 NOTE — Telephone Encounter (Signed)
Patient left a message asking for a refill on Gabapentin.  I reviewed her chart.  The history of Gabapentin dates back, not current. Recent clinic note suggests that Dr. Letta Pate wanted to replace gabapentin with duloxetine.  I contacted the patient and she states duloxetine is not helping and that is why she wishes to return to gabapentin.

## 2019-02-10 NOTE — Telephone Encounter (Signed)
Sent to [place] on [address].  Gabapentin 600 mg 3 times daily may discontinue duloxetine.  Please call patient

## 2019-02-11 NOTE — Telephone Encounter (Signed)
Patient notified

## 2019-02-15 DIAGNOSIS — Z794 Long term (current) use of insulin: Secondary | ICD-10-CM | POA: Diagnosis not present

## 2019-02-15 DIAGNOSIS — I10 Essential (primary) hypertension: Secondary | ICD-10-CM | POA: Diagnosis not present

## 2019-02-15 DIAGNOSIS — E118 Type 2 diabetes mellitus with unspecified complications: Secondary | ICD-10-CM | POA: Diagnosis not present

## 2019-02-15 DIAGNOSIS — E1122 Type 2 diabetes mellitus with diabetic chronic kidney disease: Secondary | ICD-10-CM | POA: Diagnosis not present

## 2019-02-15 DIAGNOSIS — G629 Polyneuropathy, unspecified: Secondary | ICD-10-CM | POA: Diagnosis not present

## 2019-02-15 DIAGNOSIS — I6930 Unspecified sequelae of cerebral infarction: Secondary | ICD-10-CM | POA: Diagnosis not present

## 2019-02-15 DIAGNOSIS — M79676 Pain in unspecified toe(s): Secondary | ICD-10-CM | POA: Diagnosis not present

## 2019-02-16 DIAGNOSIS — E1122 Type 2 diabetes mellitus with diabetic chronic kidney disease: Secondary | ICD-10-CM | POA: Diagnosis not present

## 2019-02-16 DIAGNOSIS — I10 Essential (primary) hypertension: Secondary | ICD-10-CM | POA: Diagnosis not present

## 2019-02-16 DIAGNOSIS — I6930 Unspecified sequelae of cerebral infarction: Secondary | ICD-10-CM | POA: Diagnosis not present

## 2019-02-16 LAB — LIPID PANEL
Cholesterol: 263 — AB (ref 0–200)
HDL: 49 (ref 35–70)
LDL Cholesterol: 177
LDl/HDL Ratio: 3.6
Triglycerides: 199 — AB (ref 40–160)

## 2019-02-16 LAB — HEPATIC FUNCTION PANEL
ALT: 23 (ref 7–35)
AST: 14 (ref 13–35)
Alkaline Phosphatase: 71 (ref 25–125)
Bilirubin, Total: 0.3

## 2019-02-16 LAB — BASIC METABOLIC PANEL
BUN: 13 (ref 4–21)
CO2: 27 — AB (ref 13–22)
Chloride: 104 (ref 99–108)
Creatinine: 1 (ref 0.5–1.1)
Glucose: 227
Potassium: 4.9 (ref 3.4–5.3)
Sodium: 139 (ref 137–147)

## 2019-02-16 LAB — CBC AND DIFFERENTIAL
HCT: 37 (ref 36–46)
Hemoglobin: 11.7 — AB (ref 12.0–16.0)
Platelets: 399 (ref 150–399)
WBC: 7.4

## 2019-02-16 LAB — COMPREHENSIVE METABOLIC PANEL
Albumin: 2.8 — AB (ref 3.5–5.0)
Calcium: 8.8 (ref 8.7–10.7)
GFR calc Af Amer: 75.4
GFR calc non Af Amer: 62.32
Globulin: 3.9

## 2019-02-16 LAB — HEMOGLOBIN A1C: Hemoglobin A1C: 8.8

## 2019-02-16 LAB — CBC: RBC: 4.53 (ref 3.87–5.11)

## 2019-02-18 ENCOUNTER — Other Ambulatory Visit: Payer: Self-pay

## 2019-02-18 NOTE — Patient Outreach (Signed)
  [facility]) Care Management Chronic Special Needs Program    02/18/2019  Name: JULIAN MULLET, DOB: 02/25/1970  MRN: [id number]   Ms. Eria Wind is enrolled in a chronic special needs plan for Diabetes. Called client to follow up on call to Nurse Advise Line last night States that she was started on a new B/P medication Hydralazine 10mg  x 1 dose and her lips started to swell and her chest was itching.  States that she took a total of 100 mg of Benadryl and cetrizine which has helped.  States her lips are still swollen some.  Denies any SOB.  States her husband watched her all night long to make sure she did not get worse.  States she has an Epi-pen if needed.  States she has a call out to her doctor to see what to do next Advised to not take  Hydralazine 10 mg and to review with her doctor if this drug is causing her allergic reactions Reinforced to use her Epi-pen and call 911 if symptoms worsen Encouraged to drink more fluids Plan to forward note to Dr. Maryan Char Plan to outreach as scheduled per tier level or sooner as needed.  Peter Garter RN, [facility] [place], CDE Chronic Care Management Coordinator [place] [facility] [phone]

## 2019-02-22 ENCOUNTER — Encounter: Payer: Self-pay | Admitting: Sports Medicine

## 2019-02-22 ENCOUNTER — Other Ambulatory Visit: Payer: Self-pay

## 2019-02-22 ENCOUNTER — Ambulatory Visit (INDEPENDENT_AMBULATORY_CARE_PROVIDER_SITE_OTHER): Payer: HMO | Admitting: Sports Medicine

## 2019-02-22 VITALS — BP 120/75 | HR 84 | Temp 96.7°F

## 2019-02-22 DIAGNOSIS — M79674 Pain in right toe(s): Secondary | ICD-10-CM | POA: Diagnosis not present

## 2019-02-22 DIAGNOSIS — S90111A Contusion of right great toe without damage to nail, initial encounter: Secondary | ICD-10-CM | POA: Diagnosis not present

## 2019-02-22 DIAGNOSIS — M79675 Pain in left toe(s): Secondary | ICD-10-CM

## 2019-02-22 DIAGNOSIS — I10 Essential (primary) hypertension: Secondary | ICD-10-CM | POA: Diagnosis not present

## 2019-02-22 DIAGNOSIS — E1142 Type 2 diabetes mellitus with diabetic polyneuropathy: Secondary | ICD-10-CM | POA: Diagnosis not present

## 2019-02-22 DIAGNOSIS — B351 Tinea unguium: Secondary | ICD-10-CM

## 2019-02-22 DIAGNOSIS — G629 Polyneuropathy, unspecified: Secondary | ICD-10-CM | POA: Diagnosis not present

## 2019-02-22 DIAGNOSIS — G4700 Insomnia, unspecified: Secondary | ICD-10-CM | POA: Diagnosis not present

## 2019-02-22 DIAGNOSIS — E785 Hyperlipidemia, unspecified: Secondary | ICD-10-CM | POA: Diagnosis not present

## 2019-02-22 NOTE — Progress Notes (Signed)
Subjective: Tina [name] is a 49 y.o. female patient with history of diabetes who presents to office today complaining of long,mildly painful nails  while ambulating in shoes; unable to trim, reports pain at right 1st toenail that started after trimming nail and nicking skin and reports that she treated it with antibiotic cream and it healed but there is still pain to the nail. Patient states that the glucose reading this morning was 110mg /dl. Patient denies any new changes in medication or new problems.   Patient Active Problem List   Diagnosis Date Noted  . Depression 04/13/2018  . History of vertigo 03/22/2018  . Laboratory examination 03/22/2018  . Chronic diastolic (congestive) heart failure ([place]) 03/22/2018  . Other hyperlipidemia 02/15/2018  . Vitamin D deficiency 12/31/2017  . Class 2 severe obesity with serious comorbidity and body mass index (BMI) of 39.0 to 39.9 in adult ([place]) 11/18/2017  . Gait disturbance, post-stroke 04/23/2017  . Strain of gluteus medius 04/23/2017  . OSA (obstructive sleep apnea) 10/17/2015  . Periodic limb movement disorder (PLMD) 10/17/2015  . Essential hypertension 05/26/2015  . Diabetes mellitus ([place]) 05/26/2015  . Abdominal wall mass of right lower quadrant 05/25/2015  . Hemiparesis affecting right side as late effect of stroke ([place]) 11/09/2014  . Abdominal wall seroma   . Sepsis due to undetermined organism ([place]) 03/06/2014  . Nausea vomiting and diarrhea 03/06/2014  . Sepsis ([place]) 03/06/2014  . Tension headache 09/01/2013  . Nausea with vomiting ?Gastroparesis? 03/21/2013  . Spastic hemiplegia affecting dominant side ([place]) 02/16/2013  . Recurrent ventral incisional hernia s/p closure/repair w mesh 02/04/2013 01/26/2013  . Constipation, chronic 01/26/2013  . Abdominal pain, chronic, right lower quadrant 06/03/2012  . History of CVA (cerebrovascular accident) 06/03/2012  . HTN (hypertension) 06/03/2012  . Diabetes mellitus type II,  uncontrolled ([place]) 10/16/2011  . Obesity (BMI 30-39.9) 09/02/2011  . Rotator cuff tear 08/25/2011  . Sciatica 06/10/2011  . Paresthesias in right hand 06/10/2011  . Lumbago 05/09/2011  . Paresthesias 05/09/2011   Current Outpatient Medications on File Prior to Visit  Medication Sig Dispense Refill  . allopurinol (ZYLOPRIM) 100 MG tablet Take 100 mg by mouth every evening.    Marland Kitchen aspirin 325 MG tablet Take 325 mg by mouth at bedtime.     Marland Kitchen atorvastatin (LIPITOR) 10 MG tablet Take 1 tablet (10 mg total) by mouth daily. 30 tablet 0  . buPROPion (WELLBUTRIN SR) 150 MG 12 hr tablet Take 1 tablet (150 mg total) by mouth daily. 30 tablet 0  . cetirizine (ZYRTEC) 10 MG tablet Take 10 mg by mouth daily as needed for allergies.   11  . cloNIDine (CATAPRES) 0.1 MG tablet Take 0.1 mg by mouth 2 (two) times daily.  0  . DULoxetine (CYMBALTA) 20 MG capsule Take 20 mg by mouth daily. 20 mg am, 40 mg pm    . EPINEPHrine 0.3 mg/0.3 mL IJ SOAJ injection INJECT AS DIRECTED AS NEEDED FOR ALLERGIC REACTION 1 Device 2  . fluticasone (FLONASE) 50 MCG/ACT nasal spray daily as needed.     . furosemide (LASIX) 40 MG tablet Take 40 mg by mouth.    . gabapentin (NEURONTIN) 300 MG capsule Take 2 capsules (600 mg total) by mouth 3 (three) times daily. 180 capsule 2  . insulin aspart (NOVOLOG) 100 UNIT/ML injection Inject 5-15 Units into the skin 3 (three) times daily with meals. Per sliding scale--pt uses Omnipod and gets a basal rate     . omeprazole (PRILOSEC) 40 MG  capsule Take 1 capsule by mouth once daily 90 capsule 0  . ondansetron (ZOFRAN-ODT) 4 MG disintegrating tablet Take 1 tablet (4 mg total) by mouth every 8 (eight) hours as needed for nausea or vomiting. Dissolve under tongue 90 tablet 1  . Semaglutide (OZEMPIC, 0.25 OR 0.5 MG/DOSE, [place]) Inject 0.5 mg into the skin.    Marland Kitchen tiZANidine (ZANAFLEX) 2 MG tablet Take 1 tablet (2 mg total) by mouth at bedtime. TAKE 1 TABLET AT BEDTIME DAILY. 90 tablet 1  . traMADol  (ULTRAM) 50 MG tablet Take 1 tablet (50 mg total) by mouth 2 (two) times daily. 60 tablet 5  . Vitamin D, Ergocalciferol, (DRISDOL) 1.25 MG (50000 UNIT) CAPS capsule Take 1 capsule (50,000 Units total) by mouth every 7 (seven) days. 4 capsule 0  . colchicine 0.6 MG tablet Take 0.6 mg by mouth daily as needed (For gout flare-up.).   1   No current facility-administered medications on file prior to visit.   Allergies  Allergen Reactions  . Contrast Media [Iodinated Diagnostic Agents] Other (See Comments)    Difficulty breathing  . Iohexol Hives, Nausea And Vomiting and Swelling     Desc: Magnevist-gadolinium-difficulty breathing, throat swelling   . Midazolam Hcl Anaphylaxis    Difficulty breathing  . Shellfish Allergy Anaphylaxis  . Valsartan Swelling  . Metformin And Related Diarrhea and Nausea And Vomiting  . Other Itching    Patient is allergic to all nuts except peanuts.   Marland Kitchen Spironolactone Other (See Comments)    Elevated potassium   . Avandia [Rosiglitazone Maleate] Hives and Other (See Comments)  . Geodon [Ziprasidone] Other (See Comments)    UNKNOWN  . Kiwi Extract Itching and Swelling  . Latex Itching    Recent Results (from the past 2160 hour(s))  CBC and differential     Status: Abnormal   Collection Time: 12/08/18 12:00 AM  Result Value Ref Range   Hemoglobin 11.3 (A) 12.0 - 16.0   HCT 35 (A) 36 - 46   WBC 6.3   CBC     Status: None   Collection Time: 12/08/18 12:00 AM  Result Value Ref Range   RBC 4.4 3.87 - 5.11  VITAMIN D 25 Hydroxy (Vit-D Deficiency, Fractures)     Status: None   Collection Time: 12/08/18 12:00 AM  Result Value Ref Range   Vit D, 25-Hydroxy 20   Basic metabolic panel     Status: Abnormal   Collection Time: 12/08/18 12:00 AM  Result Value Ref Range   CO2 24 (A) 13 - 22   Creatinine 1.3 (A) 0.5 - 1.1   Potassium 7.1 (A) 3.4 - 5.3   Sodium 138 137 - 147   Chloride 105 99 - 108  Lipid panel     Status: Abnormal   Collection Time:  12/08/18 12:00 AM  Result Value Ref Range   Triglycerides 323 (A) 40 - 160   Cholesterol 204 (A) 0 - 200   HDL 48 35 - 70   LDL Cholesterol 91   Hepatic function panel     Status: None   Collection Time: 12/08/18 12:00 AM  Result Value Ref Range   ALT 18 7 - 35   AST 14 13 - 35   Bilirubin, Total 0.2   Hemoglobin A1c     Status: None   Collection Time: 12/08/18 12:00 AM  Result Value Ref Range   Hemoglobin A1C 8.4     Objective: General: Patient is awake, alert, and oriented  x 3 and in no acute distress.  Integument: Skin is warm, dry and supple bilateral. Nails are tender, long, thickened and dystrophic with subungual debris, consistent with onychomycosis, 1-5 bilateral. There is a small contusion to right hallux nail medial apsect. No signs of infection. No open lesions or preulcerative lesions present bilateral. Remaining integument unremarkable.  Vasculature:  Dorsalis Pedis pulse 1/4 bilateral. Posterior Tibial pulse  1/4 bilateral.  Capillary fill time <3 sec 1-5 bilateral. Positive hair growth to the level of the digits. Temperature gradient within normal limits. No varicosities present bilateral. No edema present bilateral.   Neurology: The patient has diminshed sensation measured with a 5.07/10g Semmes Weinstein Monofilament at all pedal sites bilateral . Vibratory sensation diminished bilateral with tuning fork. No Babinski sign present bilateral.   Musculoskeletal: No symptomatic pedal deformities noted bilateral. Muscular strength 5/5 in all lower extremity muscular groups bilateral without pain on range of motion . No tenderness with calf compression bilateral.  Assessment and Plan: Problem List Items Addressed This Visit    None    Visit Diagnoses    Pain around toenail, right foot    -  Primary   Pain due to onychomycosis of toenails of both feet       Diabetic polyneuropathy associated with type 2 diabetes mellitus (HCC)       Contusion of right great toe  without damage to nail, initial encounter           -Examined patient. -Discussed and educated patient on diabetic foot care, especially with  regards to the vascular, neurological and musculoskeletal systems.  -Stressed the importance of good glycemic control and the detriment of not  controlling glucose levels in relation to the foot. -Mechanically debrided all nails 1-5 bilateral using sterile nail nipper and filed with dremel without incident  -Dispensed toe cap for patient to use on right 1st toe -Advised patient that pain/contusion to nail will slowly grow out  -Continue with neuropathy medications -Answered all patient questions -Patient to return  in 3 months for at risk foot care -Patient advised to call the office if any problems or questions arise in the meantime.  Landis Martins, DPM

## 2019-02-24 ENCOUNTER — Encounter (INDEPENDENT_AMBULATORY_CARE_PROVIDER_SITE_OTHER): Payer: Self-pay | Admitting: Bariatrics

## 2019-02-24 ENCOUNTER — Other Ambulatory Visit: Payer: Self-pay

## 2019-02-24 ENCOUNTER — Ambulatory Visit (INDEPENDENT_AMBULATORY_CARE_PROVIDER_SITE_OTHER): Payer: Federal, State, Local not specified - PPO | Admitting: Bariatrics

## 2019-02-24 VITALS — BP 142/84 | HR 89 | Temp 98.2°F | Ht 66.0 in | Wt 234.0 lb

## 2019-02-24 DIAGNOSIS — E782 Mixed hyperlipidemia: Secondary | ICD-10-CM | POA: Diagnosis not present

## 2019-02-24 DIAGNOSIS — Z6837 Body mass index (BMI) 37.0-37.9, adult: Secondary | ICD-10-CM | POA: Diagnosis not present

## 2019-02-24 DIAGNOSIS — E66812 Obesity, class 2: Secondary | ICD-10-CM

## 2019-02-24 DIAGNOSIS — I10 Essential (primary) hypertension: Secondary | ICD-10-CM

## 2019-02-24 NOTE — Progress Notes (Signed)
Chief Complaint:   OBESITY Tina [name] is here to discuss her progress with her obesity treatment plan along with follow-up of her obesity related diagnoses. Tina [name] is on the Category 3 Plan and states she is following her eating plan approximately 75% of the time. [name] states she is exercising 0 minutes 0 times per week.  Today's visit was #: 30 Starting weight: 238 lbs Starting date: 11/03/2017 Today's weight: 234 lbs Today's date: 02/24/2019 Total lbs lost to date: 4 Total lbs lost since last in-office visit: 4  Interim History: Tina [name] is down 4 lbs. She is drinking more water and is not skipping meals.  Subjective:   Essential hypertension. Tina [name]'s PCP changed her blood pressure medication. Blood pressure is controlled today (was elevated at her last visit).  BP Readings from Last 3 Encounters:  02/24/19 (!) 142/84  02/22/19 120/75  02/10/19 (!) 173/100   Lab Results  Component Value Date   CREATININE 1.3 (A) 12/08/2018   CREATININE 1.11 (H) 04/20/2018   CREATININE 0.70 11/03/2017   Elevated triglycerides with high cholesterol. Lipids are otherwise controlled.  Lab Results  Component Value Date   ALT 18 12/08/2018   AST 14 12/08/2018   ALKPHOS 60 04/20/2018   BILITOT 0.8 04/20/2018   Lab Results  Component Value Date   CHOL 204 (A) 12/08/2018   HDL 48 12/08/2018   LDLCALC 91 12/08/2018   TRIG 323 (A) 12/08/2018   CHOLHDL 6.0 10/17/2011   Assessment/Plan:   Essential hypertension. Tina [name] is working on healthy weight loss and exercise to improve blood pressure control. We will watch for signs of hypotension as she continues her lifestyle modifications. She will continue her medications as prescribed.  Elevated triglycerides with high cholesterol. Tina [name] will decrease carbohydrates and increase healthy fats and protein.  Class 2 severe obesity with serious comorbidity and body mass index (BMI) of 37.0 to 37.9 in adult, unspecified obesity  type ([place]).  Tina [name] is currently in the action stage of change. As such, her goal is to continue with weight loss efforts. She has agreed to the Category 3 Plan.   She will work on meal planning, intentional eating, and will not skip meals.  Exercise goals: Tina [name] will walk at night.  Behavioral modification strategies: increasing lean protein intake, decreasing simple carbohydrates, increasing vegetables, increasing water intake, decreasing eating out, no skipping meals, meal planning and cooking strategies, keeping healthy foods in the home, better snacking choices, emotional eating strategies and planning for success.  Tina [name] has agreed to follow-up with our clinic in 2 weeks. She was informed of the importance of frequent follow-up visits to maximize her success with intensive lifestyle modifications for her multiple health conditions.   Objective:   Blood pressure (!) 142/84, pulse 89, temperature 98.2 F (36.8 C), height 5\' 6"  (1.676 m), weight 234 lb (106.1 kg), SpO2 100 %. Body mass index is 37.77 kg/m.  General: Cooperative, alert, well developed, in no acute distress. HEENT: Conjunctivae and lids unremarkable. Cardiovascular: Regular rhythm.  Lungs: Normal work of breathing. Neurologic: No focal deficits.   Lab Results  Component Value Date   CREATININE 1.3 (A) 12/08/2018   BUN 22 (H) 04/20/2018   NA 138 12/08/2018   K 7.1 (A) 12/08/2018   CL 105 12/08/2018   CO2 24 (A) 12/08/2018   Lab Results  Component Value Date   ALT 18 12/08/2018   AST 14 12/08/2018   ALKPHOS 60 04/20/2018   BILITOT 0.8 04/20/2018   Lab  Results  Component Value Date   HGBA1C 8.4 12/08/2018   HGBA1C 8.5 (H) 10/20/2018   HGBA1C 8.5 (H) 02/15/2018   HGBA1C 8.4 (H) 11/03/2017   HGBA1C 7.5 (H) 05/18/2015   No results found for: INSULIN Lab Results  Component Value Date   TSH 0.947 11/03/2017   Lab Results  Component Value Date   CHOL 204 (A) 12/08/2018   HDL 48 12/08/2018    LDLCALC 91 12/08/2018   TRIG 323 (A) 12/08/2018   CHOLHDL 6.0 10/17/2011   Lab Results  Component Value Date   WBC 6.3 12/08/2018   HGB 11.3 (A) 12/08/2018   HCT 35 (A) 12/08/2018   MCV 80.6 04/20/2018   PLT 348 04/20/2018   Lab Results  Component Value Date   IRON 114 11/01/2015   TIBC 372 11/01/2015   FERRITIN 35 11/01/2015   Attestation Statements:   Reviewed by clinician on day of visit: allergies, medications, problem list, medical history, surgical history, family history, social history, and previous encounter notes.  Time spent on visit including pre-visit chart review and post-visit care was 20 minutes.   Migdalia Dk, am acting as Location manager for [employer], DO   I have reviewed the above documentation for accuracy and completeness, and I agree with the above. Jearld Lesch, DO

## 2019-02-28 ENCOUNTER — Encounter (INDEPENDENT_AMBULATORY_CARE_PROVIDER_SITE_OTHER): Payer: Self-pay | Admitting: Bariatrics

## 2019-03-01 DIAGNOSIS — E113552 Type 2 diabetes mellitus with stable proliferative diabetic retinopathy, left eye: Secondary | ICD-10-CM | POA: Diagnosis not present

## 2019-03-01 DIAGNOSIS — H3561 Retinal hemorrhage, right eye: Secondary | ICD-10-CM | POA: Diagnosis not present

## 2019-03-01 DIAGNOSIS — Z961 Presence of intraocular lens: Secondary | ICD-10-CM | POA: Diagnosis not present

## 2019-03-01 DIAGNOSIS — E113511 Type 2 diabetes mellitus with proliferative diabetic retinopathy with macular edema, right eye: Secondary | ICD-10-CM | POA: Diagnosis not present

## 2019-03-02 DIAGNOSIS — E7800 Pure hypercholesterolemia, unspecified: Secondary | ICD-10-CM | POA: Diagnosis not present

## 2019-03-02 DIAGNOSIS — I10 Essential (primary) hypertension: Secondary | ICD-10-CM | POA: Diagnosis not present

## 2019-03-02 DIAGNOSIS — Z6838 Body mass index (BMI) 38.0-38.9, adult: Secondary | ICD-10-CM | POA: Diagnosis not present

## 2019-03-02 DIAGNOSIS — E1140 Type 2 diabetes mellitus with diabetic neuropathy, unspecified: Secondary | ICD-10-CM | POA: Diagnosis not present

## 2019-03-02 DIAGNOSIS — E559 Vitamin D deficiency, unspecified: Secondary | ICD-10-CM | POA: Diagnosis not present

## 2019-03-02 DIAGNOSIS — E1142 Type 2 diabetes mellitus with diabetic polyneuropathy: Secondary | ICD-10-CM | POA: Diagnosis not present

## 2019-03-02 DIAGNOSIS — E1121 Type 2 diabetes mellitus with diabetic nephropathy: Secondary | ICD-10-CM | POA: Diagnosis not present

## 2019-03-02 DIAGNOSIS — I639 Cerebral infarction, unspecified: Secondary | ICD-10-CM | POA: Diagnosis not present

## 2019-03-02 LAB — MICROALBUMIN, URINE: Microalb, Ur: 984.6

## 2019-03-10 ENCOUNTER — Encounter (INDEPENDENT_AMBULATORY_CARE_PROVIDER_SITE_OTHER): Payer: Self-pay | Admitting: Bariatrics

## 2019-03-10 ENCOUNTER — Other Ambulatory Visit: Payer: Self-pay

## 2019-03-10 ENCOUNTER — Ambulatory Visit (INDEPENDENT_AMBULATORY_CARE_PROVIDER_SITE_OTHER): Payer: Federal, State, Local not specified - PPO | Admitting: Bariatrics

## 2019-03-10 VITALS — BP 158/89 | HR 93 | Temp 98.9°F | Ht 66.0 in | Wt 235.0 lb

## 2019-03-10 DIAGNOSIS — E559 Vitamin D deficiency, unspecified: Secondary | ICD-10-CM

## 2019-03-10 DIAGNOSIS — Z9189 Other specified personal risk factors, not elsewhere classified: Secondary | ICD-10-CM

## 2019-03-10 DIAGNOSIS — Z6838 Body mass index (BMI) 38.0-38.9, adult: Secondary | ICD-10-CM | POA: Diagnosis not present

## 2019-03-10 DIAGNOSIS — I10 Essential (primary) hypertension: Secondary | ICD-10-CM | POA: Diagnosis not present

## 2019-03-10 DIAGNOSIS — F3289 Other specified depressive episodes: Secondary | ICD-10-CM | POA: Diagnosis not present

## 2019-03-10 MED ORDER — BUPROPION HCL ER (SR) 200 MG PO TB12: 200.0000 mg | ORAL_TABLET | Freq: Every day | ORAL | 0 refills | Status: DC

## 2019-03-10 MED ORDER — VITAMIN D (ERGOCALCIFEROL) 1.25 MG (50000 UNIT) PO CAPS: 50000.0000 [IU] | ORAL_CAPSULE | ORAL | 0 refills | Status: DC

## 2019-03-10 NOTE — Progress Notes (Signed)
Chief Complaint:   OBESITY Tina [name] is here to discuss her progress with her obesity treatment plan along with follow-up of her obesity related diagnoses. Tina [name] is on the Category 3 Plan and states she is following her eating plan approximately 70% of the time. Tina [name] states she is doing yoga stretches 15-20 minutes 7 times per week and exercising on the treadmill 15-20 minutes 3 times per week.  Today's visit was #: 83 Starting weight: 238 lbs Starting date: 11/03/2017 Today's weight: 235 lbs Today's date: 03/10/2019 Total lbs lost to date: 3 Total lbs lost since last in-office visit: 0  Interim History: Anya is up 1 lb.  Subjective:   Essential hypertension. Tina [name] is working with her PCP on hypertension. With the last new medication she had facial swelling. Blood pressure is not controlled at 158/89.  BP Readings from Last 3 Encounters:  03/10/19 (!) 158/89  02/24/19 (!) 142/84  02/22/19 120/75   Lab Results  Component Value Date   CREATININE 1.3 (A) 12/08/2018   CREATININE 1.11 (H) 04/20/2018   CREATININE 0.70 11/03/2017   Other depression, with emotional eating. Tina [name] is struggling with emotional eating and using food for comfort to the extent that it is negatively impacting her health. She has been working on behavior modification techniques to help reduce her emotional eating and has been somewhat successful. She shows no sign of suicidal or homicidal ideations.   Vitamin D deficiency. Tina [name] is taking Vitamin D. Last Vitamin D 20 on 12/08/2018.  At risk for osteoporosis. Tina [name] is at higher risk of osteopenia and osteoporosis due to Vitamin D deficiency.   Assessment/Plan:   Essential hypertension. Tina [name] is working on healthy weight loss and exercise to improve blood pressure control. We will watch for signs of hypotension as she continues her lifestyle modifications. She will call her PCP regarding her new medication and follow their  recommendations. She will check her blood pressure daily at home.  Other depression, with emotional eating. Behavior modification techniques were discussed today to help Tina [name] deal with her emotional/non-hunger eating behaviors.  Orders and follow up as documented in patient record. Tina [name] was given a prescription for buPROPion (WELLBUTRIN SR) 200 MG 12 hr tablet 1 PO daily #30 with 0 refills.  Vitamin D deficiency.  Low Vitamin D level contributes to fatigue and are associated with obesity, breast, and colon cancer. She was given a prescription for Vitamin D, Ergocalciferol, (DRISDOL) 1.25 MG (50000 UNIT) CAPS capsule every week #4 with 0 refills and will follow-up for routine testing of Vitamin D, at least 2-3 times per year to avoid over-replacement.    At risk for osteoporosis. Tina [name] was given approximately 15 minutes of osteoporosis prevention counseling today. Tina [name] is at risk for osteopenia and osteoporosis due to her Vitamin D deficiency. She was encouraged to take her Vitamin D and follow her higher calcium diet and increase strengthening exercise to help strengthen her bones and decrease her risk of osteopenia and osteoporosis.  Repetitive spaced learning was employed today to elicit superior memory formation and behavioral change.  Class 2 severe obesity with serious comorbidity and body mass index (BMI) of 38.0 to 38.9 in adult, unspecified obesity type (Tina [name]).  Tina [name] is currently in the action stage of change. As such, her goal is to continue with weight loss efforts. She has agreed to the Category 3 Plan.   She will work on meal planning. We discussed snacking options.  Exercise goals: Tina [name] is doing yoga almost  every day and walking on the treadmill.   Behavioral modification strategies: ways to avoid night time snacking, better snacking choices and emotional eating strategies.  Tina [name] has agreed to follow-up with our clinic in 2 weeks. She was informed of the importance  of frequent follow-up visits to maximize her success with intensive lifestyle modifications for her multiple health conditions.   Objective:   Blood pressure (!) 158/89, pulse 93, temperature 98.9 F (37.2 C), temperature source Oral, height 5\' 6"  (1.676 m), weight 235 lb (106.6 kg), last menstrual period 02/18/2019, SpO2 100 %. Body mass index is 37.93 kg/m.  General: Cooperative, alert, well developed, in no acute distress. HEENT: Conjunctivae and lids unremarkable. Cardiovascular: Regular rhythm.  Lungs: Normal work of breathing. Neurologic: No focal deficits.   Lab Results  Component Value Date   CREATININE 1.3 (A) 12/08/2018   BUN 22 (H) 04/20/2018   NA 138 12/08/2018   K 7.1 (A) 12/08/2018   CL 105 12/08/2018   CO2 24 (A) 12/08/2018   Lab Results  Component Value Date   ALT 18 12/08/2018   AST 14 12/08/2018   ALKPHOS 60 04/20/2018   BILITOT 0.8 04/20/2018   Lab Results  Component Value Date   HGBA1C 8.4 12/08/2018   HGBA1C 8.5 (H) 10/20/2018   HGBA1C 8.5 (H) 02/15/2018   HGBA1C 8.4 (H) 11/03/2017   HGBA1C 7.5 (H) 05/18/2015   No results found for: INSULIN Lab Results  Component Value Date   TSH 0.947 11/03/2017   Lab Results  Component Value Date   CHOL 204 (A) 12/08/2018   HDL 48 12/08/2018   LDLCALC 91 12/08/2018   TRIG 323 (A) 12/08/2018   CHOLHDL 6.0 10/17/2011   Lab Results  Component Value Date   WBC 6.3 12/08/2018   HGB 11.3 (A) 12/08/2018   HCT 35 (A) 12/08/2018   MCV 80.6 04/20/2018   PLT 348 04/20/2018   Lab Results  Component Value Date   IRON 114 11/01/2015   TIBC 372 11/01/2015   FERRITIN 35 11/01/2015   Attestation Statements:   Reviewed by clinician on day of visit: allergies, medications, problem list, medical history, surgical history, family history, social history, and previous encounter notes.  Migdalia Dk, am acting as Location manager for [employer], DO   I have reviewed the above documentation for accuracy and  completeness, and I agree with the above. Jearld Lesch, DO

## 2019-03-21 ENCOUNTER — Other Ambulatory Visit: Payer: Self-pay | Admitting: Cardiology

## 2019-03-21 DIAGNOSIS — I10 Essential (primary) hypertension: Secondary | ICD-10-CM

## 2019-03-29 ENCOUNTER — Other Ambulatory Visit: Payer: Self-pay

## 2019-03-29 ENCOUNTER — Ambulatory Visit (INDEPENDENT_AMBULATORY_CARE_PROVIDER_SITE_OTHER): Payer: Federal, State, Local not specified - PPO | Admitting: Bariatrics

## 2019-03-29 ENCOUNTER — Encounter (INDEPENDENT_AMBULATORY_CARE_PROVIDER_SITE_OTHER): Payer: Self-pay | Admitting: Bariatrics

## 2019-03-29 VITALS — BP 147/90 | HR 95 | Temp 98.6°F | Ht 66.0 in | Wt 234.0 lb

## 2019-03-29 DIAGNOSIS — I10 Essential (primary) hypertension: Secondary | ICD-10-CM

## 2019-03-29 DIAGNOSIS — F3289 Other specified depressive episodes: Secondary | ICD-10-CM | POA: Diagnosis not present

## 2019-03-29 DIAGNOSIS — E559 Vitamin D deficiency, unspecified: Secondary | ICD-10-CM | POA: Diagnosis not present

## 2019-03-29 DIAGNOSIS — R6889 Other general symptoms and signs: Secondary | ICD-10-CM | POA: Diagnosis not present

## 2019-03-29 DIAGNOSIS — Z6837 Body mass index (BMI) 37.0-37.9, adult: Secondary | ICD-10-CM | POA: Diagnosis not present

## 2019-03-29 MED ORDER — BUPROPION HCL ER (SR) 200 MG PO TB12: 200.0000 mg | ORAL_TABLET | Freq: Every day | ORAL | 0 refills | Status: DC

## 2019-03-29 MED ORDER — VITAMIN D (ERGOCALCIFEROL) 1.25 MG (50000 UNIT) PO CAPS: 50000.0000 [IU] | ORAL_CAPSULE | ORAL | 0 refills | Status: DC

## 2019-03-29 NOTE — Progress Notes (Signed)
Chief Complaint:   OBESITY Tina [name] is here to discuss her progress with her obesity treatment plan along with follow-up of her obesity related diagnoses. Tina [name] is on the Category 3 Plan and states she is following her eating plan approximately 40% of the time. Tina [name] states she is doing yoga 15 minutes 7 times per week and doing aerobics 15 minutes 3 times per week.  Today's visit was #: 5 Starting weight: 238 lbs Starting date: 11/03/2017 Today's weight: 234 lbs Today's date: 03/29/2019 Total lbs lost to date: 4 Total lbs lost since last in-office visit: 1  Interim History: Tina [name] is down 1 lb from her previous visit. She has gone back to work. She reports doing well with breakfast and lunch.  Subjective:   Vitamin D deficiency. Tina [name] is taking Vitamin D. Last Vitamin D 20 on 12/08/2018.  Other depression, with emotional eating. Tina [name] is struggling with emotional eating and using food for comfort to the extent that it is negatively impacting her health. She has been working on behavior modification techniques to help reduce her emotional eating and has been somewhat successful. She shows no sign of suicidal or homicidal ideations.  Essential hypertension. Tina [name] is taking Catapres. Blood pressure has been difficult to control due to multiple adverse effects to medication.  BP Readings from Last 3 Encounters:  03/29/19 (!) 147/90  03/10/19 (!) 158/89  02/24/19 (!) 142/84   Lab Results  Component Value Date   CREATININE 1.3 (A) 12/08/2018   CREATININE 1.11 (H) 04/20/2018   CREATININE 0.70 11/03/2017   Exercise intolerance. Tina [name] is at risk of exercise intolerance due to lack of activity, but is improving now.  Assessment/Plan:   Vitamin D deficiency. Low Vitamin D level contributes to fatigue and are associated with obesity, breast, and colon cancer. She was given a prescription for Vitamin D, Ergocalciferol, (DRISDOL) 1.25 MG (50000 UNIT) CAPS  capsule every week #4 with 0 refills and will follow-up for routine testing of Vitamin D, at least 2-3 times per year to avoid over-replacement.    Other depression, with emotional eating. Behavior modification techniques were discussed today to help Tina [name] deal with her emotional/non-hunger eating behaviors.  Orders and follow up as documented in patient record. Tina [name] was given a prescription for buPROPion (WELLBUTRIN SR) 200 MG 12 hr tablet 1 PO daily #30 with 0 refills.  Essential hypertension. Tina [name] is working on healthy weight loss and exercise to improve blood pressure control. We will watch for signs of hypotension as she continues her lifestyle modifications. She will continue her medications as directed and will follow-up with her PCP.   Exercise intolerance. Tina [name] was given approximately 15 minutes of exercise intolerance counseling today. She is 49 y.o. female and has risk factors exercise intolerance including obesity. We discussed intensive lifestyle modifications today with an emphasis on specific weight loss instructions and strategies. Tina [name] will slowly increase activity as tolerated.  Repetitive spaced learning was employed today to elicit superior memory formation and behavioral change.  Class 2 severe obesity with serious comorbidity and body mass index (BMI) of 37.0 to 37.9 in adult, unspecified obesity type (Tina [name]).  Tina [name] is currently in the action stage of change. As such, her goal is to continue with weight loss efforts. She has agreed to the Category 3 Plan. She was given calorie and protein goals.  She will start journaling and will work on meal planning.  Exercise goals: Tina [name] will continue her current exercise regimen and will do an  exercise class.  Behavioral modification strategies: increasing lean protein intake, decreasing simple carbohydrates, increasing vegetables, increasing water intake, decreasing eating out, no skipping meals, meal planning and  cooking strategies and keeping healthy foods in the home.  Tina [name] has agreed to follow-up with our clinic in 2 weeks. She was informed of the importance of frequent follow-up visits to maximize her success with intensive lifestyle modifications for her multiple health conditions.   Objective:   Blood pressure (!) 147/90, pulse 95, temperature 98.6 F (37 C), height 5\' 6"  (1.676 m), weight 234 lb (106.1 kg), last menstrual period 03/20/2019, SpO2 100 %. Body mass index is 37.77 kg/m.  General: Cooperative, alert, well developed, in no acute distress. HEENT: Conjunctivae and lids unremarkable. Cardiovascular: Regular rhythm.  Lungs: Normal work of breathing. Neurologic: No focal deficits.   Lab Results  Component Value Date   CREATININE 1.3 (A) 12/08/2018   BUN 22 (H) 04/20/2018   NA 138 12/08/2018   K 7.1 (A) 12/08/2018   CL 105 12/08/2018   CO2 24 (A) 12/08/2018   Lab Results  Component Value Date   ALT 18 12/08/2018   AST 14 12/08/2018   ALKPHOS 60 04/20/2018   BILITOT 0.8 04/20/2018   Lab Results  Component Value Date   HGBA1C 8.4 12/08/2018   HGBA1C 8.5 (H) 10/20/2018   HGBA1C 8.5 (H) 02/15/2018   HGBA1C 8.4 (H) 11/03/2017   HGBA1C 7.5 (H) 05/18/2015   No results found for: INSULIN Lab Results  Component Value Date   TSH 0.947 11/03/2017   Lab Results  Component Value Date   CHOL 204 (A) 12/08/2018   HDL 48 12/08/2018   LDLCALC 91 12/08/2018   TRIG 323 (A) 12/08/2018   CHOLHDL 6.0 10/17/2011   Lab Results  Component Value Date   WBC 6.3 12/08/2018   HGB 11.3 (A) 12/08/2018   HCT 35 (A) 12/08/2018   MCV 80.6 04/20/2018   PLT 348 04/20/2018   Lab Results  Component Value Date   IRON 114 11/01/2015   TIBC 372 11/01/2015   FERRITIN 35 11/01/2015   Attestation Statements:   Reviewed by clinician on day of visit: allergies, medications, problem list, medical history, surgical history, family history, social history, and previous encounter  notes.  Migdalia Dk, am acting as Location manager for [employer], DO   I have reviewed the above documentation for accuracy and completeness, and I agree with the above. Jearld Lesch, DO

## 2019-03-31 ENCOUNTER — Ambulatory Visit: Payer: HMO | Admitting: Cardiology

## 2019-04-07 ENCOUNTER — Ambulatory Visit: Payer: Federal, State, Local not specified - PPO | Admitting: Cardiology

## 2019-04-07 ENCOUNTER — Encounter: Payer: Self-pay | Admitting: Cardiology

## 2019-04-07 ENCOUNTER — Other Ambulatory Visit: Payer: Self-pay

## 2019-04-07 VITALS — BP 151/94 | HR 88 | Temp 98.6°F | Ht 66.0 in | Wt 231.6 lb

## 2019-04-07 DIAGNOSIS — I10 Essential (primary) hypertension: Secondary | ICD-10-CM

## 2019-04-07 DIAGNOSIS — I5189 Other ill-defined heart diseases: Secondary | ICD-10-CM | POA: Diagnosis not present

## 2019-04-07 DIAGNOSIS — E1140 Type 2 diabetes mellitus with diabetic neuropathy, unspecified: Secondary | ICD-10-CM | POA: Diagnosis not present

## 2019-04-07 DIAGNOSIS — Z794 Long term (current) use of insulin: Secondary | ICD-10-CM | POA: Diagnosis not present

## 2019-04-07 DIAGNOSIS — I6930 Unspecified sequelae of cerebral infarction: Secondary | ICD-10-CM | POA: Diagnosis not present

## 2019-04-07 DIAGNOSIS — E1169 Type 2 diabetes mellitus with other specified complication: Secondary | ICD-10-CM

## 2019-04-07 DIAGNOSIS — Z6837 Body mass index (BMI) 37.0-37.9, adult: Secondary | ICD-10-CM | POA: Diagnosis not present

## 2019-04-07 NOTE — Progress Notes (Signed)
Primary Physician:  [name], DO   Patient ID: Tina [name], female    DOB: [birth year], 49 y.o.   MRN: [id number]  Subjective:   Chief Complaint  Patient presents with  . Follow-up    6 month     HPI: Tina [name]  is a 49 y.o. female  with history of CVA with residual right-sided hemiparesis which is essentially improved and does very minimal residual defect, insulin dependent diabetes mellitus, obesity, hypertension, obesity comes in to the office with a chief complaint of follow up.   Since last office visit patient has not been hospitalized or seen in urgent care for cardiovascular symptoms.  Since last office visit patient has restarted aspirin and has not experienced any bleeding.  Since last office visit she is also increased her physical activity.  She does yoga in the morning and does kickboxing at least 3 times a week.  Patient states that her blood pressure is better improved with lifestyle modification and medication changes as per her primary care physician.  She has discontinued spironolactone, valsartan, and hydralazine due to intolerances and allergic reactions.  Patient states that her LDL is currently being managed by her weight management physician.  I do not have the most recent LDL for review.  In the setting of prior stroke and insulin-dependent diabetes mellitus type 2 would recommend an LDL level less than 70 mg/dL.   Currently patient denies chest pain, shortness of breath at rest or effort related symptoms, lightheadedness, dizziness, palpitations, orthopnea, paroxysmal nocturnal dyspnea, lower extremity swelling, near syncope, syncopal events, hematochezia, hemoptysis, hematemesis, melanotic stools, no symptoms of amaurosis fugax, motor or sensory symptoms or dysphasia in the last 6 months.   Past Medical History:  Diagnosis Date  . Allergy   . Anemia    DURING MENSES--HAS HEAVY BLEEDING WITH PERODS  . Anxiety   . Back pain, chronic    "ongoing"    . Blood transfusion    IN 2012  AFTER C -SECTION  . Cerebral thrombosis with cerebral infarction [facility]) JUNE 2011   RIGHT SIDED WEAKNESS ( ARM AND LEG ) AND SPASMS-remains with slight weakness and vertigo.  . Constipation   . Depression   . Diabetes mellitus   . Diabetic neuropathy ([place])    BOTH FEET --COMES AND GOES  . Edema, lower extremity   . Endometriosis   . Fatty liver   . GERD (gastroesophageal reflux disease)    with pregnancy  . H/O eye surgery   . Headache(784.0)    MIGRAINES--NOT REALLY HEADACHE-MORE LIKE PRESSURE SENSATION IN HEAD-FEELS DIZZIY AND  FAINT AS THE PRESSURE RESOLVES  . Hernia, incisional, RLQ, s/p lap repair Sep 2013 09/02/2011  . History of vertigo 03/22/2018  . Hx of migraines 10/19/2011  . Hypertension   . Leg pain, right    "like bad Charley horse"  . Multiple food allergies   . Panic disorder without agoraphobia   . Rash    HANDS, ARMS --STATES HX OF RASH EVER SINCE CHILDBIRTH/PREGNANCY.  STATES THE RASH OFTEN OCCURS WHEN SHE IS REALLY STRESSED."goes and comes-presently left ring finger"  . Restless leg syndrome    DIAGNOSED BY SLEEP STUDY - PT TOLD SHE DID NOT HAVE SLEEP APNEA  . Right rotator cuff tear    PAIN IN RIGHT SHOULDER  . SBO (small bowel obstruction) ([place]) 06/03/2012  . Shortness of breath   . Spastic hemiplegia affecting dominant side ([place])   . Stomach problems   . Stroke (  [place])   . Ventral hernia    RIGHT LOWER QUADRANT-CAUSING SOME PAIN  . Weakness of right side of body     Past Surgical History:  Procedure Laterality Date  . APPLICATION OF WOUND VAC N/A 05/25/2015   Procedure: APPLICATION OF WOUND VAC;  Surgeon: Michael Boston, MD;  Location: [facility];  Service: General;  Laterality: N/A;  . CESAREAN SECTION  2012  . COLONOSCOPY    . DIAGNOSTIC LAPAROSCOPY    . ESOPHAGOGASTRODUODENOSCOPY N/A 06/03/2012   Procedure: ESOPHAGOGASTRODUODENOSCOPY (EGD);  Surgeon: [name] Craver, MD;  Location: [facility] ENDOSCOPY;  Service: Endoscopy;  Laterality:  N/A;  . EXCISION MASS ABDOMINAL N/A 05/25/2015   Procedure: ABDOMINAL WALL EXPLORATION EXCISION OF SEROMA REMOVAL OF REDUNDANT SKIN ;  Surgeon: Michael Boston, MD;  Location: [facility];  Service: General;  Laterality: N/A;  . EYE SURGERY     Eye laser for vessel hemorrhaging  . fybroid removal    . HERNIA REPAIR  10/03/11   ventral hernia repair  . INSERTION OF MESH N/A 02/04/2013   Procedure: INSERTION OF MESH;  Surgeon: Adin Hector, MD;  Location: [facility];  Service: General;  Laterality: N/A;  . UMBILICAL HERNIA REPAIR N/A 02/04/2013   Procedure: LAPAROSCOPIC ventral wall hernia repair LAPAROSCOPIC LYSIS OF ADHESIONS laparoscopic exploration of abdomen ;  Surgeon: Adin Hector, MD;  Location: [facility];  Service: General;  Laterality: N/A;  . UPPER GASTROINTESTINAL ENDOSCOPY    . URETER REVISION     Bilateral "twisted"  . UTERINE FIBROID SURGERY     2 SURGERIES FOR FIBROIDS  . VENTRAL HERNIA REPAIR  10/03/2011   Procedure: LAPAROSCOPIC VENTRAL HERNIA;  Surgeon: Adin Hector, MD;  Location: [facility];  Service: General;  Laterality: N/A;    Social History   Tobacco Use  . Smoking status: Never Smoker  . Smokeless tobacco: Never Used  Substance Use Topics  . Alcohol use: No    Alcohol/week: 0.0 standard drinks  . Drug use: No     Review of Systems  Constitution: Positive for weight loss. Negative for decreased appetite and malaise/fatigue.  Eyes: Negative for visual disturbance.  Cardiovascular: Negative for chest pain, claudication, dyspnea on exertion, leg swelling, orthopnea, palpitations and syncope.  Respiratory: Negative for hemoptysis and wheezing.   Endocrine: Negative for cold intolerance and heat intolerance.  Hematologic/Lymphatic: Does not bruise/bleed easily.  Skin: Negative for nail changes.  Musculoskeletal: Negative for muscle weakness and myalgias.  Gastrointestinal: Negative for abdominal pain, change in bowel habit, nausea and vomiting.  Neurological: Negative  for difficulty with concentration, dizziness, focal weakness and headaches.  Psychiatric/Behavioral: Negative for altered mental status and suicidal ideas.  All other systems reviewed and are negative.     Objective:  Blood pressure (!) 151/94, pulse 88, temperature 98.6 F (37 C), height 5\' 6"  (1.676 m), weight 231 lb 9.6 oz (105.1 kg), last menstrual period 03/20/2019, SpO2 98 %. Body mass index is 37.38 kg/m.    Physical Exam  Constitutional: She is oriented to person, place, and time. Vital signs are normal. She appears well-developed and well-nourished.  HENT:  Head: Normocephalic and atraumatic.  Cardiovascular: Normal rate, regular rhythm, S1 normal, S2 normal and intact distal pulses. Exam reveals no gallop.  No murmur heard. Pulses:      Femoral pulses are 2+ on the right side and 2+ on the left side.      Popliteal pulses are 2+ on the right side and 2+ on the left side.  Dorsalis pedis pulses are 2+ on the right side and 2+ on the left side.       Posterior tibial pulses are 2+ on the right side and 2+ on the left side.  Pulmonary/Chest: Effort normal and breath sounds normal. No accessory muscle usage. No respiratory distress. She has no wheezes. She has no rales.  Abdominal: Soft. Bowel sounds are normal.  Musculoskeletal:        General: Normal range of motion.     Cervical back: Normal range of motion.  Neurological: She is alert and oriented to person, place, and time.  Skin: Skin is warm and dry.  Vitals reviewed.  Radiology: No results found.  Laboratory examination:    CMP Latest Ref Rng & Units 12/08/2018 04/20/2018 11/03/2017  Glucose 70 - 99 mg/dL - 240(H) 128(H)  BUN 6 - 20 mg/dL - 22(H) 11  Creatinine 0.5 - 1.1 1.3(A) 1.11(H) 0.70  Sodium 137 - 147 138 137 141  Potassium 3.4 - 5.3 7.1(A) 3.6 3.7  Chloride 99 - 108 105 99 100  CO2 13 - 22 24(A) 25 24  Calcium 8.9 - 10.3 mg/dL - 10.1 9.1  Total Protein 6.5 - 8.1 g/dL - 8.0 6.8  Total Bilirubin  0.3 - 1.2 mg/dL - 0.8 0.3  Alkaline Phos 38 - 126 U/L - 60 54  AST 13 - 35 14 18 14   ALT 7 - 35 18 14 13    CBC Latest Ref Rng & Units 12/08/2018 04/20/2018 07/05/2017  WBC - 6.3 13.1(H) 6.1  Hemoglobin 12.0 - 16.0 11.3(A) 13.5 12.1  Hematocrit 36 - 46 35(A) 42.4 38.4  Platelets 150 - 400 K/uL - 348 317   Lipid Panel     Component Value Date/Time   CHOL 204 (A) 12/08/2018 0000   CHOL 169 02/15/2018 1034   TRIG 323 (A) 12/08/2018 0000   HDL 48 12/08/2018 0000   HDL 41 02/15/2018 1034   CHOLHDL 6.0 10/17/2011 0315   VLDL 59 (H) 10/17/2011 0315   LDLCALC 91 12/08/2018 0000   LDLCALC 94 02/15/2018 1034   HEMOGLOBIN A1C Lab Results  Component Value Date   HGBA1C 8.4 12/08/2018   MPG 169 05/18/2015   TSH No results for input(s): TSH in the last 8760 hours.  PRN Meds:. Medications Discontinued During This Encounter  Medication Reason  . buPROPion (WELLBUTRIN SR) 200 MG 12 hr tablet Discontinued by provider   Current Meds  Medication Sig  . allopurinol (ZYLOPRIM) 100 MG tablet Take 100 mg by mouth every evening.  Marland Kitchen aspirin 325 MG tablet Take 325 mg by mouth at bedtime.   Marland Kitchen atorvastatin (LIPITOR) 10 MG tablet Take 1 tablet (10 mg total) by mouth daily.  . cetirizine (ZYRTEC) 10 MG tablet Take 10 mg by mouth daily as needed for allergies.   . cloNIDine (CATAPRES) 0.1 MG tablet Take 0.1 mg by mouth 2 (two) times daily.  . colchicine 0.6 MG tablet Take 0.6 mg by mouth daily as needed (For gout flare-up.).   Marland Kitchen DULoxetine (CYMBALTA) 20 MG capsule Take 20 mg by mouth daily. 20 mg am, 40 mg pm  . EPINEPHrine 0.3 mg/0.3 mL IJ SOAJ injection INJECT AS DIRECTED AS NEEDED FOR ALLERGIC REACTION  . fluticasone (FLONASE) 50 MCG/ACT nasal spray daily as needed.   . furosemide (LASIX) 40 MG tablet Take 40 mg by mouth.  . gabapentin (NEURONTIN) 300 MG capsule Take 2 capsules (600 mg total) by mouth 3 (three) times daily. (Patient taking differently: Take 600  mg by mouth at bedtime as needed. )    . insulin aspart (NOVOLOG) 100 UNIT/ML injection Inject 5-15 Units into the skin 3 (three) times daily with meals. Per sliding scale--pt uses Omnipod and gets a basal rate   . omeprazole (PRILOSEC) 40 MG capsule Take 1 capsule by mouth once daily  . ondansetron (ZOFRAN-ODT) 4 MG disintegrating tablet Take 1 tablet (4 mg total) by mouth every 8 (eight) hours as needed for nausea or vomiting. Dissolve under tongue  . Semaglutide (OZEMPIC, 0.25 OR 0.5 MG/DOSE, [place]) Inject 0.5 mg into the skin.  Marland Kitchen tiZANidine (ZANAFLEX) 2 MG tablet Take 1 tablet (2 mg total) by mouth at bedtime. TAKE 1 TABLET AT BEDTIME DAILY.  . traMADol (ULTRAM) 50 MG tablet Take 1 tablet (50 mg total) by mouth 2 (two) times daily.  . Vitamin D, Ergocalciferol, (DRISDOL) 1.25 MG (50000 UNIT) CAPS capsule Take 1 capsule (50,000 Units total) by mouth every 7 (seven) days.    Cardiac Studies:   EKG: 10/01/2018: Normal sinus rhythm at 82 bpm, normal axis, no evidence of ischemia. 04/07/2019: Normal sinus rhythm ventricular rate of 87 bpm, normal axis deviation, wandering baseline in the precordial leads V1-V3 most likely respiratory artifact, without underlying ischemia or injury pattern.  Similar to prior EKG.  Echocardiogram 08/21/2017: Left ventricle cavity is normal in size. Mild concentric hypertrophy of the left ventricle. Normal global wall motion. Doppler evidence of grade II (pseudonormal) diastolic dysfunction, elevated LAP. Calculated EF 55%. Mildly restricted aortic valve leaflets with trace aortic valve stenosis. Aortic valve mean gradient of 8 mmHg, Vmax of 2.0 m/s. Calculated aortic valve area by continuity equation is 1.4 cm. No evidence of pulmonary hypertension.  Lexiscan myoview stress test 08/21/2017:  1. Lexiscan stress test was performed. Exercise capacity was not assessed. Resting BP 148/90 mmHg, peak effect BP 168/90 mmHg. Stress symptoms included dizziness, nausea, headache, chest tightness.  2. The overall  quality of the study is good. There is no evidence of abnormal lung activity. Stress and rest SPECT images demonstrate homogeneous tracer distribution throughout the myocardium. Gated SPECT imaging reveals normal myocardial thickening and wall motion. The left ventricular ejection fraction was normal calculated as 45%, although visually appears normal.  3. Low risk study.  ABI 08/21/2017: This exam reveals normal perfusion of both the lower extremity (RABI 1.27 and LABI 1.20 with biphasic waveform). ABI may be falsely elevated in patients with DM and medial calcinosis.  Assessment:     0000000   1. Diastolic dysfunction without heart failure  I51.89   2. Essential hypertension  I10 EKG 12-Lead  3. History of CVA with residual deficit  I69.30   4. Type 2 diabetes mellitus with diabetic neuropathy, with long-term current use of insulin (HCC)  E11.40    Z79.4   5. Type 2 diabetes mellitus with other specified complication, with long-term current use of insulin (HCC)  E11.69    Z79.4   6. Long-term insulin use (HCC)  Z79.4   7. Class 2 severe obesity due to excess calories with serious comorbidity and body mass index (BMI) of 37.0 to 37.9 in adult [facility])  E66.01    Z68.37    Recommendations:   Known history of diastolic dysfunction without prior heart failure exacerbation stage B, NYHA class II:  Educated on the importance of guideline directed medical therapy and improving modifiable cardiovascular risk factors.  Medications reconciled.  Recommended that she could be on aspirin 81 mg p.o. daily instead of 325 mg p.o. daily,  she would like to discuss it further with her PCP.  Patient is currently not on Aldactone, ARB, or hydralazine secondary to documented allergies and/or intolerances.  Patient has hypertension is currently managed by PCP.  I have asked her to take her blood pressure log in at her next office visit so that medications can further be titrated.  Patient states that  she currently sees a physician for weight management who monitors her cholesterol and vitamin D levels.  I do not have a more recent lipid profile to review.  But patient is informed to keep her LDL less than 70 mg/dL.  Patient educated on the importance of better glycemic control.  Most recent hemoglobin A1c 8.4.  Insulin-dependent diabetes mellitus type 2: Currently managed by her PCP.  Obesity, due to excess calories:  Body mass index is 37.38 kg/m.  Since last office visit patient has lost approximately 5 pounds.  Patient is congratulated on her efforts and the change that she has been able to accomplish with lifestyle modifications.  Patient continues to be seen at the weight loss clinic which was reemphasized and encouraged.  I reviewed with the patient the importance of diet, regular physical activity/exercise, weight loss.    Patient is educated on increasing physical activity gradually as tolerated.  With the goal of moderate intensity exercise for 30 minutes a day 5 days a week.  Orders Placed This Encounter  Procedures  . EKG 12-Lead   Evaluation and management (88min) with time spent performing exam, reviewing labs, studies, counseling and coordination care with the patient regarding complex decision making and discussion as state above, and documenting clinical evaluation.  --Continue cardiac medications as reconciled in final medication list. --Return in about 6 months (around 10/08/2019). Or sooner if needed. --Continue follow-up with your primary care physician regarding the management of your other chronic comorbid conditions.  Patient's questions and concerns were addressed to her satisfaction. She voices understanding of the instructions provided during this encounter.   This note was created using a voice recognition software as a result there may be grammatical errors inadvertently enclosed that do not reflect the nature of this encounter. Every attempt is made to  correct such errors.  Rex Kras, DO, [facility]. [place] Office: [phone]

## 2019-04-12 ENCOUNTER — Ambulatory Visit (INDEPENDENT_AMBULATORY_CARE_PROVIDER_SITE_OTHER): Payer: Federal, State, Local not specified - PPO | Admitting: Bariatrics

## 2019-04-12 ENCOUNTER — Other Ambulatory Visit: Payer: Self-pay

## 2019-04-12 ENCOUNTER — Encounter (INDEPENDENT_AMBULATORY_CARE_PROVIDER_SITE_OTHER): Payer: Self-pay | Admitting: Bariatrics

## 2019-04-12 VITALS — BP 142/77 | HR 89 | Temp 98.4°F | Ht 66.0 in | Wt 230.0 lb

## 2019-04-12 DIAGNOSIS — Z9189 Other specified personal risk factors, not elsewhere classified: Secondary | ICD-10-CM | POA: Diagnosis not present

## 2019-04-12 DIAGNOSIS — E559 Vitamin D deficiency, unspecified: Secondary | ICD-10-CM

## 2019-04-12 DIAGNOSIS — F3289 Other specified depressive episodes: Secondary | ICD-10-CM

## 2019-04-12 DIAGNOSIS — R5383 Other fatigue: Secondary | ICD-10-CM | POA: Diagnosis not present

## 2019-04-12 DIAGNOSIS — E119 Type 2 diabetes mellitus without complications: Secondary | ICD-10-CM

## 2019-04-12 DIAGNOSIS — Z794 Long term (current) use of insulin: Secondary | ICD-10-CM

## 2019-04-12 DIAGNOSIS — Z6837 Body mass index (BMI) 37.0-37.9, adult: Secondary | ICD-10-CM

## 2019-04-12 MED ORDER — VITAMIN D (ERGOCALCIFEROL) 1.25 MG (50000 UNIT) PO CAPS: 50000.0000 [IU] | ORAL_CAPSULE | ORAL | 0 refills | Status: DC

## 2019-04-12 NOTE — Progress Notes (Signed)
Chief Complaint:   OBESITY Tina [name] is here to discuss her progress with her obesity treatment plan along with follow-up of her obesity related diagnoses. Tina [name] is on the Category 3 Plan and states she is following her eating plan approximately 75% of the time. Tina [name] states she is kickboxing 50 minutes 2 times per week.  Today's visit was #: 32 Starting weight: 238 lbs Starting date: 11/03/2017 Today's weight: 230 lbs Today's date: 04/12/2019 Total lbs lost to date: 8 Total lbs lost since last in-office visit: 4  Interim History: Tina [name] is down 4 lbs. She is not getting in what she needs for breakfast.  Subjective:   Vitamin D deficiency. No nausea, vomiting, or muscle weakness. Last Vitamin D 20 on 12/08/2018.  Other depression, with emotional eating. Tina [name] is struggling with emotional eating and using food for comfort to the extent that it is negatively impacting her health. She has been working on behavior modification techniques to help reduce her emotional eating and has been somewhat successful. She shows no sign of suicidal or homicidal ideations. Tina [name] reports emotional eating. She was started on Wellbutrin at her last visit, which is helping.  Type 2 diabetes mellitus without complication, with long-term current use of insulin (Tina [name]). Tina [name] is taking NovoLog.   Lab Results  Component Value Date   HGBA1C 8.4 12/08/2018   HGBA1C 8.5 (H) 10/20/2018   HGBA1C 8.5 (H) 02/15/2018   Lab Results  Component Value Date   LDLCALC 91 12/08/2018   CREATININE 1.3 (A) 12/08/2018   No results found for: INSULIN  Other fatigue. Tina [name] is gradually increasing her exercise.  At risk for osteoporosis. Tina [name] is at higher risk of osteopenia and osteoporosis due to Vitamin D deficiency.   Assessment/Plan:   Vitamin D deficiency. Low Vitamin D level contributes to fatigue and are associated with obesity, breast, and colon cancer. She was given a prescription  for Vitamin D, Ergocalciferol, (DRISDOL) 1.25 MG (50000 UNIT) CAPS capsule every week #4 with 0 refills and VITAMIN D 25 Hydroxy (Vit-D Deficiency, Fractures) level will be checked today.   Other depression, with emotional eating. Behavior modification techniques were discussed today to help Tina [name] deal with her emotional/non-hunger eating behaviors.  Orders and follow up as documented in patient record. Tina [name] will continue Wellbutrin as directed.  Type 2 diabetes mellitus without complication, with long-term current use of insulin (Tina [name]). Good blood sugar control is important to decrease the likelihood of diabetic complications such as nephropathy, neuropathy, limb loss, blindness, coronary artery disease, and death. Intensive lifestyle modification including diet, exercise and weight loss are the first line of treatment for diabetes. Tina [name] will continue her medication as directed. Comprehensive metabolic panel, Hemoglobin A1c labs ordered today.  Other fatigue. Fatigue may be related to obesity, depression or many other causes. Labs will be ordered, and in the meanwhile, Tina [name] will focus on self care including making healthy food choices, increasing physical activity and focusing on stress reduction. Vitamin B12, T3, T4, free, TSH labs ordered today.  At risk for osteoporosis. Tina [name] was given approximately 15 minutes of osteoporosis prevention counseling today. Tina [name] is at risk for osteopenia and osteoporosis due to her Vitamin D deficiency. She was encouraged to take her Vitamin D and follow her higher calcium diet and increase strengthening exercise to help strengthen her bones and decrease her risk of osteopenia and osteoporosis.  Repetitive spaced learning was employed today to elicit superior memory formation and behavioral change.  Class 2 severe obesity  with serious comorbidity and body mass index (BMI) of 37.0 to 37.9 in adult, unspecified obesity type (Tina [name]).  Tina [name] is currently  in the action stage of change. As such, her goal is to continue with weight loss efforts. She has agreed to the Category 3 Plan.   She will work on meal planning and intentional eating. We discussed choices for breakfast.  Exercise goals: Tina [name] will continue kickboxing.  Behavioral modification strategies: increasing lean protein intake, decreasing simple carbohydrates, increasing vegetables, increasing water intake, ways to avoid boredom eating, ways to avoid night time snacking, better snacking choices, emotional eating strategies and planning for success.  Tina [name] has agreed to follow-up with our clinic in 2 weeks. She was informed of the importance of frequent follow-up visits to maximize her success with intensive lifestyle modifications for her multiple health conditions.   Tina [name] was informed we would discuss her lab results at her next visit unless there is a critical issue that needs to be addressed sooner. Tina [name] agreed to keep her next visit at the agreed upon time to discuss these results.  Objective:   Blood pressure (!) 142/77, pulse 89, temperature 98.4 F (36.9 C), height 5\' 6"  (1.676 m), weight 230 lb (104.3 kg), last menstrual period 03/20/2019, SpO2 100 %. Body mass index is 37.12 kg/m.  General: Cooperative, alert, well developed, in no acute distress. HEENT: Conjunctivae and lids unremarkable. Cardiovascular: Regular rhythm.  Lungs: Normal work of breathing. Neurologic: No focal deficits.   Lab Results  Component Value Date   CREATININE 1.3 (A) 12/08/2018   BUN 22 (H) 04/20/2018   NA 138 12/08/2018   K 7.1 (A) 12/08/2018   CL 105 12/08/2018   CO2 24 (A) 12/08/2018   Lab Results  Component Value Date   ALT 18 12/08/2018   AST 14 12/08/2018   ALKPHOS 60 04/20/2018   BILITOT 0.8 04/20/2018   Lab Results  Component Value Date   HGBA1C 8.4 12/08/2018   HGBA1C 8.5 (H) 10/20/2018   HGBA1C 8.5 (H) 02/15/2018   HGBA1C 8.4 (H) 11/03/2017   HGBA1C 7.5 (H)  05/18/2015   No results found for: INSULIN Lab Results  Component Value Date   TSH 0.947 11/03/2017   Lab Results  Component Value Date   CHOL 204 (A) 12/08/2018   HDL 48 12/08/2018   LDLCALC 91 12/08/2018   TRIG 323 (A) 12/08/2018   CHOLHDL 6.0 10/17/2011   Lab Results  Component Value Date   WBC 6.3 12/08/2018   HGB 11.3 (A) 12/08/2018   HCT 35 (A) 12/08/2018   MCV 80.6 04/20/2018   PLT 348 04/20/2018   Lab Results  Component Value Date   IRON 114 11/01/2015   TIBC 372 11/01/2015   FERRITIN 35 11/01/2015   Attestation Statements:   Reviewed by clinician on day of visit: allergies, medications, problem list, medical history, surgical history, family history, social history, and previous encounter notes.  Migdalia Dk, am acting as Location manager for [employer], DO   I have reviewed the above documentation for accuracy and completeness, and I agree with the above. Jearld Lesch, DO

## 2019-04-13 DIAGNOSIS — I639 Cerebral infarction, unspecified: Secondary | ICD-10-CM | POA: Diagnosis not present

## 2019-04-13 DIAGNOSIS — Z6838 Body mass index (BMI) 38.0-38.9, adult: Secondary | ICD-10-CM | POA: Diagnosis not present

## 2019-04-13 DIAGNOSIS — E559 Vitamin D deficiency, unspecified: Secondary | ICD-10-CM | POA: Diagnosis not present

## 2019-04-13 DIAGNOSIS — E1140 Type 2 diabetes mellitus with diabetic neuropathy, unspecified: Secondary | ICD-10-CM | POA: Diagnosis not present

## 2019-04-13 DIAGNOSIS — I10 Essential (primary) hypertension: Secondary | ICD-10-CM | POA: Diagnosis not present

## 2019-04-13 DIAGNOSIS — E7800 Pure hypercholesterolemia, unspecified: Secondary | ICD-10-CM | POA: Diagnosis not present

## 2019-04-13 DIAGNOSIS — E1142 Type 2 diabetes mellitus with diabetic polyneuropathy: Secondary | ICD-10-CM | POA: Diagnosis not present

## 2019-04-13 LAB — HEMOGLOBIN A1C
Est. average glucose Bld gHb Est-mCnc: 243 mg/dL
Hgb A1c MFr Bld: 10.1 % — ABNORMAL HIGH (ref 4.8–5.6)

## 2019-04-13 LAB — COMPREHENSIVE METABOLIC PANEL
ALT: 20 IU/L (ref 0–32)
AST: 17 IU/L (ref 0–40)
Albumin/Globulin Ratio: 1.4 (ref 1.2–2.2)
Albumin: 3.7 g/dL — ABNORMAL LOW (ref 3.8–4.8)
Alkaline Phosphatase: 93 IU/L (ref 39–117)
BUN/Creatinine Ratio: 12 (ref 9–23)
BUN: 13 mg/dL (ref 6–24)
Bilirubin Total: <0.2 mg/dL (ref 0.0–1.2)
CO2: 27 mmol/L (ref 20–29)
Calcium: 9.1 mg/dL (ref 8.7–10.2)
Chloride: 98 mmol/L (ref 96–106)
Creatinine, Ser: 1.09 mg/dL — ABNORMAL HIGH (ref 0.57–1.00)
GFR calc Af Amer: 69 mL/min/{1.73_m2} (ref >59)
GFR calc non Af Amer: 60 mL/min/{1.73_m2} (ref >59)
Globulin, Total: 2.7 g/dL (ref 1.5–4.5)
Glucose: 323 mg/dL — ABNORMAL HIGH (ref 65–99)
Potassium: 5 mmol/L (ref 3.5–5.2)
Sodium: 136 mmol/L (ref 134–144)
Total Protein: 6.4 g/dL (ref 6.0–8.5)

## 2019-04-13 LAB — VITAMIN D 25 HYDROXY (VIT D DEFICIENCY, FRACTURES): Vit D, 25-Hydroxy: 32.1 ng/mL (ref 30.0–100.0)

## 2019-04-13 LAB — T4, FREE: Free T4: 1.04 ng/dL (ref 0.82–1.77)

## 2019-04-13 LAB — T3: T3, Total: 90 ng/dL (ref 71–180)

## 2019-04-13 LAB — VITAMIN B12: Vitamin B-12: 328 pg/mL (ref 232–1245)

## 2019-04-13 LAB — TSH: TSH: 1.1 u[IU]/mL (ref 0.450–4.500)

## 2019-04-26 ENCOUNTER — Encounter (INDEPENDENT_AMBULATORY_CARE_PROVIDER_SITE_OTHER): Payer: Self-pay | Admitting: Bariatrics

## 2019-04-26 ENCOUNTER — Ambulatory Visit (INDEPENDENT_AMBULATORY_CARE_PROVIDER_SITE_OTHER): Payer: Federal, State, Local not specified - PPO | Admitting: Bariatrics

## 2019-04-26 ENCOUNTER — Other Ambulatory Visit: Payer: Self-pay

## 2019-04-26 VITALS — BP 137/80 | HR 79 | Temp 98.4°F | Ht 66.0 in | Wt 232.0 lb

## 2019-04-26 DIAGNOSIS — Z9189 Other specified personal risk factors, not elsewhere classified: Secondary | ICD-10-CM | POA: Diagnosis not present

## 2019-04-26 DIAGNOSIS — F3289 Other specified depressive episodes: Secondary | ICD-10-CM

## 2019-04-26 DIAGNOSIS — Z6837 Body mass index (BMI) 37.0-37.9, adult: Secondary | ICD-10-CM | POA: Diagnosis not present

## 2019-04-26 DIAGNOSIS — R5383 Other fatigue: Secondary | ICD-10-CM

## 2019-04-26 DIAGNOSIS — I10 Essential (primary) hypertension: Secondary | ICD-10-CM

## 2019-04-26 DIAGNOSIS — E119 Type 2 diabetes mellitus without complications: Secondary | ICD-10-CM

## 2019-04-26 DIAGNOSIS — Z794 Long term (current) use of insulin: Secondary | ICD-10-CM | POA: Diagnosis not present

## 2019-04-26 DIAGNOSIS — R0602 Shortness of breath: Secondary | ICD-10-CM

## 2019-04-26 MED ORDER — BUPROPION HCL ER (SR) 150 MG PO TB12: 150.0000 mg | ORAL_TABLET | Freq: Every day | ORAL | 0 refills | Status: DC

## 2019-04-26 NOTE — Progress Notes (Signed)
Chief Complaint:   OBESITY Tina [name] is here to discuss her progress with her obesity treatment plan along with follow-up of her obesity related diagnoses. Tina [name] is on the Category 3 Plan and states she is following her eating plan approximately 65% of the time. Tina [name] states she is kickboxing 50 minutes 2 times per week.  Today's visit was #: 45 Starting weight: 238 lbs Starting date: 11/03/2017 Today's weight: 232 lbs Today's date: 04/26/2019 Total lbs lost to date: 6 Total lbs lost since last in-office visit: 0  Interim History: Tina [name] is up 2 lbs and doing well overall.  Subjective:   Essential hypertension. Blood pressure is controlled.  BP Readings from Last 3 Encounters:  04/26/19 137/80  04/12/19 (!) 142/77  04/07/19 (!) 151/94   Lab Results  Component Value Date   CREATININE 1.09 (H) 04/12/2019   CREATININE 1.3 (A) 12/08/2018   CREATININE 1.11 (H) 04/20/2018   Other fatigue. Last IC was 2176; IC today 2194.  SOB (shortness of breath) on exertion. Tina [name] notes increasing shortness of breath with exercising and seems to be worsening over time with weight gain. She notes getting out of breath sooner with activity than she used to. Tina [name] denies shortness of breath at rest or orthopnea. Last IC was 2176; IC today 2194.  Other depression, with emotional eating. Tina [name] is struggling with emotional eating and using food for comfort to the extent that it is negatively impacting her health. She has been working on behavior modification techniques to help reduce her emotional eating and has been somewhat successful. She shows no sign of suicidal or homicidal ideations. Tina [name] reports Wellbutrin is helping with her emotional eating.  Type 2 diabetes mellitus without complication, with long-term current use of insulin (Tina [name]). Last A1c 10.1 on 04/12/2019; previously was 8.4.   Lab Results  Component Value Date   HGBA1C 10.1 (H) 04/12/2019   HGBA1C 8.4  12/08/2018   HGBA1C 8.5 (H) 10/20/2018   Lab Results  Component Value Date   LDLCALC 91 12/08/2018   CREATININE 1.09 (H) 04/12/2019   No results found for: INSULIN  At risk for hypoglycemia. Tina [name] is at increased risk for hypoglycemia due to diabetes mellitus type II and dietary changes.   Assessment/Plan:   Essential hypertension. Tina [name] is working on healthy weight loss and exercise to improve blood pressure control. We will watch for signs of hypotension as she continues her lifestyle modifications. She will continue her medication as directed.  Other fatigue. Fatigue may be related to obesity, depression or many other causes. Labs will be ordered, and in the meanwhile, Tina [name] will focus on self care including making healthy food choices, increasing physical activity and focusing on stress reduction. We discussed IC results with the patient.  SOB (shortness of breath) on exertion. Tina [name]'s shortness of breath appears to be obesity related and exercise induced. She has agreed to work on weight loss and gradually increase exercise to treat her exercise induced shortness of breath. Will continue to monitor closely. We discussed IC results with the patient.  Other depression, with emotional eating. Behavior modification techniques were discussed today to help Tina [name] deal with her emotional/non-hunger eating behaviors.  Orders and follow up as documented in patient record. Tina [name] was given a prescription for buPROPion (WELLBUTRIN SR) 150 MG 12 hr tablet 1 daily #30 with 0 refills.  Type 2 diabetes mellitus without complication, with long-term current use of insulin (Tina [name]). Good blood sugar control is important to decrease the likelihood  of diabetic complications such as nephropathy, neuropathy, limb loss, blindness, coronary artery disease, and death. Intensive lifestyle modification including diet, exercise and weight loss are the first line of treatment for diabetes. Tina [name] will  follow-up with the endocrinologist. She will be using an external glucometer Tina [name]).  At risk for hypoglycemia. Tina [name] was given approximately 15 minutes of counseling today regarding prevention of hypoglycemia. She was advised of symptoms of hypoglycemia. Tina [name] was instructed to avoid skipping meals, eat regular protein rich meals and schedule low calorie snacks as needed.   Repetitive spaced learning was employed today to elicit superior memory formation and behavioral change.  Class 2 severe obesity with serious comorbidity and body mass index (BMI) of 37.0 to 37.9 in adult, unspecified obesity type (Tina [name]).  Tina [name] is currently in the action stage of change. As such, her goal is to continue with weight loss efforts. She has agreed to the Category 3 Plan.   She will work on meal planning and will increase her adherence to the plan.  We independently reviewed with the patient labs including CMP, lipids, Vitamin D, A1c, and thyroid panel.  Exercise goals: All adults should avoid inactivity. Some physical activity is better than none, and adults who participate in any amount of physical activity gain some health benefits.  Behavioral modification strategies: increasing lean protein intake, decreasing simple carbohydrates, increasing vegetables, increasing water intake, decreasing eating out, no skipping meals, meal planning and cooking strategies, keeping healthy foods in the home, dealing with family or coworker sabotage, travel eating strategies, holiday eating strategies  and celebration eating strategies.  Tina [name] has agreed to follow-up with our clinic in 2 weeks. She was informed of the importance of frequent follow-up visits to maximize her success with intensive lifestyle modifications for her multiple health conditions.   Objective:   Blood pressure 137/80, pulse 79, temperature 98.4 F (36.9 C), height 5\' 6"  (1.676 m), weight 232 lb (105.2 kg), SpO2 100 %. Body mass  index is 37.45 kg/m.  General: Cooperative, alert, well developed, in no acute distress. HEENT: Conjunctivae and lids unremarkable. Cardiovascular: Regular rhythm.  Lungs: Normal work of breathing. Neurologic: No focal deficits.   Lab Results  Component Value Date   CREATININE 1.09 (H) 04/12/2019   BUN 13 04/12/2019   NA 136 04/12/2019   K 5.0 04/12/2019   CL 98 04/12/2019   CO2 27 04/12/2019   Lab Results  Component Value Date   ALT 20 04/12/2019   AST 17 04/12/2019   ALKPHOS 93 04/12/2019   BILITOT <0.2 04/12/2019   Lab Results  Component Value Date   HGBA1C 10.1 (H) 04/12/2019   HGBA1C 8.4 12/08/2018   HGBA1C 8.5 (H) 10/20/2018   HGBA1C 8.5 (H) 02/15/2018   HGBA1C 8.4 (H) 11/03/2017   No results found for: INSULIN Lab Results  Component Value Date   TSH 1.100 04/12/2019   Lab Results  Component Value Date   CHOL 204 (A) 12/08/2018   HDL 48 12/08/2018   LDLCALC 91 12/08/2018   TRIG 323 (A) 12/08/2018   CHOLHDL 6.0 10/17/2011   Lab Results  Component Value Date   WBC 6.3 12/08/2018   HGB 11.3 (A) 12/08/2018   HCT 35 (A) 12/08/2018   MCV 80.6 04/20/2018   PLT 348 04/20/2018   Lab Results  Component Value Date   IRON 114 11/01/2015   TIBC 372 11/01/2015   FERRITIN 35 11/01/2015   Attestation Statements:   Reviewed by clinician on day of visit: allergies, medications,  problem list, medical history, surgical history, family history, social history, and previous encounter notes.  Tina [name], am acting as Location manager for [employer], DO   I have reviewed the above documentation for accuracy and completeness, and I agree with the above. Jearld Lesch, DO

## 2019-05-17 ENCOUNTER — Ambulatory Visit (INDEPENDENT_AMBULATORY_CARE_PROVIDER_SITE_OTHER): Payer: Federal, State, Local not specified - PPO | Admitting: Bariatrics

## 2019-05-17 DIAGNOSIS — I639 Cerebral infarction, unspecified: Secondary | ICD-10-CM | POA: Diagnosis not present

## 2019-05-17 DIAGNOSIS — E559 Vitamin D deficiency, unspecified: Secondary | ICD-10-CM | POA: Diagnosis not present

## 2019-05-17 DIAGNOSIS — E7800 Pure hypercholesterolemia, unspecified: Secondary | ICD-10-CM | POA: Diagnosis not present

## 2019-05-17 DIAGNOSIS — I10 Essential (primary) hypertension: Secondary | ICD-10-CM | POA: Diagnosis not present

## 2019-05-17 DIAGNOSIS — E1142 Type 2 diabetes mellitus with diabetic polyneuropathy: Secondary | ICD-10-CM | POA: Diagnosis not present

## 2019-05-17 DIAGNOSIS — E1140 Type 2 diabetes mellitus with diabetic neuropathy, unspecified: Secondary | ICD-10-CM | POA: Diagnosis not present

## 2019-05-24 ENCOUNTER — Other Ambulatory Visit: Payer: Self-pay

## 2019-05-24 ENCOUNTER — Encounter: Payer: Self-pay | Admitting: Sports Medicine

## 2019-05-24 ENCOUNTER — Ambulatory Visit (INDEPENDENT_AMBULATORY_CARE_PROVIDER_SITE_OTHER): Payer: Federal, State, Local not specified - PPO | Admitting: Sports Medicine

## 2019-05-24 DIAGNOSIS — M79675 Pain in left toe(s): Secondary | ICD-10-CM | POA: Diagnosis not present

## 2019-05-24 DIAGNOSIS — M2141 Flat foot [pes planus] (acquired), right foot: Secondary | ICD-10-CM

## 2019-05-24 DIAGNOSIS — M2040 Other hammer toe(s) (acquired), unspecified foot: Secondary | ICD-10-CM

## 2019-05-24 DIAGNOSIS — M79674 Pain in right toe(s): Secondary | ICD-10-CM | POA: Diagnosis not present

## 2019-05-24 DIAGNOSIS — M2142 Flat foot [pes planus] (acquired), left foot: Secondary | ICD-10-CM

## 2019-05-24 DIAGNOSIS — E1142 Type 2 diabetes mellitus with diabetic polyneuropathy: Secondary | ICD-10-CM

## 2019-05-24 DIAGNOSIS — B351 Tinea unguium: Secondary | ICD-10-CM

## 2019-05-24 NOTE — Progress Notes (Signed)
Subjective: Tina [name] is a 49 y.o. female patient with history of diabetes who presents to office today complaining of long,mildly painful nails  while ambulating in shoes; unable to trim, reports that she has her nails painted but her husband has noticed that her right great toenail is a little bit thicker than the other ones since he last trim them.  Patient states that the glucose reading this morning was not recorded A1c 9.1.  Patient reports that she always has pains all over but otherwise is doing okay.  Patient denies any new changes in medication or new problems.   Patient Active Problem List   Diagnosis Date Noted  . Long-term insulin use ([place]) 04/07/2019  . Depression 04/13/2018  . History of vertigo 03/22/2018  . Laboratory examination 03/22/2018  . Chronic diastolic (congestive) heart failure ([place]) 03/22/2018  . Other hyperlipidemia 02/15/2018  . Vitamin D deficiency 12/31/2017  . Class 2 severe obesity with serious comorbidity and body mass index (BMI) of 39.0 to 39.9 in adult ([place]) 11/18/2017  . Gait disturbance, post-stroke 04/23/2017  . Strain of gluteus medius 04/23/2017  . OSA (obstructive sleep apnea) 10/17/2015  . Periodic limb movement disorder (PLMD) 10/17/2015  . Essential hypertension 05/26/2015  . Diabetes mellitus ([place]) 05/26/2015  . Abdominal wall mass of right lower quadrant 05/25/2015  . Hemiparesis affecting right side as late effect of stroke ([place]) 11/09/2014  . Abdominal wall seroma   . Sepsis due to undetermined organism ([place]) 03/06/2014  . Nausea vomiting and diarrhea 03/06/2014  . Sepsis ([place]) 03/06/2014  . Tension headache 09/01/2013  . Nausea with vomiting ?Gastroparesis? 03/21/2013  . Spastic hemiplegia affecting dominant side ([place]) 02/16/2013  . Recurrent ventral incisional hernia s/p closure/repair w mesh 02/04/2013 01/26/2013  . Constipation, chronic 01/26/2013  . Abdominal pain, chronic, right lower quadrant 06/03/2012  . History of CVA  (cerebrovascular accident) 06/03/2012  . HTN (hypertension) 06/03/2012  . Diabetes mellitus type II, uncontrolled ([place]) 10/16/2011  . Obesity (BMI 30-39.9) 09/02/2011  . Rotator cuff tear 08/25/2011  . Sciatica 06/10/2011  . Paresthesias in right hand 06/10/2011  . Lumbago 05/09/2011  . Paresthesias 05/09/2011   Current Outpatient Medications on File Prior to Visit  Medication Sig Dispense Refill  . allopurinol (ZYLOPRIM) 100 MG tablet Take 100 mg by mouth every evening.    Marland Kitchen aspirin 325 MG tablet Take 325 mg by mouth at bedtime.     Marland Kitchen atorvastatin (LIPITOR) 10 MG tablet Take 1 tablet (10 mg total) by mouth daily. 30 tablet 0  . buPROPion (WELLBUTRIN SR) 150 MG 12 hr tablet Take 1 tablet (150 mg total) by mouth daily. 30 tablet 0  . cetirizine (ZYRTEC) 10 MG tablet Take 10 mg by mouth daily as needed for allergies.   11  . cloNIDine (CATAPRES) 0.1 MG tablet Take 0.1 mg by mouth 2 (two) times daily.  0  . colchicine 0.6 MG tablet Take 0.6 mg by mouth daily as needed (For gout flare-up.).   1  . DULoxetine (CYMBALTA) 20 MG capsule Take 20 mg by mouth daily. 20 mg am, 40 mg pm    . EPINEPHrine 0.3 mg/0.3 mL IJ SOAJ injection INJECT AS DIRECTED AS NEEDED FOR ALLERGIC REACTION 1 Device 2  . fluticasone (FLONASE) 50 MCG/ACT nasal spray daily as needed.     . furosemide (LASIX) 40 MG tablet Take 40 mg by mouth.    . gabapentin (NEURONTIN) 300 MG capsule Take 2 capsules (600 mg total) by mouth 3 (three)  times daily. (Patient taking differently: Take 600 mg by mouth at bedtime as needed. ) 180 capsule 2  . insulin aspart (NOVOLOG) 100 UNIT/ML injection Inject 5-15 Units into the skin 3 (three) times daily with meals. Per sliding scale--pt uses Omnipod and gets a basal rate     . omeprazole (PRILOSEC) 40 MG capsule Take 1 capsule by mouth once daily 90 capsule 0  . ondansetron (ZOFRAN-ODT) 4 MG disintegrating tablet Take 1 tablet (4 mg total) by mouth every 8 (eight) hours as needed for nausea or  vomiting. Dissolve under tongue 90 tablet 1  . Semaglutide (OZEMPIC, 0.25 OR 0.5 MG/DOSE, [place]) Inject 0.5 mg into the skin.    Marland Kitchen tiZANidine (ZANAFLEX) 2 MG tablet Take 1 tablet (2 mg total) by mouth at bedtime. TAKE 1 TABLET AT BEDTIME DAILY. 90 tablet 1  . traMADol (ULTRAM) 50 MG tablet Take 1 tablet (50 mg total) by mouth 2 (two) times daily. 60 tablet 5  . Vitamin D, Ergocalciferol, (DRISDOL) 1.25 MG (50000 UNIT) CAPS capsule Take 1 capsule (50,000 Units total) by mouth every 7 (seven) days. 4 capsule 0   No current facility-administered medications on file prior to visit.   Allergies  Allergen Reactions  . Contrast Media [Iodinated Diagnostic Agents] Other (See Comments)    Difficulty breathing  . Iohexol Hives, Nausea And Vomiting and Swelling     Desc: Magnevist-gadolinium-difficulty breathing, throat swelling   . Midazolam Hcl Anaphylaxis    Difficulty breathing  . Shellfish Allergy Anaphylaxis  . Valsartan Swelling  . Metformin And Related Diarrhea and Nausea And Vomiting  . Other Itching    Patient is allergic to all nuts except peanuts.   . Hydralazine   . Spironolactone Other (See Comments)    Elevated potassium   . Avandia [Rosiglitazone Maleate] Hives and Other (See Comments)  . Geodon [Ziprasidone] Other (See Comments)    UNKNOWN  . Kiwi Extract Itching and Swelling  . Latex Itching    Recent Results (from the past 2160 hour(s))  Comprehensive metabolic panel     Status: Abnormal   Collection Time: 04/12/19  1:13 PM  Result Value Ref Range   Glucose 323 (H) 65 - 99 mg/dL   BUN 13 6 - 24 mg/dL   Creatinine, Ser 1.09 (H) 0.57 - 1.00 mg/dL   GFR calc non Af Amer 60 >59 mL/min/1.73   GFR calc Af Amer 69 >59 mL/min/1.73   BUN/Creatinine Ratio 12 9 - 23   Sodium 136 134 - 144 mmol/L   Potassium 5.0 3.5 - 5.2 mmol/L   Chloride 98 96 - 106 mmol/L   CO2 27 20 - 29 mmol/L   Calcium 9.1 8.7 - 10.2 mg/dL   Total Protein 6.4 6.0 - 8.5 g/dL   Albumin 3.7 (L) 3.8 - 4.8  g/dL   Globulin, Total 2.7 1.5 - 4.5 g/dL   Albumin/Globulin Ratio 1.4 1.2 - 2.2   Bilirubin Total <0.2 0.0 - 1.2 mg/dL   Alkaline Phosphatase 93 39 - 117 IU/L   AST 17 0 - 40 IU/L   ALT 20 0 - 32 IU/L  Hemoglobin A1c     Status: Abnormal   Collection Time: 04/12/19  1:13 PM  Result Value Ref Range   Hgb A1c MFr Bld 10.1 (H) 4.8 - 5.6 %    Comment:          Prediabetes: 5.7 - 6.4          Diabetes: >6.4  Glycemic control for adults with diabetes: <7.0    Est. average glucose Bld gHb Est-mCnc 243 mg/dL  VITAMIN D 25 Hydroxy (Vit-D Deficiency, Fractures)     Status: None   Collection Time: 04/12/19  1:13 PM  Result Value Ref Range   Vit D, 25-Hydroxy 32.1 30.0 - 100.0 ng/mL    Comment: Vitamin D deficiency has been defined by the [place] practice guideline as a level of serum 25-OH vitamin D less than 20 ng/mL (1,2). The Endocrine Society went on to further define vitamin D insufficiency as a level between 21 and 29 ng/mL (2). 1. IOM (Institute of Medicine). 2010. Dietary reference    intakes for calcium and D. [place]: The    [employer]. 2. Holick MF, Binkley [place], Bischoff-Ferrari HA, et al.    Evaluation, treatment, and prevention of vitamin D    deficiency: an Endocrine Society clinical practice    guideline. JCEM. 2011 Jul; 96(7):1911-30.   Vitamin B12     Status: None   Collection Time: 04/12/19  1:13 PM  Result Value Ref Range   Vitamin B-12 328 232 - 1,245 pg/mL  T3     Status: None   Collection Time: 04/12/19  1:13 PM  Result Value Ref Range   T3, Total 90 71 - 180 ng/dL  T4, free     Status: None   Collection Time: 04/12/19  1:13 PM  Result Value Ref Range   Free T4 1.04 0.82 - 1.77 ng/dL  TSH     Status: None   Collection Time: 04/12/19  1:13 PM  Result Value Ref Range   TSH 1.100 0.450 - 4.500 uIU/mL    Objective: General: Patient is awake, alert, and oriented x 3 and in no acute  distress.  Integument: Skin is warm, dry and supple bilateral. Nails are mildly elongated thickened and dystrophic with subungual debris, consistent with onychomycosis, 1-5 bilateral.  Nails are well polish so difficult to see the right hallux nail at this time. No open lesions or preulcerative lesions present bilateral. Remaining integument unremarkable.  Vasculature:  Dorsalis Pedis pulse 1/4 bilateral. Posterior Tibial pulse  1/4 bilateral.  Capillary fill time <3 sec 1-5 bilateral. Positive hair growth to the level of the digits. Temperature gradient within normal limits. No varicosities present bilateral. No edema present bilateral.   Neurology: The patient has diminshed sensation measured with a 5.07/10g Semmes Weinstein Monofilament at all pedal sites bilateral . Vibratory sensation diminished bilateral with tuning fork. No Babinski sign present bilateral.   Musculoskeletal: Asymptomatic pes planus and hammertoe pedal deformities noted bilateral. Muscular strength 5/5 in all lower extremity muscular groups bilateral without pain on range of motion . No tenderness with calf compression bilateral.  Assessment and Plan: Problem List Items Addressed This Visit    None    Visit Diagnoses    Pain due to onychomycosis of toenails of both feet    -  Primary   Diabetic polyneuropathy associated with type 2 diabetes mellitus (HCC)       Pes planus of both feet       Hammer toe, unspecified laterality           -Examined patient. -Discussed and educated patient on diabetic foot care, especially with  regards to the vascular, neurological and musculoskeletal systems. . -Mechanically debrided all nails 1-5 bilateral using sterile nail nipper and filed with dremel without incident  -Advised patient to return to office without polish  next visit for fungal culture at right 1st toe if discoloration is still present -Continue with neuropathy medications -Office to contact patient re: orthotics/DM  shoes  -Answered all patient questions -Patient to return  in 3 months for at risk foot care -Patient advised to call the office if any problems or questions arise in the meantime.  Landis Martins, DPM

## 2019-06-09 DIAGNOSIS — E1142 Type 2 diabetes mellitus with diabetic polyneuropathy: Secondary | ICD-10-CM | POA: Diagnosis not present

## 2019-06-09 DIAGNOSIS — E118 Type 2 diabetes mellitus with unspecified complications: Secondary | ICD-10-CM | POA: Diagnosis not present

## 2019-06-09 DIAGNOSIS — E559 Vitamin D deficiency, unspecified: Secondary | ICD-10-CM | POA: Diagnosis not present

## 2019-06-09 DIAGNOSIS — I10 Essential (primary) hypertension: Secondary | ICD-10-CM | POA: Diagnosis not present

## 2019-06-09 DIAGNOSIS — E7800 Pure hypercholesterolemia, unspecified: Secondary | ICD-10-CM | POA: Diagnosis not present

## 2019-06-09 DIAGNOSIS — Z6838 Body mass index (BMI) 38.0-38.9, adult: Secondary | ICD-10-CM | POA: Diagnosis not present

## 2019-06-09 DIAGNOSIS — E1140 Type 2 diabetes mellitus with diabetic neuropathy, unspecified: Secondary | ICD-10-CM | POA: Diagnosis not present

## 2019-06-09 DIAGNOSIS — I639 Cerebral infarction, unspecified: Secondary | ICD-10-CM | POA: Diagnosis not present

## 2019-06-21 DIAGNOSIS — N390 Urinary tract infection, site not specified: Secondary | ICD-10-CM | POA: Diagnosis not present

## 2019-06-22 ENCOUNTER — Ambulatory Visit (INDEPENDENT_AMBULATORY_CARE_PROVIDER_SITE_OTHER): Payer: Federal, State, Local not specified - PPO | Admitting: Family Medicine

## 2019-06-22 ENCOUNTER — Other Ambulatory Visit: Payer: Self-pay

## 2019-06-22 ENCOUNTER — Encounter (INDEPENDENT_AMBULATORY_CARE_PROVIDER_SITE_OTHER): Payer: Self-pay | Admitting: Family Medicine

## 2019-06-22 VITALS — BP 145/88 | HR 96 | Temp 98.1°F | Ht 66.0 in | Wt 233.0 lb

## 2019-06-22 DIAGNOSIS — Z6837 Body mass index (BMI) 37.0-37.9, adult: Secondary | ICD-10-CM

## 2019-06-22 DIAGNOSIS — Z794 Long term (current) use of insulin: Secondary | ICD-10-CM

## 2019-06-22 DIAGNOSIS — E66812 Obesity, class 2: Secondary | ICD-10-CM

## 2019-06-22 DIAGNOSIS — E1169 Type 2 diabetes mellitus with other specified complication: Secondary | ICD-10-CM

## 2019-06-22 DIAGNOSIS — Z9189 Other specified personal risk factors, not elsewhere classified: Secondary | ICD-10-CM | POA: Diagnosis not present

## 2019-06-22 DIAGNOSIS — F3289 Other specified depressive episodes: Secondary | ICD-10-CM | POA: Diagnosis not present

## 2019-06-22 DIAGNOSIS — E559 Vitamin D deficiency, unspecified: Secondary | ICD-10-CM | POA: Diagnosis not present

## 2019-06-22 MED ORDER — VITAMIN D (ERGOCALCIFEROL) 1.25 MG (50000 UNIT) PO CAPS: 50000.0000 [IU] | ORAL_CAPSULE | ORAL | 0 refills | Status: DC

## 2019-06-22 MED ORDER — BUPROPION HCL ER (SR) 150 MG PO TB12: 150.0000 mg | ORAL_TABLET | Freq: Every day | ORAL | 0 refills | Status: DC

## 2019-06-23 NOTE — Progress Notes (Signed)
Chief Complaint:   OBESITY Tina [name] is here to discuss her progress with her obesity treatment plan along with follow-up of her obesity related diagnoses. Tina [name] is on the Category 3 Plan and states she is following her eating plan approximately 50-60% of the time. Tina [name] states she is doing physical therapy for sciatica and doing yoga for 30 minutes 7 times per week.  Today's visit was #: 72 Starting weight: 238 lbs Starting date: 11/03/2017 Today's weight: 233 lbs Today's date: 06/22/2019 Total lbs lost to date: 5 Total lbs lost since last in-office visit: 0  Interim History: Tina [name] has sciatica that she has been dealing with, but she notes some improvement recently. She is moving better lately with physical therapy. Her primary care physician has changed her insulin and diabetes medications recently due to concerns about potential renal function.  Subjective:   1. Type 2 diabetes mellitus with other specified complication, with long-term current use of insulin ([place]) Tina [name]'s A1c was 10.1 on 04/12/2019. The past 3 months she states her home BGs have been better  (80-125). Her home BGs checks are recommended regularly. She is working on diet, exercise, and weight loss.  2. Other depression, with emotional eating Tina [name] notes decreased cravings since starting Wellbutrin, and she is pleased with results. She is tolerating her medications well and will continue with diet and weight loss.  3. Vitamin D deficiency Tina [name] is tolerating her medication well with no side effects.  4. At risk for hyperglycemia Tina [name] is at increased risk for hyperglycemia due to changes in diet, diagnosis of diabetes, and/or insulin use.   Assessment/Plan:   1. Type 2 diabetes mellitus with other specified complication, with long-term current use of insulin (HCC) Good blood sugar control is important to decrease the likelihood of diabetic complications such as nephropathy, neuropathy, limb loss,  blindness, coronary artery disease, and death. Intensive lifestyle modification including diet, exercise and weight loss are the first line of treatment for diabetes. Kaysha will continue her insulin per her primary care physician and specialist. She will continue strict home glucose monitoring.  2. Other depression, with emotional eating Behavior modification techniques were discussed today to help Brooke deal with her emotional/non-hunger eating behaviors. We will refill Wellbutrin SR for 1 month. Orders and follow up as documented in patient record.   - buPROPion (WELLBUTRIN SR) 150 MG 12 hr tablet; Take 1 tablet (150 mg total) by mouth daily.  Dispense: 30 tablet; Refill: 0  3. Vitamin D deficiency Low Vitamin D level contributes to fatigue and are associated with obesity, breast, and colon cancer. We will refill prescription Vitamin D for 1 month. Tina [name] will follow-up for routine testing of Vitamin D, at least 2-3 times per year to avoid over-replacement.  - Vitamin D, Ergocalciferol, (DRISDOL) 1.25 MG (50000 UNIT) CAPS capsule; Take 1 capsule (50,000 Units total) by mouth every 7 (seven) days.  Dispense: 4 capsule; Refill: 0  4. At risk for hyperglycemia Tina [name] was given approximately 15 minutes of counseling today regarding prevention of hyperglycemia. She was advised of hyperglycemia causes and the fact hyperglycemia is often asymptomatic. Tina [name] was instructed to avoid skipping meals, eat regular protein rich meals and schedule low calorie but protein rich snacks as needed.   Repetitive spaced learning was employed today to elicit superior memory formation and behavioral change  5. Class 2 severe obesity with serious comorbidity and body mass index (BMI) of 37.0 to 37.9 in adult, unspecified obesity type (HCC) Tina [name] is currently in the  action stage of change. As such, her goal is to continue with weight loss efforts. She has agreed to the Category 3 Plan.   Exercise goals: As is,  as tolerated.  Behavioral modification strategies: increasing lean protein intake and meal planning and cooking strategies.  Tina [name] has agreed to follow-up with our clinic in 3 weeks. She was informed of the importance of frequent follow-up visits to maximize her success with intensive lifestyle modifications for her multiple health conditions.   Objective:   Blood pressure (!) 145/88, pulse 96, temperature 98.1 F (36.7 C), temperature source Oral, height 5\' 6"  (1.676 m), weight 233 lb (105.7 kg), SpO2 100 %. Body mass index is 37.61 kg/m.  General: Cooperative, alert, well developed, in no acute distress. HEENT: Conjunctivae and lids unremarkable. Cardiovascular: Regular rhythm.  Lungs: Normal work of breathing. Neurologic: No focal deficits.   Lab Results  Component Value Date   CREATININE 1.09 (H) 04/12/2019   BUN 13 04/12/2019   NA 136 04/12/2019   K 5.0 04/12/2019   CL 98 04/12/2019   CO2 27 04/12/2019   Lab Results  Component Value Date   ALT 20 04/12/2019   AST 17 04/12/2019   ALKPHOS 93 04/12/2019   BILITOT <0.2 04/12/2019   Lab Results  Component Value Date   HGBA1C 10.1 (H) 04/12/2019   HGBA1C 8.4 12/08/2018   HGBA1C 8.5 (H) 10/20/2018   HGBA1C 8.5 (H) 02/15/2018   HGBA1C 8.4 (H) 11/03/2017   No results found for: INSULIN Lab Results  Component Value Date   TSH 1.100 04/12/2019   Lab Results  Component Value Date   CHOL 204 (A) 12/08/2018   HDL 48 12/08/2018   LDLCALC 91 12/08/2018   TRIG 323 (A) 12/08/2018   CHOLHDL 6.0 10/17/2011   Lab Results  Component Value Date   WBC 6.3 12/08/2018   HGB 11.3 (A) 12/08/2018   HCT 35 (A) 12/08/2018   MCV 80.6 04/20/2018   PLT 348 04/20/2018   Lab Results  Component Value Date   IRON 114 11/01/2015   TIBC 372 11/01/2015   FERRITIN 35 11/01/2015    Attestation Statements:   Reviewed by clinician on day of visit: allergies, medications, problem list, medical history, surgical history, family  history, social history, and previous encounter notes.   I, Trixie Dredge, am acting as Location manager for Dennard Nip, MD.  I have reviewed the above documentation for accuracy and completeness, and I agree with the above. -  Dennard Nip, MD

## 2019-07-01 ENCOUNTER — Encounter: Payer: Federal, State, Local not specified - PPO | Admitting: Physical Medicine & Rehabilitation

## 2019-07-04 ENCOUNTER — Other Ambulatory Visit: Payer: Self-pay

## 2019-07-04 ENCOUNTER — Ambulatory Visit (INDEPENDENT_AMBULATORY_CARE_PROVIDER_SITE_OTHER): Payer: Federal, State, Local not specified - PPO | Admitting: Ophthalmology

## 2019-07-04 ENCOUNTER — Encounter (INDEPENDENT_AMBULATORY_CARE_PROVIDER_SITE_OTHER): Payer: Self-pay | Admitting: Ophthalmology

## 2019-07-04 DIAGNOSIS — E113511 Type 2 diabetes mellitus with proliferative diabetic retinopathy with macular edema, right eye: Secondary | ICD-10-CM | POA: Diagnosis not present

## 2019-07-04 DIAGNOSIS — E113552 Type 2 diabetes mellitus with stable proliferative diabetic retinopathy, left eye: Secondary | ICD-10-CM | POA: Diagnosis not present

## 2019-07-04 NOTE — Assessment & Plan Note (Signed)

## 2019-07-04 NOTE — Patient Instructions (Signed)
Patient understands critical importance of good blood sugar control to maintain quiescent state of diabetic retinopathy in each eye.

## 2019-07-04 NOTE — Progress Notes (Signed)
07/04/2019     CHIEF COMPLAINT Patient presents for Retina Follow Up   HISTORY OF PRESENT ILLNESS: Tina [name] is a 49 y.o. female who presents to the clinic today for:   HPI    Retina Follow Up    Patient presents with  Diabetic Retinopathy.  In both eyes.  Duration of 4 months.  Since onset it is stable.          Comments    4 month f.u - OCT OU Patient denies change in vision and overall has no complaints. LBS 128 // A1C 9       Last edited by Gerda Diss on 07/04/2019  9:48 AM. (History)      Referring physician: [name], DO [address] [place],  [place] [address]  HISTORICAL INFORMATION:   Selected notes from the MEDICAL RECORD NUMBER    Lab Results  Component Value Date   HGBA1C 10.1 (H) 04/12/2019     CURRENT MEDICATIONS: No current outpatient medications on file. (Ophthalmic Drugs)   No current facility-administered medications for this visit. (Ophthalmic Drugs)   Current Outpatient Medications (Other)  Medication Sig  . allopurinol (ZYLOPRIM) 100 MG tablet Take 100 mg by mouth every evening.  Marland Kitchen aspirin 325 MG tablet Take 325 mg by mouth at bedtime.   Marland Kitchen atorvastatin (LIPITOR) 10 MG tablet Take 1 tablet (10 mg total) by mouth daily.  Marland Kitchen buPROPion (WELLBUTRIN SR) 150 MG 12 hr tablet Take 1 tablet (150 mg total) by mouth daily.  . cephALEXin (KEFLEX) 500 MG capsule Take 500 mg by mouth 2 (two) times daily.  . cetirizine (ZYRTEC) 10 MG tablet Take 10 mg by mouth daily as needed for allergies.   . cloNIDine (CATAPRES) 0.1 MG tablet Take 0.1 mg by mouth 2 (two) times daily.  . colchicine 0.6 MG tablet Take 0.6 mg by mouth daily as needed (For gout flare-up.).   Marland Kitchen DULoxetine (CYMBALTA) 20 MG capsule Take 20 mg by mouth daily. 20 mg am, 40 mg pm  . EPINEPHrine 0.3 mg/0.3 mL IJ SOAJ injection INJECT AS DIRECTED AS NEEDED FOR ALLERGIC REACTION  . fluticasone (FLONASE) 50 MCG/ACT nasal spray daily as needed.   . furosemide (LASIX) 40  MG tablet Take 40 mg by mouth.  . gabapentin (NEURONTIN) 300 MG capsule Take 2 capsules (600 mg total) by mouth 3 (three) times daily. (Patient taking differently: Take 600 mg by mouth at bedtime as needed. )  . insulin aspart (NOVOLOG) 100 UNIT/ML injection Inject 5-15 Units into the skin 3 (three) times daily with meals. Per sliding scale--pt uses Omnipod and gets a basal rate   . omeprazole (PRILOSEC) 40 MG capsule Take 1 capsule by mouth once daily  . ondansetron (ZOFRAN-ODT) 4 MG disintegrating tablet Take 1 tablet (4 mg total) by mouth every 8 (eight) hours as needed for nausea or vomiting. Dissolve under tongue  . Semaglutide (OZEMPIC, 0.25 OR 0.5 MG/DOSE, [place]) Inject 0.5 mg into the skin.  Marland Kitchen tiZANidine (ZANAFLEX) 2 MG tablet Take 1 tablet (2 mg total) by mouth at bedtime. TAKE 1 TABLET AT BEDTIME DAILY.  . traMADol (ULTRAM) 50 MG tablet Take 1 tablet (50 mg total) by mouth 2 (two) times daily.  . Vitamin D, Ergocalciferol, (DRISDOL) 1.25 MG (50000 UNIT) CAPS capsule Take 1 capsule (50,000 Units total) by mouth every 7 (seven) days.   No current facility-administered medications for this visit. (Other)      REVIEW OF SYSTEMS:  ALLERGIES Allergies  Allergen Reactions  . Contrast Media [Iodinated Diagnostic Agents] Other (See Comments)    Difficulty breathing  . Iohexol Hives, Nausea And Vomiting and Swelling     Desc: Magnevist-gadolinium-difficulty breathing, throat swelling   . Midazolam Hcl Anaphylaxis    Difficulty breathing  . Shellfish Allergy Anaphylaxis  . Valsartan Swelling  . Metformin And Related Diarrhea and Nausea And Vomiting  . Other Itching    Patient is allergic to all nuts except peanuts.   Wilder Glade [Dapagliflozin] Nausea Only and Other (See Comments)    Nausea, UTI, headaches and muscle aches  . Hydralazine   . Spironolactone Other (See Comments)    Elevated potassium   . Avandia [Rosiglitazone Maleate] Hives and Other (See Comments)  . Geodon  [Ziprasidone] Other (See Comments)    UNKNOWN  . Kiwi Extract Itching and Swelling  . Latex Itching    PAST MEDICAL HISTORY Past Medical History:  Diagnosis Date  . Allergy   . Anemia    DURING MENSES--HAS HEAVY BLEEDING WITH PERODS  . Anxiety   . Back pain, chronic    "ongoing"  . Blood transfusion    IN 2012  AFTER C -SECTION  . Cerebral thrombosis with cerebral infarction [facility]) JUNE 2011   RIGHT SIDED WEAKNESS ( ARM AND LEG ) AND SPASMS-remains with slight weakness and vertigo.  . Constipation   . Depression   . Diabetes mellitus   . Diabetic neuropathy ([place])    BOTH FEET --COMES AND GOES  . Edema, lower extremity   . Endometriosis   . Fatty liver   . GERD (gastroesophageal reflux disease)    with pregnancy  . H/O eye surgery   . Headache(784.0)    MIGRAINES--NOT REALLY HEADACHE-MORE LIKE PRESSURE SENSATION IN HEAD-FEELS DIZZIY AND  FAINT AS THE PRESSURE RESOLVES  . Hernia, incisional, RLQ, s/p lap repair Sep 2013 09/02/2011  . History of vertigo 03/22/2018  . Hx of migraines 10/19/2011  . Hypertension   . Leg pain, right    "like bad Charley horse"  . Multiple food allergies   . Panic disorder without agoraphobia   . Rash    HANDS, ARMS --STATES HX OF RASH EVER SINCE CHILDBIRTH/PREGNANCY.  STATES THE RASH OFTEN OCCURS WHEN SHE IS REALLY STRESSED."goes and comes-presently left ring finger"  . Restless leg syndrome    DIAGNOSED BY SLEEP STUDY - PT TOLD SHE DID NOT HAVE SLEEP APNEA  . Right rotator cuff tear    PAIN IN RIGHT SHOULDER  . SBO (small bowel obstruction) ([place]) 06/03/2012  . Shortness of breath   . Spastic hemiplegia affecting dominant side ([place])   . Stomach problems   . Stroke ([place])   . Ventral hernia    RIGHT LOWER QUADRANT-CAUSING SOME PAIN  . Weakness of right side of body    Past Surgical History:  Procedure Laterality Date  . APPLICATION OF WOUND VAC N/A 05/25/2015   Procedure: APPLICATION OF WOUND VAC;  Surgeon: Michael Boston, MD;  Location: [facility]  ORS;  Service: General;  Laterality: N/A;  . CESAREAN SECTION  2012  . COLONOSCOPY    . DIAGNOSTIC LAPAROSCOPY    . ESOPHAGOGASTRODUODENOSCOPY N/A 06/03/2012   Procedure: ESOPHAGOGASTRODUODENOSCOPY (EGD);  Surgeon: [name] Craver, MD;  Location: [facility] ENDOSCOPY;  Service: Endoscopy;  Laterality: N/A;  . EXCISION MASS ABDOMINAL N/A 05/25/2015   Procedure: ABDOMINAL WALL EXPLORATION EXCISION OF SEROMA REMOVAL OF REDUNDANT SKIN ;  Surgeon: Michael Boston, MD;  Location: [facility] ORS;  Service: General;  Laterality: N/A;  . EYE SURGERY     Eye laser for vessel hemorrhaging  . fybroid removal    . HERNIA REPAIR  10/03/11   ventral hernia repair  . INSERTION OF MESH N/A 02/04/2013   Procedure: INSERTION OF MESH;  Surgeon: Adin Hector, MD;  Location: [facility] ORS;  Service: General;  Laterality: N/A;  . UMBILICAL HERNIA REPAIR N/A 02/04/2013   Procedure: LAPAROSCOPIC ventral wall hernia repair LAPAROSCOPIC LYSIS OF ADHESIONS laparoscopic exploration of abdomen ;  Surgeon: Adin Hector, MD;  Location: [facility] ORS;  Service: General;  Laterality: N/A;  . UPPER GASTROINTESTINAL ENDOSCOPY    . URETER REVISION     Bilateral "twisted"  . UTERINE FIBROID SURGERY     2 SURGERIES FOR FIBROIDS  . VENTRAL HERNIA REPAIR  10/03/2011   Procedure: LAPAROSCOPIC VENTRAL HERNIA;  Surgeon: Adin Hector, MD;  Location: [facility] ORS;  Service: General;  Laterality: N/A;    FAMILY HISTORY Family History  Problem Relation Age of Onset  . Diabetes Father   . Kidney disease Father   . Depression Father   . Drug abuse Father   . Allergic rhinitis Mother   . Eczema Mother   . Urticaria Mother   . Depression Mother   . Anxiety disorder Mother   . Bipolar disorder Mother   . Alcoholism Mother   . Drug abuse Mother   . Eating disorder Mother   . Diabetes Maternal Grandmother   . Hyperlipidemia Paternal Grandmother   . Stroke Paternal Grandmother   . Eczema Sister   . Urticaria Sister   . Colon cancer Paternal Uncle   . Other Neg Hx    . Angioedema Neg Hx   . Asthma Neg Hx   . Colon polyps Neg Hx   . Esophageal cancer Neg Hx   . Rectal cancer Neg Hx   . Stomach cancer Neg Hx     SOCIAL HISTORY Social History   Tobacco Use  . Smoking status: Never Smoker  . Smokeless tobacco: Never Used  Vaping Use  . Vaping Use: Never used  Substance Use Topics  . Alcohol use: No    Alcohol/week: 0.0 standard drinks  . Drug use: No         OPHTHALMIC EXAM:  Base Eye Exam    Visual Acuity (Snellen - Linear)      Right Left   Dist [place] 20/25-2 20/25-+1       Tonometry (Tonopen, 9:51 AM)      Right Left   Pressure 12 13       Pupils      Pupils Shape React APD   Right PERRL Round Brisk None   Left PERRL Round Brisk None       Visual Fields (Counting fingers)      Left Right    Full Full       Extraocular Movement      Right Left    Full Full       Neuro/Psych    Oriented x3: Yes   Mood/Affect: Normal       Dilation    Both eyes: 1.0% Mydriacyl, 2.5% Phenylephrine @ 9:52 AM        Slit Lamp and Fundus Exam    External Exam      Right Left   External Normal Normal       Slit Lamp Exam      Right Left   Lids/Lashes Normal Normal   Conjunctiva/Sclera White and  quiet White and quiet   Cornea Clear Clear   Anterior Chamber Deep and quiet Deep and quiet   Iris Round and reactive Round and reactive   Lens Centered Posterior chamber intraocular lens, 2+ Posterior capsular opacification Posterior chamber intraocular lens   Anterior Vitreous Normal Normal       Fundus Exam      Right Left   Posterior Vitreous Vitrectomized Vitrectomized   Disc Normal Normal   C/D Ratio 0.1 0.1   Macula Microaneurysms, no exudates, no macular thickening Microaneurysms, no exudates, no macular thickening   Vessels PDR-quiet PDR-quiet   Periphery Good PRP, a attached Good PRP, a attached          IMAGING AND PROCEDURES  Imaging and Procedures for 07/04/19  OCT, Retina - OU - Both Eyes       Right  Eye Quality was good. Scan locations included subfoveal. Central Foveal Thickness: 310. Findings include cystoid macular edema.   Left Eye Quality was good. Scan locations included subfoveal. Central Foveal Thickness: 350. Progression has been stable.   Notes OD with chronic active minor clinically significant macular edema only by OCT.  Stable over the last 4 months.   OS, stable near normal.                ASSESSMENT/PLAN:  Stable treated proliferative diabetic retinopathy of left eye determined by examination associated with type 2 diabetes mellitus ([place]) The nature of regressed proliferative diabetic retinopathy was discussed with the patient. The patient was advised to maintain good glucose, blood pressure, monitor kidney function and serum lipid control as advised by personal physician. Rare risk for reactivation of progression exist with untreated severe anemia, untreated renal failure, untreated heart failure, and smoking. Complete avoidance of smoking was recommended. The chance of recurrent proliferative diabetic retinopathy was discussed as well as the chance of vitreous hemorrhage for which further treatments may be necessary.   Explained to the patient that the quiescent  proliferative diabetic retinopathy disease is unlikely to ever worsen.  Worsening factors would include however severe anemia, hypertension out-of-control or impending renal failure.      ICD-10-CM   1. Diabetic macular edema of right eye with proliferative retinopathy associated with type 2 diabetes mellitus (HCC)  E11.3511 OCT, Retina - OU - Both Eyes  2. Stable treated proliferative diabetic retinopathy of left eye determined by examination associated with type 2 diabetes mellitus ([place])  K24.0973     1.  2.  3.  Ophthalmic Meds Ordered this visit:  No orders of the defined types were placed in this encounter.      Return in about 6 months (around 01/03/2020) for DILATE OU, OCT.  Patient  Instructions  Patient understands critical importance of good blood sugar control to maintain quiescent state of diabetic retinopathy in each eye.    Explained the diagnoses, plan, and follow up with the patient and they expressed understanding.  Patient expressed understanding of the importance of proper follow up care.   Clent Demark Revere Maahs M.D. Diseases & Surgery of the Retina and Vitreous Retina & Diabetic [place] 07/04/19     Abbreviations: M myopia (nearsighted); A astigmatism; H hyperopia (farsighted); P presbyopia; Mrx spectacle prescription;  CTL contact lenses; OD right eye; OS left eye; OU both eyes  XT exotropia; ET esotropia; PEK punctate epithelial keratitis; PEE punctate epithelial erosions; DES dry eye syndrome; MGD meibomian gland dysfunction; ATs artificial tears; PFAT's preservative free artificial tears; [place]oe nuclear sclerotic cataract; PSC posterior  subcapsular cataract; ERM epi-retinal membrane; PVD posterior vitreous detachment; RD retinal detachment; DM diabetes mellitus; DR diabetic retinopathy; NPDR non-proliferative diabetic retinopathy; PDR proliferative diabetic retinopathy; CSME clinically significant macular edema; DME diabetic macular edema; dbh dot blot hemorrhages; CWS cotton wool spot; POAG primary open angle glaucoma; C/D cup-to-disc ratio; HVF humphrey visual field; GVF goldmann visual field; OCT optical coherence tomography; IOP intraocular pressure; BRVO Branch retinal vein occlusion; CRVO central retinal vein occlusion; CRAO central retinal artery occlusion; BRAO branch retinal artery occlusion; RT retinal tear; SB scleral buckle; PPV pars plana vitrectomy; VH Vitreous hemorrhage; PRP panretinal laser photocoagulation; IVK intravitreal kenalog; VMT vitreomacular traction; MH Macular hole;  NVD neovascularization of the disc; NVE neovascularization elsewhere; AREDS age related eye disease study; ARMD age related macular degeneration; POAG primary open angle  glaucoma; EBMD epithelial/anterior basement membrane dystrophy; ACIOL anterior chamber intraocular lens; IOL intraocular lens; PCIOL posterior chamber intraocular lens; Phaco/IOL phacoemulsification with intraocular lens placement; [place] photorefractive keratectomy; LASIK laser assisted in situ keratomileusis; HTN hypertension; DM diabetes mellitus; COPD chronic obstructive pulmonary disease

## 2019-07-05 ENCOUNTER — Telehealth: Payer: Self-pay | Admitting: *Deleted

## 2019-07-05 ENCOUNTER — Encounter
Payer: Federal, State, Local not specified - PPO | Attending: Physical Medicine & Rehabilitation | Admitting: Physical Medicine & Rehabilitation

## 2019-07-05 ENCOUNTER — Encounter: Payer: Self-pay | Admitting: Physical Medicine & Rehabilitation

## 2019-07-05 ENCOUNTER — Other Ambulatory Visit: Payer: Self-pay

## 2019-07-05 VITALS — BP 161/84 | HR 90 | Temp 97.5°F | Ht 66.0 in | Wt 239.0 lb

## 2019-07-05 DIAGNOSIS — Z79891 Long term (current) use of opiate analgesic: Secondary | ICD-10-CM | POA: Diagnosis not present

## 2019-07-05 DIAGNOSIS — G894 Chronic pain syndrome: Secondary | ICD-10-CM | POA: Diagnosis not present

## 2019-07-05 DIAGNOSIS — Z5181 Encounter for therapeutic drug level monitoring: Secondary | ICD-10-CM | POA: Diagnosis not present

## 2019-07-05 NOTE — Progress Notes (Signed)
Subjective:    Patient ID: Tina [name], female    DOB: 1970-08-16, 49 y.o.   MRN: [id number]  HPI 49 year old female with history of remote left pontine as well as basal ganglia infarct with residual right hemiparesis returns today for follow-up visit.  The patient also has a history of sacroiliac dysfunction on the right side and has benefited in the past with sacroiliac injection.  At last visit she was planning to resume walking advised that 1/2 mile per day initially.  In last 6 mo has fallen 4 times, no significant injuries, she describes that her falls usually occur when she is not paying attention while walking Doing back exercises and leg exercises Uses chair for ADLs and grooming Sister helps her to cook/ Ambulates 1min outside per day  Chief complaint:  dropping objects with the right hand.  She does not notice any new numbness on the right side.  The patient does not feel weaker in the hands she just feels like sometimes objects slipped out of her hand.  No neck pain no radiating pain down into the hand.  No wrist pain. Remains independent with all self-care and mobility Pain Inventory Average Pain 7 Pain Right Now 6 My pain is sharp, burning and tingling  In the last 24 hours, has pain interfered with the following? General activity 6 Relation with others 6 Enjoyment of life 9 What TIME of day is your pain at its worst? all Sleep (in general) Poor  Pain is worse with: walking, bending, sitting, standing and some activites Pain improves with: medication Relief from Meds: 5  Mobility walk without assistance  Function disabled: date disabled .  Neuro/Psych No problems in this area  Prior Studies Any changes since last visit?  no  Physicians involved in your care Any changes since last visit?  no   Family History  Problem Relation Age of Onset  . Diabetes Father   . Kidney disease Father   . Depression Father   . Drug abuse Father   . Allergic  rhinitis Mother   . Eczema Mother   . Urticaria Mother   . Depression Mother   . Anxiety disorder Mother   . Bipolar disorder Mother   . Alcoholism Mother   . Drug abuse Mother   . Eating disorder Mother   . Diabetes Maternal Grandmother   . Hyperlipidemia Paternal Grandmother   . Stroke Paternal Grandmother   . Eczema Sister   . Urticaria Sister   . Colon cancer Paternal Uncle   . Other Neg Hx   . Angioedema Neg Hx   . Asthma Neg Hx   . Colon polyps Neg Hx   . Esophageal cancer Neg Hx   . Rectal cancer Neg Hx   . Stomach cancer Neg Hx    Social History   Socioeconomic History  . Marital status: Married    Spouse name: Annie Main  . Number of children: 3  . Years of education: BA degree  . Highest education level: Not on file  Occupational History  . Occupation: stay at home mom    Employer: UNEMPLOYED  Tobacco Use  . Smoking status: Never Smoker  . Smokeless tobacco: Never Used  Vaping Use  . Vaping Use: Never used  Substance and Sexual Activity  . Alcohol use: No    Alcohol/week: 0.0 standard drinks  . Drug use: No  . Sexual activity: Yes    Birth control/protection: None  Other Topics Concern  . Not on  file  Social History Narrative   Patient is married with 2 children.   Patient is right handed.   Patient has her  BA degree.   Patient drinks 1 cup daily.   Social Determinants of Health   Financial Resource Strain:   . Difficulty of Paying Living Expenses:   Food Insecurity: No Food Insecurity  . Worried About Charity fundraiser in the Last Year: Never true  . Ran Out of Food in the Last Year: Never true  Transportation Needs: No Transportation Needs  . Lack of Transportation (Medical): No  . Lack of Transportation (Non-Medical): No  Physical Activity:   . Days of Exercise per Week:   . Minutes of Exercise per Session:   Stress:   . Feeling of Stress :   Social Connections:   . Frequency of Communication with Friends and Family:   . Frequency of  Social Gatherings with Friends and Family:   . Attends Religious Services:   . Active Member of Clubs or Organizations:   . Attends Archivist Meetings:   Marland Kitchen Marital Status:    Past Surgical History:  Procedure Laterality Date  . APPLICATION OF WOUND VAC N/A 05/25/2015   Procedure: APPLICATION OF WOUND VAC;  Surgeon: Michael Boston, MD;  Location: [facility];  Service: General;  Laterality: N/A;  . CESAREAN SECTION  2012  . COLONOSCOPY    . DIAGNOSTIC LAPAROSCOPY    . ESOPHAGOGASTRODUODENOSCOPY N/A 06/03/2012   Procedure: ESOPHAGOGASTRODUODENOSCOPY (EGD);  Surgeon: [name] Craver, MD;  Location: [facility] ENDOSCOPY;  Service: Endoscopy;  Laterality: N/A;  . EXCISION MASS ABDOMINAL N/A 05/25/2015   Procedure: ABDOMINAL WALL EXPLORATION EXCISION OF SEROMA REMOVAL OF REDUNDANT SKIN ;  Surgeon: Michael Boston, MD;  Location: [facility];  Service: General;  Laterality: N/A;  . EYE SURGERY     Eye laser for vessel hemorrhaging  . fybroid removal    . HERNIA REPAIR  10/03/11   ventral hernia repair  . INSERTION OF MESH N/A 02/04/2013   Procedure: INSERTION OF MESH;  Surgeon: Adin Hector, MD;  Location: [facility];  Service: General;  Laterality: N/A;  . UMBILICAL HERNIA REPAIR N/A 02/04/2013   Procedure: LAPAROSCOPIC ventral wall hernia repair LAPAROSCOPIC LYSIS OF ADHESIONS laparoscopic exploration of abdomen ;  Surgeon: Adin Hector, MD;  Location: [facility];  Service: General;  Laterality: N/A;  . UPPER GASTROINTESTINAL ENDOSCOPY    . URETER REVISION     Bilateral "twisted"  . UTERINE FIBROID SURGERY     2 SURGERIES FOR FIBROIDS  . VENTRAL HERNIA REPAIR  10/03/2011   Procedure: LAPAROSCOPIC VENTRAL HERNIA;  Surgeon: Adin Hector, MD;  Location: [facility];  Service: General;  Laterality: N/A;   Past Medical History:  Diagnosis Date  . Allergy   . Anemia    DURING MENSES--HAS HEAVY BLEEDING WITH PERODS  . Anxiety   . Back pain, chronic    "ongoing"  . Blood transfusion    IN 2012  AFTER C -SECTION    . Cerebral thrombosis with cerebral infarction [facility]) JUNE 2011   RIGHT SIDED WEAKNESS ( ARM AND LEG ) AND SPASMS-remains with slight weakness and vertigo.  . Constipation   . Depression   . Diabetes mellitus   . Diabetic neuropathy ([place])    BOTH FEET --COMES AND GOES  . Edema, lower extremity   . Endometriosis   . Fatty liver   . GERD (gastroesophageal reflux disease)    with pregnancy  . H/O eye  surgery   . Headache(784.0)    MIGRAINES--NOT REALLY HEADACHE-MORE LIKE PRESSURE SENSATION IN HEAD-FEELS DIZZIY AND  FAINT AS THE PRESSURE RESOLVES  . Hernia, incisional, RLQ, s/p lap repair Sep 2013 09/02/2011  . History of vertigo 03/22/2018  . Hx of migraines 10/19/2011  . Hypertension   . Leg pain, right    "like bad Charley horse"  . Multiple food allergies   . Panic disorder without agoraphobia   . Rash    HANDS, ARMS --STATES HX OF RASH EVER SINCE CHILDBIRTH/PREGNANCY.  STATES THE RASH OFTEN OCCURS WHEN SHE IS REALLY STRESSED."goes and comes-presently left ring finger"  . Restless leg syndrome    DIAGNOSED BY SLEEP STUDY - PT TOLD SHE DID NOT HAVE SLEEP APNEA  . Right rotator cuff tear    PAIN IN RIGHT SHOULDER  . SBO (small bowel obstruction) ([place]) 06/03/2012  . Shortness of breath   . Spastic hemiplegia affecting dominant side ([place])   . Stomach problems   . Stroke ([place])   . Ventral hernia    RIGHT LOWER QUADRANT-CAUSING SOME PAIN  . Weakness of right side of body    BP (!) 161/84   Pulse 90   Ht 5\' 6"  (1.676 m)   Wt 239 lb (108.4 kg)   SpO2 99%   BMI 38.58 kg/m   Opioid Risk Score:   Fall Risk Score:  `1  Depression screen PHQ 2/9  Depression screen [facility] 2/9 02/09/2019 11/18/2018 05/18/2018 05/13/2018 01/28/2018 01/01/2018 11/03/2017  Decreased Interest 0 0 3 1 1 1 2   Down, Depressed, Hopeless 1 1 2  0 0 1 3  PHQ - 2 Score 1 1 5 1 1 2 5   Altered sleeping - - 3 - - - 2  Tired, decreased energy - - 3 - - - 3  Change in appetite - - 3 - - - 3  Feeling bad or failure about  yourself  - - 3 - - - 3  Trouble concentrating - - 2 - - - 1  Moving slowly or fidgety/restless - - 0 - - - 0  Suicidal thoughts - - 0 - - - 1  PHQ-9 Score - - 19 - - - 18  Difficult doing work/chores - - - - - - Somewhat difficult  Some recent data might be hidden    Review of Systems  Constitutional: Negative.   HENT: Negative.   Eyes: Negative.   Respiratory: Negative.   Cardiovascular: Negative.   Gastrointestinal: Negative.   Endocrine: Negative.   Genitourinary: Negative.   Musculoskeletal: Positive for arthralgias and back pain.  Skin: Negative.   Allergic/Immunologic: Negative.   Neurological: Negative.   Hematological: Negative.   Psychiatric/Behavioral: Negative.   All other systems reviewed and are negative.      Objective:   Physical Exam Vitals and nursing note reviewed.  Constitutional:      Appearance: Normal appearance.  Eyes:     Extraocular Movements: Extraocular movements intact.     Conjunctiva/sclera: Conjunctivae normal.     Pupils: Pupils are equal, round, and reactive to light.  Musculoskeletal:     Comments: Tenderness palpation right PSIS area also tenderness of the right greater trochanter.  No pain with right hip range of motion.  Skin:    General: Skin is warm and dry.  Neurological:     Mental Status: She is alert and oriented to person, place, and time.     Cranial Nerves: No dysarthria or facial asymmetry.  Gait: Gait abnormal.     Deep Tendon Reflexes:     Reflex Scores:      Tricep reflexes are 3+ on the right side and 2+ on the left side.      Bicep reflexes are 4+ on the right side and 2+ on the left side.      Brachioradialis reflexes are 4+ on the right side and 2+ on the left side.      Patellar reflexes are 4+ on the right side and 2+ on the left side.      Achilles reflexes are 3+ on the right side and 2+ on the left side.  Negative Tinel's over the wrist There is decreased sensation to pinprick in the right second and  third digits but equal sensation to pinprick in the fifth digits bilaterally. There is no evidence of wasting in the APB on the right side.        Assessment & Plan:  #1.  History of right hemiparesis secondary to CVA, deficits are stable  2.  Right hand with median nerve distribution numbness and dropping objects, this may be related to carpal tunnel syndrome however she does have history of CVA with right hemiparesis, will check an EMG/NCV to further evaluate 3.  Right sacroiliac disorder stable doing well with exercises.  If she has a flareup may benefit from repeat right sacroiliac injection under fluoroscopic guidance.

## 2019-07-06 ENCOUNTER — Other Ambulatory Visit: Payer: Self-pay | Admitting: Physical Medicine & Rehabilitation

## 2019-07-08 LAB — TOXASSURE SELECT,+ANTIDEPR,UR

## 2019-07-09 ENCOUNTER — Other Ambulatory Visit: Payer: Self-pay | Admitting: Physical Medicine & Rehabilitation

## 2019-07-11 ENCOUNTER — Telehealth: Payer: Self-pay | Admitting: *Deleted

## 2019-07-11 NOTE — Telephone Encounter (Signed)
Urine drug screen is negative for tramadol. She reported last dose was the day of the test.  Her last fill was 04/09/19 per PMP so she could not have been taking regularly and may account for being absent from urine.

## 2019-07-20 ENCOUNTER — Encounter (INDEPENDENT_AMBULATORY_CARE_PROVIDER_SITE_OTHER): Payer: Self-pay | Admitting: Family Medicine

## 2019-07-20 ENCOUNTER — Other Ambulatory Visit: Payer: Self-pay

## 2019-07-20 ENCOUNTER — Ambulatory Visit (INDEPENDENT_AMBULATORY_CARE_PROVIDER_SITE_OTHER): Payer: Federal, State, Local not specified - PPO | Admitting: Family Medicine

## 2019-07-20 VITALS — BP 153/86 | HR 109 | Temp 98.2°F | Ht 66.0 in | Wt 235.0 lb

## 2019-07-20 DIAGNOSIS — Z9189 Other specified personal risk factors, not elsewhere classified: Secondary | ICD-10-CM

## 2019-07-20 DIAGNOSIS — G4709 Other insomnia: Secondary | ICD-10-CM | POA: Diagnosis not present

## 2019-07-20 DIAGNOSIS — E559 Vitamin D deficiency, unspecified: Secondary | ICD-10-CM | POA: Diagnosis not present

## 2019-07-20 DIAGNOSIS — Z6838 Body mass index (BMI) 38.0-38.9, adult: Secondary | ICD-10-CM

## 2019-07-20 DIAGNOSIS — F3289 Other specified depressive episodes: Secondary | ICD-10-CM

## 2019-07-20 MED ORDER — VITAMIN D (ERGOCALCIFEROL) 1.25 MG (50000 UNIT) PO CAPS: 50000.0000 [IU] | ORAL_CAPSULE | ORAL | 0 refills | Status: DC

## 2019-07-20 MED ORDER — BUPROPION HCL ER (SR) 150 MG PO TB12: 150.0000 mg | ORAL_TABLET | Freq: Every day | ORAL | 0 refills | Status: DC

## 2019-07-25 NOTE — Progress Notes (Signed)
Chief Complaint:   OBESITY Tina [name] is here to discuss her progress with her obesity treatment plan along with follow-up of her obesity related diagnoses. Tina [name] is on the Category 3 Plan and states she is following her eating plan approximately 40% of the time. Tina [name] states she is walking, doing yoga, and some weights for 15-30 minutes 7 times per week.  Today's visit was #: 57 Starting weight: 238 lbs Starting date: 11/03/2017 Today's weight: 235 lbs Today's date: 07/20/2019 Total lbs lost to date: 3 Total lbs lost since last in-office visit: 0  Interim History: Tina [name] has struggled to follow her plan as closely the last few weeks. She is not sleeping well and has a lot of stress in her life currently.  Subjective:   1. Other insomnia Tina [name] is with uncontrolled hypertension. She falls asleep easy in the daytime including with driving. She struggles to fall asleep and stay asleep.  2. Vitamin D deficiency Tina [name] is stable on Vit D, and she denies nausea or vomiting. She requests a refill today.  3. Other depression with emotional eating Tina [name] is stable on Wellbutrin and she requests a refill today. Her blood pressure is elevated today, but is not worse than normal. She notes increased stress and feels she would do well with starting therapy.  4. At risk for heart disease Tina [name] is at a higher than average risk for cardiovascular disease due to obesity.   Assessment/Plan:   1. Other insomnia The problem of recurrent insomnia was discussed. We will refer to [facility] for a sleep consultation. Orders and follow up as documented in patient record. Counseling: Intensive lifestyle modifications are the first line treatment for this issue. We discussed several lifestyle modifications today and Tina [name] will continue to work on diet, exercise and weight loss efforts.   - Ambulatory referral to Neurology  2. Vitamin D deficiency Low Vitamin D level  contributes to fatigue and are associated with obesity, breast, and colon cancer. We will refill prescription Vitamin D for 1 month. [place] will follow-up for routine testing of Vitamin D, at least 2-3 times per year to avoid over-replacement.  - Vitamin D, Ergocalciferol, (DRISDOL) 1.25 MG (50000 UNIT) CAPS capsule; Take 1 capsule (50,000 Units total) by mouth every 7 (seven) days.  Dispense: 4 capsule; Refill: 0  3. Other depression with emotional eating Behavior modification techniques were discussed today to help Tina [name] deal with her emotional/non-hunger eating behaviors. We will refer to [facility], and we will refill Wellbutrin SR for 1 month. Orders and follow up as documented in patient record.   - Ambulatory referral to Psychology - buPROPion (WELLBUTRIN SR) 150 MG 12 hr tablet; Take 1 tablet (150 mg total) by mouth daily.  Dispense: 30 tablet; Refill: 0  4. At risk for heart disease Tina [name] was given approximately 15 minutes of coronary artery disease prevention counseling today. She is 49 y.o. female and has risk factors for heart disease including obesity. We discussed intensive lifestyle modifications today with an emphasis on specific weight loss instructions and strategies.   Repetitive spaced learning was employed today to elicit superior memory formation and behavioral change.  5. Class 2 severe obesity with serious comorbidity and body mass index (BMI) of 38.0 to 38.9 in adult, unspecified obesity type ([place]) Tina [name] is currently in the action stage of change. As such, her goal is to continue with weight loss efforts. She has agreed to the Category 3 Plan.   Exercise goals: As is.  Behavioral modification strategies: meal planning and cooking strategies and emotional eating strategies.  Tina [name] has agreed to follow-up with our clinic in 3 weeks. She was informed of the importance of frequent follow-up visits to maximize her success with intensive lifestyle  modifications for her multiple health conditions.   Objective:   Blood pressure (!) 153/86, pulse (!) 109, temperature 98.2 F (36.8 C), temperature source Oral, height 5\' 6"  (1.676 m), weight 235 lb (106.6 kg), SpO2 100 %. Body mass index is 37.93 kg/m.  General: Cooperative, alert, well developed, in no acute distress. HEENT: Conjunctivae and lids unremarkable. Cardiovascular: Regular rhythm.  Lungs: Normal work of breathing. Neurologic: No focal deficits.   Lab Results  Component Value Date   CREATININE 1.09 (H) 04/12/2019   BUN 13 04/12/2019   NA 136 04/12/2019   K 5.0 04/12/2019   CL 98 04/12/2019   CO2 27 04/12/2019   Lab Results  Component Value Date   ALT 20 04/12/2019   AST 17 04/12/2019   ALKPHOS 93 04/12/2019   BILITOT <0.2 04/12/2019   Lab Results  Component Value Date   HGBA1C 10.1 (H) 04/12/2019   HGBA1C 8.4 12/08/2018   HGBA1C 8.5 (H) 10/20/2018   HGBA1C 8.5 (H) 02/15/2018   HGBA1C 8.4 (H) 11/03/2017   No results found for: INSULIN Lab Results  Component Value Date   TSH 1.100 04/12/2019   Lab Results  Component Value Date   CHOL 204 (A) 12/08/2018   HDL 48 12/08/2018   LDLCALC 91 12/08/2018   TRIG 323 (A) 12/08/2018   CHOLHDL 6.0 10/17/2011   Lab Results  Component Value Date   WBC 6.3 12/08/2018   HGB 11.3 (A) 12/08/2018   HCT 35 (A) 12/08/2018   MCV 80.6 04/20/2018   PLT 348 04/20/2018   Lab Results  Component Value Date   IRON 114 11/01/2015   TIBC 372 11/01/2015   FERRITIN 35 11/01/2015   Attestation Statements:   Reviewed by clinician on day of visit: allergies, medications, problem list, medical history, surgical history, family history, social history, and previous encounter notes.   I, Trixie Dredge, am acting as transcriptionist for Dennard Nip, MD.  I have reviewed the above documentation for accuracy and completeness, and I agree with the above. -  Dennard Nip, MD

## 2019-07-27 ENCOUNTER — Other Ambulatory Visit: Payer: Self-pay

## 2019-07-27 NOTE — Patient Outreach (Signed)
[facility]) Care Management Chronic Special Needs Program  07/27/2019  Name: Tina [name] DOB: September 10, 1970  MRN: [id number]  [name] is enrolled in a chronic special needs plan for Diabetes. Reviewed and updated care plan.  Subjective: Client states that her Hemoglobin A1C went up the last time it was checked since she had a gum infection and increased pain in her back and feet.  States her pain has improved and she is going to the pain clinic now.  States besides her pain medication she is doing stretches and leg exercises which help.  States she is using her Elenor Legato to scan her blood sugars 4-8 times a day and her readings range from 110-200.  States she has had 2 low readings in the last month when she did not eat as much as she had planned.  States she is still going to the [facility] weight management and is trying to follow the food plan they have given her.  States that her B/P has been improved since her pain is better and it is usually below 140/90.  States she has had 3-4 falls in the last few months with no injuries.  States she loses her balance or her leg gives out.  States she uses a cart when she goes out but does not always use her cane.  Client states that her provider told her she did not have heart failure.  Denies any chest pains or shortness of breath.  States she has had both of her COVID shots.  States that [facility] has been visiting her and she know to call them first for problems unless it is an Veterinary surgeon    Goals Addressed              This Visit's Progress   .  COMPLETED: Client understands the importance of follow-up with providers by attending scheduled visits   On track     Reports keeping appointments with provides Goal completed 07/27/19    .  Client will not report change from baseline and no repeated symptoms of stroke with in the next 6 months( continue 07/27/19)   On track     Reports no stroke symptoms Reviewed stoke  symptoms and when to call 911    .  Client will report improved coping of chronic pain by the next 6 months        Plan to continue to work with your pain management providers to manage your pain Reinforced to continue to do stretching and leg exercises as tolerated    .  Client will report no fall or injuries in the next 6 months.(continue 07/27/19)   Not on track     Reports 3 falls  Reinforced home safety and fall precautions Stand up slowly Use cane when unsteady Use grabber to pick up objects on the floor    .  COMPLETED: Client will use Assistive Devices as needed and verbalize understanding of device use   On track     Using [facility]  and B/P monitor without issues Goal completed 07/27/19    .  Client will verbalize knowledge of self management of Hypertension as evidences by BP reading of 140/90 or less; or as defined by provider   On track     Reports B/P less than 140/90 when checking at home Plan to check B/P regularly Take B/P medications as ordered Plan to follow a low salt diet  Increase activity as tolerated    .  Diabetes Patient stated goal to lose 10 lbs in the next 3 months( continue goal 02/08/18) (pt-stated)   Not on track     Plan to continue to follow low carbohydrate diet as advised from the [facility] Reviewed nutrition counseling benefit provided by [employer]. Will refer to [employer] Health Coach to assist with dietary management of diabetes     .  HEMOGLOBIN A1C < 7        Last Hemoglobin A1C  10% 04/12/19 Reviewed fasting blood sugar goals of 80-130 and less than 180 1 1/2-2 hours after meals Reinforced to follow a low carbohydrate low salt diet and to watch portion sizes Reviewed use and possible side effects of diabetes medications and insulin Reviewed signs and symptoms of hypoglycemia and actions to take    .  COMPLETED: Maintain timely refills of diabetic medication as prescribed within  the year .   On track     Maintaining timely refills of medications per dispense report Goal completed 07/27/19    .  COMPLETED: Obtain annual  Lipid Profile, LDL-C   On track     Completed 02/16/19 LDL 91 The goal for LDL is less than 70 mg/dL as you are at high risk for complications Try to avoid saturated fats, trans-fats and eat more fiber Plan to take statin as ordered  Goal completed 07/27/19    .  COMPLETED: Obtain Annual Eye (retinal)  Exam    On track     Completed 07/04/19 Plan to have a dilated eye exam every year Goal completed 07/27/19    .  COMPLETED: Obtain Annual Foot Exam   On track     Completed 05/24/19 Check your skin and feet every day for cuts, bruises, redness, blisters, or sores. Schedule a foot exam with your health care provider once every year. Goal completed 07/27/19    .  Obtain annual screen for micro albuminuria (urine) , nephropathy (kidney problems)   On track     It is important for your doctor to check your urine for protein at least every year    .  Obtain Hemoglobin A1C at least 2 times per year   On track     Completed 04/12/19 It is important to have your Hemoglobin A1C checked every 6 months if you are at goal and every 3 months if you are not at goal    .  Visit Primary Care Provider or Endocrinologist at least 2 times per year    On track     Plan to keep scheduled appointments with providers and [facility] care provider 02/22/19, 07/01/19 [facility] provider 05/09/19 next scheduled visit 09/13/19 Endocrinologist 06/09/19  Please schedule your annual wellness visit Please call [facility] as needed [phone]      Client is now actively engaged with [facility] for complex management.    Reviewed [facility]'s plan of care for client.  Last seen by [facility] provider on 05/09/19. [facility] is providing education on DM self management and encourages communication with them.  Next [facility] visit scheduled for 09/13/19  Plan:  Send successful outreach  letter with a copy of their individualized care plan, Send individual care plan to provider   Chronic care management coordinator will review [facility]'s plan of care and will collaborate with [facility] team as indicated in 6 Months  Client will be outreached by a [employer] RNCM in 6 months per tier level  Will refer to:  [employer] Health Coach   Peter Garter RN, [facility], [facility] Care Management [phone]

## 2019-07-28 ENCOUNTER — Other Ambulatory Visit: Payer: Self-pay | Admitting: Physical Medicine & Rehabilitation

## 2019-08-02 DIAGNOSIS — E118 Type 2 diabetes mellitus with unspecified complications: Secondary | ICD-10-CM | POA: Diagnosis not present

## 2019-08-02 LAB — BASIC METABOLIC PANEL
BUN: 15 (ref 4–21)
CO2: 26 — AB (ref 13–22)
Chloride: 101 (ref 99–108)
Creatinine: 1.1 (ref 0.5–1.1)
Potassium: 4.7 (ref 3.4–5.3)
Sodium: 139 (ref 137–147)

## 2019-08-02 LAB — MICROALBUMIN, URINE: Microalb, Ur: 1027.2

## 2019-08-02 LAB — LIPID PANEL
Cholesterol: 191 (ref 0–200)
HDL: 54 (ref 35–70)
LDL Cholesterol: 102
LDl/HDL Ratio: 1.9
Triglycerides: 207 — AB (ref 40–160)

## 2019-08-02 LAB — COMPREHENSIVE METABOLIC PANEL
Albumin: 3.3 — AB (ref 3.5–5.0)
Calcium: 8.7 (ref 8.7–10.7)
GFR calc Af Amer: 68
GFR calc non Af Amer: 59
Globulin: 2.6

## 2019-08-02 LAB — HEPATIC FUNCTION PANEL
ALT: 11 (ref 7–35)
AST: 14 (ref 13–35)
Alkaline Phosphatase: 79 (ref 25–125)
Bilirubin, Total: 0.2

## 2019-08-02 LAB — HEMOGLOBIN A1C: Hemoglobin A1C: 8.4

## 2019-08-04 ENCOUNTER — Encounter
Payer: Federal, State, Local not specified - PPO | Attending: Physical Medicine & Rehabilitation | Admitting: Physical Medicine & Rehabilitation

## 2019-08-04 ENCOUNTER — Ambulatory Visit: Payer: Federal, State, Local not specified - PPO

## 2019-08-04 ENCOUNTER — Encounter: Payer: Self-pay | Admitting: Physical Medicine & Rehabilitation

## 2019-08-04 ENCOUNTER — Other Ambulatory Visit: Payer: Self-pay

## 2019-08-04 ENCOUNTER — Other Ambulatory Visit: Payer: Self-pay | Admitting: Physical Medicine & Rehabilitation

## 2019-08-04 VITALS — BP 143/78 | HR 92 | Temp 98.8°F | Ht 66.5 in | Wt 244.0 lb

## 2019-08-04 DIAGNOSIS — Z79891 Long term (current) use of opiate analgesic: Secondary | ICD-10-CM | POA: Insufficient documentation

## 2019-08-04 DIAGNOSIS — Z5181 Encounter for therapeutic drug level monitoring: Secondary | ICD-10-CM | POA: Insufficient documentation

## 2019-08-04 DIAGNOSIS — G894 Chronic pain syndrome: Secondary | ICD-10-CM | POA: Insufficient documentation

## 2019-08-04 DIAGNOSIS — R29898 Other symptoms and signs involving the musculoskeletal system: Secondary | ICD-10-CM

## 2019-08-04 NOTE — Progress Notes (Signed)
Chief complaint: 49 year old female with poorly controlled diabetes as well as history of CVA x2 affecting the right side with primary complaint of  dropping objects with the right hand.  She does not notice any new numbness on the right side.  The patient does not feel weaker in the hands she just feels like sometimes objects slipped out of her hand.  No neck pain no radiating pain down into the hand.  No wrist pain.  No new weakness in the lower extremity  EMG performed to evaluate for RUE weakness  Right ulnar sensory response absent without other abnormalities Finding most consistent with underlying Diabetes, with prior diagnosis of neuropathy affecting lower extremities See scanned report under media

## 2019-08-04 NOTE — Patient Instructions (Signed)
THe right hand symptoms are likely related to diabetic neuropathy.  Try to work with your other MDs to keep diabetes under better control to avoid progression

## 2019-08-09 DIAGNOSIS — Z6838 Body mass index (BMI) 38.0-38.9, adult: Secondary | ICD-10-CM | POA: Diagnosis not present

## 2019-08-09 DIAGNOSIS — E1140 Type 2 diabetes mellitus with diabetic neuropathy, unspecified: Secondary | ICD-10-CM | POA: Diagnosis not present

## 2019-08-09 DIAGNOSIS — E1142 Type 2 diabetes mellitus with diabetic polyneuropathy: Secondary | ICD-10-CM | POA: Diagnosis not present

## 2019-08-09 DIAGNOSIS — E118 Type 2 diabetes mellitus with unspecified complications: Secondary | ICD-10-CM | POA: Diagnosis not present

## 2019-08-09 DIAGNOSIS — E7800 Pure hypercholesterolemia, unspecified: Secondary | ICD-10-CM | POA: Diagnosis not present

## 2019-08-09 DIAGNOSIS — E559 Vitamin D deficiency, unspecified: Secondary | ICD-10-CM | POA: Diagnosis not present

## 2019-08-09 DIAGNOSIS — I10 Essential (primary) hypertension: Secondary | ICD-10-CM | POA: Diagnosis not present

## 2019-08-09 DIAGNOSIS — I639 Cerebral infarction, unspecified: Secondary | ICD-10-CM | POA: Diagnosis not present

## 2019-08-10 ENCOUNTER — Other Ambulatory Visit: Payer: Self-pay

## 2019-08-10 ENCOUNTER — Encounter (INDEPENDENT_AMBULATORY_CARE_PROVIDER_SITE_OTHER): Payer: Self-pay | Admitting: Family Medicine

## 2019-08-10 ENCOUNTER — Ambulatory Visit (INDEPENDENT_AMBULATORY_CARE_PROVIDER_SITE_OTHER): Payer: Federal, State, Local not specified - PPO | Admitting: Family Medicine

## 2019-08-10 VITALS — BP 132/73 | HR 89 | Temp 98.5°F | Ht 66.0 in | Wt 231.0 lb

## 2019-08-10 DIAGNOSIS — Z6837 Body mass index (BMI) 37.0-37.9, adult: Secondary | ICD-10-CM | POA: Diagnosis not present

## 2019-08-10 DIAGNOSIS — E559 Vitamin D deficiency, unspecified: Secondary | ICD-10-CM

## 2019-08-10 DIAGNOSIS — F3289 Other specified depressive episodes: Secondary | ICD-10-CM | POA: Diagnosis not present

## 2019-08-10 MED ORDER — VITAMIN D (ERGOCALCIFEROL) 1.25 MG (50000 UNIT) PO CAPS: 50000.0000 [IU] | ORAL_CAPSULE | ORAL | 0 refills | Status: DC

## 2019-08-10 MED ORDER — BUPROPION HCL ER (SR) 150 MG PO TB12: 150.0000 mg | ORAL_TABLET | Freq: Every day | ORAL | 0 refills | Status: DC

## 2019-08-10 NOTE — Progress Notes (Signed)
Chief Complaint:   OBESITY Tina [name] is here to discuss her progress with her obesity treatment plan along with follow-up of her obesity related diagnoses. Tina [name] is on the Category 3 Plan and states she is following her eating plan approximately 70% of the time. Tina [name] states she is walking for 15-20 minutes 7 times per week.  Today's visit was #: 57 Starting weight: 238 lbs Starting date: 11/03/2017 Today's weight: 231 lbs Today's date: 08/10/2019 Total lbs lost to date: 7 Total lbs lost since last in-office visit: 4  Interim History: Tina [name] continues to do well with weight loss on her Category 3 plan. She is going on vacation soon and is worried about holiday weight gain.  Subjective:   1. Vitamin D deficiency Tina [name] is stable on Vit D, but her level is not yet at goal. She denies nausea or vomiting.  2. Other depression with emotional eating Tina [name] is stable on her medications, and she has done well decreasing emotional eating and comfort eating.  Assessment/Plan:   1. Vitamin D deficiency Low Vitamin D level contributes to fatigue and are associated with obesity, breast, and colon cancer. We will refill prescription Vitamin D for 1 month. Tina [name] will follow-up for routine testing of Vitamin D, at least 2-3 times per year to avoid over-replacement.  - Vitamin D, Ergocalciferol, (DRISDOL) 1.25 MG (50000 UNIT) CAPS capsule; Take 1 capsule (50,000 Units total) by mouth every 7 (seven) days.  Dispense: 4 capsule; Refill: 0  2. Other depression with emotional eating Behavior modification techniques were discussed today to help Tina [name] deal with her emotional/non-hunger eating behaviors. We will refill Wellbutrin SR for 1 month. Orders and follow up as documented in patient record.   - buPROPion (WELLBUTRIN SR) 150 MG 12 hr tablet; Take 1 tablet (150 mg total) by mouth daily.  Dispense: 30 tablet; Refill: 0  3. Class 2 severe obesity with serious comorbidity and body mass  index (BMI) of 37.0 to 37.9 in adult, unspecified obesity type (Tina [name]) Tina [name] is currently in the action stage of change. As such, her goal is to continue with weight loss efforts. She has agreed to the Category 3 Plan.   Exercise goals: As is.  Behavioral modification strategies: increasing lean protein intake, increasing water intake, travel eating strategies, holiday eating strategies  and celebration eating strategies.  Tina [name] has agreed to follow-up with our clinic in 3 weeks. She was informed of the importance of frequent follow-up visits to maximize her success with intensive lifestyle modifications for her multiple health conditions.   Objective:   Blood pressure (!) 132/73, pulse 89, temperature 98.5 F (36.9 C), temperature source Oral, height 5\' 6"  (1.676 m), weight (!) 231 lb (104.8 kg), SpO2 97 %. Body mass index is 37.28 kg/m.  General: Cooperative, alert, well developed, in no acute distress. HEENT: Conjunctivae and lids unremarkable. Cardiovascular: Regular rhythm.  Lungs: Normal work of breathing. Neurologic: No focal deficits.   Lab Results  Component Value Date   CREATININE 1.09 (H) 04/12/2019   BUN 13 04/12/2019   NA 136 04/12/2019   K 5.0 04/12/2019   CL 98 04/12/2019   CO2 27 04/12/2019   Lab Results  Component Value Date   ALT 20 04/12/2019   AST 17 04/12/2019   ALKPHOS 93 04/12/2019   BILITOT <0.2 04/12/2019   Lab Results  Component Value Date   HGBA1C 10.1 (H) 04/12/2019   HGBA1C 8.4 12/08/2018   HGBA1C 8.5 (H) 10/20/2018   HGBA1C 8.5 (H)  02/15/2018   HGBA1C 8.4 (H) 11/03/2017   No results found for: INSULIN Lab Results  Component Value Date   TSH 1.100 04/12/2019   Lab Results  Component Value Date   CHOL 204 (A) 12/08/2018   HDL 48 12/08/2018   LDLCALC 91 12/08/2018   TRIG 323 (A) 12/08/2018   CHOLHDL 6.0 10/17/2011   Lab Results  Component Value Date   WBC 6.3 12/08/2018   HGB 11.3 (A) 12/08/2018   HCT 35 (A) 12/08/2018   MCV  80.6 04/20/2018   PLT 348 04/20/2018   Lab Results  Component Value Date   IRON 114 11/01/2015   TIBC 372 11/01/2015   FERRITIN 35 11/01/2015    Obesity Behavioral Intervention Documentation for Insurance:   Approximately 15 minutes were spent on the discussion below.  ASK: We discussed the diagnosis of obesity with Tina [name] today and Tina [name] agreed to give [name] permission to discuss obesity behavioral modification therapy today.  ASSESS: Tina [name] has the diagnosis of obesity and her BMI today is 37.3. Tina [name] is in the action stage of change.   ADVISE: Tina [name] was educated on the multiple health risks of obesity as well as the benefit of weight loss to improve her health. She was advised of the need for long term treatment and the importance of lifestyle modifications to improve her current health and to decrease her risk of future health problems.  AGREE: Multiple dietary modification options and treatment options were discussed and Tina [name] agreed to follow the recommendations documented in the above note.  ARRANGE: Tina [name] was educated on the importance of frequent visits to treat obesity as outlined per CMS and USPSTF guidelines and agreed to schedule her next follow up appointment today.  Attestation Statements:   Reviewed by clinician on day of visit: allergies, medications, problem list, medical history, surgical history, family history, social history, and previous encounter notes.   I, Trixie Dredge, am acting as transcriptionist for Dennard Nip, MD.  I have reviewed the above documentation for accuracy and completeness, and I agree with the above. -  Dennard Nip, MD

## 2019-08-11 ENCOUNTER — Encounter (INDEPENDENT_AMBULATORY_CARE_PROVIDER_SITE_OTHER): Payer: Self-pay

## 2019-08-13 ENCOUNTER — Other Ambulatory Visit: Payer: Self-pay | Admitting: Physical Medicine & Rehabilitation

## 2019-08-16 ENCOUNTER — Encounter (INDEPENDENT_AMBULATORY_CARE_PROVIDER_SITE_OTHER): Payer: Self-pay

## 2019-08-30 ENCOUNTER — Ambulatory Visit (INDEPENDENT_AMBULATORY_CARE_PROVIDER_SITE_OTHER): Payer: HMO | Admitting: Adult Health

## 2019-08-31 ENCOUNTER — Ambulatory Visit: Payer: Federal, State, Local not specified - PPO | Admitting: Psychology

## 2019-09-01 DIAGNOSIS — Z03818 Encounter for observation for suspected exposure to other biological agents ruled out: Secondary | ICD-10-CM | POA: Diagnosis not present

## 2019-09-01 DIAGNOSIS — Z20822 Contact with and (suspected) exposure to covid-19: Secondary | ICD-10-CM | POA: Diagnosis not present

## 2019-09-08 ENCOUNTER — Ambulatory Visit: Payer: Federal, State, Local not specified - PPO | Admitting: Sports Medicine

## 2019-09-12 ENCOUNTER — Encounter: Payer: Self-pay | Admitting: Neurology

## 2019-09-12 ENCOUNTER — Ambulatory Visit (INDEPENDENT_AMBULATORY_CARE_PROVIDER_SITE_OTHER): Payer: Federal, State, Local not specified - PPO | Admitting: Neurology

## 2019-09-12 ENCOUNTER — Other Ambulatory Visit: Payer: Self-pay

## 2019-09-12 VITALS — BP 158/92 | HR 92 | Ht 66.0 in | Wt 241.0 lb

## 2019-09-12 DIAGNOSIS — R442 Other hallucinations: Secondary | ICD-10-CM

## 2019-09-12 DIAGNOSIS — G4719 Other hypersomnia: Secondary | ICD-10-CM | POA: Diagnosis not present

## 2019-09-12 DIAGNOSIS — F518 Other sleep disorders not due to a substance or known physiological condition: Secondary | ICD-10-CM | POA: Diagnosis not present

## 2019-09-12 DIAGNOSIS — G478 Other sleep disorders: Secondary | ICD-10-CM

## 2019-09-12 NOTE — Patient Instructions (Signed)
Hypersomnia Hypersomnia is a condition in which a person feels very tired during the day even though he or she gets plenty of sleep at night. A person with this condition may take naps during the day and may find it very difficult to wake up from sleep. Hypersomnia may affect a person's ability to think, concentrate, drive, or remember things. What are the causes? The cause of this condition may not be known. Possible causes include:  Certain medicines.  Sleep disorders, such as narcolepsy and sleep apnea.  Injury to the head, brain, or spinal cord.  Drug or alcohol use.  Gastroesophageal reflux disease (GERD).  Tumors.  Certain medical conditions, such as depression, diabetes, or an underactive thyroid gland (hypothyroidism). What are the signs or symptoms? The main symptoms of hypersomnia include:  Feeling very tired throughout the day, regardless of how much sleep you got the night before.  Having trouble waking up. Others may find it difficult to wake you up when you are sleeping.  Sleeping for longer and longer periods at a time.  Taking naps throughout the day. Other symptoms may include:  Feeling restless, anxious, or annoyed.  Lacking energy.  Having trouble with: ? Remembering. ? Speaking. ? Thinking.  Loss of appetite.  Seeing, hearing, tasting, smelling, or feeling things that are not real (hallucinations). How is this diagnosed? This condition may be diagnosed based on:  Your symptoms and medical history.  Your sleeping habits. Your health care provider may ask you to write down your sleeping habits in a daily sleep log, along with any symptoms you have.  A series of tests that are done while you sleep (sleep study or polysomnogram).  A test that measures how quickly you can fall asleep during the day (daytime nap study or multiple sleep latency test). How is this treated? Treatment can help you manage your condition. Treatment may  include:  Following a regular sleep routine.  Lifestyle changes, such as changing your eating habits, getting regular exercise, and avoiding alcohol or caffeinated beverages.  Taking medicines to make you more alert (stimulants) during the day.  Treating any underlying medical causes of hypersomnia. Follow these instructions at home: Sleep routine   Schedule the same bedtime and wake-up time each day.  Practice a relaxing bedtime routine. This may include reading, meditation, deep breathing, or taking a warm bath before going to sleep.  Get regular exercise each day. Avoid strenuous exercise in the evening hours.  Keep your sleep environment at a cooler temperature, darkened, and quiet.  Sleep with pillows and a mattress that are comfortable and supportive.  Schedule short 20-minute naps for when you feel sleepiest during the day.  Talk with your employer or teachers about your hypersomnia. If possible, adjust your schedule so that: ? You have a regular daytime work schedule. ? You can take a scheduled nap during the day. ? You do not have to work or be active at night.  Do not eat a heavy meal for a few hours before bedtime. Eat your meals at about the same times every day.  Avoid drinking alcohol or caffeinated beverages. Safety   Do not drive or use heavy machinery if you are sleepy. Ask your health care provider if it is safe for you to drive.  Wear a life jacket when swimming or spending time near water. General instructions  Take supplements and over-the-counter and prescription medicines only as told by your health care provider.  Keep a sleep log that will help   your doctor manage your condition. This may include information about: ? What time you go to bed each night. ? How often you wake up at night. ? How many hours you sleep at night. ? How often and for how long you nap during the day. ? Any observations from others, such as leg movements during sleep,  sleep walking, or snoring.  Keep all follow-up visits as told by your health care provider. This is important. Contact a health care provider if:  You have new symptoms.  Your symptoms get worse. Get help right away if:  You have serious thoughts about hurting yourself or someone else. If you ever feel like you may hurt yourself or others, or have thoughts about taking your own life, get help right away. You can go to your nearest emergency department or call:  Your local emergency services (911 in the [place]  A suicide crisis helpline, such as the [place] at 601-138-0807. This is open 24 hours a day. Summary  Hypersomnia refers to a condition in which you feel very tired during the day even though you get plenty of sleep at night.  A person with this condition may take naps during the day and may find it very difficult to wake up from sleep.  Hypersomnia may affect a person's ability to think, concentrate, drive, or remember things.  Treatment, such as following a regular sleep routine and making some lifestyle changes, can help you manage your condition. This information is not intended to replace advice given to you by your health care provider. Make sure you discuss any questions you have with your health care provider. Document Revised: 01/01/2017 Document Reviewed: 01/01/2017 Elsevier Patient Education  [place]. Narcolepsy Narcolepsy is a neurological disorder that causes people to fall asleep suddenly and without control (have sleep attacks) during the daytime. It is a lifelong disorder. Narcolepsy disrupts the sleep cycle at night, which then causes daytime sleepiness. What are the causes? The cause of narcolepsy is not fully understood, but it may be related to:  Low levels of hypocretin, a chemical (neurotransmitter) in the brain that controls sleep and wake cycles. Hypocretin imbalance may be caused by: ? Abnormal genes that are  passed from parent to child (inherited). ? An autoimmune disease in which the body's defense system (immune system) attacks the brain cells that make hypocretin.  Infection, tumor, or injury in the area of the brain that controls sleep.  Exposure to poisons (toxins), such as heavy metals, pesticides, and secondhand smoke. What are the signs or symptoms? Symptoms of this condition include:  Excessive daytime sleepiness. This is the most common symptom and is usually the first symptom you will notice. This may affect your performance at work or school.  Sleep attacks. You may fall asleep in the middle of an activity, especially low-energy activities like reading or watching TV.  Feeling like you cannot think clearly and trouble focusing or remembering things. You may also feel depressed.  Sudden muscle weakness (cataplexy). When this occurs, your speech may become slurred, or your knees may buckle. Cataplexy is usually triggered by surprise, anger, fear, or laughter.  Losing the ability to speak or move (sleep paralysis). This may occur just as you start to fall asleep or wake up. You will be aware of the paralysis. It usually lasts for just a few seconds or minutes.  Seeing, hearing, tasting, smelling, or feeling things that are not real (hallucinations). Hallucinations may occur with sleep paralysis.  They can happen when you are falling asleep, waking up, or dozing.  Trouble staying asleep at night (insomnia) and restless sleep. How is this diagnosed? This condition may be diagnosed based on:  A physical exam to rule out any other problems that may be causing your symptoms. You may be asked to write down your sleeping patterns for several weeks in a sleep diary. This will help your health care provider make a diagnosis.  Sleep studies that measure how well your REM sleep is regulated. These tests also measure your heart rate, breathing, movement, and brain waves. These tests include: ? An  overnight sleep study (polysomnogram). ? A daytime sleep study that is done while you take several naps during the day (multiple sleep latency test, MSLT). This test measures how quickly you fall asleep and how quickly you enter REM sleep.  Removal of spinal fluid to measure hypocretin levels. How is this treated? There is no cure for this condition, but treatment can help relieve symptoms. Treatment may include:  Lifestyle and sleeping strategies to help you cope with the condition, such as: ? Exercising regularly. ? Maintaining a regular sleep schedule. ? Avoiding caffeine and large meals before bed.  Medicines. These may include: ? Medicines that help keep you awake and alert (stimulants) to fight daytime sleepiness. ? Medicines that treat depression (antidepressants). These may be used to treat cataplexy. ? Sodium oxybate. This is a strong medicine to help you relax (sedative) that you may take at night. It can help control daytime sleepiness and cataplexy.  Other treatments may include mental health counseling or joining a support group. Follow these instructions at home: Sleeping habits   Get about 8 hours of sleep every night.  Go to sleep and get up at about the same time every day.  Keep your bedroom dark, quiet, and comfortable.  When you feel very tired, take short naps. Schedule naps so that you take them at about the same time every day.  Before bedtime: ? Avoid bright lights and screens. ? Relax. Try activities like reading or taking a warm bath. Activity  Get at least 20 minutes of exercise every day. This will help you sleep better at night and reduce daytime sleepiness.  Avoid exercising within 3 hours of bedtime.  Do not drive or use heavy machinery if you are sleepy. If possible, take a nap before driving.  Do not swim or go out on the water without a life jacket. Eating and drinking  Do not drink alcohol or caffeinated beverages within 4-5 hours of  bedtime.  Do not eat a large meal before bedtime.  Eat meals at about the same times every day. General instructions   Take over-the-counter and prescription medicines only as told by your health care provider.  Keep a sleep diary as told by your health care provider.  Tell your employer or teachers that you have narcolepsy. You may be able to adjust your schedule to include time for naps.  Do not use any products that contain nicotine or tobacco, such as cigarettes, e-cigarettes, and chewing tobacco. If you need help quitting, ask your health care provider.  Keep all follow-up visits as told by your health care provider. This is important. Where to find more information  [employer] of Neurological Disorders: [employer] Contact a health care provider if:  Your symptoms are not getting better.  You have increasingly high blood pressure (hypertension).  You have changes in your heart rhythm.  You are having  a hard time determining what is real and what is not (psychosis). Get help right away if you:  Hurt yourself during a sleep attack or an attack of cataplexy.  Have chest pain.  Have trouble breathing. These symptoms may represent a serious problem that is an emergency. Do not wait to see if the symptoms will go away. Get medical help right away. Call your local emergency services (911 in the [place] Do not drive yourself to the hospital. Summary  Narcolepsy is a neurological disorder that causes people to fall asleep suddenly, and without control, during the daytime (sleep attacks). It is a lifelong disorder.  There is no cure for this condition, but treatment can help relieve symptoms.  Go to sleep and get up at about the same time every day. Follow instructions about sleep and activities as told by your health care provider.  Take over-the-counter and prescription medicines only as told by your health care provider. This information is not intended to  replace advice given to you by your health care provider. Make sure you discuss any questions you have with your health care provider. Document Revised: 08/11/2018 Document Reviewed: 08/11/2018 Elsevier Patient Education  [place].

## 2019-09-12 NOTE — Progress Notes (Signed)
SLEEP MEDICINE CLINIC    Provider:  Larey Seat, MD  Primary Care Physician:  [name], [name] [place] [address]     Referring Provider: [name], [address] [place] [place],  [place] [address]          Chief Complaint according to patient   Patient presents with:    . New Patient (Initial Visit)           HISTORY OF PRESENT ILLNESS:  Tina [name] is a 49 y.o. year old 9 or [place] American female patient seen here upon a consultation requested by medical weight and wellness , [name], MD , on 09/12/2019 . Chief concern according to patient : " I am snacking at night and can't lose weight" - "I am not a sleep walker, but I have vivid dreams , lucid dreams. "   I have the pleasure of seeing [name] on 09-12-2019 again, she was last seen by Dr [name] ,MD, on 02-11-2016, and  has a past medical history of Allergy, Anemia, Anxiety, Back pain, chronic, Blood transfusion, Cerebral thrombosis with cerebral infarction [facility]) (JUNE 2011), Constipation, Depression, Diabetes mellitus, Diabetic neuropathy ([place]), Edema, lower extremity, Endometriosis, Fatty liver, GERD (gastroesophageal reflux disease), H/O eye surgery, Headache(784.0), Hernia, incisional, RLQ, s/p lap repair Sep 2013 (09/02/2011), History of vertigo (03/22/2018), migraines (10/19/2011), Hypertension, Leg pain, right, Multiple food allergies, Panic disorder without agoraphobia, Rash, Restless leg syndrome, Right rotator cuff tear, SBO (small bowel obstruction) ([place]) (06/03/2012), Shortness of breath, Spastic hemiplegia affecting dominant side ([place]),  Ventral hernia.  The patient had the first sleep study in the year 2017, the patient was at the time referred from Dr. [name] of [facility], nurse practitioner to be evaluated for excessive daytime fatigue and sleepiness.  She had a previous sleep study in October 23, 2009 at the time she was clinically  depressed following her stroke.  She had main sleep interruptions from kicking and twitching at night.  She endorsed a fatigue score at 63 out of 63 points.  Her AHI index in 2017 was 3.0 she did have frequent periodic limb movements but there was only a moderate degree of arousals from these.   She did not have hypoxemia, there was no reason to initiate CPAP.  We discussed weight loss strategies and to avoid caffeine-containing beverages and chocolate and to review medications and substance use for anything that aggravates restless legs or periodic limb movements at night.  Dr. [name] followed up with the patient the last time in 2018 and he saw her for stroke along the long-term effect tension type headaches she had no significant bruising or bleeding but stayed on a baby aspirin a day.  Hemoglobin A1c at the time was elevated at 8.7.  She had gouty arthritis giving her significant discomfort.  At the time she did not have strokelike symptoms.  She had a left pontine infarct back in June 2011.  Healthy weight and wellness Dr. [name] saw her on 20 July 2019 stated that the patient had uncontrolled hypertension yet falls asleep in daytime including while driving.  This degree of hypersomnia needs to be evaluated she has been on vitamin D supplementation after a deficiency was found she is treated for depression with emotional eating and nocturnal eating.  She would consider herself a stress eater.   She snores. No nocturia.  She is at risk for heart disease due to her diabetes and  hypertension history her BMI was 38 her blood pressure was 153/86 during that visit, creatinine was 1.09 triglycerides were highly elevated 323, hemoglobin A1c as of March was 10.1 hemoglobin and hematocrit were 11.3 and 35 ferritin was low at 35.      Family medical /sleep history: No other family member on CPAP with OSA, mother is suffering from  insomnia, no sleep walkers.    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 She lives in a household with husband, 3 sons an her nephew ( 36).  The patient currently works in the [employer] . Pets are present. One dog.  Tobacco use- none .   ETOH use; none ,  Caffeine intake in form of Coffee( 4/ week) Soda( */ Tea (/) or energy drinks. Regular exercise in form of Yoga , walking      Sleep habits are as follows: The patient's dinner time is between 6.30 PM. The patient goes to bed at variable times- she doses off on the living room and transfer to bed at 3 AM and continues to sleep for 2.5  hours,wakes  At 3AM each night- The preferred sleep position is while on her couch is supine and in her bed she may sleep prone or on her side, with the support of one pillow. Dreams are reportedly frequent/vividly.  5.30  AM is the usual rise time. The patient wakes up spontaneously.  She reports not feeling refreshed or restored in AM, with symptoms such as dry mouth,  and residual fatigue.  There are no morning headaches unless she sleeps in which does not happen on workdays of course.  She does crave naps and she struggles with daytime sleepiness but if she does not have the external timetable of work to follow she may stay in bed and nap on and off for many hours.   Review of Systems: Out of a complete 14 system review, the patient complains of only the following symptoms, and all other reviewed systems are negative.:  Fatigue, sleepiness , snoring, fragmented sleep, Insomnia , poor sleep and an irresistible urge to go to sleep even while in church even while in the midst of other activities operating machinery.  Sleep, and poorly controlled diabetes and HTN-    How likely are you to doze in the following situations: 0 = not likely, 1 = slight chance, 2 = moderate chance, 3 = high chance   Sitting and Reading? Watching Television? Sitting inactive in a public place (theater or meeting)? As a passenger in a car for an hour  without a break? Lying down in the afternoon when circumstances permit? Sitting and talking to someone? Sitting quietly after lunch without alcohol? In a car, while stopped for a few minutes in traffic?   Total = 19/ 24 points   FSS endorsed at i33/ 63 points.   Social History   Socioeconomic History  . Marital status: Married    Spouse name: Annie Main  . Number of children: 3  . Years of education: BA degree  . Highest education level: Not on file  Occupational History  . Occupation: stay at home mom    Employer: UNEMPLOYED  Tobacco Use  . Smoking status: Never Smoker  . Smokeless tobacco: Never Used  Vaping Use  . Vaping Use: Never used  Substance and Sexual Activity  . Alcohol use: No    Alcohol/week: 0.0 standard drinks  . Drug use: No  .  Sexual activity: Yes    Birth control/protection: None  Other Topics Concern  . Not on file  Social History Narrative   Patient is married with 2 children.   Patient is right handed.   Patient has her  BA degree.   Patient drinks 1 cup daily.   Social Determinants of Health   Financial Resource Strain:   . Difficulty of Paying Living Expenses: Not on file  Food Insecurity: No Food Insecurity  . Worried About Charity fundraiser in the Last Year: Never true  . Ran Out of Food in the Last Year: Never true  Transportation Needs: No Transportation Needs  . Lack of Transportation (Medical): No  . Lack of Transportation (Non-Medical): No  Physical Activity:   . Days of Exercise per Week: Not on file  . Minutes of Exercise per Session: Not on file  Stress:   . Feeling of Stress : Not on file  Social Connections:   . Frequency of Communication with Friends and Family: Not on file  . Frequency of Social Gatherings with Friends and Family: Not on file  . Attends Religious Services: Not on file  . Active Member of Clubs or Organizations: Not on file  . Attends Archivist Meetings: Not on file  . Marital Status: Not on  file    Family History  Problem Relation Age of Onset  . Diabetes Father   . Kidney disease Father   . Depression Father   . Drug abuse Father   . Allergic rhinitis Mother   . Eczema Mother   . Urticaria Mother   . Depression Mother   . Anxiety disorder Mother   . Bipolar disorder Mother   . Alcoholism Mother   . Drug abuse Mother   . Eating disorder Mother   . Diabetes Maternal Grandmother   . Hyperlipidemia Paternal Grandmother   . Stroke Paternal Grandmother   . Eczema Sister   . Urticaria Sister   . Colon cancer Paternal Uncle   . Other Neg Hx   . Angioedema Neg Hx   . Asthma Neg Hx   . Colon polyps Neg Hx   . Esophageal cancer Neg Hx   . Rectal cancer Neg Hx   . Stomach cancer Neg Hx     Past Medical History:  Diagnosis Date  . Allergy   . Anemia    DURING MENSES--HAS HEAVY BLEEDING WITH PERODS  . Anxiety   . Back pain, chronic    "ongoing"  . Blood transfusion    IN 2012  AFTER C -SECTION  . Cerebral thrombosis with cerebral infarction [facility]) JUNE 2011   RIGHT SIDED WEAKNESS ( ARM AND LEG ) AND SPASMS-remains with slight weakness and vertigo.  . Constipation   . Depression   . Diabetes mellitus   . Diabetic neuropathy ([place])    BOTH FEET --COMES AND GOES  . Edema, lower extremity   . Endometriosis   . Fatty liver   . GERD (gastroesophageal reflux disease)    with pregnancy  . H/O eye surgery   . Headache(784.0)    MIGRAINES--NOT REALLY HEADACHE-MORE LIKE PRESSURE SENSATION IN HEAD-FEELS DIZZIY AND  FAINT AS THE PRESSURE RESOLVES  . Hernia, incisional, RLQ, s/p lap repair Sep 2013 09/02/2011  . History of vertigo 03/22/2018  . Hx of migraines 10/19/2011  . Hypertension   . Leg pain, right    "like bad Charley horse"  . Multiple food allergies   . Panic disorder without agoraphobia   .  Rash    HANDS, ARMS --STATES HX OF RASH EVER SINCE CHILDBIRTH/PREGNANCY.  STATES THE RASH OFTEN OCCURS WHEN SHE IS REALLY STRESSED."goes and comes-presently left ring  finger"  . Restless leg syndrome    DIAGNOSED BY SLEEP STUDY - PT TOLD SHE DID NOT HAVE SLEEP APNEA  . Right rotator cuff tear    PAIN IN RIGHT SHOULDER  . SBO (small bowel obstruction) ([place]) 06/03/2012  . Shortness of breath   . Spastic hemiplegia affecting dominant side ([place])   . Stomach problems   . Stroke ([place])   . Ventral hernia    RIGHT LOWER QUADRANT-CAUSING SOME PAIN  . Weakness of right side of body     Past Surgical History:  Procedure Laterality Date  . APPLICATION OF WOUND VAC N/A 05/25/2015   Procedure: APPLICATION OF WOUND VAC;  Surgeon: Michael Boston, MD;  Location: [facility];  Service: General;  Laterality: N/A;  . CESAREAN SECTION  2012  . COLONOSCOPY    . DIAGNOSTIC LAPAROSCOPY    . ESOPHAGOGASTRODUODENOSCOPY N/A 06/03/2012   Procedure: ESOPHAGOGASTRODUODENOSCOPY (EGD);  Surgeon: [name] Craver, MD;  Location: [facility] ENDOSCOPY;  Service: Endoscopy;  Laterality: N/A;  . EXCISION MASS ABDOMINAL N/A 05/25/2015   Procedure: ABDOMINAL WALL EXPLORATION EXCISION OF SEROMA REMOVAL OF REDUNDANT SKIN ;  Surgeon: Michael Boston, MD;  Location: [facility];  Service: General;  Laterality: N/A;  . EYE SURGERY     Eye laser for vessel hemorrhaging  . fybroid removal    . HERNIA REPAIR  10/03/11   ventral hernia repair  . INSERTION OF MESH N/A 02/04/2013   Procedure: INSERTION OF MESH;  Surgeon: Adin Hector, MD;  Location: [facility];  Service: General;  Laterality: N/A;  . UMBILICAL HERNIA REPAIR N/A 02/04/2013   Procedure: LAPAROSCOPIC ventral wall hernia repair LAPAROSCOPIC LYSIS OF ADHESIONS laparoscopic exploration of abdomen ;  Surgeon: Adin Hector, MD;  Location: [facility];  Service: General;  Laterality: N/A;  . UPPER GASTROINTESTINAL ENDOSCOPY    . URETER REVISION     Bilateral "twisted"  . UTERINE FIBROID SURGERY     2 SURGERIES FOR FIBROIDS  . VENTRAL HERNIA REPAIR  10/03/2011   Procedure: LAPAROSCOPIC VENTRAL HERNIA;  Surgeon: Adin Hector, MD;  Location: [facility];  Service: General;   Laterality: N/A;     Current Outpatient Medications on File Prior to Visit  Medication Sig Dispense Refill  . allopurinol (ZYLOPRIM) 100 MG tablet Take 100 mg by mouth every evening.    Marland Kitchen aspirin 325 MG tablet Take 325 mg by mouth at bedtime.     Marland Kitchen atorvastatin (LIPITOR) 10 MG tablet Take 1 tablet (10 mg total) by mouth daily. 30 tablet 0  . buPROPion (WELLBUTRIN SR) 150 MG 12 hr tablet Take 1 tablet (150 mg total) by mouth daily. 30 tablet 0  . cetirizine (ZYRTEC) 10 MG tablet Take 10 mg by mouth daily as needed for allergies.   11  . cloNIDine (CATAPRES) 0.1 MG tablet Take 0.1 mg by mouth 2 (two) times daily.  0  . DULoxetine (CYMBALTA) 20 MG capsule Take 20 mg by mouth daily. 20 mg am, 40 mg pm    . EPINEPHrine 0.3 mg/0.3 mL IJ SOAJ injection INJECT AS DIRECTED AS NEEDED FOR ALLERGIC REACTION 1 Device 2  . fluticasone (FLONASE) 50 MCG/ACT nasal spray daily as needed.     . furosemide (LASIX) 40 MG tablet Take 40 mg by mouth.    . gabapentin (NEURONTIN) 300 MG capsule TAKE  2 CAPSULES BY MOUTH THREE TIMES DAILY 180 capsule 2  . insulin aspart (NOVOLOG) 100 UNIT/ML injection Inject 5-15 Units into the skin 3 (three) times daily with meals. Per sliding scale--pt uses Omnipod and gets a basal rate     . omeprazole (PRILOSEC) 40 MG capsule Take 1 capsule by mouth once daily 90 capsule 0  . ondansetron (ZOFRAN-ODT) 4 MG disintegrating tablet Take 1 tablet (4 mg total) by mouth every 8 (eight) hours as needed for nausea or vomiting. Dissolve under tongue 90 tablet 1  . Semaglutide (OZEMPIC, 0.25 OR 0.5 MG/DOSE, [place]) Inject 0.5 mg into the skin.    Marland Kitchen tiZANidine (ZANAFLEX) 2 MG tablet TAKE 1 TABLET BY MOUTH EVERY NIGHT AT BEDTIME 90 tablet 0  . traMADol (ULTRAM) 50 MG tablet Take 1 tablet by mouth twice daily 60 tablet 0  . Vitamin D, Ergocalciferol, (DRISDOL) 1.25 MG (50000 UNIT) CAPS capsule Take 1 capsule (50,000 Units total) by mouth every 7 (seven) days. 4 capsule 0   No current  facility-administered medications on file prior to visit.    Allergies  Allergen Reactions  . Contrast Media [Iodinated Diagnostic Agents] Other (See Comments)    Difficulty breathing  . Iohexol Hives, Nausea And Vomiting and Swelling     Desc: Magnevist-gadolinium-difficulty breathing, throat swelling   . Midazolam Hcl Anaphylaxis    Difficulty breathing  . Shellfish Allergy Anaphylaxis  . Valsartan Swelling  . Metformin And Related Diarrhea and Nausea And Vomiting  . Other Itching    Patient is allergic to all nuts except peanuts.   Wilder Glade [Dapagliflozin] Nausea Only and Other (See Comments)    Nausea, UTI, headaches and muscle aches  . Hydralazine   . Spironolactone Other (See Comments)    Elevated potassium   . Avandia [Rosiglitazone Maleate] Hives and Other (See Comments)  . Geodon [Ziprasidone] Other (See Comments)    UNKNOWN  . Kiwi Extract Itching and Swelling  . Latex Itching    Physical exam:  Today's Vitals   09/12/19 1112  BP: (!) 158/92  Pulse: 92  Weight: 241 lb (109.3 kg)  Height: _0  (1.676 m)   Body mass index is 38.9 kg/m.   Wt Readings from Last 3 Encounters:  09/12/19 241 lb (109.3 kg)  08/10/19 (!) 231 lb (104.8 kg)  08/04/19 (!) 244 lb (110.7 kg)     Ht Readings from Last 3 Encounters:  09/12/19 _1  (1.676 m)  08/10/19 _2  (1.676 m)  08/04/19 5' 6.5" (1.689 m)      General: The patient is awake, alert and appears not in acute distress. The patient is well groomed. Head: Normocephalic, atraumatic. Neck is supple. Mallampati 3  neck circumference:16 inches . Nasal airflow patent.  Retrognathia is  seen.   Cardiovascular:  Regular rate and cardiac rhythm by pulse,  without distended neck veins. Respiratory: Lungs are clear to auscultation.  Skin:  Without evidence of ankle edema, or rash. Trunk: The patient's posture is erect.   Neurologic exam : The patient is awake and alert, oriented to place and time.   Memory subjective  described as intact.  Attention span & concentration ability appears normal.  Speech is fluent,  without  dysarthria, dysphonia or aphasia.  Mood and affect are appropriate.   Cranial nerves: no loss of smell or taste reported - less taste but not a loss. No covid infection. Fully vaccinated.   Pupils are equal and briskly reactive to light. Funduscopic exam def.erreed  Extraocular  movements in vertical and horizontal planes were intact and without nystagmus. No Diplopia. Visual fields by finger perimetry are intact. Hearing was intact to soft voice and finger rubbing.    Facial sensation intact to fine touch.  Facial motor strength is symmetric and tongue and uvula move midline.  Neck ROM : rotation, tilt and flexion extension were normal for age and shoulder shrug was symmetrical.    Motor exam:  Symmetric bulk and ROM.   Normal tone without cog- wheeling, right sided elevated tone over biceps. Right sided reduced  grip strength .   Sensory:  Fine touch, pinprick and vibration were normal.  Proprioception tested in the upper extremities was normal.   Coordination: Rapid alternating movements in the fingers/hands were of normal speed.  The Finger-to-nose maneuver was intact without evidence of ataxia, dysmetria or tremor.   Gait and station: Patient could rise unassisted from a seated position, walked without assistive device.  Stance is of normal width/ base and the patient turned with 3steps.  Toe and heel walk were deferred.  Deep tendon reflexes: in the  upper and lower extremities are symmetric and intact.  Babinski response was deferred.      After spending a total time of 49 minutes face to face and additional time for physical and neurologic examination, review of laboratory studies,  personal review of imaging studies, reports and results of other testing and review of referral information / records as far as provided in visit, I have established the following  assessments:  1) Mrs. Margolis describes sleep paralysis attacks, the irresistible urge to fall asleep in daytime she can not help it, she has stated that she has vivid dreams that sometimes are hard to differentiate from reality and that she has vivid dreams throughout the night sometimes waking her.  She did not endorse cataplectic symptoms.  Knowing that in the past she had tested negative for sleep apnea or for a degree of sleep apnea that was too mild I think we need to consider both again reevaluate her for possible apnea #1 #2 evaluate her for possible narcolepsy.  Her stroke affected the left brain hemisphere and left her with some mild residual right-sided symptoms, it is not usually the location that would be associated with hypersomnia.  That can lead post stroke depression which also can lead to a blunted affect and less energy overall.    My Plan is to proceed with:  1)HLA narcolepsy panel. 2) if positive will order PSG with MSLT, if negative will pursue first screening for sleep apnea.  3) Trazodone at bedtime is recommended   I would like to thank Dr Dennard Nip ,MD  for allowing me to meet with this pleasant patient for a sleep medicine consultation.    I plan to follow up either personally or through our NP within 2-3.  month.   CC: I will share my notes with PCP, too.  Electronically signed by: Larey Seat, MD 09/12/2019 11:13 AM  [facility] Board certified by The [employer] of Sleep Medicine and Diplomate of the [employer] of Sleep Medicine. Board certified In Neurology through the [place], Fellow of the [employer] of Neurology. Medical Director of [facility].

## 2019-09-21 ENCOUNTER — Encounter: Payer: Self-pay | Admitting: Neurology

## 2019-09-21 ENCOUNTER — Encounter (INDEPENDENT_AMBULATORY_CARE_PROVIDER_SITE_OTHER): Payer: Self-pay | Admitting: Bariatrics

## 2019-09-21 LAB — NARCOLEPSY EVALUATION
DQA1*01:02: POSITIVE
DQB1*06:02: POSITIVE

## 2019-09-21 NOTE — Progress Notes (Signed)
Double positive HLA

## 2019-09-21 NOTE — Telephone Encounter (Signed)
Please review

## 2019-09-22 ENCOUNTER — Telehealth: Payer: Self-pay | Admitting: Neurology

## 2019-09-22 ENCOUNTER — Other Ambulatory Visit: Payer: Self-pay

## 2019-09-22 ENCOUNTER — Encounter: Payer: Self-pay | Admitting: Sports Medicine

## 2019-09-22 ENCOUNTER — Ambulatory Visit (INDEPENDENT_AMBULATORY_CARE_PROVIDER_SITE_OTHER): Payer: Federal, State, Local not specified - PPO | Admitting: Sports Medicine

## 2019-09-22 DIAGNOSIS — M79674 Pain in right toe(s): Secondary | ICD-10-CM

## 2019-09-22 DIAGNOSIS — M79675 Pain in left toe(s): Secondary | ICD-10-CM

## 2019-09-22 DIAGNOSIS — B351 Tinea unguium: Secondary | ICD-10-CM

## 2019-09-22 DIAGNOSIS — L603 Nail dystrophy: Secondary | ICD-10-CM

## 2019-09-22 DIAGNOSIS — E1142 Type 2 diabetes mellitus with diabetic polyneuropathy: Secondary | ICD-10-CM | POA: Diagnosis not present

## 2019-09-22 NOTE — Telephone Encounter (Signed)
Called the patient to discuss how she would like to proceed forward with sleep testing. With her lab work coming back indicating that HLA was positive in both strands, we could proceed with overnight/MSLT study but advised the patient she would have to wean off her medications. I advised the patient that if she weans off medications and then comes in and overnight study shows sleep apnea then the technician would not proceed forward with the MSLT. With the amount of meds that she is on and she would have to come off of we agreed completing HST first and then if that is negative we can proceed with further testing.  If patient is negative and we move forward with overnight/MSLT, then we would need patient to wean off of buproprion, tramadol, tizanidine, gabapentin, duloxetine and be off meds for at least 14 days. Pt verbalized understanding of this information.

## 2019-09-22 NOTE — Progress Notes (Signed)
Subjective: Tina [name] is a 49 y.o. female patient with history of diabetes who presents to office today complaining of long,mildly painful nails  while ambulating in shoes; unable to trim, reports she wants to have her nails checked for fungus and states that her blood sugars have been ranging from 90-145 and her last A1c was around 8.  No other pedal complaints noted.  Patient Active Problem List   Diagnosis Date Noted   Excessive daytime sleepiness 09/12/2019   Sleep paralysis 09/12/2019   Hypnopompic hallucination 09/12/2019   Abnormal dreams 09/12/2019   Diabetic macular edema of right eye with proliferative retinopathy associated with type 2 diabetes mellitus ([place]) 07/04/2019   Stable treated proliferative diabetic retinopathy of left eye determined by examination associated with type 2 diabetes mellitus ([place]) 07/04/2019   Long-term insulin use ([place]) 04/07/2019   Depression 04/13/2018   History of vertigo 03/22/2018   Laboratory examination 03/22/2018   Chronic diastolic (congestive) heart failure ([place]) 03/22/2018   Other hyperlipidemia 02/15/2018   Vitamin D deficiency 12/31/2017   Class 2 severe obesity with serious comorbidity and body mass index (BMI) of 39.0 to 39.9 in adult ([place]) 11/18/2017   Gait disturbance, post-stroke 04/23/2017   Strain of gluteus medius 04/23/2017   OSA (obstructive sleep apnea) 10/17/2015   Periodic limb movement disorder (PLMD) 10/17/2015   Essential hypertension 05/26/2015   Diabetes mellitus ([place]) 05/26/2015   Abdominal wall mass of right lower quadrant 05/25/2015   Hemiparesis affecting right side as late effect of stroke ([place]) 11/09/2014   Abdominal wall seroma    Sepsis due to undetermined organism ([place]) 03/06/2014   Nausea vomiting and diarrhea 03/06/2014   Sepsis ([place]) 03/06/2014   Tension headache 09/01/2013   Nausea with vomiting ?Gastroparesis? 03/21/2013   Spastic hemiplegia affecting dominant side  ([place]) 02/16/2013   Recurrent ventral incisional hernia s/p closure/repair w mesh 02/04/2013 01/26/2013   Constipation, chronic 01/26/2013   Abdominal pain, chronic, right lower quadrant 06/03/2012   History of CVA (cerebrovascular accident) 06/03/2012   HTN (hypertension) 06/03/2012   Diabetes mellitus type II, uncontrolled ([place]) 10/16/2011   Obesity (BMI 30-39.9) 09/02/2011   Rotator cuff tear 08/25/2011   Sciatica 06/10/2011   Paresthesias in right hand 06/10/2011   Lumbago 05/09/2011   Paresthesias 05/09/2011   Current Outpatient Medications on File Prior to Visit  Medication Sig Dispense Refill   allopurinol (ZYLOPRIM) 100 MG tablet Take 100 mg by mouth every evening.     amLODipine (NORVASC) 10 MG tablet Take 10 mg by mouth daily.     aspirin 325 MG tablet Take 325 mg by mouth at bedtime.      atorvastatin (LIPITOR) 10 MG tablet Take 1 tablet (10 mg total) by mouth daily. 30 tablet 0   buPROPion (WELLBUTRIN SR) 150 MG 12 hr tablet Take 1 tablet (150 mg total) by mouth daily. 30 tablet 0   cetirizine (ZYRTEC) 10 MG tablet Take 10 mg by mouth daily as needed for allergies.   11   cloNIDine (CATAPRES) 0.1 MG tablet Take 0.1 mg by mouth 2 (two) times daily.  0   cloNIDine (CATAPRES) 0.2 MG tablet Take 0.2 mg by mouth 2 (two) times daily.     Continuous Blood Gluc Receiver (FREESTYLE LIBRE 2 READER) DEVI daily. as directed     Continuous Blood Gluc Sensor (FREESTYLE LIBRE 2 SENSOR) MISC USE 1 SENSOR EVERY 14 DAYS AS DIRECTED     DULoxetine (CYMBALTA) 20 MG capsule Take 20 mg by mouth daily.  20 mg am, 40 mg pm     DULoxetine (CYMBALTA) 60 MG capsule Take 60 mg by mouth at bedtime.     EPINEPHrine 0.3 mg/0.3 mL IJ SOAJ injection INJECT AS DIRECTED AS NEEDED FOR ALLERGIC REACTION 1 Device 2   FARXIGA 10 MG TABS tablet Take 10 mg by mouth daily.     fluticasone (FLONASE) 50 MCG/ACT nasal spray daily as needed.      furosemide (LASIX) 40 MG tablet Take 40 mg by  mouth.     gabapentin (NEURONTIN) 300 MG capsule TAKE 2 CAPSULES BY MOUTH THREE TIMES DAILY 180 capsule 2   insulin aspart (NOVOLOG) 100 UNIT/ML injection Inject 5-15 Units into the skin 3 (three) times daily with meals. Per sliding scale--pt uses Omnipod and gets a basal rate      Insulin Glargine (BASAGLAR KWIKPEN) 100 UNIT/ML SMARTSIG:18 Unit(s) SUB-Q Every Night     omeprazole (PRILOSEC) 40 MG capsule Take 1 capsule by mouth once daily 90 capsule 0   ondansetron (ZOFRAN-ODT) 4 MG disintegrating tablet Take 1 tablet (4 mg total) by mouth every 8 (eight) hours as needed for nausea or vomiting. Dissolve under tongue 90 tablet 1   predniSONE (DELTASONE) 5 MG tablet Take 5 mg by mouth daily.     Semaglutide (OZEMPIC, 0.25 OR 0.5 MG/DOSE, [place]) Inject 0.5 mg into the skin.     tiZANidine (ZANAFLEX) 2 MG tablet TAKE 1 TABLET BY MOUTH EVERY NIGHT AT BEDTIME 90 tablet 0   traMADol (ULTRAM) 50 MG tablet Take 1 tablet by mouth twice daily 60 tablet 0   Vitamin D, Ergocalciferol, (DRISDOL) 1.25 MG (50000 UNIT) CAPS capsule Take 1 capsule (50,000 Units total) by mouth every 7 (seven) days. 4 capsule 0   No current facility-administered medications on file prior to visit.   Allergies  Allergen Reactions   Contrast Media [Iodinated Diagnostic Agents] Other (See Comments)    Difficulty breathing   Iohexol Hives, Nausea And Vomiting and Swelling     Desc: Magnevist-gadolinium-difficulty breathing, throat swelling    Midazolam Hcl Anaphylaxis    Difficulty breathing   Shellfish Allergy Anaphylaxis   Valsartan Swelling   Metformin And Related Diarrhea and Nausea And Vomiting   Other Itching    Patient is allergic to all nuts except peanuts.    Wilder Glade [Dapagliflozin] Nausea Only and Other (See Comments)    Nausea, UTI, headaches and muscle aches   Hydralazine    Spironolactone Other (See Comments)    Elevated potassium    Avandia [Rosiglitazone Maleate] Hives and Other (See  Comments)   Geodon [Ziprasidone] Other (See Comments)    UNKNOWN   Kiwi Extract Itching and Swelling   Latex Itching    Recent Results (from the past 2160 hour(s))  ToxAssure Select Plus     Status: None   Collection Time: 07/05/19  1:30 PM  Result Value Ref Range   Summary Note     Comment: ==================================================================== ToxAssure Select,+Antidepr,UR ==================================================================== Test                             Result       Flag       Units  Drug Present   Bupropion                      PRESENT   Hydroxybupropion               PRESENT  Hydroxybupropion is an expected metabolite of bupropion.    Duloxetine                     PRESENT ==================================================================== Test                      Result    Flag   Units      Ref Range   Creatinine              60               mg/dL      >=20 ==================================================================== Declared Medications:  Medication list was not provided. ==================================================================== For clinical consultation, please call [phone]. ====================================================================   Microalbumin, urine     Status: None   Collection Time: 08/02/19 12:00 AM  Result Value Ref Range   Microalb, Ur 4,970.2   Basic metabolic panel     Status: Abnormal   Collection Time: 08/02/19 12:00 AM  Result Value Ref Range   BUN 15 4 - 21   CO2 26 (A) 13 - 22   Creatinine 1.1 0.5 - 1.1   Potassium 4.7 3.4 - 5.3   Sodium 139 137 - 147   Chloride 101 99 - 108  Comprehensive metabolic panel     Status: Abnormal   Collection Time: 08/02/19 12:00 AM  Result Value Ref Range   Globulin 2.6    GFR calc Af Amer 68    GFR calc non Af Amer 59    Calcium 8.7 8.7 - 10.7   Albumin 3.3 (A) 3.5 - 5.0  Lipid panel     Status: Abnormal   Collection Time:  08/02/19 12:00 AM  Result Value Ref Range   LDl/HDL Ratio 1.9    Triglycerides 207 (A) 40 - 160   Cholesterol 191 0 - 200   HDL 54 35 - 70   LDL Cholesterol 102   Hepatic function panel     Status: None   Collection Time: 08/02/19 12:00 AM  Result Value Ref Range   Alkaline Phosphatase 79 25 - 125   ALT 11 7 - 35   AST 14 13 - 35   Bilirubin, Total 0.2   Hemoglobin A1c     Status: None   Collection Time: 08/02/19 12:00 AM  Result Value Ref Range   Hemoglobin A1C 8.4   NARCOLEPSY EVALUATION     Status: None   Collection Time: 09/12/19 12:00 PM  Result Value Ref Range   DQA1*01:02 Positive    DQB1*06:02 Positive     Comment: Final Results: DQA1*01:CUJJN,03:CUJJP DQB1*03:CUWXH,06:CUJJS Code Translation: CUJJN            01:02/01:08/01:09/01:11/01:16N/01:19/01:20                  /01:21/01:23/01:25/01:31/01:32/01:38/01:39                  /01:40Q/01:41/01:42 CUJJP            01/14/01/04/17/04/08/21/09/12/25/12 CUJJS            06:02/06:46/06:47/06:84/06:107/06:109                  /63:785/88:502/77:412/87:867/67:209/47:096                  /28:366/29:476/54:650/35:465/68:127N                  /06:219/06:224/06:225/06:226/06:228/06:237                  /17:001/74:944/96:759/16:384/66:599/35:701                  /  77:414/23:953/20:233/43:568/61:683/72:902                  /06:297/06:300/06:304N/06:308N/06:311                  /06:314/06:315/06:317N/06:324/06:326                  /06:333/06:335/06:338/06:341N/06:344                  /06:347/06:355/06:356/06:357 [place]            03:02/03:32/03:45/03:85/03:190/03:199                  /03:211/03:245/03:247/03:251/03:265/03:277                  / 03:279/03:287/03:289/03:295/03:296/03:298                  /03:299/03:300/03:301/03:308/03:310N                  /03:315/03:320/03:321/03:322/03:323/03:324                  /11:155M/08:022/33:612/24:497/53:005                  /11:021/11:735/67:014/10:301/31:438/88:757                   /03:379/03:383/03:386/03:392/03:403N                  /97:282/06:015/61:537/94:327/61:470/92:957 [place] interpretation for all loci based on IMGT/HLA database version 3.39.0 HLA Lab CLIA ID Number [id number]    Comment: Comment     Comment: This test was performed using Polymerase Chain Reaction/(PCR)Sequence Specific Oligonucleotide Probes (SSOP) (Luminex) technique. Sequence Based Typing (SBT) and/or Sequence Specific Primers (SSP) may be used as supplemental methods when necessary. Please contact HLA Customer Service at [phone] if you have any questions. Director of HLA Laboratory Dr Brooks Sailors, PhD    Please Note Comment     Comment: A correlation between HLA-DQ types and Narcolepsy-cataplexy has been well documented. However, these correlated HLA types are common in the normal population and are not diagnostic for this disorder. A positive result for this test indicates the patient has these associated HLA alleles and in the presence of other symptoms may support a diagnosis of narcolepsy.     Objective: General: Patient is awake, alert, and oriented x 3 and in no acute distress.  Integument: Skin is warm, dry and supple bilateral. Nails are mildly elongated thickened and dystrophic with subungual debris, consistent with onychomycosis, 1-5 bilateral.  Nails are short and thickened with subungual debris as above likely onychomycosis, no open lesions or preulcerative lesions present bilateral. Remaining integument unremarkable.  Vasculature:  Dorsalis Pedis pulse 1/4 bilateral. Posterior Tibial pulse  1/4 bilateral.  Capillary fill time <3 sec 1-5 bilateral. Positive hair growth to the level of the digits. Temperature gradient within normal limits. No varicosities present bilateral. No edema present bilateral.   Neurology: The patient has diminshed sensation measured with a 5.07/10g Semmes Weinstein Monofilament at all pedal sites bilateral . Vibratory sensation  diminished bilateral with tuning fork. No Babinski sign present bilateral.   Musculoskeletal: Asymptomatic pes planus and hammertoe pedal deformities noted bilateral. Muscular strength 5/5 in all lower extremity muscular groups bilateral without pain on range of motion . No tenderness with calf compression bilateral.  Assessment and Plan: Problem List Items Addressed This Visit    None    Visit Diagnoses    Onychodystrophy    -  Primary   Relevant Orders   Culture, fungus without smear   Pain  due to onychomycosis of toenails of both feet       Diabetic polyneuropathy associated with type 2 diabetes mellitus (HCC)       Relevant Medications   FARXIGA 10 MG TABS tablet   DULoxetine (CYMBALTA) 60 MG capsule   Insulin Glargine (BASAGLAR KWIKPEN) 100 UNIT/ML       -Examined patient. -Re-Discussed and educated patient on diabetic foot care, especially with  regards to the vascular, neurological and musculoskeletal systems. . -Mechanically debrided all nails 1-5 bilateral using sterile nail nipper and filed with dremel without incident  -No treatments were sent to [facility] pathology for fungal analysis -Continue with neuropathy medications like previous -Answered all patient questions -Patient to return in 1 month to review fungal culture results -Patient advised to call the office if any problems or questions arise in the meantime.  Landis Martins, DPM

## 2019-09-29 ENCOUNTER — Telehealth: Payer: Self-pay

## 2019-09-29 NOTE — Telephone Encounter (Signed)
LVM for pt to call me back to schedule sleep study  

## 2019-10-01 DIAGNOSIS — M25512 Pain in left shoulder: Secondary | ICD-10-CM | POA: Diagnosis not present

## 2019-10-01 DIAGNOSIS — W101XXA Fall (on)(from) sidewalk curb, initial encounter: Secondary | ICD-10-CM | POA: Diagnosis not present

## 2019-10-01 DIAGNOSIS — S0993XA Unspecified injury of face, initial encounter: Secondary | ICD-10-CM | POA: Diagnosis not present

## 2019-10-01 DIAGNOSIS — R0781 Pleurodynia: Secondary | ICD-10-CM | POA: Diagnosis not present

## 2019-10-01 DIAGNOSIS — M546 Pain in thoracic spine: Secondary | ICD-10-CM | POA: Diagnosis not present

## 2019-10-12 ENCOUNTER — Ambulatory Visit: Payer: HMO | Admitting: Cardiology

## 2019-10-17 ENCOUNTER — Ambulatory Visit: Payer: HMO | Admitting: Cardiology

## 2019-10-17 ENCOUNTER — Other Ambulatory Visit: Payer: Self-pay

## 2019-10-17 ENCOUNTER — Encounter: Payer: Self-pay | Admitting: Cardiology

## 2019-10-17 VITALS — BP 137/77 | HR 70 | Ht 66.5 in | Wt 243.0 lb

## 2019-10-17 DIAGNOSIS — E1140 Type 2 diabetes mellitus with diabetic neuropathy, unspecified: Secondary | ICD-10-CM | POA: Diagnosis not present

## 2019-10-17 DIAGNOSIS — Z6838 Body mass index (BMI) 38.0-38.9, adult: Secondary | ICD-10-CM | POA: Diagnosis not present

## 2019-10-17 DIAGNOSIS — I10 Essential (primary) hypertension: Secondary | ICD-10-CM

## 2019-10-17 DIAGNOSIS — I6930 Unspecified sequelae of cerebral infarction: Secondary | ICD-10-CM | POA: Diagnosis not present

## 2019-10-17 DIAGNOSIS — I5189 Other ill-defined heart diseases: Secondary | ICD-10-CM

## 2019-10-17 DIAGNOSIS — Z794 Long term (current) use of insulin: Secondary | ICD-10-CM

## 2019-10-17 NOTE — Progress Notes (Signed)
Primary Physician:  [name], DO  Patient ID: Tina [name], female    DOB: 08-30-1970, 49 y.o.   MRN: [id number]  Date: 10/17/19 Last Office Visit: 04/07/2019  Subjective:   Chief Complaint  Patient presents with  . Congestive Heart Failure    pt having Sleep study soon  . Follow-up     HPI: Tina [name]  is a 49 y.o. female  with history of CVA with residual right-sided hemiparesis which is essentially improved and does very minimal residual defect, insulin dependent diabetes mellitus, obesity, hypertension, obesity comes in to the office with a chief complaint of follow up.   Since last office visit patient states that she is doing well from a cardiovascular standpoint.  She is being followed for diastolic dysfunction.  Medications reconciled.  Patient in the past has not been able to tolerate Aldactone, hydralazine, valsartan, and Farxiga.  Overall euvolemic.  She denies any chest pain or shortness of breath at rest or with effort related activities.  She has increased weight since last office visit.  Patient states that she started walking every day with her husband with the intention of weight loss.  Patient states that she is also having difficulty sleeping at night and is scheduled for home sleep study in the near future.  Of note, patient was drowsy during today's encounter in the office as well.  Past Medical History:  Diagnosis Date  . Allergy   . Anemia    DURING MENSES--HAS HEAVY BLEEDING WITH PERODS  . Anxiety   . Back pain, chronic    "ongoing"  . Blood transfusion    IN 2012  AFTER C -SECTION  . Cerebral thrombosis with cerebral infarction [facility]) JUNE 2011   RIGHT SIDED WEAKNESS ( ARM AND LEG ) AND SPASMS-remains with slight weakness and vertigo.  . Constipation   . Depression   . Diabetes mellitus   . Diabetic neuropathy ([place])    BOTH FEET --COMES AND GOES  . Edema, lower extremity   . Endometriosis   . Fatty liver   . GERD  (gastroesophageal reflux disease)    with pregnancy  . H/O eye surgery   . Headache(784.0)    MIGRAINES--NOT REALLY HEADACHE-MORE LIKE PRESSURE SENSATION IN HEAD-FEELS DIZZIY AND  FAINT AS THE PRESSURE RESOLVES  . Hernia, incisional, RLQ, s/p lap repair Sep 2013 09/02/2011  . History of vertigo 03/22/2018  . Hx of migraines 10/19/2011  . Hypertension   . Leg pain, right    "like bad Charley horse"  . Multiple food allergies   . Panic disorder without agoraphobia   . Rash    HANDS, ARMS --STATES HX OF RASH EVER SINCE CHILDBIRTH/PREGNANCY.  STATES THE RASH OFTEN OCCURS WHEN SHE IS REALLY STRESSED."goes and comes-presently left ring finger"  . Restless leg syndrome    DIAGNOSED BY SLEEP STUDY - PT TOLD SHE DID NOT HAVE SLEEP APNEA  . Right rotator cuff tear    PAIN IN RIGHT SHOULDER  . SBO (small bowel obstruction) ([place]) 06/03/2012  . Shortness of breath   . Spastic hemiplegia affecting dominant side ([place])   . Stomach problems   . Stroke ([place])   . Ventral hernia    RIGHT LOWER QUADRANT-CAUSING SOME PAIN  . Weakness of right side of body     Past Surgical History:  Procedure Laterality Date  . APPLICATION OF WOUND VAC N/A 05/25/2015   Procedure: APPLICATION OF WOUND VAC;  Surgeon: Michael Boston, MD;  Location: [facility];  Service: General;  Laterality: N/A;  . CESAREAN SECTION  2012  . COLONOSCOPY    . DIAGNOSTIC LAPAROSCOPY    . ESOPHAGOGASTRODUODENOSCOPY N/A 06/03/2012   Procedure: ESOPHAGOGASTRODUODENOSCOPY (EGD);  Surgeon: [name] Craver, MD;  Location: [facility] END[facility]COPY;  Service: Endoscopy;  Laterality: N/A;  . EXCISION MASS ABDOMINAL N/A 05/25/2015   Procedure: ABDOMINAL WALL EXPLORATION EXCISION OF SEROMA REMOVAL OF REDUNDANT SKIN ;  Surgeon: Michael Boston, MD;  Location: [facility];  Service: General;  Laterality: N/A;  . EYE SURGERY     Eye laser for vessel hemorrhaging  . fybroid removal    . HERNIA REPAIR  10/03/11   ventral hernia repair  . INSERTION OF MESH N/A 02/04/2013   Procedure:  INSERTION OF MESH;  Surgeon: Adin Hector, MD;  Location: [facility];  Service: General;  Laterality: N/A;  . UMBILICAL HERNIA REPAIR N/A 02/04/2013   Procedure: LAPAROSCOPIC ventral wall hernia repair LAPAROSCOPIC LYSIS OF ADHESIONS laparoscopic exploration of abdomen ;  Surgeon: Adin Hector, MD;  Location: [facility];  Service: General;  Laterality: N/A;  . UPPER GASTROINTESTINAL ENDOSCOPY    . URETER REVISION     Bilateral "twisted"  . UTERINE FIBROID SURGERY     2 SURGERIES FOR FIBROIDS  . VENTRAL HERNIA REPAIR  10/03/2011   Procedure: LAPAROSCOPIC VENTRAL HERNIA;  Surgeon: Adin Hector, MD;  Location: [facility];  Service: General;  Laterality: N/A;    Social History   Tobacco Use  . Smoking status: Never Smoker  . Smokeless tobacco: Never Used  Vaping Use  . Vaping Use: Never used  Substance Use Topics  . Alcohol use: No    Alcohol/week: 0.0 standard drinks  . Drug use: No     Review of Systems  Constitutional: Positive for weight gain. Negative for decreased appetite and malaise/fatigue.  Eyes: Negative for visual disturbance.  Cardiovascular: Negative for chest pain, claudication, dyspnea on exertion, leg swelling, orthopnea, palpitations and syncope.  Respiratory: Positive for sleep disturbances due to breathing. Negative for hemoptysis and wheezing.   Endocrine: Negative for cold intolerance and heat intolerance.  Hematologic/Lymphatic: Does not bruise/bleed easily.  Skin: Negative for nail changes.  Musculoskeletal: Negative for muscle weakness and myalgias.  Gastrointestinal: Negative for abdominal pain, change in bowel habit, nausea and vomiting.  Neurological: Negative for difficulty with concentration, dizziness, focal weakness and headaches.  Psychiatric/Behavioral: Negative for altered mental status and suicidal ideas.  All other systems reviewed and are negative.     Objective:  Blood pressure 137/77, pulse 70, height 5' 6.5" (1.689 m), weight 243 lb (110.2  kg), SpO2 98 %. Body mass index is 38.63 kg/m.    Physical Exam Vitals reviewed.  Constitutional:      Appearance: She is well-developed.  HENT:     Head: Normocephalic and atraumatic.  Cardiovascular:     Rate and Rhythm: Normal rate and regular rhythm.     Pulses: Intact distal pulses.          Femoral pulses are 2+ on the right side and 2+ on the left side.      Popliteal pulses are 2+ on the right side and 2+ on the left side.       Dorsalis pedis pulses are 2+ on the right side and 2+ on the left side.       Posterior tibial pulses are 2+ on the right side and 2+ on the left side.     Heart sounds: S1 normal and S2 normal. No murmur heard.  No gallop.   Pulmonary:     Effort: Pulmonary effort is normal. No accessory muscle usage or respiratory distress.     Breath sounds: Normal breath sounds. No wheezing or rales.  Abdominal:     General: Bowel sounds are normal.     Palpations: Abdomen is soft.  Musculoskeletal:        General: Normal range of motion.     Cervical back: Normal range of motion.  Skin:    General: Skin is warm and dry.  Neurological:     Mental Status: She is alert and oriented to person, place, and time.    Radiology: No results found.  Laboratory examination:    CMP Latest Ref Rng & Units 08/02/2019 04/12/2019 02/16/2019  Glucose 65 - 99 mg/dL - 323(H) -  BUN 4 - 21 15 13 13   Creatinine 0.5 - 1.1 1.1 1.09(H) 1.0  Sodium 137 - 147 139 136 139  Potassium 3.4 - 5.3 4.7 5.0 4.9  Chloride 99 - 108 101 98 104  CO2 13 - 22 26(A) 27 27(A)  Calcium 8.7 - 10.7 8.7 9.1 8.8  Total Protein 6.0 - 8.5 g/dL - 6.4 -  Total Bilirubin 0.0 - 1.2 mg/dL - <0.2 -  Alkaline Phos 25 - 125 79 93 71  AST 13 - 35 14 17 14   ALT 7 - 35 11 20 23    CBC Latest Ref Rng & Units 02/16/2019 12/08/2018 04/20/2018  WBC - 7.4 6.3 13.1(H)  Hemoglobin 12.0 - 16.0 11.7(A) 11.3(A) 13.5  Hematocrit 36 - 46 37 35(A) 42.4  Platelets 150 - 399 399 - 348   Lipid Panel     Component Value  Date/Time   CHOL 191 08/02/2019 0000   CHOL 169 02/15/2018 1034   TRIG 207 (A) 08/02/2019 0000   HDL 54 08/02/2019 0000   HDL 41 02/15/2018 1034   CHOLHDL 6.0 10/17/2011 0315   VLDL 59 (H) 10/17/2011 0315   LDLCALC 102 08/02/2019 0000   LDLCALC 94 02/15/2018 1034   HEMOGLOBIN A1C Lab Results  Component Value Date   HGBA1C 8.4 08/02/2019   MPG 169 05/18/2015   TSH Recent Labs    04/12/19 1313  TSH 1.100    Current Meds  Medication Sig  . allopurinol (ZYLOPRIM) 100 MG tablet Take 100 mg by mouth every evening.  Marland Kitchen amLODipine (NORVASC) 10 MG tablet Take 10 mg by mouth daily.  Marland Kitchen aspirin EC 81 MG tablet Take 81 mg by mouth daily. Swallow whole.  Marland Kitchen atorvastatin (LIPITOR) 10 MG tablet Take 1 tablet (10 mg total) by mouth daily.  Marland Kitchen buPROPion (WELLBUTRIN SR) 150 MG 12 hr tablet Take 1 tablet (150 mg total) by mouth daily.  . cetirizine (ZYRTEC) 10 MG tablet Take 10 mg by mouth daily as needed for allergies.   . cloNIDine (CATAPRES) 0.2 MG tablet Take 0.2 mg by mouth 2 (two) times daily.  . Continuous Blood Gluc Receiver (FREESTYLE LIBRE 2 READER) DEVI daily. as directed  . Continuous Blood Gluc Sensor (FREESTYLE LIBRE 2 SENSOR) MISC USE 1 SENSOR EVERY 14 DAYS AS DIRECTED  . DULoxetine (CYMBALTA) 20 MG capsule Take 20 mg by mouth daily. 20 mg am, 40 mg pm  . EPINEPHrine 0.3 mg/0.3 mL IJ SOAJ injection INJECT AS DIRECTED AS NEEDED FOR ALLERGIC REACTION  . furosemide (LASIX) 40 MG tablet Take 40 mg by mouth.  . gabapentin (NEURONTIN) 300 MG capsule TAKE 2 CAPSULES BY MOUTH THREE TIMES DAILY  . insulin aspart (NOVOLOG)  100 UNIT/ML injection Inject 5-15 Units into the skin 3 (three) times daily with meals. Per sliding scale--pt uses Omnipod and gets a basal rate   . Insulin Glargine (BASAGLAR KWIKPEN) 100 UNIT/ML SMARTSIG:18 Unit(s) SUB-Q Every Night  . omeprazole (PRILOSEC) 40 MG capsule Take 1 capsule by mouth once daily  . Semaglutide (OZEMPIC, 0.25 OR 0.5 MG/DOSE, [place]) Inject 0.5 mg  into the skin.  Marland Kitchen tiZANidine (ZANAFLEX) 2 MG tablet TAKE 1 TABLET BY MOUTH EVERY NIGHT AT BEDTIME  . traMADol (ULTRAM) 50 MG tablet Take 1 tablet by mouth twice daily  . Vitamin D, Ergocalciferol, (DRISDOL) 1.25 MG (50000 UNIT) CAPS capsule Take 1 capsule (50,000 Units total) by mouth every 7 (seven) days.  . [DISCONTINUED] cloNIDine (CATAPRES) 0.1 MG tablet Take 0.1 mg by mouth 2 (two) times daily.    Cardiac Studies:   EKG: 10/17/2019: Normal sinus rhythm, 67 bpm, normal axis, nonspecific T wave abnormality.    Echocardiogram 08/21/2017: Left ventricle cavity is normal in size. Mild concentric hypertrophy of the left ventricle. Normal global wall motion. Doppler evidence of grade II (pseudonormal) diastolic dysfunction, elevated LAP. Calculated EF 55%. Mildly restricted aortic valve leaflets with trace aortic valve stenosis. Aortic valve mean gradient of 8 mmHg, Vmax of 2.0 m/s. Calculated aortic valve area by continuity equation is 1.4 cm. No evidence of pulmonary hypertension.  Lexiscan myoview stress test 08/21/2017:  1. Lexiscan stress test was performed. Exercise capacity was not assessed. Resting BP 148/90 mmHg, peak effect BP 168/90 mmHg. Stress symptoms included dizziness, nausea, headache, chest tightness.  2. The overall quality of the study is good. There is no evidence of abnormal lung activity. Stress and rest SPECT images demonstrate homogeneous tracer distribution throughout the myocardium. Gated SPECT imaging reveals normal myocardial thickening and wall motion. The left ventricular ejection fraction was normal calculated as 45%, although visually appears normal.  3. Low risk study.  ABI 08/21/2017: This exam reveals normal perfusion of both the lower extremity (RABI 1.27 and LABI 1.20 with biphasic waveform). ABI may be falsely elevated in patients with DM and medial calcinosis.  Assessment:     [id number]   1. Diastolic dysfunction without heart failure  I51.89 EKG  12-Lead  2. Essential hypertension  I10   3. History of CVA with residual deficit  I69.30   4. Type 2 diabetes mellitus with diabetic neuropathy, with long-term current use of insulin (HCC)  E11.40    Z79.4   5. Long-term insulin use (HCC)  Z79.4   6. Class 2 severe obesity due to excess calories with serious comorbidity and body mass index (BMI) of 38.0 to 38.9 in adult [facility])  E66.01    Z68.38    Recommendations:   Diastolic dysfunction:  Overall patient is euvolemic.  Medications reconciled.  In the past patient has been intolerant to Aldactone, ARB, hydralazine, procedure.  I have asked the patient to reevaluate these medications as many are guideline directed medical therapy .  May consider a re-trail if not true allergies. She agrees but wants to disccus with her PCP.   Will re-discuss it at the next office visit.   Insulin-dependent diabetes mellitus type 2: Currently managed by her PCP.  Hyperlipidemia:  Currently managed by primary care provider.  Last check in July 2021 per patient. No results to review today. Patient will have the labs sent over to the office to review.   Benign essential hypertension:  Office blood pressures well controlled.  Patient is currently not on Aldactone, ARB,  or hydralazine secondary to documented allergies and/or intolerances.  Obesity, due to excess calories: Body mass index is 38.63 kg/m.  Since last office visit patient has lost approximately 5 pounds.  Patient is congratulated on her efforts and the change that she has been able to accomplish with lifestyle modifications.  Patient continues to be seen at the weight loss clinic which was reemphasized and encouraged.  I reviewed with the patient the importance of diet, regular physical activity/exercise, weight loss.    Patient is educated on increasing physical activity gradually as tolerated.  With the goal of moderate intensity exercise for 30 minutes a day 5 days a  week.   Orders Placed This Encounter  Procedures  . EKG 12-Lead   --Continue cardiac medications as reconciled in final medication list. --Return in about 6 months (around 04/16/2020) for Follow up diastolic dysfunction.. Or sooner if needed. --Continue follow-up with your primary care physician regarding the management of your other chronic comorbid conditions.  Patient's questions and concerns were addressed to her satisfaction. She voices understanding of the instructions provided during this encounter.   This note was created using a voice recognition software as a result there may be grammatical errors inadvertently enclosed that do not reflect the nature of this encounter. Every attempt is made to correct such errors.  Total time spent: 23 minutes.   Rex Kras, DO, [facility]. [place] Office: [phone]

## 2019-10-20 ENCOUNTER — Other Ambulatory Visit: Payer: Self-pay

## 2019-10-20 ENCOUNTER — Encounter: Payer: Self-pay | Admitting: Sports Medicine

## 2019-10-20 ENCOUNTER — Ambulatory Visit (INDEPENDENT_AMBULATORY_CARE_PROVIDER_SITE_OTHER): Payer: HMO | Admitting: Sports Medicine

## 2019-10-20 DIAGNOSIS — E1142 Type 2 diabetes mellitus with diabetic polyneuropathy: Secondary | ICD-10-CM

## 2019-10-20 DIAGNOSIS — L603 Nail dystrophy: Secondary | ICD-10-CM

## 2019-10-20 DIAGNOSIS — Z79899 Other long term (current) drug therapy: Secondary | ICD-10-CM

## 2019-10-20 MED ORDER — ITRACONAZOLE 200 MG PO TABS: 1.0000 | ORAL_TABLET | Freq: Every day | ORAL | 0 refills | Status: DC

## 2019-10-20 NOTE — Progress Notes (Signed)
Subjective: Tina [name] is a 49 y.o. female patient seen today in office for fungal culture results. Patient has no other pedal complaints at this time.   Patient Active Problem List   Diagnosis Date Noted  . Excessive daytime sleepiness 09/12/2019  . Sleep paralysis 09/12/2019  . Hypnopompic hallucination 09/12/2019  . Abnormal dreams 09/12/2019  . Diabetic macular edema of right eye with proliferative retinopathy associated with type 2 diabetes mellitus ([place]) 07/04/2019  . Stable treated proliferative diabetic retinopathy of left eye determined by examination associated with type 2 diabetes mellitus ([place]) 07/04/2019  . Long-term insulin use ([place]) 04/07/2019  . Depression 04/13/2018  . History of vertigo 03/22/2018  . Laboratory examination 03/22/2018  . Chronic diastolic (congestive) heart failure ([place]) 03/22/2018  . Other hyperlipidemia 02/15/2018  . Vitamin D deficiency 12/31/2017  . Class 2 severe obesity with serious comorbidity and body mass index (BMI) of 39.0 to 39.9 in adult ([place]) 11/18/2017  . Gait disturbance, post-stroke 04/23/2017  . Strain of gluteus medius 04/23/2017  . OSA (obstructive sleep apnea) 10/17/2015  . Periodic limb movement disorder (PLMD) 10/17/2015  . Essential hypertension 05/26/2015  . Diabetes mellitus ([place]) 05/26/2015  . Abdominal wall mass of right lower quadrant 05/25/2015  . Hemiparesis affecting right side as late effect of stroke ([place]) 11/09/2014  . Abdominal wall seroma   . Sepsis due to undetermined organism ([place]) 03/06/2014  . Nausea vomiting and diarrhea 03/06/2014  . Sepsis ([place]) 03/06/2014  . Tension headache 09/01/2013  . Nausea with vomiting ?Gastroparesis? 03/21/2013  . Spastic hemiplegia affecting dominant side ([place]) 02/16/2013  . Recurrent ventral incisional hernia s/p closure/repair w mesh 02/04/2013 01/26/2013  . Constipation, chronic 01/26/2013  . Abdominal pain, chronic, right lower quadrant 06/03/2012  . History of CVA  (cerebrovascular accident) 06/03/2012  . HTN (hypertension) 06/03/2012  . Diabetes mellitus type II, uncontrolled ([place]) 10/16/2011  . Obesity (BMI 30-39.9) 09/02/2011  . Rotator cuff tear 08/25/2011  . Sciatica 06/10/2011  . Paresthesias in right hand 06/10/2011  . Lumbago 05/09/2011  . Paresthesias 05/09/2011    Current Outpatient Medications on File Prior to Visit  Medication Sig Dispense Refill  . allopurinol (ZYLOPRIM) 100 MG tablet Take 100 mg by mouth every evening.    Marland Kitchen amLODipine (NORVASC) 10 MG tablet Take 10 mg by mouth daily.    Marland Kitchen aspirin EC 81 MG tablet Take 81 mg by mouth daily. Swallow whole.    Marland Kitchen atorvastatin (LIPITOR) 10 MG tablet Take 1 tablet (10 mg total) by mouth daily. 30 tablet 0  . buPROPion (WELLBUTRIN SR) 150 MG 12 hr tablet Take 1 tablet (150 mg total) by mouth daily. 30 tablet 0  . cetirizine (ZYRTEC) 10 MG tablet Take 10 mg by mouth daily as needed for allergies.   11  . cloNIDine (CATAPRES) 0.2 MG tablet Take 0.2 mg by mouth 2 (two) times daily.    . Continuous Blood Gluc Receiver (FREESTYLE LIBRE 2 READER) DEVI daily. as directed    . Continuous Blood Gluc Sensor (FREESTYLE LIBRE 2 SENSOR) MISC USE 1 SENSOR EVERY 14 DAYS AS DIRECTED    . DULoxetine (CYMBALTA) 20 MG capsule Take 20 mg by mouth daily. 20 mg am, 40 mg pm    . EPINEPHrine 0.3 mg/0.3 mL IJ SOAJ injection INJECT AS DIRECTED AS NEEDED FOR ALLERGIC REACTION 1 Device 2  . furosemide (LASIX) 40 MG tablet Take 40 mg by mouth.    . gabapentin (NEURONTIN) 300 MG capsule TAKE 2 CAPSULES BY MOUTH  THREE TIMES DAILY 180 capsule 2  . HYDROcodone-acetaminophen (NORCO/VICODIN) 5-325 MG tablet TAKE 1 TABLET BY MOUTH AS NEEDED AT BEDTIME    . insulin aspart (NOVOLOG) 100 UNIT/ML injection Inject 5-15 Units into the skin 3 (three) times daily with meals. Per sliding scale--pt uses Omnipod and gets a basal rate     . Insulin Glargine (BASAGLAR KWIKPEN) 100 UNIT/ML SMARTSIG:18 Unit(s) SUB-Q Every Night    .  omeprazole (PRILOSEC) 40 MG capsule Take 1 capsule by mouth once daily 90 capsule 0  . Semaglutide (OZEMPIC, 0.25 OR 0.5 MG/DOSE, [place]) Inject 0.5 mg into the skin.    Marland Kitchen tiZANidine (ZANAFLEX) 2 MG tablet TAKE 1 TABLET BY MOUTH EVERY NIGHT AT BEDTIME 90 tablet 0  . traMADol (ULTRAM) 50 MG tablet Take 1 tablet by mouth twice daily 60 tablet 0  . Vitamin D, Ergocalciferol, (DRISDOL) 1.25 MG (50000 UNIT) CAPS capsule Take 1 capsule (50,000 Units total) by mouth every 7 (seven) days. 4 capsule 0   No current facility-administered medications on file prior to visit.    Allergies  Allergen Reactions  . Contrast Media [Iodinated Diagnostic Agents] Other (See Comments)    Difficulty breathing  . Iohexol Hives, Nausea And Vomiting and Swelling     Desc: Magnevist-gadolinium-difficulty breathing, throat swelling   . Midazolam Hcl Anaphylaxis    Difficulty breathing  . Shellfish Allergy Anaphylaxis  . Valsartan Swelling  . Metformin And Related Diarrhea and Nausea And Vomiting  . Other Itching    Patient is allergic to all nuts except peanuts.   [place] [Dapagliflozin] Nausea Only and Other (See Comments)    Nausea, UTI, headaches and muscle aches  . Hydralazine   . Spironolactone Other (See Comments)    Elevated potassium   . Avandia [Rosiglitazone Maleate] Hives and Other (See Comments)  . Geodon [Ziprasidone] Other (See Comments)    UNKNOWN  . Kiwi Extract Itching and Swelling  . Latex Itching    Objective: Physical Exam  General: Well developed, nourished, no acute distress, awake, alert and oriented x 3  Integument: Skin is warm, dry and supple bilateral. Nails are mildly elongated thickened and dystrophic with subungual debris, consistent with onychomycosis, 1-5 bilateral.  Nails are short and thickened with subungual debris as above likely onychomycosis, no open lesions or preulcerative lesions present bilateral. Remaining integument unremarkable.  Vasculature:  Dorsalis Pedis  pulse 1/4 bilateral. Posterior Tibial pulse  1/4 bilateral.  Capillary fill time <3 sec 1-5 bilateral. Positive hair growth to the level of the digits. Temperature gradient within normal limits. No varicosities present bilateral. No edema present bilateral.   Neurology: The patient has diminshed sensation measured with a 5.07/10g Semmes Weinstein Monofilament at all pedal sites bilateral . Vibratory sensation diminished bilateral with tuning fork. No Babinski sign present bilateral.   Musculoskeletal: Asymptomatic pes planus and hammertoe pedal deformities noted bilateral. Muscular strength 5/5 in all lower extremity muscular groups bilateral without pain on range of motion . No tenderness with calf compression bilateral.  Fungal culture + fungus and yeast   Assessment and Plan:  Problem List Items Addressed This Visit    None    Visit Diagnoses    Onychodystrophy    -  Primary   Relevant Orders   Hepatic Function Panel   Long-term use of high-risk medication       Relevant Orders   Hepatic Function Panel   Diabetic polyneuropathy associated with type 2 diabetes mellitus ([place])          -  Examined patient -Discussed treatment options for painful mycotic nails -Patient opt for oral ltraconazole with full understanding of medication risks; ordered LFTs for review next visit. Anticipate 12 week course.  -Advised good hygiene habits -Patient to return in 6 weeks for follow up evaluation or sooner if symptoms worsen.  Landis Martins, DPM

## 2019-10-21 ENCOUNTER — Other Ambulatory Visit: Payer: Self-pay | Admitting: Sports Medicine

## 2019-10-21 ENCOUNTER — Telehealth: Payer: Self-pay

## 2019-10-21 MED ORDER — ITRACONAZOLE 100 MG PO CAPS: 100.0000 mg | ORAL_CAPSULE | Freq: Two times a day (BID) | ORAL | 0 refills | Status: DC

## 2019-10-21 NOTE — Progress Notes (Signed)
Changed Itraconazole to 100mg  BID

## 2019-10-21 NOTE — Telephone Encounter (Signed)
Sent Rx to pharmacy  

## 2019-11-01 DIAGNOSIS — M25522 Pain in left elbow: Secondary | ICD-10-CM | POA: Diagnosis not present

## 2019-11-01 DIAGNOSIS — I10 Essential (primary) hypertension: Secondary | ICD-10-CM | POA: Diagnosis not present

## 2019-11-14 ENCOUNTER — Ambulatory Visit: Payer: Federal, State, Local not specified - PPO | Admitting: Neurology

## 2019-11-14 DIAGNOSIS — F518 Other sleep disorders not due to a substance or known physiological condition: Secondary | ICD-10-CM

## 2019-11-14 DIAGNOSIS — R442 Other hallucinations: Secondary | ICD-10-CM

## 2019-11-14 DIAGNOSIS — G4733 Obstructive sleep apnea (adult) (pediatric): Secondary | ICD-10-CM | POA: Diagnosis not present

## 2019-11-14 DIAGNOSIS — G478 Other sleep disorders: Secondary | ICD-10-CM

## 2019-11-14 DIAGNOSIS — G4719 Other hypersomnia: Secondary | ICD-10-CM

## 2019-11-16 DIAGNOSIS — I10 Essential (primary) hypertension: Secondary | ICD-10-CM | POA: Diagnosis not present

## 2019-11-16 DIAGNOSIS — E1142 Type 2 diabetes mellitus with diabetic polyneuropathy: Secondary | ICD-10-CM | POA: Diagnosis not present

## 2019-11-22 NOTE — Progress Notes (Signed)
.  [id number]

## 2019-11-28 NOTE — Addendum Note (Signed)
Addended by: Larey Seat on: 11/28/2019 05:04 PM   Modules accepted: Orders

## 2019-11-28 NOTE — Progress Notes (Signed)
Cc Tina Groves, DO and Dr. [name], MD.  Mild apnea was found and needs to be treated before we can pursue narcolepsy work up-     This HST confirmed a mild degree of sleep apnea and failed to give data of REM sleep dependent OSA.  Review of the hypnogram shows oxygen desaturations being clustered in REM sleep phases but there was no clinically significant total oxygen desaturation time. It appears that the patient was asleep for about 6 hours.   Recommendations:    This mild sleep apnea should be treated in order to allow differentiation of causes of hypersomnia- I will order an autotitration CPAP device with a setting from 5-15 and 2 cm EPR, heated humidification, mask of her choice. -RV with NP in the 3rd month of CPAP therapy- obtain Epworth score again to evaluate success of treatment.

## 2019-11-28 NOTE — Procedures (Signed)
Sleep Study Report   Patient Information     First Name: Tina [name] Last Name: Ica Daye: [id number]  Birth Date: 06/24/2070 Age: 49 Gender: Female  Referring Provider: [name], DO BMI: 38.6 (W=240 lb, H=5' 6'')  Neck Circ.:  16 '' Epworth:  19/24   Sleep Study Information    Study Date: 11/14/19 S/H/A Version: [id number] / 4.2.1023 / 79  History:    Melora VIOLET SEABURY was seen on 09-12-2019, she was last seen by Dr [name], MD, on 02-10-2017, and has a medical history of Allergy, Anemia, Anxiety, Back pain, chronic, Blood transfusion, Cerebral thrombosis with cerebral infarction [facility]) (JUNE 2011), Constipation, Depression, Diabetes mellitus, Diabetic neuropathy ([place]), Edema, lower extremity, Endometriosis, Fatty liver, GERD (gastroesophageal reflux disease), H/O eye surgery, Headache(784.0), Hernia, incisional, RLQ, s/p lap repair Sep 2013 (09/02/2011), History of vertigo (03/22/2018), migraines (10/19/2011), Hypertension, Leg pain, right, Multiple food allergies, Panic disorder without agoraphobia, Rash, Restless leg syndrome, Right rotator cuff tear, SBO (small bowel obstruction) ([place]) (06/03/2012), Shortness of breath, Spastic hemiplegia affecting dominant side ([place]),  Ventral hernia. Here for hypersomnia, Epworth 19 points.    Summary & Diagnosis:     This HST confirmed a mild degree of sleep apnea and failed to give data of REM sleep dependent OSA.  Review of the hypnogram shows oxygen desaturations being clustered in REM sleep phases but there was no clinically significant total oxygen desaturation time. It appears that the patient was asleep for about 6 hours.   Recommendations:     This mild sleep apnea should be treated in order to allow differentiation of causes of hypersomnia- I will order an autotitration CPAP device with a setting from 5-15 and 2 cm EPR, heated humidification, mask of her choice. -RV with NP in the 3rd month of CPAP therapy- obtain Epworth score again to evaluate success  of treatment.   Interpreting Physician: Larey Seat, MD  Cc Dr [name], Dr Theda Sers.           Sleep Summary  Oxygen Saturation Statistics   Start Study Time: End Study Time: Total Recording Time:  9:14:02 PM 7:02:47 AM   9 h, 48 min  Total Sleep Time % REM of Sleep Time:  6 h, 18 min  7.0    Mean: 94 Minimum: 90 Maximum: 99  Mean of Desaturations Nadirs (%):   93  Oxygen Desaturation. %: 4-9 10-20 >20 Total  Events Number Total  16 100.0  0 0.0  0 0.0  16 100.0  Oxygen Saturation: <90 <=88 <85 <80 <70  Duration (minutes): Sleep % 0.0 0.0 0.0 0.0 0.0 0.0 0.0 0.0 0.0 0.0     Respiratory Indices      Total Events REM NREM All Night  pRDI: pAHI 3%: ODI 4%: pAHIc 3%: % CSR: pAHI 4%:  66  50  16  0 0.0 18 N/A N/A N/A N/A N/A N/A N/A N/A 10.8 8.2 2.6 0.0 3.0       Pulse Rate Statistics during Sleep (BPM)      Mean: 71 Minimum: 61 Maximum: 94    Indices are calculated using technically valid sleep time of 6 h, 6 min.                 pAHI=8.2                          Mild              Moderate  Severe                                                 5              15                    30   Body Position Statistics  Position Supine Prone Right Left Non-Supine  Sleep (min) 291.2 0.0 67.5 20.0 87.5  Sleep % 76.9 0.0 17.8 5.3 23.1  pRDI 11.9 N/A 4.5 17.4 7.1  pAHI 3% 8.9 N/A 2.7 17.4 5.7  ODI 4% 2.6 N/A 0.9 10.5 2.8            Left   Right  Supine    Snoring Statistics Snoring Level (dB) >40 >50 >60 >70 >80 >Threshold (45)  Sleep (min) 151.9 3.3 0.5 0.0 0.0 9.5          Sleep % 40.1 0.9 0.1 0.0 0.0 2.5    Mean: 41 dB Sleep Stages Chart

## 2019-11-29 ENCOUNTER — Telehealth: Payer: Self-pay | Admitting: Neurology

## 2019-11-29 NOTE — Telephone Encounter (Signed)
-----   Message from Larey Seat, MD sent at 11/28/2019  5:04 PM EST ----- Cc Lucious Groves, DO and Dr. [name], MD.  Mild apnea was found and needs to be treated before we can pursue narcolepsy work up-     This HST confirmed a mild degree of sleep apnea and failed to give data of REM sleep dependent OSA.  Review of the hypnogram shows oxygen desaturations being clustered in REM sleep phases but there was no clinically significant total oxygen desaturation time. It appears that the patient was asleep for about 6 hours.   Recommendations:    This mild sleep apnea should be treated in order to allow differentiation of causes of hypersomnia- I will order an autotitration CPAP device with a setting from 5-15 and 2 cm EPR, heated humidification, mask of her choice. -RV with NP in the 3rd month of CPAP therapy- obtain Epworth score again to evaluate success of treatment.

## 2019-11-29 NOTE — Telephone Encounter (Signed)
Called patient to discuss sleep study results. No answer at this time. LVM for the patient to call back.   

## 2019-12-01 ENCOUNTER — Encounter: Payer: Self-pay | Admitting: Sports Medicine

## 2019-12-01 ENCOUNTER — Other Ambulatory Visit: Payer: Self-pay

## 2019-12-01 ENCOUNTER — Ambulatory Visit (INDEPENDENT_AMBULATORY_CARE_PROVIDER_SITE_OTHER): Payer: Federal, State, Local not specified - PPO | Admitting: Sports Medicine

## 2019-12-01 DIAGNOSIS — E1142 Type 2 diabetes mellitus with diabetic polyneuropathy: Secondary | ICD-10-CM | POA: Diagnosis not present

## 2019-12-01 DIAGNOSIS — L603 Nail dystrophy: Secondary | ICD-10-CM

## 2019-12-01 MED ORDER — FLUCONAZOLE 100 MG PO TABS: 100.0000 mg | ORAL_TABLET | Freq: Every day | ORAL | 0 refills | Status: DC

## 2019-12-01 MED ORDER — KETOCONAZOLE 2 % EX CREA: 1.0000 "application " | TOPICAL_CREAM | Freq: Every day | CUTANEOUS | 5 refills | Status: DC

## 2019-12-01 NOTE — Patient Outreach (Signed)
  [place] [facility]) Care Management Chronic Special Needs Program    12/01/2019  Name: Tina [name], DOB: 1970/05/03  MRN: [id number]   Ms. Grover Claus is enrolled in a chronic special needs plan for Diabetes.  [employer] Care Management Team has assumed care and services for this member. Case closed by [place] RN, [facility] [place] Management [phone]
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
## 2019-12-06 ENCOUNTER — Encounter: Payer: Self-pay | Admitting: Neurology

## 2019-12-06 NOTE — Telephone Encounter (Signed)
Made 2nd attempt to the patient. There was no answer. LVM for pt to call back. Will send a mychart message as a 3rd attempt

## 2019-12-10 ENCOUNTER — Other Ambulatory Visit: Payer: Self-pay | Admitting: Physical Medicine & Rehabilitation

## 2019-12-14 ENCOUNTER — Encounter: Payer: Self-pay | Admitting: Neurology

## 2019-12-17 IMAGING — US US ABDOMEN COMPLETE
1 series · 13 of 25 positions shown · non-contrast
Comparison: Abdominal CT scan November 26, 2016

CLINICAL DATA: One year history of right upper quadrant pain
associated with nausea. History of diabetes and hypertension.

EXAM:
ABDOMEN ULTRASOUND COMPLETE

[Series 1: us abdomen complete · 0.19mm/px · 13 of 99 slices shown]
[im 1/99]
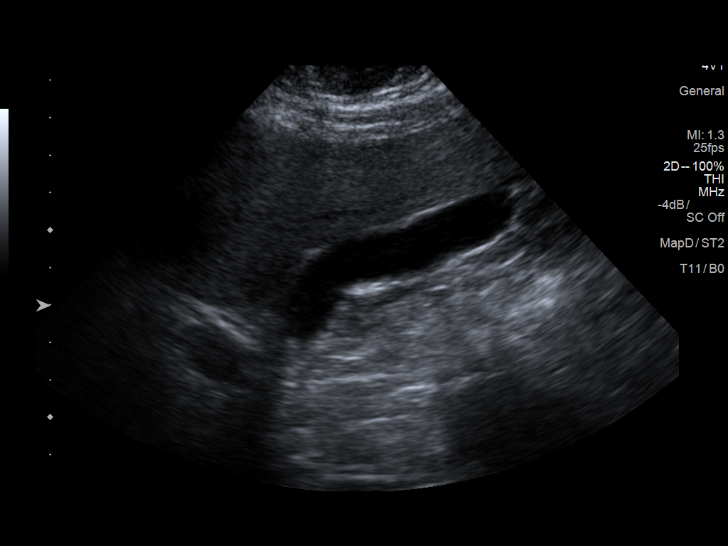
[im 9/99]
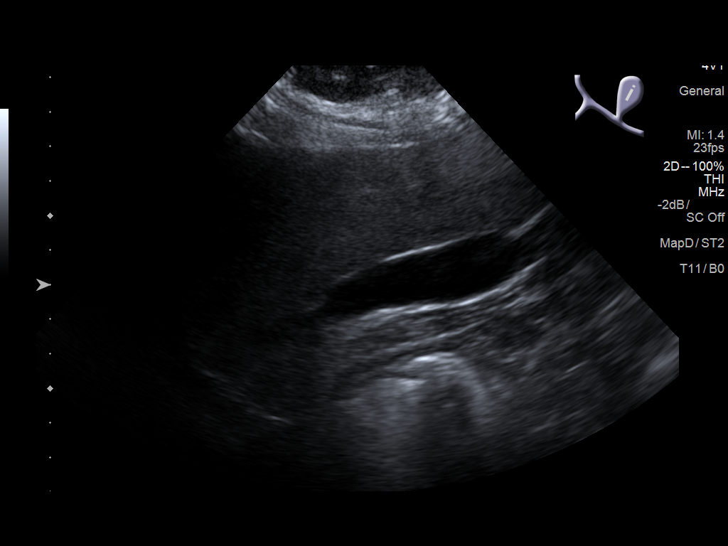
[im 17/99]
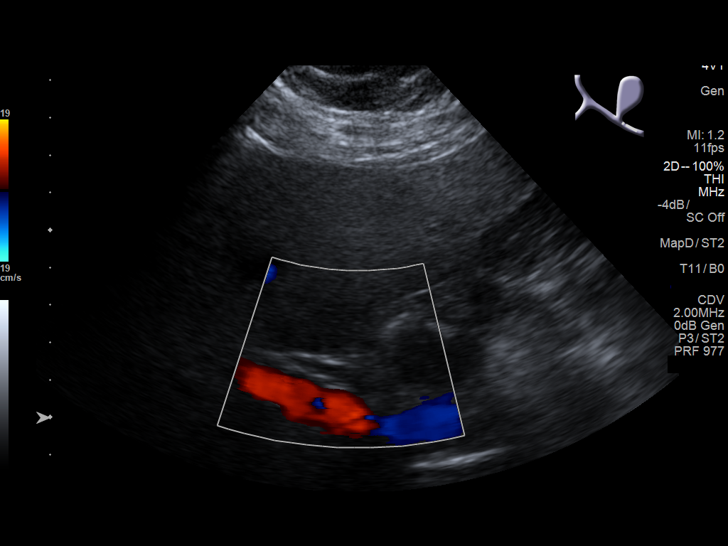
[im 25/99]
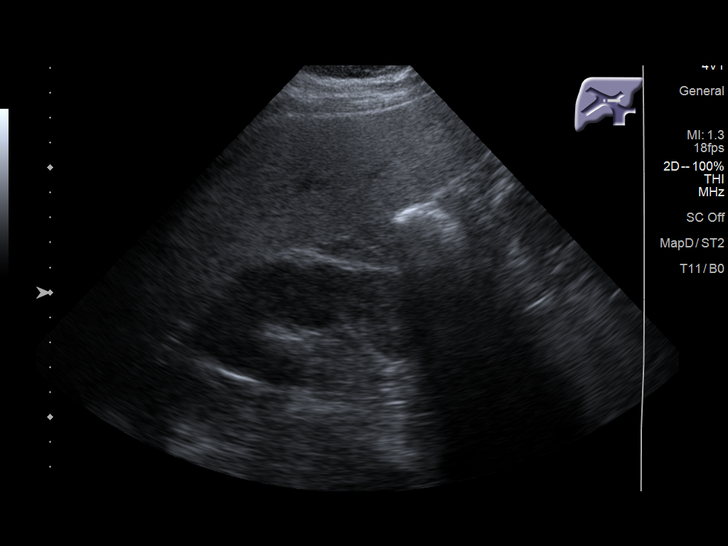
[im 33/99]
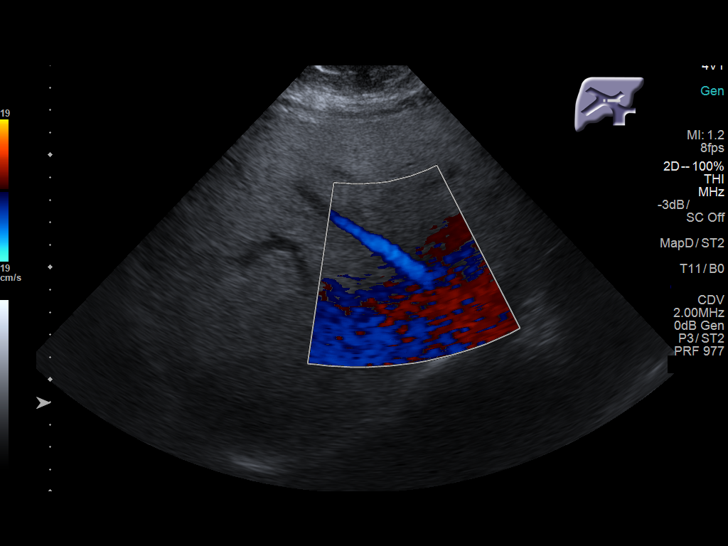
[im 41/99]
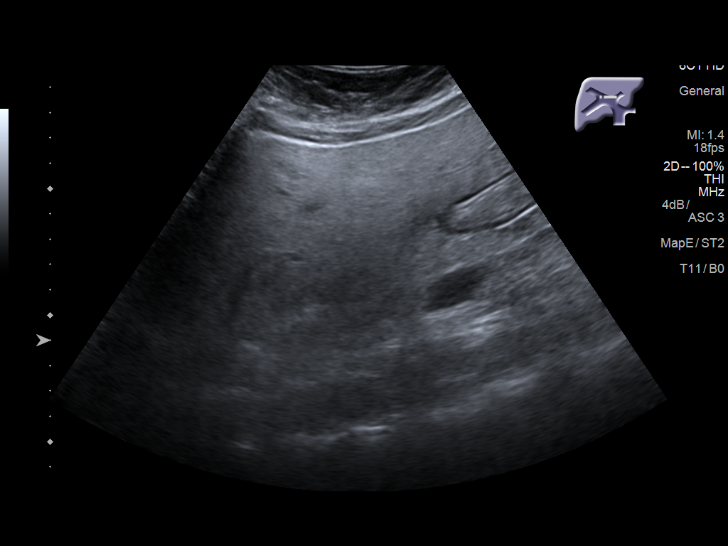
[im 50/99]
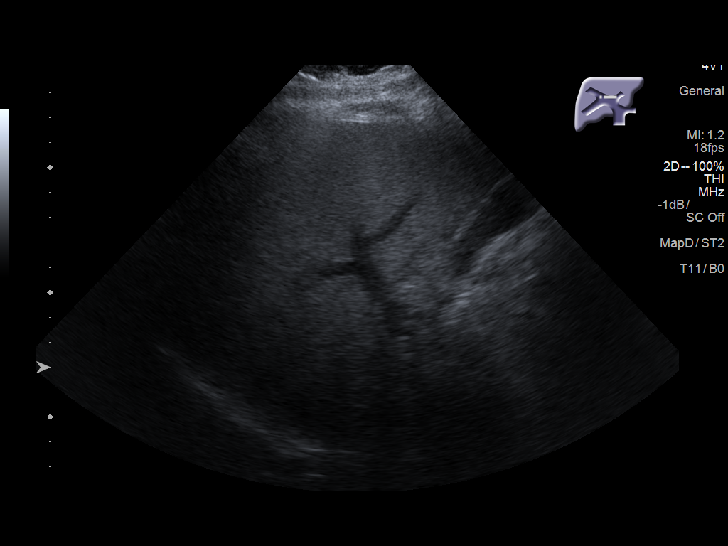
[im 58/99]
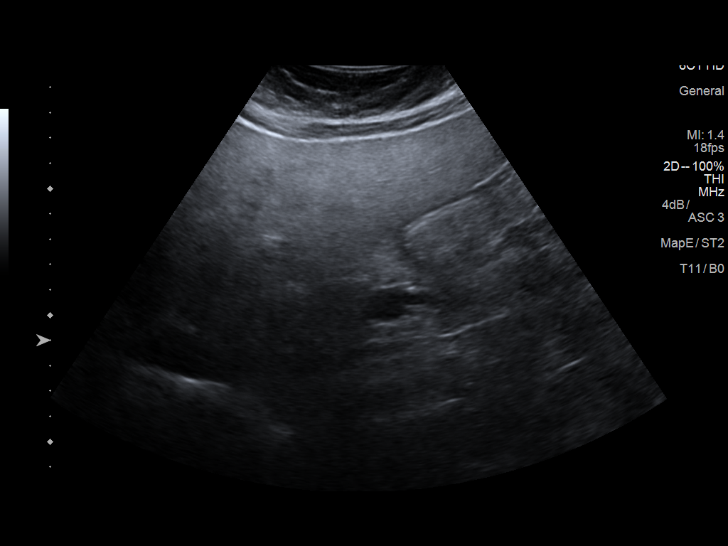
[im 66/99]
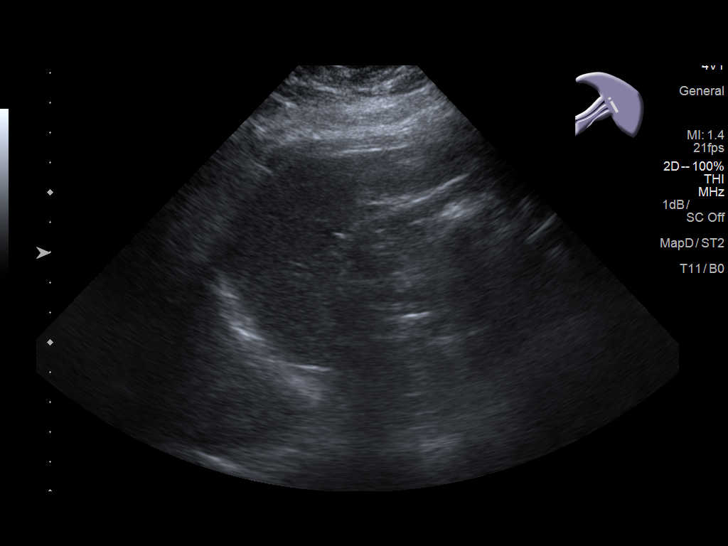
[im 74/99]
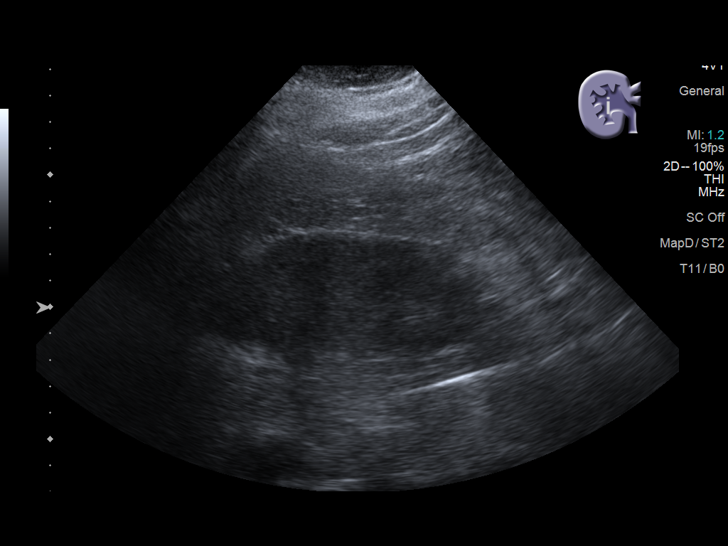
[im 82/99]
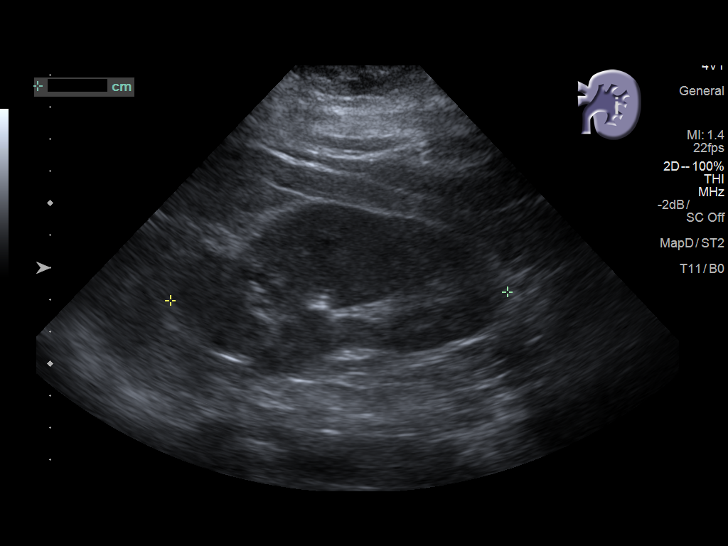
[im 90/99]
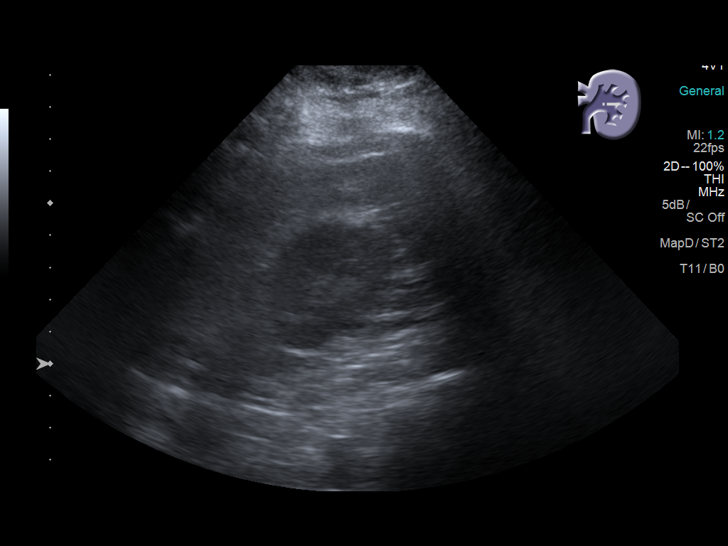
[im 99/99]
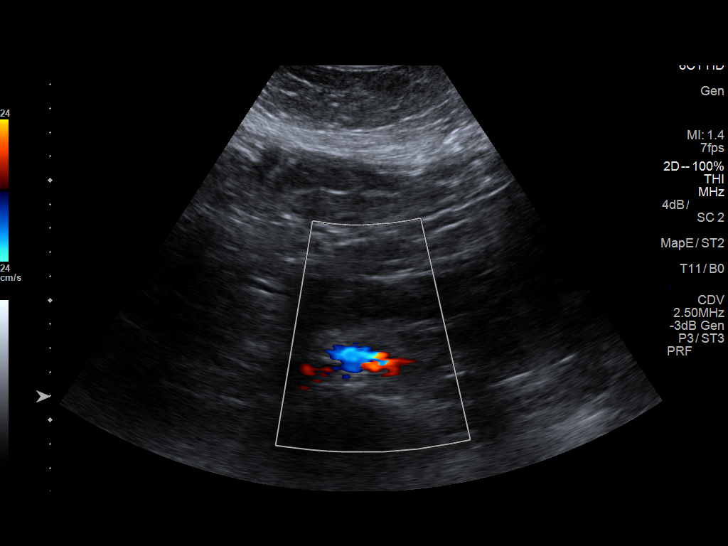

[13 of 25 positions shown; findings below may reference images not displayed]

FINDINGS: Gallbladder: No gallstones or wall thickening visualized. No
sonographic Murphy sign noted by sonographer.

Common bile duct: Diameter: 4.7 mm

Liver: The hepatic echotexture is increased. There is likely focal
fatty sparing adjacent to the gallbladder. The surface contour of
the liver is smooth. No solid-appearing masses are observed. There
is no intrahepatic ductal dilation. Portal vein is patent on color
Doppler imaging with normal direction of blood flow towards the
liver.

IVC: No abnormality visualized.

Pancreas: The pancreatic head and body appear normal. The pancreatic
tail was obscured by bowel gas.

Spleen: Size and appearance within normal limits.

Right Kidney: Length: 12.6 cm. Echogenicity within normal limits. No
mass or hydronephrosis visualized.

Left Kidney: Length: 10.5 cm. Echogenicity within normal limits. No
mass or hydronephrosis visualized.

Abdominal aorta: The aorta exhibits a normal tapering caliber.

Other findings: There is no ascites.
IMPRESSION: No gallstones or sonographic evidence of acute cholecystitis. If
there are clinical concerns of chronic cholecystitis, a nuclear
medicine hepatobiliary scan with gallbladder ejection fraction
determination may be useful.

Increased hepatic echotexture most compatible with fatty
infiltrative change.

## 2019-12-28 ENCOUNTER — Other Ambulatory Visit: Payer: Self-pay | Admitting: Physical Medicine & Rehabilitation

## 2020-01-02 NOTE — Telephone Encounter (Signed)
Pt returned call and I was able to review the information with her. Advised of the sleep study findings and Dr Dohmeier recommendation. At this time the patient would like to hold off on starting treatment. Will wait for the patient to call us back.

## 2020-01-03 ENCOUNTER — Encounter (INDEPENDENT_AMBULATORY_CARE_PROVIDER_SITE_OTHER): Payer: Self-pay | Admitting: Ophthalmology

## 2020-01-03 ENCOUNTER — Other Ambulatory Visit: Payer: Self-pay

## 2020-01-03 ENCOUNTER — Ambulatory Visit (INDEPENDENT_AMBULATORY_CARE_PROVIDER_SITE_OTHER): Payer: Federal, State, Local not specified - PPO | Admitting: Ophthalmology

## 2020-01-03 ENCOUNTER — Encounter (INDEPENDENT_AMBULATORY_CARE_PROVIDER_SITE_OTHER): Payer: Federal, State, Local not specified - PPO | Admitting: Ophthalmology

## 2020-01-03 DIAGNOSIS — E113511 Type 2 diabetes mellitus with proliferative diabetic retinopathy with macular edema, right eye: Secondary | ICD-10-CM

## 2020-01-03 DIAGNOSIS — H35073 Retinal telangiectasis, bilateral: Secondary | ICD-10-CM | POA: Diagnosis not present

## 2020-01-03 DIAGNOSIS — G4733 Obstructive sleep apnea (adult) (pediatric): Secondary | ICD-10-CM | POA: Diagnosis not present

## 2020-01-03 NOTE — Progress Notes (Signed)
01/03/2020     CHIEF COMPLAINT Patient presents for Retina Follow Up (6 Month Diabetic F/U OU//Pt denies noticeable changes to [place] OU since last visit. Pt denies ocular pain, flashes of light, or floaters OU. //A1c: cannot recall, 8.1?/LBS: 135 last night)   HISTORY OF PRESENT ILLNESS: Tina [name] is a 49 y.o. female who presents to the clinic today for:   HPI    Retina Follow Up    Patient presents with  Diabetic Retinopathy.  In both eyes.  This started 6 months ago.  Severity is mild.  Duration of 6 months.  Since onset it is stable. Additional comments: 6 Month Diabetic F/U OU  Pt denies noticeable changes to [place] OU since last visit. Pt denies ocular pain, flashes of light, or floaters OU.   A1c: cannot recall, 8.1? LBS: 135 last night       Last edited by Ileana Roup, COA on 01/03/2020 10:27 AM. (History)      Referring physician: Irena Reichmann, DO [address] [place],  [place] [address]  HISTORICAL INFORMATION:   Selected notes from the MEDICAL RECORD NUMBER    Lab Results  Component Value Date   HGBA1C 8.4 08/02/2019     CURRENT MEDICATIONS: No current outpatient medications on file. (Ophthalmic Drugs)   No current facility-administered medications for this visit. (Ophthalmic Drugs)   Current Outpatient Medications (Other)  Medication Sig  . allopurinol (ZYLOPRIM) 100 MG tablet Take 100 mg by mouth every evening.  Marland Kitchen amLODipine (NORVASC) 10 MG tablet Take 10 mg by mouth daily.  Marland Kitchen aspirin EC 81 MG tablet Take 81 mg by mouth daily. Swallow whole.  Marland Kitchen atorvastatin (LIPITOR) 10 MG tablet Take 1 tablet (10 mg total) by mouth daily.  Marland Kitchen buPROPion (WELLBUTRIN SR) 150 MG 12 hr tablet Take 1 tablet (150 mg total) by mouth daily.  . cetirizine (ZYRTEC) 10 MG tablet Take 10 mg by mouth daily as needed for allergies.   . cloNIDine (CATAPRES) 0.2 MG tablet Take 0.2 mg by mouth 2 (two) times daily.  . Continuous Blood Gluc Receiver (FREESTYLE LIBRE 2  READER) DEVI daily. as directed  . Continuous Blood Gluc Sensor (FREESTYLE LIBRE 2 SENSOR) MISC USE 1 SENSOR EVERY 14 DAYS AS DIRECTED  . DULoxetine (CYMBALTA) 20 MG capsule Take 20 mg by mouth daily. 20 mg am, 40 mg pm  . EPINEPHrine 0.3 mg/0.3 mL IJ SOAJ injection INJECT AS DIRECTED AS NEEDED FOR ALLERGIC REACTION  . fluconazole (DIFLUCAN) 100 MG tablet Take 1 tablet (100 mg total) by mouth daily.  . furosemide (LASIX) 40 MG tablet Take 40 mg by mouth.  . gabapentin (NEURONTIN) 300 MG capsule TAKE 2 CAPSULES BY MOUTH THREE TIMES DAILY  . insulin aspart (NOVOLOG) 100 UNIT/ML injection Inject 5-15 Units into the skin 3 (three) times daily with meals. Per sliding scale--pt uses Omnipod and gets a basal rate   . Insulin Glargine (BASAGLAR KWIKPEN) 100 UNIT/ML SMARTSIG:18 Unit(s) SUB-Q Every Night  . itraconazole (SPORANOX) 100 MG capsule Take 1 capsule (100 mg total) by mouth 2 (two) times daily.  Marland Kitchen ketoconazole (NIZORAL) 2 % cream Apply 1 application topically daily.  Marland Kitchen omeprazole (PRILOSEC) 40 MG capsule Take 1 capsule by mouth once daily  . Semaglutide (OZEMPIC, 0.25 OR 0.5 MG/DOSE, [place]) Inject 0.5 mg into the skin.  Marland Kitchen tiZANidine (ZANAFLEX) 2 MG tablet TAKE 1 TABLET BY MOUTH ONCE DAILY AT NIGHT AT BEDTIME  . traMADol (ULTRAM) 50 MG tablet Take 1 tablet  by mouth twice daily  . Vitamin D, Ergocalciferol, (DRISDOL) 1.25 MG (50000 UNIT) CAPS capsule Take 1 capsule (50,000 Units total) by mouth every 7 (seven) days.   No current facility-administered medications for this visit. (Other)      REVIEW OF SYSTEMS:    ALLERGIES Allergies  Allergen Reactions  . Contrast Media [Iodinated Diagnostic Agents] Other (See Comments)    Difficulty breathing  . Iohexol Hives, Nausea And Vomiting and Swelling     Desc: Magnevist-gadolinium-difficulty breathing, throat swelling   . Midazolam Hcl Anaphylaxis    Difficulty breathing  . Shellfish Allergy Anaphylaxis  . Valsartan Swelling  . Metformin And  Related Diarrhea and Nausea And Vomiting  . Other Itching    Patient is allergic to all nuts except peanuts.   Wilder Glade [Dapagliflozin] Nausea Only and Other (See Comments)    Nausea, UTI, headaches and muscle aches  . Hydralazine   . Spironolactone Other (See Comments)    Elevated potassium   . Avandia [Rosiglitazone Maleate] Hives and Other (See Comments)  . Geodon [Ziprasidone] Other (See Comments)    UNKNOWN  . Kiwi Extract Itching and Swelling  . Latex Itching    PAST MEDICAL HISTORY Past Medical History:  Diagnosis Date  . Allergy   . Anemia    DURING MENSES--HAS HEAVY BLEEDING WITH PERODS  . Anxiety   . Back pain, chronic    "ongoing"  . Blood transfusion    IN 2012  AFTER C -SECTION  . Cerebral thrombosis with cerebral infarction [facility]) JUNE 2011   RIGHT SIDED WEAKNESS ( ARM AND LEG ) AND SPASMS-remains with slight weakness and vertigo.  . Constipation   . Depression   . Diabetes mellitus   . Diabetic neuropathy ([place])    BOTH FEET --COMES AND GOES  . Edema, lower extremity   . Endometriosis   . Fatty liver   . GERD (gastroesophageal reflux disease)    with pregnancy  . H/O eye surgery   . Headache(784.0)    MIGRAINES--NOT REALLY HEADACHE-MORE LIKE PRESSURE SENSATION IN HEAD-FEELS DIZZIY AND  FAINT AS THE PRESSURE RESOLVES  . Hernia, incisional, RLQ, s/p lap repair Sep 2013 09/02/2011  . History of vertigo 03/22/2018  . Hx of migraines 10/19/2011  . Hypertension   . Leg pain, right    "like bad Charley horse"  . Multiple food allergies   . Panic disorder without agoraphobia   . Rash    HANDS, ARMS --STATES HX OF RASH EVER SINCE CHILDBIRTH/PREGNANCY.  STATES THE RASH OFTEN OCCURS WHEN SHE IS REALLY STRESSED."goes and comes-presently left ring finger"  . Restless leg syndrome    DIAGNOSED BY SLEEP STUDY - PT TOLD SHE DID NOT HAVE SLEEP APNEA  . Right rotator cuff tear    PAIN IN RIGHT SHOULDER  . SBO (small bowel obstruction) ([place]) 06/03/2012  . Shortness of  breath   . Spastic hemiplegia affecting dominant side ([employer])   . Stomach problems   . Stroke ([place])   . Ventral hernia    RIGHT LOWER QUADRANT-CAUSING SOME PAIN  . Weakness of right side of body    Past Surgical History:  Procedure Laterality Date  . APPLICATION OF WOUND VAC N/A 05/25/2015   Procedure: APPLICATION OF WOUND VAC;  Surgeon: Michael Boston, MD;  Location: [facility];  Service: General;  Laterality: N/A;  . CESAREAN SECTION  2012  . COLONOSCOPY    . DIAGNOSTIC LAPAROSCOPY    . ESOPHAGOGASTRODUODENOSCOPY N/A 06/03/2012   Procedure: ESOPHAGOGASTRODUODENOSCOPY (EGD);  Surgeon: [name] Craver, MD;  Location: [facility] ENDOSCOPY;  Service: Endoscopy;  Laterality: N/A;  . EXCISION MASS ABDOMINAL N/A 05/25/2015   Procedure: ABDOMINAL WALL EXPLORATION EXCISION OF SEROMA REMOVAL OF REDUNDANT SKIN ;  Surgeon: Michael Boston, MD;  Location: [facility];  Service: General;  Laterality: N/A;  . EYE SURGERY     Eye laser for vessel hemorrhaging  . fybroid removal    . HERNIA REPAIR  10/03/11   ventral hernia repair  . INSERTION OF MESH N/A 02/04/2013   Procedure: INSERTION OF MESH;  Surgeon: Adin Hector, MD;  Location: [facility];  Service: General;  Laterality: N/A;  . UMBILICAL HERNIA REPAIR N/A 02/04/2013   Procedure: LAPAROSCOPIC ventral wall hernia repair LAPAROSCOPIC LYSIS OF ADHESIONS laparoscopic exploration of abdomen ;  Surgeon: Adin Hector, MD;  Location: [facility];  Service: General;  Laterality: N/A;  . UPPER GASTROINTESTINAL ENDOSCOPY    . URETER REVISION     Bilateral "twisted"  . UTERINE FIBROID SURGERY     2 SURGERIES FOR FIBROIDS  . VENTRAL HERNIA REPAIR  10/03/2011   Procedure: LAPAROSCOPIC VENTRAL HERNIA;  Surgeon: Adin Hector, MD;  Location: [facility];  Service: General;  Laterality: N/A;    FAMILY HISTORY Family History  Problem Relation Age of Onset  . Diabetes Father   . Kidney disease Father   . Depression Father   . Drug abuse Father   . Allergic rhinitis Mother   . Eczema  Mother   . Urticaria Mother   . Depression Mother   . Anxiety disorder Mother   . Bipolar disorder Mother   . Alcoholism Mother   . Drug abuse Mother   . Eating disorder Mother   . Diabetes Maternal Grandmother   . Hyperlipidemia Paternal Grandmother   . Stroke Paternal Grandmother   . Eczema Sister   . Urticaria Sister   . Colon cancer Paternal Uncle   . Other Neg Hx   . Angioedema Neg Hx   . Asthma Neg Hx   . Colon polyps Neg Hx   . Esophageal cancer Neg Hx   . Rectal cancer Neg Hx   . Stomach cancer Neg Hx     SOCIAL HISTORY Social History   Tobacco Use  . Smoking status: Never Smoker  . Smokeless tobacco: Never Used  Vaping Use  . Vaping Use: Never used  Substance Use Topics  . Alcohol use: No    Alcohol/week: 0.0 standard drinks  . Drug use: No         OPHTHALMIC EXAM:  Base Eye Exam    Visual Acuity (ETDRS)      Right Left   Dist [place] 20/20 -2 20/40 +1   Dist ph [place]  NI       Tonometry (Tonopen, 10:27 AM)      Right Left   Pressure 17 15       Pupils      Pupils Dark Light Shape React APD   Right PERRL 4 3 Round Brisk None   Left PERRL 4 3 Round Brisk None       Visual Fields (Counting fingers)      Left Right    Full Full       Extraocular Movement      Right Left    Full Full       Neuro/Psych    Oriented x3: Yes   Mood/Affect: Normal       Dilation    Both eyes: 1.0%  Mydriacyl, 2.5% Phenylephrine @ 10:32 AM        Slit Lamp and Fundus Exam    External Exam      Right Left   External Normal Normal       Slit Lamp Exam      Right Left   Lids/Lashes Normal Normal   Conjunctiva/Sclera White and quiet White and quiet   Cornea Clear Clear   Anterior Chamber Deep and quiet Deep and quiet   Iris Round and reactive Round and reactive   Lens Centered Posterior chamber intraocular lens, 2+ Posterior capsular opacification Posterior chamber intraocular lens   Anterior Vitreous Normal Normal       Fundus Exam      Right Left    Posterior Vitreous Vitrectomized Vitrectomized   Disc Normal Normal   C/D Ratio 0.2 0.1   Macula Microaneurysms, no exudates, no macular thickening clinical no exudates, no macular thickening   Vessels PDR-quiet PDR-quiet   Periphery Good PRP, a attached Good PRP, a attached          IMAGING AND PROCEDURES  Imaging and Procedures for 01/03/20           ASSESSMENT/PLAN:  OSA (obstructive sleep apnea) Dr. Luciana Axeankin, reviewed the findings of the mild sleep apnea documented by sleep study, because of her hypoxic retinal condition, from advanced proliferative diabetic retinopathy, now quiescent, I do recommend therapy to potentially slow stabilize the macular perfusion and prevent nightly hypoxia, which could account for this form of macular edema seen on the temporal aspect of the fovea OU (macular telangiectasia)  These findings were reviewed with the patient.  Dr. Vickey Hugerohmeier is familiar with my work.  I recommended consideration of nightly CPAP usage on an ongoing basis to him potentially assist in the patient since of wellbeing, improving, but more importantly protecting the macular perfusion status   after a brush with blindness from previous proliferative diabetic retinopathy  Type 2 macular telangiectasis, bilateral Condition suspected for some time because the temporal location of CME around the foveal avascular zone, OU.  With confirmed mild sleep apnea testing for other reasons) this could in fact be macular telangiectasis.  I will ask and instruct the patient to return to see us here in the office within 2 weeks after starting CPAP use, at which time we will perform an undilated examination and OCT investigation of the macula to look for improvement post onset of use of CPAP      ICD-10-CM   1. Diabetic macular edema of right eye with proliferative retinopathy associated with type 2 diabetes mellitus (HCC)  E11.3511 OCT, Retina - OU - Both Eyes  2. OSA (obstructive sleep  apnea)  G47.33   3. Type 2 macular telangiectasis, bilateral  H35.073     1.  Noncentral involved cystoid macular edema temporal aspect of the foveal avascular zone of each eye, most likely not diabetic related but diabetic exacerbated, macular telangiectasis from now proven mild sleep apnea.  Will monitor OU to look for spontaneous improvement within a short time post onset of use of CPAP.  2.  Patient was informed of these results and the potential for looking improvement shortly after CPAP and she will contact us promptly when she has the beginning date for onset of use of CPAP  3.  Ophthalmic Meds Ordered this visit:  No orders of the defined types were placed in this encounter.      Return in about 6 months (around 07/03/2020) for DILATE OU, OCT,,,  patient may call at any time to be seen 2 weeks after onset of CPAP use.  There are no Patient Instructions on file for this visit.   Explained the diagnoses, plan, and follow up with the patient and they expressed understanding.  Patient expressed understanding of the importance of proper follow up care.   Clent Demark Nancee Brownrigg M.D. Diseases & Surgery of the Retina and Vitreous Retina & Diabetic [place] 01/03/20     Abbreviations: M myopia (nearsighted); A astigmatism; H hyperopia (farsighted); P presbyopia; Mrx spectacle prescription;  CTL contact lenses; OD right eye; OS left eye; OU both eyes  XT exotropia; ET esotropia; PEK punctate epithelial keratitis; PEE punctate epithelial erosions; DES dry eye syndrome; MGD meibomian gland dysfunction; ATs artificial tears; PFAT's preservative free artificial tears; [place] nuclear sclerotic cataract; PSC posterior subcapsular cataract; ERM epi-retinal membrane; PVD posterior vitreous detachment; RD retinal detachment; DM diabetes mellitus; DR diabetic retinopathy; NPDR non-proliferative diabetic retinopathy; PDR proliferative diabetic retinopathy; CSME clinically significant macular edema; DME  diabetic macular edema; dbh dot blot hemorrhages; CWS cotton wool spot; POAG primary open angle glaucoma; C/D cup-to-disc ratio; HVF humphrey visual field; GVF goldmann visual field; OCT optical coherence tomography; IOP intraocular pressure; BRVO Branch retinal vein occlusion; CRVO central retinal vein occlusion; CRAO central retinal artery occlusion; BRAO branch retinal artery occlusion; RT retinal tear; SB scleral buckle; PPV pars plana vitrectomy; VH Vitreous hemorrhage; PRP panretinal laser photocoagulation; IVK intravitreal kenalog; VMT vitreomacular traction; MH Macular hole;  NVD neovascularization of the disc; NVE neovascularization elsewhere; AREDS age related eye disease study; ARMD age related macular degeneration; POAG primary open angle glaucoma; EBMD epithelial/anterior basement membrane dystrophy; ACIOL anterior chamber intraocular lens; IOL intraocular lens; PCIOL posterior chamber intraocular lens; Phaco/IOL phacoemulsification with intraocular lens placement; [place] photorefractive keratectomy; LASIK laser assisted in situ keratomileusis; HTN hypertension; DM diabetes mellitus; COPD chronic obstructive pulmonary disease

## 2020-01-03 NOTE — Assessment & Plan Note (Signed)
Condition suspected for some time because the temporal location of CME around the foveal avascular zone, OU.  With confirmed mild sleep apnea testing for other reasons) this could in fact be macular telangiectasis.  I will ask and instruct the patient to return to see [name] here in the office within 2 weeks after starting CPAP use, at which time we will perform an undilated examination and OCT investigation of the macula to look for improvement post onset of use of CPAP

## 2020-01-03 NOTE — Assessment & Plan Note (Addendum)
Dr. Zadie Rhine, reviewed the findings of the mild sleep apnea documented by sleep study, because of her hypoxic retinal condition, from advanced proliferative diabetic retinopathy, now quiescent, I do recommend therapy to potentially slow stabilize the macular perfusion and prevent nightly hypoxia, which could account for this form of macular edema seen on the temporal aspect of the fovea OU (macular telangiectasia)  These findings were reviewed with the patient.  Dr. Brett Fairy is familiar with my work.  I recommended consideration of nightly CPAP usage on an ongoing basis to him potentially assist in the patient since of wellbeing, improving, but more importantly protecting the macular perfusion status   after a brush with blindness from previous proliferative diabetic retinopathy

## 2020-02-02 ENCOUNTER — Ambulatory Visit: Payer: Federal, State, Local not specified - PPO | Admitting: Sports Medicine

## 2020-02-03 DIAGNOSIS — Z20822 Contact with and (suspected) exposure to covid-19: Secondary | ICD-10-CM | POA: Diagnosis not present

## 2020-02-04 ENCOUNTER — Other Ambulatory Visit: Payer: Self-pay | Admitting: Physical Medicine & Rehabilitation

## 2020-02-07 ENCOUNTER — Encounter: Payer: Federal, State, Local not specified - PPO | Admitting: Physical Medicine & Rehabilitation

## 2020-02-15 ENCOUNTER — Telehealth: Payer: Self-pay | Admitting: Neurology

## 2020-02-15 NOTE — Telephone Encounter (Signed)
[employer] Manuela Schwartz) called, Pt has decided to go ahead with placing the order for CPAP machine. You can contact me or the patient.  Contact no: [phone]

## 2020-02-15 NOTE — Telephone Encounter (Signed)
Called the patient to verify that she did want to move forward with getting set up with the CPAP.  Patient states she does want to move forward.  I will send the order to [facility].  Advised there is a [place] surgeon on CPAP's and that it is taking longer than normal to get the patient set up.  Advised the pt that they will contact her when they are ready to get her set up.  Instructed the patient to inform us when she is set up with the machine, so that we may get her scheduled for the initial CPAP visit of 31 to 90 days from the date she gets the machine.  Patient verbalized understanding and was appreciative

## 2020-02-20 DIAGNOSIS — I10 Essential (primary) hypertension: Secondary | ICD-10-CM | POA: Diagnosis not present

## 2020-02-20 DIAGNOSIS — M79602 Pain in left arm: Secondary | ICD-10-CM | POA: Diagnosis not present

## 2020-02-27 ENCOUNTER — Other Ambulatory Visit: Payer: Self-pay | Admitting: Physical Medicine & Rehabilitation

## 2020-02-28 ENCOUNTER — Other Ambulatory Visit: Payer: Self-pay

## 2020-02-28 ENCOUNTER — Encounter
Payer: Federal, State, Local not specified - PPO | Attending: Physical Medicine & Rehabilitation | Admitting: Physical Medicine & Rehabilitation

## 2020-02-28 ENCOUNTER — Encounter: Payer: Self-pay | Admitting: Physical Medicine & Rehabilitation

## 2020-02-28 VITALS — BP 165/95 | HR 82 | Temp 98.6°F | Ht 65.5 in | Wt 243.0 lb

## 2020-02-28 DIAGNOSIS — Z5181 Encounter for therapeutic drug level monitoring: Secondary | ICD-10-CM | POA: Insufficient documentation

## 2020-02-28 DIAGNOSIS — G894 Chronic pain syndrome: Secondary | ICD-10-CM | POA: Diagnosis not present

## 2020-02-28 DIAGNOSIS — E559 Vitamin D deficiency, unspecified: Secondary | ICD-10-CM | POA: Diagnosis not present

## 2020-02-28 DIAGNOSIS — E1142 Type 2 diabetes mellitus with diabetic polyneuropathy: Secondary | ICD-10-CM | POA: Diagnosis not present

## 2020-02-28 DIAGNOSIS — Z79891 Long term (current) use of opiate analgesic: Secondary | ICD-10-CM | POA: Diagnosis not present

## 2020-02-28 DIAGNOSIS — I10 Essential (primary) hypertension: Secondary | ICD-10-CM | POA: Diagnosis not present

## 2020-02-28 DIAGNOSIS — E118 Type 2 diabetes mellitus with unspecified complications: Secondary | ICD-10-CM | POA: Diagnosis not present

## 2020-02-28 DIAGNOSIS — E7800 Pure hypercholesterolemia, unspecified: Secondary | ICD-10-CM | POA: Diagnosis not present

## 2020-02-28 DIAGNOSIS — Z6838 Body mass index (BMI) 38.0-38.9, adult: Secondary | ICD-10-CM | POA: Diagnosis not present

## 2020-02-28 DIAGNOSIS — E1140 Type 2 diabetes mellitus with diabetic neuropathy, unspecified: Secondary | ICD-10-CM | POA: Diagnosis not present

## 2020-02-28 DIAGNOSIS — I639 Cerebral infarction, unspecified: Secondary | ICD-10-CM | POA: Diagnosis not present

## 2020-02-28 MED ORDER — TRAMADOL HCL 50 MG PO TABS: 50.0000 mg | ORAL_TABLET | Freq: Two times a day (BID) | ORAL | 0 refills | Status: DC

## 2020-02-28 NOTE — Patient Instructions (Addendum)
Please call if you fall again will need to restart PT

## 2020-02-28 NOTE — Progress Notes (Signed)
Subjective:    Patient ID: Tina [name], female    DOB: [birth year], 50 y.o.   MRN: [id number]  HPI  50 year old female with history of left pontine infarct in 2011.  She has a chronic mild Right hemiparesis and Right hemisensory deficits with dysesthesias on the right . Left >Righ Had Covid 19 ~47mo ago, was not hospitalized but has had fatigue since that time, also has had persistent taste and smell abnormalities.  Had vaccination last April.  Feels like stroke symptoms have worsened since Covid No need for assistive device but feels balance is off, falls x 2 , once on steps .  Fell on buttocks  But no significant injury   Pt has stated HEP form PT  Also has a weight bench and is doing knee extensions  The patient takes tramadol 50 mg daily as needed but does not take this on a daily basis and prescription from about a month ago still has about half a bottle left.  This would indicate approximately 1 tablet every other day Pain Inventory Average Pain 8 Pain Right Now 7 My pain is sharp, burning, stabbing and aching  In the last 24 hours, has pain interfered with the following? General activity 9 Relation with others 9 Enjoyment of life 9 What TIME of day is your pain at its worst? morning , daytime, evening and night Sleep (in general) Poor  Pain is worse with: walking, bending, standing and some activites Pain improves with: therapy/exercise, pacing activities and medication Relief from Meds: 5  Family History  Problem Relation Age of Onset  . Diabetes Father   . Kidney disease Father   . Depression Father   . Drug abuse Father   . Allergic rhinitis Mother   . Eczema Mother   . Urticaria Mother   . Depression Mother   . Anxiety disorder Mother   . Bipolar disorder Mother   . Alcoholism Mother   . Drug abuse Mother   . Eating disorder Mother   . Diabetes Maternal Grandmother   . Hyperlipidemia Paternal Grandmother   . Stroke Paternal Grandmother   . Eczema  Sister   . Urticaria Sister   . Colon cancer Paternal Uncle   . Other Neg Hx   . Angioedema Neg Hx   . Asthma Neg Hx   . Colon polyps Neg Hx   . Esophageal cancer Neg Hx   . Rectal cancer Neg Hx   . Stomach cancer Neg Hx    Social History   Socioeconomic History  . Marital status: Married    Spouse name: Annie Main  . Number of children: 3  . Years of education: BA degree  . Highest education level: Not on file  Occupational History  . Occupation: stay at home mom    Employer: UNEMPLOYED  Tobacco Use  . Smoking status: Never Smoker  . Smokeless tobacco: Never Used  Vaping Use  . Vaping Use: Never used  Substance and Sexual Activity  . Alcohol use: No    Alcohol/week: 0.0 standard drinks  . Drug use: No  . Sexual activity: Yes    Birth control/protection: None  Other Topics Concern  . Not on file  Social History Narrative   Patient is married with 2 children.   Patient is right handed.   Patient has her  BA degree.   Patient drinks 1 cup daily.   Social Determinants of Health   Financial Resource Strain: Not on file  Food Insecurity: No Food  Insecurity  . Worried About Charity fundraiser in the Last Year: Never true  . Ran Out of Food in the Last Year: Never true  Transportation Needs: No Transportation Needs  . Lack of Transportation (Medical): No  . Lack of Transportation (Non-Medical): No  Physical Activity: Not on file  Stress: Not on file  Social Connections: Not on file   Past Surgical History:  Procedure Laterality Date  . APPLICATION OF WOUND VAC N/A 05/25/2015   Procedure: APPLICATION OF WOUND VAC;  Surgeon: Michael Boston, MD;  Location: [facility];  Service: General;  Laterality: N/A;  . CESAREAN SECTION  2012  . COLONOSCOPY    . DIAGNOSTIC LAPAROSCOPY    . ESOPHAGOGASTRODUODENOSCOPY N/A 06/03/2012   Procedure: ESOPHAGOGASTRODUODENOSCOPY (EGD);  Surgeon: [name] Craver, MD;  Location: [facility] ENDOSCOPY;  Service: Endoscopy;  Laterality: N/A;  . EXCISION MASS  ABDOMINAL N/A 05/25/2015   Procedure: ABDOMINAL WALL EXPLORATION EXCISION OF SEROMA REMOVAL OF REDUNDANT SKIN ;  Surgeon: Michael Boston, MD;  Location: [facility];  Service: General;  Laterality: N/A;  . EYE SURGERY     Eye laser for vessel hemorrhaging  . fybroid removal    . HERNIA REPAIR  10/03/11   ventral hernia repair  . INSERTION OF MESH N/A 02/04/2013   Procedure: INSERTION OF MESH;  Surgeon: Adin Hector, MD;  Location: [facility];  Service: General;  Laterality: N/A;  . UMBILICAL HERNIA REPAIR N/A 02/04/2013   Procedure: LAPAROSCOPIC ventral wall hernia repair LAPAROSCOPIC LYSIS OF ADHESIONS laparoscopic exploration of abdomen ;  Surgeon: Adin Hector, MD;  Location: [facility];  Service: General;  Laterality: N/A;  . UPPER GASTROINTESTINAL ENDOSCOPY    . URETER REVISION     Bilateral "twisted"  . UTERINE FIBROID SURGERY     2 SURGERIES FOR FIBROIDS  . VENTRAL HERNIA REPAIR  10/03/2011   Procedure: LAPAROSCOPIC VENTRAL HERNIA;  Surgeon: Adin Hector, MD;  Location: [facility];  Service: General;  Laterality: N/A;   Past Surgical History:  Procedure Laterality Date  . APPLICATION OF WOUND VAC N/A 05/25/2015   Procedure: APPLICATION OF WOUND VAC;  Surgeon: Michael Boston, MD;  Location: [facility];  Service: General;  Laterality: N/A;  . CESAREAN SECTION  2012  . COLONOSCOPY    . DIAGNOSTIC LAPAROSCOPY    . ESOPHAGOGASTRODUODENOSCOPY N/A 06/03/2012   Procedure: ESOPHAGOGASTRODUODENOSCOPY (EGD);  Surgeon: [name] Craver, MD;  Location: [facility] ENDOSCOPY;  Service: Endoscopy;  Laterality: N/A;  . EXCISION MASS ABDOMINAL N/A 05/25/2015   Procedure: ABDOMINAL WALL EXPLORATION EXCISION OF SEROMA REMOVAL OF REDUNDANT SKIN ;  Surgeon: Michael Boston, MD;  Location: [facility];  Service: General;  Laterality: N/A;  . EYE SURGERY     Eye laser for vessel hemorrhaging  . fybroid removal    . HERNIA REPAIR  10/03/11   ventral hernia repair  . INSERTION OF MESH N/A 02/04/2013   Procedure: INSERTION OF MESH;  Surgeon:  Adin Hector, MD;  Location: [facility];  Service: General;  Laterality: N/A;  . UMBILICAL HERNIA REPAIR N/A 02/04/2013   Procedure: LAPAROSCOPIC ventral wall hernia repair LAPAROSCOPIC LYSIS OF ADHESIONS laparoscopic exploration of abdomen ;  Surgeon: Adin Hector, MD;  Location: [facility];  Service: General;  Laterality: N/A;  . UPPER GASTROINTESTINAL ENDOSCOPY    . URETER REVISION     Bilateral "twisted"  . UTERINE FIBROID SURGERY     2 SURGERIES FOR FIBROIDS  . VENTRAL HERNIA REPAIR  10/03/2011   Procedure: LAPAROSCOPIC VENTRAL HERNIA;  Surgeon: Adin Hector, MD;  Location: [facility];  Service: General;  Laterality: N/A;   Past Medical History:  Diagnosis Date  . Allergy   . Anemia    DURING MENSES--HAS HEAVY BLEEDING WITH PERODS  . Anxiety   . Back pain, chronic    "ongoing"  . Blood transfusion    IN 2012  AFTER C -SECTION  . Cerebral thrombosis with cerebral infarction [facility]) JUNE 2011   RIGHT SIDED WEAKNESS ( ARM AND LEG ) AND SPASMS-remains with slight weakness and vertigo.  . Constipation   . Depression   . Diabetes mellitus   . Diabetic neuropathy ([place])    BOTH FEET --COMES AND GOES  . Edema, lower extremity   . Endometriosis   . Fatty liver   . GERD (gastroesophageal reflux disease)    with pregnancy  . H/O eye surgery   . Headache(784.0)    MIGRAINES--NOT REALLY HEADACHE-MORE LIKE PRESSURE SENSATION IN HEAD-FEELS DIZZIY AND  FAINT AS THE PRESSURE RESOLVES  . Hernia, incisional, RLQ, s/p lap repair Sep 2013 09/02/2011  . History of vertigo 03/22/2018  . Hx of migraines 10/19/2011  . Hypertension   . Leg pain, right    "like bad Charley horse"  . Multiple food allergies   . Panic disorder without agoraphobia   . Rash    HANDS, ARMS --STATES HX OF RASH EVER SINCE CHILDBIRTH/PREGNANCY.  STATES THE RASH OFTEN OCCURS WHEN SHE IS REALLY STRESSED."goes and comes-presently left ring finger"  . Restless leg syndrome    DIAGNOSED BY SLEEP STUDY - PT TOLD SHE DID NOT HAVE  SLEEP APNEA  . Right rotator cuff tear    PAIN IN RIGHT SHOULDER  . SBO (small bowel obstruction) ([place]) 06/03/2012  . Shortness of breath   . Spastic hemiplegia affecting dominant side ([place])   . Stomach problems   . Stroke ([place])   . Ventral hernia    RIGHT LOWER QUADRANT-CAUSING SOME PAIN  . Weakness of right side of body    BP (!) 165/95   Pulse 82   Temp 98.6 F (37 C)   Ht 5' 5.5" (1.664 m)   Wt 243 lb (110.2 kg)   SpO2 98%   BMI 39.82 kg/m   Opioid Risk Score:   Fall Risk Score:  `1  Depression screen PHQ 2/9  Depression screen [facility] 2/9 07/27/2019 02/09/2019 11/18/2018 05/18/2018 05/13/2018 01/28/2018 01/01/2018  Decreased Interest 0 0 0 3 1 1 1   Down, Depressed, Hopeless 1 1 1 2  0 0 1  PHQ - 2 Score 1 1 1 5 1 1 2   Altered sleeping - - - 3 - - -  Tired, decreased energy - - - 3 - - -  Change in appetite - - - 3 - - -  Feeling bad or failure about yourself  - - - 3 - - -  Trouble concentrating - - - 2 - - -  Moving slowly or fidgety/restless - - - 0 - - -  Suicidal thoughts - - - 0 - - -  PHQ-9 Score - - - 19 - - -  Difficult doing work/chores - - - - - - -  Some recent data might be hidden     Review of Systems  Musculoskeletal: Positive for back pain.       Shoulder pain Arm pain  Hip pain Leg pain  All other systems reviewed and are negative.      Objective:   Physical Exam Vitals and  nursing note reviewed.  Constitutional:      Appearance: She is obese.  HENT:     Head: Normocephalic and atraumatic.  Eyes:     Extraocular Movements: Extraocular movements intact.     Conjunctiva/sclera: Conjunctivae normal.     Pupils: Pupils are equal, round, and reactive to light.  Musculoskeletal:     Comments: No pain to palpation the lumbar spine area reduced lumbar range of motion Negative straight leg raising  Skin:    General: Skin is warm and dry.  Neurological:     Mental Status: She is alert and oriented to person, place, and time. Mental status is at  baseline.     Sensory: Sensory deficit present.     Motor: Weakness present.     Coordination: Coordination abnormal.     Gait: Gait abnormal.     Comments: Motor strength is 4/5 in the right deltoid, bicep, tricep, grip, hip flexor, knee extensor, ankle dorsiflexor plantar flexor Left side is 5/5 in the left deltoid, bicep, tricep, grip, hip flexor, knee extensor, ankle dorsiflexor and plantar flexor Sensation mildly reduced on the right side to light touch in the left side.  This is both in the upper and lower limb. No evidence of facial droop No evidence of dysarthria or aphasia  Psychiatric:        Mood and Affect: Mood normal.        Behavior: Behavior normal.     Ambulates without assistive device no evidence of toe drag or knee instability she is unable to toe walk or heel walk without holding onto the wall.  She also has difficulty with tandem gait      Assessment & Plan:  #1.  History of chronic right hemiparesis due to left pontine infarct, no significant changes in neurologic status.  I believe her fall is most likely due to some mild deconditioning after Covid.  She has started a home exercise program.  We discussed sending her to physical therapy but she would like to hold off on this at this time.  I instructed her to call me if she falls again.  Patient taking modest amounts of tramadol I do not think this is contributing to any falls.  Will check toxicology today.  I will see patient back in 6 months sooner if she has some persistent decline in balance/mobility

## 2020-03-01 ENCOUNTER — Other Ambulatory Visit: Payer: Self-pay | Admitting: Sports Medicine

## 2020-03-01 DIAGNOSIS — M25522 Pain in left elbow: Secondary | ICD-10-CM | POA: Diagnosis not present

## 2020-03-03 LAB — DRUG TOX MONITOR 1 W/CONF, ORAL FLD

## 2020-03-03 LAB — DRUG TOX ALC METAB W/CON, ORAL FLD: Alcohol Metabolite: NEGATIVE ng/mL (ref <25)

## 2020-03-07 ENCOUNTER — Telehealth: Payer: Self-pay | Admitting: *Deleted

## 2020-03-07 NOTE — Telephone Encounter (Signed)
Oral swab drug screen was consistent for prescribed medications.  ?

## 2020-03-14 DIAGNOSIS — G5622 Lesion of ulnar nerve, left upper limb: Secondary | ICD-10-CM | POA: Diagnosis not present

## 2020-03-14 DIAGNOSIS — G5602 Carpal tunnel syndrome, left upper limb: Secondary | ICD-10-CM | POA: Diagnosis not present

## 2020-03-27 DIAGNOSIS — M25522 Pain in left elbow: Secondary | ICD-10-CM | POA: Diagnosis not present

## 2020-03-27 DIAGNOSIS — M79642 Pain in left hand: Secondary | ICD-10-CM | POA: Diagnosis not present

## 2020-03-31 ENCOUNTER — Ambulatory Visit
Admission: RE | Admit: 2020-03-31 | Discharge: 2020-03-31 | Disposition: A | Discharge status: Home / Self Care | Payer: Federal, State, Local not specified - PPO | Source: Ambulatory Visit | Attending: Sports Medicine | Admitting: Sports Medicine

## 2020-03-31 ENCOUNTER — Other Ambulatory Visit: Payer: Self-pay

## 2020-03-31 DIAGNOSIS — M25532 Pain in left wrist: Secondary | ICD-10-CM | POA: Diagnosis not present

## 2020-03-31 DIAGNOSIS — M25522 Pain in left elbow: Secondary | ICD-10-CM | POA: Diagnosis not present

## 2020-03-31 DIAGNOSIS — S56512A Strain of other extensor muscle, fascia and tendon at forearm level, left arm, initial encounter: Secondary | ICD-10-CM | POA: Diagnosis not present

## 2020-04-16 ENCOUNTER — Ambulatory Visit: Payer: HMO | Admitting: Cardiology

## 2020-04-19 DIAGNOSIS — G5602 Carpal tunnel syndrome, left upper limb: Secondary | ICD-10-CM | POA: Diagnosis not present

## 2020-04-19 DIAGNOSIS — M7712 Lateral epicondylitis, left elbow: Secondary | ICD-10-CM | POA: Diagnosis not present

## 2020-04-19 DIAGNOSIS — M7702 Medial epicondylitis, left elbow: Secondary | ICD-10-CM | POA: Diagnosis not present

## 2020-04-19 DIAGNOSIS — G5622 Lesion of ulnar nerve, left upper limb: Secondary | ICD-10-CM | POA: Diagnosis not present

## 2020-04-19 NOTE — Progress Notes (Signed)
Primary Physician:  [name], DO  Patient ID: Tina [name], female    DOB: 10-Nov-1970, 50 y.o.   MRN: [id number]  Date: 04/20/20 Last Office Visit: 10/17/2019  Subjective:   Chief Complaint  Patient presents with  . Diastolic dysfunction without heart failure  . Follow-up    6  month  . Hypertension     HPI: Cathye ANGLES TREVIZO  is a [name]  with history of CVA with residual right-sided hemiparesis which is essentially improved and does very minimal residual defect, insulin dependent diabetes mellitus, obesity, hypertension, obesity comes in to the office with a chief complaint of " 33-month follow-up for management of diastolic dysfunction without congestive heart failure."   Patient is accompanied by her husband at today's office visit.  Over the last 6 months patient states that she is doing well from a cardiovascular standpoint.  She denies hospitalizations or urgent care visits for cardiovascular symptoms.  Echocardiogram noted evidence of diastolic dysfunction and since then we have tried to uptitrate her guideline directed medical therapy to improve her modifiable cardiovascular risk factors such as hypertension. Patient states that her blood pressure continues to be not well controlled.  She is allergic/intolerant to multiple medications.  She was on valsartan in the past but this was discontinued due to swelling.  Spironolactone caused her to have hyperkalemia.  Hydralazine is listed as an allergy but does not remember its side effect profile.  She was on amlodipine which she discontinued for reasons unknown.  She is currently on carvedilol, Lasix, and clonidine for blood pressure management.  Currently working with PCP.  Patient states that she did have a sleep study done since last office visit and she is diagnosed with obstructive sleep apnea.  She states that the machine is on backorder and will be getting it in the next several weeks.   Past  Medical History:  Diagnosis Date  . Allergy   . Anemia    DURING MENSES--HAS HEAVY BLEEDING WITH PERODS  . Anxiety   . Back pain, chronic    "ongoing"  . Blood transfusion    IN 2012  AFTER C -SECTION  . Cerebral thrombosis with cerebral infarction [facility]) JUNE 2011   RIGHT SIDED WEAKNESS ( ARM AND LEG ) AND SPASMS-remains with slight weakness and vertigo.  . Constipation   . Depression   . Diabetes mellitus   . Diabetic neuropathy ([place])    BOTH FEET --COMES AND GOES  . Edema, lower extremity   . Endometriosis   . Fatty liver   . GERD (gastroesophageal reflux disease)    with pregnancy  . H/O eye surgery   . Headache(784.0)    MIGRAINES--NOT REALLY HEADACHE-MORE LIKE PRESSURE SENSATION IN HEAD-FEELS DIZZIY AND  FAINT AS THE PRESSURE RESOLVES  . Hernia, incisional, RLQ, s/p lap repair Sep 2013 09/02/2011  . History of vertigo 03/22/2018  . Hx of migraines 10/19/2011  . Hypertension   . Leg pain, right    "like bad Charley horse"  . Multiple food allergies   . Panic disorder without agoraphobia   . Rash    HANDS, ARMS --STATES HX OF RASH EVER SINCE CHILDBIRTH/PREGNANCY.  STATES THE RASH OFTEN OCCURS WHEN SHE IS REALLY STRESSED."goes and comes-presently left ring finger"  . Restless leg syndrome    DIAGNOSED BY SLEEP STUDY - PT TOLD SHE DID NOT HAVE SLEEP APNEA  . Right rotator cuff tear    PAIN IN RIGHT SHOULDER  . SBO (small  bowel obstruction) ([place]) 06/03/2012  . Shortness of breath   . Spastic hemiplegia affecting dominant side ([place])   . Stomach problems   . Stroke ([place])   . Ventral hernia    RIGHT LOWER QUADRANT-CAUSING SOME PAIN  . Weakness of right side of body     Past Surgical History:  Procedure Laterality Date  . APPLICATION OF WOUND VAC N/A 05/25/2015   Procedure: APPLICATION OF WOUND VAC;  Surgeon: Michael Boston, MD;  Location: [facility];  Service: General;  Laterality: N/A;  . CESAREAN SECTION  2012  . COLONOSCOPY    . DIAGNOSTIC LAPAROSCOPY    .  ESOPHAGOGASTRODUODENOSCOPY N/A 06/03/2012   Procedure: ESOPHAGOGASTRODUODENOSCOPY (EGD);  Surgeon: [name] Craver, MD;  Location[facility] ENDOSCOPY;  Service: Endoscopy;  Laterality: N/A;  . EXCISION MASS ABDOMINAL N/A 05/25/2015   Procedure: ABDOMINAL WALL EXPLORATION EXCISION OF SEROMA REMOVAL OF REDUNDANT SKIN ;  Surgeon: Michael Boston, MD;  Location: [facility];  Service: General;  Laterality: N/A;  . EYE SURGERY     Eye laser for vessel hemorrhaging  . fybroid removal    . HERNIA REPAIR  10/03/11   ventral hernia repair  . INSERTION OF MESH N/A 02/04/2013   Procedure: INSERTION OF MESH;  Surgeon: Adin Hector, MD;  Location: [facility];  Service: General;  Laterality: N/A;  . UMBILICAL HERNIA REPAIR N/A 02/04/2013   Procedure: LAPAROSCOPIC ventral wall hernia repair LAPAROSCOPIC LYSIS OF ADHESIONS laparoscopic exploration of abdomen ;  Surgeon: Adin Hector, MD;  Location: [facility];  Service: General;  Laterality: N/A;  . UPPER GASTROINTESTINAL ENDOSCOPY    . URETER REVISION     Bilateral "twisted"  . UTERINE FIBROID SURGERY     2 SURGERIES FOR FIBROIDS  . VENTRAL HERNIA REPAIR  10/03/2011   Procedure: LAPAROSCOPIC VENTRAL HERNIA;  Surgeon: Adin Hector, MD;  Location: [facility];  Service: General;  Laterality: N/A;    Social History   Tobacco Use  . Smoking status: Never Smoker  . Smokeless tobacco: Never Used  Vaping Use  . Vaping Use: Never used  Substance Use Topics  . Alcohol use: No    Alcohol/week: 0.0 standard drinks  . Drug use: No     Review of Systems  Constitutional: Positive for weight gain. Negative for decreased appetite and malaise/fatigue.  Eyes: Negative for visual disturbance.  Cardiovascular: Negative for chest pain, claudication, dyspnea on exertion, leg swelling, orthopnea, palpitations and syncope.  Respiratory: Positive for sleep disturbances due to breathing. Negative for hemoptysis and wheezing.   Endocrine: Negative for cold intolerance and heat intolerance.   Hematologic/Lymphatic: Does not bruise/bleed easily.  Skin: Negative for nail changes.  Musculoskeletal: Negative for muscle weakness and myalgias.  Gastrointestinal: Negative for abdominal pain, change in bowel habit, nausea and vomiting.  Neurological: Negative for difficulty with concentration, dizziness, focal weakness and headaches.  Psychiatric/Behavioral: Negative for altered mental status and suicidal ideas.  All other systems reviewed and are negative.     Objective:  Blood pressure (!) 165/88, pulse 71, temperature 98.3 F (36.8 C), resp. rate 17, height 5\' 5"  (1.651 m), weight 243 lb (110.2 kg), SpO2 97 %. Body mass index is 40.44 kg/m.    Physical Exam Vitals reviewed.  Constitutional:      Appearance: She is well-developed.  HENT:     Head: Normocephalic and atraumatic.  Cardiovascular:     Rate and Rhythm: Normal rate and regular rhythm.     Pulses: Intact distal pulses.  Femoral pulses are 2+ on the right side and 2+ on the left side.      Popliteal pulses are 2+ on the right side and 2+ on the left side.       Dorsalis pedis pulses are 2+ on the right side and 2+ on the left side.       Posterior tibial pulses are 2+ on the right side and 2+ on the left side.     Heart sounds: S1 normal and S2 normal. No murmur heard. No gallop.   Pulmonary:     Effort: Pulmonary effort is normal. No accessory muscle usage or respiratory distress.     Breath sounds: Normal breath sounds. No wheezing or rales.  Abdominal:     General: Bowel sounds are normal.     Palpations: Abdomen is soft.  Musculoskeletal:        General: Normal range of motion.     Cervical back: Normal range of motion.  Skin:    General: Skin is warm and dry.  Neurological:     Mental Status: She is alert and oriented to person, place, and time.    Radiology: No results found.  Laboratory examination:    CMP Latest Ref Rng & Units 08/02/2019 04/12/2019 02/16/2019  Glucose 65 - 99 mg/dL -  323(H) -  BUN 4 - 21 15 13 13   Creatinine 0.5 - 1.1 1.1 1.09(H) 1.0  Sodium 137 - 147 139 136 139  Potassium 3.4 - 5.3 4.7 5.0 4.9  Chloride 99 - 108 101 98 104  CO2 13 - 22 26(A) 27 27(A)  Calcium 8.7 - 10.7 8.7 9.1 8.8  Total Protein 6.0 - 8.5 g/dL - 6.4 -  Total Bilirubin 0.0 - 1.2 mg/dL - <0.2 -  Alkaline Phos 25 - 125 79 93 71  AST 13 - 35 14 17 14   ALT 7 - 35 11 20 23    CBC Latest Ref Rng & Units 02/16/2019 12/08/2018 04/20/2018  WBC - 7.4 6.3 13.1(H)  Hemoglobin 12.0 - 16.0 11.7(A) 11.3(A) 13.5  Hematocrit 36 - 46 37 35(A) 42.4  Platelets 150 - 399 399 - 348   Lipid Panel     Component Value Date/Time   CHOL 191 08/02/2019 0000   CHOL 169 02/15/2018 1034   TRIG 207 (A) 08/02/2019 0000   HDL 54 08/02/2019 0000   HDL 41 02/15/2018 1034   CHOLHDL 6.0 10/17/2011 0315   VLDL 59 (H) 10/17/2011 0315   LDLCALC 102 08/02/2019 0000   LDLCALC 94 02/15/2018 1034   HEMOGLOBIN A1C Lab Results  Component Value Date   HGBA1C 8.4 08/02/2019   MPG 169 05/18/2015   TSH No results for input(s): TSH in the last 8760 hours.  Current Meds  Medication Sig  . allopurinol (ZYLOPRIM) 100 MG tablet Take 100 mg by mouth every evening.  Marland Kitchen amLODipine (NORVASC) 10 MG tablet Take 10 mg by mouth daily.  Marland Kitchen aspirin EC 81 MG tablet Take 81 mg by mouth daily. Swallow whole.  Marland Kitchen atorvastatin (LIPITOR) 10 MG tablet Take 1 tablet (10 mg total) by mouth daily.  . carvedilol (COREG) 25 MG tablet Take 25 mg by mouth 2 (two) times daily.  . cloNIDine (CATAPRES) 0.2 MG tablet Take 0.2 mg by mouth 2 (two) times daily.  . Continuous Blood Gluc Receiver (FREESTYLE LIBRE 2 READER) DEVI daily. as directed  . Continuous Blood Gluc Sensor (FREESTYLE LIBRE 2 SENSOR) MISC USE 1 SENSOR EVERY 14 DAYS AS DIRECTED  .  DULoxetine (CYMBALTA) 20 MG capsule Take 20 mg by mouth daily. 20 mg am, 40 mg pm  . EPINEPHrine 0.3 mg/0.3 mL IJ SOAJ injection INJECT AS DIRECTED AS NEEDED FOR ALLERGIC REACTION  . fluconazole (DIFLUCAN)  100 MG tablet Take 1 tablet (100 mg total) by mouth daily.  . furosemide (LASIX) 40 MG tablet Take 40 mg by mouth.  . gabapentin (NEURONTIN) 300 MG capsule TAKE 2 CAPSULES BY MOUTH THREE TIMES DAILY  . insulin aspart (NOVOLOG) 100 UNIT/ML injection Inject 5-15 Units into the skin 3 (three) times daily with meals. Per sliding scale--pt uses Omnipod and gets a basal rate  . Insulin Glargine (BASAGLAR KWIKPEN) 100 UNIT/ML SMARTSIG:18 Unit(s) SUB-Q Every Night  . omeprazole (PRILOSEC) 40 MG capsule Take 1 capsule by mouth once daily  . Semaglutide (OZEMPIC, 0.25 OR 0.5 MG/DOSE, [place]) Inject 0.5 mg into the skin.  Marland Kitchen tiZANidine (ZANAFLEX) 2 MG tablet TAKE 1 TABLET BY MOUTH ONCE DAILY AT NIGHT AT BEDTIME  . traMADol (ULTRAM) 50 MG tablet Take 1 tablet (50 mg total) by mouth 2 (two) times daily.  . Vitamin D, Ergocalciferol, (DRISDOL) 1.25 MG (50000 UNIT) CAPS capsule Take 1 capsule (50,000 Units total) by mouth every 7 (seven) days.    Cardiac Studies:   EKG: 04/20/2020: Normal sinus rhythm, 67 bpm, left axis deviation, left anterior fascicular block, LVH per voltage criteria, without underlying injury pattern.   Echocardiogram 08/21/2017: Left ventricle cavity is normal in size. Mild concentric hypertrophy of the left ventricle. Normal global wall motion. Doppler evidence of grade II (pseudonormal) diastolic dysfunction, elevated LAP. Calculated EF 55%. Mildly restricted aortic valve leaflets with trace aortic valve stenosis. Aortic valve mean gradient of 8 mmHg, Vmax of 2.0 m/s. Calculated aortic valve area by continuity equation is 1.4 cm. No evidence of pulmonary hypertension.  Lexiscan myoview stress test 08/21/2017:  1. Lexiscan stress test was performed. Exercise capacity was not assessed. Resting BP 148/90 mmHg, peak effect BP 168/90 mmHg. Stress symptoms included dizziness, nausea, headache, chest tightness.  2. The overall quality of the study is good. There is no evidence of abnormal lung  activity. Stress and rest SPECT images demonstrate homogeneous tracer distribution throughout the myocardium. Gated SPECT imaging reveals normal myocardial thickening and wall motion. The left ventricular ejection fraction was normal calculated as 45%, although visually appears normal.  3. Low risk study.  ABI 08/21/2017: This exam reveals normal perfusion of both the lower extremity (RABI 1.27 and LABI 1.20 with biphasic waveform). ABI may be falsely elevated in patients with DM and medial calcinosis.  Assessment:     [id number]   1. Diastolic dysfunction without heart failure  I51.89 EKG 12-Lead  2. Essential hypertension  I10   3. History of CVA with residual deficit  I69.30   4. Type 2 diabetes mellitus with diabetic neuropathy, with long-term current use of insulin (HCC)  E11.40    Z79.4   5. Long-term insulin use (HCC)  Z79.4   6. Class 3 severe obesity due to excess calories with serious comorbidity and body mass index (BMI) of 40.0 to 44.9 in adult [facility])  E66.01    Z68.41    Recommendations:   Diastolic dysfunction:  Overall patient is euvolemic.  Medications reconciled.  Her care is being challenging due to multiple medication allergies/intolerances.  Valsartan: Swelling.  Spironolactone: Hyperkalemia.  Hydralazine: Patient does not recall.  Patient is currently working with her PCP for blood pressure management and currently on Lasix, carvedilol, and clonidine.  Encourage her  to use a CPAP machine when she has it as it will help her cardiovascular outcomes as well as blood pressure  Given her underlying diastolic dysfunction recommended and stressed the importance of improving her modifiable cardiovascular risk factors such as diabetes, blood pressure management, obesity.  Insulin-dependent diabetes mellitus type 2: Currently managed by her PCP.  Hyperlipidemia:  Currently on statin therapy.  Currently managed by primary care provider.  I have asked her to  bring in a copy of her most recent blood work at the next office visit to review her lipid profile  Benign essential hypertension:  Office blood pressures well controlled.  Up titration has been challenging due to multiple allergies/intolerances.  Currently working with PCP for up titration of her antihypertensive regimen.  I have strongly encouraged her to restart amlodipine as that she discontinued for reasons unknown.  And even consider hydralazine if she does not recall her side effect/intolerance.  Obesity, due to excess calories: Body mass index is 40.44 kg/m.  Patient continues to be seen at the weight loss clinic which was reemphasized and encouraged.  I reviewed with the patient the importance of diet, regular physical activity/exercise, weight loss.    Patient is educated on increasing physical activity gradually as tolerated.  With the goal of moderate intensity exercise for 30 minutes a day 5 days a week.   Orders Placed This Encounter  Procedures  . EKG 12-Lead   --Continue cardiac medications as reconciled in final medication list. --Return in about 1 year (around 04/20/2021) for Follow up diastolic dysfunction . Or sooner if needed. --Continue follow-up with your primary care physician regarding the management of your other chronic comorbid conditions.  Patient's questions and concerns were addressed to her satisfaction. She voices understanding of the instructions provided during this encounter.   This note was created using a voice recognition software as a result there may be grammatical errors inadvertently enclosed that do not reflect the nature of this encounter. Every attempt is made to correct such errors.  Total time spent: 23 minutes.   Rex Kras, DO, [facility]. [place] Office: [phone]

## 2020-04-20 ENCOUNTER — Other Ambulatory Visit: Payer: Self-pay

## 2020-04-20 ENCOUNTER — Encounter: Payer: Self-pay | Admitting: Cardiology

## 2020-04-20 ENCOUNTER — Ambulatory Visit: Payer: Federal, State, Local not specified - PPO | Admitting: Cardiology

## 2020-04-20 VITALS — BP 165/88 | HR 71 | Temp 98.3°F | Resp 17 | Ht 65.0 in | Wt 243.0 lb

## 2020-04-20 DIAGNOSIS — Z794 Long term (current) use of insulin: Secondary | ICD-10-CM | POA: Diagnosis not present

## 2020-04-20 DIAGNOSIS — I6930 Unspecified sequelae of cerebral infarction: Secondary | ICD-10-CM

## 2020-04-20 DIAGNOSIS — I5189 Other ill-defined heart diseases: Secondary | ICD-10-CM | POA: Diagnosis not present

## 2020-04-20 DIAGNOSIS — Z6841 Body Mass Index (BMI) 40.0 and over, adult: Secondary | ICD-10-CM | POA: Diagnosis not present

## 2020-04-20 DIAGNOSIS — E1140 Type 2 diabetes mellitus with diabetic neuropathy, unspecified: Secondary | ICD-10-CM

## 2020-04-20 DIAGNOSIS — I10 Essential (primary) hypertension: Secondary | ICD-10-CM | POA: Diagnosis not present

## 2020-04-25 DIAGNOSIS — I10 Essential (primary) hypertension: Secondary | ICD-10-CM | POA: Diagnosis not present

## 2020-04-30 ENCOUNTER — Other Ambulatory Visit: Payer: Self-pay | Admitting: Physical Medicine & Rehabilitation

## 2020-05-14 ENCOUNTER — Telehealth: Payer: Self-pay | Admitting: *Deleted

## 2020-05-14 DIAGNOSIS — G5622 Lesion of ulnar nerve, left upper limb: Secondary | ICD-10-CM | POA: Diagnosis not present

## 2020-05-14 DIAGNOSIS — G5602 Carpal tunnel syndrome, left upper limb: Secondary | ICD-10-CM | POA: Diagnosis not present

## 2020-05-14 NOTE — Telephone Encounter (Signed)
Patients husband left a message stating that she had nerve surgery and the surgeon prescribed oxycodone acetaminophen 5-325 mg. They are basically asking for permission to fill from Dr. Letta Pate.  Please advise

## 2020-05-15 ENCOUNTER — Other Ambulatory Visit: Payer: Self-pay

## 2020-05-15 ENCOUNTER — Emergency Department (HOSPITAL_COMMUNITY)
Admission: EM | Admit: 2020-05-15 | Discharge: 2020-05-16 | Disposition: A | Discharge status: Home / Self Care | Payer: Federal, State, Local not specified - PPO | Attending: Emergency Medicine | Admitting: Emergency Medicine

## 2020-05-15 ENCOUNTER — Emergency Department (HOSPITAL_COMMUNITY): Payer: Federal, State, Local not specified - PPO

## 2020-05-15 DIAGNOSIS — M25511 Pain in right shoulder: Secondary | ICD-10-CM | POA: Insufficient documentation

## 2020-05-15 DIAGNOSIS — Z041 Encounter for examination and observation following transport accident: Secondary | ICD-10-CM | POA: Diagnosis not present

## 2020-05-15 DIAGNOSIS — Z79899 Other long term (current) drug therapy: Secondary | ICD-10-CM | POA: Diagnosis not present

## 2020-05-15 DIAGNOSIS — I110 Hypertensive heart disease with heart failure: Secondary | ICD-10-CM | POA: Diagnosis not present

## 2020-05-15 DIAGNOSIS — R0789 Other chest pain: Secondary | ICD-10-CM | POA: Diagnosis not present

## 2020-05-15 DIAGNOSIS — E1140 Type 2 diabetes mellitus with diabetic neuropathy, unspecified: Secondary | ICD-10-CM | POA: Insufficient documentation

## 2020-05-15 DIAGNOSIS — Z91040 Latex allergy status: Secondary | ICD-10-CM | POA: Diagnosis not present

## 2020-05-15 DIAGNOSIS — I5032 Chronic diastolic (congestive) heart failure: Secondary | ICD-10-CM | POA: Insufficient documentation

## 2020-05-15 DIAGNOSIS — M791 Myalgia, unspecified site: Secondary | ICD-10-CM | POA: Diagnosis not present

## 2020-05-15 DIAGNOSIS — M7918 Myalgia, other site: Secondary | ICD-10-CM | POA: Diagnosis not present

## 2020-05-15 DIAGNOSIS — Z794 Long term (current) use of insulin: Secondary | ICD-10-CM | POA: Diagnosis not present

## 2020-05-15 DIAGNOSIS — Z7982 Long term (current) use of aspirin: Secondary | ICD-10-CM | POA: Insufficient documentation

## 2020-05-15 DIAGNOSIS — Y92481 Parking lot as the place of occurrence of the external cause: Secondary | ICD-10-CM | POA: Insufficient documentation
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
## 2020-05-15 NOTE — Telephone Encounter (Signed)
Contacted patient and advised
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
## 2020-05-16 NOTE — Discharge Instructions (Signed)
Take your pain meds and muscle relaxers   Please follow up with your primary care provider within 5-7 days for re-evaluation of your symptoms. If you do not have a primary care provider, information for a healthcare clinic has been provided for you to make arrangements for follow up care. Please return to the emergency department for any new or worsening symptoms.

## 2020-05-16 NOTE — ED Notes (Signed)
Patient verbalizes understanding of discharge instructions. Opportunity for questioning and answers were provided. Armband removed by staff, pt discharged from ED ambulatory.   

## 2020-05-29 DIAGNOSIS — Z6838 Body mass index (BMI) 38.0-38.9, adult: Secondary | ICD-10-CM | POA: Diagnosis not present

## 2020-05-29 DIAGNOSIS — E559 Vitamin D deficiency, unspecified: Secondary | ICD-10-CM | POA: Diagnosis not present

## 2020-05-29 DIAGNOSIS — E118 Type 2 diabetes mellitus with unspecified complications: Secondary | ICD-10-CM | POA: Diagnosis not present

## 2020-05-29 DIAGNOSIS — E1140 Type 2 diabetes mellitus with diabetic neuropathy, unspecified: Secondary | ICD-10-CM | POA: Diagnosis not present

## 2020-05-29 DIAGNOSIS — I639 Cerebral infarction, unspecified: Secondary | ICD-10-CM | POA: Diagnosis not present

## 2020-05-29 DIAGNOSIS — Z794 Long term (current) use of insulin: Secondary | ICD-10-CM | POA: Diagnosis not present

## 2020-05-29 DIAGNOSIS — E7800 Pure hypercholesterolemia, unspecified: Secondary | ICD-10-CM | POA: Diagnosis not present

## 2020-05-29 DIAGNOSIS — I10 Essential (primary) hypertension: Secondary | ICD-10-CM | POA: Diagnosis not present

## 2020-05-29 DIAGNOSIS — E1169 Type 2 diabetes mellitus with other specified complication: Secondary | ICD-10-CM | POA: Diagnosis not present

## 2020-06-01 DIAGNOSIS — M25522 Pain in left elbow: Secondary | ICD-10-CM | POA: Diagnosis not present

## 2020-06-07 DIAGNOSIS — I10 Essential (primary) hypertension: Secondary | ICD-10-CM | POA: Diagnosis not present

## 2020-06-15 DIAGNOSIS — M25522 Pain in left elbow: Secondary | ICD-10-CM | POA: Diagnosis not present

## 2020-06-18 ENCOUNTER — Other Ambulatory Visit: Payer: Self-pay | Admitting: Physical Medicine & Rehabilitation

## 2020-06-20 DIAGNOSIS — R7989 Other specified abnormal findings of blood chemistry: Secondary | ICD-10-CM | POA: Diagnosis not present

## 2020-06-20 DIAGNOSIS — I10 Essential (primary) hypertension: Secondary | ICD-10-CM | POA: Diagnosis not present

## 2020-06-20 DIAGNOSIS — K5900 Constipation, unspecified: Secondary | ICD-10-CM | POA: Diagnosis not present

## 2020-06-20 DIAGNOSIS — R109 Unspecified abdominal pain: Secondary | ICD-10-CM | POA: Diagnosis not present

## 2020-06-20 DIAGNOSIS — E7800 Pure hypercholesterolemia, unspecified: Secondary | ICD-10-CM | POA: Diagnosis not present

## 2020-06-21 DIAGNOSIS — M25629 Stiffness of unspecified elbow, not elsewhere classified: Secondary | ICD-10-CM | POA: Diagnosis not present

## 2020-06-26 ENCOUNTER — Other Ambulatory Visit: Payer: Self-pay

## 2020-06-26 ENCOUNTER — Encounter (HOSPITAL_BASED_OUTPATIENT_CLINIC_OR_DEPARTMENT_OTHER): Payer: Self-pay | Admitting: Obstetrics and Gynecology

## 2020-06-26 ENCOUNTER — Emergency Department (HOSPITAL_BASED_OUTPATIENT_CLINIC_OR_DEPARTMENT_OTHER)
Admission: EM | Admit: 2020-06-26 | Discharge: 2020-06-26 | Disposition: A | Discharge status: Home / Self Care | Payer: Federal, State, Local not specified - PPO | Attending: Emergency Medicine | Admitting: Emergency Medicine

## 2020-06-26 DIAGNOSIS — I5032 Chronic diastolic (congestive) heart failure: Secondary | ICD-10-CM | POA: Diagnosis not present

## 2020-06-26 DIAGNOSIS — Z7982 Long term (current) use of aspirin: Secondary | ICD-10-CM | POA: Insufficient documentation

## 2020-06-26 DIAGNOSIS — I110 Hypertensive heart disease with heart failure: Secondary | ICD-10-CM | POA: Insufficient documentation

## 2020-06-26 DIAGNOSIS — E1140 Type 2 diabetes mellitus with diabetic neuropathy, unspecified: Secondary | ICD-10-CM | POA: Insufficient documentation

## 2020-06-26 DIAGNOSIS — T7840XA Allergy, unspecified, initial encounter: Secondary | ICD-10-CM | POA: Insufficient documentation

## 2020-06-26 DIAGNOSIS — Z79899 Other long term (current) drug therapy: Secondary | ICD-10-CM | POA: Insufficient documentation

## 2020-06-26 DIAGNOSIS — Z794 Long term (current) use of insulin: Secondary | ICD-10-CM | POA: Diagnosis not present

## 2020-06-26 DIAGNOSIS — R202 Paresthesia of skin: Secondary | ICD-10-CM | POA: Insufficient documentation

## 2020-06-26 DIAGNOSIS — Z91040 Latex allergy status: Secondary | ICD-10-CM | POA: Diagnosis not present

## 2020-06-26 DIAGNOSIS — R609 Edema, unspecified: Secondary | ICD-10-CM | POA: Insufficient documentation

## 2020-06-26 MED ORDER — METHYLPREDNISOLONE SODIUM SUCC 125 MG IJ SOLR
125.0000 mg | Freq: Once | INTRAMUSCULAR | Status: AC
  Administered 2020-06-26: 125 mg via INTRAVENOUS
  Filled 2020-06-26: qty 2

## 2020-06-26 MED ORDER — PREDNISONE 10 MG PO TABS: 20.0000 mg | ORAL_TABLET | Freq: Two times a day (BID) | ORAL | 0 refills | Status: DC

## 2020-06-26 MED ORDER — DIPHENHYDRAMINE HCL 50 MG/ML IJ SOLN
50.0000 mg | Freq: Once | INTRAMUSCULAR | Status: AC
  Administered 2020-06-26: 50 mg via INTRAVENOUS
  Filled 2020-06-26: qty 1

## 2020-06-26 NOTE — ED Triage Notes (Signed)
Patient reports facial itching and swelling. Patient reports she is allergic to "a lot of things" and is unsure what caused this reaction. Patient is talking and breathing without difficulty.

## 2020-06-26 NOTE — ED Notes (Signed)
Pt provided discharge instructions and prescription information. Pt was given the opportunity to ask questions and questions were answered. Discharge signature not obtained in the setting of the COVID-19 pandemic in order to reduce high touch surfaces.  ° °

## 2020-06-26 NOTE — Discharge Instructions (Addendum)
These take medications as prescribed and Discussed. If you have any increased swelling of your mouth or tongue, please return.

## 2020-06-26 NOTE — ED Provider Notes (Signed)
[facility] EMERGENCY DEPT Provider Note   CSN: [id number] Arrival date & time: 06/26/20  1255     History Chief Complaint  Patient presents with   Allergic Reaction    Tina [name] is a 50 y.o. female.  HPI 50 year old female presents today with reports of allergic reaction.  She reports she had an unprovoked onset of tingling and swelling of the right lower lip area.  This spread to the left side of her face.  She feels like her tongue is tingling.  She feels some tingling with inspiration.  She is not dyspneic, coughing, denies nausea, or vomiting.  She has had an allergic reaction in the past.  She states she did have reaction to a blood pressure medicine but is currently not on an ACE inhibitor.  She reports taking her medications as prescribed but does not associate these symptoms today with the intake of any medications, food, or other allergen.  She denies lightheadedness or syncope.    Past Medical History:  Diagnosis Date   Allergy    Anemia    DURING MENSES--HAS HEAVY BLEEDING WITH PERODS   Anxiety    Back pain, chronic    "ongoing"   Blood transfusion    IN 2012  AFTER C -SECTION   Cerebral thrombosis with cerebral infarction [facility]) JUNE 2011   RIGHT SIDED WEAKNESS ( ARM AND LEG ) AND SPASMS-remains with slight weakness and vertigo.   Constipation    Depression    Diabetes mellitus    Diabetic neuropathy (HCC)    BOTH FEET --COMES AND GOES   Edema, lower extremity    Endometriosis    Fatty liver    GERD (gastroesophageal reflux disease)    with pregnancy   H/O eye surgery    Headache(784.0)    MIGRAINES--NOT REALLY HEADACHE-MORE LIKE PRESSURE SENSATION IN HEAD-FEELS DIZZIY AND  FAINT AS THE PRESSURE RESOLVES   Hernia, incisional, RLQ, s/p lap repair Sep 2013 09/02/2011   History of vertigo 03/22/2018   Hx of migraines 10/19/2011   Hypertension    Leg pain, right    "like bad Charley horse"   Multiple food allergies    Panic disorder  without agoraphobia    Rash    HANDS, ARMS --STATES HX OF RASH EVER SINCE CHILDBIRTH/PREGNANCY.  STATES THE RASH OFTEN OCCURS WHEN SHE IS REALLY STRESSED."goes and comes-presently left ring finger"   Restless leg syndrome    DIAGNOSED BY SLEEP STUDY - PT TOLD SHE DID NOT HAVE SLEEP APNEA   Right rotator cuff tear    PAIN IN RIGHT SHOULDER   SBO (small bowel obstruction) ([place]) 06/03/2012   Shortness of breath    Spastic hemiplegia affecting dominant side (HCC)    Stomach problems    Stroke (HCC)    Ventral hernia    RIGHT LOWER QUADRANT-CAUSING SOME PAIN   Weakness of right side of body     Patient Active Problem List   Diagnosis Date Noted   Type 2 macular telangiectasis, bilateral 01/03/2020   Excessive daytime sleepiness 09/12/2019   Sleep paralysis 09/12/2019   Hypnopompic hallucination 09/12/2019   Abnormal dreams 09/12/2019   Diabetic macular edema of right eye with proliferative retinopathy associated with type 2 diabetes mellitus ([place]) 07/04/2019   Stable treated proliferative diabetic retinopathy of left eye determined by examination associated with type 2 diabetes mellitus ([place]) 07/04/2019   Long-term insulin use ([place]) 04/07/2019   Depression 04/13/2018   History of vertigo 03/22/2018   Laboratory examination  03/22/2018   Chronic diastolic (congestive) heart failure ([place]) 03/22/2018   Other hyperlipidemia 02/15/2018   Vitamin D deficiency 12/31/2017   Class 2 severe obesity with serious comorbidity and body mass index (BMI) of 39.0 to 39.9 in adult ([place]) 11/18/2017   Gait disturbance, post-stroke 04/23/2017   Strain of gluteus medius 04/23/2017   OSA (obstructive sleep apnea) 10/17/2015   Periodic limb movement disorder (PLMD) 10/17/2015   Essential hypertension 05/26/2015   Diabetes mellitus ([place]) 05/26/2015   Abdominal wall mass of right lower quadrant 05/25/2015   Hemiparesis affecting right side as late effect of stroke ([place]) 11/09/2014   Abdominal wall seroma     Sepsis due to undetermined organism ([place]) 03/06/2014   Nausea vomiting and diarrhea 03/06/2014   Sepsis ([place]) 03/06/2014   Tension headache 09/01/2013   Nausea with vomiting ?Gastroparesis? 03/21/2013   Spastic hemiplegia affecting dominant side ([place]) 02/16/2013   Recurrent ventral incisional hernia s/p closure/repair w mesh 02/04/2013 01/26/2013   Constipation, chronic 01/26/2013   Abdominal pain, chronic, right lower quadrant 06/03/2012   History of CVA (cerebrovascular accident) 06/03/2012   HTN (hypertension) 06/03/2012   Diabetes mellitus type II, uncontrolled ([place]) 10/16/2011   Obesity (BMI 30-39.9) 09/02/2011   Rotator cuff tear 08/25/2011   Sciatica 06/10/2011   Paresthesias in right hand 06/10/2011   Lumbago 05/09/2011   Paresthesias 05/09/2011    Past Surgical History:  Procedure Laterality Date   APPLICATION OF WOUND VAC N/A 05/25/2015   Procedure: APPLICATION OF WOUND VAC;  Surgeon: Michael Boston, MD;  Location: [facility];  Service: General;  Laterality: N/A;   CESAREAN SECTION  2012   COLONOSCOPY     DIAGNOSTIC LAPAROSCOPY     ESOPHAGOGASTRODUODENOSCOPY N/A 06/03/2012   Procedure: ESOPHAGOGASTRODUODENOSCOPY (EGD);  Surgeon: [name] Craver, MD;  Location: [facility] ENDOSCOPY;  Service: Endoscopy;  Laterality: N/A;   EXCISION MASS ABDOMINAL N/A 05/25/2015   Procedure: ABDOMINAL WALL EXPLORATION EXCISION OF SEROMA REMOVAL OF REDUNDANT SKIN ;  Surgeon: Michael Boston, MD;  Location: [facility];  Service: General;  Laterality: N/A;   EYE SURGERY     Eye laser for vessel hemorrhaging   fybroid removal     HERNIA REPAIR  10/03/11   ventral hernia repair   INSERTION OF MESH N/A 02/04/2013   Procedure: INSERTION OF MESH;  Surgeon: Adin Hector, MD;  Location: [facility];  Service: General;  Laterality: N/A;   UMBILICAL HERNIA REPAIR N/A 02/04/2013   Procedure: LAPAROSCOPIC ventral wall hernia repair LAPAROSCOPIC LYSIS OF ADHESIONS laparoscopic exploration of abdomen ;  Surgeon: Adin Hector, MD;   Location: [facility];  Service: General;  Laterality: N/A;   UPPER GASTROINTESTINAL ENDOSCOPY     URETER REVISION     Bilateral "twisted"   UTERINE FIBROID SURGERY     2 SURGERIES FOR FIBROIDS   VENTRAL HERNIA REPAIR  10/03/2011   Procedure: LAPAROSCOPIC VENTRAL HERNIA;  Surgeon: Adin Hector, MD;  Location: [facility];  Service: General;  Laterality: N/A;     OB History     Gravida  2   Para  2   Term  0   Preterm  1   AB  0   Living  1      SAB  0   IAB  0   Ectopic  0   Multiple  0   Live Births              Family History  Problem Relation Age of Onset   Diabetes Father  Kidney disease Father    Depression Father    Drug abuse Father    Allergic rhinitis Mother    Eczema Mother    Urticaria Mother    Depression Mother    Anxiety disorder Mother    Bipolar disorder Mother    Alcoholism Mother    Drug abuse Mother    Eating disorder Mother    Diabetes Maternal Grandmother    Hyperlipidemia Paternal Grandmother    Stroke Paternal Grandmother    Eczema Sister    Urticaria Sister    Colon cancer Paternal Uncle    Other Neg Hx    Angioedema Neg Hx    Asthma Neg Hx    Colon polyps Neg Hx    Esophageal cancer Neg Hx    Rectal cancer Neg Hx    Stomach cancer Neg Hx     Social History   Tobacco Use   Smoking status: Never   Smokeless tobacco: Never  Vaping Use   Vaping Use: Never used  Substance Use Topics   Alcohol use: No    Alcohol/week: 0.0 standard drinks   Drug use: No    Home Medications Prior to Admission medications   Medication Sig Start Date End Date Taking? Authorizing Provider  allopurinol (ZYLOPRIM) 100 MG tablet Take 100 mg by mouth every evening. 03/13/14   [provider]  amLODipine (NORVASC) 10 MG tablet Take 10 mg by mouth daily. 09/04/19   [provider]  aspirin EC 81 MG tablet Take 81 mg by mouth daily. Swallow whole.    [provider]  atorvastatin (LIPITOR) 10 MG tablet Take 1 tablet  (10 mg total) by mouth daily. 06/23/18   Whitmire, Joneen Boers, FNP  carvedilol (COREG) 25 MG tablet Take 25 mg by mouth 2 (two) times daily. 04/02/20   [provider]  cloNIDine (CATAPRES) 0.2 MG tablet Take 0.2 mg by mouth 2 (two) times daily. 08/13/19   [provider]  Continuous Blood Gluc Receiver (FREESTYLE LIBRE 2 READER) DEVI daily. as directed 04/13/19   [provider]  Continuous Blood Gluc Sensor (FREESTYLE LIBRE 2 SENSOR) MISC USE 1 SENSOR EVERY 14 DAYS AS DIRECTED 08/23/19   [provider]  DULoxetine (CYMBALTA) 20 MG capsule Take 20 mg by mouth daily. 20 mg am, 40 mg pm    [provider]  EPINEPHrine 0.3 mg/0.3 mL IJ SOAJ injection INJECT AS DIRECTED AS NEEDED FOR ALLERGIC REACTION 09/08/17   Kozlow, Donnamarie Poag, MD  fluconazole (DIFLUCAN) 100 MG tablet Take 1 tablet (100 mg total) by mouth daily. 12/01/19   Landis Martins, DPM  furosemide (LASIX) 40 MG tablet Take 40 mg by mouth.    [provider]  gabapentin (NEURONTIN) 300 MG capsule TAKE 2 CAPSULES BY MOUTH THREE TIMES DAILY 08/15/19   Kirsteins, Luanna Salk, MD  insulin aspart (NOVOLOG) 100 UNIT/ML injection Inject 5-15 Units into the skin 3 (three) times daily with meals. Per sliding scale--pt uses Omnipod and gets a basal rate    [provider]  Insulin Glargine (BASAGLAR KWIKPEN) 100 UNIT/ML SMARTSIG:18 Unit(s) SUB-Q Every Night 04/09/19   [provider]  omeprazole (PRILOSEC) 40 MG capsule Take 1 capsule by mouth once daily 02/09/19   Armbruster, Carlota Raspberry, MD  Semaglutide (OZEMPIC, 0.25 OR 0.5 MG/DOSE, ) Inject 0.5 mg into the skin.    [provider]  tiZANidine (ZANAFLEX) 2 MG tablet TAKE 1 TABLET BY MOUTH ONCE DAILY AT NIGHT AT BEDTIME 06/19/20   Kirsteins,  Luanna Salk, MD  traMADol Veatrice Bourbon) 50 MG tablet Take 1 tablet by mouth twice daily 05/01/20   Kirsteins, Luanna Salk, MD  Vitamin D, Ergocalciferol, (DRISDOL) 1.25 MG (50000 UNIT) CAPS capsule Take 1 capsule  (50,000 Units total) by mouth every 7 (seven) days. 08/10/19   Dennard Nip D, MD    Allergies    Contrast media [iodinated diagnostic agents], Iohexol, Midazolam hcl, Shellfish allergy, Valsartan, Metformin and related, Other, Farxiga [dapagliflozin], Hydralazine, Spironolactone, Avandia [rosiglitazone maleate], Geodon [ziprasidone], Kiwi extract, and Latex  Review of Systems   Review of Systems  Physical Exam Updated Vital Signs BP (!) 181/90 (BP Location: Left Arm)   Pulse 89   Temp 98.5 F (36.9 C)   Resp 20   Ht 1.676 m (5\' 6" )   Wt 109.8 kg   LMP 03/20/2020 (Approximate)   SpO2 98%   BMI 39.06 kg/m   Physical Exam  ED Results / Procedures / Treatments   Labs (all labs ordered are listed, but only abnormal results are displayed) Labs Reviewed - No data to display  EKG None  Radiology No results found.  Procedures Procedures   Medications Ordered in ED Medications - No data to display  ED Course  I have reviewed the triage vital signs and the nursing notes.  Pertinent labs & imaging results that were available during my care of the patient were reviewed by me and considered in my medical decision making (see chart for details).    MDM Rules/Calculators/A&P                          50 year old female presents today with some lip swelling.  She is treated here with Benadryl and prednisone.  On recheck there is improvement of her symptoms.  There is no tongue or oropharyngeal swelling.  Lungs are clear patient is stable.  Plan Benadryl and prednisone.  We have discussed return precautions and need for follow-up and she voiced understanding. Final Clinical Impression(s) / ED Diagnoses Final diagnoses:  Acute allergic reaction, initial encounter    Rx / DC Orders ED Discharge Orders     None        Pattricia Boss, MD 06/26/20 1609

## 2020-07-02 DIAGNOSIS — G4733 Obstructive sleep apnea (adult) (pediatric): Secondary | ICD-10-CM | POA: Diagnosis not present

## 2020-07-03 ENCOUNTER — Encounter (INDEPENDENT_AMBULATORY_CARE_PROVIDER_SITE_OTHER): Payer: HMO | Admitting: Ophthalmology

## 2020-07-09 ENCOUNTER — Encounter: Payer: Self-pay | Admitting: Neurology

## 2020-07-10 DIAGNOSIS — Z0000 Encounter for general adult medical examination without abnormal findings: Secondary | ICD-10-CM | POA: Diagnosis not present

## 2020-07-10 DIAGNOSIS — Z23 Encounter for immunization: Secondary | ICD-10-CM | POA: Diagnosis not present

## 2020-07-10 DIAGNOSIS — M109 Gout, unspecified: Secondary | ICD-10-CM | POA: Diagnosis not present

## 2020-07-10 DIAGNOSIS — J309 Allergic rhinitis, unspecified: Secondary | ICD-10-CM | POA: Diagnosis not present

## 2020-07-10 DIAGNOSIS — G4733 Obstructive sleep apnea (adult) (pediatric): Secondary | ICD-10-CM | POA: Diagnosis not present

## 2020-07-10 DIAGNOSIS — I10 Essential (primary) hypertension: Secondary | ICD-10-CM | POA: Diagnosis not present

## 2020-07-10 DIAGNOSIS — I6930 Unspecified sequelae of cerebral infarction: Secondary | ICD-10-CM | POA: Diagnosis not present

## 2020-07-10 DIAGNOSIS — E1142 Type 2 diabetes mellitus with diabetic polyneuropathy: Secondary | ICD-10-CM | POA: Diagnosis not present

## 2020-07-10 DIAGNOSIS — R011 Cardiac murmur, unspecified: Secondary | ICD-10-CM | POA: Diagnosis not present

## 2020-07-10 DIAGNOSIS — E11319 Type 2 diabetes mellitus with unspecified diabetic retinopathy without macular edema: Secondary | ICD-10-CM | POA: Diagnosis not present

## 2020-07-10 DIAGNOSIS — E785 Hyperlipidemia, unspecified: Secondary | ICD-10-CM | POA: Diagnosis not present

## 2020-07-19 DIAGNOSIS — R011 Cardiac murmur, unspecified: Secondary | ICD-10-CM | POA: Diagnosis not present

## 2020-07-31 ENCOUNTER — Encounter (INDEPENDENT_AMBULATORY_CARE_PROVIDER_SITE_OTHER): Payer: HMO | Admitting: Ophthalmology

## 2020-08-01 DIAGNOSIS — G4733 Obstructive sleep apnea (adult) (pediatric): Secondary | ICD-10-CM | POA: Diagnosis not present

## 2020-08-02 DIAGNOSIS — G4733 Obstructive sleep apnea (adult) (pediatric): Secondary | ICD-10-CM | POA: Diagnosis not present

## 2020-08-13 ENCOUNTER — Encounter (HOSPITAL_COMMUNITY): Payer: Self-pay | Admitting: Family Medicine

## 2020-08-13 ENCOUNTER — Observation Stay (HOSPITAL_COMMUNITY)
Admission: EM | Admit: 2020-08-13 | Discharge: 2020-08-15 | Disposition: A | Discharge status: Home / Self Care | Payer: Federal, State, Local not specified - PPO | Attending: Internal Medicine | Admitting: Internal Medicine

## 2020-08-13 DIAGNOSIS — T783XXD Angioneurotic edema, subsequent encounter: Secondary | ICD-10-CM | POA: Diagnosis not present

## 2020-08-13 DIAGNOSIS — Z91040 Latex allergy status: Secondary | ICD-10-CM | POA: Insufficient documentation

## 2020-08-13 DIAGNOSIS — L299 Pruritus, unspecified: Secondary | ICD-10-CM | POA: Diagnosis not present

## 2020-08-13 DIAGNOSIS — I5032 Chronic diastolic (congestive) heart failure: Secondary | ICD-10-CM | POA: Insufficient documentation

## 2020-08-13 DIAGNOSIS — R609 Edema, unspecified: Secondary | ICD-10-CM | POA: Diagnosis not present

## 2020-08-13 DIAGNOSIS — T783XXA Angioneurotic edema, initial encounter: Principal | ICD-10-CM

## 2020-08-13 DIAGNOSIS — J45909 Unspecified asthma, uncomplicated: Secondary | ICD-10-CM | POA: Diagnosis not present

## 2020-08-13 DIAGNOSIS — R220 Localized swelling, mass and lump, head: Secondary | ICD-10-CM | POA: Diagnosis not present

## 2020-08-13 DIAGNOSIS — E1140 Type 2 diabetes mellitus with diabetic neuropathy, unspecified: Secondary | ICD-10-CM | POA: Insufficient documentation

## 2020-08-13 DIAGNOSIS — T7840XA Allergy, unspecified, initial encounter: Secondary | ICD-10-CM | POA: Diagnosis present

## 2020-08-13 DIAGNOSIS — Z20822 Contact with and (suspected) exposure to covid-19: Secondary | ICD-10-CM | POA: Diagnosis not present

## 2020-08-13 DIAGNOSIS — Z794 Long term (current) use of insulin: Secondary | ICD-10-CM | POA: Diagnosis not present

## 2020-08-13 DIAGNOSIS — Z79899 Other long term (current) drug therapy: Secondary | ICD-10-CM | POA: Diagnosis not present

## 2020-08-13 DIAGNOSIS — I10 Essential (primary) hypertension: Secondary | ICD-10-CM | POA: Diagnosis not present

## 2020-08-13 DIAGNOSIS — T7840XD Allergy, unspecified, subsequent encounter: Secondary | ICD-10-CM

## 2020-08-13 DIAGNOSIS — Z7982 Long term (current) use of aspirin: Secondary | ICD-10-CM | POA: Insufficient documentation

## 2020-08-13 DIAGNOSIS — I110 Hypertensive heart disease with heart failure: Secondary | ICD-10-CM | POA: Insufficient documentation

## 2020-08-13 HISTORY — DX: Angioneurotic edema, initial encounter: T78.3XXA

## 2020-08-13 LAB — CBG MONITORING, ED
Glucose-Capillary: 64 mg/dL — ABNORMAL LOW (ref 70–99)
Glucose-Capillary: 86 mg/dL (ref 70–99)
Glucose-Capillary: 125 mg/dL — ABNORMAL HIGH (ref 70–99)

## 2020-08-13 LAB — HEMOGLOBIN A1C
Hgb A1c MFr Bld: 8.4 % — ABNORMAL HIGH (ref 4.8–5.6)
Mean Plasma Glucose: 194.38 mg/dL

## 2020-08-13 MED ORDER — LABETALOL HCL 5 MG/ML IV SOLN
10.0000 mg | INTRAVENOUS | Status: DC | PRN
  Administered 2020-08-14 (×2): 10 mg via INTRAVENOUS
  Filled 2020-08-13: qty 4

## 2020-08-13 MED ORDER — METHYLPREDNISOLONE SODIUM SUCC 40 MG IJ SOLR
40.0000 mg | Freq: Two times a day (BID) | INTRAMUSCULAR | Status: DC
  Administered 2020-08-14: 40 mg via INTRAVENOUS
  Filled 2020-08-13: qty 1

## 2020-08-13 MED ORDER — DEXTROSE 10 % IV SOLN
100.0000 mL | Freq: Once | INTRAVENOUS | Status: AC
  Administered 2020-08-13: 100 mL via INTRAVENOUS

## 2020-08-13 MED ORDER — LABETALOL HCL 5 MG/ML IV SOLN
20.0000 mg | Freq: Once | INTRAVENOUS | Status: AC
  Administered 2020-08-13: 20 mg via INTRAVENOUS
  Filled 2020-08-13: qty 4

## 2020-08-13 MED ORDER — BASAGLAR KWIKPEN 100 UNIT/ML ~~LOC~~ SOPN: 18.0000 [IU] | PEN_INJECTOR | Freq: Every day | SUBCUTANEOUS | Status: DC

## 2020-08-13 MED ORDER — METHYLPREDNISOLONE SODIUM SUCC 125 MG IJ SOLR
125.0000 mg | Freq: Once | INTRAMUSCULAR | Status: AC
  Administered 2020-08-13: 125 mg via INTRAVENOUS
  Filled 2020-08-13: qty 2

## 2020-08-13 MED ORDER — FAMOTIDINE IN NACL 20-0.9 MG/50ML-% IV SOLN
20.0000 mg | Freq: Two times a day (BID) | INTRAVENOUS | Status: DC
  Administered 2020-08-13 – 2020-08-14 (×3): 20 mg via INTRAVENOUS
  Filled 2020-08-13 (×4): qty 50

## 2020-08-13 MED ORDER — TIZANIDINE HCL 2 MG PO TABS
2.0000 mg | ORAL_TABLET | Freq: Every evening | ORAL | Status: DC | PRN
  Administered 2020-08-13 – 2020-08-14 (×2): 2 mg via ORAL
  Filled 2020-08-13 (×3): qty 1

## 2020-08-13 MED ORDER — CARVEDILOL 25 MG PO TABS
25.0000 mg | ORAL_TABLET | Freq: Two times a day (BID) | ORAL | Status: DC
  Administered 2020-08-13 – 2020-08-15 (×4): 25 mg via ORAL
  Filled 2020-08-13 (×2): qty 1
  Filled 2020-08-13: qty 2
  Filled 2020-08-13: qty 1

## 2020-08-13 MED ORDER — DULOXETINE HCL 20 MG PO CPEP
40.0000 mg | ORAL_CAPSULE | Freq: Every day | ORAL | Status: DC
  Administered 2020-08-13 – 2020-08-14 (×2): 40 mg via ORAL
  Filled 2020-08-13 (×3): qty 2

## 2020-08-13 MED ORDER — PANTOPRAZOLE SODIUM 40 MG PO TBEC
40.0000 mg | DELAYED_RELEASE_TABLET | Freq: Every day | ORAL | Status: DC
  Administered 2020-08-14 – 2020-08-15 (×2): 40 mg via ORAL
  Filled 2020-08-13 (×2): qty 1

## 2020-08-13 MED ORDER — ATORVASTATIN CALCIUM 10 MG PO TABS
10.0000 mg | ORAL_TABLET | Freq: Every day | ORAL | Status: DC
  Administered 2020-08-13 – 2020-08-15 (×3): 10 mg via ORAL
  Filled 2020-08-13 (×3): qty 1

## 2020-08-13 MED ORDER — SODIUM CHLORIDE 0.9 % IV SOLN: 250.0000 mL | INTRAVENOUS | Status: DC | PRN

## 2020-08-13 MED ORDER — ALLOPURINOL 100 MG PO TABS
100.0000 mg | ORAL_TABLET | Freq: Every evening | ORAL | Status: DC
  Administered 2020-08-13 – 2020-08-14 (×2): 100 mg via ORAL
  Filled 2020-08-13 (×2): qty 1

## 2020-08-13 MED ORDER — GABAPENTIN 300 MG PO CAPS
600.0000 mg | ORAL_CAPSULE | Freq: Three times a day (TID) | ORAL | Status: DC
  Administered 2020-08-13 – 2020-08-15 (×5): 600 mg via ORAL
  Filled 2020-08-13 (×5): qty 2

## 2020-08-13 MED ORDER — INSULIN ASPART 100 UNIT/ML IJ SOLN
0.0000 [IU] | Freq: Three times a day (TID) | INTRAMUSCULAR | Status: DC
  Administered 2020-08-13: 1 [IU] via SUBCUTANEOUS
  Administered 2020-08-14 (×3): 9 [IU] via SUBCUTANEOUS
  Administered 2020-08-15: 2 [IU] via SUBCUTANEOUS

## 2020-08-13 MED ORDER — INSULIN ASPART 100 UNIT/ML IJ SOLN
0.0000 [IU] | Freq: Every day | INTRAMUSCULAR | Status: DC
  Administered 2020-08-14: 4 [IU] via SUBCUTANEOUS

## 2020-08-13 MED ORDER — SODIUM CHLORIDE 0.9% FLUSH
3.0000 mL | INTRAVENOUS | Status: DC | PRN
  Administered 2020-08-13: 3 mL via INTRAVENOUS

## 2020-08-13 MED ORDER — FAMOTIDINE IN NACL 20-0.9 MG/50ML-% IV SOLN
20.0000 mg | Freq: Once | INTRAVENOUS | Status: AC
  Administered 2020-08-13: 20 mg via INTRAVENOUS
  Filled 2020-08-13: qty 50

## 2020-08-13 MED ORDER — DULOXETINE HCL 20 MG PO CPEP
20.0000 mg | ORAL_CAPSULE | Freq: Every morning | ORAL | Status: DC
  Administered 2020-08-14 – 2020-08-15 (×2): 20 mg via ORAL
  Filled 2020-08-13 (×3): qty 1

## 2020-08-13 MED ORDER — SODIUM CHLORIDE 0.9% FLUSH
3.0000 mL | Freq: Two times a day (BID) | INTRAVENOUS | Status: DC
  Administered 2020-08-13 – 2020-08-15 (×5): 3 mL via INTRAVENOUS

## 2020-08-13 MED ORDER — ASPIRIN EC 81 MG PO TBEC
81.0000 mg | DELAYED_RELEASE_TABLET | Freq: Every day | ORAL | Status: DC
  Administered 2020-08-13 – 2020-08-15 (×3): 81 mg via ORAL
  Filled 2020-08-13 (×3): qty 1

## 2020-08-13 MED ORDER — CLONIDINE HCL 0.2 MG PO TABS
0.2000 mg | ORAL_TABLET | Freq: Two times a day (BID) | ORAL | Status: DC
  Administered 2020-08-13 – 2020-08-14 (×2): 0.2 mg via ORAL
  Filled 2020-08-13 (×2): qty 1

## 2020-08-13 MED ORDER — ENOXAPARIN SODIUM 60 MG/0.6ML IJ SOSY
50.0000 mg | PREFILLED_SYRINGE | INTRAMUSCULAR | Status: DC
  Administered 2020-08-13 – 2020-08-14 (×2): 50 mg via SUBCUTANEOUS
  Filled 2020-08-13: qty 0.6
  Filled 2020-08-13: qty 0.5

## 2020-08-13 MED ORDER — BASAGLAR KWIKPEN 100 UNIT/ML ~~LOC~~ SOPN: 10.0000 [IU] | PEN_INJECTOR | Freq: Every day | SUBCUTANEOUS | Status: DC

## 2020-08-13 MED ORDER — TRAMADOL HCL 50 MG PO TABS
50.0000 mg | ORAL_TABLET | Freq: Two times a day (BID) | ORAL | Status: DC
Start: 2020-08-13 — End: 2020-08-15
  Administered 2020-08-13 – 2020-08-15 (×4): 50 mg via ORAL
  Filled 2020-08-13 (×4): qty 1

## 2020-08-13 MED ORDER — METHYLPREDNISOLONE SODIUM SUCC 40 MG IJ SOLR: 40.0000 mg | Freq: Two times a day (BID) | INTRAMUSCULAR | Status: DC

## 2020-08-13 MED ORDER — INSULIN GLARGINE-YFGN 100 UNIT/ML ~~LOC~~ SOLN
10.0000 [IU] | Freq: Every day | SUBCUTANEOUS | Status: DC
  Administered 2020-08-13: 10 [IU] via SUBCUTANEOUS
  Filled 2020-08-13 (×2): qty 0.1

## 2020-08-13 MED ORDER — FUROSEMIDE 20 MG PO TABS: 40.0000 mg | ORAL_TABLET | Freq: Every day | ORAL | Status: DC

## 2020-08-13 MED ORDER — FUROSEMIDE 40 MG PO TABS
40.0000 mg | ORAL_TABLET | Freq: Every day | ORAL | Status: DC
  Administered 2020-08-14 – 2020-08-15 (×2): 40 mg via ORAL
  Filled 2020-08-13 (×2): qty 1

## 2020-08-13 NOTE — ED Notes (Signed)
Pt family member alerted RN that the pt's wearable glucose monitor was reading 52. This RN checked the pt's blood sugar using the facility glucometer which read 64. This RN provided pt with juice and graham crackers.

## 2020-08-13 NOTE — ED Triage Notes (Signed)
Pt BIB [facility] from home with swelling to L side of face, arm, and hand starting about 1000. EMS reports pt self-administered epi pen at 1300 and EMS administered '50mg'$  benedryl IV en route. Pt reports itchy feeling in airway but remains uncompromised at this time. Pt reports hx of allergic reaction to medications.

## 2020-08-13 NOTE — H&P (Signed)
History and Physical    Tina [name] [id number] DOB: [birth year] DOA: 08/13/2020  PCP: [name], DO  Patient coming from: Home  I have personally briefly reviewed patient's old medical records in [place]  Chief Complaint: Swelling of the face and lips  HPI: Tina [name] is a 50 y.o. female with medical history significant of angioedema secondary to many medications including ARB, chronic back pain, anxiety, asthma, type 2 diabetes mellitus, diabetic peripheral neuropathy and multiple other problems presented to ED with a complaint of swelling of the left side of the face.  According to patient, she woke up at around 6 AM and around 10 AM, she noted that she had started swelling of the left lower side of the face.  She had not eaten anything during those 4 hours.  There is no other inciting factor that she can remember.  Patient then presented to ED.  She denies any chest pain, shortness of breath, fever, chills, sweating, any other complaint.  She tells me that initially she did not have any swelling of the lips but currently her upper lip is significantly swollen.  Patient is able to talk well.  She complains of some tingling sensation in the chest and back of the throat.  She does not have any trouble speaking or swallowing or breathing.  ED Course: Upon arrival to ED, she was significantly hypertensive with blood pressure of 223/105.  She received 1 dose of labetalol 20 mg.  Other vitals were stable.  Blood sugar was 64.  No CBC or any other basic lab done.  COVID not done either.  Hospitalist were called to admit the patient for further management.  Requested ED physician to order basic labs, CBC, BMP and test COVID as well.  Review of Systems: As per HPI otherwise negative.    Past Medical History:  Diagnosis Date   Allergy    Anemia    DURING MENSES--HAS HEAVY BLEEDING WITH PERODS   Angioedema 08/13/2020   Anxiety    Back pain, chronic    "ongoing"   Blood  transfusion    IN 2012  AFTER C -SECTION   Cerebral thrombosis with cerebral infarction [facility]) JUNE 2011   RIGHT SIDED WEAKNESS ( ARM AND LEG ) AND SPASMS-remains with slight weakness and vertigo.   Constipation    Depression    Diabetes mellitus    Diabetic neuropathy (HCC)    BOTH FEET --COMES AND GOES   Edema, lower extremity    Endometriosis    Fatty liver    GERD (gastroesophageal reflux disease)    with pregnancy   H/O eye surgery    Headache(784.0)    MIGRAINES--NOT REALLY HEADACHE-MORE LIKE PRESSURE SENSATION IN HEAD-FEELS DIZZIY AND  FAINT AS THE PRESSURE RESOLVES   Hernia, incisional, RLQ, s/p lap repair Sep 2013 09/02/2011   History of vertigo 03/22/2018   Hx of migraines 10/19/2011   Hypertension    Leg pain, right    "like bad Charley horse"   Multiple food allergies    Panic disorder without agoraphobia    Rash    HANDS, ARMS --STATES HX OF RASH EVER SINCE CHILDBIRTH/PREGNANCY.  STATES THE RASH OFTEN OCCURS WHEN SHE IS REALLY STRESSED."goes and comes-presently left ring finger"   Restless leg syndrome    DIAGNOSED BY SLEEP STUDY - PT TOLD SHE DID NOT HAVE SLEEP APNEA   Right rotator cuff tear    PAIN IN RIGHT SHOULDER   SBO (small bowel obstruction) ([place])  06/03/2012   Shortness of breath    Spastic hemiplegia affecting dominant side (HCC)    Stomach problems    Stroke (HCC)    Ventral hernia    RIGHT LOWER QUADRANT-CAUSING SOME PAIN   Weakness of right side of body     Past Surgical History:  Procedure Laterality Date   APPLICATION OF WOUND VAC N/A 05/25/2015   Procedure: APPLICATION OF WOUND VAC;  Surgeon: Michael Boston, MD;  [facility]: [facility];  Service: General;  Laterality: N/A;   CESAREAN SECTION  2012   COLONOSCOPY     DIAGNOSTIC LAPAROSCOPY     ESOPHAGOGASTRODUODENOSCOPY N/A 06/03/2012   Procedure: ESOPHAGOGASTRODUODENOSCOPY (EGD);  Surgeon: [name] Craver, MD;  Location: [facility] ENDOSCOPY;  Service: Endoscopy;  Laterality: N/A;   EXCISION MASS ABDOMINAL N/A  05/25/2015   Procedure: ABDOMINAL WALL EXPLORATION EXCISION OF SEROMA REMOVAL OF REDUNDANT SKIN ;  Surgeon: Michael Boston, MD;  Location: [facility];  Service: General;  Laterality: N/A;   EYE SURGERY     Eye laser for vessel hemorrhaging   fybroid removal     HERNIA REPAIR  10/03/11   ventral hernia repair   INSERTION OF MESH N/A 02/04/2013   Procedure: INSERTION OF MESH;  Surgeon: Adin Hector, MD;  [facility];  Service: General;  Laterality: N/A;   UMBILICAL HERNIA REPAIR N/A 02/04/2013   Procedure: LAPAROSCOPIC ventral wall hernia repair LAPAROSCOPIC LYSIS OF ADHESIONS laparoscopic exploration of abdomen ;  Surgeon: Adin Hector, MD;  Location: [facility];  Service: General;  Laterality: N/A;   UPPER GASTROINTESTINAL ENDOSCOPY     URETER REVISION     Bilateral "twisted"   UTERINE FIBROID SURGERY     2 SURGERIES FOR FIBROIDS   VENTRAL HERNIA REPAIR  10/03/2011   Procedure: LAPAROSCOPIC VENTRAL HERNIA;  Surgeon: Adin Hector, MD;  [facility];  Service: General;  Laterality: N/A;     reports that she has never smoked. She has never used smokeless tobacco. She reports that she does not drink alcohol and does not use drugs.  Allergies  Allergen Reactions   Amlodipine Benzoate Swelling and Other (See Comments)    Leg swelling   Contrast Media [Iodinated Diagnostic Agents] Shortness Of Breath and Other (See Comments)    Difficulty breathing   Iohexol Hives, Nausea And Vomiting and Swelling     Desc: Magnevist-gadolinium-difficulty breathing, throat swelling    Midazolam Hcl Anaphylaxis    Difficulty breathing   Shellfish Allergy Anaphylaxis   Valsartan Swelling    Other reaction(s): Unknown   Metformin And Related Diarrhea and Nausea And Vomiting   Other Itching and Other (See Comments)    Patient is allergic to all nuts except peanuts, which are NOT "nuts"- they are legumes   [place] [Dapagliflozin] Nausea Only and Other (See Comments)    Nausea, UTI, headaches and muscle  aches   Hydralazine    Iodine     Other reaction(s): Unknown   Saxagliptin     ONGLYZA   Shellfish-Derived Products    Spironolactone Other (See Comments)    Elevated potassium  Other reaction(s): severe hyperkalemia   Avandia [Rosiglitazone Maleate] Hives and Other (See Comments)   Geodon [Ziprasidone] Other (See Comments)    UNKNOWN   Kiwi Extract Itching and Swelling   Latex Itching    Other reaction(s): Unknown    Family History  Problem Relation Age of Onset   Diabetes Father    Kidney disease Father    Depression Father  Drug abuse Father    Allergic rhinitis Mother    Eczema Mother    Urticaria Mother    Depression Mother    Anxiety disorder Mother    Bipolar disorder Mother    Alcoholism Mother    Drug abuse Mother    Eating disorder Mother    Diabetes Maternal Grandmother    Hyperlipidemia Paternal Grandmother    Stroke Paternal Grandmother    Eczema Sister    Urticaria Sister    Colon cancer Paternal Uncle    Other Neg Hx    Angioedema Neg Hx    Asthma Neg Hx    Colon polyps Neg Hx    Esophageal cancer Neg Hx    Rectal cancer Neg Hx    Stomach cancer Neg Hx     Prior to Admission medications   Medication Sig Start Date End Date Taking? Authorizing Provider  allopurinol (ZYLOPRIM) 100 MG tablet Take 100 mg by mouth every evening. 03/13/14   [provider]  amLODipine (NORVASC) 10 MG tablet Take 10 mg by mouth daily. Patient not taking: No sig reported 09/04/19   [provider]  aspirin EC 81 MG tablet Take 81 mg by mouth daily. Swallow whole.    [provider]  atorvastatin (LIPITOR) 10 MG tablet Take 1 tablet (10 mg total) by mouth daily. 06/23/18   Whitmire, Joneen Boers, FNP  carvedilol (COREG) 25 MG tablet Take 25 mg by mouth 2 (two) times daily. 04/02/20   [provider]  cloNIDine (CATAPRES) 0.2 MG tablet Take 0.2 mg by mouth 2 (two) times daily. 08/13/19   [provider]  Continuous Blood Gluc Receiver  (FREESTYLE LIBRE 2 READER) DEVI daily. as directed 04/13/19   [provider]  Continuous Blood Gluc Sensor (FREESTYLE LIBRE 2 SENSOR) MISC USE 1 SENSOR EVERY 14 DAYS AS DIRECTED 08/23/19   [provider]  DULoxetine (CYMBALTA) 20 MG capsule Take 20 mg by mouth daily. 20 mg am, 40 mg pm    [provider]  EPINEPHrine 0.3 mg/0.3 mL IJ SOAJ injection INJECT AS DIRECTED AS NEEDED FOR ALLERGIC REACTION 09/08/17   Kozlow, Donnamarie Poag, MD  fluconazole (DIFLUCAN) 100 MG tablet Take 1 tablet (100 mg total) by mouth daily. Patient not taking: No sig reported 12/01/19   Landis Martins, DPM  furosemide (LASIX) 40 MG tablet Take 40 mg by mouth.    [provider]  gabapentin (NEURONTIN) 300 MG capsule TAKE 2 CAPSULES BY MOUTH THREE TIMES DAILY 08/15/19   Kirsteins, Luanna Salk, MD  insulin aspart (NOVOLOG) 100 UNIT/ML injection Inject 5-15 Units into the skin 3 (three) times daily with meals. Per sliding scale--pt uses Omnipod and gets a basal rate    [provider]  Insulin Glargine (BASAGLAR KWIKPEN) 100 UNIT/ML SMARTSIG:18 Unit(s) SUB-Q Every Night 04/09/19   [provider]  omeprazole (PRILOSEC) 40 MG capsule Take 1 capsule by mouth once daily 02/09/19   Armbruster, Carlota Raspberry, MD  predniSONE (DELTASONE) 10 MG tablet Take 2 tablets (20 mg total) by mouth in the morning and at bedtime. 06/26/20   Pattricia Boss, MD  Semaglutide (OZEMPIC, 0.25 OR 0.5 MG/DOSE, [place]) Inject 0.5 mg into the skin.    [provider]  tiZANidine (ZANAFLEX) 2 MG tablet TAKE 1 TABLET BY MOUTH ONCE DAILY AT NIGHT AT BEDTIME 06/19/20   Kirsteins, Luanna Salk, MD  traMADol Veatrice [place]bon) 50 MG tablet Take 1 tablet by mouth twice daily 05/01/20   Kirsteins, Luanna Salk, MD  Vitamin D, Ergocalciferol, (DRISDOL) 1.25 MG (50000 UNIT) CAPS capsule Take 1 capsule (50,000 Units total) by mouth every 7 (seven) days. 08/10/19   Dennard Nip D, MD    Physical Exam: Vitals:   08/13/20 1442 08/13/20 1520  BP:  (!) 223/105 (!) 242/97  Pulse: 71 70  Resp: 16 15  Temp: 98.8 F (37.1 C)   TempSrc: Oral   SpO2: 100% 100%    Constitutional: NAD, calm, comfortable Vitals:   08/13/20 1442 08/13/20 1520  BP: (!) 223/105 (!) 242/97  Pulse: 71 70  Resp: 16 15  Temp: 98.8 F (37.1 C)   TempSrc: Oral   SpO2: 100% 100%   Eyes: PERRL, lids and conjunctivae normal ENMT: Mucous membranes are moist.  Significant swelling of the upper lip. Neck: normal, supple, no masses, no thyromegaly Respiratory: clear to auscultation bilaterally, no wheezing, no crackles. Normal respiratory effort. No accessory muscle use.  Cardiovascular: Regular rate and rhythm, no murmurs / rubs / gallops. No extremity edema. 2+ pedal pulses. No carotid bruits.  Abdomen: no tenderness, no masses palpated. No hepatosplenomegaly. Bowel sounds positive.  Musculoskeletal: no clubbing / cyanosis. No joint deformity upper and lower extremities. Good ROM, no contractures. Normal muscle tone.  Skin: no rashes, lesions, ulcers. No induration Neurologic: CN 2-12 grossly intact. Sensation intact, DTR normal. Strength 5/5 in all 4.  Psychiatric: Normal judgment and insight. Alert and oriented x 3. Normal mood.    Labs on Admission: I have personally reviewed following labs and imaging studies  CBC: No results for input(s): WBC, NEUTROABS, HGB, HCT, MCV, PLT in the last 168 hours. Basic Metabolic Panel: No results for input(s): NA, K, CL, CO2, GLUCOSE, BUN, CREATININE, CALCIUM, MG, PHOS in the last 168 hours. GFR: CrCl cannot be calculated (Patient's most recent lab result is older than the maximum 21 days allowed.). Liver Function Tests: No results for input(s): AST, ALT, ALKPHOS, BILITOT, PROT, ALBUMIN in the last 168 hours. No results for input(s): LIPASE, AMYLASE in the last 168 hours. No results for input(s): AMMONIA in the last 168 hours. Coagulation Profile: No results for input(s): INR, PROTIME in the last 168 hours. Cardiac  Enzymes: No results for input(s): CKTOTAL, CKMB, CKMBINDEX, TROPONINI in the last 168 hours. BNP (last 3 results) No results for input(s): PROBNP in the last 8760 hours. HbA1C: No results for input(s): HGBA1C in the last 72 hours. CBG: Recent Labs  Lab 08/13/20 1516 08/13/20 1603  GLUCAP 64* 86   Lipid Profile: No results for input(s): CHOL, HDL, LDLCALC, TRIG, CHOLHDL, LDLDIRECT in the last 72 hours. Thyroid Function Tests: No results for input(s): TSH, T4TOTAL, FREET4, T3FREE, THYROIDAB in the last 72 hours. Anemia Panel: No results for input(s): VITAMINB12, FOLATE, FERRITIN, TIBC, IRON, RETICCTPCT in the last 72 hours. Urine analysis:    Component Value Date/Time   COLORURINE YELLOW 04/20/2018 1537   APPEARANCEUR CLEAR 04/20/2018 1537   LABSPEC 1.013 04/20/2018 1537   PHURINE 5.0 04/20/2018 1537   GLUCOSEU >=500 (A) 04/20/2018 1537   HGBUR LARGE (A) 04/20/2018 1537   BILIRUBINUR NEGATIVE 04/20/2018 1537   KETONESUR NEGATIVE 04/20/2018 1537   PROTEINUR 100 (A) 04/20/2018 1537   UROBILINOGEN 0.2 08/18/2014 1423   NITRITE NEGATIVE 04/20/2018 1537   LEUKOCYTESUR NEGATIVE 04/20/2018 1537    Radiological Exams on Admission: No results found.  EKG: Not done.  Assessment/Plan Active Problems:   Allergic reaction   Angioedema   Allergic reaction/angioedema: This appears to be angioedema however it is challenging to point fingers  to any inciting factor.  Reportedly, she was administered EpiPen at 1300 mg of Benadryl in route as well.  She received a dose of Solu-Medrol, famotidine in the ED as well.  I will continue Solu-Medrol and famotidine.  Admit in progressive care unit.  ED physician had informed me that he was going to talk to ENT to consult on the patient as well.  Hypertensive urgency: Significantly elevated blood pressure.  She received labetalol in the ED.  I will resume all her home medications and add labetalol as needed.  She is allergic to hydralazine.  Monitor  closely.  Hyperlipidemia: Resume statin.  Uncontrolled type 2 diabetes mellitus with hyperglycemia: She takes 18 units of Lantus at home.  Due to hypoglycemia, I will resume at 10 units and add SSI.  DVT prophylaxis:   Lovenox Code Status: Full code Family Communication: Spouse present at bedside.  Plan of care discussed with patient in length and he verbalized understanding and agreed with it. Disposition Plan: Potential discharge in next 1 to 2 days Consults called: ENT called by ED physician Admission status: Observation   Status is: Observation  The patient remains OBS appropriate and will d/c before 2 midnights.  Dispo: The patient is from: Home              Anticipated d/c is to: Home              Patient currently is not medically stable to d/c.   Difficult to place patient No       Darliss Cheney MD [facility]  08/13/2020, 4:29 PM  To contact the attending provider between 7A-7P or the covering provider during after hours 7P-7A, please log into the web site [email or web address]

## 2020-08-13 NOTE — Consult Note (Signed)
ENT CONSULT:  Reason for Consult: Angioedema  Referring Physician:  ED Provider  HPI: Tina [name] is an 50 y.o. female with past medical history of angioedema secondary to many medications, chronic back pain, anxiety, asthma, type 2 diabetes mellitus, diabetic peripheral neuropathy and multiple other problems who presented to ED with complaints of left sided facial swelling. She states this started spontaneously this morning and cannot identify an inciting event. She denies shortness of breath, dysphonia, dysphagia, odynophagia.    Past Medical History:  Diagnosis Date   Allergy    Anemia    DURING MENSES--HAS HEAVY BLEEDING WITH PERODS   Angioedema 08/13/2020   Anxiety    Back pain, chronic    "ongoing"   Blood transfusion    IN 2012  AFTER C -SECTION   Cerebral thrombosis with cerebral infarction [facility]) JUNE 2011   RIGHT SIDED WEAKNESS ( ARM AND LEG ) AND SPASMS-remains with slight weakness and vertigo.   Constipation    Depression    Diabetes mellitus    Diabetic neuropathy (HCC)    BOTH FEET --COMES AND GOES   Edema, lower extremity    Endometriosis    Fatty liver    GERD (gastroesophageal reflux disease)    with pregnancy   H/O eye surgery    Headache(784.0)    MIGRAINES--NOT REALLY HEADACHE-MORE LIKE PRESSURE SENSATION IN HEAD-FEELS DIZZIY AND  FAINT AS THE PRESSURE RESOLVES   Hernia, incisional, RLQ, s/p lap repair Sep 2013 09/02/2011   History of vertigo 03/22/2018   Hx of migraines 10/19/2011   Hypertension    Leg pain, right    "like bad Charley horse"   Multiple food allergies    Panic disorder without agoraphobia    Rash    HANDS, ARMS --STATES HX OF RASH EVER SINCE CHILDBIRTH/PREGNANCY.  STATES THE RASH OFTEN OCCURS WHEN SHE IS REALLY STRESSED."goes and comes-presently left ring finger"   Restless leg syndrome    DIAGNOSED BY SLEEP STUDY - PT TOLD SHE DID NOT HAVE SLEEP APNEA   Right rotator cuff tear    PAIN IN RIGHT SHOULDER   SBO (small bowel  obstruction) ([place]) 06/03/2012   Shortness of breath    Spastic hemiplegia affecting dominant side (HCC)    Stomach problems    Stroke (HCC)    Ventral hernia    RIGHT LOWER QUADRANT-CAUSING SOME PAIN   Weakness of right side of body     Past Surgical History:  Procedure Laterality Date   APPLICATION OF WOUND VAC N/A 05/25/2015   Procedure: APPLICATION OF WOUND VAC;  Surgeon: Michael Boston, MD;  [facility];  Service: General;  Laterality: N/A;   CESAREAN SECTION  2012   COLONOSCOPY     DIAGNOSTIC LAPAROSCOPY     ESOPHAGOGASTRODUODENOSCOPY N/A 06/03/2012   Procedure: ESOPHAGOGASTRODUODENOSCOPY (EGD);  Surgeon: [name] Craver, MD;  Location[facility] ENDOSCOPY;  Service: Endoscopy;  Laterality: N/A;   EXCISION MASS ABDOMINAL N/A 05/25/2015   Procedure: ABDOMINAL WALL EXPLORATION EXCISION OF SEROMA REMOVAL OF REDUNDANT SKIN ;  Surgeon: Michael Boston, MD;  [facility];  Service: General;  Laterality: N/A;   EYE SURGERY     Eye laser for vessel hemorrhaging   fybroid removal     HERNIA REPAIR  10/03/11   ventral hernia repair   INSERTION OF MESH N/A 02/04/2013   Procedure: INSERTION OF MESH;  Surgeon: Adin Hector, MD;  [facility];  Service: General;  Laterality: N/A;   UMBILICAL HERNIA REPAIR N/A 02/04/2013  Procedure: LAPAROSCOPIC ventral wall hernia repair LAPAROSCOPIC LYSIS OF ADHESIONS laparoscopic exploration of abdomen ;  Surgeon: Adin Hector, MD;  [facility];  Service: General;  Laterality: N/A;   UPPER GASTROINTESTINAL ENDOSCOPY     URETER REVISION     Bilateral "twisted"   UTERINE FIBROID SURGERY     2 SURGERIES FOR FIBROIDS   VENTRAL HERNIA REPAIR  10/03/2011   Procedure: LAPAROSCOPIC VENTRAL HERNIA;  Surgeon: Adin Hector, MD;  [facility];  Service: General;  Laterality: N/A;    Family History  Problem Relation Age of Onset   Diabetes Father    Kidney disease Father    Depression Father    Drug abuse Father    Allergic rhinitis Mother    Eczema  Mother    Urticaria Mother    Depression Mother    Anxiety disorder Mother    Bipolar disorder Mother    Alcoholism Mother    Drug abuse Mother    Eating disorder Mother    Diabetes Maternal Grandmother    Hyperlipidemia Paternal Grandmother    Stroke Paternal Grandmother    Eczema Sister    Urticaria Sister    Colon cancer Paternal Uncle    Other Neg Hx    Angioedema Neg Hx    Asthma Neg Hx    Colon polyps Neg Hx    Esophageal cancer Neg Hx    Rectal cancer Neg Hx    Stomach cancer Neg Hx     Social History:  reports that she has never smoked. She has never used smokeless tobacco. She reports that she does not drink alcohol and does not use drugs.  Allergies:  Allergies  Allergen Reactions   Amlodipine Benzoate Swelling and Other (See Comments)    Leg swelling   Contrast Media [Iodinated Diagnostic Agents] Shortness Of Breath and Other (See Comments)    Difficulty breathing   Iodine Anaphylaxis   Iohexol Hives, Nausea And Vomiting, Swelling and Other (See Comments)     Desc: Magnevist-gadolinium-difficulty breathing, throat swelling    Midazolam Hcl Anaphylaxis    Difficulty breathing   Other Shortness Of Breath, Itching and Other (See Comments)    Patient is allergic to all nuts except peanuts, which are NOT "nuts"- they are legumes   Shellfish Allergy Anaphylaxis   Shellfish-Derived Products Anaphylaxis   Valsartan Swelling   Metformin And Related Diarrhea, Nausea And Vomiting and Other (See Comments)    Dehydration, also   [place] [Dapagliflozin] Nausea Only and Other (See Comments)    UTI, headaches and muscle aches, also   Hydralazine Other (See Comments)    Reaction not recalled   Saxagliptin Hives, Nausea And Vomiting and Other (See Comments)    ONGLYZA- dehydration, also   Spironolactone Other (See Comments)    Elevated potassium- Severe hyperkalemia   Avandia [Rosiglitazone Maleate] Hives and Other (See Comments)   Geodon [Ziprasidone] Other (See  Comments)    Reaction not recalled   Kiwi Extract Itching and Swelling   Latex Itching    Medications: I have reviewed the patient's current medications.  Results for orders placed or performed during the hospital encounter of 08/13/20 (from the past 48 hour(s))  CBG monitoring, ED     Status: Abnormal   Collection Time: 08/13/20  3:16 PM  Result Value Ref Range   Glucose-Capillary 64 (L) 70 - 99 mg/dL    Comment: Glucose reference range applies only to samples taken after fasting for at least 8 hours.  CBG monitoring, ED (now and then every hour for 3 hours)     Status: None   Collection Time: 08/13/20  4:03 PM  Result Value Ref Range   Glucose-Capillary 86 70 - 99 mg/dL    Comment: Glucose reference range applies only to samples taken after fasting for at least 8 hours.   Comment 1 Notify RN   Hemoglobin A1c     Status: Abnormal   Collection Time: 08/13/20  4:44 PM  Result Value Ref Range   Hgb A1c MFr Bld 8.4 (H) 4.8 - 5.6 %    Comment: (NOTE) Pre diabetes:          5.7%-6.4%  Diabetes:              >6.4%  Glycemic control for   <7.0% adults with diabetes    Mean Plasma Glucose 194.38 mg/dL    Comment: Performed at [facility] [address] [place], [place] [address]  CBG monitoring, ED (now and then every hour for 3 hours)     Status: Abnormal   Collection Time: 08/13/20  5:06 PM  Result Value Ref Range   Glucose-Capillary 125 (H) 70 - 99 mg/dL    Comment: Glucose reference range applies only to samples taken after fasting for at least 8 hours.    No results found.  ROS:ROS  Blood pressure (!) 242/97, pulse 70, temperature 98.8 F (37.1 C), temperature source Oral, resp. rate 15, weight 110 kg, SpO2 100 %.  PHYSICAL EXAM: CONSTITUTIONAL: well developed, nourished, no distress and alert and oriented x 3 PULMONARY/CHEST WALL: effort normal and no stridor, no stertor, no dysphonia HENT: Head : normocephalic and atraumatic. Mild left facial edema.  Ears:  Right ear:   canal normal, external ear normal and hearing normal Left ear:   canal normal, external ear normal and hearing normal Nose: nose normal and no purulence Mouth/Throat:  Mouth: uvula midline and no oral lesions. Tongue with no appreciable edema, normal range of motion. Floor of mouth soft, no edema. Upper and lower lip swelling noted, L>R. Normal clear saliva draining from Stenson's ducts bilaterally. Throat: oropharynx clear and moist Mucous membranes: normal EYES: conjunctiva normal, EOM normal and PERRL NECK: supple, trachea normal and no thyromegaly or cervical LAD  Studies Reviewed:None   Procedure: Transnasal fiberoptic laryngoscopy Anesth: Topical with 4% lidocaine Compl: None Findings: Normal laryngeal anatomy with no evidence of obstruction. Bilateral true vocal fold movement intact. Description:  After discussing risks, benefits, and alternatives, the patient was placed in a seated position and the right nasal passage was sprayed with topical anesthetic.  The fiberoptic scope was passed through the right nasal passage to view the pharynx and larynx.  Findings are noted above.  The scope was then removed and he was returned to nursing care in stable condition.   Assessment/Plan: Tina [name] is a 45 F with complex PMH including history of angioedema presenting with sudden onset of left facial swelling this morning c/w allergic reaction vs angioedema. -FFL performed at bedside with above findings. No evidence of upper airway obstruction. -Recommend treatment with Decadron, PPI, antihistamine, Famotidine -Ok for diet from ENT perspective -Medical management/disposition as per primary. ENT will sign off.   Thank you for allowing me to participate in the care of this patient. Please do not hesitate to contact me with any questions or concerns.   Jason Coop, [facility] Cell: [phone]   08/13/2020, 7:35 PM

## 2020-08-13 NOTE — ED Provider Notes (Signed)
[facility] EMERGENCY DEPARTMENT Provider Note   CSN: [id number] Arrival date & time: 08/13/20  1433     History Chief Complaint  Patient presents with   Allergic Reaction    Tina [name] is a 50 y.o. female.  The history is provided by the patient, the EMS personnel and medical records.  Allergic Reaction Presenting symptoms: itching and swelling   Presenting symptoms: no difficulty breathing, no difficulty swallowing, no rash and no wheezing   Severity:  Moderate Prior allergic episodes:  Allergies to medications Context: medications   Relieved by:  Nothing Worsened by:  Nothing Ineffective treatments:  Antihistamines  50 year old female with past medical history of multiple episodes of lip and mouth and facial swelling after multiple different medications who presents to the emergency department with diffuse left lip swelling that has been progressing.  The patient states that she has an EpiPen at home and self administered her EpiPen when she developed the swelling.  EMS arrived on scene and found the patient to have diffuse swelling and itching with no clear urticaria, no wheezing, no nausea or vomiting, protecting her airway.  She was administered 50 mg of IV Benadryl in route.  She arrived to [place] intact, with worsening findings concerning for angioedema, no shortness of breath, dysphonia, dysphagia, drooling, odynophagia.  Past Medical History:  Diagnosis Date   Allergy    Anemia    DURING MENSES--HAS HEAVY BLEEDING WITH PERODS   Angioedema 08/13/2020   Anxiety    Back pain, chronic    "ongoing"   Blood transfusion    IN 2012  AFTER C -SECTION   Cerebral thrombosis with cerebral infarction [facility]) JUNE 2011   RIGHT SIDED WEAKNESS ( ARM AND LEG ) AND SPASMS-remains with slight weakness and vertigo.   Constipation    Depression    Diabetes mellitus    Diabetic neuropathy (HCC)    BOTH FEET --COMES AND GOES   Edema, lower  extremity    Endometriosis    Fatty liver    GERD (gastroesophageal reflux disease)    with pregnancy   H/O eye surgery    Headache(784.0)    MIGRAINES--NOT REALLY HEADACHE-MORE LIKE PRESSURE SENSATION IN HEAD-FEELS DIZZIY AND  FAINT AS THE PRESSURE RESOLVES   Hernia, incisional, RLQ, s/p lap repair Sep 2013 09/02/2011   History of vertigo 03/22/2018   Hx of migraines 10/19/2011   Hypertension    Leg pain, right    "like bad Charley horse"   Multiple food allergies    Panic disorder without agoraphobia    Rash    HANDS, ARMS --STATES HX OF RASH EVER SINCE CHILDBIRTH/PREGNANCY.  STATES THE RASH OFTEN OCCURS WHEN SHE IS REALLY STRESSED."goes and comes-presently left ring finger"   Restless leg syndrome    DIAGNOSED BY SLEEP STUDY - PT TOLD SHE DID NOT HAVE SLEEP APNEA   Right rotator cuff tear    PAIN IN RIGHT SHOULDER   SBO (small bowel obstruction) ([place]) 06/03/2012   Shortness of breath    Spastic hemiplegia affecting dominant side (HCC)    Stomach problems    Stroke (HCC)    Ventral hernia    RIGHT LOWER QUADRANT-CAUSING SOME PAIN   Weakness of right side of body     Patient Active Problem List   Diagnosis Date Noted   Allergic reaction 08/13/2020   Angioedema 08/13/2020   Type 2 macular telangiectasis, bilateral 01/03/2020   Excessive daytime sleepiness 09/12/2019   Sleep paralysis  09/12/2019   Hypnopompic hallucination 09/12/2019   Abnormal dreams 09/12/2019   Diabetic macular edema of right eye with proliferative retinopathy associated with type 2 diabetes mellitus ([place]) 07/04/2019   Stable treated proliferative diabetic retinopathy of left eye determined by examination associated with type 2 diabetes mellitus ([place]) 07/04/2019   Long-term insulin use ([place]) 04/07/2019   Depression 04/13/2018   History of vertigo 03/22/2018   Laboratory examination 03/22/2018   Chronic diastolic (congestive) heart failure ([place]) 03/22/2018   Other hyperlipidemia 02/15/2018   Vitamin D  deficiency 12/31/2017   Class 2 severe obesity with serious comorbidity and body mass index (BMI) of 39.0 to 39.9 in adult ([place]) 11/18/2017   Gait disturbance, post-stroke 04/23/2017   Strain of gluteus medius 04/23/2017   OSA (obstructive sleep apnea) 10/17/2015   Periodic limb movement disorder (PLMD) 10/17/2015   Essential hypertension 05/26/2015   Diabetes mellitus ([place]) 05/26/2015   Abdominal wall mass of right lower quadrant 05/25/2015   Hemiparesis affecting right side as late effect of stroke ([place]) 11/09/2014   Abdominal wall seroma    Sepsis due to undetermined organism ([place]) 03/06/2014   Nausea vomiting and diarrhea 03/06/2014   Sepsis ([place]) 03/06/2014   Tension headache 09/01/2013   Nausea with vomiting ?Gastroparesis? 03/21/2013   Spastic hemiplegia affecting dominant side ([place]) 02/16/2013   Recurrent ventral incisional hernia s/p closure/repair w mesh 02/04/2013 01/26/2013   Constipation, chronic 01/26/2013   Abdominal pain, chronic, right lower quadrant 06/03/2012   History of CVA (cerebrovascular accident) 06/03/2012   HTN (hypertension) 06/03/2012   Diabetes mellitus type II, uncontrolled ([place]) 10/16/2011   Obesity (BMI 30-39.9) 09/02/2011   Rotator cuff tear 08/25/2011   Sciatica 06/10/2011   Paresthesias in right hand 06/10/2011   Lumbago 05/09/2011   Paresthesias 05/09/2011    Past Surgical History:  Procedure Laterality Date   APPLICATION OF WOUND VAC N/A 05/25/2015   Procedure: APPLICATION OF WOUND VAC;  Surgeon: Michael Boston, MD;  Location: [facility]S;  Service: General;  Laterality: N/A;   CESAREAN SECTION  2012   COLONOSCOPY     DIAGNOSTIC LAPAROSCOPY     ESOPHAGOGASTRODUODENOSCOPY N/A 06/03/2012   Procedure: ESOPHAGOGASTRODUODENOSCOPY (EGD);  Surgeon: [name] Craver, MD;  Location: [facility] ENDOSCOPY;  Service: Endoscopy;  Laterality: N/A;   EXCISION MASS ABDOMINAL N/A 05/25/2015   Procedure: ABDOMINAL WALL EXPLORATION EXCISION OF SEROMA REMOVAL OF REDUNDANT SKIN ;   Surgeon: Michael Boston, MD;  Location: [facility]S;  Service: General;  Laterality: N/A;   EYE SURGERY     Eye laser for vessel hemorrhaging   fybroid removal     HERNIA REPAIR  10/03/11   ventral hernia repair   INSERTION OF MESH N/A 02/04/2013   Procedure: INSERTION OF MESH;  Surgeon: Adin Hector, MD;  Location: [facility];  Service: General;  Laterality: N/A;   UMBILICAL HERNIA REPAIR N/A 02/04/2013   Procedure: LAPAROSCOPIC ventral wall hernia repair LAPAROSCOPIC LYSIS OF ADHESIONS laparoscopic exploration of abdomen ;  Surgeon: Adin Hector, MD;  Location: [facility];  Service: General;  Laterality: N/A;   UPPER GASTROINTESTINAL ENDOSCOPY     URETER REVISION     Bilateral "twisted"   UTERINE FIBROID SURGERY     2 SURGERIES FOR FIBROIDS   VENTRAL HERNIA REPAIR  10/03/2011   Procedure: LAPAROSCOPIC VENTRAL HERNIA;  Surgeon: Adin Hector, MD;  Location: [facility];  Service: General;  Laterality: N/A;     OB History     Gravida  2   Para  2  Term  0   Preterm  1   AB  0   Living  1      SAB  0   IAB  0   Ectopic  0   Multiple  0   Live Births              Family History  Problem Relation Age of Onset   Diabetes Father    Kidney disease Father    Depression Father    Drug abuse Father    Allergic rhinitis Mother    Eczema Mother    Urticaria Mother    Depression Mother    Anxiety disorder Mother    Bipolar disorder Mother    Alcoholism Mother    Drug abuse Mother    Eating disorder Mother    Diabetes Maternal Grandmother    Hyperlipidemia Paternal Grandmother    Stroke Paternal Grandmother    Eczema Sister    Urticaria Sister    Colon cancer Paternal Uncle    Other Neg Hx    Angioedema Neg Hx    Asthma Neg Hx    Colon polyps Neg Hx    Esophageal cancer Neg Hx    Rectal cancer Neg Hx    Stomach cancer Neg Hx     Social History   Tobacco Use   Smoking status: Never   Smokeless tobacco: Never  Vaping Use   Vaping Use: Never used  Substance Use  Topics   Alcohol use: No    Alcohol/week: 0.0 standard drinks   Drug use: No    Home Medications Prior to Admission medications   Medication Sig Start Date End Date Taking? Authorizing Provider  allopurinol (ZYLOPRIM) 100 MG tablet Take 100 mg by mouth at bedtime. 03/13/14  Yes [provider]  aspirin EC 81 MG tablet Take 81 mg by mouth at bedtime. Swallow whole.   Yes [provider]  atorvastatin (LIPITOR) 10 MG tablet Take 1 tablet (10 mg total) by mouth daily. Patient taking differently: Take 10 mg by mouth at bedtime. 06/23/18  Yes Whitmire, Dawn W, FNP  carvedilol (COREG) 25 MG tablet Take 25 mg by mouth 2 (two) times daily. 04/02/20  Yes [provider]  cloNIDine (CATAPRES) 0.2 MG tablet Take 0.4 mg by mouth 2 (two) times daily. 08/13/19  Yes [provider]  Continuous Blood Gluc Sensor (FREESTYLE LIBRE 2 SENSOR) MISC Inject 1 Device into the skin every 14 (fourteen) days. 08/23/19  Yes [provider]  DULoxetine (CYMBALTA) 20 MG capsule Take 20-40 mg by mouth See admin instructions. Take 20 mg by mouth in the morning and 40 mg at bedtime   Yes [provider]  EPINEPHrine 0.3 mg/0.3 mL IJ SOAJ injection INJECT AS DIRECTED AS NEEDED FOR ALLERGIC REACTION Patient taking differently: Inject 0.3 mg into the muscle as needed (AS DIRECTED FOR ALLERGIC REACTION). 09/08/17  Yes Kozlow, Donnamarie Poag, MD  furosemide (LASIX) 40 MG tablet Take 40 mg by mouth in the morning.   Yes [provider]  gabapentin (NEURONTIN) 300 MG capsule TAKE 2 CAPSULES BY MOUTH THREE TIMES DAILY Patient taking differently: Take 300 mg by mouth at bedtime. 08/15/19  Yes Kirsteins, Luanna Salk, MD  insulin aspart (NOVOLOG) 100 UNIT/ML injection Inject 5-15 Units into the skin See admin instructions. Inject 5-15 units into the skin three times a day with meals, PER SLIDING SCALE   Yes [provider]  Insulin Glargine (BASAGLAR KWIKPEN) 100 UNIT/ML Inject 18  Units  into the skin at bedtime. 04/09/19  Yes [provider]  isosorbide mononitrate (IMDUR) 60 MG 24 hr tablet Take 60 mg by mouth daily.   Yes [provider]  Semaglutide (OZEMPIC, 0.25 OR 0.5 MG/DOSE, [place]) Inject 0.5 mg into the skin every Saturday.   Yes [provider]  tiZANidine (ZANAFLEX) 2 MG tablet TAKE 1 TABLET BY MOUTH ONCE DAILY AT NIGHT AT BEDTIME Patient taking differently: Take 2 mg by mouth at bedtime. 06/19/20  Yes Kirsteins, Luanna Salk, MD  traMADol (ULTRAM) 50 MG tablet Take 1 tablet by mouth twice daily Patient taking differently: Take 50 mg by mouth at bedtime. 05/01/20  Yes Kirsteins, Luanna Salk, MD  Vitamin D, Ergocalciferol, (DRISDOL) 1.25 MG (50000 UNIT) CAPS capsule Take 1 capsule (50,000 Units total) by mouth every 7 (seven) days. Patient taking differently: Take 50,000 Units by mouth every Friday. 08/10/19  Yes Dennard Nip D, MD  Continuous Blood Gluc Receiver (FREESTYLE LIBRE 2 READER) DEVI daily. as directed 04/13/19   [provider]  fluconazole (DIFLUCAN) 100 MG tablet Take 1 tablet (100 mg total) by mouth daily. Patient not taking: Reported on 08/13/2020 12/01/19   Landis Martins, DPM  omeprazole (PRILOSEC) 40 MG capsule Take 1 capsule by mouth once daily Patient not taking: Reported on 08/13/2020 02/09/19   Yetta Flock, MD    Allergies    Amlodipine benzoate, Contrast media [iodinated diagnostic agents], Iodine, Iohexol, Midazolam hcl, Other, Shellfish allergy, Shellfish-derived products, Valsartan, Metformin and related, Farxiga [dapagliflozin], Hydralazine, Saxagliptin, Spironolactone, Avandia [rosiglitazone maleate], Geodon [ziprasidone], Kiwi extract, and Latex  Review of Systems   Review of Systems  Constitutional:  Negative for chills and fever.  HENT:  Positive for facial swelling. Negative for drooling, ear pain, sore throat, trouble swallowing and voice change.   Eyes:  Negative for pain and visual disturbance.   Respiratory:  Negative for cough, shortness of breath and wheezing.   Cardiovascular:  Negative for chest pain and palpitations.  Gastrointestinal:  Negative for abdominal pain and vomiting.  Genitourinary:  Negative for dysuria and hematuria.  Musculoskeletal:  Negative for arthralgias and back pain.  Skin:  Positive for itching. Negative for color change and rash.  Neurological:  Negative for seizures and syncope.  All other systems reviewed and are negative.  Physical Exam Updated Vital Signs BP (!) 196/92   Pulse 79   Temp 98.8 F (37.1 C) (Oral)   Resp 17   Wt 110 kg   SpO2 98%   BMI 39.14 kg/m   Physical Exam Vitals and nursing note reviewed.  Constitutional:      General: She is not in acute distress.    Appearance: She is well-developed.  HENT:     Head: Normocephalic and atraumatic.     Mouth/Throat:     Comments: Diffuse swelling of the upper lip, mild tongue swelling, no clear swelling of the oropharynx, no tongue elevation, no drooling, stridor, muffled voice Eyes:     Conjunctiva/sclera: Conjunctivae normal.  Cardiovascular:     Rate and Rhythm: Normal rate and regular rhythm.     Heart sounds: No murmur heard. Pulmonary:     Effort: Pulmonary effort is normal. No respiratory distress.     Breath sounds: No wheezing or rhonchi.  Abdominal:     Palpations: Abdomen is soft.     Tenderness: There is no abdominal tenderness.  Musculoskeletal:     Cervical back: Neck supple.  Skin:    General: Skin is warm and dry.  Findings: No rash.  Neurological:     General: No focal deficit present.     Mental Status: She is alert. Mental status is at baseline.    ED Results / Procedures / Treatments   Labs (all labs ordered are listed, but only abnormal results are displayed) Labs Reviewed  HEMOGLOBIN A1C - Abnormal; Notable for the following components:      Result Value   Hgb A1c MFr Bld 8.4 (*)    All other components within normal limits  CBG  MONITORING, ED - Abnormal; Notable for the following components:   Glucose-Capillary 64 (*)    All other components within normal limits  CBG MONITORING, ED - Abnormal; Notable for the following components:   Glucose-Capillary 125 (*)    All other components within normal limits  CBG MONITORING, ED  CBG MONITORING, ED    EKG None  Radiology No results found.  Procedures .Critical Care  Date/Time: 08/13/2020 10:10 PM Performed by: Regan Lemming, MD Authorized by: Regan Lemming, MD   Critical care provider statement:    Critical care time (minutes):  39   Critical care was necessary to treat or prevent imminent or life-threatening deterioration of the following conditions: Angioedema.   Critical care was time spent personally by me on the following activities:  Blood draw for specimens, development of treatment plan with patient or surrogate, discussions with consultants, evaluation of patient's response to treatment, examination of patient, pulse oximetry, re-evaluation of patient's condition, review of old charts and obtaining history from patient or surrogate   Medications Ordered in ED Medications  sodium chloride flush (NS) 0.9 % injection 3 mL (3 mLs Intravenous Given 08/13/20 1656)  sodium chloride flush (NS) 0.9 % injection 3 mL (3 mLs Intravenous Given 08/13/20 1655)  0.9 %  sodium chloride infusion (has no administration in time range)  allopurinol (ZYLOPRIM) tablet 100 mg (100 mg Oral Given 08/13/20 2027)  aspirin EC tablet 81 mg (has no administration in time range)  traMADol (ULTRAM) tablet 50 mg (has no administration in time range)  atorvastatin (LIPITOR) tablet 10 mg (has no administration in time range)  carvedilol (COREG) tablet 25 mg (has no administration in time range)  cloNIDine (CATAPRES) tablet 0.2 mg (has no administration in time range)  DULoxetine (CYMBALTA) DR capsule 20 mg (has no administration in time range)  pantoprazole (PROTONIX) EC tablet 40 mg (has  no administration in time range)  gabapentin (NEURONTIN) capsule 600 mg (has no administration in time range)  tiZANidine (ZANAFLEX) tablet 2 mg (has no administration in time range)  famotidine (PEPCID) IVPB 20 mg premix (has no administration in time range)  labetalol (NORMODYNE) injection 10 mg (has no administration in time range)  insulin aspart (novoLOG) injection 0-9 Units (1 Units Subcutaneous Given 08/13/20 2027)  insulin aspart (novoLOG) injection 0-5 Units (has no administration in time range)  enoxaparin (LOVENOX) injection 50 mg (has no administration in time range)  methylPREDNISolone sodium succinate (SOLU-MEDROL) 40 mg/mL injection 40 mg (has no administration in time range)  furosemide (LASIX) tablet 40 mg (has no administration in time range)  DULoxetine (CYMBALTA) DR capsule 40 mg (has no administration in time range)  insulin glargine-yfgn (SEMGLEE) injection 10 Units (has no administration in time range)  famotidine (PEPCID) IVPB 20 mg premix (0 mg Intravenous Stopped 08/13/20 1610)  methylPREDNISolone sodium succinate (SOLU-MEDROL) 125 mg/2 mL injection 125 mg (125 mg Intravenous Given 08/13/20 1521)  labetalol (NORMODYNE) injection 20 mg (20 mg Intravenous Given 08/13/20 1539)  dextrose 10 % infusion (0 mLs Intravenous Stopped 08/13/20 1800)    ED Course  I have reviewed the triage vital signs and the nursing notes.  Pertinent labs & imaging results that were available during my care of the patient were reviewed by me and considered in my medical decision making (see chart for details).    MDM Rules/Calculators/A&P                           50 year old female with past medical history of angioedema to multiple medications presenting with concerning findings for angioedema.  The patient was status post IM epinephrine via EpiPen injector, 50 mg of IV Benadryl.  On arrival, the patient had left-sided lip swelling that subsequently began to progress to diffuse lip swelling.  She  maintained her secretions and was protecting her airway.  She was administered IV Solu-Medrol and IV Pepcid in the emergency department.  ENT was consulted due to progression of likely angioedema and need for laryngoscopy.  Hospitalist medicine was consulted for admission for observation.  Final Clinical Impression(s) / ED Diagnoses Final diagnoses:  Angioedema, initial encounter    Rx / DC Orders ED Discharge Orders     None        Regan Lemming, MD 08/13/20 2213

## 2020-08-14 ENCOUNTER — Other Ambulatory Visit: Payer: Self-pay

## 2020-08-14 ENCOUNTER — Encounter (HOSPITAL_COMMUNITY): Payer: Self-pay | Admitting: Family Medicine

## 2020-08-14 DIAGNOSIS — T783XXD Angioneurotic edema, subsequent encounter: Secondary | ICD-10-CM

## 2020-08-14 DIAGNOSIS — T783XXA Angioneurotic edema, initial encounter: Secondary | ICD-10-CM | POA: Diagnosis not present

## 2020-08-14 LAB — CBG MONITORING, ED: Glucose-Capillary: 387 mg/dL — ABNORMAL HIGH (ref 70–99)

## 2020-08-14 LAB — GLUCOSE, CAPILLARY
Glucose-Capillary: 446 mg/dL — ABNORMAL HIGH (ref 70–99)
Glucose-Capillary: 468 mg/dL — ABNORMAL HIGH (ref 70–99)

## 2020-08-14 LAB — SARS CORONAVIRUS 2 (TAT 6-24 HRS): SARS Coronavirus 2: NEGATIVE

## 2020-08-14 MED ORDER — HYDRALAZINE HCL 20 MG/ML IJ SOLN: 10.0000 mg | Freq: Four times a day (QID) | INTRAMUSCULAR | Status: DC | PRN

## 2020-08-14 MED ORDER — INSULIN GLARGINE-YFGN 100 UNIT/ML ~~LOC~~ SOLN
25.0000 [IU] | Freq: Every day | SUBCUTANEOUS | Status: DC
  Filled 2020-08-14: qty 0.25

## 2020-08-14 MED ORDER — INSULIN ASPART 100 UNIT/ML IJ SOLN: 6.0000 [IU] | Freq: Three times a day (TID) | INTRAMUSCULAR | Status: DC

## 2020-08-14 MED ORDER — PREDNISONE 20 MG PO TABS: 20.0000 mg | ORAL_TABLET | Freq: Every day | ORAL | 0 refills | Status: DC

## 2020-08-14 MED ORDER — DIPHENHYDRAMINE HCL 25 MG PO TABS: 25.0000 mg | ORAL_TABLET | Freq: Three times a day (TID) | ORAL | Status: DC | PRN

## 2020-08-14 MED ORDER — INSULIN GLARGINE-YFGN 100 UNIT/ML ~~LOC~~ SOLN
20.0000 [IU] | Freq: Every day | SUBCUTANEOUS | Status: DC
  Filled 2020-08-14: qty 0.2

## 2020-08-14 MED ORDER — INSULIN ASPART 100 UNIT/ML IJ SOLN
8.0000 [IU] | Freq: Three times a day (TID) | INTRAMUSCULAR | Status: DC
  Administered 2020-08-14: 8 [IU] via SUBCUTANEOUS

## 2020-08-14 MED ORDER — INSULIN ASPART 100 UNIT/ML IJ SOLN
6.0000 [IU] | Freq: Once | INTRAMUSCULAR | Status: AC
  Administered 2020-08-14: 6 [IU] via SUBCUTANEOUS

## 2020-08-14 MED ORDER — CLONIDINE HCL 0.2 MG PO TABS
0.3000 mg | ORAL_TABLET | Freq: Two times a day (BID) | ORAL | Status: DC
  Administered 2020-08-14 – 2020-08-15 (×2): 0.3 mg via ORAL
  Filled 2020-08-14 (×2): qty 1

## 2020-08-14 MED ORDER — LABETALOL HCL 5 MG/ML IV SOLN: 10.0000 mg | INTRAVENOUS | Status: DC | PRN

## 2020-08-14 NOTE — Progress Notes (Signed)
PROGRESS NOTE    Tina [name]  [id number] DOB: Sep 04, 1970 DOA: 08/13/2020 PCP: [name], DO  Brief Narrative: Pleasant 50 year old female with long history of angioedema to many medicines, type 2 diabetes mellitus, peripheral neuropathy, uncontrolled hypertension presented to the ED with swelling of her lips and left side of her face when she woke up on 8/1, this is her second reaction in 2 weeks, denies any recent antibiotics, last medication change was isosorbide which was started a month ago. In the ED she was noted to be profoundly hypertensive, blood pressure was 223 x 105, treated with labetalol, also treated with steroids and Benadryl for angioedema   Assessment & Plan:  Angioedema -History of allergies and angioedema type reactions to multiple medications, nuts etc -Swelling is improving, appreciate ENT eval -Received a dose of IV steroids this morning, transition to prednisone from tomorrow for 2 more days, continue Pepcid -Advised follow-up with allergy/immunology MD Dr. Donneta Romberg  Uncontrolled hypertension -History of numerous allergies which limit medication options -Continue carvedilol, clonidine-Will increase dose -Discontinue isosorbide -As needed labetalol  Severe hyperglycemia Type 2 diabetes mellitus -CBGs worse in the setting of steroids, continue Lantus, on 18 units nightly will increase dose -Add NovoLog with meals, sliding scale insulin  DVT prophylaxis:lovenox Code Status: Full code Family Communication: Discussed patient in detail, no family at bedside Disposition Plan:  Status is: Observation  The patient remains OBS appropriate and will d/c before 2 midnights.  Dispo: The patient is from: Home              Anticipated d/c is to: Home when CBGs and blood pressure is more stable              Patient currently is not medically stable to d/c.   Difficult to place patient No        Consultants:  ENT  Procedures:   Antimicrobials:     Subjective: -Feels better, left facial and tongue swelling is improving  Objective: Vitals:   08/14/20 0715 08/14/20 0730 08/14/20 0745 08/14/20 0905  BP: (!) 165/74 (!) 160/71 (!) 116/98 (!) 186/84  Pulse: 82 89 90 82  Resp: (!) 24 (!) 34  16  Temp:    99.7 F (37.6 C)  TempSrc:    Oral  SpO2: 96% 98% 99% 100%  Weight:        Intake/Output Summary (Last 24 hours) at 08/14/2020 1608 Last data filed at 08/14/2020 1200 Gross per 24 hour  Intake 473.5 ml  Output --  Net 473.5 ml   Filed Weights   08/13/20 1600  Weight: 110 kg    Examination:  General exam: Pleasant female sitting up in bed, AAOx3 HEENT: Mild lip swelling, mild left facial swelling CVS: S1-S2, regular rate rhythm Lungs: No stridor, clear Abdomen: Soft, nontender, bowel sounds present Extremities: No edema   Data Reviewed:   CBC: No results for input(s): WBC, NEUTROABS, HGB, HCT, MCV, PLT in the last 168 hours. Basic Metabolic Panel: No results for input(s): NA, K, CL, CO2, GLUCOSE, BUN, CREATININE, CALCIUM, MG, PHOS in the last 168 hours. GFR: CrCl cannot be calculated (Patient's most recent lab result is older than the maximum 21 days allowed.). Liver Function Tests: No results for input(s): AST, ALT, ALKPHOS, BILITOT, PROT, ALBUMIN in the last 168 hours. No results for input(s): LIPASE, AMYLASE in the last 168 hours. No results for input(s): AMMONIA in the last 168 hours. Coagulation Profile: No results for input(s): INR, PROTIME in the last 168 hours.  Cardiac Enzymes: No results for input(s): CKTOTAL, CKMB, CKMBINDEX, TROPONINI in the last 168 hours. BNP (last 3 results) No results for input(s): PROBNP in the last 8760 hours. HbA1C: Recent Labs    08/13/20 1644  HGBA1C 8.4*   CBG: Recent Labs  Lab 08/13/20 1516 08/13/20 1603 08/13/20 1706 08/14/20 0729 08/14/20 1149  GLUCAP 64* 86 125* 387* 446*   Lipid Profile: No results for input(s): CHOL, HDL, LDLCALC, TRIG, CHOLHDL,  LDLDIRECT in the last 72 hours. Thyroid Function Tests: No results for input(s): TSH, T4TOTAL, FREET4, T3FREE, THYROIDAB in the last 72 hours. Anemia Panel: No results for input(s): VITAMINB12, FOLATE, FERRITIN, TIBC, IRON, RETICCTPCT in the last 72 hours. Urine analysis:    Component Value Date/Time   COLORURINE YELLOW 04/20/2018 1537   APPEARANCEUR CLEAR 04/20/2018 1537   LABSPEC 1.013 04/20/2018 1537   PHURINE 5.0 04/20/2018 1537   GLUCOSEU >=500 (A) 04/20/2018 1537   HGBUR LARGE (A) 04/20/2018 1537   BILIRUBINUR NEGATIVE 04/20/2018 1537   KETONESUR NEGATIVE 04/20/2018 1537   PROTEINUR 100 (A) 04/20/2018 1537   UROBILINOGEN 0.2 08/18/2014 1423   NITRITE NEGATIVE 04/20/2018 1537   LEUKOCYTESUR NEGATIVE 04/20/2018 1537   Sepsis Labs: '@LABRCNTIP'$ (procalcitonin:4,lacticidven:4)  ) Recent Results (from the past 240 hour(s))  SARS CORONAVIRUS 2 (TAT 6-24 HRS) Nasopharyngeal Nasopharyngeal Swab     Status: None   Collection Time: 08/14/20  4:05 AM   Specimen: Nasopharyngeal Swab  Result Value Ref Range Status   SARS Coronavirus 2 NEGATIVE NEGATIVE Final    Comment: (NOTE) SARS-CoV-2 target nucleic acids are NOT DETECTED.  The SARS-CoV-2 RNA is generally detectable in upper and lower respiratory specimens during the acute phase of infection. Negative results do not preclude SARS-CoV-2 infection, do not rule out co-infections with other pathogens, and should not be used as the sole basis for treatment or other patient management decisions. Negative results must be combined with clinical observations, patient history, and epidemiological information. The expected result is Negative.  Fact Sheet for Patients: SugarRoll.be  Fact Sheet for Healthcare Providers: https://www.woods-mathews.com/  This test is not yet approved or cleared by the [place] FDA and  has been authorized for detection and/or diagnosis of SARS-CoV-2 by FDA  under an Emergency Use Authorization (EUA). This EUA will remain  in effect (meaning this test can be used) for the duration of the COVID-19 declaration under Se ction 564(b)(1) of the Act, 21 U.S.C. section 360bbb-3(b)(1), unless the authorization is terminated or revoked sooner.  Performed at [facility], [place] [address], [place], [place] [address]          Radiology Studies: No results found.      Scheduled Meds:  allopurinol  100 mg Oral QPM   aspirin EC  81 mg Oral Daily   atorvastatin  10 mg Oral Daily   carvedilol  25 mg Oral BID   cloNIDine  0.3 mg Oral BID   DULoxetine  20 mg Oral q AM   DULoxetine  40 mg Oral QHS   enoxaparin (LOVENOX) injection  50 mg Subcutaneous Q24H   furosemide  40 mg Oral Daily   gabapentin  600 mg Oral TID   insulin aspart  0-5 Units Subcutaneous QHS   insulin aspart  0-9 Units Subcutaneous TID WC   insulin aspart  6 Units Subcutaneous TID WC   insulin glargine-yfgn  20 Units Subcutaneous QHS   pantoprazole  40 mg Oral Daily   sodium chloride flush  3 mL Intravenous Q12H  traMADol  50 mg Oral BID   Continuous Infusions:  sodium chloride     famotidine (PEPCID) IV Stopped (08/14/20 1028)     LOS: 0 days    Time spent: 65mn    PDomenic Polite MD [facility]   08/14/2020, 4:08 PM

## 2020-08-14 NOTE — Plan of Care (Signed)
New careplan started

## 2020-08-14 NOTE — ED Notes (Signed)
Pharmacy notified for cymbalta dose

## 2020-08-15 DIAGNOSIS — T783XXA Angioneurotic edema, initial encounter: Secondary | ICD-10-CM | POA: Diagnosis not present

## 2020-08-15 DIAGNOSIS — T783XXD Angioneurotic edema, subsequent encounter: Secondary | ICD-10-CM | POA: Diagnosis not present

## 2020-08-15 LAB — CBC
HCT: 36.3 % (ref 36.0–46.0)
Hemoglobin: 11.8 g/dL — ABNORMAL LOW (ref 12.0–15.0)
MCH: 27.5 pg (ref 26.0–34.0)
MCHC: 32.5 g/dL (ref 30.0–36.0)
MCV: 84.6 fL (ref 80.0–100.0)
Platelets: 323 10*3/uL (ref 150–400)
RBC: 4.29 MIL/uL (ref 3.87–5.11)
RDW: 13.7 % (ref 11.5–15.5)
WBC: 11.3 10*3/uL — ABNORMAL HIGH (ref 4.0–10.5)
nRBC: 0 % (ref 0.0–0.2)

## 2020-08-15 LAB — GLUCOSE, CAPILLARY: Glucose-Capillary: 502 mg/dL (ref 70–99)

## 2020-08-15 LAB — BASIC METABOLIC PANEL
Anion gap: 11 (ref 5–15)
BUN: 30 mg/dL — ABNORMAL HIGH (ref 6–20)
CO2: 21 mmol/L — ABNORMAL LOW (ref 22–32)
Calcium: 8.3 mg/dL — ABNORMAL LOW (ref 8.9–10.3)
Chloride: 104 mmol/L (ref 98–111)
Creatinine, Ser: 1.29 mg/dL — ABNORMAL HIGH (ref 0.44–1.00)
GFR, Estimated: 50 mL/min — ABNORMAL LOW (ref >60)
Glucose, Bld: 375 mg/dL — ABNORMAL HIGH (ref 70–99)
Potassium: 3.9 mmol/L (ref 3.5–5.1)
Sodium: 136 mmol/L (ref 135–145)

## 2020-08-15 MED ORDER — BASAGLAR KWIKPEN 100 UNIT/ML ~~LOC~~ SOPN: 22.0000 [IU] | PEN_INJECTOR | Freq: Every day | SUBCUTANEOUS | Status: DC

## 2020-08-15 MED ORDER — INSULIN ASPART 100 UNIT/ML IJ SOLN
4.0000 [IU] | Freq: Three times a day (TID) | INTRAMUSCULAR | Status: DC
  Administered 2020-08-15: 4 [IU] via SUBCUTANEOUS

## 2020-08-15 NOTE — Progress Notes (Signed)
CBG done with pt's own CGM, MD at bedside and verifies results. CGM results 190

## 2020-08-15 NOTE — Progress Notes (Signed)
Reviewed d/c instructions with patient, aware of follow up appointment with allergy doctor. IV removed, pt ambulatory, and independent. No distress noted.

## 2020-08-16 ENCOUNTER — Encounter: Payer: Self-pay | Admitting: Family Medicine

## 2020-08-16 ENCOUNTER — Ambulatory Visit (INDEPENDENT_AMBULATORY_CARE_PROVIDER_SITE_OTHER): Payer: Federal, State, Local not specified - PPO | Admitting: Family Medicine

## 2020-08-16 ENCOUNTER — Other Ambulatory Visit: Payer: Self-pay

## 2020-08-16 VITALS — BP 160/88 | HR 76 | Temp 98.2°F | Resp 16 | Ht 66.0 in | Wt 258.6 lb

## 2020-08-16 DIAGNOSIS — J302 Other seasonal allergic rhinitis: Secondary | ICD-10-CM | POA: Diagnosis not present

## 2020-08-16 DIAGNOSIS — T781XXD Other adverse food reactions, not elsewhere classified, subsequent encounter: Secondary | ICD-10-CM

## 2020-08-16 DIAGNOSIS — Z91018 Allergy to other foods: Secondary | ICD-10-CM | POA: Diagnosis not present

## 2020-08-16 DIAGNOSIS — T783XXD Angioneurotic edema, subsequent encounter: Secondary | ICD-10-CM

## 2020-08-16 DIAGNOSIS — L299 Pruritus, unspecified: Secondary | ICD-10-CM

## 2020-08-16 DIAGNOSIS — T7800XA Anaphylactic reaction due to unspecified food, initial encounter: Secondary | ICD-10-CM

## 2020-08-16 DIAGNOSIS — R0602 Shortness of breath: Secondary | ICD-10-CM | POA: Diagnosis not present

## 2020-08-16 DIAGNOSIS — J3089 Other allergic rhinitis: Secondary | ICD-10-CM | POA: Diagnosis not present

## 2020-08-16 MED ORDER — EPINEPHRINE 0.3 MG/0.3ML IJ SOAJ: 0.3000 mg | INTRAMUSCULAR | 2 refills | Status: DC | PRN

## 2020-08-16 MED ORDER — FAMOTIDINE 20 MG PO TABS: 20.0000 mg | ORAL_TABLET | Freq: Two times a day (BID) | ORAL | 5 refills | Status: DC

## 2020-08-16 MED ORDER — ALBUTEROL SULFATE HFA 108 (90 BASE) MCG/ACT IN AERS: 2.0000 | INHALATION_SPRAY | RESPIRATORY_TRACT | 1 refills | Status: DC | PRN

## 2020-08-16 MED ORDER — CETIRIZINE HCL 10 MG PO TABS: ORAL_TABLET | ORAL | 5 refills | Status: DC

## 2020-08-16 MED ORDER — EPINEPHRINE 0.3 MG/0.3ML IJ SOAJ: 0.3000 mg | INTRAMUSCULAR | 1 refills | Status: AC | PRN

## 2020-08-16 NOTE — Patient Instructions (Addendum)
Angioedema We have ordered some labs to help [name] determine the cause of your swelling.  We will call you with the results when they become available If your symptoms re-occur, begin a journal of events that occurred for up to 6 hours before your symptoms began including foods and beverages consumed, soaps or perfumes you had contact with, and medications.  In case of an allergic reaction, take Benadryl 50 mg every 4 hours, and if life-threatening symptoms occur, inject with AuviQ 0.3 mg.  Pruritus Use the least amount of medications while remaining itch free Cetirizine (Zyrtec) '10mg'$  twice a day and famotidine (Pepcid) 20 mg twice a day. If no symptoms for 7-14 days then decrease to. Cetirizine (Zyrtec) '10mg'$  twice a day and famotidine (Pepcid) 20 mg once a day.  If no symptoms for 7-14 days then decrease to. Cetirizine (Zyrtec) '10mg'$  twice a day.  If no symptoms for 7-14 days then decrease to. Cetirizine (Zyrtec) '10mg'$  once a day.  May use Benadryl (diphenhydramine) as needed for breakthrough hives       If symptoms return, then step up dosage  Alpha gal Continue to avoid mammalian products.  In case of an allergic reaction, take Benadryl 50 mg every 4 hours, and if life-threatening symptoms occur, inject with AuviQ 0.3 mg.  Shortness of breath Begin albuterol 2 puffs once every 4 hours as needed for cough or wheeze You may use albuterol 2 puffs 5 to 15 minutes before activity to decrease cough or wheeze  Food allergy Continue to avoid tree nuts, shellfish, kiwi, and avocado. In case of an allergic reaction, take Benadryl 50 mg every 4 hours, and if life-threatening symptoms occur, inject with AuviQ 0.3 mg.  Oral allergy syndrome Continue to avoid foods that bother your mouth  Allergic rhinitis Continue allergen avoidance measures directed toward grass pollen, weed pollen, tree pollen, dust mites, and cat as listed below Continue cetirizine as needed for a runny nose or itch Use Flonase 2  sprays in each nostril once a day as needed for a stuffy nose Consider saline nasal rinses as needed for nasal symptoms. Use this before any medicated nasal sprays for best result  Call the clinic if this treatment plan is not working well for you  Follow up in 2 weeks or sooner if needed.

## 2020-08-16 NOTE — Progress Notes (Signed)
Tina [name] [id number] Dept: [phone]  FOLLOW UP NOTE  Patient ID: Tina [name], female    DOB: [birth year]  Age: 50 y.o. MRN: [id number] Date of Office Visit: 08/16/2020  Assessment  Chief Complaint: Angioedema (Swelling on her face lips, cheeks - itching tongue and down her throat to her chest. Was in the hospital Monday and just got out yesterday. She was going to the bank and then started to have swelling she did not eat anything. An epi pen was used that Monday. ) and Pruritus  HPI Tina [name] is a 50 year old female who presents to the clinic for evaluation of acute angioedema.  She was last seen in this clinic on 10/13/2017 by Dr. Neldon Mc for evaluation of angioedema, alpha gal allergy, and food allergy to tree nuts.  In the interim, she was most recently admitted to the emergency department on 08/13/2020 for treatment for angioedema and on 06/26/2020 for angioedema. She reports that her initial bout of angioedema occurred in January 2019 and coincided with a change of her blood pressure medications. Since that time she has had numerous changes to her medications as well as many episodes of angioedema. She began taking isosorbide in June 2022. She reports that most of the episodes of swelling involve her lips and face and take several days to resolve. She has required steroids several times for resolution of swelling. She denies cardiopulmonary and gastrointestinal symptoms with the swelling episodes. She denies any throat swelling. She had a negative C4 screening lab in 2019. She denies family history of angioedema. She does have a history of positive alpha gal allergy panel which was last evaluated in 2019. She reports occasionally eating red meat. She denies the swelling episodes and red meat ingestion happen on the same days. She last had lab work to evaluate food allergies on 10/13/2017 which were positive to tree nuts. She continues to avoid tree nuts with no  accidental ingestion or EpiPen use since her last visit to the clinic. She is avoiding foods that make her throat itch including kiwi and avocado. She also reports that she is avoiding shellfish due to an anaphylactic reaction. She reports that she has been experiencing pruritus daily for the last year which occurs all over her body and is the worst in her hands and on her forearms. She is currently taking cetirizine and Benadryl as needed with minimal relief of symptoms. She denies hives. Her last BMP on 08/15/2020 indicates BUN increased at 30, creatinine elevated to 1.29, and GFR 50. CBC on the same day indicates WBC 11.3 and Hgb 11.8. Chest xray from 05/15/2020 is within normal limits with no cardiopulmonary disease. She occasionally takes famotidine for reflux with relief of symptoms. Allergic rhinitis is reported as moderately well controlled occasional cetirizine as needed. She does report that she has been experiencing shortness of breath and dry cough about once a week for the last several months. She has used albuterol in the past, however, has not used this medication recently. Her current medications are listed in the chart.   Drug Allergies:  Allergies  Allergen Reactions   Amlodipine Benzoate Swelling and Other (See Comments)    Leg swelling   Contrast Media [Iodinated Diagnostic Agents] Shortness Of Breath and Other (See Comments)    Difficulty breathing   Iodine Anaphylaxis   Iohexol Hives, Nausea And Vomiting, Swelling and Other (See Comments)     Desc: Magnevist-gadolinium-difficulty breathing, throat swelling    Midazolam Hcl Anaphylaxis  Difficulty breathing   Other Shortness Of Breath, Itching and Other (See Comments)    Patient is allergic to all nuts except peanuts, which are NOT "nuts"- they are legumes   Shellfish Allergy Anaphylaxis   Shellfish-Derived Products Anaphylaxis   Valsartan Swelling   Metformin And Related Diarrhea, Nausea And Vomiting and Other (See Comments)     Dehydration, also   [place] [Dapagliflozin] Nausea Only and Other (See Comments)    UTI, headaches and muscle aches, also   Hydralazine Other (See Comments)    Reaction not recalled   Saxagliptin Hives, Nausea And Vomiting and Other (See Comments)    ONGLYZA- dehydration, also   Spironolactone Other (See Comments)    Elevated potassium- Severe hyperkalemia   Avandia [Rosiglitazone Maleate] Hives and Other (See Comments)   Geodon [Ziprasidone] Other (See Comments)    Reaction not recalled   Kiwi Extract Itching and Swelling   Latex Itching    Physical Exam: BP (!) 160/88   Pulse 76   Temp 98.2 F (36.8 C)   Resp 16   Ht '5\' 6"'$  (1.676 m)   Wt 258 lb 9.6 oz (117.3 kg)   SpO2 98%   BMI 41.74 kg/m    Physical Exam Vitals reviewed.  Constitutional:      Appearance: Normal appearance.  HENT:     Head: Normocephalic and atraumatic.     Right Ear: Tympanic membrane normal.     Left Ear: Tympanic membrane normal.     Nose:     Comments: Bilateral nares slightly erythematous and pale with clear nasal drainage noted. Pharynx normal. Ears normal. Eyes normal.    Mouth/Throat:     Pharynx: Oropharynx is clear.  Eyes:     Conjunctiva/sclera: Conjunctivae normal.  Cardiovascular:     Rate and Rhythm: Normal rate and regular rhythm.     Heart sounds: Normal heart sounds. No murmur heard. Pulmonary:     Effort: Pulmonary effort is normal.     Breath sounds: Normal breath sounds.     Comments: Lungs clear to auscultation Musculoskeletal:        General: Normal range of motion.     Cervical back: Normal range of motion.  Skin:    General: Skin is warm and dry.     Comments: Some left sided facial swelling noted. No lip swelling at today's visit, however, pictures on her phone show several incidences of large lip swelling occurring on different sections of her lips on different days.   Neurological:     Mental Status: She is alert and oriented to person, place, and time.   Psychiatric:        Mood and Affect: Mood normal.        Behavior: Behavior normal.        Thought Content: Thought content normal.        Judgment: Judgment normal.    Diagnostics: FVC 2.36, FEV1 1.95.  Predicted FVC 3.12, predicted FEV1 2.50.  Spirometry indicates mild restriction.  Postbronchodilator FVC 2.28, FEV1 2.10.  Postbronchodilator indicates no significant bronchodilator response.  Assessment and Plan: 1. Angioedema, subsequent encounter   2. Shortness of breath   3. Allergy to alpha-gal   4. Seasonal and perennial allergic rhinitis   5. Pruritus   6. Allergy with anaphylaxis due to food   7. Pollen-food allergy, subsequent encounter     Meds ordered this encounter  Medications   DISCONTD: EPINEPHrine 0.3 mg/0.3 mL IJ SOAJ injection    Sig: Inject 0.3  mg into the muscle as needed for anaphylaxis.    Dispense:  1 each    Refill:  2    Please dispense generic brand teva or mylan   cetirizine (ZYRTEC) 10 MG tablet    Sig: Take 1 tablet by mouth 1-2 times a day as directed for itching    Dispense:  60 tablet    Refill:  5   famotidine (PEPCID) 20 MG tablet    Sig: Take 1 tablet (20 mg total) by mouth 2 (two) times daily.    Dispense:  60 tablet    Refill:  5   albuterol (VENTOLIN HFA) 108 (90 Base) MCG/ACT inhaler    Sig: Inhale 2 puffs into the lungs every 4 (four) hours as needed for wheezing or shortness of breath.    Dispense:  18 g    Refill:  1   EPINEPHrine (AUVI-Q) 0.3 mg/0.3 mL IJ SOAJ injection    Sig: Inject 0.3 mg into the muscle as needed for anaphylaxis.    Dispense:  1 each    Refill:  1    Patient Instructions  Angioedema We have ordered some labs to help [name] determine the cause of your swelling.  We will call you with the results when they become available If your symptoms re-occur, begin a journal of events that occurred for up to 6 hours before your symptoms began including foods and beverages consumed, soaps or perfumes you had contact  with, and medications.  In case of an allergic reaction, take Benadryl 50 mg every 4 hours, and if life-threatening symptoms occur, inject with AuviQ 0.3 mg.  Pruritus Use the least amount of medications while remaining itch free Cetirizine (Zyrtec) '10mg'$  twice a day and famotidine (Pepcid) 20 mg twice a day. If no symptoms for 7-14 days then decrease to. Cetirizine (Zyrtec) '10mg'$  twice a day and famotidine (Pepcid) 20 mg once a day.  If no symptoms for 7-14 days then decrease to. Cetirizine (Zyrtec) '10mg'$  twice a day.  If no symptoms for 7-14 days then decrease to. Cetirizine (Zyrtec) '10mg'$  once a day.  May use Benadryl (diphenhydramine) as needed for breakthrough hives       If symptoms return, then step up dosage  Alpha gal Continue to avoid mammalian products.  In case of an allergic reaction, take Benadryl 50 mg every 4 hours, and if life-threatening symptoms occur, inject with AuviQ 0.3 mg.  Shortness of breath Begin albuterol 2 puffs once every 4 hours as needed for cough or wheeze You may use albuterol 2 puffs 5 to 15 minutes before activity to decrease cough or wheeze  Food allergy Continue to avoid tree nuts, shellfish, kiwi, and avocado. In case of an allergic reaction, take Benadryl 50 mg every 4 hours, and if life-threatening symptoms occur, inject with AuviQ 0.3 mg.  Oral allergy syndrome Continue to avoid foods that bother your mouth  Allergic rhinitis Continue allergen avoidance measures directed toward grass pollen, weed pollen, tree pollen, dust mites, and cat as listed below Continue cetirizine as needed for a runny nose or itch Use Flonase 2 sprays in each nostril once a day as needed for a stuffy nose Consider saline nasal rinses as needed for nasal symptoms. Use this before any medicated nasal sprays for best result  Call the clinic if this treatment plan is not working well for you  Follow up in 2 weeks or sooner if needed.  Return in about 2 weeks (around  08/30/2020), or if symptoms worsen  or fail to improve.    Thank you for the opportunity to care for this patient.  Please do not hesitate to contact me with questions.  Gareth Morgan, FNP [facility]

## 2020-08-18 ENCOUNTER — Encounter: Payer: Self-pay | Admitting: Family Medicine

## 2020-08-18 DIAGNOSIS — T781XXA Other adverse food reactions, not elsewhere classified, initial encounter: Secondary | ICD-10-CM | POA: Insufficient documentation

## 2020-08-18 DIAGNOSIS — J3089 Other allergic rhinitis: Secondary | ICD-10-CM | POA: Insufficient documentation

## 2020-08-18 DIAGNOSIS — Z91018 Allergy to other foods: Secondary | ICD-10-CM | POA: Insufficient documentation

## 2020-08-18 DIAGNOSIS — J302 Other seasonal allergic rhinitis: Secondary | ICD-10-CM | POA: Insufficient documentation

## 2020-08-18 DIAGNOSIS — R0602 Shortness of breath: Secondary | ICD-10-CM | POA: Insufficient documentation

## 2020-08-18 DIAGNOSIS — L299 Pruritus, unspecified: Secondary | ICD-10-CM | POA: Insufficient documentation

## 2020-08-18 DIAGNOSIS — T7800XA Anaphylactic reaction due to unspecified food, initial encounter: Secondary | ICD-10-CM | POA: Insufficient documentation

## 2020-08-20 LAB — ALPHA-GAL PANEL
Allergen Lamb IgE: 0.19 kU/L — AB
Beef IgE: 0.26 kU/L — AB
IgE (Immunoglobulin E), Serum: 2746 IU/mL — ABNORMAL HIGH (ref 6–495)
O215-IgE Alpha-Gal: 0.1 kU/L — AB
Pork IgE: 0.3 kU/L — AB

## 2020-08-21 NOTE — Discharge Summary (Signed)
Physician Discharge Summary  Tina [name] [id number] DOB: 08-28-1970 DOA: 08/13/2020  PCP: Tina Morning, DO  Admit date: 08/13/2020 Discharge date: 08/15/2020  Time spent: 36mnutes  Recommendations for Outpatient Follow-up:  PCP in 1 week Allergy immunology MD in 2 to 3 weeks   Discharge Diagnoses:  Active Problems:   Allergic reaction   Angioedema Morbid obesity Type 2 diabetes mellitus Uncontrolled hypertension  Discharge Condition: Stable  Diet recommendation: Carb modified low-sodium  Filed Weights   08/13/20 1600 08/14/20 2121  Weight: 110 kg 110 kg    History of present illness:  Pleasant 50year old female with long history of angioedema to many medicines, type 2 diabetes mellitus, peripheral neuropathy, uncontrolled hypertension presented to the ED with swelling of her lips and left side of her face when she woke up on 8/1, this is her second reaction in 2 weeks, denies any recent antibiotics, last medication change was isosorbide which was started a month ago. In the ED she was noted to be profoundly hypertensive, blood pressure was 223 x 105, treated with labetalol, also treated with steroids and Benadryl for angioedema  Hospital Course:   Angioedema -History of allergies and angioedema type reactions to multiple medications, nuts etc -Swelling is improving, appreciate ENT eval -Etiology is unclear, seen in the ED over a week ago for same, apparently started Imdur 1 month ago, we asked her to hold off on this -Improved with IV steroids, Pepcid, supportive care, did not prescribe prednisone at discharge due to severe hyperglycemia from steroids  -Advised follow-up with allergy/immunology MD Dr. SDonneta Romberg  Uncontrolled hypertension -History of numerous allergies which limit medication options -Continue carvedilol, clonidine-dose increased -Discontinued isosorbide mononitrate due to history of to allergic reactions after starting Imdur a month ago -Follow-up  with PCP and cardiology   Severe hyperglycemia Type 2 diabetes mellitus -CBGs worse in the setting of steroids, insulin dose increased in the setting of steroids, advised weight loss and lifestyle modification    Discharge Exam: Vitals:   08/14/20 2339 08/15/20 0348  BP: (!) 153/66 (!) 158/70  Pulse: 71 67  Resp: 18 18  Temp: 98.2 F (36.8 C) 97.7 F (36.5 C)  SpO2: 97% 97%    General: AAOx3, no distress Cardiovascular: S1-S2, regular rate rhythm Respiratory: Clear  Discharge Instructions   Discharge Instructions     Ambulatory referral to Allergy   Complete by: As directed    Recurrent angioedema   Diet - low sodium heart healthy   Complete by: As directed    Diet Carb Modified   Complete by: As directed    Increase activity slowly   Complete by: As directed       Allergies as of 08/15/2020       Reactions   Amlodipine Benzoate Swelling, Other (See Comments)   Leg swelling   Contrast Media [iodinated Diagnostic Agents] Shortness Of Breath, Other (See Comments)   Difficulty breathing   Iodine Anaphylaxis   Iohexol Hives, Nausea And Vomiting, Swelling, Other (See Comments)    Desc: Magnevist-gadolinium-difficulty breathing, throat swelling   Midazolam Hcl Anaphylaxis   Difficulty breathing   Other Shortness Of Breath, Itching, Other (See Comments)   Patient is allergic to all nuts except peanuts, which are NOT "nuts"- they are legumes   Shellfish Allergy Anaphylaxis   Shellfish-derived Products Anaphylaxis   Valsartan Swelling   Metformin And Related Diarrhea, Nausea And Vomiting, Other (See Comments)   Dehydration, also   Farxiga [dapagliflozin] Nausea Only, Other (See Comments)  UTI, headaches and muscle aches, also   Hydralazine Other (See Comments)   Reaction not recalled   Saxagliptin Hives, Nausea And Vomiting, Other (See Comments)   ONGLYZA- dehydration, also   Spironolactone Other (See Comments)   Elevated potassium- Severe hyperkalemia    Avandia [rosiglitazone Maleate] Hives, Other (See Comments)   Geodon [ziprasidone] Other (See Comments)   Reaction not recalled   Kiwi Extract Itching, Swelling   Latex Itching        Medication List     STOP taking these medications    fluconazole 100 MG tablet Commonly known as: Diflucan   isosorbide mononitrate 60 MG 24 hr tablet Commonly known as: IMDUR       TAKE these medications    allopurinol 100 MG tablet Commonly known as: ZYLOPRIM Take 100 mg by mouth at bedtime.   aspirin EC 81 MG tablet Take 81 mg by mouth at bedtime. Swallow whole.   Basaglar KwikPen 100 UNIT/ML Inject 22 Units into the skin at bedtime. What changed: how much to take   carvedilol 25 MG tablet Commonly known as: COREG Take 25 mg by mouth 2 (two) times daily.   cloNIDine 0.2 MG tablet Commonly known as: CATAPRES Take 0.4 mg by mouth 2 (two) times daily.   DULoxetine 20 MG capsule Commonly known as: CYMBALTA Take 20-40 mg by mouth See admin instructions. Take 20 mg by mouth in the [name] and 40 mg at bedtime   FreeStyle Libre 2 Reader [place] daily. as directed   FreeStyle Libre 2 Sensor Misc Inject 1 Device into the skin every 14 (fourteen) days.   furosemide 40 MG tablet Commonly known as: LASIX Take 40 mg by mouth in the [name].   insulin aspart 100 UNIT/ML injection Commonly known as: novoLOG Inject 5-15 Units into the skin See admin instructions. Inject 5-15 units into the skin three times a day with meals, PER SLIDING SCALE   omeprazole 40 MG capsule Commonly known as: PRILOSEC Take 1 capsule by mouth once daily   OZEMPIC (0.25 OR 0.5 MG/DOSE)  Inject 0.5 mg into the skin every Saturday.       ASK your doctor about these medications    atorvastatin 10 MG tablet Commonly known as: LIPITOR Take 1 tablet (10 mg total) by mouth daily.   EPINEPHrine 0.3 mg/0.3 mL Soaj injection Commonly known as: EPI-PEN INJECT AS DIRECTED AS NEEDED FOR ALLERGIC REACTION    gabapentin 300 MG capsule Commonly known as: NEURONTIN TAKE 2 CAPSULES BY MOUTH THREE TIMES DAILY   tiZANidine 2 MG tablet Commonly known as: ZANAFLEX TAKE 1 TABLET BY MOUTH ONCE DAILY AT NIGHT AT BEDTIME   traMADol 50 MG tablet Commonly known as: ULTRAM Take 1 tablet by mouth twice daily   Vitamin D (Ergocalciferol) 1.25 MG (50000 UNIT) Caps capsule Commonly known as: DRISDOL Take 1 capsule (50,000 Units total) by mouth every 7 (seven) days.       Allergies  Allergen Reactions   Amlodipine Benzoate Swelling and Other (See Comments)    Leg swelling   Contrast Media [Iodinated Diagnostic Agents] Shortness Of Breath and Other (See Comments)    Difficulty breathing   Iodine Anaphylaxis   Iohexol Hives, Nausea And Vomiting, Swelling and Other (See Comments)     Desc: Magnevist-gadolinium-difficulty breathing, throat swelling    Midazolam Hcl Anaphylaxis    Difficulty breathing   Other Shortness Of Breath, Itching and Other (See Comments)    Patient is allergic to all nuts except peanuts, which  are NOT "nuts"- they are legumes   Shellfish Allergy Anaphylaxis   Shellfish-Derived Products Anaphylaxis   Valsartan Swelling   Metformin And Related Diarrhea, Nausea And Vomiting and Other (See Comments)    Dehydration, also   [place] [Dapagliflozin] Nausea Only and Other (See Comments)    UTI, headaches and muscle aches, also   Hydralazine Other (See Comments)    Reaction not recalled   Saxagliptin Hives, Nausea And Vomiting and Other (See Comments)    ONGLYZA- dehydration, also   Spironolactone Other (See Comments)    Elevated potassium- Severe hyperkalemia   Avandia [Rosiglitazone Maleate] Hives and Other (See Comments)   Geodon [Ziprasidone] Other (See Comments)    Reaction not recalled   Kiwi Extract Itching and Swelling   Latex Itching    Follow-up Information     Tina Morning, DO. Schedule an appointment as soon as possible for a visit in 1 week(s).   Specialty:  Family Medicine Contact information: [address] [place] [address] [phone]                  The results of significant diagnostics from this hospitalization (including imaging, microbiology, ancillary and laboratory) are listed below for reference.    Significant Diagnostic Studies: No results found.  Microbiology: Recent Results (from the past 240 hour(s))  SARS CORONAVIRUS 2 (TAT 6-24 HRS) Nasopharyngeal Nasopharyngeal Swab     Status: None   Collection Time: 08/14/20  4:05 AM   Specimen: Nasopharyngeal Swab  Result Value Ref Range Status   SARS Coronavirus 2 NEGATIVE NEGATIVE Final    Comment: (NOTE) SARS-CoV-2 target nucleic acids are NOT DETECTED.  The SARS-CoV-2 RNA is generally detectable in upper and lower respiratory specimens during the acute phase of infection. Negative results do not preclude SARS-CoV-2 infection, do not rule out co-infections with other pathogens, and should not be used as the sole basis for treatment or other patient management decisions. Negative results must be combined with clinical observations, patient history, and epidemiological information. The expected result is Negative.  Fact Sheet for Patients: SugarRoll.be  Fact Sheet for Healthcare Providers: https://www.woods-mathews.com/  This test is not yet approved or cleared by the [place] FDA and  has been authorized for detection and/or diagnosis of SARS-CoV-2 by FDA under an Emergency Use Authorization (EUA). This EUA will remain  in effect (meaning this test can be used) for the duration of the COVID-19 declaration under Se ction 564(b)(1) of the Act, 21 U.S.C. section 360bbb-3(b)(1), unless the authorization is terminated or revoked sooner.  Performed at [facility], [place] [address], [place], [place] [address]      Labs: Basic Metabolic Panel: Recent Labs  Lab 08/15/20 0316  NA 136  K  3.9  CL 104  CO2 21*  GLUCOSE 375*  BUN 30*  CREATININE 1.29*  CALCIUM 8.3*   Liver Function Tests: No results for input(s): AST, ALT, ALKPHOS, BILITOT, PROT, ALBUMIN in the last 168 hours. No results for input(s): LIPASE, AMYLASE in the last 168 hours. No results for input(s): AMMONIA in the last 168 hours. CBC: Recent Labs  Lab 08/15/20 0316  WBC 11.3*  HGB 11.8*  HCT 36.3  MCV 84.6  PLT 323   Cardiac Enzymes: No results for input(s): CKTOTAL, CKMB, CKMBINDEX, TROPONINI in the last 168 hours. BNP: BNP (last 3 results) No results for input(s): BNP in the last 8760 hours.  ProBNP (last 3 results) No results for input(s): PROBNP in the last 8760  hours.  CBG: Recent Labs  Lab 08/14/20 1616 08/14/20 1618  GLUCAP 502* 468*       Signed:  Domenic Polite MD.  [facility] 08/21/2020, 3:48 PM

## 2020-08-22 DIAGNOSIS — Z09 Encounter for follow-up examination after completed treatment for conditions other than malignant neoplasm: Secondary | ICD-10-CM | POA: Diagnosis not present

## 2020-08-22 DIAGNOSIS — T783XXA Angioneurotic edema, initial encounter: Secondary | ICD-10-CM | POA: Diagnosis not present

## 2020-08-22 DIAGNOSIS — I10 Essential (primary) hypertension: Secondary | ICD-10-CM | POA: Diagnosis not present

## 2020-08-22 LAB — C1 ESTERASE INHIBITOR: C1INH SerPl-mCnc: 44 mg/dL — ABNORMAL HIGH (ref 21–39)

## 2020-08-22 LAB — COMPLEMENT COMPONENT C1Q: Complement C1Q: 17.2 mg/dL (ref 10.3–20.5)

## 2020-08-22 LAB — C1 ESTERASE INHIBITOR, FUNCTIONAL: C1INH Functional/C1INH Total MFr SerPl: >100 %mean normal

## 2020-08-22 LAB — C4 COMPLEMENT: Complement C4, Serum: 46 mg/dL — ABNORMAL HIGH (ref 12–38)

## 2020-08-23 NOTE — Progress Notes (Signed)
Can you please find out if this patient has experienced any relief of itch with adding the high dose antihistamines? Can you please make sure she is at the top of the antihistamine ladder taking cetirizine twice a day and famotidine twice a day. Can you please order a couple more tests? T & B enumeration, NK cells, SPEP, UPEP, and quantitative immunoglobulin? Thank you

## 2020-08-27 ENCOUNTER — Emergency Department (HOSPITAL_COMMUNITY): Payer: Federal, State, Local not specified - PPO

## 2020-08-27 ENCOUNTER — Emergency Department (HOSPITAL_COMMUNITY)
Admission: EM | Admit: 2020-08-27 | Discharge: 2020-08-28 | Disposition: A | Discharge status: Home / Self Care | Payer: Federal, State, Local not specified - PPO | Attending: Emergency Medicine | Admitting: Emergency Medicine

## 2020-08-27 ENCOUNTER — Encounter (HOSPITAL_COMMUNITY): Payer: Self-pay

## 2020-08-27 DIAGNOSIS — Z7982 Long term (current) use of aspirin: Secondary | ICD-10-CM | POA: Insufficient documentation

## 2020-08-27 DIAGNOSIS — Z79899 Other long term (current) drug therapy: Secondary | ICD-10-CM | POA: Insufficient documentation

## 2020-08-27 DIAGNOSIS — E113592 Type 2 diabetes mellitus with proliferative diabetic retinopathy without macular edema, left eye: Secondary | ICD-10-CM | POA: Diagnosis not present

## 2020-08-27 DIAGNOSIS — Z794 Long term (current) use of insulin: Secondary | ICD-10-CM | POA: Insufficient documentation

## 2020-08-27 DIAGNOSIS — I619 Nontraumatic intracerebral hemorrhage, unspecified: Secondary | ICD-10-CM | POA: Diagnosis not present

## 2020-08-27 DIAGNOSIS — I110 Hypertensive heart disease with heart failure: Secondary | ICD-10-CM | POA: Diagnosis not present

## 2020-08-27 DIAGNOSIS — I10 Essential (primary) hypertension: Secondary | ICD-10-CM | POA: Diagnosis not present

## 2020-08-27 DIAGNOSIS — I5032 Chronic diastolic (congestive) heart failure: Secondary | ICD-10-CM | POA: Insufficient documentation

## 2020-08-27 DIAGNOSIS — E1140 Type 2 diabetes mellitus with diabetic neuropathy, unspecified: Secondary | ICD-10-CM | POA: Insufficient documentation

## 2020-08-27 DIAGNOSIS — I6381 Other cerebral infarction due to occlusion or stenosis of small artery: Secondary | ICD-10-CM | POA: Diagnosis not present

## 2020-08-27 DIAGNOSIS — R42 Dizziness and giddiness: Secondary | ICD-10-CM

## 2020-08-27 DIAGNOSIS — R079 Chest pain, unspecified: Secondary | ICD-10-CM

## 2020-08-27 DIAGNOSIS — Z91040 Latex allergy status: Secondary | ICD-10-CM | POA: Insufficient documentation

## 2020-08-27 DIAGNOSIS — R29818 Other symptoms and signs involving the nervous system: Secondary | ICD-10-CM | POA: Diagnosis not present

## 2020-08-27 LAB — BASIC METABOLIC PANEL
Anion gap: 9 (ref 5–15)
BUN: 12 mg/dL (ref 6–20)
CO2: 29 mmol/L (ref 22–32)
Calcium: 9 mg/dL (ref 8.9–10.3)
Chloride: 100 mmol/L (ref 98–111)
Creatinine, Ser: 1.08 mg/dL — ABNORMAL HIGH (ref 0.44–1.00)
GFR, Estimated: >60 mL/min (ref >60)
Glucose, Bld: 288 mg/dL — ABNORMAL HIGH (ref 70–99)
Potassium: 3.8 mmol/L (ref 3.5–5.1)
Sodium: 138 mmol/L (ref 135–145)

## 2020-08-27 LAB — CBC
HCT: 40.4 % (ref 36.0–46.0)
Hemoglobin: 13.1 g/dL (ref 12.0–15.0)
MCH: 27.6 pg (ref 26.0–34.0)
MCHC: 32.4 g/dL (ref 30.0–36.0)
MCV: 85.2 fL (ref 80.0–100.0)
Platelets: 294 10*3/uL (ref 150–400)
RBC: 4.74 MIL/uL (ref 3.87–5.11)
RDW: 13.2 % (ref 11.5–15.5)
WBC: 7.3 10*3/uL (ref 4.0–10.5)
nRBC: 0 % (ref 0.0–0.2)

## 2020-08-27 LAB — TROPONIN I (HIGH SENSITIVITY)
Troponin I (High Sensitivity): 46 ng/L — ABNORMAL HIGH (ref <18)
Troponin I (High Sensitivity): 43 ng/L — ABNORMAL HIGH (ref <18)

## 2020-08-27 MED ORDER — CARVEDILOL 12.5 MG PO TABS
25.0000 mg | ORAL_TABLET | Freq: Once | ORAL | Status: AC
  Administered 2020-08-27: 25 mg via ORAL
  Filled 2020-08-27: qty 2

## 2020-08-27 MED ORDER — CLONIDINE HCL 0.2 MG PO TABS
0.2000 mg | ORAL_TABLET | Freq: Once | ORAL | Status: AC
  Administered 2020-08-27: 0.2 mg via ORAL
  Filled 2020-08-27: qty 1

## 2020-08-27 MED ORDER — LORAZEPAM 2 MG/ML IJ SOLN
1.0000 mg | Freq: Once | INTRAMUSCULAR | Status: AC | PRN
  Administered 2020-08-28: 1 mg via INTRAVENOUS
  Filled 2020-08-27: qty 1

## 2020-08-27 MED ORDER — TRAMADOL HCL 50 MG PO TABS
50.0000 mg | ORAL_TABLET | Freq: Once | ORAL | Status: AC
  Administered 2020-08-27: 50 mg via ORAL
  Filled 2020-08-27: qty 1

## 2020-08-27 NOTE — ED Provider Notes (Addendum)
Emergency Medicine Provider Triage Evaluation Note  Tina [name] , a 50 y.o. female  was evaluated in triage.  Pt complains of chest pain that started earlier today around 12pm. She further c/o headache, dizziness, and vision changes bilaterally. Bp has been high, no new numbness/weakness.  Review of Systems  Positive: Chest pain, headache, dizziness, vision changes Negative: Numbness/weakness  Physical Exam  BP (!) 228/95 (BP Location: Right Arm)   Pulse 86   Temp 98.7 F (37.1 C) (Oral)   Resp (!) 22   SpO2 94%  Gen:   Awake, no distress   Resp:  Normal effort  MSK:   Moves extremities without difficulty  Other:  Chronic right facial droop, no aphasia, decreased sensation to the rue/rle which is chronic and unchanged per patient, no unilateral weakness on exam. Perrl, eoms intact, dp pulses 2+ and symmetric  Medical Decision Making  Medically screening exam initiated at 9:33 PM.  Appropriate orders placed.  Bessye SIDRA BRUE was informed that the remainder of the evaluation will be completed by another provider, this initial triage assessment does not replace that evaluation, and the importance of remaining in the ED until their evaluation is complete.  9:30PM pt c/o increased cp and sob. Brought back to triage for repeat ekg. Trop resulted and marginally elevated. Advised nursing pt needs to be prioritized for a room   Rodney Booze, PA-C 08/27/20 1945    Rodney Booze, PA-C 08/27/20 2133    Deno Etienne, DO 08/27/20 2251

## 2020-08-27 NOTE — ED Provider Notes (Signed)
[facility] EMERGENCY DEPARTMENT Provider Note   CSN: [id number] Arrival date & time: 08/27/20  1918     History Chief Complaint  Patient presents with   Dizziness    Tina [name] is a 50 y.o. female.  The history is provided by the patient and medical records.  Dizziness Associated symptoms: chest pain and headaches    50 y.o. F with hx of seasonal allergies, anemia, prior stroke, depression, DM, GERD, HTN, anxiety, presenting to the ED for feeling dizzy.  States she was hospitalized about 2 weeks ago due to angioedema from one of her BP medications (felt to be likely Imdur).  States since stopping this her BP has been running higher than normal, currently working with PCP to try and find suitable replacement but unfortunately she does have quite a few allergies to other BP agents as well.  States today she was at [employer] when she developed episode of central chest pain which she describes as "muscles getting tight" then had onset of headache, blurred vision, and confusion per her husband so EMS was called.  States currently, headache is mainly localized to back of her head but starting to feel it throughout the rest of her head as well.  She states her vision still feels intermittently blurred but denies vision loss or field cuts.  She does admit to word findings difficulties that are also intermittent (when talking with friends on phone), etc.  She denies any recent illness/fevers.  Does admit to prior strokes in 2011, sees neurology once a year for this.  Denies any active chest pain.  Past Medical History:  Diagnosis Date   Allergy    Anemia    DURING MENSES--HAS HEAVY BLEEDING WITH PERODS   Angioedema 08/13/2020   Anxiety    Back pain, chronic    "ongoing"   Blood transfusion    IN 2012  AFTER C -SECTION   Cerebral thrombosis with cerebral infarction [facility]) JUNE 2011   RIGHT SIDED WEAKNESS ( ARM AND LEG ) AND SPASMS-remains with slight weakness  and vertigo.   Constipation    Depression    Diabetes mellitus    Diabetic neuropathy (HCC)    BOTH FEET --COMES AND GOES   Edema, lower extremity    Endometriosis    Fatty liver    GERD (gastroesophageal reflux disease)    with pregnancy   H/O eye surgery    Headache(784.0)    MIGRAINES--NOT REALLY HEADACHE-MORE LIKE PRESSURE SENSATION IN HEAD-FEELS DIZZIY AND  FAINT AS THE PRESSURE RESOLVES   Hernia, incisional, RLQ, s/p lap repair Sep 2013 09/02/2011   History of vertigo 03/22/2018   Hx of migraines 10/19/2011   Hypertension    Leg pain, right    "like bad Charley horse"   Multiple food allergies    Panic disorder without agoraphobia    Rash    HANDS, ARMS --STATES HX OF RASH EVER SINCE CHILDBIRTH/PREGNANCY.  STATES THE RASH OFTEN OCCURS WHEN SHE IS REALLY STRESSED."goes and comes-presently left ring finger"   Restless leg syndrome    DIAGNOSED BY SLEEP STUDY - PT TOLD SHE DID NOT HAVE SLEEP APNEA   Right rotator cuff tear    PAIN IN RIGHT SHOULDER   SBO (small bowel obstruction) ([place]) 06/03/2012   Shortness of breath    Spastic hemiplegia affecting dominant side (HCC)    Stomach problems    Stroke (HCC)    Ventral hernia    RIGHT LOWER QUADRANT-CAUSING SOME PAIN  Weakness of right side of body     Patient Active Problem List   Diagnosis Date Noted   Shortness of breath 08/18/2020   Allergy to alpha-gal 08/18/2020   Seasonal and perennial allergic rhinitis 08/18/2020   Allergy with anaphylaxis due to food 08/18/2020   Pruritus 08/18/2020   Pollen-food allergy 08/18/2020   Allergic reaction 08/13/2020   Angioedema 08/13/2020   Type 2 macular telangiectasis, bilateral 01/03/2020   Excessive daytime sleepiness 09/12/2019   Sleep paralysis 09/12/2019   Hypnopompic hallucination 09/12/2019   Abnormal dreams 09/12/2019   Diabetic macular edema of right eye with proliferative retinopathy associated with type 2 diabetes mellitus ([place]) 07/04/2019   Stable treated  proliferative diabetic retinopathy of left eye determined by examination associated with type 2 diabetes mellitus ([place]) 07/04/2019   Long-term insulin use ([place]) 04/07/2019   Depression 04/13/2018   History of vertigo 03/22/2018   Laboratory examination 03/22/2018   Chronic diastolic (congestive) heart failure ([place]) 03/22/2018   Other hyperlipidemia 02/15/2018   Vitamin D deficiency 12/31/2017   Class 2 severe obesity with serious comorbidity and body mass index (BMI) of 39.0 to 39.9 in adult ([place]) 11/18/2017   Gait disturbance, post-stroke 04/23/2017   Strain of gluteus medius 04/23/2017   OSA (obstructive sleep apnea) 10/17/2015   Periodic limb movement disorder (PLMD) 10/17/2015   Essential hypertension 05/26/2015   Diabetes mellitus ([place]) 05/26/2015   Abdominal wall mass of right lower quadrant 05/25/2015   Hemiparesis affecting right side as late effect of stroke ([place]) 11/09/2014   Abdominal wall seroma    Sepsis due to undetermined organism ([place]) 03/06/2014   Nausea vomiting and diarrhea 03/06/2014   Sepsis ([place]) 03/06/2014   Tension headache 09/01/2013   Nausea with vomiting ?Gastroparesis? 03/21/2013   Spastic hemiplegia affecting dominant side ([place]) 02/16/2013   Recurrent ventral incisional hernia s/p closure/repair w mesh 02/04/2013 01/26/2013   Constipation, chronic 01/26/2013   Abdominal pain, chronic, right lower quadrant 06/03/2012   History of CVA (cerebrovascular accident) 06/03/2012   HTN (hypertension) 06/03/2012   Diabetes mellitus type II, uncontrolled ([place]) 10/16/2011   Obesity (BMI 30-39.9) 09/02/2011   Rotator cuff tear 08/25/2011   Sciatica 06/10/2011   Paresthesias in right hand 06/10/2011   Lumbago 05/09/2011   Paresthesias 05/09/2011    Past Surgical History:  Procedure Laterality Date   APPLICATION OF WOUND VAC N/A 05/25/2015   Procedure: APPLICATION OF WOUND VAC;  Surgeon: Michael Boston, MD;  Location: [facility] ORS;  Service: General;  Laterality: N/A;    CESAREAN SECTION  2012   COLONOSCOPY     DIAGNOSTIC LAPAROSCOPY     ESOPHAGOGASTRODUODENOSCOPY N/A 06/03/2012   Procedure: ESOPHAGOGASTRODUODENOSCOPY (EGD);  Surgeon: [name] Craver, MD;  Location: [facility] ENDOSCOPY;  Service: Endoscopy;  Laterality: N/A;   EXCISION MASS ABDOMINAL N/A 05/25/2015   Procedure: ABDOMINAL WALL EXPLORATION EXCISION OF SEROMA REMOVAL OF REDUNDANT SKIN ;  Surgeon: Michael Boston, MD;  Location: [facility] ORS;  Service: General;  Laterality: N/A;   EYE SURGERY     Eye laser for vessel hemorrhaging   fybroid removal     HERNIA REPAIR  10/03/11   ventral hernia repair   INSERTION OF MESH N/A 02/04/2013   Procedure: INSERTION OF MESH;  Surgeon: Adin Hector, MD;  Location: [facility]S;  Service: General;  Laterality: N/A;   UMBILICAL HERNIA REPAIR N/A 02/04/2013   Procedure: LAPAROSCOPIC ventral wall hernia repair LAPAROSCOPIC LYSIS OF ADHESIONS laparoscopic exploration of abdomen ;  Surgeon: Adin Hector, MD;  Location: [facility];  Service: General;  Laterality: N/A;   UPPER GASTROINTESTINAL ENDOSCOPY     URETER REVISION     Bilateral "twisted"   UTERINE FIBROID SURGERY     2 SURGERIES FOR FIBROIDS   VENTRAL HERNIA REPAIR  10/03/2011   Procedure: LAPAROSCOPIC VENTRAL HERNIA;  Surgeon: Adin Hector, MD;  Location: [facility]S;  Service: General;  Laterality: N/A;     OB History     Gravida  2   Para  2   Term  0   Preterm  1   AB  0   Living  1      SAB  0   IAB  0   Ectopic  0   Multiple  0   Live Births              Family History  Problem Relation Age of Onset   Diabetes Father    Kidney disease Father    Depression Father    Drug abuse Father    Allergic rhinitis Mother    Eczema Mother    Urticaria Mother    Depression Mother    Anxiety disorder Mother    Bipolar disorder Mother    Alcoholism Mother    Drug abuse Mother    Eating disorder Mother    Diabetes Maternal Grandmother    Hyperlipidemia Paternal Grandmother    Stroke Paternal  Grandmother    Eczema Sister    Urticaria Sister    Colon cancer Paternal Uncle    Other Neg Hx    Angioedema Neg Hx    Asthma Neg Hx    Colon polyps Neg Hx    Esophageal cancer Neg Hx    Rectal cancer Neg Hx    Stomach cancer Neg Hx     Social History   Tobacco Use   Smoking status: Never   Smokeless tobacco: Never  Vaping Use   Vaping Use: Never used  Substance Use Topics   Alcohol use: No    Alcohol/week: 0.0 standard drinks   Drug use: No    Home Medications Prior to Admission medications   Medication Sig Start Date End Date Taking? Authorizing Provider  albuterol (VENTOLIN HFA) 108 (90 Base) MCG/ACT inhaler Inhale 2 puffs into the lungs every 4 (four) hours as needed for wheezing or shortness of breath. 08/16/20   Dara Hoyer, FNP  allopurinol (ZYLOPRIM) 100 MG tablet Take 100 mg by mouth at bedtime. 03/13/14   [provider]  aspirin EC 81 MG tablet Take 81 mg by mouth at bedtime. Swallow whole.    [provider]  atorvastatin (LIPITOR) 10 MG tablet Take 1 tablet (10 mg total) by mouth daily. Patient taking differently: Take 10 mg by mouth at bedtime. 06/23/18   Whitmire, Joneen Boers, FNP  carvedilol (COREG) 25 MG tablet Take 25 mg by mouth 2 (two) times daily. 04/02/20   [provider]  cetirizine (ZYRTEC) 10 MG tablet Take 1 tablet by mouth 1-2 times a day as directed for itching 08/16/20   Ambs, Kathrine Cords, FNP  cloNIDine (CATAPRES) 0.2 MG tablet Take 0.4 mg by mouth 2 (two) times daily. 08/13/19   [provider]  Continuous Blood Gluc Receiver (FREESTYLE LIBRE 2 READER) DEVI daily. as directed 04/13/19   [provider]  Continuous Blood Gluc Sensor (FREESTYLE LIBRE 2 SENSOR) MISC Inject 1 Device into the skin every 14 (fourteen) days. 08/23/19   [provider]  DULoxetine (CYMBALTA) 20 MG capsule  Take 20-40 mg by mouth See admin instructions. Take 20 mg by mouth in the morning and 40 mg at bedtime    [provider]   EPINEPHrine (AUVI-Q) 0.3 mg/0.3 mL IJ SOAJ injection Inject 0.3 mg into the muscle as needed for anaphylaxis. 08/16/20   Dara Hoyer, FNP  famotidine (PEPCID) 20 MG tablet Take 1 tablet (20 mg total) by mouth 2 (two) times daily. 08/16/20   Dara Hoyer, FNP  furosemide (LASIX) 40 MG tablet Take 40 mg by mouth in the morning.    [provider]  gabapentin (NEURONTIN) 300 MG capsule TAKE 2 CAPSULES BY MOUTH THREE TIMES DAILY Patient taking differently: Take 300 mg by mouth at bedtime. 08/15/19   Kirsteins, Luanna Salk, MD  insulin aspart (NOVOLOG) 100 UNIT/ML injection Inject 5-15 Units into the skin See admin instructions. Inject 5-15 units into the skin three times a day with meals, PER SLIDING SCALE    [provider]  Insulin Glargine (BASAGLAR KWIKPEN) 100 UNIT/ML Inject 22 Units into the skin at bedtime. 08/15/20   Domenic Polite, MD  omeprazole (PRILOSEC) 40 MG capsule Take 1 capsule by mouth once daily 02/09/19   Armbruster, Carlota Raspberry, MD  Semaglutide (OZEMPIC, 0.25 OR 0.5 MG/DOSE, [place]) Inject 0.5 mg into the skin every Saturday.    [provider]  tiZANidine (ZANAFLEX) 2 MG tablet TAKE 1 TABLET BY MOUTH ONCE DAILY AT NIGHT AT BEDTIME Patient taking differently: Take 2 mg by mouth at bedtime. 06/19/20   Kirsteins, Luanna Salk, MD  traMADol (ULTRAM) 50 MG tablet Take 1 tablet by mouth twice daily Patient taking differently: Take 50 mg by mouth at bedtime. 05/01/20   Kirsteins, Luanna Salk, MD  Vitamin D, Ergocalciferol, (DRISDOL) 1.25 MG (50000 UNIT) CAPS capsule Take 1 capsule (50,000 Units total) by mouth every 7 (seven) days. Patient taking differently: Take 50,000 Units by mouth every Friday. 08/10/19   Dennard Nip D, MD  diphenhydrAMINE (BENADRYL) 25 MG tablet Take 1 tablet (25 mg total) by mouth every 8 (eight) hours as needed for up to 2 days for itching. 08/14/20 08/15/20  Domenic Polite, MD    Allergies    Amlodipine benzoate, Contrast media [iodinated diagnostic agents],  Iodine, Iohexol, Midazolam hcl, Other, Shellfish allergy, Shellfish-derived products, Valsartan, Metformin and related, Farxiga [dapagliflozin], Hydralazine, Saxagliptin, Spironolactone, Avandia [rosiglitazone maleate], Geodon [ziprasidone], Kiwi extract, and Latex  Review of Systems   Review of Systems  Eyes:  Positive for photophobia.  Cardiovascular:  Positive for chest pain.  Neurological:  Positive for dizziness and headaches.  All other systems reviewed and are negative.  Physical Exam Updated Vital Signs BP (!) 213/103   Pulse 85   Temp 98.7 F (37.1 C) (Oral)   Resp 19   SpO2 100%   Physical Exam Vitals and nursing note reviewed.  Constitutional:      Appearance: She is well-developed.  HENT:     Head: Normocephalic and atraumatic.  Eyes:     Conjunctiva/sclera: Conjunctivae normal.     Pupils: Pupils are equal, round, and reactive to light.     Comments: Photophobia noted during exam  Cardiovascular:     Rate and Rhythm: Normal rate and regular rhythm.     Heart sounds: Normal heart sounds.  Pulmonary:     Effort: Pulmonary effort is normal.     Breath sounds: Normal breath sounds.  Abdominal:     General: Bowel sounds are normal.     Palpations: Abdomen is soft.  Musculoskeletal:        General: Normal range of motion.     Cervical back: Normal range of motion.  Skin:    General: Skin is warm and dry.  Neurological:     Mental Status: She is alert and oriented to person, place, and time.     Comments: AAOx3, answering questions and following commands appropriately; equal strength UE and LE bilaterally; CN grossly intact; moves all extremities appropriately without ataxia; no focal neuro deficits or facial asymmetry appreciated, speech is clear and goal oriented, no noted aphasia or word findings difficulties during exam    ED Results / Procedures / Treatments   Labs (all labs ordered are listed, but only abnormal results are displayed) Labs Reviewed  BASIC  METABOLIC PANEL - Abnormal; Notable for the following components:      Result Value   Glucose, Bld 288 (*)    Creatinine, Ser 1.08 (*)    All other components within normal limits  TROPONIN I (HIGH SENSITIVITY) - Abnormal; Notable for the following components:   Troponin I (High Sensitivity) 46 (*)    All other components within normal limits  CBC  TROPONIN I (HIGH SENSITIVITY)    EKG EKG Interpretation  Date/Time:  Monday August 27 2020 21:29:47 EDT Ventricular Rate:  88 PR Interval:  138 QRS Duration: 76 QT Interval:  400 QTC Calculation: 484 R Axis:   44 Text Interpretation: Normal sinus rhythm Nonspecific T wave abnormality Prolonged QT Abnormal ECG No significant change since last tracing Confirmed by Lacretia Leigh ([id number]) on 08/27/2020 10:34:35 PM  Radiology DG Chest 1 View  Result Date: 08/27/2020 CLINICAL DATA:  Dizziness and chest pain. Hx of DM, GERD, HTN, stroke. Pt is a nonsmoker. EXAM: CHEST  1 VIEW COMPARISON:  Chest x-ray 05/15/2020, CT chest 04/07/2004, chest x-ray 11/18/2015 FINDINGS: Slightly more prominent enlarged cardiac silhouette. Otherwise the heart size and mediastinal contours are unchanged. No focal consolidation. No pulmonary edema. No pleural effusion. No pneumothorax. No acute osseous abnormality. IMPRESSION: Slightly more prominent enlarged cardiac silhouette with some component that may be due to an AP portable technique. Recommend repeat PA and lateral view of the chest. Electronically Signed   By: Iven Finn M.D.   On: 08/27/2020 20:47   CT HEAD WO CONTRAST (5MM)  Result Date: 08/27/2020 CLINICAL DATA:  Headache, intracranial hemorrhage suspected Patient reports headache, dizziness, and vision changes. Elevated blood pressure. EXAM: CT HEAD WITHOUT CONTRAST TECHNIQUE: Contiguous axial images were obtained from the base of the skull through the vertex without intravenous contrast. COMPARISON:  Brain MRI 10/07/2013, no interval brain imaging  available. FINDINGS: Brain: No intracranial hemorrhage, mass effect, or midline shift. Brain volume is normal for age. Left caudate lacunar infarct is remote but new from 2015. Small remote lacunar infarcts in the left pons and basal ganglia are otherwise unchanged. No hydrocephalus. The basilar cisterns are patent. No evidence of territorial infarct or acute ischemia. No extra-axial or intracranial fluid collection. Vascular: Atherosclerosis of skullbase vasculature without hyperdense vessel or abnormal calcification. Skull: No fracture or focal lesion. Sinuses/Orbits: Paranasal sinuses and mastoid air cells are clear. The visualized orbits are unremarkable. Bilateral cataract resection. Other: None. IMPRESSION: 1. No acute intracranial abnormality. 2. Remote lacunar infarcts in the left basal ganglia, left pons, and left caudate. Electronically Signed   By: Keith Rake M.D.   On: 08/27/2020 21:15    Procedures Procedures   Medications Ordered in ED Medications  LORazepam (ATIVAN) injection 1 mg (has no administration  in time range)  cloNIDine (CATAPRES) tablet 0.2 mg (0.2 mg Oral Given 08/27/20 2308)  carvedilol (COREG) tablet 25 mg (25 mg Oral Given 08/27/20 2308)  traMADol (ULTRAM) tablet 50 mg (50 mg Oral Given 08/27/20 2308)    ED Course  I have reviewed the triage vital signs and the nursing notes.  Pertinent labs & imaging results that were available during my care of the patient were reviewed by me and considered in my medical decision making (see chart for details).    MDM Rules/Calculators/A&P                           50 y.o. F here with headache, dizziness, intermittent word finding difficulties and episode of chest pain.  Has had elevated BP over the past 2 weeks due to adverse reaction from one of her BP meds (? Imdur).  She has several other allergies, PCP is working to find suitable replacement.  BP on arrival 219/98.  She is awake, alert, oriented.  Labs as above-- elevated  trop at 46, EKG unchanged.  No active chest pain and sounds atypical from her description.  Suspect this is related to her uncontrolled BP.  CT head negative for acute findings, remote infarcts seen.  She does not have any focal deficits during my exam but given her reports word finding difficulties while talking with family earlier and elevated BP, will obtain MRI to ensure no acute stroke.  Delta trop pending.  Given her evening BP meds.  Delta trop flat/downtrending at 4.  BP has improved significantly with her home meds, now 152/76.  Headache has improved.  MRI is without acute findings here.  As pressures are controlled, troponins are flat, and non focal neurologic exam, feel she is stable for discharge home.  Encouraged close follow-up with PCP for ongoing management of BP, can also follow-up with her OP neurologist.  Return here for any new/acute changes.  Discussed with attending physician, Dr. Zenia Resides, who agrees with plan of care.  Final Clinical Impression(s) / ED Diagnoses Final diagnoses:  Dizziness  Hypertension, unspecified type    Rx / DC Orders ED Discharge Orders     None        Larene Pickett, PA-C 08/28/20 0325    Lacretia Leigh, MD 08/30/20 1349

## 2020-08-27 NOTE — ED Triage Notes (Signed)
Pt states that she was at [place] and began to have CP/dizziness, checked her BP and reports that it was [id number] systolic.

## 2020-08-28 ENCOUNTER — Emergency Department (HOSPITAL_COMMUNITY): Payer: Federal, State, Local not specified - PPO

## 2020-08-28 ENCOUNTER — Encounter: Payer: Federal, State, Local not specified - PPO | Admitting: Physical Medicine & Rehabilitation

## 2020-08-28 DIAGNOSIS — R29818 Other symptoms and signs involving the nervous system: Secondary | ICD-10-CM | POA: Diagnosis not present

## 2020-08-28 DIAGNOSIS — I619 Nontraumatic intracerebral hemorrhage, unspecified: Secondary | ICD-10-CM | POA: Diagnosis not present

## 2020-08-28 DIAGNOSIS — I110 Hypertensive heart disease with heart failure: Secondary | ICD-10-CM | POA: Diagnosis not present

## 2020-08-28 NOTE — ED Notes (Signed)
Pt calling spouse to come pick her up

## 2020-08-28 NOTE — ED Notes (Signed)
Patient verbalizes understanding of discharge instructions. Opportunity for questioning and answers were provided. Armband removed by staff, pt discharged from ED via wheelchair. Pt in lobby waiting for husband to arrive.

## 2020-08-28 NOTE — ED Notes (Signed)
Pt transported to MRI 

## 2020-08-28 NOTE — Discharge Instructions (Addendum)
Your MRI today did not show any new strokes.  Your symptoms seem to be related to uncontrolled blood pressure. Continue your blood pressure medication as directed, make sure not to skip doses. Please follow-up with your primary care doctor. Return here for any new/acute changes.

## 2020-08-30 ENCOUNTER — Encounter (INDEPENDENT_AMBULATORY_CARE_PROVIDER_SITE_OTHER): Payer: Self-pay | Admitting: Ophthalmology

## 2020-08-30 ENCOUNTER — Ambulatory Visit (INDEPENDENT_AMBULATORY_CARE_PROVIDER_SITE_OTHER): Payer: Federal, State, Local not specified - PPO | Admitting: Ophthalmology

## 2020-08-30 ENCOUNTER — Other Ambulatory Visit: Payer: Self-pay

## 2020-08-30 DIAGNOSIS — H35073 Retinal telangiectasis, bilateral: Secondary | ICD-10-CM

## 2020-08-30 DIAGNOSIS — E113552 Type 2 diabetes mellitus with stable proliferative diabetic retinopathy, left eye: Secondary | ICD-10-CM

## 2020-08-30 DIAGNOSIS — E113511 Type 2 diabetes mellitus with proliferative diabetic retinopathy with macular edema, right eye: Secondary | ICD-10-CM | POA: Diagnosis not present

## 2020-08-30 NOTE — Progress Notes (Signed)
08/30/2020     CHIEF COMPLAINT Patient presents for Retina Follow Up   HISTORY OF PRESENT ILLNESS: Tina [name] is a 50 y.o. female who presents to the clinic today for:   HPI     Retina Follow Up           Diagnosis: Diabetic Retinopathy   Laterality: both eyes   Onset: 7 months ago   Severity: mild   Duration: 7 months   Course: rapidly worsening         Comments   7 month fu OU and OCT Pt states, "I am having a hard time regulating my blood pressure and I am having reactions to many blood pressure medicines. I have been in the hospital and just got out on Tuesday for my blood pressure being 240/132. I am having bad headaches and my vision is constantly blurry." A1C:7.0 LBS:132      Last edited by Kendra Opitz, COA on 08/30/2020  9:01 AM.      Referring physician: [name], DO [address] [place],  [address] [address]  HISTORICAL INFORMATION:   Selected notes from the MEDICAL RECORD NUMBER    Lab Results  Component Value Date   HGBA1C 8.4 (H) 08/13/2020     CURRENT MEDICATIONS: No current outpatient medications on file. (Ophthalmic Drugs)   No current facility-administered medications for this visit. (Ophthalmic Drugs)   Current Outpatient Medications (Other)  Medication Sig   albuterol (VENTOLIN HFA) 108 (90 Base) MCG/ACT inhaler Inhale 2 puffs into the lungs every 4 (four) hours as needed for wheezing or shortness of breath.   allopurinol (ZYLOPRIM) 100 MG tablet Take 100 mg by mouth at bedtime.   aspirin EC 81 MG tablet Take 81 mg by mouth at bedtime. Swallow whole.   atorvastatin (LIPITOR) 10 MG tablet Take 1 tablet (10 mg total) by mouth daily. (Patient taking differently: Take 10 mg by mouth at bedtime.)   carvedilol (COREG) 25 MG tablet Take 25 mg by mouth 2 (two) times daily.   cetirizine (ZYRTEC) 10 MG tablet Take 1 tablet by mouth 1-2 times a day as directed for itching   cetirizine (ZYRTEC) 10 MG tablet SMARTSIG:1  Tablet(s) By Mouth 1-2 Times Daily   cloNIDine (CATAPRES) 0.2 MG tablet Take 0.4 mg by mouth 2 (two) times daily.   Continuous Blood Gluc Receiver (FREESTYLE LIBRE 2 READER) DEVI daily. as directed   Continuous Blood Gluc Sensor (FREESTYLE LIBRE 2 SENSOR) MISC Inject 1 Device into the skin every 14 (fourteen) days.   DULoxetine (CYMBALTA) 20 MG capsule Take 20-40 mg by mouth See admin instructions. Take 20 mg by mouth in the morning and 40 mg at bedtime   EPINEPHrine (AUVI-Q) 0.3 mg/0.3 mL IJ SOAJ injection Inject 0.3 mg into the muscle as needed for anaphylaxis.   famotidine (PEPCID) 20 MG tablet Take 1 tablet (20 mg total) by mouth 2 (two) times daily.   furosemide (LASIX) 40 MG tablet Take 40 mg by mouth in the morning.   gabapentin (NEURONTIN) 300 MG capsule TAKE 2 CAPSULES BY MOUTH THREE TIMES DAILY (Patient taking differently: Take 300 mg by mouth at bedtime.)   insulin aspart (NOVOLOG) 100 UNIT/ML injection Inject 5-15 Units into the skin See admin instructions. Inject 5-15 units into the skin three times a day with meals, PER SLIDING SCALE   Insulin Glargine (BASAGLAR KWIKPEN) 100 UNIT/ML Inject 22 Units into the skin at bedtime.   omeprazole (PRILOSEC) 40 MG capsule Take  1 capsule by mouth once daily   Semaglutide (OZEMPIC, 0.25 OR 0.5 MG/DOSE, [place]) Inject 0.5 mg into the skin every Saturday.   tiZANidine (ZANAFLEX) 2 MG tablet TAKE 1 TABLET BY MOUTH ONCE DAILY AT NIGHT AT BEDTIME (Patient taking differently: Take 2 mg by mouth at bedtime.)   traMADol (ULTRAM) 50 MG tablet Take 1 tablet by mouth twice daily (Patient taking differently: Take 50 mg by mouth at bedtime.)   Vitamin D, Ergocalciferol, (DRISDOL) 1.25 MG (50000 UNIT) CAPS capsule Take 1 capsule (50,000 Units total) by mouth every 7 (seven) days. (Patient taking differently: Take 50,000 Units by mouth every Friday.)   No current facility-administered medications for this visit. (Other)      REVIEW OF  SYSTEMS:    ALLERGIES Allergies  Allergen Reactions   Amlodipine Benzoate Swelling and Other (See Comments)    Leg swelling   Contrast Media [Iodinated Diagnostic Agents] Shortness Of Breath and Other (See Comments)    Difficulty breathing   Iodine Anaphylaxis   Iohexol Hives, Nausea And Vomiting, Swelling and Other (See Comments)     Desc: Magnevist-gadolinium-difficulty breathing, throat swelling    Midazolam Hcl Anaphylaxis    Difficulty breathing   Other Shortness Of Breath, Itching and Other (See Comments)    Patient is allergic to all nuts except peanuts, which are NOT "nuts"- they are legumes   Shellfish Allergy Anaphylaxis   Shellfish-Derived Products Anaphylaxis   Valsartan Swelling   Metformin And Related Diarrhea, Nausea And Vomiting and Other (See Comments)    Dehydration, also   [place] [Dapagliflozin] Nausea Only and Other (See Comments)    UTI, headaches and muscle aches, also   Hydralazine Other (See Comments)    Reaction not recalled   Saxagliptin Hives, Nausea And Vomiting and Other (See Comments)    ONGLYZA- dehydration, also   Spironolactone Other (See Comments)    Elevated potassium- Severe hyperkalemia   Avandia [Rosiglitazone Maleate] Hives and Other (See Comments)   Geodon [Ziprasidone] Other (See Comments)    Reaction not recalled   Kiwi Extract Itching and Swelling   Latex Itching    PAST MEDICAL HISTORY Past Medical History:  Diagnosis Date   Allergy    Anemia    DURING MENSES--HAS HEAVY BLEEDING WITH PERODS   Angioedema 08/13/2020   Anxiety    Back pain, chronic    "ongoing"   Blood transfusion    IN 2012  AFTER C -SECTION   Cerebral thrombosis with cerebral infarction [facility]) JUNE 2011   RIGHT SIDED WEAKNESS ( ARM AND LEG ) AND SPASMS-remains with slight weakness and vertigo.   Constipation    Depression    Diabetes mellitus    Diabetic neuropathy (HCC)    BOTH FEET --COMES AND GOES   Edema, lower extremity    Endometriosis    Fatty  liver    GERD (gastroesophageal reflux disease)    with pregnancy   H/O eye surgery    Headache(784.0)    MIGRAINES--NOT REALLY HEADACHE-MORE LIKE PRESSURE SENSATION IN HEAD-FEELS DIZZIY AND  FAINT AS THE PRESSURE RESOLVES   Hernia, incisional, RLQ, s/p lap repair Sep 2013 09/02/2011   History of vertigo 03/22/2018   Hx of migraines 10/19/2011   Hypertension    Leg pain, right    "like bad Charley horse"   Multiple food allergies    Panic disorder without agoraphobia    Rash    HANDS, ARMS --STATES HX OF RASH EVER SINCE CHILDBIRTH/PREGNANCY.  [place]  WHEN SHE IS REALLY STRESSED."goes and comes-presently left ring finger"   Restless leg syndrome    DIAGNOSED BY SLEEP STUDY - PT TOLD SHE DID NOT HAVE SLEEP APNEA   Right rotator cuff tear    PAIN IN RIGHT SHOULDER   SBO (small bowel obstruction) ([place]) 06/03/2012   Shortness of breath    Spastic hemiplegia affecting dominant side (HCC)    Stomach problems    Stroke (HCC)    Ventral hernia    RIGHT LOWER QUADRANT-CAUSING SOME PAIN   Weakness of right side of body    Past Surgical History:  Procedure Laterality Date   APPLICATION OF WOUND VAC N/A 05/25/2015   Procedure: APPLICATION OF WOUND VAC;  Surgeon: Michael Boston, MD;  Location: [facility];  Service: General;  Laterality: N/A;   CESAREAN SECTION  2012   COLONOSCOPY     DIAGNOSTIC LAPAROSCOPY     ESOPHAGOGASTRODUODENOSCOPY N/A 06/03/2012   Procedure: ESOPHAGOGASTRODUODENOSCOPY (EGD);  Surgeon: [name] Craver, MD;  Location: [facility] ENDOSCOPY;  Service: Endoscopy;  Laterality: N/A;   EXCISION MASS ABDOMINAL N/A 05/25/2015   Procedure: ABDOMINAL WALL EXPLORATION EXCISION OF SEROMA REMOVAL OF REDUNDANT SKIN ;  Surgeon: Michael Boston, MD;  Location: [facility];  Service: General;  Laterality: N/A;   EYE SURGERY     Eye laser for vessel hemorrhaging   fybroid removal     HERNIA REPAIR  10/03/11   ventral hernia repair   INSERTION OF MESH N/A 02/04/2013   Procedure: INSERTION OF MESH;   Surgeon: Adin Hector, MD;  Location: [facility];  Service: General;  Laterality: N/A;   UMBILICAL HERNIA REPAIR N/A 02/04/2013   Procedure: LAPAROSCOPIC ventral wall hernia repair LAPAROSCOPIC LYSIS OF ADHESIONS laparoscopic exploration of abdomen ;  Surgeon: Adin Hector, MD;  Location: [facility];  Service: General;  Laterality: N/A;   UPPER GASTROINTESTINAL ENDOSCOPY     URETER REVISION     Bilateral "twisted"   UTERINE FIBROID SURGERY     2 SURGERIES FOR FIBROIDS   VENTRAL HERNIA REPAIR  10/03/2011   Procedure: LAPAROSCOPIC VENTRAL HERNIA;  Surgeon: Adin Hector, MD;  Location: [facility];  Service: General;  Laterality: N/A;    FAMILY HISTORY Family History  Problem Relation Age of Onset   Diabetes Father    Kidney disease Father    Depression Father    Drug abuse Father    Allergic rhinitis Mother    Eczema Mother    Urticaria Mother    Depression Mother    Anxiety disorder Mother    Bipolar disorder Mother    Alcoholism Mother    Drug abuse Mother    Eating disorder Mother    Diabetes Maternal Grandmother    Hyperlipidemia Paternal Grandmother    Stroke Paternal Grandmother    Eczema Sister    Urticaria Sister    Colon cancer Paternal Uncle    Other Neg Hx    Angioedema Neg Hx    Asthma Neg Hx    Colon polyps Neg Hx    Esophageal cancer Neg Hx    Rectal cancer Neg Hx    Stomach cancer Neg Hx     SOCIAL HISTORY Social History   Tobacco Use   Smoking status: Never   Smokeless tobacco: Never  Vaping Use   Vaping Use: Never used  Substance Use Topics   Alcohol use: No    Alcohol/week: 0.0 standard drinks   Drug use: No         OPHTHALMIC  EXAM:  Base Eye Exam     Visual Acuity (ETDRS)       Right Left   Dist [place] 20/40 -1 20/50   Dist ph [place] NI NI         Tonometry (Tonopen, 9:06 AM)       Right Left   Pressure 10 11         Pupils       Pupils Dark Light Shape React APD   Right PERRL 4 3 Round Brisk None   Left PERRL 4 3 Round Brisk  None         Visual Fields (Counting fingers)       Left Right    Full Full         Extraocular Movement       Right Left    Full Full         Neuro/Psych     Oriented x3: Yes   Mood/Affect: Normal         Dilation     Both eyes: 1.0% Mydriacyl, 2.5% Phenylephrine @ 9:06 AM           Slit Lamp and Fundus Exam     External Exam       Right Left   External Normal Normal         Slit Lamp Exam       Right Left   Lids/Lashes Normal Normal   Conjunctiva/Sclera White and quiet White and quiet   Cornea Clear Clear   Anterior Chamber Deep and quiet Deep and quiet   Iris Round and reactive Round and reactive   Lens Centered Posterior chamber intraocular lens, 2+ Posterior capsular opacification Posterior chamber intraocular lens   Anterior Vitreous Normal Normal         Fundus Exam       Right Left   Posterior Vitreous Vitrectomized Vitrectomized   Disc Normal Normal   C/D Ratio 0.2 0.1   Macula Microaneurysms, no exudates, no macular thickening clinical, Moderate clinically significant macular edema no exudates, no macular thickening, no clinically significant macular edema, Microaneurysms   Vessels PDR-quiet PDR-quiet   Periphery Good PRP, a attached Good PRP, a attached            IMAGING AND PROCEDURES  Imaging and Procedures for 08/30/20  OCT, Retina - OU - Both Eyes       Right Eye Quality was good. Scan locations included subfoveal. Central Foveal Thickness: 379. Progression has been stable. Findings include cystoid macular edema, abnormal foveal contour.   Left Eye Quality was good. Scan locations included subfoveal. Central Foveal Thickness: 363. Progression has been stable. Findings include normal foveal contour.   Notes Possible role of mild sleep apnea?  CME localized to the temporal aspect of the macula, watershed area of the macular perfusion.  This could in fact be macular telangiectasis, recent diagnosis of mild sleep  apnea.  Region of CNVM MAD, CSME on the temporal aspect the macula now worse likely secondary to superimposed hypertensive retinopathy that is currently uncontrolled due to medication allergy systemically.  Left eye is entirely normal and no active CME each eye with quiet PDR.             ASSESSMENT/PLAN:  Diabetic macular edema of right eye with proliferative retinopathy associated with type 2 diabetes mellitus ([place])  The nature of diabetic macular edema was discussed with the patient. Treatment options were outlined including medical therapy, laser & vitrectomy. The use  of injectable medications reviewed, including Avastin, Lucentis, and Eylea. Periodic injections into the eye are likely to resolve diabetic macular edema (swelling in the center of vision). Initially, injections are delivered are delivered every 4-6 weeks, and the interval extended as the condition improves. On average, 8-9 injections the first year, and 5 in year 2. Improvement in the condition most often improves on medical therapy. Occasional use of focal laser is also recommended for residual macular edema (swelling). Excellent control of blood glucose and blood pressure are encouraged under the care of a primary physician or endocrinologist. Similarly, attempts to maintain serum cholesterol, low density lipoproteins, and high-density lipoproteins in a favorable range were recommended.  Good acuity Minor center involvement, yet likely made worse by recent superimposed hypertensive retinopathy not controlled currently.  Will await control of hypertensive retinopathy so as to avoid the use of antivegF individually.  Could also 1-Day consider use of Ozurdex  Stable treated proliferative diabetic retinopathy of left eye determined by examination associated with type 2 diabetes mellitus (HCC) Stable quiet PDR OS      ICD-10-CM   1. Diabetic macular edema of right eye with proliferative retinopathy associated with type 2 diabetes  mellitus (HCC)  E11.3511 OCT, Retina - OU - Both Eyes    2. Type 2 macular telangiectasis, bilateral  H35.073 OCT, Retina - OU - Both Eyes    3. Stable treated proliferative diabetic retinopathy of left eye determined by examination associated with type 2 diabetes mellitus ([place])  [id number] OCT, Retina - OU - Both Eyes      1.  OD slightly worse and involved CSME, likely secondary to superimposed hypertensive retinopathy from very severe systemic hypertension not controllable currently due to systemic medication allergies of unknown origin 2.  We will monitor closely the right eye and if CME center involved CSME continues will use intravitreal Ozurdex for safety  3.  Ophthalmic Meds Ordered this visit:  No orders of the defined types were placed in this encounter.      Return in about 8 weeks (around 10/25/2020) for DILATE OU, OCT.  There are no Patient Instructions on file for this visit.   Explained the diagnoses, plan, and follow up with the patient and they expressed understanding.  Patient expressed understanding of the importance of proper follow up care.   Clent Demark Donterrius Santucci M.D. Diseases & Surgery of the Retina and Vitreous Retina & Diabetic [place] 08/30/20     Abbreviations: M myopia (nearsighted); A astigmatism; H hyperopia (farsighted); P presbyopia; Mrx spectacle prescription;  CTL contact lenses; OD right eye; OS left eye; OU both eyes  XT exotropia; ET esotropia; PEK punctate epithelial keratitis; PEE punctate epithelial erosions; DES dry eye syndrome; MGD meibomian gland dysfunction; ATs artificial tears; PFAT's preservative free artificial tears; [place] nuclear sclerotic cataract; PSC posterior subcapsular cataract; ERM epi-retinal membrane; PVD posterior vitreous detachment; RD retinal detachment; DM diabetes mellitus; DR diabetic retinopathy; NPDR non-proliferative diabetic retinopathy; PDR proliferative diabetic retinopathy; CSME clinically significant macular edema;  DME diabetic macular edema; dbh dot blot hemorrhages; CWS cotton wool spot; POAG primary open angle glaucoma; C/D cup-to-disc ratio; HVF humphrey visual field; GVF goldmann visual field; OCT optical coherence tomography; IOP intraocular pressure; BRVO Branch retinal vein occlusion; CRVO central retinal vein occlusion; CRAO central retinal artery occlusion; BRAO branch retinal artery occlusion; RT retinal tear; SB scleral buckle; PPV pars plana vitrectomy; VH Vitreous hemorrhage; PRP panretinal laser photocoagulation; IVK intravitreal kenalog; VMT vitreomacular traction; MH Macular hole;  NVD neovascularization of  the disc; NVE neovascularization elsewhere; AREDS age related eye disease study; ARMD age related macular degeneration; POAG primary open angle glaucoma; EBMD epithelial/anterior basement membrane dystrophy; ACIOL anterior chamber intraocular lens; IOL intraocular lens; PCIOL posterior chamber intraocular lens; Phaco/IOL phacoemulsification with intraocular lens placement; [place] photorefractive keratectomy; LASIK laser assisted in situ keratomileusis; HTN hypertension; DM diabetes mellitus; COPD chronic obstructive pulmonary disease

## 2020-08-30 NOTE — Assessment & Plan Note (Signed)
Stable quiet PDR OS

## 2020-08-30 NOTE — Assessment & Plan Note (Signed)
The nature of diabetic macular edema was discussed with the patient. Treatment options were outlined including medical therapy, laser & vitrectomy. The use of injectable medications reviewed, including Avastin, Lucentis, and Eylea. Periodic injections into the eye are likely to resolve diabetic macular edema (swelling in the center of vision). Initially, injections are delivered are delivered every 4-6 weeks, and the interval extended as the condition improves. On average, 8-9 injections the first year, and 5 in year 2. Improvement in the condition most often improves on medical therapy. Occasional use of focal laser is also recommended for residual macular edema (swelling). Excellent control of blood glucose and blood pressure are encouraged under the care of a primary physician or endocrinologist. Similarly, attempts to maintain serum cholesterol, low density lipoproteins, and high-density lipoproteins in a favorable range were recommended.  Good acuity Minor center involvement, yet likely made worse by recent superimposed hypertensive retinopathy not controlled currently.  Will await control of hypertensive retinopathy so as to avoid the use of antivegF individually.  Could also 1-Day consider use of Ozurdex

## 2020-08-31 ENCOUNTER — Ambulatory Visit: Payer: Federal, State, Local not specified - PPO | Admitting: Family

## 2020-09-01 DIAGNOSIS — G4733 Obstructive sleep apnea (adult) (pediatric): Secondary | ICD-10-CM | POA: Diagnosis not present

## 2020-09-03 ENCOUNTER — Telehealth: Payer: Self-pay

## 2020-09-03 MED ORDER — TIZANIDINE HCL 2 MG PO TABS: 2.0000 mg | ORAL_TABLET | Freq: Every day | ORAL | 2 refills | Status: DC

## 2020-09-03 NOTE — Addendum Note (Signed)
Addended by: Julius Bowels on: 09/03/2020 06:39 PM   Modules accepted: Orders

## 2020-09-03 NOTE — Progress Notes (Signed)
Can you please try to contact this patient again with the message as above? Thank you

## 2020-09-03 NOTE — Telephone Encounter (Signed)
Refill request

## 2020-09-04 ENCOUNTER — Ambulatory Visit (INDEPENDENT_AMBULATORY_CARE_PROVIDER_SITE_OTHER): Payer: Federal, State, Local not specified - PPO | Admitting: Neurology

## 2020-09-04 VITALS — BP 140/73 | HR 65 | Ht 66.0 in | Wt 256.0 lb

## 2020-09-04 DIAGNOSIS — Z6838 Body mass index (BMI) 38.0-38.9, adult: Secondary | ICD-10-CM | POA: Diagnosis not present

## 2020-09-04 DIAGNOSIS — Z794 Long term (current) use of insulin: Secondary | ICD-10-CM | POA: Diagnosis not present

## 2020-09-04 DIAGNOSIS — R442 Other hallucinations: Secondary | ICD-10-CM

## 2020-09-04 DIAGNOSIS — I639 Cerebral infarction, unspecified: Secondary | ICD-10-CM | POA: Diagnosis not present

## 2020-09-04 DIAGNOSIS — G4733 Obstructive sleep apnea (adult) (pediatric): Secondary | ICD-10-CM | POA: Diagnosis not present

## 2020-09-04 DIAGNOSIS — E1140 Type 2 diabetes mellitus with diabetic neuropathy, unspecified: Secondary | ICD-10-CM | POA: Diagnosis not present

## 2020-09-04 DIAGNOSIS — E113511 Type 2 diabetes mellitus with proliferative diabetic retinopathy with macular edema, right eye: Secondary | ICD-10-CM | POA: Diagnosis not present

## 2020-09-04 DIAGNOSIS — G478 Other sleep disorders: Secondary | ICD-10-CM

## 2020-09-04 DIAGNOSIS — I10 Essential (primary) hypertension: Secondary | ICD-10-CM | POA: Diagnosis not present

## 2020-09-04 DIAGNOSIS — G4710 Hypersomnia, unspecified: Secondary | ICD-10-CM

## 2020-09-04 DIAGNOSIS — Z9989 Dependence on other enabling machines and devices: Secondary | ICD-10-CM

## 2020-09-04 DIAGNOSIS — E7800 Pure hypercholesterolemia, unspecified: Secondary | ICD-10-CM | POA: Diagnosis not present

## 2020-09-04 DIAGNOSIS — E1169 Type 2 diabetes mellitus with other specified complication: Secondary | ICD-10-CM | POA: Diagnosis not present

## 2020-09-04 DIAGNOSIS — E559 Vitamin D deficiency, unspecified: Secondary | ICD-10-CM | POA: Diagnosis not present

## 2020-09-04 NOTE — Progress Notes (Signed)
SLEEP MEDICINE CLINIC    Provider:  Larey Seat, MD  Primary Care Physician:  Tina [name], [address] [place] [address]     Referring Provider: Dr Tina Ro, MD         Chief Complaint according to patient   Patient presents with:     New Patient (Initial Visit)           HISTORY OF PRESENT ILLNESS:  Tina [name] is a 50 y.o. year old 24 or [place] American female  patient seen here in a Rv  09/04/2020 . Chief concern according to patient : " I am snacking at night and can't lose weight" - "I am not a sleep walker, but I have vivid dreams , lucid dreams. "   So I had the pleasure of seeing her today in a revisit on 04 September 2020 the patient had undergone a sleep study by home sleep test which indicated a very mild sleep apnea not nearly enough to explain the level of hypersomnia she reports.  A total sleep time of 6 hours and 18 minutes has been recorded and her AHI overall was 8.2.  She does have mild to moderate snoring.  There was a higher AHI on the left side than when she slept on her right and in supine higher than nonsupine.  Her study follow through with the recommendation to start an auto titration CPAP and see if that will help with her sleepiness she is using this machine now for the last 60month she received a 3B medical I could connect machine which is not our usual supplier due to supply chain issues we have had to venture out to other companies.  She has over the last 30 days use the machine 29 out of 30 days and on average she is using it 5 hours.  70% compliance by hourly rate.  Mean pressure is 6.4 average pressure is 8.1 cmH2O she does not have major high leak time residual AHI is down to 0.7 of which there are very few central apneas.  She reports that she sometimes just finds the mask next to her instead of on her face.  She reports that she is less sleepy when she uses CPAP so there has been some effect and today's Epworth  sleepiness scale was endorsed at 13 out of 24 points and her fatigue severity scale was not filled.  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 I would lke to test her properly by MSLT-  So we would have to have weaned her off for at least 14 days of bupropion, duloxetine, gabapentin, tizanidine and tramadol and possibly clonidine that is very difficult to do.   I have the pleasure of seeing Tina MSANI LOISEAUon 09-12-2019 again, she was last seen by Dr SLeonie Man,MD, on 02-11-2016 ( no longer active patient ) and  has a past medical history of Allergy, Anemia, Anxiety, Back pain, chronic, Blood transfusion, Cerebral thrombosis with cerebral infarction ([facility] (JUNE 2011), Constipation, Depression, Diabetes mellitus, Diabetic neuropathy ([place], Edema, lower extremity, Endometriosis, Fatty liver, GERD (gastroesophageal reflux disease), H/O eye surgery, Headache(784.0), Hernia, incisional, RLQ, s/p lap repair Sep 2013 (09/02/2011), History of vertigo (03/22/2018), migraines (10/19/2011), Hypertension, Leg pain, right, Multiple food allergies, Panic disorder without agoraphobia, Rash, Restless leg syndrome, Right rotator cuff tear, SBO ([place] bowel obstruction) ([facility] [place] (06/03/[place]), Shortness of breath, Spastic hemiplegia affecting dominant side ([facility],  Ventral hernia.  The patient had the  first sleep study in the year 2017, the patient was at the time referred from Dr. [name] of [facility], nurse practitioner to be evaluated for excessive daytime fatigue and sleepiness.  She had a previous sleep study in October 23, 2009 at the time she was clinically depressed following her stroke.  She had main sleep interruptions from kicking and twitching at night.  She endorsed a fatigue score at 63 out of 63 points.  Her AHI index in 2017 was 3.0 she did have frequent periodic limb movements but there was only a moderate degree of arousals from these.   She did not have hypoxemia, there was no reason to initiate  CPAP.  We discussed weight loss strategies and to avoid caffeine-containing beverages and chocolate and to review medications and substance use for anything that aggravates restless legs or periodic limb movements at night.  Dr. [name] followed up with the patient the last time in 2018 and he saw her for stroke along the long-term effect tension type headaches she had no significant bruising or bleeding but stayed on a baby aspirin a day.  Hemoglobin A1c at the time was elevated at 8.7.  She had gouty arthritis giving her significant discomfort.  At the time she did not have strokelike symptoms.  She had a left pontine infarct back in June 2011.  Healthy weight and wellness Dr. Leafy [name] saw her on 20 July 2019 stated that the patient had uncontrolled hypertension yet falls asleep in daytime including while driving.  This degree of hypersomnia needs to be evaluated she has been on vitamin D supplementation after a deficiency was found she is treated for depression with emotional eating and nocturnal eating.  She would consider herself a stress eater.   She snores. No nocturia.  She is at risk for heart disease due to her diabetes and hypertension history her BMI was 38 her blood pressure was 153/86 during that visit, creatinine was 1.09 triglycerides were highly elevated 323, hemoglobin A1c as of March was 10.1 hemoglobin and hematocrit were 11.3 and 35 ferritin was low at 35.      Family medical /sleep history: No other family member on CPAP with OSA, mother is suffering from  insomnia, no sleep walkers.    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 She lives in a household with husband, 3 sons an her nephew ( 68).  The patient currently works in the [employer] . Pets are present. One dog.  Tobacco use- none .   ETOH use; none ,  Caffeine intake in form of Coffee( 4/ week) Soda( */ Tea (/) or energy drinks. Regular exercise in form of Yoga ,  walking      Sleep habits are as follows: The patient's dinner time is between 6.30 PM. The patient goes to bed at variable times- she doses off on the living room and transfer to bed at 3 AM and continues to sleep for 2.5  hours,wakes  At 3AM each night- The preferred sleep position is while on her couch is supine and in her bed she may sleep prone or on her side, with the support of one pillow. Dreams are reportedly frequent/vividly.  5.30  AM is the usual rise time. The patient wakes up spontaneously.  She reports not feeling refreshed or restored in AM, with symptoms such as dry mouth,  and residual fatigue.  There are no [name] headaches unless she sleeps in which  does not happen on workdays of course.  She does crave naps and she struggles with daytime sleepiness but if she does not have the external timetable of work to follow she may stay in bed and nap on and off for many hours.   Review of Systems: Out of a complete 14 system review, the patient complains of only the following symptoms, and all other reviewed systems are negative.:  Fatigue, sleepiness , snoring, fragmented sleep, Insomnia , poor sleep and an irresistible urge to go to sleep even while in church even while in the midst of other activities operating machinery.  Sleep, and poorly controlled diabetes and HTN-    How likely are you to doze in the following situations: 0 = not likely, 1 = slight chance, 2 = moderate chance, 3 = high chance   Sitting and Reading? Watching Television? Sitting inactive in a public place (theater or meeting)? As a passenger in a car for an hour without a break? Lying down in the afternoon when circumstances permit? Sitting and talking to someone? Sitting quietly after lunch without alcohol? In a car, while stopped for a few minutes in traffic?   Total = 19/ 24 points   FSS endorsed at i33/ 63 points.   Social History   Socioeconomic History   Marital status: Married    Spouse name:  Annie Main   Number of children: 3   Years of education: BA degree   Highest education level: Not on file  Occupational History   Occupation: stay at home mom    Employer: UNEMPLOYED  Tobacco Use   Smoking status: Never   Smokeless tobacco: Never  Vaping Use   Vaping Use: Never used  Substance and Sexual Activity   Alcohol use: No    Alcohol/week: 0.0 standard drinks   Drug use: No   Sexual activity: Yes    Birth control/protection: None  Other Topics Concern   Not on file  Social History Narrative   Patient is married with 2 children.   Patient is right handed.   Patient has her  BA degree.   Patient drinks 1 cup daily.   Social Determinants of Health   Financial Resource Strain: Not on file  Food Insecurity: Not on file  Transportation Needs: Not on file  Physical Activity: Not on file  Stress: Not on file  Social Connections: Not on file    Family History  Problem Relation Age of Onset   Diabetes Father    Kidney disease Father    Depression Father    Drug abuse Father    Allergic rhinitis Mother    Eczema Mother    Urticaria Mother    Depression Mother    Anxiety disorder Mother    Bipolar disorder Mother    Alcoholism Mother    Drug abuse Mother    Eating disorder Mother    Diabetes Maternal Grandmother    Hyperlipidemia Paternal Grandmother    Stroke Paternal Grandmother    Eczema Sister    Urticaria Sister    Colon cancer Paternal Uncle    Other Neg Hx    Angioedema Neg Hx    Asthma Neg Hx    Colon polyps Neg Hx    Esophageal cancer Neg Hx    Rectal cancer Neg Hx    Stomach cancer Neg Hx     Past Medical History:  Diagnosis Date   Allergy    Anemia    DURING MENSES--HAS HEAVY BLEEDING WITH PERODS  Angioedema 08/13/2020   Anxiety    Back pain, chronic    "ongoing"   Blood transfusion    IN 2012  AFTER C -SECTION   Cerebral thrombosis with cerebral infarction [facility]) JUNE 2011   RIGHT SIDED WEAKNESS ( ARM AND LEG ) AND SPASMS-remains with  slight weakness and vertigo.   Constipation    Depression    Diabetes mellitus    Diabetic neuropathy (HCC)    BOTH FEET --COMES AND GOES   Edema, lower extremity    Endometriosis    Fatty liver    GERD (gastroesophageal reflux disease)    with pregnancy   H/O eye surgery    Headache(784.0)    MIGRAINES--NOT REALLY HEADACHE-MORE LIKE PRESSURE SENSATION IN HEAD-FEELS DIZZIY AND  FAINT AS THE PRESSURE RESOLVES   Hernia, incisional, RLQ, s/p lap repair Sep 2013 09/02/2011   History of vertigo 03/22/2018   Hx of migraines 10/19/2011   Hypertension    Leg pain, right    "like bad Charley horse"   Multiple food allergies    Panic disorder without agoraphobia    Rash    HANDS, ARMS --STATES HX OF RASH EVER SINCE CHILDBIRTH/PREGNANCY.  STATES THE RASH OFTEN OCCURS WHEN SHE IS REALLY STRESSED."goes and comes-presently left ring finger"   Restless leg syndrome    DIAGNOSED BY SLEEP STUDY - PT TOLD SHE DID NOT HAVE SLEEP APNEA   Right rotator cuff tear    PAIN IN RIGHT SHOULDER   SBO (small bowel obstruction) ([place]) 06/03/2012   Shortness of breath    Spastic hemiplegia affecting dominant side (HCC)    Stomach problems    Stroke (HCC)    Ventral hernia    RIGHT LOWER QUADRANT-CAUSING SOME PAIN   Weakness of right side of body     Past Surgical History:  Procedure Laterality Date   APPLICATION OF WOUND VAC N/A 05/25/2015   Procedure: APPLICATION OF WOUND VAC;  Surgeon: Michael Boston, MD;  Location: [facility];  Service: General;  Laterality: N/A;   CESAREAN SECTION  2012   COLONOSCOPY     DIAGNOSTIC LAPAROSCOPY     ESOPHAGOGASTRODUODENOSCOPY N/A 06/03/2012   Procedure: ESOPHAGOGASTRODUODENOSCOPY (EGD);  Surgeon: [name] Craver, MD;  Location: [facility] ENDOSCOPY;  Service: Endoscopy;  Laterality: N/A;   EXCISION MASS ABDOMINAL N/A 05/25/2015   Procedure: ABDOMINAL WALL EXPLORATION EXCISION OF SEROMA REMOVAL OF REDUNDANT SKIN ;  Surgeon: Michael Boston, MD;  Location: [facility];  Service: General;  Laterality:  N/A;   EYE SURGERY     Eye laser for vessel hemorrhaging   fybroid removal     HERNIA REPAIR  10/03/11   ventral hernia repair   INSERTION OF MESH N/A 02/04/2013   Procedure: INSERTION OF MESH;  Surgeon: Adin Hector, MD;  Location: [facility];  Service: General;  Laterality: N/A;   UMBILICAL HERNIA REPAIR N/A 02/04/2013   Procedure: LAPAROSCOPIC ventral wall hernia repair LAPAROSCOPIC LYSIS OF ADHESIONS laparoscopic exploration of abdomen ;  Surgeon: Adin Hector, MD;  Location: [facility];  Service: General;  Laterality: N/A;   UPPER GASTROINTESTINAL ENDOSCOPY     URETER REVISION     Bilateral "twisted"   UTERINE FIBROID SURGERY     2 SURGERIES FOR FIBROIDS   VENTRAL HERNIA REPAIR  10/03/2011   Procedure: LAPAROSCOPIC VENTRAL HERNIA;  Surgeon: Adin Hector, MD;  Location: [facility];  Service: General;  Laterality: N/A;     Current Outpatient Medications on File Prior to Visit  Medication Sig Dispense  Refill   albuterol (VENTOLIN HFA) 108 (90 Base) MCG/ACT inhaler Inhale 2 puffs into the lungs every 4 (four) hours as needed for wheezing or shortness of breath. 18 g 1   allopurinol (ZYLOPRIM) 100 MG tablet Take 100 mg by mouth at bedtime.     aspirin EC 81 MG tablet Take 81 mg by mouth at bedtime. Swallow whole.     atorvastatin (LIPITOR) 10 MG tablet Take 1 tablet (10 mg total) by mouth daily. (Patient taking differently: Take 10 mg by mouth at bedtime.) 30 tablet 0   carvedilol (COREG) 25 MG tablet Take 25 mg by mouth 2 (two) times daily.     cetirizine (ZYRTEC) 10 MG tablet SMARTSIG:1 Tablet(s) By Mouth 1-2 Times Daily     cloNIDine (CATAPRES) 0.2 MG tablet Take 0.4 mg by mouth 2 (two) times daily.     Continuous Blood Gluc Receiver (FREESTYLE LIBRE 2 READER) DEVI daily. as directed     Continuous Blood Gluc Sensor (FREESTYLE LIBRE 2 SENSOR) MISC Inject 1 Device into the skin every 14 (fourteen) days.     DULoxetine (CYMBALTA) 20 MG capsule Take 20-40 mg by mouth See admin instructions.  Take 20 mg by mouth in the [name] and 40 mg at bedtime     EPINEPHrine (AUVI-Q) 0.3 mg/0.3 mL IJ SOAJ injection Inject 0.3 mg into the muscle as needed for anaphylaxis. 1 each 1   famotidine (PEPCID) 20 MG tablet Take 1 tablet (20 mg total) by mouth 2 (two) times daily. 60 tablet 5   furosemide (LASIX) 40 MG tablet Take 40 mg by mouth in the [name].     gabapentin (NEURONTIN) 300 MG capsule TAKE 2 CAPSULES BY MOUTH THREE TIMES DAILY (Patient taking differently: Take 300-600 mg by mouth 2 (two) times daily. 1 capsule in the [name] and 2 capsule at bedtime as needed) 180 capsule 2   insulin aspart (NOVOLOG) 100 UNIT/ML injection Inject 5-15 Units into the skin See admin instructions. Inject 5-15 units into the skin three times a day with meals, PER SLIDING SCALE     Insulin Glargine (BASAGLAR KWIKPEN) 100 UNIT/ML Inject 22 Units into the skin at bedtime. (Patient taking differently: Inject 18 Units into the skin at bedtime.)     omeprazole (PRILOSEC) 40 MG capsule Take 1 capsule by mouth once daily 90 capsule 0   Semaglutide (OZEMPIC, 0.25 OR 0.5 MG/DOSE, [place]) Inject 0.5 mg into the skin every Saturday.     tiZANidine (ZANAFLEX) 2 MG tablet Take 1 tablet (2 mg total) by mouth at bedtime. 30 tablet 2   traMADol (ULTRAM) 50 MG tablet Take 1 tablet by mouth twice daily (Patient taking differently: Take 50 mg by mouth as needed.) 60 tablet 4   Vitamin D, Ergocalciferol, (DRISDOL) 1.25 MG (50000 UNIT) CAPS capsule Take 1 capsule (50,000 Units total) by mouth every 7 (seven) days. (Patient taking differently: Take 50,000 Units by mouth every Friday.) 4 capsule 0   [DISCONTINUED] diphenhydrAMINE (BENADRYL) 25 MG tablet Take 1 tablet (25 mg total) by mouth every 8 (eight) hours as needed for up to 2 days for itching.     No current facility-administered medications on file prior to visit.    Allergies  Allergen Reactions   Amlodipine Benzoate Swelling and Other (See Comments)    Leg swelling   Contrast  Media [Iodinated Diagnostic Agents] Shortness Of Breath and Other (See Comments)    Difficulty breathing   Iodine Anaphylaxis   Iohexol Hives, Nausea And Vomiting, Swelling and  Other (See Comments)     Desc: Magnevist-gadolinium-difficulty breathing, throat swelling    Midazolam Hcl Anaphylaxis    Difficulty breathing   Other Shortness Of Breath, Itching and Other (See Comments)    Patient is allergic to all nuts except peanuts, which are NOT "nuts"- they are legumes   Shellfish Allergy Anaphylaxis   Shellfish-Derived Products Anaphylaxis   Valsartan Swelling   Metformin And Related Diarrhea, Nausea And Vomiting and Other (See Comments)    Dehydration, also   [place] [Dapagliflozin] Nausea Only and Other (See Comments)    UTI, headaches and muscle aches, also   Hydralazine Other (See Comments)    Reaction not recalled   Saxagliptin Hives, Nausea And Vomiting and Other (See Comments)    ONGLYZA- dehydration, also   Spironolactone Other (See Comments)    Elevated potassium- Severe hyperkalemia   Avandia [Rosiglitazone Maleate] Hives and Other (See Comments)   Geodon [Ziprasidone] Other (See Comments)    Reaction not recalled   Kiwi Extract Itching and Swelling   Latex Itching    Physical exam:  Today's Vitals   09/04/20 1436  BP: 140/73  Pulse: 65  Weight: 256 lb (116.1 kg)  Height: '5\' 6"'  (1.676 m)   Body mass index is 41.32 kg/m.   Wt Readings from Last 3 Encounters:  09/04/20 256 lb (116.1 kg)  08/16/20 258 lb 9.6 oz (117.3 kg)  08/14/20 242 lb 8.1 oz (110 kg)     Ht Readings from Last 3 Encounters:  09/04/20 '5\' 6"'  (1.676 m)  08/16/20 '5\' 6"'  (1.676 m)  08/14/20 '5\' 6"'  (1.676 m)      General: The patient is awake, alert and appears not in acute distress. The patient is well groomed. Head: Normocephalic, atraumatic. Neck is supple. Mallampati 3  neck circumference:16 inches . Nasal airflow patent.  Retrognathia is  seen.   Cardiovascular:  Regular rate and  cardiac rhythm by pulse,  without distended neck veins. Respiratory: Lungs are clear to auscultation.  Skin:  Without evidence of ankle edema, or rash. Trunk: The patient's posture is erect.   Neurologic exam : The patient is awake and alert, oriented to place and time.   Memory subjective described as intact.  Attention span & concentration ability appears normal.  Speech is fluent,  without  dysarthria, dysphonia or aphasia.  Mood and affect are appropriate.   Cranial nerves: no loss of smell or taste reported - less taste but not a loss. No covid infection. Fully vaccinated.   Pupils are equal and briskly reactive to light. Funduscopic exam def.erreed  Extraocular movements in vertical and horizontal planes were intact and without nystagmus. No Diplopia. Visual fields by finger perimetry are intact. Hearing was intact to soft voice and finger rubbing.    Facial sensation intact to fine touch.  Facial motor strength is symmetric and tongue and uvula move midline.  Neck ROM : rotation, tilt and flexion extension were normal for age and shoulder shrug was symmetrical.    Motor exam:  Symmetric bulk and ROM.  right hemiparesis by report.  Normal tone without cog- wheeling, right sided elevated tone over biceps.  Right sided reduced  grip strength .   Sensory:  she is now on gabapentin.  Proprioception tested in the upper extremities was normal.   Coordination: Rapid alternating movements in the fingers/hands were of normal speed.  The Finger-to-nose maneuver was intact without evidence of ataxia, dysmetria or tremor.   Gait and station: Patient could rise unassisted from  a seated position, walked without assistive device.  Stance is of normal width/ base and the patient turned with 3steps.  Toe and heel walk were deferred.  Deep tendon reflexes: in the  upper and lower extremities are symmetric and intact.  Babinski response was deferred.      After spending a total time of 30  minutes face to face and additional time for physical and neurologic examination, review of laboratory studies,  personal review of imaging studies, reports and results of other testing and review of referral information / records as far as provided in visit, I have established the following assessments:  1) Mrs. Roehrs describes sleep paralysis attacks, the irresistible urge to fall asleep in daytime she can not help it, she has stated that she has vivid dreams that sometimes are hard to differentiate from reality and that she has vivid dreams throughout the night sometimes waking her.  She did not endorse cataplectic symptoms.  Marland Kitchen  OSA was mild but her recently started CPAP therapy has shown benefit, she also is less sleepy on Cymbalta and prn gabapentin.   #2 evaluate her for possible narcolepsy.  HLA narcolepsy panel was positive !  Her stroke affected the left brain hemisphere and left her with some mild residual right-sided symptoms, it is not usually the location that would be associated with hypersomnia.  That can lead post stroke depression which also can lead to a blunted affect and less energy overall.  I decided against an MSLT at this time because the patient would be weaning off too many medications that are important for her daily functioning, her mood, her anxiety or depression and her pain tolerance.  So I have a suspicion that she may have narcolepsy that right now she is doing okay with a lunar CPAP machine therapy without necessarily needing a stimulant during the day.   Epworth sleepiness score was endorsed at 13 points. Pre CPAP ESS was 19 points !!!    My Plan is to proceed with: I would like to thank Dr Dennard Nip ,MD and Tina Morning, DO ,  for allowing me to meet with this pleasant patient for a sleep medicine consultation.    I plan to follow up an bring her LUNA machine -RV through our NP within 12 month.   CC: I will share my notes with PCP,  too.  Electronically signed by: Larey Seat, MD 09/04/2020 3:03 PM  [facility] [place] certified by The [employer] of Sleep Medicine and Diplomate of the [employer] of Sleep Medicine. Board certified In Neurology through the [place], Fellow of the [employer] of Neurology. Medical Director of [employer].

## 2020-09-04 NOTE — Patient Instructions (Signed)
Hypersomnia Hypersomnia is a condition in which a person feels very tired during the day even though he or she gets plenty of sleep at night. A person with this condition may take naps during the day and may find it very difficult to wake up from sleep. Hypersomnia may affect a person's ability to think, concentrate,drive, or remember things. What are the causes? The cause of this condition may not be known. Possible causes include: Certain medicines. Sleep disorders, such as narcolepsy and sleep apnea. Injury to the head, brain, or spinal cord. Drug or alcohol use. Gastroesophageal reflux disease (GERD). Tumors. Certain medical conditions, such as depression, diabetes, or an underactive thyroid gland (hypothyroidism). What are the signs or symptoms? The main symptoms of hypersomnia include: Feeling very tired throughout the day, regardless of how much sleep you got the night before. Having trouble waking up. Others may find it difficult to wake you up when you are sleeping. Sleeping for longer and longer periods at a time. Taking naps throughout the day. Other symptoms may include: Feeling restless, anxious, or annoyed. Lacking energy. Having trouble with: Remembering. Speaking. Thinking. Loss of appetite. Seeing, hearing, tasting, smelling, or feeling things that are not real (hallucinations). How is this diagnosed? This condition may be diagnosed based on: Your symptoms and medical history. Your sleeping habits. Your health care provider may ask you to write down your sleeping habits in a daily sleep log, along with any symptoms you have. A series of tests that are done while you sleep (sleep study or polysomnogram). A test that measures how quickly you can fall asleep during the day (daytime nap study or multiple sleep latency test). How is this treated? Treatment can help you manage your condition. Treatment may include: Following a regular sleep routine. Lifestyle changes,  such as changing your eating habits, getting regular exercise, and avoiding alcohol or caffeinated beverages. Taking medicines to make you more alert (stimulants) during the day. Treating any underlying medical causes of hypersomnia. Follow these instructions at home: Sleep routine  Schedule the same bedtime and wake-up time each day. Practice a relaxing bedtime routine. This may include reading, meditation, deep breathing, or taking a warm bath before going to sleep. Get regular exercise each day. Avoid strenuous exercise in the evening hours. Keep your sleep environment at a cooler temperature, darkened, and quiet. Sleep with pillows and a mattress that are comfortable and supportive. Schedule short 20-minute naps for when you feel sleepiest during the day. Talk with your employer or teachers about your hypersomnia. If possible, adjust your schedule so that: You have a regular daytime work schedule. You can take a scheduled nap during the day. You do not have to work or be active at night. Do not eat a heavy meal for a few hours before bedtime. Eat your meals at about the same times every day. Avoid drinking alcohol or caffeinated beverages.  Safety  Do not drive or use heavy machinery if you are sleepy. Ask your health care provider if it is safe for you to drive. Wear a life jacket when swimming or spending time near water.  General instructions Take supplements and over-the-counter and prescription medicines only as told by your health care provider. Keep a sleep log that will help your doctor manage your condition. This may include information about: What time you go to bed each night. How often you wake up at night. How many hours you sleep at night. How often and for how long you nap during the   day. Any observations from others, such as leg movements during sleep, sleep walking, or snoring. Keep all follow-up visits as told by your health care provider. This is  important. Contact a health care provider if: You have new symptoms. Your symptoms get worse. Get help right away if: You have serious thoughts about hurting yourself or someone else. If you ever feel like you may hurt yourself or others, or have thoughts about taking your own life, get help right away. You can go to your nearest emergency department or call: Your local emergency services (911 in the [place] A suicide crisis helpline, such as the National Suicide Prevention Lifeline at 1-800-273-8255. This is open 24 hours a day. Summary Hypersomnia refers to a condition in which you feel very tired during the day even though you get plenty of sleep at night. A person with this condition may take naps during the day and may find it very difficult to wake up from sleep. Hypersomnia may affect a person's ability to think, concentrate, drive, or remember things. Treatment, such as following a regular sleep routine and making some lifestyle changes, can help you manage your condition. This information is not intended to replace advice given to you by your health care provider. Make sure you discuss any questions you have with your healthcare provider. Document Revised: 11/10/2019 Document Reviewed: 11/10/2019 Elsevier Patient Education  2022 Elsevier Inc.  

## 2020-09-05 ENCOUNTER — Emergency Department (HOSPITAL_COMMUNITY): Payer: Federal, State, Local not specified - PPO

## 2020-09-05 ENCOUNTER — Encounter (HOSPITAL_COMMUNITY): Payer: Self-pay | Admitting: Emergency Medicine

## 2020-09-05 ENCOUNTER — Inpatient Hospital Stay (HOSPITAL_COMMUNITY)
Admission: EM | Admit: 2020-09-05 | Discharge: 2020-09-06 | DRG: 247 | Disposition: A | Discharge status: Home / Self Care | Payer: Federal, State, Local not specified - PPO | Attending: Cardiology | Admitting: Cardiology

## 2020-09-05 ENCOUNTER — Encounter (HOSPITAL_COMMUNITY)
Admission: EM | Disposition: A | Discharge status: Home / Self Care | Payer: Self-pay | Source: Home / Self Care | Attending: Cardiology

## 2020-09-05 DIAGNOSIS — Z91041 Radiographic dye allergy status: Secondary | ICD-10-CM | POA: Diagnosis not present

## 2020-09-05 DIAGNOSIS — Z794 Long term (current) use of insulin: Secondary | ICD-10-CM | POA: Diagnosis not present

## 2020-09-05 DIAGNOSIS — I10 Essential (primary) hypertension: Secondary | ICD-10-CM | POA: Diagnosis not present

## 2020-09-05 DIAGNOSIS — E6609 Other obesity due to excess calories: Secondary | ICD-10-CM | POA: Diagnosis not present

## 2020-09-05 DIAGNOSIS — Z955 Presence of coronary angioplasty implant and graft: Secondary | ICD-10-CM

## 2020-09-05 DIAGNOSIS — E11319 Type 2 diabetes mellitus with unspecified diabetic retinopathy without macular edema: Secondary | ICD-10-CM | POA: Diagnosis present

## 2020-09-05 DIAGNOSIS — Z823 Family history of stroke: Secondary | ICD-10-CM

## 2020-09-05 DIAGNOSIS — K760 Fatty (change of) liver, not elsewhere classified: Secondary | ICD-10-CM | POA: Diagnosis present

## 2020-09-05 DIAGNOSIS — E559 Vitamin D deficiency, unspecified: Secondary | ICD-10-CM | POA: Diagnosis present

## 2020-09-05 DIAGNOSIS — R778 Other specified abnormalities of plasma proteins: Secondary | ICD-10-CM

## 2020-09-05 DIAGNOSIS — I69351 Hemiplegia and hemiparesis following cerebral infarction affecting right dominant side: Secondary | ICD-10-CM

## 2020-09-05 DIAGNOSIS — Z7982 Long term (current) use of aspirin: Secondary | ICD-10-CM | POA: Diagnosis not present

## 2020-09-05 DIAGNOSIS — I214 Non-ST elevation (NSTEMI) myocardial infarction: Secondary | ICD-10-CM | POA: Diagnosis not present

## 2020-09-05 DIAGNOSIS — K219 Gastro-esophageal reflux disease without esophagitis: Secondary | ICD-10-CM | POA: Diagnosis present

## 2020-09-05 DIAGNOSIS — Z79899 Other long term (current) drug therapy: Secondary | ICD-10-CM

## 2020-09-05 DIAGNOSIS — E1140 Type 2 diabetes mellitus with diabetic neuropathy, unspecified: Secondary | ICD-10-CM | POA: Diagnosis present

## 2020-09-05 DIAGNOSIS — I2510 Atherosclerotic heart disease of native coronary artery without angina pectoris: Secondary | ICD-10-CM | POA: Diagnosis not present

## 2020-09-05 DIAGNOSIS — Z800 Family history of malignant neoplasm of digestive organs: Secondary | ICD-10-CM

## 2020-09-05 DIAGNOSIS — Z833 Family history of diabetes mellitus: Secondary | ICD-10-CM

## 2020-09-05 DIAGNOSIS — I517 Cardiomegaly: Secondary | ICD-10-CM | POA: Diagnosis not present

## 2020-09-05 DIAGNOSIS — Z6841 Body Mass Index (BMI) 40.0 and over, adult: Secondary | ICD-10-CM

## 2020-09-05 DIAGNOSIS — M79601 Pain in right arm: Secondary | ICD-10-CM | POA: Diagnosis not present

## 2020-09-05 DIAGNOSIS — R0602 Shortness of breath: Secondary | ICD-10-CM | POA: Diagnosis not present

## 2020-09-05 DIAGNOSIS — Z20822 Contact with and (suspected) exposure to covid-19: Secondary | ICD-10-CM | POA: Diagnosis present

## 2020-09-05 DIAGNOSIS — R079 Chest pain, unspecified: Secondary | ICD-10-CM | POA: Diagnosis not present

## 2020-09-05 DIAGNOSIS — R072 Precordial pain: Secondary | ICD-10-CM | POA: Diagnosis not present

## 2020-09-05 DIAGNOSIS — Z91013 Allergy to seafood: Secondary | ICD-10-CM

## 2020-09-05 DIAGNOSIS — E1165 Type 2 diabetes mellitus with hyperglycemia: Secondary | ICD-10-CM | POA: Diagnosis not present

## 2020-09-05 HISTORY — PX: INTRAVASCULAR ULTRASOUND/IVUS: CATH118244

## 2020-09-05 HISTORY — PX: CORONARY STENT INTERVENTION: CATH118234

## 2020-09-05 HISTORY — PX: CORONARY ULTRASOUND/IVUS: CATH118244

## 2020-09-05 HISTORY — PX: LEFT HEART CATH AND CORONARY ANGIOGRAPHY: CATH118249

## 2020-09-05 LAB — POCT I-STAT 7, (LYTES, BLD GAS, ICA,H+H)
Acid-base deficit: 3 mmol/L — ABNORMAL HIGH (ref 0.0–2.0)
Bicarbonate: 21.6 mmol/L (ref 20.0–28.0)
Calcium, Ion: 1.16 mmol/L (ref 1.15–1.40)
HCT: 37 % (ref 36.0–46.0)
Hemoglobin: 12.6 g/dL (ref 12.0–15.0)
O2 Saturation: 96 %
Potassium: 3.8 mmol/L (ref 3.5–5.1)
Sodium: 126 mmol/L — ABNORMAL LOW (ref 135–145)
TCO2: 23 mmol/L (ref 22–32)
pCO2 arterial: 36.2 mmHg (ref 32.0–48.0)
pH, Arterial: 7.384 (ref 7.350–7.450)
pO2, Arterial: 83 mmHg (ref 83.0–108.0)

## 2020-09-05 LAB — CBC
HCT: 38.5 % (ref 36.0–46.0)
HCT: 38.9 % (ref 36.0–46.0)
Hemoglobin: 12.4 g/dL (ref 12.0–15.0)
Hemoglobin: 13 g/dL (ref 12.0–15.0)
MCH: 27.3 pg (ref 26.0–34.0)
MCH: 27.5 pg (ref 26.0–34.0)
MCHC: 32.2 g/dL (ref 30.0–36.0)
MCHC: 33.4 g/dL (ref 30.0–36.0)
MCV: 84.8 fL (ref 80.0–100.0)
MCV: 82.4 fL (ref 80.0–100.0)
Platelets: 297 10*3/uL (ref 150–400)
Platelets: 295 10*3/uL (ref 150–400)
RBC: 4.54 MIL/uL (ref 3.87–5.11)
RBC: 4.72 MIL/uL (ref 3.87–5.11)
RDW: 13.3 % (ref 11.5–15.5)
RDW: 13.3 % (ref 11.5–15.5)
WBC: 6.9 10*3/uL (ref 4.0–10.5)
WBC: 11.7 10*3/uL — ABNORMAL HIGH (ref 4.0–10.5)
nRBC: 0 % (ref 0.0–0.2)
nRBC: 0 % (ref 0.0–0.2)

## 2020-09-05 LAB — BASIC METABOLIC PANEL
Anion gap: 7 (ref 5–15)
BUN: 11 mg/dL (ref 6–20)
CO2: 26 mmol/L (ref 22–32)
Calcium: 8.7 mg/dL — ABNORMAL LOW (ref 8.9–10.3)
Chloride: 104 mmol/L (ref 98–111)
Creatinine, Ser: 1.23 mg/dL — ABNORMAL HIGH (ref 0.44–1.00)
GFR, Estimated: 53 mL/min — ABNORMAL LOW (ref >60)
Glucose, Bld: 238 mg/dL — ABNORMAL HIGH (ref 70–99)
Potassium: 4.5 mmol/L (ref 3.5–5.1)
Sodium: 137 mmol/L (ref 135–145)

## 2020-09-05 LAB — RESP PANEL BY RT-PCR (FLU A&B, COVID) ARPGX2
Influenza A by PCR: NEGATIVE
Influenza B by PCR: NEGATIVE
SARS Coronavirus 2 by RT PCR: NEGATIVE

## 2020-09-05 LAB — PROTIME-INR
INR: 0.9 (ref 0.8–1.2)
Prothrombin Time: 12.4 seconds (ref 11.4–15.2)

## 2020-09-05 LAB — BRAIN NATRIURETIC PEPTIDE: B Natriuretic Peptide: 80 pg/mL (ref 0.0–100.0)

## 2020-09-05 LAB — CREATININE, SERUM
Creatinine, Ser: 1.17 mg/dL — ABNORMAL HIGH (ref 0.44–1.00)
GFR, Estimated: 56 mL/min — ABNORMAL LOW (ref >60)

## 2020-09-05 LAB — GLUCOSE, CAPILLARY
Glucose-Capillary: 300 mg/dL — ABNORMAL HIGH (ref 70–99)
Glucose-Capillary: 383 mg/dL — ABNORMAL HIGH (ref 70–99)

## 2020-09-05 LAB — TROPONIN I (HIGH SENSITIVITY)
Troponin I (High Sensitivity): 4982 ng/L (ref <18)
Troponin I (High Sensitivity): 4287 ng/L (ref <18)

## 2020-09-05 LAB — APTT: aPTT: 38 seconds — ABNORMAL HIGH (ref 24–36)

## 2020-09-05 LAB — POCT ACTIVATED CLOTTING TIME
Activated Clotting Time: 422 seconds
Activated Clotting Time: 364 seconds

## 2020-09-05 SURGERY — LEFT HEART CATH AND CORONARY ANGIOGRAPHY
Anesthesia: LOCAL

## 2020-09-05 MED ORDER — CLOPIDOGREL BISULFATE 75 MG PO TABS: 75.0000 mg | ORAL_TABLET | Freq: Every day | ORAL | 1 refills | Status: DC

## 2020-09-05 MED ORDER — METHYLPREDNISOLONE SODIUM SUCC 125 MG IJ SOLR
125.0000 mg | Freq: Once | INTRAMUSCULAR | Status: AC
  Administered 2020-09-05: 125 mg via INTRAVENOUS

## 2020-09-05 MED ORDER — CLOPIDOGREL BISULFATE 75 MG PO TABS
75.0000 mg | ORAL_TABLET | Freq: Every day | ORAL | Status: DC
  Administered 2020-09-06: 75 mg via ORAL
  Filled 2020-09-05: qty 1

## 2020-09-05 MED ORDER — HEPARIN BOLUS VIA INFUSION
4000.0000 [IU] | Freq: Once | INTRAVENOUS | Status: AC
  Administered 2020-09-05: 4000 [IU] via INTRAVENOUS
  Filled 2020-09-05: qty 4000

## 2020-09-05 MED ORDER — ASPIRIN 81 MG PO CHEW: 81.0000 mg | CHEWABLE_TABLET | ORAL | Status: DC

## 2020-09-05 MED ORDER — FENTANYL CITRATE (PF) 100 MCG/2ML IJ SOLN
INTRAMUSCULAR | Status: DC | PRN
  Administered 2020-09-05 (×2): 25 ug via INTRAVENOUS

## 2020-09-05 MED ORDER — HYDRALAZINE HCL 20 MG/ML IJ SOLN: 10.0000 mg | INTRAMUSCULAR | Status: DC | PRN

## 2020-09-05 MED ORDER — HYDROCORTISONE NA SUCCINATE PF 250 MG IJ SOLR: 200.0000 mg | Freq: Once | INTRAMUSCULAR | Status: DC

## 2020-09-05 MED ORDER — SODIUM CHLORIDE 0.9% FLUSH: 3.0000 mL | Freq: Two times a day (BID) | INTRAVENOUS | Status: DC

## 2020-09-05 MED ORDER — TIZANIDINE HCL 2 MG PO TABS
2.0000 mg | ORAL_TABLET | Freq: Every day | ORAL | Status: DC
  Administered 2020-09-05: 2 mg via ORAL
  Filled 2020-09-05 (×2): qty 1

## 2020-09-05 MED ORDER — GABAPENTIN 300 MG PO CAPS
600.0000 mg | ORAL_CAPSULE | Freq: Three times a day (TID) | ORAL | Status: DC
  Administered 2020-09-05 – 2020-09-06 (×2): 600 mg via ORAL
  Filled 2020-09-05 (×2): qty 2

## 2020-09-05 MED ORDER — LABETALOL HCL 5 MG/ML IV SOLN: 10.0000 mg | Freq: Four times a day (QID) | INTRAVENOUS | Status: DC | PRN

## 2020-09-05 MED ORDER — ATORVASTATIN CALCIUM 40 MG PO TABS
40.0000 mg | ORAL_TABLET | Freq: Every day | ORAL | Status: DC
  Administered 2020-09-05 – 2020-09-06 (×2): 40 mg via ORAL
  Filled 2020-09-05 (×2): qty 1

## 2020-09-05 MED ORDER — HYDROCORTISONE NA SUCCINATE PF 100 MG IJ SOLR
100.0000 mg | Freq: Once | INTRAMUSCULAR | Status: DC
  Filled 2020-09-05: qty 2

## 2020-09-05 MED ORDER — INSULIN ASPART 100 UNIT/ML IJ SOLN
0.0000 [IU] | Freq: Three times a day (TID) | INTRAMUSCULAR | Status: DC
  Administered 2020-09-06 (×2): 20 [IU] via SUBCUTANEOUS

## 2020-09-05 MED ORDER — HYDRALAZINE HCL 20 MG/ML IJ SOLN
INTRAMUSCULAR | Status: AC
  Filled 2020-09-05: qty 1

## 2020-09-05 MED ORDER — CLOPIDOGREL BISULFATE 300 MG PO TABS
ORAL_TABLET | ORAL | Status: AC
  Filled 2020-09-05: qty 2

## 2020-09-05 MED ORDER — DIPHENHYDRAMINE HCL 50 MG/ML IJ SOLN
50.0000 mg | Freq: Once | INTRAMUSCULAR | Status: AC
  Administered 2020-09-05: 50 mg via INTRAVENOUS

## 2020-09-05 MED ORDER — DULOXETINE HCL 20 MG PO CPEP
40.0000 mg | ORAL_CAPSULE | Freq: Every day | ORAL | Status: DC
  Administered 2020-09-05: 40 mg via ORAL
  Filled 2020-09-05 (×2): qty 2

## 2020-09-05 MED ORDER — HEPARIN SODIUM (PORCINE) 1000 UNIT/ML IJ SOLN
INTRAMUSCULAR | Status: AC
  Filled 2020-09-05: qty 1

## 2020-09-05 MED ORDER — FENTANYL CITRATE (PF) 100 MCG/2ML IJ SOLN
INTRAMUSCULAR | Status: AC
  Filled 2020-09-05: qty 2

## 2020-09-05 MED ORDER — CARVEDILOL 25 MG PO TABS
25.0000 mg | ORAL_TABLET | Freq: Two times a day (BID) | ORAL | Status: DC
  Administered 2020-09-05 – 2020-09-06 (×2): 25 mg via ORAL
  Filled 2020-09-05 (×2): qty 1

## 2020-09-05 MED ORDER — NITROGLYCERIN IN D5W 200-5 MCG/ML-% IV SOLN
2.0000 ug/min | INTRAVENOUS | Status: DC
  Administered 2020-09-05: 5 ug/min via INTRAVENOUS
  Administered 2020-09-05: 30 ug/min via INTRAVENOUS
  Filled 2020-09-05: qty 250

## 2020-09-05 MED ORDER — HEPARIN SODIUM (PORCINE) 1000 UNIT/ML IJ SOLN
INTRAMUSCULAR | Status: DC | PRN
  Administered 2020-09-05 (×2): 6000 [IU] via INTRAVENOUS

## 2020-09-05 MED ORDER — IOHEXOL 350 MG/ML SOLN
INTRAVENOUS | Status: DC | PRN
  Administered 2020-09-05: 115 mL

## 2020-09-05 MED ORDER — DIPHENHYDRAMINE HCL 50 MG/ML IJ SOLN
50.0000 mg | Freq: Once | INTRAMUSCULAR | Status: DC
  Filled 2020-09-05: qty 1

## 2020-09-05 MED ORDER — SODIUM CHLORIDE 0.9 % IV SOLN: 250.0000 mL | INTRAVENOUS | Status: DC | PRN

## 2020-09-05 MED ORDER — PANTOPRAZOLE SODIUM 40 MG PO TBEC
40.0000 mg | DELAYED_RELEASE_TABLET | Freq: Every day | ORAL | Status: DC
  Administered 2020-09-06: 40 mg via ORAL
  Filled 2020-09-05: qty 1

## 2020-09-05 MED ORDER — HYDRALAZINE HCL 20 MG/ML IJ SOLN
INTRAMUSCULAR | Status: DC | PRN
Start: 2020-09-05 — End: 2020-09-05
  Administered 2020-09-05 (×2): 10 mg via INTRAVENOUS

## 2020-09-05 MED ORDER — HEPARIN (PORCINE) IN NACL 1000-0.9 UT/500ML-% IV SOLN
INTRAVENOUS | Status: DC | PRN
  Administered 2020-09-05 (×2): 500 mL

## 2020-09-05 MED ORDER — ALLOPURINOL 100 MG PO TABS
100.0000 mg | ORAL_TABLET | Freq: Every day | ORAL | Status: DC
  Administered 2020-09-05: 100 mg via ORAL
  Filled 2020-09-05: qty 1

## 2020-09-05 MED ORDER — TRAMADOL HCL 50 MG PO TABS
50.0000 mg | ORAL_TABLET | Freq: Every evening | ORAL | Status: DC | PRN
  Administered 2020-09-06: 50 mg via ORAL
  Filled 2020-09-05: qty 1

## 2020-09-05 MED ORDER — INSULIN ASPART 100 UNIT/ML ~~LOC~~ SOLN: 5.0000 [IU] | SUBCUTANEOUS | Status: DC

## 2020-09-05 MED ORDER — VERAPAMIL HCL 2.5 MG/ML IV SOLN
INTRAVENOUS | Status: AC
  Filled 2020-09-05: qty 2

## 2020-09-05 MED ORDER — HEPARIN SODIUM (PORCINE) 5000 UNIT/ML IJ SOLN
5000.0000 [IU] | Freq: Three times a day (TID) | INTRAMUSCULAR | Status: DC
  Administered 2020-09-06: 5000 [IU] via SUBCUTANEOUS
  Filled 2020-09-05: qty 1

## 2020-09-05 MED ORDER — CLOPIDOGREL BISULFATE 300 MG PO TABS
ORAL_TABLET | ORAL | Status: DC | PRN
  Administered 2020-09-05: 600 mg via ORAL

## 2020-09-05 MED ORDER — DIPHENHYDRAMINE HCL 25 MG PO CAPS: 50.0000 mg | ORAL_CAPSULE | Freq: Once | ORAL | Status: DC

## 2020-09-05 MED ORDER — HEPARIN (PORCINE) 25000 UT/250ML-% IV SOLN
1150.0000 [IU]/h | INTRAVENOUS | Status: DC
  Administered 2020-09-05: 1150 [IU]/h via INTRAVENOUS
  Filled 2020-09-05: qty 250

## 2020-09-05 MED ORDER — ONDANSETRON HCL 4 MG/2ML IJ SOLN
INTRAMUSCULAR | Status: AC
  Filled 2020-09-05: qty 2

## 2020-09-05 MED ORDER — NITROGLYCERIN 1 MG/10 ML FOR IR/CATH LAB
INTRA_ARTERIAL | Status: AC
  Filled 2020-09-05: qty 10

## 2020-09-05 MED ORDER — ACETAMINOPHEN 325 MG PO TABS: 650.0000 mg | ORAL_TABLET | ORAL | Status: DC | PRN

## 2020-09-05 MED ORDER — DULOXETINE HCL 20 MG PO CPEP
20.0000 mg | ORAL_CAPSULE | Freq: Every day | ORAL | Status: DC
  Administered 2020-09-06: 20 mg via ORAL
  Filled 2020-09-05: qty 1

## 2020-09-05 MED ORDER — ASPIRIN EC 81 MG PO TBEC: 81.0000 mg | DELAYED_RELEASE_TABLET | Freq: Every day | ORAL | Status: DC

## 2020-09-05 MED ORDER — NITROGLYCERIN 0.4 MG SL SUBL
0.4000 mg | SUBLINGUAL_TABLET | SUBLINGUAL | Status: DC | PRN
  Administered 2020-09-05 (×2): 0.4 mg via SUBLINGUAL
  Filled 2020-09-05: qty 1

## 2020-09-05 MED ORDER — ONDANSETRON HCL 4 MG/2ML IJ SOLN
4.0000 mg | Freq: Four times a day (QID) | INTRAMUSCULAR | Status: DC | PRN
  Administered 2020-09-05: 4 mg via INTRAVENOUS

## 2020-09-05 MED ORDER — ALBUTEROL SULFATE (2.5 MG/3ML) 0.083% IN NEBU: 3.0000 mL | INHALATION_SOLUTION | RESPIRATORY_TRACT | Status: DC | PRN

## 2020-09-05 MED ORDER — DIPHENHYDRAMINE HCL 25 MG PO CAPS: 50.0000 mg | ORAL_CAPSULE | Freq: Once | ORAL | Status: AC

## 2020-09-05 MED ORDER — LIDOCAINE HCL (PF) 1 % IJ SOLN
INTRAMUSCULAR | Status: AC
  Filled 2020-09-05: qty 30

## 2020-09-05 MED ORDER — METHYLPREDNISOLONE SODIUM SUCC 125 MG IJ SOLR
INTRAMUSCULAR | Status: AC
  Administered 2020-09-05: 125 mg via INTRAVENOUS
  Filled 2020-09-05: qty 2

## 2020-09-05 MED ORDER — SODIUM CHLORIDE 0.9 % IV SOLN: INTRAVENOUS | Status: AC

## 2020-09-05 MED ORDER — METHYLPREDNISOLONE SODIUM SUCC 40 MG IJ SOLR: 40.0000 mg | INTRAMUSCULAR | Status: DC

## 2020-09-05 MED ORDER — VERAPAMIL HCL 2.5 MG/ML IV SOLN
INTRAVENOUS | Status: DC | PRN
  Administered 2020-09-05: 10 mL via INTRA_ARTERIAL

## 2020-09-05 MED ORDER — SODIUM CHLORIDE 0.9% FLUSH: 3.0000 mL | INTRAVENOUS | Status: DC | PRN

## 2020-09-05 MED ORDER — LABETALOL HCL 5 MG/ML IV SOLN: 10.0000 mg | INTRAVENOUS | Status: DC | PRN

## 2020-09-05 MED ORDER — INSULIN GLARGINE-YFGN 100 UNIT/ML ~~LOC~~ SOLN
20.0000 [IU] | Freq: Every day | SUBCUTANEOUS | Status: DC
  Administered 2020-09-05: 20 [IU] via SUBCUTANEOUS
  Filled 2020-09-05 (×2): qty 0.2

## 2020-09-05 MED ORDER — HEPARIN (PORCINE) IN NACL 1000-0.9 UT/500ML-% IV SOLN
INTRAVENOUS | Status: AC
  Filled 2020-09-05: qty 1000

## 2020-09-05 MED ORDER — FUROSEMIDE 40 MG PO TABS
40.0000 mg | ORAL_TABLET | Freq: Every day | ORAL | Status: DC
  Administered 2020-09-06: 40 mg via ORAL
  Filled 2020-09-05: qty 1

## 2020-09-05 MED ORDER — CLONIDINE HCL 0.2 MG PO TABS
0.2000 mg | ORAL_TABLET | Freq: Two times a day (BID) | ORAL | Status: DC
  Administered 2020-09-05 – 2020-09-06 (×2): 0.2 mg via ORAL
  Filled 2020-09-05 (×2): qty 1

## 2020-09-05 SURGICAL SUPPLY — 23 items
BALLN SAPPHIRE 2.0X12 (BALLOONS) ×2
BALLN SAPPHIRE ~~LOC~~ 2.25X8 (BALLOONS) ×1 IMPLANT
BALLN ~~LOC~~ EUPHORA RX 2.5X8 (BALLOONS) ×2
BALLOON SAPPHIRE 2.0X12 (BALLOONS) IMPLANT
BALLOON ~~LOC~~ EUPHORA RX 2.5X8 (BALLOONS) IMPLANT
CATH 5FR JL3.5 JR4 ANG PIG MP (CATHETERS) ×1 IMPLANT
CATH LAUNCHER 5F EBU3.0 (CATHETERS) IMPLANT
CATH LAUNCHER 6FR EBU3.5 (CATHETERS) ×1 IMPLANT
CATH OPTICROSS HD (CATHETERS) ×1 IMPLANT
CATHETER LAUNCHER 5F EBU3.0 (CATHETERS) ×2
DEVICE RAD COMP TR BAND LRG (VASCULAR PRODUCTS) ×2 IMPLANT
GLIDESHEATH SLEND A-KIT 6F 22G (SHEATH) ×1 IMPLANT
GUIDEWIRE INQWIRE 1.5J.035X260 (WIRE) IMPLANT
INQWIRE 1.5J .035X260CM (WIRE) ×2
KIT ENCORE 26 ADVANTAGE (KITS) ×1 IMPLANT
KIT HEART LEFT (KITS) ×2 IMPLANT
KIT HEMO VALVE WATCHDOG (MISCELLANEOUS) ×1 IMPLANT
PACK CARDIAC CATHETERIZATION (CUSTOM PROCEDURE TRAY) ×2 IMPLANT
SLED PULL BACK IVUS (MISCELLANEOUS) ×1 IMPLANT
STENT ONYX FRONTIER 2.25X12 (Permanent Stent) ×2 IMPLANT
TRANSDUCER W/STOPCOCK (MISCELLANEOUS) ×2 IMPLANT
TUBING CIL FLEX 10 FLL-RA (TUBING) ×2 IMPLANT
WIRE ASAHI PROWATER 180CM (WIRE) ×1 IMPLANT

## 2020-09-05 NOTE — ED Triage Notes (Signed)
Pt here from home with c/o htn and cp , also with sob and n/v , was seen for same on the 15 th

## 2020-09-05 NOTE — ED Provider Notes (Signed)
[facility] EMERGENCY DEPARTMENT Provider Note   CSN: [id number] Arrival date & time: 09/05/20  1102     History No chief complaint on file.   Tina [name] is a 50 y.o. female.  HPI Patient with a history of HTN, DM, and cerebral infarct coming in with chest pain. She says that it started last night and got worse this morning, her right arm started hurting. She says that she initially felt like she slept wrong but then the pain didn't go away. The pain is radiating into her neck, right arm, and behind her ear. She says it hurts every time her heart beats right now. She took an extra clonidine this morning because her blood pressure was high and took an aspirin because of her chest pain.      Past Medical History:  Diagnosis Date   Allergy    Anemia    DURING MENSES--HAS HEAVY BLEEDING WITH PERODS   Angioedema 08/13/2020   Anxiety    Back pain, chronic    "ongoing"   Blood transfusion    IN 2012  AFTER C -SECTION   Cerebral thrombosis with cerebral infarction [facility]) JUNE 2011   RIGHT SIDED WEAKNESS ( ARM AND LEG ) AND SPASMS-remains with slight weakness and vertigo.   Constipation    Depression    Diabetes mellitus    Diabetic neuropathy (HCC)    BOTH FEET --COMES AND GOES   Edema, lower extremity    Endometriosis    Fatty liver    GERD (gastroesophageal reflux disease)    with pregnancy   H/O eye surgery    Headache(784.0)    MIGRAINES--NOT REALLY HEADACHE-MORE LIKE PRESSURE SENSATION IN HEAD-FEELS DIZZIY AND  FAINT AS THE PRESSURE RESOLVES   Hernia, incisional, RLQ, s/p lap repair Sep 2013 09/02/2011   History of vertigo 03/22/2018   Hx of migraines 10/19/2011   Hypertension    Leg pain, right    "like bad Charley horse"   Multiple food allergies    Panic disorder without agoraphobia    Rash    HANDS, ARMS --STATES HX OF RASH EVER SINCE CHILDBIRTH/PREGNANCY.  STATES THE RASH OFTEN OCCURS WHEN SHE IS REALLY STRESSED."goes and comes-presently left  ring finger"   Restless leg syndrome    DIAGNOSED BY SLEEP STUDY - PT TOLD SHE DID NOT HAVE SLEEP APNEA   Right rotator cuff tear    PAIN IN RIGHT SHOULDER   SBO (small bowel obstruction) ([place]) 06/03/2012   Shortness of breath    Spastic hemiplegia affecting dominant side (HCC)    Stomach problems    Stroke (HCC)    Ventral hernia    RIGHT LOWER QUADRANT-CAUSING SOME PAIN   Weakness of right side of body     Patient Active Problem List   Diagnosis Date Noted   Hypersomnia, persistent 09/04/2020   OSA on CPAP 09/04/2020   Shortness of breath 08/18/2020   Allergy to alpha-gal 08/18/2020   Seasonal and perennial allergic rhinitis 08/18/2020   Allergy with anaphylaxis due to food 08/18/2020   Pruritus 08/18/2020   Pollen-food allergy 08/18/2020   Allergic reaction 08/13/2020   Angioedema 08/13/2020   Type 2 macular telangiectasis, bilateral 01/03/2020   Excessive daytime sleepiness 09/12/2019   Sleep paralysis 09/12/2019   Hypnopompic hallucination 09/12/2019   Abnormal dreams 09/12/2019   Diabetic macular edema of right eye with proliferative retinopathy associated with type 2 diabetes mellitus ([place]) 07/04/2019   Stable treated proliferative diabetic retinopathy of left eye  determined by examination associated with type 2 diabetes mellitus ([place]) 07/04/2019   Long-term insulin use ([place]) 04/07/2019   Depression 04/13/2018   History of vertigo 03/22/2018   Laboratory examination 03/22/2018   Chronic diastolic (congestive) heart failure ([place]) 03/22/2018   Other hyperlipidemia 02/15/2018   Vitamin D deficiency 12/31/2017   Class 2 severe obesity with serious comorbidity and body mass index (BMI) of 39.0 to 39.9 in adult ([place]) 11/18/2017   Gait disturbance, post-stroke 04/23/2017   Strain of gluteus medius 04/23/2017   OSA (obstructive sleep apnea) 10/17/2015   Periodic limb movement disorder (PLMD) 10/17/2015   Essential hypertension 05/26/2015   Diabetes mellitus ([place])  05/26/2015   Abdominal wall mass of right lower quadrant 05/25/2015   Hemiparesis affecting right side as late effect of stroke ([place]) 11/09/2014   Abdominal wall seroma    Sepsis due to undetermined organism ([place]) 03/06/2014   Nausea vomiting and diarrhea 03/06/2014   Sepsis ([place]) 03/06/2014   Tension headache 09/01/2013   Nausea with vomiting ?Gastroparesis? 03/21/2013   Spastic hemiplegia affecting dominant side ([place]) 02/16/2013   Recurrent ventral incisional hernia s/p closure/repair w mesh 02/04/2013 01/26/2013   Constipation, chronic 01/26/2013   Abdominal pain, chronic, right lower quadrant 06/03/2012   History of CVA (cerebrovascular accident) 06/03/2012   HTN (hypertension) 06/03/2012   Diabetes mellitus type II, uncontrolled ([place]) 10/16/2011   Obesity (BMI 30-39.9) 09/02/2011   Rotator cuff tear 08/25/2011   Sciatica 06/10/2011   Paresthesias in right hand 06/10/2011   Lumbago 05/09/2011   Paresthesias 05/09/2011    Past Surgical History:  Procedure Laterality Date   APPLICATION OF WOUND VAC N/A 05/25/2015   Procedure: APPLICATION OF WOUND VAC;  Surgeon: Michael Boston, MD;  Location: [facility] ORS;  Service: General;  Laterality: N/A;   CESAREAN SECTION  2012   COLONOSCOPY     DIAGNOSTIC LAPAROSCOPY     ESOPHAGOGASTRODUODENOSCOPY N/A 06/03/2012   Procedure: ESOPHAGOGASTRODUODENOSCOPY (EGD);  Surgeon: [name] Craver, MD;  Location: [facility] ENDOSCOPY;  Service: Endoscopy;  Laterality: N/A;   EXCISION MASS ABDOMINAL N/A 05/25/2015   Procedure: ABDOMINAL WALL EXPLORATION EXCISION OF SEROMA REMOVAL OF REDUNDANT SKIN ;  Surgeon: Michael Boston, MD;  Location: [facility] ORS;  Service: General;  Laterality: N/A;   EYE SURGERY     Eye laser for vessel hemorrhaging   fybroid removal     HERNIA REPAIR  10/03/11   ventral hernia repair   INSERTION OF MESH N/A 02/04/2013   Procedure: INSERTION OF MESH;  Surgeon: Adin Hector, MD;  Location: [facility] ORS;  Service: General;  Laterality: N/A;   UMBILICAL HERNIA  REPAIR N/A 02/04/2013   Procedure: LAPAROSCOPIC ventral wall hernia repair LAPAROSCOPIC LYSIS OF ADHESIONS laparoscopic exploration of abdomen ;  Surgeon: Adin Hector, MD;  Location: [facility];  Service: General;  Laterality: N/A;   UPPER GASTROINTESTINAL ENDOSCOPY     URETER REVISION     Bilateral "twisted"   UTERINE FIBROID SURGERY     2 SURGERIES FOR FIBROIDS   VENTRAL HERNIA REPAIR  10/03/2011   Procedure: LAPAROSCOPIC VENTRAL HERNIA;  Surgeon: Adin Hector, MD;  Location: [facility] OR[facility];  Service: General;  Laterality: N/A;     OB History     Gravida  2   Para  2   Term  0   Preterm  1   AB  0   Living  1      SAB  0   IAB  0   Ectopic  0  Multiple  0   Live Births              Family History  Problem Relation Age of Onset   Diabetes Father    Kidney disease Father    Depression Father    Drug abuse Father    Allergic rhinitis Mother    Eczema Mother    Urticaria Mother    Depression Mother    Anxiety disorder Mother    Bipolar disorder Mother    Alcoholism Mother    Drug abuse Mother    Eating disorder Mother    Diabetes Maternal Grandmother    Hyperlipidemia Paternal Grandmother    Stroke Paternal Grandmother    Eczema Sister    Urticaria Sister    Colon cancer Paternal Uncle    Other Neg Hx    Angioedema Neg Hx    Asthma Neg Hx    Colon polyps Neg Hx    Esophageal cancer Neg Hx    Rectal cancer Neg Hx    Stomach cancer Neg Hx     Social History   Tobacco Use   Smoking status: Never   Smokeless tobacco: Never  Vaping Use   Vaping Use: Never used  Substance Use Topics   Alcohol use: No    Alcohol/week: 0.0 standard drinks   Drug use: No    Home Medications Prior to Admission medications   Medication Sig Start Date End Date Taking? Authorizing Provider  albuterol (VENTOLIN HFA) 108 (90 Base) MCG/ACT inhaler Inhale 2 puffs into the lungs every 4 (four) hours as needed for wheezing or shortness of breath. 08/16/20   Dara Hoyer, FNP   allopurinol (ZYLOPRIM) 100 MG tablet Take 100 mg by mouth at bedtime. 03/13/14   [provider]  aspirin EC 81 MG tablet Take 81 mg by mouth at bedtime. Swallow whole.    [provider]  atorvastatin (LIPITOR) 10 MG tablet Take 1 tablet (10 mg total) by mouth daily. Patient taking differently: Take 10 mg by mouth at bedtime. 06/23/18   Whitmire, Joneen Boers, FNP  carvedilol (COREG) 25 MG tablet Take 25 mg by mouth 2 (two) times daily. 04/02/20   [provider]  cetirizine (ZYRTEC) 10 MG tablet SMARTSIG:1 Tablet(s) By Mouth 1-2 Times Daily 08/16/20   [provider]  cloNIDine (CATAPRES) 0.2 MG tablet Take 0.4 mg by mouth 2 (two) times daily. 08/13/19   [provider]  Continuous Blood Gluc Receiver (FREESTYLE LIBRE 2 READER) DEVI daily. as directed 04/13/19   [provider]  Continuous Blood Gluc Sensor (FREESTYLE LIBRE 2 SENSOR) MISC Inject 1 Device into the skin every 14 (fourteen) days. 08/23/19   [provider]  DULoxetine (CYMBALTA) 20 MG capsule Take 20-40 mg by mouth See admin instructions. Take 20 mg by mouth in the morning and 40 mg at bedtime    [provider]  EPINEPHrine (AUVI-Q) 0.3 mg/0.3 mL IJ SOAJ injection Inject 0.3 mg into the muscle as needed for anaphylaxis. 08/16/20   Dara Hoyer, FNP  famotidine (PEPCID) 20 MG tablet Take 1 tablet (20 mg total) by mouth 2 (two) times daily. 08/16/20   Dara Hoyer, FNP  furosemide (LASIX) 40 MG tablet Take 40 mg by mouth in the morning.    [provider]  gabapentin (NEURONTIN) 300 MG capsule TAKE 2 CAPSULES BY MOUTH THREE TIMES DAILY Patient taking differently: Take 300-600 mg by mouth 2 (two) times daily. 1 capsule in the morning and 2  capsule at bedtime as needed 08/15/19   [name], MD  insulin aspart (NOVOLOG) 100 UNIT/ML injection Inject 5-15 Units into the skin See admin instructions. Inject 5-15 units into the skin three times a day with meals, PER  SLIDING SCALE    [provider]  Insulin Glargine (BASAGLAR KWIKPEN) 100 UNIT/ML Inject 22 Units into the skin at bedtime. Patient taking differently: Inject 18 Units into the skin at bedtime. 08/15/20   Domenic Polite, MD  omeprazole (PRILOSEC) 40 MG capsule Take 1 capsule by mouth once daily 02/09/19   Armbruster, Carlota Raspberry, MD  Semaglutide (OZEMPIC, 0.25 OR 0.5 MG/DOSE, [place]) Inject 0.5 mg into the skin every Saturday.    [provider]  tiZANidine (ZANAFLEX) 2 MG tablet Take 1 tablet (2 mg total) by mouth at bedtime. 09/03/20   Kirsteins, Luanna Salk, MD  traMADol (ULTRAM) 50 MG tablet Take 1 tablet by mouth twice daily Patient taking differently: Take 50 mg by mouth as needed. 05/01/20   Kirsteins, Luanna Salk, MD  Vitamin D, Ergocalciferol, (DRISDOL) 1.25 MG (50000 UNIT) CAPS capsule Take 1 capsule (50,000 Units total) by mouth every 7 (seven) days. Patient taking differently: Take 50,000 Units by mouth every Friday. 08/10/19   Dennard Nip D, MD  diphenhydrAMINE (BENADRYL) 25 MG tablet Take 1 tablet (25 mg total) by mouth every 8 (eight) hours as needed for up to 2 days for itching. 08/14/20 08/15/20  Domenic Polite, MD    Allergies    Amlodipine benzoate, Contrast media [iodinated diagnostic agents], Iodine, Iohexol, Midazolam hcl, Other, Shellfish allergy, Shellfish-derived products, Valsartan, Metformin and related, Farxiga [dapagliflozin], Hydralazine, Saxagliptin, Spironolactone, Avandia [rosiglitazone maleate], Geodon [ziprasidone], Kiwi extract, and Latex  Review of Systems   Review of Systems  Respiratory:  Positive for chest tightness and shortness of breath.   Cardiovascular:  Positive for chest pain, palpitations and leg swelling.  Musculoskeletal:  Positive for neck pain.   Physical Exam Updated Vital Signs BP (!) 169/86   Pulse 68   Temp 98.7 F (37.1 C) (Oral)   Resp (!) 24   SpO2 100%   Physical Exam Constitutional:      Appearance: She is obese.  HENT:      Head: Normocephalic and atraumatic.  Eyes:     Extraocular Movements: Extraocular movements intact.  Cardiovascular:     Rate and Rhythm: Normal rate and regular rhythm.  Pulmonary:     Effort: Pulmonary effort is normal.     Breath sounds: Normal breath sounds.  Chest:     Chest wall: Tenderness present.  Musculoskeletal:     Cervical back: Normal range of motion.     Right lower leg: 1+ Pitting Edema present.     Left lower leg: 1+ Pitting Edema present.  Neurological:     Mental Status: She is alert. Mental status is at baseline.    ED Results / Procedures / Treatments   Labs (all labs ordered are listed, but only abnormal results are displayed) Labs Reviewed  BASIC METABOLIC PANEL - Abnormal; Notable for the following components:      Result Value   Glucose, Bld 238 (*)    Creatinine, Ser 1.23 (*)    Calcium 8.7 (*)    GFR, Estimated 53 (*)    All other components within normal limits  TROPONIN I (HIGH SENSITIVITY) - Abnormal; Notable for the following components:   Troponin I (High Sensitivity) 4,982 (*)    All other components within normal limits  RESP  PANEL BY RT-PCR (FLU A&B, COVID) ARPGX2  CBC  APTT  PROTIME-INR  HEPARIN LEVEL (UNFRACTIONATED)  TROPONIN I (HIGH SENSITIVITY)    EKG EKG Interpretation  Date/Time:  Wednesday September 05 2020 11:28:14 EDT Ventricular Rate:  73 PR Interval:  160 QRS Duration: 68 QT Interval:  402 QTC Calculation: 442 R Axis:   35 Text Interpretation: Normal sinus rhythm Low voltage QRS Borderline ECG No significant change since last tracing Confirmed by Isla Pence [phone]) on 09/05/2020 2:08:02 PM  Radiology DG Chest 2 View  Result Date: 09/05/2020 CLINICAL DATA:  Chest pain, right arm pain EXAM: CHEST - 2 VIEW COMPARISON:  Chest radiograph 08/27/2020 FINDINGS: The heart is mildly enlarged, unchanged. The mediastinal contours are within normal limits. The lungs are clear, with no focal consolidation or pulmonary edema.  There is no pleural effusion or pneumothorax. There is no acute osseous abnormality. IMPRESSION: Unchanged cardiomegaly. Otherwise, no radiographic evidence of acute cardiopulmonary process. Electronically Signed   By: Valetta Mole M.D.   On: 09/05/2020 12:21    Procedures Procedures   Medications Ordered in ED Medications  nitroGLYCERIN (NITROSTAT) SL tablet 0.4 mg (0.4 mg Sublingual Given 09/05/20 1441)  heparin ADULT infusion 100 units/mL (25000 units/278m) (1,150 Units/hr Intravenous New Bag/Given 09/05/20 1435)  nitroGLYCERIN 50 mg in dextrose 5 % 250 mL (0.2 mg/mL) infusion (has no administration in time range)  hydrocortisone sodium succinate (SOLU-CORTEF) injection 200 mg (has no administration in time range)  diphenhydrAMINE (BENADRYL) capsule 50 mg (has no administration in time range)    Or  diphenhydrAMINE (BENADRYL) injection 50 mg (has no administration in time range)  heparin bolus via infusion 4,000 Units (4,000 Units Intravenous Bolus from Bag 09/05/20 1435)    ED Course  I have reviewed the triage vital signs and the nursing notes.  Pertinent labs & imaging results that were available during my care of the patient were reviewed by me and considered in my medical decision making (see chart for details).    MDM Rules/Calculators/A&P                           Patient complaining of chest pain with elevated troponin to the 4000s. CXR showed no new changes, no suggestion of pulmonary edema. ECG does not suggest a STEMI. Consult placed to cardiology for concern for NSTEMI. They will see the patient and plan for a cath today. Patient started on a heparin drip, nitro drip, and made NPO for cath. Will need admission.  Final Clinical Impression(s) / ED Diagnoses Final diagnoses:  None    Rx / DC Orders ED Discharge Orders     None        DScarlett Presto MD 09/05/20 1459    HIsla Pence MD 09/05/20 1534

## 2020-09-05 NOTE — Progress Notes (Signed)
No pregnancy test performed today.  Patient is having active chest pain with non-STEMI, peak troponin 4900.  Patient is perimenopausal, has not had a menstrual period since June.  She is sexually active, does not use contraception.  We discussed risks and benefits of continuing to wait versus proceeding with coronary angiography and intervention.  Patient would like to proceed.  Understands risks and benefits.   Nigel Mormon, MD Pager: [phone] Office: [phone]

## 2020-09-05 NOTE — Interval H&P Note (Signed)
History and Physical Interval Note:  09/05/2020 4:20 PM  Tina [name]  has presented today for surgery, with the diagnosis of nstemi.  The various methods of treatment have been discussed with the patient and family. After consideration of risks, benefits and other options for treatment, the patient has consented to  Procedure(s): LEFT HEART CATH AND CORONARY ANGIOGRAPHY (N/A) as a surgical intervention.  The patient's history has been reviewed, patient examined, no change in status, stable for surgery.  I have reviewed the patient's chart and labs.  Questions were answered to the patient's satisfaction.    AUC app unavailable ACS   Mahesh Sizemore J Meeya Goldin

## 2020-09-05 NOTE — H&P (Deleted)
HISTORY AND PHYSICAL  Patient ID: Tina [name] MRN: [id number] DOB/AGE: 51-Apr-1972 51 y.o.  Admit date: 09/05/2020 Attending physician: Isla Pence, MD Primary Physician:  [name], DO  Chief complaint: chest pain.   HPI:  Tina [name] is a 50 y.o. female who presents with a chief complaint of "chest pain." Her past medical history and cardiovascular risk factors include:  history of CVA with residual right-sided hemiparesis which is essentially improved and does very minimal residual defect, insulin dependent diabetes mellitus, obesity, hypertension, obesity.  Presents to the hospital today with a chief complaint of chest pain.  Patient states that the chest pain is located over the right anterior precordium, started yesterday, intensity 9 out of 10, pressure/squeezing-like sensation, constant, worse with effort related activities, and at times improves with rest but not always.   Patient states that she has been back and forth for elevated blood pressures.  She is intolerant/allergic to multiple antihypertensive medications contributing to her underlying uncontrolled hypertension.  Patient states that this pain is significantly different than what she has experienced in the past.  Work-up in the ER notes elevated troponins with a first high sensitive troponin 4982 followed by 4287.  ALLERGIES: Allergies  Allergen Reactions   Amlodipine Benzoate Swelling and Other (See Comments)    Leg swelling   Contrast Media [Iodinated Diagnostic Agents] Shortness Of Breath and Other (See Comments)    Difficulty breathing   Iodine Anaphylaxis   Iohexol Hives, Nausea And Vomiting, Swelling and Other (See Comments)     Desc: Magnevist-gadolinium-difficulty breathing, throat swelling    Midazolam Hcl Anaphylaxis    Difficulty breathing   Other Shortness Of Breath, Itching and Other (See Comments)    Patient is allergic to all nuts except peanuts, which are NOT "nuts"- they are  legumes   Shellfish Allergy Anaphylaxis   Shellfish-Derived Products Anaphylaxis   Valsartan Swelling   Metformin And Related Diarrhea, Nausea And Vomiting and Other (See Comments)    Dehydration, also   [place] [Dapagliflozin] Nausea Only and Other (See Comments)    UTI, headaches and muscle aches, also   Hydralazine Other (See Comments)    Reaction not recalled   Saxagliptin Hives, Nausea And Vomiting and Other (See Comments)    ONGLYZA- dehydration, also   Spironolactone Other (See Comments)    Elevated potassium- Severe hyperkalemia   Avandia [Rosiglitazone Maleate] Hives and Other (See Comments)   Geodon [Ziprasidone] Other (See Comments)    Reaction not recalled   Kiwi Extract Itching and Swelling   Latex Itching    PAST MEDICAL HISTORY: Past Medical History:  Diagnosis Date   Allergy    Anemia    DURING MENSES--HAS HEAVY BLEEDING WITH PERODS   Angioedema 08/13/2020   Anxiety    Back pain, chronic    "ongoing"   Blood transfusion    IN 2012  AFTER C -SECTION   Cerebral thrombosis with cerebral infarction [facility]) JUNE 2011   RIGHT SIDED WEAKNESS ( ARM AND LEG ) AND SPASMS-remains with slight weakness and vertigo.   Constipation    Depression    Diabetes mellitus    Diabetic neuropathy (HCC)    BOTH FEET --COMES AND GOES   Edema, lower extremity    Endometriosis    Fatty liver    GERD (gastroesophageal reflux disease)    with pregnancy   H/O eye surgery    Headache(784.0)    MIGRAINES--NOT REALLY HEADACHE-MORE LIKE PRESSURE SENSATION IN HEAD-FEELS DIZZIY AND  FAINT AS THE  PRESSURE RESOLVES   Hernia, incisional, RLQ, s/p lap repair Sep 2013 09/02/2011   History of vertigo 03/22/2018   Hx of migraines 10/19/2011   Hypertension    Leg pain, right    "like bad Charley horse"   Multiple food allergies    Panic disorder without agoraphobia    Rash    HANDS, ARMS --STATES HX OF RASH EVER SINCE CHILDBIRTH/PREGNANCY.  STATES THE RASH OFTEN OCCURS WHEN SHE IS REALLY  STRESSED."goes and comes-presently left ring finger"   Restless leg syndrome    DIAGNOSED BY SLEEP STUDY - PT TOLD SHE DID NOT HAVE SLEEP APNEA   Right rotator cuff tear    PAIN IN RIGHT SHOULDER   SBO (small bowel obstruction) ([place]) 06/03/2012   Shortness of breath    Spastic hemiplegia affecting dominant side (HCC)    Stomach problems    Stroke (HCC)    Ventral hernia    RIGHT LOWER QUADRANT-CAUSING SOME PAIN   Weakness of right side of body     PAST SURGICAL HISTORY: Past Surgical History:  Procedure Laterality Date   APPLICATION OF WOUND VAC N/A 05/25/2015   Procedure: APPLICATION OF WOUND VAC;  Surgeon: Michael Boston, MD;  Location: [facility];  Service: General;  Laterality: N/A;   CESAREAN SECTION  2012   COLONOSCOPY     DIAGNOSTIC LAPAROSCOPY     ESOPHAGOGASTRODUODENOSCOPY N/A 06/03/2012   Procedure: ESOPHAGOGASTRODUODENOSCOPY (EGD);  Surgeon: [name] Craver, MD;  Location: [facility] ENDOSCOPY;  Service: Endoscopy;  Laterality: N/A;   EXCISION MASS ABDOMINAL N/A 05/25/2015   Procedure: ABDOMINAL WALL EXPLORATION EXCISION OF SEROMA REMOVAL OF REDUNDANT SKIN ;  Surgeon: Michael Boston, MD;  Location: [facility];  Service: General;  Laterality: N/A;   EYE SURGERY     Eye laser for vessel hemorrhaging   fybroid removal     HERNIA REPAIR  10/03/11   ventral hernia repair   INSERTION OF MESH N/A 02/04/2013   Procedure: INSERTION OF MESH;  Surgeon: Adin Hector, MD;  Location: [facility];  Service: General;  Laterality: N/A;   UMBILICAL HERNIA REPAIR N/A 02/04/2013   Procedure: LAPAROSCOPIC ventral wall hernia repair LAPAROSCOPIC LYSIS OF ADHESIONS laparoscopic exploration of abdomen ;  Surgeon: Adin Hector, MD;  Location: [facility];  Service: General;  Laterality: N/A;   UPPER GASTROINTESTINAL ENDOSCOPY     URETER REVISION     Bilateral "twisted"   UTERINE FIBROID SURGERY     2 SURGERIES FOR FIBROIDS   VENTRAL HERNIA REPAIR  10/03/2011   Procedure: LAPAROSCOPIC VENTRAL HERNIA;  Surgeon: Adin Hector, MD;  Location: [facility];  Service: General;  Laterality: N/A;    FAMILY HISTORY: The patient family history includes Alcoholism in her mother; Allergic rhinitis in her mother; Anxiety disorder in her mother; Bipolar disorder in her mother; Colon cancer in her paternal uncle; Depression in her father and mother; Diabetes in her father and maternal grandmother; Drug abuse in her father and mother; Eating disorder in her mother; Eczema in her mother and sister; Hyperlipidemia in her paternal grandmother; Kidney disease in her father; Stroke in her paternal grandmother; Urticaria in her mother and sister.   SOCIAL HISTORY:  The patient  reports that she has never smoked. She has never used smokeless tobacco. She reports that she does not drink alcohol and does not use drugs.  MEDICATIONS: Current Outpatient Medications  Medication Instructions   albuterol (VENTOLIN HFA) 108 (90 Base) MCG/ACT inhaler 2 puffs, Inhalation, Every 4 hours PRN  allopurinol (ZYLOPRIM) 100 mg, Oral, Daily at bedtime   aspirin EC 81 mg, Oral, Daily at bedtime, Swallow whole.    atorvastatin (LIPITOR) 10 mg, Oral, Daily   Basaglar KwikPen 22 Units, Subcutaneous, Daily at bedtime   carvedilol (COREG) 25 mg, Oral, 2 times daily   cetirizine (ZYRTEC) 10 MG tablet SMARTSIG:1 Tablet(s) By Mouth 1-2 Times Daily   cloNIDine (CATAPRES) 0.4 mg, Oral, 2 times daily   Continuous Blood Gluc Receiver (FREESTYLE LIBRE 2 READER) DEVI Daily, as directed   Continuous Blood Gluc Sensor (FREESTYLE LIBRE 2 SENSOR) MISC 1 Device, Subcutaneous, Every 14 days   DULoxetine (CYMBALTA) 20-40 mg, Oral, See admin instructions, Take 20 mg by mouth in the morning and 40 mg at bedtime   EPINEPHrine (AUVI-Q) 0.3 mg, Intramuscular, As needed   famotidine (PEPCID) 20 mg, Oral, 2 times daily   furosemide (LASIX) 40 mg, Oral, Every morning   gabapentin (NEURONTIN) 300 MG capsule TAKE 2 CAPSULES BY MOUTH THREE TIMES DAILY   insulin aspart (NOVOLOG)  5-15 Units, Subcutaneous, See admin instructions, Inject 5-15 units into the skin three times a day with meals, PER SLIDING SCALE   omeprazole (PRILOSEC) 40 MG capsule Take 1 capsule by mouth once daily   Semaglutide (OZEMPIC, 0.25 OR 0.5 MG/DOSE, [place]) 0.5 mg, Subcutaneous, Every Sat   tiZANidine (ZANAFLEX) 2 mg, Oral, Daily at bedtime   traMADol (ULTRAM) 50 MG tablet Take 1 tablet by mouth twice daily   Vitamin D (Ergocalciferol) (DRISDOL) 50,000 Units, Oral, Every 7 days     heparin 1,150 Units/hr (09/05/20 1435)   nitroGLYCERIN 20 mcg/min (09/05/20 1549)    REVIEW OF SYSTEMS: Review of Systems  Constitutional: Positive for diaphoresis. Negative for chills and fever.  HENT:  Negative for hoarse voice and nosebleeds.   Eyes:  Negative for discharge, double vision and pain.  Cardiovascular:  Positive for chest pain and dyspnea on exertion. Negative for claudication, leg swelling, near-syncope, orthopnea, palpitations, paroxysmal nocturnal dyspnea and syncope.  Respiratory:  Positive for shortness of breath. Negative for hemoptysis.   Musculoskeletal:  Negative for muscle cramps and myalgias.  Gastrointestinal:  Positive for nausea. Negative for abdominal pain, constipation, diarrhea, hematemesis, hematochezia, melena and vomiting.  Neurological:  Negative for dizziness and light-headedness.  All other systems reviewed and are negative.  PHYSICAL EXAM: Vitals with BMI 09/05/2020 09/05/2020 09/05/2020  Height - - -  Weight - - -  BMI - - -  Systolic [id number] 0000000  Diastolic 86 73 69  Pulse - - -    No intake or output data in the 24 hours ending 09/05/20 1553  Net IO Since Admission: No IO data has been entered for this period [09/05/20 1553] CONSTITUTIONAL: Well-developed and well-nourished. No acute distress.  SKIN: Skin is warm and dry. No rash noted. No cyanosis. No pallor. No jaundice HEAD: Normocephalic and atraumatic.  EYES: No scleral icterus MOUTH/THROAT: Moist oral  membranes.  NECK: No JVD present. No thyromegaly noted. No carotid bruits  LYMPHATIC: No visible cervical adenopathy.  CHEST Normal respiratory effort. No intercostal retractions  LUNGS: Clear to auscultation bilaterally.  No stridor. No wheezes. No rales.  CARDIOVASCULAR: Regular, positive Q000111Q, soft holosystolic murmur heard at the apex, no gallops or rubs. ABDOMINAL: Obese, soft, nontender, nondistended, positive bowel sounds in all 4 quadrants, no apparent ascites.  EXTREMITIES: No peripheral edema, warm to touch, 2+ PT pulses. HEMATOLOGIC: No significant bruising NEUROLOGIC: Oriented to person, place, and time. Nonfocal. Normal muscle tone.  PSYCHIATRIC: Normal mood  and affect. Normal behavior. Cooperative  RADIOLOGY: DG Chest 2 View  Result Date: 09/05/2020 CLINICAL DATA:  Chest pain, right arm pain EXAM: CHEST - 2 VIEW COMPARISON:  Chest radiograph 08/27/2020 FINDINGS: The heart is mildly enlarged, unchanged. The mediastinal contours are within normal limits. The lungs are clear, with no focal consolidation or pulmonary edema. There is no pleural effusion or pneumothorax. There is no acute osseous abnormality. IMPRESSION: Unchanged cardiomegaly. Otherwise, no radiographic evidence of acute cardiopulmonary process. Electronically Signed   By: Valetta Mole M.D.   On: 09/05/2020 12:21    LABORATORY DATA: Lab Results  Component Value Date   WBC 6.9 09/05/2020   HGB 12.4 09/05/2020   HCT 38.5 09/05/2020   MCV 84.8 09/05/2020   PLT 297 09/05/2020    Recent Labs  Lab 09/05/20 1137  NA 137  K 4.5  CL 104  CO2 26  BUN 11  CREATININE 1.23*  CALCIUM 8.7*  GLUCOSE 238*    Lipid Panel  Lab Results  Component Value Date   CHOL 191 08/02/2019   HDL 54 08/02/2019   LDLCALC 102 08/02/2019   TRIG 207 (A) 08/02/2019   CHOLHDL 6.0 10/17/2011    BNP (last 3 results) No results for input(s): BNP in the last 8760 hours.  HEMOGLOBIN A1C Lab Results  Component Value Date    HGBA1C 8.4 (H) 08/13/2020   MPG 194.38 08/13/2020    Cardiac Panel (last 3 results) No results for input(s): CKTOTAL, CKMB, RELINDX in the last 8760 hours.  Invalid input(s): TROPONINHS  Lab Results  Component Value Date   CKTOTAL 59 06/16/2009   CKMB 1.1 06/16/2009     TSH No results for input(s): TSH in the last 8760 hours.    CARDIAC DATABASE: EKG: 09/05/2020 1128: Normal sinus rhythm, 73 bpm, without underlying ischemia or injury pattern.   Echocardiogram 08/21/2017: Left ventricle cavity is normal in size. Mild concentric hypertrophy of the left ventricle. Normal global wall motion. Doppler evidence of grade II (pseudonormal) diastolic dysfunction, elevated LAP. Calculated EF 55%. Mildly restricted aortic valve leaflets with trace aortic valve stenosis. Aortic valve mean gradient of 8 mmHg, Vmax of 2.0 m/s. Calculated aortic valve area by continuity equation is 1.4 cm. No evidence of pulmonary hypertension.   Lexiscan myoview stress test 08/21/2017:  1. Lexiscan stress test was performed. Exercise capacity was not assessed. Resting BP 148/90 mmHg, peak effect BP 168/90 mmHg. Stress symptoms included dizziness, nausea, headache, chest tightness.  2. The overall quality of the study is good. There is no evidence of abnormal lung activity. Stress and rest SPECT images demonstrate homogeneous tracer distribution throughout the myocardium. Gated SPECT imaging reveals normal myocardial thickening and wall motion. The left ventricular ejection fraction was normal calculated as 45%, although visually appears normal.  3. Low risk study.   ABI 08/21/2017: This exam reveals normal perfusion of both the  lower extremity (RABI 1.27 and LABI 1.20 with biphasic waveform).  ABI may be falsely elevated in patients with DM and medial calcinosis.   Heart Catheterization: None  IMPRESSION & RECOMMENDATIONS: Tina [name] is a [name] whose past medical history  and cardiovascular risk factors include: history of CVA with residual right-sided hemiparesis which is essentially improved and does very minimal residual defect, insulin dependent diabetes mellitus, obesity, hypertension, obesity..  Non-STEMI: Patient's symptoms are very concerning for cardiac etiology and high sensitive troponins are greater than 4000. EKG shows normal sinus rhythm without underlying injury pattern. Patient is currently  on IV heparin drip. Nitro drip to titrate for blood pressure and chest pain Patient is treated for her contrast allergy. The procedure of left heart catheterization with possible intervention was explained to the patient in detail.  The indication, alternatives, risks and benefits were reviewed.  Complications include but not limited to bleeding, infection, vascular injury, stroke, myocardial infection, arrhythmia, kidney injury requiring temporary or permanent dialysis, radiation-related injury in the case of prolonged fluoroscopy use, emergency cardiac surgery, and death. The patient understands the risks of serious complication is 1-2 in [id number] with diagnostic cardiac cath and 1-2% or less with angioplasty/stenting.  The patient voices understanding and provides verbal feedback and wishes to proceed with coronary angiography with possible PCI. Echocardiogram will be ordered to evaluate for structural heart disease and left ventricular systolic function.   Benign essential hypertension: Not well controlled. Multiple allergies to antihypertensive medications. Currently on clonidine and carvedilol. Started on IV nitro drip.  We will further uptitrate oral medications as tolerated.   Insulin-dependent diabetes mellitus type 2:  Will start home medications.  Consultants: None   Code Status: Full    Family Communication:  No family at bedside.     Disposition Plan: Tentatively home.   CRITICAL CARE Performed by: Rex Kras   Total critical care time: 40  minutes   Critical care time was exclusive of separately billable procedures and treating other patients.   Critical care was necessary to treat or prevent imminent or life-threatening deterioration.   Critical care was time spent personally by me on the following activities: development of treatment plan with patient and/or surrogate as well as nursing, discussions with consultants, evaluation of patient's response to treatment, examination of patient, obtaining history from patient or surrogate, ordering and performing treatments and interventions, ordering and review of laboratory studies, ordering and review of radiographic studies, pulse oximetry and re-evaluation of patient's condition.   Patient's questions and concerns were addressed to her satisfaction. She voices understanding of the instructions provided during this encounter.    This note was created using a voice recognition software as a result there may be grammatical errors inadvertently enclosed that do not reflect the nature of this encounter. Every attempt is made to correct such errors.  Of Note, the original note was done as  a consult and then the patient was going to be admitted to our service so it was carried forward to a H&P so some elements may have been copied and pasted from that document but originally done by me.   Mechele Claude [facility]  Pager: [phone] Office: [phone] 09/05/2020, 3:53 PM

## 2020-09-05 NOTE — H&P (Addendum)
HISTORY AND PHYSICAL  Patient ID: Tina [name] MRN: [id number] DOB/AGE: Jan 01, 1971 50 y.o.  Admit date: 09/05/2020 Attending physician: Isla Pence, MD Primary Physician:  [name], DO  Chief complaint: chest pain  HPI:  Tina [name] is a 50 y.o. female who presents with a chief complaint of "chest pain." Her past medical history and cardiovascular risk factors include: history of CVA with residual right-sided hemiparesis which is essentially improved and does very minimal residual defect, insulin dependent diabetes mellitus, obesity, hypertension, obesity.   Presents to the hospital today with a chief complaint of chest pain.  Patient states that the chest pain is located over the right anterior precordium, started yesterday, intensity 9 out of 10, pressure/squeezing-like sensation, constant, worse with effort related activities, and at times improves with rest but not always.   Patient states that she has been back and forth for elevated blood pressures.  She is intolerant/allergic to multiple antihypertensive medications contributing to her underlying uncontrolled hypertension.  Patient states that this pain is significantly different than what she has experienced in the past.  Work-up in the ER notes elevated troponins with a first high sensitive troponin 4982 followed by 4287.Marland Kitchen  ALLERGIES: Allergies  Allergen Reactions   Amlodipine Benzoate Swelling and Other (See Comments)    Leg swelling   Contrast Media [Iodinated Diagnostic Agents] Shortness Of Breath and Other (See Comments)    Difficulty breathing   Iodine Anaphylaxis   Iohexol Hives, Nausea And Vomiting, Swelling and Other (See Comments)     Desc: Magnevist-gadolinium-difficulty breathing, throat swelling    Midazolam Hcl Anaphylaxis    Difficulty breathing   Other Shortness Of Breath, Itching and Other (See Comments)    Patient is allergic to all nuts except peanuts, which are NOT "nuts"- they are  legumes   Shellfish Allergy Anaphylaxis   Shellfish-Derived Products Anaphylaxis   Valsartan Swelling   Metformin And Related Diarrhea, Nausea And Vomiting and Other (See Comments)    Dehydration, also   [place] [Dapagliflozin] Nausea Only and Other (See Comments)    UTI, headaches and muscle aches, also   Hydralazine Other (See Comments)    Reaction not recalled   Saxagliptin Hives, Nausea And Vomiting and Other (See Comments)    ONGLYZA- dehydration, also   Spironolactone Other (See Comments)    Elevated potassium- Severe hyperkalemia   Avandia [Rosiglitazone Maleate] Hives and Other (See Comments)   Geodon [Ziprasidone] Other (See Comments)    Reaction not recalled   Kiwi Extract Itching and Swelling   Latex Itching    PAST MEDICAL HISTORY: Past Medical History:  Diagnosis Date   Allergy    Anemia    DURING MENSES--HAS HEAVY BLEEDING WITH PERODS   Angioedema 08/13/2020   Anxiety    Back pain, chronic    "ongoing"   Blood transfusion    IN 2012  AFTER C -SECTION   Cerebral thrombosis with cerebral infarction [facility]) JUNE 2011   RIGHT SIDED WEAKNESS ( ARM AND LEG ) AND SPASMS-remains with slight weakness and vertigo.   Constipation    Depression    Diabetes mellitus    Diabetic neuropathy (HCC)    BOTH FEET --COMES AND GOES   Edema, lower extremity    Endometriosis    Fatty liver    GERD (gastroesophageal reflux disease)    with pregnancy   H/O eye surgery    Headache(784.0)    MIGRAINES--NOT REALLY HEADACHE-MORE LIKE PRESSURE SENSATION IN HEAD-FEELS DIZZIY AND  FAINT AS THE PRESSURE  RESOLVES   Hernia, incisional, RLQ, s/p lap repair Sep 2013 09/02/2011   History of vertigo 03/22/2018   Hx of migraines 10/19/2011   Hypertension    Leg pain, right    "like bad Charley horse"   Multiple food allergies    Panic disorder without agoraphobia    Rash    HANDS, ARMS --STATES HX OF RASH EVER SINCE CHILDBIRTH/PREGNANCY.  STATES THE RASH OFTEN OCCURS WHEN SHE IS REALLY  STRESSED."goes and comes-presently left ring finger"   Restless leg syndrome    DIAGNOSED BY SLEEP STUDY - PT TOLD SHE DID NOT HAVE SLEEP APNEA   Right rotator cuff tear    PAIN IN RIGHT SHOULDER   SBO (small bowel obstruction) ([place]) 06/03/2012   Shortness of breath    Spastic hemiplegia affecting dominant side (HCC)    Stomach problems    Stroke (HCC)    Ventral hernia    RIGHT LOWER QUADRANT-CAUSING SOME PAIN   Weakness of right side of body     PAST SURGICAL HISTORY: Past Surgical History:  Procedure Laterality Date   APPLICATION OF WOUND VAC N/A 05/25/2015   Procedure: APPLICATION OF WOUND VAC;  Surgeon: Michael Boston, MD;  Location: [facility];  Service: General;  Laterality: N/A;   CESAREAN SECTION  2012   COLONOSCOPY     DIAGNOSTIC LAPAROSCOPY     ESOPHAGOGASTRODUODENOSCOPY N/A 06/03/2012   Procedure: ESOPHAGOGASTRODUODENOSCOPY (EGD);  Surgeon: [name] Craver, MD;  Location: [facility] ENDOSCOPY;  Service: Endoscopy;  Laterality: N/A;   EXCISION MASS ABDOMINAL N/A 05/25/2015   Procedure: ABDOMINAL WALL EXPLORATION EXCISION OF SEROMA REMOVAL OF REDUNDANT SKIN ;  Surgeon: Michael Boston, MD;  Location: [facility];  Service: General;  Laterality: N/A;   EYE SURGERY     Eye laser for vessel hemorrhaging   fybroid removal     HERNIA REPAIR  10/03/11   ventral hernia repair   INSERTION OF MESH N/A 02/04/2013   Procedure: INSERTION OF MESH;  Surgeon: Adin Hector, MD;  Location: [facility];  Service: General;  Laterality: N/A;   UMBILICAL HERNIA REPAIR N/A 02/04/2013   Procedure: LAPAROSCOPIC ventral wall hernia repair LAPAROSCOPIC LYSIS OF ADHESIONS laparoscopic exploration of abdomen ;  Surgeon: Adin Hector, MD;  Location: [facility];  Service: General;  Laterality: N/A;   UPPER GASTROINTESTINAL ENDOSCOPY     URETER REVISION     Bilateral "twisted"   UTERINE FIBROID SURGERY     2 SURGERIES FOR FIBROIDS   VENTRAL HERNIA REPAIR  10/03/2011   Procedure: LAPAROSCOPIC VENTRAL HERNIA;  Surgeon: Adin Hector, MD;  Location: [facility];  Service: General;  Laterality: N/A;    FAMILY HISTORY: The patient family history includes Alcoholism in her mother; Allergic rhinitis in her mother; Anxiety disorder in her mother; Bipolar disorder in her mother; Colon cancer in her paternal uncle; Depression in her father and mother; Diabetes in her father and maternal grandmother; Drug abuse in her father and mother; Eating disorder in her mother; Eczema in her mother and sister; Hyperlipidemia in her paternal grandmother; Kidney disease in her father; Stroke in her paternal grandmother; Urticaria in her mother and sister.   SOCIAL HISTORY:  The patient  reports that she has never smoked. She has never used smokeless tobacco. She reports that she does not drink alcohol and does not use drugs.  MEDICATIONS: Current Outpatient Medications  Medication Instructions   albuterol (VENTOLIN HFA) 108 (90 Base) MCG/ACT inhaler 2 puffs, Inhalation, Every 4 hours PRN  allopurinol (ZYLOPRIM) 100 mg, Oral, Daily at bedtime   aspirin EC 81 mg, Oral, Daily at bedtime, Swallow whole.    atorvastatin (LIPITOR) 10 mg, Oral, Daily   Basaglar KwikPen 22 Units, Subcutaneous, Daily at bedtime   carvedilol (COREG) 25 mg, Oral, 2 times daily   cetirizine (ZYRTEC) 10 MG tablet SMARTSIG:1 Tablet(s) By Mouth 1-2 Times Daily   cloNIDine (CATAPRES) 0.4 mg, Oral, 2 times daily   Continuous Blood Gluc Receiver (FREESTYLE LIBRE 2 READER) DEVI Daily, as directed   Continuous Blood Gluc Sensor (FREESTYLE LIBRE 2 SENSOR) MISC 1 Device, Subcutaneous, Every 14 days   DULoxetine (CYMBALTA) 20-40 mg, Oral, See admin instructions, Take 20 mg by mouth in the morning and 40 mg at bedtime   EPINEPHrine (AUVI-Q) 0.3 mg, Intramuscular, As needed   famotidine (PEPCID) 20 mg, Oral, 2 times daily   furosemide (LASIX) 40 mg, Oral, Every morning   gabapentin (NEURONTIN) 300 MG capsule TAKE 2 CAPSULES BY MOUTH THREE TIMES DAILY   insulin aspart (NOVOLOG)  5-15 Units, Subcutaneous, See admin instructions, Inject 5-15 units into the skin three times a day with meals, PER SLIDING SCALE   omeprazole (PRILOSEC) 40 MG capsule Take 1 capsule by mouth once daily   Semaglutide (OZEMPIC, 0.25 OR 0.5 MG/DOSE, [place]) 0.5 mg, Subcutaneous, Every Sat   tiZANidine (ZANAFLEX) 2 mg, Oral, Daily at bedtime   traMADol (ULTRAM) 50 MG tablet Take 1 tablet by mouth twice daily   Vitamin D (Ergocalciferol) (DRISDOL) 50,000 Units, Oral, Every 7 days     heparin 1,150 Units/hr (09/05/20 1435)   nitroGLYCERIN 20 mcg/min (09/05/20 1549)    REVIEW OF SYSTEMS: Review of Systems  Constitutional: Positive for diaphoresis. Negative for chills and fever.  HENT:  Negative for hoarse voice and nosebleeds.   Eyes:  Negative for discharge, double vision and pain.  Cardiovascular:  Positive for chest pain and dyspnea on exertion. Negative for claudication, leg swelling, near-syncope, orthopnea, palpitations, paroxysmal nocturnal dyspnea and syncope.  Respiratory:  Positive for shortness of breath. Negative for hemoptysis.   Musculoskeletal:  Negative for muscle cramps and myalgias.  Gastrointestinal:  Positive for nausea. Negative for abdominal pain, constipation, diarrhea, hematemesis, hematochezia, melena and vomiting.  Neurological:  Negative for dizziness and light-headedness.  All other systems reviewed and are negative.  PHYSICAL EXAM: Vitals with BMI 09/05/2020 09/05/2020 09/05/2020  Height - - -  Weight - - -  BMI - - -  Systolic [id number] 0000000  Diastolic 86 73 69  Pulse - - -    No intake or output data in the 24 hours ending 09/05/20 1600  Net IO Since Admission: No IO data has been entered for this period [09/05/20 1600] CONSTITUTIONAL: Well-developed and well-nourished. No acute distress.  SKIN: Skin is warm and dry. No rash noted. No cyanosis. No pallor. No jaundice HEAD: Normocephalic and atraumatic.  EYES: No scleral icterus MOUTH/THROAT: Moist oral  membranes.  NECK: No JVD present. No thyromegaly noted. No carotid bruits  LYMPHATIC: No visible cervical adenopathy.  CHEST Normal respiratory effort. No intercostal retractions  LUNGS: Clear to auscultation bilaterally.  No stridor. No wheezes. No rales.  CARDIOVASCULAR: Regular, positive Q000111Q, soft holosystolic murmur heard at the apex, no gallops or rubs. ABDOMINAL: Obese, soft, nontender, nondistended, positive bowel sounds in all 4 quadrants, no apparent ascites.  EXTREMITIES: No peripheral edema, warm to touch, 2+ PT pulses. HEMATOLOGIC: No significant bruising NEUROLOGIC: Oriented to person, place, and time. Nonfocal. Normal muscle tone.  PSYCHIATRIC: Normal mood  and affect. Normal behavior. Cooperative  RADIOLOGY: DG Chest 2 View  Result Date: 09/05/2020 CLINICAL DATA:  Chest pain, right arm pain EXAM: CHEST - 2 VIEW COMPARISON:  Chest radiograph 08/27/2020 FINDINGS: The heart is mildly enlarged, unchanged. The mediastinal contours are within normal limits. The lungs are clear, with no focal consolidation or pulmonary edema. There is no pleural effusion or pneumothorax. There is no acute osseous abnormality. IMPRESSION: Unchanged cardiomegaly. Otherwise, no radiographic evidence of acute cardiopulmonary process. Electronically Signed   By: Valetta Mole M.D.   On: 09/05/2020 12:21    LABORATORY DATA: Lab Results  Component Value Date   WBC 6.9 09/05/2020   HGB 12.4 09/05/2020   HCT 38.5 09/05/2020   MCV 84.8 09/05/2020   PLT 297 09/05/2020    Recent Labs  Lab 09/05/20 1137  NA 137  K 4.5  CL 104  CO2 26  BUN 11  CREATININE 1.23*  CALCIUM 8.7*  GLUCOSE 238*    Lipid Panel  Lab Results  Component Value Date   CHOL 191 08/02/2019   HDL 54 08/02/2019   LDLCALC 102 08/02/2019   TRIG 207 (A) 08/02/2019   CHOLHDL 6.0 10/17/2011    BNP (last 3 results) No results for input(s): BNP in the last 8760 hours.  HEMOGLOBIN A1C Lab Results  Component Value Date    HGBA1C 8.4 (H) 08/13/2020   MPG 194.38 08/13/2020    Cardiac Panel (last 3 results) No results for input(s): CKTOTAL, CKMB, RELINDX in the last 8760 hours.  Invalid input(s): TROPONINHS  Lab Results  Component Value Date   CKTOTAL 59 06/16/2009   CKMB 1.1 06/16/2009     TSH No results for input(s): TSH in the last 8760 hours.    CARDIAC DATABASE: EKG: 09/05/2020 1128: Normal sinus rhythm, 73 bpm, without underlying ischemia or injury pattern.   Echocardiogram 08/21/2017: Left ventricle cavity is normal in size. Mild concentric hypertrophy of the left ventricle. Normal global wall motion. Doppler evidence of grade II (pseudonormal) diastolic dysfunction, elevated LAP. Calculated EF 55%. Mildly restricted aortic valve leaflets with trace aortic valve stenosis. Aortic valve mean gradient of 8 mmHg, Vmax of 2.0 m/s. Calculated aortic valve area by continuity equation is 1.4 cm. No evidence of pulmonary hypertension.   Lexiscan myoview stress test 08/21/2017:  1. Lexiscan stress test was performed. Exercise capacity was not assessed. Resting BP 148/90 mmHg, peak effect BP 168/90 mmHg. Stress symptoms included dizziness, nausea, headache, chest tightness.  2. The overall quality of the study is good. There is no evidence of abnormal lung activity. Stress and rest SPECT images demonstrate homogeneous tracer distribution throughout the myocardium. Gated SPECT imaging reveals normal myocardial thickening and wall motion. The left ventricular ejection fraction was normal calculated as 45%, although visually appears normal.  3. Low risk study.   ABI 08/21/2017: This exam reveals normal perfusion of both the  lower extremity (RABI 1.27 and LABI 1.20 with biphasic waveform).  ABI may be falsely elevated in patients with DM and medial calcinosis.   Heart Catheterization: None  IMPRESSION & RECOMMENDATIONS: Tina [name] is a 50 y.o.  female whose past medical history and cardiovascular  risk factors include:  history of CVA with residual right-sided hemiparesis which is essentially improved and does very minimal residual defect, insulin dependent diabetes mellitus, obesity, hypertension, obesity.   Non-STEMI: Patient's symptoms are very concerning for cardiac etiology and high sensitive troponins are greater than 4000. EKG shows normal sinus rhythm without underlying injury pattern. Patient  is currently on IV heparin drip. Nitro drip to titrate for blood pressure and chest pain Patient is treated for her contrast allergy. The procedure of left heart catheterization with possible intervention was explained to the patient in detail.  The indication, alternatives, risks and benefits were reviewed.  Complications include but not limited to bleeding, infection, vascular injury, stroke, myocardial infection, arrhythmia, kidney injury requiring temporary or permanent dialysis, radiation-related injury in the case of prolonged fluoroscopy use, emergency cardiac surgery, and death. The patient understands the risks of serious complication is 1-2 in [id number] with diagnostic cardiac cath and 1-2% or less with angioplasty/stenting.  The patient voices understanding and provides verbal feedback and wishes to proceed with coronary angiography with possible PCI. Echocardiogram will be ordered to evaluate for structural heart disease and left ventricular systolic function.   Benign essential hypertension: Not well controlled. Multiple allergies to antihypertensive medications. Currently on clonidine and carvedilol. Started on IV nitro drip.  We will further uptitrate oral medications as tolerated.   Insulin-dependent diabetes mellitus type 2: Management per primary team.   Consultants: None   Code Status: Full    Family Communication:  No family at bedside    Disposition Plan: Home   CRITICAL CARE Performed by: Rex Kras   Total critical care time: 40 minutes   Critical care time  was exclusive of separately billable procedures and treating other patients.   Critical care was necessary to treat or prevent imminent or life-threatening deterioration.   Critical care was time spent personally by me on the following activities: development of treatment plan with patient and/or surrogate as well as nursing, discussions with consultants, evaluation of patient's response to treatment, examination of patient, obtaining history from patient or surrogate, ordering and performing treatments and interventions, ordering and review of laboratory studies, ordering and review of radiographic studies, pulse oximetry and re-evaluation of patient's condition.   Patient's questions and concerns were addressed to her satisfaction. She voices understanding of the instructions provided during this encounter.    This note was created using a voice recognition software as a result there may be grammatical errors inadvertently enclosed that do not reflect the nature of this encounter. Every attempt is made to correct such errors.  Of Note, the original note was done as  a consult and then the patient was going to be admitted to our service so it was carried forward to a H&P so some elements may have been copied and pasted from that document but originally done by me.   [facility]  Pager: [phone] Office: [phone] 09/05/2020, 4:00 PM

## 2020-09-05 NOTE — ED Provider Notes (Addendum)
Emergency Medicine Provider Triage Evaluation Note  Tina [name] , a 50 y.o. female  was evaluated in triage.  Pt complains of hypertension along with chest pain.  Also reports measuring her blood pressure prior to taking her blood pressure medication.  Also endorsing some shortness of breath along with nausea and vomiting.  Similar symptomology about 10 days ago.  Vitals are within normal limits today.  Review of Systems  Positive: Chest pain, shortness of breath Negative: Fever, abdominal pain  Physical Exam  There were no vitals taken for this visit. Gen:   Awake, no distress   Resp:  Normal effort  MSK:   Moves extremities without difficulty  Other:    Medical Decision Making  Medically screening exam initiated at 11:34 AM.  Appropriate orders placed.  Tina [name] was informed that the remainder of the evaluation will be completed by another provider, this initial triage assessment does not replace that evaluation, and the importance of remaining in the ED until their evaluation is complete.     Janeece Fitting, PA-C 09/05/20 1136    Janeece Fitting, PA-C 09/05/20 1141    Daleen Bo, MD 09/05/20 1622

## 2020-09-05 NOTE — Consult Note (Deleted)
CARDIOLOGY CONSULT NOTE  Patient ID: Tina [name] MRN: [id number] DOB/AGE: 16-Sep-1970 50 y.o.  Admit date: 09/05/2020 Attending physician: Isla Pence, MD Primary Physician:  [name], DO Outpatient Cardiologist: Rex Kras, DO, [facility] Inpatient Cardiologist: Rex Kras, DO, [facility]  Reason of consultation: Chest Pain Referring physician: Isla Pence, MD   Chief complaint: chest pain   HPI:  Tina [name] is a [name] who presents with a chief complaint of " chest pain." Her past medical history and cardiovascular risk factors include: history of CVA with residual right-sided hemiparesis which is essentially improved and does very minimal residual defect, insulin dependent diabetes mellitus, obesity, hypertension, obesity.  Presents to the hospital today with a chief complaint of chest pain.  Patient states that the chest pain is located over the right anterior precordium, started yesterday, intensity 9 out of 10, pressure/squeezing-like sensation, constant, worse with effort related activities, and at times improves with rest but not always.  Patient states that she has been back and forth for elevated blood pressures.  She is intolerant/allergic to multiple antihypertensive medications contributing to her underlying uncontrolled hypertension.  Patient states that this pain is significantly different than what she has experienced in the past.  Work-up in the ER notes elevated troponins with a first high sensitive troponin 4982 followed by 4287.  ALLERGIES: Allergies  Allergen Reactions   Amlodipine Benzoate Swelling and Other (See Comments)    Leg swelling   Contrast Media [Iodinated Diagnostic Agents] Shortness Of Breath and Other (See Comments)    Difficulty breathing   Iodine Anaphylaxis   Iohexol Hives, Nausea And Vomiting, Swelling and Other (See Comments)     Desc: Magnevist-gadolinium-difficulty breathing, throat swelling     Midazolam Hcl Anaphylaxis    Difficulty breathing   Other Shortness Of Breath, Itching and Other (See Comments)    Patient is allergic to all nuts except peanuts, which are NOT "nuts"- they are legumes   Shellfish Allergy Anaphylaxis   Shellfish-Derived Products Anaphylaxis   Valsartan Swelling   Metformin And Related Diarrhea, Nausea And Vomiting and Other (See Comments)    Dehydration, also   [place] [Dapagliflozin] Nausea Only and Other (See Comments)    UTI, headaches and muscle aches, also   Hydralazine Other (See Comments)    Reaction not recalled   Saxagliptin Hives, Nausea And Vomiting and Other (See Comments)    ONGLYZA- dehydration, also   Spironolactone Other (See Comments)    Elevated potassium- Severe hyperkalemia   Avandia [Rosiglitazone Maleate] Hives and Other (See Comments)   Geodon [Ziprasidone] Other (See Comments)    Reaction not recalled   Kiwi Extract Itching and Swelling   Latex Itching    PAST MEDICAL HISTORY: Past Medical History:  Diagnosis Date   Allergy    Anemia    DURING MENSES--HAS HEAVY BLEEDING WITH PERODS   Angioedema 08/13/2020   Anxiety    Back pain, chronic    "ongoing"   Blood transfusion    IN 2012  AFTER C -SECTION   Cerebral thrombosis with cerebral infarction [facility]) JUNE 2011   RIGHT SIDED WEAKNESS ( ARM AND LEG ) AND SPASMS-remains with slight weakness and vertigo.   Constipation    Depression    Diabetes mellitus    Diabetic neuropathy (HCC)    BOTH FEET --COMES AND GOES   Edema, lower extremity    Endometriosis    Fatty liver    GERD (gastroesophageal reflux disease)    with pregnancy  H/O eye surgery    Headache(784.0)    MIGRAINES--NOT REALLY HEADACHE-MORE LIKE PRESSURE SENSATION IN HEAD-FEELS DIZZIY AND  FAINT AS THE PRESSURE RESOLVES   Hernia, incisional, RLQ, s/p lap repair Sep 2013 09/02/2011   History of vertigo 03/22/2018   Hx of migraines 10/19/2011   Hypertension    Leg pain, right    "like bad Charley horse"    Multiple food allergies    Panic disorder without agoraphobia    Rash    HANDS, ARMS --STATES HX OF RASH EVER SINCE CHILDBIRTH/PREGNANCY.  STATES THE RASH OFTEN OCCURS WHEN SHE IS REALLY STRESSED."goes and comes-presently left ring finger"   Restless leg syndrome    DIAGNOSED BY SLEEP STUDY - PT TOLD SHE DID NOT HAVE SLEEP APNEA   Right rotator cuff tear    PAIN IN RIGHT SHOULDER   SBO (small bowel obstruction) ([place]) 06/03/2012   Shortness of breath    Spastic hemiplegia affecting dominant side (HCC)    Stomach problems    Stroke (HCC)    Ventral hernia    RIGHT LOWER QUADRANT-CAUSING SOME PAIN   Weakness of right side of body     PAST SURGICAL HISTORY: Past Surgical History:  Procedure Laterality Date   APPLICATION OF WOUND VAC N/A 05/25/2015   Procedure: APPLICATION OF WOUND VAC;  Surgeon: Michael Boston, MD;  Location: [facility];  Service: General;  Laterality: N/A;   CESAREAN SECTION  2012   COLONOSCOPY     DIAGNOSTIC LAPAROSCOPY     ESOPHAGOGASTRODUODENOSCOPY N/A 06/03/2012   Procedure: ESOPHAGOGASTRODUODENOSCOPY (EGD);  Surgeon: [name] Craver, MD;  Location: [facility] ENDOSCOPY;  Service: Endoscopy;  Laterality: N/A;   EXCISION MASS ABDOMINAL N/A 05/25/2015   Procedure: ABDOMINAL WALL EXPLORATION EXCISION OF SEROMA REMOVAL OF REDUNDANT SKIN ;  Surgeon: Michael Boston, MD;  Location: [facility];  Service: General;  Laterality: N/A;   EYE SURGERY     Eye laser for vessel hemorrhaging   fybroid removal     HERNIA REPAIR  10/03/11   ventral hernia repair   INSERTION OF MESH N/A 02/04/2013   Procedure: INSERTION OF MESH;  Surgeon: Adin Hector, MD;  Location: [facility];  Service: General;  Laterality: N/A;   UMBILICAL HERNIA REPAIR N/A 02/04/2013   Procedure: LAPAROSCOPIC ventral wall hernia repair LAPAROSCOPIC LYSIS OF ADHESIONS laparoscopic exploration of abdomen ;  Surgeon: Adin Hector, MD;  Location: [facility];  Service: General;  Laterality: N/A;   UPPER GASTROINTESTINAL ENDOSCOPY     URETER  REVISION     Bilateral "twisted"   UTERINE FIBROID SURGERY     2 SURGERIES FOR FIBROIDS   VENTRAL HERNIA REPAIR  10/03/2011   Procedure: LAPAROSCOPIC VENTRAL HERNIA;  Surgeon: Adin Hector, MD;  Location: [facility];  Service: General;  Laterality: N/A;    FAMILY HISTORY: The patient family history includes Alcoholism in her mother; Allergic rhinitis in her mother; Anxiety disorder in her mother; Bipolar disorder in her mother; Colon cancer in her paternal uncle; Depression in her father and mother; Diabetes in her father and maternal grandmother; Drug abuse in her father and mother; Eating disorder in her mother; Eczema in her mother and sister; Hyperlipidemia in her paternal grandmother; Kidney disease in her father; Stroke in her paternal grandmother; Urticaria in her mother and sister.   SOCIAL HISTORY:  The patient  reports that she has never smoked. She has never used smokeless tobacco. She reports that she does not drink alcohol and does not use drugs.  MEDICATIONS:  Current Outpatient Medications  Medication Instructions   albuterol (VENTOLIN HFA) 108 (90 Base) MCG/ACT inhaler 2 puffs, Inhalation, Every 4 hours PRN   allopurinol (ZYLOPRIM) 100 mg, Oral, Daily at bedtime   aspirin EC 81 mg, Oral, Daily at bedtime, Swallow whole.    atorvastatin (LIPITOR) 10 mg, Oral, Daily   Basaglar KwikPen 22 Units, Subcutaneous, Daily at bedtime   carvedilol (COREG) 25 mg, Oral, 2 times daily   cetirizine (ZYRTEC) 10 MG tablet SMARTSIG:1 Tablet(s) By Mouth 1-2 Times Daily   cloNIDine (CATAPRES) 0.4 mg, Oral, 2 times daily   Continuous Blood Gluc Receiver (FREESTYLE LIBRE 2 READER) DEVI Daily, as directed   Continuous Blood Gluc Sensor (FREESTYLE LIBRE 2 SENSOR) MISC 1 Device, Subcutaneous, Every 14 days   DULoxetine (CYMBALTA) 20-40 mg, Oral, See admin instructions, Take 20 mg by mouth in the morning and 40 mg at bedtime   EPINEPHrine (AUVI-Q) 0.3 mg, Intramuscular, As needed   famotidine (PEPCID)  20 mg, Oral, 2 times daily   furosemide (LASIX) 40 mg, Oral, Every morning   gabapentin (NEURONTIN) 300 MG capsule TAKE 2 CAPSULES BY MOUTH THREE TIMES DAILY   insulin aspart (NOVOLOG) 5-15 Units, Subcutaneous, See admin instructions, Inject 5-15 units into the skin three times a day with meals, PER SLIDING SCALE   omeprazole (PRILOSEC) 40 MG capsule Take 1 capsule by mouth once daily   Semaglutide (OZEMPIC, 0.25 OR 0.5 MG/DOSE, Eclectic) 0.5 mg, Subcutaneous, Every Sat   tiZANidine (ZANAFLEX) 2 mg, Oral, Daily at bedtime   traMADol (ULTRAM) 50 MG tablet Take 1 tablet by mouth twice daily   Vitamin D (Ergocalciferol) (DRISDOL) 50,000 Units, Oral, Every 7 days    REVIEW OF SYSTEMS: Review of Systems  Constitutional: Positive for diaphoresis. Negative for chills and fever.  HENT:  Negative for hoarse voice and nosebleeds.   Eyes:  Negative for discharge, double vision and pain.  Cardiovascular:  Positive for chest pain and dyspnea on exertion. Negative for claudication, leg swelling, near-syncope, orthopnea, palpitations, paroxysmal nocturnal dyspnea and syncope.  Respiratory:  Positive for shortness of breath. Negative for hemoptysis.   Musculoskeletal:  Negative for muscle cramps and myalgias.  Gastrointestinal:  Positive for nausea. Negative for abdominal pain, constipation, diarrhea, hematemesis, hematochezia, melena and vomiting.  Neurological:  Negative for dizziness and light-headedness.  All other systems reviewed and are negative.  PHYSICAL EXAM: Vitals with BMI 09/05/2020 09/05/2020 09/05/2020  Height - - -  Weight - - -  BMI - - -  Systolic 123XX123 [id number]  Diastolic 86 47 68  Pulse 68 65 73    No intake or output data in the 24 hours ending 09/05/20 1531  Net IO Since Admission: No IO data has been entered for this period [09/05/20 1531]   CONSTITUTIONAL: Well-developed and well-nourished. No acute distress.  SKIN: Skin is warm and dry. No rash noted. No cyanosis. No pallor. No  jaundice HEAD: Normocephalic and atraumatic.  EYES: No scleral icterus MOUTH/THROAT: Moist oral membranes.  NECK: No JVD present. No thyromegaly noted. No carotid bruits  LYMPHATIC: No visible cervical adenopathy.  CHEST Normal respiratory effort. No intercostal retractions  LUNGS: Clear to auscultation bilaterally.  No stridor. No wheezes. No rales.  CARDIOVASCULAR: Regular, positive Q000111Q, soft holosystolic murmur heard at the apex, no gallops or rubs. ABDOMINAL: Obese, soft, nontender, nondistended, positive bowel sounds in all 4 quadrants, no apparent ascites.  EXTREMITIES: No peripheral edema, warm to touch, 2+ PT pulses. HEMATOLOGIC: No significant bruising NEUROLOGIC: Oriented to person,  place, and time. Nonfocal. Normal muscle tone.  PSYCHIATRIC: Normal mood and affect. Normal behavior. Cooperative  RADIOLOGY: DG Chest 2 View  Result Date: 09/05/2020 CLINICAL DATA:  Chest pain, right arm pain EXAM: CHEST - 2 VIEW COMPARISON:  Chest radiograph 08/27/2020 FINDINGS: The heart is mildly enlarged, unchanged. The mediastinal contours are within normal limits. The lungs are clear, with no focal consolidation or pulmonary edema. There is no pleural effusion or pneumothorax. There is no acute osseous abnormality. IMPRESSION: Unchanged cardiomegaly. Otherwise, no radiographic evidence of acute cardiopulmonary process. Electronically Signed   By: Valetta Mole M.D.   On: 09/05/2020 12:21    LABORATORY DATA: Lab Results  Component Value Date   WBC 6.9 09/05/2020   HGB 12.4 09/05/2020   HCT 38.5 09/05/2020   MCV 84.8 09/05/2020   PLT 297 09/05/2020    Recent Labs  Lab 09/05/20 1137  NA 137  K 4.5  CL 104  CO2 26  BUN 11  CREATININE 1.23*  CALCIUM 8.7*  GLUCOSE 238*    Lipid Panel  Lab Results  Component Value Date   CHOL 191 08/02/2019   HDL 54 08/02/2019   LDLCALC 102 08/02/2019   TRIG 207 (A) 08/02/2019   CHOLHDL 6.0 10/17/2011    BNP (last 3 results) No results for  input(s): BNP in the last 8760 hours.  HEMOGLOBIN A1C Lab Results  Component Value Date   HGBA1C 8.4 (H) 08/13/2020   MPG 194.38 08/13/2020    Cardiac Panel (last 3 results) Recent Labs    09/05/20 1137 09/05/20 1425  TROPONINIHS 4,982* 4,287*     TSH No results for input(s): TSH in the last 8760 hours.   CARDIAC DATABASE: EKG: 09/05/2020 1128: Normal sinus rhythm, 73 bpm, without underlying ischemia or injury pattern.  Echocardiogram 08/21/2017: Left ventricle cavity is normal in size. Mild concentric hypertrophy of the left ventricle. Normal global wall motion. Doppler evidence of grade II (pseudonormal) diastolic dysfunction, elevated LAP. Calculated EF 55%. Mildly restricted aortic valve leaflets with trace aortic valve stenosis. Aortic valve mean gradient of 8 mmHg, Vmax of 2.0 m/s. Calculated aortic valve area by continuity equation is 1.4 cm. No evidence of pulmonary hypertension.   Lexiscan myoview stress test 08/21/2017:  1. Lexiscan stress test was performed. Exercise capacity was not assessed. Resting BP 148/90 mmHg, peak effect BP 168/90 mmHg. Stress symptoms included dizziness, nausea, headache, chest tightness.  2. The overall quality of the study is good. There is no evidence of abnormal lung activity. Stress and rest SPECT images demonstrate homogeneous tracer distribution throughout the myocardium. Gated SPECT imaging reveals normal myocardial thickening and wall motion. The left ventricular ejection fraction was normal calculated as 45%, although visually appears normal.  3. Low risk study.   ABI 08/21/2017: This exam reveals normal perfusion of both the  lower extremity (RABI 1.27 and LABI 1.20 with biphasic waveform).  ABI may be falsely elevated in patients with DM and medial calcinosis.  Heart Catheterization: None  IMPRESSION & RECOMMENDATIONS: Tina [name] is a [name] whose past medical history and cardiovascular risk  factors include: history of CVA with residual right-sided hemiparesis which is essentially improved and does very minimal residual defect, insulin dependent diabetes mellitus, obesity, hypertension, obesity.  Non-STEMI: Patient's symptoms are very concerning for cardiac etiology and high sensitive troponins are greater than 4000. EKG shows normal sinus rhythm without underlying injury pattern. Patient is currently on IV heparin drip. Nitro drip to titrate for blood pressure and  chest pain Patient is treated for her contrast allergy. The procedure of left heart catheterization with possible intervention was explained to the patient in detail.  The indication, alternatives, risks and benefits were reviewed.  Complications include but not limited to bleeding, infection, vascular injury, stroke, myocardial infection, arrhythmia, kidney injury requiring temporary or permanent dialysis, radiation-related injury in the case of prolonged fluoroscopy use, emergency cardiac surgery, and death. The patient understands the risks of serious complication is 1-2 in 123XX123 with diagnostic cardiac cath and 1-2% or less with angioplasty/stenting.  The patient voices understanding and provides verbal feedback and wishes to proceed with coronary angiography with possible PCI. Echocardiogram will be ordered to evaluate for structural heart disease and left ventricular systolic function.  Benign essential hypertension: Not well controlled. Multiple allergies to antihypertensive medications. Currently on clonidine and carvedilol. Started on IV nitro drip.  We will further uptitrate oral medications as tolerated.  Insulin-dependent diabetes mellitus type 2: Management per primary team.  Thank you for allowing [name] to participate in the care of Tina [name] please reach out if any questions or concerns arise.  Continuing care regarding her other chronic comorbid conditions.  CRITICAL CARE Performed by: Rex Kras   Total critical care time: 40 minutes   Critical care time was exclusive of separately billable procedures and treating other patients.   Critical care was necessary to treat or prevent imminent or life-threatening deterioration.   Critical care was time spent personally by me on the following activities: development of treatment plan with patient and/or surrogate as well as nursing, discussions with consultants, evaluation of patient's response to treatment, examination of patient, obtaining history from patient or surrogate, ordering and performing treatments and interventions, ordering and review of laboratory studies, ordering and review of radiographic studies, pulse oximetry and re-evaluation of patient's condition.  Patient's questions and concerns were addressed to her satisfaction. She voices understanding of the instructions provided during this encounter.   This note was created using a voice recognition software as a result there may be grammatical errors inadvertently enclosed that do not reflect the nature of this encounter. Every attempt is made to correct such errors.  Mechele Claude [facility]  Pager: [phone] Office: [phone] 09/05/2020, 3:31 PM

## 2020-09-05 NOTE — ED Notes (Signed)
Informed consent obtained, pt verbalized understanding.

## 2020-09-05 NOTE — Progress Notes (Signed)
ANTICOAGULATION CONSULT NOTE - Initial Consult  Pharmacy Consult for heparin Indication: chest pain/ACS  Allergies: see allergy tab   Patient Measurements:  Heparin Dosing Weight: 86.7 kg  Vital Signs: Temp: 98.7 F (37.1 C) (08/24 1259) Temp Source: Oral (08/24 1259) BP: 135/47 (08/24 1259) Pulse Rate: 65 (08/24 1259)  Labs: Recent Labs    09/05/20 1137  HGB 12.4  HCT 38.5  PLT 297  CREATININE 1.23*  TROPONINIHS 4,982*   Estimated Creatinine Clearance: 70 mL/min (A) (by C-G formula based on SCr of 1.23 mg/dL (H)).  Medical History: see medical history tab Medications: see medication tab  Assessment: 50 yo F presenting to ED with HTN and chest pain with some SOB. Was seen for the same on 8/15. No AC PTA. CBC wnl.   Goal of Therapy:  Heparin level 0.3-0.7 units/ml Monitor platelets by anticoagulation protocol: Yes   Plan:  Give 4000 units bolus x 1 Start heparin infusion at 1150 units/hr Check anti-Xa level in 6 hours and daily while on heparin Continue to monitor H&H and platelets  Joetta Manners, PharmD, [facility] Emergency Medicine Clinical Pharmacist ED RPh Phone: [place]: [phone]

## 2020-09-06 ENCOUNTER — Inpatient Hospital Stay (HOSPITAL_COMMUNITY): Payer: Federal, State, Local not specified - PPO

## 2020-09-06 ENCOUNTER — Other Ambulatory Visit: Payer: Self-pay

## 2020-09-06 ENCOUNTER — Other Ambulatory Visit (HOSPITAL_COMMUNITY): Payer: Self-pay

## 2020-09-06 ENCOUNTER — Encounter (HOSPITAL_COMMUNITY): Payer: Self-pay | Admitting: Cardiology

## 2020-09-06 DIAGNOSIS — R079 Chest pain, unspecified: Secondary | ICD-10-CM

## 2020-09-06 DIAGNOSIS — R778 Other specified abnormalities of plasma proteins: Secondary | ICD-10-CM

## 2020-09-06 DIAGNOSIS — R7989 Other specified abnormal findings of blood chemistry: Secondary | ICD-10-CM

## 2020-09-06 DIAGNOSIS — Z955 Presence of coronary angioplasty implant and graft: Secondary | ICD-10-CM

## 2020-09-06 LAB — BASIC METABOLIC PANEL
Anion gap: 10 (ref 5–15)
BUN: 15 mg/dL (ref 6–20)
CO2: 22 mmol/L (ref 22–32)
Calcium: 8.3 mg/dL — ABNORMAL LOW (ref 8.9–10.3)
Chloride: 101 mmol/L (ref 98–111)
Creatinine, Ser: 1.29 mg/dL — ABNORMAL HIGH (ref 0.44–1.00)
GFR, Estimated: 50 mL/min — ABNORMAL LOW (ref >60)
Glucose, Bld: 456 mg/dL — ABNORMAL HIGH (ref 70–99)
Potassium: 3.9 mmol/L (ref 3.5–5.1)
Sodium: 133 mmol/L — ABNORMAL LOW (ref 135–145)

## 2020-09-06 LAB — CBC
HCT: 35.5 % — ABNORMAL LOW (ref 36.0–46.0)
Hemoglobin: 11.9 g/dL — ABNORMAL LOW (ref 12.0–15.0)
MCH: 27.7 pg (ref 26.0–34.0)
MCHC: 33.5 g/dL (ref 30.0–36.0)
MCV: 82.6 fL (ref 80.0–100.0)
Platelets: 284 10*3/uL (ref 150–400)
RBC: 4.3 MIL/uL (ref 3.87–5.11)
RDW: 13.3 % (ref 11.5–15.5)
WBC: 9.9 10*3/uL (ref 4.0–10.5)
nRBC: 0 % (ref 0.0–0.2)

## 2020-09-06 LAB — LIPID PANEL
Cholesterol: 175 mg/dL (ref 0–200)
HDL: 56 mg/dL (ref >40)
LDL Cholesterol: 92 mg/dL (ref 0–99)
Total CHOL/HDL Ratio: 3.1 RATIO
Triglycerides: 137 mg/dL (ref <150)
VLDL: 27 mg/dL (ref 0–40)

## 2020-09-06 LAB — ECHOCARDIOGRAM COMPLETE
Area-P 1/2: 3.48 cm2
S' Lateral: 3.1 cm
Weight: 4080 oz

## 2020-09-06 LAB — GLUCOSE, CAPILLARY
Glucose-Capillary: 440 mg/dL — ABNORMAL HIGH (ref 70–99)
Glucose-Capillary: 481 mg/dL — ABNORMAL HIGH (ref 70–99)

## 2020-09-06 MED ORDER — AMLODIPINE BESYLATE 5 MG PO TABS
5.0000 mg | ORAL_TABLET | Freq: Every day | ORAL | Status: DC
  Administered 2020-09-06: 5 mg via ORAL
  Filled 2020-09-06: qty 1

## 2020-09-06 MED ORDER — ISOSORBIDE MONONITRATE ER 30 MG PO TB24
30.0000 mg | ORAL_TABLET | Freq: Every day | ORAL | Status: DC
  Administered 2020-09-06: 30 mg via ORAL
  Filled 2020-09-06: qty 1

## 2020-09-06 MED ORDER — ATORVASTATIN CALCIUM 40 MG PO TABS
40.0000 mg | ORAL_TABLET | Freq: Every day | ORAL | 0 refills | Status: AC
  Filled 2020-09-06: qty 90, 90d supply, fill #0

## 2020-09-06 MED ORDER — ISOSORBIDE MONONITRATE ER 30 MG PO TB24
30.0000 mg | ORAL_TABLET | Freq: Every day | ORAL | 0 refills | Status: DC
  Filled 2020-09-06: qty 30, 30d supply, fill #0

## 2020-09-06 MED ORDER — AMLODIPINE BESYLATE 5 MG PO TABS
5.0000 mg | ORAL_TABLET | Freq: Every day | ORAL | 0 refills | Status: AC
  Filled 2020-09-06: qty 90, 90d supply, fill #0

## 2020-09-06 MED FILL — Lidocaine HCl Local Preservative Free (PF) Inj 1%: INTRAMUSCULAR | Qty: 30 | Status: AC

## 2020-09-06 NOTE — Progress Notes (Signed)
Her CBG this morning before breakfast was 440, and before lunch was 481.  Dr. Terri Skains made aware.  No new orders.  Idolina Primer, RN

## 2020-09-06 NOTE — Progress Notes (Signed)
  Echocardiogram 2D Echocardiogram has been performed.  Tina [name] 09/06/2020, 12:03 PM

## 2020-09-06 NOTE — Plan of Care (Signed)
  Problem: Education: Goal: Knowledge of General Education information will improve Description: Including pain rating scale, medication(s)/side effects and non-pharmacologic comfort measures Outcome: Completed/Met

## 2020-09-06 NOTE — Progress Notes (Signed)
Results for GLAYDS, ROHWEDER (MRN [id number]) as of 09/06/2020 12:38  Ref. Range 08/14/2020 16:18 09/05/2020 18:50 09/05/2020 20:58 09/06/2020 07:41 09/06/2020 11:41  Glucose-Capillary Latest Ref Range: 70 - 99 mg/dL 468 (H) 300 (H) 383 (H) 440 (H) 481 (H)  Noted that blood sugars have been greater than 300 mg/dl.   Recommend increasing Semglee to 30 units daily and add Novolog 4 units TID with meals if patient eats at least 50% of meal.   Harvel Ricks RN BSN CDE Diabetes Coordinator Pager: [phone]  8am-5pm

## 2020-09-06 NOTE — Progress Notes (Signed)
CARDIAC REHAB PHASE I   PRE:  Rate/Rhythm: 89 SR  BP:  Supine:   Sitting: 152/88  Standing:    SaO2: 97%RA  MODE:  Ambulation: 400 ft   POST:  Rate/Rhythm: 103 ST  BP:  Supine:   Sitting: 175/78  Standing:    SaO2: 98%RA 0834-0945 Pt walked 400 ft on RA with hand held asst. No CP. Tolerated well. MI education completed with pt and husband who voiced understanding. Stressed importance of plavix with stent. Discussed MI restrictions, walking for ex, heart healthy and diabetic food choices, NTG use, and CRP 2. Referred to [place] CRP 2.   Graylon Good, RN BSN  09/06/2020 9:53 AM

## 2020-09-06 NOTE — Plan of Care (Signed)

## 2020-09-06 NOTE — Discharge Summary (Signed)
Physician Discharge Summary  Patient ID: Tina [name] MRN: [id number] DOB/AGE: 1970-09-21 50 y.o.  Admit date: 09/05/2020 Discharge date: 09/06/2020  Primary Discharge Diagnosis:  NSTEMI Established coronary artery disease status post angioplasty and stent  Secondary Discharge Diagnosis: Benign essential hypertension. History of CVA with residual right-sided hemiparesis. Insulin-dependent diabetes mellitus type 2. Obesity due to excess calories.  Hospital Course:   [name]  with a chief complaint of "chest pain." Her past medical history and cardiovascular risk factors include: history of CVA with residual right-sided hemiparesis which is essentially improved and does very minimal residual defect, insulin dependent diabetes mellitus, obesity, hypertension, obesity.  Presents with symptoms concerning for angina pectoris and later found to have high sensitive troponins greater than 4000.  Given the patient's symptoms and elevated cardiac biomarkers that shared decision was to proceed with left heart catheterization with possible intervention.  Patient was found to have obstructive disease disease involving the obtuse marginal and underwent angioplasty and stenting as described below.  Post procedure patient has done well without any immediate complications.  She is chest pain-free.  Blood pressures have improved.  And patient wishes to go home.   Discharge Exam: PHYSICAL EXAM: Vitals with BMI 09/06/2020 09/06/2020 09/06/2020  Height - - -  Weight - - -  BMI - - -  Systolic [phone]  Diastolic 66 75 78  Pulse 78 - -    CONSTITUTIONAL: Well-developed and well-nourished. No acute distress.  SKIN: Skin is warm and dry. No rash noted. No cyanosis. No pallor. No jaundice HEAD: Normocephalic and atraumatic.  EYES: No scleral icterus MOUTH/THROAT: Moist oral membranes.  NECK: No JVD present. No thyromegaly noted.  Left carotid bruit. LYMPHATIC: No  visible cervical adenopathy.  CHEST Normal respiratory effort. No intercostal retractions  LUNGS: Clear to auscultation bilaterally.  No stridor. No wheezes. No rales.  CARDIOVASCULAR: Regular, positive S1-S2, no murmurs rubs or gallops appreciated. ABDOMINAL: Obese, soft, nontender, nondistended, positive bowel sounds all 4 quadrants, no apparent ascites.  EXTREMITIES: No peripheral edema, warm to touch, 2+ PT and DP pulses. HEMATOLOGIC: No significant bruising NEUROLOGIC: Oriented to person, place, and time. Nonfocal. Normal muscle tone.  PSYCHIATRIC: Normal mood and affect. Normal behavior. Cooperative   Recommendations on discharge:   Established coronary artery disease with angina pectoris on presentation status post angioplasty and stenting: Dual antiplatelet therapy for 1 year-up until 09/05/2021 Educated in the importance of improving her modifiable cardiovascular risk factors Educated on better blood pressure management and glycemic control. Transition IV nitro drip to Imdur. Patient is intolerant to amlodipine which causes lower extremity swelling; however, given her recent NSTEMI and elevated blood pressures patient is not willing to have a retrial of amlodipine. Medication sent to [facility]. Cardiac rehab.  Benign essential hypertension: Improving Has multiple allergies as well as intolerances to antihypertensive medications. Currently on clonidine, carvedilol.  We will add Imdur and amlodipine Low-salt diet recommended.  Insulin-dependent diabetes mellitus type 2: Patient's glycemic control during the hospitalization was not optimal. Patient is encouraged to follow-up with PCP within the next week for further titration of her insulin regimen.  CARDIAC DATABASE: EKG: 09/05/2020 1128: Normal sinus rhythm, 73 bpm, without underlying ischemia or injury pattern.   Echocardiogram 08/21/2017: Left ventricle cavity is normal in size. Mild concentric hypertrophy of the left  ventricle. Normal global wall motion. Doppler evidence of grade II (pseudonormal) diastolic dysfunction, elevated LAP. Calculated EF 55%. Mildly restricted aortic valve leaflets with trace aortic valve stenosis. Aortic  valve mean gradient of 8 mmHg, Vmax of 2.0 m/s. Calculated aortic valve area by continuity equation is 1.4 cm. No evidence of pulmonary hypertension.   Lexiscan myoview stress test 08/21/2017:  1. Lexiscan stress test was performed. Exercise capacity was not assessed. Resting BP 148/90 mmHg, peak effect BP 168/90 mmHg. Stress symptoms included dizziness, nausea, headache, chest tightness.  2. The overall quality of the study is good. There is no evidence of abnormal lung activity. Stress and rest SPECT images demonstrate homogeneous tracer distribution throughout the myocardium. Gated SPECT imaging reveals normal myocardial thickening and wall motion. The left ventricular ejection fraction was normal calculated as 45%, although visually appears normal.  3. Low risk study.   ABI 08/21/2017: This exam reveals normal perfusion of both the  lower extremity (RABI 1.27 and LABI 1.20 with biphasic waveform).  ABI may be falsely elevated in patients with DM and medial calcinosis.   Heart Catheterization: 09/05/2020 by Dr. Virgina Jock: LM: Normal LAD: Distal apical 70% stenosis (non-culprit, very small caliber) Lcx: Mid Lcx 30% disease with just after a small OM1 branch (non-culprit)        Prox OM2 with 95% stenosis (culprit) RCA: Minimal luminalr irregularities   LVEF 50-55% with apical inferolateral hypokinesis   Successful percutaneous coronary intervention OM2        PTCA and stent placement 2.25 X 12 mm Onyx drug-eluting stent        IVUS guided Post dilatation using 2.25 mm [place] at 22 atm and 2.5 mm [place] at 16 atm  Labs:   Lab Results  Component Value Date   WBC 9.9 09/06/2020   HGB 11.9 (L) 09/06/2020   HCT 35.5 (L) 09/06/2020   MCV 82.6 09/06/2020   PLT 284 09/06/2020     Recent Labs  Lab 09/06/20 0254  NA 133*  K 3.9  CL 101  CO2 22  BUN 15  CREATININE 1.29*  CALCIUM 8.3*  GLUCOSE 456*    Lipid Panel     Component Value Date/Time   CHOL 175 09/06/2020 0254   CHOL 169 02/15/2018 1034   TRIG 137 09/06/2020 0254   HDL 56 09/06/2020 0254   HDL 41 02/15/2018 1034   CHOLHDL 3.1 09/06/2020 0254   VLDL 27 09/06/2020 0254   LDLCALC 92 09/06/2020 0254   LDLCALC 94 02/15/2018 1034    BNP (last 3 results) Recent Labs    09/05/20 1137  BNP 80.0    HEMOGLOBIN A1C Lab Results  Component Value Date   HGBA1C 8.4 (H) 08/13/2020   MPG 194.38 08/13/2020    Cardiac Panel (last 3 results) No results for input(s): CKTOTAL, CKMB, TROPONINI, RELINDX in the last 8760 hours.  Lab Results  Component Value Date   CKTOTAL 59 06/16/2009   CKMB 1.1 06/16/2009   TROPONINI <0.30 10/07/2013     TSH No results for input(s): TSH in the last 8760 hours.  Radiology: DG Chest 1 View  Result Date: 08/27/2020 CLINICAL DATA:  Dizziness and chest pain. Hx of DM, GERD, HTN, stroke. Pt is a nonsmoker. EXAM: CHEST  1 VIEW COMPARISON:  Chest x-ray 05/15/2020, CT chest 04/07/2004, chest x-ray 11/18/2015 FINDINGS: Slightly more prominent enlarged cardiac silhouette. Otherwise the heart size and mediastinal contours are unchanged. No focal consolidation. No pulmonary edema. No pleural effusion. No pneumothorax. No acute osseous abnormality. IMPRESSION: Slightly more prominent enlarged cardiac silhouette with some component that may be due to an AP portable technique. Recommend repeat PA and lateral view of the chest. Electronically  Signed   By: Iven Finn M.D.   On: 08/27/2020 20:47   DG Chest 2 View  Result Date: 09/05/2020 CLINICAL DATA:  Chest pain, right arm pain EXAM: CHEST - 2 VIEW COMPARISON:  Chest radiograph 08/27/2020 FINDINGS: The heart is mildly enlarged, unchanged. The mediastinal contours are within normal limits. The lungs are clear, with no focal  consolidation or pulmonary edema. There is no pleural effusion or pneumothorax. There is no acute osseous abnormality. IMPRESSION: Unchanged cardiomegaly. Otherwise, no radiographic evidence of acute cardiopulmonary process. Electronically Signed   By: Valetta Mole M.D.   On: 09/05/2020 12:21   CT HEAD WO CONTRAST (5MM)  Result Date: 08/27/2020 CLINICAL DATA:  Headache, intracranial hemorrhage suspected Patient reports headache, dizziness, and vision changes. Elevated blood pressure. EXAM: CT HEAD WITHOUT CONTRAST TECHNIQUE: Contiguous axial images were obtained from the base of the skull through the vertex without intravenous contrast. COMPARISON:  Brain MRI 10/07/2013, no interval brain imaging available. FINDINGS: Brain: No intracranial hemorrhage, mass effect, or midline shift. Brain volume is normal for age. Left caudate lacunar infarct is remote but new from 2015. Small remote lacunar infarcts in the left pons and basal ganglia are otherwise unchanged. No hydrocephalus. The basilar cisterns are patent. No evidence of territorial infarct or acute ischemia. No extra-axial or intracranial fluid collection. Vascular: Atherosclerosis of skullbase vasculature without hyperdense vessel or abnormal calcification. Skull: No fracture or focal lesion. Sinuses/Orbits: Paranasal sinuses and mastoid air cells are clear. The visualized orbits are unremarkable. Bilateral cataract resection. Other: None. IMPRESSION: 1. No acute intracranial abnormality. 2. Remote lacunar infarcts in the left basal ganglia, left pons, and left caudate. Electronically Signed   By: Keith Rake M.D.   On: 08/27/2020 21:15   MR BRAIN WO CONTRAST  Result Date: 08/28/2020 CLINICAL DATA:  Neuro deficit, acute, stroke suspected headache, HTN, blurred vision EXAM: MRI HEAD WITHOUT CONTRAST TECHNIQUE: Multiplanar, multiecho pulse sequences of the brain and surrounding structures were obtained without intravenous contrast. COMPARISON:  Brain  MRI 10/07/2013 FINDINGS: Brain: No acute infarct, mass effect or extra-axial collection. No acute or chronic hemorrhage. There is multifocal hyperintense T2-weighted signal within the white matter. Generalized volume loss without a clear lobar predilection. Old left basal ganglia and pontine infarcts. The midline structures are normal. Vascular: Major flow voids are preserved. Skull and upper cervical spine: Normal calvarium and skull base. Visualized upper cervical spine and soft tissues are normal. Sinuses/Orbits:No paranasal sinus fluid levels or advanced mucosal thickening. No mastoid or middle ear effusion. Normal orbits. IMPRESSION: 1. No acute intracranial abnormality. 2. Old left basal ganglia and pontine infarcts. Electronically Signed   By: Ulyses Jarred M.D.   On: 08/28/2020 02:05   CARDIAC CATHETERIZATION  Result Date: 09/05/2020 Images from the original result were not included. LM: Normal LAD: Distal apical 70% stenosis (non-culprit, very small caliber) Lcx: Mid Lcx 30% disease with just after a small OM1 branch (non-culprit)        Prox OM2 with 95% stenosis (culprit) RCA: Minimal luminalr irregularities LVEF 50-55% with apical inferolateral hypokinesis Successful percutaneous coronary intervention OM2        PTCA and stent placement 2.25 X 12 mm Onyx drug-eluting stent        IVUS guided Post dilatation using 2.25 mm [place] at 22 atm and 2.5 mm [place] at 16 atm Nigel Mormon, MD Pager: [phone] Office: [phone]          ECHOCARDIOGRAM COMPLETE  Result Date: 09/06/2020    ECHOCARDIOGRAM REPORT  Patient Name:   Tina [name] Date of Exam: 09/06/2020 Medical Rec #:  [id number]          Height:       66.0 in Accession #:    [id number]         Weight:       255.0 lb Date of Birth:  [birth year]           BSA:          2.217 m Patient Age:    61 years           BP:           148/75 mmHg Patient Gender: F                  HR:           76 bpm. Exam Location:  Inpatient Procedure: 2D Echo,  Cardiac Doppler and Color Doppler Indications:     R07.9* Chest pain, unspecified  History:         Patient has no prior history of Echocardiogram examinations.                  Stroke, Signs/Symptoms:Dyspnea; Risk Factors:Hypertension,                  Diabetes and GERD.  Sonographer:     Jonelle Sidle Dance RVT Referring Phys:  [id number] Rex Kras Diagnosing Phys: Rex Kras DO IMPRESSIONS  1. Left ventricular ejection fraction, by estimation, is 60 to 65%. The left ventricle has normal function. The left ventricle has no regional wall motion abnormalities. There is mild left ventricular hypertrophy. Left ventricular diastolic parameters are indeterminate. Elevated left atrial pressure.  2. Right ventricular systolic function is normal. The right ventricular size is normal.  3. The mitral valve is grossly normal. No evidence of mitral valve regurgitation. No evidence of mitral stenosis.  4. The aortic valve is tricuspid. Aortic valve regurgitation is not visualized. Mild aortic valve sclerosis is present, with no evidence of aortic valve stenosis.  5. The inferior vena cava is normal in size with greater than 50% respiratory variability, suggesting right atrial pressure of 3 mmHg. FINDINGS  Left Ventricle: Left ventricular ejection fraction, by estimation, is 60 to 65%. The left ventricle has normal function. The left ventricle has no regional wall motion abnormalities. The left ventricular internal cavity size was normal in size. There is  mild left ventricular hypertrophy. Left ventricular diastolic parameters are indeterminate. Elevated left atrial pressure. Right Ventricle: The right ventricular size is normal. No increase in right ventricular wall thickness. Right ventricular systolic function is normal. Left Atrium: Left atrial size was normal in size. Right Atrium: Right atrial size was normal in size. Pericardium: Trivial pericardial effusion is present. Mitral Valve: The mitral valve is grossly normal. No  evidence of mitral valve regurgitation. No evidence of mitral valve stenosis. Tricuspid Valve: The tricuspid valve is normal in structure. Tricuspid valve regurgitation is trivial. No evidence of tricuspid stenosis. Aortic Valve: The aortic valve is tricuspid. Aortic valve regurgitation is not visualized. Mild aortic valve sclerosis is present, with no evidence of aortic valve stenosis. Pulmonic Valve: The pulmonic valve was grossly normal. Pulmonic valve regurgitation is not visualized. No evidence of pulmonic stenosis. Aorta: The aortic root and ascending aorta are structurally normal, with no evidence of dilitation. Venous: The inferior vena cava is normal in size with greater than 50% respiratory variability, suggesting right atrial pressure of 3 mmHg. IAS/Shunts: The atrial  septum is grossly normal.  LEFT VENTRICLE PLAX 2D LVIDd:         4.90 cm  Diastology LVIDs:         3.10 cm  LV e' medial:    5.66 cm/s LV PW:         1.30 cm  LV E/e' medial:  21.6 LV IVS:        1.20 cm  LV e' lateral:   6.96 cm/s LVOT diam:     1.80 cm  LV E/e' lateral: 17.5 LV SV:         60 LV SV Index:   27 LVOT Area:     2.54 cm  RIGHT VENTRICLE             IVC RV Basal diam:  2.70 cm     IVC diam: 1.60 cm RV S prime:     12.50 cm/s TAPSE (M-mode): 2.6 cm LEFT ATRIUM             Index       RIGHT ATRIUM           Index LA diam:        4.00 cm 1.80 cm/m  RA Area:     14.90 cm LA Vol (A2C):   59.7 ml 26.92 ml/m RA Volume:   36.20 ml  16.33 ml/m LA Vol (A4C):   71.4 ml 32.20 ml/m LA Biplane Vol: 67.1 ml 30.26 ml/m  AORTIC VALVE LVOT Vmax:   104.00 cm/s LVOT Vmean:  73.600 cm/s LVOT VTI:    0.236 m  AORTA Ao Root diam: 2.60 cm Ao Asc diam:  3.50 cm MITRAL VALVE MV Area (PHT): 3.48 cm     SHUNTS MV Decel Time: 218 msec     Systemic VTI:  0.24 m MV E velocity: 122.00 cm/s  Systemic Diam: 1.80 cm MV A velocity: 122.00 cm/s MV E/A ratio:  1.00 Hans Rusher DO Electronically signed by Rex Kras DO Signature Date/Time:  09/06/2020/12:57:51 PM    Final    OCT, Retina - OU - Both Eyes  Result Date: 08/30/2020 Right Eye Quality was good. Scan locations included subfoveal. Central Foveal Thickness: 379. Progression has been stable. Findings include cystoid macular edema, abnormal foveal contour. Left Eye Quality was good. Scan locations included subfoveal. Central Foveal Thickness: 363. Progression has been stable. Findings include normal foveal contour. Notes Possible role of mild sleep apnea? CME localized to the temporal aspect of the macula, watershed area of the macular perfusion.  This could in fact be macular telangiectasis, recent diagnosis of mild sleep apnea.  Region of CNVM MAD, CSME on the temporal aspect the macula now worse likely secondary to superimposed hypertensive retinopathy that is currently uncontrolled due to medication allergy systemically.  Left eye is entirely normal and no active CME each eye with quiet PDR.     FOLLOW UP PLANS AND APPOINTMENTS Discharge Instructions     Amb Referral to Cardiac Rehabilitation   Complete by: As directed    Diagnosis:  Coronary Stents NSTEMI     After initial evaluation and assessments completed: Virtual Based Care may be provided alone or in conjunction with Phase 2 Cardiac Rehab based on patient barriers.: Yes      Allergies as of 09/06/2020       Reactions   Amlodipine Benzoate Swelling, Other (See Comments)   Leg swelling   Contrast Media [iodinated Diagnostic Agents] Shortness Of Breath, Other (See Comments)   Difficulty breathing  Iodine Anaphylaxis   Iohexol Hives, Nausea And Vomiting, Swelling, Other (See Comments)    Desc: Magnevist-gadolinium-difficulty breathing, throat swelling   Midazolam Hcl Anaphylaxis   Difficulty breathing   Other Shortness Of Breath, Itching, Other (See Comments)   Patient is allergic to all nuts except peanuts, which are NOT "nuts"- they are legumes   Shellfish Allergy Anaphylaxis   Shellfish-derived Products  Anaphylaxis   Valsartan Swelling   Metformin And Related Diarrhea, Nausea And Vomiting, Other (See Comments)   Dehydration, also   Farxiga [dapagliflozin] Nausea Only, Other (See Comments)   UTI, headaches and muscle aches, also   Hydralazine Other (See Comments)   Reaction not recalled   Saxagliptin Hives, Nausea And Vomiting, Other (See Comments)   ONGLYZA- dehydration, also   Spironolactone Other (See Comments)   Elevated potassium- Severe hyperkalemia   Avandia [rosiglitazone Maleate] Hives, Other (See Comments)   Geodon [ziprasidone] Other (See Comments)   Reaction not recalled   Kiwi Extract Itching, Swelling   Latex Itching        Medication List     TAKE these medications    albuterol 108 (90 Base) MCG/ACT inhaler Commonly known as: VENTOLIN HFA Inhale 2 puffs into the lungs every 4 (four) hours as needed for wheezing or shortness of breath.   allopurinol 100 MG tablet Commonly known as: ZYLOPRIM Take 100 mg by mouth at bedtime.   amLODipine 5 MG tablet Commonly known as: NORVASC Take 1 tablet (5 mg total) by mouth daily.   aspirin EC 81 MG tablet Take 81 mg by mouth at bedtime. Swallow whole.   atorvastatin 40 MG tablet Commonly known as: LIPITOR Take 1 tablet (40 mg total) by mouth at bedtime. What changed:  medication strength how much to take when to take this   Basaglar KwikPen 100 UNIT/ML Inject 22 Units into the skin at bedtime. What changed: how much to take   carvedilol 25 MG tablet Commonly known as: COREG Take 25 mg by mouth 2 (two) times daily.   cetirizine 10 MG tablet Commonly known as: ZYRTEC SMARTSIG:1 Tablet(s) By Mouth 1-2 Times Daily   cloNIDine 0.2 MG tablet Commonly known as: CATAPRES Take 0.4 mg by mouth 2 (two) times daily.   clopidogrel 75 MG tablet Commonly known as: Plavix Take 1 tablet (75 mg total) by mouth daily.   DULoxetine 20 MG capsule Commonly known as: CYMBALTA Take 20-40 mg by mouth See admin  instructions. Take 20 mg by mouth in the morning and 40 mg at bedtime   EPINEPHrine 0.3 mg/0.3 mL Soaj injection Commonly known as: Auvi-Q Inject 0.3 mg into the muscle as needed for anaphylaxis.   famotidine 20 MG tablet Commonly known as: PEPCID Take 1 tablet (20 mg total) by mouth 2 (two) times daily.   FreeStyle [place] 2 Reader [place] daily. as directed   FreeStyle Libre 2 Sensor Misc Inject 1 Device into the skin every 14 (fourteen) days.   furosemide 40 MG tablet Commonly known as: LASIX Take 40 mg by mouth in the morning.   gabapentin 300 MG capsule Commonly known as: NEURONTIN TAKE 2 CAPSULES BY MOUTH THREE TIMES DAILY What changed:  how much to take when to take this additional instructions   insulin aspart 100 UNIT/ML injection Commonly known as: novoLOG Inject 5-15 Units into the skin See admin instructions. Inject 5-15 units into the skin three times a day with meals, PER SLIDING SCALE   isosorbide mononitrate 30 MG 24 hr tablet Commonly known as:  IMDUR Take 1 tablet (30 mg total) by mouth daily.   omeprazole 40 MG capsule Commonly known as: PRILOSEC Take 1 capsule by mouth once daily   OZEMPIC (0.25 OR 0.5 MG/DOSE) [place] Inject 0.5 mg into the skin every Saturday.   tiZANidine 2 MG tablet Commonly known as: ZANAFLEX Take 1 tablet (2 mg total) by mouth at bedtime.   traMADol 50 MG tablet Commonly known as: ULTRAM Take 1 tablet by mouth twice daily What changed:  when to take this reasons to take this   Vitamin D (Ergocalciferol) 1.25 MG (50000 UNIT) Caps capsule Commonly known as: DRISDOL Take 1 capsule (50,000 Units total) by mouth every 7 (seven) days. What changed: when to take this        Follow-up Information     Ferris Fielden, DO Follow up on 09/13/2020.   Specialties: Cardiology, Vascular Surgery Why: 345pm bring your cardiac medication bottles. Contact information: [place] [address] [place], DO Follow up.   Specialty: Family Medicine Why: Post hospitalization. Diabetes management. Contact information: [address] [place] [address] [phone]                Did the patient have an acute coronary syndrome (MI, NSTEMI, STEMI, etc) this admission?: Yes                               AHA/ACC Clinical Performance & Quality Measures: Aspirin prescribed? - Yes ADP Receptor Inhibitor (Plavix/Clopidogrel, Brilinta/Ticagrelor or Effient/Prasugrel) prescribed (includes medically managed patients)? - Yes Beta Blocker prescribed? - Yes High Intensity Statin (Lipitor 40-30m or Crestor 20-46m prescribed? - Yes EF assessed during THIS hospitalization? - Yes For EF <40%, was ACEI/ARB prescribed? - Not Applicable (EF >/= 4015%allergic to ACE inhibitors and ARB. For EF <40%, Aldosterone Antagonist (Spironolactone or Eplerenone) prescribed? -Reports allergy to spironolactone Cardiac Rehab Phase II ordered (including medically managed patients)? - Yes   Total time spent: 40 minutes.  SuRex KrasDONevadaFADearborn Surgery Center LLC Dba Dearborn Surgery CenterPager: [phone]: [phone]

## 2020-09-09 ENCOUNTER — Inpatient Hospital Stay (HOSPITAL_COMMUNITY)
Admission: EM | Admit: 2020-09-09 | Discharge: 2020-09-11 | DRG: 281 | Disposition: A | Discharge status: Home / Self Care | Payer: Federal, State, Local not specified - PPO | Attending: Internal Medicine | Admitting: Internal Medicine

## 2020-09-09 ENCOUNTER — Emergency Department (HOSPITAL_COMMUNITY): Payer: Federal, State, Local not specified - PPO

## 2020-09-09 ENCOUNTER — Encounter (HOSPITAL_COMMUNITY): Payer: Self-pay | Admitting: Emergency Medicine

## 2020-09-09 DIAGNOSIS — G4733 Obstructive sleep apnea (adult) (pediatric): Secondary | ICD-10-CM | POA: Diagnosis not present

## 2020-09-09 DIAGNOSIS — Z7902 Long term (current) use of antithrombotics/antiplatelets: Secondary | ICD-10-CM

## 2020-09-09 DIAGNOSIS — G2581 Restless legs syndrome: Secondary | ICD-10-CM | POA: Diagnosis present

## 2020-09-09 DIAGNOSIS — Z9989 Dependence on other enabling machines and devices: Secondary | ICD-10-CM | POA: Diagnosis not present

## 2020-09-09 DIAGNOSIS — Z20822 Contact with and (suspected) exposure to covid-19: Secondary | ICD-10-CM | POA: Diagnosis present

## 2020-09-09 DIAGNOSIS — F419 Anxiety disorder, unspecified: Secondary | ICD-10-CM | POA: Diagnosis present

## 2020-09-09 DIAGNOSIS — Z955 Presence of coronary angioplasty implant and graft: Secondary | ICD-10-CM

## 2020-09-09 DIAGNOSIS — E7849 Other hyperlipidemia: Secondary | ICD-10-CM | POA: Diagnosis not present

## 2020-09-09 DIAGNOSIS — Z8673 Personal history of transient ischemic attack (TIA), and cerebral infarction without residual deficits: Secondary | ICD-10-CM

## 2020-09-09 DIAGNOSIS — F32A Depression, unspecified: Secondary | ICD-10-CM | POA: Diagnosis present

## 2020-09-09 DIAGNOSIS — K3184 Gastroparesis: Secondary | ICD-10-CM | POA: Diagnosis present

## 2020-09-09 DIAGNOSIS — Z6841 Body Mass Index (BMI) 40.0 and over, adult: Secondary | ICD-10-CM

## 2020-09-09 DIAGNOSIS — E119 Type 2 diabetes mellitus without complications: Secondary | ICD-10-CM

## 2020-09-09 DIAGNOSIS — Z8349 Family history of other endocrine, nutritional and metabolic diseases: Secondary | ICD-10-CM

## 2020-09-09 DIAGNOSIS — I214 Non-ST elevation (NSTEMI) myocardial infarction: Secondary | ICD-10-CM | POA: Diagnosis present

## 2020-09-09 DIAGNOSIS — K219 Gastro-esophageal reflux disease without esophagitis: Secondary | ICD-10-CM | POA: Diagnosis not present

## 2020-09-09 DIAGNOSIS — I161 Hypertensive emergency: Principal | ICD-10-CM | POA: Diagnosis present

## 2020-09-09 DIAGNOSIS — E876 Hypokalemia: Secondary | ICD-10-CM | POA: Diagnosis not present

## 2020-09-09 DIAGNOSIS — E1143 Type 2 diabetes mellitus with diabetic autonomic (poly)neuropathy: Secondary | ICD-10-CM | POA: Diagnosis present

## 2020-09-09 DIAGNOSIS — Z800 Family history of malignant neoplasm of digestive organs: Secondary | ICD-10-CM

## 2020-09-09 DIAGNOSIS — E1165 Type 2 diabetes mellitus with hyperglycemia: Secondary | ICD-10-CM | POA: Diagnosis present

## 2020-09-09 DIAGNOSIS — I517 Cardiomegaly: Secondary | ICD-10-CM | POA: Diagnosis not present

## 2020-09-09 DIAGNOSIS — E559 Vitamin D deficiency, unspecified: Secondary | ICD-10-CM | POA: Diagnosis present

## 2020-09-09 DIAGNOSIS — I2510 Atherosclerotic heart disease of native coronary artery without angina pectoris: Secondary | ICD-10-CM | POA: Diagnosis present

## 2020-09-09 DIAGNOSIS — I5032 Chronic diastolic (congestive) heart failure: Secondary | ICD-10-CM | POA: Diagnosis not present

## 2020-09-09 DIAGNOSIS — Z91040 Latex allergy status: Secondary | ICD-10-CM

## 2020-09-09 DIAGNOSIS — E1140 Type 2 diabetes mellitus with diabetic neuropathy, unspecified: Secondary | ICD-10-CM | POA: Diagnosis not present

## 2020-09-09 DIAGNOSIS — G459 Transient cerebral ischemic attack, unspecified: Secondary | ICD-10-CM | POA: Diagnosis not present

## 2020-09-09 DIAGNOSIS — I110 Hypertensive heart disease with heart failure: Secondary | ICD-10-CM | POA: Diagnosis not present

## 2020-09-09 DIAGNOSIS — Z823 Family history of stroke: Secondary | ICD-10-CM

## 2020-09-09 DIAGNOSIS — I21A1 Myocardial infarction type 2: Secondary | ICD-10-CM | POA: Diagnosis present

## 2020-09-09 DIAGNOSIS — Z841 Family history of disorders of kidney and ureter: Secondary | ICD-10-CM

## 2020-09-09 DIAGNOSIS — Z888 Allergy status to other drugs, medicaments and biological substances status: Secondary | ICD-10-CM

## 2020-09-09 DIAGNOSIS — R0789 Other chest pain: Secondary | ICD-10-CM | POA: Diagnosis not present

## 2020-09-09 DIAGNOSIS — R079 Chest pain, unspecified: Secondary | ICD-10-CM | POA: Diagnosis not present

## 2020-09-09 DIAGNOSIS — Z794 Long term (current) use of insulin: Secondary | ICD-10-CM | POA: Diagnosis not present

## 2020-09-09 DIAGNOSIS — Z833 Family history of diabetes mellitus: Secondary | ICD-10-CM

## 2020-09-09 DIAGNOSIS — I10 Essential (primary) hypertension: Secondary | ICD-10-CM | POA: Diagnosis not present

## 2020-09-09 DIAGNOSIS — Z79899 Other long term (current) drug therapy: Secondary | ICD-10-CM

## 2020-09-09 DIAGNOSIS — Z7982 Long term (current) use of aspirin: Secondary | ICD-10-CM

## 2020-09-09 LAB — CBC WITH DIFFERENTIAL/PLATELET
Abs Immature Granulocytes: 0.02 10*3/uL (ref 0.00–0.07)
Basophils Absolute: 0.1 10*3/uL (ref 0.0–0.1)
Basophils Relative: 1 %
Eosinophils Absolute: 0.2 10*3/uL (ref 0.0–0.5)
Eosinophils Relative: 3 %
HCT: 39.7 % (ref 36.0–46.0)
Hemoglobin: 12.9 g/dL (ref 12.0–15.0)
Immature Granulocytes: 0 %
Lymphocytes Relative: 18 %
Lymphs Abs: 1.4 10*3/uL (ref 0.7–4.0)
MCH: 27.2 pg (ref 26.0–34.0)
MCHC: 32.5 g/dL (ref 30.0–36.0)
MCV: 83.8 fL (ref 80.0–100.0)
Monocytes Absolute: 0.7 10*3/uL (ref 0.1–1.0)
Monocytes Relative: 9 %
Neutro Abs: 5.3 10*3/uL (ref 1.7–7.7)
Neutrophils Relative %: 69 %
Platelets: 364 10*3/uL (ref 150–400)
RBC: 4.74 MIL/uL (ref 3.87–5.11)
RDW: 13.1 % (ref 11.5–15.5)
WBC: 7.6 10*3/uL (ref 4.0–10.5)
nRBC: 0 % (ref 0.0–0.2)

## 2020-09-09 LAB — TROPONIN I (HIGH SENSITIVITY)
Troponin I (High Sensitivity): 3621 ng/L (ref <18)
Troponin I (High Sensitivity): 3062 ng/L (ref <18)

## 2020-09-09 LAB — COMPREHENSIVE METABOLIC PANEL
ALT: 16 U/L (ref 0–44)
AST: 17 U/L (ref 15–41)
Albumin: 2.8 g/dL — ABNORMAL LOW (ref 3.5–5.0)
Alkaline Phosphatase: 57 U/L (ref 38–126)
Anion gap: 10 (ref 5–15)
BUN: 14 mg/dL (ref 6–20)
CO2: 26 mmol/L (ref 22–32)
Calcium: 8.9 mg/dL (ref 8.9–10.3)
Chloride: 102 mmol/L (ref 98–111)
Creatinine, Ser: 1.11 mg/dL — ABNORMAL HIGH (ref 0.44–1.00)
GFR, Estimated: >60 mL/min (ref >60)
Glucose, Bld: 111 mg/dL — ABNORMAL HIGH (ref 70–99)
Potassium: 3.3 mmol/L — ABNORMAL LOW (ref 3.5–5.1)
Sodium: 138 mmol/L (ref 135–145)
Total Bilirubin: 0.7 mg/dL (ref 0.3–1.2)
Total Protein: 6.6 g/dL (ref 6.5–8.1)

## 2020-09-09 LAB — I-STAT BETA HCG BLOOD, ED (MC, WL, AP ONLY): I-stat hCG, quantitative: <5 m[IU]/mL (ref <5)

## 2020-09-09 LAB — LIPASE, BLOOD: Lipase: 27 U/L (ref 11–51)

## 2020-09-09 MED ORDER — POTASSIUM CHLORIDE CRYS ER 20 MEQ PO TBCR
40.0000 meq | EXTENDED_RELEASE_TABLET | Freq: Once | ORAL | Status: AC
  Administered 2020-09-10: 40 meq via ORAL
  Filled 2020-09-09: qty 2

## 2020-09-09 MED ORDER — ATORVASTATIN CALCIUM 40 MG PO TABS
40.0000 mg | ORAL_TABLET | Freq: Every day | ORAL | Status: DC
  Administered 2020-09-10: 40 mg via ORAL
  Filled 2020-09-09 (×2): qty 1

## 2020-09-09 MED ORDER — LABETALOL HCL 5 MG/ML IV SOLN
10.0000 mg | Freq: Once | INTRAVENOUS | Status: AC
  Administered 2020-09-09: 10 mg via INTRAVENOUS
  Filled 2020-09-09: qty 4

## 2020-09-09 MED ORDER — PANTOPRAZOLE SODIUM 40 MG PO TBEC
40.0000 mg | DELAYED_RELEASE_TABLET | Freq: Every day | ORAL | Status: DC
  Administered 2020-09-10 – 2020-09-11 (×2): 40 mg via ORAL
  Filled 2020-09-09 (×2): qty 1

## 2020-09-09 MED ORDER — HEPARIN SODIUM (PORCINE) 5000 UNIT/ML IJ SOLN
5000.0000 [IU] | Freq: Three times a day (TID) | INTRAMUSCULAR | Status: DC
  Administered 2020-09-10 – 2020-09-11 (×3): 5000 [IU] via SUBCUTANEOUS
  Filled 2020-09-09 (×3): qty 1

## 2020-09-09 MED ORDER — CLONIDINE HCL 0.2 MG PO TABS
0.2000 mg | ORAL_TABLET | Freq: Once | ORAL | Status: AC
  Administered 2020-09-09: 0.2 mg via ORAL
  Filled 2020-09-09: qty 1

## 2020-09-09 MED ORDER — ACETAMINOPHEN 325 MG PO TABS: 650.0000 mg | ORAL_TABLET | Freq: Four times a day (QID) | ORAL | Status: DC | PRN

## 2020-09-09 MED ORDER — FUROSEMIDE 40 MG PO TABS
40.0000 mg | ORAL_TABLET | Freq: Every day | ORAL | Status: DC
  Administered 2020-09-10 – 2020-09-11 (×2): 40 mg via ORAL
  Filled 2020-09-09: qty 1
  Filled 2020-09-09: qty 2

## 2020-09-09 MED ORDER — ONDANSETRON HCL 4 MG/2ML IJ SOLN
4.0000 mg | Freq: Once | INTRAMUSCULAR | Status: AC
  Administered 2020-09-09: 4 mg via INTRAVENOUS
  Filled 2020-09-09: qty 2

## 2020-09-09 MED ORDER — AMLODIPINE BESYLATE 5 MG PO TABS
5.0000 mg | ORAL_TABLET | Freq: Every day | ORAL | Status: DC
  Administered 2020-09-10 – 2020-09-11 (×2): 5 mg via ORAL
  Filled 2020-09-09 (×2): qty 1

## 2020-09-09 MED ORDER — CLOPIDOGREL BISULFATE 75 MG PO TABS
75.0000 mg | ORAL_TABLET | Freq: Every day | ORAL | Status: DC
  Administered 2020-09-10 – 2020-09-11 (×2): 75 mg via ORAL
  Filled 2020-09-09 (×2): qty 1

## 2020-09-09 MED ORDER — POLYETHYLENE GLYCOL 3350 17 G PO PACK: 17.0000 g | PACK | Freq: Every day | ORAL | Status: DC | PRN

## 2020-09-09 MED ORDER — ASPIRIN EC 81 MG PO TBEC
81.0000 mg | DELAYED_RELEASE_TABLET | Freq: Every day | ORAL | Status: DC
  Administered 2020-09-10 (×2): 81 mg via ORAL
  Filled 2020-09-09 (×2): qty 1

## 2020-09-09 MED ORDER — ACETAMINOPHEN 650 MG RE SUPP: 650.0000 mg | Freq: Four times a day (QID) | RECTAL | Status: DC | PRN

## 2020-09-09 MED ORDER — ACETAMINOPHEN 325 MG PO TABS
650.0000 mg | ORAL_TABLET | Freq: Once | ORAL | Status: AC
  Administered 2020-09-09: 650 mg via ORAL
  Filled 2020-09-09: qty 2

## 2020-09-09 MED ORDER — CLONIDINE HCL 0.2 MG PO TABS
0.4000 mg | ORAL_TABLET | Freq: Two times a day (BID) | ORAL | Status: DC
  Administered 2020-09-10 – 2020-09-11 (×4): 0.4 mg via ORAL
  Filled 2020-09-09 (×4): qty 2

## 2020-09-09 MED ORDER — SODIUM CHLORIDE 0.9% FLUSH
3.0000 mL | Freq: Two times a day (BID) | INTRAVENOUS | Status: DC
  Administered 2020-09-10 – 2020-09-11 (×4): 3 mL via INTRAVENOUS

## 2020-09-09 MED ORDER — ISOSORBIDE MONONITRATE ER 30 MG PO TB24
30.0000 mg | ORAL_TABLET | Freq: Every day | ORAL | Status: DC
  Administered 2020-09-10 – 2020-09-11 (×2): 30 mg via ORAL
  Filled 2020-09-09 (×2): qty 1

## 2020-09-09 NOTE — ED Triage Notes (Signed)
Pt here from home with c/o left sided chest pain along with numbness to the left face arm and chest , pt was a NSTEMI and had a stent placed 4 days ago ,

## 2020-09-09 NOTE — ED Provider Notes (Signed)
[place] EMERGENCY DEPARTMENT Provider Note   CSN: [id number] Arrival date & time: 09/09/20  1755     History No chief complaint on file.   Tina [name] is a 50 y.o. female.  HPI Patient presents feeling bad.  Has had some nausea and vomiting.  Also some left-sided chest pain.  Some numbness to the left chest left hand and left arm.  States this has been going for a day now.  2 days ago discharged after a non-STEMI.  States she was doing well.  Now has headache.  States she has been vomiting up her medicines.  States when she was having symptoms previously it was right-sided symptoms.    Past Medical History:  Diagnosis Date   Allergy    Anemia    DURING MENSES--HAS HEAVY BLEEDING WITH PERODS   Angioedema 08/13/2020   Anxiety    Back pain, chronic    "ongoing"   Blood transfusion    IN 2012  AFTER C -SECTION   Cerebral thrombosis with cerebral infarction [facility]) JUNE 2011   RIGHT SIDED WEAKNESS ( ARM AND LEG ) AND SPASMS-remains with slight weakness and vertigo.   Constipation    Depression    Diabetes mellitus    Diabetic neuropathy (HCC)    BOTH FEET --COMES AND GOES   Edema, lower extremity    Endometriosis    Fatty liver    GERD (gastroesophageal reflux disease)    with pregnancy   H/O eye surgery    Headache(784.0)    MIGRAINES--NOT REALLY HEADACHE-MORE LIKE PRESSURE SENSATION IN HEAD-FEELS DIZZIY AND  FAINT AS THE PRESSURE RESOLVES   Hernia, incisional, RLQ, s/p lap repair Sep 2013 09/02/2011   History of vertigo 03/22/2018   Hx of migraines 10/19/2011   Hypertension    Leg pain, right    "like bad Charley horse"   Multiple food allergies    Panic disorder without agoraphobia    Rash    HANDS, ARMS --STATES HX OF RASH EVER SINCE CHILDBIRTH/PREGNANCY.  STATES THE RASH OFTEN OCCURS WHEN SHE IS REALLY STRESSED."goes and comes-presently left ring finger"   Restless leg syndrome    DIAGNOSED BY SLEEP STUDY - PT TOLD SHE DID NOT HAVE SLEEP  APNEA   Right rotator cuff tear    PAIN IN RIGHT SHOULDER   SBO (small bowel obstruction) ([place]) 06/03/2012   Shortness of breath    Spastic hemiplegia affecting dominant side (HCC)    Stomach problems    Stroke (HCC)    Ventral hernia    RIGHT LOWER QUADRANT-CAUSING SOME PAIN   Weakness of right side of body     Patient Active Problem List   Diagnosis Date Noted   Chest pain    Elevated troponin    Status post coronary artery stent placement    Non-ST elevation (NSTEMI) myocardial infarction ([place]) 09/05/2020   NSTEMI (non-ST elevated myocardial infarction) ([place]) 09/05/2020   Hypersomnia, persistent 09/04/2020   OSA on CPAP 09/04/2020   Shortness of breath 08/18/2020   Allergy to alpha-gal 08/18/2020   Seasonal and perennial allergic rhinitis 08/18/2020   Allergy with anaphylaxis due to food 08/18/2020   Pruritus 08/18/2020   Pollen-food allergy 08/18/2020   Allergic reaction 08/13/2020   Angioedema 08/13/2020   Type 2 macular telangiectasis, bilateral 01/03/2020   Excessive daytime sleepiness 09/12/2019   Sleep paralysis 09/12/2019   Hypnopompic hallucination 09/12/2019   Abnormal dreams 09/12/2019   Diabetic macular edema of right eye with proliferative retinopathy  associated with type 2 diabetes mellitus ([place]) 07/04/2019   Stable treated proliferative diabetic retinopathy of left eye determined by examination associated with type 2 diabetes mellitus ([place]) 07/04/2019   Long-term insulin use ([place]) 04/07/2019   Depression 04/13/2018   History of vertigo 03/22/2018   Laboratory examination 03/22/2018   Chronic diastolic (congestive) heart failure ([place]) 03/22/2018   Other hyperlipidemia 02/15/2018   Vitamin D deficiency 12/31/2017   Class 2 severe obesity with serious comorbidity and body mass index (BMI) of 39.0 to 39.9 in adult ([place]) 11/18/2017   Gait disturbance, post-stroke 04/23/2017   Strain of gluteus medius 04/23/2017   OSA (obstructive sleep apnea) 10/17/2015    Periodic limb movement disorder (PLMD) 10/17/2015   Essential hypertension 05/26/2015   Diabetes mellitus ([place]) 05/26/2015   Abdominal wall mass of right lower quadrant 05/25/2015   Hemiparesis affecting right side as late effect of stroke ([place]) 11/09/2014   Abdominal wall seroma    Sepsis due to undetermined organism ([place]) 03/06/2014   Nausea vomiting and diarrhea 03/06/2014   Sepsis ([place]) 03/06/2014   Tension headache 09/01/2013   Nausea with vomiting ?Gastroparesis? 03/21/2013   Spastic hemiplegia affecting dominant side ([place]) 02/16/2013   Recurrent ventral incisional hernia s/p closure/repair w mesh 02/04/2013 01/26/2013   Constipation, chronic 01/26/2013   Abdominal pain, chronic, right lower quadrant 06/03/2012   History of CVA (cerebrovascular accident) 06/03/2012   HTN (hypertension) 06/03/2012   Diabetes mellitus type II, uncontrolled ([place]) 10/16/2011   Obesity (BMI 30-39.9) 09/02/2011   Rotator cuff tear 08/25/2011   Sciatica 06/10/2011   Paresthesias in right hand 06/10/2011   Lumbago 05/09/2011   Paresthesias 05/09/2011    Past Surgical History:  Procedure Laterality Date   APPLICATION OF WOUND VAC N/A 05/25/2015   Procedure: APPLICATION OF WOUND VAC;  Surgeon: Michael Boston, MD;  Location: [facility];  Service: General;  Laterality: N/A;   CESAREAN SECTION  2012   COLONOSCOPY     CORONARY STENT INTERVENTION N/A 09/05/2020   Procedure: CORONARY STENT INTERVENTION;  Surgeon: Nigel Mormon, MD;  Location: [place] CV LAB;  Service: Cardiovascular;  Laterality: N/A;   DIAGNOSTIC LAPAROSCOPY     ESOPHAGOGASTRODUODENOSCOPY N/A 06/03/2012   Procedure: ESOPHAGOGASTRODUODENOSCOPY (EGD);  Surgeon: [name] Craver, MD;  Location: [facility] ENDOSCOPY;  Service: Endoscopy;  Laterality: N/A;   EXCISION MASS ABDOMINAL N/A 05/25/2015   Procedure: ABDOMINAL WALL EXPLORATION EXCISION OF SEROMA REMOVAL OF REDUNDANT SKIN ;  Surgeon: Michael Boston, MD;  Location: [facility];  Service: General;   Laterality: N/A;   EYE SURGERY     Eye laser for vessel hemorrhaging   fybroid removal     HERNIA REPAIR  10/03/11   ventral hernia repair   INSERTION OF MESH N/A 02/04/2013   Procedure: INSERTION OF MESH;  Surgeon: Adin Hector, MD;  Location: [facility];  Service: General;  Laterality: N/A;   INTRAVASCULAR ULTRASOUND/IVUS N/A 09/05/2020   Procedure: Intravascular Ultrasound/IVUS;  Surgeon: Nigel Mormon, MD;  Location: [place] CV LAB;  Service: Cardiovascular;  Laterality: N/A;   LEFT HEART CATH AND CORONARY ANGIOGRAPHY N/A 09/05/2020   Procedure: LEFT HEART CATH AND CORONARY ANGIOGRAPHY;  Surgeon: Nigel Mormon, MD;  Location: [place] CV LAB;  Service: Cardiovascular;  Laterality: N/A;   UMBILICAL HERNIA REPAIR N/A 02/04/2013   Procedure: LAPAROSCOPIC ventral wall hernia repair LAPAROSCOPIC LYSIS OF ADHESIONS laparoscopic exploration of abdomen ;  Surgeon: Adin Hector, MD;  Location: [facility];  Service: General;  Laterality: N/A;  UPPER GASTROINTESTINAL ENDOSCOPY     URETER REVISION     Bilateral "twisted"   UTERINE FIBROID SURGERY     2 SURGERIES FOR FIBROIDS   VENTRAL HERNIA REPAIR  10/03/2011   Procedure: LAPAROSCOPIC VENTRAL HERNIA;  Surgeon: Adin Hector, MD;  Location: [facility];  Service: General;  Laterality: N/A;     OB History     Gravida  2   Para  2   Term  0   Preterm  1   AB  0   Living  1      SAB  0   IAB  0   Ectopic  0   Multiple  0   Live Births              Family History  Problem Relation Age of Onset   Diabetes Father    Kidney disease Father    Depression Father    Drug abuse Father    Allergic rhinitis Mother    Eczema Mother    Urticaria Mother    Depression Mother    Anxiety disorder Mother    Bipolar disorder Mother    Alcoholism Mother    Drug abuse Mother    Eating disorder Mother    Diabetes Maternal Grandmother    Hyperlipidemia Paternal Grandmother    Stroke Paternal Grandmother    Eczema  Sister    Urticaria Sister    Colon cancer Paternal Uncle    Other Neg Hx    Angioedema Neg Hx    Asthma Neg Hx    Colon polyps Neg Hx    Esophageal cancer Neg Hx    Rectal cancer Neg Hx    Stomach cancer Neg Hx     Social History   Tobacco Use   Smoking status: Never   Smokeless tobacco: Never  Vaping Use   Vaping Use: Never used  Substance Use Topics   Alcohol use: No    Alcohol/week: 0.0 standard drinks   Drug use: No    Home Medications Prior to Admission medications   Medication Sig Start Date End Date Taking? Authorizing Provider  albuterol (VENTOLIN HFA) 108 (90 Base) MCG/ACT inhaler Inhale 2 puffs into the lungs every 4 (four) hours as needed for wheezing or shortness of breath. 08/16/20   Dara Hoyer, FNP  allopurinol (ZYLOPRIM) 100 MG tablet Take 100 mg by mouth at bedtime. 03/13/14   [provider]  amLODipine (NORVASC) 5 MG tablet Take 1 tablet (5 mg total) by mouth daily. 09/06/20 12/05/20  Tolia, Sunit, DO  aspirin EC 81 MG tablet Take 81 mg by mouth at bedtime. Swallow whole.    [provider]  atorvastatin (LIPITOR) 40 MG tablet Take 1 tablet (40 mg total) by mouth at bedtime. 09/06/20 12/05/20  Tolia, Sunit, DO  carvedilol (COREG) 25 MG tablet Take 25 mg by mouth 2 (two) times daily. 04/02/20   [provider]  cetirizine (ZYRTEC) 10 MG tablet SMARTSIG:1 Tablet(s) By Mouth 1-2 Times Daily 08/16/20   [provider]  cloNIDine (CATAPRES) 0.2 MG tablet Take 0.4 mg by mouth 2 (two) times daily. 08/13/19   [provider]  clopidogrel (PLAVIX) 75 MG tablet Take 1 tablet (75 mg total) by mouth daily. 09/05/20 09/05/21  Patwardhan, Reynold Bowen, MD  Continuous Blood Gluc Receiver (FREESTYLE LIBRE 2 READER) DEVI daily. as directed 04/13/19   [provider]  Continuous Blood Gluc Sensor (FREESTYLE LIBRE 2 SENSOR) MISC Inject 1 Device into the  skin every 14 (fourteen) days. 08/23/19   [provider]  DULoxetine  (CYMBALTA) 20 MG capsule Take 20-40 mg by mouth See admin instructions. Take 20 mg by mouth in the morning and 40 mg at bedtime    [provider]  EPINEPHrine (AUVI-Q) 0.3 mg/0.3 mL IJ SOAJ injection Inject 0.3 mg into the muscle as needed for anaphylaxis. 08/16/20   Dara Hoyer, FNP  famotidine (PEPCID) 20 MG tablet Take 1 tablet (20 mg total) by mouth 2 (two) times daily. 08/16/20   Dara Hoyer, FNP  furosemide (LASIX) 40 MG tablet Take 40 mg by mouth in the morning.    [provider]  gabapentin (NEURONTIN) 300 MG capsule TAKE 2 CAPSULES BY MOUTH THREE TIMES DAILY Patient taking differently: Take 300-600 mg by mouth 2 (two) times daily. 1 capsule in the morning and 2 capsule at bedtime as needed 08/15/19   Kirsteins, Luanna Salk, MD  insulin aspart (NOVOLOG) 100 UNIT/ML injection Inject 5-15 Units into the skin See admin instructions. Inject 5-15 units into the skin three times a day with meals, PER SLIDING SCALE    [provider]  Insulin Glargine (BASAGLAR KWIKPEN) 100 UNIT/ML Inject 22 Units into the skin at bedtime. Patient taking differently: Inject 18 Units into the skin at bedtime. 08/15/20   Domenic Polite, MD  isosorbide mononitrate (IMDUR) 30 MG 24 hr tablet Take 1 tablet (30 mg total) by mouth daily. 09/06/20 10/06/20  Rex Kras, DO  omeprazole (PRILOSEC) 40 MG capsule Take 1 capsule by mouth once daily 02/09/19   Armbruster, Carlota Raspberry, MD  Semaglutide (OZEMPIC, 0.25 OR 0.5 MG/DOSE, [place]) Inject 0.5 mg into the skin every Saturday.    [provider]  tiZANidine (ZANAFLEX) 2 MG tablet Take 1 tablet (2 mg total) by mouth at bedtime. 09/03/20   Kirsteins, Luanna Salk, MD  traMADol (ULTRAM) 50 MG tablet Take 1 tablet by mouth twice daily Patient taking differently: Take 50 mg by mouth as needed. 05/01/20   Kirsteins, Luanna Salk, MD  Vitamin D, Ergocalciferol, (DRISDOL) 1.25 MG (50000 UNIT) CAPS capsule Take 1 capsule (50,000 Units total) by mouth every 7 (seven)  days. Patient taking differently: Take 50,000 Units by mouth every Friday. 08/10/19   Dennard Nip D, MD  diphenhydrAMINE (BENADRYL) 25 MG tablet Take 1 tablet (25 mg total) by mouth every 8 (eight) hours as needed for up to 2 days for itching. 08/14/20 08/15/20  Domenic Polite, MD    Allergies    Amlodipine benzoate, Contrast media [iodinated diagnostic agents], Iodine, Iohexol, Midazolam hcl, Other, Shellfish allergy, Shellfish-derived products, Valsartan, Metformin and related, Farxiga [dapagliflozin], Hydralazine, Saxagliptin, Spironolactone, Avandia [rosiglitazone maleate], Geodon [ziprasidone], Kiwi extract, and Latex  Review of Systems   Review of Systems  Constitutional:  Negative for appetite change.  Eyes:  Negative for photophobia.  Respiratory:  Negative for shortness of breath.   Cardiovascular:  Positive for chest pain.  Gastrointestinal:  Negative for abdominal distention.  Endocrine: Negative for polyuria.  Genitourinary:  Negative for flank pain.  Musculoskeletal:  Negative for back pain.  Skin:  Negative for pallor.  Neurological:  Positive for numbness and headaches. Negative for weakness.  Psychiatric/Behavioral:  Negative for confusion.    Physical Exam Updated Vital Signs BP (!) 175/72   Pulse 81   Resp 18   SpO2 96%   Physical Exam Vitals and nursing note reviewed.  Constitutional:      Appearance: She is obese.  HENT:  Head: Atraumatic.     Comments: Tenderness to left face and neck. Eyes:     Extraocular Movements: Extraocular movements intact.  Cardiovascular:     Rate and Rhythm: Regular rhythm.  Chest:     Chest wall: Tenderness present.  Abdominal:     Tenderness: There is no abdominal tenderness.  Musculoskeletal:     Cervical back: Neck supple.     Comments: Tenderness left upper chest and left upper extremity.  Skin:    General: Skin is warm.  Neurological:     Mental Status: She is alert and oriented to person, place, and time.      Comments: Good grip strength bilaterally.  Potential mild paresthesias on left face and left arm.    ED Results / Procedures / Treatments   Labs (all labs ordered are listed, but only abnormal results are displayed) Labs Reviewed  COMPREHENSIVE METABOLIC PANEL - Abnormal; Notable for the following components:      Result Value   Potassium 3.3 (*)    Glucose, Bld 111 (*)    Creatinine, Ser 1.11 (*)    Albumin 2.8 (*)    All other components within normal limits  TROPONIN I (HIGH SENSITIVITY) - Abnormal; Notable for the following components:   Troponin I (High Sensitivity) 3,621 (*)    All other components within normal limits  CBC WITH DIFFERENTIAL/PLATELET  LIPASE, BLOOD  I-STAT BETA HCG BLOOD, ED ([facility], [facility], [facility] ONLY)  TROPONIN I (HIGH SENSITIVITY)    EKG EKG Interpretation  Date/Time:  Sunday September 09 2020 18:43:15 EDT Ventricular Rate:  83 PR Interval:  140 QRS Duration: 72 QT Interval:  418 QTC Calculation: 491 R Axis:   10 Text Interpretation: Normal sinus rhythm Prolonged QT Abnormal ECG Confirmed by Davonna Belling [phone]) on 09/09/2020 8:34:07 PM  Radiology DG Chest 2 View  Result Date: 09/09/2020 CLINICAL DATA:  Chest pain. EXAM: CHEST - 2 VIEW COMPARISON:  Chest radiograph dated 09/05/2020. FINDINGS: Mild cardiomegaly. No focal consolidation, pleural effusion, or pneumothorax. No acute osseous pathology. IMPRESSION: No active cardiopulmonary disease. Electronically Signed   By: Anner Crete M.D.   On: 09/09/2020 20:39   CT HEAD WO CONTRAST (5MM)  Result Date: 09/09/2020 CLINICAL DATA:  TIA EXAM: CT HEAD WITHOUT CONTRAST TECHNIQUE: Contiguous axial images were obtained from the base of the skull through the vertex without intravenous contrast. COMPARISON:  MRI brain dated 08/28/2020 FINDINGS: Brain: No evidence of acute infarction, hemorrhage, hydrocephalus, extra-axial collection or mass lesion/mass effect. Subcortical white matter and periventricular small  vessel ischemic changes. Old left basal ganglia lacunar infarct. Vascular: Mild intracranial atherosclerosis. Skull: Normal. Negative for fracture or focal lesion. Sinuses/Orbits: The visualized paranasal sinuses are essentially clear. The mastoid air cells are unopacified. Other: None. IMPRESSION: No evidence of acute intracranial abnormality. Small vessel ischemic changes. Old left basal ganglia lacunar infarct. Electronically Signed   By: Julian Hy M.D.   On: 09/09/2020 19:58    Procedures Procedures   Medications Ordered in ED Medications  ondansetron (ZOFRAN) injection 4 mg (4 mg Intravenous Given 09/09/20 2230)  acetaminophen (TYLENOL) tablet 650 mg (650 mg Oral Given 09/09/20 2230)  labetalol (NORMODYNE) injection 10 mg (10 mg Intravenous Given 09/09/20 2230)  cloNIDine (CATAPRES) tablet 0.2 mg (0.2 mg Oral Given 09/09/20 2230)    ED Course  I have reviewed the triage vital signs and the nursing notes.  Pertinent labs & imaging results that were available during my care of the patient were reviewed by me and  considered in my medical decision making (see chart for details).    MDM Rules/Calculators/A&P                           Patient presents with chest pain facial pain headache left arm pain.  Also potential numbness of face and arm and chest.  Recent admission to the hospital for stent.  States she has been having nausea and vomiting.  Mild abdominal exam.  No real tenderness.  Zofran given.  States has been vomiting up her medicines at home however.  This could be cause for elevated blood pressure here.  Troponin elevated but decreased from what it was 4 days ago but do not know if it is trending up.  EKG stable.  Discussed with Dr. Liliane Shi, who catheterized the patient and placed a stent few days ago.  Plan is to admit to for more urgent controlled blood pressure.  If the second troponin trends up likely TOMORROW and would start heparinization.  Otherwise medicine admission  for blood pressure control.  Head CT done and reassuring.  Reviewing records appears to have some at times vague neurologic symptoms.  Negative MRI earlier this month.  Will not recheck MRI at this time.  Also aortic dissection felt less likely.  CRITICAL CARE Performed by: Davonna Belling Total critical care time: 30 minutes Critical care time was exclusive of separately billable procedures and treating other patients. Critical care was necessary to treat or prevent imminent or life-threatening deterioration. Critical care was time spent personally by me on the following activities: development of treatment plan with patient and/or surrogate as well as nursing, discussions with consultants, evaluation of patient's response to treatment, examination of patient, obtaining history from patient or surrogate, ordering and performing treatments and interventions, ordering and review of laboratory studies, ordering and review of radiographic studies, pulse oximetry and re-evaluation of patient's condition.  Final Clinical Impression(s) / ED Diagnoses Final diagnoses:  None    Rx / DC Orders ED Discharge Orders     None        Davonna Belling, MD 09/09/20 2249

## 2020-09-09 NOTE — ED Provider Notes (Signed)
Emergency Medicine Provider Triage Evaluation Note  Tina [name] , a 50 y.o. female  was evaluated in triage.  Pt complains of pain and numbness. States recent MI with stent a few days ago. Since being home cannot keep down meds due to NBNB emesis. Now having left HA, neck pain and left arm pain. Feels numbness to left face, neck and arm. Sx started 7 AM thing morning. No swelling, redness, LE edema. No facial droop, slurred speech. Feels generally weak.  Review of Systems  Positive: Left arm pain, HA, numbness, weakness Negative: Abd pain, back pain  Physical Exam  There were no vitals taken for this visit. Gen:   Awake, no distress   Head:  No facial droop, diffuse tenderness to entire left face Cardiac:  Clear Neck:  Diffuse tenderness to left neck, no paresthesias Resp:  Normal effort  MSK:   Moves extremities without difficulty Neuro:  CN 2-12 grossly intact. Decreased strength to LUE. Equal to BLE. Intact sensation to BUE. No drift Other:    Medical Decision Making  Medically screening exam initiated at 6:43 PM.  Appropriate orders placed.  Tina [name] was informed that the remainder of the evaluation will be completed by another provider, this initial triage assessment does not replace that evaluation, and the importance of remaining in the ED until their evaluation is complete.  HA, left arm pain, numbness, weakness  Not a code stroke, No LVO criteria   Zaxton Angerer A, PA-C 09/09/20 1843    Wyvonnia Dusky, MD 09/10/20 [id number]

## 2020-09-09 NOTE — H&P (Signed)
History and Physical   Tina [name] [id number] DOB: 1970/01/27 DOA: 09/09/2020  PCP: [name], DO  Patient coming from: Home  Chief Complaint: Chest pain  HPI: Tina [name] is a 50 y.o. female with medical history significant of CAD status post stenting, alpha gal allergy, diastolic CHF, obesity, diabetes, hypertension, CVA, OSA, hyperlipidemia, depression, anxiety, GERD, migraine, RLS presenting with ongoing left-sided chest pain.  Patient states that she has had some nausea and vomiting.  She was unable to keep her medications down this morning.  This led to some increasing blood pressures and later in the day she started develop chest pain in her left chest with an associated numb sensation in her left chest face and left arm.  She also describes an associated headache.  Of note she was recently admitted this past week from 8/24-8/25 for NSTEMI underwent left heart cath finding obstructive CAD and received stenting of her left marginal branch she did have some distal LAD disease but was not thought to be contribute to her symptoms and she was chest pain-free after intervention.  She also had poorly controlled hypertension was started on additional antihypertensives on discharge with amlodipine and Imdur.  Echocardiogram at that time was performed with EF 60-65%, indeterminate diastolic function and normal RV function.  Patient denies fevers, chills, shortness of breath, abdominal pain, constipation, diarrhea.   ED Course: Vital signs in the ED significant for initial blood pressure [id number] systolic with improvement to 180 with home clonidine and dose of labetalol.  Lab work-up showed CMP with potassium 3.3, creatinine stable at 1.1, glucose 111, albumin 2.8.  CBC within normal limits.  Troponin initially elevated to 3600 with repeat pending.  Lipase normal.  Respiratory panel for flu COVID pending.  Chest x-ray without acute Hilda Blades and CT head without acute abnormality.  Patient  received clonidine and labetalol in the ED as above.  Cardiology was consulted and will see the patient in the morning.  They state that if her troponin trends up to start heparin and they will consider cath tomorrow.  Review of Systems: As per HPI otherwise all other systems reviewed and are negative.  Past Medical History:  Diagnosis Date   Allergy    Anemia    DURING MENSES--HAS HEAVY BLEEDING WITH PERODS   Angioedema 08/13/2020   Anxiety    Back pain, chronic    "ongoing"   Blood transfusion    IN 2012  AFTER C -SECTION   Cerebral thrombosis with cerebral infarction [facility]) JUNE 2011   RIGHT SIDED WEAKNESS ( ARM AND LEG ) AND SPASMS-remains with slight weakness and vertigo.   Constipation    Depression    Diabetes mellitus    Diabetic neuropathy (HCC)    BOTH FEET --COMES AND GOES   Edema, lower extremity    Endometriosis    Fatty liver    GERD (gastroesophageal reflux disease)    with pregnancy   H/O eye surgery    Headache(784.0)    MIGRAINES--NOT REALLY HEADACHE-MORE LIKE PRESSURE SENSATION IN HEAD-FEELS DIZZIY AND  FAINT AS THE PRESSURE RESOLVES   Hernia, incisional, RLQ, s/p lap repair Sep 2013 09/02/2011   History of vertigo 03/22/2018   Hx of migraines 10/19/2011   Hypertension    Leg pain, right    "like bad Charley horse"   Multiple food allergies    Panic disorder without agoraphobia    Rash    HANDS, ARMS --STATES HX OF RASH EVER SINCE CHILDBIRTH/PREGNANCY.  STATES THE RASH  OFTEN OCCURS WHEN SHE IS REALLY STRESSED."goes and comes-presently left ring finger"   Restless leg syndrome    DIAGNOSED BY SLEEP STUDY - PT TOLD SHE DID NOT HAVE SLEEP APNEA   Right rotator cuff tear    PAIN IN RIGHT SHOULDER   SBO (small bowel obstruction) ([place]) 06/03/2012   Shortness of breath    Spastic hemiplegia affecting dominant side (HCC)    Stomach problems    Stroke (HCC)    Ventral hernia    RIGHT LOWER QUADRANT-CAUSING SOME PAIN   Weakness of right side of body     Past  Surgical History:  Procedure Laterality Date   APPLICATION OF WOUND VAC N/A 05/25/2015   Procedure: APPLICATION OF WOUND VAC;  Surgeon: Michael Boston, MD;  Location: [facility];  Service: General;  Laterality: N/A;   CESAREAN SECTION  2012   COLONOSCOPY     CORONARY STENT INTERVENTION N/A 09/05/2020   Procedure: CORONARY STENT INTERVENTION;  Surgeon: Nigel Mormon, MD;  Location: [place] [facility];  Service: Cardiovascular;  Laterality: N/A;   DIAGNOSTIC LAPAROSCOPY     ESOPHAGOGASTRODUODENOSCOPY N/A 06/03/2012   Procedure: ESOPHAGOGASTRODUODENOSCOPY (EGD);  Surgeon: [name] Craver, MD;  Location: [facility] ENDOSCOPY;  Service: Endoscopy;  Laterality: N/A;   EXCISION MASS ABDOMINAL N/A 05/25/2015   Procedure: ABDOMINAL WALL EXPLORATION EXCISION OF SEROMA REMOVAL OF REDUNDANT SKIN ;  Surgeon: Michael Boston, MD;  Location: [facility];  Service: General;  Laterality: N/A;   EYE SURGERY     Eye laser for vessel hemorrhaging   fybroid removal     HERNIA REPAIR  10/03/11   ventral hernia repair   INSERTION OF MESH N/A 02/04/2013   Procedure: INSERTION OF MESH;  Surgeon: Adin Hector, MD;  Location: [facility];  Service: General;  Laterality: N/A;   INTRAVASCULAR ULTRASOUND/IVUS N/A 09/05/2020   Procedure: Intravascular Ultrasound/IVUS;  Surgeon: Nigel Mormon, MD;  Location: [place] [facility];  Service: Cardiovascular;  Laterality: N/A;   LEFT HEART CATH AND CORONARY ANGIOGRAPHY N/A 09/05/2020   Procedure: LEFT HEART CATH AND CORONARY ANGIOGRAPHY;  Surgeon: Nigel Mormon, MD;  Location: [place] CV LAB;  Service: Cardiovascular;  Laterality: N/A;   UMBILICAL HERNIA REPAIR N/A 02/04/2013   Procedure: LAPAROSCOPIC ventral wall hernia repair LAPAROSCOPIC LYSIS OF ADHESIONS laparoscopic exploration of abdomen ;  Surgeon: Adin Hector, MD;  Location: [facility];  Service: General;  Laterality: N/A;   UPPER GASTROINTESTINAL ENDOSCOPY     URETER REVISION     Bilateral "twisted"   UTERINE FIBROID SURGERY      2 SURGERIES FOR FIBROIDS   VENTRAL HERNIA REPAIR  10/03/2011   Procedure: LAPAROSCOPIC VENTRAL HERNIA;  Surgeon: Adin Hector, MD;  Location[facility];  Service: General;  Laterality: N/A;    Social History  reports that she has never smoked. She has never used smokeless tobacco. She reports that she does not drink alcohol and does not use drugs.  Allergies  Allergen Reactions   Amlodipine Benzoate Swelling and Other (See Comments)    Leg swelling   Contrast Media [Iodinated Diagnostic Agents] Shortness Of Breath and Other (See Comments)    Difficulty breathing   Iodine Anaphylaxis   Iohexol Hives, Nausea And Vomiting, Swelling and Other (See Comments)     Desc: Magnevist-gadolinium-difficulty breathing, throat swelling    Midazolam Hcl Anaphylaxis    Difficulty breathing   Other Shortness Of Breath, Itching and Other (See Comments)    Patient is allergic to all nuts  except peanuts, which are NOT "nuts"- they are legumes   Shellfish Allergy Anaphylaxis   Shellfish-Derived Products Anaphylaxis   Valsartan Swelling   Metformin And Related Diarrhea, Nausea And Vomiting and Other (See Comments)    Dehydration, also   [place] [Dapagliflozin] Nausea Only and Other (See Comments)    UTI, headaches and muscle aches, also   Hydralazine Other (See Comments)    Reaction not recalled   Saxagliptin Hives, Nausea And Vomiting and Other (See Comments)    ONGLYZA- dehydration, also   Spironolactone Other (See Comments)    Elevated potassium- Severe hyperkalemia   Avandia [Rosiglitazone Maleate] Hives and Other (See Comments)   Geodon [Ziprasidone] Other (See Comments)    Reaction not recalled   Kiwi Extract Itching and Swelling   Latex Itching    Family History  Problem Relation Age of Onset   Diabetes Father    Kidney disease Father    Depression Father    Drug abuse Father    Allergic rhinitis Mother    Eczema Mother    Urticaria Mother    Depression Mother    Anxiety  disorder Mother    Bipolar disorder Mother    Alcoholism Mother    Drug abuse Mother    Eating disorder Mother    Diabetes Maternal Grandmother    Hyperlipidemia Paternal Grandmother    Stroke Paternal Grandmother    Eczema Sister    Urticaria Sister    Colon cancer Paternal Uncle    Other Neg Hx    Angioedema Neg Hx    Asthma Neg Hx    Colon polyps Neg Hx    Esophageal cancer Neg Hx    Rectal cancer Neg Hx    Stomach cancer Neg Hx   Reviewed on admission  Prior to Admission medications   Medication Sig Start Date End Date Taking? Authorizing Provider  albuterol (VENTOLIN HFA) 108 (90 Base) MCG/ACT inhaler Inhale 2 puffs into the lungs every 4 (four) hours as needed for wheezing or shortness of breath. 08/16/20   Dara Hoyer, FNP  allopurinol (ZYLOPRIM) 100 MG tablet Take 100 mg by mouth at bedtime. 03/13/14   [provider]  amLODipine (NORVASC) 5 MG tablet Take 1 tablet (5 mg total) by mouth daily. 09/06/20 12/05/20  Tolia, Sunit, DO  aspirin EC 81 MG tablet Take 81 mg by mouth at bedtime. Swallow whole.    [provider]  atorvastatin (LIPITOR) 40 MG tablet Take 1 tablet (40 mg total) by mouth at bedtime. 09/06/20 12/05/20  Tolia, Sunit, DO  carvedilol (COREG) 25 MG tablet Take 25 mg by mouth 2 (two) times daily. 04/02/20   [provider]  cetirizine (ZYRTEC) 10 MG tablet SMARTSIG:1 Tablet(s) By Mouth 1-2 Times Daily 08/16/20   [provider]  cloNIDine (CATAPRES) 0.2 MG tablet Take 0.4 mg by mouth 2 (two) times daily. 08/13/19   [provider]  clopidogrel (PLAVIX) 75 MG tablet Take 1 tablet (75 mg total) by mouth daily. 09/05/20 09/05/21  Patwardhan, Reynold Bowen, MD  Continuous Blood Gluc Receiver (FREESTYLE LIBRE 2 READER) DEVI daily. as directed 04/13/19   [provider]  Continuous Blood Gluc Sensor (FREESTYLE LIBRE 2 SENSOR) MISC Inject 1 Device into the skin every 14 (fourteen) days. 08/23/19   [provider]   DULoxetine (CYMBALTA) 20 MG capsule Take 20-40 mg by mouth See admin instructions. Take 20 mg by mouth in the morning and 40 mg at bedtime    [provider]  EPINEPHrine (AUVI-Q) 0.3 mg/0.3 mL IJ SOAJ injection Inject 0.3 mg into the muscle as needed for anaphylaxis. 08/16/20   Dara Hoyer, FNP  famotidine (PEPCID) 20 MG tablet Take 1 tablet (20 mg total) by mouth 2 (two) times daily. 08/16/20   Dara Hoyer, FNP  furosemide (LASIX) 40 MG tablet Take 40 mg by mouth in the morning.    [provider]  gabapentin (NEURONTIN) 300 MG capsule TAKE 2 CAPSULES BY MOUTH THREE TIMES DAILY Patient taking differently: Take 300-600 mg by mouth 2 (two) times daily. 1 capsule in the morning and 2 capsule at bedtime as needed 08/15/19   Kirsteins, Luanna Salk, MD  insulin aspart (NOVOLOG) 100 UNIT/ML injection Inject 5-15 Units into the skin See admin instructions. Inject 5-15 units into the skin three times a day with meals, PER SLIDING SCALE    [provider]  Insulin Glargine (BASAGLAR KWIKPEN) 100 UNIT/ML Inject 22 Units into the skin at bedtime. Patient taking differently: Inject 18 Units into the skin at bedtime. 08/15/20   Domenic Polite, MD  isosorbide mononitrate (IMDUR) 30 MG 24 hr tablet Take 1 tablet (30 mg total) by mouth daily. 09/06/20 10/06/20  Rex Kras, DO  omeprazole (PRILOSEC) 40 MG capsule Take 1 capsule by mouth once daily 02/09/19   Armbruster, Carlota Raspberry, MD  Semaglutide (OZEMPIC, 0.25 OR 0.5 MG/DOSE, [facility]) Inject 0.5 mg into the skin every Saturday.    [provider]  tiZANidine (ZANAFLEX) 2 MG tablet Take 1 tablet (2 mg total) by mouth at bedtime. 09/03/20   Kirsteins, Luanna Salk, MD  traMADol (ULTRAM) 50 MG tablet Take 1 tablet by mouth twice daily Patient taking differently: Take 50 mg by mouth as needed. 05/01/20   Kirsteins, Luanna Salk, MD  Vitamin D, Ergocalciferol, (DRISDOL) 1.25 MG (50000 UNIT) CAPS capsule Take 1 capsule (50,000 Units total) by mouth every 7  (seven) days. Patient taking differently: Take 50,000 Units by mouth every Friday. 08/10/19   Dennard Nip D, MD  diphenhydrAMINE (BENADRYL) 25 MG tablet Take 1 tablet (25 mg total) by mouth every 8 (eight) hours as needed for up to 2 days for itching. 08/14/20 08/15/20  Domenic Polite, MD    Physical Exam: Vitals:   09/09/20 2021 09/09/20 2115 09/09/20 2130 09/09/20 2230  BP: (!) 212/103 (!) 212/95 (!) 202/94 (!) 175/72  Pulse: 77 81 76 81  Resp: 16 (!) '21 10 18  '$ SpO2: 100% 100% 98% 96%   Physical Exam Constitutional:      General: She is not in acute distress.    Appearance: Normal appearance. She is obese.  HENT:     Head: Normocephalic and atraumatic.     Mouth/Throat:     Mouth: Mucous membranes are moist.     Pharynx: Oropharynx is clear.  Eyes:     Extraocular Movements: Extraocular movements intact.     Pupils: Pupils are equal, round, and reactive to light.  Cardiovascular:     Rate and Rhythm: Normal rate and regular rhythm.     Pulses: Normal pulses.     Heart sounds: Murmur heard.  Pulmonary:     Effort: Pulmonary effort is normal. No respiratory distress.     Breath sounds: Normal breath sounds.  Abdominal:     General: Bowel sounds are normal. There is no distension.     Palpations: Abdomen is soft.     Tenderness: There is no abdominal tenderness.  Musculoskeletal:        General: No  swelling or deformity.  Skin:    General: Skin is warm and dry.  Neurological:     General: No focal deficit present.     Mental Status: Mental status is at baseline.   Labs on Admission: I have personally reviewed following labs and imaging studies  CBC: Recent Labs  Lab 09/05/20 1137 09/05/20 1751 09/05/20 2104 09/06/20 0254 09/09/20 1840  WBC 6.9  --  11.7* 9.9 7.6  NEUTROABS  --   --   --   --  5.3  HGB 12.4 12.6 13.0 11.9* 12.9  HCT 38.5 37.0 38.9 35.5* 39.7  MCV 84.8  --  82.4 82.6 83.8  PLT 297  --  295 284 [id number]    Basic Metabolic Panel: Recent Labs  Lab  09/05/20 1137 09/05/20 1751 09/05/20 2104 09/06/20 0254 09/09/20 1840  NA 137 126*  --  133* 138  K 4.5 3.8  --  3.9 3.3*  CL 104  --   --  101 102  CO2 26  --   --  22 26  GLUCOSE 238*  --   --  456* 111*  BUN 11  --   --  15 14  CREATININE 1.23*  --  1.17* 1.29* 1.11*  CALCIUM 8.7*  --   --  8.3* 8.9    GFR: Estimated Creatinine Clearance: 77.5 mL/min (A) (by C-G formula based on SCr of 1.11 mg/dL (H)).  Liver Function Tests: Recent Labs  Lab 09/09/20 1840  AST 17  ALT 16  ALKPHOS 57  BILITOT 0.7  PROT 6.6  ALBUMIN 2.8*    Urine analysis:    Component Value Date/Time   COLORURINE YELLOW 04/20/2018 1537   APPEARANCEUR CLEAR 04/20/2018 1537   LABSPEC 1.013 04/20/2018 1537   PHURINE 5.0 04/20/2018 1537   GLUCOSEU >=500 (A) 04/20/2018 1537   HGBUR LARGE (A) 04/20/2018 1537   BILIRUBINUR NEGATIVE 04/20/2018 1537   KETONESUR NEGATIVE 04/20/2018 1537   PROTEINUR 100 (A) 04/20/2018 1537   UROBILINOGEN 0.2 08/18/2014 1423   NITRITE NEGATIVE 04/20/2018 1537   LEUKOCYTESUR NEGATIVE 04/20/2018 1537    Radiological Exams on Admission: DG Chest 2 View  Result Date: 09/09/2020 CLINICAL DATA:  Chest pain. EXAM: CHEST - 2 VIEW COMPARISON:  Chest radiograph dated 09/05/2020. FINDINGS: Mild cardiomegaly. No focal consolidation, pleural effusion, or pneumothorax. No acute osseous pathology. IMPRESSION: No active cardiopulmonary disease. Electronically Signed   By: Anner Crete M.D.   On: 09/09/2020 20:39   CT HEAD WO CONTRAST (5MM)  Result Date: 09/09/2020 CLINICAL DATA:  TIA EXAM: CT HEAD WITHOUT CONTRAST TECHNIQUE: Contiguous axial images were obtained from the base of the skull through the vertex without intravenous contrast. COMPARISON:  MRI brain dated 08/28/2020 FINDINGS: Brain: No evidence of acute infarction, hemorrhage, hydrocephalus, extra-axial collection or mass lesion/mass effect. Subcortical white matter and periventricular small vessel ischemic changes. Old  left basal ganglia lacunar infarct. Vascular: Mild intracranial atherosclerosis. Skull: Normal. Negative for fracture or focal lesion. Sinuses/Orbits: The visualized paranasal sinuses are essentially clear. The mastoid air cells are unopacified. Other: None. IMPRESSION: No evidence of acute intracranial abnormality. Small vessel ischemic changes. Old left basal ganglia lacunar infarct. Electronically Signed   By: Julian Hy M.D.   On: 09/09/2020 19:58    EKG: Independently reviewed.  Normal sinus rhythm at 83 bpm.  QTC borderline at 491.  Assessment/Plan Principal Problem:   Hypertensive emergency Active Problems:   History of CVA (cerebrovascular accident)   HTN (hypertension)   Diabetes mellitus (  [place])   Other hyperlipidemia   Chronic diastolic (congestive) heart failure (HCC)   OSA on CPAP   Non-ST elevation (NSTEMI) myocardial infarction [facility])  NSTEMI Hypertensive emergency > Patient presenting with blood pressure in the 200s and chest pain with troponin elevated to 3600.  At this time suspected to be demand ischemia leading to type II NSTEMI in the setting of hypertensive emergency. > She was recently admitted for NSTEMI earlier this week and has had a cath and stent performed.  There was distal disease noted at that time but was not intervened upon but did not appear to be contributing to her symptoms. > We will treat hypertension as below.  Monitor for improvement in symptoms. > Cardiology consulted in the ED and will see the patient tomorrow.  They recommend to start heparin if her troponin trends up.  They will consider cath based on their evaluation tomorrow and how she does overnight. - Recent echo hold off for now. - Monitor in progressive unit - Restart home carvedilol, amlodipine, Imdur, Lasix - Continue as needed labetalol - Trend troponin  Hypokalemia > Mildly potassium at 3.3 in the ED. - Replete with 40 mEq p.o. potassium - Check magnesium    Diastolic heart  failure > Echo in the last week with EF 60-65%, indeterminate diastolic parameters and normal RV function - Continue home Lasix, Imdur  OSA - Continue home CPAP   Hyperlipidemia History of CVA > Has some numbness and tingling in her arm with her chest pain however this has been present on previous admissions without any changes on MRI.  Also with normal CT head.  Symptoms were suspicious for hypertensive emergency leading to demand ischemia and NSTEMI as above. - Continue to monitor - Continue home aspirin and Plavix and statin   Diabetes >On 18 units nightly and SSI with meals at home. - Continue with 10 units nightly and SSI here   DVT prophylaxis: Heparin Code Status:   Full Family Communication:  Mom was present on speaker phone during our discussion. Disposition Plan:   Patient is from:  Home  Anticipated DC to:  Home  Anticipated DC date:  1 to 2 days  Anticipated DC barriers: None Consults called:  Cardiology, consulted by EDP.   Admission status:  Observation, progressive   Severity of Illness: The appropriate patient status for this patient is OBSERVATION. Observation status is judged to be reasonable and necessary in order to provide the required intensity of service to ensure the patient's safety. The patient's presenting symptoms, physical exam findings, and initial radiographic and laboratory data in the context of their medical condition is felt to place them at decreased risk for further clinical deterioration. Furthermore, it is anticipated that the patient will be medically stable for discharge from the hospital within 2 midnights of admission. The following factors support the patient status of observation.   " The patient's presenting symptoms include chest pain, left arm face and chest numbness. " The physical exam findings include murmur, obesity, otherwise stable exam. " The initial radiographic and laboratory data are Lab work-up showed CMP with potassium 3.3,  creatinine stable at 1.1, glucose 111, albumin 2.8.  CBC within normal limits.  Troponin initially elevated to 3600 with repeat pending.  Lipase normal.  Respiratory panel for flu COVID pending.  Chest x-ray without acute Hilda Blades and CT head without acute abnormality.     Marcelyn Bruins MD [facility]  How to contact the [facility] Attending or Consulting provider 7A -  7P or covering provider during after hours [place], for this patient?   Check the care team in [facility] and look for a) attending/consulting [facility] provider listed and b) the [facility] team listed Log into [email or web address] and use [place]'s universal password to access. If you do not have the password, please contact the hospital operator. Locate the [facility] provider you are looking for under [facility] and page to a number that you can be directly reached. If you still have difficulty reaching the provider, please page the [facility] (Director on Call) for the Hospitalists listed on amion for assistance.  09/09/2020, 11:54 PM

## 2020-09-10 ENCOUNTER — Other Ambulatory Visit: Payer: Self-pay

## 2020-09-10 ENCOUNTER — Encounter (HOSPITAL_COMMUNITY): Payer: Self-pay | Admitting: Internal Medicine

## 2020-09-10 ENCOUNTER — Telehealth (HOSPITAL_COMMUNITY): Payer: Self-pay

## 2020-09-10 ENCOUNTER — Observation Stay (HOSPITAL_COMMUNITY): Payer: Federal, State, Local not specified - PPO

## 2020-09-10 DIAGNOSIS — I10 Essential (primary) hypertension: Secondary | ICD-10-CM | POA: Diagnosis not present

## 2020-09-10 DIAGNOSIS — I214 Non-ST elevation (NSTEMI) myocardial infarction: Secondary | ICD-10-CM | POA: Diagnosis present

## 2020-09-10 DIAGNOSIS — Z79899 Other long term (current) drug therapy: Secondary | ICD-10-CM | POA: Diagnosis not present

## 2020-09-10 DIAGNOSIS — I110 Hypertensive heart disease with heart failure: Secondary | ICD-10-CM | POA: Diagnosis present

## 2020-09-10 DIAGNOSIS — Z91040 Latex allergy status: Secondary | ICD-10-CM | POA: Diagnosis not present

## 2020-09-10 DIAGNOSIS — F32A Depression, unspecified: Secondary | ICD-10-CM | POA: Diagnosis present

## 2020-09-10 DIAGNOSIS — I161 Hypertensive emergency: Principal | ICD-10-CM

## 2020-09-10 DIAGNOSIS — Z20822 Contact with and (suspected) exposure to covid-19: Secondary | ICD-10-CM | POA: Diagnosis present

## 2020-09-10 DIAGNOSIS — Z6841 Body Mass Index (BMI) 40.0 and over, adult: Secondary | ICD-10-CM | POA: Diagnosis not present

## 2020-09-10 DIAGNOSIS — E559 Vitamin D deficiency, unspecified: Secondary | ICD-10-CM | POA: Diagnosis present

## 2020-09-10 DIAGNOSIS — E876 Hypokalemia: Secondary | ICD-10-CM | POA: Diagnosis present

## 2020-09-10 DIAGNOSIS — E1143 Type 2 diabetes mellitus with diabetic autonomic (poly)neuropathy: Secondary | ICD-10-CM | POA: Diagnosis present

## 2020-09-10 DIAGNOSIS — Z794 Long term (current) use of insulin: Secondary | ICD-10-CM | POA: Diagnosis not present

## 2020-09-10 DIAGNOSIS — E7849 Other hyperlipidemia: Secondary | ICD-10-CM | POA: Diagnosis present

## 2020-09-10 DIAGNOSIS — E1140 Type 2 diabetes mellitus with diabetic neuropathy, unspecified: Secondary | ICD-10-CM | POA: Diagnosis not present

## 2020-09-10 DIAGNOSIS — G2581 Restless legs syndrome: Secondary | ICD-10-CM | POA: Diagnosis present

## 2020-09-10 DIAGNOSIS — E1165 Type 2 diabetes mellitus with hyperglycemia: Secondary | ICD-10-CM | POA: Diagnosis present

## 2020-09-10 DIAGNOSIS — K3184 Gastroparesis: Secondary | ICD-10-CM | POA: Diagnosis present

## 2020-09-10 DIAGNOSIS — Z7902 Long term (current) use of antithrombotics/antiplatelets: Secondary | ICD-10-CM | POA: Diagnosis not present

## 2020-09-10 DIAGNOSIS — K219 Gastro-esophageal reflux disease without esophagitis: Secondary | ICD-10-CM | POA: Diagnosis present

## 2020-09-10 DIAGNOSIS — I2510 Atherosclerotic heart disease of native coronary artery without angina pectoris: Secondary | ICD-10-CM | POA: Diagnosis present

## 2020-09-10 DIAGNOSIS — I5032 Chronic diastolic (congestive) heart failure: Secondary | ICD-10-CM | POA: Diagnosis not present

## 2020-09-10 DIAGNOSIS — F419 Anxiety disorder, unspecified: Secondary | ICD-10-CM | POA: Diagnosis present

## 2020-09-10 DIAGNOSIS — I21A1 Myocardial infarction type 2: Secondary | ICD-10-CM | POA: Diagnosis present

## 2020-09-10 DIAGNOSIS — G4733 Obstructive sleep apnea (adult) (pediatric): Secondary | ICD-10-CM | POA: Diagnosis present

## 2020-09-10 LAB — BASIC METABOLIC PANEL
Anion gap: 9 (ref 5–15)
BUN: 12 mg/dL (ref 6–20)
CO2: 27 mmol/L (ref 22–32)
Calcium: 8.3 mg/dL — ABNORMAL LOW (ref 8.9–10.3)
Chloride: 103 mmol/L (ref 98–111)
Creatinine, Ser: 1.02 mg/dL — ABNORMAL HIGH (ref 0.44–1.00)
GFR, Estimated: >60 mL/min (ref >60)
Glucose, Bld: 120 mg/dL — ABNORMAL HIGH (ref 70–99)
Potassium: 3.4 mmol/L — ABNORMAL LOW (ref 3.5–5.1)
Sodium: 139 mmol/L (ref 135–145)

## 2020-09-10 LAB — CBG MONITORING, ED
Glucose-Capillary: 194 mg/dL — ABNORMAL HIGH (ref 70–99)
Glucose-Capillary: 197 mg/dL — ABNORMAL HIGH (ref 70–99)
Glucose-Capillary: 277 mg/dL — ABNORMAL HIGH (ref 70–99)

## 2020-09-10 LAB — TROPONIN I (HIGH SENSITIVITY)
Troponin I (High Sensitivity): 2902 ng/L (ref <18)
Troponin I (High Sensitivity): 2457 ng/L (ref <18)

## 2020-09-10 LAB — CBC
HCT: 40.6 % (ref 36.0–46.0)
Hemoglobin: 12.6 g/dL (ref 12.0–15.0)
MCH: 27 pg (ref 26.0–34.0)
MCHC: 31 g/dL (ref 30.0–36.0)
MCV: 86.9 fL (ref 80.0–100.0)
Platelets: 348 10*3/uL (ref 150–400)
RBC: 4.67 MIL/uL (ref 3.87–5.11)
RDW: 13.4 % (ref 11.5–15.5)
WBC: 7.2 10*3/uL (ref 4.0–10.5)
nRBC: 0 % (ref 0.0–0.2)

## 2020-09-10 LAB — RESP PANEL BY RT-PCR (FLU A&B, COVID) ARPGX2
Influenza A by PCR: NEGATIVE
Influenza B by PCR: NEGATIVE
SARS Coronavirus 2 by RT PCR: NEGATIVE

## 2020-09-10 LAB — HIV ANTIBODY (ROUTINE TESTING W REFLEX): HIV Screen 4th Generation wRfx: NONREACTIVE

## 2020-09-10 LAB — MAGNESIUM: Magnesium: 1.6 mg/dL — ABNORMAL LOW (ref 1.7–2.4)

## 2020-09-10 LAB — ECHOCARDIOGRAM LIMITED

## 2020-09-10 MED ORDER — ONDANSETRON HCL 4 MG/2ML IJ SOLN
4.0000 mg | Freq: Four times a day (QID) | INTRAMUSCULAR | Status: DC | PRN
Start: 2020-09-10 — End: 2020-09-11

## 2020-09-10 MED ORDER — METOCLOPRAMIDE HCL 5 MG/ML IJ SOLN: 10.0000 mg | Freq: Three times a day (TID) | INTRAMUSCULAR | Status: DC | PRN

## 2020-09-10 MED ORDER — GABAPENTIN 300 MG PO CAPS
300.0000 mg | ORAL_CAPSULE | Freq: Two times a day (BID) | ORAL | Status: DC | PRN
  Administered 2020-09-10: 600 mg via ORAL
  Filled 2020-09-10: qty 2

## 2020-09-10 MED ORDER — INSULIN ASPART 100 UNIT/ML IJ SOLN
0.0000 [IU] | Freq: Three times a day (TID) | INTRAMUSCULAR | Status: DC
  Administered 2020-09-10 (×2): 3 [IU] via SUBCUTANEOUS
  Administered 2020-09-10: 8 [IU] via SUBCUTANEOUS
  Administered 2020-09-11: 3 [IU] via SUBCUTANEOUS

## 2020-09-10 MED ORDER — INSULIN GLARGINE-YFGN 100 UNIT/ML ~~LOC~~ SOLN
10.0000 [IU] | Freq: Every day | SUBCUTANEOUS | Status: DC
  Administered 2020-09-10: 10 [IU] via SUBCUTANEOUS
  Filled 2020-09-10 (×2): qty 0.1

## 2020-09-10 MED ORDER — TRAMADOL HCL 50 MG PO TABS
50.0000 mg | ORAL_TABLET | Freq: Three times a day (TID) | ORAL | Status: DC | PRN
Start: 2020-09-10 — End: 2020-09-11
  Administered 2020-09-10: 50 mg via ORAL
  Filled 2020-09-10: qty 1

## 2020-09-10 MED ORDER — FAMOTIDINE 20 MG PO TABS
20.0000 mg | ORAL_TABLET | Freq: Two times a day (BID) | ORAL | Status: DC
  Administered 2020-09-10 – 2020-09-11 (×3): 20 mg via ORAL
  Filled 2020-09-10 (×3): qty 1

## 2020-09-10 MED ORDER — LORATADINE 10 MG PO TABS
10.0000 mg | ORAL_TABLET | Freq: Every day | ORAL | Status: DC
  Administered 2020-09-10 – 2020-09-11 (×2): 10 mg via ORAL
  Filled 2020-09-10 (×2): qty 1

## 2020-09-10 MED ORDER — INSULIN GLARGINE-YFGN 100 UNIT/ML ~~LOC~~ SOLN
15.0000 [IU] | Freq: Every day | SUBCUTANEOUS | Status: DC
  Administered 2020-09-10: 15 [IU] via SUBCUTANEOUS
  Filled 2020-09-10 (×3): qty 0.15

## 2020-09-10 MED ORDER — CARVEDILOL 25 MG PO TABS
25.0000 mg | ORAL_TABLET | Freq: Two times a day (BID) | ORAL | Status: DC
  Administered 2020-09-10 – 2020-09-11 (×3): 25 mg via ORAL
  Filled 2020-09-10: qty 1
  Filled 2020-09-10 (×2): qty 2

## 2020-09-10 MED ORDER — ONDANSETRON HCL 4 MG/2ML IJ SOLN: 4.0000 mg | Freq: Four times a day (QID) | INTRAMUSCULAR | Status: DC | PRN

## 2020-09-10 MED ORDER — LABETALOL HCL 5 MG/ML IV SOLN: 10.0000 mg | INTRAVENOUS | Status: DC | PRN

## 2020-09-10 MED ORDER — INSULIN GLARGINE-YFGN 100 UNIT/ML ~~LOC~~ SOLN
5.0000 [IU] | Freq: Once | SUBCUTANEOUS | Status: AC
  Administered 2020-09-10: 5 [IU] via SUBCUTANEOUS
  Filled 2020-09-10: qty 0.05

## 2020-09-10 NOTE — Telephone Encounter (Signed)
Pt insurance is active and benefits verified through [place] $30, DED 0/0 met, out of pocket $6,500/$2,205.02 met, co-insurance 0%. no pre-authorization required. Passport, 09/10/2020'@1' :36pm, REF# [id number]  2ndary insurance is active and benefits verified through [employer].Marland Kitchen Co-pay 0, DED 0/0 met, out of pocket $5,000/$574.75 met, co-insurance 0%. No pre-authorization required. [place] 09/10/2020'@2' :00pm, REF# [id number]  Will contact patient to see if she is interested in the Cardiac Rehab Program. If interested, patient will need to complete follow up appt. Once completed, patient will be contacted for scheduling upon review by the RN Navigator.

## 2020-09-10 NOTE — ED Notes (Signed)
Report given to Sarah B, RN  

## 2020-09-10 NOTE — ED Notes (Signed)
Pt given graham crackers and ice per her request.  No emesis after liquid diet at 2 pm.  Does not need prn reglan at this time.

## 2020-09-10 NOTE — Telephone Encounter (Signed)
Called patient to see if she is interested in the Cardiac Rehab Program. Patient expressed interest. Explained scheduling process and went over insurance, patient verbalized understanding. Will contact patient for scheduling once f/u has been completed. 

## 2020-09-10 NOTE — Progress Notes (Signed)
PROGRESS NOTE  Tina [name]  DOB: 1970-04-14  PCP: [name], DO [id number]  DOA: 09/09/2020  LOS: 0 days  Hospital Day: 2   Chief complaint: Chest pain  Brief narrative: Tina [name] is a 50 y.o. female with PMH significant for CAD status post stenting 5 years ago, morbid obesity, OSA,, DM2, HTN, HLD, CVA, diastolic CHF, GERD, migraine, RLS anxiety/depression. Patient presented to the ED on 8/28 with persistently elevated blood pressure, nausea, vomiting and left-sided chest pain for 2 days, unable to hold down anything including meds.  Of note she was recently admitted this past week from 8/24-8/25 for NSTEMI underwent left heart cath finding obstructive CAD and received stenting of her left marginal branch.  She did have some distal LAD disease but was not thought to be contribute to her symptoms and she was chest pain-free after intervention.  She also had poorly controlled hypertension was started on additional antihypertensives on discharge with amlodipine and Imdur.  Echocardiogram at that time was performed with EF 60-65%, indeterminate diastolic function and normal RV function.  In the ED, blood pressure was elevated to over 200 for which patient received clonidine and labetalol. Labs with potassium 3.3, creatinine 1.1, albumin 2.8, CBC unremarkable Troponin was first elevated to 3600 and repeat troponin last time was 3000. Case was discussed with cardiologist on-call.  Patient was started on heparin drip Admitted to hospitalist service  Subjective: Patient was seen and examined this morning. Pleasant middle-aged African-American female.  Lying down in bed.  Nauseated.  Vomited once last night.  Remains n.p.o. at this time.  Still has Tina [name] chest pain.  Spouse at bedside.  Assessment/Plan: NSTEMI History of CAD, recent cath and stenting -Patient presented with chest pain, elevated blood pressure, nausea vomiting, inability to take her meds including blood  thinners.  Patient had a stent placed 5 days ago. -Troponin elevated to over 3000.  Repeat troponin ordered for this morning. -Currently on heparin drip -Cardiology Dr. Virgina Jock following. Recent Labs    09/09/20 1840 09/09/20 2031 09/10/20 [id number]  TROPONINIHS [id number]* 3,062* 2,902*   Hypertensive emergency Chronic diastolic CHF -Presented with blood pressure elevated to over 200 with chest pain, elevated troponin, nausea and vomiting.   -Patient reports inability to hold her medicines down because of nausea and vomiting. -Home meds include Coreg 25 mg twice daily, clonidine 0.4 mg twice daily, Lasix 40 mg daily, Imdur 30 mg daily, amlodipine 5 mg daily. -Currently resumed on all of them. -Blood pressure seems to be improving towards normal this morning. -Echo from 8/25 with EF 60 to 65%, mild LVH.  Intractable nausea vomiting -May have underlying gastroparesis. -IV Reglan 10 mg every 8 hours as needed ordered.  QTC 469 ms today -Start on clear liquid diet for now.  Advance as tolerated.  Uncontrolled type 2 diabetes mellitus Hyperglycemia -A1c 8.4 on 08/13/2020 -Home meds include Lantus 18 units at bedtime, Ozempic 0.5 mg every Saturday -Currently on Lantus 10 units nightly and sliding scale insulin with Accu-Cheks -Blood sugar level running elevated, 194 this morning.  I will give her 5 more units of Lantus this morning and increase the nightly Recent Labs  Lab 09/05/20 1850 09/05/20 2058 09/06/20 0741 09/06/20 1141 09/10/20 0817  GLUCAP 300* 383* 440* 481* 194*   Morbid obesity  -There is no height or weight on file to calculate BMI. Patient has been advised to make an attempt to improve diet and exercise patterns to aid in weight loss.  OSA - Continue  home CPAP    History of CVA Hyperlipidemia - Continue home aspirin and Plavix and statin    Mobility: Encourage ambulation Code Status:   Code Status: Full Code  Nutritional status: There is no height or weight on  file to calculate BMI.     Diet:  Diet Order             Diet clear liquid Room service appropriate? Yes; Fluid consistency: Thin  Diet effective now                  DVT prophylaxis:  heparin injection 5,000 Units Start: 09/10/20 1400   Antimicrobials: None Fluid: None Consultants: None Family Communication: Husband at bedside  Status is: Observation  Remains inpatient appropriate because: Intractable nausea, vomiting, elevated troponin  Dispo: The patient is from: Home              Anticipated d/c is to: Home in 1 to 2 days              Patient currently is not medically stable to d/c.   Difficult to place patient No     Infusions:    Scheduled Meds:  amLODipine  5 mg Oral Daily   aspirin EC  81 mg Oral QHS   atorvastatin  40 mg Oral QHS   carvedilol  25 mg Oral BID WC   cloNIDine  0.4 mg Oral BID   clopidogrel  75 mg Oral Daily   famotidine  20 mg Oral BID   furosemide  40 mg Oral Daily   heparin  5,000 Units Subcutaneous Q8H   insulin aspart  0-15 Units Subcutaneous TID WC   insulin glargine-yfgn  15 Units Subcutaneous QHS   insulin glargine-yfgn  5 Units Subcutaneous Once   isosorbide mononitrate  30 mg Oral Daily   loratadine  10 mg Oral Daily   pantoprazole  40 mg Oral Daily   sodium chloride flush  3 mL Intravenous Q12H    Antimicrobials: Anti-infectives (From admission, onward)    None       PRN meds: acetaminophen **OR** acetaminophen, [START ON 09/11/2020] gabapentin, labetalol, metoCLOPramide (REGLAN) injection, ondansetron (ZOFRAN) IV, polyethylene glycol, traMADol   Objective: Vitals:   09/10/20 0845 09/10/20 0900  BP: (!) 111/50 124/61  Pulse: 78 74  Resp: (!) 26 20  SpO2: 98% 95%   No intake or output data in the 24 hours ending 09/10/20 1007 There were no vitals filed for this visit. Weight change:  There is no height or weight on file to calculate BMI.   Physical Exam: General exam: Pleasant middle-aged morbidly obese  African-American female. Skin: No rashes, lesions or ulcers. HEENT: Atraumatic, normocephalic, no obvious bleeding Lungs: Clear to auscultation bilaterally CVS: Regular rate and rhythm, no murmur GI/Abd soft, nontender, nondistended, bowel sound present CNS: Alert, awake, oriented x3 Psychiatry: Mood appropriate Extremities: No pedal edema, no calf tenderness  Data Review: I have personally reviewed the laboratory data and studies available.  Recent Labs  Lab 09/05/20 1137 09/05/20 1751 09/05/20 2104 09/06/20 0254 09/09/20 1840 09/10/20 0237  WBC 6.9  --  11.7* 9.9 7.6 7.2  NEUTROABS  --   --   --   --  5.3  --   HGB 12.4 12.6 13.0 11.9* 12.9 12.6  HCT 38.5 37.0 38.9 35.5* 39.7 40.6  MCV 84.8  --  82.4 82.6 83.8 86.9  PLT 297  --  295 284 364 348   Recent Labs  Lab 09/05/20 1137  09/05/20 1751 09/05/20 2104 09/06/20 0254 09/09/20 1840 09/10/20 0237 09/10/20 0238  NA 137 126*  --  133* 138  --  139  K 4.5 3.8  --  3.9 3.3*  --  3.4*  CL 104  --   --  101 102  --  103  CO2 26  --   --  22 26  --  27  GLUCOSE 238*  --   --  456* 111*  --  120*  BUN 11  --   --  15 14  --  12  CREATININE 1.23*  --  1.17* 1.29* 1.11*  --  1.02*  CALCIUM 8.7*  --   --  8.3* 8.9  --  8.3*  MG  --   --   --   --   --  1.6*  --     F/u labs ordered Unresulted Labs (From admission, onward)     Start     Ordered   09/11/20 0500  CBC with Differential/Platelet  Daily,   R      09/10/20 0824   09/11/20 [id number]  Basic metabolic panel  Daily,   R      09/10/20 0824   09/09/20 2340  HIV Antibody (routine testing w rflx)  (HIV Antibody (Routine testing w reflex) panel)  Once,   STAT        09/09/20 2342            Signed, Terrilee Croak, MD [facility] 09/10/2020

## 2020-09-10 NOTE — Progress Notes (Signed)
Echocardiogram 2D Echocardiogram has been performed.  Oneal Deputy Duayne Brideau RDCS 09/10/2020, 9:56 AM

## 2020-09-10 NOTE — Progress Notes (Signed)
Spike with RN, pt stated she doesn't want to wear CPAP for the night.

## 2020-09-10 NOTE — Consult Note (Signed)
CARDIOLOGY CONSULT NOTE  Patient ID: Tina [name] MRN: [id number] DOB/AGE: [birth year] 51 y.o.  Admit date: 09/09/2020 Referring Physician: [facility] Reason for Consultation:  Chest pain  HPI:   [name] with uncontrolled hypertension, insulin-dependent diabetes mellitus, h/o stroke 2011, obesity, NSTEMI treated with [facility] PCI 09/05/2020, now admitted with nausea, chest pain  Patient was discharged on 09/06/2020 and had been doing well. However, a day later, she started experiencing severe nausea with episodes of vomiting and was unable to keep food and pills down. Following this, she developed left upper chest tightness. Patient states that this pain was different from her admission last week with NSTEMI when she had primarily left sided chest pain. On arrival to [facility] ER, she was found to be hypertensive at 212/103 mmHg. EKG showed no acute ischemic changes. HS trop was elevated at 3600, lower than 4200 seen on 09/05/2020. Subsequent trop HS was further lower at 3000.  Patient is currently chest pain free, has been able to keep meds down. Blood pressure is now back to normal range.   Past Medical History:  Diagnosis Date   Allergy    Anemia    DURING MENSES--HAS HEAVY BLEEDING WITH PERODS   Angioedema 08/13/2020   Anxiety    Back pain, chronic    "ongoing"   Blood transfusion    IN 2012  AFTER C -SECTION   Cerebral thrombosis with cerebral infarction [facility]) JUNE 2011   RIGHT SIDED WEAKNESS ( ARM AND LEG ) AND SPASMS-remains with slight weakness and vertigo.   Constipation    Depression    Diabetes mellitus    Diabetic neuropathy (HCC)    BOTH FEET --COMES AND GOES   Edema, lower extremity    Endometriosis    Fatty liver    GERD (gastroesophageal reflux disease)    with pregnancy   H/O eye surgery    Headache(784.0)    MIGRAINES--NOT REALLY HEADACHE-MORE LIKE PRESSURE SENSATION IN HEAD-FEELS DIZZIY AND  FAINT AS THE PRESSURE  RESOLVES   Hernia, incisional, RLQ, s/p lap repair Sep 2013 09/02/2011   History of vertigo 03/22/2018   Hx of migraines 10/19/2011   Hypertension    Leg pain, right    "like bad Charley horse"   Multiple food allergies    Panic disorder without agoraphobia    Rash    HANDS, ARMS --STATES HX OF RASH EVER SINCE CHILDBIRTH/PREGNANCY.  STATES THE RASH OFTEN OCCURS WHEN SHE IS REALLY STRESSED."goes and comes-presently left ring finger"   Restless leg syndrome    DIAGNOSED BY SLEEP STUDY - PT TOLD SHE DID NOT HAVE SLEEP APNEA   Right rotator cuff tear    PAIN IN RIGHT SHOULDER   SBO (small bowel obstruction) ([place]) 06/03/2012   Shortness of breath    Spastic hemiplegia affecting dominant side (HCC)    Stomach problems    Stroke (HCC)    Ventral hernia    RIGHT LOWER QUADRANT-CAUSING SOME PAIN   Weakness of right side of body      Past Surgical History:  Procedure Laterality Date   APPLICATION OF WOUND VAC N/A 05/25/2015   Procedure: APPLICATION OF WOUND VAC;  Surgeon: Michael Boston, MD;  Location: [facility];  Service: General;  Laterality: N/A;   CESAREAN SECTION  2012   COLONOSCOPY     CORONARY STENT INTERVENTION N/A 09/05/2020   Procedure: CORONARY STENT INTERVENTION;  Surgeon: Nigel Mormon, MD;  Location: [place] [facility];  Service: Cardiovascular;  Laterality: N/A;   DIAGNOSTIC LAPAROSCOPY     ESOPHAGOGASTRODUODENOSCOPY N/A 06/03/2012   Procedure: ESOPHAGOGASTRODUODENOSCOPY (EGD);  Surgeon: [name] Craver, MD;  Location: [facility] ENDOSCOPY;  Service: Endoscopy;  Laterality: N/A;   EXCISION MASS ABDOMINAL N/A 05/25/2015   Procedure: ABDOMINAL WALL EXPLORATION EXCISION OF SEROMA REMOVAL OF REDUNDANT SKIN ;  Surgeon: Michael Boston, MD;  Location: [facility];  Service: General;  Laterality: N/A;   EYE SURGERY     Eye laser for vessel hemorrhaging   fybroid removal     HERNIA REPAIR  10/03/11   ventral hernia repair   INSERTION OF MESH N/A 02/04/2013   Procedure: INSERTION OF MESH;  Surgeon: Adin Hector, MD;  Location: [facility];  Service: General;  Laterality: N/A;   INTRAVASCULAR ULTRASOUND/IVUS N/A 09/05/2020   Procedure: Intravascular Ultrasound/IVUS;  Surgeon: Nigel Mormon, MD;  Location: [place] [facility];  Service: Cardiovascular;  Laterality: N/A;   LEFT HEART CATH AND CORONARY ANGIOGRAPHY N/A 09/05/2020   Procedure: LEFT HEART CATH AND CORONARY ANGIOGRAPHY;  Surgeon: Nigel Mormon, MD;  Location: [place] [facility];  Service: Cardiovascular;  Laterality: N/A;   UMBILICAL HERNIA REPAIR N/A 02/04/2013   Procedure: LAPAROSCOPIC ventral wall hernia repair LAPAROSCOPIC LYSIS OF ADHESIONS laparoscopic exploration of abdomen ;  Surgeon: Adin Hector, MD;  Location: [facility];  Service: General;  Laterality: N/A;   UPPER GASTROINTESTINAL ENDOSCOPY     URETER REVISION     Bilateral "twisted"   UTERINE FIBROID SURGERY     2 SURGERIES FOR FIBROIDS   VENTRAL HERNIA REPAIR  10/03/2011   Procedure: LAPAROSCOPIC VENTRAL HERNIA;  Surgeon: Adin Hector, MD;  Location: [facility];  Service: General;  Laterality: N/A;      Family History  Problem Relation Age of Onset   Diabetes Father    Kidney disease Father    Depression Father    Drug abuse Father    Allergic rhinitis Mother    Eczema Mother    Urticaria Mother    Depression Mother    Anxiety disorder Mother    Bipolar disorder Mother    Alcoholism Mother    Drug abuse Mother    Eating disorder Mother    Diabetes Maternal Grandmother    Hyperlipidemia Paternal Grandmother    Stroke Paternal Grandmother    Eczema Sister    Urticaria Sister    Colon cancer Paternal Uncle    Other Neg Hx    Angioedema Neg Hx    Asthma Neg Hx    Colon polyps Neg Hx    Esophageal cancer Neg Hx    Rectal cancer Neg Hx    Stomach cancer Neg Hx      Social History: Social History   Socioeconomic History   Marital status: Married    Spouse name: Annie Main   Number of children: 3   Years of education: BA degree   Highest  education level: Not on file  Occupational History   Occupation: stay at home mom    Employer: UNEMPLOYED  Tobacco Use   Smoking status: Never   Smokeless tobacco: Never  Vaping Use   Vaping Use: Never used  Substance and Sexual Activity   Alcohol use: No    Alcohol/week: 0.0 standard drinks   Drug use: No   Sexual activity: Yes    Birth control/protection: None  Other Topics Concern   Not on file  Social History Narrative   Patient is married with 2 children.   Patient is  right handed.   Patient has her  BA degree.   Patient drinks 1 cup daily.   Social Determinants of Health   Financial Resource Strain: Not on file  Food Insecurity: Not on file  Transportation Needs: Not on file  Physical Activity: Not on file  Stress: Not on file  Social Connections: Not on file  Intimate Partner Violence: Not on file     (Not in a hospital admission)   Review of Systems  Constitutional: Negative for decreased appetite, malaise/fatigue, weight gain and weight loss.  HENT:  Negative for congestion.   Eyes:  Negative for visual disturbance.  Cardiovascular:  Positive for chest pain (Now absent). Negative for dyspnea on exertion, leg swelling, palpitations and syncope.  Respiratory:  Negative for cough.   Endocrine: Negative for cold intolerance.  Hematologic/Lymphatic: Does not bruise/bleed easily.  Skin:  Negative for itching and rash.  Musculoskeletal:  Negative for myalgias.  Gastrointestinal:  Positive for nausea (Now resolved) and vomiting (Now resolved). Negative for abdominal pain.  Genitourinary:  Negative for dysuria.  Neurological:  Negative for dizziness and weakness.  Psychiatric/Behavioral:  The patient is not nervous/anxious.   All other systems reviewed and are negative.    Physical Exam: Physical Exam Vitals and nursing note reviewed.  Constitutional:      General: She is not in acute distress.    Appearance: She is well-developed.  HENT:     Head:  Normocephalic and atraumatic.  Eyes:     Conjunctiva/sclera: Conjunctivae normal.     Pupils: Pupils are equal, round, and reactive to light.  Neck:     Vascular: No JVD.  Cardiovascular:     Rate and Rhythm: Normal rate and regular rhythm.     Pulses: Normal pulses and intact distal pulses.     Heart sounds: No murmur heard. Pulmonary:     Effort: Pulmonary effort is normal.     Breath sounds: Normal breath sounds. No wheezing or rales.  Abdominal:     General: Bowel sounds are normal.     Palpations: Abdomen is soft.     Tenderness: There is no rebound.  Musculoskeletal:        General: No tenderness. Normal range of motion.     Right lower leg: No edema.     Left lower leg: No edema.  Lymphadenopathy:     Cervical: No cervical adenopathy.  Skin:    General: Skin is warm and dry.  Neurological:     Mental Status: She is alert and oriented to person, place, and time.     Cranial Nerves: No cranial nerve deficit.     Labs:   Lab Results  Component Value Date   WBC 7.2 09/10/2020   HGB 12.6 09/10/2020   HCT 40.6 09/10/2020   MCV 86.9 09/10/2020   PLT 348 09/10/2020    Recent Labs  Lab 09/09/20 1840 09/10/20 0238  NA 138 139  K 3.3* 3.4*  CL 102 103  CO2 26 27  BUN 14 12  CREATININE 1.11* 1.02*  CALCIUM 8.9 8.3*  PROT 6.6  --   BILITOT 0.7  --   ALKPHOS 57  --   ALT 16  --   AST 17  --   GLUCOSE 111* 120*    Lipid Panel     Component Value Date/Time   CHOL 175 09/06/2020 0254   CHOL 169 02/15/2018 1034   TRIG 137 09/06/2020 0254   HDL 56 09/06/2020 0254   HDL 41  02/15/2018 1034   CHOLHDL 3.1 09/06/2020 0254   VLDL 27 09/06/2020 0254   LDLCALC 92 09/06/2020 0254   LDLCALC 94 02/15/2018 1034    BNP (last 3 results) Recent Labs    09/05/20 1137  BNP 80.0    HEMOGLOBIN A1C Lab Results  Component Value Date   HGBA1C 8.4 (H) 08/13/2020   MPG 194.38 08/13/2020    Cardiac Panel (last 3 results) Results for Tina, [name] (MRN  [id number]) as of 09/10/2020 09:23  Ref. Range 09/05/2020 11:37 09/05/2020 14:25 09/09/2020 18:40 09/09/2020 20:31  Troponin I (High Sensitivity) Latest Ref Range: <18 ng/L 4,982 [facility]) 4,287 (HH) 3,621 (HH) 3,062 [facility])     Radiology: DG Chest 2 View  Result Date: 09/09/2020 CLINICAL DATA:  Chest pain. EXAM: CHEST - 2 VIEW COMPARISON:  Chest radiograph dated 09/05/2020. FINDINGS: Mild cardiomegaly. No focal consolidation, pleural effusion, or pneumothorax. No acute osseous pathology. IMPRESSION: No active cardiopulmonary disease. Electronically Signed   By: Anner Crete M.D.   On: 09/09/2020 20:39   CT HEAD WO CONTRAST (5MM)  Result Date: 09/09/2020 CLINICAL DATA:  TIA EXAM: CT HEAD WITHOUT CONTRAST TECHNIQUE: Contiguous axial images were obtained from the base of the skull through the vertex without intravenous contrast. COMPARISON:  MRI brain dated 08/28/2020 FINDINGS: Brain: No evidence of acute infarction, hemorrhage, hydrocephalus, extra-axial collection or mass lesion/mass effect. Subcortical white matter and periventricular small vessel ischemic changes. Old left basal ganglia lacunar infarct. Vascular: Mild intracranial atherosclerosis. Skull: Normal. Negative for fracture or focal lesion. Sinuses/Orbits: The visualized paranasal sinuses are essentially clear. The mastoid air cells are unopacified. Other: None. IMPRESSION: No evidence of acute intracranial abnormality. Small vessel ischemic changes. Old left basal ganglia lacunar infarct. Electronically Signed   By: Julian Hy M.D.   On: 09/09/2020 19:58    Scheduled Meds:  amLODipine  5 mg Oral Daily   aspirin EC  81 mg Oral QHS   atorvastatin  40 mg Oral QHS   carvedilol  25 mg Oral BID WC   cloNIDine  0.4 mg Oral BID   clopidogrel  75 mg Oral Daily   famotidine  20 mg Oral BID   furosemide  40 mg Oral Daily   heparin  5,000 Units Subcutaneous Q8H   insulin aspart  0-15 Units Subcutaneous TID WC   insulin glargine-yfgn  10 Units  Subcutaneous QHS   isosorbide mononitrate  30 mg Oral Daily   loratadine  10 mg Oral Daily   pantoprazole  40 mg Oral Daily   sodium chloride flush  3 mL Intravenous Q12H   Continuous Infusions: PRN Meds:.acetaminophen **OR** acetaminophen, [START ON 09/11/2020] gabapentin, labetalol, ondansetron (ZOFRAN) IV, polyethylene glycol, traMADol  CARDIAC STUDIES:  EKG 09/10/2020: Sinus rhythm Prolonged QTc No acute ischemic changes  Coronary intervention 09/05/2020: LM: Normal LAD: Distal apical 70% stenosis (non-culprit, very small caliber) Lcx: Mid Lcx 30% disease with just after a small OM1 branch (non-culprit)        Prox OM2 with 95% stenosis (culprit) RCA: Small caliber PDA with diffuse 60% disease LVEF 50-55% with apical inferolateral hypokinesis   Successful percutaneous coronary intervention OM2        PTCA and stent placement 2.25 X 12 mm Onyx drug-eluting stent        IVUS guided Post dilatation using 2.25 mm [place] at 22 atm and 2.5 mm  at 16 atm    Echocardiogram 09/06/2020:  1. Left ventricular ejection fraction, by estimation, is 60 to 65%. The  left  ventricle has normal function. The left ventricle has no regional  wall motion abnormalities. There is mild left ventricular hypertrophy.  Left ventricular diastolic parameters  are indeterminate. Elevated left atrial pressure.   2. Right ventricular systolic function is normal. The right ventricular  size is normal.   3. The mitral valve is grossly normal. No evidence of mitral valve  regurgitation. No evidence of mitral stenosis.   4. The aortic valve is tricuspid. Aortic valve regurgitation is not  visualized. Mild aortic valve sclerosis is present, with no evidence of  aortic valve stenosis.   5. The inferior vena cava is normal in size with greater than 50%  respiratory variability, suggesting right atrial pressure of 3 mmHg.     Assessment & Recommendations:  [name] with uncontrolled  hypertension, insulin-dependent diabetes mellitus, h/o stroke 2011, obesity, NSTEMI treated with OM2 PCI 09/05/2020, now admitted with nausea, chest pain  Chest pain: Currently resolved. Present during BP 212/103 mmHg. Chest pain different from her presentation with NSTEMI last week.   With her inability to keep meds down, there is risk of stent thrombosis. However, in absence of new EKG changes and chest pain that has now resolved, my suspicion for this is very low. Agree with serial tropX1. Will get limited echocardiogram to look for any new wall motion abnormalities. If no WMA's, trop continues to trend down, I do not think she will need repeat cath this admission. Continue DAPT with Aspirin/plavix, high intensity statin, current antihypertensive therapy.   I suspect this was hypertensive urgency presentation, likely brought upon by her recent nausea and vomiting. I reckon she may have gastroparesis. Defer management of this to primary team. I have ordered repeat EKG to see Qtc interval, in case she needs Reglan.  Keep f/u w/her primary cardiologist Dr. Terri Skains on 09/13/2020 3:45 PM       Nigel Mormon, MD Pager: [phone] Office: [phone]

## 2020-09-11 DIAGNOSIS — I161 Hypertensive emergency: Secondary | ICD-10-CM | POA: Diagnosis not present

## 2020-09-11 LAB — CBC WITH DIFFERENTIAL/PLATELET
Abs Immature Granulocytes: 0.01 10*3/uL (ref 0.00–0.07)
Basophils Absolute: 0 10*3/uL (ref 0.0–0.1)
Basophils Relative: 1 %
Eosinophils Absolute: 0.2 10*3/uL (ref 0.0–0.5)
Eosinophils Relative: 4 %
HCT: 34.3 % — ABNORMAL LOW (ref 36.0–46.0)
Hemoglobin: 11.2 g/dL — ABNORMAL LOW (ref 12.0–15.0)
Immature Granulocytes: 0 %
Lymphocytes Relative: 34 %
Lymphs Abs: 2 10*3/uL (ref 0.7–4.0)
MCH: 27.5 pg (ref 26.0–34.0)
MCHC: 32.7 g/dL (ref 30.0–36.0)
MCV: 84.3 fL (ref 80.0–100.0)
Monocytes Absolute: 0.6 10*3/uL (ref 0.1–1.0)
Monocytes Relative: 10 %
Neutro Abs: 2.9 10*3/uL (ref 1.7–7.7)
Neutrophils Relative %: 51 %
Platelets: 299 10*3/uL (ref 150–400)
RBC: 4.07 MIL/uL (ref 3.87–5.11)
RDW: 13.2 % (ref 11.5–15.5)
WBC: 5.7 10*3/uL (ref 4.0–10.5)
nRBC: 0 % (ref 0.0–0.2)

## 2020-09-11 LAB — BASIC METABOLIC PANEL
Anion gap: 8 (ref 5–15)
BUN: 16 mg/dL (ref 6–20)
CO2: 27 mmol/L (ref 22–32)
Calcium: 8.5 mg/dL — ABNORMAL LOW (ref 8.9–10.3)
Chloride: 101 mmol/L (ref 98–111)
Creatinine, Ser: 1.26 mg/dL — ABNORMAL HIGH (ref 0.44–1.00)
GFR, Estimated: 52 mL/min — ABNORMAL LOW (ref >60)
Glucose, Bld: 190 mg/dL — ABNORMAL HIGH (ref 70–99)
Potassium: 3.9 mmol/L (ref 3.5–5.1)
Sodium: 136 mmol/L (ref 135–145)

## 2020-09-11 LAB — GLUCOSE, CAPILLARY: Glucose-Capillary: 188 mg/dL — ABNORMAL HIGH (ref 70–99)

## 2020-09-11 MED ORDER — METOCLOPRAMIDE HCL 10 MG PO TABS: 10.0000 mg | ORAL_TABLET | Freq: Three times a day (TID) | ORAL | 0 refills | Status: DC | PRN

## 2020-09-11 NOTE — Discharge Summary (Signed)
Physician Discharge Summary  Tina [name] [id number] DOB: May 05, 1970 DOA: 09/09/2020  PCP: [name], DO  Admit date: 09/09/2020 Discharge date: 09/11/2020  Admitted From: Home Discharge disposition: Home   Code Status: Full Code   Discharge Diagnosis:   Principal Problem:   Hypertensive emergency Active Problems:   History of CVA (cerebrovascular accident)   HTN (hypertension)   Diabetes mellitus ([place])   Other hyperlipidemia   Chronic diastolic (congestive) heart failure (HCC)   OSA on CPAP   Non-ST elevation (NSTEMI) myocardial infarction [facility])  Chief complaint: chest pain  Brief narrative: Tina [name] is a 50 y.o. female with PMH significant for CAD status post stenting 5 years ago, morbid obesity, OSA, DM2, HTN, HLD, CVA, diastolic CHF, GERD, migraine, RLS anxiety/depression. Patient presented to the ED on 8/28 with persistently elevated blood pressure, nausea, vomiting and left-sided chest pain for 2 days, unable to hold down anything including meds.  Of note she was recently admitted this past week from 8/24-8/25 for NSTEMI underwent left heart cath finding obstructive CAD and received stenting of her left marginal branch.  She did have some distal LAD disease but was not thought to be contribute to her symptoms and she was chest pain-free after intervention.  She also had poorly controlled hypertension was started on additional antihypertensives on discharge with amlodipine and Imdur.  Echocardiogram at that time was performed with EF 60-65%, indeterminate diastolic function and normal RV function.  In the ED, blood pressure was elevated to over 200 for which patient received clonidine and labetalol. Labs with potassium 3.3, creatinine 1.1, albumin 2.8, CBC unremarkable Troponin was first elevated to 3600 and repeat troponin last time was 3000. Case was discussed with cardiologist on-call.  Patient was started on heparin drip Admitted to hospitalist  service Cardiology consult appreciated. See below for details  Subjective: Patient was seen and examined this morning. Sitting up at the edge of the bed.  Not in distress.  No chest pain.  Tolerated full liquid diet yesterday but did not like solid diet this morning.   Labs this morning with downtrending troponin.  Assessment/Plan: NSTEMI History of CAD, recent cath and stenting -Patient presented with chest pain, elevated blood pressure, nausea vomiting, inability to take her meds including blood thinners.  Patient had a stent placed 5 days ago. -Initial troponin was elevated to over 3000 and patient was started on heparin drip.  Cardiology consultation was obtained. -Troponin level trended down.  Patient did not need any intervention from cardiology. -Chest pain resolved now. -Patient to continue aspirin, Plavix, and statin as previously planned. Recent Labs    09/09/20 2031 09/10/20 0838 09/10/20 1114  TROPONINIHS 3,062* 2,902* 2,457*   Hypertensive emergency Chronic diastolic CHF -Presented with blood pressure elevated to over 200 with chest pain, elevated troponin, nausea and vomiting.   -Patient reports inability to hold her medicines down because of nausea and vomiting. -Home meds include Coreg 25 mg twice daily, clonidine 0.4 mg twice daily, Lasix 40 mg daily, Imdur 30 mg daily, amlodipine 5 mg daily. -All other home meds were resumed.  Blood pressure stabilized. -Echo from 8/25 with EF 60 to 65%, mild LVH.  Intractable nausea vomiting -May have underlying gastroparesis. -Nausea vomiting improved with scheduled IV Reglan.  Able to tolerate full liquid diet.  Did not tolerate solid diet this morning.  She wanted to go back to full liquid diet and tolerated for lunch again today.  Uncontrolled type 2 diabetes mellitus Hyperglycemia -A1c 8.4  on 08/13/2020 -Home meds include Lantus 18 units at bedtime, Ozempic 0.5 mg every Saturday -Resume the same regimen post  discharge.  Morbid obesity  -Body mass index is 41.3 kg/m. Patient has been advised to make an attempt to improve diet and exercise patterns to aid in weight loss.  OSA - Continue home CPAP    History of CVA Hyperlipidemia - Continue home aspirin and Plavix and statin    Allergies as of 09/11/2020       Reactions   Amlodipine Benzoate Swelling, Other (See Comments)   Leg swelling   Contrast Media [iodinated Diagnostic Agents] Shortness Of Breath, Other (See Comments)   Difficulty breathing   Iodine Anaphylaxis   Iohexol Hives, Nausea And Vomiting, Swelling, Other (See Comments)    Desc: Magnevist-gadolinium-difficulty breathing, throat swelling   Midazolam Hcl Anaphylaxis   Difficulty breathing   Other Shortness Of Breath, Itching, Other (See Comments)   Patient is allergic to all nuts except peanuts, which are NOT "nuts"- they are legumes   Shellfish Allergy Anaphylaxis   Shellfish-derived Products Anaphylaxis   Valsartan Swelling   Metformin And Related Diarrhea, Nausea And Vomiting, Other (See Comments)   Dehydration, also   Farxiga [dapagliflozin] Nausea Only, Other (See Comments)   UTI, headaches and muscle aches, also   Hydralazine Other (See Comments)   Reaction not recalled   Saxagliptin Hives, Nausea And Vomiting, Other (See Comments)   ONGLYZA- dehydration, also   Spironolactone Other (See Comments)   Elevated potassium- Severe hyperkalemia   Avandia [rosiglitazone Maleate] Hives, Other (See Comments)   Geodon [ziprasidone] Other (See Comments)   Reaction not recalled   Kiwi Extract Itching, Swelling   Latex Itching        Medication List     TAKE these medications    albuterol 108 (90 Base) MCG/ACT inhaler Commonly known as: VENTOLIN HFA Inhale 2 puffs into the lungs every 4 (four) hours as needed for wheezing or shortness of breath.   allopurinol 100 MG tablet Commonly known as: ZYLOPRIM Take 100 mg by mouth at bedtime.   amLODipine 5 MG  tablet Commonly known as: NORVASC Take 1 tablet (5 mg total) by mouth daily.   aspirin EC 81 MG tablet Take 81 mg by mouth at bedtime. Swallow whole.   atorvastatin 40 MG tablet Commonly known as: LIPITOR Take 1 tablet (40 mg total) by mouth at bedtime.   Basaglar KwikPen 100 UNIT/ML Inject 22 Units into the skin at bedtime. What changed: how much to take   carvedilol 25 MG tablet Commonly known as: COREG Take 25 mg by mouth 2 (two) times daily.   cetirizine 10 MG tablet Commonly known as: ZYRTEC Take 10 mg by mouth daily.   cloNIDine 0.2 MG tablet Commonly known as: CATAPRES Take 0.4 mg by mouth 2 (two) times daily.   clopidogrel 75 MG tablet Commonly known as: Plavix Take 1 tablet (75 mg total) by mouth daily.   DULoxetine 20 MG capsule Commonly known as: CYMBALTA Take 20-40 mg by mouth See admin instructions. Take 20 mg by mouth in the morning and 40 mg at bedtime   EPINEPHrine 0.3 mg/0.3 mL Soaj injection Commonly known as: Auvi-Q Inject 0.3 mg into the muscle as needed for anaphylaxis. What changed: when to take this   famotidine 20 MG tablet Commonly known as: PEPCID Take 1 tablet (20 mg total) by mouth 2 (two) times daily.   FreeStyle [place] 2 [place] daily. as directed   FreeStyle [place] 2  Sensor Misc Inject 1 Device into the skin every 14 (fourteen) days.   furosemide 40 MG tablet Commonly known as: LASIX Take 40 mg by mouth in the morning.   gabapentin 300 MG capsule Commonly known as: NEURONTIN TAKE 2 CAPSULES BY MOUTH THREE TIMES DAILY What changed: when to take this   insulin aspart 100 UNIT/ML injection Commonly known as: novoLOG Inject 10-20 Units into the skin See admin instructions. Inject 10-20 units into the skin three times a day with meals, PER SLIDING SCALE   isosorbide mononitrate 30 MG 24 hr tablet Commonly known as: IMDUR Take 1 tablet (30 mg total) by mouth daily.   metoCLOPramide 10 MG tablet Commonly known as:  REGLAN Take 1 tablet (10 mg total) by mouth every 8 (eight) hours as needed for up to 5 days for nausea.   omeprazole 40 MG capsule Commonly known as: PRILOSEC Take 1 capsule by mouth once daily   Ozempic (1 MG/DOSE) 2 MG/1.5ML Sopn Generic drug: Semaglutide (1 MG/DOSE) Inject 1 mg into the skin once a week.   tiZANidine 2 MG tablet Commonly known as: ZANAFLEX Take 1 tablet (2 mg total) by mouth at bedtime.   traMADol 50 MG tablet Commonly known as: ULTRAM Take 1 tablet by mouth twice daily What changed:  when to take this reasons to take this   Vitamin D (Ergocalciferol) 1.25 MG (50000 UNIT) Caps capsule Commonly known as: DRISDOL Take 1 capsule (50,000 Units total) by mouth every 7 (seven) days.        Discharge Instructions:  Diet Recommendation: Follow with diet for next 1 to 2 days for regular consistency cardiac diet.   Follow with Primary MD [name], DO in 7 days   Get CBC/BMP checked in next visit within 1 week by PCP or SNF MD ( we routinely change or add medications that can affect your baseline labs and fluid status, therefore we recommend that you get the mentioned basic workup next visit with your PCP, your PCP may decide not to get them or add new tests based on their clinical decision)  On your next visit with your PCP, please Get Medicines reviewed and adjusted.  Please request your PCP  to go over all Hospital Tests and Procedure/Radiological results at the follow up, please get all Hospital records sent to your Prim MD by signing hospital release before you go home.  Activity: As tolerated with Full fall precautions use walker/cane & assistance as needed  For Heart failure patients - Check your Weight same time everyday, if you gain over 2 pounds, or you develop in leg swelling, experience more shortness of breath or chest pain, call your Primary MD immediately. Follow Cardiac Low Salt Diet and 1.5 lit/day fluid restriction.  If you have smoked or  chewed Tobacco in the last 2 yrs please stop smoking, stop any regular Alcohol  and or any Recreational drug use.  If you experience worsening of your admission symptoms, develop shortness of breath, life threatening emergency, suicidal or homicidal thoughts you must seek medical attention immediately by calling 911 or calling your MD immediately  if symptoms less severe.  You Must read complete instructions/literature along with all the possible adverse reactions/side effects for all the Medicines you take and that have been prescribed to you. Take any new Medicines after you have completely understood and accpet all the possible adverse reactions/side effects.   Do not drive, operate heavy machinery, perform activities at heights, swimming or participation in water activities  or provide baby sitting services if your were admitted for syncope or siezures until you have seen by Primary MD or a Neurologist and advised to do so again.  Do not drive when taking Pain medications.  Do not take more than prescribed Pain, Sleep and Anxiety Medications  Wear Seat belts while driving.   Please note You were cared for by a hospitalist during your hospital stay. If you have any questions about your discharge medications or the care you received while you were in the hospital after you are discharged, you can call the unit and asked to speak with the hospitalist on call if the hospitalist that took care of you is not available. Once you are discharged, your primary care physician will handle any further medical issues. Please note that NO REFILLS for any discharge medications will be authorized once you are discharged, as it is imperative that you return to your primary care physician (or establish a relationship with a primary care physician if you do not have one) for your aftercare needs so that they can reassess your need for medications and monitor your lab values.    Follow ups:    Follow-up  Information     [name], DO Follow up.   Specialty: Family Medicine Contact information: [address] [place] [address] [phone]         Nigel Mormon, MD Follow up.   Specialties: Cardiology, Radiology Contact information: [place] [address] [phone]                 Wound care:     Discharge Exam:   Vitals:   09/10/20 2357 09/11/20 0341 09/11/20 0600 09/11/20 0720  BP: 127/72 133/69  124/65  Pulse: 78 77  83  Resp: '20 20  14  '$ Temp: 98.4 F (36.9 C) 97.8 F (36.6 C)  98.2 F (36.8 C)  TempSrc: Oral Oral  Oral  SpO2: 97% 96%  96%  Weight:   116.1 kg   Height:        Body mass index is 41.3 kg/m.  General exam: Pleasant middle-aged African-American female.  Not in distress Skin: No rashes, lesions or ulcers. HEENT: Atraumatic, normocephalic, no obvious bleeding Lungs: Clear to auscultation bilaterally CVS: Regular rate and rhythm, no murmur GI/Abd soft, nontender, nondistended, bowel sound present CNS: Alert, awake, oriented x3 Psychiatry: Mood appropriate Extremities: No pedal edema, no calf tenderness  Time coordinating discharge: 35 minutes   The results of significant diagnostics from this hospitalization (including imaging, microbiology, ancillary and laboratory) are listed below for reference.    Procedures and Diagnostic Studies:   DG Chest 2 View  Result Date: 09/09/2020 CLINICAL DATA:  Chest pain. EXAM: CHEST - 2 VIEW COMPARISON:  Chest radiograph dated 09/05/2020. FINDINGS: Mild cardiomegaly. No focal consolidation, pleural effusion, or pneumothorax. No acute osseous pathology. IMPRESSION: No active cardiopulmonary disease. Electronically Signed   By: Anner Crete M.D.   On: 09/09/2020 20:39   CT HEAD WO CONTRAST (5MM)  Result Date: 09/09/2020 CLINICAL DATA:  TIA EXAM: CT HEAD WITHOUT CONTRAST TECHNIQUE: Contiguous axial images were obtained from the base  of the skull through the vertex without intravenous contrast. COMPARISON:  MRI brain dated 08/28/2020 FINDINGS: Brain: No evidence of acute infarction, hemorrhage, hydrocephalus, extra-axial collection or mass lesion/mass effect. Subcortical white matter and periventricular small vessel ischemic changes. Old left basal ganglia lacunar infarct. Vascular: Mild intracranial atherosclerosis. Skull: Normal. Negative for fracture or  focal lesion. Sinuses/Orbits: The visualized paranasal sinuses are essentially clear. The mastoid air cells are unopacified. Other: None. IMPRESSION: No evidence of acute intracranial abnormality. Small vessel ischemic changes. Old left basal ganglia lacunar infarct. Electronically Signed   By: Julian Hy M.D.   On: 09/09/2020 19:58   ECHOCARDIOGRAM LIMITED  Result Date: 09/10/2020    ECHOCARDIOGRAM LIMITED REPORT   Patient Name:   RAJ WHITMER Date of Exam: 09/10/2020 Medical Rec #:  [id number]          Height:       66.0 in Accession #:    [id number]         Weight:       255.0 lb Date of Birth:  [birth year]           BSA:          2.217 m Patient Age:    74 years           BP:           119/50 mmHg Patient Gender: F                  HR:           76 bpm. Exam Location:  Inpatient Procedure: Limited Echo Indications:     R07.9* Chest pain, unspecified  History:         Patient has prior history of Echocardiogram examinations, most                  recent 09/06/2020. CAD; Risk Factors:Hypertension, Diabetes,                  Dyslipidemia and Sleep Apnea.  Sonographer:     Raquel Sarna Senior RDCS Referring Phys:  [id number] [place] Diagnosing Phys: Vernell Leep MD IMPRESSIONS  1. Limited study. Left ventricular ejection fraction, by estimation, is 55 to 60%. The left ventricle has normal function. The left ventricle has no regional wall motion abnormalities.  2. No significant change compared to recent complete study on 09/06/2020. FINDINGS  Left Ventricle: Left ventricular  ejection fraction, by estimation, is 55 to 60%. The left ventricle has normal function. The left ventricle has no regional wall motion abnormalities. Vernell Leep MD Electronically signed by Vernell Leep MD Signature Date/Time: 09/10/2020/2:12:57 PM    Final      Labs:   Basic Metabolic Panel: Recent Labs  Lab 09/05/20 1137 09/05/20 1751 09/05/20 2104 09/06/20 0254 09/09/20 1840 09/10/20 0237 09/10/20 0238 09/11/20 0020  NA 137 126*  --  133* 138  --  139 136  K 4.5 3.8  --  3.9 3.3*  --  3.4* 3.9  CL 104  --   --  101 102  --  103 101  CO2 26  --   --  22 26  --  27 27  GLUCOSE 238*  --   --  456* 111*  --  120* 190*  BUN 11  --   --  15 14  --  12 16  CREATININE 1.23*  --  1.17* 1.29* 1.11*  --  1.02* 1.26*  CALCIUM 8.7*  --   --  8.3* 8.9  --  8.3* 8.5*  MG  --   --   --   --   --  1.6*  --   --    GFR Estimated Creatinine Clearance: 68.4 mL/min (A) (by C-G formula based on SCr of 1.26 mg/dL (H)). Liver Function Tests: Recent  Labs  Lab 09/09/20 1840  AST 17  ALT 16  ALKPHOS 57  BILITOT 0.7  PROT 6.6  ALBUMIN 2.8*   Recent Labs  Lab 09/09/20 1840  LIPASE 27   No results for input(s): AMMONIA in the last 168 hours. Coagulation profile Recent Labs  Lab 09/05/20 1424  INR 0.9    CBC: Recent Labs  Lab 09/05/20 2104 09/06/20 0254 09/09/20 1840 09/10/20 0237 09/11/20 0020  WBC 11.7* 9.9 7.6 7.2 5.7  NEUTROABS  --   --  5.3  --  2.9  HGB 13.0 11.9* 12.9 12.6 11.2*  HCT 38.9 35.5* 39.7 40.6 34.3*  MCV 82.4 82.6 83.8 86.9 84.3  PLT 295 284 364 348 299   Cardiac Enzymes: No results for input(s): CKTOTAL, CKMB, CKMBINDEX, TROPONINI in the last 168 hours. BNP: Invalid input(s): POCBNP CBG: Recent Labs  Lab 09/06/20 1141 09/10/20 0817 09/10/20 1142 09/10/20 1836 09/11/20 0728  GLUCAP 481* 194* 197* 277* 188*   D-Dimer No results for input(s): DDIMER in the last 72 hours. Hgb A1c No results for input(s): HGBA1C in the last 72  hours. Lipid Profile No results for input(s): CHOL, HDL, LDLCALC, TRIG, CHOLHDL, LDLDIRECT in the last 72 hours. Thyroid function studies No results for input(s): TSH, T4TOTAL, T3FREE, THYROIDAB in the last 72 hours.  Invalid input(s): FREET3 Anemia work up No results for input(s): VITAMINB12, FOLATE, FERRITIN, TIBC, IRON, RETICCTPCT in the last 72 hours. Microbiology Recent Results (from the past 240 hour(s))  Resp Panel by RT-PCR (Flu A&B, Covid) Nasopharyngeal Swab     Status: None   Collection Time: 09/05/20  2:23 PM   Specimen: Nasopharyngeal Swab; Nasopharyngeal(NP) swabs in vial transport medium  Result Value Ref Range Status   SARS Coronavirus 2 by RT PCR NEGATIVE NEGATIVE Final    Comment: (NOTE) SARS-CoV-2 target nucleic acids are NOT DETECTED.  The SARS-CoV-2 RNA is generally detectable in upper respiratory specimens during the acute phase of infection. The lowest concentration of SARS-CoV-2 viral copies this assay can detect is 138 copies/mL. A negative result does not preclude SARS-Cov-2 infection and should not be used as the sole basis for treatment or other patient management decisions. A negative result may occur with  improper specimen collection/handling, submission of specimen other than nasopharyngeal swab, presence of viral mutation(s) within the areas targeted by this assay, and inadequate number of viral copies(<138 copies/mL). A negative result must be combined with clinical observations, patient history, and epidemiological information. The expected result is Negative.  Fact Sheet for Patients:  EntrepreneurPulse.com.au  Fact Sheet for Healthcare Providers:  IncredibleEmployment.be  This test is no t yet approved or cleared by the [place] FDA and  has been authorized for detection and/or diagnosis of SARS-CoV-2 by FDA under an Emergency Use Authorization (EUA). This EUA will remain  in effect (meaning this  test can be used) for the duration of the COVID-19 declaration under Section 564(b)(1) of the Act, 21 U.S.C.section 360bbb-3(b)(1), unless the authorization is terminated  or revoked sooner.       Influenza A by PCR NEGATIVE NEGATIVE Final   Influenza B by PCR NEGATIVE NEGATIVE Final    Comment: (NOTE) The Xpert Xpress SARS-CoV-2/FLU/RSV plus assay is intended as an aid in the diagnosis of influenza from Nasopharyngeal swab specimens and should not be used as a sole basis for treatment. Nasal washings and aspirates are unacceptable for Xpert Xpress SARS-CoV-2/FLU/RSV testing.  Fact Sheet for Patients: EntrepreneurPulse.com.au  Fact Sheet for Healthcare Providers: IncredibleEmployment.be  This  test is not yet approved or cleared by the [place] and has been authorized for detection and/or diagnosis of SARS-CoV-2 by FDA under an Emergency Use Authorization (EUA). This EUA will remain in effect (meaning this test can be used) for the duration of the COVID-19 declaration under Section 564(b)(1) of the Act, 21 U.S.C. section 360bbb-3(b)(1), unless the authorization is terminated or revoked.  Performed at [facility], [place] [address], [place], [place] [address]   Resp Panel by RT-PCR (Flu A&B, Covid) Nasopharyngeal Swab     Status: None   Collection Time: 09/09/20 11:14 PM   Specimen: Nasopharyngeal Swab; Nasopharyngeal(NP) swabs in vial transport medium  Result Value Ref Range Status   SARS Coronavirus 2 by RT PCR NEGATIVE NEGATIVE Final    Comment: (NOTE) SARS-CoV-2 target nucleic acids are NOT DETECTED.  The SARS-CoV-2 RNA is generally detectable in upper respiratory specimens during the acute phase of infection. The lowest concentration of SARS-CoV-2 viral copies this assay can detect is 138 copies/mL. A negative result does not preclude SARS-Cov-2 infection and should not be used as the sole basis for treatment or other  patient management decisions. A negative result may occur with  improper specimen collection/handling, submission of specimen other than nasopharyngeal swab, presence of viral mutation(s) within the areas targeted by this assay, and inadequate number of viral copies(<138 copies/mL). A negative result must be combined with clinical observations, patient history, and epidemiological information. The expected result is Negative.  Fact Sheet for Patients:  EntrepreneurPulse.com.au  Fact Sheet for Healthcare Providers:  IncredibleEmployment.be  This test is no t yet approved or cleared by the [place] FDA and  has been authorized for detection and/or diagnosis of SARS-CoV-2 by FDA under an Emergency Use Authorization (EUA). This EUA will remain  in effect (meaning this test can be used) for the duration of the COVID-19 declaration under Section 564(b)(1) of the Act, 21 U.S.C.section 360bbb-3(b)(1), unless the authorization is terminated  or revoked sooner.       Influenza A by PCR NEGATIVE NEGATIVE Final   Influenza B by PCR NEGATIVE NEGATIVE Final    Comment: (NOTE) The Xpert Xpress SARS-CoV-2/FLU/RSV plus assay is intended as an aid in the diagnosis of influenza from Nasopharyngeal swab specimens and should not be used as a sole basis for treatment. Nasal washings and aspirates are unacceptable for Xpert Xpress SARS-CoV-2/FLU/RSV testing.  Fact Sheet for Patients: EntrepreneurPulse.com.au  Fact Sheet for Healthcare Providers: IncredibleEmployment.be  This test is not yet approved or cleared by the [place] FDA and has been authorized for detection and/or diagnosis of SARS-CoV-2 by FDA under an Emergency Use Authorization (EUA). This EUA will remain in effect (meaning this test can be used) for the duration of the COVID-19 declaration under Section 564(b)(1) of the Act, 21 U.S.C. section  360bbb-3(b)(1), unless the authorization is terminated or revoked.  Performed at [facility], [place] [address], [place], [place] [address]      Signed: Terrilee Croak  [facility] 09/11/2020, 10:22 AM

## 2020-09-13 ENCOUNTER — Other Ambulatory Visit: Payer: Self-pay

## 2020-09-13 ENCOUNTER — Encounter: Payer: Self-pay | Admitting: Cardiology

## 2020-09-13 ENCOUNTER — Ambulatory Visit: Payer: Federal, State, Local not specified - PPO | Admitting: Cardiology

## 2020-09-13 VITALS — BP 148/88 | HR 87 | Temp 98.0°F | Resp 16 | Ht 66.0 in | Wt 242.0 lb

## 2020-09-13 DIAGNOSIS — I10 Essential (primary) hypertension: Secondary | ICD-10-CM

## 2020-09-13 DIAGNOSIS — I5189 Other ill-defined heart diseases: Secondary | ICD-10-CM

## 2020-09-13 DIAGNOSIS — I252 Old myocardial infarction: Secondary | ICD-10-CM | POA: Diagnosis not present

## 2020-09-13 MED ORDER — ISOSORBIDE MONONITRATE ER 60 MG PO TB24
60.0000 mg | ORAL_TABLET | Freq: Every day | ORAL | 3 refills | Status: DC
Start: 2020-09-13 — End: 2021-01-25

## 2020-09-13 NOTE — Progress Notes (Signed)
Primary Physician:  [name], DO  Patient ID: Tina [name], female    DOB: 1970/07/11, 50 y.o.   MRN: [id number]  Date: 09/13/20 Last Office Visit: 10/17/2019  Subjective:   Chief Complaint  Patient presents with   Diastolic dysfunction without heart failure   Follow-up   Hypertension     HPI: Tina [name]  is a [name]  with history of CVA with residual right-sided hemiparesis which is essentially improved and does very minimal residual defect, insulin dependent diabetes mellitus, obesity, hypertension, obesity comes in to the office with a chief complaint of " hospital follow-up.   Patient was last seen in our office 04/20/2020 by Dr. Terri Skains for follow-up of diastolic dysfunction.  Office visit patient has had multiple hospital admissions.  First 08/13/2020 admitted with angioedema of unclear etiology, however suspicious it was related to Imdur and therefore she was advised to discontinue it at that time.  Patient then admitted 09/05/2020 - 09/06/2020 with NSTEMI and underwent left heart catheterization with angioplasty and stenting to OM 2.  Patient most recently admitted 09/09/2020 - 09/11/2020 with hypertensive urgency as she was unable to keep down her medications due to nausea and vomiting.  Patient now presents for follow-up. Overall patient is feeling much improved. Patient's nausea and vomiting have resolved.  She has had no recurrence of chest pain since admission to the hospital 09/05/2020.  She is presently tolerating medications including amlodipine and Imdur without issue.  Patient walks for about 10 consecutive minutes 2-3 times daily without issue.   She is allergic/intolerant to multiple medications.  She was on valsartan in the past but this was discontinued due to swelling.  Spironolactone caused her to have hyperkalemia.  Hydralazine is listed as an allergy but does not remember its side effect profile.   Past Medical History:  Diagnosis  Date   Allergy    Anemia    DURING MENSES--HAS HEAVY BLEEDING WITH PERODS   Angioedema 08/13/2020   Anxiety    Back pain, chronic    "ongoing"   Blood transfusion    IN 2012  AFTER C -SECTION   Cerebral thrombosis with cerebral infarction [facility]) JUNE 2011   RIGHT SIDED WEAKNESS ( ARM AND LEG ) AND SPASMS-remains with slight weakness and vertigo.   Constipation    Depression    Diabetes mellitus    Diabetic neuropathy (HCC)    BOTH FEET --COMES AND GOES   Edema, lower extremity    Endometriosis    Fatty liver    GERD (gastroesophageal reflux disease)    with pregnancy   H/O eye surgery    Headache(784.0)    MIGRAINES--NOT REALLY HEADACHE-MORE LIKE PRESSURE SENSATION IN HEAD-FEELS DIZZIY AND  FAINT AS THE PRESSURE RESOLVES   Hernia, incisional, RLQ, s/p lap repair Sep 2013 09/02/2011   History of vertigo 03/22/2018   Hx of migraines 10/19/2011   Hypertension    Leg pain, right    "like bad Charley horse"   Multiple food allergies    Panic disorder without agoraphobia    Rash    HANDS, ARMS --STATES HX OF RASH EVER SINCE CHILDBIRTH/PREGNANCY.  STATES THE RASH OFTEN OCCURS WHEN SHE IS REALLY STRESSED."goes and comes-presently left ring finger"   Restless leg syndrome    DIAGNOSED BY SLEEP STUDY - PT TOLD SHE DID NOT HAVE SLEEP APNEA   Right rotator cuff tear    PAIN IN RIGHT SHOULDER   SBO (small bowel obstruction) ([place]) 06/03/2012  Shortness of breath    Spastic hemiplegia affecting dominant side (HCC)    Stomach problems    Stroke ([place])    Ventral hernia    RIGHT LOWER QUADRANT-CAUSING SOME PAIN   Weakness of right side of body     Past Surgical History:  Procedure Laterality Date   APPLICATION OF WOUND VAC N/A 05/25/2015   Procedure: APPLICATION OF WOUND VAC;  Surgeon: Michael Boston, MD;  Location: [facility];  Service: General;  Laterality: N/A;   CESAREAN SECTION  2012   COLONOSCOPY     CORONARY STENT INTERVENTION N/A 09/05/2020   Procedure: CORONARY STENT INTERVENTION;   Surgeon: Nigel Mormon, MD;  Location: [place] [facility];  Service: Cardiovascular;  Laterality: N/A;   DIAGNOSTIC LAPAROSCOPY     ESOPHAGOGASTRODUODENOSCOPY N/A 06/03/2012   Procedure: ESOPHAGOGASTRODUODENOSCOPY (EGD);  Surgeon: [name] Craver, MD;  Location: [facility] ENDOSCOPY;  Service: Endoscopy;  Laterality: N/A;   EXCISION MASS ABDOMINAL N/A 05/25/2015   Procedure: ABDOMINAL WALL EXPLORATION EXCISION OF SEROMA REMOVAL OF REDUNDANT SKIN ;  Surgeon: Michael Boston, MD;  Location: [facility];  Service: General;  Laterality: N/A;   EYE SURGERY     Eye laser for vessel hemorrhaging   fybroid removal     HERNIA REPAIR  10/03/11   ventral hernia repair   INSERTION OF MESH N/A 02/04/2013   Procedure: INSERTION OF MESH;  Surgeon: Adin Hector, MD;  Location: [facility];  Service: General;  Laterality: N/A;   INTRAVASCULAR ULTRASOUND/IVUS N/A 09/05/2020   Procedure: Intravascular Ultrasound/IVUS;  Surgeon: Nigel Mormon, MD;  Location: [place] [facility];  Service: Cardiovascular;  Laterality: N/A;   LEFT HEART CATH AND CORONARY ANGIOGRAPHY N/A 09/05/2020   Procedure: LEFT HEART CATH AND CORONARY ANGIOGRAPHY;  Surgeon: Nigel Mormon, MD;  Location: [place] [facility];  Service: Cardiovascular;  Laterality: N/A;   UMBILICAL HERNIA REPAIR N/A 02/04/2013   Procedure: LAPAROSCOPIC ventral wall hernia repair LAPAROSCOPIC LYSIS OF ADHESIONS laparoscopic exploration of abdomen ;  Surgeon: Adin Hector, MD;  Location: [facility];  Service: General;  Laterality: N/A;   UPPER GASTROINTESTINAL ENDOSCOPY     URETER REVISION     Bilateral "twisted"   UTERINE FIBROID SURGERY     2 SURGERIES FOR FIBROIDS   VENTRAL HERNIA REPAIR  10/03/2011   Procedure: LAPAROSCOPIC VENTRAL HERNIA;  Surgeon: Adin Hector, MD;  Location: [facility];  Service: General;  Laterality: N/A;   Family History  Problem Relation Age of Onset   Diabetes Father    Kidney disease Father    Depression Father    Drug abuse Father     Allergic rhinitis Mother    Eczema Mother    Urticaria Mother    Depression Mother    Anxiety disorder Mother    Bipolar disorder Mother    Alcoholism Mother    Drug abuse Mother    Eating disorder Mother    Diabetes Maternal Grandmother    Hyperlipidemia Paternal Grandmother    Stroke Paternal Grandmother    Eczema Sister    Urticaria Sister    Colon cancer Paternal Uncle    Other Neg Hx    Angioedema Neg Hx    Asthma Neg Hx    Colon polyps Neg Hx    Esophageal cancer Neg Hx    Rectal cancer Neg Hx    Stomach cancer Neg Hx    Social History   Tobacco Use   Smoking status: Never   Smokeless tobacco: Never  Vaping Use   Vaping Use: Never used  Substance Use Topics   Alcohol use: No    Alcohol/week: 0.0 standard drinks   Drug use: No   ROS   Review of Systems  Constitutional: Negative for malaise/fatigue.  Cardiovascular:  Negative for chest pain, claudication, leg swelling, near-syncope, orthopnea, palpitations, paroxysmal nocturnal dyspnea and syncope.  Respiratory:  Positive for sleep disturbances due to breathing. Negative for shortness of breath.   Neurological:  Negative for dizziness.     Objective:  Blood pressure (!) 148/88, pulse 87, temperature 98 F (36.7 C), resp. rate 16, height 5' 6" (1.676 m), weight 242 lb (109.8 kg), SpO2 96 %. Body mass index is 39.06 kg/m.    Physical Exam Vitals reviewed.  Constitutional:      Appearance: She is well-developed.  HENT:     Head: Normocephalic and atraumatic.  Cardiovascular:     Rate and Rhythm: Normal rate and regular rhythm.     Pulses: Intact distal pulses.          Femoral pulses are 2+ on the right side and 2+ on the left side.      Popliteal pulses are 2+ on the right side and 2+ on the left side.       Dorsalis pedis pulses are 2+ on the right side and 2+ on the left side.       Posterior tibial pulses are 2+ on the right side and 2+ on the left side.     Heart sounds: S1 normal and S2 normal. No  murmur heard.   No gallop.     Comments: Radial access site well-healed without erythema, ecchymosis, hematoma, or bruit. Pulmonary:     Effort: Pulmonary effort is normal. No accessory muscle usage or respiratory distress.     Breath sounds: Normal breath sounds. No wheezing or rales.  Abdominal:     General: Bowel sounds are normal.     Palpations: Abdomen is soft.  Musculoskeletal:        General: Normal range of motion.     Cervical back: Normal range of motion.  Skin:    General: Skin is warm and dry.  Neurological:     Mental Status: She is alert and oriented to person, place, and time.   Laboratory examination:    CMP Latest Ref Rng & Units 09/11/2020 09/10/2020 09/09/2020  Glucose 70 - 99 mg/dL 190(H) 120(H) 111(H)  BUN 6 - 20 mg/dL _0 Creatinine 0.44 - 1.00 mg/dL 1.26(H) 1.02(H) 1.11(H)  Sodium 135 - 145 mmol/L 136 139 138  Potassium 3.5 - 5.1 mmol/L 3.9 3.4(L) 3.3(L)  Chloride 98 - 111 mmol/L 101 103 102  CO2 22 - 32 mmol/L _1 Calcium 8.9 - 10.3 mg/dL 8.5(L) 8.3(L) 8.9  Total Protein 6.5 - 8.1 g/dL - - 6.6  Total Bilirubin 0.3 - 1.2 mg/dL - - 0.7  Alkaline Phos 38 - 126 U/L - - 57  AST 15 - 41 U/L - - 17  ALT 0 - 44 U/L - - 16   CBC Latest Ref Rng & Units 09/11/2020 09/10/2020 09/09/2020  WBC 4.0 - 10.5 K/uL 5.7 7.2 7.6  Hemoglobin 12.0 - 15.0 g/dL 11.2(L) 12.6 12.9  Hematocrit 36.0 - 46.0 % 34.3(L) 40.6 39.7  Platelets 150 - 400 K/uL 299 348 364   Lipid Panel     Component Value Date/Time   CHOL 175 09/06/2020 0254   CHOL 169 02/15/2018 1034   TRIG  137 09/06/2020 0254   HDL 56 09/06/2020 0254   HDL 41 02/15/2018 1034   CHOLHDL 3.1 09/06/2020 0254   VLDL 27 09/06/2020 0254   LDLCALC 92 09/06/2020 0254   LDLCALC 94 02/15/2018 1034   HEMOGLOBIN A1C Lab Results  Component Value Date   HGBA1C 8.4 (H) 08/13/2020   MPG 194.38 08/13/2020   TSH No results for input(s): TSH in the last 8760 hours.  Allergies   Allergies  Allergen Reactions    Amlodipine Benzoate Swelling and Other (See Comments)    Leg swelling   Contrast Media [Iodinated Diagnostic Agents] Shortness Of Breath and Other (See Comments)    Difficulty breathing   Iodine Anaphylaxis   Iohexol Hives, Nausea And Vomiting, Swelling and Other (See Comments)     Desc: Magnevist-gadolinium-difficulty breathing, throat swelling    Midazolam Hcl Anaphylaxis    Difficulty breathing   Other Shortness Of Breath, Itching and Other (See Comments)    Patient is allergic to all nuts except peanuts, which are NOT "nuts"- they are legumes   Shellfish Allergy Anaphylaxis   Shellfish-Derived Products Anaphylaxis   Valsartan Swelling   Metformin And Related Diarrhea, Nausea And Vomiting and Other (See Comments)    Dehydration, also   [place] [Dapagliflozin] Nausea Only and Other (See Comments)    UTI, headaches and muscle aches, also   Hydralazine Other (See Comments)    Reaction not recalled   Saxagliptin Hives, Nausea And Vomiting and Other (See Comments)    ONGLYZA- dehydration, also   Spironolactone Other (See Comments)    Elevated potassium- Severe hyperkalemia   Avandia [Rosiglitazone Maleate] Hives and Other (See Comments)   Geodon [Ziprasidone] Other (See Comments)    Reaction not recalled   Kiwi Extract Itching and Swelling   Latex Itching    Medications Prior to Visit:   Outpatient Medications Prior to Visit  Medication Sig Dispense Refill   albuterol (VENTOLIN HFA) 108 (90 Base) MCG/ACT inhaler Inhale 2 puffs into the lungs every 4 (four) hours as needed for wheezing or shortness of breath. 18 g 1   allopurinol (ZYLOPRIM) 100 MG tablet Take 100 mg by mouth at bedtime.     amLODipine (NORVASC) 5 MG tablet Take 1 tablet (5 mg total) by mouth daily. 90 tablet 0   aspirin EC 81 MG tablet Take 81 mg by mouth at bedtime. Swallow whole.     atorvastatin (LIPITOR) 40 MG tablet Take 1 tablet (40 mg total) by mouth at bedtime. 90 tablet 0   carvedilol (COREG) 25 MG  tablet Take 25 mg by mouth 2 (two) times daily.     cetirizine (ZYRTEC) 10 MG tablet Take 10 mg by mouth daily.     cloNIDine (CATAPRES) 0.2 MG tablet Take 0.4 mg by mouth 2 (two) times daily.     clopidogrel (PLAVIX) 75 MG tablet Take 1 tablet (75 mg total) by mouth daily. 30 tablet 1   Continuous Blood Gluc Receiver (FREESTYLE LIBRE 2 READER) DEVI daily. as directed     Continuous Blood Gluc Sensor (FREESTYLE LIBRE 2 SENSOR) MISC Inject 1 Device into the skin every 14 (fourteen) days.     DULoxetine (CYMBALTA) 20 MG capsule Take 20-40 mg by mouth See admin instructions. Take 20 mg by mouth in the morning and 40 mg at bedtime     EPINEPHrine (AUVI-Q) 0.3 mg/0.3 mL IJ SOAJ injection Inject 0.3 mg into the muscle as needed for anaphylaxis. (Patient taking differently: Inject 0.3 mg into the muscle once  as needed for anaphylaxis.) 1 each 1   famotidine (PEPCID) 20 MG tablet Take 1 tablet (20 mg total) by mouth 2 (two) times daily. 60 tablet 5   furosemide (LASIX) 40 MG tablet Take 40 mg by mouth in the morning.     gabapentin (NEURONTIN) 300 MG capsule TAKE 2 CAPSULES BY MOUTH THREE TIMES DAILY (Patient taking differently: Take 600 mg by mouth at bedtime.) 180 capsule 2   insulin aspart (NOVOLOG) 100 UNIT/ML injection Inject 10-20 Units into the skin See admin instructions. Inject 10-20 units into the skin three times a day with meals, PER SLIDING SCALE     Insulin Glargine (BASAGLAR KWIKPEN) 100 UNIT/ML Inject 22 Units into the skin at bedtime. (Patient taking differently: Inject 18 Units into the skin at bedtime.)     metoCLOPramide (REGLAN) 10 MG tablet Take 1 tablet (10 mg total) by mouth every 8 (eight) hours as needed for up to 5 days for nausea. 15 tablet 0   omeprazole (PRILOSEC) 40 MG capsule Take 1 capsule by mouth once daily (Patient taking differently: Take 40 mg by mouth daily.) 90 capsule 0   Semaglutide, 1 MG/DOSE, (OZEMPIC, 1 MG/DOSE,) 2 MG/1.5ML SOPN Inject 1 mg into the skin once a  week.     tiZANidine (ZANAFLEX) 2 MG tablet Take 1 tablet (2 mg total) by mouth at bedtime. 30 tablet 2   traMADol (ULTRAM) 50 MG tablet Take 1 tablet by mouth twice daily (Patient taking differently: Take 50 mg by mouth daily as needed for moderate pain.) 60 tablet 4   Vitamin D, Ergocalciferol, (DRISDOL) 1.25 MG (50000 UNIT) CAPS capsule Take 1 capsule (50,000 Units total) by mouth every 7 (seven) days. 4 capsule 0   isosorbide mononitrate (IMDUR) 30 MG 24 hr tablet Take 1 tablet (30 mg total) by mouth daily. 30 tablet 0   No facility-administered medications prior to visit.   Final Medications at End of Visit    Current Meds  Medication Sig   albuterol (VENTOLIN HFA) 108 (90 Base) MCG/ACT inhaler Inhale 2 puffs into the lungs every 4 (four) hours as needed for wheezing or shortness of breath.   allopurinol (ZYLOPRIM) 100 MG tablet Take 100 mg by mouth at bedtime.   amLODipine (NORVASC) 5 MG tablet Take 1 tablet (5 mg total) by mouth daily.   aspirin EC 81 MG tablet Take 81 mg by mouth at bedtime. Swallow whole.   atorvastatin (LIPITOR) 40 MG tablet Take 1 tablet (40 mg total) by mouth at bedtime.   carvedilol (COREG) 25 MG tablet Take 25 mg by mouth 2 (two) times daily.   cetirizine (ZYRTEC) 10 MG tablet Take 10 mg by mouth daily.   cloNIDine (CATAPRES) 0.2 MG tablet Take 0.4 mg by mouth 2 (two) times daily.   clopidogrel (PLAVIX) 75 MG tablet Take 1 tablet (75 mg total) by mouth daily.   Continuous Blood Gluc Receiver (FREESTYLE LIBRE 2 READER) DEVI daily. as directed   Continuous Blood Gluc Sensor (FREESTYLE LIBRE 2 SENSOR) MISC Inject 1 Device into the skin every 14 (fourteen) days.   DULoxetine (CYMBALTA) 20 MG capsule Take 20-40 mg by mouth See admin instructions. Take 20 mg by mouth in the morning and 40 mg at bedtime   EPINEPHrine (AUVI-Q) 0.3 mg/0.3 mL IJ SOAJ injection Inject 0.3 mg into the muscle as needed for anaphylaxis. (Patient taking differently: Inject 0.3 mg into the  muscle once as needed for anaphylaxis.)   famotidine (PEPCID) 20 MG tablet Take 1  tablet (20 mg total) by mouth 2 (two) times daily.   furosemide (LASIX) 40 MG tablet Take 40 mg by mouth in the morning.   gabapentin (NEURONTIN) 300 MG capsule TAKE 2 CAPSULES BY MOUTH THREE TIMES DAILY (Patient taking differently: Take 600 mg by mouth at bedtime.)   insulin aspart (NOVOLOG) 100 UNIT/ML injection Inject 10-20 Units into the skin See admin instructions. Inject 10-20 units into the skin three times a day with meals, PER SLIDING SCALE   Insulin Glargine (BASAGLAR KWIKPEN) 100 UNIT/ML Inject 22 Units into the skin at bedtime. (Patient taking differently: Inject 18 Units into the skin at bedtime.)   metoCLOPramide (REGLAN) 10 MG tablet Take 1 tablet (10 mg total) by mouth every 8 (eight) hours as needed for up to 5 days for nausea.   omeprazole (PRILOSEC) 40 MG capsule Take 1 capsule by mouth once daily (Patient taking differently: Take 40 mg by mouth daily.)   Semaglutide, 1 MG/DOSE, (OZEMPIC, 1 MG/DOSE,) 2 MG/1.5ML SOPN Inject 1 mg into the skin once a week.   tiZANidine (ZANAFLEX) 2 MG tablet Take 1 tablet (2 mg total) by mouth at bedtime.   traMADol (ULTRAM) 50 MG tablet Take 1 tablet by mouth twice daily (Patient taking differently: Take 50 mg by mouth daily as needed for moderate pain.)   Vitamin D, Ergocalciferol, (DRISDOL) 1.25 MG (50000 UNIT) CAPS capsule Take 1 capsule (50,000 Units total) by mouth every 7 (seven) days.   [DISCONTINUED] isosorbide mononitrate (IMDUR) 30 MG 24 hr tablet Take 1 tablet (30 mg total) by mouth daily.   Radiology   No results found.  Cardiac Studies:   EKG: 09/13/2020: Normal sinus rhythm, 84 bpm, T wave inversion in inferolateral leads, similar findings on prior ECG.   Echocardiogram 09/10/2020:   1. Limited study. Left ventricular ejection fraction, by estimation, is  55 to 60%. The left ventricle has normal function. The left ventricle has  no regional wall  motion abnormalities.   2. No significant change compared to recent complete study on 09/06/2020.   Echocardiogram 09/06/2020:   1. Left ventricular ejection fraction, by estimation, is 60 to 65%. The  left ventricle has normal function. The left ventricle has no regional  wall motion abnormalities. There is mild left ventricular hypertrophy.  Left ventricular diastolic parameters  are indeterminate. Elevated left atrial pressure.   2. Right ventricular systolic function is normal. The right ventricular  size is normal.   3. The mitral valve is grossly normal. No evidence of mitral valve  regurgitation. No evidence of mitral stenosis.   4. The aortic valve is tricuspid. Aortic valve regurgitation is not  visualized. Mild aortic valve sclerosis is present, with no evidence of  aortic valve stenosis.   5. The inferior vena cava is normal in size with greater than 50%  respiratory variability, suggesting right atrial pressure of 3 mmHg.  Left heart cath and coronary angiography 09/05/2020:  LM: Normal LAD: Distal apical 70% stenosis (non-culprit, very small caliber) Lcx: Mid Lcx 30% disease with just after a small OM1 branch (non-culprit)        Prox OM2 with 95% stenosis (culprit) RCA: Small caliber PDA with diffuse 60% disease LVEF 50-55% with apical inferolateral hypokinesis   Successful percutaneous coronary intervention OM2        PTCA and stent placement 2.25 X 12 mm Onyx drug-eluting stent        IVUS guided Post dilatation using 2.25 mm [place] at 22 atm and 2.5 mm [place]  at 16 atm   Lexiscan myoview stress test 08/21/2017:  1. Lexiscan stress test was performed. Exercise capacity was not assessed. Resting BP 148/90 mmHg, peak effect BP 168/90 mmHg. Stress symptoms included dizziness, nausea, headache, chest tightness.  2. The overall quality of the study is good. There is no evidence of abnormal lung activity. Stress and rest SPECT images demonstrate homogeneous tracer distribution  throughout the myocardium. Gated SPECT imaging reveals normal myocardial thickening and wall motion. The left ventricular ejection fraction was normal calculated as 45%, although visually appears normal.  3. Low risk study.   ABI 08/21/2017: This exam reveals normal perfusion of both the  lower extremity (RABI 1.27 and LABI 1.20 with biphasic waveform).  ABI may be falsely elevated in patients with DM and medial calcinosis.  Assessment:     [id number]   1. Diastolic dysfunction without heart failure  I51.89 EKG 12-Lead    2. Essential hypertension  Z79 Basic metabolic panel    3. History of non-ST elevation myocardial infarction (NSTEMI)  I25.2 Lipid Panel With LDL/HDL Ratio    Basic metabolic panel     Meds ordered this encounter  Medications   isosorbide mononitrate (IMDUR) 60 MG 24 hr tablet    Sig: Take 1 tablet (60 mg total) by mouth daily.    Dispense:  30 tablet    Refill:  3   Medications Discontinued During This Encounter  Medication Reason   isosorbide mononitrate (IMDUR) 30 MG 24 hr tablet     Recommendations:   Coronary artery disease, history of NSTEMI 08/2020: Reviewed results of left heart catheterization coronary angiography, details above Status post PCI to OM 2.  Continue dual antiplatelet therapy, aspirin and Plavix Continue carvedilol Continue atorvastatin Patient has had no recurrence of anginal symptoms.  Diastolic dysfunction: Reviewed and discussed with patient regarding echocardiogram and coronary angiography, details above Patient is feeling well overall and without clinical evidence of acute heart failure. Her care is being challenging due to multiple medication allergies/intolerances. Valsartan: Swelling. Spironolactone: Hyperkalemia. Hydralazine: Patient does not recall. Blood pressure control has improved since recent hospitalization.  However it remains elevated above goal. Increase Imdur from 30 mg to 60 mg daily Encourage her to use a  CPAP machine when she has it as it will help her cardiovascular outcomes as well as blood pressure Given her underlying diastolic dysfunction recommended and stressed the importance of improving her modifiable cardiovascular risk factors such as diabetes, blood pressure management, obesity.  Insulin-dependent diabetes mellitus type 2: Currently managed by her PCP.  Hyperlipidemia: Currently on statin therapy. We will repeat lipid profile testing, LDL goal <70 Could consider addition of Zetia in the future if necessary.  Benign essential hypertension: Uncontrolled. Continue amlodipine, carvedilol, clonidine Increase isosorbide mononitrate from 30 mg to 60 mg daily Patient will monitor blood pressure at home and bring a written log to her next office visit. If blood pressure remains uncontrolled can consider increasing amlodipine from 5 to 10 mg daily. She is tolerating amlodipine and Imdur without issue, despite history of side effects.  Obesity, due to excess calories: Body mass index is 39.06 kg/m. Reiterated the importance of diet and lifestyle modifications, particularly regarding weight loss and low-sodium diet.   Orders Placed This Encounter  Procedures   Lipid Panel With LDL/HDL Ratio   Basic metabolic panel   EKG 15-AVWP   --Continue cardiac medications as reconciled in final medication list. --Return in about 6 weeks (around 10/25/2020) for HTN, . Or sooner if needed. --  Continue follow-up with your primary care physician regarding the management of your other chronic comorbid conditions.  Patient's questions and concerns were addressed to her satisfaction. She voices understanding of the instructions provided during this encounter.   This note was created using a voice recognition software as a result there may be grammatical errors inadvertently enclosed that do not reflect the nature of this encounter. Every attempt is made to correct such errors.   Alethia Berthold, PA-C 09/13/2020, 4:57 PM Office: [phone]

## 2020-10-02 DIAGNOSIS — G4733 Obstructive sleep apnea (adult) (pediatric): Secondary | ICD-10-CM | POA: Diagnosis not present

## 2020-10-11 ENCOUNTER — Telehealth (HOSPITAL_COMMUNITY): Payer: Self-pay

## 2020-10-11 NOTE — Telephone Encounter (Signed)
Called patient to see if she was interested in participating in the Cardiac Rehab Program. Patient stated yes. Patient will come in for orientation on 11/01/20 @ 2:15PM and will attend the 3:15PM exercise class.   Tourist information centre manager.

## 2020-10-14 ENCOUNTER — Other Ambulatory Visit: Payer: Self-pay | Admitting: Cardiology

## 2020-10-22 ENCOUNTER — Other Ambulatory Visit: Payer: Self-pay

## 2020-10-22 DIAGNOSIS — Z006 Encounter for examination for normal comparison and control in clinical research program: Secondary | ICD-10-CM

## 2020-10-22 NOTE — Research (Signed)
Subject # [id number] [employer] Lp(a) [id number] Site # [phone]  SEX []    Female                      [x]    Female  Ethnicity []   Hispanic or Latino   [x]   Not Hispanic or Latino  Race []   White                 [x]   Black or African American  []   Asian []   American [place] or [place] Native            []   Native Hawaiian or Other Pacific Islander                      []   Other  Other   Age 50  Subject Group [x]    Local Lab           []   Historical Lp(a) value                                       Results:  Future research [x]    Yes                    []   No    [employer] [id number] Site # [id number] Subject ID # [id number]         ELIGIBILITY CRITERIA WORKSHEET INCLUSION CRITERIA   Subject has provided informed consent prior to the initiation of any study specific activities/procedures [x]   Age 24 to 69 years [x]   MI (presumed type 1) OR [x]   PCI (with high-risk features) with at least 1 of the following: []   Age >34 []   Diabetes mellitus  HbA1c: []   History of ischemic stroke []   History of peripheral arterial disease []   Residual stenosis ? 50% []   Multivessel PCI (ie, ? 2 vessels, including branch arteries []   EXCLUSIONS THE FOLLOWING N/A [x]   Subjects known to be currently receiving investigational drug in a clinical study that is anticipated to last > 1 year []   Known Lp(a) value 90mg /dL or < 200nmol/L []   Subject has a diagnosis of end-stage renal disease or requires dialysis. []   Poorly controlled (glycated hemoglobin [HbA1c] > 10%) diabetes mellitus (type 1 or type 2) []   Subject is receiving or has received lipoprotein apheresis to reduce Lp(a) within 3 months prior to enrollment. []   Known uncontrolled or recurrent ventricular tachycardia in the past 3 months prior to enrollment. []   Known malignancy (except non-melanoma skin cancers, cervical in situ carcinoma, breast ductal carcinoma in situ, or stage 1 prostate carcinoma) within the last 5 years prior to enrollment. []   Known  history or evidence of clinically significant disease (eg, respiratory, gastrointestinal, or psychiatric disease) or unstable disorder or biomarker that, in the opinion of the investigator(s), would result in life expectancy < 5 years. []   Known hemorrhagic stroke. []    [employer] Lp(a) Informed Consent   Subject Name: Tina [name]  Subject met inclusion and exclusion criteria.  The informed consent form, study requirements and expectations were reviewed with the subject and questions and concerns were addressed prior to the signing of the consent form.  The subject verbalized understanding of the trial requirements.  The subject agreed to participate in the [facility] Lp(a) trial and signed the informed consent at 1212 on 10/22/20.  The informed consent was obtained prior to  performance of any protocol-specific procedures for the subject.  A copy of the signed informed consent was given to the subject and a copy was placed in the subject's medical record.   [name]  [employer] Consent Version 2 Protocol Version 2

## 2020-10-23 LAB — LIPOPROTEIN A (LPA): Lipoprotein (a): 216.9 nmol/L — ABNORMAL HIGH (ref <75.0)

## 2020-10-25 ENCOUNTER — Other Ambulatory Visit: Payer: Self-pay

## 2020-10-25 ENCOUNTER — Ambulatory Visit (INDEPENDENT_AMBULATORY_CARE_PROVIDER_SITE_OTHER): Payer: Federal, State, Local not specified - PPO | Admitting: Ophthalmology

## 2020-10-25 ENCOUNTER — Encounter (INDEPENDENT_AMBULATORY_CARE_PROVIDER_SITE_OTHER): Payer: Self-pay | Admitting: Ophthalmology

## 2020-10-25 DIAGNOSIS — E113552 Type 2 diabetes mellitus with stable proliferative diabetic retinopathy, left eye: Secondary | ICD-10-CM

## 2020-10-25 DIAGNOSIS — E113511 Type 2 diabetes mellitus with proliferative diabetic retinopathy with macular edema, right eye: Secondary | ICD-10-CM

## 2020-10-25 DIAGNOSIS — G4733 Obstructive sleep apnea (adult) (pediatric): Secondary | ICD-10-CM

## 2020-10-25 NOTE — Assessment & Plan Note (Signed)
OS stable PDR quiescent no therapy required

## 2020-10-25 NOTE — Assessment & Plan Note (Signed)
Patient continues on BiPAP

## 2020-10-25 NOTE — Progress Notes (Signed)
10/25/2020     CHIEF COMPLAINT Patient presents for  Chief Complaint  Patient presents with   Retina Follow Up      HISTORY OF PRESENT ILLNESS: Tina [name] is a 50 y.o. female who presents to the clinic today for:   HPI     Retina Follow Up   Patient presents with  Diabetic Retinopathy.  In both eyes.  This started 8 months ago.  Severity is mild.  Duration of 8 months.  Since onset it is rapidly worsening.        Comments   8 week fu OU OCT. Pt allergic to IODINE/ contrast media iodinated diagnostic agents. Pt states "my vision has been a lot more blurry, for about a month. They were able to get my blood pressure down. I had a heart attack so I was in the hospital which is when I noticed my vision was more blurred. The heart attack happened in the beginning of September. I was walking around for two day with it before going to the hospital. I have a lot of stress with my fur baby passing and my brother in law passing too." Pt states "when I go to look at things I used to see on the t.v. I have to get closer to see it."  LBS: 119, this morning. A1C: pt not sure, pt states "I get that done at the end of the month. It was like an 8 something I think."      Last edited by Laurin Coder on 10/25/2020 10:05 AM.      Referring physician: [name], DO [address] [place],  [place] [address]  HISTORICAL INFORMATION:   Selected notes from the MEDICAL RECORD NUMBER    Lab Results  Component Value Date   HGBA1C 8.4 (H) 08/13/2020     CURRENT MEDICATIONS: No current outpatient medications on file. (Ophthalmic Drugs)   No current facility-administered medications for this visit. (Ophthalmic Drugs)   Current Outpatient Medications (Other)  Medication Sig   albuterol (VENTOLIN HFA) 108 (90 Base) MCG/ACT inhaler Inhale 2 puffs into the lungs every 4 (four) hours as needed for wheezing or shortness of breath.   allopurinol (ZYLOPRIM) 100 MG  tablet Take 100 mg by mouth at bedtime.   amLODipine (NORVASC) 5 MG tablet Take 1 tablet (5 mg total) by mouth daily.   aspirin EC 81 MG tablet Take 81 mg by mouth at bedtime. Swallow whole.   atorvastatin (LIPITOR) 40 MG tablet Take 1 tablet (40 mg total) by mouth at bedtime.   carvedilol (COREG) 25 MG tablet Take 25 mg by mouth 2 (two) times daily.   cetirizine (ZYRTEC) 10 MG tablet Take 10 mg by mouth daily.   cloNIDine (CATAPRES) 0.2 MG tablet Take 0.4 mg by mouth 2 (two) times daily.   clopidogrel (PLAVIX) 75 MG tablet Take 1 tablet by mouth once daily   Continuous Blood Gluc Receiver (FREESTYLE LIBRE 2 READER) DEVI daily. as directed   Continuous Blood Gluc Sensor (FREESTYLE LIBRE 2 SENSOR) MISC Inject 1 Device into the skin every 14 (fourteen) days.   DULoxetine (CYMBALTA) 20 MG capsule Take 20-40 mg by mouth See admin instructions. Take 20 mg by mouth in the morning and 40 mg at bedtime   EPINEPHrine (AUVI-Q) 0.3 mg/0.3 mL IJ SOAJ injection Inject 0.3 mg into the muscle as needed for anaphylaxis. (Patient taking differently: Inject 0.3 mg into the muscle once as needed for anaphylaxis.)   famotidine (  PEPCID) 20 MG tablet Take 1 tablet (20 mg total) by mouth 2 (two) times daily.   furosemide (LASIX) 40 MG tablet Take 40 mg by mouth in the morning.   gabapentin (NEURONTIN) 300 MG capsule TAKE 2 CAPSULES BY MOUTH THREE TIMES DAILY (Patient taking differently: Take 600 mg by mouth at bedtime.)   insulin aspart (NOVOLOG) 100 UNIT/ML injection Inject 10-20 Units into the skin See admin instructions. Inject 10-20 units into the skin three times a day with meals, PER SLIDING SCALE   Insulin Glargine (BASAGLAR KWIKPEN) 100 UNIT/ML Inject 22 Units into the skin at bedtime. (Patient taking differently: Inject 18 Units into the skin at bedtime.)   isosorbide mononitrate (IMDUR) 60 MG 24 hr tablet Take 1 tablet (60 mg total) by mouth daily.   metoCLOPramide (REGLAN) 10 MG tablet Take 1 tablet (10 mg  total) by mouth every 8 (eight) hours as needed for up to 5 days for nausea.   omeprazole (PRILOSEC) 40 MG capsule Take 1 capsule by mouth once daily (Patient taking differently: Take 40 mg by mouth daily.)   Semaglutide, 1 MG/DOSE, (OZEMPIC, 1 MG/DOSE,) 2 MG/1.5ML SOPN Inject 1 mg into the skin once a week.   tiZANidine (ZANAFLEX) 2 MG tablet Take 1 tablet (2 mg total) by mouth at bedtime.   traMADol (ULTRAM) 50 MG tablet Take 1 tablet by mouth twice daily (Patient taking differently: Take 50 mg by mouth daily as needed for moderate pain.)   Vitamin D, Ergocalciferol, (DRISDOL) 1.25 MG (50000 UNIT) CAPS capsule Take 1 capsule (50,000 Units total) by mouth every 7 (seven) days.   No current facility-administered medications for this visit. (Other)      REVIEW OF SYSTEMS:    ALLERGIES Allergies  Allergen Reactions   Amlodipine Benzoate Swelling and Other (See Comments)    Leg swelling   Contrast Media [Iodinated Diagnostic Agents] Shortness Of Breath and Other (See Comments)    Difficulty breathing   Iodine Anaphylaxis   Iohexol Hives, Nausea And Vomiting, Swelling and Other (See Comments)     Desc: Magnevist-gadolinium-difficulty breathing, throat swelling    Midazolam Hcl Anaphylaxis    Difficulty breathing   Other Shortness Of Breath, Itching and Other (See Comments)    Patient is allergic to all nuts except peanuts, which are NOT "nuts"- they are legumes   Shellfish Allergy Anaphylaxis   Shellfish-Derived Products Anaphylaxis   Valsartan Swelling   Metformin And Related Diarrhea, Nausea And Vomiting and Other (See Comments)    Dehydration, also   [place] [Dapagliflozin] Nausea Only and Other (See Comments)    UTI, headaches and muscle aches, also   Hydralazine Other (See Comments)    Reaction not recalled   Saxagliptin Hives, Nausea And Vomiting and Other (See Comments)    ONGLYZA- dehydration, also   Spironolactone Other (See Comments)    Elevated potassium- Severe  hyperkalemia   Avandia [Rosiglitazone Maleate] Hives and Other (See Comments)   Geodon [Ziprasidone] Other (See Comments)    Reaction not recalled   Kiwi Extract Itching and Swelling   Latex Itching    PAST MEDICAL HISTORY Past Medical History:  Diagnosis Date   Allergy    Anemia    DURING MENSES--HAS HEAVY BLEEDING WITH PERODS   Angioedema 08/13/2020   Anxiety    Back pain, chronic    "ongoing"   Blood transfusion    IN 2012  AFTER C -SECTION   Cerebral thrombosis with cerebral infarction [facility]) JUNE 2011   RIGHT SIDED WEAKNESS (  ARM AND LEG ) AND SPASMS-remains with slight weakness and vertigo.   Constipation    Depression    Diabetes mellitus    Diabetic neuropathy (HCC)    BOTH FEET --COMES AND GOES   Edema, lower extremity    Endometriosis    Fatty liver    GERD (gastroesophageal reflux disease)    with pregnancy   H/O eye surgery    Headache(784.0)    MIGRAINES--NOT REALLY HEADACHE-MORE LIKE PRESSURE SENSATION IN HEAD-FEELS DIZZIY AND  FAINT AS THE PRESSURE RESOLVES   Hernia, incisional, RLQ, s/p lap repair Sep 2013 09/02/2011   History of vertigo 03/22/2018   Hx of migraines 10/19/2011   Hypertension    Leg pain, right    "like bad Charley horse"   Multiple food allergies    Panic disorder without agoraphobia    Rash    HANDS, ARMS --STATES HX OF RASH EVER SINCE CHILDBIRTH/PREGNANCY.  STATES THE RASH OFTEN OCCURS WHEN SHE IS REALLY STRESSED."goes and comes-presently left ring finger"   Restless leg syndrome    DIAGNOSED BY SLEEP STUDY - PT TOLD SHE DID NOT HAVE SLEEP APNEA   Right rotator cuff tear    PAIN IN RIGHT SHOULDER   SBO (small bowel obstruction) ([place]) 06/03/2012   Shortness of breath    Spastic hemiplegia affecting dominant side (HCC)    Stomach problems    Stroke (HCC)    Ventral hernia    RIGHT LOWER QUADRANT-CAUSING SOME PAIN   Weakness of right side of body    Past Surgical History:  Procedure Laterality Date   APPLICATION OF WOUND VAC N/A  05/25/2015   Procedure: APPLICATION OF WOUND VAC;  Surgeon: Michael Boston, MD;  Location: [facility];  Service: General;  Laterality: N/A;   CESAREAN SECTION  2012   COLONOSCOPY     CORONARY STENT INTERVENTION N/A 09/05/2020   Procedure: CORONARY STENT INTERVENTION;  Surgeon: Nigel Mormon, MD;  Location: [place] [facility];  Service: Cardiovascular;  Laterality: N/A;   DIAGNOSTIC LAPAROSCOPY     ESOPHAGOGASTRODUODENOSCOPY N/A 06/03/2012   Procedure: ESOPHAGOGASTRODUODENOSCOPY (EGD);  Surgeon: [name] Craver, MD;  Location: [facility] ENDOSCOPY;  Service: Endoscopy;  Laterality: N/A;   EXCISION MASS ABDOMINAL N/A 05/25/2015   Procedure: ABDOMINAL WALL EXPLORATION EXCISION OF SEROMA REMOVAL OF REDUNDANT SKIN ;  Surgeon: Michael Boston, MD;  Location: [facility];  Service: General;  Laterality: N/A;   EYE SURGERY     Eye laser for vessel hemorrhaging   fybroid removal     HERNIA REPAIR  10/03/11   ventral hernia repair   INSERTION OF MESH N/A 02/04/2013   Procedure: INSERTION OF MESH;  Surgeon: Adin Hector, MD;  Location: [facility];  Service: General;  Laterality: N/A;   INTRAVASCULAR ULTRASOUND/IVUS N/A 09/05/2020   Procedure: Intravascular Ultrasound/IVUS;  Surgeon: Nigel Mormon, MD;  Location: [place] [facility];  Service: Cardiovascular;  Laterality: N/A;   LEFT HEART CATH AND CORONARY ANGIOGRAPHY N/A 09/05/2020   Procedure: LEFT HEART CATH AND CORONARY ANGIOGRAPHY;  Surgeon: Nigel Mormon, MD;  Location: [place] [facility];  Service: Cardiovascular;  Laterality: N/A;   UMBILICAL HERNIA REPAIR N/A 02/04/2013   Procedure: LAPAROSCOPIC ventral wall hernia repair LAPAROSCOPIC LYSIS OF ADHESIONS laparoscopic exploration of abdomen ;  Surgeon: Adin Hector, MD;  Location: [facility];  Service: General;  Laterality: N/A;   UPPER GASTROINTESTINAL ENDOSCOPY     URETER REVISION     Bilateral "twisted"   UTERINE FIBROID SURGERY  2 SURGERIES FOR FIBROIDS   VENTRAL HERNIA REPAIR  10/03/2011   Procedure:  LAPAROSCOPIC VENTRAL HERNIA;  Surgeon: Adin Hector, MD;  Location: [facility];  Service: General;  Laterality: N/A;    FAMILY HISTORY Family History  Problem Relation Age of Onset   Diabetes Father    Kidney disease Father    Depression Father    Drug abuse Father    Allergic rhinitis Mother    Eczema Mother    Urticaria Mother    Depression Mother    Anxiety disorder Mother    Bipolar disorder Mother    Alcoholism Mother    Drug abuse Mother    Eating disorder Mother    Diabetes Maternal Grandmother    Hyperlipidemia Paternal Grandmother    Stroke Paternal Grandmother    Eczema Sister    Urticaria Sister    Colon cancer Paternal Uncle    Other Neg Hx    Angioedema Neg Hx    Asthma Neg Hx    Colon polyps Neg Hx    Esophageal cancer Neg Hx    Rectal cancer Neg Hx    Stomach cancer Neg Hx     SOCIAL HISTORY Social History   Tobacco Use   Smoking status: Never   Smokeless tobacco: Never  Vaping Use   Vaping Use: Never used  Substance Use Topics   Alcohol use: No    Alcohol/week: 0.0 standard drinks   Drug use: No         OPHTHALMIC EXAM:  Base Eye Exam     Visual Acuity (ETDRS)       Right Left   Dist [place] 20/25 20/20 -1   [place] J10-1 J8         Tonometry (Tonopen, 10:09 AM)       Right Left   Pressure 17 15         Pupils       Pupils Dark Light Shape React APD   Right PERRL 4 3 Round Brisk None   Left PERRL 4 3 Round Brisk None         Visual Fields (Counting fingers)       Left Right    Full Full         Extraocular Movement       Right Left    Full Full         Neuro/Psych     Oriented x3: Yes   Mood/Affect: Normal         Dilation     Both eyes: 1.0% Mydriacyl, 2.5% Phenylephrine @ 10:09 AM           Slit Lamp and Fundus Exam     External Exam       Right Left   External Normal Normal         Slit Lamp Exam       Right Left   Lids/Lashes Normal Normal   Conjunctiva/Sclera White and quiet  White and quiet   Cornea Clear Clear   Anterior Chamber Deep and quiet Deep and quiet   Iris Round and reactive Round and reactive   Lens Centered Posterior chamber intraocular lens, 2+ Posterior capsular opacification Posterior chamber intraocular lens   Anterior Vitreous Normal Normal         Fundus Exam       Right Left   Posterior Vitreous Vitrectomized Vitrectomized   Disc Normal Normal   C/D Ratio 0.2 0.1   Macula Microaneurysms,  no exudates, no macular thickening clinical, Moderate clinically significant macular edema no exudates, no macular thickening, no clinically significant macular edema, Microaneurysms   Vessels PDR-quiet PDR-quiet   Periphery Good PRP, a attached Good PRP, a attached            IMAGING AND PROCEDURES  Imaging and Procedures for 10/25/20  OCT, Retina - OU - Both Eyes       Right Eye Quality was good. Scan locations included subfoveal. Central Foveal Thickness: 330. Progression has been stable. Findings include cystoid macular edema, abnormal foveal contour.   Left Eye Quality was good. Scan locations included subfoveal. Central Foveal Thickness: 351. Progression has been stable. Findings include normal foveal contour.   Notes Temporal thickening   OD, temporal CSME present needs focal               ASSESSMENT/PLAN:  Stable treated proliferative diabetic retinopathy of left eye determined by examination associated with type 2 diabetes mellitus ([place]) OS stable PDR quiescent no therapy required  Diabetic macular edema of right eye with proliferative retinopathy associated with type 2 diabetes mellitus ([place]) OD with minor temporal CSME center and threatening, will offer limited focal laser treatment to this area within 1 week.  OSA (obstructive sleep apnea) Patient continues on BiPAP      ICD-10-CM   1. Diabetic macular edema of right eye with proliferative retinopathy associated with type 2 diabetes mellitus (HCC)  E11.3511  OCT, Retina - OU - Both Eyes    2. Stable treated proliferative diabetic retinopathy of left eye determined by examination associated with type 2 diabetes mellitus ([place])  O27.7412     3. OSA (obstructive sleep apnea)  G47.33       1.  OD with minor focal CSME temporally.  In order to prevent center involvement of CSME will deliver limited focal laser treatment there in the very near future  2.  3.  Ophthalmic Meds Ordered this visit:  No orders of the defined types were placed in this encounter.      Return for OCT, dilate, FOCAL, OD.  There are no Patient Instructions on file for this visit.   Explained the diagnoses, plan, and follow up with the patient and they expressed understanding.  Patient expressed understanding of the importance of proper follow up care.   Clent Demark Waylan Busta M.D. Diseases & Surgery of the Retina and Vitreous Retina & Diabetic [place] 10/25/20     Abbreviations: M myopia (nearsighted); A astigmatism; H hyperopia (farsighted); P presbyopia; Mrx spectacle prescription;  CTL contact lenses; OD right eye; OS left eye; OU both eyes  XT exotropia; ET esotropia; PEK punctate epithelial keratitis; PEE punctate epithelial erosions; DES dry eye syndrome; MGD meibomian gland dysfunction; ATs artificial tears; PFAT's preservative free artificial tears; [place] nuclear sclerotic cataract; PSC posterior subcapsular cataract; ERM epi-retinal membrane; PVD posterior vitreous detachment; RD retinal detachment; DM diabetes mellitus; DR diabetic retinopathy; NPDR non-proliferative diabetic retinopathy; PDR proliferative diabetic retinopathy; CSME clinically significant macular edema; DME diabetic macular edema; dbh dot blot hemorrhages; CWS cotton wool spot; POAG primary open angle glaucoma; C/D cup-to-disc ratio; HVF humphrey visual field; GVF goldmann visual field; OCT optical coherence tomography; IOP intraocular pressure; BRVO Branch retinal vein occlusion; CRVO central  retinal vein occlusion; CRAO central retinal artery occlusion; BRAO branch retinal artery occlusion; RT retinal tear; SB scleral buckle; PPV pars plana vitrectomy; VH Vitreous hemorrhage; PRP panretinal laser photocoagulation; IVK intravitreal kenalog; VMT vitreomacular traction; MH Macular hole;  NVD neovascularization  of the disc; NVE neovascularization elsewhere; AREDS age related eye disease study; ARMD age related macular degeneration; POAG primary open angle glaucoma; EBMD epithelial/anterior basement membrane dystrophy; ACIOL anterior chamber intraocular lens; IOL intraocular lens; PCIOL posterior chamber intraocular lens; Phaco/IOL phacoemulsification with intraocular lens placement; [place] photorefractive keratectomy; LASIK laser assisted in situ keratomileusis; HTN hypertension; DM diabetes mellitus; COPD chronic obstructive pulmonary disease

## 2020-10-25 NOTE — Assessment & Plan Note (Signed)
OD with minor temporal CSME center and threatening, will offer limited focal laser treatment to this area within 1 week.

## 2020-10-26 ENCOUNTER — Ambulatory Visit: Payer: Federal, State, Local not specified - PPO | Admitting: Cardiology

## 2020-10-30 ENCOUNTER — Telehealth (HOSPITAL_COMMUNITY): Payer: Self-pay | Admitting: *Deleted

## 2020-10-30 ENCOUNTER — Other Ambulatory Visit: Payer: Self-pay

## 2020-10-30 ENCOUNTER — Encounter
Payer: Federal, State, Local not specified - PPO | Attending: Physical Medicine & Rehabilitation | Admitting: Physical Medicine & Rehabilitation

## 2020-10-30 ENCOUNTER — Encounter: Payer: Self-pay | Admitting: Physical Medicine & Rehabilitation

## 2020-10-30 VITALS — BP 145/86 | HR 86 | Temp 98.0°F | Ht 66.0 in | Wt 252.0 lb

## 2020-10-30 DIAGNOSIS — I69351 Hemiplegia and hemiparesis following cerebral infarction affecting right dominant side: Secondary | ICD-10-CM

## 2020-10-30 DIAGNOSIS — M533 Sacrococcygeal disorders, not elsewhere classified: Secondary | ICD-10-CM

## 2020-10-30 NOTE — Progress Notes (Signed)
Subjective:    Patient ID: Tina [name], female    DOB: [birth year], 50 y.o.   MRN: [id number]  HPI 50 year old female with history of left pontine infarct and chronic right hemiparesis.  She is at a modified independent functional level but has had some residual mild weakness and spasticity on the right side as well as neurologic gait disorder. MI diagnosed in August atypical Right sided chest and shoulder pain, Left marginal branch stent placed   Now feels weak, still independent with mobility and ADLs, cardiology ordered cardiac rehab and she will be starting orientation for that on Thursday of this week.  RIght LE spasms relieved by Tizanidine  Tries to take less tramadol but RIght shoulder , Hip , Back and leg pain worse since MI   Pain Inventory Average Pain 7 Pain Right Now 5 My pain is sharp, burning, stabbing, and aching  In the last 24 hours, has pain interfered with the following? General activity 7 Relation with others 5 Enjoyment of life 5 What TIME of day is your pain at its worst? evening and night Sleep (in general) Fair  Pain is worse with: walking, bending, and standing Pain improves with: rest, heat/ice, and pacing activities Relief from Meds: 0  Family History  Problem Relation Age of Onset   Diabetes Father    Kidney disease Father    Depression Father    Drug abuse Father    Allergic rhinitis Mother    Eczema Mother    Urticaria Mother    Depression Mother    Anxiety disorder Mother    Bipolar disorder Mother    Alcoholism Mother    Drug abuse Mother    Eating disorder Mother    Diabetes Maternal Grandmother    Hyperlipidemia Paternal Grandmother    Stroke Paternal Grandmother    Eczema Sister    Urticaria Sister    Colon cancer Paternal Uncle    Other Neg Hx    Angioedema Neg Hx    Asthma Neg Hx    Colon polyps Neg Hx    Esophageal cancer Neg Hx    Rectal cancer Neg Hx    Stomach cancer Neg Hx    Social History   Socioeconomic  History   Marital status: Married    Spouse name: Annie Main   Number of children: 3   Years of education: BA degree   Highest education level: Not on file  Occupational History   Occupation: stay at home mom    Employer: UNEMPLOYED  Tobacco Use   Smoking status: Never   Smokeless tobacco: Never  Vaping Use   Vaping Use: Never used  Substance and Sexual Activity   Alcohol use: No    Alcohol/week: 0.0 standard drinks   Drug use: No   Sexual activity: Yes    Birth control/protection: None  Other Topics Concern   Not on file  Social History Narrative   Patient is married with 2 children.   Patient is right handed.   Patient has her  BA degree.   Patient drinks 1 cup daily.   Social Determinants of Health   Financial Resource Strain: Not on file  Food Insecurity: Not on file  Transportation Needs: Not on file  Physical Activity: Not on file  Stress: Not on file  Social Connections: Not on file   Past Surgical History:  Procedure Laterality Date   APPLICATION OF WOUND VAC N/A 05/25/2015   Procedure: APPLICATION OF WOUND VAC;  Surgeon: Michael Boston,  MD;  Location: [facility];  Service: General;  Laterality: N/A;   CESAREAN SECTION  2012   COLONOSCOPY     CORONARY STENT INTERVENTION N/A 09/05/2020   Procedure: CORONARY STENT INTERVENTION;  Surgeon: Nigel Mormon, MD;  Location: [place] [facility];  Service: Cardiovascular;  Laterality: N/A;   DIAGNOSTIC LAPAROSCOPY     ESOPHAGOGASTRODUODENOSCOPY N/A 06/03/2012   Procedure: ESOPHAGOGASTRODUODENOSCOPY (EGD);  Surgeon: [name] Craver, MD;  Location: [facility];  Service: Endoscopy;  Laterality: N/A;   EXCISION MASS ABDOMINAL N/A 05/25/2015   Procedure: ABDOMINAL WALL EXPLORATION EXCISION OF SEROMA REMOVAL OF REDUNDANT SKIN ;  Surgeon: Michael Boston, MD;  Location: [facility];  Service: General;  Laterality: N/A;   EYE SURGERY     Eye laser for vessel hemorrhaging   fybroid removal     HERNIA REPAIR  10/03/11   ventral hernia repair    INSERTION OF MESH N/A 02/04/2013   Procedure: INSERTION OF MESH;  Surgeon: Adin Hector, MD;  Location: [facility];  Service: General;  Laterality: N/A;   INTRAVASCULAR ULTRASOUND/IVUS N/A 09/05/2020   Procedure: Intravascular Ultrasound/IVUS;  Surgeon: Nigel Mormon, MD;  Location: [place] [facility];  Service: Cardiovascular;  Laterality: N/A;   LEFT HEART CATH AND CORONARY ANGIOGRAPHY N/A 09/05/2020   Procedure: LEFT HEART CATH AND CORONARY ANGIOGRAPHY;  Surgeon: Nigel Mormon, MD;  Location: [place] [facility];  Service: Cardiovascular;  Laterality: N/A;   UMBILICAL HERNIA REPAIR N/A 02/04/2013   Procedure: LAPAROSCOPIC ventral wall hernia repair LAPAROSCOPIC LYSIS OF ADHESIONS laparoscopic exploration of abdomen ;  Surgeon: Adin Hector, MD;  Location: [facility];  Service: General;  Laterality: N/A;   UPPER GASTROINTESTINAL ENDOSCOPY     URETER REVISION     Bilateral "twisted"   UTERINE FIBROID SURGERY     2 SURGERIES FOR FIBROIDS   VENTRAL HERNIA REPAIR  10/03/2011   Procedure: LAPAROSCOPIC VENTRAL HERNIA;  Surgeon: Adin Hector, MD;  Location: [facility];  Service: General;  Laterality: N/A;   Past Surgical History:  Procedure Laterality Date   APPLICATION OF WOUND VAC N/A 05/25/2015   Procedure: APPLICATION OF WOUND VAC;  Surgeon: Michael Boston, MD;  Location: [facility];  Service: General;  Laterality: N/A;   CESAREAN SECTION  2012   COLONOSCOPY     CORONARY STENT INTERVENTION N/A 09/05/2020   Procedure: CORONARY STENT INTERVENTION;  Surgeon: Nigel Mormon, MD;  Location: [place] [facility];  Service: Cardiovascular;  Laterality: N/A;   DIAGNOSTIC LAPAROSCOPY     ESOPHAGOGASTRODUODENOSCOPY N/A 06/03/2012   Procedure: ESOPHAGOGASTRODUODENOSCOPY (EGD);  Surgeon: [name] Craver, MD;  Location: [facility];  Service: Endoscopy;  Laterality: N/A;   EXCISION MASS ABDOMINAL N/A 05/25/2015   Procedure: ABDOMINAL WALL EXPLORATION EXCISION OF SEROMA REMOVAL OF REDUNDANT SKIN ;   Surgeon: Michael Boston, MD;  Location: [facility];  Service: General;  Laterality: N/A;   EYE SURGERY     Eye laser for vessel hemorrhaging   fybroid removal     HERNIA REPAIR  10/03/11   ventral hernia repair   INSERTION OF MESH N/A 02/04/2013   Procedure: INSERTION OF MESH;  Surgeon: Adin Hector, MD;  Location: [facility];  Service: General;  Laterality: N/A;   INTRAVASCULAR ULTRASOUND/IVUS N/A 09/05/2020   Procedure: Intravascular Ultrasound/IVUS;  Surgeon: Nigel Mormon, MD;  Location: [place] [facility];  Service: Cardiovascular;  Laterality: N/A;   LEFT HEART CATH AND CORONARY ANGIOGRAPHY N/A 09/05/2020   Procedure: LEFT HEART CATH AND CORONARY  ANGIOGRAPHY;  Surgeon: Nigel Mormon, MD;  Location: [place] [facility];  Service: Cardiovascular;  Laterality: N/A;   UMBILICAL HERNIA REPAIR N/A 02/04/2013   Procedure: LAPAROSCOPIC ventral wall hernia repair LAPAROSCOPIC LYSIS OF ADHESIONS laparoscopic exploration of abdomen ;  Surgeon: Adin Hector, MD;  Location: [facility];  Service: General;  Laterality: N/A;   UPPER GASTROINTESTINAL ENDOSCOPY     URETER REVISION     Bilateral "twisted"   UTERINE FIBROID SURGERY     2 SURGERIES FOR FIBROIDS   VENTRAL HERNIA REPAIR  10/03/2011   Procedure: LAPAROSCOPIC VENTRAL HERNIA;  Surgeon: Adin Hector, MD;  Location: [facility];  Service: General;  Laterality: N/A;   Past Medical History:  Diagnosis Date   Allergy    Anemia    DURING MENSES--HAS HEAVY BLEEDING WITH PERODS   Angioedema 08/13/2020   Anxiety    Back pain, chronic    "ongoing"   Blood transfusion    IN 2012  AFTER C -SECTION   Cerebral thrombosis with cerebral infarction [facility]) JUNE 2011   RIGHT SIDED WEAKNESS ( ARM AND LEG ) AND SPASMS-remains with slight weakness and vertigo.   Constipation    Depression    Diabetes mellitus    Diabetic neuropathy (HCC)    BOTH FEET --COMES AND GOES   Edema, lower extremity    Endometriosis    Fatty liver    GERD (gastroesophageal  reflux disease)    with pregnancy   H/O eye surgery    Headache(784.0)    MIGRAINES--NOT REALLY HEADACHE-MORE LIKE PRESSURE SENSATION IN HEAD-FEELS DIZZIY AND  FAINT AS THE PRESSURE RESOLVES   Hernia, incisional, RLQ, s/p lap repair Sep 2013 09/02/2011   History of vertigo 03/22/2018   Hx of migraines 10/19/2011   Hypertension    Leg pain, right    "like bad Charley horse"   Multiple food allergies    Panic disorder without agoraphobia    Rash    HANDS, ARMS --STATES HX OF RASH EVER SINCE CHILDBIRTH/PREGNANCY.  STATES THE RASH OFTEN OCCURS WHEN SHE IS REALLY STRESSED."goes and comes-presently left ring finger"   Restless leg syndrome    DIAGNOSED BY SLEEP STUDY - PT TOLD SHE DID NOT HAVE SLEEP APNEA   Right rotator cuff tear    PAIN IN RIGHT SHOULDER   SBO (small bowel obstruction) ([place]) 06/03/2012   Shortness of breath    Spastic hemiplegia affecting dominant side (HCC)    Stomach problems    Stroke (HCC)    Ventral hernia    RIGHT LOWER QUADRANT-CAUSING SOME PAIN   Weakness of right side of body    BP (!) 145/86   Pulse 86   Temp 98 F (36.7 C)   Ht 5\' 6"  (1.676 m)   Wt 252 lb (114.3 kg)   SpO2 98%   BMI 40.67 kg/m   Opioid Risk Score:   Fall Risk Score:  `1  Depression screen PHQ 2/9  Depression screen [facility] 2/9 10/30/2020 07/27/2019 02/09/2019 11/18/2018 05/18/2018 05/13/2018 01/28/2018  Decreased Interest 1 0 0 0 3 1 1   Down, Depressed, Hopeless 1 1 1 1 2  0 0  PHQ - 2 Score 2 1 1 1 5 1 1   Altered sleeping - - - - 3 - -  Tired, decreased energy - - - - 3 - -  Change in appetite - - - - 3 - -  Feeling bad or failure about yourself  - - - - 3 - -  Trouble concentrating - - - - 2 - -  Moving slowly or fidgety/restless - - - - 0 - -  Suicidal thoughts - - - - 0 - -  PHQ-9 Score - - - - 19 - -  Difficult doing work/chores - - - - - - -  Some recent data might be hidden     Review of Systems  Constitutional: Negative.   HENT: Negative.    Eyes: Negative.   Respiratory:  Negative.    Cardiovascular: Negative.   Gastrointestinal: Negative.   Endocrine: Negative.   Genitourinary: Negative.   Musculoskeletal:  Positive for back pain.  Skin: Negative.   Allergic/Immunologic: Negative.   Neurological: Negative.   Hematological: Negative.   Psychiatric/Behavioral: Negative.        Objective:   Physical Exam Constitutional:      Appearance: She is obese.  HENT:     Head: Normocephalic and atraumatic.  Eyes:     Extraocular Movements: Extraocular movements intact.     Conjunctiva/sclera: Conjunctivae normal.     Pupils: Pupils are equal, round, and reactive to light.  Skin:    General: Skin is warm and dry.  Neurological:     Mental Status: She is alert and oriented to person, place, and time.  Psychiatric:        Mood and Affect: Mood normal.        Behavior: Behavior normal.   RIght side 4/5 , Left side 5/5 #3+ reflexes on RIght side Ambulates without assistive device no evidence of toe drag or knee instability.  No preferential rotation     Assessment & Plan:   1.  History of remote left pontine infarct with residual right hemiparesis.  She has some deconditioning resulting from her recent myocardial infarction.  Agree that cardiac rehab should be tried first with close monitoring.  She will return in 2 months and if she still has some residual weakness or gait imbalance would recommend some neuro rehab at that point. Will continue tramadol for pain as well as tizanidine for spasticity Cardiac rehab at [facility] starting on THursday Oct 20th

## 2020-10-30 NOTE — Telephone Encounter (Signed)
Called Tina [name] to confirm that she was coming to her CR orientation appointment and to complete a health history. She states that would be here. I completed her health history. When I questioned pt about pain, she states that she has "discomfort in her chest." As I questioned her further she said it was more like it felt that her heart stops then starts back. I ask her if this felt anything like it did when she had her heart attack and stent and she replied no. Pt states that she has felt this discomfort since the heart attack and stent. She mentioned that she is seeing Dr. Terri Skains  11/16/20 at 11 am and she was planning to talk with her then. We discussed Covid precautions, mask, proper shoes, directions to the department and our contact number. She voices understanding.

## 2020-11-01 ENCOUNTER — Other Ambulatory Visit: Payer: Self-pay

## 2020-11-01 ENCOUNTER — Telehealth: Payer: Self-pay

## 2020-11-01 ENCOUNTER — Ambulatory Visit (HOSPITAL_COMMUNITY)
Admission: RE | Admit: 2020-11-01 | Discharge: 2020-11-01 | Disposition: A | Discharge status: Home / Self Care | Payer: Federal, State, Local not specified - PPO | Source: Ambulatory Visit | Attending: Cardiology | Admitting: Cardiology

## 2020-11-01 ENCOUNTER — Encounter (HOSPITAL_COMMUNITY)
Admission: RE | Admit: 2020-11-01 | Discharge: 2020-11-01 | Disposition: A | Discharge status: Home / Self Care | Payer: Federal, State, Local not specified - PPO | Source: Ambulatory Visit | Attending: Cardiology | Admitting: Cardiology

## 2020-11-01 VITALS — BP 130/80 | HR 82 | Ht 66.75 in | Wt 256.2 lb

## 2020-11-01 DIAGNOSIS — Z955 Presence of coronary angioplasty implant and graft: Secondary | ICD-10-CM | POA: Insufficient documentation

## 2020-11-01 DIAGNOSIS — G4733 Obstructive sleep apnea (adult) (pediatric): Secondary | ICD-10-CM | POA: Diagnosis not present

## 2020-11-01 DIAGNOSIS — I214 Non-ST elevation (NSTEMI) myocardial infarction: Secondary | ICD-10-CM | POA: Insufficient documentation

## 2020-11-01 HISTORY — DX: Hyperlipidemia, unspecified: E78.5

## 2020-11-01 NOTE — Progress Notes (Signed)
Tina Tina [name] is here for cardiac rehab orientation and reports that she has had intermittent chest discomfort that feels like a pressure that comes and goes. Blood pressure 130/80 CBG 134. Oxygen saturation 99% on room air. Heart rate 78. Tina [name] denies having actual chest [name]. Tina [name] says that this sensation of intermittent discomfort has been going on ever since her stent was placed in August. Patient denies diaphoresis. Tina [name] says the discomfort is mid sternal,  non radiating. 12 lead ECG obtained. Dr Brennan Bailey office called and notified. Dr Terri Skains reviewed the 12 lead ECG ECG and spoke with Tina [name] over the phone. Dr Terri Skains said that the 12 lead ECG did not show any acute changes and that Tina [name] may proceed with 6 minute walk test, orientation and exercise. Patient instructed that if she has any actual chest [name] to report to the ED for evaluation. Patient states understanding. [name], RN,BSN 11/01/2020 3:31 PM

## 2020-11-02 ENCOUNTER — Encounter (HOSPITAL_COMMUNITY): Payer: Self-pay

## 2020-11-02 DIAGNOSIS — E1122 Type 2 diabetes mellitus with diabetic chronic kidney disease: Secondary | ICD-10-CM | POA: Diagnosis not present

## 2020-11-02 DIAGNOSIS — E1169 Type 2 diabetes mellitus with other specified complication: Secondary | ICD-10-CM | POA: Diagnosis not present

## 2020-11-02 DIAGNOSIS — E1165 Type 2 diabetes mellitus with hyperglycemia: Secondary | ICD-10-CM | POA: Diagnosis not present

## 2020-11-02 NOTE — Progress Notes (Signed)
Cardiac Rehab Medication Review by a Nurse  Does the patient  feel that his/her medications are working for him/her?  YES  Has the patient been experiencing any side effects to the medications prescribed?   NO  Does the patient measure his/her own blood pressure or blood glucose at home?  YES   Does the patient have any problems obtaining medications due to transportation or finances?    NO  Understanding of regimen: good Understanding of indications: good Potential of compliance: good    Nurse comments: Rosamund is taking her medications as prescribed and has a good understanding of her medications. Aliece has a CGM.     Christa See Jinelle Butchko RN 11/02/2020 10:00 AM

## 2020-11-02 NOTE — Telephone Encounter (Signed)
Spoke to Tina [name] on 11/01/2020. See EMR.

## 2020-11-02 NOTE — Progress Notes (Signed)
Cardiac Individual Treatment Plan  Patient Details  Name: VANYA CARBERRY MRN: [id number] Date of Birth: 18-May-1970 Referring Provider:   Flowsheet Row CARDIAC REHAB PHASE II ORIENTATION from 11/01/2020 in [facility]  Referring Provider Dr. Rex Kras, DO       Initial Encounter Date:  [place] from 11/01/2020 in [facility]  Date 11/01/20       Visit Diagnosis: 09/05/20 NSTEMI (non-ST elevated myocardial infarction) ([place])  09/05/20 S/P DES OM2  Patient's Home Medications on Admission:  Current Outpatient Medications:    albuterol (VENTOLIN HFA) 108 (90 Base) MCG/ACT inhaler, Inhale 2 puffs into the lungs every 4 (four) hours as needed for wheezing or shortness of breath., Disp: 18 g, Rfl: 1   allopurinol (ZYLOPRIM) 100 MG tablet, Take 100 mg by mouth at bedtime., Disp: , Rfl:    amLODipine (NORVASC) 5 MG tablet, Take 1 tablet (5 mg total) by mouth daily., Disp: 90 tablet, Rfl: 0   aspirin EC 81 MG tablet, Take 81 mg by mouth at bedtime. Swallow whole., Disp: , Rfl:    atorvastatin (LIPITOR) 40 MG tablet, Take 1 tablet (40 mg total) by mouth at bedtime., Disp: 90 tablet, Rfl: 0   carvedilol (COREG) 25 MG tablet, Take 25 mg by mouth 2 (two) times daily., Disp: , Rfl:    cetirizine (ZYRTEC) 10 MG tablet, Take 10 mg by mouth daily as needed for allergies (itching)., Disp: , Rfl:    cloNIDine (CATAPRES) 0.2 MG tablet, Take 0.4 mg by mouth 2 (two) times daily., Disp: , Rfl:    clopidogrel (PLAVIX) 75 MG tablet, Take 1 tablet by mouth once daily, Disp: 30 tablet, Rfl: 0   DULoxetine (CYMBALTA) 20 MG capsule, Take 20-40 mg by mouth See admin instructions. Take 20 mg by mouth in the morning and 40 mg at bedtime, Disp: , Rfl:    EPINEPHrine (AUVI-Q) 0.3 mg/0.3 mL IJ SOAJ injection, Inject 0.3 mg into the muscle as needed for anaphylaxis., Disp: 1 each, Rfl: 1   famotidine (PEPCID) 20 MG  tablet, Take 1 tablet (20 mg total) by mouth 2 (two) times daily., Disp: 60 tablet, Rfl: 5   furosemide (LASIX) 40 MG tablet, Take 40 mg by mouth in the morning., Disp: , Rfl:    Insulin Aspart FlexPen (NOVOLOG) 100 UNIT/ML, Inject 0-25 Units into the skin 3 (three) times daily before meals. Dose per sliding scale., Disp: , Rfl:    Insulin Glargine (BASAGLAR KWIKPEN) 100 UNIT/ML, Inject 22 Units into the skin at bedtime. (Patient taking differently: Inject 18 Units into the skin at bedtime.), Disp: , Rfl:    isosorbide mononitrate (IMDUR) 60 MG 24 hr tablet, Take 1 tablet (60 mg total) by mouth daily., Disp: 30 tablet, Rfl: 3   omeprazole (PRILOSEC) 40 MG capsule, Take 1 capsule by mouth once daily (Patient taking differently: Take 40 mg by mouth daily as needed (acid reflux).), Disp: 90 capsule, Rfl: 0   Semaglutide, 1 MG/DOSE, (OZEMPIC, 1 MG/DOSE,) 2 MG/1.5ML SOPN, Inject 1 mg into the skin every Friday., Disp: , Rfl:    tiZANidine (ZANAFLEX) 2 MG tablet, Take 1 tablet (2 mg total) by mouth at bedtime., Disp: 30 tablet, Rfl: 2   traMADol (ULTRAM) 50 MG tablet, Take 1 tablet by mouth twice daily (Patient taking differently: Take 50 mg by mouth at bedtime.), Disp: 60 tablet, Rfl: 4   Continuous Blood Gluc Sensor (FREESTYLE LIBRE 2 SENSOR)  MISC, Inject 1 Device into the skin every 14 (fourteen) days., Disp: , Rfl:    gabapentin (NEURONTIN) 300 MG capsule, TAKE 2 CAPSULES BY MOUTH THREE TIMES DAILY, Disp: 180 capsule, Rfl: 2   metoCLOPramide (REGLAN) 10 MG tablet, Take 1 tablet (10 mg total) by mouth every 8 (eight) hours as needed for up to 5 days for nausea., Disp: 15 tablet, Rfl: 0  Past Medical History: Past Medical History:  Diagnosis Date   Allergy    Anemia    DURING MENSES--HAS HEAVY BLEEDING WITH PERODS   Angioedema 08/13/2020   Anxiety    Back pain, chronic    "ongoing"   Blood transfusion    IN 2012  AFTER C -SECTION   Cerebral thrombosis with cerebral infarction ([facility]) 06/2009    RIGHT SIDED WEAKNESS ( ARM AND LEG ) AND SPASMS-remains with slight weakness and vertigo.   Constipation    Depression    Diabetes mellitus    Diabetic neuropathy (HCC)    BOTH FEET --COMES AND GOES   Edema, lower extremity    Endometriosis    Fatty liver    GERD (gastroesophageal reflux disease)    with pregnancy   H/O eye surgery    Headache(784.0)    MIGRAINES--NOT REALLY HEADACHE-MORE LIKE PRESSURE SENSATION IN HEAD-FEELS DIZZIY AND  FAINT AS THE PRESSURE RESOLVES   Hernia, incisional, RLQ, s/p lap repair Sep 2013 09/02/2011   History of vertigo 03/22/2018   Hx of migraines 10/19/2011   Hyperlipidemia    Hypertension    Leg pain, right    "like bad Charley horse"   Multiple food allergies    Panic disorder without agoraphobia    Rash    HANDS, ARMS --STATES HX OF RASH [place].  STATES THE RASH OFTEN OCCURS WHEN SHE IS REALLY STRESSED."goes and comes-presently left ring finger"   Restless leg syndrome    DIAGNOSED BY SLEEP STUDY - PT TOLD SHE DID NOT HAVE SLEEP APNEA   Right rotator cuff tear    PAIN IN RIGHT SHOULDER   SBO (small bowel obstruction) ([place]) 06/03/2012   Shortness of breath    Spastic hemiplegia affecting dominant side (HCC)    Stomach problems    Stroke (HCC)    Ventral hernia    RIGHT LOWER QUADRANT-CAUSING SOME PAIN   Weakness of right side of body     Tobacco Use: Social History   Tobacco Use  Smoking Status Never  Smokeless Tobacco Never    Labs: Recent Review Flowsheet Data     Labs for ITP Cardiac and Pulmonary Rehab Latest Ref Rng & Units 04/12/2019 08/02/2019 08/13/2020 09/05/2020 09/06/2020   Cholestrol 0 - 200 mg/dL - 191 - - 175   LDLCALC 0 - 99 mg/dL - 102 - - 92   HDL >40 mg/dL - 54 - - 56   Trlycerides <150 mg/dL - 207(A) - - 137   Hemoglobin A1c 4.8 - 5.6 % 10.1(H) 8.4 8.4(H) - -   PHART 7.350 - 7.450 - - - 7.384 -   PCO2ART 32.0 - 48.0 mmHg - - - 36.2 -   HCO3 20.0 - 28.0 mmol/L - - - 21.6 -   TCO2 22 - 32  mmol/L - - - 23 -   ACIDBASEDEF 0.0 - 2.0 mmol/L - - - 3.0(H) -   O2SAT % - - - 96.0 -       Capillary Blood Glucose: Lab Results  Component Value Date   GLUCAP 188 (H)  09/11/2020   GLUCAP 277 (H) 09/10/2020   GLUCAP 197 (H) 09/10/2020   GLUCAP 194 (H) 09/10/2020   GLUCAP 481 (H) 09/06/2020     Exercise Target Goals: Exercise Program Goal: Individual exercise prescription set using results from initial 6 min walk test and THRR while considering  patient's activity barriers and safety.   Exercise Prescription Goal: Starting with aerobic activity 30 plus minutes a day, 3 days per week for initial exercise prescription. Provide home exercise prescription and guidelines that participant acknowledges understanding prior to discharge.  Activity Barriers & Risk Stratification:  Activity Barriers & Cardiac Risk Stratification - 11/01/20 1640       Activity Barriers & Cardiac Risk Stratification   Activity Barriers Chest Pain/Angina;Back Problems;Deconditioning;Balance Concerns;History of Falls;Shortness of Breath    Cardiac Risk Stratification High             6 Minute Walk:  6 Minute Walk     Row Name 11/01/20 1632         6 Minute Walk   Phase Initial     Distance 1262 feet     Walk Time 6 minutes     # of Rest Breaks 0     MPH 2.39     METS 3.31     RPE 15     Perceived Dyspnea  1     VO2 Peak 11.57     Symptoms Yes (comment)     Comments headache 5/10 pain. Left side, hip, back sciatica 9/10 (both relieved with rest)     Resting HR 82 bpm     Resting BP 130/80     Resting Oxygen Saturation  99 %     Exercise Oxygen Saturation  during 6 min walk 99 %     Max Ex. HR 96 bpm     Max Ex. BP 162/90     2 Minute Post BP 144/72              Oxygen Initial Assessment:   Oxygen Re-Evaluation:   Oxygen Discharge (Final Oxygen Re-Evaluation):   Initial Exercise Prescription:  Initial Exercise Prescription - 11/01/20 1600       Date of Initial  Exercise RX and Referring Provider   Date 11/01/20    Referring Provider Dr. Rex Kras, DO    Expected Discharge Date 12/28/20      NuStep   Level 1    SPM 75    Minutes 25    METs 1.9      Prescription Details   Frequency (times per week) 3    Duration Progress to 30 minutes of continuous aerobic without signs/symptoms of physical distress      Intensity   THRR 40-80% of Max Heartrate 68-135    Ratings of Perceived Exertion 11-13    Perceived Dyspnea 0-4      Progression   Progression Continue progressive overload as per policy without signs/symptoms or physical distress.      Resistance Training   Training Prescription Yes    Weight 2    Reps 10-15             Perform Capillary Blood Glucose checks as needed.  Exercise Prescription Changes:   Exercise Comments:   Exercise Goals and Review:   Exercise Goals     Row Name 11/01/20 1643             Exercise Goals   Increase Physical Activity Yes       Intervention Provide  advice, education, support and counseling about physical activity/exercise needs.;Develop an individualized exercise prescription for aerobic and resistive training based on initial evaluation findings, risk stratification, comorbidities and participant's personal goals.       Expected Outcomes Short Term: Attend rehab on a regular basis to increase amount of physical activity.;Long Term: Add in home exercise to make exercise part of routine and to increase amount of physical activity.;Long Term: Exercising regularly at least 3-5 days a week.       Increase Strength and Stamina Yes       Intervention Provide advice, education, support and counseling about physical activity/exercise needs.;Develop an individualized exercise prescription for aerobic and resistive training based on initial evaluation findings, risk stratification, comorbidities and participant's personal goals.       Expected Outcomes Short Term: Increase workloads from initial  exercise prescription for resistance, speed, and METs.;Short Term: Perform resistance training exercises routinely during rehab and add in resistance training at home;Long Term: Improve cardiorespiratory fitness, muscular endurance and strength as measured by increased METs and functional capacity (6MWT)       Able to understand and use rate of perceived exertion (RPE) scale Yes       Intervention Provide education and explanation on how to use RPE scale       Expected Outcomes Short Term: Able to use RPE daily in rehab to express subjective intensity level;Long Term:  Able to use RPE to guide intensity level when exercising independently       Able to understand and use Dyspnea scale Yes       Intervention Provide education and explanation on how to use Dyspnea scale       Expected Outcomes Short Term: Able to use Dyspnea scale daily in rehab to express subjective sense of shortness of breath during exertion;Long Term: Able to use Dyspnea scale to guide intensity level when exercising independently       Knowledge and understanding of Target Heart Rate Range (THRR) Yes       Intervention Provide education and explanation of THRR including how the numbers were predicted and where they are located for reference       Expected Outcomes Short Term: Able to state/look up THRR;Long Term: Able to use THRR to govern intensity when exercising independently;Short Term: Able to use daily as guideline for intensity in rehab       Understanding of Exercise Prescription Yes       Intervention Provide education, explanation, and written materials on patient's individual exercise prescription       Expected Outcomes Short Term: Able to explain program exercise prescription;Long Term: Able to explain home exercise prescription to exercise independently                Exercise Goals Re-Evaluation :    Discharge Exercise Prescription (Final Exercise Prescription Changes):   Nutrition:  Target Goals:  Understanding of nutrition guidelines, daily intake of sodium 1500mg , cholesterol 200mg , calories 30% from fat and 7% or less from saturated fats, daily to have 5 or more servings of fruits and vegetables.  Biometrics:  Pre Biometrics - 11/01/20 1631       Pre Biometrics   Waist Circumference 50 inches    Hip Circumference 46 inches    Waist to Hip Ratio 1.09 %    Triceps Skinfold 52 mm    % Body Fat 52.7 %    Grip Strength 25 kg    Flexibility 10 in    Single Leg Stand 30  seconds              Nutrition Therapy Plan and Nutrition Goals:   Nutrition Assessments:  MEDIFICTS Score Key: ?70 Need to make dietary changes  40-70 Heart Healthy Diet ? 40 Therapeutic Level Cholesterol Diet   Picture Your Plate Scores: <16 Unhealthy dietary pattern with much room for improvement. 41-50 Dietary pattern unlikely to meet recommendations for good health and room for improvement. 51-60 More healthful dietary pattern, with some room for improvement.  >60 Healthy dietary pattern, although there may be some specific behaviors that could be improved.    Nutrition Goals Re-Evaluation:   Nutrition Goals Discharge (Final Nutrition Goals Re-Evaluation):   Psychosocial: Target Goals: Acknowledge presence or absence of significant depression and/or stress, maximize coping skills, provide positive support system. Participant is able to verbalize types and ability to use techniques and skills needed for reducing stress and depression.  Initial Review & Psychosocial Screening:  Initial Psych Review & Screening - 11/02/20 1038       Initial Review   Current issues with Current Depression;Current Stress Concerns;History of Depression    Source of Stress Concerns Family;Chronic Illness;Unable to participate in former interests or hobbies;Unable to perform yard/household activities    Comments Margrete is currently depressed and is taking an antidepressant. Lu's Dog passes away  recently. Jakeya is rasing 3 boys one 1 and 51 year old twins with her husband.      Family Dynamics   Good Support System? Yes   Toya has her husband and sister in law for support     Barriers   Psychosocial barriers to participate in program The patient should benefit from training in stress management and relaxation.;Psychosocial barriers identified (see note)      Screening Interventions   Interventions To provide support and resources with identified psychosocial needs;Encouraged to exercise;Provide feedback about the scores to participant    Expected Outcomes Long Term Goal: Stressors or current issues are controlled or eliminated.;Long Term goal: The participant improves quality of Life and PHQ9 Scores as seen by post scores and/or verbalization of changes;Short Term goal: Identification and review with participant of any Quality of Life or Depression concerns found by scoring the questionnaire.             Quality of Life Scores:  Quality of Life - 11/01/20 1653       Quality of Life   Select Quality of Life      Quality of Life Scores   Health/Function Pre 13.2 %    Socioeconomic Pre 24.38 %    Psych/Spiritual Pre 18 %    Family Pre 26.4 %    GLOBAL Pre 18.6 %            Scores of 19 and below usually indicate a poorer quality of life in these areas.  A difference of  2-3 points is a clinically meaningful difference.  A difference of 2-3 points in the total score of the Quality of Life Index has been associated with significant improvement in overall quality of life, self-image, physical symptoms, and general health in studies assessing change in quality of life.  PHQ-9: Recent Review Flowsheet Data     Depression screen [facility] 2/9 11/02/2020 10/30/2020 07/27/2019 02/09/2019 11/18/2018   Decreased Interest 0 1 0 0 0   Down, Depressed, Hopeless 1 1 1 1 1    PHQ - 2 Score 1 2 1 1 1       Interpretation of Total Score  Total Score  Depression Severity:  1-4 =  Minimal depression, 5-9 = Mild depression, 10-14 = Moderate depression, 15-19 = Moderately severe depression, 20-27 = Severe depression   Psychosocial Evaluation and Intervention:   Psychosocial Re-Evaluation:   Psychosocial Discharge (Final Psychosocial Re-Evaluation):   Vocational Rehabilitation: Provide vocational rehab assistance to qualifying candidates.   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            Education: Education Goals: Education classes will be provided on a weekly basis, covering required topics. Participant will state understanding/return demonstration of topics presented.  Learning Barriers/Preferences:  Learning Barriers/Preferences - 11/01/20 1654       Learning Barriers/Preferences   Learning Barriers Exercise Concerns   weakness in right leg and right hand   Learning Preferences Group Instruction;Individual Instruction;Pictoral;Skilled Demonstration;Video;Written Material             Education Topics: Hypertension, Hypertension Reduction -Define heart disease and high blood pressure. Discus how high blood pressure affects the body and ways to reduce high blood pressure.   Exercise and Your Heart -Discuss why it is important to exercise, the FITT principles of exercise, normal and abnormal responses to exercise, and how to exercise safely.   Angina -Discuss definition of angina, causes of angina, treatment of angina, and how to decrease risk of having angina.   Cardiac Medications -Review what the following cardiac medications are used for, how they affect the body, and side effects that may occur when taking the medications.  Medications include Aspirin, Beta blockers, calcium channel blockers, ACE  Inhibitors, angiotensin receptor blockers, diuretics, digoxin, and antihyperlipidemics.   Congestive Heart Failure -Discuss the definition of CHF, how to live with CHF, the signs and symptoms of CHF, and how keep track of weight and sodium intake.   Heart Disease and Intimacy -Discus the effect sexual activity has on the heart, how changes occur during intimacy as we age, and safety during sexual activity.   Smoking Cessation / COPD -Discuss different methods to quit smoking, the health benefits of quitting smoking, and the definition of COPD.   Nutrition I: Fats -Discuss the types of cholesterol, what cholesterol does to the heart, and how cholesterol levels can be controlled.   Nutrition II: Labels -Discuss the different components of food labels and how to read food label   Heart Parts/Heart Disease and PAD -Discuss the anatomy of the heart, the pathway of blood circulation through the heart, and these are affected by heart disease.   Stress I: Signs and Symptoms -Discuss the causes of stress, how stress may lead to anxiety and depression, and ways to limit stress.   Stress II: Relaxation -Discuss different types of relaxation techniques to limit stress.   Warning Signs of Stroke / TIA -Discuss definition of a stroke, what the signs and symptoms are of a stroke, and how to identify when someone is having stroke.   Knowledge Questionnaire Score:  Knowledge Questionnaire Score - 11/02/20 0833       Knowledge Questionnaire Score   Pre Score 16/24             Core Components/Risk Factors/Patient Goals at Admission:  Personal Goals and Risk Factors at Admission - 11/02/20 0833       Core Components/Risk Factors/Patient Goals on Admission  Weight Management Yes;Obesity;Weight Loss    Intervention Weight Management: Develop a combined nutrition and exercise program designed to reach desired caloric intake, while maintaining appropriate intake of nutrient and  fiber, sodium and fats, and appropriate energy expenditure required for the weight goal.;Weight Management: Provide education and appropriate resources to help participant work on and attain dietary goals.;Weight Management/Obesity: Establish reasonable short term and long term weight goals.;Obesity: Provide education and appropriate resources to help participant work on and attain dietary goals.    Admit Weight 256 lb 2.8 oz (116.2 kg)    Expected Outcomes Short Term: Continue to assess and modify interventions until short term weight is achieved;Long Term: Adherence to nutrition and physical activity/exercise program aimed toward attainment of established weight goal;Weight Loss: Understanding of general recommendations for a balanced deficit meal plan, which promotes 1-2 lb weight loss per week and includes a negative energy balance of [phone] kcal/d;Understanding recommendations for meals to include 15-35% energy as protein, 25-35% energy from fat, 35-60% energy from carbohydrates, less than 200mg  of dietary cholesterol, 20-35 gm of total fiber daily;Understanding of distribution of calorie intake throughout the day with the consumption of 4-5 meals/snacks    Diabetes Yes    Intervention Provide education about signs/symptoms and action to take for hypo/hyperglycemia.;Provide education about proper nutrition, including hydration, and aerobic/resistive exercise prescription along with prescribed medications to achieve blood glucose in normal ranges: Fasting glucose 65-99 mg/dL    Expected Outcomes Short Term: Participant verbalizes understanding of the signs/symptoms and immediate care of hyper/hypoglycemia, proper foot care and importance of medication, aerobic/resistive exercise and nutrition plan for blood glucose control.;Long Term: Attainment of HbA1C < 7%.    Heart Failure Yes    Intervention Provide a combined exercise and nutrition program that is supplemented with education, support and  counseling about heart failure. Directed toward relieving symptoms such as shortness of breath, decreased exercise tolerance, and extremity edema.    Expected Outcomes Short term: Attendance in program 2-3 days a week with increased exercise capacity. Reported lower sodium intake. Reported increased fruit and vegetable intake. Reports medication compliance.;Short term: Daily weights obtained and reported for increase. Utilizing diuretic protocols set by physician.;Long term: Adoption of self-care skills and reduction of barriers for early signs and symptoms recognition and intervention leading to self-care maintenance.    Hypertension Yes    Intervention Provide education on lifestyle modifcations including regular physical activity/exercise, weight management, moderate sodium restriction and increased consumption of fresh fruit, vegetables, and low fat dairy, alcohol moderation, and smoking cessation.;Monitor prescription use compliance.    Expected Outcomes Short Term: Continued assessment and intervention until BP is < 140/10mm HG in hypertensive participants. < 130/47mm HG in hypertensive participants with diabetes, heart failure or chronic kidney disease.;Long Term: Maintenance of blood pressure at goal levels.    Lipids Yes    Intervention Provide education and support for participant on nutrition & aerobic/resistive exercise along with prescribed medications to achieve LDL 70mg , HDL >40mg .    Expected Outcomes Short Term: Participant states understanding of desired cholesterol values and is compliant with medications prescribed. Participant is following exercise prescription and nutrition guidelines.;Long Term: Cholesterol controlled with medications as prescribed, with individualized exercise RX and with personalized nutrition plan. Value goals: LDL < 70mg , HDL > 40 mg.    Stress Yes    Intervention Offer individual and/or small group education and counseling on adjustment to heart disease, stress  management and health-related lifestyle change. Teach and support self-help strategies.;Refer participants experiencing significant psychosocial distress to  appropriate mental health specialists for further evaluation and treatment. When possible, include family members and significant others in education/counseling sessions.    Expected Outcomes Short Term: Participant demonstrates changes in health-related behavior, relaxation and other stress management skills, ability to obtain effective social support, and compliance with psychotropic medications if prescribed.;Long Term: Emotional wellbeing is indicated by absence of clinically significant psychosocial distress or social isolation.    Personal Goal Other Yes    Personal Goal Short term: walk longer without SOB and CP Long term: lose wt and gain endurance    Intervention Will continue to monitor pt and progress workloads as tolerated without sign or symptom    Expected Outcomes Pt will achieve her goals             Core Components/Risk Factors/Patient Goals Review:    Core Components/Risk Factors/Patient Goals at Discharge (Final Review):    ITP Comments:  ITP Comments     Row Name 11/01/20 1543           ITP Comments Dr Fransico Him MD, Medical Director                Comments: Tanicka attended orientation on 11/02/2020 to review rules and guidelines for program.  Completed 6 minute walk test, Intitial ITP, and exercise prescription.  VSS. Telemetry-Sinus Rhythm with occasional PVC. Jahaira reported having a 5/10 headache and 9/10 left side and hip pain both resolved with rest. . Safety measures and social distancing in place per CDC guidelines. No reports of chest pain during the 6 minute walk test.Olivine Hiers Venetia Maxon, RN,BSN 11/02/2020 11:39 AM

## 2020-11-05 ENCOUNTER — Encounter (HOSPITAL_COMMUNITY): Payer: Federal, State, Local not specified - PPO

## 2020-11-05 ENCOUNTER — Telehealth (HOSPITAL_COMMUNITY): Payer: Self-pay | Admitting: Family Medicine

## 2020-11-07 ENCOUNTER — Encounter (HOSPITAL_COMMUNITY): Payer: Federal, State, Local not specified - PPO

## 2020-11-07 ENCOUNTER — Telehealth (HOSPITAL_COMMUNITY): Payer: Self-pay | Admitting: *Deleted

## 2020-11-07 NOTE — Telephone Encounter (Signed)
Called to speak to Tina [name]. Mailbox full unable to leave message.[name], RN,BSN 11/07/2020 3:15 PM

## 2020-11-08 ENCOUNTER — Ambulatory Visit (INDEPENDENT_AMBULATORY_CARE_PROVIDER_SITE_OTHER): Payer: Federal, State, Local not specified - PPO | Admitting: Ophthalmology

## 2020-11-08 ENCOUNTER — Other Ambulatory Visit: Payer: Self-pay

## 2020-11-08 ENCOUNTER — Encounter (INDEPENDENT_AMBULATORY_CARE_PROVIDER_SITE_OTHER): Payer: Self-pay | Admitting: Ophthalmology

## 2020-11-08 DIAGNOSIS — E113511 Type 2 diabetes mellitus with proliferative diabetic retinopathy with macular edema, right eye: Secondary | ICD-10-CM

## 2020-11-08 DIAGNOSIS — H35073 Retinal telangiectasis, bilateral: Secondary | ICD-10-CM

## 2020-11-08 NOTE — Assessment & Plan Note (Signed)
Patient continues to struggle with use of BiPAP because of nasal congestion and allergy symptoms

## 2020-11-08 NOTE — Assessment & Plan Note (Signed)
Focal retinal thickenin  persists temporal to the fovea OD, will deliver limited focal laser treatment to this right eye today

## 2020-11-08 NOTE — Progress Notes (Signed)
11/08/2020     CHIEF COMPLAINT Patient presents for  Chief Complaint  Patient presents with   Retina Follow Up      HISTORY OF PRESENT ILLNESS: Tina [name] is a 50 y.o. female who presents to the clinic today for:   HPI     Retina Follow Up   Patient presents with  Diabetic Retinopathy.  In right eye.  This started 2 weeks ago.  Duration of 2 weeks.  Since onset it is stable.        Comments   2 week Focal Laser treatment OD with OCT  Pt states she is unable to sleep through the night with her CPAP currently due to nasal congestion from allergies. Otherwise, she is able to wear regularly.       Last edited by Reather Littler, COA on 11/08/2020  9:29 AM.      Referring physician: [name], DO [place],  [place] [id number]  HISTORICAL INFORMATION:   Selected notes from the MEDICAL RECORD NUMBER    Lab Results  Component Value Date   HGBA1C 8.4 (H) 08/13/2020     CURRENT MEDICATIONS: No current outpatient medications on file. (Ophthalmic Drugs)   No current facility-administered medications for this visit. (Ophthalmic Drugs)   Current Outpatient Medications (Other)  Medication Sig   albuterol (VENTOLIN HFA) 108 (90 Base) MCG/ACT inhaler Inhale 2 puffs into the lungs every 4 (four) hours as needed for wheezing or shortness of breath.   allopurinol (ZYLOPRIM) 100 MG tablet Take 100 mg by mouth at bedtime.   amLODipine (NORVASC) 5 MG tablet Take 1 tablet (5 mg total) by mouth daily.   aspirin EC 81 MG tablet Take 81 mg by mouth at bedtime. Swallow whole.   atorvastatin (LIPITOR) 40 MG tablet Take 1 tablet (40 mg total) by mouth at bedtime.   carvedilol (COREG) 25 MG tablet Take 25 mg by mouth 2 (two) times daily.   cetirizine (ZYRTEC) 10 MG tablet Take 10 mg by mouth daily as needed for allergies (itching).   cloNIDine (CATAPRES) 0.2 MG tablet Take 0.4 mg by mouth 2 (two) times daily.   clopidogrel (PLAVIX) 75 MG tablet  Take 1 tablet by mouth once daily   Continuous Blood Gluc Sensor (FREESTYLE LIBRE 2 SENSOR) MISC Inject 1 Device into the skin every 14 (fourteen) days.   DULoxetine (CYMBALTA) 20 MG capsule Take 20-40 mg by mouth See admin instructions. Take 20 mg by mouth in the morning and 40 mg at bedtime   EPINEPHrine (AUVI-Q) 0.3 mg/0.3 mL IJ SOAJ injection Inject 0.3 mg into the muscle as needed for anaphylaxis.   famotidine (PEPCID) 20 MG tablet Take 1 tablet (20 mg total) by mouth 2 (two) times daily.   furosemide (LASIX) 40 MG tablet Take 40 mg by mouth in the morning.   gabapentin (NEURONTIN) 300 MG capsule TAKE 2 CAPSULES BY MOUTH THREE TIMES DAILY   Insulin Aspart FlexPen (NOVOLOG) 100 UNIT/ML Inject 0-25 Units into the skin 3 (three) times daily before meals. Dose per sliding scale.   Insulin Glargine (BASAGLAR KWIKPEN) 100 UNIT/ML Inject 22 Units into the skin at bedtime. (Patient taking differently: Inject 18 Units into the skin at bedtime.)   isosorbide mononitrate (IMDUR) 60 MG 24 hr tablet Take 1 tablet (60 mg total) by mouth daily.   metoCLOPramide (REGLAN) 10 MG tablet Take 1 tablet (10 mg total) by mouth every 8 (eight) hours as needed for up to 5  days for nausea.   omeprazole (PRILOSEC) 40 MG capsule Take 1 capsule by mouth once daily (Patient taking differently: Take 40 mg by mouth daily as needed (acid reflux).)   Semaglutide, 1 MG/DOSE, (OZEMPIC, 1 MG/DOSE,) 2 MG/1.5ML SOPN Inject 1 mg into the skin every Friday.   tiZANidine (ZANAFLEX) 2 MG tablet Take 1 tablet (2 mg total) by mouth at bedtime.   traMADol (ULTRAM) 50 MG tablet Take 1 tablet by mouth twice daily (Patient taking differently: Take 50 mg by mouth at bedtime.)   No current facility-administered medications for this visit. (Other)      REVIEW OF SYSTEMS:    ALLERGIES Allergies  Allergen Reactions   Contrast Media [Iodinated Diagnostic Agents] Shortness Of Breath and Other (See Comments)    Difficulty breathing    Iodine Anaphylaxis   Iohexol Hives, Nausea And Vomiting, Swelling and Other (See Comments)     Desc: Magnevist-gadolinium-difficulty breathing, throat swelling    Midazolam Hcl Anaphylaxis    Difficulty breathing   Other Shortness Of Breath, Itching and Other (See Comments)    Patient is allergic to all nuts except peanuts, which are NOT "nuts"- they are legumes   Shellfish Allergy Anaphylaxis   Valsartan Swelling   Metformin And Related Diarrhea, Nausea And Vomiting and Other (See Comments)    Dehydration, also   [place] [Dapagliflozin] Nausea Only and Other (See Comments)    UTI, headaches and muscle aches, also   Hydralazine Other (See Comments)    Reaction not recalled   Saxagliptin Hives, Nausea And Vomiting and Other (See Comments)    ONGLYZA- dehydration, also   Spironolactone Other (See Comments)    Elevated potassium- Severe hyperkalemia   Avandia [Rosiglitazone Maleate] Hives and Other (See Comments)   Geodon [Ziprasidone] Other (See Comments)    Reaction not recalled   Kiwi Extract Itching and Swelling   Latex Itching    PAST MEDICAL HISTORY Past Medical History:  Diagnosis Date   Allergy    Anemia    DURING MENSES--HAS HEAVY BLEEDING WITH PERODS   Angioedema 08/13/2020   Anxiety    Back pain, chronic    "ongoing"   Blood transfusion    IN 2012  AFTER C -SECTION   Cerebral thrombosis with cerebral infarction ([place]) 06/2009   RIGHT SIDED WEAKNESS ( ARM AND LEG ) AND SPASMS-remains with slight weakness and vertigo.   Constipation    Depression    Diabetes mellitus    Diabetic neuropathy (HCC)    BOTH FEET --COMES AND GOES   Edema, lower extremity    Endometriosis    Fatty liver    GERD (gastroesophageal reflux disease)    with pregnancy   H/O eye surgery    Headache(784.0)    MIGRAINES--NOT REALLY HEADACHE-MORE LIKE PRESSURE SENSATION IN HEAD-FEELS DIZZIY AND  FAINT AS THE PRESSURE RESOLVES   Hernia, incisional, RLQ, s/p lap repair Sep 2013 09/02/2011    History of vertigo 03/22/2018   Hx of migraines 10/19/2011   Hyperlipidemia    Hypertension    Leg pain, right    "like bad Charley horse"   Multiple food allergies    Panic disorder without agoraphobia    Rash    HANDS, ARMS --STATES HX OF RASH [place].  STATES THE RASH OFTEN OCCURS WHEN SHE IS REALLY STRESSED."goes and comes-presently left ring finger"   Restless leg syndrome    DIAGNOSED BY SLEEP STUDY - PT TOLD SHE DID NOT HAVE SLEEP APNEA   Right rotator  cuff tear    PAIN IN RIGHT SHOULDER   SBO (small bowel obstruction) (HCC) 06/03/2012   Shortness of breath    Spastic hemiplegia affecting dominant side (HCC)    Stomach problems    Stroke (HCC)    Ventral hernia    RIGHT LOWER QUADRANT-CAUSING SOME PAIN   Weakness of right side of body    Past Surgical History:  Procedure Laterality Date   APPLICATION OF WOUND VAC N/A 05/25/2015   Procedure: APPLICATION OF WOUND VAC;  Surgeon: Michael Boston, MD;  Location: [facility];  Service: General;  Laterality: N/A;   CARDIAC CATHETERIZATION     CESAREAN SECTION  2012   COLONOSCOPY     CORONARY STENT INTERVENTION N/A 09/05/2020   Procedure: CORONARY STENT INTERVENTION;  Surgeon: Nigel Mormon, MD;  Location: [place];  Service: Cardiovascular;  Laterality: N/A;   DIAGNOSTIC LAPAROSCOPY     ESOPHAGOGASTRODUODENOSCOPY N/A 06/03/2012   Procedure: ESOPHAGOGASTRODUODENOSCOPY (EGD);  Surgeon: [name] Craver, MD;  [facility];  Service: Endoscopy;  Laterality: N/A;   EXCISION MASS ABDOMINAL N/A 05/25/2015   Procedure: ABDOMINAL WALL EXPLORATION EXCISION OF SEROMA REMOVAL OF REDUNDANT SKIN ;  Surgeon: Michael Boston, MD;  Location: [facility];  Service: General;  Laterality: N/A;   EYE SURGERY     Eye laser for vessel hemorrhaging   fybroid removal     HERNIA REPAIR  10/03/2011   ventral hernia repair   INSERTION OF MESH N/A 02/04/2013   Procedure: INSERTION OF MESH;  Surgeon: Adin Hector, MD;   Location: [facility];  Service: General;  Laterality: N/A;   INTRAVASCULAR ULTRASOUND/IVUS N/A 09/05/2020   Procedure: Intravascular Ultrasound/IVUS;  Surgeon: Nigel Mormon, MD;  Location: [place] [facility];  Service: Cardiovascular;  Laterality: N/A;   LEFT HEART CATH AND CORONARY ANGIOGRAPHY N/A 09/05/2020   Procedure: LEFT HEART CATH AND CORONARY ANGIOGRAPHY;  Surgeon: Nigel Mormon, MD;  Location: [place];  Service: Cardiovascular;  Laterality: N/A;   UMBILICAL HERNIA REPAIR N/A 02/04/2013   Procedure: LAPAROSCOPIC ventral wall hernia repair LAPAROSCOPIC LYSIS OF ADHESIONS laparoscopic exploration of abdomen ;  Surgeon: Adin Hector, MD;  Location: [facility];  Service: General;  Laterality: N/A;   UPPER GASTROINTESTINAL ENDOSCOPY     URETER REVISION     Bilateral "twisted"   UTERINE FIBROID SURGERY     2 SURGERIES FOR FIBROIDS   VENTRAL HERNIA REPAIR  10/03/2011   Procedure: LAPAROSCOPIC VENTRAL HERNIA;  Surgeon: Adin Hector, MD;  Location: [facility];  Service: General;  Laterality: N/A;    FAMILY HISTORY Family History  Problem Relation Age of Onset   Diabetes Father    Kidney disease Father    Depression Father    Drug abuse Father    Allergic rhinitis Mother    Eczema Mother    Urticaria Mother    Depression Mother    Anxiety disorder Mother    Bipolar disorder Mother    Alcoholism Mother    Drug abuse Mother    Eating disorder Mother    Diabetes Maternal Grandmother    Hyperlipidemia Paternal Grandmother    Stroke Paternal Grandmother    Eczema Sister    Urticaria Sister    Colon cancer Paternal Uncle    Other Neg Hx    Angioedema Neg Hx    Asthma Neg Hx    Colon polyps Neg Hx    Esophageal cancer Neg Hx    Rectal cancer Neg Hx  Stomach cancer Neg Hx     SOCIAL HISTORY Social History   Tobacco Use   Smoking status: Never   Smokeless tobacco: Never  Vaping Use   Vaping Use: Never used  Substance Use Topics   Alcohol use: No     Alcohol/week: 0.0 standard drinks   Drug use: No         OPHTHALMIC EXAM:  Base Eye Exam     Visual Acuity (ETDRS)       Right Left   Dist [place] 20/20 -2 20/20 -1         Tonometry (Tonopen, 9:33 AM)       Right Left   Pressure 9 15         Pupils       Pupils Dark Light Shape React APD   Right PERRL 4 3 Round Brisk None   Left PERRL 4 3 Round Brisk None         Visual Fields (Counting fingers)       Left Right    Full Full         Extraocular Movement       Right Left    Full, Ortho Full, Ortho         Neuro/Psych     Oriented x3: Yes   Mood/Affect: Normal         Dilation     Right eye: 1.0% Mydriacyl, 2.5% Phenylephrine @ 9:33 AM           Slit Lamp and Fundus Exam     External Exam       Right Left   External Normal Normal         Slit Lamp Exam       Right Left   Lids/Lashes Normal Normal   Conjunctiva/Sclera White and quiet White and quiet   Cornea Clear Clear   Anterior Chamber Deep and quiet Deep and quiet   Iris Round and reactive Round and reactive   Lens Centered Posterior chamber intraocular lens, 2+ Posterior capsular opacification Posterior chamber intraocular lens   Anterior Vitreous Normal Normal         Fundus Exam       Right Left   Posterior Vitreous Vitrectomized Vitrectomized   Disc Normal Normal   C/D Ratio 0.2 0.1   Macula Microaneurysms, no exudates, no macular thickening clinical, Moderate clinically significant macular edema no exudates, no macular thickening, no clinically significant macular edema, Microaneurysms   Vessels PDR-quiet PDR-quiet   Periphery Good PRP, a attached Good PRP, a attached            IMAGING AND PROCEDURES  Imaging and Procedures for 11/08/20  OCT, Retina - OU - Both Eyes       Right Eye Quality was good. Scan locations included subfoveal. Central Foveal Thickness: 338. Progression has been stable. Findings include cystoid macular edema, abnormal foveal  contour.   Left Eye Quality was good. Scan locations included subfoveal. Central Foveal Thickness: 361. Progression has been stable. Findings include normal foveal contour.   Notes Temporal thickening   OD, temporal CSME present needs focal laser OD today       Focal Laser - OD - Right Eye       Time Out Confirmed correct patient, procedure, site, and patient consented.   Anesthesia Topical anesthesia was used. Anesthetic medications included Proparacaine 0.5%.   Laser Information The type of laser was diode. Color was yellow. The duration in seconds was 0.1.  The spot size was 100 microns. Laser power was 50. Total spots was 77.   Post-op The patient tolerated the procedure well. There were no complications. The patient received written and verbal post procedure care education.   Notes Focal grid laser completed temporal fovea OD.             ASSESSMENT/PLAN:  Diabetic macular edema of right eye with proliferative retinopathy associated with type 2 diabetes mellitus (HCC) Focal retinal thickenin  persists temporal to the fovea OD, will deliver limited focal laser treatment to this right eye today  Type 2 macular telangiectasis, bilateral Patient continues to struggle with use of BiPAP because of nasal congestion and allergy symptoms       ICD-10-CM   1. Diabetic macular edema of right eye with proliferative retinopathy associated with type 2 diabetes mellitus (HCC)  E11.3511 OCT, Retina - OU - Both Eyes    Focal Laser - OD - Right Eye    2. Type 2 macular telangiectasis, bilateral  H35.073       1.  OD for focal laser today temporal to FAZ completed atraumatically  2.  OS, will continue to follow  3.  Ophthalmic Meds Ordered this visit:  No orders of the defined types were placed in this encounter.      Return in about 4 months (around 03/11/2021) for DILATE OU, COLOR FP, OCT.  There are no Patient Instructions on file for this  visit.   Explained the diagnoses, plan, and follow up with the patient and they expressed understanding.  Patient expressed understanding of the importance of proper follow up care.   Clent Demark Leaira Fullam M.D. Diseases & Surgery of the Retina and Vitreous Retina & Diabetic [place] 11/08/20     Abbreviations: M myopia (nearsighted); A astigmatism; H hyperopia (farsighted); P presbyopia; Mrx spectacle prescription;  CTL contact lenses; OD right eye; OS left eye; OU both eyes  XT exotropia; ET esotropia; PEK punctate epithelial keratitis; PEE punctate epithelial erosions; DES dry eye syndrome; MGD meibomian gland dysfunction; ATs artificial tears; PFAT's preservative free artificial tears; [place] nuclear sclerotic cataract; PSC posterior subcapsular cataract; ERM epi-retinal membrane; PVD posterior vitreous detachment; RD retinal detachment; DM diabetes mellitus; DR diabetic retinopathy; NPDR non-proliferative diabetic retinopathy; PDR proliferative diabetic retinopathy; CSME clinically significant macular edema; DME diabetic macular edema; dbh dot blot hemorrhages; CWS cotton wool spot; POAG primary open angle glaucoma; C/D cup-to-disc ratio; HVF humphrey visual field; GVF goldmann visual field; OCT optical coherence tomography; IOP intraocular pressure; BRVO Branch retinal vein occlusion; CRVO central retinal vein occlusion; CRAO central retinal artery occlusion; BRAO branch retinal artery occlusion; RT retinal tear; SB scleral buckle; PPV pars plana vitrectomy; VH Vitreous hemorrhage; PRP panretinal laser photocoagulation; IVK intravitreal kenalog; VMT vitreomacular traction; MH Macular hole;  NVD neovascularization of the disc; NVE neovascularization elsewhere; AREDS age related eye disease study; ARMD age related macular degeneration; POAG primary open angle glaucoma; EBMD epithelial/anterior basement membrane dystrophy; ACIOL anterior chamber intraocular lens; IOL intraocular lens; PCIOL posterior chamber  intraocular lens; Phaco/IOL phacoemulsification with intraocular lens placement; [place] photorefractive keratectomy; LASIK laser assisted in situ keratomileusis; HTN hypertension; DM diabetes mellitus; COPD chronic obstructive pulmonary disease

## 2020-11-09 ENCOUNTER — Encounter (HOSPITAL_COMMUNITY)
Admission: RE | Admit: 2020-11-09 | Discharge: 2020-11-09 | Disposition: A | Discharge status: Home / Self Care | Payer: Federal, State, Local not specified - PPO | Source: Ambulatory Visit | Attending: Cardiology | Admitting: Cardiology

## 2020-11-09 DIAGNOSIS — I214 Non-ST elevation (NSTEMI) myocardial infarction: Secondary | ICD-10-CM | POA: Diagnosis not present

## 2020-11-09 DIAGNOSIS — Z955 Presence of coronary angioplasty implant and graft: Secondary | ICD-10-CM | POA: Diagnosis not present

## 2020-11-09 NOTE — Progress Notes (Signed)
Daily Session Note  Patient Details  Name: RANEEM MENDOLIA MRN: [id number] Date of Birth: Sep 20, 1970 Referring Provider:   Flowsheet Row CARDIAC REHAB PHASE II ORIENTATION from 11/01/2020 in [facility]  Referring Provider Dr. Rex Kras, DO       Encounter Date: 11/09/2020  Check In:  Session Check In - 11/09/20 1524       Check-In   Supervising physician immediately available to respond to emergencies [facility] immediately available    Physician(s) Dr. Verlon Au    Location [facility]    Staff Present Maurice Small, RN, Quentin Ore, MS, ACSM-CEP, Exercise Physiologist;Portia Rollene Rotunda, RN, Marga Melnick, RN, BSN    Virtual Visit No    Medication changes reported     No    Fall or balance concerns reported    No    Tobacco Cessation No Change    Warm-up and Cool-down Performed on first and last piece of equipment    Resistance Training Performed Yes    VAD Patient? No    PAD/SET Patient? No      Pain Assessment   Currently in Pain? No/denies    Multiple Pain Sites No             Capillary Blood Glucose: No results found for this or any previous visit (from the past 24 hour(s)).   Exercise Prescription Changes - 11/09/20 1600       Response to Exercise   Blood Pressure (Admit) 124/80    Blood Pressure (Exercise) 124/80    Blood Pressure (Exit) 138/78    Heart Rate (Admit) 94 bpm    Heart Rate (Exercise) 93 bpm    Heart Rate (Exit) 83 bpm    Rating of Perceived Exertion (Exercise) 13    Duration Progress to 30 minutes of  aerobic without signs/symptoms of physical distress    Intensity THRR unchanged      Progression   Progression Continue to progress workloads to maintain intensity without signs/symptoms of physical distress.      Resistance Training   Training Prescription Yes    Weight 2    Reps 10-15      NuStep   Level 1    SPM 70    Minutes 25    METs 1.7              Social History   Tobacco Use  Smoking Status Never  Smokeless Tobacco Never    Goals Met:  No report of concerns or symptoms today  Goals Unmet:  Not Applicable  Comments: Pt started cardiac rehab today.  Pt tolerated light exercise fair.Cybill is deconditioned and reported having left groin pain from sciatica this resolved with rest. Yitzel took several rest breaks as needed during exercise today.  VSS, telemetry-Sinus Rhythm, asymptomatic.  Medication list reconciled. Pt denies barriers to medicaiton compliance.  PSYCHOSOCIAL ASSESSMENT:  PHQ-17. Pt exhibits positive coping skills,  with supportive family.Xoie's  Dog of 16 years passed away on the same Day that brother in law passed away from a brain tumor. Sayward admits to being  depressed. Patient denies any suicidal ideations. Taralynn says that she is on Cymbalta for neuropathy and not depression. Will forward Dreya's quality of life questionnaire to Dr Theda Sers. Jeanine's primary care physician. Pt oriented to exercise equipment and routine.    Understanding verbalized. Post exercise CBG 102. Did not use weights today. Patient left cardiac rehab without complaints today.Will continue to monitor the patient throughout  the program.Melvyn Hommes Venetia Maxon, RN,BSN 11/09/2020 4:47 PM    Dr. Fransico Him is Medical Director for Cardiac Rehab at [facility].

## 2020-11-10 ENCOUNTER — Other Ambulatory Visit: Payer: Self-pay | Admitting: Physical Medicine & Rehabilitation

## 2020-11-12 ENCOUNTER — Other Ambulatory Visit: Payer: Self-pay

## 2020-11-12 ENCOUNTER — Encounter (HOSPITAL_COMMUNITY)
Admission: RE | Admit: 2020-11-12 | Discharge: 2020-11-12 | Disposition: A | Discharge status: Home / Self Care | Payer: Federal, State, Local not specified - PPO | Source: Ambulatory Visit | Attending: Cardiology | Admitting: Cardiology

## 2020-11-12 DIAGNOSIS — Z955 Presence of coronary angioplasty implant and graft: Secondary | ICD-10-CM

## 2020-11-12 DIAGNOSIS — I214 Non-ST elevation (NSTEMI) myocardial infarction: Secondary | ICD-10-CM

## 2020-11-12 LAB — GLUCOSE, CAPILLARY
Glucose-Capillary: 190 mg/dL — ABNORMAL HIGH (ref 70–99)
Glucose-Capillary: 136 mg/dL — ABNORMAL HIGH (ref 70–99)

## 2020-11-12 NOTE — Progress Notes (Signed)
QUALITY OF LIFE SCORE REVIEW  Pt completed Quality of Life survey as a participant in Cardiac Rehab.  Scores 21.0 or below are considered low.  Pt score very low in several areas Overall 18.60, Health and Function 13.20, socioeconomic 24.38, physiological and spiritual 18.00, family 26.40. Patient quality of life slightly altered by physical constraints which limits ability to perform as prior to recent cardiac illness.Tina [name] admits to feeling depressed as her dog who was 47 years old passed away and her brother in law on the same day. Tina [name] says that she and her family are receiving group counseling from a spiritual advisor.  Tina [name] says that she is taking Cymbalta for neuropathy not for depression Offered emotional support and reassurance.  Will continue to monitor and intervene as necessary. Will forward Quality of life questionnaire and CBG's from Friday's session to Dr Hinton Dyer Collin's office per Dr Brennan Bailey Chalmers Cater, RN,BSN 11/12/2020 6:14 PM

## 2020-11-12 NOTE — Progress Notes (Signed)
Please send her sugar readings to her PCP.   Dr. Terri Skains

## 2020-11-12 NOTE — Progress Notes (Signed)
Incomplete Session Note  Patient Details  Name: Tina [name] MRN: [id number] Date of Birth: 05-Feb-1970 Referring Provider:   Flowsheet Row CARDIAC REHAB PHASE II ORIENTATION from 11/01/2020 in [facility]  Referring Provider Dr. Rex Kras, DO       Tina [name] did not complete her rehab session.  Tina [name] reported having vertigo earlier and had vomited. Advised the patient not to exercise today. Blood pressure 142/80. Heart rate 84. CBG 187. Oxygen saturation 99% on room air. [name] plans to return to exercise on Wednesday if she is feeling better. Patient states understanding and left cardiac rehab without complaints.[name], RN,BSN 11/12/2020 4:47 PM

## 2020-11-13 ENCOUNTER — Telehealth: Payer: Self-pay

## 2020-11-13 NOTE — Telephone Encounter (Signed)
An RN with [facility] called and stated that pt started cardiac rehab and she has gained 6 pounds. No SOB and her legs are still swollen but it is normal for her and has not gotten worse. She has been doing some emotional eating but she does not feel as though this is causing it.   Weights: 11/06/2020- 250 lbs 11/07/2020- 249 lbs 11/08/2020- 254 lbs 11/13/2020- 256 lbs

## 2020-11-14 ENCOUNTER — Other Ambulatory Visit: Payer: Self-pay

## 2020-11-14 ENCOUNTER — Encounter (HOSPITAL_COMMUNITY)
Admission: RE | Admit: 2020-11-14 | Discharge: 2020-11-14 | Disposition: A | Discharge status: Home / Self Care | Payer: Federal, State, Local not specified - PPO | Source: Ambulatory Visit | Attending: Cardiology | Admitting: Cardiology

## 2020-11-14 DIAGNOSIS — I214 Non-ST elevation (NSTEMI) myocardial infarction: Secondary | ICD-10-CM | POA: Insufficient documentation

## 2020-11-14 DIAGNOSIS — Z955 Presence of coronary angioplasty implant and graft: Secondary | ICD-10-CM | POA: Insufficient documentation

## 2020-11-14 NOTE — Progress Notes (Signed)
Cardiac Individual Treatment Plan  Patient Details  Name: Tina [name] MRN: [id number] Date of Birth: 1970-02-12 Referring Provider:   Flowsheet Row CARDIAC REHAB PHASE II ORIENTATION from 11/01/2020 in [facility]  Referring Provider Dr. Rex Kras, DO       Initial Encounter Date:  [place] from 11/01/2020 in [facility]  Date 11/01/20       Visit Diagnosis: 09/05/20 NSTEMI (non-ST elevated myocardial infarction) ([place])  09/05/20 S/P DES OM2  Patient's Home Medications on Admission:  Current Outpatient Medications:    albuterol (VENTOLIN HFA) 108 (90 Base) MCG/ACT inhaler, Inhale 2 puffs into the lungs every 4 (four) hours as needed for wheezing or shortness of breath., Disp: 18 g, Rfl: 1   allopurinol (ZYLOPRIM) 100 MG tablet, Take 100 mg by mouth at bedtime., Disp: , Rfl:    amLODipine (NORVASC) 5 MG tablet, Take 1 tablet (5 mg total) by mouth daily., Disp: 90 tablet, Rfl: 0   aspirin EC 81 MG tablet, Take 81 mg by mouth at bedtime. Swallow whole., Disp: , Rfl:    atorvastatin (LIPITOR) 40 MG tablet, Take 1 tablet (40 mg total) by mouth at bedtime., Disp: 90 tablet, Rfl: 0   carvedilol (COREG) 25 MG tablet, Take 25 mg by mouth 2 (two) times daily., Disp: , Rfl:    cetirizine (ZYRTEC) 10 MG tablet, Take 10 mg by mouth daily as needed for allergies (itching)., Disp: , Rfl:    cloNIDine (CATAPRES) 0.2 MG tablet, Take 0.4 mg by mouth 2 (two) times daily., Disp: , Rfl:    clopidogrel (PLAVIX) 75 MG tablet, Take 1 tablet by mouth once daily, Disp: 30 tablet, Rfl: 0   Continuous Blood Gluc Sensor (FREESTYLE LIBRE 2 SENSOR) MISC, Inject 1 Device into the skin every 14 (fourteen) days., Disp: , Rfl:    DULoxetine (CYMBALTA) 20 MG capsule, Take 20-40 mg by mouth See admin instructions. Take 20 mg by mouth in the morning and 40 mg at bedtime, Disp: , Rfl:    EPINEPHrine (AUVI-Q) 0.3  mg/0.3 mL IJ SOAJ injection, Inject 0.3 mg into the muscle as needed for anaphylaxis., Disp: 1 each, Rfl: 1   famotidine (PEPCID) 20 MG tablet, Take 1 tablet (20 mg total) by mouth 2 (two) times daily., Disp: 60 tablet, Rfl: 5   furosemide (LASIX) 40 MG tablet, Take 40 mg by mouth in the morning., Disp: , Rfl:    gabapentin (NEURONTIN) 300 MG capsule, TAKE 2 CAPSULES BY MOUTH THREE TIMES DAILY, Disp: 180 capsule, Rfl: 2   Insulin Aspart FlexPen (NOVOLOG) 100 UNIT/ML, Inject 0-25 Units into the skin 3 (three) times daily before meals. Dose per sliding scale., Disp: , Rfl:    Insulin Glargine (BASAGLAR KWIKPEN) 100 UNIT/ML, Inject 22 Units into the skin at bedtime. (Patient taking differently: Inject 18 Units into the skin at bedtime.), Disp: , Rfl:    isosorbide mononitrate (IMDUR) 60 MG 24 hr tablet, Take 1 tablet (60 mg total) by mouth daily., Disp: 30 tablet, Rfl: 3   metoCLOPramide (REGLAN) 10 MG tablet, Take 1 tablet (10 mg total) by mouth every 8 (eight) hours as needed for up to 5 days for nausea., Disp: 15 tablet, Rfl: 0   omeprazole (PRILOSEC) 40 MG capsule, Take 1 capsule by mouth once daily (Patient taking differently: Take 40 mg by mouth daily as needed (acid reflux).), Disp: 90 capsule, Rfl: 0   Semaglutide, 1 MG/DOSE, (OZEMPIC,  1 MG/DOSE,) 2 MG/1.5ML SOPN, Inject 1 mg into the skin every Friday., Disp: , Rfl:    tiZANidine (ZANAFLEX) 2 MG tablet, Take 1 tablet (2 mg total) by mouth at bedtime., Disp: 30 tablet, Rfl: 2   traMADol (ULTRAM) 50 MG tablet, Take 1 tablet by mouth twice daily, Disp: 60 tablet, Rfl: 0  Past Medical History: Past Medical History:  Diagnosis Date   Allergy    Anemia    DURING MENSES--HAS HEAVY BLEEDING WITH PERODS   Angioedema 08/13/2020   Anxiety    Back pain, chronic    "ongoing"   Blood transfusion    IN 2012  AFTER C -SECTION   Cerebral thrombosis with cerebral infarction ([place]) 06/2009   RIGHT SIDED WEAKNESS ( ARM AND LEG ) AND SPASMS-remains with  slight weakness and vertigo.   Constipation    Depression    Diabetes mellitus    Diabetic neuropathy (HCC)    BOTH FEET --COMES AND GOES   Edema, lower extremity    Endometriosis    Fatty liver    GERD (gastroesophageal reflux disease)    with pregnancy   H/O eye surgery    Headache(784.0)    MIGRAINES--NOT REALLY HEADACHE-MORE LIKE PRESSURE SENSATION IN HEAD-FEELS DIZZIY AND  FAINT AS THE PRESSURE RESOLVES   Hernia, incisional, RLQ, s/p lap repair Sep 2013 09/02/2011   History of vertigo 03/22/2018   Hx of migraines 10/19/2011   Hyperlipidemia    Hypertension    Leg pain, right    "like bad Charley horse"   Multiple food allergies    Panic disorder without agoraphobia    Rash    HANDS, ARMS --STATES HX OF RASH [place].  STATES THE RASH OFTEN OCCURS WHEN SHE IS REALLY STRESSED."goes and comes-presently left ring finger"   Restless leg syndrome    DIAGNOSED BY SLEEP STUDY - PT TOLD SHE DID NOT HAVE SLEEP APNEA   Right rotator cuff tear    PAIN IN RIGHT SHOULDER   SBO (small bowel obstruction) ([place]) 06/03/2012   Shortness of breath    Spastic hemiplegia affecting dominant side (HCC)    Stomach problems    Stroke (HCC)    Ventral hernia    RIGHT LOWER QUADRANT-CAUSING SOME PAIN   Weakness of right side of body     Tobacco Use: Social History   Tobacco Use  Smoking Status Never  Smokeless Tobacco Never    Labs: Recent Review Flowsheet Data     Labs for ITP Cardiac and Pulmonary Rehab Latest Ref Rng & Units 04/12/2019 08/02/2019 08/13/2020 09/05/2020 09/06/2020   Cholestrol 0 - 200 mg/dL - 191 - - 175   LDLCALC 0 - 99 mg/dL - 102 - - 92   HDL >40 mg/dL - 54 - - 56   Trlycerides <150 mg/dL - 207(A) - - 137   Hemoglobin A1c 4.8 - 5.6 % 10.1(H) 8.4 8.4(H) - -   PHART 7.350 - 7.450 - - - 7.384 -   PCO2ART 32.0 - 48.0 mmHg - - - 36.2 -   HCO3 20.0 - 28.0 mmol/L - - - 21.6 -   TCO2 22 - 32 mmol/L - - - 23 -   ACIDBASEDEF 0.0 - 2.0 mmol/L - - -  3.0(H) -   O2SAT % - - - 96.0 -       Capillary Blood Glucose: Lab Results  Component Value Date   GLUCAP 136 (H) 11/09/2020   GLUCAP 190 (H) 11/09/2020   GLUCAP  188 (H) 09/11/2020   GLUCAP 277 (H) 09/10/2020   GLUCAP 197 (H) 09/10/2020     Exercise Target Goals: Exercise Program Goal: Individual exercise prescription set using results from initial 6 min walk test and THRR while considering  patient's activity barriers and safety.   Exercise Prescription Goal: Initial exercise prescription builds to 30-45 minutes a day of aerobic activity, 2-3 days per week.  Home exercise guidelines will be given to patient during program as part of exercise prescription that the participant will acknowledge.  Activity Barriers & Risk Stratification:  Activity Barriers & Cardiac Risk Stratification - 11/01/20 1640       Activity Barriers & Cardiac Risk Stratification   Activity Barriers Chest Pain/Angina;Back Problems;Deconditioning;Balance Concerns;History of Falls;Shortness of Breath    Cardiac Risk Stratification High             6 Minute Walk:  6 Minute Walk     Row Name 11/01/20 1632         6 Minute Walk   Phase Initial     Distance 1262 feet     Walk Time 6 minutes     # of Rest Breaks 0     MPH 2.39     METS 3.31     RPE 15     Perceived Dyspnea  1     VO2 Peak 11.57     Symptoms Yes (comment)     Comments headache 5/10 pain. Left side, hip, back sciatica 9/10 (both relieved with rest)     Resting HR 82 bpm     Resting BP 130/80     Resting Oxygen Saturation  99 %     Exercise Oxygen Saturation  during 6 min walk 99 %     Max Ex. HR 96 bpm     Max Ex. BP 162/90     2 Minute Post BP 144/72              Oxygen Initial Assessment:   Oxygen Re-Evaluation:   Oxygen Discharge (Final Oxygen Re-Evaluation):   Initial Exercise Prescription:  Initial Exercise Prescription - 11/01/20 1600       Date of Initial Exercise RX and Referring Provider   Date  11/01/20    Referring Provider Dr. Rex Kras, DO    Expected Discharge Date 12/28/20      NuStep   Level 1    SPM 75    Minutes 25    METs 1.9      Prescription Details   Frequency (times per week) 3    Duration Progress to 30 minutes of continuous aerobic without signs/symptoms of physical distress      Intensity   THRR 40-80% of Max Heartrate 68-135    Ratings of Perceived Exertion 11-13    Perceived Dyspnea 0-4      Progression   Progression Continue progressive overload as per policy without signs/symptoms or physical distress.      Resistance Training   Training Prescription Yes    Weight 2    Reps 10-15             Perform Capillary Blood Glucose checks as needed.  Exercise Prescription Changes:   Exercise Prescription Changes     Row Name 11/09/20 1600             Response to Exercise   Blood Pressure (Admit) 124/80       Blood Pressure (Exercise) 124/80       Blood Pressure (Exit)  138/78       Heart Rate (Admit) 94 bpm       Heart Rate (Exercise) 93 bpm       Heart Rate (Exit) 83 bpm       Rating of Perceived Exertion (Exercise) 13       Duration Progress to 30 minutes of  aerobic without signs/symptoms of physical distress       Intensity THRR unchanged         Progression   Progression Continue to progress workloads to maintain intensity without signs/symptoms of physical distress.         Resistance Training   Training Prescription Yes       Weight 2       Reps 10-15         NuStep   Level 1       SPM 70       Minutes 25       METs 1.7                Exercise Comments:   Exercise Goals and Review:   Exercise Goals     Row Name 11/01/20 1643             Exercise Goals   Increase Physical Activity Yes       Intervention Provide advice, education, support and counseling about physical activity/exercise needs.;Develop an individualized exercise prescription for aerobic and resistive training based on initial  evaluation findings, risk stratification, comorbidities and participant's personal goals.       Expected Outcomes Short Term: Attend rehab on a regular basis to increase amount of physical activity.;Long Term: Add in home exercise to make exercise part of routine and to increase amount of physical activity.;Long Term: Exercising regularly at least 3-5 days a week.       Increase Strength and Stamina Yes       Intervention Provide advice, education, support and counseling about physical activity/exercise needs.;Develop an individualized exercise prescription for aerobic and resistive training based on initial evaluation findings, risk stratification, comorbidities and participant's personal goals.       Expected Outcomes Short Term: Increase workloads from initial exercise prescription for resistance, speed, and METs.;Short Term: Perform resistance training exercises routinely during rehab and add in resistance training at home;Long Term: Improve cardiorespiratory fitness, muscular endurance and strength as measured by increased METs and functional capacity (6MWT)       Able to understand and use rate of perceived exertion (RPE) scale Yes       Intervention Provide education and explanation on how to use RPE scale       Expected Outcomes Short Term: Able to use RPE daily in rehab to express subjective intensity level;Long Term:  Able to use RPE to guide intensity level when exercising independently       Able to understand and use Dyspnea scale Yes       Intervention Provide education and explanation on how to use Dyspnea scale       Expected Outcomes Short Term: Able to use Dyspnea scale daily in rehab to express subjective sense of shortness of breath during exertion;Long Term: Able to use Dyspnea scale to guide intensity level when exercising independently       Knowledge and understanding of Target Heart Rate Range (THRR) Yes       Intervention Provide education and explanation of THRR including how  the numbers were predicted and where they are located for reference  Expected Outcomes Short Term: Able to state/look up THRR;Long Term: Able to use THRR to govern intensity when exercising independently;Short Term: Able to use daily as guideline for intensity in rehab       Understanding of Exercise Prescription Yes       Intervention Provide education, explanation, and written materials on patient's individual exercise prescription       Expected Outcomes Short Term: Able to explain program exercise prescription;Long Term: Able to explain home exercise prescription to exercise independently                Exercise Goals Re-Evaluation :   Discharge Exercise Prescription (Final Exercise Prescription Changes):  Exercise Prescription Changes - 11/09/20 1600       Response to Exercise   Blood Pressure (Admit) 124/80    Blood Pressure (Exercise) 124/80    Blood Pressure (Exit) 138/78    Heart Rate (Admit) 94 bpm    Heart Rate (Exercise) 93 bpm    Heart Rate (Exit) 83 bpm    Rating of Perceived Exertion (Exercise) 13    Duration Progress to 30 minutes of  aerobic without signs/symptoms of physical distress    Intensity THRR unchanged      Progression   Progression Continue to progress workloads to maintain intensity without signs/symptoms of physical distress.      Resistance Training   Training Prescription Yes    Weight 2    Reps 10-15      NuStep   Level 1    SPM 70    Minutes 25    METs 1.7             Nutrition:  Target Goals: Understanding of nutrition guidelines, daily intake of sodium 1500mg , cholesterol 200mg , calories 30% from fat and 7% or less from saturated fats, daily to have 5 or more servings of fruits and vegetables.  Biometrics:  Pre Biometrics - 11/01/20 1631       Pre Biometrics   Waist Circumference 50 inches    Hip Circumference 46 inches    Waist to Hip Ratio 1.09 %    Triceps Skinfold 52 mm    % Body Fat 52.7 %    Grip Strength  25 kg    Flexibility 10 in    Single Leg Stand 30 seconds              Nutrition Therapy Plan and Nutrition Goals:   Nutrition Assessments:  MEDIFICTS Score Key: ?70 Need to make dietary changes  40-70 Heart Healthy Diet ? 40 Therapeutic Level Cholesterol Diet    Picture Your Plate Scores: <20 Unhealthy dietary pattern with much room for improvement. 41-50 Dietary pattern unlikely to meet recommendations for good health and room for improvement. 51-60 More healthful dietary pattern, with some room for improvement.  >60 Healthy dietary pattern, although there may be some specific behaviors that could be improved.    Nutrition Goals Re-Evaluation:   Nutrition Goals Re-Evaluation:   Nutrition Goals Discharge (Final Nutrition Goals Re-Evaluation):   Psychosocial: Target Goals: Acknowledge presence or absence of significant depression and/or stress, maximize coping skills, provide positive support system. Participant is able to verbalize types and ability to use techniques and skills needed for reducing stress and depression.  Initial Review & Psychosocial Screening:  Initial Psych Review & Screening - 11/02/20 1038       Initial Review   Current issues with Current Depression;Current Stress Concerns;History of Depression    Source of Stress Concerns Family;Chronic Illness;Unable  to participate in former interests or hobbies;Unable to perform yard/household activities    Comments Sidni is currently depressed and is taking an antidepressant. Mikel's Dog passes away recently. Lorrinda is rasing 3 boys one 4 and 51 year old twins with her husband.      Family Dynamics   Good Support System? Yes   Tashawn has her husband and sister in law for support     Barriers   Psychosocial barriers to participate in program The patient should benefit from training in stress management and relaxation.;Psychosocial barriers identified (see note)      Screening Interventions    Interventions To provide support and resources with identified psychosocial needs;Encouraged to exercise;Provide feedback about the scores to participant    Expected Outcomes Long Term Goal: Stressors or current issues are controlled or eliminated.;Long Term goal: The participant improves quality of Life and PHQ9 Scores as seen by post scores and/or verbalization of changes;Short Term goal: Identification and review with participant of any Quality of Life or Depression concerns found by scoring the questionnaire.             Quality of Life Scores:  Quality of Life - 11/01/20 1653       Quality of Life   Select Quality of Life      Quality of Life Scores   Health/Function Pre 13.2 %    Socioeconomic Pre 24.38 %    Psych/Spiritual Pre 18 %    Family Pre 26.4 %    GLOBAL Pre 18.6 %            Scores of 19 and below usually indicate a poorer quality of life in these areas.  A difference of  2-3 points is a clinically meaningful difference.  A difference of 2-3 points in the total score of the Quality of Life Index has been associated with significant improvement in overall quality of life, self-image, physical symptoms, and general health in studies assessing change in quality of life.  PHQ-9: Recent Review Flowsheet Data     Depression screen [facility] 2/9 11/09/2020 11/02/2020 10/30/2020 07/27/2019 02/09/2019   Decreased Interest 3 0 1 0 0   Down, Depressed, Hopeless 3 1 1 1 1    PHQ - 2 Score 6 1 2 1 1    Altered sleeping 3 - - - -   Tired, decreased energy 3 - - - -   Change in appetite 2 - - - -   Feeling bad or failure about yourself  0 - - - -   Trouble concentrating 2 - - - -   Moving slowly or fidgety/restless 1 - - - -   Suicidal thoughts 0 - - - -   PHQ-9 Score 17 - - - -   Difficult doing work/chores Somewhat difficult - - - -      Interpretation of Total Score  Total Score Depression Severity:  1-4 = Minimal depression, 5-9 = Mild depression, 10-14 = Moderate  depression, 15-19 = Moderately severe depression, 20-27 = Severe depression   Psychosocial Evaluation and Intervention:   Psychosocial Re-Evaluation:  Psychosocial Re-Evaluation     [place] Name 11/13/20 1431             Psychosocial Re-Evaluation   Current issues with Current Depression;History of Depression;Current Stress Concerns       Comments Quality of life questionnaire reviewed. Admits to being currently depressed. Forwarded results to Dr Theda Sers, primary care provider. Korra says she is receiving spiritual counselling as a goup  for the recent loss of her brother in law from cancer       Expected Outcomes Will continue to monitor and offer support as needed.       Interventions Stress management education;Relaxation education;Encouraged to attend Cardiac Rehabilitation for the exercise       Continue Psychosocial Services  Follow up required by staff         Initial Review   Source of Stress Concerns Chronic Illness;Unable to participate in former interests or hobbies;Unable to perform yard/household activities       Comments Will continue to monitor and offer support as needed.                Psychosocial Discharge (Final Psychosocial Re-Evaluation):  Psychosocial Re-Evaluation - 11/13/20 1431       Psychosocial Re-Evaluation   Current issues with Current Depression;History of Depression;Current Stress Concerns    Comments Quality of life questionnaire reviewed. Admits to being currently depressed. Forwarded results to Dr Theda Sers, primary care provider. Gay says she is receiving spiritual counselling as a goup for the recent loss of her brother in law from cancer    Expected Outcomes Will continue to monitor and offer support as needed.    Interventions Stress management education;Relaxation education;Encouraged to attend Cardiac Rehabilitation for the exercise    Continue Psychosocial Services  Follow up required by staff      Initial Review   Source of Stress  Concerns Chronic Illness;Unable to participate in former interests or hobbies;Unable to perform yard/household activities    Comments Will continue to monitor and offer support as needed.             Vocational Rehabilitation: Provide vocational rehab assistance to qualifying candidates.   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            Education: Education Goals: Education classes will be provided on a weekly basis, covering required topics. Participant will state understanding/return demonstration of topics presented.  Learning Barriers/Preferences:  Learning Barriers/Preferences - 11/01/20 1654       Learning Barriers/Preferences   Learning Barriers Exercise Concerns   weakness in right leg and right hand   Learning Preferences Group Instruction;Individual Instruction;Pictoral;Skilled Demonstration;Video;Written Material             Education Topics: Count Your Pulse:  -Group instruction provided by verbal instruction, demonstration, patient participation and written materials to support subject.  Instructors address importance of being able to find your pulse and how to count your pulse when at home without a heart monitor.  Patients get hands on experience counting their pulse with staff help and individually.   Heart Attack, Angina, and Risk Factor Modification:  -Group instruction provided by verbal instruction, video, and written materials to support subject.  Instructors address signs and symptoms of angina and heart attacks.    Also discuss risk factors for heart disease and how to make changes to improve heart health risk factors.   Functional Fitness:  -Group instruction provided by verbal instruction, demonstration, patient  participation, and written materials to support subject.  Instructors address safety measures for doing things around the house.  Discuss how to get up and down off the floor, how to pick things up properly, how to safely get out  of a chair without assistance, and balance training.   Meditation and Mindfulness:  -Group instruction provided by verbal instruction, patient participation, and written materials to support subject.  Instructor addresses importance of mindfulness and meditation practice to help reduce stress and improve awareness.  Instructor also leads participants through a meditation exercise.    Stretching for Flexibility and Mobility:  -Group instruction provided by verbal instruction, patient participation, and written materials to support subject.  Instructors lead participants through series of stretches that are designed to increase flexibility thus improving mobility.  These stretches are additional exercise for major muscle groups that are typically performed during regular warm up and cool down.   Hands Only CPR:  -Group verbal, video, and participation provides a basic overview of AHA guidelines for community CPR. Role-play of emergencies allow participants the opportunity to practice calling for help and chest compression technique with discussion of AED use.   Hypertension: -Group verbal and written instruction that provides a basic overview of hypertension including the most recent diagnostic guidelines, risk factor reduction with self-care instructions and medication management.    Nutrition I class: Heart Healthy Eating:  -Group instruction provided by PowerPoint slides, verbal discussion, and written materials to support subject matter. The instructor gives an explanation and review of the Therapeutic Lifestyle Changes diet recommendations, which includes a discussion on lipid goals, dietary fat, sodium, fiber, plant stanol/sterol esters, sugar, and the components of  a well-balanced, healthy diet.   Nutrition II class: Lifestyle Skills:  -Group instruction provided by PowerPoint slides, verbal discussion, and written materials to support subject matter. The instructor gives an explanation and review of label reading, grocery shopping for heart health, heart healthy recipe modifications, and ways to make healthier choices when eating out.   Diabetes Question & Answer:  -Group instruction provided by PowerPoint slides, verbal discussion, and written materials to support subject matter. The instructor gives an explanation and review of diabetes co-morbidities, pre- and post-prandial blood glucose goals, pre-exercise blood glucose goals, signs, symptoms, and treatment of hypoglycemia and hyperglycemia, and foot care basics.   Diabetes Blitz:  -Group instruction provided by PowerPoint slides, verbal discussion, and written materials to support subject matter. The instructor gives an explanation and review of the physiology behind type 1 and type 2 diabetes, diabetes medications and rational behind using different medications, pre- and post-prandial blood glucose recommendations and Hemoglobin A1c goals, diabetes diet, and exercise including blood glucose guidelines for exercising safely.    Portion Distortion:  -Group instruction provided by PowerPoint slides, verbal discussion, written materials, and food models to support subject matter. The instructor gives an explanation of serving size versus portion size, changes in portions sizes over the last 20 years, and what consists of a serving from each food group.   Stress Management:  -Group instruction provided by verbal instruction, video, and written materials to support subject matter.  Instructors review role of stress in heart disease and how to cope with stress positively.     Exercising on Your Own:  -Group instruction provided by verbal instruction, power point, and written materials to support  subject.  Instructors discuss benefits of exercise, components of exercise, frequency and intensity of exercise, and end points for exercise.  Also discuss use of nitroglycerin and activating EMS.  Review options of places to exercise outside of rehab.  Review guidelines for sex with heart disease.   Cardiac Drugs I:  -Group instruction provided by verbal instruction and written materials to support subject.  Instructor reviews cardiac  drug classes: antiplatelets, anticoagulants, beta blockers, and statins.  Instructor discusses reasons, side effects, and lifestyle considerations for each drug class.   Cardiac Drugs II:  -Group instruction provided by verbal instruction and written materials to support subject.  Instructor reviews cardiac drug classes: angiotensin converting enzyme inhibitors (ACE-I), angiotensin II receptor blockers (ARBs), nitrates, and calcium channel blockers.  Instructor discusses reasons, side effects, and lifestyle considerations for each drug class.   Anatomy and Physiology of the Circulatory System:  Group verbal and written instruction and models provide basic cardiac anatomy and physiology, with the coronary electrical and arterial systems. Review of: AMI, Angina, Valve disease, Heart Failure, Peripheral Artery Disease, Cardiac Arrhythmia, Pacemakers, and the ICD.   Other Education:  -Group or individual verbal, written, or video instructions that support the educational goals of the cardiac rehab program.   Holiday Eating Survival Tips:  -Group instruction provided by PowerPoint slides, verbal discussion, and written materials to support subject matter. The instructor gives patients tips, tricks, and techniques to help them not only survive but enjoy the holidays despite the onslaught of food that accompanies the holidays.   Knowledge Questionnaire Score:  Knowledge Questionnaire Score - 11/02/20 0833       Knowledge Questionnaire Score   Pre Score 16/24              Core Components/Risk Factors/Patient Goals at Admission:  Personal Goals and Risk Factors at Admission - 11/02/20 0833       Core Components/Risk Factors/Patient Goals on Admission    Weight Management Yes;Obesity;Weight Loss    Intervention Weight Management: Develop a combined nutrition and exercise program designed to reach desired caloric intake, while maintaining appropriate intake of nutrient and fiber, sodium and fats, and appropriate energy expenditure required for the weight goal.;Weight Management: Provide education and appropriate resources to help participant work on and attain dietary goals.;Weight Management/Obesity: Establish reasonable short term and long term weight goals.;Obesity: Provide education and appropriate resources to help participant work on and attain dietary goals.    Admit Weight 256 lb 2.8 oz (116.2 kg)    Expected Outcomes Short Term: Continue to assess and modify interventions until short term weight is achieved;Long Term: Adherence to nutrition and physical activity/exercise program aimed toward attainment of established weight goal;Weight Loss: Understanding of general recommendations for a balanced deficit meal plan, which promotes 1-2 lb weight loss per week and includes a negative energy balance of [phone] kcal/d;Understanding recommendations for meals to include 15-35% energy as protein, 25-35% energy from fat, 35-60% energy from carbohydrates, less than 200mg  of dietary cholesterol, 20-35 gm of total fiber daily;Understanding of distribution of calorie intake throughout the day with the consumption of 4-5 meals/snacks    Diabetes Yes    Intervention Provide education about signs/symptoms and action to take for hypo/hyperglycemia.;Provide education about proper nutrition, including hydration, and aerobic/resistive exercise prescription along with prescribed medications to achieve blood glucose in normal ranges: Fasting glucose 65-99 mg/dL    Expected  Outcomes Short Term: Participant verbalizes understanding of the signs/symptoms and immediate care of hyper/hypoglycemia, proper foot care and importance of medication, aerobic/resistive exercise and nutrition plan for blood glucose control.;Long Term: Attainment of HbA1C < 7%.    Heart Failure Yes    Intervention Provide a combined exercise and nutrition program that is supplemented with education, support and counseling about heart failure. Directed toward relieving symptoms such as shortness of breath, decreased exercise tolerance, and extremity edema.    Expected Outcomes Short term: Attendance in program 2-3  days a week with increased exercise capacity. Reported lower sodium intake. Reported increased fruit and vegetable intake. Reports medication compliance.;Short term: Daily weights obtained and reported for increase. Utilizing diuretic protocols set by physician.;Long term: Adoption of self-care skills and reduction of barriers for early signs and symptoms recognition and intervention leading to self-care maintenance.    Hypertension Yes    Intervention Provide education on lifestyle modifcations including regular physical activity/exercise, weight management, moderate sodium restriction and increased consumption of fresh fruit, vegetables, and low fat dairy, alcohol moderation, and smoking cessation.;Monitor prescription use compliance.    Expected Outcomes Short Term: Continued assessment and intervention until BP is < 140/21mm HG in hypertensive participants. < 130/43mm HG in hypertensive participants with diabetes, heart failure or chronic kidney disease.;Long Term: Maintenance of blood pressure at goal levels.    Lipids Yes    Intervention Provide education and support for participant on nutrition & aerobic/resistive exercise along with prescribed medications to achieve LDL 70mg , HDL >40mg .    Expected Outcomes Short Term: Participant states understanding of desired cholesterol values and is  compliant with medications prescribed. Participant is following exercise prescription and nutrition guidelines.;Long Term: Cholesterol controlled with medications as prescribed, with individualized exercise RX and with personalized nutrition plan. Value goals: LDL < 70mg , HDL > 40 mg.    Stress Yes    Intervention Offer individual and/or small group education and counseling on adjustment to heart disease, stress management and health-related lifestyle change. Teach and support self-help strategies.;Refer participants experiencing significant psychosocial distress to appropriate mental health specialists for further evaluation and treatment. When possible, include family members and significant others in education/counseling sessions.    Expected Outcomes Short Term: Participant demonstrates changes in health-related behavior, relaxation and other stress management skills, ability to obtain effective social support, and compliance with psychotropic medications if prescribed.;Long Term: Emotional wellbeing is indicated by absence of clinically significant psychosocial distress or social isolation.    Personal Goal Other Yes    Personal Goal Short term: walk longer without SOB and CP Long term: lose wt and gain endurance    Intervention Will continue to monitor pt and progress workloads as tolerated without sign or symptom    Expected Outcomes Pt will achieve her goals             Core Components/Risk Factors/Patient Goals Review:   Goals and Risk Factor Review     Row Name 11/13/20 1439             Core Components/Risk Factors/Patient Goals Review   Personal Goals Review Weight Management/Obesity;Hypertension;Lipids;Diabetes;Stress       Review Anyia started exercise at cardiac rehab on 11/09/20 and is deconditioned. VSS. CBG's Stable. Myleah reported having sciatica pain. Ellajane is looking forward to participating in the program.       Expected Outcomes Tashawnda will continue to  participate in phase 2 cardiac rehab for exercise, nutrition and lifestyle modifications.                Core Components/Risk Factors/Patient Goals at Discharge (Final Review):   Goals and Risk Factor Review - 11/13/20 1439       Core Components/Risk Factors/Patient Goals Review   Personal Goals Review Weight Management/Obesity;Hypertension;Lipids;Diabetes;Stress    Review Sumiya started exercise at cardiac rehab on 11/09/20 and is deconditioned. VSS. CBG's Stable. Jamerica reported having sciatica pain. Makinzy is looking forward to participating in the program.    Expected Outcomes Lidwina will continue to participate in phase 2 cardiac rehab for exercise,  nutrition and lifestyle modifications.             ITP Comments:  ITP Comments     Row Name 11/01/20 1543 11/13/20 1429         ITP Comments Dr Fransico Him MD, Medical Director 30 Day ITP Review. Traniyah started exercise at cardiac rehab on 11/09/20. Reported having sciatica pain and is deconditoned has prior hx of CVA with right sided weakness.               Comments: See ITP Comments

## 2020-11-15 LAB — GLUCOSE, CAPILLARY
Glucose-Capillary: 172 mg/dL — ABNORMAL HIGH (ref 70–99)
Glucose-Capillary: 109 mg/dL — ABNORMAL HIGH (ref 70–99)

## 2020-11-16 ENCOUNTER — Encounter: Payer: Self-pay | Admitting: Cardiology

## 2020-11-16 ENCOUNTER — Ambulatory Visit: Payer: Federal, State, Local not specified - PPO | Admitting: Cardiology

## 2020-11-16 ENCOUNTER — Other Ambulatory Visit: Payer: Self-pay

## 2020-11-16 ENCOUNTER — Encounter (HOSPITAL_COMMUNITY): Payer: Federal, State, Local not specified - PPO

## 2020-11-16 ENCOUNTER — Telehealth (HOSPITAL_COMMUNITY): Payer: Self-pay | Admitting: *Deleted

## 2020-11-16 VITALS — BP 159/85 | HR 90 | Temp 98.2°F | Resp 16 | Ht 66.0 in | Wt 261.8 lb

## 2020-11-16 DIAGNOSIS — I252 Old myocardial infarction: Secondary | ICD-10-CM

## 2020-11-16 DIAGNOSIS — I6930 Unspecified sequelae of cerebral infarction: Secondary | ICD-10-CM

## 2020-11-16 DIAGNOSIS — E1169 Type 2 diabetes mellitus with other specified complication: Secondary | ICD-10-CM | POA: Diagnosis not present

## 2020-11-16 DIAGNOSIS — I5189 Other ill-defined heart diseases: Secondary | ICD-10-CM

## 2020-11-16 DIAGNOSIS — Z794 Long term (current) use of insulin: Secondary | ICD-10-CM | POA: Diagnosis not present

## 2020-11-16 DIAGNOSIS — E1140 Type 2 diabetes mellitus with diabetic neuropathy, unspecified: Secondary | ICD-10-CM | POA: Diagnosis not present

## 2020-11-16 DIAGNOSIS — I10 Essential (primary) hypertension: Secondary | ICD-10-CM | POA: Diagnosis not present

## 2020-11-16 DIAGNOSIS — Z6841 Body Mass Index (BMI) 40.0 and over, adult: Secondary | ICD-10-CM

## 2020-11-16 DIAGNOSIS — E66813 Obesity, class 3: Secondary | ICD-10-CM

## 2020-11-16 DIAGNOSIS — I2510 Atherosclerotic heart disease of native coronary artery without angina pectoris: Secondary | ICD-10-CM | POA: Diagnosis not present

## 2020-11-16 DIAGNOSIS — Z955 Presence of coronary angioplasty implant and graft: Secondary | ICD-10-CM

## 2020-11-16 MED ORDER — HYDRALAZINE HCL 10 MG PO TABS: 10.0000 mg | ORAL_TABLET | Freq: Three times a day (TID) | ORAL | 0 refills | Status: DC

## 2020-11-16 NOTE — Progress Notes (Signed)
Date:  11/16/2020   ID:  Tina [name], DOB [birth year], MRN [id number]  PCP:  [name], DO  Cardiologist:  Rex Kras, DO, [facility] (established care 04/07/2019) Former cardiology providers: Jeri Lager, APRN, FNP-C  Date: 11/16/20 Last Office Visit: 09/13/2020   Chief Complaint  Patient presents with   Follow-up   Chest Pain   Coronary Artery Disease    HPI  Tina [name] is a [name] who presents to the office with a chief complaint of " heart disease management." Patient's past medical history and cardiovascular risk factors include: OSA on CPAP, hypertension, insulin-dependent diabetes mellitus, h/o stroke 2011, obesity, NSTEMI treated with OM2 PCI 09/05/2020, hyperlipidemia, obesity due to excess calories.  Patient is accompanied by her husband at today's office visit.  She was recently hospitalized in August 2022 with a chief complaint of chest pain.  Patient ruled in for non-STEMI and underwent invasive angiography and was noted to have obstructive CAD in the OM2 distribution.  She underwent PCI and since then has been on dual antiplatelet therapy.  Since then patient has done well from a cardiovascular standpoint.  She also started cardiac rehab couple weeks ago.  Around the time of her orientation cardiac rehab and call the office stating that she was having chest pain.  When asked she was not having any anginal discomfort and therefore recommended to continue cardiac rehab.  She is doing well in cardiac rehab and is actually up to 30 minutes of elliptical regularly.  In the past patient's blood pressure has also been difficult to control.  She has multiple allergies total of 16 as documented below.  Her home blood pressures are unknown and she does not check them.  However at cardiac rehab before starting exercises usually 138/70.  She is also noticed some weight gain since her recent discharge from the hospital.  She denies orthopnea, paroxysmal  nocturnal dyspnea.  She has minimal bilateral lower extremity swelling.  Patient has been not compliant with caloric intake despite having insulin-dependent diabetes and underlying obesity.  I suspect that majority of her weight is due to caloric intake.  FUNCTIONAL STATUS: She works out approximately 15 minutes at home.  ALLERGIES: Allergies  Allergen Reactions   Contrast Media [Iodinated Diagnostic Agents] Shortness Of Breath and Other (See Comments)    Difficulty breathing   Iodine Anaphylaxis   Iohexol Hives, Nausea And Vomiting, Swelling and Other (See Comments)     Desc: Magnevist-gadolinium-difficulty breathing, throat swelling    Midazolam Hcl Anaphylaxis    Difficulty breathing   Other Shortness Of Breath, Itching and Other (See Comments)    Patient is allergic to all nuts except peanuts, which are NOT "nuts"- they are legumes   Shellfish Allergy Anaphylaxis   Valsartan Swelling   Metformin And Related Diarrhea, Nausea And Vomiting and Other (See Comments)    Dehydration, also   [place] [Dapagliflozin] Nausea Only and Other (See Comments)    UTI, headaches and muscle aches, also   Hydralazine Other (See Comments)    Reaction not recalled   Saxagliptin Hives, Nausea And Vomiting and Other (See Comments)    ONGLYZA- dehydration, also   Spironolactone Other (See Comments)    Elevated potassium- Severe hyperkalemia   Avandia [Rosiglitazone Maleate] Hives and Other (See Comments)   Geodon [Ziprasidone] Other (See Comments)    Reaction not recalled   Kiwi Extract Itching and Swelling   Latex Itching    MEDICATION LIST PRIOR TO VISIT: Current Meds  Medication Sig   albuterol (VENTOLIN HFA) 108 (90 Base) MCG/ACT inhaler Inhale 2 puffs into the lungs every 4 (four) hours as needed for wheezing or shortness of breath.   allopurinol (ZYLOPRIM) 100 MG tablet Take 100 mg by mouth at bedtime.   amLODipine (NORVASC) 5 MG tablet Take 1 tablet (5 mg total) by mouth daily.   aspirin  EC 81 MG tablet Take 81 mg by mouth at bedtime. Swallow whole.   atorvastatin (LIPITOR) 40 MG tablet Take 1 tablet (40 mg total) by mouth at bedtime.   carvedilol (COREG) 25 MG tablet Take 25 mg by mouth 2 (two) times daily.   cetirizine (ZYRTEC) 10 MG tablet Take 10 mg by mouth daily as needed for allergies (itching).   cloNIDine (CATAPRES) 0.2 MG tablet Take 0.4 mg by mouth 2 (two) times daily.   clopidogrel (PLAVIX) 75 MG tablet Take 1 tablet by mouth once daily   Continuous Blood Gluc Sensor (FREESTYLE LIBRE 2 SENSOR) MISC Inject 1 Device into the skin every 14 (fourteen) days.   DULoxetine (CYMBALTA) 20 MG capsule Take 20-40 mg by mouth See admin instructions. Take 20 mg by mouth in the morning and 40 mg at bedtime   EPINEPHrine (AUVI-Q) 0.3 mg/0.3 mL IJ SOAJ injection Inject 0.3 mg into the muscle as needed for anaphylaxis.   famotidine (PEPCID) 20 MG tablet Take 1 tablet (20 mg total) by mouth 2 (two) times daily.   furosemide (LASIX) 40 MG tablet Take 40 mg by mouth in the morning.   hydrALAZINE (APRESOLINE) 10 MG tablet Take 1 tablet (10 mg total) by mouth 3 (three) times daily.   Insulin Aspart FlexPen (NOVOLOG) 100 UNIT/ML Inject 0-25 Units into the skin 3 (three) times daily before meals. Dose per sliding scale.   Insulin Glargine (BASAGLAR KWIKPEN) 100 UNIT/ML Inject 22 Units into the skin at bedtime. (Patient taking differently: Inject 18 Units into the skin at bedtime.)   isosorbide mononitrate (IMDUR) 60 MG 24 hr tablet Take 1 tablet (60 mg total) by mouth daily.   Semaglutide, 1 MG/DOSE, (OZEMPIC, 1 MG/DOSE,) 2 MG/1.5ML SOPN Inject 1 mg into the skin every Friday.   tiZANidine (ZANAFLEX) 2 MG tablet Take 1 tablet (2 mg total) by mouth at bedtime.   traMADol (ULTRAM) 50 MG tablet Take 1 tablet by mouth twice daily     PAST MEDICAL HISTORY: Past Medical History:  Diagnosis Date   Allergy    Anemia    DURING MENSES--HAS HEAVY BLEEDING WITH PERODS   Angioedema 08/13/2020    Anxiety    Back pain, chronic    "ongoing"   Blood transfusion    IN 2012  AFTER C -SECTION   Cerebral thrombosis with cerebral infarction ([place]) 06/2009   RIGHT SIDED WEAKNESS ( ARM AND LEG ) AND SPASMS-remains with slight weakness and vertigo.   Constipation    Depression    Diabetes mellitus    Diabetic neuropathy (HCC)    BOTH FEET --COMES AND GOES   Edema, lower extremity    Endometriosis    Fatty liver    GERD (gastroesophageal reflux disease)    with pregnancy   H/O eye surgery    Headache(784.0)    MIGRAINES--NOT REALLY HEADACHE-MORE LIKE PRESSURE SENSATION IN HEAD-FEELS DIZZIY AND  FAINT AS THE PRESSURE RESOLVES   Hernia, incisional, RLQ, s/p lap repair Sep 2013 09/02/2011   History of vertigo 03/22/2018   Hx of migraines 10/19/2011   Hyperlipidemia    Hypertension    Leg  pain, right    "like bad Charley horse"   Multiple food allergies    Panic disorder without agoraphobia    Rash    HANDS, ARMS --STATES HX OF RASH [place].  STATES THE RASH OFTEN OCCURS WHEN SHE IS REALLY STRESSED."goes and comes-presently left ring finger"   Restless leg syndrome    DIAGNOSED BY SLEEP STUDY - PT TOLD SHE DID NOT HAVE SLEEP APNEA   Right rotator cuff tear    PAIN IN RIGHT SHOULDER   SBO (small bowel obstruction) ([place]) 06/03/2012   Shortness of breath    Spastic hemiplegia affecting dominant side (HCC)    Stomach problems    Stroke (HCC)    Ventral hernia    RIGHT LOWER QUADRANT-CAUSING SOME PAIN   Weakness of right side of body     PAST SURGICAL HISTORY: Past Surgical History:  Procedure Laterality Date   APPLICATION OF WOUND VAC N/A 05/25/2015   Procedure: APPLICATION OF WOUND VAC;  Surgeon: Michael Boston, MD;  Location: [facility];  Service: General;  Laterality: N/A;   CARDIAC CATHETERIZATION     CESAREAN SECTION  2012   COLONOSCOPY     CORONARY STENT INTERVENTION N/A 09/05/2020   Procedure: CORONARY STENT INTERVENTION;  Surgeon: Nigel Mormon, MD;  Location: [place] [facility];  Service: Cardiovascular;  Laterality: N/A;   DIAGNOSTIC LAPAROSCOPY     ESOPHAGOGASTRODUODENOSCOPY N/A 06/03/2012   Procedure: ESOPHAGOGASTRODUODENOSCOPY (EGD);  Surgeon: [name] Craver, MD;  Location: [facility];  Service: Endoscopy;  Laterality: N/A;   EXCISION MASS ABDOMINAL N/A 05/25/2015   Procedure: ABDOMINAL WALL EXPLORATION EXCISION OF SEROMA REMOVAL OF REDUNDANT SKIN ;  Surgeon: Michael Boston, MD;  Location: [facility];  Service: General;  Laterality: N/A;   EYE SURGERY     Eye laser for vessel hemorrhaging   fybroid removal     HERNIA REPAIR  10/03/2011   ventral hernia repair   INSERTION OF MESH N/A 02/04/2013   Procedure: INSERTION OF MESH;  Surgeon: Adin Hector, MD;  Location: [facility];  Service: General;  Laterality: N/A;   INTRAVASCULAR ULTRASOUND/IVUS N/A 09/05/2020   Procedure: Intravascular Ultrasound/IVUS;  Surgeon: Nigel Mormon, MD;  Location: [place] [facility];  Service: Cardiovascular;  Laterality: N/A;   LEFT HEART CATH AND CORONARY ANGIOGRAPHY N/A 09/05/2020   Procedure: LEFT HEART CATH AND CORONARY ANGIOGRAPHY;  Surgeon: Nigel Mormon, MD;  Location: [place] [facility];  Service: Cardiovascular;  Laterality: N/A;   UMBILICAL HERNIA REPAIR N/A 02/04/2013   Procedure: LAPAROSCOPIC ventral wall hernia repair LAPAROSCOPIC LYSIS OF ADHESIONS laparoscopic exploration of abdomen ;  Surgeon: Adin Hector, MD;  Location: [facility];  Service: General;  Laterality: N/A;   UPPER GASTROINTESTINAL ENDOSCOPY     URETER REVISION     Bilateral "twisted"   UTERINE FIBROID SURGERY     2 SURGERIES FOR FIBROIDS   VENTRAL HERNIA REPAIR  10/03/2011   Procedure: LAPAROSCOPIC VENTRAL HERNIA;  Surgeon: Adin Hector, MD;  Location: [facility];  Service: General;  Laterality: N/A;    FAMILY HISTORY: The patient family history includes Alcoholism in her mother; Allergic rhinitis in her mother; Anxiety disorder in her mother; Bipolar disorder  in her mother; Colon cancer in her paternal uncle; Depression in her father and mother; Diabetes in her father and maternal grandmother; Drug abuse in her father and mother; Eating disorder in her mother; Eczema in her mother and sister; Hyperlipidemia in her paternal grandmother; Kidney disease in her  father; Stroke in her paternal grandmother; Urticaria in her mother and sister.  SOCIAL HISTORY:  The patient  reports that she has never smoked. She has never used smokeless tobacco. She reports that she does not drink alcohol and does not use drugs.  REVIEW OF SYSTEMS: Review of Systems  Constitutional: Positive for weight gain. Negative for chills and fever.  HENT:  Negative for hoarse voice and nosebleeds.   Eyes:  Negative for discharge, double vision and pain.  Cardiovascular:  Positive for dyspnea on exertion (chronic). Negative for chest pain, claudication, leg swelling, near-syncope, orthopnea, palpitations, paroxysmal nocturnal dyspnea and syncope.  Respiratory:  Positive for shortness of breath (Chronic). Negative for hemoptysis.   Musculoskeletal:  Negative for muscle cramps and myalgias.  Gastrointestinal:  Negative for abdominal pain, constipation, diarrhea, hematemesis, hematochezia, melena, nausea and vomiting.  Neurological:  Negative for dizziness and light-headedness.   PHYSICAL EXAM: Vitals with BMI 11/16/2020 11/01/2020 10/30/2020  Height _0  5' 6.75" _1   Weight 261 lbs 13 oz 256 lbs 3 oz 252 lbs  BMI 42.28 54.00 86.76  Systolic 195 093 267  Diastolic 85 80 86  Pulse 90 82 86    CONSTITUTIONAL: Well-developed and well-nourished. No acute distress.  SKIN: Skin is warm and dry. No rash noted. No cyanosis. No pallor. No jaundice HEAD: Normocephalic and atraumatic.  EYES: No scleral icterus MOUTH/THROAT: Moist oral membranes.  NECK: No JVD present. No thyromegaly noted. No carotid bruits  LYMPHATIC: No visible cervical adenopathy.  CHEST Normal respiratory effort.  No intercostal retractions  LUNGS: Clear to auscultation bilaterally.  No stridor. No wheezes. No rales.  CARDIOVASCULAR: Regular rate and rhythm, positive S1-S2, no murmurs rubs or gallops appreciated. ABDOMINAL: Obese, soft, nontender, nondistended, positive bowel sounds in all 4 quadrants, no apparent ascites.  EXTREMITIES: No peripheral edema, warm to touch. HEMATOLOGIC: No significant bruising NEUROLOGIC: Oriented to person, place, and time. Nonfocal. Normal muscle tone.  PSYCHIATRIC: Normal mood and affect. Normal behavior. Cooperative  CARDIAC DATABASE: EKG: 09/05/2020 1128: Normal sinus rhythm, 73 bpm, without underlying ischemia or injury pattern.  11/01/2020: NSR, 77bpm, no injury pattern, non-specific T wave abnormalities.   Echocardiogram: 09/06/2020:  1. Left ventricular ejection fraction, by estimation, is 60 to 65%. The left ventricle has normal function. The left ventricle has no regional wall motion abnormalities. There is mild left ventricular hypertrophy. Left ventricular diastolic parameters  are indeterminate. Elevated left atrial pressure.   2. Right ventricular systolic function is normal. The right ventricular size is normal.   3. The mitral valve is grossly normal. No evidence of mitral valve regurgitation. No evidence of mitral stenosis.   4. The aortic valve is tricuspid. Aortic valve regurgitation is not visualized. Mild aortic valve sclerosis is present, with no evidence of aortic valve stenosis.   5. The inferior vena cava is normal in size with greater than 50% respiratory variability, suggesting right atrial pressure of 3 mmHg.   Stress Testing: Lexiscan myoview stress test 08/21/2017:  1. Lexiscan stress test was performed. Exercise capacity was not assessed. Resting BP 148/90 mmHg, peak effect BP 168/90 mmHg. Stress symptoms included dizziness, nausea, headache, chest tightness.  2. The overall quality of the study is good. There is no evidence of abnormal  lung activity. Stress and rest SPECT images demonstrate homogeneous tracer distribution throughout the myocardium. Gated SPECT imaging reveals normal myocardial thickening and wall motion. The left ventricular ejection fraction was normal calculated as 45%, although visually appears normal.  3. Low risk study.  Heart Catheterization: Coronary intervention 09/05/2020: LM: Normal LAD: Distal apical 70% stenosis (non-culprit, very small caliber) Lcx: Mid Lcx 30% disease with just after a small OM1 branch (non-culprit)        Prox OM2 with 95% stenosis (culprit) RCA: Small caliber PDA with diffuse 60% disease LVEF 50-55% with apical inferolateral hypokinesis   Successful percutaneous coronary intervention OM2        PTCA and stent placement 2.25 X 12 mm Onyx drug-eluting stent        IVUS guided Post dilatation using 2.25 mm [place] at 22 atm and 2.5 mm [place] at 16 atm  ABI 08/21/2017: This exam reveals normal perfusion of both the  lower extremity (RABI 1.27 and LABI 1.20 with biphasic waveform).  ABI may be falsely elevated in patients with DM and medial calcinosis.  LABORATORY DATA: CBC Latest Ref Rng & Units 09/11/2020 09/10/2020 09/09/2020  WBC 4.0 - 10.5 K/uL 5.7 7.2 7.6  Hemoglobin 12.0 - 15.0 g/dL 11.2(L) 12.6 12.9  Hematocrit 36.0 - 46.0 % 34.3(L) 40.6 39.7  Platelets 150 - 400 K/uL 299 348 364    CMP Latest Ref Rng & Units 09/11/2020 09/10/2020 09/09/2020  Glucose 70 - 99 mg/dL 190(H) 120(H) 111(H)  BUN 6 - 20 mg/dL _0 Creatinine 0.44 - 1.00 mg/dL 1.26(H) 1.02(H) 1.11(H)  Sodium 135 - 145 mmol/L 136 139 138  Potassium 3.5 - 5.1 mmol/L 3.9 3.4(L) 3.3(L)  Chloride 98 - 111 mmol/L 101 103 102  CO2 22 - 32 mmol/L _1 Calcium 8.9 - 10.3 mg/dL 8.5(L) 8.3(L) 8.9  Total Protein 6.5 - 8.1 g/dL - - 6.6  Total Bilirubin 0.3 - 1.2 mg/dL - - 0.7  Alkaline Phos 38 - 126 U/L - - 57  AST 15 - 41 U/L - - 17  ALT 0 - 44 U/L - - 16    Lipid Panel  Lab Results  Component Value Date    CHOL 175 09/06/2020   HDL 56 09/06/2020   LDLCALC 92 09/06/2020   TRIG 137 09/06/2020   CHOLHDL 3.1 09/06/2020     No components found for: NTPROBNP No results for input(s): PROBNP in the last 8760 hours. No results for input(s): TSH in the last 8760 hours.  BMP Recent Labs    09/09/20 1840 09/10/20 0238 09/11/20 0020  NA 138 139 136  K 3.3* 3.4* 3.9  CL 102 103 101  CO2 _2 GLUCOSE 111* 120* 190*  BUN _3 CREATININE 1.11* 1.02* 1.26*  CALCIUM 8.9 8.3* 8.5*  GFRNONAA >60 >60 52*    HEMOGLOBIN A1C Lab Results  Component Value Date   HGBA1C 8.4 (H) 08/13/2020   MPG 194.38 08/13/2020    IMPRESSION:    ICD-10-CM   1. Atherosclerosis of native coronary artery of native heart without angina pectoris  I25.10     2. History of non-ST elevation myocardial infarction (NSTEMI)  I25.2     3. Hx of heart artery stent  Z95.5     4. Diastolic dysfunction without heart failure  I51.89 hydrALAZINE (APRESOLINE) 10 MG tablet    Basic metabolic panel    Magnesium    Pro b natriuretic peptide (BNP)    5. Essential hypertension  I10 hydrALAZINE (APRESOLINE) 10 MG tablet    6. History of CVA with residual deficit  I69.30     7. Type 2 diabetes mellitus with diabetic neuropathy, with long-term current use of insulin (HCC)  E11.40  Z79.4     8. Long-term insulin use (HCC)  Z79.4     9. Type 2 diabetes mellitus with other specified complication, with long-term current use of insulin (HCC)  E11.69    Z79.4     10. Class 3 severe obesity due to excess calories with serious comorbidity and body mass index (BMI) of 40.0 to 44.9 in adult [facility])  E66.01    Z68.41        RECOMMENDATIONS: Tina [name] is a [name] whose past medical history and cardiac risk factors include: OSA on CPAP, hypertension, insulin-dependent diabetes mellitus, h/o stroke 2011, obesity, NSTEMI treated with OM2 PCI 09/05/2020, hyperlipidemia, obesity due to excess  calories.  Atherosclerosis of native coronary artery of native heart without angina pectoris, history of PCI, history of NSTEMI 08/2020: Free of angina pectoris. Status post PCI to OM 2. Dual antiplatelet therapy to continue until 09/01/2021 and longer if able due to the size the stent (per interventional cardiology).  Continue current medical therapy. Continue cardiac rehab. Reemphasized the importance of improving her modifiable cardiovascular risk factors including glycemic control, lipid management, triglycerides, weight loss, and blood pressure management.  Diastolic dysfunction without heart failure No prior history of congestive heart failure hospitalization. Educated on improving her modifiable cardiovascular risk factors as outlined above. Her care has been challenging due to multiple allergies/intolerances (valsartan: Swelling/angioedema?,  Spironolactone hyperkalemia, hydralazine-unable to recall the side effect) Patient's blood pressure is not well controlled at today's office visit. Patient is willing to initiate hydralazine 10 mg p.o. 3 times daily with closer observation for side checks. Medications reconciled. Labs in 1 week to evaluate renal function  Essential hypertension See above Patient is encouraged to invest in a blood pressure cuff. Educated on the importance of decreasing caloric intake and salt on a daily basis. Will continue to monitor  History of CVA with residual deficit Educated importance of secondary prevention. Medications reconciled.  Type 2 diabetes mellitus with diabetic neuropathy, with long-term current use of insulin (HCC) Currently not on ACE inhibitor/ARB due to swelling?  Angioedema. Was on Farxiga which is now listed as one of her allergies. Continue statin therapy. Patient's blood sugars have not been well controlled as per the results forwarded by cardiac rehab.  I have asked her to reach out to her provider who is managing her diabetes and  consider up titration of therapy.  Hyperlipidemia: Most recent LDL 92 mg/dL. Recommend a goal LDL of less than 70 mg/dL in the setting of diabetes, prior stroke, and CAD. Currently on atorvastatin. Most recent lipid profile reviewed and noted above.  Class 3 severe obesity due to excess calories with serious comorbidity and body mass index (BMI) of 40.0 to 44.9 in adult [facility]) Body mass index is 42.26 kg/m. I reviewed with the patient the importance of diet, regular physical activity/exercise, weight loss.   Patient is educated on increasing physical activity gradually as tolerated.  With the goal of moderate intensity exercise for 30 minutes a day 5 days a week.  FINAL MEDICATION LIST END OF ENCOUNTER: Meds ordered this encounter  Medications   hydrALAZINE (APRESOLINE) 10 MG tablet    Sig: Take 1 tablet (10 mg total) by mouth 3 (three) times daily.    Dispense:  90 tablet    Refill:  0    Medications Discontinued During This Encounter  Medication Reason   gabapentin (NEURONTIN) 300 MG capsule Error   metoCLOPramide (REGLAN) 10 MG tablet Error   omeprazole (PRILOSEC)  40 MG capsule Error     Current Outpatient Medications:    albuterol (VENTOLIN HFA) 108 (90 Base) MCG/ACT inhaler, Inhale 2 puffs into the lungs every 4 (four) hours as needed for wheezing or shortness of breath., Disp: 18 g, Rfl: 1   allopurinol (ZYLOPRIM) 100 MG tablet, Take 100 mg by mouth at bedtime., Disp: , Rfl:    amLODipine (NORVASC) 5 MG tablet, Take 1 tablet (5 mg total) by mouth daily., Disp: 90 tablet, Rfl: 0   aspirin EC 81 MG tablet, Take 81 mg by mouth at bedtime. Swallow whole., Disp: , Rfl:    atorvastatin (LIPITOR) 40 MG tablet, Take 1 tablet (40 mg total) by mouth at bedtime., Disp: 90 tablet, Rfl: 0   carvedilol (COREG) 25 MG tablet, Take 25 mg by mouth 2 (two) times daily., Disp: , Rfl:    cetirizine (ZYRTEC) 10 MG tablet, Take 10 mg by mouth daily as needed for allergies (itching)., Disp: , Rfl:     cloNIDine (CATAPRES) 0.2 MG tablet, Take 0.4 mg by mouth 2 (two) times daily., Disp: , Rfl:    clopidogrel (PLAVIX) 75 MG tablet, Take 1 tablet by mouth once daily, Disp: 30 tablet, Rfl: 0   Continuous Blood Gluc Sensor (FREESTYLE LIBRE 2 SENSOR) MISC, Inject 1 Device into the skin every 14 (fourteen) days., Disp: , Rfl:    DULoxetine (CYMBALTA) 20 MG capsule, Take 20-40 mg by mouth See admin instructions. Take 20 mg by mouth in the morning and 40 mg at bedtime, Disp: , Rfl:    EPINEPHrine (AUVI-Q) 0.3 mg/0.3 mL IJ SOAJ injection, Inject 0.3 mg into the muscle as needed for anaphylaxis., Disp: 1 each, Rfl: 1   famotidine (PEPCID) 20 MG tablet, Take 1 tablet (20 mg total) by mouth 2 (two) times daily., Disp: 60 tablet, Rfl: 5   furosemide (LASIX) 40 MG tablet, Take 40 mg by mouth in the morning., Disp: , Rfl:    hydrALAZINE (APRESOLINE) 10 MG tablet, Take 1 tablet (10 mg total) by mouth 3 (three) times daily., Disp: 90 tablet, Rfl: 0   Insulin Aspart FlexPen (NOVOLOG) 100 UNIT/ML, Inject 0-25 Units into the skin 3 (three) times daily before meals. Dose per sliding scale., Disp: , Rfl:    Insulin Glargine (BASAGLAR KWIKPEN) 100 UNIT/ML, Inject 22 Units into the skin at bedtime. (Patient taking differently: Inject 18 Units into the skin at bedtime.), Disp: , Rfl:    isosorbide mononitrate (IMDUR) 60 MG 24 hr tablet, Take 1 tablet (60 mg total) by mouth daily., Disp: 30 tablet, Rfl: 3   Semaglutide, 1 MG/DOSE, (OZEMPIC, 1 MG/DOSE,) 2 MG/1.5ML SOPN, Inject 1 mg into the skin every Friday., Disp: , Rfl:    tiZANidine (ZANAFLEX) 2 MG tablet, Take 1 tablet (2 mg total) by mouth at bedtime., Disp: 30 tablet, Rfl: 2   traMADol (ULTRAM) 50 MG tablet, Take 1 tablet by mouth twice daily, Disp: 60 tablet, Rfl: 0  Orders Placed This Encounter  Procedures   Basic metabolic panel   Magnesium   Pro b natriuretic peptide (BNP)    There are no Patient Instructions on file for this visit.   --Continue cardiac  medications as reconciled in final medication list. --Return in about 3 months (around 02/16/2021) for Follow up, CAD. Or sooner if needed. --Continue follow-up with your primary care physician regarding the management of your other chronic comorbid conditions.  Patient's questions and concerns were addressed to her satisfaction. She voices understanding of the instructions provided during  this encounter.   This note was created using a voice recognition software as a result there may be grammatical errors inadvertently enclosed that do not reflect the nature of this encounter. Every attempt is made to correct such errors.  Rex Kras, [place], [facility]  Pager: [phone] Office: [phone]

## 2020-11-16 NOTE — Telephone Encounter (Signed)
Talasia called to report that she will be out due to sciatica pain. Kenyia also says that she saw Dr Terri Skains today and was placed on a diuretic for fluid retention. Akeela hopes to return to exercise on Monday.[name], RN,BSN 11/16/2020 3:27 PM

## 2020-11-16 NOTE — Telephone Encounter (Signed)
Discussed this during today's office visit.

## 2020-11-19 ENCOUNTER — Other Ambulatory Visit: Payer: Self-pay

## 2020-11-19 ENCOUNTER — Other Ambulatory Visit: Payer: Self-pay | Admitting: Cardiology

## 2020-11-19 ENCOUNTER — Encounter (HOSPITAL_COMMUNITY)
Admission: RE | Admit: 2020-11-19 | Discharge: 2020-11-19 | Disposition: A | Discharge status: Home / Self Care | Payer: Federal, State, Local not specified - PPO | Source: Ambulatory Visit | Attending: Cardiology | Admitting: Cardiology

## 2020-11-19 DIAGNOSIS — I5189 Other ill-defined heart diseases: Secondary | ICD-10-CM | POA: Diagnosis not present

## 2020-11-19 DIAGNOSIS — I214 Non-ST elevation (NSTEMI) myocardial infarction: Secondary | ICD-10-CM

## 2020-11-19 DIAGNOSIS — Z955 Presence of coronary angioplasty implant and graft: Secondary | ICD-10-CM | POA: Diagnosis not present

## 2020-11-19 NOTE — Progress Notes (Signed)
Medication changes noted. Systolic BP's in the 741-423'T today. Erlean said that she will be restarting gabapentin for neuropathy. Will continue to monitor BP.Harrell Gave RN BSN

## 2020-11-20 LAB — BASIC METABOLIC PANEL
BUN/Creatinine Ratio: 18 (ref 9–23)
BUN: 19 mg/dL (ref 6–24)
CO2: 27 mmol/L (ref 20–29)
Calcium: 9 mg/dL (ref 8.7–10.2)
Chloride: 102 mmol/L (ref 96–106)
Creatinine, Ser: 1.07 mg/dL — ABNORMAL HIGH (ref 0.57–1.00)
Glucose: 200 mg/dL — ABNORMAL HIGH (ref 70–99)
Potassium: 4.2 mmol/L (ref 3.5–5.2)
Sodium: 140 mmol/L (ref 134–144)
eGFR: 63 mL/min/{1.73_m2} (ref >59)

## 2020-11-20 LAB — PRO B NATRIURETIC PEPTIDE: NT-Pro BNP: 228 pg/mL (ref 0–249)

## 2020-11-20 LAB — MAGNESIUM: Magnesium: 1.8 mg/dL (ref 1.6–2.3)

## 2020-11-21 ENCOUNTER — Other Ambulatory Visit: Payer: Self-pay

## 2020-11-21 ENCOUNTER — Encounter (HOSPITAL_COMMUNITY)
Admission: RE | Admit: 2020-11-21 | Discharge: 2020-11-21 | Disposition: A | Discharge status: Home / Self Care | Payer: Federal, State, Local not specified - PPO | Source: Ambulatory Visit | Attending: Cardiology | Admitting: Cardiology

## 2020-11-21 DIAGNOSIS — I10 Essential (primary) hypertension: Secondary | ICD-10-CM | POA: Diagnosis not present

## 2020-11-21 DIAGNOSIS — I214 Non-ST elevation (NSTEMI) myocardial infarction: Secondary | ICD-10-CM

## 2020-11-21 DIAGNOSIS — Z955 Presence of coronary angioplasty implant and graft: Secondary | ICD-10-CM | POA: Diagnosis not present

## 2020-11-21 DIAGNOSIS — E7800 Pure hypercholesterolemia, unspecified: Secondary | ICD-10-CM | POA: Diagnosis not present

## 2020-11-21 DIAGNOSIS — E1140 Type 2 diabetes mellitus with diabetic neuropathy, unspecified: Secondary | ICD-10-CM | POA: Diagnosis not present

## 2020-11-21 DIAGNOSIS — Z794 Long term (current) use of insulin: Secondary | ICD-10-CM | POA: Diagnosis not present

## 2020-11-21 DIAGNOSIS — E118 Type 2 diabetes mellitus with unspecified complications: Secondary | ICD-10-CM | POA: Diagnosis not present

## 2020-11-21 DIAGNOSIS — E559 Vitamin D deficiency, unspecified: Secondary | ICD-10-CM | POA: Diagnosis not present

## 2020-11-21 DIAGNOSIS — I639 Cerebral infarction, unspecified: Secondary | ICD-10-CM | POA: Diagnosis not present

## 2020-11-21 DIAGNOSIS — Z6838 Body mass index (BMI) 38.0-38.9, adult: Secondary | ICD-10-CM | POA: Diagnosis not present

## 2020-11-21 DIAGNOSIS — E1169 Type 2 diabetes mellitus with other specified complication: Secondary | ICD-10-CM | POA: Diagnosis not present

## 2020-11-21 LAB — GLUCOSE, CAPILLARY: Glucose-Capillary: 238 mg/dL — ABNORMAL HIGH (ref 70–99)

## 2020-11-23 ENCOUNTER — Encounter (HOSPITAL_COMMUNITY): Payer: Federal, State, Local not specified - PPO

## 2020-11-23 NOTE — Progress Notes (Signed)
Spoke to patient she is aware

## 2020-11-26 ENCOUNTER — Encounter (HOSPITAL_COMMUNITY)
Admission: RE | Admit: 2020-11-26 | Discharge: 2020-11-26 | Disposition: A | Discharge status: Home / Self Care | Payer: Federal, State, Local not specified - PPO | Source: Ambulatory Visit | Attending: Cardiology | Admitting: Cardiology

## 2020-11-26 ENCOUNTER — Encounter (HOSPITAL_COMMUNITY): Payer: Federal, State, Local not specified - PPO

## 2020-11-26 ENCOUNTER — Other Ambulatory Visit: Payer: Self-pay

## 2020-11-26 DIAGNOSIS — I10 Essential (primary) hypertension: Secondary | ICD-10-CM | POA: Diagnosis not present

## 2020-11-26 DIAGNOSIS — Z794 Long term (current) use of insulin: Secondary | ICD-10-CM | POA: Diagnosis not present

## 2020-11-26 DIAGNOSIS — E559 Vitamin D deficiency, unspecified: Secondary | ICD-10-CM | POA: Diagnosis not present

## 2020-11-26 DIAGNOSIS — I639 Cerebral infarction, unspecified: Secondary | ICD-10-CM | POA: Diagnosis not present

## 2020-11-26 DIAGNOSIS — I214 Non-ST elevation (NSTEMI) myocardial infarction: Secondary | ICD-10-CM

## 2020-11-26 DIAGNOSIS — E7800 Pure hypercholesterolemia, unspecified: Secondary | ICD-10-CM | POA: Diagnosis not present

## 2020-11-26 DIAGNOSIS — Z955 Presence of coronary angioplasty implant and graft: Secondary | ICD-10-CM | POA: Diagnosis not present

## 2020-11-26 DIAGNOSIS — E1169 Type 2 diabetes mellitus with other specified complication: Secondary | ICD-10-CM | POA: Diagnosis not present

## 2020-11-26 DIAGNOSIS — E1140 Type 2 diabetes mellitus with diabetic neuropathy, unspecified: Secondary | ICD-10-CM | POA: Diagnosis not present

## 2020-11-26 DIAGNOSIS — Z6838 Body mass index (BMI) 38.0-38.9, adult: Secondary | ICD-10-CM | POA: Diagnosis not present

## 2020-11-26 LAB — GLUCOSE, CAPILLARY: Glucose-Capillary: 352 mg/dL — ABNORMAL HIGH (ref 70–99)

## 2020-11-26 NOTE — Progress Notes (Signed)
Incomplete Session Note  Patient Details  Name: Tina [name] MRN: [id number] Date of Birth: September 15, 1970 Referring Provider:   Flowsheet Row CARDIAC REHAB PHASE II ORIENTATION from 11/01/2020 in [facility]  Referring Provider Dr. Rex Kras, DO       Cliffie Carmelia Roller did not complete her rehab session.  CBG 352. No exercise per protocol. Patient said that her CBG was 300 2 hours ago she walked on the treadmill for 30 minutes. Upon review of medications Dyirest says that she was not able to get her Ozempic refilled and has not taken her weekly dose yet. Dyirest said that she took 10 units of Novolog as she has not eaten today. Patient's endocrinologist Dr Averneni's office was called and notified. Spoke with [employer]. Enis Gash spoke with the patient over the phone. Cyndy was given an appointment to speak with Dr Garnet Koyanagi over the phone today at 3:45. Reviewed diabetic parameters for exercise again with the patient and asked her not to report to exercise if her CBG is greater than 300 or less than 110. Patient states understanding and hopes to return to exercise on Wednesday. Patient was given water to drink.[name], RN,BSN 11/26/2020 3:37 PM

## 2020-11-28 ENCOUNTER — Encounter (HOSPITAL_COMMUNITY): Payer: Federal, State, Local not specified - PPO

## 2020-11-28 ENCOUNTER — Other Ambulatory Visit: Payer: Self-pay

## 2020-11-28 DIAGNOSIS — I5189 Other ill-defined heart diseases: Secondary | ICD-10-CM

## 2020-11-29 DIAGNOSIS — R300 Dysuria: Secondary | ICD-10-CM | POA: Diagnosis not present

## 2020-11-29 DIAGNOSIS — N390 Urinary tract infection, site not specified: Secondary | ICD-10-CM | POA: Diagnosis not present

## 2020-11-30 ENCOUNTER — Telehealth (HOSPITAL_COMMUNITY): Payer: Self-pay | Admitting: Family Medicine

## 2020-11-30 ENCOUNTER — Encounter (HOSPITAL_COMMUNITY): Payer: Federal, State, Local not specified - PPO

## 2020-11-30 ENCOUNTER — Telehealth (HOSPITAL_COMMUNITY): Payer: Self-pay | Admitting: *Deleted

## 2020-11-30 NOTE — Telephone Encounter (Signed)
Spoke with Tina [name] who says that  she has a UTI. Tina [name] has not picked up her antibiotic yet but plans to this afternoon. I advised that Tina [name] not exercise today. Tina [name] hopes that she plans to return to exercise at cardiac rehab next week providing her blood sugars are better controlled.[name], RN,BSN 11/30/2020 11:51 AM

## 2020-12-02 DIAGNOSIS — G4733 Obstructive sleep apnea (adult) (pediatric): Secondary | ICD-10-CM | POA: Diagnosis not present

## 2020-12-03 ENCOUNTER — Encounter (HOSPITAL_COMMUNITY)
Admission: RE | Admit: 2020-12-03 | Discharge: 2020-12-03 | Disposition: A | Discharge status: Home / Self Care | Payer: Federal, State, Local not specified - PPO | Source: Ambulatory Visit | Attending: Cardiology | Admitting: Cardiology

## 2020-12-03 ENCOUNTER — Encounter (HOSPITAL_COMMUNITY): Payer: Federal, State, Local not specified - PPO

## 2020-12-03 ENCOUNTER — Other Ambulatory Visit: Payer: Self-pay

## 2020-12-03 DIAGNOSIS — E1122 Type 2 diabetes mellitus with diabetic chronic kidney disease: Secondary | ICD-10-CM | POA: Diagnosis not present

## 2020-12-03 DIAGNOSIS — E1169 Type 2 diabetes mellitus with other specified complication: Secondary | ICD-10-CM | POA: Diagnosis not present

## 2020-12-03 DIAGNOSIS — E1165 Type 2 diabetes mellitus with hyperglycemia: Secondary | ICD-10-CM | POA: Diagnosis not present

## 2020-12-03 DIAGNOSIS — I214 Non-ST elevation (NSTEMI) myocardial infarction: Secondary | ICD-10-CM | POA: Diagnosis not present

## 2020-12-03 DIAGNOSIS — Z955 Presence of coronary angioplasty implant and graft: Secondary | ICD-10-CM | POA: Diagnosis not present

## 2020-12-03 NOTE — Progress Notes (Signed)
Tina [name] returned to exercise today. Tina [name]. 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 Tina [name] was given graham crackers and water. Tina [name] proceeded to exercise today without complaints today. Blood pressures were improved today. Tina [name] came back after class to report that her 51 year old son tested positive for COVID 19 as he had a fever and left school to go to the pediatrician. I advised Tina [name] to test herself for COVID 19 today and to retest her self in 3 days and next Sunday prior to returning to exercise on Monday. Patient states understanding.Tina Pall, RN,BSN 12/03/2020 4:27 PM

## 2020-12-03 NOTE — Progress Notes (Signed)
CARDIAC REHAB PHASE 2  Reviewed home exercise with pt today. Pt is tolerating exercise well. Pt will continue to exercise on their own by walking and doing stretches for 30-45 minutes per session 1-2 days a week in addition to the 3 days in CRP2. Advised pt on THRR, RPE scale, hydration and temperature/humidity precautions. Reinforced S/S to stop exercise and when to call MD vs 911. Encouraged warm up cool down and stretches with exercise sessions. Pt verbalized understanding, all questions were answered and pt was given a copy to take home.    Kirby Funk ACSM-EP 12/03/2020 4:30 PM

## 2020-12-05 ENCOUNTER — Encounter (HOSPITAL_COMMUNITY): Payer: Federal, State, Local not specified - PPO

## 2020-12-07 ENCOUNTER — Encounter (HOSPITAL_COMMUNITY): Payer: Federal, State, Local not specified - PPO

## 2020-12-10 ENCOUNTER — Encounter (HOSPITAL_COMMUNITY): Payer: Federal, State, Local not specified - PPO

## 2020-12-10 ENCOUNTER — Telehealth (HOSPITAL_COMMUNITY): Payer: Self-pay | Admitting: Family Medicine

## 2020-12-12 ENCOUNTER — Encounter (HOSPITAL_COMMUNITY): Payer: Federal, State, Local not specified - PPO

## 2020-12-12 ENCOUNTER — Encounter (HOSPITAL_COMMUNITY)
Admission: RE | Admit: 2020-12-12 | Discharge: 2020-12-12 | Disposition: A | Discharge status: Home / Self Care | Payer: Federal, State, Local not specified - PPO | Source: Ambulatory Visit | Attending: Cardiology | Admitting: Cardiology

## 2020-12-12 ENCOUNTER — Other Ambulatory Visit: Payer: Self-pay

## 2020-12-12 DIAGNOSIS — I214 Non-ST elevation (NSTEMI) myocardial infarction: Secondary | ICD-10-CM | POA: Diagnosis not present

## 2020-12-12 DIAGNOSIS — Z955 Presence of coronary angioplasty implant and graft: Secondary | ICD-10-CM

## 2020-12-12 NOTE — Progress Notes (Signed)
Cardiac Individual Treatment Plan  Patient Details  Name: Tina [name] MRN: [id number] Date of Birth: 1970-04-16 Referring Provider:   Flowsheet Row CARDIAC REHAB PHASE II ORIENTATION from 11/01/2020 in [facility]  Referring Provider Dr. Rex Kras, DO       Initial Encounter Date:  [facility] from 11/01/2020 in [facility]  Date 11/01/20       Visit Diagnosis: 09/05/20 NSTEMI (non-ST elevated myocardial infarction) ([facility])  09/05/20 S/P DES OM2  Patient's Home Medications on Admission:  Current Outpatient Medications:    albuterol (VENTOLIN HFA) 108 (90 Base) MCG/ACT inhaler, Inhale 2 puffs into the lungs every 4 (four) hours as needed for wheezing or shortness of breath., Disp: 18 g, Rfl: 1   allopurinol (ZYLOPRIM) 100 MG tablet, Take 100 mg by mouth at bedtime., Disp: , Rfl:    amLODipine (NORVASC) 5 MG tablet, Take 1 tablet (5 mg total) by mouth daily., Disp: 90 tablet, Rfl: 0   aspirin EC 81 MG tablet, Take 81 mg by mouth at bedtime. Swallow whole., Disp: , Rfl:    atorvastatin (LIPITOR) 40 MG tablet, Take 1 tablet (40 mg total) by mouth at bedtime., Disp: 90 tablet, Rfl: 0   carvedilol (COREG) 25 MG tablet, Take 25 mg by mouth 2 (two) times daily., Disp: , Rfl:    cetirizine (ZYRTEC) 10 MG tablet, Take 10 mg by mouth daily as needed for allergies (itching)., Disp: , Rfl:    cloNIDine (CATAPRES) 0.2 MG tablet, Take 0.4 mg by mouth 2 (two) times daily., Disp: , Rfl:    clopidogrel (PLAVIX) 75 MG tablet, Take 1 tablet by mouth once daily, Disp: 30 tablet, Rfl: 0   Continuous Blood Gluc Sensor (FREESTYLE LIBRE 2 SENSOR) MISC, Inject 1 Device into the skin every 14 (fourteen) days., Disp: , Rfl:    DULoxetine (CYMBALTA) 20 MG capsule, Take 20-40 mg by mouth See admin instructions. Take 20 mg by mouth in the morning and 40 mg at bedtime, Disp: , Rfl:    EPINEPHrine (AUVI-Q) 0.3  mg/0.3 mL IJ SOAJ injection, Inject 0.3 mg into the muscle as needed for anaphylaxis., Disp: 1 each, Rfl: 1   famotidine (PEPCID) 20 MG tablet, Take 1 tablet (20 mg total) by mouth 2 (two) times daily., Disp: 60 tablet, Rfl: 5   furosemide (LASIX) 40 MG tablet, Take 40 mg by mouth in the morning., Disp: , Rfl:    hydrALAZINE (APRESOLINE) 10 MG tablet, Take 1 tablet (10 mg total) by mouth 3 (three) times daily., Disp: 90 tablet, Rfl: 0   Insulin Aspart FlexPen (NOVOLOG) 100 UNIT/ML, Inject 0-25 Units into the skin 3 (three) times daily before meals. Dose per sliding scale., Disp: , Rfl:    Insulin Glargine (BASAGLAR KWIKPEN) 100 UNIT/ML, Inject 22 Units into the skin at bedtime. (Patient taking differently: Inject 18 Units into the skin at bedtime.), Disp: , Rfl:    isosorbide mononitrate (IMDUR) 60 MG 24 hr tablet, Take 1 tablet (60 mg total) by mouth daily., Disp: 30 tablet, Rfl: 3   Semaglutide, 1 MG/DOSE, (OZEMPIC, 1 MG/DOSE,) 2 MG/1.5ML SOPN, Inject 1 mg into the skin every Friday. (Patient not taking: Reported on 11/26/2020), Disp: , Rfl:    tiZANidine (ZANAFLEX) 2 MG tablet, Take 1 tablet (2 mg total) by mouth at bedtime., Disp: 30 tablet, Rfl: 2   traMADol (ULTRAM) 50 MG tablet, Take 1 tablet by mouth twice daily, Disp: 60  tablet, Rfl: 0  Past Medical History: Past Medical History:  Diagnosis Date   Allergy    Anemia    DURING MENSES--HAS HEAVY BLEEDING WITH PERODS   Angioedema 08/13/2020   Anxiety    Back pain, chronic    "ongoing"   Blood transfusion    IN 2012  AFTER C -SECTION   Cerebral thrombosis with cerebral infarction ([place]) 06/2009   RIGHT SIDED WEAKNESS ( ARM AND LEG ) AND SPASMS-remains with slight weakness and vertigo.   Constipation    Depression    Diabetes mellitus    Diabetic neuropathy (HCC)    BOTH FEET --COMES AND GOES   Edema, lower extremity    Endometriosis    Fatty liver    GERD (gastroesophageal reflux disease)    with pregnancy   H/O eye surgery     Headache(784.0)    MIGRAINES--NOT REALLY HEADACHE-MORE LIKE PRESSURE SENSATION IN HEAD-FEELS DIZZIY AND  FAINT AS THE PRESSURE RESOLVES   Hernia, incisional, RLQ, s/p lap repair Sep 2013 09/02/2011   History of vertigo 03/22/2018   Hx of migraines 10/19/2011   Hyperlipidemia    Hypertension    Leg pain, right    "like bad Charley horse"   Multiple food allergies    Panic disorder without agoraphobia    Rash    HANDS, ARMS --STATES HX OF RASH [place].  STATES THE RASH OFTEN OCCURS WHEN SHE IS REALLY STRESSED."goes and comes-presently left ring finger"   Restless leg syndrome    DIAGNOSED BY SLEEP STUDY - PT TOLD SHE DID NOT HAVE SLEEP APNEA   Right rotator cuff tear    PAIN IN RIGHT SHOULDER   SBO (small bowel obstruction) ([place]) 06/03/2012   Shortness of breath    Spastic hemiplegia affecting dominant side (HCC)    Stomach problems    Stroke (HCC)    Ventral hernia    RIGHT LOWER QUADRANT-CAUSING SOME PAIN   Weakness of right side of body     Tobacco Use: Social History   Tobacco Use  Smoking Status Never  Smokeless Tobacco Never    Labs: Recent Review Flowsheet Data     Labs for ITP Cardiac and Pulmonary Rehab Latest Ref Rng & Units 04/12/2019 08/02/2019 08/13/2020 09/05/2020 09/06/2020   Cholestrol 0 - 200 mg/dL - 191 - - 175   LDLCALC 0 - 99 mg/dL - 102 - - 92   HDL >40 mg/dL - 54 - - 56   Trlycerides <150 mg/dL - 207(A) - - 137   Hemoglobin A1c 4.8 - 5.6 % 10.1(H) 8.4 8.4(H) - -   PHART 7.350 - 7.450 - - - 7.384 -   PCO2ART 32.0 - 48.0 mmHg - - - 36.2 -   HCO3 20.0 - 28.0 mmol/L - - - 21.6 -   TCO2 22 - 32 mmol/L - - - 23 -   ACIDBASEDEF 0.0 - 2.0 mmol/L - - - 3.0(H) -   O2SAT % - - - 96.0 -       Capillary Blood Glucose: Lab Results  Component Value Date   GLUCAP 352 (H) 11/26/2020   GLUCAP 238 (H) 11/21/2020   GLUCAP 109 (H) 11/14/2020   GLUCAP 172 (H) 11/14/2020   GLUCAP 136 (H) 11/09/2020     Exercise Target Goals: Exercise  Program Goal: Individual exercise prescription set using results from initial 6 min walk test and THRR while considering  patient's activity barriers and safety.   Exercise Prescription Goal: Starting with aerobic  activity 30 plus minutes a day, 3 days per week for initial exercise prescription. Provide home exercise prescription and guidelines that participant acknowledges understanding prior to discharge.  Activity Barriers & Risk Stratification:  Activity Barriers & Cardiac Risk Stratification - 11/01/20 1640       Activity Barriers & Cardiac Risk Stratification   Activity Barriers Chest Pain/Angina;Back Problems;Deconditioning;Balance Concerns;History of Falls;Shortness of Breath    Cardiac Risk Stratification High             6 Minute Walk:  6 Minute Walk     Row Name 11/01/20 1632         6 Minute Walk   Phase Initial     Distance 1262 feet     Walk Time 6 minutes     # of Rest Breaks 0     MPH 2.39     METS 3.31     RPE 15     Perceived Dyspnea  1     VO2 Peak 11.57     Symptoms Yes (comment)     Comments headache 5/10 pain. Left side, hip, back sciatica 9/10 (both relieved with rest)     Resting HR 82 bpm     Resting BP 130/80     Resting Oxygen Saturation  99 %     Exercise Oxygen Saturation  during 6 min walk 99 %     Max Ex. HR 96 bpm     Max Ex. BP 162/90     2 Minute Post BP 144/72              Oxygen Initial Assessment:   Oxygen Re-Evaluation:   Oxygen Discharge (Final Oxygen Re-Evaluation):   Initial Exercise Prescription:  Initial Exercise Prescription - 11/01/20 1600       Date of Initial Exercise RX and Referring Provider   Date 11/01/20    Referring Provider Dr. Rex Kras, DO    Expected Discharge Date 12/28/20      NuStep   Level 1    SPM 75    Minutes 25    METs 1.9      Prescription Details   Frequency (times per week) 3    Duration Progress to 30 minutes of continuous aerobic without signs/symptoms of physical  distress      Intensity   THRR 40-80% of Max Heartrate 68-135    Ratings of Perceived Exertion 11-13    Perceived Dyspnea 0-4      Progression   Progression Continue progressive overload as per policy without signs/symptoms or physical distress.      Resistance Training   Training Prescription Yes    Weight 2    Reps 10-15             Perform Capillary Blood Glucose checks as needed.  Exercise Prescription Changes:   Exercise Prescription Changes     Row Name 11/09/20 1600 12/03/20 1615           Response to Exercise   Blood Pressure (Admit) 124/80 128/70      Blood Pressure (Exercise) 124/80 138/88      Blood Pressure (Exit) 138/78 134/72      Heart Rate (Admit) 94 bpm 75 bpm      Heart Rate (Exercise) 93 bpm 77 bpm      Heart Rate (Exit) 83 bpm 65 bpm      Rating of Perceived Exertion (Exercise) 13 9      Perceived Dyspnea (Exercise) 0 0  Symptoms chronic groin pain, low CBG post exercise 102 ginger ale given 0      Comments Pt first day of exercise in the CRP2 program Reviewed MEt's, goals and home ex rx      Duration Progress to 30 minutes of  aerobic without signs/symptoms of physical distress Progress to 30 minutes of  aerobic without signs/symptoms of physical distress      Intensity THRR unchanged THRR unchanged        Progression   Progression Continue to progress workloads to maintain intensity without signs/symptoms of physical distress. Continue to progress workloads to maintain intensity without signs/symptoms of physical distress.      Average METs 1.7 1.7        Resistance Training   Training Prescription Yes Yes      Weight 2 2      Reps 10-15 10-15      Time 10 Minutes 10 Minutes        NuStep   Level 1 1      SPM 70 70      Minutes 25 25      METs 1.7 1.7        Home Exercise Plan   Plans to continue exercise at -- Home (comment)      Frequency -- Add 2 additional days to program exercise sessions.      Initial Home Exercises  Provided -- 12/03/20               Exercise Comments:   Exercise Comments     Row Name 11/09/20 1013 11/09/20 1600 12/03/20 1629       Exercise Comments -- Pt first day in the CRP2 program. Pt tolerated fair with chronic groin/siatica pain 7/10, and low CBG post exercise at 102, ginger ale given. Pt average MET level of 1.7. Pt is learning her THRR, RPE and exercise Rx. Will continue to monitor pt and progress workloads as tolerated without sign or symptom Reviewed MET's goals and home ex rx. Pt is tolerating exercise well with an average MET level of 1.7. Pt feels like she is advancing towards her goals, but still has trouble with SOB, she is hoing to lose some wt and become less SOB. Pt will continue to exercise by walking and doing stretched 1-2 additional days at home for 30-45 minutes per session              Exercise Goals and Review:   Exercise Goals     Row Name 11/01/20 1643             Exercise Goals   Increase Physical Activity Yes       Intervention Provide advice, education, support and counseling about physical activity/exercise needs.;Develop an individualized exercise prescription for aerobic and resistive training based on initial evaluation findings, risk stratification, comorbidities and participant's personal goals.       Expected Outcomes Short Term: Attend rehab on a regular basis to increase amount of physical activity.;Long Term: Add in home exercise to make exercise part of routine and to increase amount of physical activity.;Long Term: Exercising regularly at least 3-5 days a week.       Increase Strength and Stamina Yes       Intervention Provide advice, education, support and counseling about physical activity/exercise needs.;Develop an individualized exercise prescription for aerobic and resistive training based on initial evaluation findings, risk stratification, comorbidities and participant's personal goals.       Expected Outcomes Short Term:  Increase workloads from initial exercise prescription for resistance, speed, and METs.;Short Term: Perform resistance training exercises routinely during rehab and add in resistance training at home;Long Term: Improve cardiorespiratory fitness, muscular endurance and strength as measured by increased METs and functional capacity (6MWT)       Able to understand and use rate of perceived exertion (RPE) scale Yes       Intervention Provide education and explanation on how to use RPE scale       Expected Outcomes Short Term: Able to use RPE daily in rehab to express subjective intensity level;Long Term:  Able to use RPE to guide intensity level when exercising independently       Able to understand and use Dyspnea scale Yes       Intervention Provide education and explanation on how to use Dyspnea scale       Expected Outcomes Short Term: Able to use Dyspnea scale daily in rehab to express subjective sense of shortness of breath during exertion;Long Term: Able to use Dyspnea scale to guide intensity level when exercising independently       Knowledge and understanding of Target Heart Rate Range (THRR) Yes       Intervention Provide education and explanation of THRR including how the numbers were predicted and where they are located for reference       Expected Outcomes Short Term: Able to state/look up THRR;Long Term: Able to use THRR to govern intensity when exercising independently;Short Term: Able to use daily as guideline for intensity in rehab       Understanding of Exercise Prescription Yes       Intervention Provide education, explanation, and written materials on patient's individual exercise prescription       Expected Outcomes Short Term: Able to explain program exercise prescription;Long Term: Able to explain home exercise prescription to exercise independently                Exercise Goals Re-Evaluation :  Exercise Goals Re-Evaluation     [place] Name 11/09/20 1600 11/16/20 1013 12/03/20  1622         Exercise Goal Re-Evaluation   Exercise Goals Review Increase Physical Activity;Increase Strength and Stamina;Able to understand and use rate of perceived exertion (RPE) scale;Knowledge and understanding of Target Heart Rate Range (THRR);Understanding of Exercise Prescription -- Increase Physical Activity;Increase Strength and Stamina;Able to understand and use rate of perceived exertion (RPE) scale;Knowledge and understanding of Target Heart Rate Range (THRR);Understanding of Exercise Prescription     Comments Pt first day in the CRP2 program. Pt tolerated fair with chronic groin/siatica pain 7/10, and low CBG post exercise at 102, ginger ale given. Pt average MET level of 1.7. Pt is learning her THRR, RPE and exercise Rx. -- Reviewed MET's goals and home ex rx. Pt is tolerating exercise well with an average MET level of 1.7. Pt feels like she is advancing towards her goals, but still has trouble with SOB, she is hoing to lose some wt and become less SOB. Pt will continue to exercise by walking and doing stretched 1-2 additional days at home for 30-45 minutes per session     Expected Outcomes Will continue to monitor pt and progress workloads as tolerated. -- Will continue to monitor pt and progress workloads as tolerated. Pt will continue to exercise on her own at home               Discharge Exercise Prescription (Final Exercise Prescription Changes):  Exercise Prescription Changes -  12/03/20 1615       Response to Exercise   Blood Pressure (Admit) 128/70    Blood Pressure (Exercise) 138/88    Blood Pressure (Exit) 134/72    Heart Rate (Admit) 75 bpm    Heart Rate (Exercise) 77 bpm    Heart Rate (Exit) 65 bpm    Rating of Perceived Exertion (Exercise) 9    Perceived Dyspnea (Exercise) 0    Symptoms 0    Comments Reviewed MEt's, goals and home ex rx    Duration Progress to 30 minutes of  aerobic without signs/symptoms of physical distress    Intensity THRR unchanged       Progression   Progression Continue to progress workloads to maintain intensity without signs/symptoms of physical distress.    Average METs 1.7      Resistance Training   Training Prescription Yes    Weight 2    Reps 10-15    Time 10 Minutes      NuStep   Level 1    SPM 70    Minutes 25    METs 1.7      Home Exercise Plan   Plans to continue exercise at Home (comment)    Frequency Add 2 additional days to program exercise sessions.    Initial Home Exercises Provided 12/03/20             Nutrition:  Target Goals: Understanding of nutrition guidelines, daily intake of sodium <1542m, cholesterol <2053m calories 30% from fat and 7% or less from saturated fats, daily to have 5 or more servings of fruits and vegetables.  Biometrics:  Pre Biometrics - 11/01/20 1631       Pre Biometrics   Waist Circumference 50 inches    Hip Circumference 46 inches    Waist to Hip Ratio 1.09 %    Triceps Skinfold 52 mm    % Body Fat 52.7 %    Grip Strength 25 kg    Flexibility 10 in    Single Leg Stand 30 seconds              Nutrition Therapy Plan and Nutrition Goals:   Nutrition Assessments:  MEDIFICTS Score Key: ?70 Need to make dietary changes  40-70 Heart Healthy Diet ? 40 Therapeutic Level Cholesterol Diet   Picture Your Plate Scores: <4<56nhealthy dietary pattern with much room for improvement. 41-50 Dietary pattern unlikely to meet recommendations for good health and room for improvement. 51-60 More healthful dietary pattern, with some room for improvement.  >60 Healthy dietary pattern, although there may be some specific behaviors that could be improved.    Nutrition Goals Re-Evaluation:   Nutrition Goals Discharge (Final Nutrition Goals Re-Evaluation):   Psychosocial: Target Goals: Acknowledge presence or absence of significant depression and/or stress, maximize coping skills, provide positive support system. Participant is able to verbalize types and  ability to use techniques and skills needed for reducing stress and depression.  Initial Review & Psychosocial Screening:  Initial Psych Review & Screening - 11/02/20 1038       Initial Review   Current issues with Current Depression;Current Stress Concerns;History of Depression    Source of Stress Concerns Family;Chronic Illness;Unable to participate in former interests or hobbies;Unable to perform yard/household activities    Comments Breeann is currently depressed and is taking an antidepressant. Patriece's Dog passes away recently. Leilana is rasing 3 boys one 1066nd 1239ear old twins with her husband.      Family Dynamics  Good Support System? Yes   Pier has her husband and sister in law for support     Barriers   Psychosocial barriers to participate in program The patient should benefit from training in stress management and relaxation.;Psychosocial barriers identified (see note)      Screening Interventions   Interventions To provide support and resources with identified psychosocial needs;Encouraged to exercise;Provide feedback about the scores to participant    Expected Outcomes Long Term Goal: Stressors or current issues are controlled or eliminated.;Long Term goal: The participant improves quality of Life and PHQ9 Scores as seen by post scores and/or verbalization of changes;Short Term goal: Identification and review with participant of any Quality of Life or Depression concerns found by scoring the questionnaire.             Quality of Life Scores:  Quality of Life - 11/01/20 1653       Quality of Life   Select Quality of Life      Quality of Life Scores   Health/Function Pre 13.2 %    Socioeconomic Pre 24.38 %    Psych/Spiritual Pre 18 %    Family Pre 26.4 %    GLOBAL Pre 18.6 %            Scores of 19 and below usually indicate a poorer quality of life in these areas.  A difference of  2-3 points is a clinically meaningful difference.  A difference of  2-3 points in the total score of the Quality of Life Index has been associated with significant improvement in overall quality of life, self-image, physical symptoms, and general health in studies assessing change in quality of life.  PHQ-9: Recent Review Flowsheet Data     Depression screen [facility] 2/9 11/09/2020 11/02/2020 10/30/2020 07/27/2019 02/09/2019   Decreased Interest 3 0 1 0 0   Down, Depressed, Hopeless '3 1 1 1 1   ' PHQ - 2 Score '6 1 2 1 1   ' Altered sleeping 3 - - - -   Tired, decreased energy 3 - - - -   Change in appetite 2 - - - -   Feeling bad or failure about yourself  0 - - - -   Trouble concentrating 2 - - - -   Moving slowly or fidgety/restless 1 - - - -   Suicidal thoughts 0 - - - -   PHQ-9 Score 17 - - - -   Difficult doing work/chores Somewhat difficult - - - -      Interpretation of Total Score  Total Score Depression Severity:  1-4 = Minimal depression, 5-9 = Mild depression, 10-14 = Moderate depression, 15-19 = Moderately severe depression, 20-27 = Severe depression   Psychosocial Evaluation and Intervention:   Psychosocial Re-Evaluation:  Psychosocial Re-Evaluation     [place] Name 11/13/20 1431 12/12/20 1631           Psychosocial Re-Evaluation   Current issues with Current Depression;History of Depression;Current Stress Concerns Current Depression;History of Depression;Current Stress Concerns      Comments Quality of life questionnaire reviewed. Admits to being currently depressed. Forwarded results to Dr Theda Sers, primary care provider. Cannie says she is receiving spiritual counselling as a goup for the recent loss of her brother in law from [place] has not voiced experiencing any increased depression. Dyiriest is getting ready to move to a new home next weekend. Mercades continues to manage her home life with her family and has done some substitute teaching recently  Expected Outcomes Will continue to monitor and offer support as needed. Will  continue to monitor and offer support as needed.      Interventions Stress management education;Relaxation education;Encouraged to attend Cardiac Rehabilitation for the exercise Stress management education;Relaxation education;Encouraged to attend Cardiac Rehabilitation for the exercise      Continue Psychosocial Services  Follow up required by staff Follow up required by staff        Initial Review   Source of Stress Concerns Chronic Illness;Unable to participate in former interests or hobbies;Unable to perform yard/household activities Chronic Illness;Unable to participate in former interests or hobbies;Unable to perform yard/household activities      Comments Will continue to monitor and offer support as needed. Will continue to monitor and offer support as needed.               Psychosocial Discharge (Final Psychosocial Re-Evaluation):  Psychosocial Re-Evaluation - 12/12/20 1631       Psychosocial Re-Evaluation   Current issues with Current Depression;History of Depression;Current Stress Concerns    Comments Rhenda has not voiced experiencing any increased depression. Dyiriest is getting ready to move to a new home next weekend. Guilianna continues to manage her home life with her family and has done some substitute teaching recently    Expected Outcomes Will continue to monitor and offer support as needed.    Interventions Stress management education;Relaxation education;Encouraged to attend Cardiac Rehabilitation for the exercise    Continue Psychosocial Services  Follow up required by staff      Initial Review   Source of Stress Concerns Chronic Illness;Unable to participate in former interests or hobbies;Unable to perform yard/household activities    Comments Will continue to monitor and offer support as needed.             Vocational Rehabilitation: Provide vocational rehab assistance to qualifying candidates.   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            Education: Education Goals: Education classes will be provided on a weekly basis, covering required topics. Participant will state understanding/return demonstration of topics presented.  Learning Barriers/Preferences:  Learning Barriers/Preferences - 11/01/20 1654       Learning Barriers/Preferences   Learning Barriers Exercise Concerns   weakness in right leg and right hand   Learning Preferences Group Instruction;Individual Instruction;Pictoral;Skilled Demonstration;Video;Written Material             Education Topics: Hypertension, Hypertension Reduction -Define heart disease and high blood pressure. Discus how high blood pressure affects the body and ways to reduce high blood pressure.   Exercise and Your Heart -Discuss why it is important to exercise, the FITT principles of exercise, normal and abnormal responses to exercise, and how to exercise safely.   Angina -Discuss definition of angina, causes of angina, treatment of angina, and how to decrease risk of having angina.   Cardiac Medications -Review what the following cardiac medications are used for, how they affect the body, and side effects that may occur when taking the medications.  Medications include Aspirin, Beta blockers, calcium channel blockers, ACE Inhibitors, angiotensin receptor blockers, diuretics, digoxin, and antihyperlipidemics.   Congestive Heart Failure -Discuss the definition of CHF, how to live with CHF, the signs and symptoms of CHF,  and how keep track of weight and sodium intake.   Heart Disease and Intimacy -Discus the effect sexual activity has on the heart, how changes occur during intimacy as we age, and safety during sexual activity.   Smoking Cessation /  COPD -Discuss different methods to quit smoking, the health benefits of quitting smoking, and the definition of COPD.   Nutrition I: Fats -Discuss the types of cholesterol, what cholesterol does to the heart, and how cholesterol levels can be controlled.   Nutrition II: Labels -Discuss the different components of food labels and how to read food label   Heart Parts/Heart Disease and PAD -Discuss the anatomy of the heart, the pathway of blood circulation through the heart, and these are affected by heart disease.   Stress I: Signs and Symptoms -Discuss the causes of stress, how stress may lead to anxiety and depression, and ways to limit stress.   Stress II: Relaxation -Discuss different types of relaxation techniques to limit stress.   Warning Signs of Stroke / TIA -Discuss definition of a stroke, what the signs and symptoms are of a stroke, and how to identify when someone is having stroke.   Knowledge Questionnaire Score:  Knowledge Questionnaire Score - 11/02/20 0833       Knowledge Questionnaire Score   Pre Score 16/24             Core Components/Risk Factors/Patient Goals at Admission:  Personal Goals and Risk Factors at Admission - 11/02/20 0833       Core Components/Risk Factors/Patient Goals on Admission    Weight Management Yes;Obesity;Weight Loss    Intervention Weight Management: Develop a combined nutrition and exercise program designed to reach desired caloric intake, while maintaining appropriate intake of nutrient and fiber, sodium and fats, and appropriate energy expenditure required for the weight goal.;Weight Management: Provide education and appropriate resources to help participant work on and attain dietary goals.;Weight Management/Obesity: Establish reasonable short term and long term weight goals.;Obesity: Provide education and appropriate resources to help participant work on and attain dietary goals.    Admit Weight 256 lb 2.8 oz (116.2 kg)     Expected Outcomes Short Term: Continue to assess and modify interventions until short term weight is achieved;Long Term: Adherence to nutrition and physical activity/exercise program aimed toward attainment of established weight goal;Weight Loss: Understanding of general recommendations for a balanced deficit meal plan, which promotes 1-2 lb weight loss per week and includes a negative energy balance of [phone] kcal/d;Understanding recommendations for meals to include 15-35% energy as protein, 25-35% energy from fat, 35-60% energy from carbohydrates, less than 221m of dietary cholesterol, 20-35 gm of total fiber daily;Understanding of distribution of calorie intake throughout the day with the consumption of 4-5 meals/snacks    Diabetes Yes    Intervention Provide education about signs/symptoms and action to take for hypo/hyperglycemia.;Provide education about proper nutrition, including hydration, and aerobic/resistive exercise prescription along with prescribed medications to achieve blood glucose in normal ranges: Fasting glucose 65-99 mg/dL    Expected Outcomes Short Term: Participant verbalizes understanding of the signs/symptoms and immediate care of hyper/hypoglycemia, proper foot care and importance of medication, aerobic/resistive exercise and nutrition plan for blood glucose control.;Long Term: Attainment of HbA1C < 7%.    Heart Failure Yes    Intervention Provide a combined exercise and nutrition program that is supplemented with education, support and counseling about heart failure. Directed toward relieving symptoms such as shortness of breath, decreased exercise tolerance, and extremity edema.    Expected  Outcomes Short term: Attendance in program 2-3 days a week with increased exercise capacity. Reported lower sodium intake. Reported increased fruit and vegetable intake. Reports medication compliance.;Short term: Daily weights obtained and reported for increase. Utilizing diuretic protocols set  by physician.;Long term: Adoption of self-care skills and reduction of barriers for early signs and symptoms recognition and intervention leading to self-care maintenance.    Hypertension Yes    Intervention Provide education on lifestyle modifcations including regular physical activity/exercise, weight management, moderate sodium restriction and increased consumption of fresh fruit, vegetables, and low fat dairy, alcohol moderation, and smoking cessation.;Monitor prescription use compliance.    Expected Outcomes Short Term: Continued assessment and intervention until BP is < 140/2m HG in hypertensive participants. < 130/835mHG in hypertensive participants with diabetes, heart failure or chronic kidney disease.;Long Term: Maintenance of blood pressure at goal levels.    Lipids Yes    Intervention Provide education and support for participant on nutrition & aerobic/resistive exercise along with prescribed medications to achieve LDL <7038mHDL >17m29m  Expected Outcomes Short Term: Participant states understanding of desired cholesterol values and is compliant with medications prescribed. Participant is following exercise prescription and nutrition guidelines.;Long Term: Cholesterol controlled with medications as prescribed, with individualized exercise RX and with personalized nutrition plan. Value goals: LDL < 70mg64mL > 40 mg.    Stress Yes    Intervention Offer individual and/or small group education and counseling on adjustment to heart disease, stress management and health-related lifestyle change. Teach and support self-help strategies.;Refer participants experiencing significant psychosocial distress to appropriate mental health specialists for further evaluation and treatment. When possible, include family members and significant others in education/counseling sessions.    Expected Outcomes Short Term: Participant demonstrates changes in health-related behavior, relaxation and other stress  management skills, ability to obtain effective social support, and compliance with psychotropic medications if prescribed.;Long Term: Emotional wellbeing is indicated by absence of clinically significant psychosocial distress or social isolation.    Personal Goal Other Yes    Personal Goal Short term: walk longer without SOB and CP Long term: lose wt and gain endurance    Intervention Will continue to monitor pt and progress workloads as tolerated without sign or symptom    Expected Outcomes Pt will achieve her goals             Core Components/Risk Factors/Patient Goals Review:   Goals and Risk Factor Review     Row Name 11/13/20 1439 12/12/20 1633           Core Components/Risk Factors/Patient Goals Review   Personal Goals Review Weight Management/Obesity;Hypertension;Lipids;Diabetes;Stress Weight Management/Obesity;Hypertension;Lipids;Diabetes;Stress      Review Brantlee started exercise at cardiac rehab on 11/09/20 and is deconditioned. VSS. CBG's Stable. Cordie reported having sciatica pain. Andreia is looking forward to participating in the program. Kadience does well with exercise when able to attend and reports feeling stronger since participation. [place] continues to have variations with her CBG's and blood pressures will continuen to monitor.      Expected Outcomes Marygrace will continue to participate in phase 2 cardiac rehab for exercise, nutrition and lifestyle modifications. Daisja will continue to participate in phase 2 cardiac rehab for exercise, nutrition and lifestyle modifications.               Core Components/Risk Factors/Patient Goals at Discharge (Final Review):   Goals and Risk Factor Review - 12/12/20 1633       Core Components/Risk Factors/Patient Goals Review   Personal Goals  Review Weight Management/Obesity;Hypertension;Lipids;Diabetes;Stress    Review Paulita does well with exercise when able to attend and reports feeling stronger since participation.  Shiquita continues to have variations with her CBG's and blood pressures will continuen to monitor.    Expected Outcomes Issis will continue to participate in phase 2 cardiac rehab for exercise, nutrition and lifestyle modifications.             ITP Comments:  ITP Comments     Row Name 11/01/20 1543 11/13/20 1429 12/12/20 1629       ITP Comments Dr Fransico Him MD, Medical Director 30 Day ITP Review. Sallyanne started exercise at cardiac rehab on 11/09/20. Reported having sciatica pain and is deconditoned has prior hx of CVA with right sided weakness. 30 Day ITP Review. Abbigayle has good participation when able to attend. Dyruest has had some issues with hyperglycemia and was out with one of her children who test positive for COVID 19.              Comments: See ITP comments.Harrell Gave RN BSN

## 2020-12-14 ENCOUNTER — Encounter (HOSPITAL_COMMUNITY): Payer: Self-pay | Admitting: Emergency Medicine

## 2020-12-14 ENCOUNTER — Telehealth (HOSPITAL_COMMUNITY): Payer: Self-pay | Admitting: Family Medicine

## 2020-12-14 ENCOUNTER — Encounter (HOSPITAL_COMMUNITY): Payer: Federal, State, Local not specified - PPO

## 2020-12-14 ENCOUNTER — Emergency Department (HOSPITAL_COMMUNITY)
Admission: EM | Admit: 2020-12-14 | Discharge: 2020-12-14 | Disposition: A | Discharge status: Home / Self Care | Payer: Federal, State, Local not specified - PPO | Attending: Emergency Medicine | Admitting: Emergency Medicine

## 2020-12-14 DIAGNOSIS — I10 Essential (primary) hypertension: Secondary | ICD-10-CM | POA: Diagnosis not present

## 2020-12-14 DIAGNOSIS — I160 Hypertensive urgency: Secondary | ICD-10-CM | POA: Diagnosis not present

## 2020-12-14 DIAGNOSIS — I959 Hypotension, unspecified: Secondary | ICD-10-CM | POA: Diagnosis not present

## 2020-12-14 DIAGNOSIS — Z5321 Procedure and treatment not carried out due to patient leaving prior to being seen by health care provider: Secondary | ICD-10-CM | POA: Insufficient documentation

## 2020-12-14 DIAGNOSIS — R1110 Vomiting, unspecified: Secondary | ICD-10-CM | POA: Diagnosis not present

## 2020-12-14 DIAGNOSIS — G4489 Other headache syndrome: Secondary | ICD-10-CM | POA: Diagnosis not present

## 2020-12-14 DIAGNOSIS — R112 Nausea with vomiting, unspecified: Secondary | ICD-10-CM | POA: Diagnosis not present

## 2020-12-14 LAB — CBC
HCT: 46.9 % — ABNORMAL HIGH (ref 36.0–46.0)
Hemoglobin: 15.1 g/dL — ABNORMAL HIGH (ref 12.0–15.0)
MCH: 27.1 pg (ref 26.0–34.0)
MCHC: 32.2 g/dL (ref 30.0–36.0)
MCV: 84.2 fL (ref 80.0–100.0)
Platelets: 371 10*3/uL (ref 150–400)
RBC: 5.57 MIL/uL — ABNORMAL HIGH (ref 3.87–5.11)
RDW: 13.7 % (ref 11.5–15.5)
WBC: 9.4 10*3/uL (ref 4.0–10.5)
nRBC: 0.2 % (ref 0.0–0.2)

## 2020-12-14 LAB — COMPREHENSIVE METABOLIC PANEL
ALT: 16 U/L (ref 0–44)
AST: 19 U/L (ref 15–41)
Albumin: 3.5 g/dL (ref 3.5–5.0)
Alkaline Phosphatase: 60 U/L (ref 38–126)
Anion gap: 16 — ABNORMAL HIGH (ref 5–15)
BUN: 19 mg/dL (ref 6–20)
CO2: 18 mmol/L — ABNORMAL LOW (ref 22–32)
Calcium: 9.5 mg/dL (ref 8.9–10.3)
Chloride: 108 mmol/L (ref 98–111)
Creatinine, Ser: 1.39 mg/dL — ABNORMAL HIGH (ref 0.44–1.00)
GFR, Estimated: 46 mL/min — ABNORMAL LOW (ref >60)
Glucose, Bld: 241 mg/dL — ABNORMAL HIGH (ref 70–99)
Potassium: 3.9 mmol/L (ref 3.5–5.1)
Sodium: 142 mmol/L (ref 135–145)
Total Bilirubin: 1.4 mg/dL — ABNORMAL HIGH (ref 0.3–1.2)
Total Protein: 8.3 g/dL — ABNORMAL HIGH (ref 6.5–8.1)

## 2020-12-14 LAB — LIPASE, BLOOD: Lipase: 36 U/L (ref 11–51)

## 2020-12-14 NOTE — ED Notes (Signed)
X1 vitals recheck with no response 

## 2020-12-14 NOTE — ED Notes (Signed)
X2 no response °

## 2020-12-14 NOTE — ED Notes (Signed)
X3 no response 

## 2020-12-14 NOTE — ED Triage Notes (Signed)
Patient BIB [facility] from home for evaluation of nausea and vomiting for the last few days. Patient was prescribed phenergan yesterday by PCP, patient states she has taken this medication without relief of symptoms. Patient also has history of hypertension but has not been able to take antihypertensives due to nausea and vomiting.

## 2020-12-17 ENCOUNTER — Other Ambulatory Visit: Payer: Self-pay | Admitting: Cardiology

## 2020-12-17 ENCOUNTER — Encounter (HOSPITAL_COMMUNITY): Payer: Federal, State, Local not specified - PPO

## 2020-12-17 ENCOUNTER — Telehealth (HOSPITAL_COMMUNITY): Payer: Self-pay | Admitting: Family Medicine

## 2020-12-18 ENCOUNTER — Telehealth (HOSPITAL_COMMUNITY): Payer: Self-pay | Admitting: Family Medicine

## 2020-12-19 ENCOUNTER — Encounter (HOSPITAL_COMMUNITY): Payer: Federal, State, Local not specified - PPO

## 2020-12-19 DIAGNOSIS — Z955 Presence of coronary angioplasty implant and graft: Secondary | ICD-10-CM | POA: Insufficient documentation

## 2020-12-19 DIAGNOSIS — I214 Non-ST elevation (NSTEMI) myocardial infarction: Secondary | ICD-10-CM | POA: Insufficient documentation

## 2020-12-21 ENCOUNTER — Other Ambulatory Visit: Payer: Self-pay

## 2020-12-21 ENCOUNTER — Encounter (HOSPITAL_COMMUNITY): Payer: Federal, State, Local not specified - PPO

## 2020-12-21 ENCOUNTER — Encounter (HOSPITAL_COMMUNITY)
Admission: RE | Admit: 2020-12-21 | Discharge: 2020-12-21 | Disposition: A | Discharge status: Home / Self Care | Payer: Federal, State, Local not specified - PPO | Source: Ambulatory Visit | Attending: Cardiology | Admitting: Cardiology

## 2020-12-21 DIAGNOSIS — I214 Non-ST elevation (NSTEMI) myocardial infarction: Secondary | ICD-10-CM

## 2020-12-21 DIAGNOSIS — Z955 Presence of coronary angioplasty implant and graft: Secondary | ICD-10-CM

## 2020-12-21 NOTE — Progress Notes (Signed)
Incomplete Session Note  Patient Details  Name: Tina [name] MRN: [id number] Date of Birth: [birth year] Referring Provider:   Flowsheet Row CARDIAC REHAB PHASE II ORIENTATION from 11/01/2020 in [place]  Referring Provider Dr. Rex Kras, DO       Tina [name] did not complete her rehab session.  Tina [name] returned to exercise after being out after having food poisoning. Tina [name] said she had nausea and vomiting for several days. Tina [name] says that she has been able to keep down grits and cream of wheat and that she is feeling better. I advised Tina [name] not to exercise today. Vital signs are as follows. Blood pressure 118/80. Heart rate 73. Oxygen saturation 99% on room air CBG reported 147. Tina [name] plans to return to exercise on Monday as long as she continues to feel better eating solid foods and CBG's are within the parameters for exercise.[name], RN,BSN 12/21/2020 3:29 PM

## 2020-12-24 ENCOUNTER — Encounter (HOSPITAL_COMMUNITY): Payer: Federal, State, Local not specified - PPO

## 2020-12-24 ENCOUNTER — Other Ambulatory Visit: Payer: Self-pay

## 2020-12-24 ENCOUNTER — Other Ambulatory Visit: Payer: Self-pay | Admitting: Cardiology

## 2020-12-24 ENCOUNTER — Encounter (HOSPITAL_COMMUNITY)
Admission: RE | Admit: 2020-12-24 | Discharge: 2020-12-24 | Disposition: A | Discharge status: Home / Self Care | Payer: Federal, State, Local not specified - PPO | Source: Ambulatory Visit | Attending: Cardiology | Admitting: Cardiology

## 2020-12-24 ENCOUNTER — Other Ambulatory Visit: Payer: Self-pay | Admitting: Physical Medicine & Rehabilitation

## 2020-12-24 DIAGNOSIS — I214 Non-ST elevation (NSTEMI) myocardial infarction: Secondary | ICD-10-CM

## 2020-12-24 DIAGNOSIS — Z955 Presence of coronary angioplasty implant and graft: Secondary | ICD-10-CM

## 2020-12-24 DIAGNOSIS — I10 Essential (primary) hypertension: Secondary | ICD-10-CM

## 2020-12-24 DIAGNOSIS — I5189 Other ill-defined heart diseases: Secondary | ICD-10-CM

## 2020-12-25 ENCOUNTER — Encounter: Payer: Self-pay | Admitting: Physical Medicine & Rehabilitation

## 2020-12-25 ENCOUNTER — Other Ambulatory Visit: Payer: Self-pay

## 2020-12-25 ENCOUNTER — Encounter
Payer: Federal, State, Local not specified - PPO | Attending: Physical Medicine & Rehabilitation | Admitting: Physical Medicine & Rehabilitation

## 2020-12-25 VITALS — BP 144/83 | HR 81 | Temp 99.3°F | Ht 66.0 in | Wt 245.2 lb

## 2020-12-25 DIAGNOSIS — I69351 Hemiplegia and hemiparesis following cerebral infarction affecting right dominant side: Secondary | ICD-10-CM | POA: Diagnosis not present

## 2020-12-25 DIAGNOSIS — M7061 Trochanteric bursitis, right hip: Secondary | ICD-10-CM | POA: Diagnosis not present

## 2020-12-25 MED ORDER — TIZANIDINE HCL 2 MG PO TABS: 2.0000 mg | ORAL_TABLET | Freq: Every day | ORAL | 2 refills | Status: DC

## 2020-12-25 NOTE — Progress Notes (Signed)
Subjective:    Patient ID: Tina [name], female    DOB: 16-Aug-1970, 50 y.o.   MRN: [id number]  HPI In cardiac rehab working on recumbent bike, stretching, light weights Pt feels like she is regaining her strength.  Pt enjoying her sessions  Taking ~2 tramadols per week and takes tizanidine qhs  Has Chronic Right hip discomfort no recent falls Is moving , fixing up old house to sell Pain Inventory Average Pain 5 Pain Right Now 3 My pain is sharp, burning, and stabbing  In the last 24 hours, has pain interfered with the following? General activity 9 Relation with others 9 Enjoyment of life 9 What TIME of day is your pain at its worst? morning , daytime, evening, and night Sleep (in general) Fair  Pain is worse with: walking, standing, and some activites Pain improves with: rest, therapy/exercise, and medication Relief from Meds:  na  Family History  Problem Relation Age of Onset   Diabetes Father    Kidney disease Father    Depression Father    Drug abuse Father    Allergic rhinitis Mother    Eczema Mother    Urticaria Mother    Depression Mother    Anxiety disorder Mother    Bipolar disorder Mother    Alcoholism Mother    Drug abuse Mother    Eating disorder Mother    Diabetes Maternal Grandmother    Hyperlipidemia Paternal Grandmother    Stroke Paternal Grandmother    Eczema Sister    Urticaria Sister    Colon cancer Paternal Uncle    Other Neg Hx    Angioedema Neg Hx    Asthma Neg Hx    Colon polyps Neg Hx    Esophageal cancer Neg Hx    Rectal cancer Neg Hx    Stomach cancer Neg Hx    Social History   Socioeconomic History   Marital status: Married    Spouse name: Annie Main   Number of children: 3   Years of education: BA degree   Highest education level: Not on file  Occupational History   Occupation: stay at home mom    Employer: UNEMPLOYED  Tobacco Use   Smoking status: Never   Smokeless tobacco: Never  Vaping Use   Vaping Use: Never used   Substance and Sexual Activity   Alcohol use: No    Alcohol/week: 0.0 standard drinks   Drug use: No   Sexual activity: Yes    Birth control/protection: None  Other Topics Concern   Not on file  Social History Narrative   Patient is married with 2 children.   Patient is right handed.   Patient has her  BA degree.   Patient drinks 1 cup daily.   Social Determinants of Health   Financial Resource Strain: Not on file  Food Insecurity: Not on file  Transportation Needs: Not on file  Physical Activity: Not on file  Stress: Not on file  Social Connections: Not on file   Past Surgical History:  Procedure Laterality Date   APPLICATION OF WOUND VAC N/A 05/25/2015   Procedure: APPLICATION OF WOUND VAC;  Surgeon: Michael Boston, MD;  Location: [facility];  Service: General;  Laterality: N/A;   CARDIAC CATHETERIZATION     CESAREAN SECTION  2012   COLONOSCOPY     CORONARY STENT INTERVENTION N/A 09/05/2020   Procedure: CORONARY STENT INTERVENTION;  Surgeon: Nigel Mormon, MD;  Location: [place] [facility];  Service: Cardiovascular;  Laterality:  N/A;   DIAGNOSTIC LAPAROSCOPY     ESOPHAGOGASTRODUODENOSCOPY N/A 06/03/2012   Procedure: ESOPHAGOGASTRODUODENOSCOPY (EGD);  Surgeon: [name] Craver, MD;  Location: [facility] ENDOSCOPY;  Service: Endoscopy;  Laterality: N/A;   EXCISION MASS ABDOMINAL N/A 05/25/2015   Procedure: ABDOMINAL WALL EXPLORATION EXCISION OF SEROMA REMOVAL OF REDUNDANT SKIN ;  Surgeon: Michael Boston, MD;  Location: [facility];  Service: General;  Laterality: N/A;   EYE SURGERY     Eye laser for vessel hemorrhaging   fybroid removal     HERNIA REPAIR  10/03/2011   ventral hernia repair   INSERTION OF MESH N/A 02/04/2013   Procedure: INSERTION OF MESH;  Surgeon: Adin Hector, MD;  Location: [facility];  Service: General;  Laterality: N/A;   INTRAVASCULAR ULTRASOUND/IVUS N/A 09/05/2020   Procedure: Intravascular Ultrasound/IVUS;  Surgeon: Nigel Mormon, MD;  Location: [place]  [facility];  Service: Cardiovascular;  Laterality: N/A;   LEFT HEART CATH AND CORONARY ANGIOGRAPHY N/A 09/05/2020   Procedure: LEFT HEART CATH AND CORONARY ANGIOGRAPHY;  Surgeon: Nigel Mormon, MD;  Location: [facility];  Service: Cardiovascular;  Laterality: N/A;   UMBILICAL HERNIA REPAIR N/A 02/04/2013   Procedure: LAPAROSCOPIC ventral wall hernia repair LAPAROSCOPIC LYSIS OF ADHESIONS laparoscopic exploration of abdomen ;  Surgeon: Adin Hector, MD;  Location: [facility];  Service: General;  Laterality: N/A;   UPPER GASTROINTESTINAL ENDOSCOPY     URETER REVISION     Bilateral "twisted"   UTERINE FIBROID SURGERY     2 SURGERIES FOR FIBROIDS   VENTRAL HERNIA REPAIR  10/03/2011   Procedure: LAPAROSCOPIC VENTRAL HERNIA;  Surgeon: Adin Hector, MD;  [facility];  Service: General;  Laterality: N/A;   Past Surgical History:  Procedure Laterality Date   APPLICATION OF WOUND VAC N/A 05/25/2015   Procedure: APPLICATION OF WOUND VAC;  Surgeon: Michael Boston, MD;  [facility];  Service: General;  Laterality: N/A;   CARDIAC CATHETERIZATION     CESAREAN SECTION  2012   COLONOSCOPY     CORONARY STENT INTERVENTION N/A 09/05/2020   Procedure: CORONARY STENT INTERVENTION;  Surgeon: Nigel Mormon, MD;  Location: [place] [facility];  Service: Cardiovascular;  Laterality: N/A;   DIAGNOSTIC LAPAROSCOPY     ESOPHAGOGASTRODUODENOSCOPY N/A 06/03/2012   Procedure: ESOPHAGOGASTRODUODENOSCOPY (EGD);  Surgeon: [name] Craver, MD;  Location: [facility];  Service: Endoscopy;  Laterality: N/A;   EXCISION MASS ABDOMINAL N/A 05/25/2015   Procedure: ABDOMINAL WALL EXPLORATION EXCISION OF SEROMA REMOVAL OF REDUNDANT SKIN ;  Surgeon: Michael Boston, MD;  [facility];  Service: General;  Laterality: N/A;   EYE SURGERY     Eye laser for vessel hemorrhaging   fybroid removal     HERNIA REPAIR  10/03/2011   ventral hernia repair   INSERTION OF MESH N/A 02/04/2013   Procedure: INSERTION OF  MESH;  Surgeon: Adin Hector, MD;  [facility]: [facility];  Service: General;  Laterality: N/A;   INTRAVASCULAR ULTRASOUND/IVUS N/A 09/05/2020   Procedure: Intravascular Ultrasound/IVUS;  Surgeon: Nigel Mormon, MD;  Location: [place] [facility];  Service: Cardiovascular;  Laterality: N/A;   LEFT HEART CATH AND CORONARY ANGIOGRAPHY N/A 09/05/2020   Procedure: LEFT HEART CATH AND CORONARY ANGIOGRAPHY;  Surgeon: Nigel Mormon, MD;  Location: [place] [facility];  Service: Cardiovascular;  Laterality: N/A;   UMBILICAL HERNIA REPAIR N/A 02/04/2013   Procedure: LAPAROSCOPIC ventral wall hernia repair LAPAROSCOPIC LYSIS OF ADHESIONS laparoscopic exploration of abdomen ;  Surgeon: Remo Lipps  C. Gross, MD;  Location: [facility];  Service: General;  Laterality: N/A;   UPPER GASTROINTESTINAL ENDOSCOPY     URETER REVISION     Bilateral "twisted"   UTERINE FIBROID SURGERY     2 SURGERIES FOR FIBROIDS   VENTRAL HERNIA REPAIR  10/03/2011   Procedure: LAPAROSCOPIC VENTRAL HERNIA;  Surgeon: Adin Hector, MD;  Location: [facility];  Service: General;  Laterality: N/A;   Past Medical History:  Diagnosis Date   Allergy    Anemia    DURING MENSES--HAS HEAVY BLEEDING WITH PERODS   Angioedema 08/13/2020   Anxiety    Back pain, chronic    "ongoing"   Blood transfusion    IN 2012  AFTER C -SECTION   Cerebral thrombosis with cerebral infarction ([place]) 06/2009   RIGHT SIDED WEAKNESS ( ARM AND LEG ) AND SPASMS-remains with slight weakness and vertigo.   Constipation    Depression    Diabetes mellitus    Diabetic neuropathy (HCC)    BOTH FEET --COMES AND GOES   Edema, lower extremity    Endometriosis    Fatty liver    GERD (gastroesophageal reflux disease)    with pregnancy   H/O eye surgery    Headache(784.0)    MIGRAINES--NOT REALLY HEADACHE-MORE LIKE PRESSURE SENSATION IN HEAD-FEELS DIZZIY AND  FAINT AS THE PRESSURE RESOLVES   Hernia, incisional, RLQ, s/p lap repair Sep 2013 09/02/2011   History of  vertigo 03/22/2018   Hx of migraines 10/19/2011   Hyperlipidemia    Hypertension    Leg pain, right    "like bad Charley horse"   Multiple food allergies    Panic disorder without agoraphobia    Rash    HANDS, ARMS --STATES HX OF RASH [place].  STATES THE RASH OFTEN OCCURS WHEN SHE IS REALLY STRESSED."goes and comes-presently left ring finger"   Restless leg syndrome    DIAGNOSED BY SLEEP STUDY - PT TOLD SHE DID NOT HAVE SLEEP APNEA   Right rotator cuff tear    PAIN IN RIGHT SHOULDER   SBO (small bowel obstruction) ([place]) 06/03/2012   Shortness of breath    Spastic hemiplegia affecting dominant side (HCC)    Stomach problems    Stroke (HCC)    Ventral hernia    RIGHT LOWER QUADRANT-CAUSING SOME PAIN   Weakness of right side of body    BP (!) 144/83    Pulse 81    Temp 99.3 F (37.4 C)    Ht 5\' 6"  (1.676 m)    Wt 245 lb 3.2 oz (111.2 kg)    SpO2 98%    BMI 39.58 kg/m   Opioid Risk Score:   Fall Risk Score:  `1  Depression screen PHQ 2/9  Depression screen [facility] 2/9 12/25/2020 11/09/2020 11/02/2020 10/30/2020 07/27/2019 02/09/2019 11/18/2018  Decreased Interest 1 3 0 1 0 0 0  Down, Depressed, Hopeless 1 3 1 1 1 1 1   PHQ - 2 Score 2 6 1 2 1 1 1   Altered sleeping - 3 - - - - -  Tired, decreased energy - 3 - - - - -  Change in appetite - 2 - - - - -  Feeling bad or failure about yourself  - 0 - - - - -  Trouble concentrating - 2 - - - - -  Moving slowly or fidgety/restless - 1 - - - - -  Suicidal thoughts - 0 - - - - -  PHQ-9 Score -  51 - - - - -  Difficult doing work/chores - Somewhat difficult - - - - -  Some recent data might be hidden     Review of Systems  Constitutional: Negative.   HENT: Negative.    Eyes: Negative.   Respiratory: Negative.    Cardiovascular: Negative.   Gastrointestinal: Negative.   Endocrine: Negative.   Genitourinary: Negative.   Musculoskeletal:        Right hip and feet  Skin: Negative.   Allergic/Immunologic:  Negative.   Neurological:        Burning  Hematological:  Bruises/bleeds easily.       Plavix  Psychiatric/Behavioral:  Positive for dysphoric mood.   All other systems reviewed and are negative.     Objective:   Physical Exam Vitals and nursing note reviewed.  Constitutional:      Appearance: She is obese.  HENT:     Head: Normocephalic and atraumatic.  Eyes:     Extraocular Movements: Extraocular movements intact.     Conjunctiva/sclera: Conjunctivae normal.     Pupils: Pupils are equal, round, and reactive to light.  Musculoskeletal:     Comments: Tender over the Right greater trochanter  Skin:    General: Skin is warm and dry.  Neurological:     Mental Status: She is alert and oriented to person, place, and time.     Sensory: No sensory deficit.  Psychiatric:        Mood and Affect: Mood normal.        Behavior: Behavior normal.   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

## 2020-12-25 NOTE — Patient Instructions (Signed)
Hip Bursitis Rehab Ask your health care provider which exercises are safe for you. Do exercises exactly as told by your health care provider and adjust them as directed. It is normal to feel mild stretching, pulling, tightness, or discomfort as you do these exercises. Stop right away if you feel sudden pain or your pain gets worse. Do not begin these exercises until told by your health care provider. Stretching exercise This exercise warms up your muscles and joints and improves the movement andflexibility of your hip. This exercise also helps to relieve pain and stiffness. Iliotibial band stretch An iliotibial band is a strong band of muscle tissue that runs from the outer side of your hip to the outer side of your thigh and knee. Lie on your side with your left / right leg in the top position. Bend your left / right knee and grab your ankle. Stretch out your bottom arm to help you balance. Slowly bring your knee back so your thigh is behind your body. Slowly lower your knee toward the floor until you feel a gentle stretch on the outside of your left / right thigh. If you do not feel a stretch and your knee will not fall farther, place the heel of your other foot on top of your knee and pull your knee down toward the floor with your foot. Hold this position for __________ seconds. Slowly return to the starting position. Repeat __________ times. Complete this exercise __________ times a day. Strengthening exercises These exercises build strength and endurance in your hip and pelvis. Enduranceis the ability to use your muscles for a long time, even after they get tired. Bridge This exercise strengthens the muscles that move your thigh backward (hip extensors). Lie on your back on a firm surface with your knees bent and your feet flat on the floor. Tighten your buttocks muscles and lift your buttocks off the floor until your trunk is level with your thighs. Do not arch your back. You should feel  the muscles working in your buttocks and the back of your thighs. If you do not feel these muscles, slide your feet 1-2 inches (2.5-5 cm) farther away from your buttocks. If this exercise is too easy, try doing it with your arms crossed over your chest. Hold this position for __________ seconds. Slowly lower your hips to the starting position. Let your muscles relax completely after each repetition. Repeat __________ times. Complete this exercise __________ times a day. Squats This exercise strengthens the muscles in front of your thigh and knee (quadriceps). Stand in front of a table, with your feet and knees pointing straight ahead. You may rest your hands on the table for balance but not for support. Slowly bend your knees and lower your hips like you are going to sit in a chair. Keep your weight over your heels, not over your toes. Keep your lower legs upright so they are parallel with the table legs. Do not let your hips go lower than your knees. Do not bend lower than told by your health care provider. If your hip pain increases, do not bend as low. Hold the squat position for __________ seconds. Slowly push with your legs to return to standing. Do not use your hands to pull yourself to standing. Repeat __________ times. Complete this exercise __________ times a day. Hip hike Stand sideways on a bottom step. Stand on your left / right leg with your other foot unsupported next to the step. You can hold on to   the railing or wall for balance if needed. Keep your knees straight and your torso square. Then lift your left / right hip up toward the ceiling. Hold this position for __________ seconds. Slowly let your left / right hip lower toward the floor, past the starting position. Your foot should get closer to the floor. Do not lean or bend your knees. Repeat __________ times. Complete this exercise __________ times a day. Single leg stand Without shoes, stand near a railing or in a  doorway. You may hold on to the railing or door frame as needed for balance. Squeeze your left / right buttock muscles, then lift up your other foot. Do not let your left / right hip push out to the side. It is helpful to stand in front of a mirror for this exercise so you can watch your hip. Hold this position for __________ seconds. Repeat __________ times. Complete this exercise __________ times a day. This information is not intended to replace advice given to you by your health care provider. Make sure you discuss any questions you have with your healthcare provider. Document Revised: 04/26/2018 Document Reviewed: 04/26/2018 [employer] Patient Education  2022 [employer]  

## 2020-12-26 ENCOUNTER — Encounter (HOSPITAL_COMMUNITY)
Admission: RE | Admit: 2020-12-26 | Discharge: 2020-12-26 | Disposition: A | Discharge status: Home / Self Care | Payer: Federal, State, Local not specified - PPO | Source: Ambulatory Visit | Attending: Cardiology | Admitting: Cardiology

## 2020-12-26 ENCOUNTER — Telehealth (HOSPITAL_COMMUNITY): Payer: Self-pay | Admitting: *Deleted

## 2020-12-26 ENCOUNTER — Encounter (HOSPITAL_COMMUNITY): Payer: Federal, State, Local not specified - PPO

## 2020-12-26 DIAGNOSIS — I214 Non-ST elevation (NSTEMI) myocardial infarction: Secondary | ICD-10-CM | POA: Diagnosis not present

## 2020-12-26 DIAGNOSIS — Z955 Presence of coronary angioplasty implant and graft: Secondary | ICD-10-CM | POA: Diagnosis not present

## 2020-12-26 LAB — GLUCOSE, CAPILLARY: Glucose-Capillary: 193 mg/dL — ABNORMAL HIGH (ref 70–99)

## 2020-12-26 NOTE — Progress Notes (Signed)
[facility] Incomplete Session Note  Patient Details  Name: KENDRA GRISSETT MRN: [id number] Date of Birth: 1970/03/02 Referring Provider:   Flowsheet Row CARDIAC REHAB PHASE II ORIENTATION from 11/01/2020 in [facility]  Referring Provider Dr. Rex Kras, DO       Yulonda Carmelia Roller did not complete her rehab session.  Miyanna reported to cardiac rehab today her lips are noted to be swollen. Patient denies shortness of breath.Oxygen saturations 97% on room air. Blood pressure 138/84.heart rate 72. Oxygen saturation 100% on room air. CBG 193. Patient's primary care physician Dr [name] office called and notified. I spoke with Danae Chen who spoke with the patient over the phone. The patient was told to take 50 mg of Benadryl when she gets home. Shawnie denied shortness of breath or throat swelling. Tenley left cardiac rehab without complaints or symptoms. The patient's husband was also called and notified about which he was already aware of. I advised Dee not to return to exercise at cardiac rehab until the swelling is resolved. Karena Addison will call when she gets home.[name], RN,BSN 12/26/2020 4:31 PM

## 2020-12-26 NOTE — Telephone Encounter (Signed)
Dee called to report that she made it home safely.[name], RN,BSN 12/26/2020 5:05 PM

## 2020-12-28 ENCOUNTER — Encounter (HOSPITAL_COMMUNITY): Payer: Federal, State, Local not specified - PPO

## 2020-12-28 NOTE — Progress Notes (Signed)
Please review the following results per Dr. Terri Skains.   Thank you.

## 2020-12-31 ENCOUNTER — Encounter (HOSPITAL_COMMUNITY): Payer: Federal, State, Local not specified - PPO

## 2020-12-31 ENCOUNTER — Telehealth (HOSPITAL_COMMUNITY): Payer: Self-pay | Admitting: *Deleted

## 2020-12-31 NOTE — Progress Notes (Signed)
Im sorry but Ms Vanrossum is no longer our patient.  Dr [name]

## 2020-12-31 NOTE — Telephone Encounter (Signed)
Patient's husband called to say that Tina [name] did not sleep well last night and is asleep now, therefore will be absent from cardiac rehab today.

## 2021-01-01 DIAGNOSIS — G4733 Obstructive sleep apnea (adult) (pediatric): Secondary | ICD-10-CM | POA: Diagnosis not present

## 2021-01-02 ENCOUNTER — Encounter (HOSPITAL_COMMUNITY)
Admission: RE | Admit: 2021-01-02 | Discharge: 2021-01-02 | Disposition: A | Discharge status: Home / Self Care | Payer: Federal, State, Local not specified - PPO | Source: Ambulatory Visit | Attending: Cardiology | Admitting: Cardiology

## 2021-01-02 ENCOUNTER — Other Ambulatory Visit: Payer: Self-pay

## 2021-01-02 ENCOUNTER — Telehealth: Payer: Self-pay

## 2021-01-02 VITALS — Ht 66.75 in | Wt 243.4 lb

## 2021-01-02 DIAGNOSIS — Z955 Presence of coronary angioplasty implant and graft: Secondary | ICD-10-CM

## 2021-01-02 DIAGNOSIS — I214 Non-ST elevation (NSTEMI) myocardial infarction: Secondary | ICD-10-CM | POA: Diagnosis not present

## 2021-01-02 DIAGNOSIS — E1122 Type 2 diabetes mellitus with diabetic chronic kidney disease: Secondary | ICD-10-CM | POA: Diagnosis not present

## 2021-01-02 DIAGNOSIS — E1169 Type 2 diabetes mellitus with other specified complication: Secondary | ICD-10-CM | POA: Diagnosis not present

## 2021-01-02 DIAGNOSIS — E1165 Type 2 diabetes mellitus with hyperglycemia: Secondary | ICD-10-CM | POA: Diagnosis not present

## 2021-01-02 NOTE — Progress Notes (Addendum)
Discharge Progress Report  Patient Details  Name: Tina [name] MRN: [id number] Date of Birth: 1970-01-16 Referring Provider:   Flowsheet Row CARDIAC REHAB PHASE II ORIENTATION from 11/01/2020 in [place]  Referring Provider Dr. Rex Kras, DO        Number of Visits: 8  Reason for Discharge:  Patient reached a stable level of exercise. Patient has met program and personal goals.  Smoking History:  Social History   Tobacco Use  Smoking Status Never  Smokeless Tobacco Never    Diagnosis:  09/05/20 NSTEMI (non-ST elevated myocardial infarction) ([facility])  09/05/20 S/P DES OM2  ADL [facility]:   Initial Exercise Prescription:  Initial Exercise Prescription - 11/01/20 1600       Date of Initial Exercise RX and Referring Provider   Date 11/01/20    Referring Provider Dr. Rex Kras, DO    Expected Discharge Date 12/28/20      NuStep   Level 1    SPM 75    Minutes 25    METs 1.9      Prescription Details   Frequency (times per week) 3    Duration Progress to 30 minutes of continuous aerobic without signs/symptoms of physical distress      Intensity   THRR 40-80% of Max Heartrate 68-135    Ratings of Perceived Exertion 11-13    Perceived Dyspnea 0-4      Progression   Progression Continue progressive overload as per policy without signs/symptoms or physical distress.      Resistance Training   Training Prescription Yes    Weight 2    Reps 10-15             Discharge Exercise Prescription (Final Exercise Prescription Changes):  Exercise Prescription Changes - 01/02/21 1652       Response to Exercise   Blood Pressure (Admit) 126/70    Blood Pressure (Exercise) 128/70    Blood Pressure (Exit) 110/76    Heart Rate (Admit) 97 bpm    Heart Rate (Exercise) 114 bpm    Heart Rate (Exit) 92 bpm    Rating of Perceived Exertion (Exercise) 12    Perceived Dyspnea (Exercise) 0    Symptoms 0    Comments Pt graduated the CRP2     Duration Progress to 30 minutes of  aerobic without signs/symptoms of physical distress    Intensity THRR unchanged      Progression   Progression Continue to progress workloads to maintain intensity without signs/symptoms of physical distress.    Average METs 2      Resistance Training   Training Prescription No      NuStep   Level 1    SPM 70    Minutes 25    METs 2      Home Exercise Plan   Plans to continue exercise at Home (comment)    Frequency Add 2 additional days to program exercise sessions.    Initial Home Exercises Provided 12/03/20             Functional Capacity:  6 Minute Walk     Row Name 11/01/20 1632 01/02/21 1638       6 Minute Walk   Phase Initial Discharge    Distance 1262 feet 1400 feet    Distance % Change -- 10.94 %    Distance Feet Change -- 138 ft    Walk Time 6 minutes 6 minutes    # of Rest Breaks 0  0    MPH 2.39 2.65    METS 3.31 3.69    RPE 15 12    Perceived Dyspnea  1 1    VO2 Peak 11.57 12.91    Symptoms Yes (comment) No    Comments headache 5/10 pain. Left side, hip, back sciatica 9/10 (both relieved with rest) --    Resting HR 82 bpm 97 bpm    Resting BP 130/80 128/70    Resting Oxygen Saturation  99 % 99 %    Exercise Oxygen Saturation  during 6 min walk 99 % 99 %    Max Ex. HR 96 bpm 114 bpm    Max Ex. BP 162/90 128/70    2 Minute Post BP 144/72 110/78             Psychological, QOL, Others - Outcomes: PHQ 2/9: Depression screen [facility] 2/9 01/02/2021 12/25/2020 11/09/2020 11/02/2020 10/30/2020  Decreased Interest 0 1 3 0 1  Down, Depressed, Hopeless 0 _0 PHQ - 2 Score 0 _1 Altered sleeping - - 3 - -  Tired, decreased energy - - 3 - -  Change in appetite - - 2 - -  Feeling bad or failure about yourself  - - 0 - -  Trouble concentrating - - 2 - -  Moving slowly or fidgety/restless - - 1 - -  Suicidal thoughts - - 0 - -  PHQ-9 Score - - 17 - -  Difficult doing work/chores - - Somewhat difficult -  -  Some recent data might be hidden    Quality of Life:  Quality of Life - 12/26/20 1630       Quality of Life   Select Quality of Life      Quality of Life Scores   Health/Function Post 21.17 %    Socioeconomic Post 27.56 %    Psych/Spiritual Post 23.14 %    Family Post 30 %    GLOBAL Post 24.29 %             Personal Goals: Goals established at orientation with interventions provided to work toward goal.  Personal Goals and Risk Factors at Admission - 11/02/20 0833       Core Components/Risk Factors/Patient Goals on Admission    Weight Management Yes;Obesity;Weight Loss    Intervention Weight Management: Develop a combined nutrition and exercise program designed to reach desired caloric intake, while maintaining appropriate intake of nutrient and fiber, sodium and fats, and appropriate energy expenditure required for the weight goal.;Weight Management: Provide education and appropriate resources to help participant work on and attain dietary goals.;Weight Management/Obesity: Establish reasonable short term and long term weight goals.;Obesity: Provide education and appropriate resources to help participant work on and attain dietary goals.    Admit Weight 256 lb 2.8 oz (116.2 kg)    Expected Outcomes Short Term: Continue to assess and modify interventions until short term weight is achieved;Long Term: Adherence to nutrition and physical activity/exercise program aimed toward attainment of established weight goal;Weight Loss: Understanding of general recommendations for a balanced deficit meal plan, which promotes 1-2 lb weight loss per week and includes a negative energy balance of [phone] kcal/d;Understanding recommendations for meals to include 15-35% energy as protein, 25-35% energy from fat, 35-60% energy from carbohydrates, less than 248m of dietary cholesterol, 20-35 gm of total fiber daily;Understanding of distribution of calorie intake throughout the day with the  consumption of 4-5 meals/snacks    Diabetes Yes  Intervention Provide education about signs/symptoms and action to take for hypo/hyperglycemia.;Provide education about proper nutrition, including hydration, and aerobic/resistive exercise prescription along with prescribed medications to achieve blood glucose in normal ranges: Fasting glucose 65-99 mg/dL    Expected Outcomes Short Term: Participant verbalizes understanding of the signs/symptoms and immediate care of hyper/hypoglycemia, proper foot care and importance of medication, aerobic/resistive exercise and nutrition plan for blood glucose control.;Long Term: Attainment of HbA1C < 7%.    Heart Failure Yes    Intervention Provide a combined exercise and nutrition program that is supplemented with education, support and counseling about heart failure. Directed toward relieving symptoms such as shortness of breath, decreased exercise tolerance, and extremity edema.    Expected Outcomes Short term: Attendance in program 2-3 days a week with increased exercise capacity. Reported lower sodium intake. Reported increased fruit and vegetable intake. Reports medication compliance.;Short term: Daily weights obtained and reported for increase. Utilizing diuretic protocols set by physician.;Long term: Adoption of self-care skills and reduction of barriers for early signs and symptoms recognition and intervention leading to self-care maintenance.    Hypertension Yes    Intervention Provide education on lifestyle modifcations including regular physical activity/exercise, weight management, moderate sodium restriction and increased consumption of fresh fruit, vegetables, and low fat dairy, alcohol moderation, and smoking cessation.;Monitor prescription use compliance.    Expected Outcomes Short Term: Continued assessment and intervention until BP is < 140/41m HG in hypertensive participants. < 130/866mHG in hypertensive participants with diabetes, heart failure or  chronic kidney disease.;Long Term: Maintenance of blood pressure at goal levels.    Lipids Yes    Intervention Provide education and support for participant on nutrition & aerobic/resistive exercise along with prescribed medications to achieve LDL <7061mHDL >54m84m  Expected Outcomes Short Term: Participant states understanding of desired cholesterol values and is compliant with medications prescribed. Participant is following exercise prescription and nutrition guidelines.;Long Term: Cholesterol controlled with medications as prescribed, with individualized exercise RX and with personalized nutrition plan. Value goals: LDL < 70mg18mL > 40 mg.    Stress Yes    Intervention Offer individual and/or small group education and counseling on adjustment to heart disease, stress management and health-related lifestyle change. Teach and support self-help strategies.;Refer participants experiencing significant psychosocial distress to appropriate mental health specialists for further evaluation and treatment. When possible, include family members and significant others in education/counseling sessions.    Expected Outcomes Short Term: Participant demonstrates changes in health-related behavior, relaxation and other stress management skills, ability to obtain effective social support, and compliance with psychotropic medications if prescribed.;Long Term: Emotional wellbeing is indicated by absence of clinically significant psychosocial distress or social isolation.    Personal Goal Other Yes    Personal Goal Short term: walk longer without SOB and CP Long term: lose wt and gain endurance    Intervention Will continue to monitor pt and progress workloads as tolerated without sign or symptom    Expected Outcomes Pt will achieve her goals              Personal Goals Discharge:  Goals and Risk Factor Review     Row Name 11/13/20 1439 12/12/20 1633 01/10/21 1444         Core Components/Risk  Factors/Patient Goals Review   Personal Goals Review Weight Management/Obesity;Hypertension;Lipids;Diabetes;Stress Weight Management/Obesity;Hypertension;Lipids;Diabetes;Stress Weight Management/Obesity;Hypertension;Lipids;Diabetes;Stress     Review Shanina started exercise at cardiac rehab on 11/09/20 and is deconditioned. VSS. CBG's Stable. Shemica reported having sciatica pain. Aries is looking  forward to participating in the program. Jazma does well with exercise when able to attend and reports feeling stronger since participation. Jakera continues to have variations with her CBG's and blood pressures will continuen to monitor. Amrita completed phase 2 cardiac rehab on 01/02/21. Dyreist completed the program so that she could go on vacation with her family during the holidays.     Expected Outcomes Ethelmae will continue to participate in phase 2 cardiac rehab for exercise, nutrition and lifestyle modifications. Marithza will continue to participate in phase 2 cardiac rehab for exercise, nutrition and lifestyle modifications. Floy will continue to  exercise, nutrition and lifestyle modifications upon completion of phase 2 cardiac rehab.              Exercise Goals and Review:  Exercise Goals     Row Name 11/01/20 1643             Exercise Goals   Increase Physical Activity Yes       Intervention Provide advice, education, support and counseling about physical activity/exercise needs.;Develop an individualized exercise prescription for aerobic and resistive training based on initial evaluation findings, risk stratification, comorbidities and participant's personal goals.       Expected Outcomes Short Term: Attend rehab on a regular basis to increase amount of physical activity.;Long Term: Add in home exercise to make exercise part of routine and to increase amount of physical activity.;Long Term: Exercising regularly at least 3-5 days a week.       Increase Strength and Stamina Yes        Intervention Provide advice, education, support and counseling about physical activity/exercise needs.;Develop an individualized exercise prescription for aerobic and resistive training based on initial evaluation findings, risk stratification, comorbidities and participant's personal goals.       Expected Outcomes Short Term: Increase workloads from initial exercise prescription for resistance, speed, and METs.;Short Term: Perform resistance training exercises routinely during rehab and add in resistance training at home;Long Term: Improve cardiorespiratory fitness, muscular endurance and strength as measured by increased METs and functional capacity (6MWT)       Able to understand and use rate of perceived exertion (RPE) scale Yes       Intervention Provide education and explanation on how to use RPE scale       Expected Outcomes Short Term: Able to use RPE daily in rehab to express subjective intensity level;Long Term:  Able to use RPE to guide intensity level when exercising independently       Able to understand and use Dyspnea scale Yes       Intervention Provide education and explanation on how to use Dyspnea scale       Expected Outcomes Short Term: Able to use Dyspnea scale daily in rehab to express subjective sense of shortness of breath during exertion;Long Term: Able to use Dyspnea scale to guide intensity level when exercising independently       Knowledge and understanding of Target Heart Rate Range (THRR) Yes       Intervention Provide education and explanation of THRR including how the numbers were predicted and where they are located for reference       Expected Outcomes Short Term: Able to state/look up THRR;Long Term: Able to use THRR to govern intensity when exercising independently;Short Term: Able to use daily as guideline for intensity in rehab       Understanding of Exercise Prescription Yes       Intervention Provide education, explanation, and written materials on  patient's individual exercise prescription       Expected Outcomes Short Term: Able to explain program exercise prescription;Long Term: Able to explain home exercise prescription to exercise independently                Exercise Goals Re-Evaluation:  Exercise Goals Re-Evaluation     Row Name 11/09/20 1600 11/16/20 1013 12/03/20 1622 12/21/20 1611 01/02/21 1647     Exercise Goal Re-Evaluation   Exercise Goals Review Increase Physical Activity;Increase Strength and Stamina;Able to understand and use rate of perceived exertion (RPE) scale;Knowledge and understanding of Target Heart Rate Range (THRR);Understanding of Exercise Prescription -- Increase Physical Activity;Increase Strength and Stamina;Able to understand and use rate of perceived exertion (RPE) scale;Knowledge and understanding of Target Heart Rate Range (THRR);Understanding of Exercise Prescription Increase Physical Activity;Increase Strength and Stamina;Able to understand and use rate of perceived exertion (RPE) scale;Knowledge and understanding of Target Heart Rate Range (THRR);Understanding of Exercise Prescription Increase Physical Activity;Increase Strength and Stamina;Able to understand and use rate of perceived exertion (RPE) scale;Knowledge and understanding of Target Heart Rate Range (THRR);Understanding of Exercise Prescription   Comments Pt first day in the CRP2 program. Pt tolerated fair with chronic groin/siatica pain 7/10, and low CBG post exercise at 102, ginger ale given. Pt average MET level of 1.7. Pt is learning her THRR, RPE and exercise Rx. -- Reviewed MET's goals and home ex rx. Pt is tolerating exercise well with an average MET level of 1.7. Pt feels like she is advancing towards her goals, but still has trouble with SOB, she is hoing to lose some wt and become less SOB. Pt will continue to exercise by walking and doing stretched 1-2 additional days at home for 30-45 minutes per session Reviewed MET's and goals. Pt has  ben absent due to GI illness. pt still not feeling well and not eating much so we had education and she did not exercise today. Pt average MET level is 1.8 on her previous visit. Pt feels she has been stunted in reaching her goals due to illness, but is looking forward to starting back with exercise next week. Pt graduated the [facility] program with an average MET level of 2.0. Pt will continue to exercise on her own 4 days a week for 20-30 minutes per session by walking, doing pain management exercises and possibly [facility] PREP program.   Expected Outcomes Will continue to monitor pt and progress workloads as tolerated. -- Will continue to monitor pt and progress workloads as tolerated. Pt will continue to exercise on her own at home Will continue to monitor pt and progress workloads as tolerated. Pt will continue to exercise on her own at home 4 days a week for 20-30 minutes per session.            Nutrition & Weight - Outcomes:  Pre Biometrics - 11/01/20 1631       Pre Biometrics   Waist Circumference 50 inches    Hip Circumference 46 inches    Waist to Hip Ratio 1.09 %    Triceps Skinfold 52 mm    % Body Fat 52.7 %    Grip Strength 25 kg    Flexibility 10 in    Single Leg Stand 30 seconds             Post Biometrics - 01/02/21 1650        Post  Biometrics   Height 5' 6.75" (1.695 m)    Weight 110.4 kg    Waist  Circumference 49 inches    Hip Circumference 46.5 inches    Waist to Hip Ratio 1.05 %    BMI (Calculated) 38.43    Triceps Skinfold 55 mm    % Body Fat 51.6 %    Grip Strength 24 kg    Flexibility 10.5 in    Single Leg Stand 30 seconds             Nutrition:   Nutrition Discharge:   Education Questionnaire Score:  Knowledge Questionnaire Score - 12/26/20 1630       Knowledge Questionnaire Score   Post Score 21/24             Goals reviewed with patient; copy given to patient. Caylin graduated from cardiac rehab program today with completion of 8  exercise sessions in Phase II. Pt maintained fair  attendance and progressed nicely during her participation in rehab as evidenced by increased MET level. Taryne increased her distance on her post exercise walk test by 138 feet.  Medication list reconciled. Repeat  PHQ score- 0 .  Pt has made significant lifestyle changes and should be commended for her success. Pt feels she has achieved her goals during cardiac rehab.   Pt plans to continue exercise by walking and may participate in the prep program at the [facility]. Contact information given per patient's request. Angelik reports feeling stronger since participating in phase 2 cardiac rehab. We are proud of Dyreist progress!  Harrell Gave RN BSN

## 2021-01-02 NOTE — Telephone Encounter (Signed)
Call from [place] at Cardiac Rehab Patient finishing up with their program and referring pt to me Pt will be traveling for the holidays. Will attempt to connect with her prior to her traveling to explain PREP

## 2021-01-03 ENCOUNTER — Telehealth: Payer: Self-pay

## 2021-01-03 NOTE — Telephone Encounter (Signed)
Call to pt reference PREP referral from Cardiac Rehab  Explained program to pt as we as times/locations of classes Would like evening class at [facility] Next one will start beginning of March Will call pt back at start of next evening class for intake

## 2021-01-16 DIAGNOSIS — Z0000 Encounter for general adult medical examination without abnormal findings: Secondary | ICD-10-CM | POA: Diagnosis not present

## 2021-01-18 NOTE — Progress Notes (Signed)
Called patient no answer left a vm

## 2021-01-22 NOTE — Progress Notes (Signed)
Reached patient was about to confirm PCP all sugar readings have been fax to PCP

## 2021-01-22 NOTE — Progress Notes (Signed)
These readings have been fax to PCP

## 2021-01-24 ENCOUNTER — Telehealth: Payer: Self-pay

## 2021-01-24 NOTE — Telephone Encounter (Signed)
Forms were faxed from [place] for Tizanidine and Tramadol. Forms place in box for provider to sign

## 2021-01-25 ENCOUNTER — Other Ambulatory Visit: Payer: Self-pay

## 2021-01-25 MED ORDER — ISOSORBIDE MONONITRATE ER 60 MG PO TB24
60.0000 mg | ORAL_TABLET | Freq: Every day | ORAL | 3 refills | Status: DC
Start: 2021-01-25 — End: 2021-02-18

## 2021-01-25 MED ORDER — HYDRALAZINE HCL 10 MG PO TABS: ORAL_TABLET | ORAL | 3 refills | Status: DC

## 2021-01-25 MED ORDER — CLOPIDOGREL BISULFATE 75 MG PO TABS: 75.0000 mg | ORAL_TABLET | Freq: Every day | ORAL | 3 refills | Status: DC

## 2021-01-28 ENCOUNTER — Other Ambulatory Visit: Payer: Self-pay | Admitting: *Deleted

## 2021-01-28 MED ORDER — TRAMADOL HCL 50 MG PO TABS: 50.0000 mg | ORAL_TABLET | Freq: Two times a day (BID) | ORAL | 2 refills | Status: DC

## 2021-01-28 MED ORDER — TIZANIDINE HCL 2 MG PO TABS: 2.0000 mg | ORAL_TABLET | Freq: Every day | ORAL | 1 refills | Status: DC

## 2021-01-29 ENCOUNTER — Telehealth: Payer: Self-pay

## 2021-01-29 NOTE — Telephone Encounter (Signed)
[facility] called requesting clarification for the hydralazine. It said take 1 tablet with food. In the chart, it states that the patient should take it 3 times daily. Which one is correct? Please advise.

## 2021-01-30 NOTE — Telephone Encounter (Signed)
Based on the last office note it should be three times a day.  Unless it was changed by another provider in the interim. Please reconcile with the patient.   Dr. Terri Skains

## 2021-01-31 NOTE — Telephone Encounter (Signed)
Attempted to call pt, no answer. VM box was full, unable to leave message. Will try again later.

## 2021-02-01 ENCOUNTER — Other Ambulatory Visit: Payer: Self-pay

## 2021-02-01 DIAGNOSIS — G4733 Obstructive sleep apnea (adult) (pediatric): Secondary | ICD-10-CM | POA: Diagnosis not present

## 2021-02-01 MED ORDER — HYDRALAZINE HCL 10 MG PO TABS: 10.0000 mg | ORAL_TABLET | Freq: Three times a day (TID) | ORAL | 0 refills | Status: DC

## 2021-02-01 NOTE — Telephone Encounter (Signed)
Called and spoke to pt, pt stated she is taking it as directed by Dr. Terri Skains. Adjusted Hydralazine to one tablet TID and sent to pharmacy.

## 2021-02-02 ENCOUNTER — Other Ambulatory Visit: Payer: Self-pay | Admitting: Cardiology

## 2021-02-02 DIAGNOSIS — E1165 Type 2 diabetes mellitus with hyperglycemia: Secondary | ICD-10-CM | POA: Diagnosis not present

## 2021-02-02 DIAGNOSIS — E1122 Type 2 diabetes mellitus with diabetic chronic kidney disease: Secondary | ICD-10-CM | POA: Diagnosis not present

## 2021-02-02 DIAGNOSIS — E1169 Type 2 diabetes mellitus with other specified complication: Secondary | ICD-10-CM | POA: Diagnosis not present

## 2021-02-12 DIAGNOSIS — I10 Essential (primary) hypertension: Secondary | ICD-10-CM | POA: Diagnosis not present

## 2021-02-12 DIAGNOSIS — E785 Hyperlipidemia, unspecified: Secondary | ICD-10-CM | POA: Diagnosis not present

## 2021-02-12 DIAGNOSIS — E1142 Type 2 diabetes mellitus with diabetic polyneuropathy: Secondary | ICD-10-CM | POA: Diagnosis not present

## 2021-02-12 DIAGNOSIS — E1122 Type 2 diabetes mellitus with diabetic chronic kidney disease: Secondary | ICD-10-CM | POA: Diagnosis not present

## 2021-02-18 ENCOUNTER — Ambulatory Visit: Payer: Federal, State, Local not specified - PPO | Admitting: Cardiology

## 2021-02-18 ENCOUNTER — Other Ambulatory Visit: Payer: Self-pay

## 2021-02-18 ENCOUNTER — Other Ambulatory Visit: Payer: Self-pay | Admitting: *Deleted

## 2021-02-18 ENCOUNTER — Other Ambulatory Visit: Payer: Self-pay | Admitting: Physical Medicine & Rehabilitation

## 2021-02-18 ENCOUNTER — Encounter: Payer: Self-pay | Admitting: Cardiology

## 2021-02-18 VITALS — BP 132/80 | HR 84 | Temp 97.3°F | Resp 16 | Ht 66.0 in | Wt 242.0 lb

## 2021-02-18 DIAGNOSIS — I6930 Unspecified sequelae of cerebral infarction: Secondary | ICD-10-CM

## 2021-02-18 DIAGNOSIS — I2510 Atherosclerotic heart disease of native coronary artery without angina pectoris: Secondary | ICD-10-CM | POA: Diagnosis not present

## 2021-02-18 DIAGNOSIS — I5189 Other ill-defined heart diseases: Secondary | ICD-10-CM

## 2021-02-18 DIAGNOSIS — Z6839 Body mass index (BMI) 39.0-39.9, adult: Secondary | ICD-10-CM

## 2021-02-18 DIAGNOSIS — I252 Old myocardial infarction: Secondary | ICD-10-CM | POA: Diagnosis not present

## 2021-02-18 DIAGNOSIS — E1169 Type 2 diabetes mellitus with other specified complication: Secondary | ICD-10-CM | POA: Diagnosis not present

## 2021-02-18 DIAGNOSIS — Z955 Presence of coronary angioplasty implant and graft: Secondary | ICD-10-CM

## 2021-02-18 DIAGNOSIS — I10 Essential (primary) hypertension: Secondary | ICD-10-CM | POA: Diagnosis not present

## 2021-02-18 DIAGNOSIS — E1140 Type 2 diabetes mellitus with diabetic neuropathy, unspecified: Secondary | ICD-10-CM

## 2021-02-18 DIAGNOSIS — Z794 Long term (current) use of insulin: Secondary | ICD-10-CM

## 2021-02-18 MED ORDER — ISOSORBIDE MONONITRATE ER 60 MG PO TB24
60.0000 mg | ORAL_TABLET | Freq: Every day | ORAL | 3 refills | Status: DC
Start: 2021-02-18 — End: 2021-05-22

## 2021-02-18 MED ORDER — CLOPIDOGREL BISULFATE 75 MG PO TABS: 75.0000 mg | ORAL_TABLET | Freq: Every day | ORAL | 3 refills | Status: DC

## 2021-02-18 MED ORDER — HYDRALAZINE HCL 10 MG PO TABS: 10.0000 mg | ORAL_TABLET | Freq: Three times a day (TID) | ORAL | 3 refills | Status: DC

## 2021-02-18 MED ORDER — TIZANIDINE HCL 2 MG PO TABS: 2.0000 mg | ORAL_TABLET | Freq: Every day | ORAL | 1 refills | Status: DC

## 2021-02-18 NOTE — Progress Notes (Signed)
Date:  02/18/2021   ID:  Tina [name], DOB 12/12/1970, MRN [id number]  PCP:  [name], DO  Cardiologist:  Rex Kras, DO, [facility] (established care 04/07/2019) Former cardiology providers: Jeri Lager, APRN, FNP-C  Date: 02/18/21 Last Office Visit: 11/16/2020   Chief Complaint  Patient presents with   Coronary Artery Disease   Follow-up    HPI  Tina [name] is a 51 y.o. African-American female who presents to the office with a chief complaint of " 37-monthfollow-up for CAD management." Patient's past medical history and cardiovascular risk factors include: OSA on CPAP, hypertension, insulin-dependent diabetes mellitus, h/o stroke 2011, obesity, NSTEMI treated with OM2 PCI 09/05/2020, hyperlipidemia, obesity due to excess calories.  In August 2022 patient presented with chest pain and ruled in for non-STEMI.  Invasive angiography noted obstructive CAD in the OM2 distribution underwent PCI and is currently on dual antiplatelet therapy.  Since the last office visit patient has done remarkably well from a cardiovascular standpoint.  She has lost 19 pounds due to lifestyle changes and increasing physical activity as tolerated.  Her blood pressures are also very well controlled at home per patient with SBP between 129-135 mmHg and DBP 70-80 mmHg.  At times experiences lightheaded and dizziness but no syncope.  Patient states that her sugars are also improving.  No hospitalizations or urgent care visits for cardiovascular symptoms since the last office encounter.  She has completed cardiac rehab.   FUNCTIONAL STATUS: She works out approximately 15 minutes at home.  ALLERGIES: Allergies  Allergen Reactions   Amlodipine Benzoate Swelling and Other (See Comments)    Leg swelling Other reaction(s): leg swelling   Contrast Media [Iodinated Contrast Media] Shortness Of Breath and Other (See Comments)    Difficulty breathing   Iodine Anaphylaxis    Other reaction(s): Unknown    Iohexol Hives, Nausea And Vomiting, Swelling and Other (See Comments)     Desc: Magnevist-gadolinium-difficulty breathing, throat swelling    Midazolam Hcl Anaphylaxis    Difficulty breathing   Other Shortness Of Breath, Itching and Other (See Comments)    Patient is allergic to all nuts except peanuts, which are NOT "nuts"- they are legumes   Shellfish Allergy Anaphylaxis   Valsartan Swelling    Other reaction(s): Unknown   Metformin And Related Diarrhea, Nausea And Vomiting and Other (See Comments)    Dehydration, also   FIran[Dapagliflozin] Nausea Only and Other (See Comments)    UTI, headaches and muscle aches, also   Hydralazine Other (See Comments)    Reaction not recalled Other reaction(s): Unknown   Saxagliptin Hives, Nausea And Vomiting and Other (See Comments)    ONGLYZA- dehydration, also Other reaction(s): Unknown   Spironolactone Other (See Comments)    Elevated potassium- Severe hyperkalemia Other reaction(s): severe hyperkalemia   Avandia [Rosiglitazone Maleate] Hives and Other (See Comments)   Geodon [Ziprasidone] Other (See Comments)    Reaction not recalled   Kiwi Extract Itching and Swelling   Latex Itching    MEDICATION LIST PRIOR TO VISIT: Current Meds  Medication Sig   albuterol (VENTOLIN HFA) 108 (90 Base) MCG/ACT inhaler Inhale 2 puffs into the lungs every 4 (four) hours as needed for wheezing or shortness of breath.   allopurinol (ZYLOPRIM) 100 MG tablet Take 100 mg by mouth at bedtime.   amLODipine (NORVASC) 5 MG tablet Take 1 tablet (5 mg total) by mouth daily.   aspirin EC 81 MG tablet Take 81 mg by mouth at bedtime. Swallow  whole.   atorvastatin (LIPITOR) 40 MG tablet Take 1 tablet (40 mg total) by mouth at bedtime.   carvedilol (COREG) 25 MG tablet Take 25 mg by mouth 2 (two) times daily.   cetirizine (ZYRTEC) 10 MG tablet Take 10 mg by mouth daily as needed for allergies (itching).   cloNIDine (CATAPRES) 0.2 MG tablet Take 0.4 mg by mouth 2  (two) times daily.   clopidogrel (PLAVIX) 75 MG tablet Take 1 tablet (75 mg total) by mouth daily.   Continuous Blood Gluc Sensor (FREESTYLE LIBRE 2 SENSOR) MISC Inject 1 Device into the skin every 14 (fourteen) days.   DULoxetine (CYMBALTA) 20 MG capsule Take 20-40 mg by mouth See admin instructions. Take 20 mg by mouth in the morning and 40 mg at bedtime   EPINEPHrine (AUVI-Q) 0.3 mg/0.3 mL IJ SOAJ injection Inject 0.3 mg into the muscle as needed for anaphylaxis.   furosemide (LASIX) 40 MG tablet Take 40 mg by mouth in the morning.   gabapentin (NEURONTIN) 300 MG capsule Take 300 mg by mouth at bedtime.   hydrALAZINE (APRESOLINE) 10 MG tablet Take 1 tablet (10 mg total) by mouth 3 (three) times daily.   Insulin Aspart FlexPen (NOVOLOG) 100 UNIT/ML Inject 0-25 Units into the skin 3 (three) times daily before meals. Dose per sliding scale.   Insulin Glargine (BASAGLAR KWIKPEN) 100 UNIT/ML Inject 22 Units into the skin at bedtime. (Patient taking differently: Inject 18 Units into the skin at bedtime.)   isosorbide mononitrate (IMDUR) 60 MG 24 hr tablet Take 1 tablet (60 mg total) by mouth daily.   JARDIANCE 10 MG TABS tablet Take 5-10 mg by mouth daily.   OZEMPIC, 2 MG/DOSE, 8 MG/3ML SOPN Inject 2 mg into the skin once a week.   tiZANidine (ZANAFLEX) 2 MG tablet Take 1 tablet (2 mg total) by mouth at bedtime.   traMADol (ULTRAM) 50 MG tablet Take 1 tablet (50 mg total) by mouth 2 (two) times daily.     PAST MEDICAL HISTORY: Past Medical History:  Diagnosis Date   Allergy    Anemia    DURING MENSES--HAS HEAVY BLEEDING WITH PERODS   Angioedema 08/13/2020   Anxiety    Back pain, chronic    "ongoing"   Blood transfusion    IN 2012  AFTER C -SECTION   Cerebral thrombosis with cerebral infarction ([place]) 06/2009   RIGHT SIDED WEAKNESS ( ARM AND LEG ) AND SPASMS-remains with slight weakness and vertigo.   Constipation    Depression    Diabetes mellitus    Diabetic neuropathy (HCC)    BOTH  FEET --COMES AND GOES   Edema, lower extremity    Endometriosis    Fatty liver    GERD (gastroesophageal reflux disease)    with pregnancy   H/O eye surgery    Headache(784.0)    MIGRAINES--NOT REALLY HEADACHE-MORE LIKE PRESSURE SENSATION IN HEAD-FEELS DIZZIY AND  FAINT AS THE PRESSURE RESOLVES   Hernia, incisional, RLQ, s/p lap repair Sep 2013 09/02/2011   History of vertigo 03/22/2018   Hx of migraines 10/19/2011   Hyperlipidemia    Hypertension    Leg pain, right    "like bad Charley horse"   Multiple food allergies    Panic disorder without agoraphobia    Rash    HANDS, ARMS --STATES HX OF RASH [place].  STATES THE RASH OFTEN OCCURS WHEN SHE IS REALLY STRESSED."goes and comes-presently left ring finger"   Restless leg syndrome    DIAGNOSED  BY SLEEP STUDY - PT TOLD SHE DID NOT HAVE SLEEP APNEA   Right rotator cuff tear    PAIN IN RIGHT SHOULDER   SBO (small bowel obstruction) ([place]) 06/03/2012   Shortness of breath    Spastic hemiplegia affecting dominant side (HCC)    Stomach problems    Stroke (HCC)    Ventral hernia    RIGHT LOWER QUADRANT-CAUSING SOME PAIN   Weakness of right side of body     PAST SURGICAL HISTORY: Past Surgical History:  Procedure Laterality Date   APPLICATION OF WOUND VAC N/A 05/25/2015   Procedure: APPLICATION OF WOUND VAC;  Surgeon: Michael Boston, MD;  Location: [facility];  Service: General;  Laterality: N/A;   CARDIAC CATHETERIZATION     CESAREAN SECTION  2012   COLONOSCOPY     CORONARY STENT INTERVENTION N/A 09/05/2020   Procedure: CORONARY STENT INTERVENTION;  Surgeon: Nigel Mormon, MD;  Location: [place] [facility];  Service: Cardiovascular;  Laterality: N/A;   DIAGNOSTIC LAPAROSCOPY     ESOPHAGOGASTRODUODENOSCOPY N/A 06/03/2012   Procedure: ESOPHAGOGASTRODUODENOSCOPY (EGD);  Surgeon: [name] Craver, MD;  Location: [facility];  Service: Endoscopy;  Laterality: N/A;   EXCISION MASS ABDOMINAL N/A 05/25/2015    Procedure: ABDOMINAL WALL EXPLORATION EXCISION OF SEROMA REMOVAL OF REDUNDANT SKIN ;  Surgeon: Michael Boston, MD;  Location: [facility];  Service: General;  Laterality: N/A;   EYE SURGERY     Eye laser for vessel hemorrhaging   fybroid removal     HERNIA REPAIR  10/03/2011   ventral hernia repair   INSERTION OF MESH N/A 02/04/2013   Procedure: INSERTION OF MESH;  Surgeon: Adin Hector, MD;  Location: [facility];  Service: General;  Laterality: N/A;   INTRAVASCULAR ULTRASOUND/IVUS N/A 09/05/2020   Procedure: Intravascular Ultrasound/IVUS;  Surgeon: Nigel Mormon, MD;  Location: [place] [facility];  Service: Cardiovascular;  Laterality: N/A;   LEFT HEART CATH AND CORONARY ANGIOGRAPHY N/A 09/05/2020   Procedure: LEFT HEART CATH AND CORONARY ANGIOGRAPHY;  Surgeon: Nigel Mormon, MD;  Location: [place] [facility];  Service: Cardiovascular;  Laterality: N/A;   UMBILICAL HERNIA REPAIR N/A 02/04/2013   Procedure: LAPAROSCOPIC ventral wall hernia repair LAPAROSCOPIC LYSIS OF ADHESIONS laparoscopic exploration of abdomen ;  Surgeon: Adin Hector, MD;  Location: [facility];  Service: General;  Laterality: N/A;   UPPER GASTROINTESTINAL ENDOSCOPY     URETER REVISION     Bilateral "twisted"   UTERINE FIBROID SURGERY     2 SURGERIES FOR FIBROIDS   VENTRAL HERNIA REPAIR  10/03/2011   Procedure: LAPAROSCOPIC VENTRAL HERNIA;  Surgeon: Adin Hector, MD;  Location: [facility];  Service: General;  Laterality: N/A;    FAMILY HISTORY: The patient family history includes Alcoholism in her mother; Allergic rhinitis in her mother; Anxiety disorder in her mother; Bipolar disorder in her mother; Colon cancer in her paternal uncle; Depression in her father and mother; Diabetes in her father and maternal grandmother; Drug abuse in her father and mother; Eating disorder in her mother; Eczema in her mother and sister; Hyperlipidemia in her paternal grandmother; Kidney disease in her father; Stroke in her paternal  grandmother; Urticaria in her mother and sister.  SOCIAL HISTORY:  The patient  reports that she has never smoked. She has never used smokeless tobacco. She reports that she does not drink alcohol and does not use drugs.  REVIEW OF SYSTEMS: Review of Systems  Constitutional: Positive for weight loss. Negative for chills, fever and  weight gain.  HENT:  Negative for hoarse voice and nosebleeds.   Eyes:  Negative for discharge, double vision and pain.  Cardiovascular:  Positive for dyspnea on exertion (chronic, improved). Negative for chest pain, claudication, leg swelling, near-syncope, orthopnea, palpitations, paroxysmal nocturnal dyspnea and syncope.  Respiratory:  Positive for shortness of breath (Chronic, improving). Negative for hemoptysis.   Musculoskeletal:  Negative for muscle cramps and myalgias.  Gastrointestinal:  Negative for abdominal pain, constipation, diarrhea, hematemesis, hematochezia, melena, nausea and vomiting.  Neurological:  Negative for dizziness and light-headedness.   PHYSICAL EXAM: Vitals with BMI 02/18/2021 01/02/2021 12/25/2020  Height '5\' 6"'  5' 6.75" '5\' 6"'   Weight 242 lbs 243 lbs 6 oz 245 lbs 3 oz  BMI 39.08 88.11 03.1  Systolic 594 - 585  Diastolic 80 - 83  Pulse 84 - 81    CONSTITUTIONAL: Well-developed and well-nourished. No acute distress.  SKIN: Skin is warm and dry. No rash noted. No cyanosis. No pallor. No jaundice HEAD: Normocephalic and atraumatic.  EYES: No scleral icterus MOUTH/THROAT: Moist oral membranes.  NECK: No JVD present. No thyromegaly noted. No carotid bruits  LYMPHATIC: No visible cervical adenopathy.  CHEST Normal respiratory effort. No intercostal retractions  LUNGS: Clear to auscultation bilaterally.  No stridor. No wheezes. No rales.  CARDIOVASCULAR: Regular rate and rhythm, positive S1-S2, no murmurs rubs or gallops appreciated. ABDOMINAL: Obese, soft, nontender, nondistended, positive bowel sounds in all 4 quadrants, no apparent  ascites.  EXTREMITIES: No peripheral edema, warm to touch. HEMATOLOGIC: No significant bruising NEUROLOGIC: Oriented to person, place, and time. Nonfocal. Normal muscle tone.  PSYCHIATRIC: Normal mood and affect. Normal behavior. Cooperative No change in physical examination since last office encounter  CARDIAC DATABASE: EKG: 09/05/2020 1128: Normal sinus rhythm, 73 bpm, without underlying ischemia or injury pattern.  11/01/2020: NSR, 77bpm, no injury pattern, non-specific T wave abnormalities.   Echocardiogram: 09/06/2020:  1. Left ventricular ejection fraction, by estimation, is 60 to 65%. The left ventricle has normal function. The left ventricle has no regional wall motion abnormalities. There is mild left ventricular hypertrophy. Left ventricular diastolic parameters  are indeterminate. Elevated left atrial pressure.   2. Right ventricular systolic function is normal. The right ventricular size is normal.   3. The mitral valve is grossly normal. No evidence of mitral valve regurgitation. No evidence of mitral stenosis.   4. The aortic valve is tricuspid. Aortic valve regurgitation is not visualized. Mild aortic valve sclerosis is present, with no evidence of aortic valve stenosis.   5. The inferior vena cava is normal in size with greater than 50% respiratory variability, suggesting right atrial pressure of 3 mmHg.   Stress Testing: Lexiscan myoview stress test 08/21/2017:  1. Lexiscan stress test was performed. Exercise capacity was not assessed. Resting BP 148/90 mmHg, peak effect BP 168/90 mmHg. Stress symptoms included dizziness, nausea, headache, chest tightness.  2. The overall quality of the study is good. There is no evidence of abnormal lung activity. Stress and rest SPECT images demonstrate homogeneous tracer distribution throughout the myocardium. Gated SPECT imaging reveals normal myocardial thickening and wall motion. The left ventricular ejection fraction was normal  calculated as 45%, although visually appears normal.  3. Low risk study.  Heart Catheterization: Coronary intervention 09/05/2020: LM: Normal LAD: Distal apical 70% stenosis (non-culprit, very small caliber) Lcx: Mid Lcx 30% disease with just after a small OM1 branch (non-culprit)        Prox OM2 with 95% stenosis (culprit) RCA: Small caliber PDA with diffuse  60% disease LVEF 50-55% with apical inferolateral hypokinesis   Successful percutaneous coronary intervention OM2        PTCA and stent placement 2.25 X 12 mm Onyx drug-eluting stent        IVUS guided Post dilatation using 2.25 mm [place] at 22 atm and 2.5 mm [place] at 16 atm  ABI 08/21/2017: This exam reveals normal perfusion of both the  lower extremity (RABI 1.27 and LABI 1.20 with biphasic waveform).  ABI may be falsely elevated in patients with DM and medial calcinosis.  LABORATORY DATA: CBC Latest Ref Rng & Units 12/14/2020 09/11/2020 09/10/2020  WBC 4.0 - 10.5 K/uL 9.4 5.7 7.2  Hemoglobin 12.0 - 15.0 g/dL 15.1(H) 11.2(L) 12.6  Hematocrit 36.0 - 46.0 % 46.9(H) 34.3(L) 40.6  Platelets 150 - 400 K/uL 371 299 348    CMP Latest Ref Rng & Units 12/14/2020 11/19/2020 09/11/2020  Glucose 70 - 99 mg/dL 241(H) 200(H) 190(H)  BUN 6 - 20 mg/dL '19 19 16  ' Creatinine 0.44 - 1.00 mg/dL 1.39(H) 1.07(H) 1.26(H)  Sodium 135 - 145 mmol/L 142 140 136  Potassium 3.5 - 5.1 mmol/L 3.9 4.2 3.9  Chloride 98 - 111 mmol/L 108 102 101  CO2 22 - 32 mmol/L 18(L) 27 27  Calcium 8.9 - 10.3 mg/dL 9.5 9.0 8.5(L)  Total Protein 6.5 - 8.1 g/dL 8.3(H) - -  Total Bilirubin 0.3 - 1.2 mg/dL 1.4(H) - -  Alkaline Phos 38 - 126 U/L 60 - -  AST 15 - 41 U/L 19 - -  ALT 0 - 44 U/L 16 - -    Lipid Panel  Lab Results  Component Value Date   CHOL 175 09/06/2020   HDL 56 09/06/2020   LDLCALC 92 09/06/2020   TRIG 137 09/06/2020   CHOLHDL 3.1 09/06/2020     No components found for: NTPROBNP Recent Labs    11/19/20 1635  PROBNP 228   No results for input(s): TSH  in the last 8760 hours.  BMP Recent Labs    09/10/20 0238 09/11/20 0020 11/19/20 1635 12/14/20 1005  NA 139 136 140 142  K 3.4* 3.9 4.2 3.9  CL 103 101 102 108  CO2 '27 27 27 ' 18*  GLUCOSE 120* 190* 200* 241*  BUN '12 16 19 19  ' CREATININE 1.02* 1.26* 1.07* 1.39*  CALCIUM 8.3* 8.5* 9.0 9.5  GFRNONAA >60 52*  --  46*    HEMOGLOBIN A1C Lab Results  Component Value Date   HGBA1C 8.4 (H) 08/13/2020   MPG 194.38 08/13/2020    IMPRESSION:    ICD-10-CM   1. Atherosclerosis of native coronary artery of native heart without angina pectoris  I25.10     2. History of non-ST elevation myocardial infarction (NSTEMI)  I25.2     3. Hx of heart artery stent  Z95.5     4. Diastolic dysfunction without heart failure  I51.89     5. Essential hypertension  I10     6. History of CVA with residual deficit  I69.30     7. Type 2 diabetes mellitus with diabetic neuropathy, with long-term current use of insulin (HCC)  E11.40    Z79.4     8. Long-term insulin use (HCC)  Z79.4     9. Type 2 diabetes mellitus with other specified complication, with long-term current use of insulin (HCC)  E11.69    Z79.4     10. Class 2 severe obesity due to excess calories with serious comorbidity and body mass  index (BMI) of 39.0 to 39.9 in adult [facility])  E66.01    Z68.39         RECOMMENDATIONS: Tina [name] is a 51 y.o. African-American female whose past medical history and cardiac risk factors include: OSA on CPAP, hypertension, insulin-dependent diabetes mellitus, h/o stroke 2011, obesity, NSTEMI treated with OM2 PCI 09/05/2020, hyperlipidemia, obesity due to excess calories.  Atherosclerosis of native coronary artery of native heart without angina pectoris/history of non-STEMI/PTCA and PCI of OM 2: Denies angina pectoris. No use of sublingual nitroglycerin tablets. Compliant with dual antiplatelet therapy until 09/01/2021 and longer if able due to the size the stent (per interventional  cardiology) Continue with statin therapy. Patient has lost approximately 19 pounds since last office visit due to lifestyle changes-for which she is congratulated for. Blood pressure has also improved significantly compared to her prior visits. Patient states that her blood glucose levels have also improved.  Is not aware of the last hemoglobin A1c.  Diastolic dysfunction without heart failure No prior history of CHF. Patient is working on improving her modifiable cardiovascular risk factors. Pharmacological therapy has been challenging in the past due to multiple allergies/intolerances (valsartan: Swelling/angioedema?,  Spironolactone hyperkalemia). Has tolerated hydralazine well. Medications reconciled. Monitor for now  Essential hypertension Office blood pressures have improved significantly compared to prior office visits Medications reconciled. At times she is experiencing lightheaded and dizziness.  Based on her neurology is currently on clonidine 0.2 mg 2 tablets twice a day.  I have asked her to decrease clonidine to 0.2 mg p.o. twice daily until she follows up with her PCP in the coming weeks.  If possible would recommend weaning off of clonidine.  Will defer to primary team for now.  History of CVA with residual deficit Educated on importance of secondary prevention. Medications reconciled.  Type 2 diabetes mellitus with diabetic neuropathy, with long-term current use of insulin (HCC) Currently not on ACE inhibitor/ARB due to swelling?  Angioedema. Was on Farxiga-now listed as one of her allergies/intolerances. Continue statin therapy. Has completed cardiac rehab. Educated on importance of improving physical activity to goal of 30 minutes a day 5 days a week as tolerated.  Class 2 severe obesity due to excess calories with serious comorbidity and body mass index (BMI) of 39.0 to 39.9 in adult [facility]) Body mass index is 39.06 kg/m. I reviewed with the patient the importance of  diet, regular physical activity/exercise, weight loss.   Patient is educated on increasing physical activity gradually as tolerated.  With the goal of moderate intensity exercise for 30 minutes a day 5 days a week.  Patient states that she has an upcoming office visit with PCP next month.  I have encouraged her to forward a copy of her labs for reference.  Overall doing well from a cardiovascular standpoint.  No significant changes made to her medical management.  Would like to see her back in 6 months or sooner if change in clinical status.  Total time spent: 30 minutes.  FINAL MEDICATION LIST END OF ENCOUNTER: No orders of the defined types were placed in this encounter.   Medications Discontinued During This Encounter  Medication Reason   famotidine (PEPCID) 20 MG tablet    ondansetron (ZOFRAN) 4 MG tablet    promethazine (PHENERGAN) 25 MG suppository      Current Outpatient Medications:    albuterol (VENTOLIN HFA) 108 (90 Base) MCG/ACT inhaler, Inhale 2 puffs into the lungs every 4 (four) hours as needed for wheezing or shortness  of breath., Disp: 18 g, Rfl: 1   allopurinol (ZYLOPRIM) 100 MG tablet, Take 100 mg by mouth at bedtime., Disp: , Rfl:    amLODipine (NORVASC) 5 MG tablet, Take 1 tablet (5 mg total) by mouth daily., Disp: 90 tablet, Rfl: 0   aspirin EC 81 MG tablet, Take 81 mg by mouth at bedtime. Swallow whole., Disp: , Rfl:    atorvastatin (LIPITOR) 40 MG tablet, Take 1 tablet (40 mg total) by mouth at bedtime., Disp: 90 tablet, Rfl: 0   carvedilol (COREG) 25 MG tablet, Take 25 mg by mouth 2 (two) times daily., Disp: , Rfl:    cetirizine (ZYRTEC) 10 MG tablet, Take 10 mg by mouth daily as needed for allergies (itching)., Disp: , Rfl:    cloNIDine (CATAPRES) 0.2 MG tablet, Take 0.4 mg by mouth 2 (two) times daily., Disp: , Rfl:    clopidogrel (PLAVIX) 75 MG tablet, Take 1 tablet (75 mg total) by mouth daily., Disp: 90 tablet, Rfl: 3   Continuous Blood Gluc Sensor (FREESTYLE  LIBRE 2 SENSOR) MISC, Inject 1 Device into the skin every 14 (fourteen) days., Disp: , Rfl:    DULoxetine (CYMBALTA) 20 MG capsule, Take 20-40 mg by mouth See admin instructions. Take 20 mg by mouth in the morning and 40 mg at bedtime, Disp: , Rfl:    EPINEPHrine (AUVI-Q) 0.3 mg/0.3 mL IJ SOAJ injection, Inject 0.3 mg into the muscle as needed for anaphylaxis., Disp: 1 each, Rfl: 1   furosemide (LASIX) 40 MG tablet, Take 40 mg by mouth in the morning., Disp: , Rfl:    gabapentin (NEURONTIN) 300 MG capsule, Take 300 mg by mouth at bedtime., Disp: , Rfl:    hydrALAZINE (APRESOLINE) 10 MG tablet, Take 1 tablet (10 mg total) by mouth 3 (three) times daily., Disp: 180 tablet, Rfl: 3   Insulin Aspart FlexPen (NOVOLOG) 100 UNIT/ML, Inject 0-25 Units into the skin 3 (three) times daily before meals. Dose per sliding scale., Disp: , Rfl:    Insulin Glargine (BASAGLAR KWIKPEN) 100 UNIT/ML, Inject 22 Units into the skin at bedtime. (Patient taking differently: Inject 18 Units into the skin at bedtime.), Disp: , Rfl:    isosorbide mononitrate (IMDUR) 60 MG 24 hr tablet, Take 1 tablet (60 mg total) by mouth daily., Disp: 90 tablet, Rfl: 3   JARDIANCE 10 MG TABS tablet, Take 5-10 mg by mouth daily., Disp: , Rfl:    OZEMPIC, 2 MG/DOSE, 8 MG/3ML SOPN, Inject 2 mg into the skin once a week., Disp: , Rfl:    tiZANidine (ZANAFLEX) 2 MG tablet, Take 1 tablet (2 mg total) by mouth at bedtime., Disp: 90 tablet, Rfl: 1   traMADol (ULTRAM) 50 MG tablet, Take 1 tablet (50 mg total) by mouth 2 (two) times daily., Disp: 60 tablet, Rfl: 2  No orders of the defined types were placed in this encounter.   There are no Patient Instructions on file for this visit.   --Continue cardiac medications as reconciled in final medication list. --Return in about 6 months (around 08/18/2021) for Follow up, CAD. Or sooner if needed. --Continue follow-up with your primary care physician regarding the management of your other chronic comorbid  conditions.  Patient's questions and concerns were addressed to her satisfaction. She voices understanding of the instructions provided during this encounter.   This note was created using a voice recognition software as a result there may be grammatical errors inadvertently enclosed that do not reflect the nature of this encounter. Every  attempt is made to correct such errors.  Rex Kras, [place], [facility]  Pager: [phone] Office: [phone]

## 2021-02-19 ENCOUNTER — Telehealth: Payer: Self-pay | Admitting: *Deleted

## 2021-02-19 DIAGNOSIS — Z006 Encounter for examination for normal comparison and control in clinical research program: Secondary | ICD-10-CM

## 2021-02-19 NOTE — Telephone Encounter (Signed)
I called patient about the research study for [employer]). I was calling to explain the study and see if she had any interest in the study. I left message for patient to call me back.

## 2021-02-20 ENCOUNTER — Other Ambulatory Visit (HOSPITAL_BASED_OUTPATIENT_CLINIC_OR_DEPARTMENT_OTHER): Payer: Self-pay

## 2021-02-20 MED ORDER — OZEMPIC (2 MG/DOSE) 8 MG/3ML ~~LOC~~ SOPN
PEN_INJECTOR | SUBCUTANEOUS | 1 refills | Status: DC
  Filled 2021-02-20: qty 6, 56d supply, fill #0

## 2021-02-21 NOTE — Telephone Encounter (Signed)
I called patient about [employer])Study. I left message for patient to call me. I will send e-mail to patient and send consent for her to view.

## 2021-02-22 ENCOUNTER — Other Ambulatory Visit (HOSPITAL_COMMUNITY): Payer: Self-pay

## 2021-02-22 ENCOUNTER — Other Ambulatory Visit: Payer: Self-pay

## 2021-02-22 ENCOUNTER — Other Ambulatory Visit (HOSPITAL_BASED_OUTPATIENT_CLINIC_OR_DEPARTMENT_OTHER): Payer: Self-pay

## 2021-02-22 ENCOUNTER — Telehealth: Payer: Self-pay

## 2021-02-22 NOTE — Telephone Encounter (Signed)
Call to pt about next class at [facility] 03/26/21 615p-730p T/TH x 12 weeks Confirmed she is able to do.  Will call back to schedule intake

## 2021-03-04 DIAGNOSIS — Z7985 Long-term (current) use of injectable non-insulin antidiabetic drugs: Secondary | ICD-10-CM | POA: Diagnosis not present

## 2021-03-04 DIAGNOSIS — E1169 Type 2 diabetes mellitus with other specified complication: Secondary | ICD-10-CM | POA: Diagnosis not present

## 2021-03-04 DIAGNOSIS — Z794 Long term (current) use of insulin: Secondary | ICD-10-CM | POA: Diagnosis not present

## 2021-03-04 DIAGNOSIS — G4733 Obstructive sleep apnea (adult) (pediatric): Secondary | ICD-10-CM | POA: Diagnosis not present

## 2021-03-04 DIAGNOSIS — E113591 Type 2 diabetes mellitus with proliferative diabetic retinopathy without macular edema, right eye: Secondary | ICD-10-CM | POA: Diagnosis not present

## 2021-03-04 DIAGNOSIS — Z6837 Body mass index (BMI) 37.0-37.9, adult: Secondary | ICD-10-CM | POA: Diagnosis not present

## 2021-03-04 DIAGNOSIS — I509 Heart failure, unspecified: Secondary | ICD-10-CM | POA: Diagnosis not present

## 2021-03-04 DIAGNOSIS — I69351 Hemiplegia and hemiparesis following cerebral infarction affecting right dominant side: Secondary | ICD-10-CM | POA: Diagnosis not present

## 2021-03-04 DIAGNOSIS — I110 Hypertensive heart disease with heart failure: Secondary | ICD-10-CM | POA: Diagnosis not present

## 2021-03-05 DIAGNOSIS — E1122 Type 2 diabetes mellitus with diabetic chronic kidney disease: Secondary | ICD-10-CM | POA: Diagnosis not present

## 2021-03-05 DIAGNOSIS — E1169 Type 2 diabetes mellitus with other specified complication: Secondary | ICD-10-CM | POA: Diagnosis not present

## 2021-03-05 DIAGNOSIS — E1165 Type 2 diabetes mellitus with hyperglycemia: Secondary | ICD-10-CM | POA: Diagnosis not present

## 2021-03-07 ENCOUNTER — Telehealth: Payer: Self-pay | Admitting: *Deleted

## 2021-03-07 DIAGNOSIS — Z006 Encounter for examination for normal comparison and control in clinical research program: Secondary | ICD-10-CM

## 2021-03-07 NOTE — Telephone Encounter (Signed)
I called patient to inform patient of new study Ocean (a). I talked to patient and set-up appointment on Feb. 28 at 10:30am. I e-mailed patient directions to our office and copy of consent.

## 2021-03-11 ENCOUNTER — Other Ambulatory Visit: Payer: Self-pay

## 2021-03-11 ENCOUNTER — Ambulatory Visit (INDEPENDENT_AMBULATORY_CARE_PROVIDER_SITE_OTHER): Payer: Federal, State, Local not specified - PPO | Admitting: Ophthalmology

## 2021-03-11 ENCOUNTER — Encounter (INDEPENDENT_AMBULATORY_CARE_PROVIDER_SITE_OTHER): Payer: Self-pay | Admitting: Ophthalmology

## 2021-03-11 DIAGNOSIS — I252 Old myocardial infarction: Secondary | ICD-10-CM | POA: Diagnosis not present

## 2021-03-11 DIAGNOSIS — E1122 Type 2 diabetes mellitus with diabetic chronic kidney disease: Secondary | ICD-10-CM | POA: Diagnosis not present

## 2021-03-11 DIAGNOSIS — E113552 Type 2 diabetes mellitus with stable proliferative diabetic retinopathy, left eye: Secondary | ICD-10-CM | POA: Diagnosis not present

## 2021-03-11 DIAGNOSIS — E113511 Type 2 diabetes mellitus with proliferative diabetic retinopathy with macular edema, right eye: Secondary | ICD-10-CM | POA: Diagnosis not present

## 2021-03-11 DIAGNOSIS — Z794 Long term (current) use of insulin: Secondary | ICD-10-CM | POA: Diagnosis not present

## 2021-03-11 DIAGNOSIS — E118 Type 2 diabetes mellitus with unspecified complications: Secondary | ICD-10-CM | POA: Diagnosis not present

## 2021-03-11 DIAGNOSIS — E785 Hyperlipidemia, unspecified: Secondary | ICD-10-CM | POA: Diagnosis not present

## 2021-03-11 DIAGNOSIS — I10 Essential (primary) hypertension: Secondary | ICD-10-CM | POA: Diagnosis not present

## 2021-03-11 DIAGNOSIS — K219 Gastro-esophageal reflux disease without esophagitis: Secondary | ICD-10-CM | POA: Diagnosis not present

## 2021-03-11 NOTE — Assessment & Plan Note (Signed)
OD, vastly improved overall with quiet PDR yet some recurrence of center involved CSME will need to resume intravitreal antivegF in the near future.

## 2021-03-11 NOTE — Progress Notes (Signed)
03/11/2021     CHIEF COMPLAINT Patient presents for  Chief Complaint  Patient presents with   Diabetic Retinopathy without Macular Edema      HISTORY OF PRESENT ILLNESS: Tina [name] is a 51 y.o. female who presents to the clinic today for:   HPI   No interval change in vision per patient, may be some fuzziness this morning however Last edited by Hurman Horn, MD on 03/11/2021 11:23 AM.      Referring physician: [name], DO [address] [place],  [place] [address]  HISTORICAL INFORMATION:   Selected notes from the MEDICAL RECORD NUMBER    Lab Results  Component Value Date   HGBA1C 8.4 (H) 08/13/2020     CURRENT MEDICATIONS: No current outpatient medications on file. (Ophthalmic Drugs)   No current facility-administered medications for this visit. (Ophthalmic Drugs)   Current Outpatient Medications (Other)  Medication Sig   albuterol (VENTOLIN HFA) 108 (90 Base) MCG/ACT inhaler Inhale 2 puffs into the lungs every 4 (four) hours as needed for wheezing or shortness of breath.   allopurinol (ZYLOPRIM) 100 MG tablet Take 100 mg by mouth at bedtime.   amLODipine (NORVASC) 5 MG tablet Take 1 tablet (5 mg total) by mouth daily.   aspirin EC 81 MG tablet Take 81 mg by mouth at bedtime. Swallow whole.   atorvastatin (LIPITOR) 40 MG tablet Take 1 tablet (40 mg total) by mouth at bedtime.   carvedilol (COREG) 25 MG tablet Take 25 mg by mouth 2 (two) times daily.   cetirizine (ZYRTEC) 10 MG tablet Take 10 mg by mouth daily as needed for allergies (itching).   cloNIDine (CATAPRES) 0.2 MG tablet Take 0.4 mg by mouth 2 (two) times daily.   clopidogrel (PLAVIX) 75 MG tablet Take 1 tablet (75 mg total) by mouth daily.   Continuous Blood Gluc Sensor (FREESTYLE LIBRE 2 SENSOR) MISC Inject 1 Device into the skin every 14 (fourteen) days.   DULoxetine (CYMBALTA) 20 MG capsule Take 20-40 mg by mouth See admin instructions. Take 20 mg by mouth in the morning and  40 mg at bedtime   EPINEPHrine (AUVI-Q) 0.3 mg/0.3 mL IJ SOAJ injection Inject 0.3 mg into the muscle as needed for anaphylaxis.   furosemide (LASIX) 40 MG tablet Take 40 mg by mouth in the morning.   gabapentin (NEURONTIN) 300 MG capsule Take 300 mg by mouth at bedtime.   hydrALAZINE (APRESOLINE) 10 MG tablet Take 1 tablet (10 mg total) by mouth 3 (three) times daily.   Insulin Aspart FlexPen (NOVOLOG) 100 UNIT/ML Inject 0-25 Units into the skin 3 (three) times daily before meals. Dose per sliding scale.   Insulin Glargine (BASAGLAR KWIKPEN) 100 UNIT/ML Inject 22 Units into the skin at bedtime. (Patient taking differently: Inject 18 Units into the skin at bedtime.)   isosorbide mononitrate (IMDUR) 60 MG 24 hr tablet Take 1 tablet (60 mg total) by mouth daily.   JARDIANCE 10 MG TABS tablet Take 5-10 mg by mouth daily.   OZEMPIC, 2 MG/DOSE, 8 MG/3ML SOPN Inject 2 mg into the skin once a week.   Semaglutide, 2 MG/DOSE, (OZEMPIC, 2 MG/DOSE,) 8 MG/3ML SOPN Inject 2 mg subcutaneous once weekly for 28 days   tiZANidine (ZANAFLEX) 2 MG tablet Take 1 tablet (2 mg total) by mouth at bedtime.   traMADol (ULTRAM) 50 MG tablet Take 1 tablet (50 mg total) by mouth 2 (two) times daily.   No current facility-administered medications for this visit. (  Other)      REVIEW OF SYSTEMS: ROS   Negative for: Constitutional, Gastrointestinal, Neurological, Skin, Genitourinary, Musculoskeletal, HENT, Endocrine, Cardiovascular, Eyes, Respiratory, Psychiatric, Allergic/Imm, Heme/Lymph Last edited by Hurman Horn, MD on 03/11/2021 10:52 AM.       ALLERGIES Allergies  Allergen Reactions   Amlodipine Benzoate Swelling and Other (See Comments)    Leg swelling Other reaction(s): leg swelling   Contrast Media [Iodinated Contrast Media] Shortness Of Breath and Other (See Comments)    Difficulty breathing   Iodine Anaphylaxis    Other reaction(s): Unknown   Iohexol Hives, Nausea And Vomiting, Swelling and Other  (See Comments)     Desc: Magnevist-gadolinium-difficulty breathing, throat swelling    Midazolam Hcl Anaphylaxis    Difficulty breathing   Other Shortness Of Breath, Itching and Other (See Comments)    Patient is allergic to all nuts except peanuts, which are NOT "nuts"- they are legumes   Shellfish Allergy Anaphylaxis   Valsartan Swelling    Other reaction(s): Unknown   Metformin And Related Diarrhea, Nausea And Vomiting and Other (See Comments)    Dehydration, also   [place] [Dapagliflozin] Nausea Only and Other (See Comments)    UTI, headaches and muscle aches, also   Hydralazine Other (See Comments)    Reaction not recalled Other reaction(s): Unknown   Saxagliptin Hives, Nausea And Vomiting and Other (See Comments)    ONGLYZA- dehydration, also Other reaction(s): Unknown   Spironolactone Other (See Comments)    Elevated potassium- Severe hyperkalemia Other reaction(s): severe hyperkalemia   Avandia [Rosiglitazone Maleate] Hives and Other (See Comments)   Geodon [Ziprasidone] Other (See Comments)    Reaction not recalled   Kiwi Extract Itching and Swelling   Latex Itching    PAST MEDICAL HISTORY Past Medical History:  Diagnosis Date   Allergy    Anemia    DURING MENSES--HAS HEAVY BLEEDING WITH PERODS   Angioedema 08/13/2020   Anxiety    Back pain, chronic    "ongoing"   Blood transfusion    IN 2012  AFTER C -SECTION   Cerebral thrombosis with cerebral infarction ([place]) 06/2009   RIGHT SIDED WEAKNESS ( ARM AND LEG ) AND SPASMS-remains with slight weakness and vertigo.   Constipation    Depression    Diabetes mellitus    Diabetic neuropathy (HCC)    BOTH FEET --COMES AND GOES   Edema, lower extremity    Endometriosis    Fatty liver    GERD (gastroesophageal reflux disease)    with pregnancy   H/O eye surgery    Headache(784.0)    MIGRAINES--NOT REALLY HEADACHE-MORE LIKE PRESSURE SENSATION IN HEAD-FEELS DIZZIY AND  FAINT AS THE PRESSURE RESOLVES   Hernia,  incisional, RLQ, s/p lap repair Sep 2013 09/02/2011   History of vertigo 03/22/2018   Hx of migraines 10/19/2011   Hyperlipidemia    Hypertension    Leg pain, right    "like bad Charley horse"   Multiple food allergies    Panic disorder without agoraphobia    Rash    HANDS, ARMS --STATES HX OF RASH [place].  STATES THE RASH OFTEN OCCURS WHEN SHE IS REALLY STRESSED."goes and comes-presently left ring finger"   Restless leg syndrome    DIAGNOSED BY SLEEP STUDY - PT TOLD SHE DID NOT HAVE SLEEP APNEA   Right rotator cuff tear    PAIN IN RIGHT SHOULDER   SBO (small bowel obstruction) (HCC) 06/03/2012   Shortness of breath    Spastic  hemiplegia affecting dominant side (HCC)    Stomach problems    Stroke (HCC)    Ventral hernia    RIGHT LOWER QUADRANT-CAUSING SOME PAIN   Weakness of right side of body    Past Surgical History:  Procedure Laterality Date   APPLICATION OF WOUND VAC N/A 05/25/2015   Procedure: APPLICATION OF WOUND VAC;  Surgeon: Michael Boston, MD;  Location: [facility];  Service: General;  Laterality: N/A;   CARDIAC CATHETERIZATION     CESAREAN SECTION  2012   COLONOSCOPY     CORONARY STENT INTERVENTION N/A 09/05/2020   Procedure: CORONARY STENT INTERVENTION;  Surgeon: Nigel Mormon, MD;  Location: [place] [facility];  Service: Cardiovascular;  Laterality: N/A;   DIAGNOSTIC LAPAROSCOPY     ESOPHAGOGASTRODUODENOSCOPY N/A 06/03/2012   Procedure: ESOPHAGOGASTRODUODENOSCOPY (EGD);  Surgeon: [name] Craver, MD;  Location: [facility];  Service: Endoscopy;  Laterality: N/A;   EXCISION MASS ABDOMINAL N/A 05/25/2015   Procedure: ABDOMINAL WALL EXPLORATION EXCISION OF SEROMA REMOVAL OF REDUNDANT SKIN ;  Surgeon: Michael Boston, MD;  Location: [facility];  Service: General;  Laterality: N/A;   EYE SURGERY     Eye laser for vessel hemorrhaging   fybroid removal     HERNIA REPAIR  10/03/2011   ventral hernia repair   INSERTION OF MESH N/A 02/04/2013    Procedure: INSERTION OF MESH;  Surgeon: Adin Hector, MD;  Location: [facility];  Service: General;  Laterality: N/A;   INTRAVASCULAR ULTRASOUND/IVUS N/A 09/05/2020   Procedure: Intravascular Ultrasound/IVUS;  Surgeon: Nigel Mormon, MD;  Location: [place] [facility];  Service: Cardiovascular;  Laterality: N/A;   LEFT HEART CATH AND CORONARY ANGIOGRAPHY N/A 09/05/2020   Procedure: LEFT HEART CATH AND CORONARY ANGIOGRAPHY;  Surgeon: Nigel Mormon, MD;  Location: [place] [facility];  Service: Cardiovascular;  Laterality: N/A;   UMBILICAL HERNIA REPAIR N/A 02/04/2013   Procedure: LAPAROSCOPIC ventral wall hernia repair LAPAROSCOPIC LYSIS OF ADHESIONS laparoscopic exploration of abdomen ;  Surgeon: Adin Hector, MD;  Location: [facility];  Service: General;  Laterality: N/A;   UPPER GASTROINTESTINAL ENDOSCOPY     URETER REVISION     Bilateral "twisted"   UTERINE FIBROID SURGERY     2 SURGERIES FOR FIBROIDS   VENTRAL HERNIA REPAIR  10/03/2011   Procedure: LAPAROSCOPIC VENTRAL HERNIA;  Surgeon: Adin Hector, MD;  Location: [facility];  Service: General;  Laterality: N/A;    FAMILY HISTORY Family History  Problem Relation Age of Onset   Diabetes Father    Kidney disease Father    Depression Father    Drug abuse Father    Allergic rhinitis Mother    Eczema Mother    Urticaria Mother    Depression Mother    Anxiety disorder Mother    Bipolar disorder Mother    Alcoholism Mother    Drug abuse Mother    Eating disorder Mother    Diabetes Maternal Grandmother    Hyperlipidemia Paternal Grandmother    Stroke Paternal Grandmother    Eczema Sister    Urticaria Sister    Colon cancer Paternal Uncle    Other Neg Hx    Angioedema Neg Hx    Asthma Neg Hx    Colon polyps Neg Hx    Esophageal cancer Neg Hx    Rectal cancer Neg Hx    Stomach cancer Neg Hx     SOCIAL HISTORY Social History   Tobacco Use   Smoking status: Never  Smokeless tobacco: Never  Vaping Use   Vaping  Use: Never used  Substance Use Topics   Alcohol use: No    Alcohol/week: 0.0 standard drinks   Drug use: No         OPHTHALMIC EXAM:  Base Eye Exam     Visual Acuity (ETDRS)       Right Left   Dist [place] 20/30 20/25 -2   Dist ph [place] NI          Tonometry (Tonopen, 10:54 AM)       Right Left   Pressure 11 16         Pupils       Pupils APD   Right PERRL None   Left PERRL None         Visual Fields       Left Right    Full Full         Neuro/Psych     Oriented x3: Yes   Mood/Affect: Normal         Dilation     Both eyes: 1.0% Mydriacyl, 2.5% Phenylephrine @ 10:54 AM           Slit Lamp and Fundus Exam     External Exam       Right Left   External Normal Normal         Slit Lamp Exam       Right Left   Lids/Lashes Normal Normal   Conjunctiva/Sclera White and quiet White and quiet   Cornea Clear Clear   Anterior Chamber Deep and quiet Deep and quiet   Iris Round and reactive Round and reactive   Lens Centered Posterior chamber intraocular lens, 2+ Posterior capsular opacification Posterior chamber intraocular lens   Anterior Vitreous Normal Normal         Fundus Exam       Right Left   Posterior Vitreous Vitrectomized Vitrectomized   Disc Normal Normal   C/D Ratio 0.2 0.1   Macula Microaneurysms, no exudates, no macular thickening clinical, Moderate clinically significant macular edema no exudates, no macular thickening, no clinically significant macular edema, Microaneurysms   Vessels PDR-quiet PDR-quiet   Periphery Good PRP, a attached Good PRP, a attached            IMAGING AND PROCEDURES  Imaging and Procedures for 03/11/21  OCT, Retina - OU - Both Eyes       Right Eye Quality was good. Scan locations included subfoveal. Central Foveal Thickness: 389. Progression has been stable. Findings include cystoid macular edema, abnormal foveal contour.   Left Eye Quality was good. Scan locations included subfoveal.  Central Foveal Thickness: 361. Progression has been stable. Findings include normal foveal contour.   Notes Temporal thickening, now with center involved CSME OD, Will likely need intravitreal Avastin soon                  ASSESSMENT/PLAN:  Diabetic macular edema of right eye with proliferative retinopathy associated with type 2 diabetes mellitus ([place]) OD, vastly improved overall with quiet PDR yet some recurrence of center involved CSME will need to resume intravitreal antivegF in the near future.  Stable treated proliferative diabetic retinopathy of left eye determined by examination associated with type 2 diabetes mellitus ([place]) OS remained stable no recurrence of CSME nor PDR     ICD-10-CM   1. Diabetic macular edema of right eye with proliferative retinopathy associated with type 2 diabetes mellitus ([place])  E11.3511 OCT, Retina -  OU - Both Eyes    2. Stable treated proliferative diabetic retinopathy of left eye determined by examination associated with type 2 diabetes mellitus ([place])  K48.1856 OCT, Retina - OU - Both Eyes      1.  OD with recurrence of CSME will need antivegF, will schedule within 1 week OD  2.  3.  Ophthalmic Meds Ordered this visit:  No orders of the defined types were placed in this encounter.      Return in about 1 week (around 03/18/2021) for NO DILATE, AVASTIN OCT, OD.  There are no Patient Instructions on file for this visit.   Explained the diagnoses, plan, and follow up with the patient and they expressed understanding.  Patient expressed understanding of the importance of proper follow up care.   Clent Demark Graviel Payeur M.D. Diseases & Surgery of the Retina and Vitreous Retina & Diabetic [place] 03/11/21     Abbreviations: M myopia (nearsighted); A astigmatism; H hyperopia (farsighted); P presbyopia; Mrx spectacle prescription;  CTL contact lenses; OD right eye; OS left eye; OU both eyes  XT exotropia; ET esotropia; PEK punctate  epithelial keratitis; PEE punctate epithelial erosions; DES dry eye syndrome; MGD meibomian gland dysfunction; ATs artificial tears; PFAT's preservative free artificial tears; [place] nuclear sclerotic cataract; PSC posterior subcapsular cataract; ERM epi-retinal membrane; PVD posterior vitreous detachment; RD retinal detachment; DM diabetes mellitus; DR diabetic retinopathy; NPDR non-proliferative diabetic retinopathy; PDR proliferative diabetic retinopathy; CSME clinically significant macular edema; DME diabetic macular edema; dbh dot blot hemorrhages; CWS cotton wool spot; POAG primary open angle glaucoma; C/D cup-to-disc ratio; HVF humphrey visual field; GVF goldmann visual field; OCT optical coherence tomography; IOP intraocular pressure; BRVO Branch retinal vein occlusion; CRVO central retinal vein occlusion; CRAO central retinal artery occlusion; BRAO branch retinal artery occlusion; RT retinal tear; SB scleral buckle; PPV pars plana vitrectomy; VH Vitreous hemorrhage; PRP panretinal laser photocoagulation; IVK intravitreal kenalog; VMT vitreomacular traction; MH Macular hole;  NVD neovascularization of the disc; NVE neovascularization elsewhere; AREDS age related eye disease study; ARMD age related macular degeneration; POAG primary open angle glaucoma; EBMD epithelial/anterior basement membrane dystrophy; ACIOL anterior chamber intraocular lens; IOL intraocular lens; PCIOL posterior chamber intraocular lens; Phaco/IOL phacoemulsification with intraocular lens placement; [place] photorefractive keratectomy; LASIK laser assisted in situ keratomileusis; HTN hypertension; DM diabetes mellitus; COPD chronic obstructive pulmonary disease

## 2021-03-11 NOTE — Assessment & Plan Note (Signed)
OS remained stable no recurrence of CSME nor PDR

## 2021-03-12 DIAGNOSIS — E1122 Type 2 diabetes mellitus with diabetic chronic kidney disease: Secondary | ICD-10-CM | POA: Diagnosis not present

## 2021-03-12 DIAGNOSIS — E1142 Type 2 diabetes mellitus with diabetic polyneuropathy: Secondary | ICD-10-CM | POA: Diagnosis not present

## 2021-03-12 DIAGNOSIS — E785 Hyperlipidemia, unspecified: Secondary | ICD-10-CM | POA: Diagnosis not present

## 2021-03-12 DIAGNOSIS — I10 Essential (primary) hypertension: Secondary | ICD-10-CM | POA: Diagnosis not present

## 2021-03-13 DIAGNOSIS — E1165 Type 2 diabetes mellitus with hyperglycemia: Secondary | ICD-10-CM | POA: Diagnosis not present

## 2021-03-13 DIAGNOSIS — E1169 Type 2 diabetes mellitus with other specified complication: Secondary | ICD-10-CM | POA: Diagnosis not present

## 2021-03-13 DIAGNOSIS — E1122 Type 2 diabetes mellitus with diabetic chronic kidney disease: Secondary | ICD-10-CM | POA: Diagnosis not present

## 2021-03-18 ENCOUNTER — Telehealth: Payer: Self-pay

## 2021-03-18 NOTE — Telephone Encounter (Signed)
VMT pt req call back to discuss next evening PREP class starting on 04/02/21 ? ?

## 2021-03-25 ENCOUNTER — Other Ambulatory Visit: Payer: Self-pay

## 2021-03-25 ENCOUNTER — Ambulatory Visit (INDEPENDENT_AMBULATORY_CARE_PROVIDER_SITE_OTHER): Payer: Federal, State, Local not specified - PPO | Admitting: Ophthalmology

## 2021-03-25 ENCOUNTER — Encounter (INDEPENDENT_AMBULATORY_CARE_PROVIDER_SITE_OTHER): Payer: Self-pay | Admitting: Ophthalmology

## 2021-03-25 DIAGNOSIS — E113511 Type 2 diabetes mellitus with proliferative diabetic retinopathy with macular edema, right eye: Secondary | ICD-10-CM | POA: Diagnosis not present

## 2021-03-25 MED ORDER — BEVACIZUMAB 2.5 MG/0.1ML IZ SOSY
2.5000 mg | PREFILLED_SYRINGE | INTRAVITREAL | Status: AC | PRN
  Administered 2021-03-25: 2.5 mg via INTRAVITREAL

## 2021-03-25 NOTE — Assessment & Plan Note (Signed)
OD center involved CSME, from DM.  We will mess with therapy today intravitreal Avastin to resolve CSME ?

## 2021-03-25 NOTE — Progress Notes (Signed)
03/25/2021     CHIEF COMPLAINT Patient presents for  Chief Complaint  Patient presents with   Diabetic Retinopathy with Macular Edema      HISTORY OF PRESENT ILLNESS: Tina [name] is a 51 y.o. female who presents to the clinic today for:   HPI   2 weeks, NO DILATION, Avastin OCT, OD. Patient states vision is stable and unchanged since last visit. Denies any new floaters or FOL.  Last edited by Laurin Coder on 03/25/2021 10:52 AM.      Referring physician: [name], DO [address] [place],  [place] [address]  HISTORICAL INFORMATION:   Selected notes from the MEDICAL RECORD NUMBER    Lab Results  Component Value Date   HGBA1C 8.4 (H) 08/13/2020     CURRENT MEDICATIONS: No current outpatient medications on file. (Ophthalmic Drugs)   No current facility-administered medications for this visit. (Ophthalmic Drugs)   Current Outpatient Medications (Other)  Medication Sig   albuterol (VENTOLIN HFA) 108 (90 Base) MCG/ACT inhaler Inhale 2 puffs into the lungs every 4 (four) hours as needed for wheezing or shortness of breath.   allopurinol (ZYLOPRIM) 100 MG tablet Take 100 mg by mouth at bedtime.   amLODipine (NORVASC) 5 MG tablet Take 1 tablet (5 mg total) by mouth daily.   aspirin EC 81 MG tablet Take 81 mg by mouth at bedtime. Swallow whole.   atorvastatin (LIPITOR) 40 MG tablet Take 1 tablet (40 mg total) by mouth at bedtime.   carvedilol (COREG) 25 MG tablet Take 25 mg by mouth 2 (two) times daily.   cetirizine (ZYRTEC) 10 MG tablet Take 10 mg by mouth daily as needed for allergies (itching).   cloNIDine (CATAPRES) 0.2 MG tablet Take 0.4 mg by mouth 2 (two) times daily.   clopidogrel (PLAVIX) 75 MG tablet Take 1 tablet (75 mg total) by mouth daily.   Continuous Blood Gluc Sensor (FREESTYLE LIBRE 2 SENSOR) MISC Inject 1 Device into the skin every 14 (fourteen) days.   DULoxetine (CYMBALTA) 20 MG capsule Take 20-40 mg by mouth See admin  instructions. Take 20 mg by mouth in the morning and 40 mg at bedtime   EPINEPHrine (AUVI-Q) 0.3 mg/0.3 mL IJ SOAJ injection Inject 0.3 mg into the muscle as needed for anaphylaxis.   furosemide (LASIX) 40 MG tablet Take 40 mg by mouth in the morning.   gabapentin (NEURONTIN) 300 MG capsule Take 300 mg by mouth at bedtime.   hydrALAZINE (APRESOLINE) 10 MG tablet Take 1 tablet (10 mg total) by mouth 3 (three) times daily.   Insulin Aspart FlexPen (NOVOLOG) 100 UNIT/ML Inject 0-25 Units into the skin 3 (three) times daily before meals. Dose per sliding scale.   Insulin Glargine (BASAGLAR KWIKPEN) 100 UNIT/ML Inject 22 Units into the skin at bedtime. (Patient taking differently: Inject 18 Units into the skin at bedtime.)   isosorbide mononitrate (IMDUR) 60 MG 24 hr tablet Take 1 tablet (60 mg total) by mouth daily.   JARDIANCE 10 MG TABS tablet Take 5-10 mg by mouth daily.   OZEMPIC, 2 MG/DOSE, 8 MG/3ML SOPN Inject 2 mg into the skin once a week.   Semaglutide, 2 MG/DOSE, (OZEMPIC, 2 MG/DOSE,) 8 MG/3ML SOPN Inject 2 mg subcutaneous once weekly for 28 days   tiZANidine (ZANAFLEX) 2 MG tablet Take 1 tablet (2 mg total) by mouth at bedtime.   traMADol (ULTRAM) 50 MG tablet Take 1 tablet (50 mg total) by mouth 2 (two) times daily.  No current facility-administered medications for this visit. (Other)      REVIEW OF SYSTEMS:    ALLERGIES Allergies  Allergen Reactions   Amlodipine Benzoate Swelling and Other (See Comments)    Leg swelling Other reaction(s): leg swelling   Contrast Media [Iodinated Contrast Media] Shortness Of Breath and Other (See Comments)    Difficulty breathing   Iodine Anaphylaxis    Other reaction(s): Unknown   Iohexol Hives, Nausea And Vomiting, Swelling and Other (See Comments)     Desc: Magnevist-gadolinium-difficulty breathing, throat swelling    Midazolam Hcl Anaphylaxis    Difficulty breathing   Other Shortness Of Breath, Itching and Other (See Comments)     Patient is allergic to all nuts except peanuts, which are NOT "nuts"- they are legumes   Shellfish Allergy Anaphylaxis   Valsartan Swelling    Other reaction(s): Unknown   Metformin And Related Diarrhea, Nausea And Vomiting and Other (See Comments)    Dehydration, also   [place] [Dapagliflozin] Nausea Only and Other (See Comments)    UTI, headaches and muscle aches, also   Hydralazine Other (See Comments)    Reaction not recalled Other reaction(s): Unknown   Saxagliptin Hives, Nausea And Vomiting and Other (See Comments)    ONGLYZA- dehydration, also Other reaction(s): Unknown   Spironolactone Other (See Comments)    Elevated potassium- Severe hyperkalemia Other reaction(s): severe hyperkalemia   Avandia [Rosiglitazone Maleate] Hives and Other (See Comments)   Geodon [Ziprasidone] Other (See Comments)    Reaction not recalled   Kiwi Extract Itching and Swelling   Latex Itching    PAST MEDICAL HISTORY Past Medical History:  Diagnosis Date   Allergy    Anemia    DURING MENSES--HAS HEAVY BLEEDING WITH PERODS   Angioedema 08/13/2020   Anxiety    Back pain, chronic    "ongoing"   Blood transfusion    IN 2012  AFTER C -SECTION   Cerebral thrombosis with cerebral infarction ([place]) 06/2009   RIGHT SIDED WEAKNESS ( ARM AND LEG ) AND SPASMS-remains with slight weakness and vertigo.   Constipation    Depression    Diabetes mellitus    Diabetic neuropathy (HCC)    BOTH FEET --COMES AND GOES   Edema, lower extremity    Endometriosis    Fatty liver    GERD (gastroesophageal reflux disease)    with pregnancy   H/O eye surgery    Headache(784.0)    MIGRAINES--NOT REALLY HEADACHE-MORE LIKE PRESSURE SENSATION IN HEAD-FEELS DIZZIY AND  FAINT AS THE PRESSURE RESOLVES   Hernia, incisional, RLQ, s/p lap repair Sep 2013 09/02/2011   History of vertigo 03/22/2018   Hx of migraines 10/19/2011   Hyperlipidemia    Hypertension    Leg pain, right    "like bad Charley horse"   Multiple  food allergies    Panic disorder without agoraphobia    Rash    HANDS, ARMS --STATES HX OF RASH [place].  STATES THE RASH OFTEN OCCURS WHEN SHE IS REALLY STRESSED."goes and comes-presently left ring finger"   Restless leg syndrome    DIAGNOSED BY SLEEP STUDY - PT TOLD SHE DID NOT HAVE SLEEP APNEA   Right rotator cuff tear    PAIN IN RIGHT SHOULDER   SBO (small bowel obstruction) ([place]) 06/03/2012   Shortness of breath    Spastic hemiplegia affecting dominant side (HCC)    Stomach problems    Stroke (HCC)    Ventral hernia    RIGHT LOWER QUADRANT-CAUSING  SOME PAIN   Weakness of right side of body    Past Surgical History:  Procedure Laterality Date   APPLICATION OF WOUND VAC N/A 05/25/2015   Procedure: APPLICATION OF WOUND VAC;  Surgeon: Michael Boston, MD;  [facility];  Service: General;  Laterality: N/A;   CARDIAC CATHETERIZATION     CESAREAN SECTION  2012   COLONOSCOPY     CORONARY STENT INTERVENTION N/A 09/05/2020   Procedure: CORONARY STENT INTERVENTION;  Surgeon: Nigel Mormon, MD;  Location: [facility];  Service: Cardiovascular;  Laterality: N/A;   DIAGNOSTIC LAPAROSCOPY     ESOPHAGOGASTRODUODENOSCOPY N/A 06/03/2012   Procedure: ESOPHAGOGASTRODUODENOSCOPY (EGD);  Surgeon: [name] Craver, MD;  Location: [facility] ENDOSCOPY;  Service: Endoscopy;  Laterality: N/A;   EXCISION MASS ABDOMINAL N/A 05/25/2015   Procedure: ABDOMINAL WALL EXPLORATION EXCISION OF SEROMA REMOVAL OF REDUNDANT SKIN ;  Surgeon: Michael Boston, MD;  [facility];  Service: General;  Laterality: N/A;   EYE SURGERY     Eye laser for vessel hemorrhaging   fybroid removal     HERNIA REPAIR  10/03/2011   ventral hernia repair   INSERTION OF MESH N/A 02/04/2013   Procedure: INSERTION OF MESH;  Surgeon: Adin Hector, MD;  [facility];  Service: General;  Laterality: N/A;   INTRAVASCULAR ULTRASOUND/IVUS N/A 09/05/2020   Procedure: Intravascular Ultrasound/IVUS;  Surgeon:  Nigel Mormon, MD;  Location: [place] [facility];  Service: Cardiovascular;  Laterality: N/A;   LEFT HEART CATH AND CORONARY ANGIOGRAPHY N/A 09/05/2020   Procedure: LEFT HEART CATH AND CORONARY ANGIOGRAPHY;  Surgeon: Nigel Mormon, MD;  Location: [facility];  Service: Cardiovascular;  Laterality: N/A;   UMBILICAL HERNIA REPAIR N/A 02/04/2013   Procedure: LAPAROSCOPIC ventral wall hernia repair LAPAROSCOPIC LYSIS OF ADHESIONS laparoscopic exploration of abdomen ;  Surgeon: Adin Hector, MD;  [facility];  Service: General;  Laterality: N/A;   UPPER GASTROINTESTINAL ENDOSCOPY     URETER REVISION     Bilateral "twisted"   UTERINE FIBROID SURGERY     2 SURGERIES FOR FIBROIDS   VENTRAL HERNIA REPAIR  10/03/2011   Procedure: LAPAROSCOPIC VENTRAL HERNIA;  Surgeon: Adin Hector, MD;  [facility];  Service: General;  Laterality: N/A;    FAMILY HISTORY Family History  Problem Relation Age of Onset   Diabetes Father    Kidney disease Father    Depression Father    Drug abuse Father    Allergic rhinitis Mother    Eczema Mother    Urticaria Mother    Depression Mother    Anxiety disorder Mother    Bipolar disorder Mother    Alcoholism Mother    Drug abuse Mother    Eating disorder Mother    Diabetes Maternal Grandmother    Hyperlipidemia Paternal Grandmother    Stroke Paternal Grandmother    Eczema Sister    Urticaria Sister    Colon cancer Paternal Uncle    Other Neg Hx    Angioedema Neg Hx    Asthma Neg Hx    Colon polyps Neg Hx    Esophageal cancer Neg Hx    Rectal cancer Neg Hx    Stomach cancer Neg Hx     SOCIAL HISTORY Social History   Tobacco Use   Smoking status: Never   Smokeless tobacco: Never  Vaping Use   Vaping Use: Never used  Substance Use Topics   Alcohol use: No    Alcohol/week: 0.0  standard drinks   Drug use: No         OPHTHALMIC EXAM:  Base Eye Exam     Visual Acuity (ETDRS)       Right Left   Dist [place]  20/30 -1 20/25 +2         Tonometry (Tonopen, 10:51 AM)       Right Left   Pressure 17 14         Pupils       Pupils Dark Light APD   Right PERRL 4 3 None   Left PERRL 4 3 None         Visual Fields (Counting fingers)       Left Right    Full Full         Extraocular Movement       Right Left    Full Full         Neuro/Psych     Oriented x3: Yes   Mood/Affect: Normal         Dilation     Both eyes: No dilation @ 10:51 AM           Slit Lamp and Fundus Exam     External Exam       Right Left   External Normal Normal         Slit Lamp Exam       Right Left   Lids/Lashes Normal Normal   Conjunctiva/Sclera White and quiet White and quiet   Cornea Clear Clear   Anterior Chamber Deep and quiet Deep and quiet   Iris Round and reactive Round and reactive   Lens Centered Posterior chamber intraocular lens, 2+ Posterior capsular opacification Posterior chamber intraocular lens   Anterior Vitreous Normal Normal         Fundus Exam       Right Left   Posterior Vitreous Vitrectomized Vitrectomized   Disc Normal Normal   C/D Ratio 0.2 0.1   Macula Microaneurysms, no exudates, no macular thickening clinical, Moderate clinically significant macular edema no exudates, no macular thickening, no clinically significant macular edema, Microaneurysms   Vessels PDR-quiet PDR-quiet   Periphery Good PRP, a attached Good PRP, a attached            IMAGING AND PROCEDURES  Imaging and Procedures for 03/25/21  Intravitreal Injection, Pharmacologic Agent - OD - Right Eye       Time Out 03/25/2021. 11:49 AM. Confirmed correct patient, procedure, site, and patient consented.   Anesthesia Topical anesthesia was used. Anesthetic medications included Lidocaine 4%.   Procedure Preparation included 5% betadine to ocular surface, 10% betadine to eyelids. A 30 gauge needle was used.   Injection: 2.5 mg bevacizumab 2.5 MG/0.1ML   Route:  Intravitreal, Site: Right Eye   NDC: [phone], Lot: [id number]   Post-op Post injection exam found visual acuity of at least counting fingers. The patient tolerated the procedure well. There were no complications. The patient received written and verbal post procedure care education. Post injection medications included ocuflox.              ASSESSMENT/PLAN:  Diabetic macular edema of right eye with proliferative retinopathy associated with type 2 diabetes mellitus ([place]) OD center involved CSME, from DM.  We will mess with therapy today intravitreal Avastin to resolve CSME      ICD-10-CM   1. Diabetic macular edema of right eye with proliferative retinopathy associated with type 2 diabetes mellitus (HCC)  W41.3244 Intravitreal  Injection, Pharmacologic Agent - OD - Right Eye    bevacizumab (AVASTIN) SOSY 2.5 mg    CANCELED: OCT, Retina - OU - Both Eyes      1.  OD very active center involved CSME and activity temporal to FAZ, will need Avastin today to control condition and follow-up again in 6 weeks 2.  3.  Ophthalmic Meds Ordered this visit:  Meds ordered this encounter  Medications   bevacizumab (AVASTIN) SOSY 2.5 mg       Return in about 6 weeks (around 05/06/2021) for dilate, OD, AVASTIN OCT.  There are no Patient Instructions on file for this visit.   Explained the diagnoses, plan, and follow up with the patient and they expressed understanding.  Patient expressed understanding of the importance of proper follow up care.   Clent Demark Manal Kreutzer M.D. Diseases & Surgery of the Retina and Vitreous Retina & Diabetic [place] 03/25/21     Abbreviations: M myopia (nearsighted); A astigmatism; H hyperopia (farsighted); P presbyopia; Mrx spectacle prescription;  CTL contact lenses; OD right eye; OS left eye; OU both eyes  XT exotropia; ET esotropia; PEK punctate epithelial keratitis; PEE punctate epithelial erosions; DES dry eye syndrome; MGD meibomian gland dysfunction;  ATs artificial tears; PFAT's preservative free artificial tears; [place] nuclear sclerotic cataract; PSC posterior subcapsular cataract; ERM epi-retinal membrane; PVD posterior vitreous detachment; RD retinal detachment; DM diabetes mellitus; DR diabetic retinopathy; NPDR non-proliferative diabetic retinopathy; PDR proliferative diabetic retinopathy; CSME clinically significant macular edema; DME diabetic macular edema; dbh dot blot hemorrhages; CWS cotton wool spot; POAG primary open angle glaucoma; C/D cup-to-disc ratio; HVF humphrey visual field; GVF goldmann visual field; OCT optical coherence tomography; IOP intraocular pressure; BRVO Branch retinal vein occlusion; CRVO central retinal vein occlusion; CRAO central retinal artery occlusion; BRAO branch retinal artery occlusion; RT retinal tear; SB scleral buckle; PPV pars plana vitrectomy; VH Vitreous hemorrhage; PRP panretinal laser photocoagulation; IVK intravitreal kenalog; VMT vitreomacular traction; MH Macular hole;  NVD neovascularization of the disc; NVE neovascularization elsewhere; AREDS age related eye disease study; ARMD age related macular degeneration; POAG primary open angle glaucoma; EBMD epithelial/anterior basement membrane dystrophy; ACIOL anterior chamber intraocular lens; IOL intraocular lens; PCIOL posterior chamber intraocular lens; Phaco/IOL phacoemulsification with intraocular lens placement; Berry photorefractive keratectomy; LASIK laser assisted in situ keratomileusis; HTN hypertension; DM diabetes mellitus; COPD chronic obstructive pulmonary disease

## 2021-03-26 ENCOUNTER — Encounter: Payer: Self-pay | Admitting: Physical Medicine & Rehabilitation

## 2021-03-26 ENCOUNTER — Encounter
Payer: Federal, State, Local not specified - PPO | Attending: Physical Medicine & Rehabilitation | Admitting: Physical Medicine & Rehabilitation

## 2021-03-26 VITALS — BP 159/106 | HR 89 | Temp 98.6°F | Ht 66.0 in | Wt 231.0 lb

## 2021-03-26 DIAGNOSIS — Z5181 Encounter for therapeutic drug level monitoring: Secondary | ICD-10-CM | POA: Diagnosis not present

## 2021-03-26 DIAGNOSIS — Z79891 Long term (current) use of opiate analgesic: Secondary | ICD-10-CM | POA: Insufficient documentation

## 2021-03-26 DIAGNOSIS — G894 Chronic pain syndrome: Secondary | ICD-10-CM | POA: Diagnosis not present

## 2021-03-26 MED ORDER — TRAMADOL HCL 50 MG PO TABS: 50.0000 mg | ORAL_TABLET | Freq: Every day | ORAL | 2 refills | Status: DC

## 2021-03-26 NOTE — Patient Instructions (Addendum)
Please obtain thigh hi support hose and wear when out of bed until blood pressure is regulated  ? ?Use Ice or heat for Right hip , cont exercises  ?

## 2021-03-26 NOTE — Progress Notes (Signed)
? ?Subjective:  ? ? Patient ID: Tina [name], female    DOB: [birth year], 51 y.o.   MRN: [id number] ? ?HPI ?Lost 10lb, had diarrhea due to "food poisoning " 2 mo ago but has not regained appetite ?Has had falls when getting out of bed , as well as going from lying to standing  ?Fell on lateral hip no bruising but sore .  Improving over the last 2 days ?Pain Inventory ?Average Pain 7 ?Pain Right Now 7 ?My pain is sharp, burning, and aching ? ?In the last 24 hours, has pain interfered with the following? ?General activity 8 ?Relation with others 8 ?Enjoyment of life 8 ?What TIME of day is your pain at its worst? morning , daytime, evening, and night ?Sleep (in general) Fair ? ?Pain is worse with: walking, sitting, inactivity, and standing ?Pain improves with: heat/ice and medication ?Relief from Meds: 7 ? ?Family History  ?Problem Relation Age of Onset  ? Diabetes Father   ? Kidney disease Father   ? Depression Father   ? Drug abuse Father   ? Allergic rhinitis Mother   ? Eczema Mother   ? Urticaria Mother   ? Depression Mother   ? Anxiety disorder Mother   ? Bipolar disorder Mother   ? Alcoholism Mother   ? Drug abuse Mother   ? Eating disorder Mother   ? Diabetes Maternal Grandmother   ? Hyperlipidemia Paternal Grandmother   ? Stroke Paternal Grandmother   ? Eczema Sister   ? Urticaria Sister   ? Colon cancer Paternal Uncle   ? Other Neg Hx   ? Angioedema Neg Hx   ? Asthma Neg Hx   ? Colon polyps Neg Hx   ? Esophageal cancer Neg Hx   ? Rectal cancer Neg Hx   ? Stomach cancer Neg Hx   ? ?Social History  ? ?Socioeconomic History  ? Marital status: Married  ?  Spouse name: Annie Main  ? Number of children: 3  ? Years of education: BA degree  ? Highest education level: Not on file  ?Occupational History  ? Occupation: stay at home mom  ?  Employer: UNEMPLOYED  ?Tobacco Use  ? Smoking status: Never  ? Smokeless tobacco: Never  ?Vaping Use  ? Vaping Use: Never used  ?Substance and Sexual Activity  ? Alcohol use: No  ?   Alcohol/week: 0.0 standard drinks  ? Drug use: No  ? Sexual activity: Yes  ?  Birth control/protection: None  ?Other Topics Concern  ? Not on file  ?Social History Narrative  ? Patient is married with 2 children.  ? Patient is right handed.  ? Patient has her  BA degree.  ? Patient drinks 1 cup daily.  ? ?Social Determinants of Health  ? ?Financial Resource Strain: Not on file  ?Food Insecurity: Not on file  ?Transportation Needs: Not on file  ?Physical Activity: Not on file  ?Stress: Not on file  ?Social Connections: Not on file  ? ?Past Surgical History:  ?Procedure Laterality Date  ? APPLICATION OF WOUND VAC N/A 05/25/2015  ? Procedure: APPLICATION OF WOUND VAC;  Surgeon: Michael Boston, MD;  [facility];  Service: General;  Laterality: N/A;  ? CARDIAC CATHETERIZATION    ? CESAREAN SECTION  2012  ? COLONOSCOPY    ? CORONARY STENT INTERVENTION N/A 09/05/2020  ? Procedure: CORONARY STENT INTERVENTION;  Surgeon: Nigel Mormon, MD;  Location: [place] [facility];  Service: Cardiovascular;  Laterality: N/A;  ?  DIAGNOSTIC LAPAROSCOPY    ? ESOPHAGOGASTRODUODENOSCOPY N/A 06/03/2012  ? Procedure: ESOPHAGOGASTRODUODENOSCOPY (EGD);  Surgeon: [name] Craver, MD;  Location: [facility];  Service: Endoscopy;  Laterality: N/A;  ? EXCISION MASS ABDOMINAL N/A 05/25/2015  ? Procedure: ABDOMINAL WALL EXPLORATION EXCISION OF SEROMA REMOVAL OF REDUNDANT SKIN ;  Surgeon: Michael Boston, MD;  [facility];  Service: General;  Laterality: N/A;  ? EYE SURGERY    ? Eye laser for vessel hemorrhaging  ? fybroid removal    ? HERNIA REPAIR  10/03/2011  ? ventral hernia repair  ? INSERTION OF MESH N/A 02/04/2013  ? Procedure: INSERTION OF MESH;  Surgeon: Adin Hector, MD;  [facility];  Service: General;  Laterality: N/A;  ? INTRAVASCULAR ULTRASOUND/IVUS N/A 09/05/2020  ? Procedure: Intravascular Ultrasound/IVUS;  Surgeon: Nigel Mormon, MD;  Location: [place] [facility];  Service: Cardiovascular;  Laterality: N/A;  ?  LEFT HEART CATH AND CORONARY ANGIOGRAPHY N/A 09/05/2020  ? Procedure: LEFT HEART CATH AND CORONARY ANGIOGRAPHY;  Surgeon: Nigel Mormon, MD;  Location: [place] [facility];  Service: Cardiovascular;  Laterality: N/A;  ? UMBILICAL HERNIA REPAIR N/A 02/04/2013  ? Procedure: LAPAROSCOPIC ventral wall hernia repair LAPAROSCOPIC LYSIS OF ADHESIONS laparoscopic exploration of abdomen ;  Surgeon: Adin Hector, MD;  [facility];  Service: General;  Laterality: N/A;  ? UPPER GASTROINTESTINAL ENDOSCOPY    ? URETER REVISION    ? Bilateral "twisted"  ? UTERINE FIBROID SURGERY    ? 2 SURGERIES FOR FIBROIDS  ? VENTRAL HERNIA REPAIR  10/03/2011  ? Procedure: LAPAROSCOPIC VENTRAL HERNIA;  Surgeon: Adin Hector, MD;  [facility];  Service: General;  Laterality: N/A;  ? ?Past Surgical History:  ?Procedure Laterality Date  ? APPLICATION OF WOUND VAC N/A 05/25/2015  ? Procedure: APPLICATION OF WOUND VAC;  Surgeon: Michael Boston, MD;  [facility];  Service: General;  Laterality: N/A;  ? CARDIAC CATHETERIZATION    ? CESAREAN SECTION  2012  ? COLONOSCOPY    ? CORONARY STENT INTERVENTION N/A 09/05/2020  ? Procedure: CORONARY STENT INTERVENTION;  Surgeon: Nigel Mormon, MD;  Location: [place] [facility];  Service: Cardiovascular;  Laterality: N/A;  ? DIAGNOSTIC LAPAROSCOPY    ? ESOPHAGOGASTRODUODENOSCOPY N/A 06/03/2012  ? Procedure: ESOPHAGOGASTRODUODENOSCOPY (EGD);  Surgeon: [name] Craver, MD;  Location: [facility] ENDOSCOPY;  Service: Endoscopy;  Laterality: N/A;  ? EXCISION MASS ABDOMINAL N/A 05/25/2015  ? Procedure: ABDOMINAL WALL EXPLORATION EXCISION OF SEROMA REMOVAL OF REDUNDANT SKIN ;  Surgeon: Michael Boston, MD;  [facility];  Service: General;  Laterality: N/A;  ? EYE SURGERY    ? Eye laser for vessel hemorrhaging  ? fybroid removal    ? HERNIA REPAIR  10/03/2011  ? ventral hernia repair  ? INSERTION OF MESH N/A 02/04/2013  ? Procedure: INSERTION OF MESH;  Surgeon: Adin Hector, MD;  [facility];   Service: General;  Laterality: N/A;  ? INTRAVASCULAR ULTRASOUND/IVUS N/A 09/05/2020  ? Procedure: Intravascular Ultrasound/IVUS;  Surgeon: Nigel Mormon, MD;  Location: [place] [facility];  Service: Cardiovascular;  Laterality: N/A;  ? LEFT HEART CATH AND CORONARY ANGIOGRAPHY N/A 09/05/2020  ? Procedure: LEFT HEART CATH AND CORONARY ANGIOGRAPHY;  Surgeon: Nigel Mormon, MD;  Location: [place] [facility];  Service: Cardiovascular;  Laterality: N/A;  ? UMBILICAL HERNIA REPAIR N/A 02/04/2013  ? Procedure: LAPAROSCOPIC ventral wall hernia repair LAPAROSCOPIC LYSIS OF ADHESIONS laparoscopic exploration of abdomen ;  Surgeon: Adin Hector, MD;  [facility];  Service: General;  Laterality: N/A;  ? UPPER GASTROINTESTINAL ENDOSCOPY    ? URETER REVISION    ? Bilateral "twisted"  ? UTERINE FIBROID SURGERY    ? 2 SURGERIES FOR FIBROIDS  ? VENTRAL HERNIA REPAIR  10/03/2011  ? Procedure: LAPAROSCOPIC VENTRAL HERNIA;  Surgeon: Adin Hector, MD;  [facility];  Service: General;  Laterality: N/A;  ? ?Past Medical History:  ?Diagnosis Date  ? Allergy   ? Anemia   ? DURING MENSES--HAS HEAVY BLEEDING WITH PERODS  ? Angioedema 08/13/2020  ? Anxiety   ? Back pain, chronic   ? "ongoing"  ? Blood transfusion   ? IN 2012  AFTER C -SECTION  ? Cerebral thrombosis with cerebral infarction [facility]) 06/2009  ? RIGHT SIDED WEAKNESS ( ARM AND LEG ) AND SPASMS-remains with slight weakness and vertigo.  ? Constipation   ? Depression   ? Diabetes mellitus   ? Diabetic neuropathy ([place])   ? BOTH FEET --COMES AND GOES  ? Edema, lower extremity   ? Endometriosis   ? Fatty liver   ? GERD (gastroesophageal reflux disease)   ? with pregnancy  ? H/O eye surgery   ? Headache(784.0)   ? MIGRAINES--NOT REALLY HEADACHE-MORE LIKE PRESSURE SENSATION IN HEAD-FEELS DIZZIY AND  FAINT AS THE PRESSURE RESOLVES  ? Hernia, incisional, RLQ, s/p lap repair Sep 2013 09/02/2011  ? History of vertigo 03/22/2018  ? Hx of migraines 10/19/2011  ?  Hyperlipidemia   ? Hypertension   ? Leg pain, right   ? "like bad Charley horse"  ? Multiple food allergies   ? Panic disorder without agoraphobia   ? Rash   ? HANDS, ARMS --STATES HX OF RASH EVER SINCE [facility]

## 2021-03-27 DIAGNOSIS — E1122 Type 2 diabetes mellitus with diabetic chronic kidney disease: Secondary | ICD-10-CM | POA: Diagnosis not present

## 2021-03-27 DIAGNOSIS — K219 Gastro-esophageal reflux disease without esophagitis: Secondary | ICD-10-CM | POA: Diagnosis not present

## 2021-03-27 DIAGNOSIS — E785 Hyperlipidemia, unspecified: Secondary | ICD-10-CM | POA: Diagnosis not present

## 2021-03-27 DIAGNOSIS — M109 Gout, unspecified: Secondary | ICD-10-CM | POA: Diagnosis not present

## 2021-03-27 DIAGNOSIS — I10 Essential (primary) hypertension: Secondary | ICD-10-CM | POA: Diagnosis not present

## 2021-03-28 ENCOUNTER — Other Ambulatory Visit (HOSPITAL_BASED_OUTPATIENT_CLINIC_OR_DEPARTMENT_OTHER): Payer: Self-pay

## 2021-03-28 MED ORDER — OZEMPIC (2 MG/DOSE) 8 MG/3ML ~~LOC~~ SOPN
PEN_INJECTOR | SUBCUTANEOUS | 3 refills | Status: DC
  Filled 2021-03-28 – 2021-04-17 (×2): qty 9, 84d supply, fill #0

## 2021-03-29 ENCOUNTER — Telehealth: Payer: Self-pay

## 2021-03-29 NOTE — Telephone Encounter (Signed)
VMT pt ref next night time PREP class starting on 04/02/21-request call back to confirm interest or wait til next one? ? ?

## 2021-04-01 ENCOUNTER — Other Ambulatory Visit: Payer: Self-pay

## 2021-04-01 ENCOUNTER — Telehealth: Payer: Self-pay | Admitting: *Deleted

## 2021-04-01 DIAGNOSIS — G4733 Obstructive sleep apnea (adult) (pediatric): Secondary | ICD-10-CM | POA: Diagnosis not present

## 2021-04-01 LAB — TOXASSURE SELECT,+ANTIDEPR,UR

## 2021-04-01 MED ORDER — CLOPIDOGREL BISULFATE 75 MG PO TABS: 75.0000 mg | ORAL_TABLET | Freq: Every day | ORAL | 3 refills | Status: DC

## 2021-04-01 NOTE — Telephone Encounter (Signed)
Urine drug screen for this encounter is consistent for prescribed medication 

## 2021-04-02 DIAGNOSIS — E559 Vitamin D deficiency, unspecified: Secondary | ICD-10-CM | POA: Diagnosis not present

## 2021-04-02 DIAGNOSIS — E1165 Type 2 diabetes mellitus with hyperglycemia: Secondary | ICD-10-CM | POA: Diagnosis not present

## 2021-04-02 DIAGNOSIS — E1169 Type 2 diabetes mellitus with other specified complication: Secondary | ICD-10-CM | POA: Diagnosis not present

## 2021-04-02 DIAGNOSIS — E1122 Type 2 diabetes mellitus with diabetic chronic kidney disease: Secondary | ICD-10-CM | POA: Diagnosis not present

## 2021-04-04 DIAGNOSIS — E785 Hyperlipidemia, unspecified: Secondary | ICD-10-CM | POA: Diagnosis not present

## 2021-04-04 DIAGNOSIS — E559 Vitamin D deficiency, unspecified: Secondary | ICD-10-CM | POA: Diagnosis not present

## 2021-04-04 DIAGNOSIS — I10 Essential (primary) hypertension: Secondary | ICD-10-CM | POA: Diagnosis not present

## 2021-04-04 DIAGNOSIS — R300 Dysuria: Secondary | ICD-10-CM | POA: Diagnosis not present

## 2021-04-04 DIAGNOSIS — E1122 Type 2 diabetes mellitus with diabetic chronic kidney disease: Secondary | ICD-10-CM | POA: Diagnosis not present

## 2021-04-04 DIAGNOSIS — I252 Old myocardial infarction: Secondary | ICD-10-CM | POA: Diagnosis not present

## 2021-04-04 DIAGNOSIS — Z794 Long term (current) use of insulin: Secondary | ICD-10-CM | POA: Diagnosis not present

## 2021-04-12 DIAGNOSIS — E785 Hyperlipidemia, unspecified: Secondary | ICD-10-CM | POA: Diagnosis not present

## 2021-04-12 DIAGNOSIS — E1122 Type 2 diabetes mellitus with diabetic chronic kidney disease: Secondary | ICD-10-CM | POA: Diagnosis not present

## 2021-04-12 DIAGNOSIS — E1142 Type 2 diabetes mellitus with diabetic polyneuropathy: Secondary | ICD-10-CM | POA: Diagnosis not present

## 2021-04-12 DIAGNOSIS — I10 Essential (primary) hypertension: Secondary | ICD-10-CM | POA: Diagnosis not present

## 2021-04-17 ENCOUNTER — Other Ambulatory Visit (HOSPITAL_BASED_OUTPATIENT_CLINIC_OR_DEPARTMENT_OTHER): Payer: Self-pay

## 2021-04-22 ENCOUNTER — Ambulatory Visit: Payer: HMO | Admitting: Cardiology

## 2021-05-02 DIAGNOSIS — G4733 Obstructive sleep apnea (adult) (pediatric): Secondary | ICD-10-CM | POA: Diagnosis not present

## 2021-05-03 DIAGNOSIS — E1165 Type 2 diabetes mellitus with hyperglycemia: Secondary | ICD-10-CM | POA: Diagnosis not present

## 2021-05-03 DIAGNOSIS — E1169 Type 2 diabetes mellitus with other specified complication: Secondary | ICD-10-CM | POA: Diagnosis not present

## 2021-05-03 DIAGNOSIS — E1122 Type 2 diabetes mellitus with diabetic chronic kidney disease: Secondary | ICD-10-CM | POA: Diagnosis not present

## 2021-05-07 ENCOUNTER — Encounter (INDEPENDENT_AMBULATORY_CARE_PROVIDER_SITE_OTHER): Payer: Self-pay | Admitting: Ophthalmology

## 2021-05-07 ENCOUNTER — Ambulatory Visit (INDEPENDENT_AMBULATORY_CARE_PROVIDER_SITE_OTHER): Payer: Federal, State, Local not specified - PPO | Admitting: Ophthalmology

## 2021-05-07 DIAGNOSIS — H35073 Retinal telangiectasis, bilateral: Secondary | ICD-10-CM

## 2021-05-07 DIAGNOSIS — G4733 Obstructive sleep apnea (adult) (pediatric): Secondary | ICD-10-CM

## 2021-05-07 DIAGNOSIS — E113511 Type 2 diabetes mellitus with proliferative diabetic retinopathy with macular edema, right eye: Secondary | ICD-10-CM | POA: Diagnosis not present

## 2021-05-07 DIAGNOSIS — Z9989 Dependence on other enabling machines and devices: Secondary | ICD-10-CM | POA: Diagnosis not present

## 2021-05-07 MED ORDER — BEVACIZUMAB 2.5 MG/0.1ML IZ SOSY
2.5000 mg | PREFILLED_SYRINGE | INTRAVITREAL | Status: AC | PRN
  Administered 2021-05-07: 2.5 mg via INTRAVITREAL

## 2021-05-07 NOTE — Assessment & Plan Note (Signed)
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

## 2021-05-07 NOTE — Assessment & Plan Note (Signed)
OD recurrence of CSME now since patient has been off of CPAP for the last 4 months due to social factors. ? ?Patient awaiting new hose for the CPAP machine ? ?We need to control them CSME today with repeat injection Avastin. ?

## 2021-05-07 NOTE — Progress Notes (Signed)
? ? ?05/07/2021 ? ?  ? ?CHIEF COMPLAINT ?Patient presents for  ?Chief Complaint  ?Patient presents with  ? Diabetic Retinopathy with Macular Edema  ? ? ? ? ?HISTORY OF PRESENT ILLNESS: ?Tina [name] is a 51 y.o. female who presents to the clinic today for:  ? ?HPI   ?6 weeks Avastin OD, dilate OD, OCT. ?Patient states "I notice I am squinting a lot more. I haven't seen a regular eye doctor in so long. I also keep forgetting to tell him about a floater I have noticed that is new in my left eye, it has been there for about two months." ?LBS: 128, this morning. ? ? ?Patient attempting to nap upon my entering the room.  Thus very sleepy likely due to off of CPAP with no good sleep at night ?Last edited by Hurman Horn, MD on 05/07/2021 10:02 AM.  ?  ? ? ?Referring physician: ?[name], DO ?[address] ?[id number] ?[place],  [place] [address] ? ?HISTORICAL INFORMATION:  ? ?Selected notes from the [facility] ?  ? ?Lab Results  ?Component Value Date  ? HGBA1C 8.4 (H) 08/13/2020  ?  ? ?CURRENT MEDICATIONS: ?No current outpatient medications on file. (Ophthalmic Drugs)  ? ?No current facility-administered medications for this visit. (Ophthalmic Drugs)  ? ?Current Outpatient Medications (Other)  ?Medication Sig  ? albuterol (VENTOLIN HFA) 108 (90 Base) MCG/ACT inhaler Inhale 2 puffs into the lungs every 4 (four) hours as needed for wheezing or shortness of breath.  ? allopurinol (ZYLOPRIM) 100 MG tablet Take 100 mg by mouth at bedtime.  ? amLODipine (NORVASC) 5 MG tablet Take 1 tablet (5 mg total) by mouth daily.  ? aspirin EC 81 MG tablet Take 81 mg by mouth at bedtime. Swallow whole.  ? atorvastatin (LIPITOR) 40 MG tablet Take 1 tablet (40 mg total) by mouth at bedtime.  ? carvedilol (COREG) 25 MG tablet Take 25 mg by mouth 2 (two) times daily.  ? cetirizine (ZYRTEC) 10 MG tablet Take 10 mg by mouth daily as needed for allergies (itching).  ? cloNIDine (CATAPRES) 0.2 MG tablet Take 0.4 mg by mouth 2 (two)  times daily.  ? clopidogrel (PLAVIX) 75 MG tablet Take 1 tablet (75 mg total) by mouth daily.  ? Continuous Blood Gluc Sensor (FREESTYLE LIBRE 2 SENSOR) MISC Inject 1 Device into the skin every 14 (fourteen) days.  ? DULoxetine (CYMBALTA) 20 MG capsule Take 20-40 mg by mouth See admin instructions. Take 20 mg by mouth in the morning and 40 mg at bedtime  ? EPINEPHrine (AUVI-Q) 0.3 mg/0.3 mL IJ SOAJ injection Inject 0.3 mg into the muscle as needed for anaphylaxis.  ? furosemide (LASIX) 40 MG tablet Take 40 mg by mouth in the morning.  ? gabapentin (NEURONTIN) 300 MG capsule Take 300 mg by mouth at bedtime.  ? hydrALAZINE (APRESOLINE) 10 MG tablet Take 1 tablet (10 mg total) by mouth 3 (three) times daily.  ? Insulin Aspart FlexPen (NOVOLOG) 100 UNIT/ML Inject 0-25 Units into the skin 3 (three) times daily before meals. Dose per sliding scale.  ? Insulin Glargine (BASAGLAR KWIKPEN) 100 UNIT/ML Inject 22 Units into the skin at bedtime. (Patient taking differently: Inject 18 Units into the skin at bedtime.)  ? isosorbide mononitrate (IMDUR) 60 MG 24 hr tablet Take 1 tablet (60 mg total) by mouth daily.  ? JARDIANCE 10 MG TABS tablet Take 5-10 mg by mouth daily.  ? OZEMPIC, 2 MG/DOSE, 8 MG/3ML SOPN Inject 2  mg into the skin once a week.  ? Semaglutide, 2 MG/DOSE, (OZEMPIC, 2 MG/DOSE,) 8 MG/3ML SOPN 2 mg subcutaneous once weekly 28 days  ? tiZANidine (ZANAFLEX) 2 MG tablet Take 1 tablet (2 mg total) by mouth at bedtime.  ? traMADol (ULTRAM) 50 MG tablet Take 1 tablet (50 mg total) by mouth daily.  ? ?No current facility-administered medications for this visit. (Other)  ? ? ? ? ?REVIEW OF SYSTEMS: ? ? ? ?ALLERGIES ?Allergies  ?Allergen Reactions  ? Amlodipine Benzoate Swelling and Other (See Comments)  ?  Leg swelling ?Other reaction(s): leg swelling ?Other reaction(s): ? leg swelling at 10 mg dose  ? Contrast Media [Iodinated Contrast Media] Shortness Of Breath and Other (See Comments)  ?  Difficulty breathing  ? Iodine  Anaphylaxis  ?  Other reaction(s): Unknown ?Other reaction(s): Unknown  ? Iohexol Hives, Nausea And Vomiting, Swelling and Other (See Comments)  ?   Desc: Magnevist-gadolinium-difficulty breathing, throat swelling ?  ? Midazolam Hcl Anaphylaxis  ?  Difficulty breathing  ? Other Shortness Of Breath, Itching and Other (See Comments)  ?  Patient is allergic to all nuts except peanuts, which are NOT "nuts"- they are legumes  ? Shellfish Allergy Anaphylaxis  ? Valsartan Swelling  ?  Other reaction(s): Unknown ?Other reaction(s): Unknown  ? Metformin And Related Diarrhea, Nausea And Vomiting and Other (See Comments)  ?  Dehydration, also  ? Wilder Glade [Dapagliflozin] Nausea Only and Other (See Comments)  ?  UTI, headaches and muscle aches, also  ? Hydralazine Other (See Comments)  ?  Reaction not recalled ?Other reaction(s): Unknown ?Other reaction(s): Unknown  ? Saxagliptin Hives, Nausea And Vomiting and Other (See Comments)  ?  ONGLYZA- dehydration, also ?Other reaction(s): Unknown ?Other reaction(s): Unknown  ? Spironolactone Other (See Comments)  ?  Elevated potassium- Severe hyperkalemia ?Other reaction(s): severe hyperkalemia ?Other reaction(s): severe hyperkalemia  ? Avandia [Rosiglitazone Maleate] Hives and Other (See Comments)  ? Geodon [Ziprasidone] Other (See Comments)  ?  Reaction not recalled  ? Kiwi Extract Itching and Swelling  ? Latex Itching  ? ? ?PAST MEDICAL HISTORY ?Past Medical History:  ?Diagnosis Date  ? Allergy   ? Anemia   ? DURING MENSES--HAS HEAVY BLEEDING WITH PERODS  ? Angioedema 08/13/2020  ? Anxiety   ? Back pain, chronic   ? "ongoing"  ? Blood transfusion   ? IN 2012  AFTER C -SECTION  ? Cerebral thrombosis with cerebral infarction [facility]) 06/2009  ? RIGHT SIDED WEAKNESS ( ARM AND LEG ) AND SPASMS-remains with slight weakness and vertigo.  ? Constipation   ? Depression   ? Diabetes mellitus   ? Diabetic neuropathy ([place])   ? BOTH FEET --COMES AND GOES  ? Edema, lower extremity   ? Endometriosis    ? Fatty liver   ? GERD (gastroesophageal reflux disease)   ? with pregnancy  ? H/O eye surgery   ? Headache(784.0)   ? MIGRAINES--NOT REALLY HEADACHE-MORE LIKE PRESSURE SENSATION IN HEAD-FEELS DIZZIY AND  FAINT AS THE PRESSURE RESOLVES  ? Hernia, incisional, RLQ, s/p lap repair Sep 2013 09/02/2011  ? History of vertigo 03/22/2018  ? Hx of migraines 10/19/2011  ? Hyperlipidemia   ? Hypertension   ? Leg pain, right   ? "like bad Charley horse"  ? Multiple food allergies   ? Panic disorder without agoraphobia   ? Rash   ? HANDS, ARMS --STATES HX OF RASH EVER SINCE CHILDBIRTH/PREGNANCY.  STATES THE RASH OFTEN OCCURS WHEN SHE  IS REALLY STRESSED."goes and comes-presently left ring finger"  ? Restless leg syndrome   ? DIAGNOSED BY SLEEP STUDY - PT TOLD SHE DID NOT HAVE SLEEP APNEA  ? Right rotator cuff tear   ? PAIN IN RIGHT SHOULDER  ? SBO (small bowel obstruction) ([place]) 06/03/2012  ? Shortness of breath   ? Spastic hemiplegia affecting dominant side ([place])   ? Stomach problems   ? Stroke [facility])   ? Ventral hernia   ? RIGHT LOWER QUADRANT-CAUSING SOME PAIN  ? Weakness of right side of body   ? ?Past Surgical History:  ?Procedure Laterality Date  ? APPLICATION OF WOUND VAC N/A 05/25/2015  ? Procedure: APPLICATION OF WOUND VAC;  Surgeon: Michael Boston, MD;  Location: [facility];  Service: General;  Laterality: N/A;  ? CARDIAC CATHETERIZATION    ? CESAREAN SECTION  2012  ? COLONOSCOPY    ? CORONARY STENT INTERVENTION N/A 09/05/2020  ? Procedure: CORONARY STENT INTERVENTION;  Surgeon: Nigel Mormon, MD;  Location: [facility];  Service: Cardiovascular;  Laterality: N/A;  ? DIAGNOSTIC LAPAROSCOPY    ? ESOPHAGOGASTRODUODENOSCOPY N/A 06/03/2012  ? Procedure: ESOPHAGOGASTRODUODENOSCOPY (EGD);  Surgeon: [name] Craver, MD;  Location: [facility] ENDOSCOPY;  Service: Endoscopy;  Laterality: N/A;  ? EXCISION MASS ABDOMINAL N/A 05/25/2015  ? Procedure: ABDOMINAL WALL EXPLORATION EXCISION OF SEROMA REMOVAL OF REDUNDANT SKIN ;  Surgeon:  Michael Boston, MD;  Location: [facility];  Service: General;  Laterality: N/A;  ? EYE SURGERY    ? Eye laser for vessel hemorrhaging  ? fybroid removal    ? HERNIA REPAIR  10/03/2011  ? ventral hernia repai

## 2021-05-07 NOTE — Assessment & Plan Note (Signed)
Encouraged to resume CPAP use promptly to assist in preventing nightly hypoxic damage to the macula ?

## 2021-05-22 ENCOUNTER — Other Ambulatory Visit: Payer: Self-pay | Admitting: Cardiology

## 2021-05-29 DIAGNOSIS — E785 Hyperlipidemia, unspecified: Secondary | ICD-10-CM | POA: Diagnosis not present

## 2021-05-29 DIAGNOSIS — E1122 Type 2 diabetes mellitus with diabetic chronic kidney disease: Secondary | ICD-10-CM | POA: Diagnosis not present

## 2021-05-29 DIAGNOSIS — N1831 Chronic kidney disease, stage 3a: Secondary | ICD-10-CM | POA: Diagnosis not present

## 2021-05-29 DIAGNOSIS — I129 Hypertensive chronic kidney disease with stage 1 through stage 4 chronic kidney disease, or unspecified chronic kidney disease: Secondary | ICD-10-CM | POA: Diagnosis not present

## 2021-05-29 DIAGNOSIS — I2510 Atherosclerotic heart disease of native coronary artery without angina pectoris: Secondary | ICD-10-CM | POA: Diagnosis not present

## 2021-05-30 ENCOUNTER — Encounter
Payer: Federal, State, Local not specified - PPO | Attending: Physical Medicine & Rehabilitation | Admitting: Physical Medicine & Rehabilitation

## 2021-05-30 ENCOUNTER — Other Ambulatory Visit: Payer: Self-pay | Admitting: Nephrology

## 2021-05-30 ENCOUNTER — Encounter: Payer: Self-pay | Admitting: Physical Medicine & Rehabilitation

## 2021-05-30 VITALS — BP 147/84 | HR 76 | Temp 98.1°F | Ht 66.0 in | Wt 228.0 lb

## 2021-05-30 DIAGNOSIS — H832X9 Labyrinthine dysfunction, unspecified ear: Secondary | ICD-10-CM | POA: Insufficient documentation

## 2021-05-30 DIAGNOSIS — N1831 Chronic kidney disease, stage 3a: Secondary | ICD-10-CM

## 2021-05-30 NOTE — Patient Instructions (Signed)
Benign Positional Vertigo Vertigo is the feeling that you or your surroundings are moving when they are not. Benign positional vertigo is the most common form of vertigo. This is usually a harmless condition (benign). This condition is positional. This means that symptoms are triggered by certain movements and positions. This condition can be dangerous if it occurs while you are doing something that could cause harm to yourself or others. This includes activities such as driving or operating machinery. What are the causes? The inner ear has fluid-filled canals that help your brain sense movement and balance. When the fluid moves, the brain receives messages about your body's position. With benign positional vertigo, calcium crystals in the inner ear break free and disturb the inner ear area. This causes your brain to receive confusing messages about your body's position. What increases the risk? You are more likely to develop this condition if: You are a woman. You are 50 years of age or older. You have recently had a head injury. You have an inner ear disease. What are the signs or symptoms? Symptoms of this condition usually happen when you move your head or your eyes in different directions. Symptoms may start suddenly and usually last for less than a minute. They include: Loss of balance and falling. Feeling like you are spinning or moving. Feeling like your surroundings are spinning or moving. Nausea and vomiting. Blurred vision. Dizziness. Involuntary eye movement (nystagmus). Symptoms can be mild and cause only minor problems, or they can be severe and interfere with daily life. Episodes of benign positional vertigo may return (recur) over time. Symptoms may also improve over time. How is this diagnosed? This condition may be diagnosed based on: Your medical history. A physical exam of the head, neck, and ears. Positional tests to check for or stimulate vertigo. You may be asked to  turn your head and change positions, such as going from sitting to lying down. A health care provider will watch for symptoms of vertigo. You may be referred to a health care provider who specializes in ear, nose, and throat problems (ENT or otolaryngologist) or a provider who specializes in disorders of the nervous system (neurologist). How is this treated?  This condition may be treated in a session in which your health care provider moves your head in specific positions to help the displaced crystals in your inner ear move. Treatment for this condition may take several sessions. Surgery may be needed in severe cases, but this is rare. In some cases, benign positional vertigo may resolve on its own in 2-4 weeks. Follow these instructions at home: Safety Move slowly. Avoid sudden body or head movements or certain positions, as told by your health care provider. Avoid driving or operating machinery until your health care provider says it is safe. Avoid doing any tasks that would be dangerous to you or others if vertigo occurs. If you have trouble walking or keeping your balance, try using a cane for stability. If you feel dizzy or unstable, sit down right away. Return to your normal activities as told by your health care provider. Ask your health care provider what activities are safe for you. General instructions Take over-the-counter and prescription medicines only as told by your health care provider. Drink enough fluid to keep your urine pale yellow. Keep all follow-up visits. This is important. Contact a health care provider if: You have a fever. Your condition gets worse or you develop new symptoms. Your family or friends notice any behavioral changes.   You have nausea or vomiting that gets worse. You have numbness or a prickling and tingling sensation. Get help right away if you: Have difficulty speaking or moving. Are always dizzy or faint. Develop severe headaches. Have weakness in  your legs or arms. Have changes in your hearing or vision. Develop a stiff neck. Develop sensitivity to light. These symptoms may represent a serious problem that is an emergency. Do not wait to see if the symptoms will go away. Get medical help right away. Call your local emergency services (911 in the [place] Do not drive yourself to the hospital. Summary Vertigo is the feeling that you or your surroundings are moving when they are not. Benign positional vertigo is the most common form of vertigo. This condition is caused by calcium crystals in the inner ear that become displaced. This causes a disturbance in an area of the inner ear that helps your brain sense movement and balance. Symptoms include loss of balance and falling, feeling that you or your surroundings are moving, nausea and vomiting, and blurred vision. This condition can be diagnosed based on symptoms, a physical exam, and positional tests. Follow safety instructions as told by your health care provider and keep all follow-up visits. This is important. This information is not intended to replace advice given to you by your health care provider. Make sure you discuss any questions you have with your health care provider. Document Revised: 11/30/2019 Document Reviewed: 11/30/2019 Elsevier Patient Education  2023 [employer]  

## 2021-05-30 NOTE — Progress Notes (Signed)
Subjective:    Patient ID: Tina [name], female    DOB: 1970/11/22, 51 y.o.   MRN: [id number]  HPI 51 year old female with history of diabetes, hypertension and chronic bilateral pontine infarcts who returns today with 2 main complaints.  She does have low back pain primarily right upper buttocks.  She does have a history of sacroiliac dysfunction that has responded to sacroiliac injections.  She does have iohexol allergy so that she cannot receive contrast for this. She states that she can live with her back pain but her main complaint today is reduced balance.no hearing loss  Pt feels balance is a bit off, cannot do kick boxing  Pt fell 2 d ago while walking the dog no significant injury Has lost 28lb, intentional Worsening retinopathy with repeat Avastin injection in April   No bleeding noted in urine, stool or vaginal  Pain Inventory Average Pain 5 Pain Right Now 7 My pain is intermittent, constant, sharp, and burning  In the last 24 hours, has pain interfered with the following? General activity 8 Relation with others 8 Enjoyment of life 10 What TIME of day is your pain at its worst? night Sleep (in general) Poor  Pain is worse with: walking, bending, and standing Pain improves with: rest and medication Relief from Meds: 8  Family History  Problem Relation Age of Onset   Diabetes Father    Kidney disease Father    Depression Father    Drug abuse Father    Allergic rhinitis Mother    Eczema Mother    Urticaria Mother    Depression Mother    Anxiety disorder Mother    Bipolar disorder Mother    Alcoholism Mother    Drug abuse Mother    Eating disorder Mother    Diabetes Maternal Grandmother    Hyperlipidemia Paternal Grandmother    Stroke Paternal Grandmother    Eczema Sister    Urticaria Sister    Colon cancer Paternal Uncle    Other Neg Hx    Angioedema Neg Hx    Asthma Neg Hx    Colon polyps Neg Hx    Esophageal cancer Neg Hx    Rectal cancer Neg Hx     Stomach cancer Neg Hx    Social History   Socioeconomic History   Marital status: Married    Spouse name: Annie Main   Number of children: 3   Years of education: BA degree   Highest education level: Not on file  Occupational History   Occupation: stay at home mom    Employer: UNEMPLOYED  Tobacco Use   Smoking status: Never   Smokeless tobacco: Never  Vaping Use   Vaping Use: Never used  Substance and Sexual Activity   Alcohol use: No    Alcohol/week: 0.0 standard drinks   Drug use: No   Sexual activity: Yes    Birth control/protection: None  Other Topics Concern   Not on file  Social History Narrative   Patient is married with 2 children.   Patient is right handed.   Patient has her  BA degree.   Patient drinks 1 cup daily.   Social Determinants of Health   Financial Resource Strain: Not on file  Food Insecurity: Not on file  Transportation Needs: Not on file  Physical Activity: Not on file  Stress: Not on file  Social Connections: Not on file   Past Surgical History:  Procedure Laterality Date   APPLICATION OF WOUND VAC N/A 05/25/2015  Procedure: APPLICATION OF WOUND VAC;  Surgeon: Michael Boston, MD;  Location: [facility];  Service: General;  Laterality: N/A;   CARDIAC CATHETERIZATION     CESAREAN SECTION  2012   COLONOSCOPY     CORONARY STENT INTERVENTION N/A 09/05/2020   Procedure: CORONARY STENT INTERVENTION;  Surgeon: Nigel Mormon, MD;  Location: [place] [facility];  Service: Cardiovascular;  Laterality: N/A;   DIAGNOSTIC LAPAROSCOPY     ESOPHAGOGASTRODUODENOSCOPY N/A 06/03/2012   Procedure: ESOPHAGOGASTRODUODENOSCOPY (EGD);  Surgeon: [name] Craver, MD;  Location: [facility];  Service: Endoscopy;  Laterality: N/A;   EXCISION MASS ABDOMINAL N/A 05/25/2015   Procedure: ABDOMINAL WALL EXPLORATION EXCISION OF SEROMA REMOVAL OF REDUNDANT SKIN ;  Surgeon: Michael Boston, MD;  Location: [facility];  Service: General;  Laterality: N/A;   EYE SURGERY     Eye laser  for vessel hemorrhaging   fybroid removal     HERNIA REPAIR  10/03/2011   ventral hernia repair   INSERTION OF MESH N/A 02/04/2013   Procedure: INSERTION OF MESH;  Surgeon: Adin Hector, MD;  Location: [facility];  Service: General;  Laterality: N/A;   INTRAVASCULAR ULTRASOUND/IVUS N/A 09/05/2020   Procedure: Intravascular Ultrasound/IVUS;  Surgeon: Nigel Mormon, MD;  Location: [place] [facility];  Service: Cardiovascular;  Laterality: N/A;   LEFT HEART CATH AND CORONARY ANGIOGRAPHY N/A 09/05/2020   Procedure: LEFT HEART CATH AND CORONARY ANGIOGRAPHY;  Surgeon: Nigel Mormon, MD;  Location: [place] [facility];  Service: Cardiovascular;  Laterality: N/A;   UMBILICAL HERNIA REPAIR N/A 02/04/2013   Procedure: LAPAROSCOPIC ventral wall hernia repair LAPAROSCOPIC LYSIS OF ADHESIONS laparoscopic exploration of abdomen ;  Surgeon: Adin Hector, MD;  Location: [facility];  Service: General;  Laterality: N/A;   UPPER GASTROINTESTINAL ENDOSCOPY     URETER REVISION     Bilateral "twisted"   UTERINE FIBROID SURGERY     2 SURGERIES FOR FIBROIDS   VENTRAL HERNIA REPAIR  10/03/2011   Procedure: LAPAROSCOPIC VENTRAL HERNIA;  Surgeon: Adin Hector, MD;  Location: [facility];  Service: General;  Laterality: N/A;   Past Surgical History:  Procedure Laterality Date   APPLICATION OF WOUND VAC N/A 05/25/2015   Procedure: APPLICATION OF WOUND VAC;  Surgeon: Michael Boston, MD;  Location: [facility];  Service: General;  Laterality: N/A;   CARDIAC CATHETERIZATION     CESAREAN SECTION  2012   COLONOSCOPY     CORONARY STENT INTERVENTION N/A 09/05/2020   Procedure: CORONARY STENT INTERVENTION;  Surgeon: Nigel Mormon, MD;  Location: [place] [facility];  Service: Cardiovascular;  Laterality: N/A;   DIAGNOSTIC LAPAROSCOPY     ESOPHAGOGASTRODUODENOSCOPY N/A 06/03/2012   Procedure: ESOPHAGOGASTRODUODENOSCOPY (EGD);  Surgeon: [name] Craver, MD;  Location: [facility];  Service: Endoscopy;  Laterality: N/A;    EXCISION MASS ABDOMINAL N/A 05/25/2015   Procedure: ABDOMINAL WALL EXPLORATION EXCISION OF SEROMA REMOVAL OF REDUNDANT SKIN ;  Surgeon: Michael Boston, MD;  Location: [facility];  Service: General;  Laterality: N/A;   EYE SURGERY     Eye laser for vessel hemorrhaging   fybroid removal     HERNIA REPAIR  10/03/2011   ventral hernia repair   INSERTION OF MESH N/A 02/04/2013   Procedure: INSERTION OF MESH;  Surgeon: Adin Hector, MD;  Location: [facility];  Service: General;  Laterality: N/A;   INTRAVASCULAR ULTRASOUND/IVUS N/A 09/05/2020   Procedure: Intravascular Ultrasound/IVUS;  Surgeon: Nigel Mormon, MD;  Location: [place] [facility];  Service: Cardiovascular;  Laterality: N/A;   LEFT HEART CATH AND CORONARY ANGIOGRAPHY N/A 09/05/2020   Procedure: LEFT HEART CATH AND CORONARY ANGIOGRAPHY;  Surgeon: Nigel Mormon, MD;  Location: [place] [facility];  Service: Cardiovascular;  Laterality: N/A;   UMBILICAL HERNIA REPAIR N/A 02/04/2013   Procedure: LAPAROSCOPIC ventral wall hernia repair LAPAROSCOPIC LYSIS OF ADHESIONS laparoscopic exploration of abdomen ;  Surgeon: Adin Hector, MD;  Location: [facility];  Service: General;  Laterality: N/A;   UPPER GASTROINTESTINAL ENDOSCOPY     URETER REVISION     Bilateral "twisted"   UTERINE FIBROID SURGERY     2 SURGERIES FOR FIBROIDS   VENTRAL HERNIA REPAIR  10/03/2011   Procedure: LAPAROSCOPIC VENTRAL HERNIA;  Surgeon: Adin Hector, MD;  Location: [facility];  Service: General;  Laterality: N/A;   Past Medical History:  Diagnosis Date   Allergy    Anemia    DURING MENSES--HAS HEAVY BLEEDING WITH PERODS   Angioedema 08/13/2020   Anxiety    Back pain, chronic    "ongoing"   Blood transfusion    IN 2012  AFTER C -SECTION   Cerebral thrombosis with cerebral infarction ([place]) 06/2009   RIGHT SIDED WEAKNESS ( ARM AND LEG ) AND SPASMS-remains with slight weakness and vertigo.   Constipation    Depression    Diabetes mellitus    Diabetic  neuropathy (HCC)    BOTH FEET --COMES AND GOES   Edema, lower extremity    Endometriosis    Fatty liver    GERD (gastroesophageal reflux disease)    with pregnancy   H/O eye surgery    Headache(784.0)    MIGRAINES--NOT REALLY HEADACHE-MORE LIKE PRESSURE SENSATION IN HEAD-FEELS DIZZIY AND  FAINT AS THE PRESSURE RESOLVES   Hernia, incisional, RLQ, s/p lap repair Sep 2013 09/02/2011   History of vertigo 03/22/2018   Hx of migraines 10/19/2011   Hyperlipidemia    Hypertension    Leg pain, right    "like bad Charley horse"   Multiple food allergies    Panic disorder without agoraphobia    Rash    HANDS, ARMS --STATES HX OF RASH [place].  STATES THE RASH OFTEN OCCURS WHEN SHE IS REALLY STRESSED."goes and comes-presently left ring finger"   Restless leg syndrome    DIAGNOSED BY SLEEP STUDY - PT TOLD SHE DID NOT HAVE SLEEP APNEA   Right rotator cuff tear    PAIN IN RIGHT SHOULDER   SBO (small bowel obstruction) ([place]) 06/03/2012   Shortness of breath    Spastic hemiplegia affecting dominant side (HCC)    Stomach problems    Stroke (HCC)    Ventral hernia    RIGHT LOWER QUADRANT-CAUSING SOME PAIN   Weakness of right side of body    BP (!) 147/84   Pulse 76   Temp 98.1 F (36.7 C)   Ht '5\' 6"'$  (1.676 m)   Wt 228 lb (103.4 kg)   SpO2 98%   BMI 36.80 kg/m   Opioid Risk Score:   Fall Risk Score:  `1  Depression screen [facility] 2/9     03/26/2021   10:21 AM 01/02/2021    4:30 PM 12/25/2020    2:14 PM 11/09/2020    4:15 PM 11/02/2020   10:46 AM 10/30/2020    1:59 PM 07/27/2019    1:45 PM  Depression screen PHQ 2/9  Decreased Interest 1 0 1 3 0 1 0  Down, Depressed, Hopeless 1 0 '1 3 1 1 '$ 1  PHQ - 2 Score 2 0 '2 6 1 2 1  '$ Altered sleeping    3     Tired, decreased energy    3     Change in appetite    2     Feeling bad or failure about yourself     0     Trouble concentrating    2     Moving slowly or fidgety/restless    1     Suicidal thoughts    0      PHQ-9 Score    17     Difficult doing work/chores    Somewhat difficult       Review of Systems  Musculoskeletal:  Positive for back pain.       Pain in right shoulder, left lower leg pain  All other systems reviewed and are negative.     Objective:   Physical Exam Vitals and nursing note reviewed.  Constitutional:      Appearance: She is obese.  HENT:     Head: Normocephalic and atraumatic.  Neurological:     Mental Status: She is alert and oriented to person, place, and time.     Comments: RIght side 4/5 in UE and LE Left side 5/5  in UE and LE  Psychiatric:        Mood and Affect: Mood normal.        Behavior: Behavior normal.  Pain to Palp over the Right SI area  Left Side lying to sit reproduces dizziness, has horz nystagmus that clears after a few beats, "room spinning" stops after 1-2 min sitting at edge of exam table         Assessment & Plan:   Hx of Bilateral pontine infarcts with residual mild RIght hemiparesis Benign positional vertigo referral to PT Right sacroiliac joint pain - cont stretching exercises, consider repeat SI injection without contrast

## 2021-05-31 DIAGNOSIS — Z8719 Personal history of other diseases of the digestive system: Secondary | ICD-10-CM | POA: Diagnosis not present

## 2021-05-31 DIAGNOSIS — Z09 Encounter for follow-up examination after completed treatment for conditions other than malignant neoplasm: Secondary | ICD-10-CM | POA: Diagnosis not present

## 2021-06-01 DIAGNOSIS — G4733 Obstructive sleep apnea (adult) (pediatric): Secondary | ICD-10-CM | POA: Diagnosis not present

## 2021-06-02 DIAGNOSIS — E1122 Type 2 diabetes mellitus with diabetic chronic kidney disease: Secondary | ICD-10-CM | POA: Diagnosis not present

## 2021-06-02 DIAGNOSIS — E1169 Type 2 diabetes mellitus with other specified complication: Secondary | ICD-10-CM | POA: Diagnosis not present

## 2021-06-02 DIAGNOSIS — E1165 Type 2 diabetes mellitus with hyperglycemia: Secondary | ICD-10-CM | POA: Diagnosis not present

## 2021-06-05 ENCOUNTER — Encounter (HOSPITAL_COMMUNITY): Payer: Self-pay

## 2021-06-05 ENCOUNTER — Ambulatory Visit: Payer: Federal, State, Local not specified - PPO

## 2021-06-05 ENCOUNTER — Ambulatory Visit (HOSPITAL_COMMUNITY)
Admission: EM | Admit: 2021-06-05 | Discharge: 2021-06-05 | Disposition: A | Discharge status: Home / Self Care | Payer: Federal, State, Local not specified - PPO | Attending: Physician Assistant | Admitting: Physician Assistant

## 2021-06-05 DIAGNOSIS — R197 Diarrhea, unspecified: Secondary | ICD-10-CM | POA: Insufficient documentation

## 2021-06-05 DIAGNOSIS — R1084 Generalized abdominal pain: Secondary | ICD-10-CM | POA: Insufficient documentation

## 2021-06-05 LAB — COMPREHENSIVE METABOLIC PANEL
ALT: 15 U/L (ref 0–44)
AST: 18 U/L (ref 15–41)
Albumin: 3.2 g/dL — ABNORMAL LOW (ref 3.5–5.0)
Alkaline Phosphatase: 49 U/L (ref 38–126)
Anion gap: 9 (ref 5–15)
BUN: 9 mg/dL (ref 6–20)
CO2: 24 mmol/L (ref 22–32)
Calcium: 8.9 mg/dL (ref 8.9–10.3)
Chloride: 107 mmol/L (ref 98–111)
Creatinine, Ser: 1.06 mg/dL — ABNORMAL HIGH (ref 0.44–1.00)
GFR, Estimated: >60 mL/min (ref >60)
Glucose, Bld: 103 mg/dL — ABNORMAL HIGH (ref 70–99)
Potassium: 3.7 mmol/L (ref 3.5–5.1)
Sodium: 140 mmol/L (ref 135–145)
Total Bilirubin: 0.9 mg/dL (ref 0.3–1.2)
Total Protein: 6.8 g/dL (ref 6.5–8.1)

## 2021-06-05 LAB — LIPASE, BLOOD: Lipase: 26 U/L (ref 11–51)

## 2021-06-05 LAB — CBC WITH DIFFERENTIAL/PLATELET
Abs Immature Granulocytes: 0.02 10*3/uL (ref 0.00–0.07)
Basophils Absolute: 0.1 10*3/uL (ref 0.0–0.1)
Basophils Relative: 1 %
Eosinophils Absolute: 1.3 10*3/uL — ABNORMAL HIGH (ref 0.0–0.5)
Eosinophils Relative: 15 %
HCT: 43.8 % (ref 36.0–46.0)
Hemoglobin: 14.5 g/dL (ref 12.0–15.0)
Immature Granulocytes: 0 %
Lymphocytes Relative: 19 %
Lymphs Abs: 1.7 10*3/uL (ref 0.7–4.0)
MCH: 27.5 pg (ref 26.0–34.0)
MCHC: 33.1 g/dL (ref 30.0–36.0)
MCV: 83 fL (ref 80.0–100.0)
Monocytes Absolute: 0.5 10*3/uL (ref 0.1–1.0)
Monocytes Relative: 6 %
Neutro Abs: 5.1 10*3/uL (ref 1.7–7.7)
Neutrophils Relative %: 59 %
Platelets: 342 10*3/uL (ref 150–400)
RBC: 5.28 MIL/uL — ABNORMAL HIGH (ref 3.87–5.11)
RDW: 14 % (ref 11.5–15.5)
WBC: 8.8 10*3/uL (ref 4.0–10.5)
nRBC: 0 % (ref 0.0–0.2)

## 2021-06-05 NOTE — Discharge Instructions (Signed)
I am concerned that you might have an infection causing your symptoms.  When you can, please bring back the stool studies.  I will contact you through MyChart with your results but contact you if there is anything concerning.  I do want you to follow-up with a GI specialist.  Please call to schedule appointment as soon as possible.  As we discussed, we do not have the imaging ability in urgent care so if you develop any worsening symptoms including fever, severe abdominal pain, nausea/vomiting, blood in your stool, weakness, chest pain, shortness of breath you need to go to the emergency room immediately.

## 2021-06-05 NOTE — ED Triage Notes (Signed)
Greater than 2 wk h/o abdominal pain, nausea, diarrhea and insomnia. Has been taking immodium with temporary relief. Notes increased gas and belching that smells like "rotten eggs". Pt reports when she eats she can hear the food moving through her stomach before she has a BM.

## 2021-06-05 NOTE — ED Provider Notes (Signed)
[facility]    CSN: [id number] Arrival date & time: 06/05/21  6295      History   Chief Complaint Chief Complaint  Patient presents with   Diarrhea    HPI Tina [name] is a 51 y.o. female.   Patient presents today with a 3-week history of generalized abdominal pain and diarrhea.  She was seen by her primary care when symptoms began at which point she was instructed to start taking Imodium.  She reports this provides some temporary relief of symptoms but has not provided any resolution.  She describes bowel movements as of 5+ watery bowel movements per day without blood or mucus.  She reports generalized abdominal pain that is worse on the right side and worse following a meal prior to having a bowel movement.  She reports increased belching and flatulence.  She does have a history of GERD as well as peptic ulcer disease but has not seen a GI specialist recently.  She denies any history of ulcerative colitis or Crohn's disease.  Denies any recent travel, known sick contacts, recent antibiotic use, suspicious food intake.  She denies any nausea, vomiting, fever, chest pain, shortness of breath.  She is having difficulty with daily duties as a result of symptoms.   Past Medical History:  Diagnosis Date   Allergy    Anemia    DURING MENSES--HAS HEAVY BLEEDING WITH PERODS   Angioedema 08/13/2020   Anxiety    Back pain, chronic    "ongoing"   Blood transfusion    IN 2012  AFTER C -SECTION   Cerebral thrombosis with cerebral infarction ([place]) 06/2009   RIGHT SIDED WEAKNESS ( ARM AND LEG ) AND SPASMS-remains with slight weakness and vertigo.   Constipation    Depression    Diabetes mellitus    Diabetic neuropathy (HCC)    BOTH FEET --COMES AND GOES   Edema, lower extremity    Endometriosis    Fatty liver    GERD (gastroesophageal reflux disease)    with pregnancy   H/O eye surgery    Headache(784.0)    MIGRAINES--NOT REALLY HEADACHE-MORE LIKE PRESSURE SENSATION  IN HEAD-FEELS DIZZIY AND  FAINT AS THE PRESSURE RESOLVES   Hernia, incisional, RLQ, s/p lap repair Sep 2013 09/02/2011   History of vertigo 03/22/2018   Hx of migraines 10/19/2011   Hyperlipidemia    Hypertension    Leg pain, right    "like bad Charley horse"   Multiple food allergies    Panic disorder without agoraphobia    Rash    HANDS, ARMS --STATES HX OF RASH [place].  STATES THE RASH OFTEN OCCURS WHEN SHE IS REALLY STRESSED."goes and comes-presently left ring finger"   Restless leg syndrome    DIAGNOSED BY SLEEP STUDY - PT TOLD SHE DID NOT HAVE SLEEP APNEA   Right rotator cuff tear    PAIN IN RIGHT SHOULDER   SBO (small bowel obstruction) ([place]) 06/03/2012   Shortness of breath    Spastic hemiplegia affecting dominant side (HCC)    Stomach problems    Stroke (HCC)    Ventral hernia    RIGHT LOWER QUADRANT-CAUSING SOME PAIN   Weakness of right side of body     Patient Active Problem List   Diagnosis Date Noted   Hypertensive emergency 09/09/2020   Chest pain    Elevated troponin    Status post coronary artery stent placement    Non-ST elevation (NSTEMI) myocardial infarction ([place]) 09/05/2020  NSTEMI (non-ST elevated myocardial infarction) ([place]) 09/05/2020   Hypersomnia, persistent 09/04/2020   OSA on CPAP 09/04/2020   Shortness of breath 08/18/2020   Allergy to alpha-gal 08/18/2020   Seasonal and perennial allergic rhinitis 08/18/2020   Allergy with anaphylaxis due to food 08/18/2020   Pruritus 08/18/2020   Pollen-food allergy 08/18/2020   Allergic reaction 08/13/2020   Angioedema 08/13/2020   Type 2 macular telangiectasis, bilateral 01/03/2020   Excessive daytime sleepiness 09/12/2019   Sleep paralysis 09/12/2019   Hypnopompic hallucination 09/12/2019   Abnormal dreams 09/12/2019   Diabetic macular edema of right eye with proliferative retinopathy associated with type 2 diabetes mellitus ([place]) 07/04/2019   Stable treated proliferative  diabetic retinopathy of left eye determined by examination associated with type 2 diabetes mellitus ([place]) 07/04/2019   Long-term insulin use ([place]) 04/07/2019   Depression 04/13/2018   History of vertigo 03/22/2018   Laboratory examination 03/22/2018   Chronic diastolic (congestive) heart failure ([place]) 03/22/2018   Other hyperlipidemia 02/15/2018   Vitamin D deficiency 12/31/2017   Class 2 severe obesity with serious comorbidity and body mass index (BMI) of 39.0 to 39.9 in adult ([place]) 11/18/2017   Gait disturbance, post-stroke 04/23/2017   Strain of gluteus medius 04/23/2017   OSA (obstructive sleep apnea) 10/17/2015   Periodic limb movement disorder (PLMD) 10/17/2015   Essential hypertension 05/26/2015   Diabetes mellitus ([place]) 05/26/2015   Abdominal wall mass of right lower quadrant 05/25/2015   Hemiparesis affecting right side as late effect of stroke ([place]) 11/09/2014   Abdominal wall seroma    Sepsis due to undetermined organism ([place]) 03/06/2014   Nausea vomiting and diarrhea 03/06/2014   Sepsis ([place]) 03/06/2014   Tension headache 09/01/2013   Nausea with vomiting ?Gastroparesis? 03/21/2013   Spastic hemiplegia affecting dominant side ([place]) 02/16/2013   Recurrent ventral incisional hernia s/p closure/repair w mesh 02/04/2013 01/26/2013   Constipation, chronic 01/26/2013   Abdominal pain, chronic, right lower quadrant 06/03/2012   History of CVA (cerebrovascular accident) 06/03/2012   HTN (hypertension) 06/03/2012   Diabetes mellitus type II, uncontrolled ([place]) 10/16/2011   Obesity (BMI 30-39.9) 09/02/2011   Rotator cuff tear 08/25/2011   Sciatica 06/10/2011   Paresthesias in right hand 06/10/2011   Lumbago 05/09/2011   Paresthesias 05/09/2011    Past Surgical History:  Procedure Laterality Date   APPLICATION OF WOUND VAC N/A 05/25/2015   Procedure: APPLICATION OF WOUND VAC;  Surgeon: Michael Boston, MD;  Location: [facility];  Service: General;  Laterality: N/A;   CARDIAC  CATHETERIZATION     CESAREAN SECTION  2012   COLONOSCOPY     CORONARY STENT INTERVENTION N/A 09/05/2020   Procedure: CORONARY STENT INTERVENTION;  Surgeon: Nigel Mormon, MD;  Location: [facility];  Service: Cardiovascular;  Laterality: N/A;   DIAGNOSTIC LAPAROSCOPY     ESOPHAGOGASTRODUODENOSCOPY N/A 06/03/2012   Procedure: ESOPHAGOGASTRODUODENOSCOPY (EGD);  Surgeon: [name] Craver, MD;  Location: [facility];  Service: Endoscopy;  Laterality: N/A;   EXCISION MASS ABDOMINAL N/A 05/25/2015   Procedure: ABDOMINAL WALL EXPLORATION EXCISION OF SEROMA REMOVAL OF REDUNDANT SKIN ;  Surgeon: Michael Boston, MD;  Location: [facility];  Service: General;  Laterality: N/A;   EYE SURGERY     Eye laser for vessel hemorrhaging   fybroid removal     HERNIA REPAIR  10/03/2011   ventral hernia repair   INSERTION OF MESH N/A 02/04/2013   Procedure: INSERTION OF MESH;  Surgeon: Adin Hector, MD;  Location: [facility];  Service: General;  Laterality: N/A;   INTRAVASCULAR ULTRASOUND/IVUS N/A 09/05/2020   Procedure: Intravascular Ultrasound/IVUS;  Surgeon: Nigel Mormon, MD;  Location: [facility];  Service: Cardiovascular;  Laterality: N/A;   LEFT HEART CATH AND CORONARY ANGIOGRAPHY N/A 09/05/2020   Procedure: LEFT HEART CATH AND CORONARY ANGIOGRAPHY;  Surgeon: Nigel Mormon, MD;  Location: [facility];  Service: Cardiovascular;  Laterality: N/A;   UMBILICAL HERNIA REPAIR N/A 02/04/2013   Procedure: LAPAROSCOPIC ventral wall hernia repair LAPAROSCOPIC LYSIS OF ADHESIONS laparoscopic exploration of abdomen ;  Surgeon: Adin Hector, MD;  Location: [facility];  Service: General;  Laterality: N/A;   UPPER GASTROINTESTINAL ENDOSCOPY     URETER REVISION     Bilateral "twisted"   UTERINE FIBROID SURGERY     2 SURGERIES FOR FIBROIDS   VENTRAL HERNIA REPAIR  10/03/2011   Procedure: LAPAROSCOPIC VENTRAL HERNIA;  Surgeon: Adin Hector, MD;  Location: [facility];  Service: General;   Laterality: N/A;    OB History     Gravida  2   Para  2   Term  0   Preterm  1   AB  0   Living  1      SAB  0   IAB  0   Ectopic  0   Multiple  0   Live Births               Home Medications    Prior to Admission medications   Medication Sig Start Date End Date Taking? Authorizing Provider  albuterol (VENTOLIN HFA) 108 (90 Base) MCG/ACT inhaler Inhale 2 puffs into the lungs every 4 (four) hours as needed for wheezing or shortness of breath. 08/16/20   Dara Hoyer, FNP  allopurinol (ZYLOPRIM) 100 MG tablet Take 100 mg by mouth at bedtime. 03/13/14   [provider]  amLODipine (NORVASC) 5 MG tablet Take 1 tablet (5 mg total) by mouth daily. 09/06/20 02/18/21  Tolia, Sunit, DO  aspirin EC 81 MG tablet Take 81 mg by mouth at bedtime. Swallow whole.    [provider]  atorvastatin (LIPITOR) 40 MG tablet Take 1 tablet (40 mg total) by mouth at bedtime. 09/06/20 02/18/21  Tolia, Sunit, DO  carvedilol (COREG) 25 MG tablet Take 25 mg by mouth 2 (two) times daily. 04/02/20   [provider]  cetirizine (ZYRTEC) 10 MG tablet Take 10 mg by mouth daily as needed for allergies (itching). 08/16/20   [provider]  cloNIDine (CATAPRES) 0.2 MG tablet Take 0.4 mg by mouth 2 (two) times daily. 08/13/19   [provider]  clopidogrel (PLAVIX) 75 MG tablet Take 1 tablet (75 mg total) by mouth daily. 04/01/21   Tolia, Sunit, DO  Continuous Blood Gluc Sensor (FREESTYLE LIBRE 2 SENSOR) MISC Inject 1 Device into the skin every 14 (fourteen) days. 08/23/19   [provider]  DULoxetine (CYMBALTA) 20 MG capsule Take 20-40 mg by mouth See admin instructions. Take 20 mg by mouth in the morning and 40 mg at bedtime    [provider]  EPINEPHrine (AUVI-Q) 0.3 mg/0.3 mL IJ SOAJ injection Inject 0.3 mg into the muscle as needed for anaphylaxis. 08/16/20   Dara Hoyer, FNP  furosemide (LASIX) 40 MG tablet Take 40 mg by mouth in the morning.     [provider]  gabapentin (NEURONTIN) 300 MG capsule Take 300 mg by mouth at bedtime. 12/14/20   [provider]  hydrALAZINE (APRESOLINE) 10 MG tablet  Take 1 tablet (10 mg total) by mouth 3 (three) times daily. 02/18/21   Tolia, Sunit, DO  Insulin Aspart FlexPen (NOVOLOG) 100 UNIT/ML Inject 0-25 Units into the skin 3 (three) times daily before meals. Dose per sliding scale. 09/20/20   [provider]  Insulin Glargine (BASAGLAR KWIKPEN) 100 UNIT/ML Inject 22 Units into the skin at bedtime. Patient taking differently: Inject 18 Units into the skin at bedtime. 08/15/20   Domenic Polite, MD  isosorbide mononitrate (IMDUR) 60 MG 24 hr tablet TAKE 1 TABLET BY MOUTH DAILY 05/22/21   Tolia, Sunit, DO  JARDIANCE 10 MG TABS tablet Take 5-10 mg by mouth daily. 11/22/20   [provider]  OZEMPIC, 2 MG/DOSE, 8 MG/3ML SOPN Inject 2 mg into the skin once a week. 12/12/20   [provider]  Semaglutide, 2 MG/DOSE, (OZEMPIC, 2 MG/DOSE,) 8 MG/3ML SOPN 2 mg subcutaneous once weekly 28 days 03/28/21     tiZANidine (ZANAFLEX) 2 MG tablet Take 1 tablet (2 mg total) by mouth at bedtime. 02/18/21   Kirsteins, Luanna Salk, MD  traMADol (ULTRAM) 50 MG tablet Take 1 tablet (50 mg total) by mouth daily. 03/26/21   Kirsteins, Luanna Salk, MD  Vitamin D, Cholecalciferol, 25 MCG (1000 UT) CAPS 1 capsule 04/04/21   [provider]  diphenhydrAMINE (BENADRYL) 25 MG tablet Take 1 tablet (25 mg total) by mouth every 8 (eight) hours as needed for up to 2 days for itching. 08/14/20 08/15/20  Domenic Polite, MD    Family History Family History  Problem Relation Age of Onset   Diabetes Father    Kidney disease Father    Depression Father    Drug abuse Father    Allergic rhinitis Mother    Eczema Mother    Urticaria Mother    Depression Mother    Anxiety disorder Mother    Bipolar disorder Mother    Alcoholism Mother    Drug abuse Mother    Eating disorder Mother    Diabetes Maternal  Grandmother    Hyperlipidemia Paternal Grandmother    Stroke Paternal Grandmother    Eczema Sister    Urticaria Sister    Colon cancer Paternal Uncle    Other Neg Hx    Angioedema Neg Hx    Asthma Neg Hx    Colon polyps Neg Hx    Esophageal cancer Neg Hx    Rectal cancer Neg Hx    Stomach cancer Neg Hx     Social History Social History   Tobacco Use   Smoking status: Never   Smokeless tobacco: Never  Vaping Use   Vaping Use: Never used  Substance Use Topics   Alcohol use: No    Alcohol/week: 0.0 standard drinks   Drug use: No     Allergies   Amlodipine benzoate, Contrast media [iodinated contrast media], Iodine, Iohexol, Midazolam hcl, Other, Shellfish allergy, Shellfish-derived products, Valsartan, Metformin and related, Farxiga [dapagliflozin], Hydralazine, Saxagliptin, Spironolactone, Avandia [rosiglitazone maleate], Geodon [ziprasidone], Kiwi extract, and Latex   Review of Systems Review of Systems  Constitutional:  Positive for activity change and appetite change. Negative for fatigue and fever.  HENT:  Negative for congestion.   Respiratory:  Negative for cough and shortness of breath.   Cardiovascular:  Negative for chest pain.  Gastrointestinal:  Positive for abdominal pain, diarrhea and nausea. Negative for blood in stool, constipation and vomiting.  Neurological:  Negative for dizziness, light-headedness and headaches.    Physical Exam Triage Vital Signs ED Triage  Vitals [06/05/21 1031]  Enc Vitals Group     BP (!) 169/98     Pulse Rate 77     Resp 17     Temp 97.9 F (36.6 C)     Temp Source Oral     SpO2 98 %     Weight      Height      Head Circumference      Peak Flow      Pain Score 8     Pain Loc      Pain Edu?      Excl. in [place]?    No data found.  Updated Vital Signs BP (!) 169/98 (BP Location: Right Arm)   Pulse 77   Temp 97.9 F (36.6 C) (Oral)   Resp 17   SpO2 98%   Visual Acuity Right Eye Distance:   Left Eye Distance:    Bilateral Distance:    Right Eye Near:   Left Eye Near:    Bilateral Near:     Physical Exam Vitals reviewed.  Constitutional:      General: She is awake. She is not in acute distress.    Appearance: Normal appearance. She is well-developed. She is not ill-appearing.     Comments: Very pleasant female appears stated age in no acute distress sitting comfortably in exam room  HENT:     Head: Normocephalic and atraumatic.  Cardiovascular:     Rate and Rhythm: Normal rate and regular rhythm.     Heart sounds: Normal heart sounds, S1 normal and S2 normal. No murmur heard. Pulmonary:     Effort: Pulmonary effort is normal.     Breath sounds: Normal breath sounds. No wheezing, rhonchi or rales.     Comments: Clear to auscultation bilaterally Abdominal:     General: Bowel sounds are normal.     Palpations: Abdomen is soft.     Tenderness: There is generalized abdominal tenderness. There is no right CVA tenderness, left CVA tenderness, guarding or rebound.     Comments: Generalized tenderness palpation without evidence of acute abdomen; worse on right  Psychiatric:        Behavior: Behavior is cooperative.     UC Treatments / Results  Labs (all labs ordered are listed, but only abnormal results are displayed) Labs Reviewed  C DIFFICILE QUICK SCREEN W PCR REFLEX    GASTROINTESTINAL PANEL BY PCR, STOOL (REPLACES STOOL CULTURE)  CBC WITH DIFFERENTIAL/PLATELET  COMPREHENSIVE METABOLIC PANEL  LIPASE, BLOOD    EKG   Radiology No results found.  Procedures Procedures (including critical care time)  Medications Ordered in UC Medications - No data to display  Initial Impression / Assessment and Plan / UC Course  I have reviewed the triage vital signs and the nursing notes.  Pertinent labs & imaging results that were available during my care of the patient were reviewed by me and considered in my medical decision making (see chart for details).     Vital signs and physical  exam reassuring today; no indication for emergent evaluation or imaging.  CBC, CMP, lipase obtained today-results pending.  Requested stool studies to rule for factious etiology; patient was unable to give a stool specimen during clinic visit but will return this as soon as possible for further evaluation.  Recommended that she eat a bland diet and drink plenty of fluids.  Discussed that given ongoing symptoms she should follow-up with a GI specialist and was given contact information for a local provider  with instruction to call and schedule an appointment as soon as possible.  Discussed that we do not have imaging capabilities in urgent care and if she has any worsening symptoms including increased abdominal pain, melena, hematochezia, nausea/vomiting interfering with oral intake, hematemesis, weakness she needs to go to the emergency room to which she expressed understanding.  Strict return cautions given.  Work excuse note provided.  Final Clinical Impressions(s) / UC Diagnoses   Final diagnoses:  Generalized abdominal pain  Diarrhea, unspecified type     Discharge Instructions      I am concerned that you might have an infection causing your symptoms.  When you can, please bring back the stool studies.  I will contact you through MyChart with your results but contact you if there is anything concerning.  I do want you to follow-up with a GI specialist.  Please call to schedule appointment as soon as possible.  As we discussed, we do not have the imaging ability in urgent care so if you develop any worsening symptoms including fever, severe abdominal pain, nausea/vomiting, blood in your stool, weakness, chest pain, shortness of breath you need to go to the emergency room immediately.     ED Prescriptions   None    PDMP not reviewed this encounter.   Terrilee Croak, PA-C 06/05/21 1209

## 2021-06-06 ENCOUNTER — Ambulatory Visit
Admission: RE | Admit: 2021-06-06 | Discharge: 2021-06-06 | Disposition: A | Discharge status: Home / Self Care | Payer: Federal, State, Local not specified - PPO | Source: Ambulatory Visit | Attending: Nephrology | Admitting: Nephrology

## 2021-06-06 DIAGNOSIS — N1831 Chronic kidney disease, stage 3a: Secondary | ICD-10-CM

## 2021-06-06 DIAGNOSIS — N281 Cyst of kidney, acquired: Secondary | ICD-10-CM | POA: Diagnosis not present

## 2021-06-06 DIAGNOSIS — N189 Chronic kidney disease, unspecified: Secondary | ICD-10-CM | POA: Diagnosis not present

## 2021-06-12 DIAGNOSIS — I10 Essential (primary) hypertension: Secondary | ICD-10-CM | POA: Diagnosis not present

## 2021-06-12 DIAGNOSIS — E785 Hyperlipidemia, unspecified: Secondary | ICD-10-CM | POA: Diagnosis not present

## 2021-06-12 DIAGNOSIS — E1142 Type 2 diabetes mellitus with diabetic polyneuropathy: Secondary | ICD-10-CM | POA: Diagnosis not present

## 2021-06-12 DIAGNOSIS — E1122 Type 2 diabetes mellitus with diabetic chronic kidney disease: Secondary | ICD-10-CM | POA: Diagnosis not present

## 2021-06-17 ENCOUNTER — Ambulatory Visit (INDEPENDENT_AMBULATORY_CARE_PROVIDER_SITE_OTHER): Payer: Federal, State, Local not specified - PPO | Admitting: Ophthalmology

## 2021-06-17 ENCOUNTER — Encounter (INDEPENDENT_AMBULATORY_CARE_PROVIDER_SITE_OTHER): Payer: Self-pay | Admitting: Ophthalmology

## 2021-06-17 DIAGNOSIS — E113552 Type 2 diabetes mellitus with stable proliferative diabetic retinopathy, left eye: Secondary | ICD-10-CM | POA: Diagnosis not present

## 2021-06-17 DIAGNOSIS — E113511 Type 2 diabetes mellitus with proliferative diabetic retinopathy with macular edema, right eye: Secondary | ICD-10-CM

## 2021-06-17 MED ORDER — BEVACIZUMAB 2.5 MG/0.1ML IZ SOSY
2.5000 mg | PREFILLED_SYRINGE | INTRAVITREAL | Status: AC | PRN
  Administered 2021-06-17: 2.5 mg via INTRAVITREAL

## 2021-06-17 NOTE — Progress Notes (Signed)
06/17/2021     CHIEF COMPLAINT Patient presents for  Chief Complaint  Patient presents with   Diabetic Retinopathy with Macular Edema      HISTORY OF PRESENT ILLNESS: Tina [name] is a 51 y.o. female who presents to the clinic today for:   HPI   5 weeks dilate OD, Avastin OD OCT. Patient states vision is stable and unchanged since last visit. Denies any new floaters or FOL.  LBS: 124 this morning per patient  Last edited by Laurin Coder on 06/17/2021  8:47 AM.      Referring physician: [name], DO [address] [place],  [place] [address]  HISTORICAL INFORMATION:   Selected notes from the MEDICAL RECORD NUMBER    Lab Results  Component Value Date   HGBA1C 8.4 (H) 08/13/2020     CURRENT MEDICATIONS: No current outpatient medications on file. (Ophthalmic Drugs)   No current facility-administered medications for this visit. (Ophthalmic Drugs)   Current Outpatient Medications (Other)  Medication Sig   albuterol (VENTOLIN HFA) 108 (90 Base) MCG/ACT inhaler Inhale 2 puffs into the lungs every 4 (four) hours as needed for wheezing or shortness of breath.   allopurinol (ZYLOPRIM) 100 MG tablet Take 100 mg by mouth at bedtime.   amLODipine (NORVASC) 5 MG tablet Take 1 tablet (5 mg total) by mouth daily.   aspirin EC 81 MG tablet Take 81 mg by mouth at bedtime. Swallow whole.   atorvastatin (LIPITOR) 40 MG tablet Take 1 tablet (40 mg total) by mouth at bedtime.   carvedilol (COREG) 25 MG tablet Take 25 mg by mouth 2 (two) times daily.   cetirizine (ZYRTEC) 10 MG tablet Take 10 mg by mouth daily as needed for allergies (itching).   cloNIDine (CATAPRES) 0.2 MG tablet Take 0.4 mg by mouth 2 (two) times daily.   clopidogrel (PLAVIX) 75 MG tablet Take 1 tablet (75 mg total) by mouth daily.   Continuous Blood Gluc Sensor (FREESTYLE LIBRE 2 SENSOR) MISC Inject 1 Device into the skin every 14 (fourteen) days.   DULoxetine (CYMBALTA) 20 MG capsule Take  20-40 mg by mouth See admin instructions. Take 20 mg by mouth in the morning and 40 mg at bedtime   EPINEPHrine (AUVI-Q) 0.3 mg/0.3 mL IJ SOAJ injection Inject 0.3 mg into the muscle as needed for anaphylaxis.   furosemide (LASIX) 40 MG tablet Take 40 mg by mouth in the morning.   gabapentin (NEURONTIN) 300 MG capsule Take 300 mg by mouth at bedtime.   hydrALAZINE (APRESOLINE) 10 MG tablet Take 1 tablet (10 mg total) by mouth 3 (three) times daily.   Insulin Aspart FlexPen (NOVOLOG) 100 UNIT/ML Inject 0-25 Units into the skin 3 (three) times daily before meals. Dose per sliding scale.   Insulin Glargine (BASAGLAR KWIKPEN) 100 UNIT/ML Inject 22 Units into the skin at bedtime. (Patient taking differently: Inject 18 Units into the skin at bedtime.)   isosorbide mononitrate (IMDUR) 60 MG 24 hr tablet TAKE 1 TABLET BY MOUTH DAILY   JARDIANCE 10 MG TABS tablet Take 5-10 mg by mouth daily.   OZEMPIC, 2 MG/DOSE, 8 MG/3ML SOPN Inject 2 mg into the skin once a week.   Semaglutide, 2 MG/DOSE, (OZEMPIC, 2 MG/DOSE,) 8 MG/3ML SOPN 2 mg subcutaneous once weekly 28 days   tiZANidine (ZANAFLEX) 2 MG tablet Take 1 tablet (2 mg total) by mouth at bedtime.   traMADol (ULTRAM) 50 MG tablet Take 1 tablet (50 mg total) by mouth daily.  Vitamin D, Cholecalciferol, 25 MCG (1000 UT) CAPS 1 capsule   No current facility-administered medications for this visit. (Other)      REVIEW OF SYSTEMS: ROS   Negative for: Constitutional, Gastrointestinal, Neurological, Skin, Genitourinary, Musculoskeletal, HENT, Endocrine, Cardiovascular, Eyes, Respiratory, Psychiatric, Allergic/Imm, Heme/Lymph Last edited by Hurman Horn, MD on 06/17/2021  9:38 AM.       ALLERGIES Allergies  Allergen Reactions   Amlodipine Benzoate Swelling and Other (See Comments)    Leg swelling Other reaction(s): leg swelling Other reaction(s): ? leg swelling at 10 mg dose Other reaction(s): ? leg swelling at 10 mg dose   Contrast Media  [Iodinated Contrast Media] Shortness Of Breath and Other (See Comments)    Difficulty breathing   Iodine Anaphylaxis    Other reaction(s): Unknown Other reaction(s): Unknown Other reaction(s): Unknown   Iohexol Hives, Nausea And Vomiting, Swelling and Other (See Comments)     Desc: Magnevist-gadolinium-difficulty breathing, throat swelling    Midazolam Hcl Anaphylaxis    Difficulty breathing   Other Shortness Of Breath, Itching and Other (See Comments)    Patient is allergic to all nuts except peanuts, which are NOT "nuts"- they are legumes   Shellfish Allergy Anaphylaxis   Shellfish-Derived Products Anaphylaxis    Other reaction(s): Unknown   Valsartan Swelling    Other reaction(s): Unknown Other reaction(s): Unknown Other reaction(s): angioedema   Metformin And Related Diarrhea, Nausea And Vomiting and Other (See Comments)    Dehydration, also   [place] [Dapagliflozin] Nausea Only and Other (See Comments)    UTI, headaches and muscle aches, also   Hydralazine Other (See Comments)    Reaction not recalled Other reaction(s): Unknown Other reaction(s): Unknown Other reaction(s): Unknown   Saxagliptin Hives, Nausea And Vomiting and Other (See Comments)    ONGLYZA- dehydration, also Other reaction(s): Unknown Other reaction(s): Unknown Other reaction(s): Unknown   Spironolactone Other (See Comments)    Elevated potassium- Severe hyperkalemia Other reaction(s): severe hyperkalemia Other reaction(s): severe hyperkalemia Other reaction(s): severe hyperkalemia   Avandia [Rosiglitazone Maleate] Hives and Other (See Comments)   Geodon [Ziprasidone] Other (See Comments)    Reaction not recalled   Kiwi Extract Itching and Swelling   Latex Itching    Other reaction(s): Unknown    PAST MEDICAL HISTORY Past Medical History:  Diagnosis Date   Allergy    Anemia    DURING MENSES--HAS HEAVY BLEEDING WITH PERODS   Angioedema 08/13/2020   Anxiety    Back pain, chronic    "ongoing"    Blood transfusion    IN 2012  AFTER C -SECTION   Cerebral thrombosis with cerebral infarction ([place]) 06/2009   RIGHT SIDED WEAKNESS ( ARM AND LEG ) AND SPASMS-remains with slight weakness and vertigo.   Constipation    Depression    Diabetes mellitus    Diabetic neuropathy (HCC)    BOTH FEET --COMES AND GOES   Edema, lower extremity    Endometriosis    Fatty liver    GERD (gastroesophageal reflux disease)    with pregnancy   H/O eye surgery    Headache(784.0)    MIGRAINES--NOT REALLY HEADACHE-MORE LIKE PRESSURE SENSATION IN HEAD-FEELS DIZZIY AND  FAINT AS THE PRESSURE RESOLVES   Hernia, incisional, RLQ, s/p lap repair Sep 2013 09/02/2011   History of vertigo 03/22/2018   Hx of migraines 10/19/2011   Hyperlipidemia    Hypertension    Leg pain, right    "like bad Charley horse"   Multiple food allergies  Panic disorder without agoraphobia    Rash    HANDS, ARMS --STATES HX OF RASH EVER SINCE CHILDBIRTH/PREGNANCY.  STATES THE RASH OFTEN OCCURS WHEN SHE IS REALLY STRESSED."goes and comes-presently left ring finger"   Restless leg syndrome    DIAGNOSED BY SLEEP STUDY - PT TOLD SHE DID NOT HAVE SLEEP APNEA   Right rotator cuff tear    PAIN IN RIGHT SHOULDER   SBO (small bowel obstruction) ([place]) 06/03/2012   Shortness of breath    Spastic hemiplegia affecting dominant side (HCC)    Stomach problems    Stroke (HCC)    Ventral hernia    RIGHT LOWER QUADRANT-CAUSING SOME PAIN   Weakness of right side of body    Past Surgical History:  Procedure Laterality Date   APPLICATION OF WOUND VAC N/A 05/25/2015   Procedure: APPLICATION OF WOUND VAC;  Surgeon: Michael Boston, MD;  Location: [facility];  Service: General;  Laterality: N/A;   CARDIAC CATHETERIZATION     CESAREAN SECTION  2012   COLONOSCOPY     CORONARY STENT INTERVENTION N/A 09/05/2020   Procedure: CORONARY STENT INTERVENTION;  Surgeon: Nigel Mormon, MD;  Location: [facility];  Service: Cardiovascular;   Laterality: N/A;   DIAGNOSTIC LAPAROSCOPY     ESOPHAGOGASTRODUODENOSCOPY N/A 06/03/2012   Procedure: ESOPHAGOGASTRODUODENOSCOPY (EGD);  Surgeon: [name] Craver, MD;  Location: [facility];  Service: Endoscopy;  Laterality: N/A;   EXCISION MASS ABDOMINAL N/A 05/25/2015   Procedure: ABDOMINAL WALL EXPLORATION EXCISION OF SEROMA REMOVAL OF REDUNDANT SKIN ;  Surgeon: Michael Boston, MD;  Location: [facility];  Service: General;  Laterality: N/A;   EYE SURGERY     Eye laser for vessel hemorrhaging   fybroid removal     HERNIA REPAIR  10/03/2011   ventral hernia repair   INSERTION OF MESH N/A 02/04/2013   Procedure: INSERTION OF MESH;  Surgeon: Adin Hector, MD;  Location: [facility];  Service: General;  Laterality: N/A;   INTRAVASCULAR ULTRASOUND/IVUS N/A 09/05/2020   Procedure: Intravascular Ultrasound/IVUS;  Surgeon: Nigel Mormon, MD;  Location: [facility];  Service: Cardiovascular;  Laterality: N/A;   LEFT HEART CATH AND CORONARY ANGIOGRAPHY N/A 09/05/2020   Procedure: LEFT HEART CATH AND CORONARY ANGIOGRAPHY;  Surgeon: Nigel Mormon, MD;  Location: [facility];  Service: Cardiovascular;  Laterality: N/A;   UMBILICAL HERNIA REPAIR N/A 02/04/2013   Procedure: LAPAROSCOPIC ventral wall hernia repair LAPAROSCOPIC LYSIS OF ADHESIONS laparoscopic exploration of abdomen ;  Surgeon: Adin Hector, MD;  Location: [facility];  Service: General;  Laterality: N/A;   UPPER GASTROINTESTINAL ENDOSCOPY     URETER REVISION     Bilateral "twisted"   UTERINE FIBROID SURGERY     2 SURGERIES FOR FIBROIDS   VENTRAL HERNIA REPAIR  10/03/2011   Procedure: LAPAROSCOPIC VENTRAL HERNIA;  Surgeon: Adin Hector, MD;  Location: [facility];  Service: General;  Laterality: N/A;    FAMILY HISTORY Family History  Problem Relation Age of Onset   Diabetes Father    Kidney disease Father    Depression Father    Drug abuse Father    Allergic rhinitis Mother    Eczema Mother    Urticaria Mother     Depression Mother    Anxiety disorder Mother    Bipolar disorder Mother    Alcoholism Mother    Drug abuse Mother    Eating disorder Mother    Diabetes Maternal Grandmother    Hyperlipidemia Paternal Grandmother  Stroke Paternal Grandmother    Eczema Sister    Urticaria Sister    Colon cancer Paternal Uncle    Other Neg Hx    Angioedema Neg Hx    Asthma Neg Hx    Colon polyps Neg Hx    Esophageal cancer Neg Hx    Rectal cancer Neg Hx    Stomach cancer Neg Hx     SOCIAL HISTORY Social History   Tobacco Use   Smoking status: Never   Smokeless tobacco: Never  Vaping Use   Vaping Use: Never used  Substance Use Topics   Alcohol use: No    Alcohol/week: 0.0 standard drinks   Drug use: No         OPHTHALMIC EXAM:  Base Eye Exam     Visual Acuity (ETDRS)       Right Left   Dist [place] 20/20 -2 20/20 -1         Tonometry (Tonopen, 8:52 AM)       Right Left   Pressure 18 16         Pupils       Pupils Dark Light APD   Right PERRL 4 3 None   Left PERRL 4 3 None         Extraocular Movement       Right Left    Full Full         Neuro/Psych     Oriented x3: Yes   Mood/Affect: Normal         Dilation     Right eye: 1.0% Mydriacyl, 2.5% Phenylephrine @ 8:50 AM           Slit Lamp and Fundus Exam     External Exam       Right Left   External Normal Normal         Slit Lamp Exam       Right Left   Lids/Lashes Normal Normal   Conjunctiva/Sclera White and quiet White and quiet   Cornea Clear Clear   Anterior Chamber Deep and quiet Deep and quiet   Iris Round and reactive Round and reactive   Lens Centered Posterior chamber intraocular lens, 2+ Posterior capsular opacification Posterior chamber intraocular lens   Anterior Vitreous Normal Normal         Fundus Exam       Right Left   Posterior Vitreous Vitrectomized    Disc Normal    C/D Ratio 0.2    Macula Microaneurysms, no exudates, Macular thickening, Moderate  clinically significant macular edema    Vessels PDR-quiet    Periphery Good PRP, a attached             IMAGING AND PROCEDURES  Imaging and Procedures for 06/17/21  OCT, Retina - OU - Both Eyes       Right Eye Quality was good. Scan locations included subfoveal. Central Foveal Thickness: 458. Progression has been stable. Findings include cystoid macular edema, abnormal foveal contour.   Left Eye Quality was good. Scan locations included subfoveal. Central Foveal Thickness: 351. Progression has been stable. Findings include normal foveal contour.   Notes Temporal thickening worsening post Avastin, now with center involved CSME OD, repeat Avastin OD today for minor improvement            Intravitreal Injection, Pharmacologic Agent - OD - Right Eye       Time Out 06/17/2021. 9:45 AM. Confirmed correct patient, procedure, site, and patient consented.   Anesthesia  Topical anesthesia was used. Anesthetic medications included Lidocaine 4%.   Procedure Preparation included 5% betadine to ocular surface, 10% betadine to eyelids. A 30 gauge needle was used.   Injection: 2.5 mg bevacizumab 2.5 MG/0.1ML   Route: Intravitreal, Site: Right Eye   NDC: [phone], Lot: [id number], Expiration date: 08/11/2021   Post-op Post injection exam found visual acuity of at least counting fingers. The patient tolerated the procedure well. There were no complications. The patient received written and verbal post procedure care education. Post injection medications included ocuflox.              ASSESSMENT/PLAN:  Diabetic macular edema of right eye with proliferative retinopathy associated with type 2 diabetes mellitus (HCC) CSME OD worsening, now postinjection #2 Avastin  Stable treated proliferative diabetic retinopathy of left eye determined by examination associated with type 2 diabetes mellitus ([place]) OS, no center involved CSME looks great     ICD-10-CM   1. Diabetic macular  edema of right eye with proliferative retinopathy associated with type 2 diabetes mellitus (HCC)  E11.3511 OCT, Retina - OU - Both Eyes    Intravitreal Injection, Pharmacologic Agent - OD - Right Eye    bevacizumab (AVASTIN) SOSY 2.5 mg    2. Stable treated proliferative diabetic retinopathy of left eye determined by examination associated with type 2 diabetes mellitus ([place])  C12.7517       1.  OD, center involved CSME worsening postinjection #2 Avastin.  We will repeat injection today at 6 weeks of Avastin and will likely need to change to [facility] OD next.  2.  Dilate OU next, likely injection Eylea OD  3.  Ophthalmic Meds Ordered this visit:  Meds ordered this encounter  Medications   bevacizumab (AVASTIN) SOSY 2.5 mg       Return in about 5 weeks (around 07/22/2021) for DILATE OU, COLOR FP, EYLEA OCT, OD.  There are no Patient Instructions on file for this visit.   Explained the diagnoses, plan, and follow up with the patient and they expressed understanding.  Patient expressed understanding of the importance of proper follow up care.   Clent Demark Pantelis Elgersma M.D. Diseases & Surgery of the Retina and Vitreous Retina & Diabetic [place] 06/17/21     Abbreviations: M myopia (nearsighted); A astigmatism; H hyperopia (farsighted); P presbyopia; Mrx spectacle prescription;  CTL contact lenses; OD right eye; OS left eye; OU both eyes  XT exotropia; ET esotropia; PEK punctate epithelial keratitis; PEE punctate epithelial erosions; DES dry eye syndrome; MGD meibomian gland dysfunction; ATs artificial tears; PFAT's preservative free artificial tears; [place] nuclear sclerotic cataract; PSC posterior subcapsular cataract; ERM epi-retinal membrane; PVD posterior vitreous detachment; RD retinal detachment; DM diabetes mellitus; DR diabetic retinopathy; NPDR non-proliferative diabetic retinopathy; PDR proliferative diabetic retinopathy; CSME clinically significant macular edema; DME diabetic macular  edema; dbh dot blot hemorrhages; CWS cotton wool spot; POAG primary open angle glaucoma; C/D cup-to-disc ratio; HVF humphrey visual field; GVF goldmann visual field; OCT optical coherence tomography; IOP intraocular pressure; BRVO Branch retinal vein occlusion; CRVO central retinal vein occlusion; CRAO central retinal artery occlusion; BRAO branch retinal artery occlusion; RT retinal tear; SB scleral buckle; PPV pars plana vitrectomy; VH Vitreous hemorrhage; PRP panretinal laser photocoagulation; IVK intravitreal kenalog; VMT vitreomacular traction; MH Macular hole;  NVD neovascularization of the disc; NVE neovascularization elsewhere; AREDS age related eye disease study; ARMD age related macular degeneration; POAG primary open angle glaucoma; EBMD epithelial/anterior basement membrane dystrophy; ACIOL anterior chamber intraocular lens; IOL intraocular lens;  PCIOL posterior chamber intraocular lens; Phaco/IOL phacoemulsification with intraocular lens placement; [place] photorefractive keratectomy; LASIK laser assisted in situ keratomileusis; HTN hypertension; DM diabetes mellitus; COPD chronic obstructive pulmonary disease

## 2021-06-17 NOTE — Assessment & Plan Note (Signed)
OS, no center involved CSME looks great

## 2021-06-17 NOTE — Assessment & Plan Note (Addendum)
CSME OD worsening, now postinjection #2 Avastin

## 2021-06-20 ENCOUNTER — Ambulatory Visit: Payer: Federal, State, Local not specified - PPO | Attending: Physical Medicine & Rehabilitation

## 2021-06-20 DIAGNOSIS — R2689 Other abnormalities of gait and mobility: Secondary | ICD-10-CM | POA: Insufficient documentation

## 2021-06-20 DIAGNOSIS — H832X9 Labyrinthine dysfunction, unspecified ear: Secondary | ICD-10-CM | POA: Diagnosis not present

## 2021-06-20 DIAGNOSIS — M6281 Muscle weakness (generalized): Secondary | ICD-10-CM | POA: Insufficient documentation

## 2021-06-20 DIAGNOSIS — R42 Dizziness and giddiness: Secondary | ICD-10-CM | POA: Insufficient documentation

## 2021-06-20 DIAGNOSIS — R2681 Unsteadiness on feet: Secondary | ICD-10-CM | POA: Insufficient documentation

## 2021-06-20 NOTE — Therapy (Signed)
OUTPATIENT PHYSICAL THERAPY VESTIBULAR EVALUATION     Patient Name: Tina [name] MRN: [id number] DOB:27-May-1970, 51 y.o., female Today's Date: 06/20/2021  PCP: [name], DO REFERRING PROVIDER: [name], MD   PT End of Session - 06/20/21 1524     Visit Number 1    Number of Visits 13    Date for PT Re-Evaluation 08/02/21    Authorization Type BCBS (VL: 4)    PT Start Time 1525    PT Stop Time 1611    PT Time Calculation (min) 46 min    Activity Tolerance Patient tolerated treatment well    Behavior During Therapy WFL for tasks assessed/performed             Past Medical History:  Diagnosis Date   Allergy    Anemia    DURING MENSES--HAS HEAVY BLEEDING WITH PERODS   Angioedema 08/13/2020   Anxiety    Back pain, chronic    "ongoing"   Blood transfusion    IN 2012  AFTER C -SECTION   Cerebral thrombosis with cerebral infarction ([place]) 06/2009   RIGHT SIDED WEAKNESS ( ARM AND LEG ) AND SPASMS-remains with slight weakness and vertigo.   Constipation    Depression    Diabetes mellitus    Diabetic neuropathy (HCC)    BOTH FEET --COMES AND GOES   Edema, lower extremity    Endometriosis    Fatty liver    GERD (gastroesophageal reflux disease)    with pregnancy   H/O eye surgery    Headache(784.0)    MIGRAINES--NOT REALLY HEADACHE-MORE LIKE PRESSURE SENSATION IN HEAD-FEELS DIZZIY AND  FAINT AS THE PRESSURE RESOLVES   Hernia, incisional, RLQ, s/p lap repair Sep 2013 09/02/2011   History of vertigo 03/22/2018   Hx of migraines 10/19/2011   Hyperlipidemia    Hypertension    Leg pain, right    "like bad Charley horse"   Multiple food allergies    Panic disorder without agoraphobia    Rash    HANDS, ARMS --STATES HX OF RASH [place].  STATES THE RASH OFTEN OCCURS WHEN SHE IS REALLY STRESSED."goes and comes-presently left ring finger"   Restless leg syndrome    DIAGNOSED BY SLEEP STUDY - PT TOLD SHE DID NOT HAVE SLEEP APNEA    Right rotator cuff tear    PAIN IN RIGHT SHOULDER   SBO (small bowel obstruction) ([place]) 06/03/2012   Shortness of breath    Spastic hemiplegia affecting dominant side (HCC)    Stomach problems    Stroke (HCC)    Ventral hernia    RIGHT LOWER QUADRANT-CAUSING SOME PAIN   Weakness of right side of body    Past Surgical History:  Procedure Laterality Date   APPLICATION OF WOUND VAC N/A 05/25/2015   Procedure: APPLICATION OF WOUND VAC;  Surgeon: Michael Boston, MD;  Location: [facility];  Service: General;  Laterality: N/A;   CARDIAC CATHETERIZATION     CESAREAN SECTION  2012   COLONOSCOPY     CORONARY STENT INTERVENTION N/A 09/05/2020   Procedure: CORONARY STENT INTERVENTION;  Surgeon: Nigel Mormon, MD;  Location: [facility];  Service: Cardiovascular;  Laterality: N/A;   DIAGNOSTIC LAPAROSCOPY     ESOPHAGOGASTRODUODENOSCOPY N/A 06/03/2012   Procedure: ESOPHAGOGASTRODUODENOSCOPY (EGD);  Surgeon: [name] Craver, MD;  Location: [facility];  Service: Endoscopy;  Laterality: N/A;   EXCISION MASS ABDOMINAL N/A 05/25/2015   Procedure: ABDOMINAL WALL EXPLORATION EXCISION OF SEROMA REMOVAL OF REDUNDANT SKIN ;  Surgeon: Michael Boston, MD;  Location: [facility];  Service: General;  Laterality: N/A;   EYE SURGERY     Eye laser for vessel hemorrhaging   fybroid removal     HERNIA REPAIR  10/03/2011   ventral hernia repair   INSERTION OF MESH N/A 02/04/2013   Procedure: INSERTION OF MESH;  Surgeon: Adin Hector, MD;  Location: [facility];  Service: General;  Laterality: N/A;   INTRAVASCULAR ULTRASOUND/IVUS N/A 09/05/2020   Procedure: Intravascular Ultrasound/IVUS;  Surgeon: Nigel Mormon, MD;  Location: [place] [facility];  Service: Cardiovascular;  Laterality: N/A;   LEFT HEART CATH AND CORONARY ANGIOGRAPHY N/A 09/05/2020   Procedure: LEFT HEART CATH AND CORONARY ANGIOGRAPHY;  Surgeon: Nigel Mormon, MD;  Location: [place];  Service: Cardiovascular;  Laterality: N/A;    UMBILICAL HERNIA REPAIR N/A 02/04/2013   Procedure: LAPAROSCOPIC ventral wall hernia repair LAPAROSCOPIC LYSIS OF ADHESIONS laparoscopic exploration of abdomen ;  Surgeon: Adin Hector, MD;  Location: [facility];  Service: General;  Laterality: N/A;   UPPER GASTROINTESTINAL ENDOSCOPY     URETER REVISION     Bilateral "twisted"   UTERINE FIBROID SURGERY     2 SURGERIES FOR FIBROIDS   VENTRAL HERNIA REPAIR  10/03/2011   Procedure: LAPAROSCOPIC VENTRAL HERNIA;  Surgeon: Adin Hector, MD;  Location: [facility];  Service: General;  Laterality: N/A;   Patient Active Problem List   Diagnosis Date Noted   Hypertensive emergency 09/09/2020   Chest pain    Elevated troponin    Status post coronary artery stent placement    Non-ST elevation (NSTEMI) myocardial infarction ([place]) 09/05/2020   NSTEMI (non-ST elevated myocardial infarction) ([place]) 09/05/2020   Hypersomnia, persistent 09/04/2020   OSA on CPAP 09/04/2020   Shortness of breath 08/18/2020   Allergy to alpha-gal 08/18/2020   Seasonal and perennial allergic rhinitis 08/18/2020   Allergy with anaphylaxis due to food 08/18/2020   Pruritus 08/18/2020   Pollen-food allergy 08/18/2020   Allergic reaction 08/13/2020   Angioedema 08/13/2020   Type 2 macular telangiectasis, bilateral 01/03/2020   Excessive daytime sleepiness 09/12/2019   Sleep paralysis 09/12/2019   Hypnopompic hallucination 09/12/2019   Abnormal dreams 09/12/2019   Diabetic macular edema of right eye with proliferative retinopathy associated with type 2 diabetes mellitus ([place]) 07/04/2019   Stable treated proliferative diabetic retinopathy of left eye determined by examination associated with type 2 diabetes mellitus ([place]) 07/04/2019   Long-term insulin use ([place]) 04/07/2019   Depression 04/13/2018   History of vertigo 03/22/2018   Laboratory examination 03/22/2018   Chronic diastolic (congestive) heart failure ([place]) 03/22/2018   Other hyperlipidemia 02/15/2018   Vitamin D  deficiency 12/31/2017   Class 2 severe obesity with serious comorbidity and body mass index (BMI) of 39.0 to 39.9 in adult ([place]) 11/18/2017   Gait disturbance, post-stroke 04/23/2017   Strain of gluteus medius 04/23/2017   OSA (obstructive sleep apnea) 10/17/2015   Periodic limb movement disorder (PLMD) 10/17/2015   Essential hypertension 05/26/2015   Diabetes mellitus ([place]) 05/26/2015   Abdominal wall mass of right lower quadrant 05/25/2015   Hemiparesis affecting right side as late effect of stroke ([place]) 11/09/2014   Abdominal wall seroma    Sepsis due to undetermined organism ([place]) 03/06/2014   Nausea vomiting and diarrhea 03/06/2014   Sepsis ([place]) 03/06/2014   Tension headache 09/01/2013   Nausea with vomiting ?Gastroparesis? 03/21/2013   Spastic hemiplegia affecting dominant side ([place]) 02/16/2013   Recurrent ventral incisional hernia  s/p closure/repair w mesh 02/04/2013 01/26/2013   Constipation, chronic 01/26/2013   Abdominal pain, chronic, right lower quadrant 06/03/2012   History of CVA (cerebrovascular accident) 06/03/2012   HTN (hypertension) 06/03/2012   Diabetes mellitus type II, uncontrolled ([place]) 10/16/2011   Obesity (BMI 30-39.9) 09/02/2011   Rotator cuff tear 08/25/2011   Sciatica 06/10/2011   Paresthesias in right hand 06/10/2011   Lumbago 05/09/2011   Paresthesias 05/09/2011    ONSET DATE: 05/30/21  REFERRING DIAG: H82.9H3 (ICD-10-CM) - Vestibular dysfunction, unspecified laterality  THERAPY DIAG:  Dizziness and giddiness  Muscle weakness (generalized)  Unsteadiness on feet  Other abnormalities of gait and mobility  Rationale for Evaluation and Treatment Rehabilitation  SUBJECTIVE:   SUBJECTIVE STATEMENT: Dizziness is chronic, but feels like it has been exacerbated in the last few months. Patient reports that she has instances of where she is falling off balance. Reports some lightheadedness with standing up after being seated for an extended period of  time and bending downing and standing back. Patient notices dizziness with quick head or body movements. Reports some lightheadedness, and some spinning sensation. Reports spinning occurs mostly in the morning. Spinning is very brief. Some nausea with the spinning. Denies vision and hearing changes. Does receives injections in the R eye due to fluid/swelling, be followed regarding. Patient has had a few mild headaches, but no recent migraines. Has fall multiple times over the last few months. Reports using a cart in the grocery store due to dizziness and imbalance.  Pt accompanied by: self  PERTINENT HISTORY: Anxiety, Angioedema, Chronic Back Pain, CVA with R Sided Weakness, Depression, DM with Neuropathy, GERD, HA, Hx of Vertigo, Migraines, HLD, HTN, Panic Disorder, Restless Leg, R Rotator Cuff Tear, SOB. Old infarctions affecting the pons, left basal  ganglia and bilateral hemispheric white matter.   PAIN:  Are you having pain? No  PRECAUTIONS: Fall  WEIGHT BEARING RESTRICTIONS No  FALLS: Has patient fallen in last 6 months? Yes. Number of falls 7  LIVING ENVIRONMENT: Lives with: lives with their family and lives with their spouse Lives in: House/apartment Stairs: Yes: Internal: 14-15 steps; can reach both Has following equipment at home: Single point cane  PLOF: Independent with household mobility without device  PATIENT GOALS Improve the Dizziness; Improve the Balance. Be able to walk more without looking drunk  OBJECTIVE:   COGNITION: Overall cognitive status: Within functional limits for tasks assessed   SENSATION: Hx of diabetic neuropathy; as well as some reduced sensation on RLE residual due to CVA  POSTURE: No Significant postural limitations   LOWER EXTREMITY MMT:   MMT Right eval Left eval  Hip flexion 4/5 5/5  Hip abduction 4-/5 4+/5  Hip adduction    Hip internal rotation    Hip external rotation    Knee flexion 4+/5 5/5  Knee extension 4-/5 5/5  Ankle  dorsiflexion 4/5 4+/5  Ankle plantarflexion    Ankle inversion    Ankle eversion    (Blank rows = not tested)   TRANSFERS: Assistive device utilized: None  Sit to stand: Modified independence Stand to sit: Modified independence  GAIT: Gait pattern: WFL Distance walked: clinic distance with activities Assistive device utilized: None Level of assistance: SBA Comments: unsteadiness noted with ambulation; CGA required after DVA due to veering.   PATIENT SURVEYS:  DPS: and DFS:    VESTIBULAR ASSESSMENT     SYMPTOM BEHAVIOR:   Subjective history: See Subjective   Non-Vestibular symptoms: headaches and nausea/vomiting   Type of dizziness:  Imbalance (Disequilibrium), Spinning/Vertigo, Unsteady with head/body turns, and Lightheadedness/Faint   Frequency: daily   Duration: seconds to minutes   Aggravating factors: Induced by position change: supine to sit and sit to stand and Induced by motion: occur when walking, bending down to the ground, turning body quickly, and turning head quickly   Relieving factors: slow movements   Progression of symptoms: worse   OCULOMOTOR EXAM:   Ocular Alignment: normal   Ocular ROM: No Limitations   Spontaneous Nystagmus: absent   Gaze-Induced Nystagmus:  no gaze induced nystagmus, but crossing of eyes noted with return to midline from inferior L lateral position   Smooth Pursuits:  saccades noted intermittent   Saccades: hypometric/undershoots      VESTIBULAR - OCULAR REFLEX:    Slow VOR: Normal; mod dizziness   VOR Cancellation: Comment: Normal; mild dizziness    Head-Impulse Test: deferred due to symptoms   Dynamic Visual Acuity: Static: 9 Dynamic: 5; moderate-severe dizziness    MOTION SENSITIVITY:    Motion Sensitivity Quotient  Intensity: 0 = none, 1 = Lightheaded, 2 = Mild, 3 = Moderate, 4 = Severe, 5 = Vomiting  Intensity  1. Sitting to supine 1  2. Supine to L side 0  3. Supine to R side 0  4. Supine to sitting 1  5. L  Hallpike-Dix   6. Up from L    7. R Hallpike-Dix   8. Up from R    9. Sitting, head  tipped to L knee 0 (headache increased)  10. Head up from L  knee 3  11. Sitting, head  tipped to R knee 0  12. Head up from R  knee 3  13. Sitting head turns x5 3-4 (nausea, reports spinning)  14.Sitting head nods x5 4  15. In stance, 180  turn to L    16. In stance, 180  turn to R     OTHOSTATICS: will benefit from further orthostatic assessment  FUNCTIONAL GAIT:  further balance test to be assessed  PATIENT EDUCATION: Education details: Educated on [employer] Person educated: Patient Education method: Explanation Education comprehension: verbalized understanding   GOALS: Goals reviewed with patient? Yes  SHORT TERM GOALS: Target date: 07/12/2021  Pt will be independent with initial HEP for improved balance and vestibular input  Baseline: no HEP Established Goal status: INITIAL  2.  FGA TBA and LTG to be set as applicable Baseline: TBA Goal status: INITIAL  3.  Pt will report </= 3/5 for all movements on MSQ to indicate improvement in motion sensitivity and improved activity tolerance.   Baseline: 3-4/5 Goal status: INITIAL   LONG TERM GOALS: Target date: 08/02/2021  Pt will be independent with final HEP for improved balance and vestibular  Baseline: no HEP established Goal status: INITIAL  2.  Pt will improve FGA by 4 points from baseline to demonstrate improved balance and reduced fall risk Baseline: TBA Goal status: INITIAL  3.  Pt will report </= 2/5 for all movements on MSQ to indicate improvement in motion sensitivity and improved activity tolerance.  Baseline: 3-4/5 Goal status: INITIAL  4.  Pt will improve DVA to </= 2 line difference to indicate improved VOR Baseline: 4 line difference Goal status: INITIAL  5.  LTG to be set for FOTO Baseline: TBA Goal status: INITIAL  ASSESSMENT:  CLINICAL IMPRESSION: Patient is a 51 y.o. female referred to  Neuro OPPT services for Dizziness/Vestibular Dysfunction. Patient's PMH significant for the following: Anxiety, Angioedema, Chronic Back Pain,  CVA with R Sided Weakness, Depression, DM with Neuropathy, GERD, HA, Hx of Vertigo, Migraines, HLD, HTN, Panic Disorder, Restless Leg, R Rotator Cuff Tear, SOB . Upon evaluation, patient presents with the following impairments: increased dizziness, decreased strength (RLE > LLE), abnormal oculomotor exam, impaired balance, impaired VOR, and increased fall risk. Patient will benefit from skilled PT services to address impairments.     OBJECTIVE IMPAIRMENTS decreased activity tolerance, decreased balance, decreased endurance, decreased knowledge of use of DME, difficulty walking, decreased strength, dizziness, impaired sensation, and pain.   ACTIVITY LIMITATIONS bending, reach over head, and caring for others  PARTICIPATION LIMITATIONS: cleaning, driving, and community activity  PERSONAL FACTORS Age, Time since onset of injury/illness/exacerbation, and 3+ comorbidities: Anxiety, Angioedema, Chronic Back Pain, CVA with R Sided Weakness, Depression, DM with Neuropathy, GERD, HA, Hx of Vertigo, Migraines, HLD, HTN, Panic Disorder, Restless Leg, R Rotator Cuff Tear, SOB  are also affecting patient's functional outcome.   REHAB POTENTIAL: Good  CLINICAL DECISION MAKING: Evolving/moderate complexity  EVALUATION COMPLEXITY: Moderate   PLAN: PT FREQUENCY: 2x/week  PT DURATION: 6 weeks  PLANNED INTERVENTIONS: Therapeutic exercises, Therapeutic activity, Neuromuscular re-education, Balance training, Gait training, Patient/Family education, Joint manipulation, Joint mobilization, Stair training, Vestibular training, Canalith repositioning, DME instructions, Aquatic Therapy, Dry Needling, Cryotherapy, Moist heat, and Manual therapy  PLAN FOR NEXT SESSION: Assess Orthostatics. Assess FGA and update LTG. Initiate HEP focused on gaze stabilization and  balance   Jones Bales, PT, DPT 06/20/2021, 5:08 PM

## 2021-06-26 ENCOUNTER — Encounter: Payer: Self-pay | Admitting: Physical Therapy

## 2021-06-26 ENCOUNTER — Ambulatory Visit: Payer: Federal, State, Local not specified - PPO | Admitting: Physical Therapy

## 2021-06-26 DIAGNOSIS — R2681 Unsteadiness on feet: Secondary | ICD-10-CM

## 2021-06-26 DIAGNOSIS — R42 Dizziness and giddiness: Secondary | ICD-10-CM | POA: Diagnosis not present

## 2021-06-26 DIAGNOSIS — M6281 Muscle weakness (generalized): Secondary | ICD-10-CM

## 2021-06-26 DIAGNOSIS — R2689 Other abnormalities of gait and mobility: Secondary | ICD-10-CM

## 2021-06-26 DIAGNOSIS — H832X9 Labyrinthine dysfunction, unspecified ear: Secondary | ICD-10-CM | POA: Diagnosis not present

## 2021-06-26 NOTE — Therapy (Signed)
OUTPATIENT PHYSICAL THERAPY VESTIBULAR TREATMENT     Patient Name: Tina [name] MRN: [id number] DOB:December 05, 1970, 51 y.o., female Today's Date: 06/26/2021  PCP: [name], DO REFERRING PROVIDER: [name], MD   PT End of Session - 06/26/21 1238     Visit Number 2    Number of Visits 13    Date for PT Re-Evaluation 08/02/21    Authorization Type [employer] (VL: 50)    PT Start Time 1236   pt late to session   PT Stop Time 1315    PT Time Calculation (min) 39 min    Activity Tolerance Patient tolerated treatment well    Behavior During Therapy WFL for tasks assessed/performed              Past Medical History:  Diagnosis Date   Allergy    Anemia    DURING MENSES--HAS HEAVY BLEEDING WITH PERODS   Angioedema 08/13/2020   Anxiety    Back pain, chronic    "ongoing"   Blood transfusion    IN 2012  AFTER C -SECTION   Cerebral thrombosis with cerebral infarction ([place]) 06/2009   RIGHT SIDED WEAKNESS ( ARM AND LEG ) AND SPASMS-remains with slight weakness and vertigo.   Constipation    Depression    Diabetes mellitus    Diabetic neuropathy (HCC)    BOTH FEET --COMES AND GOES   Edema, lower extremity    Endometriosis    Fatty liver    GERD (gastroesophageal reflux disease)    with pregnancy   H/O eye surgery    Headache(784.0)    MIGRAINES--NOT REALLY HEADACHE-MORE LIKE PRESSURE SENSATION IN HEAD-FEELS DIZZIY AND  FAINT AS THE PRESSURE RESOLVES   Hernia, incisional, RLQ, s/p lap repair Sep 2013 09/02/2011   History of vertigo 03/22/2018   Hx of migraines 10/19/2011   Hyperlipidemia    Hypertension    Leg pain, right    "like bad Charley horse"   Multiple food allergies    Panic disorder without agoraphobia    Rash    HANDS, ARMS --STATES HX OF RASH [place].  STATES THE RASH OFTEN OCCURS WHEN SHE IS REALLY STRESSED."goes and comes-presently left ring finger"   Restless leg syndrome    DIAGNOSED BY SLEEP STUDY - PT TOLD SHE DID  NOT HAVE SLEEP APNEA   Right rotator cuff tear    PAIN IN RIGHT SHOULDER   SBO (small bowel obstruction) ([place]) 06/03/2012   Shortness of breath    Spastic hemiplegia affecting dominant side (HCC)    Stomach problems    Stroke (HCC)    Ventral hernia    RIGHT LOWER QUADRANT-CAUSING SOME PAIN   Weakness of right side of body    Past Surgical History:  Procedure Laterality Date   APPLICATION OF WOUND VAC N/A 05/25/2015   Procedure: APPLICATION OF WOUND VAC;  Surgeon: Michael Boston, MD;  Location: [facility];  Service: General;  Laterality: N/A;   CARDIAC CATHETERIZATION     CESAREAN SECTION  2012   COLONOSCOPY     CORONARY STENT INTERVENTION N/A 09/05/2020   Procedure: CORONARY STENT INTERVENTION;  Surgeon: Nigel Mormon, MD;  Location: [place] [facility];  Service: Cardiovascular;  Laterality: N/A;   DIAGNOSTIC LAPAROSCOPY     ESOPHAGOGASTRODUODENOSCOPY N/A 06/03/2012   Procedure: ESOPHAGOGASTRODUODENOSCOPY (EGD);  Surgeon: [name] Craver, MD;  Location[facility];  Service: Endoscopy;  Laterality: N/A;   EXCISION MASS ABDOMINAL N/A 05/25/2015   Procedure: ABDOMINAL WALL EXPLORATION EXCISION OF  SEROMA REMOVAL OF REDUNDANT SKIN ;  Surgeon: Michael Boston, MD;  Location: [facility];  Service: General;  Laterality: N/A;   EYE SURGERY     Eye laser for vessel hemorrhaging   fybroid removal     HERNIA REPAIR  10/03/2011   ventral hernia repair   INSERTION OF MESH N/A 02/04/2013   Procedure: INSERTION OF MESH;  Surgeon: Adin Hector, MD;  Location: [facility];  Service: General;  Laterality: N/A;   INTRAVASCULAR ULTRASOUND/IVUS N/A 09/05/2020   Procedure: Intravascular Ultrasound/IVUS;  Surgeon: Nigel Mormon, MD;  Location: [place] [facility];  Service: Cardiovascular;  Laterality: N/A;   LEFT HEART CATH AND CORONARY ANGIOGRAPHY N/A 09/05/2020   Procedure: LEFT HEART CATH AND CORONARY ANGIOGRAPHY;  Surgeon: Nigel Mormon, MD;  Location: [place] [facility];  Service:  Cardiovascular;  Laterality: N/A;   UMBILICAL HERNIA REPAIR N/A 02/04/2013   Procedure: LAPAROSCOPIC ventral wall hernia repair LAPAROSCOPIC LYSIS OF ADHESIONS laparoscopic exploration of abdomen ;  Surgeon: Adin Hector, MD;  Location: [facility];  Service: General;  Laterality: N/A;   UPPER GASTROINTESTINAL ENDOSCOPY     URETER REVISION     Bilateral "twisted"   UTERINE FIBROID SURGERY     2 SURGERIES FOR FIBROIDS   VENTRAL HERNIA REPAIR  10/03/2011   Procedure: LAPAROSCOPIC VENTRAL HERNIA;  Surgeon: Adin Hector, MD;  Location: [facility];  Service: General;  Laterality: N/A;   Patient Active Problem List   Diagnosis Date Noted   Hypertensive emergency 09/09/2020   Chest pain    Elevated troponin    Status post coronary artery stent placement    Non-ST elevation (NSTEMI) myocardial infarction ([place]) 09/05/2020   NSTEMI (non-ST elevated myocardial infarction) ([place]) 09/05/2020   Hypersomnia, persistent 09/04/2020   OSA on CPAP 09/04/2020   Shortness of breath 08/18/2020   Allergy to alpha-gal 08/18/2020   Seasonal and perennial allergic rhinitis 08/18/2020   Allergy with anaphylaxis due to food 08/18/2020   Pruritus 08/18/2020   Pollen-food allergy 08/18/2020   Allergic reaction 08/13/2020   Angioedema 08/13/2020   Type 2 macular telangiectasis, bilateral 01/03/2020   Excessive daytime sleepiness 09/12/2019   Sleep paralysis 09/12/2019   Hypnopompic hallucination 09/12/2019   Abnormal dreams 09/12/2019   Diabetic macular edema of right eye with proliferative retinopathy associated with type 2 diabetes mellitus ([place]) 07/04/2019   Stable treated proliferative diabetic retinopathy of left eye determined by examination associated with type 2 diabetes mellitus ([place]) 07/04/2019   Long-term insulin use ([place]) 04/07/2019   Depression 04/13/2018   History of vertigo 03/22/2018   Laboratory examination 03/22/2018   Chronic diastolic (congestive) heart failure ([place]) 03/22/2018   Other  hyperlipidemia 02/15/2018   Vitamin D deficiency 12/31/2017   Class 2 severe obesity with serious comorbidity and body mass index (BMI) of 39.0 to 39.9 in adult ([place]) 11/18/2017   Gait disturbance, post-stroke 04/23/2017   Strain of gluteus medius 04/23/2017   OSA (obstructive sleep apnea) 10/17/2015   Periodic limb movement disorder (PLMD) 10/17/2015   Essential hypertension 05/26/2015   Diabetes mellitus ([place]) 05/26/2015   Abdominal wall mass of right lower quadrant 05/25/2015   Hemiparesis affecting right side as late effect of stroke ([place]) 11/09/2014   Abdominal wall seroma    Sepsis due to undetermined organism ([place]) 03/06/2014   Nausea vomiting and diarrhea 03/06/2014   Sepsis ([place]) 03/06/2014   Tension headache 09/01/2013   Nausea with vomiting ?Gastroparesis? 03/21/2013   Spastic hemiplegia affecting dominant side ([place])  02/16/2013   Recurrent ventral incisional hernia s/p closure/repair w mesh 02/04/2013 01/26/2013   Constipation, chronic 01/26/2013   Abdominal pain, chronic, right lower quadrant 06/03/2012   History of CVA (cerebrovascular accident) 06/03/2012   HTN (hypertension) 06/03/2012   Diabetes mellitus type II, uncontrolled (HCC) 10/16/2011   Obesity (BMI 30-39.9) 09/02/2011   Rotator cuff tear 08/25/2011   Sciatica 06/10/2011   Paresthesias in right hand 06/10/2011   Lumbago 05/09/2011   Paresthesias 05/09/2011    ONSET DATE: 05/30/21  REFERRING DIAG: H83.2X9 (ICD-10-CM) - Vestibular dysfunction, unspecified laterality  THERAPY DIAG:  Dizziness and giddiness  Muscle weakness (generalized)  Unsteadiness on feet  Other abnormalities of gait and mobility  Rationale for Evaluation and Treatment Rehabilitation  SUBJECTIVE:   SUBJECTIVE STATEMENT: No changes since she was last here. Feels like she is off balance when she is walking when she goes to take a step. Twisted her ankle the other day when wearing wedges, has been icing it and it has been feeling  better.   Pt accompanied by: self  PERTINENT HISTORY: Anxiety, Angioedema, Chronic Back Pain, CVA with R Sided Weakness, Depression, DM with Neuropathy, GERD, HA, Hx of Vertigo, Migraines, HLD, HTN, Panic Disorder, Restless Leg, R Rotator Cuff Tear, SOB. Old infarctions affecting the pons, left basal  ganglia and bilateral hemispheric white matter.   PAIN:  Are you having pain? Yes: NPRS scale: 5/10 Pain location: R ankle and low back 6/10 pain Pain description: Low back feels like sciatic pain, more of a radiating pain (just started up today), Ankle feels like it is throbbing Aggravating factors: Walking on her ankle, going up the stairs Relieving factors: Elevating it, not putting weight on it.   PRECAUTIONS: Fall   PLOF: Independent with household mobility without device  PATIENT GOALS Improve the Dizziness; Improve the Balance. Be able to walk more without looking drunk  OBJECTIVE:    VESTIBULAR ASSESSMENT  GAIT: Gait pattern: WFL Distance walked: clinic distance with activities Assistive device utilized: None Level of assistance: SBA, CGA, a few episodes of min A during FGA for balance.  Comments: Pt SBA when ambulating in and out of clinic. Pt needing CGA and min A during the FGA for balance, esp with turns, head motions, pt wobbly and needing to take a couple steps at times to maintain her balance. Pt needing to stand with wide BOS at times for her balance.    OTHOSTATICS: Supine: 109/73, HR: 93  Sitting: 109/77 HR: 95 (reports nausea and dizziness from supine > sit) Standing: 108/72 HR: 103 (reports dizziness, nausea, and unsteadiness in standing)  Pt reports that she may not have had enough water today.   FUNCTIONAL GAIT:   [facility] PT Assessment - 06/26/21 1252       Functional Gait  Assessment   Gait assessed  Yes    Gait Level Surface Walks 20 ft, slow speed, abnormal gait pattern, evidence for imbalance or deviates 10-15 in outside of the 12 in walkway width.  Requires more than 7 sec to ambulate 20 ft.   8.1 seconds   Change in Gait Speed Able to change speed, demonstrates mild gait deviations, deviates 6-10 in outside of the 12 in walkway width, or no gait deviations, unable to achieve a major change in velocity, or uses a change in velocity, or uses an assistive device.    Gait with Horizontal Head Turns Performs head turns smoothly with slight change in gait velocity (eg, minor disruption to smooth gait path), deviates   6-10 in outside 12 in walkway width, or uses an assistive device.    Gait with Vertical Head Turns Performs task with severe disruption of gait (eg, staggers 15 in outside 12 in walkway width, loses balance, stops, reaches for wall).   PT needing to assist pt from falling   Gait and Pivot Turn Turns slowly, requires verbal cueing, or requires several small steps to catch balance following turn and stop    Step Over Obstacle Is able to step over one shoe box (4.5 in total height) without changing gait speed. No evidence of imbalance.    Gait with Narrow Base of Support Ambulates less than 4 steps heel to toe or cannot perform without assistance.    Gait with Eyes Closed Walks 20 ft, slow speed, abnormal gait pattern, evidence for imbalance, deviates 10-15 in outside 12 in walkway width. Requires more than 9 sec to ambulate 20 ft.   11.25 seconds   Ambulating Backwards Walks 20 ft, slow speed, abnormal gait pattern, evidence for imbalance, deviates 10-15 in outside 12 in walkway width.   27.81 seconds   Steps Alternating feet, must use rail.    Total Score 12    FGA comment: 12/30 = High Fall Risk            Pt's BP after lightheadedness episode during FGA (after assessing eyes closed) and pt needing to sit down: 122/86, HR: 93 bpm   PATIENT EDUCATION: Education details: Results of orthostatics, FGA. Monitoring BP at home especially when she has her dizziness episodes. Pt does not have a BP cuff at the moment and has not been checking  it at home. Discussed pt reaching out to her cardiologist regarding any questions with her medications (pt reports she is currently on a lot of medications) in regards to her lightheadedness and her BP.  Person educated: Patient Education method: Explanation Education comprehension: verbalized understanding   GOALS: Goals reviewed with patient? Yes  SHORT TERM GOALS: Target date: 07/12/2021  Pt will be independent with initial HEP for improved balance and vestibular input  Baseline: no HEP Established Goal status: INITIAL  2.  FGA TBA and LTG to be set as applicable Baseline: 12/30 Goal status: MET  3.  Pt will report </= 3/5 for all movements on MSQ to indicate improvement in motion sensitivity and improved activity tolerance.   Baseline: 3-4/5 Goal status: INITIAL   LONG TERM GOALS: Target date: 08/02/2021  Pt will be independent with final HEP for improved balance and vestibular  Baseline: no HEP established Goal status: INITIAL  2.  Pt will improve FGA by 4 points from baseline to demonstrate improved balance and reduced fall risk Baseline: 12/30 Goal status: INITIAL  3.  Pt will report </= 2/5 for all movements on MSQ to indicate improvement in motion sensitivity and improved activity tolerance.  Baseline: 3-4/5 Goal status: INITIAL  4.  Pt will improve DVA to </= 2 line difference to indicate improved VOR Baseline: 4 line difference Goal status: INITIAL  5.  LTG to be set for FOTO Baseline: TBA Goal status: INITIAL  ASSESSMENT:  CLINICAL IMPRESSION: Performed orthostatics today with pt negative today with testing. Pt did have incr nausea/dizziness with position changes, but BP stayed about the same (except slight decr in diastolic with standing). Pt had one episode during the FGA where pt got lightheaded and felt that the world was moving and needed to sit down. BP was WFL afterwards. Performed the FGA today with pt scoring a   12/30, indicating a high fall risk.  LTG updated. Pt needing CGA/min A throughout balance test for safety and pt's imbalance. Pt very off balanced with head nods during FGA needing min A for balance and pt unable to walk the full 20'. Will continue to progress towards LTGs.     OBJECTIVE IMPAIRMENTS decreased activity tolerance, decreased balance, decreased endurance, decreased knowledge of use of DME, difficulty walking, decreased strength, dizziness, impaired sensation, and pain.   ACTIVITY LIMITATIONS bending, reach over head, and caring for others  PARTICIPATION LIMITATIONS: cleaning, driving, and community activity  PERSONAL FACTORS Age, Time since onset of injury/illness/exacerbation, and 3+ comorbidities: Anxiety, Angioedema, Chronic Back Pain, CVA with R Sided Weakness, Depression, DM with Neuropathy, GERD, HA, Hx of Vertigo, Migraines, HLD, HTN, Panic Disorder, Restless Leg, R Rotator Cuff Tear, SOB  are also affecting patient's functional outcome.   REHAB POTENTIAL: Good  CLINICAL DECISION MAKING: Evolving/moderate complexity  EVALUATION COMPLEXITY: Moderate   PLAN: PT FREQUENCY: 2x/week  PT DURATION: 6 weeks  PLANNED INTERVENTIONS: Therapeutic exercises, Therapeutic activity, Neuromuscular re-education, Balance training, Gait training, Patient/Family education, Joint manipulation, Joint mobilization, Stair training, Vestibular training, Canalith repositioning, DME instructions, Aquatic Therapy, Dry Needling, Cryotherapy, Moist heat, and Manual therapy  PLAN FOR NEXT SESSION: Initiate HEP focused on gaze stabilization, balance, and habituation. Head motions, EC, narrow BOS.    Arliss Journey, PT, DPT 06/26/2021, 1:26 PM

## 2021-06-28 ENCOUNTER — Ambulatory Visit: Payer: Federal, State, Local not specified - PPO | Admitting: Physical Medicine & Rehabilitation

## 2021-06-28 ENCOUNTER — Ambulatory Visit: Payer: Federal, State, Local not specified - PPO

## 2021-07-03 ENCOUNTER — Ambulatory Visit: Payer: Federal, State, Local not specified - PPO | Admitting: Physical Therapy

## 2021-07-03 ENCOUNTER — Encounter: Payer: Self-pay | Admitting: Physical Therapy

## 2021-07-03 DIAGNOSIS — R42 Dizziness and giddiness: Secondary | ICD-10-CM

## 2021-07-03 DIAGNOSIS — E1165 Type 2 diabetes mellitus with hyperglycemia: Secondary | ICD-10-CM | POA: Diagnosis not present

## 2021-07-03 DIAGNOSIS — R2689 Other abnormalities of gait and mobility: Secondary | ICD-10-CM | POA: Diagnosis not present

## 2021-07-03 DIAGNOSIS — M6281 Muscle weakness (generalized): Secondary | ICD-10-CM

## 2021-07-03 DIAGNOSIS — R2681 Unsteadiness on feet: Secondary | ICD-10-CM

## 2021-07-03 DIAGNOSIS — E1169 Type 2 diabetes mellitus with other specified complication: Secondary | ICD-10-CM | POA: Diagnosis not present

## 2021-07-03 DIAGNOSIS — E1122 Type 2 diabetes mellitus with diabetic chronic kidney disease: Secondary | ICD-10-CM | POA: Diagnosis not present

## 2021-07-03 DIAGNOSIS — H832X9 Labyrinthine dysfunction, unspecified ear: Secondary | ICD-10-CM | POA: Diagnosis not present

## 2021-07-03 NOTE — Patient Instructions (Signed)
  Gaze Stabilization: Sitting    Seated in a stable chair  Keeping eyes on target on wall a few feet away, tilt head down 15-30 and move head side to side for __30__ seconds.   Repeat while moving head up and down 10 times   Perform each 3 times.  Do __2__ sessions per day.     Gaze Stabilization: Tip Card  1.Target must remain in focus, not blurry, and appear stationary while head is in motion. 2.Perform exercises with small head movements (45 to either side of midline). 3.Increase speed of head motion so long as target is in focus. 4.If you wear eyeglasses, be sure you can see target through lens (therapist will give specific instructions for bifocal / progressive lenses). 5.These exercises may provoke dizziness or nausea. Work through these symptoms. If too dizzy, slow head movement slightly. Rest between each exercise. 6.Exercises demand concentration; avoid distractions. 7.For safety, perform standing exercises close to a counter, wall, corner, or next to someone.  Copyright  VHI. All rights reserved.

## 2021-07-03 NOTE — Therapy (Signed)
OUTPATIENT PHYSICAL THERAPY VESTIBULAR TREATMENT     Patient Name: Tina [name] MRN: [id number] DOB:1970-04-28, 51 y.o., female Today's Date: 07/03/2021  PCP: [name], DO REFERRING PROVIDER: [name], MD   PT End of Session - 07/03/21 1235     Visit Number 3    Number of Visits 13    Date for PT Re-Evaluation 08/02/21    Authorization Type BCBS (VL: 50)    PT Start Time 1233    PT Stop Time 1315    PT Time Calculation (min) 42 min    Activity Tolerance Patient tolerated treatment well    Behavior During Therapy WFL for tasks assessed/performed              Past Medical History:  Diagnosis Date   Allergy    Anemia    DURING MENSES--HAS HEAVY BLEEDING WITH PERODS   Angioedema 08/13/2020   Anxiety    Back pain, chronic    "ongoing"   Blood transfusion    IN 2012  AFTER C -SECTION   Cerebral thrombosis with cerebral infarction ([place]) 06/2009   RIGHT SIDED WEAKNESS ( ARM AND LEG ) AND SPASMS-remains with slight weakness and vertigo.   Constipation    Depression    Diabetes mellitus    Diabetic neuropathy (HCC)    BOTH FEET --COMES AND GOES   Edema, lower extremity    Endometriosis    Fatty liver    GERD (gastroesophageal reflux disease)    with pregnancy   H/O eye surgery    Headache(784.0)    MIGRAINES--NOT REALLY HEADACHE-MORE LIKE PRESSURE SENSATION IN HEAD-FEELS DIZZIY AND  FAINT AS THE PRESSURE RESOLVES   Hernia, incisional, RLQ, s/p lap repair Sep 2013 09/02/2011   History of vertigo 03/22/2018   Hx of migraines 10/19/2011   Hyperlipidemia    Hypertension    Leg pain, right    "like bad Charley horse"   Multiple food allergies    Panic disorder without agoraphobia    Rash    HANDS, ARMS --STATES HX OF RASH [place].  STATES THE RASH OFTEN OCCURS WHEN SHE IS REALLY STRESSED."goes and comes-presently left ring finger"   Restless leg syndrome    DIAGNOSED BY SLEEP STUDY - PT TOLD SHE DID NOT HAVE SLEEP  APNEA   Right rotator cuff tear    PAIN IN RIGHT SHOULDER   SBO (small bowel obstruction) ([place]) 06/03/2012   Shortness of breath    Spastic hemiplegia affecting dominant side (HCC)    Stomach problems    Stroke (HCC)    Ventral hernia    RIGHT LOWER QUADRANT-CAUSING SOME PAIN   Weakness of right side of body    Past Surgical History:  Procedure Laterality Date   APPLICATION OF WOUND VAC N/A 05/25/2015   Procedure: APPLICATION OF WOUND VAC;  Surgeon: Michael Boston, MD;  Location: [facility];  Service: General;  Laterality: N/A;   CARDIAC CATHETERIZATION     CESAREAN SECTION  2012   COLONOSCOPY     CORONARY STENT INTERVENTION N/A 09/05/2020   Procedure: CORONARY STENT INTERVENTION;  Surgeon: Nigel Mormon, MD;  Location: [facility];  Service: Cardiovascular;  Laterality: N/A;   DIAGNOSTIC LAPAROSCOPY     ESOPHAGOGASTRODUODENOSCOPY N/A 06/03/2012   Procedure: ESOPHAGOGASTRODUODENOSCOPY (EGD);  Surgeon: [name] Craver, MD;  Location: [facility];  Service: Endoscopy;  Laterality: N/A;   EXCISION MASS ABDOMINAL N/A 05/25/2015   Procedure: ABDOMINAL WALL EXPLORATION EXCISION OF SEROMA REMOVAL OF REDUNDANT SKIN ;  Surgeon: Michael Boston, MD;  Location: [facility];  Service: General;  Laterality: N/A;   EYE SURGERY     Eye laser for vessel hemorrhaging   fybroid removal     HERNIA REPAIR  10/03/2011   ventral hernia repair   INSERTION OF MESH N/A 02/04/2013   Procedure: INSERTION OF MESH;  Surgeon: Adin [facility], MD;  Location: [facility];  Service: General;  Laterality: N/A;   INTRAVASCULAR ULTRASOUND/IVUS N/A 09/05/2020   Procedure: Intravascular Ultrasound/IVUS;  Surgeon: Nigel Mormon, MD;  Location: [facility];  Service: Cardiovascular;  Laterality: N/A;   LEFT HEART CATH AND CORONARY ANGIOGRAPHY N/A 09/05/2020   Procedure: LEFT HEART CATH AND CORONARY ANGIOGRAPHY;  Surgeon: Nigel Mormon, MD;  Location: [facility];  Service: Cardiovascular;   Laterality: N/A;   UMBILICAL HERNIA REPAIR N/A 02/04/2013   Procedure: LAPAROSCOPIC ventral wall hernia repair LAPAROSCOPIC LYSIS OF ADHESIONS laparoscopic exploration of abdomen ;  Surgeon: Adin [facility], MD;  Location: [facility];  Service: General;  Laterality: N/A;   UPPER GASTROINTESTINAL ENDOSCOPY     URETER REVISION     Bilateral "twisted"   UTERINE FIBROID SURGERY     2 SURGERIES FOR FIBROIDS   VENTRAL HERNIA REPAIR  10/03/2011   Procedure: LAPAROSCOPIC VENTRAL HERNIA;  Surgeon: Adin [facility], MD;  Location: [facility];  Service: General;  Laterality: N/A;   Patient Active Problem List   Diagnosis Date Noted   Hypertensive emergency 09/09/2020   Chest pain    Elevated troponin    Status post coronary artery stent placement    Non-ST elevation (NSTEMI) myocardial infarction ([place]) 09/05/2020   NSTEMI (non-ST elevated myocardial infarction) ([place]) 09/05/2020   Hypersomnia, persistent 09/04/2020   OSA on CPAP 09/04/2020   Shortness of breath 08/18/2020   Allergy to alpha-gal 08/18/2020   Seasonal and perennial allergic rhinitis 08/18/2020   Allergy with anaphylaxis due to food 08/18/2020   Pruritus 08/18/2020   Pollen-food allergy 08/18/2020   Allergic reaction 08/13/2020   Angioedema 08/13/2020   Type 2 macular telangiectasis, bilateral 01/03/2020   Excessive daytime sleepiness 09/12/2019   Sleep paralysis 09/12/2019   Hypnopompic hallucination 09/12/2019   Abnormal dreams 09/12/2019   Diabetic macular edema of right eye with proliferative retinopathy associated with type 2 diabetes mellitus ([place]) 07/04/2019   Stable treated proliferative diabetic retinopathy of left eye determined by examination associated with type 2 diabetes mellitus ([place]) 07/04/2019   Long-term insulin use ([place]) 04/07/2019   Depression 04/13/2018   History of vertigo 03/22/2018   Laboratory examination 03/22/2018   Chronic diastolic (congestive) heart failure ([place]) 03/22/2018   Other hyperlipidemia  02/15/2018   Vitamin D deficiency 12/31/2017   Class 2 severe obesity with serious comorbidity and body mass index (BMI) of 39.0 to 39.9 in adult ([place]) 11/18/2017   Gait disturbance, post-stroke 04/23/2017   Strain of gluteus medius 04/23/2017   OSA (obstructive sleep apnea) 10/17/2015   Periodic limb movement disorder (PLMD) 10/17/2015   Essential hypertension 05/26/2015   Diabetes mellitus ([place]) 05/26/2015   Abdominal wall mass of right lower quadrant 05/25/2015   Hemiparesis affecting right side as late effect of stroke ([place]) 11/09/2014   Abdominal wall seroma    Sepsis due to undetermined organism ([place]) 03/06/2014   Nausea vomiting and diarrhea 03/06/2014   Sepsis ([place]) 03/06/2014   Tension headache 09/01/2013   Nausea with vomiting ?Gastroparesis? 03/21/2013   Spastic hemiplegia affecting dominant side ([place]) 02/16/2013   Recurrent ventral incisional hernia  s/p closure/repair w mesh 02/04/2013 01/26/2013   Constipation, chronic 01/26/2013   Abdominal pain, chronic, right lower quadrant 06/03/2012   History of CVA (cerebrovascular accident) 06/03/2012   HTN (hypertension) 06/03/2012   Diabetes mellitus type II, uncontrolled ([place]) 10/16/2011   Obesity (BMI 30-39.9) 09/02/2011   Rotator cuff tear 08/25/2011   Sciatica 06/10/2011   Paresthesias in right hand 06/10/2011   Lumbago 05/09/2011   Paresthesias 05/09/2011    ONSET DATE: 05/30/21  REFERRING DIAG: C80.2M3 (ICD-10-CM) - Vestibular dysfunction, unspecified laterality  THERAPY DIAG:  Dizziness and giddiness  Muscle weakness (generalized)  Unsteadiness on feet  Rationale for Evaluation and Treatment Rehabilitation  SUBJECTIVE:   SUBJECTIVE STATEMENT: Has not been sleeping well recently.   Pt accompanied by: self  PERTINENT HISTORY: Anxiety, Angioedema, Chronic Back Pain, CVA with R Sided Weakness, Depression, DM with Neuropathy, GERD, HA, Hx of Vertigo, Migraines, HLD, HTN, Panic Disorder, Restless Leg, R Rotator  Cuff Tear, SOB. Old infarctions affecting the pons, left basal  ganglia and bilateral hemispheric white matter.   PAIN:  Are you having pain? No  PRECAUTIONS: Fall   PLOF: Independent with household mobility without device  PATIENT GOALS Improve the Dizziness; Improve the Balance. Be able to walk more without looking drunk  OBJECTIVE:   VESTIBULAR TREATMENT:  Gaze Adaptation:  x1 Viewing Horizontal: Position: Seated, Time: 30 seconds, Reps: 3, and Comment: reports mild nausea/headache, reports double vision at times when turning to the L   and x1 Viewing Vertical:  Position: Seated, Time: 30 seconds, Reps: 2, and Comment: Afterwards feels off balance and "wavy", after 2nd rep - pt feeling 7/10 dizzy and nauseous. Performed 10 reps slowly instead of 30 seconds, with pt able to tolerate better and was not as symptomatic. Discussed performing for 10 reps as HEP vs. 30 seconds.    Standing Balance: Surface: Floor Position: Feet Hip Width Apart Completed with: Eyes Closed; 3 x 30 seconds, pt reporting feeling off balance and tapping chair intermittently for balance, but no dizziness    Standing with feet hip width with EO: 2 sets of 5 reps head turns, 2 sets of 5 reps head nods, pt more challenged by balance with head nods, but improved with the 2nd rep with pt feeling unsteady vs. Dizzy. Since pt tolerated 5 reps well, attempted with 10 head turns/nods. Pt reporting 7/10 dizziness after performing head nods.   Tandem Stance:  Surface: Floor Completed with: Eyes Open  Time: Performed 2 x 20 seconds bilat, intermittent taps for balance   GAIT: Gait pattern: WFL Distance walked: clinic distance with activities Assistive device utilized: None Level of assistance: SBA Comments: Pt SBA when ambulating in and out of clinic.    Access Code: [email or web address] URL: [email or web address] Date: 07/03/2021 Prepared by: Janann August  Initiated HEP for standing balance. See  MedBridge for more details. Also added VOR x1   Exercises - Tandem Walking with Counter Support  - 1-2 x daily - 5 x weekly - 3 sets - Standing Balance with Eyes Closed  - 1-2 x daily - 5 x weekly - 3 sets - 30 hold - Standing with Head Rotation  - 1-2 x daily - 5 x weekly - 2 sets - 5 reps - Standing with Head Nod  - 1-2 x daily - 5 x weekly - 2 sets - 5 reps  PATIENT EDUCATION: Education details: Initial HEP for VOR x1 and balance.  Person educated: Patient Education method: Explanation Education comprehension: verbalized understanding  HEP:  Seated VOR (see pt instructions for more details)  Access Code: [email or web address] URL: [email or web address] Date: 07/03/2021 Prepared by: Janann August  Exercises - Tandem Walking with Counter Support  - 1-2 x daily - 5 x weekly - 3 sets - Standing Balance with Eyes Closed  - 1-2 x daily - 5 x weekly - 3 sets - 30 hold - Standing with Head Rotation  - 1-2 x daily - 5 x weekly - 2 sets - 5 reps - Standing with Head Nod  - 1-2 x daily - 5 x weekly - 2 sets - 5 reps   GOALS: Goals reviewed with patient? Yes  SHORT TERM GOALS: Target date: 07/12/2021  Pt will be independent with initial HEP for improved balance and vestibular input  Baseline: no HEP Established Goal status: INITIAL  2.  FGA TBA and LTG to be set as applicable Baseline: 61/53 Goal status: MET  3.  Pt will report </= 3/5 for all movements on MSQ to indicate improvement in motion sensitivity and improved activity tolerance.   Baseline: 3-4/5 Goal status: INITIAL   LONG TERM GOALS: Target date: 08/02/2021  Pt will be independent with final HEP for improved balance and vestibular  Baseline: no HEP established Goal status: INITIAL  2.  Pt will improve FGA by 4 points from baseline to demonstrate improved balance and reduced fall risk Baseline: 12/30 Goal status: INITIAL  3.  Pt will report </= 2/5 for all movements on MSQ to indicate improvement in motion  sensitivity and improved activity tolerance.  Baseline: 3-4/5 Goal status: INITIAL  4.  Pt will improve DVA to </= 2 line difference to indicate improved VOR Baseline: 4 line difference Goal status: INITIAL  5.  LTG to be set for FOTO Baseline: TBA Goal status: INITIAL  ASSESSMENT:  CLINICAL IMPRESSION: Initiated HEP today for balance and VOR x1. Attempted to perform VOR x1 for 30 seconds with vertical head movements, but rated symptoms as 7/10 afterwards. Pt able to tolerate better when only performing 10 reps. Pt challenged more by head nods vs. Turns in standing for balance. Pt able to tolerate performing 5 head motions at a time and was more symptomatic while performing 10. Will continue to progress towards LTGs.     OBJECTIVE IMPAIRMENTS decreased activity tolerance, decreased balance, decreased endurance, decreased knowledge of use of DME, difficulty walking, decreased strength, dizziness, impaired sensation, and pain.   ACTIVITY LIMITATIONS bending, reach over head, and caring for others  PARTICIPATION LIMITATIONS: cleaning, driving, and community activity  PERSONAL FACTORS Age, Time since onset of injury/illness/exacerbation, and 3+ comorbidities: Anxiety, Angioedema, Chronic Back Pain, CVA with R Sided Weakness, Depression, DM with Neuropathy, GERD, HA, Hx of Vertigo, Migraines, HLD, HTN, Panic Disorder, Restless Leg, R Rotator Cuff Tear, SOB  are also affecting patient's functional outcome.   REHAB POTENTIAL: Good  CLINICAL DECISION MAKING: Evolving/moderate complexity  EVALUATION COMPLEXITY: Moderate   PLAN: PT FREQUENCY: 2x/week  PT DURATION: 6 weeks  PLANNED INTERVENTIONS: Therapeutic exercises, Therapeutic activity, Neuromuscular re-education, Balance training, Gait training, Patient/Family education, Joint manipulation, Joint mobilization, Stair training, Vestibular training, Canalith repositioning, DME instructions, Aquatic Therapy, Dry Needling, Cryotherapy,  Moist heat, and Manual therapy  PLAN FOR NEXT SESSION: Add to HEP for habituation. Head motions, EC, narrow BOS. Progress gaze exercises.    Arliss Journey, PT, DPT 07/03/2021, 1:25 PM

## 2021-07-05 ENCOUNTER — Ambulatory Visit: Payer: Federal, State, Local not specified - PPO

## 2021-07-05 VITALS — BP 128/85 | HR 74

## 2021-07-05 DIAGNOSIS — R42 Dizziness and giddiness: Secondary | ICD-10-CM | POA: Diagnosis not present

## 2021-07-05 DIAGNOSIS — R2689 Other abnormalities of gait and mobility: Secondary | ICD-10-CM | POA: Diagnosis not present

## 2021-07-05 DIAGNOSIS — H832X9 Labyrinthine dysfunction, unspecified ear: Secondary | ICD-10-CM | POA: Diagnosis not present

## 2021-07-05 DIAGNOSIS — M6281 Muscle weakness (generalized): Secondary | ICD-10-CM | POA: Diagnosis not present

## 2021-07-05 DIAGNOSIS — R2681 Unsteadiness on feet: Secondary | ICD-10-CM | POA: Diagnosis not present

## 2021-07-10 ENCOUNTER — Ambulatory Visit: Payer: Federal, State, Local not specified - PPO

## 2021-07-10 VITALS — BP 96/62 | HR 82

## 2021-07-10 DIAGNOSIS — R42 Dizziness and giddiness: Secondary | ICD-10-CM | POA: Diagnosis not present

## 2021-07-10 DIAGNOSIS — R2681 Unsteadiness on feet: Secondary | ICD-10-CM

## 2021-07-10 DIAGNOSIS — M6281 Muscle weakness (generalized): Secondary | ICD-10-CM

## 2021-07-10 DIAGNOSIS — R2689 Other abnormalities of gait and mobility: Secondary | ICD-10-CM

## 2021-07-10 DIAGNOSIS — H832X9 Labyrinthine dysfunction, unspecified ear: Secondary | ICD-10-CM | POA: Diagnosis not present

## 2021-07-10 NOTE — Therapy (Signed)
OUTPATIENT PHYSICAL THERAPY VESTIBULAR TREATMENT     Patient Name: Tina [name] MRN: [id number] DOB:08/07/1970, 51 y.o., female Today's Date: 07/10/2021  PCP: [name], DO REFERRING PROVIDER: [name], MD   PT End of Session - 07/10/21 1315     Visit Number 5    Number of Visits 13    Date for PT Re-Evaluation 08/02/21    Authorization Type BCBS (VL: 50)    PT Start Time 1315    PT Stop Time 1335    PT Time Calculation (min) 20 min    Activity Tolerance Patient tolerated treatment well    Behavior During Therapy WFL for tasks assessed/performed              Past Medical History:  Diagnosis Date   Allergy    Anemia    DURING MENSES--HAS HEAVY BLEEDING WITH PERODS   Angioedema 08/13/2020   Anxiety    Back pain, chronic    "ongoing"   Blood transfusion    IN 2012  AFTER C -SECTION   Cerebral thrombosis with cerebral infarction ([place]) 06/2009   RIGHT SIDED WEAKNESS ( ARM AND LEG ) AND SPASMS-remains with slight weakness and vertigo.   Constipation    Depression    Diabetes mellitus    Diabetic neuropathy (HCC)    BOTH FEET --COMES AND GOES   Edema, lower extremity    Endometriosis    Fatty liver    GERD (gastroesophageal reflux disease)    with pregnancy   H/O eye surgery    Headache(784.0)    MIGRAINES--NOT REALLY HEADACHE-MORE LIKE PRESSURE SENSATION IN HEAD-FEELS DIZZIY AND  FAINT AS THE PRESSURE RESOLVES   Hernia, incisional, RLQ, s/p lap repair Sep 2013 09/02/2011   History of vertigo 03/22/2018   Hx of migraines 10/19/2011   Hyperlipidemia    Hypertension    Leg pain, right    "like bad Charley horse"   Multiple food allergies    Panic disorder without agoraphobia    Rash    HANDS, ARMS --STATES HX OF RASH [place].  STATES THE RASH OFTEN OCCURS WHEN SHE IS REALLY STRESSED."goes and comes-presently left ring finger"   Restless leg syndrome    DIAGNOSED BY SLEEP STUDY - PT TOLD SHE DID NOT HAVE SLEEP  APNEA   Right rotator cuff tear    PAIN IN RIGHT SHOULDER   SBO (small bowel obstruction) ([place]) 06/03/2012   Shortness of breath    Spastic hemiplegia affecting dominant side (HCC)    Stomach problems    Stroke (HCC)    Ventral hernia    RIGHT LOWER QUADRANT-CAUSING SOME PAIN   Weakness of right side of body    Past Surgical History:  Procedure Laterality Date   APPLICATION OF WOUND VAC N/A 05/25/2015   Procedure: APPLICATION OF WOUND VAC;  Surgeon: Michael Boston, MD;  [facility];  Service: General;  Laterality: N/A;   CARDIAC CATHETERIZATION     CESAREAN SECTION  2012   COLONOSCOPY     CORONARY STENT INTERVENTION N/A 09/05/2020   Procedure: CORONARY STENT INTERVENTION;  Surgeon: Nigel Mormon, MD;  Location: [place] [facility];  Service: Cardiovascular;  Laterality: N/A;   DIAGNOSTIC LAPAROSCOPY     ESOPHAGOGASTRODUODENOSCOPY N/A 06/03/2012   Procedure: ESOPHAGOGASTRODUODENOSCOPY (EGD);  Surgeon: [name] Craver, MD;  Location: [facility] END[facility]COPY;  Service: Endoscopy;  Laterality: N/A;   EXCISION MASS ABDOMINAL N/A 05/25/2015   Procedure: ABDOMINAL WALL EXPLORATION EXCISION OF SEROMA REMOVAL OF REDUNDANT SKIN ;  Surgeon: Michael Boston, MD;  [facility];  Service: General;  Laterality: N/A;   EYE SURGERY     Eye laser for vessel hemorrhaging   fybroid removal     HERNIA REPAIR  10/03/2011   ventral hernia repair   INSERTION OF MESH N/A 02/04/2013   Procedure: INSERTION OF MESH;  Surgeon: Adin Hector, MD;  [facility];  Service: General;  Laterality: N/A;   INTRAVASCULAR ULTRASOUND/IVUS N/A 09/05/2020   Procedure: Intravascular Ultrasound/IVUS;  Surgeon: Nigel Mormon, MD;  Location: [place] [facility];  Service: Cardiovascular;  Laterality: N/A;   LEFT HEART CATH AND CORONARY ANGIOGRAPHY N/A 09/05/2020   Procedure: LEFT HEART CATH AND CORONARY ANGIOGRAPHY;  Surgeon: Nigel Mormon, MD;  Location: [place] [facility];  Service: Cardiovascular;   Laterality: N/A;   UMBILICAL HERNIA REPAIR N/A 02/04/2013   Procedure: LAPAROSCOPIC ventral wall hernia repair LAPAROSCOPIC LYSIS OF ADHESIONS laparoscopic exploration of abdomen ;  Surgeon: Adin Hector, MD;  [facility];  Service: General;  Laterality: N/A;   UPPER GASTROINTESTINAL ENDOSCOPY     URETER REVISION     Bilateral "twisted"   UTERINE FIBROID SURGERY     2 SURGERIES FOR FIBROIDS   VENTRAL HERNIA REPAIR  10/03/2011   Procedure: LAPAROSCOPIC VENTRAL HERNIA;  Surgeon: Adin Hector, MD;  [facility];  Service: General;  Laterality: N/A;   Patient Active Problem List   Diagnosis Date Noted   Hypertensive emergency 09/09/2020   Chest pain    Elevated troponin    Status post coronary artery stent placement    Non-ST elevation (NSTEMI) myocardial infarction ([place]) 09/05/2020   NSTEMI (non-ST elevated myocardial infarction) ([place]) 09/05/2020   Hypersomnia, persistent 09/04/2020   OSA on CPAP 09/04/2020   Shortness of breath 08/18/2020   Allergy to alpha-gal 08/18/2020   Seasonal and perennial allergic rhinitis 08/18/2020   Allergy with anaphylaxis due to food 08/18/2020   Pruritus 08/18/2020   Pollen-food allergy 08/18/2020   Allergic reaction 08/13/2020   Angioedema 08/13/2020   Type 2 macular telangiectasis, bilateral 01/03/2020   Excessive daytime sleepiness 09/12/2019   Sleep paralysis 09/12/2019   Hypnopompic hallucination 09/12/2019   Abnormal dreams 09/12/2019   Diabetic macular edema of right eye with proliferative retinopathy associated with type 2 diabetes mellitus ([place]) 07/04/2019   Stable treated proliferative diabetic retinopathy of left eye determined by examination associated with type 2 diabetes mellitus ([name]) 07/04/2019   Long-term insulin use ([place]) 04/07/2019   Depression 04/13/2018   History of vertigo 03/22/2018   Laboratory examination 03/22/2018   Chronic diastolic (congestive) heart failure ([place]) 03/22/2018   Other hyperlipidemia  02/15/2018   Vitamin D deficiency 12/31/2017   Class 2 severe obesity with serious comorbidity and body mass index (BMI) of 39.0 to 39.9 in adult ([place]) 11/18/2017   Gait disturbance, post-stroke 04/23/2017   Strain of gluteus medius 04/23/2017   OSA (obstructive sleep apnea) 10/17/2015   Periodic limb movement disorder (PLMD) 10/17/2015   Essential hypertension 05/26/2015   Diabetes mellitus ([place]) 05/26/2015   Abdominal wall mass of right lower quadrant 05/25/2015   Hemiparesis affecting right side as late effect of stroke ([place]) 11/09/2014   Abdominal wall seroma    Sepsis due to undetermined organism ([place]) 03/06/2014   Nausea vomiting and diarrhea 03/06/2014   Sepsis ([place]) 03/06/2014   Tension headache 09/01/2013   Nausea with vomiting ?Gastroparesis? 03/21/2013   Spastic hemiplegia affecting dominant side ([place]) 02/16/2013   Recurrent ventral incisional hernia  s/p closure/repair w mesh 02/04/2013 01/26/2013   Constipation, chronic 01/26/2013   Abdominal pain, chronic, right lower quadrant 06/03/2012   History of CVA (cerebrovascular accident) 06/03/2012   HTN (hypertension) 06/03/2012   Diabetes mellitus type II, uncontrolled ([place]) 10/16/2011   Obesity (BMI 30-39.9) 09/02/2011   Rotator cuff tear 08/25/2011   Sciatica 06/10/2011   Paresthesias in right hand 06/10/2011   Lumbago 05/09/2011   Paresthesias 05/09/2011    ONSET DATE: 05/30/21  REFERRING DIAG: Q67.6P9 (ICD-10-CM) - Vestibular dysfunction, unspecified laterality  THERAPY DIAG:  Dizziness and giddiness  Muscle weakness (generalized)  Unsteadiness on feet  Other abnormalities of gait and mobility  Rationale for Evaluation and Treatment Rehabilitation  SUBJECTIVE:   SUBJECTIVE STATEMENT: Patient reports on Sunday, had some dizziness due to some extreme lighting and flashing lights at [place]. Reports it made her very dizzy. After church from 12-7pm just had to lay down. No longer going to be taken gabapentin  during the day. No falls. Reports feels very groggy and dizzy currently 8/10. Not sure how much she can tolerate today.    Pt accompanied by: self  PERTINENT HISTORY: Anxiety, Angioedema, Chronic Back Pain, CVA with R Sided Weakness, Depression, DM with Neuropathy, GERD, HA, Hx of Vertigo, Migraines, HLD, HTN, Panic Disorder, Restless Leg, R Rotator Cuff Tear, SOB. Old infarctions affecting the pons, left basal  ganglia and bilateral hemispheric white matter.   PAIN:  Are you having pain? No Today's Vitals   07/10/21 1325  BP: 96/62  Pulse: 82   There is no height or weight on file to calculate BMI.  Blood Sugar: 128  PRECAUTIONS: Fall   PLOF: Independent with household mobility without device  PATIENT GOALS Improve the Dizziness; Improve the Balance. Be able to walk more without looking drunk  OBJECTIVE:   VESTIBULAR TREATMENT: Gaze Adaptation: x1 Viewing Horizontal: Position: Seated, Time: x 30 seconds. Reps: 2, and Comment: able to maintain eyes focused, but severe dizziness. Unable to tolerate additional reps.   x1 Viewing Vertical:  Position: Seated, Time: x 30 seconds and Comment: patient reports 10/10 dizziness, nausea, and feels like her brain is moving, increased headache. Patient having to close eyes.   GAIT: Gait pattern: WFL Distance walked: clinic distance, very unsteady Assistive device utilized: None Level of assistance: SBA Comments: Pt SBA when ambulating in and out of clinic.    PATIENT EDUCATION: Education details: Monitor BP due to low BP readings Person educated: Patient Education method: Explanation Education comprehension: verbalized understanding  HEP: Seated VOR (see pt instructions for more details)  Access Code: [email or web address]   GOALS: Goals reviewed with patient? Yes  SHORT TERM GOALS: Target date: 07/12/2021  Pt will be independent with initial HEP for improved balance and vestibular input  Baseline: no HEP Established Goal status:  INITIAL  2.  FGA TBA and LTG to be set as applicable Baseline: 50/93 Goal status: MET  3.  Pt will report </= 3/5 for all movements on MSQ to indicate improvement in motion sensitivity and improved activity tolerance.   Baseline: 3-4/5 Goal status: INITIAL   LONG TERM GOALS: Target date: 08/02/2021  Pt will be independent with final HEP for improved balance and vestibular  Baseline: no HEP established Goal status: INITIAL  2.  Pt will improve FGA by 4 points from baseline to demonstrate improved balance and reduced fall risk Baseline: 12/30 Goal status: INITIAL  3.  Pt will report </= 2/5 for all movements on MSQ to indicate improvement in motion sensitivity  and improved activity tolerance.  Baseline: 3-4/5 Goal status: INITIAL  4.  Pt will improve DVA to </= 2 line difference to indicate improved VOR Baseline: 4 line difference Goal status: INITIAL  5.  LTG to be set for FOTO Baseline: TBA Goal status: INITIAL  ASSESSMENT:  CLINICAL IMPRESSION: Todays session limited due to severe dizziness and symptoms limiting participation. Patient also demonstrating low BP reading that may also be contributing to patient's increased symptoms/drowsiness. PT educating on continuing to monitor BP. Will continue per POC.     OBJECTIVE IMPAIRMENTS decreased activity tolerance, decreased balance, decreased endurance, decreased knowledge of use of DME, difficulty walking, decreased strength, dizziness, impaired sensation, and pain.   ACTIVITY LIMITATIONS bending, reach over head, and caring for others  PARTICIPATION LIMITATIONS: cleaning, driving, and community activity  PERSONAL FACTORS Age, Time since onset of injury/illness/exacerbation, and 3+ comorbidities: Anxiety, Angioedema, Chronic Back Pain, CVA with R Sided Weakness, Depression, DM with Neuropathy, GERD, HA, Hx of Vertigo, Migraines, HLD, HTN, Panic Disorder, Restless Leg, R Rotator Cuff Tear, SOB  are also affecting patient's  functional outcome.   REHAB POTENTIAL: Good  CLINICAL DECISION MAKING: Evolving/moderate complexity  EVALUATION COMPLEXITY: Moderate   PLAN: PT FREQUENCY: 2x/week  PT DURATION: 6 weeks  PLANNED INTERVENTIONS: Therapeutic exercises, Therapeutic activity, Neuromuscular re-education, Balance training, Gait training, Patient/Family education, Joint manipulation, Joint mobilization, Stair training, Vestibular training, Canalith repositioning, DME instructions, Aquatic Therapy, Dry Needling, Cryotherapy, Moist heat, and Manual therapy  PLAN FOR NEXT SESSION: Monitor BP. Bending/180 deg turns. Head motions, EC, narrow BOS. Progress gaze exercises.    Jones Bales, PT, DPT 07/10/2021, 1:46 PM

## 2021-07-11 ENCOUNTER — Encounter
Payer: Federal, State, Local not specified - PPO | Attending: Physical Medicine & Rehabilitation | Admitting: Physical Medicine & Rehabilitation

## 2021-07-11 ENCOUNTER — Encounter: Payer: Self-pay | Admitting: Physical Medicine & Rehabilitation

## 2021-07-11 VITALS — BP 104/70 | HR 79 | Ht 66.0 in | Wt 223.8 lb

## 2021-07-11 DIAGNOSIS — H832X9 Labyrinthine dysfunction, unspecified ear: Secondary | ICD-10-CM

## 2021-07-11 DIAGNOSIS — I69351 Hemiplegia and hemiparesis following cerebral infarction affecting right dominant side: Secondary | ICD-10-CM

## 2021-07-11 NOTE — Progress Notes (Signed)
Subjective:    Patient ID: Tina [name], female    DOB: 1970/05/08, 51 y.o.   MRN: [id number]  HPI  51 year old female with history of bilateral pontine strokes with residual mild right hemiparesis and balance disorder who has been complaining of increasing dizziness.  She has both room spinning type symptoms as well as lightheaded type symptoms.  She was seen by physical therapy for vestibular exercises which have been partially helpful.  She had negative orthostatic changes during initial PT eval.  The patient has been running a bit lower on her blood pressures in general around 979 systolic.  She is now enrolled in a prescription drug program which puts all her meds in blister packs and makes it easier to compliant with her regimen.  She states her blood pressures have never been this low.  Pain Inventory Average Pain 7 Pain Right Now 7 My pain is constant, sharp, burning, and stabbing  In the last 24 hours, has pain interfered with the following? General activity 8 Relation with others 8 Enjoyment of life 8 What TIME of day is your pain at its worst? morning , daytime, evening, and night Sleep (in general) Poor  Pain is worse with: walking, sitting, and standing Pain improves with: rest, heat/ice, and medication Relief from Meds: 9  Family History  Problem Relation Age of Onset   Diabetes Father    Kidney disease Father    Depression Father    Drug abuse Father    Allergic rhinitis Mother    Eczema Mother    Urticaria Mother    Depression Mother    Anxiety disorder Mother    Bipolar disorder Mother    Alcoholism Mother    Drug abuse Mother    Eating disorder Mother    Diabetes Maternal Grandmother    Hyperlipidemia Paternal Grandmother    Stroke Paternal Grandmother    Eczema Sister    Urticaria Sister    Colon cancer Paternal Uncle    Other Neg Hx    Angioedema Neg Hx    Asthma Neg Hx    Colon polyps Neg Hx    Esophageal cancer Neg Hx    Rectal cancer Neg  Hx    Stomach cancer Neg Hx    Social History   Socioeconomic History   Marital status: Married    Spouse name: Annie Main   Number of children: 3   Years of education: BA degree   Highest education level: Not on file  Occupational History   Occupation: stay at home mom    Employer: UNEMPLOYED  Tobacco Use   Smoking status: Never   Smokeless tobacco: Never  Vaping Use   Vaping Use: Never used  Substance and Sexual Activity   Alcohol use: No    Alcohol/week: 0.0 standard drinks of alcohol   Drug use: No   Sexual activity: Yes    Birth control/protection: None  Other Topics Concern   Not on file  Social History Narrative   Patient is married with 2 children.   Patient is right handed.   Patient has her  BA degree.   Patient drinks 1 cup daily.   Social Determinants of Health   Financial Resource Strain: Not on file  Food Insecurity: No Food Insecurity (07/27/2019)   Hunger Vital Sign    Worried About Running Out of Food in the Last Year: Never true    Ran Out of Food in the Last Year: Never true  Transportation Needs: No Transportation  Needs (07/27/2019)   PRAPARE - Hydrologist (Medical): No    Lack of Transportation (Non-Medical): No  Physical Activity: Not on file  Stress: Not on file  Social Connections: Not on file   Past Surgical History:  Procedure Laterality Date   APPLICATION OF WOUND VAC N/A 05/25/2015   Procedure: APPLICATION OF WOUND VAC;  Surgeon: Michael Boston, MD;  [facility];  Service: General;  Laterality: N/A;   CARDIAC CATHETERIZATION     CESAREAN SECTION  2012   COLONOSCOPY     CORONARY STENT INTERVENTION N/A 09/05/2020   Procedure: CORONARY STENT INTERVENTION;  Surgeon: Nigel Mormon, MD;  Location: [place] [facility];  Service: Cardiovascular;  Laterality: N/A;   DIAGNOSTIC LAPAROSCOPY     ESOPHAGOGASTRODUODENOSCOPY N/A 06/03/2012   Procedure: ESOPHAGOGASTRODUODENOSCOPY (EGD);  Surgeon: [name] Craver, MD;   Location: [facility] ENDOSCOPY;  Service: Endoscopy;  Laterality: N/A;   EXCISION MASS ABDOMINAL N/A 05/25/2015   Procedure: ABDOMINAL WALL EXPLORATION EXCISION OF SEROMA REMOVAL OF REDUNDANT SKIN ;  Surgeon: Michael Boston, MD;  [facility];  Service: General;  Laterality: N/A;   EYE SURGERY     Eye laser for vessel hemorrhaging   fybroid removal     HERNIA REPAIR  10/03/2011   ventral hernia repair   INSERTION OF MESH N/A 02/04/2013   Procedure: INSERTION OF MESH;  Surgeon: Adin Hector, MD;  [facility];  Service: General;  Laterality: N/A;   INTRAVASCULAR ULTRASOUND/IVUS N/A 09/05/2020   Procedure: Intravascular Ultrasound/IVUS;  Surgeon: Nigel Mormon, MD;  Location: [place] [facility];  Service: Cardiovascular;  Laterality: N/A;   LEFT HEART CATH AND CORONARY ANGIOGRAPHY N/A 09/05/2020   Procedure: LEFT HEART CATH AND CORONARY ANGIOGRAPHY;  Surgeon: Nigel Mormon, MD;  Location: [place] [facility];  Service: Cardiovascular;  Laterality: N/A;   UMBILICAL HERNIA REPAIR N/A 02/04/2013   Procedure: LAPAROSCOPIC ventral wall hernia repair LAPAROSCOPIC LYSIS OF ADHESIONS laparoscopic exploration of abdomen ;  Surgeon: Adin Hector, MD;  [facility];  Service: General;  Laterality: N/A;   UPPER GASTROINTESTINAL ENDOSCOPY     URETER REVISION     Bilateral "twisted"   UTERINE FIBROID SURGERY     2 SURGERIES FOR FIBROIDS   VENTRAL HERNIA REPAIR  10/03/2011   Procedure: LAPAROSCOPIC VENTRAL HERNIA;  Surgeon: Adin Hector, MD;  [facility];  Service: General;  Laterality: N/A;   Past Surgical History:  Procedure Laterality Date   APPLICATION OF WOUND VAC N/A 05/25/2015   Procedure: APPLICATION OF WOUND VAC;  Surgeon: Michael Boston, MD;  [facility];  Service: General;  Laterality: N/A;   CARDIAC CATHETERIZATION     CESAREAN SECTION  2012   COLONOSCOPY     CORONARY STENT INTERVENTION N/A 09/05/2020   Procedure: CORONARY STENT INTERVENTION;  Surgeon:  Nigel Mormon, MD;  Location: [place] [facility];  Service: Cardiovascular;  Laterality: N/A;   DIAGNOSTIC LAPAROSCOPY     ESOPHAGOGASTRODUODENOSCOPY N/A 06/03/2012   Procedure: ESOPHAGOGASTRODUODENOSCOPY (EGD);  Surgeon: [name] Craver, MD;  Location: [facility] ENDOSCOPY;  Service: Endoscopy;  Laterality: N/A;   EXCISION MASS ABDOMINAL N/A 05/25/2015   Procedure: ABDOMINAL WALL EXPLORATION EXCISION OF SEROMA REMOVAL OF REDUNDANT SKIN ;  Surgeon: Michael Boston, MD;  [facility];  Service: General;  Laterality: N/A;   EYE SURGERY     Eye laser for vessel hemorrhaging   fybroid removal     HERNIA REPAIR  10/03/2011  ventral hernia repair   INSERTION OF MESH N/A 02/04/2013   Procedure: INSERTION OF MESH;  Surgeon: Adin Hector, MD;  [facility];  Service: General;  Laterality: N/A;   INTRAVASCULAR ULTRASOUND/IVUS N/A 09/05/2020   Procedure: Intravascular Ultrasound/IVUS;  Surgeon: Nigel Mormon, MD;  Location: [place] [facility];  Service: Cardiovascular;  Laterality: N/A;   LEFT HEART CATH AND CORONARY ANGIOGRAPHY N/A 09/05/2020   Procedure: LEFT HEART CATH AND CORONARY ANGIOGRAPHY;  Surgeon: Nigel Mormon, MD;  Location: [place] [facility];  Service: Cardiovascular;  Laterality: N/A;   UMBILICAL HERNIA REPAIR N/A 02/04/2013   Procedure: LAPAROSCOPIC ventral wall hernia repair LAPAROSCOPIC LYSIS OF ADHESIONS laparoscopic exploration of abdomen ;  Surgeon: Adin Hector, MD;  [facility];  Service: General;  Laterality: N/A;   UPPER GASTROINTESTINAL ENDOSCOPY     URETER REVISION     Bilateral "twisted"   UTERINE FIBROID SURGERY     2 SURGERIES FOR FIBROIDS   VENTRAL HERNIA REPAIR  10/03/2011   Procedure: LAPAROSCOPIC VENTRAL HERNIA;  Surgeon: Adin Hector, MD;  [facility];  Service: General;  Laterality: N/A;   Past Medical History:  Diagnosis Date   Allergy    Anemia    DURING MENSES--HAS HEAVY BLEEDING WITH PERODS   Angioedema 08/13/2020    Anxiety    Back pain, chronic    "ongoing"   Blood transfusion    IN 2012  AFTER C -SECTION   Cerebral thrombosis with cerebral infarction ([place]) 06/2009   RIGHT SIDED WEAKNESS ( ARM AND LEG ) AND SPASMS-remains with slight weakness and vertigo.   Constipation    Depression    Diabetes mellitus    Diabetic neuropathy (HCC)    BOTH FEET --COMES AND GOES   Edema, lower extremity    Endometriosis    Fatty liver    GERD (gastroesophageal reflux disease)    with pregnancy   H/O eye surgery    Headache(784.0)    MIGRAINES--NOT REALLY HEADACHE-MORE LIKE PRESSURE SENSATION IN HEAD-FEELS DIZZIY AND  FAINT AS THE PRESSURE RESOLVES   Hernia, incisional, RLQ, s/p lap repair Sep 2013 09/02/2011   History of vertigo 03/22/2018   Hx of migraines 10/19/2011   Hyperlipidemia    Hypertension    Leg pain, right    "like bad Charley horse"   Multiple food allergies    Panic disorder without agoraphobia    Rash    HANDS, ARMS --STATES HX OF RASH [place].  STATES THE RASH OFTEN OCCURS WHEN SHE IS REALLY STRESSED."goes and comes-presently left ring finger"   Restless leg syndrome    DIAGNOSED BY SLEEP STUDY - PT TOLD SHE DID NOT HAVE SLEEP APNEA   Right rotator cuff tear    PAIN IN RIGHT SHOULDER   SBO (small bowel obstruction) ([place]) 06/03/2012   Shortness of breath    Spastic hemiplegia affecting dominant side (HCC)    Stomach problems    Stroke (HCC)    Ventral hernia    RIGHT LOWER QUADRANT-CAUSING SOME PAIN   Weakness of right side of body    BP 104/70   Pulse 79   Ht '5\' 6"'$  (1.676 m)   Wt 223 lb 12.8 oz (101.5 kg)   SpO2 98%   BMI 36.12 kg/m   Opioid Risk Score:   Fall Risk Score:  `1  Depression screen [facility] 2/9     07/11/2021   11:20 AM 05/30/2021   10:56 AM 03/26/2021  10:21 AM 01/02/2021    4:30 PM 12/25/2020    2:14 PM 11/09/2020    4:15 PM 11/02/2020   10:46 AM  Depression screen PHQ 2/9  Decreased Interest 0 1 1 0 1 3 0  Down, Depressed,  Hopeless 0 1 1 0 '1 3 1  '$ PHQ - 2 Score 0 2 2 0 '2 6 1  '$ Altered sleeping      3   Tired, decreased energy      3   Change in appetite      2   Feeling bad or failure about yourself       0   Trouble concentrating      2   Moving slowly or fidgety/restless      1   Suicidal thoughts      0   PHQ-9 Score      17   Difficult doing work/chores      Somewhat difficult      Review of Systems  Constitutional: Negative.   HENT: Negative.    Eyes: Negative.   Respiratory: Negative.    Cardiovascular: Negative.   Gastrointestinal: Negative.   Endocrine: Negative.   Genitourinary: Negative.   Musculoskeletal:  Positive for back pain.  Skin: Negative.   Allergic/Immunologic: Negative.   Neurological: Negative.   Hematological: Negative.   Psychiatric/Behavioral:  Positive for sleep disturbance.       Objective:   Physical Exam Vitals reviewed.  Constitutional:      Appearance: She is obese.  HENT:     Head: Normocephalic and atraumatic.  Eyes:     Extraocular Movements: Extraocular movements intact.     Conjunctiva/sclera: Conjunctivae normal.     Pupils: Pupils are equal, round, and reactive to light.  Skin:    General: Skin is warm and dry.  Neurological:     Mental Status: She is alert and oriented to person, place, and time.     Comments: Motor strength is 4/5 right deltoid bicep tricep grip hip flexor knee extensor ankle dorsiflexor Left side is 5/5 Intact finger-nose-finger testing bilaterally Extraocular muscles intact no evidence of nystagmus  Psychiatric:        Mood and Affect: Mood normal.        Behavior: Behavior normal.           Assessment & Plan:  1.  Dizziness appears to be multifactorial she does have central nervous system dysfunction bilaterally in the brainstem which is chronic and likely superimposed post peripheral vestibular dysfunction.  In addition she has had increased compliance with her antihypertensive medication regimen.  While no documented  orthostatic changes she is overall running at a lower BP and this may be causing some symptoms for her.  She will follow-up with her primary care on this. Physical medicine rehab follow-up in 6 weeks

## 2021-07-11 NOTE — Patient Instructions (Signed)
Please skip you noon Hydralazine today

## 2021-07-12 ENCOUNTER — Ambulatory Visit: Payer: Federal, State, Local not specified - PPO

## 2021-07-12 DIAGNOSIS — E785 Hyperlipidemia, unspecified: Secondary | ICD-10-CM | POA: Diagnosis not present

## 2021-07-12 DIAGNOSIS — E1142 Type 2 diabetes mellitus with diabetic polyneuropathy: Secondary | ICD-10-CM | POA: Diagnosis not present

## 2021-07-12 DIAGNOSIS — I10 Essential (primary) hypertension: Secondary | ICD-10-CM | POA: Diagnosis not present

## 2021-07-12 DIAGNOSIS — E1122 Type 2 diabetes mellitus with diabetic chronic kidney disease: Secondary | ICD-10-CM | POA: Diagnosis not present

## 2021-07-17 ENCOUNTER — Ambulatory Visit: Payer: Federal, State, Local not specified - PPO | Attending: Physical Medicine & Rehabilitation

## 2021-07-17 DIAGNOSIS — R2689 Other abnormalities of gait and mobility: Secondary | ICD-10-CM | POA: Diagnosis not present

## 2021-07-17 DIAGNOSIS — M6281 Muscle weakness (generalized): Secondary | ICD-10-CM | POA: Insufficient documentation

## 2021-07-17 DIAGNOSIS — R2681 Unsteadiness on feet: Secondary | ICD-10-CM | POA: Insufficient documentation

## 2021-07-17 DIAGNOSIS — R42 Dizziness and giddiness: Secondary | ICD-10-CM | POA: Diagnosis not present

## 2021-07-17 NOTE — Therapy (Signed)
OUTPATIENT PHYSICAL THERAPY VESTIBULAR TREATMENT     Patient Name: Tina [name] MRN: [id number] DOB:1970/02/27, 51 y.o., female Today's Date: 07/17/2021  PCP: [name], DO REFERRING PROVIDER: [name], MD   PT End of Session - 07/17/21 1403     Visit Number 6    Number of Visits 13    Date for PT Re-Evaluation 08/02/21    Authorization Type BC[employer] (VL: 50)    PT Start Time 1401    PT Stop Time 1435    PT Time Calculation (min) 34 min    Activity Tolerance Patient tolerated treatment well    Behavior During Therapy WFL for tasks assessed/performed              Past Medical History:  Diagnosis Date   Allergy    Anemia    DURING MENSES--HAS HEAVY BLEEDING WITH PERODS   Angioedema 08/13/2020   Anxiety    Back pain, chronic    "ongoing"   Blood transfusion    IN 2012  AFTER C -SECTION   Cerebral thrombosis with cerebral infarction ([place]) 06/2009   RIGHT SIDED WEAKNESS ( ARM AND LEG ) AND SPASMS-remains with slight weakness and vertigo.   Constipation    Depression    Diabetes mellitus    Diabetic neuropathy (HCC)    BOTH FEET --COMES AND GOES   Edema, lower extremity    Endometriosis    Fatty liver    GERD (gastroesophageal reflux disease)    with pregnancy   H/O eye surgery    Headache(784.0)    MIGRAINES--NOT REALLY HEADACHE-MORE LIKE PRESSURE SENSATION IN HEAD-FEELS DIZZIY AND  FAINT AS THE PRESSURE RESOLVES   Hernia, incisional, RLQ, s/p lap repair Sep 2013 09/02/2011   History of vertigo 03/22/2018   Hx of migraines 10/19/2011   Hyperlipidemia    Hypertension    Leg pain, right    "like bad Charley horse"   Multiple food allergies    Panic disorder without agoraphobia    Rash    HANDS, ARMS --STATES HX OF RASH [place].  STATES THE RASH OFTEN OCCURS WHEN SHE IS REALLY STRESSED."goes and comes-presently left ring finger"   Restless leg syndrome    DIAGNOSED BY SLEEP STUDY - PT TOLD SHE DID NOT HAVE SLEEP APNEA    Right rotator cuff tear    PAIN IN RIGHT SHOULDER   SBO (small bowel obstruction) ([place]) 06/03/2012   Shortness of breath    Spastic hemiplegia affecting dominant side (HCC)    Stomach problems    Stroke (HCC)    Ventral hernia    RIGHT LOWER QUADRANT-CAUSING SOME PAIN   Weakness of right side of body    Past Surgical History:  Procedure Laterality Date   APPLICATION OF WOUND VAC N/A 05/25/2015   Procedure: APPLICATION OF WOUND VAC;  Surgeon: Michael Boston, MD;  Location: [facility];  Service: General;  Laterality: N/A;   CARDIAC CATHETERIZATION     CESAREAN SECTION  2012   COLONOSCOPY     CORONARY STENT INTERVENTION N/A 09/05/2020   Procedure: CORONARY STENT INTERVENTION;  Surgeon: Nigel Mormon, MD;  Location: [place];  Service: Cardiovascular;  Laterality: N/A;   DIAGNOSTIC LAPAROSCOPY     ESOPHAGOGASTRODUODENOSCOPY N/A 06/03/2012   Procedure: ESOPHAGOGASTRODUODENOSCOPY (EGD);  Surgeon: [name] Craver, MD;  Location: [facility];  Service: Endoscopy;  Laterality: N/A;   EXCISION MASS ABDOMINAL N/A 05/25/2015   Procedure: ABDOMINAL WALL EXPLORATION EXCISION OF SEROMA REMOVAL OF REDUNDANT SKIN ;  Surgeon: Michael Boston, MD;  Location: [facility];  Service: General;  Laterality: N/A;   EYE SURGERY     Eye laser for vessel hemorrhaging   fybroid removal     HERNIA REPAIR  10/03/2011   ventral hernia repair   INSERTION OF MESH N/A 02/04/2013   Procedure: INSERTION OF MESH;  Surgeon: Adin Hector, MD;  Location: [facility];  Service: General;  Laterality: N/A;   INTRAVASCULAR ULTRASOUND/IVUS N/A 09/05/2020   Procedure: Intravascular Ultrasound/IVUS;  Surgeon: Nigel Mormon, MD;  Location: [place] [facility];  Service: Cardiovascular;  Laterality: N/A;   LEFT HEART CATH AND CORONARY ANGIOGRAPHY N/A 09/05/2020   Procedure: LEFT HEART CATH AND CORONARY ANGIOGRAPHY;  Surgeon: Nigel Mormon, MD;  Location: [place] [facility];  Service: Cardiovascular;  Laterality: N/A;    UMBILICAL HERNIA REPAIR N/A 02/04/2013   Procedure: LAPAROSCOPIC ventral wall hernia repair LAPAROSCOPIC LYSIS OF ADHESIONS laparoscopic exploration of abdomen ;  Surgeon: Adin Hector, MD;  Location: [facility];  Service: General;  Laterality: N/A;   UPPER GASTROINTESTINAL ENDOSCOPY     URETER REVISION     Bilateral "twisted"   UTERINE FIBROID SURGERY     2 SURGERIES FOR FIBROIDS   VENTRAL HERNIA REPAIR  10/03/2011   Procedure: LAPAROSCOPIC VENTRAL HERNIA;  Surgeon: Adin Hector, MD;  Location: [facility];  Service: General;  Laterality: N/A;   Patient Active Problem List   Diagnosis Date Noted   Hypertensive emergency 09/09/2020   Chest pain    Elevated troponin    Status post coronary artery stent placement    Non-ST elevation (NSTEMI) myocardial infarction ([place]) 09/05/2020   NSTEMI (non-ST elevated myocardial infarction) ([place]) 09/05/2020   Hypersomnia, persistent 09/04/2020   OSA on CPAP 09/04/2020   Shortness of breath 08/18/2020   Allergy to alpha-gal 08/18/2020   Seasonal and perennial allergic rhinitis 08/18/2020   Allergy with anaphylaxis due to food 08/18/2020   Pruritus 08/18/2020   Pollen-food allergy 08/18/2020   Allergic reaction 08/13/2020   Angioedema 08/13/2020   Type 2 macular telangiectasis, bilateral 01/03/2020   Excessive daytime sleepiness 09/12/2019   Sleep paralysis 09/12/2019   Hypnopompic hallucination 09/12/2019   Abnormal dreams 09/12/2019   Diabetic macular edema of right eye with proliferative retinopathy associated with type 2 diabetes mellitus ([place]) 07/04/2019   Stable treated proliferative diabetic retinopathy of left eye determined by examination associated with type 2 diabetes mellitus ([place]) 07/04/2019   Long-term insulin use ([place]) 04/07/2019   Depression 04/13/2018   History of vertigo 03/22/2018   Laboratory examination 03/22/2018   Chronic diastolic (congestive) heart failure ([place]) 03/22/2018   Other hyperlipidemia 02/15/2018   Vitamin D  deficiency 12/31/2017   Class 2 severe obesity with serious comorbidity and body mass index (BMI) of 39.0 to 39.9 in adult ([place]) 11/18/2017   Gait disturbance, post-stroke 04/23/2017   Strain of gluteus medius 04/23/2017   OSA (obstructive sleep apnea) 10/17/2015   Periodic limb movement disorder (PLMD) 10/17/2015   Essential hypertension 05/26/2015   Diabetes mellitus ([place]) 05/26/2015   Abdominal wall mass of right lower quadrant 05/25/2015   Hemiparesis affecting right side as late effect of stroke ([place]) 11/09/2014   Abdominal wall seroma    Sepsis due to undetermined organism ([place]) 03/06/2014   Nausea vomiting and diarrhea 03/06/2014   Sepsis ([place]) 03/06/2014   Tension headache 09/01/2013   Nausea with vomiting ?Gastroparesis? 03/21/2013   Spastic hemiplegia affecting dominant side ([place]) 02/16/2013   Recurrent ventral incisional hernia  s/p closure/repair w mesh 02/04/2013 01/26/2013   Constipation, chronic 01/26/2013   Abdominal pain, chronic, right lower quadrant 06/03/2012   History of CVA (cerebrovascular accident) 06/03/2012   HTN (hypertension) 06/03/2012   Diabetes mellitus type II, uncontrolled ([place]) 10/16/2011   Obesity (BMI 30-39.9) 09/02/2011   Rotator cuff tear 08/25/2011   Sciatica 06/10/2011   Paresthesias in right hand 06/10/2011   Lumbago 05/09/2011   Paresthesias 05/09/2011    ONSET DATE: 05/30/21  REFERRING DIAG: O53.6U4 (ICD-10-CM) - Vestibular dysfunction, unspecified laterality  THERAPY DIAG:  Dizziness and giddiness  Muscle weakness (generalized)  Unsteadiness on feet  Other abnormalities of gait and mobility  Rationale for Evaluation and Treatment Rehabilitation  SUBJECTIVE:   SUBJECTIVE STATEMENT: Patient reports that she has been feeling a little better. Only taking gabapentin at night now. Still not sleeping well.  Pt accompanied by: self  PERTINENT HISTORY: Anxiety, Angioedema, Chronic Back Pain, CVA with R Sided Weakness, Depression, DM  with Neuropathy, GERD, HA, Hx of Vertigo, Migraines, HLD, HTN, Panic Disorder, Restless Leg, R Rotator Cuff Tear, SOB. Old infarctions affecting the pons, left basal  ganglia and bilateral hemispheric white matter.   PAIN:  Are you having pain? No There were no vitals filed for this visit.  There is no height or weight on file to calculate BMI.  Blood Sugar: 128  PRECAUTIONS: Fall   PLOF: Independent with household mobility without device  PATIENT GOALS Improve the Dizziness; Improve the Balance. Be able to walk more without looking drunk   OBJECTIVE:   VESTIBULAR TREATMENT:  Gaze Adaptation: x1 Viewing Horizontal: Position: Seated, Time: x 30 seconds. Reps: 2, and Comment: able to maintain eyes focused with proper technique, mild dizziness.  x1 Viewing Vertical:  Position: Seated, Time: x 25 seconds, Reps: 2, and Comment: patient reports 6/10 dizziness and nausea.   Habituation: Forward Bending/Reaching to the Floor: completed bending to cones in bilat directions from seated position, x 5 reps bilat with moderate dizziness/nausea. Required seated rest break and then completed additional set x 5 reps bilat. Intermittent rest breaks required due to symptom provocation, but returns to baseline with rest break intermittent. Reviewed handout.     GAIT: Gait pattern: WFL Distance walked: clinic distance with activities Assistive device utilized: None Level of assistance: SBA Comments: More stability noted   Reviewed Balance HEP and educated on form/technique, progression options for head rotation.   Access Code: [email or web address] URL: [email or web address] Date: 07/17/2021 Prepared by: Baldomero Lamy  Exercises - Standing Balance with Eyes Closed  - 1-2 x daily - 5 x weekly - 3 sets - 30 hold - Standing with Head Rotation  - 1-2 x daily - 5 x weekly - 2 sets - 5 reps - Standing with Head Nod  - 1-2 x daily - 5 x weekly - 2 sets - 5 reps  PT having extensive  conversation with patient regarding benefits of PT services. Due to multifactorial dizziness and medically complexity, will plan on going on hold until more medically stable. Pt agreeable.    PATIENT EDUCATION: Education details: HEP Review; Continue to complete HEP while on Hold Person educated: Patient Education method: Explanation Education comprehension: verbalized understanding  HEP: Seated VOR (see pt instructions for more details)  Access Code: [email or web address]   GOALS: Goals reviewed with patient? Yes  SHORT TERM GOALS: Target date: 07/12/2021  Pt will be independent with initial HEP for improved balance and vestibular input  Baseline: no HEP Established Goal status: INITIAL  2.  FGA TBA  and LTG to be set as applicable Baseline: 99/24 Goal status: MET  3.  Pt will report </= 3/5 for all movements on MSQ to indicate improvement in motion sensitivity and improved activity tolerance.   Baseline: 3-4/5 Goal status: INITIAL   LONG TERM GOALS: Target date: 08/02/2021  Pt will be independent with final HEP for improved balance and vestibular  Baseline: no HEP established Goal status: INITIAL  2.  Pt will improve FGA by 4 points from baseline to demonstrate improved balance and reduced fall risk Baseline: 12/30 Goal status: INITIAL  3.  Pt will report </= 2/5 for all movements on MSQ to indicate improvement in motion sensitivity and improved activity tolerance.  Baseline: 3-4/5 Goal status: INITIAL  4.  Pt will improve DVA to </= 2 line difference to indicate improved VOR Baseline: 4 line difference Goal status: INITIAL  5.  LTG to be set for FOTO Baseline: TBA Goal status: INITIAL  ASSESSMENT:  CLINICAL IMPRESSION: Todays session focused on reviewing current HEP and progressing and enduring proper completion. Due to continued multi factorial dizziness, PT and Pt agree to place PT on hold for 3-4 weeks until can become more medically stable in hopes of improvements in  dizziness. Dizziness seems to be multifactorial and unable to determine cause at this time due to multiple components feeding into symptoms. Will continue to assess and treat as indicated.      OBJECTIVE IMPAIRMENTS decreased activity tolerance, decreased balance, decreased endurance, decreased knowledge of use of DME, difficulty walking, decreased strength, dizziness, impaired sensation, and pain.   ACTIVITY LIMITATIONS bending, reach over head, and caring for others  PARTICIPATION LIMITATIONS: cleaning, driving, and community activity  PERSONAL FACTORS Age, Time since onset of injury/illness/exacerbation, and 3+ comorbidities: Anxiety, Angioedema, Chronic Back Pain, CVA with R Sided Weakness, Depression, DM with Neuropathy, GERD, HA, Hx of Vertigo, Migraines, HLD, HTN, Panic Disorder, Restless Leg, R Rotator Cuff Tear, SOB  are also affecting patient's functional outcome.   REHAB POTENTIAL: Good  CLINICAL DECISION MAKING: Evolving/moderate complexity  EVALUATION COMPLEXITY: Moderate   PLAN: PT FREQUENCY: 2x/week  PT DURATION: 6 weeks  PLANNED INTERVENTIONS: Therapeutic exercises, Therapeutic activity, Neuromuscular re-education, Balance training, Gait training, Patient/Family education, Joint manipulation, Joint mobilization, Stair training, Vestibular training, Canalith repositioning, DME instructions, Aquatic Therapy, Dry Needling, Cryotherapy, Moist heat, and Manual therapy  PLAN FOR NEXT SESSION: Will place on hold for four weeks. Monitor BP. Bending/180 deg turns. Head motions, EC, narrow BOS. Progress gaze exercises.    Jones Bales, PT, DPT 07/17/2021, 2:40 PM

## 2021-07-22 ENCOUNTER — Ambulatory Visit (INDEPENDENT_AMBULATORY_CARE_PROVIDER_SITE_OTHER): Payer: Federal, State, Local not specified - PPO | Admitting: Ophthalmology

## 2021-07-22 ENCOUNTER — Encounter (INDEPENDENT_AMBULATORY_CARE_PROVIDER_SITE_OTHER): Payer: Self-pay | Admitting: Ophthalmology

## 2021-07-22 DIAGNOSIS — Z1231 Encounter for screening mammogram for malignant neoplasm of breast: Secondary | ICD-10-CM | POA: Diagnosis not present

## 2021-07-22 DIAGNOSIS — E113511 Type 2 diabetes mellitus with proliferative diabetic retinopathy with macular edema, right eye: Secondary | ICD-10-CM | POA: Diagnosis not present

## 2021-07-22 DIAGNOSIS — Z01419 Encounter for gynecological examination (general) (routine) without abnormal findings: Secondary | ICD-10-CM | POA: Diagnosis not present

## 2021-07-22 DIAGNOSIS — E113552 Type 2 diabetes mellitus with stable proliferative diabetic retinopathy, left eye: Secondary | ICD-10-CM | POA: Diagnosis not present

## 2021-07-22 MED ORDER — AFLIBERCEPT 2MG/0.05ML IZ SOLN FOR KALEIDOSCOPE
2.0000 mg | INTRAVITREAL | Status: AC | PRN
  Administered 2021-07-22: 2 mg via INTRAVITREAL

## 2021-07-22 NOTE — Assessment & Plan Note (Signed)
Stable OS, no active disease

## 2021-07-22 NOTE — Progress Notes (Signed)
07/22/2021     CHIEF COMPLAINT Patient presents for  Chief Complaint  Patient presents with   Diabetic Retinopathy with Macular Edema      HISTORY OF PRESENT ILLNESS: Tina [name] is a 51 y.o. female who presents to the clinic today for:     Referring physician: [name], DO [address] [place],  [place] [address]  HISTORICAL INFORMATION:   Selected notes from the [facility]    Lab Results  Component Value Date   HGBA1C 8.4 (H) 08/13/2020     CURRENT MEDICATIONS: No current outpatient medications on file. (Ophthalmic Drugs)   No current facility-administered medications for this visit. (Ophthalmic Drugs)   Current Outpatient Medications (Other)  Medication Sig   albuterol (VENTOLIN HFA) 108 (90 Base) MCG/ACT inhaler Inhale 2 puffs into the lungs every 4 (four) hours as needed for wheezing or shortness of breath.   allopurinol (ZYLOPRIM) 100 MG tablet Take 1 tablet by mouth daily.   amLODipine (NORVASC) 5 MG tablet Take 1 tablet (5 mg total) by mouth daily.   aspirin EC 81 MG tablet Take 81 mg by mouth at bedtime. Swallow whole.   atorvastatin (LIPITOR) 10 MG tablet    atorvastatin (LIPITOR) 40 MG tablet Take 1 tablet (40 mg total) by mouth at bedtime.   carvedilol (COREG) 25 MG tablet Take 1 tablet by mouth 2 (two) times daily with a meal.   cetirizine (ZYRTEC) 10 MG tablet Take 10 mg by mouth daily as needed for allergies (itching).   cloNIDine (CATAPRES) 0.1 MG tablet    cloNIDine (CATAPRES) 0.2 MG tablet Take 0.4 mg by mouth 2 (two) times daily.   clopidogrel (PLAVIX) 75 MG tablet Take 1 tablet (75 mg total) by mouth daily.   colchicine 0.6 MG tablet Take 0.6 mg by mouth daily.   Continuous Blood Gluc Sensor (FREESTYLE LIBRE 2 SENSOR) MISC Inject 1 Device into the skin every 14 (fourteen) days.   DULoxetine (CYMBALTA) 20 MG capsule Take 20-40 mg by mouth See admin instructions. Take 20 mg by mouth in the morning and 40 mg at  bedtime   empagliflozin (JARDIANCE) 25 MG TABS tablet 1 tablet   EPINEPHrine (AUVI-Q) 0.3 mg/0.3 mL IJ SOAJ injection Inject 0.3 mg into the muscle as needed for anaphylaxis.   famotidine (PEPCID) 20 MG tablet Take 1 tablet by mouth 2 (two) times daily.   furosemide (LASIX) 40 MG tablet Take 40 mg by mouth in the morning.   gabapentin (NEURONTIN) 300 MG capsule Take 300 mg by mouth at bedtime.   hydrALAZINE (APRESOLINE) 10 MG tablet Take 1 tablet (10 mg total) by mouth 3 (three) times daily.   Insulin Aspart FlexPen (NOVOLOG) 100 UNIT/ML Inject 0-25 Units into the skin 3 (three) times daily before meals. Dose per sliding scale.   Insulin Glargine (BASAGLAR KWIKPEN) 100 UNIT/ML Inject 22 Units into the skin at bedtime. (Patient taking differently: Inject 18 Units into the skin at bedtime.)   isosorbide mononitrate (IMDUR) 60 MG 24 hr tablet TAKE 1 TABLET BY MOUTH DAILY   JARDIANCE 10 MG TABS tablet Take 5-10 mg by mouth daily.   ketoconazole (NIZORAL) 2 % cream APPLY CREAM TOPICALLY TO AFFECTED AREA ONCE DAILY   naproxen (NAPROSYN) 500 MG tablet Take 1 tablet by mouth 2 (two) times daily.   oxyCODONE-acetaminophen (PERCOCET/ROXICET) 5-325 MG tablet Take 1 tablet every 6 hours by oral route for 5 days. (Patient not taking: Reported on 07/11/2021)   [place], 2  MG/DOSE, 8 MG/3ML SOPN Inject 2 mg into the skin once a week.   Semaglutide, 1 MG/DOSE, (OZEMPIC, 1 MG/DOSE,) 4 MG/3ML SOPN    Semaglutide, 2 MG/DOSE, (OZEMPIC, 2 MG/DOSE,) 8 MG/3ML SOPN 2 mg subcutaneous once weekly 28 days   sertraline (ZOLOFT) 100 MG tablet    spironolactone (ALDACTONE) 25 MG tablet Take 25 mg by mouth daily.   tiZANidine (ZANAFLEX) 2 MG tablet Take 1 tablet (2 mg total) by mouth at bedtime.   traMADol (ULTRAM) 50 MG tablet Take 1 tablet by mouth 2 (two) times daily.   Vitamin D, Cholecalciferol, 25 MCG (1000 UT) CAPS 1 capsule   VITAMIN D, ERGOCALCIFEROL, PO    No current facility-administered medications for this  visit. (Other)      REVIEW OF SYSTEMS: ROS   Negative for: Constitutional, Gastrointestinal, Neurological, Skin, Genitourinary, Musculoskeletal, HENT, Endocrine, Cardiovascular, Eyes, Respiratory, Psychiatric, Allergic/Imm, Heme/Lymph Last edited by Hurman Horn, MD on 07/22/2021 10:08 AM.       ALLERGIES Allergies  Allergen Reactions   Amlodipine Benzoate Swelling and Other (See Comments)    Leg swelling Other reaction(s): leg swelling Other reaction(s): ? leg swelling at 10 mg dose Other reaction(s): ? leg swelling at 10 mg dose Other reaction(s): ? leg swelling at 10 mg dose   Contrast Media [Iodinated Contrast Media] Shortness Of Breath and Other (See Comments)    Difficulty breathing   Iodine Anaphylaxis    Other reaction(s): Unknown Other reaction(s): Unknown Other reaction(s): Unknown Other reaction(s): Unknown   Iohexol Hives, Nausea And Vomiting, Swelling and Other (See Comments)     Desc: Magnevist-gadolinium-difficulty breathing, throat swelling    Midazolam Hcl Anaphylaxis    Difficulty breathing   Other Shortness Of Breath, Itching and Other (See Comments)    Patient is allergic to all nuts except peanuts, which are NOT "nuts"- they are legumes   Shellfish Allergy Anaphylaxis   Shellfish-Derived Products Anaphylaxis and Other (See Comments)    Other reaction(s): Unknown   Valsartan Swelling    Other reaction(s): Unknown Other reaction(s): Unknown Other reaction(s): angioedema Other reaction(s): angioedema   Metformin And Related Diarrhea, Nausea And Vomiting and Other (See Comments)    Dehydration, also   [place] [Dapagliflozin] Nausea Only and Other (See Comments)    UTI, headaches and muscle aches, also   Hydralazine Other (See Comments)    Reaction not recalled Other reaction(s): Unknown Other reaction(s): Unknown Other reaction(s): Unknown Other reaction(s): Unknown   Saxagliptin Hives, Nausea And Vomiting and Other (See Comments)    ONGLYZA-  dehydration, also Other reaction(s): Unknown Other reaction(s): Unknown Other reaction(s): Unknown Other reaction(s): Unknown   Spironolactone Other (See Comments)    Elevated potassium- Severe hyperkalemia Other reaction(s): severe hyperkalemia Other reaction(s): severe hyperkalemia Other reaction(s): severe hyperkalemia   Avandia [Rosiglitazone Maleate] Hives and Other (See Comments)   Geodon [Ziprasidone] Other (See Comments)    Reaction not recalled   Kiwi Extract Itching and Swelling   Latex Itching    Other reaction(s): Unknown Other reaction(s): Unknown    PAST MEDICAL HISTORY Past Medical History:  Diagnosis Date   Allergy    Anemia    DURING MENSES--HAS HEAVY BLEEDING WITH PERODS   Angioedema 08/13/2020   Anxiety    Back pain, chronic    "ongoing"   Blood transfusion    IN 2012  AFTER C -SECTION   Cerebral thrombosis with cerebral infarction ([facility]) 06/2009   RIGHT SIDED WEAKNESS ( ARM AND LEG ) AND SPASMS-remains with slight weakness  and vertigo.   Constipation    Depression    Diabetes mellitus    Diabetic neuropathy (HCC)    BOTH FEET --COMES AND GOES   Edema, lower extremity    Endometriosis    Fatty liver    GERD (gastroesophageal reflux disease)    with pregnancy   H/O eye surgery    Headache(784.0)    MIGRAINES--NOT REALLY HEADACHE-MORE LIKE PRESSURE SENSATION IN HEAD-FEELS DIZZIY AND  FAINT AS THE PRESSURE RESOLVES   Hernia, incisional, RLQ, s/p lap repair Sep 2013 09/02/2011   History of vertigo 03/22/2018   Hx of migraines 10/19/2011   Hyperlipidemia    Hypertension    Leg pain, right    "like bad Charley horse"   Multiple food allergies    Panic disorder without agoraphobia    Rash    HANDS, ARMS --STATES HX OF RASH [place].  STATES THE RASH OFTEN OCCURS WHEN SHE IS REALLY STRESSED."goes and comes-presently left ring finger"   Restless leg syndrome    DIAGNOSED BY SLEEP STUDY - PT TOLD SHE DID NOT HAVE SLEEP APNEA    Right rotator cuff tear    PAIN IN RIGHT SHOULDER   SBO (small bowel obstruction) ([facility] [facility]) 06/03/2012   Shortness of breath    Spastic hemiplegia affecting dominant side (HCC)    Stomach problems    Stroke (HCC)    Ventral hernia    RIGHT LOWER QUADRANT-CAUSING SOME PAIN   Weakness of right side of body    Past Surgical History:  Procedure Laterality Date   APPLICATION OF WOUND VAC N/A 05/25/2015   Procedure: APPLICATION OF WOUND VAC;  Surgeon: Michael Boston, MD;  Location: [facility];  Service: General;  Laterality: N/A;   CARDIAC CATHETERIZATION     CESAREAN SECTION  2012   COLONOSCOPY     CORONARY STENT INTERVENTION N/A 09/05/2020   Procedure: CORONARY STENT INTERVENTION;  Surgeon: Nigel Mormon, MD;  Location: [facility];  Service: Cardiovascular;  Laterality: N/A;   DIAGNOSTIC LAPAROSCOPY     ESOPHAGOGASTRODUODENOSCOPY N/A 06/03/2012   Procedure: ESOPHAGOGASTRODUODENOSCOPY (EGD);  Surgeon: [name] Craver, MD;  Location[facility];  Service: Endoscopy;  Laterality: N/A;   EXCISION MASS ABDOMINAL N/A 05/25/2015   Procedure: ABDOMINAL WALL EXPLORATION EXCISION OF SEROMA REMOVAL OF REDUNDANT SKIN ;  Surgeon: Michael Boston, MD;  Location: [facility];  Service: General;  Laterality: N/A;   EYE SURGERY     Eye laser for vessel hemorrhaging   fybroid removal     HERNIA REPAIR  10/03/2011   ventral hernia repair   INSERTION OF MESH N/A 02/04/2013   Procedure: INSERTION OF MESH;  Surgeon: Adin Hector, MD;  Location: [facility];  Service: General;  Laterality: N/A;   INTRAVASCULAR ULTRASOUND/IVUS N/A 09/05/2020   Procedure: Intravascular Ultrasound/IVUS;  Surgeon: Nigel Mormon, MD;  Location: [facility];  Service: Cardiovascular;  Laterality: N/A;   LEFT HEART CATH AND CORONARY ANGIOGRAPHY N/A 09/05/2020   Procedure: LEFT HEART CATH AND CORONARY ANGIOGRAPHY;  Surgeon: Nigel Mormon, MD;  Location: [facility];  Service: Cardiovascular;  Laterality: N/A;    UMBILICAL HERNIA REPAIR N/A 02/04/2013   Procedure: LAPAROSCOPIC ventral wall hernia repair LAPAROSCOPIC LYSIS OF ADHESIONS laparoscopic exploration of abdomen ;  Surgeon: Adin Hector, MD;  Location: [facility];  Service: General;  Laterality: N/A;   UPPER GASTROINTESTINAL ENDOSCOPY     URETER REVISION     Bilateral "twisted"   UTERINE FIBROID SURGERY  2 SURGERIES FOR FIBROIDS   VENTRAL HERNIA REPAIR  10/03/2011   Procedure: LAPAROSCOPIC VENTRAL HERNIA;  Surgeon: Adin Hector, MD;  Location: [facility];  Service: General;  Laterality: N/A;    FAMILY HISTORY Family History  Problem Relation Age of Onset   Diabetes Father    Kidney disease Father    Depression Father    Drug abuse Father    Allergic rhinitis Mother    Eczema Mother    Urticaria Mother    Depression Mother    Anxiety disorder Mother    Bipolar disorder Mother    Alcoholism Mother    Drug abuse Mother    Eating disorder Mother    Diabetes Maternal Grandmother    Hyperlipidemia Paternal Grandmother    Stroke Paternal Grandmother    Eczema Sister    Urticaria Sister    Colon cancer Paternal Uncle    Other Neg Hx    Angioedema Neg Hx    Asthma Neg Hx    Colon polyps Neg Hx    Esophageal cancer Neg Hx    Rectal cancer Neg Hx    Stomach cancer Neg Hx     SOCIAL HISTORY Social History   Tobacco Use   Smoking status: Never   Smokeless tobacco: Never  Vaping Use   Vaping Use: Never used  Substance Use Topics   Alcohol use: No    Alcohol/week: 0.0 standard drinks of alcohol   Drug use: No         OPHTHALMIC EXAM:  Base Eye Exam     Visual Acuity (ETDRS)       Right Left   Dist [place] 20/25 20/20         Pupils       Pupils APD   Right PERRL None   Left PERRL None         Visual Fields       Left Right    Full Full         Extraocular Movement       Right Left    Full, Ortho Full, Ortho         Neuro/Psych     Oriented x3: Yes   Mood/Affect: Normal          Dilation     Both eyes: 1.0% Mydriacyl, 2.5% Phenylephrine @ 10:10 AM           Slit Lamp and Fundus Exam     External Exam       Right Left   External Normal Normal         Slit Lamp Exam       Right Left   Lids/Lashes Normal Normal   Conjunctiva/Sclera White and quiet White and quiet   Cornea Clear Clear   Anterior Chamber Deep and quiet Deep and quiet   Iris Round and reactive Round and reactive   Lens Centered Posterior chamber intraocular lens, 2+ Posterior capsular opacification Posterior chamber intraocular lens   Anterior Vitreous Normal Normal         Fundus Exam       Right Left   Posterior Vitreous Vitrectomized Vitrectomized   Disc Normal Normal   C/D Ratio 0.2 0.1   Macula Microaneurysms, no exudates, Macular thickening, Moderate clinically significant macular edema no exudates, no macular thickening, no clinically significant macular edema, Microaneurysms   Vessels PDR-quiet PDR-quiet   Periphery Good PRP, a attached Good PRP, a attached  IMAGING AND PROCEDURES  Imaging and Procedures for 07/22/21  OCT, Retina - OU - Both Eyes       Right Eye Quality was good. Scan locations included subfoveal. Central Foveal Thickness: 440. Progression has been stable. Findings include abnormal foveal contour, cystoid macular edema.   Left Eye Quality was good. Scan locations included subfoveal. Central Foveal Thickness: 351. Progression has been stable. Findings include normal foveal contour.   Notes Temporal thickening worsening post Avastin, now with center involved CSME OD, repeat vegF OD today with Eylea post minor improvement             Intravitreal Injection, Pharmacologic Agent - OD - Right Eye       Time Out 07/22/2021. 11:04 AM. Confirmed correct patient, procedure, site, and patient consented.   Anesthesia Topical anesthesia was used. Anesthetic medications included Lidocaine 4%.   Procedure Preparation included 5%  betadine to ocular surface, 10% betadine to eyelids. A 30 gauge needle was used.   Injection: 2 mg aflibercept 2 MG/0.05ML   Route: Intravitreal, Site: Right Eye   NDC: [id number], Lot: [id number], Expiration date: 04/14/2022, Waste: 0 mL   Post-op Post injection exam found visual acuity of at least counting fingers. The patient tolerated the procedure well. There were no complications. The patient received written and verbal post procedure care education. Post injection medications included ocuflox.              ASSESSMENT/PLAN:  Stable treated proliferative diabetic retinopathy of left eye determined by examination associated with type 2 diabetes mellitus (HCC) Stable OS, no active disease  Diabetic macular edema of right eye with proliferative retinopathy associated with type 2 diabetes mellitus ([place]) Center involved CSME with only minimal improvement on Avastin.  Multiple injections.  Repeat treatment today with Eylea OD today     ICD-10-CM   1. Diabetic macular edema of right eye with proliferative retinopathy associated with type 2 diabetes mellitus (HCC)  E11.3511 OCT, Retina - OU - Both Eyes    Intravitreal Injection, Pharmacologic Agent - OD - Right Eye    aflibercept (EYLEA) SOLN 2 mg    2. Stable treated proliferative diabetic retinopathy of left eye determined by examination associated with type 2 diabetes mellitus ([place])  K02.5427 OCT, Retina - OU - Both Eyes      1.  OD with CSME minimally improved and center involved post Avastin multiple injections.  We will repeat injection today using Eylea OD to preserve and protect visual acuity  2.  3.  Ophthalmic Meds Ordered this visit:  Meds ordered this encounter  Medications   aflibercept (EYLEA) SOLN 2 mg       Return in about 5 weeks (around 08/26/2021) for dilate, OD, EYLEA OCT.  There are no Patient Instructions on file for this visit.   Explained the diagnoses, plan, and follow up with the patient and  they expressed understanding.  Patient expressed understanding of the importance of proper follow up care.   Clent Demark Lizandra Zakrzewski M.D. Diseases & Surgery of the Retina and Vitreous Retina & Diabetic [place] 07/22/21     Abbreviations: M myopia (nearsighted); A astigmatism; H hyperopia (farsighted); P presbyopia; Mrx spectacle prescription;  CTL contact lenses; OD right eye; OS left eye; OU both eyes  XT exotropia; ET esotropia; PEK punctate epithelial keratitis; PEE punctate epithelial erosions; DES dry eye syndrome; MGD meibomian gland dysfunction; ATs artificial tears; PFAT's preservative free artificial tears; [place] nuclear sclerotic cataract; PSC posterior subcapsular cataract; ERM epi-retinal membrane;  PVD posterior vitreous detachment; RD retinal detachment; DM diabetes mellitus; DR diabetic retinopathy; NPDR non-proliferative diabetic retinopathy; PDR proliferative diabetic retinopathy; CSME clinically significant macular edema; DME diabetic macular edema; dbh dot blot hemorrhages; CWS cotton wool spot; POAG primary open angle glaucoma; C/D cup-to-disc ratio; HVF humphrey visual field; GVF goldmann visual field; OCT optical coherence tomography; IOP intraocular pressure; BRVO Branch retinal vein occlusion; CRVO central retinal vein occlusion; CRAO central retinal artery occlusion; BRAO branch retinal artery occlusion; RT retinal tear; SB scleral buckle; PPV pars plana vitrectomy; VH Vitreous hemorrhage; PRP panretinal laser photocoagulation; IVK intravitreal kenalog; VMT vitreomacular traction; MH Macular hole;  NVD neovascularization of the disc; NVE neovascularization elsewhere; AREDS age related eye disease study; ARMD age related macular degeneration; POAG primary open angle glaucoma; EBMD epithelial/anterior basement membrane dystrophy; ACIOL anterior chamber intraocular lens; IOL intraocular lens; PCIOL posterior chamber intraocular lens; Phaco/IOL phacoemulsification with intraocular lens  placement; [place] photorefractive keratectomy; LASIK laser assisted in situ keratomileusis; HTN hypertension; DM diabetes mellitus; COPD chronic obstructive pulmonary disease

## 2021-07-22 NOTE — Assessment & Plan Note (Signed)
Center involved CSME with only minimal improvement on Avastin.  Multiple injections.  Repeat treatment today with Eylea OD today

## 2021-07-26 ENCOUNTER — Encounter: Payer: Federal, State, Local not specified - PPO | Admitting: Physical Therapy

## 2021-08-02 ENCOUNTER — Encounter: Payer: Self-pay | Admitting: Gastroenterology

## 2021-08-02 DIAGNOSIS — E1169 Type 2 diabetes mellitus with other specified complication: Secondary | ICD-10-CM | POA: Diagnosis not present

## 2021-08-02 DIAGNOSIS — E1165 Type 2 diabetes mellitus with hyperglycemia: Secondary | ICD-10-CM | POA: Diagnosis not present

## 2021-08-02 DIAGNOSIS — E1122 Type 2 diabetes mellitus with diabetic chronic kidney disease: Secondary | ICD-10-CM | POA: Diagnosis not present

## 2021-08-03 DIAGNOSIS — R102 Pelvic and perineal pain: Secondary | ICD-10-CM | POA: Diagnosis not present

## 2021-08-07 ENCOUNTER — Other Ambulatory Visit (HOSPITAL_BASED_OUTPATIENT_CLINIC_OR_DEPARTMENT_OTHER): Payer: Self-pay

## 2021-08-07 MED ORDER — OZEMPIC (2 MG/DOSE) 8 MG/3ML ~~LOC~~ SOPN
PEN_INJECTOR | SUBCUTANEOUS | 2 refills | Status: DC
  Filled 2021-08-07 – 2021-08-09 (×2): qty 3, 28d supply, fill #0

## 2021-08-07 MED ORDER — OZEMPIC (2 MG/DOSE) 8 MG/3ML ~~LOC~~ SOPN
PEN_INJECTOR | SUBCUTANEOUS | 2 refills | Status: DC
  Filled 2021-08-07 – 2021-08-30 (×2): qty 3, 28d supply, fill #0

## 2021-08-09 ENCOUNTER — Other Ambulatory Visit (HOSPITAL_COMMUNITY): Payer: Self-pay

## 2021-08-09 ENCOUNTER — Telehealth: Payer: Self-pay

## 2021-08-09 ENCOUNTER — Other Ambulatory Visit (HOSPITAL_BASED_OUTPATIENT_CLINIC_OR_DEPARTMENT_OTHER): Payer: Self-pay

## 2021-08-12 DIAGNOSIS — G4700 Insomnia, unspecified: Secondary | ICD-10-CM | POA: Diagnosis not present

## 2021-08-12 DIAGNOSIS — G4733 Obstructive sleep apnea (adult) (pediatric): Secondary | ICD-10-CM | POA: Diagnosis not present

## 2021-08-12 DIAGNOSIS — E1122 Type 2 diabetes mellitus with diabetic chronic kidney disease: Secondary | ICD-10-CM | POA: Diagnosis not present

## 2021-08-12 DIAGNOSIS — I10 Essential (primary) hypertension: Secondary | ICD-10-CM | POA: Diagnosis not present

## 2021-08-12 DIAGNOSIS — R102 Pelvic and perineal pain: Secondary | ICD-10-CM | POA: Diagnosis not present

## 2021-08-13 NOTE — Telephone Encounter (Signed)
Called the patient but had to leave a voicemail.   Dr. Egan Berkheimer

## 2021-08-16 DIAGNOSIS — R102 Pelvic and perineal pain: Secondary | ICD-10-CM | POA: Diagnosis not present

## 2021-08-21 ENCOUNTER — Encounter (INDEPENDENT_AMBULATORY_CARE_PROVIDER_SITE_OTHER): Payer: Self-pay

## 2021-08-21 DIAGNOSIS — E118 Type 2 diabetes mellitus with unspecified complications: Secondary | ICD-10-CM | POA: Diagnosis not present

## 2021-08-21 DIAGNOSIS — E785 Hyperlipidemia, unspecified: Secondary | ICD-10-CM | POA: Diagnosis not present

## 2021-08-21 DIAGNOSIS — I10 Essential (primary) hypertension: Secondary | ICD-10-CM | POA: Diagnosis not present

## 2021-08-21 DIAGNOSIS — E1122 Type 2 diabetes mellitus with diabetic chronic kidney disease: Secondary | ICD-10-CM | POA: Diagnosis not present

## 2021-08-21 DIAGNOSIS — R109 Unspecified abdominal pain: Secondary | ICD-10-CM | POA: Diagnosis not present

## 2021-08-22 ENCOUNTER — Ambulatory Visit: Payer: PPO | Admitting: Cardiology

## 2021-08-22 ENCOUNTER — Encounter: Payer: Self-pay | Admitting: Cardiology

## 2021-08-22 VITALS — BP 128/76 | HR 70 | Temp 96.0°F | Resp 16 | Ht 66.0 in | Wt 216.0 lb

## 2021-08-22 DIAGNOSIS — Z794 Long term (current) use of insulin: Secondary | ICD-10-CM | POA: Diagnosis not present

## 2021-08-22 DIAGNOSIS — I10 Essential (primary) hypertension: Secondary | ICD-10-CM

## 2021-08-22 DIAGNOSIS — E1140 Type 2 diabetes mellitus with diabetic neuropathy, unspecified: Secondary | ICD-10-CM

## 2021-08-22 DIAGNOSIS — Z955 Presence of coronary angioplasty implant and graft: Secondary | ICD-10-CM

## 2021-08-22 DIAGNOSIS — I6930 Unspecified sequelae of cerebral infarction: Secondary | ICD-10-CM | POA: Diagnosis not present

## 2021-08-22 DIAGNOSIS — I951 Orthostatic hypotension: Secondary | ICD-10-CM

## 2021-08-22 DIAGNOSIS — I2510 Atherosclerotic heart disease of native coronary artery without angina pectoris: Secondary | ICD-10-CM | POA: Diagnosis not present

## 2021-08-22 NOTE — Progress Notes (Signed)
Date:  08/23/2021   ID:  Tina [name], DOB 1970/07/08, MRN [id number]  PCP:  Tina Morning, DO  Cardiologist:  Rex Kras, DO, [facility] (established care 04/07/2019) Former cardiology providers: Jeri Lager, APRN, FNP-C  Date: 08/22/21 Last Office Visit: 02/18/2021  Chief Complaint  Patient presents with   Coronary Artery Disease   Follow-up    6 month   Dizziness    HPI  Tina [name] is a 51 y.o. African-American female whose past medical history and cardiovascular risk factors include: OSA on CPAP, hypertension, insulin-dependent diabetes mellitus, h/o stroke 2011, obesity, NSTEMI treated with OM2 PCI 09/05/2020, hyperlipidemia, obesity due to excess calories.  Was admitted in April 2022 for chest pain and ruled in for non-STEMI.  She was noted to have obstructive CAD and underwent intervention to the OM 2 distribution and placed on dual antiplatelet therapy.  Since last office visit patient has not had any anginal discomfort.  Her shortness of breath with effort related activities are improving.  She does not have heart failure symptoms.  She does experience lightheaded and dizziness with changing positions.  She has lost approximately 26 pounds since last office visit, 6 months ago.  FUNCTIONAL STATUS: She works out approximately 15 minutes at home.  ALLERGIES: Allergies  Allergen Reactions   Amlodipine Benzoate Swelling and Other (See Comments)    Leg swelling Other reaction(s): leg swelling Other reaction(s): ? leg swelling at 10 mg dose Other reaction(s): ? leg swelling at 10 mg dose Other reaction(s): ? leg swelling at 10 mg dose   Contrast Media [Iodinated Contrast Media] Shortness Of Breath and Other (See Comments)    Difficulty breathing   Iodine Anaphylaxis    Other reaction(s): Unknown Other reaction(s): Unknown Other reaction(s): Unknown Other reaction(s): Unknown   Iohexol Hives, Nausea And Vomiting, Swelling and Other (See Comments)     Desc:  Magnevist-gadolinium-difficulty breathing, throat swelling    Midazolam Hcl Anaphylaxis    Difficulty breathing   Other Shortness Of Breath, Itching and Other (See Comments)    Patient is allergic to all nuts except peanuts, which are NOT "nuts"- they are legumes   Shellfish Allergy Anaphylaxis   Shellfish-Derived Products Anaphylaxis and Other (See Comments)    Other reaction(s): Unknown   Valsartan Swelling    Other reaction(s): Unknown Other reaction(s): Unknown Other reaction(s): angioedema Other reaction(s): angioedema   Metformin And Related Diarrhea, Nausea And Vomiting and Other (See Comments)    Dehydration, also   [place] [Dapagliflozin] Nausea Only and Other (See Comments)    UTI, headaches and muscle aches, also   Hydralazine Other (See Comments)    Reaction not recalled Other reaction(s): Unknown Other reaction(s): Unknown Other reaction(s): Unknown Other reaction(s): Unknown   Saxagliptin Hives, Nausea And Vomiting and Other (See Comments)    ONGLYZA- dehydration, also Other reaction(s): Unknown Other reaction(s): Unknown Other reaction(s): Unknown Other reaction(s): Unknown   Spironolactone Other (See Comments)    Elevated potassium- Severe hyperkalemia Other reaction(s): severe hyperkalemia Other reaction(s): severe hyperkalemia Other reaction(s): severe hyperkalemia   Avandia [Rosiglitazone Maleate] Hives and Other (See Comments)   Geodon [Ziprasidone] Other (See Comments)    Reaction not recalled   Kiwi Extract Itching and Swelling   Latex Itching    Other reaction(s): Unknown Other reaction(s): Unknown    MEDICATION LIST PRIOR TO VISIT: Current Meds  Medication Sig   albuterol (VENTOLIN HFA) 108 (90 Base) MCG/ACT inhaler Inhale 2 puffs into the lungs every 4 (four) hours as needed for  wheezing or shortness of breath.   allopurinol (ZYLOPRIM) 100 MG tablet Take 1 tablet by mouth daily.   amLODipine (NORVASC) 5 MG tablet Take 1 tablet (5 mg total) by  mouth daily.   aspirin EC 81 MG tablet Take 81 mg by mouth at bedtime. Swallow whole.   carvedilol (COREG) 25 MG tablet Take 1 tablet by mouth 2 (two) times daily with a meal.   cetirizine (ZYRTEC) 10 MG tablet Take 10 mg by mouth daily as needed for allergies (itching).   clopidogrel (PLAVIX) 75 MG tablet Take 1 tablet (75 mg total) by mouth daily.   colchicine 0.6 MG tablet Take 0.6 mg by mouth daily.   DULoxetine (CYMBALTA) 20 MG capsule Take 20-40 mg by mouth See admin instructions. Take 20 mg by mouth in the [name] and 40 mg at bedtime   empagliflozin (JARDIANCE) 25 MG TABS tablet 1 tablet   EPINEPHrine (AUVI-Q) 0.3 mg/0.3 mL IJ SOAJ injection Inject 0.3 mg into the muscle as needed for anaphylaxis.   famotidine (PEPCID) 20 MG tablet Take 1 tablet by mouth 2 (two) times daily.   furosemide (LASIX) 40 MG tablet Take 40 mg by mouth in the [name].   gabapentin (NEURONTIN) 300 MG capsule Take 300 mg by mouth at bedtime.   hydrALAZINE (APRESOLINE) 10 MG tablet Take 1 tablet (10 mg total) by mouth 3 (three) times daily.   Insulin Aspart FlexPen (NOVOLOG) 100 UNIT/ML Inject 0-25 Units into the skin 3 (three) times daily before meals. Dose per sliding scale.   Insulin Glargine (BASAGLAR KWIKPEN) 100 UNIT/ML Inject 22 Units into the skin at bedtime. (Patient taking differently: Inject 18 Units into the skin at bedtime.)   isosorbide mononitrate (IMDUR) 60 MG 24 hr tablet TAKE 1 TABLET BY MOUTH DAILY   JARDIANCE 10 MG TABS tablet Take 5-10 mg by mouth daily.   OZEMPIC, 2 MG/DOSE, 8 MG/3ML SOPN Inject 2 mg into the skin once a week.   Semaglutide, 1 MG/DOSE, (OZEMPIC, 1 MG/DOSE,) 4 MG/3ML SOPN    Semaglutide, 2 MG/DOSE, (OZEMPIC, 2 MG/DOSE,) 8 MG/3ML SOPN 2 mg subcutaneous once weekly 28 days   Semaglutide, 2 MG/DOSE, (OZEMPIC, 2 MG/DOSE,) 8 MG/3ML SOPN Inject 2 mg subcutaneous once weekly 84 days   Semaglutide, 2 MG/DOSE, (OZEMPIC, 2 MG/DOSE,) 8 MG/3ML SOPN 2 mg subcutaneous once weekly 84 days    spironolactone (ALDACTONE) 25 MG tablet Take 25 mg by mouth daily.   tiZANidine (ZANAFLEX) 2 MG tablet Take 1 tablet (2 mg total) by mouth at bedtime.   traMADol (ULTRAM) 50 MG tablet Take 1 tablet by mouth 2 (two) times daily.   Vitamin D, Cholecalciferol, 25 MCG (1000 UT) CAPS 1 capsule   VITAMIN D, ERGOCALCIFEROL, PO    [DISCONTINUED] atorvastatin (LIPITOR) 10 MG tablet    [DISCONTINUED] cloNIDine (CATAPRES) 0.2 MG tablet Take 0.4 mg by mouth 2 (two) times daily.     PAST MEDICAL HISTORY: Past Medical History:  Diagnosis Date   Allergy    Anemia    DURING MENSES--HAS HEAVY BLEEDING WITH PERODS   Angioedema 08/13/2020   Anxiety    Back pain, chronic    "ongoing"   Blood transfusion    IN 2012  AFTER C -SECTION   Cerebral thrombosis with cerebral infarction ([place]) 06/2009   RIGHT SIDED WEAKNESS ( ARM AND LEG ) AND SPASMS-remains with slight weakness and vertigo.   Constipation    Depression    Diabetes mellitus    Diabetic neuropathy (HCC)  BOTH FEET --COMES AND GOES   Edema, lower extremity    Endometriosis    Fatty liver    GERD (gastroesophageal reflux disease)    with pregnancy   H/O eye surgery    Headache(784.0)    MIGRAINES--NOT REALLY HEADACHE-MORE LIKE PRESSURE SENSATION IN HEAD-FEELS DIZZIY AND  FAINT AS THE PRESSURE RESOLVES   Hernia, incisional, RLQ, s/p lap repair Sep 2013 09/02/2011   History of vertigo 03/22/2018   Hx of migraines 10/19/2011   Hyperlipidemia    Hypertension    Leg pain, right    "like bad Charley horse"   Multiple food allergies    Panic disorder without agoraphobia    Rash    HANDS, ARMS --STATES HX OF RASH EVER SINCE CHILDBIRTH/PREGNANCY.  STATES THE RASH OFTEN OCCURS WHEN SHE IS REALLY STRESSED."goes and comes-presently left ring finger"   Restless leg syndrome    DIAGNOSED BY SLEEP STUDY - PT TOLD SHE DID NOT HAVE SLEEP APNEA   Right rotator cuff tear    PAIN IN RIGHT SHOULDER   SBO (small bowel obstruction) ([place]) 06/03/2012    Shortness of breath    Spastic hemiplegia affecting dominant side (HCC)    Stomach problems    Stroke (HCC)    Ventral hernia    RIGHT LOWER QUADRANT-CAUSING SOME PAIN   Weakness of right side of body     PAST SURGICAL HISTORY: Past Surgical History:  Procedure Laterality Date   APPLICATION OF WOUND VAC N/A 05/25/2015   Procedure: APPLICATION OF WOUND VAC;  Surgeon: Michael Boston, MD;  Location: [facility];  Service: General;  Laterality: N/A;   CARDIAC CATHETERIZATION     CESAREAN SECTION  2012   COLONOSCOPY     CORONARY STENT INTERVENTION N/A 09/05/2020   Procedure: CORONARY STENT INTERVENTION;  Surgeon: Nigel Mormon, MD;  Location: [place] [facility];  Service: Cardiovascular;  Laterality: N/A;   DIAGNOSTIC LAPAROSCOPY     ESOPHAGOGASTRODUODENOSCOPY N/A 06/03/2012   Procedure: ESOPHAGOGASTRODUODENOSCOPY (EGD);  Surgeon: [name] Craver, MD;  [facility];  Service: Endoscopy;  Laterality: N/A;   EXCISION MASS ABDOMINAL N/A 05/25/2015   Procedure: ABDOMINAL WALL EXPLORATION EXCISION OF SEROMA REMOVAL OF REDUNDANT SKIN ;  Surgeon: Michael Boston, MD;  Location: [facility];  Service: General;  Laterality: N/A;   EYE SURGERY     Eye laser for vessel hemorrhaging   fybroid removal     HERNIA REPAIR  10/03/2011   ventral hernia repair   INSERTION OF MESH N/A 02/04/2013   Procedure: INSERTION OF MESH;  Surgeon: Adin Hector, MD;  Location: [facility];  Service: General;  Laterality: N/A;   INTRAVASCULAR ULTRASOUND/IVUS N/A 09/05/2020   Procedure: Intravascular Ultrasound/IVUS;  Surgeon: Nigel Mormon, MD;  Location: [place] [facility];  Service: Cardiovascular;  Laterality: N/A;   LEFT HEART CATH AND CORONARY ANGIOGRAPHY N/A 09/05/2020   Procedure: LEFT HEART CATH AND CORONARY ANGIOGRAPHY;  Surgeon: Nigel Mormon, MD;  Location: [place] [facility];  Service: Cardiovascular;  Laterality: N/A;   UMBILICAL HERNIA REPAIR N/A 02/04/2013   Procedure: LAPAROSCOPIC ventral  wall hernia repair LAPAROSCOPIC LYSIS OF ADHESIONS laparoscopic exploration of abdomen ;  Surgeon: Adin Hector, MD;  Location: [facility];  Service: General;  Laterality: N/A;   UPPER GASTROINTESTINAL ENDOSCOPY     URETER REVISION     Bilateral "twisted"   UTERINE FIBROID SURGERY     2 SURGERIES FOR FIBROIDS   VENTRAL HERNIA REPAIR  10/03/2011   Procedure: LAPAROSCOPIC  VENTRAL HERNIA;  Surgeon: Adin Hector, MD;  Location: [facility];  Service: General;  Laterality: N/A;    FAMILY HISTORY: The patient family history includes Alcoholism in her mother; Allergic rhinitis in her mother; Anxiety disorder in her mother; Bipolar disorder in her mother; Colon cancer in her paternal uncle; Depression in her father and mother; Diabetes in her father and maternal grandmother; Drug abuse in her father and mother; Eating disorder in her mother; Eczema in her mother and sister; Hyperlipidemia in her paternal grandmother; Kidney disease in her father; Stroke in her paternal grandmother; Urticaria in her mother and sister.  SOCIAL HISTORY:  The patient  reports that she has never smoked. She has never used smokeless tobacco. She reports that she does not drink alcohol and does not use drugs.  REVIEW OF SYSTEMS: Review of Systems  Constitutional: Positive for weight loss. Negative for chills, fever and weight gain.  HENT:  Negative for hoarse voice and nosebleeds.   Eyes:  Negative for discharge, double vision and pain.  Cardiovascular:  Positive for dyspnea on exertion (chronic, improved). Negative for chest pain, claudication, leg swelling, near-syncope, orthopnea, palpitations, paroxysmal nocturnal dyspnea and syncope.  Respiratory:  Positive for shortness of breath (Chronic, improving). Negative for hemoptysis.   Musculoskeletal:  Negative for muscle cramps and myalgias.  Gastrointestinal:  Positive for nausea. Negative for abdominal pain, constipation, diarrhea, hematemesis, hematochezia, melena and  vomiting.  Neurological:  Positive for dizziness. Negative for light-headedness.    PHYSICAL EXAM:    08/22/2021   10:47 AM 07/11/2021   11:14 AM 07/10/2021    1:25 PM  Vitals with BMI  Height '5\' 6"'  '5\' 6"'    Weight 216 lbs 223 lbs 13 oz   BMI 30.16 01.09   Systolic 323 557 96  Diastolic 76 70 62  Pulse 70 79 82   Laying down: 128/76, pulse of 70. Sitting 110/66, pulse of 82. Standing 89/58, pulse of 84  CONSTITUTIONAL: Well-developed and well-nourished. No acute distress.  SKIN: Skin is warm and dry. No rash noted. No cyanosis. No pallor. No jaundice HEAD: Normocephalic and atraumatic.  EYES: No scleral icterus MOUTH/THROAT: Moist oral membranes.  NECK: No JVD present. No thyromegaly noted. No carotid bruits  CHEST Normal respiratory effort. No intercostal retractions  LUNGS: Clear to auscultation bilaterally.  No stridor. No wheezes. No rales.  CARDIOVASCULAR: Regular rate and rhythm, positive S1-S2, no murmurs rubs or gallops appreciated. ABDOMINAL: Obese, soft, nontender, nondistended, positive bowel sounds in all 4 quadrants, no apparent ascites.  EXTREMITIES: No peripheral edema, warm to touch. HEMATOLOGIC: No significant bruising NEUROLOGIC: Oriented to person, place, and time. Nonfocal. Normal muscle tone.  PSYCHIATRIC: Normal mood and affect. Normal behavior. Cooperative  CARDIAC DATABASE: EKG: 08/22/2021: NSR, 72 bpm, without underlying injury pattern, nonspecific T wave abnormality.   Echocardiogram: 09/06/2020:  1. Left ventricular ejection fraction, by estimation, is 60 to 65%. The left ventricle has normal function. The left ventricle has no regional wall motion abnormalities. There is mild left ventricular hypertrophy. Left ventricular diastolic parameters  are indeterminate. Elevated left atrial pressure.   2. Right ventricular systolic function is normal. The right ventricular size is normal.   3. The mitral valve is grossly normal. No evidence of mitral valve  regurgitation. No evidence of mitral stenosis.   4. The aortic valve is tricuspid. Aortic valve regurgitation is not visualized. Mild aortic valve sclerosis is present, with no evidence of aortic valve stenosis.   5. The inferior vena cava is normal  in size with greater than 50% respiratory variability, suggesting right atrial pressure of 3 mmHg.   Stress Testing: Lexiscan myoview stress test 08/21/2017:  1. Lexiscan stress test was performed. Exercise capacity was not assessed. Resting BP 148/90 mmHg, peak effect BP 168/90 mmHg. Stress symptoms included dizziness, nausea, headache, chest tightness.  2. The overall quality of the study is good. There is no evidence of abnormal lung activity. Stress and rest SPECT images demonstrate homogeneous tracer distribution throughout the myocardium. Gated SPECT imaging reveals normal myocardial thickening and wall motion. The left ventricular ejection fraction was normal calculated as 45%, although visually appears normal.  3. Low risk study.  Heart Catheterization: Coronary intervention 09/05/2020: LM: Normal LAD: Distal apical 70% stenosis (non-culprit, very small caliber) Lcx: Mid Lcx 30% disease with just after a small OM1 branch (non-culprit)        Prox OM2 with 95% stenosis (culprit) RCA: Small caliber PDA with diffuse 60% disease LVEF 50-55% with apical inferolateral hypokinesis   Successful percutaneous coronary intervention OM2        PTCA and stent placement 2.25 X 12 mm Onyx drug-eluting stent        IVUS guided Post dilatation using 2.25 mm [place] at 22 atm and 2.5 mm [place] at 16 atm  ABI 08/21/2017: This exam reveals normal perfusion of both the  lower extremity (RABI 1.27 and LABI 1.20 with biphasic waveform).  ABI may be falsely elevated in patients with DM and medial calcinosis.  LABORATORY DATA:    Latest Ref Rng & Units 06/05/2021   11:13 AM 12/14/2020   10:05 AM 09/11/2020   12:20 AM  CBC  WBC 4.0 - 10.5 K/uL 8.8  9.4  5.7    Hemoglobin 12.0 - 15.0 g/dL 14.5  15.1  11.2   Hematocrit 36.0 - 46.0 % 43.8  46.9  34.3   Platelets 150 - 400 K/uL 342  371  299        Latest Ref Rng & Units 06/05/2021   11:13 AM 12/14/2020   10:05 AM 11/19/2020    4:35 PM  CMP  Glucose 70 - 99 mg/dL 103  241  200   BUN 6 - 20 mg/dL '9  19  19   ' Creatinine 0.44 - 1.00 mg/dL 1.06  1.39  1.07   Sodium 135 - 145 mmol/L 140  142  140   Potassium 3.5 - 5.1 mmol/L 3.7  3.9  4.2   Chloride 98 - 111 mmol/L 107  108  102   CO2 22 - 32 mmol/L '24  18  27   ' Calcium 8.9 - 10.3 mg/dL 8.9  9.5  9.0   Total Protein 6.5 - 8.1 g/dL 6.8  8.3    Total Bilirubin 0.3 - 1.2 mg/dL 0.9  1.4    Alkaline Phos 38 - 126 U/L 49  60    AST 15 - 41 U/L 18  19    ALT 0 - 44 U/L 15  16      Lipid Panel  Lab Results  Component Value Date   CHOL 175 09/06/2020   HDL 56 09/06/2020   LDLCALC 92 09/06/2020   TRIG 137 09/06/2020   CHOLHDL 3.1 09/06/2020     No components found for: "NTPROBNP" Recent Labs    11/19/20 1635  PROBNP 228   No results for input(s): "TSH" in the last 8760 hours.  BMP Recent Labs    09/11/20 0020 11/19/20 1635 12/14/20 1005 06/05/21 1113  NA 136  140 142 140  K 3.9 4.2 3.9 3.7  CL 101 102 108 107  CO2 27 27 18* 24  GLUCOSE 190* 200* 241* 103*  BUN '16 19 19 9  ' CREATININE 1.26* 1.07* 1.39* 1.06*  CALCIUM 8.5* 9.0 9.5 8.9  GFRNONAA 52*  --  46* >60    HEMOGLOBIN A1C Lab Results  Component Value Date   HGBA1C 8.4 (H) 08/13/2020   MPG 194.38 08/13/2020    Lipid Panel   2021-04-02    Cholesterol 151   <200  Cholesterol / HDL Ratio 3.60   0.00-4.44  HDL Cholesterol 42   >39  LDL Cholesterol (Calculation) 62   <130  LDL/HDL Ratio 1.5   <3.3  Non-HDL Cholesterol 109   <130  Triglycerides 235   <150  Microalbumin/Creatinine, Random Urine Sample   2021-04-02    Albumin/Creatinine Ratio, Urine 2598   0-30  Creatinine, Urine 72.8      Microalbumin, Urine, Random 189.1      Comprehensive Metabolic Panel RS  In-house   2021-04-02    Albumin 3.5   3.4-5.0  Albumin/Globulin Ratio 0.9   1.1-2.5  Alkaline Phosphatase 68   25-150  ALT (SGPT) 25   <6-78  AST (SGOT) 19   0-40  Bilirubin, Total 0.6   0.2-1.0  BUN 13   7-18  BUN/Creatinine Ratio 11.2   11.0-26.0  Calcium 9.5   8.5-10.1  Chloride 103   98-107  CO2 30   21-32  Creatinine 1.16   0.55-1.02  GFR/Black 63   >59  GFR/White 54   >59  Globulin, Calculated 3.9   1.5-4.6  Glucose 143   74-106  Potassium 4.0   3.5-5.1  Protein 7.4   6.4-8.2  Sodium 141   136-145    IMPRESSION:    ICD-10-CM   1. Atherosclerosis of native coronary artery of native heart without angina pectoris  I25.10 EKG 12-Lead    2. Hx of heart artery stent  Z95.5     3. Essential hypertension  I10     4. Orthostatic hypotension  I95.1     5. History of CVA with residual deficit  I69.30     6. Type 2 diabetes mellitus with diabetic neuropathy, with long-term current use of insulin (HCC)  E11.40    Z79.4     7. Long-term insulin use (HCC)  Z79.4         RECOMMENDATIONS: Reneisha MIKAEL DEBELL is a 51 y.o. African-American female whose past medical history and cardiac risk factors include: OSA on CPAP, hypertension, insulin-dependent diabetes mellitus, h/o stroke 2011, obesity, NSTEMI treated with OM2 PCI 09/05/2020, hyperlipidemia, obesity due to excess calories.  Atherosclerosis of native coronary artery of native heart without angina pectoris/history of non-STEMI/PTCA and PCI of OM 2: Denies angina pectoris. No use of sublingual nitroglycerin tablets since last office visit. Continue  DAPT until 09/01/2021 and longer if able due to the size the stent (per interventional cardiology) Continue with statin therapy, LDL is <30m/dL as of March 2023. TAG needs to be better controlled.  She is lost 26 pounds since last office visit -she is congratulated on the efforts.    Essential hypertension /orthostatic hypotension Office blood pressures are within acceptable  limits. Given her symptoms of lightheaded/dizziness with changing position orthostatic vital signs were performed which were positive for orthostasis. Recommended weaning off of clonidine. She is currently taking clonidine 0.2 mg 2 tablets twice a day.  For the next 3 days I recommended her to  take clonidine 0.2 mg 1 tablet twice a day.  After that reduce the clonidine to 0.2 mg p.o. daily for the following 3 days.  After that reduce the clonidine to 0.2 mg  p.o. every other day for 3 days.  And then stop. During this time she is asked to keep a log of her blood pressures and to call if any other questions or concerns arise. Patient is currently getting her tablets in a pill package form.  And therefore we will discontinue the clonidine prescription so that it is not refilled in the new packages  History of CVA with residual deficit Educated on importance of secondary prevention. Medications reconciled.  Type 2 diabetes mellitus with diabetic neuropathy, with long-term current use of insulin (HCC) Currently not on ACE inhibitor/ARB due to swelling?  Angioedema. Was on Farxiga-now listed as one of her allergies/intolerances. Continue statin therapy. LDL currently at goal. Triglycerides needs to be better controlled -encourage lifestyle changes..  Educated on importance of improving physical activity to goal of 30 minutes a day 5 days a week as tolerated.  Total time spent: 30 minutes.   FINAL MEDICATION LIST END OF ENCOUNTER: No orders of the defined types were placed in this encounter.   Medications Discontinued During This Encounter  Medication Reason   sertraline (ZOLOFT) 100 MG tablet    oxyCODONE-acetaminophen (PERCOCET/ROXICET) 5-325 MG tablet    naproxen (NAPROSYN) 500 MG tablet    ketoconazole (NIZORAL) 2 % cream    cloNIDine (CATAPRES) 0.1 MG tablet    atorvastatin (LIPITOR) 10 MG tablet Duplicate   cloNIDine (CATAPRES) 0.2 MG tablet Discontinued by provider      Current Outpatient Medications:    albuterol (VENTOLIN HFA) 108 (90 Base) MCG/ACT inhaler, Inhale 2 puffs into the lungs every 4 (four) hours as needed for wheezing or shortness of breath., Disp: 18 g, Rfl: 1   allopurinol (ZYLOPRIM) 100 MG tablet, Take 1 tablet by mouth daily., Disp: , Rfl:    amLODipine (NORVASC) 5 MG tablet, Take 1 tablet (5 mg total) by mouth daily., Disp: 90 tablet, Rfl: 0   aspirin EC 81 MG tablet, Take 81 mg by mouth at bedtime. Swallow whole., Disp: , Rfl:    carvedilol (COREG) 25 MG tablet, Take 1 tablet by mouth 2 (two) times daily with a meal., Disp: , Rfl:    cetirizine (ZYRTEC) 10 MG tablet, Take 10 mg by mouth daily as needed for allergies (itching)., Disp: , Rfl:    clopidogrel (PLAVIX) 75 MG tablet, Take 1 tablet (75 mg total) by mouth daily., Disp: 90 tablet, Rfl: 3   colchicine 0.6 MG tablet, Take 0.6 mg by mouth daily., Disp: , Rfl:    DULoxetine (CYMBALTA) 20 MG capsule, Take 20-40 mg by mouth See admin instructions. Take 20 mg by mouth in the [name] and 40 mg at bedtime, Disp: , Rfl:    empagliflozin (JARDIANCE) 25 MG TABS tablet, 1 tablet, Disp: , Rfl:    EPINEPHrine (AUVI-Q) 0.3 mg/0.3 mL IJ SOAJ injection, Inject 0.3 mg into the muscle as needed for anaphylaxis., Disp: 1 each, Rfl: 1   famotidine (PEPCID) 20 MG tablet, Take 1 tablet by mouth 2 (two) times daily., Disp: , Rfl:    furosemide (LASIX) 40 MG tablet, Take 40 mg by mouth in the [name]., Disp: , Rfl:    gabapentin (NEURONTIN) 300 MG capsule, Take 300 mg by mouth at bedtime., Disp: , Rfl:    hydrALAZINE (APRESOLINE) 10 MG tablet, Take 1 tablet (10  mg total) by mouth 3 (three) times daily., Disp: 180 tablet, Rfl: 3   Insulin Aspart FlexPen (NOVOLOG) 100 UNIT/ML, Inject 0-25 Units into the skin 3 (three) times daily before meals. Dose per sliding scale., Disp: , Rfl:    Insulin Glargine (BASAGLAR KWIKPEN) 100 UNIT/ML, Inject 22 Units into the skin at bedtime. (Patient taking differently: Inject 18  Units into the skin at bedtime.), Disp: , Rfl:    isosorbide mononitrate (IMDUR) 60 MG 24 hr tablet, TAKE 1 TABLET BY MOUTH DAILY, Disp: 30 tablet, Rfl: 10   JARDIANCE 10 MG TABS tablet, Take 5-10 mg by mouth daily., Disp: , Rfl:    OZEMPIC, 2 MG/DOSE, 8 MG/3ML SOPN, Inject 2 mg into the skin once a week., Disp: , Rfl:    Semaglutide, 1 MG/DOSE, (OZEMPIC, 1 MG/DOSE,) 4 MG/3ML SOPN, , Disp: , Rfl:    Semaglutide, 2 MG/DOSE, (OZEMPIC, 2 MG/DOSE,) 8 MG/3ML SOPN, 2 mg subcutaneous once weekly 28 days, Disp: 3 mL, Rfl: 3   Semaglutide, 2 MG/DOSE, (OZEMPIC, 2 MG/DOSE,) 8 MG/3ML SOPN, Inject 2 mg subcutaneous once weekly 84 days, Disp: 9 mL, Rfl: 2   Semaglutide, 2 MG/DOSE, (OZEMPIC, 2 MG/DOSE,) 8 MG/3ML SOPN, 2 mg subcutaneous once weekly 84 days, Disp: 9 mL, Rfl: 2   spironolactone (ALDACTONE) 25 MG tablet, Take 25 mg by mouth daily., Disp: , Rfl:    tiZANidine (ZANAFLEX) 2 MG tablet, Take 1 tablet (2 mg total) by mouth at bedtime., Disp: 90 tablet, Rfl: 1   traMADol (ULTRAM) 50 MG tablet, Take 1 tablet by mouth 2 (two) times daily., Disp: , Rfl:    Vitamin D, Cholecalciferol, 25 MCG (1000 UT) CAPS, 1 capsule, Disp: , Rfl:    VITAMIN D, ERGOCALCIFEROL, PO, , Disp: , Rfl:    atorvastatin (LIPITOR) 40 MG tablet, Take 1 tablet (40 mg total) by mouth at bedtime., Disp: 90 tablet, Rfl: 0   Continuous Blood Gluc Sensor (FREESTYLE LIBRE 2 SENSOR) MISC, Inject 1 Device into the skin every 14 (fourteen) days., Disp: , Rfl:   Orders Placed This Encounter  Procedures   EKG 12-Lead   There are no Patient Instructions on file for this visit.   --Continue cardiac medications as reconciled in final medication list. --Return in about 6 months (around 02/22/2022) for Follow up, CAD. Or sooner if needed. --Continue follow-up with your primary care physician regarding the management of your other chronic comorbid conditions.  Patient's questions and concerns were addressed to her satisfaction. She voices  understanding of the instructions provided during this encounter.   This note was created using a voice recognition software as a result there may be grammatical errors inadvertently enclosed that do not reflect the nature of this encounter. Every attempt is made to correct such errors.  Rex Kras, [place], [facility]  Pager: [phone] Office: [phone]

## 2021-08-23 NOTE — Telephone Encounter (Signed)
Patient came into the office for an appt

## 2021-08-26 ENCOUNTER — Ambulatory Visit (INDEPENDENT_AMBULATORY_CARE_PROVIDER_SITE_OTHER): Payer: Federal, State, Local not specified - PPO | Admitting: Ophthalmology

## 2021-08-26 ENCOUNTER — Encounter (INDEPENDENT_AMBULATORY_CARE_PROVIDER_SITE_OTHER): Payer: Self-pay | Admitting: Ophthalmology

## 2021-08-26 DIAGNOSIS — E113552 Type 2 diabetes mellitus with stable proliferative diabetic retinopathy, left eye: Secondary | ICD-10-CM | POA: Diagnosis not present

## 2021-08-26 DIAGNOSIS — E113511 Type 2 diabetes mellitus with proliferative diabetic retinopathy with macular edema, right eye: Secondary | ICD-10-CM | POA: Diagnosis not present

## 2021-08-26 MED ORDER — AFLIBERCEPT 2MG/0.05ML IZ SOLN FOR KALEIDOSCOPE
2.0000 mg | INTRAVITREAL | Status: AC | PRN
  Administered 2021-08-26: 2 mg via INTRAVITREAL

## 2021-08-26 NOTE — Assessment & Plan Note (Signed)
Persistent CSME yet improved post change from Avastin to [facility]  At 5-week interval today.  Follow-up again in 5 weeks repeat after repeat Eylea today

## 2021-08-26 NOTE — Progress Notes (Signed)
08/26/2021     CHIEF COMPLAINT Patient presents for  Chief Complaint  Patient presents with   Diabetic Retinopathy with Macular Edema      HISTORY OF PRESENT ILLNESS: Tina [name] is a 51 y.o. female who presents to the clinic today for:   HPI   5 weeks for DILATE OD, EYLEA OCT. Pt stated vision has gotten slightly worse in the right eye. Pt reports vision has gotten blurry since last visit.  Last edited by Silvestre Moment on 08/26/2021  9:58 AM.      Referring physician: [name], DO [address] [place],  [place] [address]  HISTORICAL INFORMATION:   Selected notes from the MEDICAL RECORD NUMBER    Lab Results  Component Value Date   HGBA1C 8.4 (H) 08/13/2020     CURRENT MEDICATIONS: No current outpatient medications on file. (Ophthalmic Drugs)   No current facility-administered medications for this visit. (Ophthalmic Drugs)   Current Outpatient Medications (Other)  Medication Sig   albuterol (VENTOLIN HFA) 108 (90 Base) MCG/ACT inhaler Inhale 2 puffs into the lungs every 4 (four) hours as needed for wheezing or shortness of breath.   allopurinol (ZYLOPRIM) 100 MG tablet Take 1 tablet by mouth daily.   amLODipine (NORVASC) 5 MG tablet Take 1 tablet (5 mg total) by mouth daily.   aspirin EC 81 MG tablet Take 81 mg by mouth at bedtime. Swallow whole.   atorvastatin (LIPITOR) 40 MG tablet Take 1 tablet (40 mg total) by mouth at bedtime.   carvedilol (COREG) 25 MG tablet Take 1 tablet by mouth 2 (two) times daily with a meal.   cetirizine (ZYRTEC) 10 MG tablet Take 10 mg by mouth daily as needed for allergies (itching).   clopidogrel (PLAVIX) 75 MG tablet Take 1 tablet (75 mg total) by mouth daily.   colchicine 0.6 MG tablet Take 0.6 mg by mouth daily.   Continuous Blood Gluc Sensor (FREESTYLE LIBRE 2 SENSOR) MISC Inject 1 Device into the skin every 14 (fourteen) days.   DULoxetine (CYMBALTA) 20 MG capsule Take 20-40 mg by mouth See admin  instructions. Take 20 mg by mouth in the morning and 40 mg at bedtime   empagliflozin (JARDIANCE) 25 MG TABS tablet 1 tablet   EPINEPHrine (AUVI-Q) 0.3 mg/0.3 mL IJ SOAJ injection Inject 0.3 mg into the muscle as needed for anaphylaxis.   famotidine (PEPCID) 20 MG tablet Take 1 tablet by mouth 2 (two) times daily.   furosemide (LASIX) 40 MG tablet Take 40 mg by mouth in the morning.   gabapentin (NEURONTIN) 300 MG capsule Take 300 mg by mouth at bedtime.   hydrALAZINE (APRESOLINE) 10 MG tablet Take 1 tablet (10 mg total) by mouth 3 (three) times daily.   Insulin Aspart FlexPen (NOVOLOG) 100 UNIT/ML Inject 0-25 Units into the skin 3 (three) times daily before meals. Dose per sliding scale.   Insulin Glargine (BASAGLAR KWIKPEN) 100 UNIT/ML Inject 22 Units into the skin at bedtime. (Patient taking differently: Inject 18 Units into the skin at bedtime.)   isosorbide mononitrate (IMDUR) 60 MG 24 hr tablet TAKE 1 TABLET BY MOUTH DAILY   JARDIANCE 10 MG TABS tablet Take 5-10 mg by mouth daily.   OZEMPIC, 2 MG/DOSE, 8 MG/3ML SOPN Inject 2 mg into the skin once a week.   Semaglutide, 1 MG/DOSE, (OZEMPIC, 1 MG/DOSE,) 4 MG/3ML SOPN    Semaglutide, 2 MG/DOSE, (OZEMPIC, 2 MG/DOSE,) 8 MG/3ML SOPN 2 mg subcutaneous once weekly 28 days  Semaglutide, 2 MG/DOSE, (OZEMPIC, 2 MG/DOSE,) 8 MG/3ML SOPN Inject 2 mg subcutaneous once weekly 84 days   Semaglutide, 2 MG/DOSE, (OZEMPIC, 2 MG/DOSE,) 8 MG/3ML SOPN 2 mg subcutaneous once weekly 84 days   spironolactone (ALDACTONE) 25 MG tablet Take 25 mg by mouth daily.   tiZANidine (ZANAFLEX) 2 MG tablet Take 1 tablet (2 mg total) by mouth at bedtime.   traMADol (ULTRAM) 50 MG tablet Take 1 tablet by mouth 2 (two) times daily.   Vitamin D, Cholecalciferol, 25 MCG (1000 UT) CAPS 1 capsule   VITAMIN D, ERGOCALCIFEROL, PO    No current facility-administered medications for this visit. (Other)      REVIEW OF SYSTEMS: ROS   Negative for: Constitutional, Gastrointestinal,  Neurological, Skin, Genitourinary, Musculoskeletal, HENT, Endocrine, Cardiovascular, Eyes, Respiratory, Psychiatric, Allergic/Imm, Heme/Lymph Last edited by Silvestre Moment on 08/26/2021  9:58 AM.       ALLERGIES Allergies  Allergen Reactions   Amlodipine Benzoate Swelling and Other (See Comments)    Leg swelling Other reaction(s): leg swelling Other reaction(s): ? leg swelling at 10 mg dose Other reaction(s): ? leg swelling at 10 mg dose Other reaction(s): ? leg swelling at 10 mg dose   Contrast Media [Iodinated Contrast Media] Shortness Of Breath and Other (See Comments)    Difficulty breathing   Iodine Anaphylaxis    Other reaction(s): Unknown Other reaction(s): Unknown Other reaction(s): Unknown Other reaction(s): Unknown   Iohexol Hives, Nausea And Vomiting, Swelling and Other (See Comments)     Desc: Magnevist-gadolinium-difficulty breathing, throat swelling    Midazolam Hcl Anaphylaxis    Difficulty breathing   Other Shortness Of Breath, Itching and Other (See Comments)    Patient is allergic to all nuts except peanuts, which are NOT "nuts"- they are legumes   Shellfish Allergy Anaphylaxis   Shellfish-Derived Products Anaphylaxis and Other (See Comments)    Other reaction(s): Unknown   Valsartan Swelling    Other reaction(s): Unknown Other reaction(s): Unknown Other reaction(s): angioedema Other reaction(s): angioedema   Metformin And Related Diarrhea, Nausea And Vomiting and Other (See Comments)    Dehydration, also   [place] [Dapagliflozin] Nausea Only and Other (See Comments)    UTI, headaches and muscle aches, also   Hydralazine Other (See Comments)    Reaction not recalled Other reaction(s): Unknown Other reaction(s): Unknown Other reaction(s): Unknown Other reaction(s): Unknown   Saxagliptin Hives, Nausea And Vomiting and Other (See Comments)    ONGLYZA- dehydration, also Other reaction(s): Unknown Other reaction(s): Unknown Other reaction(s): Unknown Other  reaction(s): Unknown   Spironolactone Other (See Comments)    Elevated potassium- Severe hyperkalemia Other reaction(s): severe hyperkalemia Other reaction(s): severe hyperkalemia Other reaction(s): severe hyperkalemia   Avandia [Rosiglitazone Maleate] Hives and Other (See Comments)   Geodon [Ziprasidone] Other (See Comments)    Reaction not recalled   Kiwi Extract Itching and Swelling   Latex Itching    Other reaction(s): Unknown Other reaction(s): Unknown    PAST MEDICAL HISTORY Past Medical History:  Diagnosis Date   Allergy    Anemia    DURING MENSES--HAS HEAVY BLEEDING WITH PERODS   Angioedema 08/13/2020   Anxiety    Back pain, chronic    "ongoing"   Blood transfusion    IN 2012  AFTER C -SECTION   Cerebral thrombosis with cerebral infarction ([place]) 06/2009   RIGHT SIDED WEAKNESS ( ARM AND LEG ) AND SPASMS-remains with slight weakness and vertigo.   Constipation    Depression    Diabetes mellitus  Diabetic neuropathy (HCC)    BOTH FEET --COMES AND GOES   Edema, lower extremity    Endometriosis    Fatty liver    GERD (gastroesophageal reflux disease)    with pregnancy   H/O eye surgery    Headache(784.0)    MIGRAINES--NOT REALLY HEADACHE-MORE LIKE PRESSURE SENSATION IN HEAD-FEELS DIZZIY AND  FAINT AS THE PRESSURE RESOLVES   Hernia, incisional, RLQ, s/p lap repair Sep 2013 09/02/2011   History of vertigo 03/22/2018   Hx of migraines 10/19/2011   Hyperlipidemia    Hypertension    Leg pain, right    "like bad Charley horse"   Multiple food allergies    Panic disorder without agoraphobia    Rash    HANDS, ARMS --STATES HX OF RASH EVER SINCE CHILDBIRTH/PREGNANCY.  STATES THE RASH OFTEN OCCURS WHEN SHE IS REALLY STRESSED."goes and comes-presently left ring finger"   Restless leg syndrome    DIAGNOSED BY SLEEP STUDY - PT TOLD SHE DID NOT HAVE SLEEP APNEA   Right rotator cuff tear    PAIN IN RIGHT SHOULDER   SBO (small bowel obstruction) ([place]) 06/03/2012    Shortness of breath    Spastic hemiplegia affecting dominant side (HCC)    Stomach problems    Stroke (HCC)    Ventral hernia    RIGHT LOWER QUADRANT-CAUSING SOME PAIN   Weakness of right side of body    Past Surgical History:  Procedure Laterality Date   APPLICATION OF WOUND VAC N/A 05/25/2015   Procedure: APPLICATION OF WOUND VAC;  Surgeon: Michael Boston, MD;  Location: [facility];  Service: General;  Laterality: N/A;   CARDIAC CATHETERIZATION     CESAREAN SECTION  2012   COLONOSCOPY     CORONARY STENT INTERVENTION N/A 09/05/2020   Procedure: CORONARY STENT INTERVENTION;  Surgeon: Nigel Mormon, MD;  Location: [place] [facility];  Service: Cardiovascular;  Laterality: N/A;   DIAGNOSTIC LAPAROSCOPY     ESOPHAGOGASTRODUODENOSCOPY N/A 06/03/2012   Procedure: ESOPHAGOGASTRODUODENOSCOPY (EGD);  Surgeon: [name] Craver, MD;  Location: [facility]COPY;  Service: Endoscopy;  Laterality: N/A;   EXCISION MASS ABDOMINAL N/A 05/25/2015   Procedure: ABDOMINAL WALL EXPLORATION EXCISION OF SEROMA REMOVAL OF REDUNDANT SKIN ;  Surgeon: Michael Boston, MD;  Location: [facility];  Service: General;  Laterality: N/A;   EYE SURGERY     Eye laser for vessel hemorrhaging   fybroid removal     HERNIA REPAIR  10/03/2011   ventral hernia repair   INSERTION OF MESH N/A 02/04/2013   Procedure: INSERTION OF MESH;  Surgeon: Adin Hector, MD;  Location: [facility];  Service: General;  Laterality: N/A;   INTRAVASCULAR ULTRASOUND/IVUS N/A 09/05/2020   Procedure: Intravascular Ultrasound/IVUS;  Surgeon: Nigel Mormon, MD;  Location: [place] [facility];  Service: Cardiovascular;  Laterality: N/A;   LEFT HEART CATH AND CORONARY ANGIOGRAPHY N/A 09/05/2020   Procedure: LEFT HEART CATH AND CORONARY ANGIOGRAPHY;  Surgeon: Nigel Mormon, MD;  Location: [place] [facility];  Service: Cardiovascular;  Laterality: N/A;   UMBILICAL HERNIA REPAIR N/A 02/04/2013   Procedure: LAPAROSCOPIC ventral wall hernia repair  LAPAROSCOPIC LYSIS OF ADHESIONS laparoscopic exploration of abdomen ;  Surgeon: Adin Hector, MD;  Location: [facility];  Service: General;  Laterality: N/A;   UPPER GASTROINTESTINAL ENDOSCOPY     URETER REVISION     Bilateral "twisted"   UTERINE FIBROID SURGERY     2 SURGERIES FOR FIBROIDS   VENTRAL HERNIA REPAIR  10/03/2011  Procedure: LAPAROSCOPIC VENTRAL HERNIA;  Surgeon: Adin Hector, MD;  Location: [facility];  Service: General;  Laterality: N/A;    FAMILY HISTORY Family History  Problem Relation Age of Onset   Diabetes Father    Kidney disease Father    Depression Father    Drug abuse Father    Allergic rhinitis Mother    Eczema Mother    Urticaria Mother    Depression Mother    Anxiety disorder Mother    Bipolar disorder Mother    Alcoholism Mother    Drug abuse Mother    Eating disorder Mother    Diabetes Maternal Grandmother    Hyperlipidemia Paternal Grandmother    Stroke Paternal Grandmother    Eczema Sister    Urticaria Sister    Colon cancer Paternal Uncle    Other Neg Hx    Angioedema Neg Hx    Asthma Neg Hx    Colon polyps Neg Hx    Esophageal cancer Neg Hx    Rectal cancer Neg Hx    Stomach cancer Neg Hx     SOCIAL HISTORY Social History   Tobacco Use   Smoking status: Never   Smokeless tobacco: Never  Vaping Use   Vaping Use: Never used  Substance Use Topics   Alcohol use: No    Alcohol/week: 0.0 standard drinks of alcohol   Drug use: No         OPHTHALMIC EXAM:  Base Eye Exam     Visual Acuity (ETDRS)       Right Left   Dist [place] 20/25 20/20 -2         Tonometry (Tonopen, 10:01 AM)       Right Left   Pressure 14 13         Pupils       Pupils APD   Right PERRL None   Left PERRL None         Visual Fields       Left Right    Full Full         Extraocular Movement       Right Left    Full, Ortho Full, Ortho         Neuro/Psych     Oriented x3: Yes   Mood/Affect: Normal         Dilation      Right eye: 2.5% Phenylephrine, 1.0% Mydriacyl @ 10:01 AM           Slit Lamp and Fundus Exam     External Exam       Right Left   External Normal Normal         Slit Lamp Exam       Right Left   Lids/Lashes Normal Normal   Conjunctiva/Sclera White and quiet White and quiet   Cornea Clear Clear   Anterior Chamber Deep and quiet Deep and quiet   Iris Round and reactive Round and reactive   Lens Centered Posterior chamber intraocular lens, 2+ Posterior capsular opacification Posterior chamber intraocular lens   Anterior Vitreous Normal Normal         Fundus Exam       Right Left   Posterior Vitreous Vitrectomized    Disc Normal    C/D Ratio 0.2    Macula Microaneurysms, no exudates, Macular thickening, Moderate clinically significant macular edema    Vessels PDR-quiet    Periphery Good PRP,  attached  IMAGING AND PROCEDURES  Imaging and Procedures for 08/26/21  OCT, Retina - OU - Both Eyes       Right Eye Quality was good. Scan locations included subfoveal. Central Foveal Thickness: 367. Progression has been stable. Findings include abnormal foveal contour, cystoid macular edema.   Left Eye Quality was good. Scan locations included subfoveal. Central Foveal Thickness: 346. Progression has been stable. Findings include normal foveal contour.   Notes Temporal thickening worsening post Avastin and now improved post injection Eylea, now with center involved CSME OD, repeat vegF OD today with Eylea post minor improvement             Intravitreal Injection, Pharmacologic Agent - OD - Right Eye       Time Out 08/26/2021. 10:40 AM. Confirmed correct patient, procedure, site, and patient consented.   Anesthesia Topical anesthesia was used. Anesthetic medications included Lidocaine 4%.   Procedure Preparation included 5% betadine to ocular surface, 10% betadine to eyelids. A 30 gauge needle was used.   Injection: 2 mg aflibercept 2  MG/0.05ML   Route: Intravitreal, Site: Right Eye   NDC: [id number], Lot: [id number], Expiration date: 09/15/2022, Waste: 0 mL   Post-op Post injection exam found visual acuity of at least counting fingers. The patient tolerated the procedure well. There were no complications. The patient received written and verbal post procedure care education. Post injection medications included ocuflox.              ASSESSMENT/PLAN:  Diabetic macular edema of right eye with proliferative retinopathy associated with type 2 diabetes mellitus ([place]) Persistent CSME yet improved post change from Avastin to [facility].  At 5-week interval today.  Follow-up again in 5 weeks repeat after repeat Eylea today  Stable treated proliferative diabetic retinopathy of left eye determined by examination associated with type 2 diabetes mellitus ([place]) Quiescent PDR OS, stable     ICD-10-CM   1. Diabetic macular edema of right eye with proliferative retinopathy associated with type 2 diabetes mellitus (HCC)  E11.3511 OCT, Retina - OU - Both Eyes    Intravitreal Injection, Pharmacologic Agent - OD - Right Eye    aflibercept (EYLEA) SOLN 2 mg    2. Stable treated proliferative diabetic retinopathy of left eye determined by examination associated with type 2 diabetes mellitus ([place])  U38.4536       1.  OD, with center involved CSME now improving on Eylea.  Repeat injection today reevaluate next in 6 weeks OD.  2.  Stable PDR.  3.  Ophthalmic Meds Ordered this visit:  Meds ordered this encounter  Medications   aflibercept (EYLEA) SOLN 2 mg       Return in about 6 weeks (around 10/07/2021) for OD, dilate, EYLEA OCT.  There are no Patient Instructions on file for this visit.   Explained the diagnoses, plan, and follow up with the patient and they expressed understanding.  Patient expressed understanding of the importance of proper follow up care.   Clent Demark Skylee Baird M.D. Diseases & Surgery of the Retina and  Vitreous Retina & Diabetic [place] 08/26/21     Abbreviations: M myopia (nearsighted); A astigmatism; H hyperopia (farsighted); P presbyopia; Mrx spectacle prescription;  CTL contact lenses; OD right eye; OS left eye; OU both eyes  XT exotropia; ET esotropia; PEK punctate epithelial keratitis; PEE punctate epithelial erosions; DES dry eye syndrome; MGD meibomian gland dysfunction; ATs artificial tears; PFAT's preservative free artificial tears; [place] nuclear sclerotic cataract; PSC posterior subcapsular cataract; ERM epi-retinal membrane;  PVD posterior vitreous detachment; RD retinal detachment; DM diabetes mellitus; DR diabetic retinopathy; NPDR non-proliferative diabetic retinopathy; PDR proliferative diabetic retinopathy; CSME clinically significant macular edema; DME diabetic macular edema; dbh dot blot hemorrhages; CWS cotton wool spot; POAG primary open angle glaucoma; C/D cup-to-disc ratio; HVF humphrey visual field; GVF goldmann visual field; OCT optical coherence tomography; IOP intraocular pressure; BRVO Branch retinal vein occlusion; CRVO central retinal vein occlusion; CRAO central retinal artery occlusion; BRAO branch retinal artery occlusion; RT retinal tear; SB scleral buckle; PPV pars plana vitrectomy; VH Vitreous hemorrhage; PRP panretinal laser photocoagulation; IVK intravitreal kenalog; VMT vitreomacular traction; MH Macular hole;  NVD neovascularization of the disc; NVE neovascularization elsewhere; AREDS age related eye disease study; ARMD age related macular degeneration; POAG primary open angle glaucoma; EBMD epithelial/anterior basement membrane dystrophy; ACIOL anterior chamber intraocular lens; IOL intraocular lens; PCIOL posterior chamber intraocular lens; Phaco/IOL phacoemulsification with intraocular lens placement; [place] photorefractive keratectomy; LASIK laser assisted in situ keratomileusis; HTN hypertension; DM diabetes mellitus; COPD chronic obstructive pulmonary disease

## 2021-08-26 NOTE — Assessment & Plan Note (Signed)
Quiescent PDR OS, stable

## 2021-08-29 ENCOUNTER — Encounter: Payer: Self-pay | Admitting: Gastroenterology

## 2021-08-29 ENCOUNTER — Ambulatory Visit (INDEPENDENT_AMBULATORY_CARE_PROVIDER_SITE_OTHER)
Admission: RE | Admit: 2021-08-29 | Discharge: 2021-08-29 | Disposition: A | Discharge status: Home / Self Care | Payer: Federal, State, Local not specified - PPO | Source: Ambulatory Visit | Attending: Gastroenterology | Admitting: Gastroenterology

## 2021-08-29 ENCOUNTER — Ambulatory Visit (INDEPENDENT_AMBULATORY_CARE_PROVIDER_SITE_OTHER): Payer: Federal, State, Local not specified - PPO | Admitting: Gastroenterology

## 2021-08-29 ENCOUNTER — Other Ambulatory Visit (INDEPENDENT_AMBULATORY_CARE_PROVIDER_SITE_OTHER): Payer: Federal, State, Local not specified - PPO

## 2021-08-29 ENCOUNTER — Encounter: Payer: Federal, State, Local not specified - PPO | Admitting: Physical Medicine & Rehabilitation

## 2021-08-29 VITALS — BP 138/82 | HR 64 | Ht 66.0 in | Wt 221.0 lb

## 2021-08-29 DIAGNOSIS — R194 Change in bowel habit: Secondary | ICD-10-CM

## 2021-08-29 DIAGNOSIS — K219 Gastro-esophageal reflux disease without esophagitis: Secondary | ICD-10-CM

## 2021-08-29 DIAGNOSIS — R112 Nausea with vomiting, unspecified: Secondary | ICD-10-CM

## 2021-08-29 DIAGNOSIS — R110 Nausea: Secondary | ICD-10-CM

## 2021-08-29 DIAGNOSIS — R142 Eructation: Secondary | ICD-10-CM | POA: Diagnosis not present

## 2021-08-29 DIAGNOSIS — K5900 Constipation, unspecified: Secondary | ICD-10-CM | POA: Diagnosis not present

## 2021-08-29 DIAGNOSIS — R748 Abnormal levels of other serum enzymes: Secondary | ICD-10-CM | POA: Diagnosis not present

## 2021-08-29 LAB — LIPASE: Lipase: 58 U/L (ref 11.0–59.0)

## 2021-08-29 MED ORDER — PANTOPRAZOLE SODIUM 40 MG PO TBEC: 40.0000 mg | DELAYED_RELEASE_TABLET | Freq: Every day | ORAL | 5 refills | Status: DC

## 2021-08-29 NOTE — Patient Instructions (Signed)
_______________________________________________________  If you are age 52 or older, your body mass index should be between 23-30. Your Body mass index is 35.67 kg/m. If this is out of the aforementioned range listed, please consider follow up with your Primary Care Provider.  If you are age 50 or younger, your body mass index should be between 19-25. Your Body mass index is 35.67 kg/m. If this is out of the aformentioned range listed, please consider follow up with your Primary Care Provider.   ________________________________________________________  The [facility] providers would like to encourage you to use [facility] to communicate with providers for non-urgent requests or questions.  Due to long hold times on the telephone, sending your provider a message by [facility] may be a faster and more efficient way to get a response.  Please allow 48 business hours for a response.  Please remember that this is for non-urgent requests.  _______________________________________________________  We have sent the following medications to your pharmacy for you to pick up at your convenience:  Pantoprazole  Please go the basement to have an [place] provider has requested that you go to the basement level for lab work before leaving today. Press "B" on the elevator. The lab is located at the first door on the left as you exit the elevator.   Take Famotidine at night Small frequent meals  You have been scheduled for a gastric emptying scan at [facility] on 09/12/2021 at 7:30am. Please arrive at least 30 minutes prior to your appointment for registration. Please make certain not to have anything to eat or drink after midnight the night before your test. Hold all stomach medications (ex: Zofran, phenergan, Reglan) 48 hours prior to your test. If you need to reschedule your appointment, please contact radiology scheduling at  [phone]. _____________________________________________________________________ A gastric-emptying study measures how long it takes for food to move through your stomach. There are several ways to measure stomach emptying. In the most common test, you eat food that contains a small amount of radioactive material. A scanner that detects the movement of the radioactive material is placed over your abdomen to monitor the rate at which food leaves your stomach. This test normally takes about 4 hours to complete. _______________________________________________________

## 2021-08-30 ENCOUNTER — Other Ambulatory Visit (HOSPITAL_BASED_OUTPATIENT_CLINIC_OR_DEPARTMENT_OTHER): Payer: Self-pay

## 2021-08-30 DIAGNOSIS — G4733 Obstructive sleep apnea (adult) (pediatric): Secondary | ICD-10-CM | POA: Diagnosis not present

## 2021-09-02 ENCOUNTER — Encounter (INDEPENDENT_AMBULATORY_CARE_PROVIDER_SITE_OTHER): Payer: Self-pay

## 2021-09-02 ENCOUNTER — Other Ambulatory Visit: Payer: Self-pay

## 2021-09-02 DIAGNOSIS — E1169 Type 2 diabetes mellitus with other specified complication: Secondary | ICD-10-CM | POA: Diagnosis not present

## 2021-09-02 DIAGNOSIS — E1122 Type 2 diabetes mellitus with diabetic chronic kidney disease: Secondary | ICD-10-CM | POA: Diagnosis not present

## 2021-09-02 DIAGNOSIS — E1165 Type 2 diabetes mellitus with hyperglycemia: Secondary | ICD-10-CM | POA: Diagnosis not present

## 2021-09-02 DIAGNOSIS — R197 Diarrhea, unspecified: Secondary | ICD-10-CM | POA: Diagnosis not present

## 2021-09-02 MED ORDER — TIZANIDINE HCL 2 MG PO TABS
2.0000 mg | ORAL_TABLET | Freq: Every day | ORAL | 1 refills | Status: DC
Start: 2021-09-02 — End: 2022-01-21

## 2021-09-03 ENCOUNTER — Encounter
Payer: Federal, State, Local not specified - PPO | Attending: Physical Medicine & Rehabilitation | Admitting: Physical Medicine & Rehabilitation

## 2021-09-03 ENCOUNTER — Encounter: Payer: Self-pay | Admitting: Physical Medicine & Rehabilitation

## 2021-09-03 VITALS — BP 108/71 | HR 82 | Ht 66.0 in | Wt 221.8 lb

## 2021-09-03 DIAGNOSIS — M7542 Impingement syndrome of left shoulder: Secondary | ICD-10-CM | POA: Insufficient documentation

## 2021-09-03 DIAGNOSIS — I69351 Hemiplegia and hemiparesis following cerebral infarction affecting right dominant side: Secondary | ICD-10-CM | POA: Insufficient documentation

## 2021-09-03 NOTE — Patient Instructions (Signed)
Shoulder Impingement Syndrome Rehab Ask your health care provider which exercises are safe for you. Do exercises exactly as told by your health care provider and adjust them as directed. It is normal to feel mild stretching, pulling, tightness, or discomfort as you do these exercises. Stop right away if you feel sudden pain or your pain gets worse. Do not begin these exercises until told by your health care provider. Stretching and range-of-motion exercise This exercise warms up your muscles and joints and improves the movement and flexibility of your shoulder. This exercise also helps to relieve pain and stiffness. Passive horizontal adduction In passive adduction, you use your other hand to move the injured arm toward your body. The injured arm does not move on its own. In this movement, your arm is moved across your body in the horizontal plane (horizontal adduction). Sit or stand and pull your left / right elbow across your chest, toward your other shoulder. Stop when you feel a gentle stretch in the back of your shoulder and upper arm. Keep your arm at shoulder height. Keep your arm as close to your body as you comfortably can. Hold for ______20____ seconds. Slowly return to the starting position. Repeat ______5____ times.  Scapular retraction  Sit in a stable chair without armrests, or stand up. Secure an exercise band to a stable object in front of you so the band is at shoulder height. Hold one end of the exercise band in each hand. Your palms should face down. Squeeze your shoulder blades together (retraction) and move your elbows slightly behind you. Do not shrug your shoulders upward while you do this. Hold for ____2______ seconds. Slowly return to the starting position. Repeat ________10__ times. Complete this exercise ____2______ sets Shoulder extension  Sit in a stable chair without armrests, or stand up. Secure an exercise band to a stable object in front of you so the band is  above shoulder height. Hold one end of the exercise band in each hand. Straighten your elbows and lift your hands up to shoulder height. Squeeze your shoulder blades together and pull your hands down to the sides of your thighs (extension). Stop when your hands are straight down by your sides. Do not let your hands go behind your body. Hold for ______2____ seconds. Slowly return to the starting position. Repeat _____10_____ times. Complete this exercise ____2______ sets This information is not intended to replace advice given to you by your health care provider. Make sure you discuss any questions you have with your health care provider. Document Revised: 04/23/2018 Document Reviewed: 01/25/2018 Elsevier Patient Education  [place].

## 2021-09-03 NOTE — Progress Notes (Signed)
Subjective:    Patient ID: Tina [name], female    DOB: Nov 07, 1970, 51 y.o.   MRN: [id number]  HPI 51 year old female with history of bilateral pontine strokes with chronic balance issues as well as a mild right hemiparesis. Left shoulder pain since yesterday , feels like she pulled a muscle , no falls no trauma, has had similar issues on RIght side in the past.  No SOB, sweats , no neck pain  Pain is mainly when trying to elevate the Left arm  Pt off clonidine due to low BP and dizziness, pt feeling better   Pain Inventory Average Pain 6 Pain Right Now 5 My pain is sharp, burning, stabbing, and aching  In the last 24 hours, has pain interfered with the following? General activity 7 Relation with others 7 Enjoyment of life 7 What TIME of day is your pain at its worst? evening and night Sleep (in general) Poor  Pain is worse with: walking, bending, standing, and some activites Pain improves with: rest, heat/ice, pacing activities, and medication Relief from Meds: 8  Family History  Problem Relation Age of Onset   Diabetes Father    Kidney disease Father    Depression Father    Drug abuse Father    Allergic rhinitis Mother    Eczema Mother    Urticaria Mother    Depression Mother    Anxiety disorder Mother    Bipolar disorder Mother    Alcoholism Mother    Drug abuse Mother    Eating disorder Mother    Diabetes Maternal Grandmother    Hyperlipidemia Paternal Grandmother    Stroke Paternal Grandmother    Eczema Sister    Urticaria Sister    Colon cancer Paternal Uncle    Other Neg Hx    Angioedema Neg Hx    Asthma Neg Hx    Colon polyps Neg Hx    Esophageal cancer Neg Hx    Rectal cancer Neg Hx    Stomach cancer Neg Hx    Social History   Socioeconomic History   Marital status: Married    Spouse name: Annie Main   Number of children: 3   Years of education: BA degree   Highest education level: Not on file  Occupational History   Occupation: stay at  home mom    Employer: UNEMPLOYED  Tobacco Use   Smoking status: Never   Smokeless tobacco: Never  Vaping Use   Vaping Use: Never used  Substance and Sexual Activity   Alcohol use: No    Alcohol/week: 0.0 standard drinks of alcohol   Drug use: Not Currently   Sexual activity: Yes    Birth control/protection: None  Other Topics Concern   Not on file  Social History Narrative   Patient is married with 2 children.   Patient is right handed.   Patient has her  BA degree.   Patient drinks 1 cup daily.   Social Determinants of Health   Financial Resource Strain: Not on file  Food Insecurity: No Food Insecurity (07/27/2019)   Hunger Vital Sign    Worried About Running Out of Food in the Last Year: Never true    Ran Out of Food in the Last Year: Never true  Transportation Needs: No Transportation Needs (07/27/2019)   PRAPARE - Hydrologist (Medical): No    Lack of Transportation (Non-Medical): No  Physical Activity: Not on file  Stress: Not on file  Social Connections: Not  on file   Past Surgical History:  Procedure Laterality Date   APPLICATION OF WOUND VAC N/A 05/25/2015   Procedure: APPLICATION OF WOUND VAC;  Surgeon: Michael Boston, MD;  Location: [facility];  Service: General;  Laterality: N/A;   CARDIAC CATHETERIZATION     CESAREAN SECTION  2012   COLONOSCOPY     CORONARY STENT INTERVENTION N/A 09/05/2020   Procedure: CORONARY STENT INTERVENTION;  Surgeon: Nigel Mormon, MD;  Location: [place] [facility];  Service: Cardiovascular;  Laterality: N/A;   DIAGNOSTIC LAPAROSCOPY     ESOPHAGOGASTRODUODENOSCOPY N/A 06/03/2012   Procedure: ESOPHAGOGASTRODUODENOSCOPY (EGD);  Surgeon: [name] Craver, MD;  Location: [facility];  Service: Endoscopy;  Laterality: N/A;   EXCISION MASS ABDOMINAL N/A 05/25/2015   Procedure: ABDOMINAL WALL EXPLORATION EXCISION OF SEROMA REMOVAL OF REDUNDANT SKIN ;  Surgeon: Michael Boston, MD;  Location: [facility];  Service: General;   Laterality: N/A;   EYE SURGERY     Eye laser for vessel hemorrhaging   fybroid removal     HERNIA REPAIR  10/03/2011   ventral hernia repair   INSERTION OF MESH N/A 02/04/2013   Procedure: INSERTION OF MESH;  Surgeon: Adin Hector, MD;  Location: [facility];  Service: General;  Laterality: N/A;   INTRAVASCULAR ULTRASOUND/IVUS N/A 09/05/2020   Procedure: Intravascular Ultrasound/IVUS;  Surgeon: Nigel Mormon, MD;  Location: [place] [facility];  Service: Cardiovascular;  Laterality: N/A;   LEFT HEART CATH AND CORONARY ANGIOGRAPHY N/A 09/05/2020   Procedure: LEFT HEART CATH AND CORONARY ANGIOGRAPHY;  Surgeon: Nigel Mormon, MD;  Location: [place] [facility];  Service: Cardiovascular;  Laterality: N/A;   UMBILICAL HERNIA REPAIR N/A 02/04/2013   Procedure: LAPAROSCOPIC ventral wall hernia repair LAPAROSCOPIC LYSIS OF ADHESIONS laparoscopic exploration of abdomen ;  Surgeon: Adin Hector, MD;  Location: [facility];  Service: General;  Laterality: N/A;   UPPER GASTROINTESTINAL ENDOSCOPY     URETER REVISION     Bilateral "twisted"   UTERINE FIBROID SURGERY     2 SURGERIES FOR FIBROIDS   VENTRAL HERNIA REPAIR  10/03/2011   Procedure: LAPAROSCOPIC VENTRAL HERNIA;  Surgeon: Adin Hector, MD;  Location: [facility];  Service: General;  Laterality: N/A;   Past Surgical History:  Procedure Laterality Date   APPLICATION OF WOUND VAC N/A 05/25/2015   Procedure: APPLICATION OF WOUND VAC;  Surgeon: Michael Boston, MD;  [facility];  Service: General;  Laterality: N/A;   CARDIAC CATHETERIZATION     CESAREAN SECTION  2012   COLONOSCOPY     CORONARY STENT INTERVENTION N/A 09/05/2020   Procedure: CORONARY STENT INTERVENTION;  Surgeon: Nigel Mormon, MD;  Location: [place] [facility];  Service: Cardiovascular;  Laterality: N/A;   DIAGNOSTIC LAPAROSCOPY     ESOPHAGOGASTRODUODENOSCOPY N/A 06/03/2012   Procedure: ESOPHAGOGASTRODUODENOSCOPY (EGD);  Surgeon: [name] Craver, MD;  Location: [facility]  ENDOSCOPY;  Service: Endoscopy;  Laterality: N/A;   EXCISION MASS ABDOMINAL N/A 05/25/2015   Procedure: ABDOMINAL WALL EXPLORATION EXCISION OF SEROMA REMOVAL OF REDUNDANT SKIN ;  Surgeon: Michael Boston, MD;  Location: [facility];  Service: General;  Laterality: N/A;   EYE SURGERY     Eye laser for vessel hemorrhaging   fybroid removal     HERNIA REPAIR  10/03/2011   ventral hernia repair   INSERTION OF MESH N/A 02/04/2013   Procedure: INSERTION OF MESH;  Surgeon: Adin Hector, MD;  Location: [facility];  Service: General;  Laterality: N/A;   INTRAVASCULAR ULTRASOUND/IVUS N/A  09/05/2020   Procedure: Intravascular Ultrasound/IVUS;  Surgeon: Nigel Mormon, MD;  Location: [place] [facility];  Service: Cardiovascular;  Laterality: N/A;   LEFT HEART CATH AND CORONARY ANGIOGRAPHY N/A 09/05/2020   Procedure: LEFT HEART CATH AND CORONARY ANGIOGRAPHY;  Surgeon: Nigel Mormon, MD;  Location: [place] [facility];  Service: Cardiovascular;  Laterality: N/A;   UMBILICAL HERNIA REPAIR N/A 02/04/2013   Procedure: LAPAROSCOPIC ventral wall hernia repair LAPAROSCOPIC LYSIS OF ADHESIONS laparoscopic exploration of abdomen ;  Surgeon: Adin Hector, MD;  Location: [facility];  Service: General;  Laterality: N/A;   UPPER GASTROINTESTINAL ENDOSCOPY     URETER REVISION     Bilateral "twisted"   UTERINE FIBROID SURGERY     2 SURGERIES FOR FIBROIDS   VENTRAL HERNIA REPAIR  10/03/2011   Procedure: LAPAROSCOPIC VENTRAL HERNIA;  Surgeon: Adin Hector, MD;  Location: [facility];  Service: General;  Laterality: N/A;   Past Medical History:  Diagnosis Date   Allergy    Anemia    DURING MENSES--HAS HEAVY BLEEDING WITH PERODS   Angioedema 08/13/2020   Anxiety    Back pain, chronic    "ongoing"   Blood transfusion    IN 2012  AFTER C -SECTION   Cerebral thrombosis with cerebral infarction ([place]) 06/2009   RIGHT SIDED WEAKNESS ( ARM AND LEG ) AND SPASMS-remains with slight weakness and vertigo.   Constipation     Depression    Diabetes mellitus    Diabetic neuropathy (HCC)    BOTH FEET --COMES AND GOES   Edema, lower extremity    Endometriosis    Fatty liver    GERD (gastroesophageal reflux disease)    with pregnancy   H/O eye surgery    Headache(784.0)    MIGRAINES--NOT REALLY HEADACHE-MORE LIKE PRESSURE SENSATION IN HEAD-FEELS DIZZIY AND  FAINT AS THE PRESSURE RESOLVES   Hernia, incisional, RLQ, s/p lap repair Sep 2013 09/02/2011   History of vertigo 03/22/2018   Hx of migraines 10/19/2011   Hyperlipidemia    Hypertension    Leg pain, right    "like bad Charley horse"   Multiple food allergies    Panic disorder without agoraphobia    Rash    HANDS, ARMS --STATES HX OF RASH [place].  STATES THE RASH OFTEN OCCURS WHEN SHE IS REALLY STRESSED."goes and comes-presently left ring finger"   Restless leg syndrome    DIAGNOSED BY SLEEP STUDY - PT TOLD SHE DID NOT HAVE SLEEP APNEA   Right rotator cuff tear    PAIN IN RIGHT SHOULDER   SBO (small bowel obstruction) ([place]) 06/03/2012   Shortness of breath    Spastic hemiplegia affecting dominant side (HCC)    Stomach problems    Stroke (HCC)    Ventral hernia    RIGHT LOWER QUADRANT-CAUSING SOME PAIN   Weakness of right side of body    BP 108/71   Pulse 82   Ht '5\' 6"'$  (1.676 m)   Wt 221 lb 12.8 oz (100.6 kg)   SpO2 98%   BMI 35.80 kg/m   Opioid Risk Score:   Fall Risk Score:  `1  Depression screen PHQ 2/9     09/03/2021    1:23 PM 07/11/2021   11:20 AM 05/30/2021   10:56 AM 03/26/2021   10:21 AM 01/02/2021    4:30 PM 12/25/2020    2:14 PM 11/09/2020    4:15 PM  Depression screen PHQ 2/9  Decreased Interest 1 0 1  1 0 1 3  Down, Depressed, Hopeless 1 0 1 1 0 1 3  PHQ - 2 Score 2 0 2 2 0 2 6  Altered sleeping       3  Tired, decreased energy       3  Change in appetite       2  Feeling bad or failure about yourself        0  Trouble concentrating       2  Moving slowly or fidgety/restless       1   Suicidal thoughts       0  PHQ-9 Score       17  Difficult doing work/chores       Somewhat difficult     Review of Systems  Constitutional: Negative.   HENT: Negative.    Eyes: Negative.   Respiratory: Negative.    Cardiovascular: Negative.   Endocrine: Negative.   Genitourinary: Negative.   Musculoskeletal: Negative.   Skin: Negative.   Allergic/Immunologic: Negative.   Neurological: Negative.   Hematological: Negative.   Psychiatric/Behavioral:  Positive for sleep disturbance.       Objective:   Physical Exam Vitals and nursing note reviewed.  Constitutional:      Appearance: She is obese.  HENT:     Head: Normocephalic and atraumatic.  Eyes:     Extraocular Movements: Extraocular movements intact.     Conjunctiva/sclera: Conjunctivae normal.     Pupils: Pupils are equal, round, and reactive to light.  Musculoskeletal:     Comments: + impingement sign at 90 degrees L shoulder   No pain with ext rotation   Mild pain over trap and AC jt  Skin:    General: Skin is warm and dry.  Neurological:     Mental Status: She is alert and oriented to person, place, and time.     Comments: Motor strength is 4/5 in the right deltoid, bicep, tricep, grip, hip flexor, knee extensor, ankle dorsiflexion plantarflexion Sensation intact to light touch Speech without dysarthria or aphasia  Psychiatric:        Mood and Affect: Mood normal.        Behavior: Behavior normal.           Assessment & Plan:    Left shoulder impingement, no obvious injury , will rest and ice 95mn 2-3 hrs as needed Start impingement exercises (scapular strengthening ) in 1 wk  RTC 4-6 wks if no better will shoulder injection  Chronic gait disorder as well as mild right hemiparesis due to pontine infarct continue home exercise program

## 2021-09-04 ENCOUNTER — Ambulatory Visit (INDEPENDENT_AMBULATORY_CARE_PROVIDER_SITE_OTHER): Payer: Federal, State, Local not specified - PPO | Admitting: Neurology

## 2021-09-04 ENCOUNTER — Encounter: Payer: Self-pay | Admitting: Gastroenterology

## 2021-09-04 ENCOUNTER — Encounter: Payer: Self-pay | Admitting: Neurology

## 2021-09-04 VITALS — BP 136/81 | HR 79 | Ht 66.0 in | Wt 219.0 lb

## 2021-09-04 DIAGNOSIS — Z91199 Patient's noncompliance with other medical treatment and regimen due to unspecified reason: Secondary | ICD-10-CM | POA: Diagnosis not present

## 2021-09-04 DIAGNOSIS — G4719 Other hypersomnia: Secondary | ICD-10-CM

## 2021-09-04 DIAGNOSIS — R142 Eructation: Secondary | ICD-10-CM | POA: Insufficient documentation

## 2021-09-04 DIAGNOSIS — K219 Gastro-esophageal reflux disease without esophagitis: Secondary | ICD-10-CM | POA: Insufficient documentation

## 2021-09-04 DIAGNOSIS — R748 Abnormal levels of other serum enzymes: Secondary | ICD-10-CM | POA: Insufficient documentation

## 2021-09-04 NOTE — Progress Notes (Addendum)
SLEEP MEDICINE CLINIC    Provider:  Larey Seat, MD  Primary Care Physician:  [name], [place] [address]     Referring Provider: Dr [name], MD         Chief Complaint according to patient   Patient presents with:     New Patient (Initial Visit)           HISTORY OF PRESENT ILLNESS:  Yudit NITZA SCHMID is a 51 y.o. year old 44 or [place] American female  patient seen here in a Rv  09/04/2021 . Here for yearly CPAP f/u. Pt reports misplacing her CPAP when relocating. Has not used it in a while. Has recently ordered a new mask last week and will restart as soon as it comes in.   She has not been using her CPAP after it was displaced in a move in February 2023, and she is restarting. Recent data not available.  She needs a new FFm, hose and water chamber.  She will restart and follow up with Np in 6 months time. Has lost  36 pounds.  She saw Dr Terri Skains, [facility] for CAD, and Dr Zadie Rhine for diabetic eye disease.  Non compliance has been a problem in her recent past             Chief concern according to patient : " I am snacking at night and can't lose weight" - "I am not a sleep walker, but I have vivid dreams , lucid dreams. "   So I had the pleasure of seeing her today in a revisit on 04 September 2020 the patient had undergone a sleep study by home sleep test which indicated a very mild sleep apnea not nearly enough to explain the level of hypersomnia she reports.  A total sleep time of 6 hours and 18 minutes has been recorded and her AHI overall was 8.2.  She does have mild to moderate snoring.  There was a higher AHI on the left side than when she slept on her right and in supine higher than nonsupine.  Her study follow through with the recommendation to start an auto titration CPAP and see if that will help with her sleepiness she is using this machine now for the last 34month she received a 3B medical I could connect machine  which is not our usual supplier due to supply chain issues we have had to venture out to other companies.  She has over the last 30 days use the machine 29 out of 30 days and on average she is using it 5 hours.  70% compliance by hourly rate.  Mean pressure is 6.4 average pressure is 8.1 cmH2O she does not have major high leak time residual AHI is down to 0.7 of which there are very few central apneas.  She reports that she sometimes just finds the mask next to her instead of on her face.  She reports that she is less sleepy when she uses CPAP so there has been some effect and today's Epworth sleepiness scale was endorsed at 13 out of 24 points and her fatigue severity scale was not filled.  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 I would lke to test her properly by MSLT-  So we would have to have weaned her off for at least 14 days of bupropion, duloxetine, gabapentin, tizanidine and tramadol and possibly clonidine that is very difficult to do.   I  have the pleasure of seeing Abbygail ACHAIA GARLOCK on 09-12-2019 again, she was last seen by Dr [name] ,MD, on 02-11-2016 ( no longer active patient ). and  has a past medical history of Allergy, Anemia, Anxiety, Back pain, chronic, Blood transfusion, Cerebral thrombosis with cerebral infarction [facility]) (JUNE 2011), Constipation, Depression, Diabetes mellitus, Diabetic neuropathy ([place]), Edema, lower extremity, Endometriosis, Fatty liver, GERD (gastroesophageal reflux disease), H/O eye surgery, Headache(784.0), Hernia, incisional, RLQ, s/p lap repair Sep 2013 (09/02/2011), History of vertigo (03/22/2018), migraines (10/19/2011), Hypertension, Leg pain, right, Multiple food allergies, Panic disorder without agoraphobia, Rash, Restless leg syndrome, Right rotator cuff tear, SBO (small bowel obstruction) ([place]) (06/03/2012), Shortness of breath, Spastic hemiplegia affecting dominant side ([place]),  Ventral hernia.  The patient had the first sleep study in the year  2017, the patient was at the time referred from Dr. [name] of [facility], nurse practitioner to be evaluated for excessive daytime fatigue and sleepiness.  She had a previous sleep study in October 23, 2009 at the time she was clinically depressed following her stroke.  She had main sleep interruptions from kicking and twitching at night.  She endorsed a fatigue score at 63 out of 63 points.  Her AHI index in 2017 was 3.0 she did have frequent periodic limb movements but there was only a moderate degree of arousals from these.   She did not have hypoxemia, there was no reason to initiate CPAP.  We discussed weight loss strategies and to avoid caffeine-containing beverages and chocolate and to review medications and substance use for anything that aggravates restless legs or periodic limb movements at night.  Dr. [name] followed up with the patient the last time in 2018 and he saw her for stroke along the long-term effect tension type headaches she had no significant bruising or bleeding but stayed on a baby aspirin a day.  Hemoglobin A1c at the time was elevated at 8.7.  She had gouty arthritis giving her significant discomfort.  At the time she did not have strokelike symptoms.  She had a left pontine infarct back in June 2011.  Healthy weight and wellness Dr. [name] saw her on 20 July 2019 stated that the patient had uncontrolled hypertension yet falls asleep in daytime including while driving.  This degree of hypersomnia needs to be evaluated she has been on vitamin D supplementation after a deficiency was found she is treated for depression with emotional eating and nocturnal eating.  She would consider herself a stress eater.   She snores. No nocturia.  She is at risk for heart disease due to her diabetes and hypertension history her BMI was 38 her blood pressure was 153/86 during that visit, creatinine was 1.09 triglycerides were highly elevated 323, hemoglobin A1c as of March was 10.1 hemoglobin and  hematocrit were 11.3 and 35 ferritin was low at 35.      Family medical /sleep history: No other family member on CPAP with OSA, mother is suffering from  insomnia, no sleep walkers.    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 She lives in a household with husband, 3 sons an her nephew ( 43).  The patient currently works in the [employer] . Pets are present. One dog.  Tobacco use- none .   ETOH use; none ,  Caffeine intake in form of Coffee( 4/ week) Soda( */ Tea (/) or energy drinks. Regular exercise in form of Yoga , walking  Sleep habits are as follows: The patient's dinner time is between 6.30 PM. The patient goes to bed at variable times- she doses off on the living room and transfer to bed at 3 AM and continues to sleep for 2.5  hours,wakes  At 3AM each night- The preferred sleep position is while on her couch is supine and in her bed she may sleep prone or on her side, with the support of one pillow. Dreams are reportedly frequent/vividly.  5.30  AM is the usual rise time. The patient wakes up spontaneously.  She reports not feeling refreshed or restored in AM, with symptoms such as dry mouth,  and residual fatigue.  There are no morning headaches unless she sleeps in which does not happen on workdays of course.  She does crave naps and she struggles with daytime sleepiness but if she does not have the external timetable of work to follow she may stay in bed and nap on and off for many hours.   Review of Systems: Out of a complete 14 system review, the patient complains of only the following symptoms, and all other reviewed systems are negative.:  Fatigue, sleepiness , snoring, fragmented sleep, Insomnia , poor sleep and an irresistible urge to go to sleep even while in church even while in the midst of other activities operating machinery.  Sleep, and poorly controlled diabetes and HTN-    How likely are you to doze in  the following situations: 0 = not likely, 1 = slight chance, 2 = moderate chance, 3 = high chance   Sitting and Reading? Watching Television? Sitting inactive in a public place (theater or meeting)? As a passenger in a car for an hour without a break? Lying down in the afternoon when circumstances permit? Sitting and talking to someone? Sitting quietly after lunch without alcohol? In a car, while stopped for a few minutes in traffic?   Total = 19/ 24 points , currently off CPAP.   FSS endorsed at i33/ 63 points.   Social History   Socioeconomic History   Marital status: Married    Spouse name: Annie Main   Number of children: 3   Years of education: BA degree   Highest education level: Not on file  Occupational History   Occupation: stay at home mom    Employer: UNEMPLOYED  Tobacco Use   Smoking status: Never   Smokeless tobacco: Never  Vaping Use   Vaping Use: Never used  Substance and Sexual Activity   Alcohol use: No    Alcohol/week: 0.0 standard drinks of alcohol   Drug use: Not Currently   Sexual activity: Yes    Birth control/protection: None  Other Topics Concern   Not on file  Social History Narrative   Patient is married with 2 children.   Patient is right handed.   Patient has her  BA degree.   Patient drinks 1 cup daily.   Social Determinants of Health   Financial Resource Strain: Not on file  Food Insecurity: No Food Insecurity (07/27/2019)   Hunger Vital Sign    Worried About Running Out of Food in the Last Year: Never true    Ran Out of Food in the Last Year: Never true  Transportation Needs: No Transportation Needs (07/27/2019)   PRAPARE - Hydrologist (Medical): No    Lack of Transportation (Non-Medical): No  Physical Activity: Not on file  Stress: Not on file  Social Connections: Not on file  Family History  Problem Relation Age of Onset   Diabetes Father    Kidney disease Father    Depression Father    Drug  abuse Father    Allergic rhinitis Mother    Eczema Mother    Urticaria Mother    Depression Mother    Anxiety disorder Mother    Bipolar disorder Mother    Alcoholism Mother    Drug abuse Mother    Eating disorder Mother    Diabetes Maternal Grandmother    Hyperlipidemia Paternal Grandmother    Stroke Paternal Grandmother    Eczema Sister    Urticaria Sister    Colon cancer Paternal Uncle    Other Neg Hx    Angioedema Neg Hx    Asthma Neg Hx    Colon polyps Neg Hx    Esophageal cancer Neg Hx    Rectal cancer Neg Hx    Stomach cancer Neg Hx     Past Medical History:  Diagnosis Date   Allergy    Anemia    DURING MENSES--HAS HEAVY BLEEDING WITH PERODS   Angioedema 08/13/2020   Anxiety    Back pain, chronic    "ongoing"   Blood transfusion    IN 2012  AFTER C -SECTION   Cerebral thrombosis with cerebral infarction ([place]) 06/2009   RIGHT SIDED WEAKNESS ( ARM AND LEG ) AND SPASMS-remains with slight weakness and vertigo.   Constipation    Depression    Diabetes mellitus    Diabetic neuropathy (HCC)    BOTH FEET --COMES AND GOES   Edema, lower extremity    Endometriosis    Fatty liver    GERD (gastroesophageal reflux disease)    with pregnancy   H/O eye surgery    Headache(784.0)    MIGRAINES--NOT REALLY HEADACHE-MORE LIKE PRESSURE SENSATION IN HEAD-FEELS DIZZIY AND  FAINT AS THE PRESSURE RESOLVES   Hernia, incisional, RLQ, s/p lap repair Sep 2013 09/02/2011   History of vertigo 03/22/2018   Hx of migraines 10/19/2011   Hyperlipidemia    Hypertension    Leg pain, right    "like bad Charley horse"   Multiple food allergies    Panic disorder without agoraphobia    Rash    HANDS, ARMS --STATES HX OF RASH [place].  STATES THE RASH OFTEN OCCURS WHEN SHE IS REALLY STRESSED."goes and comes-presently left ring finger"   Restless leg syndrome    DIAGNOSED BY SLEEP STUDY - PT TOLD SHE DID NOT HAVE SLEEP APNEA   Right rotator cuff tear    PAIN IN  RIGHT SHOULDER   SBO (small bowel obstruction) ([place]) 06/03/2012   Shortness of breath    Spastic hemiplegia affecting dominant side (HCC)    Stomach problems    Stroke (HCC)    Ventral hernia    RIGHT LOWER QUADRANT-CAUSING SOME PAIN   Weakness of right side of body     Past Surgical History:  Procedure Laterality Date   APPLICATION OF WOUND VAC N/A 05/25/2015   Procedure: APPLICATION OF WOUND VAC;  Surgeon: Michael Boston, MD;  Location: [facility];  Service: General;  Laterality: N/A;   CARDIAC CATHETERIZATION     CESAREAN SECTION  2012   COLONOSCOPY     CORONARY STENT INTERVENTION N/A 09/05/2020   Procedure: CORONARY STENT INTERVENTION;  Surgeon: Nigel Mormon, MD;  Location: [place] [facility];  Service: Cardiovascular;  Laterality: N/A;   DIAGNOSTIC LAPAROSCOPY     ESOPHAGOGASTRODUODENOSCOPY N/A 06/03/2012  Procedure: ESOPHAGOGASTRODUODENOSCOPY (EGD);  Surgeon: [name] Craver, MD;  Location: [facility] ENDOSCOPY;  Service: Endoscopy;  Laterality: N/A;   EXCISION MASS ABDOMINAL N/A 05/25/2015   Procedure: ABDOMINAL WALL EXPLORATION EXCISION OF SEROMA REMOVAL OF REDUNDANT SKIN ;  Surgeon: Michael Boston, MD;  Location: [facility];  Service: General;  Laterality: N/A;   EYE SURGERY     Eye laser for vessel hemorrhaging   fybroid removal     HERNIA REPAIR  10/03/2011   ventral hernia repair   INSERTION OF MESH N/A 02/04/2013   Procedure: INSERTION OF MESH;  Surgeon: Adin Hector, MD;  Location: [facility];  Service: General;  Laterality: N/A;   INTRAVASCULAR ULTRASOUND/IVUS N/A 09/05/2020   Procedure: Intravascular Ultrasound/IVUS;  Surgeon: Nigel Mormon, MD;  Location: [place] CV [facility];  Service: Cardiovascular;  Laterality: N/A;   LEFT HEART CATH AND CORONARY ANGIOGRAPHY N/A 09/05/2020   Procedure: LEFT HEART CATH AND CORONARY ANGIOGRAPHY;  Surgeon: Nigel Mormon, MD;  Location: [place] [facility];  Service: Cardiovascular;  Laterality: N/A;   UMBILICAL HERNIA REPAIR N/A  02/04/2013   Procedure: LAPAROSCOPIC ventral wall hernia repair LAPAROSCOPIC LYSIS OF ADHESIONS laparoscopic exploration of abdomen ;  Surgeon: Adin Hector, MD;  Location: [facility];  Service: General;  Laterality: N/A;   UPPER GASTROINTESTINAL ENDOSCOPY     URETER REVISION     Bilateral "twisted"   UTERINE FIBROID SURGERY     2 SURGERIES FOR FIBROIDS   VENTRAL HERNIA REPAIR  10/03/2011   Procedure: LAPAROSCOPIC VENTRAL HERNIA;  Surgeon: Adin Hector, MD;  Location: [facility];  Service: General;  Laterality: N/A;     Current Outpatient Medications on File Prior to Visit  Medication Sig Dispense Refill   albuterol (VENTOLIN HFA) 108 (90 Base) MCG/ACT inhaler Inhale 2 puffs into the lungs every 4 (four) hours as needed for wheezing or shortness of breath. 18 g 1   allopurinol (ZYLOPRIM) 100 MG tablet Take 1 tablet by mouth daily.     amLODipine (NORVASC) 5 MG tablet Take 1 tablet (5 mg total) by mouth daily. 90 tablet 0   aspirin EC 81 MG tablet Take 81 mg by mouth at bedtime. Swallow whole.     atorvastatin (LIPITOR) 40 MG tablet Take 1 tablet (40 mg total) by mouth at bedtime. 90 tablet 0   carvedilol (COREG) 25 MG tablet Take 1 tablet by mouth 2 (two) times daily with a meal.     cetirizine (ZYRTEC) 10 MG tablet Take 10 mg by mouth daily as needed for allergies (itching).     clopidogrel (PLAVIX) 75 MG tablet Take 1 tablet (75 mg total) by mouth daily. 90 tablet 3   colchicine 0.6 MG tablet Take 0.6 mg by mouth daily.     Continuous Blood Gluc Sensor (FREESTYLE LIBRE 2 SENSOR) MISC Inject 1 Device into the skin every 14 (fourteen) days.     DULoxetine (CYMBALTA) 20 MG capsule Take 20-40 mg by mouth See admin instructions. Take 20 mg by mouth in the morning and 40 mg at bedtime     empagliflozin (JARDIANCE) 25 MG TABS tablet 1 tablet     EPINEPHrine (AUVI-Q) 0.3 mg/0.3 mL IJ SOAJ injection Inject 0.3 mg into the muscle as needed for anaphylaxis. 1 each 1   famotidine (PEPCID) 20 MG tablet  Take 1 tablet by mouth 2 (two) times daily.     furosemide (LASIX) 40 MG tablet Take 40 mg by mouth in the morning.     gabapentin (  NEURONTIN) 300 MG capsule Take 300 mg by mouth at bedtime.     hydrALAZINE (APRESOLINE) 10 MG tablet Take 1 tablet (10 mg total) by mouth 3 (three) times daily. 180 tablet 3   Insulin Aspart FlexPen (NOVOLOG) 100 UNIT/ML Inject 0-25 Units into the skin 3 (three) times daily before meals. Dose per sliding scale.     Insulin Glargine (BASAGLAR KWIKPEN) 100 UNIT/ML Inject 22 Units into the skin at bedtime.     isosorbide mononitrate (IMDUR) 60 MG 24 hr tablet TAKE 1 TABLET BY MOUTH DAILY 30 tablet 10   JARDIANCE 10 MG TABS tablet Take 5-10 mg by mouth daily.     pantoprazole (PROTONIX) 40 MG tablet Take 1 tablet (40 mg total) by mouth daily. 30 tablet 5   spironolactone (ALDACTONE) 25 MG tablet Take 25 mg by mouth daily.     tiZANidine (ZANAFLEX) 2 MG tablet Take 1 tablet (2 mg total) by mouth at bedtime. 90 tablet 1   traMADol (ULTRAM) 50 MG tablet Take 1 tablet by mouth 2 (two) times daily.     Vitamin D, Cholecalciferol, 25 MCG (1000 UT) CAPS 1 capsule     VITAMIN D, ERGOCALCIFEROL, PO      [DISCONTINUED] diphenhydrAMINE (BENADRYL) 25 MG tablet Take 1 tablet (25 mg total) by mouth every 8 (eight) hours as needed for up to 2 days for itching.     No current facility-administered medications on file prior to visit.    Allergies  Allergen Reactions   Amlodipine Benzoate Swelling and Other (See Comments)    Leg swelling Other reaction(s): leg swelling Other reaction(s): ? leg swelling at 10 mg dose Other reaction(s): ? leg swelling at 10 mg dose Other reaction(s): ? leg swelling at 10 mg dose   Contrast Media [Iodinated Contrast Media] Shortness Of Breath and Other (See Comments)    Difficulty breathing   Iodine Anaphylaxis    Other reaction(s): Unknown Other reaction(s): Unknown Other reaction(s): Unknown Other reaction(s): Unknown   Iohexol Hives, Nausea  And Vomiting, Swelling and Other (See Comments)     Desc: Magnevist-gadolinium-difficulty breathing, throat swelling    Midazolam Hcl Anaphylaxis    Difficulty breathing   Other Shortness Of Breath, Itching and Other (See Comments)    Patient is allergic to all nuts except peanuts, which are NOT "nuts"- they are legumes   Shellfish Allergy Anaphylaxis   Shellfish-Derived Products Anaphylaxis and Other (See Comments)    Other reaction(s): Unknown   Valsartan Swelling    Other reaction(s): Unknown Other reaction(s): Unknown Other reaction(s): angioedema Other reaction(s): angioedema   Metformin And Related Diarrhea, Nausea And Vomiting and Other (See Comments)    Dehydration, also   [place] [Dapagliflozin] Nausea Only and Other (See Comments)    UTI, headaches and muscle aches, also   Hydralazine Other (See Comments)    Reaction not recalled Other reaction(s): Unknown Other reaction(s): Unknown Other reaction(s): Unknown Other reaction(s): Unknown   Saxagliptin Hives, Nausea And Vomiting and Other (See Comments)    ONGLYZA- dehydration, also Other reaction(s): Unknown Other reaction(s): Unknown Other reaction(s): Unknown Other reaction(s): Unknown   Spironolactone Other (See Comments)    Elevated potassium- Severe hyperkalemia Other reaction(s): severe hyperkalemia Other reaction(s): severe hyperkalemia Other reaction(s): severe hyperkalemia   Avandia [Rosiglitazone Maleate] Hives and Other (See Comments)   Geodon [Ziprasidone] Other (See Comments)    Reaction not recalled   Kiwi Extract Itching and Swelling   Latex Itching    Other reaction(s): Unknown Other reaction(s): Unknown  Physical exam:  Today's Vitals   09/04/21 1106  BP: 136/81  Pulse: 79  Weight: 219 lb (99.3 kg)  Height: '5\' 6"'  (1.676 m)   Body mass index is 35.35 kg/m.   Wt Readings from Last 3 Encounters:  09/04/21 219 lb (99.3 kg)  09/03/21 221 lb 12.8 oz (100.6 kg)  08/29/21 221 lb (100.2  kg)     Ht Readings from Last 3 Encounters:  09/04/21 '5\' 6"'  (1.676 m)  09/03/21 '5\' 6"'  (1.676 m)  08/29/21 '5\' 6"'  (1.676 m)      General: The patient is awake, alert and appears not in acute distress. The patient is well groomed. Head: Normocephalic, atraumatic. Neck is supple. Mallampati 3  neck circumference:16 inches . Nasal airflow patent.  Retrognathia is  seen.   Cardiovascular:  Regular rate and cardiac rhythm by pulse,  without distended neck veins. Respiratory: Lungs are clear to auscultation.  Skin:  Without evidence of ankle edema, or rash. Trunk: The patient's posture is erect.   Neurologic exam : The patient is awake and alert, oriented to place and time.   Memory subjective described as intact.  Attention span & concentration ability appears normal.  Speech is fluent,  without  dysarthria, dysphonia or aphasia.  Mood and affect are appropriate.   Cranial nerves: no loss of smell or taste reported - less taste but not a loss. No covid infection. Fully vaccinated.   Pupils are equal and briskly reactive to light. Funduscopic exam def.erreed  Extraocular movements in vertical and horizontal planes were intact and without nystagmus. No Diplopia. Visual fields by finger perimetry are intact. Hearing was intact to soft voice and finger rubbing.    Facial sensation intact to fine touch.  Facial motor strength is symmetric and tongue and uvula move midline.  Neck ROM : rotation, tilt and flexion extension were normal for age and shoulder shrug was symmetrical.    Motor exam:  Symmetric bulk and ROM.  right hemiparesis by report.  Normal tone without cog- wheeling, right sided elevated tone over biceps.  Right sided reduced  grip strength .   Sensory:  she is now on gabapentin.  Proprioception tested in the upper extremities was normal.   Coordination: Rapid alternating movements in the fingers/hands were of normal speed.  The Finger-to-nose maneuver was intact without  evidence of ataxia, dysmetria or tremor.   Gait and station: Patient could rise unassisted from a seated position, walked without assistive device.  Stance is of normal width/ base and the patient turned with 3steps.  Toe and heel walk were deferred.  Deep tendon reflexes: in the  upper and lower extremities are symmetric and intact.  Babinski response was deferred.      After spending a total time of 30 minutes face to face and additional time for physical and neurologic examination, review of laboratory studies,  personal review of imaging studies, reports and results of other testing and review of referral information / records as far as provided in visit, I have established the following assessments:  1) Mrs. Haberl describes sleep paralysis attacks, the irresistible urge to fall asleep in daytime she can not help it, she has stated that she has vivid dreams that sometimes are hard to differentiate from reality and that she has vivid dreams throughout the night sometimes waking her.  She did not endorse cataplectic symptoms.   Epworth sleepiness score under CPAP had been endorsed at 13 points. Pre CPAP ESS was 19 points !!! .  OSA was mild but her CPAP therapy had shown benefit, she also is less sleepy on Cymbalta and prn gabapentin. She stopped CPAP use, I can't find data from any months of this year- non compliant.   #2 we discussed to evaluate her for possible narcolepsy.  HLA narcolepsy panel was positive !  She has been on medication that are impossible to wean off.   #3 Her stroke ( 2013) affected the left brain hemisphere and left her with some mild residual right-sided symptoms, it is not usually the location that would be associated with hypersomnia.  That can lead post stroke depression which also can lead to a blunted affect and less energy overall.     My Plan is to proceed with: I would like to thank Dr Dennard Nip ,MD and [name], DO , Patient will follow up after  she has restarted CPAP use. I will ask her to return with NP in 6 months.   Patient needs to bring her LUNA machine -RV through our NP within 6 month.   CC: I will share my notes with PCP, too.  Electronically signed by: Larey Seat, MD 09/04/2021 11:25 AM  [facility] Board certified by The [employer] of Sleep Medicine and Diplomate of the [employer] of Sleep Medicine. Board certified In Neurology through the [place], Fellow of the [employer] of Neurology. Medical Director of [employer] [facility].

## 2021-09-04 NOTE — Patient Instructions (Signed)

## 2021-09-04 NOTE — Progress Notes (Signed)
09/04/2021 Tina [name] [id number] December 12, 1970   HISTORY OF PRESENT ILLNESS: This is a 51 year old female with multiple medical problems as listed below, but has been diabetic for close to 30 years.  Overall this has been poorly controlled with improvement in hemoglobin A1c at 7.8 most recently.  Also has a history of stroke and is on Plavix.  She is a patient of Dr. Doyne Keel.  She is here today at the referral of her PCP, Dr. Theda Sers, for evaluation of diarrhea.  Patient tells me that her primary complaints are of nausea, belching that smells like rotten eggs, intermittent vomiting for several months.  Also has some epigastric abdominal discomfort.  Has been on Ozempic, which has helped with her blood sugars and has also helped her lose weight.  She recently had an elevated lipase of 101.  She says that her bowel habits alternate between constipation for 2 weeks and then diarrhea for some time.  Says the diarrhea has been more so an issue over the past few weeks.  She denies any rectal bleeding.  She has had 2 incomplete colonoscopy preps in 2020 with retained stool throughout the colon.  She was just recently placed on Pepcid 20 mg twice daily.   EGD 04/29/18 - normal esophagus, mild gastritis, benign duodenal peptic duodenitis, H pylori negative    Past Medical History:  Diagnosis Date   Allergy    Anemia    DURING MENSES--HAS HEAVY BLEEDING WITH PERODS   Angioedema 08/13/2020   Anxiety    Back pain, chronic    "ongoing"   Blood transfusion    IN 2012  AFTER C -SECTION   Cerebral thrombosis with cerebral infarction ([place]) 06/2009   RIGHT SIDED WEAKNESS ( ARM AND LEG ) AND SPASMS-remains with slight weakness and vertigo.   Constipation    Depression    Diabetes mellitus    Diabetic neuropathy (HCC)    BOTH FEET --COMES AND GOES   Edema, lower extremity    Endometriosis    Fatty liver    GERD (gastroesophageal reflux disease)    with pregnancy   H/O eye surgery     Headache(784.0)    MIGRAINES--NOT REALLY HEADACHE-MORE LIKE PRESSURE SENSATION IN HEAD-FEELS DIZZIY AND  FAINT AS THE PRESSURE RESOLVES   Hernia, incisional, RLQ, s/p lap repair Sep 2013 09/02/2011   History of vertigo 03/22/2018   Hx of migraines 10/19/2011   Hyperlipidemia    Hypertension    Leg pain, right    "like bad Charley horse"   Multiple food allergies    Panic disorder without agoraphobia    Rash    HANDS, ARMS --STATES HX OF RASH [place].  STATES THE RASH OFTEN OCCURS WHEN SHE IS REALLY STRESSED."goes and comes-presently left ring finger"   Restless leg syndrome    DIAGNOSED BY SLEEP STUDY - PT TOLD SHE DID NOT HAVE SLEEP APNEA   Right rotator cuff tear    PAIN IN RIGHT SHOULDER   SBO (small bowel obstruction) ([place]) 06/03/2012   Shortness of breath    Spastic hemiplegia affecting dominant side (HCC)    Stomach problems    Stroke (HCC)    Ventral hernia    RIGHT LOWER QUADRANT-CAUSING SOME PAIN   Weakness of right side of body    Past Surgical History:  Procedure Laterality Date   APPLICATION OF WOUND VAC N/A 05/25/2015   Procedure: APPLICATION OF WOUND VAC;  Surgeon: Michael Boston, MD;  Location: [facility];  Service: General;  Laterality: N/A;   CARDIAC CATHETERIZATION     CESAREAN SECTION  2012   COLONOSCOPY     CORONARY STENT INTERVENTION N/A 09/05/2020   Procedure: CORONARY STENT INTERVENTION;  Surgeon: Nigel Mormon, MD;  Location: [place] [facility];  Service: Cardiovascular;  Laterality: N/A;   DIAGNOSTIC LAPAROSCOPY     ESOPHAGOGASTRODUODENOSCOPY N/A 06/03/2012   Procedure: ESOPHAGOGASTRODUODENOSCOPY (EGD);  Surgeon: [name] Craver, MD;  Location: [facility];  Service: Endoscopy;  Laterality: N/A;   EXCISION MASS ABDOMINAL N/A 05/25/2015   Procedure: ABDOMINAL WALL EXPLORATION EXCISION OF SEROMA REMOVAL OF REDUNDANT SKIN ;  Surgeon: Michael Boston, MD;  Location: [facility];  Service: General;  Laterality: N/A;   EYE SURGERY     Eye  laser for vessel hemorrhaging   fybroid removal     HERNIA REPAIR  10/03/2011   ventral hernia repair   INSERTION OF MESH N/A 02/04/2013   Procedure: INSERTION OF MESH;  Surgeon: Adin Hector, MD;  Location: [facility];  Service: General;  Laterality: N/A;   INTRAVASCULAR ULTRASOUND/IVUS N/A 09/05/2020   Procedure: Intravascular Ultrasound/IVUS;  Surgeon: Nigel Mormon, MD;  Location: [place] [facility];  Service: Cardiovascular;  Laterality: N/A;   LEFT HEART CATH AND CORONARY ANGIOGRAPHY N/A 09/05/2020   Procedure: LEFT HEART CATH AND CORONARY ANGIOGRAPHY;  Surgeon: Nigel Mormon, MD;  Location: [place] [facility];  Service: Cardiovascular;  Laterality: N/A;   UMBILICAL HERNIA REPAIR N/A 02/04/2013   Procedure: LAPAROSCOPIC ventral wall hernia repair LAPAROSCOPIC LYSIS OF ADHESIONS laparoscopic exploration of abdomen ;  Surgeon: Adin Hector, MD;  Location: [facility];  Service: General;  Laterality: N/A;   UPPER GASTROINTESTINAL ENDOSCOPY     URETER REVISION     Bilateral "twisted"   UTERINE FIBROID SURGERY     2 SURGERIES FOR FIBROIDS   VENTRAL HERNIA REPAIR  10/03/2011   Procedure: LAPAROSCOPIC VENTRAL HERNIA;  Surgeon: Adin Hector, MD;  Location: [facility];  Service: General;  Laterality: N/A;    reports that she has never smoked. She has never used smokeless tobacco. She reports that she does not currently use drugs. She reports that she does not drink alcohol. family history includes Alcoholism in her mother; Allergic rhinitis in her mother; Anxiety disorder in her mother; Bipolar disorder in her mother; Colon cancer in her paternal uncle; Depression in her father and mother; Diabetes in her father and maternal grandmother; Drug abuse in her father and mother; Eating disorder in her mother; Eczema in her mother and sister; Hyperlipidemia in her paternal grandmother; Kidney disease in her father; Stroke in her paternal grandmother; Urticaria in her mother and  sister. Allergies  Allergen Reactions   Amlodipine Benzoate Swelling and Other (See Comments)    Leg swelling Other reaction(s): leg swelling Other reaction(s): ? leg swelling at 10 mg dose Other reaction(s): ? leg swelling at 10 mg dose Other reaction(s): ? leg swelling at 10 mg dose   Contrast Media [Iodinated Contrast Media] Shortness Of Breath and Other (See Comments)    Difficulty breathing   Iodine Anaphylaxis    Other reaction(s): Unknown Other reaction(s): Unknown Other reaction(s): Unknown Other reaction(s): Unknown   Iohexol Hives, Nausea And Vomiting, Swelling and Other (See Comments)     Desc: Magnevist-gadolinium-difficulty breathing, throat swelling    Midazolam Hcl Anaphylaxis    Difficulty breathing   Other Shortness Of Breath, Itching and Other (See Comments)    Patient is allergic to all nuts except peanuts, which  are NOT "nuts"- they are legumes   Shellfish Allergy Anaphylaxis   Shellfish-Derived Products Anaphylaxis and Other (See Comments)    Other reaction(s): Unknown   Valsartan Swelling    Other reaction(s): Unknown Other reaction(s): Unknown Other reaction(s): angioedema Other reaction(s): angioedema   Metformin And Related Diarrhea, Nausea And Vomiting and Other (See Comments)    Dehydration, also   [place] [Dapagliflozin] Nausea Only and Other (See Comments)    UTI, headaches and muscle aches, also   Hydralazine Other (See Comments)    Reaction not recalled Other reaction(s): Unknown Other reaction(s): Unknown Other reaction(s): Unknown Other reaction(s): Unknown   Saxagliptin Hives, Nausea And Vomiting and Other (See Comments)    ONGLYZA- dehydration, also Other reaction(s): Unknown Other reaction(s): Unknown Other reaction(s): Unknown Other reaction(s): Unknown   Spironolactone Other (See Comments)    Elevated potassium- Severe hyperkalemia Other reaction(s): severe hyperkalemia Other reaction(s): severe hyperkalemia Other reaction(s):  severe hyperkalemia   Avandia [Rosiglitazone Maleate] Hives and Other (See Comments)   Geodon [Ziprasidone] Other (See Comments)    Reaction not recalled   Kiwi Extract Itching and Swelling   Latex Itching    Other reaction(s): Unknown Other reaction(s): Unknown      Outpatient Encounter Medications as of 08/29/2021  Medication Sig   allopurinol (ZYLOPRIM) 100 MG tablet Take 1 tablet by mouth daily.   aspirin EC 81 MG tablet Take 81 mg by mouth at bedtime. Swallow whole.   carvedilol (COREG) 25 MG tablet Take 1 tablet by mouth 2 (two) times daily with a meal.   cetirizine (ZYRTEC) 10 MG tablet Take 10 mg by mouth daily as needed for allergies (itching).   clopidogrel (PLAVIX) 75 MG tablet Take 1 tablet (75 mg total) by mouth daily.   colchicine 0.6 MG tablet Take 0.6 mg by mouth daily.   Continuous Blood Gluc Sensor (FREESTYLE LIBRE 2 SENSOR) MISC Inject 1 Device into the skin every 14 (fourteen) days.   DULoxetine (CYMBALTA) 20 MG capsule Take 20-40 mg by mouth See admin instructions. Take 20 mg by mouth in the morning and 40 mg at bedtime   empagliflozin (JARDIANCE) 25 MG TABS tablet 1 tablet   EPINEPHrine (AUVI-Q) 0.3 mg/0.3 mL IJ SOAJ injection Inject 0.3 mg into the muscle as needed for anaphylaxis.   famotidine (PEPCID) 20 MG tablet Take 1 tablet by mouth 2 (two) times daily.   furosemide (LASIX) 40 MG tablet Take 40 mg by mouth in the morning.   gabapentin (NEURONTIN) 300 MG capsule Take 300 mg by mouth at bedtime.   hydrALAZINE (APRESOLINE) 10 MG tablet Take 1 tablet (10 mg total) by mouth 3 (three) times daily.   Insulin Aspart FlexPen (NOVOLOG) 100 UNIT/ML Inject 0-25 Units into the skin 3 (three) times daily before meals. Dose per sliding scale.   isosorbide mononitrate (IMDUR) 60 MG 24 hr tablet TAKE 1 TABLET BY MOUTH DAILY   pantoprazole (PROTONIX) 40 MG tablet Take 1 tablet (40 mg total) by mouth daily.   spironolactone (ALDACTONE) 25 MG tablet Take 25 mg by mouth daily.    traMADol (ULTRAM) 50 MG tablet Take 1 tablet by mouth 2 (two) times daily.   Vitamin D, Cholecalciferol, 25 MCG (1000 UT) CAPS 1 capsule   VITAMIN D, ERGOCALCIFEROL, PO    [DISCONTINUED] tiZANidine (ZANAFLEX) 2 MG tablet Take 1 tablet (2 mg total) by mouth at bedtime.   albuterol (VENTOLIN HFA) 108 (90 Base) MCG/ACT inhaler Inhale 2 puffs into the lungs every 4 (four) hours as needed  for wheezing or shortness of breath.   amLODipine (NORVASC) 5 MG tablet Take 1 tablet (5 mg total) by mouth daily.   atorvastatin (LIPITOR) 40 MG tablet Take 1 tablet (40 mg total) by mouth at bedtime.   Insulin Glargine (BASAGLAR KWIKPEN) 100 UNIT/ML Inject 22 Units into the skin at bedtime.   JARDIANCE 10 MG TABS tablet Take 5-10 mg by mouth daily.   [DISCONTINUED] diphenhydrAMINE (BENADRYL) 25 MG tablet Take 1 tablet (25 mg total) by mouth every 8 (eight) hours as needed for up to 2 days for itching.   [DISCONTINUED] OZEMPIC, 2 MG/DOSE, 8 MG/3ML SOPN Inject 2 mg into the skin once a week.   [DISCONTINUED] Semaglutide, 1 MG/DOSE, (OZEMPIC, 1 MG/DOSE,) 4 MG/3ML SOPN    [DISCONTINUED] Semaglutide, 2 MG/DOSE, (OZEMPIC, 2 MG/DOSE,) 8 MG/3ML SOPN 2 mg subcutaneous once weekly 28 days   [DISCONTINUED] Semaglutide, 2 MG/DOSE, (OZEMPIC, 2 MG/DOSE,) 8 MG/3ML SOPN Inject 2 mg subcutaneous once weekly 84 days   [DISCONTINUED] Semaglutide, 2 MG/DOSE, (OZEMPIC, 2 MG/DOSE,) 8 MG/3ML SOPN 2 mg subcutaneous once weekly 84 days   No facility-administered encounter medications on file as of 08/29/2021.     REVIEW OF SYSTEMS  : All other systems reviewed and negative except where noted in the History of Present Illness.   PHYSICAL EXAM: BP 138/82   Pulse 64   Ht '5\' 6"'$  (1.676 m)   Wt 221 lb (100.2 kg)   BMI 35.67 kg/m  General: Well developed female in no acute distress Head: Normocephalic and atraumatic Eyes:  Sclerae anicteric, conjunctiva pink. Ears: Normal auditory acuity Lungs: Clear throughout to auscultation; no  W/R/R. Heart: Regular rate and rhythm; no M/R/G. Abdomen: Soft, obese, non-distended.  BS present.  Mild diffuse TTP. Musculoskeletal: Symmetrical with no gross deformities  Skin: No lesions on visible extremities Extremities: No edema  Neurological: Alert oriented x 4, grossly non-focal Psychological:  Alert and cooperative. Normal mood and affect  ASSESSMENT AND PLAN: *51 year old female who is a longstanding diabetic for close to 30 years, uncontrolled hemoglobin A1c improved at 7.8 recently with complaints of nausea, intermittent vomiting, belching, epigastric abdominal pain, and GERD.  I suspect she likely has a component of gastroparesis.  She had recently been placed on Ozempic for her diabetes.  That likely significantly has worsened her symptoms.  She has been off of that now for about a week.  We will plan for gastric emptying scan, but I would like to wait at least a couple more weeks from now before doing that in order for the Ozempic to get out of her system further.  We will see what her gastric emptying looks like at baseline.  I know the Ozempic is helping with her blood sugars and with weight loss, but I am fearful that will worsen her GI symptoms if she restarts it again.  She is not on a PPI some to put her on pantoprazole 40 mg daily.  Prescription sent to pharmacy.  She does have famotidine, which I would like her to take at bedtime.  We also discussed a gastroparesis diet with small frequent meals, etc. and she was given literature on this. *Elevated lipase: This is a one-time elevation at 101.  She has some upper abdominal discomfort associated with her other symptoms as above, but no significant pain.  This could also have been caused by the Grenelefe.  Once again has now been on hold.  We will repeat a lipase today. *Altered bowel habits with constipation alternating with  diarrhea/loose stools: I am actually curious if she may be struggling more with constipation and having some  overflow diarrhea/self catharsis as she had 2 poor bowel prep so colonoscopy is in 2020.  We will check an abdominal x-ray to look at stool burden. *Colon cancer screening: We will need some type of screening as two colonoscopy preps were inadequate for screening in 2020.  We will plan for after trying to sort out some of the above issues.   CC:  [name], DO

## 2021-09-04 NOTE — Progress Notes (Signed)
Agree with assessment and plan as outlined.  

## 2021-09-12 ENCOUNTER — Ambulatory Visit (HOSPITAL_COMMUNITY)
Admission: RE | Admit: 2021-09-12 | Discharge: 2021-09-12 | Disposition: A | Discharge status: Home / Self Care | Payer: Federal, State, Local not specified - PPO | Source: Ambulatory Visit | Attending: Gastroenterology | Admitting: Gastroenterology

## 2021-09-12 ENCOUNTER — Other Ambulatory Visit: Payer: Self-pay | Admitting: Cardiology

## 2021-09-12 DIAGNOSIS — R748 Abnormal levels of other serum enzymes: Secondary | ICD-10-CM | POA: Insufficient documentation

## 2021-09-12 DIAGNOSIS — R112 Nausea with vomiting, unspecified: Secondary | ICD-10-CM | POA: Insufficient documentation

## 2021-09-12 DIAGNOSIS — R1010 Upper abdominal pain, unspecified: Secondary | ICD-10-CM | POA: Diagnosis not present

## 2021-09-12 DIAGNOSIS — K219 Gastro-esophageal reflux disease without esophagitis: Secondary | ICD-10-CM | POA: Diagnosis not present

## 2021-09-12 DIAGNOSIS — R110 Nausea: Secondary | ICD-10-CM | POA: Insufficient documentation

## 2021-09-12 DIAGNOSIS — R194 Change in bowel habit: Secondary | ICD-10-CM | POA: Insufficient documentation

## 2021-09-12 DIAGNOSIS — R142 Eructation: Secondary | ICD-10-CM | POA: Diagnosis not present

## 2021-09-12 MED ORDER — TECHNETIUM TC 99M SULFUR COLLOID
2.1000 | Freq: Once | INTRAVENOUS | Status: AC
  Administered 2021-09-12: 2.1 via ORAL

## 2021-09-30 DIAGNOSIS — G4733 Obstructive sleep apnea (adult) (pediatric): Secondary | ICD-10-CM | POA: Diagnosis not present

## 2021-10-03 DIAGNOSIS — E1122 Type 2 diabetes mellitus with diabetic chronic kidney disease: Secondary | ICD-10-CM | POA: Diagnosis not present

## 2021-10-03 DIAGNOSIS — E1165 Type 2 diabetes mellitus with hyperglycemia: Secondary | ICD-10-CM | POA: Diagnosis not present

## 2021-10-03 DIAGNOSIS — E1169 Type 2 diabetes mellitus with other specified complication: Secondary | ICD-10-CM | POA: Diagnosis not present

## 2021-10-07 ENCOUNTER — Ambulatory Visit (INDEPENDENT_AMBULATORY_CARE_PROVIDER_SITE_OTHER): Payer: Federal, State, Local not specified - PPO | Admitting: Ophthalmology

## 2021-10-07 ENCOUNTER — Encounter (INDEPENDENT_AMBULATORY_CARE_PROVIDER_SITE_OTHER): Payer: Self-pay | Admitting: Ophthalmology

## 2021-10-07 DIAGNOSIS — E113552 Type 2 diabetes mellitus with stable proliferative diabetic retinopathy, left eye: Secondary | ICD-10-CM

## 2021-10-07 DIAGNOSIS — E113511 Type 2 diabetes mellitus with proliferative diabetic retinopathy with macular edema, right eye: Secondary | ICD-10-CM | POA: Diagnosis not present

## 2021-10-07 MED ORDER — AFLIBERCEPT 2MG/0.05ML IZ SOLN FOR KALEIDOSCOPE
2.0000 mg | INTRAVITREAL | Status: AC | PRN
  Administered 2021-10-07: 2 mg via INTRAVITREAL

## 2021-10-07 NOTE — Assessment & Plan Note (Signed)
ED today stable macular edema and improving at 6-week interval repeat injection Eylea today and extend interval examination next 8 to 9 weeks

## 2021-10-07 NOTE — Progress Notes (Signed)
10/07/2021     CHIEF COMPLAINT Patient presents for  Chief Complaint  Patient presents with   Diabetic Retinopathy with Macular Edema      HISTORY OF PRESENT ILLNESS: Tina [name] is a 51 y.o. female who presents to the clinic today for:   HPI   6 weeks for OD, DILATE EYLEA OCT.  Stable acuity Pt stated vision has been stable since last visit.    Last edited by Hurman Horn, MD on 10/07/2021 11:35 AM.      Referring physician: [name], DO [address] [place],  [place] [address]  HISTORICAL INFORMATION:   Selected notes from the MEDICAL RECORD NUMBER    Lab Results  Component Value Date   HGBA1C 8.4 (H) 08/13/2020     CURRENT MEDICATIONS: No current outpatient medications on file. (Ophthalmic Drugs)   No current facility-administered medications for this visit. (Ophthalmic Drugs)   Current Outpatient Medications (Other)  Medication Sig   albuterol (VENTOLIN HFA) 108 (90 Base) MCG/ACT inhaler Inhale 2 puffs into the lungs every 4 (four) hours as needed for wheezing or shortness of breath.   allopurinol (ZYLOPRIM) 100 MG tablet Take 1 tablet by mouth daily.   amLODipine (NORVASC) 5 MG tablet Take 1 tablet (5 mg total) by mouth daily.   aspirin EC 81 MG tablet Take 81 mg by mouth at bedtime. Swallow whole.   atorvastatin (LIPITOR) 40 MG tablet Take 1 tablet (40 mg total) by mouth at bedtime.   carvedilol (COREG) 25 MG tablet Take 1 tablet by mouth 2 (two) times daily with a meal.   cetirizine (ZYRTEC) 10 MG tablet Take 10 mg by mouth daily as needed for allergies (itching).   clopidogrel (PLAVIX) 75 MG tablet Take 1 tablet (75 mg total) by mouth daily.   colchicine 0.6 MG tablet Take 0.6 mg by mouth daily.   Continuous Blood Gluc Sensor (FREESTYLE LIBRE 2 SENSOR) MISC Inject 1 Device into the skin every 14 (fourteen) days.   DULoxetine (CYMBALTA) 20 MG capsule Take 20-40 mg by mouth See admin instructions. Take 20 mg by mouth in the  morning and 40 mg at bedtime   empagliflozin (JARDIANCE) 25 MG TABS tablet 1 tablet   EPINEPHrine (AUVI-Q) 0.3 mg/0.3 mL IJ SOAJ injection Inject 0.3 mg into the muscle as needed for anaphylaxis.   famotidine (PEPCID) 20 MG tablet Take 1 tablet by mouth 2 (two) times daily.   furosemide (LASIX) 40 MG tablet Take 40 mg by mouth in the morning.   gabapentin (NEURONTIN) 300 MG capsule Take 300 mg by mouth at bedtime.   hydrALAZINE (APRESOLINE) 10 MG tablet TAKE ONE TABLET BY MOUTH THREE TIMES DAILY   Insulin Aspart FlexPen (NOVOLOG) 100 UNIT/ML Inject 0-25 Units into the skin 3 (three) times daily before meals. Dose per sliding scale.   Insulin Glargine (BASAGLAR KWIKPEN) 100 UNIT/ML Inject 22 Units into the skin at bedtime.   isosorbide mononitrate (IMDUR) 60 MG 24 hr tablet TAKE 1 TABLET BY MOUTH DAILY   JARDIANCE 10 MG TABS tablet Take 5-10 mg by mouth daily.   pantoprazole (PROTONIX) 40 MG tablet Take 1 tablet (40 mg total) by mouth daily.   spironolactone (ALDACTONE) 25 MG tablet Take 25 mg by mouth daily.   tiZANidine (ZANAFLEX) 2 MG tablet Take 1 tablet (2 mg total) by mouth at bedtime.   traMADol (ULTRAM) 50 MG tablet Take 1 tablet by mouth 2 (two) times daily.   Vitamin D, Cholecalciferol, 25  MCG (1000 UT) CAPS 1 capsule   VITAMIN D, ERGOCALCIFEROL, PO    No current facility-administered medications for this visit. (Other)      REVIEW OF SYSTEMS: ROS   Negative for: Constitutional, Gastrointestinal, Neurological, Skin, Genitourinary, Musculoskeletal, HENT, Endocrine, Cardiovascular, Eyes, Respiratory, Psychiatric, Allergic/Imm, Heme/Lymph Last edited by Silvestre Moment on 10/07/2021 10:19 AM.       ALLERGIES Allergies  Allergen Reactions   Amlodipine Benzoate Swelling and Other (See Comments)    Leg swelling Other reaction(s): leg swelling Other reaction(s): ? leg swelling at 10 mg dose Other reaction(s): ? leg swelling at 10 mg dose Other reaction(s): ? leg swelling at 10 mg dose    Contrast Media [Iodinated Contrast Media] Shortness Of Breath and Other (See Comments)    Difficulty breathing   Iodine Anaphylaxis    Other reaction(s): Unknown Other reaction(s): Unknown Other reaction(s): Unknown Other reaction(s): Unknown   Iohexol Hives, Nausea And Vomiting, Swelling and Other (See Comments)     Desc: Magnevist-gadolinium-difficulty breathing, throat swelling    Midazolam Hcl Anaphylaxis    Difficulty breathing   Other Shortness Of Breath, Itching and Other (See Comments)    Patient is allergic to all nuts except peanuts, which are NOT "nuts"- they are legumes   Shellfish Allergy Anaphylaxis   Shellfish-Derived Products Anaphylaxis and Other (See Comments)    Other reaction(s): Unknown   Valsartan Swelling    Other reaction(s): Unknown Other reaction(s): Unknown Other reaction(s): angioedema Other reaction(s): angioedema   Metformin And Related Diarrhea, Nausea And Vomiting and Other (See Comments)    Dehydration, also   [place] [Dapagliflozin] Nausea Only and Other (See Comments)    UTI, headaches and muscle aches, also   Hydralazine Other (See Comments)    Reaction not recalled Other reaction(s): Unknown Other reaction(s): Unknown Other reaction(s): Unknown Other reaction(s): Unknown   Saxagliptin Hives, Nausea And Vomiting and Other (See Comments)    ONGLYZA- dehydration, also Other reaction(s): Unknown Other reaction(s): Unknown Other reaction(s): Unknown Other reaction(s): Unknown   Spironolactone Other (See Comments)    Elevated potassium- Severe hyperkalemia Other reaction(s): severe hyperkalemia Other reaction(s): severe hyperkalemia Other reaction(s): severe hyperkalemia   Avandia [Rosiglitazone Maleate] Hives and Other (See Comments)   Geodon [Ziprasidone] Other (See Comments)    Reaction not recalled   Kiwi Extract Itching and Swelling   Latex Itching    Other reaction(s): Unknown Other reaction(s): Unknown    PAST MEDICAL  HISTORY Past Medical History:  Diagnosis Date   Allergy    Anemia    DURING MENSES--HAS HEAVY BLEEDING WITH PERODS   Angioedema 08/13/2020   Anxiety    Back pain, chronic    "ongoing"   Blood transfusion    IN 2012  AFTER C -SECTION   Cerebral thrombosis with cerebral infarction ([place]) 06/2009   RIGHT SIDED WEAKNESS ( ARM AND LEG ) AND SPASMS-remains with slight weakness and vertigo.   Constipation    Depression    Diabetes mellitus    Diabetic neuropathy (HCC)    BOTH FEET --COMES AND GOES   Edema, lower extremity    Endometriosis    Fatty liver    GERD (gastroesophageal reflux disease)    with pregnancy   H/O eye surgery    Headache(784.0)    MIGRAINES--NOT REALLY HEADACHE-MORE LIKE PRESSURE SENSATION IN HEAD-FEELS DIZZIY AND  FAINT AS THE PRESSURE RESOLVES   Hernia, incisional, RLQ, s/p lap repair Sep 2013 09/02/2011   History of vertigo 03/22/2018   Hx of migraines 10/19/2011  Hyperlipidemia    Hypertension    Leg pain, right    "like bad Charley horse"   Multiple food allergies    Panic disorder without agoraphobia    Rash    HANDS, ARMS --STATES HX OF RASH EVER SINCE CHILDBIRTH/PREGNANCY.  STATES THE RASH OFTEN OCCURS WHEN SHE IS REALLY STRESSED."goes and comes-presently left ring finger"   Restless leg syndrome    DIAGNOSED BY SLEEP STUDY - PT TOLD SHE DID NOT HAVE SLEEP APNEA   Right rotator cuff tear    PAIN IN RIGHT SHOULDER   SBO (small bowel obstruction) ([place]) 06/03/2012   Shortness of breath    Spastic hemiplegia affecting dominant side (HCC)    Stomach problems    Stroke (HCC)    Ventral hernia    RIGHT LOWER QUADRANT-CAUSING SOME PAIN   Weakness of right side of body    Past Surgical History:  Procedure Laterality Date   APPLICATION OF WOUND VAC N/A 05/25/2015   Procedure: APPLICATION OF WOUND VAC;  Surgeon: Michael Boston, MD;  Location: [facility];  Service: General;  Laterality: N/A;   CARDIAC CATHETERIZATION     CESAREAN SECTION  2012    COLONOSCOPY     CORONARY STENT INTERVENTION N/A 09/05/2020   Procedure: CORONARY STENT INTERVENTION;  Surgeon: Nigel Mormon, MD;  Location: [place] [facility];  Service: Cardiovascular;  Laterality: N/A;   DIAGNOSTIC LAPAROSCOPY     ESOPHAGOGASTRODUODENOSCOPY N/A 06/03/2012   Procedure: ESOPHAGOGASTRODUODENOSCOPY (EGD);  Surgeon: [name] Craver, MD;  Location: [facility] ENDOS[facility];  Service: Endoscopy;  Laterality: N/A;   EXCISION MASS ABDOMINAL N/A 05/25/2015   Procedure: ABDOMINAL WALL EXPLORATION EXCISION OF SEROMA REMOVAL OF REDUNDANT SKIN ;  Surgeon: Michael Boston, MD;  Location: [facility];  Service: General;  Laterality: N/A;   EYE SURGERY     Eye laser for vessel hemorrhaging   fybroid removal     HERNIA REPAIR  10/03/2011   ventral hernia repair   INSERTION OF MESH N/A 02/04/2013   Procedure: INSERTION OF MESH;  Surgeon: Adin Hector, MD;  Location: [facility];  Service: General;  Laterality: N/A;   INTRAVASCULAR ULTRASOUND/IVUS N/A 09/05/2020   Procedure: Intravascular Ultrasound/IVUS;  Surgeon: Nigel Mormon, MD;  Location: [facility];  Service: Cardiovascular;  Laterality: N/A;   LEFT HEART CATH AND CORONARY ANGIOGRAPHY N/A 09/05/2020   Procedure: LEFT HEART CATH AND CORONARY ANGIOGRAPHY;  Surgeon: Nigel Mormon, MD;  Location: [place] [facility];  Service: Cardiovascular;  Laterality: N/A;   UMBILICAL HERNIA REPAIR N/A 02/04/2013   Procedure: LAPAROSCOPIC ventral wall hernia repair LAPAROSCOPIC LYSIS OF ADHESIONS laparoscopic exploration of abdomen ;  Surgeon: Adin Hector, MD;  Location: [facility];  Service: General;  Laterality: N/A;   UPPER GASTROINTESTINAL ENDOSCOPY     URETER REVISION     Bilateral "twisted"   UTERINE FIBROID SURGERY     2 SURGERIES FOR FIBROIDS   VENTRAL HERNIA REPAIR  10/03/2011   Procedure: LAPAROSCOPIC VENTRAL HERNIA;  Surgeon: Adin Hector, MD;  Location: [facility];  Service: General;  Laterality: N/A;    FAMILY HISTORY Family  History  Problem Relation Age of Onset   Diabetes Father    Kidney disease Father    Depression Father    Drug abuse Father    Allergic rhinitis Mother    Eczema Mother    Urticaria Mother    Depression Mother    Anxiety disorder Mother    Bipolar disorder Mother  Alcoholism Mother    Drug abuse Mother    Eating disorder Mother    Diabetes Maternal Grandmother    Hyperlipidemia Paternal Grandmother    Stroke Paternal Grandmother    Eczema Sister    Urticaria Sister    Colon cancer Paternal Uncle    Other Neg Hx    Angioedema Neg Hx    Asthma Neg Hx    Colon polyps Neg Hx    Esophageal cancer Neg Hx    Rectal cancer Neg Hx    Stomach cancer Neg Hx     SOCIAL HISTORY Social History   Tobacco Use   Smoking status: Never   Smokeless tobacco: Never  Vaping Use   Vaping Use: Never used  Substance Use Topics   Alcohol use: No    Alcohol/week: 0.0 standard drinks of alcohol   Drug use: Not Currently         OPHTHALMIC EXAM:  Base Eye Exam     Visual Acuity (ETDRS)       Right Left   Dist Strum 20/20 -2 20/20 -1         Tonometry (Tonopen, 10:23 AM)       Right Left   Pressure 14 14         Pupils       Pupils APD   Right PERRL None   Left PERRL None         Visual Fields       Left Right    Full Full         Extraocular Movement       Right Left    Full, Ortho Full, Ortho         Neuro/Psych     Oriented x3: Yes   Mood/Affect: Normal         Dilation     Right eye: 2.5% Phenylephrine, 1.0% Mydriacyl @ 10:23 AM           Slit Lamp and Fundus Exam     External Exam       Right Left   External Normal Normal         Slit Lamp Exam       Right Left   Lids/Lashes Normal Normal   Conjunctiva/Sclera White and quiet White and quiet   Cornea Clear Clear   Anterior Chamber Deep and quiet Deep and quiet   Iris Round and reactive Round and reactive   Lens Centered Posterior chamber intraocular lens, 2+ Posterior  capsular opacification Posterior chamber intraocular lens   Anterior Vitreous Normal Normal         Fundus Exam       Right Left   Posterior Vitreous Vitrectomized    Disc Normal    C/D Ratio 0.2    Macula Microaneurysms, no exudates, Macular thickening, Moderate clinically significant macular edema    Vessels PDR-quiet    Periphery Good PRP,  attached             IMAGING AND PROCEDURES  Imaging and Procedures for 10/07/21  OCT, Retina - OU - Both Eyes       Right Eye Quality was good. Scan locations included subfoveal. Central Foveal Thickness: 367. Progression has been stable. Findings include abnormal foveal contour, cystoid macular edema.   Left Eye Quality was good. Scan locations included subfoveal. Central Foveal Thickness: 346. Progression has been stable. Findings include normal foveal contour.   Notes Temporal thickening worsening post Avastin and now improved post  injection Eylea, now with center involved CSME OD, repeat vegF OD today with Eylea post minor improvement             Intravitreal Injection, Pharmacologic Agent - OD - Right Eye       Time Out 10/07/2021. 11:32 AM. Confirmed correct patient, procedure, site, and patient consented.   Anesthesia Topical anesthesia was used. Anesthetic medications included Lidocaine 4%.   Procedure Preparation included 5% betadine to ocular surface, 10% betadine to eyelids. A 30 gauge needle was used.   Injection: 2 mg aflibercept 2 MG/0.05ML   Route: Intravitreal, Site: Right Eye   NDC: [id number], Lot: [id number], Expiration date: 10/14/2022, Waste: 0 mL   Post-op Post injection exam found visual acuity of at least counting fingers. The patient tolerated the procedure well. There were no complications. The patient received written and verbal post procedure care education. Post injection medications included ocuflox.              ASSESSMENT/PLAN:  Diabetic macular edema of right eye with  proliferative retinopathy associated with type 2 diabetes mellitus ([place]) ED today stable macular edema and improving at 6-week interval repeat injection Eylea today and extend interval examination next 8 to 9 weeks  Stable treated proliferative diabetic retinopathy of left eye determined by examination associated with type 2 diabetes mellitus ([place]) No sign of recurrent CME OS today by OCT     ICD-10-CM   1. Diabetic macular edema of right eye with proliferative retinopathy associated with type 2 diabetes mellitus (HCC)  E11.3511 OCT, Retina - OU - Both Eyes    Intravitreal Injection, Pharmacologic Agent - OD - Right Eye    aflibercept (EYLEA) SOLN 2 mg    2. Stable treated proliferative diabetic retinopathy of left eye determined by examination associated with type 2 diabetes mellitus ([place])  K99.8338       1.  The doing well.  Less CSME.  Currently at 6-week interval.  Now after completion of loading doses of Eylea may extend interval examination to 8 to 9 weeks  2.  OS doing well.  No sign of recurrence of CSME.  Quiescent PDR  3.  Ophthalmic Meds Ordered this visit:  Meds ordered this encounter  Medications   aflibercept (EYLEA) SOLN 2 mg       Return in about 9 weeks (around 12/09/2021) for DILATE OU, EYLEA OCT, OD.  Patient Instructions  Patient instructed to call 7 to 10 days prior to next visit to confirm participation with [facility] or we can make other arrangements for her to be seen by another provider for evaluation and possible injection   Explained the diagnoses, plan, and follow up with the patient and they expressed understanding.  Patient expressed understanding of the importance of proper follow up care.   Clent Demark Makeisha Jentsch M.D. Diseases & Surgery of the Retina and Vitreous Retina & Diabetic [place] 10/07/21     Abbreviations: M myopia (nearsighted); A astigmatism; H hyperopia (farsighted); P presbyopia; Mrx spectacle prescription;  CTL  contact lenses; OD right eye; OS left eye; OU both eyes  XT exotropia; ET esotropia; PEK punctate epithelial keratitis; PEE punctate epithelial erosions; DES dry eye syndrome; MGD meibomian gland dysfunction; ATs artificial tears; PFAT's preservative free artificial tears; [place] nuclear sclerotic cataract; PSC posterior subcapsular cataract; ERM epi-retinal membrane; PVD posterior vitreous detachment; RD retinal detachment; DM diabetes mellitus; DR diabetic retinopathy; NPDR non-proliferative diabetic retinopathy; PDR proliferative diabetic retinopathy; CSME clinically significant macular edema; DME diabetic  macular edema; dbh dot blot hemorrhages; CWS cotton wool spot; POAG primary open angle glaucoma; C/D cup-to-disc ratio; HVF humphrey visual field; GVF goldmann visual field; OCT optical coherence tomography; IOP intraocular pressure; BRVO Branch retinal vein occlusion; CRVO central retinal vein occlusion; CRAO central retinal artery occlusion; BRAO branch retinal artery occlusion; RT retinal tear; SB scleral buckle; PPV pars plana vitrectomy; VH Vitreous hemorrhage; PRP panretinal laser photocoagulation; IVK intravitreal kenalog; VMT vitreomacular traction; MH Macular hole;  NVD neovascularization of the disc; NVE neovascularization elsewhere; AREDS age related eye disease study; ARMD age related macular degeneration; POAG primary open angle glaucoma; EBMD epithelial/anterior basement membrane dystrophy; ACIOL anterior chamber intraocular lens; IOL intraocular lens; PCIOL posterior chamber intraocular lens; Phaco/IOL phacoemulsification with intraocular lens placement; [place] photorefractive keratectomy; LASIK laser assisted in situ keratomileusis; HTN hypertension; DM diabetes mellitus; COPD chronic obstructive pulmonary disease

## 2021-10-07 NOTE — Assessment & Plan Note (Signed)
No sign of recurrent CME OS today by OCT

## 2021-10-07 NOTE — Patient Instructions (Signed)
Patient instructed to call 7 to 10 days prior to next visit to confirm participation with [facility] or we can make other arrangements for her to be seen by another provider for evaluation and possible injection

## 2021-10-25 ENCOUNTER — Encounter: Payer: Federal, State, Local not specified - PPO | Admitting: Physical Medicine & Rehabilitation

## 2021-10-30 DIAGNOSIS — G4733 Obstructive sleep apnea (adult) (pediatric): Secondary | ICD-10-CM | POA: Diagnosis not present

## 2021-11-02 DIAGNOSIS — E1122 Type 2 diabetes mellitus with diabetic chronic kidney disease: Secondary | ICD-10-CM | POA: Diagnosis not present

## 2021-11-02 DIAGNOSIS — E1169 Type 2 diabetes mellitus with other specified complication: Secondary | ICD-10-CM | POA: Diagnosis not present

## 2021-11-02 DIAGNOSIS — E1165 Type 2 diabetes mellitus with hyperglycemia: Secondary | ICD-10-CM | POA: Diagnosis not present

## 2021-11-19 ENCOUNTER — Encounter
Payer: Federal, State, Local not specified - PPO | Attending: Physical Medicine & Rehabilitation | Admitting: Physical Medicine & Rehabilitation

## 2021-11-19 ENCOUNTER — Encounter: Payer: Self-pay | Admitting: Physical Medicine & Rehabilitation

## 2021-11-19 VITALS — BP 125/75 | HR 82 | Ht 66.0 in | Wt 231.0 lb

## 2021-11-19 DIAGNOSIS — G894 Chronic pain syndrome: Secondary | ICD-10-CM

## 2021-11-19 DIAGNOSIS — I10 Essential (primary) hypertension: Secondary | ICD-10-CM | POA: Diagnosis not present

## 2021-11-19 DIAGNOSIS — E11319 Type 2 diabetes mellitus with unspecified diabetic retinopathy without macular edema: Secondary | ICD-10-CM | POA: Diagnosis not present

## 2021-11-19 DIAGNOSIS — E118 Type 2 diabetes mellitus with unspecified complications: Secondary | ICD-10-CM | POA: Diagnosis not present

## 2021-11-19 DIAGNOSIS — Z79891 Long term (current) use of opiate analgesic: Secondary | ICD-10-CM | POA: Diagnosis not present

## 2021-11-19 DIAGNOSIS — Z5181 Encounter for therapeutic drug level monitoring: Secondary | ICD-10-CM | POA: Diagnosis not present

## 2021-11-19 DIAGNOSIS — E785 Hyperlipidemia, unspecified: Secondary | ICD-10-CM | POA: Diagnosis not present

## 2021-11-19 DIAGNOSIS — M7061 Trochanteric bursitis, right hip: Secondary | ICD-10-CM

## 2021-11-19 MED ORDER — LIDOCAINE HCL 1 % IJ SOLN
5.0000 mL | Freq: Once | INTRAMUSCULAR | Status: AC
  Administered 2021-11-19: 5 mL

## 2021-11-19 MED ORDER — BETAMETHASONE SOD PHOS & ACET 6 (3-3) MG/ML IJ SUSP
6.0000 mg | Freq: Once | INTRAMUSCULAR | Status: AC
  Administered 2021-11-19: 6 mg via INTRAMUSCULAR

## 2021-11-19 NOTE — Progress Notes (Signed)
Subjective:    Patient ID: Tina [name], female    DOB: 1970-06-05, 51 y.o.   MRN: [id number]  HPI Patient states hip is hurting worse than shoulder  RIght lateral hip pain fairly constant , hurts with  Right posterior hip pain is intermittent , has had relief with RIght sacroiliac in the past but no injections for several years  Patient has not had any recent falls or trauma to the hip or the shoulder area.  She feels like her shoulder is better with the exercises. Pain Inventory Average Pain 8 Pain Right Now 8 My pain is constant and aching  In the last 24 hours, has pain interfered with the following? General activity 8 Relation with others 8 Enjoyment of life 8 What TIME of day is your pain at its worst? varies Sleep (in general) Poor  Pain is worse with: walking, bending, sitting, inactivity, standing, and some activites Pain improves with:  nothing Relief from Meds: 0  Family History  Problem Relation Age of Onset   Diabetes Father    Kidney disease Father    Depression Father    Drug abuse Father    Allergic rhinitis Mother    Eczema Mother    Urticaria Mother    Depression Mother    Anxiety disorder Mother    Bipolar disorder Mother    Alcoholism Mother    Drug abuse Mother    Eating disorder Mother    Diabetes Maternal Grandmother    Hyperlipidemia Paternal Grandmother    Stroke Paternal Grandmother    Eczema Sister    Urticaria Sister    Colon cancer Paternal Uncle    Other Neg Hx    Angioedema Neg Hx    Asthma Neg Hx    Colon polyps Neg Hx    Esophageal cancer Neg Hx    Rectal cancer Neg Hx    Stomach cancer Neg Hx    Social History   Socioeconomic History   Marital status: Married    Spouse name: Annie Main   Number of children: 3   Years of education: BA degree   Highest education level: Not on file  Occupational History   Occupation: stay at home mom    Employer: UNEMPLOYED  Tobacco Use   Smoking status: Never   Smokeless tobacco:  Never  Vaping Use   Vaping Use: Never used  Substance and Sexual Activity   Alcohol use: No    Alcohol/week: 0.0 standard drinks of alcohol   Drug use: Not Currently   Sexual activity: Yes    Birth control/protection: None  Other Topics Concern   Not on file  Social History Narrative   Patient is married with 2 children.   Patient is right handed.   Patient has her  BA degree.   Patient drinks 1 cup daily.   Social Determinants of Health   Financial Resource Strain: Not on file  Food Insecurity: No Food Insecurity (07/27/2019)   Hunger Vital Sign    Worried About Running Out of Food in the Last Year: Never true    Ran Out of Food in the Last Year: Never true  Transportation Needs: No Transportation Needs (07/27/2019)   PRAPARE - Hydrologist (Medical): No    Lack of Transportation (Non-Medical): No  Physical Activity: Not on file  Stress: Not on file  Social Connections: Not on file   Past Surgical History:  Procedure Laterality Date   APPLICATION OF WOUND VAC  N/A 05/25/2015   Procedure: APPLICATION OF WOUND VAC;  Surgeon: Michael Boston, MD;  Location: [facility];  Service: General;  Laterality: N/A;   CARDIAC CATHETERIZATION     CESAREAN SECTION  2012   COLONOSCOPY     CORONARY STENT INTERVENTION N/A 09/05/2020   Procedure: CORONARY STENT INTERVENTION;  Surgeon: Nigel Mormon, MD;  Location: [place] [facility];  Service: Cardiovascular;  Laterality: N/A;   DIAGNOSTIC LAPAROSCOPY     ESOPHAGOGASTRODUODENOSCOPY N/A 06/03/2012   Procedure: ESOPHAGOGASTRODUODENOSCOPY (EGD);  Surgeon: [name] Craver, MD;  Location: [facility];  Service: Endoscopy;  Laterality: N/A;   EXCISION MASS ABDOMINAL N/A 05/25/2015   Procedure: ABDOMINAL WALL EXPLORATION EXCISION OF SEROMA REMOVAL OF REDUNDANT SKIN ;  Surgeon: Michael Boston, MD;  Location: [facility];  Service: General;  Laterality: N/A;   EYE SURGERY     Eye laser for vessel hemorrhaging   fybroid removal      HERNIA REPAIR  10/03/2011   ventral hernia repair   INSERTION OF MESH N/A 02/04/2013   Procedure: INSERTION OF MESH;  Surgeon: Adin Hector, MD;  Location: [facility];  Service: General;  Laterality: N/A;   INTRAVASCULAR ULTRASOUND/IVUS N/A 09/05/2020   Procedure: Intravascular Ultrasound/IVUS;  Surgeon: Nigel Mormon, MD;  Location: [place] [facility];  Service: Cardiovascular;  Laterality: N/A;   LEFT HEART CATH AND CORONARY ANGIOGRAPHY N/A 09/05/2020   Procedure: LEFT HEART CATH AND CORONARY ANGIOGRAPHY;  Surgeon: Nigel Mormon, MD;  Location: [place] [facility];  Service: Cardiovascular;  Laterality: N/A;   UMBILICAL HERNIA REPAIR N/A 02/04/2013   Procedure: LAPAROSCOPIC ventral wall hernia repair LAPAROSCOPIC LYSIS OF ADHESIONS laparoscopic exploration of abdomen ;  Surgeon: Adin Hector, MD;  Location: [facility];  Service: General;  Laterality: N/A;   UPPER GASTROINTESTINAL ENDOSCOPY     URETER REVISION     Bilateral "twisted"   UTERINE FIBROID SURGERY     2 SURGERIES FOR FIBROIDS   VENTRAL HERNIA REPAIR  10/03/2011   Procedure: LAPAROSCOPIC VENTRAL HERNIA;  Surgeon: Adin Hector, MD;  Location: [facility];  Service: General;  Laterality: N/A;   Past Surgical History:  Procedure Laterality Date   APPLICATION OF WOUND VAC N/A 05/25/2015   Procedure: APPLICATION OF WOUND VAC;  Surgeon: Michael Boston, MD;  Location: [facility];  Service: General;  Laterality: N/A;   CARDIAC CATHETERIZATION     CESAREAN SECTION  2012   COLONOSCOPY     CORONARY STENT INTERVENTION N/A 09/05/2020   Procedure: CORONARY STENT INTERVENTION;  Surgeon: Nigel Mormon, MD;  Location: [place] [facility];  Service: Cardiovascular;  Laterality: N/A;   DIAGNOSTIC LAPAROSCOPY     ESOPHAGOGASTRODUODENOSCOPY N/A 06/03/2012   Procedure: ESOPHAGOGASTRODUODENOSCOPY (EGD);  Surgeon: [name] Craver, MD;  [facility];  Service: Endoscopy;  Laterality: N/A;   EXCISION MASS ABDOMINAL N/A 05/25/2015    Procedure: ABDOMINAL WALL EXPLORATION EXCISION OF SEROMA REMOVAL OF REDUNDANT SKIN ;  Surgeon: Michael Boston, MD;  Location: [facility];  Service: General;  Laterality: N/A;   EYE SURGERY     Eye laser for vessel hemorrhaging   fybroid removal     HERNIA REPAIR  10/03/2011   ventral hernia repair   INSERTION OF MESH N/A 02/04/2013   Procedure: INSERTION OF MESH;  Surgeon: Adin Hector, MD;  Location: [facility];  Service: General;  Laterality: N/A;   INTRAVASCULAR ULTRASOUND/IVUS N/A 09/05/2020   Procedure: Intravascular Ultrasound/IVUS;  Surgeon: Nigel Mormon, MD;  Location: [place] [facility];  Service: Cardiovascular;  Laterality: N/A;   LEFT HEART CATH AND CORONARY ANGIOGRAPHY N/A 09/05/2020   Procedure: LEFT HEART CATH AND CORONARY ANGIOGRAPHY;  Surgeon: Nigel Mormon, MD;  Location: [place] [facility];  Service: Cardiovascular;  Laterality: N/A;   UMBILICAL HERNIA REPAIR N/A 02/04/2013   Procedure: LAPAROSCOPIC ventral wall hernia repair LAPAROSCOPIC LYSIS OF ADHESIONS laparoscopic exploration of abdomen ;  Surgeon: Adin Hector, MD;  Location: [facility];  Service: General;  Laterality: N/A;   UPPER GASTROINTESTINAL ENDOSCOPY     URETER REVISION     Bilateral "twisted"   UTERINE FIBROID SURGERY     2 SURGERIES FOR FIBROIDS   VENTRAL HERNIA REPAIR  10/03/2011   Procedure: LAPAROSCOPIC VENTRAL HERNIA;  Surgeon: Adin Hector, MD;  Location: [facility];  Service: General;  Laterality: N/A;   Past Medical History:  Diagnosis Date   Allergy    Anemia    DURING MENSES--HAS HEAVY BLEEDING WITH PERODS   Angioedema 08/13/2020   Anxiety    Back pain, chronic    "ongoing"   Blood transfusion    IN 2012  AFTER C -SECTION   Cerebral thrombosis with cerebral infarction ([place]) 06/2009   RIGHT SIDED WEAKNESS ( ARM AND LEG ) AND SPASMS-remains with slight weakness and vertigo.   Constipation    Depression    Diabetes mellitus    Diabetic neuropathy (HCC)    BOTH FEET --COMES AND  GOES   Edema, lower extremity    Endometriosis    Fatty liver    GERD (gastroesophageal reflux disease)    with pregnancy   H/O eye surgery    Headache(784.0)    MIGRAINES--NOT REALLY HEADACHE-MORE LIKE PRESSURE SENSATION IN HEAD-FEELS DIZZIY AND  FAINT AS THE PRESSURE RESOLVES   Hernia, incisional, RLQ, s/p lap repair Sep 2013 09/02/2011   History of vertigo 03/22/2018   Hx of migraines 10/19/2011   Hyperlipidemia    Hypertension    Leg pain, right    "like bad Charley horse"   Multiple food allergies    Panic disorder without agoraphobia    Rash    HANDS, ARMS --STATES HX OF RASH [place].  STATES THE RASH OFTEN OCCURS WHEN SHE IS REALLY STRESSED."goes and comes-presently left ring finger"   Restless leg syndrome    DIAGNOSED BY SLEEP STUDY - PT TOLD SHE DID NOT HAVE SLEEP APNEA   Right rotator cuff tear    PAIN IN RIGHT SHOULDER   SBO (small bowel obstruction) ([place]) 06/03/2012   Shortness of breath    Spastic hemiplegia affecting dominant side (HCC)    Stomach problems    Stroke (HCC)    Ventral hernia    RIGHT LOWER QUADRANT-CAUSING SOME PAIN   Weakness of right side of body    BP 125/75   Pulse 82   Ht '5\' 6"'$  (1.676 m)   Wt 231 lb (104.8 kg)   SpO2 (!) 82%   BMI 37.28 kg/m   Opioid Risk Score:   Fall Risk Score:  `1  Depression screen PHQ 2/9     09/03/2021    1:23 PM 07/11/2021   11:20 AM 05/30/2021   10:56 AM 03/26/2021   10:21 AM 01/02/2021    4:30 PM 12/25/2020    2:14 PM 11/09/2020    4:15 PM  Depression screen PHQ 2/9  Decreased Interest 1 0 1 1 0 1 3  Down, Depressed, Hopeless 1 0 1 1 0 1 3  PHQ -  2 Score 2 0 2 2 0 2 6  Altered sleeping       3  Tired, decreased energy       3  Change in appetite       2  Feeling bad or failure about yourself        0  Trouble concentrating       2  Moving slowly or fidgety/restless       1  Suicidal thoughts       0  PHQ-9 Score       17  Difficult doing work/chores       Somewhat  difficult     Review of Systems  Musculoskeletal:        Right hip pain Left chest into shoulder pain  All other systems reviewed and are negative.      Objective:   Physical Exam  There is mild tenderness palpation over the right PSIS no tenderness over the left PSIS there is tender in his bilateral troches bursa but worse on the right side than on the left side Ambulates without assist device no evidence of toe drag or knee instability General no acute distress Mood and affect are appropriate Motor strength is 4/5 on the right side and 5/5 in the left side in the upper and lower limb      Assessment & Plan:  1.  History of bilateral pontine infarcts with residual right hemiparesis.  Overall functioning at a modified independent level.  She has had musculoskeletal issues related to asymmetric gait pattern Current symptoms most consistent with trochanteric bursitis exam is consistent with this as well. Given problems with sleep due to pain with laying on the right side would recommend injection at this time she is a diabetic we discussed checking her blood sugar more often and may need to supplement additional short acting insulin over the next 2 to 3 days  Trochanteric bursa injection without ultrasound guidance  Indication Trochanteric bursitis. Exam has tenderness over the greater trochanter of the hip. Pain has not responded to conservative care such as exercise therapy and oral medications. Pain interferes with sleep or with mobility Informed consent was obtained after describing risks and benefits of the procedure with the patient these include bleeding bruising and infection. Patient has signed written consent form. Patient placed in a lateral decubitus position with the affected hip superior. Point of maximal pain was palpated marked and prepped with Betadine and entered with a needle to bone contact. Needle slightly withdrawn then '6mg'$  of betamethasone with 4 cc 1% lidocaine  were injected. Patient tolerated procedure well. Post procedure instructions given.

## 2021-11-19 NOTE — Patient Instructions (Signed)
Consider Shockwave therapy for RIght hip bursitis if cortisone injection is not helpful.    Other option is PT

## 2021-11-21 LAB — MED LIST OPTION NOT SELECTED

## 2021-11-22 ENCOUNTER — Ambulatory Visit: Payer: Federal, State, Local not specified - PPO | Admitting: Physical Medicine & Rehabilitation

## 2021-11-22 LAB — TOXASSURE SELECT,+ANTIDEPR,UR

## 2021-11-27 ENCOUNTER — Telehealth: Payer: Self-pay | Admitting: *Deleted

## 2021-11-27 NOTE — Telephone Encounter (Signed)
Urine drug screen for this encounter is consistent for prescribed medication 

## 2021-11-28 ENCOUNTER — Telehealth: Payer: Self-pay | Admitting: Gastroenterology

## 2021-11-28 NOTE — Telephone Encounter (Signed)
Patient called states her phone was not working for [employer] and was just able to get all her messages. Would like a call back to go over results.

## 2021-11-28 NOTE — Telephone Encounter (Signed)
Please let her know that her gastric emptying scan did show delayed gastric emptying.  I think she needs to stay off of her Ozempic as this could have caused her elevated lipase and slow her stomach emptying even further.  I had prescribed pantoprazole 40 mg daily and told her to take pepcid at bedtime.  Also we discussed eating small, frequent meals.  How have her symptoms been with doing all of that?  We could certainly try reglan to try to speed up her stomach emptying but I would like her to see me or Dr. Havery Moros to discuss it further first.  If she is interested then please make her an appt next available.

## 2021-11-29 NOTE — Telephone Encounter (Signed)
The pt has been advised of her results.  She has been taking her meds as prescribed and remained off Ozemic.  She has a follow up with Dr Havery Moros to discuss next steps.

## 2021-12-03 DIAGNOSIS — E1165 Type 2 diabetes mellitus with hyperglycemia: Secondary | ICD-10-CM | POA: Diagnosis not present

## 2021-12-03 DIAGNOSIS — E1169 Type 2 diabetes mellitus with other specified complication: Secondary | ICD-10-CM | POA: Diagnosis not present

## 2021-12-03 DIAGNOSIS — E1122 Type 2 diabetes mellitus with diabetic chronic kidney disease: Secondary | ICD-10-CM | POA: Diagnosis not present

## 2021-12-10 ENCOUNTER — Encounter (INDEPENDENT_AMBULATORY_CARE_PROVIDER_SITE_OTHER): Payer: Federal, State, Local not specified - PPO | Admitting: Ophthalmology

## 2021-12-13 ENCOUNTER — Other Ambulatory Visit: Payer: Self-pay | Admitting: Gastroenterology

## 2021-12-17 ENCOUNTER — Ambulatory Visit (INDEPENDENT_AMBULATORY_CARE_PROVIDER_SITE_OTHER): Payer: PPO | Admitting: Gastroenterology

## 2021-12-17 ENCOUNTER — Encounter: Payer: Self-pay | Admitting: Gastroenterology

## 2021-12-17 VITALS — BP 128/66 | HR 76 | Ht 66.0 in | Wt 236.0 lb

## 2021-12-17 DIAGNOSIS — K5909 Other constipation: Secondary | ICD-10-CM

## 2021-12-17 DIAGNOSIS — Z1211 Encounter for screening for malignant neoplasm of colon: Secondary | ICD-10-CM | POA: Diagnosis not present

## 2021-12-17 DIAGNOSIS — K3184 Gastroparesis: Secondary | ICD-10-CM

## 2021-12-17 MED ORDER — METOCLOPRAMIDE HCL 5 MG PO TABS: 5.0000 mg | ORAL_TABLET | Freq: Three times a day (TID) | ORAL | 0 refills | Status: DC

## 2021-12-17 MED ORDER — ONDANSETRON 4 MG PO TBDP: 4.0000 mg | ORAL_TABLET | Freq: Three times a day (TID) | ORAL | 1 refills | Status: DC | PRN

## 2021-12-17 MED ORDER — MOTEGRITY 2 MG PO TABS: 2.0000 mg | ORAL_TABLET | Freq: Every day | ORAL | 0 refills | Status: DC

## 2021-12-17 NOTE — Progress Notes (Signed)
HPI :  51 year old female here for a follow-up visit for gastroparesis.  Recall she has a history of diabetes for many years, history of CVA on Plavix.  She has had upper tract symptoms bothering her for several months now.  She was seen in our office in August for some of these issues.  She has had early satiety, nausea with occasional vomiting.  Appetite has been poor, if she eats large meal at breakfast she will not want to eat the rest of the day.  She had an EGD back in 2020 which did not show any concerning problems.  She had been placed on Ozempic earlier this year, states her symptoms preceded this however definitely got worse while on the Ozempic.  Since holding it she has felt better but continues to have baseline symptoms that bother her.  She eats about 2 meals a day due to her symptoms.  Essentially continues to feel full easily and nauseated.  Does not have much reflux symptoms that bother her.  She does have epigastric fullness after she eats.  She is been taking Protonix 40 mg daily, no nausea medicines otherwise.  She stopped taking Pepcid.  She had a gastric emptying study in August that was markedly positive for gastroparesis.  Not sure when the timing of her last Ozempic dose was in regards to this finding.  She has not yet tried Reglan.  No recent antiemetics.  We do not have access to her last A1c on file.  Last EKG on file in August showed normal QTc  She otherwise complains of ongoing constipation.  She is having a bowel movement once every 3 days, states having hard time getting stool evacuated.  No blood in her stool.  She is using Metamucil Gummies which she does not thinks are helping too much.  She is tried MiraLAX as needed in the past but that has caused some nausea.  Her uncle had colon cancer but no other first-degree family history of colon cancer.  She had a colonoscopy in April 2020 when she presented with bleeding and abdominal pain.  Suspicious for ischemic colitis at  the time.  Poor prep on that exam.  Repeat colonoscopy attempted using double prep in June 2020, that again was a poor prep and could not complete the exam due to stool burden.  The inflammatory changes had intervally healed in the left colon.  We discussed if she wanted to have any further colon cancer screening.   Prior workup: EGD 04/29/18 - normal esophagus, mild gastritis, benign duodenal peptic duodenitis, H pylori negative     Colonoscopy 04/29/18 - - A large amount of liquid stool was found in the entire colon, making visualization difficult.- erythematous, inflamed and ulcerated mucosa was found in the descending colon, at the splenic flexure, in the mid transverse colon and in the distal transverse colon, mostly in a linear fashion. Gross appearance most concerning for ischemic colitis. Biopsies were taken with a cold forceps for histology.The exam was otherwise without abnormality. Cecal cap could not cleared due to stool. Several areas not well visualized due to stool burden, no obvious mass lesions. Path shows - nonspecific inflammation  Colonoscopy 07/09/18: The perianal and digital rectal examinations were normal. - A large amount of semi-solid stool was found in the transverse colon and in the ascending colon, making visualization difficult, could not visualize well enough to attempt cecal intubation. The previously noted inflammatory changes in the transverse colon had intervally resolved. - The rectum,  recto-sigmoid colon, sigmoid colon and descending colon appeared normal. No inflammatory changes, prep much better in this area likely due to recent enema. - The exam was otherwise normal throughout the examined colon.   GES 09/12/21: FINDINGS: Expected location of the stomach in the left upper quadrant. Ingested meal empties the stomach gradually over the course of the study.   44% emptied at 1 hr ( normal >= 10%)   48% emptied at 2 hr ( normal >= 40%)   49% emptied at  3 hr ( normal >= 70%)   64% emptied at 4 hr ( normal >= 90%)   IMPRESSION: Scintigraphic findings consistent with delayed gastric emptying.    Past Medical History:  Diagnosis Date   Allergy    Anemia    DURING MENSES--HAS HEAVY BLEEDING WITH PERODS   Angioedema 08/13/2020   Anxiety    Back pain, chronic    "ongoing"   Blood transfusion    IN 2012  AFTER C -SECTION   Cerebral thrombosis with cerebral infarction ([place]) 06/2009   RIGHT SIDED WEAKNESS ( ARM AND LEG ) AND SPASMS-remains with slight weakness and vertigo.   Constipation    Depression    Diabetes mellitus    Diabetic neuropathy (HCC)    BOTH FEET --COMES AND GOES   Edema, lower extremity    Endometriosis    Fatty liver    GERD (gastroesophageal reflux disease)    with pregnancy   H/O eye surgery    Headache(784.0)    MIGRAINES--NOT REALLY HEADACHE-MORE LIKE PRESSURE SENSATION IN HEAD-FEELS DIZZIY AND  FAINT AS THE PRESSURE RESOLVES   Hernia, incisional, RLQ, s/p lap repair Sep 2013 09/02/2011   History of vertigo 03/22/2018   Hx of migraines 10/19/2011   Hyperlipidemia    Hypertension    Leg pain, right    "like bad Charley horse"   Multiple food allergies    Panic disorder without agoraphobia    Rash    HANDS, ARMS --STATES HX OF RASH Tucker.  STATES THE RASH OFTEN OCCURS WHEN SHE IS REALLY STRESSED."goes and comes-presently left ring finger"   Restless leg syndrome    DIAGNOSED BY SLEEP STUDY - PT TOLD SHE DID NOT HAVE SLEEP APNEA   Right rotator cuff tear    PAIN IN RIGHT SHOULDER   SBO (small bowel obstruction) ([place]) 06/03/2012   Shortness of breath    Spastic hemiplegia affecting dominant side (HCC)    Stomach problems    Stroke (HCC)    Ventral hernia    RIGHT LOWER QUADRANT-CAUSING SOME PAIN   Weakness of right side of body      Past Surgical History:  Procedure Laterality Date   APPLICATION OF WOUND VAC N/A 05/25/2015   Procedure: APPLICATION OF WOUND VAC;   Surgeon: Michael Boston, MD;  Location: [facility];  Service: General;  Laterality: N/A;   CARDIAC CATHETERIZATION     CESAREAN SECTION  2012   COLONOSCOPY     CORONARY STENT INTERVENTION N/A 09/05/2020   Procedure: CORONARY STENT INTERVENTION;  Surgeon: Nigel Mormon, MD;  Location: [place] [facility];  Service: Cardiovascular;  Laterality: N/A;   DIAGNOSTIC LAPAROSCOPY     ESOPHAGOGASTRODUODENOSCOPY N/A 06/03/2012   Procedure: ESOPHAGOGASTRODUODENOSCOPY (EGD);  Surgeon: [name] Craver, MD;  Location: [facility];  Service: Endoscopy;  Laterality: N/A;   EXCISION MASS ABDOMINAL N/A 05/25/2015   Procedure: ABDOMINAL WALL EXPLORATION EXCISION OF SEROMA REMOVAL OF REDUNDANT SKIN ;  Surgeon: Michael Boston, MD;  Location:  [facility];  Service: General;  Laterality: N/A;   EYE SURGERY     Eye laser for vessel hemorrhaging   fybroid removal     HERNIA REPAIR  10/03/2011   ventral hernia repair   INSERTION OF MESH N/A 02/04/2013   Procedure: INSERTION OF MESH;  Surgeon: Adin Hector, MD;  Location: [facility];  Service: General;  Laterality: N/A;   INTRAVASCULAR ULTRASOUND/IVUS N/A 09/05/2020   Procedure: Intravascular Ultrasound/IVUS;  Surgeon: Nigel Mormon, MD;  Location: [place] [facility];  Service: Cardiovascular;  Laterality: N/A;   LEFT HEART CATH AND CORONARY ANGIOGRAPHY N/A 09/05/2020   Procedure: LEFT HEART CATH AND CORONARY ANGIOGRAPHY;  Surgeon: Nigel Mormon, MD;  Location: [place] [facility];  Service: Cardiovascular;  Laterality: N/A;   UMBILICAL HERNIA REPAIR N/A 02/04/2013   Procedure: LAPAROSCOPIC ventral wall hernia repair LAPAROSCOPIC LYSIS OF ADHESIONS laparoscopic exploration of abdomen ;  Surgeon: Adin Hector, MD;  Location: [facility];  Service: General;  Laterality: N/A;   UPPER GASTROINTESTINAL ENDOSCOPY     URETER REVISION     Bilateral "twisted"   UTERINE FIBROID SURGERY     2 SURGERIES FOR FIBROIDS   VENTRAL HERNIA REPAIR  10/03/2011   Procedure:  LAPAROSCOPIC VENTRAL HERNIA;  Surgeon: Adin Hector, MD;  Location: [facility];  Service: General;  Laterality: N/A;   Family History  Problem Relation Age of Onset   Diabetes Father    Kidney disease Father    Depression Father    Drug abuse Father    Allergic rhinitis Mother    Eczema Mother    Urticaria Mother    Depression Mother    Anxiety disorder Mother    Bipolar disorder Mother    Alcoholism Mother    Drug abuse Mother    Eating disorder Mother    Diabetes Maternal Grandmother    Hyperlipidemia Paternal Grandmother    Stroke Paternal Grandmother    Eczema Sister    Urticaria Sister    Colon cancer Paternal Uncle    Other Neg Hx    Angioedema Neg Hx    Asthma Neg Hx    Colon polyps Neg Hx    Esophageal cancer Neg Hx    Rectal cancer Neg Hx    Stomach cancer Neg Hx    Social History   Tobacco Use   Smoking status: Never   Smokeless tobacco: Never  Vaping Use   Vaping Use: Never used  Substance Use Topics   Alcohol use: No    Alcohol/week: 0.0 standard drinks of alcohol   Drug use: Not Currently   Current Outpatient Medications  Medication Sig Dispense Refill   albuterol (VENTOLIN HFA) 108 (90 Base) MCG/ACT inhaler Inhale 2 puffs into the lungs every 4 (four) hours as needed for wheezing or shortness of breath. 18 g 1   allopurinol (ZYLOPRIM) 100 MG tablet Take 1 tablet by mouth daily.     amLODipine (NORVASC) 5 MG tablet Take 1 tablet (5 mg total) by mouth daily. 90 tablet 0   aspirin EC 81 MG tablet Take 81 mg by mouth at bedtime. Swallow whole.     atorvastatin (LIPITOR) 40 MG tablet Take 1 tablet (40 mg total) by mouth at bedtime. 90 tablet 0   carvedilol (COREG) 25 MG tablet Take 1 tablet by mouth 2 (two) times daily with a meal.     cetirizine (ZYRTEC) 10 MG tablet Take 10 mg by mouth daily as needed for allergies (itching).  clopidogrel (PLAVIX) 75 MG tablet Take 1 tablet (75 mg total) by mouth daily. 90 tablet 3   colchicine 0.6 MG tablet Take 0.6  mg by mouth daily.     Continuous Blood Gluc Sensor (FREESTYLE LIBRE 2 SENSOR) MISC Inject 1 Device into the skin every 14 (fourteen) days.     DULoxetine (CYMBALTA) 20 MG capsule Take 20-40 mg by mouth See admin instructions. Take 20 mg by mouth in the morning and 40 mg at bedtime     empagliflozin (JARDIANCE) 25 MG TABS tablet 1 tablet     famotidine (PEPCID) 20 MG tablet Take 1 tablet by mouth 2 (two) times daily.     furosemide (LASIX) 40 MG tablet Take 40 mg by mouth in the morning.     gabapentin (NEURONTIN) 300 MG capsule Take 300 mg by mouth at bedtime.     hydrALAZINE (APRESOLINE) 10 MG tablet TAKE ONE TABLET BY MOUTH THREE TIMES DAILY 180 tablet 3   Insulin Aspart FlexPen (NOVOLOG) 100 UNIT/ML Inject 0-25 Units into the skin 3 (three) times daily before meals. Dose per sliding scale.     Insulin Glargine (BASAGLAR KWIKPEN) 100 UNIT/ML Inject 22 Units into the skin at bedtime.     isosorbide mononitrate (IMDUR) 60 MG 24 hr tablet TAKE 1 TABLET BY MOUTH DAILY 30 tablet 10   metoCLOPramide (REGLAN) 5 MG tablet Take 1 tablet (5 mg total) by mouth 3 (three) times daily before meals. 90 tablet 0   ondansetron (ZOFRAN-ODT) 4 MG disintegrating tablet Take 1 tablet (4 mg total) by mouth every 8 (eight) hours as needed for nausea or vomiting. 30 tablet 1   pantoprazole (PROTONIX) 40 MG tablet TAKE ONE TABLET BY MOUTH ONCE DAILY 30 tablet 5   Prucalopride Succinate (MOTEGRITY) 2 MG TABS Take 1 tablet (2 mg total) by mouth daily. Lot: [id number], ExP: 10-2023 7 tablet 0   spironolactone (ALDACTONE) 25 MG tablet Take 25 mg by mouth daily.     tiZANidine (ZANAFLEX) 2 MG tablet Take 1 tablet (2 mg total) by mouth at bedtime. 90 tablet 1   traMADol (ULTRAM) 50 MG tablet Take 1 tablet by mouth as needed.     EPINEPHrine (AUVI-Q) 0.3 mg/0.3 mL IJ SOAJ injection Inject 0.3 mg into the muscle as needed for anaphylaxis. (Patient not taking: Reported on 12/17/2021) 1 each 1   Vitamin D, Cholecalciferol, 25 MCG  (1000 UT) CAPS 1 capsule     No current facility-administered medications for this visit.   Allergies  Allergen Reactions   Amlodipine Benzoate Swelling and Other (See Comments)    Leg swelling Other reaction(s): leg swelling Other reaction(s): ? leg swelling at 10 mg dose Other reaction(s): ? leg swelling at 10 mg dose Other reaction(s): ? leg swelling at 10 mg dose   Contrast Media [Iodinated Contrast Media] Shortness Of Breath and Other (See Comments)    Difficulty breathing   Iodine Anaphylaxis    Other reaction(s): Unknown Other reaction(s): Unknown Other reaction(s): Unknown Other reaction(s): Unknown   Iohexol Hives, Nausea And Vomiting, Swelling and Other (See Comments)     Desc: Magnevist-gadolinium-difficulty breathing, throat swelling    Midazolam Hcl Anaphylaxis    Difficulty breathing   Other Shortness Of Breath, Itching and Other (See Comments)    Patient is allergic to all nuts except peanuts, which are NOT "nuts"- they are legumes   Shellfish Allergy Anaphylaxis   Shellfish-Derived Products Anaphylaxis and Other (See Comments)    Other reaction(s): Unknown   Valsartan  Swelling    Other reaction(s): Unknown Other reaction(s): Unknown Other reaction(s): angioedema Other reaction(s): angioedema   Metformin And Related Diarrhea, Nausea And Vomiting and Other (See Comments)    Dehydration, also   [place] [Dapagliflozin] Nausea Only and Other (See Comments)    UTI, headaches and muscle aches, also   Hydralazine Other (See Comments)    Reaction not recalled Other reaction(s): Unknown Other reaction(s): Unknown Other reaction(s): Unknown Other reaction(s): Unknown   Saxagliptin Hives, Nausea And Vomiting and Other (See Comments)    ONGLYZA- dehydration, also Other reaction(s): Unknown Other reaction(s): Unknown Other reaction(s): Unknown Other reaction(s): Unknown   Spironolactone Other (See Comments)    Elevated potassium- Severe hyperkalemia Other  reaction(s): severe hyperkalemia Other reaction(s): severe hyperkalemia Other reaction(s): severe hyperkalemia   Avandia [Rosiglitazone Maleate] Hives and Other (See Comments)   Geodon [Ziprasidone] Other (See Comments)    Reaction not recalled   Kiwi Extract Itching and Swelling   Latex Itching    Other reaction(s): Unknown Other reaction(s): Unknown     Review of Systems: All systems reviewed and negative except where noted in HPI.   Lab Results  Component Value Date   WBC 8.8 06/05/2021   HGB 14.5 06/05/2021   HCT 43.8 06/05/2021   MCV 83.0 06/05/2021   PLT 342 06/05/2021    Lab Results  Component Value Date   CREATININE 1.06 (H) 06/05/2021   BUN 9 06/05/2021   NA 140 06/05/2021   K 3.7 06/05/2021   CL 107 06/05/2021   CO2 24 06/05/2021    Lab Results  Component Value Date   ALT 15 06/05/2021   AST 18 06/05/2021   ALKPHOS 49 06/05/2021   BILITOT 0.9 06/05/2021     Physical Exam: BP 128/66   Pulse 76   Ht '5\' 6"'$  (1.676 m)   Wt 236 lb (107 kg)   BMI 38.09 kg/m  Constitutional: Pleasant,well-developed, female in no acute distress. Skin: Skin is warm and dry. No rashes noted. Psychiatric: Normal mood and affect. Behavior is normal.   ASSESSMENT: 51 y.o. female here for assessment of the following  1. Gastroparesis   2. Chronic constipation   3. Colon cancer screening    As above, likely has underlying gastroparesis causing her symptoms.  Her EGD looked okay.  Clearly Ozempic made things worse and she held that, gastric emptying study markedly positive although Ozempic could have influence that depending on the timing of when she last took it, which is unclear to me.  That being said clinically her symptoms are very consistent with that of gastroparesis.  We discussed options and how to treat this.  Provided her some handouts on gastroparesis diet, if she is having worsening symptoms she should go to liquid diet as needed.  Ultimately discussed medical  therapy, discussed Reglan with her, she has not tried this.  Her QTc on EKG looks okay.  Risks of Reglan use to include tardive dyskinesia, very rare at low doses, etc.  She wants to try Reglan 5 mg twice daily to 3 times daily prior to meals to see if that will help, 4-week trial given.  We will also give her some Zofran to use as needed for nausea.  She will stay off Ozempic.  She needs to follow-up with her primary care doctor for diabetes management.  Otherwise we discussed her constipation.  This is been persistent over time.  She does not tolerate MiraLAX due to nausea, Metamucil not helping.  Discussed options with her.  I think Motegrity would be a good option for her if it is covered by insurance given this may help her gastroparesis as well.  I gave her some samples of this for free to try for a week.  If this works for her she will let me know we will write her a prescription for this.  If it does not help, she will contact me and we can try something else.  Otherwise she has had a failed colonoscopy despite double prep due to constipation.  We discussed options for colon cancer screening.  Ideally would do a colonoscopy but that will be difficult for her to prepare for.  We discussed doing a Cologuard versus virtual colonoscopy as alternatives.  Following discussion of these she wants to do a Cologuard.  She understands if this is positive we will need to sort out how to get her ready for colonoscopy, could be 2-day prep with multiple preps.   PLAN: - start Reglan '5mg'$  TID for 1 month trial - Zofran '4mg'$  ODT every 8 hours PRN  - stay off Ozempic - gastroparesis dietary advice / handouts given - follow up with PCP for DM management - samples of Motegrity - '2mg'$  once daily - if works will try to get prescription - ordered Cologuard for CRC screening - follow up in 3 months  Jolly Mango, MD [facility]

## 2021-12-17 NOTE — Patient Instructions (Addendum)
If you are age 52 or older, your body mass index should be between 23-30. Your Body mass index is 38.09 kg/m. If this is out of the aforementioned range listed, please consider follow up with your Primary Care Provider.  If you are age 44 or younger, your body mass index should be between 19-25. Your Body mass index is 38.09 kg/m. If this is out of the aformentioned range listed, please consider follow up with your Primary Care Provider.   ________________________________________________________  Your provider has ordered Cologuard testing as an option for colon cancer screening. This is performed by [employer] and may be out of network with your insurance. PRIOR to completing the test, it is YOUR responsibility to contact your insurance about covered benefits for this test. Your out of pocket expense could be anywhere from $0.00 to $649.00.   When you call to check coverage with your insurer, please provide the following information:   -The ONLY provider of Cologuard is [place] code for Cologuard is [phone].  [employer] NPI # [id number]  -[employer] Tax ID # [id number]   We have already sent your demographic and insurance information to [employer] (phone number [phone]) and they should contact you within the next week regarding your test. If you have not heard from them within the next week, please call our office at [phone]. ___________________________________________________  We have sent the following medications to your pharmacy for you to pick up at your convenience: Zofran 4 mg ODT:  dissolve 1 tablet orally every 8 hours as needed Reglan 5 mg: Take three times a day before meals  We have given you samples of the following medication to take: Motegrity 2 mg: Take once daily  We are giving you a Gastroparesis diet handout today.  Please follow up in March.  Please call in a month or two to schedule an  appointment.  Thank you for entrusting me with your care and for choosing [facility], Dr. Manchester Cellar

## 2021-12-19 DIAGNOSIS — E11319 Type 2 diabetes mellitus with unspecified diabetic retinopathy without macular edema: Secondary | ICD-10-CM | POA: Diagnosis not present

## 2021-12-19 DIAGNOSIS — E1122 Type 2 diabetes mellitus with diabetic chronic kidney disease: Secondary | ICD-10-CM | POA: Diagnosis not present

## 2021-12-19 DIAGNOSIS — E1140 Type 2 diabetes mellitus with diabetic neuropathy, unspecified: Secondary | ICD-10-CM | POA: Diagnosis not present

## 2021-12-19 DIAGNOSIS — E785 Hyperlipidemia, unspecified: Secondary | ICD-10-CM | POA: Diagnosis not present

## 2021-12-26 DIAGNOSIS — H31093 Other chorioretinal scars, bilateral: Secondary | ICD-10-CM | POA: Diagnosis not present

## 2021-12-26 DIAGNOSIS — H3582 Retinal ischemia: Secondary | ICD-10-CM | POA: Diagnosis not present

## 2021-12-26 DIAGNOSIS — E113592 Type 2 diabetes mellitus with proliferative diabetic retinopathy without macular edema, left eye: Secondary | ICD-10-CM | POA: Diagnosis not present

## 2021-12-26 DIAGNOSIS — E113511 Type 2 diabetes mellitus with proliferative diabetic retinopathy with macular edema, right eye: Secondary | ICD-10-CM | POA: Diagnosis not present

## 2022-01-02 DIAGNOSIS — E1165 Type 2 diabetes mellitus with hyperglycemia: Secondary | ICD-10-CM | POA: Diagnosis not present

## 2022-01-02 DIAGNOSIS — E1122 Type 2 diabetes mellitus with diabetic chronic kidney disease: Secondary | ICD-10-CM | POA: Diagnosis not present

## 2022-01-02 DIAGNOSIS — E1169 Type 2 diabetes mellitus with other specified complication: Secondary | ICD-10-CM | POA: Diagnosis not present

## 2022-01-07 ENCOUNTER — Encounter: Payer: PPO | Attending: Physical Medicine & Rehabilitation | Admitting: Physical Medicine & Rehabilitation

## 2022-01-07 DIAGNOSIS — M7061 Trochanteric bursitis, right hip: Secondary | ICD-10-CM | POA: Insufficient documentation

## 2022-01-07 DIAGNOSIS — Z79891 Long term (current) use of opiate analgesic: Secondary | ICD-10-CM | POA: Insufficient documentation

## 2022-01-07 DIAGNOSIS — Z5181 Encounter for therapeutic drug level monitoring: Secondary | ICD-10-CM | POA: Insufficient documentation

## 2022-01-07 DIAGNOSIS — G894 Chronic pain syndrome: Secondary | ICD-10-CM | POA: Insufficient documentation

## 2022-01-20 ENCOUNTER — Other Ambulatory Visit: Payer: Self-pay | Admitting: Physical Medicine & Rehabilitation

## 2022-01-20 ENCOUNTER — Other Ambulatory Visit: Payer: Self-pay | Admitting: Cardiology

## 2022-02-02 DIAGNOSIS — E1169 Type 2 diabetes mellitus with other specified complication: Secondary | ICD-10-CM | POA: Diagnosis not present

## 2022-02-02 DIAGNOSIS — E1122 Type 2 diabetes mellitus with diabetic chronic kidney disease: Secondary | ICD-10-CM | POA: Diagnosis not present

## 2022-02-02 DIAGNOSIS — E1165 Type 2 diabetes mellitus with hyperglycemia: Secondary | ICD-10-CM | POA: Diagnosis not present

## 2022-02-04 DIAGNOSIS — Z79899 Other long term (current) drug therapy: Secondary | ICD-10-CM | POA: Diagnosis not present

## 2022-02-04 DIAGNOSIS — Z0000 Encounter for general adult medical examination without abnormal findings: Secondary | ICD-10-CM | POA: Diagnosis not present

## 2022-02-10 DIAGNOSIS — E785 Hyperlipidemia, unspecified: Secondary | ICD-10-CM | POA: Diagnosis not present

## 2022-02-10 DIAGNOSIS — I10 Essential (primary) hypertension: Secondary | ICD-10-CM | POA: Diagnosis not present

## 2022-02-10 DIAGNOSIS — E1122 Type 2 diabetes mellitus with diabetic chronic kidney disease: Secondary | ICD-10-CM | POA: Diagnosis not present

## 2022-02-10 DIAGNOSIS — E11319 Type 2 diabetes mellitus with unspecified diabetic retinopathy without macular edema: Secondary | ICD-10-CM | POA: Diagnosis not present

## 2022-02-12 DIAGNOSIS — G4733 Obstructive sleep apnea (adult) (pediatric): Secondary | ICD-10-CM | POA: Diagnosis not present

## 2022-02-19 ENCOUNTER — Ambulatory Visit: Payer: PPO | Admitting: Internal Medicine

## 2022-02-19 DIAGNOSIS — N1831 Chronic kidney disease, stage 3a: Secondary | ICD-10-CM | POA: Diagnosis not present

## 2022-02-19 DIAGNOSIS — I2510 Atherosclerotic heart disease of native coronary artery without angina pectoris: Secondary | ICD-10-CM | POA: Diagnosis not present

## 2022-02-19 DIAGNOSIS — I129 Hypertensive chronic kidney disease with stage 1 through stage 4 chronic kidney disease, or unspecified chronic kidney disease: Secondary | ICD-10-CM | POA: Diagnosis not present

## 2022-02-19 DIAGNOSIS — E785 Hyperlipidemia, unspecified: Secondary | ICD-10-CM | POA: Diagnosis not present

## 2022-02-19 DIAGNOSIS — I639 Cerebral infarction, unspecified: Secondary | ICD-10-CM | POA: Diagnosis not present

## 2022-02-19 DIAGNOSIS — E1122 Type 2 diabetes mellitus with diabetic chronic kidney disease: Secondary | ICD-10-CM | POA: Diagnosis not present

## 2022-02-19 DIAGNOSIS — R809 Proteinuria, unspecified: Secondary | ICD-10-CM | POA: Diagnosis not present

## 2022-02-21 DIAGNOSIS — E113511 Type 2 diabetes mellitus with proliferative diabetic retinopathy with macular edema, right eye: Secondary | ICD-10-CM | POA: Diagnosis not present

## 2022-02-27 DIAGNOSIS — N1832 Chronic kidney disease, stage 3b: Secondary | ICD-10-CM | POA: Diagnosis not present

## 2022-02-27 DIAGNOSIS — Z794 Long term (current) use of insulin: Secondary | ICD-10-CM | POA: Diagnosis not present

## 2022-02-27 DIAGNOSIS — E1122 Type 2 diabetes mellitus with diabetic chronic kidney disease: Secondary | ICD-10-CM | POA: Diagnosis not present

## 2022-03-02 ENCOUNTER — Encounter: Payer: Self-pay | Admitting: Gastroenterology

## 2022-03-05 DIAGNOSIS — E1165 Type 2 diabetes mellitus with hyperglycemia: Secondary | ICD-10-CM | POA: Diagnosis not present

## 2022-03-05 DIAGNOSIS — E1122 Type 2 diabetes mellitus with diabetic chronic kidney disease: Secondary | ICD-10-CM | POA: Diagnosis not present

## 2022-03-05 DIAGNOSIS — E1169 Type 2 diabetes mellitus with other specified complication: Secondary | ICD-10-CM | POA: Diagnosis not present

## 2022-03-10 DIAGNOSIS — E1122 Type 2 diabetes mellitus with diabetic chronic kidney disease: Secondary | ICD-10-CM | POA: Diagnosis not present

## 2022-03-10 DIAGNOSIS — I10 Essential (primary) hypertension: Secondary | ICD-10-CM | POA: Diagnosis not present

## 2022-03-10 DIAGNOSIS — N1832 Chronic kidney disease, stage 3b: Secondary | ICD-10-CM | POA: Diagnosis not present

## 2022-03-10 DIAGNOSIS — I252 Old myocardial infarction: Secondary | ICD-10-CM | POA: Diagnosis not present

## 2022-03-10 DIAGNOSIS — E1165 Type 2 diabetes mellitus with hyperglycemia: Secondary | ICD-10-CM | POA: Diagnosis not present

## 2022-03-10 DIAGNOSIS — E7800 Pure hypercholesterolemia, unspecified: Secondary | ICD-10-CM | POA: Diagnosis not present

## 2022-03-12 NOTE — Patient Instructions (Incomplete)
Please continue using your CPAP regularly. While your insurance requires that you use CPAP at least 4 hours each night on 70% of the nights, I recommend, that you not skip any nights and use it throughout the night if you can. Getting used to CPAP and staying with the treatment long term does take time and patience and discipline. Untreated obstructive sleep apnea when it is moderate to severe can have an adverse impact on cardiovascular health and raise her risk for heart disease, arrhythmias, hypertension, congestive heart failure, stroke and diabetes. Untreated obstructive sleep apnea causes sleep disruption, nonrestorative sleep, and sleep deprivation. This can have an impact on your day to day functioning and cause daytime sleepiness and impairment of cognitive function, memory loss, mood disturbance, and problems focussing. Using CPAP regularly can improve these symptoms. ° ° °

## 2022-03-12 NOTE — Progress Notes (Deleted)
PATIENT: Tina [name] DOB: 1970/03/11  REASON FOR VISIT: follow up HISTORY FROM: patient  No chief complaint on file.    HISTORY OF PRESENT ILLNESS:  03/12/22 ALL:  Tina [name] is a 52 y.o. female here today for follow up for OSA on CPAP.     HISTORY: (copied from Dr Dohmeier's previous note)  Tina [name] is a 52 y.o. year old 47 or [place] American female  patient seen here in a Rv  09/04/2021 . Here for yearly CPAP f/u. Pt reports misplacing her CPAP when relocating. Has not used it in a while. Has recently ordered a new mask last week and will restart as soon as it comes in.    She has not been using her CPAP after it was displaced in a move in February 2023, and she is restarting. Recent data not available.  She needs a new FFm, hose and water chamber.  She will restart and follow up with Np in 6 months time. Has lost  36 pounds.  She saw Dr Terri Skains, [facility] for CAD, and Dr Zadie Rhine for diabetic eye disease.  Non compliance has been a problem in her recent past    REVIEW OF SYSTEMS: Out of a complete 14 system review of symptoms, the patient complains only of the following symptoms, and all other reviewed systems are negative.  ESS:  ALLERGIES: Allergies  Allergen Reactions   Amlodipine Benzoate Swelling and Other (See Comments)    Leg swelling Other reaction(s): leg swelling Other reaction(s): ? leg swelling at 10 mg dose Other reaction(s): ? leg swelling at 10 mg dose Other reaction(s): ? leg swelling at 10 mg dose   Contrast Media [Iodinated Contrast Media] Shortness Of Breath and Other (See Comments)    Difficulty breathing   Iodine Anaphylaxis    Other reaction(s): Unknown Other reaction(s): Unknown Other reaction(s): Unknown Other reaction(s): Unknown   Iohexol Hives, Nausea And Vomiting, Swelling and Other (See Comments)     Desc: Magnevist-gadolinium-difficulty breathing, throat swelling    Midazolam Hcl Anaphylaxis    Difficulty  breathing   Other Shortness Of Breath, Itching and Other (See Comments)    Patient is allergic to all nuts except peanuts, which are NOT "nuts"- they are legumes   Shellfish Allergy Anaphylaxis   Shellfish-Derived Products Anaphylaxis and Other (See Comments)    Other reaction(s): Unknown   Valsartan Swelling    Other reaction(s): Unknown Other reaction(s): Unknown Other reaction(s): angioedema Other reaction(s): angioedema   Metformin And Related Diarrhea, Nausea And Vomiting and Other (See Comments)    Dehydration, also   [place] [Dapagliflozin] Nausea Only and Other (See Comments)    UTI, headaches and muscle aches, also   Hydralazine Other (See Comments)    Reaction not recalled Other reaction(s): Unknown Other reaction(s): Unknown Other reaction(s): Unknown Other reaction(s): Unknown   Saxagliptin Hives, Nausea And Vomiting and Other (See Comments)    ONGLYZA- dehydration, also Other reaction(s): Unknown Other reaction(s): Unknown Other reaction(s): Unknown Other reaction(s): Unknown   Spironolactone Other (See Comments)    Elevated potassium- Severe hyperkalemia Other reaction(s): severe hyperkalemia Other reaction(s): severe hyperkalemia Other reaction(s): severe hyperkalemia   Avandia [Rosiglitazone Maleate] Hives and Other (See Comments)   Geodon [Ziprasidone] Other (See Comments)    Reaction not recalled   Kiwi Extract Itching and Swelling   Latex Itching    Other reaction(s): Unknown Other reaction(s): Unknown    HOME MEDICATIONS: Outpatient Medications Prior to Visit  Medication Sig Dispense Refill  albuterol (VENTOLIN HFA) 108 (90 Base) MCG/ACT inhaler Inhale 2 puffs into the lungs every 4 (four) hours as needed for wheezing or shortness of breath. 18 g 1   allopurinol (ZYLOPRIM) 100 MG tablet Take 1 tablet by mouth daily.     amLODipine (NORVASC) 5 MG tablet Take 1 tablet (5 mg total) by mouth daily. 90 tablet 0   aspirin EC 81 MG tablet Take 81 mg by  mouth at bedtime. Swallow whole.     atorvastatin (LIPITOR) 40 MG tablet Take 1 tablet (40 mg total) by mouth at bedtime. 90 tablet 0   carvedilol (COREG) 25 MG tablet Take 1 tablet by mouth 2 (two) times daily with a meal.     cetirizine (ZYRTEC) 10 MG tablet Take 10 mg by mouth daily as needed for allergies (itching).     clopidogrel (PLAVIX) 75 MG tablet Take 1 tablet (75 mg total) by mouth daily. 90 tablet 3   colchicine 0.6 MG tablet Take 0.6 mg by mouth daily.     Continuous Blood Gluc Sensor (FREESTYLE LIBRE 2 SENSOR) MISC Inject 1 Device into the skin every 14 (fourteen) days.     DULoxetine (CYMBALTA) 20 MG capsule Take 20-40 mg by mouth See admin instructions. Take 20 mg by mouth in the morning and 40 mg at bedtime     empagliflozin (JARDIANCE) 25 MG TABS tablet 1 tablet     EPINEPHrine (AUVI-Q) 0.3 mg/0.3 mL IJ SOAJ injection Inject 0.3 mg into the muscle as needed for anaphylaxis. (Patient not taking: Reported on 12/17/2021) 1 each 1   famotidine (PEPCID) 20 MG tablet Take 1 tablet by mouth 2 (two) times daily.     furosemide (LASIX) 40 MG tablet Take 40 mg by mouth in the morning.     gabapentin (NEURONTIN) 300 MG capsule Take 300 mg by mouth at bedtime.     hydrALAZINE (APRESOLINE) 10 MG tablet TAKE ONE TABLET BY MOUTH THREE TIMES DAILY 180 tablet 3   Insulin Aspart FlexPen (NOVOLOG) 100 UNIT/ML Inject 0-25 Units into the skin 3 (three) times daily before meals. Dose per sliding scale.     Insulin Glargine (BASAGLAR KWIKPEN) 100 UNIT/ML Inject 22 Units into the skin at bedtime.     isosorbide mononitrate (IMDUR) 60 MG 24 hr tablet TAKE ONE TABLET BY MOUTH ONCE DAILY 90 tablet 3   metoCLOPramide (REGLAN) 5 MG tablet Take 1 tablet (5 mg total) by mouth 3 (three) times daily before meals. 90 tablet 0   ondansetron (ZOFRAN-ODT) 4 MG disintegrating tablet Take 1 tablet (4 mg total) by mouth every 8 (eight) hours as needed for nausea or vomiting. 30 tablet 1   pantoprazole (PROTONIX) 40 MG  tablet TAKE ONE TABLET BY MOUTH ONCE DAILY 30 tablet 5   Prucalopride Succinate (MOTEGRITY) 2 MG TABS Take 1 tablet (2 mg total) by mouth daily. Lot: [id number], ExP: 10-2023 7 tablet 0   spironolactone (ALDACTONE) 25 MG tablet Take 25 mg by mouth daily.     tiZANidine (ZANAFLEX) 2 MG tablet TAKE ONE TABLET BY MOUTH EVERYDAY AT BEDTIME 90 tablet 1   traMADol (ULTRAM) 50 MG tablet Take 1 tablet by mouth as needed.     Vitamin D, Cholecalciferol, 25 MCG (1000 UT) CAPS 1 capsule     No facility-administered medications prior to visit.    PAST MEDICAL HISTORY: Past Medical History:  Diagnosis Date   Allergy    Anemia    DURING MENSES--HAS HEAVY BLEEDING WITH PERODS   Angioedema  08/13/2020   Anxiety    Back pain, chronic    "ongoing"   Blood transfusion    IN 2012  AFTER C -SECTION   Cerebral thrombosis with cerebral infarction ([place]) 06/2009   RIGHT SIDED WEAKNESS ( ARM AND LEG ) AND SPASMS-remains with slight weakness and vertigo.   Constipation    Depression    Diabetes mellitus    Diabetic neuropathy (HCC)    BOTH FEET --COMES AND GOES   Edema, lower extremity    Endometriosis    Fatty liver    GERD (gastroesophageal reflux disease)    with pregnancy   H/O eye surgery    Headache(784.0)    MIGRAINES--NOT REALLY HEADACHE-MORE LIKE PRESSURE SENSATION IN HEAD-FEELS DIZZIY AND  FAINT AS THE PRESSURE RESOLVES   Hernia, incisional, RLQ, s/p lap repair Sep 2013 09/02/2011   History of vertigo 03/22/2018   Hx of migraines 10/19/2011   Hyperlipidemia    Hypertension    Leg pain, right    "like bad Charley horse"   Multiple food allergies    Panic disorder without agoraphobia    Rash    HANDS, ARMS --STATES HX OF RASH [place].  STATES THE RASH OFTEN OCCURS WHEN SHE IS REALLY STRESSED."goes and comes-presently left ring finger"   Restless leg syndrome    DIAGNOSED BY SLEEP STUDY - PT TOLD SHE DID NOT HAVE SLEEP APNEA   Right rotator cuff tear    PAIN IN  RIGHT SHOULDER   SBO (small bowel obstruction) ([place]) 06/03/2012   Shortness of breath    Spastic hemiplegia affecting dominant side (HCC)    Stomach problems    Stroke (HCC)    Ventral hernia    RIGHT LOWER QUADRANT-CAUSING SOME PAIN   Weakness of right side of body     PAST SURGICAL HISTORY: Past Surgical History:  Procedure Laterality Date   APPLICATION OF WOUND VAC N/A 05/25/2015   Procedure: APPLICATION OF WOUND VAC;  Surgeon: Michael Boston, MD;  Location: [facility];  Service: General;  Laterality: N/A;   CARDIAC CATHETERIZATION     CESAREAN SECTION  2012   COLONOSCOPY     CORONARY STENT INTERVENTION N/A 09/05/2020   Procedure: CORONARY STENT INTERVENTION;  Surgeon: Nigel Mormon, MD;  Location: [place] [facility];  Service: Cardiovascular;  Laterality: N/A;   DIAGNOSTIC LAPAROSCOPY     ESOPHAGOGASTRODUODENOSCOPY N/A 06/03/2012   Procedure: ESOPHAGOGASTRODUODENOSCOPY (EGD);  Surgeon: [name] Craver, MD;  Location[facility];  Service: Endoscopy;  Laterality: N/A;   EXCISION MASS ABDOMINAL N/A 05/25/2015   Procedure: ABDOMINAL WALL EXPLORATION EXCISION OF SEROMA REMOVAL OF REDUNDANT SKIN ;  Surgeon: Michael Boston, MD;  Location: [facility];  Service: General;  Laterality: N/A;   EYE SURGERY     Eye laser for vessel hemorrhaging   fybroid removal     HERNIA REPAIR  10/03/2011   ventral hernia repair   INSERTION OF MESH N/A 02/04/2013   Procedure: INSERTION OF MESH;  Surgeon: Adin Hector, MD;  Location: [facility];  Service: General;  Laterality: N/A;   INTRAVASCULAR ULTRASOUND/IVUS N/A 09/05/2020   Procedure: Intravascular Ultrasound/IVUS;  Surgeon: Nigel Mormon, MD;  Location: [place] [facility];  Service: Cardiovascular;  Laterality: N/A;   LEFT HEART CATH AND CORONARY ANGIOGRAPHY N/A 09/05/2020   Procedure: LEFT HEART CATH AND CORONARY ANGIOGRAPHY;  Surgeon: Nigel Mormon, MD;  Location: [place] [facility];  Service: Cardiovascular;  Laterality: N/A;   UMBILICAL  HERNIA REPAIR N/A 02/04/2013  Procedure: LAPAROSCOPIC ventral wall hernia repair LAPAROSCOPIC LYSIS OF ADHESIONS laparoscopic exploration of abdomen ;  Surgeon: Adin Hector, MD;  Location: [facility];  Service: General;  Laterality: N/A;   UPPER GASTROINTESTINAL ENDOSCOPY     URETER REVISION     Bilateral "twisted"   UTERINE FIBROID SURGERY     2 SURGERIES FOR FIBROIDS   VENTRAL HERNIA REPAIR  10/03/2011   Procedure: LAPAROSCOPIC VENTRAL HERNIA;  Surgeon: Adin Hector, MD;  Location: [facility];  Service: General;  Laterality: N/A;    FAMILY HISTORY: Family History  Problem Relation Age of Onset   Diabetes Father    Kidney disease Father    Depression Father    Drug abuse Father    Allergic rhinitis Mother    Eczema Mother    Urticaria Mother    Depression Mother    Anxiety disorder Mother    Bipolar disorder Mother    Alcoholism Mother    Drug abuse Mother    Eating disorder Mother    Diabetes Maternal Grandmother    Hyperlipidemia Paternal Grandmother    Stroke Paternal Grandmother    Eczema Sister    Urticaria Sister    Colon cancer Paternal Uncle    Other Neg Hx    Angioedema Neg Hx    Asthma Neg Hx    Colon polyps Neg Hx    Esophageal cancer Neg Hx    Rectal cancer Neg Hx    Stomach cancer Neg Hx     SOCIAL HISTORY: Social History   Socioeconomic History   Marital status: Married    Spouse name: Annie Main   Number of children: 3   Years of education: BA degree   Highest education level: Not on file  Occupational History   Occupation: stay at home mom    Employer: UNEMPLOYED  Tobacco Use   Smoking status: Never   Smokeless tobacco: Never  Vaping Use   Vaping Use: Never used  Substance and Sexual Activity   Alcohol use: No    Alcohol/week: 0.0 standard drinks of alcohol   Drug use: Not Currently   Sexual activity: Yes    Birth control/protection: None  Other Topics Concern   Not on file  Social History Narrative   Patient is married with 2  children.   Patient is right handed.   Patient has her  BA degree.   Patient drinks 1 cup daily.   Social Determinants of Health   Financial Resource Strain: Not on file  Food Insecurity: No Food Insecurity (07/27/2019)   Hunger Vital Sign    Worried About Running Out of Food in the Last Year: Never true    Ran Out of Food in the Last Year: Never true  Transportation Needs: No Transportation Needs (07/27/2019)   PRAPARE - Hydrologist (Medical): No    Lack of Transportation (Non-Medical): No  Physical Activity: Not on file  Stress: Not on file  Social Connections: Not on file  Intimate Partner Violence: Not on file     PHYSICAL EXAM  There were no vitals filed for this visit. There is no height or weight on file to calculate BMI.  Generalized: Well developed, in no acute distress  Cardiology: normal rate and rhythm, no murmur noted Respiratory: clear to auscultation bilaterally  Neurological examination  Mentation: Alert oriented to time, place, history taking. Follows all commands speech and language fluent Cranial nerve II-XII: Pupils were equal round reactive to light. Extraocular movements  were full, visual field were full  Motor: The motor testing reveals 5 over 5 strength of all 4 extremities. Good symmetric motor tone is noted throughout.  Gait and station: Gait is normal.    DIAGNOSTIC DATA (LABS, IMAGING, TESTING) - I reviewed patient records, labs, notes, testing and imaging myself where available.      No data to display           Lab Results  Component Value Date   WBC 8.8 06/05/2021   HGB 14.5 06/05/2021   HCT 43.8 06/05/2021   MCV 83.0 06/05/2021   PLT 342 06/05/2021      Component Value Date/Time   NA 140 06/05/2021 1113   NA 140 11/19/2020 1635   K 3.7 06/05/2021 1113   CL 107 06/05/2021 1113   CO2 24 06/05/2021 1113   GLUCOSE 103 (H) 06/05/2021 1113   BUN 9 06/05/2021 1113   BUN 19 11/19/2020 1635    CREATININE 1.06 (H) 06/05/2021 1113   CALCIUM 8.9 06/05/2021 1113   PROT 6.8 06/05/2021 1113   PROT 6.4 04/12/2019 1313   ALBUMIN 3.2 (L) 06/05/2021 1113   ALBUMIN 3.7 (L) 04/12/2019 1313   AST 18 06/05/2021 1113   ALT 15 06/05/2021 1113   ALKPHOS 49 06/05/2021 1113   BILITOT 0.9 06/05/2021 1113   BILITOT <0.2 04/12/2019 1313   GFRNONAA >60 06/05/2021 1113   GFRAA 68 08/02/2019 0000   Lab Results  Component Value Date   CHOL 175 09/06/2020   HDL 56 09/06/2020   LDLCALC 92 09/06/2020   TRIG 137 09/06/2020   CHOLHDL 3.1 09/06/2020   Lab Results  Component Value Date   HGBA1C 8.4 (H) 08/13/2020   Lab Results  Component Value Date   VITAMINB12 328 04/12/2019   Lab Results  Component Value Date   TSH 1.100 04/12/2019     ASSESSMENT AND PLAN 52 y.o. year old female  has a past medical history of Allergy, Anemia, Angioedema (08/13/2020), Anxiety, Back pain, chronic, Blood transfusion, Cerebral thrombosis with cerebral infarction ([place]) (06/2009), Constipation, Depression, Diabetes mellitus, Diabetic neuropathy ([place]), Edema, lower extremity, Endometriosis, Fatty liver, GERD (gastroesophageal reflux disease), H/O eye surgery, Headache(784.0), Hernia, incisional, RLQ, s/p lap repair Sep 2013 (09/02/2011), History of vertigo (03/22/2018), migraines (10/19/2011), Hyperlipidemia, Hypertension, Leg pain, right, Multiple food allergies, Panic disorder without agoraphobia, Rash, Restless leg syndrome, Right rotator cuff tear, SBO (small bowel obstruction) ([place]) (06/03/2012), Shortness of breath, Spastic hemiplegia affecting dominant side ([place]), Stomach problems, Stroke ([place]), Ventral hernia, and Weakness of right side of body. here with   No diagnosis found.    Elizabth IMANIE DASHNAW is doing well on CPAP therapy. Compliance report reveals ***. *** was encouraged to continue using CPAP nightly and for greater than 4 hours each night. We will update supply orders as indicated. Risks of untreated  sleep apnea review and education materials provided. Healthy lifestyle habits encouraged. *** will follow up in ***, sooner if needed. *** verbalizes understanding and agreement with this plan.    No orders of the defined types were placed in this encounter.    No orders of the defined types were placed in this encounter.     Debbora Presto, FNP-C 03/12/2022, 5:08 PM [facility] [address], [place], [place] [address] [phone]

## 2022-03-13 ENCOUNTER — Ambulatory Visit: Payer: Federal, State, Local not specified - PPO | Admitting: Family Medicine

## 2022-03-13 ENCOUNTER — Encounter: Payer: Self-pay | Admitting: Family Medicine

## 2022-03-13 DIAGNOSIS — G4733 Obstructive sleep apnea (adult) (pediatric): Secondary | ICD-10-CM | POA: Diagnosis not present

## 2022-03-27 DIAGNOSIS — E113511 Type 2 diabetes mellitus with proliferative diabetic retinopathy with macular edema, right eye: Secondary | ICD-10-CM | POA: Diagnosis not present

## 2022-04-03 ENCOUNTER — Telehealth: Payer: Self-pay | Admitting: Neurology

## 2022-04-03 ENCOUNTER — Telehealth: Payer: Self-pay | Admitting: *Deleted

## 2022-04-03 DIAGNOSIS — E1122 Type 2 diabetes mellitus with diabetic chronic kidney disease: Secondary | ICD-10-CM | POA: Diagnosis not present

## 2022-04-03 DIAGNOSIS — E1165 Type 2 diabetes mellitus with hyperglycemia: Secondary | ICD-10-CM | POA: Diagnosis not present

## 2022-04-03 DIAGNOSIS — E1169 Type 2 diabetes mellitus with other specified complication: Secondary | ICD-10-CM | POA: Diagnosis not present

## 2022-04-03 DIAGNOSIS — Z0289 Encounter for other administrative examinations: Secondary | ICD-10-CM

## 2022-04-03 NOTE — Telephone Encounter (Signed)
..   Pt understands that although there may be some limitations with this type of visit, we will take all precautions to reduce any security or privacy concerns.  Pt understands that this will be treated like an in office visit and we will file with pt's insurance, and there may be a patient responsible charge related to this service. ? ?

## 2022-04-03 NOTE — Progress Notes (Signed)
PATIENT: Tina [name] DOB: October 30, 1970  REASON FOR VISIT: follow up HISTORY FROM: patient  Virtual Visit via Telephone Note  I connected with Tina [name] on 04/07/22 at 10:30 AM EDT by telephone and verified that I am speaking with the correct person using two identifiers.   I discussed the limitations, risks, security and privacy concerns of performing an evaluation and management service by telephone and the availability of in person appointments. I also discussed with the patient that there may be a patient responsible charge related to this service. The patient expressed understanding and agreed to proceed.   History of Present Illness:  04/07/22 ALL: Tina [name] is a 52 y.o. female here today for follow up for OSA on CPAP.  She was last seen by Dr Brett Fairy 08/2021 and not using CPAP. She had misplaced her machine and needed new supplies. She was planning to restart therapy and was advised to follow up in 6 months. She has not resumed therapy. She reports having gastroparesis from Ozempic which caused significant sde effects. She recently tried to use her therapy but couldn't remember how her machine worked. She has not reached out to the DME. She does express desire to resume therapy.    HISTORY: (copied from Dr Dohmeier's previous note)  Tina [name] is a 52 y.o. year old 58 or [place] American female  patient seen here in a Rv  09/04/2021 . Here for yearly CPAP f/u. Pt reports misplacing her CPAP when relocating. Has not used it in a while. Has recently ordered a new mask last week and will restart as soon as it comes in.    She has not been using her CPAP after it was displaced in a move in February 2023, and she is restarting. Recent data not available.  She needs a new FFm, hose and water chamber.  She will restart and follow up with Np in 6 months time. Has lost  36 pounds.  She saw Dr Terri Skains, [facility] for CAD, and Dr Zadie Rhine for diabetic eye disease.   Non compliance has been a problem in her recent past    Observations/Objective:  Generalized: Well developed, in no acute distress  Mentation: Alert oriented to time, place, history taking. Follows all commands speech and language fluent   Assessment and Plan:  52 y.o. year old female  has a past medical history of Allergy, Anemia, Angioedema (08/13/2020), Anxiety, Back pain, chronic, Blood transfusion, Cerebral thrombosis with cerebral infarction ([place]) (06/2009), Constipation, Depression, Diabetes mellitus, Diabetic neuropathy ([place]), Edema, lower extremity, Endometriosis, Fatty liver, GERD (gastroesophageal reflux disease), H/O eye surgery, Headache(784.0), Hernia, incisional, RLQ, s/p lap repair Sep 2013 (09/02/2011), History of vertigo (03/22/2018), migraines (10/19/2011), Hyperlipidemia, Hypertension, Leg pain, right, Multiple food allergies, Panic disorder without agoraphobia, Rash, Restless leg syndrome, Right rotator cuff tear, SBO (small bowel obstruction) ([place]) (06/03/2012), Shortness of breath, Spastic hemiplegia affecting dominant side ([place]), Stomach problems, Stroke ([place]), Ventral hernia, and Weakness of right side of body. here with    ICD-10-CM   1. OSA on CPAP  G47.33 For home use only DME continuous positive airway pressure (CPAP)      Ms. Schaal has not resumed CPAP therapy since last visit with Dr Brett Fairy 08/2021. We have reviewed need for therapy and she was given educational materials regarding risks of untreated sleep apnea. I have encouraged her to contact [facility] for reeducation on use of Luna machine. DME orders placed as well. She was advised to call when she resumes  therapy and we will see her to review compliance in 3-4 months. She verbalizes understanding and agreement with this plan.   Orders Placed This Encounter  Procedures   For home use only DME continuous positive airway pressure (CPAP)    Reeducation on use of Luna machine.    Order Specific Question:    Length of Need    Answer:   Lifetime    Order Specific Question:   Patient has OSA or probable OSA    Answer:   Yes    Order Specific Question:   Is the patient currently using CPAP in the home    Answer:   Yes    Order Specific Question:   Settings    Answer:   Other see comments    Order Specific Question:   CPAP supplies needed    Answer:   Mask, headgear, cushions, filters, heated tubing and water chamber    No orders of the defined types were placed in this encounter.    Follow Up Instructions:  I discussed the assessment and treatment plan with the patient. The patient was provided an opportunity to ask questions and all were answered. The patient agreed with the plan and demonstrated an understanding of the instructions.   The patient was advised to call back or seek an in-person evaluation if the symptoms worsen or if the condition fails to improve as anticipated.  I provided 15 minutes of non-face-to-face time during this encounter. Patient located at their place of residence during [place] visit. Provider is in the office.    Debbora Presto, NP

## 2022-04-07 ENCOUNTER — Encounter: Payer: Self-pay | Admitting: Family Medicine

## 2022-04-07 ENCOUNTER — Telehealth (INDEPENDENT_AMBULATORY_CARE_PROVIDER_SITE_OTHER): Payer: Federal, State, Local not specified - PPO | Admitting: Family Medicine

## 2022-04-07 DIAGNOSIS — G4733 Obstructive sleep apnea (adult) (pediatric): Secondary | ICD-10-CM

## 2022-04-07 NOTE — Telephone Encounter (Signed)
Left message regarding video visit on 04/07/22. See my chart message in this encounter.

## 2022-04-07 NOTE — Patient Instructions (Signed)
Please continue using your CPAP regularly. While your insurance requires that you use CPAP at least 4 hours each night on 70% of the nights, I recommend, that you not skip any nights and use it throughout the night if you can. Getting used to CPAP and staying with the treatment long term does take time and patience and discipline. Untreated obstructive sleep apnea when it is moderate to severe can have an adverse impact on cardiovascular health and raise her risk for heart disease, arrhythmias, hypertension, congestive heart failure, stroke and diabetes. Untreated obstructive sleep apnea causes sleep disruption, nonrestorative sleep, and sleep deprivation. This can have an impact on your day to day functioning and cause daytime sleepiness and impairment of cognitive function, memory loss, mood disturbance, and problems focussing. Using CPAP regularly can improve these symptoms.  We will send orders to [facility] for reeducation on your CPAP machine. Please call [facility] to discuss concerns. Let me know once you resume therapy and we will schedule a visit in 3-4 months to review data.

## 2022-04-10 ENCOUNTER — Other Ambulatory Visit: Payer: Self-pay | Admitting: Cardiology

## 2022-04-10 ENCOUNTER — Other Ambulatory Visit: Payer: Self-pay | Admitting: Gastroenterology

## 2022-04-13 DIAGNOSIS — G4733 Obstructive sleep apnea (adult) (pediatric): Secondary | ICD-10-CM | POA: Diagnosis not present

## 2022-04-21 ENCOUNTER — Other Ambulatory Visit: Payer: Self-pay | Admitting: Gastroenterology

## 2022-04-21 DIAGNOSIS — I252 Old myocardial infarction: Secondary | ICD-10-CM | POA: Diagnosis not present

## 2022-04-21 DIAGNOSIS — E1122 Type 2 diabetes mellitus with diabetic chronic kidney disease: Secondary | ICD-10-CM | POA: Diagnosis not present

## 2022-04-21 DIAGNOSIS — I10 Essential (primary) hypertension: Secondary | ICD-10-CM | POA: Diagnosis not present

## 2022-04-21 DIAGNOSIS — E7800 Pure hypercholesterolemia, unspecified: Secondary | ICD-10-CM | POA: Diagnosis not present

## 2022-04-21 DIAGNOSIS — E1165 Type 2 diabetes mellitus with hyperglycemia: Secondary | ICD-10-CM | POA: Diagnosis not present

## 2022-04-30 DIAGNOSIS — E113511 Type 2 diabetes mellitus with proliferative diabetic retinopathy with macular edema, right eye: Secondary | ICD-10-CM | POA: Diagnosis not present

## 2022-05-04 DIAGNOSIS — E1165 Type 2 diabetes mellitus with hyperglycemia: Secondary | ICD-10-CM | POA: Diagnosis not present

## 2022-05-04 DIAGNOSIS — E1122 Type 2 diabetes mellitus with diabetic chronic kidney disease: Secondary | ICD-10-CM | POA: Diagnosis not present

## 2022-05-04 DIAGNOSIS — E1169 Type 2 diabetes mellitus with other specified complication: Secondary | ICD-10-CM | POA: Diagnosis not present

## 2022-05-14 DIAGNOSIS — E113592 Type 2 diabetes mellitus with proliferative diabetic retinopathy without macular edema, left eye: Secondary | ICD-10-CM | POA: Diagnosis not present

## 2022-05-30 DIAGNOSIS — G4733 Obstructive sleep apnea (adult) (pediatric): Secondary | ICD-10-CM | POA: Diagnosis not present

## 2022-06-02 DIAGNOSIS — E113511 Type 2 diabetes mellitus with proliferative diabetic retinopathy with macular edema, right eye: Secondary | ICD-10-CM | POA: Diagnosis not present

## 2022-06-03 DIAGNOSIS — E1165 Type 2 diabetes mellitus with hyperglycemia: Secondary | ICD-10-CM | POA: Diagnosis not present

## 2022-06-03 DIAGNOSIS — E1169 Type 2 diabetes mellitus with other specified complication: Secondary | ICD-10-CM | POA: Diagnosis not present

## 2022-06-03 DIAGNOSIS — E1122 Type 2 diabetes mellitus with diabetic chronic kidney disease: Secondary | ICD-10-CM | POA: Diagnosis not present

## 2022-06-12 DIAGNOSIS — E113592 Type 2 diabetes mellitus with proliferative diabetic retinopathy without macular edema, left eye: Secondary | ICD-10-CM | POA: Diagnosis not present

## 2022-06-24 DIAGNOSIS — E118 Type 2 diabetes mellitus with unspecified complications: Secondary | ICD-10-CM | POA: Diagnosis not present

## 2022-06-24 DIAGNOSIS — E559 Vitamin D deficiency, unspecified: Secondary | ICD-10-CM | POA: Diagnosis not present

## 2022-06-27 DIAGNOSIS — I10 Essential (primary) hypertension: Secondary | ICD-10-CM | POA: Diagnosis not present

## 2022-06-27 DIAGNOSIS — E11319 Type 2 diabetes mellitus with unspecified diabetic retinopathy without macular edema: Secondary | ICD-10-CM | POA: Diagnosis not present

## 2022-06-27 DIAGNOSIS — E1165 Type 2 diabetes mellitus with hyperglycemia: Secondary | ICD-10-CM | POA: Diagnosis not present

## 2022-06-27 DIAGNOSIS — N1832 Chronic kidney disease, stage 3b: Secondary | ICD-10-CM | POA: Diagnosis not present

## 2022-06-27 DIAGNOSIS — I252 Old myocardial infarction: Secondary | ICD-10-CM | POA: Diagnosis not present

## 2022-06-27 DIAGNOSIS — E1122 Type 2 diabetes mellitus with diabetic chronic kidney disease: Secondary | ICD-10-CM | POA: Diagnosis not present

## 2022-06-30 DIAGNOSIS — E113511 Type 2 diabetes mellitus with proliferative diabetic retinopathy with macular edema, right eye: Secondary | ICD-10-CM | POA: Diagnosis not present

## 2022-06-30 DIAGNOSIS — H26491 Other secondary cataract, right eye: Secondary | ICD-10-CM | POA: Diagnosis not present

## 2022-06-30 DIAGNOSIS — Z961 Presence of intraocular lens: Secondary | ICD-10-CM | POA: Diagnosis not present

## 2022-06-30 DIAGNOSIS — H3582 Retinal ischemia: Secondary | ICD-10-CM | POA: Diagnosis not present

## 2022-06-30 DIAGNOSIS — H31093 Other chorioretinal scars, bilateral: Secondary | ICD-10-CM | POA: Diagnosis not present

## 2022-06-30 DIAGNOSIS — E113592 Type 2 diabetes mellitus with proliferative diabetic retinopathy without macular edema, left eye: Secondary | ICD-10-CM | POA: Diagnosis not present

## 2022-07-04 DIAGNOSIS — E1165 Type 2 diabetes mellitus with hyperglycemia: Secondary | ICD-10-CM | POA: Diagnosis not present

## 2022-07-04 DIAGNOSIS — E1169 Type 2 diabetes mellitus with other specified complication: Secondary | ICD-10-CM | POA: Diagnosis not present

## 2022-07-04 DIAGNOSIS — E1122 Type 2 diabetes mellitus with diabetic chronic kidney disease: Secondary | ICD-10-CM | POA: Diagnosis not present

## 2022-07-07 ENCOUNTER — Other Ambulatory Visit: Payer: Self-pay | Admitting: Physical Medicine & Rehabilitation

## 2022-07-07 DIAGNOSIS — E113511 Type 2 diabetes mellitus with proliferative diabetic retinopathy with macular edema, right eye: Secondary | ICD-10-CM | POA: Diagnosis not present

## 2022-07-10 ENCOUNTER — Other Ambulatory Visit: Payer: Self-pay | Admitting: Gastroenterology

## 2022-07-14 DIAGNOSIS — E7800 Pure hypercholesterolemia, unspecified: Secondary | ICD-10-CM | POA: Diagnosis not present

## 2022-07-14 DIAGNOSIS — E1122 Type 2 diabetes mellitus with diabetic chronic kidney disease: Secondary | ICD-10-CM | POA: Diagnosis not present

## 2022-07-14 DIAGNOSIS — Z794 Long term (current) use of insulin: Secondary | ICD-10-CM | POA: Diagnosis not present

## 2022-07-14 DIAGNOSIS — E1165 Type 2 diabetes mellitus with hyperglycemia: Secondary | ICD-10-CM | POA: Diagnosis not present

## 2022-07-14 DIAGNOSIS — N1832 Chronic kidney disease, stage 3b: Secondary | ICD-10-CM | POA: Diagnosis not present

## 2022-07-14 DIAGNOSIS — I10 Essential (primary) hypertension: Secondary | ICD-10-CM | POA: Diagnosis not present

## 2022-07-15 DIAGNOSIS — E113592 Type 2 diabetes mellitus with proliferative diabetic retinopathy without macular edema, left eye: Secondary | ICD-10-CM | POA: Diagnosis not present

## 2022-07-21 DIAGNOSIS — M25562 Pain in left knee: Secondary | ICD-10-CM | POA: Diagnosis not present

## 2022-07-21 DIAGNOSIS — S6991XA Unspecified injury of right wrist, hand and finger(s), initial encounter: Secondary | ICD-10-CM | POA: Diagnosis not present

## 2022-07-21 DIAGNOSIS — M25561 Pain in right knee: Secondary | ICD-10-CM | POA: Diagnosis not present

## 2022-07-21 DIAGNOSIS — M79644 Pain in right finger(s): Secondary | ICD-10-CM | POA: Diagnosis not present

## 2022-07-22 DIAGNOSIS — Z9181 History of falling: Secondary | ICD-10-CM | POA: Diagnosis not present

## 2022-07-22 DIAGNOSIS — Z0289 Encounter for other administrative examinations: Secondary | ICD-10-CM | POA: Diagnosis not present

## 2022-07-28 DIAGNOSIS — M79646 Pain in unspecified finger(s): Secondary | ICD-10-CM | POA: Diagnosis not present

## 2022-07-28 DIAGNOSIS — R112 Nausea with vomiting, unspecified: Secondary | ICD-10-CM | POA: Diagnosis not present

## 2022-07-28 DIAGNOSIS — R197 Diarrhea, unspecified: Secondary | ICD-10-CM | POA: Diagnosis not present

## 2022-07-28 DIAGNOSIS — W19XXXA Unspecified fall, initial encounter: Secondary | ICD-10-CM | POA: Diagnosis not present

## 2022-07-30 DIAGNOSIS — Z961 Presence of intraocular lens: Secondary | ICD-10-CM | POA: Diagnosis not present

## 2022-07-30 DIAGNOSIS — E11319 Type 2 diabetes mellitus with unspecified diabetic retinopathy without macular edema: Secondary | ICD-10-CM | POA: Diagnosis not present

## 2022-07-30 DIAGNOSIS — H26491 Other secondary cataract, right eye: Secondary | ICD-10-CM | POA: Diagnosis not present

## 2022-07-31 ENCOUNTER — Encounter
Payer: Federal, State, Local not specified - PPO | Attending: Physical Medicine & Rehabilitation | Admitting: Physical Medicine & Rehabilitation

## 2022-07-31 ENCOUNTER — Encounter: Payer: Self-pay | Admitting: Physical Medicine & Rehabilitation

## 2022-07-31 VITALS — BP 119/72 | HR 75 | Ht 66.0 in | Wt 252.0 lb

## 2022-07-31 DIAGNOSIS — G894 Chronic pain syndrome: Secondary | ICD-10-CM

## 2022-07-31 DIAGNOSIS — S76112A Strain of left quadriceps muscle, fascia and tendon, initial encounter: Secondary | ICD-10-CM

## 2022-07-31 DIAGNOSIS — M25562 Pain in left knee: Secondary | ICD-10-CM | POA: Diagnosis not present

## 2022-07-31 DIAGNOSIS — I69351 Hemiplegia and hemiparesis following cerebral infarction affecting right dominant side: Secondary | ICD-10-CM | POA: Diagnosis not present

## 2022-07-31 DIAGNOSIS — M533 Sacrococcygeal disorders, not elsewhere classified: Secondary | ICD-10-CM

## 2022-07-31 NOTE — Progress Notes (Signed)
Subjective:    Patient ID: Tina [name], female    DOB: 10-20-1970, 52 y.o.   MRN: [id number]  HPI 52 year old female with history of bilateral pontine infarcts she has residual mild right hemiparesis as well as chronic balance issues.  She has a history of right greater than left sacroiliac dysfunction.  She has been doing relatively well except for some chronic pain requiring low-dose tramadol until she fell last week. Fall while on vacation in [place], she did not appreciate a 1 inch gap between a garage in the driveway.  She fell onto concrete. Went to local ED xrays of right thumb and l bilateral knees negative Some worsening of her chronic low back pain as well as persistent left knee pain. She has a follow-up appointment with hand surgery.  She had a carpal tunnel release last year and she has some pain in her left wrist as well. Pain Inventory Average Pain 8 Pain Right Now 8 My pain is sharp, stabbing, and aching  In the last 24 hours, has pain interfered with the following? General activity 9 Relation with others 9 Enjoyment of life 9 What TIME of day is your pain at its worst? morning , daytime, evening, and night Sleep (in general) Poor  Pain is worse with: walking, standing, and some activites Pain improves with:  . Relief from Meds:  .  Family History  Problem Relation Age of Onset   Diabetes Father    Kidney disease Father    Depression Father    Drug abuse Father    Allergic rhinitis Mother    Eczema Mother    Urticaria Mother    Depression Mother    Anxiety disorder Mother    Bipolar disorder Mother    Alcoholism Mother    Drug abuse Mother    Eating disorder Mother    Diabetes Maternal Grandmother    Hyperlipidemia Paternal Grandmother    Stroke Paternal Grandmother    Eczema Sister    Urticaria Sister    Colon cancer Paternal Uncle    Other Neg Hx    Angioedema Neg Hx    Asthma Neg Hx    Colon polyps Neg Hx    Esophageal cancer Neg Hx     Rectal cancer Neg Hx    Stomach cancer Neg Hx    Social History   Socioeconomic History   Marital status: Married    Spouse name: Jeannett Senior   Number of children: 3   Years of education: BA degree   Highest education level: Not on file  Occupational History   Occupation: stay at home mom    Employer: UNEMPLOYED  Tobacco Use   Smoking status: Never   Smokeless tobacco: Never  Vaping Use   Vaping status: Never Used  Substance and Sexual Activity   Alcohol use: No    Alcohol/week: 0.0 standard drinks of alcohol   Drug use: Not Currently   Sexual activity: Yes    Birth control/protection: None  Other Topics Concern   Not on file  Social History Narrative   Patient is married with 2 children.   Patient is right handed.   Patient has her  BA degree.   Patient drinks 1 cup daily.   Social Determinants of Health   Financial Resource Strain: Not on file  Food Insecurity: No Food Insecurity (07/27/2019)   Hunger Vital Sign    Worried About Running Out of Food in the Last Year: Never true    Ran Out  of Food in the Last Year: Never true  Transportation Needs: No Transportation Needs (07/27/2019)   PRAPARE - Administrator, Civil Service (Medical): No    Lack of Transportation (Non-Medical): No  Physical Activity: Not on file  Stress: Not on file  Social Connections: Not on file   Past Surgical History:  Procedure Laterality Date   APPLICATION OF WOUND VAC N/A 05/25/2015   Procedure: APPLICATION OF WOUND VAC;  Surgeon: Karie Soda, MD;  Location: [facility];  Service: General;  Laterality: N/A;   CARDIAC CATHETERIZATION     CESAREAN SECTION  2012   COLONOSCOPY     CORONARY STENT INTERVENTION N/A 09/05/2020   Procedure: CORONARY STENT INTERVENTION;  Surgeon: Elder Negus, MD;  Location: [facility];  Service: Cardiovascular;  Laterality: N/A;   CORONARY ULTRASOUND/IVUS N/A 09/05/2020   Procedure: Intravascular Ultrasound/IVUS;  Surgeon: Elder Negus, MD;  Location: [facility];  Service: Cardiovascular;  Laterality: N/A;   DIAGNOSTIC LAPAROSCOPY     ESOPHAGOGASTRODUODENOSCOPY N/A 06/03/2012   Procedure: ESOPHAGOGASTRODUODENOSCOPY (EGD);  Surgeon: Charna Elizabeth, MD;  Location: [facility];  Service: Endoscopy;  Laterality: N/A;   EXCISION MASS ABDOMINAL N/A 05/25/2015   Procedure: ABDOMINAL WALL EXPLORATION EXCISION OF SEROMA REMOVAL OF REDUNDANT SKIN ;  Surgeon: Karie Soda, MD;  Location: [facility];  Service: General;  Laterality: N/A;   EYE SURGERY     Eye laser for vessel hemorrhaging   fybroid removal     HERNIA REPAIR  10/03/2011   ventral hernia repair   INSERTION OF MESH N/A 02/04/2013   Procedure: INSERTION OF MESH;  Surgeon: Ardeth Sportsman, MD;  Location: [facility];  Service: General;  Laterality: N/A;   LEFT HEART CATH AND CORONARY ANGIOGRAPHY N/A 09/05/2020   Procedure: LEFT HEART CATH AND CORONARY ANGIOGRAPHY;  Surgeon: Elder Negus, MD;  [facility]: [facility];  Service: Cardiovascular;  Laterality: N/A;   UMBILICAL HERNIA REPAIR N/A 02/04/2013   Procedure: LAPAROSCOPIC ventral wall hernia repair LAPAROSCOPIC LYSIS OF ADHESIONS laparoscopic exploration of abdomen ;  Surgeon: Ardeth Sportsman, MD;  Location: [facility];  Service: General;  Laterality: N/A;   UPPER GASTROINTESTINAL ENDOSCOPY     URETER REVISION     Bilateral "twisted"   UTERINE FIBROID SURGERY     2 SURGERIES FOR FIBROIDS   VENTRAL HERNIA REPAIR  10/03/2011   Procedure: LAPAROSCOPIC VENTRAL HERNIA;  Surgeon: Ardeth Sportsman, MD;  Location: [facility];  Service: General;  Laterality: N/A;   Past Surgical History:  Procedure Laterality Date   APPLICATION OF WOUND VAC N/A 05/25/2015   Procedure: APPLICATION OF WOUND VAC;  Surgeon: Karie Soda, MD;  Location: [facility];  Service: General;  Laterality: N/A;   CARDIAC CATHETERIZATION     CESAREAN SECTION  2012   COLONOSCOPY     CORONARY STENT INTERVENTION N/A 09/05/2020   Procedure: CORONARY STENT  INTERVENTION;  Surgeon: Elder Negus, MD;  [facility];  Service: Cardiovascular;  Laterality: N/A;   CORONARY ULTRASOUND/IVUS N/A 09/05/2020   Procedure: Intravascular Ultrasound/IVUS;  Surgeon: Elder Negus, MD;  [facility];  Service: Cardiovascular;  Laterality: N/A;   DIAGNOSTIC LAPAROSCOPY     ESOPHAGOGASTRODUODENOSCOPY N/A 06/03/2012   Procedure: ESOPHAGOGASTRODUODENOSCOPY (EGD);  Surgeon: Charna Elizabeth, MD;  Location: [facility];  Service: Endoscopy;  Laterality: N/A;   EXCISION MASS ABDOMINAL N/A 05/25/2015   Procedure: ABDOMINAL WALL EXPLORATION EXCISION OF SEROMA REMOVAL OF REDUNDANT SKIN ;  Surgeon: Karie Soda, MD;  Location: [facility];  Service: General;  Laterality: N/A;   EYE SURGERY     Eye laser for vessel hemorrhaging   fybroid removal     HERNIA REPAIR  10/03/2011   ventral hernia repair   INSERTION OF MESH N/A 02/04/2013   Procedure: INSERTION OF MESH;  Surgeon: Ardeth Sportsman, MD;  Location: [facility];  Service: General;  Laterality: N/A;   LEFT HEART CATH AND CORONARY ANGIOGRAPHY N/A 09/05/2020   Procedure: LEFT HEART CATH AND CORONARY ANGIOGRAPHY;  Surgeon: Elder Negus, MD;  [facility];  Service: Cardiovascular;  Laterality: N/A;   UMBILICAL HERNIA REPAIR N/A 02/04/2013   Procedure: LAPAROSCOPIC ventral wall hernia repair LAPAROSCOPIC LYSIS OF ADHESIONS laparoscopic exploration of abdomen ;  Surgeon: Ardeth Sportsman, MD;  Location: [facility];  Service: General;  Laterality: N/A;   UPPER GASTROINTESTINAL ENDOSCOPY     URETER REVISION     Bilateral "twisted"   UTERINE FIBROID SURGERY     2 SURGERIES FOR FIBROIDS   VENTRAL HERNIA REPAIR  10/03/2011   Procedure: LAPAROSCOPIC VENTRAL HERNIA;  Surgeon: Ardeth Sportsman, MD;  Location: [facility];  Service: General;  Laterality: N/A;   Past Medical History:  Diagnosis Date   Allergy    Anemia    DURING MENSES--HAS HEAVY BLEEDING WITH PERODS   Angioedema  08/13/2020   Anxiety    Back pain, chronic    "ongoing"   Blood transfusion    IN 2012  AFTER C -SECTION   Cerebral thrombosis with cerebral infarction (HCC) 06/2009   RIGHT SIDED WEAKNESS ( ARM AND LEG ) AND SPASMS-remains with slight weakness and vertigo.   Constipation    Depression    Diabetes mellitus    Diabetic neuropathy (HCC)    BOTH FEET --COMES AND GOES   Edema, lower extremity    Endometriosis    Fatty liver    GERD (gastroesophageal reflux disease)    with pregnancy   H/O eye surgery    Headache(784.0)    MIGRAINES--NOT REALLY HEADACHE-MORE LIKE PRESSURE SENSATION IN HEAD-FEELS DIZZIY AND  FAINT AS THE PRESSURE RESOLVES   Hernia, incisional, RLQ, s/p lap repair Sep 2013 09/02/2011   History of vertigo 03/22/2018   Hx of migraines 10/19/2011   Hyperlipidemia    Hypertension    Leg pain, right    "like bad Charley horse"   Multiple food allergies    Panic disorder without agoraphobia    Rash    HANDS, ARMS --STATES HX OF RASH EVER SINCE CHILDBIRTH/PREGNANCY.  STATES THE RASH OFTEN OCCURS WHEN SHE IS REALLY STRESSED."goes and comes-presently left ring finger"   Restless leg syndrome    DIAGNOSED BY SLEEP STUDY - PT TOLD SHE DID NOT HAVE SLEEP APNEA   Right rotator cuff tear    PAIN IN RIGHT SHOULDER   SBO (small bowel obstruction) (HCC) 06/03/2012   Shortness of breath    Spastic hemiplegia affecting dominant side (HCC)    Stomach problems    Stroke (HCC)    Ventral hernia    RIGHT LOWER QUADRANT-CAUSING SOME PAIN   Weakness of right side of body    BP 119/72   Pulse 75   Ht 5\' 6"  (1.676 m)   Wt 252 lb (114.3 kg)   SpO2 97%   BMI 40.67 kg/m   Opioid Risk Score:   Fall Risk Score:  `1  Depression screen [facility] 2/9     09/03/2021  1:23 PM 07/11/2021   11:20 AM 05/30/2021   10:56 AM 03/26/2021   10:21 AM 01/02/2021    4:30 PM 12/25/2020    2:14 PM 11/09/2020    4:15 PM  Depression screen PHQ 2/9  Decreased Interest 1 0 1 1 0 1 3  Down, Depressed,  Hopeless 1 0 1 1 0 1 3  PHQ - 2 Score 2 0 2 2 0 2 6  Altered sleeping       3  Tired, decreased energy       3  Change in appetite       2  Feeling bad or failure about yourself        0  Trouble concentrating       2  Moving slowly or fidgety/restless       1  Suicidal thoughts       0  PHQ-9 Score       17  Difficult doing work/chores       Somewhat difficult     Review of Systems  Musculoskeletal:  Positive for back pain.       Right shoulder pain Right hand pain Right knee pain Pain going down right leg  All other systems reviewed and are negative.     Objective:   Physical Exam  General no acute distress Mood and affect are appropriate Extremities without edema There is a small scab on the patella on the left side.  No evidence of joint effusion there is tenderness along the patella as well as the superior aspect at the quadricep tendon insertion.  No step-off. The patient has normal lower extremity strength in hip flexor on the left side knee extensor ankle dorsiflexor the right side is with chronic weakness i.e. 4/5 strength in hip flexor knee extensor ankle dorsiflexor no recent weakening.  Sacral thrust (prone) : Positive Lateral compression: Negative FABER's: Positive Distraction (supine): Negative Thigh thrust test: Negative      Assessment & Plan:   1.  History of bilateral pontine infarcts chronic right hemiparesis no evidence of new neurologic deficits.  2.  Sacroiliac dysfunction with acute exacerbation 3.  Left knee pain negative x-rays may also have quadriceps tendon strain.  Have given her some exercises for this.  We discussed PT but she would like to hold off on this for now. Continue low-dose tramadol Consider sacroiliac injections if no improvement during physical medicine rehab follow-up in 4 to 6 weeks.

## 2022-07-31 NOTE — Patient Instructions (Signed)
Diclofenac gel to left knee 4 times a day   Try "Gator "  exercise for shoulders    Quadriceps Strain Rehab  Ask your health care provider which exercises are safe for you. Do exercises exactly as told by your health care provider and adjust them as directed. It is normal to feel mild stretching, pulling, tightness, or discomfort as you do these exercises. Stop right away if you feel sudden pain or your pain gets worse. Do not begin these exercises until told by your health care provider. Stretching and range-of-motion exercises These exercises warm up your muscles and joints and improve the movement and flexibility of your thigh. These exercises can also help to relieve stiffness or swelling. Heel slides  Lie on your back with both legs straight. If this causes back discomfort, bend the knee of your healthy leg so your foot is flat on the floor. Slowly slide your left / right heel back toward your buttocks. Stop when you feel a gentle stretch in the front of your knee or thigh (quadriceps). Hold this position for __________ seconds. Slowly slide your left / right heel back to the starting position. Repeat __________ times. Complete this exercise __________ times a day. Quadriceps stretch, prone  Lie on your abdomen on a firm surface, such as a bed or padded floor (prone position). Bend your left / right knee and hold your ankle. If you cannot reach your ankle or pant leg, loop a belt around your foot and grab the belt instead. Gently pull your heel toward your buttocks. Your knee should not slide out to the side. You should feel a stretch in the front of your thigh and knee (quadriceps). Hold this position for __________ seconds. Repeat __________ times. Complete this exercise __________ times a day. Strengthening exercises These exercises build strength and endurance in your thigh. Endurance is the ability to use your muscles for a long time, even after your muscles get tired. Straight  leg raises, supine  This exercise stretches the muscles in front of your thigh (quadriceps) and the muscles that move your hips (hip flexors). Quality counts! Watch for signs that the quadriceps muscle is working to ensure that you are strengthening the correct muscles and not cheating by using healthier muscles. Lie on your back (supine position) with your left / right leg extended and your other knee bent. Tense the muscles in the front of your left / right thigh. You should see your kneecap slide up or see increased dimpling just above the knee. Tighten these muscles even more and raise your leg 4-6 inches (10-15 cm) off the floor. Hold this position for __________ seconds. Keep the thigh muscles tense as you lower your leg. Relax the muscles slowly and completely after each repetition. Repeat __________ times. Complete this exercise __________ times a day. Leg raises, prone This exercise strengthens the muscles that move the hips (hip extensors). Lie on your abdomen on a bed or a firm surface (prone position). Place a pillow under your hips. Bend your left / right knee so your foot is straight up in the air. Squeeze your buttocks muscles and lift your left / right thigh off the bed. Do not let your back arch. Hold this position for __________ seconds. Slowly return to the starting position. Let your muscles relax completely before doing another repetition. Repeat __________ times. Complete this exercise __________ times a day. Wall sits Follow the directions for form closely. Knee pain can occur if your feet or knees  are not placed properly. Lean your back against a smooth wall or door, and walk your feet out 18-24 inches (46-61 cm) from it. Place your feet hip-width apart. Slowly slide down the wall or door until your knees bend __________ degrees. Keep your weight back and over your heels, not over your toes. Keep your thighs straight or pointing slightly outward. Hold this position for  __________ seconds. Use your thigh and buttocks muscles to push yourself back up to a standing position. Keep your weight through your heels while you do this. Rest for __________ seconds after each repetition. Repeat __________ times. Complete this exercise __________ times a day. This information is not intended to replace advice given to you by your health care provider. Make sure you discuss any questions you have with your health care provider. Document Revised: 06/18/2020 Document Reviewed: 06/18/2020 Elsevier Patient Education  2024 [employer].

## 2022-08-03 DIAGNOSIS — E1165 Type 2 diabetes mellitus with hyperglycemia: Secondary | ICD-10-CM | POA: Diagnosis not present

## 2022-08-03 DIAGNOSIS — E1122 Type 2 diabetes mellitus with diabetic chronic kidney disease: Secondary | ICD-10-CM | POA: Diagnosis not present

## 2022-08-03 DIAGNOSIS — E1169 Type 2 diabetes mellitus with other specified complication: Secondary | ICD-10-CM | POA: Diagnosis not present

## 2022-08-04 DIAGNOSIS — Z9181 History of falling: Secondary | ICD-10-CM | POA: Diagnosis not present

## 2022-08-04 DIAGNOSIS — M7989 Other specified soft tissue disorders: Secondary | ICD-10-CM | POA: Diagnosis not present

## 2022-08-04 DIAGNOSIS — M79644 Pain in right finger(s): Secondary | ICD-10-CM | POA: Diagnosis not present

## 2022-08-07 DIAGNOSIS — M79642 Pain in left hand: Secondary | ICD-10-CM | POA: Diagnosis not present

## 2022-08-07 DIAGNOSIS — M79644 Pain in right finger(s): Secondary | ICD-10-CM | POA: Diagnosis not present

## 2022-08-07 DIAGNOSIS — M79641 Pain in right hand: Secondary | ICD-10-CM | POA: Diagnosis not present

## 2022-08-08 DIAGNOSIS — I10 Essential (primary) hypertension: Secondary | ICD-10-CM | POA: Diagnosis not present

## 2022-08-08 DIAGNOSIS — E1165 Type 2 diabetes mellitus with hyperglycemia: Secondary | ICD-10-CM | POA: Diagnosis not present

## 2022-08-08 DIAGNOSIS — E7800 Pure hypercholesterolemia, unspecified: Secondary | ICD-10-CM | POA: Diagnosis not present

## 2022-08-08 DIAGNOSIS — N1832 Chronic kidney disease, stage 3b: Secondary | ICD-10-CM | POA: Diagnosis not present

## 2022-08-08 DIAGNOSIS — E1122 Type 2 diabetes mellitus with diabetic chronic kidney disease: Secondary | ICD-10-CM | POA: Diagnosis not present

## 2022-08-11 ENCOUNTER — Ambulatory Visit (INDEPENDENT_AMBULATORY_CARE_PROVIDER_SITE_OTHER): Payer: PPO

## 2022-08-11 ENCOUNTER — Ambulatory Visit (INDEPENDENT_AMBULATORY_CARE_PROVIDER_SITE_OTHER): Payer: PPO | Admitting: Podiatry

## 2022-08-11 DIAGNOSIS — R600 Localized edema: Secondary | ICD-10-CM

## 2022-08-11 DIAGNOSIS — M7752 Other enthesopathy of left foot: Secondary | ICD-10-CM | POA: Diagnosis not present

## 2022-08-11 DIAGNOSIS — S90425A Blister (nonthermal), left lesser toe(s), initial encounter: Secondary | ICD-10-CM | POA: Diagnosis not present

## 2022-08-11 DIAGNOSIS — S93402A Sprain of unspecified ligament of left ankle, initial encounter: Secondary | ICD-10-CM

## 2022-08-11 DIAGNOSIS — R262 Difficulty in walking, not elsewhere classified: Secondary | ICD-10-CM | POA: Diagnosis not present

## 2022-08-11 DIAGNOSIS — E113511 Type 2 diabetes mellitus with proliferative diabetic retinopathy with macular edema, right eye: Secondary | ICD-10-CM | POA: Diagnosis not present

## 2022-08-11 MED ORDER — MELOXICAM 7.5 MG PO TABS: 7.5000 mg | ORAL_TABLET | Freq: Every day | ORAL | 0 refills | Status: DC

## 2022-08-11 NOTE — Progress Notes (Unsigned)
Chief Complaint  Patient presents with   Ankle Pain    LEFT ANKLE PAIN, INJURED 07/21/22, PAIN LEVEL - 6, HAS BEEN ICING IT, STRETCHES AND ELEVATING IT. HAS HAD SWELLING BLISTER CAME UP 07/26/22, IT HAS WENT DOWN IN SIZE, DIDN'T POP IT. DIABETIC    HPI: 52 y.o. female presents today after spraining her left ankle when stepping off a slight curb that was not marked well, she stated.  She states that she immediately rolled her left ankle outward.  She has had moderate pain and swelling to the area.  She also noted at the time of the injury she developed a blister underneath the left great toe.  She states that it has not drained and she did not pick at it.  Past Medical History:  Diagnosis Date   Allergy    Anemia    DURING MENSES--HAS HEAVY BLEEDING WITH PERODS   Angioedema 08/13/2020   Anxiety    Back pain, chronic    "ongoing"   Blood transfusion    IN 2012  AFTER C -SECTION   Cerebral thrombosis with cerebral infarction (HCC) 06/2009   RIGHT SIDED WEAKNESS ( ARM AND LEG ) AND SPASMS-remains with slight weakness and vertigo.   Constipation    Depression    Diabetes mellitus    Diabetic neuropathy (HCC)    BOTH FEET --COMES AND GOES   Edema, lower extremity    Endometriosis    Fatty liver    GERD (gastroesophageal reflux disease)    with pregnancy   H/O eye surgery    Headache(784.0)    MIGRAINES--NOT REALLY HEADACHE-MORE LIKE PRESSURE SENSATION IN HEAD-FEELS DIZZIY AND  FAINT AS THE PRESSURE RESOLVES   Hernia, incisional, RLQ, s/p lap repair Sep 2013 09/02/2011   History of vertigo 03/22/2018   Hx of migraines 10/19/2011   Hyperlipidemia    Hypertension    Leg pain, right    "like bad Charley horse"   Multiple food allergies    Panic disorder without agoraphobia    Rash    HANDS, ARMS --STATES HX OF RASH EVER SINCE CHILDBIRTH/PREGNANCY.  STATES THE RASH OFTEN OCCURS WHEN SHE IS REALLY STRESSED."goes and comes-presently left ring finger"   Restless leg syndrome     DIAGNOSED BY SLEEP STUDY - PT TOLD SHE DID NOT HAVE SLEEP APNEA   Right rotator cuff tear    PAIN IN RIGHT SHOULDER   SBO (small bowel obstruction) (HCC) 06/03/2012   Shortness of breath    Spastic hemiplegia affecting dominant side (HCC)    Stomach problems    Stroke (HCC)    Ventral hernia    RIGHT LOWER QUADRANT-CAUSING SOME PAIN   Weakness of right side of body     Past Surgical History:  Procedure Laterality Date   APPLICATION OF WOUND VAC N/A 05/25/2015   Procedure: APPLICATION OF WOUND VAC;  Surgeon: Karie Soda, MD;  Location: [facility];  Service: General;  Laterality: N/A;   CARDIAC CATHETERIZATION     CESAREAN SECTION  2012   COLONOSCOPY     CORONARY STENT INTERVENTION N/A 09/05/2020   Procedure: CORONARY STENT INTERVENTION;  Surgeon: Elder Negus, MD;  Location: [facility];  Service: Cardiovascular;  Laterality: N/A;   CORONARY ULTRASOUND/IVUS N/A 09/05/2020   Procedure: Intravascular Ultrasound/IVUS;  Surgeon: Elder Negus, MD;  Location: [facility];  Service: Cardiovascular;  Laterality: N/A;   DIAGNOSTIC LAPAROSCOPY     ESOPHAGOGASTRODUODENOSCOPY N/A 06/03/2012   Procedure:  ESOPHAGOGASTRODUODENOSCOPY (EGD);  Surgeon: Charna Elizabeth, MD;  Location: [facility];  Service: Endoscopy;  Laterality: N/A;   EXCISION MASS ABDOMINAL N/A 05/25/2015   Procedure: ABDOMINAL WALL EXPLORATION EXCISION OF SEROMA REMOVAL OF REDUNDANT SKIN ;  Surgeon: Karie Soda, MD;  Location: [facility];  Service: General;  Laterality: N/A;   EYE SURGERY     Eye laser for vessel hemorrhaging   fybroid removal     HERNIA REPAIR  10/03/2011   ventral hernia repair   INSERTION OF MESH N/A 02/04/2013   Procedure: INSERTION OF MESH;  Surgeon: Ardeth Sportsman, MD;  Location: [facility];  Service: General;  Laterality: N/A;   LEFT HEART CATH AND CORONARY ANGIOGRAPHY N/A 09/05/2020   Procedure: LEFT HEART CATH AND CORONARY ANGIOGRAPHY;  Surgeon: Elder Negus, MD;  Location: [facility];  Service: Cardiovascular;  Laterality: N/A;   UMBILICAL HERNIA REPAIR N/A 02/04/2013   Procedure: LAPAROSCOPIC ventral wall hernia repair LAPAROSCOPIC LYSIS OF ADHESIONS laparoscopic exploration of abdomen ;  Surgeon: Ardeth Sportsman, MD;  Location: [facility];  Service: General;  Laterality: N/A;   UPPER GASTROINTESTINAL ENDOSCOPY     URETER REVISION     Bilateral "twisted"   UTERINE FIBROID SURGERY     2 SURGERIES FOR FIBROIDS   VENTRAL HERNIA REPAIR  10/03/2011   Procedure: LAPAROSCOPIC VENTRAL HERNIA;  Surgeon: Ardeth Sportsman, MD;  Location: [facility];  Service: General;  Laterality: N/A;    Allergies  Allergen Reactions   Amlodipine Benzoate Swelling and Other (See Comments)    Leg swelling Other reaction(s): leg swelling Other reaction(s): ? leg swelling at 10 mg dose Other reaction(s): ? leg swelling at 10 mg dose Other reaction(s): ? leg swelling at 10 mg dose   Contrast Media [Iodinated Contrast Media] Shortness Of Breath and Other (See Comments)    Difficulty breathing   Iodine Anaphylaxis    Other reaction(s): Unknown Other reaction(s): Unknown Other reaction(s): Unknown Other reaction(s): Unknown   Iohexol Hives, Nausea And Vomiting, Swelling and Other (See Comments)     Desc: Magnevist-gadolinium-difficulty breathing, throat swelling    Midazolam Hcl Anaphylaxis    Difficulty breathing   Other Shortness Of Breath, Itching and Other (See Comments)    Patient is allergic to all nuts except peanuts, which are NOT "nuts"- they are legumes   Shellfish Allergy Anaphylaxis   Shellfish-Derived Products Anaphylaxis and Other (See Comments)    Other reaction(s): Unknown   Valsartan Swelling    Other reaction(s): Unknown Other reaction(s): Unknown Other reaction(s): angioedema Other reaction(s): angioedema   Metformin And Related Diarrhea, Nausea And Vomiting and Other (See Comments)    Dehydration, also   [place] [Dapagliflozin] Nausea Only and Other (See  Comments)    UTI, headaches and muscle aches, also   Hydralazine Other (See Comments)    Reaction not recalled Other reaction(s): Unknown Other reaction(s): Unknown Other reaction(s): Unknown Other reaction(s): Unknown   Saxagliptin Hives, Nausea And Vomiting and Other (See Comments)    ONGLYZA- dehydration, also Other reaction(s): Unknown Other reaction(s): Unknown Other reaction(s): Unknown Other reaction(s): Unknown   Spironolactone Other (See Comments)    Elevated potassium- Severe hyperkalemia Other reaction(s): severe hyperkalemia Other reaction(s): severe hyperkalemia Other reaction(s): severe hyperkalemia   Avandia [Rosiglitazone Maleate] Hives and Other (See Comments)   Geodon [Ziprasidone] Other (See Comments)    Reaction not recalled   Kiwi Extract Itching and Swelling   Latex Itching    Other reaction(s): Unknown Other reaction(s): Unknown  Physical Exam: There were no vitals filed for this visit.  General: The patient is alert and oriented x3 in no acute distress.  Dermatology: Skin is warm, dry and supple bilateral lower extremities. Interspaces are clear of maceration and debris.  There is evidence of a previous blister on the distal plantar aspect of the left hallux.  The roof is intact and is dry and stable.  No surrounding erythema is noted.  No edema is noted  Vascular: Palpable pedal pulses bilaterally. Capillary refill within normal limits.  There is localized edema near the lateral ankle joint on the left.  Neurological: Epicritic sensation grossly intact bilateral feet.   Musculoskeletal Exam: Pain on palpation anterolateral left ankle.  Pain with forced inversion of the ankle.  Mild pain on palpation of the calcaneofibular ligament and moderate pain on palpation of the anterior talofibular ligament.  Localized edema in the area.  No pain on palpation of the fibula.  No medial ankle joint pain.  No pain along the Achilles.    Radiographic Exam (left  ankle, 3 weightbearing views, 08/11/2022):  Normal osseous mineralization.  There is mild dorsal spurring at the navicular-medial cuneiform joint.  There are inferior and posterior calcaneal spurs noted.  No fracture is seen  Assessment/Plan of Care: 1. Mild ankle sprain, left, initial encounter   2. Capsulitis of ankle, left   3. Blister of toe of left foot, initial encounter      Meds ordered this encounter  Medications   meloxicam (MOBIC) 7.5 MG tablet    Sig: Take 1 tablet (7.5 mg total) by mouth daily.    Dispense:  30 tablet    Refill:  0   Discussed clinical findings with patient today.  Patient was fitted for a cam walker for the left foot and ankle.  She will be immobilized for the next 2 to 3 weeks while the ankle sprain heals.  This will allow her to be ambulatory but not full on the strained ligaments in the area.  She was instructed to minimize her weightbearing activities for now, elevate the foot at home, ice the lateral ankle 15 to 20 minutes several times a day as needed for pain.  She was given a prescription for meloxicam as well.  The area that had blistered on the distal hallux is dry and stable.  No care is needed for this today.  Informed patient it may start peeling over the next few days as the dry skin starts to slough away.  We can clean this up if needed at her next appointment.   Clerance Lav, DPM, FACFAS [facility]     [address] [place], [place] [address]                Office [phone]  Fax [phone]

## 2022-08-12 ENCOUNTER — Ambulatory Visit: Payer: Federal, State, Local not specified - PPO | Admitting: Cardiology

## 2022-08-12 ENCOUNTER — Encounter: Payer: Self-pay | Admitting: Physical Medicine & Rehabilitation

## 2022-08-12 ENCOUNTER — Encounter: Payer: Self-pay | Admitting: Cardiology

## 2022-08-12 ENCOUNTER — Encounter (HOSPITAL_BASED_OUTPATIENT_CLINIC_OR_DEPARTMENT_OTHER): Payer: Federal, State, Local not specified - PPO | Admitting: Physical Medicine & Rehabilitation

## 2022-08-12 VITALS — BP 149/81 | HR 79 | Resp 16 | Ht 66.0 in | Wt 250.0 lb

## 2022-08-12 VITALS — BP 141/82 | HR 76 | Ht 66.0 in | Wt 250.0 lb

## 2022-08-12 DIAGNOSIS — M533 Sacrococcygeal disorders, not elsewhere classified: Secondary | ICD-10-CM | POA: Diagnosis not present

## 2022-08-12 DIAGNOSIS — Z955 Presence of coronary angioplasty implant and graft: Secondary | ICD-10-CM | POA: Diagnosis not present

## 2022-08-12 DIAGNOSIS — E1140 Type 2 diabetes mellitus with diabetic neuropathy, unspecified: Secondary | ICD-10-CM | POA: Diagnosis not present

## 2022-08-12 DIAGNOSIS — I252 Old myocardial infarction: Secondary | ICD-10-CM | POA: Diagnosis not present

## 2022-08-12 DIAGNOSIS — Z794 Long term (current) use of insulin: Secondary | ICD-10-CM

## 2022-08-12 DIAGNOSIS — I10 Essential (primary) hypertension: Secondary | ICD-10-CM

## 2022-08-12 DIAGNOSIS — I2510 Atherosclerotic heart disease of native coronary artery without angina pectoris: Secondary | ICD-10-CM | POA: Diagnosis not present

## 2022-08-12 DIAGNOSIS — G894 Chronic pain syndrome: Secondary | ICD-10-CM | POA: Diagnosis not present

## 2022-08-12 DIAGNOSIS — Z8673 Personal history of transient ischemic attack (TIA), and cerebral infarction without residual deficits: Secondary | ICD-10-CM

## 2022-08-12 DIAGNOSIS — I69351 Hemiplegia and hemiparesis following cerebral infarction affecting right dominant side: Secondary | ICD-10-CM | POA: Diagnosis not present

## 2022-08-12 DIAGNOSIS — S76112A Strain of left quadriceps muscle, fascia and tendon, initial encounter: Secondary | ICD-10-CM | POA: Diagnosis not present

## 2022-08-12 NOTE — Progress Notes (Signed)
Date:  08/12/2022   ID:  Tina [name], DOB 23-Jan-1970, MRN [id number]  PCP:  Irena Reichmann, DO  Cardiologist:  Tessa Lerner, DO, [facility] (established care 04/07/2019) Former cardiology providers: [place], APRN, FNP-C  Date: 08/12/22 Last Office Visit: 08/22/2021  Chief Complaint  Patient presents with   Coronary Artery Disease   Follow-up    HPI  Tina [name] is a 52 y.o. African-American female whose past medical history and cardiovascular risk factors include: OSA on CPAP, hypertension, insulin-dependent diabetes mellitus, h/o stroke 2011, obesity, NSTEMI treated with OM2 PCI 09/05/2020, hyperlipidemia, obesity due to excess calories.  She has known history of NSTEMI in April 2022 she underwent angiography and was noted to have obstructive disease.  She underwent intervention to the second OM branch and has been on dual antiplatelet therapy.  She presents today for 1 year follow-up visit.  She denies anginal chest pain or heart failure symptoms.  Unfortunately, she had a episode of gastritis and has discontinued Ozempic.  At the time of Ozempic discontinuation she was on the highest dose.  Since then she has regained all of the weight back and more.  She still motivated with regards to losing weight with increasing walking as tolerated, she gets precooked meals.  She is currently recovering from a fall which has led to a left leg injury.  Her home blood pressures are well-controlled before the fall/injury SBP would be around 120-130 mmHg.  ALLERGIES: Allergies  Allergen Reactions   Amlodipine Benzoate Swelling and Other (See Comments)    Leg swelling Other reaction(s): leg swelling Other reaction(s): ? leg swelling at 10 mg dose Other reaction(s): ? leg swelling at 10 mg dose Other reaction(s): ? leg swelling at 10 mg dose   Contrast Media [Iodinated Contrast Media] Shortness Of Breath and Other (See Comments)    Difficulty breathing   Iodine Anaphylaxis    Other  reaction(s): Unknown Other reaction(s): Unknown Other reaction(s): Unknown Other reaction(s): Unknown   Iohexol Hives, Nausea And Vomiting, Swelling and Other (See Comments)     Desc: Magnevist-gadolinium-difficulty breathing, throat swelling    Midazolam Hcl Anaphylaxis    Difficulty breathing   Other Shortness Of Breath, Itching and Other (See Comments)    Patient is allergic to all nuts except peanuts, which are NOT "nuts"- they are legumes   Shellfish Allergy Anaphylaxis   Shellfish-Derived Products Anaphylaxis and Other (See Comments)    Other reaction(s): Unknown   Valsartan Swelling    Other reaction(s): Unknown Other reaction(s): Unknown Other reaction(s): angioedema Other reaction(s): angioedema   Metformin And Related Diarrhea, Nausea And Vomiting and Other (See Comments)    Dehydration, also   [place] [Dapagliflozin] Nausea Only and Other (See Comments)    UTI, headaches and muscle aches, also   Hydralazine Other (See Comments)    Reaction not recalled Other reaction(s): Unknown Other reaction(s): Unknown Other reaction(s): Unknown Other reaction(s): Unknown   Saxagliptin Hives, Nausea And Vomiting and Other (See Comments)    ONGLYZA- dehydration, also Other reaction(s): Unknown Other reaction(s): Unknown Other reaction(s): Unknown Other reaction(s): Unknown   Spironolactone Other (See Comments)    Elevated potassium- Severe hyperkalemia Other reaction(s): severe hyperkalemia Other reaction(s): severe hyperkalemia Other reaction(s): severe hyperkalemia   Avandia [Rosiglitazone Maleate] Hives and Other (See Comments)   Geodon [Ziprasidone] Other (See Comments)    Reaction not recalled   Kiwi Extract Itching and Swelling   Latex Itching    Other reaction(s): Unknown Other reaction(s): Unknown  MEDICATION LIST PRIOR TO VISIT: Current Meds  Medication Sig   albuterol (VENTOLIN HFA) 108 (90 Base) MCG/ACT inhaler Inhale 2 puffs into the lungs every 4 (four)  hours as needed for wheezing or shortness of breath.   allopurinol (ZYLOPRIM) 100 MG tablet Take 1 tablet by mouth daily.   amLODipine (NORVASC) 5 MG tablet Take 1 tablet (5 mg total) by mouth daily.   aspirin EC 81 MG tablet Take 81 mg by mouth at bedtime. Swallow whole.   atorvastatin (LIPITOR) 40 MG tablet Take 1 tablet (40 mg total) by mouth at bedtime.   carvedilol (COREG) 25 MG tablet Take 1 tablet by mouth 2 (two) times daily with a meal.   cetirizine (ZYRTEC) 10 MG tablet Take 10 mg by mouth daily as needed for allergies (itching).   clopidogrel (PLAVIX) 75 MG tablet TAKE ONE TABLET BY MOUTH ONCE DAILY   colchicine 0.6 MG tablet Take 0.6 mg by mouth daily.   Continuous Blood Gluc Sensor (FREESTYLE LIBRE 2 SENSOR) MISC Inject 1 Device into the skin every 14 (fourteen) days.   DULoxetine (CYMBALTA) 20 MG capsule Take 20-40 mg by mouth See admin instructions. Take 20 mg by mouth in the morning and 40 mg at bedtime   empagliflozin (JARDIANCE) 25 MG TABS tablet 1 tablet   EPINEPHrine (AUVI-Q) 0.3 mg/0.3 mL IJ SOAJ injection Inject 0.3 mg into the muscle as needed for anaphylaxis.   famotidine (PEPCID) 20 MG tablet Take 1 tablet by mouth 2 (two) times daily.   furosemide (LASIX) 40 MG tablet Take 40 mg by mouth in the morning.   gabapentin (NEURONTIN) 300 MG capsule Take 300 mg by mouth at bedtime.   hydrALAZINE (APRESOLINE) 10 MG tablet TAKE ONE TABLET BY MOUTH THREE TIMES DAILY   Insulin Aspart FlexPen (NOVOLOG) 100 UNIT/ML Inject 0-25 Units into the skin 3 (three) times daily before meals. Dose per sliding scale.   Insulin Glargine (BASAGLAR KWIKPEN) 100 UNIT/ML Inject 22 Units into the skin at bedtime.   isosorbide mononitrate (IMDUR) 60 MG 24 hr tablet TAKE ONE TABLET BY MOUTH ONCE DAILY   meloxicam (MOBIC) 7.5 MG tablet Take 1 tablet (7.5 mg total) by mouth daily.   ondansetron (ZOFRAN-ODT) 4 MG disintegrating tablet Take 1 tablet (4 mg total) by mouth every 8 (eight) hours as needed for  nausea or vomiting.   pantoprazole (PROTONIX) 40 MG tablet Take 1 tablet (40 mg total) by mouth daily. Please schedule an office visit for further refills   Prucalopride Succinate (MOTEGRITY) 2 MG TABS Take 1 tablet (2 mg total) by mouth daily. Lot: [id number], ExP: 10-2023   spironolactone (ALDACTONE) 25 MG tablet Take 25 mg by mouth daily.   tiZANidine (ZANAFLEX) 2 MG tablet TAKE ONE TABLET BY MOUTH EVERYDAY AT BEDTIME   traMADol (ULTRAM) 50 MG tablet Take 1 tablet by mouth as needed.   Vitamin D, Cholecalciferol, 25 MCG (1000 UT) CAPS 1 capsule   [DISCONTINUED] diphenhydrAMINE (BENADRYL) 25 MG tablet Take 1 tablet (25 mg total) by mouth every 8 (eight) hours as needed for up to 2 days for itching.     PAST MEDICAL HISTORY: Past Medical History:  Diagnosis Date   Allergy    Anemia    DURING MENSES--HAS HEAVY BLEEDING WITH PERODS   Angioedema 08/13/2020   Anxiety    Back pain, chronic    "ongoing"   Blood transfusion    IN 2012  AFTER C -SECTION   Cerebral thrombosis with cerebral infarction (HCC) 06/2009  RIGHT SIDED WEAKNESS ( ARM AND LEG ) AND SPASMS-remains with slight weakness and vertigo.   Constipation    Depression    Diabetes mellitus    Diabetic neuropathy (HCC)    BOTH FEET --COMES AND GOES   Edema, lower extremity    Endometriosis    Fatty liver    GERD (gastroesophageal reflux disease)    with pregnancy   H/O eye surgery    Headache(784.0)    MIGRAINES--NOT REALLY HEADACHE-MORE LIKE PRESSURE SENSATION IN HEAD-FEELS DIZZIY AND  FAINT AS THE PRESSURE RESOLVES   Hernia, incisional, RLQ, s/p lap repair Sep 2013 09/02/2011   History of vertigo 03/22/2018   Hx of migraines 10/19/2011   Hyperlipidemia    Hypertension    Leg pain, right    "like bad Charley horse"   Multiple food allergies    Panic disorder without agoraphobia    Rash    HANDS, ARMS --STATES HX OF RASH EVER SINCE CHILDBIRTH/PREGNANCY.  STATES THE RASH OFTEN OCCURS WHEN SHE IS REALLY STRESSED."goes  and comes-presently left ring finger"   Restless leg syndrome    DIAGNOSED BY SLEEP STUDY - PT TOLD SHE DID NOT HAVE SLEEP APNEA   Right rotator cuff tear    PAIN IN RIGHT SHOULDER   SBO (small bowel obstruction) (HCC) 06/03/2012   Shortness of breath    Spastic hemiplegia affecting dominant side (HCC)    Stomach problems    Stroke (HCC)    Ventral hernia    RIGHT LOWER QUADRANT-CAUSING SOME PAIN   Weakness of right side of body     PAST SURGICAL HISTORY: Past Surgical History:  Procedure Laterality Date   APPLICATION OF WOUND VAC N/A 05/25/2015   Procedure: APPLICATION OF WOUND VAC;  Surgeon: Karie Soda, MD;  Location: [facility];  Service: General;  Laterality: N/A;   CARDIAC CATHETERIZATION     CESAREAN SECTION  2012   COLONOSCOPY     CORONARY STENT INTERVENTION N/A 09/05/2020   Procedure: CORONARY STENT INTERVENTION;  Surgeon: Elder Negus, MD;  Location: [facility];  Service: Cardiovascular;  Laterality: N/A;   CORONARY ULTRASOUND/IVUS N/A 09/05/2020   Procedure: Intravascular Ultrasound/IVUS;  Surgeon: Elder Negus, MD;  Location: [facility];  Service: Cardiovascular;  Laterality: N/A;   DIAGNOSTIC LAPAROSCOPY     ESOPHAGOGASTRODUODENOSCOPY N/A 06/03/2012   Procedure: ESOPHAGOGASTRODUODENOSCOPY (EGD);  Surgeon: Charna Elizabeth, MD;  Location: [facility] ENDOSCOPY;  Service: Endoscopy;  Laterality: N/A;   EXCISION MASS ABDOMINAL N/A 05/25/2015   Procedure: ABDOMINAL WALL EXPLORATION EXCISION OF SEROMA REMOVAL OF REDUNDANT SKIN ;  Surgeon: Karie Soda, MD;  Location: [facility];  Service: General;  Laterality: N/A;   EYE SURGERY     Eye laser for vessel hemorrhaging   fybroid removal     HERNIA REPAIR  10/03/2011   ventral hernia repair   INSERTION OF MESH N/A 02/04/2013   Procedure: INSERTION OF MESH;  Surgeon: Ardeth Sportsman, MD;  Location: [facility];  Service: General;  Laterality: N/A;   LEFT HEART CATH AND CORONARY ANGIOGRAPHY N/A 09/05/2020   Procedure:  LEFT HEART CATH AND CORONARY ANGIOGRAPHY;  Surgeon: Elder Negus, MD;  Location: [facility];  Service: Cardiovascular;  Laterality: N/A;   UMBILICAL HERNIA REPAIR N/A 02/04/2013   Procedure: LAPAROSCOPIC ventral wall hernia repair LAPAROSCOPIC LYSIS OF ADHESIONS laparoscopic exploration of abdomen ;  Surgeon: Ardeth Sportsman, MD;  Location: [facility];  Service: General;  Laterality: N/A;   UPPER GASTROINTESTINAL ENDOSCOPY  URETER REVISION     Bilateral "twisted"   UTERINE FIBROID SURGERY     2 SURGERIES FOR FIBROIDS   VENTRAL HERNIA REPAIR  10/03/2011   Procedure: LAPAROSCOPIC VENTRAL HERNIA;  Surgeon: Ardeth Sportsman, MD;  Location: [facility];  Service: General;  Laterality: N/A;    FAMILY HISTORY: The patient family history includes Alcoholism in her mother; Allergic rhinitis in her mother; Anxiety disorder in her mother; Bipolar disorder in her mother; Colon cancer in her paternal uncle; Depression in her father and mother; Diabetes in her father and maternal grandmother; Drug abuse in her father and mother; Eating disorder in her mother; Eczema in her mother and sister; Hyperlipidemia in her paternal grandmother; Kidney disease in her father; Stroke in her paternal grandmother; Urticaria in her mother and sister.  SOCIAL HISTORY:  The patient  reports that she has never smoked. She has never used smokeless tobacco. She reports that she does not currently use drugs. She reports that she does not drink alcohol.  REVIEW OF SYSTEMS: Review of Systems  Constitutional: Positive for weight gain.  Cardiovascular:  Negative for chest pain, claudication, dyspnea on exertion, irregular heartbeat, leg swelling, near-syncope, orthopnea, palpitations, paroxysmal nocturnal dyspnea and syncope.  Respiratory:  Negative for shortness of breath.   Hematologic/Lymphatic: Negative for bleeding problem.  Musculoskeletal:  Positive for falls and joint pain. Negative for muscle cramps and myalgias.   Neurological:  Negative for dizziness and light-headedness.    PHYSICAL EXAM:    08/12/2022    3:49 PM 08/12/2022    2:59 PM 08/12/2022    2:51 PM  Vitals with BMI  Height 5\' 6"   5\' 6"   Weight 250 lbs  250 lbs  BMI 40.37  40.37  Systolic 149 141 161  Diastolic 81 82 84  Pulse 79  76    Physical Exam  Constitutional: No distress.  Age appropriate, hemodynamically stable.   Neck: No JVD present.  Cardiovascular: Normal rate, regular rhythm, S1 normal, S2 normal, intact distal pulses and normal pulses. Exam reveals no gallop, no S3 and no S4.  No murmur heard. Pulmonary/Chest: Effort normal and breath sounds normal. No stridor. She has no wheezes. She has no rales.  Abdominal: Soft. Bowel sounds are normal. She exhibits no distension. There is no abdominal tenderness.  Abdominal obesity  Musculoskeletal:        General: No edema.     Cervical back: Neck supple.     Comments: Left lower extremity in a boot.  Neurological: She is alert and oriented to person, place, and time. She has intact cranial nerves (2-12).  Skin: Skin is warm and moist.     CARDIAC DATABASE: EKG: August 12, 2022: Sinus rhythm, 75 bpm, nonspecific T wave changes  Echocardiogram: 09/06/2020:  1. Left ventricular ejection fraction, by estimation, is 60 to 65%. The left ventricle has normal function. The left ventricle has no regional wall motion abnormalities. There is mild left ventricular hypertrophy. Left ventricular diastolic parameters  are indeterminate. Elevated left atrial pressure.   2. Right ventricular systolic function is normal. The right ventricular size is normal.   3. The mitral valve is grossly normal. No evidence of mitral valve regurgitation. No evidence of mitral stenosis.   4. The aortic valve is tricuspid. Aortic valve regurgitation is not visualized. Mild aortic valve sclerosis is present, with no evidence of aortic valve stenosis.   5. The inferior vena cava is normal in size with  greater than 50% respiratory variability, suggesting right  atrial pressure of 3 mmHg.   Stress Testing: Lexiscan myoview stress test 08/21/2017:  1. Lexiscan stress test was performed. Exercise capacity was not assessed. Resting BP 148/90 mmHg, peak effect BP 168/90 mmHg. Stress symptoms included dizziness, nausea, headache, chest tightness.  2. The overall quality of the study is good. There is no evidence of abnormal lung activity. Stress and rest SPECT images demonstrate homogeneous tracer distribution throughout the myocardium. Gated SPECT imaging reveals normal myocardial thickening and wall motion. The left ventricular ejection fraction was normal calculated as 45%, although visually appears normal.  3. Low risk study.  Heart Catheterization: Coronary intervention 09/05/2020: LM: Normal LAD: Distal apical 70% stenosis (non-culprit, very small caliber) Lcx: Mid Lcx 30% disease with just after a small OM1 branch (non-culprit)        Prox OM2 with 95% stenosis (culprit) RCA: Small caliber PDA with diffuse 60% disease LVEF 50-55% with apical inferolateral hypokinesis   Successful percutaneous coronary intervention OM2        PTCA and stent placement 2.25 X 12 mm Onyx drug-eluting stent        IVUS guided Post dilatation using 2.25 mm [place] at 22 atm and 2.5 mm [place] at 16 atm  ABI 08/21/2017: This exam reveals normal perfusion of both the  lower extremity (RABI 1.27 and LABI 1.20 with biphasic waveform).  ABI may be falsely elevated in patients with DM and medial calcinosis.  LABORATORY DATA:    Latest Ref Rng & Units 06/05/2021   11:13 AM 12/14/2020   10:05 AM 09/11/2020   12:20 AM  CBC  WBC 4.0 - 10.5 K/uL 8.8  9.4  5.7   Hemoglobin 12.0 - 15.0 g/dL 86.5  78.4  69.6   Hematocrit 36.0 - 46.0 % 43.8  46.9  34.3   Platelets 150 - 400 K/uL 342  371  299        Latest Ref Rng & Units 06/05/2021   11:13 AM 12/14/2020   10:05 AM 11/19/2020    4:35 PM  CMP  Glucose 70 - 99 mg/dL 295  284   132   BUN 6 - 20 mg/dL 9  19  19    Creatinine 0.44 - 1.00 mg/dL 4.40  1.02  7.25   Sodium 135 - 145 mmol/L 140  142  140   Potassium 3.5 - 5.1 mmol/L 3.7  3.9  4.2   Chloride 98 - 111 mmol/L 107  108  102   CO2 22 - 32 mmol/L 24  18  27    Calcium 8.9 - 10.3 mg/dL 8.9  9.5  9.0   Total Protein 6.5 - 8.1 g/dL 6.8  8.3    Total Bilirubin 0.3 - 1.2 mg/dL 0.9  1.4    Alkaline Phos 38 - 126 U/L 49  60    AST 15 - 41 U/L 18  19    ALT 0 - 44 U/L 15  16      Lipid Panel  Lab Results  Component Value Date   CHOL 175 09/06/2020   HDL 56 09/06/2020   LDLCALC 92 09/06/2020   TRIG 137 09/06/2020   CHOLHDL 3.1 09/06/2020     No components found for: "NTPROBNP" No results for input(s): "PROBNP" in the last 8760 hours.  No results for input(s): "TSH" in the last 8760 hours.  BMP No results for input(s): "NA", "K", "CL", "CO2", "GLUCOSE", "BUN", "CREATININE", "CALCIUM", "GFRNONAA", "GFRAA" in the last 8760 hours.   HEMOGLOBIN A1C Lab Results  Component  Value Date   HGBA1C 8.4 (H) 08/13/2020   MPG 194.38 08/13/2020    Lipid Panel   2021-04-02    Cholesterol 151   <200  Cholesterol / HDL Ratio 3.60   0.00-4.44  HDL Cholesterol 42   >39  LDL Cholesterol (Calculation) 62   <130  LDL/HDL Ratio 1.5   <3.3  Non-HDL Cholesterol 109   <130  Triglycerides 235   <150  Microalbumin/Creatinine, Random Urine Sample   2021-04-02    Albumin/Creatinine Ratio, Urine 2598   0-30  Creatinine, Urine 72.8      Microalbumin, Urine, Random 189.1      Comprehensive Metabolic Panel RS In-house   2021-04-02    Albumin 3.5   3.4-5.0  Albumin/Globulin Ratio 0.9   1.1-2.5  Alkaline Phosphatase 68   25-150  ALT (SGPT) 25   <6-78  AST (SGOT) 19   0-40  Bilirubin, Total 0.6   0.2-1.0  BUN 13   7-18  BUN/Creatinine Ratio 11.2   11.0-26.0  Calcium 9.5   8.5-10.1  Chloride 103   98-107  CO2 30   21-32  Creatinine 1.16   0.55-1.02  GFR/Black 63   >59  GFR/White 54   >59  Globulin, Calculated 3.9    1.5-4.6  Glucose 143   74-106  Potassium 4.0   3.5-5.1  Protein 7.4   6.4-8.2  Sodium 141   136-145   -    Past Orders:        NWG:NFAOZHYQMV A1C (Order Date - 06/24/2022) (Collection Date & Time - 06/24/2022 08:42 AM)           Value Reference Range          Hemoglobin A1c 10.9 H 4.8-5.6 - %       HQI:ONGEXBMWUXLKG Metabolic Panel (CMP) (Order Date - 06/24/2022) (Collection Date & Time - 06/24/2022 08:42 AM)           Value Reference Range          Glucose 268 H 70-99 - mg/dL          BUN 21   4-01 - mg/dL          Creatinine 0.27 H 0.57-1.00 - mg/dL          eGFR 42 L >25 - mL/min/1.73          BUN/Creatinine Ratio 14   9-23 -          Sodium 139   134-144 - mmol/L          Potassium 4.2   3.5-5.2 - mmol/L          Chloride 103   96-106 - mmol/L          Carbon Dioxide, Total 20   20-29 - mmol/L          Calcium 9.1   8.7-10.2 - mg/dL          Protein, Total 6.7   6.0-8.5 - g/dL          Albumin 3.8   3.8-4.9 - g/dL          Globulin, Total 2.9   1.5-4.5 - g/dL          A/G Ratio 1.3   -          Bilirubin, Total 0.3   0.0-1.2 - mg/dL          Alkaline Phosphatase 107   44-121 - IU/L  AST (SGOT) 11   0-40 - IU/L          ALT (SGPT) 13   0-32 - IU/L       VOZ:DGUYQIHK Hgb-Fingerstick (Order Date - 02/10/2022) (Collection Date & Time - 02/10/2022)          Result: 11.0%       Imaging:Eye Exam (Order Date - 12/10/2021) (Performed Date - 12/10/2021)           Value Reference Range          Retinopathy? Yes           VQQ:VZDGLOVFIEPP/IRJJOACZYS, Random Urine Sample (Order Date - 04/02/2021) (Collection Date & Time - 04/02/2021 10:30 AM)           Value Reference Range          Albumin/Creatinine Ratio, Urine 2598 H 0-30 - ug/mg          Creatinine, Urine 72.8   - mg/dL          Microalbumin, Urine, Random 189.1   - mg/dL     IMPRESSION:    [id number]   1. Atherosclerosis of native coronary artery of native heart without angina pectoris  I25.10 EKG 12-Lead     PCV ECHOCARDIOGRAM COMPLETE    2. Hx of heart artery stent  Z95.5     3. Essential hypertension  I10     4. History of stroke  Z86.73     5. Type 2 diabetes mellitus with diabetic neuropathy, with long-term current use of insulin (HCC)  E11.40    Z79.4     6. Long-term insulin use (HCC)  Z79.4     7. History of non-ST elevation myocardial infarction (NSTEMI)  I25.2     8. Class 3 severe obesity due to excess calories with serious comorbidity and body mass index (BMI) of 40.0 to 44.9 in adult [facility])  E66.01    Z68.41         RECOMMENDATIONS: Nadya LATIQUA GRANDPRE is a 89 y.o. African-American female whose past medical history and cardiac risk factors include: OSA on CPAP, hypertension, insulin-dependent diabetes mellitus, h/o stroke 2011, obesity, NSTEMI treated with OM2 PCI 09/05/2020, hyperlipidemia, obesity due to excess calories.  Atherosclerosis of native coronary artery of native heart without angina pectoris History of NSTEMI Hx of PTCA and PCI of the second OM branch Currently denies anginal chest pain or heart failure symptoms. EKG sinus rhythm without underlying ischemia or injury pattern. Currently on dual antiplatelet therapy, interventional cardiology recommended dual antiplatelet therapy as tolerated due to the size of her stent.  She has not had any bleeding complications. Plan echo prior to next office visit to reevaluate LVEF, regional wall motion abnormalities, and valvular heart disease. Reemphasized the importance of secondary prevention with focus on improving her modifiable cardiovascular risk factors such as glycemic control, lipid management, blood pressure control, weight loss. I have asked her to speak to her endocrinology provider with regards to reinitiation of Ozempic at a lower dose given the cardiovascular and weight loss benefits.  When she had her symptoms she was on 2 mg q. Weekly.  If she were to restart it would recommend that she does not exceed 1.5 mg  dosage. Outside labs independently reviewed and noted above  Essential hypertension Office blood pressures within acceptable limits. Slightly higher secondary to pain given the recent fall and left leg boot. Medications reconciled. Monitor for now  Type 2 diabetes mellitus with diabetic neuropathy, with long-term current use  of insulin (HCC) Currently not on ACE inhibitor/ARB secondary to swelling?/Angioedema. Medications reconciled. Would recommend a goal LDL <55 mg/dL given her history of CAD, CVA, diabetes. As discussed above would recommend reinitiation of Ozempic at a lower dose to avoid symptoms that she had.  Patient will discuss further with her provider.   Class 3 severe obesity due to excess calories with serious comorbidity and body mass index (BMI) of 40.0 to 44.9 in adult [facility]) Body mass index is 40.35 kg/m. I reviewed with her importance of diet, regular physical activity/exercise, weight loss.   Patient is educated on the importance of increasing physical activity gradually as tolerated with a goal of moderate intensity exercise for 30 minutes a day 5 days a week.  FINAL MEDICATION LIST END OF ENCOUNTER: No orders of the defined types were placed in this encounter.   Medications Discontinued During This Encounter  Medication Reason   metoCLOPramide (REGLAN) 5 MG tablet      Current Outpatient Medications:    albuterol (VENTOLIN HFA) 108 (90 Base) MCG/ACT inhaler, Inhale 2 puffs into the lungs every 4 (four) hours as needed for wheezing or shortness of breath., Disp: 18 g, Rfl: 1   allopurinol (ZYLOPRIM) 100 MG tablet, Take 1 tablet by mouth daily., Disp: , Rfl:    amLODipine (NORVASC) 5 MG tablet, Take 1 tablet (5 mg total) by mouth daily., Disp: 90 tablet, Rfl: 0   aspirin EC 81 MG tablet, Take 81 mg by mouth at bedtime. Swallow whole., Disp: , Rfl:    atorvastatin (LIPITOR) 40 MG tablet, Take 1 tablet (40 mg total) by mouth at bedtime., Disp: 90 tablet, Rfl: 0    carvedilol (COREG) 25 MG tablet, Take 1 tablet by mouth 2 (two) times daily with a meal., Disp: , Rfl:    cetirizine (ZYRTEC) 10 MG tablet, Take 10 mg by mouth daily as needed for allergies (itching)., Disp: , Rfl:    clopidogrel (PLAVIX) 75 MG tablet, TAKE ONE TABLET BY MOUTH ONCE DAILY, Disp: 30 tablet, Rfl: 0   colchicine 0.6 MG tablet, Take 0.6 mg by mouth daily., Disp: , Rfl:    Continuous Blood Gluc Sensor (FREESTYLE LIBRE 2 SENSOR) MISC, Inject 1 Device into the skin every 14 (fourteen) days., Disp: , Rfl:    DULoxetine (CYMBALTA) 20 MG capsule, Take 20-40 mg by mouth See admin instructions. Take 20 mg by mouth in the morning and 40 mg at bedtime, Disp: , Rfl:    empagliflozin (JARDIANCE) 25 MG TABS tablet, 1 tablet, Disp: , Rfl:    EPINEPHrine (AUVI-Q) 0.3 mg/0.3 mL IJ SOAJ injection, Inject 0.3 mg into the muscle as needed for anaphylaxis., Disp: 1 each, Rfl: 1   famotidine (PEPCID) 20 MG tablet, Take 1 tablet by mouth 2 (two) times daily., Disp: , Rfl:    furosemide (LASIX) 40 MG tablet, Take 40 mg by mouth in the morning., Disp: , Rfl:    gabapentin (NEURONTIN) 300 MG capsule, Take 300 mg by mouth at bedtime., Disp: , Rfl:    hydrALAZINE (APRESOLINE) 10 MG tablet, TAKE ONE TABLET BY MOUTH THREE TIMES DAILY, Disp: 90 tablet, Rfl: 0   Insulin Aspart FlexPen (NOVOLOG) 100 UNIT/ML, Inject 0-25 Units into the skin 3 (three) times daily before meals. Dose per sliding scale., Disp: , Rfl:    Insulin Glargine (BASAGLAR KWIKPEN) 100 UNIT/ML, Inject 22 Units into the skin at bedtime., Disp: , Rfl:    isosorbide mononitrate (IMDUR) 60 MG 24 hr tablet, TAKE ONE TABLET BY  MOUTH ONCE DAILY, Disp: 90 tablet, Rfl: 3   meloxicam (MOBIC) 7.5 MG tablet, Take 1 tablet (7.5 mg total) by mouth daily., Disp: 30 tablet, Rfl: 0   ondansetron (ZOFRAN-ODT) 4 MG disintegrating tablet, Take 1 tablet (4 mg total) by mouth every 8 (eight) hours as needed for nausea or vomiting., Disp: 30 tablet, Rfl: 1   pantoprazole  (PROTONIX) 40 MG tablet, Take 1 tablet (40 mg total) by mouth daily. Please schedule an office visit for further refills, Disp: 30 tablet, Rfl: 1   Prucalopride Succinate (MOTEGRITY) 2 MG TABS, Take 1 tablet (2 mg total) by mouth daily. Lot: [id number], ExP: 10-2023, Disp: 7 tablet, Rfl: 0   spironolactone (ALDACTONE) 25 MG tablet, Take 25 mg by mouth daily., Disp: , Rfl:    tiZANidine (ZANAFLEX) 2 MG tablet, TAKE ONE TABLET BY MOUTH EVERYDAY AT BEDTIME, Disp: 90 tablet, Rfl: 1   traMADol (ULTRAM) 50 MG tablet, Take 1 tablet by mouth as needed., Disp: , Rfl:    Vitamin D, Cholecalciferol, 25 MCG (1000 UT) CAPS, 1 capsule, Disp: , Rfl:   Orders Placed This Encounter  Procedures   EKG 12-Lead   PCV ECHOCARDIOGRAM COMPLETE   There are no Patient Instructions on file for this visit.   --Continue cardiac medications as reconciled in final medication list. --Return in about 1 year (around 08/12/2023) for Annual follow up visit, CAD. Or sooner if needed. --Continue follow-up with your primary care physician regarding the management of your other chronic comorbid conditions.  Patient's questions and concerns were addressed to her satisfaction. She voices understanding of the instructions provided during this encounter.   This note was created using a voice recognition software as a result there may be grammatical errors inadvertently enclosed that do not reflect the nature of this encounter. Every attempt is made to correct such errors.  Tessa Lerner, [place], [facility]  Pager:  [phone] Office: [phone]

## 2022-08-12 NOTE — Progress Notes (Signed)
Subjective:    Patient ID: Tina [name], female    DOB: 09/21/1970, 52 y.o.   MRN: [id number]  HPI 52 year old female with history of bilateral pontine infarcts remote, she has resultant chronic right hemiparesis.  She also has a vestibular dysfunction central. More recently she had a fall on 07/21/2022 she was evaluated by orthopedics.  Her fall was in [place].  She has followed up with orthopedic hand surgery.  Left ring finger trigger finger injection.  Feels like she has similar complaints on the right side. Has also been treated for ankle sprain by podiatry.  Meloxicam was prescribed by podiatry Has not picked up Mobic yet  Patient feels like since that fall she has had some exacerbation of pain is mainly left-sided lumbar pain.  She does have some pain going down the leg but this is only into the thigh.  No new numbness tingling.  She does have paresthesias following both strokes. Pain Inventory Average Pain 8 Pain Right Now 8 My pain is sharp and stabbing  In the last 24 hours, has pain interfered with the following? General activity 10 Relation with others 10 Enjoyment of life 10 What TIME of day is your pain at its worst? morning , daytime, evening, and night Sleep (in general) Poor  Pain is worse with: walking, bending, sitting, and standing Pain improves with:  nothing Relief from Meds:  no pain medications  Family History  Problem Relation Age of Onset   Diabetes Father    Kidney disease Father    Depression Father    Drug abuse Father    Allergic rhinitis Mother    Eczema Mother    Urticaria Mother    Depression Mother    Anxiety disorder Mother    Bipolar disorder Mother    Alcoholism Mother    Drug abuse Mother    Eating disorder Mother    Diabetes Maternal Grandmother    Hyperlipidemia Paternal Grandmother    Stroke Paternal Grandmother    Eczema Sister    Urticaria Sister    Colon cancer Paternal Uncle    Other Neg Hx    Angioedema Neg Hx     Asthma Neg Hx    Colon polyps Neg Hx    Esophageal cancer Neg Hx    Rectal cancer Neg Hx    Stomach cancer Neg Hx    Social History   Socioeconomic History   Marital status: Married    Spouse name: Jeannett Senior   Number of children: 3   Years of education: BA degree   Highest education level: Not on file  Occupational History   Occupation: stay at home mom    Employer: UNEMPLOYED  Tobacco Use   Smoking status: Never   Smokeless tobacco: Never  Vaping Use   Vaping status: Never Used  Substance and Sexual Activity   Alcohol use: No    Alcohol/week: 0.0 standard drinks of alcohol   Drug use: Not Currently   Sexual activity: Yes    Birth control/protection: None  Other Topics Concern   Not on file  Social History Narrative   Patient is married with 2 children.   Patient is right handed.   Patient has her  BA degree.   Patient drinks 1 cup daily.   Social Determinants of Health   Financial Resource Strain: Not on file  Food Insecurity: No Food Insecurity (07/27/2019)   Hunger Vital Sign    Worried About Running Out of Food in the Last Year:  Never true    Ran Out of Food in the Last Year: Never true  Transportation Needs: No Transportation Needs (07/27/2019)   PRAPARE - Administrator, Civil Service (Medical): No    Lack of Transportation (Non-Medical): No  Physical Activity: Not on file  Stress: Not on file  Social Connections: Not on file   Past Surgical History:  Procedure Laterality Date   APPLICATION OF WOUND VAC N/A 05/25/2015   Procedure: APPLICATION OF WOUND VAC;  Surgeon: Karie Soda, MD;  Location: [facility];  Service: General;  Laterality: N/A;   CARDIAC CATHETERIZATION     CESAREAN SECTION  2012   COLONOSCOPY     CORONARY STENT INTERVENTION N/A 09/05/2020   Procedure: CORONARY STENT INTERVENTION;  Surgeon: Elder Negus, MD;  [facility]: [facility];  Service: Cardiovascular;  Laterality: N/A;   CORONARY ULTRASOUND/IVUS N/A 09/05/2020    Procedure: Intravascular Ultrasound/IVUS;  Surgeon: Elder Negus, MD;  Location: [facility];  Service: Cardiovascular;  Laterality: N/A;   DIAGNOSTIC LAPAROSCOPY     ESOPHAGOGASTRODUODENOSCOPY N/A 06/03/2012   Procedure: ESOPHAGOGASTRODUODENOSCOPY (EGD);  Surgeon: Charna Elizabeth, MD;  Location: [facility];  Service: Endoscopy;  Laterality: N/A;   EXCISION MASS ABDOMINAL N/A 05/25/2015   Procedure: ABDOMINAL WALL EXPLORATION EXCISION OF SEROMA REMOVAL OF REDUNDANT SKIN ;  Surgeon: Karie Soda, MD;  Location: [facility];  Service: General;  Laterality: N/A;   EYE SURGERY     Eye laser for vessel hemorrhaging   fybroid removal     HERNIA REPAIR  10/03/2011   ventral hernia repair   INSERTION OF MESH N/A 02/04/2013   Procedure: INSERTION OF MESH;  Surgeon: Ardeth Sportsman, MD;  Location: [facility];  Service: General;  Laterality: N/A;   LEFT HEART CATH AND CORONARY ANGIOGRAPHY N/A 09/05/2020   Procedure: LEFT HEART CATH AND CORONARY ANGIOGRAPHY;  Surgeon: Elder Negus, MD;  [facility]: [facility];  Service: Cardiovascular;  Laterality: N/A;   UMBILICAL HERNIA REPAIR N/A 02/04/2013   Procedure: LAPAROSCOPIC ventral wall hernia repair LAPAROSCOPIC LYSIS OF ADHESIONS laparoscopic exploration of abdomen ;  Surgeon: Ardeth Sportsman, MD;  Location: [facility];  Service: General;  Laterality: N/A;   UPPER GASTROINTESTINAL ENDOSCOPY     URETER REVISION     Bilateral "twisted"   UTERINE FIBROID SURGERY     2 SURGERIES FOR FIBROIDS   VENTRAL HERNIA REPAIR  10/03/2011   Procedure: LAPAROSCOPIC VENTRAL HERNIA;  Surgeon: Ardeth Sportsman, MD;  Location: [facility];  Service: General;  Laterality: N/A;   Past Surgical History:  Procedure Laterality Date   APPLICATION OF WOUND VAC N/A 05/25/2015   Procedure: APPLICATION OF WOUND VAC;  Surgeon: Karie Soda, MD;  Location: [facility];  Service: General;  Laterality: N/A;   CARDIAC CATHETERIZATION     CESAREAN SECTION  2012   COLONOSCOPY      CORONARY STENT INTERVENTION N/A 09/05/2020   Procedure: CORONARY STENT INTERVENTION;  Surgeon: Elder Negus, MD;  [facility];  Service: Cardiovascular;  Laterality: N/A;   CORONARY ULTRASOUND/IVUS N/A 09/05/2020   Procedure: Intravascular Ultrasound/IVUS;  Surgeon: Elder Negus, MD;  [facility];  Service: Cardiovascular;  Laterality: N/A;   DIAGNOSTIC LAPAROSCOPY     ESOPHAGOGASTRODUODENOSCOPY N/A 06/03/2012   Procedure: ESOPHAGOGASTRODUODENOSCOPY (EGD);  Surgeon: Charna Elizabeth, MD;  Location: [facility];  Service: Endoscopy;  Laterality: N/A;   EXCISION MASS ABDOMINAL N/A 05/25/2015   Procedure: ABDOMINAL WALL EXPLORATION  EXCISION OF SEROMA REMOVAL OF REDUNDANT SKIN ;  Surgeon: Karie Soda, MD;  Location: [facility];  Service: General;  Laterality: N/A;   EYE SURGERY     Eye laser for vessel hemorrhaging   fybroid removal     HERNIA REPAIR  10/03/2011   ventral hernia repair   INSERTION OF MESH N/A 02/04/2013   Procedure: INSERTION OF MESH;  Surgeon: Ardeth Sportsman, MD;  Location: [facility];  Service: General;  Laterality: N/A;   LEFT HEART CATH AND CORONARY ANGIOGRAPHY N/A 09/05/2020   Procedure: LEFT HEART CATH AND CORONARY ANGIOGRAPHY;  Surgeon: Elder Negus, MD;  [facility];  Service: Cardiovascular;  Laterality: N/A;   UMBILICAL HERNIA REPAIR N/A 02/04/2013   Procedure: LAPAROSCOPIC ventral wall hernia repair LAPAROSCOPIC LYSIS OF ADHESIONS laparoscopic exploration of abdomen ;  Surgeon: Ardeth Sportsman, MD;  Location: [facility];  Service: General;  Laterality: N/A;   UPPER GASTROINTESTINAL ENDOSCOPY     URETER REVISION     Bilateral "twisted"   UTERINE FIBROID SURGERY     2 SURGERIES FOR FIBROIDS   VENTRAL HERNIA REPAIR  10/03/2011   Procedure: LAPAROSCOPIC VENTRAL HERNIA;  Surgeon: Ardeth Sportsman, MD;  Location: [facility];  Service: General;  Laterality: N/A;   Past Medical History:  Diagnosis Date   Allergy     Anemia    DURING MENSES--HAS HEAVY BLEEDING WITH PERODS   Angioedema 08/13/2020   Anxiety    Back pain, chronic    "ongoing"   Blood transfusion    IN 2012  AFTER C -SECTION   Cerebral thrombosis with cerebral infarction (HCC) 06/2009   RIGHT SIDED WEAKNESS ( ARM AND LEG ) AND SPASMS-remains with slight weakness and vertigo.   Constipation    Depression    Diabetes mellitus    Diabetic neuropathy (HCC)    BOTH FEET --COMES AND GOES   Edema, lower extremity    Endometriosis    Fatty liver    GERD (gastroesophageal reflux disease)    with pregnancy   H/O eye surgery    Headache(784.0)    MIGRAINES--NOT REALLY HEADACHE-MORE LIKE PRESSURE SENSATION IN HEAD-FEELS DIZZIY AND  FAINT AS THE PRESSURE RESOLVES   Hernia, incisional, RLQ, s/p lap repair Sep 2013 09/02/2011   History of vertigo 03/22/2018   Hx of migraines 10/19/2011   Hyperlipidemia    Hypertension    Leg pain, right    "like bad Charley horse"   Multiple food allergies    Panic disorder without agoraphobia    Rash    HANDS, ARMS --STATES HX OF RASH EVER SINCE CHILDBIRTH/PREGNANCY.  STATES THE RASH OFTEN OCCURS WHEN SHE IS REALLY STRESSED."goes and comes-presently left ring finger"   Restless leg syndrome    DIAGNOSED BY SLEEP STUDY - PT TOLD SHE DID NOT HAVE SLEEP APNEA   Right rotator cuff tear    PAIN IN RIGHT SHOULDER   SBO (small bowel obstruction) (HCC) 06/03/2012   Shortness of breath    Spastic hemiplegia affecting dominant side (HCC)    Stomach problems    Stroke (HCC)    Ventral hernia    RIGHT LOWER QUADRANT-CAUSING SOME PAIN   Weakness of right side of body    BP (!) 152/84   Pulse 76   Ht 5\' 6"  (1.676 m)   Wt 250 lb (113.4 kg)   SpO2 96%   BMI 40.35 kg/m   Opioid Risk Score:   Fall Risk Score:  `1  Depression  screen PHQ 2/9     09/03/2021    1:23 PM 07/11/2021   11:20 AM 05/30/2021   10:56 AM 03/26/2021   10:21 AM 01/02/2021    4:30 PM 12/25/2020    2:14 PM 11/09/2020    4:15 PM   Depression screen PHQ 2/9  Decreased Interest 1 0 1 1 0 1 3  Down, Depressed, Hopeless 1 0 1 1 0 1 3  PHQ - 2 Score 2 0 2 2 0 2 6  Altered sleeping       3  Tired, decreased energy       3  Change in appetite       2  Feeling bad or failure about yourself        0  Trouble concentrating       2  Moving slowly or fidgety/restless       1  Suicidal thoughts       0  PHQ-9 Score       17  Difficult doing work/chores       Somewhat difficult     Review of Systems  Musculoskeletal:  Positive for back pain.       Lt leg pain  All other systems reviewed and are negative.      Objective:   Physical Exam  Patient with reduced pinprick sensation bilaterally lower extremity which is chronic finding Negative straight leg raising bilaterally Motor strength is 4/5 in the right hip flexor knee extensor ankle dorsiflexor and 5/5 on the left side.  Except that left ankle dorsiflexor and plantar flexor were not tested due to cam walker boot. Ambulates without assistive device but does use a cam walker boot.  Sacral thrust (prone) : Positive left greater than right  FABER's: Positive left Distraction (supine): Negative Thigh thrust test: Positive left Tenderness palpation into the buttock area as well in the gluteal musculature left greater than right side.     Assessment & Plan:  1.  Sacroiliac discomfort left side likely due to fall also has ankle injury requiring cam walker boot which is likely exacerbating her symptoms.  Positive provocative tests x 3 SI joint left side. Referral to PT.  Patient trying to avoid steroid injections due to brittle diabetes. I will set see her back in 1 month.  Will check some x-rays of her lumbar spine

## 2022-08-15 ENCOUNTER — Ambulatory Visit: Payer: PPO | Admitting: Physical Medicine & Rehabilitation

## 2022-08-21 DIAGNOSIS — E113592 Type 2 diabetes mellitus with proliferative diabetic retinopathy without macular edema, left eye: Secondary | ICD-10-CM | POA: Diagnosis not present

## 2022-08-21 DIAGNOSIS — H26491 Other secondary cataract, right eye: Secondary | ICD-10-CM | POA: Diagnosis not present

## 2022-08-22 ENCOUNTER — Other Ambulatory Visit: Payer: Self-pay | Admitting: Podiatry

## 2022-08-22 DIAGNOSIS — S93402A Sprain of unspecified ligament of left ankle, initial encounter: Secondary | ICD-10-CM

## 2022-08-22 DIAGNOSIS — R262 Difficulty in walking, not elsewhere classified: Secondary | ICD-10-CM

## 2022-08-22 DIAGNOSIS — R600 Localized edema: Secondary | ICD-10-CM

## 2022-08-22 DIAGNOSIS — M7752 Other enthesopathy of left foot: Secondary | ICD-10-CM

## 2022-08-22 DIAGNOSIS — S90425A Blister (nonthermal), left lesser toe(s), initial encounter: Secondary | ICD-10-CM

## 2022-08-23 ENCOUNTER — Encounter (HOSPITAL_COMMUNITY): Payer: Self-pay

## 2022-08-23 ENCOUNTER — Ambulatory Visit (HOSPITAL_COMMUNITY)
Admission: EM | Admit: 2022-08-23 | Discharge: 2022-08-24 | Disposition: A | Discharge status: Home / Self Care | Payer: PPO | Attending: Emergency Medicine | Admitting: Emergency Medicine

## 2022-08-23 ENCOUNTER — Other Ambulatory Visit: Payer: Self-pay

## 2022-08-23 DIAGNOSIS — H5712 Ocular pain, left eye: Secondary | ICD-10-CM

## 2022-08-23 DIAGNOSIS — H53142 Visual discomfort, left eye: Secondary | ICD-10-CM | POA: Diagnosis not present

## 2022-08-23 DIAGNOSIS — H44002 Unspecified purulent endophthalmitis, left eye: Secondary | ICD-10-CM | POA: Insufficient documentation

## 2022-08-23 DIAGNOSIS — H43392 Other vitreous opacities, left eye: Secondary | ICD-10-CM | POA: Diagnosis not present

## 2022-08-23 NOTE — ED Triage Notes (Signed)
Pt arrived from home via POV c/o left eye swelling and pain 10/10 s/p routine eye injections that patient receives. Pt states that it has never caused this much pain or swelling before.

## 2022-08-24 ENCOUNTER — Ambulatory Visit (HOSPITAL_COMMUNITY)
Admission: EM | Disposition: A | Discharge status: Home / Self Care | Payer: Self-pay | Source: Home / Self Care | Attending: Emergency Medicine

## 2022-08-24 ENCOUNTER — Emergency Department (HOSPITAL_COMMUNITY): Payer: PPO | Admitting: Certified Registered Nurse Anesthetist

## 2022-08-24 DIAGNOSIS — E7849 Other hyperlipidemia: Secondary | ICD-10-CM | POA: Diagnosis not present

## 2022-08-24 DIAGNOSIS — H109 Unspecified conjunctivitis: Secondary | ICD-10-CM

## 2022-08-24 DIAGNOSIS — H43392 Other vitreous opacities, left eye: Secondary | ICD-10-CM | POA: Diagnosis not present

## 2022-08-24 DIAGNOSIS — H44002 Unspecified purulent endophthalmitis, left eye: Secondary | ICD-10-CM | POA: Diagnosis not present

## 2022-08-24 DIAGNOSIS — I5032 Chronic diastolic (congestive) heart failure: Secondary | ICD-10-CM | POA: Diagnosis not present

## 2022-08-24 DIAGNOSIS — G4733 Obstructive sleep apnea (adult) (pediatric): Secondary | ICD-10-CM | POA: Diagnosis not present

## 2022-08-24 DIAGNOSIS — I110 Hypertensive heart disease with heart failure: Secondary | ICD-10-CM

## 2022-08-24 HISTORY — PX: PARS PLANA VITRECTOMY: SHX2166

## 2022-08-24 LAB — GLUCOSE, CAPILLARY: Glucose-Capillary: 244 mg/dL — ABNORMAL HIGH (ref 70–99)

## 2022-08-24 LAB — AEROBIC/ANAEROBIC CULTURE W GRAM STAIN (SURGICAL/DEEP WOUND)

## 2022-08-24 SURGERY — PARS PLANA VITRECTOMY 25 GAUGE FOR ENDOPHTHALMITIS
Anesthesia: General | Laterality: Left

## 2022-08-24 MED ORDER — DEXAMETHASONE SODIUM PHOSPHATE 10 MG/ML IJ SOLN
INTRAMUSCULAR | Status: AC
  Filled 2022-08-24: qty 1

## 2022-08-24 MED ORDER — CEFTAZIDIME INTRAVITREAL INJECTION 2.25 MG/0.1 ML
2.2500 mg | INTRAVITREAL | Status: DC
  Filled 2022-08-24: qty 0.1

## 2022-08-24 MED ORDER — ONDANSETRON HCL 4 MG/2ML IJ SOLN
INTRAMUSCULAR | Status: DC | PRN
Start: 2022-08-24 — End: 2022-08-24
  Administered 2022-08-24: 4 mg via INTRAVENOUS

## 2022-08-24 MED ORDER — PHENYLEPHRINE HCL 2.5 % OP SOLN
1.0000 [drp] | Freq: Once | OPHTHALMIC | Status: DC
  Administered 2022-08-24: 1 [drp] via OPHTHALMIC
  Filled 2022-08-24: qty 2

## 2022-08-24 MED ORDER — OXYCODONE HCL 5 MG PO TABS: 5.0000 mg | ORAL_TABLET | Freq: Once | ORAL | Status: DC | PRN

## 2022-08-24 MED ORDER — LIDOCAINE 2% (20 MG/ML) 5 ML SYRINGE
INTRAMUSCULAR | Status: AC
  Filled 2022-08-24: qty 5

## 2022-08-24 MED ORDER — HYALURONIDASE HUMAN 150 UNIT/ML IJ SOLN
INTRAMUSCULAR | Status: AC
  Filled 2022-08-24: qty 1

## 2022-08-24 MED ORDER — INSULIN ASPART 100 UNIT/ML IJ SOLN
INTRAMUSCULAR | Status: DC | PRN
Start: 2022-08-24 — End: 2022-08-24
  Administered 2022-08-24: 6 [IU] via SUBCUTANEOUS

## 2022-08-24 MED ORDER — EPINEPHRINE PF 1 MG/ML IJ SOLN
INTRAMUSCULAR | Status: AC
  Filled 2022-08-24: qty 1

## 2022-08-24 MED ORDER — ONDANSETRON HCL 4 MG/2ML IJ SOLN
INTRAMUSCULAR | Status: AC
  Filled 2022-08-24: qty 2

## 2022-08-24 MED ORDER — LIDOCAINE 2% (20 MG/ML) 5 ML SYRINGE
INTRAMUSCULAR | Status: DC | PRN
  Administered 2022-08-24: 60 mg via INTRAVENOUS

## 2022-08-24 MED ORDER — ONDANSETRON HCL 4 MG/2ML IJ SOLN: 4.0000 mg | Freq: Once | INTRAMUSCULAR | Status: DC | PRN

## 2022-08-24 MED ORDER — FENTANYL CITRATE (PF) 250 MCG/5ML IJ SOLN
INTRAMUSCULAR | Status: AC
  Filled 2022-08-24: qty 5

## 2022-08-24 MED ORDER — FLUORESCEIN SODIUM 1 MG OP STRP
1.0000 | ORAL_STRIP | Freq: Once | OPHTHALMIC | Status: DC
  Filled 2022-08-24: qty 1

## 2022-08-24 MED ORDER — TETRACAINE HCL 0.5 % OP SOLN
1.0000 [drp] | Freq: Once | OPHTHALMIC | Status: AC
  Administered 2022-08-24: 1 [drp] via OPHTHALMIC
  Filled 2022-08-24: qty 4

## 2022-08-24 MED ORDER — TETRACAINE HCL 0.5 % OP SOLN
OPHTHALMIC | Status: AC
Start: 2022-08-24 — End: ?
  Filled 2022-08-24: qty 4

## 2022-08-24 MED ORDER — ROCURONIUM BROMIDE 10 MG/ML (PF) SYRINGE
PREFILLED_SYRINGE | INTRAVENOUS | Status: AC
  Filled 2022-08-24: qty 10

## 2022-08-24 MED ORDER — BSS IO SOLN
INTRAOCULAR | Status: AC
  Filled 2022-08-24: qty 15

## 2022-08-24 MED ORDER — VANCOMYCIN INTRAVITREAL INJECTION 1 MG/0.1 ML
1.0000 mg | INTRAOCULAR | Status: DC
  Filled 2022-08-24: qty 0.1

## 2022-08-24 MED ORDER — PROPOFOL 10 MG/ML IV BOLUS
INTRAVENOUS | Status: DC | PRN
Start: 2022-08-24 — End: 2022-08-24
  Administered 2022-08-24: 160 mg via INTRAVENOUS

## 2022-08-24 MED ORDER — CEFTAZIDIME INTRAVITREAL INJECTION 2.25 MG/0.1 ML
INTRAVITREAL | Status: DC | PRN
  Administered 2022-08-24: 2.25 mg via INTRAVITREAL

## 2022-08-24 MED ORDER — LACTATED RINGERS IV SOLN: INTRAVENOUS | Status: DC | PRN

## 2022-08-24 MED ORDER — SODIUM HYALURONATE 10 MG/ML IO SOLUTION
PREFILLED_SYRINGE | INTRAOCULAR | Status: AC
  Filled 2022-08-24: qty 0.85

## 2022-08-24 MED ORDER — PHENYLEPHRINE HCL-NACL 20-0.9 MG/250ML-% IV SOLN
INTRAVENOUS | Status: DC | PRN
  Administered 2022-08-24: 25 ug/min via INTRAVENOUS

## 2022-08-24 MED ORDER — FLUORESCEIN SODIUM 1 MG OP STRP
ORAL_STRIP | OPHTHALMIC | Status: AC
Start: 2022-08-24 — End: 2022-08-24
  Administered 2022-08-24: 1 via OPHTHALMIC
  Filled 2022-08-24: qty 1

## 2022-08-24 MED ORDER — DEXAMETHASONE SODIUM PHOSPHATE 10 MG/ML IJ SOLN
INTRAMUSCULAR | Status: DC | PRN
  Administered 2022-08-24: 4 mg via INTRAVENOUS

## 2022-08-24 MED ORDER — ACETAMINOPHEN 10 MG/ML IV SOLN
INTRAVENOUS | Status: AC
  Filled 2022-08-24: qty 100

## 2022-08-24 MED ORDER — OXYCODONE HCL 5 MG/5ML PO SOLN: 5.0000 mg | Freq: Once | ORAL | Status: DC | PRN

## 2022-08-24 MED ORDER — ATROPINE SULFATE 1 % OP SOLN
OPHTHALMIC | Status: AC
  Filled 2022-08-24: qty 5

## 2022-08-24 MED ORDER — PROPOFOL 10 MG/ML IV BOLUS
INTRAVENOUS | Status: AC
  Filled 2022-08-24: qty 20

## 2022-08-24 MED ORDER — PHENYLEPHRINE 80 MCG/ML (10ML) SYRINGE FOR IV PUSH (FOR BLOOD PRESSURE SUPPORT)
PREFILLED_SYRINGE | INTRAVENOUS | Status: DC | PRN
  Administered 2022-08-24: 40 ug via INTRAVENOUS
  Administered 2022-08-24: 80 ug via INTRAVENOUS

## 2022-08-24 MED ORDER — VANCOMYCIN INTRAVITREAL INJECTION 1 MG/0.1 ML
INTRAOCULAR | Status: DC | PRN
  Administered 2022-08-24: 1 mg via INTRAVITREAL

## 2022-08-24 MED ORDER — HYALURONIDASE HUMAN 150 UNIT/ML IJ SOLN
INTRAMUSCULAR | Status: DC | PRN
  Administered 2022-08-24: 150 [IU] via SUBCUTANEOUS

## 2022-08-24 MED ORDER — BUPIVACAINE HCL (PF) 0.75 % IJ SOLN
INTRAMUSCULAR | Status: AC
  Filled 2022-08-24: qty 10

## 2022-08-24 MED ORDER — LEVOFLOXACIN IN D5W 750 MG/150ML IV SOLN
750.0000 mg | Freq: Once | INTRAVENOUS | Status: AC
  Administered 2022-08-24: 750 mg via INTRAVENOUS
  Filled 2022-08-24: qty 150

## 2022-08-24 MED ORDER — FENTANYL CITRATE (PF) 250 MCG/5ML IJ SOLN
INTRAMUSCULAR | Status: DC | PRN
  Administered 2022-08-24: 50 ug via INTRAVENOUS

## 2022-08-24 MED ORDER — MIDAZOLAM HCL 2 MG/2ML IJ SOLN
INTRAMUSCULAR | Status: AC
  Filled 2022-08-24: qty 2

## 2022-08-24 MED ORDER — SUCCINYLCHOLINE CHLORIDE 200 MG/10ML IV SOSY
PREFILLED_SYRINGE | INTRAVENOUS | Status: AC
  Filled 2022-08-24: qty 10

## 2022-08-24 MED ORDER — FENTANYL CITRATE (PF) 100 MCG/2ML IJ SOLN: 25.0000 ug | INTRAMUSCULAR | Status: DC | PRN

## 2022-08-24 MED ORDER — LIDOCAINE HCL 2 % IJ SOLN
INTRAMUSCULAR | Status: AC
  Filled 2022-08-24: qty 20

## 2022-08-24 MED ORDER — TROPICAMIDE 1 % OP SOLN
1.0000 [drp] | Freq: Once | OPHTHALMIC | Status: DC
  Administered 2022-08-24: 1 [drp] via OPHTHALMIC
  Filled 2022-08-24: qty 15

## 2022-08-24 MED ORDER — MORPHINE SULFATE (PF) 4 MG/ML IV SOLN
4.0000 mg | Freq: Once | INTRAVENOUS | Status: AC
  Administered 2022-08-24: 4 mg via INTRAVENOUS
  Filled 2022-08-24: qty 1

## 2022-08-24 MED ORDER — BSS IO SOLN
INTRAOCULAR | Status: DC | PRN
  Administered 2022-08-24: 500 mL via INTRAOCULAR

## 2022-08-24 MED ORDER — NA CHONDROIT SULF-NA HYALURON 40-30 MG/ML IO SOSY
INTRAOCULAR | Status: DC | PRN
  Administered 2022-08-24: .5 mL via INTRAOCULAR

## 2022-08-24 MED ORDER — BSS PLUS IO SOLN
INTRAOCULAR | Status: AC
  Filled 2022-08-24: qty 500

## 2022-08-24 MED ORDER — PHENYLEPHRINE HCL (PRESSORS) 10 MG/ML IV SOLN
INTRAVENOUS | Status: DC | PRN
  Administered 2022-08-24: 80 ug via INTRAVENOUS

## 2022-08-24 MED ORDER — ROCURONIUM BROMIDE 10 MG/ML (PF) SYRINGE
PREFILLED_SYRINGE | INTRAVENOUS | Status: DC | PRN
  Administered 2022-08-24: 100 mg via INTRAVENOUS

## 2022-08-24 MED ORDER — ACETAMINOPHEN 10 MG/ML IV SOLN
INTRAVENOUS | Status: DC | PRN
Start: 2022-08-24 — End: 2022-08-24
  Administered 2022-08-24: 1000 mg via INTRAVENOUS

## 2022-08-24 MED ORDER — TOBRAMYCIN-DEXAMETHASONE 0.3-0.1 % OP OINT
TOPICAL_OINTMENT | OPHTHALMIC | Status: AC
  Filled 2022-08-24: qty 3.5

## 2022-08-24 MED ORDER — SUGAMMADEX SODIUM 200 MG/2ML IV SOLN
INTRAVENOUS | Status: DC | PRN
  Administered 2022-08-24: 400 mg via INTRAVENOUS

## 2022-08-24 SURGICAL SUPPLY — 65 items
ANTIFOG SOL W/FOAM PAD STRL (MISCELLANEOUS) ×1
APL SWBSTK 6 STRL LF DISP (MISCELLANEOUS) ×1
APPLICATOR COTTON TIP 6 STRL (MISCELLANEOUS) ×1 IMPLANT
APPLICATOR COTTON TIP 6IN STRL (MISCELLANEOUS) ×1 IMPLANT
BAG COUNTER SPONGE SURGICOUNT (BAG) ×1 IMPLANT
BAG SPNG CNTER NS LX DISP (BAG) ×1
BAND WRIST GAS GREEN (MISCELLANEOUS) IMPLANT
BLADE MVR KNIFE 20G (BLADE) IMPLANT
CABLE BIPOLOR RESECTION CORD (MISCELLANEOUS) IMPLANT
CANNULA ANT CHAM MAIN (OPHTHALMIC RELATED) IMPLANT
CANNULA DUAL BORE 23G (CANNULA) IMPLANT
CANNULA DUALBORE 25G (CANNULA) IMPLANT
CANNULA VLV SOFT TIP 25G (OPHTHALMIC) ×1 IMPLANT
CANNULA VLV SOFT TIP 25GA (OPHTHALMIC) ×1 IMPLANT
CAUTERY EYE LOW TEMP 1300F FIN (OPHTHALMIC RELATED) IMPLANT
CLSR STERI-STRIP ANTIMIC 1/2X4 (GAUZE/BANDAGES/DRESSINGS) ×1 IMPLANT
COVER MAYO STAND STRL (DRAPES) IMPLANT
DRAPE HALF SHEET 40X57 (DRAPES) ×1 IMPLANT
DRAPE INCISE 51X51 W/FILM STRL (DRAPES) IMPLANT
DRAPE OPHTHALMIC 77X100 STRL (CUSTOM PROCEDURE TRAY) IMPLANT
DRAPE RETRACTOR (MISCELLANEOUS) ×1 IMPLANT
ERASER HMR WETFIELD 23G BP (MISCELLANEOUS) IMPLANT
FORCEPS ECKARDT ILM 25G SERR (OPHTHALMIC RELATED) IMPLANT
FORCEPS GRIESHABER ILM 25G A (INSTRUMENTS) IMPLANT
GAS AUTO FILL CONSTEL (OPHTHALMIC)
GAS AUTO FILL CONSTELLATION (OPHTHALMIC) IMPLANT
GLOVE BIOGEL PI IND STRL 6.5 (GLOVE) IMPLANT
GLOVE SURG SYN 7.5 E (GLOVE) ×2 IMPLANT
GLOVE SURG SYN 7.5 PF PI (GLOVE) ×2 IMPLANT
GOWN STRL REUS W/ TWL LRG LVL3 (GOWN DISPOSABLE) ×1 IMPLANT
GOWN STRL REUS W/TWL LRG LVL3 (GOWN DISPOSABLE) ×1
HANDLE PNEUMATIC FOR CONSTEL (OPHTHALMIC) IMPLANT
KIT BASIN OR (CUSTOM PROCEDURE TRAY) ×1 IMPLANT
KIT TURNOVER KIT B (KITS) ×1 IMPLANT
LENS BIOM SUPER VIEW SET DISP (MISCELLANEOUS) ×1 IMPLANT
MICROPICK 25G (MISCELLANEOUS)
NDL 18GX1X1/2 (RX/OR ONLY) (NEEDLE) ×1 IMPLANT
NDL 25GX 5/8IN NON SAFETY (NEEDLE) ×1 IMPLANT
NDL 27GX1/2 REG BEVEL ECLIP (NEEDLE) ×1 IMPLANT
NDL FILTER BLUNT 18X1 1/2 (NEEDLE) IMPLANT
NDL HYPO 30X.5 LL (NEEDLE) ×2 IMPLANT
NDL RETROBULBAR 25GX1.5 (NEEDLE) ×1 IMPLANT
NEEDLE 18GX1X1/2 (RX/OR ONLY) (NEEDLE) ×1 IMPLANT
NEEDLE 25GX 5/8IN NON SAFETY (NEEDLE) ×1 IMPLANT
NEEDLE 27GX1/2 REG BEVEL ECLIP (NEEDLE) ×1 IMPLANT
NEEDLE FILTER BLUNT 18X1 1/2 (NEEDLE) IMPLANT
NEEDLE HYPO 30X.5 LL (NEEDLE) ×2 IMPLANT
NEEDLE RETROBULBAR 25GX1.5 (NEEDLE) ×1 IMPLANT
NS IRRIG 1000ML POUR BTL (IV SOLUTION) ×1 IMPLANT
PACK VITRECTOMY CUSTOM (CUSTOM PROCEDURE TRAY) ×1 IMPLANT
PAD ARMBOARD 7.5X6 YLW CONV (MISCELLANEOUS) ×2 IMPLANT
PAK PIK VITRECTOMY CVS 25GA (OPHTHALMIC) ×1 IMPLANT
PICK MICROPICK 25G (MISCELLANEOUS) IMPLANT
PROBE ENDO DIATHERMY 25G (MISCELLANEOUS) IMPLANT
PROBE LASER ILLUM FLEX CVD 25G (OPHTHALMIC) IMPLANT
SCRAPER DIAMOND 25GA (OPHTHALMIC RELATED) IMPLANT
SOL ANTI FOG 6CC (MISCELLANEOUS) ×1 IMPLANT
SOLUTION ANTFG W/FOAM PAD STRL (MISCELLANEOUS) IMPLANT
STOPCOCK 4 WAY LG BORE MALE ST (IV SETS) IMPLANT
STRIP CLOSURE SKIN 1/2X4 (GAUZE/BANDAGES/DRESSINGS) IMPLANT
SUT VICRYL 7 0 TG140 8 (SUTURE) ×1 IMPLANT
SYR 10ML LL (SYRINGE) IMPLANT
SYR TB 1ML LUER SLIP (SYRINGE) ×1 IMPLANT
TOWEL GREEN STERILE FF (TOWEL DISPOSABLE) ×1 IMPLANT
WATER STERILE IRR 1000ML POUR (IV SOLUTION) ×1 IMPLANT

## 2022-08-24 NOTE — H&P (Signed)
Date of examination:  08/24/22  Indication for surgery: Endophthalmitis left eye  Pertinent past medical history:  Past Medical History:  Diagnosis Date   Allergy    Anemia    DURING MENSES--HAS HEAVY BLEEDING WITH PERODS   Angioedema 08/13/2020   Anxiety    Back pain, chronic    "ongoing"   Blood transfusion    IN 2012  AFTER C -SECTION   Cerebral thrombosis with cerebral infarction (HCC) 06/2009   RIGHT SIDED WEAKNESS ( ARM AND LEG ) AND SPASMS-remains with slight weakness and vertigo.   Constipation    Depression    Diabetes mellitus    Diabetic neuropathy (HCC)    BOTH FEET --COMES AND GOES   Edema, lower extremity    Endometriosis    Fatty liver    GERD (gastroesophageal reflux disease)    with pregnancy   H/O eye surgery    Headache(784.0)    MIGRAINES--NOT REALLY HEADACHE-MORE LIKE PRESSURE SENSATION IN HEAD-FEELS DIZZIY AND  FAINT AS THE PRESSURE RESOLVES   Hernia, incisional, RLQ, s/p lap repair Sep 2013 09/02/2011   History of vertigo 03/22/2018   Hx of migraines 10/19/2011   Hyperlipidemia    Hypertension    Leg pain, right    "like bad Charley horse"   Multiple food allergies    Panic disorder without agoraphobia    Rash    HANDS, ARMS --STATES HX OF RASH EVER SINCE CHILDBIRTH/PREGNANCY.  STATES THE RASH OFTEN OCCURS WHEN SHE IS REALLY STRESSED."goes and comes-presently left ring finger"   Restless leg syndrome    DIAGNOSED BY SLEEP STUDY - PT TOLD SHE DID NOT HAVE SLEEP APNEA   Right rotator cuff tear    PAIN IN RIGHT SHOULDER   SBO (small bowel obstruction) (HCC) 06/03/2012   Shortness of breath    Spastic hemiplegia affecting dominant side (HCC)    Stomach problems    Stroke (HCC)    Ventral hernia    RIGHT LOWER QUADRANT-CAUSING SOME PAIN   Weakness of right side of body     Pertinent ocular history:   Intravitreal injection left eye 2 day prior  Pertinent family history:  Family History  Problem Relation Age of Onset   Diabetes Father     Kidney disease Father    Depression Father    Drug abuse Father    Allergic rhinitis Mother    Eczema Mother    Urticaria Mother    Depression Mother    Anxiety disorder Mother    Bipolar disorder Mother    Alcoholism Mother    Drug abuse Mother    Eating disorder Mother    Diabetes Maternal Grandmother    Hyperlipidemia Paternal Grandmother    Stroke Paternal Grandmother    Eczema Sister    Urticaria Sister    Colon cancer Paternal Uncle    Other Neg Hx    Angioedema Neg Hx    Asthma Neg Hx    Colon polyps Neg Hx    Esophageal cancer Neg Hx    Rectal cancer Neg Hx    Stomach cancer Neg Hx     General:  Healthy appearing patient in no distress.     Eyes:    Acuity   OS 20/70    External: Within normal limits      Anterior segment: Conj hyperemia, 1+ cell, inferior corneal infiltrate       Impression: Endophthalmitis left eye  Plan:  Vitrectomy with injection of intravitreal antibiotics  Carmela Rima,  MD

## 2022-08-24 NOTE — Anesthesia Procedure Notes (Signed)
Procedure Name: Intubation Date/Time: 08/24/2022 3:56 AM  Performed by: Jodell Cipro, CRNAPre-anesthesia Checklist: Patient identified, Emergency Drugs available, Suction available and Patient being monitored Patient Re-evaluated:Patient Re-evaluated prior to induction Oxygen Delivery Method: Circle System Utilized Preoxygenation: Pre-oxygenation with 100% oxygen Induction Type: IV induction Ventilation: Mask ventilation without difficulty and Oral airway inserted - appropriate to patient size Laryngoscope Size: Mac and 4 Grade View: Grade II Tube type: Oral Tube size: 7.0 mm Number of attempts: 1 Airway Equipment and Method: Stylet and Oral airway Placement Confirmation: ETT inserted through vocal cords under direct vision, positive ETCO2 and breath sounds checked- equal and bilateral Secured at: 21 cm Tube secured with: Tape Dental Injury: Teeth and Oropharynx as per pre-operative assessment

## 2022-08-24 NOTE — ED Provider Notes (Signed)
[place] EMERGENCY DEPARTMENT AT [facility] Provider Note   CSN: [id number] Arrival date & time: 08/23/22  2234     History  Chief Complaint  Patient presents with   Eye Pain    Tina [name] is a 52 y.o. female.  The history is provided by the patient.  Eye Pain  Tina [name] is a 52 y.o. female who presents to the Emergency Department complaining of eye pain.  She presents to the emergency department accompanied by her husband for evaluation of severe left eye pain.  She states that she had an injection in her left eye on Thursday.  She typically has some discomfort when she has the injections.  On Friday and Saturday her pain significantly worsened with severe photophobia.  She states that she can still see but has difficulty seeing.  She does not wear corrective lenses.  She has a history of diabetes.  She reports increased watering from the eye.      Home Medications Prior to Admission medications   Medication Sig Start Date End Date Taking? Authorizing Provider  albuterol (VENTOLIN HFA) 108 (90 Base) MCG/ACT inhaler Inhale 2 puffs into the lungs every 4 (four) hours as needed for wheezing or shortness of breath. 08/16/20   Hetty Blend, FNP  allopurinol (ZYLOPRIM) 100 MG tablet Take 1 tablet by mouth daily.    [provider]  amLODipine (NORVASC) 5 MG tablet Take 1 tablet (5 mg total) by mouth daily. 09/06/20 08/12/22  Tolia, Sunit, DO  aspirin EC 81 MG tablet Take 81 mg by mouth at bedtime. Swallow whole.    [provider]  atorvastatin (LIPITOR) 40 MG tablet Take 1 tablet (40 mg total) by mouth at bedtime. 09/06/20 08/12/22  Tolia, Sunit, DO  carvedilol (COREG) 25 MG tablet Take 1 tablet by mouth 2 (two) times daily with a meal.    [provider]  cetirizine (ZYRTEC) 10 MG tablet Take 10 mg by mouth daily as needed for allergies (itching). 08/16/20   [provider]  clopidogrel (PLAVIX) 75 MG tablet TAKE ONE TABLET BY  MOUTH ONCE DAILY 04/10/22   Tolia, Sunit, DO  colchicine 0.6 MG tablet Take 0.6 mg by mouth daily. 06/24/21   [provider]  Continuous Blood Gluc Sensor (FREESTYLE LIBRE 2 SENSOR) MISC Inject 1 Device into the skin every 14 (fourteen) days. 08/23/19   [provider]  DULoxetine (CYMBALTA) 20 MG capsule Take 20-40 mg by mouth See admin instructions. Take 20 mg by mouth in the morning and 40 mg at bedtime    [provider]  empagliflozin (JARDIANCE) 25 MG TABS tablet 1 tablet 06/27/21   [provider]  EPINEPHrine (AUVI-Q) 0.3 mg/0.3 mL IJ SOAJ injection Inject 0.3 mg into the muscle as needed for anaphylaxis. 08/16/20   Hetty Blend, FNP  famotidine (PEPCID) 20 MG tablet Take 1 tablet by mouth 2 (two) times daily.    [provider]  furosemide (LASIX) 40 MG tablet Take 40 mg by mouth in the morning.    [provider]  gabapentin (NEURONTIN) 300 MG capsule Take 300 mg by mouth at bedtime. 12/14/20   [provider]  hydrALAZINE (APRESOLINE) 10 MG tablet TAKE ONE TABLET BY MOUTH THREE TIMES DAILY 04/10/22   Tolia, Sunit, DO  Insulin Aspart FlexPen (NOVOLOG) 100 UNIT/ML Inject 0-25 Units into the skin 3 (three) times daily before meals. Dose per sliding scale. 09/20/20   [provider]  Insulin  Glargine (BASAGLAR KWIKPEN) 100 UNIT/ML Inject 22 Units into the skin at bedtime. 08/15/20   Zannie Cove, MD  isosorbide mononitrate (IMDUR) 60 MG 24 hr tablet TAKE ONE TABLET BY MOUTH ONCE DAILY 01/20/22   Tolia, Sunit, DO  meloxicam (MOBIC) 7.5 MG tablet Take 1 tablet (7.5 mg total) by mouth daily. 08/11/22   McCaughan, Dia D, DPM  ondansetron (ZOFRAN-ODT) 4 MG disintegrating tablet Take 1 tablet (4 mg total) by mouth every 8 (eight) hours as needed for nausea or vomiting. 12/17/21   Armbruster, Willaim Rayas, MD  pantoprazole (PROTONIX) 40 MG tablet Take 1 tablet (40 mg total) by mouth daily. Please schedule an office visit for further refills  07/10/22   Armbruster, Willaim Rayas, MD  Prucalopride Succinate (MOTEGRITY) 2 MG TABS Take 1 tablet (2 mg total) by mouth daily. Lot: [id number], ExP: 10-2023 12/17/21   ArmbrusterWillaim Rayas, MD  spironolactone (ALDACTONE) 25 MG tablet Take 25 mg by mouth daily. 06/24/21   [provider]  tiZANidine (ZANAFLEX) 2 MG tablet TAKE ONE TABLET BY MOUTH EVERYDAY AT BEDTIME 07/07/22   Kirsteins, Victorino Sparrow, MD  traMADol (ULTRAM) 50 MG tablet Take 1 tablet by mouth as needed.    [provider]  Vitamin D, Cholecalciferol, 25 MCG (1000 UT) CAPS 1 capsule 04/04/21   [provider]  diphenhydrAMINE (BENADRYL) 25 MG tablet Take 1 tablet (25 mg total) by mouth every 8 (eight) hours as needed for up to 2 days for itching. 08/14/20 08/15/20  Zannie Cove, MD      Allergies    Amlodipine benzoate, Contrast media [iodinated contrast media], Iodine, Iohexol, Midazolam hcl, Other, Shellfish allergy, Shellfish-derived products, Valsartan, Metformin and related, Farxiga [dapagliflozin], Hydralazine, Saxagliptin, Spironolactone, Avandia [rosiglitazone maleate], Geodon [ziprasidone], Kiwi extract, and Latex    Review of Systems   Review of Systems  Eyes:  Positive for pain.  All other systems reviewed and are negative.   Physical Exam Updated Vital Signs BP 136/66 (BP Location: Right Arm)   Pulse 73   Temp (!) 96.8 F (36 C)   Resp 19   Ht 5\' 6"  (1.676 m)   Wt 113.3 kg   LMP  (LMP Unknown)   SpO2 95%   BMI 40.32 kg/m  Physical Exam Vitals and nursing note reviewed.  Constitutional:      Appearance: She is well-developed.     Comments: Appears uncomfortable  HENT:     Head: Normocephalic and atraumatic.     Comments: Pupils pinpoint bilaterally, EOMI.  There is conjunctival injection in the left eye with severe photophobia.  No uptake on fluorescein staining.  Left eye with pressure of 20, right with pressure of 14. Cardiovascular:     Rate and Rhythm: Normal rate and regular rhythm.      Heart sounds: No murmur heard. Pulmonary:     Effort: Pulmonary effort is normal. No respiratory distress.     Breath sounds: Normal breath sounds.  Abdominal:     Palpations: Abdomen is soft.     Tenderness: There is no abdominal tenderness. There is no guarding or rebound.  Musculoskeletal:        General: No tenderness.  Skin:    General: Skin is warm and dry.  Neurological:     Mental Status: She is alert and oriented to person, place, and time.  Psychiatric:        Behavior: Behavior normal.     ED Results / Procedures / Treatments   Labs (all labs ordered  are listed, but only abnormal results are displayed) Labs Reviewed  GLUCOSE, CAPILLARY - Abnormal; Notable for the following components:      Result Value   Glucose-Capillary 244 (*)    All other components within normal limits  AEROBIC/ANAEROBIC CULTURE W GRAM STAIN (SURGICAL/DEEP WOUND)    EKG None  Radiology DG Ankle Complete Left  Result Date: 08/22/2022 Please see detailed radiograph report in office note.   Procedures Procedures    Medications Ordered in ED Medications  oxyCODONE (Oxy IR/ROXICODONE) immediate release tablet 5 mg (has no administration in time range)    Or  oxyCODONE (ROXICODONE) 5 MG/5ML solution 5 mg (has no administration in time range)  fentaNYL (SUBLIMAZE) injection 25-50 mcg (has no administration in time range)  ondansetron (ZOFRAN) injection 4 mg (has no administration in time range)  morphine (PF) 4 MG/ML injection 4 mg (4 mg Intravenous Given 08/24/22 0139)  tetracaine (PONTOCAINE) 0.5 % ophthalmic solution 1 drop (1 drop Both Eyes Given 08/24/22 0140)  fluorescein 1 MG ophthalmic strip (1 strip  Given 08/24/22 0140)  levofloxacin (LEVAQUIN) IVPB 750 mg (750 mg Intravenous New Bag/Given 08/24/22 0536)    ED Course/ Medical Decision Making/ A&P                                 Medical Decision Making Risk Prescription drug management.   Patient here for evaluation of  left eye pain, she did have a recent injection on Thursday.  She has profound photophobia, which limits ophthalmologic exam.  No evidence of acute angle-closure glaucoma.  No evidence of corneal abrasion.  Discussed with Dr. Allena Katz with ophthalmology-will evaluate the patient in the emergency department.  Plan to take to OR for intervention.         Final Clinical Impression(s) / ED Diagnoses Final diagnoses:  Left eye pain    Rx / DC Orders ED Discharge Orders     None         Tilden Fossa, MD 08/24/22 [phone]

## 2022-08-24 NOTE — Anesthesia Preprocedure Evaluation (Addendum)
Anesthesia Evaluation  Patient identified by MRN, date of birth, ID band Patient awake    Reviewed: Allergy & Precautions, NPO status , Patient's Chart, lab work & pertinent test results, reviewed documented beta blocker date and time   History of Anesthesia Complications Negative for: history of anesthetic complications  Airway Mallampati: III  TM Distance: >3 FB Neck ROM: Full    Dental  (+) Dental Advisory Given, Chipped,    Pulmonary sleep apnea (noncompliant)    Pulmonary exam normal        Cardiovascular hypertension, Pt. on medications and Pt. on home beta blockers + Past MI  Normal cardiovascular exam   '22 TTE - EF 60 to 65%. There is mild left ventricular hypertrophy. Mild aortic valve sclerosis is present, with no evidence of aortic valve stenosis.     Neuro/Psych  Headaches PSYCHIATRIC DISORDERS Anxiety Depression    CVA, Residual Symptoms    GI/Hepatic Neg liver ROS,GERD  Medicated and Controlled,,  Endo/Other  diabetes, Type 2, Insulin Dependent, Oral Hypoglycemic Agents  Morbid obesity  Renal/GU negative Renal ROS     Musculoskeletal negative musculoskeletal ROS (+)    Abdominal  (+) + obese  Peds  Hematology  On plavix    Anesthesia Other Findings   Reproductive/Obstetrics                              Anesthesia Physical Anesthesia Plan  ASA: 3 and emergent  Anesthesia Plan: General   Post-op Pain Management: Ofirmev IV (intra-op)*   Induction: Intravenous  PONV Risk Score and Plan: 3 and Treatment may vary due to age or medical condition, Ondansetron and Dexamethasone  Airway Management Planned: Oral ETT  Additional Equipment: None  Intra-op Plan:   Post-operative Plan: Extubation in OR  Informed Consent: I have reviewed the patients History and Physical, chart, labs and discussed the procedure including the risks, benefits and alternatives for the  proposed anesthesia with the patient or authorized representative who has indicated his/her understanding and acceptance.     Dental advisory given  Plan Discussed with: CRNA and Anesthesiologist  Anesthesia Plan Comments:          Anesthesia Quick Evaluation

## 2022-08-24 NOTE — Discharge Instructions (Addendum)
DO NOT SLEEP ON BACK  SLEEP ON SIDE WITH NOSE TO PILLOW, SLEEP WITH HEAD ELEVATED  DURING DAY KEEP UPRIGHT

## 2022-08-24 NOTE — Op Note (Signed)
Tina [name] 08/24/2022 Diagnosis: Endophthalmitis left eye  Procedure: Pars Plana Vitrectomy and Vitreous biopsy and injection of intravitreal antibiotics Operative Eye:  left eye  Surgeon: Harrold Donath Estimated Blood Loss: minimal Specimens for Pathology:  None Complications: none   The  patient was prepped and draped in the usual fashion for ocular surgery on the  left eye .  A lid speculum was placed.  Infusion line and trocar was placed at the 4 o'clock position approximately 3.5 mm from the surgical limbus.   The infusion line was allowed to run and then clamped when placed at the cannula opening. The line was inserted and secured to the drape with an adhesive strip.   Active trocars/cannula were placed at the 10 and 2 o'clock positions approximately 3.5 mm from the surgical limbus. The cannula was visualized in the vitreous cavity.  The light pipe and vitreous cutter were inserted into the vitreous cavity and a dry tap was performed without infusion to obtain a 2 cc vitreous biopsy.  Additional biopsy was performed with the infusion turned on.  Biopsy was sent to microbiology for gram stain and culture.  The core vitrectomy was performed.  Peripheral strands of vitreous were removed.  A partial air-fluid exchange was performed.  2.25mg  of Ceftazadime and 1mg  of Vancomycin each in 0.1 ml were injected in the vitreous space.    The superior cannulas were sequentially removed with concommitant tamponade using a cotton tipped applicator and noted to be air tight.  The infusion line and trocar were removed and the sclerotomy was noted to be air tight with normal intraocular pressure by digital palpapation.  Subconjunctival injections of  Dexamethasone 4mg /55ml, Vancomycin and Ceftazadime were placed in the infero-medial quadrant.   The speculum and drapes were removed and the eye was patched with Polymixin/Bacitracin ophthalmic ointment. An eye shield was placed and the patient  was transferred alert and conversant with stable vital signs to the post operative recovery area.  The patient tolerated the procedure well and no complications were noted.  Harrold Donath MD

## 2022-08-24 NOTE — Transfer of Care (Signed)
Immediate Anesthesia Transfer of Care Note  Patient: Tina [name]  Procedure(s) Performed: PARS PLANA VITRECTOMY 25 GAUGE FOR ENDOPHTHALMITIS; INJECTION OF ANTIBIOTIC (Left)  Patient Location: PACU  Anesthesia Type:General  Level of Consciousness: drowsy  Airway & Oxygen Therapy: Patient Spontanous Breathing and Patient connected to nasal cannula oxygen  Post-op Assessment: Report given to RN, Post -op Vital signs reviewed and stable, and Patient moving all extremities X 4  Post vital signs: Reviewed and stable  Last Vitals:  Vitals Value Taken Time  BP 125/62 08/24/22 0508  Temp 36 C 08/24/22 0500  Pulse 74 08/24/22 0511  Resp 21 08/24/22 0511  SpO2 94 % 08/24/22 0511  Vitals shown include unfiled device data.  Last Pain:  Vitals:   08/24/22 0500  PainSc: 0-No pain         Complications: No notable events documented.

## 2022-08-24 NOTE — Anesthesia Postprocedure Evaluation (Signed)
Anesthesia Post Note  Patient: Luretha M Mancil  Procedure(s) Performed: PARS PLANA VITRECTOMY 25 GAUGE FOR ENDOPHTHALMITIS; INJECTION OF ANTIBIOTIC (Left)     Patient location during evaluation: PACU Anesthesia Type: General Level of consciousness: awake and alert Pain management: pain level controlled Vital Signs Assessment: post-procedure vital signs reviewed and stable Respiratory status: spontaneous breathing, nonlabored ventilation and respiratory function stable Cardiovascular status: blood pressure returned to baseline and stable Postop Assessment: no apparent nausea or vomiting Anesthetic complications: no   No notable events documented.  Last Vitals:  Vitals:   08/24/22 0545 08/24/22 0600  BP: 138/65 136/66  Pulse: 71 73  Resp: (!) 21 19  Temp:    SpO2: 96% 95%    Last Pain:  Vitals:   08/24/22 0600  PainSc: 0-No pain                 Beryle Lathe

## 2022-08-24 NOTE — Brief Op Note (Signed)
08/24/2022  4:42 AM  PATIENT:  Tina [name]  52 y.o. female  PRE-OPERATIVE DIAGNOSIS:  ENDOPHTHALMITIS LEFT EYE  POST-OPERATIVE DIAGNOSIS:   ENDOPHTHALMITIS LEFT EYE  PROCEDURE:  Procedure(s): PARS PLANA VITRECTOMY 25 GAUGE FOR ENDOPHTHALMITIS; INJECTION OF ANTIBIOTIC (Left)  SURGEON:  Surgeons and Role:    * Carmela Rima, MD - Primary  PHYSICIAN ASSISTANT:   ASSISTANTS: none   ANESTHESIA:   local and general  EBL:  MINIMAL   BLOOD ADMINISTERED:none  DRAINS: none   LOCAL MEDICATIONS USED:  MARCAINE    and LIDOCAINE   SPECIMEN:  Source of Specimen:  VITREOUS  DISPOSITION OF SPECIMEN:   MICROBIOLOGY  COUNTS:  YES  TOURNIQUET:  * No tourniquets in log *  DICTATION: .Note written in EPIC  PLAN OF CARE: Discharge to home after PACU  PATIENT DISPOSITION:  PACU - hemodynamically stable.   Delay start of Pharmacological VTE agent (>24hrs) due to surgical blood loss or risk of bleeding: not applicable

## 2022-08-25 ENCOUNTER — Encounter (HOSPITAL_COMMUNITY): Payer: Self-pay | Admitting: Ophthalmology

## 2022-08-25 MED FILL — Ceftazidime For Inj 1 GM: INTRAVITREAL | Qty: 0.1 | Status: AC

## 2022-08-29 ENCOUNTER — Ambulatory Visit: Payer: PPO | Admitting: Physical Medicine & Rehabilitation

## 2022-09-01 ENCOUNTER — Other Ambulatory Visit: Payer: Self-pay

## 2022-09-01 ENCOUNTER — Encounter: Payer: Self-pay | Admitting: Physical Therapy

## 2022-09-01 ENCOUNTER — Ambulatory Visit
Payer: Federal, State, Local not specified - PPO | Attending: Physical Medicine & Rehabilitation | Admitting: Physical Therapy

## 2022-09-01 DIAGNOSIS — M62838 Other muscle spasm: Secondary | ICD-10-CM | POA: Diagnosis not present

## 2022-09-01 DIAGNOSIS — M25572 Pain in left ankle and joints of left foot: Secondary | ICD-10-CM | POA: Insufficient documentation

## 2022-09-01 DIAGNOSIS — M79644 Pain in right finger(s): Secondary | ICD-10-CM | POA: Diagnosis not present

## 2022-09-01 DIAGNOSIS — M5459 Other low back pain: Secondary | ICD-10-CM

## 2022-09-01 DIAGNOSIS — R262 Difficulty in walking, not elsewhere classified: Secondary | ICD-10-CM | POA: Diagnosis not present

## 2022-09-01 DIAGNOSIS — M25552 Pain in left hip: Secondary | ICD-10-CM

## 2022-09-01 DIAGNOSIS — M533 Sacrococcygeal disorders, not elsewhere classified: Secondary | ICD-10-CM | POA: Diagnosis not present

## 2022-09-01 DIAGNOSIS — M6281 Muscle weakness (generalized): Secondary | ICD-10-CM | POA: Insufficient documentation

## 2022-09-01 DIAGNOSIS — M79642 Pain in left hand: Secondary | ICD-10-CM | POA: Diagnosis not present

## 2022-09-01 DIAGNOSIS — M25551 Pain in right hip: Secondary | ICD-10-CM | POA: Insufficient documentation

## 2022-09-01 DIAGNOSIS — M79641 Pain in right hand: Secondary | ICD-10-CM | POA: Diagnosis not present

## 2022-09-01 DIAGNOSIS — M7989 Other specified soft tissue disorders: Secondary | ICD-10-CM

## 2022-09-01 NOTE — Therapy (Signed)
OUTPATIENT PHYSICAL THERAPY THORACOLUMBAR EVALUATION   Patient Name: Tina [name] MRN: [id number] DOB:1970-05-04, 52 y.o., female Today's Date: 09/01/2022  END OF SESSION:  PT End of Session - 09/01/22 1546     Visit Number 1    Number of Visits 9   8 + eval   Date for PT Re-Evaluation 11/07/22   pushed out due to scheduling preferences   Authorization Type [employer]    PT Start Time 1535    PT Stop Time 1627    PT Time Calculation (min) 52 min    Behavior During Therapy WFL for tasks assessed/performed             Past Medical History:  Diagnosis Date   Allergy    Anemia    DURING MENSES--HAS HEAVY BLEEDING WITH PERODS   Angioedema 08/13/2020   Anxiety    Back pain, chronic    "ongoing"   Blood transfusion    IN 2012  AFTER C -SECTION   Cerebral thrombosis with cerebral infarction (HCC) 06/2009   RIGHT SIDED WEAKNESS ( ARM AND LEG ) AND SPASMS-remains with slight weakness and vertigo.   Constipation    Depression    Diabetes mellitus    Diabetic neuropathy (HCC)    BOTH FEET --COMES AND GOES   Edema, lower extremity    Endometriosis    Fatty liver    GERD (gastroesophageal reflux disease)    with pregnancy   H/O eye surgery    Headache(784.0)    MIGRAINES--NOT REALLY HEADACHE-MORE LIKE PRESSURE SENSATION IN HEAD-FEELS DIZZIY AND  FAINT AS THE PRESSURE RESOLVES   Hernia, incisional, RLQ, s/p lap repair Sep 2013 09/02/2011   History of vertigo 03/22/2018   Hx of migraines 10/19/2011   Hyperlipidemia    Hypertension    Leg pain, right    "like bad Charley horse"   Multiple food allergies    Panic disorder without agoraphobia    Rash    HANDS, ARMS --STATES HX OF RASH EVER SINCE CHILDBIRTH/PREGNANCY.  STATES THE RASH OFTEN OCCURS WHEN SHE IS REALLY STRESSED."goes and comes-presently left ring finger"   Restless leg syndrome    DIAGNOSED BY SLEEP STUDY - PT TOLD SHE DID NOT HAVE SLEEP APNEA   Right rotator cuff tear    PAIN IN RIGHT  SHOULDER   SBO (small bowel obstruction) (HCC) 06/03/2012   Shortness of breath    Spastic hemiplegia affecting dominant side (HCC)    Stomach problems    Stroke (HCC)    Ventral hernia    RIGHT LOWER QUADRANT-CAUSING SOME PAIN   Weakness of right side of body    Past Surgical History:  Procedure Laterality Date   APPLICATION OF WOUND VAC N/A 05/25/2015   Procedure: APPLICATION OF WOUND VAC;  Surgeon: Karie Soda, MD;  Location: [facility];  Service: General;  Laterality: N/A;   CARDIAC CATHETERIZATION     CESAREAN SECTION  2012   COLONOSCOPY     CORONARY STENT INTERVENTION N/A 09/05/2020   Procedure: CORONARY STENT INTERVENTION;  Surgeon: Elder Negus, MD;  Location: [facility];  Service: Cardiovascular;  Laterality: N/A;   CORONARY ULTRASOUND/IVUS N/A 09/05/2020   Procedure: Intravascular Ultrasound/IVUS;  Surgeon: Elder Negus, MD;  Location: [facility];  Service: Cardiovascular;  Laterality: N/A;   DIAGNOSTIC LAPAROSCOPY     ESOPHAGOGASTRODUODENOSCOPY N/A 06/03/2012   Procedure: ESOPHAGOGASTRODUODENOSCOPY (EGD);  Surgeon: Charna Elizabeth, MD;  Location: [facility] ENDOS[facility];  Service: Endoscopy;  Laterality:  N/A;   EXCISION MASS ABDOMINAL N/A 05/25/2015   Procedure: ABDOMINAL WALL EXPLORATION EXCISION OF SEROMA REMOVAL OF REDUNDANT SKIN ;  Surgeon: Karie Soda, MD;  Location: [facility];  Service: General;  Laterality: N/A;   EYE SURGERY     Eye laser for vessel hemorrhaging   fybroid removal     HERNIA REPAIR  10/03/2011   ventral hernia repair   INSERTION OF MESH N/A 02/04/2013   Procedure: INSERTION OF MESH;  Surgeon: Ardeth Sportsman, MD;  Location: [facility];  Service: General;  Laterality: N/A;   LEFT HEART CATH AND CORONARY ANGIOGRAPHY N/A 09/05/2020   Procedure: LEFT HEART CATH AND CORONARY ANGIOGRAPHY;  Surgeon: Elder Negus, MD;  Location: [facility];  Service: Cardiovascular;  Laterality: N/A;   PARS PLANA VITRECTOMY Left 08/24/2022    Procedure: PARS PLANA VITRECTOMY 25 GAUGE FOR ENDOPHTHALMITIS; INJECTION OF ANTIBIOTIC;  Surgeon: Carmela Rima, MD;  Location: [facility];  Service: Ophthalmology;  Laterality: Left;   UMBILICAL HERNIA REPAIR N/A 02/04/2013   Procedure: LAPAROSCOPIC ventral wall hernia repair LAPAROSCOPIC LYSIS OF ADHESIONS laparoscopic exploration of abdomen ;  Surgeon: Ardeth Sportsman, MD;  Location: [facility];  Service: General;  Laterality: N/A;   UPPER GASTROINTESTINAL ENDOSCOPY     URETER REVISION     Bilateral "twisted"   UTERINE FIBROID SURGERY     2 SURGERIES FOR FIBROIDS   VENTRAL HERNIA REPAIR  10/03/2011   Procedure: LAPAROSCOPIC VENTRAL HERNIA;  Surgeon: Ardeth Sportsman, MD;  Location: [facility];  Service: General;  Laterality: N/A;   Patient Active Problem List   Diagnosis Date Noted   Poor compliance with CPAP treatment 09/04/2021   Elevated lipase 09/04/2021   Belching 09/04/2021   Gastroesophageal reflux disease 09/04/2021   Hypertensive emergency 09/09/2020   Chest pain    Elevated troponin    Status post coronary artery stent placement    Non-ST elevation (NSTEMI) myocardial infarction (HCC) 09/05/2020   NSTEMI (non-ST elevated myocardial infarction) (HCC) 09/05/2020   Hypersomnia, persistent 09/04/2020   OSA on CPAP 09/04/2020   Shortness of breath 08/18/2020   Allergy to alpha-gal 08/18/2020   Seasonal and perennial allergic rhinitis 08/18/2020   Allergy with anaphylaxis due to food 08/18/2020   Pruritus 08/18/2020   Pollen-food allergy 08/18/2020   Allergic reaction 08/13/2020   Angioedema 08/13/2020   Type 2 macular telangiectasis, bilateral 01/03/2020   Excessive daytime sleepiness 09/12/2019   Sleep paralysis 09/12/2019   Hypnopompic hallucination 09/12/2019   Abnormal dreams 09/12/2019   Diabetic macular edema of right eye with proliferative retinopathy associated with type 2 diabetes mellitus (HCC) 07/04/2019   Stable treated proliferative diabetic retinopathy of left eye  determined by examination associated with type 2 diabetes mellitus (HCC) 07/04/2019   Long-term insulin use (HCC) 04/07/2019   Depression 04/13/2018   History of vertigo 03/22/2018   Laboratory examination 03/22/2018   Chronic diastolic (congestive) heart failure (HCC) 03/22/2018   Other hyperlipidemia 02/15/2018   Vitamin D deficiency 12/31/2017   Class 2 severe obesity with serious comorbidity and body mass index (BMI) of 39.0 to 39.9 in adult (HCC) 11/18/2017   Gait disturbance, post-stroke 04/23/2017   Strain of gluteus medius 04/23/2017   OSA (obstructive sleep apnea) 10/17/2015   Periodic limb movement disorder (PLMD) 10/17/2015   Essential hypertension 05/26/2015   Diabetes mellitus (HCC) 05/26/2015   Abdominal wall mass of right lower quadrant 05/25/2015   Hemiparesis affecting right side as late effect of stroke (HCC) 11/09/2014  Abdominal wall seroma    Sepsis due to undetermined organism (HCC) 03/06/2014   Nausea vomiting and diarrhea 03/06/2014   Sepsis (HCC) 03/06/2014   Tension headache 09/01/2013   Nausea 03/21/2013   Altered bowel habits 02/28/2013   Spastic hemiplegia affecting dominant side (HCC) 02/16/2013   Recurrent ventral incisional hernia s/p closure/repair w mesh 02/04/2013 01/26/2013   Constipation, chronic 01/26/2013   Abdominal pain, chronic, right lower quadrant 06/03/2012   History of CVA (cerebrovascular accident) 06/03/2012   HTN (hypertension) 06/03/2012   Diabetes mellitus type II, uncontrolled (HCC) 10/16/2011   Obesity (BMI 30-39.9) 09/02/2011   Rotator cuff tear 08/25/2011   Sciatica 06/10/2011   Paresthesias in right hand 06/10/2011   Lumbago 05/09/2011   Paresthesias 05/09/2011    PCP: Irena Reichmann, DO  REFERRING PROVIDER: Erick Colace, MD  REFERRING DIAG: M53.3 (ICD-10-CM) - Sacroiliac dysfunction  Rationale for Evaluation and Treatment: Rehabilitation  THERAPY DIAG:  Sacrococcygeal disorders, not elsewhere  classified  Other muscle spasm  Other specified soft tissue disorders  Other low back pain  Pain in left hip  ONSET DATE: July 21, 2022 (had a fall and pain started after)  SUBJECTIVE:                                                                                                                                                                                           SUBJECTIVE STATEMENT: Patient had a fall walking out of her garage on July 21, 2022 resulting in hitting the ground on her left knee then landing on her stomach and left side of her face.  She reports that she fell because there is a small 1 inch step off leaving the garage, but the transition is the same color as the garage floor and she kept walking at the same pace and this step off threw her forward.  Since then she has had left midline low back pain radiating into her left hip and down to the knee.  Blood glucose - 178 about 1 hour ago  PERTINENT HISTORY:  DM2, HTN, L pontine and basal ganglia CVA, vertigo, NSTEMI  PAIN:  Are you having pain? Yes: NPRS scale: 5/10 Pain location: left side of back into glute region Pain description: burning Aggravating factors: walking, cleaning, standing too long, sitting too long Relieving factors: not much, sometimes repositioning or stretching  PRECAUTIONS: Fall and Other: glucose sensor on left arm (day 2 of 14 day sensor); some visual limitation  RED FLAGS: None   WEIGHT BEARING RESTRICTIONS: No  FALLS:  Has patient fallen in last 6 months? Yes. 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  PATIENT SURVEYS:  Modified Oswestry 26/50 = moderate perceived disability   SCREENING FOR RED FLAGS: Bowel or bladder incontinence: No Spinal tumors: No Cauda equina syndrome: No Compression fracture: No Abdominal aneurysm: No  COGNITION: Overall cognitive status: Within functional limits for tasks assessed     SENSATION: Light touch: WFL and pt reports pins/needles down entire front of left LE  POSTURE: rounded shoulders and forward head  PALPATION: Tender over left hip and left PSIS region, diffusely sore over low back region  LUMBAR ROM:   AROM eval  Flexion Patient off balance in CAM boot; assessment deferred  Extension   Right lateral flexion   Left lateral flexion   Right rotation   Left rotation    (Blank rows = not tested)  LOWER EXTREMITY ROM:     Active  Right eval Left eval  Hip flexion Grossly WFL; unable to assess left ankle due to boot  Hip extension   Hip abduction   Hip adduction   Hip internal rotation   Hip external rotation   Knee flexion   Knee extension   Ankle dorsiflexion   Ankle plantarflexion    Ankle inversion    Ankle eversion     (Blank rows = not tested)  LOWER EXTREMITY MMT:    MMT Right eval Left eval  Hip flexion 3+/5 3/5; pain  Hip extension    Hip abduction    Hip adduction    Hip internal rotation    Hip external rotation    Knee flexion    Knee extension 4/5 3+/5; pain  Ankle dorsiflexion 5/5   Ankle plantarflexion    Ankle inversion    Ankle eversion     (Blank rows = not tested)  LUMBAR SPECIAL TESTS:  Stork standing: unable to assess due to pain provoked w/ other testing, SI Compression/distraction test: Negative; pain/pressure where therapists hands were, repeated x2 w/  same results despite varied hand positioning, FABER test: positive; pinpoint left SIJ pain, and Gaenslen's test: negative; general low back pain, position limited by body habitus  FUNCTIONAL TESTS:  5 times sit to stand: To be assessed.  GAIT: Distance walked: various clinic distances Assistive device utilized: None Level of assistance: Complete Independence Comments: Antalgic, left hip hike secondary to boot on left foot.  This boot may be removed at appt 8/20.  TODAY'S TREATMENT:  DATE: N/A - eval only.    PATIENT EDUCATION:  Education details: PT POC, assessment interpretations, assessments to be completed, and goals to be set. Person educated: Patient and Spouse Education method: Explanation Education comprehension: verbalized understanding  HOME EXERCISE PROGRAM: To be established.  ASSESSMENT:  CLINICAL IMPRESSION: Patient is a 52 y.o. female who was seen today for physical therapy evaluation and treatment for sacroiliac joint dysfunction and low back pain.  Pt has a significant PMH of DM2, HTN, L pontine and basal ganglia CVA, vertigo, and NSTEMI.  Identified impairments include left low back and hip pain, reported N/T down the front of the left lower leg, tenderness to palpation over the left PSIS and hip, BLE weakness and pain with AROM, and antalgic gait.  Evaluation via the following assessment tools: + FABER test indicates possible SIJ involvement.  Other testing was limited by pain provocation from prior assessments as well as body habitus.  Will assess 5xSTS at next session to further determine fall risk and functional weakness.  She would benefit from skilled PT to address impairments as noted and progress towards long term goals.  OBJECTIVE IMPAIRMENTS: Abnormal gait, decreased activity tolerance, decreased balance, decreased endurance, decreased  mobility, decreased strength, increased muscle spasms, impaired flexibility, impaired sensation, impaired vision/preception, improper body mechanics, postural dysfunction, obesity, and pain.   ACTIVITY LIMITATIONS: carrying, lifting, bending, standing, squatting, stairs, transfers, bathing, dressing, reach over head, hygiene/grooming, locomotion level, and caring for others  PARTICIPATION LIMITATIONS: meal prep, cleaning, laundry, driving, shopping, community activity, occupation, and yard work  PERSONAL FACTORS: Fitness, Past/current experiences, Sex, Transportation, and 1-2 comorbidities: HTN, DM2 (cannot have cortiosteroid injection for inflammation/pain relief)  are also affecting patient's functional outcome.   REHAB POTENTIAL: Fair see PMH and personal factors  CLINICAL DECISION MAKING: Evolving/moderate complexity  EVALUATION COMPLEXITY: Moderate   GOALS: Goals reviewed with patient? Yes  SHORT TERM GOALS: Target date: 10/03/2022  Pt will be independent and compliant with introductory strength and stretching HEP in order to maintain functional progress and improve mobility. Baseline:  To be established. Goal status: INITIAL  2.  Patient will report pain of less than or equal to 3/10 at rest in order to demonstrate improved pain management. Baseline: 5/10 Goal status: INITIAL  3.  5xSTS to be assessed w/ STG/LTG set as appropriate. Baseline: To be assessed. Goal status: INITIAL  LONG TERM GOALS: Target date: 10/31/2022  Pt will be independent and compliant with advanced strength and stretching HEP in order to maintain functional progress and improve mobility. Baseline:  To be established. Goal status: INITIAL  2.  5xSTS to be assessed w/ STG/LTG set as appropriate. Baseline: To be assessed. Goal status: INITIAL  3.  Patient will ambulate >/=250 feet without increased pain in order to improve functional mobility and ease of household ambulation. Baseline: all activities  increased pain 8/19 Goal status: INITIAL  4.  Patient will improve modified Oswestry score to </= 17/50 in order to demonstrate improved subjective functioning and quality of life. Baseline: 26/50 Goal status: INITIAL  PLAN:  PT FREQUENCY: 1x/week  PT DURATION: 8 weeks  PLANNED INTERVENTIONS: Therapeutic exercises, Therapeutic activity, Neuromuscular re-education, Balance training, Gait training, Patient/Family education, Self Care, Joint mobilization, Stair training, Vestibular training, DME instructions, Aquatic Therapy, Dry Needling, Electrical stimulation, Spinal mobilization, Moist heat, Taping, Manual therapy, and Re-evaluation.  PLAN FOR NEXT SESSION: Assess 5xSTS - set goals.  Initiate functional strength and stretching HEP.  Taping for SIJ stability?  TENS?   Sadie Haber,  PT, DPT 09/01/2022, 6:57 PM

## 2022-09-02 ENCOUNTER — Ambulatory Visit (INDEPENDENT_AMBULATORY_CARE_PROVIDER_SITE_OTHER): Payer: Federal, State, Local not specified - PPO | Admitting: Podiatry

## 2022-09-02 DIAGNOSIS — M722 Plantar fascial fibromatosis: Secondary | ICD-10-CM | POA: Diagnosis not present

## 2022-09-02 DIAGNOSIS — R600 Localized edema: Secondary | ICD-10-CM | POA: Diagnosis not present

## 2022-09-02 DIAGNOSIS — S93402A Sprain of unspecified ligament of left ankle, initial encounter: Secondary | ICD-10-CM

## 2022-09-02 NOTE — Progress Notes (Signed)
HPI: 52 y.o. female presents today for follow-up of left ankle sprain.  She has been wearing her cam walker for the past 3 weeks.  She does not sleep with it and takes it off for bathing also.  Now she notes that she is not having any pain and feels much better at this time.  Past Medical History:  Diagnosis Date   Allergy    Anemia    DURING MENSES--HAS HEAVY BLEEDING WITH PERODS   Angioedema 08/13/2020   Anxiety    Back pain, chronic    "ongoing"   Blood transfusion    IN 2012  AFTER C -SECTION   Cerebral thrombosis with cerebral infarction (HCC) 06/2009   RIGHT SIDED WEAKNESS ( ARM AND LEG ) AND SPASMS-remains with slight weakness and vertigo.   Constipation    Depression    Diabetes mellitus    Diabetic neuropathy (HCC)    BOTH FEET --COMES AND GOES   Edema, lower extremity    Endometriosis    Fatty liver    GERD (gastroesophageal reflux disease)    with pregnancy   H/O eye surgery    Headache(784.0)    MIGRAINES--NOT REALLY HEADACHE-MORE LIKE PRESSURE SENSATION IN HEAD-FEELS DIZZIY AND  FAINT AS THE PRESSURE RESOLVES   Hernia, incisional, RLQ, s/p lap repair Sep 2013 09/02/2011   History of vertigo 03/22/2018   Hx of migraines 10/19/2011   Hyperlipidemia    Hypertension    Leg pain, right    "like bad Charley horse"   Multiple food allergies    Panic disorder without agoraphobia    Rash    HANDS, ARMS --STATES HX OF RASH EVER SINCE CHILDBIRTH/PREGNANCY.  STATES THE RASH OFTEN OCCURS WHEN SHE IS REALLY STRESSED."goes and comes-presently left ring finger"   Restless leg syndrome    DIAGNOSED BY SLEEP STUDY - PT TOLD SHE DID NOT HAVE SLEEP APNEA   Right rotator cuff tear    PAIN IN RIGHT SHOULDER   SBO (small bowel obstruction) (HCC) 06/03/2012   Shortness of breath    Spastic hemiplegia affecting dominant side (HCC)    Stomach problems    Stroke (HCC)    Ventral hernia    RIGHT LOWER QUADRANT-CAUSING SOME PAIN   Weakness of right side of body     Past  Surgical History:  Procedure Laterality Date   APPLICATION OF WOUND VAC N/A 05/25/2015   Procedure: APPLICATION OF WOUND VAC;  Surgeon: Karie Soda, MD;  Location: [facility];  Service: General;  Laterality: N/A;   CARDIAC CATHETERIZATION     CESAREAN SECTION  2012   COLONOSCOPY     CORONARY STENT INTERVENTION N/A 09/05/2020   Procedure: CORONARY STENT INTERVENTION;  Surgeon: Elder Negus, MD;  [facility]: [facility];  Service: Cardiovascular;  Laterality: N/A;   CORONARY ULTRASOUND/IVUS N/A 09/05/2020   Procedure: Intravascular Ultrasound/IVUS;  Surgeon: Elder Negus, MD;  Location: [facility];  Service: Cardiovascular;  Laterality: N/A;   DIAGNOSTIC LAPAROSCOPY     ESOPHAGOGASTRODUODENOSCOPY N/A 06/03/2012   Procedure: ESOPHAGOGASTRODUODENOSCOPY (EGD);  Surgeon: Charna Elizabeth, MD;  Location: [facility];  Service: Endoscopy;  Laterality: N/A;   EXCISION MASS ABDOMINAL N/A 05/25/2015   Procedure: ABDOMINAL WALL EXPLORATION EXCISION OF SEROMA REMOVAL OF REDUNDANT SKIN ;  Surgeon: Karie Soda, MD;  Location: [facility];  Service: General;  Laterality: N/A;   EYE SURGERY     Eye laser for vessel hemorrhaging   fybroid removal     HERNIA REPAIR  10/03/2011   ventral hernia repair   INSERTION OF MESH N/A 02/04/2013   Procedure: INSERTION OF MESH;  Surgeon: Ardeth Sportsman, MD;  Location: [facility];  Service: General;  Laterality: N/A;   LEFT HEART CATH AND CORONARY ANGIOGRAPHY N/A 09/05/2020   Procedure: LEFT HEART CATH AND CORONARY ANGIOGRAPHY;  Surgeon: Elder Negus, MD;  [facility]: [facility];  Service: Cardiovascular;  Laterality: N/A;   PARS PLANA VITRECTOMY Left 08/24/2022   Procedure: PARS PLANA VITRECTOMY 25 GAUGE FOR ENDOPHTHALMITIS; INJECTION OF ANTIBIOTIC;  Surgeon: Carmela Rima, MD;  Location: [facility] OR;  Service: Ophthalmology;  Laterality: Left;   UMBILICAL HERNIA REPAIR N/A 02/04/2013   Procedure: LAPAROSCOPIC ventral wall hernia repair  LAPAROSCOPIC LYSIS OF ADHESIONS laparoscopic exploration of abdomen ;  Surgeon: Ardeth Sportsman, MD;  Location: [facility];  Service: General;  Laterality: N/A;   UPPER GASTROINTESTINAL ENDOSCOPY     URETER REVISION     Bilateral "twisted"   UTERINE FIBROID SURGERY     2 SURGERIES FOR FIBROIDS   VENTRAL HERNIA REPAIR  10/03/2011   Procedure: LAPAROSCOPIC VENTRAL HERNIA;  Surgeon: Ardeth Sportsman, MD;  Location: [facility];  Service: General;  Laterality: N/A;    Allergies  Allergen Reactions   Amlodipine Benzoate Swelling and Other (See Comments)    Leg swelling Other reaction(s): leg swelling Other reaction(s): ? leg swelling at 10 mg dose Other reaction(s): ? leg swelling at 10 mg dose Other reaction(s): ? leg swelling at 10 mg dose   Contrast Media [Iodinated Contrast Media] Shortness Of Breath and Other (See Comments)    Difficulty breathing   Iodine Anaphylaxis    Other reaction(s): Unknown Other reaction(s): Unknown Other reaction(s): Unknown Other reaction(s): Unknown   Iohexol Hives, Nausea And Vomiting, Swelling and Other (See Comments)     Desc: Magnevist-gadolinium-difficulty breathing, throat swelling    Midazolam Hcl Anaphylaxis    Difficulty breathing   Other Shortness Of Breath, Itching and Other (See Comments)    Patient is allergic to all nuts except peanuts, which are NOT "nuts"- they are legumes   Shellfish Allergy Anaphylaxis   Shellfish-Derived Products Anaphylaxis and Other (See Comments)    Other reaction(s): Unknown   Valsartan Swelling    Other reaction(s): Unknown Other reaction(s): Unknown Other reaction(s): angioedema Other reaction(s): angioedema   Metformin And Related Diarrhea, Nausea And Vomiting and Other (See Comments)    Dehydration, also   [place] [Dapagliflozin] Nausea Only and Other (See Comments)    UTI, headaches and muscle aches, also   Hydralazine Other (See Comments)    Reaction not recalled Other reaction(s): Unknown Other  reaction(s): Unknown Other reaction(s): Unknown Other reaction(s): Unknown   Saxagliptin Hives, Nausea And Vomiting and Other (See Comments)    ONGLYZA- dehydration, also Other reaction(s): Unknown Other reaction(s): Unknown Other reaction(s): Unknown Other reaction(s): Unknown   Spironolactone Other (See Comments)    Elevated potassium- Severe hyperkalemia Other reaction(s): severe hyperkalemia Other reaction(s): severe hyperkalemia Other reaction(s): severe hyperkalemia   Avandia [Rosiglitazone Maleate] Hives and Other (See Comments)   Geodon [Ziprasidone] Other (See Comments)    Reaction not recalled   Kiwi Extract Itching and Swelling   Latex Itching    Other reaction(s): Unknown Other reaction(s): Unknown     Physical Exam: General: The patient is alert and oriented x3 in no acute distress.  Vascular: Palpable pedal pulses bilaterally. Capillary refill within normal limits.   No erythema or calor.  Mild localized edema left ankle.  No ecchymosis  Musculoskeletal Exam: No pain on palpation of the medial ankle ligaments on the left.  No pain on palpation of the lateral ankle ligaments.  No pain on palpation of the Achilles tendon or posterior heel.  No pain across the anterior ankle joint.  No pain with range of motion of the ankle joint or subtalar joint.  No crepitus noted either.  Resisted eversion produces slight discomfort.  No pain on palpation near the fifth metatarsal base.  Mild pain on palpation plantar aspect of the left heel  Assessment/Plan of Care: 1. Mild ankle sprain, left, initial encounter   2. Localized edema   3. Plantar fascial fibromatosis     AMB REFERRAL TO PHYSICAL THERAPY  Discussed clinical findings with patient today.  Patient will slowly wean out of the cam walker back into a supportive sneaker.  She was instructed on how to do this over the next 2 weeks.  I feel that it would be best for her to improve with some physical therapy as well to help  strengthen some of the muscles in the area.  They can also address the plantar heel pain/plantar fasciitis that has developed since she has been wearing the cam walker.  Follow-up as needed    Clerance Lav, DPM, FACFAS [facility]     [address] [place], [place] [address]                Office [phone]  Fax [phone]

## 2022-09-03 DIAGNOSIS — E1165 Type 2 diabetes mellitus with hyperglycemia: Secondary | ICD-10-CM | POA: Diagnosis not present

## 2022-09-03 DIAGNOSIS — E1169 Type 2 diabetes mellitus with other specified complication: Secondary | ICD-10-CM | POA: Diagnosis not present

## 2022-09-03 DIAGNOSIS — E1122 Type 2 diabetes mellitus with diabetic chronic kidney disease: Secondary | ICD-10-CM | POA: Diagnosis not present

## 2022-09-10 DIAGNOSIS — M25641 Stiffness of right hand, not elsewhere classified: Secondary | ICD-10-CM | POA: Diagnosis not present

## 2022-09-10 DIAGNOSIS — G5601 Carpal tunnel syndrome, right upper limb: Secondary | ICD-10-CM | POA: Diagnosis not present

## 2022-09-10 DIAGNOSIS — M65342 Trigger finger, left ring finger: Secondary | ICD-10-CM | POA: Diagnosis not present

## 2022-09-10 DIAGNOSIS — M20011 Mallet finger of right finger(s): Secondary | ICD-10-CM | POA: Diagnosis not present

## 2022-09-10 DIAGNOSIS — M25541 Pain in joints of right hand: Secondary | ICD-10-CM | POA: Diagnosis not present

## 2022-09-11 ENCOUNTER — Encounter: Payer: Self-pay | Admitting: Physical Medicine & Rehabilitation

## 2022-09-11 ENCOUNTER — Encounter
Payer: Federal, State, Local not specified - PPO | Attending: Physical Medicine & Rehabilitation | Admitting: Physical Medicine & Rehabilitation

## 2022-09-11 ENCOUNTER — Ambulatory Visit: Payer: Federal, State, Local not specified - PPO | Admitting: Physical Therapy

## 2022-09-11 ENCOUNTER — Encounter: Payer: Self-pay | Admitting: Physical Therapy

## 2022-09-11 VITALS — BP 125/76 | HR 72 | Ht 66.0 in

## 2022-09-11 DIAGNOSIS — M62838 Other muscle spasm: Secondary | ICD-10-CM

## 2022-09-11 DIAGNOSIS — M7989 Other specified soft tissue disorders: Secondary | ICD-10-CM | POA: Diagnosis not present

## 2022-09-11 DIAGNOSIS — M533 Sacrococcygeal disorders, not elsewhere classified: Secondary | ICD-10-CM | POA: Diagnosis not present

## 2022-09-11 DIAGNOSIS — M5459 Other low back pain: Secondary | ICD-10-CM

## 2022-09-11 DIAGNOSIS — M6281 Muscle weakness (generalized): Secondary | ICD-10-CM

## 2022-09-11 DIAGNOSIS — R262 Difficulty in walking, not elsewhere classified: Secondary | ICD-10-CM | POA: Diagnosis not present

## 2022-09-11 DIAGNOSIS — M25572 Pain in left ankle and joints of left foot: Secondary | ICD-10-CM

## 2022-09-11 DIAGNOSIS — M25552 Pain in left hip: Secondary | ICD-10-CM | POA: Diagnosis not present

## 2022-09-11 DIAGNOSIS — M25551 Pain in right hip: Secondary | ICD-10-CM

## 2022-09-11 MED ORDER — TRAMADOL HCL ER 100 MG PO TB24: 100.0000 mg | ORAL_TABLET | Freq: Every day | ORAL | 1 refills | Status: DC

## 2022-09-11 NOTE — Patient Instructions (Addendum)
Please take extended release tramadol at night only   Hip Bursitis  Hip bursitis is swelling of one or more fluid-filled sacs (bursae) in your hip joint. If the bursa becomes irritated, it can fill with extra fluid and become swollen. This condition can cause pain, and your symptoms may come and go over time. What are the causes? Repeated use of your hip muscles. Injury to the hip. Weak butt muscles. Bone spurs. Infection. In some cases, the cause may not be known. What increases the risk? Having a past hip injury or hip surgery. Having a condition, such as arthritis, gout, diabetes, or thyroid disease. Having spine problems. Having one leg that is shorter than the other. Running a lot or doing long-distance running. Playing sports where there is a risk of injury or falling, such as football, martial arts, or skiing. What are the signs or symptoms? Symptoms may come and go, and they often include: Pain in the hip or groin area. Pain may get worse when you move your hip. Tenderness and swelling of the hip. In rare cases, the bursa may become infected. If this happens, you may: Get a fever. Have warmth and redness in the hip area. How is this treated? This condition is treated by: Resting your hip. Icing your hip. Wrapping the hip area with an elastic bandage (compression wrap). Keeping the hip raised. Other treatments may include: Using crutches, a cane, or a walker. Medicines. Draining fluid out of the bursa. Surgery to take out a bursa. This is rare. Long-term treatment may include: Doing exercises to help your strength and flexibility. Lifestyle changes like losing weight to lessen the strain on your hip. Follow these instructions at home: Managing pain, stiffness, and swelling     If told, put ice on the painful area. To do this: Put ice in a plastic bag. Place a towel between your skin and the bag. Leave the ice on for 20 minutes, 2-3 times a day. Take off the ice  if your skin turns bright red. This is very important. If you cannot feel pain, heat, or cold, you have a greater risk of damage to the area. Raise your hip by putting a pillow under your hips while you lie down. Stop if you feel pain. If told, put heat on the affected area. Do this as often as told by your doctor. Use the heat source that your doctor recommends, such as a moist heat pack or a heating pad. Place a towel between your skin and the heat source. Leave the heat on for 20-30 minutes. Take off the heat if your skin turns bright red. This is very important. If you cannot feel pain, heat, or cold, you have a greater risk of getting burned. Activity Do not use your hip to support your body weight until your doctor says that you can. Use crutches, a cane, or a walker as told by your doctor. If the affected leg is one that you use to drive, ask your doctor if it is safe to drive. Rest and protect your hip as much as you can until you feel better. Return to your normal activities when your doctor says that it is safe. Do exercises as told by your doctor. General instructions Take over-the-counter and prescription medicines only as told by your doctor. Gently rub and stretch your injured area as often as is comfortable. Wear elastic bandages only as told by your doctor. If one of your legs is shorter than the other, get  fitted for a shoe insert or orthotic. Keep a healthy weight. Follow instructions from your doctor. Keep all follow-up visits. How is this prevented? Exercise regularly or as told by your doctor. Wear the right shoes for the sport you play and for daily activities. Warm up and stretch before being active. Cool down and stretch after being active. Take breaks often from repeated activity. Avoid activities that bother your hip or cause pain. Avoid sitting down for a long time. Where to find more information American Academy of Orthopaedic Surgeons:  orthoinfo.aaos.org Contact a doctor if: You have a fever. You have new symptoms. You have trouble walking or doing everyday activities. You have pain that gets worse or does not get better with medicine. The skin around your hip is red. You get a feeling of warmth in your hip area. Get help right away if: You cannot move your hip. You have very bad pain. You cannot control the muscles in your feet. Summary Hip bursitis is swelling of one or more fluid-filled sacs (bursae) in your hip joint. Symptoms often come and go over time. This condition is often treated by resting and icing the hip. It also may help to keep the area raised and wrapped in an elastic bandage. Other treatments may be needed. This information is not intended to replace advice given to you by your health care provider. Make sure you discuss any questions you have with your health care provider. Document Revised: 12/25/2020 Document Reviewed: 12/25/2020 Elsevier Patient Education  2024 [employer].

## 2022-09-11 NOTE — Patient Instructions (Signed)
Access Code: [email or web address] URL: [email or web address] Date: 09/11/2022 Prepared by: Camille Bal  Exercises - Supine Bridge  - 1 x daily - 4 x weekly - 2 sets - 10 reps - Supine Lower Trunk Rotation  - 1 x daily - 5 x weekly - 2-3 sets - 10 reps - Supine Single Leg Ankle Pumps  - 1 x daily - 5 x weekly - 3 sets - 10 reps - Seated Ankle Alphabet  - 1 x daily - 4-5 x weekly - 1-2 sets

## 2022-09-11 NOTE — Progress Notes (Signed)
Subjective:    Patient ID: Tina [name], female    DOB: [birth year], 52 y.o.   MRN: [id number]  HPI  52 yo female with hx of bilateral pontine infarcts and residual mild Right hemiparesis who presents with primary complain of Low back and hip pain .  Pt had a fall 07/21/22 with ED visit resulting in hand and knee pain , xrays did not show any osseous injury .  Pt later felt that back and hip pain increased as well  Pain Inventory Average Pain 10 Pain Right Now 10 My pain is burning, stabbing, and aching  LOCATION OF PAIN  fingers back buttocks thiign  BOWEL Number of stools per week: 3 Oral laxative use No  Type of laxative na Enema or suppository use No  History of colostomy No  Incontinent No   BLADDER Normal In and out cath, frequency na Able to self cath No  Bladder incontinence No  Frequent urination No  Leakage with coughing No  Difficulty starting stream No  Incomplete bladder emptying No    Mobility walk without assistance how many minutes can you walk? 10 ability to climb steps?  yes do you drive?  yes Do you have any goals in this area?  yes  Function not employed: date last employed   Needs assistance with dressing bathing meal prep household duties shopping Neuro/Psych spasms depression  Prior Studies na  Physicians involved in your care Follow up   Family History  Problem Relation Age of Onset   Diabetes Father    Kidney disease Father    Depression Father    Drug abuse Father    Allergic rhinitis Mother    Eczema Mother    Urticaria Mother    Depression Mother    Anxiety disorder Mother    Bipolar disorder Mother    Alcoholism Mother    Drug abuse Mother    Eating disorder Mother    Diabetes Maternal Grandmother    Hyperlipidemia Paternal Grandmother    Stroke Paternal Grandmother    Eczema Sister    Urticaria Sister    Colon cancer Paternal Uncle    Other Neg Hx    Angioedema Neg Hx    Asthma Neg Hx    Colon polyps Neg  Hx    Esophageal cancer Neg Hx    Rectal cancer Neg Hx    Stomach cancer Neg Hx    Social History   Socioeconomic History   Marital status: Married    Spouse name: Jeannett Senior   Number of children: 3   Years of education: BA degree   Highest education level: Not on file  Occupational History   Occupation: stay at home mom    Employer: UNEMPLOYED  Tobacco Use   Smoking status: Never   Smokeless tobacco: Never  Vaping Use   Vaping status: Never Used  Substance and Sexual Activity   Alcohol use: No    Alcohol/week: 0.0 standard drinks of alcohol   Drug use: Not Currently   Sexual activity: Yes    Birth control/protection: None  Other Topics Concern   Not on file  Social History Narrative   Patient is married with 2 children.   Patient is right handed.   Patient has her  BA degree.   Patient drinks 1 cup daily.   Social Determinants of Health   Financial Resource Strain: Not on file  Food Insecurity: No Food Insecurity (07/27/2019)   Hunger Vital Sign    Worried About  Running Out of Food in the Last Year: Never true    Ran Out of Food in the Last Year: Never true  Transportation Needs: No Transportation Needs (07/27/2019)   PRAPARE - Administrator, Civil Service (Medical): No    Lack of Transportation (Non-Medical): No  Physical Activity: Not on file  Stress: Not on file  Social Connections: Not on file   Past Surgical History:  Procedure Laterality Date   APPLICATION OF WOUND VAC N/A 05/25/2015   Procedure: APPLICATION OF WOUND VAC;  Surgeon: Karie Soda, MD;  Location: [facility];  Service: General;  Laterality: N/A;   CARDIAC CATHETERIZATION     CESAREAN SECTION  2012   COLONOSCOPY     CORONARY STENT INTERVENTION N/A 09/05/2020   Procedure: CORONARY STENT INTERVENTION;  Surgeon: Elder Negus, MD;  [facility]: [facility];  Service: Cardiovascular;  Laterality: N/A;   CORONARY ULTRASOUND/IVUS N/A 09/05/2020   Procedure: Intravascular  Ultrasound/IVUS;  Surgeon: Elder Negus, MD;  [facility];  Service: Cardiovascular;  Laterality: N/A;   DIAGNOSTIC LAPAROSCOPY     ESOPHAGOGASTRODUODENOSCOPY N/A 06/03/2012   Procedure: ESOPHAGOGASTRODUODENOSCOPY (EGD);  Surgeon: Charna Elizabeth, MD;  Location: [facility] ENDOSCOPY;  Service: Endoscopy;  Laterality: N/A;   EXCISION MASS ABDOMINAL N/A 05/25/2015   Procedure: ABDOMINAL WALL EXPLORATION EXCISION OF SEROMA REMOVAL OF REDUNDANT SKIN ;  Surgeon: Karie Soda, MD;  Location: [facility];  Service: General;  Laterality: N/A;   EYE SURGERY     Eye laser for vessel hemorrhaging   fybroid removal     HERNIA REPAIR  10/03/2011   ventral hernia repair   INSERTION OF MESH N/A 02/04/2013   Procedure: INSERTION OF MESH;  Surgeon: Ardeth Sportsman, MD;  Location: [facility];  Service: General;  Laterality: N/A;   LEFT HEART CATH AND CORONARY ANGIOGRAPHY N/A 09/05/2020   Procedure: LEFT HEART CATH AND CORONARY ANGIOGRAPHY;  Surgeon: Elder Negus, MD;  [facility];  Service: Cardiovascular;  Laterality: N/A;   PARS PLANA VITRECTOMY Left 08/24/2022   Procedure: PARS PLANA VITRECTOMY 25 GAUGE FOR ENDOPHTHALMITIS; INJECTION OF ANTIBIOTIC;  Surgeon: Carmela Rima, MD;  Location: [facility] OR;  Service: Ophthalmology;  Laterality: Left;   UMBILICAL HERNIA REPAIR N/A 02/04/2013   Procedure: LAPAROSCOPIC ventral wall hernia repair LAPAROSCOPIC LYSIS OF ADHESIONS laparoscopic exploration of abdomen ;  Surgeon: Ardeth Sportsman, MD;  Location: [facility];  Service: General;  Laterality: N/A;   UPPER GASTROINTESTINAL ENDOSCOPY     URETER REVISION     Bilateral "twisted"   UTERINE FIBROID SURGERY     2 SURGERIES FOR FIBROIDS   VENTRAL HERNIA REPAIR  10/03/2011   Procedure: LAPAROSCOPIC VENTRAL HERNIA;  Surgeon: Ardeth Sportsman, MD;  Location: [facility];  Service: General;  Laterality: N/A;   Past Medical History:  Diagnosis Date   Allergy    Anemia    DURING MENSES--HAS HEAVY  BLEEDING WITH PERODS   Angioedema 08/13/2020   Anxiety    Back pain, chronic    "ongoing"   Blood transfusion    IN 2012  AFTER C -SECTION   Cerebral thrombosis with cerebral infarction (HCC) 06/2009   RIGHT SIDED WEAKNESS ( ARM AND LEG ) AND SPASMS-remains with slight weakness and vertigo.   Constipation    Depression    Diabetes mellitus    Diabetic neuropathy (HCC)    BOTH FEET --COMES AND GOES   Edema, lower extremity    Endometriosis  Fatty liver    GERD (gastroesophageal reflux disease)    with pregnancy   H/O eye surgery    Headache(784.0)    MIGRAINES--NOT REALLY HEADACHE-MORE LIKE PRESSURE SENSATION IN HEAD-FEELS DIZZIY AND  FAINT AS THE PRESSURE RESOLVES   Hernia, incisional, RLQ, s/p lap repair Sep 2013 09/02/2011   History of vertigo 03/22/2018   Hx of migraines 10/19/2011   Hyperlipidemia    Hypertension    Leg pain, right    "like bad Charley horse"   Multiple food allergies    Panic disorder without agoraphobia    Rash    HANDS, ARMS --STATES HX OF RASH EVER SINCE CHILDBIRTH/PREGNANCY.  STATES THE RASH OFTEN OCCURS WHEN SHE IS REALLY STRESSED."goes and comes-presently left ring finger"   Restless leg syndrome    DIAGNOSED BY SLEEP STUDY - PT TOLD SHE DID NOT HAVE SLEEP APNEA   Right rotator cuff tear    PAIN IN RIGHT SHOULDER   SBO (small bowel obstruction) (HCC) 06/03/2012   Shortness of breath    Spastic hemiplegia affecting dominant side (HCC)    Stomach problems    Stroke (HCC)    Ventral hernia    RIGHT LOWER QUADRANT-CAUSING SOME PAIN   Weakness of right side of body    BP 125/76   Pulse 72   Ht 5\' 6"  (1.676 m)   LMP  (LMP Unknown)   SpO2 97%   BMI 40.32 kg/m   Opioid Risk Score:   Fall Risk Score:  `1  Depression screen PHQ 2/9     08/12/2022    2:54 PM 09/03/2021    1:23 PM 07/11/2021   11:20 AM 05/30/2021   10:56 AM 03/26/2021   10:21 AM 01/02/2021    4:30 PM 12/25/2020    2:14 PM  Depression screen PHQ 2/9  Decreased Interest  0 1 0 1 1 0 1  Down, Depressed, Hopeless 0 1 0 1 1 0 1  PHQ - 2 Score 0 2 0 2 2 0 2      Review of Systems  Musculoskeletal:  Positive for back pain.       Buttocks fingers thigh pain  All other systems reviewed and are negative.      Objective:   Physical Exam Vitals and nursing note reviewed.  Constitutional:      Appearance: She is obese.  Eyes:     Extraocular Movements: Extraocular movements intact.     Conjunctiva/sclera: Conjunctivae normal.     Pupils: Pupils are equal, round, and reactive to light.  Neurological:     Mental Status: She is alert and oriented to person, place, and time.  Psychiatric:        Mood and Affect: Mood normal.        Behavior: Behavior normal.    RIght UE and LE 4/5 left side 5/5  Sensation equal and normal bilateral L2,3,4,5, S1 dermatomes    Sacral thrust (prone) :+ Bilateral  Lateral compression:  + lateral troch pain not SI FABER's: + L>R PSIS pain Distraction (supine): neg Thigh thrust test: + L>R SI pain  Amb without assistive device , no evidence of toe drag or knee instability      Assessment & Plan:   Acute on chronic low back and hip pain after a fall , pt was seen in ED 07/21/22 but no imaging of pelvis area after the fall, will order.  Pt is starting in PT today  Clinically pt seems to have mainly L>R sacroiliac  pain and Right troch bursitis  Not sleeping through night add tramadol ER 100mg  at bedtime , cont tramadol 50mg  BID or TID RTC 6wks

## 2022-09-11 NOTE — Therapy (Signed)
OUTPATIENT PHYSICAL THERAPY THORACOLUMBAR TREATMENT   Patient Name: Tina [name] MRN: [id number] DOB:[birth year], 52 y.o., female Today's Date: 09/12/2022  END OF SESSION:  PT End of Session - 09/11/22 1628     Visit Number 2    Number of Visits 9   8 + eval   Date for PT Re-Evaluation 11/07/22   pushed out due to scheduling preferences   Authorization Type BLUE CROSS BLUE SHIELD    PT Start Time 1620    PT Stop Time 1659    PT Time Calculation (min) 39 min    Activity Tolerance Patient limited by pain;Patient tolerated treatment well    Behavior During Therapy [facility] for tasks assessed/performed             Past Medical History:  Diagnosis Date   Allergy    Anemia    DURING MENSES--HAS HEAVY BLEEDING WITH PERODS   Angioedema 08/13/2020   Anxiety    Back pain, chronic    "ongoing"   Blood transfusion    IN 2012  AFTER C -SECTION   Cerebral thrombosis with cerebral infarction (HCC) 06/2009   RIGHT SIDED WEAKNESS ( ARM AND LEG ) AND SPASMS-remains with slight weakness and vertigo.   Constipation    Depression    Diabetes mellitus    Diabetic neuropathy (HCC)    BOTH FEET --COMES AND GOES   Edema, lower extremity    Endometriosis    Fatty liver    GERD (gastroesophageal reflux disease)    with pregnancy   H/O eye surgery    Headache(784.0)    MIGRAINES--NOT REALLY HEADACHE-MORE LIKE PRESSURE SENSATION IN HEAD-FEELS DIZZIY AND  FAINT AS THE PRESSURE RESOLVES   Hernia, incisional, RLQ, s/p lap repair Sep 2013 09/02/2011   History of vertigo 03/22/2018   Hx of migraines 10/19/2011   Hyperlipidemia    Hypertension    Leg pain, right    "like bad Charley horse"   Multiple food allergies    Panic disorder without agoraphobia    Rash    HANDS, ARMS --STATES HX OF RASH EVER SINCE CHILDBIRTH/PREGNANCY.  STATES THE RASH OFTEN OCCURS WHEN SHE IS REALLY STRESSED."goes and comes-presently left ring finger"   Restless leg syndrome    DIAGNOSED BY SLEEP STUDY - PT TOLD  SHE DID NOT HAVE SLEEP APNEA   Right rotator cuff tear    PAIN IN RIGHT SHOULDER   SBO (small bowel obstruction) (HCC) 06/03/2012   Shortness of breath    Spastic hemiplegia affecting dominant side (HCC)    Stomach problems    Stroke (HCC)    Ventral hernia    RIGHT LOWER QUADRANT-CAUSING SOME PAIN   Weakness of right side of body    Past Surgical History:  Procedure Laterality Date   APPLICATION OF WOUND VAC N/A 05/25/2015   Procedure: APPLICATION OF WOUND VAC;  Surgeon: Karie Soda, MD;  Location: [facility];  Service: General;  Laterality: N/A;   CARDIAC CATHETERIZATION     CESAREAN SECTION  2012   COLONOSCOPY     CORONARY STENT INTERVENTION N/A 09/05/2020   Procedure: CORONARY STENT INTERVENTION;  Surgeon: Elder Negus, MD;  Location: [facility];  Service: Cardiovascular;  Laterality: N/A;   CORONARY ULTRASOUND/IVUS N/A 09/05/2020   Procedure: Intravascular Ultrasound/IVUS;  Surgeon: Elder Negus, MD;  Location: [facility];  Service: Cardiovascular;  Laterality: N/A;   DIAGNOSTIC LAPAROSCOPY     ESOPHAGOGASTRODUODENOSCOPY N/A 06/03/2012   Procedure: ESOPHAGOGASTRODUODENOSCOPY (EGD);  Surgeon:  Charna Elizabeth, MD;  Location: [facility] ENDOSCOPY;  Service: Endoscopy;  Laterality: N/A;   EXCISION MASS ABDOMINAL N/A 05/25/2015   Procedure: ABDOMINAL WALL EXPLORATION EXCISION OF SEROMA REMOVAL OF REDUNDANT SKIN ;  Surgeon: Karie Soda, MD;  Location: [facility];  Service: General;  Laterality: N/A;   EYE SURGERY     Eye laser for vessel hemorrhaging   fybroid removal     HERNIA REPAIR  10/03/2011   ventral hernia repair   INSERTION OF MESH N/A 02/04/2013   Procedure: INSERTION OF MESH;  Surgeon: Ardeth Sportsman, MD;  Location: [facility];  Service: General;  Laterality: N/A;   LEFT HEART CATH AND CORONARY ANGIOGRAPHY N/A 09/05/2020   Procedure: LEFT HEART CATH AND CORONARY ANGIOGRAPHY;  Surgeon: Elder Negus, MD;  Location: [facility];  Service:  Cardiovascular;  Laterality: N/A;   PARS PLANA VITRECTOMY Left 08/24/2022   Procedure: PARS PLANA VITRECTOMY 25 GAUGE FOR ENDOPHTHALMITIS; INJECTION OF ANTIBIOTIC;  Surgeon: Carmela Rima, MD;  Location[facility] OR;  Service: Ophthalmology;  Laterality: Left;   UMBILICAL HERNIA REPAIR N/A 02/04/2013   Procedure: LAPAROSCOPIC ventral wall hernia repair LAPAROSCOPIC LYSIS OF ADHESIONS laparoscopic exploration of abdomen ;  Surgeon: Ardeth Sportsman, MD;  Location: [facility];  Service: General;  Laterality: N/A;   UPPER GASTROINTESTINAL ENDOSCOPY     URETER REVISION     Bilateral "twisted"   UTERINE FIBROID SURGERY     2 SURGERIES FOR FIBROIDS   VENTRAL HERNIA REPAIR  10/03/2011   Procedure: LAPAROSCOPIC VENTRAL HERNIA;  Surgeon: Ardeth Sportsman, MD;  Location: [facility];  Service: General;  Laterality: N/A;   Patient Active Problem List   Diagnosis Date Noted   Poor compliance with CPAP treatment 09/04/2021   Elevated lipase 09/04/2021   Belching 09/04/2021   Gastroesophageal reflux disease 09/04/2021   Hypertensive emergency 09/09/2020   Chest pain    Elevated troponin    Status post coronary artery stent placement    Non-ST elevation (NSTEMI) myocardial infarction (HCC) 09/05/2020   NSTEMI (non-ST elevated myocardial infarction) (HCC) 09/05/2020   Hypersomnia, persistent 09/04/2020   OSA on CPAP 09/04/2020   Shortness of breath 08/18/2020   Allergy to alpha-gal 08/18/2020   Seasonal and perennial allergic rhinitis 08/18/2020   Allergy with anaphylaxis due to food 08/18/2020   Pruritus 08/18/2020   Pollen-food allergy 08/18/2020   Allergic reaction 08/13/2020   Angioedema 08/13/2020   Type 2 macular telangiectasis, bilateral 01/03/2020   Excessive daytime sleepiness 09/12/2019   Sleep paralysis 09/12/2019   Hypnopompic hallucination 09/12/2019   Abnormal dreams 09/12/2019   Diabetic macular edema of right eye with proliferative retinopathy associated with type 2 diabetes mellitus (HCC)  07/04/2019   Stable treated proliferative diabetic retinopathy of left eye determined by examination associated with type 2 diabetes mellitus (HCC) 07/04/2019   Long-term insulin use (HCC) 04/07/2019   Depression 04/13/2018   History of vertigo 03/22/2018   Laboratory examination 03/22/2018   Chronic diastolic (congestive) heart failure (HCC) 03/22/2018   Other hyperlipidemia 02/15/2018   Vitamin D deficiency 12/31/2017   Class 2 severe obesity with serious comorbidity and body mass index (BMI) of 39.0 to 39.9 in adult (HCC) 11/18/2017   Gait disturbance, post-stroke 04/23/2017   Strain of gluteus medius 04/23/2017   OSA (obstructive sleep apnea) 10/17/2015   Periodic limb movement disorder (PLMD) 10/17/2015   Essential hypertension 05/26/2015   Diabetes mellitus (HCC) 05/26/2015   Abdominal wall mass of right lower quadrant 05/25/2015   Hemiparesis  affecting right side as late effect of stroke (HCC) 11/09/2014   Abdominal wall seroma    Sepsis due to undetermined organism (HCC) 03/06/2014   Nausea vomiting and diarrhea 03/06/2014   Sepsis (HCC) 03/06/2014   Tension headache 09/01/2013   Nausea 03/21/2013   Altered bowel habits 02/28/2013   Spastic hemiplegia affecting dominant side (HCC) 02/16/2013   Recurrent ventral incisional hernia s/p closure/repair w mesh 02/04/2013 01/26/2013   Constipation, chronic 01/26/2013   Abdominal pain, chronic, right lower quadrant 06/03/2012   History of CVA (cerebrovascular accident) 06/03/2012   HTN (hypertension) 06/03/2012   Diabetes mellitus type II, uncontrolled (HCC) 10/16/2011   Obesity (BMI 30-39.9) 09/02/2011   Rotator cuff tear 08/25/2011   Sciatica 06/10/2011   Paresthesias in right hand 06/10/2011   Lumbago 05/09/2011   Paresthesias 05/09/2011    PCP: Irena Reichmann, DO  REFERRING PROVIDER: Erick Colace, MD; Lucia Estelle D, DPM  REFERRING DIAG: M53.3 (ICD-10-CM) - Sacroiliac dysfunction [phone] (ICD-10-CM) - Mild  ankle sprain, left, initial encounter  R60.0 (ICD-10-CM) - Localized edema    Rationale for Evaluation and Treatment: Rehabilitation  THERAPY DIAG:  Other muscle spasm  Other specified soft tissue disorders  Other low back pain  Pain in left hip  Pain in right hip  Pain in left ankle and joints of left foot  Muscle weakness (generalized)  Difficulty in walking, not elsewhere classified  ONSET DATE: July 21, 2022 (had a fall and pain started after)  SUBJECTIVE:                                                                                                                                                                                           SUBJECTIVE STATEMENT: Patient presents with new pain this visit in right hip and left ankle also related to fall.  She has seen her podiatrist since initial evaluation and is out of CAM boot fully at present wearing closed slip-in tennis shoes.  She has severe pain today.  Blood glucose - 219 about 1pm  PERTINENT HISTORY:  DM2, HTN, L pontine and basal ganglia CVA, vertigo, NSTEMI  PAIN:  Are you having pain? Yes: NPRS scale: 9/10 Pain location: left side of back into glute region, right hip, left ankle, low back Pain description: burning, aching, sharp in ankle with movement Aggravating factors: walking, cleaning, standing too long, sitting too long Relieving factors: not much, sometimes repositioning or stretching  PRECAUTIONS: Fall and Other: glucose sensor on left arm (day 2 of 14 day sensor); some visual limitation  RED FLAGS: None   WEIGHT BEARING RESTRICTIONS: No  FALLS:  Has patient fallen in last  6 months? Yes. 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  PATIENT SURVEYS:  Modified Oswestry 26/50 = moderate perceived disability   SCREENING FOR RED FLAGS: Bowel or bladder incontinence: No Spinal tumors: No Cauda equina syndrome: No Compression fracture: No Abdominal aneurysm: No  COGNITION: Overall cognitive status: Within functional limits for tasks assessed     SENSATION: Light touch: WFL and pt reports pins/needles down entire front of left LE  POSTURE: rounded shoulders and forward head  PALPATION: Tender over left hip and left PSIS region, diffusely sore over low back region  LUMBAR ROM:   AROM eval  Flexion Patient off balance in CAM boot; assessment deferred  Extension   Right lateral flexion   Left lateral flexion   Right rotation   Left rotation    (Blank rows = not tested)  LOWER EXTREMITY ROM:     Active  Right eval Left eval  Hip flexion Grossly WFL; unable to assess left ankle due to boot  Hip extension   Hip abduction   Hip adduction   Hip internal rotation   Hip external rotation   Knee flexion   Knee extension   Ankle dorsiflexion   Ankle plantarflexion    Ankle inversion    Ankle eversion     (Blank rows = not tested)  LOWER EXTREMITY MMT:    MMT Right eval Left eval  Hip flexion 3+/5 3/5; pain  Hip extension    Hip abduction    Hip adduction    Hip internal rotation    Hip external rotation    Knee flexion    Knee extension 4/5 3+/5; pain  Ankle dorsiflexion 5/5   Ankle plantarflexion    Ankle inversion    Ankle eversion     (Blank rows = not tested)  LUMBAR SPECIAL TESTS:  Stork standing: unable to assess due to pain provoked  w/ other testing, SI Compression/distraction test: Negative; pain/pressure where therapists hands were, repeated x2 w/ same results despite varied hand positioning, FABER test: positive; pinpoint left SIJ pain, and Gaenslen's test: negative; general low back pain, position limited by body habitus  FUNCTIONAL TESTS:  5 times sit to stand: To be assessed.  GAIT: Distance walked: various clinic distances Assistive device utilized: None Level of assistance: Complete Independence Comments: Antalgic, left hip hike secondary to boot on left foot.  This boot may be removed at appt 8/20.  TODAY'S TREATMENT:  DATE: 09/11/2022  -Time spent discussing new concerns with patient regarding ankle, hip and back pain and goals to address this going forward.  Process of resending cert to cover these deficits.  -5xSTS:  31.03 sec w/ BUE support; pt required second attempt due to posterior LOB into chair on initial -Assessed LEFS due to recent change in pain and functioning for new cert:  16/10  Initiated HEP: -Supine bridges 2x10 -Supine lower trunk rotations x20 -Supine ankle pumps x30 LLE only -Seated ankle alphabet (capital letters) x1  PATIENT EDUCATION:  Education details: Outcome interpretations and goals to be set.  Initial HEP-answered questions related to reps/form, etc. Person educated: Patient and Spouse Education method: Explanation Education comprehension: verbalized understanding  HOME EXERCISE PROGRAM: Access Code: [email or web address] URL: [email or web address] Date: 09/11/2022 Prepared by: Camille Bal  Exercises - Supine Bridge  - 1 x daily - 4 x weekly - 2 sets - 10 reps - Supine Lower Trunk Rotation  - 1 x daily - 5 x weekly - 2-3 sets - 10 reps - Supine Single Leg Ankle Pumps  - 1 x daily - 5 x weekly - 3 sets - 10 reps - Seated Ankle Alphabet  -  1 x daily - 4-5 x weekly - 1-2 sets  ASSESSMENT:  CLINICAL IMPRESSION: Patient has had more ankle pain and has new referral for PT to address this so adding to current cert today.  Assessed LEFS today to track improvements in LE pain with patient's initial score of 17/80.  She completes the 5xSTS in 31.03 seconds today using BUE support.  Time spent initiating an HEP to address mobility deficits and weakness.  Will continue per POC.  OBJECTIVE IMPAIRMENTS: Abnormal gait, decreased activity tolerance, decreased balance, decreased endurance, decreased mobility, decreased strength, increased muscle spasms, impaired flexibility, impaired sensation, impaired vision/preception, improper body mechanics, postural dysfunction, obesity, and pain.   ACTIVITY LIMITATIONS: carrying, lifting, bending, standing, squatting, stairs, transfers, bathing, dressing, reach over head, hygiene/grooming, locomotion level, and caring for others  PARTICIPATION LIMITATIONS: meal prep, cleaning, laundry, driving, shopping, community activity, occupation, and yard work  PERSONAL FACTORS: Fitness, Past/current experiences, Sex, Transportation, and 1-2 comorbidities: HTN, DM2 (cannot have cortiosteroid injection for inflammation/pain relief)  are also affecting patient's functional outcome.   REHAB POTENTIAL: Fair see PMH and personal factors  CLINICAL DECISION MAKING: Evolving/moderate complexity  EVALUATION COMPLEXITY: Moderate   GOALS: Goals reviewed with patient? Yes  SHORT TERM GOALS: Target date: 10/03/2022  Pt will be independent and compliant with introductory strength and stretching HEP in order to maintain functional progress and improve mobility. Baseline:  To be established. Goal status: INITIAL  2.  Patient will report pain of less than or equal to 3/10 at rest in order to demonstrate improved pain management. Baseline: 5/10 Goal status: INITIAL  3.  Pt will decrease 5xSTS to </=21.03 seconds in order  to demonstrate decreased risk for falls and improved functional bilateral LE strength and power. Baseline: 31.03 sec w/ BUE support (8/29) Goal status: INITIAL  LONG TERM GOALS: Target date: 10/31/2022  Pt will be independent and compliant with advanced strength and stretching HEP in order to maintain functional progress and improve mobility. Baseline:  To be established. Goal status: INITIAL  2.  Pt will decrease 5xSTS to </=11.03 seconds in order to demonstrate decreased risk for falls and improved functional bilateral LE strength and power. Baseline: 31.03 sec w/ BUE support (8/29) Goal status: INITIAL  3.  Patient will ambulate >/=250 feet without increased pain in  order to improve functional mobility and ease of household ambulation. Baseline: all activities increased pain 8/19 Goal status: INITIAL  4.  Patient will improve modified Oswestry score to </= 17/50 in order to demonstrate improved subjective functioning and quality of life. Baseline: 26/50 Goal status: INITIAL  5.  Patient will improve LEFS score to >/=26 in order to demonstrate improved subjective functioning and quality of life. Baseline: 17/80 (8/29) Goal status: INITIAL PLAN:  PT FREQUENCY: 1x/week  PT DURATION: 8 weeks  PLANNED INTERVENTIONS: Therapeutic exercises, Therapeutic activity, Neuromuscular re-education, Balance training, Gait training, Patient/Family education, Self Care, Joint mobilization, Stair training, Vestibular training, DME instructions, Aquatic Therapy, Dry Needling, Electrical stimulation, Spinal mobilization, Moist heat, Taping, Manual therapy, and Re-evaluation.  PLAN FOR NEXT SESSION:  Add to functional strength and stretching HEP.  Taping for SIJ stability/bursitis?  TENS?  Ankle stability.  Hip strengthening.  STS.  Sadie Haber, PT, DPT 09/12/2022, 12:20 PM

## 2022-09-12 ENCOUNTER — Encounter: Payer: PPO | Admitting: *Deleted

## 2022-09-12 VITALS — BP 144/65 | HR 85 | Temp 98.0°F | Resp 18 | Ht 66.0 in | Wt 252.0 lb

## 2022-09-12 DIAGNOSIS — Z006 Encounter for examination for normal comparison and control in clinical research program: Secondary | ICD-10-CM

## 2022-09-12 NOTE — Research (Cosign Needed Addendum)
AA HEART  Informed Consent   Subject Name: Tina [name]  Subject met inclusion and exclusion criteria.  The informed consent form, study requirements and expectations were reviewed with the subject and questions and concerns were addressed prior to the signing of the consent form.  The subject verbalized understanding of the trial requirements.  The subject agreed to participate in the AA HEART  trial and signed the informed consent at 10:18 AM on 09/12/2022.  The informed consent was obtained prior to performance of any protocol-specific procedures for the subject.  A copy of the signed informed consent was given to the subject and a copy was placed in the subject's medical record.   [place] Azucena Dart, Research Coordinator   Are there any labs that are clinically significant?  Yes []  OR No[]   Please FORWARD back to me with any changes or follow up!    ACCESSION NO. [id number]                                             Page 1 of 1                                                        INVESTIGATOR: ([id number])                          PROTOCOL   [id number]                     Thomasene Ripple, M.D.                              INVESTIGATOR NO.: [id number]                     c/o [facility]                         SUBJECT NUMBER: [id number]                     [place]                 SUBJECT INITIALS NOT COLLECTED:                     [address]                          VISIT: D0                     [place], [place] [address]                   SPONSOR REPORT TO:                 COLLECTION TIME:11:22 DATE:12-Sep-2022                     Cruzita Lederer                 DATE RECEIVED IN LABORATORY:  13-Sep-2022                     c/o Sponsor [employer] access         DATE REPORTED BY LABORATORY: 13-Sep-2022                     [place]                          SEX: F  AGE: 52y                     [id number]                   [place], [place]  [phone]                                                                                        Ref. Ranges               Clinical    Comments                                                                          Significance                                                                            Yes*  No                    HEMATOLOGY&DIFFERENTIAL PANEL                      HGB            14.6         11.6-16.4 g/dL                      HCT            46           34-48 %                      RBC            5.4          4.1-5.6 x106/uL                      MCH            27           26-34 pg  MCHC           32           31-38 g/dL                      RDW            15.6    H    12.0-15.0 %                      RBC Morph      No Review Required                        MCV            86           79-98 fL                      WBC            5.81         3.80-10.70 x103/uL                      Neutrophil     4.13         1.96-7.23 x103/uL                      Lymphocyte     1.12         0.91-4.28 x103/uL                      Monocytes      0.32         0.12-0.92 x103/uL                      Eosinophil     0.17         0.00-0.57 x103/uL                      Basophils      0.05         0.00-0.20 x103/uL                      Neutrophil     71.2         40.5-75.0 %                      Lymphocyte     19.5         15.4-48.5%                      Monocytes      5.6          2.6-10.1 %                      Eosinophil     3.0          0.0-6.8 %                      Basophils      0.8          0.0-2.0 %                      Platelets      296  140-400 x103/uL ANC                      ANC            4.13         1.96-7.23 x103/uL                    HBA1C                      Fast HbA1c     10.9    H    <6.5%   RETICULOCYTES                      Retic %        2.6     H    0.6-2.5 %                      Retic Abs      0.140   H    0.030-0.120 x106/uL         [id number] LPA COLLECTION D/T                      Untimed D      12-Sep-2022                        Untimed T      11:22                            SM/PLASMA PROTEO & BMS D/T                      Untimed D      12-Sep-2022                        Untimed T      11:22                            SM/WB DNA COLLECTION D/T                      Untimed D      12-Sep-2022                        Untimed T      11:22                            SM/WB PAXGENE RNA COLLECT D/T                      Untimed D      12-Sep-2022                        Untimed T      11:22                            SUBJECT FASTED AT LEAST 9 HRS?                      Pt fast 9h     No

## 2022-09-16 ENCOUNTER — Telehealth: Payer: Self-pay | Admitting: *Deleted

## 2022-09-16 NOTE — Telephone Encounter (Signed)
Prior auth for Tramadol ER #[id number] initiated with [employer] via CoverMyMeds.

## 2022-09-17 DIAGNOSIS — E113513 Type 2 diabetes mellitus with proliferative diabetic retinopathy with macular edema, bilateral: Secondary | ICD-10-CM | POA: Diagnosis not present

## 2022-09-17 DIAGNOSIS — H20012 Primary iridocyclitis, left eye: Secondary | ICD-10-CM | POA: Diagnosis not present

## 2022-09-17 DIAGNOSIS — H53142 Visual discomfort, left eye: Secondary | ICD-10-CM | POA: Diagnosis not present

## 2022-09-17 DIAGNOSIS — H18052 Posterior corneal pigmentations, left eye: Secondary | ICD-10-CM | POA: Diagnosis not present

## 2022-09-17 NOTE — Telephone Encounter (Signed)
PA Tramadol ER 100 mg done via telephone. PA attempted via covermymeds was cancelled. Case [phone] Fax [phone] notes sent

## 2022-09-19 ENCOUNTER — Telehealth: Payer: Self-pay | Admitting: *Deleted

## 2022-09-19 NOTE — Telephone Encounter (Signed)
Patient plan denied Tramadol ER. Plan covers Morphine sulfate, Methadone and Xtampza ER oral capsule.Marland Kitchen

## 2022-09-19 NOTE — Telephone Encounter (Signed)
LVM detailed letting patient know her insurance denied Tramadol ER. AK does not feel comfortable with drugs covered by her plan. She can obtain a GoodRx card from the pharmacy is she wishes to pay out of pocket.

## 2022-09-22 NOTE — Progress Notes (Signed)
Patient seen today for enrollment in AA heart.    Subject Name: Tina [name]   Subject met inclusion and exclusion criteria.  The informed consent form, study requirements and expectations were reviewed with the subject and questions and concerns were addressed prior to the signing of the consent form.  The subject verbalized understanding of the trial requirements.  The subject agreed to participate in the AA HEART  trial and signed the informed consent at 10:18 AM on 09/12/2022.  The informed consent was obtained prior to performance of any protocol-specific procedures for the subject.  A copy of the signed informed consent was given to the subject and a copy was placed in the subject's medical record.    [place] Chalmers, Research Coordinator  This very nice lady is followed by Dr. Odis Hollingshead.  She has multiple issue, including HTN, OSA, IDDM, prior CVA, obesity, and NSTEMI treated with OM1 PCI in August of 2022.  She has been getting along reasonably well, and wants to ernoll in this registry.  I reviewed again the objectives of this clinical registry study.  Her weight has been variable, going up and down.    Exam  Alert, oriented female in NAD BP  144/65 P 85 R 18 wt. 252 lbs. T 98 Soft R carotid bruit  (prior dopplers 2018 Lungs clear  Abd  - no masses Ext - no edema  Patient enrolled in AA heart study.  Data pending.     Arturo Morton. Riley Kill, MD Medical Director, [facility] Cor regular with 1/6 SEM URSE  No diastolic murmur

## 2022-09-23 ENCOUNTER — Ambulatory Visit
Payer: Federal, State, Local not specified - PPO | Attending: Physical Medicine & Rehabilitation | Admitting: Physical Therapy

## 2022-09-23 ENCOUNTER — Encounter: Payer: Self-pay | Admitting: Physical Therapy

## 2022-09-23 DIAGNOSIS — M62838 Other muscle spasm: Secondary | ICD-10-CM | POA: Insufficient documentation

## 2022-09-23 DIAGNOSIS — M6281 Muscle weakness (generalized): Secondary | ICD-10-CM | POA: Insufficient documentation

## 2022-09-23 DIAGNOSIS — M25572 Pain in left ankle and joints of left foot: Secondary | ICD-10-CM | POA: Insufficient documentation

## 2022-09-23 DIAGNOSIS — R262 Difficulty in walking, not elsewhere classified: Secondary | ICD-10-CM | POA: Diagnosis present

## 2022-09-23 DIAGNOSIS — M25552 Pain in left hip: Secondary | ICD-10-CM | POA: Insufficient documentation

## 2022-09-23 DIAGNOSIS — M25551 Pain in right hip: Secondary | ICD-10-CM | POA: Diagnosis present

## 2022-09-23 DIAGNOSIS — M5459 Other low back pain: Secondary | ICD-10-CM | POA: Insufficient documentation

## 2022-09-23 NOTE — Patient Instructions (Signed)
Access Code: [email or web address] URL: [email or web address] Date: 09/23/2022 Prepared by: Camille Bal  Exercises - Supine Bridge  - 1 x daily - 4 x weekly - 2 sets - 10 reps - Supine Lower Trunk Rotation  - 1 x daily - 5 x weekly - 2-3 sets - 10 reps - Seated Ankle Alphabet  - 1 x daily - 4-5 x weekly - 1-2 sets - Sit to Stand Without Arm Support  - 1 x daily - 4 x weekly - 2-3 sets - 10 reps - Heel Raises with Counter Support  - 1 x daily - 4 x weekly - 2-3 sets - 15 reps

## 2022-09-23 NOTE — Therapy (Signed)
OUTPATIENT PHYSICAL THERAPY THORACOLUMBAR TREATMENT   Patient Name: Tina [name] MRN: [id number] DOB:[birth year], 52 y.o., female Today's Date: 09/23/2022  END OF SESSION:  PT End of Session - 09/23/22 1613     Visit Number 3    Number of Visits 9   8 + eval   Date for PT Re-Evaluation 11/07/22   pushed out due to scheduling preferences   Authorization Type [employer]    PT Start Time 1611    PT Stop Time 1657    PT Time Calculation (min) 46 min    Equipment Utilized During Treatment Gait belt    Activity Tolerance Patient limited by pain;Patient tolerated treatment well    Behavior During Therapy [facility] for tasks assessed/performed             Past Medical History:  Diagnosis Date   Allergy    Anemia    DURING MENSES--HAS HEAVY BLEEDING WITH PERODS   Angioedema 08/13/2020   Anxiety    Back pain, chronic    "ongoing"   Blood transfusion    IN 2012  AFTER C -SECTION   Cerebral thrombosis with cerebral infarction (HCC) 06/2009   RIGHT SIDED WEAKNESS ( ARM AND LEG ) AND SPASMS-remains with slight weakness and vertigo.   Constipation    Depression    Diabetes mellitus    Diabetic neuropathy (HCC)    BOTH FEET --COMES AND GOES   Edema, lower extremity    Endometriosis    Fatty liver    GERD (gastroesophageal reflux disease)    with pregnancy   H/O eye surgery    Headache(784.0)    MIGRAINES--NOT REALLY HEADACHE-MORE LIKE PRESSURE SENSATION IN HEAD-FEELS DIZZIY AND  FAINT AS THE PRESSURE RESOLVES   Hernia, incisional, RLQ, s/p lap repair Sep 2013 09/02/2011   History of vertigo 03/22/2018   Hx of migraines 10/19/2011   Hyperlipidemia    Hypertension    Leg pain, right    "like bad Charley horse"   Multiple food allergies    Panic disorder without agoraphobia    Rash    HANDS, ARMS --STATES HX OF RASH EVER SINCE CHILDBIRTH/PREGNANCY.  STATES THE RASH OFTEN OCCURS WHEN SHE IS REALLY STRESSED."goes and comes-presently left ring finger"   Restless  leg syndrome    DIAGNOSED BY SLEEP STUDY - PT TOLD SHE DID NOT HAVE SLEEP APNEA   Right rotator cuff tear    PAIN IN RIGHT SHOULDER   SBO (small bowel obstruction) (HCC) 06/03/2012   Shortness of breath    Spastic hemiplegia affecting dominant side (HCC)    Stomach problems    Stroke (HCC)    Ventral hernia    RIGHT LOWER QUADRANT-CAUSING SOME PAIN   Weakness of right side of body    Past Surgical History:  Procedure Laterality Date   APPLICATION OF WOUND VAC N/A 05/25/2015   Procedure: APPLICATION OF WOUND VAC;  Surgeon: Karie Soda, MD;  Location: [facility];  Service: General;  Laterality: N/A;   CARDIAC CATHETERIZATION     CESAREAN SECTION  2012   COLONOSCOPY     CORONARY STENT INTERVENTION N/A 09/05/2020   Procedure: CORONARY STENT INTERVENTION;  Surgeon: Elder Negus, MD;  Location: [facility];  Service: Cardiovascular;  Laterality: N/A;   CORONARY ULTRASOUND/IVUS N/A 09/05/2020   Procedure: Intravascular Ultrasound/IVUS;  Surgeon: Elder Negus, MD;  Location: [facility];  Service: Cardiovascular;  Laterality: N/A;   DIAGNOSTIC LAPAROSCOPY     ESOPHAGOGASTRODUODENOSCOPY  N/A 06/03/2012   Procedure: ESOPHAGOGASTRODUODENOSCOPY (EGD);  Surgeon: Charna Elizabeth, MD;  Location: [facility];  Service: Endoscopy;  Laterality: N/A;   EXCISION MASS ABDOMINAL N/A 05/25/2015   Procedure: ABDOMINAL WALL EXPLORATION EXCISION OF SEROMA REMOVAL OF REDUNDANT SKIN ;  Surgeon: Karie Soda, MD;  Location: [facility];  Service: General;  Laterality: N/A;   EYE SURGERY     Eye laser for vessel hemorrhaging   fybroid removal     HERNIA REPAIR  10/03/2011   ventral hernia repair   INSERTION OF MESH N/A 02/04/2013   Procedure: INSERTION OF MESH;  Surgeon: Ardeth Sportsman, MD;  Location: [facility];  Service: General;  Laterality: N/A;   LEFT HEART CATH AND CORONARY ANGIOGRAPHY N/A 09/05/2020   Procedure: LEFT HEART CATH AND CORONARY ANGIOGRAPHY;  Surgeon: Elder Negus,  MD;  Location: [facility];  Service: Cardiovascular;  Laterality: N/A;   PARS PLANA VITRECTOMY Left 08/24/2022   Procedure: PARS PLANA VITRECTOMY 25 GAUGE FOR ENDOPHTHALMITIS; INJECTION OF ANTIBIOTIC;  Surgeon: Carmela Rima, MD;  Location: [facility] OR;  Service: Ophthalmology;  Laterality: Left;   UMBILICAL HERNIA REPAIR N/A 02/04/2013   Procedure: LAPAROSCOPIC ventral wall hernia repair LAPAROSCOPIC LYSIS OF ADHESIONS laparoscopic exploration of abdomen ;  Surgeon: Ardeth Sportsman, MD;  Location: [facility];  Service: General;  Laterality: N/A;   UPPER GASTROINTESTINAL ENDOSCOPY     URETER REVISION     Bilateral "twisted"   UTERINE FIBROID SURGERY     2 SURGERIES FOR FIBROIDS   VENTRAL HERNIA REPAIR  10/03/2011   Procedure: LAPAROSCOPIC VENTRAL HERNIA;  Surgeon: Ardeth Sportsman, MD;  Location: [facility];  Service: General;  Laterality: N/A;   Patient Active Problem List   Diagnosis Date Noted   Poor compliance with CPAP treatment 09/04/2021   Elevated lipase 09/04/2021   Belching 09/04/2021   Gastroesophageal reflux disease 09/04/2021   Hypertensive emergency 09/09/2020   Chest pain    Elevated troponin    Status post coronary artery stent placement    Non-ST elevation (NSTEMI) myocardial infarction (HCC) 09/05/2020   NSTEMI (non-ST elevated myocardial infarction) (HCC) 09/05/2020   Hypersomnia, persistent 09/04/2020   OSA on CPAP 09/04/2020   Shortness of breath 08/18/2020   Allergy to alpha-gal 08/18/2020   Seasonal and perennial allergic rhinitis 08/18/2020   Allergy with anaphylaxis due to food 08/18/2020   Pruritus 08/18/2020   Pollen-food allergy 08/18/2020   Allergic reaction 08/13/2020   Angioedema 08/13/2020   Type 2 macular telangiectasis, bilateral 01/03/2020   Excessive daytime sleepiness 09/12/2019   Sleep paralysis 09/12/2019   Hypnopompic hallucination 09/12/2019   Abnormal dreams 09/12/2019   Diabetic macular edema of right eye with proliferative retinopathy  associated with type 2 diabetes mellitus (HCC) 07/04/2019   Stable treated proliferative diabetic retinopathy of left eye determined by examination associated with type 2 diabetes mellitus (HCC) 07/04/2019   Long-term insulin use (HCC) 04/07/2019   Depression 04/13/2018   History of vertigo 03/22/2018   Laboratory examination 03/22/2018   Chronic diastolic (congestive) heart failure (HCC) 03/22/2018   Other hyperlipidemia 02/15/2018   Vitamin D deficiency 12/31/2017   Class 2 severe obesity with serious comorbidity and body mass index (BMI) of 39.0 to 39.9 in adult (HCC) 11/18/2017   Gait disturbance, post-stroke 04/23/2017   Strain of gluteus medius 04/23/2017   OSA (obstructive sleep apnea) 10/17/2015   Periodic limb movement disorder (PLMD) 10/17/2015   Essential hypertension 05/26/2015   Diabetes mellitus (HCC) 05/26/2015   Abdominal wall  mass of right lower quadrant 05/25/2015   Hemiparesis affecting right side as late effect of stroke (HCC) 11/09/2014   Abdominal wall seroma    Sepsis due to undetermined organism (HCC) 03/06/2014   Nausea vomiting and diarrhea 03/06/2014   Sepsis (HCC) 03/06/2014   Tension headache 09/01/2013   Nausea 03/21/2013   Altered bowel habits 02/28/2013   Spastic hemiplegia affecting dominant side (HCC) 02/16/2013   Recurrent ventral incisional hernia s/p closure/repair w mesh 02/04/2013 01/26/2013   Constipation, chronic 01/26/2013   Abdominal pain, chronic, right lower quadrant 06/03/2012   History of CVA (cerebrovascular accident) 06/03/2012   HTN (hypertension) 06/03/2012   Diabetes mellitus type II, uncontrolled (HCC) 10/16/2011   Obesity (BMI 30-39.9) 09/02/2011   Rotator cuff tear 08/25/2011   Sciatica 06/10/2011   Paresthesias in right hand 06/10/2011   Lumbago 05/09/2011   Paresthesias 05/09/2011    PCP: Irena Reichmann, DO  REFERRING PROVIDER: Erick Colace, MD; Lucia Estelle D, DPM  REFERRING DIAG: M53.3 (ICD-10-CM) -  Sacroiliac dysfunction [phone] (ICD-10-CM) - Mild ankle sprain, left, initial encounter  R60.0 (ICD-10-CM) - Localized edema    Rationale for Evaluation and Treatment: Rehabilitation  THERAPY DIAG:  Other low back pain  Pain in left hip  Pain in right hip  Pain in left ankle and joints of left foot  Muscle weakness (generalized)  Difficulty in walking, not elsewhere classified  Other muscle spasm  ONSET DATE: July 21, 2022 (had a fall and pain started after)  SUBJECTIVE:                                                                                                                                                                                           SUBJECTIVE STATEMENT: Patient presents with concern over recent bruising, she remains on blood thinner.  Her back is not bothering her as bad today, but she has not been walking much and has been resting using a heating pad since recent girls trip.    Blood glucose - 196 just prior to session - pt states she has contacted PCP regarding being out of her insulin.  PERTINENT HISTORY:  DM2, HTN, L pontine and basal ganglia CVA, vertigo, NSTEMI  PAIN:  Are you having pain? Yes: NPRS scale: 4/10 Pain location: left side of back into glute region, right hip, left ankle, low back Pain description: burning, aching, sharp in ankle with movement Aggravating factors: walking, cleaning, standing too long, sitting too long Relieving factors: not much, sometimes repositioning or stretching  PRECAUTIONS: Fall and Other: glucose sensor on left arm (day 2 of 14 day sensor); some visual limitation  RED FLAGS:  None   WEIGHT BEARING RESTRICTIONS: No  FALLS:  Has patient fallen in last 6 months? Yes. 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  PATIENT SURVEYS:  Modified Oswestry 26/50 = moderate perceived disability   SCREENING FOR RED FLAGS: Bowel or bladder incontinence: No Spinal tumors: No Cauda equina syndrome: No Compression fracture: No Abdominal aneurysm: No  COGNITION: Overall cognitive status: Within functional limits for tasks assessed     SENSATION: Light touch: WFL and pt reports pins/needles down entire front of left LE  POSTURE: rounded shoulders and forward head  PALPATION: Tender over left hip and left PSIS region, diffusely sore over low back region  LUMBAR ROM:   AROM eval  Flexion Patient off balance in CAM boot; assessment deferred  Extension   Right lateral flexion   Left lateral flexion   Right rotation   Left rotation    (Blank rows = not tested)  LOWER EXTREMITY ROM:     Active  Right eval Left eval  Hip flexion Grossly WFL; unable to assess left ankle due to boot  Hip extension   Hip abduction   Hip adduction   Hip internal rotation   Hip external rotation   Knee flexion   Knee extension   Ankle dorsiflexion   Ankle plantarflexion    Ankle inversion    Ankle eversion     (Blank rows = not tested)  LOWER EXTREMITY MMT:    MMT Right eval Left eval  Hip flexion 3+/5 3/5; pain  Hip extension    Hip abduction    Hip adduction    Hip internal rotation    Hip external rotation    Knee flexion    Knee extension 4/5 3+/5; pain  Ankle dorsiflexion 5/5   Ankle plantarflexion    Ankle inversion    Ankle eversion     (Blank rows = not tested)  LUMBAR  SPECIAL TESTS:  Stork standing: unable to assess due to pain provoked w/ other testing, SI Compression/distraction test: Negative; pain/pressure where therapists hands were, repeated x2 w/ same results despite varied hand positioning, FABER test: positive; pinpoint left SIJ pain, and Gaenslen's test: negative; general low back pain, position limited by body habitus  FUNCTIONAL TESTS:  5 times sit to stand: To be assessed.  GAIT: Distance walked: various clinic distances Assistive device utilized: None Level of assistance: Complete Independence Comments: Antalgic, left hip hike secondary to boot on left foot.  This boot may be removed at appt 8/20.  TODAY'S TREATMENT:  DATE: 09/23/2022 -STS 3x10  Pt receives alarm on phone about rising blood glucose (253)  -Standing heel raises 2x15 -Anteriorly oriented tilt board holding level x2 minutes progressing to no UE support > UE supported board tilts forward and back for ankle mobility, reported low back pain starting towards end of task > pt self-initiates forward bent low back stretch using bars for support and reports brief period of vertigo which resolves with ~3 minutes seated rest -Lumbar rollouts forward x15 pacing with breathing using large physioball for support > attempted left rollouts w/ too much right lateral flank pain > right rollouts x15 w/o pain  PATIENT EDUCATION:  Education details:  Additions to HEP. Person educated: Patient and Spouse Education method: Explanation Education comprehension: verbalized understanding  HOME EXERCISE PROGRAM: Access Code: [email or web address] URL: [email or web address] Date: 09/23/2022 Prepared by: Camille Bal  Exercises - Supine Bridge  - 1 x daily - 4 x weekly - 2 sets - 10 reps - Supine Lower Trunk Rotation  - 1 x daily - 5 x weekly - 2-3 sets - 10 reps -  Seated Ankle Alphabet  - 1 x daily - 4-5 x weekly - 1-2 sets - Sit to Stand Without Arm Support  - 1 x daily - 4 x weekly - 2-3 sets - 10 reps - Heel Raises with Counter Support  - 1 x daily - 4 x weekly - 2-3 sets - 15 reps  ASSESSMENT:  CLINICAL IMPRESSION: Patient continues to have left oriented low back and ankle pain.  Focus of skilled session today on addressing ongoing ankle mobility and low back comfort.  She would benefit from progression to aerobic tolerance and increased strength training focus to improve efficiency of mobility.  OBJECTIVE IMPAIRMENTS: Abnormal gait, decreased activity tolerance, decreased balance, decreased endurance, decreased mobility, decreased strength, increased muscle spasms, impaired flexibility, impaired sensation, impaired vision/preception, improper body mechanics, postural dysfunction, obesity, and pain.   ACTIVITY LIMITATIONS: carrying, lifting, bending, standing, squatting, stairs, transfers, bathing, dressing, reach over head, hygiene/grooming, locomotion level, and caring for others  PARTICIPATION LIMITATIONS: meal prep, cleaning, laundry, driving, shopping, community activity, occupation, and yard work  PERSONAL FACTORS: Fitness, Past/current experiences, Sex, Transportation, and 1-2 comorbidities: HTN, DM2 (cannot have cortiosteroid injection for inflammation/pain relief)  are also affecting patient's functional outcome.   REHAB POTENTIAL: Fair see PMH and personal factors  CLINICAL DECISION MAKING: Evolving/moderate complexity  EVALUATION COMPLEXITY: Moderate   GOALS: Goals reviewed with patient? Yes  SHORT TERM GOALS: Target date: 10/03/2022  Pt will be independent and compliant with introductory strength and stretching HEP in order to maintain functional progress and improve mobility. Baseline:  To be established. Goal status: INITIAL  2.  Patient will report pain of less than or equal to 3/10 at rest in order to demonstrate improved  pain management. Baseline: 5/10 Goal status: INITIAL  3.  Pt will decrease 5xSTS to </=21.03 seconds in order to demonstrate decreased risk for falls and improved functional bilateral LE strength and power. Baseline: 31.03 sec w/ BUE support (8/29) Goal status: INITIAL  LONG TERM GOALS: Target date: 10/31/2022  Pt will be independent and compliant with advanced strength and stretching HEP in order to maintain functional progress and improve mobility. Baseline:  To be established. Goal status: INITIAL  2.  Pt will decrease 5xSTS to </=11.03 seconds in order to demonstrate decreased risk for falls and improved functional bilateral LE strength and power. Baseline: 31.03 sec w/ BUE support (8/29) Goal status: INITIAL  3.  Patient will  ambulate >/=250 feet without increased pain in order to improve functional mobility and ease of household ambulation. Baseline: all activities increased pain 8/19 Goal status: INITIAL  4.  Patient will improve modified Oswestry score to </= 17/50 in order to demonstrate improved subjective functioning and quality of life. Baseline: 26/50 Goal status: INITIAL  5.  Patient will improve LEFS score to >/=26 in order to demonstrate improved subjective functioning and quality of life. Baseline: 17/80 (8/29) Goal status: INITIAL PLAN:  PT FREQUENCY: 1x/week  PT DURATION: 8 weeks  PLANNED INTERVENTIONS: Therapeutic exercises, Therapeutic activity, Neuromuscular re-education, Balance training, Gait training, Patient/Family education, Self Care, Joint mobilization, Stair training, Vestibular training, DME instructions, Aquatic Therapy, Dry Needling, Electrical stimulation, Spinal mobilization, Moist heat, Taping, Manual therapy, and Re-evaluation.  PLAN FOR NEXT SESSION:  Add to functional strength and stretching HEP.  Taping for SIJ stability/bursitis?  TENS?  Ankle stability.  Hip strengthening.  STS.  ASSESS STGs!  Sadie Haber, PT, DPT 09/23/2022,  4:57 PM

## 2022-09-24 DIAGNOSIS — M20011 Mallet finger of right finger(s): Secondary | ICD-10-CM | POA: Diagnosis not present

## 2022-09-24 DIAGNOSIS — M25641 Stiffness of right hand, not elsewhere classified: Secondary | ICD-10-CM | POA: Diagnosis not present

## 2022-09-24 DIAGNOSIS — M25541 Pain in joints of right hand: Secondary | ICD-10-CM | POA: Diagnosis not present

## 2022-09-29 DIAGNOSIS — E113511 Type 2 diabetes mellitus with proliferative diabetic retinopathy with macular edema, right eye: Secondary | ICD-10-CM | POA: Diagnosis not present

## 2022-10-01 ENCOUNTER — Ambulatory Visit: Payer: Federal, State, Local not specified - PPO | Admitting: Physical Therapy

## 2022-10-01 ENCOUNTER — Encounter: Payer: Self-pay | Admitting: Physical Therapy

## 2022-10-01 DIAGNOSIS — M25552 Pain in left hip: Secondary | ICD-10-CM | POA: Diagnosis not present

## 2022-10-01 DIAGNOSIS — M5459 Other low back pain: Secondary | ICD-10-CM | POA: Diagnosis not present

## 2022-10-01 DIAGNOSIS — M25551 Pain in right hip: Secondary | ICD-10-CM | POA: Diagnosis not present

## 2022-10-01 DIAGNOSIS — M25572 Pain in left ankle and joints of left foot: Secondary | ICD-10-CM

## 2022-10-01 DIAGNOSIS — R262 Difficulty in walking, not elsewhere classified: Secondary | ICD-10-CM | POA: Diagnosis not present

## 2022-10-01 DIAGNOSIS — M6281 Muscle weakness (generalized): Secondary | ICD-10-CM | POA: Diagnosis not present

## 2022-10-01 DIAGNOSIS — M62838 Other muscle spasm: Secondary | ICD-10-CM

## 2022-10-01 NOTE — Therapy (Signed)
OUTPATIENT PHYSICAL THERAPY THORACOLUMBAR TREATMENT   Patient Name: Tina [name] MRN: [id number] DOB:Oct 06, 1970, 52 y.o., female Today's Date: 10/01/2022  END OF SESSION:  PT End of Session - 10/01/22 1623     Visit Number 4    Number of Visits 9   8 + eval   Date for PT Re-Evaluation 11/07/22   pushed out due to scheduling preferences   Authorization Type [employer]    PT Start Time 1620    PT Stop Time 1700    PT Time Calculation (min) 40 min    Equipment Utilized During Treatment Gait belt    Activity Tolerance Patient limited by pain;Patient tolerated treatment well    Behavior During Therapy [facility] for tasks assessed/performed             Past Medical History:  Diagnosis Date   Allergy    Anemia    DURING MENSES--HAS HEAVY BLEEDING WITH PERODS   Angioedema 08/13/2020   Anxiety    Back pain, chronic    "ongoing"   Blood transfusion    IN 2012  AFTER C -SECTION   Cerebral thrombosis with cerebral infarction (HCC) 06/2009   RIGHT SIDED WEAKNESS ( ARM AND LEG ) AND SPASMS-remains with slight weakness and vertigo.   Constipation    Depression    Diabetes mellitus    Diabetic neuropathy (HCC)    BOTH FEET --COMES AND GOES   Edema, lower extremity    Endometriosis    Fatty liver    GERD (gastroesophageal reflux disease)    with pregnancy   H/O eye surgery    Headache(784.0)    MIGRAINES--NOT REALLY HEADACHE-MORE LIKE PRESSURE SENSATION IN HEAD-FEELS DIZZIY AND  FAINT AS THE PRESSURE RESOLVES   Hernia, incisional, RLQ, s/p lap repair Sep 2013 09/02/2011   History of vertigo 03/22/2018   Hx of migraines 10/19/2011   Hyperlipidemia    Hypertension    Leg pain, right    "like bad Charley horse"   Multiple food allergies    Panic disorder without agoraphobia    Rash    HANDS, ARMS --STATES HX OF RASH EVER SINCE CHILDBIRTH/PREGNANCY.  STATES THE RASH OFTEN OCCURS WHEN SHE IS REALLY STRESSED."goes and comes-presently left ring finger"   Restless  leg syndrome    DIAGNOSED BY SLEEP STUDY - PT TOLD SHE DID NOT HAVE SLEEP APNEA   Right rotator cuff tear    PAIN IN RIGHT SHOULDER   SBO (small bowel obstruction) (HCC) 06/03/2012   Shortness of breath    Spastic hemiplegia affecting dominant side (HCC)    Stomach problems    Stroke (HCC)    Ventral hernia    RIGHT LOWER QUADRANT-CAUSING SOME PAIN   Weakness of right side of body    Past Surgical History:  Procedure Laterality Date   APPLICATION OF WOUND VAC N/A 05/25/2015   Procedure: APPLICATION OF WOUND VAC;  Surgeon: Karie Soda, MD;  Location: [facility];  Service: General;  Laterality: N/A;   CARDIAC CATHETERIZATION     CESAREAN SECTION  2012   COLONOSCOPY     CORONARY STENT INTERVENTION N/A 09/05/2020   Procedure: CORONARY STENT INTERVENTION;  Surgeon: Elder Negus, MD;  Location: [facility];  Service: Cardiovascular;  Laterality: N/A;   CORONARY ULTRASOUND/IVUS N/A 09/05/2020   Procedure: Intravascular Ultrasound/IVUS;  Surgeon: Elder Negus, MD;  Location: [facility];  Service: Cardiovascular;  Laterality: N/A;   DIAGNOSTIC LAPAROSCOPY     ESOPHAGOGASTRODUODENOSCOPY  N/A 06/03/2012   Procedure: ESOPHAGOGASTRODUODENOSCOPY (EGD);  Surgeon: Charna Elizabeth, MD;  Location: [facility] ENDOSCOPY;  Service: Endoscopy;  Laterality: N/A;   EXCISION MASS ABDOMINAL N/A 05/25/2015   Procedure: ABDOMINAL WALL EXPLORATION EXCISION OF SEROMA REMOVAL OF REDUNDANT SKIN ;  Surgeon: Karie Soda, MD;  Location: [facility];  Service: General;  Laterality: N/A;   EYE SURGERY     Eye laser for vessel hemorrhaging   fybroid removal     HERNIA REPAIR  10/03/2011   ventral hernia repair   INSERTION OF MESH N/A 02/04/2013   Procedure: INSERTION OF MESH;  Surgeon: Ardeth Sportsman, MD;  Location: [facility];  Service: General;  Laterality: N/A;   LEFT HEART CATH AND CORONARY ANGIOGRAPHY N/A 09/05/2020   Procedure: LEFT HEART CATH AND CORONARY ANGIOGRAPHY;  Surgeon: Elder Negus,  MD;  Location: [facility];  Service: Cardiovascular;  Laterality: N/A;   PARS PLANA VITRECTOMY Left 08/24/2022   Procedure: PARS PLANA VITRECTOMY 25 GAUGE FOR ENDOPHTHALMITIS; INJECTION OF ANTIBIOTIC;  Surgeon: Carmela Rima, MD;  Location: [facility] OR;  Service: Ophthalmology;  Laterality: Left;   UMBILICAL HERNIA REPAIR N/A 02/04/2013   Procedure: LAPAROSCOPIC ventral wall hernia repair LAPAROSCOPIC LYSIS OF ADHESIONS laparoscopic exploration of abdomen ;  Surgeon: Ardeth Sportsman, MD;  Location: [facility];  Service: General;  Laterality: N/A;   UPPER GASTROINTESTINAL ENDOSCOPY     URETER REVISION     Bilateral "twisted"   UTERINE FIBROID SURGERY     2 SURGERIES FOR FIBROIDS   VENTRAL HERNIA REPAIR  10/03/2011   Procedure: LAPAROSCOPIC VENTRAL HERNIA;  Surgeon: Ardeth Sportsman, MD;  Location: [facility];  Service: General;  Laterality: N/A;   Patient Active Problem List   Diagnosis Date Noted   Poor compliance with CPAP treatment 09/04/2021   Elevated lipase 09/04/2021   Belching 09/04/2021   Gastroesophageal reflux disease 09/04/2021   Hypertensive emergency 09/09/2020   Chest pain    Elevated troponin    Status post coronary artery stent placement    Non-ST elevation (NSTEMI) myocardial infarction (HCC) 09/05/2020   NSTEMI (non-ST elevated myocardial infarction) (HCC) 09/05/2020   Hypersomnia, persistent 09/04/2020   OSA on CPAP 09/04/2020   Shortness of breath 08/18/2020   Allergy to alpha-gal 08/18/2020   Seasonal and perennial allergic rhinitis 08/18/2020   Allergy with anaphylaxis due to food 08/18/2020   Pruritus 08/18/2020   Pollen-food allergy 08/18/2020   Allergic reaction 08/13/2020   Angioedema 08/13/2020   Type 2 macular telangiectasis, bilateral 01/03/2020   Excessive daytime sleepiness 09/12/2019   Sleep paralysis 09/12/2019   Hypnopompic hallucination 09/12/2019   Abnormal dreams 09/12/2019   Diabetic macular edema of right eye with proliferative retinopathy  associated with type 2 diabetes mellitus (HCC) 07/04/2019   Stable treated proliferative diabetic retinopathy of left eye determined by examination associated with type 2 diabetes mellitus (HCC) 07/04/2019   Long-term insulin use (HCC) 04/07/2019   Depression 04/13/2018   History of vertigo 03/22/2018   Laboratory examination 03/22/2018   Chronic diastolic (congestive) heart failure (HCC) 03/22/2018   Other hyperlipidemia 02/15/2018   Vitamin D deficiency 12/31/2017   Class 2 severe obesity with serious comorbidity and body mass index (BMI) of 39.0 to 39.9 in adult (HCC) 11/18/2017   Gait disturbance, post-stroke 04/23/2017   Strain of gluteus medius 04/23/2017   OSA (obstructive sleep apnea) 10/17/2015   Periodic limb movement disorder (PLMD) 10/17/2015   Essential hypertension 05/26/2015   Diabetes mellitus (HCC) 05/26/2015   Abdominal wall  mass of right lower quadrant 05/25/2015   Hemiparesis affecting right side as late effect of stroke (HCC) 11/09/2014   Abdominal wall seroma    Sepsis due to undetermined organism (HCC) 03/06/2014   Nausea vomiting and diarrhea 03/06/2014   Sepsis (HCC) 03/06/2014   Tension headache 09/01/2013   Nausea 03/21/2013   Altered bowel habits 02/28/2013   Spastic hemiplegia affecting dominant side (HCC) 02/16/2013   Recurrent ventral incisional hernia s/p closure/repair w mesh 02/04/2013 01/26/2013   Constipation, chronic 01/26/2013   Abdominal pain, chronic, right lower quadrant 06/03/2012   History of CVA (cerebrovascular accident) 06/03/2012   HTN (hypertension) 06/03/2012   Diabetes mellitus type II, uncontrolled (HCC) 10/16/2011   Obesity (BMI 30-39.9) 09/02/2011   Rotator cuff tear 08/25/2011   Sciatica 06/10/2011   Paresthesias in right hand 06/10/2011   Lumbago 05/09/2011   Paresthesias 05/09/2011    PCP: Irena Reichmann, DO  REFERRING PROVIDER: Erick Colace, MD; Lucia Estelle D, DPM  REFERRING DIAG: M53.3 (ICD-10-CM) -  Sacroiliac dysfunction [id number] (ICD-10-CM) - Mild ankle sprain, left, initial encounter  R60.0 (ICD-10-CM) - Localized edema    Rationale for Evaluation and Treatment: Rehabilitation  THERAPY DIAG:  Other low back pain  Pain in left hip  Pain in right hip  Pain in left ankle and joints of left foot  Muscle weakness (generalized)  Difficulty in walking, not elsewhere classified  Other muscle spasm  ONSET DATE: July 21, 2022 (had a fall and pain started after)  SUBJECTIVE:                                                                                                                                                                                           SUBJECTIVE STATEMENT: Patient presents with spouse today.  Patient reports increased low back pain today and she has not tried heat or anything to manage this today.    Blood glucose - 275 just prior to session - her insulin has been refilled and she is using it.  PERTINENT HISTORY:  DM2, HTN, L pontine and basal ganglia CVA, vertigo, NSTEMI  PAIN:  Are you having pain? Yes: NPRS scale: 8/10 Pain location: entire low back Pain description: stabbing, burning Aggravating factors: walking, cleaning, standing too long, sitting too long Relieving factors: not much, sometimes repositioning or stretching  PRECAUTIONS: Fall and Other: glucose sensor on left arm (day 2 of 14 day sensor); some visual limitation  RED FLAGS: None   WEIGHT BEARING RESTRICTIONS: No  FALLS:  Has patient fallen in last 6 months? Yes. 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  PATIENT SURVEYS:  Modified Oswestry 26/50 = moderate perceived disability   SCREENING FOR RED FLAGS: Bowel or bladder incontinence: No Spinal tumors: No Cauda equina syndrome: No Compression fracture: No Abdominal aneurysm: No  COGNITION: Overall cognitive status: Within functional limits for tasks assessed     SENSATION: Light touch: WFL and pt reports pins/needles down entire front of left LE  POSTURE: rounded shoulders and forward head  PALPATION: Tender over left hip and left PSIS region, diffusely sore over low back region  LUMBAR ROM:   AROM eval  Flexion Patient off balance in CAM boot; assessment deferred  Extension   Right lateral flexion   Left lateral flexion   Right rotation   Left rotation    (Blank rows = not tested)  LOWER EXTREMITY ROM:     Active  Right eval Left eval  Hip flexion Grossly WFL; unable to assess left ankle due to boot  Hip extension   Hip abduction   Hip adduction   Hip internal rotation   Hip external rotation   Knee flexion   Knee extension   Ankle dorsiflexion   Ankle plantarflexion    Ankle inversion    Ankle eversion     (Blank rows = not tested)  LOWER EXTREMITY MMT:    MMT Right eval Left eval  Hip flexion 3+/5 3/5; pain  Hip extension    Hip abduction    Hip adduction    Hip internal rotation    Hip external rotation    Knee flexion    Knee extension 4/5 3+/5; pain  Ankle dorsiflexion 5/5   Ankle plantarflexion    Ankle inversion    Ankle eversion     (Blank rows = not tested)  LUMBAR SPECIAL TESTS:  Stork standing: unable to assess due to pain provoked w/ other testing, SI Compression/distraction test: Negative; pain/pressure where therapists hands  were, repeated x2 w/ same results despite varied hand positioning, FABER test: positive; pinpoint left SIJ pain, and Gaenslen's test: negative; general low back pain, position limited by body habitus  FUNCTIONAL TESTS:  5 times sit to stand: To be assessed.  GAIT: Distance walked: various clinic distances Assistive device utilized: None Level of assistance: Complete Independence Comments: Antalgic, left hip hike secondary to boot on left foot.  This boot may be removed at appt 8/20.  TODAY'S TREATMENT:  DATE: 10/01/2022 -lumbar forward rollouts 3x10, cued for breathing to relax into low back stretch, attempted bilateral rollouts but having pain with both today. -Time spent setting up TENS for low back pain management (utilized for duration of treatment) -Hook-lying hip adduction w/ ball 2x12 -Lower trunk rotations x20 -Supine marching 2x20 -Verbally reviewed HEP, pt has done mostly her ankle exercises, has current copy. -Supine PPT x20 -Supine hip hike x15 each LE -5xSTS: 32.81 sec w/o UE support  PATIENT EDUCATION:  Education details:  Continue HEP.  Discussed TENS use in future session and if benefit pt could purchase personal one for training to use at home. Person educated: Patient and Spouse Education method: Explanation Education comprehension: verbalized understanding  HOME EXERCISE PROGRAM: Access Code: [email or web address] URL: [email or web address] Date: 09/23/2022 Prepared by: Camille Bal  Exercises - Supine Bridge  - 1 x daily - 4 x weekly - 2 sets - 10 reps - Supine Lower Trunk Rotation  - 1 x daily - 5 x weekly - 2-3 sets - 10 reps - Seated Ankle Alphabet  - 1 x daily - 4-5 x weekly - 1-2 sets - Sit to Stand Without Arm Support  - 1 x daily - 4 x weekly - 2-3 sets - 10 reps - Heel Raises with Counter Support  - 1 x daily - 4 x weekly  - 2-3 sets - 15 reps  ASSESSMENT:  CLINICAL IMPRESSION: Patient was severely limited by increased bilateral low back pain this session.  TENS focused on more left orientation for duration of session due to prior issues with left laterality.  Regressed to supine therex to improve mobility of pelvis and lumbar spine.  She continues to benefit from skilled PT to address mobility and pain deficits for improved quality of life.  OBJECTIVE IMPAIRMENTS: Abnormal gait, decreased activity tolerance, decreased balance, decreased endurance, decreased mobility, decreased strength, increased muscle spasms, impaired flexibility, impaired sensation, impaired vision/preception, improper body mechanics, postural dysfunction, obesity, and pain.   ACTIVITY LIMITATIONS: carrying, lifting, bending, standing, squatting, stairs, transfers, bathing, dressing, reach over head, hygiene/grooming, locomotion level, and caring for others  PARTICIPATION LIMITATIONS: meal prep, cleaning, laundry, driving, shopping, community activity, occupation, and yard work  PERSONAL FACTORS: Fitness, Past/current experiences, Sex, Transportation, and 1-2 comorbidities: HTN, DM2 (cannot have cortiosteroid injection for inflammation/pain relief)  are also affecting patient's functional outcome.   REHAB POTENTIAL: Fair see PMH and personal factors  CLINICAL DECISION MAKING: Evolving/moderate complexity  EVALUATION COMPLEXITY: Moderate   GOALS: Goals reviewed with patient? Yes  SHORT TERM GOALS: Target date: 10/03/2022  Pt will be independent and compliant with introductory strength and stretching HEP in order to maintain functional progress and improve mobility. Baseline:  Patient is intermittently compliant (9/18) Goal status: IN PROGRESS  2.  Patient will report pain of less than or equal to 3/10 at rest in order to demonstrate improved pain management. Baseline: 5/10; 8/10 (9/18) Goal status: NOT MET  3.  Pt will decrease  5xSTS to </=21.03 seconds in order to demonstrate decreased risk for falls and improved functional bilateral LE strength and power. Baseline: 31.03 sec w/ BUE support (8/29); 32.81 sec w/o UE support (9/18) Goal status: IN PROGRESS  LONG TERM GOALS: Target date: 10/31/2022  Pt will be independent and compliant with advanced strength and stretching HEP in order to maintain functional progress and improve mobility. Baseline:  To be established. Goal status: INITIAL  2.  Pt will decrease 5xSTS to </=11.03 seconds in order to demonstrate decreased risk for falls  and improved functional bilateral LE strength and power. Baseline: 31.03 sec w/ BUE support (8/29) Goal status: INITIAL  3.  Patient will ambulate >/=250 feet without increased pain in order to improve functional mobility and ease of household ambulation. Baseline: all activities increased pain 8/19 Goal status: INITIAL  4.  Patient will improve modified Oswestry score to </= 17/50 in order to demonstrate improved subjective functioning and quality of life. Baseline: 26/50 Goal status: INITIAL  5.  Patient will improve LEFS score to >/=26 in order to demonstrate improved subjective functioning and quality of life. Baseline: 17/80 (8/29) Goal status: INITIAL PLAN:  PT FREQUENCY: 1x/week  PT DURATION: 8 weeks  PLANNED INTERVENTIONS: Therapeutic exercises, Therapeutic activity, Neuromuscular re-education, Balance training, Gait training, Patient/Family education, Self Care, Joint mobilization, Stair training, Vestibular training, DME instructions, Aquatic Therapy, Dry Needling, Electrical stimulation, Spinal mobilization, Moist heat, Taping, Manual therapy, and Re-evaluation.  PLAN FOR NEXT SESSION:  Add to functional strength and stretching HEP.  Taping for SIJ stability/bursitis?  TENS prn.  Ankle stability.  Hip strengthening.  STS.  Sadie Haber, PT, DPT 10/01/2022, 5:19 PM

## 2022-10-03 ENCOUNTER — Encounter: Payer: Self-pay | Admitting: Physical Medicine & Rehabilitation

## 2022-10-03 ENCOUNTER — Encounter
Payer: Federal, State, Local not specified - PPO | Attending: Physical Medicine & Rehabilitation | Admitting: Physical Medicine & Rehabilitation

## 2022-10-03 VITALS — BP 119/73 | HR 64 | Ht 66.0 in | Wt 266.0 lb

## 2022-10-03 DIAGNOSIS — M533 Sacrococcygeal disorders, not elsewhere classified: Secondary | ICD-10-CM

## 2022-10-03 MED ORDER — ACETAMINOPHEN-CODEINE 300-30 MG PO TABS: 1.0000 | ORAL_TABLET | Freq: Three times a day (TID) | ORAL | 1 refills | Status: DC | PRN

## 2022-10-03 NOTE — Patient Instructions (Signed)
If sacroiliac injection not effective would order Lumbar MRI

## 2022-10-03 NOTE — Progress Notes (Signed)
Subjective:    Patient ID: Corey Harold, female    DOB: 1970-09-28, 52 y.o.   MRN: [id number]  HPI  Right sided low back pain which has been flaring up since a fall back in June.  Was seen in the emergency department lumbar imaging studies at that time did not show any  fractures.  She was complaining of primary knee pain. The patient has started with physical therapy but thus far does not feel any benefit from this. She has continued on her tramadol and has added Tylenol. She has no new lower extremity weakness.  She has chronic right sided mild hemiparesis due to prior left pontine CVA many years ago.   Pain with standing and sitting   No new numbness or tingling in the leg  Pain Inventory Average Pain 8 Pain Right Now 10 My pain is constant, burning, and stabbing  In the last 24 hours, has pain interfered with the following? General activity 8 Relation with others 8 Enjoyment of life 10 What TIME of day is your pain at its worst? morning , daytime, evening, and night Sleep (in general) Poor  Pain is worse with: walking, bending, and standing Pain improves with: rest Relief from Meds:  .  Family History  Problem Relation Age of Onset   Diabetes Father    Kidney disease Father    Depression Father    Drug abuse Father    Allergic rhinitis Mother    Eczema Mother    Urticaria Mother    Depression Mother    Anxiety disorder Mother    Bipolar disorder Mother    Alcoholism Mother    Drug abuse Mother    Eating disorder Mother    Diabetes Maternal Grandmother    Hyperlipidemia Paternal Grandmother    Stroke Paternal Grandmother    Eczema Sister    Urticaria Sister    Colon cancer Paternal Uncle    Other Neg Hx    Angioedema Neg Hx    Asthma Neg Hx    Colon polyps Neg Hx    Esophageal cancer Neg Hx    Rectal cancer Neg Hx    Stomach cancer Neg Hx    Social History   Socioeconomic History   Marital status: Married    Spouse name: Jeannett Senior   Number of  children: 3   Years of education: BA degree   Highest education level: Not on file  Occupational History   Occupation: stay at home mom    Employer: UNEMPLOYED  Tobacco Use   Smoking status: Never   Smokeless tobacco: Never  Vaping Use   Vaping status: Never Used  Substance and Sexual Activity   Alcohol use: No    Alcohol/week: 0.0 standard drinks of alcohol   Drug use: Not Currently   Sexual activity: Yes    Birth control/protection: None  Other Topics Concern   Not on file  Social History Narrative   Patient is married with 2 children.   Patient is right handed.   Patient has her  BA degree.   Patient drinks 1 cup daily.   Social Determinants of Health   Financial Resource Strain: Not on file  Food Insecurity: No Food Insecurity (07/27/2019)   Hunger Vital Sign    Worried About Running Out of Food in the Last Year: Never true    Ran Out of Food in the Last Year: Never true  Transportation Needs: No Transportation Needs (07/27/2019)   PRAPARE - Transportation  Lack of Transportation (Medical): No    Lack of Transportation (Non-Medical): No  Physical Activity: Not on file  Stress: Not on file  Social Connections: Not on file   Past Surgical History:  Procedure Laterality Date   APPLICATION OF WOUND VAC N/A 05/25/2015   Procedure: APPLICATION OF WOUND VAC;  Surgeon: Karie Soda, MD;  Location: [facility];  Service: General;  Laterality: N/A;   CARDIAC CATHETERIZATION     CESAREAN SECTION  2012   COLONOSCOPY     CORONARY STENT INTERVENTION N/A 09/05/2020   Procedure: CORONARY STENT INTERVENTION;  Surgeon: Elder Negus, MD;  Location: [facility];  Service: Cardiovascular;  Laterality: N/A;   CORONARY ULTRASOUND/IVUS N/A 09/05/2020   Procedure: Intravascular Ultrasound/IVUS;  Surgeon: Elder Negus, MD;  Location: [facility];  Service: Cardiovascular;  Laterality: N/A;   DIAGNOSTIC LAPAROSCOPY     ESOPHAGOGASTRODUODENOSCOPY N/A 06/03/2012    Procedure: ESOPHAGOGASTRODUODENOSCOPY (EGD);  Surgeon: Charna Elizabeth, MD;  Location: [facility];  Service: Endoscopy;  Laterality: N/A;   EXCISION MASS ABDOMINAL N/A 05/25/2015   Procedure: ABDOMINAL WALL EXPLORATION EXCISION OF SEROMA REMOVAL OF REDUNDANT SKIN ;  Surgeon: Karie Soda, MD;  Location: [facility];  Service: General;  Laterality: N/A;   EYE SURGERY     Eye laser for vessel hemorrhaging   fybroid removal     HERNIA REPAIR  10/03/2011   ventral hernia repair   INSERTION OF MESH N/A 02/04/2013   Procedure: INSERTION OF MESH;  Surgeon: Ardeth Sportsman, MD;  Location: [facility];  Service: General;  Laterality: N/A;   LEFT HEART CATH AND CORONARY ANGIOGRAPHY N/A 09/05/2020   Procedure: LEFT HEART CATH AND CORONARY ANGIOGRAPHY;  Surgeon: Elder Negus, MD;  Location: [facility];  Service: Cardiovascular;  Laterality: N/A;   PARS PLANA VITRECTOMY Left 08/24/2022   Procedure: PARS PLANA VITRECTOMY 25 GAUGE FOR ENDOPHTHALMITIS; INJECTION OF ANTIBIOTIC;  Surgeon: Carmela Rima, MD;  Location: [facility] OR;  Service: Ophthalmology;  Laterality: Left;   UMBILICAL HERNIA REPAIR N/A 02/04/2013   Procedure: LAPAROSCOPIC ventral wall hernia repair LAPAROSCOPIC LYSIS OF ADHESIONS laparoscopic exploration of abdomen ;  Surgeon: Ardeth Sportsman, MD;  Location: [facility];  Service: General;  Laterality: N/A;   UPPER GASTROINTESTINAL ENDOSCOPY     URETER REVISION     Bilateral "twisted"   UTERINE FIBROID SURGERY     2 SURGERIES FOR FIBROIDS   VENTRAL HERNIA REPAIR  10/03/2011   Procedure: LAPAROSCOPIC VENTRAL HERNIA;  Surgeon: Ardeth Sportsman, MD;  Location: [facility];  Service: General;  Laterality: N/A;   Past Surgical History:  Procedure Laterality Date   APPLICATION OF WOUND VAC N/A 05/25/2015   Procedure: APPLICATION OF WOUND VAC;  Surgeon: Karie Soda, MD;  Location: [facility];  Service: General;  Laterality: N/A;   CARDIAC CATHETERIZATION     CESAREAN SECTION  2012   COLONOSCOPY     CORONARY  STENT INTERVENTION N/A 09/05/2020   Procedure: CORONARY STENT INTERVENTION;  Surgeon: Elder Negus, MD;  Location: [facility];  Service: Cardiovascular;  Laterality: N/A;   CORONARY ULTRASOUND/IVUS N/A 09/05/2020   Procedure: Intravascular Ultrasound/IVUS;  Surgeon: Elder Negus, MD;  Location: [facility];  Service: Cardiovascular;  Laterality: N/A;   DIAGNOSTIC LAPAROSCOPY     ESOPHAGOGASTRODUODENOSCOPY N/A 06/03/2012   Procedure: ESOPHAGOGASTRODUODENOSCOPY (EGD);  Surgeon: Charna Elizabeth, MD;  Location: [facility] END[facility]COPY;  Service: Endoscopy;  Laterality: N/A;   EXCISION MASS ABDOMINAL N/A 05/25/2015  Procedure: ABDOMINAL WALL EXPLORATION EXCISION OF SEROMA REMOVAL OF REDUNDANT SKIN ;  Surgeon: Karie Soda, MD;  Location: [facility];  Service: General;  Laterality: N/A;   EYE SURGERY     Eye laser for vessel hemorrhaging   fybroid removal     HERNIA REPAIR  10/03/2011   ventral hernia repair   INSERTION OF MESH N/A 02/04/2013   Procedure: INSERTION OF MESH;  Surgeon: Ardeth Sportsman, MD;  Location: [facility];  Service: General;  Laterality: N/A;   LEFT HEART CATH AND CORONARY ANGIOGRAPHY N/A 09/05/2020   Procedure: LEFT HEART CATH AND CORONARY ANGIOGRAPHY;  Surgeon: Elder Negus, MD;  Location: [facility];  Service: Cardiovascular;  Laterality: N/A;   PARS PLANA VITRECTOMY Left 08/24/2022   Procedure: PARS PLANA VITRECTOMY 25 GAUGE FOR ENDOPHTHALMITIS; INJECTION OF ANTIBIOTIC;  Surgeon: Carmela Rima, MD;  Location: [facility] OR;  Service: Ophthalmology;  Laterality: Left;   UMBILICAL HERNIA REPAIR N/A 02/04/2013   Procedure: LAPAROSCOPIC ventral wall hernia repair LAPAROSCOPIC LYSIS OF ADHESIONS laparoscopic exploration of abdomen ;  Surgeon: Ardeth Sportsman, MD;  Location: [facility];  Service: General;  Laterality: N/A;   UPPER GASTROINTESTINAL ENDOSCOPY     URETER REVISION     Bilateral "twisted"   UTERINE FIBROID SURGERY     2 SURGERIES FOR FIBROIDS    VENTRAL HERNIA REPAIR  10/03/2011   Procedure: LAPAROSCOPIC VENTRAL HERNIA;  Surgeon: Ardeth Sportsman, MD;  Location: [facility];  Service: General;  Laterality: N/A;   Past Medical History:  Diagnosis Date   Allergy    Anemia    DURING MENSES--HAS HEAVY BLEEDING WITH PERODS   Angioedema 08/13/2020   Anxiety    Back pain, chronic    "ongoing"   Blood transfusion    IN 2012  AFTER C -SECTION   Cerebral thrombosis with cerebral infarction (HCC) 06/2009   RIGHT SIDED WEAKNESS ( ARM AND LEG ) AND SPASMS-remains with slight weakness and vertigo.   Constipation    Depression    Diabetes mellitus    Diabetic neuropathy (HCC)    BOTH FEET --COMES AND GOES   Edema, lower extremity    Endometriosis    Fatty liver    GERD (gastroesophageal reflux disease)    with pregnancy   H/O eye surgery    Headache(784.0)    MIGRAINES--NOT REALLY HEADACHE-MORE LIKE PRESSURE SENSATION IN HEAD-FEELS DIZZIY AND  FAINT AS THE PRESSURE RESOLVES   Hernia, incisional, RLQ, s/p lap repair Sep 2013 09/02/2011   History of vertigo 03/22/2018   Hx of migraines 10/19/2011   Hyperlipidemia    Hypertension    Leg pain, right    "like bad Charley horse"   Multiple food allergies    Panic disorder without agoraphobia    Rash    HANDS, ARMS --STATES HX OF RASH EVER SINCE CHILDBIRTH/PREGNANCY.  STATES THE RASH OFTEN OCCURS WHEN SHE IS REALLY STRESSED."goes and comes-presently left ring finger"   Restless leg syndrome    DIAGNOSED BY SLEEP STUDY - PT TOLD SHE DID NOT HAVE SLEEP APNEA   Right rotator cuff tear    PAIN IN RIGHT SHOULDER   SBO (small bowel obstruction) (HCC) 06/03/2012   Shortness of breath    Spastic hemiplegia affecting dominant side (HCC)    Stomach problems    Stroke (HCC)    Ventral hernia    RIGHT LOWER QUADRANT-CAUSING SOME PAIN   Weakness of right side of body    BP 119/73   Pulse  64   Ht 5\' 6"  (1.676 m)   Wt 266 lb (120.7 kg)   SpO2 96%   BMI 42.93 kg/m   Opioid Risk Score:    Fall Risk Score:  `1  Depression screen PHQ 2/9     09/11/2022    2:54 PM 08/12/2022    2:54 PM 09/03/2021    1:23 PM 07/11/2021   11:20 AM 05/30/2021   10:56 AM 03/26/2021   10:21 AM 01/02/2021    4:30 PM  Depression screen PHQ 2/9  Decreased Interest 0 0 1 0 1 1 0  Down, Depressed, Hopeless 0 0 1 0 1 1 0  PHQ - 2 Score 0 0 2 0 2 2 0    Review of Systems  Musculoskeletal:        Right hip pain going down leg Right wrist pain  All other systems reviewed and are negative.     Objective:   Physical Exam Vitals reviewed.  Constitutional:      Appearance: She is obese.  HENT:     Head: Normocephalic and atraumatic.  Eyes:     Extraocular Movements: Extraocular movements intact.     Conjunctiva/sclera: Conjunctivae normal.     Pupils: Pupils are equal, round, and reactive to light.  Neurological:     Mental Status: She is alert.  Psychiatric:        Mood and Affect: Affect is labile.        Speech: Speech normal.        Behavior: Behavior normal. Behavior is cooperative.   Motor strength is 4/5 in the right deltoid bicep tricep grip as well as hip flexor knee extensor ankle dorsiflexor 5/5 in left deltoid by stress of grip hip flexor knee extensor ankle dorsiflexor  Gaenslens: Positive right Sacral thrust (prone) : Positive bilateral Lateral compression: Negative FABER's: Positive right greater than the left Distraction (supine): Negative Thigh thrust test: Positive right greater than left        Assessment & Plan:   1.  Acute exacerbation of chronic low back pain has been ongoing for over 2 months now.  Physical therapy initiated thus far no relief.  Will schedule for sacroiliac injection on the right side given that she is having difficulty with her pain management in physical therapy.  Will switch from tramadol temporarily to Tylenol with codeine 1 p.o. 3 times daily as needed.  If she does not get any relief from the sacroiliac injection would proceed to repeat  lumbar MRI to further assess.

## 2022-10-04 DIAGNOSIS — E1169 Type 2 diabetes mellitus with other specified complication: Secondary | ICD-10-CM | POA: Diagnosis not present

## 2022-10-04 DIAGNOSIS — E1165 Type 2 diabetes mellitus with hyperglycemia: Secondary | ICD-10-CM | POA: Diagnosis not present

## 2022-10-04 DIAGNOSIS — E1122 Type 2 diabetes mellitus with diabetic chronic kidney disease: Secondary | ICD-10-CM | POA: Diagnosis not present

## 2022-10-06 DIAGNOSIS — E113592 Type 2 diabetes mellitus with proliferative diabetic retinopathy without macular edema, left eye: Secondary | ICD-10-CM | POA: Diagnosis not present

## 2022-10-08 ENCOUNTER — Encounter: Payer: Self-pay | Admitting: Physical Therapy

## 2022-10-08 ENCOUNTER — Ambulatory Visit: Payer: Federal, State, Local not specified - PPO | Admitting: Physical Therapy

## 2022-10-08 DIAGNOSIS — M62838 Other muscle spasm: Secondary | ICD-10-CM

## 2022-10-08 DIAGNOSIS — M6281 Muscle weakness (generalized): Secondary | ICD-10-CM

## 2022-10-08 DIAGNOSIS — M5459 Other low back pain: Secondary | ICD-10-CM | POA: Diagnosis not present

## 2022-10-08 DIAGNOSIS — M25552 Pain in left hip: Secondary | ICD-10-CM | POA: Diagnosis not present

## 2022-10-08 DIAGNOSIS — R262 Difficulty in walking, not elsewhere classified: Secondary | ICD-10-CM

## 2022-10-08 DIAGNOSIS — M25572 Pain in left ankle and joints of left foot: Secondary | ICD-10-CM | POA: Diagnosis not present

## 2022-10-08 DIAGNOSIS — E1165 Type 2 diabetes mellitus with hyperglycemia: Secondary | ICD-10-CM | POA: Diagnosis not present

## 2022-10-08 DIAGNOSIS — M25551 Pain in right hip: Secondary | ICD-10-CM

## 2022-10-08 NOTE — Therapy (Signed)
OUTPATIENT PHYSICAL THERAPY THORACOLUMBAR TREATMENT   Patient Name: Tina [name] MRN: [id number] DOB:02-07-1970, 52 y.o., female Today's Date: 10/08/2022  END OF SESSION:  PT End of Session - 10/08/22 1632     Visit Number 5    Number of Visits 9   8 + eval   Date for PT Re-Evaluation 11/07/22   pushed out due to scheduling preferences   Authorization Type [employer]    PT Start Time 1626   PT with pt prior   PT Stop Time 1716    PT Time Calculation (min) 50 min    Equipment Utilized During Treatment Gait belt    Activity Tolerance Patient limited by pain;Patient tolerated treatment well    Behavior During Therapy [facility] for tasks assessed/performed             Past Medical History:  Diagnosis Date   Allergy    Anemia    DURING MENSES--HAS HEAVY BLEEDING WITH PERODS   Angioedema 08/13/2020   Anxiety    Back pain, chronic    "ongoing"   Blood transfusion    IN 2012  AFTER C -SECTION   Cerebral thrombosis with cerebral infarction (HCC) 06/2009   RIGHT SIDED WEAKNESS ( ARM AND LEG ) AND SPASMS-remains with slight weakness and vertigo.   Constipation    Depression    Diabetes mellitus    Diabetic neuropathy (HCC)    BOTH FEET --COMES AND GOES   Edema, lower extremity    Endometriosis    Fatty liver    GERD (gastroesophageal reflux disease)    with pregnancy   H/O eye surgery    Headache(784.0)    MIGRAINES--NOT REALLY HEADACHE-MORE LIKE PRESSURE SENSATION IN HEAD-FEELS DIZZIY AND  FAINT AS THE PRESSURE RESOLVES   Hernia, incisional, RLQ, s/p lap repair Sep 2013 09/02/2011   History of vertigo 03/22/2018   Hx of migraines 10/19/2011   Hyperlipidemia    Hypertension    Leg pain, right    "like bad Charley horse"   Multiple food allergies    Panic disorder without agoraphobia    Rash    HANDS, ARMS --STATES HX OF RASH EVER SINCE CHILDBIRTH/PREGNANCY.  STATES THE RASH OFTEN OCCURS WHEN SHE IS REALLY STRESSED."goes and comes-presently left ring  finger"   Restless leg syndrome    DIAGNOSED BY SLEEP STUDY - PT TOLD SHE DID NOT HAVE SLEEP APNEA   Right rotator cuff tear    PAIN IN RIGHT SHOULDER   SBO (small bowel obstruction) (HCC) 06/03/2012   Shortness of breath    Spastic hemiplegia affecting dominant side (HCC)    Stomach problems    Stroke (HCC)    Ventral hernia    RIGHT LOWER QUADRANT-CAUSING SOME PAIN   Weakness of right side of body    Past Surgical History:  Procedure Laterality Date   APPLICATION OF WOUND VAC N/A 05/25/2015   Procedure: APPLICATION OF WOUND VAC;  Surgeon: Karie Soda, MD;  Location: [facility];  Service: General;  Laterality: N/A;   CARDIAC CATHETERIZATION     CESAREAN SECTION  2012   COLONOSCOPY     CORONARY STENT INTERVENTION N/A 09/05/2020   Procedure: CORONARY STENT INTERVENTION;  Surgeon: Elder Negus, MD;  Location: [facility];  Service: Cardiovascular;  Laterality: N/A;   CORONARY ULTRASOUND/IVUS N/A 09/05/2020   Procedure: Intravascular Ultrasound/IVUS;  Surgeon: Elder Negus, MD;  Location: [facility];  Service: Cardiovascular;  Laterality: N/A;   DIAGNOSTIC LAPAROSCOPY  ESOPHAGOGASTRODUODENOSCOPY N/A 06/03/2012   Procedure: ESOPHAGOGASTRODUODENOSCOPY (EGD);  Surgeon: Charna Elizabeth, MD;  Location: [facility] ENDOSCOPY;  Service: Endoscopy;  Laterality: N/A;   EXCISION MASS ABDOMINAL N/A 05/25/2015   Procedure: ABDOMINAL WALL EXPLORATION EXCISION OF SEROMA REMOVAL OF REDUNDANT SKIN ;  Surgeon: Karie Soda, MD;  Location: [facility];  Service: General;  Laterality: N/A;   EYE SURGERY     Eye laser for vessel hemorrhaging   fybroid removal     HERNIA REPAIR  10/03/2011   ventral hernia repair   INSERTION OF MESH N/A 02/04/2013   Procedure: INSERTION OF MESH;  Surgeon: Ardeth Sportsman, MD;  Location: [facility];  Service: General;  Laterality: N/A;   LEFT HEART CATH AND CORONARY ANGIOGRAPHY N/A 09/05/2020   Procedure: LEFT HEART CATH AND CORONARY ANGIOGRAPHY;  Surgeon:  Elder Negus, MD;  Location: [facility];  Service: Cardiovascular;  Laterality: N/A;   PARS PLANA VITRECTOMY Left 08/24/2022   Procedure: PARS PLANA VITRECTOMY 25 GAUGE FOR ENDOPHTHALMITIS; INJECTION OF ANTIBIOTIC;  Surgeon: Carmela Rima, MD;  Location: [facility] OR;  Service: Ophthalmology;  Laterality: Left;   UMBILICAL HERNIA REPAIR N/A 02/04/2013   Procedure: LAPAROSCOPIC ventral wall hernia repair LAPAROSCOPIC LYSIS OF ADHESIONS laparoscopic exploration of abdomen ;  Surgeon: Ardeth Sportsman, MD;  Location: [facility];  Service: General;  Laterality: N/A;   UPPER GASTROINTESTINAL ENDOSCOPY     URETER REVISION     Bilateral "twisted"   UTERINE FIBROID SURGERY     2 SURGERIES FOR FIBROIDS   VENTRAL HERNIA REPAIR  10/03/2011   Procedure: LAPAROSCOPIC VENTRAL HERNIA;  Surgeon: Ardeth Sportsman, MD;  Location: [facility];  Service: General;  Laterality: N/A;   Patient Active Problem List   Diagnosis Date Noted   Poor compliance with CPAP treatment 09/04/2021   Elevated lipase 09/04/2021   Belching 09/04/2021   Gastroesophageal reflux disease 09/04/2021   Hypertensive emergency 09/09/2020   Chest pain    Elevated troponin    Status post coronary artery stent placement    Non-ST elevation (NSTEMI) myocardial infarction (HCC) 09/05/2020   NSTEMI (non-ST elevated myocardial infarction) (HCC) 09/05/2020   Hypersomnia, persistent 09/04/2020   OSA on CPAP 09/04/2020   Shortness of breath 08/18/2020   Allergy to alpha-gal 08/18/2020   Seasonal and perennial allergic rhinitis 08/18/2020   Allergy with anaphylaxis due to food 08/18/2020   Pruritus 08/18/2020   Pollen-food allergy 08/18/2020   Allergic reaction 08/13/2020   Angioedema 08/13/2020   Type 2 macular telangiectasis, bilateral 01/03/2020   Excessive daytime sleepiness 09/12/2019   Sleep paralysis 09/12/2019   Hypnopompic hallucination 09/12/2019   Abnormal dreams 09/12/2019   Diabetic macular edema of right eye with  proliferative retinopathy associated with type 2 diabetes mellitus (HCC) 07/04/2019   Stable treated proliferative diabetic retinopathy of left eye determined by examination associated with type 2 diabetes mellitus (HCC) 07/04/2019   Long-term insulin use (HCC) 04/07/2019   Depression 04/13/2018   History of vertigo 03/22/2018   Laboratory examination 03/22/2018   Chronic diastolic (congestive) heart failure (HCC) 03/22/2018   Other hyperlipidemia 02/15/2018   Vitamin D deficiency 12/31/2017   Class 2 severe obesity with serious comorbidity and body mass index (BMI) of 39.0 to 39.9 in adult (HCC) 11/18/2017   Gait disturbance, post-stroke 04/23/2017   Strain of gluteus medius 04/23/2017   OSA (obstructive sleep apnea) 10/17/2015   Periodic limb movement disorder (PLMD) 10/17/2015   Essential hypertension 05/26/2015   Diabetes mellitus (HCC) 05/26/2015   Abdominal  wall mass of right lower quadrant 05/25/2015   Hemiparesis affecting right side as late effect of stroke (HCC) 11/09/2014   Abdominal wall seroma    Sepsis due to undetermined organism (HCC) 03/06/2014   Nausea vomiting and diarrhea 03/06/2014   Sepsis (HCC) 03/06/2014   Tension headache 09/01/2013   Nausea 03/21/2013   Altered bowel habits 02/28/2013   Spastic hemiplegia affecting dominant side (HCC) 02/16/2013   Recurrent ventral incisional hernia s/p closure/repair w mesh 02/04/2013 01/26/2013   Constipation, chronic 01/26/2013   Abdominal pain, chronic, right lower quadrant 06/03/2012   History of CVA (cerebrovascular accident) 06/03/2012   HTN (hypertension) 06/03/2012   Diabetes mellitus type II, uncontrolled (HCC) 10/16/2011   Obesity (BMI 30-39.9) 09/02/2011   Rotator cuff tear 08/25/2011   Sciatica 06/10/2011   Paresthesias in right hand 06/10/2011   Lumbago 05/09/2011   Paresthesias 05/09/2011    PCP: Irena Reichmann, DO  REFERRING PROVIDER: Erick Colace, MD; Lucia Estelle D, DPM  REFERRING DIAG:  M53.3 (ICD-10-CM) - Sacroiliac dysfunction [phone] (ICD-10-CM) - Mild ankle sprain, left, initial encounter  R60.0 (ICD-10-CM) - Localized edema    Rationale for Evaluation and Treatment: Rehabilitation  THERAPY DIAG:  Other low back pain  Pain in left hip  Pain in right hip  Pain in left ankle and joints of left foot  Muscle weakness (generalized)  Difficulty in walking, not elsewhere classified  Other muscle spasm  ONSET DATE: July 21, 2022 (had a fall and pain started after)  SUBJECTIVE:                                                                                                                                                                                           SUBJECTIVE STATEMENT: Patient presents with spouse today.  Patient reports increased low back pain today and a recent fall in her bathroom after taking codeine.  She reports it makes her groggy and she stumbles.  She denies injury during fall.  Blood glucose - 146 just prior to session  PERTINENT HISTORY:  DM2, HTN, L pontine and basal ganglia CVA, vertigo, NSTEMI  PAIN:  Are you having pain? Yes: NPRS scale: 4/10 Pain location: left low back down into bottom and back of leg to knee Pain description: burning Aggravating factors: walking, cleaning, standing too long, sitting too long Relieving factors: not much, sometimes repositioning or stretching  PRECAUTIONS: Fall and Other: glucose sensor on left arm (day 2 of 14 day sensor); some visual limitation  RED FLAGS: None   WEIGHT BEARING RESTRICTIONS: No  FALLS:  Has patient fallen in last 6 months? Yes. 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  PATIENT SURVEYS:  Modified Oswestry 26/50 = moderate perceived disability   SCREENING FOR RED FLAGS: Bowel or bladder incontinence: No Spinal tumors: No Cauda equina syndrome: No Compression fracture: No Abdominal aneurysm: No  COGNITION: Overall cognitive status: Within functional limits for tasks assessed     SENSATION: Light touch: WFL and pt reports pins/needles down entire front of left LE  POSTURE: rounded shoulders and forward head  PALPATION: Tender over left hip and left PSIS region, diffusely sore over low back region  LUMBAR ROM:   AROM eval  Flexion Patient off balance in CAM boot; assessment deferred  Extension   Right lateral flexion   Left lateral flexion   Right rotation   Left rotation    (Blank rows = not tested)  LOWER EXTREMITY ROM:     Active  Right eval Left eval  Hip flexion Grossly WFL; unable to assess left ankle due to boot  Hip extension   Hip abduction   Hip adduction   Hip internal rotation   Hip external rotation   Knee flexion   Knee extension   Ankle dorsiflexion   Ankle plantarflexion    Ankle inversion    Ankle eversion     (Blank rows = not tested)  LOWER EXTREMITY MMT:    MMT Right eval Left eval  Hip flexion 3+/5 3/5; pain  Hip extension    Hip abduction    Hip adduction    Hip internal rotation    Hip external rotation    Knee flexion    Knee extension 4/5 3+/5; pain  Ankle dorsiflexion 5/5   Ankle plantarflexion    Ankle inversion    Ankle eversion     (Blank rows = not tested)  LUMBAR SPECIAL TESTS:  Stork standing: unable to assess due to pain provoked w/ other testing, SI  Compression/distraction test: Negative; pain/pressure where therapists hands were, repeated x2 w/ same results despite varied hand positioning, FABER test: positive; pinpoint left SIJ pain, and Gaenslen's test: negative; general low back pain, position limited by body habitus  FUNCTIONAL TESTS:  5 times sit to stand: To be assessed.  GAIT: Distance walked: various clinic distances Assistive device utilized: None Level of assistance: Complete Independence Comments: Antalgic, left hip hike secondary to boot on left foot.  This boot may be removed at appt 8/20.  TODAY'S TREATMENT:  DATE: 10/08/2022 -Lumbosacral stability taping for pain management -NuStep x8 minutes using BUE/BLE progressing to level 4.0 for improved gross ROM and strengthening as well as global activity tolerance. -STS w/ LUE support into lumbar extension x10 SBA-CGA -Standing marching x20  Pt reporting pain and RLE weakness with prolonged activity.  2 noted instances of right toe catch following standing and immediate ambulation.  She requires prolonged rest between all tasks.  PATIENT EDUCATION:  Education details:  Continue HEP.  Taping goals and potential benefits - precautions/adhesive allergy.  Open discussion of using cane - pt does not want to pursue this option, but husband is open to trying this in the future.  Person educated: Patient and Spouse Education method: Explanation Education comprehension: verbalized understanding  HOME EXERCISE PROGRAM: Access Code: [email or web address] URL: [email or web address] Date: 09/23/2022 Prepared by: Camille Bal  Exercises - Supine Bridge  - 1 x daily - 4 x weekly - 2 sets - 10 reps - Supine Lower Trunk Rotation  - 1 x daily - 5 x weekly - 2-3 sets - 10 reps - Seated Ankle Alphabet  - 1 x daily - 4-5 x weekly - 1-2 sets - Sit to Stand  Without Arm Support  - 1 x daily - 4 x weekly - 2-3 sets - 10 reps - Heel Raises with Counter Support  - 1 x daily - 4 x weekly - 2-3 sets - 15 reps  ASSESSMENT:  CLINICAL IMPRESSION: Patient was severely limited by ongoing low back pain seeming to more impact the RLE this visit.  She has some instances of noted poor right foot clearance impacting safe gait.  She is not open to using a cane at this time but PT feels it may be appropriate and positively impact her independence compared to having husband help her ambulate by holding onto her arm.  Tried low back stability taping this visit in order to improve pain management.  She was able to tolerate therapeutic exercise bringing the lumbar spine into both flexion and extension in a limited capacity.  Will continue to address general mobility deficits and pain as able.   OBJECTIVE IMPAIRMENTS: Abnormal gait, decreased activity tolerance, decreased balance, decreased endurance, decreased mobility, decreased strength, increased muscle spasms, impaired flexibility, impaired sensation, impaired vision/preception, improper body mechanics, postural dysfunction, obesity, and pain.   ACTIVITY LIMITATIONS: carrying, lifting, bending, standing, squatting, stairs, transfers, bathing, dressing, reach over head, hygiene/grooming, locomotion level, and caring for others  PARTICIPATION LIMITATIONS: meal prep, cleaning, laundry, driving, shopping, community activity, occupation, and yard work  PERSONAL FACTORS: Fitness, Past/current experiences, Sex, Transportation, and 1-2 comorbidities: HTN, DM2 (cannot have cortiosteroid injection for inflammation/pain relief)  are also affecting patient's functional outcome.   REHAB POTENTIAL: Fair see PMH and personal factors  CLINICAL DECISION MAKING: Evolving/moderate complexity  EVALUATION COMPLEXITY: Moderate   GOALS: Goals reviewed with patient? Yes  SHORT TERM GOALS: Target date: 10/03/2022  Pt will be  independent and compliant with introductory strength and stretching HEP in order to maintain functional progress and improve mobility. Baseline:  Patient is intermittently compliant (9/18) Goal status: IN PROGRESS  2.  Patient will report pain of less than or equal to 3/10 at rest in order to demonstrate improved pain management. Baseline: 5/10; 8/10 (9/18) Goal status: NOT MET  3.  Pt will decrease 5xSTS to </=21.03 seconds in order to demonstrate decreased risk for falls and improved functional bilateral LE strength and power. Baseline: 31.03 sec w/ BUE support (8/29); 32.81 sec w/o UE support (9/18)  Goal status: IN PROGRESS  LONG TERM GOALS: Target date: 10/31/2022  Pt will be independent and compliant with advanced strength and stretching HEP in order to maintain functional progress and improve mobility. Baseline:  To be established. Goal status: INITIAL  2.  Pt will decrease 5xSTS to </=11.03 seconds in order to demonstrate decreased risk for falls and improved functional bilateral LE strength and power. Baseline: 31.03 sec w/ BUE support (8/29) Goal status: INITIAL  3.  Patient will ambulate >/=250 feet without increased pain in order to improve functional mobility and ease of household ambulation. Baseline: all activities increased pain 8/19 Goal status: INITIAL  4.  Patient will improve modified Oswestry score to </= 17/50 in order to demonstrate improved subjective functioning and quality of life. Baseline: 26/50 Goal status: INITIAL  5.  Patient will improve LEFS score to >/=26 in order to demonstrate improved subjective functioning and quality of life. Baseline: 17/80 (8/29) Goal status: INITIAL PLAN:  PT FREQUENCY: 1x/week  PT DURATION: 8 weeks  PLANNED INTERVENTIONS: Therapeutic exercises, Therapeutic activity, Neuromuscular re-education, Balance training, Gait training, Patient/Family education, Self Care, Joint mobilization, Stair training, Vestibular training,  DME instructions, Aquatic Therapy, Dry Needling, Electrical stimulation, Spinal mobilization, Moist heat, Taping, Manual therapy, and Re-evaluation.  PLAN FOR NEXT SESSION:  Add to functional strength and stretching HEP.  How did she respond to taping?  TENS prn.  Ankle stability.  Hip strengthening.  STS.  Sadie Haber, PT, DPT 10/08/2022, 5:18 PM

## 2022-10-09 DIAGNOSIS — Z9181 History of falling: Secondary | ICD-10-CM | POA: Diagnosis not present

## 2022-10-09 DIAGNOSIS — E1122 Type 2 diabetes mellitus with diabetic chronic kidney disease: Secondary | ICD-10-CM | POA: Diagnosis not present

## 2022-10-09 DIAGNOSIS — M25641 Stiffness of right hand, not elsewhere classified: Secondary | ICD-10-CM | POA: Diagnosis not present

## 2022-10-09 DIAGNOSIS — F331 Major depressive disorder, recurrent, moderate: Secondary | ICD-10-CM | POA: Diagnosis not present

## 2022-10-09 DIAGNOSIS — I69351 Hemiplegia and hemiparesis following cerebral infarction affecting right dominant side: Secondary | ICD-10-CM | POA: Diagnosis not present

## 2022-10-09 DIAGNOSIS — E785 Hyperlipidemia, unspecified: Secondary | ICD-10-CM | POA: Diagnosis not present

## 2022-10-09 DIAGNOSIS — M25541 Pain in joints of right hand: Secondary | ICD-10-CM | POA: Diagnosis not present

## 2022-10-09 DIAGNOSIS — N1832 Chronic kidney disease, stage 3b: Secondary | ICD-10-CM | POA: Diagnosis not present

## 2022-10-09 DIAGNOSIS — M20011 Mallet finger of right finger(s): Secondary | ICD-10-CM | POA: Diagnosis not present

## 2022-10-09 DIAGNOSIS — M109 Gout, unspecified: Secondary | ICD-10-CM | POA: Diagnosis not present

## 2022-10-09 DIAGNOSIS — I110 Hypertensive heart disease with heart failure: Secondary | ICD-10-CM | POA: Diagnosis not present

## 2022-10-09 DIAGNOSIS — Z6841 Body Mass Index (BMI) 40.0 and over, adult: Secondary | ICD-10-CM | POA: Diagnosis not present

## 2022-10-09 DIAGNOSIS — I5032 Chronic diastolic (congestive) heart failure: Secondary | ICD-10-CM | POA: Diagnosis not present

## 2022-10-15 ENCOUNTER — Ambulatory Visit
Payer: Federal, State, Local not specified - PPO | Attending: Physical Medicine & Rehabilitation | Admitting: Physical Therapy

## 2022-10-15 DIAGNOSIS — T148XXA Other injury of unspecified body region, initial encounter: Secondary | ICD-10-CM | POA: Diagnosis not present

## 2022-10-15 DIAGNOSIS — E1122 Type 2 diabetes mellitus with diabetic chronic kidney disease: Secondary | ICD-10-CM | POA: Diagnosis not present

## 2022-10-16 DIAGNOSIS — M25641 Stiffness of right hand, not elsewhere classified: Secondary | ICD-10-CM | POA: Diagnosis not present

## 2022-10-16 DIAGNOSIS — M20011 Mallet finger of right finger(s): Secondary | ICD-10-CM | POA: Diagnosis not present

## 2022-10-16 DIAGNOSIS — M25541 Pain in joints of right hand: Secondary | ICD-10-CM | POA: Diagnosis not present

## 2022-10-17 ENCOUNTER — Ambulatory Visit
Payer: Federal, State, Local not specified - PPO | Attending: Physical Medicine & Rehabilitation | Admitting: Physical Therapy

## 2022-10-17 ENCOUNTER — Encounter: Payer: Self-pay | Admitting: Physical Therapy

## 2022-10-17 DIAGNOSIS — M25552 Pain in left hip: Secondary | ICD-10-CM | POA: Diagnosis not present

## 2022-10-17 DIAGNOSIS — M25572 Pain in left ankle and joints of left foot: Secondary | ICD-10-CM

## 2022-10-17 DIAGNOSIS — M5459 Other low back pain: Secondary | ICD-10-CM

## 2022-10-17 DIAGNOSIS — M6281 Muscle weakness (generalized): Secondary | ICD-10-CM | POA: Diagnosis not present

## 2022-10-17 DIAGNOSIS — M7989 Other specified soft tissue disorders: Secondary | ICD-10-CM

## 2022-10-17 DIAGNOSIS — R262 Difficulty in walking, not elsewhere classified: Secondary | ICD-10-CM

## 2022-10-17 DIAGNOSIS — M25551 Pain in right hip: Secondary | ICD-10-CM

## 2022-10-17 DIAGNOSIS — M62838 Other muscle spasm: Secondary | ICD-10-CM | POA: Diagnosis not present

## 2022-10-17 NOTE — Therapy (Signed)
OUTPATIENT PHYSICAL THERAPY THORACOLUMBAR TREATMENT   Patient Name: Tina [name] MRN: [id number] DOB:27-May-1970, 52 y.o., female Today's Date: 10/17/2022  END OF SESSION:  PT End of Session - 10/17/22 1536     Visit Number 6    Number of Visits 9   8 + eval   Date for PT Re-Evaluation 11/07/22   pushed out due to scheduling preferences   Authorization Type [employer]    PT Start Time 1531    PT Stop Time 1622   pt in restroom x3 minutes   PT Time Calculation (min) 51 min    Equipment Utilized During Treatment Gait belt    Activity Tolerance Patient limited by pain;Patient tolerated treatment well    Behavior During Therapy [facility] for tasks assessed/performed             Past Medical History:  Diagnosis Date   Allergy    Anemia    DURING MENSES--HAS HEAVY BLEEDING WITH PERODS   Angioedema 08/13/2020   Anxiety    Back pain, chronic    "ongoing"   Blood transfusion    IN 2012  AFTER C -SECTION   Cerebral thrombosis with cerebral infarction (HCC) 06/2009   RIGHT SIDED WEAKNESS ( ARM AND LEG ) AND SPASMS-remains with slight weakness and vertigo.   Constipation    Depression    Diabetes mellitus    Diabetic neuropathy (HCC)    BOTH FEET --COMES AND GOES   Edema, lower extremity    Endometriosis    Fatty liver    GERD (gastroesophageal reflux disease)    with pregnancy   H/O eye surgery    Headache(784.0)    MIGRAINES--NOT REALLY HEADACHE-MORE LIKE PRESSURE SENSATION IN HEAD-FEELS DIZZIY AND  FAINT AS THE PRESSURE RESOLVES   Hernia, incisional, RLQ, s/p lap repair Sep 2013 09/02/2011   History of vertigo 03/22/2018   Hx of migraines 10/19/2011   Hyperlipidemia    Hypertension    Leg pain, right    "like bad Charley horse"   Multiple food allergies    Panic disorder without agoraphobia    Rash    HANDS, ARMS --STATES HX OF RASH EVER SINCE CHILDBIRTH/PREGNANCY.  STATES THE RASH OFTEN OCCURS WHEN SHE IS REALLY STRESSED."goes and comes-presently left  ring finger"   Restless leg syndrome    DIAGNOSED BY SLEEP STUDY - PT TOLD SHE DID NOT HAVE SLEEP APNEA   Right rotator cuff tear    PAIN IN RIGHT SHOULDER   SBO (small bowel obstruction) (HCC) 06/03/2012   Shortness of breath    Spastic hemiplegia affecting dominant side (HCC)    Stomach problems    Stroke (HCC)    Ventral hernia    RIGHT LOWER QUADRANT-CAUSING SOME PAIN   Weakness of right side of body    Past Surgical History:  Procedure Laterality Date   APPLICATION OF WOUND VAC N/A 05/25/2015   Procedure: APPLICATION OF WOUND VAC;  Surgeon: Karie Soda, MD;  Location: [facility];  Service: General;  Laterality: N/A;   CARDIAC CATHETERIZATION     CESAREAN SECTION  2012   COLONOSCOPY     CORONARY STENT INTERVENTION N/A 09/05/2020   Procedure: CORONARY STENT INTERVENTION;  Surgeon: Elder Negus, MD;  Location: [facility];  Service: Cardiovascular;  Laterality: N/A;   CORONARY ULTRASOUND/IVUS N/A 09/05/2020   Procedure: Intravascular Ultrasound/IVUS;  Surgeon: Elder Negus, MD;  Location: [facility];  Service: Cardiovascular;  Laterality: N/A;   DIAGNOSTIC  LAPAROSCOPY     ESOPHAGOGASTRODUODENOSCOPY N/A 06/03/2012   Procedure: ESOPHAGOGASTRODUODENOSCOPY (EGD);  Surgeon: Charna Elizabeth, MD;  Location: [facility] ENDOSCOPY;  Service: Endoscopy;  Laterality: N/A;   EXCISION MASS ABDOMINAL N/A 05/25/2015   Procedure: ABDOMINAL WALL EXPLORATION EXCISION OF SEROMA REMOVAL OF REDUNDANT SKIN ;  Surgeon: Karie Soda, MD;  Location: [facility];  Service: General;  Laterality: N/A;   EYE SURGERY     Eye laser for vessel hemorrhaging   fybroid removal     HERNIA REPAIR  10/03/2011   ventral hernia repair   INSERTION OF MESH N/A 02/04/2013   Procedure: INSERTION OF MESH;  Surgeon: Ardeth Sportsman, MD;  Location: [facility];  Service: General;  Laterality: N/A;   LEFT HEART CATH AND CORONARY ANGIOGRAPHY N/A 09/05/2020   Procedure: LEFT HEART CATH AND CORONARY ANGIOGRAPHY;  Surgeon:  Elder Negus, MD;  Location: [facility];  Service: Cardiovascular;  Laterality: N/A;   PARS PLANA VITRECTOMY Left 08/24/2022   Procedure: PARS PLANA VITRECTOMY 25 GAUGE FOR ENDOPHTHALMITIS; INJECTION OF ANTIBIOTIC;  Surgeon: Carmela Rima, MD;  Location: [facility] OR;  Service: Ophthalmology;  Laterality: Left;   UMBILICAL HERNIA REPAIR N/A 02/04/2013   Procedure: LAPAROSCOPIC ventral wall hernia repair LAPAROSCOPIC LYSIS OF ADHESIONS laparoscopic exploration of abdomen ;  Surgeon: Ardeth Sportsman, MD;  Location: [facility];  Service: General;  Laterality: N/A;   UPPER GASTROINTESTINAL ENDOSCOPY     URETER REVISION     Bilateral "twisted"   UTERINE FIBROID SURGERY     2 SURGERIES FOR FIBROIDS   VENTRAL HERNIA REPAIR  10/03/2011   Procedure: LAPAROSCOPIC VENTRAL HERNIA;  Surgeon: Ardeth Sportsman, MD;  Location: [facility];  Service: General;  Laterality: N/A;   Patient Active Problem List   Diagnosis Date Noted   Poor compliance with CPAP treatment 09/04/2021   Elevated lipase 09/04/2021   Belching 09/04/2021   Gastroesophageal reflux disease 09/04/2021   Hypertensive emergency 09/09/2020   Chest pain    Elevated troponin    Status post coronary artery stent placement    Non-ST elevation (NSTEMI) myocardial infarction (HCC) 09/05/2020   NSTEMI (non-ST elevated myocardial infarction) (HCC) 09/05/2020   Hypersomnia, persistent 09/04/2020   OSA on CPAP 09/04/2020   Shortness of breath 08/18/2020   Allergy to alpha-gal 08/18/2020   Seasonal and perennial allergic rhinitis 08/18/2020   Allergy with anaphylaxis due to food 08/18/2020   Pruritus 08/18/2020   Pollen-food allergy 08/18/2020   Allergic reaction 08/13/2020   Angioedema 08/13/2020   Type 2 macular telangiectasis, bilateral 01/03/2020   Excessive daytime sleepiness 09/12/2019   Sleep paralysis 09/12/2019   Hypnopompic hallucination 09/12/2019   Abnormal dreams 09/12/2019   Diabetic macular edema of right eye with  proliferative retinopathy associated with type 2 diabetes mellitus (HCC) 07/04/2019   Stable treated proliferative diabetic retinopathy of left eye determined by examination associated with type 2 diabetes mellitus (HCC) 07/04/2019   Long-term insulin use (HCC) 04/07/2019   Depression 04/13/2018   History of vertigo 03/22/2018   Laboratory examination 03/22/2018   Chronic diastolic (congestive) heart failure (HCC) 03/22/2018   Other hyperlipidemia 02/15/2018   Vitamin D deficiency 12/31/2017   Class 2 severe obesity with serious comorbidity and body mass index (BMI) of 39.0 to 39.9 in adult (HCC) 11/18/2017   Gait disturbance, post-stroke 04/23/2017   Strain of gluteus medius 04/23/2017   OSA (obstructive sleep apnea) 10/17/2015   Periodic limb movement disorder (PLMD) 10/17/2015   Essential hypertension 05/26/2015   Diabetes mellitus (  HCC) 05/26/2015   Abdominal wall mass of right lower quadrant 05/25/2015   Hemiparesis affecting right side as late effect of stroke (HCC) 11/09/2014   Abdominal wall seroma    Sepsis due to undetermined organism (HCC) 03/06/2014   Nausea vomiting and diarrhea 03/06/2014   Sepsis (HCC) 03/06/2014   Tension headache 09/01/2013   Nausea 03/21/2013   Altered bowel habits 02/28/2013   Spastic hemiplegia affecting dominant side (HCC) 02/16/2013   Recurrent ventral incisional hernia s/p closure/repair w mesh 02/04/2013 01/26/2013   Constipation, chronic 01/26/2013   Abdominal pain, chronic, right lower quadrant 06/03/2012   History of CVA (cerebrovascular accident) 06/03/2012   HTN (hypertension) 06/03/2012   Diabetes mellitus type II, uncontrolled (HCC) 10/16/2011   Obesity (BMI 30-39.9) 09/02/2011   Rotator cuff tear 08/25/2011   Sciatica 06/10/2011   Paresthesias in right hand 06/10/2011   Lumbago 05/09/2011   Paresthesias 05/09/2011    PCP: Irena Reichmann, DO  REFERRING PROVIDER: Erick Colace, MD; Lucia Estelle D, DPM  REFERRING DIAG:  M53.3 (ICD-10-CM) - Sacroiliac dysfunction [phone] (ICD-10-CM) - Mild ankle sprain, left, initial encounter  R60.0 (ICD-10-CM) - Localized edema    Rationale for Evaluation and Treatment: Rehabilitation  THERAPY DIAG:  Other low back pain  Pain in left hip  Pain in right hip  Pain in left ankle and joints of left foot  Muscle weakness (generalized)  Difficulty in walking, not elsewhere classified  Other muscle spasm  Other specified soft tissue disorders  ONSET DATE: July 21, 2022 (had a fall and pain started after)  SUBJECTIVE:                                                                                                                                                                                           SUBJECTIVE STATEMENT: Patient presents alone today.  She reports taping helped more than TENS, but she would not like to do taping again because when it rolled up over time she felt like she was sticking to things.    Blood glucose - 156 just prior to session  PERTINENT HISTORY:  DM2, HTN, L pontine and basal ganglia CVA, vertigo, NSTEMI  PAIN:  Are you having pain? Yes: NPRS scale: 7/10 Pain location: bilateral radiating midline low back pain Pain description: burning, throbbing Aggravating factors: walking, cleaning, standing too long, sitting too long Relieving factors: not much, sometimes repositioning or stretching  PRECAUTIONS: Fall and Other: glucose sensor on left arm (day 2 of 14 day sensor); some visual limitation  RED FLAGS: None   WEIGHT BEARING RESTRICTIONS: No  FALLS:  Has patient fallen in last 6 months? Yes.  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  PATIENT SURVEYS:  Modified Oswestry 26/50 = moderate perceived disability   SCREENING FOR RED FLAGS: Bowel or bladder incontinence: No Spinal tumors: No Cauda equina syndrome: No Compression fracture: No Abdominal aneurysm: No  COGNITION: Overall cognitive status: Within functional limits for tasks assessed     SENSATION: Light touch: WFL and pt reports pins/needles down entire front of left LE  POSTURE: rounded shoulders and forward head  PALPATION: Tender over left hip and left PSIS region, diffusely sore over low back region  LUMBAR ROM:   AROM eval  Flexion Patient off balance in CAM boot; assessment deferred  Extension   Right lateral flexion   Left lateral flexion   Right rotation   Left rotation    (Blank rows = not tested)  LOWER EXTREMITY ROM:     Active  Right eval Left eval  Hip flexion Grossly WFL; unable to assess left ankle due to boot  Hip extension   Hip abduction   Hip adduction   Hip internal rotation   Hip external rotation   Knee flexion   Knee extension   Ankle dorsiflexion   Ankle plantarflexion    Ankle inversion    Ankle eversion     (Blank rows = not tested)  LOWER EXTREMITY MMT:    MMT Right eval Left eval  Hip flexion 3+/5 3/5; pain  Hip extension    Hip abduction    Hip adduction    Hip internal rotation    Hip external rotation    Knee flexion    Knee extension 4/5 3+/5; pain  Ankle dorsiflexion 5/5   Ankle plantarflexion    Ankle inversion    Ankle eversion     (Blank rows = not tested)  LUMBAR SPECIAL TESTS:  Stork standing: unable to assess due to pain provoked w/ other testing, SI  Compression/distraction test: Negative; pain/pressure where therapists hands were, repeated x2 w/ same results despite varied hand positioning, FABER test: positive; pinpoint left SIJ pain, and Gaenslen's test: negative; general low back pain, position limited by body habitus  FUNCTIONAL TESTS:  5 times sit to stand: To be assessed.  GAIT: Distance walked: various clinic distances Assistive device utilized: None Level of assistance: Complete Independence Comments: Antalgic, left hip hike secondary to boot on left foot.  This boot may be removed at appt 8/20.  TODAY'S TREATMENT:  DATE: 10/17/2022 -NuStep x8 minutes using BUE/BLE progressing to level 5.0 for improved gross ROM and strengthening as well as global activity tolerance.  Pt needing to go to bathroom 1 minute into task, returned to task at starting level of 2.0 without issue. -Attempted prone kickbacks w/ pt endorsing pain in low back > modified to side-lying AAROM kickbacks due to ER w/ gravity w/ better pt pain response x20 each LE -Quadruped fire hydrants x15 each LE, used rolled towel under right hand for improved comfort and positioning of thumb -Prone elbow pressups x20  Pt expresses interest in taping again due to fear of pain returning over weekend.  PT tapes bilateral low back and SIJ for stability.  Pt monitored during transitions today due to pain noted and tendency to hold breath, no light-headedness, but PT provided counting and other cues to improve transitions, minimize pain, and prevent light-headedness secondary to holding breath.  PATIENT EDUCATION:  Education details:  Continue HEP.  Goal of taping today and ongoing monitoring for adhesive intolerance and what to do if that happens.   Person educated: Patient Education method: Explanation Education comprehension: verbalized  understanding  HOME EXERCISE PROGRAM: Access Code: [email or web address] URL: [email or web address] Date: 09/23/2022 Prepared by: Camille Bal  Exercises - Supine Bridge  - 1 x daily - 4 x weekly - 2 sets - 10 reps - Supine Lower Trunk Rotation  - 1 x daily - 5 x weekly - 2-3 sets - 10 reps - Seated Ankle Alphabet  - 1 x daily - 4-5 x weekly - 1-2 sets - Sit to Stand Without Arm Support  - 1 x daily - 4 x weekly - 2-3 sets - 10 reps - Heel Raises with Counter Support  - 1 x daily - 4 x weekly - 2-3 sets - 15 reps - Prone Press Up On Elbows  - 1 x daily - 4 x weekly - 3 sets - 10 reps  ASSESSMENT:  CLINICAL IMPRESSION: Patient still somewhat limited by pain, but was better able to participate in progressed therapeutic exercises today.  Made singular addition to HEP to gradually improve thoracolumbar mobility.  She did end up wanting taping again today as she is fearful of spike in pain response following intense activity over the weekend so stability taping was completed at the end of session.  Will continue per POC as pt is making slow but steady progress.  OBJECTIVE IMPAIRMENTS: Abnormal gait, decreased activity tolerance, decreased balance, decreased endurance, decreased mobility, decreased strength, increased muscle spasms, impaired flexibility, impaired sensation, impaired vision/preception, improper body mechanics, postural dysfunction, obesity, and pain.   ACTIVITY LIMITATIONS: carrying, lifting, bending, standing, squatting, stairs, transfers, bathing, dressing, reach over head, hygiene/grooming, locomotion level, and caring for others  PARTICIPATION LIMITATIONS: meal prep, cleaning, laundry, driving, shopping, community activity, occupation, and yard work  PERSONAL FACTORS: Fitness, Past/current experiences, Sex, Transportation, and 1-2 comorbidities: HTN, DM2 (cannot have cortiosteroid injection for inflammation/pain relief)  are also affecting patient's functional outcome.    REHAB POTENTIAL: Fair see PMH and personal factors  CLINICAL DECISION MAKING: Evolving/moderate complexity  EVALUATION COMPLEXITY: Moderate   GOALS: Goals reviewed with patient? Yes  SHORT TERM GOALS: Target date: 10/03/2022  Pt will be independent and compliant with introductory strength and stretching HEP in order to maintain functional progress and improve mobility. Baseline:  Patient is intermittently compliant (9/18) Goal status: IN PROGRESS  2.  Patient will report pain of less than or equal to 3/10 at rest in order to demonstrate improved  pain management. Baseline: 5/10; 8/10 (9/18) Goal status: NOT MET  3.  Pt will decrease 5xSTS to </=21.03 seconds in order to demonstrate decreased risk for falls and improved functional bilateral LE strength and power. Baseline: 31.03 sec w/ BUE support (8/29); 32.81 sec w/o UE support (9/18) Goal status: IN PROGRESS  LONG TERM GOALS: Target date: 10/31/2022  Pt will be independent and compliant with advanced strength and stretching HEP in order to maintain functional progress and improve mobility. Baseline:  To be established. Goal status: INITIAL  2.  Pt will decrease 5xSTS to </=11.03 seconds in order to demonstrate decreased risk for falls and improved functional bilateral LE strength and power. Baseline: 31.03 sec w/ BUE support (8/29) Goal status: INITIAL  3.  Patient will ambulate >/=250 feet without increased pain in order to improve functional mobility and ease of household ambulation. Baseline: all activities increased pain 8/19 Goal status: INITIAL  4.  Patient will improve modified Oswestry score to </= 17/50 in order to demonstrate improved subjective functioning and quality of life. Baseline: 26/50 Goal status: INITIAL  5.  Patient will improve LEFS score to >/=26 in order to demonstrate improved subjective functioning and quality of life. Baseline: 17/80 (8/29) Goal status: INITIAL PLAN:  PT FREQUENCY:  1x/week  PT DURATION: 8 weeks  PLANNED INTERVENTIONS: Therapeutic exercises, Therapeutic activity, Neuromuscular re-education, Balance training, Gait training, Patient/Family education, Self Care, Joint mobilization, Stair training, Vestibular training, DME instructions, Aquatic Therapy, Dry Needling, Electrical stimulation, Spinal mobilization, Moist heat, Taping, Manual therapy, and Re-evaluation.  PLAN FOR NEXT SESSION:  Add to functional strength and stretching HEP.   Ankle stability.  Hip strengthening.  STS.  Gait training w/ cane?  Sadie Haber, PT, DPT 10/17/2022, 4:32 PM

## 2022-10-17 NOTE — Patient Instructions (Signed)
Access Code: [email or web address] URL: [email or web address] Date: 09/23/2022 Prepared by: Camille Bal  Exercises - Supine Bridge  - 1 x daily - 4 x weekly - 2 sets - 10 reps - Supine Lower Trunk Rotation  - 1 x daily - 5 x weekly - 2-3 sets - 10 reps - Seated Ankle Alphabet  - 1 x daily - 4-5 x weekly - 1-2 sets - Sit to Stand Without Arm Support  - 1 x daily - 4 x weekly - 2-3 sets - 10 reps - Heel Raises with Counter Support  - 1 x daily - 4 x weekly - 2-3 sets - 15 reps - Prone Press Up On Elbows  - 1 x daily - 4 x weekly - 3 sets - 10 reps

## 2022-10-22 ENCOUNTER — Ambulatory Visit: Payer: Medicare Other | Admitting: Physical Therapy

## 2022-10-23 ENCOUNTER — Ambulatory Visit: Payer: Federal, State, Local not specified - PPO | Admitting: Physical Therapy

## 2022-10-24 ENCOUNTER — Ambulatory Visit: Payer: Medicare Other | Admitting: Physical Medicine & Rehabilitation

## 2022-10-24 ENCOUNTER — Ambulatory Visit: Payer: Self-pay | Admitting: Physical Medicine & Rehabilitation

## 2022-10-27 DIAGNOSIS — H3582 Retinal ischemia: Secondary | ICD-10-CM | POA: Diagnosis not present

## 2022-10-27 DIAGNOSIS — E113513 Type 2 diabetes mellitus with proliferative diabetic retinopathy with macular edema, bilateral: Secondary | ICD-10-CM | POA: Diagnosis not present

## 2022-10-27 DIAGNOSIS — H31093 Other chorioretinal scars, bilateral: Secondary | ICD-10-CM | POA: Diagnosis not present

## 2022-10-27 DIAGNOSIS — H35372 Puckering of macula, left eye: Secondary | ICD-10-CM | POA: Diagnosis not present

## 2022-10-29 ENCOUNTER — Encounter: Payer: Self-pay | Admitting: Physical Therapy

## 2022-10-29 ENCOUNTER — Ambulatory Visit: Payer: Federal, State, Local not specified - PPO | Admitting: Physical Therapy

## 2022-10-29 DIAGNOSIS — M25551 Pain in right hip: Secondary | ICD-10-CM | POA: Diagnosis not present

## 2022-10-29 DIAGNOSIS — M25572 Pain in left ankle and joints of left foot: Secondary | ICD-10-CM

## 2022-10-29 DIAGNOSIS — M25552 Pain in left hip: Secondary | ICD-10-CM | POA: Diagnosis not present

## 2022-10-29 DIAGNOSIS — R262 Difficulty in walking, not elsewhere classified: Secondary | ICD-10-CM

## 2022-10-29 DIAGNOSIS — M5459 Other low back pain: Secondary | ICD-10-CM

## 2022-10-29 DIAGNOSIS — M62838 Other muscle spasm: Secondary | ICD-10-CM

## 2022-10-29 DIAGNOSIS — M6281 Muscle weakness (generalized): Secondary | ICD-10-CM

## 2022-10-29 DIAGNOSIS — M7989 Other specified soft tissue disorders: Secondary | ICD-10-CM | POA: Diagnosis not present

## 2022-10-29 NOTE — Patient Instructions (Signed)
Access Code: [email or web address] URL: [email or web address] Date: 09/23/2022 Prepared by: Camille Bal  Exercises - Supine Bridge  - 1 x daily - 4 x weekly - 2 sets - 10 reps - Supine Lower Trunk Rotation  - 1 x daily - 5 x weekly - 2-3 sets - 10 reps - Seated Ankle Alphabet  - 1 x daily - 4-5 x weekly - 1-2 sets - Sit to Stand Without Arm Support  - 1 x daily - 4 x weekly - 2-3 sets - 10 reps - Heel Raises with Counter Support  - 1 x daily - 4 x weekly - 2-3 sets - 15 reps - Prone Press Up On Elbows  - 1 x daily - 4 x weekly - 3 sets - 10 reps - Bird Dog on Counter  - 1 x daily - 4 x weekly - 2 sets - 10 reps

## 2022-10-29 NOTE — Therapy (Signed)
OUTPATIENT PHYSICAL THERAPY THORACOLUMBAR TREATMENT   Patient Name: Tina [name] MRN: [id number] DOB:[birth year], 52 y.o., female Today's Date: 10/29/2022  END OF SESSION:  PT End of Session - 10/29/22 1624     Visit Number 7    Number of Visits 9   8 + eval   Date for PT Re-Evaluation 11/07/22   pushed out due to scheduling preferences   Authorization Type [employer]    PT Start Time 1615    PT Stop Time 1700    PT Time Calculation (min) 45 min    Equipment Utilized During Treatment Gait belt    Activity Tolerance Patient limited by pain    Behavior During Therapy WFL for tasks assessed/performed             Past Medical History:  Diagnosis Date   Allergy    Anemia    DURING MENSES--HAS HEAVY BLEEDING WITH PERODS   Angioedema 08/13/2020   Anxiety    Back pain, chronic    "ongoing"   Blood transfusion    IN 2012  AFTER C -SECTION   Cerebral thrombosis with cerebral infarction (HCC) 06/2009   RIGHT SIDED WEAKNESS ( ARM AND LEG ) AND SPASMS-remains with slight weakness and vertigo.   Constipation    Depression    Diabetes mellitus    Diabetic neuropathy (HCC)    BOTH FEET --COMES AND GOES   Edema, lower extremity    Endometriosis    Fatty liver    GERD (gastroesophageal reflux disease)    with pregnancy   H/O eye surgery    Headache(784.0)    MIGRAINES--NOT REALLY HEADACHE-MORE LIKE PRESSURE SENSATION IN HEAD-FEELS DIZZIY AND  FAINT AS THE PRESSURE RESOLVES   Hernia, incisional, RLQ, s/p lap repair Sep 2013 09/02/2011   History of vertigo 03/22/2018   Hx of migraines 10/19/2011   Hyperlipidemia    Hypertension    Leg pain, right    "like bad Charley horse"   Multiple food allergies    Panic disorder without agoraphobia    Rash    HANDS, ARMS --STATES HX OF RASH EVER SINCE CHILDBIRTH/PREGNANCY.  STATES THE RASH OFTEN OCCURS WHEN SHE IS REALLY STRESSED."goes and comes-presently left ring finger"   Restless leg syndrome    DIAGNOSED BY  SLEEP STUDY - PT TOLD SHE DID NOT HAVE SLEEP APNEA   Right rotator cuff tear    PAIN IN RIGHT SHOULDER   SBO (small bowel obstruction) (HCC) 06/03/2012   Shortness of breath    Spastic hemiplegia affecting dominant side (HCC)    Stomach problems    Stroke (HCC)    Ventral hernia    RIGHT LOWER QUADRANT-CAUSING SOME PAIN   Weakness of right side of body    Past Surgical History:  Procedure Laterality Date   APPLICATION OF WOUND VAC N/A 05/25/2015   Procedure: APPLICATION OF WOUND VAC;  Surgeon: Karie Soda, MD;  Location: [facility];  Service: General;  Laterality: N/A;   CARDIAC CATHETERIZATION     CESAREAN SECTION  2012   COLONOSCOPY     CORONARY STENT INTERVENTION N/A 09/05/2020   Procedure: CORONARY STENT INTERVENTION;  Surgeon: Elder Negus, MD;  Location: [facility];  Service: Cardiovascular;  Laterality: N/A;   CORONARY ULTRASOUND/IVUS N/A 09/05/2020   Procedure: Intravascular Ultrasound/IVUS;  Surgeon: Elder Negus, MD;  Location: [facility];  Service: Cardiovascular;  Laterality: N/A;   DIAGNOSTIC LAPAROSCOPY     ESOPHAGOGASTRODUODENOSCOPY N/A 06/03/2012  Procedure: ESOPHAGOGASTRODUODENOSCOPY (EGD);  Surgeon: Charna Elizabeth, MD;  Location: [facility];  Service: Endoscopy;  Laterality: N/A;   EXCISION MASS ABDOMINAL N/A 05/25/2015   Procedure: ABDOMINAL WALL EXPLORATION EXCISION OF SEROMA REMOVAL OF REDUNDANT SKIN ;  Surgeon: Karie Soda, MD;  Location: [facility];  Service: General;  Laterality: N/A;   EYE SURGERY     Eye laser for vessel hemorrhaging   fybroid removal     HERNIA REPAIR  10/03/2011   ventral hernia repair   INSERTION OF MESH N/A 02/04/2013   Procedure: INSERTION OF MESH;  Surgeon: Ardeth Sportsman, MD;  Location: [facility];  Service: General;  Laterality: N/A;   LEFT HEART CATH AND CORONARY ANGIOGRAPHY N/A 09/05/2020   Procedure: LEFT HEART CATH AND CORONARY ANGIOGRAPHY;  Surgeon: Elder Negus, MD;  Location: [facility];  Service: Cardiovascular;  Laterality: N/A;   PARS PLANA VITRECTOMY Left 08/24/2022   Procedure: PARS PLANA VITRECTOMY 25 GAUGE FOR ENDOPHTHALMITIS; INJECTION OF ANTIBIOTIC;  Surgeon: Carmela Rima, MD;  Location: [facility];  Service: Ophthalmology;  Laterality: Left;   UMBILICAL HERNIA REPAIR N/A 02/04/2013   Procedure: LAPAROSCOPIC ventral wall hernia repair LAPAROSCOPIC LYSIS OF ADHESIONS laparoscopic exploration of abdomen ;  Surgeon: Ardeth Sportsman, MD;  Location: [facility];  Service: General;  Laterality: N/A;   UPPER GASTROINTESTINAL ENDOSCOPY     URETER REVISION     Bilateral "twisted"   UTERINE FIBROID SURGERY     2 SURGERIES FOR FIBROIDS   VENTRAL HERNIA REPAIR  10/03/2011   Procedure: LAPAROSCOPIC VENTRAL HERNIA;  Surgeon: Ardeth Sportsman, MD;  Location: [facility];  Service: General;  Laterality: N/A;   Patient Active Problem List   Diagnosis Date Noted   Poor compliance with CPAP treatment 09/04/2021   Elevated lipase 09/04/2021   Belching 09/04/2021   Gastroesophageal reflux disease 09/04/2021   Hypertensive emergency 09/09/2020   Chest pain    Elevated troponin    Status post coronary artery stent placement    Non-ST elevation (NSTEMI) myocardial infarction (HCC) 09/05/2020   NSTEMI (non-ST elevated myocardial infarction) (HCC) 09/05/2020   Hypersomnia, persistent 09/04/2020   OSA on CPAP 09/04/2020   Shortness of breath 08/18/2020   Allergy to alpha-gal 08/18/2020   Seasonal and perennial allergic rhinitis 08/18/2020   Allergy with anaphylaxis due to food 08/18/2020   Pruritus 08/18/2020   Pollen-food allergy 08/18/2020   Allergic reaction 08/13/2020   Angioedema 08/13/2020   Type 2 macular telangiectasis, bilateral 01/03/2020   Excessive daytime sleepiness 09/12/2019   Sleep paralysis 09/12/2019   Hypnopompic hallucination 09/12/2019   Abnormal dreams 09/12/2019   Diabetic macular edema of right eye with proliferative retinopathy associated with type 2 diabetes  mellitus (HCC) 07/04/2019   Stable treated proliferative diabetic retinopathy of left eye determined by examination associated with type 2 diabetes mellitus (HCC) 07/04/2019   Long-term insulin use (HCC) 04/07/2019   Depression 04/13/2018   History of vertigo 03/22/2018   Laboratory examination 03/22/2018   Chronic diastolic (congestive) heart failure (HCC) 03/22/2018   Other hyperlipidemia 02/15/2018   Vitamin D deficiency 12/31/2017   Class 2 severe obesity with serious comorbidity and body mass index (BMI) of 39.0 to 39.9 in adult (HCC) 11/18/2017   Gait disturbance, post-stroke 04/23/2017   Strain of gluteus medius 04/23/2017   OSA (obstructive sleep apnea) 10/17/2015   Periodic limb movement disorder (PLMD) 10/17/2015   Essential hypertension 05/26/2015   Diabetes mellitus (HCC) 05/26/2015   Abdominal wall mass of right lower  quadrant 05/25/2015   Hemiparesis affecting right side as late effect of stroke (HCC) 11/09/2014   Abdominal wall seroma    Sepsis due to undetermined organism (HCC) 03/06/2014   Nausea vomiting and diarrhea 03/06/2014   Sepsis (HCC) 03/06/2014   Tension headache 09/01/2013   Nausea 03/21/2013   Altered bowel habits 02/28/2013   Spastic hemiplegia affecting dominant side (HCC) 02/16/2013   Recurrent ventral incisional hernia s/p closure/repair w mesh 02/04/2013 01/26/2013   Constipation, chronic 01/26/2013   Abdominal pain, chronic, right lower quadrant 06/03/2012   History of CVA (cerebrovascular accident) 06/03/2012   HTN (hypertension) 06/03/2012   Diabetes mellitus type II, uncontrolled (HCC) 10/16/2011   Obesity (BMI 30-39.9) 09/02/2011   Rotator cuff tear 08/25/2011   Sciatica 06/10/2011   Paresthesias in right hand 06/10/2011   Lumbago 05/09/2011   Paresthesias 05/09/2011    PCP: Irena Reichmann, DO  REFERRING PROVIDER: Erick Colace, MD; Lucia Estelle D, DPM  REFERRING DIAG: M53.3 (ICD-10-CM) - Sacroiliac dysfunction [phone]  (ICD-10-CM) - Mild ankle sprain, left, initial encounter  R60.0 (ICD-10-CM) - Localized edema    Rationale for Evaluation and Treatment: Rehabilitation  THERAPY DIAG:  Other low back pain  Pain in left hip  Pain in right hip  Pain in left ankle and joints of left foot  Muscle weakness (generalized)  Difficulty in walking, not elsewhere classified  Other muscle spasm  ONSET DATE: July 21, 2022 (had a fall and pain started after)  SUBJECTIVE:                                                                                                                                                                                           SUBJECTIVE STATEMENT: Patient presents alone today.  She states she feels she is getting sick and can always tell because her teeth start hurting.  She reports that she is to have a shot for her low back pain in a couple weeks.  She states the tape helped some last time.  Blood glucose - 207 just prior to session  PERTINENT HISTORY:  DM2, HTN, L pontine and basal ganglia CVA, vertigo, NSTEMI  PAIN:  Are you having pain? Yes: NPRS scale: 8/10 Pain location: bilateral radiating midline low back pain Pain description: burning Aggravating factors: walking, cleaning, standing too long, sitting too long Relieving factors: not much, sometimes repositioning or stretching  PRECAUTIONS: Fall and Other: glucose sensor on left arm (day 2 of 14 day sensor); some visual limitation  RED FLAGS: None   WEIGHT BEARING RESTRICTIONS: No  FALLS:  Has patient fallen in last 6 months? Yes. 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  PATIENT SURVEYS:  Modified Oswestry 26/50 = moderate perceived disability   SCREENING FOR RED FLAGS: Bowel or bladder incontinence: No Spinal tumors: No Cauda equina syndrome: No Compression fracture: No Abdominal aneurysm: No  COGNITION: Overall cognitive status: Within functional limits for tasks assessed     SENSATION: Light touch: WFL and pt reports pins/needles down entire front of left LE  POSTURE: rounded shoulders and forward head  PALPATION: Tender over left hip and left PSIS region, diffusely sore over low back region  LUMBAR ROM:   AROM eval  Flexion Patient off balance in CAM boot; assessment deferred  Extension   Right lateral flexion   Left lateral flexion   Right rotation   Left rotation    (Blank rows = not tested)  LOWER EXTREMITY ROM:     Active  Right eval Left eval  Hip flexion Grossly WFL; unable to assess left ankle due to boot  Hip extension   Hip abduction   Hip adduction   Hip internal rotation   Hip external rotation   Knee flexion   Knee extension   Ankle dorsiflexion   Ankle plantarflexion    Ankle inversion    Ankle eversion     (Blank rows = not tested)  LOWER EXTREMITY MMT:    MMT Right eval Left eval  Hip flexion 3+/5 3/5; pain  Hip extension    Hip abduction    Hip adduction    Hip internal rotation    Hip external rotation    Knee flexion    Knee extension 4/5 3+/5; pain  Ankle dorsiflexion 5/5   Ankle plantarflexion    Ankle inversion    Ankle eversion     (Blank rows = not tested)  LUMBAR SPECIAL TESTS:  Stork standing: unable to assess due to pain provoked w/ other testing, SI Compression/distraction test: Negative; pain/pressure  where therapists hands were, repeated x2 w/ same results despite varied hand positioning, FABER test: positive; pinpoint left SIJ pain, and Gaenslen's test: negative; general low back pain, position limited by body habitus  FUNCTIONAL TESTS:  5 times sit to stand: To be assessed.  GAIT: Distance walked: various clinic distances Assistive device utilized: None Level of assistance: Complete Independence Comments: Antalgic, left hip hike secondary to boot on left foot.  This boot may be removed at appt 8/20.  TODAY'S TREATMENT:  DATE: 10/29/2022 RAMP:  Level of Assistance: SBA and CGA Assistive device utilized: Single point cane Ramp Comments: Pt is fearful of descent demonstrating cautious stepping, education on step size and cane approximation, performed x3 to progress to SBA.  Pt stops on unlevel portion between ramp and platform causing single posterior LOB requiring minA to correct.  CURB:  Level of Assistance: SBA and CGA Assistive device utilized: Single point cane Curb Comments: Performed twice w/ cues for sequencing of cane and step, pt more stable when descending with RLE first.  STAIRS:  Level of Assistance: Modified independence  Stair Negotiation Technique: Step to Pattern Forwards With use of AD: SPC  with Single Rail on Left  Number of Stairs: 4   Height of Stairs: 6"  Comments: Edu on sequencing and variations due to fluctuations in LE strength.  GAIT: Gait pattern:  intermittent ER of LLE, step through pattern, decreased arm swing- Right, and decreased hip/knee flexion- Right Distance walked: 200 ft Assistive device utilized: Single point cane Level of assistance: Modified independence and SBA Comments: Pt kicks SPC twice due to LLE ER during swing - educated pt on fall risk associated with this deficit and continued practice at home on  bringing toe in towards midline.  Pt fatigued and with increased low back pain following distance so returned to sitting until return to baseline symptoms.  She has good sequencing after initial steps.  -Seated BAPS board for ankle mobility CCW circles x1 minute > CW circles x1 minute, pt endorses pain with activity -Staggered STS using BUE support x10 each LE in rear, pt is severely fatigued following initial round, cues to improve standing vertical -Standing birddogs w/ contralateral UE support x 10 each side, PT limits pelvic rotation with hip extension, pt reports severe right sided low back pain with repetition, does return to baseline with sitting  PATIENT EDUCATION:  Education details:  Continue HEP.   Plan for discharge next session and rationale. Education on benefit of cane on days of more weakness/pain causing instability particularly with challenging compliant surfaces. Person educated: Patient Education method: Explanation Education comprehension: verbalized understanding  HOME EXERCISE PROGRAM: Access Code: [email or web address] URL: [email or web address] Date: 09/23/2022 Prepared by: Camille Bal  Exercises - Supine Bridge  - 1 x daily - 4 x weekly - 2 sets - 10 reps - Supine Lower Trunk Rotation  - 1 x daily - 5 x weekly - 2-3 sets - 10 reps - Seated Ankle Alphabet  - 1 x daily - 4-5 x weekly - 1-2 sets - Sit to Stand Without Arm Support  - 1 x daily - 4 x weekly - 2-3 sets - 10 reps - Heel Raises with Counter Support  - 1 x daily - 4 x weekly - 2-3 sets - 15 reps - Prone Press Up On Elbows  - 1 x daily - 4 x weekly - 3 sets - 10 reps - Bird Dog on Counter  - 1 x daily - 4 x weekly - 2 sets - 10 reps  ASSESSMENT:  CLINICAL IMPRESSION: Patient remains significantly limited by pain.  PT has been unable to effectively manage her pain and progress her strength and balance due to lingering high levels of chronic low back pain.  PT did address use of SPC when patient is  more unstable.  She prefers to go without AD, but verbalized understanding of intermittent recommendation.  She has increased pain with all therapeutic exercises performed today so only single modification made to HEP.  PT is planning to discharge patient at next visit as it is end of current POC and progress has been modest.   OBJECTIVE IMPAIRMENTS: Abnormal gait, decreased activity tolerance, decreased balance, decreased endurance, decreased mobility, decreased strength, increased muscle spasms, impaired flexibility, impaired sensation, impaired vision/preception, improper body mechanics, postural dysfunction, obesity, and pain.   ACTIVITY LIMITATIONS: carrying, lifting, bending, standing, squatting, stairs, transfers, bathing, dressing, reach over head, hygiene/grooming, locomotion level, and caring for others  PARTICIPATION LIMITATIONS: meal prep, cleaning, laundry, driving, shopping, community activity, occupation, and yard work  PERSONAL FACTORS: Fitness, Past/current experiences, Sex, Transportation, and 1-2 comorbidities: HTN, DM2 (cannot have cortiosteroid injection for inflammation/pain relief)  are also affecting patient's functional outcome.   REHAB POTENTIAL: Fair see PMH and personal factors  CLINICAL DECISION MAKING: Evolving/moderate complexity  EVALUATION COMPLEXITY: Moderate   GOALS: Goals reviewed with patient? Yes  SHORT TERM GOALS: Target date: 10/03/2022  Pt will be independent and compliant with introductory strength and stretching HEP in order to maintain functional progress and improve mobility. Baseline:  Patient is intermittently compliant (9/18) Goal status: IN PROGRESS  2.  Patient will report pain of less than or equal to 3/10 at rest in order to demonstrate improved pain management. Baseline: 5/10; 8/10 (9/18) Goal status: NOT MET  3.  Pt will decrease 5xSTS to </=21.03 seconds in order to demonstrate decreased risk for falls and improved functional  bilateral LE strength and power. Baseline: 31.03 sec w/ BUE support (8/29); 32.81 sec w/o UE support (9/18) Goal status: IN PROGRESS  LONG TERM GOALS: Target date: 10/31/2022  Pt will be independent and compliant with advanced strength and stretching HEP in order to maintain functional progress and improve mobility. Baseline:  To be established. Goal status: INITIAL  2.  Pt will decrease 5xSTS to </=11.03 seconds in order to demonstrate decreased risk for falls and improved functional bilateral LE strength and power. Baseline: 31.03 sec w/ BUE support (8/29) Goal status: INITIAL  3.  Patient will ambulate >/=250 feet without increased pain in order to improve functional mobility and ease of household ambulation. Baseline: all activities increased pain 8/19 Goal status: INITIAL  4.  Patient will improve modified Oswestry score to </= 17/50 in order to demonstrate improved subjective functioning and quality of life. Baseline: 26/50 Goal status: INITIAL  5.  Patient will improve LEFS score to >/=26 in order to demonstrate improved subjective functioning and quality of life. Baseline: 17/80 (8/29) Goal status: INITIAL PLAN:  PT FREQUENCY: 1x/week  PT DURATION: 8 weeks  PLANNED INTERVENTIONS: Therapeutic exercises, Therapeutic activity, Neuromuscular re-education, Balance training, Gait training, Patient/Family education, Self Care, Joint mobilization, Stair training, Vestibular training, DME instructions, Aquatic Therapy, Dry Needling, Electrical stimulation, Spinal mobilization, Moist heat, Taping, Manual therapy, and Re-evaluation.  PLAN FOR NEXT SESSION:  ASSESS LTGs - D/C!  Sadie Haber, PT, DPT 10/29/2022, 5:05 PM

## 2022-10-30 DIAGNOSIS — M25541 Pain in joints of right hand: Secondary | ICD-10-CM | POA: Diagnosis not present

## 2022-10-30 DIAGNOSIS — M20011 Mallet finger of right finger(s): Secondary | ICD-10-CM | POA: Diagnosis not present

## 2022-10-30 DIAGNOSIS — M25641 Stiffness of right hand, not elsewhere classified: Secondary | ICD-10-CM | POA: Diagnosis not present

## 2022-11-03 DIAGNOSIS — E113592 Type 2 diabetes mellitus with proliferative diabetic retinopathy without macular edema, left eye: Secondary | ICD-10-CM | POA: Diagnosis not present

## 2022-11-03 DIAGNOSIS — E1169 Type 2 diabetes mellitus with other specified complication: Secondary | ICD-10-CM | POA: Diagnosis not present

## 2022-11-03 DIAGNOSIS — E1122 Type 2 diabetes mellitus with diabetic chronic kidney disease: Secondary | ICD-10-CM | POA: Diagnosis not present

## 2022-11-03 DIAGNOSIS — E1165 Type 2 diabetes mellitus with hyperglycemia: Secondary | ICD-10-CM | POA: Diagnosis not present

## 2022-11-05 ENCOUNTER — Encounter: Payer: Self-pay | Admitting: Physical Therapy

## 2022-11-05 ENCOUNTER — Ambulatory Visit: Payer: Federal, State, Local not specified - PPO | Admitting: Physical Therapy

## 2022-11-05 DIAGNOSIS — M62838 Other muscle spasm: Secondary | ICD-10-CM

## 2022-11-05 DIAGNOSIS — M25551 Pain in right hip: Secondary | ICD-10-CM

## 2022-11-05 DIAGNOSIS — M5459 Other low back pain: Secondary | ICD-10-CM

## 2022-11-05 DIAGNOSIS — M25572 Pain in left ankle and joints of left foot: Secondary | ICD-10-CM | POA: Diagnosis not present

## 2022-11-05 DIAGNOSIS — M25552 Pain in left hip: Secondary | ICD-10-CM | POA: Diagnosis not present

## 2022-11-05 DIAGNOSIS — M7989 Other specified soft tissue disorders: Secondary | ICD-10-CM | POA: Diagnosis not present

## 2022-11-05 DIAGNOSIS — M6281 Muscle weakness (generalized): Secondary | ICD-10-CM | POA: Diagnosis not present

## 2022-11-05 DIAGNOSIS — R262 Difficulty in walking, not elsewhere classified: Secondary | ICD-10-CM | POA: Diagnosis not present

## 2022-11-05 NOTE — Therapy (Signed)
OUTPATIENT PHYSICAL THERAPY THORACOLUMBAR TREATMENT - DISCHARGE SUMMARY   Patient Name: NIAH SARNOWSKI MRN: [id number] DOB:01/19/1970, 52 y.o., female Today's Date: 11/05/2022  PHYSICAL THERAPY DISCHARGE SUMMARY  Visits from Start of Care: 8  Current functional level related to goals / functional outcomes: See clinical impression statement.   Remaining deficits: Intermittent pain and gait deficits secondary to pain   Education / Equipment: Continue HEP.   Discharge plan for today and progress towards goals.  Ongoing stress management as it seems recent methods for this may have helped improve her pain.   Patient agrees to discharge. Patient goals were partially met. Patient is being discharged due to being pleased with the current functional level.   END OF SESSION:  PT End of Session - 11/05/22 1627     Visit Number 8    Number of Visits 9   8 + eval   Date for PT Re-Evaluation 11/07/22   pushed out due to scheduling preferences   Authorization Type [employer]    PT Start Time 1621   PT w/ pt prior   PT Stop Time 1659    PT Time Calculation (min) 38 min    Equipment Utilized During Treatment Gait belt    Activity Tolerance Patient limited by pain    Behavior During Therapy WFL for tasks assessed/performed             Past Medical History:  Diagnosis Date   Allergy    Anemia    DURING MENSES--HAS HEAVY BLEEDING WITH PERODS   Angioedema 08/13/2020   Anxiety    Back pain, chronic    "ongoing"   Blood transfusion    IN 2012  AFTER C -SECTION   Cerebral thrombosis with cerebral infarction (HCC) 06/2009   RIGHT SIDED WEAKNESS ( ARM AND LEG ) AND SPASMS-remains with slight weakness and vertigo.   Constipation    Depression    Diabetes mellitus    Diabetic neuropathy (HCC)    BOTH FEET --COMES AND GOES   Edema, lower extremity    Endometriosis    Fatty liver    GERD (gastroesophageal reflux disease)    with pregnancy   H/O eye surgery     Headache(784.0)    MIGRAINES--NOT REALLY HEADACHE-MORE LIKE PRESSURE SENSATION IN HEAD-FEELS DIZZIY AND  FAINT AS THE PRESSURE RESOLVES   Hernia, incisional, RLQ, s/p lap repair Sep 2013 09/02/2011   History of vertigo 03/22/2018   Hx of migraines 10/19/2011   Hyperlipidemia    Hypertension    Leg pain, right    "like bad Charley horse"   Multiple food allergies    Panic disorder without agoraphobia    Rash    HANDS, ARMS --STATES HX OF RASH EVER SINCE CHILDBIRTH/PREGNANCY.  STATES THE RASH OFTEN OCCURS WHEN SHE IS REALLY STRESSED."goes and comes-presently left ring finger"   Restless leg syndrome    DIAGNOSED BY SLEEP STUDY - PT TOLD SHE DID NOT HAVE SLEEP APNEA   Right rotator cuff tear    PAIN IN RIGHT SHOULDER   SBO (small bowel obstruction) (HCC) 06/03/2012   Shortness of breath    Spastic hemiplegia affecting dominant side (HCC)    Stomach problems    Stroke (HCC)    Ventral hernia    RIGHT LOWER QUADRANT-CAUSING SOME PAIN   Weakness of right side of body    Past Surgical History:  Procedure Laterality Date   APPLICATION OF WOUND VAC N/A 05/25/2015   Procedure: APPLICATION OF WOUND  VAC;  Surgeon: Karie Soda, MD;  Location: [facility];  Service: General;  Laterality: N/A;   CARDIAC CATHETERIZATION     CESAREAN SECTION  2012   COLONOSCOPY     CORONARY STENT INTERVENTION N/A 09/05/2020   Procedure: CORONARY STENT INTERVENTION;  Surgeon: Elder Negus, MD;  Location: [facility];  Service: Cardiovascular;  Laterality: N/A;   CORONARY ULTRASOUND/IVUS N/A 09/05/2020   Procedure: Intravascular Ultrasound/IVUS;  Surgeon: Elder Negus, MD;  Location: [facility];  Service: Cardiovascular;  Laterality: N/A;   DIAGNOSTIC LAPAROSCOPY     ESOPHAGOGASTRODUODENOSCOPY N/A 06/03/2012   Procedure: ESOPHAGOGASTRODUODENOSCOPY (EGD);  Surgeon: Charna Elizabeth, MD;  Location: [facility];  Service: Endoscopy;  Laterality: N/A;   EXCISION MASS ABDOMINAL N/A 05/25/2015    Procedure: ABDOMINAL WALL EXPLORATION EXCISION OF SEROMA REMOVAL OF REDUNDANT SKIN ;  Surgeon: Karie Soda, MD;  Location: [facility];  Service: General;  Laterality: N/A;   EYE SURGERY     Eye laser for vessel hemorrhaging   fybroid removal     HERNIA REPAIR  10/03/2011   ventral hernia repair   INSERTION OF MESH N/A 02/04/2013   Procedure: INSERTION OF MESH;  Surgeon: Ardeth Sportsman, MD;  Location: [facility];  Service: General;  Laterality: N/A;   LEFT HEART CATH AND CORONARY ANGIOGRAPHY N/A 09/05/2020   Procedure: LEFT HEART CATH AND CORONARY ANGIOGRAPHY;  Surgeon: Elder Negus, MD;  Location: [facility];  Service: Cardiovascular;  Laterality: N/A;   PARS PLANA VITRECTOMY Left 08/24/2022   Procedure: PARS PLANA VITRECTOMY 25 GAUGE FOR ENDOPHTHALMITIS; INJECTION OF ANTIBIOTIC;  Surgeon: Carmela Rima, MD;  Location: [facility] OR;  Service: Ophthalmology;  Laterality: Left;   UMBILICAL HERNIA REPAIR N/A 02/04/2013   Procedure: LAPAROSCOPIC ventral wall hernia repair LAPAROSCOPIC LYSIS OF ADHESIONS laparoscopic exploration of abdomen ;  Surgeon: Ardeth Sportsman, MD;  Location: [facility];  Service: General;  Laterality: N/A;   UPPER GASTROINTESTINAL ENDOSCOPY     URETER REVISION     Bilateral "twisted"   UTERINE FIBROID SURGERY     2 SURGERIES FOR FIBROIDS   VENTRAL HERNIA REPAIR  10/03/2011   Procedure: LAPAROSCOPIC VENTRAL HERNIA;  Surgeon: Ardeth Sportsman, MD;  Location: [facility];  Service: General;  Laterality: N/A;   Patient Active Problem List   Diagnosis Date Noted   Poor compliance with CPAP treatment 09/04/2021   Elevated lipase 09/04/2021   Belching 09/04/2021   Gastroesophageal reflux disease 09/04/2021   Hypertensive emergency 09/09/2020   Chest pain    Elevated troponin    Status post coronary artery stent placement    Non-ST elevation (NSTEMI) myocardial infarction (HCC) 09/05/2020   NSTEMI (non-ST elevated myocardial infarction) (HCC) 09/05/2020   Hypersomnia,  persistent 09/04/2020   OSA on CPAP 09/04/2020   Shortness of breath 08/18/2020   Allergy to alpha-gal 08/18/2020   Seasonal and perennial allergic rhinitis 08/18/2020   Allergy with anaphylaxis due to food 08/18/2020   Pruritus 08/18/2020   Pollen-food allergy 08/18/2020   Allergic reaction 08/13/2020   Angioedema 08/13/2020   Type 2 macular telangiectasis, bilateral 01/03/2020   Excessive daytime sleepiness 09/12/2019   Sleep paralysis 09/12/2019   Hypnopompic hallucination 09/12/2019   Abnormal dreams 09/12/2019   Diabetic macular edema of right eye with proliferative retinopathy associated with type 2 diabetes mellitus (HCC) 07/04/2019   Stable treated proliferative diabetic retinopathy of left eye determined by examination associated with type 2 diabetes mellitus (HCC) 07/04/2019   Long-term insulin use (  HCC) 04/07/2019   Depression 04/13/2018   History of vertigo 03/22/2018   Laboratory examination 03/22/2018   Chronic diastolic (congestive) heart failure (HCC) 03/22/2018   Other hyperlipidemia 02/15/2018   Vitamin D deficiency 12/31/2017   Class 2 severe obesity with serious comorbidity and body mass index (BMI) of 39.0 to 39.9 in adult (HCC) 11/18/2017   Gait disturbance, post-stroke 04/23/2017   Strain of gluteus medius 04/23/2017   OSA (obstructive sleep apnea) 10/17/2015   Periodic limb movement disorder (PLMD) 10/17/2015   Essential hypertension 05/26/2015   Diabetes mellitus (HCC) 05/26/2015   Abdominal wall mass of right lower quadrant 05/25/2015   Hemiparesis affecting right side as late effect of stroke (HCC) 11/09/2014   Abdominal wall seroma    Sepsis due to undetermined organism (HCC) 03/06/2014   Nausea vomiting and diarrhea 03/06/2014   Sepsis (HCC) 03/06/2014   Tension headache 09/01/2013   Nausea 03/21/2013   Altered bowel habits 02/28/2013   Spastic hemiplegia affecting dominant side (HCC) 02/16/2013   Recurrent ventral incisional hernia s/p  closure/repair w mesh 02/04/2013 01/26/2013   Constipation, chronic 01/26/2013   Abdominal pain, chronic, right lower quadrant 06/03/2012   History of CVA (cerebrovascular accident) 06/03/2012   HTN (hypertension) 06/03/2012   Diabetes mellitus type II, uncontrolled (HCC) 10/16/2011   Obesity (BMI 30-39.9) 09/02/2011   Rotator cuff tear 08/25/2011   Sciatica 06/10/2011   Paresthesias in right hand 06/10/2011   Lumbago 05/09/2011   Paresthesias 05/09/2011    PCP: Irena Reichmann, DO  REFERRING PROVIDER: Erick Colace, MD; Lucia Estelle D, DPM  REFERRING DIAG: M53.3 (ICD-10-CM) - Sacroiliac dysfunction [phone] (ICD-10-CM) - Mild ankle sprain, left, initial encounter  R60.0 (ICD-10-CM) - Localized edema    Rationale for Evaluation and Treatment: Rehabilitation  THERAPY DIAG:  Other low back pain  Pain in left hip  Pain in right hip  Pain in left ankle and joints of left foot  Muscle weakness (generalized)  Difficulty in walking, not elsewhere classified  Other muscle spasm  Other specified soft tissue disorders  ONSET DATE: July 21, 2022 (had a fall and pain started after)  SUBJECTIVE:                                                                                                                                                                                           SUBJECTIVE STATEMENT: Patient presents alone today.  She denies falls or acute changes.  She had a massage and soaked in a hot tub and this has tremendously helped her pain.  She has been doing her HEP.  Blood glucose - her sensor is not on today,  she does not have her new shipment.  PERTINENT HISTORY:  DM2, HTN, L pontine and basal ganglia CVA, vertigo, NSTEMI  PAIN:  Are you having pain? No - pt states she is "really comfortable" today  PRECAUTIONS: Fall and Other: glucose sensor on left arm (day 2 of 14 day sensor); some visual limitation  RED FLAGS: None   WEIGHT BEARING RESTRICTIONS:  No  FALLS:  Has patient fallen in last 6 months? Yes. 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  PATIENT SURVEYS:  Modified Oswestry 26/50 = moderate perceived disability   SCREENING FOR RED FLAGS: Bowel or bladder incontinence: No Spinal tumors: No Cauda equina syndrome: No Compression fracture: No Abdominal aneurysm: No  COGNITION: Overall cognitive status: Within functional limits for tasks assessed     SENSATION: Light touch: WFL and pt reports pins/needles down entire front of left LE  POSTURE: rounded shoulders and forward head  PALPATION: Tender over left hip and left PSIS region, diffusely sore over low back region  LUMBAR ROM:   AROM eval  Flexion Patient off balance in CAM boot; assessment deferred  Extension   Right lateral flexion   Left lateral flexion   Right rotation   Left rotation    (Blank rows = not tested)  LOWER EXTREMITY ROM:     Active  Right eval Left eval  Hip flexion Grossly WFL; unable to assess left ankle due to boot  Hip extension   Hip abduction   Hip adduction   Hip internal rotation   Hip external rotation   Knee flexion   Knee extension   Ankle dorsiflexion   Ankle plantarflexion    Ankle inversion    Ankle eversion     (Blank rows = not tested)  LOWER EXTREMITY MMT:    MMT Right eval Left eval  Hip flexion  3+/5 3/5; pain  Hip extension    Hip abduction    Hip adduction    Hip internal rotation    Hip external rotation    Knee flexion    Knee extension 4/5 3+/5; pain  Ankle dorsiflexion 5/5   Ankle plantarflexion    Ankle inversion    Ankle eversion     (Blank rows = not tested)  LUMBAR SPECIAL TESTS:  Stork standing: unable to assess due to pain provoked w/ other testing, SI Compression/distraction test: Negative; pain/pressure where therapists hands were, repeated x2 w/ same results despite varied hand positioning, FABER test: positive; pinpoint left SIJ pain, and Gaenslen's test: negative; general low back pain, position limited by body habitus  FUNCTIONAL TESTS:  5 times sit to stand: To be assessed.  GAIT: Distance walked: various clinic distances Assistive device utilized: None Level of assistance: Complete Independence Comments: Antalgic, left hip hike secondary to boot on left foot.  This boot may be removed at appt 8/20.  TODAY'S TREATMENT:  DATE: 11/05/2022 -Verbally reviewed HEP - pt has current copy and has recently been doing this with good challenge still. -5xSTS:  23.34 sec w/ intermittent UE support -pt ambulates 345' no AD IND 0/10 pain -Oswestry:  22/50 -LEFS:  30/80  PATIENT EDUCATION:  Education details:  Continue HEP.   Discharge plan for today and progress towards goals.  Ongoing stress management as it seems recent methods for this may have helped improve her pain. Person educated: Patient Education method: Explanation Education comprehension: verbalized understanding  HOME EXERCISE PROGRAM: Access Code: [email or web address] URL: [email or web address] Date: 09/23/2022 Prepared by: Camille Bal  Exercises - Supine Bridge  - 1 x daily - 4 x weekly - 2 sets - 10 reps - Supine Lower Trunk Rotation  - 1 x daily - 5 x weekly -  2-3 sets - 10 reps - Seated Ankle Alphabet  - 1 x daily - 4-5 x weekly - 1-2 sets - Sit to Stand Without Arm Support  - 1 x daily - 4 x weekly - 2-3 sets - 10 reps - Heel Raises with Counter Support  - 1 x daily - 4 x weekly - 2-3 sets - 15 reps - Prone Press Up On Elbows  - 1 x daily - 4 x weekly - 3 sets - 10 reps - Bird Dog on Counter  - 1 x daily - 4 x weekly - 2 sets - 10 reps  ASSESSMENT:  CLINICAL IMPRESSION: Patient presents today with marked improvement in pain following stress management activities pt pursued outside PT clinic.  She met or partially met all goals as assessed today.  Her LEFS improved from 17 to 30/80 demonstrating a significant increase in the ability to perform high level activities.  Her Oswestry improved from 26 to 22/50 demonstrating some improvement in her pain related to functional participation.  She ambulated 345 ft with improved cadence and mechanics and no impact on her pain.  She is working towards progressed distances at home as well.  Her 5xSTS, though not to goal level, did significantly improve demonstrating improved LE strength and decreased fall risk.  At this time she is appropriate for and in agreement to discharge with instruction to return should functional status change.  OBJECTIVE IMPAIRMENTS: Abnormal gait, decreased activity tolerance, decreased balance, decreased endurance, decreased mobility, decreased strength, increased muscle spasms, impaired flexibility, impaired sensation, impaired vision/preception, improper body mechanics, postural dysfunction, obesity, and pain.   ACTIVITY LIMITATIONS: carrying, lifting, bending, standing, squatting, stairs, transfers, bathing, dressing, reach over head, hygiene/grooming, locomotion level, and caring for others  PARTICIPATION LIMITATIONS: meal prep, cleaning, laundry, driving, shopping, community activity, occupation, and yard work  PERSONAL FACTORS: Fitness, Past/current experiences, Sex,  Transportation, and 1-2 comorbidities: HTN, DM2 (cannot have cortiosteroid injection for inflammation/pain relief)  are also affecting patient's functional outcome.   REHAB POTENTIAL: Fair see PMH and personal factors  CLINICAL DECISION MAKING: Evolving/moderate complexity  EVALUATION COMPLEXITY: Moderate   GOALS: Goals reviewed with patient? Yes  SHORT TERM GOALS: Target date: 10/03/2022  Pt will be independent and compliant with introductory strength and stretching HEP in order to maintain functional progress and improve mobility. Baseline:  Patient is intermittently compliant (9/18) Goal status: IN PROGRESS  2.  Patient will report pain of less than or equal to 3/10 at rest in order to demonstrate improved pain management. Baseline: 5/10; 8/10 (9/18) Goal status: NOT MET  3.  Pt will decrease 5xSTS to </=21.03 seconds in order to demonstrate decreased risk for falls and improved  functional bilateral LE strength and power. Baseline: 31.03 sec w/ BUE support (8/29); 32.81 sec w/o UE support (9/18) Goal status: IN PROGRESS  LONG TERM GOALS: Target date: 10/31/2022  Pt will be independent and compliant with advanced strength and stretching HEP in order to maintain functional progress and improve mobility. Baseline:  Pt is IND, compliant and has current copy (10/23) Goal status: MET  2.  Pt will decrease 5xSTS to </=11.03 seconds in order to demonstrate decreased risk for falls and improved functional bilateral LE strength and power. Baseline: 31.03 sec w/ BUE support (8/29); 23.34 sec w/ intermittent UE support (10/23) Goal status: PARTIALLY MET  3.  Patient will ambulate >/=250 feet without increased pain in order to improve functional mobility and ease of household ambulation. Baseline: all activities increased pain 8/19; pt ambulates 345' no AD IND 0/10 pain (10/23) Goal status: MET  4.  Patient will improve modified Oswestry score to </= 17/50 in order to demonstrate  improved subjective functioning and quality of life. Baseline: 26/50; 22/50 (10/23) Goal status: PARTIALLY MET  5.  Patient will improve LEFS score to >/=26 in order to demonstrate improved subjective functioning and quality of life. Baseline: 17/80 (8/29); 30/80 (10/23) Goal status:  MET PLAN:  PT FREQUENCY: 1x/week  PT DURATION: 8 weeks  PLANNED INTERVENTIONS: Therapeutic exercises, Therapeutic activity, Neuromuscular re-education, Balance training, Gait training, Patient/Family education, Self Care, Joint mobilization, Stair training, Vestibular training, DME instructions, Aquatic Therapy, Dry Needling, Electrical stimulation, Spinal mobilization, Moist heat, Taping, Manual therapy, and Re-evaluation.  PLAN FOR NEXT SESSION:  N/A  Sadie Haber, PT, DPT 11/05/2022, 5:53 PM

## 2022-11-06 DIAGNOSIS — M20011 Mallet finger of right finger(s): Secondary | ICD-10-CM | POA: Diagnosis not present

## 2022-11-06 DIAGNOSIS — M25641 Stiffness of right hand, not elsewhere classified: Secondary | ICD-10-CM | POA: Diagnosis not present

## 2022-11-06 DIAGNOSIS — E119 Type 2 diabetes mellitus without complications: Secondary | ICD-10-CM | POA: Diagnosis not present

## 2022-11-06 DIAGNOSIS — W19XXXS Unspecified fall, sequela: Secondary | ICD-10-CM | POA: Diagnosis not present

## 2022-11-06 DIAGNOSIS — M25541 Pain in joints of right hand: Secondary | ICD-10-CM | POA: Diagnosis not present

## 2022-11-06 DIAGNOSIS — R202 Paresthesia of skin: Secondary | ICD-10-CM | POA: Diagnosis not present

## 2022-11-07 ENCOUNTER — Encounter
Payer: Federal, State, Local not specified - PPO | Attending: Physical Medicine & Rehabilitation | Admitting: Physical Medicine & Rehabilitation

## 2022-11-07 ENCOUNTER — Encounter: Payer: Self-pay | Admitting: Physical Medicine & Rehabilitation

## 2022-11-07 VITALS — BP 161/83 | HR 81 | Temp 97.8°F | Ht 66.0 in

## 2022-11-07 DIAGNOSIS — M533 Sacrococcygeal disorders, not elsewhere classified: Secondary | ICD-10-CM

## 2022-11-07 MED ORDER — LIDOCAINE HCL (PF) 2 % IJ SOLN
2.0000 mL | Freq: Once | INTRAMUSCULAR | Status: AC
Start: 2022-11-07 — End: 2022-11-07
  Administered 2022-11-07: 2 mL

## 2022-11-07 MED ORDER — BETAMETHASONE SOD PHOS & ACET 6 (3-3) MG/ML IJ SUSP
12.0000 mg | Freq: Once | INTRAMUSCULAR | Status: AC
Start: 2022-11-07 — End: 2022-11-07
  Administered 2022-11-07: 12 mg via INTRAMUSCULAR

## 2022-11-07 MED ORDER — LIDOCAINE HCL 1 % IJ SOLN
4.0000 mL | Freq: Once | INTRAMUSCULAR | Status: AC
Start: 2022-11-07 — End: 2022-11-07
  Administered 2022-11-07: 4 mL

## 2022-11-07 NOTE — Progress Notes (Signed)
PROCEDURE RECORD [facility]   Name: Tina [name] DOB:22-Jul-1970 MRN: [id number]  Date:11/07/2022  Physician: Claudette Laws, MD    Nurse/CMA: Reggie Bise RMA  Allergies:  Allergies  Allergen Reactions   Amlodipine Benzoate Swelling and Other (See Comments)    Leg swelling Other reaction(s): leg swelling Other reaction(s): ? leg swelling at 10 mg dose Other reaction(s): ? leg swelling at 10 mg dose Other reaction(s): ? leg swelling at 10 mg dose   Contrast Media [Iodinated Contrast Media] Shortness Of Breath and Other (See Comments)    Difficulty breathing   Iodine Anaphylaxis    Other reaction(s): Unknown Other reaction(s): Unknown Other reaction(s): Unknown Other reaction(s): Unknown   Iohexol Hives, Nausea And Vomiting, Swelling and Other (See Comments)     Desc: Magnevist-gadolinium-difficulty breathing, throat swelling    Midazolam Hcl Anaphylaxis    Difficulty breathing   Other Shortness Of Breath, Itching and Other (See Comments)    Patient is allergic to all nuts except peanuts, which are NOT "nuts"- they are legumes   Shellfish Allergy Anaphylaxis   Shellfish-Derived Products Anaphylaxis and Other (See Comments)    Other reaction(s): Unknown   Valsartan Swelling    Other reaction(s): Unknown Other reaction(s): Unknown Other reaction(s): angioedema Other reaction(s): angioedema   Metformin And Related Diarrhea, Nausea And Vomiting and Other (See Comments)    Dehydration, also   [place] [Dapagliflozin] Nausea Only and Other (See Comments)    UTI, headaches and muscle aches, also   Hydralazine Other (See Comments)    Reaction not recalled Other reaction(s): Unknown Other reaction(s): Unknown Other reaction(s): Unknown Other reaction(s): Unknown   Saxagliptin Hives, Nausea And Vomiting and Other (See Comments)    ONGLYZA- dehydration, also Other reaction(s): Unknown Other reaction(s): Unknown Other reaction(s):  Unknown Other reaction(s): Unknown   Spironolactone Other (See Comments)    Elevated potassium- Severe hyperkalemia Other reaction(s): severe hyperkalemia Other reaction(s): severe hyperkalemia Other reaction(s): severe hyperkalemia   Avandia [Rosiglitazone Maleate] Hives and Other (See Comments)   Geodon [Ziprasidone] Other (See Comments)    Reaction not recalled   Kiwi Extract Itching and Swelling   Latex Itching    Other reaction(s): Unknown Other reaction(s): Unknown    Consent Signed: Yes.    Is patient diabetic? Yes.    CBG today? 185  Pregnant: No. LMP: No LMP recorded. Patient is perimenopausal. (age 81-55)  Anticoagulants: no Anti-inflammatory: no Antibiotics: no  Procedure: Right Sacroiliac Injection W/O Contrast  Position: Prone Start Time: 11:15  End Time: 11:22  Fluoro Time: 21  RN/CMA Tehya Leath RMA Naithan Delage RMA    Time 11:00AM 11:25PM    BP 142/81 152/90    Pulse 81 80    Respirations 16 16    O2 Sat 94  94    S/S 6 6    Pain Level 8/10 5/10     D/C home with Self, patient A & O X 3, D/C instructions reviewed, and sits independently.

## 2022-11-07 NOTE — Progress Notes (Signed)
Right sacroiliac injection under fluoroscopic guidance  Indication: Right Low back and buttocks pain not relieved by medication management and other conservative care.Non contrast due to dye allergy  Informed consent was obtained after describing risks and benefits of the procedure with the patient, this includes bleeding, bruising, infection, paralysis and medication side effects. The patient wishes to proceed and has given written consent. The patient was placed in a prone position. The lumbar and sacral area was marked and prepped with Betadine. A 25-gauge 1-1/2 inch needle was inserted into the skin and subcutaneous tissue and 1 mL of 1% lidocaine was injected. Then a 25-gauge 3 inch spinal needle was inserted under fluoroscopic guidance into the Right sacroiliac joint. AP and lateral images were utilized.  Then a solution containing one ML of 6 mgper ml betamethasone and 2 ML of 1% lidocaine MPF was injected x1.5 mL. Patient tolerated the procedure well. Post procedure instructions were given. Please see post procedure form.

## 2022-11-11 DIAGNOSIS — G5601 Carpal tunnel syndrome, right upper limb: Secondary | ICD-10-CM | POA: Diagnosis not present

## 2022-11-11 DIAGNOSIS — M20011 Mallet finger of right finger(s): Secondary | ICD-10-CM | POA: Diagnosis not present

## 2022-11-13 ENCOUNTER — Other Ambulatory Visit: Payer: Self-pay | Admitting: Cardiology

## 2022-11-13 ENCOUNTER — Other Ambulatory Visit: Payer: Self-pay | Admitting: Physical Medicine & Rehabilitation

## 2022-11-20 DIAGNOSIS — I129 Hypertensive chronic kidney disease with stage 1 through stage 4 chronic kidney disease, or unspecified chronic kidney disease: Secondary | ICD-10-CM | POA: Diagnosis not present

## 2022-11-20 DIAGNOSIS — E1122 Type 2 diabetes mellitus with diabetic chronic kidney disease: Secondary | ICD-10-CM | POA: Diagnosis not present

## 2022-11-20 DIAGNOSIS — E785 Hyperlipidemia, unspecified: Secondary | ICD-10-CM | POA: Diagnosis not present

## 2022-11-20 DIAGNOSIS — N1831 Chronic kidney disease, stage 3a: Secondary | ICD-10-CM | POA: Diagnosis not present

## 2022-11-21 DIAGNOSIS — M25641 Stiffness of right hand, not elsewhere classified: Secondary | ICD-10-CM | POA: Diagnosis not present

## 2022-11-21 DIAGNOSIS — M25541 Pain in joints of right hand: Secondary | ICD-10-CM | POA: Diagnosis not present

## 2022-11-21 DIAGNOSIS — M20011 Mallet finger of right finger(s): Secondary | ICD-10-CM | POA: Diagnosis not present

## 2022-12-01 DIAGNOSIS — E113511 Type 2 diabetes mellitus with proliferative diabetic retinopathy with macular edema, right eye: Secondary | ICD-10-CM | POA: Diagnosis not present

## 2022-12-04 DIAGNOSIS — E1169 Type 2 diabetes mellitus with other specified complication: Secondary | ICD-10-CM | POA: Diagnosis not present

## 2022-12-04 DIAGNOSIS — E1165 Type 2 diabetes mellitus with hyperglycemia: Secondary | ICD-10-CM | POA: Diagnosis not present

## 2022-12-04 DIAGNOSIS — E1122 Type 2 diabetes mellitus with diabetic chronic kidney disease: Secondary | ICD-10-CM | POA: Diagnosis not present

## 2022-12-08 DIAGNOSIS — E113512 Type 2 diabetes mellitus with proliferative diabetic retinopathy with macular edema, left eye: Secondary | ICD-10-CM | POA: Diagnosis not present

## 2022-12-15 DIAGNOSIS — M20011 Mallet finger of right finger(s): Secondary | ICD-10-CM | POA: Diagnosis not present

## 2022-12-15 DIAGNOSIS — M25541 Pain in joints of right hand: Secondary | ICD-10-CM | POA: Diagnosis not present

## 2022-12-15 DIAGNOSIS — M25641 Stiffness of right hand, not elsewhere classified: Secondary | ICD-10-CM | POA: Diagnosis not present

## 2022-12-17 DIAGNOSIS — E113513 Type 2 diabetes mellitus with proliferative diabetic retinopathy with macular edema, bilateral: Secondary | ICD-10-CM | POA: Diagnosis not present

## 2022-12-17 DIAGNOSIS — H35352 Cystoid macular degeneration, left eye: Secondary | ICD-10-CM | POA: Diagnosis not present

## 2022-12-17 DIAGNOSIS — H3582 Retinal ischemia: Secondary | ICD-10-CM | POA: Diagnosis not present

## 2022-12-19 ENCOUNTER — Encounter
Payer: Federal, State, Local not specified - PPO | Attending: Physical Medicine & Rehabilitation | Admitting: Physical Medicine & Rehabilitation

## 2022-12-19 ENCOUNTER — Encounter: Payer: Self-pay | Admitting: Physical Medicine & Rehabilitation

## 2022-12-19 VITALS — BP 142/87 | HR 75 | Ht 66.0 in | Wt 260.0 lb

## 2022-12-19 DIAGNOSIS — M533 Sacrococcygeal disorders, not elsewhere classified: Secondary | ICD-10-CM

## 2022-12-19 MED ORDER — TRAMADOL HCL 50 MG PO TABS: 50.0000 mg | ORAL_TABLET | Freq: Two times a day (BID) | ORAL | 5 refills | Status: DC

## 2022-12-19 NOTE — Progress Notes (Signed)
Subjective:    Patient ID: Tina [name], female    DOB: [birth year], 51 y.o.   MRN: [id number]  HPI 52 year old female with history of bilateral pontine strokes with chronic right spastic hemiparesis returns today for complaints of right low back and buttock pain.  Has been previously diagnosed with sacroiliac disorder a sacroiliac injection previously that lasted for months.  Had a fall last summer and had an exacerbation of her pain.  She went through physical therapy with only partial relief and then underwent sacroiliac injection which gave her only a 2-day relief on this occasion.  She remains functionally independent with all self-care and mobility she drives she takes care of her children she does not work outside the home. Taking 2 tramadol/day  No longer taking Tramadol ER No longer taking Tylenol 3 Finished PT on Oct 23,2024  Doing stretches from PT daily  Walks dog outside 3 x per day  Trying to lose weight  Pain Inventory Average Pain 10 Pain Right Now 9 My pain is sharp and burning  In the last 24 hours, has pain interfered with the following? General activity 8 Relation with others 9 Enjoyment of life 9 What TIME of day is your pain at its worst? morning , daytime, evening, and night Sleep (in general) Fair  Pain is worse with: walking, bending, standing, and some activites Pain improves with: rest and heat/ice Relief from Meds: 4  Family History  Problem Relation Age of Onset   Diabetes Father    Kidney disease Father    Depression Father    Drug abuse Father    Allergic rhinitis Mother    Eczema Mother    Urticaria Mother    Depression Mother    Anxiety disorder Mother    Bipolar disorder Mother    Alcoholism Mother    Drug abuse Mother    Eating disorder Mother    Diabetes Maternal Grandmother    Hyperlipidemia Paternal Grandmother    Stroke Paternal Grandmother    Eczema Sister    Urticaria Sister    Colon cancer Paternal Uncle    Other Neg  Hx    Angioedema Neg Hx    Asthma Neg Hx    Colon polyps Neg Hx    Esophageal cancer Neg Hx    Rectal cancer Neg Hx    Stomach cancer Neg Hx    Social History   Socioeconomic History   Marital status: Married    Spouse name: Jeannett Senior   Number of children: 3   Years of education: BA degree   Highest education level: Not on file  Occupational History   Occupation: stay at home mom    Employer: UNEMPLOYED  Tobacco Use   Smoking status: Never   Smokeless tobacco: Never  Vaping Use   Vaping status: Never Used  Substance and Sexual Activity   Alcohol use: No    Alcohol/week: 0.0 standard drinks of alcohol   Drug use: Not Currently   Sexual activity: Yes    Birth control/protection: None  Other Topics Concern   Not on file  Social History Narrative   Patient is married with 2 children.   Patient is right handed.   Patient has her  BA degree.   Patient drinks 1 cup daily.   Social Determinants of Health   Financial Resource Strain: Not on file  Food Insecurity: No Food Insecurity (07/27/2019)   Hunger Vital Sign    Worried About Running Out of Food in the  Last Year: Never true    Ran Out of Food in the Last Year: Never true  Transportation Needs: No Transportation Needs (07/27/2019)   PRAPARE - Administrator, Civil Service (Medical): No    Lack of Transportation (Non-Medical): No  Physical Activity: Not on file  Stress: Not on file  Social Connections: Not on file   Past Surgical History:  Procedure Laterality Date   APPLICATION OF WOUND VAC N/A 05/25/2015   Procedure: APPLICATION OF WOUND VAC;  Surgeon: Karie Soda, MD;  Location: [facility];  Service: General;  Laterality: N/A;   CARDIAC CATHETERIZATION     CESAREAN SECTION  2012   COLONOSCOPY     CORONARY STENT INTERVENTION N/A 09/05/2020   Procedure: CORONARY STENT INTERVENTION;  Surgeon: Elder Negus, MD;  [facility];  Service: Cardiovascular;  Laterality: N/A;   CORONARY  ULTRASOUND/IVUS N/A 09/05/2020   Procedure: Intravascular Ultrasound/IVUS;  Surgeon: Elder Negus, MD;  [facility]: [facility];  Service: Cardiovascular;  Laterality: N/A;   DIAGNOSTIC LAPAROSCOPY     ESOPHAGOGASTRODUODENOSCOPY N/A 06/03/2012   Procedure: ESOPHAGOGASTRODUODENOSCOPY (EGD);  Surgeon: Charna Elizabeth, MD;  Location: [facility];  Service: Endoscopy;  Laterality: N/A;   EXCISION MASS ABDOMINAL N/A 05/25/2015   Procedure: ABDOMINAL WALL EXPLORATION EXCISION OF SEROMA REMOVAL OF REDUNDANT SKIN ;  Surgeon: Karie Soda, MD;  Location: [facility];  Service: General;  Laterality: N/A;   EYE SURGERY     Eye laser for vessel hemorrhaging   fybroid removal     HERNIA REPAIR  10/03/2011   ventral hernia repair   INSERTION OF MESH N/A 02/04/2013   Procedure: INSERTION OF MESH;  Surgeon: Ardeth Sportsman, MD;  Location: [facility];  Service: General;  Laterality: N/A;   LEFT HEART CATH AND CORONARY ANGIOGRAPHY N/A 09/05/2020   Procedure: LEFT HEART CATH AND CORONARY ANGIOGRAPHY;  Surgeon: Elder Negus, MD;  [facility];  Service: Cardiovascular;  Laterality: N/A;   PARS PLANA VITRECTOMY Left 08/24/2022   Procedure: PARS PLANA VITRECTOMY 25 GAUGE FOR ENDOPHTHALMITIS; INJECTION OF ANTIBIOTIC;  Surgeon: Carmela Rima, MD;  Location: [facility];  Service: Ophthalmology;  Laterality: Left;   UMBILICAL HERNIA REPAIR N/A 02/04/2013   Procedure: LAPAROSCOPIC ventral wall hernia repair LAPAROSCOPIC LYSIS OF ADHESIONS laparoscopic exploration of abdomen ;  Surgeon: Ardeth Sportsman, MD;  Location: [facility];  Service: General;  Laterality: N/A;   UPPER GASTROINTESTINAL ENDOSCOPY     URETER REVISION     Bilateral "twisted"   UTERINE FIBROID SURGERY     2 SURGERIES FOR FIBROIDS   VENTRAL HERNIA REPAIR  10/03/2011   Procedure: LAPAROSCOPIC VENTRAL HERNIA;  Surgeon: Ardeth Sportsman, MD;  Location: [facility];  Service: General;  Laterality: N/A;   Past Surgical History:  Procedure  Laterality Date   APPLICATION OF WOUND VAC N/A 05/25/2015   Procedure: APPLICATION OF WOUND VAC;  Surgeon: Karie Soda, MD;  [facility]: [facility];  Service: General;  Laterality: N/A;   CARDIAC CATHETERIZATION     CESAREAN SECTION  2012   COLONOSCOPY     CORONARY STENT INTERVENTION N/A 09/05/2020   Procedure: CORONARY STENT INTERVENTION;  Surgeon: Elder Negus, MD;  [facility];  Service: Cardiovascular;  Laterality: N/A;   CORONARY ULTRASOUND/IVUS N/A 09/05/2020   Procedure: Intravascular Ultrasound/IVUS;  Surgeon: Elder Negus, MD;  [facility];  Service: Cardiovascular;  Laterality: N/A;   DIAGNOSTIC LAPAROSCOPY  ESOPHAGOGASTRODUODENOSCOPY N/A 06/03/2012   Procedure: ESOPHAGOGASTRODUODENOSCOPY (EGD);  Surgeon: Charna Elizabeth, MD;  Location: [facility] END[facility];  Service: Endoscopy;  Laterality: N/A;   EXCISION MASS ABDOMINAL N/A 05/25/2015   Procedure: ABDOMINAL WALL EXPLORATION EXCISION OF SEROMA REMOVAL OF REDUNDANT SKIN ;  Surgeon: Karie Soda, MD;  Location: [facility];  Service: General;  Laterality: N/A;   EYE SURGERY     Eye laser for vessel hemorrhaging   fybroid removal     HERNIA REPAIR  10/03/2011   ventral hernia repair   INSERTION OF MESH N/A 02/04/2013   Procedure: INSERTION OF MESH;  Surgeon: Ardeth Sportsman, MD;  [facility]: [facility];  Service: General;  Laterality: N/A;   LEFT HEART CATH AND CORONARY ANGIOGRAPHY N/A 09/05/2020   Procedure: LEFT HEART CATH AND CORONARY ANGIOGRAPHY;  Surgeon: Elder Negus, MD;  [facility];  Service: Cardiovascular;  Laterality: N/A;   PARS PLANA VITRECTOMY Left 08/24/2022   Procedure: PARS PLANA VITRECTOMY 25 GAUGE FOR ENDOPHTHALMITIS; INJECTION OF ANTIBIOTIC;  Surgeon: Carmela Rima, MD;  Location: [facility] OR;  Service: Ophthalmology;  Laterality: Left;   UMBILICAL HERNIA REPAIR N/A 02/04/2013   Procedure: LAPAROSCOPIC ventral wall hernia repair LAPAROSCOPIC LYSIS OF ADHESIONS  laparoscopic exploration of abdomen ;  Surgeon: Ardeth Sportsman, MD;  Location: [facility];  Service: General;  Laterality: N/A;   UPPER GASTROINTESTINAL ENDOSCOPY     URETER REVISION     Bilateral "twisted"   UTERINE FIBROID SURGERY     2 SURGERIES FOR FIBROIDS   VENTRAL HERNIA REPAIR  10/03/2011   Procedure: LAPAROSCOPIC VENTRAL HERNIA;  Surgeon: Ardeth Sportsman, MD;  Location: [facility];  Service: General;  Laterality: N/A;   Past Medical History:  Diagnosis Date   Allergy    Anemia    DURING MENSES--HAS HEAVY BLEEDING WITH PERODS   Angioedema 08/13/2020   Anxiety    Back pain, chronic    "ongoing"   Blood transfusion    IN 2012  AFTER C -SECTION   Cerebral thrombosis with cerebral infarction (HCC) 06/2009   RIGHT SIDED WEAKNESS ( ARM AND LEG ) AND SPASMS-remains with slight weakness and vertigo.   Constipation    Depression    Diabetes mellitus    Diabetic neuropathy (HCC)    BOTH FEET --COMES AND GOES   Edema, lower extremity    Endometriosis    Fatty liver    GERD (gastroesophageal reflux disease)    with pregnancy   H/O eye surgery    Headache(784.0)    MIGRAINES--NOT REALLY HEADACHE-MORE LIKE PRESSURE SENSATION IN HEAD-FEELS DIZZIY AND  FAINT AS THE PRESSURE RESOLVES   Hernia, incisional, RLQ, s/p lap repair Sep 2013 09/02/2011   History of vertigo 03/22/2018   Hx of migraines 10/19/2011   Hyperlipidemia    Hypertension    Leg pain, right    "like bad Charley horse"   Multiple food allergies    Panic disorder without agoraphobia    Rash    HANDS, ARMS --STATES HX OF RASH EVER SINCE CHILDBIRTH/PREGNANCY.  STATES THE RASH OFTEN OCCURS WHEN SHE IS REALLY STRESSED."goes and comes-presently left ring finger"   Restless leg syndrome    DIAGNOSED BY SLEEP STUDY - PT TOLD SHE DID NOT HAVE SLEEP APNEA   Right rotator cuff tear    PAIN IN RIGHT SHOULDER   SBO (small bowel obstruction) (HCC) 06/03/2012   Shortness of breath    Spastic hemiplegia affecting dominant side (HCC)     Stomach problems  Stroke (HCC)    Ventral hernia    RIGHT LOWER QUADRANT-CAUSING SOME PAIN   Weakness of right side of body    Ht 5\' 6"  (1.676 m)   Wt 260 lb (117.9 kg)   BMI 41.97 kg/m   Opioid Risk Score:   Fall Risk Score:  `1  Depression screen PHQ 2/9     11/07/2022   11:00 AM 09/11/2022    2:54 PM 08/12/2022    2:54 PM 09/03/2021    1:23 PM 07/11/2021   11:20 AM 05/30/2021   10:56 AM 03/26/2021   10:21 AM  Depression screen PHQ 2/9  Decreased Interest 0 0 0 1 0 1 1  Down, Depressed, Hopeless 0 0 0 1 0 1 1  PHQ - 2 Score 0 0 0 2 0 2 2      Review of Systems  Musculoskeletal:  Positive for back pain.  All other systems reviewed and are negative.     Objective:   Physical Exam General No acute distress Mood and affect appropriate Extremities without edema Motor strength is 4/5 in the right deltoid bicep tricep grip as well as right hip flexor knee extensor ankle dorsiflexor 5/5 on the left side lumbar range of motion is reduced to 50% flexion and 25% extension.  Sacral thrust (prone) : Positive on right side Lateral compression: Negative FABER's: Positive on right side Distraction (supine): Negative Thigh thrust test: Negative       Assessment & Plan:  #1.  Chronic sacroiliac disorder who has completed physical therapy with partial relief, still complains that her walking tolerance is reduced.  She states she used to be able to walk through the [place] but no longer is able to do so.  She only got a short-term relief with intra-articular sacroiliac injection under fluoroscopic guidance.  She is looking for other treatment options.  She is on low-dose narcotic analgesics 50 mg of tramadol twice a day which gives her some partial relief and she does not wish to go up on her medications at all.  We discussed possibility of sacroiliac fusion with minimally invasive.  She would like to pursue this option and would like consultation with Dr. Yevette Edwards for this.   Have made referral.  I will see her back in 6 months.

## 2022-12-22 DIAGNOSIS — Z1231 Encounter for screening mammogram for malignant neoplasm of breast: Secondary | ICD-10-CM | POA: Diagnosis not present

## 2022-12-22 DIAGNOSIS — Z01419 Encounter for gynecological examination (general) (routine) without abnormal findings: Secondary | ICD-10-CM | POA: Diagnosis not present

## 2022-12-22 DIAGNOSIS — Z01411 Encounter for gynecological examination (general) (routine) with abnormal findings: Secondary | ICD-10-CM | POA: Diagnosis not present

## 2022-12-22 DIAGNOSIS — Z1331 Encounter for screening for depression: Secondary | ICD-10-CM | POA: Diagnosis not present

## 2022-12-22 DIAGNOSIS — Z124 Encounter for screening for malignant neoplasm of cervix: Secondary | ICD-10-CM | POA: Diagnosis not present

## 2022-12-22 DIAGNOSIS — Z113 Encounter for screening for infections with a predominantly sexual mode of transmission: Secondary | ICD-10-CM | POA: Diagnosis not present

## 2022-12-22 DIAGNOSIS — N912 Amenorrhea, unspecified: Secondary | ICD-10-CM | POA: Diagnosis not present

## 2022-12-24 DIAGNOSIS — M20011 Mallet finger of right finger(s): Secondary | ICD-10-CM | POA: Diagnosis not present

## 2022-12-24 DIAGNOSIS — M65342 Trigger finger, left ring finger: Secondary | ICD-10-CM | POA: Diagnosis not present

## 2022-12-31 DIAGNOSIS — R936 Abnormal findings on diagnostic imaging of limbs: Secondary | ICD-10-CM | POA: Diagnosis not present

## 2022-12-31 DIAGNOSIS — M20011 Mallet finger of right finger(s): Secondary | ICD-10-CM | POA: Diagnosis not present

## 2023-01-03 DIAGNOSIS — E1122 Type 2 diabetes mellitus with diabetic chronic kidney disease: Secondary | ICD-10-CM | POA: Diagnosis not present

## 2023-01-03 DIAGNOSIS — E1169 Type 2 diabetes mellitus with other specified complication: Secondary | ICD-10-CM | POA: Diagnosis not present

## 2023-01-03 DIAGNOSIS — E1165 Type 2 diabetes mellitus with hyperglycemia: Secondary | ICD-10-CM | POA: Diagnosis not present

## 2023-01-06 DIAGNOSIS — M545 Low back pain, unspecified: Secondary | ICD-10-CM | POA: Diagnosis not present

## 2023-01-08 ENCOUNTER — Other Ambulatory Visit: Payer: Self-pay | Admitting: Orthopedic Surgery

## 2023-01-08 DIAGNOSIS — M545 Low back pain, unspecified: Secondary | ICD-10-CM

## 2023-01-08 DIAGNOSIS — R319 Hematuria, unspecified: Secondary | ICD-10-CM

## 2023-01-09 ENCOUNTER — Ambulatory Visit (HOSPITAL_COMMUNITY)
Admission: AD | Admit: 2023-01-09 | Discharge: 2023-01-09 | Disposition: A | Discharge status: Home / Self Care | Payer: Federal, State, Local not specified - PPO | Source: Ambulatory Visit | Attending: Ophthalmology | Admitting: Ophthalmology

## 2023-01-09 ENCOUNTER — Encounter (HOSPITAL_COMMUNITY)
Admission: AD | Disposition: A | Discharge status: Home / Self Care | Payer: Self-pay | Source: Ambulatory Visit | Attending: Ophthalmology

## 2023-01-09 DIAGNOSIS — I69351 Hemiplegia and hemiparesis following cerebral infarction affecting right dominant side: Secondary | ICD-10-CM | POA: Diagnosis not present

## 2023-01-09 DIAGNOSIS — H1032 Unspecified acute conjunctivitis, left eye: Secondary | ICD-10-CM | POA: Insufficient documentation

## 2023-01-09 DIAGNOSIS — H4419 Other endophthalmitis: Secondary | ICD-10-CM | POA: Diagnosis not present

## 2023-01-09 DIAGNOSIS — E113512 Type 2 diabetes mellitus with proliferative diabetic retinopathy with macular edema, left eye: Secondary | ICD-10-CM | POA: Diagnosis not present

## 2023-01-09 DIAGNOSIS — Z79891 Long term (current) use of opiate analgesic: Secondary | ICD-10-CM | POA: Insufficient documentation

## 2023-01-09 DIAGNOSIS — H44002 Unspecified purulent endophthalmitis, left eye: Secondary | ICD-10-CM | POA: Insufficient documentation

## 2023-01-09 DIAGNOSIS — H53132 Sudden visual loss, left eye: Secondary | ICD-10-CM | POA: Diagnosis not present

## 2023-01-09 DIAGNOSIS — H5712 Ocular pain, left eye: Secondary | ICD-10-CM | POA: Diagnosis not present

## 2023-01-09 HISTORY — PX: INJECTION, INTRAVITREAL, THERAPEUTIC AGENT, EYE: SHX7599

## 2023-01-09 LAB — GLUCOSE, CAPILLARY
Glucose-Capillary: 345 mg/dL — ABNORMAL HIGH (ref 70–99)
Glucose-Capillary: 269 mg/dL — ABNORMAL HIGH (ref 70–99)

## 2023-01-09 SURGERY — INJECTION, INTRAVITREAL, THERAPEUTIC AGENT, EYE
Anesthesia: LOCAL | Laterality: Left

## 2023-01-09 MED ORDER — CEFTAZIDIME INTRAVITREAL INJECTION 2.25 MG/0.1 ML
2.2500 mg | INTRAVITREAL | Status: DC
  Filled 2023-01-09: qty 0.1

## 2023-01-09 MED ORDER — LIDOCAINE HCL 2 % IJ SOLN
INTRAMUSCULAR | Status: AC
  Filled 2023-01-09: qty 20

## 2023-01-09 MED ORDER — TETRACAINE HCL 0.5 % OP SOLN
OPHTHALMIC | Status: AC
  Filled 2023-01-09: qty 4

## 2023-01-09 MED ORDER — VORICONAZOLE INTRAOCULAR INJECTION 100 MCG/ 0.1 ML KALEIDOSCOPE
100.0000 ug | INTRAVITREAL | Status: DC
  Filled 2023-01-09: qty 0.1

## 2023-01-09 MED ORDER — DEXAMETHASONE SODIUM PHOSPHATE 10 MG/ML IJ SOLN
INTRAMUSCULAR | Status: AC
  Filled 2023-01-09: qty 1

## 2023-01-09 MED ORDER — BSS IO SOLN
INTRAOCULAR | Status: AC
  Filled 2023-01-09: qty 15

## 2023-01-09 MED ORDER — VANCOMYCIN INTRAVITREAL INJECTION 1 MG/0.1 ML
1.0000 mg | INTRAOCULAR | Status: DC
  Filled 2023-01-09: qty 0.1

## 2023-01-09 MED ORDER — OFLOXACIN 0.3 % OP SOLN
OPHTHALMIC | Status: AC
  Filled 2023-01-09: qty 5

## 2023-01-09 SURGICAL SUPPLY — 5 items
BETADINE 5% OPHTHALMIC (OPHTHALMIC) ×1 IMPLANT
NDL HYPO 25GX1X1/2 BEV (NEEDLE) IMPLANT
NEEDLE HYPO 25GX1X1/2 BEV (NEEDLE) ×2 IMPLANT
SHIELD EYE UNIVERSAL (MISCELLANEOUS) ×1 IMPLANT
SYR TB 1ML LUER SLIP (SYRINGE) ×2 IMPLANT

## 2023-01-12 ENCOUNTER — Telehealth: Payer: Self-pay | Admitting: *Deleted

## 2023-01-12 NOTE — Telephone Encounter (Signed)
Called patient to give lab results left message for patient to call back.    [place] Yoav Okane, Research Coordinator 01/12/2023  13:05 pm

## 2023-01-13 ENCOUNTER — Other Ambulatory Visit: Payer: Self-pay | Admitting: Orthopedic Surgery

## 2023-01-13 DIAGNOSIS — M545 Low back pain, unspecified: Secondary | ICD-10-CM

## 2023-01-14 LAB — AEROBIC/ANAEROBIC CULTURE W GRAM STAIN (SURGICAL/DEEP WOUND): Gram Stain: NONE SEEN

## 2023-01-16 DIAGNOSIS — N911 Secondary amenorrhea: Secondary | ICD-10-CM | POA: Diagnosis not present

## 2023-01-26 DIAGNOSIS — H44112 Panuveitis, left eye: Secondary | ICD-10-CM | POA: Diagnosis not present

## 2023-01-26 DIAGNOSIS — E113512 Type 2 diabetes mellitus with proliferative diabetic retinopathy with macular edema, left eye: Secondary | ICD-10-CM | POA: Diagnosis not present

## 2023-01-26 DIAGNOSIS — H35352 Cystoid macular degeneration, left eye: Secondary | ICD-10-CM | POA: Diagnosis not present

## 2023-01-26 DIAGNOSIS — H30022 Focal chorioretinal inflammation of posterior pole, left eye: Secondary | ICD-10-CM | POA: Diagnosis not present

## 2023-01-28 ENCOUNTER — Ambulatory Visit
Admission: RE | Admit: 2023-01-28 | Discharge: 2023-01-28 | Disposition: A | Discharge status: Home / Self Care | Payer: Federal, State, Local not specified - PPO | Source: Ambulatory Visit | Attending: Orthopedic Surgery | Admitting: Orthopedic Surgery

## 2023-01-28 DIAGNOSIS — M545 Low back pain, unspecified: Secondary | ICD-10-CM

## 2023-01-28 DIAGNOSIS — M47816 Spondylosis without myelopathy or radiculopathy, lumbar region: Secondary | ICD-10-CM | POA: Diagnosis not present

## 2023-01-30 DIAGNOSIS — E1122 Type 2 diabetes mellitus with diabetic chronic kidney disease: Secondary | ICD-10-CM | POA: Diagnosis not present

## 2023-01-30 DIAGNOSIS — E7800 Pure hypercholesterolemia, unspecified: Secondary | ICD-10-CM | POA: Diagnosis not present

## 2023-01-30 LAB — CULTURE, FUNGUS WITHOUT SMEAR

## 2023-02-03 DIAGNOSIS — E1165 Type 2 diabetes mellitus with hyperglycemia: Secondary | ICD-10-CM | POA: Diagnosis not present

## 2023-02-03 DIAGNOSIS — E7800 Pure hypercholesterolemia, unspecified: Secondary | ICD-10-CM | POA: Diagnosis not present

## 2023-02-03 DIAGNOSIS — E1122 Type 2 diabetes mellitus with diabetic chronic kidney disease: Secondary | ICD-10-CM | POA: Diagnosis not present

## 2023-02-03 DIAGNOSIS — I10 Essential (primary) hypertension: Secondary | ICD-10-CM | POA: Diagnosis not present

## 2023-02-03 DIAGNOSIS — N1832 Chronic kidney disease, stage 3b: Secondary | ICD-10-CM | POA: Diagnosis not present

## 2023-02-03 DIAGNOSIS — E1169 Type 2 diabetes mellitus with other specified complication: Secondary | ICD-10-CM | POA: Diagnosis not present

## 2023-02-06 ENCOUNTER — Other Ambulatory Visit: Payer: Self-pay | Admitting: Orthopedic Surgery

## 2023-02-10 DIAGNOSIS — M65342 Trigger finger, left ring finger: Secondary | ICD-10-CM | POA: Diagnosis not present

## 2023-02-10 DIAGNOSIS — M20011 Mallet finger of right finger(s): Secondary | ICD-10-CM | POA: Diagnosis not present

## 2023-02-11 ENCOUNTER — Other Ambulatory Visit (HOSPITAL_COMMUNITY): Payer: Self-pay | Admitting: Orthopedic Surgery

## 2023-02-11 DIAGNOSIS — F329 Major depressive disorder, single episode, unspecified: Secondary | ICD-10-CM | POA: Diagnosis not present

## 2023-02-11 DIAGNOSIS — E113512 Type 2 diabetes mellitus with proliferative diabetic retinopathy with macular edema, left eye: Secondary | ICD-10-CM | POA: Diagnosis not present

## 2023-02-11 DIAGNOSIS — M545 Low back pain, unspecified: Secondary | ICD-10-CM

## 2023-02-11 DIAGNOSIS — N1832 Chronic kidney disease, stage 3b: Secondary | ICD-10-CM | POA: Diagnosis not present

## 2023-02-11 DIAGNOSIS — Z0000 Encounter for general adult medical examination without abnormal findings: Secondary | ICD-10-CM | POA: Diagnosis not present

## 2023-02-11 DIAGNOSIS — K219 Gastro-esophageal reflux disease without esophagitis: Secondary | ICD-10-CM | POA: Diagnosis not present

## 2023-02-11 DIAGNOSIS — E1159 Type 2 diabetes mellitus with other circulatory complications: Secondary | ICD-10-CM | POA: Diagnosis not present

## 2023-02-11 DIAGNOSIS — I129 Hypertensive chronic kidney disease with stage 1 through stage 4 chronic kidney disease, or unspecified chronic kidney disease: Secondary | ICD-10-CM | POA: Diagnosis not present

## 2023-02-11 DIAGNOSIS — E1151 Type 2 diabetes mellitus with diabetic peripheral angiopathy without gangrene: Secondary | ICD-10-CM | POA: Diagnosis not present

## 2023-02-11 DIAGNOSIS — I6930 Unspecified sequelae of cerebral infarction: Secondary | ICD-10-CM | POA: Diagnosis not present

## 2023-02-11 DIAGNOSIS — E785 Hyperlipidemia, unspecified: Secondary | ICD-10-CM | POA: Diagnosis not present

## 2023-02-11 DIAGNOSIS — E1140 Type 2 diabetes mellitus with diabetic neuropathy, unspecified: Secondary | ICD-10-CM | POA: Diagnosis not present

## 2023-02-11 DIAGNOSIS — E11311 Type 2 diabetes mellitus with unspecified diabetic retinopathy with macular edema: Secondary | ICD-10-CM | POA: Diagnosis not present

## 2023-02-11 DIAGNOSIS — M109 Gout, unspecified: Secondary | ICD-10-CM | POA: Diagnosis not present

## 2023-02-12 ENCOUNTER — Telehealth: Payer: Self-pay | Admitting: Cardiology

## 2023-02-12 NOTE — Telephone Encounter (Signed)
Left msg for pt to return call.

## 2023-02-12 NOTE — Telephone Encounter (Signed)
   Pre-operative Risk Assessment    Patient Name: Tina [name]  DOB: 1970/08/22 MRN: [id number]   Date of last office visit: 08/12/22 Date of next office visit: nothing scheduled, not due until July    Request for Surgical Clearance    Procedure:   RELEASE TRIGGER FINGER/A-1 PULLEY LEFT RING  Date of Surgery:  Clearance 03/05/23                                Surgeon:  Dr. Laban Emperor Group or Practice Name:  [facility]  Phone number:  [phone] Fax number:  [phone]    Type of Clearance Requested:   - Medical  - Pharmacy:  Hold Clopidogrel (Plavix) Steward Drone stated pt reported not taking this but it is listed on her med list)    Type of Anesthesia:  General    Additional requests/questions:    Alben Spittle   02/12/2023, 8:56 AM

## 2023-02-12 NOTE — Telephone Encounter (Signed)
Tina [name] 53 year old female is requesting preoperative cardiac evaluation for trigger finger release.  Procedure is scheduled for 03/05/2023.  She was last seen in the cardiology clinic for exam on 08/12/2022.  At that time she remained stable from a cardiac standpoint.  Follow-up was planned for 1 year.  Her PMH includes OSA on CPAP, hypertension, insulin-dependent diabetes, CVA 2011, obesity, and NSTEMI treated with OM 2 PCI 09/05/2020.  May her Plavix be held prior to her procedure?   Thank you for your help.  Please direct your response to CV DIV preop pool.  Thomasene Ripple. Ariday Brinker NP-C     02/12/2023, 10:45 AM [facility] [address] Office [phone] Fax [phone]

## 2023-02-12 NOTE — Telephone Encounter (Signed)
   Name: Tina [name]  DOB: April 11, 1970  MRN: [id number]  Primary Cardiologist: Odis Hollingshead   Preoperative team, please contact this patient and set up a phone call appointment for further preoperative risk assessment. Please obtain consent and complete medication review. Thank you for your help.  I confirm that guidance regarding antiplatelet and oral anticoagulation therapy has been completed and, if necessary, noted below.  Her Plavix may be held for 5 days prior to her procedure.  Please resume as soon as hemostasis is achieved.  I also confirmed the patient resides in the state of [place]. As per [facility] Medical Board telemedicine laws, the patient must reside in the state in which the provider is licensed.   Ronney Asters, NP 02/12/2023, 11:14 AM [facility]

## 2023-02-12 NOTE — Telephone Encounter (Signed)
Yes.   Tessa Lerner, DO, [facility]

## 2023-02-13 NOTE — Telephone Encounter (Signed)
2nd attempt to reach pt to schedule tele preop appt.,

## 2023-02-16 NOTE — Telephone Encounter (Signed)
3rd final attempt to reach pt regarding surgical clearance and the need for a tele visit, have been unable to reach pt.  Will remove from the preop pool and route back to the requesting surgeons' office to make them aware and if they are able to reach pt, they can have her contact our office.

## 2023-02-18 NOTE — Telephone Encounter (Signed)
 Left message to call back to offer an in office appt sooner. Procedure is 03/05/23 with med hold x 5 days for Plavix .

## 2023-02-18 NOTE — Telephone Encounter (Signed)
 Pt called back and scheduler scheduled the pt sooner appt to see Slater Duncan, NP. Ok per preop APP schedule in office as procedure 03/05/23 and pt will need med hold in time for procedure.

## 2023-02-18 NOTE — Telephone Encounter (Signed)
  Pt returned call and scheduled televisit on 03/03/23 at 2:20 PM and added to waitlist

## 2023-02-19 ENCOUNTER — Telehealth: Payer: Self-pay

## 2023-02-19 ENCOUNTER — Ambulatory Visit: Payer: Federal, State, Local not specified - PPO | Attending: Nurse Practitioner | Admitting: Nurse Practitioner

## 2023-02-19 ENCOUNTER — Encounter: Payer: Self-pay | Admitting: Nurse Practitioner

## 2023-02-19 VITALS — BP 126/78 | HR 74 | Ht 66.0 in | Wt 256.8 lb

## 2023-02-19 DIAGNOSIS — I10 Essential (primary) hypertension: Secondary | ICD-10-CM | POA: Diagnosis not present

## 2023-02-19 DIAGNOSIS — Z8673 Personal history of transient ischemic attack (TIA), and cerebral infarction without residual deficits: Secondary | ICD-10-CM

## 2023-02-19 DIAGNOSIS — Z79899 Other long term (current) drug therapy: Secondary | ICD-10-CM

## 2023-02-19 DIAGNOSIS — E1140 Type 2 diabetes mellitus with diabetic neuropathy, unspecified: Secondary | ICD-10-CM | POA: Diagnosis not present

## 2023-02-19 DIAGNOSIS — Z6841 Body Mass Index (BMI) 40.0 and over, adult: Secondary | ICD-10-CM

## 2023-02-19 DIAGNOSIS — E785 Hyperlipidemia, unspecified: Secondary | ICD-10-CM

## 2023-02-19 DIAGNOSIS — Z01818 Encounter for other preprocedural examination: Secondary | ICD-10-CM

## 2023-02-19 DIAGNOSIS — I2510 Atherosclerotic heart disease of native coronary artery without angina pectoris: Secondary | ICD-10-CM | POA: Diagnosis not present

## 2023-02-19 DIAGNOSIS — Z794 Long term (current) use of insulin: Secondary | ICD-10-CM

## 2023-02-19 DIAGNOSIS — E66813 Obesity, class 3: Secondary | ICD-10-CM | POA: Diagnosis not present

## 2023-02-19 NOTE — Telephone Encounter (Signed)
 Left message for patient to call back. Would like to know if she is on farxiga or jardiance? Should only be on one, not both. Jardiance appears to be filled most recently. Slater Duncan, NP wanted to check and make sure.

## 2023-02-19 NOTE — Telephone Encounter (Signed)
 Left voicemail to return call to office

## 2023-02-19 NOTE — Patient Instructions (Addendum)
 Medication Instructions:  Your physician recommends that you continue on your current medications as directed. Please refer to the Current Medication list given to you today.  *If you need a refill on your cardiac medications before your next appointment, please call your pharmacy*  Lab Work: If you have labs (blood work) drawn today and your tests are completely normal, you will receive your results only by: MyChart Message (if you have MyChart) OR A paper copy in the mail If you have any lab test that is abnormal or we need to change your treatment, we will call you to review the results.  Follow-Up: At [facility], you and your health needs are our priority.  As part of our continuing mission to provide you with exceptional heart care, we have created designated Provider Care Teams.  These Care Teams include your primary Cardiologist (physician) and Advanced Practice Providers (APPs -  Physician Assistants and Nurse Practitioners) who all work together to provide you with the care you need, when you need it.  We recommend signing up for the patient portal called MyChart.  Sign up information is provided on this After Visit Summary.  MyChart is used to connect with patients for Virtual Visits (Telemedicine).  Patients are able to view lab/test results, encounter notes, upcoming appointments, etc.  Non-urgent messages can be sent to your provider as well.   To learn more about what you can do with MyChart, go to [email or web address].    Your next appointment:   July with an echocardiogram  Provider:   Dr. Michele Lanius have been referred to pharmacy clinic for weight management.  Other Instructions    1st Floor: - Lobby - Registration  - Pharmacy  - Lab - Cafe  2nd Floor: - PV Lab - Diagnostic Testing (echo, CT, nuclear med)  3rd Floor: - Vacant  4th Floor: - TCTS (cardiothoracic surgery) - AFib Clinic - Structural Heart Clinic - Vascular Surgery  -  Vascular Ultrasound  5th Floor: - HeartCare Cardiology (general and EP) - Clinical Pharmacy for coumadin, hypertension, lipid, weight-loss medications, and med management appointments    Valet parking services will be available as well.

## 2023-02-19 NOTE — Progress Notes (Signed)
 Cardiology Office Note:  .   Date:  02/19/2023  ID:  [name], DOB 1970/07/01, MRN [id number] PCP: Gerome Brunet, DO  [facility] Providers Cardiologist:  None    Patient Profile: .      PMH Coronary artery disease NSTEMI 09/05/2020 >>LHC  Distal apical 70% stenosis LAD (non-culprit, very small caliber) LCx: mLCX 30% with just after a small OM1 branch (non-culprit), prox OM2 with 95% (culprit) >>PCI to OM2 RCA: small caliber PDA with diffuse 60% disease LVEF 50-55% with apical inferolateral hypokinesis OSA on CPAP Hyperlipidemia Hypertension Insulin -dependent DM Stroke in 2011 Morbid obesity  Last cardiology clinic visit was 08/12/2022 with Dr. Michele.  BP was well-controlled.  She did not have any chest pain or other concerning cardiac symptoms.  She was continued on dual antiplatelet therapy due to the size of her stent, no bleeding complications.  He emphasized the importance of secondary prevention with improvement of modifiable risk factors including glycemic control, lipid management, blood pressure control, and weight loss.  She had an episode of gastritis and had discontinued Ozempic .  At the time she was on the highest dose and had unfortunately regained all of her weight and more.  She was encouraged to contact her endocrinologist for reinitiation of Ozempic . Plan for 1 year follow-up with echo prior.        History of Present Illness: Tina [name]   Tina [name] is a very pleasant 53 y.o. female who is here today for preoperative cardiac evaluation for release trigger finger/A1 pulley left ring fincer with Dr. Murrell. She denies any new or worsening symptoms since her last visit with Dr. Michele.  She reports shortness of breath with exertion, specifically when climbing stairs or walking uphill, but this is not a new symptom. She walks her dog around her 2 acre property for exercise and is able to achieve > 4 METS without concerning cardiac symptoms.  She has a history of  weight gain, which she has been struggling to manage despite dietary changes. She tried Ozempic  in the past, but it made her feel sick. She is considering trying a lower dosage or a different weight loss drug, Mounjaro . She also reports frequent urination and a feeling of incomplete bladder emptying. She has been receiving steroid injections for back pain and in her eye, which has caused an increase in her A1c levels. She has stopped the steroid injections in preparation for her upcoming surgeries and has been monitoring her blood sugars weekly. She is currently on insulin  for diabetes management. Reports she is followed by nephrology and kidney function has been stable. No bleeding concerns on Plavix .   Discussed the use of AI scribe software for clinical note transcription with the patient, who gave verbal consent to proceed.   ROS: See HPI       Studies Reviewed: Tina [name]   EKG Interpretation Date/Time:  Thursday February 19 2023 09:04:36 EST Ventricular Rate:  74 PR Interval:  156 QRS Duration:  76 QT Interval:  422 QTC Calculation: 468 R Axis:   47  Text Interpretation: Normal sinus rhythm Septal infarct , age undetermined Nonspecific ST abnormality No significant change since last tracing Confirmed by Percy Browning [phone]) on 02/19/2023 9:17:11 AM     Risk Assessment/Calculations:             Physical Exam:   VS:  BP 126/78   Pulse 74   Ht 5' 6 (1.676 m)   Wt 256 lb 12.8 oz (116.5 kg)  SpO2 98%   BMI 41.45 kg/m    Wt Readings from Last 3 Encounters:  02/19/23 256 lb 12.8 oz (116.5 kg)  01/09/23 255 lb (115.7 kg)  12/19/22 260 lb (117.9 kg)    GEN: Obese, well developed in no acute distress NECK: No JVD; No carotid bruits CARDIAC: RRR, no murmurs, rubs, gallops RESPIRATORY:  Clear to auscultation without rales, wheezing or rhonchi  ABDOMEN: Soft, non-tender, non-distended EXTREMITIES:  No edema; No deformity     ASSESSMENT AND PLAN: .     Preop Cardiac evaluation:  According to the Revised Cardiac Risk Index (RCRI), her Perioperative Risk of Major Cardiac Event is (%): 11. Her Functional Capacity in METs is: 8.42 according to the Duke Activity Status Index (DASI). The patient is doing well from a cardiac perspective. Therefore, based on ACC/AHA guidelines, the patient would be at acceptable risk for the planned procedure without further cardiovascular testing. Per office protocol, she may hold Plavix  for 5 days prior to procedure and should resume as soon as hemodynamically stable postoperatively.  Medication management: She reports she is taking both Farxiga and Jardiance.  Medication reconciliation records indicate only Jardiance has been filled.  We have asked her to ensure that she is not taking both.  CAD without angina: History of PCI as outlined above. She denies chest pain, dyspnea, or other symptoms concerning for angina.  No indication for further ischemic evaluation at this time. No bleeding problems on Plavix . We will continue GDMT including amlodipine , atorvastatin , carvedilol , Plavix , Imdur   Hyperlipidemia LDL goal < 55: Lipid panel completed 01/30/2023 with total cholesterol 136, HDL 44, LDL 59, and triglycerides 799.  She continues to work on [employer] and weight loss.  Continue atorvastatin .  Hypertension: She monitors home BP and reports BP typically 130/80.  BP is well-controlled on current antihypertensive therapy.  Renal function stable on recent labs per report from patient.  No medication changes today.  Type 2 DM/Obesity: A1C 11.6% on 01/30/23. Reports PCP is monitoring this closely. She is interested in retrial of GLP-1 agonist for weight loss.  She would like to meet with one of our pharmacists for further discussion.  We will get her scheduled for an office visit as she may be a candidate for Mounjaro .       Disposition:6 months with Dr. Michele with echo prior  Signed, [name], NP-C

## 2023-02-20 ENCOUNTER — Encounter (HOSPITAL_BASED_OUTPATIENT_CLINIC_OR_DEPARTMENT_OTHER): Payer: Self-pay | Admitting: Anesthesiology

## 2023-02-20 NOTE — Progress Notes (Signed)
 Chart reviewed with Dr. Darlyn, anesthesiologist, including medical history, most recent A1C, and cardiology visit notes. Per aforesaid, patient is not a candidate for surgery at [facility] at this time d/t A1C 11.6 and perioperative cardiac risk of 11%. Erminio, at Dr. Rojean office notified.

## 2023-02-23 NOTE — Telephone Encounter (Signed)
 Patient was returning call. Please advise ?

## 2023-02-23 NOTE — Telephone Encounter (Signed)
 Left message for patient to callback to clarify if she is taking both Jardiance and Farxiga (as was reported at last office visit). Should only be taking one, not both.

## 2023-02-23 NOTE — Telephone Encounter (Signed)
 Patient returned RN's call and stated can reach her at [phone].

## 2023-02-26 NOTE — Telephone Encounter (Signed)
Agree, she should only be on one. As per the last office visit she was on jardiance.   Dr. Odis Hollingshead

## 2023-02-27 DIAGNOSIS — M65342 Trigger finger, left ring finger: Secondary | ICD-10-CM | POA: Diagnosis not present

## 2023-03-03 ENCOUNTER — Telehealth: Payer: PPO

## 2023-03-04 ENCOUNTER — Ambulatory Visit
Admission: RE | Admit: 2023-03-04 | Discharge: 2023-03-04 | Disposition: A | Discharge status: Home / Self Care | Payer: Federal, State, Local not specified - PPO | Source: Ambulatory Visit | Attending: Orthopedic Surgery | Admitting: Orthopedic Surgery

## 2023-03-04 DIAGNOSIS — M545 Low back pain, unspecified: Secondary | ICD-10-CM | POA: Diagnosis not present

## 2023-03-04 DIAGNOSIS — M79604 Pain in right leg: Secondary | ICD-10-CM | POA: Diagnosis not present

## 2023-03-04 MED ORDER — METHYLPREDNISOLONE ACETATE 40 MG/ML INJ SUSP (RADIOLOG: 80.0000 mg | Freq: Once | INTRAMUSCULAR | Status: DC

## 2023-03-05 ENCOUNTER — Encounter (HOSPITAL_BASED_OUTPATIENT_CLINIC_OR_DEPARTMENT_OTHER): Payer: Self-pay

## 2023-03-05 ENCOUNTER — Ambulatory Visit (HOSPITAL_BASED_OUTPATIENT_CLINIC_OR_DEPARTMENT_OTHER): Admit: 2023-03-05 | Payer: PPO | Admitting: Orthopedic Surgery

## 2023-03-05 SURGERY — RELEASE TRIGGER FINGER/A-1 PULLEY
Anesthesia: General | Site: Finger | Laterality: Left

## 2023-03-06 DIAGNOSIS — E1169 Type 2 diabetes mellitus with other specified complication: Secondary | ICD-10-CM | POA: Diagnosis not present

## 2023-03-06 DIAGNOSIS — E1122 Type 2 diabetes mellitus with diabetic chronic kidney disease: Secondary | ICD-10-CM | POA: Diagnosis not present

## 2023-03-06 DIAGNOSIS — E1165 Type 2 diabetes mellitus with hyperglycemia: Secondary | ICD-10-CM | POA: Diagnosis not present

## 2023-03-09 DIAGNOSIS — E113511 Type 2 diabetes mellitus with proliferative diabetic retinopathy with macular edema, right eye: Secondary | ICD-10-CM | POA: Diagnosis not present

## 2023-03-10 DIAGNOSIS — I10 Essential (primary) hypertension: Secondary | ICD-10-CM | POA: Diagnosis not present

## 2023-03-10 DIAGNOSIS — E1140 Type 2 diabetes mellitus with diabetic neuropathy, unspecified: Secondary | ICD-10-CM | POA: Diagnosis not present

## 2023-03-10 DIAGNOSIS — I252 Old myocardial infarction: Secondary | ICD-10-CM | POA: Diagnosis not present

## 2023-03-10 DIAGNOSIS — E7800 Pure hypercholesterolemia, unspecified: Secondary | ICD-10-CM | POA: Diagnosis not present

## 2023-03-10 DIAGNOSIS — E1165 Type 2 diabetes mellitus with hyperglycemia: Secondary | ICD-10-CM | POA: Diagnosis not present

## 2023-03-16 DIAGNOSIS — M545 Low back pain, unspecified: Secondary | ICD-10-CM | POA: Diagnosis not present

## 2023-03-16 DIAGNOSIS — E113512 Type 2 diabetes mellitus with proliferative diabetic retinopathy with macular edema, left eye: Secondary | ICD-10-CM | POA: Diagnosis not present

## 2023-03-25 ENCOUNTER — Ambulatory Visit: Payer: PPO | Admitting: Gastroenterology

## 2023-04-03 DIAGNOSIS — E1169 Type 2 diabetes mellitus with other specified complication: Secondary | ICD-10-CM | POA: Diagnosis not present

## 2023-04-03 DIAGNOSIS — E1165 Type 2 diabetes mellitus with hyperglycemia: Secondary | ICD-10-CM | POA: Diagnosis not present

## 2023-04-03 DIAGNOSIS — E1122 Type 2 diabetes mellitus with diabetic chronic kidney disease: Secondary | ICD-10-CM | POA: Diagnosis not present

## 2023-04-08 ENCOUNTER — Other Ambulatory Visit (HOSPITAL_COMMUNITY): Payer: Self-pay

## 2023-04-08 ENCOUNTER — Ambulatory Visit: Payer: PPO | Attending: Cardiology | Admitting: Pharmacist

## 2023-04-08 ENCOUNTER — Telehealth: Payer: Self-pay | Admitting: Pharmacy Technician

## 2023-04-08 VITALS — Wt 258.0 lb

## 2023-04-08 DIAGNOSIS — E669 Obesity, unspecified: Secondary | ICD-10-CM | POA: Diagnosis not present

## 2023-04-08 NOTE — Telephone Encounter (Signed)
 2 insurances:   Pharmacy Patient Advocate Encounter   Received notification from Physician's Office that prior authorization for Greggory Keen is required/requested.   Insurance verification completed.   The patient is insured through [employer] .   Per test claim: PA required; PA submitted to above mentioned insurance via CoverMyMeds Key/confirmation #/EOC [id number] Status is pending   Pharmacy Patient Advocate Encounter   Received notification from Physician's Office that prior authorization for Greggory Keen  is required/requested.   Insurance verification completed.   The patient is insured through [facility] ADVANTAGE/RX ADVANCE .   Per test claim: PA required; PA submitted to above mentioned insurance via CoverMyMeds Key/confirmation #/EOC b Status is pending

## 2023-04-08 NOTE — Telephone Encounter (Signed)
-----   Message from Olene Floss sent at 04/08/2023 11:24 AM EDT ----- Please do PA for [facility]

## 2023-04-08 NOTE — Telephone Encounter (Signed)
 2 insurances:  Pharmacy Patient Advocate Encounter  Received notification from [facility] that Prior Authorization for Tina [name] has been APPROVED from 03/09/23 to 04/07/24. Ran test claim, Copay is $35.00- one month. This test claim was processed through [facility]- copay amounts may vary at other pharmacies due to pharmacy/plan contracts, or as the patient moves through the different stages of their insurance plan.   PA #/Case ID/Reference #: [id number]

## 2023-04-08 NOTE — Patient Instructions (Addendum)
 [facility] [phone]  GLP-1 Receptor Agonist Counseling Points This medication reduces your appetite and may make you feel fuller longer.  Stop eating when your body tells you that you are full. This will likely happen sooner than you are used to. Fried/greasy food and sweets may upset your stomach - minimize these as much as possible. Store your medication in the fridge until you are ready to use it. Inject your medication in the fatty tissue of your lower abdominal area (2 inches away from belly button) or upper outer thigh. Rotate injection sites. Common side effects include: nausea, diarrhea/constipation, and heartburn, and are more likely to occur if you overeat. Stop your injection for 7 days prior to surgical procedures requiring anesthesia.  Dosing schedule:  We will touch base with you monthly over the phone. The medication can be increased in monthly intervals depending on tolerability and efficacy.  Tips for success: Write down the reasons why you want to lose weight and post it in a place where you'll see it often.  Start small and work your way up. Keep in mind that it takes time to achieve goals, and small steps add up.  Any additional movements help to burn calories. Taking the stairs rather than the elevator and parking at the far end of your parking lot are easy ways to start. Brisk walking for at least 30 minutes 4 or more days of the week is an excellent goal to work toward  Understanding what it means to feel full: Did you know that it can take 15 minutes or more for your brain to receive the message that you've eaten? That means that, if you eat less food, but consume it slower, you may still feel satisfied.  Eating a lot of fruits and vegetables can also help you feel fuller.  Eat off of smaller plates so that moderate portions don't seem too small  Tips for living a healthier life     Building a Healthy and Balanced Diet Make most of your meal  vegetables and fruits -  of your plate. Aim for color and variety, and remember that potatoes don't count as vegetables on the Healthy Eating Plate because of their negative impact on blood sugar.  Go for whole grains -  of your plate. Whole and intact grains--whole wheat, barley, wheat berries, quinoa, oats, brown rice, and foods made with them, such as whole wheat pasta--have a milder effect on blood sugar and insulin than white bread, white rice, and other refined grains.  Protein power -  of your plate. Fish, poultry, beans, and nuts are all healthy, versatile protein sources--they can be mixed into salads, and pair well with vegetables on a plate. Limit red meat, and avoid processed meats such as bacon and sausage.  Healthy plant oils - in moderation. Choose healthy vegetable oils like olive, canola, soy, corn, sunflower, peanut, and others, and avoid partially hydrogenated oils, which contain unhealthy trans fats. Remember that low-fat does not mean "healthy."  Drink water, coffee, or tea. Skip sugary drinks, limit milk and dairy products to one to two servings per day, and limit juice to a small glass per day.  Stay active. The red figure running across the Healthy Eating Plate's placemat is a reminder that staying active is also important in weight control.  The main message of the Healthy Eating Plate is to focus on diet quality:  The type of carbohydrate in the diet is more important than the amount of carbohydrate  in the diet, because some sources of carbohydrate--like vegetables (other than potatoes), fruits, whole grains, and beans--are healthier than others. The Healthy Eating Plate also advises consumers to avoid sugary beverages, a major source of calories--usually with little nutritional value--in the American diet. The Healthy Eating Plate encourages consumers to use healthy oils, and it does not set a maximum on the percentage of calories people should get each day from  healthy sources of fat. In this way, the Healthy Eating Plate recommends the opposite of the low-fat message promoted for decades by the USDA.  [employer].ee  SUGAR  Sugar is a huge problem in the modern day diet. Sugar is a big contributor to heart disease, diabetes, high triglyceride levels, fatty liver disease and obesity. Sugar is hidden in almost all packaged foods/beverages. Added sugar is extra sugar that is added beyond what is naturally found and has no nutritional benefit for your body. The American Heart Association recommends limiting added sugars to no more than 25g for women and 36 grams for men per day. There are many names for sugar including maltose, sucrose (names ending in "ose"), high fructose corn syrup, molasses, cane sugar, corn sweetener, raw sugar, syrup, honey or fruit juice concentrate.   One of the best ways to limit your added sugars is to stop drinking sweetened beverages such as soda, sweet tea, and fruit juice.  There is 65g of added sugars in one 20oz bottle of Coke! That is equal to 7.5 donuts.   Pay attention and read all nutrition facts labels. Below is an examples of a nutrition facts label. The #1 is showing you the total sugars where the # 2 is showing you the added sugars. This one serving has almost the max amount of added sugars per day!   EXERCISE  Exercise is good. We've all heard that. In an ideal world, we would all have time and resources to get plenty of it. When you are active, your heart pumps more efficiently and you will feel better.  Multiple studies show that even walking regularly has benefits that include living a longer life. The American Heart Association recommends 150 minutes per week of exercise (30 minutes per day most days of the week). You can do this in any increment you wish. Nine or more 10-minute walks count. So does an hour-long exercise class. Break the time apart into what will  work in your life. Some of the best things you can do include walking briskly, jogging, cycling or swimming laps. Not everyone is ready to "exercise." Sometimes we need to start with just getting active. Here are some easy ways to be more active throughout the day:  Take the stairs instead of the elevator  Go for a 10-15 minute walk during your lunch break (find a friend to make it more enjoyable)  When shopping, park at the back of the parking lot  If you take public transportation, get off one stop early and walk the extra distance  Pace around while making phone calls  Check with your doctor if you aren't sure what your limitations may be. Always remember to drink plenty of water when doing any type of exercise. Don't feel like a failure if you're not getting the 90-150 minutes per week. If you started by being a couch potato, then just a 10-minute walk each day is a huge improvement. Start with little victories and work your way up.   HEALTHY EATING TIPS  Plan ahead: make a menu of the meals for a week then create a grocery list to go with that menu. Consider meals that easily stretch into a night of leftovers, such as stews or casseroles. Or consider making two of your favorite meal and put one in the freezer for another night. Try a night or two each week that is "meatless" or "no cook" such as salads. When you get home from the grocery store wash and prepare your vegetables and fruits. Then when you need them they are ready to go.   Tips for going to the grocery store:  Buy store or generic brands  Check the weekly ad from your store on-line or in their in-store flyer  Look at the unit price on the shelf tag to compare/contrast the costs of different items  Buy fruits/vegetables in season  Carrots, bananas and apples are low-cost, naturally healthy items  If meats or frozen vegetables are on sale, buy some extras and put in your freezer  Limit buying prepared or "ready to eat"  items, even if they are pre-made salads or fruit snacks  Do not shop when you're hungry  Foods at eye level tend to be more expensive. Look on the high and low shelves for deals.  Consider shopping at the farmer's market for fresh foods in season.  Avoid the cookie and chip aisles (these are expensive, high in calories and low in nutritional value). Shop on the outside of the grocery store.  Healthy food preparations:  If you can't get lean hamburger, be sure to drain the fat when cooking  Steam, saut (in olive oil), grill or bake foods  Experiment with different seasonings to avoid adding salt to your foods. Kosher salt, sea salt and Himalayan salt are all still salt and should be avoided. Try seasoning food with onion, garlic, thyme, rosemary, basil ect. Onion powder or garlic powder is ok. Avoid if it says salt (ie garlic salt).

## 2023-04-08 NOTE — Progress Notes (Signed)
 Patient ID: [name]                 DOB: 1970-03-20                    MRN: [id number]      HPI: Tina [name] is a 53 y.o. female patient referred to pharmacy clinic by Eligha Bridegroom, NP to initiate weight loss therapy with GLP1-RA. PMH is significant for obesity complicated by chronic medical conditions including diabetes, CAD, hypertension, stroke. Most recent BMI 41.6.   Patient presents today accompanied by her husband.  She was previously on Ozempic but had diarrhea with the 2 mg dose.  No issues with the lower doses.  States the diarrhea went away within a month and she has had no issues with diarrhea since.  Blood sugars are not controlled.  Generally wears a CGM however not currently wearing she bumped it and it fell off twice.  Waiting for shipment of more sensors.  Reports her fasting blood sugar generally 1 40-2 25.  Postprandial around 250.  She only eats about 1-2 meals a day recently.  Will cook dinner for her family but does not always eat.  Has been craving cookout grilled spicy chicken sandwiches and fries recently.   Current weight management medications: 258lb, 41.64  Previously tried meds: Ozempic 2 mg  Current meds that may affect weight: Insulin  Baseline weight/BMI: 258 pounds, 41.64  Insurance payor: [employer] primary, [employer] secondary  Diet:  1-2 meals per day Cookout grilled chicken sandwich, fries  vegetable egg rolls  [place]- bacon egg and cheese on english muffin  -Breakfast: -Lunch: -Dinner: spagetti, meatloaf (but doesn't always eat), chicken rice, quina -Snacks:ding dongs, vegetable egg rolls  -Drinks: water, stopped soda, sweet tea every now and then  Exercise:   Family History:  Family History  Problem Relation Age of Onset   Diabetes Father    Kidney disease Father    Depression Father    Drug abuse Father    Allergic rhinitis Mother    Eczema Mother    Urticaria Mother     Depression Mother    Anxiety disorder Mother    Bipolar disorder Mother    Alcoholism Mother    Drug abuse Mother    Eating disorder Mother    Diabetes Maternal Grandmother    Hyperlipidemia Paternal Grandmother    Stroke Paternal Grandmother    Eczema Sister    Urticaria Sister    Colon cancer Paternal Uncle    Other Neg Hx    Angioedema Neg Hx    Asthma Neg Hx    Colon polyps Neg Hx    Esophageal cancer Neg Hx    Rectal cancer Neg Hx    Stomach cancer Neg Hx      Social History: wine causal with friends, no tobacco  Labs: Lab Results  Component Value Date   HGBA1C 8.4 (H) 08/13/2020    Wt Readings from Last 1 Encounters:  02/19/23 256 lb 12.8 oz (116.5 kg)    BP Readings from Last 1 Encounters:  02/19/23 126/78   Pulse Readings from Last 1 Encounters:  02/19/23 74       Component Value Date/Time   CHOL 175 09/06/2020 0254   CHOL 169 02/15/2018 1034   TRIG 137 09/06/2020 0254   HDL 56 09/06/2020 0254   HDL 41 02/15/2018 1034   CHOLHDL 3.1 09/06/2020 0254   VLDL 27 09/06/2020  0254   LDLCALC 92 09/06/2020 0254   LDLCALC 94 02/15/2018 1034    Past Medical History:  Diagnosis Date   Allergy    Anemia    DURING MENSES--HAS HEAVY BLEEDING WITH PERODS   Angioedema 08/13/2020   Anxiety    Back pain, chronic    "ongoing"   Blood transfusion    IN 2012  AFTER C -SECTION   Cerebral thrombosis with cerebral infarction (HCC) 06/2009   RIGHT SIDED WEAKNESS ( ARM AND LEG ) AND SPASMS-remains with slight weakness and vertigo.   Constipation    Depression    Diabetes mellitus    Diabetic neuropathy (HCC)    BOTH FEET --COMES AND GOES   Edema, lower extremity    Endometriosis    Fatty liver    GERD (gastroesophageal reflux disease)    with pregnancy   H/O eye surgery    Headache(784.0)    MIGRAINES--NOT REALLY HEADACHE-MORE LIKE PRESSURE SENSATION IN HEAD-FEELS DIZZIY AND  FAINT AS THE PRESSURE RESOLVES   Hernia, incisional, RLQ, s/p lap repair Sep 2013  09/02/2011   History of vertigo 03/22/2018   Hx of migraines 10/19/2011   Hyperlipidemia    Hypertension    Leg pain, right    "like bad Charley horse"   Multiple food allergies    Panic disorder without agoraphobia    Rash    HANDS, ARMS --STATES HX OF RASH EVER SINCE CHILDBIRTH/PREGNANCY.  STATES THE RASH OFTEN OCCURS WHEN SHE IS REALLY STRESSED."goes and comes-presently left ring finger"   Restless leg syndrome    DIAGNOSED BY SLEEP STUDY - PT TOLD SHE DID NOT HAVE SLEEP APNEA   Right rotator cuff tear    PAIN IN RIGHT SHOULDER   SBO (small bowel obstruction) (HCC) 06/03/2012   Shortness of breath    Spastic hemiplegia affecting dominant side (HCC)    Stomach problems    Stroke (HCC)    Ventral hernia    RIGHT LOWER QUADRANT-CAUSING SOME PAIN   Weakness of right side of body     Current Outpatient Medications on File Prior to Visit  Medication Sig Dispense Refill   albuterol (VENTOLIN HFA) 108 (90 Base) MCG/ACT inhaler Inhale 2 puffs into the lungs every 4 (four) hours as needed for wheezing or shortness of breath. 18 g 1   allopurinol (ZYLOPRIM) 100 MG tablet Take 100 mg by mouth daily.     amLODipine (NORVASC) 5 MG tablet Take 1 tablet (5 mg total) by mouth daily. 90 tablet 0   atorvastatin (LIPITOR) 40 MG tablet Take 1 tablet (40 mg total) by mouth at bedtime. 90 tablet 0   carvedilol (COREG) 25 MG tablet Take 1 tablet by mouth 2 (two) times daily with a meal.     cetirizine (ZYRTEC) 10 MG tablet Take 10 mg by mouth daily as needed for allergies (itching).     clopidogrel (PLAVIX) 75 MG tablet TAKE ONE TABLET BY MOUTH ONCE DAILY 30 tablet 0   colchicine 0.6 MG tablet Take 0.6 mg by mouth daily as needed.     Continuous Blood Gluc Sensor (FREESTYLE LIBRE 2 SENSOR) MISC Inject 1 Device into the skin every 14 (fourteen) days.     DULoxetine (CYMBALTA) 20 MG capsule Take 20-40 mg by mouth See admin instructions. Take 20 mg by mouth in the morning and 40 mg at bedtime      empagliflozin (JARDIANCE) 25 MG TABS tablet 1 tablet     EPINEPHrine (AUVI-Q) 0.3 mg/0.3 mL IJ SOAJ  injection Inject 0.3 mg into the muscle as needed for anaphylaxis. 1 each 1   famotidine (PEPCID) 20 MG tablet Take 1 tablet by mouth 2 (two) times daily.     furosemide (LASIX) 40 MG tablet Take 40 mg by mouth in the morning.     gabapentin (NEURONTIN) 300 MG capsule Take 300 mg by mouth at bedtime.     hydrALAZINE (APRESOLINE) 10 MG tablet TAKE ONE TABLET BY MOUTH THREE TIMES DAILY 90 tablet 0   Insulin Aspart FlexPen (NOVOLOG) 100 UNIT/ML Inject 0-25 Units into the skin 3 (three) times daily before meals. Dose per sliding scale.     Insulin Glargine (BASAGLAR KWIKPEN) 100 UNIT/ML Inject 22 Units into the skin at bedtime.     isosorbide mononitrate (IMDUR) 60 MG 24 hr tablet TAKE 1 TABLET BY MOUTH ONCE DAILY 30 tablet 11   JANUVIA 25 MG tablet Take 25 mg by mouth daily.     pantoprazole (PROTONIX) 40 MG tablet Take 1 tablet (40 mg total) by mouth daily. Please schedule an office visit for further refills 30 tablet 1   spironolactone (ALDACTONE) 25 MG tablet Take 25 mg by mouth daily.     tiZANidine (ZANAFLEX) 2 MG tablet TAKE 1 TABLET BY MOUTH EVERY DAY AT BEDTIME 30 tablet 10   traMADol (ULTRAM) 50 MG tablet Take 1 tablet (50 mg total) by mouth 2 (two) times daily. 60 tablet 5   [DISCONTINUED] diphenhydrAMINE (BENADRYL) 25 MG tablet Take 1 tablet (25 mg total) by mouth every 8 (eight) hours as needed for up to 2 days for itching.     No current facility-administered medications on file prior to visit.    Allergies  Allergen Reactions   Amlodipine Benzoate Swelling and Other (See Comments)    Leg swelling Other reaction(s): leg swelling Other reaction(s): ? leg swelling at 10 mg dose Other reaction(s): ? leg swelling at 10 mg dose Other reaction(s): ? leg swelling at 10 mg dose   Contrast Media [Iodinated Contrast Media] Shortness Of Breath and Other (See Comments)    Difficulty breathing    Iodine Anaphylaxis    Other reaction(s): Unknown Other reaction(s): Unknown Other reaction(s): Unknown Other reaction(s): Unknown   Iohexol Hives, Nausea And Vomiting, Swelling and Other (See Comments)     Desc: Magnevist-gadolinium-difficulty breathing, throat swelling    Midazolam Hcl Anaphylaxis    Difficulty breathing   Other Shortness Of Breath, Itching and Other (See Comments)    Patient is allergic to all nuts except peanuts, which are NOT "nuts"- they are legumes   Shellfish Allergy Anaphylaxis   Shellfish-Derived Products Anaphylaxis and Other (See Comments)    Other reaction(s): Unknown   Valsartan Swelling    Other reaction(s): Unknown Other reaction(s): Unknown Other reaction(s): angioedema Other reaction(s): angioedema   Metformin And Related Diarrhea, Nausea And Vomiting and Other (See Comments)    Dehydration, also   [place] [Dapagliflozin] Nausea Only and Other (See Comments)    UTI, headaches and muscle aches, also   Hydralazine Other (See Comments)    Reaction not recalled Other reaction(s): Unknown Other reaction(s): Unknown Other reaction(s): Unknown Other reaction(s): Unknown   Saxagliptin Hives, Nausea And Vomiting and Other (See Comments)    ONGLYZA- dehydration, also Other reaction(s): Unknown Other reaction(s): Unknown Other reaction(s): Unknown Other reaction(s): Unknown   Spironolactone Other (See Comments)    Elevated potassium- Severe hyperkalemia Other reaction(s): severe hyperkalemia Other reaction(s): severe hyperkalemia Other reaction(s): severe hyperkalemia   Avandia [Rosiglitazone Maleate] Hives and Other (  See Comments)   Geodon [Ziprasidone] Other (See Comments)    Reaction not recalled   Kiwi Extract Itching and Swelling   Latex Itching    Other reaction(s): Unknown Other reaction(s): Unknown     Assessment/Plan:  1. Weight loss - Patient has not met goal of at least 5% of body weight loss with comprehensive lifestyle  modifications alone in the past 3-6 months. Pharmacotherapy is appropriate to pursue as augmentation. Will start Mounjaro 2.5 mg weekly.  Due to history of diarrhea, will consider a slower titration if needed.  Confirmed patient not pregnant and no personal or family history of medullary thyroid carcinoma (MTC) or Multiple Endocrine Neoplasia syndrome type 2 (MEN 2).  No history of gallstones or pancreatitis.  Injection technique reviewed with patient.  At this time since blood sugars are not controlled no need to decrease insulin but will need to get close eye on this.  Advised patient on common side effects including nausea, diarrhea, dyspepsia, decreased appetite, and fatigue. Counseled patient on reducing meal size and how to titrate medication to minimize side effects. Counseled patient to call if intolerable side effects or if experiencing dehydration, abdominal pain, or dizziness. Patient will adhere to dietary modifications and will target at least 150 minutes of moderate intensity exercise weekly.   She was encouraged to set small goals for exercise.  Suggest that she start with a goal of walking 2 days a week.  Try to do this at a time where it becomes habit, for example after breakfast.  Also encouraged some resistance training.  Handout given on this.  Reminded patient that medication does not distinguish between fat and muscle and we want a make sure that she does not lose too much muscle mass.  Encouraged her to incorporate more fruits and vegetables into her diet.  Encouraged her to decrease fried fatty foods especially fast food.  Reminded her that these foods can increase the risk of diarrhea, are also high caloric foods that can lead to weight gain as well, and are not healthy for our heart.  Will submit prior authorization for [facility] since she did not tolerate 2 mg of Ozempic.  Follow up in 1 month via telephone.  Will need to assess blood sugars at that time and determine if  adjustments in insulin are needed.  Olene Floss, Pharm.D, BCACP, CPP [facility] [address], [place], [place] [address]  Phone: [phone]; Fax: [phone]

## 2023-04-10 ENCOUNTER — Other Ambulatory Visit: Payer: Self-pay

## 2023-04-10 ENCOUNTER — Other Ambulatory Visit (HOSPITAL_COMMUNITY): Payer: Self-pay

## 2023-04-10 MED ORDER — MOUNJARO 2.5 MG/0.5ML ~~LOC~~ SOAJ
2.5000 mg | SUBCUTANEOUS | 0 refills | Status: DC
  Filled 2023-04-10: qty 2, 28d supply, fill #0

## 2023-04-10 NOTE — Telephone Encounter (Signed)
 Called pt and LVM to call back Tina [name] has been approved. Rx sent to [facility] for delivery Patient will need to stop Januvia before starting Mounjaro- I did foreget to mention this at apt.

## 2023-04-10 NOTE — Telephone Encounter (Signed)
 2 insurances - $0.00 copay for one month and $25.00 for 3 months  Pharmacy Patient Advocate Encounter  Received notification from [facility] that Prior Authorization for Greggory Keen has been APPROVED from 03/09/23 to 04/07/24   PA #/Case ID/Reference #:  [id number]     Pharmacy Patient Advocate Encounter  Received notification from [facility] ADVANTAGE/RX ADVANCE that Prior Authorization for mounjaro has been APPROVED from 04/09/23 to 04/08/24   PA #/Case ID/Reference #: [id number]

## 2023-04-10 NOTE — Addendum Note (Signed)
 Addended by: Malena Peer D on: 04/10/2023 08:54 AM   Modules accepted: Orders

## 2023-04-13 NOTE — Telephone Encounter (Signed)
 I spoke with patient- she is aware to stop Januvia Will follow up in 3-4 weeks

## 2023-04-22 DIAGNOSIS — E113512 Type 2 diabetes mellitus with proliferative diabetic retinopathy with macular edema, left eye: Secondary | ICD-10-CM | POA: Diagnosis not present

## 2023-04-28 NOTE — Op Note (Signed)
 Tina [name] 01/09/2023 Diagnosis: Endophthalmitis left eye  Procedure:  Intravitreal injection of antibiotics  and tap for culture Operative Eye:  left eye  Surgeon: Jacelyn Martinez Estimated Blood Loss: minimal Specimens for Pathology:  None Complications: none   The  patient was prepped and draped in the usual fashion for intravitreal injection of the  left eye in the procedure room.  A lid speculum was placed.  Subconjunctival lidocaine was placed temporally.  Topical betadine was placed at the injection site and a vitreous tap was performed removing approximately 0.2 cc of vitreous fluid for culture and stain.  2.25mg  of Ceftazadime and 1 mg of Vancomycin were placed in the eye.  Betadine was again placed over the injection site.  The speculum was removed.  The patient tolerated the procedure well.  Jacelyn Martinez MD

## 2023-05-04 DIAGNOSIS — E1169 Type 2 diabetes mellitus with other specified complication: Secondary | ICD-10-CM | POA: Diagnosis not present

## 2023-05-04 DIAGNOSIS — E1122 Type 2 diabetes mellitus with diabetic chronic kidney disease: Secondary | ICD-10-CM | POA: Diagnosis not present

## 2023-05-04 DIAGNOSIS — E1165 Type 2 diabetes mellitus with hyperglycemia: Secondary | ICD-10-CM | POA: Diagnosis not present

## 2023-05-07 ENCOUNTER — Telehealth: Payer: Self-pay | Admitting: Pharmacist

## 2023-05-07 NOTE — Telephone Encounter (Signed)
 Called to f/u on Mounjaro  tolerability and possible titration. LVM for pt to call back.

## 2023-05-08 ENCOUNTER — Encounter (HOSPITAL_COMMUNITY): Payer: Self-pay | Admitting: Ophthalmology

## 2023-05-08 MED ORDER — VANCOMYCIN INTRAVITREAL INJECTION 1 MG/0.1 ML
INTRAOCULAR | Status: AC | PRN
  Administered 2023-01-09: 1 mg via INTRAVITREAL

## 2023-05-08 MED ORDER — CEFTAZIDIME INTRAVITREAL INJECTION 2.25 MG/0.1 ML
INTRAVITREAL | Status: AC | PRN
  Administered 2023-01-09: 2.25 mg via INTRAVITREAL

## 2023-05-11 ENCOUNTER — Other Ambulatory Visit: Payer: Self-pay

## 2023-05-11 ENCOUNTER — Other Ambulatory Visit (HOSPITAL_COMMUNITY): Payer: Self-pay

## 2023-05-11 MED ORDER — MOUNJARO 5 MG/0.5ML ~~LOC~~ SOAJ
5.0000 mg | SUBCUTANEOUS | 0 refills | Status: DC
  Filled 2023-05-11 – 2023-05-18 (×2): qty 2, 28d supply, fill #0

## 2023-05-11 NOTE — Telephone Encounter (Signed)
 20 units at night, 10 in AM Fasting BG  98-120 has been as low as 65 15-25 units of short acting Low BG at least 3 times  Decrease long acting 16 units at night and 10 in AM Decrease SSI to 10-20 units  Increase Mounjaro  to 5mg  weekly

## 2023-05-12 ENCOUNTER — Other Ambulatory Visit: Payer: Self-pay

## 2023-05-12 ENCOUNTER — Encounter: Payer: Self-pay | Admitting: Pharmacist

## 2023-05-12 NOTE — Telephone Encounter (Signed)
 Nou, a reg nurse case mgr from [employer] ([phone]) calling to confirm if pt Is supposed to still be taking Januvia. Pt reports to her that she is still taking. Please reach out to pt to discuss

## 2023-05-12 NOTE — Telephone Encounter (Signed)
 Left voicemail to return call to office

## 2023-05-12 NOTE — Telephone Encounter (Signed)
 Spoke with Nou Nurse Manager and she was confirming if patient is supposed to be taking Januvia. Per pharmD note patient is supposed to have stopped Januvia 4 weeks ago.   Nou states patient advised she was still taking Januvia .  Tried to call patient LVM to return call to office.

## 2023-05-15 ENCOUNTER — Other Ambulatory Visit: Payer: Self-pay

## 2023-05-15 DIAGNOSIS — I10 Essential (primary) hypertension: Secondary | ICD-10-CM | POA: Diagnosis not present

## 2023-05-15 DIAGNOSIS — E1165 Type 2 diabetes mellitus with hyperglycemia: Secondary | ICD-10-CM | POA: Diagnosis not present

## 2023-05-18 ENCOUNTER — Other Ambulatory Visit: Payer: Self-pay

## 2023-05-18 ENCOUNTER — Other Ambulatory Visit (HOSPITAL_COMMUNITY): Payer: Self-pay

## 2023-05-21 ENCOUNTER — Telehealth: Payer: Self-pay | Admitting: Cardiology

## 2023-05-21 DIAGNOSIS — R609 Edema, unspecified: Secondary | ICD-10-CM

## 2023-05-21 DIAGNOSIS — I5032 Chronic diastolic (congestive) heart failure: Secondary | ICD-10-CM

## 2023-05-21 NOTE — Telephone Encounter (Signed)
 Left message to call back

## 2023-05-21 NOTE — Telephone Encounter (Signed)
 Left a detailed message regarding the recommendations from Dr. Albert Huff per pt's DPR on file. Pt told to call our office to verify she got the message and understood the instructions. Order for lab work has been placed and released in the system.

## 2023-05-21 NOTE — Telephone Encounter (Signed)
 Spoke with patient and she states she has gained 8 lbs in 3 days. She has swelling from her knees down to her feet. No chest pain or SOB. No changes to diet. Her med list is correct  Informed patient she can try elevating her legs and feet, trying compression stockings,  and monitor salt intake. Will forward to MD

## 2023-05-21 NOTE — Telephone Encounter (Signed)
 Check BMP,NTproBNP, and Mg. Today.  Can increase lasix  40mg  po qday to bid for TODAY only until the labs come back.  Monitor her BP as well.  Darreon Lutes Staunton, DO, [facility]

## 2023-05-21 NOTE — Telephone Encounter (Signed)
 Pt c/o swelling/edema: STAT if pt has developed SOB within 24 hours  If swelling, where is the swelling located?   Feet and legs  How much weight have you gained and in what time span?   8 pounds in 3 days  Have you gained 2 pounds in a day or 5 pounds in a week?   8 pounds in 3 days  Do you have a log of your daily weights (if so, list)?   Yes  Are you currently taking a fluid pill?   Yes  Are you currently SOB?   No  Have you traveled recently in a car or plane for an extended period of time?   No  Patient is concerned she has gained 8 pounds in 3 days and her blood pressure has increased to the highest was 191/101 and last night around 9:00 pm her BP was 154/80.  Patient wants advice on next steps.

## 2023-05-21 NOTE — Telephone Encounter (Signed)
 Patient is returning call.

## 2023-05-22 DIAGNOSIS — I5032 Chronic diastolic (congestive) heart failure: Secondary | ICD-10-CM | POA: Diagnosis not present

## 2023-05-22 DIAGNOSIS — R609 Edema, unspecified: Secondary | ICD-10-CM | POA: Diagnosis not present

## 2023-05-22 DIAGNOSIS — M545 Low back pain, unspecified: Secondary | ICD-10-CM | POA: Diagnosis not present

## 2023-05-23 LAB — BASIC METABOLIC PANEL WITH GFR
BUN/Creatinine Ratio: 11 (ref 9–23)
BUN: 21 mg/dL (ref 6–24)
CO2: 24 mmol/L (ref 20–29)
Calcium: 9.6 mg/dL (ref 8.7–10.2)
Chloride: 106 mmol/L (ref 96–106)
Creatinine, Ser: 1.83 mg/dL — ABNORMAL HIGH (ref 0.57–1.00)
Glucose: 44 mg/dL — ABNORMAL LOW (ref 70–99)
Potassium: 3.9 mmol/L (ref 3.5–5.2)
Sodium: 143 mmol/L (ref 134–144)
eGFR: 33 mL/min/{1.73_m2} — ABNORMAL LOW (ref >59)

## 2023-05-23 LAB — MAGNESIUM: Magnesium: 2.1 mg/dL (ref 1.6–2.3)

## 2023-05-23 LAB — PRO B NATRIURETIC PEPTIDE: NT-Pro BNP: 99 pg/mL (ref 0–249)

## 2023-05-27 DIAGNOSIS — E113512 Type 2 diabetes mellitus with proliferative diabetic retinopathy with macular edema, left eye: Secondary | ICD-10-CM | POA: Diagnosis not present

## 2023-05-31 ENCOUNTER — Ambulatory Visit: Payer: Self-pay | Admitting: Cardiology

## 2023-06-03 DIAGNOSIS — E1122 Type 2 diabetes mellitus with diabetic chronic kidney disease: Secondary | ICD-10-CM | POA: Diagnosis not present

## 2023-06-03 DIAGNOSIS — E1169 Type 2 diabetes mellitus with other specified complication: Secondary | ICD-10-CM | POA: Diagnosis not present

## 2023-06-03 DIAGNOSIS — E1165 Type 2 diabetes mellitus with hyperglycemia: Secondary | ICD-10-CM | POA: Diagnosis not present

## 2023-06-05 ENCOUNTER — Telehealth: Payer: Self-pay | Admitting: Pharmacist

## 2023-06-05 NOTE — Telephone Encounter (Signed)
 Called and LVM to follow up on how she is doing on Mounjaro . Will need to assess if we can increase and if her insulin  needs to be adjusted

## 2023-06-10 DIAGNOSIS — E113511 Type 2 diabetes mellitus with proliferative diabetic retinopathy with macular edema, right eye: Secondary | ICD-10-CM | POA: Diagnosis not present

## 2023-06-18 ENCOUNTER — Other Ambulatory Visit (HOSPITAL_COMMUNITY): Payer: Self-pay

## 2023-06-18 ENCOUNTER — Other Ambulatory Visit: Payer: Self-pay

## 2023-06-18 MED ORDER — MOUNJARO 7.5 MG/0.5ML ~~LOC~~ SOAJ
7.5000 mg | SUBCUTANEOUS | 0 refills | Status: DC
  Filled 2023-06-18: qty 2, 28d supply, fill #0

## 2023-06-18 NOTE — Telephone Encounter (Signed)
 Spoke to patient about Monjaro. BG is doing well. Weight is at a stand still. Not much appetite Morning BG 110 Middle of day 170 if she doesn't eat 15 units of novolg with meals Keeps forgetting to give long acting but fasting still around 110- has forgotten for the last month  Plan: Use 10 units of glargine daily Increase mounjaro  to 7.5 Decrease novolog  to 10-15 units with meals Follow up in 3 weeks

## 2023-06-19 ENCOUNTER — Encounter: Payer: Self-pay | Admitting: Physical Medicine & Rehabilitation

## 2023-06-19 ENCOUNTER — Encounter: Payer: PPO | Attending: Physical Medicine & Rehabilitation | Admitting: Physical Medicine & Rehabilitation

## 2023-06-19 VITALS — BP 143/84 | HR 81 | Ht 66.0 in | Wt 248.0 lb

## 2023-06-19 DIAGNOSIS — M533 Sacrococcygeal disorders, not elsewhere classified: Secondary | ICD-10-CM | POA: Insufficient documentation

## 2023-06-19 MED ORDER — TRAMADOL HCL 50 MG PO TABS: 50.0000 mg | ORAL_TABLET | Freq: Two times a day (BID) | ORAL | 5 refills | Status: DC

## 2023-06-19 NOTE — Progress Notes (Signed)
 Subjective:    Patient ID: Tina [name], female    DOB: February 10, 1970, 53 y.o.   MRN: [id number]  HPI 53 year old female with history of bilateral pontine strokes and residual right-sided weakness.  She also has a history of chronic low back pain related to sacroiliac disorder which has been  Aquatic exercise  Also walks around 3 acre yard with dogs twice a day   Was to get SI fusion but HgA1C too high Pain Inventory Average Pain 8 Pain Right Now 8 My pain is sharp, stabbing, tingling, and aching  In the last 24 hours, has pain interfered with the following? General activity 8 Relation with others 8 Enjoyment of life 8 What TIME of day is your pain at its worst? daytime, evening, and night Sleep (in general) Poor  Pain is worse with: walking, bending, sitting, and standing Pain improves with: . Relief from Meds: .  Family History  Problem Relation Age of Onset   Diabetes Father    Kidney disease Father    Depression Father    Drug abuse Father    Allergic rhinitis Mother    Eczema Mother    Urticaria Mother    Depression Mother    Anxiety disorder Mother    Bipolar disorder Mother    Alcoholism Mother    Drug abuse Mother    Eating disorder Mother    Diabetes Maternal Grandmother    Hyperlipidemia Paternal Grandmother    Stroke Paternal Grandmother    Eczema Sister    Urticaria Sister    Colon cancer Paternal Uncle    Other Neg Hx    Angioedema Neg Hx    Asthma Neg Hx    Colon polyps Neg Hx    Esophageal cancer Neg Hx    Rectal cancer Neg Hx    Stomach cancer Neg Hx    Social History   Socioeconomic History   Marital status: Married    Spouse name: Mara [place]in[place]   Number of children: 3   Years of education: BA degree   Highest education level: Not on file  Occupational History   Occupation: stay at home mom    Employer: UNEMPLOYED  Tobacco Use   Smoking status: Never   Smokeless tobacco: Never  Vaping Use   Vaping status: Never Used  Substance and  Sexual Activity   Alcohol use: No    Alcohol/week: 0.0 standard drinks of alcohol   Drug use: Not Currently   Sexual activity: Yes    Birth control/protection: None  Other Topics Concern   Not on file  Social History Narrative   Patient is married with 2 children.   Patient is right handed.   Patient has her  BA degree.   Patient drinks 1 cup daily.   Social Drivers of Corporate investment banker Strain: Not on file  Food Insecurity: Low Risk  (02/10/2023)   Received from [facility]   Hunger Vital Sign    Worried About Running Out of Food in the Last Year: Never true    Ran Out of Food in the Last Year: Never true  Transportation Needs: No Transportation Needs (02/10/2023)   Received from [facility]    In the past 12 months, has lack of reliable transportation kept you from medical appointments, meetings, work or from getting things needed for daily living? : No  Physical Activity: Not on file  Stress: Not on file  Social Connections: Not on file   Past  Surgical History:  Procedure Laterality Date   APPLICATION OF WOUND VAC N/A 05/25/2015   Procedure: APPLICATION OF WOUND VAC;  Surgeon: [name] Champagne, MD;  Location: [facility];  Service: General;  Laterality: N/A;   CARDIAC CATHETERIZATION     CESAREAN SECTION  2012   COLONOSCOPY     CORONARY STENT INTERVENTION N/A 09/05/2020   Procedure: CORONARY STENT INTERVENTION;  Surgeon: Cody Das, MD;  [facility]: [facility];  Service: Cardiovascular;  Laterality: N/A;   CORONARY ULTRASOUND/IVUS N/A 09/05/2020   Procedure: Intravascular Ultrasound/IVUS;  Surgeon: Cody Das, MD;  Location: [facility];  Service: Cardiovascular;  Laterality: N/A;   DIAGNOSTIC LAPAROSCOPY     ESOPHAGOGASTRODUODENOSCOPY N/A 06/03/2012   Procedure: ESOPHAGOGASTRODUODENOSCOPY (EGD);  Surgeon: Tami Falcon, MD;  Location: [facility] ENDOSCOPY;  Service: Endoscopy;  Laterality: N/A;   EXCISION MASS ABDOMINAL N/A  05/25/2015   Procedure: ABDOMINAL WALL EXPLORATION EXCISION OF SEROMA REMOVAL OF REDUNDANT SKIN ;  Surgeon: [name] Champagne, MD;  Location: [facility];  Service: General;  Laterality: N/A;   EYE SURGERY     Eye laser for vessel hemorrhaging   fybroid removal     HERNIA REPAIR  10/03/2011   ventral hernia repair   INJECTION, INTRAVITREAL, THERAPEUTIC AGENT, EYE Left 01/09/2023   Procedure: Injection, Intravitreal, therapeutic agent, eye;  Surgeon: Jearline Minder, MD;  Location[facility] OR;  Service: Ophthalmology;  Laterality: Left;   INSERTION OF MESH N/A 02/04/2013   Procedure: INSERTION OF MESH;  Surgeon: Eddye Goodie, MD;  Location: [facility];  Service: General;  Laterality: N/A;   LEFT HEART CATH AND CORONARY ANGIOGRAPHY N/A 09/05/2020   Procedure: LEFT HEART CATH AND CORONARY ANGIOGRAPHY;  Surgeon: Cody Das, MD;  [facility];  Service: Cardiovascular;  Laterality: N/A;   PARS PLANA VITRECTOMY Left 08/24/2022   Procedure: PARS PLANA VITRECTOMY 25 GAUGE FOR ENDOPHTHALMITIS; INJECTION OF ANTIBIOTIC;  Surgeon: Jearline Minder, MD;  Location: [facility] OR;  Service: Ophthalmology;  Laterality: Left;   UMBILICAL HERNIA REPAIR N/A 02/04/2013   Procedure: LAPAROSCOPIC ventral wall hernia repair LAPAROSCOPIC LYSIS OF ADHESIONS laparoscopic exploration of abdomen ;  Surgeon: Eddye Goodie, MD;  Location: [facility];  Service: General;  Laterality: N/A;   UPPER GASTROINTESTINAL ENDOSCOPY     URETER REVISION     Bilateral "twisted"   UTERINE FIBROID SURGERY     2 SURGERIES FOR FIBROIDS   VENTRAL HERNIA REPAIR  10/03/2011   Procedure: LAPAROSCOPIC VENTRAL HERNIA;  Surgeon: Eddye Goodie, MD;  Location: [facility];  Service: General;  Laterality: N/A;   Past Surgical History:  Procedure Laterality Date   APPLICATION OF WOUND VAC N/A 05/25/2015   Procedure: APPLICATION OF WOUND VAC;  Surgeon: [name] Champagne, MD;  Location: [facility];  Service: General;  Laterality: N/A;   CARDIAC CATHETERIZATION      CESAREAN SECTION  2012   COLONOSCOPY     CORONARY STENT INTERVENTION N/A 09/05/2020   Procedure: CORONARY STENT INTERVENTION;  Surgeon: Cody Das, MD;  [facility];  Service: Cardiovascular;  Laterality: N/A;   CORONARY ULTRASOUND/IVUS N/A 09/05/2020   Procedure: Intravascular Ultrasound/IVUS;  Surgeon: Cody Das, MD;  [facility];  Service: Cardiovascular;  Laterality: N/A;   DIAGNOSTIC LAPAROSCOPY     ESOPHAGOGASTRODUODENOSCOPY N/A 06/03/2012   Procedure: ESOPHAGOGASTRODUODENOSCOPY (EGD);  Surgeon: Tami Falcon, MD;  Location[facility] ENDOSCOPY;  Service: Endoscopy;  Laterality: N/A;   EXCISION MASS ABDOMINAL N/A 05/25/2015  Procedure: ABDOMINAL WALL EXPLORATION EXCISION OF SEROMA REMOVAL OF REDUNDANT SKIN ;  Surgeon: [name] Champagne, MD;  Location: [facility];  Service: General;  Laterality: N/A;   EYE SURGERY     Eye laser for vessel hemorrhaging   fybroid removal     HERNIA REPAIR  10/03/2011   ventral hernia repair   INJECTION, INTRAVITREAL, THERAPEUTIC AGENT, EYE Left 01/09/2023   Procedure: Injection, Intravitreal, therapeutic agent, eye;  Surgeon: Jearline Minder, MD;  Location: [facility] OR;  Service: Ophthalmology;  Laterality: Left;   INSERTION OF MESH N/A 02/04/2013   Procedure: INSERTION OF MESH;  Surgeon: Eddye Goodie, MD;  Location: [facility];  Service: General;  Laterality: N/A;   LEFT HEART CATH AND CORONARY ANGIOGRAPHY N/A 09/05/2020   Procedure: LEFT HEART CATH AND CORONARY ANGIOGRAPHY;  Surgeon: Cody Das, MD;  [facility];  Service: Cardiovascular;  Laterality: N/A;   PARS PLANA VITRECTOMY Left 08/24/2022   Procedure: PARS PLANA VITRECTOMY 25 GAUGE FOR ENDOPHTHALMITIS; INJECTION OF ANTIBIOTIC;  Surgeon: Jearline Minder, MD;  Location: [facility] OR;  Service: Ophthalmology;  Laterality: Left;   UMBILICAL HERNIA REPAIR N/A 02/04/2013   Procedure: LAPAROSCOPIC ventral wall hernia repair LAPAROSCOPIC LYSIS OF  ADHESIONS laparoscopic exploration of abdomen ;  Surgeon: Eddye Goodie, MD;  Location: [facility];  Service: General;  Laterality: N/A;   UPPER GASTROINTESTINAL ENDOSCOPY     URETER REVISION     Bilateral "twisted"   UTERINE FIBROID SURGERY     2 SURGERIES FOR FIBROIDS   VENTRAL HERNIA REPAIR  10/03/2011   Procedure: LAPAROSCOPIC VENTRAL HERNIA;  Surgeon: Eddye Goodie, MD;  Location: [facility];  Service: General;  Laterality: N/A;   Past Medical History:  Diagnosis Date   Allergy    Anemia    DURING MENSES--HAS HEAVY BLEEDING WITH PERODS   Angioedema 08/13/2020   Anxiety    Back pain, chronic    "ongoing"   Blood transfusion    IN 2012  AFTER C -SECTION   Cerebral thrombosis with cerebral infarction (HCC) 06/2009   RIGHT SIDED WEAKNESS ( ARM AND LEG ) AND SPASMS-remains with slight weakness and vertigo.   Constipation    Depression    Diabetes mellitus    Diabetic neuropathy (HCC)    BOTH FEET --COMES AND GOES   Edema, lower extremity    Endometriosis    Fatty liver    GERD (gastroesophageal reflux disease)    with pregnancy   H/O eye surgery    Headache(784.0)    MIGRAINES--NOT REALLY HEADACHE-MORE LIKE PRESSURE SENSATION IN HEAD-FEELS DIZZIY AND  FAINT AS THE PRESSURE RESOLVES   Hernia, incisional, RLQ, s/p lap repair Sep 2013 09/02/2011   History of vertigo 03/22/2018   Hx of migraines 10/19/2011   Hyperlipidemia    Hypertension    Leg pain, right    "like bad Charley horse"   Multiple food allergies    Panic disorder without agoraphobia    Rash    HANDS, ARMS --STATES HX OF RASH EVER SINCE CHILDBIRTH/PREGNANCY.  STATES THE RASH OFTEN OCCURS WHEN SHE IS REALLY STRESSED."goes and comes-presently left ring finger"   Restless leg syndrome    DIAGNOSED BY SLEEP STUDY - PT TOLD SHE DID NOT HAVE SLEEP APNEA   Right rotator cuff tear    PAIN IN RIGHT SHOULDER   SBO (small bowel obstruction) (HCC) 06/03/2012   Shortness of breath    Spastic hemiplegia affecting dominant  side (HCC)    Stomach problems  Stroke (HCC)    Ventral hernia    RIGHT LOWER QUADRANT-CAUSING SOME PAIN   Weakness of right side of body    BP (!) 145/88   Pulse 76   Ht 5\' 6"  (1.676 m)   Wt 248 lb (112.5 kg)   SpO2 97%   BMI 40.03 kg/m   Opioid Risk Score:   Fall Risk Score:  `1  Depression screen PHQ 2/9     11/07/2022   11:00 AM 09/11/2022    2:54 PM 08/12/2022    2:54 PM 09/03/2021    1:23 PM 07/11/2021   11:20 AM 05/30/2021   10:56 AM 03/26/2021   10:21 AM  Depression screen PHQ 2/9  Decreased Interest 0 0 0 1 0 1 1  Down, Depressed, Hopeless 0 0 0 1 0 1 1  PHQ - 2 Score 0 0 0 2 0 2 2      Review of Systems  Musculoskeletal:  Positive for back pain.       Back of right leg pain  All other systems reviewed and are negative.     Objective:   Physical Exam General no acute distress Mood affect appropriate Extremities without edema Negative straight leg raising bilaterally  Sacral thrust (prone) : Positive left Lateral compression: Positive left FABER's: Positive left Distraction (supine): Positive left Thigh thrust test: Positive left   Motor strength is 4/5 in the right hip flexor knee extensor ankle dorsiflexion 5/5 in left hip flexor knee extensor ankle dorsiflexion Ambulates without assistive device wide-based gait small step length Lumbar spine has no evidence of scoliosis or kyphosis Mild tenderness palpation lumbar paraspinals     Assessment & Plan:  1.  Right sacroiliac disorder she has had good short-term relief with sacroiliac injections.  She has been seen by orthospine who plans to do fusion once hemoglobin A1c is 8 or below Will continue tramadol  50 mg twice daily Continue encourage activity such as walking Return to clinic in 6 months Repeat UDS next visit

## 2023-06-22 DIAGNOSIS — H59813 Chorioretinal scars after surgery for detachment, bilateral: Secondary | ICD-10-CM | POA: Diagnosis not present

## 2023-06-22 DIAGNOSIS — H3582 Retinal ischemia: Secondary | ICD-10-CM | POA: Diagnosis not present

## 2023-06-22 DIAGNOSIS — E113513 Type 2 diabetes mellitus with proliferative diabetic retinopathy with macular edema, bilateral: Secondary | ICD-10-CM | POA: Diagnosis not present

## 2023-07-04 DIAGNOSIS — E1165 Type 2 diabetes mellitus with hyperglycemia: Secondary | ICD-10-CM | POA: Diagnosis not present

## 2023-07-04 DIAGNOSIS — E1169 Type 2 diabetes mellitus with other specified complication: Secondary | ICD-10-CM | POA: Diagnosis not present

## 2023-07-04 DIAGNOSIS — E1122 Type 2 diabetes mellitus with diabetic chronic kidney disease: Secondary | ICD-10-CM | POA: Diagnosis not present

## 2023-07-06 DIAGNOSIS — E113512 Type 2 diabetes mellitus with proliferative diabetic retinopathy with macular edema, left eye: Secondary | ICD-10-CM | POA: Diagnosis not present

## 2023-07-16 ENCOUNTER — Other Ambulatory Visit (HOSPITAL_COMMUNITY): Payer: Self-pay

## 2023-07-16 ENCOUNTER — Other Ambulatory Visit: Payer: Self-pay

## 2023-07-16 ENCOUNTER — Other Ambulatory Visit: Payer: Self-pay | Admitting: Cardiology

## 2023-07-16 DIAGNOSIS — E1140 Type 2 diabetes mellitus with diabetic neuropathy, unspecified: Secondary | ICD-10-CM

## 2023-07-16 MED ORDER — MOUNJARO 10 MG/0.5ML ~~LOC~~ SOAJ
10.0000 mg | SUBCUTANEOUS | 0 refills | Status: DC
  Filled 2023-07-16 – 2023-07-23 (×4): qty 2, 28d supply, fill #0

## 2023-07-20 ENCOUNTER — Other Ambulatory Visit: Payer: Self-pay

## 2023-07-20 ENCOUNTER — Encounter: Payer: Self-pay | Admitting: Pharmacist

## 2023-07-22 DIAGNOSIS — E113511 Type 2 diabetes mellitus with proliferative diabetic retinopathy with macular edema, right eye: Secondary | ICD-10-CM | POA: Diagnosis not present

## 2023-07-23 ENCOUNTER — Other Ambulatory Visit (HOSPITAL_COMMUNITY): Payer: Self-pay

## 2023-07-23 ENCOUNTER — Other Ambulatory Visit: Payer: Self-pay

## 2023-07-23 NOTE — Telephone Encounter (Signed)
 Patient called saying that she has not gotten her increased dose of Mounjaro .  Advised patient that she needs to call [employer] for new payment method.  Phone number given.  I asked about her blood sugars she states that she had too low blood sugars in the middle of the night the other night where she had a hard time getting up.  For dinner that night she had given herself 20 units of NovoLog  because she was eating a higher starch meal.  I recommended patient track her blood sugars without any sliding scale to see if it is needed.  She has not been using her long-acting insulin .

## 2023-07-24 ENCOUNTER — Other Ambulatory Visit: Payer: Self-pay

## 2023-07-24 DIAGNOSIS — M533 Sacrococcygeal disorders, not elsewhere classified: Secondary | ICD-10-CM | POA: Diagnosis not present

## 2023-07-31 ENCOUNTER — Other Ambulatory Visit (HOSPITAL_COMMUNITY): Payer: Self-pay

## 2023-08-03 ENCOUNTER — Ambulatory Visit (HOSPITAL_COMMUNITY)
Admission: RE | Admit: 2023-08-03 | Discharge: 2023-08-03 | Disposition: A | Discharge status: Home / Self Care | Payer: PPO | Source: Ambulatory Visit | Attending: Cardiology | Admitting: Cardiology

## 2023-08-03 DIAGNOSIS — E1165 Type 2 diabetes mellitus with hyperglycemia: Secondary | ICD-10-CM | POA: Diagnosis not present

## 2023-08-03 DIAGNOSIS — E1122 Type 2 diabetes mellitus with diabetic chronic kidney disease: Secondary | ICD-10-CM | POA: Diagnosis not present

## 2023-08-03 DIAGNOSIS — I2510 Atherosclerotic heart disease of native coronary artery without angina pectoris: Secondary | ICD-10-CM | POA: Diagnosis not present

## 2023-08-03 DIAGNOSIS — E1169 Type 2 diabetes mellitus with other specified complication: Secondary | ICD-10-CM | POA: Diagnosis not present

## 2023-08-03 LAB — ECHOCARDIOGRAM COMPLETE: S' Lateral: 3.35 cm

## 2023-08-09 ENCOUNTER — Ambulatory Visit: Payer: Self-pay | Admitting: Cardiology

## 2023-08-10 DIAGNOSIS — N1831 Chronic kidney disease, stage 3a: Secondary | ICD-10-CM | POA: Diagnosis not present

## 2023-08-10 DIAGNOSIS — E785 Hyperlipidemia, unspecified: Secondary | ICD-10-CM | POA: Diagnosis not present

## 2023-08-10 DIAGNOSIS — I129 Hypertensive chronic kidney disease with stage 1 through stage 4 chronic kidney disease, or unspecified chronic kidney disease: Secondary | ICD-10-CM | POA: Diagnosis not present

## 2023-08-10 DIAGNOSIS — E1122 Type 2 diabetes mellitus with diabetic chronic kidney disease: Secondary | ICD-10-CM | POA: Diagnosis not present

## 2023-08-10 DIAGNOSIS — E113512 Type 2 diabetes mellitus with proliferative diabetic retinopathy with macular edema, left eye: Secondary | ICD-10-CM | POA: Diagnosis not present

## 2023-08-13 ENCOUNTER — Ambulatory Visit: Payer: Self-pay | Admitting: Cardiology

## 2023-08-13 DIAGNOSIS — E1165 Type 2 diabetes mellitus with hyperglycemia: Secondary | ICD-10-CM | POA: Diagnosis not present

## 2023-08-17 ENCOUNTER — Other Ambulatory Visit: Payer: Self-pay

## 2023-08-17 ENCOUNTER — Other Ambulatory Visit (HOSPITAL_COMMUNITY): Payer: Self-pay

## 2023-08-17 ENCOUNTER — Telehealth: Payer: Self-pay | Admitting: Pharmacist

## 2023-08-17 MED ORDER — MOUNJARO 10 MG/0.5ML ~~LOC~~ SOAJ
10.0000 mg | SUBCUTANEOUS | 0 refills | Status: DC
  Filled 2023-08-17: qty 2, 28d supply, fill #0

## 2023-08-17 NOTE — Telephone Encounter (Signed)
 Called patient to follow-up on how Mounjaro  was going.  She states that she had some diarrhea started a few days ago but it is getting better.  She has only needed to use a small amount of sliding scale insulin  once or twice.  She is down to 232 pounds.  Would like to stay on Mounjaro  10 mg weekly for a little longer.  Will follow-up next month with patient

## 2023-08-18 ENCOUNTER — Other Ambulatory Visit: Payer: Self-pay

## 2023-08-20 DIAGNOSIS — N1832 Chronic kidney disease, stage 3b: Secondary | ICD-10-CM | POA: Diagnosis not present

## 2023-08-20 DIAGNOSIS — E1165 Type 2 diabetes mellitus with hyperglycemia: Secondary | ICD-10-CM | POA: Diagnosis not present

## 2023-08-20 DIAGNOSIS — I10 Essential (primary) hypertension: Secondary | ICD-10-CM | POA: Diagnosis not present

## 2023-08-20 DIAGNOSIS — I6930 Unspecified sequelae of cerebral infarction: Secondary | ICD-10-CM | POA: Diagnosis not present

## 2023-08-26 ENCOUNTER — Other Ambulatory Visit: Payer: Self-pay | Admitting: Orthopedic Surgery

## 2023-08-26 ENCOUNTER — Telehealth: Payer: Self-pay

## 2023-08-26 DIAGNOSIS — E113511 Type 2 diabetes mellitus with proliferative diabetic retinopathy with macular edema, right eye: Secondary | ICD-10-CM | POA: Diagnosis not present

## 2023-08-26 NOTE — Telephone Encounter (Signed)
   Name: Tina [name]  DOB: [birth year]  MRN: [id number]  Primary Cardiologist: Michele    Preoperative team, please contact this patient and set up a phone call appointment for further preoperative risk assessment. Please obtain consent and complete medication review. Thank you for your help.  I confirm that guidance regarding antiplatelet and oral anticoagulation therapy has been completed and, if necessary, noted below.  Per office protocol, if patient is without any new symptoms or concerns at the time of their virtual visit, she may hold Plavix  for 5 days prior to procedure. Please resume Plavix   as soon as possible postprocedure, at the discretion of the surgeon.    I also confirmed the patient resides in the state of [place] . As per [facility] Board telemedicine laws, the patient must reside in the state in which the provider is licensed.   Lamarr Satterfield, NP 08/26/2023, 2:26 PM [facility]

## 2023-08-26 NOTE — Telephone Encounter (Signed)
 Patient has been scheduled TELE for 08/27/23 @ 10:40am, per MARLA Mose. NP

## 2023-08-26 NOTE — Telephone Encounter (Signed)
   Pre-operative Risk Assessment    Patient Name: Tina [name]  DOB: 1970-09-28 MRN: [id number]   Date of last office visit: 02/19/23 [name], NP Date of next office visit: 08/31/23 [name], DO   Request for Surgical Clearance    Procedure:  RIGHT-SIDED SACROILIAC JOINT FUSION  Date of Surgery:  Clearance 09/01/23                                Surgeon:  NOT INDICATED Surgeon's Group or Practice Name:  [facility] Phone number:  [phone] Fax number:  [phone]  ATTN: [name]   Type of Clearance Requested:   - Medical  - Pharmacy:  Hold Clopidogrel  (Plavix )     Type of Anesthesia:  General    Additional requests/questions:    SignedLucie DELENA Ku   08/26/2023, 2:17 PM

## 2023-08-27 ENCOUNTER — Ambulatory Visit: Attending: Internal Medicine | Admitting: Nurse Practitioner

## 2023-08-27 DIAGNOSIS — Z01810 Encounter for preprocedural cardiovascular examination: Secondary | ICD-10-CM | POA: Diagnosis not present

## 2023-08-27 DIAGNOSIS — Z01818 Encounter for other preprocedural examination: Secondary | ICD-10-CM

## 2023-08-27 DIAGNOSIS — M65342 Trigger finger, left ring finger: Secondary | ICD-10-CM | POA: Diagnosis not present

## 2023-08-27 NOTE — Progress Notes (Signed)
 Virtual Visit via Telephone Note   Because of Tina [name] co-morbid illnesses, she is at least at moderate risk for complications without adequate follow up.  This format is felt to be most appropriate for this patient at this time.  Due to technical limitations with video connection (technology), today's appointment will be conducted as an audio only telehealth visit, and Tina [name] verbally agreed to proceed in this manner.   All issues noted in this document were discussed and addressed.  No physical exam could be performed with this format.  Evaluation Performed:  Preoperative cardiovascular risk assessment _____________   Date:  08/27/2023   Patient ID:  Tina [name], DOB 11-14-1970, MRN [id number] Patient Location:  Home Provider location:   Office  Primary Care Provider:  Gerome Brunet, DO Primary Cardiologist:  None  Chief Complaint / Patient Profile   53 y.o. y/o female with a h/o CAD s/p PCI-OM2 in 2022, hypertension, hyperlipidemia, type 2 diabetes, CVA, OSA, and obesity who is pending right sided sacroiliac joint fusion on 09/01/2023 with [facility] and presents today for telephonic preoperative cardiovascular risk assessment.  History of Present Illness    Tina [name] is a 53 y.o. female who presents via audio/video conferencing for a telehealth visit today.  Pt was last seen in cardiology clinic on 02/19/2023 by [name], NP.  At that time Tina [name] was doing well.  The patient is now pending procedure as outlined above. Since her last visit, she has done well from a cardiac standpoint.   She denies chest pain, palpitations, dyspnea, pnd, orthopnea, n, v, dizziness, syncope, edema, weight gain, or early satiety. All other systems reviewed and are otherwise negative except as noted above.   Past Medical History    Past Medical History:  Diagnosis Date   Allergy    Anemia    DURING MENSES--HAS HEAVY  BLEEDING WITH PERODS   Angioedema 08/13/2020   Anxiety    Back pain, chronic    ongoing   Blood transfusion    IN 2012  AFTER C -SECTION   Cerebral thrombosis with cerebral infarction (HCC) 06/2009   RIGHT SIDED WEAKNESS ( ARM AND LEG ) AND SPASMS-remains with slight weakness and vertigo.   Constipation    Depression    Diabetes mellitus    Diabetic neuropathy (HCC)    BOTH FEET --COMES AND GOES   Edema, lower extremity    Endometriosis    Fatty liver    GERD (gastroesophageal reflux disease)    with pregnancy   H/O eye surgery    Headache(784.0)    MIGRAINES--NOT REALLY HEADACHE-MORE LIKE PRESSURE SENSATION IN HEAD-FEELS DIZZIY AND  FAINT AS THE PRESSURE RESOLVES   Hernia, incisional, RLQ, s/p lap repair Sep 2013 09/02/2011   History of vertigo 03/22/2018   Hx of migraines 10/19/2011   Hyperlipidemia    Hypertension    Leg pain, right    like bad Charley horse   Multiple food allergies    Panic disorder without agoraphobia    Rash    HANDS, ARMS --STATES HX OF RASH EVER SINCE CHILDBIRTH/PREGNANCY.  STATES THE RASH OFTEN OCCURS WHEN SHE IS REALLY STRESSED.goes and comes-presently left ring finger   Restless leg syndrome    DIAGNOSED BY SLEEP STUDY - PT TOLD SHE DID NOT HAVE SLEEP APNEA   Right rotator cuff tear    PAIN IN RIGHT SHOULDER   SBO (small bowel obstruction) (HCC) 06/03/2012   Shortness  of breath    Spastic hemiplegia affecting dominant side (HCC)    Stomach problems    Stroke (HCC)    Ventral hernia    RIGHT LOWER QUADRANT-CAUSING SOME PAIN   Weakness of right side of body    Past Surgical History:  Procedure Laterality Date   APPLICATION OF WOUND VAC N/A 05/25/2015   Procedure: APPLICATION OF WOUND VAC;  Surgeon: Elspeth Schultze, MD;  Location: [facility];  Service: General;  Laterality: N/A;   CARDIAC CATHETERIZATION     CESAREAN SECTION  2012   COLONOSCOPY     CORONARY STENT INTERVENTION N/A 09/05/2020   Procedure: CORONARY STENT INTERVENTION;   Surgeon: Elmira Newman PARAS, MD;  Location: [facility];  Service: Cardiovascular;  Laterality: N/A;   CORONARY ULTRASOUND/IVUS N/A 09/05/2020   Procedure: Intravascular Ultrasound/IVUS;  Surgeon: Elmira Newman PARAS, MD;  Location: [facility];  Service: Cardiovascular;  Laterality: N/A;   DIAGNOSTIC LAPAROSCOPY     ESOPHAGOGASTRODUODENOSCOPY N/A 06/03/2012   Procedure: ESOPHAGOGASTRODUODENOSCOPY (EGD);  Surgeon: Renaye Sous, MD;  Location: [facility] ENDOSCOPY;  Service: Endoscopy;  Laterality: N/A;   EXCISION MASS ABDOMINAL N/A 05/25/2015   Procedure: ABDOMINAL WALL EXPLORATION EXCISION OF SEROMA REMOVAL OF REDUNDANT SKIN ;  Surgeon: Elspeth Schultze, MD;  Location: [facility];  Service: General;  Laterality: N/A;   EYE SURGERY     Eye laser for vessel hemorrhaging   fybroid removal     HERNIA REPAIR  10/03/2011   ventral hernia repair   INJECTION, INTRAVITREAL, THERAPEUTIC AGENT, EYE Left 01/09/2023   Procedure: Injection, Intravitreal, therapeutic agent, eye;  Surgeon: Tobie Baptist, MD;  Location[facility];  Service: Ophthalmology;  Laterality: Left;   INSERTION OF MESH N/A 02/04/2013   Procedure: INSERTION OF MESH;  Surgeon: Elspeth KYM Schultze, MD;  Location: [facility];  Service: General;  Laterality: N/A;   LEFT HEART CATH AND CORONARY ANGIOGRAPHY N/A 09/05/2020   Procedure: LEFT HEART CATH AND CORONARY ANGIOGRAPHY;  Surgeon: Elmira Newman PARAS, MD;  Location: [facility];  Service: Cardiovascular;  Laterality: N/A;   PARS PLANA VITRECTOMY Left 08/24/2022   Procedure: PARS PLANA VITRECTOMY 25 GAUGE FOR ENDOPHTHALMITIS; INJECTION OF ANTIBIOTIC;  Surgeon: Tobie Baptist, MD;  Location: [facility] OR;  Service: Ophthalmology;  Laterality: Left;   UMBILICAL HERNIA REPAIR N/A 02/04/2013   Procedure: LAPAROSCOPIC ventral wall hernia repair LAPAROSCOPIC LYSIS OF ADHESIONS laparoscopic exploration of abdomen ;  Surgeon: Elspeth KYM Schultze, MD;  Location: [facility];  Service: General;  Laterality: N/A;    UPPER GASTROINTESTINAL ENDOSCOPY     URETER REVISION     Bilateral twisted   UTERINE FIBROID SURGERY     2 SURGERIES FOR FIBROIDS   VENTRAL HERNIA REPAIR  10/03/2011   Procedure: LAPAROSCOPIC VENTRAL HERNIA;  Surgeon: Elspeth KYM Schultze, MD;  Location: [facility];  Service: General;  Laterality: N/A;    Allergies  Allergies  Allergen Reactions   Amlodipine  Benzoate Swelling and Other (See Comments)    Leg swelling Other reaction(s): leg swelling Other reaction(s): ? leg swelling at 10 mg dose Other reaction(s): ? leg swelling at 10 mg dose Other reaction(s): ? leg swelling at 10 mg dose   Contrast Media [Iodinated Contrast Media] Shortness Of Breath and Other (See Comments)    Difficulty breathing   Iodine Anaphylaxis    Other reaction(s): Unknown Other reaction(s): Unknown Other reaction(s): Unknown Other reaction(s): Unknown   Iohexol  Hives, Nausea And Vomiting, Swelling and Other (See Comments)     Desc: Magnevist-gadolinium-difficulty  breathing, throat swelling    Midazolam  Hcl Anaphylaxis    Difficulty breathing   Other Shortness Of Breath, Itching and Other (See Comments)    Patient is allergic to all nuts except peanuts, which are NOT nuts- they are legumes   Shellfish Allergy Anaphylaxis   Shellfish-Derived Products Anaphylaxis and Other (See Comments)    Other reaction(s): Unknown   Valsartan  Swelling    Other reaction(s): Unknown Other reaction(s): Unknown Other reaction(s): angioedema Other reaction(s): angioedema   Metformin And Related Diarrhea, Nausea And Vomiting and Other (See Comments)    Dehydration, also   [place] [Dapagliflozin] Nausea Only and Other (See Comments)    UTI, headaches and muscle aches, also   Hydralazine  Other (See Comments)    Reaction not recalled Other reaction(s): Unknown Other reaction(s): Unknown Other reaction(s): Unknown Other reaction(s): Unknown   Saxagliptin Hives, Nausea And Vomiting and Other (See Comments)    ONGLYZA-  dehydration, also Other reaction(s): Unknown Other reaction(s): Unknown Other reaction(s): Unknown Other reaction(s): Unknown   Spironolactone  Other (See Comments)    Elevated potassium- Severe hyperkalemia Other reaction(s): severe hyperkalemia Other reaction(s): severe hyperkalemia Other reaction(s): severe hyperkalemia   Avandia [Rosiglitazone Maleate] Hives and Other (See Comments)   Geodon [Ziprasidone] Other (See Comments)    Reaction not recalled   Kiwi Extract Itching and Swelling   Latex Itching    Other reaction(s): Unknown Other reaction(s): Unknown    Home Medications    Prior to Admission medications   Medication Sig Start Date End Date Taking? Authorizing Provider  albuterol  (VENTOLIN  HFA) 108 (90 Base) MCG/ACT inhaler Inhale 2 puffs into the lungs every 4 (four) hours as needed for wheezing or shortness of breath. 08/16/20   Cari Arlean HERO, FNP  allopurinol  (ZYLOPRIM ) 100 MG tablet Take 100 mg by mouth daily.    [provider]  amLODipine  (NORVASC ) 5 MG tablet Take 1 tablet (5 mg total) by mouth daily. 09/06/20 08/12/22  Tolia, Sunit, DO  atorvastatin  (LIPITOR) 40 MG tablet Take 1 tablet (40 mg total) by mouth at bedtime. 09/06/20 08/12/22  Tolia, Sunit, DO  carvedilol  (COREG ) 25 MG tablet Take 1 tablet by mouth 2 (two) times daily with a meal.    [provider]  cetirizine  (ZYRTEC ) 10 MG tablet Take 10 mg by mouth daily as needed for allergies (itching). 08/16/20   [provider]  clopidogrel  (PLAVIX ) 75 MG tablet TAKE ONE TABLET BY MOUTH ONCE DAILY 04/10/22   Tolia, Sunit, DO  colchicine  0.6 MG tablet Take 0.6 mg by mouth daily as needed.    [provider]  Continuous Blood Gluc Sensor (FREESTYLE LIBRE 2 SENSOR) MISC Inject 1 Device into the skin every 14 (fourteen) days. 08/23/19   [provider]  DULoxetine  (CYMBALTA ) 20 MG capsule Take 20-40 mg by mouth See admin instructions. Take 20 mg by mouth in the morning and 40 mg at  bedtime    [provider]  empagliflozin (JARDIANCE) 25 MG TABS tablet 1 tablet 06/27/21   [provider]  EPINEPHrine  (AUVI-Q ) 0.3 mg/0.3 mL IJ SOAJ injection Inject 0.3 mg into the muscle as needed for anaphylaxis. 08/16/20   Cari Arlean HERO, FNP  famotidine  (PEPCID ) 20 MG tablet Take 1 tablet by mouth 2 (two) times daily.    [provider]  furosemide  (LASIX ) 40 MG tablet Take 40 mg by mouth in the morning.    [provider]  gabapentin  (NEURONTIN ) 300 MG capsule Take 300 mg by mouth at bedtime. 12/14/20  [provider]  hydrALAZINE  (APRESOLINE ) 10 MG tablet TAKE ONE TABLET BY MOUTH THREE TIMES DAILY 04/10/22   Tolia, Sunit, DO  Insulin  Aspart FlexPen (NOVOLOG ) 100 UNIT/ML Inject 0-25 Units into the skin 3 (three) times daily before meals. Dose per sliding scale. 09/20/20   [provider]  isosorbide  mononitrate (IMDUR ) 60 MG 24 hr tablet TAKE 1 TABLET BY MOUTH ONCE DAILY 11/14/22   Tolia, Sunit, DO  pantoprazole  (PROTONIX ) 40 MG tablet Take 1 tablet (40 mg total) by mouth daily. Please schedule an office visit for further refills 07/10/22   Armbruster, Elspeth SQUIBB, MD  spironolactone  (ALDACTONE ) 25 MG tablet Take 25 mg by mouth daily. 06/24/21   [provider]  tirzepatide  (MOUNJARO ) 10 MG/0.5ML Pen Inject 10 mg into the skin once a week. 08/17/23   Tolia, Sunit, DO  tiZANidine  (ZANAFLEX ) 2 MG tablet TAKE 1 TABLET BY MOUTH EVERY DAY AT BEDTIME 11/14/22   Kirsteins, Prentice BRAVO, MD  traMADol  (ULTRAM ) 50 MG tablet Take 1 tablet (50 mg total) by mouth 2 (two) times daily. 06/19/23   Kirsteins, Prentice BRAVO, MD  diphenhydrAMINE  (BENADRYL ) 25 MG tablet Take 1 tablet (25 mg total) by mouth every 8 (eight) hours as needed for up to 2 days for itching. 08/14/20 08/15/20  Fairy Frames, MD    Physical Exam    Vital Signs:  Tina [name] does not have vital signs available for review today.  Given telephonic nature of communication, physical exam is  limited. AAOx3. NAD. Normal affect.  Speech and respirations are unlabored.  Accessory Clinical Findings    None  Assessment & Plan    1.  Preoperative Cardiovascular Risk Assessment:  According to the Revised Cardiac Risk Index (RCRI), her Perioperative Risk of Major Cardiac Event is (%): 11. Her Functional Capacity in METs is: 5.29 according to the Duke Activity Status Index (DASI). Therefore, based on ACC/AHA guidelines, patient would be at acceptable risk for the planned procedure without further cardiovascular testing.  The patient was advised that if she develops new symptoms prior to surgery to contact our office to arrange for a follow-up visit, and she verbalized understanding.  Per office protocol, she may hold Plavix  for 5 days prior to procedure (pt was previously cleared to hold Plavix ). Please resume Plavix  as soon as possible postprocedure, at the discretion of the surgeon.   A copy of this note will be routed to requesting surgeon.  Time:   Today, I have spent 5 minutes with the patient with telehealth technology discussing medical history, symptoms, and management plan.     Damien JAYSON Braver, NP  08/27/2023, 10:50 AM

## 2023-08-27 NOTE — Telephone Encounter (Addendum)
 I am back today from vacation and found a vm left from 08/26/23 around 11ish from Arland Kirks, surgery scheduler in regard to pt and surgery clearance. Message stated the pt has mentioned her appts had been cancelled.   I called Arland and d/w about the clearance. I said I was not sure which appts were cancelled, however the pt had a tele preop appt today at 10:40 with Damien Braver. NP. I did review the notes from NP and informed Arland that Damien Braver, NP did clear the pt and provided recommendations for Plavix .   Arland did ask if I might be able to email the notes to her as well. I said that will be fine. Arland gave me her email address:   [email or web address]

## 2023-08-28 ENCOUNTER — Encounter: Payer: Self-pay | Admitting: Cardiology

## 2023-08-31 ENCOUNTER — Ambulatory Visit: Admitting: Cardiology

## 2023-08-31 DIAGNOSIS — M533 Sacrococcygeal disorders, not elsewhere classified: Secondary | ICD-10-CM | POA: Diagnosis not present

## 2023-09-01 DIAGNOSIS — M461 Sacroiliitis, not elsewhere classified: Secondary | ICD-10-CM | POA: Diagnosis not present

## 2023-09-01 DIAGNOSIS — M533 Sacrococcygeal disorders, not elsewhere classified: Secondary | ICD-10-CM | POA: Diagnosis not present

## 2023-09-01 HISTORY — PX: OTHER SURGICAL HISTORY: SHX169

## 2023-09-03 ENCOUNTER — Other Ambulatory Visit: Payer: Self-pay

## 2023-09-03 ENCOUNTER — Emergency Department (HOSPITAL_COMMUNITY)
Admission: EM | Admit: 2023-09-03 | Discharge: 2023-09-04 | Disposition: A | Discharge status: Home / Self Care | Attending: Emergency Medicine | Admitting: Emergency Medicine

## 2023-09-03 ENCOUNTER — Encounter (HOSPITAL_COMMUNITY): Payer: Self-pay | Admitting: Emergency Medicine

## 2023-09-03 DIAGNOSIS — Z794 Long term (current) use of insulin: Secondary | ICD-10-CM | POA: Diagnosis not present

## 2023-09-03 DIAGNOSIS — M7989 Other specified soft tissue disorders: Secondary | ICD-10-CM | POA: Diagnosis not present

## 2023-09-03 DIAGNOSIS — M1611 Unilateral primary osteoarthritis, right hip: Secondary | ICD-10-CM | POA: Diagnosis not present

## 2023-09-03 DIAGNOSIS — Z91040 Latex allergy status: Secondary | ICD-10-CM | POA: Insufficient documentation

## 2023-09-03 DIAGNOSIS — M25551 Pain in right hip: Secondary | ICD-10-CM | POA: Diagnosis not present

## 2023-09-03 DIAGNOSIS — M545 Low back pain, unspecified: Secondary | ICD-10-CM | POA: Insufficient documentation

## 2023-09-03 DIAGNOSIS — G8918 Other acute postprocedural pain: Secondary | ICD-10-CM | POA: Diagnosis not present

## 2023-09-03 DIAGNOSIS — E1169 Type 2 diabetes mellitus with other specified complication: Secondary | ICD-10-CM | POA: Diagnosis not present

## 2023-09-03 DIAGNOSIS — E1165 Type 2 diabetes mellitus with hyperglycemia: Secondary | ICD-10-CM | POA: Diagnosis not present

## 2023-09-03 DIAGNOSIS — R600 Localized edema: Secondary | ICD-10-CM | POA: Insufficient documentation

## 2023-09-03 DIAGNOSIS — E1122 Type 2 diabetes mellitus with diabetic chronic kidney disease: Secondary | ICD-10-CM | POA: Diagnosis not present

## 2023-09-03 LAB — CBC WITH DIFFERENTIAL/PLATELET
Abs Immature Granulocytes: 0.02 K/uL (ref 0.00–0.07)
Basophils Absolute: 0.1 K/uL (ref 0.0–0.1)
Basophils Relative: 1 %
Eosinophils Absolute: 0.6 K/uL — ABNORMAL HIGH (ref 0.0–0.5)
Eosinophils Relative: 8 %
HCT: 33.1 % — ABNORMAL LOW (ref 36.0–46.0)
Hemoglobin: 10.4 g/dL — ABNORMAL LOW (ref 12.0–15.0)
Immature Granulocytes: 0 %
Lymphocytes Relative: 21 %
Lymphs Abs: 1.6 K/uL (ref 0.7–4.0)
MCH: 27.1 pg (ref 26.0–34.0)
MCHC: 31.4 g/dL (ref 30.0–36.0)
MCV: 86.2 fL (ref 80.0–100.0)
Monocytes Absolute: 0.6 K/uL (ref 0.1–1.0)
Monocytes Relative: 8 %
Neutro Abs: 4.8 K/uL (ref 1.7–7.7)
Neutrophils Relative %: 62 %
Platelets: 266 K/uL (ref 150–400)
RBC: 3.84 MIL/uL — ABNORMAL LOW (ref 3.87–5.11)
RDW: 15 % (ref 11.5–15.5)
WBC: 7.6 K/uL (ref 4.0–10.5)
nRBC: 0 % (ref 0.0–0.2)

## 2023-09-03 LAB — BASIC METABOLIC PANEL WITH GFR
Anion gap: 8 (ref 5–15)
BUN: 19 mg/dL (ref 6–20)
CO2: 25 mmol/L (ref 22–32)
Calcium: 8.9 mg/dL (ref 8.9–10.3)
Chloride: 105 mmol/L (ref 98–111)
Creatinine, Ser: 1.71 mg/dL — ABNORMAL HIGH (ref 0.44–1.00)
GFR, Estimated: 35 mL/min — ABNORMAL LOW (ref >60)
Glucose, Bld: 94 mg/dL (ref 70–99)
Potassium: 4.3 mmol/L (ref 3.5–5.1)
Sodium: 138 mmol/L (ref 135–145)

## 2023-09-03 LAB — BRAIN NATRIURETIC PEPTIDE: B Natriuretic Peptide: 18.4 pg/mL (ref 0.0–100.0)

## 2023-09-03 NOTE — ED Provider Notes (Signed)
 [place] EMERGENCY DEPARTMENT AT [facility] Provider Note   CSN: [id number] Arrival date & time: 09/03/23  8167     Patient presents with: Post-op Problem and Foot Swelling   Tina [name] is a 53 y.o. female presents today after a spinal fusion on Thursday.  Patient reports severe pain to right lower back that radiates down knee and bilateral feet swelling.  Patient increased her p.o. pain medication to every 4 hours per Dr. Recommendation with no change in relief.  Patient last took pain medication at 1520. SI fusion,    HPI     Prior to Admission medications   Medication Sig Start Date End Date Taking? Authorizing Provider  albuterol  (VENTOLIN  HFA) 108 (90 Base) MCG/ACT inhaler Inhale 2 puffs into the lungs every 4 (four) hours as needed for wheezing or shortness of breath. 08/16/20   Cari Arlean CHRISTELLA, FNP  allopurinol  (ZYLOPRIM ) 100 MG tablet Take 100 mg by mouth daily.    [provider]  amLODipine  (NORVASC ) 5 MG tablet Take 1 tablet (5 mg total) by mouth daily. 09/06/20 08/12/22  Tolia, Sunit, DO  atorvastatin  (LIPITOR) 40 MG tablet Take 1 tablet (40 mg total) by mouth at bedtime. 09/06/20 08/12/22  Tolia, Sunit, DO  carvedilol  (COREG ) 25 MG tablet Take 1 tablet by mouth 2 (two) times daily with a meal.    [provider]  cetirizine  (ZYRTEC ) 10 MG tablet Take 10 mg by mouth daily as needed for allergies (itching). 08/16/20   [provider]  clopidogrel  (PLAVIX ) 75 MG tablet TAKE ONE TABLET BY MOUTH ONCE DAILY 04/10/22   Tolia, Sunit, DO  colchicine  0.6 MG tablet Take 0.6 mg by mouth daily as needed.    [provider]  Continuous Blood Gluc Sensor (FREESTYLE LIBRE 2 SENSOR) MISC Inject 1 Device into the skin every 14 (fourteen) days. 08/23/19   [provider]  DULoxetine  (CYMBALTA ) 20 MG capsule Take 20-40 mg by mouth See admin instructions. Take 20 mg by mouth in the morning and 40 mg at bedtime    [provider]   empagliflozin (JARDIANCE) 25 MG TABS tablet 1 tablet 06/27/21   [provider]  EPINEPHrine  (AUVI-Q ) 0.3 mg/0.3 mL IJ SOAJ injection Inject 0.3 mg into the muscle as needed for anaphylaxis. 08/16/20   Cari Arlean CHRISTELLA, FNP  famotidine  (PEPCID ) 20 MG tablet Take 1 tablet by mouth 2 (two) times daily.    [provider]  furosemide  (LASIX ) 40 MG tablet Take 40 mg by mouth in the morning.    [provider]  gabapentin  (NEURONTIN ) 300 MG capsule Take 300 mg by mouth at bedtime. 12/14/20   [provider]  hydrALAZINE  (APRESOLINE ) 10 MG tablet TAKE ONE TABLET BY MOUTH THREE TIMES DAILY 04/10/22   Tolia, Sunit, DO  Insulin  Aspart FlexPen (NOVOLOG ) 100 UNIT/ML Inject 0-25 Units into the skin 3 (three) times daily before meals. Dose per sliding scale. 09/20/20   [provider]  isosorbide  mononitrate (IMDUR ) 60 MG 24 hr tablet TAKE 1 TABLET BY MOUTH ONCE DAILY 11/14/22   Tolia, Sunit, DO  pantoprazole  (PROTONIX ) 40 MG tablet Take 1 tablet (40 mg total) by mouth daily. Please schedule an office visit for further refills 07/10/22   Armbruster, Elspeth SQUIBB, MD  spironolactone  (ALDACTONE ) 25 MG tablet Take 25 mg by mouth daily. 06/24/21   [provider]  tirzepatide  (MOUNJARO ) 10 MG/0.5ML Pen Inject 10 mg into the skin once a week. 08/17/23   Tolia, Sunit,  DO  tiZANidine  (ZANAFLEX ) 2 MG tablet TAKE 1 TABLET BY MOUTH EVERY DAY AT BEDTIME 11/14/22   Kirsteins, Prentice BRAVO, MD  traMADol  (ULTRAM ) 50 MG tablet Take 1 tablet (50 mg total) by mouth 2 (two) times daily. 06/19/23   Kirsteins, Prentice BRAVO, MD  diphenhydrAMINE  (BENADRYL ) 25 MG tablet Take 1 tablet (25 mg total) by mouth every 8 (eight) hours as needed for up to 2 days for itching. 08/14/20 08/15/20  Fairy Frames, MD    Allergies: Amlodipine  benzoate, Contrast media [iodinated contrast media], Iodine, Iohexol , Midazolam  hcl, Other, Shellfish allergy, Shellfish-derived products, Valsartan , Metformin and related, Farxiga  [dapagliflozin], Hydralazine , Saxagliptin, Spironolactone , Avandia [rosiglitazone maleate], Geodon [ziprasidone], Kiwi extract, and Latex    Review of Systems  Updated Vital Signs BP (!) 127/53 (BP Location: Right Arm)   Pulse 75   Temp 98.3 F (36.8 C) (Oral)   Resp (!) 24   SpO2 100%   Physical Exam  (all labs ordered are listed, but only abnormal results are displayed) Labs Reviewed  CBC WITH DIFFERENTIAL/PLATELET - Abnormal; Notable for the following components:      Result Value   RBC 3.84 (*)    Hemoglobin 10.4 (*)    HCT 33.1 (*)    Eosinophils Absolute 0.6 (*)    All other components within normal limits  BASIC METABOLIC PANEL WITH GFR - Abnormal; Notable for the following components:   Creatinine, Ser 1.71 (*)    GFR, Estimated 35 (*)    All other components within normal limits  BRAIN NATRIURETIC PEPTIDE    EKG: None  Radiology: DG Hip Unilat W or Wo Pelvis 2-3 Views Right Result Date: 09/04/2023 CLINICAL DATA:  Hip pain EXAM: DG HIP (WITH OR WITHOUT PELVIS) 2-3V RIGHT COMPARISON:  CT 04/20/2018 FINDINGS: Evidence of hernia repair. Hardware across the right SI joint. Pubic symphysis and rami appear intact. No fracture or malalignment. Mild degenerative change IMPRESSION: Mild degenerative change. Electronically Signed   By: Luke Bun M.D.   On: 09/04/2023 00:54     Procedures   Medications Ordered in the ED  ketorolac  (TORADOL ) 15 MG/ML injection 15 mg (15 mg Intramuscular Given 09/04/23 0151)  methylPREDNISolone  sodium succinate  (SOLU-MEDROL ) 125 mg/2 mL injection 125 mg (125 mg Intramuscular Given 09/04/23 0152)  oxyCODONE -acetaminophen  (PERCOCET/ROXICET) 5-325 MG per tablet 1 tablet (1 tablet Oral Given 09/04/23 0237)                                    Medical Decision Making Amount and/or Complexity of Data Reviewed Labs: ordered.   This patient presents to the ED for concern of back pain differential diagnosis includes postop infection, postop  pain, fracture, dislocation   Lab Tests:  I Ordered, and personally interpreted labs.  The pertinent results include: Anemia at 10.4 which is chronic per historical values, elevated creatinine 1.71 which is around baseline, BNP 18.4   Imaging Studies ordered:  I ordered imaging studies including right hip x-ray I independently visualized and interpreted imaging which showed mild degenerative change. I agree with the radiologist interpretation   Medicines ordered and prescription drug management:  I ordered medication including Toradol  and Solu-Medrol , Percocet    I have reviewed the patients home medicines and have made adjustments as needed   Problem List / ED Course:  Consulted orthopedics, Dr. Beuford who stated that the patient's pain was likely due to his increased postop pain.  He recommended  pain control and stated that they would follow-up with her in the morning to arrange an in person appointment. Upon reassessment patient appears much more comfortable and states that her pain has improved to a more tolerable level. Considered for admission or further workup however patient's vital signs, physical exam, labs, and imaging are reassuring.  Patient symptoms likely due to postop pain.  Patient to follow-up with Dr. Beuford in the upcoming day or so for further evaluation workup.  Patient given return precautions.  I feel patient is safe for discharge at this time.       Final diagnoses:  Post-op pain    ED Discharge Orders     None          Francis Ileana LOISE DEVONNA 09/04/23 0242    Trine Raynell Moder, MD 09/04/23 [phone]

## 2023-09-03 NOTE — ED Triage Notes (Signed)
 Pt reports spinal fusion on Thursday. Endorses severe pain to right lower back that radiates down knee and bilateral feet swelling. Increased pain meds to every 4 hours per doc with no change in relief. Last took pain meds at 1520.

## 2023-09-03 NOTE — ED Provider Notes (Incomplete)
 [place] EMERGENCY DEPARTMENT AT [facility] Provider Note   CSN: [id number] Arrival date & time: 09/03/23  8167     Patient presents with: Post-op Problem and Foot Swelling   Tina [name] is a 53 y.o. female presents today after a spinal fusion on Thursday.  Patient reports severe pain to right lower back that radiates down knee and bilateral feet swelling.  Patient increased her p.o. pain medication to every 4 hours per Dr. Recommendation with no change in relief.  Patient last took pain medication at 1520.  {Add pertinent medical, surgical, social history, OB history to HPI:32947} HPI     Prior to Admission medications   Medication Sig Start Date End Date Taking? Authorizing Provider  albuterol  (VENTOLIN  HFA) 108 (90 Base) MCG/ACT inhaler Inhale 2 puffs into the lungs every 4 (four) hours as needed for wheezing or shortness of breath. 08/16/20   Cari Arlean CHRISTELLA, FNP  allopurinol  (ZYLOPRIM ) 100 MG tablet Take 100 mg by mouth daily.    [provider]  amLODipine  (NORVASC ) 5 MG tablet Take 1 tablet (5 mg total) by mouth daily. 09/06/20 08/12/22  Tolia, Sunit, DO  atorvastatin  (LIPITOR) 40 MG tablet Take 1 tablet (40 mg total) by mouth at bedtime. 09/06/20 08/12/22  Tolia, Sunit, DO  carvedilol  (COREG ) 25 MG tablet Take 1 tablet by mouth 2 (two) times daily with a meal.    [provider]  cetirizine  (ZYRTEC ) 10 MG tablet Take 10 mg by mouth daily as needed for allergies (itching). 08/16/20   [provider]  clopidogrel  (PLAVIX ) 75 MG tablet TAKE ONE TABLET BY MOUTH ONCE DAILY 04/10/22   Tolia, Sunit, DO  colchicine  0.6 MG tablet Take 0.6 mg by mouth daily as needed.    [provider]  Continuous Blood Gluc Sensor (FREESTYLE LIBRE 2 SENSOR) MISC Inject 1 Device into the skin every 14 (fourteen) days. 08/23/19   [provider]  DULoxetine  (CYMBALTA ) 20 MG capsule Take 20-40 mg by mouth See admin instructions. Take 20 mg by mouth in the  morning and 40 mg at bedtime    [provider]  empagliflozin (JARDIANCE) 25 MG TABS tablet 1 tablet 06/27/21   [provider]  EPINEPHrine  (AUVI-Q ) 0.3 mg/0.3 mL IJ SOAJ injection Inject 0.3 mg into the muscle as needed for anaphylaxis. 08/16/20   Cari Arlean CHRISTELLA, FNP  famotidine  (PEPCID ) 20 MG tablet Take 1 tablet by mouth 2 (two) times daily.    [provider]  furosemide  (LASIX ) 40 MG tablet Take 40 mg by mouth in the morning.    [provider]  gabapentin  (NEURONTIN ) 300 MG capsule Take 300 mg by mouth at bedtime. 12/14/20   [provider]  hydrALAZINE  (APRESOLINE ) 10 MG tablet TAKE ONE TABLET BY MOUTH THREE TIMES DAILY 04/10/22   Tolia, Sunit, DO  Insulin  Aspart FlexPen (NOVOLOG ) 100 UNIT/ML Inject 0-25 Units into the skin 3 (three) times daily before meals. Dose per sliding scale. 09/20/20   [provider]  isosorbide  mononitrate (IMDUR ) 60 MG 24 hr tablet TAKE 1 TABLET BY MOUTH ONCE DAILY 11/14/22   Tolia, Sunit, DO  pantoprazole  (PROTONIX ) 40 MG tablet Take 1 tablet (40 mg total) by mouth daily. Please schedule an office visit for further refills 07/10/22   Armbruster, Elspeth SQUIBB, MD  spironolactone  (ALDACTONE ) 25 MG tablet Take 25 mg by mouth daily. 06/24/21   [provider]  tirzepatide  (MOUNJARO ) 10 MG/0.5ML Pen Inject 10 mg into the skin once a  week. 08/17/23   Michele Richardson, DO  tiZANidine  (ZANAFLEX ) 2 MG tablet TAKE 1 TABLET BY MOUTH EVERY DAY AT BEDTIME 11/14/22   Kirsteins, Prentice BRAVO, MD  traMADol  (ULTRAM ) 50 MG tablet Take 1 tablet (50 mg total) by mouth 2 (two) times daily. 06/19/23   Kirsteins, Prentice BRAVO, MD  diphenhydrAMINE  (BENADRYL ) 25 MG tablet Take 1 tablet (25 mg total) by mouth every 8 (eight) hours as needed for up to 2 days for itching. 08/14/20 08/15/20  Fairy Frames, MD    Allergies: Amlodipine  benzoate, Contrast media [iodinated contrast media], Iodine, Iohexol , Midazolam  hcl, Other, Shellfish allergy, Shellfish-derived  products, Valsartan , Metformin and related, Farxiga [dapagliflozin], Hydralazine , Saxagliptin, Spironolactone , Avandia [rosiglitazone maleate], Geodon [ziprasidone], Kiwi extract, and Latex    Review of Systems  Updated Vital Signs BP 137/70 (BP Location: Left Arm)   Pulse 88   Temp 97.7 F (36.5 C)   Resp 19   SpO2 98%   Physical Exam  (all labs ordered are listed, but only abnormal results are displayed) Labs Reviewed  CBC WITH DIFFERENTIAL/PLATELET - Abnormal; Notable for the following components:      Result Value   RBC 3.84 (*)    Hemoglobin 10.4 (*)    HCT 33.1 (*)    Eosinophils Absolute 0.6 (*)    All other components within normal limits  BASIC METABOLIC PANEL WITH GFR - Abnormal; Notable for the following components:   Creatinine, Ser 1.71 (*)    GFR, Estimated 35 (*)    All other components within normal limits  BRAIN NATRIURETIC PEPTIDE    EKG: None  Radiology: No results found.  {Document cardiac monitor, telemetry assessment procedure when appropriate:32947} Procedures   Medications Ordered in the ED - No data to display    {Click here for ABCD2, HEART and other calculators REFRESH Note before signing:1}                              Medical Decision Making Amount and/or Complexity of Data Reviewed Labs: ordered.   ***  {Document critical care time when appropriate  Document review of labs and clinical decision tools ie CHADS2VASC2, etc  Document your independent review of radiology images and any outside records  Document your discussion with family members, caretakers and with consultants  Document social determinants of health affecting pt's care  Document your decision making why or why not admission, treatments were needed:32947:::1}   Final diagnoses:  None    ED Discharge Orders     None

## 2023-09-04 ENCOUNTER — Emergency Department (HOSPITAL_COMMUNITY)

## 2023-09-04 DIAGNOSIS — M1611 Unilateral primary osteoarthritis, right hip: Secondary | ICD-10-CM | POA: Diagnosis not present

## 2023-09-04 DIAGNOSIS — M25551 Pain in right hip: Secondary | ICD-10-CM | POA: Diagnosis not present

## 2023-09-04 MED ORDER — METHYLPREDNISOLONE SODIUM SUCC 125 MG IJ SOLR
125.0000 mg | Freq: Once | INTRAMUSCULAR | Status: AC
Start: 2023-09-04 — End: 2023-09-04
  Administered 2023-09-04: 125 mg via INTRAMUSCULAR
  Filled 2023-09-04: qty 2

## 2023-09-04 MED ORDER — KETOROLAC TROMETHAMINE 15 MG/ML IJ SOLN
15.0000 mg | Freq: Once | INTRAMUSCULAR | Status: AC
  Administered 2023-09-04: 15 mg via INTRAMUSCULAR
  Filled 2023-09-04: qty 1

## 2023-09-04 MED ORDER — OXYCODONE-ACETAMINOPHEN 5-325 MG PO TABS
1.0000 | ORAL_TABLET | Freq: Once | ORAL | Status: AC
  Administered 2023-09-04: 1 via ORAL
  Filled 2023-09-04: qty 1

## 2023-09-04 NOTE — Discharge Instructions (Signed)
 Today you were seen for postop pain.  Please call Dr. Andrey office first thing in the morning for further evaluation workup.  Please return to the ED if you have uncontrollable pain, fever that does not go down with Tylenol  or Motrin , paralysis, or puslike discharge from your surgical site.  Thank you for letting us  treat you today. After reviewing your labs and imaging, I feel you are safe to go home. Please follow up with your PCP in the next several days and provide them with your records from this visit. Return to the Emergency Room if pain becomes severe or symptoms worsen.

## 2023-09-21 ENCOUNTER — Other Ambulatory Visit: Payer: Self-pay | Admitting: Cardiology

## 2023-09-22 ENCOUNTER — Other Ambulatory Visit (HOSPITAL_COMMUNITY): Payer: Self-pay

## 2023-09-22 ENCOUNTER — Other Ambulatory Visit: Payer: Self-pay

## 2023-09-22 MED ORDER — MOUNJARO 10 MG/0.5ML ~~LOC~~ SOAJ
10.0000 mg | SUBCUTANEOUS | 11 refills | Status: AC
  Filled 2023-09-22: qty 2, 28d supply, fill #0
  Filled 2023-10-15: qty 2, 28d supply, fill #1
  Filled 2023-11-16: qty 2, 28d supply, fill #2
  Filled 2023-12-16: qty 2, 28d supply, fill #3
  Filled 2024-01-19 – 2024-02-02 (×3): qty 2, 28d supply, fill #4
  Filled 2024-02-19: qty 2, 28d supply, fill #5

## 2023-09-22 NOTE — Telephone Encounter (Signed)
 Patient called. States she is down to 224lb Using only 5-10 units when she eats, no long acting insulin  Feels good Hasn't been able to exercise due to back surgery, but is up and moving around now. Wants to stay at 10mg  of Mounjaro .

## 2023-09-23 DIAGNOSIS — E113512 Type 2 diabetes mellitus with proliferative diabetic retinopathy with macular edema, left eye: Secondary | ICD-10-CM | POA: Diagnosis not present

## 2023-09-30 DIAGNOSIS — E113512 Type 2 diabetes mellitus with proliferative diabetic retinopathy with macular edema, left eye: Secondary | ICD-10-CM | POA: Diagnosis not present

## 2023-09-30 DIAGNOSIS — H3582 Retinal ischemia: Secondary | ICD-10-CM | POA: Diagnosis not present

## 2023-09-30 DIAGNOSIS — E113511 Type 2 diabetes mellitus with proliferative diabetic retinopathy with macular edema, right eye: Secondary | ICD-10-CM | POA: Diagnosis not present

## 2023-10-04 DIAGNOSIS — E1122 Type 2 diabetes mellitus with diabetic chronic kidney disease: Secondary | ICD-10-CM | POA: Diagnosis not present

## 2023-10-04 DIAGNOSIS — E1169 Type 2 diabetes mellitus with other specified complication: Secondary | ICD-10-CM | POA: Diagnosis not present

## 2023-10-04 DIAGNOSIS — E1165 Type 2 diabetes mellitus with hyperglycemia: Secondary | ICD-10-CM | POA: Diagnosis not present

## 2023-10-12 ENCOUNTER — Other Ambulatory Visit: Payer: Self-pay | Admitting: Physical Medicine & Rehabilitation

## 2023-10-15 ENCOUNTER — Other Ambulatory Visit: Payer: Self-pay

## 2023-10-15 ENCOUNTER — Other Ambulatory Visit (HOSPITAL_COMMUNITY): Payer: Self-pay

## 2023-10-16 DIAGNOSIS — M533 Sacrococcygeal disorders, not elsewhere classified: Secondary | ICD-10-CM | POA: Diagnosis not present

## 2023-10-21 ENCOUNTER — Encounter: Payer: Self-pay | Admitting: Cardiology

## 2023-10-21 ENCOUNTER — Ambulatory Visit: Attending: Cardiology | Admitting: Cardiology

## 2023-10-21 VITALS — BP 135/85 | HR 77 | Resp 16 | Ht 66.0 in | Wt 221.0 lb

## 2023-10-21 DIAGNOSIS — Z955 Presence of coronary angioplasty implant and graft: Secondary | ICD-10-CM | POA: Diagnosis not present

## 2023-10-21 DIAGNOSIS — Z8673 Personal history of transient ischemic attack (TIA), and cerebral infarction without residual deficits: Secondary | ICD-10-CM

## 2023-10-21 DIAGNOSIS — I2510 Atherosclerotic heart disease of native coronary artery without angina pectoris: Secondary | ICD-10-CM

## 2023-10-21 DIAGNOSIS — Z01818 Encounter for other preprocedural examination: Secondary | ICD-10-CM | POA: Diagnosis not present

## 2023-10-21 DIAGNOSIS — Z794 Long term (current) use of insulin: Secondary | ICD-10-CM

## 2023-10-21 DIAGNOSIS — I252 Old myocardial infarction: Secondary | ICD-10-CM | POA: Diagnosis not present

## 2023-10-21 DIAGNOSIS — E1140 Type 2 diabetes mellitus with diabetic neuropathy, unspecified: Secondary | ICD-10-CM

## 2023-10-21 DIAGNOSIS — E66812 Obesity, class 2: Secondary | ICD-10-CM

## 2023-10-21 DIAGNOSIS — Z6835 Body mass index (BMI) 35.0-35.9, adult: Secondary | ICD-10-CM

## 2023-10-21 DIAGNOSIS — I10 Essential (primary) hypertension: Secondary | ICD-10-CM

## 2023-10-21 NOTE — Progress Notes (Signed)
 Cardiology Office Note:  .   Date:  10/21/2023  ID:  OKLA QAZI, DOB Nov 26, 1970, MRN [id number] PCP:  Gerome Brunet, DO  Former Cardiology Providers: Emmalene Lawrence, APRN, FNP-C  [facility] Providers Cardiologist:  Madonna Large, DO , [facility] (established care 04/07/2019) Electrophysiologist:  None  Click to update primary MD,subspecialty MD or APP then REFRESH:1}    Chief Complaint  Patient presents with   Atherosclerosis of native coronary artery of native heart w   Follow-up    History of Present Illness: Tina [name]   Tina [name] is a 53 y.o. African-American female whose past medical history and cardiovascular risk factors includes: NSTEMI treated with OM2 PCI 09/05/2020, OSA on CPAP, hypertension, insulin -dependent diabetes mellitus, h/o stroke 2011, obesity, hyperlipidemia, obesity due to excess calories.   In April 2022 patient presented with NSTEMI and underwent angiography was noted to have obstructive disease.  She underwent intervention to OM 2 and was placed on dual antiplatelet therapy.  She was last seen in the office back in July 2024 now presents for follow-up.  In the interim she was seen by APP in February 2025 for preoperative risk stratification for release of trigger finger/A1 pulley left ring finger. However, the surgery was postpone due to poor glycemic control. Once her sugars were better controlled she had back surgery and is recovering well. She plans to have trigger finger surgery later this month on 11/05/2023.  Since last office visit patient denies any anginal chest pain or heart failure symptoms.  No hospitalizations or urgent care visits for cardiovascular reasons.  She has been compliant with her medical therapy.  Physical endurance remains stable.  Patient has sleep apnea but currently not using her CPAP machine due to discomfort/inconvenience.  Recent echocardiogram results reviewed with her in detail at today's office visit.  Her husband provides  collateral history during today's encounter.  Review of Systems: .   Review of Systems  Cardiovascular:  Negative for chest pain, claudication, irregular heartbeat, leg swelling, near-syncope, orthopnea, palpitations, paroxysmal nocturnal dyspnea and syncope.  Respiratory:  Negative for shortness of breath.   Hematologic/Lymphatic: Negative for bleeding problem.    Studies Reviewed:   EKG: EKG Interpretation Date/Time:  Wednesday October 21 2023 14:57:17 EDT Ventricular Rate:  74 PR Interval:  160 QRS Duration:  70 QT Interval:  394 QTC Calculation: 437 R Axis:   0  Text Interpretation: Normal sinus rhythm When compared with ECG of 03-Sep-2023 18:43, No significant change was found Confirmed by Large Madonna [phone]) on 10/21/2023 2:59:00 PM  Echocardiogram: 09/06/2020: LVEF 60 to 65%. Indeterminate diastolic function. Right ventricular size and function normal. No significant valvular heart disease  Estimated RAP 3 mmHg.  07/2023:  1. Left ventricular ejection fraction, by estimation, is 55 to 60%. The  left ventricle has normal function. The left ventricle has no regional  wall motion abnormalities. Left ventricular diastolic parameters are  consistent with Grade I diastolic  dysfunction (impaired relaxation). The average left ventricular global  longitudinal strain is -22.1 %. The global longitudinal strain is normal.   2. Right ventricular systolic function is normal. The right ventricular  size is normal.   3. The mitral valve is normal in structure. Mild mitral valve  regurgitation.   4. The aortic valve is tricuspid. Aortic valve regurgitation is not  visualized. Aortic valve sclerosis/calcification is present, without any  evidence of aortic stenosis.    Stress Testing: Lexiscan  myoview  stress test 08/21/2017: Low risk study.   Heart Catheterization:  Coronary intervention 09/05/2020: LM: Normal LAD: Distal apical 70% stenosis (non-culprit, very small  caliber) Lcx: Mid Lcx 30% disease with just after a small OM1 branch (non-culprit)        Prox OM2 with 95% stenosis (culprit) RCA: Small caliber PDA with diffuse 60% disease LVEF 50-55% with apical inferolateral hypokinesis   Successful percutaneous coronary intervention OM2        PTCA and stent placement 2.25 X 12 mm Onyx drug-eluting stent        IVUS guided Post dilatation using 2.25 mm [place] at 22 atm and 2.5 mm Gerber at 16 atm   RADIOLOGY: N/A  Risk Assessment/Calculations:   NA   Labs:       Latest Ref Rng & Units 09/03/2023    7:10 PM 06/05/2021   11:13 AM 12/14/2020   10:05 AM  CBC  WBC 4.0 - 10.5 K/uL 7.6  8.8  9.4   Hemoglobin 12.0 - 15.0 g/dL 89.5  85.4  84.8   Hematocrit 36.0 - 46.0 % 33.1  43.8  46.9   Platelets 150 - 400 K/uL 266  342  371        Latest Ref Rng & Units 09/03/2023    7:10 PM 05/22/2023   12:41 PM 06/05/2021   11:13 AM  BMP  Glucose 70 - 99 mg/dL 94  44  896   BUN 6 - 20 mg/dL 19  21  9    Creatinine 0.44 - 1.00 mg/dL 8.28  8.16  8.93   BUN/Creat Ratio 9 - 23  11    Sodium 135 - 145 mmol/L 138  143  140   Potassium 3.5 - 5.1 mmol/L 4.3  3.9  3.7   Chloride 98 - 111 mmol/L 105  106  107   CO2 22 - 32 mmol/L 25  24  24    Calcium  8.9 - 10.3 mg/dL 8.9  9.6  8.9       Latest Ref Rng & Units 09/03/2023    7:10 PM 05/22/2023   12:41 PM 06/05/2021   11:13 AM  CMP  Glucose 70 - 99 mg/dL 94  44  896   BUN 6 - 20 mg/dL 19  21  9    Creatinine 0.44 - 1.00 mg/dL 8.28  8.16  8.93   Sodium 135 - 145 mmol/L 138  143  140   Potassium 3.5 - 5.1 mmol/L 4.3  3.9  3.7   Chloride 98 - 111 mmol/L 105  106  107   CO2 22 - 32 mmol/L 25  24  24    Calcium  8.9 - 10.3 mg/dL 8.9  9.6  8.9   Total Protein 6.5 - 8.1 g/dL   6.8   Total Bilirubin 0.3 - 1.2 mg/dL   0.9   Alkaline Phos 38 - 126 U/L   49   AST 15 - 41 U/L   18   ALT 0 - 44 U/L   15     Lab Results  Component Value Date   CHOL 175 09/06/2020   HDL 56 09/06/2020   LDLCALC 92 09/06/2020   TRIG 137  09/06/2020   CHOLHDL 3.1 09/06/2020   No results for input(s): LIPOA in the last 8760 hours. No components found for: NTPROBNP Recent Labs    05/22/23 1241  PROBNP 99   No results for input(s): TSH in the last 8760 hours.  Physical Exam:    Today's Vitals   10/21/23 1420  BP: 135/85  Pulse: 77  Resp: 16  SpO2: 99%  Weight: 221 lb (100.2 kg)  Height: 5' 6 (1.676 m)   Body mass index is 35.67 kg/m. Wt Readings from Last 3 Encounters:  10/21/23 221 lb (100.2 kg)  06/19/23 248 lb (112.5 kg)  04/08/23 258 lb (117 kg)    Physical Exam  Constitutional: No distress.  hemodynamically stable  Neck: No JVD present.  Cardiovascular: Normal rate, regular rhythm, S1 normal and S2 normal. Exam reveals no gallop, no S3 and no S4.  No murmur heard. Pulmonary/Chest: Effort normal and breath sounds normal. No stridor. She has no wheezes. She has no rales.  Musculoskeletal:        General: No edema.     Cervical back: Neck supple.  Skin: Skin is warm.     Impression & Recommendation(s):  Impression:   ICD-10-CM   1. Atherosclerosis of native coronary artery of native heart without angina pectoris  I25.10 EKG 12-Lead    2. Hx of heart artery stent  Z95.5     3. Essential hypertension  I10     4. History of stroke  Z86.73     5. Type 2 diabetes mellitus with diabetic neuropathy, with long-term current use of insulin  (HCC)  E11.40    Z79.4     6. Long-term insulin  use (HCC)  Z79.4     7. History of non-ST elevation myocardial infarction (NSTEMI)  I25.2     8. Class 2 severe obesity due to excess calories with serious comorbidity and body mass index (BMI) of 35.0 to 35.9 in adult  E66.812    Z68.35     9. Preoperative clearance  Z01.818 EKG 12-Lead       Recommendation(s):  Atherosclerosis of native coronary artery of native heart without angina pectoris History of NSTEMI Hx of heart artery stent Denies anginal chest pain or heart failure symptoms. EKG is  nonischemic. Currently on dual antiplatelet therapy as recommended by interventional cardiology and given the size of the stent location.  Patient does not endorse evidence of bleeding. Echo July 2025: Preserved LVEF, grade 1 diastolic dysfunction, no significant valvular heart disease Antianginal therapy includes: Amlodipine  5 mg p.o. daily, carvedilol , Imdur  60 mg p.o. daily No additional workup warranted at this time  Preoperative clearance Patient plans to have her trigger finger surgery later this month. Patient is considered acceptable risk for upcoming noncardiac surgery. Can hold aspirin  and Plavix  5 days prior to the procedure, or longer as recommended by her surgeon to minimize risk of bleeding.  Recommending restarting antiplatelets postsurgery once cleared by her surgeon. EKG is nonischemic Her Functional Capacity in METs is: 5.72 according to the Duke Activity Status Index (DASI).  Essential hypertension Follow-up with blood pressures are very well-controlled. Continue amlodipine  5 mg p.o. daily. Continue carvedilol  25 mg p.o. twice daily. Continue Jardiance 25 mg p.o. daily. Continue Imdur  60 mg p.o. daily. Continue hydralazine  10 mg p.o. 3 times daily. Continue Lasix  40 mg p.o. daily. Continue spironolactone  25 mg p.o. daily Reemphasized importance of utilizing her CPAP to address her sleep apnea.  May follow-up with her sleep provider for better fitting  Type 2 diabetes mellitus with diabetic neuropathy, with long-term current use of insulin  (HCC) Long-term insulin  use (HCC) Reemphasized the importance of glycemic control. Did not tolerate Ozempic  secondary to gastritis. Currently on Mounjaro . Not on ARB secondary to allergies. Continue Jardiance, Lipitor  Class 2 severe obesity due to excess calories with serious comorbidity and body mass index (BMI) of 35.0 to  35.9 in adult Last 29 pounds since last office visit, congratulated on her efforts as she progresses in her  weight loss journey. Body mass index is 35.67 kg/m. I reviewed with her importance of diet, regular physical activity/exercise, weight loss.   Patient is educated on the importance of increasing physical activity gradually as tolerated with a goal of moderate intensity exercise for 30 minutes a day 5 days a week.  Orders Placed:  Orders Placed This Encounter  Procedures   EKG 12-Lead   Final Medication List:   No orders of the defined types were placed in this encounter.   There are no discontinued medications.   Current Outpatient Medications:    albuterol  (VENTOLIN  HFA) 108 (90 Base) MCG/ACT inhaler, Inhale 2 puffs into the lungs every 4 (four) hours as needed for wheezing or shortness of breath., Disp: 18 g, Rfl: 1   allopurinol  (ZYLOPRIM ) 100 MG tablet, Take 100 mg by mouth daily., Disp: , Rfl:    amLODipine  (NORVASC ) 5 MG tablet, Take 1 tablet (5 mg total) by mouth daily., Disp: 90 tablet, Rfl: 0   aspirin  EC 81 MG tablet, Take 81 mg by mouth daily. Swallow whole., Disp: , Rfl:    atorvastatin  (LIPITOR) 40 MG tablet, Take 1 tablet (40 mg total) by mouth at bedtime., Disp: 90 tablet, Rfl: 0   carvedilol  (COREG ) 25 MG tablet, Take 1 tablet by mouth 2 (two) times daily with a meal., Disp: , Rfl:    cetirizine  (ZYRTEC ) 10 MG tablet, Take 10 mg by mouth daily as needed for allergies (itching)., Disp: , Rfl:    clopidogrel  (PLAVIX ) 75 MG tablet, TAKE ONE TABLET BY MOUTH ONCE DAILY, Disp: 30 tablet, Rfl: 0   colchicine  0.6 MG tablet, Take 0.6 mg by mouth daily as needed., Disp: , Rfl:    Continuous Blood Gluc Sensor (FREESTYLE LIBRE 2 SENSOR) MISC, Inject 1 Device into the skin every 14 (fourteen) days., Disp: , Rfl:    DULoxetine  (CYMBALTA ) 20 MG capsule, Take 20-40 mg by mouth See admin instructions. Take 20 mg by mouth in the morning and 40 mg at bedtime, Disp: , Rfl:    empagliflozin (JARDIANCE) 25 MG TABS tablet, 1 tablet, Disp: , Rfl:    EPINEPHrine  (AUVI-Q ) 0.3 mg/0.3 mL IJ SOAJ  injection, Inject 0.3 mg into the muscle as needed for anaphylaxis., Disp: 1 each, Rfl: 1   famotidine  (PEPCID ) 20 MG tablet, Take 1 tablet by mouth 2 (two) times daily., Disp: , Rfl:    furosemide  (LASIX ) 40 MG tablet, Take 40 mg by mouth in the morning., Disp: , Rfl:    gabapentin  (NEURONTIN ) 300 MG capsule, Take 300 mg by mouth at bedtime., Disp: , Rfl:    hydrALAZINE  (APRESOLINE ) 10 MG tablet, TAKE ONE TABLET BY MOUTH THREE TIMES DAILY, Disp: 90 tablet, Rfl: 0   Insulin  Aspart FlexPen (NOVOLOG ) 100 UNIT/ML, Inject 0-25 Units into the skin 3 (three) times daily before meals. Dose per sliding scale., Disp: , Rfl:    isosorbide  mononitrate (IMDUR ) 60 MG 24 hr tablet, TAKE 1 TABLET BY MOUTH ONCE DAILY, Disp: 30 tablet, Rfl: 11   methocarbamol  (ROBAXIN ) 500 MG tablet, Take 500-1,000 mg by mouth every 6 (six) hours as needed., Disp: , Rfl:    pantoprazole  (PROTONIX ) 40 MG tablet, Take 1 tablet (40 mg total) by mouth daily. Please schedule an office visit for further refills, Disp: 30 tablet, Rfl: 1   spironolactone  (ALDACTONE ) 25 MG tablet, Take 25 mg by mouth daily., Disp: ,  Rfl:    tirzepatide  (MOUNJARO ) 10 MG/0.5ML Pen, Inject 10 mg into the skin once a week., Disp: 2 mL, Rfl: 11   tiZANidine  (ZANAFLEX ) 2 MG tablet, TAKE 1 TABLET BY MOUTH AT BEDTIME, Disp: 30 tablet, Rfl: 11   traMADol  (ULTRAM ) 50 MG tablet, Take 1 tablet (50 mg total) by mouth 2 (two) times daily., Disp: 60 tablet, Rfl: 5 No current facility-administered medications for this visit.  Facility-Administered Medications Ordered in Other Visits:    cefTAZidime  (FORTAZ ) intravitreal injection, , , PRN, Tobie Baptist, MD, 2.25 mg at 01/09/23 1115   vancomycin  (VANCOCIN ) intravitreal injection, , , PRN, Patel, Narendra, MD, 1 mg at 01/09/23 1115  Consent:   NA  Disposition:   1 year follow-up sooner if needed  Her questions and concerns were addressed to her satisfaction. She voices understanding of the recommendations provided  during this encounter.    Signed, Madonna Michele HAS, [facility]  A Division of [facility] [address], [place], [place] [address]  10/21/2023 5:39 PM

## 2023-10-21 NOTE — Patient Instructions (Addendum)
 Medication Instructions:  Your physician recommends that you continue on your current medications as directed. Please refer to the Current Medication list given to you today.  *If you need a refill on your cardiac medications before your next appointment, please call your pharmacy*  Lab Work: None If you have labs (blood work) drawn today and your tests are completely normal, you will receive your results only by: MyChart Message (if you have MyChart) OR A paper copy in the mail If you have any lab test that is abnormal or we need to change your treatment, we will call you to review the results.  Testing/Procedures: None      Follow-Up: At [facility], you and your health needs are our priority.  As part of our continuing mission to provide you with exceptional heart care, our providers are all part of one team.  This team includes your primary Cardiologist (physician) and Advanced Practice Providers or APPs (Physician Assistants and Nurse Practitioners) who all work together to provide you with the care you need, when you need it.  Your next appointment:   12 month(s)  Provider:   Madonna Large, DO    We recommend signing up for the patient portal called MyChart.  Sign up information is provided on this After Visit Summary.  MyChart is used to connect with patients for Virtual Visits (Telemedicine).  Patients are able to view lab/test results, encounter notes, upcoming appointments, etc.  Non-urgent messages can be sent to your provider as well.   To learn more about what you can do with MyChart, go to [email or web address].   Other Instructions HOLD ASPIRIN  81 MG 5 DAYS PRIOR TO SURGERY HOLD CLOPIDOGREL  75 MG 5 DAYS PRIOR TO SURGERY  RESTART BOTH AFTER CLEARED FROM SURGERY    Follow up with your Sleep Provider for Sleep Apnea management.

## 2023-10-31 ENCOUNTER — Emergency Department
Admission: EM | Admit: 2023-10-31 | Discharge: 2023-11-01 | Disposition: A | Attending: Emergency Medicine | Admitting: Emergency Medicine

## 2023-10-31 ENCOUNTER — Other Ambulatory Visit: Payer: Self-pay

## 2023-10-31 DIAGNOSIS — S91102A Unspecified open wound of left great toe without damage to nail, initial encounter: Secondary | ICD-10-CM | POA: Insufficient documentation

## 2023-10-31 DIAGNOSIS — I10 Essential (primary) hypertension: Secondary | ICD-10-CM | POA: Diagnosis not present

## 2023-10-31 DIAGNOSIS — Z7902 Long term (current) use of antithrombotics/antiplatelets: Secondary | ICD-10-CM | POA: Diagnosis not present

## 2023-10-31 DIAGNOSIS — X58XXXA Exposure to other specified factors, initial encounter: Secondary | ICD-10-CM | POA: Diagnosis not present

## 2023-10-31 DIAGNOSIS — E119 Type 2 diabetes mellitus without complications: Secondary | ICD-10-CM | POA: Insufficient documentation

## 2023-10-31 DIAGNOSIS — Z7982 Long term (current) use of aspirin: Secondary | ICD-10-CM | POA: Diagnosis not present

## 2023-10-31 DIAGNOSIS — S91112A Laceration without foreign body of left great toe without damage to nail, initial encounter: Secondary | ICD-10-CM | POA: Diagnosis not present

## 2023-10-31 MED ORDER — SILVER NITRATE-POT NITRATE 75-25 % EX MISC
3.0000 | Freq: Once | CUTANEOUS | Status: AC
  Administered 2023-11-01: 3 via TOPICAL
  Filled 2023-10-31: qty 10

## 2023-10-31 NOTE — ED Notes (Signed)
 Fall risk bundle in place.

## 2023-10-31 NOTE — ED Triage Notes (Addendum)
 Pt presented to ED with c/o bleeding from left toe ongoing x 1.5 hours. States unable to get toe to stop bleeding. On Aspirin  and plavix . Bulky dressing with pressure dressing applied.

## 2023-10-31 NOTE — ED Provider Notes (Signed)
   [facility] Provider Note    Event Date/Time   First MD Initiated Contact with Patient 10/31/23 2334     (approximate)  History   Chief Complaint: Toe Injury  HPI  Aubri ARMETTA HENRI is a 53 y.o. female with a past medical history of diabetes, hypertension, hyperlipidemia, anemia on aspirin /Plavix  who presents to the emergency department for a bleeding left great toe.  According to the patient she has a bad habit of picking skin off her toes.  Patient states she had an area of skin sticking up and she ripped it out which caused the toe to start bleeding.  Patient was unable to get the bleeding to stop so she came to the emergency department for evaluation.  Physical Exam   Triage Vital Signs: ED Triage Vitals [10/31/23 2329]  Encounter Vitals Group     BP 105/63     Girls Systolic BP Percentile      Girls Diastolic BP Percentile      Boys Systolic BP Percentile      Boys Diastolic BP Percentile      Pulse Rate 86     Resp 18     Temp 97.9 F (36.6 C)     Temp Source Oral     SpO2 (!) 88 %     Weight 219 lb (99.3 kg)     Height 5' 6 (1.676 m)     Head Circumference      Peak Flow      Pain Score 0     Pain Loc      Pain Education      Exclude from Growth Chart     Most recent vital signs: Vitals:   10/31/23 2329  BP: 105/63  Pulse: 86  Resp: 18  Temp: 97.9 F (36.6 C)  SpO2: (!) 88%    General: Awake, no distress.  CV:  Good peripheral perfusion. Other:  Patient's left great toe has what appears to be a very small possibly arterial bleed at the base of the nailbed/cuticle where she has ripped off a piece of skin.  Continues to have moderate bleeding.   ED Results / Procedures / Treatments   MEDICATIONS ORDERED IN ED: Medications  silver nitrate applicators applicator 3 Stick (has no administration in time range)     IMPRESSION / MDM / ASSESSMENT AND PLAN / ED COURSE  I reviewed the triage vital signs and the nursing  notes.  Patient's presentation is most consistent with acute illness / injury with system symptoms.  Patient presents emergency department for continued bleeding of her left great toe.  On examination there is a small area possibly consistent with a very tiny arterial bleed with mild to moderate bleed.  Appears that she has ripped the skin off this area.  Will apply silver nitrate to chemically cauterize.  Patient agreeable to plan.  I applied silver nitrate.  Was able to achieve hemostasis.  I then covered in Dermabond.  We will cover the remaining areas of ripped skin which appears likely chronic with Xeroform to help prevent any infection.  Will discharge home.  Patient agreeable to plan.  FINAL CLINICAL IMPRESSION(S) / ED DIAGNOSES   Hemorrhage, left great toe   Note:  This document was prepared using Dragon voice recognition software and may include unintentional dictation errors.   Dorothyann Drivers, MD 11/01/23 0001

## 2023-11-01 NOTE — Discharge Instructions (Addendum)
 Please attempt keep the toe dry for the next 24 hours.  After which you may gently wash and allow to air dry do not rub as the glue may be removed.  Return for any significant bleeding.

## 2023-11-03 DIAGNOSIS — E1165 Type 2 diabetes mellitus with hyperglycemia: Secondary | ICD-10-CM | POA: Diagnosis not present

## 2023-11-03 DIAGNOSIS — E1122 Type 2 diabetes mellitus with diabetic chronic kidney disease: Secondary | ICD-10-CM | POA: Diagnosis not present

## 2023-11-03 DIAGNOSIS — R58 Hemorrhage, not elsewhere classified: Secondary | ICD-10-CM | POA: Diagnosis not present

## 2023-11-03 DIAGNOSIS — E1169 Type 2 diabetes mellitus with other specified complication: Secondary | ICD-10-CM | POA: Diagnosis not present

## 2023-11-04 ENCOUNTER — Encounter (HOSPITAL_COMMUNITY): Payer: Self-pay | Admitting: Orthopedic Surgery

## 2023-11-04 ENCOUNTER — Other Ambulatory Visit: Payer: Self-pay

## 2023-11-04 DIAGNOSIS — E113512 Type 2 diabetes mellitus with proliferative diabetic retinopathy with macular edema, left eye: Secondary | ICD-10-CM | POA: Diagnosis not present

## 2023-11-04 NOTE — Anesthesia Preprocedure Evaluation (Signed)
 Anesthesia Evaluation  Patient identified by MRN, date of birth, ID band Patient awake    Reviewed: Allergy & Precautions, NPO status , Patient's Chart, lab work & pertinent test results, reviewed documented beta blocker date and time   Airway Mallampati: III  TM Distance: >3 FB     Dental  (+) Edentulous Upper, Edentulous Lower   Pulmonary shortness of breath and with exertion   breath sounds clear to auscultation + decreased breath sounds      Cardiovascular hypertension, Pt. on medications and Pt. on home beta blockers + CAD, + Cardiac Stents, + Peripheral Vascular Disease and +CHF  Normal cardiovascular exam Rhythm:Regular Rate:Normal  CAD (s/p DES LAD 06/16/07; NSTEMI, s/p DES LCX, EF 45% 05/13/19) Ischemic cardiomyopathy  TTE which was done on 10/16/2023 showing LVEF 40-45% Grade I DD  EKG 11/05/23 Normal sinus rhythm Left axis deviation Anterior infarct , age undetermined   Neuro/Psych  PSYCHIATRIC DISORDERS  Depression    Wheelchair bound Right hemiplegia/hemiparesis Peripheral neuropathy CVA, Residual Symptoms    GI/Hepatic Neg liver ROS,,,  Endo/Other  diabetes, Well Controlled, Type 2, Oral Hypoglycemic Agents  Gout Hyperlipidemia  Renal/GU negative Renal ROS  negative genitourinary   Musculoskeletal  (+) Arthritis , Osteoarthritis,  Left biceps tendinosis OA left shoulder   Abdominal  (+) + obese  Peds  Hematology  (+) Blood dyscrasia, anemia Plavix therapy- last dose more than a week ago   Anesthesia Other Findings   Reproductive/Obstetrics                              Anesthesia Physical Anesthesia Plan  ASA: 3  Anesthesia Plan: General   Post-op Pain Management: Regional block* and Minimal or no pain anticipated   Induction: Intravenous  PONV Risk Score and Plan: 3 and Treatment may vary due to age or medical condition and Ondansetron   Airway Management Planned:  Oral ETT  Additional Equipment: None  Intra-op Plan:   Post-operative Plan: Extubation in OR  Informed Consent: I have reviewed the patients History and Physical, chart, labs and discussed the procedure including the risks, benefits and alternatives for the proposed anesthesia with the patient or authorized representative who has indicated his/her understanding and acceptance.       Plan Discussed with: Anesthesiologist and CRNA  Anesthesia Plan Comments: (PAT note written 11/04/2023 by Kaileia Flow, PA-C.  )         Anesthesia Quick Evaluation  ring finger No date: Restless leg syndrome     Comment:  DIAGNOSED BY SLEEP STUDY - PT TOLD SHE DID NOT HAVE               SLEEP APNEA No date: Right rotator cuff tear     Comment:  PAIN IN RIGHT SHOULDER 06/03/2012: SBO (small bowel obstruction) (HCC) No date: Shortness of breath     Comment:  hx No date: Sleep apnea     Comment:  does not use CPAP No date: Spastic hemiplegia affecting dominant side (HCC) No date: Stomach problems No date: Stroke (HCC) No date: Ventral hernia     Comment:  RIGHT LOWER QUADRANT-CAUSING SOME PAIN No date: Weakness of right side of body   Reproductive/Obstetrics                              Anesthesia Physical Anesthesia Plan  ASA: 3  Anesthesia Plan: General    Post-op Pain Management: Ofirmev  IV (intra-op)*   Induction: Intravenous  PONV Risk Score and Plan: 3 and Treatment may vary due to age or medical condition, Ondansetron , Dexamethasone  and Midazolam   Airway Management Planned: LMA  Additional Equipment: None  Intra-op Plan:   Post-operative Plan: Extubation in OR  Informed Consent: I have reviewed the patients History and Physical, chart, labs and discussed the procedure including the risks, benefits and alternatives for the proposed anesthesia with the patient or authorized representative who has indicated his/her understanding and acceptance.     Dental advisory given  Plan Discussed with: CRNA and Anesthesiologist  Anesthesia Plan Comments: (Discussed risks of anesthesia with patient, including PONV, sore throat, lip/dental/eye damage. Rare risks discussed as well, such as cardiorespiratory and neurological sequelae, and allergic reactions. Discussed the role of CRNA in patient's perioperative care. Patient understands.)         Anesthesia Quick Evaluation

## 2023-11-04 NOTE — Progress Notes (Signed)
 Anesthesia Chart Review: SAME DAY WORK-UP  Case: [id number] Date/Time: 11/05/23 0745   Procedure: RELEASE, A1 PULLEY, FOR TRIGGER FINGER (Left: Ring Finger)   Anesthesia type: General   Diagnosis: Trigger finger, left ring finger [M65.342]   Pre-op diagnosis: LEFT RING FINGER TRIGGER DIGIT   [facility] / [facility]   Surgeons: Murrell Drivers, MD       DISCUSSION: Patient is a 53 year old female scheduled for the above procedure.  Surgery had been planned in February 2025 but postponed due to poor glycemic control. Per Care Everywhere records from August 2025, A1c down to 8.8% from 11.6%.  History includes smoker, HTN, IDDM2 (with neuropathy), HLD, CAD (NSTEMI, s/p DES pOM2 09/05/2020), CVA (left pontine infarct 06/2009, right hemiparesis), CKD, OSA (not currently using CPAP), dyspnea, fatty liver, lower extremity edema, chronic back pain, anemia, panic disorder, ventral hernia (s/p repair 10/03/2011, 02/04/2013 requiring drain for seroma->excision of chronic seroma 05/25/2015), angioedema (recurrent with multiple allergies).  Last visit with cardiologist Dr. Michele was on 10/21/2023 for follow-up CAD and for preoperative evaluation.  He wrote, Preoperative clearance Patient plans to have her trigger finger surgery later this month. Patient is considered acceptable risk for upcoming noncardiac surgery. Can hold aspirin  and Plavix  5 days prior to the procedure, or longer as recommended by her surgeon to minimize risk of bleeding.  Recommending restarting antiplatelets postsurgery once cleared by her surgeon. EKG is nonischemic Her Functional Capacity in METs is: 5.72 according to the [employer] Activity Status Index (DASI).   She reported last aspirin  and Plavix  is 10/29/2023.  Last Mounjaro  dose documented as 10/22/2023.  Anesthesia team to evaluate on the day of surgery.  She is for updated labs on arrival as indicated.  VS: Ht 5' 6 (1.676 m)   Wt 100.2 kg   BMI 35.65 kg/m  BP Readings from  Last 3 Encounters:  11/01/23 (!) 104/52  10/21/23 135/85  09/04/23 (!) 145/72   Pulse Readings from Last 3 Encounters:  11/01/23 78  10/21/23 77  09/04/23 75     PROVIDERS: Gerome Brunet, DO is PCP  Michele Richardson, DO is cardiologist - Notes indicates she is followed by nephrology at [facility].   LABS: For day of surgery as indicated.  Most recent results in [facility] include: Lab Results  Component Value Date   WBC 7.6 09/03/2023   HGB 10.4 (L) 09/03/2023   HCT 33.1 (L) 09/03/2023   PLT 266 09/03/2023   GLUCOSE 94 09/03/2023   NA 138 09/03/2023   K 4.3 09/03/2023   CL 105 09/03/2023   CREATININE 1.71 (H) 09/03/2023   BUN 19 09/03/2023   CO2 25 09/03/2023   By notes, as of August 2025 A1c 8.8%.   OTHER: Spirometry 08/16/2020: FVC 2.36, FEV1 1.95. Predicted FVC 3.12, predicted FEV1 2.50. Spirometry indicates mild restriction. Postbronchodilator FVC 2.28, FEV1 2.10. Postbronchodilator indicates no significant bronchodilator response.   Home Sleep Study 11/14/2019: This HST confirmed a mild degree of sleep apnea and failed to give data of REM sleep dependent OSA.  Review of the hypnogram shows oxygen desaturations being clustered in REM sleep phases but there was no clinically significant total oxygen desaturation time. It appears that the patient was asleep for about 6 hours.   Recommendations:  This mild sleep apnea should be treated in order to allow differentiation of causes of hypersomnia- I will order an autotitration CPAP device with a setting from 5-15 and 2 cm EPR, heated humidification, mask of her choice. -  RV with NP in the 3rd month of CPAP therapy- obtain Epworth score again to evaluate success of treatment.   EKG: 10/21/2023: Normal sinus rhythm When compared with ECG of 03-Sep-2023 18:43, No significant change was found Confirmed by Michele Richardson [phone]) on 10/21/2023 2:59:00 PM   CV: Echo 08/03/2023: IMPRESSIONS   1. Left ventricular  ejection fraction, by estimation, is 55 to 60%. The  left ventricle has normal function. The left ventricle has no regional  wall motion abnormalities. Left ventricular diastolic parameters are  consistent with Grade I diastolic  dysfunction (impaired relaxation). The average left ventricular global  longitudinal strain is -22.1 %. The global longitudinal strain is normal.   2. Right ventricular systolic function is normal. The right ventricular  size is normal.   3. The mitral valve is normal in structure. Mild mitral valve  regurgitation.   4. The aortic valve is tricuspid. Aortic valve regurgitation is not  visualized. Aortic valve sclerosis/calcification is present, without any  evidence of aortic stenosis.    Cardiac cath 09/05/2020: LM: Normal LAD: Distal apical 70% stenosis (non-culprit, very small caliber) Lcx: Mid Lcx 30% disease with just after a small OM1 branch (non-culprit)        Prox OM2 with 95% stenosis (culprit) RCA: Small caliber PDA with diffuse 60% disease LVEF 50-55% with apical inferolateral hypokinesis   Successful percutaneous coronary intervention OM2        PTCA and stent placement 2.25 X 12 mm Onyx drug-eluting stent        IVUS guided Post dilatation using 2.25 mm [place] at 22 atm and 2.5 mm [place] at 16 atm   US  Carotid 02/21/2016: Impression: This study is negative for hemodynamically significant stenosis involving extracranial carotid arteries bilaterally.   Past Medical History:  Diagnosis Date   Allergy    Anemia    DURING MENSES--HAS HEAVY BLEEDING WITH PERODS   Angioedema 08/13/2020   Anxiety    Back pain, chronic    ongoing   Blood transfusion    IN 2012  AFTER C -SECTION   Cerebral thrombosis with cerebral infarction (HCC) 06/2009   RIGHT SIDED WEAKNESS ( ARM AND LEG ) AND SPASMS-remains with slight weakness and vertigo.   Constipation    Depression    Diabetes mellitus    Diabetic neuropathy (HCC)    BOTH FEET --COMES AND GOES   Edema,  lower extremity    Endometriosis    Fatty liver    GERD (gastroesophageal reflux disease)    with pregnancy   H/O eye surgery    Headache(784.0)    MIGRAINES--NOT REALLY HEADACHE-MORE LIKE PRESSURE SENSATION IN HEAD-FEELS DIZZIY AND  FAINT AS THE PRESSURE RESOLVES   Hernia, incisional, RLQ, s/p lap repair Sep 2013 09/02/2011   History of vertigo 03/22/2018   Hx of migraines 10/19/2011   Hyperlipidemia    Hypertension    Leg pain, right    like bad Charley horse   Multiple food allergies    Panic disorder without agoraphobia    Rash    HANDS, ARMS --STATES HX OF RASH EVER SINCE CHILDBIRTH/PREGNANCY.  STATES THE RASH OFTEN OCCURS WHEN SHE IS REALLY STRESSED.goes and comes-presently left ring finger   Restless leg syndrome    DIAGNOSED BY SLEEP STUDY - PT TOLD SHE DID NOT HAVE SLEEP APNEA   Right rotator cuff tear    PAIN IN RIGHT SHOULDER   SBO (small bowel obstruction) (HCC) 06/03/2012   Shortness of breath    Sleep apnea  Spastic hemiplegia affecting dominant side (HCC)    Stomach problems    Stroke (HCC)    Ventral hernia    RIGHT LOWER QUADRANT-CAUSING SOME PAIN   Weakness of right side of body     Past Surgical History:  Procedure Laterality Date   APPLICATION OF WOUND VAC N/A 05/25/2015   Procedure: APPLICATION OF WOUND VAC;  Surgeon: Elspeth Schultze, MD;  Location: [facility] ORS;  Service: General;  Laterality: N/A;   CARDIAC CATHETERIZATION     CESAREAN SECTION  2012   COLONOSCOPY     CORONARY STENT INTERVENTION N/A 09/05/2020   Procedure: CORONARY STENT INTERVENTION;  Surgeon: Elmira Newman PARAS, MD;  [facility]: [facility] INVASIVE CV LAB;  Service: Cardiovascular;  Laterality: N/A;   CORONARY ULTRASOUND/IVUS N/A 09/05/2020   Procedure: Intravascular Ultrasound/IVUS;  Surgeon: Elmira Newman PARAS, MD;  [facility]: [facility] CV [facility];  Service: Cardiovascular;  Laterality: N/A;   DIAGNOSTIC LAPAROSCOPY     ESOPHAGOGASTRODUODENOSCOPY N/A 06/03/2012   Procedure:  ESOPHAGOGASTRODUODENOSCOPY (EGD);  Surgeon: Renaye Sous, MD;  Location: [facility] ENDOSCOPY;  Service: Endoscopy;  Laterality: N/A;   EXCISION MASS ABDOMINAL N/A 05/25/2015   Procedure: ABDOMINAL WALL EXPLORATION EXCISION OF SEROMA REMOVAL OF REDUNDANT SKIN ;  Surgeon: Elspeth Schultze, MD;  Location: [facility]S;  Service: General;  Laterality: N/A;   EYE SURGERY     Eye laser for vessel hemorrhaging   fybroid removal     HERNIA REPAIR  10/03/2011   ventral hernia repair   INJECTION, INTRAVITREAL, THERAPEUTIC AGENT, EYE Left 01/09/2023   Procedure: Injection, Intravitreal, therapeutic agent, eye;  Surgeon: Tobie Baptist, MD;  Location: [facility] OR;  Service: Ophthalmology;  Laterality: Left;   INSERTION OF MESH N/A 02/04/2013   Procedure: INSERTION OF MESH;  Surgeon: Elspeth KYM Schultze, MD;  Location: [facility];  Service: General;  Laterality: N/A;   LEFT HEART CATH AND CORONARY ANGIOGRAPHY N/A 09/05/2020   Procedure: LEFT HEART CATH AND CORONARY ANGIOGRAPHY;  Surgeon: Elmira Newman PARAS, MD;  [facility];  Service: Cardiovascular;  Laterality: N/A;   PARS PLANA VITRECTOMY Left 08/24/2022   Procedure: PARS PLANA VITRECTOMY 25 GAUGE FOR ENDOPHTHALMITIS; INJECTION OF ANTIBIOTIC;  Surgeon: Tobie Baptist, MD;  Location: [facility] OR;  Service: Ophthalmology;  Laterality: Left;   UMBILICAL HERNIA REPAIR N/A 02/04/2013   Procedure: LAPAROSCOPIC ventral wall hernia repair LAPAROSCOPIC LYSIS OF ADHESIONS laparoscopic exploration of abdomen ;  Surgeon: Elspeth KYM Schultze, MD;  Location: [facility]S;  Service: General;  Laterality: N/A;   UPPER GASTROINTESTINAL ENDOSCOPY     URETER REVISION     Bilateral twisted   UTERINE FIBROID SURGERY     2 SURGERIES FOR FIBROIDS   VENTRAL HERNIA REPAIR  10/03/2011   Procedure: LAPAROSCOPIC VENTRAL HERNIA;  Surgeon: Elspeth KYM Schultze, MD;  Location: [facility]S;  Service: General;  Laterality: N/A;    MEDICATIONS: No current facility-administered medications for this encounter.     allopurinol  (ZYLOPRIM ) 100 MG tablet   amLODipine  (NORVASC ) 5 MG tablet   aspirin  EC 81 MG tablet   atorvastatin  (LIPITOR) 40 MG tablet   carvedilol  (COREG ) 25 MG tablet   clopidogrel  (PLAVIX ) 75 MG tablet   colchicine  0.6 MG tablet   DULoxetine  (CYMBALTA ) 20 MG capsule   EPINEPHrine  (AUVI-Q ) 0.3 mg/0.3 mL IJ SOAJ injection   famotidine  (PEPCID ) 20 MG tablet   furosemide  (LASIX ) 40 MG tablet   gabapentin  (NEURONTIN ) 300 MG capsule   hydrALAZINE  (APRESOLINE ) 10 MG tablet   Insulin  Aspart FlexPen (NOVOLOG ) 100 UNIT/ML  isosorbide  mononitrate (IMDUR ) 60 MG 24 hr tablet   spironolactone  (ALDACTONE ) 25 MG tablet   tirzepatide  (MOUNJARO ) 10 MG/0.5ML Pen   tiZANidine  (ZANAFLEX ) 2 MG tablet   traMADol  (ULTRAM ) 50 MG tablet   albuterol  (VENTOLIN  HFA) 108 (90 Base) MCG/ACT inhaler   Continuous Blood Gluc Sensor (FREESTYLE LIBRE 2 SENSOR) MISC    cefTAZidime  (FORTAZ ) intravitreal injection   vancomycin  (VANCOCIN ) intravitreal injection    Isaiah Ruder, PA-C Surgical Short Stay/Anesthesiology [facility] Phone [phone] [facility] Phone [phone] 11/04/2023 2:24 PM

## 2023-11-04 NOTE — Progress Notes (Addendum)
 PCP - Marston Collet, DO Cardiologist - Sunit Tolia, DO (clearance on 10/21/23)  Chest x-ray - n/a EKG - 10/21/23 Stress Test - 08/21/17 ECHO - 08/03/23 Cardiac Cath - 09/05/20  ICD Pacemaker/Loop - n/a  Sleep Study -  Yes CPAP - does not use CPAP, pt to get another sleep study due to weight loss.  Diabetes Type 2   FreeStyle Libre 2 System, sensor on left arm   THE MORNING OF SURGERY, do take Novolog  Insulin  unless your CBG is greater than 220 mg/dL.  If > 220 mg/dL, you may take  of your sliding scale (correction) dose of insulin .  Plavix  Instructions:  Last dose was on 10/29/23.  Aspirin  Instructions: Last dose was on 10/29/23.  ERAS - clear liquids til 0500 DOS.  Anesthesia review: Yes  STOP now taking any Aspirin  (unless otherwise instructed by your surgeon), Aleve , Naproxen , Ibuprofen , Motrin , Advil , Goody's, BC's, all herbal medications, fish oil, and all vitamins.   Coronavirus Screening Do you have any of the following symptoms:  Cough yes/no: No Fever (>100.24F)  yes/no: No Runny nose yes/no: No Sore throat yes/no: No Difficulty breathing/shortness of breath  yes/no: No  Have you traveled in the last 14 days and where? yes/no: No  Patient verbalized understanding of instructions that were given via phone.

## 2023-11-05 ENCOUNTER — Ambulatory Visit (HOSPITAL_COMMUNITY): Payer: Self-pay | Admitting: Vascular Surgery

## 2023-11-05 ENCOUNTER — Encounter (HOSPITAL_COMMUNITY): Payer: Self-pay | Admitting: Orthopedic Surgery

## 2023-11-05 ENCOUNTER — Ambulatory Visit (HOSPITAL_COMMUNITY)
Admission: RE | Admit: 2023-11-05 | Discharge: 2023-11-05 | Disposition: A | Discharge status: Home / Self Care | Attending: Orthopedic Surgery | Admitting: Orthopedic Surgery

## 2023-11-05 ENCOUNTER — Other Ambulatory Visit: Payer: Self-pay

## 2023-11-05 ENCOUNTER — Encounter (HOSPITAL_COMMUNITY)
Admission: RE | Disposition: A | Discharge status: Home / Self Care | Payer: Self-pay | Source: Home / Self Care | Attending: Orthopedic Surgery

## 2023-11-05 DIAGNOSIS — K219 Gastro-esophageal reflux disease without esophagitis: Secondary | ICD-10-CM | POA: Insufficient documentation

## 2023-11-05 DIAGNOSIS — I129 Hypertensive chronic kidney disease with stage 1 through stage 4 chronic kidney disease, or unspecified chronic kidney disease: Secondary | ICD-10-CM | POA: Insufficient documentation

## 2023-11-05 DIAGNOSIS — I5032 Chronic diastolic (congestive) heart failure: Secondary | ICD-10-CM | POA: Diagnosis not present

## 2023-11-05 DIAGNOSIS — E1122 Type 2 diabetes mellitus with diabetic chronic kidney disease: Secondary | ICD-10-CM | POA: Insufficient documentation

## 2023-11-05 DIAGNOSIS — N189 Chronic kidney disease, unspecified: Secondary | ICD-10-CM | POA: Insufficient documentation

## 2023-11-05 DIAGNOSIS — E66813 Obesity, class 3: Secondary | ICD-10-CM | POA: Diagnosis not present

## 2023-11-05 DIAGNOSIS — I509 Heart failure, unspecified: Secondary | ICD-10-CM | POA: Insufficient documentation

## 2023-11-05 DIAGNOSIS — G4730 Sleep apnea, unspecified: Secondary | ICD-10-CM | POA: Diagnosis not present

## 2023-11-05 DIAGNOSIS — Z6835 Body mass index (BMI) 35.0-35.9, adult: Secondary | ICD-10-CM | POA: Insufficient documentation

## 2023-11-05 DIAGNOSIS — Z794 Long term (current) use of insulin: Secondary | ICD-10-CM | POA: Diagnosis not present

## 2023-11-05 DIAGNOSIS — Z955 Presence of coronary angioplasty implant and graft: Secondary | ICD-10-CM | POA: Insufficient documentation

## 2023-11-05 DIAGNOSIS — I69351 Hemiplegia and hemiparesis following cerebral infarction affecting right dominant side: Secondary | ICD-10-CM | POA: Insufficient documentation

## 2023-11-05 DIAGNOSIS — I252 Old myocardial infarction: Secondary | ICD-10-CM | POA: Insufficient documentation

## 2023-11-05 DIAGNOSIS — I110 Hypertensive heart disease with heart failure: Secondary | ICD-10-CM | POA: Diagnosis not present

## 2023-11-05 DIAGNOSIS — M65342 Trigger finger, left ring finger: Secondary | ICD-10-CM | POA: Diagnosis present

## 2023-11-05 DIAGNOSIS — Z7984 Long term (current) use of oral hypoglycemic drugs: Secondary | ICD-10-CM | POA: Insufficient documentation

## 2023-11-05 HISTORY — PX: TRIGGER FINGER RELEASE: SHX641

## 2023-11-05 HISTORY — DX: Chronic kidney disease, unspecified: N18.9

## 2023-11-05 HISTORY — DX: Dependence on wheelchair: Z99.3

## 2023-11-05 HISTORY — DX: Sleep apnea, unspecified: G47.30

## 2023-11-05 LAB — BASIC METABOLIC PANEL WITH GFR
Anion gap: 9 (ref 5–15)
BUN: 14 mg/dL (ref 6–20)
CO2: 24 mmol/L (ref 22–32)
Calcium: 9.1 mg/dL (ref 8.9–10.3)
Chloride: 108 mmol/L (ref 98–111)
Creatinine, Ser: 1.35 mg/dL — ABNORMAL HIGH (ref 0.44–1.00)
GFR, Estimated: 47 mL/min — ABNORMAL LOW (ref >60)
Glucose, Bld: 165 mg/dL — ABNORMAL HIGH (ref 70–99)
Potassium: 4.9 mmol/L (ref 3.5–5.1)
Sodium: 141 mmol/L (ref 135–145)

## 2023-11-05 LAB — CBC
HCT: 33 % — ABNORMAL LOW (ref 36.0–46.0)
Hemoglobin: 10.7 g/dL — ABNORMAL LOW (ref 12.0–15.0)
MCH: 27.7 pg (ref 26.0–34.0)
MCHC: 32.4 g/dL (ref 30.0–36.0)
MCV: 85.5 fL (ref 80.0–100.0)
Platelets: 276 K/uL (ref 150–400)
RBC: 3.86 MIL/uL — ABNORMAL LOW (ref 3.87–5.11)
RDW: 13.8 % (ref 11.5–15.5)
WBC: 6.7 K/uL (ref 4.0–10.5)
nRBC: 0 % (ref 0.0–0.2)

## 2023-11-05 LAB — GLUCOSE, CAPILLARY
Glucose-Capillary: 152 mg/dL — ABNORMAL HIGH (ref 70–99)
Glucose-Capillary: 149 mg/dL — ABNORMAL HIGH (ref 70–99)
Glucose-Capillary: 155 mg/dL — ABNORMAL HIGH (ref 70–99)

## 2023-11-05 SURGERY — RELEASE, A1 PULLEY, FOR TRIGGER FINGER
Anesthesia: General | Site: Ring Finger | Laterality: Left

## 2023-11-05 MED ORDER — LACTATED RINGERS IV SOLN: INTRAVENOUS | Status: DC

## 2023-11-05 MED ORDER — HYDROCODONE-ACETAMINOPHEN 5-325 MG PO TABS: 1.0000 | ORAL_TABLET | Freq: Four times a day (QID) | ORAL | 0 refills | Status: DC | PRN

## 2023-11-05 MED ORDER — LIDOCAINE 2% (20 MG/ML) 5 ML SYRINGE
INTRAMUSCULAR | Status: AC
  Filled 2023-11-05: qty 5

## 2023-11-05 MED ORDER — OXYCODONE HCL 5 MG PO TABS
5.0000 mg | ORAL_TABLET | Freq: Once | ORAL | Status: AC | PRN
  Administered 2023-11-05: 5 mg via ORAL

## 2023-11-05 MED ORDER — ONDANSETRON HCL 4 MG/2ML IJ SOLN
INTRAMUSCULAR | Status: DC | PRN
  Administered 2023-11-05: 4 mg via INTRAVENOUS

## 2023-11-05 MED ORDER — OXYCODONE HCL 5 MG/5ML PO SOLN: 5.0000 mg | Freq: Once | ORAL | Status: AC | PRN

## 2023-11-05 MED ORDER — 0.9 % SODIUM CHLORIDE (POUR BTL) OPTIME
TOPICAL | Status: DC | PRN
  Administered 2023-11-05: 1000 mL

## 2023-11-05 MED ORDER — INSULIN ASPART 100 UNIT/ML IJ SOLN
0.0000 [IU] | INTRAMUSCULAR | Status: DC | PRN
  Administered 2023-11-05: 2 [IU] via SUBCUTANEOUS
  Filled 2023-11-05: qty 1

## 2023-11-05 MED ORDER — FENTANYL CITRATE (PF) 250 MCG/5ML IJ SOLN
INTRAMUSCULAR | Status: AC
  Filled 2023-11-05: qty 5

## 2023-11-05 MED ORDER — MIDAZOLAM HCL 2 MG/2ML IJ SOLN
INTRAMUSCULAR | Status: AC
  Filled 2023-11-05: qty 2

## 2023-11-05 MED ORDER — LIDOCAINE 2% (20 MG/ML) 5 ML SYRINGE
INTRAMUSCULAR | Status: DC | PRN
  Administered 2023-11-05: 100 mg via INTRAVENOUS

## 2023-11-05 MED ORDER — CEFAZOLIN SODIUM-DEXTROSE 2-4 GM/100ML-% IV SOLN
2.0000 g | INTRAVENOUS | Status: AC
Start: 2023-11-05 — End: 2023-11-05
  Administered 2023-11-05: 2 g via INTRAVENOUS
  Filled 2023-11-05: qty 100

## 2023-11-05 MED ORDER — BUPIVACAINE HCL (PF) 0.25 % IJ SOLN
INTRAMUSCULAR | Status: DC | PRN
  Administered 2023-11-05: 9 mL

## 2023-11-05 MED ORDER — DROPERIDOL 2.5 MG/ML IJ SOLN: 0.6250 mg | Freq: Once | INTRAMUSCULAR | Status: DC | PRN

## 2023-11-05 MED ORDER — FENTANYL CITRATE (PF) 100 MCG/2ML IJ SOLN: 25.0000 ug | INTRAMUSCULAR | Status: DC | PRN

## 2023-11-05 MED ORDER — ACETAMINOPHEN 10 MG/ML IV SOLN
INTRAVENOUS | Status: DC | PRN
Start: 2023-11-05 — End: 2023-11-05
  Administered 2023-11-05: 1000 mg via INTRAVENOUS

## 2023-11-05 MED ORDER — ACETAMINOPHEN 10 MG/ML IV SOLN: 1000.0000 mg | Freq: Once | INTRAVENOUS | Status: DC | PRN

## 2023-11-05 MED ORDER — ONDANSETRON HCL 4 MG/2ML IJ SOLN
INTRAMUSCULAR | Status: AC
  Filled 2023-11-05: qty 2

## 2023-11-05 MED ORDER — FENTANYL CITRATE (PF) 250 MCG/5ML IJ SOLN
INTRAMUSCULAR | Status: DC | PRN
  Administered 2023-11-05 (×2): 50 ug via INTRAVENOUS

## 2023-11-05 MED ORDER — PROPOFOL 10 MG/ML IV BOLUS
INTRAVENOUS | Status: DC | PRN
Start: 2023-11-05 — End: 2023-11-05
  Administered 2023-11-05: 180 mg via INTRAVENOUS
  Administered 2023-11-05: 20 mg via INTRAVENOUS
  Administered 2023-11-05: 50 mg via INTRAVENOUS

## 2023-11-05 MED ORDER — PROPOFOL 10 MG/ML IV BOLUS
INTRAVENOUS | Status: AC
Start: 2023-11-05 — End: 2023-11-05
  Filled 2023-11-05: qty 20

## 2023-11-05 MED ORDER — ACETAMINOPHEN 10 MG/ML IV SOLN
INTRAVENOUS | Status: AC
  Filled 2023-11-05: qty 100

## 2023-11-05 MED ORDER — PROPOFOL 10 MG/ML IV BOLUS
INTRAVENOUS | Status: AC
  Filled 2023-11-05: qty 20

## 2023-11-05 MED ORDER — DEXMEDETOMIDINE HCL IN NACL 80 MCG/20ML IV SOLN
INTRAVENOUS | Status: DC | PRN
  Administered 2023-11-05: 8 ug via INTRAVENOUS

## 2023-11-05 MED ORDER — OXYCODONE HCL 5 MG PO TABS
ORAL_TABLET | ORAL | Status: AC
  Filled 2023-11-05: qty 1

## 2023-11-05 SURGICAL SUPPLY — 25 items
BNDG COHESIVE 1X5 TAN STRL LF (GAUZE/BANDAGES/DRESSINGS) IMPLANT
BNDG COHESIVE 3X5 TAN ST LF (GAUZE/BANDAGES/DRESSINGS) ×1 IMPLANT
BNDG COMPR ESMARK 4X3 LF (GAUZE/BANDAGES/DRESSINGS) ×1 IMPLANT
CHLORAPREP W/TINT 26 (MISCELLANEOUS) ×1 IMPLANT
CORD BIPOLAR FORCEPS 12FT (ELECTRODE) IMPLANT
COVER SURGICAL LIGHT HANDLE (MISCELLANEOUS) IMPLANT
CUFF TOURN SGL QUICK 18X4 (TOURNIQUET CUFF) ×1 IMPLANT
DERMABOND ADVANCED .7 DNX12 (GAUZE/BANDAGES/DRESSINGS) IMPLANT
DRAPE SURG 17X23 STRL (DRAPES) ×1 IMPLANT
FACESHIELD WRAPAROUND OR TEAM (MASK) ×1 IMPLANT
GAUZE SPONGE 4X4 12PLY STRL (GAUZE/BANDAGES/DRESSINGS) ×1 IMPLANT
GAUZE XEROFORM 1X8 LF (GAUZE/BANDAGES/DRESSINGS) ×1 IMPLANT
GLOVE SRG 8 PF TXTR STRL LF DI (GLOVE) ×1 IMPLANT
GLOVE SURG ENC MOIS LTX SZ7.5 (GLOVE) ×1 IMPLANT
GOWN STRL REUS W/ TWL LRG LVL3 (GOWN DISPOSABLE) ×1 IMPLANT
GOWN STRL REUS W/ TWL XL LVL3 (GOWN DISPOSABLE) ×1 IMPLANT
KIT BASIN OR (CUSTOM PROCEDURE TRAY) ×1 IMPLANT
NDL HYPO 25X1 1.5 SAFETY (NEEDLE) ×1 IMPLANT
NEEDLE HYPO 25X1 1.5 SAFETY (NEEDLE) ×1 IMPLANT
PACK ORTHO EXTREMITY (CUSTOM PROCEDURE TRAY) ×1 IMPLANT
SOLN 0.9% NACL POUR BTL 1000ML (IV SOLUTION) ×1 IMPLANT
SUT ETHILON 4 0 PS 2 18 (SUTURE) ×1 IMPLANT
SYR CONTROL 10ML LL (SYRINGE) ×1 IMPLANT
TOWEL GREEN STERILE FF (TOWEL DISPOSABLE) ×1 IMPLANT
UNDERPAD 30X36 HEAVY ABSORB (UNDERPADS AND DIAPERS) ×1 IMPLANT

## 2023-11-05 NOTE — Anesthesia Procedure Notes (Signed)
 Procedure Name: LMA Insertion Date/Time: 11/05/2023 8:33 AM  Performed by: Lankford Gutzmer J, CRNAPre-anesthesia Checklist: Patient identified, Emergency Drugs available, Suction available and Patient being monitored Patient Re-evaluated:Patient Re-evaluated prior to induction Oxygen Delivery Method: Circle System Utilized Preoxygenation: Pre-oxygenation with 100% oxygen Induction Type: IV induction Ventilation: Mask ventilation without difficulty LMA: LMA inserted LMA Size: 4.0 Number of attempts: 1 Airway Equipment and Method: Bite block Placement Confirmation: positive ETCO2 Tube secured with: Tape Dental Injury: Teeth and Oropharynx as per pre-operative assessment

## 2023-11-05 NOTE — H&P (Signed)
 Tina [name] is an 53 y.o. female.   Chief Complaint: trigger digit HPI: 53 yo female with left ring finger trigger digit.  This has been injected without lasting relief.  She wishes to proceed with surgical trigger release.  Allergies:  Allergies  Allergen Reactions   Amlodipine  Benzoate Swelling and Other (See Comments)    leg swelling at 10 mg dose   Contrast Media [Iodinated Contrast Media] Shortness Of Breath and Other (See Comments)    Difficulty breathing   Iodine Anaphylaxis   Iohexol  Hives, Nausea And Vomiting, Swelling and Other (See Comments)     Desc: Magnevist-gadolinium-difficulty breathing, throat swelling    Midazolam  Hcl Anaphylaxis    Difficulty breathing   Other Shortness Of Breath, Itching and Other (See Comments)    Patient is allergic to all nuts except peanuts, which are NOT nuts- they are legumes   Shellfish Allergy Anaphylaxis   Shellfish Protein-Containing Drug Products Anaphylaxis and Other (See Comments)    Other reaction(s): Unknown   Valsartan  Swelling    angioedema   Metformin And Related Diarrhea, Nausea And Vomiting and Other (See Comments)    Dehydration, also   [place] [Dapagliflozin] Nausea Only and Other (See Comments)    UTI, headaches and muscle aches, also   Hydralazine  Other (See Comments)    Reaction not recalled   Saxagliptin Hives, Nausea And Vomiting and Other (See Comments)    ONGLYZA- dehydration, also   Spironolactone  Other (See Comments)    Elevated potassium- Severe hyperkalemia   Avandia [Rosiglitazone Maleate] Hives and Other (See Comments)   Geodon [Ziprasidone] Other (See Comments)    Reaction not recalled   Kiwi Extract Itching and Swelling   Latex Itching    Past Medical History:  Diagnosis Date   Allergy    Anemia    DURING MENSES--HAS HEAVY BLEEDING WITH PERODS   Angioedema 08/13/2020   Anxiety    Back pain, chronic    ongoing   Blood transfusion    IN 2012  AFTER C -SECTION   Cerebral thrombosis  with cerebral infarction (HCC) 06/2009   RIGHT SIDED WEAKNESS ( ARM AND LEG ) AND SPASMS-remains with slight weakness and vertigo.   CKD (chronic kidney disease)    Constipation    hx   Dependent on wheelchair    motorized w/c for long distance per pt   Depression    Diabetes mellitus    Diabetic neuropathy (HCC)    BOTH FEET --COMES AND GOES   Edema, lower extremity    Endometriosis    Fatty liver    GERD (gastroesophageal reflux disease)    with pregnancy   H/O eye surgery    Headache(784.0)    MIGRAINES--NOT REALLY HEADACHE-MORE LIKE PRESSURE SENSATION IN HEAD-FEELS DIZZIY AND  FAINT AS THE PRESSURE RESOLVES   Hernia, incisional, RLQ, s/p lap repair Sep 2013 09/02/2011   History of vertigo 03/22/2018   Hx of migraines 10/19/2011   Hyperlipidemia    Hypertension    Leg pain, right    like bad Charley horse   Multiple food allergies    Panic disorder without agoraphobia    Rash    HANDS, ARMS --STATES HX OF RASH EVER SINCE CHILDBIRTH/PREGNANCY.  STATES THE RASH OFTEN OCCURS WHEN SHE IS REALLY STRESSED.goes and comes-presently left ring finger   Restless leg syndrome    DIAGNOSED BY SLEEP STUDY - PT TOLD SHE DID NOT HAVE SLEEP APNEA   Right rotator cuff tear    PAIN IN RIGHT  SHOULDER   SBO (small bowel obstruction) (HCC) 06/03/2012   Shortness of breath    hx   Sleep apnea    does not use CPAP   Spastic hemiplegia affecting dominant side (HCC)    Stomach problems    Stroke (HCC)    Ventral hernia    RIGHT LOWER QUADRANT-CAUSING SOME PAIN   Weakness of right side of body     Past Surgical History:  Procedure Laterality Date   APPLICATION OF WOUND VAC N/A 05/25/2015   Procedure: APPLICATION OF WOUND VAC;  Surgeon: Elspeth Schultze, MD;  Location: [facility];  Service: General;  Laterality: N/A;   CARDIAC CATHETERIZATION     CESAREAN SECTION  2012   COLONOSCOPY     CORONARY STENT INTERVENTION N/A 09/05/2020   Procedure: CORONARY STENT INTERVENTION;  Surgeon:  Elmira Newman PARAS, MD;  Location: [facility];  Service: Cardiovascular;  Laterality: N/A;   CORONARY ULTRASOUND/IVUS N/A 09/05/2020   Procedure: Intravascular Ultrasound/IVUS;  Surgeon: Elmira Newman PARAS, MD;  Location: [facility];  Service: Cardiovascular;  Laterality: N/A;   DIAGNOSTIC LAPAROSCOPY     ESOPHAGOGASTRODUODENOSCOPY N/A 06/03/2012   Procedure: ESOPHAGOGASTRODUODENOSCOPY (EGD);  Surgeon: Renaye Sous, MD;  Location[facility] ENDOSCOPY;  Service: Endoscopy;  Laterality: N/A;   EXCISION MASS ABDOMINAL N/A 05/25/2015   Procedure: ABDOMINAL WALL EXPLORATION EXCISION OF SEROMA REMOVAL OF REDUNDANT SKIN ;  Surgeon: Elspeth Schultze, MD;  Location: [facility];  Service: General;  Laterality: N/A;   EYE SURGERY     Eye laser for vessel hemorrhaging   fybroid removal     HERNIA REPAIR  10/03/2011   ventral hernia repair   INJECTION, INTRAVITREAL, THERAPEUTIC AGENT, EYE Left 01/09/2023   Procedure: Injection, Intravitreal, therapeutic agent, eye;  Surgeon: Tobie Baptist, MD;  Location: [facility] OR;  Service: Ophthalmology;  Laterality: Left;   INSERTION OF MESH N/A 02/04/2013   Procedure: INSERTION OF MESH;  Surgeon: Elspeth KYM Schultze, MD;  Location: [facility];  Service: General;  Laterality: N/A;   LEFT HEART CATH AND CORONARY ANGIOGRAPHY N/A 09/05/2020   Procedure: LEFT HEART CATH AND CORONARY ANGIOGRAPHY;  Surgeon: Elmira Newman PARAS, MD;  [facility]: [facility];  Service: Cardiovascular;  Laterality: N/A;   PARS PLANA VITRECTOMY Left 08/24/2022   Procedure: PARS PLANA VITRECTOMY 25 GAUGE FOR ENDOPHTHALMITIS; INJECTION OF ANTIBIOTIC;  Surgeon: Tobie Baptist, MD;  Location: [facility] OR;  Service: Ophthalmology;  Laterality: Left;   sciatic nerve fusion  09/01/2023   at surgical center   UMBILICAL HERNIA REPAIR N/A 02/04/2013   Procedure: LAPAROSCOPIC ventral wall hernia repair LAPAROSCOPIC LYSIS OF ADHESIONS laparoscopic exploration of abdomen ;  Surgeon: Elspeth KYM Schultze, MD;   Location: [facility];  Service: General;  Laterality: N/A;   UPPER GASTROINTESTINAL ENDOSCOPY     URETER REVISION     Bilateral twisted   UTERINE FIBROID SURGERY     2 SURGERIES FOR FIBROIDS   VENTRAL HERNIA REPAIR  10/03/2011   Procedure: LAPAROSCOPIC VENTRAL HERNIA;  Surgeon: Elspeth KYM Schultze, MD;  Location: [facility];  Service: General;  Laterality: N/A;    Family History: Family History  Problem Relation Age of Onset   Diabetes Father    Kidney disease Father    Depression Father    Drug abuse Father    Allergic rhinitis Mother    Eczema Mother    Urticaria Mother    Depression Mother    Anxiety disorder Mother    Bipolar disorder Mother    Alcoholism  Mother    Drug abuse Mother    Eating disorder Mother    Diabetes Maternal Grandmother    Hyperlipidemia Paternal Grandmother    Stroke Paternal Grandmother    Eczema Sister    Urticaria Sister    Colon cancer Paternal Uncle    Other Neg Hx    Angioedema Neg Hx    Asthma Neg Hx    Colon polyps Neg Hx    Esophageal cancer Neg Hx    Rectal cancer Neg Hx    Stomach cancer Neg Hx     Social History:   reports that she has never smoked. She has never used smokeless tobacco. She reports that she does not currently use drugs. She reports that she does not drink alcohol.  Medications: Medications Prior to Admission  Medication Sig Dispense Refill   allopurinol  (ZYLOPRIM ) 100 MG tablet Take 100 mg by mouth daily.     amLODipine  (NORVASC ) 5 MG tablet Take 1 tablet (5 mg total) by mouth daily. 90 tablet 0   aspirin  EC 81 MG tablet Take 81 mg by mouth daily. Swallow whole.     atorvastatin  (LIPITOR) 40 MG tablet Take 1 tablet (40 mg total) by mouth at bedtime. 90 tablet 0   carvedilol  (COREG ) 25 MG tablet Take 25 mg by mouth 2 (two) times daily with a meal.     clopidogrel  (PLAVIX ) 75 MG tablet TAKE ONE TABLET BY MOUTH ONCE DAILY 30 tablet 0   colchicine  0.6 MG tablet Take 0.6 mg by mouth daily as needed.     Continuous Blood  Gluc Sensor (FREESTYLE LIBRE 2 SENSOR) MISC Inject 1 Device into the skin every 14 (fourteen) days.     DULoxetine  (CYMBALTA ) 20 MG capsule Take 20-40 mg by mouth See admin instructions. Take 20 mg by mouth in the morning and 40 mg at bedtime     EPINEPHrine  (AUVI-Q ) 0.3 mg/0.3 mL IJ SOAJ injection Inject 0.3 mg into the muscle as needed for anaphylaxis. 1 each 1   famotidine  (PEPCID ) 20 MG tablet Take 20 mg by mouth daily.     furosemide  (LASIX ) 40 MG tablet Take 40 mg by mouth in the morning.     gabapentin  (NEURONTIN ) 300 MG capsule Take 300 mg by mouth 2 (two) times daily.     hydrALAZINE  (APRESOLINE ) 10 MG tablet TAKE ONE TABLET BY MOUTH THREE TIMES DAILY 90 tablet 0   Insulin  Aspart FlexPen (NOVOLOG ) 100 UNIT/ML Inject 5-10 Units into the skin 3 (three) times daily as needed. Dose per sliding scale with meals     isosorbide  mononitrate (IMDUR ) 60 MG 24 hr tablet TAKE 1 TABLET BY MOUTH ONCE DAILY 30 tablet 11   spironolactone  (ALDACTONE ) 25 MG tablet Take 25 mg by mouth daily.     tirzepatide  (MOUNJARO ) 10 MG/0.5ML Pen Inject 10 mg into the skin once a week. (Patient taking differently: Inject 10 mg into the skin once a week. Takes on Thursday) 2 mL 11   tiZANidine  (ZANAFLEX ) 2 MG tablet TAKE 1 TABLET BY MOUTH AT BEDTIME 30 tablet 11   traMADol  (ULTRAM ) 50 MG tablet Take 1 tablet (50 mg total) by mouth 2 (two) times daily. 60 tablet 5   albuterol  (VENTOLIN  HFA) 108 (90 Base) MCG/ACT inhaler Inhale 2 puffs into the lungs every 4 (four) hours as needed for wheezing or shortness of breath. (Patient not taking: Reported on 11/04/2023) 18 g 1    Results for orders placed or performed during the hospital encounter of 11/05/23 (  from the past 48 hours)  Glucose, capillary     Status: Abnormal   Collection Time: 11/05/23  5:51 AM  Result Value Ref Range   Glucose-Capillary 152 (H) 70 - 99 mg/dL    Comment: Glucose reference range applies only to samples taken after fasting for at least 8 hours.    Comment 1 Notify RN   Basic metabolic panel per protocol     Status: Abnormal   Collection Time: 11/05/23  6:49 AM  Result Value Ref Range   Sodium 141 135 - 145 mmol/L   Potassium 4.9 3.5 - 5.1 mmol/L   Chloride 108 98 - 111 mmol/L   CO2 24 22 - 32 mmol/L   Glucose, Bld 165 (H) 70 - 99 mg/dL    Comment: Glucose reference range applies only to samples taken after fasting for at least 8 hours.   BUN 14 6 - 20 mg/dL   Creatinine, Ser 8.64 (H) 0.44 - 1.00 mg/dL   Calcium  9.1 8.9 - 10.3 mg/dL   GFR, Estimated 47 (L) >60 mL/min    Comment: (NOTE) Calculated using the CKD-EPI Creatinine Equation (2021)    Anion gap 9 5 - 15    Comment: Performed at [facility], [address] [place], [place] [address]  CBC per protocol     Status: Abnormal   Collection Time: 11/05/23  6:49 AM  Result Value Ref Range   WBC 6.7 4.0 - 10.5 K/uL   RBC 3.86 (L) 3.87 - 5.11 MIL/uL   Hemoglobin 10.7 (L) 12.0 - 15.0 g/dL   HCT 66.9 (L) 63.9 - 53.9 %   MCV 85.5 80.0 - 100.0 fL   MCH 27.7 26.0 - 34.0 pg   MCHC 32.4 30.0 - 36.0 g/dL   RDW 86.1 88.4 - 84.4 %   Platelets 276 150 - 400 K/uL   nRBC 0.0 0.0 - 0.2 %    Comment: Performed at [facility], [address], [place], [place] [address]  Glucose, capillary     Status: Abnormal   Collection Time: 11/05/23  7:55 AM  Result Value Ref Range   Glucose-Capillary 149 (H) 70 - 99 mg/dL    Comment: Glucose reference range applies only to samples taken after fasting for at least 8 hours.   Comment 1 Notify RN    *Note: Due to a large number of results and/or encounters for the requested time period, some results have not been displayed. A complete set of results can be found in Results Review.    No results found.    Blood pressure (!) 145/75, pulse 71, temperature 97.8 F (36.6 C), resp. rate 18, height 5' 6 (1.676 m), weight 99.3 kg, SpO2 100%.  General appearance: alert, cooperative, and appears stated age Head: Normocephalic, without  obvious abnormality, atraumatic Neck: supple, symmetrical, trachea midline Extremities: Intact sensation and capillary refill all digits.  +epl/fpl/io.  No wounds.  Skin: Skin color, texture, turgor normal. No rashes or lesions Neurologic: Grossly normal Incision/Wound: none  Assessment/Plan Left ring finger trigger digit.  Non operative and operative treatment options have been discussed with the patient and patient wishes to proceed with operative treatment. Risks, benefits, and alternatives of surgery have been discussed and the patient agrees with the plan of care.   Efrem Pitstick 11/05/2023, 8:09 AM

## 2023-11-05 NOTE — Op Note (Signed)
 11/05/2023 [facility] OR  Operative Note  PREOPERATIVE DIAGNOSIS: LEFT RING FINGER TRIGGER DIGIT  POSTOPERATIVE DIAGNOSIS:  LEFT RING FINGER TRIGGER DIGIT  PROCEDURE: Procedure(s): RELEASE, A1 PULLEY, FOR LEFT TRIGGER FINGER   SURGEON:  Franky Curia, MD  ASSISTANT:  none.  ANESTHESIA:  General.  IV FLUIDS:  Per anesthesia flow sheet.  ESTIMATED BLOOD LOSS:  Minimal.  COMPLICATIONS:  None.  SPECIMENS:  None.  TOURNIQUET TIME:  Total Tourniquet Time Documented: Forearm (Left) - 19 minutes Total: Forearm (Left) - 19 minutes   DISPOSITION:  Stable to PACU.  LOCATION: [facility] OR  INDICATIONS: Tina [name] is a 53 y.o. female with triggering of the ring finger.  This has been injected without lasting resolution.  She wishes to proceed with surgical trigger release.  Risks, benefits and alternatives of surgery were discussed including the risk of blood loss, infection, damage to nerves, vessels, tendons, ligaments, bone, failure of surgery, need for additional surgery, complications with wound healing, continued pain, continued triggering and need for repeat surgery.  She voiced understanding of these risks and elected to proceed.  OPERATIVE COURSE:  After being identified preoperatively by myself, the patient and I agreed upon the procedure and site of procedure.  The surgical site was marked. Surgical consent had been signed. She was given IV Ancef  as preoperative antibiotic prophylaxis. She was transported to the operating room and placed on the operating room table in supine position with the Left upper extremity on an arm board. General anesthesia was induced by the anesthesiologist.  The Left upper extremity was prepped and draped in normal sterile orthopedic fashion. A surgical pause was performed between surgeons, anesthesia, and operating room staff, and all were in agreement as to the patient, procedure, and site of procedure.  Tourniquet at the proximal aspect of the extremity was  inflated to 250 mmHg after exsanguination of the arm with an Esmarch bandage.  An incision was made at the volar aspect of the MP joint of the ring finger.  When making the incision she clenched her fist and in doing so sustained a laceration to the dorsal ulnar aspect of the long finger at the level of the DIP joint.  This went into the dermis with only a small amount of subcutaneous fat visible at proximately 1 mm of space.  There was no injury to deeper structures.  The incision was carried into the subcutaneous tissues by spreading technique.  The radial and ulnar digital nerves were protected throughout the case. The flexor sheath was identified.  The A1 pulley was identified and sharply incised.  It was released in its entirety.  The proximal 1-2 mm of the A2 pulley was vented to allow better excursion of the tendons.  The finger was placed through a range of motion and there was noted to be no catching.  The tendons were brought through the wound and any adherences released.  The wound was then copiously irrigated with sterile saline. It was closed with 4-0 nylon in a horizontal mattress fashion.  It was injected with 0.25% plain Marcaine  to aid in postoperative analgesia.  The laceration to the long finger had been irrigated.  It was reapproximated and closed with Dermabond.  A Band-Aid was placed over top.  The surgical wound was dressed with sterile Xeroform, 4x4s, and wrapped lightly with a Coban dressing.  Tourniquet was deflated at 19 minutes.  The fingertips were pink with brisk capillary refill after deflation of the tourniquet.  The operative drapes were broken  down and the patient was awoken from anesthesia safely.  She was transferred back to the stretcher and taken to the PACU in stable condition.   I will see her back in the office in 1 week for postoperative followup.  I will give her a prescription for Norco 5/325 1 tab PO q6 hours prn pain, dispense #15.    Dantae Meunier, MD Electronically  signed, 11/05/23

## 2023-11-05 NOTE — Anesthesia Postprocedure Evaluation (Signed)
 Anesthesia Post Note  Patient: Tina [name]  Procedure(s) Performed: RELEASE, A1 PULLEY, FOR LEFT TRIGGER FINGER (Left: Ring Finger)     Patient location during evaluation: PACU Anesthesia Type: General Level of consciousness: awake and alert Pain management: pain level controlled Vital Signs Assessment: post-procedure vital signs reviewed and stable Respiratory status: spontaneous breathing, nonlabored ventilation, respiratory function stable and patient connected to nasal cannula oxygen Cardiovascular status: blood pressure returned to baseline and stable Postop Assessment: no apparent nausea or vomiting Anesthetic complications: no   No notable events documented.  Last Vitals:  Vitals:   11/05/23 0930 11/05/23 0945  BP: (!) 150/71 (!) 153/68  Pulse: 80 75  Resp: 11 18  Temp:  36.5 C  SpO2: 94% 97%    Last Pain:  Vitals:   11/05/23 0942  PainSc: 5                  Rome Ade

## 2023-11-05 NOTE — Discharge Instructions (Signed)

## 2023-11-05 NOTE — Transfer of Care (Signed)
 Immediate Anesthesia Transfer of Care Note  Patient: Tina [name]  Procedure(s) Performed: RELEASE, A1 PULLEY, FOR LEFT TRIGGER FINGER (Left: Ring Finger)  Patient Location: PACU  Anesthesia Type:General  Level of Consciousness: awake, alert , and oriented  Airway & Oxygen Therapy: Patient Spontanous Breathing and Patient connected to face mask oxygen  Post-op Assessment: Report given to RN and Post -op Vital signs reviewed and stable  Post vital signs: Reviewed and stable  Last Vitals:  Vitals Value Taken Time  BP 147/81 11/05/23 09:16  Temp    Pulse 78 11/05/23 09:19  Resp 17 11/05/23 09:20  SpO2 90 % 11/05/23 09:19  Vitals shown include unfiled device data.  Last Pain:  Vitals:   11/05/23 0649  PainSc: 5       Patients Stated Pain Goal: 1 (11/05/23 9350)  Complications: No notable events documented.

## 2023-11-06 ENCOUNTER — Encounter (HOSPITAL_COMMUNITY): Payer: Self-pay | Admitting: Orthopedic Surgery

## 2023-11-10 ENCOUNTER — Telehealth: Payer: Self-pay | Admitting: *Deleted

## 2023-11-10 ENCOUNTER — Other Ambulatory Visit: Payer: Self-pay | Admitting: Cardiology

## 2023-11-10 NOTE — Telephone Encounter (Signed)
 Phone rep called and offered next available appointment with wait list, pt also told to bring CPAP and power cord.

## 2023-11-10 NOTE — Telephone Encounter (Signed)
 Received a fax from [facility] that originated from [employer]. It is an order for cpap supply renewal. We have not seen patient since March 2024. She will need to be seen first before we can further send in refills. Please call patient and schedule her with Amy NP or Dr Chalice, next available. 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

## 2023-11-10 NOTE — Telephone Encounter (Signed)
 Thank you :)

## 2023-11-11 DIAGNOSIS — E113511 Type 2 diabetes mellitus with proliferative diabetic retinopathy with macular edema, right eye: Secondary | ICD-10-CM | POA: Diagnosis not present

## 2023-11-12 DIAGNOSIS — M65342 Trigger finger, left ring finger: Secondary | ICD-10-CM | POA: Diagnosis not present

## 2023-11-16 ENCOUNTER — Other Ambulatory Visit (HOSPITAL_COMMUNITY): Payer: Self-pay

## 2023-11-16 DIAGNOSIS — Z9889 Other specified postprocedural states: Secondary | ICD-10-CM | POA: Diagnosis not present

## 2023-11-23 DIAGNOSIS — E1165 Type 2 diabetes mellitus with hyperglycemia: Secondary | ICD-10-CM | POA: Diagnosis not present

## 2023-11-23 DIAGNOSIS — E785 Hyperlipidemia, unspecified: Secondary | ICD-10-CM | POA: Diagnosis not present

## 2023-11-23 DIAGNOSIS — Z794 Long term (current) use of insulin: Secondary | ICD-10-CM | POA: Diagnosis not present

## 2023-11-23 DIAGNOSIS — N1832 Chronic kidney disease, stage 3b: Secondary | ICD-10-CM | POA: Diagnosis not present

## 2023-11-27 DIAGNOSIS — G4733 Obstructive sleep apnea (adult) (pediatric): Secondary | ICD-10-CM | POA: Diagnosis not present

## 2023-12-04 DIAGNOSIS — E1165 Type 2 diabetes mellitus with hyperglycemia: Secondary | ICD-10-CM | POA: Diagnosis not present

## 2023-12-04 DIAGNOSIS — E1122 Type 2 diabetes mellitus with diabetic chronic kidney disease: Secondary | ICD-10-CM | POA: Diagnosis not present

## 2023-12-04 DIAGNOSIS — E1169 Type 2 diabetes mellitus with other specified complication: Secondary | ICD-10-CM | POA: Diagnosis not present

## 2023-12-09 ENCOUNTER — Other Ambulatory Visit: Payer: Self-pay | Admitting: Physical Medicine & Rehabilitation

## 2023-12-14 DIAGNOSIS — E113512 Type 2 diabetes mellitus with proliferative diabetic retinopathy with macular edema, left eye: Secondary | ICD-10-CM | POA: Diagnosis not present

## 2023-12-16 ENCOUNTER — Other Ambulatory Visit: Payer: Self-pay

## 2023-12-18 ENCOUNTER — Encounter: Admitting: Physical Medicine & Rehabilitation

## 2023-12-26 ENCOUNTER — Inpatient Hospital Stay (HOSPITAL_COMMUNITY)
Admission: EM | Admit: 2023-12-26 | Discharge: 2023-12-28 | DRG: 683 | Disposition: A | Attending: Hospitalist | Admitting: Hospitalist

## 2023-12-26 DIAGNOSIS — I5032 Chronic diastolic (congestive) heart failure: Secondary | ICD-10-CM | POA: Diagnosis not present

## 2023-12-26 DIAGNOSIS — Z8419 Family history of other disorders of kidney and ureter: Secondary | ICD-10-CM | POA: Diagnosis not present

## 2023-12-26 DIAGNOSIS — K219 Gastro-esophageal reflux disease without esophagitis: Secondary | ICD-10-CM | POA: Diagnosis not present

## 2023-12-26 DIAGNOSIS — N179 Acute kidney failure, unspecified: Secondary | ICD-10-CM | POA: Diagnosis not present

## 2023-12-26 DIAGNOSIS — Z7985 Long-term (current) use of injectable non-insulin antidiabetic drugs: Secondary | ICD-10-CM | POA: Diagnosis not present

## 2023-12-26 DIAGNOSIS — I10 Essential (primary) hypertension: Secondary | ICD-10-CM | POA: Diagnosis not present

## 2023-12-26 DIAGNOSIS — Z794 Long term (current) use of insulin: Secondary | ICD-10-CM | POA: Diagnosis not present

## 2023-12-26 DIAGNOSIS — Z8673 Personal history of transient ischemic attack (TIA), and cerebral infarction without residual deficits: Secondary | ICD-10-CM | POA: Diagnosis not present

## 2023-12-26 DIAGNOSIS — I110 Hypertensive heart disease with heart failure: Secondary | ICD-10-CM | POA: Diagnosis not present

## 2023-12-26 DIAGNOSIS — E785 Hyperlipidemia, unspecified: Secondary | ICD-10-CM | POA: Diagnosis not present

## 2023-12-26 DIAGNOSIS — E1140 Type 2 diabetes mellitus with diabetic neuropathy, unspecified: Secondary | ICD-10-CM | POA: Diagnosis not present

## 2023-12-26 DIAGNOSIS — Z833 Family history of diabetes mellitus: Secondary | ICD-10-CM | POA: Diagnosis not present

## 2023-12-26 DIAGNOSIS — Z6835 Body mass index (BMI) 35.0-35.9, adult: Secondary | ICD-10-CM | POA: Diagnosis not present

## 2023-12-26 DIAGNOSIS — I252 Old myocardial infarction: Secondary | ICD-10-CM | POA: Diagnosis not present

## 2023-12-26 DIAGNOSIS — Z9861 Coronary angioplasty status: Secondary | ICD-10-CM | POA: Diagnosis not present

## 2023-12-26 DIAGNOSIS — E860 Dehydration: Secondary | ICD-10-CM | POA: Diagnosis not present

## 2023-12-26 DIAGNOSIS — Z7902 Long term (current) use of antithrombotics/antiplatelets: Secondary | ICD-10-CM | POA: Diagnosis not present

## 2023-12-26 DIAGNOSIS — Z823 Family history of stroke: Secondary | ICD-10-CM | POA: Diagnosis not present

## 2023-12-26 DIAGNOSIS — K760 Fatty (change of) liver, not elsewhere classified: Secondary | ICD-10-CM | POA: Diagnosis not present

## 2023-12-26 DIAGNOSIS — Z79899 Other long term (current) drug therapy: Secondary | ICD-10-CM | POA: Diagnosis not present

## 2023-12-26 DIAGNOSIS — Z993 Dependence on wheelchair: Secondary | ICD-10-CM | POA: Diagnosis not present

## 2023-12-26 DIAGNOSIS — I2510 Atherosclerotic heart disease of native coronary artery without angina pectoris: Secondary | ICD-10-CM | POA: Diagnosis not present

## 2023-12-26 DIAGNOSIS — G2581 Restless legs syndrome: Secondary | ICD-10-CM | POA: Diagnosis not present

## 2023-12-26 DIAGNOSIS — E669 Obesity, unspecified: Secondary | ICD-10-CM | POA: Diagnosis not present

## 2023-12-26 DIAGNOSIS — Z7982 Long term (current) use of aspirin: Secondary | ICD-10-CM | POA: Diagnosis not present

## 2023-12-27 ENCOUNTER — Emergency Department (HOSPITAL_COMMUNITY)

## 2023-12-27 ENCOUNTER — Inpatient Hospital Stay (HOSPITAL_COMMUNITY)

## 2023-12-27 ENCOUNTER — Other Ambulatory Visit: Payer: Self-pay

## 2023-12-27 ENCOUNTER — Encounter (HOSPITAL_COMMUNITY): Payer: Self-pay | Admitting: *Deleted

## 2023-12-27 DIAGNOSIS — I5032 Chronic diastolic (congestive) heart failure: Secondary | ICD-10-CM | POA: Diagnosis present

## 2023-12-27 DIAGNOSIS — G2581 Restless legs syndrome: Secondary | ICD-10-CM | POA: Diagnosis present

## 2023-12-27 DIAGNOSIS — I110 Hypertensive heart disease with heart failure: Secondary | ICD-10-CM | POA: Diagnosis present

## 2023-12-27 DIAGNOSIS — Z993 Dependence on wheelchair: Secondary | ICD-10-CM | POA: Diagnosis not present

## 2023-12-27 DIAGNOSIS — K219 Gastro-esophageal reflux disease without esophagitis: Secondary | ICD-10-CM | POA: Diagnosis present

## 2023-12-27 DIAGNOSIS — G8929 Other chronic pain: Secondary | ICD-10-CM | POA: Diagnosis not present

## 2023-12-27 DIAGNOSIS — E860 Dehydration: Secondary | ICD-10-CM | POA: Diagnosis present

## 2023-12-27 DIAGNOSIS — E785 Hyperlipidemia, unspecified: Secondary | ICD-10-CM | POA: Diagnosis present

## 2023-12-27 DIAGNOSIS — Z7902 Long term (current) use of antithrombotics/antiplatelets: Secondary | ICD-10-CM | POA: Diagnosis not present

## 2023-12-27 DIAGNOSIS — Z7985 Long-term (current) use of injectable non-insulin antidiabetic drugs: Secondary | ICD-10-CM | POA: Diagnosis not present

## 2023-12-27 DIAGNOSIS — Z743 Need for continuous supervision: Secondary | ICD-10-CM | POA: Diagnosis not present

## 2023-12-27 DIAGNOSIS — E1140 Type 2 diabetes mellitus with diabetic neuropathy, unspecified: Secondary | ICD-10-CM | POA: Diagnosis present

## 2023-12-27 DIAGNOSIS — Z7982 Long term (current) use of aspirin: Secondary | ICD-10-CM | POA: Diagnosis not present

## 2023-12-27 DIAGNOSIS — Z9861 Coronary angioplasty status: Secondary | ICD-10-CM | POA: Diagnosis not present

## 2023-12-27 DIAGNOSIS — I252 Old myocardial infarction: Secondary | ICD-10-CM | POA: Diagnosis not present

## 2023-12-27 DIAGNOSIS — M25512 Pain in left shoulder: Secondary | ICD-10-CM | POA: Diagnosis not present

## 2023-12-27 DIAGNOSIS — R079 Chest pain, unspecified: Secondary | ICD-10-CM | POA: Diagnosis not present

## 2023-12-27 DIAGNOSIS — E669 Obesity, unspecified: Secondary | ICD-10-CM | POA: Diagnosis present

## 2023-12-27 DIAGNOSIS — Z6835 Body mass index (BMI) 35.0-35.9, adult: Secondary | ICD-10-CM | POA: Diagnosis not present

## 2023-12-27 DIAGNOSIS — I2510 Atherosclerotic heart disease of native coronary artery without angina pectoris: Secondary | ICD-10-CM | POA: Diagnosis present

## 2023-12-27 DIAGNOSIS — Z8673 Personal history of transient ischemic attack (TIA), and cerebral infarction without residual deficits: Secondary | ICD-10-CM | POA: Diagnosis not present

## 2023-12-27 DIAGNOSIS — Z794 Long term (current) use of insulin: Secondary | ICD-10-CM | POA: Diagnosis not present

## 2023-12-27 DIAGNOSIS — Z833 Family history of diabetes mellitus: Secondary | ICD-10-CM | POA: Diagnosis not present

## 2023-12-27 DIAGNOSIS — N179 Acute kidney failure, unspecified: Secondary | ICD-10-CM | POA: Diagnosis present

## 2023-12-27 DIAGNOSIS — Z79899 Other long term (current) drug therapy: Secondary | ICD-10-CM | POA: Diagnosis not present

## 2023-12-27 DIAGNOSIS — M25519 Pain in unspecified shoulder: Secondary | ICD-10-CM | POA: Diagnosis not present

## 2023-12-27 DIAGNOSIS — Z8419 Family history of other disorders of kidney and ureter: Secondary | ICD-10-CM | POA: Diagnosis not present

## 2023-12-27 DIAGNOSIS — K760 Fatty (change of) liver, not elsewhere classified: Secondary | ICD-10-CM | POA: Diagnosis present

## 2023-12-27 DIAGNOSIS — G4733 Obstructive sleep apnea (adult) (pediatric): Secondary | ICD-10-CM | POA: Diagnosis not present

## 2023-12-27 DIAGNOSIS — Z823 Family history of stroke: Secondary | ICD-10-CM | POA: Diagnosis not present

## 2023-12-27 DIAGNOSIS — M19012 Primary osteoarthritis, left shoulder: Secondary | ICD-10-CM | POA: Diagnosis not present

## 2023-12-27 LAB — BRAIN NATRIURETIC PEPTIDE: B Natriuretic Peptide: 4.3 pg/mL (ref 0.0–100.0)

## 2023-12-27 LAB — BASIC METABOLIC PANEL WITH GFR
Anion gap: 10 (ref 5–15)
Anion gap: 11 (ref 5–15)
BUN: 34 mg/dL — ABNORMAL HIGH (ref 6–20)
BUN: 38 mg/dL — ABNORMAL HIGH (ref 6–20)
CO2: 23 mmol/L (ref 22–32)
CO2: 26 mmol/L (ref 22–32)
Calcium: 9.1 mg/dL (ref 8.9–10.3)
Calcium: 9.1 mg/dL (ref 8.9–10.3)
Chloride: 101 mmol/L (ref 98–111)
Chloride: 104 mmol/L (ref 98–111)
Creatinine, Ser: 1.6 mg/dL — ABNORMAL HIGH (ref 0.44–1.00)
Creatinine, Ser: 2.63 mg/dL — ABNORMAL HIGH (ref 0.44–1.00)
GFR, Estimated: 21 mL/min — ABNORMAL LOW (ref >60)
GFR, Estimated: 38 mL/min — ABNORMAL LOW (ref >60)
Glucose, Bld: 223 mg/dL — ABNORMAL HIGH (ref 70–99)
Glucose, Bld: 285 mg/dL — ABNORMAL HIGH (ref 70–99)
Potassium: 4.6 mmol/L (ref 3.5–5.1)
Potassium: 4.6 mmol/L (ref 3.5–5.1)
Sodium: 137 mmol/L (ref 135–145)
Sodium: 138 mmol/L (ref 135–145)

## 2023-12-27 LAB — CBC
HCT: 35.9 % — ABNORMAL LOW (ref 36.0–46.0)
Hemoglobin: 11.5 g/dL — ABNORMAL LOW (ref 12.0–15.0)
MCH: 26.9 pg (ref 26.0–34.0)
MCHC: 32 g/dL (ref 30.0–36.0)
MCV: 84.1 fL (ref 80.0–100.0)
Platelets: 296 K/uL (ref 150–400)
RBC: 4.27 MIL/uL (ref 3.87–5.11)
RDW: 13.3 % (ref 11.5–15.5)
WBC: 7 K/uL (ref 4.0–10.5)
nRBC: 0 % (ref 0.0–0.2)

## 2023-12-27 LAB — TROPONIN I (HIGH SENSITIVITY)
Troponin I (High Sensitivity): 4 ng/L (ref <18)
Troponin I (High Sensitivity): 4 ng/L (ref <18)

## 2023-12-27 LAB — URINALYSIS, ROUTINE W REFLEX MICROSCOPIC
Bilirubin Urine: NEGATIVE
Glucose, UA: 50 mg/dL — AB
Hgb urine dipstick: NEGATIVE
Ketones, ur: NEGATIVE mg/dL
Leukocytes,Ua: NEGATIVE
Nitrite: NEGATIVE
Protein, ur: 100 mg/dL — AB
Specific Gravity, Urine: 1.013 (ref 1.005–1.030)
pH: 5 (ref 5.0–8.0)

## 2023-12-27 LAB — GLUCOSE, CAPILLARY
Glucose-Capillary: 192 mg/dL — ABNORMAL HIGH (ref 70–99)
Glucose-Capillary: 253 mg/dL — ABNORMAL HIGH (ref 70–99)

## 2023-12-27 LAB — HEMOGLOBIN A1C
Hgb A1c MFr Bld: 8.2 % — ABNORMAL HIGH (ref 4.8–5.6)
Mean Plasma Glucose: 188.64 mg/dL

## 2023-12-27 LAB — CREATININE, SERUM
Creatinine, Ser: 2.74 mg/dL — ABNORMAL HIGH (ref 0.44–1.00)
GFR, Estimated: 20 mL/min — ABNORMAL LOW (ref >60)

## 2023-12-27 LAB — HIV ANTIBODY (ROUTINE TESTING W REFLEX): HIV Screen 4th Generation wRfx: NONREACTIVE

## 2023-12-27 MED ORDER — LACTATED RINGERS IV SOLN: INTRAVENOUS | Status: AC

## 2023-12-27 MED ORDER — TIZANIDINE HCL 4 MG PO TABS
2.0000 mg | ORAL_TABLET | Freq: Once | ORAL | Status: AC
  Administered 2023-12-28: 01:00:00 2 mg via ORAL
  Filled 2023-12-27: qty 1

## 2023-12-27 MED ORDER — ENOXAPARIN SODIUM 30 MG/0.3ML IJ SOSY
30.0000 mg | PREFILLED_SYRINGE | INTRAMUSCULAR | Status: DC
  Administered 2023-12-27: 30 mg via SUBCUTANEOUS
  Filled 2023-12-27: qty 0.3

## 2023-12-27 MED ORDER — ASPIRIN 81 MG PO TBEC
81.0000 mg | DELAYED_RELEASE_TABLET | Freq: Every day | ORAL | Status: DC
  Administered 2023-12-27 – 2023-12-28 (×2): 81 mg via ORAL
  Filled 2023-12-27 (×2): qty 1

## 2023-12-27 MED ORDER — CLOPIDOGREL BISULFATE 75 MG PO TABS
75.0000 mg | ORAL_TABLET | Freq: Every day | ORAL | Status: DC
  Administered 2023-12-27 – 2023-12-28 (×2): 75 mg via ORAL
  Filled 2023-12-27 (×2): qty 1

## 2023-12-27 MED ORDER — LACTATED RINGERS IV BOLUS
1000.0000 mL | Freq: Once | INTRAVENOUS | Status: AC
  Administered 2023-12-27: 1000 mL via INTRAVENOUS

## 2023-12-27 MED ORDER — ATORVASTATIN CALCIUM 40 MG PO TABS
40.0000 mg | ORAL_TABLET | Freq: Every day | ORAL | Status: DC
  Administered 2023-12-27: 40 mg via ORAL
  Filled 2023-12-27: qty 1

## 2023-12-27 MED ORDER — SODIUM CHLORIDE 0.9 % IV SOLN: INTRAVENOUS | Status: DC

## 2023-12-27 MED ORDER — TRAMADOL HCL 50 MG PO TABS
50.0000 mg | ORAL_TABLET | Freq: Two times a day (BID) | ORAL | Status: DC
  Administered 2023-12-27 – 2023-12-28 (×3): 50 mg via ORAL
  Filled 2023-12-27 (×3): qty 1

## 2023-12-27 MED ORDER — ORAL CARE MOUTH RINSE: 15.0000 mL | OROMUCOSAL | Status: DC | PRN

## 2023-12-27 MED ORDER — INSULIN ASPART 100 UNIT/ML IJ SOLN
3.0000 [IU] | Freq: Once | INTRAMUSCULAR | Status: AC
  Administered 2023-12-27: 3 [IU] via SUBCUTANEOUS
  Filled 2023-12-27: qty 3

## 2023-12-27 MED ORDER — FAMOTIDINE 20 MG PO TABS: 20.0000 mg | ORAL_TABLET | ORAL | Status: DC | PRN

## 2023-12-27 MED ORDER — AMLODIPINE BESYLATE 5 MG PO TABS
5.0000 mg | ORAL_TABLET | Freq: Every day | ORAL | Status: DC
  Administered 2023-12-27 – 2023-12-28 (×2): 5 mg via ORAL
  Filled 2023-12-27 (×2): qty 1

## 2023-12-27 MED ORDER — CARVEDILOL 25 MG PO TABS
25.0000 mg | ORAL_TABLET | Freq: Two times a day (BID) | ORAL | Status: DC
  Administered 2023-12-27 – 2023-12-28 (×3): 25 mg via ORAL
  Filled 2023-12-27 (×2): qty 1
  Filled 2023-12-27: qty 2

## 2023-12-27 MED ORDER — INSULIN ASPART 100 UNIT/ML IJ SOLN: 0.0000 [IU] | Freq: Three times a day (TID) | INTRAMUSCULAR | Status: DC

## 2023-12-27 MED ORDER — INSULIN ASPART 100 UNIT/ML IJ SOLN
0.0000 [IU] | Freq: Three times a day (TID) | INTRAMUSCULAR | Status: DC
  Administered 2023-12-27: 3 [IU] via SUBCUTANEOUS
  Administered 2023-12-28: 08:00:00 5 [IU] via SUBCUTANEOUS
  Filled 2023-12-27: qty 3
  Filled 2023-12-27: qty 5

## 2023-12-27 MED ORDER — GABAPENTIN 300 MG PO CAPS
300.0000 mg | ORAL_CAPSULE | Freq: Two times a day (BID) | ORAL | Status: DC
  Administered 2023-12-27 – 2023-12-28 (×3): 300 mg via ORAL
  Filled 2023-12-27 (×3): qty 1

## 2023-12-27 MED ORDER — ISOSORBIDE MONONITRATE ER 60 MG PO TB24
60.0000 mg | ORAL_TABLET | Freq: Every day | ORAL | Status: DC
  Administered 2023-12-27 – 2023-12-28 (×2): 60 mg via ORAL
  Filled 2023-12-27 (×2): qty 1

## 2023-12-27 NOTE — ED Provider Notes (Signed)
 [place] EMERGENCY DEPARTMENT AT [facility] Provider Note   CSN: [id number] Arrival date & time: 12/26/23  2332     Patient presents with: Shoulder Pain   Tina [name] is a 53 y.o. female.   HPI 53 year old female presents with a chief complaint of left shoulder pain.  Patient has had atraumatic left shoulder pain for the past couple days.  She is concerned because a few years ago she had right sided shoulder pain that ended up being from an MI.  There is no worsening pain with exertion.  She denies any chest pain, shortness of breath.  Does have some tingling in her left hand but no weakness.  She has not had a fever or noticed any swelling or redness to the shoulder.  She has not had any dysuria (first exam note indicates dysuria but she denies this).  She denies any hematuria or abdominal pain.  Has chronic but no new back pain.  She did have some leg swelling which she normally takes furosemide  for and was encouraged to take an extra furosemide  last week but feels like the swelling is better.  She has not been taking any NSAIDs.  Prior to Admission medications  Medication Sig Start Date End Date Taking? Authorizing Provider  albuterol  (VENTOLIN  HFA) 108 (90 Base) MCG/ACT inhaler Inhale 2 puffs into the lungs every 4 (four) hours as needed for wheezing or shortness of breath. Patient not taking: Reported on 11/04/2023 08/16/20   Cari Arlean CHRISTELLA, FNP  allopurinol  (ZYLOPRIM ) 100 MG tablet Take 100 mg by mouth daily.    [provider]  amLODipine  (NORVASC ) 5 MG tablet Take 1 tablet (5 mg total) by mouth daily. 09/06/20 11/05/23  Tolia, Sunit, DO  aspirin  EC 81 MG tablet Take 81 mg by mouth daily. Swallow whole.    [provider]  atorvastatin  (LIPITOR) 40 MG tablet Take 1 tablet (40 mg total) by mouth at bedtime. 09/06/20 11/04/23  Tolia, Sunit, DO  carvedilol  (COREG ) 25 MG tablet Take 25 mg by mouth 2 (two) times daily with a meal.    [provider]  clopidogrel  (PLAVIX ) 75 MG tablet TAKE ONE TABLET BY MOUTH ONCE DAILY 04/10/22   Tolia, Sunit, DO  colchicine  0.6 MG tablet Take 0.6 mg by mouth daily as needed.    [provider]  Continuous Blood Gluc Sensor (FREESTYLE LIBRE 2 SENSOR) MISC Inject 1 Device into the skin every 14 (fourteen) days. 08/23/19   [provider]  DULoxetine  (CYMBALTA ) 20 MG capsule Take 20-40 mg by mouth See admin instructions. Take 20 mg by mouth in the morning and 40 mg at bedtime    [provider]  EPINEPHrine  (AUVI-Q ) 0.3 mg/0.3 mL IJ SOAJ injection Inject 0.3 mg into the muscle as needed for anaphylaxis. 08/16/20   Cari Arlean CHRISTELLA, FNP  famotidine  (PEPCID ) 20 MG tablet Take 20 mg by mouth daily.    [provider]  furosemide  (LASIX ) 40 MG tablet Take 40 mg by mouth in the morning.    [provider]  gabapentin  (NEURONTIN ) 300 MG capsule Take 300 mg by mouth 2 (two) times daily. 12/14/20   [provider]  hydrALAZINE  (APRESOLINE ) 10 MG tablet TAKE ONE TABLET BY MOUTH THREE TIMES DAILY 04/10/22   Tolia, Sunit, DO  HYDROcodone -acetaminophen  (NORCO/VICODIN) 5-325 MG tablet Take 1 tablet by mouth every 6 (six) hours as needed for moderate pain (pain score 4-6). 11/05/23   Murrell Drivers, MD  Insulin  Aspart FlexPen (  NOVOLOG ) 100 UNIT/ML Inject 5-10 Units into the skin 3 (three) times daily as needed. Dose per sliding scale with meals 09/20/20   [provider]  isosorbide  mononitrate (IMDUR ) 60 MG 24 hr tablet TAKE 1 TABLET BY MOUTH ONCE DAILY 11/11/23   Tolia, Sunit, DO  spironolactone  (ALDACTONE ) 25 MG tablet Take 25 mg by mouth daily. 06/24/21   [provider]  tirzepatide  (MOUNJARO ) 10 MG/0.5ML Pen Inject 10 mg into the skin once a week. Patient taking differently: Inject 10 mg into the skin once a week. Takes on Thursday 09/22/23   Tolia, Sunit, DO  tiZANidine  (ZANAFLEX ) 2 MG tablet TAKE 1 TABLET BY MOUTH AT BEDTIME 10/13/23   Kirsteins, Prentice BRAVO, MD   traMADol  (ULTRAM ) 50 MG tablet Take 1 tablet (50 mg total) by mouth 2 (two) times daily. 12/15/23   Kirsteins, Prentice BRAVO, MD  diphenhydrAMINE  (BENADRYL ) 25 MG tablet Take 1 tablet (25 mg total) by mouth every 8 (eight) hours as needed for up to 2 days for itching. 08/14/20 08/15/20  Fairy Frames, MD    Allergies: Amlodipine  benzoate, Contrast media [iodinated contrast media], Iodine, Iohexol , Midazolam  hcl, Other, Shellfish allergy, Shellfish protein-containing drug products, Valsartan , Metformin and related, Farxiga [dapagliflozin], Hydralazine , Saxagliptin, Spironolactone , Avandia [rosiglitazone maleate], Geodon [ziprasidone], Kiwi extract, and Latex    Review of Systems  Respiratory:  Negative for shortness of breath.   Cardiovascular:  Negative for chest pain.  Gastrointestinal:  Negative for abdominal pain, diarrhea and vomiting.  Genitourinary:  Negative for dysuria, flank pain and hematuria.  Musculoskeletal:  Positive for arthralgias and back pain (chronic).  Neurological:  Negative for weakness.    Updated Vital Signs BP 116/75 (BP Location: Right Arm)   Pulse 73   Temp (!) 97.4 F (36.3 C)   Resp 18   Ht 5' 6 (1.676 m)   Wt 99.3 kg   LMP  (LMP Unknown)   SpO2 100%   BMI 35.33 kg/m   Physical Exam Vitals and nursing note reviewed.  Constitutional:      Appearance: She is well-developed.  HENT:     Head: Normocephalic and atraumatic.  Cardiovascular:     Rate and Rhythm: Normal rate and regular rhythm.     Pulses:          Radial pulses are 2+ on the left side.     Heart sounds: Normal heart sounds.  Pulmonary:     Effort: Pulmonary effort is normal.     Breath sounds: Normal breath sounds.  Abdominal:     Palpations: Abdomen is soft.     Tenderness: There is no abdominal tenderness.  Musculoskeletal:     Left shoulder: Tenderness present. No swelling.     Comments: It is painful, but the patient can fully range her left shoulder. Pain is worse with lowering her  shoulder back down. There is no erythema, warmth, or swelling appreciated to the shoulder. Normal grip strength in left hand.  Skin:    General: Skin is warm and dry.  Neurological:     Mental Status: She is alert.     (all labs ordered are listed, but only abnormal results are displayed) Labs Reviewed  BASIC METABOLIC PANEL WITH GFR - Abnormal; Notable for the following components:      Result Value   Glucose, Bld 285 (*)    BUN 38 (*)    Creatinine, Ser 2.63 (*)    GFR, Estimated 21 (*)    All other components within normal limits  CBC - Abnormal; Notable for the following components:   Hemoglobin 11.5 (*)    HCT 35.9 (*)    All other components within normal limits  BRAIN NATRIURETIC PEPTIDE  URINALYSIS, ROUTINE W REFLEX MICROSCOPIC  TROPONIN I (HIGH SENSITIVITY)  TROPONIN I (HIGH SENSITIVITY)    EKG: EKG Interpretation Date/Time:  Saturday December 26 2023 23:56:00 EST Ventricular Rate:  82 PR Interval:  154 QRS Duration:  78 QT Interval:  394 QTC Calculation: 460 R Axis:   46  Text Interpretation: Normal sinus rhythm T wave inversions similar to Oct 2025 Confirmed by Freddi Hamilton [phone]) on 12/27/2023 7:07:12 AM  Radiology: DG Shoulder Left Result Date: 12/27/2023 EXAM: 1 VIEW(S) XRAY OF THE LEFT SHOULDER 12/27/2023 06:03:00 AM COMPARISON: None available. CLINICAL HISTORY: left shoulder pain, increases with movment of arm FINDINGS: BONES AND JOINTS: No acute fracture. No malalignment. There is a widened acromiohumeral space measuring 15 mm, which may be seen in the setting of a joint effusion or joint laxity. [facility] joint is unremarkable. SOFT TISSUES: No abnormal calcifications. Visualized lung is unremarkable. IMPRESSION: 1. No acute fracture or subluxation. 2. Widened acromiohumeral space measuring 15 mm, which can be seen with joint effusion or capsuloligamentous laxity. Electronically signed by: Francis Quam MD 12/27/2023 06:10 AM EST RP Workstation:  [id number]   DG Chest 2 View Result Date: 12/27/2023 EXAM: 2 VIEW(S) XRAY OF THE CHEST 12/27/2023 12:55:00 AM COMPARISON: None available. CLINICAL HISTORY: pain in her lt neck and shoulder chest for 2 days FINDINGS: LUNGS AND PLEURA: No focal pulmonary opacity. No pleural effusion. No pneumothorax. HEART AND MEDIASTINUM: No acute abnormality of the cardiac and mediastinal silhouettes. BONES AND SOFT TISSUES: No acute osseous abnormality. IMPRESSION: 1. No acute cardiopulmonary process. Electronically signed by: Oneil Devonshire MD 12/27/2023 01:03 AM EST RP Workstation: [id number]     Procedures   Medications Ordered in the ED  lactated ringers  infusion (has no administration in time range)  lactated ringers  bolus 1,000 mL (0 mLs Intravenous Stopped 12/27/23 0707)                                    Medical Decision Making Amount and/or Complexity of Data Reviewed Labs: ordered.    Details: Normal troponin x 2. AKI. Radiology: ordered and independent interpretation performed.    Details: No shoulder fracture ECG/medicine tests: ordered and independent interpretation performed.    Details: No ischemia  Risk Prescription drug management. Decision regarding hospitalization.   From a shoulder perspective, I do not think this represents an atypical ACS.  Troponins are negative, ECG without acute ischemia.  There is question of joint effusion versus ligament laxity.  Clinically I do not think this is a shoulder effusion.  I certainly do not think this is a septic joint based on exam.  I did do a quick bedside ultrasound and do not see an obvious amount of fluid in the joint.  I do not think an arthrocentesis is necessary.  She does unfortunately have an AKI.  Perhaps this is from medications and/or extra Lasix  earlier in the week.  Vital signs are reassuring.  However I think with the significant bump in her creatinine she will need evaluation and treatment.  She has received 1 L of fluid that  just ended prior to me seeing her from triage, we will continue fluids and admit for observation.  Discussed with Dr. Georgina for admission.  Final diagnoses:  Acute kidney injury    ED Discharge Orders     None          Freddi Hamilton, MD 12/27/23 [phone]

## 2023-12-27 NOTE — Progress Notes (Signed)
° ° °  EXPEDITER LEVEL LOADING ASSESSMENT NOTE  Patient Name: Tina [name]  DOB:08-07-1970 Date of Admission: 12/26/2023  Date of Assessment:12/27/2023   -------------------------------------------------------------------------------------------------------------------   Brief clinical summary: 53 year old female presents with a chief complaint of left shoulder pain.   Is there Bed Availability at another [facility]? Yes  If yes, what facility: [facility]  Level of Care Needed:  Yes  MD Agree to transfer: Yes  Patient agrees to transfer: Yes    -------------------------------------------------------------------------------------------------------------------  [facility] RN Expediter, Jaelynn Currier S Malavika Lira Please contact us  directly via secure chat (search for [facility]) or by calling us  at [phone] [facility]).

## 2023-12-27 NOTE — ED Notes (Signed)
 Patient was upset about the long wait and left.

## 2023-12-27 NOTE — ED Notes (Signed)
 CARELINK  CALL NURSE FOR PT PICK UP

## 2023-12-27 NOTE — ED Triage Notes (Signed)
 The pt is c/o lt shoulder and lt neck pain for 2 days  when she last had her heart attack this was the pain she had   she also has dizziness some sob and nausea

## 2023-12-27 NOTE — ED Provider Triage Note (Signed)
 Emergency Medicine Provider Triage Evaluation Note  Tina [name] , a 53 y.o. female  was evaluated in triage.  Pt complains of left shoulder pain for the past few days.  Pain runs down the arm.  States that she has some mild intermittent chest pain.  Denies SOB. Denies abdominal pain.  Has had some dysuria.  Review of Systems  Positive: Left arm pain, chest pain, dysuria Negative: fever  Physical Exam  BP 121/65 (BP Location: Right Arm)   Pulse 84   Temp 98.2 F (36.8 C)   Resp 18   Ht 5' 6 (1.676 m)   Wt 99.3 kg   LMP  (LMP Unknown)   SpO2 99%   BMI 35.33 kg/m  Gen:   Awake, no distress   Resp:  Normal effort  MSK:   Moves extremities without difficulty  Other:    Medical Decision Making  Medically screening exam initiated at 1:48 AM.  Appropriate orders placed.  Tina [name] was informed that the remainder of the evaluation will be completed by another provider, this initial triage assessment does not replace that evaluation, and the importance of remaining in the ED until their evaluation is complete.     Vicky Charleston, PA-C 12/27/23 [phone]

## 2023-12-27 NOTE — ED Notes (Signed)
 Pt reports left shoulder pain.  This pain is felt with any movement of left arm and when she goes to lay down on that side.

## 2023-12-27 NOTE — ED Notes (Signed)
 Pt pulled back to triage for re-evaluation and treatment.  IV started, fluids started.  Delay explained to pt.  Family is at the bedside.

## 2023-12-27 NOTE — H&P (Addendum)
 History and Physical    Patient: Tina [name] FMW:[id number] DOB: 1970/07/30 DOA: 12/26/2023 DOS: the patient was seen and examined on 12/27/2023 PCP: Gerome Brunet, DO  Patient coming from: Home  Chief Complaint:  Chief Complaint  Patient presents with   Shoulder Pain   HPI: Tina [name] is a 53 y.o. female with medical history significant of CVA (2011), NSTEMI treated with OM2 PCI 09/05/2020, HFpEF (EF 55-60%), OSA (not on CPAP), HTN, HLD, DM2, GERD, migraine, RLS, morbid obesity, and anxiety/depression p/w AKI.  Pt is a limited historian. From what I can gather, pt presented with L shoulder pain that concerned her given her previous NSTEMI and PCI.   In the ED, pt AFVSS. Labs notable for Cr 2.6 (baseline 1.0-1.1). EDP started IVF and requested medicine admission.   Review of Systems: As mentioned in the history of present illness. All other systems reviewed and are negative. Past Medical History:  Diagnosis Date   Allergy    Anemia    DURING MENSES--HAS HEAVY BLEEDING WITH PERODS   Angioedema 08/13/2020   Anxiety    Back pain, chronic    ongoing   Blood transfusion    IN 2012  AFTER C -SECTION   Cerebral thrombosis with cerebral infarction (HCC) 06/2009   RIGHT SIDED WEAKNESS ( ARM AND LEG ) AND SPASMS-remains with slight weakness and vertigo.   CKD (chronic kidney disease)    Constipation    hx   Dependent on wheelchair    motorized w/c for long distance per pt   Depression    Diabetes mellitus    Diabetic neuropathy (HCC)    BOTH FEET --COMES AND GOES   Edema, lower extremity    Endometriosis    Fatty liver    GERD (gastroesophageal reflux disease)    with pregnancy   H/O eye surgery    Headache(784.0)    MIGRAINES--NOT REALLY HEADACHE-MORE LIKE PRESSURE SENSATION IN HEAD-FEELS DIZZIY AND  FAINT AS THE PRESSURE RESOLVES   Hernia, incisional, RLQ, s/p lap repair Sep 2013 09/02/2011   History of vertigo 03/22/2018   Hx of migraines 10/19/2011    Hyperlipidemia    Hypertension    Leg pain, right    like bad Charley horse   Multiple food allergies    Panic disorder without agoraphobia    Rash    HANDS, ARMS --STATES HX OF RASH EVER SINCE CHILDBIRTH/PREGNANCY.  STATES THE RASH OFTEN OCCURS WHEN SHE IS REALLY STRESSED.goes and comes-presently left ring finger   Restless leg syndrome    DIAGNOSED BY SLEEP STUDY - PT TOLD SHE DID NOT HAVE SLEEP APNEA   Right rotator cuff tear    PAIN IN RIGHT SHOULDER   SBO (small bowel obstruction) (HCC) 06/03/2012   Shortness of breath    hx   Sleep apnea    does not use CPAP   Spastic hemiplegia affecting dominant side (HCC)    Stomach problems    Stroke (HCC)    Ventral hernia    RIGHT LOWER QUADRANT-CAUSING SOME PAIN   Weakness of right side of body    Past Surgical History:  Procedure Laterality Date   APPLICATION OF WOUND VAC N/A 05/25/2015   Procedure: APPLICATION OF WOUND VAC;  Surgeon: Elspeth Schultze, MD;  Location: [facility];  Service: General;  Laterality: N/A;   CARDIAC CATHETERIZATION     CESAREAN SECTION  2012   COLONOSCOPY     CORONARY STENT INTERVENTION N/A 09/05/2020   Procedure: CORONARY STENT INTERVENTION;  Surgeon: Elmira Newman PARAS, MD;  Location: [facility];  Service: Cardiovascular;  Laterality: N/A;   CORONARY ULTRASOUND/IVUS N/A 09/05/2020   Procedure: Intravascular Ultrasound/IVUS;  Surgeon: Elmira Newman PARAS, MD;  Location: [facility];  Service: Cardiovascular;  Laterality: N/A;   DIAGNOSTIC LAPAROSCOPY     ESOPHAGOGASTRODUODENOSCOPY N/A 06/03/2012   Procedure: ESOPHAGOGASTRODUODENOSCOPY (EGD);  Surgeon: Renaye Sous, MD;  Location: [facility] ENDOSCOPY;  Service: Endoscopy;  Laterality: N/A;   EXCISION MASS ABDOMINAL N/A 05/25/2015   Procedure: ABDOMINAL WALL EXPLORATION EXCISION OF SEROMA REMOVAL OF REDUNDANT SKIN ;  Surgeon: Elspeth Schultze, MD;  Location: [facility];  Service: General;  Laterality: N/A;   EYE SURGERY     Eye laser for vessel  hemorrhaging   fybroid removal     HERNIA REPAIR  10/03/2011   ventral hernia repair   INJECTION, INTRAVITREAL, THERAPEUTIC AGENT, EYE Left 01/09/2023   Procedure: Injection, Intravitreal, therapeutic agent, eye;  Surgeon: Tobie Baptist, MD;  Location: [facility] OR;  Service: Ophthalmology;  Laterality: Left;   INSERTION OF MESH N/A 02/04/2013   Procedure: INSERTION OF MESH;  Surgeon: Elspeth KYM Schultze, MD;  Location: [facility];  Service: General;  Laterality: N/A;   LEFT HEART CATH AND CORONARY ANGIOGRAPHY N/A 09/05/2020   Procedure: LEFT HEART CATH AND CORONARY ANGIOGRAPHY;  Surgeon: Elmira Newman PARAS, MD;  Location: [facility];  Service: Cardiovascular;  Laterality: N/A;   PARS PLANA VITRECTOMY Left 08/24/2022   Procedure: PARS PLANA VITRECTOMY 25 GAUGE FOR ENDOPHTHALMITIS; INJECTION OF ANTIBIOTIC;  Surgeon: Tobie Baptist, MD;  Location: [facility] OR;  Service: Ophthalmology;  Laterality: Left;   sciatic nerve fusion  09/01/2023   at surgical center   TRIGGER FINGER RELEASE Left 11/05/2023   Procedure: RELEASE, A1 PULLEY, FOR LEFT TRIGGER FINGER;  Surgeon: Murrell Drivers, MD;  Location: [facility];  Service: Orthopedics;  Laterality: Left;   UMBILICAL HERNIA REPAIR N/A 02/04/2013   Procedure: LAPAROSCOPIC ventral wall hernia repair LAPAROSCOPIC LYSIS OF ADHESIONS laparoscopic exploration of abdomen ;  Surgeon: Elspeth KYM Schultze, MD;  Location: [facility];  Service: General;  Laterality: N/A;   UPPER GASTROINTESTINAL ENDOSCOPY     URETER REVISION     Bilateral twisted   UTERINE FIBROID SURGERY     2 SURGERIES FOR FIBROIDS   VENTRAL HERNIA REPAIR  10/03/2011   Procedure: LAPAROSCOPIC VENTRAL HERNIA;  Surgeon: Elspeth KYM Schultze, MD;  Location: [facility];  Service: General;  Laterality: N/A;   Social History:  reports that she has never smoked. She has never used smokeless tobacco. She reports that she does not currently use drugs. She reports that she does not drink alcohol.  Allergies[1]  Family History   Problem Relation Age of Onset   Diabetes Father    Kidney disease Father    Depression Father    Drug abuse Father    Allergic rhinitis Mother    Eczema Mother    Urticaria Mother    Depression Mother    Anxiety disorder Mother    Bipolar disorder Mother    Alcoholism Mother    Drug abuse Mother    Eating disorder Mother    Diabetes Maternal Grandmother    Hyperlipidemia Paternal Grandmother    Stroke Paternal Grandmother    Eczema Sister    Urticaria Sister    Colon cancer Paternal Uncle    Other Neg Hx    Angioedema Neg Hx    Asthma Neg Hx    Colon polyps Neg Hx  Esophageal cancer Neg Hx    Rectal cancer Neg Hx    Stomach cancer Neg Hx     Prior to Admission medications  Medication Sig Start Date End Date Taking? Authorizing Provider  allopurinol  (ZYLOPRIM ) 100 MG tablet Take 100 mg by mouth daily.   Yes [provider]  amLODipine  (NORVASC ) 5 MG tablet Take 1 tablet (5 mg total) by mouth daily. 09/06/20  Yes Tolia, Sunit, DO  aspirin  EC 81 MG tablet Take 81 mg by mouth daily. Swallow whole.   Yes [provider]  atorvastatin  (LIPITOR) 40 MG tablet Take 1 tablet (40 mg total) by mouth at bedtime. 09/06/20  Yes Tolia, Sunit, DO  carvedilol  (COREG ) 25 MG tablet Take 25 mg by mouth 2 (two) times daily with a meal.   Yes [provider]  clopidogrel  (PLAVIX ) 75 MG tablet TAKE ONE TABLET BY MOUTH ONCE DAILY 04/10/22  Yes Tolia, Sunit, DO  DULoxetine  (CYMBALTA ) 20 MG capsule Take 20-40 mg by mouth See admin instructions. Take 20 mg by mouth in the morning and 40 mg at bedtime   Yes [provider]  famotidine  (PEPCID ) 20 MG tablet Take 20 mg by mouth as needed for indigestion or heartburn.   Yes [provider]  furosemide  (LASIX ) 40 MG tablet Take 40 mg by mouth in the morning.   Yes [provider]  gabapentin  (NEURONTIN ) 300 MG capsule Take 300 mg by mouth 2 (two) times daily. 12/14/20  Yes [provider]   hydrALAZINE  (APRESOLINE ) 10 MG tablet TAKE ONE TABLET BY MOUTH THREE TIMES DAILY Patient taking differently: Take 10-20 mg by mouth See admin instructions. Take 10 mg in the morning and 20 mg mouth at night 04/10/22  Yes Tolia, Sunit, DO  Insulin  Aspart FlexPen (NOVOLOG ) 100 UNIT/ML Inject 3-5 Units into the skin 3 (three) times daily as needed. Dose per sliding scale with meals 09/20/20  Yes [provider]  isosorbide  mononitrate (IMDUR ) 60 MG 24 hr tablet TAKE 1 TABLET BY MOUTH ONCE DAILY 11/11/23  Yes Tolia, Sunit, DO  spironolactone  (ALDACTONE ) 25 MG tablet Take 25 mg by mouth daily. 06/24/21  Yes [provider]  tirzepatide  (MOUNJARO ) 10 MG/0.5ML Pen Inject 10 mg into the skin once a week. 09/22/23  Yes Tolia, Sunit, DO  tiZANidine  (ZANAFLEX ) 2 MG tablet TAKE 1 TABLET BY MOUTH AT BEDTIME 10/13/23  Yes Kirsteins, Prentice BRAVO, MD  traMADol  (ULTRAM ) 50 MG tablet Take 1 tablet (50 mg total) by mouth 2 (two) times daily. 12/15/23  Yes Kirsteins, Prentice BRAVO, MD  Continuous Blood Gluc Sensor (FREESTYLE LIBRE 2 SENSOR) MISC Inject 1 Device into the skin every 14 (fourteen) days. 08/23/19   [provider]  EPINEPHrine  (AUVI-Q ) 0.3 mg/0.3 mL IJ SOAJ injection Inject 0.3 mg into the muscle as needed for anaphylaxis. Patient not taking: Reported on 12/27/2023 08/16/20   Cari Arlean HERO, FNP  diphenhydrAMINE  (BENADRYL ) 25 MG tablet Take 1 tablet (25 mg total) by mouth every 8 (eight) hours as needed for up to 2 days for itching. 08/14/20 08/15/20  Fairy Frames, MD    Physical Exam: Vitals:   12/26/23 2336 12/27/23 0003 12/27/23 0456  BP: 121/65  116/75  Pulse: 84  73  Resp: 18  18  Temp: 98.2 F (36.8 C)  (!) 97.4 F (36.3 C)  SpO2: 99%  100%  Weight:  99.3 kg   Height:  5' 6 (1.676 m)    General: Alert, oriented x3, resting comfortably in no acute  distress Respiratory: Lungs clear to auscultation bilaterally with normal respiratory effort; no w/r/r Cardiovascular: Regular rate and  rhythm w/o m/r/g   Data Reviewed:  Lab Results  Component Value Date   WBC 7.0 12/27/2023   HGB 11.5 (L) 12/27/2023   HCT 35.9 (L) 12/27/2023   MCV 84.1 12/27/2023   PLT 296 12/27/2023   Lab Results  Component Value Date   GLUCOSE 285 (H) 12/27/2023   CALCIUM  9.1 12/27/2023   NA 138 12/27/2023   K 4.6 12/27/2023   CO2 26 12/27/2023   CL 101 12/27/2023   BUN 38 (H) 12/27/2023   CREATININE 2.63 (H) 12/27/2023   Lab Results  Component Value Date   ALT 15 06/05/2021   AST 18 06/05/2021   ALKPHOS 49 06/05/2021   BILITOT 0.9 06/05/2021   Lab Results  Component Value Date   INR 0.9 09/05/2020   INR 1.13 03/07/2014   INR 0.93 02/02/2014   Radiology: DG Shoulder Left Result Date: 12/27/2023 EXAM: 1 VIEW(S) XRAY OF THE LEFT SHOULDER 12/27/2023 06:03:00 AM COMPARISON: None available. CLINICAL HISTORY: left shoulder pain, increases with movment of arm FINDINGS: BONES AND JOINTS: No acute fracture. No malalignment. There is a widened acromiohumeral space measuring 15 mm, which may be seen in the setting of a joint effusion or joint laxity. [facility] joint is unremarkable. SOFT TISSUES: No abnormal calcifications. Visualized lung is unremarkable. IMPRESSION: 1. No acute fracture or subluxation. 2. Widened acromiohumeral space measuring 15 mm, which can be seen with joint effusion or capsuloligamentous laxity. Electronically signed by: Francis Quam MD 12/27/2023 06:10 AM EST RP Workstation: [id number]   DG Chest 2 View Result Date: 12/27/2023 EXAM: 2 VIEW(S) XRAY OF THE CHEST 12/27/2023 12:55:00 AM COMPARISON: None available. CLINICAL HISTORY: pain in her lt neck and shoulder chest for 2 days FINDINGS: LUNGS AND PLEURA: No focal pulmonary opacity. No pleural effusion. No pneumothorax. HEART AND MEDIASTINUM: No acute abnormality of the cardiac and mediastinal silhouettes. BONES AND SOFT TISSUES: No acute osseous abnormality. IMPRESSION: 1. No acute cardiopulmonary process. Electronically  signed by: Oneil Devonshire MD 12/27/2023 01:03 AM EST RP Workstation: [id number]    Assessment and Plan: 16F h/o CVA (2011), NSTEMI treated with OM2 PCI 09/05/2020, HFpEF (EF 55-60%), OSA (not on CPAP), HTN, HLD, DM2, GERD, migraine, RLS, morbid obesity, and anxiety/depression p/w AKI.  AKI -MIVF: NS at 100cc/h for 24h -Strict I&Os and daily weights (standing preferred) -F/u PVR to r/o post-renal obstruction -F/u BMP daily -Renally dose medications for CrCl -Avoid lovenox , NSAIDs, morphine , Fleet's phosphate enema, regular insulin , contrast; no gadolinium for MRI to avoid nephrogenic systemic fibrosis -Consider renal US  and nephrology consult if worsening AKI  HTN -PTA amlodipine  5mg  daily, Coreg  25mg  BID, and Imdur  60mg  daily -HOLD pta lasix  40mg  daily and spironolactone  25mg  daily -Consider decreasing lasix  from 40mg -->20mg  daily and d/c spironolactone  25mg  daily altogether given pt with 50lbs in the past 3 months  HFpEF CAD s/p PCI -PTA Coreg   CVA -PTA ASA, Plavix , and atorvastatin     Advance Care Planning:   Code Status: Full Code   Consults: N/A  Family Communication: Spouse  Severity of Illness: The appropriate patient status for this patient is INPATIENT. Inpatient status is judged to be reasonable and necessary in order to provide the required intensity of service to ensure the patient's safety. The patient's presenting symptoms, physical exam findings, and initial radiographic and laboratory data in the context of their chronic comorbidities is felt to place them at high risk for further  clinical deterioration. Furthermore, it is not anticipated that the patient will be medically stable for discharge from the hospital within 2 midnights of admission.   * I certify that at the point of admission it is my clinical judgment that the patient will require inpatient hospital care spanning beyond 2 midnights from the point of admission due to high intensity of service, high risk for  further deterioration and high frequency of surveillance required.*   ------- I spent 55 minutes reviewing previous notes, at the bedside counseling/discussing the treatment plan, and performing clinical documentation.  Author: Marsha Ada, MD 12/27/2023 8:48 AM  For on call review [email or web address].      [1]  Allergies Allergen Reactions   Contrast Media [Iodinated Contrast Media] Shortness Of Breath and Other (See Comments)    Difficulty breathing   Iodine Anaphylaxis   Iohexol  Hives, Nausea And Vomiting, Swelling and Other (See Comments)     Desc: Magnevist-gadolinium-difficulty breathing, throat swelling    Midazolam  Hcl Anaphylaxis    Difficulty breathing   Other Shortness Of Breath, Itching and Other (See Comments)    Patient is allergic to all nuts except peanuts, which are NOT nuts- they are legumes   Shellfish Allergy Anaphylaxis   Shellfish Protein-Containing Drug Products Anaphylaxis and Other (See Comments)    Other reaction(s): Unknown   Valsartan  Swelling    angioedema   Metformin And Related Diarrhea, Nausea And Vomiting and Other (See Comments)    Dehydration, also   Farxiga [Dapagliflozin] Nausea Only and Other (See Comments)    UTI, headaches and muscle aches, also   Hydralazine  Other (See Comments)    Reaction not recalled   Saxagliptin Hives, Nausea And Vomiting and Other (See Comments)    ONGLYZA- dehydration, also   Avandia [Rosiglitazone Maleate] Hives and Other (See Comments)   Geodon [Ziprasidone] Other (See Comments)    Reaction not recalled   Kiwi Extract Itching and Swelling   Latex Itching

## 2023-12-28 ENCOUNTER — Ambulatory Visit: Payer: Self-pay | Admitting: Family Medicine

## 2023-12-28 ENCOUNTER — Telehealth: Payer: Self-pay | Admitting: Family Medicine

## 2023-12-28 DIAGNOSIS — N179 Acute kidney failure, unspecified: Principal | ICD-10-CM

## 2023-12-28 LAB — CBC
HCT: 32.2 % — ABNORMAL LOW (ref 36.0–46.0)
Hemoglobin: 10.4 g/dL — ABNORMAL LOW (ref 12.0–15.0)
MCH: 26.9 pg (ref 26.0–34.0)
MCHC: 32.3 g/dL (ref 30.0–36.0)
MCV: 83.4 fL (ref 80.0–100.0)
Platelets: 265 K/uL (ref 150–400)
RBC: 3.86 MIL/uL — ABNORMAL LOW (ref 3.87–5.11)
RDW: 13.2 % (ref 11.5–15.5)
WBC: 5.3 K/uL (ref 4.0–10.5)
nRBC: 0 % (ref 0.0–0.2)

## 2023-12-28 LAB — BASIC METABOLIC PANEL WITH GFR
Anion gap: 7 (ref 5–15)
BUN: 30 mg/dL — ABNORMAL HIGH (ref 6–20)
CO2: 26 mmol/L (ref 22–32)
Calcium: 9 mg/dL (ref 8.9–10.3)
Chloride: 106 mmol/L (ref 98–111)
Creatinine, Ser: 1.48 mg/dL — ABNORMAL HIGH (ref 0.44–1.00)
GFR, Estimated: 42 mL/min — ABNORMAL LOW (ref >60)
Glucose, Bld: 214 mg/dL — ABNORMAL HIGH (ref 70–99)
Potassium: 4.6 mmol/L (ref 3.5–5.1)
Sodium: 139 mmol/L (ref 135–145)

## 2023-12-28 LAB — GLUCOSE, CAPILLARY
Glucose-Capillary: 208 mg/dL — ABNORMAL HIGH (ref 70–99)
Glucose-Capillary: 224 mg/dL — ABNORMAL HIGH (ref 70–99)

## 2023-12-28 MED ORDER — ENOXAPARIN SODIUM 40 MG/0.4ML IJ SOSY
40.0000 mg | PREFILLED_SYRINGE | INTRAMUSCULAR | Status: DC
  Administered 2023-12-28: 08:00:00 40 mg via SUBCUTANEOUS
  Filled 2023-12-28: qty 0.4

## 2023-12-28 NOTE — Progress Notes (Signed)
°   12/28/23 0833  TOC Brief Assessment  Insurance and Status Reviewed  Patient has primary care physician Yes  Home environment has been reviewed single family home  Prior level of function: independent  Prior/Current Home Services No current home services  Social Drivers of Health Review SDOH reviewed no interventions necessary  Readmission risk has been reviewed Yes  Transition of care needs no transition of care needs at this time    Signed: Heather Saltness, MSW, LCSW Clinical Social Worker Inpatient Care Management 12/28/2023 8:33 AM

## 2023-12-28 NOTE — Plan of Care (Signed)
°  Problem: Health Behavior/Discharge Planning: Goal: Ability to manage health-related needs will improve Outcome: Progressing   Problem: Clinical Measurements: Goal: Ability to maintain clinical measurements within normal limits will improve Outcome: Progressing Goal: Will remain free from infection Outcome: Progressing Goal: Respiratory complications will improve Outcome: Progressing Goal: Cardiovascular complication will be avoided Outcome: Progressing   Problem: Activity: Goal: Risk for activity intolerance will decrease Outcome: Progressing   Problem: Nutrition: Goal: Adequate nutrition will be maintained Outcome: Progressing   Problem: Coping: Goal: Level of anxiety will decrease Outcome: Progressing   Problem: Elimination: Goal: Will not experience complications related to bowel motility Outcome: Progressing Goal: Will not experience complications related to urinary retention Outcome: Progressing   Problem: Pain Managment: Goal: General experience of comfort will improve and/or be controlled Outcome: Progressing   Problem: Safety: Goal: Ability to remain free from injury will improve Outcome: Progressing   Problem: Skin Integrity: Goal: Risk for impaired skin integrity will decrease Outcome: Progressing   Problem: Education: Goal: Ability to describe self-care measures that may prevent or decrease complications (Diabetes Survival Skills Education) will improve Outcome: Progressing Goal: Individualized Educational Video(s) Outcome: Progressing   Problem: Coping: Goal: Ability to adjust to condition or change in health will improve Outcome: Progressing   Problem: Fluid Volume: Goal: Ability to maintain a balanced intake and output will improve Outcome: Progressing   Problem: Health Behavior/Discharge Planning: Goal: Ability to identify and utilize available resources and services will improve Outcome: Progressing Goal: Ability to manage health-related  needs will improve Outcome: Progressing   Problem: Metabolic: Goal: Ability to maintain appropriate glucose levels will improve Outcome: Progressing   Problem: Nutritional: Goal: Maintenance of adequate nutrition will improve Outcome: Progressing Goal: Progress toward achieving an optimal weight will improve Outcome: Progressing   Problem: Skin Integrity: Goal: Risk for impaired skin integrity will decrease Outcome: Progressing   Problem: Tissue Perfusion: Goal: Adequacy of tissue perfusion will improve Outcome: Progressing

## 2023-12-28 NOTE — Discharge Summary (Signed)
 Physician Discharge Summary  Tina [name] FMW:[id number] DOB: Mar 23, 1970 DOA: 12/26/2023  PCP: Gerome Brunet, DO  Admit date: 12/26/2023 Discharge date: 12/28/2023  Admitted From: Home Disposition:  Home  Discharge Condition:Stable CODE STATUS:FULL Diet recommendation:Carb Modified  Brief/Interim Summary: Tina [name] is a 53 y.o. female with medical history significant of CVA (2011), NSTEMI treated with OM2 PCI 09/05/2020, HFpEF (EF 55-60%), OSA (not on CPAP), HTN, HLD, DM2, GERD, migraine, RLS, morbid obesity, and anxiety/depression p/w left shoulder pain.  On presentation, she was found to be dehydrated, creatinine was found to be in the range of 2.6.  Patient was admitted for AKI and was started on IV fluid.  This morning, she is hemodynamically stable, comfortable.  Creatinine has improved to 1.4.  She appears overall euvolemic today.  Medically stable for discharge home today.  Following problems were addressed during the hospitalization:  AKI - Baseline creatinine around 1.2-1.3.  Kidney function close to baseline today.  Check BMP in a week  HTN -PTA amlodipine  5mg  daily, Coreg  25mg  BID, and Imdur  60mg  daily.  Continue these medications -HOLD pta lasix  40mg  daily and spironolactone  25mg  daily   HFpEF CAD s/p PCI - Takes above medications.  Last echo showed EF of 55 to 60%, grade 1 diastolic dysfunction.  She was dehydrated on presentation.  Will hold Lasix  and spironolactone  for now.  I have recommended her to follow-up with her cardiologist to check if she needs to resume these medications   CVA -PTA ASA, Plavix , and atorvastatin   Left shoulder pain CT of the left shoulder did not show any fracture or dislocation, showed mild arthritic changes.  Follow-up with PCP for further management  Obesity: BMI 35.3   Discharge Diagnoses:  Principal Problem:   AKI (acute kidney injury)    Discharge Instructions  Discharge Instructions     Diet Carb Modified    Complete by: As directed    Discharge instructions   Complete by: As directed    1)Please take your medications as instructed 2)We are currently holding Lasix  and spironolactone .  Follow-up with your cardiologist as an outpatient to discuss if you need to resume these medications. 3)Follow up with your PCP in a week.  Do a BMP test to check your kidney function during the follow-up.  Monitor your blood sugars at home.  Continue to follow-up with your PCP for further management of your diabetes   Increase activity slowly   Complete by: As directed       Allergies as of 12/28/2023       Reactions   Contrast Media [iodinated Contrast Media] Shortness Of Breath, Other (See Comments)   Difficulty breathing   Iodine Anaphylaxis   Iohexol  Hives, Nausea And Vomiting, Swelling, Other (See Comments)    Desc: Magnevist-gadolinium-difficulty breathing, throat swelling   Midazolam  Hcl Anaphylaxis   Difficulty breathing   Other Shortness Of Breath, Itching, Other (See Comments)   Patient is allergic to all nuts except peanuts, which are NOT nuts- they are legumes   Shellfish Allergy Anaphylaxis   Shellfish Protein-containing Drug Products Anaphylaxis, Other (See Comments)   Other reaction(s): Unknown   Valsartan  Swelling   angioedema   Metformin And Related Diarrhea, Nausea And Vomiting, Other (See Comments)   Dehydration, also   Farxiga [dapagliflozin] Nausea Only, Other (See Comments)   UTI, headaches and muscle aches, also   Hydralazine  Other (See Comments)   Reaction not recalled   Saxagliptin Hives, Nausea And Vomiting, Other (See Comments)   ONGLYZA- dehydration,  also   Avandia [rosiglitazone Maleate] Hives, Other (See Comments)   Geodon [ziprasidone] Other (See Comments)   Reaction not recalled   Kiwi Extract Itching, Swelling   Latex Itching        Medication List     STOP taking these medications    furosemide  40 MG tablet Commonly known as: LASIX    spironolactone   25 MG tablet Commonly known as: ALDACTONE        TAKE these medications    allopurinol  100 MG tablet Commonly known as: ZYLOPRIM  Take 100 mg by mouth daily.   amLODipine  5 MG tablet Commonly known as: NORVASC  Take 1 tablet (5 mg total) by mouth daily.   aspirin  EC 81 MG tablet Take 81 mg by mouth daily. Swallow whole.   atorvastatin  40 MG tablet Commonly known as: LIPITOR Take 1 tablet (40 mg total) by mouth at bedtime.   carvedilol  25 MG tablet Commonly known as: COREG  Take 25 mg by mouth 2 (two) times daily with a meal.   clopidogrel  75 MG tablet Commonly known as: PLAVIX  TAKE ONE TABLET BY MOUTH ONCE DAILY   DULoxetine  20 MG capsule Commonly known as: CYMBALTA  Take 20-40 mg by mouth See admin instructions. Take 20 mg by mouth in the morning and 40 mg at bedtime   EPINEPHrine  0.3 mg/0.3 mL Soaj injection Commonly known as: Auvi-Q  Inject 0.3 mg into the muscle as needed for anaphylaxis.   famotidine  20 MG tablet Commonly known as: PEPCID  Take 20 mg by mouth as needed for indigestion or heartburn.   FreeStyle Libre 2 Sensor Misc Inject 1 Device into the skin every 14 (fourteen) days.   gabapentin  300 MG capsule Commonly known as: NEURONTIN  Take 300 mg by mouth 2 (two) times daily.   hydrALAZINE  10 MG tablet Commonly known as: APRESOLINE  TAKE ONE TABLET BY MOUTH THREE TIMES DAILY What changed:  how much to take when to take this additional instructions   Insulin  Aspart FlexPen 100 UNIT/ML Commonly known as: NOVOLOG  Inject 3-5 Units into the skin 3 (three) times daily as needed. Dose per sliding scale with meals   isosorbide  mononitrate 60 MG 24 hr tablet Commonly known as: IMDUR  TAKE 1 TABLET BY MOUTH ONCE DAILY   Mounjaro  10 MG/0.5ML Pen Generic drug: tirzepatide  Inject 10 mg into the skin once a week.   tiZANidine  2 MG tablet Commonly known as: ZANAFLEX  TAKE 1 TABLET BY MOUTH AT BEDTIME   traMADol  50 MG tablet Commonly known as: ULTRAM  Take  1 tablet (50 mg total) by mouth 2 (two) times daily.        Follow-up Information     Gerome Brunet, DO. Schedule an appointment as soon as possible for a visit in 1 week(s).   Specialty: Family Medicine Contact information: [address] [place] [place] [address] [phone]                Allergies[1]  Consultations: None   Procedures/Studies: CT SHOULDER LEFT WO CONTRAST Result Date: 12/27/2023 CLINICAL DATA:  Chronic left shoulder pain EXAM: CT OF THE UPPER LEFT EXTREMITY WITHOUT CONTRAST TECHNIQUE: Multidetector CT imaging of the upper left extremity was performed according to the standard protocol. RADIATION DOSE REDUCTION: This exam was performed according to the departmental dose-optimization program which includes automated exposure control, adjustment of the mA and/or kV according to patient size and/or use of iterative reconstruction technique. COMPARISON:  None Available. FINDINGS: Bones/Joint/Cartilage No fracture or dislocation. Normal alignment. No joint effusion. Glenohumeral joint space is maintained. Mild  arthropathy of the acromioclavicular joint. Small os acromiale. Ligaments Ligaments are suboptimally evaluated by CT. Muscles and Tendons Muscles are normal.  No muscle atrophy. Soft tissue No fluid collection or hematoma. No soft tissue mass. Visualized portions of the lung are clear. IMPRESSION: 1. No acute osseous injury of the left shoulder. 2. Mild arthropathy of the acromioclavicular joint. Electronically Signed   By: Julaine Blanch M.D.   On: 12/27/2023 10:00   DG Shoulder Left Result Date: 12/27/2023 EXAM: 1 VIEW(S) XRAY OF THE LEFT SHOULDER 12/27/2023 06:03:00 AM COMPARISON: None available. CLINICAL HISTORY: left shoulder pain, increases with movment of arm FINDINGS: BONES AND JOINTS: No acute fracture. No malalignment. There is a widened acromiohumeral space measuring 15 mm, which may be seen in the setting of a joint effusion or joint  laxity. [facility] joint is unremarkable. SOFT TISSUES: No abnormal calcifications. Visualized lung is unremarkable. IMPRESSION: 1. No acute fracture or subluxation. 2. Widened acromiohumeral space measuring 15 mm, which can be seen with joint effusion or capsuloligamentous laxity. Electronically signed by: Francis Quam MD 12/27/2023 06:10 AM EST RP Workstation: [id number]   DG Chest 2 View Result Date: 12/27/2023 EXAM: 2 VIEW(S) XRAY OF THE CHEST 12/27/2023 12:55:00 AM COMPARISON: None available. CLINICAL HISTORY: pain in her lt neck and shoulder chest for 2 days FINDINGS: LUNGS AND PLEURA: No focal pulmonary opacity. No pleural effusion. No pneumothorax. HEART AND MEDIASTINUM: No acute abnormality of the cardiac and mediastinal silhouettes. BONES AND SOFT TISSUES: No acute osseous abnormality. IMPRESSION: 1. No acute cardiopulmonary process. Electronically signed by: Oneil Devonshire MD 12/27/2023 01:03 AM EST RP Workstation: [id number]      Subjective: Patient seen and examined at bedside today.  Hemodynamically stable.  Appears comfortable this morning.  No significant left shoulder pain.  Appears euvolemic. Eager to go home.  Medically stable for discharge.  Discharge Exam: Vitals:   12/27/23 2112 12/28/23 0522  BP: 133/69 125/70  Pulse: 78 81  Resp: 16   Temp: 98 F (36.7 C) 97.8 F (36.6 C)  SpO2: 100% 98%   Vitals:   12/27/23 1121 12/27/23 1801 12/27/23 2112 12/28/23 0522  BP: (!) 141/74 138/76 133/69 125/70  Pulse: 73 83 78 81  Resp: 17 16 16    Temp: 98 F (36.7 C) 98 F (36.7 C) 98 F (36.7 C) 97.8 F (36.6 C)  TempSrc: Oral Oral Oral Oral  SpO2: 100% 100% 100% 98%  Weight:      Height:        General: Pt is alert, awake, not in acute distress, obese Cardiovascular: RRR, S1/S2 +, no rubs, no gallops Respiratory: CTA bilaterally, no wheezing, no rhonchi Abdominal: Soft, NT, ND, bowel sounds + Extremities: no edema, no cyanosis    The results of significant diagnostics  from this hospitalization (including imaging, microbiology, ancillary and laboratory) are listed below for reference.     Microbiology: No results found for this or any previous visit (from the past 240 hours).   Labs: BNP (last 3 results) Recent Labs    09/03/23 1910 12/27/23 0159  BNP 18.4 4.3   Basic Metabolic Panel: Recent Labs  Lab 12/27/23 0013 12/27/23 0830 12/27/23 1855 12/28/23 0319  NA 138  --  137 139  K 4.6  --  4.6 4.6  CL 101  --  104 106  CO2 26  --  23 26  GLUCOSE 285*  --  223* 214*  BUN 38*  --  34* 30*  CREATININE 2.63* 2.74* 1.60* 1.48*  CALCIUM  9.1  --  9.1 9.0   Liver Function Tests: No results for input(s): AST, ALT, ALKPHOS, BILITOT, PROT, ALBUMIN  in the last 168 hours. No results for input(s): LIPASE, AMYLASE in the last 168 hours. No results for input(s): AMMONIA in the last 168 hours. CBC: Recent Labs  Lab 12/27/23 0013 12/28/23 0319  WBC 7.0 5.3  HGB 11.5* 10.4*  HCT 35.9* 32.2*  MCV 84.1 83.4  PLT 296 265   Cardiac Enzymes: No results for input(s): CKTOTAL, CKMB, CKMBINDEX, TROPONINI in the last 168 hours. BNP: Invalid input(s): POCBNP CBG: Recent Labs  Lab 12/27/23 1709 12/27/23 2117 12/28/23 0628 12/28/23 0708  GLUCAP 192* 253* 208* 224*   D-Dimer No results for input(s): DDIMER in the last 72 hours. Hgb A1c Recent Labs    12/27/23 1855  HGBA1C 8.2*   Lipid Profile No results for input(s): CHOL, HDL, LDLCALC, TRIG, CHOLHDL, LDLDIRECT in the last 72 hours. Thyroid  function studies No results for input(s): TSH, T4TOTAL, T3FREE, THYROIDAB in the last 72 hours.  Invalid input(s): FREET3 Anemia work up No results for input(s): VITAMINB12, FOLATE, FERRITIN, TIBC, IRON, RETICCTPCT in the last 72 hours. Urinalysis    Component Value Date/Time   COLORURINE YELLOW 12/27/2023 0732   APPEARANCEUR HAZY (A) 12/27/2023 0732   LABSPEC 1.013 12/27/2023 0732    PHURINE 5.0 12/27/2023 0732   GLUCOSEU 50 (A) 12/27/2023 0732   HGBUR NEGATIVE 12/27/2023 0732   BILIRUBINUR NEGATIVE 12/27/2023 0732   KETONESUR NEGATIVE 12/27/2023 0732   PROTEINUR 100 (A) 12/27/2023 0732   UROBILINOGEN 0.2 08/18/2014 1423   NITRITE NEGATIVE 12/27/2023 0732   LEUKOCYTESUR NEGATIVE 12/27/2023 0732   Sepsis Labs Recent Labs  Lab 12/27/23 0013 12/28/23 0319  WBC 7.0 5.3   Microbiology No results found for this or any previous visit (from the past 240 hours).  Please note: You were cared for by a hospitalist during your hospital stay. Once you are discharged, your primary care physician will handle any further medical issues. Please note that NO REFILLS for any discharge medications will be authorized once you are discharged, as it is imperative that you return to your primary care physician (or establish a relationship with a primary care physician if you do not have one) for your post hospital discharge needs so that they can reassess your need for medications and monitor your lab values.    Time coordinating discharge: 40 minutes  SIGNED:   Ivonne Mustache, MD  [facility] 12/28/2023, 10:24 AM Pager [phone]  If 7PM-7AM, please contact night-coverage [email or web address] Password [email or web address]    [1]  Allergies Allergen Reactions   Contrast Media [Iodinated Contrast Media] Shortness Of Breath and Other (See Comments)    Difficulty breathing   Iodine Anaphylaxis   Iohexol  Hives, Nausea And Vomiting, Swelling and Other (See Comments)     Desc: Magnevist-gadolinium-difficulty breathing, throat swelling    Midazolam  Hcl Anaphylaxis    Difficulty breathing   Other Shortness Of Breath, Itching and Other (See Comments)    Patient is allergic to all nuts except peanuts, which are NOT nuts- they are legumes   Shellfish Allergy Anaphylaxis   Shellfish Protein-Containing Drug Products Anaphylaxis and Other (See Comments)    Other reaction(s): Unknown    Valsartan  Swelling    angioedema   Metformin And Related Diarrhea, Nausea And Vomiting and Other (See Comments)    Dehydration, also   Farxiga [Dapagliflozin] Nausea Only and Other (See Comments)    UTI, headaches and muscle aches, also  Hydralazine  Other (See Comments)    Reaction not recalled   Saxagliptin Hives, Nausea And Vomiting and Other (See Comments)    ONGLYZA- dehydration, also   Avandia [Rosiglitazone Maleate] Hives and Other (See Comments)   Geodon [Ziprasidone] Other (See Comments)    Reaction not recalled   Kiwi Extract Itching and Swelling   Latex Itching

## 2023-12-28 NOTE — Telephone Encounter (Signed)
 Patient called to reschedule appointment was hospitalized yesterday at [facility]. Rescheduled appointment

## 2023-12-31 ENCOUNTER — Encounter: Admitting: Physical Medicine & Rehabilitation

## 2023-12-31 ENCOUNTER — Encounter: Payer: Self-pay | Admitting: Physical Medicine & Rehabilitation

## 2023-12-31 VITALS — Ht 66.0 in | Wt 221.0 lb

## 2023-12-31 DIAGNOSIS — M533 Sacrococcygeal disorders, not elsewhere classified: Secondary | ICD-10-CM | POA: Insufficient documentation

## 2023-12-31 DIAGNOSIS — I69351 Hemiplegia and hemiparesis following cerebral infarction affecting right dominant side: Secondary | ICD-10-CM | POA: Insufficient documentation

## 2023-12-31 NOTE — Patient Instructions (Signed)
°  VISIT SUMMARY: You visited us  today to discuss your persistent and worsening pain following your right sacroiliac joint fusion surgery. We also addressed your intermittent left lower back pain and recent severe left shoulder pain.  YOUR PLAN: RIGHT SACROILIAC DISORDER POST-FUSION: You are experiencing increased pain at the incision site following your right sacroiliac joint fusion surgery, which is now in the post-acute recovery phase. -Contact your surgeon's office to report the persistent pain and possible injury. -Your surgeon may order radiographic imaging to assess the situation. -Consider physical therapy options, either for SI joint rehabilitation or neurorehabilitation for stroke recovery. -Avoid NSAIDs due to your renal impairment. -A refill for tramadol  has been ordered for pain management.  LEFT SACROILIAC JOINT PAIN: You have intermittent left lower back pain that is less severe than the right side and is triggered by activity. -Continue with conservative management using rest and a heating pad as needed.                      Contains text generated by [employer].                                 Contains text generated by [employer].

## 2023-12-31 NOTE — Progress Notes (Signed)
 Subjective:    Patient ID: Tina [name], female    DOB: 1970/10/09, 53 y.o.   MRN: [id number]  HPI Discussed the use of AI scribe software for clinical note transcription with the patient, who gave verbal consent to proceed.  History of Present Illness Tina [name] is a 53 year old female with right sacroiliac fusion who presents for evaluation of persistent and worsening pain following sacroiliac fusion.  She underwent right sacroiliac joint fusion in August 2025. Initially, pain was located higher in the back, but currently pain is localized to the surgical incision site. She describes a sensation of ripping and reports that recovery was difficult, with inability to ambulate or use the bathroom due to pain. Presently, pain is worse than prior to surgery and is exacerbated by activity. She did not attend formal physical therapy but performed home exercises as instructed. Symptoms initially improved, but following the ripping sensation, pain worsened and now occurs more rapidly with activity. She is currently beyond the initial 10-week recovery period and is in the post-acute phase. She uses a heating pad and rest for symptom relief. Tramadol  is used for pain control, which also alleviates foot pain. She avoids NSAIDs due to concerns regarding renal function.  She experiences intermittent left lower back pain, similar in location to prior symptoms. This pain is triggered by lifting or overexertion and resolves with rest and use of a heating pad.  She recently developed severe left shoulder pain, prompting an emergency room visit. She has a history of right shoulder pain associated with prior myocardial infarction. During the recent episode, she was found to be severely dehydrated with reduced renal function. Imaging of the left shoulder was performed during her recent emergency room visit. No trauma or fall preceded the onset of shoulder pain. She was informed that findings were  consistent with arthritis. She has previously undergone hand surgery for trigger fingers.    Pain Inventory Average Pain 6 Pain Right Now 0 My pain is sharp and aching  In the last 24 hours, has pain interfered with the following? General activity 3 Relation with others 1 Enjoyment of life 3  What TIME of day is your pain at its worst? morning , daytime, evening, and night  Sleep (in general) Fair  Pain is worse with: bending and some activites Pain improves with: rest and medication Relief from Meds: 9  Family History  Problem Relation Age of Onset   Diabetes Father    Kidney disease Father    Depression Father    Drug abuse Father    Allergic rhinitis Mother    Eczema Mother    Urticaria Mother    Depression Mother    Anxiety disorder Mother    Bipolar disorder Mother    Alcoholism Mother    Drug abuse Mother    Eating disorder Mother    Diabetes Maternal Grandmother    Hyperlipidemia Paternal Grandmother    Stroke Paternal Grandmother    Eczema Sister    Urticaria Sister    Colon cancer Paternal Uncle    Other Neg Hx    Angioedema Neg Hx    Asthma Neg Hx    Colon polyps Neg Hx    Esophageal cancer Neg Hx    Rectal cancer Neg Hx    Stomach cancer Neg Hx    Social History   Socioeconomic History   Marital status: Married    Spouse name: Garnette   Number of children: 3   Years  of education: BA degree   Highest education level: Not on file  Occupational History   Occupation: stay at home mom    Employer: UNEMPLOYED  Tobacco Use   Smoking status: Never   Smokeless tobacco: Never  Vaping Use   Vaping status: Never Used  Substance and Sexual Activity   Alcohol use: No    Alcohol/week: 0.0 standard drinks of alcohol   Drug use: Not Currently   Sexual activity: Yes    Birth control/protection: None  Other Topics Concern   Not on file  Social History Narrative   Patient is married with 2 children.   Patient is right handed.   Patient has her  BA  degree.   Patient drinks 1 cup daily.   Social Drivers of Health   Tobacco Use: Low Risk (12/27/2023)   Patient History    Smoking Tobacco Use: Never    Smokeless Tobacco Use: Never    Passive Exposure: Not on file  Financial Resource Strain: Not on file  Food Insecurity: No Food Insecurity (12/27/2023)   Epic    Worried About Programme Researcher, Broadcasting/film/video in the Last Year: Never true    Ran Out of Food in the Last Year: Never true  Transportation Needs: No Transportation Needs (12/27/2023)   Epic    Lack of Transportation (Medical): No    Lack of Transportation (Non-Medical): No  Physical Activity: Not on file  Stress: Not on file  Social Connections: Not on file  Depression (PHQ2-9): Low Risk (12/31/2023)   Depression (PHQ2-9)    PHQ-2 Score: 0  Alcohol Screen: Not on file  Housing: Low Risk (12/27/2023)   Epic    Unable to Pay for Housing in the Last Year: No    Number of Times Moved in the Last Year: 0    Homeless in the Last Year: No  Utilities: Not At Risk (12/27/2023)   Epic    Threatened with loss of utilities: No  Health Literacy: Not on file   Past Surgical History:  Procedure Laterality Date   APPLICATION OF WOUND VAC N/A 05/25/2015   Procedure: APPLICATION OF WOUND VAC;  Surgeon: Elspeth Schultze, MD;  Location: [facility];  Service: General;  Laterality: N/A;   CARDIAC CATHETERIZATION     CESAREAN SECTION  2012   COLONOSCOPY     CORONARY STENT INTERVENTION N/A 09/05/2020   Procedure: CORONARY STENT INTERVENTION;  Surgeon: Elmira Newman PARAS, MD;  Location: [facility];  Service: Cardiovascular;  Laterality: N/A;   CORONARY ULTRASOUND/IVUS N/A 09/05/2020   Procedure: Intravascular Ultrasound/IVUS;  Surgeon: Elmira Newman PARAS, MD;  Location: [facility];  Service: Cardiovascular;  Laterality: N/A;   DIAGNOSTIC LAPAROSCOPY     ESOPHAGOGASTRODUODENOSCOPY N/A 06/03/2012   Procedure: ESOPHAGOGASTRODUODENOSCOPY (EGD);  Surgeon: Renaye Sous, MD;  Location: [facility]  ENDOSCOPY;  Service: Endoscopy;  Laterality: N/A;   EXCISION MASS ABDOMINAL N/A 05/25/2015   Procedure: ABDOMINAL WALL EXPLORATION EXCISION OF SEROMA REMOVAL OF REDUNDANT SKIN ;  Surgeon: Elspeth Schultze, MD;  Location: [facility];  Service: General;  Laterality: N/A;   EYE SURGERY     Eye laser for vessel hemorrhaging   fybroid removal     HERNIA REPAIR  10/03/2011   ventral hernia repair   INJECTION, INTRAVITREAL, THERAPEUTIC AGENT, EYE Left 01/09/2023   Procedure: Injection, Intravitreal, therapeutic agent, eye;  Surgeon: Tobie Baptist, MD;  Location: [facility] OR;  Service: Ophthalmology;  Laterality: Left;   INSERTION OF MESH N/A 02/04/2013   Procedure: INSERTION  OF MESH;  Surgeon: Elspeth KYM Schultze, MD;  Location: [facility];  Service: General;  Laterality: N/A;   LEFT HEART CATH AND CORONARY ANGIOGRAPHY N/A 09/05/2020   Procedure: LEFT HEART CATH AND CORONARY ANGIOGRAPHY;  Surgeon: Elmira Newman PARAS, MD;  [facility];  Service: Cardiovascular;  Laterality: N/A;   PARS PLANA VITRECTOMY Left 08/24/2022   Procedure: PARS PLANA VITRECTOMY 25 GAUGE FOR ENDOPHTHALMITIS; INJECTION OF ANTIBIOTIC;  Surgeon: Tobie Baptist, MD;  Location: [facility];  Service: Ophthalmology;  Laterality: Left;   sciatic nerve fusion  09/01/2023   at surgical center   TRIGGER FINGER RELEASE Left 11/05/2023   Procedure: RELEASE, A1 PULLEY, FOR LEFT TRIGGER FINGER;  Surgeon: Murrell Drivers, MD;  Location: [facility];  Service: Orthopedics;  Laterality: Left;   UMBILICAL HERNIA REPAIR N/A 02/04/2013   Procedure: LAPAROSCOPIC ventral wall hernia repair LAPAROSCOPIC LYSIS OF ADHESIONS laparoscopic exploration of abdomen ;  Surgeon: Elspeth KYM Schultze, MD;  Location: [facility];  Service: General;  Laterality: N/A;   UPPER GASTROINTESTINAL ENDOSCOPY     URETER REVISION     Bilateral twisted   UTERINE FIBROID SURGERY     2 SURGERIES FOR FIBROIDS   VENTRAL HERNIA REPAIR  10/03/2011   Procedure: LAPAROSCOPIC VENTRAL HERNIA;   Surgeon: Elspeth KYM Schultze, MD;  Location: [facility];  Service: General;  Laterality: N/A;   Past Surgical History:  Procedure Laterality Date   APPLICATION OF WOUND VAC N/A 05/25/2015   Procedure: APPLICATION OF WOUND VAC;  Surgeon: Elspeth Schultze, MD;  Location: [facility];  Service: General;  Laterality: N/A;   CARDIAC CATHETERIZATION     CESAREAN SECTION  2012   COLONOSCOPY     CORONARY STENT INTERVENTION N/A 09/05/2020   Procedure: CORONARY STENT INTERVENTION;  Surgeon: Elmira Newman PARAS, MD;  [facility];  Service: Cardiovascular;  Laterality: N/A;   CORONARY ULTRASOUND/IVUS N/A 09/05/2020   Procedure: Intravascular Ultrasound/IVUS;  Surgeon: Elmira Newman PARAS, MD;  [facility];  Service: Cardiovascular;  Laterality: N/A;   DIAGNOSTIC LAPAROSCOPY     ESOPHAGOGASTRODUODENOSCOPY N/A 06/03/2012   Procedure: ESOPHAGOGASTRODUODENOSCOPY (EGD);  Surgeon: Renaye Sous, MD;  Location: [facility] ENDOSCOPY;  Service: Endoscopy;  Laterality: N/A;   EXCISION MASS ABDOMINAL N/A 05/25/2015   Procedure: ABDOMINAL WALL EXPLORATION EXCISION OF SEROMA REMOVAL OF REDUNDANT SKIN ;  Surgeon: Elspeth Schultze, MD;  Location: [facility];  Service: General;  Laterality: N/A;   EYE SURGERY     Eye laser for vessel hemorrhaging   fybroid removal     HERNIA REPAIR  10/03/2011   ventral hernia repair   INJECTION, INTRAVITREAL, THERAPEUTIC AGENT, EYE Left 01/09/2023   Procedure: Injection, Intravitreal, therapeutic agent, eye;  Surgeon: Tobie Baptist, MD;  Location[facility] OR;  Service: Ophthalmology;  Laterality: Left;   INSERTION OF MESH N/A 02/04/2013   Procedure: INSERTION OF MESH;  Surgeon: Elspeth KYM Schultze, MD;  Location: [facility];  Service: General;  Laterality: N/A;   LEFT HEART CATH AND CORONARY ANGIOGRAPHY N/A 09/05/2020   Procedure: LEFT HEART CATH AND CORONARY ANGIOGRAPHY;  Surgeon: Elmira Newman PARAS, MD;  [facility];  Service: Cardiovascular;  Laterality: N/A;   PARS  PLANA VITRECTOMY Left 08/24/2022   Procedure: PARS PLANA VITRECTOMY 25 GAUGE FOR ENDOPHTHALMITIS; INJECTION OF ANTIBIOTIC;  Surgeon: Tobie Baptist, MD;  Location: [facility] OR;  Service: Ophthalmology;  Laterality: Left;   sciatic nerve fusion  09/01/2023   at surgical center   TRIGGER FINGER RELEASE Left 11/05/2023  Procedure: RELEASE, A1 PULLEY, FOR LEFT TRIGGER FINGER;  Surgeon: Murrell Drivers, MD;  Location: [facility];  Service: Orthopedics;  Laterality: Left;   UMBILICAL HERNIA REPAIR N/A 02/04/2013   Procedure: LAPAROSCOPIC ventral wall hernia repair LAPAROSCOPIC LYSIS OF ADHESIONS laparoscopic exploration of abdomen ;  Surgeon: Elspeth KYM Schultze, MD;  Location: [facility];  Service: General;  Laterality: N/A;   UPPER GASTROINTESTINAL ENDOSCOPY     URETER REVISION     Bilateral twisted   UTERINE FIBROID SURGERY     2 SURGERIES FOR FIBROIDS   VENTRAL HERNIA REPAIR  10/03/2011   Procedure: LAPAROSCOPIC VENTRAL HERNIA;  Surgeon: Elspeth KYM Schultze, MD;  Location: [facility];  Service: General;  Laterality: N/A;   Past Medical History:  Diagnosis Date   Allergy    Anemia    DURING MENSES--HAS HEAVY BLEEDING WITH PERODS   Angioedema 08/13/2020   Anxiety    Back pain, chronic    ongoing   Blood transfusion    IN 2012  AFTER C -SECTION   Cerebral thrombosis with cerebral infarction (HCC) 06/2009   RIGHT SIDED WEAKNESS ( ARM AND LEG ) AND SPASMS-remains with slight weakness and vertigo.   CKD (chronic kidney disease)    Constipation    hx   Dependent on wheelchair    motorized w/c for long distance per pt   Depression    Diabetes mellitus    Diabetic neuropathy (HCC)    BOTH FEET --COMES AND GOES   Edema, lower extremity    Endometriosis    Fatty liver    GERD (gastroesophageal reflux disease)    with pregnancy   H/O eye surgery    Headache(784.0)    MIGRAINES--NOT REALLY HEADACHE-MORE LIKE PRESSURE SENSATION IN HEAD-FEELS DIZZIY AND  FAINT AS THE PRESSURE RESOLVES   Hernia, incisional, RLQ,  s/p lap repair Sep 2013 09/02/2011   History of vertigo 03/22/2018   Hx of migraines 10/19/2011   Hyperlipidemia    Hypertension    Leg pain, right    like bad Charley horse   Multiple food allergies    Panic disorder without agoraphobia    Rash    HANDS, ARMS --STATES HX OF RASH EVER SINCE CHILDBIRTH/PREGNANCY.  STATES THE RASH OFTEN OCCURS WHEN SHE IS REALLY STRESSED.goes and comes-presently left ring finger   Restless leg syndrome    DIAGNOSED BY SLEEP STUDY - PT TOLD SHE DID NOT HAVE SLEEP APNEA   Right rotator cuff tear    PAIN IN RIGHT SHOULDER   SBO (small bowel obstruction) (HCC) 06/03/2012   Shortness of breath    hx   Sleep apnea    does not use CPAP   Spastic hemiplegia affecting dominant side (HCC)    Stomach problems    Stroke (HCC)    Ventral hernia    RIGHT LOWER QUADRANT-CAUSING SOME PAIN   Weakness of right side of body    Ht 5' 6 (1.676 m)   Wt 221 lb (100.2 kg)   LMP  (LMP Unknown)   BMI 35.67 kg/m   Opioid Risk Score:   Fall Risk Score:  `1  Depression screen PHQ 2/9     12/31/2023   12:21 PM 11/07/2022   11:00 AM 09/11/2022    2:54 PM 08/12/2022    2:54 PM 09/03/2021    1:23 PM 07/11/2021   11:20 AM 05/30/2021   10:56 AM  Depression screen PHQ 2/9  Decreased Interest 0 0 0 0 1 0 1  Down, Depressed, Hopeless  0 0 0 0 1 0 1  PHQ - 2 Score 0 0 0 0 2 0 2     Review of Systems  Musculoskeletal:  Positive for back pain.       Left shoulder pain  All other systems reviewed and are negative.      Objective:   Physical Exam  Gaenslens:  Sacral thrust (prone) : Positive bilateral Lateral compression: Negative FABER's: Positive left Distraction (supine): Negative Thigh thrust test: Negative  No tenderness in the lumbar paraspinals no tenderness over the gluteal region or over the greater trochanter of the femur Ambulates without assistive device Manual muscle testing right upper extremity 4/5 at the deltoid bicep tricep grip Right  lower extremity 4/5 in the right hip flexor knee extensor ankle dorsiflexor Left side is 5/5 in the same muscle groups Tone is mildly reduced increased right upper extremity    Assessment & Plan:   Assessment and Plan Assessment & Plan Right sacroiliac disorder post-fusion Post-acute recovery phase with increased pain at incision site, complicated by residual right-sided weakness from prior pontine stroke. - Contact surgeon's office to report persistent pain and possible injury. - Surgeon may order radiographic imaging. - Discussed physical therapy options: SI joint rehabilitation or neurorehabilitation given comorbid conditions - Agreed to contact surgeon for next steps. - Avoid NSAIDs due to renal impairment. - Ordered tramadol  refill.  Left sacroiliac joint pain Intermittent pain, less severe than right, activity-induced, relieved by rest and heat. - Conservative management with rest and heating pad as needed.

## 2024-01-03 DIAGNOSIS — E1122 Type 2 diabetes mellitus with diabetic chronic kidney disease: Secondary | ICD-10-CM | POA: Diagnosis not present

## 2024-01-03 DIAGNOSIS — E1169 Type 2 diabetes mellitus with other specified complication: Secondary | ICD-10-CM | POA: Diagnosis not present

## 2024-01-03 DIAGNOSIS — E1165 Type 2 diabetes mellitus with hyperglycemia: Secondary | ICD-10-CM | POA: Diagnosis not present

## 2024-01-19 ENCOUNTER — Other Ambulatory Visit (HOSPITAL_COMMUNITY): Payer: Self-pay

## 2024-01-19 ENCOUNTER — Encounter: Payer: Self-pay | Admitting: Pharmacist

## 2024-01-19 ENCOUNTER — Other Ambulatory Visit: Payer: Self-pay

## 2024-01-22 ENCOUNTER — Other Ambulatory Visit: Payer: Self-pay

## 2024-01-26 ENCOUNTER — Other Ambulatory Visit: Payer: Self-pay

## 2024-01-26 ENCOUNTER — Ambulatory Visit: Admitting: Podiatry

## 2024-01-26 ENCOUNTER — Other Ambulatory Visit (HOSPITAL_COMMUNITY): Payer: Self-pay

## 2024-01-26 DIAGNOSIS — L03119 Cellulitis of unspecified part of limb: Secondary | ICD-10-CM | POA: Diagnosis not present

## 2024-01-26 DIAGNOSIS — L02619 Cutaneous abscess of unspecified foot: Secondary | ICD-10-CM

## 2024-01-26 MED ORDER — AMOXICILLIN-POT CLAVULANATE 875-125 MG PO TABS: 1.0000 | ORAL_TABLET | Freq: Two times a day (BID) | ORAL | 0 refills | Status: AC

## 2024-01-26 MED ORDER — MUPIROCIN 2 % EX OINT: TOPICAL_OINTMENT | Freq: Two times a day (BID) | CUTANEOUS | Status: AC

## 2024-01-26 NOTE — Progress Notes (Signed)
 Patient presents with complaint plaint of redness bleeding and irritation around the 1st and 5th toe on the left foot.  She says she tends to pick at things a lot and the nails been irritates that she has been picking at them a lot.  Has not noticed any purulence of that she has noticed some clear drainage.  She does notice a blister on the fifth toe developed.  Does not recall any injury to the foot.   Physical exam:  General appearance: Pleasant, and in no acute distress. AOx3.  Vascular: Pedal pulses: DP 2/4 bilaterally, PT 2/4 bilaterally. Minimal edema lower legs bilaterally. Capillary fill time needed bilaterally.  Neurological: Grossly intact bilaterally  Dermatologic:   Redness and irritation around the great toe left with fissuring of the skin and dried blood and some clear drainage present.  End of the nail is very irregular.  Some loose skin.  Blister on the medial aspect of the fifth toe with redness and irritation of the toe extending over the fifth MTP left  Musculoskeletal: Hammertoe fifth toe left    Diagnosis: 1.  Cellulitis left foot.  Plan: -Establish office visit for evaluation and management level 3. - Discussed cellulitis secondary to picking at the hallux and fifth toe.  Told her to try to avoid picking at the nails and the toes as this will make it worse.  Will place her on oral antibiotic and have her do local wound care.  Try to wear sock to keep her from scratching and picking at the toes.  -Wound care: Soak foot lukewarm salt water  twice daily, apply Bactroban  ointment, and a light dressing. -Rx Bactroban  ointment apply twice daily to affected areas.  Refill x 2 - Rx Augmentin  875 mg, 1 p.o. twice daily 10 days  Return 1 week follow-up infection left foot

## 2024-01-26 NOTE — Patient Instructions (Signed)

## 2024-01-29 ENCOUNTER — Other Ambulatory Visit: Payer: Self-pay

## 2024-02-01 ENCOUNTER — Other Ambulatory Visit: Payer: Self-pay

## 2024-02-02 ENCOUNTER — Other Ambulatory Visit (HOSPITAL_COMMUNITY): Payer: Self-pay

## 2024-02-03 ENCOUNTER — Ambulatory Visit: Admitting: Podiatry

## 2024-02-03 ENCOUNTER — Encounter: Payer: Self-pay | Admitting: Podiatry

## 2024-02-03 DIAGNOSIS — L97501 Non-pressure chronic ulcer of other part of unspecified foot limited to breakdown of skin: Secondary | ICD-10-CM

## 2024-02-03 MED ORDER — MUPIROCIN 2 % EX OINT: 1.0000 | TOPICAL_OINTMENT | Freq: Two times a day (BID) | CUTANEOUS | 0 refills | Status: AC

## 2024-02-03 NOTE — Progress Notes (Signed)
 Patient presents for follow-up cellulitis foot left on the hallux and fifth toe right.  Says the fifth toes actually got a little bit worse to the hallux is doing better with no redness.  Says she still picking at them although she is trying to avoid that.  Physical Exam:  Patient alert and oriented x 3.  No complaints of nausea, vomiting, fever, or chills  Vascular: DP pulses 2/4 bilateral. PT pulses 2/4 lateral.  Mild to moderate edema lower extremity. Capillary fill time immediate bilaterally.  Dermatologic: Superficial ulceration penetrating the dermis medial aspect of fifth toe left with some mild breakdown on the lateral aspect and distal aspect of the fifth toe left. Measures 20 mm wide x 10 and mm long x 1 mm deep.  No signs of infection foot.  Hallux wounds appears of healed well with no signs section or skin breakdown  Neurologic:   Musculoskeletal:    Diagnoses: 1.  Superficial ulceration Wagner grade 1 fifth toe left  Plan: - Established office visit for evaluation and management level 3. -She can discontinue wound care on the hallux left.  Continued on the right soaking twice daily lukewarm salt water  applying Bactroban  ointment and a light dressing.  Again urged her to stop picking at the areas as this will make it worse.  Recommended she get a pair of compression hose 20 to 30 mmHg BK as this will help with her swelling and also keep her from picking the toes directly. Lorean debridement with tissue nippers superficial Wagner grade 1 ulceration fifth toe left.  Debrided any devitalized tissue with a tissue nipper.  Applied antibiotic ointment and a light dressing -Rx Bactroban  ointment, apply twice daily to wound, 2 refills    Return 2 f/u ulcer fifth toe left

## 2024-02-10 ENCOUNTER — Telehealth: Payer: Self-pay | Admitting: Neurology

## 2024-02-10 NOTE — Telephone Encounter (Signed)
 LVM / Mychart Message informing pt of reschedule needed. MD OUT

## 2024-02-17 ENCOUNTER — Ambulatory Visit (INDEPENDENT_AMBULATORY_CARE_PROVIDER_SITE_OTHER): Admitting: Podiatry

## 2024-02-17 DIAGNOSIS — L97501 Non-pressure chronic ulcer of other part of unspecified foot limited to breakdown of skin: Secondary | ICD-10-CM

## 2024-02-17 NOTE — Progress Notes (Signed)
 Presents follow-up ulceration fifth toe of the left.  Has been soaking it and applying Bactroban  ointment.  Has noticed some bleeding.  No purulence noted.  Physical Exam:  Patient alert and oriented x 3.  No complaints of nausea, vomiting, fever, or chills  Vascular: DP pulses 2/4 bilateral. PT pulses 1/4 lateral.  Mild to moderate edema lower extremity. Capillary fill time immediate bilaterally.  Dermatologic: Superficial fissure along the sulcus end of the fifth toe left measuring 10 mm x 2 mm x 1 mm deep.  Bleeding at fissure.  No signs of infection.  Some blistering on the lateral aspect of the fifth toe left  Neurologic:   Musculoskeletal: Hammertoes 2 through 5 left   Diagnoses: 1.  Superficial ulceration Wagner grade 1 fifth toe left  Plan: - Established office visit for evaluation and management level 3. -Left continue wound care on the fifth toe.  Continue soaking twice daily with lukewarm salt water  applying Bactroban  ointment and a light gauze dressing.  Again told her not to pick at the area since this will slow up the healing.  -Sharp debridement with tissue nippers superficial Wagner grade 1 ulceration fifth toe left 2.  Debrided any devitalized tissue with a tissue nipper.  Applied antibiotic ointment and a light dressing    Return 2 weeks f/u ulcer fifth toe left

## 2024-02-19 ENCOUNTER — Telehealth: Payer: Self-pay | Admitting: Cardiology

## 2024-02-19 ENCOUNTER — Other Ambulatory Visit (HOSPITAL_BASED_OUTPATIENT_CLINIC_OR_DEPARTMENT_OTHER): Payer: Self-pay

## 2024-02-19 NOTE — Telephone Encounter (Signed)
 Pt c/o medication issue:  1. Name of Medication:   tirzepatide  (MOUNJARO ) 10 MG/0.5ML Pen    2. How are you currently taking this medication (dosage and times per day)? As written  3. Are you having a reaction (difficulty breathing--STAT)? no  4. What is your medication issue? Pt received letter that the medication needs to be re-auth. Also wants to talk to Melissa Maccia about going up on the dosage

## 2024-03-02 ENCOUNTER — Ambulatory Visit: Admitting: Podiatry

## 2024-03-31 ENCOUNTER — Ambulatory Visit: Admitting: Neurology

## 2024-06-30 ENCOUNTER — Encounter: Admitting: Physical Medicine & Rehabilitation

## 2024-11-07 ENCOUNTER — Ambulatory Visit: Admitting: Family Medicine
